# Patient Record
Sex: Female | Born: 1960
Health system: Southern US, Community
[De-identification: ages and names within clinical notes are randomized; demographics above are authoritative.]

## PROBLEM LIST (undated history)

## (undated) DIAGNOSIS — M1990 Unspecified osteoarthritis, unspecified site: Secondary | ICD-10-CM

## (undated) DIAGNOSIS — E041 Nontoxic single thyroid nodule: Secondary | ICD-10-CM

## (undated) DIAGNOSIS — G629 Polyneuropathy, unspecified: Secondary | ICD-10-CM

## (undated) DIAGNOSIS — H269 Unspecified cataract: Secondary | ICD-10-CM

## (undated) DIAGNOSIS — Z905 Acquired absence of kidney: Secondary | ICD-10-CM

## (undated) DIAGNOSIS — I739 Peripheral vascular disease, unspecified: Secondary | ICD-10-CM

## (undated) DIAGNOSIS — T8859XA Other complications of anesthesia, initial encounter: Secondary | ICD-10-CM

## (undated) DIAGNOSIS — Z972 Presence of dental prosthetic device (complete) (partial): Secondary | ICD-10-CM

## (undated) DIAGNOSIS — I509 Heart failure, unspecified: Secondary | ICD-10-CM

## (undated) DIAGNOSIS — C679 Malignant neoplasm of bladder, unspecified: Secondary | ICD-10-CM

## (undated) DIAGNOSIS — I70213 Atherosclerosis of native arteries of extremities with intermittent claudication, bilateral legs: Secondary | ICD-10-CM

## (undated) DIAGNOSIS — I2510 Atherosclerotic heart disease of native coronary artery without angina pectoris: Secondary | ICD-10-CM

## (undated) DIAGNOSIS — G709 Myoneural disorder, unspecified: Secondary | ICD-10-CM

## (undated) DIAGNOSIS — F419 Anxiety disorder, unspecified: Secondary | ICD-10-CM

## (undated) DIAGNOSIS — K649 Unspecified hemorrhoids: Secondary | ICD-10-CM

## (undated) DIAGNOSIS — E559 Vitamin D deficiency, unspecified: Secondary | ICD-10-CM

## (undated) DIAGNOSIS — K219 Gastro-esophageal reflux disease without esophagitis: Secondary | ICD-10-CM

## (undated) DIAGNOSIS — I10 Essential (primary) hypertension: Secondary | ICD-10-CM

## (undated) DIAGNOSIS — C649 Malignant neoplasm of unspecified kidney, except renal pelvis: Secondary | ICD-10-CM

## (undated) DIAGNOSIS — Z8489 Family history of other specified conditions: Secondary | ICD-10-CM

## (undated) DIAGNOSIS — R011 Cardiac murmur, unspecified: Secondary | ICD-10-CM

## (undated) HISTORY — PX: CARDIAC CATHETERIZATION: SHX172

## (undated) HISTORY — PX: TUBAL LIGATION: SHX77

## (undated) HISTORY — DX: Essential (primary) hypertension: I10

## (undated) HISTORY — DX: Vitamin D deficiency, unspecified: E55.9

## (undated) HISTORY — PX: AMPUTATION TOE: SHX6595

## (undated) HISTORY — DX: Peripheral vascular disease, unspecified: I73.9

## (undated) HISTORY — PX: EYE SURGERY: SHX253

## (undated) HISTORY — DX: Unspecified osteoarthritis, unspecified site: M19.90

## (undated) HISTORY — DX: Atherosclerosis of native arteries of extremities with intermittent claudication, bilateral legs: I70.213

## (undated) HISTORY — DX: Myoneural disorder, unspecified: G70.9

## (undated) HISTORY — PX: ARTERIAL BYPASS SURGRY: SHX557

## (undated) HISTORY — DX: Malignant neoplasm of unspecified kidney, except renal pelvis: C64.9

## (undated) HISTORY — DX: Unspecified cataract: H26.9

## (undated) HISTORY — DX: Unspecified hemorrhoids: K64.9

---

## 1999-01-25 LAB — HM DIABETES EYE EXAM

## 2005-07-07 DIAGNOSIS — Z8614 Personal history of Methicillin resistant Staphylococcus aureus infection: Secondary | ICD-10-CM

## 2005-07-07 HISTORY — DX: Personal history of Methicillin resistant Staphylococcus aureus infection: Z86.14

## 2007-07-08 HISTORY — PX: CORONARY ARTERY BYPASS GRAFT: SHX141

## 2008-07-13 ENCOUNTER — Ambulatory Visit: Payer: Self-pay | Admitting: Specialist

## 2008-07-17 ENCOUNTER — Ambulatory Visit: Payer: Self-pay | Admitting: Specialist

## 2008-07-19 ENCOUNTER — Encounter: Payer: Self-pay | Admitting: Internal Medicine

## 2008-07-25 ENCOUNTER — Ambulatory Visit: Payer: Self-pay | Admitting: Internal Medicine

## 2008-07-25 ENCOUNTER — Ambulatory Visit: Payer: Self-pay | Admitting: Specialist

## 2008-08-07 ENCOUNTER — Encounter: Payer: Self-pay | Admitting: Internal Medicine

## 2008-08-14 ENCOUNTER — Ambulatory Visit: Payer: Self-pay | Admitting: Family

## 2008-09-01 ENCOUNTER — Ambulatory Visit: Payer: Self-pay | Admitting: Specialist

## 2008-09-04 ENCOUNTER — Ambulatory Visit: Payer: Self-pay | Admitting: Family

## 2008-09-21 ENCOUNTER — Ambulatory Visit: Payer: Self-pay | Admitting: Family

## 2008-10-05 ENCOUNTER — Ambulatory Visit: Payer: Self-pay | Admitting: Family

## 2008-12-05 ENCOUNTER — Ambulatory Visit: Payer: Self-pay | Admitting: Family

## 2009-09-04 ENCOUNTER — Ambulatory Visit: Payer: Self-pay | Admitting: Vascular Surgery

## 2009-09-05 ENCOUNTER — Ambulatory Visit: Payer: Self-pay | Admitting: Surgery

## 2009-09-10 ENCOUNTER — Ambulatory Visit: Payer: Self-pay | Admitting: Surgery

## 2009-10-11 ENCOUNTER — Ambulatory Visit: Payer: Self-pay | Admitting: Vascular Surgery

## 2010-04-02 ENCOUNTER — Ambulatory Visit: Payer: Self-pay | Admitting: Vascular Surgery

## 2010-04-09 LAB — CBC AND DIFFERENTIAL
HCT: 37 % (ref 36–46)
Hemoglobin: 12.5 g/dL (ref 12.0–16.0)
Neutrophils Absolute: 7 /uL
Platelets: 345 10*3/uL (ref 150–399)

## 2010-04-23 ENCOUNTER — Ambulatory Visit: Payer: Self-pay | Admitting: Vascular Surgery

## 2010-05-28 ENCOUNTER — Ambulatory Visit: Payer: Self-pay | Admitting: Vascular Surgery

## 2010-06-04 ENCOUNTER — Ambulatory Visit: Payer: Self-pay | Admitting: Internal Medicine

## 2011-02-25 ENCOUNTER — Telehealth: Payer: Self-pay | Admitting: Internal Medicine

## 2011-02-25 NOTE — Telephone Encounter (Signed)
I called patient and verified that she test her blood sugar 2-3 times a day.  I have also called [facility] pharmacy to notify them.

## 2011-03-14 ENCOUNTER — Other Ambulatory Visit: Payer: Self-pay | Admitting: Internal Medicine

## 2011-03-18 ENCOUNTER — Other Ambulatory Visit: Payer: Self-pay | Admitting: Internal Medicine

## 2011-04-14 ENCOUNTER — Other Ambulatory Visit: Payer: Self-pay | Admitting: Internal Medicine

## 2011-04-29 ENCOUNTER — Ambulatory Visit (INDEPENDENT_AMBULATORY_CARE_PROVIDER_SITE_OTHER): Payer: Medicare Other | Admitting: Internal Medicine

## 2011-04-29 ENCOUNTER — Encounter: Payer: Self-pay | Admitting: Internal Medicine

## 2011-04-29 VITALS — BP 176/80 | HR 81 | Temp 98.2°F | Resp 16 | Ht 66.0 in | Wt 175.8 lb

## 2011-04-29 DIAGNOSIS — E1151 Type 2 diabetes mellitus with diabetic peripheral angiopathy without gangrene: Secondary | ICD-10-CM | POA: Insufficient documentation

## 2011-04-29 DIAGNOSIS — Z720 Tobacco use: Secondary | ICD-10-CM

## 2011-04-29 DIAGNOSIS — I739 Peripheral vascular disease, unspecified: Secondary | ICD-10-CM

## 2011-04-29 DIAGNOSIS — I10 Essential (primary) hypertension: Secondary | ICD-10-CM | POA: Insufficient documentation

## 2011-04-29 DIAGNOSIS — E785 Hyperlipidemia, unspecified: Secondary | ICD-10-CM

## 2011-04-29 DIAGNOSIS — Z1211 Encounter for screening for malignant neoplasm of colon: Secondary | ICD-10-CM

## 2011-04-29 DIAGNOSIS — E08311 Diabetes mellitus due to underlying condition with unspecified diabetic retinopathy with macular edema: Secondary | ICD-10-CM

## 2011-04-29 DIAGNOSIS — E119 Type 2 diabetes mellitus without complications: Secondary | ICD-10-CM

## 2011-04-29 DIAGNOSIS — K649 Unspecified hemorrhoids: Secondary | ICD-10-CM | POA: Insufficient documentation

## 2011-04-29 DIAGNOSIS — Z87891 Personal history of nicotine dependence: Secondary | ICD-10-CM | POA: Insufficient documentation

## 2011-04-29 MED ORDER — INSULIN PEN NEEDLE 31G X 8 MM MISC: Status: DC

## 2011-04-29 MED ORDER — CARVEDILOL 6.25 MG PO TABS: 6.2500 mg | ORAL_TABLET | Freq: Two times a day (BID) | ORAL | Status: DC

## 2011-04-29 MED ORDER — INSULIN NPH ISOPHANE & REGULAR (70-30) 100 UNIT/ML ~~LOC~~ SUSP: 10.0000 [IU] | Freq: Two times a day (BID) | SUBCUTANEOUS | Status: DC

## 2011-04-29 NOTE — Assessment & Plan Note (Addendum)
S/p laser treatments and injectiosn by [facility]eller,  F.u nov 16th.  Sees Dr Al Corpus for podiatry oct 31 /  Regimen changed to 70/30 bid .  Continue levemir.

## 2011-04-29 NOTE — Progress Notes (Signed)
  Subjective:    Patient ID: Laura Reed, female    DOB: September 10, 1960, 50 y.o.   MRN: [id number]  HPI  50 yo white female with history of PAD, CAD m tobacco abuse and DM here for diabetes check. Since she stopped smoking she has been eating more and her sugars have become uncontrolled.   She has been using regular insulin tid and daily levemir and has had sugars ranging from 140 to 180.     Current Outpatient Prescriptions on File Prior to Visit  Medication Sig Dispense Refill  . ALPRAZolam (XANAX) 0.5 MG tablet TAKE 1 TABLET BY MOUTH DAILY AS NEEDED FOR ANXIETY  30 tablet  3  . atorvastatin (LIPITOR) 40 MG tablet TAKE ONE TABLET BY MOUTH DAILY  30 tablet  3  . clopidogrel (PLAVIX) 75 MG tablet TAKE 1 TABLET BY MOUTH EVERY DAY  30 tablet  6  . lisinopril (PRINIVIL,ZESTRIL) 5 MG tablet TAKE 1 TABLET BY MOUTH DAILY  30 tablet  5   .    Review of Systems     Objective:   Physical Exam        Assessment & Plan:

## 2011-04-29 NOTE — Patient Instructions (Addendum)
We are changing your insulin from regular to 70/30 to reduce the number of injections.  Please follow this insulin schedule:  Breakfast:  10 units of 70/30.  Add 5 units for fasting bs > 150 OR a breakfast of cereal/pancakes/muffins/bagels   Dinner :  10 units of 70/30.  Add 5 units for pre dinner sugar > 150 OR  DINNER CONTAINING PASTA, OR POTATOES  Continue the Levemir 30 units at bedtime.    Look for Laura Reed's brand of pita bread and flatbread at [employer] and at [facility]  4 net carbs/serving!!

## 2011-04-30 ENCOUNTER — Encounter: Payer: Self-pay | Admitting: Internal Medicine

## 2011-04-30 DIAGNOSIS — I739 Peripheral vascular disease, unspecified: Secondary | ICD-10-CM | POA: Insufficient documentation

## 2011-04-30 NOTE — Assessment & Plan Note (Signed)
She quit again in September after a brief relapse.  Encouragement given , risks of recurrent PAD again reiterated.

## 2011-04-30 NOTE — Assessment & Plan Note (Signed)
With prior vascular intervention bu Dr. Earnestine Leys. . She is being referred to Dr. Gilda Crease for followup.

## 2011-05-08 ENCOUNTER — Other Ambulatory Visit (INDEPENDENT_AMBULATORY_CARE_PROVIDER_SITE_OTHER): Payer: Medicare Other | Admitting: *Deleted

## 2011-05-08 DIAGNOSIS — E785 Hyperlipidemia, unspecified: Secondary | ICD-10-CM

## 2011-05-08 DIAGNOSIS — E119 Type 2 diabetes mellitus without complications: Secondary | ICD-10-CM

## 2011-05-08 LAB — MICROALBUMIN / CREATININE URINE RATIO: Microalb Creat Ratio: 4.5 mg/g (ref 0.0–30.0)

## 2011-05-08 LAB — COMPREHENSIVE METABOLIC PANEL
AST: 15 U/L (ref 0–37)
Albumin: 3.6 g/dL (ref 3.5–5.2)
BUN: 21 mg/dL (ref 6–23)
CO2: 27 mEq/L (ref 19–32)
Calcium: 9 mg/dL (ref 8.4–10.5)
Chloride: 102 mEq/L (ref 96–112)
Creatinine, Ser: 0.8 mg/dL (ref 0.4–1.2)
GFR: 78.72 mL/min (ref >60.00)
Glucose, Bld: 273 mg/dL — ABNORMAL HIGH (ref 70–99)
Potassium: 4.5 mEq/L (ref 3.5–5.1)

## 2011-05-08 LAB — LIPID PANEL
Cholesterol: 128 mg/dL (ref 0–200)
Triglycerides: 134 mg/dL (ref 0.0–149.0)

## 2011-05-08 LAB — HEMOGLOBIN A1C: Hgb A1c MFr Bld: 10.7 % — ABNORMAL HIGH (ref 4.6–6.5)

## 2011-05-20 ENCOUNTER — Telehealth: Payer: Self-pay | Admitting: Internal Medicine

## 2011-05-20 NOTE — Telephone Encounter (Signed)
Chart opened in error

## 2011-07-10 ENCOUNTER — Ambulatory Visit: Payer: Self-pay | Admitting: Internal Medicine

## 2011-07-14 ENCOUNTER — Telehealth: Payer: Self-pay | Admitting: Internal Medicine

## 2011-07-14 NOTE — Telephone Encounter (Signed)
Patient was referred to Dr. Vertis Kelch for a black hole in bottom of foot patient is a diabetic .Patient was seen on 1.7.13 .

## 2011-07-17 LAB — HM MAMMOGRAPHY: HM Mammogram: NORMAL

## 2011-07-18 ENCOUNTER — Inpatient Hospital Stay: Payer: Self-pay | Admitting: Vascular Surgery

## 2011-07-18 LAB — BASIC METABOLIC PANEL
Calcium, Total: 8.8 mg/dL (ref 8.5–10.1)
Chloride: 103 mmol/L (ref 98–107)
Co2: 29 mmol/L (ref 21–32)
EGFR (African American): >60
Glucose: 168 mg/dL — ABNORMAL HIGH (ref 65–99)
Osmolality: 288 (ref 275–301)
Potassium: 5 mmol/L (ref 3.5–5.1)
Sodium: 140 mmol/L (ref 136–145)

## 2011-07-19 LAB — CBC WITH DIFFERENTIAL/PLATELET
Basophil #: 0 10*3/uL (ref 0.0–0.1)
Eosinophil #: 0.2 10*3/uL (ref 0.0–0.7)
HCT: 32.5 % — ABNORMAL LOW (ref 35.0–47.0)
Lymphocyte #: 1.6 10*3/uL (ref 1.0–3.6)
MCH: 29.6 pg (ref 26.0–34.0)
MCHC: 34 g/dL (ref 32.0–36.0)
MCV: 87 fL (ref 80–100)
Neutrophil #: 11.5 10*3/uL — ABNORMAL HIGH (ref 1.4–6.5)
Neutrophil %: 78.4 %
RBC: 3.74 10*6/uL — ABNORMAL LOW (ref 3.80–5.20)
RDW: 12.8 % (ref 11.5–14.5)
WBC: 14.6 10*3/uL — ABNORMAL HIGH (ref 3.6–11.0)

## 2011-07-21 LAB — CBC WITH DIFFERENTIAL/PLATELET
Basophil #: 0.1 10*3/uL (ref 0.0–0.1)
Eosinophil #: 0.2 10*3/uL (ref 0.0–0.7)
HGB: 10.8 g/dL — ABNORMAL LOW (ref 12.0–16.0)
Lymphocyte #: 2.3 10*3/uL (ref 1.0–3.6)
Lymphocyte %: 17.5 %
MCHC: 32.9 g/dL (ref 32.0–36.0)
MCV: 88 fL (ref 80–100)
Monocyte #: 1.4 10*3/uL — ABNORMAL HIGH (ref 0.0–0.7)
Monocyte %: 10.4 %
Platelet: 336 10*3/uL (ref 150–440)
RBC: 3.74 10*6/uL — ABNORMAL LOW (ref 3.80–5.20)
RDW: 13 % (ref 11.5–14.5)

## 2011-07-21 LAB — HEMOGLOBIN A1C: Hemoglobin A1C: 11.2 % — ABNORMAL HIGH (ref 4.2–6.3)

## 2011-07-22 LAB — BASIC METABOLIC PANEL
BUN: 11 mg/dL (ref 7–18)
Chloride: 103 mmol/L (ref 98–107)
EGFR (African American): >60
EGFR (Non-African Amer.): >60
Glucose: 86 mg/dL (ref 65–99)
Potassium: 4.2 mmol/L (ref 3.5–5.1)
Sodium: 143 mmol/L (ref 136–145)

## 2011-07-22 LAB — CBC WITH DIFFERENTIAL/PLATELET
Basophil %: 0.7 %
Eosinophil %: 1.8 %
HGB: 10.9 g/dL — ABNORMAL LOW (ref 12.0–16.0)
Lymphocyte #: 2 10*3/uL (ref 1.0–3.6)
Lymphocyte %: 17.7 %
MCH: 28.9 pg (ref 26.0–34.0)
MCV: 86 fL (ref 80–100)
Monocyte #: 1.1 10*3/uL — ABNORMAL HIGH (ref 0.0–0.7)
Monocyte %: 9.4 %
Neutrophil #: 8.1 10*3/uL — ABNORMAL HIGH (ref 1.4–6.5)
Neutrophil %: 70.4 %
Platelet: 353 10*3/uL (ref 150–440)
RBC: 3.76 10*6/uL — ABNORMAL LOW (ref 3.80–5.20)
WBC: 11.5 10*3/uL — ABNORMAL HIGH (ref 3.6–11.0)

## 2011-07-23 ENCOUNTER — Encounter: Payer: Self-pay | Admitting: Internal Medicine

## 2011-07-24 LAB — CBC WITH DIFFERENTIAL/PLATELET
Basophil %: 0.5 %
Eosinophil #: 0.2 10*3/uL (ref 0.0–0.7)
Eosinophil %: 1.7 %
HGB: 11.2 g/dL — ABNORMAL LOW (ref 12.0–16.0)
Lymphocyte #: 1.4 10*3/uL (ref 1.0–3.6)
MCH: 29 pg (ref 26.0–34.0)
MCHC: 33.2 g/dL (ref 32.0–36.0)
Monocyte #: 0.8 10*3/uL — ABNORMAL HIGH (ref 0.0–0.7)
Neutrophil %: 77.2 %
RBC: 3.87 10*6/uL (ref 3.80–5.20)
RDW: 12.9 % (ref 11.5–14.5)

## 2011-07-25 LAB — CBC WITH DIFFERENTIAL/PLATELET
Basophil #: 0 10*3/uL (ref 0.0–0.1)
Basophil %: 0.4 %
Eosinophil #: 0.2 10*3/uL (ref 0.0–0.7)
Eosinophil %: 2 %
HGB: 11.3 g/dL — ABNORMAL LOW (ref 12.0–16.0)
Lymphocyte #: 1.6 10*3/uL (ref 1.0–3.6)
Lymphocyte %: 15.6 %
MCH: 28.8 pg (ref 26.0–34.0)
MCHC: 33.5 g/dL (ref 32.0–36.0)
MCV: 86 fL (ref 80–100)
Monocyte #: 0.9 10*3/uL — ABNORMAL HIGH (ref 0.0–0.7)
Neutrophil %: 73.6 %
RBC: 3.93 10*6/uL (ref 3.80–5.20)

## 2011-07-26 LAB — CBC WITH DIFFERENTIAL/PLATELET
Basophil #: 0 10*3/uL (ref 0.0–0.1)
Eosinophil #: 0.1 10*3/uL (ref 0.0–0.7)
Eosinophil %: 1 %
HCT: 28.8 % — ABNORMAL LOW (ref 35.0–47.0)
Lymphocyte %: 10.8 %
MCHC: 33.6 g/dL (ref 32.0–36.0)
Monocyte %: 9.4 %
Neutrophil #: 9.6 10*3/uL — ABNORMAL HIGH (ref 1.4–6.5)
Platelet: 359 10*3/uL (ref 150–440)
RBC: 3.3 10*6/uL — ABNORMAL LOW (ref 3.80–5.20)
RDW: 12.9 % (ref 11.5–14.5)

## 2011-07-26 LAB — BASIC METABOLIC PANEL
Anion Gap: 6 — ABNORMAL LOW (ref 7–16)
Chloride: 102 mmol/L (ref 98–107)
EGFR (African American): >60
Glucose: 166 mg/dL — ABNORMAL HIGH (ref 65–99)
Osmolality: 278 (ref 275–301)
Sodium: 137 mmol/L (ref 136–145)

## 2011-07-26 LAB — APTT: Activated PTT: 33.1 secs (ref 23.6–35.9)

## 2011-07-27 LAB — CBC WITH DIFFERENTIAL/PLATELET
Basophil #: 0.1 10*3/uL (ref 0.0–0.1)
Eosinophil %: 0.9 %
HGB: 8.1 g/dL — ABNORMAL LOW (ref 12.0–16.0)
Lymphocyte #: 1.2 10*3/uL (ref 1.0–3.6)
Monocyte #: 1.7 10*3/uL — ABNORMAL HIGH (ref 0.0–0.7)
Monocyte %: 11.6 %
Neutrophil #: 11.3 10*3/uL — ABNORMAL HIGH (ref 1.4–6.5)
Platelet: 294 10*3/uL (ref 150–440)
RBC: 2.79 10*6/uL — ABNORMAL LOW (ref 3.80–5.20)
RDW: 12.9 % (ref 11.5–14.5)
WBC: 14.4 10*3/uL — ABNORMAL HIGH (ref 3.6–11.0)

## 2011-07-27 LAB — BASIC METABOLIC PANEL
Calcium, Total: 7.9 mg/dL — ABNORMAL LOW (ref 8.5–10.1)
Chloride: 101 mmol/L (ref 98–107)
Co2: 28 mmol/L (ref 21–32)
Creatinine: 1.17 mg/dL (ref 0.60–1.30)
EGFR (Non-African Amer.): 52 — ABNORMAL LOW
Osmolality: 285 (ref 275–301)
Potassium: 4.9 mmol/L (ref 3.5–5.1)
Sodium: 139 mmol/L (ref 136–145)

## 2011-07-28 LAB — CBC WITH DIFFERENTIAL/PLATELET
Basophil #: 0.1 10*3/uL (ref 0.0–0.1)
Basophil %: 0.4 %
Eosinophil #: 0.2 10*3/uL (ref 0.0–0.7)
MCH: 29.5 pg (ref 26.0–34.0)
MCHC: 34.3 g/dL (ref 32.0–36.0)
Monocyte #: 1.4 10*3/uL — ABNORMAL HIGH (ref 0.0–0.7)
Monocyte %: 10.1 %
Neutrophil #: 10.7 10*3/uL — ABNORMAL HIGH (ref 1.4–6.5)
Neutrophil %: 77.5 %
Platelet: 311 10*3/uL (ref 150–440)
RBC: 2.84 10*6/uL — ABNORMAL LOW (ref 3.80–5.20)
RDW: 13.1 % (ref 11.5–14.5)

## 2011-07-28 LAB — BASIC METABOLIC PANEL
BUN: 24 mg/dL — ABNORMAL HIGH (ref 7–18)
Calcium, Total: 8.5 mg/dL (ref 8.5–10.1)
Chloride: 97 mmol/L — ABNORMAL LOW (ref 98–107)
Osmolality: 271 (ref 275–301)
Potassium: 4.4 mmol/L (ref 3.5–5.1)

## 2011-08-13 ENCOUNTER — Telehealth: Payer: Self-pay | Admitting: *Deleted

## 2011-08-13 NOTE — Telephone Encounter (Signed)
Prior Laura Reed is needed for levemir and novolog pens, forms are in red folder.

## 2011-08-14 ENCOUNTER — Emergency Department: Payer: Self-pay | Admitting: Emergency Medicine

## 2011-08-14 LAB — COMPREHENSIVE METABOLIC PANEL
Anion Gap: 9 (ref 7–16)
BUN: 47 mg/dL — ABNORMAL HIGH (ref 7–18)
Bilirubin,Total: 0.2 mg/dL (ref 0.2–1.0)
Calcium, Total: 8.9 mg/dL (ref 8.5–10.1)
Chloride: 104 mmol/L (ref 98–107)
EGFR (African American): >60
Glucose: 32 mg/dL — CL (ref 65–99)
Potassium: 4.3 mmol/L (ref 3.5–5.1)
SGOT(AST): 18 U/L (ref 15–37)
SGPT (ALT): 25 U/L
Total Protein: 7.6 g/dL (ref 6.4–8.2)

## 2011-08-14 LAB — CBC
HCT: 30.2 % — ABNORMAL LOW (ref 35.0–47.0)
MCH: 29.3 pg (ref 26.0–34.0)
MCV: 87 fL (ref 80–100)
RBC: 3.49 10*6/uL — ABNORMAL LOW (ref 3.80–5.20)
RDW: 13.7 % (ref 11.5–14.5)
WBC: 14.8 10*3/uL — ABNORMAL HIGH (ref 3.6–11.0)

## 2011-08-14 LAB — URINALYSIS, COMPLETE
Bacteria: NONE SEEN
Bilirubin,UR: NEGATIVE
Glucose,UR: 50 mg/dL (ref 0–75)
Ketone: NEGATIVE
Nitrite: NEGATIVE
RBC,UR: 1 /HPF (ref 0–5)
Squamous Epithelial: 2

## 2011-08-14 LAB — TROPONIN I: Troponin-I: <0.02 ng/mL

## 2011-08-18 ENCOUNTER — Other Ambulatory Visit: Payer: Self-pay | Admitting: *Deleted

## 2011-08-18 MED ORDER — GLUCOSE BLOOD VI STRP: ORAL_STRIP | Status: DC

## 2011-08-20 ENCOUNTER — Telehealth: Payer: Self-pay | Admitting: *Deleted

## 2011-08-20 NOTE — Telephone Encounter (Signed)
To: [employer] (Daytime Triage) Fax: [phone] From: [facility] Date/ Time: 08/20/2011 10:15 AM Taken By: Annalee Genta, CSR Caller: Eber Jones Facility: not collected Patient: Laura Reed, Laura Reed DOB: [birth year] Phone: [phone] Reason for Call: Pt requests nurse call [facility] office at #[phone] re form for test strips. She advises they should have faxed form to office. Please call pt with any questions. Regarding Appointment: Appt Date: Appt Time: Unknown Provider: Reason: Details: Outcome:

## 2011-08-20 NOTE — Telephone Encounter (Signed)
Forms received from insurance company stating prior auths arent needed on insulin pens at this time.

## 2011-08-22 ENCOUNTER — Other Ambulatory Visit: Payer: Self-pay | Admitting: Internal Medicine

## 2011-08-26 ENCOUNTER — Telehealth: Payer: Self-pay | Admitting: *Deleted

## 2011-08-26 NOTE — Telephone Encounter (Signed)
[facility] has faxed a form for patient's diabetic supplies, form is in red folder.

## 2011-08-27 NOTE — Telephone Encounter (Signed)
Form completed, faxed back to [facility].

## 2011-08-28 NOTE — Telephone Encounter (Signed)
We have received form. See other phone note.

## 2011-09-03 ENCOUNTER — Other Ambulatory Visit: Payer: Self-pay | Admitting: Internal Medicine

## 2011-09-03 NOTE — Telephone Encounter (Signed)
thr refill request had a duplicate alprazolam request which I did not want to authorize, just one refill .  The atorvastatin can be refilled

## 2011-09-11 ENCOUNTER — Encounter: Payer: Self-pay | Admitting: Internal Medicine

## 2011-09-12 ENCOUNTER — Other Ambulatory Visit: Payer: Self-pay | Admitting: *Deleted

## 2011-09-12 MED ORDER — DIGOXIN 125 MCG PO TABS: 125.0000 ug | ORAL_TABLET | Freq: Every day | ORAL | Status: DC

## 2011-10-02 ENCOUNTER — Other Ambulatory Visit: Payer: Self-pay | Admitting: Internal Medicine

## 2011-10-02 ENCOUNTER — Other Ambulatory Visit: Payer: Self-pay | Admitting: *Deleted

## 2011-10-02 MED ORDER — LISINOPRIL 5 MG PO TABS: 5.0000 mg | ORAL_TABLET | Freq: Every day | ORAL | Status: DC

## 2011-10-09 ENCOUNTER — Encounter: Payer: Self-pay | Admitting: Internal Medicine

## 2011-10-30 ENCOUNTER — Other Ambulatory Visit: Payer: Self-pay | Admitting: Internal Medicine

## 2011-10-30 MED ORDER — LANCETS MISC: Status: DC

## 2011-11-05 ENCOUNTER — Other Ambulatory Visit: Payer: Self-pay | Admitting: Internal Medicine

## 2011-11-06 NOTE — Telephone Encounter (Signed)
Done

## 2011-11-17 ENCOUNTER — Other Ambulatory Visit: Payer: Self-pay | Admitting: Internal Medicine

## 2011-11-17 DIAGNOSIS — I10 Essential (primary) hypertension: Secondary | ICD-10-CM

## 2011-11-17 MED ORDER — CARVEDILOL 6.25 MG PO TABS: 6.2500 mg | ORAL_TABLET | Freq: Two times a day (BID) | ORAL | Status: DC

## 2011-11-27 ENCOUNTER — Ambulatory Visit: Payer: Self-pay | Admitting: Physician Assistant

## 2011-11-27 LAB — CREATININE, SERUM
Creatinine: 0.75 mg/dL (ref 0.60–1.30)
EGFR (African American): >60

## 2011-12-09 ENCOUNTER — Ambulatory Visit: Payer: Self-pay | Admitting: Vascular Surgery

## 2011-12-09 LAB — BASIC METABOLIC PANEL
BUN: 34 mg/dL — ABNORMAL HIGH (ref 7–18)
Calcium, Total: 8.5 mg/dL (ref 8.5–10.1)
Creatinine: 0.88 mg/dL (ref 0.60–1.30)
EGFR (African American): >60
EGFR (Non-African Amer.): >60
Potassium: 4.1 mmol/L (ref 3.5–5.1)
Sodium: 141 mmol/L (ref 136–145)

## 2011-12-15 ENCOUNTER — Other Ambulatory Visit: Payer: Self-pay | Admitting: Internal Medicine

## 2011-12-15 DIAGNOSIS — Z1211 Encounter for screening for malignant neoplasm of colon: Secondary | ICD-10-CM

## 2011-12-17 ENCOUNTER — Encounter: Payer: Self-pay | Admitting: Internal Medicine

## 2011-12-17 ENCOUNTER — Other Ambulatory Visit: Payer: Self-pay | Admitting: Internal Medicine

## 2011-12-18 NOTE — Telephone Encounter (Signed)
Rx called to [facility] pharmacy. 

## 2011-12-19 ENCOUNTER — Ambulatory Visit: Payer: Self-pay | Admitting: General Surgery

## 2011-12-23 ENCOUNTER — Ambulatory Visit: Payer: Self-pay | Admitting: Vascular Surgery

## 2011-12-23 LAB — CBC
HGB: 12.9 g/dL (ref 12.0–16.0)
MCH: 28.1 pg (ref 26.0–34.0)
MCHC: 33.3 g/dL (ref 32.0–36.0)
MCV: 85 fL (ref 80–100)
Platelet: 295 10*3/uL (ref 150–440)
RBC: 4.57 10*6/uL (ref 3.80–5.20)
WBC: 11.4 10*3/uL — ABNORMAL HIGH (ref 3.6–11.0)

## 2011-12-23 LAB — BASIC METABOLIC PANEL
Anion Gap: 4 — ABNORMAL LOW (ref 7–16)
Calcium, Total: 8.9 mg/dL (ref 8.5–10.1)
Chloride: 104 mmol/L (ref 98–107)
EGFR (African American): >60
EGFR (Non-African Amer.): >60
Potassium: 4.6 mmol/L (ref 3.5–5.1)
Sodium: 137 mmol/L (ref 136–145)

## 2011-12-31 ENCOUNTER — Inpatient Hospital Stay: Payer: Self-pay | Admitting: Vascular Surgery

## 2011-12-31 ENCOUNTER — Ambulatory Visit: Payer: Medicare Other | Admitting: Internal Medicine

## 2012-01-01 LAB — PROTIME-INR: INR: 1

## 2012-01-01 LAB — CBC WITH DIFFERENTIAL/PLATELET
Basophil %: 0.5 %
Eosinophil %: 1 %
HCT: 33.2 % — ABNORMAL LOW (ref 35.0–47.0)
Lymphocyte #: 1.8 10*3/uL (ref 1.0–3.6)
MCH: 28.5 pg (ref 26.0–34.0)
MCV: 85 fL (ref 80–100)
Monocyte %: 7.3 %
Neutrophil #: 11.3 10*3/uL — ABNORMAL HIGH (ref 1.4–6.5)
RBC: 3.9 10*6/uL (ref 3.80–5.20)
RDW: 13.8 % (ref 11.5–14.5)

## 2012-01-01 LAB — APTT: Activated PTT: 26.2 secs (ref 23.6–35.9)

## 2012-01-01 LAB — BASIC METABOLIC PANEL
Calcium, Total: 8.1 mg/dL — ABNORMAL LOW (ref 8.5–10.1)
Chloride: 109 mmol/L — ABNORMAL HIGH (ref 98–107)
Co2: 27 mmol/L (ref 21–32)
Creatinine: 0.61 mg/dL (ref 0.60–1.30)
EGFR (African American): >60
EGFR (Non-African Amer.): >60
Glucose: 129 mg/dL — ABNORMAL HIGH (ref 65–99)
Osmolality: 289 (ref 275–301)
Potassium: 4.3 mmol/L (ref 3.5–5.1)
Sodium: 143 mmol/L (ref 136–145)

## 2012-01-02 LAB — BASIC METABOLIC PANEL
Anion Gap: 5 — ABNORMAL LOW (ref 7–16)
BUN: 16 mg/dL (ref 7–18)
Calcium, Total: 8.1 mg/dL — ABNORMAL LOW (ref 8.5–10.1)
Creatinine: 0.72 mg/dL (ref 0.60–1.30)
EGFR (African American): >60
EGFR (Non-African Amer.): >60

## 2012-01-02 LAB — CBC WITH DIFFERENTIAL/PLATELET
Basophil #: 0 10*3/uL (ref 0.0–0.1)
Basophil %: 0.2 %
Eosinophil %: 1.3 %
Lymphocyte %: 11.5 %
MCHC: 33.1 g/dL (ref 32.0–36.0)
Monocyte %: 9.5 %
Neutrophil %: 77.5 %
Platelet: 237 10*3/uL (ref 150–440)
WBC: 13.1 10*3/uL — ABNORMAL HIGH (ref 3.6–11.0)

## 2012-01-02 LAB — PATHOLOGY REPORT

## 2012-01-04 ENCOUNTER — Other Ambulatory Visit: Payer: Self-pay | Admitting: Internal Medicine

## 2012-01-06 ENCOUNTER — Other Ambulatory Visit: Payer: Self-pay | Admitting: *Deleted

## 2012-01-06 MED ORDER — LISINOPRIL 5 MG PO TABS: 5.0000 mg | ORAL_TABLET | Freq: Every day | ORAL | Status: DC

## 2012-01-19 ENCOUNTER — Other Ambulatory Visit: Payer: Self-pay | Admitting: Internal Medicine

## 2012-01-20 NOTE — Telephone Encounter (Signed)
Rx called to [facility] pharmacy. 

## 2012-02-09 ENCOUNTER — Other Ambulatory Visit: Payer: Self-pay | Admitting: *Deleted

## 2012-02-09 MED ORDER — INSULIN DETEMIR 100 UNIT/ML ~~LOC~~ SOLN: 30.0000 [IU] | Freq: Every day | SUBCUTANEOUS | Status: DC

## 2012-02-24 ENCOUNTER — Other Ambulatory Visit: Payer: Self-pay | Admitting: Internal Medicine

## 2012-02-24 DIAGNOSIS — I10 Essential (primary) hypertension: Secondary | ICD-10-CM

## 2012-02-24 MED ORDER — CARVEDILOL 6.25 MG PO TABS: 6.2500 mg | ORAL_TABLET | Freq: Two times a day (BID) | ORAL | Status: DC

## 2012-03-03 HISTORY — PX: CHOLECYSTECTOMY: SHX55

## 2012-03-16 ENCOUNTER — Encounter: Payer: Self-pay | Admitting: Internal Medicine

## 2012-03-16 ENCOUNTER — Ambulatory Visit (INDEPENDENT_AMBULATORY_CARE_PROVIDER_SITE_OTHER): Payer: Medicare Other | Admitting: Internal Medicine

## 2012-03-16 VITALS — BP 130/60 | HR 80 | Temp 97.7°F | Resp 16 | Wt 167.5 lb

## 2012-03-16 DIAGNOSIS — E1139 Type 2 diabetes mellitus with other diabetic ophthalmic complication: Secondary | ICD-10-CM

## 2012-03-16 DIAGNOSIS — I10 Essential (primary) hypertension: Secondary | ICD-10-CM

## 2012-03-16 DIAGNOSIS — Z720 Tobacco use: Secondary | ICD-10-CM

## 2012-03-16 DIAGNOSIS — E1339 Other specified diabetes mellitus with other diabetic ophthalmic complication: Secondary | ICD-10-CM

## 2012-03-16 DIAGNOSIS — F17200 Nicotine dependence, unspecified, uncomplicated: Secondary | ICD-10-CM

## 2012-03-16 DIAGNOSIS — H579 Unspecified disorder of eye and adnexa: Secondary | ICD-10-CM

## 2012-03-16 DIAGNOSIS — I739 Peripheral vascular disease, unspecified: Secondary | ICD-10-CM

## 2012-03-16 DIAGNOSIS — Z1211 Encounter for screening for malignant neoplasm of colon: Secondary | ICD-10-CM

## 2012-03-16 DIAGNOSIS — E119 Type 2 diabetes mellitus without complications: Secondary | ICD-10-CM

## 2012-03-16 DIAGNOSIS — E785 Hyperlipidemia, unspecified: Secondary | ICD-10-CM

## 2012-03-16 DIAGNOSIS — E08311 Diabetes mellitus due to underlying condition with unspecified diabetic retinopathy with macular edema: Secondary | ICD-10-CM

## 2012-03-16 DIAGNOSIS — Z23 Encounter for immunization: Secondary | ICD-10-CM

## 2012-03-16 DIAGNOSIS — E11311 Type 2 diabetes mellitus with unspecified diabetic retinopathy with macular edema: Secondary | ICD-10-CM

## 2012-03-16 DIAGNOSIS — E11319 Type 2 diabetes mellitus with unspecified diabetic retinopathy without macular edema: Secondary | ICD-10-CM

## 2012-03-16 MED ORDER — CARVEDILOL 6.25 MG PO TABS: 6.2500 mg | ORAL_TABLET | Freq: Two times a day (BID) | ORAL | Status: DC

## 2012-03-16 MED ORDER — ALPRAZOLAM 0.5 MG PO TABS: 0.5000 mg | ORAL_TABLET | Freq: Three times a day (TID) | ORAL | Status: DC | PRN

## 2012-03-16 NOTE — Progress Notes (Signed)
Patient ID: Laura Reed, female   DOB: [birth year], 51 y.o.   MRN: [id number]   Patient Active Problem List  Diagnosis  . Tobacco abuse  . 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 Hypertension  . Hemorrhoid  . PVD (peripheral vascular disease)    Subjective:  CC:   Chief Complaint  Patient presents with  . Diabetes    follow up    HPI:   Laura Reed a 51 y.o. female who presents diabetes followup  .  Since her last visit she has had several  vascular surgeries by Dr. Gilda Crease on both legs.  She was hospitalized in January for gangrene of the right foot and underwent amputation of the fourth and fifth toes on the right foot and extensive revascularization by Dr. Gilda Crease on the right side. She had a second vascular suergy 3 months ago for stenosis on the left side. She feels better and she has a long time. She has good blood flow to both feet both feet are her also suffering from neuropathy but she denies any current pain issues. She has been Following  A low carb high protein diet with resulting improvement in management of diabetes. She's also been smoke-free since her hospitalization.   Past Medical History  Diagnosis Date  . Diabetes mellitus   . Hypertension   . Hemorrhoid   . PVD (peripheral vascular disease)     Past Surgical History  Procedure Date  . Coronary artery bypass graft 2009    3 vessel  . Arterial bypass surgry 2009, 2013 x 2    right leg , done in [place]  . Cesarean section          The following portions of the patient's history were reviewed and updated as appropriate: Allergies, current medications, and problem list.    Review of Systems:   12 Pt  review of systems was negative except those addressed in the HPI,     History   Social History  . Marital Status: Legally Separated    Spouse Name: N/A    Number of Children: N/A  . Years of Education: N/A   Occupational History   . Not on file.   Social History Main Topics  . Smoking status: Former Smoker    Types: Cigarettes    Quit date: 03/30/2011  . Smokeless tobacco: Never Used  . Alcohol Use: No  . Drug Use: No  . Sexually Active: Not on file   Other Topics Concern  . Not on file   Social History Narrative  . No narrative on file    Objective:  BP 130/60  Pulse 80  Temp 97.7 F (36.5 C) (Oral)  Resp 16  Wt 167 lb 8 oz (75.978 kg)  SpO2 98%  General appearance: alert, cooperative and appears stated age Ears: normal TM's and external ear canals both ears Throat: lips, mucosa, and tongue normal; teeth and gums normal Neck: no adenopathy, no carotid bruit, supple, symmetrical, trachea midline and thyroid not enlarged, symmetric, no tenderness/mass/nodules Back: symmetric, no curvature. ROM normal. No CVA tenderness. Lungs: clear to auscultation bilaterally Heart: regular rate and rhythm, S1, S2 normal, no murmur, click, rub or gallop Abdomen: soft, non-tender; bowel sounds normal; no masses,  no organomegaly Pulses: 2+ and symmetric Skin: Skin color, texture, turgor normal. No rashes or lesions Lymph nodes: Cervical, supraclavicular, and axillary nodes normal.  Assessment and Plan:  Diabetes mellitus due to underlying condition  with diabetic retinopathy with macular edema With prior laser treatments and injections by Dr. Champ Mungo. She also has a complications of neuropathy and peripheral vascular disease. She is now following a low carbohydrate diet and using Levemir twice daily. Her sugars have been very well controlled and her hemoglobin A1c is due.  Tobacco abuse She has been smoke free since September.  PVD (peripheral vascular disease) Secondary to tobacco abuse and diabetes. She has now lost 2 toes on her right foot secondary to gangrene. She has well perfused legs currently do to multiple interventions over the past several months by Dr. Wallace Cullens Junior.   Updated Medication  List Outpatient Encounter Prescriptions as of 03/16/2012  Medication Sig Dispense Refill  . ALPRAZolam (XANAX) 0.5 MG tablet Take 1 tablet (0.5 mg total) by mouth 3 (three) times daily as needed for sleep.  90 tablet  2  . atorvastatin (LIPITOR) 40 MG tablet TAKE ONE TABLET BY MOUTH DAILY  30 tablet  3  . carvedilol (COREG) 6.25 MG tablet Take 1 tablet (6.25 mg total) by mouth 2 (two) times daily with a meal.  180 tablet  3  . clopidogrel (PLAVIX) 75 MG tablet TAKE 1 TABLET BY MOUTH EVERY DAY  30 tablet  5  . digoxin (LANOXIN) 0.125 MG tablet Take 1 tablet (125 mcg total) by mouth daily.  90 tablet  3  . gabapentin (NEURONTIN) 600 MG tablet Take 600 mg by mouth 3 (three) times daily.        Marland Kitchen glucose blood (ACCU-CHEK AVIVA PLUS) test strip Check blood sugar 2-3 times a day.  100 each  12  . insulin aspart protamine-insulin aspart (NOVOLOG 70/30) (70-30) 100 UNIT/ML injection Inject 20 Units into the skin 2 (two) times daily with a meal.      . insulin detemir (LEVEMIR) 100 UNIT/ML injection Inject 30 Units into the skin at bedtime.  15 mL  6  . Insulin Pen Needle 31G X 8 MM MISC Test blood sugar two times a day.  100 each  5  . Lancets MISC Use as directed  200 each  5  . lisinopril (PRINIVIL,ZESTRIL) 5 MG tablet Take 1 tablet (5 mg total) by mouth daily.  30 tablet  2  . DISCONTD: ALPRAZolam (XANAX) 0.5 MG tablet TAKE 1 TABLET BY MOUTH EVERY DAY AS NEEDED FOR ANXIETY  30 tablet  2  . DISCONTD: carvedilol (COREG) 6.25 MG tablet Take 1 tablet (6.25 mg total) by mouth 2 (two) times daily with a meal.  60 tablet  3  . DISCONTD: insulin aspart (NOVOLOG) 100 UNIT/ML injection Inject 6 Units into the skin 3 (three) times daily before meals.       Marland Kitchen DISCONTD: insulin NPH-insulin regular (NOVOLIN 70/30 PENFILL) (70-30) 100 UNIT/ML injection Inject 10 Units into the skin 2 (two) times daily with a meal.  10 mL  12     Orders Placed This Encounter  Procedures  . [facility] MAMMOGRAPHY  . Tdap vaccine greater  than or equal to 7yo IM  . Flu vaccine greater than or equal to 3yo preservative free IM  . Lipid panel  . Microalbumin / creatinine urine ratio  . Comprehensive metabolic panel  . Hemoglobin A1c  . TSH  . Ambulatory referral to General Surgery    Return in about 3 months (around 06/15/2012).

## 2012-03-16 NOTE — Patient Instructions (Signed)
You received the TdaP and the flue vaccines today  Return on Thursday for fasting labs

## 2012-03-17 ENCOUNTER — Encounter: Payer: Self-pay | Admitting: Internal Medicine

## 2012-03-17 NOTE — Assessment & Plan Note (Signed)
With prior laser treatments and injections by Dr. Champ Mungo. She also has a complications of neuropathy and peripheral vascular disease. She is now following a low carbohydrate diet and using Levemir twice daily. Her sugars have been very well controlled and her hemoglobin A1c is due.

## 2012-03-17 NOTE — Assessment & Plan Note (Signed)
Secondary to tobacco abuse and diabetes. She has now lost 2 toes on her right foot secondary to gangrene. She has well perfused legs currently do to multiple interventions over the past several months by Dr. Wallace Cullens Junior.

## 2012-03-17 NOTE — Assessment & Plan Note (Signed)
She has been smoke free since September.

## 2012-03-18 ENCOUNTER — Other Ambulatory Visit (INDEPENDENT_AMBULATORY_CARE_PROVIDER_SITE_OTHER): Payer: Medicare Other | Admitting: *Deleted

## 2012-03-18 DIAGNOSIS — E119 Type 2 diabetes mellitus without complications: Secondary | ICD-10-CM

## 2012-03-18 DIAGNOSIS — E785 Hyperlipidemia, unspecified: Secondary | ICD-10-CM

## 2012-03-18 LAB — HEMOGLOBIN A1C: Hgb A1c MFr Bld: 7.3 % — ABNORMAL HIGH (ref 4.6–6.5)

## 2012-03-19 LAB — COMPREHENSIVE METABOLIC PANEL
Albumin: 3.6 g/dL (ref 3.5–5.2)
Alkaline Phosphatase: 76 U/L (ref 39–117)
BUN: 27 mg/dL — ABNORMAL HIGH (ref 6–23)
CO2: 26 mEq/L (ref 19–32)
GFR: 89.88 mL/min (ref >60.00)
Glucose, Bld: 201 mg/dL — ABNORMAL HIGH (ref 70–99)
Sodium: 139 mEq/L (ref 135–145)
Total Bilirubin: 0.4 mg/dL (ref 0.3–1.2)
Total Protein: 6.8 g/dL (ref 6.0–8.3)

## 2012-03-19 LAB — TSH: TSH: 0.99 u[IU]/mL (ref 0.35–5.50)

## 2012-03-19 LAB — LIPID PANEL
Cholesterol: 102 mg/dL (ref 0–200)
HDL: 33.9 mg/dL — ABNORMAL LOW (ref >39.00)
Triglycerides: 106 mg/dL (ref 0.0–149.0)
VLDL: 21.2 mg/dL (ref 0.0–40.0)

## 2012-03-19 NOTE — Progress Notes (Signed)
Quick Note:  Spoke with pt - informed of results and changes to med- informed to fast for next appt so lipids can be repeated. ______

## 2012-03-23 LAB — HM COLONOSCOPY

## 2012-04-05 ENCOUNTER — Telehealth: Payer: Self-pay | Admitting: Internal Medicine

## 2012-04-05 MED ORDER — LISINOPRIL 5 MG PO TABS: 5.0000 mg | ORAL_TABLET | Freq: Every day | ORAL | Status: DC

## 2012-04-05 NOTE — Telephone Encounter (Signed)
Filled until patient comes back in Dec.

## 2012-04-05 NOTE — Telephone Encounter (Signed)
Rx# [phone] Lisinopril 5mg  tablets Take 1 tablet by mouth every date Qty 30

## 2012-04-27 ENCOUNTER — Ambulatory Visit: Payer: Self-pay | Admitting: General Surgery

## 2012-04-28 HISTORY — PX: COLONOSCOPY W/ BIOPSIES: SHX1374

## 2012-04-29 ENCOUNTER — Ambulatory Visit: Payer: Self-pay | Admitting: General Surgery

## 2012-05-03 ENCOUNTER — Ambulatory Visit: Payer: Self-pay | Admitting: General Surgery

## 2012-05-04 LAB — PATHOLOGY REPORT

## 2012-05-05 ENCOUNTER — Other Ambulatory Visit: Payer: Self-pay | Admitting: Internal Medicine

## 2012-05-05 ENCOUNTER — Other Ambulatory Visit: Payer: Self-pay

## 2012-05-19 ENCOUNTER — Other Ambulatory Visit: Payer: Self-pay

## 2012-05-19 MED ORDER — ATORVASTATIN CALCIUM 40 MG PO TABS: 20.0000 mg | ORAL_TABLET | Freq: Every day | ORAL | Status: DC

## 2012-05-19 NOTE — Telephone Encounter (Signed)
lipitor 40 mg#30 0 R with new instructions sent to [facility] pharmacy

## 2012-05-21 ENCOUNTER — Other Ambulatory Visit: Payer: Self-pay

## 2012-05-21 ENCOUNTER — Other Ambulatory Visit: Payer: Self-pay | Admitting: Internal Medicine

## 2012-05-21 MED ORDER — INSULIN ASPART PROT & ASPART (70-30 MIX) 100 UNIT/ML ~~LOC~~ SUSP: 20.0000 [IU] | Freq: Two times a day (BID) | SUBCUTANEOUS | Status: DC

## 2012-05-21 NOTE — Telephone Encounter (Signed)
Refill request for Novalog 70-30 #10 ml 2 R sent electronic to [facility]

## 2012-06-07 ENCOUNTER — Encounter: Payer: Self-pay | Admitting: Internal Medicine

## 2012-06-10 ENCOUNTER — Other Ambulatory Visit: Payer: Self-pay | Admitting: Internal Medicine

## 2012-06-22 ENCOUNTER — Other Ambulatory Visit (HOSPITAL_COMMUNITY)
Admission: RE | Admit: 2012-06-22 | Discharge: 2012-06-22 | Disposition: A | Discharge status: Home / Self Care | Payer: Medicare Other | Source: Ambulatory Visit | Attending: Internal Medicine | Admitting: Internal Medicine

## 2012-06-22 ENCOUNTER — Encounter: Payer: Self-pay | Admitting: Internal Medicine

## 2012-06-22 ENCOUNTER — Ambulatory Visit (INDEPENDENT_AMBULATORY_CARE_PROVIDER_SITE_OTHER): Payer: Medicare Other | Admitting: Internal Medicine

## 2012-06-22 VITALS — BP 112/60 | HR 76 | Temp 98.6°F | Resp 12 | Ht 66.0 in | Wt 165.5 lb

## 2012-06-22 DIAGNOSIS — Z124 Encounter for screening for malignant neoplasm of cervix: Secondary | ICD-10-CM

## 2012-06-22 DIAGNOSIS — Z1331 Encounter for screening for depression: Secondary | ICD-10-CM

## 2012-06-22 DIAGNOSIS — E08311 Diabetes mellitus due to underlying condition with unspecified diabetic retinopathy with macular edema: Secondary | ICD-10-CM

## 2012-06-22 DIAGNOSIS — Z01419 Encounter for gynecological examination (general) (routine) without abnormal findings: Secondary | ICD-10-CM | POA: Insufficient documentation

## 2012-06-22 DIAGNOSIS — Z0000 Encounter for general adult medical examination without abnormal findings: Secondary | ICD-10-CM

## 2012-06-22 DIAGNOSIS — E559 Vitamin D deficiency, unspecified: Secondary | ICD-10-CM

## 2012-06-22 DIAGNOSIS — E11311 Type 2 diabetes mellitus with unspecified diabetic retinopathy with macular edema: Secondary | ICD-10-CM

## 2012-06-22 DIAGNOSIS — E11319 Type 2 diabetes mellitus with unspecified diabetic retinopathy without macular edema: Secondary | ICD-10-CM

## 2012-06-22 DIAGNOSIS — E119 Type 2 diabetes mellitus without complications: Secondary | ICD-10-CM

## 2012-06-22 DIAGNOSIS — E1339 Other specified diabetes mellitus with other diabetic ophthalmic complication: Secondary | ICD-10-CM

## 2012-06-22 DIAGNOSIS — Z1151 Encounter for screening for human papillomavirus (HPV): Secondary | ICD-10-CM | POA: Insufficient documentation

## 2012-06-22 DIAGNOSIS — Z1239 Encounter for other screening for malignant neoplasm of breast: Secondary | ICD-10-CM

## 2012-06-22 NOTE — Patient Instructions (Signed)
I have ordered your mammogram.  We will call you with the results of your PAP smear   Return for fasting labs later this week

## 2012-06-23 ENCOUNTER — Encounter: Payer: Self-pay | Admitting: Internal Medicine

## 2012-06-26 DIAGNOSIS — Z0000 Encounter for general adult medical examination without abnormal findings: Secondary | ICD-10-CM | POA: Insufficient documentation

## 2012-06-26 NOTE — Assessment & Plan Note (Signed)
Well controlled currently. She is up-to-date on diabetic eye exams. She does have a history of peripheral vascular disease and underwent amputation of 2 toes on her right foot due to gangrene.

## 2012-06-26 NOTE — Assessment & Plan Note (Signed)
Breast pelvic and Pap smear were done today. Pap smear was normal.

## 2012-06-26 NOTE — Progress Notes (Signed)
Patient ID: RAFFAELLA EDISON, female   DOB: October 08, 1960, 51 y.o.   MRN: [id number]  Subjective:     Laura Reed is a 51 y.o. female and is here for a comprehensive physical exam. The patient reports no problems.  History   Social History  . Marital Status: Legally Separated    Spouse Name: N/A    Number of Children: N/A  . Years of Education: N/A   Occupational History  . Not on file.   Social History Main Topics  . Smoking status: Former Smoker    Types: Cigarettes    Quit date: 03/30/2011  . Smokeless tobacco: Never Used  . Alcohol Use: No  . Drug Use: No  . Sexually Active: Not on file   Other Topics Concern  . Not on file   Social History Narrative  . No narrative on file   Health Maintenance  Topic Date Due  . Influenza Vaccine  03/07/2013  . Mammogram  07/16/2013  . Pap Smear  06/23/2015  . Tetanus/tdap  03/16/2022  . Colonoscopy  04/27/2022    The following portions of the patient's history were reviewed and updated as appropriate: allergies, current medications, past family history, past medical history, past social history, past surgical history and problem list.  Review of Systems A comprehensive review of systems was negative.   Objective:        Assessment:    Healthy female exam. BP 112/60  Pulse 76  Temp 98.6 F (37 C) (Oral)  Resp 12  Ht 5\' 6"  (1.676 m)  Wt 165 lb 8 oz (75.07 kg)  BMI 26.71 kg/m2  SpO2 98%  General Appearance:    Alert, cooperative, no distress, appears stated age  Head:    Normocephalic, without obvious abnormality, atraumatic  Eyes:    PERRL, conjunctiva/corneas clear, EOM's intact, fundi    benign, both eyes  Ears:    Normal TM's and external ear canals, both ears  Nose:   Nares normal, septum midline, mucosa normal, no drainage    or sinus tenderness  Throat:   Lips, mucosa, and tongue normal; teeth and gums normal  Neck:   Supple, symmetrical, trachea midline, no adenopathy;    thyroid:  no  enlargement/tenderness/nodules; no carotid   bruit or JVD  Back:     Symmetric, no curvature, ROM normal, no CVA tenderness  Lungs:     Clear to auscultation bilaterally, respirations unlabored  Chest Wall:    No tenderness or deformity   Heart:    Regular rate and rhythm, S1 and S2 normal, no murmur, rub   or gallop  Breast Exam:    No tenderness, masses, or nipple abnormality  Abdomen:     Soft, non-tender, bowel sounds active all four quadrants,    no masses, no organomegaly  Genitalia:    Normal female without lesion, discharge or tenderness  Rectal:    Normal tone, normal prostate, no masses or tenderness;   guaiac negative stool  Extremities:   Extremities normal, atraumatic, no cyanosis or edema  Pulses:   2+ and symmetric all extremities  Skin:   Skin color, texture, turgor normal, no rashes or lesions  Lymph nodes:   Cervical, supraclavicular, and axillary nodes normal  Neurologic:   CNII-XII intact, normal strength, sensation and reflexes    throughout       Plan:    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 She is up-to-date on  diabetic eye exams. She does have a history of peripheral vascular disease and underwent amputation of 2 toes on her right foot due to gangrene.  Routine general medical examination at a health care facility Breast pelvic and Pap smear were done today. Pap smear was normal.   Updated Medication List Outpatient Encounter Prescriptions as of 06/22/2012  Medication Sig Dispense Refill  . ALPRAZolam (XANAX) 0.5 MG tablet Take 1 tablet (0.5 mg total) by mouth 3 (three) times daily as needed for sleep.  90 tablet  2  . atorvastatin (LIPITOR) 40 MG tablet Take 0.5 tablets (20 mg total) by mouth daily.  30 tablet  0  . carvedilol (COREG) 6.25 MG tablet Take 1 tablet (6.25 mg total) by mouth 2 (two) times daily with a meal.  180 tablet  3  . clopidogrel (PLAVIX) 75 MG tablet TAKE  1 TABLET BY MOUTH EVERY DAY  30 tablet  5  . digoxin (LANOXIN) 0.125 MG tablet Take 1 tablet (125 mcg total) by mouth daily.  90 tablet  3  . gabapentin (NEURONTIN) 600 MG tablet Take 600 mg by mouth 3 (three) times daily.        Marland Kitchen glucose blood (ACCU-CHEK AVIVA PLUS) test strip Check blood sugar 2-3 times a day.  100 each  12  . insulin aspart protamine-insulin aspart (NOVOLOG 70/30) (70-30) 100 UNIT/ML injection Inject 20 Units into the skin 2 (two) times daily with a meal.  10 mL  2  . insulin detemir (LEVEMIR) 100 UNIT/ML injection Inject 30 Units into the skin at bedtime.  15 mL  6  . Insulin Pen Needle 31G X 8 MM MISC Test blood sugar two times a day.  100 each  5  . Lancets MISC Use as directed  200 each  5  . lisinopril (PRINIVIL,ZESTRIL) 5 MG tablet Take 1 tablet (5 mg total) by mouth daily.  30 tablet  3

## 2012-07-01 ENCOUNTER — Other Ambulatory Visit: Payer: 59

## 2012-07-14 ENCOUNTER — Other Ambulatory Visit: Payer: Self-pay | Admitting: Internal Medicine

## 2012-07-14 MED ORDER — ATORVASTATIN CALCIUM 40 MG PO TABS: 20.0000 mg | ORAL_TABLET | Freq: Every day | ORAL | Status: DC

## 2012-07-14 MED ORDER — LISINOPRIL 5 MG PO TABS: 5.0000 mg | ORAL_TABLET | Freq: Every day | ORAL | Status: DC

## 2012-07-14 NOTE — Telephone Encounter (Signed)
Med filled with no refills.

## 2012-07-14 NOTE — Telephone Encounter (Signed)
lisinopril (PRINIVIL,ZESTRIL) 5 MG tablet  #30  atorvastatin (LIPITOR) 40 MG tablet   #30

## 2012-07-24 ENCOUNTER — Other Ambulatory Visit: Payer: Self-pay | Admitting: Internal Medicine

## 2012-07-26 ENCOUNTER — Telehealth: Payer: Self-pay | Admitting: Internal Medicine

## 2012-07-26 DIAGNOSIS — Z0279 Encounter for issue of other medical certificate: Secondary | ICD-10-CM

## 2012-07-26 NOTE — Telephone Encounter (Signed)
Dropped off Brochure to be filled out for Diabetic Shoes. I put them in Dr. Melina Schools inbox upfront

## 2012-07-26 NOTE — Telephone Encounter (Signed)
Placed in your red folder.

## 2012-07-27 ENCOUNTER — Ambulatory Visit: Payer: Self-pay | Admitting: Internal Medicine

## 2012-08-06 ENCOUNTER — Encounter: Payer: Self-pay | Admitting: Internal Medicine

## 2012-09-01 ENCOUNTER — Other Ambulatory Visit: Payer: Self-pay | Admitting: *Deleted

## 2012-09-02 MED ORDER — ATORVASTATIN CALCIUM 40 MG PO TABS: 20.0000 mg | ORAL_TABLET | Freq: Every day | ORAL | Status: DC

## 2012-09-05 ENCOUNTER — Other Ambulatory Visit: Payer: Self-pay | Admitting: Internal Medicine

## 2012-09-07 ENCOUNTER — Other Ambulatory Visit: Payer: Self-pay | Admitting: Internal Medicine

## 2012-09-16 ENCOUNTER — Ambulatory Visit: Payer: Self-pay | Admitting: Vascular Surgery

## 2012-09-16 LAB — BASIC METABOLIC PANEL
Anion Gap: 4 — ABNORMAL LOW (ref 7–16)
Calcium, Total: 8.4 mg/dL — ABNORMAL LOW (ref 8.5–10.1)
Chloride: 106 mmol/L (ref 98–107)
Co2: 29 mmol/L (ref 21–32)
EGFR (Non-African Amer.): >60
Glucose: 145 mg/dL — ABNORMAL HIGH (ref 65–99)
Osmolality: 286 (ref 275–301)

## 2012-09-20 ENCOUNTER — Ambulatory Visit: Payer: 59 | Admitting: Internal Medicine

## 2012-10-09 ENCOUNTER — Other Ambulatory Visit: Payer: Self-pay | Admitting: Internal Medicine

## 2012-10-13 ENCOUNTER — Other Ambulatory Visit: Payer: Self-pay | Admitting: *Deleted

## 2012-10-13 MED ORDER — ATORVASTATIN CALCIUM 40 MG PO TABS: 20.0000 mg | ORAL_TABLET | Freq: Every day | ORAL | Status: DC

## 2012-10-13 NOTE — Telephone Encounter (Signed)
Med filled.  

## 2012-10-22 ENCOUNTER — Other Ambulatory Visit: Payer: Self-pay | Admitting: Internal Medicine

## 2012-11-04 ENCOUNTER — Other Ambulatory Visit: Payer: Self-pay | Admitting: Internal Medicine

## 2012-11-05 NOTE — Telephone Encounter (Signed)
Rx sent to pharmacy by escript  

## 2012-11-18 ENCOUNTER — Other Ambulatory Visit: Payer: Self-pay | Admitting: Internal Medicine

## 2012-11-18 NOTE — Telephone Encounter (Signed)
Refill denied, last refilled 10/13/12 with 5 refills, verified with [facility].

## 2012-12-15 ENCOUNTER — Other Ambulatory Visit: Payer: Self-pay | Admitting: *Deleted

## 2012-12-15 MED ORDER — CLOPIDOGREL BISULFATE 75 MG PO TABS: ORAL_TABLET | ORAL | Status: DC

## 2013-01-05 ENCOUNTER — Other Ambulatory Visit: Payer: Self-pay | Admitting: Internal Medicine

## 2013-01-05 NOTE — Telephone Encounter (Signed)
Pt notified of need for office visit, followup scheduled 01/14/13.

## 2013-01-14 ENCOUNTER — Ambulatory Visit (INDEPENDENT_AMBULATORY_CARE_PROVIDER_SITE_OTHER): Payer: Medicare Other | Admitting: Internal Medicine

## 2013-01-14 ENCOUNTER — Encounter: Payer: Self-pay | Admitting: Internal Medicine

## 2013-01-14 VITALS — BP 112/64 | HR 82 | Temp 98.4°F | Resp 14 | Wt 170.8 lb

## 2013-01-14 DIAGNOSIS — E559 Vitamin D deficiency, unspecified: Secondary | ICD-10-CM

## 2013-01-14 DIAGNOSIS — M25559 Pain in unspecified hip: Secondary | ICD-10-CM

## 2013-01-14 DIAGNOSIS — G8929 Other chronic pain: Secondary | ICD-10-CM

## 2013-01-14 DIAGNOSIS — M25552 Pain in left hip: Secondary | ICD-10-CM

## 2013-01-14 DIAGNOSIS — E119 Type 2 diabetes mellitus without complications: Secondary | ICD-10-CM

## 2013-01-14 DIAGNOSIS — I10 Essential (primary) hypertension: Secondary | ICD-10-CM

## 2013-01-14 MED ORDER — CLOPIDOGREL BISULFATE 75 MG PO TABS: ORAL_TABLET | ORAL | Status: DC

## 2013-01-14 MED ORDER — LANCETS MISC: Status: DC

## 2013-01-14 MED ORDER — INSULIN PEN NEEDLE 31G X 8 MM MISC: Status: DC

## 2013-01-14 NOTE — Progress Notes (Signed)
Patient ID: Laura Reed, female   DOB: 1960-09-20, 52 y.o.   MRN: [id number]     Patient Active Problem List   Diagnosis Date Noted  . Chronic hip pain 01/16/2013  . Routine general medical examination at a health care facility 06/26/2012  . PVD (peripheral vascular disease)   . Tobacco abuse 04/29/2011  . 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 Hypertension   . Hemorrhoid     Subjective:  CC:   Chief Complaint  Patient presents with  . Follow-up    DM    HPI:   Laura Reed a 52 y.o. female who presents Follow up on DM. Has been checking her sugars several times daily and has brought her log which  Is reviewed with her today. . Fasting 130 to 170 .  Pre lunch 140s usually . Pre dinner   97 to 150    Bedtime  113 to 160 .  Her current insulin regimen  Morning 20 units 70/30  Pre dinner 20 units of 70/30    And 30units  of levemir at bedtime .  [facility]    PAD:  She had recent evaluation for nonhealing wound after podiatry procedure  And was found to have a repeat occlusion despite quitting smoking in January 2013.   Dr Wyn Quaker opened up 2 small arteries on the the right leg.  She continues to have claudication symptoms with walking, and is scheduled to see Dr. Gilda Crease next month .  She reports pain in both hips that is brought on by walking independently, but not with walking 1/2 mile on treadmill or when leaning on a grocery cart.    She her last visit she has also undergone laparoscopic  cholecystectomy by Dr. Lemar Livings, and has developed a nonreducible, non tender Ventral hernia.    Past Medical History  Diagnosis Date  . Diabetes mellitus   . Hypertension   . Hemorrhoid   . PVD (peripheral vascular disease)     Past Surgical History  Procedure Laterality Date  . Coronary artery bypass graft  2009    3 vessel  . Arterial bypass surgry  2009, 2013 x 2   right leg , done in [place]  . Cesarean section    . Cholecystectomy  oct 2013    gallstones,  [facility]       The following portions of the patient's history were reviewed and updated as appropriate: Allergies, current medications, and problem list.    Review of Systems:   12 Pt  review of systems was negative except those addressed in the HPI,     History   Social History  . Marital Status: Legally Separated    Spouse Name: N/A    Number of Children: N/A  . Years of Education: N/A   Occupational History  . Not on file.   Social History Main Topics  . Smoking status: Former Smoker    Types: Cigarettes    Quit date: 03/30/2011  . Smokeless tobacco: Never Used  . Alcohol Use: No  . Drug Use: No  . Sexually Active: Not on file   Other Topics Concern  . Not on file   Social History Narrative  . No narrative on file    Objective:  BP 112/64  Pulse 82  Temp(Src) 98.4 F (36.9 C) (Oral)  Resp 14  Wt 170 lb  12 oz (77.452 kg)  BMI 27.57 kg/m2  SpO2 98%  General appearance: alert, cooperative and appears stated age Ears: normal TM's and external ear canals both ears Throat: lips, mucosa, and tongue normal; teeth and gums normal Neck: no adenopathy, no carotid bruit, supple, symmetrical, trachea midline and thyroid not enlarged, symmetric, no tenderness/mass/nodules Back: symmetric, no curvature. ROM normal. No CVA tenderness. Lungs: clear to auscultation bilaterally Heart: regular rate and rhythm, S1, S2 normal, no murmur, click, rub or gallop Abdomen: soft, non-tender; bowel sounds normal; no masses,  no organomegaly Pulses: 2+ and symmetric Skin: Skin color, texture, turgor normal. No rashes or lesions Lymph nodes: Cervical, supraclavicular, and axillary nodes normal.  Assessment and Plan:  Diabetes mellitus due to underlying condition with diabetic retinopathy with macular edema hgba1c is elevated to 8.0  On current regime of twice daily 70/30 and  daily levemir.   Will increase 70/30 dose to 22 units bid. contineu 30 units levemir.  Reminder for annual diabetic eye exam given..  Foot exam done. Meds reviewed and she is on a baby aspirin daily., a statin and an ACE inhibitor.   Chronic hip pain Given the nature of the pain withfull wt bearing suspect OA vs DJD. She has known PAD with prior interventions and follows up with [name] next week.  Plain films of hips and pelvis ordered.   Hypertension Well controlled on current regimen. Renal function stable, no changes today.   Updated Medication List Outpatient Encounter Prescriptions as of 01/14/2013  Medication Sig Dispense Refill  . ACCU-CHEK AVIVA PLUS test strip CHECK BLOOD SUGAR 2 TO 3 TIMES DAILY  100 each  3  . ALPRAZolam (XANAX) 0.5 MG tablet Take 1 tablet (0.5 mg total) by mouth 3 (three) times daily as needed for sleep.  90 tablet  2  . aspirin 81 MG tablet Take 81 mg by mouth daily.      Marland Kitchen atorvastatin (LIPITOR) 40 MG tablet Take 0.5 tablets (20 mg total) by mouth daily.  30 tablet  5  . carvedilol (COREG) 6.25 MG tablet Take 1 tablet (6.25 mg total) by mouth 2 (two) times daily with a meal.  180 tablet  3  . Cholecalciferol (VITAMIN D3) 2000 UNITS TABS Take 1 capsule by mouth daily.      . clopidogrel (PLAVIX) 75 MG tablet TAKE 1 TABLET BY MOUTH EVERY DAY  90 tablet  3  . DIGOX 125 MCG tablet TAKE 1 TABLET BY MOUTH EVERY DAY  90 tablet  1  . gabapentin (NEURONTIN) 600 MG tablet Take 600 mg by mouth 3 (three) times daily.        . Insulin Pen Needle (B-D ULTRAFINE III SHORT PEN) 31G X 8 MM MISC USE WITH PEN TO ADMINISTER INSULIN TWICE DAILY  100 each  0  . Lancets MISC Use as directed to check sugars 4 times daily   Uncontrolled diabetes  200 each  5  . LEVEMIR FLEXPEN 100 UNIT/ML injection INJECT 30 UNITS UNDER THE SKIN EVERY NIGHT AT BEDTIME  15 mL  5  . lisinopril (PRINIVIL,ZESTRIL) 5 MG tablet TAKE 1 TABLET BY MOUTH EVERY DAY  30 tablet  5  . NOVOLOG MIX 70/30 FLEXPEN  (70-30) 100 UNIT/ML injection INJECT 20 UNITS INTO THE SKIN TWICE DAILY WITH A MEAL  15 mL  11  . [DISCONTINUED] B-D ULTRAFINE III SHORT PEN 31G X 8 MM MISC USE AS DIRECTED  100 each  0  . [DISCONTINUED] clopidogrel (PLAVIX) 75 MG tablet TAKE  1 TABLET BY MOUTH EVERY DAY  30 tablet  0  . [DISCONTINUED] Lancets MISC Use as directed  200 each  5  . [DISCONTINUED] clopidogrel (PLAVIX) 75 MG tablet TAKE 1 TABLET BY MOUTH EVERY DAY  30 tablet  0   No facility-administered encounter medications on file as of 01/14/2013.     Orders Placed This Encounter  Procedures  . DG Hip Bilateral W/Pelvis  . Hemoglobin A1c  . Comp Met (CMET)  . HM DIABETES FOOT EXAM    Return in about 3 months (around 04/16/2013).

## 2013-01-14 NOTE — Patient Instructions (Addendum)
Continue your current doses of insulin until you hear from me about your labs  Your stomach bulge is a hernia,  From your surgery.  [facility]Dr [facility] can fix it for you if it starts to bother you  We are getting x rays of both hips

## 2013-01-15 LAB — HEMOGLOBIN A1C
Hgb A1c MFr Bld: 8 % — ABNORMAL HIGH (ref <5.7)
Mean Plasma Glucose: 183 mg/dL — ABNORMAL HIGH (ref <117)

## 2013-01-15 LAB — COMPREHENSIVE METABOLIC PANEL
AST: 12 U/L (ref 0–37)
Alkaline Phosphatase: 74 U/L (ref 39–117)
Glucose, Bld: 132 mg/dL — ABNORMAL HIGH (ref 70–99)
Sodium: 137 mEq/L (ref 135–145)
Total Bilirubin: 0.4 mg/dL (ref 0.3–1.2)
Total Protein: 6.8 g/dL (ref 6.0–8.3)

## 2013-01-16 ENCOUNTER — Encounter: Payer: Self-pay | Admitting: Internal Medicine

## 2013-01-16 DIAGNOSIS — G8929 Other chronic pain: Secondary | ICD-10-CM | POA: Insufficient documentation

## 2013-01-16 NOTE — Assessment & Plan Note (Signed)
hgba1c is elevated to 8.0  On current regime of twice daily 70/30 and daily levemir.   Will increase 70/30 dose to 22 units bid. contineu 30 units levemir.  Reminder for annual diabetic eye exam given..  Foot exam done. Meds reviewed and she is on a baby aspirin daily., a statin and an ACE inhibitor.

## 2013-01-16 NOTE — Assessment & Plan Note (Signed)
Well controlled on current regimen. Renal function stable, no changes today. 

## 2013-01-16 NOTE — Assessment & Plan Note (Signed)
Given the nature of the pain withfull wt bearing suspect OA vs DJD. She has known PAD with prior interventions and follows up with [name] next week.  Plain films of hips and pelvis ordered.

## 2013-03-03 ENCOUNTER — Other Ambulatory Visit: Payer: Self-pay | Admitting: Internal Medicine

## 2013-03-04 ENCOUNTER — Other Ambulatory Visit: Payer: Self-pay | Admitting: Internal Medicine

## 2013-03-04 NOTE — Telephone Encounter (Signed)
Okay to refill? 

## 2013-03-09 NOTE — Telephone Encounter (Signed)
Rx faxed to pharmacy  

## 2013-03-21 ENCOUNTER — Other Ambulatory Visit: Payer: Self-pay | Admitting: Podiatry

## 2013-03-23 ENCOUNTER — Ambulatory Visit: Payer: Self-pay | Admitting: Vascular Surgery

## 2013-03-23 LAB — BASIC METABOLIC PANEL
Anion Gap: 6 — ABNORMAL LOW (ref 7–16)
Chloride: 105 mmol/L (ref 98–107)
Co2: 26 mmol/L (ref 21–32)
EGFR (African American): >60
Glucose: 148 mg/dL — ABNORMAL HIGH (ref 65–99)
Osmolality: 281 (ref 275–301)
Potassium: 5.2 mmol/L — ABNORMAL HIGH (ref 3.5–5.1)

## 2013-03-31 ENCOUNTER — Telehealth: Payer: Self-pay | Admitting: *Deleted

## 2013-03-31 NOTE — Telephone Encounter (Signed)
Patient needs follow up appointment scheduled for authorization for surgery per [facility].  Left message for patient to call office.

## 2013-04-03 ENCOUNTER — Other Ambulatory Visit: Payer: Self-pay | Admitting: Internal Medicine

## 2013-04-05 ENCOUNTER — Ambulatory Visit: Payer: Self-pay | Admitting: Podiatry

## 2013-04-05 LAB — BASIC METABOLIC PANEL
Anion Gap: 5 — ABNORMAL LOW (ref 7–16)
BUN: 15 mg/dL (ref 7–18)
Calcium, Total: 9.1 mg/dL (ref 8.5–10.1)
Chloride: 103 mmol/L (ref 98–107)
Creatinine: 0.67 mg/dL (ref 0.60–1.30)
EGFR (Non-African Amer.): >60
Osmolality: 278 (ref 275–301)
Potassium: 4.5 mmol/L (ref 3.5–5.1)

## 2013-04-05 LAB — CBC WITH DIFFERENTIAL/PLATELET
Basophil #: 0.1 10*3/uL (ref 0.0–0.1)
Basophil %: 0.8 %
Eosinophil #: 0.3 10*3/uL (ref 0.0–0.7)
Eosinophil %: 2.6 %
HCT: 36.6 % (ref 35.0–47.0)
HGB: 12.5 g/dL (ref 12.0–16.0)
Lymphocyte %: 16.9 %
MCHC: 34.2 g/dL (ref 32.0–36.0)
Monocyte %: 6 %
Neutrophil #: 9.7 10*3/uL — ABNORMAL HIGH (ref 1.4–6.5)
Neutrophil %: 73.7 %
RBC: 4.41 10*6/uL (ref 3.80–5.20)
RDW: 13.5 % (ref 11.5–14.5)

## 2013-04-06 ENCOUNTER — Encounter: Payer: Self-pay | Admitting: Internal Medicine

## 2013-04-06 ENCOUNTER — Ambulatory Visit (INDEPENDENT_AMBULATORY_CARE_PROVIDER_SITE_OTHER): Payer: Medicare Other | Admitting: Internal Medicine

## 2013-04-06 VITALS — BP 120/60 | HR 78 | Temp 98.3°F | Resp 14 | Ht 66.0 in | Wt 166.8 lb

## 2013-04-06 DIAGNOSIS — Z01818 Encounter for other preprocedural examination: Secondary | ICD-10-CM | POA: Insufficient documentation

## 2013-04-06 DIAGNOSIS — E1142 Type 2 diabetes mellitus with diabetic polyneuropathy: Secondary | ICD-10-CM | POA: Insufficient documentation

## 2013-04-06 DIAGNOSIS — E1169 Type 2 diabetes mellitus with other specified complication: Secondary | ICD-10-CM | POA: Insufficient documentation

## 2013-04-06 DIAGNOSIS — Z23 Encounter for immunization: Secondary | ICD-10-CM

## 2013-04-06 DIAGNOSIS — E08311 Diabetes mellitus due to underlying condition with unspecified diabetic retinopathy with macular edema: Secondary | ICD-10-CM

## 2013-04-06 MED ORDER — INSULIN ASPART PROT & ASPART (70-30 MIX) 100 UNIT/ML PEN: PEN_INJECTOR | SUBCUTANEOUS | Status: DC

## 2013-04-06 NOTE — Addendum Note (Signed)
Addended by: Dennie Bible on: 04/06/2013 04:16 PM   Modules accepted: Orders

## 2013-04-06 NOTE — Assessment & Plan Note (Signed)
Cardiac eval has been done by [name].  She is medically cleared for surgery .  Changes to insulin dosing have been done today

## 2013-04-06 NOTE — Progress Notes (Signed)
Patient ID: Laura Reed, female   DOB: 04-20-1961, 52 y.o.   MRN: [id number]  Patient Active Problem List   Diagnosis Date Noted  . Preoperative evaluation of a medical condition to rule out surgical contraindications (TAR required) 04/06/2013  . Diabetic peripheral neuropathy associated with type 2 diabetes mellitus 04/06/2013  . Diabetic osteomyelitis 04/06/2013  . Chronic hip pain 01/16/2013  . Routine general medical examination at a health care facility 06/26/2012  . PVD (peripheral vascular disease)   . Tobacco abuse 04/29/2011  . 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 Hypertension   . Hemorrhoid     Subjective:  CC:   Chief Complaint  Patient presents with  . Follow-up    surgical clearance    HPI:   Laura Reed a 52 y.o. female who presentsPre op medical clearance requested for anticipated  amputatuion of left great toe secondary to osteomyelitis complicated by  PAD and diabetic neuropthy. Symptoms started 4 weeks ago with pain accompaneid by pururlent serosanguinous drainage which apparently started from a  Hangnail. Per patient was treated with antibiotics by podiatry and x rays indicated oseto.  Dr Alberteen Spindle has scheduled the surgery for Oct 3.  It will be done under Conscious sedation and local anesthesia.     Dr Lady Gary has already seen her for cardiac clearance. Also scheduled to have a fem fem bypass by [facility] in a few weeks.    Blood sugars have been in the 150 or higher in the morning fasting>  Her post prandials have been < 130 most of the time.  Taking 70/30 insulin 24 units bid,  And levemir 30 units.  Biggest meal is dinner. taking lipitor, plavix and lisinopril .  Behind on eye exam     Past Medical History  Diagnosis Date  . Diabetes mellitus   . Hypertension   . Hemorrhoid   . PVD (peripheral vascular disease)     Past Surgical History  Procedure Laterality Date  . Coronary artery bypass graft   2009    3 vessel  . Arterial bypass surgry  2009, 2013 x 2    right leg , done in [place]  . Cesarean section    . Cholecystectomy  oct 2013    gallstones,  [facility]       The following portions of the patient's history were reviewed and updated as appropriate: Allergies, current medications, and problem list.    Review of Systems:   Patient denies headache, fevers, malaise, unintentional weight loss, skin rash, eye pain, sinus congestion and sinus pain, sore throat, dysphagia,  hemoptysis , cough, dyspnea, wheezing, chest pain, palpitations, orthopnea, edema, abdominal pain, nausea, melena, diarrhea, constipation, flank pain, dysuria, hematuria, urinary  Frequency, nocturia, numbness, tingling, seizures,  Focal weakness, Loss of consciousness,  Tremor, insomnia, depression, anxiety, and suicidal ideation.      History   Social History  . Marital Status: Legally Separated    Spouse Name: N/A    Number of Children: N/A  . Years of Education: N/A   Occupational History  . Not on file.   Social History Main Topics  . Smoking status: Former Smoker    Types: Cigarettes    Quit date: 03/30/2011  . Smokeless tobacco: Never Used  . Alcohol Use: No  . Drug Use: No  . Sexual Activity: Not on file   Other Topics Concern  . Not on file   Social History Narrative  .  No narrative on file    Objective:  Filed Vitals:   04/06/13 0928  BP: 120/60  Pulse: 78  Temp: 98.3 F (36.8 C)  Resp: 14     General appearance: alert, cooperative and appears stated age Ears: normal TM's and external ear canals both ears Throat: lips, mucosa, and tongue normal; teeth and gums normal Neck: no adenopathy, no carotid bruit, supple, symmetrical, trachea midline and thyroid not enlarged, symmetric, no tenderness/mass/nodules Back: symmetric, no curvature. ROM normal. No CVA tenderness. Lungs: clear to auscultation bilaterally Heart: regular rate and rhythm, S1, S2 normal, no murmur,  click, rub or gallop Abdomen: soft, non-tender; bowel sounds normal; no masses,  no organomegaly Pulses: 2+ and symmetric Skin: Skin color, texture, turgor normal. No rashes or lesions Lymph nodes: Cervical, supraclavicular, and axillary nodes normal. Decreased sensation to light touch bilaterally.  Pulse feeble bilaterally   Assessment and Plan:  Diabetes mellitus due to underlying condition with diabetic retinopathy with macular edema A!C was 8.0 in mid July and she is not due for repeat yet, but her fasting sugars have been elevated slightly.  I have increased her evening dose of 70/30 insulin to 27 units .  Continue 24 units in the morning and 30 units of  Basal insulin (Levemir)  daily.  Return in mid October for a1c ,and has eye appt with [facility] in mid October as well  Preoperative evaluation of a medical condition to rule out surgical contraindications (TAR required) Cardiac eval has been done by [facility].  She is medically cleared for surgery .  Changes to insulin dosing have been done today  Diabetic peripheral neuropathy associated with type 2 diabetes mellitus Complicated by severe PAD .  Now with osteomyleitis of great left toe, requiring amputation , scheduled for oct 3  Diabetic osteomyelitis Failing to resolve due to severe PAD  amputation of great toe planned    Updated Medication List Outpatient Encounter Prescriptions as of 04/06/2013  Medication Sig Dispense Refill  . ACCU-CHEK AVIVA PLUS test strip CHECK BLOOD SUGAR 2 TO 3 TIMES DAILY  100 each  3  . ALPRAZolam (XANAX) 0.5 MG tablet TAKE 1 TABLET BY MOUTH THREE TIMES DAILY AS NEEDED  90 tablet  0  . aspirin 81 MG tablet Take 81 mg by mouth daily.      Marland Kitchen atorvastatin (LIPITOR) 40 MG tablet Take 0.5 tablets (20 mg total) by mouth daily.  30 tablet  5  . carvedilol (COREG) 6.25 MG tablet TAKE 1 TABLET BY MOUTH TWICE DAILY WITH MEALS  180 tablet  1  . Cholecalciferol (VITAMIN D3) 2000 UNITS TABS Take 1 capsule by mouth  daily.      . clopidogrel (PLAVIX) 75 MG tablet TAKE 1 TABLET BY MOUTH EVERY DAY  90 tablet  3  . DIGOX 125 MCG tablet TAKE 1 TABLET BY MOUTH EVERY DAY  90 tablet  1  . doxycycline (ADOXA) 100 MG tablet Take 100 mg by mouth 2 (two) times daily.      Marland Kitchen gabapentin (NEURONTIN) 600 MG tablet Take 600 mg by mouth 3 (three) times daily.        . Insulin Aspart Prot & Aspart (NOVOLOG MIX 70/30 FLEXPEN) (70-30) 100 UNIT/ML SUPN 24 units in the am and 27 units in the pm before meal  15 mL  11  . Insulin Pen Needle (B-D ULTRAFINE III SHORT PEN) 31G X 8 MM MISC USE WITH PEN TO ADMINISTER INSULIN TWICE DAILY  100 each  0  .  Lancets MISC Use as directed to check sugars 4 times daily   Uncontrolled diabetes  200 each  5  . LEVEMIR FLEXPEN 100 UNIT/ML injection INJECT 30 UNITS UNDER THE SKIN EVERY NIGHT AT BEDTIME  15 mL  5  . lisinopril (PRINIVIL,ZESTRIL) 5 MG tablet TAKE 1 TABLET BY MOUTH EVERY DAY  30 tablet  5  . [DISCONTINUED] NOVOLOG MIX 70/30 FLEXPEN (70-30) 100 UNIT/ML injection INJECT 20 UNITS INTO THE SKIN TWICE DAILY WITH A MEAL  15 mL  11   No facility-administered encounter medications on file as of 04/06/2013.

## 2013-04-06 NOTE — Patient Instructions (Addendum)
Increase  Your 70/30 evening dose to 27 units ,  Keep the morning dose 24 units  Keep the levemir dose 30 units

## 2013-04-06 NOTE — Assessment & Plan Note (Signed)
Failing to resolve due to severe PAD  amputation of great toe planned

## 2013-04-06 NOTE — Assessment & Plan Note (Signed)
Laura Reed was 8.0 in mid July and she is not due for repeat yet, but her fasting sugars have been elevated slightly.  I have increased her evening dose of 70/30 insulin to 27 units .  Continue 24 units in the morning and 30 units of  Basal insulin (Levemir)  daily.  Return in mid October for a1c ,and has eye appt with [facility] in mid October as well

## 2013-04-06 NOTE — Assessment & Plan Note (Signed)
Complicated by severe PAD .  Now with osteomyleitis of great left toe, requiring amputation , scheduled for oct 3

## 2013-04-08 ENCOUNTER — Ambulatory Visit: Payer: Self-pay | Admitting: Podiatry

## 2013-04-12 LAB — WOUND CULTURE

## 2013-04-13 LAB — PATHOLOGY REPORT

## 2013-04-15 ENCOUNTER — Ambulatory Visit: Payer: Self-pay | Admitting: Vascular Surgery

## 2013-04-20 ENCOUNTER — Ambulatory Visit: Payer: Medicare Other | Admitting: Internal Medicine

## 2013-04-29 ENCOUNTER — Ambulatory Visit: Payer: Self-pay | Admitting: Cardiology

## 2013-05-03 ENCOUNTER — Other Ambulatory Visit: Payer: Medicare Other

## 2013-05-09 ENCOUNTER — Other Ambulatory Visit: Payer: Self-pay | Admitting: Internal Medicine

## 2013-05-18 ENCOUNTER — Encounter: Payer: Self-pay | Admitting: Internal Medicine

## 2013-05-18 ENCOUNTER — Ambulatory Visit (INDEPENDENT_AMBULATORY_CARE_PROVIDER_SITE_OTHER): Payer: Medicare Other | Admitting: Internal Medicine

## 2013-05-18 VITALS — BP 136/72 | HR 98 | Temp 98.8°F | Resp 12 | Ht 66.0 in | Wt 169.5 lb

## 2013-05-18 DIAGNOSIS — E119 Type 2 diabetes mellitus without complications: Secondary | ICD-10-CM

## 2013-05-18 DIAGNOSIS — I798 Other disorders of arteries, arterioles and capillaries in diseases classified elsewhere: Secondary | ICD-10-CM

## 2013-05-18 DIAGNOSIS — IMO0002 Reserved for concepts with insufficient information to code with codable children: Secondary | ICD-10-CM

## 2013-05-18 DIAGNOSIS — E11319 Type 2 diabetes mellitus with unspecified diabetic retinopathy without macular edema: Secondary | ICD-10-CM

## 2013-05-18 DIAGNOSIS — Z8619 Personal history of other infectious and parasitic diseases: Secondary | ICD-10-CM

## 2013-05-18 DIAGNOSIS — F17200 Nicotine dependence, unspecified, uncomplicated: Secondary | ICD-10-CM

## 2013-05-18 DIAGNOSIS — E08311 Diabetes mellitus due to underlying condition with unspecified diabetic retinopathy with macular edema: Secondary | ICD-10-CM

## 2013-05-18 DIAGNOSIS — I739 Peripheral vascular disease, unspecified: Secondary | ICD-10-CM

## 2013-05-18 DIAGNOSIS — I10 Essential (primary) hypertension: Secondary | ICD-10-CM

## 2013-05-18 DIAGNOSIS — E11311 Type 2 diabetes mellitus with unspecified diabetic retinopathy with macular edema: Secondary | ICD-10-CM

## 2013-05-18 DIAGNOSIS — E1159 Type 2 diabetes mellitus with other circulatory complications: Secondary | ICD-10-CM

## 2013-05-18 DIAGNOSIS — Z720 Tobacco use: Secondary | ICD-10-CM

## 2013-05-18 DIAGNOSIS — E1151 Type 2 diabetes mellitus with diabetic peripheral angiopathy without gangrene: Secondary | ICD-10-CM

## 2013-05-18 DIAGNOSIS — E1339 Other specified diabetes mellitus with other diabetic ophthalmic complication: Secondary | ICD-10-CM

## 2013-05-18 LAB — COMPREHENSIVE METABOLIC PANEL
ALT: 11 U/L (ref 0–35)
Albumin: 3.4 g/dL — ABNORMAL LOW (ref 3.5–5.2)
Alkaline Phosphatase: 88 U/L (ref 39–117)
BUN: 35 mg/dL — ABNORMAL HIGH (ref 6–23)
CO2: 27 mEq/L (ref 19–32)
Calcium: 9.3 mg/dL (ref 8.4–10.5)
Chloride: 102 mEq/L (ref 96–112)
GFR: 90.95 mL/min (ref >60.00)
Glucose, Bld: 236 mg/dL — ABNORMAL HIGH (ref 70–99)
Potassium: 4.9 mEq/L (ref 3.5–5.1)
Sodium: 136 mEq/L (ref 135–145)

## 2013-05-18 LAB — LDL CHOLESTEROL, DIRECT: Direct LDL: 68.2 mg/dL

## 2013-05-18 LAB — MICROALBUMIN / CREATININE URINE RATIO
Creatinine,U: 53.7 mg/dL
Microalb, Ur: 4.1 mg/dL — ABNORMAL HIGH (ref 0.0–1.9)

## 2013-05-18 LAB — HEMOGLOBIN A1C: Hgb A1c MFr Bld: 8.1 % — ABNORMAL HIGH (ref 4.6–6.5)

## 2013-05-18 NOTE — Progress Notes (Signed)
Patient ID: Laura Reed, female   DOB: 1961/02/21, 52 y.o.   MRN: [id number]  Patient Active Problem List   Diagnosis Date Noted  . History of shingles 05/18/2013  . Preoperative evaluation of a medical condition to rule out surgical contraindications (TAR required) 04/06/2013  . Diabetic peripheral neuropathy associated with type 2 diabetes mellitus 04/06/2013  . Diabetic osteomyelitis 04/06/2013  . Chronic hip pain 01/16/2013  . Routine general medical examination at a health care facility 06/26/2012  . PVD (peripheral vascular disease)   . Tobacco abuse 04/29/2011  . Uncontrolled type 2 diabetes mellitus with peripheral circulatory disorder   . Hypertension   . Hemorrhoid     Subjective:  CC:   Chief Complaint  Patient presents with  . Follow-up    HPI:   Laura Reed a 52 y.o. female who presents Follow up on uncontrolled DM.  Her  last a1c was 8.0 in July.  At her last  visit one month ago, her  70/30 evening insulin was increased by 3 units. Laura Reed are < 125 80% of the time   Pre lunch:  all < 150 and pre dinner all < 150.    CAD: PAD: Heart cath was done by Laura Reed due to abnormal stress test preoperatively.  No blockages.  6 hours surgery planned for nov 21st,  Carotid artery stenosis found at 70% right  Side,  But intervention has been postponede to avoid iatrogenic hypotension post operatively  that might compromise the angioplasty that was recently done on the right leg.   Half of great toe on the left was amputated recently  due to nonhealing ulcer that led to osteomyelitis,  With post op wound care being managed by Laura Reed    Past Medical History  Diagnosis Date  . Diabetes mellitus   . Hypertension   . Hemorrhoid   . PVD (peripheral vascular disease)     Past Surgical History  Procedure Laterality Date  . Coronary artery bypass graft  2009    3 vessel  . Arterial bypass surgry  2009, 2013 x 2    right leg , done in [place]  . Cesarean section     . Cholecystectomy  oct 2013    gallstones,  [facility]       The following portions of the patient's history were reviewed and updated as appropriate: Allergies, current medications, and problem list.    Review of Systems:   12 Pt  review of systems was negative except those addressed in the HPI,     History   Social History  . Marital Status: Legally Separated    Spouse Name: N/A    Number of Children: N/A  . Years of Education: N/A   Occupational History  . Not on file.   Social History Main Topics  . Smoking status: Former Smoker    Types: Cigarettes    Quit date: 03/30/2011  . Smokeless tobacco: Never Used  . Alcohol Use: No  . Drug Use: No  . Sexual Activity: Not on file   Other Topics Concern  . Not on file   Social History Narrative  . No narrative on file    Objective:  Filed Vitals:   05/18/13 1025  BP: 136/72  Pulse: 98  Temp: 98.8 F (37.1 C)  Resp: 12     General appearance: alert, cooperative and appears stated age Ears: normal TM's and external ear canals both ears Throat: lips, mucosa, and tongue normal; teeth  and gums normal Neck: no adenopathy, no carotid bruit, supple, symmetrical, trachea midline and thyroid not enlarged, symmetric, no tenderness/mass/nodules Back: symmetric, no curvature. ROM normal. No CVA tenderness. Lungs: clear to auscultation bilaterally Heart: regular rate and rhythm, S1, S2 normal, no murmur, click, rub or gallop Abdomen: soft, non-tender; bowel sounds normal; no masses,  no organomegaly Pulses: 2+ and symmetric Skin: Skin color, texture, turgor normal. No rashes or lesions Lymph nodes: Cervical, supraclavicular, and axillary nodes normal.  Assessment and Plan:  Uncontrolled type 2 diabetes mellitus with peripheral circulatory disorder A1C was 8.0 in mid July and is now 8.1  Since her fasting sugars are controlled but her pre meal sugars are still  elevated   I will lincrease her morning dose of 70/30  insulin to 27 units iand Continue 27 units in the evening and 30 units of  Basal insulin (Levemir)  daily.     PVD (peripheral vascular disease) Severe, recurrent With bilateral LE occlusive disease and signficant stenosis, which if addressed prior to addrressing her LE occlsuice disease could risk loss of both legs.  She is  S/p amputation of great toe, distal portion,  And is set to undergo extensive revascularization o foccluded left iliac next week.   Hypertension Well controlled on current regimen. Renal function stable, no changes today.  Lab Results  Component Value Date   CREATININE 0.7 05/18/2013    A total of 40 minutes was spent with patient more than half of which was spent in counseling, reviewing records from other prviders and coordination of care.   Updated Medication List Outpatient Encounter Prescriptions as of 05/18/2013  Medication Sig  . ACCU-CHEK AVIVA PLUS test strip CHECK BLOOD SUGAR 2 TO 3 TIMES DAILY  . ALPRAZolam (XANAX) 0.5 MG tablet TAKE 1 TABLET BY MOUTH THREE TIMES DAILY AS NEEDED  . aspirin 81 MG tablet Take 81 mg by mouth daily.  Marland Kitchen atorvastatin (LIPITOR) 40 MG tablet Take 0.5 tablets (20 mg total) by mouth daily.  . carvedilol (COREG) 6.25 MG tablet TAKE 1 TABLET BY MOUTH TWICE DAILY WITH MEALS  . Cholecalciferol (VITAMIN D3) 2000 UNITS TABS Take 1 capsule by mouth daily.  . clopidogrel (PLAVIX) 75 MG tablet TAKE 1 TABLET BY MOUTH EVERY DAY  . DIGOX 125 MCG tablet TAKE 1 TABLET BY MOUTH EVERY DAY  . doxycycline (ADOXA) 100 MG tablet Take 100 mg by mouth 2 (two) times daily.  Marland Kitchen gabapentin (NEURONTIN) 600 MG tablet Take 600 mg by mouth 3 (three) times daily.    . Insulin Aspart Prot & Aspart (NOVOLOG MIX 70/30 FLEXPEN) (70-30) 100 UNIT/ML SUPN 24 units in the am and 27 units in the pm before meal  . Insulin Pen Needle (B-D ULTRAFINE III SHORT PEN) 31G X 8 MM MISC USE WITH PEN TO ADMINISTER INSULIN TWICE DAILY  . Lancets MISC Use as directed to check  sugars 4 times daily   Uncontrolled diabetes  . LEVEMIR FLEXPEN 100 UNIT/ML injection INJECT 30 UNITS UNDER THE SKIN EVERY NIGHT AT BEDTIME  . lisinopril (PRINIVIL,ZESTRIL) 5 MG tablet TAKE 1 TABLET BY MOUTH EVERY DAY  . [DISCONTINUED] doxycycline (VIBRA-TABS) 100 MG tablet Take 1 tablet by mouth 2 (two) times daily.

## 2013-05-18 NOTE — Progress Notes (Signed)
Pre-visit discussion using our clinic review tool. No additional management support is needed unless otherwise documented below in the visit note.  

## 2013-05-18 NOTE — Patient Instructions (Signed)
Your diabetes is under good control on current regimen,  No medication changes are needed at this time.   We will call you with the resutls of your labs from today .     Please return in 3 months

## 2013-05-19 ENCOUNTER — Encounter: Payer: Self-pay | Admitting: Internal Medicine

## 2013-05-19 ENCOUNTER — Ambulatory Visit: Payer: Self-pay | Admitting: Vascular Surgery

## 2013-05-19 LAB — BASIC METABOLIC PANEL
Calcium, Total: 9 mg/dL (ref 8.5–10.1)
Creatinine: 0.6 mg/dL (ref 0.60–1.30)
EGFR (African American): >60
EGFR (Non-African Amer.): >60
Sodium: 134 mmol/L — ABNORMAL LOW (ref 136–145)

## 2013-05-19 LAB — CBC
HGB: 12.2 g/dL (ref 12.0–16.0)
MCHC: 34.7 g/dL (ref 32.0–36.0)
MCV: 83 fL (ref 80–100)
RBC: 4.22 10*6/uL (ref 3.80–5.20)
WBC: 11.8 10*3/uL — ABNORMAL HIGH (ref 3.6–11.0)

## 2013-05-19 MED ORDER — INSULIN ASPART PROT & ASPART (70-30 MIX) 100 UNIT/ML PEN
PEN_INJECTOR | SUBCUTANEOUS | Status: DC
Start: 2013-05-19 — End: 2013-05-30

## 2013-05-19 NOTE — Assessment & Plan Note (Signed)
A1C was 8.0 in mid July and is now 8.1  Since her fasting sugars are controlled but her pre meal sugars are still  elevated   I will lincrease her morning dose of 70/30 insulin to 27 units iand Continue 27 units in the evening and 30 units of  Basal insulin (Levemir)  daily.

## 2013-05-19 NOTE — Assessment & Plan Note (Addendum)
Severe, recurrent With bilateral LE occlusive disease and signficant stenosis, which if addressed prior to addrressing her LE occlsuice disease could risk loss of both legs.  She is  S/p amputation of great toe, distal portion,  And is set to undergo extensive revascularization o foccluded left iliac next week.

## 2013-05-19 NOTE — Assessment & Plan Note (Signed)
Well controlled on current regimen. Renal function stable, no changes today.  Lab Results  Component Value Date   CREATININE 0.7 05/18/2013

## 2013-05-24 MED ORDER — ATORVASTATIN CALCIUM 20 MG PO TABS: 20.0000 mg | ORAL_TABLET | Freq: Every day | ORAL | Status: DC

## 2013-05-24 NOTE — Addendum Note (Signed)
Addended by: Sherlene Shams on: 05/24/2013 01:49 PM   Modules accepted: Orders

## 2013-05-26 ENCOUNTER — Other Ambulatory Visit: Payer: Self-pay | Admitting: Physician Assistant

## 2013-05-26 LAB — URINALYSIS, COMPLETE
Bacteria: NONE SEEN
Ketone: NEGATIVE
Nitrite: NEGATIVE
Ph: 6 (ref 4.5–8.0)
Protein: NEGATIVE
RBC,UR: <1 /HPF (ref 0–5)
Specific Gravity: 1.006 (ref 1.003–1.030)
Squamous Epithelial: NONE SEEN

## 2013-05-27 ENCOUNTER — Telehealth: Payer: Self-pay | Admitting: Internal Medicine

## 2013-05-27 DIAGNOSIS — E1151 Type 2 diabetes mellitus with diabetic peripheral angiopathy without gangrene: Secondary | ICD-10-CM

## 2013-05-27 NOTE — Telephone Encounter (Signed)
Pt left vm.  Pt states refill of Novolog 70/30 needed for new dosing, 27 units a.m., 27 units p.m.

## 2013-05-30 MED ORDER — INSULIN ASPART PROT & ASPART (70-30 MIX) 100 UNIT/ML PEN: PEN_INJECTOR | SUBCUTANEOUS | Status: DC

## 2013-05-30 NOTE — Telephone Encounter (Signed)
Script sent as requested with new dosing.

## 2013-06-01 ENCOUNTER — Inpatient Hospital Stay: Payer: Self-pay | Admitting: Vascular Surgery

## 2013-06-02 LAB — CBC WITH DIFFERENTIAL/PLATELET
Basophil #: 0.1 10*3/uL (ref 0.0–0.1)
Eosinophil %: 1 %
HCT: 26.4 % — ABNORMAL LOW (ref 35.0–47.0)
HGB: 9 g/dL — ABNORMAL LOW (ref 12.0–16.0)
MCH: 28.4 pg (ref 26.0–34.0)
MCHC: 34 g/dL (ref 32.0–36.0)
Monocyte #: 1.2 x10 3/mm — ABNORMAL HIGH (ref 0.2–0.9)
Monocyte %: 9.5 %
Neutrophil %: 78.1 %
RBC: 3.17 10*6/uL — ABNORMAL LOW (ref 3.80–5.20)

## 2013-06-02 LAB — BASIC METABOLIC PANEL
Anion Gap: 3 — ABNORMAL LOW (ref 7–16)
Creatinine: 0.65 mg/dL (ref 0.60–1.30)
EGFR (African American): >60
EGFR (Non-African Amer.): >60
Glucose: 301 mg/dL — ABNORMAL HIGH (ref 65–99)
Osmolality: 285 (ref 275–301)
Potassium: 4.6 mmol/L (ref 3.5–5.1)

## 2013-06-03 LAB — CBC WITH DIFFERENTIAL/PLATELET
Basophil #: 0 10*3/uL (ref 0.0–0.1)
Basophil %: 0.3 %
Eosinophil #: 0.1 10*3/uL (ref 0.0–0.7)
Eosinophil %: 1 %
HGB: 8 g/dL — ABNORMAL LOW (ref 12.0–16.0)
Lymphocyte #: 1.3 10*3/uL (ref 1.0–3.6)
Lymphocyte %: 12.2 %
MCH: 28.8 pg (ref 26.0–34.0)
MCV: 83 fL (ref 80–100)
Monocyte %: 10.3 %
Neutrophil %: 76.2 %
RDW: 13.9 % (ref 11.5–14.5)
WBC: 11 10*3/uL (ref 3.6–11.0)

## 2013-06-03 LAB — COMPREHENSIVE METABOLIC PANEL
Albumin: 2.2 g/dL — ABNORMAL LOW (ref 3.4–5.0)
Alkaline Phosphatase: 74 U/L
Anion Gap: 6 — ABNORMAL LOW (ref 7–16)
Calcium, Total: 7.6 mg/dL — ABNORMAL LOW (ref 8.5–10.1)
Creatinine: 0.72 mg/dL (ref 0.60–1.30)
Glucose: 281 mg/dL — ABNORMAL HIGH (ref 65–99)
Osmolality: 278 (ref 275–301)
Potassium: 4 mmol/L (ref 3.5–5.1)
SGOT(AST): 7 U/L — ABNORMAL LOW (ref 15–37)
SGPT (ALT): 10 U/L — ABNORMAL LOW (ref 12–78)
Sodium: 134 mmol/L — ABNORMAL LOW (ref 136–145)

## 2013-06-04 LAB — CBC WITH DIFFERENTIAL/PLATELET
Basophil #: 0 10*3/uL (ref 0.0–0.1)
Eosinophil #: 0.2 10*3/uL (ref 0.0–0.7)
Eosinophil %: 2 %
Lymphocyte %: 9.5 %
MCH: 27.9 pg (ref 26.0–34.0)
MCV: 84 fL (ref 80–100)
Monocyte %: 9.9 %
Neutrophil #: 9.6 10*3/uL — ABNORMAL HIGH (ref 1.4–6.5)
Platelet: 200 10*3/uL (ref 150–440)
RBC: 2.75 10*6/uL — ABNORMAL LOW (ref 3.80–5.20)
WBC: 12.2 10*3/uL — ABNORMAL HIGH (ref 3.6–11.0)

## 2013-06-06 LAB — CBC WITH DIFFERENTIAL/PLATELET
Basophil %: 0.6 %
Eosinophil #: 0.3 10*3/uL (ref 0.0–0.7)
Eosinophil %: 3.3 %
HGB: 7.8 g/dL — ABNORMAL LOW (ref 12.0–16.0)
Lymphocyte #: 1.4 10*3/uL (ref 1.0–3.6)
Lymphocyte %: 13.5 %
MCH: 28.7 pg (ref 26.0–34.0)
MCV: 82 fL (ref 80–100)
Monocyte #: 0.9 x10 3/mm (ref 0.2–0.9)
RBC: 2.73 10*6/uL — ABNORMAL LOW (ref 3.80–5.20)
RDW: 13.8 % (ref 11.5–14.5)

## 2013-06-06 LAB — BASIC METABOLIC PANEL
Anion Gap: 3 — ABNORMAL LOW (ref 7–16)
BUN: 13 mg/dL (ref 7–18)
Chloride: 104 mmol/L (ref 98–107)
Co2: 31 mmol/L (ref 21–32)
EGFR (African American): >60
Glucose: 175 mg/dL — ABNORMAL HIGH (ref 65–99)
Sodium: 138 mmol/L (ref 136–145)

## 2013-06-07 LAB — CBC WITH DIFFERENTIAL/PLATELET
Lymphocyte %: 19.7 %
MCH: 28.4 pg (ref 26.0–34.0)
MCHC: 34.4 g/dL (ref 32.0–36.0)
Monocyte %: 10.2 %
Neutrophil #: 5.4 10*3/uL (ref 1.4–6.5)
Platelet: 320 10*3/uL (ref 150–440)
RBC: 2.79 10*6/uL — ABNORMAL LOW (ref 3.80–5.20)
WBC: 8.4 10*3/uL (ref 3.6–11.0)

## 2013-06-07 LAB — PATHOLOGY REPORT

## 2013-06-07 LAB — WOUND CULTURE

## 2013-06-08 LAB — CBC WITH DIFFERENTIAL/PLATELET
Eosinophil #: 0.4 10*3/uL (ref 0.0–0.7)
HGB: 8.8 g/dL — ABNORMAL LOW (ref 12.0–16.0)
Lymphocyte %: 15.4 %
Monocyte %: 8.8 %
Neutrophil %: 71.6 %
Platelet: 383 10*3/uL (ref 150–440)
RBC: 3.13 10*6/uL — ABNORMAL LOW (ref 3.80–5.20)
RDW: 14 % (ref 11.5–14.5)
WBC: 11.5 10*3/uL — ABNORMAL HIGH (ref 3.6–11.0)

## 2013-06-28 ENCOUNTER — Other Ambulatory Visit: Payer: Self-pay | Admitting: Podiatry

## 2013-08-02 ENCOUNTER — Ambulatory Visit: Payer: Self-pay | Admitting: Vascular Surgery

## 2013-08-02 LAB — BASIC METABOLIC PANEL
Anion Gap: 5 — ABNORMAL LOW (ref 7–16)
BUN: 27 mg/dL — ABNORMAL HIGH (ref 7–18)
CO2: 26 mmol/L (ref 21–32)
CREATININE: 0.83 mg/dL (ref 0.60–1.30)
Calcium, Total: 8.9 mg/dL (ref 8.5–10.1)
Chloride: 105 mmol/L (ref 98–107)
GLUCOSE: 115 mg/dL — AB (ref 65–99)
Osmolality: 278 (ref 275–301)
POTASSIUM: 4 mmol/L (ref 3.5–5.1)
Sodium: 136 mmol/L (ref 136–145)

## 2013-08-15 ENCOUNTER — Inpatient Hospital Stay: Payer: Self-pay | Admitting: Podiatry

## 2013-08-15 ENCOUNTER — Other Ambulatory Visit: Payer: Self-pay | Admitting: Podiatry

## 2013-08-15 LAB — CBC WITH DIFFERENTIAL/PLATELET
Basophil #: 0.2 10*3/uL — ABNORMAL HIGH (ref 0.0–0.1)
Basophil %: 1.4 %
Eosinophil #: 0.2 10*3/uL (ref 0.0–0.7)
Eosinophil %: 2.3 %
HCT: 38.2 % (ref 35.0–47.0)
HGB: 12.8 g/dL (ref 12.0–16.0)
LYMPHS PCT: 19.3 %
Lymphocyte #: 2 10*3/uL (ref 1.0–3.6)
MCH: 27.8 pg (ref 26.0–34.0)
MCHC: 33.6 g/dL (ref 32.0–36.0)
MCV: 83 fL (ref 80–100)
Monocyte #: 0.8 x10 3/mm (ref 0.2–0.9)
Monocyte %: 7.4 %
NEUTROS PCT: 69.6 %
Neutrophil #: 7.3 10*3/uL — ABNORMAL HIGH (ref 1.4–6.5)
Platelet: 298 10*3/uL (ref 150–440)
RBC: 4.62 10*6/uL (ref 3.80–5.20)
RDW: 14.6 % — ABNORMAL HIGH (ref 11.5–14.5)
WBC: 10.6 10*3/uL (ref 3.6–11.0)

## 2013-08-15 LAB — BASIC METABOLIC PANEL
Anion Gap: 4 — ABNORMAL LOW (ref 7–16)
BUN: 26 mg/dL — AB (ref 7–18)
CO2: 28 mmol/L (ref 21–32)
CREATININE: 0.75 mg/dL (ref 0.60–1.30)
Calcium, Total: 9.5 mg/dL (ref 8.5–10.1)
Chloride: 105 mmol/L (ref 98–107)
EGFR (African American): >60
EGFR (Non-African Amer.): >60
Glucose: 208 mg/dL — ABNORMAL HIGH (ref 65–99)
Osmolality: 285 (ref 275–301)
Potassium: 4.2 mmol/L (ref 3.5–5.1)
Sodium: 137 mmol/L (ref 136–145)

## 2013-08-16 LAB — HEMOGLOBIN A1C: Hemoglobin A1C: 8 % — ABNORMAL HIGH (ref 4.2–6.3)

## 2013-08-17 LAB — CBC WITH DIFFERENTIAL/PLATELET
BASOS PCT: 0.7 %
Basophil #: 0.1 10*3/uL (ref 0.0–0.1)
Eosinophil #: 0.3 10*3/uL (ref 0.0–0.7)
Eosinophil %: 3.3 %
HCT: 36.3 % (ref 35.0–47.0)
HGB: 12.5 g/dL (ref 12.0–16.0)
LYMPHS PCT: 18.5 %
Lymphocyte #: 1.7 10*3/uL (ref 1.0–3.6)
MCH: 28.4 pg (ref 26.0–34.0)
MCHC: 34.5 g/dL (ref 32.0–36.0)
MCV: 82 fL (ref 80–100)
MONOS PCT: 7.4 %
Monocyte #: 0.7 x10 3/mm (ref 0.2–0.9)
Neutrophil #: 6.2 10*3/uL (ref 1.4–6.5)
Neutrophil %: 70.1 %
PLATELETS: 270 10*3/uL (ref 150–440)
RBC: 4.4 10*6/uL (ref 3.80–5.20)
RDW: 14.2 % (ref 11.5–14.5)
WBC: 8.9 10*3/uL (ref 3.6–11.0)

## 2013-08-19 LAB — PATHOLOGY REPORT

## 2013-08-19 LAB — WOUND CULTURE

## 2013-08-27 ENCOUNTER — Other Ambulatory Visit: Payer: Self-pay | Admitting: Internal Medicine

## 2013-10-11 ENCOUNTER — Other Ambulatory Visit: Payer: Self-pay | Admitting: Internal Medicine

## 2013-10-16 ENCOUNTER — Other Ambulatory Visit: Payer: Self-pay | Admitting: Internal Medicine

## 2013-10-17 NOTE — Telephone Encounter (Signed)
Electronic Rx request please advise.

## 2013-10-31 ENCOUNTER — Other Ambulatory Visit: Payer: Self-pay | Admitting: Internal Medicine

## 2013-11-14 ENCOUNTER — Encounter: Payer: Self-pay | Admitting: Internal Medicine

## 2013-11-14 ENCOUNTER — Encounter (INDEPENDENT_AMBULATORY_CARE_PROVIDER_SITE_OTHER): Payer: Self-pay

## 2013-11-14 ENCOUNTER — Ambulatory Visit (INDEPENDENT_AMBULATORY_CARE_PROVIDER_SITE_OTHER): Payer: Medicare Other | Admitting: Internal Medicine

## 2013-11-14 VITALS — BP 153/70 | HR 59 | Temp 98.2°F | Resp 16 | Wt 173.2 lb

## 2013-11-14 DIAGNOSIS — E1142 Type 2 diabetes mellitus with diabetic polyneuropathy: Secondary | ICD-10-CM

## 2013-11-14 DIAGNOSIS — R002 Palpitations: Secondary | ICD-10-CM

## 2013-11-14 DIAGNOSIS — E1149 Type 2 diabetes mellitus with other diabetic neurological complication: Secondary | ICD-10-CM

## 2013-11-14 DIAGNOSIS — E1165 Type 2 diabetes mellitus with hyperglycemia: Secondary | ICD-10-CM

## 2013-11-14 DIAGNOSIS — E1151 Type 2 diabetes mellitus with diabetic peripheral angiopathy without gangrene: Secondary | ICD-10-CM

## 2013-11-14 DIAGNOSIS — IMO0002 Reserved for concepts with insufficient information to code with codable children: Secondary | ICD-10-CM

## 2013-11-14 DIAGNOSIS — I499 Cardiac arrhythmia, unspecified: Secondary | ICD-10-CM

## 2013-11-14 DIAGNOSIS — E1159 Type 2 diabetes mellitus with other circulatory complications: Secondary | ICD-10-CM

## 2013-11-14 LAB — BASIC METABOLIC PANEL
BUN: 16 mg/dL (ref 6–23)
CHLORIDE: 101 meq/L (ref 96–112)
CO2: 28 meq/L (ref 19–32)
Calcium: 9.7 mg/dL (ref 8.4–10.5)
Creatinine, Ser: 0.7 mg/dL (ref 0.4–1.2)
GFR: 95.42 mL/min (ref >60.00)
GLUCOSE: 180 mg/dL — AB (ref 70–99)
POTASSIUM: 4.6 meq/L (ref 3.5–5.1)
Sodium: 138 mEq/L (ref 135–145)

## 2013-11-14 LAB — TSH: TSH: 1.32 u[IU]/mL (ref 0.35–4.50)

## 2013-11-14 LAB — HM DIABETES FOOT EXAM: HM DIABETIC FOOT EXAM: ABNORMAL

## 2013-11-14 LAB — HEMOGLOBIN A1C: Hgb A1c MFr Bld: 8 % — ABNORMAL HIGH (ref 4.6–6.5)

## 2013-11-14 LAB — MICROALBUMIN / CREATININE URINE RATIO
Creatinine,U: 19 mg/dL
MICROALB UR: 3.3 mg/dL — AB (ref 0.0–1.9)
Microalb Creat Ratio: 17.4 mg/g (ref 0.0–30.0)

## 2013-11-14 LAB — MAGNESIUM: Magnesium: 1.9 mg/dL (ref 1.5–2.5)

## 2013-11-14 NOTE — Progress Notes (Signed)
Pre-visit discussion using our clinic review tool. No additional management support is needed unless otherwise documented below in the visit note.  

## 2013-11-14 NOTE — Assessment & Plan Note (Addendum)
She has neuropathy and PAd with prior amputatations resulting in  pressure ulcers resulting from surgically induced  anatomical  Changfes.  Order for special shoes and inserts done

## 2013-11-14 NOTE — Patient Instructions (Signed)
Increase the evening dose of 70/30 by 3 units  (from 27 to 30 units) for the next 3 nights   Your Goal is  To get your  fasting sugars down to  100 to 130.  You can Increase the evening  dose every 3 days by 3 units (not every night,  Every 3 nights!)  Use my diet instructions to find more low carb food choices  This is  One version of a  "Low GI"  Diet:  It will still lower your blood sugars and allow you to lose 4 to 8  lbs  per month if you follow it carefully.  Your goal with exercise is a minimum of 30 minutes of aerobic exercise 5 days per week (Walking does not count once it becomes easy!)    All of the foods can be found at grocery stores and in bulk at [employer].  The Atkins protein bars and shakes are available in more varieties at [employer], [employer] and [place].     7 AM Breakfast:  Choose from the following:  Low carbohydrate Protein  Shakes (I recommend the EAS AdvantEdge "Carb Control" shakes  Or the low carb shakes by Atkins.    2.5 carbs   Arnold's "Sandwhich Thin"toasted  w/ peanut butter (no jelly: about 20 net carbs  "Bagel Thin" with cream cheese and salmon: about 20 carbs   a scrambled egg/bacon/cheese burrito made with Mission's "carb balance" whole wheat tortilla  (about 10 net carbs )   Avoid cereal and bananas, oatmeal and cream of wheat and grits. They are loaded with carbohydrates!   10 AM: high protein snack  Protein bar by Atkins (the snack size, under 200 cal, usually < 6 net carbs).    A stick of cheese:  Around 1 carb,  100 cal     Dannon Light n Fit [place] Yogurt  (80 cal, 8 carbs)  Other so called "protein bars" and Greek yogurts tend to be loaded with carbohydrates.  Remember, in food advertising, the word "energy" is synonymous for " carbohydrate."  Lunch:   A Sandwich using the bread choices listed, Can use any  Eggs,  lunchmeat, grilled meat or canned tuna), avocado, regular mayo/mustard  and cheese.  A Salad using blue cheese, ranch,  Goddess or  vinagrette,  No croutons or "confetti" and no "candied nuts" but regular nuts OK.   No pretzels or chips.  Pickles and miniature sweet peppers are a good low carb alternative that provide a "crunch"  The bread is the only source of carbohydrate in a sandwich and  can be decreased by trying some of these alternatives to traditional loaf bread  Joseph's makes a pita bread and a flat bread that are 50 cal and 4 net carbs available at [place] and [place].  This can be toasted to use with hummous as well  Toufayan makes a low carb flatbread that's 100 cal and 9 net carbs available at [employer] and [employer] makes 2 sizes of  Low carb whole wheat tortilla  (The large one is 210 cal and 6 net carbs) Avoid "Low fat dressings, as well as Barry Brunner and [place] dressings They are loaded with sugar!   3 PM/ Mid day  Snack:  Consider  1 ounce of  almonds, walnuts, pistachios, pecans, peanuts,  Macadamia nuts or a nut medley.  Avoid "granola"; the dried cranberries and raisins are loaded with carbohydrates. Mixed nuts as long as there are  no raisins,  cranberries or dried fruit.    Try the prosciutto/mozzarella cheese sticks by Fiorruci  In deli /backery section   High protein      6 PM  Dinner:     Meat/fowl/fish with a green salad, and either broccoli, cauliflower, green beans, spinach, brussel sprouts or  Lima beans. DO NOT BREAD THE PROTEIN!!      There is a low carb pasta by Dreamfield's that is acceptable and tastes great: only 5 digestible carbs/serving.( All grocery stores but BJs carry it )  Try Hurley Cisco Angelo's chicken piccata or chicken or eggplant parm over low carb pasta.(Lowes and BJs)   Marjory Lies Sanchez's "Carnitas" (pulled pork, no sauce,  0 carbs) or his beef pot roast to make a dinner burrito (at [employer]J's)  Pesto over low carb pasta (bj's sells a good quality pesto in the center refrigerated section of the deli   Try satueeing  Cheral Marker with mushroooms  Whole wheat pasta is still full of  digestible carbs and  Not as low in glycemic index as Dreamfield's.   Brown rice is still rice,  So skip the rice and noodles if you eat [place] or [place] (or at least limit to 1/2 cup)  9 PM snack :   Breyer's "low carb" fudgsicle or  ice cream bar (Carb Smart line), or  Weight Watcher's ice cream bar , or another "no sugar added" ice cream;  a serving of fresh berries/cherries with whipped cream   Cheese or DANNON'S LlGHT N FIT GREEK YOGURT  8 ounces of [place] unsweetened almond/cococunut milk    Avoid bananas, pineapple, grapes  and watermelon on a regular basis because they are high in sugar.  THINK OF THEM AS DESSERT  Remember that snack Substitutions should be less than 10 NET carbs per serving and meals < 20 carbs. Remember to subtract fiber grams to get the "net carbs."

## 2013-11-14 NOTE — Assessment & Plan Note (Signed)
12 lead EKG was done today to rule out Atrial fib.  SR with frequent pvc's was noted.  Lytes and tsh ordered.

## 2013-11-14 NOTE — Progress Notes (Signed)
Patient ID: Laura Reed, female   DOB: 03-22-1961, 53 y.o.   MRN: [id number]   Patient Active Problem List   Diagnosis Date Noted  . Arrhythmia 11/14/2013  . History of shingles 05/18/2013  . Preoperative evaluation of a medical condition to rule out surgical contraindications (TAR required) 04/06/2013  . Diabetic peripheral neuropathy associated with type 2 diabetes mellitus 04/06/2013  . Diabetic osteomyelitis 04/06/2013  . Chronic hip pain 01/16/2013  . Routine general medical examination at a health care facility 06/26/2012  . PVD (peripheral vascular disease)   . History of tobacco abuse 04/29/2011  . Uncontrolled type 2 diabetes mellitus with peripheral circulatory disorder   . Hypertension   . Hemorrhoid     Subjective:  CC:   Chief Complaint  Patient presents with  . Follow-up    on feet    HPI:   Laura Reed is a 53 y.o. female who presents for Follow up on DM Type 2, Uncontrolled, complicated by severe PAD with multiple  interventions since last visit including amputation of great toe on the left.  S/p Fem pop  Revision  On the left  In March  Eventually had complete amputation of great toe left foot.  Since then developed fractures of toes on left foot with exposed bone requiring debridement.  Has lost big toe and middle toe on left, and the 4th 5th on the right..  No balance issues,  No falls,  Walking fine  Using the orthotic shoes but needs special shoes and inserts  Has a pressure sore on the right foot due to the missing toes  wearing a boot   Form filled for shoe inserts recommended by podiatry [facility]  DM:  Sugars have been elevated despite making changes to diet.  Using 70/30 27 units twice daily.  150 to 210 post prandial,  And Fasting 170 to 200  Heart beat very irregular today    Past Medical History  Diagnosis Date  . Diabetes mellitus   . Hypertension   . Hemorrhoid   . PVD (peripheral vascular disease)     Past Surgical History   Procedure Laterality Date  . Coronary artery bypass graft  2009    3 vessel  . Arterial bypass surgry  2009, 2013 x 2    right leg , done in [place]  . Cesarean section    . Cholecystectomy  oct 2013    gallstones,  [place]       The following portions of the patient's history were reviewed and updated as appropriate: Allergies, current medications, and problem list.    Review of Systems:   Patient denies headache, fevers, malaise, unintentional weight loss, skin rash, eye pain, sinus congestion and sinus pain, sore throat, dysphagia,  hemoptysis , cough, dyspnea, wheezing, chest pain, palpitations, orthopnea, edema, abdominal pain, nausea, melena, diarrhea, constipation, flank pain, dysuria, hematuria, urinary  Frequency, nocturia, numbness, tingling, seizures,  Focal weakness, Loss of consciousness,  Tremor, insomnia, depression, anxiety, and suicidal ideation.     History   Social History  . Marital Status: Legally Separated    Spouse Name: N/A    Number of Children: N/A  . Years of Education: N/A   Occupational History  . Not on file.   Social History Main Topics  . Smoking status: Former Smoker    Types: Cigarettes    Quit date: 03/30/2011  . Smokeless tobacco: Never Used  . Alcohol Use: No  . Drug Use: No  . Sexual Activity:  Not Currently   Other Topics Concern  . Not on file   Social History Narrative  . No narrative on file    Objective:  Filed Vitals:   11/14/13 1027  BP: 153/70  Pulse: 59  Temp: 98.2 F (36.8 C)  Resp: 16     General appearance: alert, cooperative and appears stated age Ears: normal TM's and external ear canals both ears Throat: lips, mucosa, and tongue normal; teeth and gums normal Neck: no adenopathy, no carotid bruit, supple, symmetrical, trachea midline and thyroid not enlarged, symmetric, no tenderness/mass/nodules Back: symmetric, no curvature. ROM normal. No CVA tenderness. Lungs: clear to auscultation  bilaterally Heart: regular rate and rhythm, S1, S2 normal, no murmur, click, rub or gallop Abdomen: soft, non-tender; bowel sounds normal; no masses,  no organomegaly Pulses: 2+ and symmetric Skin: Skin color, texture, turgor normal. No rashes or lesions Lymph nodes: Cervical, supraclavicular, and axillary nodes normal.  Assessment and Plan:  Arrhythmia 12 lead EKG was done today to rule out Atrial fib.  SR with frequent pvc's was noted.  Lytes and tsh ordered.   Diabetic peripheral neuropathy associated with type 2 diabetes mellitus She has neuropathy and PAd with prior amputatations resulting in  pressure ulcers resulting from surgically induced  anatomical  Changfes.  Order for special shoes and inserts done   Uncontrolled type 2 diabetes mellitus with peripheral circulatory disorder Insulin dose was increased today  To 30 units in the evening. rx for shoe inserts and special diabetic shoes given as per podiatry recommendations.hemoglobin A1c is elevated at 8.0 She is up-to-date on eye exams and has ongong vascular and podiatry folow up for neuropathy and PAD. Marland Kitchen Lab Results  Component Value Date   HGBA1C 8.0* 11/14/2013    Lab Results  Component Value Date   CREATININE 0.7 11/14/2013   Lab Results  Component Value Date   MICROALBUR 3.3* 11/14/2013   A total of 40 minutes was spent with patient more than half of which was spent in counseling, reviewing records from other prviders and coordination of care.  Updated Medication List Outpatient Encounter Prescriptions as of 11/14/2013  Medication Sig  . ACCU-CHEK AVIVA PLUS test strip CHECK BLOOD SUGAR 2 TO 3 TIMES DAILY  . ALPRAZolam (XANAX) 0.5 MG tablet TAKE 1 TABLET BY MOUTH THREE TIMES DAILY AS NEEDED  . aspirin 81 MG tablet Take 81 mg by mouth daily.  Marland Kitchen atorvastatin (LIPITOR) 20 MG tablet Take 1 tablet (20 mg total) by mouth daily.  . Biotin 1000 MCG tablet Take 1,000 mcg by mouth 3 (three) times daily.  . Cholecalciferol  (VITAMIN D3) 2000 UNITS TABS Take 1 capsule by mouth daily.  . clopidogrel (PLAVIX) 75 MG tablet TAKE 1 TABLET BY MOUTH EVERY DAY  . DIGOX 125 MCG tablet TAKE 1 TABLET BY MOUTH EVERY DAY  . gabapentin (NEURONTIN) 600 MG tablet Take 600 mg by mouth 3 (three) times daily.    . Insulin Aspart Prot & Aspart (NOVOLOG MIX 70/30 FLEXPEN) (70-30) 100 UNIT/ML SUPN 27 units in the am and 27 units in the pm before meal  . Insulin Pen Needle (B-D ULTRAFINE III SHORT PEN) 31G X 8 MM MISC USE WITH PEN TO ADMINISTER INSULIN TWICE DAILY  . Lancets MISC Use as directed to check sugars 4 times daily   Uncontrolled diabetes  . LEVEMIR FLEXTOUCH 100 UNIT/ML Pen INJECT 30 UNITS UNDER THE SKIN AT BEDTIME  . Pyridoxine HCl (VITAMIN B-6) 500 MG tablet Take 500 mg by mouth  daily.  . vitamin C (ASCORBIC ACID) 500 MG tablet Take 1,000 mg by mouth 2 (two) times daily.  . carvedilol (COREG) 6.25 MG tablet TAKE 1 TABLET BY MOUTH TWICE DAILY WITH MEALS  . doxycycline (ADOXA) 100 MG tablet Take 100 mg by mouth 2 (two) times daily.  Marland Kitchen lisinopril (PRINIVIL,ZESTRIL) 5 MG tablet TAKE 1 TABLET BY MOUTH EVERY DAY  . [DISCONTINUED] NOVOLOG MIX 70/30 FLEXPEN (70-30) 100 UNIT/ML Pen INJECT 20 UNITS INTO THE SKIN TWICE DAILY WITH A MEAL.     Orders Placed This Encounter  Procedures  . Basic metabolic panel  . Magnesium  . Hemoglobin A1c  . TSH  . Microalbumin / creatinine urine ratio  . EKG 12-Lead  . HM DIABETES FOOT EXAM    No Follow-up on file.

## 2013-11-15 NOTE — Assessment & Plan Note (Addendum)
Insulin dose was increased today  To 30 units in the evening. rx for shoe inserts and special diabetic shoes given as per podiatry recommendations.hemoglobin A1c is elevated at 8.0 She is up-to-date on eye exams and has ongong vascular and podiatry folow up for neuropathy and PAD. Marland Kitchen Lab Results  Component Value Date   HGBA1C 8.0* 11/14/2013    Lab Results  Component Value Date   CREATININE 0.7 11/14/2013   Lab Results  Component Value Date   MICROALBUR 3.3* 11/14/2013

## 2013-11-21 ENCOUNTER — Other Ambulatory Visit: Payer: Self-pay | Admitting: Internal Medicine

## 2013-11-21 ENCOUNTER — Telehealth: Payer: Self-pay | Admitting: *Deleted

## 2013-11-21 NOTE — Telephone Encounter (Signed)
Faxed patient script Notes and all documents to [facility] as requested,

## 2013-12-05 ENCOUNTER — Encounter: Payer: Self-pay | Admitting: Internal Medicine

## 2013-12-06 ENCOUNTER — Emergency Department: Payer: Self-pay | Admitting: Emergency Medicine

## 2013-12-06 LAB — PROTIME-INR
INR: 0.9
Prothrombin Time: 12.4 secs (ref 11.5–14.7)

## 2013-12-16 ENCOUNTER — Telehealth: Payer: Self-pay

## 2013-12-16 NOTE — Telephone Encounter (Signed)
Relevant patient education mailed to patient.  

## 2013-12-26 ENCOUNTER — Other Ambulatory Visit: Payer: Self-pay | Admitting: Internal Medicine

## 2014-01-04 HISTORY — PX: CAROTID ENDARTERECTOMY: SUR193

## 2014-01-05 ENCOUNTER — Other Ambulatory Visit: Payer: Self-pay | Admitting: Internal Medicine

## 2014-01-09 ENCOUNTER — Ambulatory Visit: Payer: Self-pay | Admitting: Vascular Surgery

## 2014-01-09 LAB — URINALYSIS, COMPLETE
Bacteria: NONE SEEN
Bilirubin,UR: NEGATIVE
Glucose,UR: NEGATIVE mg/dL (ref 0–75)
Ketone: NEGATIVE
NITRITE: NEGATIVE
Ph: 5 (ref 4.5–8.0)
Protein: NEGATIVE
SPECIFIC GRAVITY: 1.01 (ref 1.003–1.030)
Squamous Epithelial: 4

## 2014-01-09 LAB — CBC
HCT: 40.7 % (ref 35.0–47.0)
HGB: 13.6 g/dL (ref 12.0–16.0)
MCH: 28.8 pg (ref 26.0–34.0)
MCHC: 33.5 g/dL (ref 32.0–36.0)
MCV: 86 fL (ref 80–100)
Platelet: 279 10*3/uL (ref 150–440)
RBC: 4.74 10*6/uL (ref 3.80–5.20)
RDW: 13 % (ref 11.5–14.5)
WBC: 12.9 10*3/uL — AB (ref 3.6–11.0)

## 2014-01-09 LAB — BASIC METABOLIC PANEL
Anion Gap: 4 — ABNORMAL LOW (ref 7–16)
BUN: 24 mg/dL — ABNORMAL HIGH (ref 7–18)
CHLORIDE: 105 mmol/L (ref 98–107)
CO2: 29 mmol/L (ref 21–32)
CREATININE: 0.78 mg/dL (ref 0.60–1.30)
Calcium, Total: 9.5 mg/dL (ref 8.5–10.1)
EGFR (African American): >60
EGFR (Non-African Amer.): >60
Glucose: 144 mg/dL — ABNORMAL HIGH (ref 65–99)
OSMOLALITY: 282 (ref 275–301)
Potassium: 4.7 mmol/L (ref 3.5–5.1)
SODIUM: 138 mmol/L (ref 136–145)

## 2014-01-18 ENCOUNTER — Inpatient Hospital Stay: Payer: Self-pay | Admitting: Vascular Surgery

## 2014-01-18 LAB — APTT: ACTIVATED PTT: 27 s (ref 23.6–35.9)

## 2014-01-19 LAB — CBC WITH DIFFERENTIAL/PLATELET
Basophil #: 0.1 10*3/uL (ref 0.0–0.1)
Basophil %: 0.6 %
EOS ABS: 0.3 10*3/uL (ref 0.0–0.7)
EOS PCT: 2.6 %
HCT: 34.7 % — ABNORMAL LOW (ref 35.0–47.0)
HGB: 11.4 g/dL — AB (ref 12.0–16.0)
Lymphocyte #: 1 10*3/uL (ref 1.0–3.6)
Lymphocyte %: 7.2 %
MCH: 28.7 pg (ref 26.0–34.0)
MCHC: 32.7 g/dL (ref 32.0–36.0)
MCV: 88 fL (ref 80–100)
Monocyte #: 0.8 x10 3/mm (ref 0.2–0.9)
Monocyte %: 6.2 %
NEUTROS ABS: 11.1 10*3/uL — AB (ref 1.4–6.5)
Neutrophil %: 83.4 %
PLATELETS: 215 10*3/uL (ref 150–440)
RBC: 3.96 10*6/uL (ref 3.80–5.20)
RDW: 13.1 % (ref 11.5–14.5)
WBC: 13.4 10*3/uL — ABNORMAL HIGH (ref 3.6–11.0)

## 2014-01-19 LAB — BASIC METABOLIC PANEL
Anion Gap: 5 — ABNORMAL LOW (ref 7–16)
BUN: 9 mg/dL (ref 7–18)
CREATININE: 0.69 mg/dL (ref 0.60–1.30)
Calcium, Total: 7.1 mg/dL — ABNORMAL LOW (ref 8.5–10.1)
Chloride: 109 mmol/L — ABNORMAL HIGH (ref 98–107)
Co2: 26 mmol/L (ref 21–32)
EGFR (Non-African Amer.): >60
GLUCOSE: 192 mg/dL — AB (ref 65–99)
Osmolality: 283 (ref 275–301)
Potassium: 4 mmol/L (ref 3.5–5.1)
SODIUM: 140 mmol/L (ref 136–145)

## 2014-01-19 LAB — PROTIME-INR
INR: 1.1
Prothrombin Time: 13.9 secs (ref 11.5–14.7)

## 2014-01-19 LAB — APTT: Activated PTT: 27.6 secs (ref 23.6–35.9)

## 2014-01-20 LAB — PATHOLOGY REPORT

## 2014-01-22 ENCOUNTER — Other Ambulatory Visit: Payer: Self-pay | Admitting: Internal Medicine

## 2014-01-22 DIAGNOSIS — Z9889 Other specified postprocedural states: Secondary | ICD-10-CM | POA: Insufficient documentation

## 2014-01-27 ENCOUNTER — Telehealth: Payer: Self-pay | Admitting: *Deleted

## 2014-01-27 NOTE — Telephone Encounter (Signed)
Chart reviewed for DM bundle.  Last appt 11/18/13, due for LDL. Spoke with pt, Appt sch 02/22/14, prefers to have labs drawn same day, will come fasting

## 2014-02-05 ENCOUNTER — Other Ambulatory Visit: Payer: Self-pay | Admitting: Internal Medicine

## 2014-02-06 ENCOUNTER — Other Ambulatory Visit: Payer: Self-pay | Admitting: Internal Medicine

## 2014-02-06 NOTE — Telephone Encounter (Signed)
Last visit 11/14/13, ok refill?

## 2014-02-11 LAB — HM DIABETES EYE EXAM

## 2014-02-13 NOTE — Telephone Encounter (Signed)
Ok to refill,  printed rx  

## 2014-02-20 ENCOUNTER — Other Ambulatory Visit: Payer: Self-pay | Admitting: Internal Medicine

## 2014-02-22 ENCOUNTER — Ambulatory Visit: Payer: Medicare Other | Admitting: Internal Medicine

## 2014-02-26 ENCOUNTER — Telehealth: Payer: Self-pay | Admitting: Internal Medicine

## 2014-02-26 DIAGNOSIS — Z9889 Other specified postprocedural states: Secondary | ICD-10-CM

## 2014-03-14 ENCOUNTER — Ambulatory Visit (INDEPENDENT_AMBULATORY_CARE_PROVIDER_SITE_OTHER): Payer: Medicare Other | Admitting: Internal Medicine

## 2014-03-14 ENCOUNTER — Encounter: Payer: Self-pay | Admitting: Internal Medicine

## 2014-03-14 ENCOUNTER — Ambulatory Visit: Payer: Self-pay

## 2014-03-14 VITALS — BP 123/74 | HR 78 | Temp 98.5°F | Resp 14 | Ht 66.0 in | Wt 170.8 lb

## 2014-03-14 DIAGNOSIS — I10 Essential (primary) hypertension: Secondary | ICD-10-CM

## 2014-03-14 DIAGNOSIS — R5383 Other fatigue: Secondary | ICD-10-CM

## 2014-03-14 DIAGNOSIS — IMO0002 Reserved for concepts with insufficient information to code with codable children: Secondary | ICD-10-CM

## 2014-03-14 DIAGNOSIS — Z79899 Other long term (current) drug therapy: Secondary | ICD-10-CM

## 2014-03-14 DIAGNOSIS — Z1239 Encounter for other screening for malignant neoplasm of breast: Secondary | ICD-10-CM

## 2014-03-14 DIAGNOSIS — I798 Other disorders of arteries, arterioles and capillaries in diseases classified elsewhere: Secondary | ICD-10-CM

## 2014-03-14 DIAGNOSIS — E1159 Type 2 diabetes mellitus with other circulatory complications: Secondary | ICD-10-CM

## 2014-03-14 DIAGNOSIS — R5381 Other malaise: Secondary | ICD-10-CM

## 2014-03-14 DIAGNOSIS — Z1159 Encounter for screening for other viral diseases: Secondary | ICD-10-CM

## 2014-03-14 DIAGNOSIS — E785 Hyperlipidemia, unspecified: Secondary | ICD-10-CM

## 2014-03-14 DIAGNOSIS — Z23 Encounter for immunization: Secondary | ICD-10-CM

## 2014-03-14 DIAGNOSIS — E1151 Type 2 diabetes mellitus with diabetic peripheral angiopathy without gangrene: Secondary | ICD-10-CM

## 2014-03-14 DIAGNOSIS — E119 Type 2 diabetes mellitus without complications: Secondary | ICD-10-CM

## 2014-03-14 DIAGNOSIS — E1165 Type 2 diabetes mellitus with hyperglycemia: Secondary | ICD-10-CM

## 2014-03-14 MED ORDER — LISINOPRIL 5 MG PO TABS: ORAL_TABLET | ORAL | Status: DC

## 2014-03-14 NOTE — Assessment & Plan Note (Addendum)
Insulin dose was increased  At last visit To 36 units in the evening and 30 units in the morning for A1c s elevated at 8.0 She is up-to-date on eye exams and has ongoing vascular and podiatry follow up for neuropathy and PAD. Marland KitchenRepeat labs done today,. ACE inhibitor has been resumed.  Lab Results  Component Value Date   HGBA1C 8.0* 11/14/2013    Lab Results  Component Value Date   CREATININE 0.7 11/14/2013   Lab Results  Component Value Date   MICROALBUR 3.3* 11/14/2013

## 2014-03-14 NOTE — Assessment & Plan Note (Signed)
Well controlled on current regimen. Renal function has been stable, will resume lisinopril which was stopped for unclear reasons .  Lab Results  Component Value Date   CREATININE 0.7 11/14/2013

## 2014-03-14 NOTE — Patient Instructions (Addendum)
Dreamfield's pasta is delicious pasta and will not adversely your blood sugars!  It is available at [facility] and all other grocery stores  Add metamucil daily to your regimen to help your slow transit bowel .    Your incision hernia is very different than aneursym  and is never in danger of bursting , but can become "incarcerated"  If it becomes painful or red/purple  Call me or Dr Bary Castilla immediately  [place] ON Friday  YOU RECEIVED THE FLU SHOT,  YOU DO NOT NEED THE PNEUMONIA VACCINE YET.   Hernia A hernia occurs when an internal organ pushes out through a weak spot in the abdominal wall. Hernias most commonly occur in the groin and around the navel. Hernias often can be pushed back into place (reduced). Most hernias tend to get worse over time. Some abdominal hernias can get stuck in the opening (irreducible or incarcerated hernia) and cannot be reduced. An irreducible abdominal hernia which is tightly squeezed into the opening is at risk for impaired blood supply (strangulated hernia). A strangulated hernia is a medical emergency. Because of the risk for an irreducible or strangulated hernia, surgery may be recommended to repair a hernia. CAUSES   Heavy lifting.  Prolonged coughing.  Straining to have a bowel movement.  A cut (incision) made during an abdominal surgery. HOME CARE INSTRUCTIONS   Bed rest is not required. You may continue your normal activities.  Avoid lifting more than 10 pounds (4.5 kg) or straining.  Cough gently. If you are a smoker it is best to stop. Even the best hernia repair can break down with the continual strain of coughing. Even if you do not have your hernia repaired, a cough will continue to aggravate the problem.  Do not wear anything tight over your hernia. Do not try to keep it in with an outside bandage or truss. These can damage abdominal contents if they are trapped within the hernia sac.  Eat a normal diet.  Avoid  constipation. Straining over long periods of time will increase hernia size and encourage breakdown of repairs. If you cannot do this with diet alone, stool softeners may be used. SEEK IMMEDIATE MEDICAL CARE IF:   You have a fever.  You develop increasing abdominal pain.  You feel nauseous or vomit.  Your hernia is stuck outside the abdomen, looks discolored, feels hard, or is tender.  You have any changes in your bowel habits or in the hernia that are unusual for you.  You have increased pain or swelling around the hernia.  You cannot push the hernia back in place by applying gentle pressure while lying down. MAKE SURE YOU:   Understand these instructions.  Will watch your condition.  Will get help right away if you are not doing well or get worse. Document Released: 06/23/2005 Document Revised: 09/15/2011 Document Reviewed: 02/10/2008 [facility] Patient Information 2015 [place], [place]. This information is not intended to replace advice given to you by your health care provider. Make sure you discuss any questions you have with your health care provider.

## 2014-03-14 NOTE — Progress Notes (Signed)
Pre visit review using our clinic review tool, if applicable. No additional management support is needed unless otherwise documented below in the visit note. 

## 2014-03-14 NOTE — Progress Notes (Signed)
Patient ID: Laura Reed, female   DOB: 1961/04/10, 53 y.o.   MRN: [id number]   Patient Active Problem List   Diagnosis Date Noted  . S/P carotid endarterectomy 01/22/2014  . Arrhythmia 11/14/2013  . History of shingles 05/18/2013  . Preoperative evaluation of a medical condition to rule out surgical contraindications (TAR required) 04/06/2013  . Diabetic peripheral neuropathy associated with type 2 diabetes mellitus 04/06/2013  . Diabetic osteomyelitis 04/06/2013  . Chronic hip pain 01/16/2013  . Routine general medical examination at a health care facility 06/26/2012  . PVD (peripheral vascular disease)   . History of tobacco abuse 04/29/2011  . Uncontrolled type 2 diabetes mellitus with peripheral circulatory disorder   . Hypertension   . Hemorrhoid     Subjective:  CC:   Chief Complaint  Patient presents with  . Follow-up    3 month follow-up palpitations and Diabetes    HPI:   Laura Reed is a 53 y.o. female who presents for Follow up on type 2 FM , insulin controlled,.  She has been vacationing in [facility] with her children and grandchildren and states from [facility] that her sugars have been below  200 for the last 2 months.  120 to 170 post prandially, and usually 40 by bedtime.  She denies any lows.  She is still using 70/30 insulin bid,  Using 36 in the evening and 30 units in the morning.  She has began walking more on a daily basis.  She underwent CEA on right side by Ella Jubilee  July 15th .Her surgical incision has healed nicely and she denies any pain. She saw her new eye doctor last month and has retinopathy with plans for laser surgery soon.       Past Medical History  Diagnosis Date  . Diabetes mellitus   . Hypertension   . Hemorrhoid   . PVD (peripheral vascular disease)     Past Surgical History  Procedure Laterality Date  . Coronary artery bypass graft  2009    3 vessel  . Arterial bypass surgry  2009, 2013 x 2    right leg , done in [place]  .  Cesarean section    . Cholecystectomy  oct 2013    gallstones,  [place]       The following portions of the patient's history were reviewed and updated as appropriate: Allergies, current medications, and problem list.    Review of Systems:   Patient denies headache, fevers, malaise, unintentional weight loss, skin rash, eye pain, sinus congestion and sinus pain, sore throat, dysphagia,  hemoptysis , cough, dyspnea, wheezing, chest pain, palpitations, orthopnea, edema, abdominal pain, nausea, melena, diarrhea, constipation, flank pain, dysuria, hematuria, urinary  Frequency, nocturia, numbness, tingling, seizures,  Focal weakness, Loss of consciousness,  Tremor, insomnia, depression, anxiety, and suicidal ideation.     History   Social History  . Marital Status: Legally Separated    Spouse Name: N/A    Number of Children: N/A  . Years of Education: N/A   Occupational History  . Not on file.   Social History Main Topics  . Smoking status: Former Smoker    Types: Cigarettes    Quit date: 03/30/2011  . Smokeless tobacco: Never Used  . Alcohol Use: No  . Drug Use: No  . Sexual Activity: Not Currently   Other Topics Concern  . Not on file   Social History Narrative  . No narrative on file    Objective:  Filed Vitals:  03/14/14 1412  BP: 123/74  Pulse: 78  Temp: 98.5 F (36.9 C)  Resp: 14     General appearance: alert, cooperative and appears stated age Ears: normal TM's and external ear canals both ears Throat: lips, mucosa, and tongue normal; teeth and gums normal Neck: no adenopathy, no carotid bruit, supple, symmetrical, trachea midline and thyroid not enlarged, symmetric, no tenderness/mass/nodules Back: symmetric, no curvature. ROM normal. No CVA tenderness. Lungs: clear to auscultation bilaterally Heart: regular rate and rhythm, S1, S2 normal, no murmur, click, rub or gallop Abdomen: soft, non-tender; bowel sounds normal; no masses,  no  organomegaly Pulses: 2+ and symmetric Skin: Skin color, texture, turgor normal. No rashes or lesions Lymph nodes: Cervical, supraclavicular, and axillary nodes normal.  Assessment and Plan:  Uncontrolled type 2 diabetes mellitus with peripheral circulatory disorder Insulin dose was increased  At last visit To 36 units in the evening and 30 units in the morning for A1c s elevated at 8.0 She is up-to-date on eye exams and has ongoing vascular and podiatry follow up for neuropathy and PAD. Marland KitchenRepeat labs done today,. ACE inhibitor has been resumed.  Lab Results  Component Value Date   HGBA1C 8.0* 11/14/2013    Lab Results  Component Value Date   CREATININE 0.7 11/14/2013   Lab Results  Component Value Date   MICROALBUR 3.3* 11/14/2013      Hypertension Well controlled on current regimen. Renal function has been stable, will resume lisinopril which was stopped for unclear reasons .  Lab Results  Component Value Date   CREATININE 0.7 11/14/2013      A total of 40 minutes was spent with patient more than half of which was spent in counseling patient on the above mentioned issues , reviewing and explaining recent labs and imaging studies done, and coordination of care.   Updated Medication List Outpatient Encounter Prescriptions as of 03/14/2014  Medication Sig  . ACCU-CHEK AVIVA PLUS test strip CHECK BLOOD SUGAR TWO TO THREE TIMES A DAY.  Marland Kitchen ALPRAZolam (XANAX) 0.5 MG tablet TAKE 1 TABLET BY MOUTH THREE TIMES DAILY AS NEEDED  . aspirin 81 MG tablet Take 81 mg by mouth daily.  Marland Kitchen atorvastatin (LIPITOR) 20 MG tablet TAKE 1 TABLET BY MOUTH EVERY DAY  . B-D ULTRAFINE III SHORT PEN 31G X 8 MM MISC USE WITH PEN TO ADMINISTER INSULIN TWICE DAILY  . Biotin 1000 MCG tablet Take 1,000 mcg by mouth 3 (three) times daily.  . Cholecalciferol (VITAMIN D3) 2000 UNITS TABS Take 1 capsule by mouth daily.  . clopidogrel (PLAVIX) 75 MG tablet TAKE 1 TABLET BY MOUTH ONCE DAILY  . DIGOX 125 MCG tablet  TAKE 1 TABLET BY MOUTH EVERY DAY  . gabapentin (NEURONTIN) 600 MG tablet Take 600 mg by mouth 3 (three) times daily.    . Insulin Aspart Prot & Aspart (NOVOLOG MIX 70/30 FLEXPEN) (70-30) 100 UNIT/ML SUPN 27 units in the am and 27 units in the pm before meal  . Lancets MISC Use as directed to check sugars 4 times daily   Uncontrolled diabetes  . LEVEMIR FLEXTOUCH 100 UNIT/ML Pen INJECT 30 UNITS UNDER THE SKIN EVERY NIGHT AT BEDTIME.  Marland Kitchen NOVOLOG MIX 70/30 FLEXPEN (70-30) 100 UNIT/ML Pen INJECT 27 UNITS UNDER THE SKIN TWICE DAILY  . vitamin C (ASCORBIC ACID) 500 MG tablet Take 1,000 mg by mouth 2 (two) times daily.  . [DISCONTINUED] doxycycline (ADOXA) 100 MG tablet Take 100 mg by mouth 2 (two) times daily.  . [DISCONTINUED]  Pyridoxine HCl (VITAMIN B-6) 500 MG tablet Take 500 mg by mouth daily.  . carvedilol (COREG) 6.25 MG tablet TAKE 1 TABLET BY MOUTH TWICE DAILY WITH MEALS  . lisinopril (PRINIVIL,ZESTRIL) 5 MG tablet TAKE 1 TABLET BY MOUTH EVERY DAY  . [DISCONTINUED] lisinopril (PRINIVIL,ZESTRIL) 5 MG tablet TAKE 1 TABLET BY MOUTH EVERY DAY

## 2014-03-17 ENCOUNTER — Other Ambulatory Visit (INDEPENDENT_AMBULATORY_CARE_PROVIDER_SITE_OTHER): Payer: Medicare Other

## 2014-03-17 DIAGNOSIS — Z79899 Other long term (current) drug therapy: Secondary | ICD-10-CM

## 2014-03-17 DIAGNOSIS — E785 Hyperlipidemia, unspecified: Secondary | ICD-10-CM

## 2014-03-17 DIAGNOSIS — Z1239 Encounter for other screening for malignant neoplasm of breast: Secondary | ICD-10-CM

## 2014-03-17 DIAGNOSIS — R5381 Other malaise: Secondary | ICD-10-CM

## 2014-03-17 DIAGNOSIS — R5383 Other fatigue: Secondary | ICD-10-CM

## 2014-03-17 DIAGNOSIS — E119 Type 2 diabetes mellitus without complications: Secondary | ICD-10-CM

## 2014-03-17 LAB — CBC WITH DIFFERENTIAL/PLATELET
BASOS PCT: 0.6 % (ref 0.0–3.0)
Basophils Absolute: 0.1 10*3/uL (ref 0.0–0.1)
Eosinophils Absolute: 0.2 10*3/uL (ref 0.0–0.7)
Eosinophils Relative: 2.1 % (ref 0.0–5.0)
HCT: 38.9 % (ref 36.0–46.0)
Hemoglobin: 13.1 g/dL (ref 12.0–15.0)
Lymphocytes Relative: 15.7 % (ref 12.0–46.0)
Lymphs Abs: 1.7 10*3/uL (ref 0.7–4.0)
MCHC: 33.7 g/dL (ref 30.0–36.0)
MCV: 85 fl (ref 78.0–100.0)
MONO ABS: 0.8 10*3/uL (ref 0.1–1.0)
Monocytes Relative: 7.1 % (ref 3.0–12.0)
NEUTROS PCT: 74.5 % (ref 43.0–77.0)
Neutro Abs: 8.2 10*3/uL — ABNORMAL HIGH (ref 1.4–7.7)
Platelets: 286 10*3/uL (ref 150.0–400.0)
RBC: 4.57 Mil/uL (ref 3.87–5.11)
RDW: 13.7 % (ref 11.5–15.5)
WBC: 10.9 10*3/uL — ABNORMAL HIGH (ref 4.0–10.5)

## 2014-03-17 LAB — LIPID PANEL
CHOLESTEROL: 130 mg/dL (ref 0–200)
HDL: 36.1 mg/dL — ABNORMAL LOW (ref >39.00)
LDL CALC: 66 mg/dL (ref 0–99)
NonHDL: 93.9
TRIGLYCERIDES: 142 mg/dL (ref 0.0–149.0)
Total CHOL/HDL Ratio: 4
VLDL: 28.4 mg/dL (ref 0.0–40.0)

## 2014-03-17 LAB — COMPREHENSIVE METABOLIC PANEL
ALBUMIN: 3.5 g/dL (ref 3.5–5.2)
ALT: 14 U/L (ref 0–35)
AST: 16 U/L (ref 0–37)
Alkaline Phosphatase: 77 U/L (ref 39–117)
BUN: 28 mg/dL — AB (ref 6–23)
CHLORIDE: 103 meq/L (ref 96–112)
CO2: 27 mEq/L (ref 19–32)
Calcium: 8.9 mg/dL (ref 8.4–10.5)
Creatinine, Ser: 0.7 mg/dL (ref 0.4–1.2)
GFR: 98.64 mL/min (ref >60.00)
Glucose, Bld: 110 mg/dL — ABNORMAL HIGH (ref 70–99)
POTASSIUM: 4.7 meq/L (ref 3.5–5.1)
SODIUM: 136 meq/L (ref 135–145)
TOTAL PROTEIN: 6.6 g/dL (ref 6.0–8.3)
Total Bilirubin: 0.3 mg/dL (ref 0.2–1.2)

## 2014-03-17 LAB — TSH: TSH: 1.03 u[IU]/mL (ref 0.35–4.50)

## 2014-03-17 LAB — HEMOGLOBIN A1C: Hgb A1c MFr Bld: 8.9 % — ABNORMAL HIGH (ref 4.6–6.5)

## 2014-03-30 ENCOUNTER — Telehealth: Payer: Self-pay

## 2014-03-30 ENCOUNTER — Other Ambulatory Visit: Payer: Self-pay | Admitting: Internal Medicine

## 2014-03-30 ENCOUNTER — Encounter: Payer: Self-pay | Admitting: Internal Medicine

## 2014-03-30 ENCOUNTER — Ambulatory Visit (INDEPENDENT_AMBULATORY_CARE_PROVIDER_SITE_OTHER): Payer: Medicare Other | Admitting: Internal Medicine

## 2014-03-30 VITALS — BP 148/70 | HR 79 | Temp 98.0°F | Resp 16 | Ht 66.0 in | Wt 168.5 lb

## 2014-03-30 DIAGNOSIS — E1149 Type 2 diabetes mellitus with other diabetic neurological complication: Secondary | ICD-10-CM

## 2014-03-30 DIAGNOSIS — Z1231 Encounter for screening mammogram for malignant neoplasm of breast: Secondary | ICD-10-CM

## 2014-03-30 DIAGNOSIS — E1165 Type 2 diabetes mellitus with hyperglycemia: Secondary | ICD-10-CM

## 2014-03-30 DIAGNOSIS — E1151 Type 2 diabetes mellitus with diabetic peripheral angiopathy without gangrene: Secondary | ICD-10-CM

## 2014-03-30 DIAGNOSIS — E1159 Type 2 diabetes mellitus with other circulatory complications: Secondary | ICD-10-CM

## 2014-03-30 DIAGNOSIS — E1142 Type 2 diabetes mellitus with diabetic polyneuropathy: Secondary | ICD-10-CM

## 2014-03-30 DIAGNOSIS — I798 Other disorders of arteries, arterioles and capillaries in diseases classified elsewhere: Secondary | ICD-10-CM

## 2014-03-30 DIAGNOSIS — IMO0002 Reserved for concepts with insufficient information to code with codable children: Secondary | ICD-10-CM

## 2014-03-30 LAB — HM DIABETES FOOT EXAM: HM Diabetic Foot Exam: ABNORMAL

## 2014-03-30 MED ORDER — GABAPENTIN 600 MG PO TABS: 600.0000 mg | ORAL_TABLET | Freq: Three times a day (TID) | ORAL | Status: DC

## 2014-03-30 MED ORDER — GLUCERNA HUNGER SMART SHAKE PO LIQD: ORAL | Status: DC

## 2014-03-30 MED ORDER — ACCU-CHEK AVIVA PLUS W/DEVICE KIT: PACK | Status: DC

## 2014-03-30 MED ORDER — INSULIN PEN NEEDLE 31G X 8 MM MISC: Status: DC

## 2014-03-30 MED ORDER — INSULIN ASPART PROT & ASPART (70-30 MIX) 100 UNIT/ML PEN: PEN_INJECTOR | SUBCUTANEOUS | Status: DC

## 2014-03-30 MED ORDER — GLUCOSE BLOOD VI STRP: ORAL_STRIP | Status: DC

## 2014-03-30 NOTE — Progress Notes (Signed)
Patient ID: Laura Reed, female   DOB: 06-29-1961, 53 y.o.   MRN: [id number]  Patient Active Problem List   Diagnosis Date Noted  . S/P carotid endarterectomy 01/22/2014  . Arrhythmia 11/14/2013  . History of shingles 05/18/2013  . Preoperative evaluation of a medical condition to rule out surgical contraindications (TAR required) 04/06/2013  . Diabetic peripheral neuropathy associated with type 2 diabetes mellitus 04/06/2013  . Diabetic osteomyelitis 04/06/2013  . Chronic hip pain 01/16/2013  . Routine general medical examination at a health care facility 06/26/2012  . PVD (peripheral vascular disease)   . History of tobacco abuse 04/29/2011  . Uncontrolled type 2 diabetes mellitus with peripheral circulatory disorder   . Hypertension   . Hemorrhoid     Subjective:  CC:   Chief Complaint  Patient presents with  . Follow-up  . Diabetes    HPI:   Laura Reed is a 53 y.o. female who presents for Follow up on uncontrolled DM 2.  At last visit patient's blood sugars were reasonably well controlled but her A1c was > 8. .  Faulty glucometer was the cause.  With her new glucometer she has taken two weeks of readings and has brought them with her today . She has altered her diet and sugars are now getting in line.  Past Medical History  Diagnosis Date  . Diabetes mellitus   . Hypertension   . Hemorrhoid   . PVD (peripheral vascular disease)     Past Surgical History  Procedure Laterality Date  . Coronary artery bypass graft  2009    3 vessel  . Arterial bypass surgry  2009, 2013 x 2    right leg , done in [place]  . Cesarean section    . Cholecystectomy  oct 2013    gallstones,  [place]       The following portions of the patient's history were reviewed and updated as appropriate: Allergies, current medications, and problem list.    Review of Systems:   Patient denies headache, fevers, malaise, unintentional weight loss, skin rash, eye pain, sinus congestion  and sinus pain, sore throat, dysphagia,  hemoptysis , cough, dyspnea, wheezing, chest pain, palpitations, orthopnea, edema, abdominal pain, nausea, melena, diarrhea, constipation, flank pain, dysuria, hematuria, urinary  Frequency, nocturia, numbness, tingling, seizures,  Focal weakness, Loss of consciousness,  Tremor, insomnia, depression, anxiety, and suicidal ideation.     History   Social History  . Marital Status: Legally Separated    Spouse Name: N/A    Number of Children: N/A  . Years of Education: N/A   Occupational History  . Not on file.   Social History Main Topics  . Smoking status: Former Smoker    Types: Cigarettes    Quit date: 03/30/2011  . Smokeless tobacco: Never Used  . Alcohol Use: No  . Drug Use: No  . Sexual Activity: Not Currently   Other Topics Concern  . Not on file   Social History Narrative  . No narrative on file    Objective:  Filed Vitals:   03/30/14 1018  BP: 148/70  Pulse: 79  Temp: 98 F (36.7 C)  Resp: 16     General appearance: alert, cooperative and appears stated age Ears: normal TM's and external ear canals both ears Throat: lips, mucosa, and tongue normal; teeth and gums normal Neck: no adenopathy, no carotid bruit, supple, symmetrical, trachea midline and thyroid not enlarged, symmetric, no tenderness/mass/nodules Back: symmetric, no curvature. ROM normal. No CVA  tenderness. Lungs: clear to auscultation bilaterally Heart: regular rate and rhythm, S1, S2 normal, no murmur, click, rub or gallop Abdomen: soft, non-tender; bowel sounds normal; no masses,  no organomegaly Pulses: 2+ and symmetric Skin: Skin color, texture, turgor normal. No rashes or lesions Lymph nodes: Cervical, supraclavicular, and axillary nodes normal.  Assessment and Plan:  Diabetic peripheral neuropathy associated with type 2 diabetes mellitus Insulin dose was increased  At last visit To 36 units in the evening and 30 units in the morning for A1c s  elevated at 8.0 She is up-to-date on eye exams and has ongoing vascular and podiatry follow up for neuropathy and PAD. Marland KitchenRepeat labs done today,. ACE inhibitor has been resumed.  Lab Results  Component Value Date   HGBA1C 8.9* 03/17/2014    Lab Results  Component Value Date   CREATININE 0.7 03/17/2014   Lab Results  Component Value Date   MICROALBUR 3.3* 11/14/2013         Updated Medication List Outpatient Encounter Prescriptions as of 03/30/2014  Medication Sig  . ALPRAZolam (XANAX) 0.5 MG tablet TAKE 1 TABLET BY MOUTH THREE TIMES DAILY AS NEEDED  . aspirin 81 MG tablet Take 81 mg by mouth daily.  Marland Kitchen atorvastatin (LIPITOR) 20 MG tablet TAKE 1 TABLET BY MOUTH EVERY DAY  . Biotin 1000 MCG tablet Take 1,000 mcg by mouth 3 (three) times daily.  . Cholecalciferol (VITAMIN D3) 2000 UNITS TABS Take 1 capsule by mouth daily.  . clopidogrel (PLAVIX) 75 MG tablet TAKE 1 TABLET BY MOUTH ONCE DAILY  . DIGOX 125 MCG tablet TAKE 1 TABLET BY MOUTH EVERY DAY  . gabapentin (NEURONTIN) 600 MG tablet Take 1 tablet (600 mg total) by mouth 3 (three) times daily.  Marland Kitchen glucose blood (ACCU-CHEK AVIVA PLUS) test strip CHECK BLOOD SUGAR TWO TO THREE TIMES A DAY.  Marland Kitchen Insulin Aspart Prot & Aspart (NOVOLOG MIX 70/30 FLEXPEN) (70-30) 100 UNIT/ML Pen Inject 36 units in the AM and 36 units in the afternoon  . Insulin Aspart Prot & Aspart (NOVOLOG MIX 70/30 FLEXPEN) (70-30) 100 UNIT/ML Pen 27 units in the am and 27 units in the pm before meal  . Insulin Pen Needle (B-D ULTRAFINE III SHORT PEN) 31G X 8 MM MISC USE WITH PEN TO ADMINISTER INSULIN TWICE DAILY  . LEVEMIR FLEXTOUCH 100 UNIT/ML Pen INJECT 30 UNITS UNDER THE SKIN EVERY NIGHT AT BEDTIME.  . vitamin C (ASCORBIC ACID) 500 MG tablet Take 1,000 mg by mouth 2 (two) times daily.  . [DISCONTINUED] ACCU-CHEK AVIVA PLUS test strip CHECK BLOOD SUGAR TWO TO THREE TIMES A DAY.  . [DISCONTINUED] B-D ULTRAFINE III SHORT PEN 31G X 8 MM MISC USE WITH PEN TO ADMINISTER INSULIN  TWICE DAILY  . [DISCONTINUED] gabapentin (NEURONTIN) 600 MG tablet Take 600 mg by mouth 3 (three) times daily.    . [DISCONTINUED] Insulin Aspart Prot & Aspart (NOVOLOG MIX 70/30 FLEXPEN) (70-30) 100 UNIT/ML SUPN 27 units in the am and 27 units in the pm before meal  . [DISCONTINUED] Lancets MISC Use as directed to check sugars 4 times daily   Uncontrolled diabetes  . [DISCONTINUED] NOVOLOG MIX 70/30 FLEXPEN (70-30) 100 UNIT/ML Pen INJECT 27 UNITS UNDER THE SKIN TWICE DAILY  . Blood Glucose Monitoring Suppl (ACCU-CHEK AVIVA PLUS) W/DEVICE KIT Check blood sugar 1-3 times daily. DX 250.00  . carvedilol (COREG) 6.25 MG tablet TAKE 1 TABLET BY MOUTH TWICE DAILY WITH MEALS  . lisinopril (PRINIVIL,ZESTRIL) 5 MG tablet TAKE 1 TABLET BY MOUTH  EVERY DAY  . Nutritional Supplements ([place]) LIQD One shake daily for breakfast,,  Chocolate variety     No orders of the defined types were placed in this encounter.    No Follow-up on file.

## 2014-03-30 NOTE — Telephone Encounter (Signed)
The patient has a 3D mammogram scheduled at [facility] (phone- [phone]) Wednesday October 14th at 10:30am  Pt is aware of apt details.

## 2014-03-30 NOTE — Patient Instructions (Addendum)
Continue 30 units  In the am and 36 units in the evening of your 70/30 insulin,  Your sugars are much   Script for Glucerna

## 2014-03-30 NOTE — Progress Notes (Signed)
Pre-visit discussion using our clinic review tool. No additional management support is needed unless otherwise documented below in the visit note.  

## 2014-04-01 ENCOUNTER — Other Ambulatory Visit: Payer: Self-pay | Admitting: Internal Medicine

## 2014-04-01 NOTE — Assessment & Plan Note (Signed)
Insulin dose was increased  At last visit To 36 units in the evening and 30 units in the morning for A1c s elevated at 8.0 She is up-to-date on eye exams and has ongoing vascular and podiatry follow up for neuropathy and PAD. Marland KitchenRepeat labs done today,. ACE inhibitor has been resumed.  Lab Results  Component Value Date   HGBA1C 8.9* 03/17/2014    Lab Results  Component Value Date   CREATININE 0.7 03/17/2014   Lab Results  Component Value Date   MICROALBUR 3.3* 11/14/2013

## 2014-04-03 ENCOUNTER — Telehealth: Payer: Self-pay | Admitting: Internal Medicine

## 2014-04-03 MED ORDER — ACCU-CHEK AVIVA VI SOLN: Status: DC

## 2014-04-04 ENCOUNTER — Encounter: Payer: Self-pay | Admitting: Internal Medicine

## 2014-04-04 DIAGNOSIS — E108 Type 1 diabetes mellitus with unspecified complications: Secondary | ICD-10-CM | POA: Insufficient documentation

## 2014-04-04 NOTE — Telephone Encounter (Signed)
Refill sent for testing solution for CBG Glucometer.

## 2014-04-05 ENCOUNTER — Other Ambulatory Visit: Payer: Self-pay | Admitting: Internal Medicine

## 2014-04-19 ENCOUNTER — Ambulatory Visit
Admission: RE | Admit: 2014-04-19 | Discharge: 2014-04-19 | Disposition: A | Discharge status: Home / Self Care | Payer: 59 | Source: Ambulatory Visit | Attending: Internal Medicine | Admitting: Internal Medicine

## 2014-04-19 DIAGNOSIS — Z1231 Encounter for screening mammogram for malignant neoplasm of breast: Secondary | ICD-10-CM

## 2014-05-17 ENCOUNTER — Other Ambulatory Visit: Payer: Self-pay | Admitting: Internal Medicine

## 2014-05-24 ENCOUNTER — Telehealth: Payer: Self-pay | Admitting: Internal Medicine

## 2014-05-24 NOTE — Telephone Encounter (Signed)
Patient called and stated she is testing Blood sugars 5 to 7 times daily script stated 2 - 3 times daily called patient and advised her you

## 2014-06-15 ENCOUNTER — Ambulatory Visit (INDEPENDENT_AMBULATORY_CARE_PROVIDER_SITE_OTHER): Payer: 59 | Admitting: Internal Medicine

## 2014-06-15 ENCOUNTER — Encounter: Payer: Self-pay | Admitting: Internal Medicine

## 2014-06-15 VITALS — BP 140/68 | HR 76 | Temp 98.1°F | Resp 14 | Ht 66.0 in | Wt 174.5 lb

## 2014-06-15 DIAGNOSIS — E1165 Type 2 diabetes mellitus with hyperglycemia: Principal | ICD-10-CM

## 2014-06-15 DIAGNOSIS — E1151 Type 2 diabetes mellitus with diabetic peripheral angiopathy without gangrene: Secondary | ICD-10-CM

## 2014-06-15 DIAGNOSIS — I739 Peripheral vascular disease, unspecified: Secondary | ICD-10-CM

## 2014-06-15 DIAGNOSIS — E1159 Type 2 diabetes mellitus with other circulatory complications: Secondary | ICD-10-CM

## 2014-06-15 DIAGNOSIS — Z23 Encounter for immunization: Secondary | ICD-10-CM

## 2014-06-15 DIAGNOSIS — IMO0002 Reserved for concepts with insufficient information to code with codable children: Secondary | ICD-10-CM

## 2014-06-15 DIAGNOSIS — E785 Hyperlipidemia, unspecified: Secondary | ICD-10-CM

## 2014-06-15 DIAGNOSIS — I10 Essential (primary) hypertension: Secondary | ICD-10-CM

## 2014-06-15 DIAGNOSIS — E1169 Type 2 diabetes mellitus with other specified complication: Secondary | ICD-10-CM

## 2014-06-15 NOTE — Progress Notes (Signed)
Patient ID: Laura Reed, female   DOB: February 03, 1961, 53 y.o.   MRN: [id number]   Patient Active Problem List   Diagnosis Date Noted  . Hyperlipidemia associated with type 2 diabetes mellitus 06/17/2014  . S/P carotid endarterectomy 01/22/2014  . Arrhythmia 11/14/2013  . History of shingles 05/18/2013  . Preoperative evaluation of a medical condition to rule out surgical contraindications (TAR required) 04/06/2013  . Diabetic peripheral neuropathy associated with type 2 diabetes mellitus 04/06/2013  . Diabetic osteomyelitis 04/06/2013  . Chronic hip pain 01/16/2013  . Routine general medical examination at a health care facility 06/26/2012  . PVD (peripheral vascular disease)   . History of tobacco abuse 04/29/2011  . Uncontrolled type 2 diabetes mellitus with peripheral circulatory disorder   . Hypertension   . Hemorrhoid     Subjective:  CC:   Chief Complaint  Patient presents with  . Follow-up  . Diabetes    HPI:   Laura Reed is a 53 y.o. female who presents for  3 month follow up on uncontrolled DM type 2, insulin requiring, with PAD. Her last A1c was 8.9 in September.  Her blood sugars have been better controlled on current regimen of  70/30 insulin twice daily current dose 30 im th am and 36 i the evening  And 30 units of levemir .  She has had Occasional lows to  60 occurring 3 times weekly  Usually after lunch.  She is now walking daily ,  Feet are well perfused s /p multiple amputations.     Past Medical History  Diagnosis Date  . Diabetes mellitus   . Hypertension   . Hemorrhoid   . PVD (peripheral vascular disease)     Past Surgical History  Procedure Laterality Date  . Coronary artery bypass graft  2009    3 vessel  . Arterial bypass surgry  2009, 2013 x 2    right leg , done in [place]  . Cesarean section    . Cholecystectomy  oct 2013    gallstones,  [place]       The following portions of the patient's history were reviewed and updated  as appropriate: Allergies, current medications, and problem list.    Review of Systems:   Patient denies headache, fevers, malaise, unintentional weight loss, skin rash, eye pain, sinus congestion and sinus pain, sore throat, dysphagia,  hemoptysis , cough, dyspnea, wheezing, chest pain, palpitations, orthopnea, edema, abdominal pain, nausea, melena, diarrhea, constipation, flank pain, dysuria, hematuria, urinary  Frequency, nocturia, numbness, tingling, seizures,  Focal weakness, Loss of consciousness,  Tremor, insomnia, depression, anxiety, and suicidal ideation.     History   Social History  . Marital Status: Legally Separated    Spouse Name: N/A    Number of Children: N/A  . Years of Education: N/A   Occupational History  . Not on file.   Social History Main Topics  . Smoking status: Former Smoker    Types: Cigarettes    Quit date: 03/30/2011  . Smokeless tobacco: Never Used  . Alcohol Use: No  . Drug Use: No  . Sexual Activity: Not Currently   Other Topics Concern  . Not on file   Social History Narrative    Objective:  Filed Vitals:   06/15/14 1038  BP: 140/68  Pulse: 76  Temp: 98.1 F (36.7 C)  Resp: 14     General appearance: alert, cooperative and appears stated age Ears: normal TM's and external ear canals both ears  Throat: lips, mucosa, and tongue normal; teeth and gums normal Neck: no adenopathy, no carotid bruit, supple, symmetrical, trachea midline and thyroid not enlarged, symmetric, no tenderness/mass/nodules Back: symmetric, no curvature. ROM normal. No CVA tenderness. Lungs: clear to auscultation bilaterally Heart: regular rate and rhythm, S1, S2 normal, no murmur, click, rub or gallop Abdomen: soft, non-tender; bowel sounds normal; no masses,  no organomegaly Pulses: 2+ and symmetric Skin: Skin color, texture, turgor normal. No rashes or lesions Lymph nodes: Cervical, supraclavicular, and axillary nodes normal.  Assessment and  Plan:  Uncontrolled type 2 diabetes mellitus with peripheral circulatory disorder Diabetes control is now excellent morning dose of insulin reduced to 22 to eliminate hypoglycemic events.. Patient is up-to-date on eye exams and foot exam is normal today. Patient is due for urine microalbumin to creatinine ratio at next visit. Patient is tolerating statin therapy for CAD risk reduction and on ACE/ARB for reduction in proteinuria.    Lab Results  Component Value Date   HGBA1C 6.4 06/16/2014   Lab Results  Component Value Date   MICROALBUR 4.3* 06/16/2014     Hypertension Well controlled on current regimen. Renal function stable, no changes today.  Lab Results  Component Value Date   CREATININE 0.6 06/16/2014   Lab Results  Component Value Date   NA 135 06/16/2014   K 4.7 06/16/2014   CL 106 06/16/2014   CO2 26 06/16/2014     PVD (peripheral vascular disease) S/p multiple amputations of toes due to progressive PAD, and right CEA.  Her left carotid artery stenosis has been noted as well (70%) and repeat utrasound  Is scheduled for January . Continue asa and statin  Hyperlipidemia associated with type 2 diabetes mellitus Well controlled on current statin therapy.   Liver enzymes are normal , no changes today.  Lab Results  Component Value Date   CHOL 120 06/16/2014   HDL 36.00* 06/16/2014   LDLCALC 58 06/16/2014   LDLDIRECT 68.2 05/18/2013   TRIG 132.0 06/16/2014   CHOLHDL 3 06/16/2014   Lab Results  Component Value Date   ALT 18 06/16/2014   AST 15 06/16/2014   ALKPHOS 80 06/16/2014   BILITOT 0.5 06/16/2014      Updated Medication List Outpatient Encounter Prescriptions as of 06/15/2014  Medication Sig  . ALPRAZolam (XANAX) 0.5 MG tablet TAKE 1 TABLET BY MOUTH THREE TIMES DAILY AS NEEDED  . aspirin 81 MG tablet Take 81 mg by mouth daily.  Marland Kitchen atorvastatin (LIPITOR) 20 MG tablet TAKE 1 TABLET BY MOUTH EVERY DAY  . Biotin 1000 MCG tablet Take 1,000 mcg by mouth 3  (three) times daily.  . Blood Glucose Calibration (ACCU-CHEK AVIVA) SOLN Used to calibrate for CBG testing 1-3 times daily DX: 250.00  . Blood Glucose Monitoring Suppl (ACCU-CHEK AVIVA PLUS) W/DEVICE KIT Check blood sugar 1-3 times daily. DX 250.00  . Cholecalciferol (VITAMIN D3) 2000 UNITS TABS Take 1 capsule by mouth daily.  . clopidogrel (PLAVIX) 75 MG tablet TAKE 1 TABLET BY MOUTH ONCE DAILY  . DIGOX 125 MCG tablet TAKE 1 TABLET BY MOUTH EVERY DAY  . gabapentin (NEURONTIN) 600 MG tablet Take 1 tablet (600 mg total) by mouth 3 (three) times daily.  Marland Kitchen glucose blood (ACCU-CHEK AVIVA PLUS) test strip CHECK BLOOD SUGAR TWO TO THREE TIMES A DAY.  Marland Kitchen Insulin Aspart Prot & Aspart (NOVOLOG MIX 70/30 FLEXPEN) (70-30) 100 UNIT/ML Pen Inject 36 units in the AM and 36 units in the afternoon  . Insulin Aspart Prot &  Aspart (NOVOLOG MIX 70/30 FLEXPEN) (70-30) 100 UNIT/ML Pen 27 units in the am and 27 units in the pm before meal  . Insulin Pen Needle (B-D ULTRAFINE III SHORT PEN) 31G X 8 MM MISC USE WITH PEN TO ADMINISTER INSULIN TWICE DAILY  . LEVEMIR FLEXTOUCH 100 UNIT/ML Pen INJECT 30 UNITS UNDER THE SKIN EVERY NIGHT AT BEDTIME  . Nutritional Supplements ([place]) LIQD One shake daily for breakfast,,  Chocolate variety  . TRUEPLUS LANCETS 28G MISC CHECK BLOOD SUGAR FOUR TIMES DAILY  . vitamin C (ASCORBIC ACID) 500 MG tablet Take 1,000 mg by mouth 2 (two) times daily.  . carvedilol (COREG) 6.25 MG tablet TAKE 1 TABLET BY MOUTH TWICE DAILY WITH MEALS (Patient not taking: Reported on 06/15/2014)  . lisinopril (PRINIVIL,ZESTRIL) 5 MG tablet TAKE 1 TABLET BY MOUTH EVERY DAY (Patient not taking: Reported on 06/15/2014)     Orders Placed This Encounter  Procedures  . Pneumococcal conjugate vaccine 13-valent  . Comprehensive metabolic panel  . Hemoglobin A1c  . Lipid panel  . Microalbumin / creatinine urine ratio    Return in about 3 months (around 09/14/2014) for follow up  diabetes.

## 2014-06-15 NOTE — Progress Notes (Signed)
Pre-visit discussion using our clinic review tool. No additional management support is needed unless otherwise documented below in the visit note.  

## 2014-06-16 ENCOUNTER — Other Ambulatory Visit (INDEPENDENT_AMBULATORY_CARE_PROVIDER_SITE_OTHER): Payer: 59

## 2014-06-16 DIAGNOSIS — E1165 Type 2 diabetes mellitus with hyperglycemia: Principal | ICD-10-CM

## 2014-06-16 DIAGNOSIS — E1151 Type 2 diabetes mellitus with diabetic peripheral angiopathy without gangrene: Secondary | ICD-10-CM

## 2014-06-16 DIAGNOSIS — E1159 Type 2 diabetes mellitus with other circulatory complications: Secondary | ICD-10-CM

## 2014-06-16 DIAGNOSIS — IMO0002 Reserved for concepts with insufficient information to code with codable children: Secondary | ICD-10-CM

## 2014-06-16 LAB — COMPREHENSIVE METABOLIC PANEL
ALT: 18 U/L (ref 0–35)
AST: 15 U/L (ref 0–37)
Albumin: 3.4 g/dL — ABNORMAL LOW (ref 3.5–5.2)
Alkaline Phosphatase: 80 U/L (ref 39–117)
BUN: 28 mg/dL — AB (ref 6–23)
CALCIUM: 9 mg/dL (ref 8.4–10.5)
CHLORIDE: 106 meq/L (ref 96–112)
CO2: 26 mEq/L (ref 19–32)
Creatinine, Ser: 0.6 mg/dL (ref 0.4–1.2)
GFR: 110.01 mL/min (ref >60.00)
Glucose, Bld: 203 mg/dL — ABNORMAL HIGH (ref 70–99)
Potassium: 4.7 mEq/L (ref 3.5–5.1)
Sodium: 135 mEq/L (ref 135–145)
Total Bilirubin: 0.5 mg/dL (ref 0.2–1.2)
Total Protein: 6.2 g/dL (ref 6.0–8.3)

## 2014-06-16 LAB — HEMOGLOBIN A1C: Hgb A1c MFr Bld: 6.4 % (ref 4.6–6.5)

## 2014-06-16 LAB — LIPID PANEL
CHOLESTEROL: 120 mg/dL (ref 0–200)
HDL: 36 mg/dL — ABNORMAL LOW (ref >39.00)
LDL CALC: 58 mg/dL (ref 0–99)
NonHDL: 84
TRIGLYCERIDES: 132 mg/dL (ref 0.0–149.0)
Total CHOL/HDL Ratio: 3
VLDL: 26.4 mg/dL (ref 0.0–40.0)

## 2014-06-16 LAB — MICROALBUMIN / CREATININE URINE RATIO
CREATININE, U: 77.1 mg/dL
MICROALB/CREAT RATIO: 5.6 mg/g (ref 0.0–30.0)
Microalb, Ur: 4.3 mg/dL — ABNORMAL HIGH (ref 0.0–1.9)

## 2014-06-16 NOTE — Addendum Note (Signed)
Addended by: Johnsie Cancel on: 06/16/2014 09:52 AM   Modules accepted: Orders

## 2014-06-17 DIAGNOSIS — E785 Hyperlipidemia, unspecified: Secondary | ICD-10-CM

## 2014-06-17 DIAGNOSIS — E1169 Type 2 diabetes mellitus with other specified complication: Secondary | ICD-10-CM | POA: Insufficient documentation

## 2014-06-17 NOTE — Assessment & Plan Note (Addendum)
S/p multiple amputations of toes due to progressive PAD, and right CEA.  Her left carotid artery stenosis has been noted as well (70%) and repeat utrasound  Is scheduled for January . Continue asa and statin

## 2014-06-17 NOTE — Assessment & Plan Note (Signed)
Well controlled on current regimen. Renal function stable, no changes today.  Lab Results  Component Value Date   CREATININE 0.6 06/16/2014   Lab Results  Component Value Date   NA 135 06/16/2014   K 4.7 06/16/2014   CL 106 06/16/2014   CO2 26 06/16/2014

## 2014-06-17 NOTE — Assessment & Plan Note (Signed)
Well controlled on current statin therapy.   Liver enzymes are normal , no changes today.  Lab Results  Component Value Date   CHOL 120 06/16/2014   HDL 36.00* 06/16/2014   LDLCALC 58 06/16/2014   LDLDIRECT 68.2 05/18/2013   TRIG 132.0 06/16/2014   CHOLHDL 3 06/16/2014   Lab Results  Component Value Date   ALT 18 06/16/2014   AST 15 06/16/2014   ALKPHOS 80 06/16/2014   BILITOT 0.5 06/16/2014

## 2014-06-17 NOTE — Assessment & Plan Note (Signed)
Diabetes control is now excellent morning dose of insulin reduced to 22 to eliminate hypoglycemic events.. Patient is up-to-date on eye exams and foot exam is normal today. Patient is due for urine microalbumin to creatinine ratio at next visit. Patient is tolerating statin therapy for CAD risk reduction and on ACE/ARB for reduction in proteinuria.    Lab Results  Component Value Date   HGBA1C 6.4 06/16/2014   Lab Results  Component Value Date   MICROALBUR 4.3* 06/16/2014

## 2014-06-18 ENCOUNTER — Encounter: Payer: Self-pay | Admitting: Internal Medicine

## 2014-06-23 LAB — HM DIABETES EYE EXAM

## 2014-07-06 ENCOUNTER — Telehealth: Payer: Self-pay

## 2014-07-06 DIAGNOSIS — K432 Incisional hernia without obstruction or gangrene: Secondary | ICD-10-CM

## 2014-07-06 NOTE — Telephone Encounter (Signed)
Spoke to pt about where the eye exam was done. Pt stated that the eye exam done at [facility].  Called eye center.   Pt wanted to know when she could expect to hear from someone regarding the Gen Surg referral to remove the hernia.

## 2014-07-11 NOTE — Addendum Note (Signed)
Addended by: [name] on: 07/11/2014 07:11 AM   Modules accepted: Orders

## 2014-07-12 ENCOUNTER — Encounter: Payer: Self-pay | Admitting: *Deleted

## 2014-07-19 ENCOUNTER — Encounter: Payer: Self-pay | Admitting: *Deleted

## 2014-07-19 DIAGNOSIS — E1140 Type 2 diabetes mellitus with diabetic neuropathy, unspecified: Secondary | ICD-10-CM | POA: Diagnosis not present

## 2014-07-19 DIAGNOSIS — L851 Acquired keratosis [keratoderma] palmaris et plantaris: Secondary | ICD-10-CM | POA: Diagnosis not present

## 2014-07-19 DIAGNOSIS — B351 Tinea unguium: Secondary | ICD-10-CM | POA: Diagnosis not present

## 2014-07-27 DIAGNOSIS — I6529 Occlusion and stenosis of unspecified carotid artery: Secondary | ICD-10-CM | POA: Diagnosis not present

## 2014-07-27 DIAGNOSIS — I2510 Atherosclerotic heart disease of native coronary artery without angina pectoris: Secondary | ICD-10-CM | POA: Diagnosis not present

## 2014-07-27 DIAGNOSIS — I70213 Atherosclerosis of native arteries of extremities with intermittent claudication, bilateral legs: Secondary | ICD-10-CM | POA: Diagnosis not present

## 2014-07-27 DIAGNOSIS — E785 Hyperlipidemia, unspecified: Secondary | ICD-10-CM | POA: Diagnosis not present

## 2014-07-31 ENCOUNTER — Encounter: Payer: Self-pay | Admitting: Internal Medicine

## 2014-08-08 ENCOUNTER — Encounter: Payer: Self-pay | Admitting: General Surgery

## 2014-08-09 ENCOUNTER — Ambulatory Visit (INDEPENDENT_AMBULATORY_CARE_PROVIDER_SITE_OTHER): Payer: Medicare Other | Admitting: General Surgery

## 2014-08-09 ENCOUNTER — Encounter: Payer: Self-pay | Admitting: General Surgery

## 2014-08-09 VITALS — BP 120/78 | HR 70 | Resp 12 | Ht 67.0 in | Wt 174.0 lb

## 2014-08-09 DIAGNOSIS — R319 Hematuria, unspecified: Secondary | ICD-10-CM

## 2014-08-09 DIAGNOSIS — K439 Ventral hernia without obstruction or gangrene: Secondary | ICD-10-CM

## 2014-08-09 NOTE — Progress Notes (Signed)
Patient ID: Laura Reed, female   DOB: 1961-06-25, 54 y.o.   MRN: [id number]  Chief Complaint  Patient presents with  . Follow-up    evaluation of ventral hernia    HPI Laura Reed is a 54 y.o. female.  She is here for evaluation of a possible hernia. She states she noticed it around the first of part of 2014 after her gall bladder surgery. She state that for the past year it has gotten larger. Minimal pain. She does have mild constipation and uses metamucil and stool softeners.  She has noticed seeing blood in her urine x 2 over the past 2 weeks. She has occasional stress incontience.    HPI  Past Medical History  Diagnosis Date  . Diabetes mellitus   . Hypertension   . Hemorrhoid   . PVD (peripheral vascular disease)     Past Surgical History  Procedure Laterality Date  . Coronary artery bypass graft  2009    3 vessel  . Arterial bypass surgry  2009, 2013 x 2    right leg , done in [place]  . Cesarean section    . Carotid endarterectomy  July 2015    Dr Delana Meyer  . Cholecystectomy  03-03-12    Porcelain gallbladder, gallstones,  [facility]  . Colonoscopy w/ biopsies  04/28/2012    Hyperplastic rectal polyps.    Family History  Problem Relation Age of Onset  . Cancer Mother 31    Lung Cancer  . Cancer Father 26    Lung Ca    Social History History  Substance Use Topics  . Smoking status: Former Smoker    Types: Cigarettes    Quit date: 03/30/2011  . Smokeless tobacco: Never Used  . Alcohol Use: No    No Known Allergies  Current Outpatient Prescriptions  Medication Sig Dispense Refill  . ALPRAZolam (XANAX) 0.5 MG tablet TAKE 1 TABLET BY MOUTH THREE TIMES DAILY AS NEEDED 90 tablet 3  . aspirin 81 MG tablet Take 81 mg by mouth daily.    Marland Kitchen atorvastatin (LIPITOR) 20 MG tablet TAKE 1 TABLET BY MOUTH EVERY DAY 90 tablet 1  . Biotin 1000 MCG tablet Take 1,000 mcg by mouth 3 (three) times daily.    . Cholecalciferol (VITAMIN D3) 2000 UNITS TABS Take 1 capsule  by mouth daily.    . clopidogrel (PLAVIX) 75 MG tablet TAKE 1 TABLET BY MOUTH ONCE DAILY 90 tablet 1  . DIGOX 125 MCG tablet TAKE 1 TABLET BY MOUTH EVERY DAY 90 tablet 0  . gabapentin (NEURONTIN) 600 MG tablet Take 1 tablet (600 mg total) by mouth 3 (three) times daily. 270 tablet 3  . Insulin Aspart Prot & Aspart (NOVOLOG MIX 70/30 FLEXPEN) (70-30) 100 UNIT/ML Pen Inject 36 units in the AM and 36 units in the afternoon 30 mL 5  . LEVEMIR FLEXTOUCH 100 UNIT/ML Pen INJECT 30 UNITS UNDER THE SKIN EVERY NIGHT AT BEDTIME 15 mL 5  . vitamin C (ASCORBIC ACID) 500 MG tablet Take 1,000 mg by mouth 2 (two) times daily.     No current facility-administered medications for this visit.    Review of Systems Review of Systems  Constitutional: Negative.   Respiratory: Negative.   Cardiovascular: Negative.   Gastrointestinal: Positive for constipation and abdominal distention.  Genitourinary: Positive for hematuria. Negative for dysuria and difficulty urinating.    Blood pressure 120/78, pulse 70, resp. rate 12, height 5\' 7"  (1.702 m), weight 174 lb (78.926 kg).  Patient  reports last menstrual period was in 2006.  Physical Exam Physical Exam  Constitutional: She is oriented to person, place, and time. She appears well-developed and well-nourished.  Neck: Neck supple.  Cardiovascular: Normal rate, regular rhythm and normal heart sounds.   Pulses:      Femoral pulses are 2+ on the right side, and 2+ on the left side. Pulmonary/Chest: Effort normal and breath sounds normal.  Abdominal: Soft. Bowel sounds are normal. There is no tenderness. A hernia is present.    6 cm bulge right of midline above the umbilicus   Lymphadenopathy:    She has no cervical adenopathy.  Neurological: She is alert and oriented to person, place, and time.  Skin: Skin is warm and dry.    Data Reviewed 2013 operative note and pathology report.  Assessment    Ventral hernia.  Hematuria,history suggestive of  vaginal bleeding or UTI.    Plan    We'll arrange for a CT of the abdomen and pelvis with and without contrast to assess for a source of bleeding. The patient may require cystoscopy prior to any other procedure.  Once the source of the hematuria is identified, she'll be a candidate for ventral hernia repair. After review the CT will have a better idea of whether this should be laparoscopic or open.  With her report of difficulty initiating defecation, glycerin suppositories have been suggested.      Hernia precautions and incarceration were discussed with the patient. If they develop symptoms of an incarcerated hernia, they were encouraged to seek prompt medical attention.  I have recommended repair of the hernia using mesh on an outpatient basis in the near future. The risk of infection was reviewed. The role of prosthetic mesh to minimize the risk of recurrence was reviewed.  Patient has been scheduled for a CT abdomen/pelvis with and without IV contrast (no oral contrast) at  [facility] for 08-14-14 at 11:30 am (arrive 11:15 am). Prep: no solids 4 hours prior but patient may have clear liquids up until exam time and take medication list. Patient verbalizes understanding.    PCP:  Mattie Marlin 08/10/2014, 5:06 PM

## 2014-08-09 NOTE — Patient Instructions (Addendum)
Hernia A hernia occurs when an internal organ pushes out through a weak spot in the abdominal wall. Hernias most commonly occur in the groin and around the navel. Hernias often can be pushed back into place (reduced). Most hernias tend to get worse over time. Some abdominal hernias can get stuck in the opening (irreducible or incarcerated hernia) and cannot be reduced. An irreducible abdominal hernia which is tightly squeezed into the opening is at risk for impaired blood supply (strangulated hernia). A strangulated hernia is a medical emergency. Because of the risk for an irreducible or strangulated hernia, surgery may be recommended to repair a hernia. CAUSES   Heavy lifting.  Prolonged coughing.  Straining to have a bowel movement.  A cut (incision) made during an abdominal surgery. HOME CARE INSTRUCTIONS   Bed rest is not required. You may continue your normal activities.  Avoid lifting more than 10 pounds (4.5 kg) or straining.  Cough gently. If you are a smoker it is best to stop. Even the best hernia repair can break down with the continual strain of coughing. Even if you do not have your hernia repaired, a cough will continue to aggravate the problem.  Do not wear anything tight over your hernia. Do not try to keep it in with an outside bandage or truss. These can damage abdominal contents if they are trapped within the hernia sac.  Eat a normal diet.  Avoid constipation. Straining over long periods of time will increase hernia size and encourage breakdown of repairs. If you cannot do this with diet alone, stool softeners may be used. SEEK IMMEDIATE MEDICAL CARE IF:   You have a fever.  You develop increasing abdominal pain.  You feel nauseous or vomit.  Your hernia is stuck outside the abdomen, looks discolored, feels hard, or is tender.  You have any changes in your bowel habits or in the hernia that are unusual for you.  You have increased pain or swelling around the  hernia.  You cannot push the hernia back in place by applying gentle pressure while lying down. MAKE SURE YOU:   Understand these instructions.  Will watch your condition.  Will get help right away if you are not doing well or get worse. Document Released: 06/23/2005 Document Revised: 09/15/2011 Document Reviewed: 02/10/2008 [facility] Patient Information 2015 [place], [place]. This information is not intended to replace advice given to you by your health care provider. Make sure you discuss any questions you have with your health care provider.    Patient has been scheduled for a CT abdomen/pelvis with and without IV contrast (no oral contrast) at  [facility] for 08-14-14 at 11:30 am (arrive 11:15 am). Prep: no solids 4 hours prior but patient may have clear liquids up until exam time and take medication list. Patient verbalizes understanding.

## 2014-08-10 ENCOUNTER — Encounter: Payer: Self-pay | Admitting: General Surgery

## 2014-08-10 DIAGNOSIS — K469 Unspecified abdominal hernia without obstruction or gangrene: Secondary | ICD-10-CM | POA: Insufficient documentation

## 2014-08-10 DIAGNOSIS — R319 Hematuria, unspecified: Secondary | ICD-10-CM | POA: Insufficient documentation

## 2014-08-14 ENCOUNTER — Ambulatory Visit: Payer: Self-pay | Admitting: General Surgery

## 2014-08-14 ENCOUNTER — Encounter: Payer: Self-pay | Admitting: General Surgery

## 2014-08-14 DIAGNOSIS — I700 Atherosclerosis of aorta: Secondary | ICD-10-CM | POA: Diagnosis not present

## 2014-08-14 DIAGNOSIS — N2889 Other specified disorders of kidney and ureter: Secondary | ICD-10-CM | POA: Diagnosis not present

## 2014-08-14 DIAGNOSIS — R918 Other nonspecific abnormal finding of lung field: Secondary | ICD-10-CM | POA: Diagnosis not present

## 2014-08-14 DIAGNOSIS — N289 Disorder of kidney and ureter, unspecified: Secondary | ICD-10-CM | POA: Diagnosis not present

## 2014-08-14 DIAGNOSIS — K861 Other chronic pancreatitis: Secondary | ICD-10-CM | POA: Diagnosis not present

## 2014-08-15 ENCOUNTER — Other Ambulatory Visit: Payer: Self-pay | Admitting: Internal Medicine

## 2014-08-16 ENCOUNTER — Telehealth: Payer: Self-pay | Admitting: *Deleted

## 2014-08-16 NOTE — Telephone Encounter (Signed)
-----   Message from Robert Bellow, MD sent at 08/15/2014  8:48 PM EST ----- Patient needs an early appointment with urology (here or [facility]) re: renal mass on CT obtained for hematuria. Erlene Quan here or [facility]).

## 2014-08-16 NOTE — Telephone Encounter (Signed)
Patient has been scheduled for an appointment with Dr. Hollice Espy at [facility] for 09-07-14 at 9:30 am (arrive 9:15 am). This patient is aware of date, time, and instructions. She verbalizes understanding.   Records have been forwarded for MD review. No need for patient to bring disc from recent CT report. Per office staff, report is sufficient.

## 2014-08-17 DIAGNOSIS — N2889 Other specified disorders of kidney and ureter: Secondary | ICD-10-CM | POA: Diagnosis not present

## 2014-08-19 ENCOUNTER — Encounter: Payer: Self-pay | Admitting: Internal Medicine

## 2014-08-19 ENCOUNTER — Other Ambulatory Visit: Payer: Self-pay | Admitting: Internal Medicine

## 2014-08-19 DIAGNOSIS — R918 Other nonspecific abnormal finding of lung field: Secondary | ICD-10-CM | POA: Insufficient documentation

## 2014-08-19 DIAGNOSIS — N2889 Other specified disorders of kidney and ureter: Secondary | ICD-10-CM | POA: Insufficient documentation

## 2014-08-22 ENCOUNTER — Other Ambulatory Visit: Payer: Self-pay | Admitting: Internal Medicine

## 2014-08-24 DIAGNOSIS — E119 Type 2 diabetes mellitus without complications: Secondary | ICD-10-CM | POA: Insufficient documentation

## 2014-08-24 DIAGNOSIS — Z01818 Encounter for other preprocedural examination: Secondary | ICD-10-CM | POA: Diagnosis not present

## 2014-08-24 DIAGNOSIS — I739 Peripheral vascular disease, unspecified: Secondary | ICD-10-CM | POA: Diagnosis not present

## 2014-08-24 DIAGNOSIS — I2510 Atherosclerotic heart disease of native coronary artery without angina pectoris: Secondary | ICD-10-CM | POA: Insufficient documentation

## 2014-08-24 DIAGNOSIS — E782 Mixed hyperlipidemia: Secondary | ICD-10-CM | POA: Insufficient documentation

## 2014-08-24 DIAGNOSIS — I25810 Atherosclerosis of coronary artery bypass graft(s) without angina pectoris: Secondary | ICD-10-CM | POA: Diagnosis not present

## 2014-08-30 DIAGNOSIS — L851 Acquired keratosis [keratoderma] palmaris et plantaris: Secondary | ICD-10-CM | POA: Diagnosis not present

## 2014-08-30 DIAGNOSIS — E119 Type 2 diabetes mellitus without complications: Secondary | ICD-10-CM | POA: Diagnosis not present

## 2014-08-30 DIAGNOSIS — M2040 Other hammer toe(s) (acquired), unspecified foot: Secondary | ICD-10-CM | POA: Diagnosis not present

## 2014-09-04 ENCOUNTER — Ambulatory Visit: Payer: Self-pay | Admitting: Urology

## 2014-09-04 DIAGNOSIS — Z01812 Encounter for preprocedural laboratory examination: Secondary | ICD-10-CM | POA: Diagnosis not present

## 2014-09-04 DIAGNOSIS — R310 Gross hematuria: Secondary | ICD-10-CM | POA: Diagnosis not present

## 2014-09-04 DIAGNOSIS — I509 Heart failure, unspecified: Secondary | ICD-10-CM | POA: Diagnosis not present

## 2014-09-04 DIAGNOSIS — E118 Type 2 diabetes mellitus with unspecified complications: Secondary | ICD-10-CM | POA: Diagnosis not present

## 2014-09-04 DIAGNOSIS — Z7982 Long term (current) use of aspirin: Secondary | ICD-10-CM | POA: Diagnosis not present

## 2014-09-04 DIAGNOSIS — I10 Essential (primary) hypertension: Secondary | ICD-10-CM | POA: Diagnosis not present

## 2014-09-11 ENCOUNTER — Telehealth: Payer: Self-pay | Admitting: Internal Medicine

## 2014-09-11 ENCOUNTER — Ambulatory Visit: Payer: Self-pay | Admitting: Urology

## 2014-09-11 DIAGNOSIS — Z801 Family history of malignant neoplasm of trachea, bronchus and lung: Secondary | ICD-10-CM | POA: Diagnosis not present

## 2014-09-11 DIAGNOSIS — Z951 Presence of aortocoronary bypass graft: Secondary | ICD-10-CM | POA: Diagnosis not present

## 2014-09-11 DIAGNOSIS — C652 Malignant neoplasm of left renal pelvis: Secondary | ICD-10-CM | POA: Diagnosis not present

## 2014-09-11 DIAGNOSIS — N2889 Other specified disorders of kidney and ureter: Secondary | ICD-10-CM | POA: Diagnosis not present

## 2014-09-11 DIAGNOSIS — I10 Essential (primary) hypertension: Secondary | ICD-10-CM | POA: Diagnosis not present

## 2014-09-11 DIAGNOSIS — I2510 Atherosclerotic heart disease of native coronary artery without angina pectoris: Secondary | ICD-10-CM | POA: Diagnosis not present

## 2014-09-11 DIAGNOSIS — Z7982 Long term (current) use of aspirin: Secondary | ICD-10-CM | POA: Diagnosis not present

## 2014-09-11 DIAGNOSIS — Z89412 Acquired absence of left great toe: Secondary | ICD-10-CM | POA: Diagnosis not present

## 2014-09-11 DIAGNOSIS — Z79899 Other long term (current) drug therapy: Secondary | ICD-10-CM | POA: Diagnosis not present

## 2014-09-11 DIAGNOSIS — Z7902 Long term (current) use of antithrombotics/antiplatelets: Secondary | ICD-10-CM | POA: Diagnosis not present

## 2014-09-11 DIAGNOSIS — Z87891 Personal history of nicotine dependence: Secondary | ICD-10-CM | POA: Diagnosis not present

## 2014-09-11 DIAGNOSIS — N2882 Megaloureter: Secondary | ICD-10-CM | POA: Diagnosis not present

## 2014-09-11 DIAGNOSIS — I509 Heart failure, unspecified: Secondary | ICD-10-CM | POA: Diagnosis not present

## 2014-09-11 DIAGNOSIS — Z9049 Acquired absence of other specified parts of digestive tract: Secondary | ICD-10-CM | POA: Diagnosis not present

## 2014-09-11 DIAGNOSIS — Z89421 Acquired absence of other right toe(s): Secondary | ICD-10-CM | POA: Diagnosis not present

## 2014-09-11 DIAGNOSIS — Z794 Long term (current) use of insulin: Secondary | ICD-10-CM | POA: Diagnosis not present

## 2014-09-11 DIAGNOSIS — K469 Unspecified abdominal hernia without obstruction or gangrene: Secondary | ICD-10-CM | POA: Diagnosis not present

## 2014-09-11 DIAGNOSIS — I739 Peripheral vascular disease, unspecified: Secondary | ICD-10-CM | POA: Diagnosis not present

## 2014-09-11 DIAGNOSIS — H3530 Unspecified macular degeneration: Secondary | ICD-10-CM | POA: Diagnosis not present

## 2014-09-11 DIAGNOSIS — R310 Gross hematuria: Secondary | ICD-10-CM | POA: Diagnosis not present

## 2014-09-11 DIAGNOSIS — E118 Type 2 diabetes mellitus with unspecified complications: Secondary | ICD-10-CM | POA: Diagnosis not present

## 2014-09-11 NOTE — Telephone Encounter (Signed)
Pa started for patient on cover [employer] , PA to be faxed to office.

## 2014-09-12 NOTE — Telephone Encounter (Signed)
Pa Placed ni Dr. Lupita Dawn red folder.

## 2014-09-13 NOTE — Telephone Encounter (Signed)
PA faxed and copied for scan and billing for Novolog mix

## 2014-09-13 NOTE — Telephone Encounter (Signed)
PA was denied. Form placed in Dr. Lupita Dawn box for review.

## 2014-09-14 ENCOUNTER — Ambulatory Visit: Payer: 59 | Admitting: Internal Medicine

## 2014-09-18 DIAGNOSIS — C642 Malignant neoplasm of left kidney, except renal pelvis: Secondary | ICD-10-CM | POA: Diagnosis not present

## 2014-09-18 DIAGNOSIS — N2889 Other specified disorders of kidney and ureter: Secondary | ICD-10-CM | POA: Diagnosis not present

## 2014-09-25 ENCOUNTER — Telehealth: Payer: Self-pay | Admitting: *Deleted

## 2014-09-25 DIAGNOSIS — E11351 Type 2 diabetes mellitus with proliferative diabetic retinopathy with macular edema: Secondary | ICD-10-CM | POA: Diagnosis not present

## 2014-09-25 NOTE — Telephone Encounter (Signed)
Miquel Dunn, the surgery scheduler at Dr. Cherrie Gauze office, called to see if we would like to coordinate left robotic nephroureterectomy and ventral hernia repair. Per Miquel Dunn, Dr. Cherrie Gauze surgery schedule is out till the end of April and she normally does surgery on Monday and Wednesday.   Patient wishes to proceed with hernia surgery and not wait till the end of April.   Patient has been scheduled for a pre-op visit with Dr. Bary Castilla on 09-28-14 at 11:15 am. She will pre-admit on 09-28-14 at 1:30 pm.  She will also need to stop Plavix one week prior to surgery.   [facility] will be informed of patient's wishes.

## 2014-09-28 ENCOUNTER — Encounter: Payer: Self-pay | Admitting: General Surgery

## 2014-09-28 ENCOUNTER — Other Ambulatory Visit: Payer: Self-pay | Admitting: Internal Medicine

## 2014-09-28 ENCOUNTER — Ambulatory Visit (INDEPENDENT_AMBULATORY_CARE_PROVIDER_SITE_OTHER): Payer: Medicare Other | Admitting: General Surgery

## 2014-09-28 VITALS — BP 130/62 | HR 77 | Resp 15 | Ht 67.0 in

## 2014-09-28 DIAGNOSIS — C642 Malignant neoplasm of left kidney, except renal pelvis: Secondary | ICD-10-CM | POA: Diagnosis not present

## 2014-09-28 DIAGNOSIS — C649 Malignant neoplasm of unspecified kidney, except renal pelvis: Secondary | ICD-10-CM | POA: Insufficient documentation

## 2014-09-28 DIAGNOSIS — K439 Ventral hernia without obstruction or gangrene: Secondary | ICD-10-CM | POA: Diagnosis not present

## 2014-09-28 DIAGNOSIS — Z85528 Personal history of other malignant neoplasm of kidney: Secondary | ICD-10-CM | POA: Insufficient documentation

## 2014-09-28 NOTE — Patient Instructions (Addendum)
Coordinate with with her kidney surgery later April.    Ventral Hernia A ventral hernia (also called an incisional hernia) is a hernia that occurs at the site of a previous surgical cut (incision) in the abdomen. The abdominal wall spans from your lower chest down to your pelvis. If the abdominal wall is weakened from a surgical incision, a hernia can occur. A hernia is a bulge of bowel or muscle tissue pushing out on the weakened part of the abdominal wall. Ventral hernias can get bigger from straining or lifting. Obese and older people are at higher risk for a ventral hernia. People who develop infections after surgery or require repeat incisions at the same site on the abdomen are also at increased risk. CAUSES  A ventral hernia occurs because of weakness in the abdominal wall at an incision site.  SYMPTOMS  Common symptoms include:  A visible bulge or lump on the abdominal wall.  Pain or tenderness around the lump.  Increased discomfort if you cough or make a sudden movement. If the hernia has blocked part of the intestine, a serious complication can occur (incarcerated or strangulated hernia). This can become a problem that requires emergency surgery because the blood flow to the blocked intestine may be cut off. Symptoms may include:  Feeling sick to your stomach (nauseous).  Throwing up (vomiting).  Stomach swelling (distention) or bloating.  Fever.  Rapid heartbeat. DIAGNOSIS  Your health care provider will take a medical history and perform a physical exam. Various tests may be ordered, such as:  Blood tests.  Urine tests.  Ultrasonography.  X-rays.  Computed tomography (CT). TREATMENT  Watchful waiting may be all that is needed for a smaller hernia that does not cause symptoms. Your health care provider may recommend the use of a supportive belt (truss) that helps to keep the abdominal wall intact. For larger hernias or those that cause pain, surgery to repair the  hernia is usually recommended. If a hernia becomes strangulated, emergency surgery needs to be done right away. HOME CARE INSTRUCTIONS  Avoid putting pressure or strain on the abdominal area.  Avoid heavy lifting.  Use good body positioning for physical tasks. Ask your health care provider about proper body positioning.  Use a supportive belt as directed by your health care provider.  Maintain a healthy weight.  Eat foods that are high in fiber, such as whole grains, fruits, and vegetables. Fiber helps prevent difficult bowel movements (constipation).  Drink enough fluids to keep your urine clear or pale yellow.  Follow up with your health care provider as directed. SEEK MEDICAL CARE IF:   Your hernia seems to be getting larger or more painful. SEEK IMMEDIATE MEDICAL CARE IF:   You have abdominal pain that is sudden and sharp.  Your pain becomes severe.  You have repeated vomiting.  You are sweating a lot.  You notice a rapid heartbeat.  You develop a fever. MAKE SURE YOU:   Understand these instructions.  Will watch your condition.  Will get help right away if you are not doing well or get worse. Document Released: 06/09/2012 Document Revised: 11/07/2013 Document Reviewed: 06/09/2012 [facility] Patient Information 2015 [place], [place]. This information is not intended to replace advice given to you by your health care provider. Make sure you discuss any questions you have with your health care provider.

## 2014-09-28 NOTE — Progress Notes (Signed)
Patient ID: Laura Reed, female   DOB: 11/10/1960, 54 y.o.   MRN: [id number]  Chief Complaint  Patient presents with  . Pre-op Exam    ventral hernia    HPI Laura Reed is a 54 y.o. female here today for her pre op ventral hernia repair scheduled for 10/05/14. She reports that she is doing well just nervous.    HPI  Past Medical History  Diagnosis Date  . Diabetes mellitus   . Hypertension   . Hemorrhoid   . PVD (peripheral vascular disease)     Past Surgical History  Procedure Laterality Date  . Coronary artery bypass graft  2009    3 vessel  . Arterial bypass surgry  2009, 2013 x 2    right leg , done in [place]  . Cesarean section    . Carotid endarterectomy  July 2015    Dr Delana Meyer  . Cholecystectomy  03-03-12    Porcelain gallbladder, gallstones,  [place]  . Colonoscopy w/ biopsies  04/28/2012    Hyperplastic rectal polyps.    Family History  Problem Relation Age of Onset  . Cancer Mother 72    Lung Cancer  . Cancer Father 41    Lung Ca    Social History History  Substance Use Topics  . Smoking status: Former Smoker    Types: Cigarettes    Quit date: 03/30/2011  . Smokeless tobacco: Never Used  . Alcohol Use: No    No Known Allergies  Current Outpatient Prescriptions  Medication Sig Dispense Refill  . ALPRAZolam (XANAX) 0.5 MG tablet TAKE 1 TABLET BY MOUTH THREE TIMES DAILY AS NEEDED 90 tablet 0  . aspirin 81 MG tablet Take 81 mg by mouth daily.    Marland Kitchen atorvastatin (LIPITOR) 20 MG tablet TAKE 1 TABLET BY MOUTH EVERY DAY 90 tablet 1  . Biotin 1000 MCG tablet Take 1,000 mcg by mouth 3 (three) times daily.    . Cholecalciferol (VITAMIN D3) 2000 UNITS TABS Take 1 capsule by mouth daily.    . clopidogrel (PLAVIX) 75 MG tablet TAKE 1 TABLET BY MOUTH EVERY DAY 90 tablet 0  . DIGOX 125 MCG tablet TAKE 1 TABLET BY MOUTH EVERY DAY 90 tablet 0  . gabapentin (NEURONTIN) 600 MG tablet Take 1 tablet (600 mg total) by mouth 3 (three) times daily. 270 tablet 3   . Insulin Aspart Prot & Aspart (NOVOLOG MIX 70/30 FLEXPEN) (70-30) 100 UNIT/ML Pen Inject 36 units in the AM and 36 units in the afternoon 30 mL 5  . LEVEMIR FLEXTOUCH 100 UNIT/ML Pen INJECT 30 UNITS UNDER THE SKIN EVERY NIGHT AT BEDTIME 15 mL 5  . vitamin C (ASCORBIC ACID) 500 MG tablet Take 1,000 mg by mouth 2 (two) times daily.     No current facility-administered medications for this visit.    Review of Systems Review of Systems  Constitutional: Negative.   Respiratory: Negative.   Cardiovascular: Negative.     Blood pressure 130/62, pulse 77, resp. rate 15, height 5\' 7"  (1.702 m).  Physical Exam Physical Exam  Constitutional: She is oriented to person, place, and time. She appears well-developed and well-nourished.  Neck: Neck supple.  Cardiovascular: Normal rate and regular rhythm.   Murmur heard.  Systolic murmur is present with a grade of 2/6  Pulmonary/Chest: Effort normal and breath sounds normal.  Abdominal: Soft. Normal appearance. There is no tenderness.    Defect is right of midline at umbilicus.   Lymphadenopathy:  She has no cervical adenopathy.  Neurological: She is alert and oriented to person, place, and time.  Skin: Skin is warm and dry.    Data Reviewed Previously completed CT scan showed a 3-4 solitary fascial defect near the umbilicus. Also noted was a 1.5 cm mass in the renal pelvis. Subsequent urologic evaluation has determined this is malignant.  Assessment    Ventral hernia, asymptomatic except for enlargement over time.  Recently identified left renal cancer.    Plan    By report, as no consultation note has been received as of this date from urology, the patient will require a robotic nephrectomy. It would make perfect sense to complete this at the time of the ventral hernia repair, allowing an extraction site for the kidney followed by mesh repair of the defect. We will coordinate with her urologist, tentatively planning for surgery in  April.   Hernia precautions and incarceration were discussed with the patient. If they develop symptoms of an incarcerated hernia, they were encouraged to seek prompt medical attention.  I have recommended repair of the hernia using mesh at the time of her nephrectomy. The risk of infection was reviewed. The role of prosthetic mesh to minimize the risk of recurrence was reviewed.     PCP:  Mattie Marlin 09/28/2014, 12:12 PM

## 2014-10-04 ENCOUNTER — Ambulatory Visit (INDEPENDENT_AMBULATORY_CARE_PROVIDER_SITE_OTHER): Payer: 59 | Admitting: Internal Medicine

## 2014-10-04 ENCOUNTER — Encounter: Payer: Self-pay | Admitting: Internal Medicine

## 2014-10-04 ENCOUNTER — Ambulatory Visit: Payer: Medicare Other | Admitting: General Surgery

## 2014-10-04 VITALS — BP 146/78 | HR 75 | Temp 98.5°F | Resp 16 | Ht 67.0 in | Wt 177.0 lb

## 2014-10-04 DIAGNOSIS — C642 Malignant neoplasm of left kidney, except renal pelvis: Secondary | ICD-10-CM

## 2014-10-04 DIAGNOSIS — E1169 Type 2 diabetes mellitus with other specified complication: Secondary | ICD-10-CM

## 2014-10-04 DIAGNOSIS — E1159 Type 2 diabetes mellitus with other circulatory complications: Secondary | ICD-10-CM | POA: Diagnosis not present

## 2014-10-04 DIAGNOSIS — E1142 Type 2 diabetes mellitus with diabetic polyneuropathy: Secondary | ICD-10-CM | POA: Diagnosis not present

## 2014-10-04 DIAGNOSIS — R918 Other nonspecific abnormal finding of lung field: Secondary | ICD-10-CM

## 2014-10-04 DIAGNOSIS — E785 Hyperlipidemia, unspecified: Secondary | ICD-10-CM | POA: Diagnosis not present

## 2014-10-04 DIAGNOSIS — I739 Peripheral vascular disease, unspecified: Secondary | ICD-10-CM | POA: Diagnosis not present

## 2014-10-04 DIAGNOSIS — E1165 Type 2 diabetes mellitus with hyperglycemia: Secondary | ICD-10-CM

## 2014-10-04 DIAGNOSIS — IMO0002 Reserved for concepts with insufficient information to code with codable children: Secondary | ICD-10-CM

## 2014-10-04 DIAGNOSIS — G629 Polyneuropathy, unspecified: Secondary | ICD-10-CM

## 2014-10-04 DIAGNOSIS — E663 Overweight: Secondary | ICD-10-CM

## 2014-10-04 DIAGNOSIS — E1151 Type 2 diabetes mellitus with diabetic peripheral angiopathy without gangrene: Secondary | ICD-10-CM

## 2014-10-04 LAB — COMPREHENSIVE METABOLIC PANEL
ALK PHOS: 76 U/L (ref 39–117)
ALT: 16 U/L (ref 0–35)
AST: 18 U/L (ref 0–37)
Albumin: 3.9 g/dL (ref 3.5–5.2)
BILIRUBIN TOTAL: 0.3 mg/dL (ref 0.2–1.2)
BUN: 26 mg/dL — ABNORMAL HIGH (ref 6–23)
CALCIUM: 9.4 mg/dL (ref 8.4–10.5)
CO2: 30 meq/L (ref 19–32)
CREATININE: 0.66 mg/dL (ref 0.40–1.20)
Chloride: 105 mEq/L (ref 96–112)
GFR: 98.45 mL/min (ref >60.00)
GLUCOSE: 94 mg/dL (ref 70–99)
POTASSIUM: 4.6 meq/L (ref 3.5–5.1)
SODIUM: 138 meq/L (ref 135–145)
Total Protein: 7.3 g/dL (ref 6.0–8.3)

## 2014-10-04 LAB — LDL CHOLESTEROL, DIRECT: Direct LDL: 71 mg/dL

## 2014-10-04 LAB — HEMOGLOBIN A1C: Hgb A1c MFr Bld: 7.2 % — ABNORMAL HIGH (ref 4.6–6.5)

## 2014-10-04 MED ORDER — LISINOPRIL 5 MG PO TABS: 5.0000 mg | ORAL_TABLET | Freq: Every day | ORAL | Status: DC

## 2014-10-04 MED ORDER — INSULIN LISPRO PROT & LISPRO (75-25 MIX) 100 UNIT/ML KWIKPEN: PEN_INJECTOR | SUBCUTANEOUS | Status: DC

## 2014-10-04 MED ORDER — ALPRAZOLAM 0.5 MG PO TABS: 0.5000 mg | ORAL_TABLET | Freq: Three times a day (TID) | ORAL | Status: DC | PRN

## 2014-10-04 NOTE — Patient Instructions (Signed)
Diabetes and Standards of Medical Care Diabetes is complicated. You may find that your diabetes team includes a dietitian, nurse, diabetes educator, eye doctor, and more. To help everyone know what is going on and to help you get the care you deserve, the following schedule of care was developed to help keep you on track. Below are the tests, exams, vaccines, medicines, education, and plans you will need. HbA1c test This test shows how well you have controlled your glucose over the past 2-3 months. It is used to see if your diabetes management plan needs to be adjusted.   It is performed at least 2 times a year if you are meeting treatment goals.  It is performed 4 times a year if therapy has changed or if you are not meeting treatment goals. Blood pressure test  This test is performed at every routine medical visit. The goal is less than 140/90 mm Hg for most people, but 130/80 mm Hg in some cases. Ask your health care provider about your goal. Dental exam  Follow up with the dentist regularly. Eye exam  If you are diagnosed with type 1 diabetes as a child, get an exam upon reaching the age of 37 years or older and have had diabetes for 3-5 years. Yearly eye exams are recommended after that initial eye exam.  If you are diagnosed with type 1 diabetes as an adult, get an exam within 5 years of diagnosis and then yearly.  If you are diagnosed with type 2 diabetes, get an exam as soon as possible after the diagnosis and then yearly. Foot care exam  Visual foot exams are performed at every routine medical visit. The exams check for cuts, injuries, or other problems with the feet.  A comprehensive foot exam should be done yearly. This includes visual inspection as well as assessing foot pulses and testing for loss of sensation.  Check your feet nightly for cuts, injuries, or other problems with your feet. Tell your health care provider if anything is not healing. Kidney function test (urine  microalbumin)  This test is performed once a year.  Type 1 diabetes: The first test is performed 5 years after diagnosis.  Type 2 diabetes: The first test is performed at the time of diagnosis.  A serum creatinine and estimated glomerular filtration rate (eGFR) test is done once a year to assess the level of chronic kidney disease (CKD), if present. Lipid profile (cholesterol, HDL, LDL, triglycerides)  Performed every 5 years for most people.  The goal for LDL is less than 100 mg/dL. If you are at high risk, the goal is less than 70 mg/dL.  The goal for HDL is 40 mg/dL-50 mg/dL for men and 50 mg/dL-60 mg/dL for women. An HDL cholesterol of 60 mg/dL or higher gives some protection against heart disease.  The goal for triglycerides is less than 150 mg/dL. Influenza vaccine, pneumococcal vaccine, and hepatitis B vaccine  The influenza vaccine is recommended yearly.  It is recommended that people with diabetes who are over 24 years old get the pneumonia vaccine. In some cases, two separate shots may be given. Ask your health care provider if your pneumonia vaccination is up to date.  The hepatitis B vaccine is also recommended for adults with diabetes. Diabetes self-management education  Education is recommended at diagnosis and ongoing as needed. Treatment plan  Your treatment plan is reviewed at every medical visit. Document Released: 04/20/2009 Document Revised: 11/07/2013 Document Reviewed: 11/23/2012 [facility] Patient Information 2015 [place],  [employer]. This information is not intended to replace advice given to you by your health care provider. Make sure you discuss any questions you have with your health care provider.  

## 2014-10-04 NOTE — Progress Notes (Addendum)
Patient ID: Laura Reed, female   DOB: [birth year], 53 y.o.   MRN: [id number]  Patient Active Problem List   Diagnosis Date Noted  . Overweight (BMI 25.0-29.9) 10/07/2014  . Renal cancer 09/28/2014  . Multiple pulmonary nodules determined by computed tomography of lung 08/19/2014  . Hematuria 08/10/2014  . Hernia of abdominal cavity 08/10/2014  . Hyperlipidemia associated with type 2 diabetes mellitus 06/17/2014  . S/P carotid endarterectomy 01/22/2014  . Arrhythmia 11/14/2013  . History of shingles 05/18/2013  . Preoperative evaluation of a medical condition to rule out surgical contraindications (TAR required) 04/06/2013  . Diabetic peripheral neuropathy associated with type 2 diabetes mellitus 04/06/2013  . Chronic hip pain 01/16/2013  . Routine general medical examination at a health care facility 06/26/2012  . PVD (peripheral vascular disease)   . History of tobacco abuse 04/29/2011  . Uncontrolled type 2 diabetes mellitus with peripheral circulatory disorder   . Hypertension   . Hemorrhoid     Subjective:  CC:   Chief Complaint  Patient presents with  . Follow-up  . Diabetes    HPI:   Laura Reed is a 54 y.o. female who presents for  Follow up on DM type 2, with neuropathy, nephropathy and PAD,  Hyperlipidemia and  new onset elevation of blood pressure   Since her last visit she was diagnosed with renal cell CA ,  Found incidentally on CT done during evaluation for verntral hernia repair .  Joint venral hernia repair and  nephrectomy are planned in April.   Lab Results  Component Value Date   HGBA1C 6.4 06/16/2014   Lab Results  Component Value Date   CHOL 120 06/16/2014   HDL 36.00* 06/16/2014   LDLCALC 58 06/16/2014   LDLDIRECT 68.2 05/18/2013   TRIG 132.0 06/16/2014   CHOLHDL 3 06/16/2014   Lab Results  Component Value Date   MICROALBUR 4.3* 06/16/2014     She feels generally well, is exercising several times per week and checking blood sugars  once daily at variable times.  BS have been under 150 fasting and < 170 post prandially.  Denies any recent hypoglyemic events.  Taking her  medications as directed. Following a carbohydrate modified diet 6 days per week.  Has neuropathy bit denies pain of extremities.  Appetite is good. Worst  sugar was 202 fasting after indulging,  Most have been  140 to 170.  Eating fuirt and some candy,  Avoiding all crackers but WASAs     Past Medical History  Diagnosis Date  . Diabetes mellitus   . Hypertension   . Hemorrhoid   . PVD (peripheral vascular disease)     Past Surgical History  Procedure Laterality Date  . Coronary artery bypass graft  2009    3 vessel  . Arterial bypass surgry  2009, 2013 x 2    right leg , done in [place]  . Cesarean section    . Carotid endarterectomy  July 2015    Dr Delana Meyer  . Cholecystectomy  03-03-12    Porcelain gallbladder, gallstones,  [place]  . Colonoscopy w/ biopsies  04/28/2012    Hyperplastic rectal polyps.       The following portions of the patient's history were reviewed and updated as appropriate: Allergies, current medications, and problem list.    Review of Systems:   Patient denies headache, fevers, malaise, unintentional weight loss, skin rash, eye pain, sinus congestion and sinus pain, sore throat, dysphagia,  hemoptysis , cough, dyspnea,  wheezing, chest pain, palpitations, orthopnea, edema, abdominal pain, nausea, melena, diarrhea, constipation, flank pain, dysuria, hematuria, urinary  Frequency, nocturia, numbness, tingling, seizures,  Focal weakness, Loss of consciousness,  Tremor, insomnia, depression, anxiety, and suicidal ideation.     History   Social History  . Marital Status: Married    Spouse Name: N/A  . Number of Children: N/A  . Years of Education: N/A   Occupational History  . Not on file.   Social History Main Topics  . Smoking status: Former Smoker    Types: Cigarettes    Quit date: 03/30/2011  . Smokeless  tobacco: Never Used  . Alcohol Use: No  . Drug Use: No  . Sexual Activity: Not Currently   Other Topics Concern  . Not on file   Social History Narrative    Objective:  Filed Vitals:   10/04/14 1007  BP: 146/78  Pulse: 75  Temp: 98.5 F (36.9 C)  Resp: 16     General appearance: alert, cooperative and appears stated age Ears: normal TM's and external ear canals both ears Throat: lips, mucosa, and tongue normal; teeth and gums normal Neck: no adenopathy, no carotid bruit, supple, symmetrical, trachea midline and thyroid not enlarged, symmetric, no tenderness/mass/nodules Back: symmetric, no curvature. ROM normal. No CVA tenderness. Lungs: clear to auscultation bilaterally Heart: regular rate and rhythm, S1, S2 normal, no murmur, click, rub or gallop Abdomen: soft, non-tender; bowel sounds normal; no masses,  no organomegaly Pulses: 2+ and symmetric Skin: Skin color, texture, turgor normal. No rashes or lesions Lymph nodes: Cervical, supraclavicular, and axillary nodes normal.  Assessment and Plan:  Uncontrolled type 2 diabetes mellitus with peripheral circulatory disorder Slight loss of control since last quarter ,  A1c has increased to 7.2 .  She is up-to-date on eye exams and has ongoing vascular and podiatry follow up for neuropathy and PAD. Marland KitchenRepeat labs done today,. ACE inhibitor has been resumed.  Lab Results  Component Value Date   HGBA1C 7.2* 10/04/2014    Lab Results  Component Value Date   CREATININE 0.66 10/04/2014   Lab Results  Component Value Date   MICROALBUR 4.3* 06/16/2014          Lab Results  Component Value Date   HGBA1C 7.2* 10/04/2014      Diabetic peripheral neuropathy associated with type 2 diabetes mellitus Foot exam was done today.. She has chronic loss of sensation diffusely,  Has had several amputations due to PAD with history of osteomyelitis.  No open ulcers today    Renal cancer Presumed based on incidental finding on  abd Ct. For nephrectomy and ventral hernia repair early April.     Hyperlipidemia associated with type 2 diabetes mellitus . Well controlled on current statin therapy.   Liver enzymes are normal , no changes today.  Lab Results  Component Value Date   CHOL 120 06/16/2014   HDL 36.00* 06/16/2014   LDLCALC 58 06/16/2014   LDLDIRECT 71.0 10/04/2014   TRIG 132.0 06/16/2014   CHOLHDL 3 06/16/2014   Lab Results  Component Value Date   ALT 16 10/04/2014   AST 18 10/04/2014   ALKPHOS 76 10/04/2014   BILITOT 0.3 10/04/2014        Multiple pulmonary nodules determined by computed tomography of lung Noted on CT adb/pelvis Feb 2016 in the setting of left kidney mass suspicious for CA and long tobacco abuse history.  She will need pulmonology evaluation following her nephrectomy to rule out second primary ca  Overweight (BMI 25.0-29.9) I have addressed  BMI and recommended a low glycemic index diet utilizing smaller more frequent meals to increase metabolism.  I have also recommended that patient start exercising with a goal of 30 minutes of aerobic exercise a minimum of 5 days per week.     Updated Medication List Outpatient Encounter Prescriptions as of 10/04/2014  Medication Sig  . ALPRAZolam (XANAX) 0.5 MG tablet Take 1 tablet (0.5 mg total) by mouth 3 (three) times daily as needed.  Marland Kitchen aspirin 81 MG tablet Take 81 mg by mouth daily.  Marland Kitchen atorvastatin (LIPITOR) 20 MG tablet TAKE 1 TABLET BY MOUTH EVERY DAY  . Biotin 1000 MCG tablet Take 1,000 mcg by mouth 3 (three) times daily.  . Cholecalciferol (VITAMIN D3) 2000 UNITS TABS Take 1 capsule by mouth daily.  . clopidogrel (PLAVIX) 75 MG tablet TAKE 1 TABLET BY MOUTH EVERY DAY  . DIGOX 125 MCG tablet TAKE 1 TABLET BY MOUTH EVERY DAY  . gabapentin (NEURONTIN) 600 MG tablet Take 1 tablet (600 mg total) by mouth 3 (three) times daily.  . Insulin Aspart Prot & Aspart (NOVOLOG MIX 70/30 FLEXPEN) (70-30) 100 UNIT/ML Pen Inject 36 units  in the AM and 36 units in the afternoon  . LEVEMIR FLEXTOUCH 100 UNIT/ML Pen INJECT 30 UNITS UNDER THE SKIN EVERY NIGHT AT BEDTIME  . vitamin C (ASCORBIC ACID) 500 MG tablet Take 1,000 mg by mouth 2 (two) times daily.  . [DISCONTINUED] ALPRAZolam (XANAX) 0.5 MG tablet TAKE 1 TABLET BY MOUTH THREE TIMES DAILY AS NEEDED  . Insulin Lispro Prot & Lispro (HUMALOG 75/25 MIX) (75-25) 100 UNIT/ML Kwikpen 30 units in the am 36 units in the pm  . lisinopril (PRINIVIL,ZESTRIL) 5 MG tablet Take 1 tablet (5 mg total) by mouth daily.   A total of 25 minutes of face to face time was spent with patient more than half of which was spent in counselling about the above mentioned conditions  and coordination of care   Orders Placed This Encounter  Procedures  . Comprehensive metabolic panel  . Hemoglobin A1c  . LDL cholesterol, direct    Return in about 3 months (around 01/04/2015) for follow up diabetes.

## 2014-10-06 ENCOUNTER — Encounter: Payer: Self-pay | Admitting: Internal Medicine

## 2014-10-07 ENCOUNTER — Encounter: Payer: Self-pay | Admitting: Internal Medicine

## 2014-10-07 ENCOUNTER — Other Ambulatory Visit: Payer: Self-pay | Admitting: Internal Medicine

## 2014-10-07 DIAGNOSIS — E663 Overweight: Secondary | ICD-10-CM | POA: Insufficient documentation

## 2014-10-07 DIAGNOSIS — C649 Malignant neoplasm of unspecified kidney, except renal pelvis: Secondary | ICD-10-CM | POA: Insufficient documentation

## 2014-10-07 NOTE — Assessment & Plan Note (Addendum)
Slight loss of control since last quarter ,  A1c has increased to 7.2 .  She is up-to-date on eye exams and has ongoing vascular and podiatry follow up for neuropathy and PAD. Marland KitchenRepeat labs done today,. ACE inhibitor has been resumed.  Lab Results  Component Value Date   HGBA1C 7.2* 10/04/2014    Lab Results  Component Value Date   CREATININE 0.66 10/04/2014   Lab Results  Component Value Date   MICROALBUR 4.3* 06/16/2014          Lab Results  Component Value Date   HGBA1C 7.2* 10/04/2014

## 2014-10-07 NOTE — Assessment & Plan Note (Signed)
.   Well controlled on current statin therapy.   Liver enzymes are normal , no changes today.  Lab Results  Component Value Date   CHOL 120 06/16/2014   HDL 36.00* 06/16/2014   LDLCALC 58 06/16/2014   LDLDIRECT 71.0 10/04/2014   TRIG 132.0 06/16/2014   CHOLHDL 3 06/16/2014   Lab Results  Component Value Date   ALT 16 10/04/2014   AST 18 10/04/2014   ALKPHOS 76 10/04/2014   BILITOT 0.3 10/04/2014

## 2014-10-07 NOTE — Assessment & Plan Note (Signed)
Foot exam was done today.. She has chronic loss of sensation diffusely,  Has had several amputations due to PAD with history of osteomyelitis.  No open ulcers today

## 2014-10-07 NOTE — Assessment & Plan Note (Signed)
Presumed based on incidental finding on abd Ct. For nephrectomy and ventral hernia repair early April.

## 2014-10-07 NOTE — Assessment & Plan Note (Signed)
Noted on CT adb/pelvis Feb 2016 in the setting of left kidney mass suspicious for CA and long tobacco abuse history.  She will need pulmonology evaluation following her nephrectomy to rule out second primary ca

## 2014-10-07 NOTE — Assessment & Plan Note (Signed)
I have addressed  BMI and recommended a low glycemic index diet utilizing smaller more frequent meals to increase metabolism.  I have also recommended that patient start exercising with a goal of 30 minutes of aerobic exercise a minimum of 5 days per week.  

## 2014-10-08 ENCOUNTER — Other Ambulatory Visit: Payer: Self-pay | Admitting: General Surgery

## 2014-10-08 DIAGNOSIS — K439 Ventral hernia without obstruction or gangrene: Secondary | ICD-10-CM

## 2014-10-09 ENCOUNTER — Telehealth: Payer: Self-pay | Admitting: *Deleted

## 2014-10-09 DIAGNOSIS — L723 Sebaceous cyst: Secondary | ICD-10-CM

## 2014-10-09 NOTE — Telephone Encounter (Signed)
Pt called states she has had a cyst on her left shoulder for approx a year.  Pt is requesting a referral to Dermatology to have it lanced.  Please advise

## 2014-10-10 NOTE — Telephone Encounter (Signed)
Patient notified

## 2014-10-10 NOTE — Telephone Encounter (Signed)
Referral is in process as requested 

## 2014-10-11 DIAGNOSIS — L97521 Non-pressure chronic ulcer of other part of left foot limited to breakdown of skin: Secondary | ICD-10-CM | POA: Diagnosis not present

## 2014-10-16 DIAGNOSIS — H2513 Age-related nuclear cataract, bilateral: Secondary | ICD-10-CM | POA: Diagnosis not present

## 2014-10-17 ENCOUNTER — Encounter: Payer: Self-pay | Admitting: General Surgery

## 2014-10-17 ENCOUNTER — Ambulatory Visit
Admit: 2014-10-17 | Disposition: A | Discharge status: Home / Self Care | Payer: Self-pay | Attending: Urology | Admitting: Urology

## 2014-10-17 DIAGNOSIS — C642 Malignant neoplasm of left kidney, except renal pelvis: Secondary | ICD-10-CM | POA: Diagnosis not present

## 2014-10-17 DIAGNOSIS — K439 Ventral hernia without obstruction or gangrene: Secondary | ICD-10-CM | POA: Diagnosis not present

## 2014-10-17 DIAGNOSIS — E119 Type 2 diabetes mellitus without complications: Secondary | ICD-10-CM | POA: Diagnosis not present

## 2014-10-17 DIAGNOSIS — I739 Peripheral vascular disease, unspecified: Secondary | ICD-10-CM | POA: Diagnosis not present

## 2014-10-17 DIAGNOSIS — Z79899 Other long term (current) drug therapy: Secondary | ICD-10-CM | POA: Diagnosis not present

## 2014-10-17 DIAGNOSIS — Z951 Presence of aortocoronary bypass graft: Secondary | ICD-10-CM | POA: Diagnosis not present

## 2014-10-17 DIAGNOSIS — Z01812 Encounter for preprocedural laboratory examination: Secondary | ICD-10-CM | POA: Diagnosis not present

## 2014-10-17 DIAGNOSIS — I10 Essential (primary) hypertension: Secondary | ICD-10-CM | POA: Diagnosis not present

## 2014-10-17 LAB — PROTIME-INR
INR: 0.9
PROTHROMBIN TIME: 11.9 s

## 2014-10-17 LAB — CBC WITH DIFFERENTIAL/PLATELET
Basophil #: 0.1 10*3/uL (ref 0.0–0.1)
Basophil %: 1 %
EOS ABS: 0.2 10*3/uL (ref 0.0–0.7)
EOS PCT: 1.5 %
HCT: 39.9 % (ref 35.0–47.0)
HGB: 13.2 g/dL (ref 12.0–16.0)
LYMPHS ABS: 1.8 10*3/uL (ref 1.0–3.6)
Lymphocyte %: 15.3 %
MCH: 28.5 pg (ref 26.0–34.0)
MCHC: 33.1 g/dL (ref 32.0–36.0)
MCV: 86 fL (ref 80–100)
Monocyte #: 0.8 x10 3/mm (ref 0.2–0.9)
Monocyte %: 6.3 %
Neutrophil #: 9.1 10*3/uL — ABNORMAL HIGH (ref 1.4–6.5)
Neutrophil %: 75.9 %
PLATELETS: 288 10*3/uL (ref 150–440)
RBC: 4.63 10*6/uL (ref 3.80–5.20)
RDW: 13.6 % (ref 11.5–14.5)
WBC: 11.9 10*3/uL — ABNORMAL HIGH (ref 3.6–11.0)

## 2014-10-17 LAB — BASIC METABOLIC PANEL
ANION GAP: 6 — AB (ref 7–16)
BUN: 29 mg/dL — ABNORMAL HIGH
CALCIUM: 8.9 mg/dL
CO2: 30 mmol/L
Chloride: 105 mmol/L
Creatinine: 0.65 mg/dL
EGFR (African American): >60
EGFR (Non-African Amer.): >60
Glucose: 157 mg/dL — ABNORMAL HIGH
Potassium: 4.4 mmol/L
Sodium: 141 mmol/L

## 2014-10-18 LAB — URINE CULTURE

## 2014-10-24 NOTE — Op Note (Signed)
PATIENT NAME:  Laura Reed, Laura Reed MR#:  [id number] DATE OF BIRTH:  1961-03-25  DATE OF PROCEDURE:  05/03/2012  PREOPERATIVE DIAGNOSIS: Cholelithiasis, calcifications in the gallbladder.   POSTOPERATIVE DIAGNOSIS: Cholelithiasis, calcifications in the gallbladder, large gallstone.   OPERATIVE PROCEDURE: Laparoscopic cholecystectomy with intraoperative cholangiograms.   SURGEON: Hervey Ard, MD  ANESTHESIA: General endotracheal under Dr. Benjamine Mola.   ESTIMATED BLOOD LOSS: 100 mL.   CLINICAL NOTE: This 54 year old woman recently had a CT scan completed for evaluation of vascular disease. She was found to have calcifications within the gallbladder as well as gallstones. She was felt to be a candidate for cholecystectomy.   DESCRIPTION OF PROCEDURE: The patient underwent general endotracheal anesthesia without difficulty. She received Kefzol intravenously due to a history of diabetes. She had pneumatic compression stockings and TED stockings for DVT prevention.   With the patient under adequate general endotracheal anesthesia, the abdomen was prepped with ChloraPrep and draped. A Veress needle was placed through a transumbilical incision. After assuring intra-abdominal location with the hanging drop test, the abdomen was insufflated with CO2 at 10 mmHg pressure. A 10 mm port was expanded and inspection showed no evidence of injury from initial port placement. The patient was placed in reverse Trendelenburg position and rolled to the left. An 11 mm Xcel port and two 5 mm step ports were placed under direct vision. The gallbladder was noted to have a very firm texture to the fundic portion. The area of the neck of the gallbladder was fairly soft. A Reddick screw retractor was placed into the fundus of the gallbladder after attempt at decompression yielded no fluid. The gallbladder was placed on cephalad traction. The neck of the gallbladder was grasped. The neck of the gallbladder was cleared and the cystic  duct identified. Fluoroscopic cholangiograms were then completed using 14 mL of one-half strength Conray 60. This showed prompt filling of the right and left hepatic ducts and free flow into the duodenum. A long cystic duct was identified. The cystic duct and cystic artery were doubly clipped and divided. There was noted to be bleeding from the liver bed. This was initially controlled with application of a piece of Surgicel. The gallbladder was then dissected free from the gallbladder bed and placed into the right upper quadrant. The area was reinspected and irrigated. The Surgicel had produced excellent hemostasis. It was elected to make use of 10 mL of Surgiflo to minimize any postoperative bleeding. The gallbladder was then placed in an Endo Catch bag and then delivered through the umbilical port site. It was necessary to make an approximately 4 cm incision to deliver the massively thickened enlarged gallbladder intact. The fascial defect was then closed with interrupted 0 Prolene figure-of-eight sutures. Pneumoperitoneum was reestablished. The right upper quadrant was inspected and good hemostasis appreciated. Final irrigation with lactated Ringer's was completed. The ports were then removed and abdomen desufflated under direct visualization. The final sutures at the umbilical fascial repair site were tied and the wound closed with interrupted 4-0 Vicryl subcuticular sutures. Benzoin and Steri-Strips followed by Telfa and Tegaderm dressings were applied. The gallbladder was opened and found to have a fairly thin gallbladder wall and a very large 3 x 5 cm stone in the fundus of the gallbladder. No mucosal masses were identified.   The patient tolerated the procedure well and was taken to the recovery room in good condition.  ____________________________ Robert Bellow, MD jwb:[id number] D: 05/03/2012 21:44:50 ET T: 05/04/2012 09:36:42 ET JOB#: [id number]  cc: Dellis Filbert  Amedeo Kinsman, MD, <Dictator> Katha Cabal, MD Deborra Medina, MD Sallee Hogrefe Amedeo Kinsman MD ELECTRONICALLY SIGNED 05/04/2012 20:45

## 2014-10-27 NOTE — Op Note (Signed)
PATIENT NAME:  Laura Reed, Laura Reed MR#:  [id number] DATE OF BIRTH:  07-10-1960  DATE OF PROCEDURE:  09/16/2012  PREOPERATIVE DIAGNOSES:  1.  Peripheral arterial disease with rest pain in both lower extremities.  2.  Severe peripheral vascular disease, status post multiple previous open and endovascular revascularizations.  3.  Hypertension.  4.  Diabetes.  5.  Coronary artery disease.   POSTOPERATIVE DIAGNOSES: 1.  Peripheral arterial disease with rest pain in both lower extremities.  2.  Severe peripheral vascular disease, status post multiple previous open and endovascular revascularizations.  3.  Hypertension.  4.  Diabetes.  5.  Coronary artery disease.   PROCEDURE: 1.  Ultrasound guidance for vascular access, left radial artery.  2.  Catheter placement into left to right femoral to femoral bypass from left radial approach.  3.  Catheter placement into the left profunda femoris artery from left radial approach.  4.  Aortogram and bilateral extremity angiograms.  5.  Percutaneous transluminal angioplasty of left profunda femoris artery and common femoral artery with 4 mm diameter angioplasty balloon.  6.  Percutaneous transluminal angioplasty of proximal portion of left to right femoral to femoral bypass and proximal anastomosis with a 5 mm diameter angioplasty balloon.  7.  Percutaneous transluminal angioplasty of proximal left common femoral artery and distal external iliac artery proximal to the bypass with 4 mm and 5 mm diameter angioplasty balloon.   SURGEON: Algernon Huxley, M.D.   ANESTHESIA: Local with moderate conscious sedation.   ESTIMATED BLOOD LOSS: 25 mL.   INDICATION FOR PROCEDURE: This is a 54 year old white female with an extensive vascular history. She has a left to right femoral to femoral bypass. I found a distal bypass on the right. These were threatened due to disease in the common femoral artery at and just proximal and distal to the femoral to femoral bypass  anastomosis. She was brought in to see if there is any opportunity for improvement for limb salvage. Otherwise, she will likely require bilateral amputations. Risks and benefits were discussed. Informed consent was obtained.   DESCRIPTION OF PROCEDURE: The patient is brought to the vascular and interventional radiology suite. The left right upper extremity was sterilely prepped and draped and a sterile surgical field was created. The left radial artery was visualized with ultrasound and found to widely patent. It was then accessed under direct ultrasound guidance without difficulty with a micropuncture needle. A micropuncture wire and sheath were placed. I upsized to a 5-French sheath. A pigtail catheter was then placed with the help of an advantage wire traversed down to the descending thoracic aorta and put into the L1 level, an aortogram was performed and it was then advanced and pelvic obliques performed. This showed an aorta with normal renal arteries, which were patent. The aorta itself was patent. The right common iliac and left common iliac arteries were both patent. The right external iliac artery was occluded on the pelvic obliques. The left external iliac artery was occluded distally just above the bypass graft. The common femoral artery was occluded. The profunda and superficial femoral arteries were occluded proximally as well.   This was clearly a dire situation and attempt at revascularization would be planned. She had had an open surgical reconstruction of this area with a Dacron patch. The 6-French shuttle sheath were placed over a Terumo advantage wire and the patient was given a total of 5000 units of intravenous heparin for systemic anticoagulation. I gained access to the left iliac artery. I  was able to cross the common femoral artery occlusion and gain access to, what appeared to be, profunda femoris artery on her imaging with a long angled catheter. I then exchanged for an 0.018 wire and  performed percutaneous transluminal angioplasty with a 4 mm diameter angioplasty balloon into the profunda femoris artery encompassing the common femoral artery and up above the bypass into the distal external iliac artery. A tight waste was taken which resolved with angioplasty and this was now significantly improved with flow through the common femoral artery and profunda femoris artery and actually superficial femoral artery also appeared patent proximally.   I then turned my attention to gaining access to the graft as there was still a high-grade stenosis from hyperplasia in the proximal portion of the graft and the graft was threatened, which would leave the right threat. I was able to cannulate this with a mild amount of difficulty with a Terumo advantage wire and the long angled catheter. I advanced the catheter into the femoral to femoral bypass midportion and performed selective right lower extremity angiogram. This demonstrated the right femoral anastomosis be widely patent. The femoral to distal bypass on the right was followed down and it was widely patent and she appeared to have 2 vessel runoff distally. At this point, I replaced an 0.018 wire and treated the proximal portion of the femoral to femoral bypass anastomotic stenosis with a 5 mm diameter angioplasty balloon. This balloon was then taken up into the common femoral artery and external iliac artery above this and treated as well. Completion angiogram following this showed markedly improved flow with excellent flow through the femoral artery and through the femoral to femoral bypass, without hemodynamically significant stenosis in any of the vessels treated. At this point, I elected to terminate the procedure. The wire and sheath were removed. Pressure was held. Sterile dressing was placed. The patient tolerated the procedure well and was taken to the recovery room in stable condition.   ____________________________ Algernon Huxley,  MD jsd:aw D: 09/16/2012 12:00:42 ET T: 09/16/2012 13:10:27 ET JOB#: [id number]  cc: Algernon Huxley, MD, <Dictator> Algernon Huxley MD ELECTRONICALLY SIGNED 09/20/2012 11:51

## 2014-10-27 NOTE — Op Note (Signed)
PATIENT NAME:  Laura Reed, Laura Reed MR#:  [id number] DATE OF BIRTH:  1961/05/19  DATE OF PROCEDURE:  06/03/2013  PREOPERATIVE DIAGNOSIS: Osteomyelitis, left great toe stump.  POSTOPERATIVE DIAGNOSIS: Osteomyelitis, left great toe stump.  PROCEDURE: Debridement of infected bone and first metatarsal left foot.   SURGEON: Sharlotte Alamo, DPM  ANESTHESIA: Local MAC.   HEMOSTASIS: None.   ESTIMATED BLOOD LOSS: 50 mL.   PATHOLOGY: Left first metatarsal head and joint.   IMPLANTS: Stimulan rapid cure vancomycin impregnated beads.  CULTURES: Deep wound cultures, left great toe joint.   COMPLICATIONS: None apparent.   OPERATIVE INDICATIONS: This is a 54 year old female with a long-term history of peripheral vascular disease and diabetes. She has previously had amputation of the left great toe. Recently underwent revascularization procedure on her left lower extremity. X-rays in the office last week with a new ulcerative area at the amputation stump site revealed continued bone infection at the remainder of the proximal phalanx of the great toe and decision was made for debridement of infected bone while she was in the hospital.   DESCRIPTION OF PROCEDURE: The patient was taken to the operating room and placed on the table in the supine position. Following adequate sedation, the left foot was anesthetized with 10 mL of 0.5% Sensorcaine plain around the first metatarsal. The foot was prepped and draped in the usual sterile fashion. Attention was then directed to the distal aspect of the left forefoot where an approximate 4 cm linear incision was made along the medial aspect of the first metatarsal extending onto the distal aspect of the amputation stump. The ulcerative area on the medial aspect was excised with the incision. The incision was deepened via sharp and blunt dissection down to the level of the joint. There was noted to be some fragmentation of the remainder of the proximal phalanx. The soft  tissues were freed from the capsular tissues at the first metatarsal and the distal first metatarsal head was then resected in toto using a pneumatic saw. The head and sesamoids and remainder of the joint were then removed in toto. There was some devitalized tissue and a culture was taken of this. Excisional debridement was then performed of the tissue all the way down to the deep subcutaneous tissue up to the adjacent bone. There was noted to be good healthy bleeding tissues throughout the procedure. The wound was then flushed with copious amounts of sterile saline. Vancomycin impregnated rapid cure Stimulan beads were then applied into the wound cavity. The wound was then closed with 4-0 nylon vertical mattress and simple interrupted sutures. Xeroform, 4 x 4's, ABD, and Kerlix were then applied followed by an Ace wrap. The patient tolerated the procedure and anesthesia well and was transported to the PAC-U with vital signs stable and in good condition.  ____________________________ Sharlotte Alamo, DPM tc:sb D: 06/03/2013 10:51:11 ET T: 06/03/2013 11:21:37 ET JOB#: [id number]  cc: Sharlotte Alamo, DPM, <Dictator> Ostin Mathey DPM ELECTRONICALLY SIGNED 06/06/2013 16:45

## 2014-10-27 NOTE — Op Note (Signed)
PATIENT NAME:  Laura Reed, KOPECKY MR#:  [id number] DATE OF BIRTH:  01/01/1961  DATE OF PROCEDURE:  04/08/2013  PREOPERATIVE DIAGNOSIS: Osteomyelitis, left great toe.   POSTOPERATIVE DIAGNOSIS: Osteomyelitis, left great toe.   PROCEDURE: Amputation, left great toe.   SURGEON: Durward Fortes, DPM  ANESTHESIA: Local MAC.   HEMOSTASIS: None.   ESTIMATED BLOOD LOSS: 25 mL.   PATHOLOGY: Left great toe.   LABS: Bone culture, left great toe distal phalanx.   DRAINS: None.   COMPLICATIONS: None apparent.   OPERATIVE INDICATIONS: This is a 54 year old female with diabetes and peripheral vascular disease with recent development of osteomyelitis in her left great toe. The patient is scheduled for lower extremity bypass later on this month and decision is made to amputate the toe prior to remove any infection.   DESCRIPTION OF PROCEDURE: The patient was taken to the operating room and placed on the table in the supine position. Following satisfactory sedation, the left foot was anesthetized with 10 mL of 0.5% Sensorcaine plain around the first metatarsal. The foot was prepped and draped in the usual sterile fashion. Attention was then directed to the left great toe where a fishmouth-type incision was made coursing medial to lateral, at the base of the great toe. The incision was carried sharply down to the level of the bone and dissection carried periosteally back to the base of the metatarsal. Using a pneumatic saw, the proximal phalanx was incised at its base and the distal portion and remainder of the great toe were removed in toto. There appeared to be good healthy bleeding tissues with no significant evidence of any necrosis. The wound was flushed with copious amounts of sterile saline and closed using 5-0 nylon simple interrupted and vertical mattress sutures. Xeroform and a sterile gauze bandage was applied followed by ABD, Kerlix and an Ace wrap. The patient tolerated the procedure and anesthesia  well and was transported to the PAC-U with vital signs stable and in good condition.  ____________________________ Sharlotte Alamo, DPM tc:sb D: 04/08/2013 09:01:59 ET T: 04/08/2013 09:14:56 ET JOB#: [id number]  cc: Sharlotte Alamo, DPM, <Dictator> Blaike Newburn DPM ELECTRONICALLY SIGNED 04/18/2013 10:41

## 2014-10-27 NOTE — Discharge Summary (Signed)
PATIENT NAME:  Laura Reed, Laura Reed MR#:  [id number] DATE OF BIRTH:  03/23/1961  DATE OF ADMISSION:  06/01/2013  DATE OF DISCHARGE:  06/09/2013  PREOPERATIVE DIAGNOSES:  1. Atherosclerotic occlusive disease bilateral lower extremities, with ulceration and osteomyelitis of the left foot.  2. Complication of vascular device, with stricture stenosis of previous femoral endarterectomy site.  3.  Diabetes mellitus.  4.  Coronary artery disease.   PROCEDURES PERFORMED:  1.  Left external iliac to profunda femoris bypass grafting.  2.  Revision of femoral-to-femoral bypass graft, previously-placed left limb.  3.  Profunda femoris to above-knee popliteal bypass grafting with PTFE.   CONSULTATIONS: Internal Medicine. Dr. Caryl Comes, Podiatry.   HISTORY: Ms. Witts is a 54 year old woman who presented with worsening infection and ulceration of the left great toe. Noninvasive workup, including angiography, demonstrates the common femoral is nearly occluded, SFA is occluded. She, therefore, requires revision of the left groin and subsequent fem-pop bypass for limb salvage. Risks and benefits for revision and surgery were reviewed. All questions answered. The patient has agreed to proceed.   HOSPITAL COURSE: On the day of admission, she underwent the above-noted bypass grafting. She did well, and was taken to the Intensive Care Unit. Postoperatively, she was noted to have some low blood pressure. This responded to fluids and IV pressors. Medicine was consulted to assist in managing her diabetes as well as her hypertension. Ultimately, she was weaned off the pressors, but her home medications were put on hold. Dr. Caryl Comes was also asked to see the patient, and subsequently he took her to the Operating Room on postoperative day #2, when he debrided her toe, removed the clinically infected bone. Following this second procedure, she actually did quite well over the course of the next 4 or 5 days, continued to ambulate.  Physical Therapy worked with her. Her blood pressure responded quite nicely. She was tolerating regular diet, and on the 7th hospital day she was felt fit for discharge.   She is discharged to home. She will follow up with me in the office in approximately 10 to 14 days. She will follow up with Dr. Caryl Comes within the week. She will follow up with Dr. Derrel Nip in the next week to 10 days. She is to take an ADA diet. Her medications are reviewed at discharge.   CONDITION ON DISCHARGE: Improved.    ____________________________ Katha Cabal, MD ggs:mr D: 06/10/2013 18:02:47 ET T: 06/10/2013 22:17:52 ET JOB#: [id number]  cc: Katha Cabal, MD, <Dictator> Katha Cabal MD ELECTRONICALLY SIGNED 06/13/2013 17:21

## 2014-10-27 NOTE — Op Note (Signed)
PATIENT NAME:  Laura Reed, Laura Reed MR#:  [id number] DATE OF BIRTH:  1960-08-27  DATE OF PROCEDURE:  06/01/2013  PREOPERATIVE DIAGNOSES: 1.  Peripheral arterial disease with ulceration, left lower extremity.  2.  Status post multiple previous left lower extremity revascularizations.  3.  Coronary artery disease.  4.  Diabetes mellitus.   POSTOPERATIVE DIAGNOSES: 1.  Peripheral arterial disease with ulceration, left lower extremity.  2.  Status post multiple previous left lower extremity revascularizations.  3.  Coronary artery disease.  4.  Diabetes mellitus.   PROCEDURES: 1.  Left external iliac artery to profunda femoris artery bypass.  2.  Revision with reimplantation of femoral to femoral bypass to ileal to profunda bypass.  3.  Left profunda femoris artery to above-knee popliteal artery bypass with 6 mm PTFE.   SURGEON: English as a second language teacher.    ANESTHESIA: General.   ESTIMATED BLOOD LOSS: 350 mL.   INDICATION FOR PROCEDURE: This is a 54 year old female with severe peripheral vascular disease history. She has had multiple previous revascularizations including femoral to femoral bypass and a right femoral to popliteal bypass. She has near occlusive stenosis of the femoral artery down to the profunda femoris, which encompasses the femoral to femoral bypass anastomosis and threatens both legs. Revascularization of this is to be performed. In addition, she has an SFA occlusion on the left and a nonhealing ulceration on the left, and this will need to be addressed as well. She is brought in for extensive revascularization attempt for limb salvage; otherwise, she will likely lose both lower extremities.   DESCRIPTION OF PROCEDURE: The patient is brought to the operative suite, and after an adequate level of general anesthesia is obtained, the left lower extremity is sterilely prepped and draped and a sterile surgical field was created. Dr. Delana Meyer dissected out the groin, which was densely fibrotic  due to her multiple previous surgeries.  I dissected out the above-knee popliteal artery and then joined him to help with the dissection in the groin. The popliteal arteries were encircled with vessel loops proximally and distally. We were eventually able to get out the main profunda branch and encircled this with vessel loops proximally and distally, as well as getting the external iliac artery for control and the femoral to femoral bypass graft. The patient was systemically heparinized. The initial bypass was in left external iliac artery to deep femoral artery bypass with a 6 mm Propaten PTFE graft. A proximal anastomosis was created with a CV-6 suture, as was the distal anastomosis. The vessel was flushed and de-aired prior to releasing control. We then oversewed the stump of the femoral to femoral bypass on the old femoral artery patch and reimplanted the femoral to femoral bypass on the left to the new iliac vein to deep femoral artery bypass. This was done with a CV-6 suture as well. Once flow had been restored there, we clamped the graft below the femoral to femoral bypass to keep flow to the right lower extremity. The distal portion of this bypass onto the profunda femoris artery was then opened. We had previously tunneled a 6 mm PTFE graft from the above-knee popliteal location to the femoral incision. The proximal anastomosis was created in a graft-to-graft fashion with the CV-6 suture, and we flushed through the graft. The popliteal artery was then prepared. An endarterectomy had to be performed on the popliteal artery due to the dense calcific disease at this location. This was done with the elevator and a nice endpoint was created, both  proximally and distally. Anastomosis was created with a running CV-6 suture in the usual fashion, and the vessel was flushed and de-aired prior to release of control. Pressure was held. The wounds were then irrigated. Surgicel and Evicel topical hemostatic agents were  used  and hemostasis was complete. I then closed the popliteal incision with deep layer of 0 Vicryl, 2-0 Vicryl, 3-0 Vicryl and staples Dr. Delana Meyer created a layered closure in the femoral incision and incisional VACs were placed over both wounds. The patient was then awakened from anesthesia and taken to the recovery room in stable condition having tolerated the procedure well.     ____________________________ Algernon Huxley, MD jsd:dmm D: 06/01/2013 19:55:05 ET T: 06/01/2013 20:42:20 ET JOB#: [id number]  cc: Algernon Huxley, MD, <Dictator> Algernon Huxley MD ELECTRONICALLY SIGNED 06/13/2013 9:24

## 2014-10-27 NOTE — Consult Note (Signed)
Brief Consult Note: Diagnosis: 54 yr old female with h/o pvd,DMII,hlp CAds/p cabg,dibatic neuropathy  had left femoral./popliteal bypass.   Patient was seen by consultant.   Recommend further assessment or treatment.   Orders entered.   Comments: 54 yr old female with multiple medical problems of htn,DMII,HLP,CAd s/p cabg with  left leg femoropopliteal bypass and wound vac for left leg ulcer  hypotension;recommned holding ACe,coreg,continue aggressive hydration, 2.cads/p cabg'continue statins. 3.DMII;on clears today,continue SSi with coverage,hold her 70/30  and levemir,restart from tomorrow neuro pathy.  Electronic Signatures: Epifanio Lesches (MD)  (Signed [phone] 17:17)  Authored: Brief Consult Note   Last Updated: 26-Nov-14 17:17 by Epifanio Lesches (MD)

## 2014-10-27 NOTE — Op Note (Signed)
PATIENT NAME:  Laura Reed, Laura Reed MR#:  [id number] DATE OF BIRTH:  [birth year]  DATE OF PROCEDURE:  03/23/2013  PREOPERATIVE DIAGNOSES:  1. Atherosclerotic occlusive disease, bilateral lower extremities, with ulceration of the left foot and a draining wound.  2. Rest pain  left lower extremity.   POSTOPERATIVE DIAGNOSES:  1. Atherosclerotic occlusive disease, bilateral lower extremities, with ulceration of the left foot and a draining wound.  2. Rest pain  left lower extremity.   PROCEDURES PERFORMED: 1. Abdominal aortogram.  2. Left lower extremity distal runoff second-order catheter placement.   SURGEON: Hortencia Pilar, M.D.   SEDATION: Versed 4 mg plus fentanyl 100 mcg administered IV. Continuous ECG, pulse oximetry and cardiopulmonary monitoring was performed throughout the entire procedure by the interventional radiology nurse. Total sedation time was one hour.   ACCESS: Left radial artery 5 French sheath.   FLUOROSCOPY TIME: 2.5 minutes.   CONTRAST USED: Isovue 65 mL.   INDICATIONS: Ms. Lapp is a 54 year old woman with multiple medical problems and known atherosclerotic occlusive disease. She has had multiple interventions in the past and currently is status post a fem-fem bypass with a right femoral to below-knee popliteal bypass as well. On noninvasive studies and physical examination she has been found to have significant stenosis within the common femoral, which threatens not just her left lower extremity but also the fem-fem bypass and, therefore, the right lower extremity. The risks and benefits for angiography were reviewed. All questions answered. The patient has agreed to proceed.   DESCRIPTION OF PROCEDURE: The patient is taken to special procedures and placed in the supine position. After adequate sedation is achieved, her left arm is extended palm upward. The left arm is prepped and draped in sterile fashion. Ultrasound is placed in a sterile sleeve. Radial artery is  identified. It is echolucent and pulsatile indicating patency. Image is recorded for the permanent record. Under direct visualization after 1% lidocaine has been infiltrated, the micropuncture needle is used to access the radial artery, microwire followed micro sheath, J-wire followed by a 5 French sheath.   The pigtail catheter and a Magic torque wire then negotiated into the arch of the aorta and then down the descending aorta and to the abdominal aorta. The pigtail catheter is positioned at the level of T12 and AP projection of the aorta is obtained. A stiff angled Glidewire and 150 straight catheter is then exchanged for the pigtail catheter, and the catheter is negotiated first into the common iliac artery and then distally into the external iliac artery and distal runoff is obtained. After review of the images, the catheter is pulled and manual pressure is held on the radial artery puncture site. There are no immediate complications.   INTERPRETATION: The abdominal aorta is opacified with a bolus injection of contrast. It demonstrates diffuse disease, but there are no hemodynamically significant stenoses. The left common iliac and external iliac artery are widely patent. The common femoral artery on the left demonstrates a string sign which involves the anastomosis of the femoral-femoral graft. The femoral-femoral graft is otherwise free of problems and fills the profunda femoris and the fem below the knee popliteal bypass on the right. Both the fem-fem anastomosis on the right as well as the distal anastomosis for the bypass are widely patent.   The profunda femoris is reconstituted, superficial femoral artery is occluded throughout its course and the above-knee popliteal is reconstituted. There appears to be two vessel runoff to the foot.   SUMMARY: A critical stenosis  of the left common femoral which is not only rendering the left lower extremity ischemic, it is also threatening the patency of the  bypass. There appears to be patency of the external iliac down to the circumflex vessels, as well as reconstitution of the profunda femoris. There is also reconstitution of the above-knee popliteal.   The patient will require surgical revision, this is not amenable to intervention. External iliac to profunda femoris bypass with revision of the fem-fem and then femoral to above-knee popliteal bypass will be recommended.   ____________________________ Katha Cabal, MD ggs:sg D: 03/23/2013 11:10:13 ET T: 03/23/2013 11:28:53 ET JOB#: [id number]  cc: Katha Cabal, MD, <Dictator> Deborra Medina, MD Javier Docker. Ubaldo Glassing, MD  Katha Cabal MD ELECTRONICALLY SIGNED 04/07/2013 8:39

## 2014-10-27 NOTE — Op Note (Signed)
PATIENT NAME:  Laura Reed, WALKO MR#:  [id number] DATE OF BIRTH:  August 09, 1960  DATE OF PROCEDURE:  06/01/2013  PREOPERATIVE DIAGNOSIS: Atherosclerotic occlusive disease, bilateral lower extremities, with ulceration of the left great toe.   POSTOPERATIVE DIAGNOSIS: Atherosclerotic occlusive disease, bilateral lower extremities, with ulceration of the left great toe.   PROCEDURE PERFORMED:  1. Left external iliac to profunda femoris bypass grafting using 6 mm ringed PTFE.  2. Revision of left femoral anastomosis, femoral-femoral bypass.  3. Left femoral to above-knee popliteal bypass grafting using 6 mm ringed PTFE.   PROCEDURE  PERFORMED BY: Dr. Delana Meyer and Dr. Lucky Cowboy, co- surgeons.   ANESTHESIA: General by endotracheal intubation.   FLUIDS: Per anesthesia record.   ESTIMATED BLOOD LOSS: 350 mL.   SPECIMEN: None.   INDICATIONS: [name] is a 54 year old woman with multiple medical problems who has been followed for ulceration and infection of her left great toe. She now has osteomyelitis and will require further surgical revisions. She has known atherosclerotic occlusive disease, and her clinical as well as noninvasive parameters suggest nonhealing. She is therefore undergoing revascularization for limb salvage. Risks and benefits were reviewed. All questions answered. The patient agrees to proceed.   DESCRIPTION OF PROCEDURE: The patient is taken to the Operating Room and placed in the supine position. After adequate general anesthesia is induced and appropriate invasive monitors are placed, she is positioned supine and she was subsequently prepped from the nipple line to the knees on the right, the left leg is also prepped circumferentially.   A linear incision is created through the old scar in the left groin and extended more proximally onto the abdomen and the dissection is carried down through the scar tissue to identify the abdominal wall fascia. Dissection is then carried more distally  at a level slightly below the previous dissection. The superficial femoral artery is identified as well. Having now achieved landmarks, the fascia and the ilioinguinal ligament are opened in a vertical fashion and the external iliac artery is dissected circumferentially and looped with Silastic vessel loops. The previous femoral-femoral anastomosis on the left, as well as the common femoral artery is now identified and dissected essentially circumferentially. Several centimeters of the fem-fem graft are dissected to allow for repeat revision of the anastomosis. Subsequently, the profunda femoris is identified and this is dissected circumferentially and looped with Silastic vessel loops proximally and distally.   With Dr. Lucky Cowboy working simultaneously, the above-knee popliteal is exposed through a medial  incision and looped with Silastic vessel. Tunneling device is then used to create a subsartorial pathway, and a 6 mm ringed PTFE graft is pulled through the tunnel. Heparin 5000 units is given.   The femoral-femoral graft, the external iliac and the common femoral are then clamped. The anastomosis is taken down and the portion attached to the femoral artery is then ligated closed using running CV-4 Gore-Tex. The graft itself is irrigated with 60 mL of heparinized saline. A segment of 6 mm ringed PTFE is then cut to proper length. The external iliac is opened. Stay sutures of 6-0 Prolene are placed, and an end graft to side external iliac artery anastomosis is fashioned with running CV-6 suture. The profunda femoris is then opened, extended with Potts scissors. A 6-0 Prolene stay suture is placed and subsequently the distal anastomosis created in an end graft to side profunda femoris fashion. The fem-fem is then approximated to the graft. The graft is opened and and end femoral-femoral graft to side iliofemoral graft  anastomosis is fashioned with running CV-6 suture. Flushing maneuvers are performed and flow is then  established to the fem-fem and the graft is clamped just distal to the femoral anastomosis.   The segment of ringed PTFE for the fem-pop is then beveled. The ileo to femoral graft is opened just above the distal anastomosis and and end fem-pop graft to side iliofemoral graft anastomosis is fashioned with running CV-6 suture. The graft is then checked for length, The popliteal artery is opened, segment is endarterectomized and, subsequently, the distal anastomosis created in end graft to side popliteal artery fashion. Flushing maneuvers are performed and flow is established now to both the profunda femoris as well as the proximal popliteal. All incisions are inspected for hemostasis. Evicyl and Surgicel are placed after the wounds are irrigated, and, subsequently, all wounds are closed in layers. The groin wound is closed using running 0 Vicryl for the first layer. The fascia and the ilioinguinal ligament is closed with interrupted 0 Vicryl suture. Several layers of 2 - 0 and then 3-0 suture are then utilized. Ultimately, skin is closed with staples. In a similar fashion, multiple layers of Vicryl are used to close the above-knee popliteal incision and staples. Incisional VAC is placed as dressing. The patient tolerated the procedure well. There were no immediate complications. Sponge and needle counts are correct and she is taken to the recovery area in excellent condition.    ____________________________ Katha Cabal, MD ggs:sg D: 06/02/2013 11:33:19 ET T: 06/02/2013 11:58:38 ET JOB#: [id number]  cc: Katha Cabal, MD, <Dictator> Javier Docker. Ubaldo Glassing, MD Deborra Medina, MD  Katha Cabal MD ELECTRONICALLY SIGNED 06/13/2013 17:21

## 2014-10-27 NOTE — Consult Note (Signed)
PATIENT NAME:  Laura Reed, Laura Reed MR#:  [id number] DATE OF BIRTH:  1961-04-25  DATE OF CONSULTATION:  06/01/2013  CONSULTING PHYSICIAN:  Epifanio Lesches, MD  PRIMARY PHYSICIAN: Dr. Derrel Nip   CONSULT REQUESTING PHYSICIAN:  Dr. Delana Meyer  REASON FOR CONSULTATION: Medical management of diabetes and hypertension.  HISTORY OF PRESENT ILLNESS: A 54 year old female patient with peripheral vascular disease, had a left femoropopliteal bypass and left external iliac to deep femoral bypass surgery. The patient is being admitted to ICU for close monitoring. I was asked to see her for medical management. The patient was seen in PACU. Complains of pain in the left leg and also feeling thirsty. Denies any chest pain. No trouble breathing. No cough. No fever. The patient is hypotensive, blood pressure is around 92/61, preop blood pressure is 90/44. She received 2 liters of normal saline, and estimated blood loss was around 350 mL during surgery, and she had 1 liter of output from Foley. The patient right now is hemodynamically stable, and says that her blood pressure always runs low.   PAST MEDICAL HISTORY: Significant for history of hypertension, hyperlipidemia, coronary artery disease, diabetes, history of peripheral vascular disease, also carotid artery stenosis.   FAMILY HISTORY:  Significant for lung cancer in mother and diabetes in grandmother.   SOCIAL HISTORY: Quit smoking 2 years ago, heavy smoker before. History of alcohol abuse before, now quit.   SURGICAL HISTORY: Significant for cholecystectomy, history of coronary artery disease with bypass x 3, cesarean section, also history of right leg bypass before.   ALLERGIES: No known allergies.   MEDICATIONS:  Aspirin 81 mg daily, atorvastatin 40 mg half tablet daily, Coreg 6.25 mg p.o. b.i.d., Plavix 75 mg p.o. daily, digoxin 0.125 mg p.o. at bedtime, doxycycline 100 mg p.o. b.i.d., Neurontin 600 mg p.o. t.i.d., insulin 70/30, 27 units in the morning, 27  units in the evening, Levemir 30 units at bedtime. She is also on Xanax occasionally, 0.5 mg p.o. b.i.d., lisinopril 5 mg p.o. daily, vitamin D3, 2000 units once a day.   REVIEW OF SYSTEMS: CONSTITUTIONAL: No fever. No fatigue. Is thirsty.  EYES:  No blurred vision.  ENT: No tinnitus. No ear pain. No epistaxis. No difficulty swallowing.  RESPIRATORY: No cough. No wheezing.  CARDIOVASCULAR: No chest pain. No orthopnea.  GASTROINTESTINAL: No nausea. No vomiting.  INTEGUMENT: Has no skin rashes.  MUSCULOSKELETAL: Has peripheral vascular disease and fem-pop bypass on left side today.  NEUROLOGIC: No numbness. The patient has a history of neuropathy before.  PSYCHIATRIC: Has anxiety on and off.   VITAL SIGNS: Temperature 36.2 centigrade, blood pressure is 110/60 right now, heart rate is 70s, sats 100%, she is on 2 liters.   PHYSICAL EXAMINATION:  GENERAL: She is alert, awake, oriented, not in distress. Answering questions appropriately.  HEENT: Head atraumatic, normocephalic. Pupils equally reacting to light. No conjunctival icterus. Hearing is intact. Tympanic membranes are clear.  NECK: No thyroid enlargement. No JVD. No carotid bruit. No lymphadenopathy.  RESPIRATORY: Clear to auscultation. No wheeze. No rales. Not using accessory muscles of respiration.  CARDIOVASCULAR: S1, S2. Regular. No murmurs.  ABDOMEN: Soft, nontender, nondistended. Bowel sounds present. No organomegaly. No hernias.   EXTREMITIES: The patient's upper extremities have radial pulses bilaterally, 2+ in the lower extremities. Bilateral dorsalis pedis not palpable, and also bilateral tibial pulse not palpable. The patient is status post surgery.  NEUROLOGIC: The patient's cranial nerves II through XII intact. Power 5/5 in upper and lower extremities. Sensation intact. DTRs 2+ bilaterally.  LABS:  No recent labs, except the blood glucose this morning.  ASSESSMENT AND PLAN:  1. The patient is a 54 year old female with  peripheral vascular disease, status post femoropopliteal bypass on the left side, and with wound VAC on. The patient to going to go to ICU for close monitoring of the vitals. The patient had a nonhealing ulcer of the foot, for which he was taking doxycycline. At this time, monitor her closely for temperature. She is on IV fluids. Continue that. Because of hypotension, hold Coreg/lisinopril today, and monitor in ICU. Continue aggressive hydration, and continue pain medication and antibiotics with cefazolin.   2.  Diabetes mellitus type 2. Right now she is n.p.o., clear liquids are ordered from today, and tomorrow is regular diet. I will do fingersticks with coverage and restart the NovoLog 70/30 tomorrow.  3.  (h/o cad s/p > coronary artery bypass grafting. The patient's blood pressure is on the borderline side, so recommend holding the Coreg/lisinopril today, and can restart tomorrow.   Will follow up with you. Thank you for asking [place] to see this patient.   ____________________________ Epifanio Lesches, MD sk:mr D: 06/01/2013 17:09:53 ET T: 06/01/2013 19:29:26 ET JOB#: [id number]  cc: Epifanio Lesches, MD, <Dictator> Epifanio Lesches MD ELECTRONICALLY SIGNED 06/15/2013 22:54

## 2014-10-28 NOTE — Consult Note (Signed)
PATIENT NAME:  Laura Reed, Laura Reed MR#:  [id number] DATE OF BIRTH:  05-22-1961  DATE OF CONSULTATION:  08/15/2013  REFERRING PHYSICIAN:  Sharlotte Alamo, DPM CONSULTING PHYSICIAN:  Epifanio Lesches, MD  PRIMARY DOCTOR:  Deborra Medina, MD  REASON FOR CONSULTATION:  Preop clearance.    HISTORY OF PRESENT ILLNESS: A 54 year old female patient with history of hypertension, diabetes, peripheral vascular disease, coronary artery disease, hyperlipidemia, came in because of drainage from her left third toe which is going on for about a month. The patient has been getting antibiotics for about 3 months. The patient had noticed redness yesterday of the left third toe. She has been admitted to podiatry service and Dr. Cleda Mccreedy is planning on amputation of the third toe with partial resection. We were asked to see her for preop clearance. The patient denies any chest pain. No trouble breathing. No cough. No fever.   PAST MEDICAL HISTORY:  The patient has a past medical history of coronary artery disease with CABG, sees Dr. Ubaldo Glassing for that; hypertension, hyperlipidemia, diabetes, diabetic neuropathy, peripheral vascular disease. The patient had multiple vascular procedures for the leg and recently had percutaneous transluminal angioplasty of the distal SFA and popliteal by Dr. Delana Meyer and she has multiple nonhealing ulcers of the left foot. The patient's surgical history also includes history of bypass surgery, cesarean sections, cholecystectomy and right iliac artery stent and history of because of bypass as I mentioned, angioplasty left common femoral artery in 2011, cholecystectomy.   ALLERGIES: No known allergies.   FAMILY HISTORY: Unremarkable.   SOCIAL HISTORY: Tobacco abuse before, now quit. No alcohol. No drugs. Lives alone.   MEDICATIONS: Takes:  1.  Percocet 5/325 every 4 hours as needed for pain.  2.  Augmentin 875 mg p.o. b.i.d. started on December 4th.  3.  Aspirin 81 mg daily.  4.  Atorvastatin 40 mg  1/2 tablet once a day.  5.  The patient is not taking Coreg since last discharge, that was in December, December 4th. The patient says that she has not taken any BP medications since then and her blood pressure is always in the 120s, not needing any medications.  6.  The patient is also on digoxin 125 mcg p.o. daily.  7.  Plavix 70 mg p.o. daily.  8.  Doxycycline 100 mg p.o. b.i.d.  9.  She is on Levemir 30 units at bedtime.  10.  She is on insulin aspart 70/30, 24 units in the morning, 27 units in the evening.  11.  Neurontin 600 mg p.o. t.i.d.  12.  Vitamin D3 2000 units once a day.  13.  Xanax 0.5 mg p.o. b.i.d.   REVIEW OF SYSTEMS: CONSTITUTIONAL: Has no fever. No fatigue.  HEENT:  No ear pain. Does have some sinus problems. No throat pain.  GASTROINTESTINAL: Has no abdominal pain. No nausea. No vomiting.  GENITOURINARY: No dysuria.  CARDIOVASCULAR: No chest pain.  PULMONARY: No trouble breathing. No wheezing. No COPD.   NEUROLOGIC: No history of strokes or TIAs. MUSCULOSKELETAL: Has been having issues with nonhealing ulcers. Had multiple angiographic procedures to the legs and right now dealing with nonhealing ulcer of the third toe on the left.  PSYCHIATRIC: No anxiety or insomnia.   PHYSICAL EXAMINATION: VITAL SIGNS: Temperature 97.7, heart rate 78, blood pressure 128/57, sats 97% on room air.  GENERAL: The patient is alert, awake and oriented 54 year old female answering questions appropriately, not in distress.  HEENT: Head normocephalic, atraumatic. Pupils equally reacting to light. Extraocular movements  are intact. ENT:  No tympanic membrane congestion. No turbinate hypertrophy. No oropharyngeal erythema.  NECK: Supple. No JVD. No carotid bruit.  LUNGS: Clear to auscultation. No wheeze. No rales.  CARDIOVASCULAR: S1, S2 regular. The patient has no murmurs. Pulses are diminished in distal dorsalis and tibial pulses. The patient has intact capillary refilling. GASTROINTESTINAL:  Abdomen is soft, nontender, nondistended. Bowel sounds present. SKIN: Warm and dry, has erythema and edema of the left third toe. The patient's erythema is going to the forefoot.  PSYCHIATRIC: Normal.   NEUROLOGICAL: Cranial nerves II through XII intact. Power 5/5 upper extremities and lower extremities. Sensations are intact. DTRs 2+ bilaterally.  MUSCULOSKELETAL: Digital contractures on the left foot. The patient has drainage from the left foot with redness.   LABORATORY, DIAGNOSTIC AND RADIOLOGICAL DATA:   1.  Electrolytes:  Sodium 137, potassium 4.2, chloride 105, bicarb 28, BUN 26, creatinine 0.75, glucose 208.  2.  X-ray of the left foot shows osteomyelitis of first metatarsal stump, distal second metatarsal shaft and third metatarsophalangeal joint.  3.  WBC 10.6, hemoglobin 12.8, hematocrit 38.2, platelets 298, BG 173.4.  EKG: Sinus rhythm with PVCs, 72 beats per minute. No ST-T changes.  5.  Chest x-ray, unofficial, looks  like no infiltrate, no CHF, but official results are pending.   ASSESSMENT AND PLAN:   1.  The patient is a 54 year old female with osteomyelitis of the left third toe with cellulitis. The patient is admitted to the podiatry service on IV antibiotics, scheduled to have toe amputation.  The patient is medically cleared and moderate risk for surgery because of her hypertension, coronary artery disease and diabetes but it can be performed. The patient needs IV antibiotics and also continue incentive spirometry after surgery.   2.  For diabetes, she is on Levemir 30 units at bedtime. That can be started if she eats dinner after surgery today. Otherwise, continue like a basal rate at 15 units at bedtime till she eats well and can continue sliding scale with coverage for diabetes.  3.  History of coronary artery disease and also peripheral vascular disease.  She is on aspirin and Plavix. That can be continued if okay with surgery, that means appropriate with podiatry.  4.  For  rapid atrial fibrillation, continue digoxin 1.25 mcg p.o. daily.  5.  Hypertension. She has borderline blood pressure so hold the Coreg at this time and watch her blood pressure.   TIME SPENT: About 60 minutes. We will discuss  with Dr. Cleda Mccreedy regarding clearance.    ____________________________ Epifanio Lesches, MD sk:cs D: 08/15/2013 18:03:00 ET T: 08/15/2013 19:31:02 ET JOB#: [id number]  cc: Epifanio Lesches, MD, <Dictator> Epifanio Lesches MD ELECTRONICALLY SIGNED 08/16/2013 18:46

## 2014-10-28 NOTE — H&P (Signed)
PATIENT NAME:  Laura Reed, Laura Reed MR#:  [id number] DATE OF BIRTH:  12-09-1960  DATE OF ADMISSION:  08/15/2013   HISTORY OF PRESENT ILLNESS: This is a 54 year old female with diabetes and associated neuropathy with significant peripheral vascular disease, who has had chronic ulcerations on her feet. She has undergone multiple amputations. Currently has had an open sore from a fracture of her 3rd toe that has been present for the last month or so. Currently has had some increased cellulitis and drainage and evidence for osteomyelitis in the 3rd toe. The patient will be admitted for amputation and partial ray resection of the 3rd toe, as well as antibiotic coverage.   PAST MEDICAL HISTORY: 1.  Diabetes with associated neuropathy.  2.  Peripheral vascular disease.  3.  Coronary artery disease status post CABG.  4.  Hyperlipidemia.  5.  Hypertension.   PAST SURGICAL HISTORY:   1.  CABG, 2010.  2.  Right femoral popliteal artery bypass, 2009.  3.  Right iliac artery stent, 2009.  4.  Angioplasty, left common femoral artery, 2011.  5.  Pilonidal cyst surgery, 2011.  6.  Multiple leg catheterizations. 7.  Two C-sections.  8.  Cholecystectomy.   MEDICATIONS:  Aspirin 81 mg daily, Coreg 6.25 mg b.i.d., digoxin 1.25 mcg daily, Lipitor 40 mg 1 p.o. daily, Plavix 75 mg 1 p.o. daily, lisinopril 5 mg 1 p.o. daily, NovoLog 70/30 FlexPen 15 units subcutaneously twice daily, Levemir FlexPen 100 units/mL, 30 units subcutaneous at bedtime; gabapentin 600 mg, Augmentin 875/125 mg b.i.d.   ALLERGIES: No known drug allergies.   FAMILY HISTORY: Unremarkable.   SOCIAL HISTORY: History of smoking abuse. Does not currently smoke. Denies alcohol use. Lives alone.   REVIEW OF SYSTEMS:  CONSTITUTIONAL:  Some increased redness and drainage from her left foot. She denies any fever or chills. Denies any nausea, vomiting or stomach pain. No shortness of breath. Denies any chest pains. Some visual changes with macular  degeneration. No hearing loss. Denies any weight loss or weight gain. No generalized fatigue.  VASCULAR:  DP and PT pulses are diminished but palpable. Capillary filling time intact to the remaining toes.  NEUROLOGICAL: Loss of protective threshold distally in the foot and toes.  INTEGUMENT: Skin is warm, dry and mildly atrophic. Significant erythema and edema at the base of the left 3rd toe with some extending mildly onto the forefoot. Full-thickness ulceration plantarly, probing directly to bone. There is a full-thickness wound on the left 1st metatarsal area medially from recent debridement of bone that is granulating in with no signs of infection.  MUSCULOSKELETAL: Digital contractures on the left foot. Has had recent fracture in the 3rd toe.   IMPRESSION: 1.  Diabetes with neuropathy.  2.  Peripheral vascular disease.  3.  Osteomyelitis, left 3rd toe, with cellulitis of the foot.   PLAN: The patient will be admitted for IV antibiotics and amputation with partial ray resection of the left 4th toe. We will consult [facility] for history and physical and medical clearance, as well as daily medical management. We will plan for surgery later on this afternoon to remove the infected toe.     ____________________________ Sharlotte Alamo, DPM tc:dmm D: 08/15/2013 11:47:00 ET T: 08/15/2013 12:03:53 ET JOB#: [id number]  cc: Sharlotte Alamo, DPM, <Dictator> Malia Corsi DPM ELECTRONICALLY SIGNED 08/30/2013 9:58

## 2014-10-28 NOTE — Discharge Summary (Signed)
PATIENT NAME:  Laura Reed, Laura Reed MR#:  [id number] DATE OF BIRTH:  Jun 13, 1961  DATE OF ADMISSION:  08/15/2013 DATE OF DISCHARGE:  08/17/2013   PRINCIPAL DIAGNOSIS: Osteomyelitis, left third toe.   SECONDARY DIAGNOSES:  1. Peripheral vascular disease.  2. Insulin-requiring diabetes with neuropathy.  3. Cellulitis, left foot.   PROCEDURES: Amputation with partial ray resection, left third toe.   CONSULTATIONS: [facility], internal medicine, for daily medical management.   HOSPITAL COURSE: This is a 54 year old female who was admitted on 08/15/2013 with obvious osteomyelitis and cellulitis in her left third toe. The patient was found to have gas in the soft tissues on the dorsal aspect of the third toe. She was stabilized after admission and cleared for surgery, which was performed on 08/15/2013. The patient underwent uneventful amputation of the third toe with partial resection of the third metatarsal. Postoperative course was uneventful, with vital signs stable, afebrile. Moderate bleeding from the bandage was noted on her first bandage change, but no purulence. Previous wound on the first metatarsal was stable, with some antibiotic-impregnated beads that were also implanted there. Followup lab work revealed a normal white count and CBC prior to discharge. The patient stabilized for discharge on February 11 with home health care to evaluate and manage her wound.   DISCHARGE INSTRUCTIONS:  1. Home health care for saline gauze to the first metatarsal wound and dry gauze to the fresh amputation site of the third toe every other day.  2. Augmentin 875 one p.o. b.i.d.  3. The patient may be weight-bearing on the left foot using a surgical shoe, pressure only on the heel.  4. Followup appointment in 1 week outpatient.   CONDITION: The patient discharged in stable and satisfactory condition.   ____________________________ Sharlotte Alamo, DPM tc:lb D: 08/17/2013 07:53:55 ET T: 08/17/2013 08:12:01  ET JOB#: [id number]  cc: Sharlotte Alamo, DPM, <Dictator> Shariq Puig DPM ELECTRONICALLY SIGNED 08/30/2013 9:59

## 2014-10-28 NOTE — Consult Note (Signed)
Brief Consult Note: Diagnosis: preop clearance for  toe amputation.   Patient was seen by consultant.   Consult note dictated.   Recommend to proceed with surgery or procedure.   Orders entered.   Discussed with Attending MD.   Comments: 54 yr old  famel with PAD with left 3rd toe osteomyelitis/for toe amputation by podiatry tonight stat consult for preopp clearance pt with htn,DMII,cad s/p cabg,VD is moderate risk for planned surgery,continue asa,statins,post op Spirometry, htn,paroxysmal afib;EKG showed NSR with PVC,on dig continue,but pt not taking coreg since dec DMII;npo for surgery,lantus at basal rate while NPO spoke with dr.kline.  Electronic Signatures: Epifanio Lesches (MD)  (Signed 09-Feb-15 18:36)  Authored: Brief Consult Note   Last Updated: 09-Feb-15 18:36 by Epifanio Lesches (MD)

## 2014-10-28 NOTE — Op Note (Signed)
PATIENT NAME:  Laura Reed, Laura Reed MR#:  [id number] DATE OF BIRTH:  07/02/1961  DATE OF PROCEDURE:  08/02/2013  PREOPERATIVE DIAGNOSIS: Atherosclerotic occlusive disease, bilateral lower extremities, with nonhealing ulceration of the left foot.   POSTOPERATIVE DIAGNOSIS: Atherosclerotic occlusive disease, bilateral lower extremities, with nonhealing ulceration of the left foot.   PROCEDURES PERFORMED:  1.  Contrast injection, left lower extremity, second-order catheter placement.  2.  Percutaneous transluminal angioplasty of the distal SFA and popliteal, left lower extremity.   SURGEON: Katha Cabal, M.D.   SEDATION: Versed 4 mg plus fentanyl 150 mcg administered IV. Continuous ECG, pulse oximetry and cardiopulmonary monitoring is performed throughout the entire procedure by the interventional radiology nurse. Total sedation time is 1 hour.   ACCESS: A 6-French sheath, left femoral to popliteal bypass graft, antegrade direction.   CONTRAST USED: Isovue 50 mL.   FLUOROSCOPY TIME: Was 4.8 minutes.   INDICATIONS: Laura Reed is a 54 year old woman who presents with nonhealing of her left foot. Physical examination as well as noninvasive studies suggest a more mid popliteal lesion. Her recently placed femoral popliteal bypass is still patent. She is therefore undergoing angiography with the hope for intervention for limb salvage. The risks and benefits are reviewed. All questions are answered. The patient agrees to proceed.   DESCRIPTION OF PROCEDURE: The patient was taken to special procedures and placed in the supine position. After adequate sedation is achieved, her left groin and thigh are prepped and draped in sterile fashion. Lidocaine 1% is infiltrated in the soft tissues. Ultrasound is placed in a sterile sleeve. Ultrasound is utilized secondary to lack of appropriate landmarks and to avoid vascular injury. The PTFE fempop bypass is identified and 1% lidocaine is infiltrated in the soft  tissues under ultrasound guidance and subsequently a micropuncture needle is used to access the graft in an antegrade direction. Microwire followed by microsheath, J-wire followed by a 6-French sheath.   Hand injection of contrast is then utilized through the sheath to demonstrate the bypass graft and the distal runoff. Occlusion of the mid popliteal is noted. There is reconstitution of the distal popliteal with 2-vessel runoff to the foot via the anterior tibial and the peroneal. Heparin 4000 units is given. KMP catheter and Glidewire are negotiated through the occlusion and the KMP catheter is positioned in the tibioperoneal trunk, and hand injection of contrast is utilized to demonstrate intraluminal placement and distal runoff.   Subsequently, a Magic torque wire is introduced and a 5 x 10 Lutonix balloon is used to angioplasty from the distal popliteal more proximally. A 6 x 6 Lutonix is then used to angioplasty the actual anastomosis and segment of the popliteal at that level extending from the last centimeter or so of the fempop graft down into the popliteal.   Both Lutonix inflations are to 10 atmospheres for 2-1/2 minutes. Follow-up angiography demonstrates there is rapid flow of contrast through the bypass, rapid flow of contrast through the popliteal now and there is preservation of the distal runoff with rapid filling of the anterior tibial. There is a non-flow-limiting dissection noted in the mid popliteal where the stenosis appeared to be the most calcified. However, this is quite minimal and there is less than 15%  residual narrowing at this level.   A Mynx device is then advanced through the sheath. The sheath is removed and the Mynx device deployed with success. There are no immediate complications.   INTERPRETATION: Femoropopliteal is patent as is the anastomosis which is widely  patent. There is preservation of the femoropopliteal by refluxing blood flow into the superficial femoral  artery headed more proximally with extensive collaterals. Approximately a centimeter or two below the anastomosis, there is occlusion of the popliteal over a distance of approximately 2 to 3 cm and then there is a critical narrowing of the mid popliteal with a heavily calcified area. Distal popliteal is reconstituted and  the anterior tibial is patent from its origin down to the foot filling the pedal arch. Peroneal is also patent but is somewhat smaller and less robust.   Following angioplasty to 5 mm in the distal half of the popliteal and 6 mm at the level of the anastomosis, there is wide patency. Non-flow-limiting small dissection is noted.   SUMMARY: Successful salvage of left lower extremity femoropopliteal with treatment of occlusive lesion distal to the bypass.   ____________________________ Katha Cabal, MD ggs:np D: 08/02/2013 16:04:27 ET T: 08/02/2013 16:27:00 ET JOB#: [id number]  cc: Katha Cabal, MD, <Dictator> Gerrit Heck. Troxler, DPM Deborra Medina, MD Katha Cabal MD ELECTRONICALLY SIGNED 08/18/2013 17:09

## 2014-10-28 NOTE — Op Note (Signed)
PATIENT NAME:  Laura Reed, Laura Reed MR#:  [id number] DATE OF BIRTH:  11-08-1960  DATE OF PROCEDURE:  01/18/2014  PREOPERATIVE DIAGNOSIS:  Critical stenosis of the right internal carotid artery.   POSTOPERATIVE DIAGNOSIS:  Critical stenosis of the right internal carotid artery.    PROCEDURES PERFORMED: 1. Right carotid endarterectomy with repair using xenograft patch.  2. Repair of arterial defect using CorMatrix xenograft patch.   PROCEDURE PERFORMED BY:  Dr. Hortencia Pilar  FIRST ASSISTANT:  Dr. Marta Lamas  ANESTHESIA: General by endotracheal intubation.   FLUIDS: Per anesthesia record.   ESTIMATED BLOOD LOSS: 100 mL.   SPECIMEN: Plaque to pathology for permanent section.   INDICATIONS: This patient is a 54 year old woman with multiple medical problems who presented to the office for routine evaluation of her carotid arteries. She was found to have developed a critical stenosis of the right internal carotid artery. CT angiography was obtained, which confirmed greater than 90% stenosis. She has elected to undergo carotid endarterectomy to prevent stroke. The risks and benefits, as well as alternative therapies have been reviewed. All questions have been answered. Alternative therapies were also discussed. The patient has agreed to proceed.   DESCRIPTION OF PROCEDURE: The patient is taken to the operating room and placed in the supine position. After adequate sedation has been achieved, I am asked to place the A-line after anesthesia has had some difficulty. Ultrasound is placed in a sterile sleeve. Ultrasound is utilized secondary to lack of appropriate landmarks and to avoid vascular injury. Under real-time visualization, the radial artery is identified. It is echolucent and pulsatile indicating patency. Image is recorded for the permanent record. Under real-time visualization, a micropuncture needle is inserted into the radial artery and microwire followed by micro sheath. Arterial line is then  hooked up to the microsheath and an excellent waveform is obtained and the catheter is secured to the wrist with a sterile dressing.    The patient is then induced and general anesthesia is achieved. She is then positioned with her neck slightly extended and rotated to the left. The neck and chest wall are then prepped and draped in sterile fashion. Appropriate timeout is called.   A curvilinear incision is then created along the anterior margin of the sternocleidomastoid muscle and carried down through the soft tissues transecting the platysma. External jugular vein was also ligated and divided between silk sutures. The sternocleidomastoid muscle has been identified and reflected posterolaterally.  Omohyoid muscle is then identified and the jugular vein and common carotid artery are identified at this level. Anterior dissection along the carotid artery is then performed in a cranial direction.  The facial vein is encountered and this is ligated and divided between 2-0 silk.  Carotid bifurcation is then visualized.  External carotid arteries, as well as the superior thyroid artery, are looped with Silastic vessel loops. Dissection is then carried along the anterior margin of the internal carotid artery to a position above visible plaque formation. Circumferential dissection is carried out at this level as well as on the common carotid.  Seven thousand units of heparin is given and allowed to circulate.   The common carotid, followed by the external, followed by the internal carotid artery, is then clamped. Arteriotomy is made and extended with Potts scissors and an indwelling Sundt shunt is placed without difficulty. Flow was re-established to the brain.   The endarterectomy is then performed under direct visualization for the common bulb and internal carotid artery. External carotid artery is treated with  the eversion technique. Four interrupted 7-0 Prolene sutures are used to tack the distal intimal edge.  CorMatrix patches opened onto the field and are rehydrated. It is then trimmed and applied to repair the arterial defect using 6-0 Prolene in a 4-quadrant technique.  Sundt shunt is removed and the suture line is completed after copious irrigation. The flow was then re-established 1st to the external carotid artery and then the internal carotid artery to prevent distal embolization. The suture line is then inspected. Two spots appeared to have some oozing and were controlled with interrupted 6-0 Prolene sutures. The rest of the wound bed is inspected for hemostasis, and then, Surgicel and Evicel are placed in the wound. This is after the wound has been irrigated with sterile saline.   The platysma is then reapproximated with running 3-0 Vicryl and the skin is closed with 4-0 Monocryl subcuticular and Dermabond. The patient tolerated the procedure well. She was awakened in the operating room, moving all extremities, and subsequently transferred to the recovery room in stable condition.    ____________________________ Katha Cabal, MD ggs:dd D: 01/18/2014 19:05:53 ET T: 01/19/2014 01:57:16 ET JOB#: [id number]  cc: Katha Cabal, MD, <Dictator> Deborra Medina, MD Katha Cabal MD ELECTRONICALLY SIGNED 01/31/2014 12:22

## 2014-10-28 NOTE — Op Note (Signed)
PATIENT NAME:  Laura Reed, Laura Reed MR#:  [id number] DATE OF BIRTH:  1960-12-05  DATE OF PROCEDURE:  08/15/2013  PREOPERATIVE DIAGNOSIS: Osteomyelitis, left third toe.   POSTOPERATIVE DIAGNOSIS: Osteomyelitis, left third toe.   PROCEDURE: Amputation left third toe with partial third ray resection.   SURGEON: Sharlotte Alamo, DPM.   ANESTHESIA: Local MAC.   HEMOSTASIS: None.   ESTIMATED BLOOD LOSS: 50 mL   IMPLANTS: Rapid cure vancomycin-impregnated antibiotic beads.   DRAINS: None.   COMPLICATIONS: None apparent.   PATHOLOGY: Left third toe and third metatarsal head.   OPERATIVE INDICATIONS: This is a 54 year old female with history of diabetes and peripheral vascular disease, who has undergone multiple amputations. Recent development of full thickness ulceration beneath the third metatarsal from a fracture in the third toe which then progressed to osteomyelitis in the third toe. Decision made for admission and amputation.   OPERATIVE PROCEDURE: The patient was taken to the operating room and placed on the table in the supine position. Following satisfactory sedation, the left foot was anesthetized with 10 mL of 1% lidocaine plain. The foot was prepped and draped in the usual sterile fashion. Attention was then directed to the left foot where an elliptical incision was made coursing dorsal to plantar encompassing the base of the third toe. The incision was carried sharply down to the level of bone and the toe was disarticulated and removed. There was noted to be some devitalized tissue in the deep fascia connected to the ulcerative area plantarly. Next, using a pneumatic saw, the head of the third metatarsal was then transected in toto. All edges of the wound were then excisionally debrided with a Versajet debrider on a setting of 6 to 7. The walls of the wound were then washed on a setting of 3. There was noted to be good healthy bleeding from the tissues. Next, the incision was partially closed  using 4-0 nylon vertical mattress and simple interrupted sutures. At this point vancomycin-impregnated rapid cure beads were then placed into the wound and the remainder of the incision was closed using 4-0 nylon simple interrupted sutures. 8 mL of 0.5% Sensorcaine then injected around the surgical site postoperatively followed by Xeroform, 4 x 4's, fluffs, ABDs, Kerlix, and an Ace wrap. The patient tolerated the procedure and anesthesia well and was transported to the PACU with vital signs stable and in good condition.    ____________________________ Sharlotte Alamo, DPM tc:dp D: 08/15/2013 20:00:00 ET T: 08/16/2013 06:07:50 ET JOB#: [id number]  cc: Katha Cabal, MD Sharlotte Alamo, DPM, <Dictator>   Geo Slone DPM ELECTRONICALLY SIGNED 08/30/2013 9:59

## 2014-10-29 NOTE — Consult Note (Signed)
Impression: 54yo WF w/ h/o DM, PVD, CAD admitted with gas gangrene of the right foot.  She underwent surgery on 1/13.  There was no description of necrotizing fasciitis.  Intraop cx are pending.  Her edema and erythema have improved since surgery and initiation of IV antibiotics. While her gram stain has mixed GPC and GNR, the culture is still pending.  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  Continue zosyn for now. If she spikes or develops evidence for further gangrenous changes, would add vanco and clinda (to stop potential toxin production). She is to go for vascular bypass surgery later this week. Will adjust antibiotics after cultures return.  Electronic Signatures: Quency Tober, Heinz Knuckles (MD)  (Signed on 14-Jan-13 14:56)  Authored  Last Updated: 14-Jan-13 14:56 by Sylvie Mifsud, Heinz Knuckles (MD)

## 2014-10-29 NOTE — Discharge Summary (Signed)
PATIENT NAME:  Laura Reed, Laura Reed MR#:  [id number] DATE OF BIRTH:  [birth year]  DATE OF ADMISSION:  12/31/2011 DATE OF DISCHARGE:  01/03/2012   DISCHARGE DIAGNOSIS: Atherosclerotic occlusive disease bilateral lower extremities with rest pain.   SECONDARY DIAGNOSES: 1. Complication vascular device.  2. Diabetes mellitus.  3. Hypertension.  4. Coronary artery disease.   SURGERIES:  1. Left common femoral endarterectomy with profunda femoris endarterectomy and Dacron patch angioplasty.  2. Revision of femoral-femoral bypass graft with redo left femoral anastomosis 12/31/2011.   CONSULTANTS: None.   HISTORY OF PRESENT ILLNESS: Laura Reed is a 54 year old woman who presented with worsening pain in her lower extremities. Work-up demonstrated critical stenoses within the left femoral complex, and therefore she was scheduled for surgical revision. Risks and benefits were reviewed. All questions answered. The patient agrees to proceed.   HOSPITAL COURSE: On the day of admission, the patient underwent successful surgery. Postoperatively, she had an uncomplicated course and on postoperative day #3 was ambulating in the hall, tolerating regular diet, taking minimal oral pain medications. There was absolutely no drainage from the incision, which had a postoperative VAC placed on it. VAC was removed. The skin appeared healthy and intact and the patient is felt fit for discharge. She is discharged to       home. She is to follow up in one week. She is to continue an ADA diet. She is to do light activities and keep a daily dry dressing on her left groin.   ____________________________ Katha Cabal, MD ggs:ap D: 01/03/2012 12:45:18 ET T: 01/03/2012 13:04:15 ET JOB#: [id number]  cc: Katha Cabal, MD, <Dictator> Deborra Medina, MD Katha Cabal MD ELECTRONICALLY SIGNED 01/13/2012 12:23

## 2014-10-29 NOTE — Op Note (Signed)
PATIENT NAME:  Laura Reed, Laura Reed MR#:  [id number] DATE OF BIRTH:  1961/02/25  DATE OF PROCEDURE:  07/18/2011  PREOPERATIVE DIAGNOSIS: Atherosclerotic occlusive disease bilateral lower extremities with gangrene of the right fourth and fifth toes.   POSTOPERATIVE DIAGNOSIS: Atherosclerotic occlusive disease bilateral lower extremities with gangrene of the right fourth and fifth toes.  PROCEDURES PERFORMED:  1. Abdominal aortogram.  2. Right lower extremity distal runoff, third order catheter placement.   PROCEDURE PERFORMED BY: Katha Cabal, MD   SEDATION: Versed 4 mg plus fentanyl 150 mcg administered IV. Continuous ECG, pulse oximetry, and cardiopulmonary monitoring was performed throughout the entire procedure by the interventional radiology nurse. Total sedation time was approximately one hour.   ACCESS: 5 French sheath left common femoral artery.   CONTRAST USED: Isovue 80 mL.   FLUORO TIME: 3.2 minutes.   INDICATIONS: Laura Reed is a 54 year old woman who presented to the office with worsening pain in her foot. She was referred by Podiatry who has examined her and found gangrenous changes to the fourth and fifth toes. She has known atherosclerotic occlusive disease and has undergone multiple interventions in the past. Angiography with the hope for intervention is recommended. The risks and benefits were reviewed. The patient agrees to proceed.   DESCRIPTION OF PROCEDURE: The patient is taken to Special Procedures and placed in the supine position. After adequate sedation is achieved, left groin is prepped and draped in a sterile fashion. 1% lidocaine is infiltrated into soft tissues and access to the left common femoral artery is obtained with a micropuncture needle, microwire followed by micro sheath, J-wire followed by 5 French sheath and 5 French pigtail catheter. Pigtail catheter is positioned in the level of T12 and bolus injection of contrast is utilized to evaluate the aorta in the  AP projection. Pigtail catheter is then repositioned to above the bifurcation. Bilateral oblique views of the pelvis are obtained. Using an advantage wire with the pigtail catheter, the aortic bifurcation is crossed and the wire is advanced and subsequently the catheter down into the common femoral. Hand injection of contrast is then utilized to demonstrate the vascular anatomy at this level. Distal runoffs are then obtained. After review of the images, the catheter is removed over wire. StarClose device is deployed and there are no immediate complications.   INTERPRETATION: The abdominal aorta is opacified with a bolus injection of contrast. Bilateral nephrograms noted single renal arteries and no evidence of renal artery stenosis. There are no significant lesions noted within the aorta. The left common and external iliac artery are patent although diffusely diseased. There are no hemodynamically significant stenoses. On the right, previously stented common iliac artery and internal iliac artery on the right are widely patent, however, the external iliac artery which does demonstrate a previously placed stent is occluded from its origin. Common femoral artery does not reconstitute and, in fact, hand injection of contrast from the catheter positioned within the common femoral artery demonstrates it is occluded. There is reconstitution of the profunda femoris in the tertiary branches. SFA is occluded. Previously noted stent is identified. There is reconstitution of the below-knee popliteal and appears to be three-vessel runoff to the foot.   SUMMARY: Multilevel occlusive disease including the right external, right common femoral, and right superficial femoral artery. Minimal interventional techniques are available and opened and surgical correction is recommended.   ____________________________ Katha Cabal, MD ggs:drc D: 07/22/2011 12:50:20 ET T: 07/22/2011 13:46:11 ET JOB#: [id number]  cc: Katha Cabal, MD, <  Dictator> Deborra Medina, MD Pete Glatter. Vickki Muff, DPM Katha Cabal MD ELECTRONICALLY SIGNED 07/26/2011 12:29

## 2014-10-29 NOTE — Op Note (Signed)
PATIENT NAME:  Laura Reed, Laura Reed MR#:  [id number] DATE OF BIRTH:  September 20, 1960  DATE OF PROCEDURE:  07/20/2011  PREOPERATIVE DIAGNOSIS: Gas gangrene, right fourth toe and forefoot.  POSTOPERATIVE DIAGNOSIS: Gas gangrene, right fourth toe and forefoot.  PROCEDURES:  1. Amputation right fourth and fifth toes with partial fourth and fifth metatarsal head resections.  2. Excisional debridement of infected tissue.   SURGEON: Sharlotte Alamo, DPM   ANESTHESIA: Local MAC.   HEMOSTASIS: None.   ESTIMATED BLOOD LOSS: Minimal.   PATHOLOGY: Right fourth and fifth toes and associated metatarsal heads.   CULTURES: Deep wound cultures, right forefoot.   DRAINS: 4 x 4 saline soaked gauze.   COMPLICATIONS: None apparent.   OPERATIVE INDICATIONS: This is a 54 year old female with recent development of vascular compromise and gangrene to her right fourth and fifth toes. The patient had angiogram earlier this week and will be scheduled for a bypass early next week. After consultation x-rays did show gas in the tissues so the decision was made to proceed with amputation of the fourth and fifth toes and removal of all gangrenous tissues.   DESCRIPTION OF PROCEDURE: The patient was taken to the Operating Room and placed on the table in the supine position. Following satisfactory sedation, the right foot was anesthetized with 10 mL of 0.5% Sensorcaine plain around the lateral metatarsals and forefoot. The foot was then prepped and draped in the usual sterile fashion. Attention was then directed to the lateral aspect of the right foot where an incision was made encompassing the base of the fourth and fifth toes at the junction of the necrotic gangrenous tissues and healthy skin line. An incision was then carried dorsally across the base of the fourth and fifth toes and these were connected in the third interspace. The incision was then carried sharply down to bone, both dorsally and plantarly, and extended back along the  metatarsal shafts. Using a pneumatic saw, the distal aspects of the fifth and fourth metatarsals were transected and the distal metatarsals and associated fourth and fifth toes were then removed in toto. There was noted to be significant necrotic, foul-smelling tissue dorsally over the fourth. Some cultures were taken for identification. Next, using a VersaJet on a setting of 7, excisional debridement was performed of all of the ulcerative tissues dorsally and plantarly as well as medially along the side of the third metatarsal. Very minimal bleeding occurred upon debridement, but following debridement there was healthy granular tissues. The resultant wound was approximately 3 cm in diameter extending all the way down to the depth of the bone at the removed fourth and fifth metatarsals. Superficial subcutaneous tissue, capsular tissue, and bone was all removed. The wound was flushed with copious amounts of sterile saline and the lateral portion of the incision was closed using 4-0 nylon simple interrupted sutures. The wound was then packed with sterile saline soaked gauze and covered with 4 x 4's, ABD, Kerlix, and an Ace bandage. The patient tolerated the procedure and anesthesia well and was transported to post anesthesia care unit with vital signs stable and in good condition.  ____________________________ Sharlotte Alamo, DPM tc:slb D: 07/20/2011 12:11:21 ET T: 07/20/2011 12:22:21 ET JOB#: [id number]  cc: Sharlotte Alamo, DPM, <Dictator> Zayven Powe DPM ELECTRONICALLY SIGNED 08/25/2011 13:27

## 2014-10-29 NOTE — Consult Note (Signed)
PATIENT NAME:  Laura Reed, Laura Reed MR#:  [id number] DATE OF BIRTH:  04-08-1961  DATE OF CONSULTATION:  07/19/2011  REFERRING PHYSICIAN:  Katha Cabal, MD  CONSULTING PHYSICIAN:  Kesley Mullens A. Posey Pronto, MD  PRIMARY CARE PHYSICIAN: Deborra Medina, MD   REASON FOR CONSULTATION: Medical management of diabetes and hypertension.   HISTORY OF PRESENT ILLNESS: Laura Reed is a 54 year old Caucasian female with past medical history of diabetes, coronary artery disease status post CABG, and history of peripheral vascular disease who was admitted on the surgical floor for right fourth digit cellulitis and gangrene. Internal Medicine was consulted for management of diabetes and hypertension. The patient reports her sugars running between 130's to 170's. Blood pressure is fairly well controlled. She is on Levemir and NovoLog at home.   PAST MEDICAL HISTORY:  1. Peripheral vascular disease. Underwent several procedures in the past including right fem-pop bypass along with stent placement in both the lower extremities.  2. Coronary artery disease, status post coronary artery bypass graft.  3. Type II diabetes, insulin requiring.  4. Pilonidal cyst along with surgery March 2011.    ALLERGIES: No known drug allergies.   FAMILY HISTORY: Positive for diabetes.   MEDICATIONS:  1. Atorvastatin 40 mg at bedtime.  2. Coreg 6.25 b.i.d.   3. Clopidogrel 75 mg at bedtime.  4. Digoxin 0.125 p.o. daily at bedtime.  5. Gabapentin 300 mg 2 tablets 3 times a day.  6. Levemir 30 units once a day.  7. Lisinopril 5 mg daily.  8. NovoLog 15 units twice a day.   SOCIAL HISTORY: Ex-smoker, quit in 1999. Nonalcoholic.   REVIEW OF SYSTEMS: CONSTITUTIONAL: No fever, fatigue, weakness. EYES: No blurred or double vision. ENT: No tinnitus, ear pain, hearing loss. RESPIRATORY: No cough, wheeze, hemoptysis. CARDIOVASCULAR: No chest pain, orthopnea, edema. GI: No nausea, vomiting, diarrhea, abdominal pain, or gastroesophageal reflux  disease. GU: No dysuria or hematuria. ENDOCRINE: No polyuria or nocturia. HEMATOLOGY: No anemia or easy bruising. SKIN: Positive for cellulitis of the right lower extremity along with gangrene of the right foot fourth digit. NEUROLOGIC: No cerebrovascular accident. No transient ischemic attack, numbness in lower extremities. PSYCH: No anxiety or depression. All other systems reviewed and negative.   PHYSICAL EXAMINATION:   GENERAL: The patient is awake, alert, oriented x3 not in acute distress.   VITAL SIGNS: Afebrile, pulse 68, blood pressure 131/64, sats 96% on room air.   HEENT: Atraumatic, normocephalic. Pupils equal, round, and reactive to light and accommodation. Extraocular movements intact. Oral mucosa is moist.   NECK: Supple. No JVD. No carotid bruit.   RESPIRATORY: Clear to auscultation bilaterally. No rales, rhonchi, respiratory distress, or labored breathing.   CARDIOVASCULAR: Both the heart sounds are normal. Rate, rhythm is regular. PMI not lateralized. Chest nontender.   EXTREMITIES: Good pedal pulses of the left lower extremity. Right lower extremity is wrapped around with bandage with some cellulitis noted around the midcalf. The patient has developed gangrene around her fourth digit. Again, detailed exam cannot be done since dressing is in place.   NEUROLOGIC: Grossly intact cranial nerves II to XII. No major motor or sensory deficits.   PSYCH: The patient is awake, alert, and oriented x3.   LABORATORY, DIAGNOSTIC, AND RADIOLOGICAL DATA: Basic metabolic panel within normal limits except glucose of 168, BUN 25, creatinine 0.77. Rest of the electrolytes are normal.   ASSESSMENT AND PLAN: 54 year old Laura Reed with: 1. Diabetes, on insulin. Will continue Levemir and sliding scale. Adjust dose of insulin according to  sugars. The patient will need tighter control given her gangrene and cellulitis.  2. Coronary artery disease status post coronary artery bypass graft. Continue  her cardiac medications.  3. Peripheral vascular disease with right foot fourth digit gangrene with surrounding cellulitis on IV Zosyn per Dr. Delana Meyer. Agree with Infectious Disease consultation. She is status post right lower extremity arteriogram per Dr. Delana Meyer.  4. Heavy ex-smoker.  5. DVT prophylaxis. I do recommend DVT prophylaxis with either heparin or Lovenox.   Thank you for the consult. Will follow while the patient is in-house. The patient will be assigned to Dr. Derrel Nip, her primary care, who will follow her in-house.   TIME SPENT: 40 minutes.  ____________________________ Laura Rochester Posey Pronto, MD sap:drc D: 07/19/2011 11:11:30 ET T: 07/19/2011 11:27:32 ET JOB#: [id number]  cc: Mikyah Alamo A. Posey Pronto, MD, <Dictator> Ilda Basset MD ELECTRONICALLY SIGNED 07/22/2011 15:24

## 2014-10-29 NOTE — Consult Note (Signed)
PATIENT NAME:  Laura Reed, GAROFANO MR#:  [id number] DATE OF BIRTH:  1960-12-31  DATE OF CONSULTATION:  07/19/2011  REFERRING PHYSICIAN:   CONSULTING PHYSICIAN:  Sharlotte Alamo, DPM  HISTORY/REASON FOR CONSULTATION: This is a 54 year old female with diabetes and peripheral vascular disease who has been a patient of Dr. Alvera Singh. Recently she was referred over to Dr. Hortencia Pilar for vascular evaluation. The patient had angiogram on 07/18/2011 which revealed significant blockage of the right lower extremity and the patient will most likely need a femoropopliteal bypass on her right lower extremity. The patient does have gangrenous right fourth and fifth toes at this time.   PAST MEDICAL HISTORY:  1. Diabetes with some degree of associated neuropathy.  2. Heart disease. 3. Hypertension. 4. Peripheral vascular disease.   PAST SURGICAL HISTORY:  1. Left lower extremity bypass. 2. Coronary artery bypass, three-vessel. 3. Cesarean section x2.   OUTPATIENT MEDICATIONS: 1. NovoLog mix 70/30 Flex Pen 100 units.  2. Lantus SoloSTAR 100 units/mL solution. 3. Atorvastatin 40 mg one p.o. daily.  4. Digoxin 0.125 mg oral. 5. Gabapentin 600 mg tablet one oral. 6. Clopidogrel bisulfate 75 mg tablet oral. 7. Carvedilol 6.25 mg tablet oral. 8. Lisinopril 5 mg tablet oral. 9. Alprazolam XR 0.5 mg tablet ER 24-hour oral.   ALLERGIES: No known drug allergies.   FAMILY HISTORY: Cancer.   SOCIAL HISTORY: History of tobacco abuse, stop using tobacco three years ago. She denies any alcohol use.   REVIEW OF SYSTEMS: Significant redness and swelling in the right lower extremity. Does have some black discoloration in the right fourth and fifth toes. Significant pain and claudication when walking on the right lower extremity. She denies any stomach pain or heartburn. No frequent or excessive urination. No cough or shortness of breath. No hearing or vision changes. Does not relate any fever or chills.   PHYSICAL  EXAMINATION:   VASCULAR: DP pulses are palpable. PT pulses are not clearly palpable. Capillary filling time is delayed in the digits.   NEUROLOGICAL: There is some loss of light touch distally in the digits on the right foot. It is intact on the plantar midfoot. It appears to be intact on the left. Proprioception is impaired on the right.   INTEGUMENT: Skin is warm, dry, and somewhat atrophic. Significant erythema and edema in the right foot. Black discoloration of the entire right fourth toe and to some degree the medial aspect of the fifth.   MUSCULOSKELETAL: Some loss of muscle mass and tone and strength.   IMPRESSION: Diabetes with some degree of neuropathy, peripheral vascular disease with gangrenous changes, right fourth and fifth.   PLAN: The foot was redressed. At this point, we will wait until the results of her femoropopliteal bypass can be performed to evaluate at what level the toes or forefoot need to be amputated. She will continue on her antibiotics, and we will follow her up at the beginning of next week.  ____________________________ Sharlotte Alamo, DPM tc:slb D: 07/19/2011 11:41:07 ET     T: 07/19/2011 11:57:11 ET        JOB#: [id number] cc: Katha Cabal, MD Andrei Mccook DPM ELECTRONICALLY SIGNED 08/25/2011 13:27

## 2014-10-29 NOTE — Op Note (Signed)
PATIENT NAME:  Laura Reed, Laura Reed MR#:  [id number] DATE OF BIRTH:  07/30/1960  DATE OF PROCEDURE:  12/31/2011  PREOPERATIVE DIAGNOSIS: Atherosclerotic occlusive disease bilateral lower extremities with rest pain, bilateral lower extremities.  POSTOPERATIVE DIAGNOSIS: Atherosclerotic occlusive disease bilateral lower extremities with rest pain, bilateral lower extremities.  PROCEDURES PERFORMED: 1. Left common femoral endarterectomy with Dacron patch angioplasty.  2. Revision of femoral-femoral bypass graft with redo left femoral anastomosis.   SURGEON: Katha Cabal, M.D.   ASSISTANT: Real Cons, PA-C  ANESTHESIA: General by endotracheal intubation.   FLUIDS: Per anesthesia record.   ESTIMATED BLOOD LOSS: 200 mL.   FLUIDS: Per anesthesia record.   SPECIMEN: Plaque and debrided arterial tissue to pathology for permanent section.   INDICATIONS: Laura Reed is a 54 year old woman who presented to the office with increasing pain of bilateral lower extremities. The work-up and physical examination including noninvasive studies and arterial studies demonstrate a high-grade stenosis, at the left femoral-femoral groin anastomosis with worsening of atherosclerotic occlusive disease within the profunda femoris. She is therefore undergoing revision to improve perfusion and prevent thrombosis and loss of limb. The risks and benefits were reviewed, all questions are answered, and the patient agrees to proceed.   DESCRIPTION OF PROCEDURE: The patient is taken to the Operating Room and placed in the supine position. After adequate general anesthesia is induced and appropriate invasive monitors are placed, the patient is prepped from the nipple line to the knees. Both groins are then covered with Ioban. A vertical incision is made through the previous scar and carried down to expose the graft which is then followed down to expose the native arterial circulation. Dissection is carried out along the SFA as  well as the profunda femoris. The two dominant branches of the profunda femoris are then looped with Silastic vessel loops. The ilioinguinal ligament is identified. It is elevated with an [employer] and subsequently, in an area previously undissected, the external iliac artery is looped circumferentially. The proximal 4 to 5 cm of fem-fem graft are then dissected circumferentially. Two separate doses of heparin are given, both are 3000 for a total of 6000, one given during the dissection and a second one prior to clamping. After five minutes, following the second dose, the fem-fem is clamped as is the external iliac, profunda femoris, and superficial femoral arteries. Using an 11 blade scalpel, the graft is transected just above the suture line. It is then flushed with a total of 120 mL of heparinized saline and clamped. The femoral artery is then opened with Potts scissors, in a linear fashion, extending it down into the profunda femoris to the point of the first major division. Large calcific plaque is encountered extending from the external iliac down into the profunda femoris and standard endarterectomy is performed. The distal edge within the profunda femoris is tacked with interrupted 6-0 Prolene. Dacron patch is delivered onto the field, soaked in saline, and then applied to the common femoral and profunda femoris using running 5-0 Prolene suture. Flushing maneuvers are performed and flow is established to the profunda femoris reperfusing the left leg. Several bleeding points are noted. They are treated easily with 6-0 Prolene in pledgeted sutures.   The arteries are then clamped again and the existing femoral bypass is then debrided, trimmed, and applied an end bypass to side femoral artery anastomosis using running 5-0 Prolene. Flushing maneuvers are performed and flow is reestablished to the right leg as well.   After inspection Epicel is placed as is Surgicel  along the suture lines and the wound groin  wound is closed in approximately six layers using interrupted 2-0 Vicryl as well as 3-0 Vicryl. The skin is closed with staples and a VAC is applied.   The patient tolerated the procedure well. There were no immediate complications. Sponge and needle counts were correct. She was taken to the Recovery Room in stable condition. ____________________________ Katha Cabal, MD ggs:slb D: 01/01/2012 07:51:00 ET     T: 01/01/2012 09:35:50 ET        JOB#: [id number] Dolores Lory Jenayah Antu MD ELECTRONICALLY SIGNED 01/01/2012 10:03

## 2014-10-29 NOTE — Consult Note (Signed)
PCP dr Parks Ranger Dr Delana Meyer Dm on Insulincont Levemir and  ssi. adjust dosing of insulin according to sugarscad s/p cabg.HTnhome medswith right foot (4th digit) gangrene with cellulitisIV zosynright LE arteriogram per dr Curlene Dolphin  pt and her sister     Electronic Signatures: Ilda Basset (MD) (Signed on 12-Jan-13 11:00)  Authored   Last Updated: 12-Jan-13 11:10 by Ilda Basset (MD)

## 2014-10-29 NOTE — Op Note (Signed)
PATIENT NAME:  Laura Reed, BOXX MR#:  [id number] DATE OF BIRTH:  1961-05-30  DATE OF PROCEDURE:  12/09/2011  PREOPERATIVE DIAGNOSES:  1. Atherosclerotic occlusive disease of bilateral lower extremities with claudication. 2. Stricture stenosis of the left iliac system.   POSTOPERATIVE DIAGNOSES:  1. Atherosclerotic occlusive disease of bilateral lower extremities with claudication. 2. Stricture stenosis of the left iliac system.   PROCEDURES PERFORMED: 1. Abdominal aortogram.  2. Left lower extremity distal runoff, first order catheter placement, with selection of the iliac.   SURGEON: Hortencia Pilar, MD  SEDATION: Versed 3 mg plus fentanyl 100 mcg administered IV. Continuous ECG, pulse oximetry and cardiopulmonary monitoring was performed throughout the entire procedure by the interventional radiology nurse. Total sedation time was 45 minutes.   ACCESS: 5 French sheath left brachial artery.   FLUORO TIME: Three minutes.   CONTRAST: 54 mL Isovue.   INDICATIONS: [name] is a 54 year old woman who presented to the office with claudication symptoms. She is status post femoral-femoral bypass left to right, also a fem-tib on the right, and monophasic Doppler signals are now being noted in the fem-fem with deterioration of the velocities. Risks and benefits for angiography and intervention were reviewed, all questions answered, and the patient agrees to proceed.   DESCRIPTION OF PROCEDURE: The patient is taken to the special procedures suite and placed in the supine position. After adequate sedation is achieved both groins are prepped and draped in a sterile fashion. The left groin is then evaluated with ultrasound. Unfortunately the femoral artery is not readily identified making accessing the femoral artery, given the presence of the fem-fem, ill advised, and therefore the left arm is then extended and prepped and draped in a sterile fashion. The floppy glidewire and pigtail catheter are then  advanced into the descending aorta and abdominal aortogram is obtained in the AP projection. Pigtail catheter and wire are then negotiated into the iliac representing first order catheter placement and bilateral oblique views of the pelvis are obtained as well as the groin. After review of these images, the catheter is pulled out over the wire, sheath is pulled, pressure is held for 10 minutes, and the arm is immobilized. The patient tolerated the procedure well and there were no immediate complications.   INTERPRETATION: The abdominal aorta is widely patent as is the left common and external iliac. The right common iliac is stented and is patent filling the internal iliac. On the right, the external iliac is occluded. The fem-fem is patent as is the common femoral bilaterally and the right fem-tib is patent, however, the origin of the fem-fem is notable for approximately a 60% stenosis. No other hemodynamically significant abnormalities are noted. This would suggest operative revision to be a more suitable option. ____________________________ Katha Cabal, MD ggs:slb D: 12/09/2011 10:14:54 ET T: 12/09/2011 12:17:36 ET JOB#: [id number]  cc: Katha Cabal, MD, <Dictator> Deborra Medina, MD Katha Cabal MD ELECTRONICALLY SIGNED 12/16/2011 8:57

## 2014-10-29 NOTE — Consult Note (Signed)
PATIENT NAME:  Laura Reed, Laura Reed MR#:  [id number] DATE OF BIRTH:  [birth year]  DATE OF CONSULTATION:  07/21/2011  REFERRING PHYSICIAN:  Katha Cabal, MD CONSULTING PHYSICIAN:  Heinz Knuckles. Lidie Glade, MD  REASON FOR CONSULTATION: Gas gangrene.   HISTORY OF PRESENT ILLNESS: The patient is a 54 year old white female with a past history significant for diabetes, peripheral vascular disease, coronary artery disease who was admitted on 07/19/2011 with approximately one-week history of swelling, redness of the right foot and leg. She initially noted some swelling in her leg that would not resolve with elevation. She had some discomfort and had noted some drainage underneath her toes. She began developing redness up her leg as well. She was seen as an outpatient and given Augmentin and while she feels this may have helped some of the redness, she had darkening of the toes and eventually presented to vascular surgery and was subsequently admitted. She was started on Zosyn and her redness has improved. She underwent amputation of the right fourth and fifth toe and forefoot on 01/13. She states that she has been feeling better since the surgery. The x-rays prior surgery showed gas in the soft tissue. Review of the operative note did not demonstrate any evidence for necrotizing fasciitis. She has not had systemic fevers, chills, sweats, or malaise.   ALLERGIES: None.   PAST MEDICAL HISTORY:  1. Diabetes.  2. Peripheral vascular disease, status post right femoropopliteal bypass and stent placement.  3. Coronary artery disease status post coronary artery bypass graft.  4. Pilonidal cyst status post resection in March 2011.   FAMILY HISTORY: Positive for diabetes.   SOCIAL HISTORY: The patient is a prior smoker. She does not drink.   REVIEW OF SYSTEMS: GENERAL: No fevers, chills, sweats, or malaise. HEENT: No headache. No sinus congestion. No sore throat. She did have recent URI a week or so prior to becoming  ill, but this has mostly resolved. NECK: No stiffness. No swollen glands. RESPIRATORY: No cough or shortness of breath. No sputum production. CARDIAC: No chest pains or palpitations. GI: No nausea, vomiting, abdominal pain or change in her bowels. GENITOURINARY: No change in her urine. MUSCULOSKELETAL: She has had swelling and pain in the right foot. No focal joint pain however. SKIN: She has had redness along the right lower extremity and drainage from the toes on the right. NEUROLOGIC: No focal weakness. PSYCHIATRIC: No complaints. All other systems are negative.   PHYSICAL EXAMINATION:  VITAL SIGNS: T-max 98.6, pulse 77, blood pressure 131/72, 95% on room air.   GENERAL: 54 year old white female in no acute distress.   HEENT: Normocephalic, atraumatic. Pupils equal and reactive to light. Extraocular motion intact. Sclerae, conjunctivae, and lids are without evidence for emboli or petechiae although there is some minimal scleral injection. Oropharynx shows no erythema or exudate. Teeth and gums are in fair condition.   NECK: Supple. Full range of motion. Midline trachea. No lymphadenopathy. No thyromegaly.   LUNGS: Clear to auscultation bilaterally with good air movement. No focal consolidation.   HEART: Regular rate and rhythm without murmur, rub, or gallop.   ABDOMEN: Soft, nontender, and nondistended. No hepatosplenomegaly. No hernias noted.   EXTREMITIES: No evidence for tenosynovitis. The right foot was wrapped in bandages. She is status post amputation of the fourth and fifth toes and metatarsal heads. The wound was not directly observed as it is wrapped in bandages. The right lower extremity did not have significant erythema or edema. There was a pen mark that appears to  have marked the prior erythema and there is no surrounding erythema around this area.   SKIN: Other than the surgical wound, there is no ulcerations or rashes. There are no stigmata of endocarditis, specifically no  Janeway lesions or Osler nodes.   NEUROLOGIC: The patient is awake and interactive, moving all four extremities.   PSYCHIATRIC: Mood and affect appeared normal.   LABORATORY, DIAGNOSTIC, AND RADIOLOGICAL DATA: BUN 25, creatinine 0.77, bicarbonate 29, anion gap 8. White count of 13.1 with a hemoglobin 10.8, platelet count 336, ANC 9.2. No white count was obtained on admission, however, yesterday her white count was 14.6 with an ANC of 11.5. A wound culture from 01/13 is growing heavy growth of an unidentified organism. Gram stain showed gram-positive cocci in singles and a gram-negative rod. A plain film of the right foot showed soft tissue gas in the distal lateral foot concerning for infection. There is no evidence for obvious osteomyelitis.   IMPRESSION: A 54 year old white female with a history of diabetes, peripheral vascular disease, coronary artery disease admitted with gas gangrene of the right foot.   RECOMMENDATIONS:  1. She underwent surgery on 01/13. There is no description of necrotizing fasciitis. Intraoperative cultures are pending. Her edema and erythema have improved since surgery and the initiation of IV antibiotics.  2. While her Gram stain has mixed gram-positive cocci and gram-negative rods, the culture is still pending. 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 We will continue Zosyn for now.  3. If she spikes a temperature or develops evidence for further gangrenous changes, would add vancomycin to cover resistant gram-positive cocci and clindamycin to stop potential toxin production.  4. She is to go for vascular bypass surgery later this week.  5. We will adjust antibiotics after her cultures return.   This is a moderately complex infectious disease case. Thank you very much for involving me in [name]' care.    ____________________________ Heinz Knuckles. Kirt Chew, MD meb:rbg D: 07/21/2011 15:04:18  ET T: 07/22/2011 08:24:08 ET JOB#: [id number]  cc: Heinz Knuckles. Hazel Leveille, MD, <Dictator> Illeana Edick E Tallin Hart MD ELECTRONICALLY SIGNED 07/23/2011 9:34

## 2014-10-29 NOTE — Op Note (Signed)
PATIENT NAME:  Laura Reed, BIEL MR#:  [id number] DATE OF BIRTH:  [birth year]  DATE OF PROCEDURE:  07/25/2011  PREOPERATIVE DIAGNOSIS:  1. 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  2. Gangrene right fourth and fifth toes status post open amputation.  3. Cellulitis right lower extremity secondary to gangrenous foot.   POSTOPERATIVE DIAGNOSES:    1. 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  2. Gangrene right fourth and fifth toes status post open amputation.  3. Cellulitis right lower extremity secondary to gangrenous foot.   PROCEDURES PERFORMED:  1. Left femoral to right femoral bypass grafting using ringed 6 mm PTFE.  2. Right profunda femoris to tibioperoneal trunk bypass grafting using reversed greater saphenous vein.   SURGEON: Hortencia Pilar, MD.   ASSISTANT: Real Cons, PA-C.   ANESTHESIA: General by LMA.   FLUIDS: Per anesthesia record.  ESTIMATED BLOOD LOSS: 300 mL.  SPECIMEN: None.   INDICATIONS: [name] is a 54 year old woman with an extensive vascular history who has undergone multiple interventions and operations in the past. She presents on this occasion with gangrenous changes to the foot associated with cellulitis. Angiography demonstrated a situation that was not amenable to intervention and she is therefore undergoing bypass surgery. Risks and benefits were reviewed. All questions are answered. The patient agrees to proceed.   DESCRIPTION OF PROCEDURE: The patient is taken to the Operating Room and placed in the supine position. After adequate general anesthesia is induced and appropriate invasive monitors are placed, she is positioned supine and her right leg is prepped circumferentially. The abdominal pelvic and left thigh are also prepped and draped in a sterile fashion.   Linear incisions are created, vertical orientation both groins, the right groin through a  previous incisional scar which, unfortunately, is located significantly laterally. The dissections are then carried down on the right side to expose the femoral vein. The space between the vein and the artery is then dissected free. Common femoral previous bypass is then identified and subsequently the SFA and then the profunda femoris is identified. Profunda femoris is then dissected down to its quaternary branches and approximately 6 or 7 Silastic vessel loops are used to loop each individual branch. The left side of the common femoral artery is exposed. Small branch is ligated between 5-0 Prolenes and the origin of the profunda is exposed as well as the origin of the SFA. These are each individually looped with Silastic vessel loops. Using a Gore tunneler, a tunnel is created in the suprafascial plane and umbilical tape was pulled through the tunnel. A 6 mm ringed PTFE graft is opened onto the field.   The saphenous vein having been previously mapped with ultrasound is then exposed in the calf. It is noted to be adequate, appearing to be at least 3.5 mm in diameter. The vein is then skeletonized, ligating branches with 3-0 silk ties clipping the opposite end and then transecting them. Ultimately, an incision is created from the calf through the previous thigh incision and then up connecting to the right groin incision. Saphenous vein is exposed in its entirety. Again, ligating branches as they are encountered as described above.   The popliteal space is then entered. The below the knee popliteal is identified. It is dissected circumferentially. The dissection is then carried distally to expose the anterior tibial origin as well as the first several millimeters of the tibioperoneal trunk.   5,000 units of heparin are given. The 6  mm ringed PTFE graft is pulled through the subcutaneous tunnel, beveled. The left common femoral, profunda femoris, and superficial femoral arteries are occluded. Arteriotomy is  made, extended with Potts scissors. The graft is then beveled to an appropriate appearance and an end graft to side common femoral, profunda femoris artery anastomosis is fashioned as the arteriotomy extended through the origin of the profunda femoris. CV-6 Gore suture is used for the anastomosis. Flushing maneuvers are performed. The graft dilates nicely. Anastomosis is intact and the graft is then irrigated copiously with heparinized saline and clamped just above the suture line. Having pressurized the graft and adjusted it for length, it is approximated to the profunda femoris. The profunda femoris is then controlled with Silastic vessel loops. Arteriotomy is made, extended with Potts scissors. The graft is then trimmed. The rings are removed as needed. It is beveled and subsequently an end graft to side profunda femoris anastomosis is fashioned with running CV-6 suture. Arteriotomy is then made in the hood of the graft itself and ellipse of prosthetic is removed. The vein is then ligated with 2-0 silk proximally and 3-0 silk distally, transected. It is then irrigated with heparinized saline, dilated gently over a curved [employer] sound beginning with an 8, 10 and then a 12 sound. This correlates to approximately 3.5 mm vein. It is then reversed and spatulated. An end vein to side graft/profunda femoris anastomosis is fashioned in the right groin at the level of the femorofemoral anastomosis. The vein is then pressurized. The anastomosis is flushed copiously and then flow is re-established to the profunda femoris. Excellent distal pulse is noted. The vein is then clamped distally to allow it to be completely pressurized. Small branches that were not ligated are now identified and ligated with 6-0 Prolene. Once the vein has been interrogated completely for hemostasis, it is laid in the native bed. The leg is straightened and the vein is marked. A small segment of the vein is trimmed to provide for appropriate  length. The popliteal artery, tibioperoneal trunk and anterior tibial are then controlled with Silastic vessel loops. Arteriotomy is made, extended past the origin of the anterior tibial down onto the tibioperoneal trunk and the vein is spatulated and vein to side distal anastomosis is fashioned with running 6-0 Prolene. Flushing maneuvers are performed and flow is established distally into the foot. Excellent pulse is noted throughout the system. The wounds were then all inspected for hemostasis, irrigated copiously with a liter of saline. Surgicel is placed around the suture lines and then the wounds are all closed, closing both groins in five layers using 2-0 followed by multiple layers of 3-0 followed by surgical clips for the skin. The incision for the saphenous vein in the distal anastomosis is closed in two layers of 3-0 followed by surgical staples. VAC wound dressing is applied. The patient tolerated the procedure well. There were no immediate complications. Sponge and needle counts were correct. She is taken to recovery room in stable condition.  ____________________________ Katha Cabal, MD ggs:ap D: 07/25/2011 18:35:31 ET T: 07/26/2011 09:36:02 ET JOB#: [id number]  cc: Katha Cabal, MD, <Dictator> Deborra Medina, MD  Katha Cabal MD ELECTRONICALLY SIGNED 07/26/2011 12:29

## 2014-10-29 NOTE — Consult Note (Signed)
PATIENT NAME:  Laura Reed, Laura Reed MR#:  [id number] DATE OF BIRTH:  01/09/1961  DATE OF CONSULTATION:  07/19/2011  REFERRING PHYSICIAN:   CONSULTING PHYSICIAN:  Dionisio David, MD  INDICATION: Preoperative clearance for bilateral femoral-popliteal bypass surgery to be done by Dr. Hortencia Pilar.   HISTORY OF PRESENT ILLNESS: This is a 54 year old white female with a past medical history of hypertension, diabetes, hypercholesterolemia, and peripheral vascular disease who came into the hospital because of pain in the right leg and right foot. Her fourth and fifth toes were gangrenous and the whole foot was developing cellulitis. She had angiogram of the right lower extremity by Dr. Delana Meyer and it showed bilateral femoral popliteal disease. She is thus to have bypass surgery by Dr. Delana Meyer of femoropopliteal arteries thus I was asked to evaluate the patient for preoperative clearance. She denies any chest pain, shortness of breath, PND, or orthopnea, but she does have some trace pedal edema in the lower extremity.   PAST MEDICAL HISTORY:  1. Diabetes. 2. Hypertension. 3. Hypercholesterolemia. 4. Peripheral vascular disease.  5. CABG, three-vessel, in [place], in 2009. Since then she has been quite stable.   SOCIAL HISTORY: She is an ex-smoker. She does not smoke right now. She denies EtOH abuse.   PHYSICAL EXAMINATION:   GENERAL: She is alert and oriented x3, in no acute distress.   VITAL SIGNS: Blood pressure 131/64, respirations 18, pulse 68, temperature 97.9, and pulse oximetry 96%.   NECK: No JVD.   LUNGS: Clear.   HEART: Regular rate and rhythm. Normal S1 and S2. No audible murmur.   ABDOMEN: Soft and nontender, positive bowel sounds.   EXTREMITIES: No pedal edema.   NEUROLOGIC: She appears to be intact.   LABS/STUDIES: EKG shows normal sinus rhythm, 68 beats per minute, incomplete right bundle branch block, nonspecific ST-T changes.   Her BUN and creatinine are  unremarkable, 0.77 and 25. Glucose is slightly elevated.  ASSESSMENT AND PLAN: The patient has coronary artery disease status post CABG, three-vessel, in 2009, but she is asymptomatic. She denies any chest pain or shortness of breath. She requires femoral popliteal bypass surgery, which is moderate to high risk surgery. However, she is at low risk for cardiac event because she is clinically stable. Her EKG also looks unremarkable. I advise proceeding with surgery. Thank you very much for this referral.  ____________________________ Dionisio David, MD sak:slb D: 07/19/2011 11:09:38 ET T: 07/19/2011 11:42:24 ET JOB#: [id number]  cc: Dionisio David, MD, <Dictator> Dionisio David MD ELECTRONICALLY SIGNED 08/05/2011 9:01

## 2014-10-29 NOTE — Discharge Summary (Signed)
PATIENT NAME:  Laura Reed, Laura Reed MR#:  [id number] DATE OF BIRTH:  Mar 01, 1961  DATE OF ADMISSION:  07/18/2011 DATE OF DISCHARGE:  07/30/2011  DISCHARGE DIAGNOSIS: Atherosclerotic occlusive disease of right lower extremity with gangrene of the right fourth and fifth toes.   SECONDARY DIAGNOSES:  1. Hyperlipidemia. 2. Coronary artery disease. 3. Atrial fibrillation.  4. Neuropathy. 5. Diabetes. 6. Hypertension.   CONSULTANTS:  1. Cardiology. 2. [facility] for medical management. 3. Samara Deist, DPM - Podiatry.  PROCEDURES:   1. Abdominal aortogram with right lower extremity distal runoff, third order catheter placement, 07/18/2011.  2. Amputation of fourth and fifth toes, right foot, 07/20/2011.  3. Femoral to femoral bypass grafting with femoral to tibial reverse saphenous vein bypass, right leg, 07/25/2011.   HISTORY OF PRESENT ILLNESS: Laura Reed is a 54 year old woman who was referred to the office by Dr. Vickki Muff for evaluation of a painful, cold right lower extremity associated with gangrene of the fourth and fifth toes. Upon evaluation angiography was set up with the hope for intervention.   On the day of admission, the patient underwent angiography, however, she was found not to be an interventional candidate. She was subsequently admitted and maintained on heparin. Cardiology and podiatry as well as internal medicine saw the patient. She underwent appropriate preoperative testing. On hospital day number three, she underwent amputation of her gangrenous toes. Subsequently she was cleared for surgery and on 07/25/2011 she underwent left femoral to right femoral bypass grafting with 6 mm PTFE and subsequently a femoral to tibial reverse saphenous vein bypass was also performed on the right leg for wound healing. Postoperatively, she had an uneventful course and on postoperative day number five she was felt fit for discharge. She is discharged to home. She will followup with Dr. Vickki Muff as  arranged, she will followup with me in 1 to 2 weeks, and she will followup with Dr. Derrel Nip within the month.   MEDICATIONS: Her medications are as noted on discharge.   CONDITION ON DISCHARGE: Improved.   DIET: Healthy heart.  ____________________________ Katha Cabal, MD ggs:slb D: 08/15/2011 15:10:14 ET T: 08/15/2011 16:00:13 ET JOB#: [id number]  cc: Katha Cabal, MD, <Dictator> Katha Cabal MD ELECTRONICALLY SIGNED 08/20/2011 11:09

## 2014-10-29 NOTE — Consult Note (Signed)
PATIENT NAME:  Laura Reed, Laura Reed MR#:  [id number] DATE OF BIRTH:  Aug 04, 1960  DATE OF CONSULTATION:  07/19/2011  REFERRING PHYSICIAN:  Hortencia Pilar, MD  CONSULTING PHYSICIAN:  Marianny Goris A. Posey Pronto, MD  PRIMARY CARE PHYSICIAN: Deborra Medina, MD   REASON FOR CONSULTATION: Medical management of diabetes and hypertension.   HISTORY OF PRESENT ILLNESS: Ms. Shadduck is a 54 year old Caucasian female with history of diabetes, insulin requiring, severe peripheral vascular disease requiring femoral bypass in the right lower extremity along with stent placement in bilateral lower extremities who is admitted on the surgical floor for management of right foot fourth toe gangrene and cellulitis. Internal Medicine was consulted for management of diabetes and hypertension.   The patient states her sugars remain between 130 to 170 range. She quit smoking about three years ago and her blood pressure has been staying stable. <<MISSING TEXT>> (Dictation Cut Off Here)  ____________________________ Gus Height A. Posey Pronto, MD sap:drc D: 07/19/2011 11:02:20 ET T: 07/19/2011 11:22:42 ET JOB#: [id number]  cc: Matan Steen A. Posey Pronto, MD, <Dictator> Deborra Medina, MD

## 2014-10-29 NOTE — Consult Note (Signed)
PATIENT NAME:  Laura Reed, Laura Reed MR#:  [id number] DATE OF BIRTH:  1960-07-13  DATE OF CONSULTATION:  07/21/2011  REFERRING PHYSICIAN:  Mike Craze A. Royden Purl, MD  CONSULTING PHYSICIAN:  A. Lavone Orn, MD  PRIMARY CARE PHYSICIAN: Deborra Medina, MD  REASON FOR CONSULTATION: Uncontrolled diabetes.   HISTORY OF PRESENT ILLNESS: This is a 54 year old female with long-standing uncontrolled type 2 diabetes admitted with gangrene of the right foot. She underwent amputation of the right fourth and fifth toes with partial fourth and fifth metatarsal head resection on 07/19/2010. Diabetes is complicated by peripheral neuropathy and retinopathy. Her most recent hemoglobin A1c done earlier today was 11.2% confirming uncontrolled diabetes. Her home diabetes regimen includes NovoLog 70/30 mix 15 units b.i.d. and Levemir 30 units at bedtime. She claims to be checking her blood sugars 2 to 3 times daily and recalls that blood sugars were running 140-160 typically the week prior to admission. Her current inpatient regimen is Levemir 30 units with supper and regular insulin 15 units twice daily in addition to a regular insulin sliding scale of 4 units if sugar 200-250 plus an additional 2 units per 50/251. Over the last 24 hours her sugars have been in the range of 93-273. I did confirm that she was given Levemir yesterday, however she did miss her regular insulin at lunchtime yesterday resulting in a higher suppertime blood sugar. Currently she has no acute complaints. She has some pain in the right foot, but she reports her pain medications are controlling this fairly well. She reports a good appetite. She has had no nausea.   PAST MEDICAL HISTORY:  1. Type 2 diabetes. A. Diabetic peripheral neuropathy. B. Diabetic retinopathy.  2. Coronary artery disease.  3. Peripheral vascular disease.  4. History of tobacco dependence.  5. Hypertension.  6. Hyperlipidemia.   PAST SURGICAL HISTORY:  1. Coronary artery bypass graft  2009.  2. Right femoropopliteal bypass.  3. Left lower extremity bypass.  4. Cesarean section x2.   CURRENT INPATIENT MEDICATIONS:  1. Levemir 30 units daily.  2. Coreg 6.25 mg b.i.d.  3. Neurontin 300 mg t.i.d.  4. Zosyn 3.375 every 8 hours. 5. Novolin R 15 units b.i.d. before meals.  6. Regular insulin sliding scale.  7. Percocet 5 to 325 q.6 p.r.n. pain.   SOCIAL HISTORY: She quit smoking in 2009. She quit alcohol in 2009.   FAMILY HISTORY: Positive for diabetes.   REVIEW OF SYSTEMS: HEENT: No blurred vision. No sore throat. NECK: No neck pain. No dysphagia. CARDIAC: No chest pain. No palpitation. PULMONARY: No cough. No shortness of breath. ABDOMEN: No abdominal pain. Good appetite. No nausea or vomiting. EXTREMITIES: No leg swelling. Status post amputation of right fourth/fifth digits. Some pain in the right foot. HEME: No easy bruisability or recent bleeding. SKIN: Denies rash or recent skin changes. No pruritus. ENDO: No heat or cold intolerance.   PHYSICAL EXAMINATION:  VITAL SIGNS: Temperature 98.6, pulse 77, respirations 20, blood pressure 131/72, and pulse oximetry 95% on room air.   GENERAL: A well-developed white female in no acute distress.   HEENT: Extraocular movements are intact. No conjunctival injection. Oropharynx is clear. Mucous membranes moist.   NECK: Supple. No thyromegaly.   CARDIAC: Regular rate and rhythm. No carotid bruit.   LUNGS: Clear to auscultation bilaterally. No wheeze. Good inspiratory effort.   ABDOMEN: Diffusely soft, nontender, nondistended.   EXTREMITIES: Right foot was dressed and dressing was clean and dry. SATs are in place.   SKIN: No rash is  appreciated. No ecchymotic lesions are seen.   NEUROLOGIC: No tremor is present.   PSYCHIATRIC: Alert and oriented x3.   LABORATORY, DIAGNOSTIC AND RADIOLOGICAL DATA: Hemoglobin A1c 11.2%. WBC 13.1, hematocrit 32.9%.   ASSESSMENT: 54 year old female with long-standing uncontrolled diabetes  complicated by peripheral neuropathy and retinopathy admitted for gangrene of right fourth/fifth digits status post amputation of the digits. Diabetes remains uncontrolled on her current regimen of Levemir and regular insulin.   PLAN:  1. Will put her on her home regimen of Levemir and NovoLog 70/30 mix, however will adjust her 70/30 mix slightly to give 20 units b.i.d. Will change her sliding scale to NovoLog 2 units per 50 over a target of 150.  2. As an outpatient it is likely she would need an ACE inhibitor and also could consider use of metformin as her renal function is normal.   Thank you for the kind request for consultation. I will follow along with you.   ____________________________ A. Lavone Orn, MD ams:rbg D: 07/21/2011 17:36:49 ET T: 07/22/2011 10:01:09 ET JOB#: [id number]  cc: A. Lavone Orn, MD, <Dictator>  Deborra Medina, MD Sherlon Handing MD ELECTRONICALLY SIGNED 07/24/2011 13:41

## 2014-10-30 LAB — SURGICAL PATHOLOGY

## 2014-10-31 ENCOUNTER — Other Ambulatory Visit: Payer: 59

## 2014-10-31 ENCOUNTER — Inpatient Hospital Stay
Admit: 2014-10-31 | Disposition: A | Discharge status: Home / Self Care | Payer: Self-pay | Attending: Urology | Admitting: Urology

## 2014-10-31 DIAGNOSIS — I10 Essential (primary) hypertension: Secondary | ICD-10-CM | POA: Diagnosis not present

## 2014-10-31 DIAGNOSIS — I2510 Atherosclerotic heart disease of native coronary artery without angina pectoris: Secondary | ICD-10-CM | POA: Diagnosis not present

## 2014-10-31 DIAGNOSIS — C642 Malignant neoplasm of left kidney, except renal pelvis: Secondary | ICD-10-CM | POA: Diagnosis not present

## 2014-10-31 DIAGNOSIS — Z951 Presence of aortocoronary bypass graft: Secondary | ICD-10-CM | POA: Diagnosis not present

## 2014-10-31 DIAGNOSIS — C652 Malignant neoplasm of left renal pelvis: Secondary | ICD-10-CM | POA: Diagnosis not present

## 2014-10-31 DIAGNOSIS — I739 Peripheral vascular disease, unspecified: Secondary | ICD-10-CM | POA: Diagnosis not present

## 2014-10-31 DIAGNOSIS — C649 Malignant neoplasm of unspecified kidney, except renal pelvis: Secondary | ICD-10-CM

## 2014-10-31 DIAGNOSIS — Z87891 Personal history of nicotine dependence: Secondary | ICD-10-CM | POA: Diagnosis not present

## 2014-10-31 DIAGNOSIS — K439 Ventral hernia without obstruction or gangrene: Secondary | ICD-10-CM | POA: Diagnosis not present

## 2014-10-31 DIAGNOSIS — E119 Type 2 diabetes mellitus without complications: Secondary | ICD-10-CM | POA: Diagnosis not present

## 2014-10-31 HISTORY — PX: HERNIA REPAIR: SHX51

## 2014-10-31 HISTORY — PX: NEPHRECTOMY: SHX65

## 2014-10-31 HISTORY — DX: Malignant neoplasm of unspecified kidney, except renal pelvis: C64.9

## 2014-11-01 ENCOUNTER — Encounter: Payer: Self-pay | Admitting: General Surgery

## 2014-11-01 LAB — CBC WITH DIFFERENTIAL/PLATELET
BASOS ABS: 0.1 10*3/uL (ref 0.0–0.1)
BASOS PCT: 0.5 %
EOS ABS: 0.1 10*3/uL (ref 0.0–0.7)
EOS PCT: 0.4 %
HCT: 34 % — ABNORMAL LOW (ref 35.0–47.0)
HGB: 11.1 g/dL — AB (ref 12.0–16.0)
LYMPHS ABS: 0.5 10*3/uL — AB (ref 1.0–3.6)
Lymphocyte %: 3.7 %
MCH: 28.6 pg (ref 26.0–34.0)
MCHC: 32.8 g/dL (ref 32.0–36.0)
MCV: 87 fL (ref 80–100)
MONOS PCT: 5.7 %
Monocyte #: 0.8 x10 3/mm (ref 0.2–0.9)
Neutrophil #: 12.8 10*3/uL — ABNORMAL HIGH (ref 1.4–6.5)
Neutrophil %: 89.7 %
Platelet: 227 10*3/uL (ref 150–440)
RBC: 3.89 10*6/uL (ref 3.80–5.20)
RDW: 13.3 % (ref 11.5–14.5)
WBC: 14.3 10*3/uL — AB (ref 3.6–11.0)

## 2014-11-01 LAB — BASIC METABOLIC PANEL
ANION GAP: 5 — AB (ref 7–16)
BUN: 19 mg/dL
CHLORIDE: 113 mmol/L — AB
CREATININE: 0.87 mg/dL
Calcium, Total: 7.4 mg/dL — ABNORMAL LOW
Co2: 23 mmol/L
EGFR (African American): >60
EGFR (Non-African Amer.): >60
Glucose: 156 mg/dL — ABNORMAL HIGH
POTASSIUM: 4.2 mmol/L
SODIUM: 141 mmol/L

## 2014-11-02 LAB — CBC WITH DIFFERENTIAL/PLATELET
BASOS ABS: 0 10*3/uL (ref 0.0–0.1)
Basophil %: 0.4 %
EOS ABS: 0.3 10*3/uL (ref 0.0–0.7)
Eosinophil %: 2.6 %
HCT: 29.5 % — ABNORMAL LOW (ref 35.0–47.0)
HGB: 9.5 g/dL — ABNORMAL LOW (ref 12.0–16.0)
LYMPHS ABS: 1.2 10*3/uL (ref 1.0–3.6)
LYMPHS PCT: 9.3 %
MCH: 28 pg (ref 26.0–34.0)
MCHC: 32.2 g/dL (ref 32.0–36.0)
MCV: 87 fL (ref 80–100)
MONOS PCT: 8.8 %
Monocyte #: 1.1 x10 3/mm — ABNORMAL HIGH (ref 0.2–0.9)
NEUTROS ABS: 10.1 10*3/uL — AB (ref 1.4–6.5)
Neutrophil %: 78.9 %
PLATELETS: 217 10*3/uL (ref 150–440)
RBC: 3.38 10*6/uL — AB (ref 3.80–5.20)
RDW: 13.6 % (ref 11.5–14.5)
WBC: 12.8 10*3/uL — ABNORMAL HIGH (ref 3.6–11.0)

## 2014-11-02 LAB — BASIC METABOLIC PANEL
Anion Gap: 5 — ABNORMAL LOW (ref 7–16)
BUN: 20 mg/dL
CREATININE: 1.21 mg/dL — AB
Calcium, Total: 7.3 mg/dL — ABNORMAL LOW
Chloride: 107 mmol/L
Co2: 23 mmol/L
GFR CALC AF AMER: 58 — AB
GFR CALC NON AF AMER: 50 — AB
Glucose: 108 mg/dL — ABNORMAL HIGH
POTASSIUM: 4.2 mmol/L
Sodium: 135 mmol/L

## 2014-11-05 NOTE — Op Note (Signed)
PATIENT NAME:  Laura Reed, Laura Reed MR#:  [id number] DATE OF BIRTH:  15-Mar-1961  DATE OF PROCEDURE:  10/31/2014  PREOPERATIVE DIAGNOSES: Left upper tract urothelial carcinoma, abdominal ventral wall hernia.    POSTOPERATIVE DIAGNOSES: Left upper tract urothelial carcinoma, abdominal ventral wall hernia.  PROCEDURE PERFORMED: Left robotic nephroureterectomy.   ATTENDING SURGEON: Sherlynn Stalls, MD   ANESTHESIA: General anesthesia.  ESTIMATED BLOOD LOSS: 200 mL.   DRAINS: JP drain, 18 French 3-way Foley catheter.   COMPLICATIONS: None.   SPECIMENS: Left kidney with ureter and bladder cuff.   INDICATION: This is a 54 year old female with a history of gross hematuria and a left upper tract filling defect who ultimately underwent ureteroscopy, at which time a fairly sizable left upper tract tumor was identified. The  tumor was biopsied and was consistent with urothelial carcinoma. Given the size of the tumor, she was counseled to undergo left nephroureterectomy. This did appear to be amenable to a robotic approach. She also has a large intra-abdominal wall hernia and was previously scheduled to undergo hernia repair with Dr. Bary Castilla. We have arranged to perform a combined case today with extraction of nephroureterectomy through the planned ventral hernia repair site. Risks and benefits of the procedure were explained in detail to the patient who agreed to proceed as planned.   PROCEDURE: The patient was correctly identified in the preoperative holding area and informed consent was confirmed. She was brought to the operating suite and placed on the table in the supine position. At this time, universal timeout protocol performed. All team members were identified. Venodyne boots were placed and she was administered 2 grams of IV Ancef in the perioperative period. An 59 French 3-way Foley catheter was then placed using standard sterile technique and hooked up to a CBI for planned use later in the  procedure. She was then repositioned in the lateral decubitus position with her left flank up and all pressure points were carefully padded. An axillary roll was placed using a gel bolster just below the axilla and she was secured to the table using straps, gel pads, and tape. She was then prepped and draped in standard surgical fashion. At this point in time, a Veress needle was used in the left lateral mid abdomen to insufflate the abdominal cavity. There was negative saline drop test without any bloody aspirate and she did have relatively low opening pressures of 8 mmHg. She insufflated nicely and it was well tolerated. At this point, all port sites were carefully planned and mapped out across her left hemi-abdomen; 0.5% percent Marcaine was used in all of the laparoscopic port sites. The port sites were as follows: A 5 mm assistant port was placed just below the xiphoid in the midline, a 12 mm assistant port was placed in the midline just above the level of the umbilicus, an additional 12 mm midabdominal port site was placed and used for a camera port using a bariatric 12 mm trocar, an 8 mm robotic port was placed in the left upper quadrant in the mammary line just below the costal margin, 2 additional 8 mm robotic port sites were placed in the left lateral lower abdomen and left lower mid abdomen as used for additional robotic port sites. The initial trocar was placed using a 5 mm Visiport, which was later converted to an 8 mm robotic arm. There was absolutely no injury noted from the Veress needle or trocar sites. The robot was then brought in and docked using bipolar [place] in  the left, scissors on the right with a double fenestrated grasper in the 4th arm. The white line of Toldt was incised and the left hemicolon was reflected medially. There was a nice plane here and Gerota fascia could be easily identified. The ureter and gonadal vein were then identified in the lower portion. The tail of Gerota was  then traced superiorly towards the hilum. The gonadal vein was dissected out and eventually ligated using a Weck clips x 2 and transected near the level of the renal vein. The hilum was then further dissected very carefully and precisely such that 1 single renal vein was identified as well as 1 single artery just posterior and slightly superior to the vein. The dissection was then carried up inferiorly and a plane was created between the adrenal gland and the middle aspect of the upper pole of the kidney. The splenorenal ligament was then incised and the spleen was mobilized superiorly as well. A plane was then created posterior to the kidney, and the kidney was retracted superiorly to place the hilum on stretch. At this point in time, a 35 mm vascular stapler was used to first ligate the renal artery followed by the renal vein. There was no bleeding or complication associated with the stapling of the hilum. The kidney was then further mobilized and the upper pole was eventually able to be freed. Of note, there was a significant number of perforating vessels both laterally and superiorly, which were controlled using a combination of monopolar and bipolar cautery as well as Weck clip as needed. The tail of Gerota's was then freed up until the kidney was completely free from its attachments other than by ureter. The ureter was isolated using a vessel loop and a Weck clip. The ureter was then traced inferiorly as it crossed over the iliac vessels down into the deep pelvis. Some small periureteral vessels were controlled and the dissection was carried all the way down to the bladder cuff. The detrusor was opened around the ureter that entered the trigone such that a bulge mucosa could be seen at the UVJ. The bladder was then filled with 200 mL of saline and the mucosa was noted to bulge quite nicely. At this point in time, a preplaced 4-0 Vicryl suture was used on the right lateral aspect of the UVJ just lateral to  where the planned transection of the ureter would occur as an anchor stitch. An additional Weck clip was applied at the very distal aspect of the ureter to avoid any tumor spillage. The ureter was then transected, taking a cuff of normal bladder mucosa with the margin. The bladder mucosa and detrusor were closed in a single running layer using that preplaced Vicryl suture; this appeared to be watertight at the was tied down. The bladder was emptied just prior to transection of the ureter. It was refilled with 200 mL of saline and no leak noted whatsoever. At this point in time, the kidney and ureter were placed in a large endoscopic bag, which was brought in using the 12 mm port incision near the umbilicus. The string was brought out through the skin. A 19 French round Blake drain was placed in the left lower lateral port site and placed within the dependent pelvis as a drain. The hilum was then reinspected as well as the renal fossa and splenorenal ligament. There was no active bleeding noted. Additional hemostasis was achieved using Surgicel near the hilum as well as Surgiflo. At this point in time,  the camera port was removed and closed using a 0 Vicryl tie aided by a Carter-Thomason needle. All remaining trocars were removed under direct visualization. There was no bleeding noted. The string was left coming out of the umbilical port site. She was then repositioned in the supine position and reprepped and draped. At this point in time, Dr. Bary Castilla performed a ventral hernia repair. Please see his dictation for details.   There were no complications in this case. All needle counts and instrument counts as well as laps were correct at the end of the case.     ____________________________ Sherlynn Stalls, MD ajb:bm D: 11/01/2014 18:44:00 ET T: 11/02/2014 05:37:02 ET JOB#: [id number]  cc: Sherlynn Stalls, MD, <Dictator> Sherlynn Stalls MD ELECTRONICALLY SIGNED 11/02/2014 16:50

## 2014-11-05 NOTE — Op Note (Signed)
PATIENT NAME:  Laura Reed, Laura Reed MR#:  [id number] DATE OF BIRTH:  11/06/1960  DATE OF PROCEDURE:  10/31/2014  PREOPERATIVE DIAGNOSIS: Ventral hernia.   POSTOPERATIVE DIAGNOSIS: Ventral hernia.  OPERATIVE PROCEDURE: Ventral hernia repair with ProLite mesh.   SURGEON: Robert Bellow, M.D.    ANESTHESIA: General endotracheal under Dr. Myra Gianotti.   ESTIMATED BLOOD LOSS: 25 mL.   CLINICAL NOTE: This 54 year old woman was seen in February of this year with report of a symptomatic ventral hernia. At that time, she reported noticing hematuria and a subsequent triphasic CT suggested a malignancy in the collecting system on the left. The patient has undergone a robotic left ureteronephrectomy by Dr. Erlene Quan, and is now felt to be a candidate for repair of her ventral hernia. The string from the bag encompassing the kidney and ureter had been brought out through the midline above the umbilicus.   Due to the profound positioning for nephrectomy, the patient was placed supine and the abdomen reprepped with ChloraPrep and draped. She had been redosed with antibiotics within 1 hour of the hernia repair portion of the program.   TED stockings and pneumatic compression stockings were used for DVT prevention.   The abdomen was draped and a midline incision made from above the umbilicus, approximately 6 cm, to expose the hernia sac which had a fascial defect of 3 cm. It was necessary to extend the incision through the umbilicus to free adhesions of the omentum to the anterior abdominal wall. The hernia sac was excised with cautery and discarded. The kidney was brought out through the enlarged ventral opening, and handed off and routinely sent for histology.   With a wet lap in the abdomen to protect the contents, the posterior rectus sheath was opened on both sides for the width of the rectus muscle. This was then approximated in the midline with a running 0 Vicryl suture having removed the lap pad previously  placed. An [employer]rium ProLite mesh was trimmed to a 10 x 10 cm size and smoothed into the retrorectus location. It was then transfixed to the lateral aspect of the anterior rectus sheath with transfascial sutures in 4 corners and at both the superior and inferior edge of the midline. The rectus fascia was then approximated with interrupted 0 PDS figure-of-eight sutures. The wound was irrigated. The adipose layer was closed in multiple layers of 2-0 Vicryl. The skin was then closed with staples. Dressings were applied and the patient was taken to the recovery room in stable condition.    ____________________________ Robert Bellow, MD jwb:JT D: 11/01/2014 08:49:42 ET T: 11/01/2014 11:57:02 ET JOB#: [id number]  cc: Robert Bellow, MD, <Dictator> Deborra Medina, MD Sherlynn Stalls, MD Zaidy Absher Amedeo Kinsman MD ELECTRONICALLY SIGNED 11/01/2014 20:06

## 2014-11-05 NOTE — Op Note (Signed)
PATIENT NAME:  Laura Reed, Laura Reed MR#:  [id number] DATE OF BIRTH:  [birth year]  DATE OF PROCEDURE:  09/11/2014  PREOPERATIVE DIAGNOSES: Left renal upper tract filling defect, gross hematuria.  POSTOPERATIVE DIAGNOSES: Left upper tract renal pelvis tumor, gross hematuria.   PROCEDURES PERFORMED:  Cystoscopy, left ureteroscopy, left renal pelvis biopsy of tumor, laser ablation of tumor, left ureteral stent placement.   ATTENDING SURGEON: Sherlynn Stalls, MD   ANESTHESIA: General anesthesia.   ESTIMATED BLOOD LOSS: Minimal.   DRAINS: A 6 x 24 French double-J ureteral stent on the left. No string in place.   SPECIMENS: Left renal pelvis tumor.   COMPLICATIONS: None.   INDICATION: This is a 54 year old former smoker, who developed gross hematuria as part of her workup. She underwent a CT urogram, which shows a large upper tract filling defect in the left upper renal pelvis. She was counseled to proceed to the operating room for cystoscopy to evaluate the bladder, left ureteroscopy to evaluate the upper tract with possible biopsy and laser ablation. Risks and benefits of the procedure were explained in detail. The patient agreed to proceed as planned.   PROCEDURE: The patient was correctly identified in the preoperative holding area and informed consent was confirmed. She was brought to the operating suite and placed on the table in a supine position. At this time, universal timeout protocol was performed. All team members were identified. Venodyne boots were placed and she was administered 500 mg of IV Levaquin in the perioperative period. She was then placed under general anesthesia, repositioned in dorsal lithotomy position and prepped and draped in a standard surgical fashion. At this point in time, a rigid cystoscope using a 30-degrees lens was advanced per urethra into the bladder. A formal cystoscopy was performed, which revealed a normal-appearing, smooth-walled bladder without any obvious  tumors or lesions. Bilateral ureteral orifices were identified in good position, the trigone was normal.  Attention was then turned to the left ureteral orifice, which was cannulated using a 5 [place] open-ended ureteral catheter. A retrograde pyelogram was performed, which revealed a large spherical filling defect in the upper portion of the left renal pelvis consistent with the images on the CT urogram suspicious for an upper tract tumor. A Sensor wire was then placed up to the level of the kidney along with a second wire as a safety wire, which was snapped in place. A 7 French flexible ureteroscope was then advanced over the working wire up to the level of the renal pelvis and the wire was removed. Formal pyeloscopy was performed at this point in time, at which time, a large spherical papillary tumor on a broad base was identified in the upper portion of the renal pelvis extending towards the upper complex infundibulum. The remaining calyces were directly visualized and there was no other tumor throughout the remainder of the kidney. A Gemini basket was then used to obtain a fairly significant sized biopsy of this tumor which was passed off as a specimen. The ureteroscope was then advanced over the wire, which was replaced up to the level of the renal pelvis and, at this point in time, I did go ahead and try to ablate this tumor by finding the base of the tumor. A 273 micron laser fiber was used using the settings of 0.6 joules and 10 Hz to ablate the base of the tumor. After some period of time, it was quite obvious that the tumor was much too large to be amenable to local ablation  and visualization became poor. The laser was then removed and the ureteroscope was backed down the entire length of the ureter under direct visualization to insure that there was no residual tumor throughout the entire length of the ureter. This was clear of any lesions or masses. A 6 x 24 French double-J ureteral stent was then  placed over the safety wire up to the level of the renal pelvis. The wire was partially withdrawn and a coil was noted within the renal pelvis. The wire was then fully withdrawn and a coil was noted within the bladder under fluoroscopy. A rigid cystoscope was then reintroduced back into the bladder noting a full coil and the bladder was then drained at this time. The patient was then repositioned in the supine position, reversed from anesthesia, and taken to the PACU in stable condition. There were no complications in this case.    ____________________________ Sherlynn Stalls, MD ajb:sw D: 09/11/2014 10:01:54 ET T: 09/11/2014 14:19:52 ET JOB#: [id number]  cc: Sherlynn Stalls, MD, <Dictator> Sherlynn Stalls MD ELECTRONICALLY SIGNED 10/11/2014 16:15

## 2014-11-05 NOTE — Discharge Summary (Signed)
Dates of Admission and Diagnosis:  Date of Admission 31-Oct-2014   Date of Discharge 02-Nov-2014   Admitting Diagnosis Left upper tract urothelial cancer   Final Diagnosis Left upper tract urothelial cancer   Discharge Diagnosis 1 Left upper tract urothelial cancer   2 Ventral hernia    Chief Complaint/History of Present Illness See admission H&P   Allergies:  No Known Allergies:   Routine Chem:  28-Apr-16 06:09   Glucose, Serum  108 (65-99 NOTE: New Reference Range  09/12/14)  BUN 20 (6-20 NOTE: New Reference Range  09/12/14)  Creatinine (comp)  1.21 (0.44-1.00 NOTE: New Reference Range  09/12/14)  Sodium, Serum 135 (135-145 NOTE: New Reference Range  09/12/14)  Potassium, Serum 4.2 (3.5-5.1 NOTE: New Reference Range  09/12/14)  Chloride, Serum 107 (101-111 NOTE: New Reference Range  09/12/14)  CO2, Serum 23 (22-32 NOTE: New Reference Range  09/12/14)  Calcium (Total), Serum  7.3 (8.9-10.3 NOTE: New Reference Range  09/12/14)  Anion Gap  5  eGFR (African American)  58  eGFR (Non-African American)  50 (eGFR values <43mL/min/1.73 m2 may be an indication of chronic kidney disease (CKD). Calculated eGFR is useful in patients with stable renal function. The eGFR calculation will not be reliable in acutely ill patients when serum creatinine is changing rapidly. It is not useful in patients on dialysis. The eGFR calculation may not be applicable to patients at the low and high extremes of body sizes, pregnant women, and vegetarians.)  Routine Hem:  28-Apr-16 06:09   WBC (CBC)  12.8  RBC (CBC)  3.38  Hemoglobin (CBC)  9.5  Hematocrit (CBC)  29.5  Platelet Count (CBC) 217  MCV 87  MCH 28.0  MCHC 32.2  RDW 13.6  Neutrophil % 78.9  Lymphocyte % 9.3  Monocyte % 8.8  Eosinophil % 2.6  Basophil % 0.4  Neutrophil #  10.1  Lymphocyte # 1.2  Monocyte #  1.1  Eosinophil # 0.3  Basophil # 0.0 (Result(s) reported on 02 Nov 2014 at 07:12AM.)   Pertinent Past  History:  Pertinent Past History see admission H&P   Hospital Course:  Hospital Course Patient was admitted following left robotic nephroureterctomy, ventral hernia repair.   Her Foley remaining in place.  Her diet was advance and pain was controlled.  She was discharged home on POD2 in good condition.   Condition on Discharge Good   Code Status:  Code Status Full Code   DISCHARGE INSTRUCTIONS HOME MEDS:  Medication Reconciliation: Patient's Home Medications at Discharge:     Medication Instructions  digoxin 125 mcg (0.125 mg) oral tablet  1 tab(s) orally once a day (at bedtime)    gabapentin 600 mg oral tablet  1 tab(s) orally 3 times a day   aspirin 81 mg oral tablet  1 tab(s) orally once a day (in the morning)   [facility] therapeutic multiple vitamins with minerals oral tablet  1 tab(s) orally once a day   atorvastatin 20 mg oral tablet  1 tab(s) orally once a day (at bedtime)   biotin 1000 mcg oral tablet  1 tab(s) orally once a day   plavix 75 mg oral tablet  1 tab(s) orally once a day   levemir 100 units/ml subcutaneous solution  30 unit(s) subcutaneous once a day (at bedtime)   vitamin d3 2000 intl units oral tablet  1 tab(s) orally once a day (at bedtime)   xanax 0.5 mg oral tablet  1 tab(s) orally 3 times a day, As  Needed   vitamin c 500 mg oral tablet  2 tab(s) orally 2 times a day   lisinopril 5 mg oral tablet  1 tab(s) orally once a day (at bedtime)   humalog mix 75/25 pen  36 unit(s) subcutaneous 2 times a day, morning and evening   acetaminophen-oxycodone 325 mg-5 mg oral tablet  1 tab(s) orally every 6 hours, As Needed - for Pain   oxybutynin 5 mg oral tablet  1 tab(s) orally 3 times a day, As Needed - for Bladder Spasms   colace sodium 100 mg oral capsule  1 cap(s) orally 2 times a day    PRESCRIPTIONS: PRINTED AND PLACED ON CHART   Physician's Instructions:  Home Health? No   Treatments None   Dressing Care Staples are in place to close your incision.   Keep this area clean and dry.  Keep the bandage in place unless it becomes loose or soiled.  You may shower   Home Oxygen? No   Diet Diabetic diet   Dietary Supplements None   Diet Consistency Regular Consistency   Activity Limitations No exertional activity  No heavy lifting  6 weeks   Return to Work after follow up visit with MD   Time frame for Follow Up Appointment 1-2 weeks  Urology for Foley removal, staple removal     Hollice Espy J(Attending Physician): [place], [address] [place], [place], [place] [address], [place] [phone]  Electronic Signatures: Sherlynn Stalls (MD)  (Signed 28-Apr-16 15:36)  Authored: ADMISSION DATE AND DIAGNOSIS, CHIEF COMPLAINT/HPI, Allergies, PERTINENT LABS, PERTINENT PAST HISTORY, HOSPITAL COURSE, Vici, PATIENT INSTRUCTIONS, Follow Up Physician   Last Updated: 28-Apr-16 15:36 by Sherlynn Stalls (MD)

## 2014-11-08 ENCOUNTER — Telehealth: Payer: Self-pay | Admitting: *Deleted

## 2014-11-08 NOTE — Telephone Encounter (Signed)
Left message for patient to call the office back to schedule a post op visit for next week (Week of the 9th) for ventral hernia repair with Dr.Byrnett

## 2014-11-10 DIAGNOSIS — C642 Malignant neoplasm of left kidney, except renal pelvis: Secondary | ICD-10-CM | POA: Diagnosis not present

## 2014-11-15 ENCOUNTER — Ambulatory Visit (INDEPENDENT_AMBULATORY_CARE_PROVIDER_SITE_OTHER): Payer: Medicare Other | Admitting: General Surgery

## 2014-11-15 ENCOUNTER — Encounter: Payer: Self-pay | Admitting: General Surgery

## 2014-11-15 VITALS — BP 138/70 | HR 82 | Resp 12 | Ht 67.0 in | Wt 164.0 lb

## 2014-11-15 DIAGNOSIS — K439 Ventral hernia without obstruction or gangrene: Secondary | ICD-10-CM

## 2014-11-15 NOTE — Progress Notes (Signed)
Patient ID: Laura Reed, female   DOB: 1961-05-10, 54 y.o.   MRN: [id number]  Chief Complaint  Patient presents with  . Routine Post Op    ventral hernia    HPI Laura Reed is a 54 y.o. female.  Here today for her postoperative visit, ventral hernia on 10-31-14. She states she is sore but improving.  HPI  Past Medical History  Diagnosis Date  . Diabetes mellitus   . Hypertension   . Hemorrhoid   . PVD (peripheral vascular disease)   . Cancer   . Urothelial carcinoma of kidney 10/31/2014    INVASIVE UROTHELIAL CARCINOMA, LOW GRADE. T1, Nx.    Past Surgical History  Procedure Laterality Date  . Coronary artery bypass graft  2009    3 vessel  . Arterial bypass surgry  2009, 2013 x 2    right leg , done in [place]  . Cesarean section    . Carotid endarterectomy  July 2015    Dr Delana Meyer  . Cholecystectomy  03-03-12    Porcelain gallbladder, gallstones,  Jami Ohlin  . Colonoscopy w/ biopsies  04/28/2012    Hyperplastic rectal polyps.  . Nephrectomy Left 10-31-14  . Hernia repair  10-31-14    ventral, retro-rectus atrium mesh    Family History  Problem Relation Age of Onset  . Cancer Mother 9    Lung Cancer  . Cancer Father 56    Lung Ca    Social History History  Substance Use Topics  . Smoking status: Former Smoker    Types: Cigarettes    Quit date: 03/30/2011  . Smokeless tobacco: Never Used  . Alcohol Use: No    No Known Allergies  Current Outpatient Prescriptions  Medication Sig Dispense Refill  . ACCU-CHEK AVIVA PLUS test strip CHECK BLOOD SUGAR TWO TO THREE TIMES A DAY 100 each 6  . ALPRAZolam (XANAX) 0.5 MG tablet Take 1 tablet (0.5 mg total) by mouth 3 (three) times daily as needed. 90 tablet 0  . aspirin 81 MG tablet Take 81 mg by mouth daily.    Marland Kitchen atorvastatin (LIPITOR) 20 MG tablet TAKE 1 TABLET BY MOUTH EVERY DAY 90 tablet 1  . Biotin 1000 MCG tablet Take 1,000 mcg by mouth 3 (three) times daily.    . Cholecalciferol (VITAMIN D3) 2000 UNITS  TABS Take 1 capsule by mouth daily.    . clopidogrel (PLAVIX) 75 MG tablet TAKE 1 TABLET BY MOUTH EVERY DAY 90 tablet 0  . DIGOX 125 MCG tablet TAKE 1 TABLET BY MOUTH EVERY DAY 90 tablet 0  . gabapentin (NEURONTIN) 600 MG tablet Take 1 tablet (600 mg total) by mouth 3 (three) times daily. 270 tablet 3  . Insulin Aspart Prot & Aspart (NOVOLOG MIX 70/30 FLEXPEN) (70-30) 100 UNIT/ML Pen Inject 36 units in the AM and 36 units in the afternoon 30 mL 5  . Insulin Lispro Prot & Lispro (HUMALOG 75/25 MIX) (75-25) 100 UNIT/ML Kwikpen 30 units in the am 36 units in the pm 30 mL 11  . LEVEMIR FLEXTOUCH 100 UNIT/ML Pen INJECT 30 UNITS UNDER THE SKIN EVERY NIGHT AT BEDTIME 15 mL 5  . lisinopril (PRINIVIL,ZESTRIL) 5 MG tablet Take 1 tablet (5 mg total) by mouth daily. 90 tablet 3  . vitamin C (ASCORBIC ACID) 500 MG tablet Take 1,000 mg by mouth 2 (two) times daily.     No current facility-administered medications for this visit.    Review of Systems Review of Systems  Constitutional:  Negative.   Respiratory: Negative.   Cardiovascular: Negative.     Blood pressure 138/70, pulse 82, resp. rate 12, height 5\' 7"  (1.702 m), weight 164 lb (74.39 kg).  Physical Exam Physical Exam  Neck: Neck supple.  Cardiovascular: Normal rate and regular rhythm.   Murmur heard.  Systolic murmur is present with a grade of 2/6  Pulmonary/Chest: Effort normal and breath sounds normal.  Abdominal: Soft.    Incision healing well, staple removed, steri strips applied.  Lymphadenopathy:    She has no cervical adenopathy.    Data Reviewed Pathology from the kidney showed T1, Nx carcinoma the left kidney, urothelial origin.   Assessment    Doing well status post robotic nephrectomy and ventral hernia repair.    Plan    Wound staples were removed by the nurse. The patient may increase her weight restriction to 10 pounds. Walking was encouraged. We'll plan for follow-up examination in one month.      Follow up  in one month.   PCP:  Mattie Marlin 11/15/2014, 1:24 PM

## 2014-11-15 NOTE — Patient Instructions (Signed)
The patient is aware to call back for any questions or concerns.  

## 2014-11-23 ENCOUNTER — Other Ambulatory Visit: Payer: Self-pay | Admitting: Internal Medicine

## 2014-11-24 DIAGNOSIS — E11351 Type 2 diabetes mellitus with proliferative diabetic retinopathy with macular edema: Secondary | ICD-10-CM | POA: Diagnosis not present

## 2014-11-24 DIAGNOSIS — E11359 Type 2 diabetes mellitus with proliferative diabetic retinopathy without macular edema: Secondary | ICD-10-CM | POA: Diagnosis not present

## 2014-11-27 DIAGNOSIS — E1140 Type 2 diabetes mellitus with diabetic neuropathy, unspecified: Secondary | ICD-10-CM | POA: Diagnosis not present

## 2014-11-27 DIAGNOSIS — B351 Tinea unguium: Secondary | ICD-10-CM | POA: Diagnosis not present

## 2014-11-27 DIAGNOSIS — L851 Acquired keratosis [keratoderma] palmaris et plantaris: Secondary | ICD-10-CM | POA: Diagnosis not present

## 2014-11-28 LAB — SURGICAL PATHOLOGY

## 2014-12-05 ENCOUNTER — Other Ambulatory Visit: Payer: Self-pay | Admitting: Internal Medicine

## 2014-12-06 ENCOUNTER — Ambulatory Visit (INDEPENDENT_AMBULATORY_CARE_PROVIDER_SITE_OTHER): Payer: Medicare Other | Admitting: *Deleted

## 2014-12-06 ENCOUNTER — Inpatient Hospital Stay: Admission: RE | Admit: 2014-12-06 | Payer: 59 | Source: Ambulatory Visit

## 2014-12-06 DIAGNOSIS — K439 Ventral hernia without obstruction or gangrene: Secondary | ICD-10-CM

## 2014-12-06 DIAGNOSIS — H2513 Age-related nuclear cataract, bilateral: Secondary | ICD-10-CM | POA: Diagnosis not present

## 2014-12-06 NOTE — Progress Notes (Signed)
Patient came in today for a wound check.  The wound is clean, with no signs of infection noted. Scar tissue at port site below umbilical area noted with firm knot beneath. Follow up as scheduled.

## 2014-12-06 NOTE — Patient Instructions (Signed)
The patient is aware to call back for any questions or concerns.  

## 2014-12-07 ENCOUNTER — Encounter: Payer: Self-pay | Admitting: *Deleted

## 2014-12-08 ENCOUNTER — Other Ambulatory Visit: Payer: Self-pay | Admitting: Internal Medicine

## 2014-12-11 ENCOUNTER — Ambulatory Visit (INDEPENDENT_AMBULATORY_CARE_PROVIDER_SITE_OTHER): Payer: Medicare Other | Admitting: General Surgery

## 2014-12-11 ENCOUNTER — Ambulatory Visit: Payer: Medicare Other

## 2014-12-11 ENCOUNTER — Encounter: Payer: Self-pay | Admitting: General Surgery

## 2014-12-11 VITALS — BP 140/72 | HR 76 | Resp 14 | Ht 67.0 in | Wt 164.0 lb

## 2014-12-11 DIAGNOSIS — I2510 Atherosclerotic heart disease of native coronary artery without angina pectoris: Secondary | ICD-10-CM | POA: Diagnosis not present

## 2014-12-11 DIAGNOSIS — G709 Myoneural disorder, unspecified: Secondary | ICD-10-CM | POA: Diagnosis not present

## 2014-12-11 DIAGNOSIS — K439 Ventral hernia without obstruction or gangrene: Secondary | ICD-10-CM

## 2014-12-11 DIAGNOSIS — E119 Type 2 diabetes mellitus without complications: Secondary | ICD-10-CM | POA: Diagnosis not present

## 2014-12-11 DIAGNOSIS — E780 Pure hypercholesterolemia: Secondary | ICD-10-CM | POA: Diagnosis not present

## 2014-12-11 DIAGNOSIS — I10 Essential (primary) hypertension: Secondary | ICD-10-CM | POA: Diagnosis not present

## 2014-12-11 DIAGNOSIS — S31109A Unspecified open wound of abdominal wall, unspecified quadrant without penetration into peritoneal cavity, initial encounter: Secondary | ICD-10-CM

## 2014-12-11 DIAGNOSIS — Z79899 Other long term (current) drug therapy: Secondary | ICD-10-CM | POA: Diagnosis not present

## 2014-12-11 DIAGNOSIS — I509 Heart failure, unspecified: Secondary | ICD-10-CM | POA: Diagnosis not present

## 2014-12-11 DIAGNOSIS — I739 Peripheral vascular disease, unspecified: Secondary | ICD-10-CM | POA: Diagnosis not present

## 2014-12-11 DIAGNOSIS — N289 Disorder of kidney and ureter, unspecified: Secondary | ICD-10-CM | POA: Diagnosis not present

## 2014-12-11 DIAGNOSIS — Z905 Acquired absence of kidney: Secondary | ICD-10-CM | POA: Diagnosis not present

## 2014-12-11 DIAGNOSIS — H2511 Age-related nuclear cataract, right eye: Secondary | ICD-10-CM | POA: Diagnosis not present

## 2014-12-11 DIAGNOSIS — Z8551 Personal history of malignant neoplasm of bladder: Secondary | ICD-10-CM | POA: Diagnosis not present

## 2014-12-11 DIAGNOSIS — Z794 Long term (current) use of insulin: Secondary | ICD-10-CM | POA: Diagnosis not present

## 2014-12-11 NOTE — Progress Notes (Signed)
Patient ID: MALYSSA MARIS, female   DOB: 23-Dec-1960, 54 y.o.   MRN: [id number]  Chief Complaint  Patient presents with  . Follow-up    drainage from port site    HPI CHESNI VOS is a 54 y.o. female here due to drainage from her surgical site from her ventral hernia repair. She states that it started draining some clear fluid on 12/08/14, but just slightly. She states that on 12/09/14 she noticed the drainage had a yellow cast to the color. She denies any pain or discomfort. HPI  Past Medical History  Diagnosis Date  . Diabetes mellitus   . Hypertension   . Hemorrhoid   . PVD (peripheral vascular disease)   . Cancer   . Urothelial carcinoma of kidney 10/31/2014    INVASIVE UROTHELIAL CARCINOMA, LOW GRADE. T1, Nx.  Marland Kitchen Heart murmur   . CHF (congestive heart failure)   . Coronary artery disease     Past Surgical History  Procedure Laterality Date  . Coronary artery bypass graft  2009    3 vessel  . Arterial bypass surgry  2009, 2013 x 2    right leg , done in [place]  . Cesarean section    . Carotid endarterectomy  July 2015    Dr Delana Meyer  . Cholecystectomy  03-03-12    Porcelain gallbladder, gallstones,  Marquon Alcala  . Colonoscopy w/ biopsies  04/28/2012    Hyperplastic rectal polyps.  . Nephrectomy Left 10-31-14  . Hernia repair  10-31-14    ventral, retro-rectus atrium mesh  . Amputation toe      Family History  Problem Relation Age of Onset  . Cancer Mother 15    Lung Cancer  . Cancer Father 59    Lung Ca    Social History History  Substance Use Topics  . Smoking status: Former Smoker    Types: Cigarettes    Quit date: 03/30/2011  . Smokeless tobacco: Never Used  . Alcohol Use: No    No Known Allergies  Current Outpatient Prescriptions  Medication Sig Dispense Refill  . ACCU-CHEK AVIVA PLUS test strip CHECK BLOOD SUGAR TWO TO THREE TIMES A DAY 100 each 6  . ALPRAZolam (XANAX) 0.5 MG tablet Take 1 tablet (0.5 mg total) by mouth 3 (three) times daily as  needed. 90 tablet 0  . aspirin 81 MG tablet Take 81 mg by mouth daily.    Marland Kitchen atorvastatin (LIPITOR) 20 MG tablet TAKE 1 TABLET BY MOUTH EVERY DAY 90 tablet 1  . B-D ULTRAFINE III SHORT PEN 31G X 8 MM MISC TEST BLOOD SUGAR TWICE DAILY 100 each 5  . Biotin 1000 MCG tablet Take 1,000 mcg by mouth 3 (three) times daily.    . Cholecalciferol (VITAMIN D3) 2000 UNITS TABS Take 1 capsule by mouth daily.    . clopidogrel (PLAVIX) 75 MG tablet TAKE 1 TABLET BY MOUTH EVERY DAY 90 tablet 0  . DIGOX 125 MCG tablet TAKE 1 TABLET BY MOUTH EVERY DAY 90 tablet 1  . gabapentin (NEURONTIN) 600 MG tablet Take 1 tablet (600 mg total) by mouth 3 (three) times daily. 270 tablet 3  . Insulin Aspart Prot & Aspart (NOVOLOG MIX 70/30 FLEXPEN) (70-30) 100 UNIT/ML Pen Inject 36 units in the AM and 36 units in the afternoon 30 mL 5  . Insulin Lispro Prot & Lispro (HUMALOG 75/25 MIX) (75-25) 100 UNIT/ML Kwikpen 30 units in the am 36 units in the pm 30 mL 11  . LEVEMIR FLEXTOUCH 100  UNIT/ML Pen INJECT 30 UNITS UNDER THE SKIN EVERY NIGHT AT BEDTIME 15 mL 5  . lisinopril (PRINIVIL,ZESTRIL) 5 MG tablet Take 1 tablet (5 mg total) by mouth daily. 90 tablet 3  . vitamin C (ASCORBIC ACID) 500 MG tablet Take 1,000 mg by mouth 2 (two) times daily.     No current facility-administered medications for this visit.    Review of Systems Review of Systems  Blood pressure 140/72, pulse 76, resp. rate 14, height 5\' 7"  (1.702 m), weight 164 lb (74.39 kg).  Physical Exam Physical Exam  Abdominal:  Small pinpoint area of drainage at the inferior aspect of the midline incision, suggestive of some fluctuance below this.      Assessment    Wound drainage post ventral hernia repair.    Plan    Area was cleansed with alcohol and 3 mL of 1% Xylocaine, plain was used for local anesthesia. A once 0.5 cm incision was made with out evidence of any deep loculated fluid. Probing with a Q-tip showed no loculated fluid. Culture was obtained.  Dry dressing applied. The procedure was well tolerated.  We'll follow-up in 2 days with the nurse for wound check and physician exam in one week. We will hold anabiotic spending culture results.       Robert Bellow 12/12/2014, 9:36 AM

## 2014-12-12 DIAGNOSIS — S31109A Unspecified open wound of abdominal wall, unspecified quadrant without penetration into peritoneal cavity, initial encounter: Secondary | ICD-10-CM | POA: Insufficient documentation

## 2014-12-13 ENCOUNTER — Ambulatory Visit: Payer: Medicare Other | Admitting: *Deleted

## 2014-12-13 DIAGNOSIS — S31109A Unspecified open wound of abdominal wall, unspecified quadrant without penetration into peritoneal cavity, initial encounter: Secondary | ICD-10-CM

## 2014-12-13 NOTE — Progress Notes (Signed)
Patient came in today for a wound check.  The wound is clean, open area remains. Follow up as scheduled.

## 2014-12-13 NOTE — Patient Instructions (Signed)
The patient is aware to call back for any questions or concerns.  

## 2014-12-14 ENCOUNTER — Encounter: Payer: Self-pay | Admitting: *Deleted

## 2014-12-14 ENCOUNTER — Ambulatory Visit
Admission: RE | Admit: 2014-12-14 | Discharge: 2014-12-14 | Disposition: A | Discharge status: Home / Self Care | Payer: Medicare Other | Source: Ambulatory Visit | Attending: Ophthalmology | Admitting: Ophthalmology

## 2014-12-14 ENCOUNTER — Encounter
Admission: RE | Disposition: A | Discharge status: Home / Self Care | Payer: Self-pay | Source: Ambulatory Visit | Attending: Ophthalmology

## 2014-12-14 ENCOUNTER — Ambulatory Visit: Payer: Medicare Other | Admitting: Anesthesiology

## 2014-12-14 DIAGNOSIS — H2511 Age-related nuclear cataract, right eye: Secondary | ICD-10-CM | POA: Insufficient documentation

## 2014-12-14 DIAGNOSIS — I739 Peripheral vascular disease, unspecified: Secondary | ICD-10-CM | POA: Diagnosis not present

## 2014-12-14 DIAGNOSIS — Z8551 Personal history of malignant neoplasm of bladder: Secondary | ICD-10-CM | POA: Insufficient documentation

## 2014-12-14 DIAGNOSIS — G709 Myoneural disorder, unspecified: Secondary | ICD-10-CM | POA: Insufficient documentation

## 2014-12-14 DIAGNOSIS — I509 Heart failure, unspecified: Secondary | ICD-10-CM | POA: Diagnosis not present

## 2014-12-14 DIAGNOSIS — E780 Pure hypercholesterolemia: Secondary | ICD-10-CM | POA: Diagnosis not present

## 2014-12-14 DIAGNOSIS — N289 Disorder of kidney and ureter, unspecified: Secondary | ICD-10-CM | POA: Insufficient documentation

## 2014-12-14 DIAGNOSIS — I2510 Atherosclerotic heart disease of native coronary artery without angina pectoris: Secondary | ICD-10-CM | POA: Insufficient documentation

## 2014-12-14 DIAGNOSIS — E119 Type 2 diabetes mellitus without complications: Secondary | ICD-10-CM | POA: Diagnosis not present

## 2014-12-14 DIAGNOSIS — Z905 Acquired absence of kidney: Secondary | ICD-10-CM | POA: Diagnosis not present

## 2014-12-14 DIAGNOSIS — Z794 Long term (current) use of insulin: Secondary | ICD-10-CM | POA: Diagnosis not present

## 2014-12-14 DIAGNOSIS — Z79899 Other long term (current) drug therapy: Secondary | ICD-10-CM | POA: Insufficient documentation

## 2014-12-14 DIAGNOSIS — I10 Essential (primary) hypertension: Secondary | ICD-10-CM | POA: Insufficient documentation

## 2014-12-14 DIAGNOSIS — H2513 Age-related nuclear cataract, bilateral: Secondary | ICD-10-CM | POA: Diagnosis not present

## 2014-12-14 HISTORY — DX: Heart failure, unspecified: I50.9

## 2014-12-14 HISTORY — DX: Cardiac murmur, unspecified: R01.1

## 2014-12-14 HISTORY — DX: Atherosclerotic heart disease of native coronary artery without angina pectoris: I25.10

## 2014-12-14 HISTORY — PX: CATARACT EXTRACTION W/PHACO: SHX586

## 2014-12-14 LAB — GLUCOSE, CAPILLARY: GLUCOSE-CAPILLARY: 158 mg/dL — AB (ref 65–99)

## 2014-12-14 SURGERY — PHACOEMULSIFICATION, CATARACT, WITH IOL INSERTION
Anesthesia: Monitor Anesthesia Care | Laterality: Right

## 2014-12-14 MED ORDER — CEFUROXIME OPHTHALMIC INJECTION 1 MG/0.1 ML
INJECTION | OPHTHALMIC | Status: DC | PRN
  Administered 2014-12-14: 0.1 mL via INTRACAMERAL

## 2014-12-14 MED ORDER — LIDOCAINE HCL (PF) 4 % IJ SOLN
INTRAMUSCULAR | Status: AC
  Filled 2014-12-14: qty 5

## 2014-12-14 MED ORDER — EPINEPHRINE HCL 1 MG/ML IJ SOLN
INTRAMUSCULAR | Status: AC
  Filled 2014-12-14: qty 1

## 2014-12-14 MED ORDER — MIDAZOLAM HCL 2 MG/2ML IJ SOLN
INTRAMUSCULAR | Status: DC | PRN
  Administered 2014-12-14: 1 mg via INTRAVENOUS
  Administered 2014-12-14: 0.5 mg via INTRAVENOUS
  Administered 2014-12-14: .5 mg via INTRAVENOUS

## 2014-12-14 MED ORDER — MOXIFLOXACIN HCL 0.5 % OP SOLN
1.0000 [drp] | OPHTHALMIC | Status: AC
  Administered 2014-12-14 (×3): 1 [drp] via OPHTHALMIC

## 2014-12-14 MED ORDER — TETRACAINE HCL 0.5 % OP SOLN
OPHTHALMIC | Status: AC
Start: 2014-12-14 — End: 2014-12-14
  Administered 2014-12-14: 1 [drp] via OPHTHALMIC
  Filled 2014-12-14: qty 2

## 2014-12-14 MED ORDER — LIDOCAINE HCL (PF) 1 % IJ SOLN
INTRAOCULAR | Status: DC | PRN
  Administered 2014-12-14: 12:00:00

## 2014-12-14 MED ORDER — CYCLOPENTOLATE HCL 2 % OP SOLN
OPHTHALMIC | Status: AC
  Administered 2014-12-14: 1 [drp] via OPHTHALMIC
  Filled 2014-12-14: qty 2

## 2014-12-14 MED ORDER — MOXIFLOXACIN HCL 0.5 % OP SOLN
OPHTHALMIC | Status: AC
  Administered 2014-12-14: 1 [drp] via OPHTHALMIC
  Filled 2014-12-14: qty 3

## 2014-12-14 MED ORDER — SODIUM CHLORIDE 0.9 % IV SOLN
INTRAVENOUS | Status: DC | PRN
  Administered 2014-12-14: 12:00:00 via INTRAVENOUS

## 2014-12-14 MED ORDER — NA CHONDROIT SULF-NA HYALURON 40-30 MG/ML IO SOLN
INTRAOCULAR | Status: AC
  Filled 2014-12-14: qty 0.5

## 2014-12-14 MED ORDER — FENTANYL CITRATE (PF) 100 MCG/2ML IJ SOLN
INTRAMUSCULAR | Status: DC | PRN
  Administered 2014-12-14: 50 ug via INTRAVENOUS

## 2014-12-14 MED ORDER — NA CHONDROIT SULF-NA HYALURON 40-30 MG/ML IO SOLN
INTRAOCULAR | Status: DC | PRN
  Administered 2014-12-14: 0.5 mL via INTRAOCULAR

## 2014-12-14 MED ORDER — PHENYLEPHRINE HCL 10 % OP SOLN
1.0000 [drp] | OPHTHALMIC | Status: AC
  Administered 2014-12-14 (×4): 1 [drp] via OPHTHALMIC

## 2014-12-14 MED ORDER — PHENYLEPHRINE HCL 10 % OP SOLN
OPHTHALMIC | Status: AC
  Administered 2014-12-14: 1 [drp] via OPHTHALMIC
  Filled 2014-12-14: qty 5

## 2014-12-14 MED ORDER — CYCLOPENTOLATE HCL 2 % OP SOLN
1.0000 [drp] | OPHTHALMIC | Status: AC
  Administered 2014-12-14 (×4): 1 [drp] via OPHTHALMIC

## 2014-12-14 MED ORDER — NA HYALUR & NA CHOND-NA HYALUR 0.4-0.35 ML IO KIT
PACK | INTRAOCULAR | Status: DC | PRN
  Administered 2014-12-14: .75 mL via INTRAOCULAR

## 2014-12-14 MED ORDER — BSS IO SOLN
INTRAOCULAR | Status: DC | PRN
  Administered 2014-12-14: 400 mL via INTRAOCULAR

## 2014-12-14 MED ORDER — TETRACAINE HCL 0.5 % OP SOLN
1.0000 [drp] | OPHTHALMIC | Status: DC | PRN
  Administered 2014-12-14: 1 [drp] via OPHTHALMIC

## 2014-12-14 MED ORDER — NA HYALUR & NA CHOND-NA HYALUR 0.55-0.5 ML IO KIT
PACK | INTRAOCULAR | Status: AC
  Filled 2014-12-14: qty 1.05

## 2014-12-14 MED ORDER — MOXIFLOXACIN HCL 0.5 % OP SOLN
OPHTHALMIC | Status: DC | PRN
  Administered 2014-12-14: 2 [drp]

## 2014-12-14 MED ORDER — CEFUROXIME OPHTHALMIC INJECTION 1 MG/0.1 ML
INJECTION | OPHTHALMIC | Status: AC
  Filled 2014-12-14: qty 0.1

## 2014-12-14 MED ORDER — CARBACHOL 0.01 % IO SOLN
INTRAOCULAR | Status: DC | PRN
  Administered 2014-12-14: 1.5 mL via INTRAOCULAR

## 2014-12-14 SURGICAL SUPPLY — 25 items
BSS 15ML ×3 IMPLANT
CUP MEDICINE 2OZ PLAST GRAD ST (MISCELLANEOUS) ×3 IMPLANT
GLOVE BIO SURGEON STRL SZ7 (GLOVE) ×3 IMPLANT
GLOVE SURG LX 6.5 MICRO (GLOVE) ×4
GLOVE SURG LX STRL 6.5 MICRO (GLOVE) ×2 IMPLANT
GOWN STRL REUS W/ TWL LRG LVL3 (GOWN DISPOSABLE) ×2 IMPLANT
GOWN STRL REUS W/TWL LRG LVL3 (GOWN DISPOSABLE) ×4
IOL ×3 IMPLANT
LENS IOL ACRSF MP 24.0 (Intraocular Lens) ×1 IMPLANT
LENS IOL ACRYSOF POST 24.0 (Intraocular Lens) ×3 IMPLANT
LENS IOL POST 25.0 (Intraocular Lens) IMPLANT
NEEDLE FILTER BLUNT 18X 1/2SAF (NEEDLE) ×2
NEEDLE FILTER BLUNT 18X1 1/2 (NEEDLE) ×1 IMPLANT
PACK CATARACT (MISCELLANEOUS) ×3 IMPLANT
PACK CATARACT BRASINGTON LX (MISCELLANEOUS) ×3 IMPLANT
PACK EYE AFTER SURG (MISCELLANEOUS) ×3 IMPLANT
SOL BSS BAG (MISCELLANEOUS) ×3
SOL PREP PVP 2OZ (MISCELLANEOUS) ×3
SOLUTION BSS BAG (MISCELLANEOUS) ×1 IMPLANT
SOLUTION PREP PVP 2OZ (MISCELLANEOUS) ×1 IMPLANT
SUT ETHILON 10 0 CS140 6 (SUTURE) ×3 IMPLANT
SYR 3ML LL SCALE MARK (SYRINGE) ×6 IMPLANT
SYR TB 1ML 27GX1/2 LL (SYRINGE) ×3 IMPLANT
WATER STERILE IRR 1000ML POUR (IV SOLUTION) ×3 IMPLANT
WIPE NON LINTING 3.25X3.25 (MISCELLANEOUS) ×3 IMPLANT

## 2014-12-14 NOTE — Op Note (Signed)
  12/14/2014  PRE-OP DIAGNOSIS: Cataract (ICD-10 H25.11) RIGHT EYE  Post operative diagnosis: Cataract (ICD-10 H25.11) RIGHT EYE  Procedure: Phacoemulsification with introcular lens [id number])   SURGEON: Surgeon(s) and Role:    * Lyla Glassing, MD - Primary  ANESTHESIA: Choice   ESTIMATED BLOOD LOSS: MINIMAL  COMPLICATIONS: Posterior capsular rent, no vitreous noted  OPERATIVE DESCRIPTION:   Therapeutic options were discussed with the patient preoperatively, including a discussion of risks and benefits of surgery.  Informed consent was obtained. A dilated fundus exam was performed within 6 months.   The patient was premedicated and brought to the operating room and placed on the operating table in the supine position.  Topical tetracaine was instilled.  After adequate anesthesia, the patient was prepped and draped in the usual fashion.  A wire lid speculum was inserted and the microscope was positioned.  A sideport was used to create a paracentesis site and a mixture of preservative-free lidocaine, BSS, and epinephrine was was instilled into the anterior chamber, followed by viscoelastic.  A clear corneal incision was created using a keratome blade.  Capsulorrhexis was then performed.  In situ phacoemulsification was performed.  Cortical material was removed with the irrigation-aspiration unit.  Viscoelastic was instilled to open the capsular bag. At this point, a small rent was noted in the posterior capsule. No vitreous was noted.  A 3-piece intraocular lens, model [id number] 24.0 diopters, was inserted and positioned into the ciliary sulcus.  Irrigation-aspiration was used to remove all viscoelastic. Intracameral cefuroxime was injected into the eye. Miostat was injected in the anterior chamber and the pupil constricted equally with no peaking. One 10-0 nylon suture was used to close the main wound. Wounds were checked for leakage and confirmed to be secure.  Lid speculum was removed and a  shield was placed over the eye.  Patient was returned to the recovery room in stable condition. IMPLANTS:   Implant Name Type Inv. Item Serial No. Manufacturer Lot No. LRB No. Used  IOL   [id number] ALCON  Right 1  IOL     [id number] ALCON   Right 1     Postoperative care and discharge medication counseling was discussed with the patient or the parents prior to discharge

## 2014-12-14 NOTE — Anesthesia Postprocedure Evaluation (Signed)
     Anesthesia Post-op Note  Patient: Laura Reed  Procedure(s) Performed: Procedure(s) with comments: CATARACT EXTRACTION PHACO AND INTRAOCULAR LENS PLACEMENT (IOC) (Right) - [place]   00:38.6              AP        7.1                   CDE  2.76  Anesthesia type:MAC  Patient location: PACU  Post pain: Pain level controlled  Post assessment: Post-op Vital signs reviewed, Patient's Cardiovascular Status Stable, Respiratory Function Stable, Patent Airway and No signs of Nausea or vomiting  Post vital signs: Reviewed and stable  Last Vitals:  Filed Vitals:   12/14/14 0959  BP: 99/47  Pulse: 73  Temp: 36.9 C  Resp: 16    Level of consciousness: awake, alert  and patient cooperative  Complications: No apparent anesthesia complications

## 2014-12-14 NOTE — Anesthesia Postprocedure Evaluation (Signed)
  Anesthesia Post-op Note  Patient: Laura Reed  Procedure(s) Performed: Procedure(s) with comments: CATARACT EXTRACTION PHACO AND INTRAOCULAR LENS PLACEMENT (IOC) (Right) - [place]   00:38.6              AP        7.1                   CDE  2.76  Anesthesia type:MAC  Patient location: PACU  Post pain: Pain level controlled  Post assessment: Post-op Vital signs reviewed, Patient's Cardiovascular Status Stable, Respiratory Function Stable, Patent Airway and No signs of Nausea or vomiting  Post vital signs: Reviewed and stable  Last Vitals:  Filed Vitals:   12/14/14 1259  BP: 103/39  Pulse:   Temp: 35.9 C  Resp:     Level of consciousness: awake, alert  and patient cooperative  Complications: No apparent anesthesia complications

## 2014-12-14 NOTE — Anesthesia Procedure Notes (Signed)
Procedure Name: MAC Performed by: POPEJoelene Millin Pre-anesthesia Checklist: Patient identified, Emergency Drugs available, Suction available, Patient being monitored and Timeout performed Patient Re-evaluated:Patient Re-evaluated prior to inductionOxygen Delivery Method: Nasal cannula

## 2014-12-14 NOTE — Discharge Instructions (Signed)
Post Operative Instructions  Drop Schedule: Place 1 drop of Durezol in operative eye twice daily Place 1 drop of Ilevro in operative eye once daily Place 1 drop of Vigamox in operative eye four times daily Take one tablet of Acetazolamide twice daily, starting today  USE ONLY ONE DROP AT A TIME, WAIT 5 MINUTES BETWEEN DROPS  Eye Protection:  - Do not  rub or squeeze your eyes for one full week. - no strenuous activity or lifting more than 20 pounds during the first week - no hot tubs/saunas/swimming pools for two weeks after surgery - do not get water into your eye (washing your face is permitted but avoid water directly into the eye) - Ensure that you wear the eye shield every night for the first week after surgery   When to call:  It is unusual to have pain following cataract surgery. Pain that is not relieved with Tylenol or Ibuprofen is a reason to call immediately. Vision gradually clears during the first days after surgery; however vision should not get worse or darken. Please call with any unusual symptoms. lights, new floaters, or a curtain over the vision should prompt a phone call.    AMBULATORY SURGERY  DISCHARGE INSTRUCTIONS   1) The drugs that you were given will stay in your system until tomorrow so for the next 24 hours you should not:  A) Drive an automobile B) Make any legal decisions C) Drink any alcoholic beverage   2) You may resume regular meals tomorrow.  Today it is better to start with liquids and gradually work up to solid foods.  You may eat anything you prefer, but it is better to start with liquids, then soup and crackers, and gradually work up to solid foods.   3) Please notify your doctor immediately if you have any unusual bleeding, trouble breathing, redness and pain at the surgery site, drainage, fever, or pain not relieved by medication.  4) Your post-operative visit with Dr.                                     is: Date:                         Time:    Please call to schedule your post-operative visit.  5) Additional Instructions: 6)

## 2014-12-14 NOTE — Anesthesia Preprocedure Evaluation (Signed)
Anesthesia Evaluation  Patient identified by MRN, date of birth, ID band Patient awake    Reviewed: Allergy & Precautions, NPO status , Patient's Chart, lab work & pertinent test results  Airway Mallampati: II  TM Distance: >3 FB Neck ROM: Full    Dental  (+) Upper Dentures, Lower Dentures   Pulmonary former smoker (quit x 4 yrs),          Cardiovascular hypertension, Pt. on medications + CAD (s/p CABG), + Peripheral Vascular Disease and +CHF (7 yrs ago, s/p surgery) + Valvular Problems/Murmurs (hx, no tx)     Neuro/Psych  Neuromuscular disease (peripheral neuropathy)    GI/Hepatic   Endo/Other  diabetes, Type 2, Insulin Dependent  Renal/GU Renal disease (L nephrectomy)     Musculoskeletal   Abdominal   Peds  Hematology   Anesthesia Other Findings   Reproductive/Obstetrics                             Anesthesia Physical Anesthesia Plan  ASA: III  Anesthesia Plan: MAC   Post-op Pain Management:    Induction: Intravenous  Airway Management Planned: Nasal Cannula  Additional Equipment:   Intra-op Plan:   Post-operative Plan:   Informed Consent: I have reviewed the patients History and Physical, chart, labs and discussed the procedure including the risks, benefits and alternatives for the proposed anesthesia with the patient or authorized representative who has indicated his/her understanding and acceptance.     Plan Discussed with:   Anesthesia Plan Comments:         Anesthesia Quick Evaluation

## 2014-12-14 NOTE — Transfer of Care (Signed)
Immediate Anesthesia Transfer of Care Note  Patient: Laura Reed  Procedure(s) Performed: Procedure(s) with comments: CATARACT EXTRACTION PHACO AND INTRAOCULAR LENS PLACEMENT (IOC) (Right) - [place]   00:38.6              AP        7.1                   CDE  2.76  Patient Location: PACU  Anesthesia Type:MAC  Level of Consciousness: awake, oriented and patient cooperative  Airway & Oxygen Therapy: Patient Spontanous Breathing and Patient connected to nasal cannula oxygen  Post-op Assessment: Report given to RN and Post -op Vital signs reviewed and stable  Post vital signs: Reviewed and stable  Last Vitals:  Filed Vitals:   12/14/14 0959  BP: 99/47  Pulse: 73  Temp: 36.9 C  Resp: 16    Complications: No apparent anesthesia complications

## 2014-12-15 ENCOUNTER — Other Ambulatory Visit: Payer: Self-pay | Admitting: General Surgery

## 2014-12-15 ENCOUNTER — Telehealth: Payer: Self-pay | Admitting: General Surgery

## 2014-12-15 DIAGNOSIS — IMO0001 Reserved for inherently not codable concepts without codable children: Secondary | ICD-10-CM

## 2014-12-15 DIAGNOSIS — T814XXA Infection following a procedure, initial encounter: Principal | ICD-10-CM

## 2014-12-15 MED ORDER — LEVOFLOXACIN 500 MG PO TABS: 500.0000 mg | ORAL_TABLET | Freq: Every day | ORAL | Status: AC

## 2014-12-15 NOTE — Progress Notes (Signed)
Culture results return today showed scant growth of Pseudomonas. Sensitive to all tested and a biotics. Last estimated GFR was 50 mL/m. We'll Rx with Levaquin 500 mg daily 2 weeks.

## 2014-12-15 NOTE — Telephone Encounter (Signed)
The patient was notified that the culture did show light growth of organisms which she will be treated with Levaquin. She reports doing well since Estrace cataract extraction. Follow-up on June 14 as previously scheduled.

## 2014-12-17 LAB — ANAEROBIC AND AEROBIC CULTURE

## 2014-12-18 ENCOUNTER — Encounter: Payer: Self-pay | Admitting: Ophthalmology

## 2014-12-19 ENCOUNTER — Encounter: Payer: Self-pay | Admitting: General Surgery

## 2014-12-19 ENCOUNTER — Ambulatory Visit (INDEPENDENT_AMBULATORY_CARE_PROVIDER_SITE_OTHER): Payer: Medicare Other | Admitting: General Surgery

## 2014-12-19 VITALS — BP 98/60 | HR 68 | Resp 14 | Ht 66.5 in | Wt 164.0 lb

## 2014-12-19 DIAGNOSIS — K439 Ventral hernia without obstruction or gangrene: Secondary | ICD-10-CM

## 2014-12-19 DIAGNOSIS — S31109A Unspecified open wound of abdominal wall, unspecified quadrant without penetration into peritoneal cavity, initial encounter: Secondary | ICD-10-CM

## 2014-12-19 NOTE — Progress Notes (Signed)
Patient ID: Laura Reed, female   DOB: 14-Feb-1961, 54 y.o.   MRN: [id number]  Chief Complaint  Patient presents with  . Other    Post op ventral hernia    HPI Laura Reed is a 54 y.o. female here today for a post operation for a  ventral hernia done on 10/31/14. Patient states she is doing well. She states there is still some drainage. She is currently on an antibiotic. She has recovered form the right cataract surgery and is doing well.  HPI  Past Medical History  Diagnosis Date  . Diabetes mellitus   . Hypertension   . Hemorrhoid   . PVD (peripheral vascular disease)   . Cancer   . Urothelial carcinoma of kidney 10/31/2014    INVASIVE UROTHELIAL CARCINOMA, LOW GRADE. T1, Nx.  Marland Kitchen Heart murmur   . CHF (congestive heart failure)   . Coronary artery disease     Past Surgical History  Procedure Laterality Date  . Coronary artery bypass graft  2009    3 vessel  . Arterial bypass surgry  2009, 2013 x 2    right leg , done in [place]  . Cesarean section    . Carotid endarterectomy  July 2015    Dr Delana Meyer  . Cholecystectomy  03-03-12    Porcelain gallbladder, gallstones,  Anny Sayler  . Colonoscopy w/ biopsies  04/28/2012    Hyperplastic rectal polyps.  . Nephrectomy Left 10-31-14  . Hernia repair  10-31-14    ventral, retro-rectus atrium mesh  . Amputation toe    . Cataract extraction w/phaco Right 12/14/2014    Procedure: CATARACT EXTRACTION PHACO AND INTRAOCULAR LENS PLACEMENT (IOC);  Surgeon: Lyla Glassing, MD;  Location: [facility] ORS;  Service: Ophthalmology;  Laterality: Right;  [place]   00:38.6              AP        7.1                   CDE  2.76    Family History  Problem Relation Age of Onset  . Cancer Mother 80    Lung Cancer  . Cancer Father 56    Lung Ca    Social History History  Substance Use Topics  . Smoking status: Former Smoker    Types: Cigarettes    Quit date: 03/30/2011  . Smokeless tobacco: Never Used  . Alcohol Use: No    No Known  Allergies  Current Outpatient Prescriptions  Medication Sig Dispense Refill  . ACCU-CHEK AVIVA PLUS test strip CHECK BLOOD SUGAR TWO TO THREE TIMES A DAY 100 each 6  . ALPRAZolam (XANAX) 0.5 MG tablet Take 1 tablet (0.5 mg total) by mouth 3 (three) times daily as needed. (Patient taking differently: Take 0.5 mg by mouth 3 (three) times daily as needed for anxiety. ) 90 tablet 0  . aspirin 81 MG chewable tablet Chew 81 mg by mouth daily.    Marland Kitchen atorvastatin (LIPITOR) 20 MG tablet TAKE 1 TABLET BY MOUTH EVERY DAY 90 tablet 1  . B-D ULTRAFINE III SHORT PEN 31G X 8 MM MISC TEST BLOOD SUGAR TWICE DAILY 100 each 5  . Biotin 1000 MCG tablet Take 1,000 mcg by mouth daily.     . Cholecalciferol (VITAMIN D3) 2000 UNITS TABS Take 1 tablet by mouth daily.     . clopidogrel (PLAVIX) 75 MG tablet TAKE 1 TABLET BY MOUTH EVERY DAY 90 tablet 0  . DIGOX 125  MCG tablet TAKE 1 TABLET BY MOUTH EVERY DAY 90 tablet 1  . DUREZOL 0.05 % EMUL BEGINNING AFTER SURGERY INSTILL 1 DROP BID IN OPERATIVE EYE  0  . gabapentin (NEURONTIN) 600 MG tablet Take 1 tablet (600 mg total) by mouth 3 (three) times daily. 270 tablet 3  . ILEVRO 0.3 % ophthalmic suspension BEGINNING AFTER SURGERY INSTILL 1 DROP A DAY IN OPERATIVE EYE  0  . Insulin Lispro Prot & Lispro (HUMALOG 75/25 MIX) (75-25) 100 UNIT/ML Kwikpen 30 units in the am 36 units in the pm (Patient taking differently: Inject 30-36 Units into the skin 2 (two) times daily. Pt uses 30 units in the morning and 36 units in the evening.) 30 mL 11  . LEVEMIR FLEXTOUCH 100 UNIT/ML Pen INJECT 30 UNITS UNDER THE SKIN EVERY NIGHT AT BEDTIME 15 mL 5  . levofloxacin (LEVAQUIN) 500 MG tablet Take 1 tablet (500 mg total) by mouth daily. 14 tablet 0  . lisinopril (PRINIVIL,ZESTRIL) 5 MG tablet Take 1 tablet (5 mg total) by mouth daily. 90 tablet 3  . vitamin C (ASCORBIC ACID) 500 MG tablet Take 1,000 mg by mouth 2 (two) times daily.     No current facility-administered medications for this  visit.    Review of Systems Review of Systems  All other systems reviewed and are negative.   Blood pressure 98/60, pulse 68, resp. rate 14, height 5' 6.5" (1.689 m), weight 164 lb (74.39 kg).  Physical Exam Physical Exam  Abdominal:      Data Reviewed Comments: Pseudomonas aeruginosa  Scant growth     Antimicrobial Susceptibility Comment   Comments:   ** S = Susceptible; I = Intermediate; R = Resistant **           P = Positive; N = Negative        MICS are expressed in micrograms per mL   Antibiotic         RSLT#1  RSLT#2  RSLT#3  RSLT#4  Amikacin            S  Cefepime            S  Ceftazidime          S  Ciprofloxacin         S  Gentamicin           S  Imipenem            S  Levofloxacin          S  Meropenem           S  Piperacillin          S  Ticarcillin          S  Tobramycin           S            Assessment    Superficial wound infection.    Plan    The patient is tolerating the prescribed Levaquin without difficulty. It was elected to place a small Penrose drain in the base of the wound to minimize loculated fluid accumulation. 3 mL of 0.5% Xylocaine with 0.25% Marcaine with 1-200,000 of epinephrine was instilled. A quarter inch Penrose drain 5 cm in length was placed in the base of the wound. This was anchored in position with 3-0 nylon. A dry dressing was applied. The procedure was well tolerated. The patient may shower with the wound covered, she's been instructed to apply a fresh Band-Aid  afterwards. Complete her present course of Levaquin.  We'll plan for follow-up examination in one week.     [id number]  Robert Bellow 12/20/2014, 8:50 PM

## 2014-12-19 NOTE — Patient Instructions (Signed)
The patient is aware to call back for any questions or concerns. Change dressing as needed

## 2014-12-27 ENCOUNTER — Ambulatory Visit: Payer: Medicare Other | Admitting: General Surgery

## 2014-12-27 ENCOUNTER — Encounter: Payer: Self-pay | Admitting: General Surgery

## 2014-12-27 VITALS — BP 138/70 | HR 74 | Resp 13 | Ht 66.0 in | Wt 167.0 lb

## 2014-12-27 DIAGNOSIS — S31109D Unspecified open wound of abdominal wall, unspecified quadrant without penetration into peritoneal cavity, subsequent encounter: Secondary | ICD-10-CM

## 2014-12-27 NOTE — Progress Notes (Signed)
Patient ID: Laura Reed, female   DOB: 1961/03/24, 54 y.o.   MRN: [id number]  Chief Complaint  Patient presents with  . Routine Post Op    ventral hernia and infection    HPI Laura Reed is a 54 y.o. female here for follow up due to drainage from her surgical site from her ventral hernia repair done on 12/14/14. She states that the area is still draining and she is only changing the dressing once daily. She reports no chills or fevers.  HPI  Past Medical History  Diagnosis Date  . Diabetes mellitus   . Hypertension   . Hemorrhoid   . PVD (peripheral vascular disease)   . Cancer   . Urothelial carcinoma of kidney 10/31/2014    INVASIVE UROTHELIAL CARCINOMA, LOW GRADE. T1, Nx.  Marland Kitchen Heart murmur   . CHF (congestive heart failure)   . Coronary artery disease     Past Surgical History  Procedure Laterality Date  . Coronary artery bypass graft  2009    3 vessel  . Arterial bypass surgry  2009, 2013 x 2    right leg , done in [place]  . Cesarean section    . Carotid endarterectomy  July 2015    Dr Delana Meyer  . Cholecystectomy  03-03-12    Porcelain gallbladder, gallstones,  [place]  . Colonoscopy w/ biopsies  04/28/2012    Hyperplastic rectal polyps.  . Nephrectomy Left 10-31-14  . Hernia repair  10-31-14    ventral, retro-rectus atrium mesh  . Amputation toe    . Cataract extraction w/phaco Right 12/14/2014    Procedure: CATARACT EXTRACTION PHACO AND INTRAOCULAR LENS PLACEMENT (IOC);  Surgeon: Lyla Glassing, MD;  Location: [facility] ORS;  Service: Ophthalmology;  Laterality: Right;  [place]   00:38.6              AP        7.1                   CDE  2.76    Family History  Problem Relation Age of Onset  . Cancer Mother 33    Lung Cancer  . Cancer Father 37    Lung Ca    Social History History  Substance Use Topics  . Smoking status: Former Smoker    Types: Cigarettes    Quit date: 03/30/2011  . Smokeless tobacco: Never Used  . Alcohol Use: No    No Known  Allergies  Current Outpatient Prescriptions  Medication Sig Dispense Refill  . ALPRAZolam (XANAX) 0.5 MG tablet Take 1 tablet (0.5 mg total) by mouth 3 (three) times daily as needed. (Patient taking differently: Take 0.5 mg by mouth 3 (three) times daily as needed for anxiety. ) 90 tablet 0  . aspirin 81 MG chewable tablet Chew 81 mg by mouth daily.    Marland Kitchen atorvastatin (LIPITOR) 20 MG tablet TAKE 1 TABLET BY MOUTH EVERY DAY 90 tablet 1  . B-D ULTRAFINE III SHORT PEN 31G X 8 MM MISC TEST BLOOD SUGAR TWICE DAILY 100 each 5  . Biotin 1000 MCG tablet Take 1,000 mcg by mouth daily.     . Cholecalciferol (VITAMIN D3) 2000 UNITS TABS Take 1 tablet by mouth daily.     . clopidogrel (PLAVIX) 75 MG tablet TAKE 1 TABLET BY MOUTH EVERY DAY 90 tablet 0  . DIGOX 125 MCG tablet TAKE 1 TABLET BY MOUTH EVERY DAY 90 tablet 1  . DUREZOL 0.05 % EMUL BEGINNING AFTER  SURGERY INSTILL 1 DROP BID IN OPERATIVE EYE  0  . gabapentin (NEURONTIN) 600 MG tablet Take 1 tablet (600 mg total) by mouth 3 (three) times daily. 270 tablet 3  . ILEVRO 0.3 % ophthalmic suspension BEGINNING AFTER SURGERY INSTILL 1 DROP A DAY IN OPERATIVE EYE  0  . Insulin Lispro Prot & Lispro (HUMALOG 75/25 MIX) (75-25) 100 UNIT/ML Kwikpen 30 units in the am 36 units in the pm (Patient taking differently: Inject 30-36 Units into the skin 2 (two) times daily. Pt uses 30 units in the morning and 36 units in the evening.) 30 mL 11  . LEVEMIR FLEXTOUCH 100 UNIT/ML Pen INJECT 30 UNITS UNDER THE SKIN EVERY NIGHT AT BEDTIME 15 mL 5  . lisinopril (PRINIVIL,ZESTRIL) 5 MG tablet Take 1 tablet (5 mg total) by mouth daily. 90 tablet 3  . vitamin C (ASCORBIC ACID) 500 MG tablet Take 1,000 mg by mouth 2 (two) times daily.     No current facility-administered medications for this visit.    Review of Systems Review of Systems  Constitutional: Negative.   Respiratory: Negative.   Cardiovascular: Negative.     Blood pressure 138/70, pulse 74, resp. rate 13,  height 5\' 6"  (1.676 m), weight 167 lb (75.751 kg).  Physical Exam Physical Exam  Constitutional: She is oriented to person, place, and time. She appears well-developed and well-nourished.  Abdominal:    Neurological: She is alert and oriented to person, place, and time.  Skin: Skin is warm and dry.      Assessment    Ventral hernia repair with superficial wound infection, improved with conservative therapy and/or a lot of bionics.    Plan    The patient will report if there is any change in the wound appearance. Follow up otherwise will be in one week.    [id number]   Robert Bellow 12/28/2014, 8:29 PM

## 2014-12-27 NOTE — Patient Instructions (Signed)
Keep area covered.

## 2015-01-04 ENCOUNTER — Ambulatory Visit: Payer: Medicare Other | Admitting: General Surgery

## 2015-01-04 ENCOUNTER — Ambulatory Visit: Payer: 59 | Admitting: Internal Medicine

## 2015-01-04 ENCOUNTER — Encounter: Payer: Self-pay | Admitting: General Surgery

## 2015-01-04 VITALS — BP 124/68 | HR 74 | Resp 12 | Ht 66.0 in | Wt 166.0 lb

## 2015-01-04 DIAGNOSIS — S31109D Unspecified open wound of abdominal wall, unspecified quadrant without penetration into peritoneal cavity, subsequent encounter: Secondary | ICD-10-CM

## 2015-01-04 NOTE — Progress Notes (Signed)
Patient ID: Laura Reed, female   DOB: [birth year], 54 y.o.   MRN: [id number]  Chief Complaint  Patient presents with  . Follow-up    abdominal wound    HPI Laura Reed is a 54 y.o. female. here today for a abdominal wound check. Patient states she is doing well and the area is not draining.                                                                             HPI  Past Medical History  Diagnosis Date  . Diabetes mellitus   . Hypertension   . Hemorrhoid   . PVD (peripheral vascular disease)   . Cancer   . Urothelial carcinoma of kidney 10/31/2014    INVASIVE UROTHELIAL CARCINOMA, LOW GRADE. T1, Nx.  Marland Kitchen Heart murmur   . CHF (congestive heart failure)   . Coronary artery disease     Past Surgical History  Procedure Laterality Date  . Coronary artery bypass graft  2009    3 vessel  . Arterial bypass surgry  2009, 2013 x 2    right leg , done in [place]  . Cesarean section    . Carotid endarterectomy  July 2015    Dr Delana Meyer  . Cholecystectomy  03-03-12    Porcelain gallbladder, gallstones,  [place]  . Colonoscopy w/ biopsies  04/28/2012    Hyperplastic rectal polyps.  . Nephrectomy Left 10-31-14  . Hernia repair  10-31-14    ventral, retro-rectus atrium mesh  . Amputation toe    . Cataract extraction w/phaco Right 12/14/2014    Procedure: CATARACT EXTRACTION PHACO AND INTRAOCULAR LENS PLACEMENT (IOC);  Surgeon: Lyla Glassing, MD;  Location: [facility] ORS;  Service: Ophthalmology;  Laterality: Right;  [place]   00:38.6              AP        7.1                   CDE  2.76    Family History  Problem Relation Age of Onset  . Cancer Mother 52    Lung Cancer  . Cancer Father 90    Lung Ca    Social History History  Substance Use Topics  . Smoking status: Former Smoker    Types: Cigarettes    Quit date: 03/30/2011  . Smokeless tobacco: Never Used  . Alcohol Use: No    No Known Allergies  Current Outpatient Prescriptions  Medication Sig Dispense Refill  .  ALPRAZolam (XANAX) 0.5 MG tablet Take 1 tablet (0.5 mg total) by mouth 3 (three) times daily as needed. (Patient taking differently: Take 0.5 mg by mouth 3 (three) times daily as needed for anxiety. ) 90 tablet 0  . aspirin 81 MG chewable tablet Chew 81 mg by mouth daily.    Marland Kitchen atorvastatin (LIPITOR) 20 MG tablet TAKE 1 TABLET BY MOUTH EVERY DAY 90 tablet 1  . B-D ULTRAFINE III SHORT PEN 31G X 8 MM MISC TEST BLOOD SUGAR TWICE DAILY 100 each 5  . Biotin 1000 MCG tablet Take 1,000 mcg by mouth daily.     . Cholecalciferol (VITAMIN D3) 2000 UNITS TABS Take 1  tablet by mouth daily.     . clopidogrel (PLAVIX) 75 MG tablet TAKE 1 TABLET BY MOUTH EVERY DAY 90 tablet 0  . DIGOX 125 MCG tablet TAKE 1 TABLET BY MOUTH EVERY DAY 90 tablet 1  . DUREZOL 0.05 % EMUL BEGINNING AFTER SURGERY INSTILL 1 DROP BID IN OPERATIVE EYE  0  . gabapentin (NEURONTIN) 600 MG tablet Take 1 tablet (600 mg total) by mouth 3 (three) times daily. 270 tablet 3  . ILEVRO 0.3 % ophthalmic suspension BEGINNING AFTER SURGERY INSTILL 1 DROP A DAY IN OPERATIVE EYE  0  . Insulin Lispro Prot & Lispro (HUMALOG 75/25 MIX) (75-25) 100 UNIT/ML Kwikpen 30 units in the am 36 units in the pm (Patient taking differently: Inject 30-36 Units into the skin 2 (two) times daily. Pt uses 30 units in the morning and 36 units in the evening.) 30 mL 11  . LEVEMIR FLEXTOUCH 100 UNIT/ML Pen INJECT 30 UNITS UNDER THE SKIN EVERY NIGHT AT BEDTIME 15 mL 5  . lisinopril (PRINIVIL,ZESTRIL) 5 MG tablet Take 1 tablet (5 mg total) by mouth daily. 90 tablet 3  . vitamin C (ASCORBIC ACID) 500 MG tablet Take 1,000 mg by mouth 2 (two) times daily.     No current facility-administered medications for this visit.    Review of Systems Review of Systems  Constitutional: Negative.   Respiratory: Negative.   Cardiovascular: Negative.     Blood pressure 124/68, pulse 74, resp. rate 12, height 5\' 6"  (1.676 m), weight 166 lb (75.297 kg).  Physical Exam Physical Exam   Constitutional: She is oriented to person, place, and time. She appears well-developed and well-nourished.  Abdominal:    Neurological: She is alert and oriented to person, place, and time.  Skin: Skin is dry.       Assessment    Doing well status post drainage of superficial wound infection.    Plan    The patient will report if she has no new issues, otherwise will plan on a follow-up in 3 months.    Patient to return in three months.   PCP:  Mattie Marlin 01/05/2015, 7:25 PM

## 2015-01-04 NOTE — Patient Instructions (Addendum)
Patient to return in three months.  

## 2015-01-16 DIAGNOSIS — M2040 Other hammer toe(s) (acquired), unspecified foot: Secondary | ICD-10-CM | POA: Diagnosis not present

## 2015-01-16 DIAGNOSIS — Q6689 Other  specified congenital deformities of feet: Secondary | ICD-10-CM | POA: Diagnosis not present

## 2015-01-24 ENCOUNTER — Encounter: Payer: Self-pay | Admitting: Internal Medicine

## 2015-01-24 ENCOUNTER — Ambulatory Visit (INDEPENDENT_AMBULATORY_CARE_PROVIDER_SITE_OTHER): Payer: Medicare Other | Admitting: Internal Medicine

## 2015-01-24 ENCOUNTER — Other Ambulatory Visit: Payer: Self-pay | Admitting: Internal Medicine

## 2015-01-24 VITALS — BP 140/60 | HR 78 | Temp 98.1°F | Resp 16 | Ht 66.0 in | Wt 167.0 lb

## 2015-01-24 DIAGNOSIS — E1142 Type 2 diabetes mellitus with diabetic polyneuropathy: Secondary | ICD-10-CM | POA: Diagnosis not present

## 2015-01-24 DIAGNOSIS — E1151 Type 2 diabetes mellitus with diabetic peripheral angiopathy without gangrene: Secondary | ICD-10-CM

## 2015-01-24 DIAGNOSIS — IMO0002 Reserved for concepts with insufficient information to code with codable children: Secondary | ICD-10-CM

## 2015-01-24 DIAGNOSIS — E1129 Type 2 diabetes mellitus with other diabetic kidney complication: Secondary | ICD-10-CM | POA: Diagnosis not present

## 2015-01-24 DIAGNOSIS — E1165 Type 2 diabetes mellitus with hyperglycemia: Secondary | ICD-10-CM

## 2015-01-24 DIAGNOSIS — G629 Polyneuropathy, unspecified: Secondary | ICD-10-CM

## 2015-01-24 DIAGNOSIS — E1159 Type 2 diabetes mellitus with other circulatory complications: Secondary | ICD-10-CM | POA: Diagnosis not present

## 2015-01-24 DIAGNOSIS — E1121 Type 2 diabetes mellitus with diabetic nephropathy: Secondary | ICD-10-CM

## 2015-01-24 LAB — COMPREHENSIVE METABOLIC PANEL
ALT: 15 U/L (ref 0–35)
AST: 16 U/L (ref 0–37)
Albumin: 3.9 g/dL (ref 3.5–5.2)
Alkaline Phosphatase: 84 U/L (ref 39–117)
BILIRUBIN TOTAL: 0.2 mg/dL (ref 0.2–1.2)
BUN: 30 mg/dL — AB (ref 6–23)
CO2: 25 meq/L (ref 19–32)
CREATININE: 1.08 mg/dL (ref 0.40–1.20)
Calcium: 9.3 mg/dL (ref 8.4–10.5)
Chloride: 100 mEq/L (ref 96–112)
GFR: 55.71 mL/min — ABNORMAL LOW (ref >60.00)
Glucose, Bld: 157 mg/dL — ABNORMAL HIGH (ref 70–99)
Potassium: 4.2 mEq/L (ref 3.5–5.1)
Sodium: 133 mEq/L — ABNORMAL LOW (ref 135–145)
Total Protein: 6.8 g/dL (ref 6.0–8.3)

## 2015-01-24 LAB — HEMOGLOBIN A1C: Hgb A1c MFr Bld: 7.5 % — ABNORMAL HIGH (ref 4.6–6.5)

## 2015-01-24 MED ORDER — INSULIN DETEMIR 100 UNIT/ML FLEXPEN: 40.0000 [IU] | PEN_INJECTOR | Freq: Every day | SUBCUTANEOUS | Status: DC

## 2015-01-24 MED ORDER — ALPRAZOLAM 0.5 MG PO TABS: 0.5000 mg | ORAL_TABLET | Freq: Three times a day (TID) | ORAL | Status: DC | PRN

## 2015-01-24 NOTE — Progress Notes (Signed)
Pre-visit discussion using our clinic review tool. No additional management support is needed unless otherwise documented below in the visit note.  

## 2015-01-24 NOTE — Progress Notes (Signed)
Subjective:  Patient ID: Laura Reed, female    DOB: 1961/04/25  Age: 54 y.o. MRN: [id number]  CC: The primary encounter diagnosis was Diabetic peripheral neuropathy associated with type 2 diabetes mellitus. Diagnoses of Uncontrolled type 2 diabetes mellitus with peripheral circulatory disorder and Diabetic nephropathy with proteinuria were also pertinent to this visit.  HPI Laura Reed presents for follow up on type 2 DM .  "I've had a  rough 3 months"  She underwent ventral hernia repair, and left  nephroureterectomy for management of  urothelial carcinoma  In late April surgery went well but post operative healing was complicated by fluid collection requiring new incision and tube placememtn for dranage. Laura Reed  She was prescribed antibitoics for positive culture.  Laura Reed in two months,  Laura Reed in August   Has had Some stress incontinence for years.   She also underwent Cataract surgery right eye  Complication, now needs laser surgery ,  And surgery on left eye planned along with bilatera l upper eyelid lift for ptosis, '  Sugars rarely over 200,  Mostly over 150    Outpatient Prescriptions Prior to Visit  Medication Sig Dispense Refill  . aspirin 81 MG chewable tablet Chew 81 mg by mouth daily.    Laura Reed atorvastatin (LIPITOR) 20 MG tablet TAKE 1 TABLET BY MOUTH EVERY DAY 90 tablet 1  . B-D ULTRAFINE III SHORT PEN 31G X 8 MM MISC TEST BLOOD SUGAR TWICE DAILY 100 each 5  . Biotin 1000 MCG tablet Take 1,000 mcg by mouth daily.     . Cholecalciferol (VITAMIN D3) 2000 UNITS TABS Take 1 tablet by mouth daily.     . clopidogrel (PLAVIX) 75 MG tablet TAKE 1 TABLET BY MOUTH EVERY DAY 90 tablet 0  . DIGOX 125 MCG tablet TAKE 1 TABLET BY MOUTH EVERY DAY 90 tablet 1  . gabapentin (NEURONTIN) 600 MG tablet Take 1 tablet (600 mg total) by mouth 3 (three) times daily. 270 tablet 3  . Insulin Lispro Prot & Lispro (HUMALOG 75/25 MIX) (75-25) 100 UNIT/ML Kwikpen 30 units in the am 36 units in the pm  (Patient taking differently: Inject 30-36 Units into the skin 2 (two) times daily. Pt uses 30 units in the morning and 36 units in the evening.) 30 mL 11  . lisinopril (PRINIVIL,ZESTRIL) 5 MG tablet Take 1 tablet (5 mg total) by mouth daily. 90 tablet 3  . vitamin C (ASCORBIC ACID) 500 MG tablet Take 1,000 mg by mouth 2 (two) times daily.    Laura Reed ALPRAZolam (XANAX) 0.5 MG tablet Take 1 tablet (0.5 mg total) by mouth 3 (three) times daily as needed. (Patient taking differently: Take 0.5 mg by mouth 3 (three) times daily as needed for anxiety. ) 90 tablet 0  . DUREZOL 0.05 % EMUL BEGINNING AFTER SURGERY INSTILL 1 DROP BID IN OPERATIVE EYE  0  . ILEVRO 0.3 % ophthalmic suspension BEGINNING AFTER SURGERY INSTILL 1 DROP A DAY IN OPERATIVE EYE  0  . LEVEMIR FLEXTOUCH 100 UNIT/ML Pen INJECT 30 UNITS UNDER THE SKIN EVERY NIGHT AT BEDTIME 15 mL 5   No facility-administered medications prior to visit.    Review of Systems;  Patient denies headache, fevers, malaise, unintentional weight loss, skin rash, eye pain, sinus congestion and sinus pain, sore throat, dysphagia,  hemoptysis , cough, dyspnea, wheezing, chest pain, palpitations, orthopnea, edema, abdominal pain, nausea, melena, diarrhea, constipation, flank pain, dysuria, hematuria, urinary  Frequency, nocturia, numbness, tingling, seizures,  Focal weakness, Loss  of consciousness,  Tremor, insomnia, depression, anxiety, and suicidal ideation.      Objective:  BP 140/60 mmHg  Pulse 78  Temp(Src) 98.1 F (36.7 C) (Oral)  Resp 16  Ht 5\' 6"  (1.676 m)  Wt 167 lb (75.751 kg)  BMI 26.97 kg/m2  SpO2 98%  BP Readings from Last 3 Encounters:  01/24/15 140/60  01/04/15 124/68  12/27/14 138/70    Wt Readings from Last 3 Encounters:  01/24/15 167 lb (75.751 kg)  01/04/15 166 lb (75.297 kg)  12/27/14 167 lb (75.751 kg)    General appearance: alert, cooperative and appears stated age Eyes: bilateral ptosis, pupils reactive.  Ears: normal TM's and  external ear canals both ears Throat: lips, mucosa, and tongue normal; teeth and gums normal Neck: no adenopathy, no carotid bruit, supple, symmetrical, trachea midline and thyroid not enlarged, symmetric, no tenderness/mass/nodules Back: symmetric, no curvature. ROM normal. No CVA tenderness. Lungs: clear to auscultation bilaterally Heart: regular rate and rhythm, S1, S2 normal, no murmur, click, rub or gallop Abdomen: soft, non-tender; bowel sounds normal, surgical scars healing well,  No fluid collections  no masses,  no organomegaly Pulses: 2+ and symmetric in dorsalis pedis, feet arm,  Several amputations bilaterally  Skin: Skin color, texture, turgor normal. No rashes or lesions Lymph nodes: Cervical, supraclavicular, and axillary nodes normal.  Lab Results  Component Value Date   HGBA1C 7.5* 01/24/2015   HGBA1C 7.2* 10/04/2014   HGBA1C 6.4 06/16/2014    Lab Results  Component Value Date   CREATININE 1.08 01/24/2015   CREATININE 1.21* 11/02/2014   CREATININE 0.87 11/01/2014    Lab Results  Component Value Date   WBC 12.8* 11/02/2014   HGB 9.5* 11/02/2014   HCT 29.5* 11/02/2014   PLT 217 11/02/2014   GLUCOSE 157* 01/24/2015   CHOL 120 06/16/2014   TRIG 132.0 06/16/2014   HDL 36.00* 06/16/2014   LDLDIRECT 71.0 10/04/2014   LDLCALC 58 06/16/2014   ALT 15 01/24/2015   AST 16 01/24/2015   NA 133* 01/24/2015   K 4.2 01/24/2015   CL 100 01/24/2015   CREATININE 1.08 01/24/2015   BUN 30* 01/24/2015   CO2 25 01/24/2015   TSH 1.03 03/17/2014   INR 0.9 10/17/2014   HGBA1C 7.5* 01/24/2015   MICROALBUR 4.3* 06/16/2014    No results found.  Assessment & Plan:   Problem List Items Addressed This Visit      Unprioritized   Uncontrolled type 2 diabetes mellitus with peripheral circulatory disorder    Historically well-controlled on current medications. With some loss of control due ot recent surgeries   . Patient is up-to-date on eye exams and foot exam is unchanged  today. Patient  Has had  urine microalbumin to creatinine ratio done within the past year. Patient is tolerating statin therapy for CAD risk reduction and on ACE/ARB for reduction in proteinuria.   Lab Results  Component Value Date   HGBA1C 7.5* 01/24/2015   Lab Results  Component Value Date   MICROALBUR 4.3* 06/16/2014         Relevant Medications   Insulin Detemir (LEVEMIR FLEXTOUCH) 100 UNIT/ML Pen   Other Relevant Orders   Comprehensive metabolic panel (Completed)   Hemoglobin A1c (Completed)   Diabetic nephropathy with proteinuria     Patient is tolerating ACE/ARB for reduction in proteinuria.   Lab Results  Component Value Date   MICROALBUR 4.3* 06/16/2014   Lab Results  Component Value Date   CREATININE 1.08 01/24/2015  Relevant Medications   Insulin Detemir (LEVEMIR FLEXTOUCH) 100 UNIT/ML Pen   Diabetic peripheral neuropathy associated with type 2 diabetes mellitus - Primary   Relevant Medications   Insulin Detemir (LEVEMIR FLEXTOUCH) 100 UNIT/ML Pen   ALPRAZolam (XANAX) 0.5 MG tablet     A total of 25 minutes of face to face time was spent with patient more than half of which was spent in counselling about the above mentioned conditions  and coordination of care  I have discontinued Ms. Sigler's ILEVRO and DUREZOL. I have also changed her LEVEMIR FLEXTOUCH to Insulin Detemir. Additionally, I am having her maintain her Vitamin D3, vitamin C, Biotin, gabapentin, atorvastatin, lisinopril, Insulin Lispro Prot & Lispro, DIGOX, B-D ULTRAFINE III SHORT PEN, clopidogrel, aspirin, and ALPRAZolam.  Meds ordered this encounter  Medications  . Insulin Detemir (LEVEMIR FLEXTOUCH) 100 UNIT/ML Pen    Sig: Inject 40 Units into the skin daily at 10 pm.    Dispense:  15 mL    Refill:  5  . ALPRAZolam (XANAX) 0.5 MG tablet    Sig: Take 1 tablet (0.5 mg total) by mouth 3 (three) times daily as needed.    Dispense:  90 tablet    Refill:  1    Medications Discontinued  During This Encounter  Medication Reason  . DUREZOL 0.05 % EMUL Completed Course  . ILEVRO 0.3 % ophthalmic suspension Completed Course  . LEVEMIR FLEXTOUCH 100 UNIT/ML Pen Reorder  . ALPRAZolam (XANAX) 0.5 MG tablet Reorder    Follow-up: No Follow-up on file.   [name], MD

## 2015-01-25 ENCOUNTER — Other Ambulatory Visit: Payer: Self-pay | Admitting: Vascular Surgery

## 2015-01-25 DIAGNOSIS — I70213 Atherosclerosis of native arteries of extremities with intermittent claudication, bilateral legs: Secondary | ICD-10-CM | POA: Diagnosis not present

## 2015-01-25 DIAGNOSIS — E785 Hyperlipidemia, unspecified: Secondary | ICD-10-CM | POA: Diagnosis not present

## 2015-01-25 DIAGNOSIS — I6529 Occlusion and stenosis of unspecified carotid artery: Secondary | ICD-10-CM | POA: Diagnosis not present

## 2015-01-25 DIAGNOSIS — I70219 Atherosclerosis of native arteries of extremities with intermittent claudication, unspecified extremity: Secondary | ICD-10-CM

## 2015-01-25 DIAGNOSIS — I2510 Atherosclerotic heart disease of native coronary artery without angina pectoris: Secondary | ICD-10-CM | POA: Diagnosis not present

## 2015-01-25 DIAGNOSIS — I10 Essential (primary) hypertension: Secondary | ICD-10-CM | POA: Diagnosis not present

## 2015-01-26 ENCOUNTER — Encounter: Payer: Self-pay | Admitting: Internal Medicine

## 2015-01-27 DIAGNOSIS — E1121 Type 2 diabetes mellitus with diabetic nephropathy: Secondary | ICD-10-CM | POA: Insufficient documentation

## 2015-01-27 NOTE — Assessment & Plan Note (Signed)
Patient is tolerating ACE/ARB for reduction in proteinuria.   Lab Results  Component Value Date   MICROALBUR 4.3* 06/16/2014   Lab Results  Component Value Date   CREATININE 1.08 01/24/2015

## 2015-01-27 NOTE — Assessment & Plan Note (Signed)
Historically well-controlled on current medications. With some loss of control due ot recent surgeries   . Patient is up-to-date on eye exams and foot exam is unchanged today. Patient  Has had  urine microalbumin to creatinine ratio done within the past year. Patient is tolerating statin therapy for CAD risk reduction and on ACE/ARB for reduction in proteinuria.   Lab Results  Component Value Date   HGBA1C 7.5* 01/24/2015   Lab Results  Component Value Date   MICROALBUR 4.3* 06/16/2014

## 2015-02-05 DIAGNOSIS — H26491 Other secondary cataract, right eye: Secondary | ICD-10-CM | POA: Diagnosis not present

## 2015-02-07 ENCOUNTER — Ambulatory Visit
Admission: RE | Admit: 2015-02-07 | Discharge: 2015-02-07 | Disposition: A | Discharge status: Home / Self Care | Payer: Medicare Other | Source: Ambulatory Visit | Attending: Vascular Surgery | Admitting: Vascular Surgery

## 2015-02-07 DIAGNOSIS — I70213 Atherosclerosis of native arteries of extremities with intermittent claudication, bilateral legs: Secondary | ICD-10-CM | POA: Diagnosis not present

## 2015-02-07 DIAGNOSIS — I70219 Atherosclerosis of native arteries of extremities with intermittent claudication, unspecified extremity: Secondary | ICD-10-CM

## 2015-02-07 DIAGNOSIS — I739 Peripheral vascular disease, unspecified: Secondary | ICD-10-CM | POA: Diagnosis not present

## 2015-02-07 DIAGNOSIS — I745 Embolism and thrombosis of iliac artery: Secondary | ICD-10-CM | POA: Diagnosis not present

## 2015-02-07 MED ORDER — IOHEXOL 350 MG/ML SOLN
125.0000 mL | Freq: Once | INTRAVENOUS | Status: AC | PRN
  Administered 2015-02-07: 125 mL via INTRAVENOUS

## 2015-02-09 ENCOUNTER — Ambulatory Visit (INDEPENDENT_AMBULATORY_CARE_PROVIDER_SITE_OTHER): Payer: Medicare Other | Admitting: Urology

## 2015-02-09 VITALS — BP 127/70 | HR 69 | Ht 66.0 in | Wt 167.5 lb

## 2015-02-09 DIAGNOSIS — C642 Malignant neoplasm of left kidney, except renal pelvis: Secondary | ICD-10-CM | POA: Diagnosis not present

## 2015-02-09 DIAGNOSIS — R319 Hematuria, unspecified: Secondary | ICD-10-CM | POA: Diagnosis not present

## 2015-02-09 LAB — MICROSCOPIC EXAMINATION: RENAL EPITHEL UA: NONE SEEN /HPF

## 2015-02-09 LAB — URINALYSIS, COMPLETE
BILIRUBIN UA: NEGATIVE
Glucose, UA: NEGATIVE
Ketones, UA: NEGATIVE
Leukocytes, UA: NEGATIVE
NITRITE UA: NEGATIVE
Protein, UA: NEGATIVE
Specific Gravity, UA: 1.015 (ref 1.005–1.030)
UUROB: 0.2 mg/dL (ref 0.2–1.0)
pH, UA: 5.5 (ref 5.0–7.5)

## 2015-02-09 MED ORDER — LIDOCAINE HCL 2 % EX GEL
1.0000 "application " | Freq: Once | CUTANEOUS | Status: AC
  Administered 2015-02-09: 1 via URETHRAL

## 2015-02-09 MED ORDER — CIPROFLOXACIN HCL 500 MG PO TABS
500.0000 mg | ORAL_TABLET | Freq: Once | ORAL | Status: AC
  Administered 2015-02-09: 500 mg via ORAL

## 2015-02-09 NOTE — Progress Notes (Signed)
02/09/2015 10:15 AM   Laura Reed 06/22/1961 [id number]  Referring provider: [name], MD [place], [place] [address]  Chief Complaint  Patient presents with  . Procedure    Cysto, pt had Left robotic nephroureterectomy 10/31/14     HPI:  54 year old female status post LEFT robotic nephroureterectomy on 10/31/2014 as well as combined ventral hernia repair with Dr. Bary Castilla. Post complicated by wound infection now well healed.  Pathology was reviewed today with the patient. pT1NxN0. Tumor was noted to be low grade invasive into lamina propria. Surgical margins are negative.  She does have an extensive smoking history, quit 3 years ago but smoked up to 2 packs a day for 35 years. She also has multiple medical comorbidities including history of diabetes, CAD status post CABG, carotid endarterectomy.    She presents today for routine cystoscopy.     PMH: Past Medical History  Diagnosis Date  . Diabetes mellitus   . Hypertension   . Hemorrhoid   . PVD (peripheral vascular disease)   . Heart murmur   . CHF (congestive heart failure)   . Coronary artery disease   . Cancer   . Urothelial carcinoma of kidney 10/31/2014    INVASIVE UROTHELIAL CARCINOMA, LOW GRADE. T1, Nx.    Surgical History: Past Surgical History  Procedure Laterality Date  . Coronary artery bypass graft  2009    3 vessel  . Arterial bypass surgry  2009, 2013 x 2    right leg , done in [place]  . Cesarean section    . Carotid endarterectomy  July 2015    Dr Delana Meyer  . Cholecystectomy  03-03-12    Porcelain gallbladder, gallstones,  [place]  . Colonoscopy w/ biopsies  04/28/2012    Hyperplastic rectal polyps.  . Nephrectomy Left 10-31-14  . Hernia repair  10-31-14    ventral, retro-rectus atrium mesh  . Amputation toe    . Cataract extraction w/phaco Right 12/14/2014    Procedure: CATARACT EXTRACTION PHACO AND INTRAOCULAR LENS PLACEMENT (IOC);  Surgeon: Lyla Glassing, MD;  Location: [facility] ORS;  Service: Ophthalmology;  Laterality: Right;  [place]   00:38.6              AP        7.1                   CDE  2.76    Home Medications:    Medication List       This list is accurate as of: 02/09/15 10:15 AM.  Always use your most recent med list.               ACCU-CHEK AVIVA PLUS test strip  Generic drug:  glucose blood  CHECK  BLOOD  SUGAR    TWO TO  THREE  TIMES  A  DAY     ALPRAZolam 0.5 MG tablet  Commonly known as:  XANAX  Take 1 tablet (0.5 mg total) by mouth 3 (three) times daily as needed.     aspirin 81 MG chewable tablet  Chew 81 mg by mouth daily.     atorvastatin 20 MG tablet  Commonly known as:  LIPITOR  TAKE 1 TABLET BY MOUTH EVERY DAY     B-D ULTRAFINE III SHORT PEN 31G X 8 MM Misc  Generic drug:  Insulin Pen Needle  TEST BLOOD SUGAR TWICE DAILY     Biotin 1000 MCG tablet  Take 1,000 mcg by  mouth daily.     clopidogrel 75 MG tablet  Commonly known as:  PLAVIX  TAKE 1 TABLET BY MOUTH EVERY DAY     DIGOX 0.125 MG tablet  Generic drug:  digoxin  TAKE 1 TABLET BY MOUTH EVERY DAY     gabapentin 600 MG tablet  Commonly known as:  NEURONTIN  Take 1 tablet (600 mg total) by mouth 3 (three) times daily.     Insulin Detemir 100 UNIT/ML Pen  Commonly known as:  LEVEMIR FLEXTOUCH  Inject 40 Units into the skin daily at 10 pm.     Insulin Lispro Prot & Lispro (75-25) 100 UNIT/ML Kwikpen  Commonly known as:  HUMALOG 75/25 MIX  30 units in the am 36 units in the pm     lisinopril 5 MG tablet  Commonly known as:  PRINIVIL,ZESTRIL  Take 1 tablet (5 mg total) by mouth daily.     vitamin C 500 MG tablet  Commonly known as:  ASCORBIC ACID  Take 1,000 mg by mouth 2 (two) times daily.     Vitamin D3 2000 UNITS Tabs  Take 1 tablet by mouth daily.        Allergies: No Known Allergies  Family History: Family History  Problem Relation Age of Onset  . Cancer Mother 23    Lung Cancer  . Cancer Father 86    Lung Ca     Social History:  reports that she quit smoking about 3 years ago. Her smoking use included Cigarettes. She has never used smokeless tobacco. She reports that she does not drink alcohol or use illicit drugs.  Physical Exam: BP 127/70 mmHg  Pulse 69  Ht 5\' 6"  (1.676 m)  Wt 167 lb 8 oz (75.978 kg)  BMI 27.05 kg/m2  Constitutional:  Alert and oriented, No acute distress. HEENT: [place] AT, moist mucus membranes.  Trachea midline, no masses. Cardiovascular: No clubbing, cyanosis, or edema. Respiratory: Normal respiratory effort, no increased work of breathing. GI: Abdomen is soft, nontender, nondistended, no abdominal masses.  Wounds well healed, no abdominal hernia.   GU: No CVA tenderness. Normal external genitalia.   Skin: No rashes, bruises or suspicious lesions. Neurologic: Grossly intact, no focal deficits, moving all 4 extremities. Psychiatric: Normal mood and affect.  Laboratory Data: Lab Results  Component Value Date   WBC 12.8* 11/02/2014   HGB 9.5* 11/02/2014   HCT 29.5* 11/02/2014   MCV 87 11/02/2014   PLT 217 11/02/2014    Lab Results  Component Value Date   CREATININE 1.08 01/24/2015    Lab Results  Component Value Date   HGBA1C 7.5* 01/24/2015    Urinalysis UA with 3-10 RBC otherwise negative.   Cystoscopy Procedure Note  Patient identification was confirmed, informed consent was obtained, and patient was prepped using Betadine solution.  Lidocaine jelly was administered per urethral meatus.    Preoperative abx where received prior to procedure.    Procedure: - Flexible cystoscope introduced, without any difficulty.   - Thorough search of the bladder revealed:    normal urethral meatus    normal urothelium    no stones    no ulcers     no tumors    no urethral polyps    no trabeculation  - Right UO normal in position and appearance, Left UO not clearly visualized.  Post-Procedure: - Patient tolerated the procedure well  Assessment & Plan:     1. Urothelial carcinoma of kidney, left LEFT robotic nephroureterectomy on 10/31/2014, pT1NxN0.  Cysto today negative.  Recommend continued q3 month cysto x 1 year per NCCN surveillance guidelines.   - Urinalysis, Complete - ciprofloxacin (CIPRO) tablet 500 mg; Take 1 tablet (500 mg total) by mouth once. - lidocaine (XYLOCAINE) 2 % jelly 1 application; Place 1 application into the urethra once.   Return in about 3 months (around 05/12/2015) for cysto.  Hollice Espy, MD  [facility] [address] [place], [place] [address] [phone]

## 2015-02-12 DIAGNOSIS — I70213 Atherosclerosis of native arteries of extremities with intermittent claudication, bilateral legs: Secondary | ICD-10-CM | POA: Diagnosis not present

## 2015-02-12 DIAGNOSIS — E785 Hyperlipidemia, unspecified: Secondary | ICD-10-CM | POA: Diagnosis not present

## 2015-02-12 DIAGNOSIS — I10 Essential (primary) hypertension: Secondary | ICD-10-CM | POA: Diagnosis not present

## 2015-02-15 ENCOUNTER — Telehealth: Payer: Self-pay | Admitting: Internal Medicine

## 2015-02-15 NOTE — Telephone Encounter (Signed)
Pt dropped off letter. Letter in Dr. Lupita Dawn box/msn

## 2015-02-15 NOTE — Telephone Encounter (Signed)
In red folder. 

## 2015-02-20 ENCOUNTER — Ambulatory Visit
Admission: RE | Admit: 2015-02-20 | Discharge: 2015-02-20 | Disposition: A | Discharge status: Home / Self Care | Payer: Medicare Other | Source: Ambulatory Visit | Attending: Vascular Surgery | Admitting: Vascular Surgery

## 2015-02-20 ENCOUNTER — Encounter
Admission: RE | Disposition: A | Discharge status: Home / Self Care | Payer: Self-pay | Source: Ambulatory Visit | Attending: Vascular Surgery

## 2015-02-20 ENCOUNTER — Ambulatory Visit: Payer: Medicare Other | Admitting: Anesthesiology

## 2015-02-20 ENCOUNTER — Encounter: Payer: Self-pay | Admitting: *Deleted

## 2015-02-20 DIAGNOSIS — E279 Disorder of adrenal gland, unspecified: Secondary | ICD-10-CM | POA: Diagnosis not present

## 2015-02-20 DIAGNOSIS — M1990 Unspecified osteoarthritis, unspecified site: Secondary | ICD-10-CM | POA: Diagnosis not present

## 2015-02-20 DIAGNOSIS — I10 Essential (primary) hypertension: Secondary | ICD-10-CM | POA: Diagnosis not present

## 2015-02-20 DIAGNOSIS — I2510 Atherosclerotic heart disease of native coronary artery without angina pectoris: Secondary | ICD-10-CM | POA: Insufficient documentation

## 2015-02-20 DIAGNOSIS — E669 Obesity, unspecified: Secondary | ICD-10-CM | POA: Diagnosis not present

## 2015-02-20 DIAGNOSIS — Z794 Long term (current) use of insulin: Secondary | ICD-10-CM | POA: Diagnosis not present

## 2015-02-20 DIAGNOSIS — I6529 Occlusion and stenosis of unspecified carotid artery: Secondary | ICD-10-CM | POA: Insufficient documentation

## 2015-02-20 DIAGNOSIS — I70213 Atherosclerosis of native arteries of extremities with intermittent claudication, bilateral legs: Secondary | ICD-10-CM | POA: Diagnosis not present

## 2015-02-20 DIAGNOSIS — E119 Type 2 diabetes mellitus without complications: Secondary | ICD-10-CM | POA: Diagnosis not present

## 2015-02-20 DIAGNOSIS — K8020 Calculus of gallbladder without cholecystitis without obstruction: Secondary | ICD-10-CM | POA: Insufficient documentation

## 2015-02-20 DIAGNOSIS — Z79899 Other long term (current) drug therapy: Secondary | ICD-10-CM | POA: Insufficient documentation

## 2015-02-20 DIAGNOSIS — I739 Peripheral vascular disease, unspecified: Secondary | ICD-10-CM | POA: Diagnosis not present

## 2015-02-20 DIAGNOSIS — I70212 Atherosclerosis of native arteries of extremities with intermittent claudication, left leg: Secondary | ICD-10-CM | POA: Diagnosis not present

## 2015-02-20 HISTORY — PX: PERIPHERAL VASCULAR CATHETERIZATION: SHX172C

## 2015-02-20 LAB — BASIC METABOLIC PANEL
Anion gap: 8 (ref 5–15)
BUN: 42 mg/dL — AB (ref 6–20)
CO2: 27 mmol/L (ref 22–32)
CREATININE: 1.26 mg/dL — AB (ref 0.44–1.00)
Calcium: 9.6 mg/dL (ref 8.9–10.3)
Chloride: 106 mmol/L (ref 101–111)
GFR calc Af Amer: 54 mL/min — ABNORMAL LOW (ref >60)
GFR, EST NON AFRICAN AMERICAN: 47 mL/min — AB (ref >60)
Glucose, Bld: 79 mg/dL (ref 65–99)
Potassium: 4.7 mmol/L (ref 3.5–5.1)
Sodium: 141 mmol/L (ref 135–145)

## 2015-02-20 SURGERY — PELVIC ANGIOGRAPHY
Anesthesia: General | Laterality: Left

## 2015-02-20 MED ORDER — PROPOFOL 10 MG/ML IV BOLUS
INTRAVENOUS | Status: DC | PRN
  Administered 2015-02-20: 15 mg via INTRAVENOUS

## 2015-02-20 MED ORDER — SODIUM CHLORIDE 0.9 % IV SOLN
INTRAVENOUS | Status: DC
  Administered 2015-02-20: 07:00:00 via INTRAVENOUS

## 2015-02-20 MED ORDER — IOHEXOL 300 MG/ML  SOLN
INTRAMUSCULAR | Status: DC | PRN
  Administered 2015-02-20: 95 mL via INTRA_ARTERIAL

## 2015-02-20 MED ORDER — ONDANSETRON HCL 4 MG/2ML IJ SOLN: 4.0000 mg | Freq: Once | INTRAMUSCULAR | Status: DC | PRN

## 2015-02-20 MED ORDER — CEFAZOLIN SODIUM 1-5 GM-% IV SOLN: 1.0000 g | Freq: Once | INTRAVENOUS | Status: DC

## 2015-02-20 MED ORDER — CEFAZOLIN SODIUM 1-5 GM-% IV SOLN
INTRAVENOUS | Status: DC | PRN
  Administered 2015-02-20: 1 g via INTRAVENOUS

## 2015-02-20 MED ORDER — SODIUM BICARBONATE BOLUS VIA INFUSION
INTRAVENOUS | Status: AC
  Administered 2015-02-20: 08:00:00 via INTRAVENOUS
  Filled 2015-02-20: qty 1

## 2015-02-20 MED ORDER — OXYCODONE HCL 5 MG PO TABS: 5.0000 mg | ORAL_TABLET | ORAL | Status: DC | PRN

## 2015-02-20 MED ORDER — DEXTROSE 50 % IV SOLN
INTRAVENOUS | Status: AC
  Administered 2015-02-20: 25 mL via INTRAVENOUS
  Filled 2015-02-20: qty 50

## 2015-02-20 MED ORDER — HEPARIN SODIUM (PORCINE) 1000 UNIT/ML IJ SOLN
INTRAMUSCULAR | Status: DC | PRN
  Administered 2015-02-20: 3000 [IU] via INTRAVENOUS

## 2015-02-20 MED ORDER — HEPARIN (PORCINE) IN NACL 2-0.9 UNIT/ML-% IJ SOLN
INTRAMUSCULAR | Status: AC
  Filled 2015-02-20: qty 1000

## 2015-02-20 MED ORDER — HYDROMORPHONE HCL 1 MG/ML IJ SOLN: 0.5000 mg | INTRAMUSCULAR | Status: DC | PRN

## 2015-02-20 MED ORDER — ACETAMINOPHEN 325 MG PO TABS: 325.0000 mg | ORAL_TABLET | ORAL | Status: DC | PRN

## 2015-02-20 MED ORDER — ACETAMINOPHEN 325 MG RE SUPP: 325.0000 mg | RECTAL | Status: DC | PRN

## 2015-02-20 MED ORDER — PROPOFOL INFUSION 10 MG/ML OPTIME
INTRAVENOUS | Status: DC | PRN
  Administered 2015-02-20: 50 ug/kg/min via INTRAVENOUS

## 2015-02-20 MED ORDER — MIDAZOLAM HCL 2 MG/2ML IJ SOLN
INTRAMUSCULAR | Status: DC | PRN
  Administered 2015-02-20: 2 mg via INTRAVENOUS

## 2015-02-20 MED ORDER — SODIUM CHLORIDE 0.9 % IJ SOLN
3.0000 mL | Freq: Three times a day (TID) | INTRAMUSCULAR | Status: DC
  Administered 2015-02-20: 10 mL via INTRAVENOUS

## 2015-02-20 MED ORDER — DEXTROSE 50 % IV SOLN
25.0000 mL | Freq: Once | INTRAVENOUS | Status: AC
  Administered 2015-02-20: 25 mL via INTRAVENOUS

## 2015-02-20 MED ORDER — SODIUM BICARBONATE 8.4 % IV SOLN
INTRAVENOUS | Status: AC
  Administered 2015-02-20: 225 mL via INTRAVENOUS
  Administered 2015-02-20: 100 mL via INTRAVENOUS
  Filled 2015-02-20: qty 500

## 2015-02-20 MED ORDER — FENTANYL CITRATE (PF) 100 MCG/2ML IJ SOLN: 25.0000 ug | INTRAMUSCULAR | Status: DC | PRN

## 2015-02-20 MED ORDER — SODIUM CHLORIDE 0.9 % IV SOLN
250.0000 mg | INTRAVENOUS | Status: DC | PRN
  Administered 2015-02-20: 5 ug/kg/min via INTRAVENOUS

## 2015-02-20 MED ORDER — FENTANYL CITRATE (PF) 100 MCG/2ML IJ SOLN
INTRAMUSCULAR | Status: DC | PRN
  Administered 2015-02-20: 25 ug via INTRAVENOUS

## 2015-02-20 MED ORDER — LIDOCAINE HCL (PF) 1 % IJ SOLN
INTRAMUSCULAR | Status: AC
  Filled 2015-02-20: qty 10

## 2015-02-20 SURGICAL SUPPLY — 17 items
BALLN LUTONIX DCB 5X80X130 (BALLOONS) ×3
BALLN LUTONIX DCB 6X40X130 (BALLOONS) ×3
BALLOON LUTONIX DCB 5X80X130 (BALLOONS) ×1 IMPLANT
BALLOON LUTONIX DCB 6X40X130 (BALLOONS) ×1 IMPLANT
CATH BEACON 5.038 65CM KMP-01 (CATHETERS) ×3 IMPLANT
CATH ROYAL FLUSH PIG 5F 70CM (CATHETERS) ×3 IMPLANT
DEVICE CLOSURE MYNXGRIP 6/7F (Vascular Products) ×3 IMPLANT
DEVICE INFLATION 20/30 (MISCELLANEOUS) ×3 IMPLANT
GLIDEWIRE ADV .035X260CM (WIRE) ×3 IMPLANT
PACK ANGIOGRAPHY (CUSTOM PROCEDURE TRAY) ×3 IMPLANT
SET INTRO CAPELLA COAXIAL (SET/KITS/TRAYS/PACK) ×3 IMPLANT
SHEATH BRITE TIP 5FRX11 (SHEATH) ×3 IMPLANT
SHEATH BRITE TIP 6FRX11 (SHEATH) ×3 IMPLANT
STENT LIFESTAR 7X80X80 (Permanent Stent) ×3 IMPLANT
SYR MEDRAD MARK V 150ML (SYRINGE) ×3 IMPLANT
TUBING CONTRAST HIGH PRESS 72 (TUBING) ×3 IMPLANT
WIRE J 3MM .035X145CM (WIRE) ×3 IMPLANT

## 2015-02-20 NOTE — OR Nursing (Signed)
LFA dressing with 1/2 saturated with blood. Dressing removed and site without hematoma, slight slow ooze noted, directed pressure held. Area redressed. Plan: continue to monitor closing and if remains stable will D/s home.

## 2015-02-20 NOTE — Op Note (Signed)
[facility]  Percutaneous Study/Intervention Procedural Note   Date of Surgery: 02/20/2015  Surgeon:[name], Dolores Lory   Pre-operative Diagnosis: Atherosclerotic occlusive disease bilateral lower extremities with lifestyle limiting claudication; complication of vascular device; coronary artery disease; diabetes mellitus  Post-operative diagnosis:  Same  Procedure(s) Performed:  1.  Abdominal aortogram  2.  Left lower extremity distal runoff first order catheter placement  3.  Percutaneous transluminal angioplasty of the left common iliac artery to 6 mm with Lutonix balloon  4.  Percutaneous transluminal and plasty and stent placement of the left external iliac artery with predilatation using a Lutonix balloon   Anesthesia: Mac with anesthesia present  Sheath: 6 French retrograde positioned in the femoropopliteal bypass itself left side  Contrast: 95 cc  Fluoroscopy Time: 5.8 minutes  Indications:  The patient presented to the office for her 6 month follow-up. She noted significant worsening of her leg pain and deterioration of her claudication distance. Noninvasive studies demonstrated a significant drop bilaterally. CT angiography was consistent with left common and external iliac artery stenosis. Also of note is outflow stenosis on the left within the popliteal area and given her bilateral femoropopliteal's as well as her femoral to femoral bypass graft oral based on the left external and common iliac artery as the inflow in association with the deterioration of her claudication distance and her worsening noninvasive studies I recommended she undergo angiography with intervention for preservation of her reconstruction. The risks and benefits of been reviewed all questions of been answered patient has agreed to proceed  Procedure:  Laura Reed a 54 y.o. female who was identified and appropriate procedural time out was performed.  The patient was then placed  supine on the table and prepped and draped in the usual sterile fashion.  Ultrasound was used to evaluate the left femoral to popliteal bypass graft.  It was patent.  A digital ultrasound image was acquired.  A micropuncture needle was used to access the left femoral to popliteal bypass graft approximately 10 cm distal to the proximal anastomosis under direct ultrasound guidance and a permanent image was performed.  A microwire was then advanced under fluoroscopy followed by a micro-sheath. A 0.035 J wire was advanced without resistance and a 5Fr sheath was placed.    An advantage wire was then note negotiated across the femoral bifurcation as well as the femorofemoral origin initially both the microwire as well as the advantage wire self selected the femoral to femoral bypass. Hand injection of contrast within oblique view demonstrated the proper pathway and the wire and subsequent a Kumpe catheter were negotiated into the external iliac and then the common iliac followed by positioning of the catheter within the aorta. Kumpe catheter was exchanged for a pigtail catheter and AP projection of the aorta was obtained.  Bilateral obliques of the pelvis were then obtained. After review these images 3000 units of heparin was given the advantage wire was reintroduced and a 6 [place] sheath was exchanged for the 5 [place] sheath. Magnified oblique views in the LAO projection of the left iliac arteries was obtained and high-grade lesion was noted in the distal common and this was initially dilated using a 6 x 4 Lutonix to 10 atm for 3 minutes. Subsequently the external iliac artery was treated with a 5 x 8 Lutonix inflated to 12 atm for 3 full minutes. Follow-up imaging via the Kumpe catheter positioned in the distal aorta demonstrated the common iliac was well treated without evidence of  dissection however the external iliac was suboptimally treated and therefore an 7 x 80 life*stent was deployed beginning at the origin  of the external and extending it down to the distal external iliac on the left. This stent was then postdilated with a 6 x 4 balloon serially inflations were to 6-8 atm for 10-15 seconds.  The catheter was then reintroduced over the wire into the distal aorta and oblique view of the iliac system was obtained demonstrating excellent result within the external no evidence of flow limiting dissection with full expansion of the stent.  Distal runoff was then obtained by hand injection through the sheath in the femoral popliteal bypass graft after review these images it is elected to terminate the case and a minx device is deployed under magnified imaging without difficulty. There are no immediate complications.  Findings:   Aortogram:  The abdominal aorta is opacified with a bolus injection contrast. There is diffuse disease but no hemodynamically significant lesions are noted.  Right Lower Extremity:  The right common iliac artery stents patent but there is a string sign located extending from the distal margin and there is occlusion of the external iliac on the right.  Left Lower Extremity:  The left common iliac demonstrates a 60-70% stenosis in its distal portion proximally there are calcific areas of plaque noted but they do not appear to be greater than 50% mid do not appear to be hemodynamically significant. The internal iliac is patent. The external iliac is patent but demonstrates diffuse disease throughout its mid and distal portions the 5 French catheter is almost occlusive. Distally the femoral endarterectomy remains widely patent the femoral to femoral bypass graft is widely patent the profunda femoris is heavily diseased on the left the femoral to above-knee popliteal bypass graft is widely patent.  Distally there is approximately 70% diameter reduction this is located approximate 1 cm beyond the above-knee anastomosis. Then throughout the in midportion of the popliteal there is diffuse  narrowing. Multiple geniculate are identified. Distally the popliteal returns to more normal calorie and is free of hemodynamically significant stenosis. There appears to be 2 vessel runoff to the foot via anterior tibial and posterior tibial. Peroneal is quite small and does not fill distally  Following angioplasty of the common iliac artery distally with a 6 mm tonics there is excellent result stenting is not required. Following and plasty to 5 mm with a Lutonix balloon the external iliac artery demonstrates hemodynamically significant residual stenosis and therefore a 6 x 80 life*stent is placed and postdilated to 6 mm with excellent result. There is significant improvement in flow noted with injection of the distal aorta.  Summary successful and plasty and recanalization of the iliac system on the left providing normalization for inflow to the entire bilateral reconstruction. I believe this will prevent thrombosis of her femorofemoral and femoral popliteal bypass grafts. Ultimately I do believe the popliteal does need to be treated on the left however given the difficulty of accessing the femoral popliteal bypass graft directly and the location which was selected for the retrograde puncture I'm afraid that trying an antegrade access at this time as the potential to create complication particularly with the minx device that was deployed. I therefore we will wait approximately 30 days and return her for treatment of the left distal disease.  Disposition: Patient was taken to the recovery room in stable condition having tolerated the procedure well.  [name], Dolores Lory 02/20/2015,10:23 AM

## 2015-02-20 NOTE — Anesthesia Preprocedure Evaluation (Signed)
Anesthesia Evaluation  Patient identified by MRN, date of birth, ID band Patient awake    Reviewed: Allergy & Precautions, NPO status , Patient's Chart, lab work & pertinent test results  History of Anesthesia Complications Negative for: history of anesthetic complications  Airway Mallampati: II  TM Distance: >3 FB Neck ROM: Full    Dental  (+) Upper Dentures, Lower Dentures   Pulmonary former smoker (quit x 4 yrs),          Cardiovascular hypertension, Pt. on medications + CAD and + Peripheral Vascular Disease + Valvular Problems/Murmurs (pt c/o murmur)     Neuro/Psych    GI/Hepatic   Endo/Other  diabetes, Type 2, Insulin Dependent  Renal/GU Renal disease (l nephrectomy )     Musculoskeletal   Abdominal   Peds  Hematology   Anesthesia Other Findings   Reproductive/Obstetrics                             Anesthesia Physical Anesthesia Plan  ASA: III  Anesthesia Plan: General   Post-op Pain Management:    Induction: Intravenous  Airway Management Planned:   Additional Equipment:   Intra-op Plan:   Post-operative Plan:   Informed Consent: I have reviewed the patients History and Physical, chart, labs and discussed the procedure including the risks, benefits and alternatives for the proposed anesthesia with the patient or authorized representative who has indicated his/her understanding and acceptance.     Plan Discussed with:   Anesthesia Plan Comments:         Anesthesia Quick Evaluation

## 2015-02-20 NOTE — Anesthesia Postprocedure Evaluation (Signed)
  Anesthesia Post-op Note  Patient: Laura Reed  Procedure(s) Performed: Procedure(s): Pelvic Angiography (Left)  Anesthesia type:General  Patient location: PACU  Post pain: Pain level controlled  Post assessment: Post-op Vital signs reviewed, Patient's Cardiovascular Status Stable, Respiratory Function Stable, Patent Airway and No signs of Nausea or vomiting  Post vital signs: Reviewed and stable  Last Vitals:  Filed Vitals:   02/20/15 1216  BP: 110/76  Pulse: 66  Temp: 36.1 C  Resp: 15    Level of consciousness: awake, alert  and patient cooperative  Complications: No apparent anesthesia complications

## 2015-02-20 NOTE — H&P (Signed)
[facility] History & Physical Update  The patient was interviewed and re-examined.  The patient's previous History and Physical has been reviewed and is unchanged.  There is no change in the plan of care. We plan to proceed with the scheduled procedure.  [name], Dolores Lory, MD  02/20/2015, 11:43 AM

## 2015-02-20 NOTE — Transfer of Care (Signed)
Immediate Anesthesia Transfer of Care Note  Patient: Laura Reed  Procedure(s) Performed: Procedure(s): Pelvic Angiography (Left)  Patient Location: PACU  Anesthesia Type:General  Level of Consciousness: awake, alert , oriented and patient cooperative  Airway & Oxygen Therapy: Patient Spontanous Breathing and Patient connected to nasal cannula oxygen  Post-op Assessment: Report given to RN and Post -op Vital signs reviewed and stable  Post vital signs: Reviewed and stable  Last Vitals:  Filed Vitals:   02/20/15 1027  BP: 118/63  Pulse:   Temp: 36.2 C  Resp: 16    Complications: No apparent anesthesia complications

## 2015-02-20 NOTE — OR Nursing (Signed)
Noted left groin gauze/tegaderm partially saturated with blood and leaking.  No hematoma noted at site or pain. Vascular lab notified and Darnelle Maffucci at bedside to hold pressure x 10 minutes and new gauze dressing applied.

## 2015-02-21 ENCOUNTER — Telehealth: Payer: Self-pay | Admitting: Internal Medicine

## 2015-02-21 ENCOUNTER — Encounter: Payer: Self-pay | Admitting: Vascular Surgery

## 2015-02-21 NOTE — Telephone Encounter (Signed)
Patient scheduled to see MD tomorrow at 2.30

## 2015-02-21 NOTE — Telephone Encounter (Signed)
Pt called to check the status of the Diabetic shoes. Pt dropped off the forms on 02/15/2015. Pt states she needs them by 02/23/2015. Thank You!

## 2015-02-22 ENCOUNTER — Ambulatory Visit (INDEPENDENT_AMBULATORY_CARE_PROVIDER_SITE_OTHER): Payer: Medicare Other | Admitting: Internal Medicine

## 2015-02-22 ENCOUNTER — Encounter: Payer: Self-pay | Admitting: Internal Medicine

## 2015-02-22 VITALS — BP 118/58 | HR 74 | Temp 98.5°F | Resp 14 | Ht 66.0 in | Wt 172.5 lb

## 2015-02-22 DIAGNOSIS — E1142 Type 2 diabetes mellitus with diabetic polyneuropathy: Secondary | ICD-10-CM | POA: Diagnosis not present

## 2015-02-22 DIAGNOSIS — G629 Polyneuropathy, unspecified: Secondary | ICD-10-CM | POA: Diagnosis not present

## 2015-02-22 NOTE — Progress Notes (Signed)
Pre-visit discussion using our clinic review tool. No additional management support is needed unless otherwise documented below in the visit note.  

## 2015-02-22 NOTE — Progress Notes (Signed)
Subjective:  Patient ID: Laura Reed, female    DOB: 29-Jan-1961  Age: 54 y.o. MRN: [id number]  CC: There were no encounter diagnoses.  HPI Laura Reed presents for need for diabetic shoes and inserts  2) painful right arm after a fall against a coffee table while bent over on toes,  Fell forward,  Was not dizzy.  Arm is bruised and has a superficial laceration   Outpatient Prescriptions Prior to Visit  Medication Sig Dispense Refill  . ACCU-CHEK AVIVA PLUS test strip CHECK  BLOOD  SUGAR    TWO TO  THREE  TIMES  A  DAY  6  . ALPRAZolam (XANAX) 0.5 MG tablet Take 1 tablet (0.5 mg total) by mouth 3 (three) times daily as needed. 90 tablet 1  . aspirin 81 MG chewable tablet Chew 81 mg by mouth daily.    Marland Kitchen atorvastatin (LIPITOR) 20 MG tablet TAKE 1 TABLET BY MOUTH EVERY DAY 90 tablet 1  . B-D ULTRAFINE III SHORT PEN 31G X 8 MM MISC TEST BLOOD SUGAR TWICE DAILY 100 each 5  . Biotin 1000 MCG tablet Take 1,000 mcg by mouth daily.     . Cholecalciferol (VITAMIN D3) 2000 UNITS TABS Take 1 tablet by mouth daily.     . clopidogrel (PLAVIX) 75 MG tablet TAKE 1 TABLET BY MOUTH EVERY DAY 90 tablet 0  . DIGOX 125 MCG tablet TAKE 1 TABLET BY MOUTH EVERY DAY 90 tablet 1  . gabapentin (NEURONTIN) 600 MG tablet Take 1 tablet (600 mg total) by mouth 3 (three) times daily. 270 tablet 3  . Insulin Detemir (LEVEMIR FLEXTOUCH) 100 UNIT/ML Pen Inject 40 Units into the skin daily at 10 pm. 15 mL 5  . Insulin Lispro Prot & Lispro (HUMALOG 75/25 MIX) (75-25) 100 UNIT/ML Kwikpen 30 units in the am 36 units in the pm (Patient taking differently: Inject 30-36 Units into the skin 2 (two) times daily. Pt uses 30 units in the morning and 36 units in the evening.) 30 mL 11  . lisinopril (PRINIVIL,ZESTRIL) 5 MG tablet Take 1 tablet (5 mg total) by mouth daily. 90 tablet 3  . vitamin C (ASCORBIC ACID) 500 MG tablet Take 1,000 mg by mouth 2 (two) times daily.     No facility-administered medications prior to visit.     Review of Systems;  Patient denies headache, fevers, malaise, unintentional weight loss, skin rash, eye pain, sinus congestion and sinus pain, sore throat, dysphagia,  hemoptysis , cough, dyspnea, wheezing, chest pain, palpitations, orthopnea, edema, abdominal pain, nausea, melena, diarrhea, constipation, flank pain, dysuria, hematuria, urinary  Frequency, nocturia, numbness, tingling, seizures,  Focal weakness, Loss of consciousness,  Tremor, insomnia, depression, anxiety, and suicidal ideation.      Objective:  BP 118/58 mmHg  Pulse 74  Temp(Src) 98.5 F (36.9 C) (Oral)  Resp 14  Ht 5\' 6"  (1.676 m)  Wt 172 lb 8 oz (78.245 kg)  BMI 27.86 kg/m2  SpO2 96%  BP Readings from Last 3 Encounters:  02/22/15 118/58  02/20/15 146/61  02/09/15 127/70    Wt Readings from Last 3 Encounters:  02/22/15 172 lb 8 oz (78.245 kg)  02/20/15 169 lb (76.658 kg)  02/09/15 167 lb 8 oz (75.978 kg)    General appearance: alert, cooperative and appears stated age Back: symmetric, no curvature. ROM normal. No CVA tenderness. Lungs: clear to auscultation bilaterally Heart: regular rate and rhythm, S1, S2 normal, no murmur, click, rub or gallop Abdomen: soft, non-tender;  bowel sounds normal; no masses,  no organomegaly Pulses: decreased DP on rigtht,  Intact on left  Skin: Skin color, texture, turgor normal. No rashes or lesions Lymph nodes: Cervical, supraclavicular, and axillary nodes normal. Feet: missing left great toe, 2 other toes on left foot.   Lab Results  Component Value Date   HGBA1C 7.5* 01/24/2015   HGBA1C 7.2* 10/04/2014   HGBA1C 6.4 06/16/2014    Lab Results  Component Value Date   CREATININE 1.26* 02/20/2015   CREATININE 1.08 01/24/2015   CREATININE 1.21* 11/02/2014    Lab Results  Component Value Date   WBC 12.8* 11/02/2014   HGB 9.5* 11/02/2014   HCT 29.5* 11/02/2014   PLT 217 11/02/2014   GLUCOSE 79 02/20/2015   CHOL 120 06/16/2014   TRIG 132.0 06/16/2014    HDL 36.00* 06/16/2014   LDLDIRECT 71.0 10/04/2014   LDLCALC 58 06/16/2014   ALT 15 01/24/2015   AST 16 01/24/2015   NA 141 02/20/2015   K 4.7 02/20/2015   CL 106 02/20/2015   CREATININE 1.26* 02/20/2015   BUN 42* 02/20/2015   CO2 27 02/20/2015   TSH 1.03 03/17/2014   INR 0.9 10/17/2014   HGBA1C 7.5* 01/24/2015   MICROALBUR 4.3* 06/16/2014    No results found.  Assessment & Plan:   Problem List Items Addressed This Visit    None      I am having Ms. Stukes maintain her Vitamin D3, vitamin C, Biotin, gabapentin, atorvastatin, lisinopril, Insulin Lispro Prot & Lispro, DIGOX, B-D ULTRAFINE III SHORT PEN, clopidogrel, aspirin, Insulin Detemir, ALPRAZolam, and ACCU-CHEK AVIVA PLUS.  No orders of the defined types were placed in this encounter.    There are no discontinued medications.  Follow-up: No Follow-up on file.   [name], MD

## 2015-02-24 NOTE — Assessment & Plan Note (Signed)
She has had 3 amputations , both feet affected, with resulting acquired deformity and has little to no feeling on the soles of his feet.  Diabetic shoes and inserts are needed to prevent pressure ulcers

## 2015-02-26 DIAGNOSIS — I25810 Atherosclerosis of coronary artery bypass graft(s) without angina pectoris: Secondary | ICD-10-CM | POA: Diagnosis not present

## 2015-02-26 DIAGNOSIS — I739 Peripheral vascular disease, unspecified: Secondary | ICD-10-CM | POA: Diagnosis not present

## 2015-02-26 DIAGNOSIS — E782 Mixed hyperlipidemia: Secondary | ICD-10-CM | POA: Diagnosis not present

## 2015-02-26 LAB — GLUCOSE, CAPILLARY
Glucose-Capillary: 29 mg/dL — CL (ref 65–99)
Glucose-Capillary: 83 mg/dL (ref 65–99)

## 2015-03-08 DIAGNOSIS — I739 Peripheral vascular disease, unspecified: Secondary | ICD-10-CM | POA: Diagnosis not present

## 2015-03-08 DIAGNOSIS — M79609 Pain in unspecified limb: Secondary | ICD-10-CM | POA: Diagnosis not present

## 2015-03-08 DIAGNOSIS — N184 Chronic kidney disease, stage 4 (severe): Secondary | ICD-10-CM | POA: Diagnosis not present

## 2015-03-08 DIAGNOSIS — I10 Essential (primary) hypertension: Secondary | ICD-10-CM | POA: Diagnosis not present

## 2015-03-09 DIAGNOSIS — Z794 Long term (current) use of insulin: Secondary | ICD-10-CM | POA: Diagnosis not present

## 2015-03-09 DIAGNOSIS — B351 Tinea unguium: Secondary | ICD-10-CM | POA: Diagnosis not present

## 2015-03-09 DIAGNOSIS — L851 Acquired keratosis [keratoderma] palmaris et plantaris: Secondary | ICD-10-CM | POA: Diagnosis not present

## 2015-03-09 DIAGNOSIS — E1140 Type 2 diabetes mellitus with diabetic neuropathy, unspecified: Secondary | ICD-10-CM | POA: Diagnosis not present

## 2015-03-12 ENCOUNTER — Other Ambulatory Visit: Payer: Self-pay | Admitting: Internal Medicine

## 2015-03-29 ENCOUNTER — Ambulatory Visit (INDEPENDENT_AMBULATORY_CARE_PROVIDER_SITE_OTHER): Payer: Medicare Other | Admitting: General Surgery

## 2015-03-29 ENCOUNTER — Encounter: Payer: Self-pay | Admitting: General Surgery

## 2015-03-29 VITALS — BP 136/74 | Ht 66.0 in | Wt 175.0 lb

## 2015-03-29 DIAGNOSIS — K439 Ventral hernia without obstruction or gangrene: Secondary | ICD-10-CM

## 2015-03-29 NOTE — Patient Instructions (Signed)
Patient to return as needed. 

## 2015-03-29 NOTE — Progress Notes (Signed)
Patient ID: Laura Reed, female   DOB: June 16, 1961, 54 y.o.   MRN: [id number]  Chief Complaint  Patient presents with  . Follow-up    ventral hernia    HPI Laura Reed is a 54 y.o. female here today for her follow up from her ventral hernia repair done in April, 2016. Patient states she is doing well with no new problems. HPI   Past Medical History  Diagnosis Date  . Diabetes mellitus   . Hypertension   . Hemorrhoid   . PVD (peripheral vascular disease)   . Heart murmur   . CHF (congestive heart failure)   . Coronary artery disease   . Cancer   . Urothelial carcinoma of kidney 10/31/2014    INVASIVE UROTHELIAL CARCINOMA, LOW GRADE. T1, Nx.    Past Surgical History  Procedure Laterality Date  . Coronary artery bypass graft  2009    3 vessel  . Arterial bypass surgry  2009, 2013 x 2    right leg , done in [place]  . Cesarean section    . Carotid endarterectomy  July 2015    Dr Delana Meyer  . Cholecystectomy  03-03-12    Porcelain gallbladder, gallstones,  [place]  . Colonoscopy w/ biopsies  04/28/2012    Hyperplastic rectal polyps.  . Nephrectomy Left 10-31-14  . Hernia repair  10-31-14    ventral, retro-rectus atrium mesh  . Amputation toe    . Cataract extraction w/phaco Right 12/14/2014    Procedure: CATARACT EXTRACTION PHACO AND INTRAOCULAR LENS PLACEMENT (IOC);  Surgeon: Lyla Glassing, MD;  Location: [facility];  Service: Ophthalmology;  Laterality: Right;  [place]   00:38.6              AP        7.1                   CDE  2.76  . Peripheral vascular catheterization Left 02/20/2015    Procedure: Pelvic Angiography;  Surgeon: Katha Cabal, MD;  Location: [facility];  Service: Cardiovascular;  Laterality: Left;    Family History  Problem Relation Age of Onset  . Cancer Mother 32    Lung Cancer  . Cancer Father 26    Lung Ca    Social History Social History  Substance Use Topics  . Smoking status: Former Smoker    Types: Cigarettes    Quit date:  03/30/2011  . Smokeless tobacco: Never Used  . Alcohol Use: No    No Known Allergies  Current Outpatient Prescriptions  Medication Sig Dispense Refill  . ACCU-CHEK AVIVA PLUS test strip CHECK  BLOOD  SUGAR    TWO TO  THREE  TIMES  A  DAY  6  . ALPRAZolam (XANAX) 0.5 MG tablet Take 1 tablet (0.5 mg total) by mouth 3 (three) times daily as needed. 90 tablet 1  . aspirin 81 MG chewable tablet Chew 81 mg by mouth daily.    Marland Kitchen atorvastatin (LIPITOR) 20 MG tablet TAKE 1 TABLET BY MOUTH EVERY DAY 90 tablet 1  . B-D ULTRAFINE III SHORT PEN 31G X 8 MM MISC TEST BLOOD SUGAR TWICE DAILY 100 each 5  . Biotin 1000 MCG tablet Take 1,000 mcg by mouth daily.     . Cholecalciferol (VITAMIN D3) 2000 UNITS TABS Take 1 tablet by mouth daily.     . clopidogrel (PLAVIX) 75 MG tablet TAKE 1 TABLET BY MOUTH EVERY DAY 90 tablet 0  . [place]  125 MCG tablet TAKE 1 TABLET BY MOUTH EVERY DAY 90 tablet 1  . gabapentin (NEURONTIN) 600 MG tablet Take 1 tablet (600 mg total) by mouth 3 (three) times daily. 270 tablet 3  . Insulin Detemir (LEVEMIR FLEXTOUCH) 100 UNIT/ML Pen Inject 40 Units into the skin daily at 10 pm. 15 mL 5  . Insulin Lispro Prot & Lispro (HUMALOG 75/25 MIX) (75-25) 100 UNIT/ML Kwikpen 30 units in the am 36 units in the pm (Patient taking differently: Inject 30-36 Units into the skin 2 (two) times daily. Pt uses 30 units in the morning and 36 units in the evening.) 30 mL 11  . lisinopril (PRINIVIL,ZESTRIL) 5 MG tablet Take 1 tablet (5 mg total) by mouth daily. 90 tablet 3  . vitamin C (ASCORBIC ACID) 500 MG tablet Take 1,000 mg by mouth 2 (two) times daily.     No current facility-administered medications for this visit.    Review of Systems Review of Systems  Constitutional: Negative.   Respiratory: Negative.   Cardiovascular: Negative.     Blood pressure 136/74, height 5\' 6"  (1.676 m), weight 175 lb (79.379 kg).  Physical Exam Physical Exam  Constitutional: She is oriented to person, place,  and time. She appears well-developed and well-nourished.  Abdominal:    Ventral hernia repair is well healed.  Neurological: She is alert and oriented to person, place, and time.  Skin: Skin is warm and dry.      Assessment    Doing well status post ventral hernia repair with retro--rectus mesh placement.    Plan    Care with strenuous activity was encouraged. Patient will follow up when necessary as-needed basis.   Patient to return as needed.  PCP:  Wende Mott 03/30/2015, 9:43 PM

## 2015-03-30 DIAGNOSIS — E119 Type 2 diabetes mellitus without complications: Secondary | ICD-10-CM | POA: Diagnosis not present

## 2015-04-02 ENCOUNTER — Other Ambulatory Visit: Payer: Self-pay | Admitting: Internal Medicine

## 2015-04-13 ENCOUNTER — Other Ambulatory Visit: Payer: Self-pay | Admitting: Internal Medicine

## 2015-04-16 DIAGNOSIS — E113511 Type 2 diabetes mellitus with proliferative diabetic retinopathy with macular edema, right eye: Secondary | ICD-10-CM | POA: Diagnosis not present

## 2015-04-19 DIAGNOSIS — M79609 Pain in unspecified limb: Secondary | ICD-10-CM | POA: Diagnosis not present

## 2015-04-19 DIAGNOSIS — I739 Peripheral vascular disease, unspecified: Secondary | ICD-10-CM | POA: Diagnosis not present

## 2015-04-19 DIAGNOSIS — I10 Essential (primary) hypertension: Secondary | ICD-10-CM | POA: Diagnosis not present

## 2015-04-20 DIAGNOSIS — L97521 Non-pressure chronic ulcer of other part of left foot limited to breakdown of skin: Secondary | ICD-10-CM | POA: Diagnosis not present

## 2015-04-20 DIAGNOSIS — M2040 Other hammer toe(s) (acquired), unspecified foot: Secondary | ICD-10-CM | POA: Diagnosis not present

## 2015-04-24 ENCOUNTER — Other Ambulatory Visit: Payer: Self-pay | Admitting: Vascular Surgery

## 2015-04-27 ENCOUNTER — Other Ambulatory Visit
Admission: RE | Admit: 2015-04-27 | Discharge: 2015-04-27 | Disposition: A | Discharge status: Home / Self Care | Payer: Medicare Other | Source: Ambulatory Visit | Attending: Vascular Surgery | Admitting: Vascular Surgery

## 2015-04-27 DIAGNOSIS — E1121 Type 2 diabetes mellitus with diabetic nephropathy: Secondary | ICD-10-CM | POA: Insufficient documentation

## 2015-04-27 DIAGNOSIS — E785 Hyperlipidemia, unspecified: Secondary | ICD-10-CM | POA: Diagnosis not present

## 2015-04-27 DIAGNOSIS — I10 Essential (primary) hypertension: Secondary | ICD-10-CM | POA: Insufficient documentation

## 2015-04-27 DIAGNOSIS — E1165 Type 2 diabetes mellitus with hyperglycemia: Secondary | ICD-10-CM | POA: Insufficient documentation

## 2015-04-27 DIAGNOSIS — E1151 Type 2 diabetes mellitus with diabetic peripheral angiopathy without gangrene: Secondary | ICD-10-CM | POA: Diagnosis not present

## 2015-04-27 DIAGNOSIS — E1142 Type 2 diabetes mellitus with diabetic polyneuropathy: Secondary | ICD-10-CM | POA: Diagnosis present

## 2015-04-27 LAB — CREATININE, SERUM
CREATININE: 1.09 mg/dL — AB (ref 0.44–1.00)
GFR calc Af Amer: >60 mL/min (ref >60)
GFR calc non Af Amer: 56 mL/min — ABNORMAL LOW (ref >60)

## 2015-04-27 LAB — BUN: BUN: 37 mg/dL — ABNORMAL HIGH (ref 6–20)

## 2015-04-30 ENCOUNTER — Ambulatory Visit (INDEPENDENT_AMBULATORY_CARE_PROVIDER_SITE_OTHER): Payer: Medicare Other | Admitting: Internal Medicine

## 2015-04-30 ENCOUNTER — Encounter: Payer: Self-pay | Admitting: Internal Medicine

## 2015-04-30 VITALS — BP 158/72 | HR 78 | Temp 98.2°F | Resp 12 | Ht 66.0 in | Wt 173.5 lb

## 2015-04-30 DIAGNOSIS — E1142 Type 2 diabetes mellitus with diabetic polyneuropathy: Secondary | ICD-10-CM | POA: Diagnosis not present

## 2015-04-30 DIAGNOSIS — Z1239 Encounter for other screening for malignant neoplasm of breast: Secondary | ICD-10-CM | POA: Diagnosis not present

## 2015-04-30 DIAGNOSIS — E1169 Type 2 diabetes mellitus with other specified complication: Secondary | ICD-10-CM

## 2015-04-30 DIAGNOSIS — E1151 Type 2 diabetes mellitus with diabetic peripheral angiopathy without gangrene: Secondary | ICD-10-CM | POA: Diagnosis not present

## 2015-04-30 DIAGNOSIS — E1165 Type 2 diabetes mellitus with hyperglycemia: Secondary | ICD-10-CM

## 2015-04-30 DIAGNOSIS — I739 Peripheral vascular disease, unspecified: Secondary | ICD-10-CM

## 2015-04-30 DIAGNOSIS — IMO0002 Reserved for concepts with insufficient information to code with codable children: Secondary | ICD-10-CM

## 2015-04-30 DIAGNOSIS — J069 Acute upper respiratory infection, unspecified: Secondary | ICD-10-CM

## 2015-04-30 DIAGNOSIS — E1121 Type 2 diabetes mellitus with diabetic nephropathy: Secondary | ICD-10-CM

## 2015-04-30 DIAGNOSIS — E785 Hyperlipidemia, unspecified: Secondary | ICD-10-CM

## 2015-04-30 DIAGNOSIS — I10 Essential (primary) hypertension: Secondary | ICD-10-CM

## 2015-04-30 LAB — COMPREHENSIVE METABOLIC PANEL
ALBUMIN: 3.8 g/dL (ref 3.5–5.2)
ALT: 15 U/L (ref 0–35)
AST: 14 U/L (ref 0–37)
Alkaline Phosphatase: 82 U/L (ref 39–117)
BUN: 37 mg/dL — ABNORMAL HIGH (ref 6–23)
CALCIUM: 9.1 mg/dL (ref 8.4–10.5)
CHLORIDE: 104 meq/L (ref 96–112)
CO2: 27 mEq/L (ref 19–32)
CREATININE: 1.23 mg/dL — AB (ref 0.40–1.20)
GFR: 47.9 mL/min — ABNORMAL LOW (ref >60.00)
Glucose, Bld: 218 mg/dL — ABNORMAL HIGH (ref 70–99)
Potassium: 5 mEq/L (ref 3.5–5.1)
Sodium: 138 mEq/L (ref 135–145)
TOTAL PROTEIN: 7 g/dL (ref 6.0–8.3)
Total Bilirubin: 0.3 mg/dL (ref 0.2–1.2)

## 2015-04-30 LAB — HEMOGLOBIN A1C: Hgb A1c MFr Bld: 6.5 % (ref 4.6–6.5)

## 2015-04-30 MED ORDER — PREDNISONE 10 MG PO TABS
ORAL_TABLET | ORAL | Status: DC
Start: 2015-04-30 — End: 2015-05-15

## 2015-04-30 NOTE — Patient Instructions (Addendum)
I am prescribing a prednisone taper to help the inflammation in your sinuses and ears   You can also use Afrin nasal spray  Every 12 hours for a  Maximum  For 5 days   If no improvement in 3 or 4 days,  Or if you spoke a fever (T > 100.4) ,  call for antibiotic   Please Increase your lisinopril to 10 mg daily   If BP is not 130/80 in 2 weeks ,  Call for adjustment   Goal is 130/80 or less ,  But not lower than 90/60    Continue 2 stool softeners and benefiber daily

## 2015-04-30 NOTE — Progress Notes (Signed)
Subjective:  Patient ID: Laura Reed, female    DOB: 12-18-1960  Age: 54 y.o. MRN: [id number]  CC: The primary encounter diagnosis was Diabetic peripheral neuropathy associated with type 2 diabetes mellitus ([place]). Diagnoses of Breast cancer screening, Uncontrolled type 2 diabetes mellitus with peripheral circulatory disorder ([place]), PVD (peripheral vascular disease) ([place]), Essential hypertension, Diabetic nephropathy with proteinuria ([place]), Viral URI, and Hyperlipidemia associated with type 2 diabetes mellitus ([place]) were also pertinent to this visit.  HPI Laura Reed presents for  3 month follow up on DIABETES MELLITUS with PAD .  She is currently Using levemir 36 units daily with good results in readings,  Denies any lows,    Had stent placed on SePt 16th in left inguinal area .  Another one tomorrow for left leg popliteal stenosis   Sees podiatry every 6 weeks for calllous management given prior amputations resulting in deformities.    Congestion and rhinitis: sinus symptoms present for  the past 3-4 days  Some ear pressure . Cant use steroid spray because of septal perforations from snorting cocaine for 5 years,  Having Quit 10 years ago .  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    CKD has improved.   Taking 2 stool softeners daily for constipation.     No facility-administered medications prior to visit.   Outpatient Prescriptions Prior to Visit  Medication Sig Dispense Refill  . ACCU-CHEK AVIVA PLUS test strip CHECK  BLOOD  SUGAR    TWO TO  THREE  TIMES  A  DAY  6  . ALPRAZolam (XANAX) 0.5 MG tablet Take 1 tablet (0.5 mg total) by mouth 3 (three) times daily as needed. 90 tablet 1  . aspirin 81 MG chewable tablet Chew 81 mg by mouth daily.    Marland Kitchen atorvastatin (LIPITOR) 20 MG tablet TAKE 1  TABLET BY MOUTH EVERY DAY 90 tablet 1  . B-D ULTRAFINE III SHORT PEN 31G X 8 MM MISC TEST BLOOD SUGAR TWICE DAILY 100 each 5  . Biotin 1000 MCG tablet Take 1,000 mcg by mouth daily.     . Cholecalciferol (VITAMIN D3) 2000 UNITS TABS Take 1 tablet by mouth daily.     . clopidogrel (PLAVIX) 75 MG tablet TAKE 1 TABLET BY MOUTH EVERY DAY 90 tablet 0  . DIGOX 125 MCG tablet TAKE 1 TABLET BY MOUTH EVERY DAY 90 tablet 1  . gabapentin (NEURONTIN) 600 MG tablet TAKE 1 TABLET BY MOUTH THREE TIMES DAILY 270 tablet 1  . Insulin Detemir (LEVEMIR FLEXTOUCH) 100 UNIT/ML Pen Inject 40 Units into the skin daily at 10 pm. 15 mL 5  . Insulin Lispro Prot & Lispro (HUMALOG 75/25 MIX) (75-25) 100 UNIT/ML Kwikpen 30 units in the am 36 units in the pm (Patient taking differently: Inject 30-36 Units into the skin 2 (two) times daily. Pt uses 30 units in the morning and 36 units in the evening.) 30 mL 11  . lisinopril (PRINIVIL,ZESTRIL) 5 MG tablet Take 1 tablet (5 mg total) by mouth daily. (Patient taking differently: Take 10 mg by mouth daily. ) 90 tablet 3  . vitamin C (ASCORBIC ACID) 500 MG tablet Take 1,000 mg by mouth 2 (two) times daily.      Review of Systems;  Patient denies headache, fevers, malaise, unintentional weight loss, skin rash, eye  pain, sinus congestion and sinus pain, sore throat, dysphagia,  hemoptysis , cough, dyspnea, wheezing, chest pain, palpitations, orthopnea, edema, abdominal pain, nausea, melena, diarrhea, constipation, flank pain, dysuria, hematuria, urinary  Frequency, nocturia, numbness, tingling, seizures,  Focal weakness, Loss of consciousness,  Tremor, insomnia, depression, anxiety, and suicidal ideation.      Objective:  BP 158/72 mmHg  Pulse 78  Temp(Src) 98.2 F (36.8 C) (Oral)  Resp 12  Ht 5\' 6"  (1.676 m)  Wt 173 lb 8 oz (78.699 kg)  BMI 28.02 kg/m2  SpO2 98%  BP Readings from Last 3 Encounters:  05/01/15 126/52  04/30/15 158/72  03/29/15 136/74    Wt Readings from  Last 3 Encounters:  05/01/15 170 lb (77.111 kg)  04/30/15 173 lb 8 oz (78.699 kg)  03/29/15 175 lb (79.379 kg)    General appearance: alert, cooperative and appears stated age Ears: normal TM's and external ear canals both ears Throat: lips, mucosa, and tongue normal; teeth and gums normal Neck: no adenopathy, no carotid bruit, supple, symmetrical, trachea midline and thyroid not enlarged, symmetric, no tenderness/mass/nodules Back: symmetric, no curvature. ROM normal. No CVA tenderness. Lungs: clear to auscultation bilaterally Heart: regular rate and rhythm, S1, S2 normal, no murmur, click, rub or gallop Abdomen: soft, non-tender; bowel sounds normal; no masses,  no organomegaly Pulses: 2+ and symmetric Skin: Skin color, texture, turgor normal. No rashes or lesions Lymph nodes: Cervical, supraclavicular, and axillary nodes normal.  Lab Results  Component Value Date   HGBA1C 6.5 04/30/2015   HGBA1C 7.5* 01/24/2015   HGBA1C 7.2* 10/04/2014    Lab Results  Component Value Date   CREATININE 1.23* 04/30/2015   CREATININE 1.09* 04/27/2015   CREATININE 1.26* 02/20/2015    Lab Results  Component Value Date   WBC 12.8* 11/02/2014   HGB 9.5* 11/02/2014   HCT 29.5* 11/02/2014   PLT 217 11/02/2014   GLUCOSE 218* 04/30/2015   CHOL 120 06/16/2014   TRIG 132.0 06/16/2014   HDL 36.00* 06/16/2014   LDLDIRECT 71.0 10/04/2014   LDLCALC 58 06/16/2014   ALT 15 04/30/2015   AST 14 04/30/2015   NA 138 04/30/2015   K 5.0 04/30/2015   CL 104 04/30/2015   CREATININE 1.23* 04/30/2015   BUN 37* 04/30/2015   CO2 27 04/30/2015   TSH 1.03 03/17/2014   INR 0.9 10/17/2014   HGBA1C 6.5 04/30/2015   MICROALBUR 4.3* 06/16/2014    No results found.  Assessment & Plan:   Problem List Items Addressed This Visit    Uncontrolled type 2 diabetes mellitus with peripheral circulatory disorder ([place])     well-controlled on current medications. With some loss of control due to recent surgeries, now  resolved on 36 units Levemir daily    . Patient is up-to-date on eye exams and foot exam is unchanged today. Patient  Has had  urine microalbumin to creatinine ratio done within the past year. Patient is tolerating statin therapy for CAD risk reduction and on ACE/ARB for reduction in proteinuria.   Lab Results  Component Value Date   HGBA1C 6.5 04/30/2015   Lab Results  Component Value Date   MICROALBUR 4.3* 06/16/2014           Hypertension    Not at goal,  Increased lisinopril to 10 mg daily .  Patient advised to recheck BP in 2 weeks and advise office of resutls.       PVD (peripheral vascular disease) ([facility])    Severe, with recurrent stenosis  resulting in loss of toes due to gangrene. Secondary to DM and history of tobacco abuse   Recent stent placement discussed,  Has another procedure tomorrow for popliteal clot.  Continue aspirin, statin and Plavix,  Not smoking       Diabetic peripheral neuropathy associated with type 2 diabetes mellitus ([place]) - Primary    complicated by PAD< Resulting in prior foot ulcers and osteomyelitis . Now managed with pdiatriy follow up every 6 weeks       Relevant Orders   Hemoglobin A1c (Completed)   Comprehensive metabolic panel (Completed)   Microalbumin / creatinine urine ratio   Hyperlipidemia associated with type 2 diabetes mellitus ([place])    .Well controlled on current statin therapy.   Liver enzymes are normal , no changes today.  Lab Results  Component Value Date   CHOL 120 06/16/2014   HDL 36.00* 06/16/2014   LDLCALC 58 06/16/2014   LDLDIRECT 71.0 10/04/2014   TRIG 132.0 06/16/2014   CHOLHDL 3 06/16/2014   Lab Results  Component Value Date   ALT 15 04/30/2015   AST 14 04/30/2015   ALKPHOS 82 04/30/2015   BILITOT 0.3 04/30/2015             Diabetic nephropathy with proteinuria ([facility])    Lisinopril dose increased today to 10 mg daily          Viral URI    URI is most likely viral given the mild HEENT  Symptoms  And  normal exam. Adding  nasal decongestants, and tylenol 650 mq 8 hrs for aches and pains,  And prednisone  taper for inflammation       Other Visit Diagnoses    Breast cancer screening        Relevant Orders    MM DIGITAL SCREENING BILATERAL       I am having Ms. Delafuente start on predniSONE. I am also having her maintain her Vitamin D3, vitamin C, Biotin, lisinopril, Insulin Lispro Prot & Lispro, DIGOX, B-D ULTRAFINE III SHORT PEN, aspirin, Insulin Detemir, ALPRAZolam, ACCU-CHEK AVIVA PLUS, clopidogrel, atorvastatin, and gabapentin.  Meds ordered this encounter  Medications  . predniSONE (DELTASONE) 10 MG tablet    Sig: 6 tablets on Day 1 , then reduce by 1 tablet daily until gone    Dispense:  21 tablet    Refill:  0    There are no discontinued medications.  Follow-up: Return in about 3 months (around 07/31/2015).   [name], MD

## 2015-04-30 NOTE — Progress Notes (Signed)
Pre-visit discussion using our clinic review tool. No additional management support is needed unless otherwise documented below in the visit note.  

## 2015-05-01 ENCOUNTER — Ambulatory Visit: Payer: Medicare Other | Admitting: Certified Registered Nurse Anesthetist

## 2015-05-01 ENCOUNTER — Encounter
Admission: RE | Disposition: A | Discharge status: Home / Self Care | Payer: Self-pay | Source: Ambulatory Visit | Attending: Vascular Surgery

## 2015-05-01 ENCOUNTER — Ambulatory Visit
Admission: RE | Admit: 2015-05-01 | Discharge: 2015-05-01 | Disposition: A | Discharge status: Home / Self Care | Payer: Medicare Other | Source: Ambulatory Visit | Attending: Vascular Surgery | Admitting: Vascular Surgery

## 2015-05-01 ENCOUNTER — Encounter: Payer: Self-pay | Admitting: *Deleted

## 2015-05-01 DIAGNOSIS — I10 Essential (primary) hypertension: Secondary | ICD-10-CM | POA: Insufficient documentation

## 2015-05-01 DIAGNOSIS — E669 Obesity, unspecified: Secondary | ICD-10-CM | POA: Diagnosis not present

## 2015-05-01 DIAGNOSIS — Z87891 Personal history of nicotine dependence: Secondary | ICD-10-CM | POA: Insufficient documentation

## 2015-05-01 DIAGNOSIS — E119 Type 2 diabetes mellitus without complications: Secondary | ICD-10-CM | POA: Diagnosis not present

## 2015-05-01 DIAGNOSIS — Z794 Long term (current) use of insulin: Secondary | ICD-10-CM | POA: Diagnosis not present

## 2015-05-01 DIAGNOSIS — K8020 Calculus of gallbladder without cholecystitis without obstruction: Secondary | ICD-10-CM | POA: Insufficient documentation

## 2015-05-01 DIAGNOSIS — M1990 Unspecified osteoarthritis, unspecified site: Secondary | ICD-10-CM | POA: Insufficient documentation

## 2015-05-01 DIAGNOSIS — I6523 Occlusion and stenosis of bilateral carotid arteries: Secondary | ICD-10-CM | POA: Insufficient documentation

## 2015-05-01 DIAGNOSIS — E279 Disorder of adrenal gland, unspecified: Secondary | ICD-10-CM | POA: Insufficient documentation

## 2015-05-01 DIAGNOSIS — I6529 Occlusion and stenosis of unspecified carotid artery: Secondary | ICD-10-CM | POA: Diagnosis not present

## 2015-05-01 DIAGNOSIS — I70212 Atherosclerosis of native arteries of extremities with intermittent claudication, left leg: Secondary | ICD-10-CM | POA: Diagnosis not present

## 2015-05-01 DIAGNOSIS — Z6827 Body mass index (BMI) 27.0-27.9, adult: Secondary | ICD-10-CM | POA: Diagnosis not present

## 2015-05-01 DIAGNOSIS — Z79899 Other long term (current) drug therapy: Secondary | ICD-10-CM | POA: Diagnosis not present

## 2015-05-01 DIAGNOSIS — L97509 Non-pressure chronic ulcer of other part of unspecified foot with unspecified severity: Secondary | ICD-10-CM | POA: Insufficient documentation

## 2015-05-01 DIAGNOSIS — J069 Acute upper respiratory infection, unspecified: Secondary | ICD-10-CM | POA: Insufficient documentation

## 2015-05-01 DIAGNOSIS — I70222 Atherosclerosis of native arteries of extremities with rest pain, left leg: Secondary | ICD-10-CM | POA: Insufficient documentation

## 2015-05-01 HISTORY — PX: PERIPHERAL VASCULAR CATHETERIZATION: SHX172C

## 2015-05-01 LAB — GLUCOSE, CAPILLARY
GLUCOSE-CAPILLARY: 192 mg/dL — AB (ref 65–99)
Glucose-Capillary: 158 mg/dL — ABNORMAL HIGH (ref 65–99)

## 2015-05-01 SURGERY — LOWER EXTREMITY ANGIOGRAPHY
Anesthesia: General | Laterality: Left

## 2015-05-01 MED ORDER — MIDAZOLAM HCL 2 MG/2ML IJ SOLN
INTRAMUSCULAR | Status: DC | PRN
  Administered 2015-05-01: 2 mg via INTRAVENOUS

## 2015-05-01 MED ORDER — CHLORHEXIDINE GLUCONATE CLOTH 2 % EX PADS: 6.0000 | MEDICATED_PAD | Freq: Once | CUTANEOUS | Status: DC

## 2015-05-01 MED ORDER — LIDOCAINE-EPINEPHRINE (PF) 1 %-1:200000 IJ SOLN
INTRAMUSCULAR | Status: AC
Start: 2015-05-01 — End: 2015-05-01
  Filled 2015-05-01: qty 30

## 2015-05-01 MED ORDER — PHENYLEPHRINE HCL 10 MG/ML IJ SOLN
INTRAMUSCULAR | Status: DC | PRN
  Administered 2015-05-01: 50 ug via INTRAVENOUS
  Administered 2015-05-01 (×3): 100 ug via INTRAVENOUS
  Administered 2015-05-01: 50 ug via INTRAVENOUS

## 2015-05-01 MED ORDER — ACETAMINOPHEN 325 MG RE SUPP
325.0000 mg | RECTAL | Status: DC | PRN
  Filled 2015-05-01: qty 2

## 2015-05-01 MED ORDER — PROPOFOL 500 MG/50ML IV EMUL
INTRAVENOUS | Status: DC | PRN
  Administered 2015-05-01: 35 ug/kg/min via INTRAVENOUS

## 2015-05-01 MED ORDER — ONDANSETRON HCL 4 MG/2ML IJ SOLN: 4.0000 mg | Freq: Four times a day (QID) | INTRAMUSCULAR | Status: DC | PRN

## 2015-05-01 MED ORDER — HYDROMORPHONE HCL 1 MG/ML IJ SOLN: 0.5000 mg | INTRAMUSCULAR | Status: DC | PRN

## 2015-05-01 MED ORDER — GLYCOPYRROLATE 0.2 MG/ML IJ SOLN
INTRAMUSCULAR | Status: DC | PRN
  Administered 2015-05-01: 0.1 mg via INTRAVENOUS

## 2015-05-01 MED ORDER — SODIUM CHLORIDE 0.9 % IV SOLN: INTRAVENOUS | Status: DC | PRN

## 2015-05-01 MED ORDER — HEPARIN SODIUM (PORCINE) 1000 UNIT/ML IJ SOLN
INTRAMUSCULAR | Status: DC | PRN
  Administered 2015-05-01: 4000 [IU] via INTRAVENOUS

## 2015-05-01 MED ORDER — SODIUM CHLORIDE 0.9 % IV SOLN
INTRAVENOUS | Status: DC
  Administered 2015-05-01 (×2): via INTRAVENOUS

## 2015-05-01 MED ORDER — IOHEXOL 300 MG/ML  SOLN
INTRAMUSCULAR | Status: DC | PRN
  Administered 2015-05-01: 50 mL via INTRA_ARTERIAL

## 2015-05-01 MED ORDER — ACETAMINOPHEN 325 MG PO TABS: 325.0000 mg | ORAL_TABLET | ORAL | Status: DC | PRN

## 2015-05-01 MED ORDER — KETAMINE HCL 50 MG/ML IJ SOLN
INTRAMUSCULAR | Status: DC | PRN
  Administered 2015-05-01 (×2): 25 mg via INTRAMUSCULAR

## 2015-05-01 MED ORDER — DEXTROSE 5 % IV SOLN
INTRAVENOUS | Status: AC
  Filled 2015-05-01: qty 1.5

## 2015-05-01 MED ORDER — OXYCODONE HCL 5 MG PO TABS: 5.0000 mg | ORAL_TABLET | ORAL | Status: DC | PRN

## 2015-05-01 MED ORDER — INSULIN ASPART 100 UNIT/ML ~~LOC~~ SOLN: 0.0000 [IU] | SUBCUTANEOUS | Status: DC

## 2015-05-01 MED ORDER — HEPARIN (PORCINE) IN NACL 2-0.9 UNIT/ML-% IJ SOLN
INTRAMUSCULAR | Status: AC
  Filled 2015-05-01: qty 1000

## 2015-05-01 MED ORDER — FENTANYL CITRATE (PF) 100 MCG/2ML IJ SOLN
INTRAMUSCULAR | Status: DC | PRN
  Administered 2015-05-01 (×2): 25 ug via INTRAVENOUS
  Administered 2015-05-01: 50 ug via INTRAVENOUS

## 2015-05-01 MED ORDER — DEXTROSE 5 % IV SOLN
1.5000 g | INTRAVENOUS | Status: AC
  Administered 2015-05-01: 1.5 g via INTRAVENOUS

## 2015-05-01 SURGICAL SUPPLY — 19 items
BALLN LUTONIX DCB 4X100X130 (BALLOONS) ×3
BALLN LUTONIX DCB 6X80X130 (BALLOONS) ×3
BALLOON LUTONIX DCB 4X100X130 (BALLOONS) ×2 IMPLANT
BALLOON LUTONIX DCB 6X80X130 (BALLOONS) ×2 IMPLANT
CANNULA 5F STIFF (CANNULA) ×3 IMPLANT
CATH G XP .038X100 (CATHETERS) ×3 IMPLANT
CATH ROYAL FLUSH PIG 5F 70CM (CATHETERS) ×3 IMPLANT
DEVICE CLOSURE MYNXGRIP 5F (Vascular Products) ×3 IMPLANT
DEVICE PRESTO INFLATION (MISCELLANEOUS) ×3 IMPLANT
GUIDEWIRE SUPER STIFF .035X180 (WIRE) ×3 IMPLANT
PACK ANGIOGRAPHY (CUSTOM PROCEDURE TRAY) ×3 IMPLANT
SET INTRO CAPELLA COAXIAL (SET/KITS/TRAYS/PACK) ×3 IMPLANT
SHEATH ANL 5FRX45 (SHEATH) ×3 IMPLANT
SHEATH BRITE TIP 5FRX11 (SHEATH) ×3 IMPLANT
SYR MEDRAD MARK V 150ML (SYRINGE) ×3 IMPLANT
TOWEL OR 17X26 4PK STRL BLUE (TOWEL DISPOSABLE) ×3 IMPLANT
TUBING CONTRAST HIGH PRESS 72 (TUBING) ×3 IMPLANT
WIRE J 3MM .035X145CM (WIRE) ×3 IMPLANT
WIRE MAGIC TORQUE 260C (WIRE) ×3 IMPLANT

## 2015-05-01 NOTE — Assessment & Plan Note (Signed)
URI is most likely viral given the mild HEENT  Symptoms  And normal exam. Adding  nasal decongestants, and tylenol 650 mq 8 hrs for aches and pains,  And prednisone  taper for inflammation

## 2015-05-01 NOTE — Anesthesia Preprocedure Evaluation (Signed)
Anesthesia Evaluation  Patient identified by MRN, date of birth, ID band Patient awake    Reviewed: Allergy & Precautions, NPO status , Patient's Chart, lab work & pertinent test results  History of Anesthesia Complications Negative for: history of anesthetic complications  Airway Mallampati: II  TM Distance: >3 FB Neck ROM: Full    Dental  (+) Upper Dentures, Lower Dentures   Pulmonary former smoker,           Cardiovascular hypertension, Pt. on medications + CAD, + Peripheral Vascular Disease and +CHF  + dysrhythmias + Valvular Problems/Murmurs (pt c/o murmur)      Neuro/Psych  Neuromuscular disease    GI/Hepatic   Endo/Other  diabetes, Type 2, Insulin Dependent  Renal/GU Renal disease (l nephrectomy )     Musculoskeletal   Abdominal   Peds  Hematology   Anesthesia Other Findings   Reproductive/Obstetrics                             Anesthesia Physical  Anesthesia Plan  ASA: III  Anesthesia Plan: General   Post-op Pain Management:    Induction: Intravenous  Airway Management Planned:   Additional Equipment:   Intra-op Plan:   Post-operative Plan:   Informed Consent: I have reviewed the patients History and Physical, chart, labs and discussed the procedure including the risks, benefits and alternatives for the proposed anesthesia with the patient or authorized representative who has indicated his/her understanding and acceptance.     Plan Discussed with:   Anesthesia Plan Comments:         Anesthesia Quick Evaluation

## 2015-05-01 NOTE — Assessment & Plan Note (Signed)
Severe, with recurrent stenosis resulting in loss of toes due to gangrene. Secondary to DM and history of tobacco abuse   Recent stent placement discussed,  Has another procedure tomorrow for popliteal clot.  Continue aspirin, statin and Plavix,  Not smoking

## 2015-05-01 NOTE — Assessment & Plan Note (Signed)
complicated by PAD< Resulting in prior foot ulcers and osteomyelitis . Now managed with pdiatriy follow up every 6 weeks

## 2015-05-01 NOTE — Assessment & Plan Note (Signed)
Not at goal,  Increased lisinopril to 10 mg daily .  Patient advised to recheck BP in 2 weeks and advise office of resutls.

## 2015-05-01 NOTE — Op Note (Signed)
[facility]  Percutaneous Study/Intervention Procedural Note   Date of Surgery: 05/01/2015  Surgeon:Chaylee Ehrsam, Dolores Lory   Pre-operative Diagnosis: Atherosclerotic occlusive disease bilateral lower extremities with lifestyle limiting claudication and mild rest pain left lower extremity Post-operative diagnosis:  Same  Procedure(s) Performed:  1.  Left lower extremity angiography third order catheter placement  2.  Percutaneous transluminal angioplasty of the distal femoral popliteal bypass and popliteal artery  3.  Minx closure of the antegrade access site   Anesthesia: Mac anesthesia  Sheath: 5 French antegrade sheath left femoral popliteal bypass graft  Contrast: 50 cc  Fluoroscopy Time: 3.2 minutes  Indications:  Patient has had extensive revascularizations with multiple interventions. Recently was identified that she has a critical stenosis in the distal outflow from her femoral popliteal bypass graft this threatens the bypass graft patency. She notes that she has extreme difficulty with ambulation secondary to short distance claudication and significant lifestyle limitations. Given that her bypass graft is threatened by the popliteal stenosis and her symptoms are now progressive and limiting she is undergoing angiography with the hope for intervention  Procedure:  Laura Reed a 54 y.o. female who was identified and appropriate procedural time out was performed.  The patient was then placed supine on the table and prepped and draped in the usual sterile fashion.  Ultrasound was used to evaluate the left femoral popliteal PTFE bypass graft.  It was patent .  A digital ultrasound image was acquired.  A micropuncture needle was used to access the left femoral popliteal PTFE bypass graft under direct ultrasound guidance and a permanent image was performed.  Stiffening micropuncture sheath is then inserted over the microwire. A 0.035  Amplatz wire was advanced  without resistance and a 5Fr sheath was placed.    Hand injection contrast then used to demonstrate the distal outflow of the bypass and the lesion was localized. 100 cm glide catheter is then advanced over the Amplatz wire and the wire is exchanged for a Magic torque wire Magic torque wire and catheter are then negotiated into the tibioperoneal trunk where distal runoff is obtained. This represents third order catheter placement from an antegrade stick.  4000 units of heparin was given the glide catheter was removed and initially a 4 x 10 Lutonix balloon and subsequently a 6 x 8 Lutonix balloon was used to angioplasty the popliteal artery both inflations were to 10-12 atm and both were for 2-3 minutes. Follow-up imaging demonstrates less than 10% residual stenosis no evidence of dissection. Distal runoff is then reevaluated and is preserved.  Sheath is then exchanged for an 11 cm 5 French sheath and a minx device is deployed under fluoroscopic guidance. There no immediate Occasions.  Findings:   Left Lower Extremity:  The left common femoral and femoral popliteal bypass are widely patent. The distal anastomosis is actually patent approximately 1 cm distal to this there is a greater than 90% stenosis there is then diffuse greater than 70% disease down to the level of the tibial plateau the distal centimeter or 2 of the popliteal is patent and the anterior tibial is patent at its takeoff and remains patent throughout it's course and is the dominant runoff to the foot filling of DP. Tibioperoneal trunk is patent and the posterior tibial is patent in its proximal two thirds it appears to occlude at the level of the ankle. The peroneal is patent but it is quite small and contributes very small collaterals at the level of  the ankle.  Following angioplasty first to 4 mm and 6 mm there is now less than 10% residual stenosis throughout this area preservation of the distal runoff.    Disposition: Patient was  taken to the recovery room in stable condition having tolerated the procedure well.  Meili Kleckley, Dolores Lory 05/01/2015,11:53 AM

## 2015-05-01 NOTE — Assessment & Plan Note (Signed)
.  Well controlled on current statin therapy.   Liver enzymes are normal , no changes today.  Lab Results  Component Value Date   CHOL 120 06/16/2014   HDL 36.00* 06/16/2014   LDLCALC 58 06/16/2014   LDLDIRECT 71.0 10/04/2014   TRIG 132.0 06/16/2014   CHOLHDL 3 06/16/2014   Lab Results  Component Value Date   ALT 15 04/30/2015   AST 14 04/30/2015   ALKPHOS 82 04/30/2015   BILITOT 0.3 04/30/2015

## 2015-05-01 NOTE — Discharge Instructions (Signed)
Angiogram, Care After °Refer to this sheet in the next few weeks. These instructions provide you with information about caring for yourself after your procedure. Your health care provider may also give you more specific instructions. Your treatment has been planned according to current medical practices, but problems sometimes occur. Call your health care provider if you have any problems or questions after your procedure. °WHAT TO EXPECT AFTER THE PROCEDURE °After your procedure, it is typical to have the following: °· Bruising at the catheter insertion site that usually fades within 1-2 weeks. °· Blood collecting in the tissue (hematoma) that may be painful to the touch. It should usually decrease in size and tenderness within 1-2 weeks. °HOME CARE INSTRUCTIONS °· Take medicines only as directed by your health care provider. °· You may shower 24-48 hours after the procedure or as directed by your health care provider. Remove the bandage (dressing) and gently wash the site with plain soap and water. Pat the area dry with a clean towel. Do not rub the site, because this may cause bleeding. °· Do not take baths, swim, or use a hot tub until your health care provider approves. °· Check your insertion site every day for redness, swelling, or drainage. °· Do not apply powder or lotion to the site. °· Do not lift over 10 lb (4.5 kg) for 5 days after your procedure or as directed by your health care provider. °· Ask your health care provider when it is okay to: °¨ Return to work or school. °¨ Resume usual physical activities or sports. °¨ Resume sexual activity. °· Do not drive home if you are discharged the same day as the procedure. Have someone else drive you. °· You may drive 24 hours after the procedure unless otherwise instructed by your health care provider. °· Do not operate machinery or power tools for 24 hours after the procedure or as directed by your health care provider. °· If your procedure was done as an  outpatient procedure, which means that you went home the same day as your procedure, a responsible adult should be with you for the first 24 hours after you arrive home. °· Keep all follow-up visits as directed by your health care provider. This is important. °SEEK MEDICAL CARE IF: °· You have a fever. °· You have chills. °· You have increased bleeding from the catheter insertion site. Hold pressure on the site. °SEEK IMMEDIATE MEDICAL CARE IF: °· You have unusual pain at the catheter insertion site. °· You have redness, warmth, or swelling at the catheter insertion site. °· You have drainage (other than a small amount of blood on the dressing) from the catheter insertion site. °· The catheter insertion site is bleeding, and the bleeding does not stop after 30 minutes of holding steady pressure on the site. °· The area near or just beyond the catheter insertion site becomes pale, cool, tingly, or numb. °  °This information is not intended to replace advice given to you by your health care provider. Make sure you discuss any questions you have with your health care provider. °  °Document Released: 01/09/2005 Document Revised: 07/14/2014 Document Reviewed: 11/24/2012 °Elsevier Interactive Patient Education ©2016 [employer] ° °

## 2015-05-01 NOTE — Assessment & Plan Note (Signed)
Lisinopril dose increased today to 10 mg daily

## 2015-05-01 NOTE — Op Note (Signed)
[facility] History & Physical Update  The patient was interviewed and re-examined.  The patient's previous History and Physical has been reviewed and is unchanged.  There is no change in the plan of care. We plan to proceed with the scheduled procedure.  Sehaj Kolden, Dolores Lory, MD  05/01/2015, 10:24 AM

## 2015-05-01 NOTE — H&P (Signed)
[facility] History & Physical Update  The patient was interviewed and re-examined.  The patient's previous History and Physical has been reviewed and is unchanged.  There is no change in the plan of care. We plan to proceed with the scheduled procedure.  Alisandra Son, Dolores Lory, MD  05/01/2015, 11:52 AM

## 2015-05-01 NOTE — Anesthesia Procedure Notes (Signed)
Performed by: Demetrius Charity Pre-anesthesia Checklist: Patient identified, Emergency Drugs available, Suction available, Patient being monitored and Timeout performed Patient Re-evaluated:Patient Re-evaluated prior to inductionOxygen Delivery Method: Simple face mask Intubation Type: IV induction Placement Confirmation: positive ETCO2

## 2015-05-01 NOTE — Assessment & Plan Note (Signed)
well-controlled on current medications. With some loss of control due to recent surgeries, now resolved on 36 units Levemir daily    . Patient is up-to-date on eye exams and foot exam is unchanged today. Patient  Has had  urine microalbumin to creatinine ratio done within the past year. Patient is tolerating statin therapy for CAD risk reduction and on ACE/ARB for reduction in proteinuria.   Lab Results  Component Value Date   HGBA1C 6.5 04/30/2015   Lab Results  Component Value Date   MICROALBUR 4.3* 06/16/2014

## 2015-05-01 NOTE — Transfer of Care (Signed)
Immediate Anesthesia Transfer of Care Note  Patient: Laura Reed  Procedure(s) Performed: Procedure(s): Lower Extremity Angiography (Left) Lower Extremity Intervention  Patient Location: PACU  Anesthesia Type:General  Level of Consciousness: awake, alert  and oriented  Airway & Oxygen Therapy: Patient Spontanous Breathing and Patient connected to face mask oxygen  Post-op Assessment: Report given to RN and Post -op Vital signs reviewed and stable  Post vital signs: Reviewed and stable  Last Vitals:  Filed Vitals:   05/01/15 0858  BP: 136/56  Temp: 36.6 C  Resp: 20    Complications: No apparent anesthesia complications

## 2015-05-02 ENCOUNTER — Encounter: Payer: Self-pay | Admitting: Internal Medicine

## 2015-05-02 ENCOUNTER — Other Ambulatory Visit: Payer: Self-pay | Admitting: Internal Medicine

## 2015-05-02 DIAGNOSIS — R944 Abnormal results of kidney function studies: Secondary | ICD-10-CM

## 2015-05-03 ENCOUNTER — Encounter: Payer: Self-pay | Admitting: Vascular Surgery

## 2015-05-07 NOTE — Anesthesia Postprocedure Evaluation (Signed)
  Anesthesia Post-op Note  Patient: Laura Reed  Procedure(s) Performed: Procedure(s): Lower Extremity Angiography (Left) Lower Extremity Intervention  Anesthesia type:General  Patient location: PACU  Post pain: Pain level controlled  Post assessment: Post-op Vital signs reviewed, Patient's Cardiovascular Status Stable, Respiratory Function Stable, Patent Airway and No signs of Nausea or vomiting  Post vital signs: Reviewed and stable  Last Vitals:  Filed Vitals:   05/01/15 1330  BP: 148/68  Pulse:   Temp:   Resp: 16    Level of consciousness: awake, alert  and patient cooperative  Complications: No apparent anesthesia complications

## 2015-05-15 ENCOUNTER — Encounter: Payer: Self-pay | Admitting: Urology

## 2015-05-15 ENCOUNTER — Ambulatory Visit (INDEPENDENT_AMBULATORY_CARE_PROVIDER_SITE_OTHER): Payer: Medicare Other | Admitting: Urology

## 2015-05-15 ENCOUNTER — Telehealth: Payer: Self-pay | Admitting: Internal Medicine

## 2015-05-15 VITALS — BP 188/68 | HR 79 | Ht 66.0 in | Wt 177.1 lb

## 2015-05-15 DIAGNOSIS — R5383 Other fatigue: Secondary | ICD-10-CM

## 2015-05-15 DIAGNOSIS — E1151 Type 2 diabetes mellitus with diabetic peripheral angiopathy without gangrene: Secondary | ICD-10-CM

## 2015-05-15 DIAGNOSIS — I739 Peripheral vascular disease, unspecified: Secondary | ICD-10-CM

## 2015-05-15 DIAGNOSIS — Q600 Renal agenesis, unilateral: Secondary | ICD-10-CM

## 2015-05-15 DIAGNOSIS — E1142 Type 2 diabetes mellitus with diabetic polyneuropathy: Secondary | ICD-10-CM

## 2015-05-15 DIAGNOSIS — C642 Malignant neoplasm of left kidney, except renal pelvis: Secondary | ICD-10-CM

## 2015-05-15 DIAGNOSIS — L03119 Cellulitis of unspecified part of limb: Secondary | ICD-10-CM

## 2015-05-15 DIAGNOSIS — L02419 Cutaneous abscess of limb, unspecified: Secondary | ICD-10-CM | POA: Insufficient documentation

## 2015-05-15 DIAGNOSIS — IMO0002 Reserved for concepts with insufficient information to code with codable children: Secondary | ICD-10-CM

## 2015-05-15 DIAGNOSIS — Z113 Encounter for screening for infections with a predominantly sexual mode of transmission: Secondary | ICD-10-CM

## 2015-05-15 DIAGNOSIS — E1165 Type 2 diabetes mellitus with hyperglycemia: Principal | ICD-10-CM

## 2015-05-15 DIAGNOSIS — Z905 Acquired absence of kidney: Secondary | ICD-10-CM | POA: Insufficient documentation

## 2015-05-15 DIAGNOSIS — N2889 Other specified disorders of kidney and ureter: Secondary | ICD-10-CM

## 2015-05-15 LAB — URINALYSIS, COMPLETE
Bilirubin, UA: NEGATIVE
Ketones, UA: NEGATIVE
Nitrite, UA: NEGATIVE
PROTEIN UA: NEGATIVE
Specific Gravity, UA: 1.01 (ref 1.005–1.030)
Urobilinogen, Ur: 0.2 mg/dL (ref 0.2–1.0)
pH, UA: 6 (ref 5.0–7.5)

## 2015-05-15 LAB — MICROSCOPIC EXAMINATION
Bacteria, UA: NONE SEEN
WBC, UA: >30 /hpf — AB (ref 0–?)

## 2015-05-15 MED ORDER — LIDOCAINE HCL 2 % EX GEL
1.0000 "application " | Freq: Once | CUTANEOUS | Status: AC
  Administered 2015-05-15: 1 via URETHRAL

## 2015-05-15 MED ORDER — CIPROFLOXACIN HCL 500 MG PO TABS
500.0000 mg | ORAL_TABLET | Freq: Once | ORAL | Status: AC
  Administered 2015-05-15: 500 mg via ORAL

## 2015-05-15 NOTE — Telephone Encounter (Signed)
Yes, she just hasn't had an actual CPE  since 2013. Will do with next lab draw due for diabetes on or after Jan 25th,2017.   please  Schedule CPE too

## 2015-05-15 NOTE — Telephone Encounter (Signed)
Pt wants to know does she need to get the Hep C test done? Thank you!

## 2015-05-15 NOTE — Telephone Encounter (Signed)
Please advise if screening needed.

## 2015-05-15 NOTE — Progress Notes (Signed)
9:20 AM   Laura Reed [birth year] [id number]  Referring provider: [name], MD [address] [place], [place] [address]  Chief Complaint  Patient presents with  . Cysto    HPI:  1 - Left Upper Tract Transitional Cel Carcinoma - s/p LEFT robotic nephroureterectomy on 10/31/2014 as well as combined ventral hernia repair with Dr. Bary Castilla. Post complicated by wound infection now well healed. Pathology  [id number] with negative margins.  Recent Surveillance: 02/2015 - cysto NED; 05/2015 cysto NED  2 - Solitary Kidney  - s/p left nephrecotmy as per above. Most recent Cr 1.23 04/2015.   She does have an extensive smoking history, quit 3 years ago but smoked up to 2 packs a day for 35 years. She also has multiple medical comorbidities including history of diabetes, CAD status post CABG, carotid endarterectomy.    She presents today for routine cystoscopy.  Had what sounds like iliac arterial stent in interval that was ucomplicated. She is happy that most recent A1c <7. No interval hematuira.    PMH: Past Medical History  Diagnosis Date  . Diabetes mellitus   . Hypertension   . Hemorrhoid   . PVD (peripheral vascular disease) ([facility] [place])   . Heart murmur   . CHF (congestive heart failure) ([place])   . Coronary artery disease   . Cancer ([place])   . Urothelial carcinoma of kidney ([place]) 10/31/2014    INVASIVE UROTHELIAL CARCINOMA, LOW GRADE. T1, Nx.    Surgical History: Past Surgical History  Procedure Laterality Date  . Coronary artery bypass graft  2009    3 vessel  . Arterial bypass surgry  2009, 2013 x 2    right leg , done in [place]  . Cesarean section    . Carotid endarterectomy  July 2015    Dr Delana Meyer  . Cholecystectomy  03-03-12    Porcelain gallbladder, gallstones,  [place]  . Colonoscopy w/ biopsies  04/28/2012    Hyperplastic rectal polyps.  . Nephrectomy Left 10-31-14  . Hernia repair  10-31-14    ventral, retro-rectus atrium mesh  . Amputation  toe    . Cataract extraction w/phaco Right 12/14/2014    Procedure: CATARACT EXTRACTION PHACO AND INTRAOCULAR LENS PLACEMENT (IOC);  Surgeon: Lyla Glassing, MD;  Location: [facility];  Service: Ophthalmology;  Laterality: Right;  [place]   00:38.6              AP        7.1                   CDE  2.76  . Peripheral vascular catheterization Left 02/20/2015    Procedure: Pelvic Angiography;  Surgeon: Katha Cabal, MD;  Location: [facility];  Service: Cardiovascular;  Laterality: Left;  . Peripheral vascular catheterization Left 05/01/2015    Procedure: Lower Extremity Angiography;  Surgeon: Katha Cabal, MD;  Location: [facility];  Service: Cardiovascular;  Laterality: Left;  . Peripheral vascular catheterization  05/01/2015    Procedure: Lower Extremity Intervention;  Surgeon: Katha Cabal, MD;  Location: [facility];  Service: Cardiovascular;;    Home Medications:    Medication List       This list is accurate as of: 05/15/15  9:20 AM.  Always use your most recent med list.               ACCU-CHEK AVIVA PLUS test strip  Generic drug:  glucose blood  CHECK  BLOOD  SUGAR    TWO TO  THREE  TIMES  A  DAY     ALPRAZolam 0.5 MG tablet  Commonly known as:  XANAX  Take 1 tablet (0.5 mg total) by mouth 3 (three) times daily as needed.     aspirin 81 MG chewable tablet  Chew 81 mg by mouth daily.     atorvastatin 20 MG tablet  Commonly known as:  LIPITOR  TAKE 1 TABLET BY MOUTH EVERY DAY     B-D ULTRAFINE III SHORT PEN 31G X 8 MM Misc  Generic drug:  Insulin Pen Needle  TEST BLOOD SUGAR TWICE DAILY     Biotin 1000 MCG tablet  Take 1,000 mcg by mouth daily.     clopidogrel 75 MG tablet  Commonly known as:  PLAVIX  TAKE 1 TABLET BY MOUTH EVERY DAY     DIGOX 0.125 MG tablet  Generic drug:  digoxin  TAKE 1 TABLET BY MOUTH EVERY DAY     gabapentin 600 MG tablet  Commonly known as:  NEURONTIN  TAKE 1 TABLET BY MOUTH THREE TIMES DAILY      Insulin Detemir 100 UNIT/ML Pen  Commonly known as:  LEVEMIR FLEXTOUCH  Inject 40 Units into the skin daily at 10 pm.     Insulin Lispro Prot & Lispro (75-25) 100 UNIT/ML Kwikpen  Commonly known as:  HUMALOG 75/25 MIX  30 units in the am 36 units in the pm     lisinopril 5 MG tablet  Commonly known as:  PRINIVIL,ZESTRIL  Take 1 tablet (5 mg total) by mouth daily.     predniSONE 10 MG tablet  Commonly known as:  DELTASONE  6 tablets on Day 1 , then reduce by 1 tablet daily until gone     vitamin C 500 MG tablet  Commonly known as:  ASCORBIC ACID  Take 1,000 mg by mouth 2 (two) times daily.     Vitamin D3 2000 UNITS Tabs  Take 1 tablet by mouth daily.        Allergies: No Known Allergies  Family History: Family History  Problem Relation Age of Onset  . Cancer Mother 67    Lung Cancer  . Cancer Father 70    Lung Ca    Social History:  reports that she quit smoking about 4 years ago. Her smoking use included Cigarettes. She has never used smokeless tobacco. She reports that she does not drink alcohol or use illicit drugs.  Physical Exam: There were no vitals taken for this visit.  Constitutional:  Alert and oriented, No acute distress. HEENT: [place] AT, moist mucus membranes.  Trachea midline, no masses. Cardiovascular: No clubbing, cyanosis, or edema. Respiratory: Normal respiratory effort, no increased work of breathing. GI: Abdomen is soft, nontender, nondistended, no abdominal masses.  Wounds well healed, no abdominal hernia.   GU: No CVA tenderness. Normal external genitalia.   Skin: No rashes, bruises or suspicious lesions. Neurologic: Grossly intact, no focal deficits, moving all 4 extremities. Psychiatric: Normal mood and affect.  Laboratory Data: Lab Results  Component Value Date   WBC 12.8* 11/02/2014   HGB 9.5* 11/02/2014   HCT 29.5* 11/02/2014   MCV 87 11/02/2014   PLT 217 11/02/2014    Lab Results  Component Value Date   CREATININE 1.23* 04/30/2015      Lab Results  Component Value Date   HGBA1C 6.5 04/30/2015     Cystoscopy Procedure Note  Patient identification was confirmed, informed  consent was obtained, and patient was prepped using Betadine solution.  Lidocaine jelly was administered per urethral meatus.    Preoperative abx where received prior to procedure.    Procedure: - Flexible cystoscope introduced, without any difficulty.   - Thorough search of the bladder revealed:    normal urethral meatus    normal urothelium    no stones    no ulcers     no tumors    no urethral polyps    no trabeculation  - Right UO normal in position and appearance, Left UO not clearly visualized (surgically absent).  Post-Procedure: - Patient tolerated the procedure well. Fonnie Jarvis chaparone today.   Assessment & Plan:    1 - Left Upper Tract Transitional Cel Carcinoma - NED by cysto today. Continue Q81mo cysto x 1 year, then less frequent, consider CT annually.   2 - Solitary Kidney  - Excellent overall GFR by recent serum labs, observe.  3 - RTC 3 mo for cysto with Dr. Erlene Quan.   No Follow-up on file.  Alexis Frock, [facility] [address] [place], [place] [address] [phone]

## 2015-05-16 NOTE — Telephone Encounter (Signed)
Pt is scheduled °

## 2015-05-16 NOTE — Telephone Encounter (Signed)
Needs a CPE scheduled and labs after January 25th, 2017.  The labs will include the hepatitis C screening.

## 2015-05-17 ENCOUNTER — Other Ambulatory Visit: Payer: Self-pay | Admitting: Internal Medicine

## 2015-05-18 ENCOUNTER — Other Ambulatory Visit (INDEPENDENT_AMBULATORY_CARE_PROVIDER_SITE_OTHER): Payer: Medicare Other

## 2015-05-18 DIAGNOSIS — E1151 Type 2 diabetes mellitus with diabetic peripheral angiopathy without gangrene: Secondary | ICD-10-CM | POA: Diagnosis not present

## 2015-05-18 DIAGNOSIS — Z113 Encounter for screening for infections with a predominantly sexual mode of transmission: Secondary | ICD-10-CM

## 2015-05-18 DIAGNOSIS — R5383 Other fatigue: Secondary | ICD-10-CM | POA: Diagnosis not present

## 2015-05-18 DIAGNOSIS — IMO0002 Reserved for concepts with insufficient information to code with codable children: Secondary | ICD-10-CM

## 2015-05-18 DIAGNOSIS — R944 Abnormal results of kidney function studies: Secondary | ICD-10-CM | POA: Diagnosis not present

## 2015-05-18 DIAGNOSIS — E1165 Type 2 diabetes mellitus with hyperglycemia: Secondary | ICD-10-CM

## 2015-05-18 LAB — COMPREHENSIVE METABOLIC PANEL
ALBUMIN: 3.9 g/dL (ref 3.5–5.2)
ALK PHOS: 84 U/L (ref 39–117)
ALT: 13 U/L (ref 0–35)
AST: 12 U/L (ref 0–37)
BUN: 27 mg/dL — AB (ref 6–23)
CALCIUM: 9.2 mg/dL (ref 8.4–10.5)
CHLORIDE: 103 meq/L (ref 96–112)
CO2: 28 mEq/L (ref 19–32)
Creatinine, Ser: 1.03 mg/dL (ref 0.40–1.20)
GFR: 58.77 mL/min — AB (ref >60.00)
Glucose, Bld: 235 mg/dL — ABNORMAL HIGH (ref 70–99)
POTASSIUM: 5.2 meq/L — AB (ref 3.5–5.1)
Sodium: 137 mEq/L (ref 135–145)
TOTAL PROTEIN: 6.4 g/dL (ref 6.0–8.3)
Total Bilirubin: 0.3 mg/dL (ref 0.2–1.2)

## 2015-05-18 LAB — RENAL FUNCTION PANEL
Albumin: 3.9 g/dL (ref 3.5–5.2)
BUN: 27 mg/dL — ABNORMAL HIGH (ref 6–23)
CALCIUM: 9.2 mg/dL (ref 8.4–10.5)
CHLORIDE: 103 meq/L (ref 96–112)
CO2: 28 mEq/L (ref 19–32)
Creatinine, Ser: 1.03 mg/dL (ref 0.40–1.20)
GFR: 58.77 mL/min — AB (ref >60.00)
GLUCOSE: 235 mg/dL — AB (ref 70–99)
POTASSIUM: 5.2 meq/L — AB (ref 3.5–5.1)
Phosphorus: 3.6 mg/dL (ref 2.3–4.6)
Sodium: 137 mEq/L (ref 135–145)

## 2015-05-18 LAB — CBC WITH DIFFERENTIAL/PLATELET
Basophils Absolute: 0 10*3/uL (ref 0.0–0.1)
Basophils Relative: 0.3 % (ref 0.0–3.0)
Eosinophils Absolute: 0.2 10*3/uL (ref 0.0–0.7)
Eosinophils Relative: 1.2 % (ref 0.0–5.0)
HCT: 38.5 % (ref 36.0–46.0)
HEMOGLOBIN: 12.7 g/dL (ref 12.0–15.0)
LYMPHS ABS: 1.6 10*3/uL (ref 0.7–4.0)
LYMPHS PCT: 11.7 % — AB (ref 12.0–46.0)
MCHC: 33 g/dL (ref 30.0–36.0)
MCV: 86.6 fl (ref 78.0–100.0)
MONOS PCT: 6.5 % (ref 3.0–12.0)
Monocytes Absolute: 0.9 10*3/uL (ref 0.1–1.0)
NEUTROS PCT: 80.3 % — AB (ref 43.0–77.0)
Neutro Abs: 10.8 10*3/uL — ABNORMAL HIGH (ref 1.4–7.7)
Platelets: 282 10*3/uL (ref 150.0–400.0)
RBC: 4.44 Mil/uL (ref 3.87–5.11)
RDW: 14.2 % (ref 11.5–15.5)
WBC: 13.4 10*3/uL — AB (ref 4.0–10.5)

## 2015-05-18 LAB — LDL CHOLESTEROL, DIRECT: Direct LDL: 61 mg/dL

## 2015-05-18 LAB — LIPID PANEL
Cholesterol: 127 mg/dL (ref 0–200)
HDL: 44.3 mg/dL (ref >39.00)
LDL Cholesterol: 54 mg/dL (ref 0–99)
NONHDL: 82.6
Total CHOL/HDL Ratio: 3
Triglycerides: 141 mg/dL (ref 0.0–149.0)
VLDL: 28.2 mg/dL (ref 0.0–40.0)

## 2015-05-18 LAB — MICROALBUMIN / CREATININE URINE RATIO
Creatinine,U: 41.1 mg/dL
MICROALB UR: 5.4 mg/dL — AB (ref 0.0–1.9)
Microalb Creat Ratio: 13.1 mg/g (ref 0.0–30.0)

## 2015-05-18 LAB — TSH: TSH: 0.96 u[IU]/mL (ref 0.35–4.50)

## 2015-05-18 LAB — HEMOGLOBIN A1C: Hgb A1c MFr Bld: 7.3 % — ABNORMAL HIGH (ref 4.6–6.5)

## 2015-05-19 LAB — HIV ANTIBODY (ROUTINE TESTING W REFLEX): HIV: NONREACTIVE

## 2015-05-19 LAB — HEPATITIS C ANTIBODY: HCV Ab: NEGATIVE

## 2015-05-22 ENCOUNTER — Other Ambulatory Visit: Payer: Self-pay | Admitting: Internal Medicine

## 2015-05-22 ENCOUNTER — Telehealth: Payer: Self-pay | Admitting: Internal Medicine

## 2015-05-22 DIAGNOSIS — E875 Hyperkalemia: Secondary | ICD-10-CM

## 2015-05-22 NOTE — Telephone Encounter (Signed)
Her kidney function is fine post procedure,  but her potassium is slightly elevated.  Can she return for a BMET?  nonfasting ok

## 2015-05-22 NOTE — Telephone Encounter (Signed)
Laura Reed,  This patient was not supposed to  Have The majority of the labs that were just resulted  until late January (A1c, etc)  ,  But she recently She came in for teh "renal function panel" and ALL OF THE FUTURE LABS  got done.  She is probably going to get charged out of pocket for some of them,  Including the A1c since it was just done 3 weeks ago.  I don't know how this happened, bc I am very careful about putting the A1c in as a future with a projected date.  Can you explain?  Dr. Derrel Nip

## 2015-05-22 NOTE — Telephone Encounter (Signed)
Spoke with patient regarding labs and scheduled BMET recheck for tomorrow.  Patient was concerned about mammogram not being scheduled yet, called [facility] and scheduled for 11/22 at 9am.  Returned call to patient to confirm appointment.

## 2015-05-23 ENCOUNTER — Other Ambulatory Visit (INDEPENDENT_AMBULATORY_CARE_PROVIDER_SITE_OTHER): Payer: Medicare Other

## 2015-05-23 ENCOUNTER — Telehealth: Payer: Self-pay | Admitting: Internal Medicine

## 2015-05-23 ENCOUNTER — Other Ambulatory Visit: Payer: Self-pay | Admitting: Internal Medicine

## 2015-05-23 DIAGNOSIS — E875 Hyperkalemia: Secondary | ICD-10-CM | POA: Diagnosis not present

## 2015-05-23 LAB — BASIC METABOLIC PANEL
BUN: 31 mg/dL — AB (ref 6–23)
CALCIUM: 9.7 mg/dL (ref 8.4–10.5)
CO2: 27 mEq/L (ref 19–32)
Chloride: 104 mEq/L (ref 96–112)
Creatinine, Ser: 1.04 mg/dL (ref 0.40–1.20)
GFR: 58.12 mL/min — AB (ref >60.00)
GLUCOSE: 230 mg/dL — AB (ref 70–99)
POTASSIUM: 4.9 meq/L (ref 3.5–5.1)
Sodium: 137 mEq/L (ref 135–145)

## 2015-05-23 MED ORDER — PREDNISONE 10 MG PO TABS: ORAL_TABLET | ORAL | Status: DC

## 2015-05-23 NOTE — Telephone Encounter (Signed)
Please advise 

## 2015-05-23 NOTE — Telephone Encounter (Signed)
Caller name: Yicel Sherlin  Relationship to patient: patient Can be reached: [phone]  Reason for call: pt stated that she is not getting better and requested a rx for prednisone

## 2015-05-23 NOTE — Telephone Encounter (Signed)
prednisone taper sent to pharmacy

## 2015-05-24 ENCOUNTER — Other Ambulatory Visit: Payer: Medicare Other

## 2015-05-24 ENCOUNTER — Encounter: Payer: Self-pay | Admitting: Internal Medicine

## 2015-05-24 MED ORDER — BENZONATATE 100 MG PO CAPS: 200.0000 mg | ORAL_CAPSULE | Freq: Three times a day (TID) | ORAL | Status: DC | PRN

## 2015-05-24 NOTE — Telephone Encounter (Signed)
Spoke with patient, she said thanks, wanted to see if she can get something for her cough?  I taught maybe Tesslon pearles?  Please advise?

## 2015-05-24 NOTE — Telephone Encounter (Signed)
done

## 2015-05-29 ENCOUNTER — Other Ambulatory Visit: Payer: Self-pay | Admitting: Internal Medicine

## 2015-05-29 ENCOUNTER — Other Ambulatory Visit: Payer: Self-pay

## 2015-05-29 ENCOUNTER — Ambulatory Visit
Admission: RE | Admit: 2015-05-29 | Discharge: 2015-05-29 | Disposition: A | Discharge status: Home / Self Care | Payer: Medicare Other | Source: Ambulatory Visit | Attending: Internal Medicine | Admitting: Internal Medicine

## 2015-05-29 DIAGNOSIS — Z1239 Encounter for other screening for malignant neoplasm of breast: Secondary | ICD-10-CM

## 2015-05-29 DIAGNOSIS — Z1231 Encounter for screening mammogram for malignant neoplasm of breast: Secondary | ICD-10-CM | POA: Insufficient documentation

## 2015-05-29 MED ORDER — PREDNISONE 10 MG PO TABS: ORAL_TABLET | ORAL | Status: DC

## 2015-05-29 MED ORDER — LISINOPRIL 5 MG PO TABS: 10.0000 mg | ORAL_TABLET | Freq: Every day | ORAL | Status: DC

## 2015-06-03 ENCOUNTER — Encounter: Payer: Self-pay | Admitting: Internal Medicine

## 2015-06-04 ENCOUNTER — Telehealth: Payer: Self-pay

## 2015-06-04 DIAGNOSIS — L851 Acquired keratosis [keratoderma] palmaris et plantaris: Secondary | ICD-10-CM | POA: Diagnosis not present

## 2015-06-04 DIAGNOSIS — E1140 Type 2 diabetes mellitus with diabetic neuropathy, unspecified: Secondary | ICD-10-CM | POA: Diagnosis not present

## 2015-06-04 DIAGNOSIS — B351 Tinea unguium: Secondary | ICD-10-CM | POA: Diagnosis not present

## 2015-06-04 DIAGNOSIS — Z794 Long term (current) use of insulin: Secondary | ICD-10-CM | POA: Diagnosis not present

## 2015-06-04 NOTE — Telephone Encounter (Signed)
Patient's pharmacy requested refills on 11/22, for Lisinopril and Prednisone.  Both were refilled, patient called the office at some point at end of last week questioning the prednisone.  i do not see any notes from you to repeat the prednisone course.  Please advise and I will call patient back.

## 2015-06-04 NOTE — Telephone Encounter (Signed)
The prednisone taper was sent on Nov 22!  It's in the char t.  Please call it in if sh e never got it

## 2015-06-05 NOTE — Telephone Encounter (Signed)
Attempted to call patient yesterday, patient should not take this course as she was only to take one round of prednisone

## 2015-06-08 ENCOUNTER — Other Ambulatory Visit: Payer: Self-pay | Admitting: Internal Medicine

## 2015-06-11 DIAGNOSIS — M1990 Unspecified osteoarthritis, unspecified site: Secondary | ICD-10-CM | POA: Diagnosis not present

## 2015-06-11 DIAGNOSIS — I70213 Atherosclerosis of native arteries of extremities with intermittent claudication, bilateral legs: Secondary | ICD-10-CM | POA: Diagnosis not present

## 2015-06-11 DIAGNOSIS — E119 Type 2 diabetes mellitus without complications: Secondary | ICD-10-CM | POA: Diagnosis not present

## 2015-06-11 DIAGNOSIS — N183 Chronic kidney disease, stage 3 (moderate): Secondary | ICD-10-CM | POA: Diagnosis not present

## 2015-07-05 ENCOUNTER — Encounter: Payer: Medicare Other | Admitting: Internal Medicine

## 2015-07-31 ENCOUNTER — Encounter: Payer: Medicare Other | Admitting: Internal Medicine

## 2015-07-31 ENCOUNTER — Encounter: Payer: Self-pay | Admitting: Internal Medicine

## 2015-07-31 ENCOUNTER — Other Ambulatory Visit (HOSPITAL_COMMUNITY)
Admission: RE | Admit: 2015-07-31 | Discharge: 2015-07-31 | Disposition: A | Discharge status: Home / Self Care | Payer: Medicare Other | Source: Ambulatory Visit | Attending: Internal Medicine | Admitting: Internal Medicine

## 2015-07-31 ENCOUNTER — Ambulatory Visit (INDEPENDENT_AMBULATORY_CARE_PROVIDER_SITE_OTHER): Payer: Medicare Other | Admitting: Internal Medicine

## 2015-07-31 ENCOUNTER — Ambulatory Visit: Payer: Medicare Other | Admitting: Internal Medicine

## 2015-07-31 VITALS — BP 122/62 | HR 69 | Temp 98.0°F | Resp 12 | Ht 66.0 in | Wt 175.2 lb

## 2015-07-31 DIAGNOSIS — C649 Malignant neoplasm of unspecified kidney, except renal pelvis: Secondary | ICD-10-CM | POA: Diagnosis not present

## 2015-07-31 DIAGNOSIS — Z124 Encounter for screening for malignant neoplasm of cervix: Secondary | ICD-10-CM

## 2015-07-31 DIAGNOSIS — E875 Hyperkalemia: Secondary | ICD-10-CM

## 2015-07-31 DIAGNOSIS — E1165 Type 2 diabetes mellitus with hyperglycemia: Secondary | ICD-10-CM

## 2015-07-31 DIAGNOSIS — E1169 Type 2 diabetes mellitus with other specified complication: Secondary | ICD-10-CM

## 2015-07-31 DIAGNOSIS — Z794 Long term (current) use of insulin: Secondary | ICD-10-CM | POA: Diagnosis not present

## 2015-07-31 DIAGNOSIS — IMO0002 Reserved for concepts with insufficient information to code with codable children: Secondary | ICD-10-CM

## 2015-07-31 DIAGNOSIS — E1151 Type 2 diabetes mellitus with diabetic peripheral angiopathy without gangrene: Secondary | ICD-10-CM | POA: Diagnosis not present

## 2015-07-31 DIAGNOSIS — E785 Hyperlipidemia, unspecified: Secondary | ICD-10-CM

## 2015-07-31 DIAGNOSIS — Z1151 Encounter for screening for human papillomavirus (HPV): Secondary | ICD-10-CM | POA: Diagnosis not present

## 2015-07-31 DIAGNOSIS — E1100 Type 2 diabetes mellitus with hyperosmolarity without nonketotic hyperglycemic-hyperosmolar coma (NKHHC): Secondary | ICD-10-CM | POA: Diagnosis not present

## 2015-07-31 DIAGNOSIS — Z01419 Encounter for gynecological examination (general) (routine) without abnormal findings: Secondary | ICD-10-CM | POA: Diagnosis not present

## 2015-07-31 DIAGNOSIS — L97521 Non-pressure chronic ulcer of other part of left foot limited to breakdown of skin: Secondary | ICD-10-CM | POA: Diagnosis not present

## 2015-07-31 DIAGNOSIS — E1142 Type 2 diabetes mellitus with diabetic polyneuropathy: Secondary | ICD-10-CM

## 2015-07-31 DIAGNOSIS — Z0000 Encounter for general adult medical examination without abnormal findings: Secondary | ICD-10-CM

## 2015-07-31 LAB — COMPREHENSIVE METABOLIC PANEL
ALT: 15 U/L (ref 0–35)
AST: 14 U/L (ref 0–37)
Albumin: 3.9 g/dL (ref 3.5–5.2)
Alkaline Phosphatase: 81 U/L (ref 39–117)
BUN: 38 mg/dL — ABNORMAL HIGH (ref 6–23)
CALCIUM: 9 mg/dL (ref 8.4–10.5)
CHLORIDE: 104 meq/L (ref 96–112)
CO2: 26 meq/L (ref 19–32)
Creatinine, Ser: 1.02 mg/dL (ref 0.40–1.20)
GFR: 59.39 mL/min — AB (ref >60.00)
Glucose, Bld: 57 mg/dL — ABNORMAL LOW (ref 70–99)
POTASSIUM: 4.6 meq/L (ref 3.5–5.1)
Sodium: 138 mEq/L (ref 135–145)
Total Bilirubin: 0.3 mg/dL (ref 0.2–1.2)
Total Protein: 6.6 g/dL (ref 6.0–8.3)

## 2015-07-31 LAB — BASIC METABOLIC PANEL
BUN: 39 mg/dL — AB (ref 6–23)
CALCIUM: 9 mg/dL (ref 8.4–10.5)
CHLORIDE: 104 meq/L (ref 96–112)
CO2: 26 meq/L (ref 19–32)
CREATININE: 1.03 mg/dL (ref 0.40–1.20)
GFR: 58.73 mL/min — AB (ref >60.00)
GLUCOSE: 59 mg/dL — AB (ref 70–99)
Potassium: 4.6 mEq/L (ref 3.5–5.1)
Sodium: 138 mEq/L (ref 135–145)

## 2015-07-31 LAB — LDL CHOLESTEROL, DIRECT: Direct LDL: 58 mg/dL

## 2015-07-31 LAB — HEMOGLOBIN A1C: HEMOGLOBIN A1C: 9.5 % — AB (ref 4.6–6.5)

## 2015-07-31 NOTE — Progress Notes (Signed)
Patient ID: Laura Reed, female    DOB: 18-Jan-1961  Age: 55 y.o. MRN: [id number]  The patient is here for annual  wellnes examination and management of other chronic and acute problems.   last mammogram Nov 2016 Last PAP 2013 Colonoscopy 2013, [place] Cystoscopy every 3 months, there has been no recurrence of bladder CA next one in Feb  The risk factors are reflected in the social history.  The roster of all physicians providing medical care to patient - is listed in the Snapshot section of the chart.  Activities of daily living:  The patient is 100% independent in all ADLs: dressing, toileting, feeding as well as independent mobility  Home safety : The patient has smoke detectors in the home. They wear seatbelts.  There are no firearms at home. There is no violence in the home.   There is no risks for hepatitis, STDs or HIV. There is no   history of blood transfusion. They have no travel history to infectious disease endemic areas of the world.  The patient has seen their dentist in the last six month. They have seen their eye doctor in the last year. They admit to slight hearing difficulty with regard to whispered voices and some television programs.  They have deferred audiologic testing in the last year.  They do not  have excessive sun exposure. Discussed the need for sun protection: hats, long sleeves and use of sunscreen if there is significant sun exposure.   Diet: the importance of a healthy diet is discussed. They do have a healthy diet.  The benefits of regular aerobic exercise were discussed. She walks 4 times per week ,  20 minutes.   Depression screen: there are no signs or vegative symptoms of depression- irritability, change in appetite, anhedonia, sadness/tearfullness.  Cognitive assessment: the patient manages all their financial and personal affairs and is actively engaged. They could relate day,date,year and events; recalled 2/3 objects at 3 minutes; performed  clock-face test normally.  The following portions of the patient's history were reviewed and updated as appropriate: allergies, current medications, past family history, past medical history,  past surgical history, past social history  and problem list.  Visual acuity was not assessed per patient preference since she has regular follow up with her ophthalmologist. Hearing and body mass index were assessed and reviewed.   During the course of the visit the patient was educated and counseled about appropriate screening and preventive services including : fall prevention , diabetes screening, nutrition counseling, colorectal cancer screening, and recommended immunizations.    CC: The primary encounter diagnosis was Type 2 diabetes mellitus with hyperosmolarity without coma, with long-term current use of insulin ([place]). Diagnoses of Hyperkalemia, Screening for cervical cancer, Uncontrolled type 2 diabetes mellitus with peripheral circulatory disorder ([place]), Renal cancer, unspecified laterality ([place]), Hyperlipidemia associated with type 2 diabetes mellitus ([place]), Diabetic peripheral neuropathy associated with type 2 diabetes mellitus ([place]), and Encounter for preventive health examination were also pertinent to this visit.  She has noted an increase in her blood sugars over the holidays due to dietary indulgences and lack of exercise.  Has not checked any in a week,  No lows.  Taking her insulin as directed.   History Laura Reed has a past medical history of Diabetes mellitus; Hypertension; Hemorrhoid; PVD (peripheral vascular disease) ([place]); Heart murmur; CHF (congestive heart failure) ([place]); Coronary artery disease; Cancer ([place]); and Urothelial carcinoma of kidney ([place]) (10/31/2014).   She has past surgical history that includes Coronary artery  bypass graft (2009); Arterial bypass surgry (2009, 2013 x 2); Cesarean section; Carotid endarterectomy (July 2015); Cholecystectomy (03-03-12); Colonoscopy w/ biopsies  (04/28/2012); Nephrectomy (Left, 10-31-14); Hernia repair (10-31-14); Amputation toe; Cataract extraction w/PHACO (Right, 12/14/2014); Cardiac catheterization (Left, 05/01/2015); Cardiac catheterization (05/01/2015); and Cardiac catheterization (Left, 02/20/2015).   Her family history includes Cancer (age of onset: 55) in her mother; Cancer (age of onset: 63) in her father. There is no history of Breast cancer.She reports that she quit smoking about 4 years ago. Her smoking use included Cigarettes. She has never used smokeless tobacco. She reports that she does not drink alcohol or use illicit drugs.  Outpatient Prescriptions Prior to Visit  Medication Sig Dispense Refill  . ACCU-CHEK AVIVA PLUS test strip CHECK  BLOOD  SUGAR    TWO TO  THREE  TIMES  A  DAY  6  . ALPRAZolam (XANAX) 0.5 MG tablet Take 1 tablet (0.5 mg total) by mouth 3 (three) times daily as needed. 90 tablet 1  . aspirin 81 MG chewable tablet Chew 81 mg by mouth daily.    Marland Kitchen atorvastatin (LIPITOR) 20 MG tablet TAKE 1 TABLET BY MOUTH EVERY DAY 90 tablet 1  . B-D ULTRAFINE III SHORT PEN 31G X 8 MM MISC TEST BLOOD SUGAR TWICE DAILY 100 each 5  . benzonatate (TESSALON) 100 MG capsule Take 2 capsules (200 mg total) by mouth 3 (three) times daily as needed for cough. 60 capsule 0  . Biotin 1000 MCG tablet Take 1,000 mcg by mouth daily.     . Cholecalciferol (VITAMIN D3) 2000 UNITS TABS Take 1 tablet by mouth daily.     . clopidogrel (PLAVIX) 75 MG tablet TAKE 1 TABLET BY MOUTH EVERY DAY 90 tablet 1  . digoxin (LANOXIN) 0.125 MG tablet TAKE 1 TABLET BY MOUTH EVERY DAY 90 tablet 0  . gabapentin (NEURONTIN) 600 MG tablet TAKE 1 TABLET BY MOUTH THREE TIMES DAILY 270 tablet 1  . Insulin Detemir (LEVEMIR FLEXTOUCH) 100 UNIT/ML Pen Inject 40 Units into the skin daily at 10 pm. 15 mL 5  . Insulin Lispro Prot & Lispro (HUMALOG 75/25 MIX) (75-25) 100 UNIT/ML Kwikpen 30 units in the am 36 units in the pm (Patient taking differently: Inject 30-36 Units  into the skin 2 (two) times daily. Pt uses 30 units in the morning and 36 units in the evening.) 30 mL 11  . lisinopril (PRINIVIL,ZESTRIL) 5 MG tablet Take 2 tablets (10 mg total) by mouth daily. 90 tablet 3  . predniSONE (DELTASONE) 10 MG tablet 6 tablets on Day 1 , then reduce by 1 tablet daily until gone 21 tablet 0  . vitamin C (ASCORBIC ACID) 500 MG tablet Take 1,000 mg by mouth 2 (two) times daily.     No facility-administered medications prior to visit.    Review of Systems   Patient denies headache, fevers, malaise, unintentional weight loss, skin rash, eye pain, sinus congestion and sinus pain, sore throat, dysphagia,  hemoptysis , cough, dyspnea, wheezing, chest pain, palpitations, orthopnea, edema, abdominal pain, nausea, melena, diarrhea, constipation, flank pain, dysuria, hematuria, urinary  Frequency, nocturia, numbness, tingling, seizures,  Focal weakness, Loss of consciousness,  Tremor, insomnia, depression, anxiety, and suicidal ideation.     Objective:  BP 122/62 mmHg  Pulse 69  Temp(Src) 98 F (36.7 C) (Oral)  Resp 12  Ht 5\' 6"  (1.676 m)  Wt 175 lb 4 oz (79.493 kg)  BMI 28.30 kg/m2  SpO2 99%  Physical Exam   General Appearance:  Alert, cooperative, no distress, appears stated age  Head:    Normocephalic, without obvious abnormality, atraumatic  Eyes:    PERRL, conjunctiva/corneas clear, EOM's intact, fundi    benign, both eyes  Ears:    Normal TM's and external ear canals, both ears  Nose:   Nares normal, septum midline, mucosa normal, no drainage    or sinus tenderness  Throat:   Lips, mucosa, and tongue normal; teeth and gums normal  Neck:   Supple, symmetrical, trachea midline, no adenopathy;    thyroid:  no enlargement/tenderness/nodules; no carotid   bruit or JVD  Back:     Symmetric, no curvature, ROM normal, no CVA tenderness  Lungs:     Clear to auscultation bilaterally, respirations unlabored  Chest Wall:    No tenderness or deformity   Heart:     Regular rate and rhythm, S1 and S2 normal, no murmur, rub   or gallop  Breast Exam:    No tenderness, masses, or nipple abnormality  Abdomen:     Soft, non-tender, bowel sounds active all four quadrants,    no masses, no organomegaly  Genitalia:    Pelvic: cervix normal in appearance, external genitalia normal, no adnexal masses or tenderness, no cervical motion tenderness, rectovaginal septum normal, uterus normal size, shape, and consistency and vagina normal without discharge  Extremities:   Extremities normal, bilateral toe amputations, no cyanosis or edema  Pulses:   2+ and symmetric all extremities  Skin:   Skin color, texture, turgor normal, no rashes or lesions  Lymph nodes:   Cervical, supraclavicular, and axillary nodes normal  Neurologic:   CNII-XII intact, normal strength, sensation and reflexes    throughout    Assessment & Plan:   Problem List Items Addressed This Visit    Uncontrolled type 2 diabetes mellitus with peripheral circulatory disorder (HCC)    loss of control due to recent holidays.   Patient is up-to-date on eye exams and foot exam is unchanged today. Patient  has had  rine microalbumin to creatinine ratio repeated r. Patient is tolerating statin therapy for CAD risk reduction and on ACE/ARB for reduction in proteinuria.   Lab Results  Component Value Date   HGBA1C 9.5* 07/31/2015   Lab Results  Component Value Date   MICROALBUR 3.8* 07/31/2015   Lab Results  Component Value Date   NA 138 07/31/2015   NA 138 07/31/2015   K 4.6 07/31/2015   K 4.6 07/31/2015   CL 104 07/31/2015   CL 104 07/31/2015   CO2 26 07/31/2015   CO2 26 07/31/2015             Diabetic peripheral neuropathy associated with type 2 diabetes mellitus ([place])    complicated by PAD Resulting in prior foot ulcers and osteomyelitis . She has had bilateral toe amputations.  Now managed with ppdiatry follow up every 6 weeks         Hyperlipidemia associated with type 2 diabetes  mellitus ([place])    .Well controlled on current statin therapy.   Liver enzymes are normal , no changes today.  Lab Results  Component Value Date   CHOL 127 05/18/2015   HDL 44.30 05/18/2015   LDLCALC 54 05/18/2015   LDLDIRECT 58.0 07/31/2015   TRIG 141.0 05/18/2015   CHOLHDL 3 05/18/2015   Lab Results  Component Value Date   ALT 15 07/31/2015   AST 14 07/31/2015   ALKPHOS 81 07/31/2015   BILITOT 0.3 07/31/2015  Renal cancer [facility])    S/p nephrectomy and hernia repair April 2016. No recurrence by serial q 3 month scans.       RESOLVED: Type 2 diabetes mellitus (HCC) - Primary   Relevant Orders   Hemoglobin A1c (Completed)   LDL cholesterol, direct (Completed)   Microalbumin / creatinine urine ratio (Completed)   Comprehensive metabolic panel (Completed)   Hyperkalemia   Relevant Orders   Basic metabolic panel (Completed)   Encounter for preventive health examination    Annual wellness  exam was done as well as a comprehensive physical exam  .  During the course of the visit the patient was educated and counseled about appropriate screening and preventive services and screenings were brought up to date for cervical and breast cancer .  nutrition counseling, skin cancer screening has been recommended, along with review of the age appropriate recommended immunizations.  Printed recommendations for health maintenance screenings was given.         Other Visit Diagnoses    Screening for cervical cancer        Relevant Orders    Cytology - PAP (Completed)       I am having Ms. Spicer maintain her Vitamin D3, vitamin C, Biotin, Insulin Lispro Prot & Lispro, B-D ULTRAFINE III SHORT PEN, aspirin, Insulin Detemir, ALPRAZolam, ACCU-CHEK AVIVA PLUS, atorvastatin, gabapentin, digoxin, benzonatate, predniSONE, lisinopril, and clopidogrel.  No orders of the defined types were placed in this encounter.    There are no discontinued medications.  Follow-up: Return  in about 2 months (around 09/28/2015).   [name], MD

## 2015-07-31 NOTE — Patient Instructions (Addendum)
Please Return for fasting labs in early march   We are checking your potassium level today because it was slightly high in November.    I have given you a list of foods that are high and low in potassium .  Menopause is a normal process in which your reproductive ability comes to an end. This process happens gradually over a span of months to years, usually between the ages of 87 and 40. Menopause is complete when you have missed 12 consecutive menstrual periods. It is important to talk with your health care provider about some of the most common conditions that affect postmenopausal women, such as heart disease, cancer, and bone loss (osteoporosis). Adopting a healthy lifestyle and getting preventive care can help to promote your health and wellness. Those actions can also lower your chances of developing some of these common conditions. WHAT SHOULD I KNOW ABOUT MENOPAUSE? During menopause, you may experience a number of symptoms, such as:  Moderate-to-severe hot flashes.  Night sweats.  Decrease in sex drive.  Mood swings.  Headaches.  Tiredness.  Irritability.  Memory problems.  Insomnia. Choosing to treat or not to treat menopausal changes is an individual decision that you make with your health care provider. WHAT SHOULD I KNOW ABOUT HORMONE REPLACEMENT THERAPY AND SUPPLEMENTS? Hormone therapy products are effective for treating symptoms that are associated with menopause, such as hot flashes and night sweats. Hormone replacement carries certain risks, especially as you become older. If you are thinking about using estrogen or estrogen with progestin treatments, discuss the benefits and risks with your health care provider. WHAT SHOULD I KNOW ABOUT HEART DISEASE AND STROKE? Heart disease, heart attack, and stroke become more likely as you age. This may be due, in part, to the hormonal changes that your body experiences during menopause. These can affect how your body processes  dietary fats, triglycerides, and cholesterol. Heart attack and stroke are both medical emergencies. There are many things that you can do to help prevent heart disease and stroke:  Have your blood pressure checked at least every 1-2 years. High blood pressure causes heart disease and increases the risk of stroke.  If you are 50-44 years old, ask your health care provider if you should take aspirin to prevent a heart attack or a stroke.  Do not use any tobacco products, including cigarettes, chewing tobacco, or electronic cigarettes. If you need help quitting, ask your health care provider.  It is important to eat a healthy diet and maintain a healthy weight.  Be sure to include plenty of vegetables, fruits, low-fat dairy products, and lean protein.  Avoid eating foods that are high in solid fats, added sugars, or salt (sodium).  Get regular exercise. This is one of the most important things that you can do for your health.  Try to exercise for at least 150 minutes each week. The type of exercise that you do should increase your heart rate and make you sweat. This is known as moderate-intensity exercise.  Try to do strengthening exercises at least twice each week. Do these in addition to the moderate-intensity exercise.  Know your numbers.Ask your health care provider to check your cholesterol and your blood glucose. Continue to have your blood tested as directed by your health care provider. WHAT SHOULD I KNOW ABOUT CANCER SCREENING? There are several types of cancer. Take the following steps to reduce your risk and to catch any cancer development as early as possible. Breast Cancer  Practice breast self-awareness.  This means understanding how your breasts normally appear and feel.  It also means doing regular breast self-exams. Let your health care provider know about any changes, no matter how small.  If you are 22 or older, have a clinician do a breast exam (clinical breast exam  or CBE) every year. Depending on your age, family history, and medical history, it may be recommended that you also have a yearly breast X-ray (mammogram).  If you have a family history of breast cancer, talk with your health care provider about genetic screening.  If you are at high risk for breast cancer, talk with your health care provider about having an MRI and a mammogram every year.  Breast cancer (BRCA) gene test is recommended for women who have family members with BRCA-related cancers. Results of the assessment will determine the need for genetic counseling and BRCA1 and for BRCA2 testing. BRCA-related cancers include these types:  Breast. This occurs in males or females.  Ovarian.  Tubal. This may also be called fallopian tube cancer.  Cancer of the abdominal or pelvic lining (peritoneal cancer).  Prostate.  Pancreatic. Cervical, Uterine, and Ovarian Cancer Your health care provider may recommend that you be screened regularly for cancer of the pelvic organs. These include your ovaries, uterus, and vagina. This screening involves a pelvic exam, which includes checking for microscopic changes to the surface of your cervix (Pap test).  For women ages 21-65, health care providers may recommend a pelvic exam and a Pap test every three years. For women ages 30-65, they may recommend the Pap test and pelvic exam, combined with testing for human papilloma virus (HPV), every five years. Some types of HPV increase your risk of cervical cancer. Testing for HPV may also be done on women of any age who have unclear Pap test results.  Other health care providers may not recommend any screening for nonpregnant women who are considered low risk for pelvic cancer and have no symptoms. Ask your health care provider if a screening pelvic exam is right for you.  If you have had past treatment for cervical cancer or a condition that could lead to cancer, you need Pap tests and screening for cancer  for at least 20 years after your treatment. If Pap tests have been discontinued for you, your risk factors (such as having a new sexual partner) need to be reassessed to determine if you should start having screenings again. Some women have medical problems that increase the chance of getting cervical cancer. In these cases, your health care provider may recommend that you have screening and Pap tests more often.  If you have a family history of uterine cancer or ovarian cancer, talk with your health care provider about genetic screening.  If you have vaginal bleeding after reaching menopause, tell your health care provider.  There are currently no reliable tests available to screen for ovarian cancer. Lung Cancer Lung cancer screening is recommended for adults 48-74 years old who are at high risk for lung cancer because of a history of smoking. A yearly low-dose CT scan of the lungs is recommended if you:  Currently smoke.  Have a history of at least 30 pack-years of smoking and you currently smoke or have quit within the past 15 years. A pack-year is smoking an average of one pack of cigarettes per day for one year. Yearly screening should:  Continue until it has been 15 years since you quit.  Stop if you develop a health problem  that would prevent you from having lung cancer treatment. Colorectal Cancer  This type of cancer can be detected and can often be prevented.  Routine colorectal cancer screening usually begins at age 49 and continues through age 9.  If you have risk factors for colon cancer, your health care provider may recommend that you be screened at an earlier age.  If you have a family history of colorectal cancer, talk with your health care provider about genetic screening.  Your health care provider may also recommend using home test kits to check for hidden blood in your stool.  A small camera at the end of a tube can be used to examine your colon directly  (sigmoidoscopy or colonoscopy). This is done to check for the earliest forms of colorectal cancer.  Direct examination of the colon should be repeated every 5-10 years until age 63. However, if early forms of precancerous polyps or small growths are found or if you have a family history or genetic risk for colorectal cancer, you may need to be screened more often. Skin Cancer  Check your skin from head to toe regularly.  Monitor any moles. Be sure to tell your health care provider:  About any new moles or changes in moles, especially if there is a change in a mole's shape or color.  If you have a mole that is larger than the size of a pencil eraser.  If any of your family members has a history of skin cancer, especially at a young age, talk with your health care provider about genetic screening.  Always use sunscreen. Apply sunscreen liberally and repeatedly throughout the day.  Whenever you are outside, protect yourself by wearing long sleeves, pants, a wide-brimmed hat, and sunglasses. WHAT SHOULD I KNOW ABOUT OSTEOPOROSIS? Osteoporosis is a condition in which bone destruction happens more quickly than new bone creation. After menopause, you may be at an increased risk for osteoporosis. To help prevent osteoporosis or the bone fractures that can happen because of osteoporosis, the following is recommended:  If you are 52-2 years old, get at least 1,000 mg of calcium and at least 600 mg of vitamin D per day.  If you are older than age 104 but younger than age 41, get at least 1,200 mg of calcium and at least 600 mg of vitamin D per day.  If you are older than age 66, get at least 1,200 mg of calcium and at least 800 mg of vitamin D per day. Smoking and excessive alcohol intake increase the risk of osteoporosis. Eat foods that are rich in calcium and vitamin D, and do weight-bearing exercises several times each week as directed by your health care provider. WHAT SHOULD I KNOW ABOUT HOW  MENOPAUSE AFFECTS [place]? Depression may occur at any age, but it is more common as you become older. Common symptoms of depression include:  Low or sad mood.  Changes in sleep patterns.  Changes in appetite or eating patterns.  Feeling an overall lack of motivation or enjoyment of activities that you previously enjoyed.  Frequent crying spells. Talk with your health care provider if you think that you are experiencing depression. WHAT SHOULD I KNOW ABOUT IMMUNIZATIONS? It is important that you get and maintain your immunizations. These include:  Tetanus, diphtheria, and pertussis (Tdap) booster vaccine.  Influenza every year before the flu season begins.  Pneumonia vaccine.  Shingles vaccine. Your health care provider may also recommend other immunizations.   This information is  not intended to replace advice given to you by your health care provider. Make sure you discuss any questions you have with your health care provider.   Document Released: 08/15/2005 Document Revised: 07/14/2014 Document Reviewed: 02/23/2014 Elsevier Interactive Patient Education [employer].

## 2015-07-31 NOTE — Progress Notes (Signed)
Pre-visit discussion using our clinic review tool. No additional management support is needed unless otherwise documented below in the visit note.  

## 2015-08-01 LAB — MICROALBUMIN / CREATININE URINE RATIO
CREATININE, U: 23.9 mg/dL
MICROALB UR: 3.8 mg/dL — AB (ref 0.0–1.9)
Microalb Creat Ratio: 15.9 mg/g (ref 0.0–30.0)

## 2015-08-01 LAB — CYTOLOGY - PAP

## 2015-08-02 ENCOUNTER — Encounter: Payer: Self-pay | Admitting: Vascular Surgery

## 2015-08-02 ENCOUNTER — Encounter: Payer: Self-pay | Admitting: Internal Medicine

## 2015-08-02 DIAGNOSIS — Z0000 Encounter for general adult medical examination without abnormal findings: Secondary | ICD-10-CM | POA: Insufficient documentation

## 2015-08-02 DIAGNOSIS — E875 Hyperkalemia: Secondary | ICD-10-CM | POA: Insufficient documentation

## 2015-08-02 NOTE — Assessment & Plan Note (Signed)
.  Well controlled on current statin therapy.   Liver enzymes are normal , no changes today.  Lab Results  Component Value Date   CHOL 127 05/18/2015   HDL 44.30 05/18/2015   LDLCALC 54 05/18/2015   LDLDIRECT 58.0 07/31/2015   TRIG 141.0 05/18/2015   CHOLHDL 3 05/18/2015   Lab Results  Component Value Date   ALT 15 07/31/2015   AST 14 07/31/2015   ALKPHOS 81 07/31/2015   BILITOT 0.3 07/31/2015

## 2015-08-02 NOTE — Assessment & Plan Note (Signed)
complicated by PAD Resulting in prior foot ulcers and osteomyelitis . She has had bilateral toe amputations.  Now managed with ppdiatry follow up every 6 weeks

## 2015-08-02 NOTE — Assessment & Plan Note (Signed)
loss of control due to recent holidays.   Patient is up-to-date on eye exams and foot exam is unchanged today. Patient  has had  rine microalbumin to creatinine ratio repeated r. Patient is tolerating statin therapy for CAD risk reduction and on ACE/ARB for reduction in proteinuria.   Lab Results  Component Value Date   HGBA1C 9.5* 07/31/2015   Lab Results  Component Value Date   MICROALBUR 3.8* 07/31/2015   Lab Results  Component Value Date   NA 138 07/31/2015   NA 138 07/31/2015   K 4.6 07/31/2015   K 4.6 07/31/2015   CL 104 07/31/2015   CL 104 07/31/2015   CO2 26 07/31/2015   CO2 26 07/31/2015

## 2015-08-02 NOTE — Assessment & Plan Note (Signed)
S/p nephrectomy and hernia repair April 2016. No recurrence by serial q 3 month scans.

## 2015-08-02 NOTE — Assessment & Plan Note (Signed)
Annual wellness  exam was done as well as a comprehensive physical exam  .  During the course of the visit the patient was educated and counseled about appropriate screening and preventive services and screenings were brought up to date for cervical and breast cancer .   nutrition counseling, skin cancer screening has been recommended, along with review of the age appropriate recommended immunizations.  Printed recommendations for health maintenance screenings was given.   

## 2015-08-17 ENCOUNTER — Ambulatory Visit (INDEPENDENT_AMBULATORY_CARE_PROVIDER_SITE_OTHER): Payer: Medicare Other | Admitting: Urology

## 2015-08-17 VITALS — BP 138/68 | HR 76 | Ht 66.0 in | Wt 178.5 lb

## 2015-08-17 DIAGNOSIS — C642 Malignant neoplasm of left kidney, except renal pelvis: Secondary | ICD-10-CM | POA: Diagnosis not present

## 2015-08-17 LAB — URINALYSIS, COMPLETE
Bilirubin, UA: NEGATIVE
GLUCOSE, UA: NEGATIVE
Ketones, UA: NEGATIVE
Nitrite, UA: NEGATIVE
PROTEIN UA: NEGATIVE
SPEC GRAV UA: 1.01 (ref 1.005–1.030)
Urobilinogen, Ur: 0.2 mg/dL (ref 0.2–1.0)
pH, UA: 5 (ref 5.0–7.5)

## 2015-08-17 LAB — MICROSCOPIC EXAMINATION
Bacteria, UA: NONE SEEN
Renal Epithel, UA: NONE SEEN /hpf
WBC, UA: >30 /hpf — AB (ref 0–?)

## 2015-08-17 MED ORDER — CIPROFLOXACIN HCL 500 MG PO TABS
500.0000 mg | ORAL_TABLET | Freq: Once | ORAL | Status: AC
  Administered 2015-08-17: 500 mg via ORAL

## 2015-08-17 MED ORDER — LIDOCAINE HCL 2 % EX GEL
1.0000 "application " | Freq: Once | CUTANEOUS | Status: AC
  Administered 2015-08-17: 1 via URETHRAL

## 2015-08-17 NOTE — Progress Notes (Signed)
10:59 AM  08/17/2015  ANSLEIGH JAMMER 03-01-1961 [id number]  Referring provider: [name], MD [place], [place] [address]  Chief Complaint  Patient presents with  . Cysto    HPI:  1 - Left Upper Tract Transitional Cel Carcinoma - s/p LEFT robotic nephroureterectomy on 10/31/2014 as well as combined ventral hernia repair with Dr. Bary Castilla. Post complicated by wound infection now well healed. Pathology  [id number] with negative margins.  Recent Surveillance: 02/2015 - cysto NED; 05/2015 cysto NED, 08/2014- cysto NED  2 - Solitary Kidney  - s/p left nephrecotmy as per above. Most recent Cr 1.02 07/2015.   She does have an extensive smoking history, quit 3 years ago but smoked up to 2 packs a day for 35 years. She also has multiple medical comorbidities including history of diabetes, CAD status post CABG, carotid endarterectomy.    She presents today for routine cystoscopy.  Poor DM control, A1c 9.1.   PMH: Past Medical History  Diagnosis Date  . Diabetes mellitus   . Hypertension   . Hemorrhoid   . PVD (peripheral vascular disease) ([place])   . Heart murmur   . CHF (congestive heart failure) ([place])   . Coronary artery disease   . Cancer ([place])   . Urothelial carcinoma of kidney ([place]) 10/31/2014    INVASIVE UROTHELIAL CARCINOMA, LOW GRADE. T1, Nx.    Surgical History: Past Surgical History  Procedure Laterality Date  . Coronary artery bypass graft  2009    3 vessel  . Arterial bypass surgry  2009, 2013 x 2    right leg , done in [place]  . Cesarean section    . Carotid endarterectomy  July 2015    Dr Delana Meyer  . Cholecystectomy  03-03-12    Porcelain gallbladder, gallstones,  [place]  . Colonoscopy w/ biopsies  04/28/2012    Hyperplastic rectal polyps.  . Nephrectomy Left 10-31-14  . Hernia repair  10-31-14    ventral, retro-rectus atrium mesh  . Amputation toe    . Cataract extraction w/phaco Right 12/14/2014    Procedure: CATARACT  EXTRACTION PHACO AND INTRAOCULAR LENS PLACEMENT (IOC);  Surgeon: Lyla Glassing, MD;  Location: [facility];  Service: Ophthalmology;  Laterality: Right;  [place]   00:38.6              AP        7.1                   CDE  2.76  . Peripheral vascular catheterization Left 05/01/2015    Procedure: Lower Extremity Angiography;  Surgeon: Katha Cabal, MD;  Location: [facility];  Service: Cardiovascular;  Laterality: Left;  . Peripheral vascular catheterization  05/01/2015    Procedure: Lower Extremity Intervention;  Surgeon: Katha Cabal, MD;  Location: [facility];  Service: Cardiovascular;;  . Peripheral vascular catheterization Left 02/20/2015    Procedure: Pelvic Angiography;  Surgeon: Katha Cabal, MD;  Location: [facility];  Service: Cardiovascular;  Laterality: Left;    Home Medications:    Medication List       This list is accurate as of: 08/17/15 10:59 AM.  Always use your most recent med list.               ACCU-CHEK AVIVA PLUS test strip  Generic drug:  glucose blood  CHECK  BLOOD  SUGAR    TWO TO  THREE  TIMES  A  DAY     ALPRAZolam 0.5 MG tablet  Commonly known as:  XANAX  Take 1 tablet (0.5 mg total) by mouth 3 (three) times daily as needed.     aspirin 81 MG chewable tablet  Chew 81 mg by mouth daily.     atorvastatin 20 MG tablet  Commonly known as:  LIPITOR  TAKE 1 TABLET BY MOUTH EVERY DAY     B-D ULTRAFINE III SHORT PEN 31G X 8 MM Misc  Generic drug:  Insulin Pen Needle  TEST BLOOD SUGAR TWICE DAILY     benzonatate 100 MG capsule  Commonly known as:  TESSALON  Take 2 capsules (200 mg total) by mouth 3 (three) times daily as needed for cough.     Biotin 1000 MCG tablet  Take 1,000 mcg by mouth daily.     clopidogrel 75 MG tablet  Commonly known as:  PLAVIX  TAKE 1 TABLET BY MOUTH EVERY DAY     digoxin 0.125 MG tablet  Commonly known as:  LANOXIN  TAKE 1 TABLET BY MOUTH EVERY DAY     gabapentin 600 MG tablet    Commonly known as:  NEURONTIN  TAKE 1 TABLET BY MOUTH THREE TIMES DAILY     Insulin Detemir 100 UNIT/ML Pen  Commonly known as:  LEVEMIR FLEXTOUCH  Inject 40 Units into the skin daily at 10 pm.     Insulin Lispro Prot & Lispro (75-25) 100 UNIT/ML Kwikpen  Commonly known as:  HUMALOG 75/25 MIX  30 units in the am 36 units in the pm     lisinopril 5 MG tablet  Commonly known as:  PRINIVIL,ZESTRIL  Take 2 tablets (10 mg total) by mouth daily.     predniSONE 10 MG tablet  Commonly known as:  DELTASONE  6 tablets on Day 1 , then reduce by 1 tablet daily until gone     vitamin C 500 MG tablet  Commonly known as:  ASCORBIC ACID  Take 1,000 mg by mouth 2 (two) times daily.     Vitamin D3 2000 units Tabs  Take 1 tablet by mouth daily.        Allergies: No Known Allergies  Family History: Family History  Problem Relation Age of Onset  . Cancer Mother 65    Lung Cancer  . Cancer Father 62    Lung Ca  . Breast cancer Neg Hx     Social History:  reports that she quit smoking about 4 years ago. Her smoking use included Cigarettes. She has never used smokeless tobacco. She reports that she does not drink alcohol or use illicit drugs.  Physical Exam: BP 138/68 mmHg  Pulse 76  Ht 5\' 6"  (1.676 m)  Wt 178 lb 8 oz (80.967 kg)  BMI 28.82 kg/m2  Constitutional:  Alert and oriented, No acute distress. HEENT: [place] AT, moist mucus membranes.  Trachea midline, no masses. Cardiovascular: No clubbing, cyanosis, or edema. Respiratory: Normal respiratory effort, no increased work of breathing. GI: Abdomen is soft, nontender, nondistended, no abdominal masses.  Wounds well healed, no abdominal hernia.   GU: No CVA tenderness. Normal external genitalia.  Normal meatus. Skin: No rashes, bruises or suspicious lesions. Neurologic: Grossly intact, no focal deficits, moving all 4 extremities. Psychiatric: Normal mood and affect.  Laboratory Data: Lab Results  Component Value Date   WBC 13.4*  05/18/2015   HGB 12.7 05/18/2015   HCT 38.5 05/18/2015   MCV 86.6 05/18/2015   PLT 282.0 05/18/2015  Lab Results  Component Value Date   CREATININE 1.03 07/31/2015   CREATININE 1.02 07/31/2015    Lab Results  Component Value Date   HGBA1C 9.5* 07/31/2015     Cystoscopy Procedure Note  Patient identification was confirmed, informed consent was obtained, and patient was prepped using Betadine solution.  Lidocaine jelly was administered per urethral meatus.    Preoperative abx where received prior to procedure.    Procedure: - Flexible cystoscope introduced, without any difficulty.   - Thorough search of the bladder revealed:    normal urethral meatus    normal urothelium    no stones    no ulcers     no tumors    no urethral polyps    no trabeculation  - Right UO normal in position and appearance, Left UO not clearly visualized (surgically absent).  Post-Procedure: - Patient tolerated the procedure well. Fonnie Jarvis chaparone today.   Assessment & Plan:    1 - Left Upper Tract Transitional Cel Carcinoma - NED by cysto today. Continue Q25mo cysto x 1 year, then less frequent Will arrange for repeat CT Urogram/ BMP in 2 months for surveillance, will review results at next cysto  2 - Solitary Kidney  - Excellent overall GFR by recent serum labs, observe.  3 - RTC 3 mo for cysto/ CT scan results with Dr. Erlene Quan.    Return in about 3 months (around 11/14/2015) for cystoscopy, f/u CT scan results.  Hollice Espy, MD  [facility] [address], [place], [place] [address] [phone]

## 2015-08-28 ENCOUNTER — Other Ambulatory Visit: Payer: Self-pay | Admitting: Internal Medicine

## 2015-08-29 DIAGNOSIS — I739 Peripheral vascular disease, unspecified: Secondary | ICD-10-CM | POA: Diagnosis not present

## 2015-08-29 DIAGNOSIS — I2510 Atherosclerotic heart disease of native coronary artery without angina pectoris: Secondary | ICD-10-CM | POA: Diagnosis not present

## 2015-08-29 DIAGNOSIS — I25810 Atherosclerosis of coronary artery bypass graft(s) without angina pectoris: Secondary | ICD-10-CM | POA: Diagnosis not present

## 2015-08-29 DIAGNOSIS — E782 Mixed hyperlipidemia: Secondary | ICD-10-CM | POA: Diagnosis not present

## 2015-08-30 DIAGNOSIS — E113592 Type 2 diabetes mellitus with proliferative diabetic retinopathy without macular edema, left eye: Secondary | ICD-10-CM | POA: Diagnosis not present

## 2015-09-11 DIAGNOSIS — Z794 Long term (current) use of insulin: Secondary | ICD-10-CM | POA: Diagnosis not present

## 2015-09-11 DIAGNOSIS — B351 Tinea unguium: Secondary | ICD-10-CM | POA: Diagnosis not present

## 2015-09-11 DIAGNOSIS — L851 Acquired keratosis [keratoderma] palmaris et plantaris: Secondary | ICD-10-CM | POA: Diagnosis not present

## 2015-09-11 DIAGNOSIS — E1140 Type 2 diabetes mellitus with diabetic neuropathy, unspecified: Secondary | ICD-10-CM | POA: Diagnosis not present

## 2015-09-13 ENCOUNTER — Other Ambulatory Visit: Payer: Self-pay | Admitting: Internal Medicine

## 2015-09-13 DIAGNOSIS — E162 Hypoglycemia, unspecified: Secondary | ICD-10-CM | POA: Diagnosis not present

## 2015-09-27 ENCOUNTER — Other Ambulatory Visit: Payer: Self-pay | Admitting: Internal Medicine

## 2015-09-30 ENCOUNTER — Other Ambulatory Visit: Payer: Self-pay | Admitting: Internal Medicine

## 2015-10-01 ENCOUNTER — Other Ambulatory Visit (INDEPENDENT_AMBULATORY_CARE_PROVIDER_SITE_OTHER): Payer: Medicare Other

## 2015-10-01 ENCOUNTER — Telehealth: Payer: Self-pay | Admitting: *Deleted

## 2015-10-01 DIAGNOSIS — I10 Essential (primary) hypertension: Secondary | ICD-10-CM

## 2015-10-01 DIAGNOSIS — E1165 Type 2 diabetes mellitus with hyperglycemia: Secondary | ICD-10-CM | POA: Diagnosis not present

## 2015-10-01 DIAGNOSIS — IMO0002 Reserved for concepts with insufficient information to code with codable children: Secondary | ICD-10-CM

## 2015-10-01 DIAGNOSIS — E1151 Type 2 diabetes mellitus with diabetic peripheral angiopathy without gangrene: Secondary | ICD-10-CM

## 2015-10-01 DIAGNOSIS — C649 Malignant neoplasm of unspecified kidney, except renal pelvis: Secondary | ICD-10-CM

## 2015-10-01 LAB — LIPID PANEL
CHOL/HDL RATIO: 3
CHOLESTEROL: 110 mg/dL (ref 0–200)
HDL: 36.9 mg/dL — AB (ref >39.00)
LDL Cholesterol: 48 mg/dL (ref 0–99)
NonHDL: 73.16
TRIGLYCERIDES: 125 mg/dL (ref 0.0–149.0)
VLDL: 25 mg/dL (ref 0.0–40.0)

## 2015-10-01 LAB — COMPREHENSIVE METABOLIC PANEL
ALBUMIN: 3.9 g/dL (ref 3.5–5.2)
ALK PHOS: 72 U/L (ref 39–117)
ALT: 20 U/L (ref 0–35)
AST: 17 U/L (ref 0–37)
BILIRUBIN TOTAL: 0.3 mg/dL (ref 0.2–1.2)
BUN: 32 mg/dL — ABNORMAL HIGH (ref 6–23)
CALCIUM: 9.5 mg/dL (ref 8.4–10.5)
CO2: 29 mEq/L (ref 19–32)
CREATININE: 0.97 mg/dL (ref 0.40–1.20)
Chloride: 103 mEq/L (ref 96–112)
GFR: 62.9 mL/min (ref >60.00)
Glucose, Bld: 56 mg/dL — ABNORMAL LOW (ref 70–99)
Potassium: 4.6 mEq/L (ref 3.5–5.1)
Sodium: 138 mEq/L (ref 135–145)
TOTAL PROTEIN: 6.6 g/dL (ref 6.0–8.3)

## 2015-10-01 LAB — MICROALBUMIN / CREATININE URINE RATIO
CREATININE, U: 58.6 mg/dL
MICROALB UR: 11.8 mg/dL — AB (ref 0.0–1.9)
MICROALB/CREAT RATIO: 20.1 mg/g (ref 0.0–30.0)

## 2015-10-01 LAB — HEMOGLOBIN A1C: Hgb A1c MFr Bld: 7.8 % — ABNORMAL HIGH (ref 4.6–6.5)

## 2015-10-01 IMAGING — CT CT ANGIOGRAPHY NECK
1 series · 12 of 14 positions shown · IV contrast (APPLIED)
Comparison: none

REASON FOR EXAM: carotid artery stenosis
COMMENTS:

PROCEDURE:     KCT - KCT ANGIOGRAPHY NECK W/WO  - April 15, 2013  [DATE]
RESULT:
TECHNIQUE: Axial source imaging was obtained status post intravenous
administration of 100 ml of [id number]. Also included are coronal and
sagittal maximum intensity projections using 8 mm helical acquisition. The
vessels were also further evaluated with Syngo vessel view software.

[Series 4: 3 soft tissue · axial · 0.43mm/px · z∈[-32,+240]mm · 12 of 109 slices shown]
[im 9/109  soft-tissue]
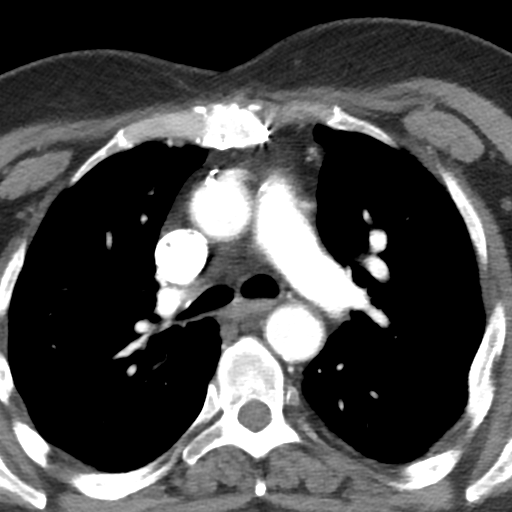
[im 17/109  bone]
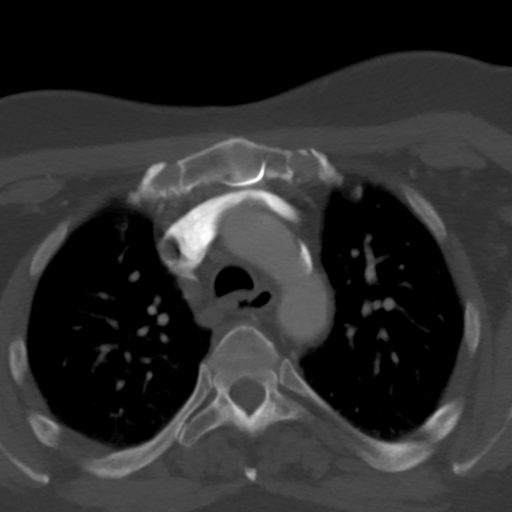
[im 25/109  soft-tissue]
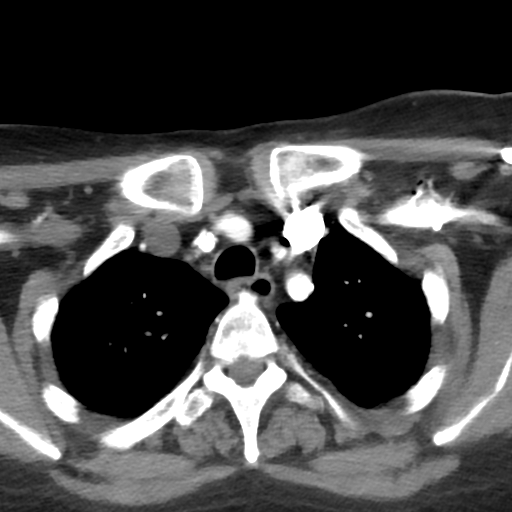
[im 34/109  bone]
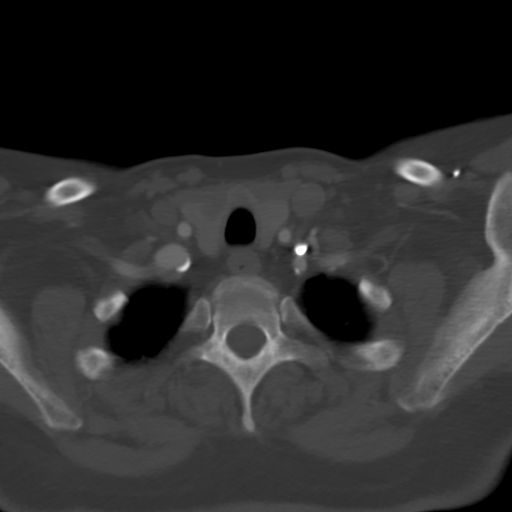
[im 42/109  soft-tissue]
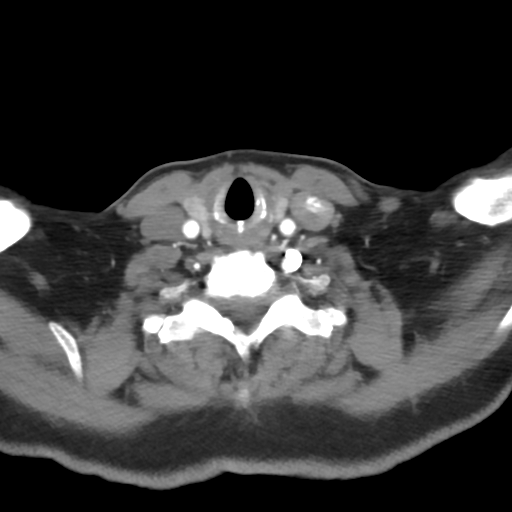
[im 50/109  bone]
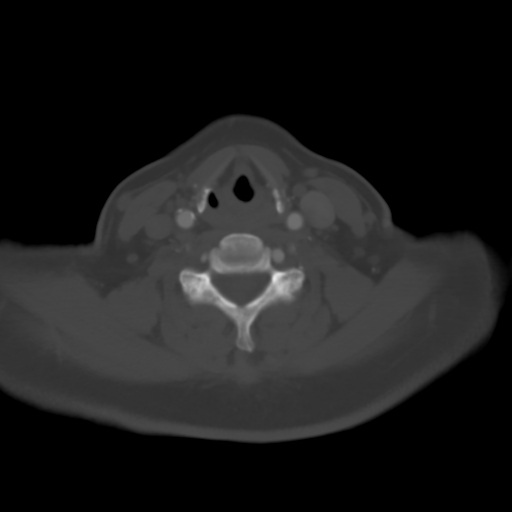
[im 59/109  soft-tissue]
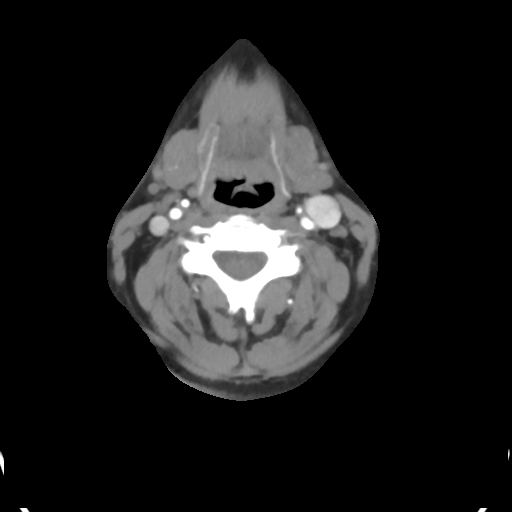
[im 67/109  bone]
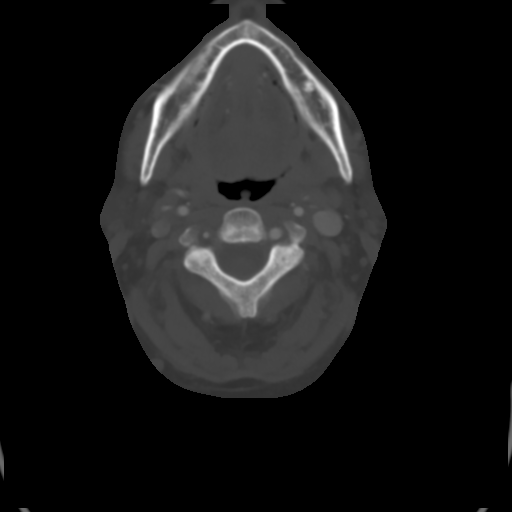
[im 75/109  soft-tissue]
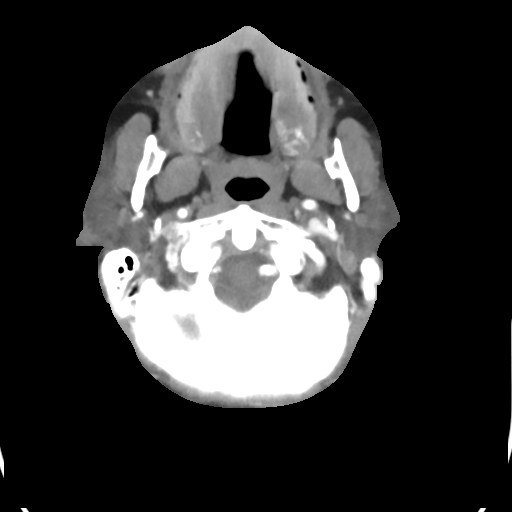
[im 84/109  bone]
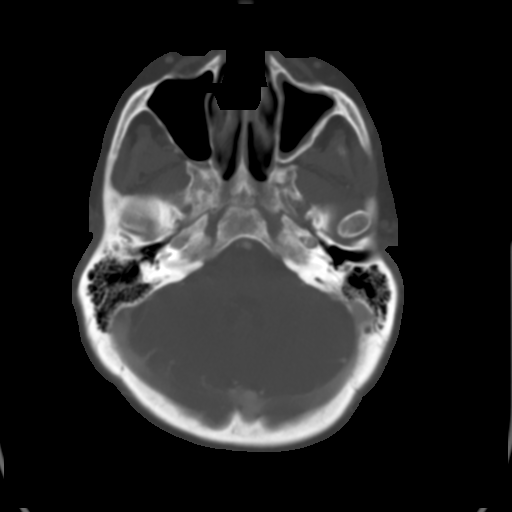
[im 92/109  soft-tissue]
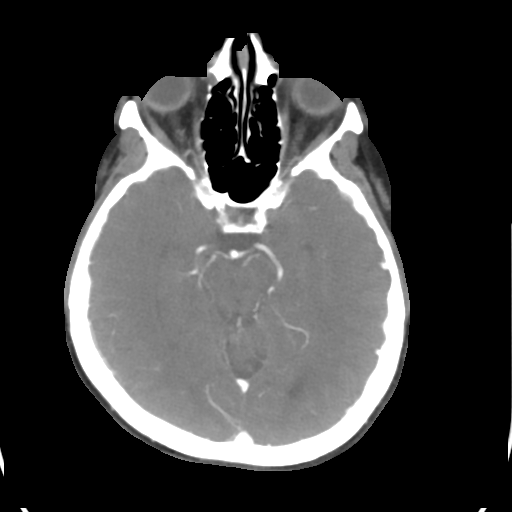
[im 100/109  bone]
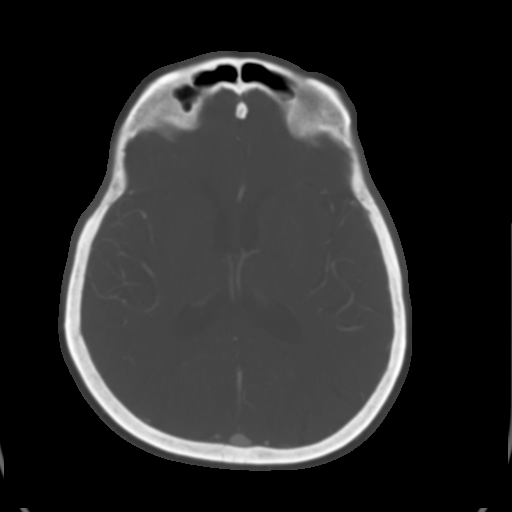

[12 of 14 positions shown; findings below may reference images not displayed]

FINDINGS: Evaluation of the left carotid system demonstrates coarse mural
calcifications at the level of the carotid bulb. There are findings
consistent with high-grade stenosis at the origin of the bulb on the
magnitude of 75-80%. This extends into the origin of the internal carotid
artery where there is an area concerning for stenosis on the magnitude of
65-70%. A focal punctate area of calcification projects within the distal
portion of the internal carotid artery. At the origin of the intracranial
portion of the artery, there is an area of coarse mural calcification and
findings concerning for moderate to high-grade stenosis on the magnitude of
60-70%.

Evaluation of the right carotid system demonstrates coarse mural
calcifications within the carotid bulb. There are findings concerning for
high-grade stenosis within the bulb on the magnitude of 85-90%. This extends
into the origin of the internal carotid artery. The internal carotid artery
otherwise appears without evidence of focal or segmental stenosis.

There is no evidence of fusiform dilatation or focal outpouchings within the
right or left carotid systems.
IMPRESSION: Areas concerning for hemodynamically significant stenosis
within the right and left carotid systems as described above.

## 2015-10-01 NOTE — Telephone Encounter (Signed)
Labs and dx?  

## 2015-10-04 ENCOUNTER — Encounter: Payer: Self-pay | Admitting: Internal Medicine

## 2015-10-04 ENCOUNTER — Ambulatory Visit (INDEPENDENT_AMBULATORY_CARE_PROVIDER_SITE_OTHER): Payer: Medicare Other | Admitting: Internal Medicine

## 2015-10-04 ENCOUNTER — Other Ambulatory Visit: Payer: Self-pay | Admitting: Internal Medicine

## 2015-10-04 VITALS — BP 132/65 | HR 70 | Temp 98.5°F | Wt 171.0 lb

## 2015-10-04 DIAGNOSIS — J209 Acute bronchitis, unspecified: Secondary | ICD-10-CM | POA: Insufficient documentation

## 2015-10-04 MED ORDER — AMOXICILLIN-POT CLAVULANATE 875-125 MG PO TABS: 1.0000 | ORAL_TABLET | Freq: Two times a day (BID) | ORAL | Status: DC

## 2015-10-04 MED ORDER — HYDROCODONE-HOMATROPINE 5-1.5 MG/5ML PO SYRP: 5.0000 mL | ORAL_SOLUTION | Freq: Three times a day (TID) | ORAL | Status: DC | PRN

## 2015-10-04 NOTE — Assessment & Plan Note (Signed)
Symptoms and exam are c/w bronchitis. Start Augmentin. She will use a probiotic while on this medication. Use Hycodan as needed for cough. Discussed that this medication may make her drowsy. Return precautions given. Follow up next week if symptoms are not improving.

## 2015-10-04 NOTE — Patient Instructions (Signed)
Start Augmentin twice daily.  Use Hydcodan as needed for cough.  Follow up if symptoms are not improving.

## 2015-10-04 NOTE — Progress Notes (Signed)
Pre visit review using our clinic review tool, if applicable. No additional management support is needed unless otherwise documented below in the visit note. 

## 2015-10-04 NOTE — Progress Notes (Signed)
Subjective:    Patient ID: AMIYHA BUMPUS, female    DOB: Jan 30, 1961, 55 y.o.   MRN: [id number]  HPI  55YO female presents for acute visit.  Became sick 3/17 with fever, cough, sore throat. Cough has been persistent. Unable to sleep because of cough. Cough productive at times. No chest pain, dyspnea. Otherwise, feeling well. Taking Mucinex for cough. Minimal improvement with this.  Wt Readings from Last 3 Encounters:  10/04/15 171 lb (77.565 kg)  08/17/15 178 lb 8 oz (80.967 kg)  07/31/15 175 lb 4 oz (79.493 kg)   BP Readings from Last 3 Encounters:  10/04/15 132/65  08/17/15 138/68  07/31/15 122/62    Past Medical History  Diagnosis Date  . Diabetes mellitus   . Hypertension   . Hemorrhoid   . PVD (peripheral vascular disease) ([place])   . Heart murmur   . CHF (congestive heart failure) ([place])   . Coronary artery disease   . Cancer ([place])   . Urothelial carcinoma of kidney ([place]) 10/31/2014    INVASIVE UROTHELIAL CARCINOMA, LOW GRADE. T1, Nx.   Family History  Problem Relation Age of Onset  . Cancer Mother 65    Lung Cancer  . Cancer Father 1    Lung Ca  . Breast cancer Neg Hx    Past Surgical History  Procedure Laterality Date  . Coronary artery bypass graft  2009    3 vessel  . Arterial bypass surgry  2009, 2013 x 2    right leg , done in [place]  . Cesarean section    . Carotid endarterectomy  July 2015    Dr Delana Meyer  . Cholecystectomy  03-03-12    Porcelain gallbladder, gallstones,  [place]  . Colonoscopy w/ biopsies  04/28/2012    Hyperplastic rectal polyps.  . Nephrectomy Left 10-31-14  . Hernia repair  10-31-14    ventral, retro-rectus atrium mesh  . Amputation toe    . Cataract extraction w/phaco Right 12/14/2014    Procedure: CATARACT EXTRACTION PHACO AND INTRAOCULAR LENS PLACEMENT (IOC);  Surgeon: Lyla Glassing, MD;  Location: [facility];  Service: Ophthalmology;  Laterality: Right;  [place]   00:38.6              AP        7.1                   CDE   2.76  . Peripheral vascular catheterization Left 05/01/2015    Procedure: Lower Extremity Angiography;  Surgeon: Katha Cabal, MD;  Location: [place] [facility];  Service: Cardiovascular;  Laterality: Left;  . Peripheral vascular catheterization  05/01/2015    Procedure: Lower Extremity Intervention;  Surgeon: Katha Cabal, MD;  Location: [facility];  Service: Cardiovascular;;  . Peripheral vascular catheterization Left 02/20/2015    Procedure: Pelvic Angiography;  Surgeon: Katha Cabal, MD;  Location: [facility];  Service: Cardiovascular;  Laterality: Left;   Social History   Social History  . Marital Status: Married    Spouse Name: N/A  . Number of Children: N/A  . Years of Education: N/A   Social History Main Topics  . Smoking status: Former Smoker    Types: Cigarettes    Quit date: 03/30/2011  . Smokeless tobacco: Never Used  . Alcohol Use: No  . Drug Use: No  . Sexual Activity: Not Currently   Other Topics Concern  . None   Social History Narrative  Review of Systems  Constitutional: Positive for fever and fatigue. Negative for chills, appetite change and unexpected weight change.  HENT: Positive for congestion, postnasal drip, sinus pressure and sore throat. Negative for ear discharge, ear pain, facial swelling, hearing loss, mouth sores, nosebleeds, rhinorrhea, sneezing, tinnitus, trouble swallowing and voice change.   Eyes: Negative for pain, discharge, redness and visual disturbance.  Respiratory: Positive for cough. Negative for chest tightness, shortness of breath, wheezing and stridor.   Cardiovascular: Negative for chest pain, palpitations and leg swelling.  Gastrointestinal: Negative for nausea, vomiting, abdominal pain, diarrhea and constipation.  Musculoskeletal: Negative for myalgias, arthralgias, neck pain and neck stiffness.  Skin: Negative for color change and rash.  Neurological: Negative for dizziness, weakness,  light-headedness and headaches.  Hematological: Negative for adenopathy. Does not bruise/bleed easily.  Psychiatric/Behavioral: Positive for sleep disturbance. Negative for dysphoric mood. The patient is not nervous/anxious.        Objective:    BP 132/65 mmHg  Pulse 70  Temp(Src) 98.5 F (36.9 C) (Oral)  Wt 171 lb (77.565 kg)  SpO2 100% Physical Exam  Constitutional: She is oriented to person, place, and time. She appears well-developed and well-nourished. No distress.  HENT:  Head: Normocephalic and atraumatic.  Right Ear: External ear normal.  Left Ear: External ear normal.  Nose: Nose normal.  Mouth/Throat: Oropharynx is clear and moist. No oropharyngeal exudate.  Eyes: Conjunctivae are normal. Pupils are equal, round, and reactive to light. Right eye exhibits no discharge. Left eye exhibits no discharge. No scleral icterus.  Neck: Normal range of motion. Neck supple. No tracheal deviation present. No thyromegaly present.  Cardiovascular: Normal rate, regular rhythm, normal heart sounds and intact distal pulses.  Exam reveals no gallop and no friction rub.   No murmur heard. Pulmonary/Chest: Effort normal. No accessory muscle usage. No respiratory distress. She has no decreased breath sounds. She has no wheezes. She has rhonchi (few scattered). She has no rales. She exhibits no tenderness.  Musculoskeletal: Normal range of motion. She exhibits no edema or tenderness.  Lymphadenopathy:    She has no cervical adenopathy.  Neurological: She is alert and oriented to person, place, and time. No cranial nerve deficit. She exhibits normal muscle tone. Coordination normal.  Skin: Skin is warm and dry. No rash noted. She is not diaphoretic. No erythema. No pallor.  Psychiatric: She has a normal mood and affect. Her behavior is normal. Judgment and thought content normal.          Assessment & Plan:   Problem List Items Addressed This Visit      Unprioritized   Acute bronchitis  - Primary    Symptoms and exam are c/w bronchitis. Start Augmentin. She will use a probiotic while on this medication. Use Hycodan as needed for cough. Discussed that this medication may make her drowsy. Return precautions given. Follow up next week if symptoms are not improving.      Relevant Medications   amoxicillin-clavulanate (AUGMENTIN) 875-125 MG tablet   HYDROcodone-homatropine (HYCODAN) 5-1.5 MG/5ML syrup       Return if symptoms worsen or fail to improve.  Ronette Deter, MD Internal Medicine [facility]

## 2015-10-05 DIAGNOSIS — H02409 Unspecified ptosis of unspecified eyelid: Secondary | ICD-10-CM | POA: Diagnosis not present

## 2015-10-11 ENCOUNTER — Other Ambulatory Visit: Payer: Self-pay | Admitting: Internal Medicine

## 2015-10-11 DIAGNOSIS — H02403 Unspecified ptosis of bilateral eyelids: Secondary | ICD-10-CM | POA: Diagnosis not present

## 2015-10-15 ENCOUNTER — Other Ambulatory Visit: Payer: Self-pay | Admitting: Internal Medicine

## 2015-10-17 ENCOUNTER — Encounter: Payer: Self-pay | Admitting: Internal Medicine

## 2015-10-17 ENCOUNTER — Ambulatory Visit (INDEPENDENT_AMBULATORY_CARE_PROVIDER_SITE_OTHER): Payer: Medicare Other | Admitting: Internal Medicine

## 2015-10-17 VITALS — BP 156/70 | HR 77 | Temp 99.1°F | Resp 12 | Ht 66.0 in | Wt 173.2 lb

## 2015-10-17 DIAGNOSIS — E1165 Type 2 diabetes mellitus with hyperglycemia: Secondary | ICD-10-CM | POA: Diagnosis not present

## 2015-10-17 DIAGNOSIS — E1151 Type 2 diabetes mellitus with diabetic peripheral angiopathy without gangrene: Secondary | ICD-10-CM

## 2015-10-17 DIAGNOSIS — I10 Essential (primary) hypertension: Secondary | ICD-10-CM

## 2015-10-17 DIAGNOSIS — E1142 Type 2 diabetes mellitus with diabetic polyneuropathy: Secondary | ICD-10-CM | POA: Diagnosis not present

## 2015-10-17 DIAGNOSIS — IMO0002 Reserved for concepts with insufficient information to code with codable children: Secondary | ICD-10-CM

## 2015-10-17 MED ORDER — LISINOPRIL 20 MG PO TABS: 20.0000 mg | ORAL_TABLET | Freq: Every day | ORAL | Status: DC

## 2015-10-17 NOTE — Patient Instructions (Addendum)
  For your allergies ,  You can use Benadryl but you should also consider adding one of these newer second generation antihistamines that are longer acting, non sedating and  available OTC:  Generic  Zyrtec, which is cetirizine.    generic Allegra , available generically as fexofenadine ; comes in 60 mg and 180 mg once daily strengths.    Generic Claritin :  also available as loratidine .   Your blood sugars are much better .  No changes today Make sure you keep your insulin doses about 12 hours apart .  Use the Premier  Protein shakes as a breakfast alternative.  You could add a piece of fruit .   To make a low carb chip :  Take the Joseph's Lavash or Pita bread,  Or the [place] Low carb whole wheat tortilla   Place on metal cookie sheet  Brush with olive oil  Sprinkle garlic powder (NOT garlic salt), grated parmesan cheese, mediterranean seasoning , or all of them?  Bake at 225 or 250 for 90 minutes

## 2015-10-17 NOTE — Progress Notes (Signed)
Subjective:  Patient ID: Laura Reed, female    DOB: Jan 21, 1961  Age: 55 y.o. MRN: [id number]  CC: The primary encounter diagnosis was Diabetic peripheral neuropathy associated with type 2 diabetes mellitus ([place]). Diagnoses of Uncontrolled type 2 diabetes mellitus with peripheral circulatory disorder ([place]) and Essential hypertension were also pertinent to this visit.  HPI Laura Reed presents for follow up on type  2 DM  Patient recently had a severe hypoglycemic event with AMS .  She was found by her sister and EMS was called. CBG was 25 by EMS  Had been skipping breakfast and giving herself insulin  at lunch   Sugars now 130 to 150 on 30 units of 75/25  in the am and 36 units in the evening   Patient has not had any falls except when sugar was 25.  Last fall prior to that was 6-9 months ago No balance issues .   EYE SURGERY  June 1  .  We discussed suspending plavix and asa one week prior  She has been having a persistent cough despite recovering from sinusitis  with fevers.   Cough finally resolving  Using mucinex .  Avoiding nasal rainses as she has a perforated septum ,  The cough syrup was $27.     Lab Results  Component Value Date   HGBA1C 7.8* 10/01/2015      Outpatient Prescriptions Prior to Visit  Medication Sig Dispense Refill  . ACCU-CHEK AVIVA PLUS test strip CHECK  BLOOD  SUGAR    TWO TO  THREE  TIMES  A  DAY  6  . ALPRAZolam (XANAX) 0.5 MG tablet TAKE 1 TABLET BY MOUTH THREE TIMES DAILY AS NEEDED 90 tablet 1  . aspirin 81 MG chewable tablet Chew 81 mg by mouth daily.    Marland Kitchen atorvastatin (LIPITOR) 20 MG tablet TAKE 1 TABLET BY MOUTH EVERY DAY 90 tablet 0  . B-D ULTRAFINE III SHORT PEN 31G X 8 MM MISC TEST BLOOD SUGAR TWICE DAILY 100 each 5  . Biotin 1000 MCG tablet Take 1,000 mcg by mouth daily.     . Cholecalciferol (VITAMIN D3) 2000 UNITS TABS Take 1 tablet by mouth daily.     . clopidogrel (PLAVIX) 75 MG tablet TAKE 1 TABLET BY MOUTH EVERY DAY 90 tablet 1    . digoxin (LANOXIN) 0.125 MG tablet TAKE 1 TABLET BY MOUTH EVERY DAY 90 tablet 0  . gabapentin (NEURONTIN) 600 MG tablet TAKE 1 TABLET BY MOUTH THREE TIMES DAILY 270 tablet 1  . HUMALOG MIX 75/25 KWIKPEN (75-25) 100 UNIT/ML Kwikpen INJECT 30 UNITS UNDER THE SKIN EVERY MORNING AND 36 UNITS EVERY EVENING 30 mL 0  . LEVEMIR FLEXTOUCH 100 UNIT/ML Pen INJECT 40 UNITS UNDER THE SKIN EVERY DAY AT 10 PM 15 mL 3  . TRUEPLUS LANCETS 28G MISC CHECK BLOOD SUGAR FOUR TIMES DAILY 200 each 2  . vitamin C (ASCORBIC ACID) 500 MG tablet Take 1,000 mg by mouth 2 (two) times daily.    Marland Kitchen amoxicillin-clavulanate (AUGMENTIN) 875-125 MG tablet Take 1 tablet by mouth 2 (two) times daily. 20 tablet 0  . lisinopril (PRINIVIL,ZESTRIL) 5 MG tablet TAKE 1 TABLET(5 MG) BY MOUTH DAILY (Patient taking differently: TAKE 2 TABLET(10 MG) BY MOUTH DAILY) 90 tablet 0  . HYDROcodone-homatropine (HYCODAN) 5-1.5 MG/5ML syrup Take 5 mLs by mouth every 8 (eight) hours as needed for cough. 120 mL 0   No facility-administered medications prior to visit.    Review of Systems;  Patient denies headache, fevers, malaise, unintentional weight loss, skin rash, eye pain, sinus congestion and sinus pain, sore throat, dysphagia,  hemoptysis , cough, dyspnea, wheezing, chest pain, palpitations, orthopnea, edema, abdominal pain, nausea, melena, diarrhea, constipation, flank pain, dysuria, hematuria, urinary  Frequency, nocturia, numbness, tingling, seizures,  Focal weakness, Loss of consciousness,  Tremor, insomnia, depression, anxiety, and suicidal ideation.      Objective:  BP 156/70 mmHg  Pulse 77  Temp(Src) 99.1 F (37.3 C) (Oral)  Resp 12  Ht 5\' 6"  (1.676 m)  Wt 173 lb 4 oz (78.586 kg)  BMI 27.98 kg/m2  SpO2 98%  BP Readings from Last 3 Encounters:  10/17/15 156/70  10/04/15 132/65  08/17/15 138/68    Wt Readings from Last 3 Encounters:  10/17/15 173 lb 4 oz (78.586 kg)  10/04/15 171 lb (77.565 kg)  08/17/15 178 lb 8 oz  (80.967 kg)    General appearance: alert, cooperative and appears stated age Ears: normal TM's and external ear canals both ears Throat: lips, mucosa, and tongue normal; teeth and gums normal Neck: no adenopathy, no carotid bruit, supple, symmetrical, trachea midline and thyroid not enlarged, symmetric, no tenderness/mass/nodules Back: symmetric, no curvature. ROM normal. No CVA tenderness. Lungs: clear to auscultation bilaterally Heart: regular rate and rhythm, S1, S2 normal, no murmur, click, rub or gallop Abdomen: soft, non-tender; bowel sounds normal; no masses,  no organomegaly Pulses: 2+ and symmetric Skin: Skin color, texture, turgor normal. No rashes or lesions Lymph nodes: Cervical, supraclavicular, and axillary nodes normal.  Lab Results  Component Value Date   HGBA1C 7.8* 10/01/2015   HGBA1C 9.5* 07/31/2015   HGBA1C 7.3* 05/18/2015    Lab Results  Component Value Date   CREATININE 0.97 10/01/2015   CREATININE 1.03 07/31/2015   CREATININE 1.02 07/31/2015    Lab Results  Component Value Date   WBC 13.4* 05/18/2015   HGB 12.7 05/18/2015   HCT 38.5 05/18/2015   PLT 282.0 05/18/2015   GLUCOSE 56* 10/01/2015   CHOL 110 10/01/2015   TRIG 125.0 10/01/2015   HDL 36.90* 10/01/2015   LDLDIRECT 58.0 07/31/2015   LDLCALC 48 10/01/2015   ALT 20 10/01/2015   AST 17 10/01/2015   NA 138 10/01/2015   K 4.6 10/01/2015   CL 103 10/01/2015   CREATININE 0.97 10/01/2015   BUN 32* 10/01/2015   CO2 29 10/01/2015   TSH 0.96 05/18/2015   INR 0.9 10/17/2014   HGBA1C 7.8* 10/01/2015   MICROALBUR 11.8* 10/01/2015    No results found.  Assessment & Plan:   Problem List Items Addressed This Visit    Uncontrolled type 2 diabetes mellitus with peripheral circulatory disorder (HCC)    Improved control,  recent hypoglycemic event occurred because of the administration of the mixed insulin doses were too close together.       Relevant Medications   lisinopril (PRINIVIL,ZESTRIL)  20 MG tablet   Hypertension    Elevated today on 10 mg lisinopril .  Will increase to 20 mg daily      Relevant Medications   lisinopril (PRINIVIL,ZESTRIL) 20 MG tablet   Diabetic peripheral neuropathy associated with type 2 diabetes mellitus (HCC) - Primary   Relevant Medications   lisinopril (PRINIVIL,ZESTRIL) 20 MG tablet   Other Relevant Orders   Comprehensive metabolic panel   Hemoglobin A1c     A total of 25 minutes of face to face time was spent with patient more than half of which was spent in counselling about the above  mentioned conditions  and coordination of care.    I have discontinued Ms. Kerin's amoxicillin-clavulanate and HYDROcodone-homatropine. I have also changed her lisinopril. Additionally, I am having her maintain her Vitamin D3, vitamin C, Biotin, B-D ULTRAFINE III SHORT PEN, aspirin, ACCU-CHEK AVIVA PLUS, digoxin, clopidogrel, TRUEPLUS LANCETS 28G, ALPRAZolam, atorvastatin, LEVEMIR FLEXTOUCH, HUMALOG MIX 75/25 KWIKPEN, and gabapentin.  Meds ordered this encounter  Medications  . lisinopril (PRINIVIL,ZESTRIL) 20 MG tablet    Sig: Take 1 tablet (20 mg total) by mouth daily.    Dispense:  90 tablet    Refill:  1    KEEP ON FILE FOR FUTURE REFILLS  NOTE DOSE CHANGE    Medications Discontinued During This Encounter  Medication Reason  . amoxicillin-clavulanate (AUGMENTIN) 875-125 MG tablet Completed Course  . HYDROcodone-homatropine (HYCODAN) 5-1.5 MG/5ML syrup Completed Course  . lisinopril (PRINIVIL,ZESTRIL) 5 MG tablet Reorder    Follow-up: Return for non fastig labs after June 27 ov  july , follow up diabetes.   [name], MD

## 2015-10-17 NOTE — Progress Notes (Signed)
Pre-visit discussion using our clinic review tool. No additional management support is needed unless otherwise documented below in the visit note.  

## 2015-10-20 NOTE — Assessment & Plan Note (Signed)
Improved control,  recent hypoglycemic event occurred because of the administration of the mixed insulin doses were too close together.

## 2015-10-20 NOTE — Assessment & Plan Note (Signed)
Elevated today on 10 mg lisinopril .  Will increase to 20 mg daily

## 2015-10-23 DIAGNOSIS — M2040 Other hammer toe(s) (acquired), unspecified foot: Secondary | ICD-10-CM | POA: Diagnosis not present

## 2015-10-23 DIAGNOSIS — Q6689 Other  specified congenital deformities of feet: Secondary | ICD-10-CM | POA: Diagnosis not present

## 2015-10-23 DIAGNOSIS — L851 Acquired keratosis [keratoderma] palmaris et plantaris: Secondary | ICD-10-CM | POA: Diagnosis not present

## 2015-10-31 ENCOUNTER — Ambulatory Visit: Payer: Medicare Other

## 2015-11-01 ENCOUNTER — Encounter: Payer: Self-pay | Admitting: Family Medicine

## 2015-11-01 ENCOUNTER — Emergency Department: Payer: Medicare Other

## 2015-11-01 ENCOUNTER — Ambulatory Visit (INDEPENDENT_AMBULATORY_CARE_PROVIDER_SITE_OTHER): Payer: Medicare Other | Admitting: Family Medicine

## 2015-11-01 ENCOUNTER — Ambulatory Visit (INDEPENDENT_AMBULATORY_CARE_PROVIDER_SITE_OTHER)
Admission: RE | Admit: 2015-11-01 | Discharge: 2015-11-01 | Disposition: A | Discharge status: Home / Self Care | Payer: Medicare Other | Source: Ambulatory Visit | Attending: Family Medicine | Admitting: Family Medicine

## 2015-11-01 ENCOUNTER — Inpatient Hospital Stay
Admission: EM | Admit: 2015-11-01 | Discharge: 2015-11-02 | DRG: 392 | Disposition: A | Discharge status: Home / Self Care | Payer: Medicare Other | Attending: Specialist | Admitting: Specialist

## 2015-11-01 ENCOUNTER — Encounter: Payer: Self-pay | Admitting: *Deleted

## 2015-11-01 VITALS — BP 138/62 | HR 95 | Temp 98.1°F | Ht 66.0 in | Wt 160.2 lb

## 2015-11-01 DIAGNOSIS — I2510 Atherosclerotic heart disease of native coronary artery without angina pectoris: Secondary | ICD-10-CM | POA: Diagnosis not present

## 2015-11-01 DIAGNOSIS — R1110 Vomiting, unspecified: Secondary | ICD-10-CM | POA: Diagnosis not present

## 2015-11-01 DIAGNOSIS — Z87891 Personal history of nicotine dependence: Secondary | ICD-10-CM

## 2015-11-01 DIAGNOSIS — Z85528 Personal history of other malignant neoplasm of kidney: Secondary | ICD-10-CM | POA: Diagnosis not present

## 2015-11-01 DIAGNOSIS — E871 Hypo-osmolality and hyponatremia: Secondary | ICD-10-CM | POA: Diagnosis present

## 2015-11-01 DIAGNOSIS — R112 Nausea with vomiting, unspecified: Secondary | ICD-10-CM

## 2015-11-01 DIAGNOSIS — N179 Acute kidney failure, unspecified: Secondary | ICD-10-CM | POA: Diagnosis not present

## 2015-11-01 DIAGNOSIS — I509 Heart failure, unspecified: Secondary | ICD-10-CM | POA: Diagnosis present

## 2015-11-01 DIAGNOSIS — M545 Low back pain, unspecified: Secondary | ICD-10-CM | POA: Insufficient documentation

## 2015-11-01 DIAGNOSIS — K529 Noninfective gastroenteritis and colitis, unspecified: Secondary | ICD-10-CM | POA: Diagnosis present

## 2015-11-01 DIAGNOSIS — E1151 Type 2 diabetes mellitus with diabetic peripheral angiopathy without gangrene: Secondary | ICD-10-CM | POA: Diagnosis present

## 2015-11-01 DIAGNOSIS — Z801 Family history of malignant neoplasm of trachea, bronchus and lung: Secondary | ICD-10-CM | POA: Diagnosis not present

## 2015-11-01 DIAGNOSIS — E785 Hyperlipidemia, unspecified: Secondary | ICD-10-CM | POA: Diagnosis not present

## 2015-11-01 DIAGNOSIS — E860 Dehydration: Secondary | ICD-10-CM | POA: Diagnosis present

## 2015-11-01 DIAGNOSIS — R300 Dysuria: Secondary | ICD-10-CM | POA: Diagnosis not present

## 2015-11-01 DIAGNOSIS — N289 Disorder of kidney and ureter, unspecified: Secondary | ICD-10-CM | POA: Diagnosis not present

## 2015-11-01 DIAGNOSIS — Z7982 Long term (current) use of aspirin: Secondary | ICD-10-CM

## 2015-11-01 DIAGNOSIS — E1010 Type 1 diabetes mellitus with ketoacidosis without coma: Secondary | ICD-10-CM | POA: Diagnosis not present

## 2015-11-01 DIAGNOSIS — A084 Viral intestinal infection, unspecified: Secondary | ICD-10-CM | POA: Diagnosis present

## 2015-11-01 DIAGNOSIS — F419 Anxiety disorder, unspecified: Secondary | ICD-10-CM | POA: Diagnosis present

## 2015-11-01 DIAGNOSIS — Z794 Long term (current) use of insulin: Secondary | ICD-10-CM

## 2015-11-01 DIAGNOSIS — Z7902 Long term (current) use of antithrombotics/antiplatelets: Secondary | ICD-10-CM | POA: Diagnosis not present

## 2015-11-01 DIAGNOSIS — M549 Dorsalgia, unspecified: Secondary | ICD-10-CM | POA: Diagnosis not present

## 2015-11-01 DIAGNOSIS — Z951 Presence of aortocoronary bypass graft: Secondary | ICD-10-CM

## 2015-11-01 DIAGNOSIS — E1310 Other specified diabetes mellitus with ketoacidosis without coma: Secondary | ICD-10-CM | POA: Diagnosis present

## 2015-11-01 DIAGNOSIS — I110 Hypertensive heart disease with heart failure: Secondary | ICD-10-CM | POA: Diagnosis not present

## 2015-11-01 LAB — CBC
HCT: 40.3 % (ref 35.0–47.0)
HEMATOCRIT: 40.1 % (ref 36.0–46.0)
HEMOGLOBIN: 13.5 g/dL (ref 12.0–15.0)
Hemoglobin: 13.5 g/dL (ref 12.0–16.0)
MCH: 28.7 pg (ref 26.0–34.0)
MCHC: 33.7 g/dL (ref 30.0–36.0)
MCHC: 33.6 g/dL (ref 32.0–36.0)
MCV: 85.4 fl (ref 78.0–100.0)
MCV: 85.7 fL (ref 80.0–100.0)
PLATELETS: 305 10*3/uL (ref 150.0–400.0)
PLATELETS: 298 10*3/uL (ref 150–440)
RBC: 4.7 Mil/uL (ref 3.87–5.11)
RBC: 4.71 MIL/uL (ref 3.80–5.20)
RDW: 13.3 % (ref 11.5–15.5)
RDW: 13.3 % (ref 11.5–14.5)
WBC: 13.5 10*3/uL — AB (ref 4.0–10.5)
WBC: 13.5 10*3/uL — AB (ref 3.6–11.0)

## 2015-11-01 LAB — COMPREHENSIVE METABOLIC PANEL
ALK PHOS: 78 U/L (ref 39–117)
ALK PHOS: 79 U/L (ref 38–126)
ALT: 16 U/L (ref 0–35)
ALT: 18 U/L (ref 14–54)
ANION GAP: 15 (ref 5–15)
AST: 11 U/L (ref 0–37)
AST: 15 U/L (ref 15–41)
Albumin: 4.1 g/dL (ref 3.5–5.2)
Albumin: 4.1 g/dL (ref 3.5–5.0)
BUN: 40 mg/dL — ABNORMAL HIGH (ref 6–23)
BUN: 46 mg/dL — ABNORMAL HIGH (ref 6–20)
CALCIUM: 9.4 mg/dL (ref 8.4–10.5)
CALCIUM: 9.1 mg/dL (ref 8.9–10.3)
CHLORIDE: 98 mmol/L — AB (ref 101–111)
CO2: 25 mEq/L (ref 19–32)
CO2: 18 mmol/L — ABNORMAL LOW (ref 22–32)
CREATININE: 1.51 mg/dL — AB (ref 0.44–1.00)
Chloride: 94 mEq/L — ABNORMAL LOW (ref 96–112)
Creatinine, Ser: 1.25 mg/dL — ABNORMAL HIGH (ref 0.40–1.20)
GFR, EST AFRICAN AMERICAN: 43 mL/min — AB (ref >60)
GFR, EST NON AFRICAN AMERICAN: 37 mL/min — AB (ref >60)
GFR: 46.93 mL/min — AB (ref >60.00)
GLUCOSE: 480 mg/dL — AB (ref 70–99)
Glucose, Bld: 437 mg/dL — ABNORMAL HIGH (ref 65–99)
POTASSIUM: 5.2 meq/L — AB (ref 3.5–5.1)
Potassium: 4.9 mmol/L (ref 3.5–5.1)
Sodium: 130 mEq/L — ABNORMAL LOW (ref 135–145)
Sodium: 131 mmol/L — ABNORMAL LOW (ref 135–145)
TOTAL PROTEIN: 7.4 g/dL (ref 6.0–8.3)
Total Bilirubin: 0.5 mg/dL (ref 0.2–1.2)
Total Bilirubin: 0.7 mg/dL (ref 0.3–1.2)
Total Protein: 7.4 g/dL (ref 6.5–8.1)

## 2015-11-01 LAB — POCT URINALYSIS DIPSTICK
Ketones, UA: 40
NITRITE UA: NEGATIVE
PH UA: 6
SPEC GRAV UA: 1.015
UROBILINOGEN UA: 0.2

## 2015-11-01 LAB — BLOOD GAS, ARTERIAL
Acid-base deficit: 4.9 mmol/L — ABNORMAL HIGH (ref 0.0–2.0)
Allens test (pass/fail): POSITIVE — AB
Bicarbonate: 19.8 mEq/L — ABNORMAL LOW (ref 21.0–28.0)
FIO2: 0.21
O2 Saturation: 97.8 %
PH ART: 7.36 (ref 7.350–7.450)
Patient temperature: 37
pCO2 arterial: 35 mmHg (ref 32.0–48.0)
pO2, Arterial: 104 mmHg (ref 83.0–108.0)

## 2015-11-01 LAB — GLUCOSE, CAPILLARY
GLUCOSE-CAPILLARY: 421 mg/dL — AB (ref 65–99)
GLUCOSE-CAPILLARY: 236 mg/dL — AB (ref 65–99)
GLUCOSE-CAPILLARY: 250 mg/dL — AB (ref 65–99)

## 2015-11-01 LAB — TROPONIN I

## 2015-11-01 LAB — LIPASE, BLOOD: LIPASE: 265 U/L — AB (ref 11–51)

## 2015-11-01 LAB — LIPASE: LIPASE: 307 U/L — AB (ref 11.0–59.0)

## 2015-11-01 MED ORDER — SODIUM CHLORIDE 0.9 % IV BOLUS (SEPSIS)
1000.0000 mL | Freq: Once | INTRAVENOUS | Status: AC
  Administered 2015-11-01: 1000 mL via INTRAVENOUS

## 2015-11-01 MED ORDER — INSULIN DETEMIR 100 UNIT/ML ~~LOC~~ SOLN
40.0000 [IU] | Freq: Every day | SUBCUTANEOUS | Status: DC
  Administered 2015-11-01: 40 [IU] via SUBCUTANEOUS
  Filled 2015-11-01 (×2): qty 0.4

## 2015-11-01 MED ORDER — TRAMADOL HCL 50 MG PO TABS: 50.0000 mg | ORAL_TABLET | Freq: Four times a day (QID) | ORAL | Status: DC | PRN

## 2015-11-01 MED ORDER — ACETAMINOPHEN 650 MG RE SUPP: 650.0000 mg | Freq: Four times a day (QID) | RECTAL | Status: DC | PRN

## 2015-11-01 MED ORDER — ONDANSETRON HCL 4 MG PO TABS: 4.0000 mg | ORAL_TABLET | Freq: Three times a day (TID) | ORAL | Status: DC | PRN

## 2015-11-01 MED ORDER — ENOXAPARIN SODIUM 40 MG/0.4ML ~~LOC~~ SOLN
40.0000 mg | SUBCUTANEOUS | Status: DC
  Administered 2015-11-01: 40 mg via SUBCUTANEOUS
  Filled 2015-11-01: qty 0.4

## 2015-11-01 MED ORDER — SODIUM CHLORIDE 0.9 % IV SOLN
INTRAVENOUS | Status: DC
  Administered 2015-11-01 – 2015-11-02 (×2): via INTRAVENOUS

## 2015-11-01 MED ORDER — ACETAMINOPHEN 325 MG PO TABS: 650.0000 mg | ORAL_TABLET | Freq: Four times a day (QID) | ORAL | Status: DC | PRN

## 2015-11-01 MED ORDER — ASPIRIN 81 MG PO CHEW
81.0000 mg | CHEWABLE_TABLET | Freq: Every day | ORAL | Status: DC
  Administered 2015-11-01: 81 mg via ORAL
  Filled 2015-11-01 (×2): qty 1

## 2015-11-01 MED ORDER — ALPRAZOLAM 0.5 MG PO TABS: 0.5000 mg | ORAL_TABLET | Freq: Three times a day (TID) | ORAL | Status: DC | PRN

## 2015-11-01 MED ORDER — ONDANSETRON HCL 4 MG PO TABS: 4.0000 mg | ORAL_TABLET | Freq: Four times a day (QID) | ORAL | Status: DC | PRN

## 2015-11-01 MED ORDER — INSULIN ASPART 100 UNIT/ML ~~LOC~~ SOLN
0.0000 [IU] | Freq: Three times a day (TID) | SUBCUTANEOUS | Status: DC
  Administered 2015-11-02: 5 [IU] via SUBCUTANEOUS
  Filled 2015-11-01: qty 5

## 2015-11-01 MED ORDER — ONDANSETRON HCL 4 MG/2ML IJ SOLN: 4.0000 mg | Freq: Four times a day (QID) | INTRAMUSCULAR | Status: DC | PRN

## 2015-11-01 MED ORDER — CLOPIDOGREL BISULFATE 75 MG PO TABS
75.0000 mg | ORAL_TABLET | Freq: Every day | ORAL | Status: DC
  Administered 2015-11-01: 75 mg via ORAL
  Filled 2015-11-01 (×2): qty 1

## 2015-11-01 MED ORDER — GABAPENTIN 600 MG PO TABS
600.0000 mg | ORAL_TABLET | Freq: Three times a day (TID) | ORAL | Status: DC
Start: 2015-11-01 — End: 2015-11-02
  Administered 2015-11-01 – 2015-11-02 (×2): 600 mg via ORAL
  Filled 2015-11-01 (×2): qty 1

## 2015-11-01 MED ORDER — ATORVASTATIN CALCIUM 20 MG PO TABS
20.0000 mg | ORAL_TABLET | Freq: Every day | ORAL | Status: DC
  Administered 2015-11-01: 20 mg via ORAL
  Filled 2015-11-01 (×2): qty 1

## 2015-11-01 MED ORDER — TRAMADOL HCL 50 MG PO TABS: 50.0000 mg | ORAL_TABLET | Freq: Three times a day (TID) | ORAL | Status: DC | PRN

## 2015-11-01 MED ORDER — SODIUM CHLORIDE 0.9 % IV SOLN
Freq: Once | INTRAVENOUS | Status: AC
  Administered 2015-11-01: 16:00:00 via INTRAVENOUS

## 2015-11-01 MED ORDER — INSULIN ASPART PROT & ASPART (70-30 MIX) 100 UNIT/ML ~~LOC~~ SUSP: 25.0000 [IU] | Freq: Two times a day (BID) | SUBCUTANEOUS | Status: DC

## 2015-11-01 MED ORDER — INSULIN ASPART 100 UNIT/ML ~~LOC~~ SOLN
6.0000 [IU] | Freq: Once | SUBCUTANEOUS | Status: AC
  Administered 2015-11-01: 6 [IU] via INTRAVENOUS
  Filled 2015-11-01: qty 6

## 2015-11-01 NOTE — ED Notes (Signed)
Patient transported to X-ray 

## 2015-11-01 NOTE — Progress Notes (Signed)
Subjective:  Patient ID: Laura Reed, female    DOB: 06/16/1961  Age: 55 y.o. MRN: [id number]  CC: Back pain, Nausea/vomiting  HPI:  55 year old female with a complicated past medical history including uncontrolled type 2 diabetes, peripheral vessel disease, renal cell carcinoma status post nephrectomy ([place]), hypertension, hyperlipidemia presents with the above complaints.  Back pain  1 week. Started after gardening.  Located in the left low back and radiates anteriorly to the flank.  Pain is constant.  Improves with activity.  No known exacerbating factors.  She's been taking Tylenol for the pain.  No associated urinary symptoms although this is her primary concern is she has one kidney.  She has been experiencing some nausea and vomiting. See separate problem.  Nausea/vomiting  Patient states that she has been expansion nausea or vomiting since Sunday. She states that every time she eats a meal within 15-30 minutes she vomits.  No associated fever.  She states that she has irregular bowel movements. Last value was 2 days ago.  She was able to keep down some coffee this morning.  She does have extensive surgical history particularly of the abdomen.  No known inciting, exacerbating, or relieving factors.  Social Hx   Social History   Social History  . Marital Status: Married    Spouse Name: N/A  . Number of Children: N/A  . Years of Education: N/A   Social History Main Topics  . Smoking status: Former Smoker    Types: Cigarettes    Quit date: 03/30/2011  . Smokeless tobacco: Never Used  . Alcohol Use: No  . Drug Use: No  . Sexual Activity: Not Currently   Other Topics Concern  . None   Social History Narrative   Review of Systems  Constitutional: Positive for fatigue. Negative for fever.  Gastrointestinal: Positive for nausea and vomiting.  Genitourinary: Positive for flank pain.  Musculoskeletal: Positive for back pain.   Objective:  BP  138/62 mmHg  Pulse 95  Temp(Src) 98.1 F (36.7 C) (Oral)  Ht '5\' 6"'$  (1.676 m)  Wt 160 lb 4 oz (72.689 kg)  BMI 25.88 kg/m2  SpO2 96%  BP/Weight 11/01/2015 10/17/2015 8/88/9169  Systolic BP 450 388 828  Diastolic BP 62 70 65  Wt. (Lbs) 160.25 173.25 171  BMI 25.88 27.98 27.61   Physical Exam  Constitutional: She is oriented to person, place, and time. She appears well-developed. No distress.  HENT:  Head: Normocephalic and atraumatic.  Eyes: Conjunctivae are normal.  Cardiovascular: Normal rate and regular rhythm.   2/6 systolic murmur.  Pulmonary/Chest: Effort normal.  Abdominal: Soft. She exhibits no distension. There is no tenderness. There is no rebound and no guarding.  No bowel sounds auscultated.  Musculoskeletal:  Low back - left paraspinal region to palpation. No CVA tenderness.  Neurological: She is alert and oriented to person, place, and time.  Psychiatric: She has a normal mood and affect.  Vitals reviewed.  Lab Results  Component Value Date   WBC 13.4* 05/18/2015   HGB 12.7 05/18/2015   HCT 38.5 05/18/2015   PLT 282.0 05/18/2015   GLUCOSE 56* 10/01/2015   CHOL 110 10/01/2015   TRIG 125.0 10/01/2015   HDL 36.90* 10/01/2015   LDLDIRECT 58.0 07/31/2015   LDLCALC 48 10/01/2015   ALT 20 10/01/2015   AST 17 10/01/2015   NA 138 10/01/2015   K 4.6 10/01/2015   CL 103 10/01/2015   CREATININE 0.97 10/01/2015   BUN 32* 10/01/2015  CO2 29 10/01/2015   TSH 0.96 05/18/2015   INR 0.9 10/17/2014   HGBA1C 7.8* 10/01/2015   MICROALBUR 11.8* 10/01/2015   Results for orders placed or performed in visit on 11/01/15 (from the past 24 hour(s))  POCT Urinalysis Dipstick     Status: Abnormal   Collection Time: 11/01/15  8:36 AM  Result Value Ref Range   Color, UA yellow    Clarity, UA cloudy    Glucose, UA >=1000 mg/dL    Bilirubin, UA small    Ketones, UA 40 mg/dL    Spec Grav, UA 1.015    Blood, UA small    pH, UA 6.0    Protein, UA 30 mg/dL    Urobilinogen,  UA 0.2    Nitrite, UA neg    Leukocytes, UA small (1+) (A) Negative   Assessment & Plan:   Problem List Items Addressed This Visit    Left-sided low back pain without sciatica - Primary    New problem. Appears to be MSK in origin. Unlikely to be pyelonephritis as patient is status post left nephrectomy. Sending urine for culture. Treating PRN Tramadol.      Relevant Medications   traMADol (ULTRAM) 50 MG tablet   Nausea and vomiting    New problem. Unclear etiology. Abdominal exam notable for lack of bowel sounds. Otherwise normal. Does not appear to be distended. Sending for KUB today. Patient has an upcoming CT tomorrow. Zofran as needed for nausea. Patient to go to the ER if she worsens.      Relevant Orders   CBC   Comp Met (CMET)   Lipase   POCT Urinalysis Dipstick (Completed)   DG Abd 1 View   Urine Culture     Meds ordered this encounter  Medications  . traMADol (ULTRAM) 50 MG tablet    Sig: Take 1 tablet (50 mg total) by mouth every 8 (eight) hours as needed.    Dispense:  30 tablet    Refill:  0  . ondansetron (ZOFRAN) 4 MG tablet    Sig: Take 1 tablet (4 mg total) by mouth every 8 (eight) hours as needed for nausea or vomiting.    Dispense:  20 tablet    Refill:  0   Follow-up: PRN; If worsens, she is to go to the ED  [place]

## 2015-11-01 NOTE — Progress Notes (Signed)
Pre visit review using our clinic review tool, if applicable. No additional management support is needed unless otherwise documented below in the visit note. 

## 2015-11-01 NOTE — Assessment & Plan Note (Signed)
New problem. Appears to be MSK in origin. Unlikely to be pyelonephritis as patient is status post left nephrectomy. Sending urine for culture. Treating PRN Tramadol.

## 2015-11-01 NOTE — Patient Instructions (Signed)
Back pain is likely MSK. We will send urine culture.  We will call with the xray of your abdomen.  If you are unable to keep things down and you worsen, go to the ED.  Use the zofran as needed.  Take care  Dr. Lacinda Axon

## 2015-11-01 NOTE — ED Provider Notes (Signed)
Time Seen: Approximately 1640  I have reviewed the triage notes  Chief Complaint: Abnormal Lab   History of Present Illness: Laura Reed is a 55 y.o. female who presents with recent onset of some nausea and vomiting without diarrhea. She denies much in way of chest or abdominal pain. She denies any hematemesis or obvious fever at home. Patient was treated on an outpatient basis by her primary physician with [facility] states the nausea is improved. Patient has a history of type 1 diabetes and states that she did not take her insulin over the weekend and throughout the week while the nausea and vomiting was occurring because she was afraid she would bottom out her blood sugar. Patient states that she has no episodes of shortness of breath or productive cough or abnormal skin lesions. She states she has been able to keep some fluids down recently. Patient was referred here to the emergency department due to some abnormal lab work that was performed earlier today. Review laboratory work does show ketones in her urine with some renal insufficiency.   Past Medical History  Diagnosis Date  . Diabetes mellitus   . Hypertension   . Hemorrhoid   . PVD (peripheral vascular disease) ([place])   . Heart murmur   . CHF (congestive heart failure) ([place])   . Coronary artery disease   . Cancer ([place])   . Urothelial carcinoma of kidney ([place]) 10/31/2014    INVASIVE UROTHELIAL CARCINOMA, LOW GRADE. T1, Nx.    Patient Active Problem List   Diagnosis Date Noted  . Left-sided low back pain without sciatica 11/01/2015  . Nausea and vomiting 11/01/2015  . Encounter for preventive health examination 08/02/2015  . Solitary kidney 05/15/2015  . Diabetic nephropathy with proteinuria ([place]) 01/27/2015  . Overweight (BMI 25.0-29.9) 10/07/2014  . Renal cancer ([place]) 09/28/2014  . Peripheral vascular disease ([place]) 08/24/2014  . Coronary artery abnormality 08/24/2014  . Multiple pulmonary nodules determined by  computed tomography of lung 08/19/2014  . Hernia of abdominal cavity 08/10/2014  . Hyperlipidemia associated with type 2 diabetes mellitus ([place]) 06/17/2014  . S/P carotid endarterectomy 01/22/2014  . Arrhythmia 11/14/2013  . History of shingles 05/18/2013  . Preoperative evaluation of a medical condition to rule out surgical contraindications (TAR required) 04/06/2013  . Diabetic peripheral neuropathy associated with type 2 diabetes mellitus ([place]) 04/06/2013  . Chronic hip pain 01/16/2013  . Routine general medical examination at a health care facility 06/26/2012  . PVD (peripheral vascular disease) ([place])   . History of tobacco abuse 04/29/2011  . Uncontrolled type 2 diabetes mellitus with peripheral circulatory disorder ([place])   . Hypertension   . Hemorrhoid     Past Surgical History  Procedure Laterality Date  . Coronary artery bypass graft  2009    3 vessel  . Arterial bypass surgry  2009, 2013 x 2    right leg , done in [place]  . Cesarean section    . Carotid endarterectomy  July 2015    Dr Delana Meyer  . Cholecystectomy  03-03-12    Porcelain gallbladder, gallstones,  [place]nett  . Colonoscopy w/ biopsies  04/28/2012    Hyperplastic rectal polyps.  . Nephrectomy Left 10-31-14  . Hernia repair  10-31-14    ventral, retro-rectus atrium mesh  . Amputation toe    . Cataract extraction w/phaco Right 12/14/2014    Procedure: CATARACT EXTRACTION PHACO AND INTRAOCULAR LENS PLACEMENT (IOC);  Surgeon: Lyla Glassing, MD;  Location: [facility];  Service: Ophthalmology;  Laterality: Right;  [place]   00:38.6              AP        7.1                   CDE  2.76  . Peripheral vascular catheterization Left 05/01/2015    Procedure: Lower Extremity Angiography;  Surgeon: Katha Cabal, MD;  Location: [place] [facility];  Service: Cardiovascular;  Laterality: Left;  . Peripheral vascular catheterization  05/01/2015    Procedure: Lower Extremity Intervention;  Surgeon: Katha Cabal, MD;   Location: [facility];  Service: Cardiovascular;;  . Peripheral vascular catheterization Left 02/20/2015    Procedure: Pelvic Angiography;  Surgeon: Katha Cabal, MD;  Location: [place] [facility];  Service: Cardiovascular;  Laterality: Left;    Past Surgical History  Procedure Laterality Date  . Coronary artery bypass graft  2009    3 vessel  . Arterial bypass surgry  2009, 2013 x 2    right leg , done in [place]  . Cesarean section    . Carotid endarterectomy  July 2015    Dr Delana Meyer  . Cholecystectomy  03-03-12    Porcelain gallbladder, gallstones,  [place]rnett  . Colonoscopy w/ biopsies  04/28/2012    Hyperplastic rectal polyps.  . Nephrectomy Left 10-31-14  . Hernia repair  10-31-14    ventral, retro-rectus atrium mesh  . Amputation toe    . Cataract extraction w/phaco Right 12/14/2014    Procedure: CATARACT EXTRACTION PHACO AND INTRAOCULAR LENS PLACEMENT (IOC);  Surgeon: Lyla Glassing, MD;  Location: [facility];  Service: Ophthalmology;  Laterality: Right;  [place]   00:38.6              AP        7.1                   CDE  2.76  . Peripheral vascular catheterization Left 05/01/2015    Procedure: Lower Extremity Angiography;  Surgeon: Katha Cabal, MD;  Location: [place];  Service: Cardiovascular;  Laterality: Left;  . Peripheral vascular catheterization  05/01/2015    Procedure: Lower Extremity Intervention;  Surgeon: Katha Cabal, MD;  Location: [place] [facility];  Service: Cardiovascular;;  . Peripheral vascular catheterization Left 02/20/2015    Procedure: Pelvic Angiography;  Surgeon: Katha Cabal, MD;  Location: [place];  Service: Cardiovascular;  Laterality: Left;    Current Outpatient Rx  Name  Route  Sig  Dispense  Refill  . ACCU-CHEK AVIVA PLUS test strip      CHECK  BLOOD  SUGAR    TWO TO  THREE  TIMES  A  DAY      6     Dispense as written.   Marland Kitchen ALPRAZolam (XANAX) 0.5 MG tablet      TAKE 1 TABLET BY MOUTH THREE  TIMES DAILY AS NEEDED   90 tablet   1   . aspirin 81 MG chewable tablet   Oral   Chew 81 mg by mouth daily.         Marland Kitchen atorvastatin (LIPITOR) 20 MG tablet      TAKE 1 TABLET BY MOUTH EVERY DAY   90 tablet   0   . B-D ULTRAFINE III SHORT PEN 31G X 8 MM MISC      TEST BLOOD SUGAR TWICE DAILY   100 each   5   .  Biotin 1000 MCG tablet   Oral   Take 1,000 mcg by mouth daily.          . Cholecalciferol (VITAMIN D3) 2000 UNITS TABS   Oral   Take 1 tablet by mouth daily.          . clopidogrel (PLAVIX) 75 MG tablet      TAKE 1 TABLET BY MOUTH EVERY DAY   90 tablet   1   . digoxin (LANOXIN) 0.125 MG tablet      TAKE 1 TABLET BY MOUTH EVERY DAY   90 tablet   0   . gabapentin (NEURONTIN) 600 MG tablet      TAKE 1 TABLET BY MOUTH THREE TIMES DAILY   270 tablet   1   . HUMALOG MIX 75/25 KWIKPEN (75-25) 100 UNIT/ML Kwikpen      INJECT 30 UNITS UNDER THE SKIN EVERY MORNING AND 36 UNITS EVERY EVENING   30 mL   0   . LEVEMIR FLEXTOUCH 100 UNIT/ML Pen      INJECT 40 UNITS UNDER THE SKIN EVERY DAY AT 10 PM   15 mL   3   . lisinopril (PRINIVIL,ZESTRIL) 20 MG tablet   Oral   Take 1 tablet (20 mg total) by mouth daily.   90 tablet   1     KEEP ON FILE FOR FUTURE REFILLS  NOTE DOSE CHANGE   . ondansetron (ZOFRAN) 4 MG tablet   Oral   Take 1 tablet (4 mg total) by mouth every 8 (eight) hours as needed for nausea or vomiting.   20 tablet   0   . traMADol (ULTRAM) 50 MG tablet   Oral   Take 1 tablet (50 mg total) by mouth every 8 (eight) hours as needed.   30 tablet   0   . TRUEPLUS LANCETS 28G MISC      CHECK BLOOD SUGAR FOUR TIMES DAILY   200 each   2   . vitamin C (ASCORBIC ACID) 500 MG tablet   Oral   Take 1,000 mg by mouth 2 (two) times daily.           Allergies:  Review of patient's allergies indicates no known allergies.  Family History: Family History  Problem Relation Age of Onset  . Cancer Mother 1    Lung Cancer  . Cancer  Father 23    Lung Ca  . Breast cancer Neg Hx     Social History: Social History  Substance Use Topics  . Smoking status: Former Smoker    Types: Cigarettes    Quit date: 03/30/2011  . Smokeless tobacco: Never Used  . Alcohol Use: No     Review of Systems:   10 point review of systems was performed and was otherwise negative:  Constitutional: No fever Eyes: No visual disturbances ENT: No sore throat, ear pain Cardiac: No chest pain Respiratory: No shortness of breath, wheezing, or stridor Abdomen: No abdominal pain, no vomiting, No diarrhea Endocrine: No weight loss, No night sweats Extremities: No peripheral edema, cyanosis Skin: No rashes, easy bruising Neurologic: No focal weakness, trouble with speech or swollowing Urologic: No dysuria, Hematuria, or urinary frequency   Physical Exam:  ED Triage Vitals  Enc Vitals Group     BP 11/01/15 1548 116/83 mmHg     Pulse Rate 11/01/15 1548 99     Resp 11/01/15 1548 18     Temp 11/01/15 1548 97.9 F (36.6 C)  Temp Source 11/01/15 1548 Oral     SpO2 11/01/15 1548 96 %     Weight 11/01/15 1548 160 lb (72.576 kg)     Height 11/01/15 1548 5\' 6"  (1.676 m)     Head Cir --      Peak Flow --      Pain Score 11/01/15 1556 0     Pain Loc --      Pain Edu? --      Excl. in [place]? --     General: Awake , Alert , and Oriented times 3; GCS 15 Head: Normal cephalic , atraumatic Eyes: Pupils equal , round, reactive to light Nose/Throat: No nasal drainage, patent upper airway without erythema or exudate.  Neck: Supple, Full range of motion, No anterior adenopathy or palpable thyroid masses Lungs: Clear to ascultation without wheezes , rhonchi, or rales Heart: Regular rate, regular rhythm without murmurs , gallops , or rubs Abdomen: Soft, non tender without rebound, guarding , or rigidity; bowel sounds positive and symmetric in all 4 quadrants. No organomegaly .        Extremities: 2 plus symmetric pulses. No edema, clubbing or  cyanosis Neurologic: normal ambulation, Motor symmetric without deficits, sensory intact Skin: warm, dry, no rashes   Labs:   All laboratory work was reviewed including any pertinent negatives or positives listed below:  Labs Reviewed  GLUCOSE, CAPILLARY - Abnormal; Notable for the following:    Glucose-Capillary 421 (*)    All other components within normal limits  LIPASE, BLOOD - Abnormal; Notable for the following:    Lipase 265 (*)    All other components within normal limits  COMPREHENSIVE METABOLIC PANEL - Abnormal; Notable for the following:    Sodium 131 (*)    Chloride 98 (*)    CO2 18 (*)    Glucose, Bld 437 (*)    BUN 46 (*)    Creatinine, Ser 1.51 (*)    GFR calc non Af Amer 37 (*)    GFR calc Af Amer 43 (*)    All other components within normal limits  CBC - Abnormal; Notable for the following:    WBC 13.5 (*)    All other components within normal limits  BLOOD GAS, ARTERIAL - Abnormal; Notable for the following:    Bicarbonate 19.8 (*)    Acid-base deficit 4.9 (*)    Allens test (pass/fail) POSITIVE (*)    All other components within normal limits  TROPONIN I  Patient has hyperglycemia with some mildly elevated lipase . The patient also has some renal insufficiency with an elevating BUN and creatinine.  EKG: * ED ECG REPORT I, Daymon Larsen, the attending physician, personally viewed and interpreted this ECG.  Date: 11/01/2015 EKG Time: 1720 Rate: 89 Rhythm: normal sinus rhythm with occasional PVC QRS Axis: normal Intervals: Mild right bundle-branch block ST/T Wave abnormalities: normal Conduction Disturbances: none Narrative Interpretation: unremarkable No acute ischemic changes   Narrative:   CLINICAL DATA: Patient with back pain and vomiting for 1 week.  EXAM: CHEST 2 VIEW  COMPARISON: Chest radiograph 08/15/2013.  FINDINGS: Normal cardiac and mediastinal contours status post median sternotomy. No consolidative pulmonary opacities. No  pleural effusion or pneumothorax. Mid thoracic spine degenerative changes.  IMPRESSION: No acute cardiopulmonary process.      Radiology  I personally reviewed the radiologic studies    Critical Care:  CRITICAL CARE Performed by: Daymon Larsen   Total critical care time: 33 minutes  Critical care time  was exclusive of separately billable procedures and treating other patients.  Critical care was necessary to treat or prevent imminent or life-threatening deterioration.  Critical care was time spent personally by me on the following activities: development of treatment plan with patient and/or surrogate as well as nursing, discussions with consultants, evaluation of patient's response to treatment, examination of patient, obtaining history from patient or surrogate, ordering and performing treatments and interventions, ordering and review of laboratory studies, ordering and review of radiographic studies, pulse oximetry and re-evaluation of patient's condition. Initial evaluation and treatment for mild diabetic ketoacidosis with a non anion gap    ED Course:  Patient's stay was uneventful and she was initiated on fluid resuscitation along with IV insulin. Workup was initiated for hyperglycemia including EKG and troponin which is still pending. Lipase is slightly elevated though she does not present with any signs of abdominal pain at this time.    Assessment: * Mild diabetic ketoacidosis Renal insufficiency   Final Clinical Impression:  Final diagnoses:  Diabetic ketoacidosis without coma associated with type 1 diabetes mellitus [facility])     Plan:  Inpatient management            Daymon Larsen, MD 11/01/15 [phone]

## 2015-11-01 NOTE — H&P (Signed)
[place] [facility] [place] NAME: Laura Reed    MR#:  [id number]  DATE OF BIRTH:  Nov 27, 1960  DATE OF ADMISSION:  11/01/2015  PRIMARY CARE PHYSICIAN: [name], MD   REQUESTING/REFERRING PHYSICIAN: Dr. Marcelene Butte  CHIEF COMPLAINT:   Nausea vomiting and poor by mouth intake for 3 days HISTORY OF PRESENT ILLNESS:  Laura Reed  is a 55 y.o. female with a known history of diabetes, hypertension, history of CHF considered emergency room with nausea vomiting and diarrhea for last 3 days. Patient's A  diarrhea resolved however she continued to have nausea vomiting until this morning was dry heaving unable to keep anything orally. She was found to be dehydrated. She did not take her insulin from last day and a half thinking her sugars were bottom out because she was unable to keep anything orally. She was found to have a limited sugars in the 400s. Her anion gap was within normal limits. Patient is being admitted with acute renal failure secondary to acute gastroenteritis with poorly controlled type 2 diabetes and significant hyponatremia which is due to acute renal failure.  PAST MEDICAL HISTORY:   Past Medical History  Diagnosis Date  . Diabetes mellitus   . Hypertension   . Hemorrhoid   . PVD (peripheral vascular disease) ([place])   . Heart murmur   . CHF (congestive heart failure) ([place])   . Coronary artery disease   . Cancer ([place])   . Urothelial carcinoma of kidney ([place]) 10/31/2014    INVASIVE UROTHELIAL CARCINOMA, LOW GRADE. T1, Nx.    PAST SURGICAL HISTOIRY:   Past Surgical History  Procedure Laterality Date  . Coronary artery bypass graft  2009    3 vessel  . Arterial bypass surgry  2009, 2013 x 2    right leg , done in [place]  . Cesarean section    . Carotid endarterectomy  July 2015    Dr Delana Meyer  . Cholecystectomy  03-03-12    Porcelain gallbladder, gallstones,  [place]rnett  . Colonoscopy w/ biopsies  04/28/2012     Hyperplastic rectal polyps.  . Nephrectomy Left 10-31-14  . Hernia repair  10-31-14    ventral, retro-rectus atrium mesh  . Amputation toe    . Cataract extraction w/phaco Right 12/14/2014    Procedure: CATARACT EXTRACTION PHACO AND INTRAOCULAR LENS PLACEMENT (IOC);  Surgeon: Lyla Glassing, MD;  Location: [facility];  Service: Ophthalmology;  Laterality: Right;  [place]   00:38.6              AP        7.1                   CDE  2.76  . Peripheral vascular catheterization Left 05/01/2015    Procedure: Lower Extremity Angiography;  Surgeon: Katha Cabal, MD;  Location: [place] [facility];  Service: Cardiovascular;  Laterality: Left;  . Peripheral vascular catheterization  05/01/2015    Procedure: Lower Extremity Intervention;  Surgeon: Katha Cabal, MD;  Location: [place] [facility];  Service: Cardiovascular;;  . Peripheral vascular catheterization Left 02/20/2015    Procedure: Pelvic Angiography;  Surgeon: Katha Cabal, MD;  Location: [place] [facility];  Service: Cardiovascular;  Laterality: Left;    SOCIAL HISTORY:   Social History  Substance Use Topics  . Smoking status: Former Smoker    Types: Cigarettes    Quit date: 03/30/2011  . Smokeless tobacco: Never Used  .  Alcohol Use: No    FAMILY HISTORY:   Family History  Problem Relation Age of Onset  . Cancer Mother 8    Lung Cancer  . Cancer Father 69    Lung Ca  . Breast cancer Neg Hx     DRUG ALLERGIES:  No Known Allergies  REVIEW OF SYSTEMS:  Review of Systems  Constitutional: Negative for fever, chills and weight loss.  HENT: Negative for ear discharge, ear pain and nosebleeds.   Eyes: Negative for blurred vision, pain and discharge.  Respiratory: Negative for sputum production, shortness of breath, wheezing and stridor.   Cardiovascular: Negative for chest pain, palpitations, orthopnea and PND.  Gastrointestinal: Positive for nausea and vomiting. Negative for abdominal pain and diarrhea.   Genitourinary: Negative for urgency and frequency.  Musculoskeletal: Negative for back pain and joint pain.  Neurological: Positive for weakness. Negative for sensory change, speech change and focal weakness.  Psychiatric/Behavioral: Negative for depression and hallucinations. The patient is not nervous/anxious.   All other systems reviewed and are negative.    MEDICATIONS AT HOME:   Prior to Admission medications   Medication Sig Start Date End Date Taking? Authorizing Provider  ALPRAZolam Duanne Moron) 0.5 MG tablet Take 0.5 mg by mouth 3 (three) times daily as needed for anxiety.   Yes Historical Provider, MD  aspirin 81 MG chewable tablet Chew 81 mg by mouth daily.   Yes Historical Provider, MD  atorvastatin (LIPITOR) 20 MG tablet Take 20 mg by mouth daily.   Yes Historical Provider, MD  Cholecalciferol (VITAMIN D3) 2000 UNITS TABS Take 1 tablet by mouth daily.    Yes Historical Provider, MD  clopidogrel (PLAVIX) 75 MG tablet Take 75 mg by mouth daily.   Yes Historical Provider, MD  digoxin (LANOXIN) 0.125 MG tablet Take 0.125 mg by mouth daily.   Yes Historical Provider, MD  gabapentin (NEURONTIN) 600 MG tablet Take 600 mg by mouth 3 (three) times daily.   Yes Historical Provider, MD  Insulin Detemir (LEVEMIR FLEXTOUCH) 100 UNIT/ML Pen Inject 40 Units into the skin daily at 10 pm.   Yes Historical Provider, MD  insulin lispro protamine-lispro (HUMALOG 75/25 MIX) (75-25) 100 UNIT/ML SUSP injection Inject 30-36 Units into the skin 2 (two) times daily. Inject 30 units sub-q in morning and inject 36 units sub-q in evening   Yes Historical Provider, MD  lisinopril (PRINIVIL,ZESTRIL) 20 MG tablet Take 1 tablet (20 mg total) by mouth daily. 10/17/15  Yes [name], MD  ondansetron (ZOFRAN) 4 MG tablet Take 1 tablet (4 mg total) by mouth every 8 (eight) hours as needed for nausea or vomiting. 11/01/15  Yes Coral Spikes, DO  traMADol (ULTRAM) 50 MG tablet Take 1 tablet (50 mg total) by mouth every 8  (eight) hours as needed. 11/01/15  Yes Coral Spikes, DO      VITAL SIGNS:  Blood pressure 109/66, pulse 86, temperature 97.9 F (36.6 C), temperature source Oral, resp. rate 13, height 5\' 6"  (1.676 m), weight 72.576 kg (160 lb), SpO2 100 %.  PHYSICAL EXAMINATION:  GENERAL:  55 y.o.-year-old patient lying in the bed with no acute distress.  EYES: Pupils equal, round, reactive to light and accommodation. No scleral icterus. Extraocular muscles intact.  HEENT: Head atraumatic, normocephalic. Oropharynx and nasopharynx clear. Oral mucosa dry  NECK:  Supple, no jugular venous distention. No thyroid enlargement, no tenderness.  LUNGS: Normal breath sounds bilaterally, no wheezing, rales,rhonchi or crepitation. No use of accessory muscles of respiration.  CARDIOVASCULAR: S1, S2 normal. No murmurs, rubs, or gallops.  ABDOMEN: Soft, nontender, nondistended. Bowel sounds present. No organomegaly or mass.  EXTREMITIES: No pedal edema, cyanosis, or clubbing.  NEUROLOGIC: Cranial nerves II through XII are intact. Muscle strength 5/5 in all extremities. Sensation intact. Gait not checked.  PSYCHIATRIC: The patient is alert and oriented x 3.  SKIN: No obvious rash, lesion, or ulcer.   LABORATORY PANEL:   CBC  Recent Labs Lab 11/01/15 1559  WBC 13.5*  HGB 13.5  HCT 40.3  PLT 298   ------------------------------------------------------------------------------------------------------------------  Chemistries   Recent Labs Lab 11/01/15 1559  NA 131*  K 4.9  CL 98*  CO2 18*  GLUCOSE 437*  BUN 46*  CREATININE 1.51*  CALCIUM 9.1  AST 15  ALT 18  ALKPHOS 79  BILITOT 0.7   ------------------------------------------------------------------------------------------------------------------  Cardiac Enzymes  Recent Labs Lab 11/01/15 1559  TROPONINI <0.03   ------------------------------------------------------------------------------------------------------------------  RADIOLOGY:   Dg Chest 2 View  11/01/2015  CLINICAL DATA:  Patient with back pain and vomiting for 1 week. EXAM: CHEST  2 VIEW COMPARISON:  Chest radiograph 08/15/2013. FINDINGS: Normal cardiac and mediastinal contours status post median sternotomy. No consolidative pulmonary opacities. No pleural effusion or pneumothorax. Mid thoracic spine degenerative changes. IMPRESSION: No acute cardiopulmonary process. Electronically Signed   By: Lovey Newcomer M.D.   On: 11/01/2015 17:56   Dg Abd 1 View  11/01/2015  CLINICAL DATA:  Vomiting with nausea. EXAM: ABDOMEN - 1 VIEW COMPARISON:  None. FINDINGS: The bowel gas pattern is normal. No radio-opaque calculi or other significant radiographic abnormality are seen. IMPRESSION: Negative. Electronically Signed   By: Kathreen Devoid   On: 11/01/2015 11:24   IMPRESSION AND PLAN:  Laura Reed  is a 55 y.o. female with a known history of diabetes, hypertension, history of CHF considered emergency room with nausea vomiting and diarrhea for last 3 days. Patient's A  diarrhea resolved however she continued to have nausea vomiting until this morning was dry heaving unable to keep anything orally. She was found to be dehydrated  1. Acute gastroenteritis appears viral -Patient presented with intractable nausea vomiting for 3 days. Unable to keep anything orally. -Received IV fluids in the emergency room felt better. She is willing to try something to eat. -As needed Zofran/Phenergan -Diarrhea resolved  2 acute renal failure secondary to GI losses -IV fluids for hydration  3. Acute hyponatremia secondary to GI losses and uncontrolled type 2 diabetes -IV fluids -Subcutaneous insulin -Monitor sodium closely.  4. Poorly controlled type 2 diabetes secondary to #1 and patient not taking her insulin due to nausea vomiting We'll place her on high-dose sliding scale insulin and home dose of insulin Monitor sugars and adjust accordingly Patient will benefit from some diabetic  education  5. DVT prophylaxis subcutaneous Lovenox    All the records are reviewed and case discussed with ED provider. Management plans discussed with the patient, family and they are in agreement.   CODE STATUS: Full  TOTAL TIME TAKING CARE OF THIS PATIENT: 50  minutes.    Laura Reed M.D on 11/01/2015 at 6:39 PM  Between 7am to 6pm - Pager - [phone]  After 6pm go to [email or web address] - password [email or web address] [facility]  Office  [phone]  CC: Primary care physician; [name], MD

## 2015-11-01 NOTE — Assessment & Plan Note (Addendum)
New problem. Unclear etiology. Abdominal exam notable for lack of bowel sounds. Otherwise normal. Does not appear to be distended. Sending for KUB today. Patient has an upcoming CT tomorrow. Zofran as needed for nausea. Patient to go to the ER if she worsens.  ** Addendum - KUB returned negative. Labs returned and was notable for AKI with a creatinine of 1.25, blood sugar of 480, elevated lipase of 307. Sending to ED.

## 2015-11-01 NOTE — ED Notes (Signed)
Pt saw her PCP this AM for abd pain and vomiting and states was sent to ER for evaluation of low NA, high lipase and sugar, pt awake and alert, states hx of DM, states she has not been taking her insulin since she has not been eating

## 2015-11-02 ENCOUNTER — Ambulatory Visit: Admission: RE | Admit: 2015-11-02 | Payer: Medicare Other | Source: Ambulatory Visit

## 2015-11-02 LAB — GLUCOSE, CAPILLARY
GLUCOSE-CAPILLARY: 113 mg/dL — AB (ref 65–99)
Glucose-Capillary: 211 mg/dL — ABNORMAL HIGH (ref 65–99)

## 2015-11-02 LAB — BASIC METABOLIC PANEL
Anion gap: 9 (ref 5–15)
BUN: 38 mg/dL — AB (ref 6–20)
CALCIUM: 8.5 mg/dL — AB (ref 8.9–10.3)
CHLORIDE: 109 mmol/L (ref 101–111)
CO2: 21 mmol/L — ABNORMAL LOW (ref 22–32)
CREATININE: 1.04 mg/dL — AB (ref 0.44–1.00)
GFR calc non Af Amer: 58 mL/min — ABNORMAL LOW (ref >60)
Glucose, Bld: 194 mg/dL — ABNORMAL HIGH (ref 65–99)
Potassium: 4.4 mmol/L (ref 3.5–5.1)
Sodium: 139 mmol/L (ref 135–145)

## 2015-11-02 MED ORDER — LIVING WELL WITH DIABETES BOOK
Freq: Once | Status: AC
  Administered 2015-11-02: 12:00:00
  Filled 2015-11-02: qty 1

## 2015-11-02 NOTE — Progress Notes (Signed)
Inpatient Diabetes Program Recommendations  AACE/ADA: New Consensus Statement on Inpatient Glycemic Control (2015)  Target Ranges:  Prepandial:   less than 140 mg/dL      Peak postprandial:   less than 180 mg/dL (1-2 hours)      Critically ill patients:  140 - 180 mg/dL   Spoke with patient re: insulin recommendations and home insulin regime.  Patient admits to being faithful about taking insulin as ordered (other than this last week when she began to have nausea and vomiting).  Reviewed sick day care. I have ordered the Living Well with Diabetes book for the patient and an outpatient referral to the [facility] for a refresher for diabetes education.  She reports having been seen at the [facility] about 7 years ago but felt she needed to be seen again. Reviewed the need for frequent blood sugar checks when she is sick, the need for insulin when she is sick (should discuss dosing with family doctor) and the need to call MD with vomiting/nausea.   Gentry Fitz, RN, BA, MHA, CDE Diabetes Coordinator Inpatient Diabetes Program  [phone] (Team Pager) [phone] ([place]) 11/02/2015 9:28 AM

## 2015-11-02 NOTE — Care Management Important Message (Signed)
Important Message  Patient Details  Name: Laura Reed MRN: [id number] Date of Birth: 07/16/1960   Medicare Important Message Given:  Yes    Beverly Sessions, RN 11/02/2015, 11:19 AM

## 2015-11-02 NOTE — Progress Notes (Addendum)
Inpatient Diabetes Program Recommendations  AACE/ADA: New Consensus Statement on Inpatient Glycemic Control (2015)  Target Ranges:  Prepandial:   less than 140 mg/dL      Peak postprandial:   less than 180 mg/dL (1-2 hours)      Critically ill patients:  140 - 180 mg/dL   Review of Glycemic Control  Results for WEI, LOHMEIER (MRN [id number]) as of 11/02/2015 08:50  Ref. Range 05/01/2015 11:48 11/01/2015 15:58 11/01/2015 18:27 11/01/2015 20:07 11/02/2015 07:36  Glucose-Capillary Latest Ref Range: 65-99 mg/dL 158 (H) 421 (H) 236 (H) 250 (H) 113 (H)    Diabetes history: Type 2  Outpatient Diabetes medications: Levemir 40 units qpm, Humalog 75/25 mix; 30 units qam, 36 units qpm (= 49.5 basal and 16.5 units rapid acting/day) Current orders for Inpatient glycemic control: Levemir 40 units qpm, Novolog 70/30 25 units bid, Novolog 0-15 units tid  Inpatient Diabetes Program Recommendations:   Patient admitted with nausea and vomiting;  Please consider d/c Novolog 70/30 mix Consider Novolog 5 units tid with meals (hold if she eats less than 50%)  Continue Novolog correction 0-15 units tid. Consider Novolog correction insulin 0-5 units qhs Continue Levemir 40 units qhs   Gentry Fitz, RN, [place], [place], CDE Diabetes Coordinator Inpatient Diabetes Program  [phone] (Team Pager) [phone] ([place]) 11/02/2015 9:12 AM

## 2015-11-02 NOTE — Discharge Summary (Signed)
[facility] NAME: Laura Reed    MR#:  [id number]  DATE OF BIRTH:  06/26/1961  DATE OF ADMISSION:  11/01/2015 ADMITTING PHYSICIAN: Fritzi Mandes, MD  DATE OF DISCHARGE: 11/02/2015 12:07 PM  PRIMARY CARE PHYSICIAN: [name], MD    ADMISSION DIAGNOSIS:  Diabetic ketoacidosis without coma associated with type 1 diabetes mellitus ([place]) [E10.10]  DISCHARGE DIAGNOSIS:  Active Problems:   Acute renal failure (ARF) ([place])   SECONDARY DIAGNOSIS:   Past Medical History  Diagnosis Date  . Diabetes mellitus   . Hypertension   . Hemorrhoid   . PVD (peripheral vascular disease) ([place])   . Heart murmur   . CHF (congestive heart failure) ([place])   . Coronary artery disease   . Cancer ([place])   . Urothelial carcinoma of kidney ([place]) 10/31/2014    INVASIVE UROTHELIAL CARCINOMA, LOW GRADE. T1, Nx.    HOSPITAL COURSE:   55 year old female with past history of diabetes, hypertension, peripheral vascular disease, history of CHF who presented to the hospital due to abdominal pain nausea vomiting and diarrhea noted to have acute gastroenteritis.  1. Acute gastroenteritis-this was viral in nature. Patient presented to the hospital with intractable nausea vomiting for 3 days. -Patient was admitted to the hospital started on supportive care with IV fluids, antiemetics, pain control. Her diarrhea has resolved. Stool for C. difficile and PCR is negative. -She is now tolerating a regular diet well and clinically improved and therefore being discharged home.  2. Acute renal failure-this was secondary to volume loss and dehydration. -This is improved with IV fluid hydration and resolved.  3. Acute hyponatremia-this was secondary to hypovolemia, hyponatremia from the diarrhea and nausea vomiting.  -this has improved and resolved with IV fluid hydration.  4. Uncontrolled diabetes type 2-patient had significantly elevated blood sugars. This was  secondary to the acute viral infection.  -As patient's viral infection is improved. After aggressive IV fluids her blood sugars have also improved. She had no evidence of diabetic ketoacidosis. -She received diabetic education for diabetes coronary. Further follow-up with her primary care physician.  5. Hyperlipidemia-patient will resume her atorvastatin.  6. Anxiety-patient will continue his Xanax.   DISCHARGE CONDITIONS:   Stable  CONSULTS OBTAINED:     DRUG ALLERGIES:  No Known Allergies  DISCHARGE MEDICATIONS:   Discharge Medication List as of 11/02/2015 11:40 AM    CONTINUE these medications which have NOT CHANGED   Details  ALPRAZolam (XANAX) 0.5 MG tablet Take 0.5 mg by mouth 3 (three) times daily as needed for anxiety., Until Discontinued, Historical Med    aspirin 81 MG chewable tablet Chew 81 mg by mouth daily., Until Discontinued, Historical Med    atorvastatin (LIPITOR) 20 MG tablet Take 20 mg by mouth daily., Until Discontinued, Historical Med    Cholecalciferol (VITAMIN D3) 2000 UNITS TABS Take 1 tablet by mouth daily. , Until Discontinued, Historical Med    clopidogrel (PLAVIX) 75 MG tablet Take 75 mg by mouth daily., Until Discontinued, Historical Med    digoxin (LANOXIN) 0.125 MG tablet Take 0.125 mg by mouth daily., Until Discontinued, Historical Med    gabapentin (NEURONTIN) 600 MG tablet Take 600 mg by mouth 3 (three) times daily., Until Discontinued, Historical Med    Insulin Detemir (LEVEMIR FLEXTOUCH) 100 UNIT/ML Pen Inject 40 Units into the skin daily at 10 pm., Until Discontinued, Historical Med    insulin lispro protamine-lispro (HUMALOG 75/25 MIX) (75-25) 100 UNIT/ML SUSP injection Inject  30-36 Units into the skin 2 (two) times daily. Inject 30 units sub-q in morning and inject 36 units sub-q in evening, Until Discontinued, Historical Med    lisinopril (PRINIVIL,ZESTRIL) 20 MG tablet Take 1 tablet (20 mg total) by mouth daily., Starting 10/17/2015,  Until Discontinued, Normal    ondansetron (ZOFRAN) 4 MG tablet Take 1 tablet (4 mg total) by mouth every 8 (eight) hours as needed for nausea or vomiting., Starting 11/01/2015, Until Discontinued, Normal    traMADol (ULTRAM) 50 MG tablet Take 1 tablet (50 mg total) by mouth every 8 (eight) hours as needed., Starting 11/01/2015, Until Discontinued, Print         DISCHARGE INSTRUCTIONS:   DIET:  Cardiac diet and Diabetic diet  DISCHARGE CONDITION:  Stable  ACTIVITY:  Activity as tolerated  OXYGEN:  Home Oxygen: No.   Oxygen Delivery: room air  DISCHARGE LOCATION:  home   If you experience worsening of your admission symptoms, develop shortness of breath, life threatening emergency, suicidal or homicidal thoughts you must seek medical attention immediately by calling 911 or calling your MD immediately  if symptoms less severe.  You Must read complete instructions/literature along with all the possible adverse reactions/side effects for all the Medicines you take and that have been prescribed to you. Take any new Medicines after you have completely understood and accpet all the possible adverse reactions/side effects.   Please note  You were cared for by a hospitalist during your hospital stay. If you have any questions about your discharge medications or the care you received while you were in the hospital after you are discharged, you can call the unit and asked to speak with the hospitalist on call if the hospitalist that took care of you is not available. Once you are discharged, your primary care physician will handle any further medical issues. Please note that NO REFILLS for any discharge medications will be authorized once you are discharged, as it is imperative that you return to your primary care physician (or establish a relationship with a primary care physician if you do not have one) for your aftercare needs so that they can reassess your need for medications and monitor  your lab values.     Today   No abdominal pain, nausea vomiting or diarrhea. Feels much better.  VITAL SIGNS:  Blood pressure 106/44, pulse 71, temperature 98 F (36.7 C), temperature source Oral, resp. rate 18, height 5\' 6"  (1.676 m), weight 72.576 kg (160 lb), SpO2 98 %.  I/O:   Intake/Output Summary (Last 24 hours) at 11/02/15 1602 Last data filed at 11/02/15 0900  Gross per 24 hour  Intake    240 ml  Output      0 ml  Net    240 ml    PHYSICAL EXAMINATION:  GENERAL:  55 y.o.-year-old patient lying in the bed with no acute distress.  EYES: Pupils equal, round, reactive to light and accommodation. No scleral icterus. Extraocular muscles intact.  HEENT: Head atraumatic, normocephalic. Oropharynx and nasopharynx clear.  NECK:  Supple, no jugular venous distention. No thyroid enlargement, no tenderness.  LUNGS: Normal breath sounds bilaterally, no wheezing, rales,rhonchi. No use of accessory muscles of respiration.  CARDIOVASCULAR: S1, S2 normal. No murmurs, rubs, or gallops.  ABDOMEN: Soft, non-tender, non-distended. Bowel sounds present. No organomegaly or mass.  EXTREMITIES: No pedal edema, cyanosis, or clubbing.  NEUROLOGIC: Cranial nerves II through XII are intact. No focal motor or sensory defecits b/l.  PSYCHIATRIC: The patient is alert  and oriented x 3. Good affect.  SKIN: No obvious rash, lesion, or ulcer.   DATA REVIEW:   CBC  Recent Labs Lab 11/01/15 1559  WBC 13.5*  HGB 13.5  HCT 40.3  PLT 298    Chemistries   Recent Labs Lab 11/01/15 1559 11/02/15 0434  NA 131* 139  K 4.9 4.4  CL 98* 109  CO2 18* 21*  GLUCOSE 437* 194*  BUN 46* 38*  CREATININE 1.51* 1.04*  CALCIUM 9.1 8.5*  AST 15  --   ALT 18  --   ALKPHOS 79  --   BILITOT 0.7  --     Cardiac Enzymes  Recent Labs Lab 11/01/15 1559  TROPONINI <0.03    RADIOLOGY:  Dg Chest 2 View  11/01/2015  CLINICAL DATA:  Patient with back pain and vomiting for 1 week. EXAM: CHEST  2 VIEW  COMPARISON:  Chest radiograph 08/15/2013. FINDINGS: Normal cardiac and mediastinal contours status post median sternotomy. No consolidative pulmonary opacities. No pleural effusion or pneumothorax. Mid thoracic spine degenerative changes. IMPRESSION: No acute cardiopulmonary process. Electronically Signed   By: Lovey Newcomer M.D.   On: 11/01/2015 17:56   Dg Abd 1 View  11/01/2015  CLINICAL DATA:  Vomiting with nausea. EXAM: ABDOMEN - 1 VIEW COMPARISON:  None. FINDINGS: The bowel gas pattern is normal. No radio-opaque calculi or other significant radiographic abnormality are seen. IMPRESSION: Negative. Electronically Signed   By: Kathreen Devoid   On: 11/01/2015 11:24      Management plans discussed with the patient, family and they are in agreement.  CODE STATUS:  Code Status History    Date Active Date Inactive Code Status Order ID Comments User Context   11/01/2015  7:53 PM 11/02/2015  3:07 PM Full Code [id number]  Fritzi Mandes, MD Inpatient   05/01/2015 12:45 PM 05/01/2015  9:24 PM Full Code [id number]  Katha Cabal, MD Inpatient   02/20/2015 12:36 PM 02/21/2015  9:34 AM Full Code [id number]  Katha Cabal, MD Inpatient    Advance Directive Documentation        Most Recent Value   Type of Advance Directive  Healthcare Power of [place], Living will   Pre-existing out of facility DNR order (yellow form or pink MOST form)     "MOST" Form in Place?        TOTAL TIME TAKING CARE OF THIS PATIENT: 40 minutes.    Henreitta Leber M.D on 11/02/2015 at 4:02 PM  Between 7am to 6pm - Pager - [phone]  After 6pm go to [email or web address] - password [email or web address] [facility]  Office  [phone]  CC: Primary care physician; [name], MD

## 2015-11-03 LAB — URINE CULTURE

## 2015-11-04 ENCOUNTER — Other Ambulatory Visit: Payer: Self-pay | Admitting: Internal Medicine

## 2015-11-04 ENCOUNTER — Other Ambulatory Visit: Payer: Self-pay | Admitting: Family Medicine

## 2015-11-04 MED ORDER — CIPROFLOXACIN HCL 500 MG PO TABS: 500.0000 mg | ORAL_TABLET | Freq: Two times a day (BID) | ORAL | Status: DC

## 2015-11-05 ENCOUNTER — Telehealth: Payer: Self-pay | Admitting: Internal Medicine

## 2015-11-05 ENCOUNTER — Telehealth: Payer: Self-pay

## 2015-11-05 ENCOUNTER — Ambulatory Visit: Payer: Medicare Other

## 2015-11-05 ENCOUNTER — Other Ambulatory Visit: Payer: Self-pay

## 2015-11-05 DIAGNOSIS — E875 Hyperkalemia: Secondary | ICD-10-CM

## 2015-11-05 NOTE — Telephone Encounter (Signed)
Thank you.  Will continue to follow up with transitional care management and scheduling as appropriate.

## 2015-11-05 NOTE — Telephone Encounter (Addendum)
Transition Care Management Follow-up Telephone Call   Date discharged? 11/02/15   How have you been since you were released from the hospital? Doing better but I have what feels like indigestion or heart burn.  No ABD pain.  No fever.  Eating/drinking okay.  Voiding without issues.  No diarrhea.  No nausea.  No vomiting.  No elevated blood sugars.  Dx today with a UTI and is having a little low back pain.     Do you understand why you were in the hospital? YES, GI viral infection.  Acute renal failure.   Do you understand the discharge instructions? YES, drinking plenty of fluids, increase activity as tolerated, and follow up with PCP.    Where were you discharged to? HOME.   Items Reviewed:  Medications reviewed: YES, started CIPRO today for UTI. Taking all scheduled home medications without issues  Allergies reviewed: YES, no known allergies.  Dietary changes reviewed: YES, cardiac and diabetic diet, no problems.  Referrals reviewed: YES, Diabetic/Nutrional counseling course, HFU with PCP, BMET lab, Urology and CT scan scheduled.     Functional Questionnaire:   Activities of Daily Living (ADLs):   She states they are independent in the following: Independent in all ADLs. States they require assistance with the following: Does not require assistance at this time.     Any transportation issues/concerns?: NO.   Any patient concerns? NO, not at this time.   Confirmed importance and date/time of follow-up visits scheduled YES, appointment scheduled 11/09/15 at 4:30.  BMET scheduled 11/08/15 at 10:45.  Provider Appointment booked with Dr. Derrel Nip (PCP).  Confirmed with patient if condition begins to worsen call PCP or go to the ER.  Patient was given the office number and encouraged to call back with question or concerns.  : YES, patient verbalized understanding.

## 2015-11-05 NOTE — Telephone Encounter (Signed)
HFU, Pt was discharged from the hospital 11/02/2015. Dx was GI virus. Pt called today to sch. No appt avail to sch, Please let me know where to sch. Call pt @ [phone]. Thank you!

## 2015-11-08 ENCOUNTER — Other Ambulatory Visit: Payer: Medicare Other

## 2015-11-09 ENCOUNTER — Other Ambulatory Visit (INDEPENDENT_AMBULATORY_CARE_PROVIDER_SITE_OTHER): Payer: Medicare Other

## 2015-11-09 ENCOUNTER — Encounter: Payer: Self-pay | Admitting: Internal Medicine

## 2015-11-09 ENCOUNTER — Other Ambulatory Visit: Payer: Medicare Other | Admitting: Urology

## 2015-11-09 ENCOUNTER — Ambulatory Visit (INDEPENDENT_AMBULATORY_CARE_PROVIDER_SITE_OTHER): Payer: Medicare Other | Admitting: Internal Medicine

## 2015-11-09 VITALS — BP 112/60 | HR 81 | Temp 98.7°F | Resp 12 | Ht 66.0 in | Wt 162.8 lb

## 2015-11-09 DIAGNOSIS — E875 Hyperkalemia: Secondary | ICD-10-CM | POA: Diagnosis not present

## 2015-11-09 DIAGNOSIS — E1151 Type 2 diabetes mellitus with diabetic peripheral angiopathy without gangrene: Secondary | ICD-10-CM

## 2015-11-09 DIAGNOSIS — E1165 Type 2 diabetes mellitus with hyperglycemia: Secondary | ICD-10-CM

## 2015-11-09 DIAGNOSIS — N178 Other acute kidney failure: Secondary | ICD-10-CM | POA: Diagnosis not present

## 2015-11-09 DIAGNOSIS — Z09 Encounter for follow-up examination after completed treatment for conditions other than malignant neoplasm: Secondary | ICD-10-CM

## 2015-11-09 DIAGNOSIS — E1142 Type 2 diabetes mellitus with diabetic polyneuropathy: Secondary | ICD-10-CM | POA: Diagnosis not present

## 2015-11-09 DIAGNOSIS — IMO0002 Reserved for concepts with insufficient information to code with codable children: Secondary | ICD-10-CM

## 2015-11-09 LAB — COMPREHENSIVE METABOLIC PANEL
ALBUMIN: 3.7 g/dL (ref 3.6–5.1)
ALK PHOS: 72 U/L (ref 33–130)
ALT: 14 U/L (ref 6–29)
AST: 12 U/L (ref 10–35)
BILIRUBIN TOTAL: 0.2 mg/dL (ref 0.2–1.2)
BUN: 26 mg/dL — AB (ref 7–25)
CO2: 23 mmol/L (ref 20–31)
CREATININE: 1.01 mg/dL (ref 0.50–1.05)
Calcium: 8.9 mg/dL (ref 8.6–10.4)
Chloride: 103 mmol/L (ref 98–110)
Glucose, Bld: 211 mg/dL — ABNORMAL HIGH (ref 65–99)
Potassium: 4.8 mmol/L (ref 3.5–5.3)
SODIUM: 137 mmol/L (ref 135–146)
TOTAL PROTEIN: 6.3 g/dL (ref 6.1–8.1)

## 2015-11-09 LAB — BASIC METABOLIC PANEL
BUN: 26 mg/dL — ABNORMAL HIGH (ref 7–25)
CALCIUM: 8.9 mg/dL (ref 8.6–10.4)
CO2: 23 mmol/L (ref 20–31)
Chloride: 103 mmol/L (ref 98–110)
Creat: 1.01 mg/dL (ref 0.50–1.05)
GLUCOSE: 211 mg/dL — AB (ref 65–99)
POTASSIUM: 4.8 mmol/L (ref 3.5–5.3)
SODIUM: 137 mmol/L (ref 135–146)

## 2015-11-09 LAB — HEMOGLOBIN A1C
HEMOGLOBIN A1C: 8.3 % — AB (ref <5.7)
MEAN PLASMA GLUCOSE: 192 mg/dL

## 2015-11-09 MED ORDER — LACTULOSE 20 GM/30ML PO SOLN: ORAL | Status: DC

## 2015-11-09 MED ORDER — LUBIPROSTONE 24 MCG PO CAPS: 24.0000 ug | ORAL_CAPSULE | Freq: Two times a day (BID) | ORAL | Status: DC

## 2015-11-09 NOTE — Patient Instructions (Signed)
YOUR BACK PAIN IS FROM DEGENERATIVE CHANGES TO YOUR THORACIC SPINE  You can continue the tramadol as needed.  NO TYLENOL is in this medication  Please take the Align once daily for 2 weeks to prevent a bad diarrhea from the antibiotics    I have called in LACTULOSE ,  A liquid cathartive laxative,  Drink 30 cc's every 4 hours until you have a BM/

## 2015-11-09 NOTE — Progress Notes (Signed)
Subjective:  Patient ID: Laura Reed, female    DOB: [birth year]  Age: 55 y.o. MRN: [id number]  CC: The primary encounter diagnosis was Acute renal failure with other specified pathological lesion in kidney [facility]). Diagnoses of Uncontrolled type 2 diabetes mellitus with peripheral circulatory disorder [facility]) and Hospital discharge follow-up were also pertinent to this visit.  HPI Laura Reed presents for hospital follow up . She was admitted to [facility] April 27 with hyperglycemia,  Nausea,  vomiting with dehyrdation leading to acute renal failure. She was treated with IV fluids and discharged the following day.   Hx:  5 days before the vomiting staarted she developed back pain radiating to left side.  after working in the yard. Was seen by cook with back pain and vomiting . Referred to ED for hyperglycemia.  Apparently the hosptilaist  did not check the Urine so the UTi diagnosed by Dr Isac Sarna d not get treated until may 1.  Her back  pain has improved somewhat.  It is recurrent on  The side of her recent nephrectomy.  However, she has been constipated , with no BM in 3 days.  Her blood sugars have been running from  90 to 160 .  She has lost 11 lbs since eary April.   Renal cell CA:  CT scheduled for Monday,  And cystoscopy is also scheduled for May 11 .     Outpatient Prescriptions Prior to Visit  Medication Sig Dispense Refill  . ACCU-CHEK AVIVA PLUS test strip CHECK BLOOD SUGAR TWO TO THREE TIMES A DAY 100 each 11  . ALPRAZolam (XANAX) 0.5 MG tablet Take 0.5 mg by mouth 3 (three) times daily as needed for anxiety.    Marland Kitchen aspirin 81 MG chewable tablet Chew 81 mg by mouth daily.    Marland Kitchen atorvastatin (LIPITOR) 20 MG tablet Take 20 mg by mouth daily.    . Cholecalciferol (VITAMIN D3) 2000 UNITS TABS Take 1 tablet by mouth daily.     . ciprofloxacin (CIPRO) 500 MG tablet Take 1 tablet (500 mg total) by mouth 2 (two) times daily. 10 tablet 0  . clopidogrel (PLAVIX) 75 MG tablet Take 75 mg by  mouth daily.    . digoxin (LANOXIN) 0.125 MG tablet Take 0.125 mg by mouth daily.    Marland Kitchen gabapentin (NEURONTIN) 600 MG tablet Take 600 mg by mouth 3 (three) times daily.    . Insulin Detemir (LEVEMIR FLEXTOUCH) 100 UNIT/ML Pen Inject 40 Units into the skin daily at 10 pm.    . insulin lispro protamine-lispro (HUMALOG 75/25 MIX) (75-25) 100 UNIT/ML SUSP injection Inject 30-36 Units into the skin 2 (two) times daily. Inject 30 units sub-q in morning and inject 36 units sub-q in evening    . lisinopril (PRINIVIL,ZESTRIL) 20 MG tablet Take 1 tablet (20 mg total) by mouth daily. 90 tablet 1  . ondansetron (ZOFRAN) 4 MG tablet Take 1 tablet (4 mg total) by mouth every 8 (eight) hours as needed for nausea or vomiting. 20 tablet 0  . traMADol (ULTRAM) 50 MG tablet Take 1 tablet (50 mg total) by mouth every 8 (eight) hours as needed. 30 tablet 0   No facility-administered medications prior to visit.    Review of Systems;  Patient denies headache, fevers, malaise, unintentional weight loss, skin rash, eye pain, sinus congestion and sinus pain, sore throat, dysphagia,  hemoptysis , cough, dyspnea, wheezing, chest pain, palpitations, orthopnea, edema, abdominal pain, nausea, melena, diarrhea, constipation, flank pain, dysuria,  hematuria, urinary  Frequency, nocturia, numbness, tingling, seizures,  Focal weakness, Loss of consciousness,  Tremor, insomnia, depression, anxiety, and suicidal ideation.      Objective:  BP 112/60 mmHg  Pulse 81  Temp(Src) 98.7 F (37.1 C) (Oral)  Resp 12  Ht 5\' 6"  (1.676 m)  Wt 162 lb 12 oz (73.823 kg)  BMI 26.28 kg/m2  SpO2 98%  BP Readings from Last 3 Encounters:  11/09/15 112/60  11/02/15 106/44  11/01/15 138/62    Wt Readings from Last 3 Encounters:  11/09/15 162 lb 12 oz (73.823 kg)  11/01/15 160 lb (72.576 kg)  11/01/15 160 lb 4 oz (72.689 kg)    General appearance: alert, cooperative and appears stated age Ears: normal TM's and external ear canals both  ears Throat: lips, mucosa, and tongue normal; teeth and gums normal Neck: no adenopathy, no carotid bruit, supple, symmetrical, trachea midline and thyroid not enlarged, symmetric, no tenderness/mass/nodules Back: symmetric, no curvature. ROM normal. No CVA tenderness. Lungs: clear to auscultation bilaterally Heart: regular rate and rhythm, S1, S2 normal, no murmur, click, rub or gallop Abdomen: soft, non-tender; bowel sounds normal; no masses,  no organomegaly Pulses: 2+ and symmetric Skin: Skin color, texture, turgor normal. No rashes or lesions Lymph nodes: Cervical, supraclavicular, and axillary nodes normal.   Dg Chest 2 View  11/01/2015  CLINICAL DATA:  Patient with back pain and vomiting for 1 week. EXAM: CHEST  2 VIEW COMPARISON:  Chest radiograph 08/15/2013. FINDINGS: Normal cardiac and mediastinal contours status post median sternotomy. No consolidative pulmonary opacities. No pleural effusion or pneumothorax. Mid thoracic spine degenerative changes. IMPRESSION: No acute cardiopulmonary process. Electronically Signed   By: Lovey Newcomer M.D.   On: 11/01/2015 17:56   Dg Abd 1 View  11/01/2015  CLINICAL DATA:  Vomiting with nausea. EXAM: ABDOMEN - 1 VIEW COMPARISON:  None. FINDINGS: The bowel gas pattern is normal. No radio-opaque calculi or other significant radiographic abnormality are seen. IMPRESSION: Negative. Electronically Signed   By: Kathreen Devoid   On: 11/01/2015 11:24    Assessment & Plan:   Problem List Items Addressed This Visit    Uncontrolled type 2 diabetes mellitus with peripheral circulatory disorder (HCC)    A1c shows worsening control.  Reviewed protocol for insulin use during episodes of acute illnes      Acute renal failure (ARF) ([place]) - Primary    Secondary to dehydration. Now resolved.   Lab Results  Component Value Date   CREATININE 1.01 11/09/2015   CREATININE 1.01 11/09/2015         Hospital discharge follow-up    Patient is stable post discharge  and has no new issues or questions about discharge plans at the visit today for hospital follow up.  I have reviewed the records from the hospital admission in detail with patient today.         I am having Ms. Stork start on Lactulose and lubiprostone. I am also having her maintain her Vitamin D3, aspirin, lisinopril, traMADol, ondansetron, ALPRAZolam, atorvastatin, clopidogrel, digoxin, gabapentin, insulin lispro protamine-lispro, Insulin Detemir, ACCU-CHEK AVIVA PLUS, and ciprofloxacin.  Meds ordered this encounter  Medications  . Lactulose 20 GM/30ML SOLN    Sig: 30 ml every 4 hours until constipation is relieved    Dispense:  236 mL    Refill:  3  . lubiprostone (AMITIZA) 24 MCG capsule    Sig: Take 1 capsule (24 mcg total) by mouth 2 (two) times daily with a meal.  Dispense:  60 capsule    Refill:  3    There are no discontinued medications.  Follow-up: Return in about 3 months (around 02/09/2016).   [name], MD

## 2015-11-09 NOTE — Progress Notes (Signed)
Pre-visit discussion using our clinic review tool. No additional management support is needed unless otherwise documented below in the visit note.  

## 2015-11-11 ENCOUNTER — Encounter: Payer: Self-pay | Admitting: Internal Medicine

## 2015-11-11 DIAGNOSIS — Z09 Encounter for follow-up examination after completed treatment for conditions other than malignant neoplasm: Secondary | ICD-10-CM | POA: Insufficient documentation

## 2015-11-11 NOTE — Assessment & Plan Note (Signed)
Patient is stable post discharge and has no new issues or questions about discharge plans at the visit today for hospital follow up.  I have reviewed the records from the hospital admission in detail with patient today. 

## 2015-11-11 NOTE — Assessment & Plan Note (Addendum)
A1c shows worsening control.  Reviewed protocol for insulin use during episodes of acute illnes

## 2015-11-11 NOTE — Assessment & Plan Note (Addendum)
Secondary to dehydration. Now resolved.   Lab Results  Component Value Date   CREATININE 1.01 11/09/2015   CREATININE 1.01 11/09/2015

## 2015-11-12 ENCOUNTER — Ambulatory Visit
Admission: RE | Admit: 2015-11-12 | Discharge: 2015-11-12 | Disposition: A | Discharge status: Home / Self Care | Payer: Medicare Other | Source: Ambulatory Visit | Attending: Urology | Admitting: Urology

## 2015-11-12 DIAGNOSIS — C642 Malignant neoplasm of left kidney, except renal pelvis: Secondary | ICD-10-CM | POA: Diagnosis not present

## 2015-11-12 DIAGNOSIS — I700 Atherosclerosis of aorta: Secondary | ICD-10-CM | POA: Diagnosis not present

## 2015-11-12 DIAGNOSIS — D3502 Benign neoplasm of left adrenal gland: Secondary | ICD-10-CM | POA: Insufficient documentation

## 2015-11-12 DIAGNOSIS — N200 Calculus of kidney: Secondary | ICD-10-CM | POA: Diagnosis not present

## 2015-11-12 DIAGNOSIS — R918 Other nonspecific abnormal finding of lung field: Secondary | ICD-10-CM | POA: Insufficient documentation

## 2015-11-12 DIAGNOSIS — Z905 Acquired absence of kidney: Secondary | ICD-10-CM | POA: Insufficient documentation

## 2015-11-12 MED ORDER — IOPAMIDOL (ISOVUE-370) INJECTION 76%
100.0000 mL | Freq: Once | INTRAVENOUS | Status: AC | PRN
  Administered 2015-11-12: 100 mL via INTRAVENOUS

## 2015-11-13 ENCOUNTER — Ambulatory Visit (INDEPENDENT_AMBULATORY_CARE_PROVIDER_SITE_OTHER): Payer: Medicare Other | Admitting: Urology

## 2015-11-13 ENCOUNTER — Ambulatory Visit: Payer: Self-pay | Admitting: *Deleted

## 2015-11-13 VITALS — BP 121/79 | HR 93 | Ht 67.0 in | Wt 162.0 lb

## 2015-11-13 DIAGNOSIS — C642 Malignant neoplasm of left kidney, except renal pelvis: Secondary | ICD-10-CM

## 2015-11-13 LAB — URINALYSIS, COMPLETE
BILIRUBIN UA: NEGATIVE
LEUKOCYTES UA: NEGATIVE
Nitrite, UA: NEGATIVE
PH UA: 6.5 (ref 5.0–7.5)
SPEC GRAV UA: 1.015 (ref 1.005–1.030)
Urobilinogen, Ur: 0.2 mg/dL (ref 0.2–1.0)

## 2015-11-13 LAB — MICROSCOPIC EXAMINATION: Epithelial Cells (non renal): >10 /hpf — ABNORMAL HIGH (ref 0–10)

## 2015-11-13 MED ORDER — LIDOCAINE HCL 2 % EX GEL
1.0000 "application " | Freq: Once | CUTANEOUS | Status: AC
  Administered 2015-11-13: 1 via URETHRAL

## 2015-11-13 MED ORDER — CIPROFLOXACIN HCL 500 MG PO TABS
500.0000 mg | ORAL_TABLET | Freq: Once | ORAL | Status: AC
  Administered 2015-11-13: 500 mg via ORAL

## 2015-11-13 NOTE — Progress Notes (Signed)
2:28 PM  11/13/2015  Laura Reed Jul 22, 1960 [id number]  Referring provider: [name], MD [address] [place], [place] [address]  Chief Complaint  Patient presents with  . Cysto    HPI:  1 - Left Upper Tract Transitional Cell Carcinoma - s/p LEFT robotic nephroureterectomy on 10/31/2014 as well as combined ventral hernia repair with Dr. Bary Castilla. Post complicated by wound infection now well healed. Pathology  [id number] with negative margins.  Recent Surveillance: 02/2015 - cysto NED; 05/2015 cysto NED, 08/2015- cysto NED, 11/2015- NED, CT Urogram 11/12/15 negative, poor quality of delayed phase.   2 - Solitary Kidney  - s/p left nephrecotmy as per above. Most recent Cr 1.01 11/2015.   She does have an extensive smoking history, quit 3 years ago but smoked up to 2 packs a day for 35 years. She also has multiple medical comorbidities including history of diabetes, CAD status post CABG, carotid endarterectomy.    She presents today for routine cystoscopy.    She does report over the past 3 months, she was hospitalized with dehydration, URI.  She was also treated for a UTI last week, finished Cipro a few days ago.   PMH: Past Medical History  Diagnosis Date  . Diabetes mellitus   . Hypertension   . Hemorrhoid   . PVD (peripheral vascular disease) ([place])   . Heart murmur   . CHF (congestive heart failure) ([place])   . Coronary artery disease   . Cancer ([place])   . Urothelial carcinoma of kidney ([place]) 10/31/2014    INVASIVE UROTHELIAL CARCINOMA, LOW GRADE. T1, Nx.    Surgical History: Past Surgical History  Procedure Laterality Date  . Coronary artery bypass graft  2009    3 vessel  . Arterial bypass surgry  2009, 2013 x 2    right leg , done in [place]  . Cesarean section    . Carotid endarterectomy  July 2015    Dr Delana Meyer  . Cholecystectomy  03-03-12    Porcelain gallbladder, gallstones,  [place]  . Colonoscopy w/ biopsies  04/28/2012   Hyperplastic rectal polyps.  . Nephrectomy Left 10-31-14  . Hernia repair  10-31-14    ventral, retro-rectus atrium mesh  . Amputation toe    . Cataract extraction w/phaco Right 12/14/2014    Procedure: CATARACT EXTRACTION PHACO AND INTRAOCULAR LENS PLACEMENT (IOC);  Surgeon: Lyla Glassing, MD;  Location: [facility];  Service: Ophthalmology;  Laterality: Right;  [place]   00:38.6              AP        7.1                   CDE  2.76  . Peripheral vascular catheterization Left 05/01/2015    Procedure: Lower Extremity Angiography;  Surgeon: Katha Cabal, MD;  Location: [facility];  Service: Cardiovascular;  Laterality: Left;  . Peripheral vascular catheterization  05/01/2015    Procedure: Lower Extremity Intervention;  Surgeon: Katha Cabal, MD;  Location: [facility];  Service: Cardiovascular;;  . Peripheral vascular catheterization Left 02/20/2015    Procedure: Pelvic Angiography;  Surgeon: Katha Cabal, MD;  Location: [facility];  Service: Cardiovascular;  Laterality: Left;    Home Medications:    Medication List       This list is accurate as of: 11/13/15  2:28 PM.  Always use your most recent med list.  ACCU-CHEK AVIVA PLUS test strip  Generic drug:  glucose blood  CHECK BLOOD SUGAR TWO TO THREE TIMES A DAY     ALPRAZolam 0.5 MG tablet  Commonly known as:  XANAX  Take 0.5 mg by mouth 3 (three) times daily as needed for anxiety.     aspirin 81 MG chewable tablet  Chew 81 mg by mouth daily.     atorvastatin 20 MG tablet  Commonly known as:  LIPITOR  Take 20 mg by mouth daily.     clopidogrel 75 MG tablet  Commonly known as:  PLAVIX  Take 75 mg by mouth daily.     digoxin 0.125 MG tablet  Commonly known as:  LANOXIN  Take 0.125 mg by mouth daily.     gabapentin 600 MG tablet  Commonly known as:  NEURONTIN  Take 600 mg by mouth 3 (three) times daily.     insulin lispro protamine-lispro (75-25) 100 UNIT/ML Susp injection   Commonly known as:  HUMALOG 75/25 MIX  Inject 30-36 Units into the skin 2 (two) times daily. Inject 30 units sub-q in morning and inject 36 units sub-q in evening     Lactulose 20 GM/30ML Soln  30 ml every 4 hours until constipation is relieved     LEVEMIR FLEXTOUCH 100 UNIT/ML Pen  Generic drug:  Insulin Detemir  Inject 40 Units into the skin daily at 10 pm.     lisinopril 20 MG tablet  Commonly known as:  PRINIVIL,ZESTRIL  Take 1 tablet (20 mg total) by mouth daily.     lubiprostone 24 MCG capsule  Commonly known as:  AMITIZA  Take 1 capsule (24 mcg total) by mouth 2 (two) times daily with a meal.     ondansetron 4 MG tablet  Commonly known as:  ZOFRAN  Take 1 tablet (4 mg total) by mouth every 8 (eight) hours as needed for nausea or vomiting.     traMADol 50 MG tablet  Commonly known as:  ULTRAM  Take 1 tablet (50 mg total) by mouth every 8 (eight) hours as needed.     Vitamin D3 2000 units Tabs  Take 1 tablet by mouth daily.        Allergies: No Known Allergies  Family History: Family History  Problem Relation Age of Onset  . Cancer Mother 32    Lung Cancer  . Cancer Father 73    Lung Ca  . Breast cancer Neg Hx     Social History:  reports that she quit smoking about 4 years ago. Her smoking use included Cigarettes. She has never used smokeless tobacco. She reports that she does not drink alcohol or use illicit drugs.  Physical Exam: BP 121/79 mmHg  Pulse 93  Ht 5\' 7"  (1.702 m)  Wt 162 lb (73.483 kg)  BMI 25.37 kg/m2  Constitutional:  Alert and oriented, No acute distress. HEENT: [place] AT, moist mucus membranes.  Trachea midline, no masses. Cardiovascular: No clubbing, cyanosis, or edema. Respiratory: Normal respiratory effort, no increased work of breathing. GI: Abdomen is soft, nontender, nondistended, no abdominal masses.  Wounds well healed, no abdominal hernia.   GU: No CVA tenderness. Normal external genitalia.  Normal meatus. Skin: No rashes,  bruises or suspicious lesions. Neurologic: Grossly intact, no focal deficits, moving all 4 extremities. Psychiatric: Normal mood and affect.  Laboratory Data: Lab Results  Component Value Date   WBC 13.5* 11/01/2015   HGB 13.5 11/01/2015   HCT 40.3 11/01/2015   MCV 85.7 11/01/2015  PLT 298 11/01/2015    Lab Results  Component Value Date   CREATININE 1.01 11/09/2015   CREATININE 1.01 11/09/2015    Lab Results  Component Value Date   HGBA1C 8.3* 11/09/2015   Imaging    Study Result     CLINICAL DATA: Restaging transitional cell carcinoma of the left kidney, status post left nephrectomy.  EXAM: CT ABDOMEN WITHOUT AND WITH CONTRAST  TECHNIQUE: Multidetector CT imaging of the abdomen was performed following the standard protocol before and following the bolus administration of intravenous contrast.  CONTRAST: 100 cc Isovue 370  COMPARISON: CT scan 02/07/2015  FINDINGS: Lower chest: The lung bases are clear of acute process. There is a stable nodule adjacent to the right major fissure which is likely a lymph node. There is also a tiny, sub 3 mm right lower lobe on image number 4 which is stable. A sub 3 mm subpleural nodule in the left lower lobe on image number 13 is also stable. New line the heart is normal in size. No pericardial effusion. Coronary artery calcifications are noted. The distal esophagus is grossly normal.  Hepatobiliary: No focal hepatic lesions or intrahepatic biliary dilatation. The gallbladder is surgically absent. No common bile duct dilatation.  Pancreas: Changes of chronic calcific pancreatitis with marked atrophy of the proximal body and tail the pancreas with dilatation of the pancreatic duct. Stable areas of small cystic change in the pancreatic head along with scattered calcifications. No definite mass. No acute inflammation.  Spleen: Normal size. No focal lesion.  Adrenals/Urinary Tract: Stable left adrenal gland  nodule consistent with benign adenoma. The right adrenal gland is normal.  The left kidney is surgically absent.  The right kidney demonstrates small scattered renal calculi. There is also extensive renal artery calcifications. Normal enhancement/ perfusion. No worrisome renal lesions. No obvious collecting system abnormalities. The upper right ureter appears normal.  Stomach/Bowel: The stomach, duodenum, visualized small bowel and visualized colon are grossly normal. No inflammatory changes, mass lesions or obstructive findings.  Vascular/Lymphatic: Advanced atherosclerotic calcifications involving the aorta and branch vessels. No focal aneurysm or dissection. No mesenteric or retroperitoneal mass or adenopathy.  Other: No ascites or abdominal wall hernia.  Musculoskeletal: No significant bony findings. No evidence of osseous metastatic disease.  IMPRESSION: 1. Status post left nephrectomy. No findings for residual or recurrent tumor or metastatic disease. 2. Scattered right renal calculi. No right-sided renal mass. 3. Stable small bilateral pulmonary nodules the lung bases. 4. Advanced atherosclerotic calcifications involving the aorta and branch vessels. 5. Stable changes of chronic calcific pancreatitis. 6. Stable left adrenal gland adenoma.   Electronically Signed  By: Marijo Sanes M.D.  On: 11/12/2015 14:37     CT scan personally reviewed today.  Cystoscopy Procedure Note  Patient identification was confirmed, informed consent was obtained, and patient was prepped using Betadine solution.  Lidocaine jelly was administered per urethral meatus.    Preoperative abx where received prior to procedure.    Procedure: - Flexible cystoscope introduced, without any difficulty.   - Thorough search of the bladder revealed:    normal urethral meatus    Diffuse mildly erythematous urothelium ) with recent urinary tract infection, no obvious signs of    no  stones    no ulcers     no tumors    no urethral polyps    no trabeculation  - Right UO normal in position and appearance, Left UO not clearly visualized (surgically absent).  Post-Procedure: - Patient tolerated the procedure well.  Fonnie Jarvis chaparone today.   Assessment & Plan:    1 - Left Upper Tract Transitional Cel Carcinoma - NED by cysto today.  CT urogram reviewed, negative although limited delayed phase.   Continue Q19mo cysto at this time given slight bladder erythema on cystoscopy today likely related to recent UTI.  If Cystoscopy negative, may consider spreading out surveillance to every 6 month.  2 - Solitary Kidney  - Excellent overall GFR by recent serum labs, observe.  3 - RTC 3 mo for cysto/ CT scan results with Dr. Erlene Quan.    Return in about 3 months (around 02/13/2016) for cysto.  Hollice Espy, MD  [facility] [address] [place], [place] [address] [phone]

## 2015-11-14 ENCOUNTER — Encounter: Payer: Medicare Other | Attending: Internal Medicine | Admitting: *Deleted

## 2015-11-14 ENCOUNTER — Encounter: Payer: Self-pay | Admitting: *Deleted

## 2015-11-14 VITALS — BP 110/56 | Ht 66.0 in | Wt 163.6 lb

## 2015-11-14 DIAGNOSIS — E119 Type 2 diabetes mellitus without complications: Secondary | ICD-10-CM | POA: Insufficient documentation

## 2015-11-14 DIAGNOSIS — E1159 Type 2 diabetes mellitus with other circulatory complications: Secondary | ICD-10-CM

## 2015-11-14 DIAGNOSIS — Z794 Long term (current) use of insulin: Secondary | ICD-10-CM

## 2015-11-14 NOTE — Patient Instructions (Addendum)
Check blood sugars 3-4 x day before each meal and before bed every day Exercise: Continue walking for 15-20  minutes 2-3  days a week and gradually increase to 30 minutes 5 x week Eat 3 meals day, 2-3 snacks a day Space meals 4-6 hours apart Complete 3 Day Food Record and bring to next appt Bring blood sugar records to the next appointment Carry fast acting glucose and a snack at all times Rotate injection sites Return for appointment on:  Tues Dec 04, 2015 at 11:00 am with [facility] (dietitian)

## 2015-11-14 NOTE — Progress Notes (Signed)
Diabetes Self-Management Education  Visit Type: First/Initial  Appt. Start Time: 1325 Appt. End Time: [id number]  11/14/2015  [name], identified by name and date of birth, is a 55 y.o. female with a diagnosis of Diabetes: Type 2.   ASSESSMENT  Blood pressure 110/56, height 5\' 6"  (1.676 m), weight 163 lb 9.6 oz (74.208 kg). Body mass index is 26.42 kg/(m^2).      Diabetes Self-Management Education - 11/14/15 1444    Visit Information   Visit Type First/Initial   Initial Visit   Diabetes Type Type 2   Are you currently following a meal plan? Yes   What type of meal plan do you follow? "eating 3 meals/day and 3 snacks"   Are you taking your medications as prescribed? Yes   Date Diagnosed 33 years ago   Health Coping   How would you rate your overall health? Fair   Psychosocial Assessment   Patient Belief/Attitude about Diabetes Motivated to manage diabetes  "sometimes I feel deprived"   Self-care barriers None   Self-management support Doctor's office;Friends;Family   Patient Concerns Nutrition/Meal planning;Glycemic Control   Special Needs None   Preferred Learning Style Visual;Auditory   Learning Readiness Change in progress   How often do you need to have someone help you when you read instructions, pamphlets, or other written materials from your doctor or pharmacy? 1 - Never   Pre-Education Assessment   Patient understands the diabetes disease and treatment process. Needs Review   Patient understands incorporating nutritional management into lifestyle. Needs Review   Patient undertands incorporating physical activity into lifestyle. Needs Review   Patient understands using medications safely. Needs Review   Patient understands monitoring blood glucose, interpreting and using results Needs Review   Patient understands prevention, detection, and treatment of acute complications. Needs Review   Patient understands prevention, detection, and treatment of chronic  complications. Needs Review   Patient understands how to develop strategies to address psychosocial issues. Needs Review   Patient understands how to develop strategies to promote health/change behavior. Needs Review   Complications   Last HgB A1C per patient/outside source 8.3 %  11/09/15   How often do you check your blood sugar? 3-4 times/day   Fasting Blood glucose range (mg/dL) 70-129  Pt reports FBG's 99-120's mg/dL   Postprandial Blood glucose range (mg/dL) 130-179;180-200  Pt reports readings before supper 80-150's mg/dL and pp occasionally > 200 mg/dL   Have you had a dilated eye exam in the past 12 months? Yes   Have you had a dental exam in the past 12 months? No  dentures   Are you checking your feet? Yes  2 toes on each foot amputated   How many days per week are you checking your feet? 7   Dietary Intake   Breakfast peanut butter toast with 1/2 banana   Snack (morning) crackers, 1/2 cup applesauce   Lunch ham and cheese on low calorie bread, chips   Snack (afternoon) nuts, Atkins snack   Dinner meat and vegetables   Snack (evening) Greek yogurt   Beverage(s) coffe, water, diet soda   Exercise   Exercise Type Light (walking / raking leaves)   How many days per week to you exercise? 3   How many minutes per day do you exercise? 15   Total minutes per week of exercise 45   Patient Education   Previous Diabetes Education Yes (please comment)  2010 classes at [facility]   Disease state  Definition of  diabetes, type 1 and 2, and the diagnosis of diabetes   Nutrition management  Role of diet in the treatment of diabetes and the relationship between the three main macronutrients and blood glucose level;Food label reading, portion sizes and measuring food.;Carbohydrate counting;Reviewed blood glucose goals for pre and post meals and how to evaluate the patients' food intake on their blood glucose level.;Meal timing in regards to the patients' current diabetes medication.   Physical  activity and exercise  Role of exercise on diabetes management, blood pressure control and cardiac health.   Medications Taught/reviewed insulin injection, site rotation, insulin storage and needle disposal.;Reviewed patients medication for diabetes, action, purpose, timing of dose and side effects.;Reviewed medication adjustment guidelines for hyperglycemia and sick days.   Monitoring Purpose and frequency of SMBG.;Identified appropriate SMBG and/or A1C goals.;Ketone testing, when, how.   Acute complications Taught treatment of hypoglycemia - the 15 rule.   Chronic complications Relationship between chronic complications and blood glucose control   Psychosocial adjustment Role of stress on diabetes;Identified and addressed patients feelings and concerns about diabetes   Individualized Goals (developed by patient)   Reducing Risk Improve blood sugars   Outcomes   Expected Outcomes Demonstrated interest in learning. Expect positive outcomes   Future DMSE 2 wks      Individualized Plan for Diabetes Self-Management Training:   Learning Objective:  Patient will have a greater understanding of diabetes self-management. Patient education plan is to attend individual and/or group sessions per assessed needs and concerns.   Plan:   Patient Instructions  Check blood sugars 3-4 x day before each meal and before bed every day Exercise: Continue walking for 15-20  minutes 2-3  days a week and gradually increase to 30 minutes 5 x week Eat 3 meals day, 2-3 snacks a day Space meals 4-6 hours apart Complete 3 Day Food Record and bring to next appt Bring blood sugar records to the next appointment Carry fast acting glucose and a snack at all times Rotate injection sites Return for appointment on:  Tues Dec 04, 2015 at 11:00 am with [facility] (dietitian)   Expected Outcomes:  Demonstrated interest in learning. Expect positive outcomes  Education material provided:  General Meal Planning  Guidelines Simple Meal Plan 3 Day Food Record Symptoms, causes and treatments of Hypoglycemia Ketone Testing ([facility]) Ketosis and DKA ([facility]) Free Diabetes Necklace coupon  If problems or questions, patient to contact team via:   Johny Drilling, [place], [place], CDE [phone]  Future DSME appointment: 2 wks  Tuesday Dec 04, 2015 at 11:00 am with [facility] (dietitian)

## 2015-11-15 ENCOUNTER — Other Ambulatory Visit: Payer: Medicare Other | Admitting: Urology

## 2015-11-15 ENCOUNTER — Other Ambulatory Visit: Payer: Self-pay | Admitting: Internal Medicine

## 2015-11-15 NOTE — Telephone Encounter (Signed)
I don't see that you normally prescribe this for your patient.  Please advise refill. thanks

## 2015-11-16 NOTE — Telephone Encounter (Signed)
refilled 

## 2015-11-21 LAB — HM DIABETES EYE EXAM

## 2015-11-25 ENCOUNTER — Other Ambulatory Visit: Payer: Self-pay | Admitting: Internal Medicine

## 2015-12-04 ENCOUNTER — Ambulatory Visit: Payer: Medicare Other | Admitting: Dietician

## 2015-12-04 DIAGNOSIS — Z794 Long term (current) use of insulin: Secondary | ICD-10-CM | POA: Diagnosis not present

## 2015-12-04 DIAGNOSIS — B351 Tinea unguium: Secondary | ICD-10-CM | POA: Diagnosis not present

## 2015-12-04 DIAGNOSIS — L851 Acquired keratosis [keratoderma] palmaris et plantaris: Secondary | ICD-10-CM | POA: Diagnosis not present

## 2015-12-04 DIAGNOSIS — E1140 Type 2 diabetes mellitus with diabetic neuropathy, unspecified: Secondary | ICD-10-CM | POA: Diagnosis not present

## 2015-12-05 ENCOUNTER — Telehealth: Payer: Self-pay | Admitting: Internal Medicine

## 2015-12-05 DIAGNOSIS — H60392 Other infective otitis externa, left ear: Secondary | ICD-10-CM | POA: Diagnosis not present

## 2015-12-05 NOTE — Telephone Encounter (Signed)
Left message on machine for patient to return our call 

## 2015-12-05 NOTE — Telephone Encounter (Signed)
Spoke with patient and she went to [facility] and received prednisone ear drops for her ears.

## 2015-12-05 NOTE — Telephone Encounter (Signed)
Patient Name: Laura Reed  DOB: Mar 24, 1961    Initial Comment caller states she has ear pain   Nurse Assessment  Nurse: Mallie Mussel, RN, Alveta Heimlich Date/Time Eilene Ghazi Time): 12/05/2015 11:23:03 AM  Confirm and document reason for call. If symptomatic, describe symptoms. You must click the next button to save text entered. ---Caller states that she has ear pain off and on for quite a while. This time, it began yesterday. She is complaining of pain in her left ear which she rates 4 on 0-10 scale. She does not want this to get worse. Denies fever. This morning, her ear was wet. She can hear fluid moving when she presses on the area. She denies being dizzy and light headed when walking.  Has the patient traveled out of the country within the last 30 days? ---No  Does the patient have any new or worsening symptoms? ---Yes  Will a triage be completed? ---Yes  Related visit to physician within the last 2 weeks? ---No  Does the PT have any chronic conditions? (i.e. diabetes, asthma, etc.) ---Yes  List chronic conditions. ---HTN, Diabetes  Is this a behavioral health or substance abuse call? ---No     Guidelines    Guideline Title Affirmed Question Affirmed Notes  Earache Diabetes mellitus or weak immune system (e.g., HIV positive, cancer chemo, splenectomy, organ transplant, chronic steroids)    Final Disposition User   See Physician within 4 Hours (or PCP triage) Mallie Mussel, RN, [place]    Referrals  REFERRED TO PCP OFFICE   Disagree/Comply: [facility]

## 2015-12-06 DIAGNOSIS — H02403 Unspecified ptosis of bilateral eyelids: Secondary | ICD-10-CM | POA: Diagnosis not present

## 2015-12-12 DIAGNOSIS — E119 Type 2 diabetes mellitus without complications: Secondary | ICD-10-CM | POA: Diagnosis not present

## 2015-12-12 DIAGNOSIS — N183 Chronic kidney disease, stage 3 (moderate): Secondary | ICD-10-CM | POA: Diagnosis not present

## 2015-12-12 DIAGNOSIS — I6523 Occlusion and stenosis of bilateral carotid arteries: Secondary | ICD-10-CM | POA: Diagnosis not present

## 2015-12-23 ENCOUNTER — Other Ambulatory Visit: Payer: Self-pay | Admitting: Internal Medicine

## 2015-12-30 ENCOUNTER — Other Ambulatory Visit: Payer: Self-pay | Admitting: Internal Medicine

## 2015-12-31 ENCOUNTER — Telehealth: Payer: Self-pay

## 2015-12-31 NOTE — Telephone Encounter (Signed)
It;s too early, .  diabetes labs not needed until after august 5

## 2015-12-31 NOTE — Telephone Encounter (Signed)
Called and advised pt. She will re-schedule lab appt at her visit in July with Dr. Derrel Nip.

## 2015-12-31 NOTE — Telephone Encounter (Signed)
Pt coming for lab visit 01/01/16. Please place future orders. Thank you.

## 2016-01-01 ENCOUNTER — Other Ambulatory Visit: Payer: Medicare Other

## 2016-01-01 ENCOUNTER — Encounter: Payer: Self-pay | Admitting: Internal Medicine

## 2016-01-02 ENCOUNTER — Other Ambulatory Visit: Payer: Self-pay | Admitting: Unknown Physician Specialty

## 2016-01-02 ENCOUNTER — Telehealth: Payer: Self-pay | Admitting: Dietician

## 2016-01-02 DIAGNOSIS — E079 Disorder of thyroid, unspecified: Secondary | ICD-10-CM

## 2016-01-02 DIAGNOSIS — E041 Nontoxic single thyroid nodule: Secondary | ICD-10-CM | POA: Diagnosis not present

## 2016-01-02 NOTE — Telephone Encounter (Signed)
Called patient to reschedule appointment, which she cancelled on 12/04/15 due to family emergency. Left voicemail message for her to call back.

## 2016-01-10 ENCOUNTER — Other Ambulatory Visit: Payer: Self-pay | Admitting: Internal Medicine

## 2016-01-16 ENCOUNTER — Encounter: Payer: Self-pay | Admitting: Dietician

## 2016-01-16 NOTE — Progress Notes (Signed)
Have not heard from patient to reschedule her cancelled appointment. Sent discharge letter to MD.

## 2016-01-17 ENCOUNTER — Ambulatory Visit: Payer: Medicare Other | Admitting: Internal Medicine

## 2016-01-28 DIAGNOSIS — Q6689 Other  specified congenital deformities of feet: Secondary | ICD-10-CM | POA: Diagnosis not present

## 2016-01-28 DIAGNOSIS — L97521 Non-pressure chronic ulcer of other part of left foot limited to breakdown of skin: Secondary | ICD-10-CM | POA: Diagnosis not present

## 2016-02-13 ENCOUNTER — Encounter: Payer: Self-pay | Admitting: Urology

## 2016-02-13 ENCOUNTER — Ambulatory Visit (INDEPENDENT_AMBULATORY_CARE_PROVIDER_SITE_OTHER): Payer: Medicare Other | Admitting: Urology

## 2016-02-13 VITALS — BP 122/67 | HR 69 | Ht 66.5 in | Wt 172.6 lb

## 2016-02-13 DIAGNOSIS — C642 Malignant neoplasm of left kidney, except renal pelvis: Secondary | ICD-10-CM | POA: Diagnosis not present

## 2016-02-13 DIAGNOSIS — Q600 Renal agenesis, unilateral: Secondary | ICD-10-CM

## 2016-02-13 DIAGNOSIS — IMO0002 Reserved for concepts with insufficient information to code with codable children: Secondary | ICD-10-CM

## 2016-02-13 LAB — URINALYSIS, COMPLETE
Bilirubin, UA: NEGATIVE
Ketones, UA: NEGATIVE
Leukocytes, UA: NEGATIVE
NITRITE UA: NEGATIVE
PH UA: 5 (ref 5.0–7.5)
Specific Gravity, UA: <1.005 — ABNORMAL LOW (ref 1.005–1.030)
UUROB: 0.2 mg/dL (ref 0.2–1.0)

## 2016-02-13 LAB — MICROSCOPIC EXAMINATION
BACTERIA UA: NONE SEEN
Epithelial Cells (non renal): NONE SEEN /hpf (ref 0–10)

## 2016-02-13 MED ORDER — LIDOCAINE HCL 2 % EX GEL
1.0000 "application " | Freq: Once | CUTANEOUS | Status: AC
  Administered 2016-02-13: 1 via URETHRAL

## 2016-02-13 MED ORDER — CIPROFLOXACIN HCL 500 MG PO TABS
500.0000 mg | ORAL_TABLET | Freq: Once | ORAL | Status: AC
  Administered 2016-02-13: 500 mg via ORAL

## 2016-02-13 NOTE — Progress Notes (Signed)
11:15 AM  02/13/16  Laura Reed January 22, 1961 [id number]  Referring provider: [name], MD [address] [place], [place] [address]  Chief Complaint  Patient presents with  . Cysto     Urothelial carcinoma of kidney, left    HPI:  1 - Left Upper Tract Transitional Cell Carcinoma - s/p LEFT robotic nephroureterectomy on 10/31/2014 as well as combined ventral hernia repair with Dr. Bary Castilla. Post complicated by wound infection now well healed. Pathology  [id number] with negative margins.  Recent Surveillance: 02/2015 - cysto NED; 05/2015 cysto NED, 08/2015- cysto NED, 11/2015- NED, CT Urogram 11/12/15 negative, poor quality of delayed phase.; 5/2-17- cysto with mild erythema following recent UTI   2 - Solitary Kidney  - s/p left nephrecotmy as per above. Most recent Cr 1.01 11/2015.   She does have an extensive smoking history, quit 3 years ago but smoked up to 2 packs a day for 35 years. She also has multiple medical comorbidities including history of diabetes, CAD status post CABG, carotid endarterectomy.    She presents today for routine cystoscopy.     PMH: Past Medical History:  Diagnosis Date  . Cancer ([place])   . CHF (congestive heart failure) ([place])   . Coronary artery disease   . Diabetes mellitus   . Heart murmur   . Hemorrhoid   . Hypertension   . PVD (peripheral vascular disease) ([place])   . Urothelial carcinoma of kidney ([place]) 10/31/2014   INVASIVE UROTHELIAL CARCINOMA, LOW GRADE. T1, Nx.    Surgical History: Past Surgical History:  Procedure Laterality Date  . AMPUTATION TOE    . ARTERIAL BYPASS SURGRY  2009, 2013 x 2   right leg , done in [place]  . CAROTID ENDARTERECTOMY  July 2015   Dr Delana Meyer  . CATARACT EXTRACTION W/PHACO Right 12/14/2014   Procedure: CATARACT EXTRACTION PHACO AND INTRAOCULAR LENS PLACEMENT (IOC);  Surgeon: Lyla Glassing, MD;  Location: [facility];  Service: Ophthalmology;  Laterality: Right;  [place]   00:38.6               AP        7.1                   CDE  2.76  . CESAREAN SECTION    . CHOLECYSTECTOMY  03-03-12   Porcelain gallbladder, gallstones,  [place]  . COLONOSCOPY W/ BIOPSIES  04/28/2012   Hyperplastic rectal polyps.  . CORONARY ARTERY BYPASS GRAFT  2009   3 vessel  . HERNIA REPAIR  10-31-14   ventral, retro-rectus atrium mesh  . NEPHRECTOMY Left 10-31-14  . PERIPHERAL VASCULAR CATHETERIZATION Left 05/01/2015   Procedure: Lower Extremity Angiography;  Surgeon: Katha Cabal, MD;  Location: [facility];  Service: Cardiovascular;  Laterality: Left;  . PERIPHERAL VASCULAR CATHETERIZATION  05/01/2015   Procedure: Lower Extremity Intervention;  Surgeon: Katha Cabal, MD;  Location: [facility];  Service: Cardiovascular;;  . PERIPHERAL VASCULAR CATHETERIZATION Left 02/20/2015   Procedure: Pelvic Angiography;  Surgeon: Katha Cabal, MD;  Location: [facility];  Service: Cardiovascular;  Laterality: Left;    Home Medications:    Medication List       Accurate as of 02/13/16 11:15 AM. Always use your most recent med list.          ACCU-CHEK AVIVA PLUS test strip Generic drug:  glucose blood CHECK BLOOD SUGAR TWO TO THREE TIMES A DAY   ALPRAZolam  0.5 MG tablet Commonly known as:  XANAX Take 0.5 mg by mouth 3 (three) times daily as needed for anxiety.   aspirin 81 MG chewable tablet Chew 81 mg by mouth daily.   atorvastatin 20 MG tablet Commonly known as:  LIPITOR Take 20 mg by mouth daily.   atorvastatin 20 MG tablet Commonly known as:  LIPITOR TAKE 1 TABLET BY MOUTH EVERY DAY   clopidogrel 75 MG tablet Commonly known as:  PLAVIX Take 75 mg by mouth daily.   digoxin 0.125 MG tablet Commonly known as:  LANOXIN Take 0.125 mg by mouth daily.   DIGOX 0.125 MG tablet Generic drug:  digoxin TAKE 1 TABLET BY MOUTH EVERY DAY   gabapentin 600 MG tablet Commonly known as:  NEURONTIN Take 600 mg by mouth 3 (three) times daily.   insulin lispro  protamine-lispro (75-25) 100 UNIT/ML Susp injection Commonly known as:  HUMALOG 75/25 MIX Inject 30-36 Units into the skin 2 (two) times daily. Inject 30 units sub-q in morning and inject 36 units sub-q in evening   HUMALOG MIX 75/25 KWIKPEN (75-25) 100 UNIT/ML Kwikpen Generic drug:  Insulin Lispro Prot & Lispro INJECT 30 UNITS UNDER THE SKIN EVERY MORNING AND 36 UNITS EVERY EVENING   Lactulose 20 GM/30ML Soln 30 ml every 4 hours until constipation is relieved   LEVEMIR FLEXTOUCH 100 UNIT/ML Pen Generic drug:  Insulin Detemir Inject 30 Units into the skin daily at 10 pm.   lisinopril 20 MG tablet Commonly known as:  PRINIVIL,ZESTRIL Take 1 tablet (20 mg total) by mouth daily.   lubiprostone 24 MCG capsule Commonly known as:  AMITIZA Take 1 capsule (24 mcg total) by mouth 2 (two) times daily with a meal.   traMADol 50 MG tablet Commonly known as:  ULTRAM Take 1 tablet (50 mg total) by mouth every 8 (eight) hours as needed.   Vitamin D3 2000 units Tabs Take 1 tablet by mouth daily.       Allergies: No Known Allergies  Family History: Family History  Problem Relation Age of Onset  . Cancer Mother 29    Lung Cancer  . Cancer Father 62    Lung Ca  . Breast cancer Neg Hx   . Diabetes Son     Social History:  reports that she quit smoking about 4 years ago. Her smoking use included Cigarettes. She has a 70.00 pack-year smoking history. She has never used smokeless tobacco. She reports that she does not drink alcohol or use drugs.  Physical Exam: BP 122/67   Pulse 69   Ht 5' 6.5" (1.689 m)   Wt 172 lb 9.6 oz (78.3 kg)   BMI 27.44 kg/m   Constitutional:  Alert and oriented, No acute distress. HEENT:  AT, moist mucus membranes.  Trachea midline, no masses. Cardiovascular: No clubbing, cyanosis, or edema. Respiratory: Normal respiratory effort, no increased work of breathing. GI: Abdomen is soft, nontender, nondistended, no abdominal masses. GU: No CVA tenderness.  Normal external genitalia.  Normal meatus.  Skin: No rashes, bruises or suspicious lesions. Neurologic: Grossly intact, no focal deficits, moving all 4 extremities. Psychiatric: Normal mood and affect.  Laboratory Data: Lab Results  Component Value Date   WBC 13.5 (H) 11/01/2015   HGB 13.5 11/01/2015   HCT 40.3 11/01/2015   MCV 85.7 11/01/2015   PLT 298 11/01/2015    Lab Results  Component Value Date   CREATININE 1.01 11/09/2015   CREATININE 1.01 11/09/2015    Lab Results  Component  Value Date   HGBA1C 8.3 (H) 11/09/2015    Cystoscopy Procedure Note  Patient identification was confirmed, informed consent was obtained, and patient was prepped using Betadine solution.  Lidocaine jelly was administered per urethral meatus.    Preoperative abx where received prior to procedure.    Procedure: - Flexible cystoscope introduced, without any difficulty.   - Thorough search of the bladder revealed:    normal urethral meatus    normal urothelium without masses    no stones    no ulcers     no tumors    no urethral polyps    no trabeculation  - Right UO normal in position and appearance, Left UO not clearly visualized (surgically absent).  Post-Procedure: - Patient tolerated the procedure well  Assessment & Plan:    1 - Left Upper Tract Transitional Cel Carcinoma - NED by cysto today.  Will spread out surveillance to every 6 month. Consider repeat upper tract imaging at 1 year interval  2 - Solitary Kidney  - Excellent overall GFR by recent serum labs, observe.  3 - RTC 6 mo for cysto/ CT scan results with Dr. Erlene Quan.    Return in about 6 months (around 08/15/2016) for cysto.  Hollice Espy, MD  [facility] [address] [place], [place] [address] [phone]

## 2016-02-15 ENCOUNTER — Telehealth: Payer: Self-pay

## 2016-02-15 DIAGNOSIS — IMO0002 Reserved for concepts with insufficient information to code with codable children: Secondary | ICD-10-CM

## 2016-02-15 DIAGNOSIS — E1165 Type 2 diabetes mellitus with hyperglycemia: Principal | ICD-10-CM

## 2016-02-15 DIAGNOSIS — E1151 Type 2 diabetes mellitus with diabetic peripheral angiopathy without gangrene: Secondary | ICD-10-CM

## 2016-02-15 DIAGNOSIS — I10 Essential (primary) hypertension: Secondary | ICD-10-CM

## 2016-02-15 DIAGNOSIS — E1121 Type 2 diabetes mellitus with diabetic nephropathy: Secondary | ICD-10-CM

## 2016-02-15 DIAGNOSIS — E1169 Type 2 diabetes mellitus with other specified complication: Secondary | ICD-10-CM

## 2016-02-15 DIAGNOSIS — E785 Hyperlipidemia, unspecified: Secondary | ICD-10-CM

## 2016-02-15 DIAGNOSIS — R5383 Other fatigue: Secondary | ICD-10-CM

## 2016-02-15 NOTE — Telephone Encounter (Signed)
Pt coming for labs 02/18/16. Please place future orders. Thank you.

## 2016-02-18 ENCOUNTER — Other Ambulatory Visit (INDEPENDENT_AMBULATORY_CARE_PROVIDER_SITE_OTHER): Payer: Medicare Other

## 2016-02-18 DIAGNOSIS — I10 Essential (primary) hypertension: Secondary | ICD-10-CM | POA: Diagnosis not present

## 2016-02-18 DIAGNOSIS — E785 Hyperlipidemia, unspecified: Secondary | ICD-10-CM | POA: Diagnosis not present

## 2016-02-18 DIAGNOSIS — E1151 Type 2 diabetes mellitus with diabetic peripheral angiopathy without gangrene: Secondary | ICD-10-CM | POA: Diagnosis not present

## 2016-02-18 DIAGNOSIS — E1169 Type 2 diabetes mellitus with other specified complication: Secondary | ICD-10-CM | POA: Diagnosis not present

## 2016-02-18 DIAGNOSIS — E1121 Type 2 diabetes mellitus with diabetic nephropathy: Secondary | ICD-10-CM

## 2016-02-18 DIAGNOSIS — R5383 Other fatigue: Secondary | ICD-10-CM

## 2016-02-18 DIAGNOSIS — IMO0002 Reserved for concepts with insufficient information to code with codable children: Secondary | ICD-10-CM

## 2016-02-18 DIAGNOSIS — E1165 Type 2 diabetes mellitus with hyperglycemia: Secondary | ICD-10-CM | POA: Diagnosis not present

## 2016-02-18 LAB — CBC WITH DIFFERENTIAL/PLATELET
BASOS ABS: 0.1 10*3/uL (ref 0.0–0.1)
BASOS PCT: 0.6 % (ref 0.0–3.0)
EOS ABS: 0.2 10*3/uL (ref 0.0–0.7)
Eosinophils Relative: 1.8 % (ref 0.0–5.0)
HEMATOCRIT: 37.8 % (ref 36.0–46.0)
HEMOGLOBIN: 12.8 g/dL (ref 12.0–15.0)
LYMPHS PCT: 18 % (ref 12.0–46.0)
Lymphs Abs: 1.9 10*3/uL (ref 0.7–4.0)
MCHC: 33.7 g/dL (ref 30.0–36.0)
MCV: 87 fl (ref 78.0–100.0)
Monocytes Absolute: 0.7 10*3/uL (ref 0.1–1.0)
Monocytes Relative: 6.3 % (ref 3.0–12.0)
Neutro Abs: 7.7 10*3/uL (ref 1.4–7.7)
Neutrophils Relative %: 73.3 % (ref 43.0–77.0)
Platelets: 266 10*3/uL (ref 150.0–400.0)
RBC: 4.35 Mil/uL (ref 3.87–5.11)
RDW: 13.8 % (ref 11.5–15.5)
WBC: 10.5 10*3/uL (ref 4.0–10.5)

## 2016-02-18 LAB — COMPREHENSIVE METABOLIC PANEL
ALBUMIN: 4.3 g/dL (ref 3.5–5.2)
ALK PHOS: 87 U/L (ref 39–117)
ALT: 22 U/L (ref 0–35)
AST: 17 U/L (ref 0–37)
BUN: 36 mg/dL — AB (ref 6–23)
CO2: 25 mEq/L (ref 19–32)
Calcium: 9.6 mg/dL (ref 8.4–10.5)
Chloride: 106 mEq/L (ref 96–112)
Creatinine, Ser: 1.05 mg/dL (ref 0.40–1.20)
GFR: 57.33 mL/min — ABNORMAL LOW (ref >60.00)
Glucose, Bld: 112 mg/dL — ABNORMAL HIGH (ref 70–99)
POTASSIUM: 4.9 meq/L (ref 3.5–5.1)
SODIUM: 139 meq/L (ref 135–145)
TOTAL PROTEIN: 6.8 g/dL (ref 6.0–8.3)
Total Bilirubin: 0.3 mg/dL (ref 0.2–1.2)

## 2016-02-18 LAB — HEMOGLOBIN A1C: Hgb A1c MFr Bld: 7.1 % — ABNORMAL HIGH (ref 4.6–6.5)

## 2016-02-18 LAB — MICROALBUMIN / CREATININE URINE RATIO
Creatinine,U: 15.5 mg/dL
MICROALB UR: 2.9 mg/dL — AB (ref 0.0–1.9)
Microalb Creat Ratio: 18.7 mg/g (ref 0.0–30.0)

## 2016-02-18 LAB — LIPID PANEL
CHOL/HDL RATIO: 3
CHOLESTEROL: 105 mg/dL (ref 0–200)
HDL: 40 mg/dL (ref >39.00)
LDL CALC: 43 mg/dL (ref 0–99)
NonHDL: 65.17
Triglycerides: 110 mg/dL (ref 0.0–149.0)
VLDL: 22 mg/dL (ref 0.0–40.0)

## 2016-02-19 ENCOUNTER — Ambulatory Visit (INDEPENDENT_AMBULATORY_CARE_PROVIDER_SITE_OTHER): Payer: Medicare Other | Admitting: Internal Medicine

## 2016-02-19 ENCOUNTER — Telehealth: Payer: Self-pay | Admitting: Internal Medicine

## 2016-02-19 VITALS — BP 108/62 | HR 74 | Temp 97.6°F | Resp 18 | Wt 170.0 lb

## 2016-02-19 DIAGNOSIS — E1151 Type 2 diabetes mellitus with diabetic peripheral angiopathy without gangrene: Secondary | ICD-10-CM

## 2016-02-19 DIAGNOSIS — E1121 Type 2 diabetes mellitus with diabetic nephropathy: Secondary | ICD-10-CM

## 2016-02-19 DIAGNOSIS — E1142 Type 2 diabetes mellitus with diabetic polyneuropathy: Secondary | ICD-10-CM | POA: Diagnosis not present

## 2016-02-19 DIAGNOSIS — F419 Anxiety disorder, unspecified: Secondary | ICD-10-CM | POA: Diagnosis not present

## 2016-02-19 DIAGNOSIS — C642 Malignant neoplasm of left kidney, except renal pelvis: Secondary | ICD-10-CM

## 2016-02-19 DIAGNOSIS — E041 Nontoxic single thyroid nodule: Secondary | ICD-10-CM

## 2016-02-19 DIAGNOSIS — E1169 Type 2 diabetes mellitus with other specified complication: Secondary | ICD-10-CM

## 2016-02-19 DIAGNOSIS — F5105 Insomnia due to other mental disorder: Secondary | ICD-10-CM

## 2016-02-19 DIAGNOSIS — E785 Hyperlipidemia, unspecified: Secondary | ICD-10-CM

## 2016-02-19 DIAGNOSIS — Z905 Acquired absence of kidney: Secondary | ICD-10-CM

## 2016-02-19 DIAGNOSIS — H60393 Other infective otitis externa, bilateral: Secondary | ICD-10-CM

## 2016-02-19 MED ORDER — NEOMYCIN-POLYMYXIN-HC 3.5-10000-1 OT SOLN: 3.0000 [drp] | Freq: Four times a day (QID) | OTIC | 0 refills | Status: DC

## 2016-02-19 MED ORDER — LORAZEPAM 0.5 MG PO TABS: 0.5000 mg | ORAL_TABLET | Freq: Every day | ORAL | 1 refills | Status: DC

## 2016-02-19 NOTE — Telephone Encounter (Signed)
Pt called about her ALPRAZolam (XANAX) 0.5 MG tablet was supposed to have been switched to a sleep aid. Please advise?  Call pt @ [phone]. Thank you!

## 2016-02-19 NOTE — Progress Notes (Signed)
leg   Subjective:  Patient ID: Laura Reed, female    DOB: January 14, 1961  Age: 55 y.o. MRN: [id number]  CC: The primary encounter diagnosis was Insomnia secondary to anxiety. Diagnoses of Controlled type 2 DM with peripheral circulatory disorder ([place]), Diabetic nephropathy with proteinuria ([place]), Diabetic peripheral neuropathy associated with type 2 diabetes mellitus ([place]), Hyperlipidemia associated with type 2 diabetes mellitus ([place]), S/p nephrectomy, Otitis, externa, infective, bilateral, Renal cancer, left ([place]), and Thyroid nodule were also pertinent to this visit.  HPI Laura Reed presents for follow up on type 2 DM with nephropathy and peripheral vascular disease, hypertension, and hyperlipidemia.  Cc:  Left ear itching and draining for  the past 2 days,  Treated for otitis exerna in June by Urgent Care with neomycin/polymyxin/ hydorcortisone solution.   Risk factors [facility] and History of TM rupture during hyperbaric  oxygen treatment of diabetic wound back in 2010.   2) Healing superficial laceration right forearm: Dog scratched her right forearm last week .  Has been using topical antibiotic on it .  Tetanus vaccinate reviewed and is up to date    3) DM follow up:  She was last seen may 5 .  Repeat labs done yesterday .  A1c lowered from 8.3 to 7.1  .  Has cut out junk food and has starting an exercise program as well.   4) had bilateral corrective eyelid surgery for management of vision limiting ptosis  in early June . Received  two rounds of prednisone to manage the post operative  swelling  5) Has 2 sebaceous cysts on shoulder blades that become enlarged and red every 2 months, Sister has been managing them  But she wants definitive treatment by excision . Discussed seeing Dr Bary Castilla.  6) Saw podiatry 3 weeks ago  Dr Cleda Mccreedy.  No new ulcers.  Wearing swim shoes when she swims  [facility]  .  7) Had an incidental finding of a Thyroid  cyst evaluated by Laura Reed.  Repeat soft tissue ultrasound in 3 months was recommended .  thyroid function normal.     8( Renal cell CA:  S/p left robotic nephroureterectomy April 2016 for invasive uroepithelial CA .  No recurren e.  Saw Urology on August 9,  Good checkup  rtc 6 months   9) Occasional insomnia: recurrent wakening ,  Has been taking 2 alprazolam at bedtime as needed, at the most 3 times per  Week        Outpatient Medications Prior to Visit  Medication Sig Dispense Refill  . ACCU-CHEK AVIVA PLUS test strip CHECK BLOOD SUGAR TWO TO THREE TIMES A DAY 100 each 11  . ALPRAZolam (XANAX) 0.5 MG tablet Take 0.5 mg by mouth 3 (three) times daily as needed for anxiety.    Marland Kitchen aspirin 81 MG chewable tablet Chew 81 mg by mouth daily.    Marland Kitchen atorvastatin (LIPITOR) 20 MG tablet Take 20 mg by mouth daily.    . Cholecalciferol (VITAMIN D3) 2000 UNITS TABS Take 1 tablet by mouth daily.     . clopidogrel (PLAVIX) 75 MG tablet Take 75 mg by mouth daily.    Marland Kitchen DIGOX 125 MCG tablet TAKE 1 TABLET BY MOUTH EVERY DAY 90 tablet 2  . gabapentin (NEURONTIN) 600 MG tablet Take 600 mg by mouth 3 (three) times daily.    Marland Kitchen HUMALOG MIX 75/25 KWIKPEN (75-25) 100 UNIT/ML Kwikpen INJECT 30 UNITS UNDER THE SKIN  EVERY MORNING AND 36 UNITS EVERY EVENING 30 mL 3  . Insulin Detemir (LEVEMIR FLEXTOUCH) 100 UNIT/ML Pen Inject 30 Units into the skin daily at 10 pm.     . Lactulose 20 GM/30ML SOLN 30 ml every 4 hours until constipation is relieved 236 mL 3  . lisinopril (PRINIVIL,ZESTRIL) 20 MG tablet Take 1 tablet (20 mg total) by mouth daily. 90 tablet 1  . lubiprostone (AMITIZA) 24 MCG capsule Take 1 capsule (24 mcg total) by mouth 2 (two) times daily with a meal. 60 capsule 3  . traMADol (ULTRAM) 50 MG tablet Take 1 tablet (50 mg total) by mouth every 8 (eight) hours as needed. 30 tablet 0  . atorvastatin (LIPITOR) 20 MG tablet TAKE 1 TABLET BY MOUTH EVERY DAY (Patient not taking: Reported on 02/13/2016) 90 tablet 2  .  digoxin (LANOXIN) 0.125 MG tablet Take 0.125 mg by mouth daily.    . insulin lispro protamine-lispro (HUMALOG 75/25 MIX) (75-25) 100 UNIT/ML SUSP injection Inject 30-36 Units into the skin 2 (two) times daily. Inject 30 units sub-q in morning and inject 36 units sub-q in evening     No facility-administered medications prior to visit.     Review of Systems;  Patient denies headache, fevers, malaise, unintentional weight loss, skin rash, eye pain, sinus congestion and sinus pain, sore throat, dysphagia,  hemoptysis , cough, dyspnea, wheezing, chest pain, palpitations, orthopnea, edema, abdominal pain, nausea, melena, diarrhea, constipation, flank pain, dysuria, hematuria, urinary  Frequency, nocturia, numbness, tingling, seizures,  Focal weakness, Loss of consciousness,  Tremor, insomnia, depression, anxiety, and suicidal ideation.      Objective:  BP 108/62   Pulse 74   Temp 97.6 F (36.4 C)   Resp 18   Wt 170 lb (77.1 kg)   BMI 27.03 kg/m   BP Readings from Last 3 Encounters:  02/19/16 108/62  02/13/16 122/67  11/14/15 (!) 110/56    Wt Readings from Last 3 Encounters:  02/19/16 170 lb (77.1 kg)  02/13/16 172 lb 9.6 oz (78.3 kg)  11/14/15 163 lb 9.6 oz (74.2 kg)    General appearance: alert, cooperative and appears stated age Ears: normal TM's and external ear canals both ears Throat: lips, mucosa, and tongue normal; teeth and gums normal Neck: no adenopathy, no carotid bruit, supple, symmetrical, trachea midline and thyroid not enlarged, symmetric, no tenderness/mass/nodules Back: symmetric, no curvature. ROM normal. No CVA tenderness. Lungs: clear to auscultation bilaterally Heart: regular rate and rhythm, S1, S2 normal, no murmur, click, rub or gallop Abdomen: soft, non-tender; bowel sounds normal; no masses,  no organomegaly Pulses: 2+ and symmetric Skin: Skin color, texture, turgor normal. No rashes or lesions Lymph nodes: Cervical, supraclavicular, and axillary nodes  normal.  Lab Results  Component Value Date   HGBA1C 7.1 (H) 02/18/2016   HGBA1C 8.3 (H) 11/09/2015   HGBA1C 7.8 (H) 10/01/2015    Lab Results  Component Value Date   CREATININE 1.05 02/18/2016   CREATININE 1.01 11/09/2015   CREATININE 1.01 11/09/2015    Lab Results  Component Value Date   WBC 10.5 02/18/2016   HGB 12.8 02/18/2016   HCT 37.8 02/18/2016   PLT 266.0 02/18/2016   GLUCOSE 112 (H) 02/18/2016   CHOL 105 02/18/2016   TRIG 110.0 02/18/2016   HDL 40.00 02/18/2016   LDLDIRECT 58.0 07/31/2015   LDLCALC 43 02/18/2016   ALT 22 02/18/2016   AST 17 02/18/2016   NA 139 02/18/2016   K 4.9 02/18/2016   CL 106  02/18/2016   CREATININE 1.05 02/18/2016   BUN 36 (H) 02/18/2016   CO2 25 02/18/2016   TSH 0.96 05/18/2015   INR 0.9 10/17/2014   HGBA1C 7.1 (H) 02/18/2016   MICROALBUR 2.9 (H) 02/18/2016    Ct Abd Wo & W Cm  Result Date: 11/12/2015 CLINICAL DATA:  Restaging transitional cell carcinoma of the left kidney, status post left nephrectomy. EXAM: CT ABDOMEN WITHOUT AND WITH CONTRAST TECHNIQUE: Multidetector CT imaging of the abdomen was performed following the standard protocol before and following the bolus administration of intravenous contrast. CONTRAST:  100 cc Isovue 370 COMPARISON:  CT scan 02/07/2015 FINDINGS: Lower chest: The lung bases are clear of acute process. There is a stable nodule adjacent to the right major fissure which is likely a lymph node. There is also a tiny, sub 3 mm right lower lobe on image number 4 which is stable. A sub 3 mm subpleural nodule in the left lower lobe on image number 13 is also stable. New line the heart is normal in size. No pericardial effusion. Coronary artery calcifications are noted. The distal esophagus is grossly normal. Hepatobiliary: No focal hepatic lesions or intrahepatic biliary dilatation. The gallbladder is surgically absent. No common bile duct dilatation. Pancreas: Changes of chronic calcific pancreatitis with marked  atrophy of the proximal body and tail the pancreas with dilatation of the pancreatic duct. Stable areas of small cystic change in the pancreatic head along with scattered calcifications. No definite mass. No acute inflammation. Spleen: Normal size.  No focal lesion. Adrenals/Urinary Tract: Stable left adrenal gland nodule consistent with benign adenoma. The right adrenal gland is normal. The left kidney is surgically absent. The right kidney demonstrates small scattered renal calculi. There is also extensive renal artery calcifications. Normal enhancement/ perfusion. No worrisome renal lesions. No obvious collecting system abnormalities. The upper right ureter appears normal. Stomach/Bowel: The stomach, duodenum, visualized small bowel and visualized colon are grossly normal. No inflammatory changes, mass lesions or obstructive findings. Vascular/Lymphatic: Advanced atherosclerotic calcifications involving the aorta and branch vessels. No focal aneurysm or dissection. No mesenteric or retroperitoneal mass or adenopathy. Other: No ascites or abdominal wall hernia. Musculoskeletal: No significant bony findings. No evidence of osseous metastatic disease. IMPRESSION: 1. Status post left nephrectomy. No findings for residual or recurrent tumor or metastatic disease. 2. Scattered right renal calculi.  No right-sided renal mass. 3. Stable small bilateral pulmonary nodules the lung bases. 4. Advanced atherosclerotic calcifications involving the aorta and branch vessels. 5. Stable changes of chronic calcific pancreatitis. 6. Stable left adrenal gland adenoma. Electronically Signed   By: Marijo Sanes M.D.   On: 11/12/2015 14:37    Assessment & Plan:   Problem List Items Addressed This Visit    Diabetic peripheral neuropathy associated with type 2 diabetes mellitus ([place])    With serial amputations secondary to gangrene. She is wearing pool shoes when she swims and works out and has frequent follow up with Dr Cleda Mccreedy.         Relevant Medications   LORazepam (ATIVAN) 0.5 MG tablet   Hyperlipidemia associated with type 2 diabetes mellitus ([place])    .Well controlled on current statin therapy.   Liver enzymes are normal , no changes today.  Lab Results  Component Value Date   CHOL 105 02/18/2016   HDL 40.00 02/18/2016   LDLCALC 43 02/18/2016   LDLDIRECT 58.0 07/31/2015   TRIG 110.0 02/18/2016   CHOLHDL 3 02/18/2016   Lab Results  Component Value Date  ALT 22 02/18/2016   AST 17 02/18/2016   ALKPHOS 87 02/18/2016   BILITOT 0.3 02/18/2016               Renal cancer ([place])    Invasive uroepithelial carcinoma of left kidney ,  Diagnosed and treated with nephroureterectomy April 2016.  No recurrence at follow up with Urology.        Relevant Medications   LORazepam (ATIVAN) 0.5 MG tablet   Diabetic nephropathy with proteinuria ([facility])    Managed with Lisinopril 20 mg daily     Lab Results  Component Value Date   MICROALBUR 2.9 (H) 02/18/2016         S/p nephrectomy    Secondary to invasive uroepithelial Ca of left kidney. Removed April 2016.  Right kidney without diease by recent CT /urology follow up      Otitis, externa, infective    Left ear.  Refilling neomycin otic solution       Insomnia secondary to anxiety - Primary    Managed with alprazolam prn . The risks and benefits of benzodiazepine use were reviewed with patient today including excessive sedation leading to respiratory depression,  impaired thinking/driving, and addiction.  Patient was advised to avoid concurrent use with alcohol, to use medication only as needed and not to share with others  .       Relevant Medications   LORazepam (ATIVAN) 0.5 MG tablet   Thyroid nodule    Under surveillance by chap mcqueen with serial [place]      Controlled type 2 DM with peripheral circulatory disorder ([place])    Currently well-controlled on current medications .  hemoglobin A1c is much lower . Patient is  Up to date on  annual eye  exams and foot exam is deferred since she saw podiatry earlier in the week. . Patient has minimal microalbuminuria. Patient is tolerating statin therapy for CAD risk reduction and on ACE for renal protection and hypertension .  Lab Results  Component Value Date   HGBA1C 7.1 (H) 02/18/2016   Lab Results  Component Value Date   MICROALBUR 2.9 (H) 02/18/2016          Other Visit Diagnoses   None.   A total of 40 minutes was spent with patient more than half of which was spent in counseling patient on the above mentioned issues , reviewing and explaining recent labs and imaging studies done, and coordination of care.   I have discontinued Ms. Borchard's digoxin and insulin lispro protamine-lispro. I am also having her start on LORazepam and neomycin-polymyxin-hydrocortisone. Additionally, I am having her maintain her Vitamin D3, aspirin, lisinopril, traMADol, ALPRAZolam, atorvastatin, clopidogrel, gabapentin, Insulin Detemir, ACCU-CHEK AVIVA PLUS, Lactulose, lubiprostone, DIGOX, and HUMALOG MIX 75/25 KWIKPEN.  Meds ordered this encounter  Medications  . LORazepam (ATIVAN) 0.5 MG tablet    Sig: Take 1 tablet (0.5 mg total) by mouth at bedtime. May repeat in one hour if needed    Dispense:  60 tablet    Refill:  1  . neomycin-polymyxin-hydrocortisone (CORTISPORIN) otic solution    Sig: Place 3 drops into both ears 4 (four) times daily. For otitis externa    Dispense:  10 mL    Refill:  0    Medications Discontinued During This Encounter  Medication Reason  . atorvastatin (LIPITOR) 20 MG tablet Duplicate  . digoxin (LANOXIN) 123456 MG tablet Duplicate  . insulin lispro protamine-lispro (HUMALOG 75/25 MIX) (75-25) 100 UNIT/ML SUSP injection Duplicate    Follow-up:  No Follow-up on file.   [name], MD

## 2016-02-19 NOTE — Patient Instructions (Signed)
Your diabetes remains is now under excellent control  And your cholesterol and other labs are also normal. Please continue your current medications. return in 3 months for follow up on diabetes and make sure you are seeing your eye doctor at least once a year.   I have refilled the ear drosp,  Use them 4 times daily until your ear problems .

## 2016-02-19 NOTE — Progress Notes (Signed)
Pre visit review using our clinic review tool, if applicable. No additional management support is needed unless otherwise documented below in the visit note. 

## 2016-02-19 NOTE — Telephone Encounter (Signed)
Prescription refaxed.

## 2016-02-20 ENCOUNTER — Encounter: Payer: Self-pay | Admitting: Internal Medicine

## 2016-02-20 DIAGNOSIS — E113553 Type 2 diabetes mellitus with stable proliferative diabetic retinopathy, bilateral: Secondary | ICD-10-CM | POA: Diagnosis not present

## 2016-02-20 NOTE — Assessment & Plan Note (Addendum)
Currently well-controlled on current medications .  hemoglobin A1c is much lower . Patient is  Up to date on  annual eye exams and foot exam is deferred since she saw podiatry earlier in the week. . Patient has minimal microalbuminuria. Patient is tolerating statin therapy for CAD risk reduction and on ACE for renal protection and hypertension .  Lab Results  Component Value Date   HGBA1C 7.1 (H) 02/18/2016   Lab Results  Component Value Date   MICROALBUR 2.9 (H) 02/18/2016

## 2016-02-21 DIAGNOSIS — F5105 Insomnia due to other mental disorder: Principal | ICD-10-CM

## 2016-02-21 DIAGNOSIS — F419 Anxiety disorder, unspecified: Secondary | ICD-10-CM | POA: Insufficient documentation

## 2016-02-21 DIAGNOSIS — E041 Nontoxic single thyroid nodule: Secondary | ICD-10-CM | POA: Insufficient documentation

## 2016-02-21 DIAGNOSIS — H60399 Other infective otitis externa, unspecified ear: Secondary | ICD-10-CM | POA: Insufficient documentation

## 2016-02-21 NOTE — Assessment & Plan Note (Signed)
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 She is wearing pool shoes when she swims and works out and has frequent follow up with Dr Cleda Mccreedy.

## 2016-02-21 NOTE — Assessment & Plan Note (Signed)
Secondary to invasive uroepithelial Ca of left kidney. Removed April 2016.  Right kidney without diease by recent CT /urology follow up

## 2016-02-21 NOTE — Assessment & Plan Note (Signed)
Under surveillance by chap mcqueen with serial [place]

## 2016-02-21 NOTE — Assessment & Plan Note (Signed)
.  Well controlled on current statin therapy.   Liver enzymes are normal , no changes today.  Lab Results  Component Value Date   CHOL 105 02/18/2016   HDL 40.00 02/18/2016   LDLCALC 43 02/18/2016   LDLDIRECT 58.0 07/31/2015   TRIG 110.0 02/18/2016   CHOLHDL 3 02/18/2016   Lab Results  Component Value Date   ALT 22 02/18/2016   AST 17 02/18/2016   ALKPHOS 87 02/18/2016   BILITOT 0.3 02/18/2016

## 2016-02-21 NOTE — Assessment & Plan Note (Signed)
Invasive uroepithelial carcinoma of left kidney ,  Diagnosed and treated with nephroureterectomy April 2016.  No recurrence at follow up with Urology.

## 2016-02-21 NOTE — Assessment & Plan Note (Signed)
Left ear.  Refilling neomycin otic solution

## 2016-02-21 NOTE — Assessment & Plan Note (Signed)
Managed with alprazolam prn . The risks and benefits of benzodiazepine use were reviewed with patient today including excessive sedation leading to respiratory depression,  impaired thinking/driving, and addiction.  Patient was advised to avoid concurrent use with alcohol, to use medication only as needed and not to share with others  .

## 2016-02-21 NOTE — Assessment & Plan Note (Signed)
Managed with Lisinopril 20 mg daily     Lab Results  Component Value Date   MICROALBUR 2.9 (H) 02/18/2016

## 2016-02-22 ENCOUNTER — Other Ambulatory Visit: Payer: Self-pay | Admitting: Internal Medicine

## 2016-03-07 ENCOUNTER — Other Ambulatory Visit: Payer: Self-pay | Admitting: Internal Medicine

## 2016-03-14 DIAGNOSIS — B351 Tinea unguium: Secondary | ICD-10-CM | POA: Diagnosis not present

## 2016-03-14 DIAGNOSIS — Z794 Long term (current) use of insulin: Secondary | ICD-10-CM | POA: Diagnosis not present

## 2016-03-14 DIAGNOSIS — L851 Acquired keratosis [keratoderma] palmaris et plantaris: Secondary | ICD-10-CM | POA: Diagnosis not present

## 2016-03-14 DIAGNOSIS — E1140 Type 2 diabetes mellitus with diabetic neuropathy, unspecified: Secondary | ICD-10-CM | POA: Diagnosis not present

## 2016-03-19 ENCOUNTER — Ambulatory Visit: Payer: Medicare Other

## 2016-03-19 ENCOUNTER — Ambulatory Visit
Admission: RE | Admit: 2016-03-19 | Discharge: 2016-03-19 | Disposition: A | Discharge status: Home / Self Care | Payer: Medicare Other | Source: Ambulatory Visit | Attending: Unknown Physician Specialty | Admitting: Unknown Physician Specialty

## 2016-03-19 ENCOUNTER — Ambulatory Visit (INDEPENDENT_AMBULATORY_CARE_PROVIDER_SITE_OTHER): Payer: Medicare Other | Admitting: Family

## 2016-03-19 ENCOUNTER — Encounter: Payer: Self-pay | Admitting: Family

## 2016-03-19 VITALS — BP 148/50 | HR 82 | Temp 98.9°F | Ht 67.0 in | Wt 173.0 lb

## 2016-03-19 DIAGNOSIS — E079 Disorder of thyroid, unspecified: Secondary | ICD-10-CM

## 2016-03-19 DIAGNOSIS — H6692 Otitis media, unspecified, left ear: Secondary | ICD-10-CM | POA: Diagnosis not present

## 2016-03-19 DIAGNOSIS — E782 Mixed hyperlipidemia: Secondary | ICD-10-CM | POA: Diagnosis not present

## 2016-03-19 DIAGNOSIS — J0120 Acute ethmoidal sinusitis, unspecified: Secondary | ICD-10-CM | POA: Diagnosis not present

## 2016-03-19 DIAGNOSIS — R05 Cough: Secondary | ICD-10-CM

## 2016-03-19 DIAGNOSIS — I739 Peripheral vascular disease, unspecified: Secondary | ICD-10-CM | POA: Diagnosis not present

## 2016-03-19 DIAGNOSIS — R059 Cough, unspecified: Secondary | ICD-10-CM

## 2016-03-19 DIAGNOSIS — I25810 Atherosclerosis of coronary artery bypass graft(s) without angina pectoris: Secondary | ICD-10-CM | POA: Diagnosis not present

## 2016-03-19 DIAGNOSIS — E041 Nontoxic single thyroid nodule: Secondary | ICD-10-CM | POA: Diagnosis not present

## 2016-03-19 MED ORDER — AMOXICILLIN 500 MG PO CAPS: 500.0000 mg | ORAL_CAPSULE | Freq: Two times a day (BID) | ORAL | 0 refills | Status: DC

## 2016-03-19 MED ORDER — BENZONATATE 100 MG PO CAPS: 100.0000 mg | ORAL_CAPSULE | Freq: Two times a day (BID) | ORAL | 0 refills | Status: DC | PRN

## 2016-03-19 MED ORDER — OFLOXACIN 0.3 % OT SOLN: OTIC | 0 refills | Status: DC

## 2016-03-19 NOTE — Progress Notes (Signed)
Subjective:    Patient ID: Laura Reed, female    DOB: May 29, 1961, 55 y.o.   MRN: [id number]  CC: Laura Reed is a 55 y.o. female who presents today for an acute visit.    HPI:  Patient here for chief complaint of sinus pressure and dry cough for 5 days, unchanged. No fever, chills, SOB, wheezing. No lung disease. Endorses moisture in left ear noticed yesterday. No ear pain. Has tried dayquil, nyquil,and mucinex with some relief.   Left nephrectomy.    HISTORY:  Past Medical History:  Diagnosis Date  . Cancer ([place])   . CHF (congestive heart failure) ([place])   . Coronary artery disease   . Diabetes mellitus   . Heart murmur   . Hemorrhoid   . Hypertension   . PVD (peripheral vascular disease) ([place])   . Urothelial carcinoma of kidney ([place]) 10/31/2014   INVASIVE UROTHELIAL CARCINOMA, LOW GRADE. T1, Nx.   Past Surgical History:  Procedure Laterality Date  . AMPUTATION TOE    . ARTERIAL BYPASS SURGRY  2009, 2013 x 2   right leg , done in [place]  . CAROTID ENDARTERECTOMY  July 2015   Dr Delana Meyer  . CATARACT EXTRACTION W/PHACO Right 12/14/2014   Procedure: CATARACT EXTRACTION PHACO AND INTRAOCULAR LENS PLACEMENT (IOC);  Surgeon: Lyla Glassing, MD;  Location: [facility]S;  Service: Ophthalmology;  Laterality: Right;  [place]   00:38.6              AP        7.1                   CDE  2.76  . CESAREAN SECTION    . CHOLECYSTECTOMY  03-03-12   Porcelain gallbladder, gallstones,  [facility]  . COLONOSCOPY W/ BIOPSIES  04/28/2012   Hyperplastic rectal polyps.  . CORONARY ARTERY BYPASS GRAFT  2009   3 vessel  . HERNIA REPAIR  10-31-14   ventral, retro-rectus atrium mesh  . NEPHRECTOMY Left 10-31-14  . PERIPHERAL VASCULAR CATHETERIZATION Left 05/01/2015   Procedure: Lower Extremity Angiography;  Surgeon: Katha Cabal, MD;  Location: [place] [facility];  Service: Cardiovascular;  Laterality: Left;  . PERIPHERAL VASCULAR CATHETERIZATION  05/01/2015   Procedure: Lower Extremity  Intervention;  Surgeon: Katha Cabal, MD;  Location: [place] [facility];  Service: Cardiovascular;;  . PERIPHERAL VASCULAR CATHETERIZATION Left 02/20/2015   Procedure: Pelvic Angiography;  Surgeon: Katha Cabal, MD;  Location: [place] [facility];  Service: Cardiovascular;  Laterality: Left;   Family History  Problem Relation Age of Onset  . Cancer Mother 40    Lung Cancer  . Cancer Father 79    Lung Ca  . Diabetes Son   . Breast cancer Neg Hx     Allergies: Review of patient's allergies indicates no known allergies. Current Outpatient Prescriptions on File Prior to Visit  Medication Sig Dispense Refill  . ACCU-CHEK AVIVA PLUS test strip CHECK BLOOD SUGAR TWO TO THREE TIMES A DAY 100 each 11  . ALPRAZolam (XANAX) 0.5 MG tablet Take 0.5 mg by mouth 3 (three) times daily as needed for anxiety.    Marland Kitchen aspirin 81 MG chewable tablet Chew 81 mg by mouth daily.    Marland Kitchen atorvastatin (LIPITOR) 20 MG tablet Take 20 mg by mouth daily.    . B-D ULTRAFINE III SHORT PEN 31G X 8 MM MISC TEST BLOOD SUGAR TWICE DAILY 100 each 0  . Cholecalciferol (VITAMIN D3) 2000  UNITS TABS Take 1 tablet by mouth daily.     . clopidogrel (PLAVIX) 75 MG tablet Take 75 mg by mouth daily.    . clopidogrel (PLAVIX) 75 MG tablet TAKE 1 TABLET BY MOUTH EVERY DAY 90 tablet 0  . DIGOX 125 MCG tablet TAKE 1 TABLET BY MOUTH EVERY DAY 90 tablet 2  . gabapentin (NEURONTIN) 600 MG tablet Take 600 mg by mouth 3 (three) times daily.    Marland Kitchen HUMALOG MIX 75/25 KWIKPEN (75-25) 100 UNIT/ML Kwikpen INJECT 30 UNITS UNDER THE SKIN EVERY MORNING AND 36 UNITS EVERY EVENING 30 mL 3  . Insulin Detemir (LEVEMIR FLEXTOUCH) 100 UNIT/ML Pen Inject 30 Units into the skin daily at 10 pm.     . Lactulose 20 GM/30ML SOLN 30 ml every 4 hours until constipation is relieved 236 mL 3  . LEVEMIR FLEXTOUCH 100 UNIT/ML Pen INJECT 40 UNITS UNDER THE SKIN EVERY DAY AT 10 PM 15 mL 0  . lisinopril (PRINIVIL,ZESTRIL) 20 MG tablet Take 1 tablet (20 mg total)  by mouth daily. 90 tablet 1  . LORazepam (ATIVAN) 0.5 MG tablet Take 1 tablet (0.5 mg total) by mouth at bedtime. May repeat in one hour if needed 60 tablet 1  . lubiprostone (AMITIZA) 24 MCG capsule Take 1 capsule (24 mcg total) by mouth 2 (two) times daily with a meal. 60 capsule 3  . traMADol (ULTRAM) 50 MG tablet Take 1 tablet (50 mg total) by mouth every 8 (eight) hours as needed. 30 tablet 0   No current facility-administered medications on file prior to visit.     Social History  Substance Use Topics  . Smoking status: Former Smoker    Packs/day: 2.00    Years: 35.00    Types: Cigarettes    Quit date: 03/30/2011  . Smokeless tobacco: Never Used  . Alcohol use No    Review of Systems  Constitutional: Negative for chills and fever.  HENT: Positive for congestion, ear discharge (left), postnasal drip and sinus pressure. Negative for ear pain and sore throat.   Respiratory: Negative for cough, shortness of breath and wheezing.   Cardiovascular: Negative for chest pain, palpitations and leg swelling.  Gastrointestinal: Negative for nausea and vomiting.      Objective:    BP (!) 148/50   Pulse 82   Temp 98.9 F (37.2 C) (Oral)   Ht 5\' 7"  (1.702 m)   Wt 173 lb (78.5 kg)   SpO2 93%   BMI 27.10 kg/m    Physical Exam  Constitutional: She appears well-developed and well-nourished.  HENT:  Head: Normocephalic and atraumatic.  Right Ear: Hearing, tympanic membrane, external ear and ear canal normal. No drainage, swelling or tenderness. No foreign bodies. Tympanic membrane is not erythematous and not bulging. No middle ear effusion. No decreased hearing is noted.  Left Ear: Hearing, tympanic membrane and ear canal normal. There is drainage (scant white discharge noted in external ear canal). No swelling or tenderness. No foreign bodies. Tympanic membrane is not erythematous and not bulging.  No middle ear effusion. No decreased hearing is noted.  Nose: Nose normal. No  rhinorrhea. Right sinus exhibits no maxillary sinus tenderness and no frontal sinus tenderness. Left sinus exhibits no maxillary sinus tenderness and no frontal sinus tenderness.  Mouth/Throat: Uvula is midline, oropharynx is clear and moist and mucous membranes are normal. No oropharyngeal exudate, posterior oropharyngeal edema, posterior oropharyngeal erythema or tonsillar abscesses.  Unable to appreciate whether left TM is perforated.   Eyes:  Conjunctivae are normal.  Cardiovascular: Regular rhythm, normal heart sounds and normal pulses.   Pulmonary/Chest: Effort normal and breath sounds normal. She has no wheezes. She has no rhonchi. She has no rales.  Lymphadenopathy:       Head (right side): No submental, no submandibular, no tonsillar, no preauricular, no posterior auricular and no occipital adenopathy present.       Head (left side): No submental, no submandibular, no tonsillar, no preauricular, no posterior auricular and no occipital adenopathy present.    She has no cervical adenopathy.  Neurological: She is alert.  Skin: Skin is warm and dry.  Psychiatric: She has a normal mood and affect. Her speech is normal and behavior is normal. Thought content normal.  Vitals reviewed.      Assessment & Plan:   1. Acute left otitis media, recurrence not specified, unspecified otitis media type Left ear scant drainage. Unable to tell if TM perforated. Covering patient with ofloxacin.  - ofloxacin (FLOXIN) 0.3 % otic solution; 10 gtts daily in affected ear(s) for 7 days.  Dispense: 4 mL; Refill: 0  2. Acute ethmoidal sinusitis, recurrence not specified Based on duration of symptoms, patient and I jointly agreed to start antibiotic therapy. - amoxicillin (AMOXIL) 500 MG capsule; Take 1 capsule (500 mg total) by mouth 2 (two) times daily.  Dispense: 14 capsule; Refill: 0  3. Cough No adventitious lung sounds. Suspect related to postnasal drainage or viral etiology. - benzonatate (TESSALON)  100 MG capsule; Take 1 capsule (100 mg total) by mouth 2 (two) times daily as needed for cough.  Dispense: 20 capsule; Refill: 0  Note, patient stated her blood pressure was 140/82 prior to this visit when she was at her cardiologist office. She states she will recheck her blood pressure at home.  I have discontinued Ms. Serpa's neomycin-polymyxin-hydrocortisone. I am also having her start on ofloxacin, amoxicillin, and benzonatate. Additionally, I am having her maintain her Vitamin D3, aspirin, lisinopril, traMADol, ALPRAZolam, atorvastatin, clopidogrel, gabapentin, Insulin Detemir, ACCU-CHEK AVIVA PLUS, Lactulose, lubiprostone, DIGOX, HUMALOG MIX 75/25 KWIKPEN, LORazepam, clopidogrel, B-D ULTRAFINE III SHORT PEN, LEVEMIR FLEXTOUCH, and FLUVIRIN.   Meds ordered this encounter  Medications  . FLUVIRIN SUSP  . ofloxacin (FLOXIN) 0.3 % otic solution    Sig: 10 gtts daily in affected ear(s) for 7 days.    Dispense:  4 mL    Refill:  0    Order Specific Question:   Supervising Provider    Answer:   Derrel Nip, TERESA L [[id number]]  . amoxicillin (AMOXIL) 500 MG capsule    Sig: Take 1 capsule (500 mg total) by mouth 2 (two) times daily.    Dispense:  14 capsule    Refill:  0    Order Specific Question:   Supervising Provider    Answer:   Deborra Medina L [[id number]]  . benzonatate (TESSALON) 100 MG capsule    Sig: Take 1 capsule (100 mg total) by mouth 2 (two) times daily as needed for cough.    Dispense:  20 capsule    Refill:  0    Order Specific Question:   Supervising Provider    Answer:   [name] [[id number]]    Return precautions given.   Risks, benefits, and alternatives of the medications and treatment plan prescribed today were discussed, and patient expressed understanding.   Education regarding symptom management and diagnosis given to patient on AVS.  Continue to follow with [name], [name], MD for routine health maintenance.  [name] and I agreed with plan.   Mable Paris, FNP

## 2016-03-19 NOTE — Patient Instructions (Signed)
Increase intake of clear fluids. Congestion is best treated by hydration, when mucus is wetter, it is thinner, less sticky, and easier to expel from the body, either through coughing up drainage, or by blowing your nose.   Get plenty of rest.   Use saline nasal drops and blow your nose frequently. Run a humidifier at night and elevate the head of the bed. Vicks Vapor rub will help with congestion and cough. Steam showers and sinus massage for congestion.   You can also try a teaspoon of honey to see if this will help reduce cough. Throat lozenges can sometimes be beneficial as well.    This illness will typically last 7 - 10 days.   Please follow up with our clinic if you develop a fever greater than 101 F, symptoms worsen, or do not resolve in the next week.       Otitis Externa Otitis externa is a bacterial or fungal infection of the outer ear canal. This is the area from the eardrum to the outside of the ear. Otitis externa is sometimes called "swimmer's ear." CAUSES  Possible causes of infection include:  Swimming in dirty water.  Moisture remaining in the ear after swimming or bathing.  Mild injury (trauma) to the ear.  Objects stuck in the ear (foreign body).  Cuts or scrapes (abrasions) on the outside of the ear. SIGNS AND SYMPTOMS  The first symptom of infection is often itching in the ear canal. Later signs and symptoms may include swelling and redness of the ear canal, ear pain, and yellowish-white fluid (pus) coming from the ear. The ear pain may be worse when pulling on the earlobe. DIAGNOSIS  Your health care provider will perform a physical exam. A sample of fluid may be taken from the ear and examined for bacteria or fungi. TREATMENT  Antibiotic ear drops are often given for 10 to 14 days. Treatment may also include pain medicine or corticosteroids to reduce itching and swelling. HOME CARE INSTRUCTIONS   Apply antibiotic ear drops to the ear canal as prescribed by  your health care provider.  Take medicines only as directed by your health care provider.  If you have diabetes, follow any additional treatment instructions from your health care provider.  Keep all follow-up visits as directed by your health care provider. PREVENTION   Keep your ear dry. Use the corner of a towel to absorb water out of the ear canal after swimming or bathing.  Avoid scratching or putting objects inside your ear. This can damage the ear canal or remove the protective wax that lines the canal. This makes it easier for bacteria and fungi to grow.  Avoid swimming in lakes, polluted water, or poorly chlorinated pools.  You may use ear drops made of rubbing alcohol and vinegar after swimming. Combine equal parts of white vinegar and alcohol in a bottle. Put 3 or 4 drops into each ear after swimming. SEEK MEDICAL CARE IF:   You have a fever.  Your ear is still red, swollen, painful, or draining pus after 3 days.  Your redness, swelling, or pain gets worse.  You have a severe headache.  You have redness, swelling, pain, or tenderness in the area behind your ear. MAKE SURE YOU:   Understand these instructions.  Will watch your condition.  Will get help right away if you are not doing well or get worse.   This information is not intended to replace advice given to you by your health care  provider. Make sure you discuss any questions you have with your health care provider.   Document Released: 06/23/2005 Document Revised: 07/14/2014 Document Reviewed: 07/10/2011 Elsevier Interactive Patient Education [employer].

## 2016-03-19 NOTE — Progress Notes (Signed)
Pre visit review using our clinic review tool, if applicable. No additional management support is needed unless otherwise documented below in the visit note. 

## 2016-03-20 ENCOUNTER — Other Ambulatory Visit: Payer: Self-pay | Admitting: Internal Medicine

## 2016-03-20 NOTE — Telephone Encounter (Signed)
Rx sent to pharmacy   

## 2016-03-21 ENCOUNTER — Other Ambulatory Visit: Payer: Self-pay | Admitting: Unknown Physician Specialty

## 2016-03-21 ENCOUNTER — Encounter: Payer: Self-pay | Admitting: Internal Medicine

## 2016-03-21 DIAGNOSIS — E041 Nontoxic single thyroid nodule: Secondary | ICD-10-CM

## 2016-03-24 ENCOUNTER — Telehealth: Payer: Self-pay | Admitting: Internal Medicine

## 2016-03-24 NOTE — Telephone Encounter (Signed)
Spoke with patient and she is not having fever or SOB. She is going to finish ABX and caal if she has any problems

## 2016-03-24 NOTE — Telephone Encounter (Signed)
Please call patient-   She has been on 5 days of amoxillin out of 7. I would like her complete antibiotic.   Does she have fever, SOB?

## 2016-03-24 NOTE — Telephone Encounter (Signed)
Does patient need to be seen again?

## 2016-03-24 NOTE — Telephone Encounter (Signed)
Pt saw [facility] last week. Pt says she still has the cough (is not better), and drip in the back of her throught. Pt is requesting another antibiotic.

## 2016-04-07 ENCOUNTER — Other Ambulatory Visit: Payer: Self-pay | Admitting: Internal Medicine

## 2016-04-09 ENCOUNTER — Ambulatory Visit (INDEPENDENT_AMBULATORY_CARE_PROVIDER_SITE_OTHER): Payer: Medicare Other | Admitting: Family

## 2016-04-09 ENCOUNTER — Encounter: Payer: Self-pay | Admitting: Family

## 2016-04-09 VITALS — BP 156/68 | HR 75 | Temp 97.6°F | Ht 66.5 in | Wt 171.6 lb

## 2016-04-09 DIAGNOSIS — R05 Cough: Secondary | ICD-10-CM | POA: Diagnosis not present

## 2016-04-09 DIAGNOSIS — R059 Cough, unspecified: Secondary | ICD-10-CM

## 2016-04-09 DIAGNOSIS — R300 Dysuria: Secondary | ICD-10-CM | POA: Diagnosis not present

## 2016-04-09 LAB — POCT URINALYSIS DIPSTICK
BILIRUBIN UA: NEGATIVE
Glucose, UA: NEGATIVE
KETONES UA: NEGATIVE
Leukocytes, UA: NEGATIVE
Nitrite, UA: NEGATIVE
PH UA: 5
PROTEIN UA: 100
Urobilinogen, UA: 0.2

## 2016-04-09 NOTE — Progress Notes (Signed)
Pre visit review using our clinic review tool, if applicable. No additional management support is needed unless otherwise documented below in the visit note. 

## 2016-04-09 NOTE — Progress Notes (Addendum)
Subjective:    Patient ID: Laura Reed, female    DOB: 1961/03/09, 55 y.o.   MRN: [id number]  CC: Laura Reed is a 55 y.o. female who presents today for an acute visit.    HPI: Patient here for acute visit with chief complaint of dysuria x 3 days. No concerns for STDs. No recurrent UTIs. She does have history of renal cancer and s/p left nephrectomy. 3 days ago urine odor. Has been wearing pads because coughing which she wanders if causing symptoms.. Started amoxicillin one week ago for sinusitis, improved. Still complains of nasal congestion, cough. No wheezing, SOB, fever, chills.         HISTORY:  Past Medical History:  Diagnosis Date  . Cancer ([place])   . CHF (congestive heart failure) ([place])   . Coronary artery disease   . Diabetes mellitus   . Heart murmur   . Hemorrhoid   . Hypertension   . PVD (peripheral vascular disease) ([place])   . Urothelial carcinoma of kidney ([place]) 10/31/2014   INVASIVE UROTHELIAL CARCINOMA, LOW GRADE. T1, Nx.   Past Surgical History:  Procedure Laterality Date  . AMPUTATION TOE    . ARTERIAL BYPASS SURGRY  2009, 2013 x 2   right leg , done in [place]  . CAROTID ENDARTERECTOMY  July 2015   Dr Delana Meyer  . CATARACT EXTRACTION W/PHACO Right 12/14/2014   Procedure: CATARACT EXTRACTION PHACO AND INTRAOCULAR LENS PLACEMENT (IOC);  Surgeon: Lyla Glassing, MD;  Location: [facility];  Service: Ophthalmology;  Laterality: Right;  [place]   00:38.6              AP        7.1                   CDE  2.76  . CESAREAN SECTION    . CHOLECYSTECTOMY  03-03-12   Porcelain gallbladder, gallstones,  [facility]  . COLONOSCOPY W/ BIOPSIES  04/28/2012   Hyperplastic rectal polyps.  . CORONARY ARTERY BYPASS GRAFT  2009   3 vessel  . HERNIA REPAIR  10-31-14   ventral, retro-rectus atrium mesh  . NEPHRECTOMY Left 10-31-14  . PERIPHERAL VASCULAR CATHETERIZATION Left 05/01/2015   Procedure: Lower Extremity Angiography;  Surgeon: Katha Cabal, MD;  Location: [place]  [facility];  Service: Cardiovascular;  Laterality: Left;  . PERIPHERAL VASCULAR CATHETERIZATION  05/01/2015   Procedure: Lower Extremity Intervention;  Surgeon: Katha Cabal, MD;  Location: [place] [facility];  Service: Cardiovascular;;  . PERIPHERAL VASCULAR CATHETERIZATION Left 02/20/2015   Procedure: Pelvic Angiography;  Surgeon: Katha Cabal, MD;  Location: [place] [facility];  Service: Cardiovascular;  Laterality: Left;   Family History  Problem Relation Age of Onset  . Cancer Mother 73    Lung Cancer  . Cancer Father 77    Lung Ca  . Diabetes Son   . Breast cancer Neg Hx     Allergies: Review of patient's allergies indicates no known allergies. Current Outpatient Prescriptions on File Prior to Visit  Medication Sig Dispense Refill  . ACCU-CHEK AVIVA PLUS test strip CHECK BLOOD SUGAR TWO TO THREE TIMES A DAY 100 each 11  . ALPRAZolam (XANAX) 0.5 MG tablet Take 0.5 mg by mouth 3 (three) times daily as needed for anxiety.    . AMITIZA 24 MCG capsule TAKE ONE CAPSULE BY MOUTH TWICE DAILY WITH A MEAL 60 capsule 2  . amoxicillin (AMOXIL) 500 MG capsule Take 1 capsule (500  mg total) by mouth 2 (two) times daily. 14 capsule 0  . aspirin 81 MG chewable tablet Chew 81 mg by mouth daily.    Marland Kitchen atorvastatin (LIPITOR) 20 MG tablet Take 20 mg by mouth daily.    . B-D ULTRAFINE III SHORT PEN 31G X 8 MM MISC TEST BLOOD SUGAR TWICE DAILY 100 each 0  . benzonatate (TESSALON) 100 MG capsule Take 1 capsule (100 mg total) by mouth 2 (two) times daily as needed for cough. 20 capsule 0  . Cholecalciferol (VITAMIN D3) 2000 UNITS TABS Take 1 tablet by mouth daily.     . clopidogrel (PLAVIX) 75 MG tablet Take 75 mg by mouth daily.    . clopidogrel (PLAVIX) 75 MG tablet TAKE 1 TABLET BY MOUTH EVERY DAY 90 tablet 0  . DIGOX 125 MCG tablet TAKE 1 TABLET BY MOUTH EVERY DAY 90 tablet 2  . FLUVIRIN SUSP     . gabapentin (NEURONTIN) 600 MG tablet Take 600 mg by mouth 3 (three) times daily.    Marland Kitchen HUMALOG  MIX 75/25 KWIKPEN (75-25) 100 UNIT/ML Kwikpen INJECT 30 UNITS UNDER THE SKIN EVERY MORNING AND 36 UNITS EVERY EVENING 30 mL 3  . Insulin Detemir (LEVEMIR FLEXTOUCH) 100 UNIT/ML Pen Inject 30 Units into the skin daily at 10 pm.     . Lactulose 20 GM/30ML SOLN 30 ml every 4 hours until constipation is relieved 236 mL 3  . LEVEMIR FLEXTOUCH 100 UNIT/ML Pen INJECT 40 UNITS UNDER THE SKIN EVERY DAY AT 10 PM 15 mL 0  . lisinopril (PRINIVIL,ZESTRIL) 20 MG tablet Take 1 tablet (20 mg total) by mouth daily. 90 tablet 1  . LORazepam (ATIVAN) 0.5 MG tablet Take 1 tablet (0.5 mg total) by mouth at bedtime. May repeat in one hour if needed 60 tablet 1  . ofloxacin (FLOXIN) 0.3 % otic solution 10 gtts daily in affected ear(s) for 7 days. 4 mL 0  . traMADol (ULTRAM) 50 MG tablet Take 1 tablet (50 mg total) by mouth every 8 (eight) hours as needed. 30 tablet 0   No current facility-administered medications on file prior to visit.     Social History  Substance Use Topics  . Smoking status: Former Smoker    Packs/day: 2.00    Years: 35.00    Types: Cigarettes    Quit date: 03/30/2011  . Smokeless tobacco: Never Used  . Alcohol use No    Review of Systems  Constitutional: Negative for chills and fever.  HENT: Positive for congestion.   Respiratory: Positive for cough.   Cardiovascular: Negative for chest pain and palpitations.  Gastrointestinal: Negative for nausea and vomiting.  Genitourinary: Positive for dysuria. Negative for decreased urine volume, flank pain, hematuria and vaginal discharge.      Objective:    BP (!) 156/68   Pulse 75   Temp 97.6 F (36.4 C) (Oral)   Ht 5' 6.5" (1.689 m)   Wt 171 lb 9.6 oz (77.8 kg)   SpO2 95%   BMI 27.28 kg/m    Physical Exam  Constitutional: She appears well-developed and well-nourished.  Cardiovascular: Normal rate, regular rhythm, normal heart sounds and normal pulses.   Pulmonary/Chest: Effort normal and breath sounds normal. She has no wheezes.  She has no rhonchi. She has no rales.  Abdominal: There is no CVA tenderness.  Neurological: She is alert.  Skin: Skin is warm and dry.  Psychiatric: She has a normal mood and affect. Her speech is normal and behavior is  normal. Thought content normal.  Vitals reviewed.      Assessment & Plan:  1. Dysuria UA negative for nitrites, blood, leukocytes. Patient and I jointly agreed to wait on urine culture prior to starting treatment. Patient has history of renal failure. She is no decreased urine today, flank pain or fever. She will stay vigilant and let me know if symptoms worsen or new symptoms present.  - POCT Urinalysis Dipstick - Urine culture  2. Cough Afebrile. No adventitious lung sounds to suggest volume overload. Patient and I discussed  likely she has a post viral cough. She did have some improvement with amoxicillin which is reassuring. Advised conservative treatment with over-the-counter cough syrup, honey at bedtime.     I am having Ms. Cudmore maintain her Vitamin D3, aspirin, lisinopril, traMADol, ALPRAZolam, atorvastatin, clopidogrel, gabapentin, Insulin Detemir, ACCU-CHEK AVIVA PLUS, Lactulose, DIGOX, HUMALOG MIX 75/25 KWIKPEN, LORazepam, clopidogrel, B-D ULTRAFINE III SHORT PEN, FLUVIRIN, ofloxacin, amoxicillin, benzonatate, AMITIZA, and LEVEMIR FLEXTOUCH.   No orders of the defined types were placed in this encounter.   Return precautions given.   Risks, benefits, and alternatives of the medications and treatment plan prescribed today were discussed, and patient expressed understanding.   Education regarding symptom management and diagnosis given to patient on AVS.  Continue to follow with [name], [name], MD for routine health maintenance.   [name] and I agreed with plan.   Mable Paris, FNP

## 2016-04-09 NOTE — Patient Instructions (Addendum)
BP log.   Goal < 140/90.   Presume post viral cough; please stay vigilant and doesn't improve let me know.   Will let you know about urine culture. Lots of water :)

## 2016-04-10 LAB — URINE CULTURE

## 2016-04-25 DIAGNOSIS — Q6689 Other  specified congenital deformities of feet: Secondary | ICD-10-CM | POA: Diagnosis not present

## 2016-04-25 DIAGNOSIS — M2040 Other hammer toe(s) (acquired), unspecified foot: Secondary | ICD-10-CM | POA: Diagnosis not present

## 2016-04-30 ENCOUNTER — Ambulatory Visit (INDEPENDENT_AMBULATORY_CARE_PROVIDER_SITE_OTHER): Payer: Medicare Other | Admitting: Family

## 2016-04-30 ENCOUNTER — Encounter: Payer: Self-pay | Admitting: Family

## 2016-04-30 VITALS — BP 110/60 | HR 87 | Temp 98.1°F | Wt 168.6 lb

## 2016-04-30 DIAGNOSIS — T300 Burn of unspecified body region, unspecified degree: Secondary | ICD-10-CM

## 2016-04-30 MED ORDER — SILVER SULFADIAZINE 1 % EX CREA: 1.0000 "application " | TOPICAL_CREAM | Freq: Every day | CUTANEOUS | 0 refills | Status: DC

## 2016-04-30 NOTE — Progress Notes (Signed)
Pre visit review using our clinic review tool, if applicable. No additional management support is needed unless otherwise documented below in the visit note. 
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
## 2016-04-30 NOTE — Patient Instructions (Signed)
Silvadene cream Keep covered when out and about to minimize risk of infeciton  Stay vigilant for signs of infection  If there is no improvement in your symptoms, or if there is any worsening of symptoms, or if you have any additional concerns, please return for re-evaluation; or, if we are closed, consider going to the Emergency Room for evaluation if symptoms urgent.

## 2016-05-05 ENCOUNTER — Other Ambulatory Visit: Payer: Self-pay | Admitting: Internal Medicine

## 2016-05-05 DIAGNOSIS — Z1231 Encounter for screening mammogram for malignant neoplasm of breast: Secondary | ICD-10-CM

## 2016-05-06 ENCOUNTER — Other Ambulatory Visit: Payer: Self-pay | Admitting: Internal Medicine
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
## 2016-05-07 ENCOUNTER — Telehealth: Payer: Self-pay | Admitting: *Deleted

## 2016-05-07 MED ORDER — TRAMADOL HCL 50 MG PO TABS: 50.0000 mg | ORAL_TABLET | Freq: Three times a day (TID) | ORAL | 5 refills | Status: DC | PRN

## 2016-05-07 NOTE — Telephone Encounter (Signed)
Refill request for Tramadol, last seen, [id number] last filled [id number].  Please advise.

## 2016-05-07 NOTE — Telephone Encounter (Signed)
refilled 

## 2016-05-07 NOTE — Telephone Encounter (Signed)
Pt requested tramadol [facility]

## 2016-05-07 NOTE — Telephone Encounter (Signed)
RX has been faxed. 05/07/16

## 2016-05-07 NOTE — Telephone Encounter (Signed)
Rx faxed

## 2016-05-09 ENCOUNTER — Telehealth: Payer: Self-pay | Admitting: Internal Medicine

## 2016-05-09 DIAGNOSIS — L03116 Cellulitis of left lower limb: Secondary | ICD-10-CM | POA: Diagnosis not present

## 2016-05-09 DIAGNOSIS — T24122D Burn of first degree of left knee, subsequent encounter: Secondary | ICD-10-CM | POA: Diagnosis not present
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
## 2016-05-09 NOTE — Telephone Encounter (Signed)
Attempted to reach the patient, left a VM.  

## 2016-05-12 ENCOUNTER — Other Ambulatory Visit: Payer: Self-pay | Admitting: Internal Medicine

## 2016-05-12 NOTE — Telephone Encounter (Signed)
Patient was advised to go to urgent care on Friday.  Thanks

## 2016-05-12 NOTE — Telephone Encounter (Signed)
Refilled for 30 days 

## 2016-05-12 NOTE — Telephone Encounter (Signed)
Last filled 04/15/16 Last OV 02/19/16 Next office visit 05/21/16

## 2016-05-13 ENCOUNTER — Encounter: Payer: Self-pay | Admitting: Family

## 2016-05-13 ENCOUNTER — Ambulatory Visit (INDEPENDENT_AMBULATORY_CARE_PROVIDER_SITE_OTHER): Payer: Medicare Other | Admitting: Family

## 2016-05-13 VITALS — BP 130/90 | HR 78 | Temp 98.1°F | Ht 66.5 in | Wt 176.8 lb

## 2016-05-13 DIAGNOSIS — M7989 Other specified soft tissue disorders: Secondary | ICD-10-CM | POA: Diagnosis not present
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
## 2016-05-13 NOTE — Progress Notes (Signed)
Pre visit review using our clinic review tool, if applicable. No additional management support is needed unless otherwise documented below in the visit note. 

## 2016-05-13 NOTE — Patient Instructions (Signed)
Low salt Compression stockings to knee or higher Elevate legs   If there is no improvement in your symptoms, or if there is any worsening of symptoms, or if you have any additional concerns, please return for re-evaluation; or, if we are closed, consider going to the Emergency Room for evaluation if symptoms urgent.  Peripheral Edema You have swelling in your legs (peripheral edema). This swelling is due to excess accumulation of salt and water in your body. Edema may be a sign of heart, kidney or liver disease, or a side effect of a medication. It may also be due to problems in the leg veins. Elevating your legs and using special support stockings may be very helpful, if the cause of the swelling is due to poor venous circulation. Avoid long periods of standing, whatever the cause. Treatment of edema depends on identifying the cause. Chips, pretzels, pickles and other salty foods should be avoided. Restricting salt in your diet is almost always needed. Water pills (diuretics) are often used to remove the excess salt and water from your body via urine. These medicines prevent the kidney from reabsorbing sodium. This increases urine flow. Diuretic treatment may also result in lowering of potassium levels in your body. Potassium supplements may be needed if you have to use diuretics daily. Daily weights can help you keep track of your progress in clearing your edema. You should call your caregiver for follow up care as recommended. SEEK IMMEDIATE MEDICAL CARE IF:   You have increased swelling, pain, redness, or heat in your legs.  You develop shortness of breath, especially when lying down.  You develop chest or abdominal pain, weakness, or fainting.  You have a fever.   This information is not intended to replace advice given to you by your health care provider. Make sure you discuss any questions you have with your health care provider.   Document Released: 07/31/2004 Document Revised:  09/15/2011 Document Reviewed: 01/03/2015 Elsevier Interactive Patient Education [employer].

## 2016-05-13 NOTE — Assessment & Plan Note (Addendum)
Improving. No signs of infection. Patient and I jointly agreed on conservative management including leg elevation, compression stockings, low-salt diet. Advised socks are exacerbating and she should wear socks to knee or higher. She will follow-up with PCP.

## 2016-05-14 ENCOUNTER — Telehealth: Payer: Self-pay | Admitting: Internal Medicine

## 2016-05-14 NOTE — Telephone Encounter (Signed)
Pt informed

## 2016-05-14 NOTE — Telephone Encounter (Signed)
Pt called and stated that [facility] will not fill her script for compression stocking and neither will [facility]. Do you know of anywhere else that she could get it? If not could we prescribe a water pill. Please advise, thank you!  Call pt @ [phone]

## 2016-05-14 NOTE — Telephone Encounter (Signed)
Laura Reed in [place] will.

## 2016-05-21 ENCOUNTER — Ambulatory Visit (INDEPENDENT_AMBULATORY_CARE_PROVIDER_SITE_OTHER): Payer: Medicare Other | Admitting: Internal Medicine

## 2016-05-21 ENCOUNTER — Encounter: Payer: Self-pay | Admitting: Internal Medicine

## 2016-05-21 VITALS — BP 180/80 | HR 79 | Temp 97.9°F | Resp 12 | Ht 67.0 in | Wt 172.5 lb

## 2016-05-21 DIAGNOSIS — F5105 Insomnia due to other mental disorder: Secondary | ICD-10-CM

## 2016-05-21 DIAGNOSIS — T3111 Burns involving 10-19% of body surface with 10-19% third degree burns: Secondary | ICD-10-CM

## 2016-05-21 DIAGNOSIS — I10 Essential (primary) hypertension: Secondary | ICD-10-CM | POA: Diagnosis not present

## 2016-05-21 DIAGNOSIS — E1142 Type 2 diabetes mellitus with diabetic polyneuropathy: Secondary | ICD-10-CM | POA: Diagnosis not present

## 2016-05-21 DIAGNOSIS — E1151 Type 2 diabetes mellitus with diabetic peripheral angiopathy without gangrene: Secondary | ICD-10-CM | POA: Diagnosis not present

## 2016-05-21 DIAGNOSIS — F419 Anxiety disorder, unspecified: Secondary | ICD-10-CM

## 2016-05-21 LAB — COMPREHENSIVE METABOLIC PANEL
ALT: 29 U/L (ref 0–35)
AST: 16 U/L (ref 0–37)
Albumin: 3.9 g/dL (ref 3.5–5.2)
Alkaline Phosphatase: 105 U/L (ref 39–117)
BUN: 27 mg/dL — AB (ref 6–23)
CHLORIDE: 104 meq/L (ref 96–112)
CO2: 27 meq/L (ref 19–32)
CREATININE: 0.98 mg/dL (ref 0.40–1.20)
Calcium: 9.2 mg/dL (ref 8.4–10.5)
GFR: 62.02 mL/min (ref >60.00)
Glucose, Bld: 210 mg/dL — ABNORMAL HIGH (ref 70–99)
POTASSIUM: 5.1 meq/L (ref 3.5–5.1)
SODIUM: 139 meq/L (ref 135–145)
Total Bilirubin: 0.4 mg/dL (ref 0.2–1.2)
Total Protein: 7 g/dL (ref 6.0–8.3)

## 2016-05-21 LAB — HEMOGLOBIN A1C: Hgb A1c MFr Bld: 6.8 % — ABNORMAL HIGH (ref 4.6–6.5)

## 2016-05-21 MED ORDER — LISINOPRIL-HYDROCHLOROTHIAZIDE 20-25 MG PO TABS: 1.0000 | ORAL_TABLET | Freq: Every day | ORAL | 1 refills | Status: DC

## 2016-05-21 MED ORDER — HYDROCHLOROTHIAZIDE 25 MG PO TABS: 25.0000 mg | ORAL_TABLET | Freq: Every day | ORAL | 1 refills | Status: DC
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
## 2016-05-21 NOTE — Progress Notes (Signed)
Pre-visit discussion using our clinic review tool. No additional management support is needed unless otherwise documented below in the visit note.  

## 2016-05-24 ENCOUNTER — Encounter: Payer: Self-pay | Admitting: Internal Medicine

## 2016-05-24 DIAGNOSIS — T3111 Burns involving 10-19% of body surface with 10-19% third degree burns: Secondary | ICD-10-CM | POA: Insufficient documentation
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
## 2016-05-24 NOTE — Assessment & Plan Note (Signed)
Not controlled.  Adding HCTZ to lisinopril  Lab Results  Component Value Date   NA 139 05/21/2016   K 5.1 05/21/2016   CL 104 05/21/2016   CO2 27 05/21/2016   Lab Results  Component Value Date   CREATININE 0.98 05/21/2016

## 2016-05-24 NOTE — Assessment & Plan Note (Signed)
Managed with alprazolam prn . The risks and benefits of benzodiazepine use were reviewed with patient today including excessive sedation leading to respiratory depression,  impaired thinking/driving, and addiction.  Patient was advised to avoid concurrent use with alcohol, to use medication only as needed and not to share with others  .

## 2016-05-24 NOTE — Assessment & Plan Note (Signed)
Currently well-controlled on current medications .  hemoglobin A1c is at goal  . Patient is  Up to date on  annual eye exams and foot exam was deferred since she saw podiatry earlier in the week. . Patient has minimal microalbuminuria. Patient is tolerating statin therapy for CAD risk reduction and on ACE for renal protection and hypertension .  Lab Results  Component Value Date   HGBA1C 6.8 (H) 05/21/2016   Lab Results  Component Value Date   MICROALBUR 2.9 (H) 02/18/2016
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
## 2016-05-28 ENCOUNTER — Ambulatory Visit (INDEPENDENT_AMBULATORY_CARE_PROVIDER_SITE_OTHER): Payer: Medicare Other

## 2016-05-28 VITALS — BP 122/64 | HR 75 | Resp 16

## 2016-05-28 DIAGNOSIS — I10 Essential (primary) hypertension: Secondary | ICD-10-CM

## 2016-05-28 DIAGNOSIS — T3111 Burns involving 10-19% of body surface with 10-19% third degree burns: Secondary | ICD-10-CM | POA: Diagnosis not present
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
## 2016-06-02 ENCOUNTER — Ambulatory Visit (INDEPENDENT_AMBULATORY_CARE_PROVIDER_SITE_OTHER): Payer: Medicare Other | Admitting: Internal Medicine

## 2016-06-02 ENCOUNTER — Encounter: Payer: Self-pay | Admitting: Internal Medicine

## 2016-06-02 VITALS — BP 122/60 | HR 74 | Temp 98.1°F | Resp 12 | Ht 67.0 in | Wt 172.8 lb

## 2016-06-02 DIAGNOSIS — T3111 Burns involving 10-19% of body surface with 10-19% third degree burns: Secondary | ICD-10-CM | POA: Diagnosis not present

## 2016-06-02 MED ORDER — LIDOCAINE-EPINEPHRINE 2 %-1:100000 IJ SOLN: 1.7000 mL | Freq: Once | INTRAMUSCULAR | Status: DC

## 2016-06-02 NOTE — Progress Notes (Signed)
Pre-visit discussion using our clinic review tool. No additional management support is needed unless otherwise documented below in the visit note.  

## 2016-06-02 NOTE — Patient Instructions (Signed)
I debrided your wound today of slough.  Keep the wound covered with non stick Telfa and change the dressing once daily.  You can irrigate the wound with sterile saline  Whenever you change the dressing   No more petroleum jelly,  No peroxide
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
## 2016-06-03 ENCOUNTER — Other Ambulatory Visit: Payer: Self-pay | Admitting: Internal Medicine

## 2016-06-03 DIAGNOSIS — L02222 Furuncle of back [any part, except buttock]: Secondary | ICD-10-CM

## 2016-06-03 NOTE — Progress Notes (Signed)
  I have reviewed the above information and agree with above.   Anav Lammert, MD 

## 2016-06-03 NOTE — Assessment & Plan Note (Signed)
Informed consent obtained for debridement of wound .  Area was cleaned with betadine and alcohol. Area was infiltrated with intradermal xylocaine with ep .  ,  Wound bed was debrided of devitalized skin and connective tissue down to a bleeding layer of granulating epithelium. Pressure bandage applied with nonstick Telfa, Kurlex and tape.  Advised to change dressing daily.  RTC one week

## 2016-06-04 ENCOUNTER — Telehealth: Payer: Self-pay | Admitting: General Surgery
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
## 2016-06-05 ENCOUNTER — Other Ambulatory Visit: Payer: Self-pay | Admitting: Internal Medicine

## 2016-06-05 ENCOUNTER — Encounter: Payer: Self-pay | Admitting: *Deleted

## 2016-06-09 ENCOUNTER — Ambulatory Visit (INDEPENDENT_AMBULATORY_CARE_PROVIDER_SITE_OTHER): Payer: Medicare Other | Admitting: Internal Medicine

## 2016-06-09 ENCOUNTER — Encounter: Payer: Self-pay | Admitting: Internal Medicine

## 2016-06-09 ENCOUNTER — Ambulatory Visit: Payer: Medicare Other

## 2016-06-09 DIAGNOSIS — T3111 Burns involving 10-19% of body surface with 10-19% third degree burns: Secondary | ICD-10-CM | POA: Diagnosis not present

## 2016-06-09 NOTE — Patient Instructions (Addendum)
Continue to keep wound covered,  Change dressing daily'   Stop Plavix next Thursday and return on Monday for more debridement possibly

## 2016-06-09 NOTE — Progress Notes (Signed)
Pre-visit discussion using our clinic review tool. No additional management support is needed unless otherwise documented below in the visit note.  

## 2016-06-10 DIAGNOSIS — E1140 Type 2 diabetes mellitus with diabetic neuropathy, unspecified: Secondary | ICD-10-CM | POA: Diagnosis not present

## 2016-06-10 DIAGNOSIS — L851 Acquired keratosis [keratoderma] palmaris et plantaris: Secondary | ICD-10-CM | POA: Diagnosis not present

## 2016-06-10 DIAGNOSIS — Z794 Long term (current) use of insulin: Secondary | ICD-10-CM | POA: Diagnosis not present

## 2016-06-10 DIAGNOSIS — B351 Tinea unguium: Secondary | ICD-10-CM | POA: Diagnosis not present
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
## 2016-06-10 NOTE — Assessment & Plan Note (Signed)
The wound is improving and smaller in size.  Informed consent obtained for debridement of wound .  Area was cleaned with betadine and alcohol. Area was infiltrated with intradermal xylocaine with ep .  ,  Wound bed was debrided of slough and connective tissue down to a bleeding layer of granulating epithelium. Pressure bandage applied with nonstick Telfa, Kerlex and tape.  Advised to change dressing daily.  RTC one week

## 2016-06-16 ENCOUNTER — Ambulatory Visit (INDEPENDENT_AMBULATORY_CARE_PROVIDER_SITE_OTHER): Payer: Medicare Other | Admitting: Internal Medicine

## 2016-06-16 ENCOUNTER — Telehealth: Payer: Self-pay | Admitting: *Deleted

## 2016-06-16 DIAGNOSIS — T3111 Burns involving 10-19% of body surface with 10-19% third degree burns: Secondary | ICD-10-CM | POA: Diagnosis not present

## 2016-06-16 DIAGNOSIS — H60542 Acute eczematoid otitis externa, left ear: Secondary | ICD-10-CM

## 2016-06-16 MED ORDER — NYSTATIN-TRIAMCINOLONE 100000-0.1 UNIT/GM-% EX OINT
1.0000 | TOPICAL_OINTMENT | Freq: Two times a day (BID) | CUTANEOUS | 0 refills | Status: DC
Start: 2016-06-16 — End: 2016-12-09

## 2016-06-16 NOTE — Progress Notes (Signed)
Pre visit review using our clinic review tool, if applicable. No additional management support is needed unless otherwise documented below in the visit note. 

## 2016-06-16 NOTE — Patient Instructions (Signed)
I am prescribing nystatin/triamcinolone ointment to use on your left ear twice daily until you see the ear doctor  I will see you back the week after Christmas
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
## 2016-06-16 NOTE — Telephone Encounter (Signed)
Please give a time and date to place patient in two weeks  She was last  seen on 12/11  Pt contact [phone]

## 2016-06-17 ENCOUNTER — Ambulatory Visit (INDEPENDENT_AMBULATORY_CARE_PROVIDER_SITE_OTHER): Payer: Medicare Other | Admitting: General Surgery

## 2016-06-17 ENCOUNTER — Encounter: Payer: Self-pay | Admitting: General Surgery

## 2016-06-17 VITALS — BP 96/56 | HR 74 | Resp 14 | Ht 67.0 in | Wt 168.0 lb

## 2016-06-17 DIAGNOSIS — L720 Epidermal cyst: Secondary | ICD-10-CM | POA: Diagnosis not present

## 2016-06-17 DIAGNOSIS — H6092 Unspecified otitis externa, left ear: Secondary | ICD-10-CM | POA: Insufficient documentation

## 2016-06-17 DIAGNOSIS — L723 Sebaceous cyst: Secondary | ICD-10-CM | POA: Diagnosis not present

## 2016-06-17 NOTE — Assessment & Plan Note (Signed)
Wound is improving. Wound bed is covered in granulation tissue and slough.  No signs of infection or contraction despite location over knee joint. Minimal debridement of slough done today,  Continue to keep wound covered. return in 2 weeks

## 2016-06-17 NOTE — Patient Instructions (Addendum)
The patient is aware to call back for any questions or concerns. Keep area clean. One week for niurse

## 2016-06-17 NOTE — Telephone Encounter (Signed)
4.30 on the 27th please schedule.

## 2016-06-17 NOTE — Assessment & Plan Note (Signed)
Unclear if eczematous or mycotic.  HAS ENt follow up soon.  Trial of nystatin/triamcinolone to manage itching.

## 2016-06-17 NOTE — Progress Notes (Signed)
Patient ID: AMERIAH DEBATES, female   DOB: 12-22-1960, 55 y.o.   MRN: [id number]  Chief Complaint  Patient presents with  . Other    cyst on back    HPI Laura Reed is a 55 y.o. female here today for a evaluation of a 2 cyst on back. Patient states that they seem to itching a lot more over the past 6 months.    HPI  Past Medical History:  Diagnosis Date  . Cancer ([place])   . CHF (congestive heart failure) ([place])   . Coronary artery disease   . Diabetes mellitus   . Heart murmur   . Hemorrhoid   . Hypertension   . PVD (peripheral vascular disease) ([place])   . Urothelial carcinoma of kidney ([place]) 10/31/2014   INVASIVE UROTHELIAL CARCINOMA, LOW GRADE. T1, Nx.    Past Surgical History:  Procedure Laterality Date  . AMPUTATION TOE    . ARTERIAL BYPASS SURGRY  2009, 2013 x 2   right leg , done in [place]  . CAROTID ENDARTERECTOMY  July 2015   Dr Delana Meyer  . CATARACT EXTRACTION W/PHACO Right 12/14/2014   Procedure: CATARACT EXTRACTION PHACO AND INTRAOCULAR LENS PLACEMENT (IOC);  Surgeon: Lyla Glassing, MD;  Location: [facility] ORS;  Service: Ophthalmology;  Laterality: Right;  [place]   00:38.6              AP        7.1                   CDE  2.76  . CESAREAN SECTION    . CHOLECYSTECTOMY  03-03-12   Porcelain gallbladder, gallstones,  Bode Pieper  . COLONOSCOPY W/ BIOPSIES  04/28/2012   Hyperplastic rectal polyps.  . CORONARY ARTERY BYPASS GRAFT  2009   3 vessel  . HERNIA REPAIR  10-31-14   ventral, retro-rectus atrium mesh  . NEPHRECTOMY Left 10-31-14  . PERIPHERAL VASCULAR CATHETERIZATION Left 05/01/2015   Procedure: Lower Extremity Angiography;  Surgeon: Katha Cabal, MD;  Location: [facility];  Service: Cardiovascular;  Laterality: Left;  . PERIPHERAL VASCULAR CATHETERIZATION  05/01/2015   Procedure: Lower Extremity Intervention;  Surgeon: Katha Cabal, MD;  Location: [place] [facility];  Service: Cardiovascular;;  . PERIPHERAL VASCULAR CATHETERIZATION Left 02/20/2015    Procedure: Pelvic Angiography;  Surgeon: Katha Cabal, MD;  Location: [place] [facility];  Service: Cardiovascular;  Laterality: Left;    Family History  Problem Relation Age of Onset  . Cancer Mother 42    Lung Cancer  . Cancer Father 32    Lung Ca  . Diabetes Son   . Breast cancer Neg Hx     Social History Social History  Substance Use Topics  . Smoking status: Former Smoker    Packs/day: 2.00    Years: 35.00    Types: Cigarettes    Quit date: 03/30/2011  . Smokeless tobacco: Never Used  . Alcohol use No    No Known Allergies  Current Outpatient Prescriptions  Medication Sig Dispense Refill  . ACCU-CHEK AVIVA PLUS test strip CHECK BLOOD SUGAR TWO TO THREE TIMES A DAY 100 each 11  . ALPRAZolam (XANAX) 0.5 MG tablet Take 0.5 mg by mouth 3 (three) times daily as needed for anxiety.    . AMITIZA 24 MCG capsule TAKE ONE CAPSULE BY MOUTH TWICE DAILY WITH A MEAL 60 capsule 2  . aspirin 81 MG chewable tablet Chew 81 mg by mouth daily.    Marland Kitchen  atorvastatin (LIPITOR) 20 MG tablet Take 20 mg by mouth daily.    . B-D ULTRAFINE III SHORT PEN 31G X 8 MM MISC TEST BLOOD SUGAR TWICE DAILY 100 each 0  . B-D ULTRAFINE III SHORT PEN 31G X 8 MM MISC TEST BLOOD SUGAR TWICE DAILY 100 each 0  . benzonatate (TESSALON) 100 MG capsule Take 1 capsule (100 mg total) by mouth 2 (two) times daily as needed for cough. 20 capsule 0  . Cholecalciferol (VITAMIN D3) 2000 UNITS TABS Take 1 tablet by mouth daily.     . clopidogrel (PLAVIX) 75 MG tablet Take 75 mg by mouth daily.    Marland Kitchen DIGOX 125 MCG tablet TAKE 1 TABLET BY MOUTH EVERY DAY 90 tablet 2  . FLUVIRIN SUSP     . gabapentin (NEURONTIN) 600 MG tablet Take 600 mg by mouth 3 (three) times daily.    Marland Kitchen HUMALOG MIX 75/25 KWIKPEN (75-25) 100 UNIT/ML Kwikpen INJECT 30 UNITS UNDER THE SKIN EVERY MORNING AND 36 UNITS EVERY EVENING 30 mL 3  . Insulin Detemir (LEVEMIR FLEXTOUCH) 100 UNIT/ML Pen Inject 30 Units into the skin daily at 10 pm.     . Lactulose  20 GM/30ML SOLN 30 ml every 4 hours until constipation is relieved 236 mL 3  . LEVEMIR FLEXTOUCH 100 UNIT/ML Pen INJECT 40 UNITS UNDER THE SKIN EVERY DAY AT 10 PM 15 mL 1  . lisinopril-hydrochlorothiazide (PRINZIDE,ZESTORETIC) 20-25 MG tablet Take 1 tablet by mouth daily. 90 tablet 1  . LORazepam (ATIVAN) 0.5 MG tablet TAKE 1 TABLET BY MOUTH AT BEDTIME, MAY REPEAT IN 1 HOUR IF NEEDED 60 tablet 0  . nystatin-triamcinolone ointment (MYCOLOG) Apply 1 application topically 2 (two) times daily. To left ear 30 g 0  . traMADol (ULTRAM) 50 MG tablet Take 1 tablet (50 mg total) by mouth every 8 (eight) hours as needed. 30 tablet 5  . TRUEPLUS LANCETS 28G MISC CHECK BLOOD SUGAR FOUR TIMES DAILY 200 each 0   Current Facility-Administered Medications  Medication Dose Route Frequency Provider Last Rate Last Dose  . lidocaine-EPINEPHrine (XYLOCAINE W/EPI) 2 %-1:100000 (with pres) injection 1.7 mL  1.7 mL Intradermal Once [name], MD        Review of Systems Review of Systems  Constitutional: Negative.   Respiratory: Negative.   Cardiovascular: Negative.     Blood pressure (!) 96/56, pulse 74, resp. rate 14, height 5\' 7"  (1.702 m), weight 168 lb (76.2 kg).  Physical Exam Physical Exam  Constitutional: She is oriented to person, place, and time. She appears well-developed and well-nourished.  Eyes: Conjunctivae are normal. No scleral icterus.  Neck: Neck supple.  Cardiovascular: Normal rate, regular rhythm and normal heart sounds.   Pulmonary/Chest: Effort normal and breath sounds normal.      Lymphadenopathy:    She has no cervical adenopathy.  Neurological: She is alert and oriented to person, place, and time.  Skin: Skin is warm, dry and intact.  A 1.5 cm sebaceous cyst with redness at T 6-7 and a 5 mm to left of midline at T 4.      Assessment    Inflamed, symptomatic sebaceous cyst right back.    Plan    Based on her history of diabetes in her present symptoms was elected  to proceed to excision. A total of 20 mL of 0.5% Xylocaine with 0.25% Marcaine with 1-200,000 of epinephrine was utilized well tolerated. This was supplemented with 2 mL of 1% plain Xylocaine. ChloraPrep was applied to  the skin. A transverse elliptical incision was used including the central pore. The cyst contents were without odor. The entire cyst wall was removed. The deep tissue was approximated with interrupted 3-0 Vicryl sutures. The skin was closed with interrupted 4-0 horizontal mattress sutures. Telfa and Tegaderm dressing applied.  Intermittent nice application encouraged. OTC medications for pain. Follow-up in one week for suture removal.    Return in one week nurse.   This information has been scribed by Karie Fetch RN, BSN,BC. Marland Kitchen   Robert Bellow 06/17/2016, 7:17 PM

## 2016-06-17 NOTE — Telephone Encounter (Signed)
Pt notified and scheduled.

## 2016-06-19 ENCOUNTER — Ambulatory Visit (INDEPENDENT_AMBULATORY_CARE_PROVIDER_SITE_OTHER): Payer: Self-pay | Admitting: Vascular Surgery

## 2016-06-19 ENCOUNTER — Encounter (INDEPENDENT_AMBULATORY_CARE_PROVIDER_SITE_OTHER): Payer: Self-pay

## 2016-06-21 ENCOUNTER — Other Ambulatory Visit: Payer: Self-pay | Admitting: Internal Medicine

## 2016-06-24 ENCOUNTER — Ambulatory Visit (INDEPENDENT_AMBULATORY_CARE_PROVIDER_SITE_OTHER): Payer: Medicare Other | Admitting: *Deleted

## 2016-06-24 DIAGNOSIS — L723 Sebaceous cyst: Secondary | ICD-10-CM

## 2016-06-24 NOTE — Patient Instructions (Signed)
The patient is aware to call back for any questions or concerns.  

## 2016-06-24 NOTE — Progress Notes (Signed)
Patient ID: Laura Reed, female   DOB: [birth year], 55 y.o.   MRN: [id number]  Patient came in today for a wound check.  The wound is clean, with no signs of infection noted. Follow up as scheduled. Aware of pathology.

## 2016-06-25 DIAGNOSIS — H60332 Swimmer's ear, left ear: Secondary | ICD-10-CM | POA: Diagnosis not present

## 2016-06-25 DIAGNOSIS — H6122 Impacted cerumen, left ear: Secondary | ICD-10-CM | POA: Diagnosis not present

## 2016-07-01 ENCOUNTER — Other Ambulatory Visit: Payer: Self-pay | Admitting: Internal Medicine

## 2016-07-02 ENCOUNTER — Ambulatory Visit (INDEPENDENT_AMBULATORY_CARE_PROVIDER_SITE_OTHER): Payer: Medicare Other | Admitting: Internal Medicine

## 2016-07-02 ENCOUNTER — Encounter: Payer: Self-pay | Admitting: Internal Medicine

## 2016-07-02 VITALS — BP 110/60 | HR 37 | Temp 98.2°F | Resp 12 | Ht 67.0 in | Wt 169.8 lb

## 2016-07-02 DIAGNOSIS — E041 Nontoxic single thyroid nodule: Secondary | ICD-10-CM

## 2016-07-02 DIAGNOSIS — I499 Cardiac arrhythmia, unspecified: Secondary | ICD-10-CM | POA: Diagnosis not present

## 2016-07-02 DIAGNOSIS — T3111 Burns involving 10-19% of body surface with 10-19% third degree burns: Secondary | ICD-10-CM

## 2016-07-02 DIAGNOSIS — H60542 Acute eczematoid otitis externa, left ear: Secondary | ICD-10-CM

## 2016-07-02 DIAGNOSIS — R002 Palpitations: Secondary | ICD-10-CM

## 2016-07-02 DIAGNOSIS — L723 Sebaceous cyst: Secondary | ICD-10-CM
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
## 2016-07-02 NOTE — Patient Instructions (Signed)
Your wound is too dry again.  Use the vaseline with each dressing change to moisten up that scab so it falls off naturally  Your irregular heart rhythm is due to extra beats, called PVC's this can happen with low potassium, low magnesium , which we are checking today    Premature Ventricular Contraction A premature ventricular contraction (PVC) is a common irregularity in the normal heart rhythm. These contractions are extra heartbeats that start in the heart ventricles and occur too early in the normal sequence. During the PVC, the heart's normal electrical pathway is not used, so the beat is shorter and less effective. In most cases, these contractions come and go and do not require treatment. What are the causes? In many cases, the cause may not be known. Common causes of the condition include:  Smoking.  Drinking alcohol.  Caffeine.  Certain medicines.  Some illegal drugs.  Stress. Certain medical conditions can also cause PVCs:  Changes in minerals in the blood (electrolytes).  Heart failure.  Heart valve problems.  Low blood oxygen levels or high carbon dioxide levels.  Heart attack, or coronary artery disease. What are the signs or symptoms? The main symptom of this condition is a fast or skipped heartbeat (palpitations). Other symptoms include:  Chest pain.  Shortness of breath.  Feeling tired.  Dizziness. In some cases, there are no symptoms. How is this diagnosed? This condition may be diagnosed based on:  Your medical history.  A physical exam. During the exam, the health care provider will check for irregular heartbeats.  Tests, such as:  An ECG (electrocardiogram) to monitor the electrical activity of your heart.  Holter monitor testing. This involves wearing a device that clips to your clothing and monitors the electrical activity of your heart over longer periods of time.  Stress tests to see how exercise affects your heart rhythm and blood  supply.  Echocardiogram. This test uses sound waves (ultrasound) to produce an image of your heart.  Electrophysiology study. This test checks the electric pathways in your heart. How is this treated? Treatment depends on any underlying conditions, the type of PVCs that you are having, and how much the symptoms are interfering with your daily life. Possible treatments include:  Avoiding things that can trigger the premature contractions, such as caffeine or alcohol.  Medicines. These may be given if symptoms are severe or if the extra heartbeats are frequent.  Treatment for any underlying condition that is found to be the cause of the contractions.  Catheter ablation. This procedure destroys the heart tissues that send abnormal signals. In some cases, no treatment is required. Follow these instructions at home: Lifestyle Follow these instructions as told by your health care provider:  Do not use any products that contain nicotine or tobacco, such as cigarettes and e-cigarettes. If you need help quitting, ask your health care provider.  If caffeine triggers episodes of PVC, do not eat, drink, or use anything with caffeine in it.  If caffeine does not seem to trigger episodes, consume caffeine in moderation.  If alcohol triggers episodes of PVC, do not drink alcohol.  If alcohol does not seem to trigger episodes, limit alcohol intake to no more than 1 drink a day for nonpregnant women and 2 drinks a day for men. One drink equals 12 oz of beer, 5 oz of wine, or 1 oz of hard liquor.  Exercise regularly. Ask your health care provider what type of exercise is safe for you.  Find  healthy ways to manage stress. Avoid stressful situations when possible.  Try to get at least 7-9 hours of sleep each night, or as much as recommended by your health care provider.  Do not use illegal drugs. General instructions  Take over-the-counter and prescription medicines only as told by your health  care provider.  Keep all follow-up visits as told by your health care provider. This is important. Get help right away if:  You feel palpitations that are frequent or continual.  You have chest pain.  You have shortness of breath.  You have sweating for no reason.  You have nausea and vomiting.  You become light-headed or you faint. This information is not intended to replace advice given to you by your health care provider. Make sure you discuss any questions you have with your health care provider. Document Released: 02/08/2004 Document Revised: 02/15/2016 Document Reviewed: 11/28/2015 Elsevier Interactive Patient Education  2017 [employer].

## 2016-07-03 ENCOUNTER — Encounter: Payer: Self-pay | Admitting: Internal Medicine

## 2016-07-03 LAB — BASIC METABOLIC PANEL
BUN: 31 mg/dL — ABNORMAL HIGH (ref 6–23)
CALCIUM: 9.8 mg/dL (ref 8.4–10.5)
CHLORIDE: 105 meq/L (ref 96–112)
CO2: 29 meq/L (ref 19–32)
Creatinine, Ser: 1.19 mg/dL (ref 0.40–1.20)
GFR: 49.55 mL/min — ABNORMAL LOW (ref >60.00)
Glucose, Bld: 116 mg/dL — ABNORMAL HIGH (ref 70–99)
Potassium: 4.5 mEq/L (ref 3.5–5.1)
SODIUM: 141 meq/L (ref 135–145)

## 2016-07-03 LAB — MAGNESIUM: MAGNESIUM: 2.1 mg/dL (ref 1.5–2.5)

## 2016-07-04 ENCOUNTER — Other Ambulatory Visit: Payer: Self-pay | Admitting: Internal Medicine

## 2016-07-05 ENCOUNTER — Encounter: Payer: Self-pay | Admitting: Internal Medicine

## 2016-07-05 NOTE — Assessment & Plan Note (Signed)
EKG done today shows NSR with PVC's.

## 2016-07-05 NOTE — Assessment & Plan Note (Signed)
Unclear if eczematous or mycotic.  I treated her with nystatin/triamcinolone to manage itching.  She saw chap mcqueen last week and the treatment was changed to mometasone. Symptoms of itching and flaking are improving.

## 2016-07-05 NOTE — Assessment & Plan Note (Signed)
S/p excision of right scapular cyst by dr Bary Castilla.

## 2016-07-05 NOTE — Assessment & Plan Note (Signed)
Continued  surveillance by chap mcqueen with serial [place], which was done in September

## 2016-07-05 NOTE — Assessment & Plan Note (Signed)
Her wound is gradually diminishing,  Currently too dry. Recommended keeping it covered with vaseline and telfa pad.  Follow up in  2 weeks

## 2016-07-16 ENCOUNTER — Ambulatory Visit (INDEPENDENT_AMBULATORY_CARE_PROVIDER_SITE_OTHER): Payer: Medicare Other | Admitting: Internal Medicine

## 2016-07-16 ENCOUNTER — Encounter: Payer: Self-pay | Admitting: Internal Medicine

## 2016-07-16 VITALS — BP 140/80 | HR 69 | Temp 98.0°F | Resp 16 | Ht 67.0 in | Wt 170.0 lb

## 2016-07-16 DIAGNOSIS — E1151 Type 2 diabetes mellitus with diabetic peripheral angiopathy without gangrene: Secondary | ICD-10-CM | POA: Diagnosis not present

## 2016-07-16 DIAGNOSIS — E1142 Type 2 diabetes mellitus with diabetic polyneuropathy: Secondary | ICD-10-CM

## 2016-07-16 DIAGNOSIS — T3111 Burns involving 10-19% of body surface with 10-19% third degree burns: Secondary | ICD-10-CM

## 2016-07-16 DIAGNOSIS — F419 Anxiety disorder, unspecified: Secondary | ICD-10-CM | POA: Diagnosis not present

## 2016-07-16 DIAGNOSIS — E1169 Type 2 diabetes mellitus with other specified complication: Secondary | ICD-10-CM | POA: Diagnosis not present

## 2016-07-16 DIAGNOSIS — F5105 Insomnia due to other mental disorder: Secondary | ICD-10-CM

## 2016-07-16 DIAGNOSIS — E785 Hyperlipidemia, unspecified: Secondary | ICD-10-CM

## 2016-07-16 NOTE — Patient Instructions (Addendum)
Move your lab appt to march 28 (.So your a1c will be lower ) and I'll see you in April   Your wound is almost completely healed!  Keep applying the vaseline until the scab is completely gone  Keep it covered with material (clothes or non stick pads) for another month  Try using 25 mg of generic Benadryl ("dipenhydramine")  or 12. 5 mg ( children's dose if the other is  too strong) one hour before bedtime .  This will help dry up your post nasal drip and help you sleep better.
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
## 2016-07-19 NOTE — Assessment & Plan Note (Signed)
.  Well controlled on current statin therapy.   Liver enzymes are normal , no changes today.  Lab Results  Component Value Date   CHOL 105 02/18/2016   HDL 40.00 02/18/2016   LDLCALC 43 02/18/2016   LDLDIRECT 58.0 07/31/2015   TRIG 110.0 02/18/2016   CHOLHDL 3 02/18/2016   Lab Results  Component Value Date   ALT 29 05/21/2016   AST 16 05/21/2016   ALKPHOS 105 05/21/2016   BILITOT 0.4 05/21/2016

## 2016-07-19 NOTE — Assessment & Plan Note (Addendum)
No longer using  alprazolam prn .recommended trial of benadryl given concurrent rhinitis and PND<

## 2016-07-19 NOTE — Assessment & Plan Note (Signed)
Currently well-controlled on current medications .  hemoglobin A1c is at goal  . Patient is  Up to date on  annual eye exams and foot exam was deferred since she saw podiatry earlier in the week. . Patient has minimal microalbuminuria. Patient is tolerating statin therapy for CAD risk reduction and on ACE for renal protection and hypertension .  Lab Results  Component Value Date   HGBA1C 6.8 (H) 05/21/2016   Lab Results  Component Value Date   MICROALBUR 2.9 (H) 02/18/2016

## 2016-07-31 DIAGNOSIS — M2040 Other hammer toe(s) (acquired), unspecified foot: Secondary | ICD-10-CM | POA: Diagnosis not present

## 2016-07-31 DIAGNOSIS — L851 Acquired keratosis [keratoderma] palmaris et plantaris: Secondary | ICD-10-CM | POA: Diagnosis not present

## 2016-08-11 ENCOUNTER — Other Ambulatory Visit: Payer: Self-pay | Admitting: Internal Medicine

## 2016-08-15 ENCOUNTER — Encounter: Payer: Self-pay | Admitting: Urology

## 2016-08-15 ENCOUNTER — Ambulatory Visit (INDEPENDENT_AMBULATORY_CARE_PROVIDER_SITE_OTHER): Payer: Medicare Other | Admitting: Urology

## 2016-08-15 VITALS — BP 123/73 | HR 69 | Ht 67.0 in | Wt 168.6 lb

## 2016-08-15 DIAGNOSIS — Z08 Encounter for follow-up examination after completed treatment for malignant neoplasm: Secondary | ICD-10-CM | POA: Diagnosis not present

## 2016-08-15 DIAGNOSIS — Z8550 Personal history of malignant neoplasm of unspecified urinary tract organ: Secondary | ICD-10-CM

## 2016-08-15 DIAGNOSIS — C649 Malignant neoplasm of unspecified kidney, except renal pelvis: Secondary | ICD-10-CM | POA: Diagnosis not present

## 2016-08-15 LAB — URINALYSIS, COMPLETE
Bilirubin, UA: NEGATIVE
GLUCOSE, UA: NEGATIVE
KETONES UA: NEGATIVE
LEUKOCYTES UA: NEGATIVE
Nitrite, UA: NEGATIVE
Protein, UA: NEGATIVE
SPEC GRAV UA: 1.015 (ref 1.005–1.030)
Urobilinogen, Ur: 0.2 mg/dL (ref 0.2–1.0)
pH, UA: 5 (ref 5.0–7.5)

## 2016-08-15 LAB — MICROSCOPIC EXAMINATION: Bacteria, UA: NONE SEEN

## 2016-08-15 MED ORDER — LIDOCAINE HCL 2 % EX GEL
1.0000 "application " | Freq: Once | CUTANEOUS | Status: AC
  Administered 2016-08-15: 1 via URETHRAL

## 2016-08-15 MED ORDER — CIPROFLOXACIN HCL 500 MG PO TABS
500.0000 mg | ORAL_TABLET | Freq: Once | ORAL | Status: AC
  Administered 2016-08-15: 500 mg via ORAL

## 2016-08-15 NOTE — Progress Notes (Signed)
10:37 AM  08/15/16  Laura Reed 1961/04/12 [id number]  Referring provider: [name], MD [address] [place], [place] [address]  Chief Complaint  Patient presents with  . Cysto    HPI:  1 - Left Upper Tract Transitional Cell Carcinoma - s/p LEFT robotic nephroureterectomy on 10/31/2014 as well as combined ventral hernia repair with Dr. Bary Castilla. Post complicated by wound infection now well healed. Pathology  pT1N0 with negative margins.  Recent Surveillance: 02/2015 - cysto NED; 05/2015 cysto NED, 08/2015- cysto NED, 11/2015- NED, CT Urogram 11/12/15 negative, poor quality of delayed phase.; 5/2-17- cysto with mild erythema following recent UTI; 02/2016 NED   2 - Solitary Kidney  - s/p left nephrecotmy as per above. Most recent Cr 1.19 06/2016.   She does have an extensive smoking history, quit 5 years ago but smoked up to 2 packs a day for 35 years. She also has multiple medical comorbidities including history of diabetes, CAD status post CABG, carotid endarterectomy.    She presents today for routine cystoscopy.     PMH: Past Medical History:  Diagnosis Date  . Cancer ([place])   . CHF (congestive heart failure) ([place])   . Coronary artery disease   . Diabetes mellitus   . Heart murmur   . Hemorrhoid   . Hypertension   . PVD (peripheral vascular disease) ([place])   . Urothelial carcinoma of kidney ([place]) 10/31/2014   INVASIVE UROTHELIAL CARCINOMA, LOW GRADE. T1, Nx.    Surgical History: Past Surgical History:  Procedure Laterality Date  . AMPUTATION TOE    . ARTERIAL BYPASS SURGRY  2009, 2013 x 2   right leg , done in [place]  . CAROTID ENDARTERECTOMY  July 2015   Dr Delana Meyer  . CATARACT EXTRACTION W/PHACO Right 12/14/2014   Procedure: CATARACT EXTRACTION PHACO AND INTRAOCULAR LENS PLACEMENT (IOC);  Surgeon: Lyla Glassing, MD;  Location: [facility];  Service: Ophthalmology;  Laterality: Right;  [place]   00:38.6              AP        7.1                    CDE  2.76  . CESAREAN SECTION    . CHOLECYSTECTOMY  03-03-12   Porcelain gallbladder, gallstones,  [place]  . COLONOSCOPY W/ BIOPSIES  04/28/2012   Hyperplastic rectal polyps.  . CORONARY ARTERY BYPASS GRAFT  2009   3 vessel  . HERNIA REPAIR  10-31-14   ventral, retro-rectus atrium mesh  . NEPHRECTOMY Left 10-31-14  . PERIPHERAL VASCULAR CATHETERIZATION Left 05/01/2015   Procedure: Lower Extremity Angiography;  Surgeon: Katha Cabal, MD;  Location: [place] [facility];  Service: Cardiovascular;  Laterality: Left;  . PERIPHERAL VASCULAR CATHETERIZATION  05/01/2015   Procedure: Lower Extremity Intervention;  Surgeon: Katha Cabal, MD;  Location: [place] [facility];  Service: Cardiovascular;;  . PERIPHERAL VASCULAR CATHETERIZATION Left 02/20/2015   Procedure: Pelvic Angiography;  Surgeon: Katha Cabal, MD;  Location: [place] [facility];  Service: Cardiovascular;  Laterality: Left;    Home Medications:  Allergies as of 08/15/2016   No Known Allergies     Medication List       Accurate as of 08/15/16 10:37 AM. Always use your most recent med list.          ACCU-CHEK AVIVA PLUS test strip Generic drug:  glucose blood CHECK BLOOD SUGAR TWO TO THREE TIMES A  DAY   AMITIZA 24 MCG capsule Generic drug:  lubiprostone TAKE ONE CAPSULE BY MOUTH TWICE DAILY WITH A MEAL   aspirin 81 MG chewable tablet Chew 81 mg by mouth daily.   atorvastatin 20 MG tablet Commonly known as:  LIPITOR Take 20 mg by mouth daily.   B-D ULTRAFINE III SHORT PEN 31G X 8 MM Misc Generic drug:  Insulin Pen Needle TEST BLOOD SUGAR TWICE DAILY   B-D ULTRAFINE III SHORT PEN 31G X 8 MM Misc Generic drug:  Insulin Pen Needle TEST BLOOD SUGAR TWICE DAILY   benzonatate 100 MG capsule Commonly known as:  TESSALON Take 1 capsule (100 mg total) by mouth 2 (two) times daily as needed for cough.   clopidogrel 75 MG tablet Commonly known as:  PLAVIX Take 75 mg by mouth daily.   clopidogrel 75 MG  tablet Commonly known as:  PLAVIX TAKE 1 TABLET BY MOUTH EVERY DAY   DIGOX 0.125 MG tablet Generic drug:  digoxin TAKE 1 TABLET BY MOUTH EVERY DAY   FLUVIRIN Susp Generic drug:  Influenza Vac Typ A&B Surf Ant   gabapentin 600 MG tablet Commonly known as:  NEURONTIN Take 600 mg by mouth 3 (three) times daily.   HUMALOG MIX 75/25 KWIKPEN (75-25) 100 UNIT/ML Kwikpen Generic drug:  Insulin Lispro Prot & Lispro INJECT 30 UNITS UNDER THE SKIN EVERY MORNING AND 36 UNITS EVERY EVENING   Lactulose 20 GM/30ML Soln 30 ml every 4 hours until constipation is relieved   LEVEMIR FLEXTOUCH 100 UNIT/ML Pen Generic drug:  Insulin Detemir Inject 30 Units into the skin daily at 10 pm.   LEVEMIR FLEXTOUCH 100 UNIT/ML Pen Generic drug:  Insulin Detemir INJECT 40 UNITS UNDER THE SKIN EVERY DAY AT 10 PM   lisinopril-hydrochlorothiazide 20-25 MG tablet Commonly known as:  PRINZIDE,ZESTORETIC Take 1 tablet by mouth daily.   LORazepam 0.5 MG tablet Commonly known as:  ATIVAN TAKE 1 TABLET BY MOUTH AT BEDTIME, MAY REPEAT IN 1 HOUR IF NEEDED   mometasone 0.1 % lotion Commonly known as:  ELOCON   nystatin-triamcinolone ointment Commonly known as:  MYCOLOG Apply 1 application topically 2 (two) times daily. To left ear   traMADol 50 MG tablet Commonly known as:  ULTRAM Take 1 tablet (50 mg total) by mouth every 8 (eight) hours as needed.   TRUEPLUS LANCETS 28G Misc CHECK BLOOD SUGAR FOUR TIMES DAILY   Vitamin D3 2000 units Tabs Take 1 tablet by mouth daily.       Allergies: No Known Allergies  Family History: Family History  Problem Relation Age of Onset  . Cancer Mother 80    Lung Cancer  . Cancer Father 74    Lung Ca  . Diabetes Son   . Breast cancer Neg Hx     Social History:  reports that she quit smoking about 5 years ago. Her smoking use included Cigarettes. She has a 70.00 pack-year smoking history. She has never used smokeless tobacco. She reports that she does not  drink alcohol or use drugs.  Physical Exam: BP 123/73 (BP Location: Left Arm, Patient Position: Sitting, Cuff Size: Normal)   Pulse 69   Ht 5\' 7"  (1.702 m)   Wt 168 lb 9.6 oz (76.5 kg)   BMI 26.41 kg/m   Constitutional:  Alert and oriented, No acute distress. HEENT: [place] AT, moist mucus membranes.  Trachea midline, no masses. Cardiovascular: No clubbing, cyanosis, or edema. Respiratory: Normal respiratory effort, no increased work of breathing. GI: Abdomen  is soft, nontender, nondistended, no abdominal masses. GU: No CVA tenderness. Normal external genitalia.  Normal meatus.  Skin: No rashes, bruises or suspicious lesions. Neurologic: Grossly intact, no focal deficits, moving all 4 extremities. Psychiatric: Normal mood and affect.  Laboratory Data: Lab Results  Component Value Date   WBC 10.5 02/18/2016   HGB 12.8 02/18/2016   HCT 37.8 02/18/2016   MCV 87.0 02/18/2016   PLT 266.0 02/18/2016    Lab Results  Component Value Date   CREATININE 1.19 07/02/2016    Lab Results  Component Value Date   HGBA1C 6.8 (H) 05/21/2016    Cystoscopy Procedure Note  Patient identification was confirmed, informed consent was obtained, and patient was prepped using Betadine solution.  Lidocaine jelly was administered per urethral meatus.    Preoperative abx where received prior to procedure.    Procedure: - Flexible cystoscope introduced, without any difficulty.   - Thorough search of the bladder revealed:    normal urethral meatus    normal urothelium with small 0.2 mm lesion as below     no stones    no ulcers     no tumors    no urethral polyps    no trabeculation  - Right UO normal in position and appearance, Left UO not clearly visualized (surgically absent) but very small 0.2 cm papillary like lesion   Post-Procedure: - Patient tolerated the procedure well  Assessment & Plan:    1 - Left Upper Tract Transitional Cell Carcinoma -   S/p nephroureterectomy on  10/2014 Cystoscopy today with possible very small recurrence at the previous site of the left UO versus scar tissue Due for upper tract imaging  Given the above, I have recommended proceeding to the operating room for cystoscopy, bladder biopsy, intravesical, mitomycin, right RTG pyelogram.  Risk and benefits are reviewed in detail including risk of bleeding, infection, damage to any structures, need for further diagnostic imaging, bladder irritation amongst others. All questions are answered.  She will need cardiac clearance from Dr. Ubaldo Glassing.  Pending pathology results, we'll consider additional uppertract imaging.  2 - Solitary Kidney  - Excellent overall GFR by recent serum labs, observe.   Schedule surgery as above  Hollice Espy, MD  [facility] [address] [place], [place] [address] [phone]

## 2016-08-16 ENCOUNTER — Other Ambulatory Visit: Payer: Self-pay | Admitting: Internal Medicine

## 2016-08-18 ENCOUNTER — Telehealth: Payer: Self-pay | Admitting: Radiology

## 2016-08-18 NOTE — Telephone Encounter (Signed)
Notified pt of cardiac clearance appt with Dr Ubaldo Glassing on 08/25/16 @2 :00. Will contact pt to schedule surgery once clearance is obtained. Pt voices understanding.

## 2016-08-18 NOTE — Telephone Encounter (Signed)
LMOM. Need to discuss scheduling surgery.

## 2016-08-25 ENCOUNTER — Other Ambulatory Visit: Payer: Self-pay | Admitting: Radiology

## 2016-08-25 DIAGNOSIS — C642 Malignant neoplasm of left kidney, except renal pelvis: Secondary | ICD-10-CM

## 2016-08-25 DIAGNOSIS — I25810 Atherosclerosis of coronary artery bypass graft(s) without angina pectoris: Secondary | ICD-10-CM | POA: Diagnosis not present

## 2016-08-25 DIAGNOSIS — E782 Mixed hyperlipidemia: Secondary | ICD-10-CM | POA: Diagnosis not present

## 2016-08-25 DIAGNOSIS — Z01818 Encounter for other preprocedural examination: Secondary | ICD-10-CM | POA: Diagnosis not present

## 2016-08-25 DIAGNOSIS — I70219 Atherosclerosis of native arteries of extremities with intermittent claudication, unspecified extremity: Secondary | ICD-10-CM | POA: Diagnosis not present

## 2016-08-25 DIAGNOSIS — I739 Peripheral vascular disease, unspecified: Secondary | ICD-10-CM | POA: Diagnosis not present

## 2016-08-26 NOTE — Pre-Procedure Instructions (Signed)
CLEARED BY DR Ubaldo Glassing  08/25/16

## 2016-08-26 NOTE — Telephone Encounter (Signed)
Notified pt of surgery scheduled with Dr Erlene Quan on 09/01/16, pre-admit testing appt on 08/27/16 @1 :00 & to call Friday prior to surgery for arrival time to SDS. Advised pt to hold ASA 81mg  & Plavix beginning immediately per clearance obtained from Dr Ubaldo Glassing. Pt voices understanding.

## 2016-08-27 ENCOUNTER — Encounter: Payer: Self-pay | Admitting: *Deleted

## 2016-08-27 ENCOUNTER — Encounter
Admission: RE | Admit: 2016-08-27 | Discharge: 2016-08-27 | Disposition: A | Discharge status: Home / Self Care | Payer: Medicare Other | Source: Ambulatory Visit | Attending: Urology | Admitting: Urology

## 2016-08-27 DIAGNOSIS — Z905 Acquired absence of kidney: Secondary | ICD-10-CM | POA: Diagnosis not present

## 2016-08-27 DIAGNOSIS — I10 Essential (primary) hypertension: Secondary | ICD-10-CM | POA: Diagnosis not present

## 2016-08-27 DIAGNOSIS — E1151 Type 2 diabetes mellitus with diabetic peripheral angiopathy without gangrene: Secondary | ICD-10-CM | POA: Insufficient documentation

## 2016-08-27 DIAGNOSIS — I2510 Atherosclerotic heart disease of native coronary artery without angina pectoris: Secondary | ICD-10-CM | POA: Insufficient documentation

## 2016-08-27 DIAGNOSIS — E663 Overweight: Secondary | ICD-10-CM | POA: Insufficient documentation

## 2016-08-27 DIAGNOSIS — I739 Peripheral vascular disease, unspecified: Secondary | ICD-10-CM | POA: Diagnosis not present

## 2016-08-27 DIAGNOSIS — Z01812 Encounter for preprocedural laboratory examination: Secondary | ICD-10-CM | POA: Insufficient documentation

## 2016-08-27 DIAGNOSIS — E1121 Type 2 diabetes mellitus with diabetic nephropathy: Secondary | ICD-10-CM | POA: Insufficient documentation

## 2016-08-27 DIAGNOSIS — C649 Malignant neoplasm of unspecified kidney, except renal pelvis: Secondary | ICD-10-CM | POA: Diagnosis not present

## 2016-08-27 LAB — BASIC METABOLIC PANEL
ANION GAP: 9 (ref 5–15)
BUN: 39 mg/dL — ABNORMAL HIGH (ref 6–20)
CALCIUM: 9.1 mg/dL (ref 8.9–10.3)
CO2: 26 mmol/L (ref 22–32)
Chloride: 105 mmol/L (ref 101–111)
Creatinine, Ser: 1.33 mg/dL — ABNORMAL HIGH (ref 0.44–1.00)
GFR calc Af Amer: 50 mL/min — ABNORMAL LOW (ref >60)
GFR calc non Af Amer: 43 mL/min — ABNORMAL LOW (ref >60)
GLUCOSE: 173 mg/dL — AB (ref 65–99)
Potassium: 5.7 mmol/L — ABNORMAL HIGH (ref 3.5–5.1)
Sodium: 140 mmol/L (ref 135–145)

## 2016-08-27 LAB — URINALYSIS, ROUTINE W REFLEX MICROSCOPIC
BILIRUBIN URINE: NEGATIVE
Glucose, UA: NEGATIVE mg/dL
HGB URINE DIPSTICK: NEGATIVE
KETONES UR: NEGATIVE mg/dL
Leukocytes, UA: NEGATIVE
NITRITE: NEGATIVE
PROTEIN: NEGATIVE mg/dL
SPECIFIC GRAVITY, URINE: 1.008 (ref 1.005–1.030)
pH: 5 (ref 5.0–8.0)

## 2016-08-27 LAB — SURGICAL PCR SCREEN
MRSA, PCR: NEGATIVE
STAPHYLOCOCCUS AUREUS: NEGATIVE

## 2016-08-27 NOTE — Patient Instructions (Signed)
  Your procedure is scheduled on: September 01, 2016 (Monday) Report to Same Day Surgery 2nd floor medical mall [facility] Entrance-take elevator on left to 2nd floor.  Check in with surgery information desk.) To find out your arrival time please call [phone] between 1PM - 3PM on August 29, 2016 (Friday)  Remember: Instructions that are not followed completely may result in serious medical risk, up to and including death, or upon the discretion of your surgeon and anesthesiologist your surgery may need to be rescheduled.    _x___ 1. Do not eat food or drink liquids after midnight. No gum chewing or hard candies.     __x__ 2. No Alcohol for 24 hours before or after surgery.   __x__3. No Smoking for 24 prior to surgery.   ____  4. Bring all medications with you on the day of surgery if instructed.    __x__ 5. Notify your doctor if there is any change in your medical condition     (cold, fever, infections).     Do not wear jewelry, make-up, hairpins, clips or nail polish.  Do not wear lotions, powders, or perfumes. You may wear deodorant.  Do not shave 48 hours prior to surgery. Men may shave face and neck.  Do not bring valuables to the hospital.    [facility] is not responsible for any belongings or valuables.               Contacts, dentures or bridgework may not be worn into surgery.  Leave your suitcase in the car. After surgery it may be brought to your room.  For patients admitted to the hospital, discharge time is determined by your treatment team.   Patients discharged the day of surgery will not be allowed to drive home.  You will need someone to drive you home and stay with you the night of your procedure.    Please read over the following fact sheets that you were given:   [facility] Preparing for Surgery and or MRSA Information   _x___ Take these medicines the morning of surgery with A SIP OF WATER:    1. Gabapentin  2.  3.  4.  5.  6.  ____Fleets enema  or Magnesium Citrate as directed.   ___ Use CHG Soap or sage wipes as directed on instruction sheet   ____ Use inhalers on the day of surgery and bring to hospital day of surgery  ____ Stop metformin 2 days prior to surgery    __x__ Take 1/2 of usual insulin dose the night before surgery and none on the morning of  surgery      (Take one-half Levemir insulin on Sunday night and NO INSULIN THE MORNING OF SURGERY)     __x__ Stop Aspirin, Coumadin, Pllavix ,Eliquis, Effient, or Pradaxa (PATIENT STOPPED ASPIRIN AND PLAVIX ON FEBRUARY 20 )  x__ Stop Anti-inflammatories such as Advil, Aleve, Ibuprofen, Motrin, Naproxen,          Naprosyn, Goodies powders or aspirin products. Ok to take Tylenol.   ____ Stop supplements until after surgery.    ____ Bring C-Pap to the hospital.

## 2016-08-28 ENCOUNTER — Telehealth: Payer: Self-pay | Admitting: Internal Medicine

## 2016-08-28 DIAGNOSIS — E875 Hyperkalemia: Secondary | ICD-10-CM

## 2016-08-28 LAB — URINE CULTURE: CULTURE: NO GROWTH

## 2016-08-28 NOTE — Telephone Encounter (Signed)
I with spoke surgeons office and they are going to inform pt to have her come back to our lab for bmet redraw to recheck elevated kcl.

## 2016-08-28 NOTE — Pre-Procedure Instructions (Signed)
AS INSTRUCTED BY DR Talbert Forest 5.7 WAS FAXED TO DR Erlene Quan. HER NURSE AMY,IS CONTACTINF DR [name] OR INPUT. AMY WILL NOTIFY DR [name] IF MED CLEARANCE BY HER IS NEEDED. NOTIFIED JESSICA AT DR [name]'S TO HOLD ON CLEARANCE UNTIL FEEDBACK FROM DR [facility]. KT HAS TO BE 5.5 OR LESS

## 2016-08-28 NOTE — Telephone Encounter (Signed)
Patient t needs potassium level rechecked ASAP for preoperative clearance  Was elevated  yesterday

## 2016-08-29 ENCOUNTER — Other Ambulatory Visit (INDEPENDENT_AMBULATORY_CARE_PROVIDER_SITE_OTHER): Payer: Medicare Other

## 2016-08-29 DIAGNOSIS — E875 Hyperkalemia: Secondary | ICD-10-CM | POA: Diagnosis not present

## 2016-08-29 LAB — BASIC METABOLIC PANEL
BUN: 39 mg/dL — ABNORMAL HIGH (ref 6–23)
CO2: 27 mEq/L (ref 19–32)
CREATININE: 1.13 mg/dL (ref 0.40–1.20)
Calcium: 9.5 mg/dL (ref 8.4–10.5)
Chloride: 103 mEq/L (ref 96–112)
GFR: 52.57 mL/min — AB (ref >60.00)
GLUCOSE: 153 mg/dL — AB (ref 70–99)
Potassium: 5.1 mEq/L (ref 3.5–5.1)
Sodium: 136 mEq/L (ref 135–145)

## 2016-08-29 NOTE — Telephone Encounter (Signed)
Please advise on bmet resulted today. Surgical clearance form is in your red folder.

## 2016-08-29 NOTE — Telephone Encounter (Signed)
Repeat potassium is lower and normal at 5.1 surgical clearance form signed and in red folder

## 2016-08-29 NOTE — Pre-Procedure Instructions (Signed)
Called Laura Reed checking on clearance.

## 2016-08-30 ENCOUNTER — Other Ambulatory Visit: Payer: Self-pay | Admitting: Internal Medicine

## 2016-08-30 ENCOUNTER — Encounter: Payer: Self-pay | Admitting: Internal Medicine

## 2016-08-30 MED ORDER — AMLODIPINE BESYLATE 5 MG PO TABS: 5.0000 mg | ORAL_TABLET | Freq: Every day | ORAL | 3 refills | Status: DC

## 2016-08-31 MED ORDER — CEFAZOLIN IN D5W 1 GM/50ML IV SOLN
1.0000 g | INTRAVENOUS | Status: AC
  Administered 2016-09-01: 1 g via INTRAVENOUS

## 2016-09-01 ENCOUNTER — Encounter
Admission: RE | Disposition: A | Discharge status: Home / Self Care | Payer: Self-pay | Source: Ambulatory Visit | Attending: Urology

## 2016-09-01 ENCOUNTER — Encounter: Payer: Self-pay | Admitting: *Deleted

## 2016-09-01 ENCOUNTER — Ambulatory Visit: Payer: Medicare Other | Admitting: Anesthesiology

## 2016-09-01 ENCOUNTER — Ambulatory Visit
Admission: RE | Admit: 2016-09-01 | Discharge: 2016-09-01 | Disposition: A | Discharge status: Home / Self Care | Payer: Medicare Other | Source: Ambulatory Visit | Attending: Urology | Admitting: Urology

## 2016-09-01 DIAGNOSIS — C662 Malignant neoplasm of left ureter: Secondary | ICD-10-CM | POA: Diagnosis not present

## 2016-09-01 DIAGNOSIS — Z87891 Personal history of nicotine dependence: Secondary | ICD-10-CM | POA: Insufficient documentation

## 2016-09-01 DIAGNOSIS — I509 Heart failure, unspecified: Secondary | ICD-10-CM | POA: Insufficient documentation

## 2016-09-01 DIAGNOSIS — Z89429 Acquired absence of other toe(s), unspecified side: Secondary | ICD-10-CM | POA: Diagnosis not present

## 2016-09-01 DIAGNOSIS — C676 Malignant neoplasm of ureteric orifice: Secondary | ICD-10-CM | POA: Diagnosis not present

## 2016-09-01 DIAGNOSIS — N3289 Other specified disorders of bladder: Secondary | ICD-10-CM | POA: Diagnosis present

## 2016-09-01 DIAGNOSIS — E1151 Type 2 diabetes mellitus with diabetic peripheral angiopathy without gangrene: Secondary | ICD-10-CM | POA: Diagnosis not present

## 2016-09-01 DIAGNOSIS — Z951 Presence of aortocoronary bypass graft: Secondary | ICD-10-CM | POA: Insufficient documentation

## 2016-09-01 DIAGNOSIS — C675 Malignant neoplasm of bladder neck: Secondary | ICD-10-CM

## 2016-09-01 DIAGNOSIS — Z79899 Other long term (current) drug therapy: Secondary | ICD-10-CM | POA: Insufficient documentation

## 2016-09-01 DIAGNOSIS — C642 Malignant neoplasm of left kidney, except renal pelvis: Secondary | ICD-10-CM

## 2016-09-01 DIAGNOSIS — D494 Neoplasm of unspecified behavior of bladder: Secondary | ICD-10-CM | POA: Diagnosis not present

## 2016-09-01 DIAGNOSIS — Z905 Acquired absence of kidney: Secondary | ICD-10-CM | POA: Insufficient documentation

## 2016-09-01 DIAGNOSIS — I2510 Atherosclerotic heart disease of native coronary artery without angina pectoris: Secondary | ICD-10-CM | POA: Diagnosis not present

## 2016-09-01 DIAGNOSIS — I739 Peripheral vascular disease, unspecified: Secondary | ICD-10-CM | POA: Diagnosis not present

## 2016-09-01 DIAGNOSIS — Z794 Long term (current) use of insulin: Secondary | ICD-10-CM | POA: Diagnosis not present

## 2016-09-01 DIAGNOSIS — I110 Hypertensive heart disease with heart failure: Secondary | ICD-10-CM | POA: Insufficient documentation

## 2016-09-01 DIAGNOSIS — Z7982 Long term (current) use of aspirin: Secondary | ICD-10-CM | POA: Diagnosis not present

## 2016-09-01 DIAGNOSIS — C679 Malignant neoplasm of bladder, unspecified: Secondary | ICD-10-CM | POA: Diagnosis not present

## 2016-09-01 HISTORY — PX: TRANSURETHRAL RESECTION OF BLADDER TUMOR WITH MITOMYCIN-C: SHX6459

## 2016-09-01 HISTORY — PX: CYSTOSCOPY W/ RETROGRADES: SHX1426

## 2016-09-01 HISTORY — DX: Anxiety disorder, unspecified: F41.9

## 2016-09-01 HISTORY — DX: Polyneuropathy, unspecified: G62.9

## 2016-09-01 LAB — POCT I-STAT 4, (NA,K, GLUC, HGB,HCT)
GLUCOSE: 219 mg/dL — AB (ref 65–99)
HCT: 36 % (ref 36.0–46.0)
Hemoglobin: 12.2 g/dL (ref 12.0–15.0)
Potassium: 4.5 mmol/L (ref 3.5–5.1)
SODIUM: 138 mmol/L (ref 135–145)

## 2016-09-01 LAB — GLUCOSE, CAPILLARY
GLUCOSE-CAPILLARY: 205 mg/dL — AB (ref 65–99)
GLUCOSE-CAPILLARY: 225 mg/dL — AB (ref 65–99)

## 2016-09-01 SURGERY — TRANSURETHRAL RESECTION OF BLADDER TUMOR WITH MITOMYCIN-C
Anesthesia: General | Site: Ureter | Laterality: Right | Wound class: Clean Contaminated

## 2016-09-01 MED ORDER — LIDOCAINE HCL (CARDIAC) 20 MG/ML IV SOLN
INTRAVENOUS | Status: DC | PRN
  Administered 2016-09-01: 80 mg via INTRAVENOUS

## 2016-09-01 MED ORDER — OXYBUTYNIN CHLORIDE 5 MG PO TABS: 5.0000 mg | ORAL_TABLET | Freq: Three times a day (TID) | ORAL | 0 refills | Status: DC | PRN

## 2016-09-01 MED ORDER — CEFAZOLIN IN D5W 1 GM/50ML IV SOLN
INTRAVENOUS | Status: AC
  Filled 2016-09-01: qty 50

## 2016-09-01 MED ORDER — EPHEDRINE SULFATE 50 MG/ML IJ SOLN
INTRAMUSCULAR | Status: DC | PRN
  Administered 2016-09-01: 10 mg via INTRAVENOUS
  Administered 2016-09-01: 5 mg via INTRAVENOUS

## 2016-09-01 MED ORDER — PROPOFOL 10 MG/ML IV BOLUS
INTRAVENOUS | Status: DC | PRN
  Administered 2016-09-01: 150 mg via INTRAVENOUS

## 2016-09-01 MED ORDER — SUGAMMADEX SODIUM 200 MG/2ML IV SOLN
INTRAVENOUS | Status: DC | PRN
  Administered 2016-09-01: 160 mg via INTRAVENOUS

## 2016-09-01 MED ORDER — EPHEDRINE SULFATE 50 MG/ML IJ SOLN
INTRAMUSCULAR | Status: AC
  Administered 2016-09-01: 15 mg via INTRAVENOUS
  Filled 2016-09-01: qty 1

## 2016-09-01 MED ORDER — ONDANSETRON HCL 4 MG/2ML IJ SOLN: 4.0000 mg | Freq: Once | INTRAMUSCULAR | Status: DC | PRN

## 2016-09-01 MED ORDER — ROCURONIUM BROMIDE 100 MG/10ML IV SOLN
INTRAVENOUS | Status: DC | PRN
  Administered 2016-09-01: 10 mg via INTRAVENOUS

## 2016-09-01 MED ORDER — ONDANSETRON HCL 4 MG/2ML IJ SOLN
INTRAMUSCULAR | Status: DC | PRN
  Administered 2016-09-01: 4 mg via INTRAVENOUS

## 2016-09-01 MED ORDER — ONDANSETRON HCL 4 MG/2ML IJ SOLN
INTRAMUSCULAR | Status: AC
  Filled 2016-09-01: qty 2

## 2016-09-01 MED ORDER — EPHEDRINE SULFATE 50 MG/ML IJ SOLN
15.0000 mg | Freq: Once | INTRAMUSCULAR | Status: AC
  Administered 2016-09-01: 15 mg via INTRAVENOUS
  Filled 2016-09-01: qty 0.3

## 2016-09-01 MED ORDER — MITOMYCIN CHEMO FOR BLADDER INSTILLATION 40 MG
INTRAVENOUS | Status: DC | PRN
  Administered 2016-09-01: 40 mg via INTRAVESICAL

## 2016-09-01 MED ORDER — FAMOTIDINE 20 MG PO TABS
ORAL_TABLET | ORAL | Status: AC
  Filled 2016-09-01: qty 1

## 2016-09-01 MED ORDER — PHENYLEPHRINE HCL 10 MG/ML IJ SOLN
INTRAMUSCULAR | Status: AC
  Filled 2016-09-01: qty 1

## 2016-09-01 MED ORDER — SODIUM CHLORIDE 0.9 % IV SOLN
INTRAVENOUS | Status: DC
  Administered 2016-09-01: 07:00:00 via INTRAVENOUS

## 2016-09-01 MED ORDER — LIDOCAINE HCL (PF) 2 % IJ SOLN
INTRAMUSCULAR | Status: AC
  Filled 2016-09-01: qty 2

## 2016-09-01 MED ORDER — FAMOTIDINE 20 MG PO TABS
20.0000 mg | ORAL_TABLET | Freq: Once | ORAL | Status: AC
  Administered 2016-09-01: 20 mg via ORAL

## 2016-09-01 MED ORDER — MITOMYCIN CHEMO FOR BLADDER INSTILLATION 40 MG
40.0000 mg | Freq: Once | INTRAVENOUS | Status: DC
  Filled 2016-09-01: qty 40

## 2016-09-01 MED ORDER — PHENYLEPHRINE HCL 10 MG/ML IJ SOLN
INTRAMUSCULAR | Status: DC | PRN
  Administered 2016-09-01: 100 ug via INTRAVENOUS

## 2016-09-01 MED ORDER — IOTHALAMATE MEGLUMINE 43 % IV SOLN
INTRAVENOUS | Status: DC | PRN
  Administered 2016-09-01: 20 mL

## 2016-09-01 MED ORDER — SODIUM CHLORIDE 0.9 % IJ SOLN
INTRAMUSCULAR | Status: AC
  Filled 2016-09-01: qty 10

## 2016-09-01 MED ORDER — MIDAZOLAM HCL 2 MG/2ML IJ SOLN
INTRAMUSCULAR | Status: AC
  Filled 2016-09-01: qty 2

## 2016-09-01 MED ORDER — HYDROCODONE-ACETAMINOPHEN 5-325 MG PO TABS: 1.0000 | ORAL_TABLET | Freq: Four times a day (QID) | ORAL | 0 refills | Status: DC | PRN

## 2016-09-01 MED ORDER — EPHEDRINE SULFATE 50 MG/ML IJ SOLN
INTRAMUSCULAR | Status: AC
  Filled 2016-09-01: qty 1

## 2016-09-01 MED ORDER — ROCURONIUM BROMIDE 50 MG/5ML IV SOSY
PREFILLED_SYRINGE | INTRAVENOUS | Status: AC
  Filled 2016-09-01: qty 5

## 2016-09-01 MED ORDER — SUGAMMADEX SODIUM 200 MG/2ML IV SOLN
INTRAVENOUS | Status: AC
  Filled 2016-09-01: qty 2

## 2016-09-01 MED ORDER — FENTANYL CITRATE (PF) 100 MCG/2ML IJ SOLN
INTRAMUSCULAR | Status: DC | PRN
  Administered 2016-09-01 (×2): 50 ug via INTRAVENOUS

## 2016-09-01 MED ORDER — SUCCINYLCHOLINE CHLORIDE 20 MG/ML IJ SOLN
INTRAMUSCULAR | Status: DC | PRN
  Administered 2016-09-01: 100 mg via INTRAVENOUS

## 2016-09-01 MED ORDER — FENTANYL CITRATE (PF) 100 MCG/2ML IJ SOLN
INTRAMUSCULAR | Status: AC
  Filled 2016-09-01: qty 2

## 2016-09-01 MED ORDER — PROPOFOL 10 MG/ML IV BOLUS
INTRAVENOUS | Status: AC
  Filled 2016-09-01: qty 20

## 2016-09-01 MED ORDER — SUCCINYLCHOLINE CHLORIDE 20 MG/ML IJ SOLN
INTRAMUSCULAR | Status: AC
  Filled 2016-09-01: qty 1

## 2016-09-01 MED ORDER — MIDAZOLAM HCL 2 MG/2ML IJ SOLN
INTRAMUSCULAR | Status: DC | PRN
  Administered 2016-09-01: 2 mg via INTRAVENOUS

## 2016-09-01 MED ORDER — FENTANYL CITRATE (PF) 100 MCG/2ML IJ SOLN: 25.0000 ug | INTRAMUSCULAR | Status: DC | PRN

## 2016-09-01 SURGICAL SUPPLY — 32 items
BAG DRAIN CYSTO-URO LG1000N (MISCELLANEOUS) ×4 IMPLANT
BAG URO DRAIN 2000ML W/SPOUT (MISCELLANEOUS) ×4 IMPLANT
CATH FOLEY 2WAY  5CC 16FR (CATHETERS) ×2
CATH URETL 5X70 OPEN END (CATHETERS) ×4 IMPLANT
CATH URTH 16FR FL 2W BLN LF (CATHETERS) ×2 IMPLANT
CONRAY 43 FOR UROLOGY 50M (MISCELLANEOUS) ×4 IMPLANT
DRAPE UTILITY 15X26 TOWEL STRL (DRAPES) ×4 IMPLANT
DRESSING TELFA 4X3 1S ST N-ADH (GAUZE/BANDAGES/DRESSINGS) ×4 IMPLANT
ELECT LOOP 22F BIPOLAR SML (ELECTROSURGICAL) ×4
ELECT REM PT RETURN 9FT ADLT (ELECTROSURGICAL) ×4
ELECTRODE LOOP 22F BIPOLAR SML (ELECTROSURGICAL) ×2 IMPLANT
ELECTRODE REM PT RTRN 9FT ADLT (ELECTROSURGICAL) ×2 IMPLANT
GLOVE BIO SURGEON STRL SZ 6.5 (GLOVE) ×3 IMPLANT
GLOVE BIO SURGEONS STRL SZ 6.5 (GLOVE) ×1
GOWN STRL REUS W/ TWL LRG LVL3 (GOWN DISPOSABLE) ×4 IMPLANT
GOWN STRL REUS W/TWL LRG LVL3 (GOWN DISPOSABLE) ×4
KIT RM TURNOVER CYSTO AR (KITS) ×4 IMPLANT
LOOP CUT BIPOLAR 24F LRG (ELECTROSURGICAL) IMPLANT
NDL SAFETY ECLIPSE 18X1.5 (NEEDLE) ×2 IMPLANT
NEEDLE HYPO 18GX1.5 SHARP (NEEDLE) ×2
PACK CYSTO AR (MISCELLANEOUS) ×4 IMPLANT
SCRUB POVIDONE IODINE 4 OZ (MISCELLANEOUS) ×4 IMPLANT
SENSORWIRE 0.038 NOT ANGLED (WIRE) ×4
SET CYSTO W/LG BORE CLAMP LF (SET/KITS/TRAYS/PACK) IMPLANT
SET IRRIG Y TYPE TUR BLADDER L (SET/KITS/TRAYS/PACK) ×4 IMPLANT
SET IRRIGATING DISP (SET/KITS/TRAYS/PACK) ×4 IMPLANT
SOL .9 NS 3000ML IRR  AL (IV SOLUTION) ×2
SOL .9 NS 3000ML IRR UROMATIC (IV SOLUTION) ×2 IMPLANT
SURGILUBE 2OZ TUBE FLIPTOP (MISCELLANEOUS) ×4 IMPLANT
SYRINGE IRR TOOMEY STRL 70CC (SYRINGE) ×4 IMPLANT
WATER STERILE IRR 1000ML POUR (IV SOLUTION) ×4 IMPLANT
WIRE SENSOR 0.038 NOT ANGLED (WIRE) ×2 IMPLANT

## 2016-09-01 NOTE — Anesthesia Postprocedure Evaluation (Signed)
Anesthesia Post Note  Patient: Laura Reed  Procedure(s) Performed: Procedure(s) (LRB): TRANSURETHRAL RESECTION OF BLADDER TUMOR WITH MITOMYCIN-C (N/A) CYSTOSCOPY WITH RETROGRADE PYELOGRAM (Right)  Patient location during evaluation: PACU Anesthesia Type: General Level of consciousness: awake and alert and oriented Pain management: pain level controlled Vital Signs Assessment: post-procedure vital signs reviewed and stable Respiratory status: spontaneous breathing Cardiovascular status: blood pressure returned to baseline Anesthetic complications: no     Last Vitals:  Vitals:   09/01/16 0957 09/01/16 1006  BP: (!) 136/51 (!) 145/47  Pulse: 87 82  Resp: 16   Temp: 36.4 C     Last Pain:  Vitals:   09/01/16 0857  TempSrc:   PainSc: 0-No pain                 Jerric Oyen

## 2016-09-01 NOTE — H&P (View-Only) (Signed)
10:37 AM  08/15/16  Laura Reed Nov 28, 1960 [id number]  Referring provider: [name], MD [address] [place],  [address]  Chief Complaint  Patient presents with  . Cysto    HPI:  1 - Left Upper Tract Transitional Cell Carcinoma - s/p LEFT robotic nephroureterectomy on 10/31/2014 as well as combined ventral hernia repair with Dr. Bary Castilla. Post complicated by wound infection now well healed. Pathology  pT1N0 with negative margins.  Recent Surveillance: 02/2015 - cysto NED; 05/2015 cysto NED, 08/2015- cysto NED, 11/2015- NED, CT Urogram 11/12/15 negative, poor quality of delayed phase.; 5/2-17- cysto with mild erythema following recent UTI; 02/2016 NED   2 - Solitary Kidney  - s/p left nephrecotmy as per above. Most recent Cr 1.19 06/2016.   She does have an extensive smoking history, quit 5 years ago but smoked up to 2 packs a day for 35 years. She also has multiple medical comorbidities including history of diabetes, CAD status post CABG, carotid endarterectomy.    She presents today for routine cystoscopy.     PMH: Past Medical History:  Diagnosis Date  . Cancer ([place])   . CHF (congestive heart failure) ([place])   . Coronary artery disease   . Diabetes mellitus   . Heart murmur   . Hemorrhoid   . Hypertension   . PVD (peripheral vascular disease) ([place])   . Urothelial carcinoma of kidney ([place]) 10/31/2014   INVASIVE UROTHELIAL CARCINOMA, LOW GRADE. T1, Nx.    Surgical History: Past Surgical History:  Procedure Laterality Date  . AMPUTATION TOE    . ARTERIAL BYPASS SURGRY  2009, 2013 x 2   right leg , done in [place]  . CAROTID ENDARTERECTOMY  July 2015   Dr Delana Meyer  . CATARACT EXTRACTION W/PHACO Right 12/14/2014   Procedure: CATARACT EXTRACTION PHACO AND INTRAOCULAR LENS PLACEMENT (IOC);  Surgeon: Lyla Glassing, MD;  Location: [facility];  Service: Ophthalmology;  Laterality: Right;  [place]   00:38.6              AP        7.1                    CDE  2.76  . CESAREAN SECTION    . CHOLECYSTECTOMY  03-03-12   Porcelain gallbladder, gallstones,  [place]  . COLONOSCOPY W/ BIOPSIES  04/28/2012   Hyperplastic rectal polyps.  . CORONARY ARTERY BYPASS GRAFT  2009   3 vessel  . HERNIA REPAIR  10-31-14   ventral, retro-rectus atrium mesh  . NEPHRECTOMY Left 10-31-14  . PERIPHERAL VASCULAR CATHETERIZATION Left 05/01/2015   Procedure: Lower Extremity Angiography;  Surgeon: Katha Cabal, MD;  Location: [place] [facility];  Service: Cardiovascular;  Laterality: Left;  . PERIPHERAL VASCULAR CATHETERIZATION  05/01/2015   Procedure: Lower Extremity Intervention;  Surgeon: Katha Cabal, MD;  Location: [place] [facility];  Service: Cardiovascular;;  . PERIPHERAL VASCULAR CATHETERIZATION Left 02/20/2015   Procedure: Pelvic Angiography;  Surgeon: Katha Cabal, MD;  Location: [place] [facility];  Service: Cardiovascular;  Laterality: Left;    Home Medications:  Allergies as of 08/15/2016   No Known Allergies     Medication List       Accurate as of 08/15/16 10:37 AM. Always use your most recent med list.          ACCU-CHEK AVIVA PLUS test strip Generic drug:  glucose blood CHECK BLOOD SUGAR TWO TO THREE TIMES A  DAY   AMITIZA 24 MCG capsule Generic drug:  lubiprostone TAKE ONE CAPSULE BY MOUTH TWICE DAILY WITH A MEAL   aspirin 81 MG chewable tablet Chew 81 mg by mouth daily.   atorvastatin 20 MG tablet Commonly known as:  LIPITOR Take 20 mg by mouth daily.   B-D ULTRAFINE III SHORT PEN 31G X 8 MM Misc Generic drug:  Insulin Pen Needle TEST BLOOD SUGAR TWICE DAILY   B-D ULTRAFINE III SHORT PEN 31G X 8 MM Misc Generic drug:  Insulin Pen Needle TEST BLOOD SUGAR TWICE DAILY   benzonatate 100 MG capsule Commonly known as:  TESSALON Take 1 capsule (100 mg total) by mouth 2 (two) times daily as needed for cough.   clopidogrel 75 MG tablet Commonly known as:  PLAVIX Take 75 mg by mouth daily.   clopidogrel 75 MG  tablet Commonly known as:  PLAVIX TAKE 1 TABLET BY MOUTH EVERY DAY   DIGOX 0.125 MG tablet Generic drug:  digoxin TAKE 1 TABLET BY MOUTH EVERY DAY   FLUVIRIN Susp Generic drug:  Influenza Vac Typ A&B Surf Ant   gabapentin 600 MG tablet Commonly known as:  NEURONTIN Take 600 mg by mouth 3 (three) times daily.   HUMALOG MIX 75/25 KWIKPEN (75-25) 100 UNIT/ML Kwikpen Generic drug:  Insulin Lispro Prot & Lispro INJECT 30 UNITS UNDER THE SKIN EVERY MORNING AND 36 UNITS EVERY EVENING   Lactulose 20 GM/30ML Soln 30 ml every 4 hours until constipation is relieved   LEVEMIR FLEXTOUCH 100 UNIT/ML Pen Generic drug:  Insulin Detemir Inject 30 Units into the skin daily at 10 pm.   LEVEMIR FLEXTOUCH 100 UNIT/ML Pen Generic drug:  Insulin Detemir INJECT 40 UNITS UNDER THE SKIN EVERY DAY AT 10 PM   lisinopril-hydrochlorothiazide 20-25 MG tablet Commonly known as:  PRINZIDE,ZESTORETIC Take 1 tablet by mouth daily.   LORazepam 0.5 MG tablet Commonly known as:  ATIVAN TAKE 1 TABLET BY MOUTH AT BEDTIME, MAY REPEAT IN 1 HOUR IF NEEDED   mometasone 0.1 % lotion Commonly known as:  ELOCON   nystatin-triamcinolone ointment Commonly known as:  MYCOLOG Apply 1 application topically 2 (two) times daily. To left ear   traMADol 50 MG tablet Commonly known as:  ULTRAM Take 1 tablet (50 mg total) by mouth every 8 (eight) hours as needed.   TRUEPLUS LANCETS 28G Misc CHECK BLOOD SUGAR FOUR TIMES DAILY   Vitamin D3 2000 units Tabs Take 1 tablet by mouth daily.       Allergies: No Known Allergies  Family History: Family History  Problem Relation Age of Onset  . Cancer Mother 80    Lung Cancer  . Cancer Father 54    Lung Ca  . Diabetes Son   . Breast cancer Neg Hx     Social History:  reports that she quit smoking about 5 years ago. Her smoking use included Cigarettes. She has a 70.00 pack-year smoking history. She has never used smokeless tobacco. She reports that she does not  drink alcohol or use drugs.  Physical Exam: BP 123/73 (BP Location: Left Arm, Patient Position: Sitting, Cuff Size: Normal)   Pulse 69   Ht 5\' 7"  (1.702 m)   Wt 168 lb 9.6 oz (76.5 kg)   BMI 26.41 kg/m   Constitutional:  Alert and oriented, No acute distress. HEENT: [place] AT, moist mucus membranes.  Trachea midline, no masses. Cardiovascular: No clubbing, cyanosis, or edema. Respiratory: Normal respiratory effort, no increased work of breathing. GI: Abdomen  is soft, nontender, nondistended, no abdominal masses. GU: No CVA tenderness. Normal external genitalia.  Normal meatus.  Skin: No rashes, bruises or suspicious lesions. Neurologic: Grossly intact, no focal deficits, moving all 4 extremities. Psychiatric: Normal mood and affect.  Laboratory Data: Lab Results  Component Value Date   WBC 10.5 02/18/2016   HGB 12.8 02/18/2016   HCT 37.8 02/18/2016   MCV 87.0 02/18/2016   PLT 266.0 02/18/2016    Lab Results  Component Value Date   CREATININE 1.19 07/02/2016    Lab Results  Component Value Date   HGBA1C 6.8 (H) 05/21/2016    Cystoscopy Procedure Note  Patient identification was confirmed, informed consent was obtained, and patient was prepped using Betadine solution.  Lidocaine jelly was administered per urethral meatus.    Preoperative abx where received prior to procedure.    Procedure: - Flexible cystoscope introduced, without any difficulty.   - Thorough search of the bladder revealed:    normal urethral meatus    normal urothelium with small 0.2 mm lesion as below     no stones    no ulcers     no tumors    no urethral polyps    no trabeculation  - Right UO normal in position and appearance, Left UO not clearly visualized (surgically absent) but very small 0.2 cm papillary like lesion   Post-Procedure: - Patient tolerated the procedure well  Assessment & Plan:    1 - Left Upper Tract Transitional Cell Carcinoma -   S/p nephroureterectomy on  10/2014 Cystoscopy today with possible very small recurrence at the previous site of the left UO versus scar tissue Due for upper tract imaging  Given the above, I have recommended proceeding to the operating room for cystoscopy, bladder biopsy, intravesical, mitomycin, right RTG pyelogram.  Risk and benefits are reviewed in detail including risk of bleeding, infection, damage to any structures, need for further diagnostic imaging, bladder irritation amongst others. All questions are answered.  She will need cardiac clearance from Dr. Ubaldo Glassing.  Pending pathology results, we'll consider additional uppertract imaging.  2 - Solitary Kidney  - Excellent overall GFR by recent serum labs, observe.   Schedule surgery as above  Hollice Espy, MD  [facility] [address], [place], [place] [address] [phone]

## 2016-09-01 NOTE — Discharge Instructions (Signed)
Transurethral Resection of Bladder Tumor (TURBT) or Bladder Biopsy ° ° °Definition: ° Transurethral Resection of the Bladder Tumor is a surgical procedure used to diagnose and remove tumors within the bladder. TURBT is the most common treatment for early stage bladder cancer. ° °General instructions: °   ° Your recent bladder surgery requires very little post hospital care but some definite precautions. ° °Despite the fact that no skin incisions were used, the area around the bladder incisions are raw and covered with scabs to promote healing and prevent bleeding. Certain precautions are needed to insure that the scabs are not disturbed over the next 2-4 weeks while the healing proceeds. ° °Because the raw surface inside your bladder and the irritating effects of urine you may expect frequency of urination and/or urgency (a stronger desire to urinate) and perhaps even getting up at night more often. This will usually resolve or improve slowly over the healing period. You may see some blood in your urine over the first 6 weeks. Do not be alarmed, even if the urine was clear for a while. Get off your feet and drink lots of fluids until clearing occurs. If you start to pass clots or don't improve call us. ° °Diet: ° °You may return to your normal diet immediately. Because of the raw surface of your bladder, alcohol, spicy foods, foods high in acid and drinks with caffeine may cause irritation or frequency and should be used in moderation. To keep your urine flowing freely and avoid constipation, drink plenty of fluids during the day (8-10 glasses). Tip: Avoid cranberry juice because it is very acidic. ° °Activity: ° °Your physical activity doesn't need to be restricted. However, if you are very active, you may see some blood in the urine. We suggest that you reduce your activity under the circumstances until the bleeding has stopped. ° °Bowels: ° °It is important to keep your bowels regular during the postoperative  period. Straining with bowel movements can cause bleeding. A bowel movement every other day is reasonable. Use a mild laxative if needed, such as milk of magnesia 2-3 tablespoons, or 2 Dulcolax tablets. Call if you continue to have problems. If you had been taking narcotics for pain, before, during or after your surgery, you may be constipated. Take a laxative if necessary. ° ° ° °Medication: ° °You should resume your pre-surgery medications unless told not to. In addition you may be given an antibiotic to prevent or treat infection. Antibiotics are not always necessary. All medication should be taken as prescribed until the bottles are finished unless you are having an unusual reaction to one of the drugs. ° ° °[facility] °[address], [address] °[place], [place] [address] °[phone] ° ° °AMBULATORY SURGERY  °DISCHARGE INSTRUCTIONS ° ° °1) The drugs that you were given will stay in your system until tomorrow so for the next 24 hours you should not: ° °A) Drive an automobile °B) Make any legal decisions °C) Drink any alcoholic beverage ° ° °2) You may resume regular meals tomorrow.  Today it is better to start with liquids and gradually work up to solid foods. ° °You may eat anything you prefer, but it is better to start with liquids, then soup and crackers, and gradually work up to solid foods. ° ° °3) Please notify your doctor immediately if you have any unusual bleeding, trouble breathing, redness and pain at the surgery site, drainage, fever, or pain not relieved by medication. ° ° ° °4)   Additional Instructions: ° ° ° ° ° ° ° °Please contact your physician with any problems or Same Day Surgery at [phone], Monday through Friday 6 am to 4 pm, or [place] [facility] number at [phone]. ° ° ° ° °

## 2016-09-01 NOTE — Anesthesia Procedure Notes (Signed)
Procedure Name: Intubation Date/Time: 09/01/2016 7:59 AM Performed by: Aline Brochure Pre-anesthesia Checklist: Patient identified, Emergency Drugs available, Suction available and Patient being monitored Patient Re-evaluated:Patient Re-evaluated prior to inductionOxygen Delivery Method: Circle system utilized Preoxygenation: Pre-oxygenation with 100% oxygen Intubation Type: IV induction Ventilation: Mask ventilation without difficulty Laryngoscope Size: Mac and 3 Grade View: Grade I Tube type: Oral Tube size: 7.0 mm Number of attempts: 1 Airway Equipment and Method: Stylet Placement Confirmation: ETT inserted through vocal cords under direct vision,  positive ETCO2 and breath sounds checked- equal and bilateral Secured at: 21 cm Tube secured with: Tape Dental Injury: Teeth and Oropharynx as per pre-operative assessment

## 2016-09-01 NOTE — Anesthesia Post-op Follow-up Note (Cosign Needed)
Anesthesia QCDR form completed.        

## 2016-09-01 NOTE — Interval H&P Note (Signed)
History and Physical Interval Note:  09/01/2016 7:22 AM  Laura Reed  has presented today for surgery, with the diagnosis of left upper tract transitional cell carcinoma  The various methods of treatment have been discussed with the patient and family. After consideration of risks, benefits and other options for treatment, the patient has consented to  Procedure(s): TRANSURETHRAL RESECTION OF BLADDER TUMOR WITH MITOMYCIN-C (N/A) CYSTOSCOPY WITH RETROGRADE PYELOGRAM (Right) as a surgical intervention .  The patient's history has been reviewed, patient examined, no change in status, stable for surgery.  I have reviewed the patient's chart and labs.  Questions were answered to the patient's satisfaction.    RRR CTAB  Hollice Espy

## 2016-09-01 NOTE — Transfer of Care (Signed)
Immediate Anesthesia Transfer of Care Note  Patient: Laura Reed  Procedure(s) Performed: Procedure(s): TRANSURETHRAL RESECTION OF BLADDER TUMOR WITH MITOMYCIN-C (N/A) CYSTOSCOPY WITH RETROGRADE PYELOGRAM (Right)  Patient Location: PACU  Anesthesia Type:General  Level of Consciousness: awake, alert  and oriented  Airway & Oxygen Therapy: Patient connected to face mask oxygen  Post-op Assessment: Post -op Vital signs reviewed and stable  Post vital signs: stable  Last Vitals:  Vitals:   09/01/16 0610 09/01/16 0842  BP: (!) 109/53 128/68  Pulse: 72 87  Resp: 16 10  Temp: 36.4 C 36.6 C    Last Pain:  Vitals:   09/01/16 0842  TempSrc: Temporal         Complications: No apparent anesthesia complications

## 2016-09-01 NOTE — Op Note (Signed)
Date of procedure: 09/01/16  Preoperative diagnosis:  1. History of left upper tract urothelial carcinoma 2. Bladder tumor  Postoperative diagnosis:  1. same   Procedure: 1. Right retrograde pyelogram 2. TURBT, 1 cm tumor 3. Random bladder biopsies 4. Instillation of intravesical mitomycin  Surgeon: Hollice Espy, MD  Anesthesia: General  Complications: None  Intraoperative findings: Papillary tumor at the left bladder neck proximally 1 cm (5:00 position) and some inflammatory appearing changes overlying the previous left UO.  Few subtle erythematous changes throughout the bladder which were biopsied. Unremarkable right retrograde pyelogram.  EBL: Minimal  Specimens: Left bladder neck tumor, biopsy of previous left UO, random bladder biopsies  Drains: 16 French Foley catheter  Indication: Laura Reed is a 56 y.o. patient with history of left upper tract urothelial cancer status post left nephro ureterectomy.  After reviewing the management options for treatment, he elected to proceed with the above surgical procedure(s). We have discussed the potential benefits and risks of the procedure, side effects of the proposed treatment, the likelihood of the patient achieving the goals of the procedure, and any potential problems that might occur during the procedure or recuperation. Informed consent has been obtained.  Description of procedure:  The patient was taken to the operating room and general anesthesia was induced.  The patient was placed in the dorsal lithotomy position, prepped and draped in the usual sterile fashion, and preoperative antibiotics were administered. A preoperative time-out was performed.   A 21 French cystoscope was advanced per urethra into the bladder. The bladder was carefully inspected at which time an approximately 1 cm papillary tumor was identified at the bladder neck on a fairly narrow stalk around the 5:00 position of the bladder neck. The trigone  appeared fairly unremarkable with an orthotopic right ureteral orifice. The left UO was surgically absent and there was some fine with appeared to be inflammatory changes overlying this. Diffusely throughout the bladder, there is a few small areas of patchy erythema in particular at the dome where a small stellate scar was also appreciated at this location.  Attention was then turned to the right ureteral orifice which was cannulated using a 5 [place] open-ended ureteral catheter. A gentle retrograde pyelogram was performed on this side which revealed no filling defects with a decompressed ureter and no hydroureteronephrosis. The right kidney drained briskly.  Next, cold cup biopsy forceps were used to piecewise resect the tumor at the bladder neck.  A deeper bite was taken at the bladder neck to ensure that muscular) of the specimen. This was passed off the field. Additional biopsies were then taken overlying the previous left UO. Additional random bladder biopsies were taken and a targeted fashion and any suspicious erythematous area including from the right and left lateral walls, posterior, and dome of the bladder. Finally, hemostasis was achieved carefully using Bugbee electrocautery. Once hemostasis was adequate, 16 [place] Foley catheter was placed. She was then repositioned supine position, reversed anesthesia, taken the PACU in stable condition.  40 mg of intravesical mitomycin was instilled and allowed to dwell the PACU for 1 hour. She tolerated this well. The Foley was removed prior to discharge.  Plan: Patient will follow-up in 2 weeks to review pathology. Given her history of upper tract urothelial carcinoma, we'll highly recommended BCG if she is a candidate.  Hollice Espy, M.D.

## 2016-09-01 NOTE — Telephone Encounter (Signed)
Signed form faxed to number on form.

## 2016-09-01 NOTE — Anesthesia Preprocedure Evaluation (Deleted)
Anesthesia Evaluation  Patient identified by MRN, date of birth, ID band Patient awake    Reviewed: Allergy & Precautions, NPO status , Patient's Chart, lab work & pertinent test results  History of Anesthesia Complications Negative for: history of anesthetic complications  Airway Mallampati: III  TM Distance: <3 FB Neck ROM: Full    Dental   Pulmonary Recent URI , former smoker,    Pulmonary exam normal        Cardiovascular hypertension, Pt. on medications + CAD, + Peripheral Vascular Disease and +CHF  Normal cardiovascular exam+ dysrhythmias + Valvular Problems/Murmurs (pt c/o murmur)      Neuro/Psych Anxiety  Neuromuscular disease    GI/Hepatic Neg liver ROS, hemorrhoids   Endo/Other  diabetes, Type 2, Insulin Dependent  Renal/GU Renal disease (l nephrectomy ) Bladder dysfunction      Musculoskeletal negative musculoskeletal ROS (+)   Abdominal Normal abdominal exam  (+)   Peds negative pediatric ROS (+)  Hematology negative hematology ROS (+)   Anesthesia Other Findings   Reproductive/Obstetrics                            Anesthesia Physical  Anesthesia Plan  ASA: III  Anesthesia Plan: General   Post-op Pain Management:    Induction: Intravenous  Airway Management Planned: Oral ETT  Additional Equipment:   Intra-op Plan:   Post-operative Plan:   Informed Consent: I have reviewed the patients History and Physical, chart, labs and discussed the procedure including the risks, benefits and alternatives for the proposed anesthesia with the patient or authorized representative who has indicated his/her understanding and acceptance.     Plan Discussed with:   Anesthesia Plan Comments:         Anesthesia Quick Evaluation                                   Anesthesia Evaluation  Patient identified by MRN, date of birth, ID band Patient  awake    Reviewed: Allergy & Precautions, NPO status , Patient's Chart, lab work & pertinent test results  History of Anesthesia Complications Negative for: history of anesthetic complications  Airway Mallampati: II  TM Distance: >3 FB Neck ROM: Full    Dental  (+) Upper Dentures, Lower Dentures   Pulmonary former smoker,           Cardiovascular hypertension, Pt. on medications + CAD, + Peripheral Vascular Disease and +CHF  + dysrhythmias + Valvular Problems/Murmurs (pt c/o murmur)      Neuro/Psych  Neuromuscular disease    GI/Hepatic   Endo/Other  diabetes, Type 2, Insulin Dependent  Renal/GU Renal disease (l nephrectomy )     Musculoskeletal   Abdominal   Peds  Hematology   Anesthesia Other Findings   Reproductive/Obstetrics                             Anesthesia Physical  Anesthesia Plan  ASA: III  Anesthesia Plan: General   Post-op Pain Management:    Induction: Intravenous  Airway Management Planned:   Additional Equipment:   Intra-op Plan:   Post-operative Plan:   Informed Consent: I have reviewed the patients History and Physical, chart, labs and discussed the procedure including the risks, benefits and alternatives for the proposed anesthesia with the patient or authorized representative who has indicated his/her  understanding and acceptance.     Plan Discussed with:   Anesthesia Plan Comments:         Anesthesia Quick Evaluation                                   Anesthesia Evaluation  Patient identified by MRN, date of birth, ID band Patient awake    Reviewed: Allergy & Precautions, NPO status , Patient's Chart, lab work & pertinent test results  History of Anesthesia Complications Negative for: history of anesthetic complications  Airway Mallampati: II  TM Distance: >3 FB Neck ROM: Full    Dental  (+) Upper Dentures, Lower Dentures   Pulmonary former smoker (quit x 4  yrs),          Cardiovascular hypertension, Pt. on medications + CAD and + Peripheral Vascular Disease + Valvular Problems/Murmurs (pt c/o murmur)     Neuro/Psych    GI/Hepatic   Endo/Other  diabetes, Type 2, Insulin Dependent  Renal/GU Renal disease (l nephrectomy )     Musculoskeletal   Abdominal   Peds  Hematology   Anesthesia Other Findings   Reproductive/Obstetrics                             Anesthesia Physical Anesthesia Plan  ASA: III  Anesthesia Plan: General   Post-op Pain Management:    Induction: Intravenous  Airway Management Planned:   Additional Equipment:   Intra-op Plan:   Post-operative Plan:   Informed Consent: I have reviewed the patients History and Physical, chart, labs and discussed the procedure including the risks, benefits and alternatives for the proposed anesthesia with the patient or authorized representative who has indicated his/her understanding and acceptance.     Plan Discussed with:   Anesthesia Plan Comments:         Anesthesia Quick Evaluation

## 2016-09-01 NOTE — Anesthesia Preprocedure Evaluation (Signed)
Anesthesia Evaluation  Patient identified by MRN, date of birth, ID band Patient awake    Reviewed: Allergy & Precautions, NPO status , Patient's Chart, lab work & pertinent test results  History of Anesthesia Complications Negative for: history of anesthetic complications  Airway Mallampati: II  TM Distance: >3 FB Neck ROM: Full    Dental  (+) Upper Dentures, Lower Dentures   Pulmonary former smoker,           Cardiovascular hypertension, Pt. on medications + CAD, + Peripheral Vascular Disease and +CHF  + dysrhythmias + Valvular Problems/Murmurs (pt c/o murmur)      Neuro/Psych  Neuromuscular disease    GI/Hepatic   Endo/Other  diabetes, Type 2, Insulin Dependent  Renal/GU Renal disease (l nephrectomy )     Musculoskeletal   Abdominal   Peds  Hematology   Anesthesia Other Findings   Reproductive/Obstetrics                             Anesthesia Physical  Anesthesia Plan  ASA: III  Anesthesia Plan: General   Post-op Pain Management:    Induction: Intravenous  Airway Management Planned: Oral ETT  Additional Equipment:   Intra-op Plan:   Post-operative Plan: Extubation in OR  Informed Consent: I have reviewed the patients History and Physical, chart, labs and discussed the procedure including the risks, benefits and alternatives for the proposed anesthesia with the patient or authorized representative who has indicated his/her understanding and acceptance.     Plan Discussed with: CRNA and Surgeon  Anesthesia Plan Comments:         Anesthesia Quick Evaluation

## 2016-09-02 LAB — SURGICAL PATHOLOGY

## 2016-09-03 ENCOUNTER — Ambulatory Visit: Payer: Medicare Other | Admitting: Internal Medicine

## 2016-09-09 ENCOUNTER — Other Ambulatory Visit: Payer: Self-pay | Admitting: Internal Medicine

## 2016-09-09 ENCOUNTER — Ambulatory Visit
Admission: RE | Admit: 2016-09-09 | Discharge: 2016-09-09 | Disposition: A | Discharge status: Home / Self Care | Payer: Medicare Other | Source: Ambulatory Visit | Attending: Internal Medicine | Admitting: Internal Medicine

## 2016-09-09 DIAGNOSIS — Z1231 Encounter for screening mammogram for malignant neoplasm of breast: Secondary | ICD-10-CM

## 2016-09-12 DIAGNOSIS — L851 Acquired keratosis [keratoderma] palmaris et plantaris: Secondary | ICD-10-CM | POA: Diagnosis not present

## 2016-09-12 DIAGNOSIS — Z794 Long term (current) use of insulin: Secondary | ICD-10-CM | POA: Diagnosis not present

## 2016-09-12 DIAGNOSIS — B351 Tinea unguium: Secondary | ICD-10-CM | POA: Diagnosis not present

## 2016-09-12 DIAGNOSIS — E1140 Type 2 diabetes mellitus with diabetic neuropathy, unspecified: Secondary | ICD-10-CM | POA: Diagnosis not present

## 2016-09-15 ENCOUNTER — Ambulatory Visit: Payer: Medicare Other

## 2016-09-18 ENCOUNTER — Ambulatory Visit (INDEPENDENT_AMBULATORY_CARE_PROVIDER_SITE_OTHER): Payer: Medicare Other | Admitting: Urology

## 2016-09-18 ENCOUNTER — Encounter: Payer: Self-pay | Admitting: Urology

## 2016-09-18 VITALS — BP 146/64 | HR 80 | Ht 67.0 in | Wt 167.0 lb

## 2016-09-18 DIAGNOSIS — Q600 Renal agenesis, unilateral: Secondary | ICD-10-CM

## 2016-09-18 DIAGNOSIS — C675 Malignant neoplasm of bladder neck: Secondary | ICD-10-CM

## 2016-09-18 DIAGNOSIS — C642 Malignant neoplasm of left kidney, except renal pelvis: Secondary | ICD-10-CM

## 2016-09-18 DIAGNOSIS — IMO0002 Reserved for concepts with insufficient information to code with codable children: Secondary | ICD-10-CM

## 2016-09-18 NOTE — Patient Instructions (Signed)
Bacillus Calmette-Guerin Live, BCG intravesical solution What is this medicine? BACILLUS CALMETTE-GUERIN LIVE, BCG (ba SIL us KAL met gay RAYN) is a bacteria solution. This medicine stimulates the immune system to ward off cancer cells. It is used to treat bladder cancer. This medicine may be used for other purposes; ask your health care provider or pharmacist if you have questions. COMMON BRAND NAME(S): Theracys, TICE BCG What should I tell my health care provider before I take this medicine? They need to know if you have any of these conditions: -aneurysm -blood in the urine -bladder biopsy within 2 weeks -fever or infection -immune system problems -leukemia -lymphoma -myasthenia gravis -need organ transplant -prosthetic device like arterial graft, artificial joint, prosthetic heart valve -recent or ongoing radiation therapy -tuberculosis -an unusual or allergic reaction to Bacillus Calmette-Guerin Live, BCG, latex, other medicines, foods, dyes, or preservatives -pregnant or trying to get pregnant -breast-feeding How should I use this medicine? This drug is given as a catheter infusion into the bladder. It is administered in a hospital or clinic by a specially trained health care professional. You will be given directions to follow before the treatment. Follow your doctor's directions carefully. Try to hold this medicine in your bladder for 2 hours after treatment. Talk to your pediatrician regarding the use of this medicine in children. Special care may be needed. Overdosage: If you think you have taken too much of this medicine contact a poison control center or emergency room at once. NOTE: This medicine is only for you. Do not share this medicine with others. What if I miss a dose? It is important not to miss your dose. Call your doctor or health care professional if you are unable to keep an appointment. What may interact with this medicine? -antibiotics -medicines to suppress  your immune system like chemotherapy agents or corticosteroids -medicine to treat tuberculosis This list may not describe all possible interactions. Give your health care provider a list of all the medicines, herbs, non-prescription drugs, or dietary supplements you use. Also tell them if you smoke, drink alcohol, or use illegal drugs. Some items may interact with your medicine. What should I watch for while using this medicine? Visit your doctor for checks on your progress. This drug may make you feel generally unwell. Contact your doctor if your symptoms last more than 2 days or if they get worse. Call your doctor right away if you have a severe or unusual symptom. Infection can be spread to others through contact with this medicine. To prevent the spread of infection follow your doctor's directions carefully after treatment. For the first 6 hours after each treatment, sit down on the toilet to urinate. After urinating, add 2 cups of bleach to the toilet bowl and let set for 15 minutes before flushing. Wash your hands before and after using the restroom. Drink water or other fluids as directed after treatment with this medicine. Do not become pregnant while taking this medicine. Women should inform their doctor if they wish to become pregnant or think they might be pregnant. There is a potential for serious side effects to an unborn child. Talk to your health care professional or pharmacist for more information. Do not breast-feed an infant while taking this medicine. What side effects may I notice from receiving this medicine? Side effects that you should report to your doctor or health care professional as soon as possible: -allergic reactions like skin rash, itching or hives, swelling of the face, lips, or tongue -signs of   notice from receiving this medicine?  Side effects that you should report to your doctor or health care professional as soon as possible:  -allergic reactions like skin rash, itching or hives, swelling of the face, lips, or tongue  -signs of infection - fever or chills, cough, sore throat, pain or difficulty passing urine  -signs of decreased red blood cells - unusually weak or tired, fainting spells,  lightheadedness  -blood in urine  -breathing problems  -cough  -eye pain, redness  -flu-like symptoms  -joint pain  -bladder-area pain for more than 2 days after treatment  -trouble passing urine or change in the amount of urine  -vomiting  -yellowing of the eyes or skin  Side effects that usually do not require medical attention (report to your doctor or health care professional if they continue or are bothersome):  -bladder spasm  -burning when passing urine within 2 days of treatment  -feel need to pass urine often or wake up at night to pass urine  -loss of appetite  This list may not describe all possible side effects. Call your doctor for medical advice about side effects. You may report side effects to FDA at 1-800-FDA-1088.  Where should I keep my medicine?  This drug is given in a hospital or clinic and will not be stored at home.  NOTE: This sheet is a summary. It may not cover all possible information. If you have questions about this medicine, talk to your doctor, pharmacist, or health care provider.   2018 [employer]vier/Gold Standard (2015-07-26 10:33:35)

## 2016-09-18 NOTE — Progress Notes (Signed)
09/18/2016 1:28 PM   Laura Reed Nov 24, 1960 [id number]  Referring provider: [name], MD [address], [place] [address]  CC: pathology review  HPI:  56 yo F with with history of pT1N0 left upper tract urothelial carcinoma s/p nephroU 4/016 with low grade Ta bladder recurrence s/p TURBT, mitomycin, right retrograde pyelogram.  She returns today to discuss pathology and treatment options.  1 - Left Upper Tract Transitional Cell Carcinoma - s/p LEFT robotic nephroureterectomy on 10/31/2014 as well as combined ventral hernia repair with Dr. Bary Castilla. Post complicated by wound infection now well healed. Pathology  pT1N0 with negative margins.  Recent Surveillance: 02/2015 - cysto NED; 05/2015 cysto NED, 08/2015- cysto NED, 11/2015- NED, CT Urogram 11/12/15 negative, poor quality of delayed phase.; 5/2-17- cysto with mild erythema following recent UTI; 02/2016 NED; 08/2016 Lg Ta TCC recurrence,  1 cm, multifocal.  RTG neg.    2 - Solitary Kidney  - s/p left nephrecotmy as per above. Most recent Cr 1.19 06/2016.   She does have an extensive smoking history, quit 5 years ago but smoked up to 2 packs a day for 35 years. She also has multiple medical comorbidities including history of diabetes, CAD status post CABG, carotid endarterectomy.      PMH: Past Medical History:  Diagnosis Date  . Anxiety   . Cancer ([facility])   . CHF (congestive heart failure) ([facility])   . Coronary artery disease   . Diabetes mellitus   . Heart murmur   . Hemorrhoid   . Hypertension   . Neuropathy ([place])   . PVD (peripheral vascular disease) ([place])   . Urothelial carcinoma of kidney ([facility]) 10/31/2014   INVASIVE UROTHELIAL CARCINOMA, LOW GRADE. T1, Nx.    Surgical History: Past Surgical History:  Procedure Laterality Date  . AMPUTATION TOE    . ARTERIAL BYPASS SURGRY  2009, 2013 x 2   right leg , done in [place]  . CARDIAC CATHETERIZATION    . CAROTID ENDARTERECTOMY Right 01/2014   Dr Delana Meyer  . CATARACT EXTRACTION W/PHACO Right 12/14/2014   Procedure: CATARACT EXTRACTION PHACO AND INTRAOCULAR LENS PLACEMENT (IOC);  Surgeon: Lyla Glassing, MD;  Location: [facility];  Service: Ophthalmology;  Laterality: Right;  [place]   00:38.6              AP        7.1                   CDE  2.76  . CESAREAN SECTION    . CHOLECYSTECTOMY  03-03-12   Porcelain gallbladder, gallstones,  [facility]  . COLONOSCOPY W/ BIOPSIES  04/28/2012   Hyperplastic rectal polyps.  . CORONARY ARTERY BYPASS GRAFT  2009   3 vessel  . CYSTOSCOPY W/ RETROGRADES Right 09/01/2016   Procedure: CYSTOSCOPY WITH RETROGRADE PYELOGRAM;  Surgeon: Hollice Espy, MD;  Location: [facility];  Service: Urology;  Laterality: Right;  . HERNIA REPAIR  10-31-14   ventral, retro-rectus atrium mesh  . NEPHRECTOMY Left 10-31-14  . PERIPHERAL VASCULAR CATHETERIZATION Left 05/01/2015   Procedure: Lower Extremity Angiography;  Surgeon: Katha Cabal, MD;  Location: [facility];  Service: Cardiovascular;  Laterality: Left;  . PERIPHERAL VASCULAR CATHETERIZATION  05/01/2015   Procedure: Lower Extremity Intervention;  Surgeon: Katha Cabal, MD;  Location: [facility];  Service: Cardiovascular;;  . PERIPHERAL VASCULAR CATHETERIZATION Left 02/20/2015   Procedure: Pelvic Angiography;  Surgeon: Katha Cabal, MD;  Location:  [facility];  Service: Cardiovascular;  Laterality: Left;  . TRANSURETHRAL RESECTION OF BLADDER TUMOR WITH MITOMYCIN-C N/A 09/01/2016   Procedure: TRANSURETHRAL RESECTION OF BLADDER TUMOR WITH MITOMYCIN-C;  Surgeon: Hollice Espy, MD;  Location: [facility];  Service: Urology;  Laterality: N/A;    Home Medications:  Allergies as of 09/18/2016   No Known Allergies     Medication List       Accurate as of 09/18/16  1:28 PM. Always use your most recent med list.          ACCU-CHEK AVIVA PLUS test strip Generic drug:  glucose blood CHECK BLOOD SUGAR TWO TO THREE TIMES A DAY     acetaminophen 500 MG tablet Commonly known as:  TYLENOL Take 1,000 mg by mouth every 6 (six) hours as needed (for pain/headac.).   AMITIZA 24 MCG capsule Generic drug:  lubiprostone TAKE ONE CAPSULE BY MOUTH TWICE DAILY WITH A MEAL   amLODipine 5 MG tablet Commonly known as:  NORVASC Take 1 tablet (5 mg total) by mouth daily.   aspirin 81 MG chewable tablet Chew 81 mg by mouth daily.   atorvastatin 20 MG tablet Commonly known as:  LIPITOR Take 20 mg by mouth daily after supper.   B-D ULTRAFINE III SHORT PEN 31G X 8 MM Misc Generic drug:  Insulin Pen Needle TEST BLOOD SUGAR TWICE DAILY   B-D ULTRAFINE III SHORT PEN 31G X 8 MM Misc Generic drug:  Insulin Pen Needle TEST BLOOD SUGAR TWICE DAILY   benzonatate 100 MG capsule Commonly known as:  TESSALON Take 1 capsule (100 mg total) by mouth 2 (two) times daily as needed for cough.   clopidogrel 75 MG tablet Commonly known as:  PLAVIX TAKE 1 TABLET BY MOUTH EVERY DAY   DIGOX 0.125 MG tablet Generic drug:  digoxin TAKE 1 TABLET BY MOUTH EVERY DAY   gabapentin 600 MG tablet Commonly known as:  NEURONTIN Take 600 mg by mouth 3 (three) times daily.   HUMALOG MIX 75/25 KWIKPEN (75-25) 100 UNIT/ML Kwikpen Generic drug:  Insulin Lispro Prot & Lispro INJECT 30 UNITS UNDER THE SKIN EVERY MORNING AND 36 UNITS EVERY EVENING   HYDROcodone-acetaminophen 5-325 MG tablet Commonly known as:  NORCO/VICODIN Take 1-2 tablets by mouth every 6 (six) hours as needed for moderate pain.   Lactulose 20 GM/30ML Soln 30 ml every 4 hours until constipation is relieved   LEVEMIR FLEXTOUCH 100 UNIT/ML Pen Generic drug:  Insulin Detemir INJECT 40 UNITS UNDER THE SKIN EVERY DAY AT 10 PM   LORazepam 0.5 MG tablet Commonly known as:  ATIVAN TAKE 1 TABLET BY MOUTH AT BEDTIME, MAY REPEAT IN 1 HOUR IF NEEDED   mometasone 0.1 % lotion Commonly known as:  ELOCON Apply 1-2 application topically daily as needed (into ear canal for ear  irriation).   nystatin-triamcinolone ointment Commonly known as:  MYCOLOG Apply 1 application topically 2 (two) times daily. To left ear   traMADol 50 MG tablet Commonly known as:  ULTRAM Take 1 tablet (50 mg total) by mouth every 8 (eight) hours as needed.   TRUEPLUS LANCETS 28G Misc CHECK BLOOD SUGAR FOUR TIMES DAILY   Vitamin D3 2000 units Tabs Take 2,000 Units by mouth daily after supper.       Allergies: No Known Allergies  Family History: Family History  Problem Relation Age of Onset  . Cancer Mother 50    Lung Cancer  . Cancer Father 50    Lung Ca  .  Diabetes Son   . Breast cancer Maternal Grandmother     Social History:  reports that she quit smoking about 5 years ago. Her smoking use included Cigarettes. She has a 70.00 pack-year smoking history. She has never used smokeless tobacco. She reports that she does not drink alcohol or use drugs.  ROS: UROLOGY Frequent Urination?: No Hard to postpone urination?: No Burning/pain with urination?: No Get up at night to urinate?: No Leakage of urine?: No Urine stream starts and stops?: No Trouble starting stream?: No Do you have to strain to urinate?: No Blood in urine?: No Urinary tract infection?: No Sexually transmitted disease?: No Injury to kidneys or bladder?: No Painful intercourse?: No Currently pregnant?: No Vaginal bleeding?: No Last menstrual period?: n  Gastrointestinal Nausea?: No Vomiting?: No Indigestion/heartburn?: No Diarrhea?: No Constipation?: No  Constitutional Fever: No Night sweats?: No Weight loss?: No Fatigue?: No  Skin Skin rash/lesions?: No Itching?: No  Eyes Blurred vision?: No Double vision?: No  Ears/Nose/Throat Sore throat?: No Sinus problems?: No  Hematologic/Lymphatic Swollen glands?: No Easy bruising?: No  Cardiovascular Leg swelling?: No Chest pain?: No  Respiratory Cough?: No Shortness of breath?: No  Endocrine Excessive thirst?:  No  Musculoskeletal Back pain?: No Joint pain?: No  Neurological Headaches?: No Dizziness?: No  Psychologic Depression?: No Anxiety?: No  Physical Exam: BP (!) 146/64   Pulse 80   Ht 5\' 7"  (1.702 m)   Wt 167 lb (75.8 kg)   BMI 26.16 kg/m   Constitutional:  Alert and oriented, No acute distress.  Accompanied by sister today. HEENT: [place] AT, moist mucus membranes.  Trachea midline, no masses. Cardiovascular: No clubbing, cyanosis, or edema. Respiratory: Normal respiratory effort, no increased work of breathing. GI: Abdomen is soft, nontender, nondistended, no abdominal masses GU: No CVA tenderness.  Skin: No rashes, bruises or suspicious lesions. Neurologic: Grossly intact, no focal deficits, moving all 4 extremities. Psychiatric: Normal mood and affect.  Laboratory Data: Lab Results  Component Value Date   WBC 10.5 02/18/2016   HGB 12.2 09/01/2016   HCT 36.0 09/01/2016   MCV 87.0 02/18/2016   PLT 266.0 02/18/2016    Lab Results  Component Value Date   CREATININE 1.13 08/29/2016    Lab Results  Component Value Date   HGBA1C 6.8 (H) 05/21/2016    Urinalysis n/a  Pertinent Imaging: Most recent cross-sectional imaging the form of CT abdomen with and without contrast on 11/12/2015, retrograde pyelogram 08/2016   Assessment & Plan:    1. Malignant neoplasm of urinary bladder neck (HCC) Low-grade recurrence at the bladder neck and left hemitrigone, superficial Pathology reviewed with the patient  Given that she is relatively high risk history of left upper tract urothelial carcinoma, I recommended consideration course of BCG followed by maintenance for 1 year Risks of BCG were reviewed today in detail including risk of UTI, BCG sepsis, irritative voiding symptoms amongst others She understands that BCG may or may not decrease bladder cancer risk of progression She is interested in proceeding with this, will arrange for induction course Cysto q3 months  2.  Urothelial carcinoma of kidney, left (HCC) Cysto q 3 months as above Annual upper tract imaging   3. Solitary kidney Solitary kidney precautions reviewed again today   Return for BCG x 6 starting in 2-3 weeks, then cystoscopy in 3 months.  Hollice Espy, MD  [facility] [address] [place], [place] [address] [phone]  I spent 25 min with this patient of  which greater than 50% was spent in counseling and coordination of care with the patient.

## 2016-09-20 ENCOUNTER — Other Ambulatory Visit: Payer: Self-pay | Admitting: Internal Medicine

## 2016-09-23 ENCOUNTER — Other Ambulatory Visit: Payer: Self-pay | Admitting: Internal Medicine

## 2016-09-24 NOTE — Telephone Encounter (Signed)
Is it ok to go ahead and refill or would you like to wait until pt has labs drawn on 09/30/2016?  Refilled 01/20/16 Last OV: 07/16/2016 Next OV: 10/16/2016  Last A1C: 05/21/16

## 2016-09-25 ENCOUNTER — Ambulatory Visit
Admission: RE | Admit: 2016-09-25 | Discharge: 2016-09-25 | Disposition: A | Discharge status: Home / Self Care | Payer: Medicare Other | Source: Ambulatory Visit | Attending: Unknown Physician Specialty | Admitting: Unknown Physician Specialty

## 2016-09-25 DIAGNOSIS — E041 Nontoxic single thyroid nodule: Secondary | ICD-10-CM

## 2016-09-25 NOTE — Telephone Encounter (Signed)
No,  Don't want to take a chance on running out of insulin.  Will refill

## 2016-09-30 ENCOUNTER — Other Ambulatory Visit (INDEPENDENT_AMBULATORY_CARE_PROVIDER_SITE_OTHER): Payer: Medicare Other

## 2016-09-30 DIAGNOSIS — I10 Essential (primary) hypertension: Secondary | ICD-10-CM | POA: Diagnosis not present

## 2016-09-30 DIAGNOSIS — E1169 Type 2 diabetes mellitus with other specified complication: Secondary | ICD-10-CM

## 2016-09-30 DIAGNOSIS — E1151 Type 2 diabetes mellitus with diabetic peripheral angiopathy without gangrene: Secondary | ICD-10-CM

## 2016-09-30 DIAGNOSIS — E785 Hyperlipidemia, unspecified: Secondary | ICD-10-CM | POA: Diagnosis not present

## 2016-09-30 LAB — HEMOGLOBIN A1C: HEMOGLOBIN A1C: 7.5 % — AB (ref 4.6–6.5)

## 2016-09-30 LAB — LIPID PANEL
CHOL/HDL RATIO: 3
CHOLESTEROL: 100 mg/dL (ref 0–200)
HDL: 37.8 mg/dL — ABNORMAL LOW (ref >39.00)
LDL Cholesterol: 41 mg/dL (ref 0–99)
NonHDL: 62.26
TRIGLYCERIDES: 108 mg/dL (ref 0.0–149.0)
VLDL: 21.6 mg/dL (ref 0.0–40.0)

## 2016-09-30 LAB — BASIC METABOLIC PANEL
BUN: 25 mg/dL — ABNORMAL HIGH (ref 6–23)
CALCIUM: 9.1 mg/dL (ref 8.4–10.5)
CO2: 27 mEq/L (ref 19–32)
Chloride: 107 mEq/L (ref 96–112)
Creatinine, Ser: 1.05 mg/dL (ref 0.40–1.20)
GFR: 57.2 mL/min — AB (ref >60.00)
GLUCOSE: 168 mg/dL — AB (ref 70–99)
Potassium: 4.6 mEq/L (ref 3.5–5.1)
SODIUM: 139 meq/L (ref 135–145)

## 2016-09-30 LAB — COMPREHENSIVE METABOLIC PANEL
ALBUMIN: 3.7 g/dL (ref 3.5–5.2)
ALK PHOS: 97 U/L (ref 39–117)
ALT: 16 U/L (ref 0–35)
AST: 15 U/L (ref 0–37)
BUN: 25 mg/dL — ABNORMAL HIGH (ref 6–23)
CO2: 27 mEq/L (ref 19–32)
Calcium: 9.1 mg/dL (ref 8.4–10.5)
Chloride: 107 mEq/L (ref 96–112)
Creatinine, Ser: 1.05 mg/dL (ref 0.40–1.20)
GFR: 57.2 mL/min — AB (ref >60.00)
Glucose, Bld: 168 mg/dL — ABNORMAL HIGH (ref 70–99)
POTASSIUM: 4.6 meq/L (ref 3.5–5.1)
SODIUM: 139 meq/L (ref 135–145)
Total Bilirubin: 0.3 mg/dL (ref 0.2–1.2)
Total Protein: 6.2 g/dL (ref 6.0–8.3)

## 2016-09-30 LAB — LDL CHOLESTEROL, DIRECT: Direct LDL: 48 mg/dL

## 2016-09-30 LAB — MICROALBUMIN / CREATININE URINE RATIO
CREATININE, U: 71.3 mg/dL
MICROALB UR: 8.3 mg/dL — AB (ref 0.0–1.9)
MICROALB/CREAT RATIO: 11.6 mg/g (ref 0.0–30.0)

## 2016-10-01 ENCOUNTER — Telehealth: Payer: Self-pay | Admitting: Internal Medicine

## 2016-10-01 NOTE — Telephone Encounter (Signed)
Left pt message asking to call Allison back directly at [phone] to schedule AWV. Thanks! °

## 2016-10-02 ENCOUNTER — Ambulatory Visit (INDEPENDENT_AMBULATORY_CARE_PROVIDER_SITE_OTHER): Payer: Self-pay | Admitting: Vascular Surgery

## 2016-10-02 ENCOUNTER — Ambulatory Visit (INDEPENDENT_AMBULATORY_CARE_PROVIDER_SITE_OTHER): Payer: Medicare Other | Admitting: Vascular Surgery

## 2016-10-02 ENCOUNTER — Encounter (INDEPENDENT_AMBULATORY_CARE_PROVIDER_SITE_OTHER): Payer: Self-pay

## 2016-10-02 ENCOUNTER — Encounter: Payer: Self-pay | Admitting: Internal Medicine

## 2016-10-06 NOTE — Progress Notes (Signed)
10/07/2016 10:44 AM   Laura Reed 1961/01/13 [id number]  Referring provider: [name], MD [address] [place], [place] [address]  CC: Bladder cancer  Patient is a 56 year old Caucasian female who presents today to start an induction course of 6 weekly treatments of BCG's. This will be #1 out of 6.  Background history HPI:  56 yo F with history of pT1N0 left upper tract urothelial carcinoma s/p nephroU 4/016 with low grade Ta bladder recurrence s/p TURBT, mitomycin, right retrograde pyelogram.   1 - Left Upper Tract Transitional Cell Carcinoma - s/p LEFT robotic nephroureterectomy on 10/31/2014 as well as combined ventral hernia repair with Dr. Bary Castilla. Post complicated by wound infection now well healed. Pathology  pT1N0 with negative margins. Recent Surveillance: 02/2015 - cysto NED; 05/2015 cysto NED, 08/2015- cysto NED, 11/2015- NED, CT Urogram 11/12/15 negative, poor quality of delayed phase.; 5/2-17- cysto with mild erythema following recent UTI; 02/2016 NED; 08/2016 Lg Ta TCC recurrence,  1 cm, multifocal.  RTG neg.  2 - Solitary Kidney  - s/p left nephrectomy as per above. Most recent Cr 1.19 06/2016.  She does have an extensive smoking history, quit 5 years ago but smoked up to 2 packs a day for 35 years. She also has multiple medical co morbidities including history of diabetes, CAD status post CABG, carotid endarterectomy.    Today, she has read and signed the consent. Her questions are answered and she is ready to proceed. She has not had any recent dysuria, gross hematuria or suprapubic pain. She has not had any recent fevers, chills, nausea or vomiting. She has not had a recent cough. Her UA today demonstrates 6-10 WBC's and 3-10 RBC's.    PMH: Past Medical History:  Diagnosis Date  . Anxiety   . Cancer ([facility])   . CHF (congestive heart failure) ([facility])   . Coronary artery disease   . Diabetes mellitus   . Heart murmur   . Hemorrhoid   . Hypertension     . Neuropathy ([facility])   . PVD (peripheral vascular disease) ([facility])   . Urothelial carcinoma of kidney ([facility]) 10/31/2014   INVASIVE UROTHELIAL CARCINOMA, LOW GRADE. T1, Nx.    Surgical History: Past Surgical History:  Procedure Laterality Date  . AMPUTATION TOE    . ARTERIAL BYPASS SURGRY  2009, 2013 x 2   right leg , done in [place]  . CARDIAC CATHETERIZATION    . CAROTID ENDARTERECTOMY Right 01/2014   Dr Delana Meyer  . CATARACT EXTRACTION W/PHACO Right 12/14/2014   Procedure: CATARACT EXTRACTION PHACO AND INTRAOCULAR LENS PLACEMENT (IOC);  Surgeon: Lyla Glassing, MD;  Location: [facility];  Service: Ophthalmology;  Laterality: Right;  [place]   00:38.6              AP        7.1                   CDE  2.76  . CESAREAN SECTION    . CHOLECYSTECTOMY  03-03-12   Porcelain gallbladder, gallstones,  [facility]  . COLONOSCOPY W/ BIOPSIES  04/28/2012   Hyperplastic rectal polyps.  . CORONARY ARTERY BYPASS GRAFT  2009   3 vessel  . CYSTOSCOPY W/ RETROGRADES Right 09/01/2016   Procedure: CYSTOSCOPY WITH RETROGRADE PYELOGRAM;  Surgeon: Hollice Espy, MD;  Location: [facility];  Service: Urology;  Laterality: Right;  . HERNIA REPAIR  10-31-14   ventral, retro-rectus atrium mesh  . NEPHRECTOMY Left 10-31-14  .  PERIPHERAL VASCULAR CATHETERIZATION Left 05/01/2015   Procedure: Lower Extremity Angiography;  Surgeon: Katha Cabal, MD;  Location: [facility];  Service: Cardiovascular;  Laterality: Left;  . PERIPHERAL VASCULAR CATHETERIZATION  05/01/2015   Procedure: Lower Extremity Intervention;  Surgeon: Katha Cabal, MD;  Location: [facility];  Service: Cardiovascular;;  . PERIPHERAL VASCULAR CATHETERIZATION Left 02/20/2015   Procedure: Pelvic Angiography;  Surgeon: Katha Cabal, MD;  Location: [facility];  Service: Cardiovascular;  Laterality: Left;  . TRANSURETHRAL RESECTION OF BLADDER TUMOR WITH MITOMYCIN-C N/A 09/01/2016   Procedure: TRANSURETHRAL RESECTION OF BLADDER  TUMOR WITH MITOMYCIN-C;  Surgeon: Hollice Espy, MD;  Location: [facility];  Service: Urology;  Laterality: N/A;    Home Medications:  Allergies as of 10/07/2016   No Known Allergies     Medication List       Accurate as of 10/07/16 10:44 AM. Always use your most recent med list.          ACCU-CHEK AVIVA PLUS test strip Generic drug:  glucose blood CHECK BLOOD SUGAR TWO TO THREE TIMES A DAY   acetaminophen 500 MG tablet Commonly known as:  TYLENOL Take 1,000 mg by mouth every 6 (six) hours as needed (for pain/headac.).   AMITIZA 24 MCG capsule Generic drug:  lubiprostone TAKE ONE CAPSULE BY MOUTH TWICE DAILY WITH A MEAL   amLODipine 5 MG tablet Commonly known as:  NORVASC Take 1 tablet (5 mg total) by mouth daily.   aspirin 81 MG chewable tablet Chew 81 mg by mouth daily.   atorvastatin 20 MG tablet Commonly known as:  LIPITOR Take 20 mg by mouth daily after supper.   atorvastatin 20 MG tablet Commonly known as:  LIPITOR TAKE 1 TABLET BY MOUTH EVERY DAY   B-D ULTRAFINE III SHORT PEN 31G X 8 MM Misc Generic drug:  Insulin Pen Needle TEST BLOOD SUGAR TWICE DAILY   B-D ULTRAFINE III SHORT PEN 31G X 8 MM Misc Generic drug:  Insulin Pen Needle TEST BLOOD SUGAR TWICE DAILY   benzonatate 100 MG capsule Commonly known as:  TESSALON Take 1 capsule (100 mg total) by mouth 2 (two) times daily as needed for cough.   clopidogrel 75 MG tablet Commonly known as:  PLAVIX TAKE 1 TABLET BY MOUTH EVERY DAY   DIGOX 0.125 MG tablet Generic drug:  digoxin TAKE 1 TABLET BY MOUTH EVERY DAY   gabapentin 600 MG tablet Commonly known as:  NEURONTIN Take 600 mg by mouth 3 (three) times daily.   HUMALOG MIX 75/25 KWIKPEN (75-25) 100 UNIT/ML Kwikpen Generic drug:  Insulin Lispro Prot & Lispro INJECT 30 UNITS UNDER THE SKIN EVERY MORNING AND 36 UNITS EVERY EVENING   HUMALOG MIX 75/25 KWIKPEN (75-25) 100 UNIT/ML Kwikpen Generic drug:  Insulin Lispro Prot & Lispro INJECT 30 UNITS  UNDER THE SKIN EVERY MORNING AND 36 UNITS EVERY EVENING   HYDROcodone-acetaminophen 5-325 MG tablet Commonly known as:  NORCO/VICODIN Take 1-2 tablets by mouth every 6 (six) hours as needed for moderate pain.   Lactulose 20 GM/30ML Soln 30 ml every 4 hours until constipation is relieved   LEVEMIR FLEXTOUCH 100 UNIT/ML Pen Generic drug:  Insulin Detemir INJECT 40 UNITS UNDER THE SKIN EVERY DAY AT 10 PM   LORazepam 0.5 MG tablet Commonly known as:  ATIVAN TAKE 1 TABLET BY MOUTH AT BEDTIME, MAY REPEAT IN 1 HOUR IF NEEDED   mometasone 0.1 % lotion Commonly known as:  ELOCON Apply 1-2 application topically  daily as needed (into ear canal for ear irriation).   nystatin-triamcinolone ointment Commonly known as:  MYCOLOG Apply 1 application topically 2 (two) times daily. To left ear   traMADol 50 MG tablet Commonly known as:  ULTRAM Take 1 tablet (50 mg total) by mouth every 8 (eight) hours as needed.   TRUEPLUS LANCETS 28G Misc CHECK BLOOD SUGAR FOUR TIMES DAILY   Vitamin D3 2000 units Tabs Take 2,000 Units by mouth daily after supper.       Allergies: No Known Allergies  Family History: Family History  Problem Relation Age of Onset  . Cancer Mother 53    Lung Cancer  . Cancer Father 55    Lung Ca  . Diabetes Son   . Breast cancer Maternal Grandmother   . Kidney cancer Neg Hx   . Bladder Cancer Neg Hx     Social History:  reports that she quit smoking about 5 years ago. Her smoking use included Cigarettes. She has a 70.00 pack-year smoking history. She has never used smokeless tobacco. She reports that she does not drink alcohol or use drugs.  ROS: UROLOGY Frequent Urination?: No Hard to postpone urination?: No Burning/pain with urination?: No Get up at night to urinate?: No Leakage of urine?: No Urine stream starts and stops?: No Trouble starting stream?: No Do you have to strain to urinate?: No Blood in urine?: No Urinary tract infection?: No Sexually  transmitted disease?: No Injury to kidneys or bladder?: No Painful intercourse?: No Weak stream?: No Currently pregnant?: No Vaginal bleeding?: No Last menstrual period?: n  Gastrointestinal Nausea?: No Vomiting?: No Indigestion/heartburn?: No Diarrhea?: No Constipation?: No  Constitutional Fever: No Night sweats?: No Weight loss?: No Fatigue?: No  Skin Skin rash/lesions?: No Itching?: No  Eyes Blurred vision?: No Double vision?: No  Ears/Nose/Throat Sore throat?: No Sinus problems?: No  Hematologic/Lymphatic Swollen glands?: No Easy bruising?: No  Cardiovascular Leg swelling?: No Chest pain?: No  Respiratory Cough?: No Shortness of breath?: No  Endocrine Excessive thirst?: No  Musculoskeletal Back pain?: No Joint pain?: No  Neurological Headaches?: No Dizziness?: No  Psychologic Depression?: No Anxiety?: No  Physical Exam: BP 108/64   Pulse 78   Ht 5\' 7"  (1.702 m)   Wt 170 lb (77.1 kg)   BMI 26.63 kg/m   Constitutional:  Alert and oriented, No acute distress.  Accompanied by sister today. HEENT: [address] AT, moist mucus membranes.  Trachea midline, no masses. Cardiovascular: No clubbing, cyanosis, or edema. Respiratory: Normal respiratory effort, no increased work of breathing. GI: Abdomen is soft, nontender, nondistended, no abdominal masses GU: No CVA tenderness.  Skin: No rashes, bruises or suspicious lesions. Neurologic: Grossly intact, no focal deficits, moving all 4 extremities. Psychiatric: Normal mood and affect.  Laboratory Data: Lab Results  Component Value Date   WBC 10.5 02/18/2016   HGB 12.2 09/01/2016   HCT 36.0 09/01/2016   MCV 87.0 02/18/2016   PLT 266.0 02/18/2016    Lab Results  Component Value Date   CREATININE 1.05 09/30/2016   CREATININE 1.05 09/30/2016    Lab Results  Component Value Date   HGBA1C 7.5 (H) 09/30/2016    Urinalysis 6-10 WBC's and 3-10 RBC's.  See EPIC.    Pertinent Imaging: Most  recent cross-sectional imaging the form of CT abdomen with and without contrast on 11/12/2015, retrograde pyelogram 08/2016   Assessment & Plan:    1. Malignant neoplasm of urinary bladder neck (HCC) Low-grade recurrence at the bladder neck and left  hemitrigone, superficial Pathology reviewed with the patient  Given that she is relatively high risk history of left upper tract urothelial carcinoma, I recommended consideration course of BCG followed by maintenance for 1 year Risks of BCG were reviewed today in detail including risk of UTI, BCG sepsis, irritative voiding symptoms amongst others She understands that BCG may or may not decrease bladder cancer risk of progression She is interested in proceeding with this, will arrange for induction course Cysto q3 months  - Reviewed BCG treatment course, possible side effects including BCG sepsis, bladder irritation, worsening of her urinary symptoms  - # 1 of 6 BCG installed today  - Patient was instructed to pour bleach down her toilet for the next 6 hours  -  Instructed to call the office if she should experience fevers greater than 102, chills/rigors, onset of a new cough, night sweats or further bladder spasms or inability to urinate   - RTC in one week for # 2 BCG  - Surveillance protocol also discussed today including cystoscopy every 3 months for at least 2 years and then spread out thereafter  - Urinalysis, Complete  - bcg vaccine injection 81 mg; Instill 3.24 mLs (81 mg total) into the bladder once.  - lidocaine (XYLOCAINE) 2 % jelly 1 application; Place 1 application into the urethra once.   2. Urothelial carcinoma of kidney, left ([place]) Cysto q 3 months as above Annual upper tract imaging   3. Solitary kidney Solitary kidney precautions reviewed again today   Return in about 1 week (around 10/14/2016) for # 2 BCG.  Zara Council, PA-C  [facility] [address] [place], [place]  [address] [phone]  I spent 25 min with this patient of which greater than 50% was spent in counseling and coordination of care with the patient.

## 2016-10-07 ENCOUNTER — Encounter: Payer: Self-pay | Admitting: Urology

## 2016-10-07 ENCOUNTER — Ambulatory Visit (INDEPENDENT_AMBULATORY_CARE_PROVIDER_SITE_OTHER): Payer: Medicare Other | Admitting: Urology

## 2016-10-07 VITALS — BP 108/64 | HR 78 | Ht 67.0 in | Wt 170.0 lb

## 2016-10-07 DIAGNOSIS — C675 Malignant neoplasm of bladder neck: Secondary | ICD-10-CM | POA: Diagnosis not present

## 2016-10-07 DIAGNOSIS — Q600 Renal agenesis, unilateral: Secondary | ICD-10-CM | POA: Diagnosis not present

## 2016-10-07 DIAGNOSIS — IMO0002 Reserved for concepts with insufficient information to code with codable children: Secondary | ICD-10-CM

## 2016-10-07 DIAGNOSIS — C642 Malignant neoplasm of left kidney, except renal pelvis: Secondary | ICD-10-CM

## 2016-10-07 LAB — URINALYSIS, COMPLETE
BILIRUBIN UA: NEGATIVE
KETONES UA: NEGATIVE
NITRITE UA: NEGATIVE
SPEC GRAV UA: 1.01 (ref 1.005–1.030)
UUROB: 0.2 mg/dL (ref 0.2–1.0)
pH, UA: 6 (ref 5.0–7.5)

## 2016-10-07 LAB — MICROSCOPIC EXAMINATION

## 2016-10-07 MED ORDER — BCG LIVE 50 MG IS SUSR
3.2400 mL | Freq: Once | INTRAVESICAL | Status: AC
  Administered 2016-10-07: 81 mg via INTRAVESICAL

## 2016-10-07 MED ORDER — LIDOCAINE HCL 2 % EX GEL
1.0000 "application " | Freq: Once | CUTANEOUS | Status: AC
  Administered 2016-10-07: 1 via URETHRAL

## 2016-10-07 NOTE — Progress Notes (Signed)
BCG Bladder Instillation  BCG # 1  Due to Bladder Cancer patient is present today for a BCG treatment. Patient was cleaned and prepped in a sterile fashion with betadine and lidocaine 2% jelly was instilled into the urethra.  A 14FR catheter was inserted, urine return was noted 24ml, urine was clear in color.  16ml of reconstituted BCG was instilled into the bladder. The catheter was then removed. Patient tolerated well, no complications were noted.  Preformed by: Zara Council PA-C and Lyndee Hensen CMA  Follow up/ Additional notes: One week

## 2016-10-07 NOTE — Addendum Note (Signed)
Addended by: Orlene Erm on: 10/07/2016 12:03 PM   Modules accepted: Orders

## 2016-10-13 NOTE — Progress Notes (Signed)
10/14/2016 11:14 AM   Laura Reed December 26, 1960 [id number]  Referring provider: [name], MD [address] [place], [place] [address]  CC: Bladder cancer  Patient is a 56 year old Caucasian female who presents today to start an induction course of 6 weekly treatments of BCG's. This will be #2 out of 6.  Background history HPI:  56 yo F with history of pT1N0 left upper tract urothelial carcinoma s/p nephroU 4/016 with low grade Ta bladder recurrence s/p TURBT, mitomycin, right retrograde pyelogram.   1 - Left Upper Tract Transitional Cell Carcinoma - s/p LEFT robotic nephroureterectomy on 10/31/2014 as well as combined ventral hernia repair with Dr. Bary Castilla. Post complicated by wound infection now well healed. Pathology  pT1N0 with negative margins. Recent Surveillance: 02/2015 - cysto NED; 05/2015 cysto NED, 08/2015- cysto NED, 11/2015- NED, CT Urogram 11/12/15 negative, poor quality of delayed phase.; 5/2-17- cysto with mild erythema following recent UTI; 02/2016 NED; 08/2016 Lg Ta TCC recurrence,  1 cm, multifocal.  RTG neg.  2 - Solitary Kidney  - s/p left nephrectomy as per above. Most recent Cr 1.19 06/2016.  She does have an extensive smoking history, quit 5 years ago but smoked up to 2 packs a day for 35 years. She also has multiple medical co morbidities including history of diabetes, CAD status post CABG, carotid endarterectomy.    Today, she is having no urinary complaints.  She was able to hold her last instillation for two hours. She had some low-grade fevers for 2 days after the installation, but no other untoward side effects.  She has not had any recent dysuria, gross hematuria or suprapubic pain. She has not had any recent fevers, chills, nausea or vomiting. She has not had a recent cough. Her UA today demonstrates 6-10 WBC's.    PMH: Past Medical History:  Diagnosis Date  . Anxiety   . Cancer ([place])   . CHF (congestive heart failure) ([place])   . Coronary  artery disease   . Diabetes mellitus   . Heart murmur   . Hemorrhoid   . Hypertension   . Neuropathy ([place])   . PVD (peripheral vascular disease) ([place])   . Urothelial carcinoma of kidney ([place]) 10/31/2014   INVASIVE UROTHELIAL CARCINOMA, LOW GRADE. T1, Nx.    Surgical History: Past Surgical History:  Procedure Laterality Date  . AMPUTATION TOE    . ARTERIAL BYPASS SURGRY  2009, 2013 x 2   right leg , done in [place]  . CARDIAC CATHETERIZATION    . CAROTID ENDARTERECTOMY Right 01/2014   Dr Delana Meyer  . CATARACT EXTRACTION W/PHACO Right 12/14/2014   Procedure: CATARACT EXTRACTION PHACO AND INTRAOCULAR LENS PLACEMENT (IOC);  Surgeon: Lyla Glassing, MD;  Location: [facility];  Service: Ophthalmology;  Laterality: Right;  [place]   00:38.6              AP        7.1                   CDE  2.76  . CESAREAN SECTION    . CHOLECYSTECTOMY  03-03-12   Porcelain gallbladder, gallstones,  [place]  . COLONOSCOPY W/ BIOPSIES  04/28/2012   Hyperplastic rectal polyps.  . CORONARY ARTERY BYPASS GRAFT  2009   3 vessel  . CYSTOSCOPY W/ RETROGRADES Right 09/01/2016   Procedure: CYSTOSCOPY WITH RETROGRADE PYELOGRAM;  Surgeon: Hollice Espy, MD;  Location: [facility];  Service: Urology;  Laterality: Right;  . HERNIA REPAIR  10-31-14   ventral, retro-rectus atrium mesh  . NEPHRECTOMY Left 10-31-14  . PERIPHERAL VASCULAR CATHETERIZATION Left 05/01/2015   Procedure: Lower Extremity Angiography;  Surgeon: Katha Cabal, MD;  Location: [facility];  Service: Cardiovascular;  Laterality: Left;  . PERIPHERAL VASCULAR CATHETERIZATION  05/01/2015   Procedure: Lower Extremity Intervention;  Surgeon: Katha Cabal, MD;  Location: [facility];  Service: Cardiovascular;;  . PERIPHERAL VASCULAR CATHETERIZATION Left 02/20/2015   Procedure: Pelvic Angiography;  Surgeon: Katha Cabal, MD;  Location: [facility];  Service: Cardiovascular;  Laterality: Left;  . TRANSURETHRAL RESECTION OF BLADDER  TUMOR WITH MITOMYCIN-C N/A 09/01/2016   Procedure: TRANSURETHRAL RESECTION OF BLADDER TUMOR WITH MITOMYCIN-C;  Surgeon: Hollice Espy, MD;  Location: [facility];  Service: Urology;  Laterality: N/A;    Home Medications:  Allergies as of 10/14/2016   No Known Allergies     Medication List       Accurate as of 10/14/16 11:14 AM. Always use your most recent med list.          ACCU-CHEK AVIVA PLUS test strip Generic drug:  glucose blood CHECK BLOOD SUGAR TWO TO THREE TIMES A DAY   acetaminophen 500 MG tablet Commonly known as:  TYLENOL Take 1,000 mg by mouth every 6 (six) hours as needed (for pain/headac.).   AMITIZA 24 MCG capsule Generic drug:  lubiprostone TAKE ONE CAPSULE BY MOUTH TWICE DAILY WITH A MEAL   amLODipine 5 MG tablet Commonly known as:  NORVASC Take 1 tablet (5 mg total) by mouth daily.   aspirin 81 MG chewable tablet Chew 81 mg by mouth daily.   atorvastatin 20 MG tablet Commonly known as:  LIPITOR Take 20 mg by mouth daily after supper.   atorvastatin 20 MG tablet Commonly known as:  LIPITOR TAKE 1 TABLET BY MOUTH EVERY DAY   B-D ULTRAFINE III SHORT PEN 31G X 8 MM Misc Generic drug:  Insulin Pen Needle TEST BLOOD SUGAR TWICE DAILY   B-D ULTRAFINE III SHORT PEN 31G X 8 MM Misc Generic drug:  Insulin Pen Needle TEST BLOOD SUGAR TWICE DAILY   benzonatate 100 MG capsule Commonly known as:  TESSALON Take 1 capsule (100 mg total) by mouth 2 (two) times daily as needed for cough.   clopidogrel 75 MG tablet Commonly known as:  PLAVIX TAKE 1 TABLET BY MOUTH EVERY DAY   DIGOX 0.125 MG tablet Generic drug:  digoxin TAKE 1 TABLET BY MOUTH EVERY DAY   gabapentin 600 MG tablet Commonly known as:  NEURONTIN Take 600 mg by mouth 3 (three) times daily.   HUMALOG MIX 75/25 KWIKPEN (75-25) 100 UNIT/ML Kwikpen Generic drug:  Insulin Lispro Prot & Lispro INJECT 30 UNITS UNDER THE SKIN EVERY MORNING AND 36 UNITS EVERY EVENING   HUMALOG MIX 75/25 KWIKPEN  (75-25) 100 UNIT/ML Kwikpen Generic drug:  Insulin Lispro Prot & Lispro INJECT 30 UNITS UNDER THE SKIN EVERY MORNING AND 36 UNITS EVERY EVENING   HYDROcodone-acetaminophen 5-325 MG tablet Commonly known as:  NORCO/VICODIN Take 1-2 tablets by mouth every 6 (six) hours as needed for moderate pain.   Lactulose 20 GM/30ML Soln 30 ml every 4 hours until constipation is relieved   LEVEMIR FLEXTOUCH 100 UNIT/ML Pen Generic drug:  Insulin Detemir INJECT 40 UNITS UNDER THE SKIN EVERY DAY AT 10 PM   LORazepam 0.5 MG tablet Commonly known as:  ATIVAN TAKE 1 TABLET BY MOUTH AT BEDTIME, MAY REPEAT IN 1 HOUR IF NEEDED  mometasone 0.1 % lotion Commonly known as:  ELOCON Apply 1-2 application topically daily as needed (into ear canal for ear irriation).   nystatin-triamcinolone ointment Commonly known as:  MYCOLOG Apply 1 application topically 2 (two) times daily. To left ear   traMADol 50 MG tablet Commonly known as:  ULTRAM Take 1 tablet (50 mg total) by mouth every 8 (eight) hours as needed.   TRUEPLUS LANCETS 28G Misc CHECK BLOOD SUGAR FOUR TIMES DAILY   Vitamin D3 2000 units Tabs Take 2,000 Units by mouth daily after supper.       Allergies: No Known Allergies  Family History: Family History  Problem Relation Age of Onset  . Cancer Mother 32    Lung Cancer  . Cancer Father 47    Lung Ca  . Diabetes Son   . Breast cancer Maternal Grandmother   . Kidney cancer Neg Hx   . Bladder Cancer Neg Hx   . Prostate cancer Neg Hx     Social History:  reports that she quit smoking about 5 years ago. Her smoking use included Cigarettes. She has a 70.00 pack-year smoking history. She has never used smokeless tobacco. She reports that she does not drink alcohol or use drugs.  ROS: UROLOGY Frequent Urination?: No Hard to postpone urination?: No Burning/pain with urination?: No Get up at night to urinate?: No Leakage of urine?: No Urine stream starts and stops?: No Trouble  starting stream?: No Do you have to strain to urinate?: No Blood in urine?: No Urinary tract infection?: No Sexually transmitted disease?: No Injury to kidneys or bladder?: No Painful intercourse?: No Weak stream?: No Currently pregnant?: No Vaginal bleeding?: No Last menstrual period?: n  Gastrointestinal Nausea?: No Vomiting?: No Indigestion/heartburn?: No Diarrhea?: No Constipation?: No  Constitutional Fever: No Night sweats?: No Weight loss?: No Fatigue?: No  Skin Skin rash/lesions?: No Itching?: No  Eyes Blurred vision?: No Double vision?: No  Ears/Nose/Throat Sore throat?: No Sinus problems?: No  Hematologic/Lymphatic Swollen glands?: No Easy bruising?: No  Cardiovascular Leg swelling?: No Chest pain?: No  Respiratory Cough?: No Shortness of breath?: No  Endocrine Excessive thirst?: No  Musculoskeletal Back pain?: No Joint pain?: No  Neurological Headaches?: No Dizziness?: No  Psychologic Depression?: No Anxiety?: No  Physical Exam: BP (!) 153/73   Pulse 79   Ht 5\' 7"  (1.702 m)   Wt 170 lb 6.4 oz (77.3 kg)   BMI 26.69 kg/m   Constitutional:  Alert and oriented, No acute distress.  Accompanied by sister today. HEENT: [place] AT, moist mucus membranes.  Trachea midline, no masses. Cardiovascular: No clubbing, cyanosis, or edema. Respiratory: Normal respiratory effort, no increased work of breathing. GI: Abdomen is soft, nontender, nondistended, no abdominal masses GU: No CVA tenderness.  Skin: No rashes, bruises or suspicious lesions. Neurologic: Grossly intact, no focal deficits, moving all 4 extremities. Psychiatric: Normal mood and affect.  Laboratory Data: Lab Results  Component Value Date   WBC 10.5 02/18/2016   HGB 12.2 09/01/2016   HCT 36.0 09/01/2016   MCV 87.0 02/18/2016   PLT 266.0 02/18/2016    Lab Results  Component Value Date   CREATININE 1.05 09/30/2016   CREATININE 1.05 09/30/2016    Lab Results  Component  Value Date   HGBA1C 7.5 (H) 09/30/2016    Urinalysis 6-10 WBC's.  See EPIC.    Pertinent Imaging: Most recent cross-sectional imaging the form of CT abdomen with and without contrast on 11/12/2015, retrograde pyelogram 08/2016   Assessment &  Plan:    1. Malignant neoplasm of urinary bladder neck (HCC) Low-grade recurrence at the bladder neck and left hemitrigone, superficial Pathology reviewed with the patient  Given that she is relatively high risk history of left upper tract urothelial carcinoma, I recommended consideration course of BCG followed by maintenance for 1 year Risks of BCG were reviewed today in detail including risk of UTI, BCG sepsis, irritative voiding symptoms amongst others She understands that BCG may or may not decrease bladder cancer risk of progression She is interested in proceeding with this, will arrange for induction course Cysto q3 months  - Reviewed BCG treatment course, possible side effects including BCG sepsis, bladder irritation, worsening of her urinary symptoms  - # 2 of 6 BCG installed today  - Patient was instructed to pour bleach down her toilet for the next 6 hours  -  Instructed to call the office if she should experience fevers greater than 102, chills/rigors, onset of a new cough, night sweats or further bladder spasms or inability to urinate   - RTC in one week for # 3 BCG  - Surveillance protocol also discussed today including cystoscopy every 3 months for at least 2 years and then spread out thereafter  - Urinalysis, Complete  - bcg vaccine injection 81 mg; Instill 3.24 mLs (81 mg total) into the bladder once.  - lidocaine (XYLOCAINE) 2 % jelly 1 application; Place 1 application into the urethra once.   2. Urothelial carcinoma of kidney, left ([place]) Cysto q 3 months as above Annual upper tract imaging   3. Solitary kidney Solitary kidney precautions reviewed again today   Return in about 1 week (around 10/21/2016) for #.  Zara Council, [facility] [address], [place], [place] [address] [phone]

## 2016-10-14 ENCOUNTER — Ambulatory Visit (INDEPENDENT_AMBULATORY_CARE_PROVIDER_SITE_OTHER): Payer: Medicare Other | Admitting: Urology

## 2016-10-14 ENCOUNTER — Encounter: Payer: Self-pay | Admitting: Urology

## 2016-10-14 VITALS — BP 153/73 | HR 79 | Ht 67.0 in | Wt 170.4 lb

## 2016-10-14 DIAGNOSIS — C675 Malignant neoplasm of bladder neck: Secondary | ICD-10-CM

## 2016-10-14 DIAGNOSIS — Q600 Renal agenesis, unilateral: Secondary | ICD-10-CM

## 2016-10-14 DIAGNOSIS — C642 Malignant neoplasm of left kidney, except renal pelvis: Secondary | ICD-10-CM

## 2016-10-14 DIAGNOSIS — IMO0002 Reserved for concepts with insufficient information to code with codable children: Secondary | ICD-10-CM

## 2016-10-14 LAB — URINALYSIS, COMPLETE
Bilirubin, UA: NEGATIVE
Glucose, UA: NEGATIVE
KETONES UA: NEGATIVE
NITRITE UA: NEGATIVE
Protein, UA: NEGATIVE
SPEC GRAV UA: 1.01 (ref 1.005–1.030)
UUROB: 0.2 mg/dL (ref 0.2–1.0)
pH, UA: 5 (ref 5.0–7.5)

## 2016-10-14 LAB — MICROSCOPIC EXAMINATION: RBC MICROSCOPIC, UA: NONE SEEN /HPF (ref 0–?)

## 2016-10-14 MED ORDER — LIDOCAINE HCL 2 % EX GEL
1.0000 "application " | Freq: Once | CUTANEOUS | Status: AC
  Administered 2016-10-14: 1 via URETHRAL

## 2016-10-14 MED ORDER — BCG LIVE 50 MG IS SUSR
3.2400 mL | Freq: Once | INTRAVESICAL | Status: AC
  Administered 2016-10-14: 81 mg via INTRAVESICAL

## 2016-10-14 NOTE — Progress Notes (Signed)
BCG Bladder Instillation  BCG # 2  Due to Bladder Cancer patient is present today for a BCG treatment. Patient was cleaned and prepped in a sterile fashion with betadine and lidocaine 2% jelly was instilled into the urethra.  A 14FR catheter was inserted, urine return was noted 200ml, urine was yellow  in color.  50ml of reconstituted BCG was instilled into the bladder. The catheter was then removed. Patient tolerated well, no complications were noted  Preformed by: Shannon McGowan PA-C and Ramona Williams CMA  Follow up/ Additional notes: One week   

## 2016-10-14 NOTE — Addendum Note (Signed)
Addended by: Orlene Erm on: 10/14/2016 11:28 AM   Modules accepted: Orders

## 2016-10-16 ENCOUNTER — Ambulatory Visit: Payer: Medicare Other | Admitting: Internal Medicine

## 2016-10-16 ENCOUNTER — Telehealth: Payer: Self-pay | Admitting: Internal Medicine

## 2016-10-16 NOTE — Telephone Encounter (Signed)
FYI, Pt will not be coming in for her appt this morning at 9am. Pt states that she woke up late. Do you want me to cancel appt? Pt did resch appt. Thank you!

## 2016-10-16 NOTE — Telephone Encounter (Signed)
I appreciate her honesty,  But she will still be charged a no show fee.

## 2016-10-16 NOTE — Telephone Encounter (Signed)
Please advise 

## 2016-10-16 NOTE — Telephone Encounter (Signed)
Laura Reed has been let known.

## 2016-10-20 NOTE — Progress Notes (Signed)
10/21/2016 11:23 AM   [name] July 09, 1960 [id number]  Referring provider: [name], MD [address] [place], [place] [address]  CC: Bladder cancer  Patient is a 56 year old Caucasian female who presents today to start an induction course of 6 weekly treatments of BCG's. This will be #3 out of 6.  Background history HPI:  56 yo F with history of pT1N0 left upper tract urothelial carcinoma s/p nephroU 4/016 with low grade Ta bladder recurrence s/p TURBT, mitomycin, right retrograde pyelogram.   1 - Left Upper Tract Transitional Cell Carcinoma - s/p LEFT robotic nephroureterectomy on 10/31/2014 as well as combined ventral hernia repair with Dr. Bary Castilla. Post complicated by wound infection now well healed. Pathology  pT1N0 with negative margins. Recent Surveillance: 02/2015 - cysto NED; 05/2015 cysto NED, 08/2015- cysto NED, 11/2015- NED, CT Urogram 11/12/15 negative, poor quality of delayed phase.; 5/2-17- cysto with mild erythema following recent UTI; 02/2016 NED; 08/2016 Lg Ta TCC recurrence,  1 cm, multifocal.  RTG neg.  2 - Solitary Kidney  - s/p left nephrectomy as per above. Most recent Cr 1.19 06/2016.  She does have an extensive smoking history, quit 5 years ago but smoked up to 2 packs a day for 35 years. She also has multiple medical co morbidities including history of diabetes, CAD status post CABG, carotid endarterectomy.    Today, she is having no urinary complaints.  She was able to hold her last instillation for two hours. She had some low-grade fevers for 2 days after the installation, but no other untoward side effects.  She has not had any recent dysuria, gross hematuria or suprapubic pain. She has not had any recent fevers, chills, nausea or vomiting. She has not had a recent cough. Her UA today demonstrates 0-5 WBC's.    PMH: Past Medical History:  Diagnosis Date  . Anxiety   . Cancer ([place])   . CHF (congestive heart failure) ([place])   . Coronary  artery disease   . Diabetes mellitus   . Heart murmur   . Hemorrhoid   . Hypertension   . Neuropathy   . PVD (peripheral vascular disease) ([place])   . Urothelial carcinoma of kidney ([place]) 10/31/2014   INVASIVE UROTHELIAL CARCINOMA, LOW GRADE. T1, Nx.    Surgical History: Past Surgical History:  Procedure Laterality Date  . AMPUTATION TOE    . ARTERIAL BYPASS SURGRY  2009, 2013 x 2   right leg , done in [place]  . CARDIAC CATHETERIZATION    . CAROTID ENDARTERECTOMY Right 01/2014   Dr Delana Meyer  . CATARACT EXTRACTION W/PHACO Right 12/14/2014   Procedure: CATARACT EXTRACTION PHACO AND INTRAOCULAR LENS PLACEMENT (IOC);  Surgeon: Lyla Glassing, MD;  Location: [facility];  Service: Ophthalmology;  Laterality: Right;  [place]   00:38.6              AP        7.1                   CDE  2.76  . CESAREAN SECTION    . CHOLECYSTECTOMY  03-03-12   Porcelain gallbladder, gallstones,  [place]  . COLONOSCOPY W/ BIOPSIES  04/28/2012   Hyperplastic rectal polyps.  . CORONARY ARTERY BYPASS GRAFT  2009   3 vessel  . CYSTOSCOPY W/ RETROGRADES Right 09/01/2016   Procedure: CYSTOSCOPY WITH RETROGRADE PYELOGRAM;  Surgeon: Hollice Espy, MD;  Location: [facility];  Service: Urology;  Laterality: Right;  . HERNIA REPAIR  10-31-14   ventral, retro-rectus atrium mesh  . NEPHRECTOMY Left 10-31-14  . PERIPHERAL VASCULAR CATHETERIZATION Left 05/01/2015   Procedure: Lower Extremity Angiography;  Surgeon: Katha Cabal, MD;  Location: [facility];  Service: Cardiovascular;  Laterality: Left;  . PERIPHERAL VASCULAR CATHETERIZATION  05/01/2015   Procedure: Lower Extremity Intervention;  Surgeon: Katha Cabal, MD;  Location: [facility];  Service: Cardiovascular;;  . PERIPHERAL VASCULAR CATHETERIZATION Left 02/20/2015   Procedure: Pelvic Angiography;  Surgeon: Katha Cabal, MD;  Location: [facility];  Service: Cardiovascular;  Laterality: Left;  . TRANSURETHRAL RESECTION OF BLADDER TUMOR  WITH MITOMYCIN-C N/A 09/01/2016   Procedure: TRANSURETHRAL RESECTION OF BLADDER TUMOR WITH MITOMYCIN-C;  Surgeon: Hollice Espy, MD;  Location: [facility];  Service: Urology;  Laterality: N/A;    Home Medications:  Allergies as of 10/21/2016   No Known Allergies     Medication List       Accurate as of 10/21/16 11:23 AM. Always use your most recent med list.          ACCU-CHEK AVIVA PLUS test strip Generic drug:  glucose blood CHECK BLOOD SUGAR TWO TO THREE TIMES A DAY   acetaminophen 500 MG tablet Commonly known as:  TYLENOL Take 1,000 mg by mouth every 6 (six) hours as needed (for pain/headac.).   AMITIZA 24 MCG capsule Generic drug:  lubiprostone TAKE ONE CAPSULE BY MOUTH TWICE DAILY WITH A MEAL   amLODipine 5 MG tablet Commonly known as:  NORVASC Take 1 tablet (5 mg total) by mouth daily.   aspirin 81 MG chewable tablet Chew 81 mg by mouth daily.   atorvastatin 20 MG tablet Commonly known as:  LIPITOR Take 20 mg by mouth daily after supper.   atorvastatin 20 MG tablet Commonly known as:  LIPITOR TAKE 1 TABLET BY MOUTH EVERY DAY   B-D ULTRAFINE III SHORT PEN 31G X 8 MM Misc Generic drug:  Insulin Pen Needle TEST BLOOD SUGAR TWICE DAILY   B-D ULTRAFINE III SHORT PEN 31G X 8 MM Misc Generic drug:  Insulin Pen Needle TEST BLOOD SUGAR TWICE DAILY   benzonatate 100 MG capsule Commonly known as:  TESSALON Take 1 capsule (100 mg total) by mouth 2 (two) times daily as needed for cough.   clopidogrel 75 MG tablet Commonly known as:  PLAVIX TAKE 1 TABLET BY MOUTH EVERY DAY   DIGOX 0.125 MG tablet Generic drug:  digoxin TAKE 1 TABLET BY MOUTH EVERY DAY   gabapentin 600 MG tablet Commonly known as:  NEURONTIN Take 600 mg by mouth 3 (three) times daily.   HUMALOG MIX 75/25 KWIKPEN (75-25) 100 UNIT/ML Kwikpen Generic drug:  Insulin Lispro Prot & Lispro INJECT 30 UNITS UNDER THE SKIN EVERY MORNING AND 36 UNITS EVERY EVENING   HUMALOG MIX 75/25 KWIKPEN (75-25)  100 UNIT/ML Kwikpen Generic drug:  Insulin Lispro Prot & Lispro INJECT 30 UNITS UNDER THE SKIN EVERY MORNING AND 36 UNITS EVERY EVENING   HYDROcodone-acetaminophen 5-325 MG tablet Commonly known as:  NORCO/VICODIN Take 1-2 tablets by mouth every 6 (six) hours as needed for moderate pain.   Lactulose 20 GM/30ML Soln 30 ml every 4 hours until constipation is relieved   LEVEMIR FLEXTOUCH 100 UNIT/ML Pen Generic drug:  Insulin Detemir INJECT 40 UNITS UNDER THE SKIN EVERY DAY AT 10 PM   LORazepam 0.5 MG tablet Commonly known as:  ATIVAN TAKE 1 TABLET BY MOUTH AT BEDTIME, MAY REPEAT IN 1 HOUR IF NEEDED  mometasone 0.1 % lotion Commonly known as:  ELOCON Apply 1-2 application topically daily as needed (into ear canal for ear irriation).   nystatin-triamcinolone ointment Commonly known as:  MYCOLOG Apply 1 application topically 2 (two) times daily. To left ear   traMADol 50 MG tablet Commonly known as:  ULTRAM Take 1 tablet (50 mg total) by mouth every 8 (eight) hours as needed.   TRUEPLUS LANCETS 28G Misc CHECK BLOOD SUGAR FOUR TIMES DAILY   Vitamin D3 2000 units Tabs Take 2,000 Units by mouth daily after supper.       Allergies: No Known Allergies  Family History: Family History  Problem Relation Age of Onset  . Cancer Mother 59    Lung Cancer  . Cancer Father 38    Lung Ca  . Diabetes Son   . Breast cancer Maternal Grandmother   . Kidney cancer Neg Hx   . Bladder Cancer Neg Hx   . Prostate cancer Neg Hx     Social History:  reports that she quit smoking about 5 years ago. Her smoking use included Cigarettes. She has a 70.00 pack-year smoking history. She has never used smokeless tobacco. She reports that she does not drink alcohol or use drugs.  ROS: UROLOGY Frequent Urination?: No Hard to postpone urination?: No Burning/pain with urination?: No Get up at night to urinate?: No Leakage of urine?: No Urine stream starts and stops?: No Trouble starting  stream?: No Do you have to strain to urinate?: No Blood in urine?: No Urinary tract infection?: No Sexually transmitted disease?: No Injury to kidneys or bladder?: No Painful intercourse?: No Weak stream?: No Currently pregnant?: No Vaginal bleeding?: No Last menstrual period?: n  Gastrointestinal Nausea?: No Vomiting?: No Indigestion/heartburn?: No Diarrhea?: No Constipation?: No  Constitutional Fever: No Night sweats?: No Weight loss?: No Fatigue?: No  Skin Skin rash/lesions?: No Itching?: No  Eyes Blurred vision?: No Double vision?: No  Ears/Nose/Throat Sore throat?: No Sinus problems?: No  Hematologic/Lymphatic Swollen glands?: No Easy bruising?: No  Cardiovascular Leg swelling?: No Chest pain?: No  Respiratory Cough?: No Shortness of breath?: No  Endocrine Excessive thirst?: No  Musculoskeletal Back pain?: No Joint pain?: No  Neurological Headaches?: No Dizziness?: No  Psychologic Depression?: No Anxiety?: No  Physical Exam: BP 128/79   Pulse 77   Ht 5\' 7"  (1.702 m)   Wt 172 lb 1.6 oz (78.1 kg)   BMI 26.95 kg/m   Constitutional:  Alert and oriented, No acute distress.  Accompanied by sister today. HEENT: [place] AT, moist mucus membranes.  Trachea midline, no masses. Cardiovascular: No clubbing, cyanosis, or edema. Respiratory: Normal respiratory effort, no increased work of breathing. GI: Abdomen is soft, nontender, nondistended, no abdominal masses GU: No CVA tenderness.  Skin: No rashes, bruises or suspicious lesions. Neurologic: Grossly intact, no focal deficits, moving all 4 extremities. Psychiatric: Normal mood and affect.  Laboratory Data: Lab Results  Component Value Date   WBC 10.5 02/18/2016   HGB 12.2 09/01/2016   HCT 36.0 09/01/2016   MCV 87.0 02/18/2016   PLT 266.0 02/18/2016    Lab Results  Component Value Date   CREATININE 1.05 09/30/2016   CREATININE 1.05 09/30/2016    Lab Results  Component Value Date     HGBA1C 7.5 (H) 09/30/2016    Urinalysis 0-5 WBC's.  See EPIC.    Pertinent Imaging: Most recent cross-sectional imaging the form of CT abdomen with and without contrast on 11/12/2015, retrograde pyelogram 08/2016   Assessment &  Plan:    1. Malignant neoplasm of urinary bladder neck (HCC) Low-grade recurrence at the bladder neck and left hemitrigone, superficial Pathology reviewed with the patient  Given that she is relatively high risk history of left upper tract urothelial carcinoma, I recommended consideration course of BCG followed by maintenance for 1 year Risks of BCG were reviewed today in detail including risk of UTI, BCG sepsis, irritative voiding symptoms amongst others She understands that BCG may or may not decrease bladder cancer risk of progression She is interested in proceeding with this, will arrange for induction course Cysto q3 months  - Reviewed BCG treatment course, possible side effects including BCG sepsis, bladder irritation, worsening of her urinary symptoms  - # 3 of 6 BCG installed today  - Patient was instructed to pour bleach down her toilet for the next 6 hours  -  Instructed to call the office if she should experience fevers greater than 102, chills/rigors, onset of a new cough, night sweats or further bladder spasms or inability to urinate   - RTC in one week for # 4 BCG  - Surveillance protocol also discussed today including cystoscopy every 3 months for at least 2 years and then spread out thereafter  - Urinalysis, Complete  - bcg vaccine injection 81 mg; Instill 3.24 mLs (81 mg total) into the bladder once.  - lidocaine (XYLOCAINE) 2 % jelly 1 application; Place 1 application into the urethra once.   2. Urothelial carcinoma of kidney, left ([place]) Cysto q 3 months as above Annual upper tract imaging   3. Solitary kidney Solitary kidney precautions reviewed again today   Return in about 1 week (around 10/28/2016) for # 4 BCG.  Zara Council,  [facility] [address] [place], [place] [address] [phone]

## 2016-10-21 ENCOUNTER — Encounter: Payer: Self-pay | Admitting: Urology

## 2016-10-21 ENCOUNTER — Ambulatory Visit (INDEPENDENT_AMBULATORY_CARE_PROVIDER_SITE_OTHER): Payer: Medicare Other | Admitting: Urology

## 2016-10-21 VITALS — BP 128/79 | HR 77 | Ht 67.0 in | Wt 172.1 lb

## 2016-10-21 DIAGNOSIS — C642 Malignant neoplasm of left kidney, except renal pelvis: Secondary | ICD-10-CM | POA: Diagnosis not present

## 2016-10-21 DIAGNOSIS — C679 Malignant neoplasm of bladder, unspecified: Secondary | ICD-10-CM | POA: Diagnosis not present

## 2016-10-21 DIAGNOSIS — Q600 Renal agenesis, unilateral: Secondary | ICD-10-CM | POA: Diagnosis not present

## 2016-10-21 DIAGNOSIS — IMO0002 Reserved for concepts with insufficient information to code with codable children: Secondary | ICD-10-CM

## 2016-10-21 LAB — URINALYSIS, COMPLETE
BILIRUBIN UA: NEGATIVE
Ketones, UA: NEGATIVE
LEUKOCYTES UA: NEGATIVE
Nitrite, UA: NEGATIVE
PH UA: 5 (ref 5.0–7.5)
Specific Gravity, UA: 1.005 (ref 1.005–1.030)
Urobilinogen, Ur: 0.2 mg/dL (ref 0.2–1.0)

## 2016-10-21 LAB — MICROSCOPIC EXAMINATION
BACTERIA UA: NONE SEEN
RBC, UA: NONE SEEN /hpf (ref 0–?)

## 2016-10-21 MED ORDER — LIDOCAINE HCL 2 % EX GEL
1.0000 | Freq: Once | CUTANEOUS | Status: AC
Start: 2016-10-21 — End: 2016-10-21
  Administered 2016-10-21: 1 via URETHRAL

## 2016-10-21 MED ORDER — BCG LIVE 50 MG IS SUSR
3.2400 mL | Freq: Once | INTRAVESICAL | Status: AC
  Administered 2016-10-21: 81 mg via INTRAVESICAL

## 2016-10-21 NOTE — Progress Notes (Signed)
BCG Bladder Instillation  BCG # 3  Due to Bladder Cancer patient is present today for a BCG treatment. Patient was cleaned and prepped in a sterile fashion with betadine and lidocaine 2% jelly was instilled into the urethra.  A 14FR catheter was inserted, urine return was noted 266ml, urine was yellow in color.  95ml of reconstituted BCG was instilled into the bladder. The catheter was then removed. Patient tolerated well, no complications were noted.  Preformed by: Royden Purl and Lyndee Hensen CMA  Follow up/ Additional notes: One week

## 2016-10-27 NOTE — Progress Notes (Signed)
10/28/2016 10:57 AM   [name] July 30, 1960 [id number]  Referring provider: [name], MD [address] [place], [place] [address]  CC: Bladder cancer  Patient is a 56 year old Caucasian female who presents today to start an induction course of 6 weekly treatments of BCG's. This will be #4 out of 6.  Background history HPI:  56 yo F with history of pT1N0 left upper tract urothelial carcinoma s/p nephroU 4/016 with low grade Ta bladder recurrence s/p TURBT, mitomycin, right retrograde pyelogram.   1 - Left Upper Tract Transitional Cell Carcinoma - s/p LEFT robotic nephroureterectomy on 10/31/2014 as well as combined ventral hernia repair with Dr. Bary Castilla. Post complicated by wound infection now well healed. Pathology  pT1N0 with negative margins. Recent Surveillance: 02/2015 - cysto NED; 05/2015 cysto NED, 08/2015- cysto NED, 11/2015- NED, CT Urogram 11/12/15 negative, poor quality of delayed phase.; 5/2-17- cysto with mild erythema following recent UTI; 02/2016 NED; 08/2016 Lg Ta TCC recurrence,  1 cm, multifocal.  RTG neg.  2 - Solitary Kidney  - s/p left nephrectomy as per above. Most recent Cr 1.19 06/2016.  She does have an extensive smoking history, quit 5 years ago but smoked up to 2 packs a day for 35 years. She also has multiple medical co morbidities including history of diabetes, CAD status post CABG, carotid endarterectomy.    Today, she is having no urinary symptoms.  She was able to hold her last instillation for two hours.   She has not had any recent dysuria, gross hematuria or suprapubic pain. She has not had any recent fevers, chills, nausea or vomiting. She has not had a recent cough. Her UA today is unremarkable.    PMH: Past Medical History:  Diagnosis Date  . Anxiety   . Cancer ([place])   . CHF (congestive heart failure) ([place])   . Coronary artery disease   . Diabetes mellitus   . Heart murmur   . Hemorrhoid   . Hypertension   . Neuropathy   .  PVD (peripheral vascular disease) ([place])   . Urothelial carcinoma of kidney ([place]) 10/31/2014   INVASIVE UROTHELIAL CARCINOMA, LOW GRADE. T1, Nx.    Surgical History: Past Surgical History:  Procedure Laterality Date  . AMPUTATION TOE    . ARTERIAL BYPASS SURGRY  2009, 2013 x 2   right leg , done in [place]  . CARDIAC CATHETERIZATION    . CAROTID ENDARTERECTOMY Right 01/2014   Dr Delana Meyer  . CATARACT EXTRACTION W/PHACO Right 12/14/2014   Procedure: CATARACT EXTRACTION PHACO AND INTRAOCULAR LENS PLACEMENT (IOC);  Surgeon: Lyla Glassing, MD;  Location: [facility];  Service: Ophthalmology;  Laterality: Right;  [place]   00:38.6              AP        7.1                   CDE  2.76  . CESAREAN SECTION    . CHOLECYSTECTOMY  03-03-12   Porcelain gallbladder, gallstones,  [place]  . COLONOSCOPY W/ BIOPSIES  04/28/2012   Hyperplastic rectal polyps.  . CORONARY ARTERY BYPASS GRAFT  2009   3 vessel  . CYSTOSCOPY W/ RETROGRADES Right 09/01/2016   Procedure: CYSTOSCOPY WITH RETROGRADE PYELOGRAM;  Surgeon: Hollice Espy, MD;  Location: [facility];  Service: Urology;  Laterality: Right;  . HERNIA REPAIR  10-31-14   ventral, retro-rectus atrium mesh  . NEPHRECTOMY Left 10-31-14  . PERIPHERAL VASCULAR CATHETERIZATION  Left 05/01/2015   Procedure: Lower Extremity Angiography;  Surgeon: Katha Cabal, MD;  Location: [facility];  Service: Cardiovascular;  Laterality: Left;  . PERIPHERAL VASCULAR CATHETERIZATION  05/01/2015   Procedure: Lower Extremity Intervention;  Surgeon: Katha Cabal, MD;  Location: [facility];  Service: Cardiovascular;;  . PERIPHERAL VASCULAR CATHETERIZATION Left 02/20/2015   Procedure: Pelvic Angiography;  Surgeon: Katha Cabal, MD;  Location: [facility];  Service: Cardiovascular;  Laterality: Left;  . TRANSURETHRAL RESECTION OF BLADDER TUMOR WITH MITOMYCIN-C N/A 09/01/2016   Procedure: TRANSURETHRAL RESECTION OF BLADDER TUMOR WITH MITOMYCIN-C;   Surgeon: Hollice Espy, MD;  Location: [facility];  Service: Urology;  Laterality: N/A;    Home Medications:  Allergies as of 10/28/2016   No Known Allergies     Medication List       Accurate as of 10/28/16 10:57 AM. Always use your most recent med list.          ACCU-CHEK AVIVA PLUS test strip Generic drug:  glucose blood CHECK BLOOD SUGAR TWO TO THREE TIMES A DAY   acetaminophen 500 MG tablet Commonly known as:  TYLENOL Take 1,000 mg by mouth every 6 (six) hours as needed (for pain/headac.).   AMITIZA 24 MCG capsule Generic drug:  lubiprostone TAKE ONE CAPSULE BY MOUTH TWICE DAILY WITH A MEAL   amLODipine 5 MG tablet Commonly known as:  NORVASC Take 1 tablet (5 mg total) by mouth daily.   aspirin 81 MG chewable tablet Chew 81 mg by mouth daily.   atorvastatin 20 MG tablet Commonly known as:  LIPITOR Take 20 mg by mouth daily after supper.   atorvastatin 20 MG tablet Commonly known as:  LIPITOR TAKE 1 TABLET BY MOUTH EVERY DAY   B-D ULTRAFINE III SHORT PEN 31G X 8 MM Misc Generic drug:  Insulin Pen Needle TEST BLOOD SUGAR TWICE DAILY   B-D ULTRAFINE III SHORT PEN 31G X 8 MM Misc Generic drug:  Insulin Pen Needle TEST BLOOD SUGAR TWICE DAILY   benzonatate 100 MG capsule Commonly known as:  TESSALON Take 1 capsule (100 mg total) by mouth 2 (two) times daily as needed for cough.   clopidogrel 75 MG tablet Commonly known as:  PLAVIX TAKE 1 TABLET BY MOUTH EVERY DAY   DIGOX 0.125 MG tablet Generic drug:  digoxin TAKE 1 TABLET BY MOUTH EVERY DAY   gabapentin 600 MG tablet Commonly known as:  NEURONTIN Take 600 mg by mouth 3 (three) times daily.   HUMALOG MIX 75/25 KWIKPEN (75-25) 100 UNIT/ML Kwikpen Generic drug:  Insulin Lispro Prot & Lispro INJECT 30 UNITS UNDER THE SKIN EVERY MORNING AND 36 UNITS EVERY EVENING   HUMALOG MIX 75/25 KWIKPEN (75-25) 100 UNIT/ML Kwikpen Generic drug:  Insulin Lispro Prot & Lispro INJECT 30 UNITS UNDER THE SKIN EVERY  MORNING AND 36 UNITS EVERY EVENING   HYDROcodone-acetaminophen 5-325 MG tablet Commonly known as:  NORCO/VICODIN Take 1-2 tablets by mouth every 6 (six) hours as needed for moderate pain.   Lactulose 20 GM/30ML Soln 30 ml every 4 hours until constipation is relieved   LEVEMIR FLEXTOUCH 100 UNIT/ML Pen Generic drug:  Insulin Detemir INJECT 40 UNITS UNDER THE SKIN EVERY DAY AT 10 PM   LORazepam 0.5 MG tablet Commonly known as:  ATIVAN TAKE 1 TABLET BY MOUTH AT BEDTIME, MAY REPEAT IN 1 HOUR IF NEEDED   mometasone 0.1 % lotion Commonly known as:  ELOCON Apply 1-2 application topically daily as needed (  into ear canal for ear irriation).   nystatin-triamcinolone ointment Commonly known as:  MYCOLOG Apply 1 application topically 2 (two) times daily. To left ear   traMADol 50 MG tablet Commonly known as:  ULTRAM Take 1 tablet (50 mg total) by mouth every 8 (eight) hours as needed.   TRUEPLUS LANCETS 28G Misc CHECK BLOOD SUGAR FOUR TIMES DAILY   Vitamin D3 2000 units Tabs Take 2,000 Units by mouth daily after supper.       Allergies: No Known Allergies  Family History: Family History  Problem Relation Age of Onset  . Cancer Mother 5    Lung Cancer  . Cancer Father 66    Lung Ca  . Diabetes Son   . Breast cancer Maternal Grandmother   . Kidney cancer Neg Hx   . Bladder Cancer Neg Hx   . Prostate cancer Neg Hx     Social History:  reports that she quit smoking about 5 years ago. Her smoking use included Cigarettes. She has a 70.00 pack-year smoking history. She has never used smokeless tobacco. She reports that she does not drink alcohol or use drugs.  ROS: UROLOGY Frequent Urination?: No Hard to postpone urination?: No Burning/pain with urination?: No Get up at night to urinate?: No Leakage of urine?: No Urine stream starts and stops?: No Trouble starting stream?: No Do you have to strain to urinate?: No Blood in urine?: No Urinary tract infection?:  No Sexually transmitted disease?: No Injury to kidneys or bladder?: No Painful intercourse?: No Weak stream?: No Currently pregnant?: No Vaginal bleeding?: No Last menstrual period?: n/a  Gastrointestinal Nausea?: No Vomiting?: No Indigestion/heartburn?: No Diarrhea?: No Constipation?: No  Constitutional Fever: No Night sweats?: No Weight loss?: No Fatigue?: No  Skin Skin rash/lesions?: No Itching?: No  Eyes Blurred vision?: No Double vision?: No  Ears/Nose/Throat Sore throat?: No Sinus problems?: No  Hematologic/Lymphatic Swollen glands?: No Easy bruising?: No  Cardiovascular Leg swelling?: No Chest pain?: No  Respiratory Cough?: No Shortness of breath?: No  Endocrine Excessive thirst?: No  Musculoskeletal Back pain?: No Joint pain?: No  Neurological Headaches?: No Dizziness?: No  Psychologic Depression?: No Anxiety?: No  Physical Exam: BP 122/67   Pulse 76   Ht 5\' 7"  (1.702 m)   Wt 169 lb (76.7 kg)   BMI 26.47 kg/m   Constitutional:  Alert and oriented, No acute distress.  Accompanied by sister today. HEENT: [place] AT, moist mucus membranes.  Trachea midline, no masses. Cardiovascular: No clubbing, cyanosis, or edema. Respiratory: Normal respiratory effort, no increased work of breathing. GI: Abdomen is soft, nontender, nondistended, no abdominal masses GU: No CVA tenderness.  Skin: No rashes, bruises or suspicious lesions. Neurologic: Grossly intact, no focal deficits, moving all 4 extremities. Psychiatric: Normal mood and affect.  Laboratory Data: Lab Results  Component Value Date   WBC 10.5 02/18/2016   HGB 12.2 09/01/2016   HCT 36.0 09/01/2016   MCV 87.0 02/18/2016   PLT 266.0 02/18/2016    Lab Results  Component Value Date   CREATININE 1.05 09/30/2016   CREATININE 1.05 09/30/2016    Lab Results  Component Value Date   HGBA1C 7.5 (H) 09/30/2016    Urinalysis Unremarkable.  See EPIC.    Pertinent Imaging: Most  recent cross-sectional imaging the form of CT abdomen with and without contrast on 11/12/2015, retrograde pyelogram 08/2016   Assessment & Plan:    1. Malignant neoplasm of urinary bladder neck (HCC) Low-grade recurrence at the bladder neck and left  hemitrigone, superficial Pathology reviewed with the patient  Given that she is relatively high risk history of left upper tract urothelial carcinoma, I recommended consideration course of BCG followed by maintenance for 1 year Risks of BCG were reviewed today in detail including risk of UTI, BCG sepsis, irritative voiding symptoms amongst others She understands that BCG may or may not decrease bladder cancer risk of progression She is interested in proceeding with this, will arrange for induction course Cysto q3 months  - Reviewed BCG treatment course, possible side effects including BCG sepsis, bladder irritation, worsening of her urinary symptoms  - # 4 of 6 BCG installed today  - Patient was instructed to pour bleach down her toilet for the next 6 hours  -  Instructed to call the office if she should experience fevers greater than 102, chills/rigors, onset of a new cough, night sweats or further bladder spasms or inability to urinate   - RTC in one week for # 5 BCG  - Surveillance protocol also discussed today including cystoscopy every 3 months for at least 2 years and then spread out thereafter  - Urinalysis, Complete  - bcg vaccine injection 81 mg; Instill 3.24 mLs (81 mg total) into the bladder once.  - lidocaine (XYLOCAINE) 2 % jelly 1 application; Place 1 application into the urethra once.   2. Urothelial carcinoma of kidney, left ([place]) Cysto q 3 months as above Annual upper tract imaging   3. Solitary kidney Solitary kidney precautions reviewed again today   Return in about 1 week (around 11/04/2016) for # 5 BCG.  Zara Council, [facility] [address], [place], [place]  [address] [phone]

## 2016-10-28 ENCOUNTER — Ambulatory Visit (INDEPENDENT_AMBULATORY_CARE_PROVIDER_SITE_OTHER): Payer: Medicare Other | Admitting: Urology

## 2016-10-28 ENCOUNTER — Encounter: Payer: Self-pay | Admitting: Urology

## 2016-10-28 VITALS — BP 122/67 | HR 76 | Ht 67.0 in | Wt 169.0 lb

## 2016-10-28 DIAGNOSIS — IMO0002 Reserved for concepts with insufficient information to code with codable children: Secondary | ICD-10-CM

## 2016-10-28 DIAGNOSIS — Q600 Renal agenesis, unilateral: Secondary | ICD-10-CM | POA: Diagnosis not present

## 2016-10-28 DIAGNOSIS — C679 Malignant neoplasm of bladder, unspecified: Secondary | ICD-10-CM | POA: Diagnosis not present

## 2016-10-28 DIAGNOSIS — C642 Malignant neoplasm of left kidney, except renal pelvis: Secondary | ICD-10-CM

## 2016-10-28 LAB — URINALYSIS, COMPLETE
Bilirubin, UA: NEGATIVE
Glucose, UA: NEGATIVE
KETONES UA: NEGATIVE
Leukocytes, UA: NEGATIVE
NITRITE UA: NEGATIVE
Protein, UA: NEGATIVE
Urobilinogen, Ur: 0.2 mg/dL (ref 0.2–1.0)
pH, UA: 5 (ref 5.0–7.5)

## 2016-10-28 LAB — MICROSCOPIC EXAMINATION

## 2016-10-28 MED ORDER — BCG LIVE 50 MG IS SUSR
3.2400 mL | Freq: Once | INTRAVESICAL | Status: AC
  Administered 2016-10-28: 81 mg via INTRAVESICAL

## 2016-10-28 MED ORDER — LIDOCAINE HCL 2 % EX GEL
1.0000 "application " | Freq: Once | CUTANEOUS | Status: AC
  Administered 2016-10-28: 1 via URETHRAL

## 2016-10-28 NOTE — Progress Notes (Signed)
BCG Bladder Instillation  BCG # 4 Due to Bladder Cancer patient is present today for a BCG treatment. Patient was cleaned and prepped in a sterile fashion with betadine and lidocaine 2% jelly was instilled into the urethra.  A 14FR catheter was inserted, urine return was noted 235ml, urine was yellow in color.  28ml of reconstituted BCG was instilled into the bladder. The catheter was then removed. Patient tolerated well, no complications were noted.  Preformed by: Zara Council PA-C and Lyndee Hensen CMA  Follow up/ Additional notes: One week

## 2016-10-28 NOTE — Addendum Note (Signed)
Addended by: Orlene Erm on: 10/28/2016 11:10 AM   Modules accepted: Orders

## 2016-11-03 NOTE — Progress Notes (Signed)
11/04/2016 10:39 AM   Laura Reed [birth year] [id number]  Referring provider: [name], MD [address] [place], [place] [address]  CC: Bladder cancer  Patient is a 56 year old Caucasian female who presents today to start an induction course of 6 weekly treatments of BCG's. This will be #5 out of 6.  Background history HPI:  56 yo F with history of pT1N0 left upper tract urothelial carcinoma s/p nephroU 4/016 with low grade Ta bladder recurrence s/p TURBT, mitomycin, right retrograde pyelogram.   1 - Left Upper Tract Transitional Cell Carcinoma - s/p LEFT robotic nephroureterectomy on 10/31/2014 as well as combined ventral hernia repair with Dr. Bary Castilla. Post complicated by wound infection now well healed. Pathology  pT1N0 with negative margins. Recent Surveillance: 02/2015 - cysto NED; 05/2015 cysto NED, 08/2015- cysto NED, 11/2015- NED, CT Urogram 11/12/15 negative, poor quality of delayed phase.; 5/2-17- cysto with mild erythema following recent UTI; 02/2016 NED; 08/2016 Lg Ta TCC recurrence,  1 cm, multifocal.  RTG neg.  2 - Solitary Kidney  - s/p left nephrectomy as per above. Most recent Cr 1.19 06/2016.  She does have an extensive smoking history, quit 5 years ago but smoked up to 2 packs a day for 35 years. She also has multiple medical co morbidities including history of diabetes, CAD status post CABG, carotid endarterectomy.    Today, she is having no urinary symptoms.  She was able to hold her last instillation for two hours.   She has not had any recent dysuria, gross hematuria or suprapubic pain. She has not had any recent fevers, chills, nausea or vomiting. She has not had a recent cough. Her UA today is unremarkable.    PMH: Past Medical History:  Diagnosis Date  . Anxiety   . Cancer ([place])   . CHF (congestive heart failure) ([place])   . Coronary artery disease   . Diabetes mellitus   . Heart murmur   . Hemorrhoid   . Hypertension   . Neuropathy   .  PVD (peripheral vascular disease) ([place])   . Urothelial carcinoma of kidney ([place]) 10/31/2014   INVASIVE UROTHELIAL CARCINOMA, LOW GRADE. T1, Nx.    Surgical History: Past Surgical History:  Procedure Laterality Date  . AMPUTATION TOE    . ARTERIAL BYPASS SURGRY  2009, 2013 x 2   right leg , done in [place]  . CARDIAC CATHETERIZATION    . CAROTID ENDARTERECTOMY Right 01/2014   Dr Delana Meyer  . CATARACT EXTRACTION W/PHACO Right 12/14/2014   Procedure: CATARACT EXTRACTION PHACO AND INTRAOCULAR LENS PLACEMENT (IOC);  Surgeon: Lyla Glassing, MD;  Location: [facility];  Service: Ophthalmology;  Laterality: Right;  [place]   00:38.6              AP        7.1                   CDE  2.76  . CESAREAN SECTION    . CHOLECYSTECTOMY  03-03-12   Porcelain gallbladder, gallstones,  [place]  . COLONOSCOPY W/ BIOPSIES  04/28/2012   Hyperplastic rectal polyps.  . CORONARY ARTERY BYPASS GRAFT  2009   3 vessel  . CYSTOSCOPY W/ RETROGRADES Right 09/01/2016   Procedure: CYSTOSCOPY WITH RETROGRADE PYELOGRAM;  Surgeon: [place] Espy, MD;  Location: [facility];  Service: Urology;  Laterality: Right;  . HERNIA REPAIR  10-31-14   ventral, retro-rectus atrium mesh  . NEPHRECTOMY Left 10-31-14  . PERIPHERAL VASCULAR CATHETERIZATION  Left 05/01/2015   Procedure: Lower Extremity Angiography;  Surgeon: Katha Cabal, MD;  Location: [facility];  Service: Cardiovascular;  Laterality: Left;  . PERIPHERAL VASCULAR CATHETERIZATION  05/01/2015   Procedure: Lower Extremity Intervention;  Surgeon: Katha Cabal, MD;  Location: [facility];  Service: Cardiovascular;;  . PERIPHERAL VASCULAR CATHETERIZATION Left 02/20/2015   Procedure: Pelvic Angiography;  Surgeon: Katha Cabal, MD;  Location: [facility];  Service: Cardiovascular;  Laterality: Left;  . TRANSURETHRAL RESECTION OF BLADDER TUMOR WITH MITOMYCIN-C N/A 09/01/2016   Procedure: TRANSURETHRAL RESECTION OF BLADDER TUMOR WITH MITOMYCIN-C;   Surgeon: [place] Espy, MD;  Location: [facility];  Service: Urology;  Laterality: N/A;    Home Medications:  Allergies as of 11/04/2016   No Known Allergies     Medication List       Accurate as of 11/04/16 10:39 AM. Always use your most recent med list.          ACCU-CHEK AVIVA PLUS test strip Generic drug:  glucose blood CHECK BLOOD SUGAR TWO TO THREE TIMES A DAY   acetaminophen 500 MG tablet Commonly known as:  TYLENOL Take 1,000 mg by mouth every 6 (six) hours as needed (for pain/headac.).   AMITIZA 24 MCG capsule Generic drug:  lubiprostone TAKE ONE CAPSULE BY MOUTH TWICE DAILY WITH A MEAL   amLODipine 5 MG tablet Commonly known as:  NORVASC Take 1 tablet (5 mg total) by mouth daily.   aspirin 81 MG chewable tablet Chew 81 mg by mouth daily.   atorvastatin 20 MG tablet Commonly known as:  LIPITOR Take 20 mg by mouth daily after supper.   atorvastatin 20 MG tablet Commonly known as:  LIPITOR TAKE 1 TABLET BY MOUTH EVERY DAY   B-D ULTRAFINE III SHORT PEN 31G X 8 MM Misc Generic drug:  Insulin Pen Needle TEST BLOOD SUGAR TWICE DAILY   B-D ULTRAFINE III SHORT PEN 31G X 8 MM Misc Generic drug:  Insulin Pen Needle TEST BLOOD SUGAR TWICE DAILY   benzonatate 100 MG capsule Commonly known as:  TESSALON Take 1 capsule (100 mg total) by mouth 2 (two) times daily as needed for cough.   clopidogrel 75 MG tablet Commonly known as:  PLAVIX TAKE 1 TABLET BY MOUTH EVERY DAY   DIGOX 0.125 MG tablet Generic drug:  digoxin TAKE 1 TABLET BY MOUTH EVERY DAY   gabapentin 600 MG tablet Commonly known as:  NEURONTIN Take 600 mg by mouth 3 (three) times daily.   HUMALOG MIX 75/25 KWIKPEN (75-25) 100 UNIT/ML Kwikpen Generic drug:  Insulin Lispro Prot & Lispro INJECT 30 UNITS UNDER THE SKIN EVERY MORNING AND 36 UNITS EVERY EVENING   HUMALOG MIX 75/25 KWIKPEN (75-25) 100 UNIT/ML Kwikpen Generic drug:  Insulin Lispro Prot & Lispro INJECT 30 UNITS UNDER THE SKIN EVERY  MORNING AND 36 UNITS EVERY EVENING   HYDROcodone-acetaminophen 5-325 MG tablet Commonly known as:  NORCO/VICODIN Take 1-2 tablets by mouth every 6 (six) hours as needed for moderate pain.   Lactulose 20 GM/30ML Soln 30 ml every 4 hours until constipation is relieved   LEVEMIR FLEXTOUCH 100 UNIT/ML Pen Generic drug:  Insulin Detemir INJECT 40 UNITS UNDER THE SKIN EVERY DAY AT 10 PM   LORazepam 0.5 MG tablet Commonly known as:  ATIVAN TAKE 1 TABLET BY MOUTH AT BEDTIME, MAY REPEAT IN 1 HOUR IF NEEDED   mometasone 0.1 % lotion Commonly known as:  ELOCON Apply 1-2 application topically daily as needed (  into ear canal for ear irriation).   nystatin-triamcinolone ointment Commonly known as:  MYCOLOG Apply 1 application topically 2 (two) times daily. To left ear   traMADol 50 MG tablet Commonly known as:  ULTRAM Take 1 tablet (50 mg total) by mouth every 8 (eight) hours as needed.   TRUEPLUS LANCETS 28G Misc CHECK BLOOD SUGAR FOUR TIMES DAILY   Vitamin D3 2000 units Tabs Take 2,000 Units by mouth daily after supper.       Allergies: No Known Allergies  Family History: Family History  Problem Relation Age of Onset  . Cancer Mother 46    Lung Cancer  . Cancer Father 45    Lung Ca  . Diabetes Son   . Breast cancer Maternal Grandmother   . Kidney cancer Neg Hx   . Bladder Cancer Neg Hx   . Prostate cancer Neg Hx     Social History:  reports that she quit smoking about 5 years ago. Her smoking use included Cigarettes. She has a 70.00 pack-year smoking history. She has never used smokeless tobacco. She reports that she does not drink alcohol or use drugs.  ROS: UROLOGY Frequent Urination?: No Hard to postpone urination?: No Burning/pain with urination?: No Get up at night to urinate?: No Leakage of urine?: No Urine stream starts and stops?: No Trouble starting stream?: No Do you have to strain to urinate?: No Blood in urine?: No Urinary tract infection?:  No Sexually transmitted disease?: No Injury to kidneys or bladder?: No Painful intercourse?: No Weak stream?: No Currently pregnant?: No Vaginal bleeding?: No Last menstrual period?: n  Gastrointestinal Nausea?: No Vomiting?: No Indigestion/heartburn?: No Diarrhea?: No Constipation?: No  Constitutional Fever: No Night sweats?: No Weight loss?: No Fatigue?: No  Skin Skin rash/lesions?: No Itching?: No  Eyes Blurred vision?: No Double vision?: No  Ears/Nose/Throat Sore throat?: No Sinus problems?: No  Hematologic/Lymphatic Swollen glands?: No Easy bruising?: No  Cardiovascular Leg swelling?: No Chest pain?: No  Respiratory Cough?: No Shortness of breath?: No  Endocrine Excessive thirst?: No  Musculoskeletal Back pain?: No Joint pain?: No  Neurological Headaches?: No Dizziness?: No  Psychologic Depression?: No Anxiety?: No  Physical Exam: BP (!) 159/72   Pulse 81   Ht 5\' 7"  (1.702 m)   Wt 169 lb 6.4 oz (76.8 kg)   BMI 26.53 kg/m   Constitutional:  Alert and oriented, No acute distress.  Accompanied by sister today. HEENT: [place] AT, moist mucus membranes.  Trachea midline, no masses. Cardiovascular: No clubbing, cyanosis, or edema. Respiratory: Normal respiratory effort, no increased work of breathing. GI: Abdomen is soft, nontender, nondistended, no abdominal masses GU: No CVA tenderness.  Skin: No rashes, bruises or suspicious lesions. Neurologic: Grossly intact, no focal deficits, moving all 4 extremities. Psychiatric: Normal mood and affect.  Laboratory Data: Lab Results  Component Value Date   WBC 10.5 02/18/2016   HGB 12.2 09/01/2016   HCT 36.0 09/01/2016   MCV 87.0 02/18/2016   PLT 266.0 02/18/2016    Lab Results  Component Value Date   CREATININE 1.05 09/30/2016   CREATININE 1.05 09/30/2016    Lab Results  Component Value Date   HGBA1C 7.5 (H) 09/30/2016    Urinalysis Unremarkable.  See EPIC.    Pertinent  Imaging: Most recent cross-sectional imaging the form of CT abdomen with and without contrast on 11/12/2015, retrograde pyelogram 08/2016   Assessment & Plan:    1. Malignant neoplasm of urinary bladder neck (HCC) Low-grade recurrence at the bladder  neck and left hemitrigone, superficial Pathology reviewed with the patient  Given that she is relatively high risk history of left upper tract urothelial carcinoma, I recommended consideration course of BCG followed by maintenance for 1 year Risks of BCG were reviewed today in detail including risk of UTI, BCG sepsis, irritative voiding symptoms amongst others She understands that BCG may or may not decrease bladder cancer risk of progression She is interested in proceeding with this, will arrange for induction course Cysto q3 months  - Reviewed BCG treatment course, possible side effects including BCG sepsis, bladder irritation, worsening of her urinary symptoms  - # 5 of 6 BCG installed today  - Patient was instructed to pour bleach down her toilet for the next 6 hours  -  Instructed to call the office if she should experience fevers greater than 102, chills/rigors, onset of a new cough, night sweats or further bladder spasms or inability to urinate   - RTC in one week for # 6 BCG  - Surveillance protocol also discussed today including cystoscopy every 3 months for at least 2 years and then spread out thereafter  - Urinalysis, Complete  - bcg vaccine injection 81 mg; Instill 3.24 mLs (81 mg total) into the bladder once.  - lidocaine (XYLOCAINE) 2 % jelly 1 application; Place 1 application into the urethra once.   2. Urothelial carcinoma of kidney, left ([place]) Cysto q 3 months as above Annual upper tract imaging   3. Solitary kidney Solitary kidney precautions reviewed again today   Return in about 1 week (around 11/11/2016) for # 6 BCG.  Zara Council, [facility] [address] [place], [place] [address] [phone]

## 2016-11-04 ENCOUNTER — Ambulatory Visit (INDEPENDENT_AMBULATORY_CARE_PROVIDER_SITE_OTHER): Payer: Medicare Other | Admitting: Urology

## 2016-11-04 ENCOUNTER — Other Ambulatory Visit: Payer: Self-pay | Admitting: Internal Medicine

## 2016-11-04 ENCOUNTER — Encounter: Payer: Self-pay | Admitting: Urology

## 2016-11-04 VITALS — BP 159/72 | HR 81 | Ht 67.0 in | Wt 169.4 lb

## 2016-11-04 DIAGNOSIS — C642 Malignant neoplasm of left kidney, except renal pelvis: Secondary | ICD-10-CM | POA: Diagnosis not present

## 2016-11-04 DIAGNOSIS — Q600 Renal agenesis, unilateral: Secondary | ICD-10-CM

## 2016-11-04 DIAGNOSIS — IMO0002 Reserved for concepts with insufficient information to code with codable children: Secondary | ICD-10-CM

## 2016-11-04 DIAGNOSIS — C679 Malignant neoplasm of bladder, unspecified: Secondary | ICD-10-CM | POA: Diagnosis not present

## 2016-11-04 LAB — MICROSCOPIC EXAMINATION
Bacteria, UA: NONE SEEN
RBC MICROSCOPIC, UA: NONE SEEN /HPF (ref 0–?)

## 2016-11-04 LAB — URINALYSIS, COMPLETE
Bilirubin, UA: NEGATIVE
Ketones, UA: NEGATIVE
LEUKOCYTES UA: NEGATIVE
NITRITE UA: NEGATIVE
Urobilinogen, Ur: 0.2 mg/dL (ref 0.2–1.0)
pH, UA: 5.5 (ref 5.0–7.5)

## 2016-11-04 MED ORDER — BCG LIVE 50 MG IS SUSR
3.2400 mL | Freq: Once | INTRAVESICAL | Status: AC
  Administered 2016-11-04: 81 mg via INTRAVESICAL

## 2016-11-04 MED ORDER — LIDOCAINE HCL 2 % EX GEL
1.0000 "application " | Freq: Once | CUTANEOUS | Status: AC
  Administered 2016-11-04: 1 via URETHRAL

## 2016-11-04 NOTE — Addendum Note (Signed)
Addended by: Orlene Erm on: 11/04/2016 11:19 AM   Modules accepted: Orders

## 2016-11-04 NOTE — Progress Notes (Signed)
BCG Bladder Instillation  BCG # 5  Due to Bladder Cancer patient is present today for a BCG treatment. Patient was cleaned and prepped in a sterile fashion with betadine and lidocaine 2% jelly was instilled into the urethra.  A 14FR catheter was inserted, urine return was noted 137ml, urine was yellow in color.  70ml of reconstituted BCG was instilled into the bladder. The catheter was then removed. Patient tolerated well, no complications were noted.  Preformed by: Zara Council PA-C and Lyndee Hensen CMA  Follow up/ Additional notes: One week

## 2016-11-06 DIAGNOSIS — L851 Acquired keratosis [keratoderma] palmaris et plantaris: Secondary | ICD-10-CM | POA: Diagnosis not present

## 2016-11-06 DIAGNOSIS — Q6689 Other  specified congenital deformities of feet: Secondary | ICD-10-CM | POA: Diagnosis not present

## 2016-11-06 DIAGNOSIS — M2041 Other hammer toe(s) (acquired), right foot: Secondary | ICD-10-CM | POA: Diagnosis not present

## 2016-11-06 NOTE — Telephone Encounter (Signed)
Scheduled 11/13/16

## 2016-11-07 ENCOUNTER — Other Ambulatory Visit: Payer: Self-pay | Admitting: Internal Medicine

## 2016-11-10 NOTE — Progress Notes (Signed)
11/11/2016 10:35 AM   Laura Reed February 27, 1961 [id number]  Referring provider: [name], MD [address] [place],  [address]  CC: Bladder cancer  Patient is a 56 year old Caucasian female who presents today to start an induction course of 6 weekly treatments of BCG's. This will be #6 out of 6.  Background history HPI:  56 yo F with history of pT1N0 left upper tract urothelial carcinoma s/p nephroU 4/016 with low grade Ta bladder recurrence s/p TURBT, mitomycin, right retrograde pyelogram.   1 - Left Upper Tract Transitional Cell Carcinoma - s/p LEFT robotic nephroureterectomy on 10/31/2014 as well as combined ventral hernia repair with Dr. Bary Castilla. Post complicated by wound infection now well healed. Pathology  pT1N0 with negative margins. Recent Surveillance: 02/2015 - cysto NED; 05/2015 cysto NED, 08/2015- cysto NED, 11/2015- NED, CT Urogram 11/12/15 negative, poor quality of delayed phase.; 5/2-17- cysto with mild erythema following recent UTI; 02/2016 NED; 08/2016 Lg Ta TCC recurrence,  1 cm, multifocal.  RTG neg.  2 - Solitary Kidney  - s/p left nephrectomy as per above. Most recent Cr 1.19 06/2016.  She does have an extensive smoking history, quit 5 years ago but smoked up to 2 packs a day for 35 years. She also has multiple medical co morbidities including history of diabetes, CAD status post CABG, carotid endarterectomy.    Today, she is having no urinary symptoms.  She was able to hold her last instillation for two hours.   She has not had any recent dysuria, gross hematuria or suprapubic pain. She has not had any recent fevers, chills, nausea or vomiting. She has not had a recent cough. Her UA today is unremarkable.    PMH: Past Medical History:  Diagnosis Date  . Anxiety   . Cancer ([employer])   . CHF (congestive heart failure) ([place])   . Coronary artery disease   . Diabetes mellitus   . Heart murmur   . Hemorrhoid   . Hypertension   . Neuropathy   .  PVD (peripheral vascular disease) ([place])   . Urothelial carcinoma of kidney ([place]) 10/31/2014   INVASIVE UROTHELIAL CARCINOMA, LOW GRADE. T1, Nx.    Surgical History: Past Surgical History:  Procedure Laterality Date  . AMPUTATION TOE    . ARTERIAL BYPASS SURGRY  2009, 2013 x 2   right leg , done in [place]  . CARDIAC CATHETERIZATION    . CAROTID ENDARTERECTOMY Right 01/2014   Dr Delana Meyer  . CATARACT EXTRACTION W/PHACO Right 12/14/2014   Procedure: CATARACT EXTRACTION PHACO AND INTRAOCULAR LENS PLACEMENT (IOC);  Surgeon: Lyla Glassing, MD;  Location: [facility];  Service: Ophthalmology;  Laterality: Right;  [place]   00:38.6              AP        7.1                   CDE  2.76  . CESAREAN SECTION    . CHOLECYSTECTOMY  03-03-12   Porcelain gallbladder, gallstones,  [place]  . COLONOSCOPY W/ BIOPSIES  04/28/2012   Hyperplastic rectal polyps.  . CORONARY ARTERY BYPASS GRAFT  2009   3 vessel  . CYSTOSCOPY W/ RETROGRADES Right 09/01/2016   Procedure: CYSTOSCOPY WITH RETROGRADE PYELOGRAM;  Surgeon: Hollice Espy, MD;  Location: [facility];  Service: Urology;  Laterality: Right;  . HERNIA REPAIR  10-31-14   ventral, retro-rectus atrium mesh  . NEPHRECTOMY Left 10-31-14  . PERIPHERAL VASCULAR CATHETERIZATION  Left 05/01/2015   Procedure: Lower Extremity Angiography;  Surgeon: Katha Cabal, MD;  Location: [place] [facility];  Service: Cardiovascular;  Laterality: Left;  . PERIPHERAL VASCULAR CATHETERIZATION  05/01/2015   Procedure: Lower Extremity Intervention;  Surgeon: Katha Cabal, MD;  Location: [place] [facility];  Service: Cardiovascular;;  . PERIPHERAL VASCULAR CATHETERIZATION Left 02/20/2015   Procedure: Pelvic Angiography;  Surgeon: Katha Cabal, MD;  Location: [place] [facility];  Service: Cardiovascular;  Laterality: Left;  . TRANSURETHRAL RESECTION OF BLADDER TUMOR WITH MITOMYCIN-C N/A 09/01/2016   Procedure: TRANSURETHRAL RESECTION OF BLADDER TUMOR WITH MITOMYCIN-C;   Surgeon: Hollice Espy, MD;  Location: [facility];  Service: Urology;  Laterality: N/A;    Home Medications:  Allergies as of 11/11/2016   No Known Allergies     Medication List       Accurate as of 11/11/16 10:35 AM. Always use your most recent med list.          ACCU-CHEK AVIVA PLUS test strip Generic drug:  glucose blood CHECK BLOOD SUGAR TWO TO THREE TIMES DAILY   acetaminophen 500 MG tablet Commonly known as:  TYLENOL Take 1,000 mg by mouth every 6 (six) hours as needed (for pain/headac.).   AMITIZA 24 MCG capsule Generic drug:  lubiprostone TAKE ONE CAPSULE BY MOUTH TWICE DAILY WITH A MEAL   amLODipine 5 MG tablet Commonly known as:  NORVASC Take 1 tablet (5 mg total) by mouth daily.   aspirin 81 MG chewable tablet Chew 81 mg by mouth daily.   atorvastatin 20 MG tablet Commonly known as:  LIPITOR Take 20 mg by mouth daily after supper.   atorvastatin 20 MG tablet Commonly known as:  LIPITOR TAKE 1 TABLET BY MOUTH EVERY DAY   B-D ULTRAFINE III SHORT PEN 31G X 8 MM Misc Generic drug:  Insulin Pen Needle TEST BLOOD SUGAR TWICE DAILY   B-D ULTRAFINE III SHORT PEN 31G X 8 MM Misc Generic drug:  Insulin Pen Needle TEST BLOOD SUGAR TWICE DAILY   benzonatate 100 MG capsule Commonly known as:  TESSALON Take 1 capsule (100 mg total) by mouth 2 (two) times daily as needed for cough.   clopidogrel 75 MG tablet Commonly known as:  PLAVIX TAKE 1 TABLET BY MOUTH EVERY DAY   DIGOX 0.125 MG tablet Generic drug:  digoxin TAKE 1 TABLET BY MOUTH EVERY DAY   gabapentin 600 MG tablet Commonly known as:  NEURONTIN Take 600 mg by mouth 3 (three) times daily.   HUMALOG MIX 75/25 KWIKPEN (75-25) 100 UNIT/ML Kwikpen Generic drug:  Insulin Lispro Prot & Lispro INJECT 30 UNITS UNDER THE SKIN EVERY MORNING AND 36 UNITS EVERY EVENING   HUMALOG MIX 75/25 KWIKPEN (75-25) 100 UNIT/ML Kwikpen Generic drug:  Insulin Lispro Prot & Lispro INJECT 30 UNITS UNDER THE SKIN EVERY  MORNING AND 36 UNITS EVERY EVENING   HYDROcodone-acetaminophen 5-325 MG tablet Commonly known as:  NORCO/VICODIN Take 1-2 tablets by mouth every 6 (six) hours as needed for moderate pain.   Lactulose 20 GM/30ML Soln 30 ml every 4 hours until constipation is relieved   LEVEMIR FLEXTOUCH 100 UNIT/ML Pen Generic drug:  Insulin Detemir INJECT 40 UNITS UNDER THE SKIN EVERY DAY AT 10 PM   LORazepam 0.5 MG tablet Commonly known as:  ATIVAN TAKE 1 TABLET BY MOUTH AT BEDTIME, MAY REPEAT IN 1 HOUR IF NEEDED   mometasone 0.1 % lotion Commonly known as:  ELOCON Apply 1-2 application topically daily as needed (into  ear canal for ear irriation).   nystatin-triamcinolone ointment Commonly known as:  MYCOLOG Apply 1 application topically 2 (two) times daily. To left ear   traMADol 50 MG tablet Commonly known as:  ULTRAM Take 1 tablet (50 mg total) by mouth every 8 (eight) hours as needed.   TRUEPLUS LANCETS 28G Misc CHECK BLOOD SUGAR FOUR TIMES DAILY   Vitamin D3 2000 units Tabs Take 2,000 Units by mouth daily after supper.       Allergies: No Known Allergies  Family History: Family History  Problem Relation Age of Onset  . Cancer Mother 13    Lung Cancer  . Cancer Father 33    Lung Ca  . Diabetes Son   . Breast cancer Maternal Grandmother   . Kidney cancer Neg Hx   . Bladder Cancer Neg Hx   . Prostate cancer Neg Hx     Social History:  reports that she quit smoking about 5 years ago. Her smoking use included Cigarettes. She has a 70.00 pack-year smoking history. She has never used smokeless tobacco. She reports that she does not drink alcohol or use drugs.  ROS: UROLOGY Frequent Urination?: No Hard to postpone urination?: No Burning/pain with urination?: No Get up at night to urinate?: No Leakage of urine?: No Urine stream starts and stops?: No Trouble starting stream?: No Do you have to strain to urinate?: No Blood in urine?: No Urinary tract infection?:  No Sexually transmitted disease?: No Injury to kidneys or bladder?: No Painful intercourse?: No Weak stream?: No Currently pregnant?: No Vaginal bleeding?: No Last menstrual period?: n  Gastrointestinal Nausea?: No Vomiting?: No Indigestion/heartburn?: No Diarrhea?: No Constipation?: No  Constitutional Fever: No Night sweats?: No Weight loss?: No Fatigue?: No  Skin Skin rash/lesions?: No Itching?: No  Eyes Blurred vision?: No Double vision?: No  Ears/Nose/Throat Sore throat?: No Sinus problems?: No  Hematologic/Lymphatic Swollen glands?: No Easy bruising?: No  Cardiovascular Leg swelling?: No Chest pain?: No  Respiratory Cough?: No Shortness of breath?: No  Endocrine Excessive thirst?: No  Musculoskeletal Back pain?: No Joint pain?: No  Neurological Headaches?: No Dizziness?: No  Psychologic Depression?: No Anxiety?: No  Physical Exam: BP (!) 149/74   Pulse 74   Ht 5\' 4"  (1.626 m)   Wt 167 lb 9.6 oz (76 kg)   BMI 28.77 kg/m   Constitutional:  Alert and oriented, No acute distress.  Accompanied by sister today. HEENT: [place] AT, moist mucus membranes.  Trachea midline, no masses. Cardiovascular: No clubbing, cyanosis, or edema. Respiratory: Normal respiratory effort, no increased work of breathing. GI: Abdomen is soft, nontender, nondistended, no abdominal masses GU: No CVA tenderness.  Skin: No rashes, bruises or suspicious lesions. Neurologic: Grossly intact, no focal deficits, moving all 4 extremities. Psychiatric: Normal mood and affect.  Laboratory Data: Lab Results  Component Value Date   WBC 10.5 02/18/2016   HGB 12.2 09/01/2016   HCT 36.0 09/01/2016   MCV 87.0 02/18/2016   PLT 266.0 02/18/2016    Lab Results  Component Value Date   CREATININE 1.05 09/30/2016   CREATININE 1.05 09/30/2016    Lab Results  Component Value Date   HGBA1C 7.5 (H) 09/30/2016    Urinalysis Unremarkable.  See EPIC.    Pertinent  Imaging: Most recent cross-sectional imaging the form of CT abdomen with and without contrast on 11/12/2015, retrograde pyelogram 08/2016   Assessment & Plan:    1. Malignant neoplasm of urinary bladder neck (HCC) Low-grade recurrence at the bladder neck  and left hemitrigone, superficial Pathology reviewed with the patient  Given that she is relatively high risk history of left upper tract urothelial carcinoma, I recommended consideration course of BCG followed by maintenance for 1 year Risks of BCG were reviewed today in detail including risk of UTI, BCG sepsis, irritative voiding symptoms amongst others She understands that BCG may or may not decrease bladder cancer risk of progression She is interested in proceeding with this, will arrange for induction course Cysto q3 months  - Reviewed BCG treatment course, possible side effects including BCG sepsis, bladder irritation, worsening of her urinary symptoms  - # 6 of 6 BCG installed today  - Patient was instructed to pour bleach down her toilet for the next 6 hours  -  Instructed to call the office if she should experience fevers greater than 102, chills/rigors, onset of a new cough, night sweats or further bladder spasms or inability to urinate   - RTC on 02/11/2017 for surveillance cystoscopy  - Surveillance protocol also discussed today including cystoscopy every 3 months for at least 2 years and then spread out thereafter  - Urinalysis, Complete  - bcg vaccine injection 81 mg; Instill 3.24 mLs (81 mg total) into the bladder once.  - lidocaine (XYLOCAINE) 2 % jelly 1 application; Place 1 application into the urethra once.   2. Urothelial carcinoma of kidney, left ([place]) Cysto q 3 months as above Annual upper tract imaging   3. Solitary kidney Solitary kidney precautions reviewed again today   Return for RTC on 02/11/2017 for cystoscopy.  Zara Council, [facility] [address], [place], [place] [address] [phone]

## 2016-11-11 ENCOUNTER — Encounter: Payer: Self-pay | Admitting: Urology

## 2016-11-11 ENCOUNTER — Ambulatory Visit (INDEPENDENT_AMBULATORY_CARE_PROVIDER_SITE_OTHER): Payer: Medicare Other | Admitting: Urology

## 2016-11-11 VITALS — BP 149/74 | HR 74 | Ht 64.0 in | Wt 167.6 lb

## 2016-11-11 DIAGNOSIS — C679 Malignant neoplasm of bladder, unspecified: Secondary | ICD-10-CM

## 2016-11-11 MED ORDER — BCG LIVE 50 MG IS SUSR
3.2400 mL | Freq: Once | INTRAVESICAL | Status: AC
  Administered 2016-11-11: 81 mg via INTRAVESICAL

## 2016-11-11 MED ORDER — LIDOCAINE HCL 2 % EX GEL
1.0000 "application " | Freq: Once | CUTANEOUS | Status: AC
  Administered 2016-11-11: 1 via URETHRAL

## 2016-11-11 NOTE — Progress Notes (Signed)
BCG Bladder Instillation  BCG # 6  Due to Bladder Cancer patient is present today for a BCG treatment. Patient was cleaned and prepped in a sterile fashion with betadine and lidocaine 2% jelly was instilled into the urethra.  A 14FR catheter was inserted, urine return was noted 158ml, urine was yellow in color.  32ml of reconstituted BCG was instilled into the bladder. The catheter was then removed. Patient tolerated well, no complications were noted.  Preformed by: Zara Council PA-C and Lyndee Hensen CMA  Follow up/ Additional notes: Cystoscopy in August

## 2016-11-12 LAB — URINALYSIS, COMPLETE
Bilirubin, UA: NEGATIVE
GLUCOSE, UA: NEGATIVE
Ketones, UA: NEGATIVE
Leukocytes, UA: NEGATIVE
Nitrite, UA: NEGATIVE
PROTEIN UA: NEGATIVE
Specific Gravity, UA: <1.005 — ABNORMAL LOW (ref 1.005–1.030)
Urobilinogen, Ur: 0.2 mg/dL (ref 0.2–1.0)
pH, UA: 5 (ref 5.0–7.5)

## 2016-11-12 LAB — MICROSCOPIC EXAMINATION: Bacteria, UA: NONE SEEN

## 2016-11-13 ENCOUNTER — Ambulatory Visit: Payer: Medicare Other

## 2016-11-24 ENCOUNTER — Other Ambulatory Visit: Payer: Self-pay | Admitting: Internal Medicine

## 2016-12-03 NOTE — Progress Notes (Signed)
MRN : [id number]  Laura Reed is a 56 y.o. (Feb 24, 1961) female who presents with chief complaint of No chief complaint on file. Marland Kitchen  History of Present Illness: The patient returns to the office for followup and review of the noninvasive studies. There have been no interval changes in lower extremity symptoms. No interval shortening of the patient's claudication distance or development of rest pain symptoms. No new ulcers or wounds have occurred since the last visit.  There has been a significant changes to the patient's overall health care as she has been diagnosed with bladder cancer and was treated with BCG.  The patient denies amaurosis fugax or recent TIA symptoms. There are no recent neurological changes noted.  The patient denies history of DVT, PE or superficial thrombophlebitis. The patient denies recent episodes of angina or shortness of breath.   ABI Rt=0.92 and Lt=0.83  (previous ABI's Rt=0.87 and Lt=0.80) Duplex ultrasound of the aorta iliac arteries and the bilateral lower extremities shows a high grade stenosis of the left external iliac artery with a widsely patent fem fem and bilateral fem popliteal bypasses  Carotid duplex shows widely patent RICA s/p CEA and 09-32% LICA  No outpatient prescriptions have been marked as taking for the 12/04/16 encounter (Appointment) with Delana Meyer Dolores Lory, MD.   Current Facility-Administered Medications for the 12/04/16 encounter (Appointment) with Delana Meyer, Dolores Lory, MD  Medication  . lidocaine-EPINEPHrine (XYLOCAINE W/EPI) 2 %-1:100000 (with pres) injection 1.7 mL    Past Medical History:  Diagnosis Date  . Anxiety   . Cancer ([place])   . CHF (congestive heart failure) ([place])   . Coronary artery disease   . Diabetes mellitus   . Heart murmur   . Hemorrhoid   . Hypertension   . Neuropathy   . PVD (peripheral vascular disease) ([place])   . Urothelial carcinoma of kidney ([place]) 10/31/2014   INVASIVE UROTHELIAL CARCINOMA, LOW  GRADE. T1, Nx.    Past Surgical History:  Procedure Laterality Date  . AMPUTATION TOE    . ARTERIAL BYPASS SURGRY  2009, 2013 x 2   right leg , done in [place]  . CARDIAC CATHETERIZATION    . CAROTID ENDARTERECTOMY Right 01/2014   Dr Delana Meyer  . CATARACT EXTRACTION W/PHACO Right 12/14/2014   Procedure: CATARACT EXTRACTION PHACO AND INTRAOCULAR LENS PLACEMENT (IOC);  Surgeon: Lyla Glassing, MD;  Location: [facility];  Service: Ophthalmology;  Laterality: Right;  [place]   00:38.6              AP        7.1                   CDE  2.76  . CESAREAN SECTION    . CHOLECYSTECTOMY  03-03-12   Porcelain gallbladder, gallstones,  [place]  . COLONOSCOPY W/ BIOPSIES  04/28/2012   Hyperplastic rectal polyps.  . CORONARY ARTERY BYPASS GRAFT  2009   3 vessel  . CYSTOSCOPY W/ RETROGRADES Right 09/01/2016   Procedure: CYSTOSCOPY WITH RETROGRADE PYELOGRAM;  Surgeon: Hollice Espy, MD;  Location: [facility];  Service: Urology;  Laterality: Right;  . HERNIA REPAIR  10-31-14   ventral, retro-rectus atrium mesh  . NEPHRECTOMY Left 10-31-14  . PERIPHERAL VASCULAR CATHETERIZATION Left 05/01/2015   Procedure: Lower Extremity Angiography;  Surgeon: Katha Cabal, MD;  Location: [facility];  Service: Cardiovascular;  Laterality: Left;  . PERIPHERAL VASCULAR CATHETERIZATION  05/01/2015   Procedure: Lower Extremity Intervention;  Surgeon: Katha Cabal,  MD;  Location: [place] [facility];  Service: Cardiovascular;;  . PERIPHERAL VASCULAR CATHETERIZATION Left 02/20/2015   Procedure: Pelvic Angiography;  Surgeon: Katha Cabal, MD;  Location: [facility];  Service: Cardiovascular;  Laterality: Left;  . TRANSURETHRAL RESECTION OF BLADDER TUMOR WITH MITOMYCIN-C N/A 09/01/2016   Procedure: TRANSURETHRAL RESECTION OF BLADDER TUMOR WITH MITOMYCIN-C;  Surgeon: Hollice Espy, MD;  Location: [facility];  Service: Urology;  Laterality: N/A;    Social History Social History  Substance Use Topics  .  Smoking status: Former Smoker    Packs/day: 2.00    Years: 35.00    Types: Cigarettes    Quit date: 03/30/2011  . Smokeless tobacco: Never Used  . Alcohol use No    Family History Family History  Problem Relation Age of Onset  . Cancer Mother 61       Lung Cancer  . Cancer Father 62       Lung Ca  . Diabetes Son   . Breast cancer Maternal Grandmother   . Kidney cancer Neg Hx   . Bladder Cancer Neg Hx   . Prostate cancer Neg Hx     No Known Allergies   REVIEW OF SYSTEMS (Negative unless checked)  Constitutional: [] Weight loss  [] Fever  [] Chills Cardiac: [] Chest pain   [] Chest pressure   [] Palpitations   [] Shortness of breath when laying flat   [] Shortness of breath with exertion. Vascular:  [x] Pain in legs with walking   [] Pain in legs at rest  [] History of DVT   [] Phlebitis   [] Swelling in legs   [] Varicose veins   [] Non-healing ulcers Pulmonary:   [] Uses home oxygen   [] Productive cough   [] Hemoptysis   [] Wheeze  [] COPD   [] Asthma Neurologic:  [] Dizziness   [] Seizures   [] History of stroke   [] History of TIA  [] Aphasia   [] Vissual changes   [] Weakness or numbness in arm   [] Weakness or numbness in leg Musculoskeletal:   [] Joint swelling   [x] Joint pain   [] Low back pain Hematologic:  [] Easy bruising  [] Easy bleeding   [] Hypercoagulable state   [] Anemic Gastrointestinal:  [] Diarrhea   [] Vomiting  [] Gastroesophageal reflux/heartburn   [] Difficulty swallowing. Genitourinary:  [x] Chronic kidney disease   [] Difficult urination  [] Frequent urination   [] Blood in urine Skin:  [] Rashes   [] Ulcers  Psychological:  [] History of anxiety   []  History of major depression.  Physical Examination  There were no vitals filed for this visit. There is no height or weight on file to calculate BMI. Gen: WD/WN, NAD Head: [place]/AT, No temporalis wasting.  Ear/Nose/Throat: Hearing grossly intact, nares w/o erythema or drainage Eyes: PER, EOMI, sclera nonicteric.  Neck: Supple, no large masses.     Pulmonary:  Good air movement, no audible wheezing bilaterally, no use of accessory muscles.  Cardiac: RRR, no JVD Vascular: right CEA incisional scar well healed.  Bilateral carotid bruits Vessel Right Left  Radial Palpable Palpable  Ulnar Palpable Palpable  Brachial Palpable Palpable  Carotid Palpable Palpable  Femoral Palpable Palpable  Popliteal Palpable Palpable  PT Not Palpable Not Palpable  DP Trace Palpable Trace Palpable  Gastrointestinal: Non-distended. No guarding/no peritoneal signs.  Musculoskeletal: M/S 5/5 throughout.  No deformity scars bilateral groins and medial distal thigh consistent with previous bypass surgery.  Neurologic: CN 2-12 intact. Symmetrical.  Speech is fluent. Motor exam as listed above. Psychiatric: Judgment intact, Mood & affect appropriate for pt's clinical situation. Dermatologic: No rashes or ulcers noted.  No changes  consistent with cellulitis. Lymph : No lichenification or skin changes of chronic lymphedema.  CBC Lab Results  Component Value Date   WBC 10.5 02/18/2016   HGB 12.2 09/01/2016   HCT 36.0 09/01/2016   MCV 87.0 02/18/2016   PLT 266.0 02/18/2016    BMET    Component Value Date/Time   NA 139 09/30/2016 0854   NA 139 09/30/2016 0854   NA 135 11/02/2014 0609   K 4.6 09/30/2016 0854   K 4.6 09/30/2016 0854   K 4.2 11/02/2014 0609   CL 107 09/30/2016 0854   CL 107 09/30/2016 0854   CL 107 11/02/2014 0609   CO2 27 09/30/2016 0854   CO2 27 09/30/2016 0854   CO2 23 11/02/2014 0609   GLUCOSE 168 (H) 09/30/2016 0854   GLUCOSE 168 (H) 09/30/2016 0854   GLUCOSE 108 (H) 11/02/2014 0609   BUN 25 (H) 09/30/2016 0854   BUN 25 (H) 09/30/2016 0854   BUN 20 11/02/2014 0609   CREATININE 1.05 09/30/2016 0854   CREATININE 1.05 09/30/2016 0854   CREATININE 1.01 11/09/2015 1549   CREATININE 1.01 11/09/2015 1549   CALCIUM 9.1 09/30/2016 0854   CALCIUM 9.1 09/30/2016 0854   CALCIUM 7.3 (L) 11/02/2014 0609   GFRNONAA 43 (L) 08/27/2016  1335   GFRNONAA 50 (L) 11/02/2014 0609   GFRAA 50 (L) 08/27/2016 1335   GFRAA 58 (L) 11/02/2014 0609   CrCl cannot be calculated (Patient's most recent lab result is older than the maximum 21 days allowed.).  COAG Lab Results  Component Value Date   INR 0.9 10/17/2014   INR 1.1 01/19/2014   INR 0.9 12/06/2013    Radiology No results found.   Assessment/Plan 1. PVD (peripheral vascular disease) ([address]) Recommend:  The patient has experienced increased symptoms and is now describing lifestyle limiting claudication and mild rest pain.  Noninvasive studies show a focal high grade stenosis of the left external iliac artery.  Both legas and all of her bypasses are based off the left iliac and therefore it is imperative to maintain optimal flow.   Given the severity of the patient's lower extremity symptoms the patient should undergo angiography and intervention.  Risk and benefits were reviewed the patient.  Indications for the procedure were reviewed.  All questions were answered, the patient agrees to proceed.   The patient should continue walking and begin a more formal exercise program.  The patient should continue antiplatelet therapy and aggressive treatment of the lipid abnormalities  The patient will follow up with me after the angiogram.   2. Bilateral carotid artery stenosis Recommend:  Given the patient's asymptomatic subcritical stenosis no further invasive testing or surgery at this time.  Duplex ultrasound shows minimal plaque in the RICA and 17-61% in the LICA stenosis bilaterally.  Continue antiplatelet therapy as prescribed Continue management of CAD, HTN and Hyperlipidemia Healthy heart diet,  encouraged exercise at least 4 times per week Follow up in 6 months with duplex ultrasound and physical exam based on >50% stenosis of the LICA carotid artery   3. Essential hypertension Continue antihypertensive medications as already ordered, these medications have been  reviewed and there are no changes at this time.   4. Controlled type 2 DM with peripheral circulatory disorder (HCC) Continue hypoglycemic medications as already ordered, these medications have been reviewed and there are no changes at this time.  Hgb A1C to be monitored as already arranged by primary service   5. Coronary artery abnormality Continue cardiac and  antihypertensive medications as already ordered and reviewed, no changes at this time.  Continue statin as ordered and reviewed, no changes at this time  Nitrates PRN for chest pain   6. Cardiac arrhythmia, unspecified cardiac arrhythmia type Continue antiarrhythmia medications as already ordered, these medications have been reviewed and there are no changes at this time.  Continue anticoagulation as ordered by Cardiology Service      Hortencia Pilar, MD  12/03/2016 2:25 PM

## 2016-12-04 ENCOUNTER — Other Ambulatory Visit (INDEPENDENT_AMBULATORY_CARE_PROVIDER_SITE_OTHER): Payer: Self-pay | Admitting: Vascular Surgery

## 2016-12-04 ENCOUNTER — Other Ambulatory Visit (INDEPENDENT_AMBULATORY_CARE_PROVIDER_SITE_OTHER): Payer: Medicare Other

## 2016-12-04 ENCOUNTER — Ambulatory Visit (INDEPENDENT_AMBULATORY_CARE_PROVIDER_SITE_OTHER): Payer: Medicare Other | Admitting: Vascular Surgery

## 2016-12-04 ENCOUNTER — Encounter (INDEPENDENT_AMBULATORY_CARE_PROVIDER_SITE_OTHER): Payer: Self-pay | Admitting: Vascular Surgery

## 2016-12-04 ENCOUNTER — Encounter (INDEPENDENT_AMBULATORY_CARE_PROVIDER_SITE_OTHER): Payer: Self-pay

## 2016-12-04 VITALS — BP 122/68 | HR 76 | Resp 16 | Wt 162.0 lb

## 2016-12-04 DIAGNOSIS — I739 Peripheral vascular disease, unspecified: Secondary | ICD-10-CM

## 2016-12-04 DIAGNOSIS — I6523 Occlusion and stenosis of bilateral carotid arteries: Secondary | ICD-10-CM

## 2016-12-04 DIAGNOSIS — I499 Cardiac arrhythmia, unspecified: Secondary | ICD-10-CM

## 2016-12-04 DIAGNOSIS — Q245 Malformation of coronary vessels: Secondary | ICD-10-CM | POA: Diagnosis not present

## 2016-12-04 DIAGNOSIS — E1151 Type 2 diabetes mellitus with diabetic peripheral angiopathy without gangrene: Secondary | ICD-10-CM | POA: Diagnosis not present

## 2016-12-04 DIAGNOSIS — I10 Essential (primary) hypertension: Secondary | ICD-10-CM | POA: Diagnosis not present

## 2016-12-04 DIAGNOSIS — I6529 Occlusion and stenosis of unspecified carotid artery: Secondary | ICD-10-CM | POA: Insufficient documentation

## 2016-12-04 LAB — VAS US CAROTID
LCCADDIAS: 10 cm/s
LCCAPDIAS: 16 cm/s
LEFT ECA DIAS: 28 cm/s
LICADDIAS: -23 cm/s
LICAPDIAS: 66 cm/s
Left CCA dist sys: 43 cm/s
Left CCA prox sys: 96 cm/s
Left ICA dist sys: -118 cm/s
Left ICA prox sys: 265 cm/s
RCCAPSYS: 127 cm/s
RIGHT CCA MID DIAS: 16 cm/s
RIGHT ECA DIAS: -19 cm/s
Right CCA prox dias: 18 cm/s
Right cca dist sys: -68 cm/s

## 2016-12-04 LAB — VAS US LOWER EXTREMITY BYPASS GRAFT DUPLEX
LATIBDISTSYS: 70 cm/s
LEFT PERO DIST SYS: 41 cm/s
LPTIBDISTSYS: 18 cm/s
RIGHT ANT DIST TIBAL SYS PSV: 41 cm/s
RIGHT POST TIB DIST SYS: 21 cm/s
Right peroneal sys PSV: 32 cm/s

## 2016-12-08 ENCOUNTER — Other Ambulatory Visit (INDEPENDENT_AMBULATORY_CARE_PROVIDER_SITE_OTHER): Payer: Self-pay | Admitting: Vascular Surgery

## 2016-12-09 ENCOUNTER — Encounter
Admission: RE | Admit: 2016-12-09 | Discharge: 2016-12-09 | Disposition: A | Discharge status: Home / Self Care | Payer: Medicare Other | Source: Ambulatory Visit | Attending: Vascular Surgery | Admitting: Vascular Surgery

## 2016-12-09 DIAGNOSIS — I70213 Atherosclerosis of native arteries of extremities with intermittent claudication, bilateral legs: Secondary | ICD-10-CM | POA: Diagnosis not present

## 2016-12-09 DIAGNOSIS — Z801 Family history of malignant neoplasm of trachea, bronchus and lung: Secondary | ICD-10-CM | POA: Diagnosis not present

## 2016-12-09 DIAGNOSIS — Z955 Presence of coronary angioplasty implant and graft: Secondary | ICD-10-CM | POA: Diagnosis not present

## 2016-12-09 DIAGNOSIS — Z951 Presence of aortocoronary bypass graft: Secondary | ICD-10-CM | POA: Diagnosis not present

## 2016-12-09 DIAGNOSIS — I110 Hypertensive heart disease with heart failure: Secondary | ICD-10-CM | POA: Diagnosis not present

## 2016-12-09 DIAGNOSIS — Z833 Family history of diabetes mellitus: Secondary | ICD-10-CM | POA: Diagnosis not present

## 2016-12-09 DIAGNOSIS — Z905 Acquired absence of kidney: Secondary | ICD-10-CM | POA: Diagnosis not present

## 2016-12-09 DIAGNOSIS — I6523 Occlusion and stenosis of bilateral carotid arteries: Secondary | ICD-10-CM | POA: Diagnosis not present

## 2016-12-09 DIAGNOSIS — I429 Cardiomyopathy, unspecified: Secondary | ICD-10-CM | POA: Diagnosis not present

## 2016-12-09 DIAGNOSIS — Z85528 Personal history of other malignant neoplasm of kidney: Secondary | ICD-10-CM | POA: Diagnosis not present

## 2016-12-09 DIAGNOSIS — Z9889 Other specified postprocedural states: Secondary | ICD-10-CM | POA: Diagnosis not present

## 2016-12-09 DIAGNOSIS — Z87891 Personal history of nicotine dependence: Secondary | ICD-10-CM | POA: Diagnosis not present

## 2016-12-09 DIAGNOSIS — Z803 Family history of malignant neoplasm of breast: Secondary | ICD-10-CM | POA: Diagnosis not present

## 2016-12-09 DIAGNOSIS — E1140 Type 2 diabetes mellitus with diabetic neuropathy, unspecified: Secondary | ICD-10-CM | POA: Diagnosis not present

## 2016-12-09 DIAGNOSIS — I509 Heart failure, unspecified: Secondary | ICD-10-CM | POA: Diagnosis not present

## 2016-12-09 DIAGNOSIS — Z9049 Acquired absence of other specified parts of digestive tract: Secondary | ICD-10-CM | POA: Diagnosis not present

## 2016-12-09 DIAGNOSIS — E1151 Type 2 diabetes mellitus with diabetic peripheral angiopathy without gangrene: Secondary | ICD-10-CM | POA: Diagnosis not present

## 2016-12-09 DIAGNOSIS — Z9841 Cataract extraction status, right eye: Secondary | ICD-10-CM | POA: Diagnosis not present

## 2016-12-09 DIAGNOSIS — Z89429 Acquired absence of other toe(s), unspecified side: Secondary | ICD-10-CM | POA: Diagnosis not present

## 2016-12-09 HISTORY — DX: Malignant neoplasm of bladder, unspecified: C67.9

## 2016-12-09 LAB — CREATININE, SERUM
Creatinine, Ser: 0.99 mg/dL (ref 0.44–1.00)
GFR calc Af Amer: >60 mL/min (ref >60)

## 2016-12-09 LAB — BUN: BUN: 33 mg/dL — AB (ref 6–20)

## 2016-12-10 ENCOUNTER — Encounter: Payer: Self-pay | Admitting: *Deleted

## 2016-12-10 ENCOUNTER — Encounter
Admission: RE | Disposition: A | Discharge status: Home / Self Care | Payer: Self-pay | Source: Ambulatory Visit | Attending: Vascular Surgery

## 2016-12-10 ENCOUNTER — Ambulatory Visit
Admission: RE | Admit: 2016-12-10 | Discharge: 2016-12-10 | Disposition: A | Discharge status: Home / Self Care | Payer: Medicare Other | Source: Ambulatory Visit | Attending: Vascular Surgery | Admitting: Vascular Surgery

## 2016-12-10 DIAGNOSIS — Z951 Presence of aortocoronary bypass graft: Secondary | ICD-10-CM | POA: Insufficient documentation

## 2016-12-10 DIAGNOSIS — I70213 Atherosclerosis of native arteries of extremities with intermittent claudication, bilateral legs: Secondary | ICD-10-CM | POA: Insufficient documentation

## 2016-12-10 DIAGNOSIS — Z9841 Cataract extraction status, right eye: Secondary | ICD-10-CM | POA: Diagnosis not present

## 2016-12-10 DIAGNOSIS — Z9049 Acquired absence of other specified parts of digestive tract: Secondary | ICD-10-CM | POA: Diagnosis not present

## 2016-12-10 DIAGNOSIS — Z89429 Acquired absence of other toe(s), unspecified side: Secondary | ICD-10-CM | POA: Insufficient documentation

## 2016-12-10 DIAGNOSIS — Z85528 Personal history of other malignant neoplasm of kidney: Secondary | ICD-10-CM | POA: Insufficient documentation

## 2016-12-10 DIAGNOSIS — I70212 Atherosclerosis of native arteries of extremities with intermittent claudication, left leg: Secondary | ICD-10-CM | POA: Diagnosis not present

## 2016-12-10 DIAGNOSIS — Z803 Family history of malignant neoplasm of breast: Secondary | ICD-10-CM | POA: Diagnosis not present

## 2016-12-10 DIAGNOSIS — I429 Cardiomyopathy, unspecified: Secondary | ICD-10-CM | POA: Diagnosis not present

## 2016-12-10 DIAGNOSIS — Z905 Acquired absence of kidney: Secondary | ICD-10-CM | POA: Diagnosis not present

## 2016-12-10 DIAGNOSIS — I509 Heart failure, unspecified: Secondary | ICD-10-CM | POA: Insufficient documentation

## 2016-12-10 DIAGNOSIS — E1140 Type 2 diabetes mellitus with diabetic neuropathy, unspecified: Secondary | ICD-10-CM | POA: Insufficient documentation

## 2016-12-10 DIAGNOSIS — Z833 Family history of diabetes mellitus: Secondary | ICD-10-CM | POA: Diagnosis not present

## 2016-12-10 DIAGNOSIS — Z955 Presence of coronary angioplasty implant and graft: Secondary | ICD-10-CM | POA: Diagnosis not present

## 2016-12-10 DIAGNOSIS — Z801 Family history of malignant neoplasm of trachea, bronchus and lung: Secondary | ICD-10-CM | POA: Diagnosis not present

## 2016-12-10 DIAGNOSIS — Z87891 Personal history of nicotine dependence: Secondary | ICD-10-CM | POA: Insufficient documentation

## 2016-12-10 DIAGNOSIS — E1151 Type 2 diabetes mellitus with diabetic peripheral angiopathy without gangrene: Secondary | ICD-10-CM | POA: Diagnosis not present

## 2016-12-10 DIAGNOSIS — Z9889 Other specified postprocedural states: Secondary | ICD-10-CM | POA: Diagnosis not present

## 2016-12-10 DIAGNOSIS — I110 Hypertensive heart disease with heart failure: Secondary | ICD-10-CM | POA: Insufficient documentation

## 2016-12-10 DIAGNOSIS — I6523 Occlusion and stenosis of bilateral carotid arteries: Secondary | ICD-10-CM | POA: Diagnosis not present

## 2016-12-10 HISTORY — PX: LOWER EXTREMITY ANGIOGRAPHY: CATH118251

## 2016-12-10 LAB — GLUCOSE, CAPILLARY
Glucose-Capillary: 134 mg/dL — ABNORMAL HIGH (ref 65–99)
Glucose-Capillary: 117 mg/dL — ABNORMAL HIGH (ref 65–99)

## 2016-12-10 SURGERY — LOWER EXTREMITY ANGIOGRAPHY
Anesthesia: Moderate Sedation | Laterality: Left

## 2016-12-10 MED ORDER — SODIUM CHLORIDE 0.9 % IV SOLN
INTRAVENOUS | Status: DC
  Administered 2016-12-10: 1000 mL via INTRAVENOUS

## 2016-12-10 MED ORDER — ONDANSETRON HCL 4 MG/2ML IJ SOLN: 4.0000 mg | Freq: Four times a day (QID) | INTRAMUSCULAR | Status: DC | PRN

## 2016-12-10 MED ORDER — HYDRALAZINE HCL 20 MG/ML IJ SOLN: 5.0000 mg | INTRAMUSCULAR | Status: DC | PRN

## 2016-12-10 MED ORDER — LIDOCAINE HCL (PF) 1 % IJ SOLN
INTRAMUSCULAR | Status: AC
  Filled 2016-12-10: qty 30

## 2016-12-10 MED ORDER — CEFAZOLIN SODIUM-DEXTROSE 1-4 GM/50ML-% IV SOLN: 1.0000 g | Freq: Once | INTRAVENOUS | Status: DC

## 2016-12-10 MED ORDER — MIDAZOLAM HCL 2 MG/2ML IJ SOLN
INTRAMUSCULAR | Status: DC | PRN
  Administered 2016-12-10 (×2): 2 mg via INTRAVENOUS

## 2016-12-10 MED ORDER — OXYCODONE HCL 5 MG PO TABS: 5.0000 mg | ORAL_TABLET | ORAL | Status: DC | PRN

## 2016-12-10 MED ORDER — ACETAMINOPHEN 325 MG RE SUPP
325.0000 mg | RECTAL | Status: DC | PRN
  Filled 2016-12-10: qty 2

## 2016-12-10 MED ORDER — METHYLPREDNISOLONE SODIUM SUCC 125 MG IJ SOLR: 125.0000 mg | INTRAMUSCULAR | Status: DC | PRN

## 2016-12-10 MED ORDER — LABETALOL HCL 5 MG/ML IV SOLN: 10.0000 mg | INTRAVENOUS | Status: DC | PRN

## 2016-12-10 MED ORDER — HYDROMORPHONE HCL 1 MG/ML IJ SOLN: 1.0000 mg | Freq: Once | INTRAMUSCULAR | Status: DC | PRN

## 2016-12-10 MED ORDER — HEPARIN SODIUM (PORCINE) 1000 UNIT/ML IJ SOLN
INTRAMUSCULAR | Status: AC
  Filled 2016-12-10: qty 1

## 2016-12-10 MED ORDER — MIDAZOLAM HCL 5 MG/5ML IJ SOLN
INTRAMUSCULAR | Status: AC
  Filled 2016-12-10: qty 5

## 2016-12-10 MED ORDER — SODIUM CHLORIDE 0.9 % IV SOLN: 1.0000 mL/kg/h | INTRAVENOUS | Status: DC

## 2016-12-10 MED ORDER — HEPARIN SODIUM (PORCINE) 1000 UNIT/ML IJ SOLN
INTRAMUSCULAR | Status: DC | PRN
  Administered 2016-12-10: 4000 [IU] via INTRAVENOUS

## 2016-12-10 MED ORDER — PHENOL 1.4 % MT LIQD
1.0000 | OROMUCOSAL | Status: DC | PRN
  Filled 2016-12-10: qty 177

## 2016-12-10 MED ORDER — FAMOTIDINE 20 MG PO TABS: 40.0000 mg | ORAL_TABLET | ORAL | Status: DC | PRN

## 2016-12-10 MED ORDER — FENTANYL CITRATE (PF) 100 MCG/2ML IJ SOLN
INTRAMUSCULAR | Status: AC
  Filled 2016-12-10: qty 2

## 2016-12-10 MED ORDER — ACETAMINOPHEN 325 MG PO TABS: 325.0000 mg | ORAL_TABLET | ORAL | Status: DC | PRN

## 2016-12-10 MED ORDER — HYDROMORPHONE HCL 1 MG/ML IJ SOLN: 0.5000 mg | INTRAMUSCULAR | Status: DC | PRN

## 2016-12-10 MED ORDER — SODIUM CHLORIDE 0.9 % IV SOLN: 500.0000 mL | Freq: Once | INTRAVENOUS | Status: DC | PRN

## 2016-12-10 MED ORDER — FENTANYL CITRATE (PF) 100 MCG/2ML IJ SOLN
INTRAMUSCULAR | Status: DC | PRN
  Administered 2016-12-10 (×2): 50 ug via INTRAVENOUS

## 2016-12-10 MED ORDER — GUAIFENESIN-DM 100-10 MG/5ML PO SYRP
15.0000 mL | ORAL_SOLUTION | ORAL | Status: DC | PRN
  Filled 2016-12-10: qty 15

## 2016-12-10 MED ORDER — ALUM & MAG HYDROXIDE-SIMETH 200-200-20 MG/5ML PO SUSP: 15.0000 mL | ORAL | Status: DC | PRN

## 2016-12-10 MED ORDER — METOPROLOL TARTRATE 5 MG/5ML IV SOLN: 2.0000 mg | INTRAVENOUS | Status: DC | PRN

## 2016-12-10 SURGICAL SUPPLY — 19 items
BALLN LUTONIX DCB 6X40X130 (BALLOONS) ×3
BALLN LUTONIX DCB 6X60X130 (BALLOONS) ×6
BALLOON LUTONIX DCB 6X40X130 (BALLOONS) ×1 IMPLANT
BALLOON LUTONIX DCB 6X60X130 (BALLOONS) ×2 IMPLANT
CANNULA 5F STIFF (CANNULA) ×3 IMPLANT
CATH BEACON 5 .035 40 KMP TP (CATHETERS) ×1 IMPLANT
CATH BEACON 5 .038 40 KMP TP (CATHETERS) ×2
CATH PIG 70CM (CATHETERS) ×3 IMPLANT
DEVICE CLOSURE MYNXGRIP 5F (Vascular Products) ×3 IMPLANT
DEVICE PRESTO INFLATION (MISCELLANEOUS) ×3 IMPLANT
DEVICE STARCLOSE SE CLOSURE (Vascular Products) IMPLANT
GUIDEWIRE ANGLED .035 180CM (WIRE) ×3 IMPLANT
GUIDEWIRE SUPER STIFF .035X180 (WIRE) ×3 IMPLANT
NEEDLE ENTRY 21GA 7CM ECHOTIP (NEEDLE) ×3 IMPLANT
PACK ANGIOGRAPHY (CUSTOM PROCEDURE TRAY) ×3 IMPLANT
SET INTRO CAPELLA COAXIAL (SET/KITS/TRAYS/PACK) ×3 IMPLANT
SHEATH BRITE TIP 5FRX11 (SHEATH) ×3 IMPLANT
WIRE J 3MM .035X145CM (WIRE) ×3 IMPLANT
WIRE MAGIC TORQUE 260C (WIRE) ×3 IMPLANT

## 2016-12-10 NOTE — Op Note (Signed)
[name] Percutaneous Study/Intervention Procedural Note   Date of Surgery: 12/10/2016  Surgeon: Hortencia Pilar  Pre-operative Diagnosis: Atherosclerotic occlusive disease bilateral lower extremities with bilateral lower extremity claudication; greater than 70% stenosis of the distal left external iliac  Post-operative diagnosis: Same  Procedure(s) Performed: 1. Introduction catheter into left lower extremity first order catheter placement  2. Contrast injection left lower extremity   3. Percutaneous transluminal angioplasty left common femoral artery to 6 mm with a Lutonix drug-eluting balloon 4. Percutaneous transluminal angioplasty left external iliac artery with a 6 mm Lutonix drug-eluting balloon  5. Minx close closure left femoral popliteal bypass arteriotomy  Anesthesia: Conscious sedation was administered under my direct supervision by the interventional radiology RN. IV Versed plus fentanyl were utilized. Continuous ECG, pulse oximetry and blood pressure was monitored throughout the entire procedure.  Conscious sedation was for a total of 1 hour 5 minutes.  Sheath: 5 French sheath left femoral popliteal bypass  Contrast: 80 cc  Fluoroscopy Time: 5.5 minutes  Indications: Laura Reed presents with increasing pain of the bilateral lower extremity. She has an extensive history of vascular surgery with multiple revascularizations she's had multiple stents placed. She is undergone bilateral femoral-popliteal bypass grafting with PTFE. She is also required a femoral to femoral bypass graft left-to-right. On recent noninvasive monitoring she was found to have a greater than 70% stenosis in the external iliac on the left. Is also question of hemodynamically significant velocity shifts in the region of the common femoral at the anastomosis. Given that her entire lower extremity arterial  perfusion is based off of her left iliac system I recommended angiography to treat these hemodynamically significant lesions.  The risks and benefits are reviewed all questions answered patient agrees to proceed.  Procedure:Laura Reed is a 56 y.o. y.o. female who was identified and appropriate procedural time out was performed. The patient was then placed supine on the table and prepped and draped in the usual sterile fashion.   Ultrasound was placed in the sterile sleeve and the left groin was evaluated the anastomotic region of the common femoral artery.  I identified the femoral-femoral anastomosis as well as the origin the femoropopliteal on the left side. Given the reconstruction of the left groin I elected to scan more distally in the femoral popliteal bypass and selected an area here for access.  The femoral-popliteal prosthetic bypass was echolucent and pulsatile indicating patency. Image was recorded for the permanent record and under real-time visualization a microneedle was inserted into the common femoral artery followed by the microwire and then the micro-sheath. A J-wire was then advanced through the micro-sheath and a 5 [place] sheath was then inserted over a J-wire. J-wire was then advanced and a 5 French pigtail catheter was positioned at the level of T12.  AP projection of the aorta was then obtained. Pigtail catheter was repositioned to above the bifurcation and multiple views of the pelvis were obtained. Multiple magnified images of the external iliac on the left as well as the actual common femoral and anastomotic region were also made. Once the stenoses were identified the patient was heparinized.  4000 units of heparin was then given and allowed to circulate.  A Magic torque wire was then negotiated into the aorta under fluoroscopic guidance. A 6 x 60 Lutonix balloon was then advanced across the proximal half of the external iliac inflated to 12 atm for 2 minutes. A second 6  x 60 Lutonix balloon was then advanced  across the distal half of the external iliac. Follow-up imaging demonstrated complete resolution of the stricture with full expansion of the pre-existing stent.  The detector was then repositioned and under magnified steep LAO projection the common femoral was imaged narrowing at the origins of the bypass grafts with the common femoral was identified. Therefore, a 6 x 40 Lutonix balloon was advanced across this area and inflated to 14 atm for 2 minutes. Follow-up imaging demonstrated less than 10% residual stenosis at this location with excellent treatment of the stricture.  After review of these images the sheath is repositioned and an LAO projection of femoral popliteal bypass graft is obtained and a minx close device deployed. There no immediate Complications.  Findings: The abdominal aorta is opacified with a bolus injection contrast. Renal arteries are single and appear patent. The aorta itself has diffuse disease but no hemodynamically significant lesions. The right common iliac artery has a stent previously placed and has diffuse disease. The right external iliac artery is occluded. There is a greater than 90% stenosis at the origin of the right internal iliac artery. On the left the common iliac artery is patent although diffusely diseased there are no hemodynamically significant lesions. The external iliac artery has been stented from its origin down to the ilioinguinal ligament and there is diffuse in-stent restenosis with several high-grade areas of greater than 70% in the midportion proximally as well as at the distal edge of the stent.   The left common femoral is patent as is the left profunda femoris.  The SFA is occluded. The femoral-femoral and femoral-popliteal bypasses are widely patent. There is a 60-70% narrowing of the common femoral at the level of the anastomoses of the 2 bypass grafts.  Following angioplasty external iliac artery is now  widely patent with 0 residual stenosis. Angioplasty of the common femoral artery yields an excellent result with less than 10% residual stenosis.  Summary: Successful recanalization bilateral lower extremity for limb salvage   Disposition: Patient was taken to the recovery room in stable condition having tolerated the procedure well.  Belenda Cruise Sharonica Kraszewski 12/10/2016,4:35 PM

## 2016-12-10 NOTE — H&P (Signed)
[facility] History & Physical Update  The patient was interviewed and re-examined.  The patient's previous History and Physical has been reviewed and is unchanged.  There is no change in the plan of care. We plan to proceed with the scheduled procedure.  Hortencia Pilar, MD  12/10/2016, 2:29 PM

## 2016-12-10 NOTE — Progress Notes (Signed)
Unable to void-pt. Very uncomfortable.  In and out cath without difficulty-1000 ml obtained.

## 2016-12-11 ENCOUNTER — Encounter: Payer: Self-pay | Admitting: Vascular Surgery

## 2016-12-11 ENCOUNTER — Ambulatory Visit: Payer: Medicare Other | Admitting: Internal Medicine

## 2016-12-17 DIAGNOSIS — E113553 Type 2 diabetes mellitus with stable proliferative diabetic retinopathy, bilateral: Secondary | ICD-10-CM | POA: Diagnosis not present

## 2016-12-18 ENCOUNTER — Encounter: Payer: Self-pay | Admitting: Vascular Surgery

## 2016-12-22 DIAGNOSIS — Z794 Long term (current) use of insulin: Secondary | ICD-10-CM | POA: Diagnosis not present

## 2016-12-22 DIAGNOSIS — B351 Tinea unguium: Secondary | ICD-10-CM | POA: Diagnosis not present

## 2016-12-22 DIAGNOSIS — E1140 Type 2 diabetes mellitus with diabetic neuropathy, unspecified: Secondary | ICD-10-CM | POA: Diagnosis not present

## 2016-12-22 DIAGNOSIS — L851 Acquired keratosis [keratoderma] palmaris et plantaris: Secondary | ICD-10-CM | POA: Diagnosis not present

## 2016-12-26 ENCOUNTER — Other Ambulatory Visit: Payer: Self-pay | Admitting: Internal Medicine

## 2017-01-05 ENCOUNTER — Ambulatory Visit (INDEPENDENT_AMBULATORY_CARE_PROVIDER_SITE_OTHER): Payer: Medicare Other | Admitting: Vascular Surgery

## 2017-01-05 ENCOUNTER — Encounter (INDEPENDENT_AMBULATORY_CARE_PROVIDER_SITE_OTHER): Payer: Medicare Other

## 2017-01-14 ENCOUNTER — Other Ambulatory Visit (INDEPENDENT_AMBULATORY_CARE_PROVIDER_SITE_OTHER): Payer: Self-pay | Admitting: Vascular Surgery

## 2017-01-14 DIAGNOSIS — M79606 Pain in leg, unspecified: Secondary | ICD-10-CM

## 2017-01-15 ENCOUNTER — Ambulatory Visit (INDEPENDENT_AMBULATORY_CARE_PROVIDER_SITE_OTHER): Payer: Medicare Other

## 2017-01-15 ENCOUNTER — Encounter (INDEPENDENT_AMBULATORY_CARE_PROVIDER_SITE_OTHER): Payer: Self-pay | Admitting: Vascular Surgery

## 2017-01-15 ENCOUNTER — Ambulatory Visit (INDEPENDENT_AMBULATORY_CARE_PROVIDER_SITE_OTHER): Payer: Medicare Other | Admitting: Vascular Surgery

## 2017-01-15 VITALS — BP 80/54 | HR 65 | Resp 15 | Ht 67.0 in | Wt 163.0 lb

## 2017-01-15 DIAGNOSIS — Q245 Malformation of coronary vessels: Secondary | ICD-10-CM | POA: Diagnosis not present

## 2017-01-15 DIAGNOSIS — M79606 Pain in leg, unspecified: Secondary | ICD-10-CM | POA: Diagnosis not present

## 2017-01-15 DIAGNOSIS — I10 Essential (primary) hypertension: Secondary | ICD-10-CM | POA: Diagnosis not present

## 2017-01-15 DIAGNOSIS — I70213 Atherosclerosis of native arteries of extremities with intermittent claudication, bilateral legs: Secondary | ICD-10-CM

## 2017-01-15 DIAGNOSIS — I6523 Occlusion and stenosis of bilateral carotid arteries: Secondary | ICD-10-CM | POA: Diagnosis not present

## 2017-01-18 DIAGNOSIS — I70219 Atherosclerosis of native arteries of extremities with intermittent claudication, unspecified extremity: Secondary | ICD-10-CM | POA: Insufficient documentation

## 2017-01-18 NOTE — Progress Notes (Signed)
MRN : [id number]  Laura Reed is a 56 y.o. (09/15/1960) female who presents with chief complaint of  Chief Complaint  Patient presents with  . Re-evaluation    2 weeks [facility] ABI  .  History of Present Illness: The patient returns to the office for followup and review of the noninvasive studies. There have been no interval changes in lower extremity symptoms. No interval shortening of the patient's claudication distance or development of rest pain symptoms. No new ulcers or wounds have occurred since the last visit.  There have been no significant changes to the patient's overall health care.  The patient denies amaurosis fugax or recent TIA symptoms. There are no recent neurological changes noted. The patient denies history of DVT, PE or superficial thrombophlebitis. The patient denies recent episodes of angina or shortness of breath.   ABI Rt=0.94 and Lt=0.93   Current Meds  Medication Sig  . ACCU-CHEK AVIVA PLUS test strip CHECK BLOOD SUGAR TWO TO THREE TIMES DAILY  . acetaminophen (TYLENOL) 500 MG tablet Take 1,000 mg by mouth every 6 (six) hours as needed for mild pain or headache.   . AMITIZA 24 MCG capsule TAKE ONE CAPSULE BY MOUTH TWICE DAILY WITH A MEAL  . amLODipine (NORVASC) 5 MG tablet Take 1 tablet (5 mg total) by mouth daily.  Marland Kitchen aspirin 81 MG chewable tablet Chew 81 mg by mouth daily.  Marland Kitchen atorvastatin (LIPITOR) 20 MG tablet TAKE 1 TABLET BY MOUTH EVERY DAY  . B-D ULTRAFINE III SHORT PEN 31G X 8 MM MISC TEST BLOOD SUGAR TWICE DAILY  . B-D ULTRAFINE III SHORT PEN 31G X 8 MM MISC TEST BLOOD SUGAR TWICE DAILY  . benzonatate (TESSALON) 100 MG capsule Take 1 capsule (100 mg total) by mouth 2 (two) times daily as needed for cough.  . Cholecalciferol (VITAMIN D3) 2000 UNITS TABS Take 2,000 Units by mouth daily after supper.   . clopidogrel (PLAVIX) 75 MG tablet TAKE 1 TABLET BY MOUTH EVERY DAY  . DIGOX 125 MCG tablet TAKE 1 TABLET BY MOUTH EVERY DAY (Patient taking  differently: TAKE 1 TABLET BY MOUTH EVERY DAY IN THE EVENING)  . diphenhydrAMINE (BENADRYL) 25 mg capsule Take 25 mg by mouth at bedtime as needed for sleep.  Marland Kitchen gabapentin (NEURONTIN) 600 MG tablet TAKE 1 TABLET BY MOUTH THREE TIMES DAILY (Patient taking differently: TAKE 1 TABLET BY MOUTH THREE TIMES DAILY AS NEEDED FOR  RLS)  . HUMALOG MIX 75/25 KWIKPEN (75-25) 100 UNIT/ML Kwikpen INJECT 30 UNITS UNDER THE SKIN EVERY MORNING AND 36 UNITS EVERY EVENING  . HYDROcodone-acetaminophen (NORCO/VICODIN) 5-325 MG tablet Take 1-2 tablets by mouth every 6 (six) hours as needed for moderate pain.  . Lactulose 20 GM/30ML SOLN 30 ml every 4 hours until constipation is relieved (Patient taking differently: Take 30 mLs by mouth See admin instructions. 30 ml every 4 hours until constipation is relieved)  . LEVEMIR FLEXTOUCH 100 UNIT/ML Pen INJECT 40 UNITS UNDER THE SKIN EVERY DAY AT 10 PM  . LORazepam (ATIVAN) 0.5 MG tablet TAKE 1 TABLET BY MOUTH AT BEDTIME, MAY REPEAT IN 1 HOUR IF NEEDED (Patient taking differently: TAKE 1 TABLET BY MOUTH AT BEDTIME, MAY REPEAT IN 1 HOUR IF NEEDED FOR SLEEP)  . mometasone (ELOCON) 0.1 % lotion Apply 1-2 application topically daily as needed (into ear canal for ear irriation).   . TRUEPLUS LANCETS 28G MISC CHECK BLOOD SUGAR FOUR TIMES DAILY   Current Facility-Administered Medications for the 01/15/17 encounter (Office  Visit) with Delana Meyer, Dolores Lory, MD  Medication  . lidocaine-EPINEPHrine (XYLOCAINE W/EPI) 2 %-1:100000 (with pres) injection 1.7 mL    Past Medical History:  Diagnosis Date  . Anxiety   . Bladder cancer ([place])   . Cancer ([place])   . CHF (congestive heart failure) ([place])   . Coronary artery disease   . Diabetes mellitus   . Heart murmur   . Hemorrhoid   . Hypertension   . Neuropathy   . PVD (peripheral vascular disease) ([place])   . Urothelial carcinoma of kidney ([place]) 10/31/2014   INVASIVE UROTHELIAL CARCINOMA, LOW GRADE. T1, Nx.    Past Surgical History:    Procedure Laterality Date  . AMPUTATION TOE    . ARTERIAL BYPASS SURGRY  2009, 2013 x 2   right leg , done in [place]  . CARDIAC CATHETERIZATION    . CAROTID ENDARTERECTOMY Right 01/2014   Dr Delana Meyer  . CATARACT EXTRACTION W/PHACO Right 12/14/2014   Procedure: CATARACT EXTRACTION PHACO AND INTRAOCULAR LENS PLACEMENT (IOC);  Surgeon: Lyla Glassing, MD;  Location: [facility] ORS;  Service: Ophthalmology;  Laterality: Right;  [place]   00:38.6              AP        7.1                   CDE  2.76  . CESAREAN SECTION    . CHOLECYSTECTOMY  03-03-12   Porcelain gallbladder, gallstones,  [place]  . COLONOSCOPY W/ BIOPSIES  04/28/2012   Hyperplastic rectal polyps.  . CORONARY ARTERY BYPASS GRAFT  2009   3 vessel  . CYSTOSCOPY W/ RETROGRADES Right 09/01/2016   Procedure: CYSTOSCOPY WITH RETROGRADE PYELOGRAM;  Surgeon: Hollice Espy, MD;  Location: [facility] ORS;  Service: Urology;  Laterality: Right;  . HERNIA REPAIR  10-31-14   ventral, retro-rectus atrium mesh  . LOWER EXTREMITY ANGIOGRAPHY Left 12/10/2016   Procedure: Lower Extremity Angiography;  Surgeon: Katha Cabal, MD;  Location: [facility];  Service: Cardiovascular;  Laterality: Left;  . NEPHRECTOMY Left 10-31-14  . PERIPHERAL VASCULAR CATHETERIZATION Left 05/01/2015   Procedure: Lower Extremity Angiography;  Surgeon: Katha Cabal, MD;  Location: [facility];  Service: Cardiovascular;  Laterality: Left;  . PERIPHERAL VASCULAR CATHETERIZATION  05/01/2015   Procedure: Lower Extremity Intervention;  Surgeon: Katha Cabal, MD;  Location: [facility];  Service: Cardiovascular;;  . PERIPHERAL VASCULAR CATHETERIZATION Left 02/20/2015   Procedure: Pelvic Angiography;  Surgeon: Katha Cabal, MD;  Location: [facility] [place] [facility];  Service: Cardiovascular;  Laterality: Left;  . TRANSURETHRAL RESECTION OF BLADDER TUMOR WITH MITOMYCIN-C N/A 09/01/2016   Procedure: TRANSURETHRAL RESECTION OF BLADDER TUMOR WITH MITOMYCIN-C;   Surgeon: Hollice Espy, MD;  Location: [facility] OR[facility];  Service: Urology;  Laterality: N/A;    Social History Social History  Substance Use Topics  . Smoking status: Former Smoker    Packs/day: 2.00    Years: 35.00    Types: Cigarettes    Quit date: 03/30/2011  . Smokeless tobacco: Never Used  . Alcohol use No    Family History Family History  Problem Relation Age of Onset  . Cancer Mother 83       Lung Cancer  . Cancer Father 19       Lung Ca  . Diabetes Son   . Breast cancer Maternal Grandmother   . Kidney cancer Neg Hx   . Bladder Cancer Neg Hx   . Prostate  cancer Neg Hx     No Known Allergies   REVIEW OF SYSTEMS (Negative unless checked)  Constitutional: [] Weight loss  [] Fever  [] Chills Cardiac: [] Chest pain   [] Chest pressure   [] Palpitations   [] Shortness of breath when laying flat   [] Shortness of breath with exertion. Vascular:  [x] Pain in legs with walking   [] Pain in legs at rest  [] History of DVT   [] Phlebitis   [x] Swelling in legs   [] Varicose veins   [] Non-healing ulcers Pulmonary:   [] Uses home oxygen   [] Productive cough   [] Hemoptysis   [] Wheeze  [] COPD   [] Asthma Neurologic:  [] Dizziness   [] Seizures   [] History of stroke   [] History of TIA  [] Aphasia   [] Vissual changes   [] Weakness or numbness in arm   [] Weakness or numbness in leg Musculoskeletal:   [] Joint swelling   [] Joint pain   [] Low back pain Hematologic:  [] Easy bruising  [] Easy bleeding   [] Hypercoagulable state   [] Anemic Gastrointestinal:  [] Diarrhea   [] Vomiting  [] Gastroesophageal reflux/heartburn   [] Difficulty swallowing. Genitourinary:  [] Chronic kidney disease   [] Difficult urination  [] Frequent urination   [] Blood in urine Skin:  [] Rashes   [] Ulcers  Psychological:  [] History of anxiety   []  History of major depression.  Physical Examination  Vitals:   01/15/17 1440  BP: (!) 80/54  Pulse: 65  Resp: 15  Weight: 163 lb (73.9 kg)  Height: 5\' 7"  (1.702 m)   Body mass index is 25.53  kg/m. Gen: WD/WN, NAD Head: [place]/AT, No temporalis wasting.  Ear/Nose/Throat: Hearing grossly intact, nares w/o erythema or drainage Eyes: PER, EOMI, sclera nonicteric.  Neck: Supple, no large masses.   Pulmonary:  Good air movement, no audible wheezing bilaterally, no use of accessory muscles.  Cardiac: RRR, no JVD Vascular: 2+ edema bilaterally Vessel Right Left  Radial Palpable Palpable  Ulnar Palpable Palpable  Brachial Palpable Palpable  Carotid Palpable Palpable  Femoral Palpable Palpable  Popliteal Not Palpable Not Palpable  PT Not Palpable Not Palpable  DP Not Palpable Not Palpable  Gastrointestinal: Non-distended. No guarding/no peritoneal signs.  Musculoskeletal: M/S 5/5 throughout.  No deformity or atrophy.  Neurologic: CN 2-12 intact. Symmetrical.  Speech is fluent. Motor exam as listed above. Psychiatric: Judgment intact, Mood & affect appropriate for pt's clinical situation. Dermatologic: No rashes or ulcers noted.  No changes consistent with cellulitis. Lymph : No lichenification or skin changes of chronic lymphedema.  CBC Lab Results  Component Value Date   WBC 10.5 02/18/2016   HGB 12.2 09/01/2016   HCT 36.0 09/01/2016   MCV 87.0 02/18/2016   PLT 266.0 02/18/2016    BMET    Component Value Date/Time   NA 139 09/30/2016 0854   NA 139 09/30/2016 0854   NA 135 11/02/2014 0609   K 4.6 09/30/2016 0854   K 4.6 09/30/2016 0854   K 4.2 11/02/2014 0609   CL 107 09/30/2016 0854   CL 107 09/30/2016 0854   CL 107 11/02/2014 0609   CO2 27 09/30/2016 0854   CO2 27 09/30/2016 0854   CO2 23 11/02/2014 0609   GLUCOSE 168 (H) 09/30/2016 0854   GLUCOSE 168 (H) 09/30/2016 0854   GLUCOSE 108 (H) 11/02/2014 0609   BUN 33 (H) 12/09/2016 0812   BUN 20 11/02/2014 0609   CREATININE 0.99 12/09/2016 0812   CREATININE 1.01 11/09/2015 1549   CREATININE 1.01 11/09/2015 1549   CALCIUM 9.1 09/30/2016 0854   CALCIUM 9.1 09/30/2016 0854  CALCIUM 7.3 (L) 11/02/2014 0609    GFRNONAA >60 12/09/2016 0812   GFRNONAA 50 (L) 11/02/2014 0609   GFRAA >60 12/09/2016 0812   GFRAA 58 (L) 11/02/2014 0609   CrCl cannot be calculated (Patient's most recent lab result is older than the maximum 21 days allowed.).  COAG Lab Results  Component Value Date   INR 0.9 10/17/2014   INR 1.1 01/19/2014   INR 0.9 12/06/2013    Radiology No results found.   Assessment/Plan 1. Atherosclerosis of native artery of both lower extremities with intermittent claudication (HCC) Recommend:  The patient is status post successful angiogram with intervention.  The patient reports that the claudication symptoms and leg pain is essentially gone.   The patient denies lifestyle limiting changes at this point in time.  No further invasive studies, angiography or surgery at this time The patient should continue walking and begin a more formal exercise program.  The patient should continue antiplatelet therapy and aggressive treatment of the lipid abnormalities  Smoking cessation was again discussed  The patient should continue wearing graduated compression socks 10-15 mmHg strength to control the mild edema.  Patient should undergo noninvasive studies as ordered. The patient will follow up with me after the studies.   - VAS [place] ABI WITH/WO TBI; Future - VAS US AORTA/IVC/ILIACS; Future  2. Bilateral carotid artery stenosis Recommend:  Given the patient's asymptomatic subcritical stenosis no further invasive testing or surgery at this time.   Continue antiplatelet therapy as prescribed Continue management of CAD, HTN and Hyperlipidemia Healthy heart diet,  encouraged exercise at least 4 times per week Follow up in 6 months with duplex ultrasound and physical exam based on >50% stenosis of the bilateral carotid artery    3. Coronary artery abnormality Continue cardiac and antihypertensive medications as already ordered and reviewed, no changes at this time.  Continue statin as  ordered and reviewed, no changes at this time  Nitrates PRN for chest pain   4. Essential hypertension Continue antihypertensive medications as already ordered, these medications have been reviewed and there are no changes at this time.     Hortencia Pilar, MD  01/18/2017 5:15 PM

## 2017-01-28 ENCOUNTER — Encounter: Payer: Self-pay | Admitting: Internal Medicine

## 2017-01-28 ENCOUNTER — Ambulatory Visit (INDEPENDENT_AMBULATORY_CARE_PROVIDER_SITE_OTHER): Payer: Medicare Other | Admitting: Internal Medicine

## 2017-01-28 VITALS — BP 120/58 | HR 72 | Temp 98.6°F | Resp 15 | Ht 67.0 in | Wt 164.2 lb

## 2017-01-28 DIAGNOSIS — I739 Peripheral vascular disease, unspecified: Secondary | ICD-10-CM | POA: Diagnosis not present

## 2017-01-28 DIAGNOSIS — E1142 Type 2 diabetes mellitus with diabetic polyneuropathy: Secondary | ICD-10-CM

## 2017-01-28 DIAGNOSIS — I10 Essential (primary) hypertension: Secondary | ICD-10-CM

## 2017-01-28 DIAGNOSIS — E1121 Type 2 diabetes mellitus with diabetic nephropathy: Secondary | ICD-10-CM | POA: Diagnosis not present

## 2017-01-28 DIAGNOSIS — E1151 Type 2 diabetes mellitus with diabetic peripheral angiopathy without gangrene: Secondary | ICD-10-CM | POA: Diagnosis not present

## 2017-01-28 DIAGNOSIS — I70213 Atherosclerosis of native arteries of extremities with intermittent claudication, bilateral legs: Secondary | ICD-10-CM

## 2017-01-28 LAB — HEMOGLOBIN A1C: Hgb A1c MFr Bld: 7.4 % — ABNORMAL HIGH (ref 4.6–6.5)

## 2017-01-28 LAB — COMPREHENSIVE METABOLIC PANEL
ALT: 18 U/L (ref 0–35)
AST: 16 U/L (ref 0–37)
Albumin: 4 g/dL (ref 3.5–5.2)
Alkaline Phosphatase: 88 U/L (ref 39–117)
BUN: 29 mg/dL — AB (ref 6–23)
CHLORIDE: 105 meq/L (ref 96–112)
CO2: 29 mEq/L (ref 19–32)
CREATININE: 1.04 mg/dL (ref 0.40–1.20)
Calcium: 9.5 mg/dL (ref 8.4–10.5)
GFR: 57.77 mL/min — ABNORMAL LOW (ref >60.00)
GLUCOSE: 171 mg/dL — AB (ref 70–99)
Potassium: 4.7 mEq/L (ref 3.5–5.1)
SODIUM: 136 meq/L (ref 135–145)
Total Bilirubin: 0.4 mg/dL (ref 0.2–1.2)
Total Protein: 7 g/dL (ref 6.0–8.3)

## 2017-01-28 LAB — LDL CHOLESTEROL, DIRECT: Direct LDL: 47 mg/dL

## 2017-01-28 NOTE — Progress Notes (Signed)
Subjective:  Patient ID: Laura Reed, female    DOB: 20-Apr-1961  Age: 56 y.o. MRN: [id number]  CC: The primary encounter diagnosis was Controlled type 2 DM with peripheral circulatory disorder ([place]). Diagnoses of Essential hypertension, PVD (peripheral vascular disease) ([place]), Diabetic nephropathy with proteinuria ([place]), Diabetic peripheral neuropathy associated with type 2 diabetes mellitus ([place]), and Atherosclerosis of native artery of both lower extremities with intermittent claudication ([place]) were also pertinent to this visit.  HPI ADREONNA YONTZ presents for 6 month follow up on diabetes.  Patient has no complaints today.  Patient is following a low glycemic index diet  Most of the time and  taking all prescribed medications regularly without side effects.  Fasting sugars have been under less than 140 most of the time and post prandials have been under 160 except on rare occasions highest was 270 after eating baked ziti. . Patient is  exercising 3 times  per week and   intentionally trying to lose weight .  Patient has had an eye exam in the last 12 months and checks feet regularly for signs of infection.  Patient does not walk barefoot outside,  Due to history of diabetic neuropathy and PAD.  She has had a recent vascular procedure for PAD surveillance by [employer].   She is taking 30 units of of humulin  in am and 36 units  in evening.   30 levemir at night   Lab Results  Component Value Date   HGBA1C 7.4 (H) 01/28/2017   Fostering several kittens for the [employer] . Some scratches but no infection s.  ABIs have improved per` her recent procedure one month ago   Outpatient Medications Prior to Visit  Medication Sig Dispense Refill  . ACCU-CHEK AVIVA PLUS test strip CHECK BLOOD SUGAR TWO TO THREE TIMES DAILY 100 each 2  . acetaminophen (TYLENOL) 500 MG tablet Take 1,000 mg by mouth every 6 (six) hours as needed for mild pain or headache.     . AMITIZA 24 MCG capsule TAKE ONE  CAPSULE BY MOUTH TWICE DAILY WITH A MEAL 60 capsule 1  . amLODipine (NORVASC) 5 MG tablet Take 1 tablet (5 mg total) by mouth daily. 90 tablet 3  . aspirin 81 MG chewable tablet Chew 81 mg by mouth daily.    Marland Kitchen atorvastatin (LIPITOR) 20 MG tablet TAKE 1 TABLET BY MOUTH EVERY DAY 90 tablet 3  . B-D ULTRAFINE III SHORT PEN 31G X 8 MM MISC TEST BLOOD SUGAR TWICE DAILY 100 each 0  . B-D ULTRAFINE III SHORT PEN 31G X 8 MM MISC TEST BLOOD SUGAR TWICE DAILY 100 each 0  . Cholecalciferol (VITAMIN D3) 2000 UNITS TABS Take 2,000 Units by mouth daily after supper.     . clopidogrel (PLAVIX) 75 MG tablet TAKE 1 TABLET BY MOUTH EVERY DAY 90 tablet 0  . DIGOX 125 MCG tablet TAKE 1 TABLET BY MOUTH EVERY DAY (Patient taking differently: TAKE 1 TABLET BY MOUTH EVERY DAY IN THE EVENING) 90 tablet 3  . diphenhydrAMINE (BENADRYL) 25 mg capsule Take 25 mg by mouth at bedtime as needed for sleep.    Marland Kitchen gabapentin (NEURONTIN) 600 MG tablet TAKE 1 TABLET BY MOUTH THREE TIMES DAILY (Patient taking differently: TAKE 1 TABLET BY MOUTH THREE TIMES DAILY AS NEEDED FOR  RLS) 270 tablet 0  . HUMALOG MIX 75/25 KWIKPEN (75-25) 100 UNIT/ML Kwikpen INJECT 30 UNITS UNDER THE SKIN EVERY MORNING AND 36 UNITS EVERY EVENING 30 mL 3  .  Lactulose 20 GM/30ML SOLN 30 ml every 4 hours until constipation is relieved (Patient taking differently: Take 30 mLs by mouth See admin instructions. 30 ml every 4 hours until constipation is relieved) 236 mL 3  . LEVEMIR FLEXTOUCH 100 UNIT/ML Pen INJECT 40 UNITS UNDER THE SKIN EVERY DAY AT 10 PM 15 mL 0  . LORazepam (ATIVAN) 0.5 MG tablet TAKE 1 TABLET BY MOUTH AT BEDTIME, MAY REPEAT IN 1 HOUR IF NEEDED (Patient taking differently: TAKE 1 TABLET BY MOUTH AT BEDTIME, MAY REPEAT IN 1 HOUR IF NEEDED FOR SLEEP) 60 tablet 0  . mometasone (ELOCON) 0.1 % lotion Apply 1-2 application topically daily as needed (into ear canal for ear irriation).     . TRUEPLUS LANCETS 28G MISC CHECK BLOOD SUGAR FOUR TIMES DAILY 200  each 0  . benzonatate (TESSALON) 100 MG capsule Take 1 capsule (100 mg total) by mouth 2 (two) times daily as needed for cough. (Patient not taking: Reported on 01/28/2017) 20 capsule 0  . HYDROcodone-acetaminophen (NORCO/VICODIN) 5-325 MG tablet Take 1-2 tablets by mouth every 6 (six) hours as needed for moderate pain. (Patient not taking: Reported on 01/28/2017) 10 tablet 0   Facility-Administered Medications Prior to Visit  Medication Dose Route Frequency Provider Last Rate Last Dose  . lidocaine-EPINEPHrine (XYLOCAINE W/EPI) 2 %-1:100000 (with pres) injection 1.7 mL  1.7 mL Intradermal Once [name], MD        Review of Systems;  Patient denies headache, fevers, malaise, unintentional weight loss, skin rash, eye pain, sinus congestion and sinus pain, sore throat, dysphagia,  hemoptysis , cough, dyspnea, wheezing, chest pain, palpitations, orthopnea, edema, abdominal pain, nausea, melena, diarrhea, constipation, flank pain, dysuria, hematuria, urinary  Frequency, nocturia, numbness, tingling, seizures,  Focal weakness, Loss of consciousness,  Tremor, insomnia, depression, anxiety, and suicidal ideation.      Objective:  BP (!) 120/58 (BP Location: Left Arm, Patient Position: Sitting, Cuff Size: Normal)   Pulse 72   Temp 98.6 F (37 C) (Oral)   Resp 15   Ht 5\' 7"  (1.702 m)   Wt 164 lb 3.2 oz (74.5 kg)   SpO2 97%   BMI 25.72 kg/m   BP Readings from Last 3 Encounters:  01/28/17 (!) 120/58  01/15/17 (!) 80/54  12/10/16 (!) 142/56    Wt Readings from Last 3 Encounters:  01/28/17 164 lb 3.2 oz (74.5 kg)  01/15/17 163 lb (73.9 kg)  12/10/16 162 lb (73.5 kg)    General appearance: alert, cooperative and appears stated age Ears: normal TM's and external ear canals both ears Throat: lips, mucosa, and tongue normal; teeth and gums normal Neck: no adenopathy, no carotid bruit, supple, symmetrical, trachea midline and thyroid not enlarged, symmetric, no  tenderness/mass/nodules Back: symmetric, no curvature. ROM normal. No CVA tenderness. Lungs: clear to auscultation bilaterally Heart: regular rate and rhythm, S1, S2 normal, no murmur, click, rub or gallop Abdomen: soft, non-tender; bowel sounds normal; no masses,  no organomegaly Pulses: 2+ and symmetric Skin: Skin color, texture, turgor normal. No rashes or lesions Lymph nodes: Cervical, supraclavicular, and axillary nodes normal.  Lab Results  Component Value Date   HGBA1C 7.4 (H) 01/28/2017   HGBA1C 7.5 (H) 09/30/2016   HGBA1C 6.8 (H) 05/21/2016    Lab Results  Component Value Date   CREATININE 1.04 01/28/2017   CREATININE 0.99 12/09/2016   CREATININE 1.05 09/30/2016   CREATININE 1.05 09/30/2016    Lab Results  Component Value Date   WBC 10.5 02/18/2016  HGB 12.2 09/01/2016   HCT 36.0 09/01/2016   PLT 266.0 02/18/2016   GLUCOSE 171 (H) 01/28/2017   CHOL 100 09/30/2016   TRIG 108.0 09/30/2016   HDL 37.80 (L) 09/30/2016   LDLDIRECT 47.0 01/28/2017   LDLCALC 41 09/30/2016   ALT 18 01/28/2017   AST 16 01/28/2017   NA 136 01/28/2017   K 4.7 01/28/2017   CL 105 01/28/2017   CREATININE 1.04 01/28/2017   BUN 29 (H) 01/28/2017   CO2 29 01/28/2017   TSH 0.96 05/18/2015   INR 0.9 10/17/2014   HGBA1C 7.4 (H) 01/28/2017   MICROALBUR 8.3 (H) 09/30/2016    No results found.  Assessment & Plan:   Problem List Items Addressed This Visit    Atherosclerosis of native arteries of extremity with intermittent claudication [facility])    She has had multiple interventions and is seen regularly by Dr Delana Meyer for maintenance of patent left iliac bypass.  ABIS have recently improved and are both > 0.9  Carotid ultrasoudns have been done .        Controlled type 2 DM with peripheral circulatory disorder ([place]) - Primary    Currently under reasonable control given prior  Episode of life threatening hypoglycemia.es . Patient is  Up to date on  annual eye exams and foot exam is unchanged  and  she sees podiatry regularly . Patient has minimal microalbuminuria. Patient is tolerating statin therapy for CAD risk reduction and on ACE for renal protection and hypertension .  Lab Results  Component Value Date   HGBA1C 7.4 (H) 01/28/2017   Lab Results  Component Value Date   MICROALBUR 8.3 (H) 09/30/2016         Relevant Orders   Hemoglobin A1c (Completed)   Diabetic nephropathy with proteinuria (HCC)    Managed with Lisinopril 20 mg daily .  She is s/p nephrectomy for renal cell carcinoma.  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 She is wearing pool shoes when she swims and works out and has frequent follow up with Dr Cleda Mccreedy.   Lab Results  Component Value Date   HGBA1C 7.4 (H) 01/28/2017   Lab Results  Component Value Date   MICROALBUR 8.3 (H) 09/30/2016         Hypertension   Relevant Orders   Comprehensive metabolic panel (Completed)   PVD (peripheral vascular disease) ([place])   Relevant Orders   LDL cholesterol, direct (Completed)      I have discontinued Ms. Bussie's benzonatate and HYDROcodone-acetaminophen. I am also having her maintain her Vitamin D3, aspirin, Lactulose, HUMALOG MIX 75/25 KWIKPEN, B-D ULTRAFINE III SHORT PEN, TRUEPLUS LANCETS 28G, LORazepam, mometasone, DIGOX, acetaminophen, amLODipine, AMITIZA, atorvastatin, ACCU-CHEK AVIVA PLUS, gabapentin, diphenhydrAMINE, B-D ULTRAFINE III SHORT PEN, clopidogrel, and LEVEMIR FLEXTOUCH. We will continue to administer lidocaine-EPINEPHrine.  No orders of the defined types were placed in this encounter.   Medications Discontinued During This Encounter  Medication Reason  . benzonatate (TESSALON) 100 MG capsule Therapy completed  .  HYDROcodone-acetaminophen (NORCO/VICODIN) 5-325 MG tablet Patient has not taken in last 30 days    Follow-up: Return in about 3 months (around 04/30/2017) for follow up diabetes.   Jerrold Haskell L,  MD

## 2017-01-30 ENCOUNTER — Encounter: Payer: Self-pay | Admitting: Internal Medicine

## 2017-01-30 NOTE — Assessment & Plan Note (Addendum)
She has had multiple interventions and is seen regularly by Dr Delana Meyer for maintenance of patent left iliac bypass.  ABIS have recently improved and are both > 0.9  Carotid ultrasoudns have been done .

## 2017-01-30 NOTE — Assessment & Plan Note (Signed)
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 She is wearing pool shoes when she swims and works out and has frequent follow up with Dr Cleda Mccreedy.   Lab Results  Component Value Date   HGBA1C 7.4 (H) 01/28/2017   Lab Results  Component Value Date   MICROALBUR 8.3 (H) 09/30/2016

## 2017-01-30 NOTE — Assessment & Plan Note (Signed)
Managed with Lisinopril 20 mg daily .  She is s/p nephrectomy for renal cell carcinoma.  rnal function is normal     Lab Results  Component Value Date   MICROALBUR 8.3 (H) 09/30/2016   Lab Results  Component Value Date   CREATININE 1.04 01/28/2017

## 2017-01-30 NOTE — Assessment & Plan Note (Signed)
Currently under reasonable control given prior  Episode of life threatening hypoglycemia.es . Patient is  Up to date on  annual eye exams and foot exam is unchanged and  she sees podiatry regularly . Patient has minimal microalbuminuria. Patient is tolerating statin therapy for CAD risk reduction and on ACE for renal protection and hypertension .  Lab Results  Component Value Date   HGBA1C 7.4 (H) 01/28/2017   Lab Results  Component Value Date   MICROALBUR 8.3 (H) 09/30/2016

## 2017-01-31 ENCOUNTER — Other Ambulatory Visit: Payer: Self-pay | Admitting: Internal Medicine

## 2017-02-02 DIAGNOSIS — L97521 Non-pressure chronic ulcer of other part of left foot limited to breakdown of skin: Secondary | ICD-10-CM | POA: Diagnosis not present

## 2017-02-03 ENCOUNTER — Other Ambulatory Visit: Payer: Self-pay | Admitting: Internal Medicine

## 2017-02-10 ENCOUNTER — Other Ambulatory Visit: Payer: Self-pay | Admitting: Internal Medicine

## 2017-02-11 ENCOUNTER — Other Ambulatory Visit: Payer: Medicare Other | Admitting: Urology

## 2017-02-20 ENCOUNTER — Telehealth: Payer: Self-pay | Admitting: Internal Medicine

## 2017-02-20 NOTE — Telephone Encounter (Signed)
Left pt message asking to call Ebony Hail back directly at [phone] to schedule AWV. Thanks!  *NOTE* Never had AWV before/started medicare in 2012

## 2017-02-24 ENCOUNTER — Other Ambulatory Visit: Payer: Self-pay | Admitting: Internal Medicine

## 2017-03-03 ENCOUNTER — Ambulatory Visit (INDEPENDENT_AMBULATORY_CARE_PROVIDER_SITE_OTHER): Payer: Medicare Other | Admitting: Urology

## 2017-03-03 ENCOUNTER — Encounter: Payer: Self-pay | Admitting: Urology

## 2017-03-03 VITALS — BP 101/57 | HR 71 | Ht 67.0 in | Wt 164.0 lb

## 2017-03-03 DIAGNOSIS — C679 Malignant neoplasm of bladder, unspecified: Secondary | ICD-10-CM

## 2017-03-03 DIAGNOSIS — C642 Malignant neoplasm of left kidney, except renal pelvis: Secondary | ICD-10-CM

## 2017-03-03 DIAGNOSIS — N3000 Acute cystitis without hematuria: Secondary | ICD-10-CM | POA: Diagnosis not present

## 2017-03-03 LAB — URINALYSIS, COMPLETE
BILIRUBIN UA: NEGATIVE
GLUCOSE, UA: NEGATIVE
Nitrite, UA: POSITIVE — AB
RBC, UA: NEGATIVE
Specific Gravity, UA: 1.015 (ref 1.005–1.030)
Urobilinogen, Ur: 0.2 mg/dL (ref 0.2–1.0)
pH, UA: 5.5 (ref 5.0–7.5)

## 2017-03-03 LAB — MICROSCOPIC EXAMINATION: RBC MICROSCOPIC, UA: NONE SEEN /HPF (ref 0–?)

## 2017-03-03 MED ORDER — LIDOCAINE HCL 2 % EX GEL
1.0000 "application " | Freq: Once | CUTANEOUS | Status: AC
  Administered 2017-03-03: 1 via URETHRAL

## 2017-03-03 MED ORDER — CIPROFLOXACIN HCL 500 MG PO TABS
500.0000 mg | ORAL_TABLET | Freq: Once | ORAL | Status: AC
  Administered 2017-03-03: 500 mg via ORAL

## 2017-03-03 MED ORDER — SULFAMETHOXAZOLE-TRIMETHOPRIM 800-160 MG PO TABS: 1.0000 | ORAL_TABLET | Freq: Two times a day (BID) | ORAL | 0 refills | Status: DC

## 2017-03-03 NOTE — Progress Notes (Signed)
03/03/2017 5:10 PM   Laura Reed 1960/08/30 [id number]  Referring provider: [name], MD [address], [place] [address]  [id number]  HPI:  56 yo F with with history of pT1N0 left upper tract urothelial carcinoma s/p nephroU 4/016 with low grade Ta bladder recurrence s/p TURBT, mitomycin, right retrograde pyelogram on 2/18.  She returns today for cystoscopy, surveillance however her UA is frankly positive. She does admit today that she has had increased urinary frequency, urgency, and sensation of incomplete bladder emptying and discomfort after she voids. This is going on a week or 2. No fevers or chills. No gross hematuria.   1 - Left Upper Tract Transitional Cell Carcinoma/ Bladder cancer recurrence - s/p LEFT robotic nephroureterectomy on 10/31/2014 as well as combined ventral hernia repair with Dr. Bary Castilla. Post complicated by wound infection now well healed. Pathology  pT1N0 with negative margins.  Recent Surveillance: 02/2015 - cysto NED; 05/2015 cysto NED, 08/2015- cysto NED, 11/2015- NED, CT Urogram 11/12/15 negative, poor quality of delayed phase.; 5/2-17- cysto with mild erythema following recent UTI; 02/2016 NED; 08/2016 Lg Ta TCC recurrence,  1 cm, multifocal.  RTG neg.  08/2016- TURBT for bladder 1 cm recurrence near bladder neck and left UO, multifocal.  R RTG negative. Mitomycin.  LgTa. 10/2016- Induction BCG x 6.    2 - Solitary Kidney  - s/p left nephrecotmy as per above. Most recent Cr 1.04 01/2017.   She does have an extensive smoking history, quit 5 years ago but smoked up to 2 packs a day for 35 years. She also has multiple medical comorbidities including history of diabetes, CAD status post CABG, carotid endarterectomy.      PMH: Past Medical History:  Diagnosis Date  . Anxiety   . Bladder cancer ([place])   . Cancer ([place])   . CHF (congestive heart failure) ([place])   . Coronary artery disease   . Diabetes mellitus   . Heart murmur     . Hemorrhoid   . Hypertension   . Neuropathy   . PVD (peripheral vascular disease) ([place])   . Urothelial carcinoma of kidney ([place]) 10/31/2014   INVASIVE UROTHELIAL CARCINOMA, LOW GRADE. T1, Nx.    Surgical History: Past Surgical History:  Procedure Laterality Date  . AMPUTATION TOE    . ARTERIAL BYPASS SURGRY  2009, 2013 x 2   right leg , done in [place]  . CARDIAC CATHETERIZATION    . CAROTID ENDARTERECTOMY Right 01/2014   Dr Delana Meyer  . CATARACT EXTRACTION W/PHACO Right 12/14/2014   Procedure: CATARACT EXTRACTION PHACO AND INTRAOCULAR LENS PLACEMENT (IOC);  Surgeon: Lyla Glassing, MD;  Location: [facility] ORS;  Service: Ophthalmology;  Laterality: Right;  [place]   00:38.6              AP        7.1                   CDE  2.76  . CESAREAN SECTION    . CHOLECYSTECTOMY  03-03-12   Porcelain gallbladder, gallstones,  [place]  . COLONOSCOPY W/ BIOPSIES  04/28/2012   Hyperplastic rectal polyps.  . CORONARY ARTERY BYPASS GRAFT  2009   3 vessel  . CYSTOSCOPY W/ RETROGRADES Right 09/01/2016   Procedure: CYSTOSCOPY WITH RETROGRADE PYELOGRAM;  Surgeon: Hollice Espy, MD;  Location: [facility] ORS;  Service: Urology;  Laterality: Right;  . HERNIA REPAIR  10-31-14   ventral, retro-rectus atrium mesh  . LOWER EXTREMITY ANGIOGRAPHY  Left 12/10/2016   Procedure: Lower Extremity Angiography;  Surgeon: Katha Cabal, MD;  Location: [place] [facility];  Service: Cardiovascular;  Laterality: Left;  . NEPHRECTOMY Left 10-31-14  . PERIPHERAL VASCULAR CATHETERIZATION Left 05/01/2015   Procedure: Lower Extremity Angiography;  Surgeon: Katha Cabal, MD;  Location: [place] [facility];  Service: Cardiovascular;  Laterality: Left;  . PERIPHERAL VASCULAR CATHETERIZATION  05/01/2015   Procedure: Lower Extremity Intervention;  Surgeon: Katha Cabal, MD;  Location: [place] [facility];  Service: Cardiovascular;;  . PERIPHERAL VASCULAR CATHETERIZATION Left 02/20/2015   Procedure: Pelvic Angiography;  Surgeon:  Katha Cabal, MD;  Location: [place] [facility];  Service: Cardiovascular;  Laterality: Left;  . TRANSURETHRAL RESECTION OF BLADDER TUMOR WITH MITOMYCIN-C N/A 09/01/2016   Procedure: TRANSURETHRAL RESECTION OF BLADDER TUMOR WITH MITOMYCIN-C;  Surgeon: Hollice Espy, MD;  Location: [facility] ORS;  Service: Urology;  Laterality: N/A;    Home Medications:  Allergies as of 03/03/2017   No Known Allergies     Medication List       Accurate as of 03/03/17  5:10 PM. Always use your most recent med list.          ACCU-CHEK AVIVA PLUS test strip Generic drug:  glucose blood USE TO CHECK BLOOD SUGAR 2 TO 3 TIMES DAILY   acetaminophen 500 MG tablet Commonly known as:  TYLENOL Take 1,000 mg by mouth every 6 (six) hours as needed for mild pain or headache.   AMITIZA 24 MCG capsule Generic drug:  lubiprostone TAKE ONE CAPSULE BY MOUTH TWICE DAILY WITH A MEAL   amLODipine 5 MG tablet Commonly known as:  NORVASC Take 1 tablet (5 mg total) by mouth daily.   aspirin 81 MG chewable tablet Chew 81 mg by mouth daily.   atorvastatin 20 MG tablet Commonly known as:  LIPITOR TAKE 1 TABLET BY MOUTH EVERY DAY   B-D ULTRAFINE III SHORT PEN 31G X 8 MM Misc Generic drug:  Insulin Pen Needle TEST BLOOD SUGAR TWICE DAILY   B-D ULTRAFINE III SHORT PEN 31G X 8 MM Misc Generic drug:  Insulin Pen Needle TEST BLOOD SUGAR TWICE DAILY   clopidogrel 75 MG tablet Commonly known as:  PLAVIX TAKE 1 TABLET BY MOUTH EVERY DAY   DIGOX 0.125 MG tablet Generic drug:  digoxin TAKE 1 TABLET BY MOUTH EVERY DAY   diphenhydrAMINE 25 mg capsule Commonly known as:  BENADRYL Take 25 mg by mouth at bedtime as needed for sleep.   gabapentin 600 MG tablet Commonly known as:  NEURONTIN TAKE 1 TABLET BY MOUTH THREE TIMES DAILY   HUMALOG MIX 75/25 KWIKPEN (75-25) 100 UNIT/ML Kwikpen Generic drug:  Insulin Lispro Prot & Lispro INJECT 30 UNITS UNDER THE SKIN EVERY MORNING AND 36 UNITS EVERY EVENING   Lactulose  20 GM/30ML Soln 30 ml every 4 hours until constipation is relieved   LEVEMIR FLEXTOUCH 100 UNIT/ML Pen Generic drug:  Insulin Detemir INJECT 40 UNITS UNDER THE SKIN EVERY DAY AT 10 PM   LORazepam 0.5 MG tablet Commonly known as:  ATIVAN TAKE 1 TABLET BY MOUTH AT BEDTIME. MAY REPEAT IN 1 HOUR IF NEEDED   mometasone 0.1 % lotion Commonly known as:  ELOCON Apply 1-2 application topically daily as needed (into ear canal for ear irriation).   sulfamethoxazole-trimethoprim 800-160 MG tablet Commonly known as:  BACTRIM DS,SEPTRA DS Take 1 tablet by mouth every 12 (twelve) hours.   TRUEPLUS LANCETS 28G Misc CHECK BLOOD SUGAR FOUR TIMES DAILY  Vitamin D3 2000 units Tabs Take 2,000 Units by mouth daily after supper.            Discharge Care Instructions        Start     Ordered   03/03/17 1515  CIPROFLOXACIN HCL 500 MG PO TABS   Once     03/03/17 1513   03/03/17 1515  LIDOCAINE HCL 2 % EX GEL   Once     03/03/17 1513   03/03/17 0000  Urinalysis, Complete     03/03/17 1513   03/03/17 0000  CULTURE, URINE COMPREHENSIVE     03/03/17 1531   03/03/17 0000  sulfamethoxazole-trimethoprim (BACTRIM DS,SEPTRA DS) 800-160 MG tablet  Every 12 hours     03/03/17 1544   03/03/17 0000  Microscopic Examination     03/03/17 0000      Allergies: No Known Allergies  Family History: Family History  Problem Relation Age of Onset  . Cancer Mother 30       Lung Cancer  . Cancer Father 39       Lung Ca  . Diabetes Son   . Breast cancer Maternal Grandmother   . Kidney cancer Neg Hx   . Bladder Cancer Neg Hx   . Prostate cancer Neg Hx     Social History:  reports that she quit smoking about 5 years ago. Her smoking use included Cigarettes. She has a 70.00 pack-year smoking history. She has never used smokeless tobacco. She reports that she does not drink alcohol or use drugs.  ROS: 12 point review systems negative other than as per history of present illness.  Physical Exam: BP  (!) 101/57 (BP Location: Right Arm, Patient Position: Sitting, Cuff Size: Normal)   Pulse 71   Ht 5\' 7"  (1.702 m)   Wt 164 lb (74.4 kg)   BMI 25.69 kg/m   Constitutional:  Alert and oriented, No acute distress.   HEENT: [place] AT, moist mucus membranes.  Trachea midline, no masses. Cardiovascular: No clubbing, cyanosis, or edema. Respiratory: Normal respiratory effort, no increased work of breathing. GI: Abdomen is soft, nontender, nondistended, no abdominal masses Skin: No rashes, bruises or suspicious lesions. Neurologic: Grossly intact, no focal deficits, moving all 4 extremities. Psychiatric: Normal mood and affect.  Laboratory Data: Lab Results  Component Value Date   WBC 10.5 02/18/2016   HGB 12.2 09/01/2016   HCT 36.0 09/01/2016   MCV 87.0 02/18/2016   PLT 266.0 02/18/2016    Lab Results  Component Value Date   CREATININE 1.04 01/28/2017    Lab Results  Component Value Date   HGBA1C 7.4 (H) 01/28/2017    Urinalysis Results for orders placed or performed in visit on 03/03/17  Microscopic Examination  Result Value Ref Range   WBC, UA >30 (H) 0 - 5 /hpf   RBC, UA None seen 0 - 2 /hpf   Epithelial Cells (non renal) 0-10 0 - 10 /hpf   Mucus, UA Present (A) Not Estab.   Bacteria, UA Many (A) None seen/Few  Urinalysis, Complete  Result Value Ref Range   Specific Gravity, UA 1.015 1.005 - 1.030   pH, UA 5.5 5.0 - 7.5   Color, UA Yellow Yellow   Appearance Ur Cloudy (A) Clear   Leukocytes, UA 1+ (A) Negative   Protein, UA 1+ (A) Negative/Trace   Glucose, UA Negative Negative   Ketones, UA Trace (A) Negative   RBC, UA Negative Negative   Bilirubin, UA Negative Negative  Urobilinogen, Ur 0.2 0.2 - 1.0 mg/dL   Nitrite, UA Positive (A) Negative   Microscopic Examination See below:     Pertinent Imaging: Most recent cross-sectional imaging the form of CT abdomen with and without contrast on 11/12/2015, retrograde pyelogram 08/2016   Assessment & Plan:    1.  Malignant neoplasm of urinary bladder neck (HCC) Due for cysto, but will reschedule in light of UTI  2. Urothelial carcinoma of kidney, left (HCC) Cysto q 3 months as above Annual upper tract imaging   3. Solitary kidney Solitary kidney precautions reviewed again today  4. Acute cystitis Symptomatic UTI We'll treat with Bactrim DS twice a day, a chest as needed based on urine culture today  Reschedule cysto for in 3 weeks  Hollice Espy, MD  [address] [place], [place] [address] [phone]

## 2017-03-06 LAB — CULTURE, URINE COMPREHENSIVE

## 2017-03-10 ENCOUNTER — Other Ambulatory Visit: Payer: Self-pay | Admitting: Internal Medicine

## 2017-03-10 DIAGNOSIS — I25810 Atherosclerosis of coronary artery bypass graft(s) without angina pectoris: Secondary | ICD-10-CM | POA: Diagnosis not present

## 2017-03-10 DIAGNOSIS — I739 Peripheral vascular disease, unspecified: Secondary | ICD-10-CM | POA: Diagnosis not present

## 2017-03-10 DIAGNOSIS — E782 Mixed hyperlipidemia: Secondary | ICD-10-CM | POA: Diagnosis not present

## 2017-03-12 ENCOUNTER — Other Ambulatory Visit: Payer: Self-pay | Admitting: Internal Medicine

## 2017-03-12 ENCOUNTER — Other Ambulatory Visit: Payer: Self-pay

## 2017-03-16 DIAGNOSIS — H109 Unspecified conjunctivitis: Secondary | ICD-10-CM | POA: Diagnosis not present

## 2017-03-24 ENCOUNTER — Other Ambulatory Visit: Payer: Medicare Other | Admitting: Urology

## 2017-03-25 ENCOUNTER — Other Ambulatory Visit: Payer: Self-pay | Admitting: Internal Medicine

## 2017-03-25 DIAGNOSIS — H109 Unspecified conjunctivitis: Secondary | ICD-10-CM | POA: Diagnosis not present

## 2017-03-25 NOTE — Telephone Encounter (Signed)
Scheduled 03/27/17

## 2017-03-27 ENCOUNTER — Ambulatory Visit (INDEPENDENT_AMBULATORY_CARE_PROVIDER_SITE_OTHER): Payer: Medicare Other

## 2017-03-27 VITALS — BP 138/70 | HR 75 | Temp 98.5°F | Resp 14 | Ht 67.0 in | Wt 163.8 lb

## 2017-03-27 DIAGNOSIS — Z0000 Encounter for general adult medical examination without abnormal findings: Secondary | ICD-10-CM | POA: Diagnosis not present

## 2017-03-27 DIAGNOSIS — Z23 Encounter for immunization: Secondary | ICD-10-CM | POA: Diagnosis not present

## 2017-03-27 NOTE — Progress Notes (Signed)
Subjective:   Laura Reed is a 56 y.o. female who presents for an Initial Medicare Annual Wellness Visit.  Review of Systems    No ROS.  Medicare Wellness Visit. Additional risk factors are reflected in the social history.  Cardiac Risk Factors include: diabetes mellitus;hypertension     Objective:    Today's Vitals   03/27/17 1000  BP: 138/70  Pulse: 75  Resp: 14  Temp: 98.5 F (36.9 C)  TempSrc: Oral  SpO2: 98%  Weight: 163 lb 12.8 oz (74.3 kg)  Height: 5\' 7"  (1.702 m)   Body mass index is 25.65 kg/m.   Current Medications (verified) Outpatient Encounter Prescriptions as of 03/27/2017  Medication Sig  . ACCU-CHEK AVIVA PLUS test strip TEST BLOOD SUGAR 2 TO 3 TIMES DAILY  . acetaminophen (TYLENOL) 500 MG tablet Take 1,000 mg by mouth every 6 (six) hours as needed for mild pain or headache.   . AMITIZA 24 MCG capsule TAKE ONE CAPSULE BY MOUTH TWICE DAILY WITH A MEAL  . amLODipine (NORVASC) 5 MG tablet Take 1 tablet (5 mg total) by mouth daily.  Marland Kitchen aspirin 81 MG chewable tablet Chew 81 mg by mouth daily.  Marland Kitchen atorvastatin (LIPITOR) 20 MG tablet TAKE 1 TABLET BY MOUTH EVERY DAY  . B-D ULTRAFINE III SHORT PEN 31G X 8 MM MISC TEST BLOOD SUGAR TWICE DAILY  . B-D ULTRAFINE III SHORT PEN 31G X 8 MM MISC TEST BLOOD SUGAR TWICE DAILY  . Cholecalciferol (VITAMIN D3) 2000 UNITS TABS Take 2,000 Units by mouth daily after supper.   . clopidogrel (PLAVIX) 75 MG tablet TAKE 1 TABLET BY MOUTH EVERY DAY  . DIGOX 125 MCG tablet TAKE 1 TABLET BY MOUTH EVERY DAY (Patient taking differently: TAKE 1 TABLET BY MOUTH EVERY DAY IN THE EVENING)  . diphenhydrAMINE (BENADRYL) 25 mg capsule Take 25 mg by mouth at bedtime as needed for sleep.  Marland Kitchen gabapentin (NEURONTIN) 600 MG tablet TAKE 1 TABLET BY MOUTH THREE TIMES DAILY  . HUMALOG MIX 75/25 KWIKPEN (75-25) 100 UNIT/ML Kwikpen INJECT 30 UNITS UNDER THE SKIN EVERY MORNING AND 36 UNITS EVERY EVENING  . Lactulose 20 GM/30ML SOLN 30 ml every 4 hours  until constipation is relieved (Patient taking differently: Take 30 mLs by mouth See admin instructions. 30 ml every 4 hours until constipation is relieved)  . LEVEMIR FLEXTOUCH 100 UNIT/ML Pen INJECT 40 UNITS UNDER THE SKIN EVERY DAY AT 10 PM  . LORazepam (ATIVAN) 0.5 MG tablet TAKE 1 TABLET BY MOUTH AT BEDTIME. MAY REPEAT IN 1 HOUR IF NEEDED  . mometasone (ELOCON) 0.1 % lotion Apply 1-2 application topically daily as needed (into ear canal for ear irriation).   . neomycin-polymyxin b-dexamethasone (MAXITROL) 3.5-10000-0.1 SUSP SHAKE LQ AND INT 1 GTT IN OD TID FOR 7 DAYS  . sulfamethoxazole-trimethoprim (BACTRIM DS,SEPTRA DS) 800-160 MG tablet Take 1 tablet by mouth every 12 (twelve) hours.  . TRUEPLUS LANCETS 28G MISC CHECK BLOOD SUGAR FOUR TIMES DAILY   Facility-Administered Encounter Medications as of 03/27/2017  Medication  . lidocaine-EPINEPHrine (XYLOCAINE W/EPI) 2 %-1:100000 (with pres) injection 1.7 mL    Allergies (verified) Patient has no known allergies.   History: Past Medical History:  Diagnosis Date  . Anxiety   . Bladder cancer ([place])   . Cancer ([place])   . CHF (congestive heart failure) ([place])   . Coronary artery disease   . Diabetes mellitus   . Heart murmur   . Hemorrhoid   . Hypertension   .  Neuropathy   . PVD (peripheral vascular disease) ([place])   . Urothelial carcinoma of kidney ([place]) 10/31/2014   INVASIVE UROTHELIAL CARCINOMA, LOW GRADE. T1, Nx.   Past Surgical History:  Procedure Laterality Date  . AMPUTATION TOE    . ARTERIAL BYPASS SURGRY  2009, 2013 x 2   right leg , done in [place]  . CARDIAC CATHETERIZATION    . CAROTID ENDARTERECTOMY Right 01/2014   Dr Delana Meyer  . CATARACT EXTRACTION W/PHACO Right 12/14/2014   Procedure: CATARACT EXTRACTION PHACO AND INTRAOCULAR LENS PLACEMENT (IOC);  Surgeon: Lyla Glassing, MD;  Location: [facility] ORS;  Service: Ophthalmology;  Laterality: Right;  [place]   00:38.6              AP        7.1                   CDE  2.76  .  CESAREAN SECTION    . CHOLECYSTECTOMY  03-03-12   Porcelain gallbladder, gallstones,  [place]  . COLONOSCOPY W/ BIOPSIES  04/28/2012   Hyperplastic rectal polyps.  . CORONARY ARTERY BYPASS GRAFT  2009   3 vessel  . CYSTOSCOPY W/ RETROGRADES Right 09/01/2016   Procedure: CYSTOSCOPY WITH RETROGRADE PYELOGRAM;  Surgeon: Hollice Espy, MD;  Location: [facility];  Service: Urology;  Laterality: Right;  . HERNIA REPAIR  10-31-14   ventral, retro-rectus atrium mesh  . LOWER EXTREMITY ANGIOGRAPHY Left 12/10/2016   Procedure: Lower Extremity Angiography;  Surgeon: Katha Cabal, MD;  Location: [place] [facility];  Service: Cardiovascular;  Laterality: Left;  . NEPHRECTOMY Left 10-31-14  . PERIPHERAL VASCULAR CATHETERIZATION Left 05/01/2015   Procedure: Lower Extremity Angiography;  Surgeon: Katha Cabal, MD;  Location: [place] [facility];  Service: Cardiovascular;  Laterality: Left;  . PERIPHERAL VASCULAR CATHETERIZATION  05/01/2015   Procedure: Lower Extremity Intervention;  Surgeon: Katha Cabal, MD;  Location: [place] [facility];  Service: Cardiovascular;;  . PERIPHERAL VASCULAR CATHETERIZATION Left 02/20/2015   Procedure: Pelvic Angiography;  Surgeon: Katha Cabal, MD;  Location: [place] [facility];  Service: Cardiovascular;  Laterality: Left;  . TRANSURETHRAL RESECTION OF BLADDER TUMOR WITH MITOMYCIN-C N/A 09/01/2016   Procedure: TRANSURETHRAL RESECTION OF BLADDER TUMOR WITH MITOMYCIN-C;  Surgeon: Hollice Espy, MD;  Location: [facility];  Service: Urology;  Laterality: N/A;   Family History  Problem Relation Age of Onset  . Cancer Mother 74       Lung Cancer  . Cancer Father 87       Lung Ca  . Diabetes Son   . Breast cancer Maternal Grandmother   . Kidney cancer Neg Hx   . Bladder Cancer Neg Hx   . Prostate cancer Neg Hx    Social History   Occupational History  . Not on file.   Social History Main Topics  . Smoking status: Former Smoker    Packs/day: 2.00     Years: 35.00    Types: Cigarettes    Quit date: 03/30/2011  . Smokeless tobacco: Never Used  . Alcohol use No  . Drug use: No  . Sexual activity: Not Currently    Tobacco Counseling Counseling given: Not Answered   Activities of Daily Living In your present state of health, do you have any difficulty performing the following activities: 03/27/2017 08/27/2016  Hearing? N N  Vision? N N  Difficulty concentrating or making decisions? N N  Walking or climbing stairs? Y Y  Comment Fear of falling.  Multiple toe amputee. -  Dressing or bathing? N N  Doing errands, shopping? N -  Preparing Food and eating ? N -  Using the Toilet? N -  In the past six months, have you accidently leaked urine? Y -  Comment Followed by Dr. Erlene Quan and PCP.  Managed with a pad, daily. -  Do you have problems with loss of bowel control? N -  Managing your Medications? N -  Managing your Finances? N -  Housekeeping or managing your Housekeeping? N -  Some recent data might be hidden    Immunizations and Health Maintenance Immunization History  Administered Date(s) Administered  . Influenza Split 03/16/2012  . Influenza,inj,Quad PF,6+ Mos 04/06/2013, 03/14/2014, 03/27/2017  . Influenza-Unspecified 04/02/2015, 03/14/2016  . Pneumococcal Conjugate-13 06/15/2014  . Pneumococcal Polysaccharide-23 07/07/2010  . Tdap 03/16/2012   Health Maintenance Due  Topic Date Due  . PNEUMOCOCCAL POLYSACCHARIDE VACCINE (2) 07/08/2015  . FOOT EXAM  07/30/2016  . OPHTHALMOLOGY EXAM  11/20/2016  . INFLUENZA VACCINE  02/04/2017    Patient Care Team: [name], MD as PCP - General (Internal Medicine) Bary Castilla Forest Gleason, MD (General Surgery) [name], MD (Internal Medicine)  Indicate any recent Medical Services you may have received from other than [facility] providers in the past year (date may be approximate).     Assessment:   This is a routine wellness examination for [place]. The goal of the wellness  visit is to assist the patient how to close the gaps in care and create a preventative care plan for the patient.   The roster of all physicians providing medical care to patient is listed in the Snapshot section of the chart.  Taking calcium VIT D as appropriate/Osteoporosis risk reviewed.    Safety issues reviewed; Smoke and carbon monoxide detectors in the home. No firearms in the home.  Wears seatbelts when driving or riding with others. Patient does wear sunscreen or protective clothing when in direct sunlight. No violence in the home.  Patient is alert, normal appearance, oriented to person/place/and time.  Correctly identified the president of the [place], recall of 3/3 words, and performing simple calculations. Displays appropriate judgement and can read correct time from watch face.   No new identified risk were noted.  No failures at ADL's or IADL's.    BMI- discussed the importance of a healthy diet, water intake and the benefits of aerobic exercise. Educational material provided.   24 hour diet recall: Breakfast: banana, peanut butter toast  Lunch: white bean chips, french onion dip, ham deli sandwich  Dinner: baked potato with sour cream, butter, bbq, broccoli Snack: 12 dark chocolate almonds Daily fluid intake: 6 cups of caffeine, 2 cups of water  Dental- dentures.  Eye- Visual acuity not assessed per patient preference since they have regular follow up with the ophthalmologist.  Wears corrective lenses.  Sleep patterns- Sleeps 6-7 hours at night.  Wakes feeling rested.  Naps during the day.  Influenza vaccine administered L deltoid, tolerated well. Educational material provided.  Hearing/Vision screen Hearing Screening Comments: Patient is able to hear conversational tones without difficulty.  No issues reported.   Vision Screening Comments: Followed by [facility] (Dr. Edison Pace) Wears corrective lenses Last OV 03/2017 Cataract extraction, L eye only Visual  acuity not assessed per patient preference since they have regular follow up with the ophthalmologist  Dietary issues and exercise activities discussed: Current Exercise Habits: Home exercise routine, Type of exercise: walking, Time (Minutes): 20, Frequency (Times/Week): 4,  Weekly Exercise (Minutes/Week): 80, Intensity: Mild  Goals    . Increase water intake          Reduce the amount of coffee intake Switch to half caff Stay hydrated     . Reduce portion size          Increase lean proteins Low carb foods      Depression Screen PHQ 2/9 Scores 03/27/2017 04/09/2016 11/14/2015 06/22/2012  PHQ - 2 Score 0 0 0 0  PHQ- 9 Score 0 - - -    Fall Risk Fall Risk  03/27/2017 04/09/2016 11/14/2015  Falls in the past year? No No Yes  Number falls in past yr: - - 1  Injury with Fall? - - No  Risk for fall due to : - - Other (Comment)  Risk for fall due to: Comment - - toe amputations x 4  Follow up - - Education provided;Falls prevention discussed    Cognitive Function: MMSE - Mini Mental State Exam 03/27/2017  Orientation to time 5  Orientation to Place 5  Registration 3  Attention/ Calculation 5  Recall 3  Language- name 2 objects 2  Language- repeat 1  Language- follow 3 step command 3  Language- read & follow direction 1  Write a sentence 1  Copy design 1  Total score 30        Screening Tests Health Maintenance  Topic Date Due  . PNEUMOCOCCAL POLYSACCHARIDE VACCINE (2) 07/08/2015  . FOOT EXAM  07/30/2016  . OPHTHALMOLOGY EXAM  11/20/2016  . INFLUENZA VACCINE  02/04/2017  . HEMOGLOBIN A1C  07/31/2017  . URINE MICROALBUMIN  09/30/2017  . PAP SMEAR  07/30/2018  . MAMMOGRAM  09/10/2018  . TETANUS/TDAP  03/16/2022  . COLONOSCOPY  04/27/2022  . Hepatitis C Screening  Completed  . HIV Screening  Completed      Plan:    End of life planning; Advance aging; Advanced directives discussed. Copy of current HCPOA/Living Will requested.    I have personally reviewed and  noted the following in the patient's chart:   . Medical and social history . Use of alcohol, tobacco or illicit drugs  . Current medications and supplements . Functional ability and status . Nutritional status . Physical activity . Advanced directives . List of other physicians . Hospitalizations, surgeries, and ER visits in previous 12 months . Vitals . Screenings to include cognitive, depression, and falls . Referrals and appointments  In addition, I have reviewed and discussed with patient certain preventive protocols, quality metrics, and best practice recommendations. A written personalized care plan for preventive services as well as general preventive health recommendations were provided to patient.     Varney Biles, LPN   10/31/621

## 2017-03-27 NOTE — Patient Instructions (Addendum)
  Ms. Barsamian , Thank you for taking time to come for your Medicare Wellness Visit. I appreciate your ongoing commitment to your health goals. Please review the following plan we discussed and let me know if I can assist you in the future.   Diabetic follow up with Dr. Derrel Nip.  Bring a copy of your [place] and/or Living Will to be scanned into chart.  Have a great day!  These are the goals we discussed: Goals    . Increase water intake          Reduce the amount of coffee intake Switch to half caff Stay hydrated     . Reduce portion size          Increase lean proteins Low carb foods       This is a list of the screening recommended for you and due dates:  Health Maintenance  Topic Date Due  . Pneumococcal vaccine (2) 07/08/2015  . Complete foot exam   07/30/2016  . Eye exam for diabetics  11/20/2016  . Flu Shot  02/04/2017  . Hemoglobin A1C  07/31/2017  . Urine Protein Check  09/30/2017  . Pap Smear  07/30/2018  . Mammogram  09/10/2018  . Tetanus Vaccine  03/16/2022  . Colon Cancer Screening  04/27/2022  .  Hepatitis C: One time screening is recommended by Center for Disease Control  (CDC) for  adults born from [birth year] through 1967.   Completed  . HIV Screening  Completed

## 2017-03-27 NOTE — Progress Notes (Signed)
I have reviewed the above note and agree.  Bryton Romagnoli, M.D.  

## 2017-03-31 ENCOUNTER — Encounter: Payer: Self-pay | Admitting: Internal Medicine

## 2017-03-31 ENCOUNTER — Ambulatory Visit (INDEPENDENT_AMBULATORY_CARE_PROVIDER_SITE_OTHER): Payer: Medicare Other | Admitting: Internal Medicine

## 2017-03-31 DIAGNOSIS — I10 Essential (primary) hypertension: Secondary | ICD-10-CM

## 2017-03-31 DIAGNOSIS — F411 Generalized anxiety disorder: Secondary | ICD-10-CM | POA: Diagnosis not present

## 2017-03-31 MED ORDER — ALPRAZOLAM 0.5 MG PO TABS: 0.5000 mg | ORAL_TABLET | Freq: Every day | ORAL | 3 refills | Status: DC | PRN

## 2017-03-31 NOTE — Progress Notes (Signed)
Subjective:  Patient ID: Laura Reed, female    DOB: [birth year]  Age: 56 y.o. MRN: [id number]  CC: Diagnoses of Anxiety state and Essential hypertension were pertinent to this visit.  HPI Laura Reed presents for management of anxiety .  Aggravated by financial stressors..  She has a history of credit card debt that  started 4 years ago.   Has been paying it off over time , which has been difficult for her financially, and aggravated by her loaning her son $1000 when he developed financial troubles due to his addiction to heroin .  He is paying her back monthly but is often late with the payment and she lives from month to month.    Would like to resume use of alprazolam  Sugars have been elevated up to 200 fasting at times due ot stress eating   Treated for pink eye by [facility]  Getting better.  BP elevated today due to  Current anxiety state .   Has reduced coffee and increasing water consumptions  Lab Results  Component Value Date   CREATININE 1.04 01/28/2017   .      Lab Results  Component Value Date   HGBA1C 7.4 (H) 01/28/2017     Outpatient Medications Prior to Visit  Medication Sig Dispense Refill  . ACCU-CHEK AVIVA PLUS test strip TEST BLOOD SUGAR 2 TO 3 TIMES DAILY 100 each 0  . acetaminophen (TYLENOL) 500 MG tablet Take 1,000 mg by mouth every 6 (six) hours as needed for mild pain or headache.     . AMITIZA 24 MCG capsule TAKE ONE CAPSULE BY MOUTH TWICE DAILY WITH A MEAL 60 capsule 0  . amLODipine (NORVASC) 5 MG tablet Take 1 tablet (5 mg total) by mouth daily. 90 tablet 3  . aspirin 81 MG chewable tablet Chew 81 mg by mouth daily.    Marland Kitchen atorvastatin (LIPITOR) 20 MG tablet TAKE 1 TABLET BY MOUTH EVERY DAY 90 tablet 3  . B-D ULTRAFINE III SHORT PEN 31G X 8 MM MISC TEST BLOOD SUGAR TWICE DAILY 100 each 0  . B-D ULTRAFINE III SHORT PEN 31G X 8 MM MISC TEST BLOOD SUGAR TWICE DAILY 100 each 0  . Cholecalciferol (VITAMIN D3) 2000 UNITS TABS Take 2,000 Units  by mouth daily after supper.     . clopidogrel (PLAVIX) 75 MG tablet TAKE 1 TABLET BY MOUTH EVERY DAY 90 tablet 0  . DIGOX 125 MCG tablet TAKE 1 TABLET BY MOUTH EVERY DAY (Patient taking differently: TAKE 1 TABLET BY MOUTH EVERY DAY IN THE EVENING) 90 tablet 3  . diphenhydrAMINE (BENADRYL) 25 mg capsule Take 25 mg by mouth at bedtime as needed for sleep.    Marland Kitchen gabapentin (NEURONTIN) 600 MG tablet TAKE 1 TABLET BY MOUTH THREE TIMES DAILY 270 tablet 0  . HUMALOG MIX 75/25 KWIKPEN (75-25) 100 UNIT/ML Kwikpen INJECT 30 UNITS UNDER THE SKIN EVERY MORNING AND 36 UNITS EVERY EVENING 30 mL 3  . Lactulose 20 GM/30ML SOLN 30 ml every 4 hours until constipation is relieved (Patient taking differently: Take 30 mLs by mouth See admin instructions. 30 ml every 4 hours until constipation is relieved) 236 mL 3  . LEVEMIR FLEXTOUCH 100 UNIT/ML Pen INJECT 40 UNITS UNDER THE SKIN EVERY DAY AT 10 PM 15 mL 0  . LORazepam (ATIVAN) 0.5 MG tablet TAKE 1 TABLET BY MOUTH AT BEDTIME. MAY REPEAT IN 1 HOUR IF NEEDED 60 tablet 0  . mometasone (ELOCON) 0.1 % lotion  Apply 1-2 application topically daily as needed (into ear canal for ear irriation).     . neomycin-polymyxin b-dexamethasone (MAXITROL) 3.5-10000-0.1 SUSP SHAKE LQ AND INT 1 GTT IN OD TID FOR 7 DAYS  0  . TRUEPLUS LANCETS 28G MISC CHECK BLOOD SUGAR FOUR TIMES DAILY 200 each 0  . sulfamethoxazole-trimethoprim (BACTRIM DS,SEPTRA DS) 800-160 MG tablet Take 1 tablet by mouth every 12 (twelve) hours. (Patient not taking: Reported on 03/31/2017) 14 tablet 0   Facility-Administered Medications Prior to Visit  Medication Dose Route Frequency Provider Last Rate Last Dose  . lidocaine-EPINEPHrine (XYLOCAINE W/EPI) 2 %-1:100000 (with pres) injection 1.7 mL  1.7 mL Intradermal Once [name], MD        Review of Systems;  Patient denies headache, fevers, malaise, unintentional weight loss, skin rash, eye pain, sinus congestion and sinus pain, sore throat, dysphagia,   hemoptysis , cough, dyspnea, wheezing, chest pain, palpitations, orthopnea, edema, abdominal pain, nausea, melena, diarrhea, constipation, flank pain, dysuria, hematuria, urinary  Frequency, nocturia, numbness, tingling, seizures,  Focal weakness, Loss of consciousness,  Tremor, insomnia, depression, anxiety, and suicidal ideation.      Objective:  BP (!) 170/70 (BP Location: Left Arm, Patient Position: Sitting, Cuff Size: Normal)   Pulse 77   Temp 98.4 F (36.9 C) (Oral)   Resp 16   Ht 5\' 7"  (1.702 m)   Wt 165 lb 3.2 oz (74.9 kg)   SpO2 96%   BMI 25.87 kg/m   BP Readings from Last 3 Encounters:  03/31/17 (!) 170/70  03/27/17 138/70  03/03/17 (!) 101/57    Wt Readings from Last 3 Encounters:  03/31/17 165 lb 3.2 oz (74.9 kg)  03/27/17 163 lb 12.8 oz (74.3 kg)  03/03/17 164 lb (74.4 kg)    General appearance: alert, cooperative and appears stated age Ears: normal TM's and external ear canals both ears Throat: lips, mucosa, and tongue normal; teeth and gums normal Neck: no adenopathy, no carotid bruit, supple, symmetrical, trachea midline and thyroid not enlarged, symmetric, no tenderness/mass/nodules Back: symmetric, no curvature. ROM normal. No CVA tenderness. Lungs: clear to auscultation bilaterally Heart: regular rate and rhythm, S1, S2 normal, no murmur, click, rub or gallop Abdomen: soft, non-tender; bowel sounds normal; no masses,  no organomegaly Pulses: 2+ and symmetric Skin: Skin color, texture, turgor normal. No rashes or lesions Lymph nodes: Cervical, supraclavicular, and axillary nodes normal.  Lab Results  Component Value Date   HGBA1C 7.4 (H) 01/28/2017   HGBA1C 7.5 (H) 09/30/2016   HGBA1C 6.8 (H) 05/21/2016    Lab Results  Component Value Date   CREATININE 1.04 01/28/2017   CREATININE 0.99 12/09/2016   CREATININE 1.05 09/30/2016   CREATININE 1.05 09/30/2016    Lab Results  Component Value Date   WBC 10.5 02/18/2016   HGB 12.2 09/01/2016   HCT  36.0 09/01/2016   PLT 266.0 02/18/2016   GLUCOSE 171 (H) 01/28/2017   CHOL 100 09/30/2016   TRIG 108.0 09/30/2016   HDL 37.80 (L) 09/30/2016   LDLDIRECT 47.0 01/28/2017   LDLCALC 41 09/30/2016   ALT 18 01/28/2017   AST 16 01/28/2017   NA 136 01/28/2017   K 4.7 01/28/2017   CL 105 01/28/2017   CREATININE 1.04 01/28/2017   BUN 29 (H) 01/28/2017   CO2 29 01/28/2017   TSH 0.96 05/18/2015   INR 0.9 10/17/2014   HGBA1C 7.4 (H) 01/28/2017   MICROALBUR 8.3 (H) 09/30/2016    No results found.  Assessment & Plan:  Problem List Items Addressed This Visit    Anxiety state    Secondary to increased financial stressors.  Resume once daily alprazolam       Relevant Medications   ALPRAZolam (XANAX) 0.5 MG tablet   Hypertension    Elevated today due to anxiety.   She has been asked to check her pressures at home and submit readings for evaluation.       A total of 25 minutes of face to face time was spent with patient more than half of which was spent in counselling about the above mentioned conditions  and coordination of care   I have discontinued Ms. Marcucci's sulfamethoxazole-trimethoprim. I have also changed her ALPRAZolam. Additionally, I am having her maintain her Vitamin D3, aspirin, Lactulose, HUMALOG MIX 75/25 KWIKPEN, B-D ULTRAFINE III SHORT PEN, TRUEPLUS LANCETS 28G, mometasone, DIGOX, acetaminophen, amLODipine, atorvastatin, diphenhydrAMINE, B-D ULTRAFINE III SHORT PEN, LORazepam, LEVEMIR FLEXTOUCH, gabapentin, AMITIZA, ACCU-CHEK AVIVA PLUS, clopidogrel, and neomycin-polymyxin b-dexamethasone. We will continue to administer lidocaine-EPINEPHrine.  Meds ordered this encounter  Medications  . ALPRAZolam (XANAX) 0.5 MG tablet    Sig: Take 1 tablet (0.5 mg total) by mouth daily as needed. for anxiety    Dispense:  30 tablet    Refill:  3    Medications Discontinued During This Encounter  Medication Reason  . sulfamethoxazole-trimethoprim (BACTRIM DS,SEPTRA DS) 800-160 MG  tablet Completed Course    Follow-up: No Follow-up on file.   [name], MD

## 2017-03-31 NOTE — Patient Instructions (Addendum)
I have refilled your alprazolam for a few months.  Do not combine with alcohol .  Do not use more than once daily  If your BP does not improve  After taking your alprazolam , let me know and I will adjust your bp medications

## 2017-04-01 DIAGNOSIS — F411 Generalized anxiety disorder: Secondary | ICD-10-CM | POA: Insufficient documentation

## 2017-04-01 NOTE — Assessment & Plan Note (Signed)
Secondary to increased financial stressors.  Resume once daily alprazolam

## 2017-04-01 NOTE — Assessment & Plan Note (Addendum)
Elevated today due to anxiety.   She has been asked to check her pressures at home and submit readings for evaluation.

## 2017-04-01 NOTE — Telephone Encounter (Signed)
Error

## 2017-04-02 ENCOUNTER — Other Ambulatory Visit: Payer: Self-pay | Admitting: Internal Medicine

## 2017-04-06 ENCOUNTER — Other Ambulatory Visit: Payer: Self-pay | Admitting: Internal Medicine

## 2017-04-07 ENCOUNTER — Encounter: Payer: Self-pay | Admitting: Urology

## 2017-04-07 ENCOUNTER — Ambulatory Visit (INDEPENDENT_AMBULATORY_CARE_PROVIDER_SITE_OTHER): Payer: Medicare Other | Admitting: Urology

## 2017-04-07 VITALS — BP 115/68 | HR 73 | Ht 67.0 in | Wt 163.0 lb

## 2017-04-07 DIAGNOSIS — C677 Malignant neoplasm of urachus: Secondary | ICD-10-CM | POA: Diagnosis not present

## 2017-04-07 DIAGNOSIS — C679 Malignant neoplasm of bladder, unspecified: Secondary | ICD-10-CM | POA: Diagnosis not present

## 2017-04-07 DIAGNOSIS — N3000 Acute cystitis without hematuria: Secondary | ICD-10-CM

## 2017-04-07 LAB — URINALYSIS, COMPLETE
BILIRUBIN UA: NEGATIVE
GLUCOSE, UA: NEGATIVE
Ketones, UA: NEGATIVE
Nitrite, UA: NEGATIVE
PROTEIN UA: NEGATIVE
UUROB: 0.2 mg/dL (ref 0.2–1.0)
pH, UA: 5.5 (ref 5.0–7.5)

## 2017-04-07 LAB — MICROSCOPIC EXAMINATION

## 2017-04-07 MED ORDER — LIDOCAINE HCL 2 % EX GEL
1.0000 "application " | Freq: Once | CUTANEOUS | Status: AC
  Administered 2017-04-07: 1 via URETHRAL

## 2017-04-07 MED ORDER — CIPROFLOXACIN HCL 500 MG PO TABS
500.0000 mg | ORAL_TABLET | Freq: Once | ORAL | Status: AC
  Administered 2017-04-07: 500 mg via ORAL

## 2017-04-07 NOTE — Progress Notes (Signed)
   04/07/17  CC:  Chief Complaint  Patient presents with  . Cysto    HPI: 56 yo F with with history of pT1N0 left upper tract urothelial carcinoma s/p nephroU 4/016 with low grade Ta bladder recurrence s/p TURBT, mitomycin, right retrograde pyelogram on 2/18.    Surveillance cystoscopy was rescheduled from 8/28 at which time she was having signs and symptoms of UTI. He was frankly positive.  Urine culture ultimately grew Escherichia coli and she underwent full course of treatment with Bactrim.  Her symptoms have now resolved.  1- Left Upper Tract Transitional Cell Carcinoma/ Bladder cancer recurrence- s/p LEFT robotic nephroureterectomy on 10/31/2014 as well as combined ventral hernia repair with Dr. Bary Castilla. Post complicated by wound infection now well healed. Pathology pT1N0 with negative margins.  Recent Surveillance: 02/2015 - cysto NED; 05/2015 cysto NED, 08/2015- cysto NED, 11/2015- NED, CT Urogram 11/12/15 negative, poor quality of delayed phase.; 5/2-17- cysto with mild erythema following recent UTI; 02/2016 NED; 08/2016 Lg Ta TCC recurrence,  1 cm, multifocal.  RTG neg.  08/2016- TURBT for bladder 1 cm recurrence near bladder neck and left UO, multifocal.  R RTG negative. Mitomycin.  LgTa. 10/2016- Induction BCG x 6.  2 - Solitary Kidney - s/p left nephrectomy as per above. Most recent Cr 1.04 01/2017.   She does have an extensive smoking history, quit 5years ago but smoked up to 2 packs a day for 35 years. She also has multiple medical comorbidities including history of diabetes, CAD status post CABG, carotid endarterectomy.    Blood pressure 115/68, pulse 73, height 5\' 7"  (1.702 m), weight 163 lb (73.9 kg). NED. A&Ox3.   No respiratory distress   Abd soft, NT, ND Normal external genitalia with patent urethral meatus  Cystoscopy Procedure Note  Patient identification was confirmed, informed consent was obtained, and patient was prepped using Betadine solution.  Lidocaine  jelly was administered per urethral meatus.    Preoperative abx where received prior to procedure.    Procedure: - Flexible cystoscope introduced, without any difficulty.   - Thorough search of the bladder revealed:    normal urethral meatus    None specific irregular patchy erythema throughout bladder consistent with cystitis    no stones    no ulcers     no tumors    no urethral polyps    no trabeculation  - Right UO normal. Left UO was surgically absent.  Post-Procedure: - Patient tolerated the procedure well  Assessment/ Plan:  1. Malignant neoplasm of urinary bladder, unspecified site [facility]) Cystoscopy today essentially unremarkable other than for a few areas of patchy erythema which are most consistent with her recent urinary tract infection Cytology sent today As long as cytology is negative, will repeat cystoscopy in 3 months If erythema persists, will return for biopsy, otherwise continue surveillance - Urinalysis, Complete - ciprofloxacin (CIPRO) tablet 500 mg; Take 1 tablet (500 mg total) by mouth once. - lidocaine (XYLOCAINE) 2 % jelly 1 application; Place 1 application into the urethra once.  2. Acute cystitis without hematuria As above  Return in about 3 months (around 07/08/2017) for cysto.   Hollice Espy, MD

## 2017-04-12 ENCOUNTER — Other Ambulatory Visit: Payer: Self-pay | Admitting: Internal Medicine

## 2017-04-14 ENCOUNTER — Other Ambulatory Visit: Payer: Self-pay | Admitting: Urology

## 2017-04-15 DIAGNOSIS — L851 Acquired keratosis [keratoderma] palmaris et plantaris: Secondary | ICD-10-CM | POA: Diagnosis not present

## 2017-04-15 DIAGNOSIS — Z794 Long term (current) use of insulin: Secondary | ICD-10-CM | POA: Diagnosis not present

## 2017-04-15 DIAGNOSIS — E1140 Type 2 diabetes mellitus with diabetic neuropathy, unspecified: Secondary | ICD-10-CM | POA: Diagnosis not present

## 2017-04-15 DIAGNOSIS — B351 Tinea unguium: Secondary | ICD-10-CM | POA: Diagnosis not present

## 2017-04-20 ENCOUNTER — Encounter (INDEPENDENT_AMBULATORY_CARE_PROVIDER_SITE_OTHER): Payer: Medicare Other

## 2017-04-20 ENCOUNTER — Ambulatory Visit (INDEPENDENT_AMBULATORY_CARE_PROVIDER_SITE_OTHER): Payer: Medicare Other | Admitting: Vascular Surgery

## 2017-05-11 ENCOUNTER — Other Ambulatory Visit: Payer: Self-pay | Admitting: Internal Medicine

## 2017-05-26 ENCOUNTER — Other Ambulatory Visit: Payer: Self-pay | Admitting: Internal Medicine

## 2017-05-26 DIAGNOSIS — L97521 Non-pressure chronic ulcer of other part of left foot limited to breakdown of skin: Secondary | ICD-10-CM | POA: Diagnosis not present

## 2017-06-01 ENCOUNTER — Telehealth: Payer: Self-pay | Admitting: Internal Medicine

## 2017-06-01 DIAGNOSIS — H6692 Otitis media, unspecified, left ear: Secondary | ICD-10-CM

## 2017-06-01 NOTE — Telephone Encounter (Signed)
Copied from [place]. Topic: Referral - Request >> Jun 01, 2017  9:43 AM Oneta Rack wrote: Relation to pt: self Call back number: [phone]  Pharmacy:  Reason for call:  Patient experiencing ongoing left ear clogged, ear irration and sinus infection, patient has an appointment tomorrow with Dr. [name] at [facility] [facility], patient requesting referral faxed today, please advise

## 2017-06-01 NOTE — Telephone Encounter (Signed)
Patient is requesting referral for [facility].

## 2017-06-01 NOTE — Telephone Encounter (Signed)
Urgent referral placed per patient request.  Patient has appt tomorrow

## 2017-06-02 DIAGNOSIS — H60332 Swimmer's ear, left ear: Secondary | ICD-10-CM | POA: Diagnosis not present

## 2017-06-02 DIAGNOSIS — J3489 Other specified disorders of nose and nasal sinuses: Secondary | ICD-10-CM | POA: Diagnosis not present

## 2017-06-02 NOTE — Telephone Encounter (Signed)
Referral has been sent.

## 2017-06-03 NOTE — Progress Notes (Signed)
MRN : [id number]  Laura Reed is a 55 y.o. (September 29, 1960) female who presents with chief complaint of No chief complaint on file. Marland Kitchen  History of Present Illness: The patient returns to the office for followup and review of the noninvasive studies. There have been no interval changes in lower extremity symptoms. No interval shortening of the patient's claudication distance or development of rest pain symptoms. No new ulcers or wounds have occurred since the last visit.  There have been no significant changes to the patient's overall health care.  The patient denies amaurosis fugax or recent TIA symptoms. There are no recent neurological changes noted. The patient denies history of DVT, PE or superficial thrombophlebitis. The patient denies recent episodes of angina or shortness of breath.   ABI Rt=0.0.82 and Lt=0.79  (previous ABI's Rt=0.94 and Lt=0.93) Duplex ultrasound of the aorta iliac and legs shows the velocities are about the same as last study  No outpatient medications have been marked as taking for the 06/04/17 encounter (Appointment) with Delana Meyer, Dolores Lory, MD.   Current Facility-Administered Medications for the 06/04/17 encounter (Appointment) with Delana Meyer, Dolores Lory, MD  Medication  . lidocaine-EPINEPHrine (XYLOCAINE W/EPI) 2 %-1:100000 (with pres) injection 1.7 mL    Past Medical History:  Diagnosis Date  . Anxiety   . Bladder cancer ([place])   . Cancer ([place])   . CHF (congestive heart failure) ([place])   . Coronary artery disease   . Diabetes mellitus   . Heart murmur   . Hemorrhoid   . Hypertension   . Neuropathy   . PVD (peripheral vascular disease) ([place])   . Urothelial carcinoma of kidney ([place]) 10/31/2014   INVASIVE UROTHELIAL CARCINOMA, LOW GRADE. T1, Nx.    Past Surgical History:  Procedure Laterality Date  . AMPUTATION TOE    . ARTERIAL BYPASS SURGRY  2009, 2013 x 2   right leg , done in [place]  . CARDIAC CATHETERIZATION    . CAROTID ENDARTERECTOMY  Right 01/2014   Dr Delana Meyer  . CATARACT EXTRACTION W/PHACO Right 12/14/2014   Procedure: CATARACT EXTRACTION PHACO AND INTRAOCULAR LENS PLACEMENT (IOC);  Surgeon: Lyla Glassing, MD;  Location: [facility] ORS;  Service: Ophthalmology;  Laterality: Right;  [place]   00:38.6              AP        7.1                   CDE  2.76  . CESAREAN SECTION    . CHOLECYSTECTOMY  03-03-12   Porcelain gallbladder, gallstones,  [place]  . COLONOSCOPY W/ BIOPSIES  04/28/2012   Hyperplastic rectal polyps.  . CORONARY ARTERY BYPASS GRAFT  2009   3 vessel  . CYSTOSCOPY W/ RETROGRADES Right 09/01/2016   Procedure: CYSTOSCOPY WITH RETROGRADE PYELOGRAM;  Surgeon: Hollice Espy, MD;  Location: [facility] ORS;  Service: Urology;  Laterality: Right;  . HERNIA REPAIR  10-31-14   ventral, retro-rectus atrium mesh  . LOWER EXTREMITY ANGIOGRAPHY Left 12/10/2016   Procedure: Lower Extremity Angiography;  Surgeon: Katha Cabal, MD;  Location: [facility];  Service: Cardiovascular;  Laterality: Left;  . NEPHRECTOMY Left 10-31-14  . PERIPHERAL VASCULAR CATHETERIZATION Left 05/01/2015   Procedure: Lower Extremity Angiography;  Surgeon: Katha Cabal, MD;  Location: [place] [facility];  Service: Cardiovascular;  Laterality: Left;  . PERIPHERAL VASCULAR CATHETERIZATION  05/01/2015   Procedure: Lower Extremity Intervention;  Surgeon: Katha Cabal, MD;  Location: [facility] [place]  [facility];  Service: Cardiovascular;;  . PERIPHERAL VASCULAR CATHETERIZATION Left 02/20/2015   Procedure: Pelvic Angiography;  Surgeon: Katha Cabal, MD;  Location: [facility];  Service: Cardiovascular;  Laterality: Left;  . TRANSURETHRAL RESECTION OF BLADDER TUMOR WITH MITOMYCIN-C N/A 09/01/2016   Procedure: TRANSURETHRAL RESECTION OF BLADDER TUMOR WITH MITOMYCIN-C;  Surgeon: Hollice Espy, MD;  Location: [facility] ORS;  Service: Urology;  Laterality: N/A;    Social History Social History   Tobacco Use  . Smoking status: Former Smoker     Packs/day: 2.00    Years: 35.00    Pack years: 70.00    Types: Cigarettes    Last attempt to quit: 03/30/2011    Years since quitting: 6.1  . Smokeless tobacco: Never Used  Substance Use Topics  . Alcohol use: No    Alcohol/week: 0.0 oz  . Drug use: No    Family History Family History  Problem Relation Age of Onset  . Cancer Mother 80       Lung Cancer  . Cancer Father 78       Lung Ca  . Diabetes Son   . Breast cancer Maternal Grandmother   . Kidney cancer Neg Hx   . Bladder Cancer Neg Hx   . Prostate cancer Neg Hx     No Known Allergies   REVIEW OF SYSTEMS (Negative unless checked)  Constitutional: [] Weight loss  [] Fever  [] Chills Cardiac: [] Chest pain   [] Chest pressure   [] Palpitations   [] Shortness of breath when laying flat   [] Shortness of breath with exertion. Vascular:  [] Pain in legs with walking   [] Pain in legs at rest  [] History of DVT   [] Phlebitis   [x] Swelling in legs   [] Varicose veins   [] Non-healing ulcers Pulmonary:   [] Uses home oxygen   [] Productive cough   [] Hemoptysis   [] Wheeze  [] COPD   [] Asthma Neurologic:  [] Dizziness   [] Seizures   [] History of stroke   [] History of TIA  [] Aphasia   [] Vissual changes   [] Weakness or numbness in arm   [] Weakness or numbness in leg Musculoskeletal:   [] Joint swelling   [x] Joint pain   [] Low back pain Hematologic:  [] Easy bruising  [] Easy bleeding   [] Hypercoagulable state   [] Anemic Gastrointestinal:  [] Diarrhea   [] Vomiting  [] Gastroesophageal reflux/heartburn   [] Difficulty swallowing. Genitourinary:  [] Chronic kidney disease   [] Difficult urination  [] Frequent urination   [] Blood in urine Skin:  [] Rashes   [] Ulcers  Psychological:  [] History of anxiety   []  History of major depression.  Physical Examination  There were no vitals filed for this visit. There is no height or weight on file to calculate BMI. Gen: WD/WN, NAD Head: [place]/AT, No temporalis wasting.  Ear/Nose/Throat: Hearing grossly intact, nares  w/o erythema or drainage Eyes: PER, EOMI, sclera nonicteric.  Neck: Supple, no large masses.   Pulmonary:  Good air movement, no audible wheezing bilaterally, no use of accessory muscles.  Cardiac: RRR, no JVD Vascular: Vessel Right Left  Radial Palpable Palpable  Popliteal 1+ Palpable 1+ Palpable  PT Not Palpable Not Palpable  DP Not Palpable Not Palpable  Gastrointestinal: Non-distended. No guarding/no peritoneal signs.  Musculoskeletal: M/S 5/5 throughout.  No deformity or atrophy.  Neurologic: CN 2-12 intact. Symmetrical.  Speech is fluent. Motor exam as listed above. Psychiatric: Judgment intact, Mood & affect appropriate for pt's clinical situation. Dermatologic: No rashes or ulcers noted.  No changes consistent with cellulitis. Lymph : No lichenification or skin changes  of chronic lymphedema.  CBC Lab Results  Component Value Date   WBC 10.5 02/18/2016   HGB 12.2 09/01/2016   HCT 36.0 09/01/2016   MCV 87.0 02/18/2016   PLT 266.0 02/18/2016    BMET    Component Value Date/Time   NA 136 01/28/2017 1509   NA 135 11/02/2014 0609   K 4.7 01/28/2017 1509   K 4.2 11/02/2014 0609   CL 105 01/28/2017 1509   CL 107 11/02/2014 0609   CO2 29 01/28/2017 1509   CO2 23 11/02/2014 0609   GLUCOSE 171 (H) 01/28/2017 1509   GLUCOSE 108 (H) 11/02/2014 0609   BUN 29 (H) 01/28/2017 1509   BUN 20 11/02/2014 0609   CREATININE 1.04 01/28/2017 1509   CREATININE 1.01 11/09/2015 1549   CREATININE 1.01 11/09/2015 1549   CALCIUM 9.5 01/28/2017 1509   CALCIUM 7.3 (L) 11/02/2014 0609   GFRNONAA >60 12/09/2016 0812   GFRNONAA 50 (L) 11/02/2014 0609   GFRAA >60 12/09/2016 0812   GFRAA 58 (L) 11/02/2014 0609   CrCl cannot be calculated (Patient's most recent lab result is older than the maximum 21 days allowed.).  COAG Lab Results  Component Value Date   INR 0.9 10/17/2014   INR 1.1 01/19/2014   INR 0.9 12/06/2013    Radiology No results found.    Assessment/Plan 1.  Atherosclerosis of native artery of both lower extremities with intermittent claudication (HCC)  Recommend:  The patient has evidence of atherosclerosis of the lower extremities with claudication.  The patient does not voice lifestyle limiting changes at this point in time.  Noninvasive studies do not suggest clinically significant change.  No invasive studies, angiography or surgery at this time The patient should continue walking and begin a more formal exercise program.  The patient should continue antiplatelet therapy and aggressive treatment of the lipid abnormalities  No changes in the patient's medications at this time  The patient should continue wearing graduated compression socks 10-15 mmHg strength to control the mild edema.   - VAS [place] ABI WITH/WO TBI; Future - VAS US AORTA/IVC/ILIACS; Future - VAS [place] LOWER EXTREMITY ARTERIAL DUPLEX; Future  2. Bilateral carotid artery stenosis Recommend:  Given the patient's asymptomatic subcritical stenosis no further invasive testing or surgery at this time.  Continue antiplatelet therapy as prescribed Continue management of CAD, HTN and Hyperlipidemia Healthy heart diet,  encouraged exercise at least 4 times per week Follow up in 6 months with duplex ultrasound and physical exam   - VAS US CAROTID; Future  3. Hyperlipidemia associated with type 2 diabetes mellitus ([place]) Continue statin as ordered and reviewed, no changes at this time   4. Coronary artery abnormality Continue cardiac and antihypertensive medications as already ordered and reviewed, no changes at this time.  Continue statin as ordered and reviewed, no changes at this time  Nitrates PRN for chest pain   5. Essential hypertension Continue antihypertensive medications as already ordered, these medications have been reviewed and there are no changes at this time.    Hortencia Pilar, MD  06/03/2017 10:54 AM

## 2017-06-04 ENCOUNTER — Ambulatory Visit (INDEPENDENT_AMBULATORY_CARE_PROVIDER_SITE_OTHER): Payer: Medicare Other

## 2017-06-04 ENCOUNTER — Other Ambulatory Visit: Payer: Self-pay | Admitting: Internal Medicine

## 2017-06-04 ENCOUNTER — Encounter (INDEPENDENT_AMBULATORY_CARE_PROVIDER_SITE_OTHER): Payer: Self-pay | Admitting: Vascular Surgery

## 2017-06-04 ENCOUNTER — Ambulatory Visit (INDEPENDENT_AMBULATORY_CARE_PROVIDER_SITE_OTHER): Payer: Medicare Other | Admitting: Vascular Surgery

## 2017-06-04 VITALS — BP 90/57 | HR 73 | Resp 17 | Wt 163.6 lb

## 2017-06-04 DIAGNOSIS — E785 Hyperlipidemia, unspecified: Secondary | ICD-10-CM

## 2017-06-04 DIAGNOSIS — I10 Essential (primary) hypertension: Secondary | ICD-10-CM | POA: Diagnosis not present

## 2017-06-04 DIAGNOSIS — E1169 Type 2 diabetes mellitus with other specified complication: Secondary | ICD-10-CM | POA: Diagnosis not present

## 2017-06-04 DIAGNOSIS — Q245 Malformation of coronary vessels: Secondary | ICD-10-CM | POA: Diagnosis not present

## 2017-06-04 DIAGNOSIS — I6523 Occlusion and stenosis of bilateral carotid arteries: Secondary | ICD-10-CM | POA: Diagnosis not present

## 2017-06-04 DIAGNOSIS — I70213 Atherosclerosis of native arteries of extremities with intermittent claudication, bilateral legs: Secondary | ICD-10-CM | POA: Diagnosis not present

## 2017-06-05 ENCOUNTER — Encounter: Payer: Self-pay | Admitting: Internal Medicine

## 2017-06-05 ENCOUNTER — Ambulatory Visit (INDEPENDENT_AMBULATORY_CARE_PROVIDER_SITE_OTHER): Payer: Medicare Other | Admitting: Internal Medicine

## 2017-06-05 VITALS — BP 140/64 | HR 76 | Temp 98.0°F | Resp 16 | Ht 67.0 in | Wt 166.0 lb

## 2017-06-05 DIAGNOSIS — E1169 Type 2 diabetes mellitus with other specified complication: Secondary | ICD-10-CM

## 2017-06-05 DIAGNOSIS — I10 Essential (primary) hypertension: Secondary | ICD-10-CM | POA: Diagnosis not present

## 2017-06-05 DIAGNOSIS — I70213 Atherosclerosis of native arteries of extremities with intermittent claudication, bilateral legs: Secondary | ICD-10-CM

## 2017-06-05 DIAGNOSIS — E785 Hyperlipidemia, unspecified: Secondary | ICD-10-CM | POA: Diagnosis not present

## 2017-06-05 DIAGNOSIS — E1151 Type 2 diabetes mellitus with diabetic peripheral angiopathy without gangrene: Secondary | ICD-10-CM | POA: Diagnosis not present

## 2017-06-05 DIAGNOSIS — E1142 Type 2 diabetes mellitus with diabetic polyneuropathy: Secondary | ICD-10-CM | POA: Diagnosis not present

## 2017-06-05 LAB — COMPREHENSIVE METABOLIC PANEL
ALT: 19 U/L (ref 0–35)
AST: 17 U/L (ref 0–37)
Albumin: 4.2 g/dL (ref 3.5–5.2)
Alkaline Phosphatase: 80 U/L (ref 39–117)
BUN: 38 mg/dL — ABNORMAL HIGH (ref 6–23)
CO2: 29 mEq/L (ref 19–32)
Calcium: 9.5 mg/dL (ref 8.4–10.5)
Chloride: 102 mEq/L (ref 96–112)
Creatinine, Ser: 1.09 mg/dL (ref 0.40–1.20)
GFR: 54.66 mL/min — ABNORMAL LOW (ref >60.00)
Glucose, Bld: 182 mg/dL — ABNORMAL HIGH (ref 70–99)
Potassium: 4.8 mEq/L (ref 3.5–5.1)
Sodium: 136 mEq/L (ref 135–145)
Total Bilirubin: 0.3 mg/dL (ref 0.2–1.2)
Total Protein: 7.2 g/dL (ref 6.0–8.3)

## 2017-06-05 LAB — HEMOGLOBIN A1C: Hgb A1c MFr Bld: 7.6 % — ABNORMAL HIGH (ref 4.6–6.5)

## 2017-06-05 LAB — LIPID PANEL
CHOL/HDL RATIO: 3
Cholesterol: 123 mg/dL (ref 0–200)
HDL: 44.8 mg/dL (ref >39.00)
LDL Cholesterol: 59 mg/dL (ref 0–99)
NONHDL: 77.98
Triglycerides: 97 mg/dL (ref 0.0–149.0)
VLDL: 19.4 mg/dL (ref 0.0–40.0)

## 2017-06-05 MED ORDER — ZOSTER VAC RECOMB ADJUVANTED 50 MCG/0.5ML IM SUSR: 0.5000 mL | Freq: Once | INTRAMUSCULAR | 1 refills | Status: AC

## 2017-06-05 MED ORDER — ALPRAZOLAM 0.5 MG PO TABS: 0.5000 mg | ORAL_TABLET | Freq: Every day | ORAL | 5 refills | Status: DC | PRN

## 2017-06-05 NOTE — Progress Notes (Signed)
Subjective:  Patient ID: Laura Reed, female    DOB: 1960-11-09  Age: 56 y.o. MRN: [id number]  CC: The primary encounter diagnosis was Diabetic polyneuropathy associated with type 2 diabetes mellitus ([place]). Diagnoses of Atherosclerosis of native artery of both lower extremities with intermittent claudication ([place]), Controlled type 2 DM with peripheral circulatory disorder ([place]), Diabetic peripheral neuropathy associated with type 2 diabetes mellitus ([place]), Hyperlipidemia associated with type 2 diabetes mellitus ([place]), and Essential hypertension were also pertinent to this visit.  HPIfollow up [name] presents for 3 month follow up on diabetes.  Patient has no complaints today.  Patient is following a low glycemic index diet and taking all prescribed medications regularly without side effects.  Fasting sugars have been a little more elevated lately due to diet.  She has been eating more noodles and macaroni due to financial hardship and her fasting sugars have been over 120  most of the time and post prandials have been under 160 except on rare occasions. Patient is not exercising .  Patient has had an eye exam in the last 12 months and checks feet regularly for signs of infection.  Patient does not walk barefoot outside,  And she is up to date on all recommended vaccinations  Saw urology Oct 1 for kidney cancer and recurrent bladder cancer . Repeat Cysto planned in January   Taking a compounded nasal mist  From [facility] for sinus infection by Laura Reed ,  $65/ out of pocket ,  Ear drops for seven days  She has diabetic neuropathy and PAD and has had amputations to both feet and ulcers,  And sees Dr Cleda Mccreedy every 6 weeks  has a new  pressure ulcer on left 5th MT   Ran Out of xanax for the past 2 weeks  BS usually < 200   mostly 130   No lows.   Lab Results  Component Value Date   HGBA1C 7.6 (H) 06/05/2017   Lab Results  Component Value Date   MICROALBUR 8.3 (H) 09/30/2016     Outpatient Medications Prior to Visit  Medication Sig Dispense Refill  . ACCU-CHEK AVIVA PLUS test strip TEST BLOOD SUGAR 2 TO 3 TIMES DAILY 100 each 0  . ACCU-CHEK AVIVA PLUS test strip TEST BLOOD SUGAR 2 TO 3 TIMES DAILY 100 each 3  . acetaminophen (TYLENOL) 500 MG tablet Take 1,000 mg by mouth every 6 (six) hours as needed for mild pain or headache.     . AMITIZA 24 MCG capsule TAKE ONE CAPSULE BY MOUTH TWICE DAILY WITH A MEAL 60 capsule 0  . amLODipine (NORVASC) 5 MG tablet Take 1 tablet (5 mg total) by mouth daily. 90 tablet 3  . aspirin 81 MG chewable tablet Chew 81 mg by mouth daily.    Marland Kitchen atorvastatin (LIPITOR) 20 MG tablet TAKE 1 TABLET BY MOUTH EVERY DAY 90 tablet 3  . B-D ULTRAFINE III SHORT PEN 31G X 8 MM MISC TEST BLOOD SUGAR TWICE DAILY 100 each 0  . B-D ULTRAFINE III SHORT PEN 31G X 8 MM MISC TEST BLOOD SUGAR TWICE DAILY 100 each 0  . Cholecalciferol (VITAMIN D3) 2000 UNITS TABS Take 2,000 Units by mouth daily after supper.     Marland Kitchen CIPRODEX OTIC suspension INT 4 GTS AEA BID FOR 7 DAYS  10  . clopidogrel (PLAVIX) 75 MG tablet TAKE 1 TABLET BY MOUTH EVERY DAY 90 tablet 0  . DIGOX 125 MCG tablet TAKE 1 TABLET BY MOUTH EVERY  DAY (Patient taking differently: TAKE 1 TABLET BY MOUTH EVERY DAY IN THE EVENING) 90 tablet 3  . diphenhydrAMINE (BENADRYL) 25 mg capsule Take 25 mg by mouth at bedtime as needed for sleep.    Marland Kitchen gabapentin (NEURONTIN) 600 MG tablet TAKE 1 TABLET BY MOUTH THREE TIMES DAILY 270 tablet 1  . HUMALOG MIX 75/25 KWIKPEN (75-25) 100 UNIT/ML Kwikpen INJECT 30 UNITS UNDER THE SKIN EVERY MORNING AND 36 UNITS EVERY EVENING 30 mL 3  . Lactulose 20 GM/30ML SOLN 30 ml every 4 hours until constipation is relieved (Patient taking differently: Take 30 mLs by mouth See admin instructions. 30 ml every 4 hours until constipation is relieved) 236 mL 3  . LEVEMIR FLEXTOUCH 100 UNIT/ML Pen INJECT 40 UNITS UNDER THE SKIN EVERY DAY AT 10 PM 15 mL 0  . LORazepam (ATIVAN) 0.5 MG tablet  TAKE 1 TABLET BY MOUTH AT BEDTIME. MAY REPEAT IN 1 HOUR IF NEEDED 60 tablet 0  . mometasone (ELOCON) 0.1 % lotion Apply 1-2 application topically daily as needed (into ear canal for ear irriation).     . neomycin-polymyxin b-dexamethasone (MAXITROL) 3.5-10000-0.1 SUSP SHAKE LQ AND INT 1 GTT IN OD TID FOR 7 DAYS  0  . predniSONE (DELTASONE) 10 MG tablet 6 tablets on Day 1 , then reduce by 1 tablet daily until gone    . TRUEPLUS LANCETS 28G MISC CHECK BLOOD SUGAR FOUR TIMES DAILY 200 each 0  . ALPRAZolam (XANAX) 0.5 MG tablet Take 1 tablet (0.5 mg total) by mouth daily as needed. for anxiety 30 tablet 3   Facility-Administered Medications Prior to Visit  Medication Dose Route Frequency Provider Last Rate Last Dose  . lidocaine-EPINEPHrine (XYLOCAINE W/EPI) 2 %-1:100000 (with pres) injection 1.7 mL  1.7 mL Intradermal Once [name], MD        Review of Systems;  Patient denies headache, fevers, malaise, unintentional weight loss, skin rash, eye pain, sinus congestion and sinus pain, sore throat, dysphagia,  hemoptysis , cough, dyspnea, wheezing, chest pain, palpitations, orthopnea, edema, abdominal pain, nausea, melena, diarrhea, constipation, flank pain, dysuria, hematuria, urinary  Frequency, nocturia, numbness, tingling, seizures,  Focal weakness, Loss of consciousness,  Tremor, insomnia, depression, anxiety, and suicidal ideation.      Objective:  BP 140/64 (BP Location: Left Arm, Patient Position: Sitting, Cuff Size: Normal)   Pulse 76   Temp 98 F (36.7 C) (Oral)   Resp 16   Ht 5\' 7"  (1.702 m)   Wt 166 lb (75.3 kg)   SpO2 98%   BMI 26.00 kg/m   BP Readings from Last 3 Encounters:  06/05/17 140/64  06/04/17 (!) 90/57  04/07/17 115/68    Wt Readings from Last 3 Encounters:  06/05/17 166 lb (75.3 kg)  06/04/17 163 lb 9.6 oz (74.2 kg)  04/07/17 163 lb (73.9 kg)    General appearance: alert, cooperative and appears stated age Ears: normal TM's and external ear canals  both ears Throat: lips, mucosa, and tongue normal; teeth and gums normal Neck: no adenopathy, no carotid bruit, supple, symmetrical, trachea midline and thyroid not enlarged, symmetric, no tenderness/mass/nodules Back: symmetric, no curvature. ROM normal. No CVA tenderness. Lungs: clear to auscultation bilaterally Heart: regular rate and rhythm, S1, S2 normal, no murmur, click, rub or gallop Abdomen: soft, non-tender; bowel sounds normal; no masses,  no organomegaly Pulses: 2+ and symmetric Skin: Skin color, texture, turgor normal. No rashes or lesions Lymph nodes: Cervical, supraclavicular, and axillary nodes normal.  Lab Results  Component  Value Date   HGBA1C 7.6 (H) 06/05/2017   HGBA1C 7.4 (H) 01/28/2017   HGBA1C 7.5 (H) 09/30/2016    Lab Results  Component Value Date   CREATININE 1.09 06/05/2017   CREATININE 1.04 01/28/2017   CREATININE 0.99 12/09/2016    Lab Results  Component Value Date   WBC 10.5 02/18/2016   HGB 12.2 09/01/2016   HCT 36.0 09/01/2016   PLT 266.0 02/18/2016   GLUCOSE 182 (H) 06/05/2017   CHOL 123 06/05/2017   TRIG 97.0 06/05/2017   HDL 44.80 06/05/2017   LDLDIRECT 47.0 01/28/2017   LDLCALC 59 06/05/2017   ALT 19 06/05/2017   AST 17 06/05/2017   NA 136 06/05/2017   K 4.8 06/05/2017   CL 102 06/05/2017   CREATININE 1.09 06/05/2017   BUN 38 (H) 06/05/2017   CO2 29 06/05/2017   TSH 0.96 05/18/2015   INR 0.9 10/17/2014   HGBA1C 7.6 (H) 06/05/2017   MICROALBUR 8.3 (H) 09/30/2016    No results found.  Assessment & Plan:   Problem List Items Addressed This Visit    Atherosclerosis of native arteries of extremity with intermittent claudication ([place])   Relevant Orders   Lipid panel (Completed)   Controlled type 2 DM with peripheral circulatory disorder (HCC)    Loss of control has been persistent.  Dietary indiscretions due to financial hardship,  Insulin doses need adjustment .basd on carb counting    Lab Results  Component Value Date    HGBA1C 7.6 (H) 06/05/2017   Lab Results  Component Value Date   MICROALBUR 8.3 (H) 09/30/2016         Relevant Orders   Hemoglobin A1c (Completed)   Comprehensive metabolic panel (Completed)   Diabetic peripheral neuropathy associated with type 2 diabetes mellitus ([place])    She has PAD and has had multiple amputations due to pressure ulcers failing to heal.  Diabetic shoes are needed.        Relevant Medications   ALPRAZolam (XANAX) 0.5 MG tablet   Hyperlipidemia associated with type 2 diabetes mellitus ([place])    .Well controlled on current statin therapy.   Liver enzymes are normal , no changes today.  Lab Results  Component Value Date   CHOL 123 06/05/2017   HDL 44.80 06/05/2017   LDLCALC 59 06/05/2017   LDLDIRECT 47.0 01/28/2017   TRIG 97.0 06/05/2017   CHOLHDL 3 06/05/2017   Lab Results  Component Value Date   ALT 19 06/05/2017   AST 17 06/05/2017   ALKPHOS 80 06/05/2017   BILITOT 0.3 06/05/2017               Hypertension    Well controlled on current regimen. Renal function stable, no changes today.  Lab Results  Component Value Date   CREATININE 1.09 06/05/2017   Lab Results  Component Value Date   NA 136 06/05/2017   K 4.8 06/05/2017   CL 102 06/05/2017   CO2 29 06/05/2017          Other Visit Diagnoses    Diabetic polyneuropathy associated with type 2 diabetes mellitus ([place])    -  Primary   Relevant Medications   ALPRAZolam (XANAX) 0.5 MG tablet   Other Relevant Orders   DME Other see comment      I am having Laura Reed start on Zoster Vaccine Adjuvanted. I am also having her maintain her Vitamin D3, aspirin, Lactulose, HUMALOG MIX 75/25 KWIKPEN, B-D ULTRAFINE III SHORT PEN, TRUEPLUS LANCETS 28G, mometasone,  DIGOX, acetaminophen, amLODipine, atorvastatin, diphenhydrAMINE, LORazepam, ACCU-CHEK AVIVA PLUS, clopidogrel, neomycin-polymyxin b-dexamethasone, B-D ULTRAFINE III SHORT PEN, AMITIZA, ACCU-CHEK AVIVA PLUS, LEVEMIR FLEXTOUCH,  gabapentin, CIPRODEX, predniSONE, and ALPRAZolam. We will continue to administer lidocaine-EPINEPHrine.  Meds ordered this encounter  Medications  . Zoster Vaccine Adjuvanted [facility]) injection    Sig: Inject 0.5 mLs into the muscle once for 1 dose.    Dispense:  1 each    Refill:  1  . ALPRAZolam (XANAX) 0.5 MG tablet    Sig: Take 1 tablet (0.5 mg total) by mouth daily as needed. for anxiety    Dispense:  30 tablet    Refill:  5    Medications Discontinued During This Encounter  Medication Reason  . ALPRAZolam (XANAX) 0.5 MG tablet Reorder    Follow-up: Return in about 3 months (around 09/03/2017) for follow up diabetes.   [name], MD

## 2017-06-05 NOTE — Patient Instructions (Addendum)
You can increase your evening insulin dose by 2 units if you are having mac n cheese or mashed potatoes with your meal   I have written a letter to send to your insurance  explaining why you need to have diabetic shoes    You do not need a pneumonia vaccine until you are 65.    I have refilled your xanax for 6 months

## 2017-06-07 ENCOUNTER — Encounter: Payer: Self-pay | Admitting: Internal Medicine

## 2017-06-07 NOTE — Assessment & Plan Note (Addendum)
Loss of control has been persistent.  Dietary indiscretions due to financial hardship,  Insulin doses need adjustment .basd on carb counting    Lab Results  Component Value Date   HGBA1C 7.6 (H) 06/05/2017   Lab Results  Component Value Date   MICROALBUR 8.3 (H) 09/30/2016

## 2017-06-07 NOTE — Assessment & Plan Note (Signed)
She has PAD and has had multiple amputations due to pressure ulcers failing to heal.  Diabetic shoes are needed.

## 2017-06-07 NOTE — Assessment & Plan Note (Signed)
Well controlled on current regimen. Renal function stable, no changes today.  Lab Results  Component Value Date   CREATININE 1.09 06/05/2017   Lab Results  Component Value Date   NA 136 06/05/2017   K 4.8 06/05/2017   CL 102 06/05/2017   CO2 29 06/05/2017

## 2017-06-07 NOTE — Assessment & Plan Note (Signed)
.  Well controlled on current statin therapy.   Liver enzymes are normal , no changes today.  Lab Results  Component Value Date   CHOL 123 06/05/2017   HDL 44.80 06/05/2017   LDLCALC 59 06/05/2017   LDLDIRECT 47.0 01/28/2017   TRIG 97.0 06/05/2017   CHOLHDL 3 06/05/2017   Lab Results  Component Value Date   ALT 19 06/05/2017   AST 17 06/05/2017   ALKPHOS 80 06/05/2017   BILITOT 0.3 06/05/2017

## 2017-06-22 ENCOUNTER — Other Ambulatory Visit: Payer: Self-pay | Admitting: Internal Medicine

## 2017-06-22 NOTE — Telephone Encounter (Signed)
Refilled: 03/25/2017 Last OV: 06/05/2017 Next OV: 09/11/2017 Last Labs: 06/05/2017  Drop in GFR - 54.66

## 2017-07-03 ENCOUNTER — Other Ambulatory Visit: Payer: Self-pay | Admitting: Internal Medicine

## 2017-07-06 ENCOUNTER — Other Ambulatory Visit: Payer: Self-pay | Admitting: Internal Medicine

## 2017-07-09 ENCOUNTER — Other Ambulatory Visit: Payer: Medicare Other | Admitting: Urology

## 2017-07-09 DIAGNOSIS — E1140 Type 2 diabetes mellitus with diabetic neuropathy, unspecified: Secondary | ICD-10-CM | POA: Diagnosis not present

## 2017-07-16 ENCOUNTER — Other Ambulatory Visit: Payer: Medicare Other | Admitting: Urology

## 2017-07-21 ENCOUNTER — Ambulatory Visit (INDEPENDENT_AMBULATORY_CARE_PROVIDER_SITE_OTHER): Payer: Medicare Other | Admitting: Urology

## 2017-07-21 ENCOUNTER — Encounter: Payer: Self-pay | Admitting: Urology

## 2017-07-21 VITALS — BP 124/65 | HR 79 | Ht 67.0 in | Wt 170.0 lb

## 2017-07-21 DIAGNOSIS — C679 Malignant neoplasm of bladder, unspecified: Secondary | ICD-10-CM

## 2017-07-21 LAB — URINALYSIS, COMPLETE
Bilirubin, UA: NEGATIVE
Glucose, UA: NEGATIVE
Ketones, UA: NEGATIVE
NITRITE UA: NEGATIVE
PH UA: 5.5 (ref 5.0–7.5)
Protein, UA: NEGATIVE
Specific Gravity, UA: <1.005 — ABNORMAL LOW (ref 1.005–1.030)
Urobilinogen, Ur: 0.2 mg/dL (ref 0.2–1.0)

## 2017-07-21 LAB — MICROSCOPIC EXAMINATION

## 2017-07-21 MED ORDER — CIPROFLOXACIN HCL 500 MG PO TABS
500.0000 mg | ORAL_TABLET | Freq: Once | ORAL | Status: AC
  Administered 2017-07-21: 500 mg via ORAL

## 2017-07-21 MED ORDER — LIDOCAINE HCL 2 % EX GEL
1.0000 "application " | Freq: Once | CUTANEOUS | Status: AC
  Administered 2017-07-21: 1 via URETHRAL

## 2017-07-21 NOTE — Progress Notes (Signed)
   07/21/17  CC:  Chief Complaint  Patient presents with  . Cysto    HPI: 57 yo F with with history of pT1N0 left upper tract urothelial carcinoma s/p nephroU 4/016 with low grade Ta bladder recurrence s/p TURBT, mitomycin, right retrograde pyelogram on 2/18.    Last cystoscopy in 04/2017 showed some mild erythematous changes in the setting of recent UTI.  Urine cytology was negative.  UA today is fairly unremarkable.  No signs or symptoms of infection.  1- Left Upper Tract Transitional Cell Carcinoma/ Bladder cancer recurrence- s/p LEFT robotic nephroureterectomy on 10/31/2014 as well as combined ventral hernia repair with Dr. Bary Castilla. Post complicated by wound infection now well healed. Pathology pT1N0 with negative margins.  Recent Surveillance: 02/2015 - cysto NED; 05/2015 cysto NED, 08/2015- cysto NED, 11/2015- NED, CT Urogram 11/12/15 negative, poor quality of delayed phase.; 5/2-17- cysto with mild erythema following recent UTI; 02/2016 NED; 08/2016 Lg Ta TCC recurrence, 1 cm, multifocal. RTG neg.  08/2016- TURBT for bladder 1 cm recurrence near bladder neck and left UO, multifocal. R RTG negative. Mitomycin. LgTa. 10/2016- Induction BCG x 6.  2 - Solitary Kidney - s/p left nephrectomy as per above. Most recent Cr 1.04 01/2017.   She does have an extensive smoking history, quit 5years ago but smoked up to 2 packs a day for 35 years. She also has multiple medical comorbidities including history of diabetes, CAD status post CABG, carotid endarterectomy.     Blood pressure 124/65, pulse 79, height 5\' 7"  (1.702 m), weight 170 lb (77.1 kg). NED. A&Ox3.   No respiratory distress   Abd soft, NT, ND Normal external genitalia with patent urethral meatus  Cystoscopy Procedure Note  Patient identification was confirmed, informed consent was obtained, and patient was prepped using Betadine solution.  Lidocaine jelly was administered per urethral meatus.    Preoperative abx  where received prior to procedure.    Procedure: - Flexible cystoscope introduced, without any difficulty.   - Thorough search of the bladder revealed:    normal urethral meatus    normal urothelium with some changes on the posterior wall of the bladder most consistent with cystitis cystica.  No evidence of papillary tumor.  No areas of concern for CIS.    no stones    no ulcers     no tumors    no urethral polyps    no trabeculation  - Right UO normal. Left UO was surgically absent.  Post-Procedure: - Patient tolerated the procedure well  Assessment/ Plan:  1. Malignant neoplasm of urinary bladder, unspecified site (HCC) No evidence of disease today on cystoscopy We discussed maintenance BCG today as an option, risk and benefits discussed Recommend continuation of maintenance BCG on a schedule of 3 months, 6 months, 12 months, 18 months, 24 months, 30 months, and 36 months.  She is agreeable to this plan. - Urinalysis, Complete - ciprofloxacin (CIPRO) tablet 500 mg; Take 1 tablet (500 mg total) by mouth once. - lidocaine (XYLOCAINE) 2 % jelly 1 application; Place 1 application into the urethra once.    Return for BCG x 3 then cysto 3 months after complete.   Hollice Espy, MD

## 2017-07-23 DIAGNOSIS — L851 Acquired keratosis [keratoderma] palmaris et plantaris: Secondary | ICD-10-CM | POA: Diagnosis not present

## 2017-07-23 DIAGNOSIS — I739 Peripheral vascular disease, unspecified: Secondary | ICD-10-CM | POA: Diagnosis not present

## 2017-07-23 DIAGNOSIS — B351 Tinea unguium: Secondary | ICD-10-CM | POA: Diagnosis not present

## 2017-07-23 DIAGNOSIS — E1140 Type 2 diabetes mellitus with diabetic neuropathy, unspecified: Secondary | ICD-10-CM | POA: Diagnosis not present

## 2017-07-23 DIAGNOSIS — Z794 Long term (current) use of insulin: Secondary | ICD-10-CM | POA: Diagnosis not present

## 2017-07-28 ENCOUNTER — Other Ambulatory Visit: Payer: Self-pay | Admitting: Internal Medicine

## 2017-07-28 ENCOUNTER — Ambulatory Visit (INDEPENDENT_AMBULATORY_CARE_PROVIDER_SITE_OTHER): Payer: Medicare Other | Admitting: Urology

## 2017-07-28 ENCOUNTER — Encounter: Payer: Self-pay | Admitting: Urology

## 2017-07-28 VITALS — BP 139/76 | HR 75 | Ht 67.0 in | Wt 167.4 lb

## 2017-07-28 DIAGNOSIS — C679 Malignant neoplasm of bladder, unspecified: Secondary | ICD-10-CM

## 2017-07-28 LAB — MICROSCOPIC EXAMINATION: Bacteria, UA: NONE SEEN

## 2017-07-28 LAB — URINALYSIS, COMPLETE
Bilirubin, UA: NEGATIVE
GLUCOSE, UA: NEGATIVE
Ketones, UA: NEGATIVE
LEUKOCYTES UA: NEGATIVE
Nitrite, UA: NEGATIVE
PROTEIN UA: NEGATIVE
Specific Gravity, UA: 1.01 (ref 1.005–1.030)
Urobilinogen, Ur: 0.2 mg/dL (ref 0.2–1.0)
pH, UA: 6 (ref 5.0–7.5)

## 2017-07-28 MED ORDER — BCG LIVE 50 MG IS SUSR
3.2400 mL | Freq: Once | INTRAVESICAL | Status: AC
  Administered 2017-07-28: 81 mg via INTRAVESICAL

## 2017-07-28 MED ORDER — LANCETS MISC. MISC: 11 refills | Status: DC

## 2017-07-28 NOTE — Telephone Encounter (Signed)
Script filled.

## 2017-07-28 NOTE — Telephone Encounter (Signed)
Copied from [place]. Topic: Quick Communication - See Telephone Encounter >> Jul 28, 2017  8:32 AM Ether Griffins B wrote: CRM for notification. See Telephone encounter for:  Pt needing [facility] brand lancets called in  07/28/17.

## 2017-07-28 NOTE — Progress Notes (Signed)
BCG Bladder Instillation  BCG # 1 maint.  Due to Bladder Cancer patient is present today for a BCG treatment. Patient was cleaned and prepped in a sterile fashion with betadine and lidocaine 2% jelly was instilled into the urethra.  A 14FR catheter was inserted, urine return was noted 242ml, urine was yellow in color.  16ml of reconstituted BCG was instilled into the bladder. The catheter was then removed. Patient tolerated well, no complications were noted  Preformed by: Elberta Leatherwood, CMA, Hollice Espy, MD  Follow up/ Additional notes: 1 week #2

## 2017-08-04 ENCOUNTER — Ambulatory Visit: Payer: Medicare Other

## 2017-08-11 ENCOUNTER — Ambulatory Visit (INDEPENDENT_AMBULATORY_CARE_PROVIDER_SITE_OTHER): Payer: Medicare Other

## 2017-08-11 DIAGNOSIS — L97521 Non-pressure chronic ulcer of other part of left foot limited to breakdown of skin: Secondary | ICD-10-CM | POA: Diagnosis not present

## 2017-08-11 DIAGNOSIS — C679 Malignant neoplasm of bladder, unspecified: Secondary | ICD-10-CM | POA: Diagnosis not present

## 2017-08-11 DIAGNOSIS — Z794 Long term (current) use of insulin: Secondary | ICD-10-CM | POA: Diagnosis not present

## 2017-08-11 DIAGNOSIS — M2042 Other hammer toe(s) (acquired), left foot: Secondary | ICD-10-CM | POA: Diagnosis not present

## 2017-08-11 DIAGNOSIS — E1140 Type 2 diabetes mellitus with diabetic neuropathy, unspecified: Secondary | ICD-10-CM | POA: Diagnosis not present

## 2017-08-11 LAB — URINALYSIS, COMPLETE
Bilirubin, UA: NEGATIVE
Glucose, UA: NEGATIVE
Ketones, UA: NEGATIVE
Leukocytes, UA: NEGATIVE
NITRITE UA: NEGATIVE
PH UA: 5.5 (ref 5.0–7.5)
Protein, UA: NEGATIVE
RBC, UA: NEGATIVE
Specific Gravity, UA: <1.005 — ABNORMAL LOW (ref 1.005–1.030)
UUROB: 0.2 mg/dL (ref 0.2–1.0)

## 2017-08-11 LAB — MICROSCOPIC EXAMINATION
BACTERIA UA: NONE SEEN
RBC MICROSCOPIC, UA: NONE SEEN /HPF (ref 0–?)

## 2017-08-11 MED ORDER — BCG LIVE 50 MG IS SUSR
3.2400 mL | Freq: Once | INTRAVESICAL | Status: AC
  Administered 2017-08-11: 81 mg via INTRAVESICAL

## 2017-08-11 NOTE — Progress Notes (Signed)
BCG Bladder Instillation  BCG # 2  Due to Bladder Cancer patient is present today for a BCG treatment. Patient was cleaned and prepped in a sterile fashion with betadine and lidocaine 2% jelly was instilled into the urethra.  A 14FR catheter was inserted, urine return was noted 325ml, urine was yellow in color.  22ml of reconstituted BCG was instilled into the bladder. The catheter was then removed. Patient tolerated well, no complications were noted  Preformed by: Toniann Fail, LPN   Follow up/ Additional notes: due to pt missing last week appt pt will RTC next week for #3.

## 2017-08-11 NOTE — Addendum Note (Signed)
Addended by: Toniann Fail C on: 08/11/2017 12:01 PM   Modules accepted: Orders

## 2017-08-18 ENCOUNTER — Ambulatory Visit (INDEPENDENT_AMBULATORY_CARE_PROVIDER_SITE_OTHER): Payer: Medicare Other

## 2017-08-18 DIAGNOSIS — C679 Malignant neoplasm of bladder, unspecified: Secondary | ICD-10-CM | POA: Diagnosis not present

## 2017-08-18 LAB — URINALYSIS, COMPLETE
Bilirubin, UA: NEGATIVE
Glucose, UA: NEGATIVE
KETONES UA: NEGATIVE
LEUKOCYTES UA: NEGATIVE
NITRITE UA: NEGATIVE
PH UA: 6.5 (ref 5.0–7.5)
Protein, UA: NEGATIVE
Specific Gravity, UA: 1.01 (ref 1.005–1.030)
Urobilinogen, Ur: 0.2 mg/dL (ref 0.2–1.0)

## 2017-08-18 LAB — MICROSCOPIC EXAMINATION

## 2017-08-18 MED ORDER — BCG LIVE 50 MG IS SUSR
3.2400 mL | Freq: Once | INTRAVESICAL | Status: AC
  Administered 2017-08-18: 81 mg via INTRAVESICAL

## 2017-08-18 NOTE — Progress Notes (Signed)
BCG Bladder Instillation  BCG # 3  Due to Bladder Cancer patient is present today for a BCG treatment. Patient was cleaned and prepped in a sterile fashion with betadine and lidocaine 2% jelly was instilled into the urethra.  A 14FR catheter was inserted, urine return was noted 32ml, urine was yellow in color.  93ml of reconstituted BCG was instilled into the bladder. The catheter was then removed. Patient tolerated well, no complications were noted  Preformed by: Toniann Fail, LPN

## 2017-08-26 ENCOUNTER — Other Ambulatory Visit: Payer: Self-pay | Admitting: Internal Medicine

## 2017-09-03 DIAGNOSIS — L97511 Non-pressure chronic ulcer of other part of right foot limited to breakdown of skin: Secondary | ICD-10-CM | POA: Diagnosis not present

## 2017-09-08 ENCOUNTER — Other Ambulatory Visit: Payer: Self-pay | Admitting: Internal Medicine

## 2017-09-08 DIAGNOSIS — I739 Peripheral vascular disease, unspecified: Secondary | ICD-10-CM | POA: Diagnosis not present

## 2017-09-08 DIAGNOSIS — I25810 Atherosclerosis of coronary artery bypass graft(s) without angina pectoris: Secondary | ICD-10-CM | POA: Diagnosis not present

## 2017-09-08 DIAGNOSIS — E782 Mixed hyperlipidemia: Secondary | ICD-10-CM | POA: Diagnosis not present

## 2017-09-11 ENCOUNTER — Encounter: Payer: Self-pay | Admitting: Internal Medicine

## 2017-09-11 ENCOUNTER — Ambulatory Visit (INDEPENDENT_AMBULATORY_CARE_PROVIDER_SITE_OTHER): Payer: Medicare Other | Admitting: Internal Medicine

## 2017-09-11 VITALS — BP 152/74 | HR 60 | Temp 97.9°F | Resp 15 | Ht 67.0 in | Wt 168.6 lb

## 2017-09-11 DIAGNOSIS — E1151 Type 2 diabetes mellitus with diabetic peripheral angiopathy without gangrene: Secondary | ICD-10-CM

## 2017-09-11 DIAGNOSIS — T50905A Adverse effect of unspecified drugs, medicaments and biological substances, initial encounter: Secondary | ICD-10-CM | POA: Diagnosis not present

## 2017-09-11 DIAGNOSIS — I70213 Atherosclerosis of native arteries of extremities with intermittent claudication, bilateral legs: Secondary | ICD-10-CM

## 2017-09-11 DIAGNOSIS — R001 Bradycardia, unspecified: Secondary | ICD-10-CM | POA: Diagnosis not present

## 2017-09-11 LAB — COMPREHENSIVE METABOLIC PANEL
ALBUMIN: 4 g/dL (ref 3.5–5.2)
ALT: 18 U/L (ref 0–35)
AST: 15 U/L (ref 0–37)
Alkaline Phosphatase: 79 U/L (ref 39–117)
BUN: 25 mg/dL — AB (ref 6–23)
CALCIUM: 9.4 mg/dL (ref 8.4–10.5)
CHLORIDE: 104 meq/L (ref 96–112)
CO2: 28 mEq/L (ref 19–32)
Creatinine, Ser: 0.89 mg/dL (ref 0.40–1.20)
GFR: 69 mL/min (ref >60.00)
Glucose, Bld: 157 mg/dL — ABNORMAL HIGH (ref 70–99)
POTASSIUM: 4.3 meq/L (ref 3.5–5.1)
SODIUM: 138 meq/L (ref 135–145)
Total Bilirubin: 0.4 mg/dL (ref 0.2–1.2)
Total Protein: 7 g/dL (ref 6.0–8.3)

## 2017-09-11 LAB — HEMOGLOBIN A1C: Hgb A1c MFr Bld: 6.8 % — ABNORMAL HIGH (ref 4.6–6.5)

## 2017-09-11 LAB — MICROALBUMIN / CREATININE URINE RATIO
CREATININE, U: 12 mg/dL
MICROALB UR: 6.6 mg/dL — AB (ref 0.0–1.9)
Microalb Creat Ratio: 55.1 mg/g — ABNORMAL HIGH (ref 0.0–30.0)

## 2017-09-11 NOTE — Progress Notes (Signed)
[id number] Subjective:  Patient ID: Laura Reed, female    DOB: 1960/11/12  Age: 57 y.o. MRN: [id number]  CC: The primary encounter diagnosis was Bradycardia. Diagnoses of Controlled type 2 DM with peripheral circulatory disorder ([place]), Bradycardia, drug induced, and Atherosclerosis of native artery of both lower extremities with intermittent claudication ([place]) were also pertinent to this visit.  HPI Laura Reed presents for 3 month follow up on diabetes, hypertension, and hyperlipidemia.  Patient has no complaints today.     Patient is following a low glycemic index diet and taking all prescribed medications regularly with a recent episode of hypoglycemia .   Had a BS of 24 at the end of December which she believes was due to  incorrect dosing of evening insulin  Due to limited vision and use of the " quick pen" (she is using 36 units in the evening and 30 units in the morning).    Fasting sugars have been under less than 140 most of the time and post prandials have been under 160 except on rare occasions. Patient is exercising about 3 times per week and intentionally trying to lose weight .  Patient has diabetic retinopathy with diminished visual capacity and has had multiple amputations of toes due to severe PAD.  She checks feet regularly for signs of infection.  Patient does not walk barefoot outside.  Patient is up to date on all recommended vaccinations  Had 3 bladder  BCG treatments for management of bladder cancer.  Her next  cysto is in May  Lab Results  Component Value Date   MICROALBUR 6.6 (H) 09/11/2017   Sees [name] In May for follow up on PAD   Outpatient Medications Prior to Visit  Medication Sig Dispense Refill  . ACCU-CHEK AVIVA PLUS test strip TEST BLOOD SUGAR 2 TO 3 TIMES DAILY 100 each 0  . ACCU-CHEK AVIVA PLUS test strip TEST BLOOD SUGAR 2 TO 3 TIMES DAILY 100 each 3  . acetaminophen (TYLENOL) 500 MG tablet Take 1,000 mg by mouth every 6 (six) hours as  needed for mild pain or headache.     . ALPRAZolam (XANAX) 0.5 MG tablet Take 1 tablet (0.5 mg total) by mouth daily as needed. for anxiety 30 tablet 5  . AMITIZA 24 MCG capsule TAKE ONE CAPSULE BY MOUTH TWICE DAILY WITH A MEAL 60 capsule 0  . amLODipine (NORVASC) 5 MG tablet TAKE 1 TABLET(5 MG) BY MOUTH DAILY 90 tablet 1  . aspirin 81 MG chewable tablet Chew 81 mg by mouth daily.    Marland Kitchen atorvastatin (LIPITOR) 20 MG tablet TAKE 1 TABLET BY MOUTH EVERY DAY 90 tablet 3  . B-D ULTRAFINE III SHORT PEN 31G X 8 MM MISC TEST BLOOD SUGAR TWICE DAILY 100 each 0  . B-D ULTRAFINE III SHORT PEN 31G X 8 MM MISC TEST BLOOD SUGAR TWICE DAILY 100 each 0  . Cholecalciferol (VITAMIN D3) 2000 UNITS TABS Take 2,000 Units by mouth daily after supper.     . clopidogrel (PLAVIX) 75 MG tablet TAKE 1 TABLET BY MOUTH EVERY DAY 90 tablet 0  . DIGOX 125 MCG tablet TAKE 1 TABLET BY MOUTH EVERY DAY 90 tablet 1  . diphenhydrAMINE (BENADRYL) 25 mg capsule Take 25 mg by mouth at bedtime as needed for sleep.    Marland Kitchen gabapentin (NEURONTIN) 600 MG tablet TAKE 1 TABLET BY MOUTH THREE TIMES DAILY 270 tablet 1  . HUMALOG MIX 75/25 KWIKPEN (75-25) 100 UNIT/ML Kwikpen INJECT 30 UNITS  UNDER THE SKIN EVERY MORNING AND 36 UNITS EVERY EVENING 30 mL 3  . Lactulose 20 GM/30ML SOLN 30 ml every 4 hours until constipation is relieved (Patient taking differently: Take 30 mLs by mouth See admin instructions. 30 ml every 4 hours until constipation is relieved) 236 mL 3  . Lancets Misc. MISC Use to check blood sugars 3 times daily 100 each 11  . LEVEMIR FLEXTOUCH 100 UNIT/ML Pen INJECT 40 UNITS UNDER THE SKIN EVERY DAY AT 10 PM 15 mL 2  . LORazepam (ATIVAN) 0.5 MG tablet TAKE 1 TABLET BY MOUTH AT BEDTIME. MAY REPEAT IN 1 HOUR IF NEEDED 60 tablet 0  . mometasone (ELOCON) 0.1 % lotion Apply 1-2 application topically daily as needed (into ear canal for ear irriation).     . neomycin-polymyxin b-dexamethasone (MAXITROL) 3.5-10000-0.1 SUSP SHAKE LQ AND INT 1  GTT IN OD TID FOR 7 DAYS  0  . TRUEPLUS LANCETS 28G MISC CHECK BLOOD SUGAR FOUR TIMES DAILY 200 each 0  . TRUEPLUS LANCETS 28G MISC CHECK BLOOD SUGAR FOUR TIMES DAILY 200 each 0  . CIPRODEX OTIC suspension INT 4 GTS AEA BID FOR 7 DAYS  10  . predniSONE (DELTASONE) 10 MG tablet 6 tablets on Day 1 , then reduce by 1 tablet daily until gone     Facility-Administered Medications Prior to Visit  Medication Dose Route Frequency Provider Last Rate Last Dose  . lidocaine-EPINEPHrine (XYLOCAINE W/EPI) 2 %-1:100000 (with pres) injection 1.7 mL  1.7 mL Intradermal Once [name], MD        Review of Systems;  Patient denies headache, fevers, malaise, unintentional weight loss, skin rash, eye pain, sinus congestion and sinus pain, sore throat, dysphagia,  hemoptysis , cough, dyspnea, wheezing, chest pain, palpitations, orthopnea, edema, abdominal pain, nausea, melena, diarrhea, constipation, flank pain, dysuria, hematuria, urinary  Frequency, nocturia, numbness, tingling, seizures,  Focal weakness, Loss of consciousness,  Tremor, insomnia, depression, anxiety, and suicidal ideation.      Objective:  BP (!) 152/74 (BP Location: Left Arm, Patient Position: Sitting, Cuff Size: Normal)   Pulse 60   Temp 97.9 F (36.6 C) (Oral)   Resp 15   Ht 5\' 7"  (1.702 m)   Wt 168 lb 9.6 oz (76.5 kg)   SpO2 99%   BMI 26.41 kg/m   BP Readings from Last 3 Encounters:  09/11/17 (!) 152/74  07/28/17 139/76  07/21/17 124/65    Wt Readings from Last 3 Encounters:  09/11/17 168 lb 9.6 oz (76.5 kg)  07/28/17 167 lb 6.4 oz (75.9 kg)  07/21/17 170 lb (77.1 kg)    General appearance: alert, cooperative and appears stated age Ears: normal TM's and external ear canals both ears Throat: lips, mucosa, and tongue normal; teeth and gums normal Neck: no adenopathy, no carotid bruit, supple, symmetrical, trachea midline and thyroid not enlarged, symmetric, no tenderness/mass/nodules Back: symmetric, no curvature.  ROM normal. No CVA tenderness. Lungs: clear to auscultation bilaterally Heart: regular rate and rhythm, S1, S2 normal, no murmur, click, rub or gallop Abdomen: soft, non-tender; bowel sounds normal; no masses,  no organomegaly Pulses: 2+ and symmetric Skin: Skin color, texture, turgor normal. No rashes or lesions Lymph nodes: Cervical, supraclavicular, and axillary nodes normal.  Lab Results  Component Value Date   HGBA1C 6.8 (H) 09/11/2017   HGBA1C 7.6 (H) 06/05/2017   HGBA1C 7.4 (H) 01/28/2017    Lab Results  Component Value Date   CREATININE 0.89 09/11/2017   CREATININE 1.09 06/05/2017  CREATININE 1.04 01/28/2017     Assessment & Plan:   Problem List Items Addressed This Visit    Atherosclerosis of native arteries of extremity with intermittent claudication ([place])    She has had multiple interventions and is seen regularly by Dr Delana Meyer for maintenance of patent left iliac bypass.  ABIS have recently improved and are both > 0.9         Bradycardia, drug induced    Intermittent,  Suspected secondary to use of digoxin for unclear reasons.  She has no history of atrial fibrillation of reduced EF.  Digoxin level was checked today to rule out supratherapeutic level and was actually low.    Lab Results  Component Value Date   DIGOXIN <0.5 (L) 09/11/2017        Controlled type 2 DM with peripheral circulatory disorder (HCC)    Well controlled on current regimen.  Her Recent episode of hypoglycemia was likely due to limited vision resulting in incorrect dosing of insulin.  Son has been moreinstrumental in teaching her how to use the pen.  Recommend getting a table top magnifying glass to use to view the hashmarks on the pen.  Reviewed the most rapidly available  sources of sugar .  Lab Results  Component Value Date   HGBA1C 6.8 (H) 09/11/2017         Relevant Orders   Hemoglobin A1c (Completed)   Comprehensive metabolic panel (Completed)   Microalbumin / creatinine  urine ratio (Completed)    Other Visit Diagnoses    Bradycardia    -  Primary   Relevant Orders   EKG 12-Lead   Digoxin level (Completed)      I have discontinued Laura Reed's CIPRODEX and predniSONE. I am also having her maintain her Vitamin D3, aspirin, Lactulose, HUMALOG MIX 75/25 KWIKPEN, B-D ULTRAFINE III SHORT PEN, TRUEPLUS LANCETS 28G, mometasone, acetaminophen, atorvastatin, diphenhydrAMINE, LORazepam, ACCU-CHEK AVIVA PLUS, neomycin-polymyxin b-dexamethasone, AMITIZA, ACCU-CHEK AVIVA PLUS, gabapentin, ALPRAZolam, clopidogrel, LEVEMIR FLEXTOUCH, TRUEPLUS LANCETS 28G, Lancets Misc., DIGOX, amLODipine, and B-D ULTRAFINE III SHORT PEN. We will continue to administer lidocaine-EPINEPHrine.  No orders of the defined types were placed in this encounter.   Medications Discontinued During This Encounter  Medication Reason  . CIPRODEX OTIC suspension Completed Course  . predniSONE (DELTASONE) 10 MG tablet Completed Course    Follow-up: Return in about 3 months (around 12/12/2017) for follow up diabetes.   [name], MD

## 2017-09-11 NOTE — Patient Instructions (Signed)
To treat a low blood sugar,  You need:  A simple sugar (banana, orange,  Fruit juice)   Along with a starch/protein (peanutbutter sandwich or cracker)  Consider getting a table top magnifying glass  To help you use your pens safely    Hypoglycemia Hypoglycemia is when the sugar (glucose) level in the blood is too low. Symptoms of low blood sugar may include:  Feeling: ? Hungry. ? Worried or nervous (anxious). ? Sweaty and clammy. ? Confused. ? Dizzy. ? Sleepy. ? Sick to your stomach (nauseous).  Having: ? A fast heartbeat. ? A headache. ? A change in your vision. ? Jerky movements that you cannot control (seizure). ? Nightmares. ? Tingling or no feeling (numbness) around the mouth, lips, or tongue.  Having trouble with: ? Talking. ? Paying attention (concentrating). ? Moving (coordination). ? Sleeping.  Shaking.  Passing out (fainting).  Getting upset easily (irritability).  Low blood sugar can happen to people who have diabetes and people who do not have diabetes. Low blood sugar can happen quickly, and it can be an emergency. Treating Low Blood Sugar Low blood sugar is often treated by eating or drinking something sugary right away. If you can think clearly and swallow safely, follow the 15:15 rule:  Take 15 grams of a fast-acting carb (carbohydrate). Some fast-acting carbs are: ? 1 tube of glucose gel. ? 3 sugar tablets (glucose pills). ? 6-8 pieces of hard candy. ? 4 oz (120 mL) of fruit juice. ? 4 oz (120 mL) of regular (not diet) soda.  Check your blood sugar 15 minutes after you take the carb.  If your blood sugar is still at or below 70 mg/dL (3.9 mmol/L), take 15 grams of a carb again.  If your blood sugar does not go above 70 mg/dL (3.9 mmol/L) after 3 tries, get help right away.  After your blood sugar goes back to normal, eat a meal or a snack within 1 hour.  Treating Very Low Blood Sugar If your blood sugar is at or below 54 mg/dL (3 mmol/L),  you have very low blood sugar (severe hypoglycemia). This is an emergency. Do not wait to see if the symptoms will go away. Get medical help right away. Call your local emergency services (911 in the [place] Do not drive yourself to the hospital. If you have very low blood sugar and you cannot eat or drink, you may need a glucagon shot (injection). A family member or friend should learn how to check your blood sugar and how to give you a glucagon shot. Ask your doctor if you need to have a glucagon shot kit at home. Follow these instructions at home: General instructions  Avoid any diets that cause you to not eat enough food. Talk with your doctor before you start any new diet.  Take over-the-counter and prescription medicines only as told by your doctor.  Limit alcohol to no more than 1 drink per day for nonpregnant women and 2 drinks per day for men. One drink equals 12 oz of beer, 5 oz of wine, or 1 oz of hard liquor.  Keep all follow-up visits as told by your doctor. This is important. If You Have Diabetes:   Make sure you know the symptoms of low blood sugar.  Always keep a source of sugar with you, such as: ? Sugar. ? Sugar tablets. ? Glucose gel. ? Fruit juice. ? Regular soda (not diet soda). ? Milk. ? Hard candy. ? Honey.  Take your  medicines as told.  Follow your exercise and meal plan. ? Eat on time. Do not skip meals. ? Follow your sick day plan when you cannot eat or drink normally. Make this plan ahead of time with your doctor.  Check your blood sugar as often as told by your doctor. Always check before and after exercise.  Share your diabetes care plan with: ? Your work or school. ? People you live with.  Check your pee (urine) for ketones: ? When you are sick. ? As told by your doctor.  Carry a card or wear jewelry that says you have diabetes. If You Have Low Blood Sugar From Other Causes:   Check your blood sugar as often as told by your  doctor.  Follow instructions from your doctor about what you cannot eat or drink. Contact a doctor if:  You have trouble keeping your blood sugar in your target range.  You have low blood sugar often. Get help right away if:  You still have symptoms after you eat or drink something sugary.  Your blood sugar is at or below 54 mg/dL (3 mmol/L).  You have jerky movements that you cannot control.  You pass out. These symptoms may be an emergency. Do not wait to see if the symptoms will go away. Get medical help right away. Call your local emergency services (911 in the [place] Do not drive yourself to the hospital. This information is not intended to replace advice given to you by your health care provider. Make sure you discuss any questions you have with your health care provider. Document Released: 09/17/2009 Document Revised: 11/29/2015 Document Reviewed: 07/27/2015 Elsevier Interactive Patient Education  [employer].

## 2017-09-12 LAB — DIGOXIN LEVEL: Digoxin Level: <0.5 mcg/L — ABNORMAL LOW (ref 0.8–2.0)

## 2017-09-13 DIAGNOSIS — R001 Bradycardia, unspecified: Secondary | ICD-10-CM | POA: Insufficient documentation

## 2017-09-13 DIAGNOSIS — T50905A Adverse effect of unspecified drugs, medicaments and biological substances, initial encounter: Secondary | ICD-10-CM

## 2017-09-13 NOTE — Assessment & Plan Note (Signed)
Well controlled on current regimen.  Her Recent episode of hypoglycemia was likely due to limited vision resulting in incorrect dosing of insulin.  Son has been moreinstrumental in teaching her how to use the pen.  Recommend getting a table top magnifying glass to use to view the hashmarks on the pen.  Reviewed the most rapidly available  sources of sugar .  Lab Results  Component Value Date   HGBA1C 6.8 (H) 09/11/2017

## 2017-09-13 NOTE — Assessment & Plan Note (Signed)
She has had multiple interventions and is seen regularly by Dr Delana Meyer for maintenance of patent left iliac bypass.  ABIS have recently improved and are both > 0.9

## 2017-09-13 NOTE — Assessment & Plan Note (Addendum)
Intermittent,  Suspected secondary to use of digoxin for unclear reasons.  She has no history of atrial fibrillation of reduced EF.  Digoxin level was checked today to rule out supratherapeutic level and was actually low.    Lab Results  Component Value Date   DIGOXIN <0.5 (L) 09/11/2017

## 2017-09-14 ENCOUNTER — Other Ambulatory Visit: Payer: Self-pay | Admitting: Internal Medicine

## 2017-09-14 DIAGNOSIS — Z1231 Encounter for screening mammogram for malignant neoplasm of breast: Secondary | ICD-10-CM

## 2017-09-24 ENCOUNTER — Other Ambulatory Visit: Payer: Self-pay | Admitting: Internal Medicine

## 2017-09-25 ENCOUNTER — Other Ambulatory Visit: Payer: Self-pay | Admitting: Internal Medicine

## 2017-09-25 ENCOUNTER — Ambulatory Visit
Admission: RE | Admit: 2017-09-25 | Discharge: 2017-09-25 | Disposition: A | Discharge status: Home / Self Care | Payer: Medicare Other | Source: Ambulatory Visit | Attending: Internal Medicine | Admitting: Internal Medicine

## 2017-09-25 DIAGNOSIS — R928 Other abnormal and inconclusive findings on diagnostic imaging of breast: Secondary | ICD-10-CM | POA: Diagnosis not present

## 2017-09-25 DIAGNOSIS — Z1231 Encounter for screening mammogram for malignant neoplasm of breast: Secondary | ICD-10-CM | POA: Diagnosis not present

## 2017-09-28 ENCOUNTER — Other Ambulatory Visit: Payer: Self-pay | Admitting: Internal Medicine

## 2017-09-28 DIAGNOSIS — N6310 Unspecified lump in the right breast, unspecified quadrant: Secondary | ICD-10-CM

## 2017-09-28 DIAGNOSIS — R928 Other abnormal and inconclusive findings on diagnostic imaging of breast: Secondary | ICD-10-CM

## 2017-10-05 ENCOUNTER — Other Ambulatory Visit: Payer: Self-pay | Admitting: Internal Medicine

## 2017-10-09 ENCOUNTER — Ambulatory Visit
Admission: RE | Admit: 2017-10-09 | Discharge: 2017-10-09 | Disposition: A | Discharge status: Home / Self Care | Payer: Medicare Other | Source: Ambulatory Visit | Attending: Internal Medicine | Admitting: Internal Medicine

## 2017-10-09 DIAGNOSIS — R928 Other abnormal and inconclusive findings on diagnostic imaging of breast: Secondary | ICD-10-CM | POA: Diagnosis not present

## 2017-10-09 DIAGNOSIS — N6310 Unspecified lump in the right breast, unspecified quadrant: Secondary | ICD-10-CM

## 2017-10-09 DIAGNOSIS — N6489 Other specified disorders of breast: Secondary | ICD-10-CM | POA: Diagnosis not present

## 2017-10-15 DIAGNOSIS — B351 Tinea unguium: Secondary | ICD-10-CM | POA: Diagnosis not present

## 2017-10-15 DIAGNOSIS — I739 Peripheral vascular disease, unspecified: Secondary | ICD-10-CM | POA: Diagnosis not present

## 2017-10-15 DIAGNOSIS — L97521 Non-pressure chronic ulcer of other part of left foot limited to breakdown of skin: Secondary | ICD-10-CM | POA: Diagnosis not present

## 2017-10-15 DIAGNOSIS — L97511 Non-pressure chronic ulcer of other part of right foot limited to breakdown of skin: Secondary | ICD-10-CM | POA: Diagnosis not present

## 2017-10-15 DIAGNOSIS — E1140 Type 2 diabetes mellitus with diabetic neuropathy, unspecified: Secondary | ICD-10-CM | POA: Diagnosis not present

## 2017-10-22 DIAGNOSIS — L97521 Non-pressure chronic ulcer of other part of left foot limited to breakdown of skin: Secondary | ICD-10-CM | POA: Diagnosis not present

## 2017-10-22 DIAGNOSIS — L97511 Non-pressure chronic ulcer of other part of right foot limited to breakdown of skin: Secondary | ICD-10-CM | POA: Diagnosis not present

## 2017-11-06 ENCOUNTER — Telehealth: Payer: Self-pay

## 2017-11-06 NOTE — Telephone Encounter (Signed)
I did not call the patient regarding an EKG, however I did return the call to get clarity on why she was leaving this message.  No answer.  Left a message on the voicemail stating her message has been received in the chart and she is welcome to call back if there is something she believes we can do to assist in some way.

## 2017-11-06 NOTE — Telephone Encounter (Signed)
Copied from [place] [id number]. Topic: Quick Communication - Office Called Patient >> Nov 06, 2017  1:15 PM Yvette Rack wrote: Reason for CRM: patient states that Denisa had called her about a EKG she states that her heart doctor Dr Ubaldo Glassing did one on her in February or March of this year

## 2017-11-10 ENCOUNTER — Other Ambulatory Visit: Payer: Medicare Other | Admitting: Urology

## 2017-11-11 DIAGNOSIS — L97511 Non-pressure chronic ulcer of other part of right foot limited to breakdown of skin: Secondary | ICD-10-CM | POA: Diagnosis not present

## 2017-11-11 DIAGNOSIS — L97521 Non-pressure chronic ulcer of other part of left foot limited to breakdown of skin: Secondary | ICD-10-CM | POA: Diagnosis not present

## 2017-11-12 ENCOUNTER — Other Ambulatory Visit: Payer: Medicare Other | Admitting: Urology

## 2017-11-18 ENCOUNTER — Telehealth (INDEPENDENT_AMBULATORY_CARE_PROVIDER_SITE_OTHER): Payer: Self-pay | Admitting: Vascular Surgery

## 2017-11-18 NOTE — Telephone Encounter (Signed)
Left voice message informing patient to call and reschedule her appointment on 12/14/17 because [place] tech not available.

## 2017-11-19 ENCOUNTER — Other Ambulatory Visit: Payer: Self-pay | Admitting: Internal Medicine

## 2017-11-28 ENCOUNTER — Other Ambulatory Visit: Payer: Self-pay | Admitting: Internal Medicine

## 2017-12-10 DIAGNOSIS — Z794 Long term (current) use of insulin: Secondary | ICD-10-CM | POA: Diagnosis not present

## 2017-12-10 DIAGNOSIS — I739 Peripheral vascular disease, unspecified: Secondary | ICD-10-CM | POA: Diagnosis not present

## 2017-12-10 DIAGNOSIS — L97521 Non-pressure chronic ulcer of other part of left foot limited to breakdown of skin: Secondary | ICD-10-CM | POA: Diagnosis not present

## 2017-12-10 DIAGNOSIS — E1140 Type 2 diabetes mellitus with diabetic neuropathy, unspecified: Secondary | ICD-10-CM | POA: Diagnosis not present

## 2017-12-10 DIAGNOSIS — L97511 Non-pressure chronic ulcer of other part of right foot limited to breakdown of skin: Secondary | ICD-10-CM | POA: Diagnosis not present

## 2017-12-12 ENCOUNTER — Other Ambulatory Visit: Payer: Self-pay | Admitting: Internal Medicine

## 2017-12-14 ENCOUNTER — Encounter (INDEPENDENT_AMBULATORY_CARE_PROVIDER_SITE_OTHER): Payer: Medicare Other

## 2017-12-14 ENCOUNTER — Ambulatory Visit (INDEPENDENT_AMBULATORY_CARE_PROVIDER_SITE_OTHER): Payer: Medicare Other | Admitting: Vascular Surgery

## 2017-12-16 ENCOUNTER — Encounter: Payer: Self-pay | Admitting: Urology

## 2017-12-16 ENCOUNTER — Ambulatory Visit (INDEPENDENT_AMBULATORY_CARE_PROVIDER_SITE_OTHER): Payer: Medicare Other | Admitting: Urology

## 2017-12-16 VITALS — BP 148/88 | HR 69 | Resp 16 | Ht 67.0 in | Wt 164.0 lb

## 2017-12-16 DIAGNOSIS — Z8551 Personal history of malignant neoplasm of bladder: Secondary | ICD-10-CM

## 2017-12-16 DIAGNOSIS — C679 Malignant neoplasm of bladder, unspecified: Secondary | ICD-10-CM | POA: Diagnosis not present

## 2017-12-16 DIAGNOSIS — R319 Hematuria, unspecified: Secondary | ICD-10-CM | POA: Diagnosis not present

## 2017-12-16 LAB — URINALYSIS, COMPLETE
Bilirubin, UA: NEGATIVE
KETONES UA: NEGATIVE
LEUKOCYTES UA: NEGATIVE
Nitrite, UA: NEGATIVE
Protein, UA: NEGATIVE
Specific Gravity, UA: <1.005 — ABNORMAL LOW (ref 1.005–1.030)
Urobilinogen, Ur: 0.2 mg/dL (ref 0.2–1.0)
pH, UA: 5.5 (ref 5.0–7.5)

## 2017-12-16 MED ORDER — LIDOCAINE HCL URETHRAL/MUCOSAL 2 % EX GEL
1.0000 "application " | Freq: Once | CUTANEOUS | Status: AC
  Administered 2017-12-16: 1 via URETHRAL

## 2017-12-16 MED ORDER — CIPROFLOXACIN HCL 500 MG PO TABS
500.0000 mg | ORAL_TABLET | Freq: Once | ORAL | Status: AC
  Administered 2017-12-16: 500 mg via ORAL

## 2017-12-16 NOTE — Progress Notes (Signed)
   12/16/17  CC:  Chief Complaint  Patient presents with  . Cysto    HPI: 57 yo F with with history of pT1N0 left upper tract urothelial carcinoma s/p nephroU 4/016 with low grade Ta bladder recurrence s/p TURBT, mitomycin, right retrograde pyelogram on 2/18.    UA today is fairly unremarkable.  No signs or symptoms of infection.  1- Left Upper Tract Transitional Cell Carcinoma/ Bladder cancer recurrence- s/p LEFT robotic nephroureterectomy on 10/31/2014 as well as combined ventral hernia repair with Dr. Bary Castilla. Post complicated by wound infection now well healed. Pathology pT1N0 with negative margins.  Recent Surveillance: 02/2015 - cysto NED; 05/2015 cysto NED, 08/2015- cysto NED, 11/2015- NED, CT Urogram 11/12/15 negative, poor quality of delayed phase.; 5/2-17- cysto with mild erythema following recent UTI; 02/2016 NED; 08/2016 Lg Ta TCC recurrence, 1 cm, multifocal. RTG neg.  08/2016- TURBT for bladder 1 cm recurrence near bladder neck and left UO, multifocal. R RTG negative. Mitomycin. LgTa. 10/2016- Induction BCG x 6 08/2017- Maintenance BCG x 3  2 - Solitary Kidney - s/p left nephrectomy as per above. Most recent Cr 0.89 09/2017  She does have an extensive smoking history, quit 5years ago but smoked up to 2 packs a day for 35 years. She also has multiple medical comorbidities including history of diabetes, CAD status post CABG, carotid endarterectomy.    NED. A&Ox3.   No respiratory distress   Abd soft, NT, ND Normal external genitalia with patent urethral meatus  Cystoscopy Procedure Note  Patient identification was confirmed, informed consent was obtained, and patient was prepped using Betadine solution.  Lidocaine jelly was administered per urethral meatus.    Preoperative abx where received prior to procedure.    Procedure: - Flexible cystoscope introduced, without any difficulty.   - Thorough search of the bladder revealed:    normal urethral meatus  normal urothelium with some changes on the posterior wall of the bladder most consistent with cystitis cystica (unchanged).  No evidence of papillary tumor.  No areas of concern for CIS.    no stones    no ulcers     no tumors    no urethral polyps    no trabeculation  - Right UO normal. Left UO was surgically absent.  Post-Procedure: - Patient tolerated the procedure well  Assessment/ Plan:  1. Malignant neoplasm of urinary bladder, unspecified site ([place]) No evidence of disease today on cystoscopy We will continue to follow areas which appear to be most consistent with cystitis cystica closely Urine cytology today In light of BCG shortage, will hold off on extensive maintenance BCG until national back order has resolved She is agreeable to this plan. - Urinalysis, Complete - ciprofloxacin (CIPRO) tablet 500 mg; Take 1 tablet (500 mg total) by mouth once. - lidocaine (XYLOCAINE) 2 % jelly 1 application; Place 1 application into the urethra once.   Return in about 3 months (around 03/18/2018) for cysto.   Hollice Espy, MD

## 2017-12-17 ENCOUNTER — Ambulatory Visit: Payer: Medicare Other | Admitting: Internal Medicine

## 2017-12-22 ENCOUNTER — Other Ambulatory Visit: Payer: Self-pay | Admitting: Urology

## 2018-01-01 ENCOUNTER — Other Ambulatory Visit: Payer: Self-pay | Admitting: Internal Medicine

## 2018-01-03 ENCOUNTER — Other Ambulatory Visit: Payer: Self-pay | Admitting: Internal Medicine

## 2018-01-06 ENCOUNTER — Ambulatory Visit (INDEPENDENT_AMBULATORY_CARE_PROVIDER_SITE_OTHER): Payer: Medicare Other | Admitting: Vascular Surgery

## 2018-01-06 ENCOUNTER — Ambulatory Visit (INDEPENDENT_AMBULATORY_CARE_PROVIDER_SITE_OTHER): Payer: Medicare Other

## 2018-01-06 ENCOUNTER — Encounter

## 2018-01-06 ENCOUNTER — Encounter (INDEPENDENT_AMBULATORY_CARE_PROVIDER_SITE_OTHER): Payer: Self-pay | Admitting: Vascular Surgery

## 2018-01-06 VITALS — BP 122/64 | HR 80 | Resp 15 | Ht 67.0 in | Wt 161.0 lb

## 2018-01-06 DIAGNOSIS — E1151 Type 2 diabetes mellitus with diabetic peripheral angiopathy without gangrene: Secondary | ICD-10-CM

## 2018-01-06 DIAGNOSIS — I70213 Atherosclerosis of native arteries of extremities with intermittent claudication, bilateral legs: Secondary | ICD-10-CM

## 2018-01-06 DIAGNOSIS — Z87891 Personal history of nicotine dependence: Secondary | ICD-10-CM

## 2018-01-06 DIAGNOSIS — I6523 Occlusion and stenosis of bilateral carotid arteries: Secondary | ICD-10-CM

## 2018-01-06 DIAGNOSIS — E785 Hyperlipidemia, unspecified: Secondary | ICD-10-CM

## 2018-01-06 DIAGNOSIS — E1169 Type 2 diabetes mellitus with other specified complication: Secondary | ICD-10-CM | POA: Diagnosis not present

## 2018-01-06 DIAGNOSIS — I739 Peripheral vascular disease, unspecified: Secondary | ICD-10-CM

## 2018-01-06 NOTE — Progress Notes (Signed)
Subjective:    Patient ID: Laura Reed, female    DOB: 08-27-1960, 57 y.o.   MRN: [id number] Chief Complaint  Patient presents with  . Follow-up    6 month f/u ABI, Aortic Ill, LE arterial and Carotid   The patient presents for a 93-month peripheral artery disease / carotid artery stenosis follow-up.  The patient has an extensive bilateral lower extremity peripheral artery disease history requiring multiple endovascular / open procedures and attempts to revascularize her legs.  The patient presents today with intermittent bilateral calf claudication.  The patient also has what she describes as "superficial ulcerations" to the bottom of her bilateral feet.  The patient is currently being treated by podiatry for these ulcerations.  She notes that they are slow to heal.  The patient underwent a bilateral lower extremity arterial duplex which was notable for a greater than 50% stenosis of the left distal external iliac artery.  Patent left to right femorofemoral bypass with monophasic blood flow distally right proximal profunda femoral to TP trunk with monophasic blood flow distally left distal external iliac to profunda femoral and above-knee popliteal with monophasic blood flow distally abdominal aorta with monophasic blood flow distally with PSV ranging from 80-87.  The patient denies any rest pain symptoms.  The patient also underwent a bilateral carotid artery duplex which was notable for no evidence of stenosis in the right ICA the right ECA.  Is greater than 50% stenosed left carotid velocities consistent with a 60 to 79% stenosis the left ECA.  Is greater than 50% stenosed.  When compared to the previous exam on October 04, 2016 there is been no significant change. The patient denies experiencing Amaurosis Fugax, TIA like symptoms or focal motor deficits.  The patient denies any fever, nausea vomiting.  Review of Systems  Constitutional: Negative.   HENT: Negative.   Eyes: Negative.     Respiratory: Negative.   Cardiovascular:       PAD Carotid stenosis  Gastrointestinal: Negative.   Endocrine: Negative.   Genitourinary: Negative.   Musculoskeletal: Negative.   Skin: Negative.   Allergic/Immunologic: Negative.   Neurological: Negative.   Hematological: Negative.   Psychiatric/Behavioral: Negative.       Objective:   Physical Exam  Constitutional: She is oriented to person, place, and time. She appears well-developed and well-nourished. No distress.  HENT:  Head: Normocephalic and atraumatic.  Right Ear: External ear normal.  Left Ear: External ear normal.  Eyes: Pupils are equal, round, and reactive to light. Conjunctivae and EOM are normal.  Neck: Normal range of motion.  Left carotid bruit noted  Cardiovascular: Normal rate, regular rhythm, normal heart sounds and intact distal pulses.  Pulses:      Radial pulses are 2+ on the right side, and 2+ on the left side.  Hard to palpate pedal pulses however the bilateral feet are semi-warm  Pulmonary/Chest: Effort normal and breath sounds normal.  Musculoskeletal: Normal range of motion. She exhibits no edema.  Neurological: She is alert and oriented to person, place, and time.  Skin: She is not diaphoretic.  Small superficial ulcerations noted to the bottom of the bilateral feet.  There is no cellulitis.  There is no drainage.  There is no necrotic tissue.  Psychiatric: She has a normal mood and affect. Her behavior is normal. Judgment and thought content normal.  Vitals reviewed.  BP 122/64 (BP Location: Right Arm, Patient Position: Sitting)   Pulse 80   Resp 15   Ht  5\' 7"  (1.702 m)   Wt 161 lb (73 kg)   BMI 25.22 kg/m   Past Medical History:  Diagnosis Date  . Anxiety   . Bladder cancer ([place])   . CHF (congestive heart failure) ([place])   . Coronary artery disease   . Diabetes mellitus   . Heart murmur   . Hemorrhoid   . Hypertension   . Neuropathy   . PVD (peripheral vascular disease) ([place])   .  Urothelial carcinoma of kidney ([place]) 10/31/2014   INVASIVE UROTHELIAL CARCINOMA, LOW GRADE. T1, Nx.   Social History   Socioeconomic History  . Marital status: Married    Spouse name: Not on file  . Number of children: Not on file  . Years of education: Not on file  . Highest education level: Not on file  Occupational History  . Not on file  Social Needs  . Financial resource strain: Not on file  . Food insecurity:    Worry: Not on file    Inability: Not on file  . Transportation needs:    Medical: Not on file    Non-medical: Not on file  Tobacco Use  . Smoking status: Former Smoker    Packs/day: 2.00    Years: 35.00    Pack years: 70.00    Types: Cigarettes    Last attempt to quit: 03/30/2011    Years since quitting: 6.7  . Smokeless tobacco: Never Used  Substance and Sexual Activity  . Alcohol use: No    Alcohol/week: 0.0 oz  . Drug use: No  . Sexual activity: Not Currently  Lifestyle  . Physical activity:    Days per week: Not on file    Minutes per session: Not on file  . Stress: Not on file  Relationships  . Social connections:    Talks on phone: Not on file    Gets together: Not on file    Attends religious service: Not on file    Active member of club or organization: Not on file    Attends meetings of clubs or organizations: Not on file    Relationship status: Not on file  . Intimate partner violence:    Fear of current or ex partner: Not on file    Emotionally abused: Not on file    Physically abused: Not on file    Forced sexual activity: Not on file  Other Topics Concern  . Not on file  Social History Narrative  . Not on file   Past Surgical History:  Procedure Laterality Date  . AMPUTATION TOE    . ARTERIAL BYPASS SURGRY  2009, 2013 x 2   right leg , done in [place]  . CARDIAC CATHETERIZATION    . CAROTID ENDARTERECTOMY Right 01/2014   Dr Delana Meyer  . CATARACT EXTRACTION W/PHACO Right 12/14/2014   Procedure: CATARACT EXTRACTION PHACO AND  INTRAOCULAR LENS PLACEMENT (IOC);  Surgeon: Lyla Glassing, MD;  Location: [facility];  Service: Ophthalmology;  Laterality: Right;  [place]   00:38.6              AP        7.1                   CDE  2.76  . CESAREAN SECTION    . CHOLECYSTECTOMY  03-03-12   Porcelain gallbladder, gallstones,  [place]  . COLONOSCOPY W/ BIOPSIES  04/28/2012   Hyperplastic rectal polyps.  . CORONARY ARTERY BYPASS GRAFT  2009   3  vessel  . CYSTOSCOPY W/ RETROGRADES Right 09/01/2016   Procedure: CYSTOSCOPY WITH RETROGRADE PYELOGRAM;  Surgeon: Hollice Espy, MD;  Location: [facility];  Service: Urology;  Laterality: Right;  . HERNIA REPAIR  10-31-14   ventral, retro-rectus atrium mesh  . LOWER EXTREMITY ANGIOGRAPHY Left 12/10/2016   Procedure: Lower Extremity Angiography;  Surgeon: Katha Cabal, MD;  Location: [place] [facility];  Service: Cardiovascular;  Laterality: Left;  . NEPHRECTOMY Left 10-31-14  . PERIPHERAL VASCULAR CATHETERIZATION Left 05/01/2015   Procedure: Lower Extremity Angiography;  Surgeon: Katha Cabal, MD;  Location: [place] [facility];  Service: Cardiovascular;  Laterality: Left;  . PERIPHERAL VASCULAR CATHETERIZATION  05/01/2015   Procedure: Lower Extremity Intervention;  Surgeon: Katha Cabal, MD;  Location: [place] [facility];  Service: Cardiovascular;;  . PERIPHERAL VASCULAR CATHETERIZATION Left 02/20/2015   Procedure: Pelvic Angiography;  Surgeon: Katha Cabal, MD;  Location: [place] [facility];  Service: Cardiovascular;  Laterality: Left;  . TRANSURETHRAL RESECTION OF BLADDER TUMOR WITH MITOMYCIN-C N/A 09/01/2016   Procedure: TRANSURETHRAL RESECTION OF BLADDER TUMOR WITH MITOMYCIN-C;  Surgeon: Hollice Espy, MD;  Location: [facility];  Service: Urology;  Laterality: N/A;   Family History  Problem Relation Age of Onset  . Cancer Mother 72       Lung Cancer  . Cancer Father 36       Lung Ca  . Diabetes Son   . Breast cancer Maternal Grandmother   . Kidney cancer Neg Hx    . Bladder Cancer Neg Hx   . Prostate cancer Neg Hx    No Known Allergies     Assessment & Plan:  The patient presents for a 74-month peripheral artery disease / carotid artery stenosis follow-up.  The patient has an extensive bilateral lower extremity peripheral artery disease history requiring multiple endovascular / open procedures and attempts to revascularize her legs.  The patient presents today with intermittent bilateral calf claudication.  The patient also has what she describes as "superficial ulcerations" to the bottom of her bilateral feet.  The patient is currently being treated by podiatry for these ulcerations.  She notes that they are slow to heal.  The patient underwent a bilateral lower extremity arterial duplex which was notable for a greater than 50% stenosis of the left distal external iliac artery.  Patent left to right femorofemoral bypass with monophasic blood flow distally right proximal profunda femoral to TP trunk with monophasic blood flow distally left distal external iliac to profunda femoral and above-knee popliteal with monophasic blood flow distally abdominal aorta with monophasic blood flow distally with PSV ranging from 80-87.  The patient denies any rest pain symptoms.  The patient also underwent a bilateral carotid artery duplex which was notable for no evidence of stenosis in the right ICA the right ECA.  Is greater than 50% stenosed left carotid velocities consistent with a 60 to 79% stenosis the left ECA.  Is greater than 50% stenosed.  When compared to the previous exam on October 04, 2016 there is been no significant change. The patient denies experiencing Amaurosis Fugax, TIA like symptoms or focal motor deficits.  The patient denies any fever, nausea vomiting.  1. PVD (peripheral vascular disease) ([place]) - Stable Patient with relatively stable peripheral artery disease with an extensive history of endovascular/open procedures and attempt to revascularize the  legs. The patient presents with increasing claudication to the bilateral calves and an ulcer formation to the bottom of the feet which are slow  healing There is monophasic blood flow through all patient's bypasses Recommend starting with a left lower extremity angiogram and attempt to assess the patient's anatomy and exact degree of stenosis. If appropriate an attempt to revascularize the extremity can take place. The patient understands that she may have to undergo a right lower extremity angiogram in the future based on left leg findings Procedure, risk and benefits explained to the patient All questions answered The patient wishes to proceed  2. Controlled type 2 DM with peripheral circulatory disorder ([facility]) - Stable Encouraged good control as its slows the progression of atherosclerotic disease  3. Hyperlipidemia associated with type 2 diabetes mellitus ([place]) - Stable Encouraged good control as its slows the progression of atherosclerotic disease  4. History of tobacco abuse - Stable We had a discussion for approximately five minutes regarding the absolute need for smoking cessation due to the deleterious nature of tobacco on the vascular system. We discussed the tobacco use would diminish patency of any intervention, and likely significantly worsen progressio of disease. We discussed multiple agents for quitting including replacement therapy or medications to reduce cravings such as Chantix. The patient voices their understanding of the importance of smoking cessation.  5. Bilateral carotid artery stenosis - Stabe Studies reviewed with patient. Patient asymptomatic with stable duplex.  No intervention at this time.  Patient to return in six months for surveillance carotid duplex. Patient to continue medical optimization with ASA and dyslipidemia medication. I have discussed with the patient at length the risk factors for and pathogenesis of atherosclerotic disease and encouraged a healthy  diet, regular exercise regimen and blood pressure / glucose control.  Patient was instructed to contact our office in the interim with problems such as arm / leg weakness or numbness, speech / swallowing difficulty or temporary monocular blindness. The patient expresses their understanding.   - VAS US CAROTID; Future  Current Outpatient Medications on File Prior to Visit  Medication Sig Dispense Refill  . ACCU-CHEK AVIVA PLUS test strip TEST BLOOD SUGAR 2 TO 3 TIMES DAILY 100 each 0  . ACCU-CHEK AVIVA PLUS test strip TEST BLOOD SUGAR 2 TO 3 TIMES DAILY 300 each 1  . acetaminophen (TYLENOL) 500 MG tablet Take 1,000 mg by mouth every 6 (six) hours as needed for mild pain or headache.     . ALPRAZolam (XANAX) 0.5 MG tablet Take 1 tablet (0.5 mg total) by mouth daily as needed. for anxiety 30 tablet 5  . AMITIZA 24 MCG capsule TAKE ONE CAPSULE BY MOUTH TWICE DAILY WITH A MEAL 60 capsule 0  . amLODipine (NORVASC) 5 MG tablet TAKE 1 TABLET(5 MG) BY MOUTH DAILY 90 tablet 1  . aspirin 81 MG chewable tablet Chew 81 mg by mouth daily.    Marland Kitchen atorvastatin (LIPITOR) 20 MG tablet TAKE 1 TABLET BY MOUTH EVERY DAY 90 tablet 0  . B-D ULTRAFINE III SHORT PEN 31G X 8 MM MISC TEST BLOOD SUGAR TWICE DAILY 100 each 0  . B-D ULTRAFINE III SHORT PEN 31G X 8 MM MISC TEST BLOOD SUGAR TWICE DAILY 100 each 2  . Cholecalciferol (VITAMIN D3) 2000 UNITS TABS Take 2,000 Units by mouth daily after supper.     . clopidogrel (PLAVIX) 75 MG tablet TAKE 1 TABLET BY MOUTH EVERY DAY 90 tablet 1  . DIGOX 125 MCG tablet TAKE 1 TABLET BY MOUTH EVERY DAY 90 tablet 1  . diphenhydrAMINE (BENADRYL) 25 mg capsule Take 25 mg by mouth at bedtime as needed for sleep.    Marland Kitchen  doxycycline (VIBRA-TABS) 100 MG tablet   1  . gabapentin (NEURONTIN) 600 MG tablet TAKE 1 TABLET BY MOUTH THREE TIMES DAILY 270 tablet 1  . HUMALOG MIX 75/25 KWIKPEN (75-25) 100 UNIT/ML Kwikpen INJECT 30 UNITS UNDER THE SKIN EVERY MORNING AND 36 UNITS EVERY EVENING 30 mL 3   . HUMALOG MIX 75/25 KWIKPEN (75-25) 100 UNIT/ML Kwikpen INJECT 30 UNITS UNDER THE SKIN EVERY MORNING AND 36 UNITS EVERY EVENING 30 mL 0  . Lactulose 20 GM/30ML SOLN 30 ml every 4 hours until constipation is relieved (Patient taking differently: Take 30 mLs by mouth See admin instructions. 30 ml every 4 hours until constipation is relieved) 236 mL 3  . Lancets Misc. MISC Use to check blood sugars 3 times daily 100 each 11  . LEVEMIR FLEXTOUCH 100 UNIT/ML Pen INJECT 40 UNITS UNDER THE SKIN EVERY DAY AT 10 PM 15 mL 2  . LORazepam (ATIVAN) 0.5 MG tablet TAKE 1 TABLET BY MOUTH AT BEDTIME. MAY REPEAT IN 1 HOUR IF NEEDED 60 tablet 0  . mometasone (ELOCON) 0.1 % lotion Apply 1-2 application topically daily as needed (into ear canal for ear irriation).     . neomycin-polymyxin b-dexamethasone (MAXITROL) 3.5-10000-0.1 SUSP SHAKE LQ AND INT 1 GTT IN OD TID FOR 7 DAYS  0  . TRUEPLUS LANCETS 28G MISC CHECK BLOOD SUGAR FOUR TIMES DAILY 200 each 0  . TRUEPLUS LANCETS 28G MISC CHECK BLOOD SUGAR FOUR TIMES DAILY 200 each 0   Current Facility-Administered Medications on File Prior to Visit  Medication Dose Route Frequency Provider Last Rate Last Dose  . lidocaine-EPINEPHrine (XYLOCAINE W/EPI) 2 %-1:100000 (with pres) injection 1.7 mL  1.7 mL Intradermal Once [name], MD       There are no Patient Instructions on file for this visit. No follow-ups on file.  Terris Bodin A Velvet [place], PA-C

## 2018-01-08 ENCOUNTER — Encounter (INDEPENDENT_AMBULATORY_CARE_PROVIDER_SITE_OTHER): Payer: Self-pay

## 2018-01-10 ENCOUNTER — Other Ambulatory Visit: Payer: Self-pay | Admitting: Internal Medicine

## 2018-01-11 DIAGNOSIS — Z794 Long term (current) use of insulin: Secondary | ICD-10-CM | POA: Diagnosis not present

## 2018-01-11 DIAGNOSIS — I739 Peripheral vascular disease, unspecified: Secondary | ICD-10-CM | POA: Diagnosis not present

## 2018-01-11 DIAGNOSIS — E1140 Type 2 diabetes mellitus with diabetic neuropathy, unspecified: Secondary | ICD-10-CM | POA: Diagnosis not present

## 2018-01-11 DIAGNOSIS — L97511 Non-pressure chronic ulcer of other part of right foot limited to breakdown of skin: Secondary | ICD-10-CM | POA: Diagnosis not present

## 2018-01-11 DIAGNOSIS — L97521 Non-pressure chronic ulcer of other part of left foot limited to breakdown of skin: Secondary | ICD-10-CM | POA: Diagnosis not present

## 2018-01-12 ENCOUNTER — Other Ambulatory Visit (INDEPENDENT_AMBULATORY_CARE_PROVIDER_SITE_OTHER): Payer: Self-pay | Admitting: Vascular Surgery

## 2018-01-18 ENCOUNTER — Encounter
Admission: RE | Admit: 2018-01-18 | Discharge: 2018-01-18 | Disposition: A | Discharge status: Home / Self Care | Payer: Medicare Other | Source: Ambulatory Visit | Attending: Vascular Surgery | Admitting: Vascular Surgery

## 2018-01-18 ENCOUNTER — Other Ambulatory Visit: Payer: Self-pay

## 2018-01-18 DIAGNOSIS — Z01812 Encounter for preprocedural laboratory examination: Secondary | ICD-10-CM | POA: Insufficient documentation

## 2018-01-18 DIAGNOSIS — I70209 Unspecified atherosclerosis of native arteries of extremities, unspecified extremity: Secondary | ICD-10-CM | POA: Diagnosis not present

## 2018-01-18 LAB — BUN: BUN: 28 mg/dL — ABNORMAL HIGH (ref 6–20)

## 2018-01-18 LAB — CREATININE, SERUM
CREATININE: 1.16 mg/dL — AB (ref 0.44–1.00)
GFR calc Af Amer: 59 mL/min — ABNORMAL LOW (ref >60)
GFR, EST NON AFRICAN AMERICAN: 50 mL/min — AB (ref >60)

## 2018-01-18 MED ORDER — CEFAZOLIN SODIUM-DEXTROSE 2-4 GM/100ML-% IV SOLN
2.0000 g | Freq: Once | INTRAVENOUS | Status: AC
  Administered 2018-02-02: 2 g via INTRAVENOUS

## 2018-01-18 NOTE — Patient Instructions (Signed)
FOLLOW INSTRUCTIONS GIVEN TO YOU BY [facility].  Your procedure is scheduled with Dr. Delana Meyer                       On:  Tuesday, January 19, 2018    Go to '[facility]' on the first floor of the [facility].  Do not eat or drink 8 hours prior to your procedure.    Please call Dr Nino Parsley office with any questions or concerns: [phone].  You will need to have someone drive you home and stay with you the night of the procedure.

## 2018-01-21 ENCOUNTER — Other Ambulatory Visit: Payer: Self-pay | Admitting: Internal Medicine

## 2018-02-02 ENCOUNTER — Ambulatory Visit
Admission: RE | Admit: 2018-02-02 | Discharge: 2018-02-02 | Disposition: A | Discharge status: Home / Self Care | Payer: Medicare Other | Source: Ambulatory Visit | Attending: Vascular Surgery | Admitting: Vascular Surgery

## 2018-02-02 ENCOUNTER — Encounter
Admission: RE | Disposition: A | Discharge status: Home / Self Care | Payer: Self-pay | Source: Ambulatory Visit | Attending: Vascular Surgery

## 2018-02-02 DIAGNOSIS — Z87891 Personal history of nicotine dependence: Secondary | ICD-10-CM | POA: Insufficient documentation

## 2018-02-02 DIAGNOSIS — E785 Hyperlipidemia, unspecified: Secondary | ICD-10-CM | POA: Diagnosis not present

## 2018-02-02 DIAGNOSIS — Z79899 Other long term (current) drug therapy: Secondary | ICD-10-CM | POA: Insufficient documentation

## 2018-02-02 DIAGNOSIS — Z951 Presence of aortocoronary bypass graft: Secondary | ICD-10-CM | POA: Diagnosis not present

## 2018-02-02 DIAGNOSIS — Z803 Family history of malignant neoplasm of breast: Secondary | ICD-10-CM | POA: Insufficient documentation

## 2018-02-02 DIAGNOSIS — I6523 Occlusion and stenosis of bilateral carotid arteries: Secondary | ICD-10-CM | POA: Insufficient documentation

## 2018-02-02 DIAGNOSIS — Z9049 Acquired absence of other specified parts of digestive tract: Secondary | ICD-10-CM | POA: Insufficient documentation

## 2018-02-02 DIAGNOSIS — L97521 Non-pressure chronic ulcer of other part of left foot limited to breakdown of skin: Secondary | ICD-10-CM | POA: Diagnosis not present

## 2018-02-02 DIAGNOSIS — Z7901 Long term (current) use of anticoagulants: Secondary | ICD-10-CM | POA: Diagnosis not present

## 2018-02-02 DIAGNOSIS — E11621 Type 2 diabetes mellitus with foot ulcer: Secondary | ICD-10-CM | POA: Insufficient documentation

## 2018-02-02 DIAGNOSIS — Z85528 Personal history of other malignant neoplasm of kidney: Secondary | ICD-10-CM | POA: Diagnosis not present

## 2018-02-02 DIAGNOSIS — E1169 Type 2 diabetes mellitus with other specified complication: Secondary | ICD-10-CM | POA: Insufficient documentation

## 2018-02-02 DIAGNOSIS — Z794 Long term (current) use of insulin: Secondary | ICD-10-CM | POA: Insufficient documentation

## 2018-02-02 DIAGNOSIS — Z8551 Personal history of malignant neoplasm of bladder: Secondary | ICD-10-CM | POA: Diagnosis not present

## 2018-02-02 DIAGNOSIS — Z902 Acquired absence of lung [part of]: Secondary | ICD-10-CM | POA: Insufficient documentation

## 2018-02-02 DIAGNOSIS — Z9841 Cataract extraction status, right eye: Secondary | ICD-10-CM | POA: Diagnosis not present

## 2018-02-02 DIAGNOSIS — I70299 Other atherosclerosis of native arteries of extremities, unspecified extremity: Secondary | ICD-10-CM

## 2018-02-02 DIAGNOSIS — Z955 Presence of coronary angioplasty implant and graft: Secondary | ICD-10-CM | POA: Diagnosis not present

## 2018-02-02 DIAGNOSIS — E1140 Type 2 diabetes mellitus with diabetic neuropathy, unspecified: Secondary | ICD-10-CM | POA: Diagnosis not present

## 2018-02-02 DIAGNOSIS — I7025 Atherosclerosis of native arteries of other extremities with ulceration: Secondary | ICD-10-CM | POA: Insufficient documentation

## 2018-02-02 DIAGNOSIS — Z833 Family history of diabetes mellitus: Secondary | ICD-10-CM | POA: Insufficient documentation

## 2018-02-02 DIAGNOSIS — Z89429 Acquired absence of other toe(s), unspecified side: Secondary | ICD-10-CM | POA: Insufficient documentation

## 2018-02-02 DIAGNOSIS — Z7982 Long term (current) use of aspirin: Secondary | ICD-10-CM | POA: Insufficient documentation

## 2018-02-02 DIAGNOSIS — I110 Hypertensive heart disease with heart failure: Secondary | ICD-10-CM | POA: Diagnosis not present

## 2018-02-02 DIAGNOSIS — I509 Heart failure, unspecified: Secondary | ICD-10-CM | POA: Insufficient documentation

## 2018-02-02 DIAGNOSIS — L97909 Non-pressure chronic ulcer of unspecified part of unspecified lower leg with unspecified severity: Secondary | ICD-10-CM

## 2018-02-02 DIAGNOSIS — I70248 Atherosclerosis of native arteries of left leg with ulceration of other part of lower left leg: Secondary | ICD-10-CM | POA: Diagnosis not present

## 2018-02-02 DIAGNOSIS — Z801 Family history of malignant neoplasm of trachea, bronchus and lung: Secondary | ICD-10-CM | POA: Insufficient documentation

## 2018-02-02 DIAGNOSIS — L97511 Non-pressure chronic ulcer of other part of right foot limited to breakdown of skin: Secondary | ICD-10-CM | POA: Insufficient documentation

## 2018-02-02 DIAGNOSIS — E1151 Type 2 diabetes mellitus with diabetic peripheral angiopathy without gangrene: Secondary | ICD-10-CM | POA: Insufficient documentation

## 2018-02-02 DIAGNOSIS — Z9889 Other specified postprocedural states: Secondary | ICD-10-CM | POA: Insufficient documentation

## 2018-02-02 HISTORY — PX: LOWER EXTREMITY ANGIOGRAPHY: CATH118251

## 2018-02-02 LAB — GLUCOSE, CAPILLARY
Glucose-Capillary: 186 mg/dL — ABNORMAL HIGH (ref 70–99)
Glucose-Capillary: 144 mg/dL — ABNORMAL HIGH (ref 70–99)

## 2018-02-02 SURGERY — LOWER EXTREMITY ANGIOGRAPHY
Anesthesia: Moderate Sedation | Laterality: Left

## 2018-02-02 MED ORDER — ONDANSETRON HCL 4 MG/2ML IJ SOLN: 4.0000 mg | Freq: Four times a day (QID) | INTRAMUSCULAR | Status: DC | PRN

## 2018-02-02 MED ORDER — SODIUM CHLORIDE 0.9% FLUSH: 3.0000 mL | Freq: Two times a day (BID) | INTRAVENOUS | Status: DC

## 2018-02-02 MED ORDER — OXYCODONE HCL 5 MG PO TABS: 5.0000 mg | ORAL_TABLET | ORAL | Status: DC | PRN

## 2018-02-02 MED ORDER — LABETALOL HCL 5 MG/ML IV SOLN: 10.0000 mg | INTRAVENOUS | Status: DC | PRN

## 2018-02-02 MED ORDER — MIDAZOLAM HCL 5 MG/5ML IJ SOLN
INTRAMUSCULAR | Status: AC
  Filled 2018-02-02: qty 5

## 2018-02-02 MED ORDER — METHYLPREDNISOLONE SODIUM SUCC 125 MG IJ SOLR: 125.0000 mg | INTRAMUSCULAR | Status: DC | PRN

## 2018-02-02 MED ORDER — SODIUM CHLORIDE 0.9 % IV SOLN: 250.0000 mL | INTRAVENOUS | Status: DC | PRN

## 2018-02-02 MED ORDER — FAMOTIDINE 20 MG PO TABS: 40.0000 mg | ORAL_TABLET | ORAL | Status: DC | PRN

## 2018-02-02 MED ORDER — ATORVASTATIN CALCIUM 10 MG PO TABS: 10.0000 mg | ORAL_TABLET | Freq: Every day | ORAL | Status: DC

## 2018-02-02 MED ORDER — CEFAZOLIN SODIUM-DEXTROSE 2-4 GM/100ML-% IV SOLN
INTRAVENOUS | Status: AC
  Administered 2018-02-02: 2 g via INTRAVENOUS
  Filled 2018-02-02: qty 100

## 2018-02-02 MED ORDER — FENTANYL CITRATE (PF) 100 MCG/2ML IJ SOLN
INTRAMUSCULAR | Status: AC
  Filled 2018-02-02: qty 2

## 2018-02-02 MED ORDER — SODIUM CHLORIDE 0.9 % IV SOLN
INTRAVENOUS | Status: DC
  Administered 2018-02-02: 1000 mL via INTRAVENOUS

## 2018-02-02 MED ORDER — HYDRALAZINE HCL 20 MG/ML IJ SOLN
5.0000 mg | INTRAMUSCULAR | Status: DC | PRN
Start: 2018-02-02 — End: 2018-02-02

## 2018-02-02 MED ORDER — SODIUM CHLORIDE 0.9% FLUSH: 3.0000 mL | INTRAVENOUS | Status: DC | PRN

## 2018-02-02 MED ORDER — HYDROMORPHONE HCL 1 MG/ML IJ SOLN
1.0000 mg | Freq: Once | INTRAMUSCULAR | Status: DC | PRN
Start: 2018-02-02 — End: 2018-02-02

## 2018-02-02 MED ORDER — MIDAZOLAM HCL 2 MG/2ML IJ SOLN
INTRAMUSCULAR | Status: DC | PRN
  Administered 2018-02-02: 2 mg via INTRAVENOUS
  Administered 2018-02-02: 1 mg via INTRAVENOUS

## 2018-02-02 MED ORDER — FENTANYL CITRATE (PF) 100 MCG/2ML IJ SOLN
INTRAMUSCULAR | Status: DC | PRN
  Administered 2018-02-02 (×2): 50 ug via INTRAVENOUS

## 2018-02-02 MED ORDER — MORPHINE SULFATE (PF) 4 MG/ML IV SOLN: 2.0000 mg | INTRAVENOUS | Status: DC | PRN

## 2018-02-02 MED ORDER — ACETAMINOPHEN 325 MG PO TABS: 650.0000 mg | ORAL_TABLET | ORAL | Status: DC | PRN

## 2018-02-02 MED ORDER — HYDROMORPHONE HCL 1 MG/ML IJ SOLN: 0.5000 mg | INTRAMUSCULAR | Status: DC | PRN

## 2018-02-02 MED ORDER — SODIUM CHLORIDE 0.9 % IV SOLN: INTRAVENOUS | Status: DC

## 2018-02-02 MED ORDER — LIDOCAINE HCL (PF) 1 % IJ SOLN
INTRAMUSCULAR | Status: AC
  Filled 2018-02-02: qty 30

## 2018-02-02 MED ORDER — HEPARIN (PORCINE) IN NACL 1000-0.9 UT/500ML-% IV SOLN
INTRAVENOUS | Status: AC
  Filled 2018-02-02: qty 1000

## 2018-02-02 MED ORDER — HEPARIN SODIUM (PORCINE) 1000 UNIT/ML IJ SOLN
INTRAMUSCULAR | Status: AC
Start: 2018-02-02 — End: ?
  Filled 2018-02-02: qty 1

## 2018-02-02 MED ORDER — HEPARIN SODIUM (PORCINE) 1000 UNIT/ML IJ SOLN
INTRAMUSCULAR | Status: DC | PRN
  Administered 2018-02-02: 4000 [IU] via INTRAVENOUS

## 2018-02-02 MED ORDER — IOPAMIDOL (ISOVUE-300) INJECTION 61%
INTRAVENOUS | Status: DC | PRN
  Administered 2018-02-02: 98 mL via INTRA_ARTERIAL

## 2018-02-02 SURGICAL SUPPLY — 24 items
BALLN DORADO 6X60X80 (BALLOONS) ×3
BALLN LUTONIX 7X80X130 (BALLOONS) ×3
BALLN LUTONIX AV 8X40X75 (BALLOONS) ×3
BALLN LUTONIX DCB 7X60X130 (BALLOONS) ×3
BALLOON DORADO 6X60X80 (BALLOONS) ×1 IMPLANT
BALLOON LUTONIX 7X80X130 (BALLOONS) ×1 IMPLANT
BALLOON LUTONIX AV 8X40X75 (BALLOONS) ×1 IMPLANT
BALLOON LUTONIX DCB 7X60X130 (BALLOONS) ×1 IMPLANT
CANNULA 5F STIFF (CANNULA) ×3 IMPLANT
CATH PIG 70CM (CATHETERS) ×3 IMPLANT
DEVICE CLOSURE MYNXGRIP 6/7F (Vascular Products) ×3 IMPLANT
DEVICE PRESTO INFLATION (MISCELLANEOUS) ×3 IMPLANT
GLIDECATH NONTAPER ANGL 5FR (CATHETERS) ×3 IMPLANT
GUIDEWIRE SUPER STIFF .035X180 (WIRE) ×3 IMPLANT
NEEDLE ENTRY 21GA 7CM ECHOTIP (NEEDLE) ×3 IMPLANT
PACK ANGIOGRAPHY (CUSTOM PROCEDURE TRAY) ×3 IMPLANT
SET INTRO CAPELLA COAXIAL (SET/KITS/TRAYS/PACK) ×3 IMPLANT
SHEATH BRITE TIP 5FRX11 (SHEATH) ×3 IMPLANT
SHEATH BRITE TIP 6FRX11 (SHEATH) ×3 IMPLANT
SHIELD X-DRAPE GOLD 12X17 (MISCELLANEOUS) ×3 IMPLANT
STENT LIFESTAR 8X80X80 (Permanent Stent) ×3 IMPLANT
TUBING CONTRAST HIGH PRESS 72 (TUBING) ×3 IMPLANT
WIRE J 3MM .035X145CM (WIRE) ×3 IMPLANT
WIRE MAGIC TORQUE 260C (WIRE) ×3 IMPLANT

## 2018-02-02 NOTE — H&P (Signed)
[facility] History & Physical Update  The patient was interviewed and re-examined.  The patient's previous History and Physical has been reviewed and is unchanged.  There is no change in the plan of care. We plan to proceed with the scheduled procedure.  Hortencia Pilar, MD  02/02/2018, 8:01 AM

## 2018-02-02 NOTE — Op Note (Signed)
[facility]  Percutaneous Study/Intervention Procedural Note   Date of Surgery: 02/02/2018,9:56 AM  Surgeon:[name], Dolores Lory   Pre-operative Diagnosis: Atherosclerotic occlusive disease bilateral lower extremities with ulceration of the left foot  Post-operative diagnosis:  Same  Procedure(s) Performed:  1.  Abdominal aortogram  2.  Bilateral lower extremity distal runoff  3.  Right lower extremity third order catheter placement  4.  Percutaneous transluminal angioplasty and stent placement left common iliac artery  5.  Percutaneous transluminal angioplasty and stent placement left external iliac artery  6.  Percutaneous transluminal angioplasty left common femoral  7.  Minx closure left femoral-popliteal bypass graft  Anesthesia: Conscious sedation was administered by the interventional radiology RN under my direct supervision. IV Versed plus fentanyl were utilized. Continuous ECG, pulse oximetry and blood pressure was monitored throughout the entire procedure. Conscious sedation was administered for a total of 141 minutes.  Sheath: 6 French Pinnacle sheath left femoral popliteal bypass graft retrograde  Contrast: 98 cc   Fluoroscopy Time: 7.7 minutes  Indications: The patient presented to the office for routine follow-up noting increased claudication symptoms and an ulceration of the left foot.  Physical exam as well as noninvasive studies suggested a significant deterioration in her overall arterial flow pattern.  This appeared to be centered within the iliac system on the left.  Given that the patient is currently maintained by left iliac inflow with a left femoral to right femoral bypass and then bilateral femoral popliteal prosthetic bypasses stenosis within the iliac system represented a profound inflow stenosis to both lower extremities.  Risks and benefits for angiography and possible intervention were reviewed all questions were answered patient agrees  to proceed.  Procedure:  ROXY MASTANDREA a 57 y.o. female who was identified and appropriate procedural time out was performed.  The patient was then placed supine on the table and prepped and draped in the usual sterile fashion.  Ultrasound was used to evaluate the left femoral popliteal bypass.  It was echolucent and pulsatile indicating it is patent .  An ultrasound image was acquired for the permanent record.  A micropuncture needle was used to access the left femoral popliteal bypass under direct ultrasound guidance.  The microwire was then advanced under fluoroscopic guidance without difficulty followed by the micro-sheath.  A 0.035 J wire was advanced without resistance and a 5Fr sheath was placed.  The wire was then negotiated into the aorta followed by the pigtail catheter which was placed at the T12 level.  AP projection of the aorta was obtained.  Single kidney is noted right renal artery is widely patent.  Infrarenal aorta is widely patent.  Aortic bifurcation demonstrates disease at the right common iliac but the origin and proximal half of the left common iliac is widely patent.  In the distal half of the left common iliac there is a eccentric plaque which appears to be 60 to 70% stenosis.  There also appears to be greater than 80% stenosis at the origin of the left external iliac.  Left internal iliac is occluded.  The previously placed stent within the external iliac on the left demonstrates a 60% stenosis in its proximal half.  Distally the stent appears patent within the common femoral there appears to be a 50 to 60% stenosis associated with the origin of the femorofemoral bypass.  4000 units of heparin was given and a Magic torque wire was then advanced through the iliac system position with its tip in the descending  thoracic aorta.  A 8 mm x 80 mm life star stent was then selected and deployed beginning at the midportion of the left common iliac and extending to the midportion of the  external iliac.  Initially a 7 mm x 60 mm Lutonix drug-eluting balloon was used to angioplasty the external iliac however at 10 atm this ruptured.  Therefore a 6 mm x 60 mm Dorado balloon was advanced across the external iliac angioplasty was to 18 atm for 1 minute.  The balloon was then deflated and repositioned across the common femoral and angioplasty to 12 atm for 1 minute was performed.  A 7 mm x 60 mm Lutonix balloon was then again advanced across the external iliac and the stent fully expanded to 12 atm for 1 minute.  Follow-up imaging demonstrated excellent result in the external iliac.  The common iliac was not fully expanded and therefore an 8 mm x 40 mm Lutonix drug-eluting balloon was advanced up into the common iliac and the inflation was to 12 atm for 1 minute.  The 8 mm Lutonix balloon was then deflated and repositioned across the left common femoral and inflation to 10 atm was performed for 1 minute.  Follow-up imaging was now performed.  Post intervention the left common iliac is now widely patent with less than 5% residual stenosis.  The external iliac on the left is now widely patent with approximately 10% residual stenosis in the proximal portion and 0% stenosis distally.  The left common femoral now demonstrates less than 10% residual stenosis.  Given these findings distal runoff was then obtained.  The sheath having access the left femoropopliteal bypass was then used and hand-injection of contrast was performed to complete distal runoff.  The wire was then reintroduced with a Kumpe catheter and the catheter and wire were negotiated first into the femorofemoral bypass and then across the femorofemoral down into the distal femoropopliteal bypass on the right where distal runoff was obtained.  After review of these images catheter and wire were removed oblique view of the sheath was obtained and a minx device deployed with no complications.  Findings:   Aortogram: The abdominal aorta is  opacified the bolus injection contrast.  As noted above there are no hemodynamically significant lesions.  There is a single renal artery to the right kidney.  This is widely patent.  The right common iliac is heavily diseased previously placed stent is noted it is patent however and filling a heavily diseased right internal iliac.  There is an 80% stenosis at the origin of the right internal iliac.  Right external iliac is occluded.  The left demonstrates patency of the left common iliac in its proximal 50% but the distal 50% has a high-grade stenosis of 60%.  There is high-grade stenosis at the origin of the external iliac with diffuse greater than 60% stenosis of the proximal half of the left external iliac stent.  Distally there is mild disease and there is a 50 to 60% stenosis in the left common femoral.  Femorofemoral is widely patent  As noted above following antroplasty and stent placement with postdilatation 8 mm in the common iliac and 7 mm in the external iliac there is now 10% residual stenosis or less with wide patency of the inflow system.  Right Lower Extremity: Femorofemoral to femoropopliteal anastomosis is widely patent.  Profunda femoris is patent but diseased.  The Pham to below-knee popliteal tibioperoneal trunk bypass is widely patent and there is two-vessel runoff to  the foot.  Left Lower Extremity: The femoral above-knee pop bypass is widely patent.  Distal anastomosis widely patent.  There is a 50 to 60% stenosis in the mid popliteal at the level of the tibial plateau.  There is two-vessel runoff to the foot via the anterior tibial which is widely patent and the peroneal which is patent.  This mid popliteal stenosis may need to be treated we will reassess her overall flow pattern with ultrasound status post this recent intervention and then decide whether intervention is appropriate.    Disposition: Patient was taken to the recovery room in stable condition having tolerated the  procedure well.  Belenda Cruise [name] 02/02/2018,9:56 AM

## 2018-02-14 ENCOUNTER — Other Ambulatory Visit: Payer: Self-pay | Admitting: Internal Medicine

## 2018-02-16 ENCOUNTER — Other Ambulatory Visit: Payer: Self-pay

## 2018-02-16 NOTE — Telephone Encounter (Signed)
Refilled: 11/27/2017 #30  Per pharmacy Last OV: 09/11/2017 Next OV: 03/01/2018

## 2018-02-17 MED ORDER — ALPRAZOLAM 0.5 MG PO TABS: 0.5000 mg | ORAL_TABLET | Freq: Every day | ORAL | 5 refills | Status: DC | PRN

## 2018-02-22 DIAGNOSIS — L97511 Non-pressure chronic ulcer of other part of right foot limited to breakdown of skin: Secondary | ICD-10-CM | POA: Diagnosis not present

## 2018-02-22 DIAGNOSIS — L97521 Non-pressure chronic ulcer of other part of left foot limited to breakdown of skin: Secondary | ICD-10-CM | POA: Diagnosis not present

## 2018-03-01 ENCOUNTER — Ambulatory Visit: Payer: Medicare Other | Admitting: Internal Medicine

## 2018-03-04 ENCOUNTER — Other Ambulatory Visit: Payer: Self-pay | Admitting: Internal Medicine

## 2018-03-22 ENCOUNTER — Other Ambulatory Visit: Payer: Self-pay | Admitting: Internal Medicine

## 2018-03-22 DIAGNOSIS — L97511 Non-pressure chronic ulcer of other part of right foot limited to breakdown of skin: Secondary | ICD-10-CM | POA: Diagnosis not present

## 2018-03-22 DIAGNOSIS — I739 Peripheral vascular disease, unspecified: Secondary | ICD-10-CM | POA: Diagnosis not present

## 2018-03-22 DIAGNOSIS — Z794 Long term (current) use of insulin: Secondary | ICD-10-CM | POA: Diagnosis not present

## 2018-03-22 DIAGNOSIS — E1140 Type 2 diabetes mellitus with diabetic neuropathy, unspecified: Secondary | ICD-10-CM | POA: Diagnosis not present

## 2018-03-22 DIAGNOSIS — L97521 Non-pressure chronic ulcer of other part of left foot limited to breakdown of skin: Secondary | ICD-10-CM | POA: Diagnosis not present

## 2018-03-23 ENCOUNTER — Ambulatory Visit (INDEPENDENT_AMBULATORY_CARE_PROVIDER_SITE_OTHER): Payer: Medicare Other | Admitting: Urology

## 2018-03-23 ENCOUNTER — Other Ambulatory Visit (INDEPENDENT_AMBULATORY_CARE_PROVIDER_SITE_OTHER): Payer: Self-pay | Admitting: Vascular Surgery

## 2018-03-23 ENCOUNTER — Encounter: Payer: Self-pay | Admitting: Urology

## 2018-03-23 DIAGNOSIS — I739 Peripheral vascular disease, unspecified: Secondary | ICD-10-CM

## 2018-03-23 DIAGNOSIS — Z8551 Personal history of malignant neoplasm of bladder: Secondary | ICD-10-CM

## 2018-03-25 ENCOUNTER — Ambulatory Visit (INDEPENDENT_AMBULATORY_CARE_PROVIDER_SITE_OTHER): Payer: Medicare Other

## 2018-03-25 ENCOUNTER — Encounter: Payer: Self-pay | Admitting: Internal Medicine

## 2018-03-25 ENCOUNTER — Ambulatory Visit (INDEPENDENT_AMBULATORY_CARE_PROVIDER_SITE_OTHER): Payer: Medicare Other | Admitting: Internal Medicine

## 2018-03-25 ENCOUNTER — Encounter (INDEPENDENT_AMBULATORY_CARE_PROVIDER_SITE_OTHER): Payer: Self-pay | Admitting: Vascular Surgery

## 2018-03-25 ENCOUNTER — Encounter

## 2018-03-25 ENCOUNTER — Ambulatory Visit (INDEPENDENT_AMBULATORY_CARE_PROVIDER_SITE_OTHER): Payer: Medicare Other | Admitting: Vascular Surgery

## 2018-03-25 VITALS — BP 87/54 | HR 63 | Resp 16 | Ht 67.0 in | Wt 158.8 lb

## 2018-03-25 VITALS — BP 146/60 | HR 71 | Temp 97.9°F | Resp 15 | Ht 67.0 in | Wt 159.6 lb

## 2018-03-25 DIAGNOSIS — E1151 Type 2 diabetes mellitus with diabetic peripheral angiopathy without gangrene: Secondary | ICD-10-CM

## 2018-03-25 DIAGNOSIS — I10 Essential (primary) hypertension: Secondary | ICD-10-CM

## 2018-03-25 DIAGNOSIS — Z23 Encounter for immunization: Secondary | ICD-10-CM

## 2018-03-25 DIAGNOSIS — I6523 Occlusion and stenosis of bilateral carotid arteries: Secondary | ICD-10-CM | POA: Diagnosis not present

## 2018-03-25 DIAGNOSIS — E1142 Type 2 diabetes mellitus with diabetic polyneuropathy: Secondary | ICD-10-CM | POA: Diagnosis not present

## 2018-03-25 DIAGNOSIS — I739 Peripheral vascular disease, unspecified: Secondary | ICD-10-CM | POA: Diagnosis not present

## 2018-03-25 DIAGNOSIS — I70213 Atherosclerosis of native arteries of extremities with intermittent claudication, bilateral legs: Secondary | ICD-10-CM | POA: Diagnosis not present

## 2018-03-25 LAB — LIPID PANEL
Cholesterol: 111 mg/dL (ref 0–200)
HDL: 36.9 mg/dL — AB (ref >39.00)
LDL Cholesterol: 51 mg/dL (ref 0–99)
NonHDL: 74
TRIGLYCERIDES: 113 mg/dL (ref 0.0–149.0)
Total CHOL/HDL Ratio: 3
VLDL: 22.6 mg/dL (ref 0.0–40.0)

## 2018-03-25 LAB — COMPREHENSIVE METABOLIC PANEL
ALBUMIN: 4.1 g/dL (ref 3.5–5.2)
ALK PHOS: 95 U/L (ref 39–117)
ALT: 13 U/L (ref 0–35)
AST: 12 U/L (ref 0–37)
BILIRUBIN TOTAL: 0.3 mg/dL (ref 0.2–1.2)
BUN: 26 mg/dL — ABNORMAL HIGH (ref 6–23)
CALCIUM: 9.6 mg/dL (ref 8.4–10.5)
CO2: 29 mEq/L (ref 19–32)
CREATININE: 1.07 mg/dL (ref 0.40–1.20)
Chloride: 101 mEq/L (ref 96–112)
GFR: 55.68 mL/min — AB (ref >60.00)
Glucose, Bld: 136 mg/dL — ABNORMAL HIGH (ref 70–99)
Potassium: 4.7 mEq/L (ref 3.5–5.1)
Sodium: 136 mEq/L (ref 135–145)
TOTAL PROTEIN: 7.1 g/dL (ref 6.0–8.3)

## 2018-03-25 LAB — HEMOGLOBIN A1C: Hgb A1c MFr Bld: 8.7 % — ABNORMAL HIGH (ref 4.6–6.5)

## 2018-03-25 MED ORDER — LISINOPRIL 5 MG PO TABS: 5.0000 mg | ORAL_TABLET | Freq: Every day | ORAL | 3 refills | Status: DC

## 2018-03-25 NOTE — Progress Notes (Signed)
Subjective:  Patient ID: Laura Reed, female    DOB: [birth year]  Age: 57 y.o. MRN: [id number]  CC: The primary encounter diagnosis was Controlled type 2 DM with peripheral circulatory disorder ([place]). Diagnoses of Need for influenza vaccination, Diabetic peripheral neuropathy associated with type 2 diabetes mellitus ([place]), and Essential hypertension were also pertinent to this visit.  HPI Laura Reed presents for 6 month follow up on diabetes.  Patient has no complaints today.  Patient is not been following a low glycemic index diet  Currently because she has been largely bed ridden due to blilateral foot ulcers.  She has been  binging on junk food due to reactive depression   She isd taking all prescribed medications regularly without side effects.  Fasting sugars have been around 200 most of the time and post prandials have been under 160 except on rare occasions. Patient is not exercising or  intentionally trying to lose weight .  Patient has had an eye exam in the last 12 months and checks feet regularly for signs of infection.  Patient does not walk barefoot outside,  And has had prior amputations  Due to severe PAD   Patient is up to date on all recommended vaccinations  Lab Results  Component Value Date   MICROALBUR 6.6 (H) 09/11/2017    bilateral diabetic foot ulcers  Present for 4 months.Marland Kitchen bc of location on plantar surface.  Treated initially with antibiotics.  No lnger infected but have beenslow to heal . Left foot is  Under  3rd MT  .   Right foot ulcer  Is on a hammer toe  not improving due to pressure   [employer].  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  She is wearing an offloading shoe on the right foot.   House is 700 sq feet. She states tha tit is too small for a motorized scooter   Other problems,:  Broke top teeth of her  dentures eating pistachios bc she was using her teeth to break the shells.    Outpatient Medications Prior to Visit  Medication Sig Dispense Refill  . ACCU-CHEK AVIVA PLUS test strip TEST BLOOD SUGAR 2 TO 3 TIMES DAILY 100 each 0  . ACCU-CHEK AVIVA PLUS test strip TEST BLOOD SUGAR 2 TO 3 TIMES DAILY 300 each 1  . acetaminophen (TYLENOL) 500 MG tablet Take 1,000 mg by mouth every 6 (six) hours as needed for mild pain or headache.     . ALPRAZolam (XANAX) 0.5 MG tablet Take 1 tablet (0.5 mg total) by mouth daily as needed. for anxiety 30 tablet 5  . amLODipine (NORVASC) 5 MG tablet TAKE 1 TABLET(5 MG) BY MOUTH DAILY 90 tablet 1  . aspirin 81 MG chewable tablet Chew 81 mg by mouth daily.    Marland Kitchen atorvastatin (LIPITOR) 20 MG tablet TAKE 1 TABLET BY MOUTH EVERY DAY 90 tablet 0  . B-D ULTRAFINE III SHORT PEN 31G X 8 MM MISC TEST BLOOD SUGAR TWICE DAILY 100 each 0  . B-D ULTRAFINE III SHORT PEN 31G X 8 MM MISC TEST BLOOD SUGAR TWICE DAILY 100 each 2  . Cholecalciferol (VITAMIN D3) 2000 UNITS TABS Take 2,000 Units by mouth daily after supper.     . clopidogrel (PLAVIX) 75 MG tablet TAKE 1 TABLET BY  MOUTH EVERY DAY 90 tablet 1  . DIGOX 125 MCG tablet TAKE 1 TABLET BY MOUTH EVERY DAY 90 tablet 1  . diphenhydrAMINE (BENADRYL) 25 mg capsule Take 25 mg by mouth at bedtime as needed for sleep.    Marland Kitchen gabapentin (NEURONTIN) 600 MG tablet TAKE 1 TABLET BY MOUTH THREE TIMES DAILY 270 tablet 1  . HUMALOG MIX 75/25 KWIKPEN (75-25) 100 UNIT/ML Kwikpen INJECT 30 UNITS UNDER THE SKIN EVERY MORNING AND 36 UNITS EVERY EVENING 30 mL 0  . Lancets Misc. MISC Use to check blood sugars 3 times daily 100 each 11  . LEVEMIR FLEXTOUCH 100 UNIT/ML Pen INJECT 40 UNITS UNDER THE SKIN EVERY DAY AT 10 PM 15 mL 2  . neomycin-bacitracin-polymyxin (NEOSPORIN) ointment Apply 1 application topically as needed for wound care.    . TRUEPLUS LANCETS 28G MISC CHECK BLOOD SUGAR FOUR TIMES DAILY 200 each 0  . TRUEPLUS LANCETS 28G MISC CHECK  BLOOD SUGAR FOUR TIMES DAILY 200 each 0  . Lactulose 20 GM/30ML SOLN 30 ml every 4 hours until constipation is relieved (Patient not taking: Reported on 01/14/2018) 236 mL 3  . AMITIZA 24 MCG capsule TAKE ONE CAPSULE BY MOUTH TWICE DAILY WITH A MEAL (Patient not taking: Reported on 03/25/2018) 60 capsule 0  . LORazepam (ATIVAN) 0.5 MG tablet TAKE 1 TABLET BY MOUTH AT BEDTIME. MAY REPEAT IN 1 HOUR IF NEEDED (Patient not taking: Reported on 01/14/2018) 60 tablet 0   No facility-administered medications prior to visit.     Review of Systems;  Patient denies headache, fevers, malaise, unintentional weight loss, skin rash, eye pain, sinus congestion and sinus pain, sore throat, dysphagia,  hemoptysis , cough, dyspnea, wheezing, chest pain, palpitations, orthopnea, edema, abdominal pain, nausea, melena, diarrhea, constipation, flank pain, dysuria, hematuria, urinary  Frequency, nocturia, numbness, tingling, seizures,  Focal weakness, Loss of consciousness,  Tremor, insomnia, depression, anxiety, and suicidal ideation.      Objective:  BP (!) 146/60 (BP Location: Left Arm, Patient Position: Sitting, Cuff Size: Normal)   Pulse 71   Temp 97.9 F (36.6 C) (Oral)   Resp 15   Ht 5\' 7"  (1.702 m)   Wt 159 lb 9.6 oz (72.4 kg)   SpO2 98%   BMI 25.00 kg/m   BP Readings from Last 3 Encounters:  03/25/18 (!) 87/54  03/25/18 (!) 146/60  02/02/18 (!) 157/62    Wt Readings from Last 3 Encounters:  03/25/18 158 lb 12.8 oz (72 kg)  03/25/18 159 lb 9.6 oz (72.4 kg)  02/02/18 163 lb (73.9 kg)    General appearance: alert, cooperative and appears stated age Ears: normal TM's and external ear canals both ears Throat: lips, mucosa, and tongue normal; teeth and gums normal Neck: no adenopathy, no carotid bruit, supple, symmetrical, trachea midline and thyroid not enlarged, symmetric, no tenderness/mass/nodules Back: symmetric, no curvature. ROM normal. No CVA tenderness. Lungs: clear to auscultation  bilaterally Heart: regular rate and rhythm, S1, S2 normal, no murmur, click, rub or gallop Abdomen: soft, non-tender; bowel sounds normal; no masses,  no organomegaly Pulses: 2+ and symmetric Skin: Skin color, texture, turgor normal. No rashes or lesions Lymph nodes: Cervical, supraclavicular, and axillary nodes normal.  Lab Results  Component Value Date   HGBA1C 8.7 (H) 03/25/2018   HGBA1C 6.8 (H) 09/11/2017   HGBA1C 7.6 (H) 06/05/2017    Lab Results  Component Value Date   CREATININE 1.07 03/25/2018   CREATININE 1.16 (H) 01/18/2018   CREATININE 0.89 09/11/2017  Lab Results  Component Value Date   WBC 10.5 02/18/2016   HGB 12.2 09/01/2016   HCT 36.0 09/01/2016   PLT 266.0 02/18/2016   GLUCOSE 136 (H) 03/25/2018   CHOL 111 03/25/2018   TRIG 113.0 03/25/2018   HDL 36.90 (L) 03/25/2018   LDLDIRECT 47.0 01/28/2017   LDLCALC 51 03/25/2018   ALT 13 03/25/2018   AST 12 03/25/2018   NA 136 03/25/2018   K 4.7 03/25/2018   CL 101 03/25/2018   CREATININE 1.07 03/25/2018   BUN 26 (H) 03/25/2018   CO2 29 03/25/2018   TSH 0.96 05/18/2015   INR 0.9 10/17/2014   HGBA1C 8.7 (H) 03/25/2018   MICROALBUR 6.6 (H) 09/11/2017    No results found.  Assessment & Plan:   Problem List Items Addressed This Visit    Controlled type 2 DM with peripheral circulatory disorder ([place]) - Primary    Loss of control due to dietary nonadherence..  Using mixed nsulin twice daily 30 units in the am,.  36 in the evening, and 40 units of Levemir daily.  She has been advised  to submit BS readings in 2 weeks so I can adjust doses   Lab Results  Component Value Date   HGBA1C 8.7 (H) 03/25/2018         Relevant Medications   lisinopril (PRINIVIL,ZESTRIL) 5 MG tablet   Other Relevant Orders   Hemoglobin A1c (Completed)   Comprehensive metabolic panel (Completed)   Lipid panel (Completed)   Diabetic peripheral neuropathy associated with type 2 diabetes mellitus ([place])    She has bilateral  superficial plantar ulcers that are ow chronic ,  Of 4 months duration.  She is not offloading them enough.  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  Th eother option is to get a motorized scooter, if  Medicaid will approve it.        Relevant Medications   lisinopril (PRINIVIL,ZESTRIL) 5 MG tablet   Hypertension    Elevated today .  Resuming lisinopril. Continue amlodipine       Relevant Medications   lisinopril (PRINIVIL,ZESTRIL) 5 MG tablet    Other Visit Diagnoses    Need for influenza vaccination       Relevant Orders   Flu Vaccine QUAD 6+ mos PF IM (Fluarix Quad PF) (Completed)      I have discontinued Hoyle Sauer E. Woolsey's LORazepam and AMITIZA. I am also having her start on lisinopril. Additionally, I am having her maintain her Vitamin D3, aspirin, Lactulose, B-D ULTRAFINE III SHORT PEN, TRUEPLUS LANCETS 28G, acetaminophen, diphenhydrAMINE, ACCU-CHEK AVIVA PLUS, TRUEPLUS LANCETS 28G, Lancets Misc., B-D ULTRAFINE III SHORT PEN, gabapentin, ACCU-CHEK AVIVA PLUS, atorvastatin, clopidogrel, neomycin-bacitracin-polymyxin, DIGOX, amLODipine, ALPRAZolam, LEVEMIR FLEXTOUCH, and HUMALOG MIX 75/25 KWIKPEN.  Meds ordered this encounter  Medications  . lisinopril (PRINIVIL,ZESTRIL) 5 MG tablet    Sig: Take 1 tablet (5 mg total) by mouth daily.    Dispense:  90 tablet    Refill:  3    Medications Discontinued During This Encounter  Medication Reason  . LORazepam (ATIVAN) 0.5 MG tablet Patient has not taken in last 30 days  . AMITIZA 24 MCG capsule     Follow-up: No follow-ups on file.   [name], MD

## 2018-03-25 NOTE — Progress Notes (Signed)
Patient was checked into clinic inadvertently but in fact did not arrive.  We called her and she was not aware of her follow-up today.  She was rescheduled.   Hollice Espy, MD

## 2018-03-25 NOTE — Patient Instructions (Addendum)
   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  Use htem for your pita chips  I am starting you back on lisinopril to lower your blood pressure more.  Continue the amlodipine as well

## 2018-03-25 NOTE — Progress Notes (Signed)
MRN : [id number]  Laura Reed is a 57 y.o. ([birth year]) female who presents with chief complaint of No chief complaint on file. Marland Kitchen  History of Present Illness:   The patient returns to the office for followup and review status post angiogram with intervention on 02/02/2018.   Procedure(s) Performed:             1.  Abdominal aortogram             2.  Bilateral lower extremity distal runoff             3.  Right lower extremity third order catheter placement             4.  Percutaneous transluminal angioplasty and stent placement left common iliac artery             5.  Percutaneous transluminal angioplasty and stent placement left external iliac artery             6.  Percutaneous transluminal angioplasty left common femoral             7.  Minx closure left femoral-popliteal bypass graft The patient returns to the office for followup and review of the noninvasive studies. There has been a significant deterioration in the lower extremity symptoms.  The patient notes interval shortening of their claudication distance and development of mild rest pain symptoms. No new ulcers or wounds have occurred since the last visit.  There have been no significant changes to the patient's overall health care.  The patient denies amaurosis fugax or recent TIA symptoms. There are no recent neurological changes noted. The patient denies history of DVT, PE or superficial thrombophlebitis. The patient denies recent episodes of angina or shortness of breath.   Duplex US of the bilateral lower extremity arterial system shows monophasic tibial signals bilaterally no improvement at the pedal level  No outpatient medications have been marked as taking for the 03/25/18 encounter (Appointment) with Delana Meyer, Dolores Lory, MD.    Past Medical History:  Diagnosis Date  . Anxiety   . Bladder cancer ([place])   . CHF (congestive heart failure) ([place])   . Coronary artery disease   . Diabetes mellitus   . Heart  murmur   . Hemorrhoid   . Hypertension   . Neuropathy   . PVD (peripheral vascular disease) ([place])   . Urothelial carcinoma of kidney ([place]) 10/31/2014   INVASIVE UROTHELIAL CARCINOMA, LOW GRADE. T1, Nx.    Past Surgical History:  Procedure Laterality Date  . AMPUTATION TOE    . ARTERIAL BYPASS SURGRY  2009, 2013 x 2   right leg , done in [place]  . CARDIAC CATHETERIZATION    . CAROTID ENDARTERECTOMY Right 01/2014   Dr Delana Meyer  . CATARACT EXTRACTION W/PHACO Right 12/14/2014   Procedure: CATARACT EXTRACTION PHACO AND INTRAOCULAR LENS PLACEMENT (IOC);  Surgeon: Lyla Glassing, MD;  Location: [facility];  Service: Ophthalmology;  Laterality: Right;  [place]   00:38.6              AP        7.1                   CDE  2.76  . CESAREAN SECTION    . CHOLECYSTECTOMY  03-03-12   Porcelain gallbladder, gallstones,  [facility]  . COLONOSCOPY W/ BIOPSIES  04/28/2012   Hyperplastic rectal polyps.  . CORONARY ARTERY BYPASS GRAFT  2009   3 vessel  .  CYSTOSCOPY W/ RETROGRADES Right 09/01/2016   Procedure: CYSTOSCOPY WITH RETROGRADE PYELOGRAM;  Surgeon: Hollice Espy, MD;  Location: [facility];  Service: Urology;  Laterality: Right;  . HERNIA REPAIR  10-31-14   ventral, retro-rectus atrium mesh  . LOWER EXTREMITY ANGIOGRAPHY Left 12/10/2016   Procedure: Lower Extremity Angiography;  Surgeon: Katha Cabal, MD;  Location: [place] [facility];  Service: Cardiovascular;  Laterality: Left;  . LOWER EXTREMITY ANGIOGRAPHY Left 02/02/2018   Procedure: LOWER EXTREMITY ANGIOGRAPHY;  Surgeon: Katha Cabal, MD;  Location: [place] [facility];  Service: Cardiovascular;  Laterality: Left;  . NEPHRECTOMY Left 10-31-14  . PERIPHERAL VASCULAR CATHETERIZATION Left 05/01/2015   Procedure: Lower Extremity Angiography;  Surgeon: Katha Cabal, MD;  Location: [place] [facility];  Service: Cardiovascular;  Laterality: Left;  . PERIPHERAL VASCULAR CATHETERIZATION  05/01/2015   Procedure: Lower Extremity Intervention;   Surgeon: Katha Cabal, MD;  Location: [place] [facility];  Service: Cardiovascular;;  . PERIPHERAL VASCULAR CATHETERIZATION Left 02/20/2015   Procedure: Pelvic Angiography;  Surgeon: Katha Cabal, MD;  Location: [place] [facility];  Service: Cardiovascular;  Laterality: Left;  . TRANSURETHRAL RESECTION OF BLADDER TUMOR WITH MITOMYCIN-C N/A 09/01/2016   Procedure: TRANSURETHRAL RESECTION OF BLADDER TUMOR WITH MITOMYCIN-C;  Surgeon: Hollice Espy, MD;  Location: [facility];  Service: Urology;  Laterality: N/A;    Social History Social History   Tobacco Use  . Smoking status: Former Smoker    Packs/day: 2.00    Years: 35.00    Pack years: 70.00    Types: Cigarettes    Last attempt to quit: 03/30/2011    Years since quitting: 6.9  . Smokeless tobacco: Never Used  Substance Use Topics  . Alcohol use: No    Alcohol/week: 0.0 standard drinks  . Drug use: No    Family History Family History  Problem Relation Age of Onset  . Cancer Mother 71       Lung Cancer  . Cancer Father 60       Lung Ca  . Diabetes Son   . Breast cancer Maternal Grandmother   . Kidney cancer Neg Hx   . Bladder Cancer Neg Hx   . Prostate cancer Neg Hx     No Known Allergies   REVIEW OF SYSTEMS (Negative unless checked)  Constitutional: [] Weight loss  [] Fever  [] Chills Cardiac: [] Chest pain   [] Chest pressure   [] Palpitations   [] Shortness of breath when laying flat   [] Shortness of breath with exertion. Vascular:  [x] Pain in legs with walking   [] Pain in legs at rest  [] History of DVT   [] Phlebitis   [x] Swelling in legs   [] Varicose veins   [x] Non-healing ulcers Pulmonary:   [] Uses home oxygen   [] Productive cough   [] Hemoptysis   [] Wheeze  [] COPD   [] Asthma Neurologic:  [] Dizziness   [] Seizures   [] History of stroke   [] History of TIA  [] Aphasia   [] Vissual changes   [] Weakness or numbness in arm   [] Weakness or numbness in leg Musculoskeletal:   [] Joint swelling   [] Joint pain   [] Low back  pain Hematologic:  [] Easy bruising  [] Easy bleeding   [] Hypercoagulable state   [] Anemic Gastrointestinal:  [] Diarrhea   [] Vomiting  [] Gastroesophageal reflux/heartburn   [] Difficulty swallowing. Genitourinary:  [] Chronic kidney disease   [] Difficult urination  [] Frequent urination   [] Blood in urine Skin:  [] Rashes   [x] Ulcers  Psychological:  [] History of anxiety   []  History of major depression.  Physical Examination  There were no vitals filed for this visit. There is no height or weight on file to calculate BMI. Gen: WD/WN, NAD Head: [place]/AT, No temporalis wasting.  Ear/Nose/Throat: Hearing grossly intact, nares w/o erythema or drainage Eyes: PER, EOMI, sclera nonicteric.  Neck: Supple, no large masses.   Pulmonary:  Good air movement, no audible wheezing bilaterally, no use of accessory muscles.  Cardiac: RRR, no JVD Vascular: ulcers noted bilateral feet uninfected but pale and non-healing Vessel Right Left  Radial Palpable Palpable  PT Not Palpable Not Palpable  DP Not Palpable Not Palpable  Gastrointestinal: Non-distended. No guarding/no peritoneal signs.  Musculoskeletal: M/S 5/5 throughout.  No deformity or atrophy.  Neurologic: CN 2-12 intact. Symmetrical.  Speech is fluent. Motor exam as listed above. Psychiatric: Judgment intact, Mood & affect appropriate for pt's clinical situation. Dermatologic: No rashes + ulcers noted.  No changes consistent with cellulitis. Lymph : No lichenification or skin changes of chronic lymphedema.  CBC Lab Results  Component Value Date   WBC 10.5 02/18/2016   HGB 12.2 09/01/2016   HCT 36.0 09/01/2016   MCV 87.0 02/18/2016   PLT 266.0 02/18/2016    BMET    Component Value Date/Time   NA 136 03/25/2018 0915   NA 135 11/02/2014 0609   K 4.7 03/25/2018 0915   K 4.2 11/02/2014 0609   CL 101 03/25/2018 0915   CL 107 11/02/2014 0609   CO2 29 03/25/2018 0915   CO2 23 11/02/2014 0609   GLUCOSE 136 (H) 03/25/2018 0915   GLUCOSE 108  (H) 11/02/2014 0609   BUN 26 (H) 03/25/2018 0915   BUN 20 11/02/2014 0609   CREATININE 1.07 03/25/2018 0915   CREATININE 1.01 11/09/2015 1549   CREATININE 1.01 11/09/2015 1549   CALCIUM 9.6 03/25/2018 0915   CALCIUM 7.3 (L) 11/02/2014 0609   GFRNONAA 50 (L) 01/18/2018 0836   GFRNONAA 50 (L) 11/02/2014 0609   GFRAA 59 (L) 01/18/2018 0836   GFRAA 58 (L) 11/02/2014 0609   Estimated Creatinine Clearance: 55.1 mL/min (by C-G formula based on SCr of 1.07 mg/dL).  COAG Lab Results  Component Value Date   INR 0.9 10/17/2014   INR 1.1 01/19/2014   INR 0.9 12/06/2013    Radiology No results found.   Assessment/Plan 1. Atherosclerosis of native artery of both lower extremities with intermittent claudication (HCC)  Recommend:  The patient has evidence of severe atherosclerotic changes of both lower extremities associated with ulceration and tissue loss of the foot.  This represents a limb threatening ischemia and places the patient at the risk for limb loss.  Patient should undergo angiography of the lower extremities with the hope for intervention for limb salvage.  Since she has had a complete diagnostic and has a very unique arterial reconstruction I will plan bilateral antegrade approaches.  This will allow for treatment of the below knee disease bilaterally.   The risks and benefits as well as the alternative therapies was discussed in detail with the patient.  All questions were answered.  Patient agrees to proceed with angiography.  The patient will follow up with me in the office after the procedure.    2. Bilateral carotid artery stenosis Recommend:  Given the patient's asymptomatic subcritical stenosis no further invasive testing or surgery at this time.  Continue antiplatelet therapy as prescribed Continue management of CAD, HTN and Hyperlipidemia Healthy heart diet,  encouraged exercise at least 4 times per week Follow up in 6 months with duplex ultrasound  and physical  exam   3. Essential hypertension Continue antihypertensive medications as already ordered, these medications have been reviewed and there are no changes at this time.   4. Diabetic peripheral neuropathy associated with type 2 diabetes mellitus ([place]) Continue hypoglycemic medications as already ordered, these medications have been reviewed and there are no changes at this time.  Hgb A1C to be monitored as already arranged by primary service     Hortencia Pilar, MD  03/25/2018 3:20 PM

## 2018-03-27 NOTE — Assessment & Plan Note (Signed)
She has bilateral superficial plantar ulcers that are ow chronic ,  Of 4 months duration.  She is not offloading them enough.  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  Th eother option is to get a motorized scooter, if  Medicaid will approve it.

## 2018-03-27 NOTE — Assessment & Plan Note (Addendum)
Loss of control due to dietary nonadherence..  Using mixed nsulin twice daily 30 units in the am,.  36 in the evening, and 40 units of Levemir daily.  She has been advised  to submit BS readings in 2 weeks so I can adjust doses   Lab Results  Component Value Date   HGBA1C 8.7 (H) 03/25/2018

## 2018-03-27 NOTE — Assessment & Plan Note (Addendum)
Elevated today .  Resuming lisinopril. Continue amlodipine

## 2018-03-28 ENCOUNTER — Encounter (INDEPENDENT_AMBULATORY_CARE_PROVIDER_SITE_OTHER): Payer: Self-pay | Admitting: Vascular Surgery

## 2018-03-29 ENCOUNTER — Ambulatory Visit: Payer: Medicare Other

## 2018-04-05 ENCOUNTER — Encounter (INDEPENDENT_AMBULATORY_CARE_PROVIDER_SITE_OTHER): Payer: Self-pay

## 2018-04-07 ENCOUNTER — Other Ambulatory Visit: Payer: Self-pay | Admitting: Internal Medicine

## 2018-04-19 DIAGNOSIS — L97521 Non-pressure chronic ulcer of other part of left foot limited to breakdown of skin: Secondary | ICD-10-CM | POA: Diagnosis not present

## 2018-04-20 ENCOUNTER — Encounter: Payer: Self-pay | Admitting: Urology

## 2018-04-20 ENCOUNTER — Ambulatory Visit (INDEPENDENT_AMBULATORY_CARE_PROVIDER_SITE_OTHER): Payer: Medicare Other | Admitting: Urology

## 2018-04-20 ENCOUNTER — Encounter

## 2018-04-20 ENCOUNTER — Other Ambulatory Visit (INDEPENDENT_AMBULATORY_CARE_PROVIDER_SITE_OTHER): Payer: Self-pay | Admitting: Vascular Surgery

## 2018-04-20 VITALS — BP 149/68 | HR 80 | Ht 67.0 in | Wt 158.0 lb

## 2018-04-20 DIAGNOSIS — C679 Malignant neoplasm of bladder, unspecified: Secondary | ICD-10-CM | POA: Diagnosis not present

## 2018-04-20 DIAGNOSIS — R319 Hematuria, unspecified: Secondary | ICD-10-CM | POA: Diagnosis not present

## 2018-04-20 LAB — URINALYSIS, COMPLETE
BILIRUBIN UA: NEGATIVE
GLUCOSE, UA: NEGATIVE
KETONES UA: NEGATIVE
Leukocytes, UA: NEGATIVE
NITRITE UA: NEGATIVE
SPEC GRAV UA: 1.015 (ref 1.005–1.030)
UUROB: 0.2 mg/dL (ref 0.2–1.0)
pH, UA: 6.5 (ref 5.0–7.5)

## 2018-04-20 LAB — MICROSCOPIC EXAMINATION: EPITHELIAL CELLS (NON RENAL): NONE SEEN /HPF (ref 0–10)

## 2018-04-20 NOTE — Progress Notes (Signed)
   04/20/18  CC:  Chief Complaint  Patient presents with  . Cysto    HPI: 57 yo F with with history of pT1N0 left upper tract urothelial carcinoma s/p nephroU 4/016 with low grade Ta bladder recurrence s/p TURBT, mitomycin, right retrograde pyelogram on 2/18.    UA today is fairly unremarkable.  No signs or symptoms of infection.  1- Left Upper Tract Transitional Cell Carcinoma/ Bladder cancer recurrence- s/p LEFT robotic nephroureterectomy on 10/31/2014 as well as combined ventral hernia repair with Dr. Bary Castilla. Post complicated by wound infection now well healed. Pathology pT1N0 with negative margins.  Recent Surveillance: 02/2015 - cysto NED; 05/2015 cysto NED, 08/2015- cysto NED, 11/2015- NED, CT Urogram 11/12/15 negative, poor quality of delayed phase.; 5/2-17- cysto with mild erythema following recent UTI; 02/2016 NED; 08/2016 Lg Ta TCC recurrence, 1 cm, multifocal. RTG neg.  08/2016- TURBT for bladder 1 cm recurrence near bladder neck and left UO, multifocal. R RTG negative. Mitomycin. LgTa. 10/2016- Induction BCG x 6 08/2017- Maintenance BCG x 3  2 - Solitary Kidney - s/p left nephrectomy as per above. Most recent Cr 0.89 09/2017  She does have an extensive smoking history, quit 5years ago but smoked up to 2 packs a day for 35 years. She also has multiple medical comorbidities including history of diabetes, CAD status post CABG, carotid endarterectomy.    NED. A&Ox3.   No respiratory distress   Abd soft, NT, ND Normal external genitalia with patent urethral meatus  Cystoscopy Procedure Note  Patient identification was confirmed, informed consent was obtained, and patient was prepped using Betadine solution.  Lidocaine jelly was administered per urethral meatus.    Preoperative abx where received prior to procedure.    Procedure: - Flexible cystoscope introduced, without any difficulty.   - Thorough search of the bladder revealed:    normal urethral meatus  normal urothelium with some changes on the posterior wall of the bladder most consistent with cystitis cystica (unchanged, previous cytology negative).  No evidence of papillary tumor.      no stones    no ulcers     no tumors    no urethral polyps    no trabeculation  - Right UO normal. Left UO was surgically absent.  Post-Procedure: - Patient tolerated the procedure well  Assessment/ Plan:  1. Malignant neoplasm of urinary bladder, unspecified site ([place]) No evidence of disease today on cystoscopy We will continue to follow areas which appear to be most consistent with cystitis cystica closely, unchanged from last visit with previously negative cytology Urine cytology today Plan for maintenance BCG with a split dose in the near future She is agreeable to this plan. - Urinalysis, Complete - ciprofloxacin (CIPRO) tablet 500 mg; Take 1 tablet (500 mg total) by mouth once. - lidocaine (XYLOCAINE) 2 % jelly 1 application; Place 1 application into the urethra once.   Return in about 3 months (around 07/21/2018) for cysto.   Hollice Espy, MD

## 2018-04-27 ENCOUNTER — Encounter
Admission: RE | Admit: 2018-04-27 | Discharge: 2018-04-27 | Disposition: A | Discharge status: Home / Self Care | Payer: Medicare Other | Source: Ambulatory Visit | Attending: Vascular Surgery | Admitting: Vascular Surgery

## 2018-04-27 ENCOUNTER — Telehealth (INDEPENDENT_AMBULATORY_CARE_PROVIDER_SITE_OTHER): Payer: Self-pay

## 2018-04-27 DIAGNOSIS — Z01812 Encounter for preprocedural laboratory examination: Secondary | ICD-10-CM | POA: Diagnosis not present

## 2018-04-27 LAB — CREATININE, SERUM
CREATININE: 0.99 mg/dL (ref 0.44–1.00)
GFR calc Af Amer: >60 mL/min (ref >60)
GFR calc non Af Amer: >60 mL/min (ref >60)

## 2018-04-27 LAB — BUN: BUN: 28 mg/dL — AB (ref 6–20)

## 2018-04-27 NOTE — Telephone Encounter (Signed)
I attempted to contact the patient to inform her that her procedure was being rescheduled to either Friday or the week of 11/4. I had to leave a message for a return call.

## 2018-04-27 NOTE — Telephone Encounter (Signed)
I called the patient's mobile number and left a message as well as her sister's number and left a message for a return call.

## 2018-04-27 NOTE — Pre-Procedure Instructions (Signed)
Patient has instructions from the office.

## 2018-04-28 ENCOUNTER — Other Ambulatory Visit: Payer: Self-pay | Admitting: Internal Medicine

## 2018-04-28 ENCOUNTER — Ambulatory Visit: Admission: RE | Admit: 2018-04-28 | Payer: Medicare Other | Source: Ambulatory Visit | Admitting: Vascular Surgery

## 2018-04-28 ENCOUNTER — Encounter (INDEPENDENT_AMBULATORY_CARE_PROVIDER_SITE_OTHER): Payer: Self-pay

## 2018-04-29 ENCOUNTER — Other Ambulatory Visit: Payer: Self-pay | Admitting: Urology

## 2018-04-29 IMAGING — CT CT ABDOMEN WO/W CM
2 of 4 series · 12 of 32 positions shown, 18 images · IV contrast (isovue)
Comparison: CT scan 02/07/2015

CLINICAL DATA: Restaging transitional cell carcinoma of the left
kidney, status post left nephrectomy.

EXAM:
CT ABDOMEN WITHOUT AND WITH CONTRAST
TECHNIQUE: Multidetector CT imaging of the abdomen was performed following the
standard protocol before and following the bolus administration of
intravenous contrast.
CONTRAST:  100 cc Isovue 370

[Series 5: axial arterial · axial · arterial · 0.69mm/px · z∈[-744,-552]mm · 9 of 81 slices shown, 15 images]
[im 9/81  soft-tissue]
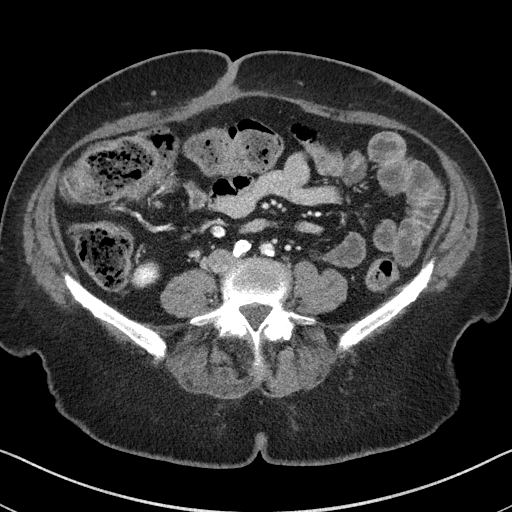
[im 9/81  bone]
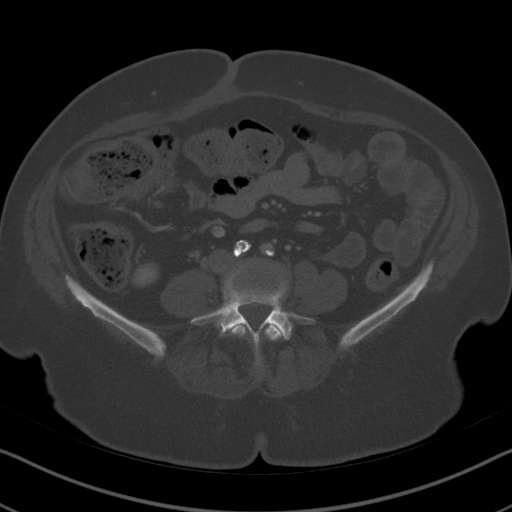
[im 17/81  soft-tissue]
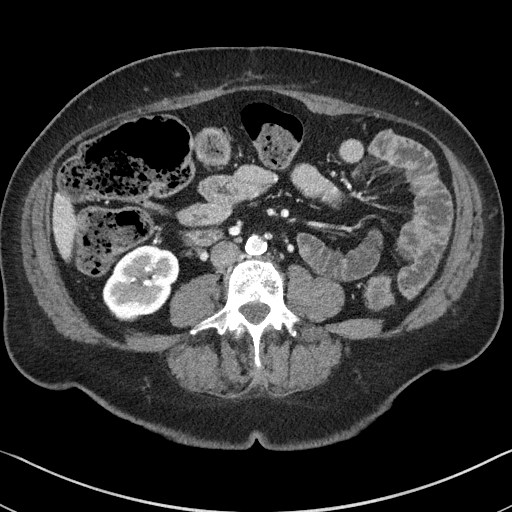
[im 25/81  soft-tissue]
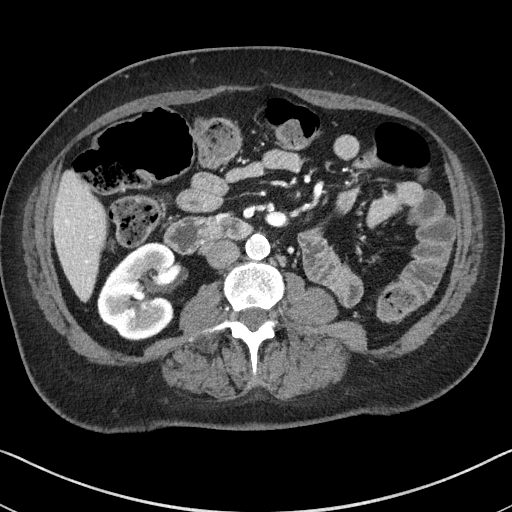
[im 33/81  soft-tissue]
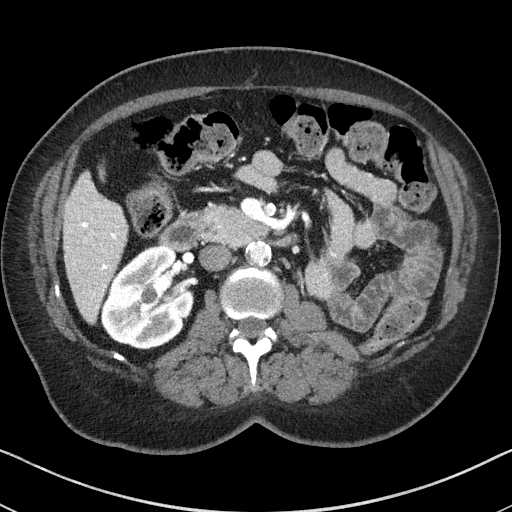
[im 41/81  soft-tissue]
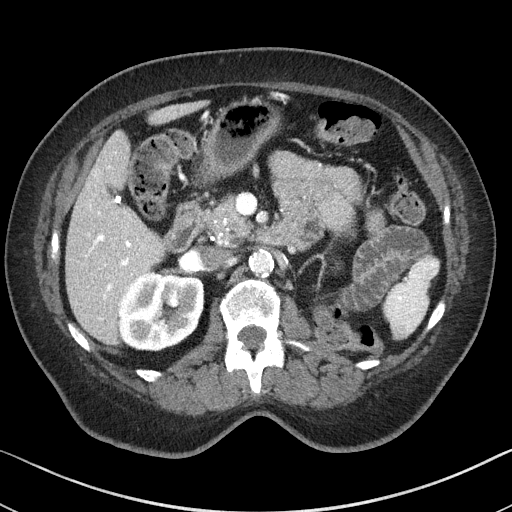
[im 49/81  soft-tissue]
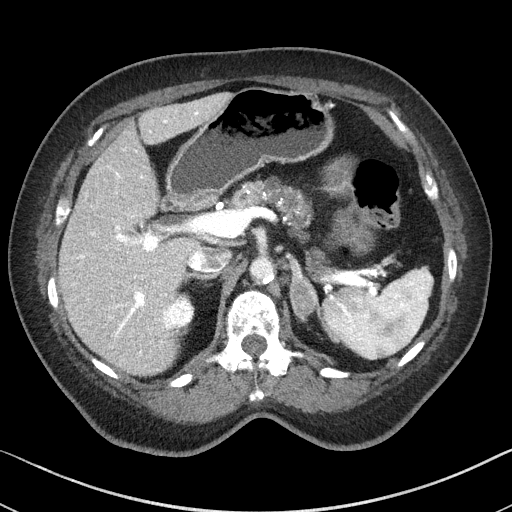
[im 49/81  lung]
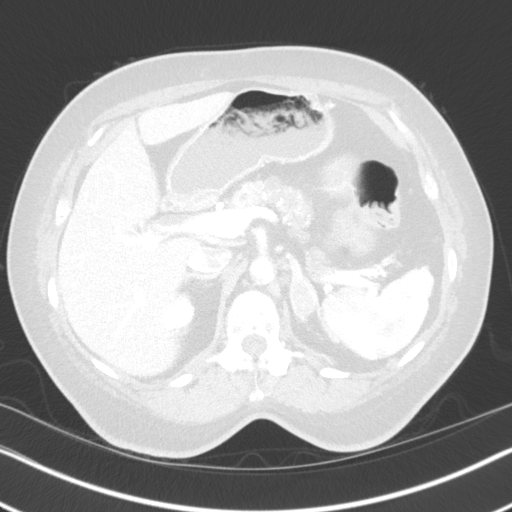
[im 57/81  soft-tissue]
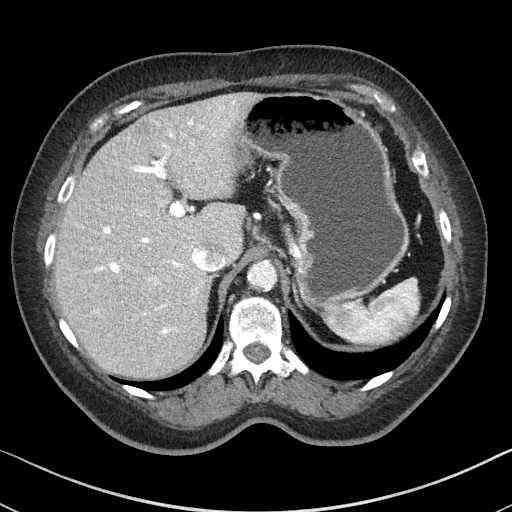
[im 57/81  lung]
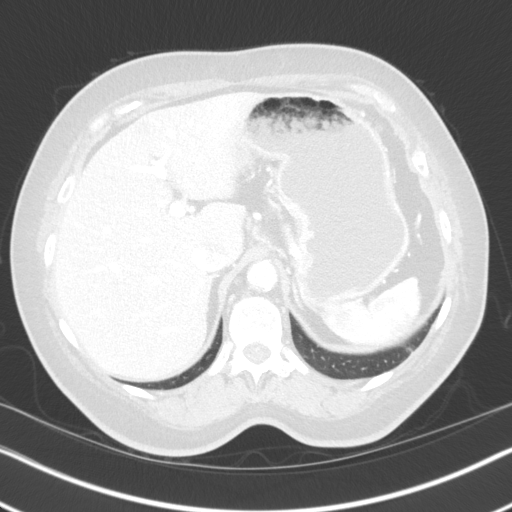
[im 65/81  soft-tissue]
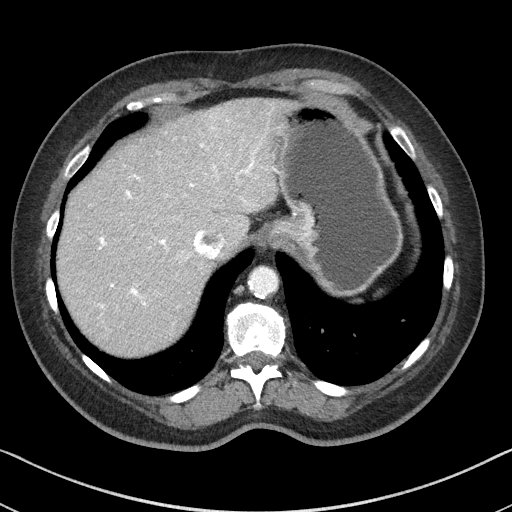
[im 65/81  lung]
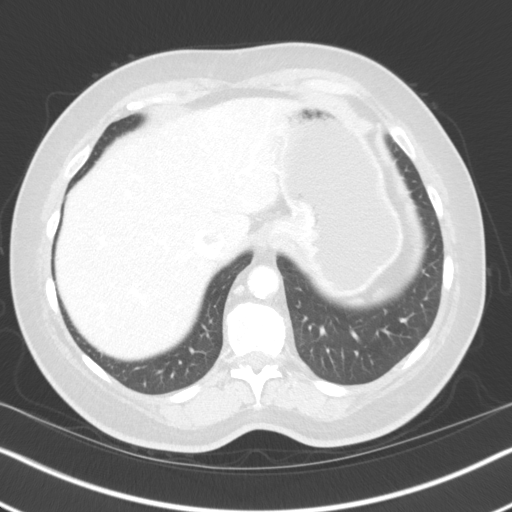
[im 73/81  soft-tissue]
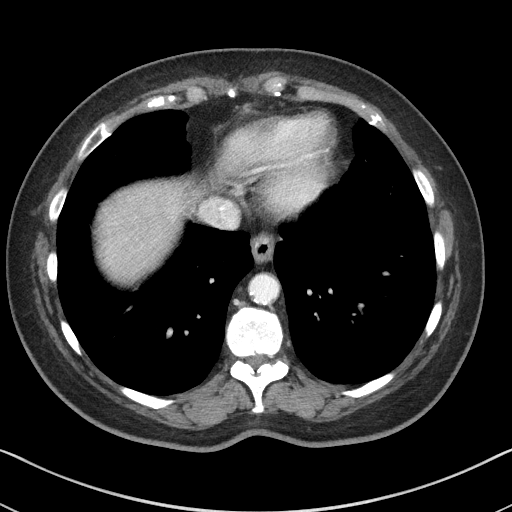
[im 73/81  lung]
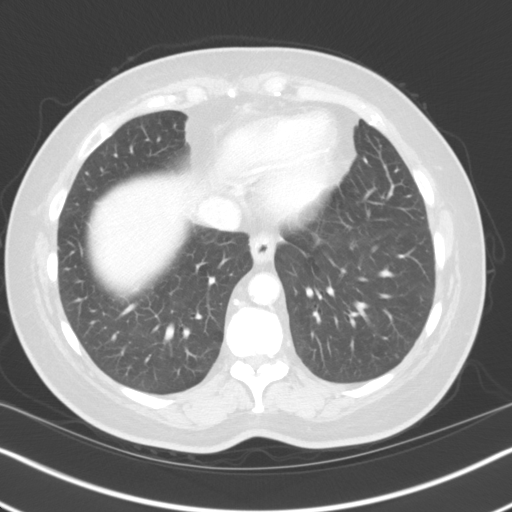
[im 73/81  bone]
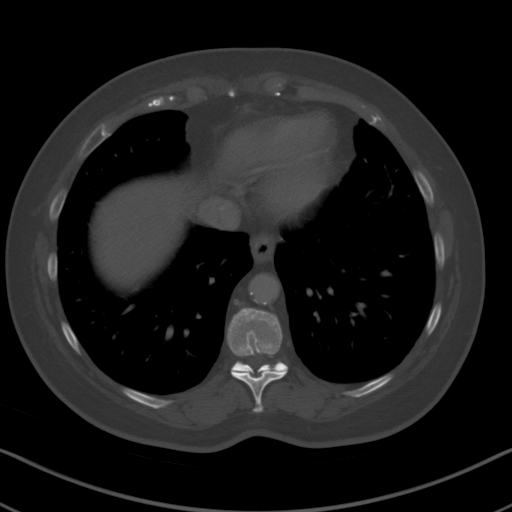

[Series 7: axial nephro · axial · 0.69mm/px · z∈[-744,-696]mm · 3 of 81 slices shown]
[im 9/81  soft-tissue]
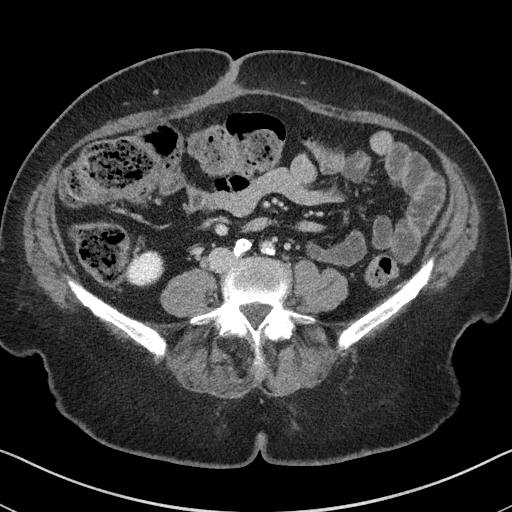
[im 17/81  soft-tissue]
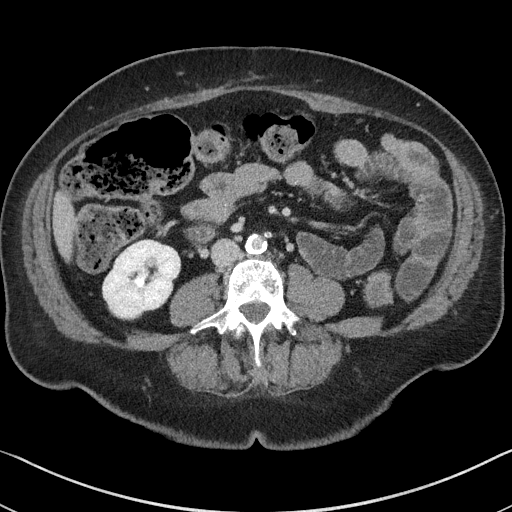
[im 25/81  soft-tissue]
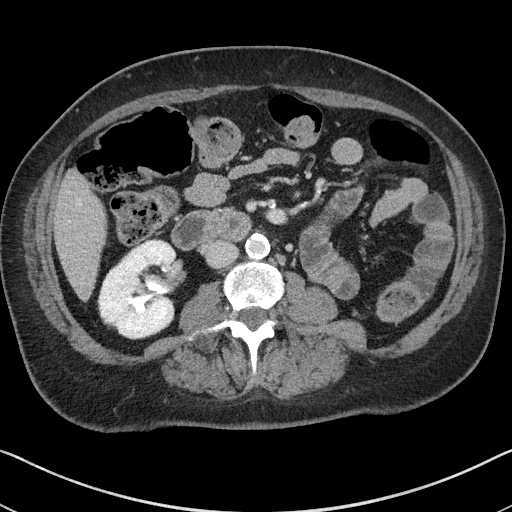

[12 of 32 positions shown; findings below may reference images not displayed]

FINDINGS: Lower chest: The lung bases are clear of acute process. There is a
stable nodule adjacent to the right major fissure which is likely a
lymph node. There is also a tiny, sub 3 mm right lower lobe on image
number 4 which is stable. A sub 3 mm subpleural nodule in the left
lower lobe on image number 13 is also stable. New line the heart is
normal in size. No pericardial effusion. Coronary artery
calcifications are noted. The distal esophagus is grossly normal.

Hepatobiliary: No focal hepatic lesions or intrahepatic biliary
dilatation. The gallbladder is surgically absent. No common bile
duct dilatation.

Pancreas: Changes of chronic calcific pancreatitis with marked
atrophy of the proximal body and tail the pancreas with dilatation
of the pancreatic duct. Stable areas of small cystic change in the
pancreatic head along with scattered calcifications. No definite
mass. No acute inflammation.

Spleen: Normal size.  No focal lesion.

Adrenals/Urinary Tract: Stable left adrenal gland nodule consistent
with benign adenoma. The right adrenal gland is normal.

The left kidney is surgically absent.

The right kidney demonstrates small scattered renal calculi. There
is also extensive renal artery calcifications. Normal enhancement/
perfusion. No worrisome renal lesions. No obvious collecting system
abnormalities. The upper right ureter appears normal.

Stomach/Bowel: The stomach, duodenum, visualized small bowel and
visualized colon are grossly normal. No inflammatory changes, mass
lesions or obstructive findings.

Vascular/Lymphatic: Advanced atherosclerotic calcifications
involving the aorta and branch vessels. No focal aneurysm or
dissection. No mesenteric or retroperitoneal mass or adenopathy.

Other: No ascites or abdominal wall hernia.

Musculoskeletal: No significant bony findings. No evidence of
osseous metastatic disease.
IMPRESSION: 1. Status post left nephrectomy. No findings for residual or
recurrent tumor or metastatic disease.
2. Scattered right renal calculi.  No right-sided renal mass.
3. Stable small bilateral pulmonary nodules the lung bases.
4. Advanced atherosclerotic calcifications involving the aorta and
branch vessels.
5. Stable changes of chronic calcific pancreatitis.
6. Stable left adrenal gland adenoma.

## 2018-05-03 ENCOUNTER — Other Ambulatory Visit (INDEPENDENT_AMBULATORY_CARE_PROVIDER_SITE_OTHER): Payer: Self-pay | Admitting: Vascular Surgery

## 2018-05-04 MED ORDER — CEFAZOLIN SODIUM-DEXTROSE 2-4 GM/100ML-% IV SOLN
2.0000 g | Freq: Once | INTRAVENOUS | Status: AC
  Administered 2018-05-05: 2 g via INTRAVENOUS

## 2018-05-05 ENCOUNTER — Ambulatory Visit: Payer: Medicare Other | Admitting: Anesthesiology

## 2018-05-05 ENCOUNTER — Ambulatory Visit
Admission: RE | Admit: 2018-05-05 | Discharge: 2018-05-05 | Disposition: A | Discharge status: Home / Self Care | Payer: Medicare Other | Source: Ambulatory Visit | Attending: Vascular Surgery | Admitting: Vascular Surgery

## 2018-05-05 ENCOUNTER — Encounter
Admission: RE | Disposition: A | Discharge status: Home / Self Care | Payer: Self-pay | Source: Ambulatory Visit | Attending: Vascular Surgery

## 2018-05-05 DIAGNOSIS — I509 Heart failure, unspecified: Secondary | ICD-10-CM | POA: Insufficient documentation

## 2018-05-05 DIAGNOSIS — Z9049 Acquired absence of other specified parts of digestive tract: Secondary | ICD-10-CM | POA: Diagnosis not present

## 2018-05-05 DIAGNOSIS — Z7982 Long term (current) use of aspirin: Secondary | ICD-10-CM | POA: Diagnosis not present

## 2018-05-05 DIAGNOSIS — Z833 Family history of diabetes mellitus: Secondary | ICD-10-CM | POA: Diagnosis not present

## 2018-05-05 DIAGNOSIS — R011 Cardiac murmur, unspecified: Secondary | ICD-10-CM | POA: Diagnosis not present

## 2018-05-05 DIAGNOSIS — Z9841 Cataract extraction status, right eye: Secondary | ICD-10-CM | POA: Insufficient documentation

## 2018-05-05 DIAGNOSIS — Z951 Presence of aortocoronary bypass graft: Secondary | ICD-10-CM | POA: Insufficient documentation

## 2018-05-05 DIAGNOSIS — Z9889 Other specified postprocedural states: Secondary | ICD-10-CM | POA: Diagnosis not present

## 2018-05-05 DIAGNOSIS — Z89429 Acquired absence of other toe(s), unspecified side: Secondary | ICD-10-CM | POA: Diagnosis not present

## 2018-05-05 DIAGNOSIS — Z87891 Personal history of nicotine dependence: Secondary | ICD-10-CM | POA: Diagnosis not present

## 2018-05-05 DIAGNOSIS — I6523 Occlusion and stenosis of bilateral carotid arteries: Secondary | ICD-10-CM | POA: Insufficient documentation

## 2018-05-05 DIAGNOSIS — Z955 Presence of coronary angioplasty implant and graft: Secondary | ICD-10-CM | POA: Insufficient documentation

## 2018-05-05 DIAGNOSIS — I2510 Atherosclerotic heart disease of native coronary artery without angina pectoris: Secondary | ICD-10-CM | POA: Diagnosis not present

## 2018-05-05 DIAGNOSIS — I70213 Atherosclerosis of native arteries of extremities with intermittent claudication, bilateral legs: Secondary | ICD-10-CM | POA: Insufficient documentation

## 2018-05-05 DIAGNOSIS — Z95820 Peripheral vascular angioplasty status with implants and grafts: Secondary | ICD-10-CM

## 2018-05-05 DIAGNOSIS — E1151 Type 2 diabetes mellitus with diabetic peripheral angiopathy without gangrene: Secondary | ICD-10-CM | POA: Diagnosis not present

## 2018-05-05 DIAGNOSIS — Z85528 Personal history of other malignant neoplasm of kidney: Secondary | ICD-10-CM | POA: Diagnosis not present

## 2018-05-05 DIAGNOSIS — I10 Essential (primary) hypertension: Secondary | ICD-10-CM | POA: Diagnosis not present

## 2018-05-05 DIAGNOSIS — E1140 Type 2 diabetes mellitus with diabetic neuropathy, unspecified: Secondary | ICD-10-CM | POA: Insufficient documentation

## 2018-05-05 DIAGNOSIS — I771 Stricture of artery: Secondary | ICD-10-CM | POA: Diagnosis not present

## 2018-05-05 DIAGNOSIS — I70212 Atherosclerosis of native arteries of extremities with intermittent claudication, left leg: Secondary | ICD-10-CM | POA: Diagnosis not present

## 2018-05-05 DIAGNOSIS — Z905 Acquired absence of kidney: Secondary | ICD-10-CM | POA: Insufficient documentation

## 2018-05-05 DIAGNOSIS — Z79899 Other long term (current) drug therapy: Secondary | ICD-10-CM | POA: Insufficient documentation

## 2018-05-05 DIAGNOSIS — I110 Hypertensive heart disease with heart failure: Secondary | ICD-10-CM | POA: Diagnosis not present

## 2018-05-05 DIAGNOSIS — E785 Hyperlipidemia, unspecified: Secondary | ICD-10-CM | POA: Diagnosis not present

## 2018-05-05 DIAGNOSIS — Z794 Long term (current) use of insulin: Secondary | ICD-10-CM | POA: Insufficient documentation

## 2018-05-05 DIAGNOSIS — Z801 Family history of malignant neoplasm of trachea, bronchus and lung: Secondary | ICD-10-CM | POA: Insufficient documentation

## 2018-05-05 DIAGNOSIS — Z7902 Long term (current) use of antithrombotics/antiplatelets: Secondary | ICD-10-CM

## 2018-05-05 DIAGNOSIS — I70219 Atherosclerosis of native arteries of extremities with intermittent claudication, unspecified extremity: Secondary | ICD-10-CM

## 2018-05-05 DIAGNOSIS — Z803 Family history of malignant neoplasm of breast: Secondary | ICD-10-CM | POA: Insufficient documentation

## 2018-05-05 DIAGNOSIS — Z7984 Long term (current) use of oral hypoglycemic drugs: Secondary | ICD-10-CM

## 2018-05-05 HISTORY — PX: LOWER EXTREMITY ANGIOGRAPHY: CATH118251

## 2018-05-05 LAB — GLUCOSE, CAPILLARY
GLUCOSE-CAPILLARY: 74 mg/dL (ref 70–99)
Glucose-Capillary: 106 mg/dL — ABNORMAL HIGH (ref 70–99)

## 2018-05-05 SURGERY — LOWER EXTREMITY ANGIOGRAPHY
Anesthesia: General | Laterality: Left

## 2018-05-05 MED ORDER — FENTANYL CITRATE (PF) 100 MCG/2ML IJ SOLN
INTRAMUSCULAR | Status: AC
  Filled 2018-05-05: qty 2

## 2018-05-05 MED ORDER — HEPARIN SODIUM (PORCINE) 1000 UNIT/ML IJ SOLN
INTRAMUSCULAR | Status: DC | PRN
  Administered 2018-05-05: 4000 [IU] via INTRAVENOUS

## 2018-05-05 MED ORDER — CEFAZOLIN SODIUM-DEXTROSE 2-4 GM/100ML-% IV SOLN: 2.0000 g | Freq: Once | INTRAVENOUS | Status: DC

## 2018-05-05 MED ORDER — ONDANSETRON HCL 4 MG/2ML IJ SOLN: 4.0000 mg | Freq: Four times a day (QID) | INTRAMUSCULAR | Status: DC | PRN

## 2018-05-05 MED ORDER — PHENYLEPHRINE HCL 10 MG/ML IJ SOLN
INTRAMUSCULAR | Status: DC | PRN
  Administered 2018-05-05 (×2): 50 ug via INTRAVENOUS
  Administered 2018-05-05: 100 ug via INTRAVENOUS

## 2018-05-05 MED ORDER — SODIUM CHLORIDE 0.9 % IV SOLN: INTRAVENOUS | Status: DC

## 2018-05-05 MED ORDER — FENTANYL CITRATE (PF) 100 MCG/2ML IJ SOLN: 25.0000 ug | INTRAMUSCULAR | Status: DC | PRN

## 2018-05-05 MED ORDER — MIDAZOLAM HCL 2 MG/2ML IJ SOLN
INTRAMUSCULAR | Status: AC
  Filled 2018-05-05: qty 2

## 2018-05-05 MED ORDER — ONDANSETRON HCL 4 MG/2ML IJ SOLN
INTRAMUSCULAR | Status: DC | PRN
  Administered 2018-05-05: 4 mg via INTRAVENOUS

## 2018-05-05 MED ORDER — PROPOFOL 10 MG/ML IV BOLUS
INTRAVENOUS | Status: DC | PRN
  Administered 2018-05-05: 140 mg via INTRAVENOUS

## 2018-05-05 MED ORDER — FENTANYL CITRATE (PF) 100 MCG/2ML IJ SOLN
INTRAMUSCULAR | Status: DC | PRN
  Administered 2018-05-05: 50 ug via INTRAVENOUS

## 2018-05-05 MED ORDER — PHENYLEPHRINE HCL 10 MG/ML IJ SOLN
INTRAMUSCULAR | Status: AC
  Filled 2018-05-05: qty 1

## 2018-05-05 MED ORDER — LIDOCAINE HCL (CARDIAC) PF 100 MG/5ML IV SOSY
PREFILLED_SYRINGE | INTRAVENOUS | Status: DC | PRN
  Administered 2018-05-05: 60 mg via INTRAVENOUS

## 2018-05-05 MED ORDER — FAMOTIDINE 20 MG PO TABS: 40.0000 mg | ORAL_TABLET | ORAL | Status: DC | PRN

## 2018-05-05 MED ORDER — LIDOCAINE HCL (PF) 2 % IJ SOLN
INTRAMUSCULAR | Status: AC
  Filled 2018-05-05: qty 10

## 2018-05-05 MED ORDER — METHYLPREDNISOLONE SODIUM SUCC 125 MG IJ SOLR: 125.0000 mg | INTRAMUSCULAR | Status: DC | PRN

## 2018-05-05 MED ORDER — SODIUM CHLORIDE 0.9 % IV SOLN
INTRAVENOUS | Status: DC
  Administered 2018-05-05: 07:00:00 via INTRAVENOUS

## 2018-05-05 MED ORDER — PROPOFOL 10 MG/ML IV BOLUS
INTRAVENOUS | Status: AC
  Filled 2018-05-05: qty 20

## 2018-05-05 MED ORDER — HYDROMORPHONE HCL 1 MG/ML IJ SOLN: 1.0000 mg | Freq: Once | INTRAMUSCULAR | Status: DC | PRN

## 2018-05-05 MED ORDER — OXYCODONE HCL 5 MG PO TABS: 5.0000 mg | ORAL_TABLET | Freq: Once | ORAL | Status: DC | PRN

## 2018-05-05 MED ORDER — IOPAMIDOL (ISOVUE-300) INJECTION 61%
INTRAVENOUS | Status: DC | PRN
  Administered 2018-05-05: 45 mL via INTRA_ARTERIAL

## 2018-05-05 MED ORDER — ONDANSETRON HCL 4 MG/2ML IJ SOLN
INTRAMUSCULAR | Status: AC
  Filled 2018-05-05: qty 2

## 2018-05-05 MED ORDER — SODIUM CHLORIDE 0.9 % IV SOLN
INTRAVENOUS | Status: DC | PRN
  Administered 2018-05-05: 40 ug/min via INTRAVENOUS

## 2018-05-05 MED ORDER — HEPARIN SODIUM (PORCINE) 1000 UNIT/ML IJ SOLN
INTRAMUSCULAR | Status: AC
  Filled 2018-05-05: qty 1

## 2018-05-05 MED ORDER — HEPARIN (PORCINE) IN NACL 1000-0.9 UT/500ML-% IV SOLN
INTRAVENOUS | Status: AC
  Filled 2018-05-05: qty 1000

## 2018-05-05 MED ORDER — MIDAZOLAM HCL 2 MG/2ML IJ SOLN
INTRAMUSCULAR | Status: DC | PRN
  Administered 2018-05-05: 2 mg via INTRAVENOUS

## 2018-05-05 MED ORDER — LIDOCAINE HCL (PF) 1 % IJ SOLN
INTRAMUSCULAR | Status: AC
  Filled 2018-05-05: qty 30

## 2018-05-05 MED ORDER — OXYCODONE HCL 5 MG/5ML PO SOLN
5.0000 mg | Freq: Once | ORAL | Status: DC | PRN
  Filled 2018-05-05: qty 5

## 2018-05-05 SURGICAL SUPPLY — 26 items
BALLN LUTONIX DCB 5X60X130 (BALLOONS) ×3
BALLN LUTONIX DCB 6X80X130 (BALLOONS) ×3
BALLN ULTRASCORE 014 3X150X150 (BALLOONS) ×3
BALLN ULTRVRSE 6X40X130 (BALLOONS) ×2
BALLN ULTRVRSE 6X40X130 OTE (BALLOONS) ×1
BALLOON LUTONIX DCB 5X60X130 (BALLOONS) ×1 IMPLANT
BALLOON LUTONIX DCB 6X80X130 (BALLOONS) ×1 IMPLANT
BALLOON ULTRSCRE 014 3X150X150 (BALLOONS) ×1 IMPLANT
BALLOON ULTRVRSE 6X40X130 OTE (BALLOONS) ×1 IMPLANT
CATH BEACON 5 .035 65 KMP TIP (CATHETERS) ×3 IMPLANT
CATH PIG 70CM (CATHETERS) ×3 IMPLANT
DEVICE CLOSURE MYNXGRIP 5F (Vascular Products) ×6 IMPLANT
DEVICE PRESTO INFLATION (MISCELLANEOUS) ×3 IMPLANT
DRAPE BRACHIAL (DRAPES) ×3 IMPLANT
GLIDEWIRE ADV .035X180CM (WIRE) ×3 IMPLANT
NEEDLE ENTRY 21GA 7CM ECHOTIP (NEEDLE) ×3 IMPLANT
PACK ANGIOGRAPHY (CUSTOM PROCEDURE TRAY) ×3 IMPLANT
SET INTRO CAPELLA COAXIAL (SET/KITS/TRAYS/PACK) ×3 IMPLANT
SHEATH BRITE TIP 5FRX11 (SHEATH) ×6 IMPLANT
SHIELD X-DRAPE GOLD 12X17 (MISCELLANEOUS) ×3 IMPLANT
STENT LIFESTENT 5F 6X80X135 (Permanent Stent) ×3 IMPLANT
SYR MEDRAD MARK V 150ML (SYRINGE) ×3 IMPLANT
TUBING CONTRAST HIGH PRESS 72 (TUBING) ×3 IMPLANT
WIRE J 3MM .035X145CM (WIRE) ×3 IMPLANT
WIRE MAGIC TORQUE 260C (WIRE) ×3 IMPLANT
WIRE SPARTACORE .014X300CM (WIRE) ×3 IMPLANT

## 2018-05-05 NOTE — Anesthesia Preprocedure Evaluation (Signed)
Anesthesia Evaluation  Patient identified by MRN, date of birth, ID band Patient awake    Reviewed: Allergy & Precautions, H&P , NPO status , Patient's Chart, lab work & pertinent test results  History of Anesthesia Complications Negative for: history of anesthetic complications  Airway Mallampati: III  TM Distance: <3 FB Neck ROM: limited    Dental  (+) Chipped, Upper Dentures, Lower Dentures   Pulmonary neg shortness of breath, former smoker,           Cardiovascular Exercise Tolerance: Good hypertension, (-) angina+ CAD, + CABG, + Peripheral Vascular Disease and +CHF  (-) Past MI      Neuro/Psych PSYCHIATRIC DISORDERS  Neuromuscular disease    GI/Hepatic negative GI ROS, Neg liver ROS,   Endo/Other  diabetes, Type 2  Renal/GU Renal disease     Musculoskeletal   Abdominal   Peds  Hematology negative hematology ROS (+)   Anesthesia Other Findings Past Medical History: No date: Anxiety No date: Bladder cancer (HCC) No date: CHF (congestive heart failure) (HCC) No date: Coronary artery disease No date: Diabetes mellitus No date: Heart murmur No date: Hemorrhoid No date: Hypertension No date: Neuropathy No date: PVD (peripheral vascular disease) ([place]) 10/31/2014: Urothelial carcinoma of kidney ([place])     Comment:  INVASIVE UROTHELIAL CARCINOMA, LOW GRADE. T1, Nx.  Past Surgical History: No date: AMPUTATION TOE 2009, 2013 x 2: ARTERIAL BYPASS SURGRY     Comment:  right leg , done in [place] No date: CARDIAC CATHETERIZATION 01/2014: CAROTID ENDARTERECTOMY; Right     Comment:  Dr Delana Meyer 12/14/2014: CATARACT EXTRACTION W/PHACO; Right     Comment:  Procedure: CATARACT EXTRACTION PHACO AND INTRAOCULAR               LENS PLACEMENT ([place]);  Surgeon: Lyla Glassing, MD;                Location: [facility];  Service: Ophthalmology;  Laterality:              Right;  [place]   00:38.6              AP        7.1                       CDE  2.76 No date: CESAREAN SECTION 03-03-12: CHOLECYSTECTOMY     Comment:  Porcelain gallbladder, gallstones,  [place] 04/28/2012: COLONOSCOPY W/ BIOPSIES     Comment:  Hyperplastic rectal polyps. 2009: CORONARY ARTERY BYPASS GRAFT     Comment:  3 vessel 09/01/2016: CYSTOSCOPY W/ RETROGRADES; Right     Comment:  Procedure: CYSTOSCOPY WITH RETROGRADE PYELOGRAM;                Surgeon: Hollice Espy, MD;  Location: [facility];                Service: Urology;  Laterality: Right; 10-31-14: HERNIA REPAIR     Comment:  ventral, retro-rectus atrium mesh 12/10/2016: LOWER EXTREMITY ANGIOGRAPHY; Left     Comment:  Procedure: Lower Extremity Angiography;  Surgeon:               Katha Cabal, MD;  Location: [place] [facility];               Service: Cardiovascular;  Laterality: Left; 02/02/2018: LOWER EXTREMITY ANGIOGRAPHY; Left     Comment:  Procedure: LOWER EXTREMITY ANGIOGRAPHY;  Surgeon:  [name], Dolores Lory, MD;  Location: [place] [facility];               Service: Cardiovascular;  Laterality: Left; 10-31-14: NEPHRECTOMY; Left 05/01/2015: PERIPHERAL VASCULAR CATHETERIZATION; Left     Comment:  Procedure: Lower Extremity Angiography;  Surgeon:               Katha Cabal, MD;  Location: [place] [facility];                Service: Cardiovascular;  Laterality: Left; 05/01/2015: PERIPHERAL VASCULAR CATHETERIZATION     Comment:  Procedure: Lower Extremity Intervention;  Surgeon:               Katha Cabal, MD;  Location: [place] [facility];                Service: Cardiovascular;; 02/20/2015: PERIPHERAL VASCULAR CATHETERIZATION; Left     Comment:  Procedure: Pelvic Angiography;  Surgeon: Katha Cabal, MD;  Location: [place] [facility];  Service:               Cardiovascular;  Laterality: Left; 09/01/2016: TRANSURETHRAL RESECTION OF BLADDER TUMOR WITH MITOMYCIN-C;  N/A     Comment:  Procedure: TRANSURETHRAL RESECTION OF BLADDER TUMOR  WITH              MITOMYCIN-C;  Surgeon: Hollice Espy, MD;  Location:               [facility];  Service: Urology;  Laterality: N/A;  BMI    Body Mass Index:  25.55 kg/m      Reproductive/Obstetrics negative OB ROS                             Anesthesia Physical Anesthesia Plan  ASA: III  Anesthesia Plan: General LMA   Post-op Pain Management:    Induction: Intravenous  PONV Risk Score and Plan: Ondansetron, Dexamethasone, Midazolam and Treatment may vary due to age or medical condition  Airway Management Planned: LMA  Additional Equipment:   Intra-op Plan:   Post-operative Plan: Extubation in OR  Informed Consent: I have reviewed the patients History and Physical, chart, labs and discussed the procedure including the risks, benefits and alternatives for the proposed anesthesia with the patient or authorized representative who has indicated his/her understanding and acceptance.   Dental Advisory Given  Plan Discussed with: Anesthesiologist, CRNA and Surgeon  Anesthesia Plan Comments: (Patient consented for risks of anesthesia including but not limited to:  - adverse reactions to medications - damage to teeth, lips or other oral mucosa - sore throat or hoarseness - Damage to heart, brain, lungs or loss of life  Patient voiced understanding.)        Anesthesia Quick Evaluation

## 2018-05-05 NOTE — Anesthesia Post-op Follow-up Note (Signed)
Anesthesia QCDR form completed.        

## 2018-05-05 NOTE — Anesthesia Postprocedure Evaluation (Signed)
Anesthesia Post Note  Patient: Laura Reed  Procedure(s) Performed: LOWER EXTREMITY ANGIOGRAPHY (Left )  Patient location during evaluation: PACU Anesthesia Type: General Level of consciousness: awake and alert Pain management: pain level controlled Vital Signs Assessment: post-procedure vital signs reviewed and stable Respiratory status: spontaneous breathing, nonlabored ventilation, respiratory function stable and patient connected to nasal cannula oxygen Cardiovascular status: blood pressure returned to baseline and stable Postop Assessment: no apparent nausea or vomiting Anesthetic complications: no     Last Vitals:  Vitals:   05/05/18 1100 05/05/18 1130  BP: (!) 135/49 (!) 129/46  Pulse: 67 66  Resp: 18 20  Temp:    SpO2: 98% 97%    Last Pain:  Vitals:   05/05/18 1130  TempSrc:   PainSc: 0-No pain                 Precious Haws Piscit[place]

## 2018-05-05 NOTE — H&P (Signed)
@LOGO @   MRN : [id number]  Laura Reed is a 57 y.o. (May 31, 1961) female who presents with chief complaint of severe claudication.  History of Present Illness:   The patient is status post angiogram with intervention on 02/02/2018.   Procedure(s) Performed: 1. Abdominal aortogram 2. Bilateral lower extremity distal runoff 3. Right lower extremity third order catheter placement 4. Percutaneous transluminal angioplasty and stent placement left common iliac artery 5. Percutaneous transluminal angioplasty and stent placement left external iliac artery 6. Percutaneous transluminal angioplasty left common femoral 7. Minx closure left femoral-popliteal bypass graft   The patient presents today for further intervention. There has been persistent deterioration in the lower extremity symptoms. The patient notes interval shortening of their claudication distance and development of mild rest pain symptoms. No new ulcers or wounds have occurred since the last visit.  There have been no significant changes to the patient's overall health care.  The patient denies amaurosis fugax or recent TIA symptoms. There are no recent neurological changes noted. The patient denies history of DVT, PE or superficial thrombophlebitis. The patient denies recent episodes of angina or shortness of breath.   Current Meds  Medication Sig  . ACCU-CHEK AVIVA PLUS test strip TEST BLOOD SUGAR 2 TO 3 TIMES DAILY  . ACCU-CHEK AVIVA PLUS test strip TEST BLOOD SUGAR 2 TO 3 TIMES DAILY  . acetaminophen (TYLENOL) 500 MG tablet Take 1,000 mg by mouth every 6 (six) hours as needed for mild pain or headache.   . ALPRAZolam (XANAX) 0.5 MG tablet Take 1 tablet (0.5 mg total) by mouth daily as needed. for anxiety  . amLODipine (NORVASC) 5 MG tablet TAKE 1 TABLET(5 MG) BY MOUTH DAILY  . aspirin 81 MG chewable tablet Chew 81 mg by mouth  daily.  Marland Kitchen atorvastatin (LIPITOR) 20 MG tablet TAKE 1 TABLET BY MOUTH EVERY DAY  . B-D ULTRAFINE III SHORT PEN 31G X 8 MM MISC TEST BLOOD SUGAR TWICE DAILY  . B-D ULTRAFINE III SHORT PEN 31G X 8 MM MISC TEST BLOOD SUGAR TWICE DAILY  . clopidogrel (PLAVIX) 75 MG tablet TAKE 1 TABLET BY MOUTH EVERY DAY  . DIGOX 125 MCG tablet TAKE 1 TABLET BY MOUTH EVERY DAY  . diphenhydrAMINE (BENADRYL) 25 mg capsule Take 25 mg by mouth at bedtime as needed for sleep.  Marland Kitchen gabapentin (NEURONTIN) 600 MG tablet TAKE 1 TABLET BY MOUTH THREE TIMES DAILY  . HUMALOG MIX 75/25 KWIKPEN (75-25) 100 UNIT/ML Kwikpen INJECT 30 UNITS UNDER THE SKIN EVERY MORNING AND 36 UNITS EVERY EVENING  . Lancets Misc. MISC Use to check blood sugars 3 times daily  . LEVEMIR FLEXTOUCH 100 UNIT/ML Pen INJECT 40 UNITS UNDER THE SKIN EVERY DAY AT 10 PM  . lisinopril (PRINIVIL,ZESTRIL) 5 MG tablet Take 1 tablet (5 mg total) by mouth daily.  Marland Kitchen neomycin-bacitracin-polymyxin (NEOSPORIN) ointment Apply 1 application topically as needed for wound care.  . TRUEPLUS LANCETS 28G MISC CHECK BLOOD SUGAR FOUR TIMES DAILY  . TRUEPLUS LANCETS 28G MISC CHECK BLOOD SUGAR FOUR TIMES DAILY    Past Medical History:  Diagnosis Date  . Anxiety   . Bladder cancer ([place])   . CHF (congestive heart failure) ([place])   . Coronary artery disease   . Diabetes mellitus   . Heart murmur   . Hemorrhoid   . Hypertension   . Neuropathy   . PVD (peripheral vascular disease) ([place])   . Urothelial carcinoma of kidney ([place]) 10/31/2014   INVASIVE UROTHELIAL CARCINOMA, LOW GRADE. T1, Nx.  Past Surgical History:  Procedure Laterality Date  . AMPUTATION TOE    . ARTERIAL BYPASS SURGRY  2009, 2013 x 2   right leg , done in [place]  . CARDIAC CATHETERIZATION    . CAROTID ENDARTERECTOMY Right 01/2014   Dr Delana Meyer  . CATARACT EXTRACTION W/PHACO Right 12/14/2014   Procedure: CATARACT EXTRACTION PHACO AND INTRAOCULAR LENS PLACEMENT (IOC);  Surgeon: Lyla Glassing, MD;   Location: [facility];  Service: Ophthalmology;  Laterality: Right;  [place]   00:38.6              AP        7.1                   CDE  2.76  . CESAREAN SECTION    . CHOLECYSTECTOMY  03-03-12   Porcelain gallbladder, gallstones,  [place]  . COLONOSCOPY W/ BIOPSIES  04/28/2012   Hyperplastic rectal polyps.  . CORONARY ARTERY BYPASS GRAFT  2009   3 vessel  . CYSTOSCOPY W/ RETROGRADES Right 09/01/2016   Procedure: CYSTOSCOPY WITH RETROGRADE PYELOGRAM;  Surgeon: Hollice Espy, MD;  Location: [facility];  Service: Urology;  Laterality: Right;  . HERNIA REPAIR  10-31-14   ventral, retro-rectus atrium mesh  . LOWER EXTREMITY ANGIOGRAPHY Left 12/10/2016   Procedure: Lower Extremity Angiography;  Surgeon: Katha Cabal, MD;  Location: [place] [facility];  Service: Cardiovascular;  Laterality: Left;  . LOWER EXTREMITY ANGIOGRAPHY Left 02/02/2018   Procedure: LOWER EXTREMITY ANGIOGRAPHY;  Surgeon: Katha Cabal, MD;  Location: [place] [facility];  Service: Cardiovascular;  Laterality: Left;  . NEPHRECTOMY Left 10-31-14  . PERIPHERAL VASCULAR CATHETERIZATION Left 05/01/2015   Procedure: Lower Extremity Angiography;  Surgeon: Katha Cabal, MD;  Location: [place] [facility];  Service: Cardiovascular;  Laterality: Left;  . PERIPHERAL VASCULAR CATHETERIZATION  05/01/2015   Procedure: Lower Extremity Intervention;  Surgeon: Katha Cabal, MD;  Location: [place] [facility];  Service: Cardiovascular;;  . PERIPHERAL VASCULAR CATHETERIZATION Left 02/20/2015   Procedure: Pelvic Angiography;  Surgeon: Katha Cabal, MD;  Location: [place] [facility];  Service: Cardiovascular;  Laterality: Left;  . TRANSURETHRAL RESECTION OF BLADDER TUMOR WITH MITOMYCIN-C N/A 09/01/2016   Procedure: TRANSURETHRAL RESECTION OF BLADDER TUMOR WITH MITOMYCIN-C;  Surgeon: Hollice Espy, MD;  Location: [facility];  Service: Urology;  Laterality: N/A;    Social History Social History   Tobacco Use  . Smoking status:  Former Smoker    Packs/day: 2.00    Years: 35.00    Pack years: 70.00    Types: Cigarettes    Last attempt to quit: 03/30/2011    Years since quitting: 7.1  . Smokeless tobacco: Never Used  Substance Use Topics  . Alcohol use: No    Alcohol/week: 0.0 standard drinks  . Drug use: No    Family History Family History  Problem Relation Age of Onset  . Cancer Mother 47       Lung Cancer  . Cancer Father 42       Lung Ca  . Diabetes Son   . Breast cancer Maternal Grandmother   . Kidney cancer Neg Hx   . Bladder Cancer Neg Hx   . Prostate cancer Neg Hx     No Known Allergies   REVIEW OF SYSTEMS (Negative unless checked)  Constitutional: [] Weight loss  [] Fever  [] Chills Cardiac: [] Chest pain   [] Chest pressure   [] Palpitations   [] Shortness of breath when laying flat   [] Shortness  of breath with exertion. Vascular:  [x] Pain in legs with walking   [] Pain in legs at rest  [] History of DVT   [] Phlebitis   [] Swelling in legs   [] Varicose veins   [] Non-healing ulcers Pulmonary:   [] Uses home oxygen   [] Productive cough   [] Hemoptysis   [] Wheeze  [] COPD   [] Asthma Neurologic:  [] Dizziness   [] Seizures   [] History of stroke   [] History of TIA  [] Aphasia   [] Vissual changes   [] Weakness or numbness in arm   [] Weakness or numbness in leg Musculoskeletal:   [] Joint swelling   [] Joint pain   [] Low back pain Hematologic:  [] Easy bruising  [] Easy bleeding   [] Hypercoagulable state   [] Anemic Gastrointestinal:  [] Diarrhea   [] Vomiting  [] Gastroesophageal reflux/heartburn   [] Difficulty swallowing. Genitourinary:  [] Chronic kidney disease   [] Difficult urination  [] Frequent urination   [] Blood in urine Skin:  [] Rashes   [] Ulcers  Psychological:  [] History of anxiety   []  History of major depression.  Physical Examination  Vitals:   05/05/18 0640  BP: (!) 131/53  Pulse: 61  Resp: 18  Temp: 98.1 F (36.7 C)  TempSrc: Oral  SpO2: 97%  Weight: 74 kg  Height: 5\' 7"  (1.702 m)   Body mass  index is 25.55 kg/m. Gen: WD/WN, NAD Head: [place]/AT, No temporalis wasting.  Ear/Nose/Throat: Hearing grossly intact, nares w/o erythema or drainage Eyes: PER, EOMI, sclera nonicteric.  Neck: Supple, no large masses.   Pulmonary:  Good air movement, no audible wheezing bilaterally, no use of accessory muscles.  Cardiac: RRR, no JVD Vascular:  Incisional scars consistent with bilateral fem pop bypass Vessel Right Left  Radial Palpable Palpable  PT Not Palpable Not Palpable  DP Not Palpable Not Palpable  Gastrointestinal: Non-distended. No guarding/no peritoneal signs.  Musculoskeletal: M/S 5/5 throughout.  No deformity or atrophy.  Neurologic: CN 2-12 intact. Symmetrical.  Speech is fluent. Motor exam as listed above. Psychiatric: Judgment intact, Mood & affect appropriate for pt's clinical situation. Dermatologic: No rashes or ulcers noted.  No changes consistent with cellulitis. Lymph : No lichenification or skin changes of chronic lymphedema.  CBC Lab Results  Component Value Date   WBC 10.5 02/18/2016   HGB 12.2 09/01/2016   HCT 36.0 09/01/2016   MCV 87.0 02/18/2016   PLT 266.0 02/18/2016    BMET    Component Value Date/Time   NA 136 03/25/2018 0915   NA 135 11/02/2014 0609   K 4.7 03/25/2018 0915   K 4.2 11/02/2014 0609   CL 101 03/25/2018 0915   CL 107 11/02/2014 0609   CO2 29 03/25/2018 0915   CO2 23 11/02/2014 0609   GLUCOSE 136 (H) 03/25/2018 0915   GLUCOSE 108 (H) 11/02/2014 0609   BUN 28 (H) 04/27/2018 0834   BUN 20 11/02/2014 0609   CREATININE 0.99 04/27/2018 0834   CREATININE 1.01 11/09/2015 1549   CREATININE 1.01 11/09/2015 1549   CALCIUM 9.6 03/25/2018 0915   CALCIUM 7.3 (L) 11/02/2014 0609   GFRNONAA >60 04/27/2018 0834   GFRNONAA 50 (L) 11/02/2014 0609   GFRAA >60 04/27/2018 0834   GFRAA 58 (L) 11/02/2014 0609   Estimated Creatinine Clearance: 64.3 mL/min (by C-G formula based on SCr of 0.99 mg/dL).  COAG Lab Results  Component Value Date    INR 0.9 10/17/2014   INR 1.1 01/19/2014   INR 0.9 12/06/2013    Radiology No results found.   Assessment/Plan 1. Atherosclerosis of native artery of both lower extremities  with intermittent claudication (HCC)  Recommend:  The patient has evidence of severe atherosclerotic changes of both lower extremities associated with ulceration and tissue loss of the foot.  This represents a limb threatening ischemia and places the patient at the risk for limb loss.  Patient should undergo angiography of the lower extremities with the hope for intervention for limb salvage.  Since she has had a complete diagnostic and has a very unique arterial reconstruction I will plan bilateral antegrade approaches.  This will allow for treatment of the below knee disease bilaterally.   The risks and benefits as well as the alternative therapies was discussed in detail with the patient.  All questions were answered.  Patient agrees to proceed with angiography.  The patient will follow up with me in the office after the procedure.    2. Bilateral carotid artery stenosis Recommend:  Given the patient's asymptomatic subcritical stenosis no further invasive testing or surgery at this time.  Continue antiplatelet therapy as prescribed Continue management of CAD, HTN and Hyperlipidemia Healthy heart diet,  encouraged exercise at least 4 times per week Follow up in 6 months with duplex ultrasound and physical exam   3. Essential hypertension Continue antihypertensive medications as already ordered, these medications have been reviewed and there are no changes at this time.   4. Diabetic peripheral neuropathy associated with type 2 diabetes mellitus ([place]) Continue hypoglycemic medications as already ordered, these medications have been reviewed and there are no changes at this time.  Hgb A1C to be monitored as already arranged by primary service   Hortencia Pilar, MD  05/05/2018 7:26 AM

## 2018-05-05 NOTE — Anesthesia Procedure Notes (Signed)
Procedure Name: LMA Insertion Performed by: Alexy Heldt, CRNA Pre-anesthesia Checklist: Patient identified, Patient being monitored, Timeout performed, Emergency Drugs available and Suction available Patient Re-evaluated:Patient Re-evaluated prior to induction Oxygen Delivery Method: Circle system utilized Preoxygenation: Pre-oxygenation with 100% oxygen Induction Type: IV induction Ventilation: Mask ventilation without difficulty LMA: LMA inserted LMA Size: 3.5 Tube type: Oral Number of attempts: 1 Placement Confirmation: positive ETCO2 and breath sounds checked- equal and bilateral Tube secured with: Tape Dental Injury: Teeth and Oropharynx as per pre-operative assessment        

## 2018-05-05 NOTE — Transfer of Care (Signed)
Immediate Anesthesia Transfer of Care Note  Patient: Laura Reed  Procedure(s) Performed: LOWER EXTREMITY ANGIOGRAPHY (Left )  Patient Location: PACU  Anesthesia Type:General  Level of Consciousness: awake and responds to stimulation  Airway & Oxygen Therapy: Patient Spontanous Breathing and Patient connected to nasal cannula oxygen  Post-op Assessment: Report given to RN and Post -op Vital signs reviewed and stable  Post vital signs: Reviewed and stable  Last Vitals:  Vitals Value Taken Time  BP 94/62 05/05/2018  9:12 AM  Temp    Pulse 61 05/05/2018  9:12 AM  Resp 14 05/05/2018  9:12 AM  SpO2 95 % 05/05/2018  9:12 AM    Last Pain:  Vitals:   05/05/18 0640  TempSrc: Oral  PainSc: 0-No pain         Complications: No apparent anesthesia complications

## 2018-05-05 NOTE — Op Note (Signed)
[facility] Percutaneous Study/Intervention Procedural Note   Date of Surgery: 05/05/2018  Surgeon:  Katha Cabal, MD.  Pre-operative Diagnosis: Atherosclerotic occlusive disease with lifestyle limiting claudication bilateral lower extremities  Post-operative diagnosis: Same  Procedure(s) Performed: 1. Introduction catheter into right and left lower extremity 3rd order catheter placement  2. Contrast injection bilateral lower extremity for distal runoff   3. Percutaneous transluminal angioplasty and stent placement left  popliteal 4. Percutaneous transluminal angioplasty of the right anterior tibial artery to 3 mm              5.  Minx closure bilateral bypass arteriotomies  Anesthesia: Conscious sedation was administered under my direct supervision by the interventional radiology RN. IV Versed plus fentanyl were utilized. Continuous ECG, pulse oximetry and blood pressure was monitored throughout the entire procedure.  Conscious sedation was for a total of 84 minutes.  Sheath: 5 French sheath antegrade left femoral to above-knee bypass                5 French sheath antegrade right femoral popliteal bypass  Contrast: 45 cc  Fluoroscopy Time: 4.9 minutes  Indications: Laura Reed presents with worsening pain in her lower extremities particularly with ambulation.  Noninvasive studies identified iliac stenosis and this was recently treated.  At that time distal runoff was obtained which demonstrated greater than 90% stenosis in the mid popliteal on the left and severe tibial stenosis on the right.  Patient is status post bilateral femoral-popliteal bypasses on the left it is a above-knee with Gore-Tex and on the right it is a below the knee with saphenous vein.  Given her iliac occlusive disease and her femoral femoral bypass I have elected to perform antegrade punctures to both the right and to the  left lower extremities.  The risks and benefits are reviewed all questions answered patient agrees to proceed.  Procedure: Laura Reed is a 57 y.o. y.o. female who was identified and appropriate procedural time out was performed. The patient was then placed supine on the table and prepped and draped in the usual sterile fashion.   Ultrasound was placed in the sterile sleeve and the left groin was evaluated and the prosthetic bypass identified.  A segment approximately 3 to 4 cm distal to the anastomosis was selected.  The graft was echolucent with color flow present indicating patency.  Images recorded for the permanent record.  Micropuncture needle was then inserted into the bypass graft under direct ultrasound visualization.  Microwire followed by micro-sheath J-wire followed by a 5 French 11 cm Pinnacle sheath was placed.  The Distal runoff was then performed by hand-injection.  This demonstrated widely patent prosthetic bypass free of any extrinsic compression or kinks.  Distal anastomosis is widely patent.  The mid popliteal at the level of the tibioperoneal plateau demonstrates a 90% stenosis associated with high-grade stenosis and densely calcific plaque over several centimeters.  The anterior tibial is patent down to the foot and fills the pedal arch.  The peroneal is patent although somewhat small.  There is moderate disease in the tibioperoneal trunk.  5000 units of heparin was then given and allowed to circulate and a Magic torque wire was advanced down into the peroneal under direct visualization.  A 4 mm Lutonix drug-eluting balloon was then used to angioplasty the popliteal.  Inflation was to 10 atm 1 minute.  Follow-up imaging demonstrated greater than 60% residual stenosis and a 6 mm x 60 mm life stent  was then advanced over the wire and deployed across the stenosis.  It was postdilated with a 5 mm x 60 mm Lutonix drug-eluting balloon.  Follow-up imaging now demonstrates approximately  10 to 15% residual stenosis but it is smooth and compressed to the side stent is widely patent and there is preservation of the two-vessel runoff and filling of the pedal arch.  Having successfully treated the left I now turned my attention to the right lower extremity.  Ultrasound was placed in the sterile sleeve and the right groin was evaluated and the saphenous vein bypass identified.  A segment approximately 3 to 4 cm distal to the anastomosis was selected.  The vein bypass was echolucent and pulsatile with color flow present indicating patency.  Images recorded for the permanent record.  Micropuncture needle was then inserted into the bypass graft under direct ultrasound visualization.  Microwire followed by micro-sheath J-wire followed by a 5 French 11 cm Pinnacle sheath was placed.  The Distal runoff was then performed by hand-injection.  This demonstrated widely patent vein bypass free of any extrinsic compression or kinks.  Distal anastomosis is widely patent.  The distal popliteal at the level of the below-knee demonstrates a patent distal bypass anastomosis is widely patent there is two-vessel runoff to the foot again via the peroneal and the anterior tibial.  Anterior tibial is dominant.  Anterior tibial has a 70 to 80% stenosis at several locations in its proximal at 10 cm.  Distal to this anterior tibial is approximately 3 mm and widely patent filling the pedal arch.  Peroneal is significantly smaller and contributes somewhat to the foot via collaterals at the level of the ankle.    014 [place] core wire was advanced down into the anterior tibial under direct visualization.  A 3 mm by 150 mm ultra score balloon was then used to angioplasty the proximal one third of the anterior tibial.  Inflation was to 10 atm 1 minute.  Follow-up imaging demonstrated wide patency with less than 5% residual stenosis throughout the entire length of the anterior tibial there is preservation of the two-vessel  runoff and filling of the pedal arch.  After review of these images the sheath is pulled into the proximal femoral bypasses and oblique view is obtained and a minx device deployed first on the left than on the right. There no immediate complications.   Findings:  On the left initial views demonstrate the 90% stenosis in the popliteal with a two-vessel runoff as noted above.  Following angioplasty there is significant residual stenosis and therefore stent is placed as described above it is postdilated and now demonstrates less than 10% residual stenosis.  On the right the lower extremity demonstrates patent bypass with tibial disease.  Anterior tibial is selected the greater than 70% stenosis is treated with a 3 mm balloon inflation.  Follow-up imaging demonstrates less than 5% residual stenosis    Summary: Successful recanalization bilateral lower extremity for limb salvage    Disposition: Patient was taken to the recovery room in stable condition having tolerated the procedure well.  Nicholas Ossa, Dolores Lory 05/05/2018,1:07 PM

## 2018-05-07 ENCOUNTER — Telehealth: Payer: Self-pay

## 2018-05-07 NOTE — Telephone Encounter (Signed)
Patient states she is dong much better since discharge.  Plans to follow up with routine appointments as directed. Declined follow up transitional care managment appointment with pcp at this time.

## 2018-05-10 ENCOUNTER — Telehealth (INDEPENDENT_AMBULATORY_CARE_PROVIDER_SITE_OTHER): Payer: Self-pay

## 2018-05-10 NOTE — Telephone Encounter (Signed)
Patient called and stated that her leg has bruised from her upper inner thigh down to her knee from where she had the procedure done on 10/30. She wants to know if this os normal? She states that she is not in any pain and that the leg is not numb.  After speaking to Dr. Delana Meyer, he stated that the area where the procedure was done at is hard to apply pressure and stick at the same time and that the bruising that the patient is seeing is normal and that she should elevate and that the bruising should subside soon.

## 2018-05-12 ENCOUNTER — Encounter: Admission: RE | Payer: Self-pay | Source: Ambulatory Visit

## 2018-05-12 SURGERY — LOWER EXTREMITY ANGIOGRAPHY
Anesthesia: General | Laterality: Right

## 2018-05-17 DIAGNOSIS — L97521 Non-pressure chronic ulcer of other part of left foot limited to breakdown of skin: Secondary | ICD-10-CM | POA: Diagnosis not present

## 2018-05-17 DIAGNOSIS — L97511 Non-pressure chronic ulcer of other part of right foot limited to breakdown of skin: Secondary | ICD-10-CM | POA: Diagnosis not present

## 2018-05-21 ENCOUNTER — Ambulatory Visit (INDEPENDENT_AMBULATORY_CARE_PROVIDER_SITE_OTHER): Payer: Medicare Other

## 2018-05-21 VITALS — BP 132/62 | HR 74 | Temp 98.3°F | Resp 15 | Ht 66.0 in | Wt 162.0 lb

## 2018-05-21 DIAGNOSIS — Z0000 Encounter for general adult medical examination without abnormal findings: Secondary | ICD-10-CM | POA: Diagnosis not present

## 2018-05-21 NOTE — Progress Notes (Signed)
No concerns, follow up with PCP

## 2018-05-21 NOTE — Patient Instructions (Addendum)
  Laura Reed , Thank you for taking time to come for your Medicare Wellness Visit. I appreciate your ongoing commitment to your health goals. Please review the following plan we discussed and let me know if I can assist you in the future.   These are the goals we discussed: Goals    . DIET - REDUCE SUGAR INTAKE     A1C within range       This is a list of the screening recommended for you and due dates:  Health Maintenance  Topic Date Due  . Complete foot exam   07/30/2016  . Eye exam for diabetics  11/20/2016  . Pap Smear  07/30/2018  . Hemoglobin A1C  09/23/2018  . Mammogram  09/26/2019  . Tetanus Vaccine  03/16/2022  . Colon Cancer Screening  04/27/2022  . Flu Shot  Completed  . Pneumococcal vaccine  Completed  .  Hepatitis C: One time screening is recommended by Center for Disease Control  (CDC) for  adults born from [birth year] through 1967.   Completed  . HIV Screening  Completed

## 2018-05-21 NOTE — Progress Notes (Signed)
Subjective:   Laura Reed is a 57 y.o. female who presents for Medicare Annual (Subsequent) preventive examination.  Review of Systems:  No ROS.  Medicare Wellness Visit. Additional risk factors are reflected in the social history. Cardiac Risk Factors include: hypertension;diabetes mellitus     Objective:     Vitals: BP 132/62 (BP Location: Left Arm, Patient Position: Sitting, Cuff Size: Normal)   Pulse 74   Temp 98.3 F (36.8 C) (Oral)   Resp 15   Ht 5\' 6"  (1.676 m)   Wt 162 lb (73.5 kg)   SpO2 96%   BMI 26.15 kg/m   Body mass index is 26.15 kg/m.  Advanced Directives 05/21/2018 05/05/2018 02/02/2018 01/18/2018 03/27/2017 12/10/2016 12/04/2016  Does Patient Have a Medical Advance Directive? Yes Yes Yes Yes Yes Yes Yes  Type of Paramedic of [place];Living will [place];Living will [place];Living will [place];Living will [place];Living will - [place];Living will  Does patient want to make changes to medical advance directive? No - Patient declined No - Patient declined No - Patient declined No - Patient declined No - Patient declined - -  Copy of [place] in Chart? No - copy requested No - copy requested No - copy requested No - copy requested No - copy requested - -  Would patient like information on creating a medical advance directive? - - - No - Patient declined - - -    Tobacco Social History   Tobacco Use  Smoking Status Former Smoker  . Packs/day: 2.00  . Years: 35.00  . Pack years: 70.00  . Types: Cigarettes  . Last attempt to quit: 03/30/2011  . Years since quitting: 7.1  Smokeless Tobacco Never Used     Counseling given: Not Answered   Clinical Intake:  Pre-visit preparation completed: Yes  Pain : No/denies pain     Nutritional Status: BMI 25 -29 Overweight Diabetes: Yes(Followed by pcp)  How often  do you need to have someone help you when you read instructions, pamphlets, or other written materials from your doctor or pharmacy?: 1 - Never  Interpreter Needed?: No     Past Medical History:  Diagnosis Date  . Anxiety   . Bladder cancer ([place])   . CHF (congestive heart failure) ([place])   . Coronary artery disease   . Diabetes mellitus   . Heart murmur   . Hemorrhoid   . Hypertension   . Neuropathy   . PVD (peripheral vascular disease) ([place])   . Urothelial carcinoma of kidney ([place]) 10/31/2014   INVASIVE UROTHELIAL CARCINOMA, LOW GRADE. T1, Nx.   Past Surgical History:  Procedure Laterality Date  . AMPUTATION TOE    . ARTERIAL BYPASS SURGRY  2009, 2013 x 2   right leg , done in [place]  . CARDIAC CATHETERIZATION    . CAROTID ENDARTERECTOMY Right 01/2014   Dr Delana Meyer  . CATARACT EXTRACTION W/PHACO Right 12/14/2014   Procedure: CATARACT EXTRACTION PHACO AND INTRAOCULAR LENS PLACEMENT (IOC);  Surgeon: Lyla Glassing, MD;  Location: [facility] ORS;  Service: Ophthalmology;  Laterality: Right;  [place]   00:38.6              AP        7.1                   CDE  2.76  . CESAREAN SECTION    . CHOLECYSTECTOMY  03-03-12   Porcelain gallbladder, gallstones,  [place]  . COLONOSCOPY W/ BIOPSIES  04/28/2012   Hyperplastic rectal polyps.  . CORONARY ARTERY BYPASS GRAFT  2009   3 vessel  . CYSTOSCOPY W/ RETROGRADES Right 09/01/2016   Procedure: CYSTOSCOPY WITH RETROGRADE PYELOGRAM;  Surgeon: Hollice Espy, MD;  Location: [facility] OR[facility];  Service: Urology;  Laterality: Right;  . HERNIA REPAIR  10-31-14   ventral, retro-rectus atrium mesh  . LOWER EXTREMITY ANGIOGRAPHY Left 12/10/2016   Procedure: Lower Extremity Angiography;  Surgeon: Katha Cabal, MD;  Location: [place] [facility];  Service: Cardiovascular;  Laterality: Left;  . LOWER EXTREMITY ANGIOGRAPHY Left 02/02/2018   Procedure: LOWER EXTREMITY ANGIOGRAPHY;  Surgeon: Katha Cabal, MD;  Location: [facility];  Service:  Cardiovascular;  Laterality: Left;  . LOWER EXTREMITY ANGIOGRAPHY Left 05/05/2018   Procedure: LOWER EXTREMITY ANGIOGRAPHY;  Surgeon: Katha Cabal, MD;  Location: [place] [facility];  Service: Cardiovascular;  Laterality: Left;  . NEPHRECTOMY Left 10-31-14  . PERIPHERAL VASCULAR CATHETERIZATION Left 05/01/2015   Procedure: Lower Extremity Angiography;  Surgeon: Katha Cabal, MD;  Location: [facility];  Service: Cardiovascular;  Laterality: Left;  . PERIPHERAL VASCULAR CATHETERIZATION  05/01/2015   Procedure: Lower Extremity Intervention;  Surgeon: Katha Cabal, MD;  Location: [place] [facility];  Service: Cardiovascular;;  . PERIPHERAL VASCULAR CATHETERIZATION Left 02/20/2015   Procedure: Pelvic Angiography;  Surgeon: Katha Cabal, MD;  Location: [facility];  Service: Cardiovascular;  Laterality: Left;  . TRANSURETHRAL RESECTION OF BLADDER TUMOR WITH MITOMYCIN-C N/A 09/01/2016   Procedure: TRANSURETHRAL RESECTION OF BLADDER TUMOR WITH MITOMYCIN-C;  Surgeon: Hollice Espy, MD;  Location: [facility];  Service: Urology;  Laterality: N/A;   Family History  Problem Relation Age of Onset  . Cancer Mother 43       Lung Cancer  . Cancer Father 35       Lung Ca  . Diabetes Son   . Breast cancer Maternal Grandmother   . Kidney cancer Neg Hx   . Bladder Cancer Neg Hx   . Prostate cancer Neg Hx    Social History   Socioeconomic History  . Marital status: Single    Spouse name: Not on file  . Number of children: Not on file  . Years of education: Not on file  . Highest education level: Not on file  Occupational History  . Not on file  Social Needs  . Financial resource strain: Not hard at all  . Food insecurity:    Worry: Never true    Inability: Never true  . Transportation needs:    Medical: No    Non-medical: No  Tobacco Use  . Smoking status: Former Smoker    Packs/day: 2.00    Years: 35.00    Pack years: 70.00    Types: Cigarettes     Last attempt to quit: 03/30/2011    Years since quitting: 7.1  . Smokeless tobacco: Never Used  Substance and Sexual Activity  . Alcohol use: No    Alcohol/week: 0.0 standard drinks  . Drug use: No  . Sexual activity: Not Currently  Lifestyle  . Physical activity:    Days per week: 0 days    Minutes per session: Not on file  . Stress: Not at all  Relationships  . Social connections:    Talks on phone: Not on file    Gets together: Not on file    Attends religious service: Not  on file    Active member of club or organization: Not on file    Attends meetings of clubs or organizations: Not on file    Relationship status: Not on file  Other Topics Concern  . Not on file  Social History Narrative  . Not on file    Outpatient Encounter Medications as of 05/21/2018  Medication Sig  . ACCU-CHEK AVIVA PLUS test strip TEST BLOOD SUGAR 2 TO 3 TIMES DAILY  . ACCU-CHEK AVIVA PLUS test strip TEST BLOOD SUGAR 2 TO 3 TIMES DAILY  . acetaminophen (TYLENOL) 500 MG tablet Take 1,000 mg by mouth every 6 (six) hours as needed for mild pain or headache.   . ALPRAZolam (XANAX) 0.5 MG tablet Take 1 tablet (0.5 mg total) by mouth daily as needed. for anxiety  . amLODipine (NORVASC) 5 MG tablet TAKE 1 TABLET(5 MG) BY MOUTH DAILY  . aspirin 81 MG chewable tablet Chew 81 mg by mouth daily.  Marland Kitchen atorvastatin (LIPITOR) 20 MG tablet TAKE 1 TABLET BY MOUTH EVERY DAY  . B-D ULTRAFINE III SHORT PEN 31G X 8 MM MISC TEST BLOOD SUGAR TWICE DAILY  . B-D ULTRAFINE III SHORT PEN 31G X 8 MM MISC TEST BLOOD SUGAR TWICE DAILY  . Cholecalciferol (VITAMIN D3) 2000 UNITS TABS Take 2,000 Units by mouth daily after supper.   . clopidogrel (PLAVIX) 75 MG tablet TAKE 1 TABLET BY MOUTH EVERY DAY  . DIGOX 125 MCG tablet TAKE 1 TABLET BY MOUTH EVERY DAY  . diphenhydrAMINE (BENADRYL) 25 mg capsule Take 25 mg by mouth at bedtime as needed for sleep.  Marland Kitchen gabapentin (NEURONTIN) 600 MG tablet TAKE 1 TABLET BY MOUTH THREE TIMES DAILY    . HUMALOG MIX 75/25 KWIKPEN (75-25) 100 UNIT/ML Kwikpen INJECT 30 UNITS UNDER THE SKIN EVERY MORNING AND 36 UNITS EVERY EVENING  . Lactulose 20 GM/30ML SOLN 30 ml every 4 hours until constipation is relieved  . Lancets Misc. MISC Use to check blood sugars 3 times daily  . LEVEMIR FLEXTOUCH 100 UNIT/ML Pen INJECT 40 UNITS UNDER THE SKIN EVERY DAY AT 10 PM  . lisinopril (PRINIVIL,ZESTRIL) 5 MG tablet Take 1 tablet (5 mg total) by mouth daily.  Marland Kitchen neomycin-bacitracin-polymyxin (NEOSPORIN) ointment Apply 1 application topically as needed for wound care.  . TRUEPLUS LANCETS 28G MISC CHECK BLOOD SUGAR FOUR TIMES DAILY  . TRUEPLUS LANCETS 28G MISC CHECK BLOOD SUGAR FOUR TIMES DAILY   No facility-administered encounter medications on file as of 05/21/2018.     Activities of Daily Living In your present state of health, do you have any difficulty performing the following activities: 05/21/2018 05/05/2018  Hearing? N -  Vision? Y Y  Comment Notes she has scheduled annual eye exam -  Difficulty concentrating or making decisions? N N  Walking or climbing stairs? Y Y  Comment - -  Dressing or bathing? N N  Doing errands, shopping? N -  Preparing Food and eating ? N -  Using the Toilet? N -  In the past six months, have you accidently leaked urine? Y -  Comment When coughing, sneezing, laughing -  Do you have problems with loss of bowel control? N -  Managing your Medications? N -  Managing your Finances? N -  Housekeeping or managing your Housekeeping? N -  Some recent data might be hidden    Patient Care Team: [name], MD as PCP - General (Internal Medicine) Bary Castilla, Forest Gleason, MD (General Surgery) [name], MD (Internal  Medicine)    Assessment:   This is a routine wellness examination for [place]. The goal of the wellness visit is to assist the patient how to close the gaps in care and create a preventative care plan for the patient.   The roster of all physicians  providing medical care to patient is listed in the Snapshot section of the chart.  Taking calcium VIT D as appropriate/Osteoporosis risk reviewed.     Diabetes- followed by pcp.  FBS range 160-170.  Monitors daily.   Safety issues reviewed; Lives with daughter. Smoke and carbon monoxide detectors in the home. No firearms in the home. Wears seatbelts when driving or riding with others. No violence in the home.  They do not have excessive sun exposure.  Discussed the need for sun protection: hats, long sleeves and the use of sunscreen if there is significant sun exposure.  Patient is alert, normal appearance, oriented to person/place/and time. Correctly identified the president of the [place] and recalls of 3/3 words. Performs simple calculations and can read correct time from watch face.  Displays appropriate judgement.  No failures at ADL's or IADL's.  Ambulates with open toe medical walking shoe.   BMI- discussed the importance of a healthy diet, water intake and the benefits of aerobic exercise. Educational material provided.   24 hour diet recall: Regular diet  Dental- dentures.   Sleep patterns- Sleeps about 4 hours at night.  Naps during the day as needed.   Patient Concerns: None at this time. Follow up with PCP as needed.  Exercise Activities and Dietary recommendations Current Exercise Habits: The patient does not participate in regular exercise at present  Goals    . DIET - REDUCE SUGAR INTAKE     A1C within range       Fall Risk Fall Risk  05/21/2018 03/27/2017 04/09/2016 11/14/2015  Falls in the past year? 1 No No Yes  Number falls in past yr: 0 - - 1  Comment She missed a step. - - -  Injury with Fall? 0 - - No  Comment Fall 3 months ago. - - -  Risk for fall due to : - - - Other (Comment)  Risk for fall due to: Comment - - - toe amputations x 4  Follow up - - - Education provided;Falls prevention discussed   Depression Screen PHQ 2/9 Scores 05/21/2018  03/27/2017 04/09/2016 11/14/2015  PHQ - 2 Score 0 0 0 0  PHQ- 9 Score - 0 - -     Cognitive Function MMSE - Mini Mental State Exam 05/21/2018 03/27/2017  Orientation to time 5 5  Orientation to Place 5 5  Registration 3 3  Attention/ Calculation 5 5  Recall 3 3  Language- name 2 objects 2 2  Language- repeat 1 1  Language- follow 3 step command 3 3  Language- read & follow direction 1 1  Write a sentence 1 1  Copy design 1 1  Total score 30 30        Immunization History  Administered Date(s) Administered  . Influenza Split 03/16/2012  . Influenza,inj,Quad PF,6+ Mos 04/06/2013, 03/14/2014, 03/27/2017, 03/25/2018  . Influenza-Unspecified 04/02/2015, 03/14/2016  . Pneumococcal Conjugate-13 06/15/2014  . Pneumococcal Polysaccharide-23 07/07/2010  . Tdap 03/16/2012   Screening Tests Health Maintenance  Topic Date Due  . FOOT EXAM  07/30/2016  . OPHTHALMOLOGY EXAM  11/20/2016  . PAP SMEAR  07/30/2018  . HEMOGLOBIN A1C  09/23/2018  . MAMMOGRAM  09/26/2019  . TETANUS/TDAP  03/16/2022  . COLONOSCOPY  04/27/2022  . INFLUENZA VACCINE  Completed  . PNEUMOCOCCAL POLYSACCHARIDE VACCINE AGE 24-64 HIGH RISK  Completed  . Hepatitis C Screening  Completed  . HIV Screening  Completed      Plan:    End of life planning; Advance aging; Advanced directives discussed. Copy of current HCPOA/Living Will requested.    I have personally reviewed and noted the following in the patient's chart:   . Medical and social history . Use of alcohol, tobacco or illicit drugs  . Current medications and supplements . Functional ability and status . Nutritional status . Physical activity . Advanced directives . List of other physicians . Hospitalizations, surgeries, and ER visits in previous 12 months . Vitals . Screenings to include cognitive, depression, and falls . Referrals and appointments  In addition, I have reviewed and discussed with patient certain preventive protocols, quality  metrics, and best practice recommendations. A written personalized care plan for preventive services as well as general preventive health recommendations were provided to patient.     Varney Biles, LPN  [id number]

## 2018-05-25 ENCOUNTER — Other Ambulatory Visit: Payer: Self-pay | Admitting: Pharmacist

## 2018-05-25 NOTE — Patient Outreach (Addendum)
[facility]) Care Management  [place]   05/25/2018  Laura Reed 1960/07/23 [id number]  57 year old female referred to [facility] for medication review due to High Risk review of [employer]. PMHx includes, but not limited to, T2DM, HTN, peripheral neuropathy, peripheral artery disease, renal cancer s/p nephrectomy.    Patient was contacted today to review medications due to multiple [place] codes (DM, HTN) and elevated RAF score. HIPAA verifiers identified.   She notes that over the past few months, she has struggled with sores on both feet and she has had to remain more immobile, which has resulted in reduced exercise and more "sitting around and snacking". This has translated into increase in her A1c. She notes that she is recovering well from her angiogram procedure last month and denies any pain or discomfort. She endorses some occasional shooting pains in her feet, but overall feels the gabapentin is helping to minimize peripheral neuropathy. Denies significant sedation from current dosing.   She notes that she checks BG 2-3 times daily. She is dosing Levemir once daily in the evenings and Humalog Mix 75/25 twice daily with breakfast and supper. She endorses higher fasting BG first thing in the morning and better controlled sugars throughout the day, and relates this to often eating a snack around 10 pm (typically single serving popcorn, 1/2 apple and peanut butter, or a few graham crackers with peanut butter). She denies any episodes of hypoglycemia.   Patient Reported SMBG:  Fasting : 150-170s (occasionally +200 if she ate a larger evening snack) Pre lunch: 130-135 Pre supper: 100-120  Objective: Lab Results  Component Value Date   CREATININE 0.99 04/27/2018   CREATININE 1.07 03/25/2018   CREATININE 1.16 (H) 01/18/2018  eGFR 55 mL/min/1.60m  Lab Results  Component Value Date   HGBA1C 8.7 (H) 03/25/2018    Lipid  Panel     Component Value Date/Time   CHOL 111 03/25/2018 0915   TRIG 113.0 03/25/2018 0915   HDL 36.90 (L) 03/25/2018 0915   CHOLHDL 3 03/25/2018 0915   VLDL 22.6 03/25/2018 0915   LDLCALC 51 03/25/2018 0915   LDLDIRECT 47.0 01/28/2017 1509    BP Readings from Last 3 Encounters:  05/21/18 132/62  05/05/18 (!) 129/46  04/27/18 (!) 159/61    No Known Allergies  Medications Reviewed Today    Reviewed by TDe [place] [facility](Pharmacist) on 05/25/18 at 1423  Med List Status: <None>  Medication Order Taking? Sig Documenting Provider Last Dose Status Informant  ACCU-CHEK AVIVA PLUS test strip [id number] TEST BLOOD SUGAR 2 TO 3 TIMES DAILY TCrecencio Mc MD Taking Active Multiple Informants  acetaminophen (TYLENOL) 500 MG tablet [id number] Take 1,000 mg by mouth every 6 (six) hours as needed for mild pain or headache.  [provider] Not Taking Active Multiple Informants  ALPRAZolam (XANAX) 0.5 MG tablet [id number] Take 1 tablet (0.5 mg total) by mouth daily as needed. for anxiety TCrecencio Mc MD Taking Active   amLODipine (NORVASC) 5 MG tablet [id number] TAKE 1 TABLET(5 MG) BY MOUTH DAILY TCrecencio Mc MD Taking Active            Med Note (TJacksonNov 19, 2019  2:15 PM) HS  aspirin 81 MG chewable tablet [id number] Chew 81 mg by mouth daily. [provider] Taking Active Multiple Informants  atorvastatin (LIPITOR) 20 MG tablet [id number] TAKE 1 TABLET  BY MOUTH EVERY DAY [name], MD Taking Active   B-D ULTRAFINE III SHORT PEN 31G X 8 MM MISC [id number] Yes TEST BLOOD SUGAR TWICE DAILY [name], MD Taking Active Multiple Informants  Cholecalciferol (VITAMIN D3) 2000 UNITS TABS [id number] Yes Take 2,000 Units by mouth daily after supper.  [provider] Taking Active Multiple Informants  clopidogrel (PLAVIX) 75 MG tablet [id number] Yes TAKE 1 TABLET BY MOUTH EVERY DAY [name], MD Taking Active  Multiple Informants  [place] 125 MCG tablet [id number] Yes TAKE 1 TABLET BY MOUTH EVERY DAY [name], MD Taking Active   diphenhydrAMINE (BENADRYL) 25 mg capsule [id number] No Take 25 mg by mouth at bedtime as needed for sleep. [provider] Not Taking Active Multiple Informants  gabapentin (NEURONTIN) 600 MG tablet [id number] Yes TAKE 1 TABLET BY MOUTH THREE TIMES DAILY [name], MD Taking Active Multiple Informants  HUMALOG MIX 75/25 KWIKPEN (75-25) 100 UNIT/ML Claiborne Rigg [id number] Yes INJECT 30 UNITS UNDER THE SKIN EVERY MORNING AND 36 UNITS EVERY EVENING [name], MD Taking Active   Lactulose 20 GM/30ML SOLN [id number] No 30 ml every 4 hours until constipation is relieved  Patient not taking:  Reported on 05/25/2018   [name], MD Not Taking Active Multiple Informants  Lancets Misc. [place] [id number] Yes Use to check blood sugars 3 times daily [name], MD Taking Active Multiple Informants  LEVEMIR FLEXTOUCH 100 UNIT/ML Pen [id number] Yes INJECT 40 UNITS UNDER THE SKIN EVERY DAY AT 10 PM [name], MD Taking Active   lisinopril (PRINIVIL,ZESTRIL) 5 MG tablet [id number] Yes Take 1 tablet (5 mg total) by mouth daily. [name], MD Taking Active            Med Note ([place] May 25, 2018  2:23 PM) AM  neomycin-bacitracin-polymyxin (NEOSPORIN) ointment [id number] Yes Apply 1 application topically as needed for wound care. [provider] Taking Active Multiple Informants  TRUEPLUS LANCETS 28G [place] [id number] Yes CHECK BLOOD SUGAR FOUR TIMES DAILY [name], MD Taking Active Multiple Informants          Assessment:  Drugs sorted by system:  Neurologic/Psychologic: alprazolam  Cardiovascular: amlodipine, aspirin, atorvastatin, clopidogrel, digoxin, lisinopril  Endocrine: Levemir, Humalog Mix  Topical: neosporin  Pain: acetaminophen, gabapentin  Vitamins/Minerals/Supplements: vitamin D  Medication Review  Findings:  . Diabetes: most recent A1c from 2 months ago is not at goal. Further titration of therapy is recommended. Consider ordering BMP to recheck renal function - if eGFR is still >30, could consider initiation of metformin to improve BG control. Additionally, patient is currently paying 2 copays for Levemir and Humalog 75/25 mix and injecting 3 times daily - could consider transitioning to a basal insulin that provides true 24 hours coverage, instead of Levemir, and start Humalog by itself with her 2 meals. I am concerned about the potential for hypoglycemia with her dosing Levemir in the evening and it peaking overnight, especially if the dose is increased to better target fasting blood sugars. I would be happy to see this patient in clinic for diabetes management . Medication Adherence: Per medication dispense history, patient is adherence to all chronic medications (atorvastatin and lisinopril)   Plan: - Route note to PCP Dr. Derrel Nip for notification - Patient has my contact information and may reach out in the future with questions or concerns  [name], PharmD PGY2 [place] Resident, [facility] Phone:  [phone]

## 2018-05-27 ENCOUNTER — Other Ambulatory Visit (INDEPENDENT_AMBULATORY_CARE_PROVIDER_SITE_OTHER): Payer: Self-pay | Admitting: Vascular Surgery

## 2018-05-27 DIAGNOSIS — I739 Peripheral vascular disease, unspecified: Secondary | ICD-10-CM

## 2018-05-31 ENCOUNTER — Ambulatory Visit (INDEPENDENT_AMBULATORY_CARE_PROVIDER_SITE_OTHER): Payer: Medicare Other | Admitting: Vascular Surgery

## 2018-05-31 ENCOUNTER — Encounter (INDEPENDENT_AMBULATORY_CARE_PROVIDER_SITE_OTHER): Payer: Self-pay | Admitting: Vascular Surgery

## 2018-05-31 ENCOUNTER — Ambulatory Visit (INDEPENDENT_AMBULATORY_CARE_PROVIDER_SITE_OTHER): Payer: Medicare Other

## 2018-05-31 VITALS — BP 104/56 | HR 71 | Resp 18 | Ht 66.0 in | Wt 164.0 lb

## 2018-05-31 DIAGNOSIS — Q245 Malformation of coronary vessels: Secondary | ICD-10-CM

## 2018-05-31 DIAGNOSIS — I10 Essential (primary) hypertension: Secondary | ICD-10-CM | POA: Diagnosis not present

## 2018-05-31 DIAGNOSIS — I739 Peripheral vascular disease, unspecified: Secondary | ICD-10-CM

## 2018-05-31 DIAGNOSIS — Z87891 Personal history of nicotine dependence: Secondary | ICD-10-CM

## 2018-05-31 DIAGNOSIS — E1151 Type 2 diabetes mellitus with diabetic peripheral angiopathy without gangrene: Secondary | ICD-10-CM | POA: Diagnosis not present

## 2018-05-31 DIAGNOSIS — I6523 Occlusion and stenosis of bilateral carotid arteries: Secondary | ICD-10-CM | POA: Diagnosis not present

## 2018-06-01 ENCOUNTER — Encounter (INDEPENDENT_AMBULATORY_CARE_PROVIDER_SITE_OTHER): Payer: Self-pay | Admitting: Vascular Surgery

## 2018-06-01 NOTE — Progress Notes (Signed)
MRN : [id number]  Laura Reed is a 57 y.o. (08-23-1960) female who presents with chief complaint of  Chief Complaint  Patient presents with  . Follow-up    2 week [facility] ABI f/u  .  History of Present Illness:   The patient returns to the office for followup and review status post angiogram with intervention.   She is s/p angiogram on 05/05/2018: 1.  Percutaneous transluminal angioplasty and stent placement left  popliteal 2. Percutaneous transluminal angioplasty of the right anterior tibial artery to 3 mm   The patient notes improvement in the lower extremity symptoms. No interval shortening of the patient's claudication distance or rest pain symptoms. Previous wounds have now healed.  No new ulcers or wounds have occurred since the last visit.  There have been no significant changes to the patient's overall health care.  The patient denies amaurosis fugax or recent TIA symptoms. There are no recent neurological changes noted. The patient denies history of DVT, PE or superficial thrombophlebitis. The patient denies recent episodes of angina or shortness of breath.   ABI's Rt= 0.73 and Lt= 0.72 (previous ABI's Rt= 0.75 and Lt= 0.78)   No outpatient medications have been marked as taking for the 05/31/18 encounter (Office Visit) with Delana Meyer, Dolores Lory, MD.    Past Medical History:  Diagnosis Date  . Anxiety   . Bladder cancer ([place])   . CHF (congestive heart failure) ([place])   . Coronary artery disease   . Diabetes mellitus   . Heart murmur   . Hemorrhoid   . Hypertension   . Neuropathy   . PVD (peripheral vascular disease) ([place])   . Urothelial carcinoma of kidney ([place]) 10/31/2014   INVASIVE UROTHELIAL CARCINOMA, LOW GRADE. T1, Nx.    Past Surgical History:  Procedure Laterality Date  . AMPUTATION TOE    . ARTERIAL BYPASS SURGRY  2009, 2013 x 2   right leg , done in [employer]  . CARDIAC CATHETERIZATION    . CAROTID ENDARTERECTOMY Right 01/2014   Dr Delana Meyer  .  CATARACT EXTRACTION W/PHACO Right 12/14/2014   Procedure: CATARACT EXTRACTION PHACO AND INTRAOCULAR LENS PLACEMENT (IOC);  Surgeon: Lyla Glassing, MD;  Location: [facility] ORS;  Service: Ophthalmology;  Laterality: Right;  [place]   00:38.6              AP        7.1                   CDE  2.76  . CESAREAN SECTION    . CHOLECYSTECTOMY  03-03-12   Porcelain gallbladder, gallstones,  [place]  . COLONOSCOPY W/ BIOPSIES  04/28/2012   Hyperplastic rectal polyps.  . CORONARY ARTERY BYPASS GRAFT  2009   3 vessel  . CYSTOSCOPY W/ RETROGRADES Right 09/01/2016   Procedure: CYSTOSCOPY WITH RETROGRADE PYELOGRAM;  Surgeon: Hollice Espy, MD;  Location: [facility] ORS;  Service: Urology;  Laterality: Right;  . HERNIA REPAIR  10-31-14   ventral, retro-rectus atrium mesh  . LOWER EXTREMITY ANGIOGRAPHY Left 12/10/2016   Procedure: Lower Extremity Angiography;  Surgeon: Katha Cabal, MD;  Location: [place] [facility];  Service: Cardiovascular;  Laterality: Left;  . LOWER EXTREMITY ANGIOGRAPHY Left 02/02/2018   Procedure: LOWER EXTREMITY ANGIOGRAPHY;  Surgeon: Katha Cabal, MD;  Location: [place]  [facility];  Service: Cardiovascular;  Laterality: Left;  . LOWER EXTREMITY ANGIOGRAPHY Left 05/05/2018   Procedure: LOWER EXTREMITY ANGIOGRAPHY;  Surgeon: Katha Cabal, MD;  Location:  [place] [facility];  Service: Cardiovascular;  Laterality: Left;  . NEPHRECTOMY Left 10-31-14  . PERIPHERAL VASCULAR CATHETERIZATION Left 05/01/2015   Procedure: Lower Extremity Angiography;  Surgeon: Katha Cabal, MD;  Location: [place] [facility];  Service: Cardiovascular;  Laterality: Left;  . PERIPHERAL VASCULAR CATHETERIZATION  05/01/2015   Procedure: Lower Extremity Intervention;  Surgeon: Katha Cabal, MD;  Location: [place] [facility];  Service: Cardiovascular;;  . PERIPHERAL VASCULAR CATHETERIZATION Left 02/20/2015   Procedure: Pelvic Angiography;  Surgeon: Katha Cabal, MD;  Location: [place] [facility];   Service: Cardiovascular;  Laterality: Left;  . TRANSURETHRAL RESECTION OF BLADDER TUMOR WITH MITOMYCIN-C N/A 09/01/2016   Procedure: TRANSURETHRAL RESECTION OF BLADDER TUMOR WITH MITOMYCIN-C;  Surgeon: Hollice Espy, MD;  Location: [facility] ORS;  Service: Urology;  Laterality: N/A;    Social History Social History   Tobacco Use  . Smoking status: Former Smoker    Packs/day: 2.00    Years: 35.00    Pack years: 70.00    Types: Cigarettes    Last attempt to quit: 03/30/2011    Years since quitting: 7.1  . Smokeless tobacco: Never Used  Substance Use Topics  . Alcohol use: No    Alcohol/week: 0.0 standard drinks  . Drug use: No    Family History Family History  Problem Relation Age of Onset  . Cancer Mother 39       Lung Cancer  . Cancer Father 40       Lung Ca  . Diabetes Son   . Breast cancer Maternal Grandmother   . Kidney cancer Neg Hx   . Bladder Cancer Neg Hx   . Prostate cancer Neg Hx     No Known Allergies   REVIEW OF SYSTEMS (Negative unless checked)  Constitutional: [] Weight loss  [] Fever  [] Chills Cardiac: [] Chest pain   [] Chest pressure   [] Palpitations   [] Shortness of breath when laying flat   [x] Shortness of breath with exertion. Vascular:  [] Pain in legs with walking   [] Pain in legs at rest  [] History of DVT   [] Phlebitis   [x] Swelling in legs   [] Varicose veins   [] Non-healing ulcers Pulmonary:   [] Uses home oxygen   [] Productive cough   [] Hemoptysis   [] Wheeze  [] COPD   [] Asthma Neurologic:  [] Dizziness   [] Seizures   [] History of stroke   [] History of TIA  [] Aphasia   [] Vissual changes   [] Weakness or numbness in arm   [x] Weakness or numbness in leg Musculoskeletal:   [] Joint swelling   [] Joint pain   [x] Low back pain Hematologic:  [] Easy bruising  [] Easy bleeding   [] Hypercoagulable state   [] Anemic Gastrointestinal:  [] Diarrhea   [] Vomiting  [] Gastroesophageal reflux/heartburn   [] Difficulty swallowing. Genitourinary:  [] Chronic kidney disease    [] Difficult urination  [] Frequent urination   [] Blood in urine Skin:  [] Rashes   [] Ulcers  Psychological:  [] History of anxiety   []  History of major depression.  Physical Examination  Vitals:   05/31/18 1458  BP: (!) 104/56  Pulse: 71  Resp: 18  Weight: 164 lb (74.4 kg)  Height: 5\' 6"  (1.676 m)   Body mass index is 26.47 kg/m. Gen: WD/WN, NAD Head: [place]/AT, No temporalis wasting.  Ear/Nose/Throat: Hearing grossly intact, nares w/o erythema or drainage Eyes: PER, EOMI, sclera nonicteric.  Neck: Supple, no large masses.   Pulmonary:  Good air movement, no audible wheezing bilaterally, no use of accessory muscles.  Cardiac: RRR, no JVD Vascular: 1+  edema bilateral ankles both feet are pink and warm with brisk capillary refill Vessel Right Left  Radial Palpable Palpable  PT  trace palpable  trace palpable  DP  not palpable  2+ palpable  Gastrointestinal: Non-distended. No guarding/no peritoneal signs.  Musculoskeletal: M/S 5/5 throughout.  No deformity or atrophy.  Neurologic: CN 2-12 intact. Symmetrical.  Speech is fluent. Motor exam as listed above. Psychiatric: Judgment intact, Mood & affect appropriate for pt's clinical situation. Dermatologic: No rashes or ulcers noted.  No changes consistent with cellulitis. Lymph : No lichenification or skin changes of chronic lymphedema.  CBC Lab Results  Component Value Date   WBC 10.5 02/18/2016   HGB 12.2 09/01/2016   HCT 36.0 09/01/2016   MCV 87.0 02/18/2016   PLT 266.0 02/18/2016    BMET    Component Value Date/Time   NA 136 03/25/2018 0915   NA 135 11/02/2014 0609   K 4.7 03/25/2018 0915   K 4.2 11/02/2014 0609   CL 101 03/25/2018 0915   CL 107 11/02/2014 0609   CO2 29 03/25/2018 0915   CO2 23 11/02/2014 0609   GLUCOSE 136 (H) 03/25/2018 0915   GLUCOSE 108 (H) 11/02/2014 0609   BUN 28 (H) 04/27/2018 0834   BUN 20 11/02/2014 0609   CREATININE 0.99 04/27/2018 0834   CREATININE 1.01 11/09/2015 1549   CREATININE 1.01  11/09/2015 1549   CALCIUM 9.6 03/25/2018 0915   CALCIUM 7.3 (L) 11/02/2014 0609   GFRNONAA >60 04/27/2018 0834   GFRNONAA 50 (L) 11/02/2014 0609   GFRAA >60 04/27/2018 0834   GFRAA 58 (L) 11/02/2014 0609   CrCl cannot be calculated (Patient's most recent lab result is older than the maximum 21 days allowed.).  COAG Lab Results  Component Value Date   INR 0.9 10/17/2014   INR 1.1 01/19/2014   INR 0.9 12/06/2013    Radiology No results found.    Assessment/Plan 1. PVD (peripheral vascular disease) ([place]) Recommend:  The patient is status post successful angiogram with intervention.  The patient reports that the claudication symptoms and leg pain is essentially gone.   The patient denies lifestyle limiting changes at this point in time.  No further invasive studies, angiography or surgery at this time The patient should continue walking and begin a more formal exercise program.  The patient should continue antiplatelet therapy and aggressive treatment of the lipid abnormalities  Smoking cessation was again discussed  The patient should continue wearing graduated compression socks 10-15 mmHg strength to control the mild edema.  Patient should undergo noninvasive studies as ordered. The patient will follow up with me after the studies.   - VAS [place] LOWER EXTREMITY ARTERIAL DUPLEX; Future - VAS [place] ABI WITH/WO TBI; Future  2. Bilateral carotid artery stenosis Recommend:  Given the patient's asymptomatic subcritical stenosis no further invasive testing or surgery at this time.  Duplex ultrasound shows less than 50% stenosis bilaterally.  Continue antiplatelet therapy as prescribed Continue management of CAD, HTN and Hyperlipidemia Healthy heart diet,  encouraged exercise at least 4 times per week Follow up in 12 months with duplex ultrasound and physical exam   3. Coronary artery abnormality Continue cardiac and antihypertensive medications as already ordered and  reviewed, no changes at this time.  Continue statin as ordered and reviewed, no changes at this time  Nitrates PRN for chest pain   4. Essential hypertension Continue antihypertensive medications as already ordered, these medications have been reviewed and there are no changes at this  time.   5. Controlled type 2 DM with peripheral circulatory disorder (HCC) Continue hypoglycemic medications as already ordered, these medications have been reviewed and there are no changes at this time.  Hgb A1C to be monitored as already arranged by primary service     Hortencia Pilar, MD  06/01/2018 9:36 AM

## 2018-06-14 DIAGNOSIS — Z794 Long term (current) use of insulin: Secondary | ICD-10-CM | POA: Diagnosis not present

## 2018-06-14 DIAGNOSIS — B351 Tinea unguium: Secondary | ICD-10-CM | POA: Diagnosis not present

## 2018-06-14 DIAGNOSIS — L97521 Non-pressure chronic ulcer of other part of left foot limited to breakdown of skin: Secondary | ICD-10-CM | POA: Diagnosis not present

## 2018-06-14 DIAGNOSIS — E1140 Type 2 diabetes mellitus with diabetic neuropathy, unspecified: Secondary | ICD-10-CM | POA: Diagnosis not present

## 2018-06-14 DIAGNOSIS — L97511 Non-pressure chronic ulcer of other part of right foot limited to breakdown of skin: Secondary | ICD-10-CM | POA: Diagnosis not present

## 2018-06-15 ENCOUNTER — Other Ambulatory Visit: Payer: Self-pay | Admitting: Podiatry

## 2018-07-06 ENCOUNTER — Other Ambulatory Visit: Payer: Self-pay | Admitting: Internal Medicine

## 2018-07-12 ENCOUNTER — Other Ambulatory Visit: Payer: Self-pay

## 2018-07-12 ENCOUNTER — Encounter
Admission: RE | Admit: 2018-07-12 | Discharge: 2018-07-12 | Disposition: A | Discharge status: Home / Self Care | Payer: Medicare Other | Source: Ambulatory Visit | Attending: Podiatry | Admitting: Podiatry

## 2018-07-12 ENCOUNTER — Encounter (INDEPENDENT_AMBULATORY_CARE_PROVIDER_SITE_OTHER): Payer: Medicare Other

## 2018-07-12 ENCOUNTER — Ambulatory Visit (INDEPENDENT_AMBULATORY_CARE_PROVIDER_SITE_OTHER): Payer: Medicare Other | Admitting: Vascular Surgery

## 2018-07-12 DIAGNOSIS — Z01810 Encounter for preprocedural cardiovascular examination: Secondary | ICD-10-CM | POA: Diagnosis not present

## 2018-07-12 DIAGNOSIS — R9431 Abnormal electrocardiogram [ECG] [EKG]: Secondary | ICD-10-CM | POA: Diagnosis not present

## 2018-07-12 DIAGNOSIS — M2041 Other hammer toe(s) (acquired), right foot: Secondary | ICD-10-CM | POA: Diagnosis not present

## 2018-07-12 DIAGNOSIS — L97511 Non-pressure chronic ulcer of other part of right foot limited to breakdown of skin: Secondary | ICD-10-CM | POA: Insufficient documentation

## 2018-07-12 DIAGNOSIS — I10 Essential (primary) hypertension: Secondary | ICD-10-CM | POA: Insufficient documentation

## 2018-07-12 DIAGNOSIS — I2510 Atherosclerotic heart disease of native coronary artery without angina pectoris: Secondary | ICD-10-CM | POA: Diagnosis not present

## 2018-07-12 DIAGNOSIS — Z01818 Encounter for other preprocedural examination: Secondary | ICD-10-CM | POA: Insufficient documentation

## 2018-07-12 HISTORY — DX: Nontoxic single thyroid nodule: E04.1

## 2018-07-12 LAB — CBC
HCT: 37.6 % (ref 36.0–46.0)
Hemoglobin: 12.6 g/dL (ref 12.0–15.0)
MCH: 28.6 pg (ref 26.0–34.0)
MCHC: 33.5 g/dL (ref 30.0–36.0)
MCV: 85.5 fL (ref 80.0–100.0)
Platelets: 240 10*3/uL (ref 150–400)
RBC: 4.4 MIL/uL (ref 3.87–5.11)
RDW: 13 % (ref 11.5–15.5)
WBC: 9.1 10*3/uL (ref 4.0–10.5)
nRBC: 0 % (ref 0.0–0.2)

## 2018-07-12 LAB — BASIC METABOLIC PANEL
Anion gap: 9 (ref 5–15)
BUN: 32 mg/dL — AB (ref 6–20)
CO2: 24 mmol/L (ref 22–32)
Calcium: 8.8 mg/dL — ABNORMAL LOW (ref 8.9–10.3)
Chloride: 103 mmol/L (ref 98–111)
Creatinine, Ser: 0.99 mg/dL (ref 0.44–1.00)
GFR calc Af Amer: >60 mL/min (ref >60)
GFR calc non Af Amer: >60 mL/min (ref >60)
Glucose, Bld: 130 mg/dL — ABNORMAL HIGH (ref 70–99)
Potassium: 4 mmol/L (ref 3.5–5.1)
Sodium: 136 mmol/L (ref 135–145)

## 2018-07-12 NOTE — Patient Instructions (Addendum)
Your procedure is scheduled on: Friday, July 16, 2018 Report to Day Surgery on the 2nd floor of the [facility]. To find out your arrival time, please call [phone] between 1PM - 3PM on: Thursday, July 15, 2018  REMEMBER: Instructions that are not followed completely may result in serious medical risk, up to and including death; or upon the discretion of your surgeon and anesthesiologist your surgery may need to be rescheduled.  Do not eat food after midnight the night before surgery.  No gum chewing, lozengers or hard candies.  You may however, drink water up to 2 hours before you are scheduled to arrive for your surgery. Do not drink anything within 2 hours of the start of your surgery.  No Alcohol for 24 hours before or after surgery.  No Smoking including e-cigarettes for 24 hours prior to surgery.  No chewable tobacco products for at least 6 hours prior to surgery.  No nicotine patches on the day of surgery.  On the morning of surgery brush your teeth with toothpaste and water, you may rinse your mouth with mouthwash if you wish. Do not swallow any toothpaste or mouthwash.  Notify your doctor if there is any change in your medical condition (cold, fever, infection).  Do not wear jewelry, make-up, hairpins, clips or nail polish.  Do not wear lotions, powders, or perfumes.   Do not shave 48 hours prior to surgery.   Contacts and dentures may not be worn into surgery.  Do not bring valuables to the hospital, including drivers license, insurance or credit cards.  [place] is not responsible for any belongings or valuables.   TAKE THESE MEDICATIONS THE MORNING OF SURGERY:  1.  Alprazolam (if needed for anxiety) 2.  Gabapentin  Use CHG Soap as directed on instruction sheet.  Take 1/2 of usual insulin dose the night before surgery and none on the morning of surgery. Only take 15 units of LEVEMIR insulin the night before surgery and no insulin on the morning of  surgery.  Follow recommendations from Cardiologist regarding stopping Plavix. (you may continue to take the Plavix)  NOW!  Stop Anti-inflammatories (NSAIDS) such as Advil, Aleve, Ibuprofen, Motrin, Naproxen, Naprosyn and Aspirin based products such as Excedrin, Goodys Powder, BC Powder. (May take Tylenol or Acetaminophen if needed.)  NOW!  Stop ANY OVER THE COUNTER supplements until after surgery. (May continue Vitamin D.)  Wear comfortable clothing (specific to your surgery type) to the hospital.  If you are being discharged the day of surgery, you will not be allowed to drive home. You will need a responsible adult to drive you home and stay with you that night.   If you are taking public transportation, you will need to have a responsible adult with you. Please confirm with your physician that it is acceptable to use public transportation.   Please call [phone] if you have any questions about these instructions.

## 2018-07-13 NOTE — Pre-Procedure Instructions (Signed)
EKG OK BY DR KARENZ 

## 2018-07-14 ENCOUNTER — Encounter: Payer: Self-pay | Admitting: Internal Medicine

## 2018-07-14 ENCOUNTER — Ambulatory Visit (INDEPENDENT_AMBULATORY_CARE_PROVIDER_SITE_OTHER): Payer: Medicare Other | Admitting: Internal Medicine

## 2018-07-14 ENCOUNTER — Ambulatory Visit (INDEPENDENT_AMBULATORY_CARE_PROVIDER_SITE_OTHER): Payer: Medicare Other

## 2018-07-14 VITALS — BP 104/62 | HR 72 | Temp 98.2°F | Resp 16 | Ht 66.0 in | Wt 159.0 lb

## 2018-07-14 DIAGNOSIS — Z01818 Encounter for other preprocedural examination: Secondary | ICD-10-CM | POA: Diagnosis not present

## 2018-07-14 DIAGNOSIS — E1151 Type 2 diabetes mellitus with diabetic peripheral angiopathy without gangrene: Secondary | ICD-10-CM | POA: Diagnosis not present

## 2018-07-14 DIAGNOSIS — R059 Cough, unspecified: Secondary | ICD-10-CM

## 2018-07-14 DIAGNOSIS — Z0000 Encounter for general adult medical examination without abnormal findings: Secondary | ICD-10-CM

## 2018-07-14 DIAGNOSIS — R05 Cough: Secondary | ICD-10-CM

## 2018-07-14 NOTE — Patient Instructions (Addendum)
I am checking an x ray today since you are having surgery   Return for  fasting labs  (4 hours of fasting needed for lipids)  On or before the day of your CPE

## 2018-07-14 NOTE — Progress Notes (Addendum)
Patient ID: Laura Reed, female    DOB: 04-03-1961  Age: 58 y.o. MRN: [id number]  The patient is here for annual preventive  examination and management of other chronic and acute problems.   The risk factors are reflected in the social history.  The roster of all physicians providing medical care to patient - is listed in the Snapshot section of the chart.  Activities of daily living:  The patient is 100% independent in all ADLs: dressing, toileting, feeding as well as independent mobility  Home safety : The patient has smoke detectors in the home. They wear seatbelts.  There are no firearms at home. There is no violence in the home.   There is no risks for hepatitis, STDs or HIV. There is no   history of blood transfusion. They have no travel history to infectious disease endemic areas of the world.  The patient has seen their dentist in the last six month. They have seen their eye doctor in the last year. They admit to slight hearing difficulty with regard to whispered voices and some television programs.  They have deferred audiologic testing in the last year.  They do not  have excessive sun exposure. Discussed the need for sun protection: hats, long sleeves and use of sunscreen if there is significant sun exposure.   Diet: the importance of a healthy diet is discussed. They do have a healthy diet.  The benefits of regular aerobic exercise were discussed. She walks 4 times per week ,  20 minutes.   Depression screen: there are no signs or vegative symptoms of depression- irritability, change in appetite, anhedonia, sadness/tearfullness.  Cognitive assessment: the patient manages all their financial and personal affairs and is actively engaged. They could relate day,date,year and events; recalled 2/3 objects at 3 minutes; performed clock-face test normally.  The following portions of the patient's history were reviewed and updated as appropriate: allergies, current medications, past  family history, past medical history,  past surgical history, past social history  and problem list.  Visual acuity was not assessed per patient preference since she has regular follow up with her ophthalmologist. Hearing and body mass index were assessed and reviewed.   During the course of the visit the patient was educated and counseled about appropriate screening and preventive services including : fall prevention , diabetes screening, nutrition counseling, colorectal cancer screening, and recommended immunizations.    CC: The primary encounter diagnosis was Cough. Diagnoses of Controlled type 2 DM with peripheral circulatory disorder [facility]), Preoperative evaluation of a medical condition to rule out surgical contraindications (TAR required), and Encounter for preventive health examination were also pertinent to this visit. preoperative evaluation to rule out surgical contraindication . Patient is scheduled to have a right foot 2nd toe amputation due to persistent ulceration of  Tip due to acquired  hammertoe  Formation .   She Has had a non productive cough without fevers for the past 3 or 4 .  No sick contacts,  she has had exposure to environmental irritants (dust)  during home renovations.  Patient is taking aspirin and plavix but was told by anesthesiology she would not need to stop either medication   She has no history of anesthesia reaction since 2009 when she developed pulmonary edema requiring therapeutic thoracentesis . She has had many surgeries since then with no Subsequent events    2) DM follow up:  She reports that her Blood sugars have been labile   None over 200.  Marland Kitchen  Lowest was 99 .  Elevation are attributing to holiday dietary indiscretions   Lab Results  Component Value Date   HGBA1C 8.7 (H) 03/25/2018    preoperative evaluation to rule out surgical contraindication . Patient is scheduled to have a right foot 2nd toe amputation due to persistent ulceration of  Tip due to  acquired  hammertoe  Formation .   She Has had a non productive cough without fevers for the past 3 or 4 .  No sick contacts,  she has had exposure to environmental irritants (dust)  during home renovations.  Patient is taking aspirin and plavix but was told by anesthesiology she would not need to stop either medication   She has no history of anesthesia reaction since 2009 when she developed pulmonary edema requiring therapeutic thoracentesis . She has had many surgeries since then with no Subsequent events    2) DM follow up:  She reports that her Blood sugars have been labile   None over 200.  Marland Kitchen  Lowest was 99 .  Elevation are attributing to holiday dietary indiscretions    History Laura Reed has a past medical history of Anxiety, Bladder cancer ([place]), CHF (congestive heart failure) ([place]), Coronary artery disease, Diabetes mellitus, Heart murmur, Hemorrhoid, Hypertension, Neuropathy, PVD (peripheral vascular disease) ([place]), Thyroid nodule, and Urothelial carcinoma of kidney ([place]) (10/31/2014).   She has a past surgical history that includes Coronary artery bypass graft (2009); Arterial bypass surgry (2009, 2013 x 2); Cesarean section; Carotid endarterectomy (Right, 01/2014); Cholecystectomy (03-03-12); Colonoscopy w/ biopsies (04/28/2012); Nephrectomy (Left, 10-31-14); Hernia repair (10-31-14); Amputation toe; Cataract extraction w/PHACO (Right, 12/14/2014); Cardiac catheterization (Left, 05/01/2015); Cardiac catheterization (05/01/2015); Cardiac catheterization (Left, 02/20/2015); Cardiac catheterization; Transurethral resection of bladder tumor with mitomycin-c (N/A, 09/01/2016); Cystoscopy w/ retrogrades (Right, 09/01/2016); Lower Extremity Angiography (Left, 12/10/2016); Lower Extremity Angiography (Left, 02/02/2018); Lower Extremity Angiography (Left, 05/05/2018); Eye surgery; and Amputation toe (Right, 07/16/2018).   Her family history includes Breast cancer in her maternal grandmother; Cancer (age of  onset: 66) in her mother; Cancer (age of onset: 80) in her father; Diabetes in her son.She reports that she quit smoking about 7 years ago. Her smoking use included cigarettes. She has a 70.00 pack-year smoking history. She has never used smokeless tobacco. She reports previous drug use. Drug: Cocaine. She reports that she does not drink alcohol.  Outpatient Medications Prior to Visit  Medication Sig Dispense Refill  . ACCU-CHEK AVIVA PLUS test strip TEST BLOOD SUGAR 2 TO 3 TIMES DAILY 100 each 0  . acetaminophen (TYLENOL) 500 MG tablet Take 1,000 mg by mouth every 6 (six) hours as needed for mild pain or headache.     . ALPRAZolam (XANAX) 0.5 MG tablet Take 1 tablet (0.5 mg total) by mouth daily as needed. for anxiety 30 tablet 5  . amLODipine (NORVASC) 5 MG tablet TAKE 1 TABLET(5 MG) BY MOUTH DAILY 90 tablet 1  . aspirin 81 MG chewable tablet Chew 81 mg by mouth daily.    Marland Kitchen atorvastatin (LIPITOR) 20 MG tablet TAKE 1 TABLET BY MOUTH EVERY DAY 90 tablet 1  . B-D ULTRAFINE III SHORT PEN 31G X 8 MM MISC TEST BLOOD SUGAR TWICE DAILY 100 each 0  . Cholecalciferol (VITAMIN D3) 2000 UNITS TABS Take 2,000 Units by mouth daily after supper.     . clopidogrel (PLAVIX) 75 MG tablet TAKE 1 TABLET BY MOUTH EVERY DAY 90 tablet 1  . DIGOX 125 MCG tablet TAKE 1 TABLET BY MOUTH EVERY DAY 90 tablet  1  . gabapentin (NEURONTIN) 600 MG tablet TAKE 1 TABLET BY MOUTH THREE TIMES DAILY 270 tablet 1  . HUMALOG MIX 75/25 KWIKPEN (75-25) 100 UNIT/ML Kwikpen INJECT 30 UNITS UNDER THE SKIN EVERY MORNING AND 36 UNITS EVERY EVENING 30 mL 2  . Lancets Misc. MISC Use to check blood sugars 3 times daily 100 each 11  . LEVEMIR FLEXTOUCH 100 UNIT/ML Pen INJECT 40 UNITS UNDER THE SKIN EVERY DAY AT 10 PM 15 mL 2  . lisinopril (PRINIVIL,ZESTRIL) 5 MG tablet Take 1 tablet (5 mg total) by mouth daily. 90 tablet 3  . neomycin-bacitracin-polymyxin (NEOSPORIN) ointment Apply 1 application topically as needed for wound care.    . TRUEPLUS  LANCETS 28G MISC CHECK BLOOD SUGAR FOUR TIMES DAILY 200 each 0   No facility-administered medications prior to visit.     Review of Systems   Patient denies headache, fevers, malaise, unintentional weight loss, skin rash, eye pain, sinus congestion and sinus pain, sore throat, dysphagia,  hemoptysis , cough, dyspnea, wheezing, chest pain, palpitations, orthopnea, edema, abdominal pain, nausea, melena, diarrhea, constipation, flank pain, dysuria, hematuria, urinary  Frequency, nocturia, numbness, tingling, seizures,  Focal weakness, Loss of consciousness,  Tremor, insomnia, depression, anxiety, and suicidal ideation.      Objective:  BP 104/62 (BP Location: Left Arm, Patient Position: Sitting, Cuff Size: Normal)   Pulse 72   Temp 98.2 F (36.8 C) (Oral)   Resp 16   Ht 5\' 6"  (1.676 m)   Wt 159 lb (72.1 kg)   SpO2 97%   BMI 25.66 kg/m   Physical Exam   General appearance: alert, cooperative and appears stated age Ears: normal TM's and external ear canals both ears Throat: lips, mucosa, and tongue normal; teeth and gums normal Neck: no adenopathy, no carotid bruit, supple, symmetrical, trachea midline and thyroid not enlarged, symmetric, no tenderness/mass/nodules Back: symmetric, no curvature. ROM normal. No CVA tenderness. Lungs: clear to auscultation bilaterally Heart: regular rate and rhythm, S1, S2 normal, no murmur, click, rub or gallop Abdomen: soft, non-tender; bowel sounds normal; no masses,  no organomegaly Pulses: 2+ and symmetric Skin: Skin color, texture, turgor normal. No rashes or lesions Lymph nodes: Cervical, supraclavicular, and axillary nodes normal.    Assessment & Plan:   Problem List Items Addressed This Visit    Controlled type 2 DM with peripheral circulatory disorder (HCC)    Loss of control due to dietary nonadherence..  currently Using mixed insulin twice daily 30 units in the am,.  36 in the evening, and 40 units of Levemir daily.  Her a1c is due and  preoperative clearance will be given if her a1c is < 8.0      Relevant Orders   Hemoglobin A1c   Comprehensive metabolic panel   Lipid panel   Encounter for preventive health examination    age appropriate education and counseling updated, referrals for preventative services and immunizations addressed, dietary and smoking counseling addressed, most recent labs reviewed.  I have personally reviewed and have noted:  1) the patient's medical and social history 2) The pt's use of alcohol, tobacco, and illicit drugs 3) The patient's current medications and supplements 4) Functional ability including ADL's, fall risk, home safety risk, hearing and visual impairment 5) Diet and physical activities 6) Evidence for depression or mood disorder 7) The patient's height, weight, and BMI have been recorded in the chart  I have made referrals, and provided counseling and education based on review of the above  Preoperative evaluation of a medical condition to rule out surgical contraindications (TAR required)    Patient  is considered to be at low risk  For perioperative complications  Based on today's exam and history and the surgery planned.  Chest x ray was done due to cough and was negative for infiltrates.        Other Visit Diagnoses    Cough    -  Primary   Relevant Orders   DG Chest 2 View (Completed)      I am having Laura Reed E. Shan maintain her Vitamin D3, aspirin, B-D ULTRAFINE III SHORT PEN, acetaminophen, ACCU-CHEK AVIVA PLUS, TRUEPLUS LANCETS 28G, Lancets Misc., gabapentin, neomycin-bacitracin-polymyxin, DIGOX, amLODipine, ALPRAZolam, LEVEMIR FLEXTOUCH, lisinopril, HUMALOG MIX 75/25 KWIKPEN, clopidogrel, and atorvastatin.  No orders of the defined types were placed in this encounter.   There are no discontinued medications.  Follow-up: No follow-ups on file.   [name], MD

## 2018-07-15 MED ORDER — CEFAZOLIN SODIUM-DEXTROSE 2-4 GM/100ML-% IV SOLN
2.0000 g | INTRAVENOUS | Status: AC
  Administered 2018-07-16: 2 g via INTRAVENOUS

## 2018-07-16 ENCOUNTER — Ambulatory Visit: Payer: Medicare Other | Admitting: Anesthesiology

## 2018-07-16 ENCOUNTER — Encounter
Admission: RE | Disposition: A | Discharge status: Home / Self Care | Payer: Self-pay | Source: Home / Self Care | Attending: Podiatry

## 2018-07-16 ENCOUNTER — Ambulatory Visit
Admission: RE | Admit: 2018-07-16 | Discharge: 2018-07-16 | Disposition: A | Discharge status: Home / Self Care | Payer: Medicare Other | Attending: Podiatry | Admitting: Podiatry

## 2018-07-16 ENCOUNTER — Other Ambulatory Visit: Payer: Self-pay

## 2018-07-16 DIAGNOSIS — M868X7 Other osteomyelitis, ankle and foot: Secondary | ICD-10-CM | POA: Insufficient documentation

## 2018-07-16 DIAGNOSIS — E1151 Type 2 diabetes mellitus with diabetic peripheral angiopathy without gangrene: Secondary | ICD-10-CM | POA: Insufficient documentation

## 2018-07-16 DIAGNOSIS — Z7982 Long term (current) use of aspirin: Secondary | ICD-10-CM | POA: Diagnosis not present

## 2018-07-16 DIAGNOSIS — E11621 Type 2 diabetes mellitus with foot ulcer: Secondary | ICD-10-CM | POA: Insufficient documentation

## 2018-07-16 DIAGNOSIS — Z79899 Other long term (current) drug therapy: Secondary | ICD-10-CM | POA: Insufficient documentation

## 2018-07-16 DIAGNOSIS — E1140 Type 2 diabetes mellitus with diabetic neuropathy, unspecified: Secondary | ICD-10-CM | POA: Insufficient documentation

## 2018-07-16 DIAGNOSIS — Z794 Long term (current) use of insulin: Secondary | ICD-10-CM | POA: Diagnosis not present

## 2018-07-16 DIAGNOSIS — L97519 Non-pressure chronic ulcer of other part of right foot with unspecified severity: Secondary | ICD-10-CM | POA: Insufficient documentation

## 2018-07-16 DIAGNOSIS — M2041 Other hammer toe(s) (acquired), right foot: Secondary | ICD-10-CM | POA: Diagnosis not present

## 2018-07-16 DIAGNOSIS — I110 Hypertensive heart disease with heart failure: Secondary | ICD-10-CM | POA: Diagnosis not present

## 2018-07-16 DIAGNOSIS — I509 Heart failure, unspecified: Secondary | ICD-10-CM | POA: Diagnosis not present

## 2018-07-16 DIAGNOSIS — L97511 Non-pressure chronic ulcer of other part of right foot limited to breakdown of skin: Secondary | ICD-10-CM | POA: Diagnosis not present

## 2018-07-16 HISTORY — PX: AMPUTATION TOE: SHX6595

## 2018-07-16 LAB — URINE DRUG SCREEN, QUALITATIVE (ARMC ONLY)
Amphetamines, Ur Screen: NOT DETECTED
Barbiturates, Ur Screen: NOT DETECTED
Benzodiazepine, Ur Scrn: NOT DETECTED
Cannabinoid 50 Ng, Ur ~~LOC~~: NOT DETECTED
Cocaine Metabolite,Ur ~~LOC~~: NOT DETECTED
MDMA (ECSTASY) UR SCREEN: NOT DETECTED
Methadone Scn, Ur: NOT DETECTED
Opiate, Ur Screen: NOT DETECTED
Phencyclidine (PCP) Ur S: NOT DETECTED
Tricyclic, Ur Screen: NOT DETECTED

## 2018-07-16 LAB — GLUCOSE, CAPILLARY
Glucose-Capillary: 56 mg/dL — ABNORMAL LOW (ref 70–99)
Glucose-Capillary: 94 mg/dL (ref 70–99)
Glucose-Capillary: 107 mg/dL — ABNORMAL HIGH (ref 70–99)

## 2018-07-16 SURGERY — AMPUTATION, TOE
Anesthesia: General | Laterality: Right

## 2018-07-16 MED ORDER — PROPOFOL 10 MG/ML IV BOLUS
INTRAVENOUS | Status: AC
  Filled 2018-07-16: qty 40

## 2018-07-16 MED ORDER — DEXTROSE-NACL 5-0.9 % IV SOLN
INTRAVENOUS | Status: DC
  Administered 2018-07-16: 07:00:00 via INTRAVENOUS

## 2018-07-16 MED ORDER — FENTANYL CITRATE (PF) 100 MCG/2ML IJ SOLN
INTRAMUSCULAR | Status: DC | PRN
  Administered 2018-07-16: 25 ug via INTRAVENOUS

## 2018-07-16 MED ORDER — NEOMYCIN-POLYMYXIN B GU 40-200000 IR SOLN
Status: AC
  Filled 2018-07-16: qty 1

## 2018-07-16 MED ORDER — DEXTROSE 50 % IV SOLN
12.5000 g | Freq: Once | INTRAVENOUS | Status: AC
  Administered 2018-07-16: 12.5 g via INTRAVENOUS

## 2018-07-16 MED ORDER — POVIDONE-IODINE 7.5 % EX SOLN
Freq: Once | CUTANEOUS | Status: AC
  Administered 2018-07-16: 07:00:00 via TOPICAL

## 2018-07-16 MED ORDER — CEFAZOLIN SODIUM-DEXTROSE 2-4 GM/100ML-% IV SOLN
INTRAVENOUS | Status: AC
  Administered 2018-07-16: 2 g via INTRAVENOUS
  Filled 2018-07-16: qty 100

## 2018-07-16 MED ORDER — MIDAZOLAM HCL 2 MG/2ML IJ SOLN
INTRAMUSCULAR | Status: AC
  Filled 2018-07-16: qty 2

## 2018-07-16 MED ORDER — SODIUM CHLORIDE 0.9 % IV SOLN: INTRAVENOUS | Status: DC

## 2018-07-16 MED ORDER — FAMOTIDINE 20 MG PO TABS
20.0000 mg | ORAL_TABLET | Freq: Once | ORAL | Status: AC
  Administered 2018-07-16: 20 mg via ORAL

## 2018-07-16 MED ORDER — MIDAZOLAM HCL 2 MG/2ML IJ SOLN
INTRAMUSCULAR | Status: DC | PRN
  Administered 2018-07-16: 2 mg via INTRAVENOUS

## 2018-07-16 MED ORDER — FENTANYL CITRATE (PF) 100 MCG/2ML IJ SOLN
INTRAMUSCULAR | Status: AC
  Filled 2018-07-16: qty 2

## 2018-07-16 MED ORDER — FAMOTIDINE 20 MG PO TABS
ORAL_TABLET | ORAL | Status: AC
  Filled 2018-07-16: qty 1

## 2018-07-16 MED ORDER — BUPIVACAINE HCL (PF) 0.5 % IJ SOLN
INTRAMUSCULAR | Status: AC
  Filled 2018-07-16: qty 30

## 2018-07-16 MED ORDER — ONDANSETRON HCL 4 MG/2ML IJ SOLN: 4.0000 mg | Freq: Once | INTRAMUSCULAR | Status: DC | PRN

## 2018-07-16 MED ORDER — DEXTROSE 50 % IV SOLN
INTRAVENOUS | Status: AC
  Administered 2018-07-16: 12.5 g via INTRAVENOUS
  Filled 2018-07-16: qty 50

## 2018-07-16 MED ORDER — BUPIVACAINE HCL 0.5 % IJ SOLN
INTRAMUSCULAR | Status: DC | PRN
  Administered 2018-07-16: 8 mL

## 2018-07-16 MED ORDER — HYDROCODONE-ACETAMINOPHEN 5-325 MG PO TABS: 1.0000 | ORAL_TABLET | ORAL | 0 refills | Status: DC | PRN

## 2018-07-16 MED ORDER — PROPOFOL 10 MG/ML IV BOLUS
INTRAVENOUS | Status: DC | PRN
  Administered 2018-07-16: 40 mg via INTRAVENOUS

## 2018-07-16 MED ORDER — LIDOCAINE HCL (CARDIAC) PF 100 MG/5ML IV SOSY
PREFILLED_SYRINGE | INTRAVENOUS | Status: DC | PRN
  Administered 2018-07-16: 80 mg via INTRAVENOUS

## 2018-07-16 MED ORDER — PROPOFOL 500 MG/50ML IV EMUL
INTRAVENOUS | Status: DC | PRN
  Administered 2018-07-16: 50 ug/kg/min via INTRAVENOUS

## 2018-07-16 MED ORDER — FENTANYL CITRATE (PF) 100 MCG/2ML IJ SOLN: 25.0000 ug | INTRAMUSCULAR | Status: DC | PRN

## 2018-07-16 SURGICAL SUPPLY — 47 items
BANDAGE ACE 4X5 VEL STRL LF (GAUZE/BANDAGES/DRESSINGS) ×3 IMPLANT
BLADE MED AGGRESSIVE (BLADE) ×3 IMPLANT
BLADE OSC/SAGITTAL MD 5.5X18 (BLADE) ×3 IMPLANT
BLADE SURG 15 STRL LF DISP TIS (BLADE) ×2 IMPLANT
BLADE SURG 15 STRL SS (BLADE) ×4
BLADE SURG MINI STRL (BLADE) ×3 IMPLANT
BNDG CONFORM 2 STRL LF (GAUZE/BANDAGES/DRESSINGS) ×3 IMPLANT
BNDG ESMARK 4X12 TAN STRL LF (GAUZE/BANDAGES/DRESSINGS) ×3 IMPLANT
BNDG GAUZE 4.5X4.1 6PLY STRL (MISCELLANEOUS) ×3 IMPLANT
CANISTER SUCT 1200ML W/VALVE (MISCELLANEOUS) ×3 IMPLANT
CLOSURE WOUND 1/4X4 (GAUZE/BANDAGES/DRESSINGS) ×1
COVER WAND RF STERILE (DRAPES) ×3 IMPLANT
CUFF TOURN 18 STER (MISCELLANEOUS) ×1 IMPLANT
CUFF TOURN DUAL PL 12 NO SLV (MISCELLANEOUS) ×3 IMPLANT
DRAPE FLUOR MINI C-ARM 54X84 (DRAPES) ×1 IMPLANT
DURAPREP 26ML APPLICATOR (WOUND CARE) ×3 IMPLANT
ELECT REM PT RETURN 9FT ADLT (ELECTROSURGICAL) ×3
ELECTRODE REM PT RTRN 9FT ADLT (ELECTROSURGICAL) ×1 IMPLANT
GAUZE PETRO XEROFOAM 1X8 (MISCELLANEOUS) ×3 IMPLANT
GAUZE SPONGE 4X4 12PLY STRL (GAUZE/BANDAGES/DRESSINGS) ×3 IMPLANT
GLOVE BIO SURGEON STRL SZ7.5 (GLOVE) ×3 IMPLANT
GLOVE INDICATOR 8.0 STRL GRN (GLOVE) ×3 IMPLANT
GOWN STRL REUS W/ TWL LRG LVL3 (GOWN DISPOSABLE) ×2 IMPLANT
GOWN STRL REUS W/TWL LRG LVL3 (GOWN DISPOSABLE) ×4
HANDPIECE VERSAJET DEBRIDEMENT (MISCELLANEOUS) ×3 IMPLANT
KIT TURNOVER KIT A (KITS) ×3 IMPLANT
LABEL OR SOLS (LABEL) ×3 IMPLANT
NDL FILTER BLUNT 18X1 1/2 (NEEDLE) ×1 IMPLANT
NDL HYPO 25X1 1.5 SAFETY (NEEDLE) ×2 IMPLANT
NEEDLE FILTER BLUNT 18X 1/2SAF (NEEDLE) ×2
NEEDLE FILTER BLUNT 18X1 1/2 (NEEDLE) ×1 IMPLANT
NEEDLE HYPO 25X1 1.5 SAFETY (NEEDLE) ×6 IMPLANT
NS IRRIG 500ML POUR BTL (IV SOLUTION) ×3 IMPLANT
PACK EXTREMITY ARMC (MISCELLANEOUS) ×3 IMPLANT
SOL .9 NS 3000ML IRR  AL (IV SOLUTION) ×2
SOL .9 NS 3000ML IRR UROMATIC (IV SOLUTION) ×1 IMPLANT
SOL PREP PVP 2OZ (MISCELLANEOUS) ×3
SOLUTION PREP PVP 2OZ (MISCELLANEOUS) ×1 IMPLANT
STOCKINETTE STRL 6IN 960660 (GAUZE/BANDAGES/DRESSINGS) ×3 IMPLANT
STRIP CLOSURE SKIN 1/4X4 (GAUZE/BANDAGES/DRESSINGS) ×2 IMPLANT
SUT ETHILON 3-0 FS-10 30 BLK (SUTURE) ×3
SUT ETHILON 4-0 (SUTURE)
SUT ETHILON 4-0 FS2 18XMFL BLK (SUTURE)
SUTURE EHLN 3-0 FS-10 30 BLK (SUTURE) ×1 IMPLANT
SUTURE ETHLN 4-0 FS2 18XMF BLK (SUTURE) IMPLANT
SWAB DUAL CULTURE TRANS RED ST (MISCELLANEOUS) ×1 IMPLANT
SYR 10ML LL (SYRINGE) ×3 IMPLANT

## 2018-07-16 NOTE — Transfer of Care (Signed)
Immediate Anesthesia Transfer of Care Note  Patient: Laura Reed  Procedure(s) Performed: AMPUTATION TOE/MPJ right 2nd (Right )  Patient Location: PACU  Anesthesia Type:General  Level of Consciousness: awake, alert  and oriented  Airway & Oxygen Therapy: Patient Spontanous Breathing  Post-op Assessment: Report given to RN and Post -op Vital signs reviewed and stable  Post vital signs: Reviewed and stable  Last Vitals:  Vitals Value Taken Time  BP    Temp    Pulse    Resp    SpO2      Last Pain:  Vitals:   07/16/18 0608  TempSrc: Tympanic  PainSc: 0-No pain         Complications: No apparent anesthesia complications

## 2018-07-16 NOTE — Op Note (Signed)
Date of operation: 07/16/2018.  Surgeon: Durward Fortes D.P.M.  Preoperative diagnosis: Chronic hammertoe with ulceration right second toe.  Postoperative diagnosis: Same.  Procedure: Amputation right second toe.  Anesthesia: Local MAC.  Hemostasis: Pneumatic tourniquet right ankle 250 mmHg.  Estimated blood loss: Less than 5 cc.  Pathology: Right second toe.  Cultures: None.  Complications: None apparent.  Operative indications: This is a 58 year old female with a chronic history of hammertoe and ulceration on the right second toe.  Also history of diabetes with neuropathy and peripheral vascular disease.  Patient elects for amputation of the second toe after recent revascularization.  Operative procedure: Patient was taken to the operating room and placed on the table in the supine position.  Following satisfactory anesthesia the right foot was anesthetized with 8 cc of 0.5% bupivacaine plain around the second metatarsal.  Pneumatic tourniquet applied at the level of the right ankle and the foot was prepped and draped in the usual sterile fashion.  The foot was exsanguinated and the tourniquet inflated to 250 mmHg.      Attention was then directed to the distal aspect of the right foot where an elliptical incision was made coursing dorsal to plantar around the medial and lateral base of the second toe.  Incision was carried sharply down to the level of the bone and dissection carried back to the level of the metatarsal phalangeal joint where the toe was disarticulated and removed in toto.  The wound was flushed with copious amounts of sterile saline and closed using 4-0 nylon vertical mattress and simple interrupted sutures.  Xeroform 4 x 4's and con form applied to the foot and the tourniquet was released.  Kerlix and an Ace wrap applied.  Patient tolerated the procedure and anesthesia well and was awakened and transported to the PACU with vital signs stable and in good condition.

## 2018-07-16 NOTE — H&P (Signed)
Medical history and physical in the chart was reviewed.  Patient presents today for amputation of her right second toe for a chronic ulceration.  Vascular: DP and PT pulses are palpable but diminished. Neurological: Loss of sensation distally in the feet. Integument: The skin is warm dry and somewhat atrophic.  Chronic ulceration noted distal tip of the right second toe.  No cellulitis or infection. Musculoskeletal: Previous amputation of the right fourth and fifth ray as well as the left hallux and third toe.  Chronic hammertoe contracture of the right second toe.  Assessment: Chronic hammertoe with ulceration right second toe  Plan: Patient presents today for amputation of the right second toe.  Previously have discussed risks and complications.  Medically stable.  Proceed with surgery for amputation of the right second toe today.

## 2018-07-16 NOTE — OR Nursing (Signed)
Dr Marcello Moores notified patient finger stick blood sugar was 56. Orders received.

## 2018-07-16 NOTE — Anesthesia Post-op Follow-up Note (Signed)
Anesthesia QCDR form completed.        

## 2018-07-16 NOTE — Anesthesia Preprocedure Evaluation (Signed)
Anesthesia Evaluation  Patient identified by MRN, date of birth, ID band Patient awake    Reviewed: Allergy & Precautions, NPO status , Patient's Chart, lab work & pertinent test results  History of Anesthesia Complications Negative for: history of anesthetic complications  Airway Mallampati: II  TM Distance: >3 FB Neck ROM: Full    Dental  (+) Upper Dentures, Lower Dentures   Pulmonary neg sleep apnea, neg COPD, former smoker,    breath sounds clear to auscultation- rhonchi (-) wheezing      Cardiovascular hypertension, Pt. on medications + CAD, + CABG (2009), + Peripheral Vascular Disease and +CHF  (-) Cardiac Stents  Rhythm:Regular Rate:Normal - Systolic murmurs and - Diastolic murmurs    Neuro/Psych neg Seizures PSYCHIATRIC DISORDERS Anxiety negative neurological ROS     GI/Hepatic negative GI ROS, Neg liver ROS,   Endo/Other  diabetes, Insulin Dependent  Renal/GU Renal InsufficiencyRenal disease     Musculoskeletal negative musculoskeletal ROS (+)   Abdominal (+) - obese,   Peds  Hematology negative hematology ROS (+)   Anesthesia Other Findings Past Medical History: No date: Anxiety No date: Bladder cancer (HCC) No date: CHF (congestive heart failure) (HCC) No date: Coronary artery disease No date: Diabetes mellitus No date: Heart murmur No date: Hemorrhoid No date: Hypertension No date: Neuropathy No date: PVD (peripheral vascular disease) ([place]) No date: Thyroid nodule     Comment:  right 10/31/2014: Urothelial carcinoma of kidney ([place])     Comment:  INVASIVE UROTHELIAL CARCINOMA, LOW GRADE. T1, Nx.   Reproductive/Obstetrics                             Anesthesia Physical Anesthesia Plan  ASA: III  Anesthesia Plan: General   Post-op Pain Management:    Induction: Intravenous  PONV Risk Score and Plan: 2 and Propofol infusion  Airway Management Planned: Natural  Airway  Additional Equipment:   Intra-op Plan:   Post-operative Plan:   Informed Consent: I have reviewed the patients History and Physical, chart, labs and discussed the procedure including the risks, benefits and alternatives for the proposed anesthesia with the patient or authorized representative who has indicated his/her understanding and acceptance.   Dental advisory given  Plan Discussed with: CRNA and Anesthesiologist  Anesthesia Plan Comments:         Anesthesia Quick Evaluation

## 2018-07-16 NOTE — Anesthesia Postprocedure Evaluation (Signed)
Anesthesia Post Note  Patient: Laura Reed  Procedure(s) Performed: AMPUTATION TOE/MPJ right 2nd (Right )  Patient location during evaluation: PACU Anesthesia Type: General Level of consciousness: awake and alert and oriented Pain management: pain level controlled Vital Signs Assessment: post-procedure vital signs reviewed and stable Respiratory status: spontaneous breathing, nonlabored ventilation and respiratory function stable Cardiovascular status: blood pressure returned to baseline and stable Postop Assessment: no signs of nausea or vomiting Anesthetic complications: no     Last Vitals:  Vitals:   07/16/18 0841 07/16/18 0904  BP: (!) 113/59 (!) 119/56  Pulse: 69 67  Resp: 18 20  Temp: (!) 36.2 C   SpO2: 100%     Last Pain:  Vitals:   07/16/18 0841  TempSrc: Temporal  PainSc: 0-No pain                 [name]

## 2018-07-16 NOTE — Discharge Instructions (Addendum)
1.  Elevate right lower extremity on pillows.  2.  Keep the bandage on the right foot clean, dry, and do not remove.  3.  Sponge bathe only right lower extremity.  4.  Wear surgical shoe on the right foot whenever walking or standing.  5.  Take 1 pain pill, Norco, every 4 hours only if needed for pain.  AMBULATORY SURGERY  DISCHARGE INSTRUCTIONS   1) The drugs that you were given will stay in your system until tomorrow so for the next 24 hours you should not:  A) Drive an automobile B) Make any legal decisions C) Drink any alcoholic beverage   2) You may resume regular meals tomorrow.  Today it is better to start with liquids and gradually work up to solid foods.  You may eat anything you prefer, but it is better to start with liquids, then soup and crackers, and gradually work up to solid foods.   3) Please notify your doctor immediately if you have any unusual bleeding, trouble breathing, redness and pain at the surgery site, drainage, fever, or pain not relieved by medication.    4) Additional Instructions:        Please contact your physician with any problems or Same Day Surgery at [phone], Monday through Friday 6 am to 4 pm, or [place] at [facility] number at [phone].

## 2018-07-17 NOTE — Assessment & Plan Note (Addendum)
Patient  is considered to be at low risk  For perioperative complications  Based on today's exam and history and the surgery planned.  Chest x ray was done due to cough and was negative for infiltrates.

## 2018-07-17 NOTE — Assessment & Plan Note (Addendum)
Loss of control due to dietary nonadherence..  currently Using mixed insulin twice daily 30 units in the am,.  36 in the evening, and 40 units of Levemir daily.  Her a1c is due and preoperative clearance will be given if her a1c is < 8.0

## 2018-07-18 NOTE — Assessment & Plan Note (Signed)
age appropriate education and counseling updated, referrals for preventative services and immunizations addressed, dietary and smoking counseling addressed, most recent labs reviewed.  I have personally reviewed and have noted:  1) the patient's medical and social history 2) The pt's use of alcohol, tobacco, and illicit drugs 3) The patient's current medications and supplements 4) Functional ability including ADL's, fall risk, home safety risk, hearing and visual impairment 5) Diet and physical activities 6) Evidence for depression or mood disorder 7) The patient's height, weight, and BMI have been recorded in the chart  I have made referrals, and provided counseling and education based on review of the above 

## 2018-07-18 NOTE — Addendum Note (Signed)
Addended by: [name] on: 07/18/2018 06:01 PM   Modules accepted: Level of Service

## 2018-07-19 LAB — SURGICAL PATHOLOGY

## 2018-07-19 NOTE — Progress Notes (Incomplete)
° °  07/19/2018   CC:  No chief complaint on file.   HPI: 58 yo F with with history of pT1N0 left upper tract urothelial carcinoma s/p nephroU 4/016 with low grade Ta bladder recurrence s/p TURBT, mitomycin, right retrograde pyelogram on 2/18.    UA today is fairly unremarkable.  No signs or symptoms of infection.  1- Left Upper Tract Transitional Cell Carcinoma/ Bladder cancer recurrence- s/p LEFT robotic nephroureterectomy on 10/31/2014 as well as combined ventral hernia repair with Dr. Bary Castilla. Post complicated by wound infection now well healed. Pathology pT1N0 with negative margins.  Recent Surveillance: 02/2015 - cysto NED; 05/2015 cysto NED, 08/2015- cysto NED, 11/2015- NED, CT Urogram 11/12/15 negative, poor quality of delayed phase.; 5/2-17- cysto with mild erythema following recent UTI; 02/2016 NED; 08/2016 Lg Ta TCC recurrence, 1 cm, multifocal. RTG neg.  08/2016- TURBT for bladder 1 cm recurrence near bladder neck and left UO, multifocal. R RTG negative. Mitomycin. LgTa. 10/2016- Induction BCG x 6 08/2017- Maintenance BCG x 3  2 - Solitary Kidney - s/p left nephrectomy as per above. Most recent Cr 0.89 09/2017  She does have an extensive smoking history, quit 5years ago but smoked up to 2 packs a day for 35 years. She also has multiple medical comorbidities including history of diabetes, CAD status post CABG, carotid endarterectomy.    NED. A&Ox3.   No respiratory distress   Abd soft, NT, ND Normal external genitalia with patent urethral meatus  Cystoscopy Procedure Note  Patient identification was confirmed, informed consent was obtained, and patient was prepped using Betadine solution.  Lidocaine jelly was administered per urethral meatus.    Preoperative abx where received prior to procedure.    Procedure: - Flexible cystoscope introduced, without any difficulty.   - Thorough search of the bladder revealed:    normal urethral meatus    normal urothelium with  some changes on the posterior wall of the bladder most consistent with cystitis cystica (unchanged, previous cytology negative).  No evidence of papillary tumor.      no stones    no ulcers     no tumors    no urethral polyps    no trabeculation  - Right UO normal. Left UO was surgically absent.  Post-Procedure: - Patient tolerated the procedure well  Assessment/ Plan:  1. Malignant neoplasm of urinary bladder, unspecified site ([place]) No evidence of disease today on cystoscopy We will continue to follow areas which appear to be most consistent with cystitis cystica closely, unchanged from last visit with previously negative cytology Urine cytology today Plan for maintenance BCG with a split dose in the near future She is agreeable to this plan. - Urinalysis, Complete - ciprofloxacin (CIPRO) tablet 500 mg; Take 1 tablet (500 mg total) by mouth once. - lidocaine (XYLOCAINE) 2 % jelly 1 application; Place 1 application into the urethra once.   No follow-ups on file.   Lemar Lofty, am acting as a scribe for Hollice Espy, MD.   {Add Scribe Attestation Statement}

## 2018-07-20 ENCOUNTER — Other Ambulatory Visit: Payer: Self-pay | Admitting: Internal Medicine

## 2018-07-20 MED ORDER — GLUCOSE BLOOD VI STRP: ORAL_STRIP | 0 refills | Status: DC

## 2018-07-20 NOTE — Telephone Encounter (Signed)
Copied from [place] [phone]. Topic: Quick Communication - Rx Refill/Question >> Jul 20, 2018 12:48 PM Sheran Luz wrote: Medication: ACCU-CHEK AVIVA PLUS test strip   Has the patient contacted their pharmacy? Yes, was advised to contact office.   Preferred Pharmacy (with phone number or street name): [facility] #[id number] [address], [place] [phone] (Phone) [phone] (Fax)

## 2018-07-20 NOTE — Telephone Encounter (Signed)
Requested medication (s) are due for refill today:  yes  Requested medication (s) are on the active medication list:  yes  Future visit scheduled:  Yes 08/16/18  Last Refill: 03/12/17 *rx has expired  Requested Prescriptions  Pending Prescriptions Disp Refills   glucose blood (ACCU-CHEK AVIVA PLUS) test strip 100 each 0    Sig: Test blood sugar 2-3 times/ day.     Endocrinology: Diabetes - Testing Supplies Passed - 07/20/2018 12:58 PM      Passed - Valid encounter within last 12 months    Recent Outpatient Visits          6 days ago Encounter for preventive health examination   [facility] [name], MD   3 months ago Controlled type 2 DM with peripheral circulatory disorder [facility])   [place] [name], MD   10 months ago Bradycardia   [facility] [name], MD   1 year ago Diabetic polyneuropathy associated with type 2 diabetes mellitus [facility])   [place] [name], MD   1 year ago Anxiety state   [place] [name], MD      Future Appointments            In 3 weeks Derrel Nip, [name], MD [place], [place]   In 10 months O'Brien-Blaney, Bryson Corona, LPN [place], [place]   In 10 months Derrel Nip, [name], MD [facility], [place]

## 2018-07-21 ENCOUNTER — Other Ambulatory Visit: Payer: Medicare Other | Admitting: Urology

## 2018-07-21 DIAGNOSIS — I739 Peripheral vascular disease, unspecified: Secondary | ICD-10-CM | POA: Diagnosis not present

## 2018-07-21 DIAGNOSIS — Z794 Long term (current) use of insulin: Secondary | ICD-10-CM | POA: Diagnosis not present

## 2018-07-21 DIAGNOSIS — L97521 Non-pressure chronic ulcer of other part of left foot limited to breakdown of skin: Secondary | ICD-10-CM | POA: Diagnosis not present

## 2018-07-21 DIAGNOSIS — E1140 Type 2 diabetes mellitus with diabetic neuropathy, unspecified: Secondary | ICD-10-CM | POA: Diagnosis not present

## 2018-07-30 DIAGNOSIS — L97522 Non-pressure chronic ulcer of other part of left foot with fat layer exposed: Secondary | ICD-10-CM | POA: Diagnosis not present

## 2018-07-30 DIAGNOSIS — I739 Peripheral vascular disease, unspecified: Secondary | ICD-10-CM | POA: Diagnosis not present

## 2018-08-10 ENCOUNTER — Other Ambulatory Visit: Payer: Self-pay | Admitting: Internal Medicine

## 2018-08-11 ENCOUNTER — Other Ambulatory Visit: Payer: Medicare Other

## 2018-08-14 NOTE — Progress Notes (Signed)
° °  08/18/2018   CC:  Chief Complaint  Patient presents with   Cysto    HPI: Laura Reed is a 58 y.o. female with with history of pT1N0 left upper tract urothelial carcinoma s/p nephroU 4/016 with low grade Ta bladder recurrence s/p TURBT, mitomycin, right retrograde pyelogram on 2/18.    She returns today for routine f/u cystoscopy.    Patient denies urinary symptoms today.  1- Left Upper Tract Transitional Cell Carcinoma/ Bladder cancer recurrence- s/p LEFT robotic nephroureterectomy on 10/31/2014 as well as combined ventral hernia repair with Dr. Bary Castilla. Post complicated by wound infection now well healed. Pathology pT1N0 with negative margins.  Recent Surveillance: 02/2015 - cysto NED; 05/2015 cysto NED, 08/2015- cysto NED, 11/2015- NED, CT Urogram 11/12/15 negative, poor quality of delayed phase.; 5/2-17- cysto with mild erythema following recent UTI; 02/2016 NED; 08/2016 Lg Ta TCC recurrence, 1 cm, multifocal. RTG neg.  08/2016- TURBT for bladder 1 cm recurrence near bladder neck and left UO, multifocal. R RTG negative. Mitomycin. LgTa. 10/2016- Induction BCG x 6 08/2017- Maintenance BCG x 3  2 - Solitary Kidney - s/p left nephrectomy as per above. Most recent Cr 0.99 07/12/2018  She does have an extensive smoking history, quit 5years ago but smoked up to 2 packs a day for 35 years. She also has multiple medical comorbidities including history of diabetes, CAD status post CABG, carotid endarterectomy.   Blood pressure (!) 163/61, pulse 80, height 5\' 7"  (1.702 m), weight 160 lb (72.6 kg). NED. A&Ox3.   No respiratory distress   Abd soft, NT, ND Normal external genitalia with patent urethral meatus  Cystoscopy Procedure Note  Patient identification was confirmed, informed consent was obtained, and patient was prepped using Betadine solution.  Lidocaine jelly was administered per urethral meatus.    Preoperative abx where received prior to procedure.     Procedure: - Flexible cystoscope introduced, without any difficulty.   - Thorough search of the bladder revealed:    normal urethral meatus    -no evidence of papillary tumor.     no stones    no ulcers     no tumors    no urethral polyps    no trabeculation    Nonspecific diffuse patches of erythema with possible differential diagnosis including cystitis v. CIS, no obvious papillary lesion   - Right UO normal. Left UO was surgically absent.  Post-Procedure: - Patient tolerated the procedure well  Assessment/ Plan:  1. Malignant neoplasm of urinary bladder, unspecified site (HCC)  - Nonspecific diffuse patches of erythema with possible differential diagnosis including cystitis v. CIS, no obvious papillary lesion - Discussed options including close surveillance in 6 weeks and urine culture/cytology today v. Biopsy. Patient elected for close surveillance in 6 weeks. - Urine Culture and cytology sent - Patient would like to resume maintainance BCG following results of cysto in 6 weeks  Return in about 6 weeks (around 09/29/2018) for Cysto.   I, Adele Schilder, am acting as a scribe for Hollice Espy, MD.    I have reviewed the above documentation for accuracy and completeness, and I agree with the above.   Hollice Espy, MD

## 2018-08-16 ENCOUNTER — Ambulatory Visit: Payer: Medicare Other | Admitting: Internal Medicine

## 2018-08-17 ENCOUNTER — Other Ambulatory Visit: Payer: Self-pay

## 2018-08-17 MED ORDER — GLUCOSE BLOOD VI STRP: ORAL_STRIP | 1 refills | Status: DC

## 2018-08-18 ENCOUNTER — Ambulatory Visit (INDEPENDENT_AMBULATORY_CARE_PROVIDER_SITE_OTHER): Payer: Medicare Other | Admitting: Urology

## 2018-08-18 ENCOUNTER — Encounter: Payer: Self-pay | Admitting: Urology

## 2018-08-18 VITALS — BP 163/61 | HR 80 | Ht 67.0 in | Wt 160.0 lb

## 2018-08-18 DIAGNOSIS — R319 Hematuria, unspecified: Secondary | ICD-10-CM | POA: Diagnosis not present

## 2018-08-18 DIAGNOSIS — C679 Malignant neoplasm of bladder, unspecified: Secondary | ICD-10-CM

## 2018-08-18 DIAGNOSIS — N3000 Acute cystitis without hematuria: Secondary | ICD-10-CM | POA: Diagnosis not present

## 2018-08-18 LAB — URINALYSIS, COMPLETE
Bilirubin, UA: NEGATIVE
Glucose, UA: NEGATIVE
Ketones, UA: NEGATIVE
Nitrite, UA: NEGATIVE
Specific Gravity, UA: 1.015 (ref 1.005–1.030)
Urobilinogen, Ur: 0.2 mg/dL (ref 0.2–1.0)
pH, UA: 7 (ref 5.0–7.5)

## 2018-08-18 LAB — MICROSCOPIC EXAMINATION: RBC, UA: NONE SEEN /hpf (ref 0–2)

## 2018-08-20 DIAGNOSIS — L97522 Non-pressure chronic ulcer of other part of left foot with fat layer exposed: Secondary | ICD-10-CM | POA: Diagnosis not present

## 2018-08-21 LAB — CULTURE, URINE COMPREHENSIVE

## 2018-08-24 IMAGING — US US THYROID
1 series · 13 of 25 positions shown · non-contrast
Comparison: 03/19/2016

CLINICAL DATA: Nodule

EXAM:
THYROID ULTRASOUND
TECHNIQUE: Ultrasound examination of the thyroid gland and adjacent soft
tissues was performed.

[Series 1: us thyroid · 0.07mm/px · 13 of 53 slices shown]
[im 1/53]
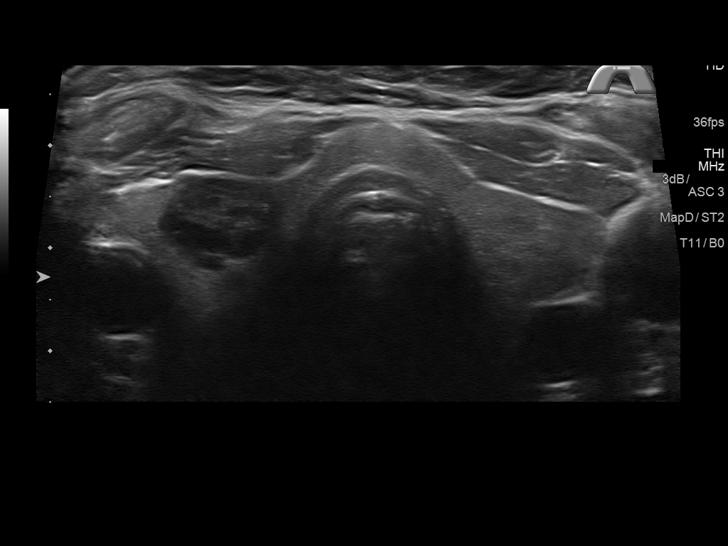
[im 5/53]
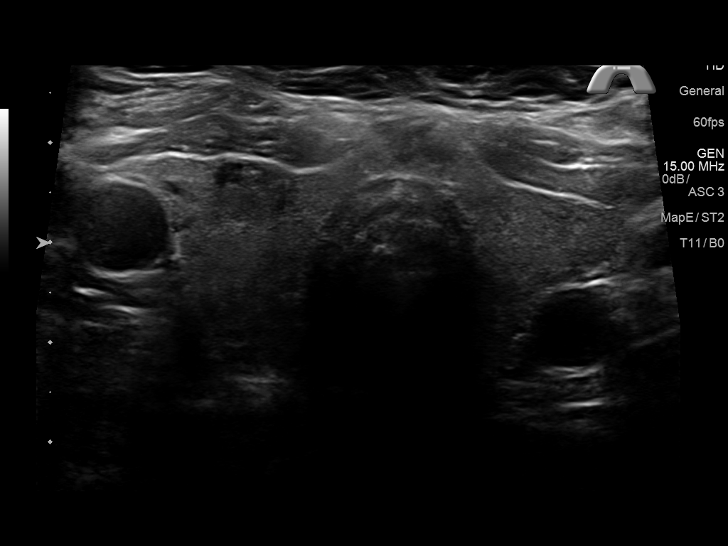
[im 9/53]
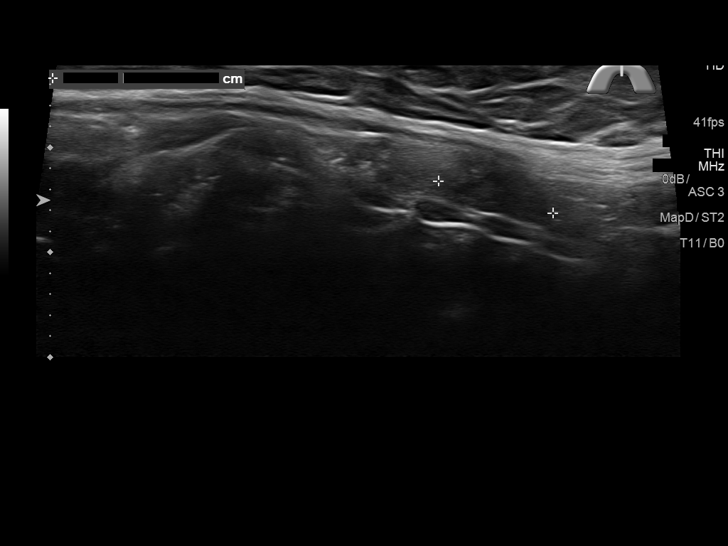
[im 14/53]
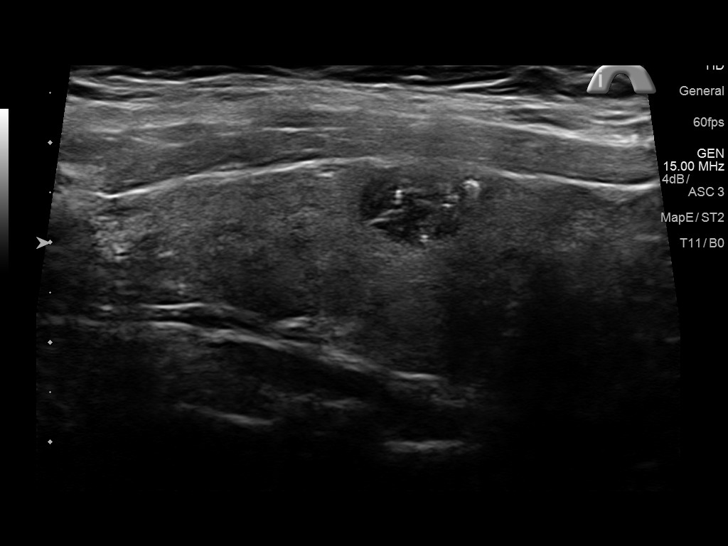
[im 18/53]
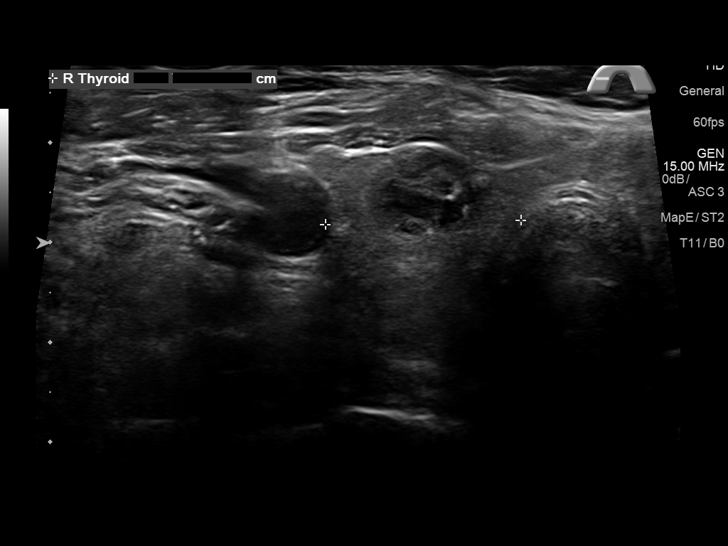
[im 22/53]
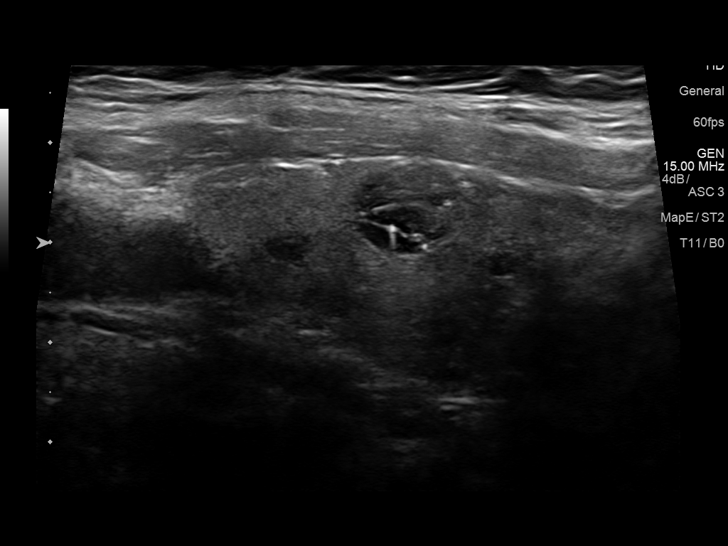
[im 27/53]
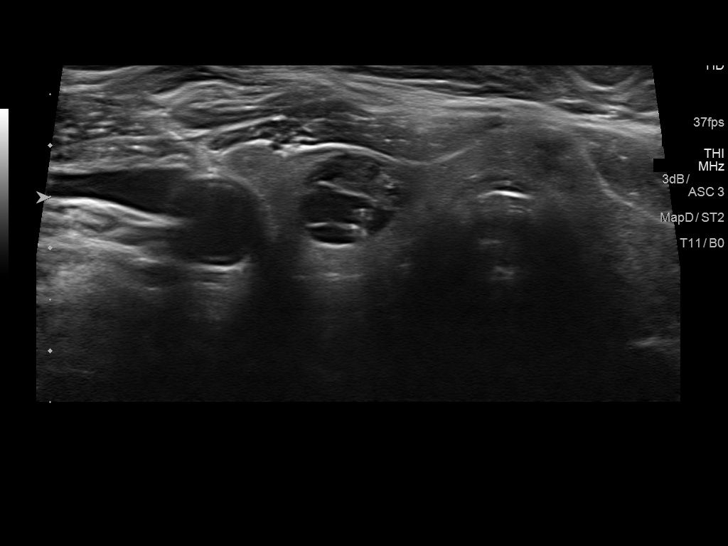
[im 31/53]
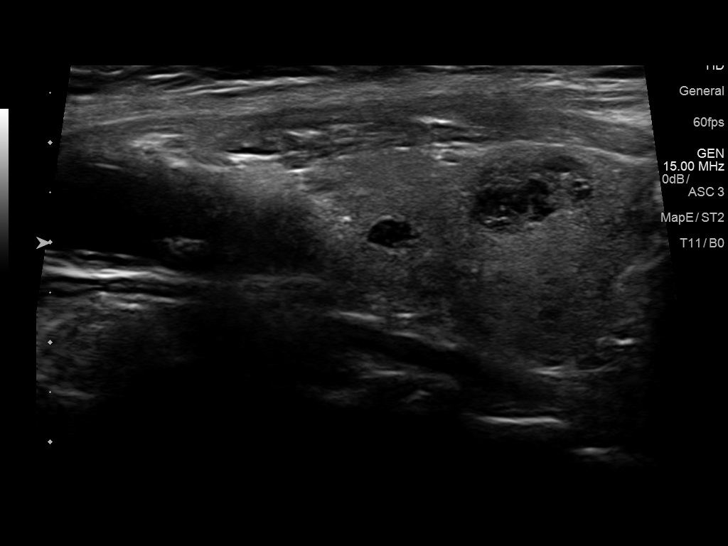
[im 35/53]
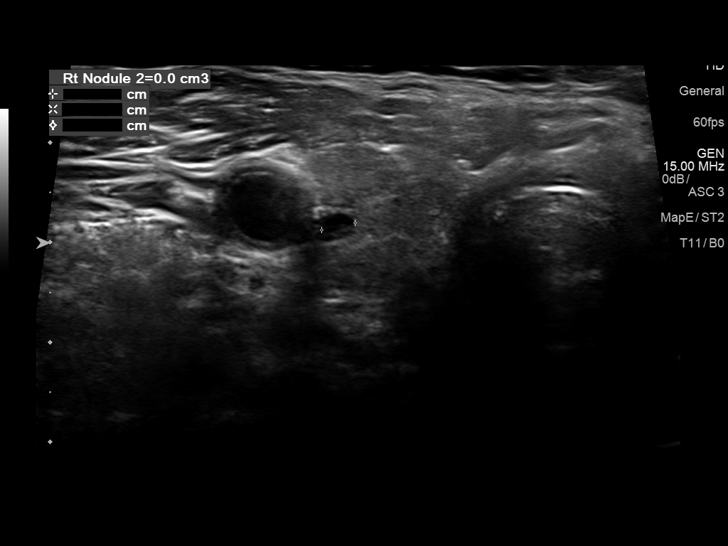
[im 40/53]
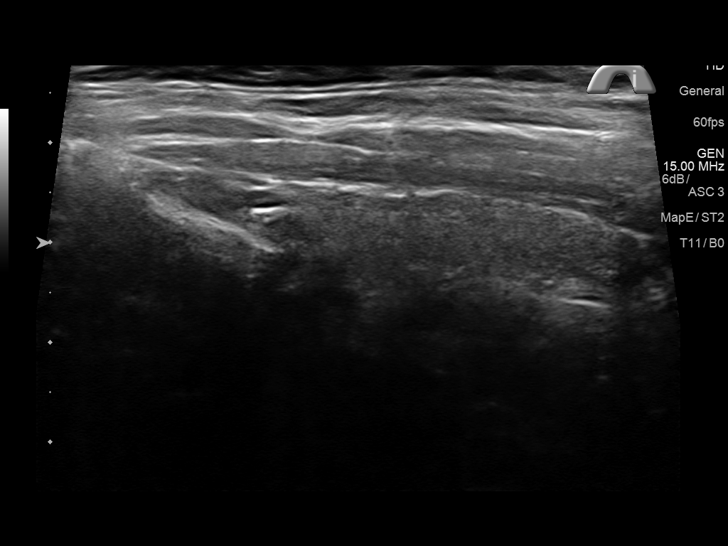
[im 44/53]
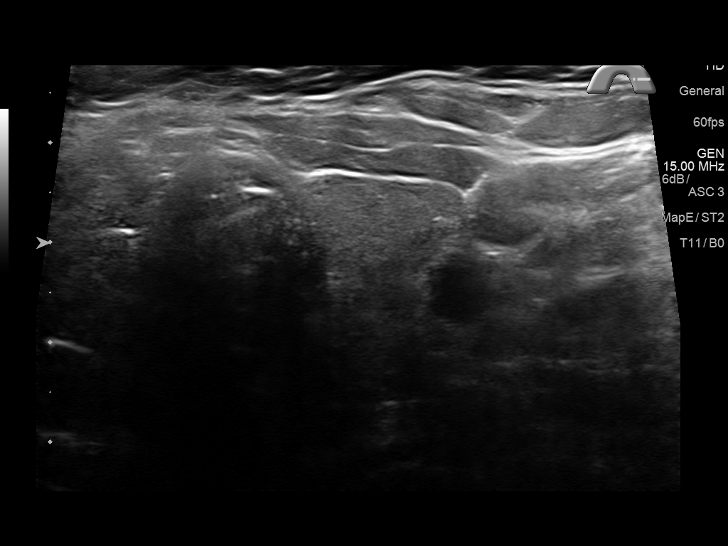
[im 48/53]
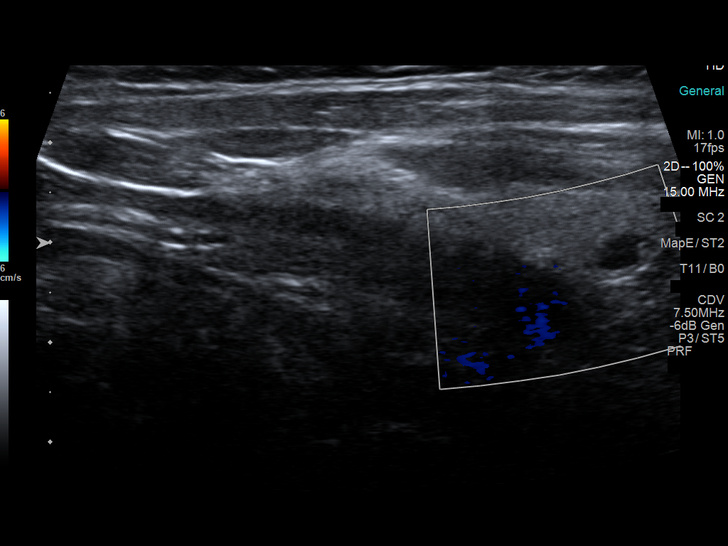
[im 53/53]
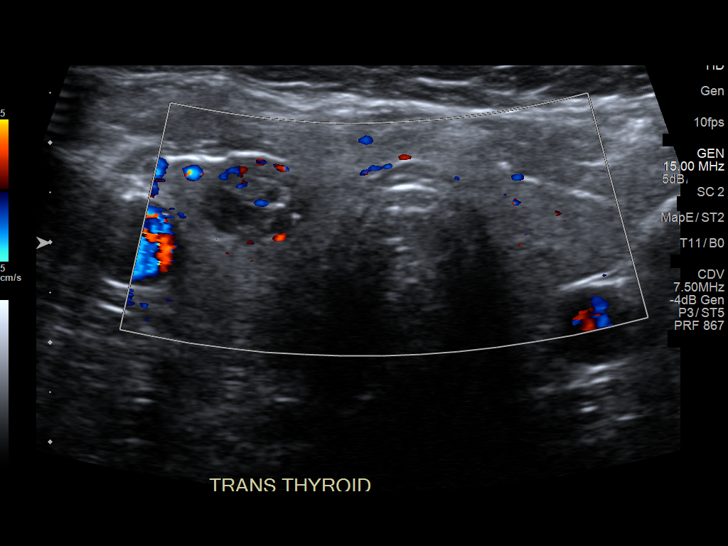

[13 of 25 positions shown; findings below may reference images not displayed]

FINDINGS: Parenchymal Echotexture: Mildly heterogenous

Isthmus: 0.4 cm thickness, previously

Right lobe: 4.8 x 2.2 x 2 cm (previously 4.2 x 2.6 x 2.6)

Left lobe: 4.9 x 1.9 x 1.5 cm (previously 4.8 x 1.8 x 1.9)

_________________________________________________________

Estimated total number of nodules >/= 1 cm: 2

Number of spongiform nodules >/=  2 cm not described below (TR1): 0

Number of mixed cystic and solid nodules >/= 1.5 cm not described
below (TR2): 0

_________________________________________________________

Nodule # 1:

Location: Isthmus; left of midline

Maximum size: 1.1 cm; Other 2 dimensions: 0.9 x 0.7 cm, previously 1
x 0.6 x 0.6 cm

Composition: solid/almost completely solid (2)

Echogenicity: hypoechoic (2)

Shape: not taller-than-wide (0).

Margins: ill-defined (0)

Echogenic foci: none (0)

ACR TI-RADS total points: 4.

ACR TI-RADS risk category: TR4 (4-6 points).

ACR TI-RADS recommendations:

*Given size (>/= 1 - 1.4 cm) and appearance, a follow-up ultrasound
in 1 year should be considered based on TI-RADS criteria.

_________________________________________________________

Nodule # 2:

Location: Right; Mid

Maximum size: 1.4 cm; Other 2 dimensions: 0.9 x 1.3 cm, previously
1.9 x 1.7 x 1.1 cm

Composition: mixed cystic and solid (1)

Echogenicity: hypoechoic (2)

Shape: not taller-than-wide (0)

Margins: smooth (0)

Echogenic foci: macrocalcifications (1)

ACR TI-RADS total points: 4.

ACR TI-RADS risk category: TR4 (4-6 points).

ACR TI-RADS recommendations:

*Given size (>/= 1 - 1.4 cm) and appearance, a follow-up ultrasound
in 1 year should be considered based on TI-RADS criteria.

_________________________________________________________

0.5 cm cyst, superior right

0.4 cm cyst, inferior left
IMPRESSION: 1. Mild thyromegaly with right and isthmic nodules. None meets
criteria for biopsy.
Recommend 1 year follow-up ultrasound.

The above is in keeping with the ACR TI-RADS recommendations - [HOSPITAL] 4079;[DATE].

## 2018-08-25 ENCOUNTER — Other Ambulatory Visit: Payer: Self-pay | Admitting: Urology

## 2018-08-25 ENCOUNTER — Other Ambulatory Visit: Payer: Medicare Other | Admitting: Urology

## 2018-09-03 ENCOUNTER — Other Ambulatory Visit: Payer: Self-pay | Admitting: Internal Medicine

## 2018-09-06 ENCOUNTER — Ambulatory Visit (INDEPENDENT_AMBULATORY_CARE_PROVIDER_SITE_OTHER): Payer: Medicare Other

## 2018-09-06 ENCOUNTER — Ambulatory Visit (INDEPENDENT_AMBULATORY_CARE_PROVIDER_SITE_OTHER): Payer: Medicare Other | Admitting: Vascular Surgery

## 2018-09-06 ENCOUNTER — Other Ambulatory Visit: Payer: Self-pay

## 2018-09-06 ENCOUNTER — Encounter (INDEPENDENT_AMBULATORY_CARE_PROVIDER_SITE_OTHER): Payer: Self-pay | Admitting: Vascular Surgery

## 2018-09-06 VITALS — BP 124/81 | HR 81 | Resp 16 | Ht 67.0 in | Wt 161.0 lb

## 2018-09-06 DIAGNOSIS — Z95820 Peripheral vascular angioplasty status with implants and grafts: Secondary | ICD-10-CM

## 2018-09-06 DIAGNOSIS — I70213 Atherosclerosis of native arteries of extremities with intermittent claudication, bilateral legs: Secondary | ICD-10-CM

## 2018-09-06 DIAGNOSIS — Z87891 Personal history of nicotine dependence: Secondary | ICD-10-CM

## 2018-09-06 DIAGNOSIS — I739 Peripheral vascular disease, unspecified: Secondary | ICD-10-CM

## 2018-09-06 DIAGNOSIS — I10 Essential (primary) hypertension: Secondary | ICD-10-CM

## 2018-09-06 DIAGNOSIS — I25118 Atherosclerotic heart disease of native coronary artery with other forms of angina pectoris: Secondary | ICD-10-CM | POA: Diagnosis not present

## 2018-09-06 DIAGNOSIS — Z79899 Other long term (current) drug therapy: Secondary | ICD-10-CM

## 2018-09-06 DIAGNOSIS — I6523 Occlusion and stenosis of bilateral carotid arteries: Secondary | ICD-10-CM | POA: Diagnosis not present

## 2018-09-06 DIAGNOSIS — E1151 Type 2 diabetes mellitus with diabetic peripheral angiopathy without gangrene: Secondary | ICD-10-CM

## 2018-09-06 DIAGNOSIS — Z7984 Long term (current) use of oral hypoglycemic drugs: Secondary | ICD-10-CM

## 2018-09-06 NOTE — Progress Notes (Signed)
MRN : [id number]  Laura Reed is a 58 y.o. (11/07/1960) female who presents with chief complaint of leg pain with multiple treatments/stents.  History of Present Illness:   The patient returns to the office for followup and review status post angiogram with intervention.   She is s/p angiogram on 05/05/2018: 1.  Percutaneous transluminal angioplasty and stent placementleftpopliteal 2. Percutaneous transluminal angioplasty of the right anterior tibial artery to 3 mm  The patient notes improvement in the lower extremity symptoms. No interval shortening of the patient's claudication distance or rest pain symptoms. Previous wounds have now healed.  No new ulcers or wounds have occurred since the last visit.  There have been no significant changes to the patient's overall health care.  The patient denies amaurosis fugax or recent TIA symptoms. There are no recent neurological changes noted. The patient denies history of DVT, PE or superficial thrombophlebitis. The patient denies recent episodes of angina or shortness of breath.   ABI's Rt= 0.81 and Lt= 0.82 (previous Rt= 0.73 and Lt= 0.72) Duplex ultrasound of the bilateral lower extremities shows patent bypasses bilaterally no hemodynamically significant stenosis  No outpatient medications have been marked as taking for the 09/06/18 encounter (Appointment) with Delana Meyer, Dolores Lory, MD.    Past Medical History:  Diagnosis Date  . Anxiety   . Bladder cancer ([place])   . CHF (congestive heart failure) ([place])   . Coronary artery disease   . Diabetes mellitus   . Heart murmur   . Hemorrhoid   . Hypertension   . Neuropathy   . PVD (peripheral vascular disease) ([place])   . Thyroid nodule    right  . Urothelial carcinoma of kidney ([place]) 10/31/2014   INVASIVE UROTHELIAL CARCINOMA, LOW GRADE. T1, Nx.    Past Surgical History:  Procedure Laterality Date  . AMPUTATION TOE     right (4th and 5th); left (great toe, 3rd)  .  AMPUTATION TOE Right 07/16/2018   Procedure: AMPUTATION TOE/MPJ right 2nd;  Surgeon: Sharlotte Alamo, DPM;  Location: [facility] OR[facility];  Service: Podiatry;  Laterality: Right;  . ARTERIAL BYPASS SURGRY  2009, 2013 x 2   right leg , done in [place]  . CARDIAC CATHETERIZATION    . CAROTID ENDARTERECTOMY Right 01/2014   Dr Delana Meyer  . CATARACT EXTRACTION W/PHACO Right 12/14/2014   Procedure: CATARACT EXTRACTION PHACO AND INTRAOCULAR LENS PLACEMENT (IOC);  Surgeon: Lyla Glassing, MD;  Location: [facility];  Service: Ophthalmology;  Laterality: Right;  [place]   00:38.6              AP        7.1                   CDE  2.76  . CESAREAN SECTION    . CHOLECYSTECTOMY  03-03-12   Porcelain gallbladder, gallstones,  [place]  . COLONOSCOPY W/ BIOPSIES  04/28/2012   Hyperplastic rectal polyps.  . CORONARY ARTERY BYPASS GRAFT  2009   3 vessel  . CYSTOSCOPY W/ RETROGRADES Right 09/01/2016   Procedure: CYSTOSCOPY WITH RETROGRADE PYELOGRAM;  Surgeon: Hollice Espy, MD;  Location: [facility];  Service: Urology;  Laterality: Right;  . EYE SURGERY    . HERNIA REPAIR  10-31-14   ventral, retro-rectus atrium mesh  . LOWER EXTREMITY ANGIOGRAPHY Left 12/10/2016   Procedure: Lower Extremity Angiography;  Surgeon: Katha Cabal, MD;  Location: [place] [facility];  Service: Cardiovascular;  Laterality: Left;  . LOWER EXTREMITY ANGIOGRAPHY Left 02/02/2018  Procedure: LOWER EXTREMITY ANGIOGRAPHY;  Surgeon: Katha Cabal, MD;  Location: [place] [facility];  Service: Cardiovascular;  Laterality: Left;  . LOWER EXTREMITY ANGIOGRAPHY Left 05/05/2018   Procedure: LOWER EXTREMITY ANGIOGRAPHY;  Surgeon: Katha Cabal, MD;  Location: [place] [facility];  Service: Cardiovascular;  Laterality: Left;  . NEPHRECTOMY Left 10-31-14  . PERIPHERAL VASCULAR CATHETERIZATION Left 05/01/2015   Procedure: Lower Extremity Angiography;  Surgeon: Katha Cabal, MD;  Location: [place] [facility];  Service: Cardiovascular;  Laterality: Left;   . PERIPHERAL VASCULAR CATHETERIZATION  05/01/2015   Procedure: Lower Extremity Intervention;  Surgeon: Katha Cabal, MD;  Location: [place] [facility];  Service: Cardiovascular;;  . PERIPHERAL VASCULAR CATHETERIZATION Left 02/20/2015   Procedure: Pelvic Angiography;  Surgeon: Katha Cabal, MD;  Location: [place] [facility];  Service: Cardiovascular;  Laterality: Left;  . TRANSURETHRAL RESECTION OF BLADDER TUMOR WITH MITOMYCIN-C N/A 09/01/2016   Procedure: TRANSURETHRAL RESECTION OF BLADDER TUMOR WITH MITOMYCIN-C;  Surgeon: Hollice Espy, MD;  Location: [facility];  Service: Urology;  Laterality: N/A;    Social History Social History   Tobacco Use  . Smoking status: Former Smoker    Packs/day: 2.00    Years: 35.00    Pack years: 70.00    Types: Cigarettes    Last attempt to quit: 03/30/2011    Years since quitting: 7.4  . Smokeless tobacco: Never Used  Substance Use Topics  . Alcohol use: No    Alcohol/week: 0.0 standard drinks  . Drug use: Not Currently    Types: Cocaine    Comment: last used in 2007    Family History Family History  Problem Relation Age of Onset  . Cancer Mother 51       Lung Cancer  . Cancer Father 49       Lung Ca  . Diabetes Son   . Breast cancer Maternal Grandmother   . Kidney cancer Neg Hx   . Bladder Cancer Neg Hx   . Prostate cancer Neg Hx     No Known Allergies   REVIEW OF SYSTEMS (Negative unless checked)  Constitutional: [] Weight loss  [] Fever  [] Chills Cardiac: [] Chest pain   [] Chest pressure   [] Palpitations   [] Shortness of breath when laying flat   [] Shortness of breath with exertion. Vascular:  [x] Pain in legs with walking   [] Pain in legs at rest  [] History of DVT   [] Phlebitis   [] Swelling in legs   [] Varicose veins   [] Non-healing ulcers Pulmonary:   [] Uses home oxygen   [] Productive cough   [] Hemoptysis   [] Wheeze  [] COPD   [] Asthma Neurologic:  [] Dizziness   [] Seizures   [] History of stroke   [] History of TIA   [] Aphasia   [] Vissual changes   [] Weakness or numbness in arm   [] Weakness or numbness in leg Musculoskeletal:   [] Joint swelling   [x] Joint pain   [x] Low back pain Hematologic:  [] Easy bruising  [] Easy bleeding   [] Hypercoagulable state   [] Anemic Gastrointestinal:  [] Diarrhea   [] Vomiting  [] Gastroesophageal reflux/heartburn   [] Difficulty swallowing. Genitourinary:  [] Chronic kidney disease   [] Difficult urination  [] Frequent urination   [] Blood in urine Skin:  [] Rashes   [] Ulcers  Psychological:  [] History of anxiety   []  History of major depression.  Physical Examination  There were no vitals filed for this visit. There is no height or weight on file to calculate BMI. Gen: WD/WN, NAD Head: [place]/AT, No temporalis wasting.  Ear/Nose/Throat: Hearing  grossly intact, nares w/o erythema or drainage Eyes: PER, EOMI, sclera nonicteric.  Neck: Supple, no large masses.   Pulmonary:  Good air movement, no audible wheezing bilaterally, no use of accessory muscles.  Cardiac: RRR, no JVD Vascular:  Multiple well helaed scars from previous surgeries, brisk cap refill no ulcers, bilateral carotid bruits Vessel Right Left  Radial Palpable Palpable  Brachial Palpable Palpable  Carotid Palpable Palpable  PT Trace Palpable Trace Palpable  DP Not Palpable Not Palpable  Gastrointestinal: Non-distended. No guarding/no peritoneal signs.  Musculoskeletal: M/S 5/5 throughout.  No deformity or atrophy.  Neurologic: CN 2-12 intact. Symmetrical.  Speech is fluent. Motor exam as listed above. Psychiatric: Judgment intact, Mood & affect appropriate for pt's clinical situation. Dermatologic: No rashes or ulcers noted.  No changes consistent with cellulitis. Lymph : No lichenification or skin changes of chronic lymphedema.  CBC Lab Results  Component Value Date   WBC 9.1 07/12/2018   HGB 12.6 07/12/2018   HCT 37.6 07/12/2018   MCV 85.5 07/12/2018   PLT 240 07/12/2018    BMET    Component Value  Date/Time   NA 136 07/12/2018 1339   NA 135 11/02/2014 0609   K 4.0 07/12/2018 1339   K 4.2 11/02/2014 0609   CL 103 07/12/2018 1339   CL 107 11/02/2014 0609   CO2 24 07/12/2018 1339   CO2 23 11/02/2014 0609   GLUCOSE 130 (H) 07/12/2018 1339   GLUCOSE 108 (H) 11/02/2014 0609   BUN 32 (H) 07/12/2018 1339   BUN 20 11/02/2014 0609   CREATININE 0.99 07/12/2018 1339   CREATININE 1.01 11/09/2015 1549   CREATININE 1.01 11/09/2015 1549   CALCIUM 8.8 (L) 07/12/2018 1339   CALCIUM 7.3 (L) 11/02/2014 0609   GFRNONAA >60 07/12/2018 1339   GFRNONAA 50 (L) 11/02/2014 0609   GFRAA >60 07/12/2018 1339   GFRAA 58 (L) 11/02/2014 0609   CrCl cannot be calculated (Patient's most recent lab result is older than the maximum 21 days allowed.).  COAG Lab Results  Component Value Date   INR 0.9 10/17/2014   INR 1.1 01/19/2014   INR 0.9 12/06/2013    Radiology No results found.   Assessment/Plan 1. Atherosclerosis of native artery of both lower extremities with intermittent claudication (HCC) Recommend:  The patient is status post successful angiogram with intervention.  The patient reports that the claudication symptoms and leg pain is essentially gone.   The patient denies lifestyle limiting changes at this point in time.  No further invasive studies, angiography or surgery at this time The patient should continue walking and begin a more formal exercise program.  The patient should continue antiplatelet therapy and aggressive treatment of the lipid abnormalities  Smoking cessation was again discussed  The patient should continue wearing graduated compression socks 10-15 mmHg strength to control the mild edema.  Patient should undergo noninvasive studies as ordered. The patient will follow up with me after the studies.   - VAS [place] LOWER EXTREMITY ARTERIAL DUPLEX; Future - VAS [place] ABI WITH/WO TBI; Future  2. Bilateral carotid artery stenosis Recommend:  Given the patient's  asymptomatic subcritical stenosis no further invasive testing or surgery at this time.  Duplex ultrasound shows less than 50% stenosis bilaterally.  Continue antiplatelet therapy as prescribed Continue management of CAD, HTN and Hyperlipidemia Healthy heart diet,  encouraged exercise at least 4 times per week  Follow up in 6 months with duplex ultrasound and physical exam   - VAS US CAROTID; Future  3. Coronary artery disease of native artery of native heart with stable angina pectoris (HCC) Continue cardiac and antihypertensive medications as already ordered and reviewed, no changes at this time.  Continue statin as ordered and reviewed, no changes at this time  Nitrates PRN for chest pain  4. Controlled type 2 DM with peripheral circulatory disorder (HCC) Continue hypoglycemic medications as already ordered, these medications have been reviewed and there are no changes at this time.  Hgb A1C to be monitored as already arranged by primary service  5. Essential hypertension Continue antihypertensive medications as already ordered, these medications have been reviewed and there are no changes at this time.    Hortencia Pilar, MD  09/06/2018 9:59 AM

## 2018-09-10 DIAGNOSIS — L97522 Non-pressure chronic ulcer of other part of left foot with fat layer exposed: Secondary | ICD-10-CM | POA: Diagnosis not present

## 2018-09-10 DIAGNOSIS — I739 Peripheral vascular disease, unspecified: Secondary | ICD-10-CM | POA: Diagnosis not present

## 2018-09-12 ENCOUNTER — Other Ambulatory Visit: Payer: Self-pay | Admitting: Internal Medicine

## 2018-09-21 ENCOUNTER — Other Ambulatory Visit: Payer: Self-pay | Admitting: Internal Medicine

## 2018-09-22 NOTE — Telephone Encounter (Signed)
Refilled: 02/17/2018 Last OV: 07/14/2018 Next OV: 10/12/2018

## 2018-09-28 ENCOUNTER — Other Ambulatory Visit: Payer: Medicare Other | Admitting: Urology

## 2018-10-07 ENCOUNTER — Other Ambulatory Visit: Payer: Medicare Other

## 2018-10-08 DIAGNOSIS — L97522 Non-pressure chronic ulcer of other part of left foot with fat layer exposed: Secondary | ICD-10-CM | POA: Diagnosis not present

## 2018-10-12 ENCOUNTER — Ambulatory Visit: Payer: Medicare Other | Admitting: Internal Medicine

## 2018-11-03 ENCOUNTER — Other Ambulatory Visit: Payer: Medicare Other | Admitting: Urology

## 2018-11-08 DIAGNOSIS — Z794 Long term (current) use of insulin: Secondary | ICD-10-CM | POA: Diagnosis not present

## 2018-11-08 DIAGNOSIS — I739 Peripheral vascular disease, unspecified: Secondary | ICD-10-CM | POA: Diagnosis not present

## 2018-11-08 DIAGNOSIS — L97522 Non-pressure chronic ulcer of other part of left foot with fat layer exposed: Secondary | ICD-10-CM | POA: Diagnosis not present

## 2018-11-08 DIAGNOSIS — E1140 Type 2 diabetes mellitus with diabetic neuropathy, unspecified: Secondary | ICD-10-CM | POA: Diagnosis not present

## 2018-11-08 DIAGNOSIS — B351 Tinea unguium: Secondary | ICD-10-CM | POA: Diagnosis not present

## 2018-11-24 ENCOUNTER — Telehealth: Payer: Self-pay | Admitting: Internal Medicine

## 2018-11-24 NOTE — Telephone Encounter (Signed)
Copied from [place] [phone]. Topic: Quick Communication - Rx Refill/Question >> Nov 24, 2018 12:38 PM Burchel, Abbi R wrote: Medication: B-D ULTRAFINE III SHORT PEN 31G X 8 MM MISC  Preferred Pharmacy: [facility] #[id number] Laura Reed, [place] [phone] (Fax)  Pt was advised that RX refills may take up to 3 business days. We ask that you follow-up with your pharmacy.

## 2018-11-26 MED ORDER — INSULIN PEN NEEDLE 31G X 8 MM MISC: 2 refills | Status: DC

## 2018-11-26 NOTE — Telephone Encounter (Signed)
Needles have been refilled

## 2018-12-01 ENCOUNTER — Other Ambulatory Visit: Payer: Self-pay

## 2018-12-01 ENCOUNTER — Other Ambulatory Visit: Payer: Medicare Other | Admitting: Urology

## 2018-12-03 ENCOUNTER — Other Ambulatory Visit: Payer: Self-pay

## 2018-12-03 ENCOUNTER — Other Ambulatory Visit (INDEPENDENT_AMBULATORY_CARE_PROVIDER_SITE_OTHER): Payer: Medicare Other

## 2018-12-03 DIAGNOSIS — E1151 Type 2 diabetes mellitus with diabetic peripheral angiopathy without gangrene: Secondary | ICD-10-CM

## 2018-12-03 LAB — COMPREHENSIVE METABOLIC PANEL
ALT: 13 U/L (ref 0–35)
AST: 13 U/L (ref 0–37)
Albumin: 3.8 g/dL (ref 3.5–5.2)
Alkaline Phosphatase: 93 U/L (ref 39–117)
BUN: 30 mg/dL — ABNORMAL HIGH (ref 6–23)
CO2: 28 mEq/L (ref 19–32)
Calcium: 8.8 mg/dL (ref 8.4–10.5)
Chloride: 103 mEq/L (ref 96–112)
Creatinine, Ser: 1.01 mg/dL (ref 0.40–1.20)
GFR: 55.87 mL/min — ABNORMAL LOW (ref >60.00)
Glucose, Bld: 91 mg/dL (ref 70–99)
Potassium: 4.8 mEq/L (ref 3.5–5.1)
Sodium: 138 mEq/L (ref 135–145)
Total Bilirubin: 0.3 mg/dL (ref 0.2–1.2)
Total Protein: 6.6 g/dL (ref 6.0–8.3)

## 2018-12-03 LAB — LIPID PANEL
Cholesterol: 110 mg/dL (ref 0–200)
HDL: 43.1 mg/dL (ref >39.00)
LDL Cholesterol: 43 mg/dL (ref 0–99)
NonHDL: 66.8
Total CHOL/HDL Ratio: 3
Triglycerides: 120 mg/dL (ref 0.0–149.0)
VLDL: 24 mg/dL (ref 0.0–40.0)

## 2018-12-03 LAB — HEMOGLOBIN A1C: Hgb A1c MFr Bld: 7.4 % — ABNORMAL HIGH (ref 4.6–6.5)

## 2018-12-06 ENCOUNTER — Encounter: Payer: Self-pay | Admitting: Internal Medicine

## 2018-12-06 ENCOUNTER — Ambulatory Visit (INDEPENDENT_AMBULATORY_CARE_PROVIDER_SITE_OTHER): Payer: Medicare Other | Admitting: Internal Medicine

## 2018-12-06 ENCOUNTER — Other Ambulatory Visit: Payer: Self-pay

## 2018-12-06 ENCOUNTER — Other Ambulatory Visit (HOSPITAL_COMMUNITY)
Admission: RE | Admit: 2018-12-06 | Discharge: 2018-12-06 | Disposition: A | Discharge status: Home / Self Care | Payer: Medicare Other | Source: Ambulatory Visit | Attending: Internal Medicine | Admitting: Internal Medicine

## 2018-12-06 VITALS — BP 148/62 | HR 79 | Temp 98.3°F | Resp 15 | Ht 67.0 in | Wt 166.8 lb

## 2018-12-06 DIAGNOSIS — Z124 Encounter for screening for malignant neoplasm of cervix: Secondary | ICD-10-CM

## 2018-12-06 DIAGNOSIS — E1142 Type 2 diabetes mellitus with diabetic polyneuropathy: Secondary | ICD-10-CM | POA: Diagnosis not present

## 2018-12-06 DIAGNOSIS — Z1239 Encounter for other screening for malignant neoplasm of breast: Secondary | ICD-10-CM

## 2018-12-06 DIAGNOSIS — Z0000 Encounter for general adult medical examination without abnormal findings: Secondary | ICD-10-CM

## 2018-12-06 NOTE — Patient Instructions (Signed)
Good to see you!  I';ll have the results of your PA in 3 to 5 days  See you in 4 months for follow up on diabetes  Health Maintenance for Postmenopausal Women Menopause is a normal process in which your reproductive ability comes to an end. This process happens gradually over a span of months to years, usually between the ages of 97 and 46. Menopause is complete when you have missed 12 consecutive menstrual periods. It is important to talk with your health care provider about some of the most common conditions that affect postmenopausal women, such as heart disease, cancer, and bone loss (osteoporosis). Adopting a healthy lifestyle and getting preventive care can help to promote your health and wellness. Those actions can also lower your chances of developing some of these common conditions. What should I know about menopause? During menopause, you may experience a number of symptoms, such as:  Moderate-to-severe hot flashes.  Night sweats.  Decrease in sex drive.  Mood swings.  Headaches.  Tiredness.  Irritability.  Memory problems.  Insomnia. Choosing to treat or not to treat menopausal changes is an individual decision that you make with your health care provider. What should I know about hormone replacement therapy and supplements? Hormone therapy products are effective for treating symptoms that are associated with menopause, such as hot flashes and night sweats. Hormone replacement carries certain risks, especially as you become older. If you are thinking about using estrogen or estrogen with progestin treatments, discuss the benefits and risks with your health care provider. What should I know about heart disease and stroke? Heart disease, heart attack, and stroke become more likely as you age. This may be due, in part, to the hormonal changes that your body experiences during menopause. These can affect how your body processes dietary fats, triglycerides, and cholesterol.  Heart attack and stroke are both medical emergencies. There are many things that you can do to help prevent heart disease and stroke:  Have your blood pressure checked at least every 1-2 years. High blood pressure causes heart disease and increases the risk of stroke.  If you are 14-38 years old, ask your health care provider if you should take aspirin to prevent a heart attack or a stroke.  Do not use any tobacco products, including cigarettes, chewing tobacco, or electronic cigarettes. If you need help quitting, ask your health care provider.  It is important to eat a healthy diet and maintain a healthy weight. ? Be sure to include plenty of vegetables, fruits, low-fat dairy products, and lean protein. ? Avoid eating foods that are high in solid fats, added sugars, or salt (sodium).  Get regular exercise. This is one of the most important things that you can do for your health. ? Try to exercise for at least 150 minutes each week. The type of exercise that you do should increase your heart rate and make you sweat. This is known as moderate-intensity exercise. ? Try to do strengthening exercises at least twice each week. Do these in addition to the moderate-intensity exercise.  Know your numbers.Ask your health care provider to check your cholesterol and your blood glucose. Continue to have your blood tested as directed by your health care provider.  What should I know about cancer screening? There are several types of cancer. Take the following steps to reduce your risk and to catch any cancer development as early as possible. Breast Cancer  Practice breast self-awareness. ? This means understanding how your breasts normally appear  and feel. ? It also means doing regular breast self-exams. Let your health care provider know about any changes, no matter how small.  If you are 22 or older, have a clinician do a breast exam (clinical breast exam or CBE) every year. Depending on your age,  family history, and medical history, it may be recommended that you also have a yearly breast X-ray (mammogram).  If you have a family history of breast cancer, talk with your health care provider about genetic screening.  If you are at high risk for breast cancer, talk with your health care provider about having an MRI and a mammogram every year.  Breast cancer (BRCA) gene test is recommended for women who have family members with BRCA-related cancers. Results of the assessment will determine the need for genetic counseling and BRCA1 and for BRCA2 testing. BRCA-related cancers include these types: ? Breast. This occurs in males or females. ? Ovarian. ? Tubal. This may also be called fallopian tube cancer. ? Cancer of the abdominal or pelvic lining (peritoneal cancer). ? Prostate. ? Pancreatic. Cervical, Uterine, and Ovarian Cancer Your health care provider may recommend that you be screened regularly for cancer of the pelvic organs. These include your ovaries, uterus, and vagina. This screening involves a pelvic exam, which includes checking for microscopic changes to the surface of your cervix (Pap test).  For women ages 21-65, health care providers may recommend a pelvic exam and a Pap test every three years. For women ages 15-65, they may recommend the Pap test and pelvic exam, combined with testing for human papilloma virus (HPV), every five years. Some types of HPV increase your risk of cervical cancer. Testing for HPV may also be done on women of any age who have unclear Pap test results.  Other health care providers may not recommend any screening for nonpregnant women who are considered low risk for pelvic cancer and have no symptoms. Ask your health care provider if a screening pelvic exam is right for you.  If you have had past treatment for cervical cancer or a condition that could lead to cancer, you need Pap tests and screening for cancer for at least 20 years after your treatment.  If Pap tests have been discontinued for you, your risk factors (such as having a new sexual partner) need to be reassessed to determine if you should start having screenings again. Some women have medical problems that increase the chance of getting cervical cancer. In these cases, your health care provider may recommend that you have screening and Pap tests more often.  If you have a family history of uterine cancer or ovarian cancer, talk with your health care provider about genetic screening.  If you have vaginal bleeding after reaching menopause, tell your health care provider.  There are currently no reliable tests available to screen for ovarian cancer. Lung Cancer Lung cancer screening is recommended for adults 8-66 years old who are at high risk for lung cancer because of a history of smoking. A yearly low-dose CT scan of the lungs is recommended if you:  Currently smoke.  Have a history of at least 30 pack-years of smoking and you currently smoke or have quit within the past 15 years. A pack-year is smoking an average of one pack of cigarettes per day for one year. Yearly screening should:  Continue until it has been 15 years since you quit.  Stop if you develop a health problem that would prevent you from having lung cancer  treatment. Colorectal Cancer  This type of cancer can be detected and can often be prevented.  Routine colorectal cancer screening usually begins at age 28 and continues through age 76.  If you have risk factors for colon cancer, your health care provider may recommend that you be screened at an earlier age.  If you have a family history of colorectal cancer, talk with your health care provider about genetic screening.  Your health care provider may also recommend using home test kits to check for hidden blood in your stool.  A small camera at the end of a tube can be used to examine your colon directly (sigmoidoscopy or colonoscopy). This is done to check  for the earliest forms of colorectal cancer.  Direct examination of the colon should be repeated every 5-10 years until age 12. However, if early forms of precancerous polyps or small growths are found or if you have a family history or genetic risk for colorectal cancer, you may need to be screened more often. Skin Cancer  Check your skin from head to toe regularly.  Monitor any moles. Be sure to tell your health care provider: ? About any new moles or changes in moles, especially if there is a change in a mole's shape or color. ? If you have a mole that is larger than the size of a pencil eraser.  If any of your family members has a history of skin cancer, especially at a young age, talk with your health care provider about genetic screening.  Always use sunscreen. Apply sunscreen liberally and repeatedly throughout the day.  Whenever you are outside, protect yourself by wearing long sleeves, pants, a wide-brimmed hat, and sunglasses. What should I know about osteoporosis? Osteoporosis is a condition in which bone destruction happens more quickly than new bone creation. After menopause, you may be at an increased risk for osteoporosis. To help prevent osteoporosis or the bone fractures that can happen because of osteoporosis, the following is recommended:  If you are 109-62 years old, get at least 1,000 mg of calcium and at least 600 mg of vitamin D per day.  If you are older than age 3 but younger than age 6, get at least 1,200 mg of calcium and at least 600 mg of vitamin D per day.  If you are older than age 38, get at least 1,200 mg of calcium and at least 800 mg of vitamin D per day. Smoking and excessive alcohol intake increase the risk of osteoporosis. Eat foods that are rich in calcium and vitamin D, and do weight-bearing exercises several times each week as directed by your health care provider. What should I know about how menopause affects my mental health? Depression may occur  at any age, but it is more common as you become older. Common symptoms of depression include:  Low or sad mood.  Changes in sleep patterns.  Changes in appetite or eating patterns.  Feeling an overall lack of motivation or enjoyment of activities that you previously enjoyed.  Frequent crying spells. Talk with your health care provider if you think that you are experiencing depression. What should I know about immunizations? It is important that you get and maintain your immunizations. These include:  Tetanus, diphtheria, and pertussis (Tdap) booster vaccine.  Influenza every year before the flu season begins.  Pneumonia vaccine.  Shingles vaccine. Your health care provider may also recommend other immunizations. This information is not intended to replace advice given to you by your  health care provider. Make sure you discuss any questions you have with your health care provider. Document Released: 08/15/2005 Document Revised: 01/11/2016 Document Reviewed: 03/27/2015 Elsevier Interactive Patient Education  2019 [employer].

## 2018-12-06 NOTE — Progress Notes (Signed)
Patient ID: Laura Reed, female    DOB: [birth year]  Age: 58 y.o. MRN: [id number]  The patient is here for annual Medicare wellness examination and management of other chronic and acute problems.    Mammogram due, last one April 2019 with ultrasound normal on right.    The risk factors are reflected in the social history.  The roster of all physicians providing medical care to patient - is listed in the Snapshot section of the chart.  Activities of daily living:  The patient is 100% independent in all ADLs: dressing, toileting, feeding as well as independent mobility  Home safety : The patient has smoke detectors in the home. They wear seatbelts.  There are no firearms at home. There is no violence in the home.   There is no risks for hepatitis, STDs or HIV. There is no   history of blood transfusion. They have no travel history to infectious disease endemic areas of the world.  The patient has seen their dentist in the last six month. They have seen their eye doctor in the last year. They admit to slight hearing difficulty with regard to whispered voices and some television programs.  They have deferred audiologic testing in the last year.  They do not  have excessive sun exposure. Discussed the need for sun protection: hats, long sleeves and use of sunscreen if there is significant sun exposure.   Diet: the importance of a healthy diet is discussed. They do have a healthy diet.  The benefits of regular aerobic exercise were discussed. She walks 4 times per week ,  20 minutes.   Depression screen: there are no signs or vegative symptoms of depression- irritability, change in appetite, anhedonia, sadness/tearfullness.  Cognitive assessment: the patient manages all their financial and personal affairs and is actively engaged. They could relate day,date,year and events; recalled 2/3 objects at 3 minutes; performed clock-face test normally.  The following portions of the patient's history  were reviewed and updated as appropriate: allergies, current medications, past family history, past medical history,  past surgical history, past social history  and problem list.  Visual acuity was not assessed per patient preference since she has regular follow up with her ophthalmologist. Hearing and body mass index were assessed and reviewed.   During the course of the visit the patient was educated and counseled about appropriate screening and preventive services including : fall prevention , diabetes screening, nutrition counseling, colorectal cancer screening, and recommended immunizations.    CC: The primary encounter diagnosis was Cervical cancer screening. Diagnoses of Routine general medical examination at a health care facility and Diabetic peripheral neuropathy associated with type 2 diabetes mellitus ([place]) were also pertinent to this visit.   The patient has no signs or symptoms of COVID 19 infection (fever, cough, sore throat  or shortness of breath beyond what is typical for patient).  Patient denies contact with other persons with the above mentioned symptoms or with anyone confirmed to have COVID 19   3 month follow up on diabetes.  Patient has no complaints today.  Patient  Feels she has been negligent in following a low glycemic index diet but she is  taking all prescribed medications regularly without side effects.  Fasting sugars have been under less than 140 most of the time and post prandials have been under 160 except on rare occasions. Patient is exercising about 3 times per week and intentionally trying to lose weight .  Patient has had an eye exam  in the last 12 months and has frequent evaluations by podiatry given her multiple bilateral amputations.   Patient does not walk barefoot outside.  She has profound neuropathy  With resulting  numbness  in feet. Patient is up to date on all recommended vaccinations  History Sena has a past medical history of Anxiety, Bladder  cancer ([place]), CHF (congestive heart failure) ([place]), Coronary artery disease, Diabetes mellitus, Heart murmur, Hemorrhoid, Hypertension, Neuropathy, PVD (peripheral vascular disease) ([place]), Thyroid nodule, and Urothelial carcinoma of kidney ([place]) (10/31/2014).   She has a past surgical history that includes Coronary artery bypass graft (2009); Arterial bypass surgry (2009, 2013 x 2); Cesarean section; Carotid endarterectomy (Right, 01/2014); Cholecystectomy (03-03-12); Colonoscopy w/ biopsies (04/28/2012); Nephrectomy (Left, 10-31-14); Hernia repair (10-31-14); Amputation toe; Cataract extraction w/PHACO (Right, 12/14/2014); Cardiac catheterization (Left, 05/01/2015); Cardiac catheterization (05/01/2015); Cardiac catheterization (Left, 02/20/2015); Cardiac catheterization; Transurethral resection of bladder tumor with mitomycin-c (N/A, 09/01/2016); Cystoscopy w/ retrogrades (Right, 09/01/2016); Lower Extremity Angiography (Left, 12/10/2016); Lower Extremity Angiography (Left, 02/02/2018); Lower Extremity Angiography (Left, 05/05/2018); Eye surgery; and Amputation toe (Right, 07/16/2018).   Her family history includes Breast cancer in her maternal grandmother; Cancer (age of onset: 52) in her mother; Cancer (age of onset: 55) in her father; Diabetes in her son.She reports that she quit smoking about 7 years ago. Her smoking use included cigarettes. She has a 70.00 pack-year smoking history. She has never used smokeless tobacco. She reports previous drug use. Drug: Cocaine. She reports that she does not drink alcohol.  Outpatient Medications Prior to Visit  Medication Sig Dispense Refill  . acetaminophen (TYLENOL) 500 MG tablet Take 1,000 mg by mouth every 6 (six) hours as needed for mild pain or headache.     . ALPRAZolam (XANAX) 0.5 MG tablet TAKE 1 TABLET BY MOUTH DAILY AS NEEDED FOR ANXIETY 30 tablet 5  . amLODipine (NORVASC) 5 MG tablet TAKE 1 TABLET(5 MG) BY MOUTH DAILY 90 tablet 1  . aspirin 81 MG chewable tablet  Chew 81 mg by mouth daily.    Marland Kitchen atorvastatin (LIPITOR) 20 MG tablet TAKE 1 TABLET BY MOUTH EVERY DAY 90 tablet 1  . Cholecalciferol (VITAMIN D3) 2000 UNITS TABS Take 2,000 Units by mouth daily after supper.     . clopidogrel (PLAVIX) 75 MG tablet TAKE 1 TABLET BY MOUTH EVERY DAY 90 tablet 1  . digoxin (LANOXIN) 0.125 MG tablet TAKE 1 TABLET BY MOUTH EVERY DAY 90 tablet 1  . gabapentin (NEURONTIN) 600 MG tablet TAKE 1 TABLET BY MOUTH THREE TIMES DAILY 270 tablet 1  . glucose blood (ACCU-CHEK AVIVA PLUS) test strip Test blood sugar 2-3 times/ day. 300 each 1  . HUMALOG MIX 75/25 KWIKPEN (75-25) 100 UNIT/ML Kwikpen INJECT 30 UNITS UNDER THE SKIN EVERY MORNING AND 36 UNITS EVERY EVENING 30 mL 2  . Insulin Pen Needle (B-D ULTRAFINE III SHORT PEN) 31G X 8 MM MISC TEST BLOOD SUGAR TWICE DAILY 100 each 2  . Lancets Misc. MISC Use to check blood sugars 3 times daily 100 each 11  . LEVEMIR FLEXTOUCH 100 UNIT/ML Pen INJECT 40 UNITS UNDER THE SKIN EVERY DAY AT 10 PM 15 mL 2  . lisinopril (PRINIVIL,ZESTRIL) 5 MG tablet Take 1 tablet (5 mg total) by mouth daily. 90 tablet 3  . neomycin-bacitracin-polymyxin (NEOSPORIN) ointment Apply 1 application topically as needed for wound care.    Marland Kitchen HYDROcodone-acetaminophen (NORCO/VICODIN) 5-325 MG tablet Take 1 tablet by mouth every 4 (four) hours as needed for moderate pain. 20 tablet 0  .  TRUEPLUS LANCETS 28G MISC CHECK BLOOD SUGAR FOUR TIMES DAILY 200 each 0   No facility-administered medications prior to visit.     Review of Systems   Patient denies headache, fevers, malaise, unintentional weight loss, skin rash, eye pain, sinus congestion and sinus pain, sore throat, dysphagia,  hemoptysis , cough, dyspnea, wheezing, chest pain, palpitations, orthopnea, edema, abdominal pain, nausea, melena, diarrhea, constipation, flank pain, dysuria, hematuria, urinary  Frequency, nocturia, numbness, tingling, seizures,  Focal weakness, Loss of consciousness,  Tremor, insomnia,  depression, anxiety, and suicidal ideation.     Objective:  BP (!) 148/62 (BP Location: Left Arm, Patient Position: Sitting, Cuff Size: Normal)   Pulse 79   Temp 98.3 F (36.8 C) (Oral)   Resp 15   Ht 5\' 7"  (1.702 m)   Wt 166 lb 12.8 oz (75.7 kg)   SpO2 97%   BMI 26.12 kg/m   Physical Exam   General Appearance:    Alert, cooperative, no distress, appears stated age  Head:    Normocephalic, without obvious abnormality, atraumatic  Eyes:    PERRL, conjunctiva/corneas clear, EOM's intact, fundi    benign, both eyes  Ears:    Normal TM's and external ear canals, both ears  Nose:   Nares normal, septum midline, mucosa normal, no drainage    or sinus tenderness  Throat:   Lips, mucosa, and tongue normal; teeth and gums normal  Neck:   Supple, symmetrical, trachea midline, no adenopathy;    thyroid:  no enlargement/tenderness/nodules; no carotid   bruit or JVD  Back:     Symmetric, no curvature, ROM normal, no CVA tenderness  Lungs:     Clear to auscultation bilaterally, respirations unlabored  Chest Wall:    No tenderness or deformity   Heart:    Regular rate and rhythm, S1 and S2 normal, no murmur, rub   or gallop  Breast Exam:    No tenderness, masses, or nipple abnormality  Abdomen:     Soft, non-tender, bowel sounds active all four quadrants,    no masses, no organomegaly  Genitalia:    Pelvic: cervix normal in appearance, external genitalia normal, no adnexal masses or tenderness, no cervical motion tenderness, rectovaginal septum normal, uterus normal size, shape, and consistency and vagina normal without discharge  Extremities:   Extremities normal, atraumatic, no cyanosis or edema  Pulses:   2+ and symmetric all extremities  Skin:   Skin color, texture, turgor normal, no rashes or lesions  Lymph nodes:   Cervical, supraclavicular, and axillary nodes normal  Neurologic:   CNII-XII intact, normal strength, sensation and reflexes    throughout      Assessment & Plan:    Problem List Items Addressed This Visit    Routine general medical examination at a health care facility    age appropriate education and counseling updated, referrals for preventative services and immunizations addressed, dietary and smoking counseling addressed, most recent labs reviewed.  I have personally reviewed and have noted:  1) the patient's medical and social history 2) The pt's use of alcohol, tobacco, and illicit drugs 3) The patient's current medications and supplements 4) Functional ability including ADL's, fall risk, home safety risk, hearing and visual impairment 5) Diet and physical activities 6) Evidence for depression or mood disorder 7) The patient's height, weight, and BMI have been recorded in the chart  I have made referrals, and provided counseling and education based on review of the above      Diabetic peripheral  neuropathy associated with type 2 diabetes mellitus ([place])    Currently well-controlled on current medications .  hemoglobin A1c is at goal of less than 7.0 . Patient is reminded to schedule an annual eye exam and foot exam is normal today. Patient has no microalbuminuria. Patient is tolerating statin therapy for CAD risk reduction and on ACE/ARB for renal protection and hypertension        Other Visit Diagnoses    Cervical cancer screening    -  Primary   Relevant Orders   Cytology - PAP( [place])      I have discontinued Hoyle Sauer E. Dames's TRUEplus Lancets 28G and HYDROcodone-acetaminophen. I am also having her maintain her Vitamin D3, aspirin, acetaminophen, Lancets Misc., neomycin-bacitracin-polymyxin, lisinopril, clopidogrel, atorvastatin, HumaLOG Mix 75/25 KwikPen, amLODipine, digoxin, gabapentin, glucose blood, Levemir FlexTouch, ALPRAZolam, and Insulin Pen Needle.  No orders of the defined types were placed in this encounter.   Medications Discontinued During This Encounter  Medication Reason  . TRUEPLUS LANCETS 28G MISC Error  .  HYDROcodone-acetaminophen (NORCO/VICODIN) 5-325 MG tablet Error    Follow-up: No follow-ups on file.   [name], MD

## 2018-12-07 NOTE — Assessment & Plan Note (Signed)
age appropriate education and counseling updated, referrals for preventative services and immunizations addressed, dietary and smoking counseling addressed, most recent labs reviewed.  I have personally reviewed and have noted:  1) the patient's medical and social history 2) The pt's use of alcohol, tobacco, and illicit drugs 3) The patient's current medications and supplements 4) Functional ability including ADL's, fall risk, home safety risk, hearing and visual impairment 5) Diet and physical activities 6) Evidence for depression or mood disorder 7) The patient's height, weight, and BMI have been recorded in the chart  I have made referrals, and provided counseling and education based on review of the above 

## 2018-12-07 NOTE — Assessment & Plan Note (Signed)
Currently well-controlled on current medications .  hemoglobin A1c is at goal of less than 7.0 . Patient is reminded to schedule an annual eye exam and foot exam is normal today. Patient has no microalbuminuria. Patient is tolerating statin therapy for CAD risk reduction and on ACE/ARB for renal protection and hypertension

## 2018-12-08 ENCOUNTER — Encounter: Payer: Self-pay | Admitting: Urology

## 2018-12-08 ENCOUNTER — Other Ambulatory Visit: Payer: Medicare Other | Admitting: Urology

## 2018-12-09 LAB — CYTOLOGY - PAP: Diagnosis: NEGATIVE

## 2018-12-17 DIAGNOSIS — L97522 Non-pressure chronic ulcer of other part of left foot with fat layer exposed: Secondary | ICD-10-CM | POA: Diagnosis not present

## 2018-12-21 ENCOUNTER — Other Ambulatory Visit: Payer: Self-pay

## 2018-12-21 MED ORDER — INSULIN LISPRO PROT & LISPRO (75-25 MIX) 100 UNIT/ML KWIKPEN: PEN_INJECTOR | SUBCUTANEOUS | 2 refills | Status: DC

## 2018-12-23 ENCOUNTER — Encounter: Payer: Self-pay | Admitting: Internal Medicine

## 2018-12-29 ENCOUNTER — Other Ambulatory Visit: Payer: Self-pay

## 2018-12-29 ENCOUNTER — Ambulatory Visit
Admission: RE | Admit: 2018-12-29 | Discharge: 2018-12-29 | Disposition: A | Discharge status: Home / Self Care | Payer: Medicare Other | Source: Ambulatory Visit | Attending: Internal Medicine | Admitting: Internal Medicine

## 2018-12-29 DIAGNOSIS — Z1231 Encounter for screening mammogram for malignant neoplasm of breast: Secondary | ICD-10-CM | POA: Insufficient documentation

## 2018-12-29 DIAGNOSIS — Z1239 Encounter for other screening for malignant neoplasm of breast: Secondary | ICD-10-CM

## 2019-01-05 ENCOUNTER — Ambulatory Visit (INDEPENDENT_AMBULATORY_CARE_PROVIDER_SITE_OTHER): Payer: Medicare Other | Admitting: Urology

## 2019-01-05 ENCOUNTER — Other Ambulatory Visit: Payer: Self-pay

## 2019-01-05 ENCOUNTER — Encounter: Payer: Self-pay | Admitting: Urology

## 2019-01-05 VITALS — BP 186/75 | HR 80 | Ht 67.0 in | Wt 167.0 lb

## 2019-01-05 DIAGNOSIS — R319 Hematuria, unspecified: Secondary | ICD-10-CM | POA: Diagnosis not present

## 2019-01-05 DIAGNOSIS — C679 Malignant neoplasm of bladder, unspecified: Secondary | ICD-10-CM | POA: Diagnosis not present

## 2019-01-05 LAB — URINALYSIS, COMPLETE
Bilirubin, UA: NEGATIVE
Ketones, UA: NEGATIVE
Nitrite, UA: NEGATIVE
Specific Gravity, UA: 1.01 (ref 1.005–1.030)
Urobilinogen, Ur: 0.2 mg/dL (ref 0.2–1.0)
pH, UA: 5.5 (ref 5.0–7.5)

## 2019-01-05 LAB — MICROSCOPIC EXAMINATION

## 2019-01-06 ENCOUNTER — Encounter: Payer: Self-pay | Admitting: Urology

## 2019-01-06 NOTE — Progress Notes (Signed)
   01/05/19  CC:  Chief Complaint  Patient presents with  . Cysto    HPI: Laura Reed is a 58 y.o. female with with history of pT1N0 left upper tract urothelial carcinoma s/p nephroU 4/016 with low grade Ta bladder recurrence s/p TURBT, mitomycin, right retrograde pyelogram on 2/18.    She returns today for routine f/u cystoscopy.  At last visit, she had multiple erythematous patches in her bladder which are being followed.  She is just come back in 6 weeks, however in light of COVID-19, this had been delayed.  Notably her urine cytology was negative in 08/2018.  Patient denies urinary symptoms today.  1- Left Upper Tract Transitional Cell Carcinoma/ Bladder cancer recurrence- s/p LEFT robotic nephroureterectomy on 10/31/2014 as well as combined ventral hernia repair with Dr. Bary Castilla. Post complicated by wound infection now well healed. Pathology pT1N0 with negative margins.  Recent Surveillance: 02/2015 - cysto NED; 05/2015 cysto NED, 08/2015- cysto NED, 11/2015- NED, CT Urogram 11/12/15 negative, poor quality of delayed phase.; 5/2-17- cysto with mild erythema following recent UTI; 02/2016 NED; 08/2016 Lg Ta TCC recurrence, 1 cm, multifocal. RTG neg.  08/2016- TURBT for bladder 1 cm recurrence near bladder neck and left UO, multifocal. R RTG negative. Mitomycin. LgTa. 10/2016- Induction BCG x 6 08/2017- Maintenance BCG x 3  2 - Solitary Kidney - s/p left nephrectomy as per above. Most recent Cr 0.99 07/12/2018  She does have an extensive smoking history, quit 5years ago but smoked up to 2 packs a day for 35 years. She also has multiple medical comorbidities including history of diabetes, CAD status post CABG, carotid endarterectomy.   Blood pressure (!) 186/75, pulse 80, height 5\' 7"  (1.702 m), weight 167 lb (75.8 kg). NED. A&Ox3.   No respiratory distress   Abd soft, NT, ND Normal external genitalia with patent urethral meatus  Cystoscopy Procedure Note  Patient  identification was confirmed, informed consent was obtained, and patient was prepped using Betadine solution.  Lidocaine jelly was administered per urethral meatus.    Preoperative abx where received prior to procedure.    Procedure: - Flexible cystoscope introduced, without any difficulty.   - Thorough search of the bladder revealed:    normal urethral meatus    -no evidence of papillary tumor.     no stones    no ulcers     no tumors    no urethral polyps    no trabeculation    Nonspecific diffuse patches of erythema with possible differential diagnosis including cystitis v. CIS, no obvious papillary lesion, stable from previous cystoscopy  - Right UO normal. Left UO was surgically absent.  Post-Procedure: - Patient tolerated the procedure well  Assessment/ Plan:  1. Malignant neoplasm of urinary bladder, unspecified site (HCC)  - Nonspecific diffuse patches of erythema with possible differential diagnosis including cystitis v. CIS, no obvious papillary lesion -Negative urine cytology is reassuring, will resend again today -Patient indicates today that she may be undergoing a foot surgery in the near future with Dr. Caryl Comes.  Advised her if that is the case, would like to biopsy these lesions while she is under general anesthesia to which she is agreeable.  She will contact us if and when this happens - Patient would like to resume maintainance BCG, will arrange  Return for BCG x 3 then cysto in 3 months thereafter.   Hollice Espy, MD

## 2019-01-11 ENCOUNTER — Other Ambulatory Visit: Payer: Self-pay

## 2019-01-11 ENCOUNTER — Other Ambulatory Visit: Payer: Self-pay | Admitting: Urology

## 2019-01-11 MED ORDER — ATORVASTATIN CALCIUM 20 MG PO TABS: 20.0000 mg | ORAL_TABLET | Freq: Every day | ORAL | 1 refills | Status: DC

## 2019-01-12 ENCOUNTER — Telehealth: Payer: Self-pay

## 2019-01-12 NOTE — Telephone Encounter (Signed)
Spoke with patient and notified her BCG maint x3 will start on 01-28-19 and BCG review instructions were sent via mychart per patient

## 2019-01-17 DIAGNOSIS — Z794 Long term (current) use of insulin: Secondary | ICD-10-CM | POA: Diagnosis not present

## 2019-01-17 DIAGNOSIS — I739 Peripheral vascular disease, unspecified: Secondary | ICD-10-CM | POA: Diagnosis not present

## 2019-01-17 DIAGNOSIS — M86172 Other acute osteomyelitis, left ankle and foot: Secondary | ICD-10-CM | POA: Diagnosis not present

## 2019-01-17 DIAGNOSIS — E1140 Type 2 diabetes mellitus with diabetic neuropathy, unspecified: Secondary | ICD-10-CM | POA: Diagnosis not present

## 2019-01-17 DIAGNOSIS — L97522 Non-pressure chronic ulcer of other part of left foot with fat layer exposed: Secondary | ICD-10-CM | POA: Diagnosis not present

## 2019-01-18 ENCOUNTER — Inpatient Hospital Stay: Payer: Medicare Other | Admitting: Anesthesiology

## 2019-01-18 ENCOUNTER — Inpatient Hospital Stay: Admission: AD | Admit: 2019-01-18 | Payer: Medicare Other | Source: Ambulatory Visit | Admitting: Internal Medicine

## 2019-01-18 ENCOUNTER — Encounter
Admission: EM | Disposition: A | Discharge status: Home Health | Payer: Self-pay | Source: Home / Self Care | Attending: Internal Medicine

## 2019-01-18 ENCOUNTER — Encounter: Payer: Self-pay | Admitting: Emergency Medicine

## 2019-01-18 ENCOUNTER — Other Ambulatory Visit: Payer: Self-pay

## 2019-01-18 ENCOUNTER — Inpatient Hospital Stay
Admission: EM | Admit: 2019-01-18 | Discharge: 2019-01-19 | DRG: 629 | Disposition: A | Discharge status: Home Health | Payer: Medicare Other | Attending: Internal Medicine | Admitting: Internal Medicine

## 2019-01-18 DIAGNOSIS — E1151 Type 2 diabetes mellitus with diabetic peripheral angiopathy without gangrene: Secondary | ICD-10-CM | POA: Diagnosis present

## 2019-01-18 DIAGNOSIS — E1140 Type 2 diabetes mellitus with diabetic neuropathy, unspecified: Secondary | ICD-10-CM | POA: Diagnosis not present

## 2019-01-18 DIAGNOSIS — Z85528 Personal history of other malignant neoplasm of kidney: Secondary | ICD-10-CM | POA: Diagnosis not present

## 2019-01-18 DIAGNOSIS — E1169 Type 2 diabetes mellitus with other specified complication: Principal | ICD-10-CM | POA: Diagnosis present

## 2019-01-18 DIAGNOSIS — Z89411 Acquired absence of right great toe: Secondary | ICD-10-CM

## 2019-01-18 DIAGNOSIS — Z803 Family history of malignant neoplasm of breast: Secondary | ICD-10-CM

## 2019-01-18 DIAGNOSIS — I2510 Atherosclerotic heart disease of native coronary artery without angina pectoris: Secondary | ICD-10-CM | POA: Diagnosis not present

## 2019-01-18 DIAGNOSIS — L97524 Non-pressure chronic ulcer of other part of left foot with necrosis of bone: Secondary | ICD-10-CM | POA: Diagnosis not present

## 2019-01-18 DIAGNOSIS — E785 Hyperlipidemia, unspecified: Secondary | ICD-10-CM | POA: Diagnosis not present

## 2019-01-18 DIAGNOSIS — F419 Anxiety disorder, unspecified: Secondary | ICD-10-CM | POA: Diagnosis present

## 2019-01-18 DIAGNOSIS — Z9841 Cataract extraction status, right eye: Secondary | ICD-10-CM | POA: Diagnosis not present

## 2019-01-18 DIAGNOSIS — Z03818 Encounter for observation for suspected exposure to other biological agents ruled out: Secondary | ICD-10-CM | POA: Diagnosis not present

## 2019-01-18 DIAGNOSIS — Z89421 Acquired absence of other right toe(s): Secondary | ICD-10-CM

## 2019-01-18 DIAGNOSIS — L97529 Non-pressure chronic ulcer of other part of left foot with unspecified severity: Secondary | ICD-10-CM | POA: Diagnosis present

## 2019-01-18 DIAGNOSIS — Z951 Presence of aortocoronary bypass graft: Secondary | ICD-10-CM | POA: Diagnosis not present

## 2019-01-18 DIAGNOSIS — Z801 Family history of malignant neoplasm of trachea, bronchus and lung: Secondary | ICD-10-CM

## 2019-01-18 DIAGNOSIS — E11621 Type 2 diabetes mellitus with foot ulcer: Secondary | ICD-10-CM | POA: Diagnosis present

## 2019-01-18 DIAGNOSIS — Z833 Family history of diabetes mellitus: Secondary | ICD-10-CM

## 2019-01-18 DIAGNOSIS — Z794 Long term (current) use of insulin: Secondary | ICD-10-CM

## 2019-01-18 DIAGNOSIS — Z8551 Personal history of malignant neoplasm of bladder: Secondary | ICD-10-CM

## 2019-01-18 DIAGNOSIS — Z87891 Personal history of nicotine dependence: Secondary | ICD-10-CM | POA: Diagnosis not present

## 2019-01-18 DIAGNOSIS — M86172 Other acute osteomyelitis, left ankle and foot: Secondary | ICD-10-CM | POA: Diagnosis present

## 2019-01-18 DIAGNOSIS — I739 Peripheral vascular disease, unspecified: Secondary | ICD-10-CM | POA: Diagnosis not present

## 2019-01-18 DIAGNOSIS — Z905 Acquired absence of kidney: Secondary | ICD-10-CM | POA: Diagnosis not present

## 2019-01-18 DIAGNOSIS — Z961 Presence of intraocular lens: Secondary | ICD-10-CM | POA: Diagnosis present

## 2019-01-18 DIAGNOSIS — Z7982 Long term (current) use of aspirin: Secondary | ICD-10-CM

## 2019-01-18 DIAGNOSIS — I10 Essential (primary) hypertension: Secondary | ICD-10-CM | POA: Diagnosis not present

## 2019-01-18 DIAGNOSIS — M869 Osteomyelitis, unspecified: Secondary | ICD-10-CM | POA: Diagnosis not present

## 2019-01-18 DIAGNOSIS — I509 Heart failure, unspecified: Secondary | ICD-10-CM | POA: Diagnosis present

## 2019-01-18 DIAGNOSIS — Z7902 Long term (current) use of antithrombotics/antiplatelets: Secondary | ICD-10-CM

## 2019-01-18 DIAGNOSIS — Z1159 Encounter for screening for other viral diseases: Secondary | ICD-10-CM | POA: Diagnosis not present

## 2019-01-18 DIAGNOSIS — I110 Hypertensive heart disease with heart failure: Secondary | ICD-10-CM | POA: Diagnosis present

## 2019-01-18 DIAGNOSIS — E1142 Type 2 diabetes mellitus with diabetic polyneuropathy: Secondary | ICD-10-CM | POA: Diagnosis not present

## 2019-01-18 DIAGNOSIS — E119 Type 2 diabetes mellitus without complications: Secondary | ICD-10-CM | POA: Diagnosis not present

## 2019-01-18 DIAGNOSIS — Z7689 Persons encountering health services in other specified circumstances: Secondary | ICD-10-CM | POA: Diagnosis not present

## 2019-01-18 DIAGNOSIS — Z79899 Other long term (current) drug therapy: Secondary | ICD-10-CM | POA: Diagnosis not present

## 2019-01-18 DIAGNOSIS — E11628 Type 2 diabetes mellitus with other skin complications: Secondary | ICD-10-CM | POA: Diagnosis not present

## 2019-01-18 HISTORY — PX: IRRIGATION AND DEBRIDEMENT FOOT: SHX6602

## 2019-01-18 LAB — CBC
HCT: 36.8 % (ref 36.0–46.0)
Hemoglobin: 12.4 g/dL (ref 12.0–15.0)
MCH: 29.2 pg (ref 26.0–34.0)
MCHC: 33.7 g/dL (ref 30.0–36.0)
MCV: 86.6 fL (ref 80.0–100.0)
Platelets: 271 10*3/uL (ref 150–400)
RBC: 4.25 MIL/uL (ref 3.87–5.11)
RDW: 12.8 % (ref 11.5–15.5)
WBC: 11.4 10*3/uL — ABNORMAL HIGH (ref 4.0–10.5)
nRBC: 0 % (ref 0.0–0.2)

## 2019-01-18 LAB — CBC WITH DIFFERENTIAL/PLATELET
Abs Immature Granulocytes: 0.08 10*3/uL — ABNORMAL HIGH (ref 0.00–0.07)
Basophils Absolute: 0.1 10*3/uL (ref 0.0–0.1)
Basophils Relative: 1 %
Eosinophils Absolute: 0.2 10*3/uL (ref 0.0–0.5)
Eosinophils Relative: 2 %
HCT: 34.7 % — ABNORMAL LOW (ref 36.0–46.0)
Hemoglobin: 11.9 g/dL — ABNORMAL LOW (ref 12.0–15.0)
Immature Granulocytes: 1 %
Lymphocytes Relative: 13 %
Lymphs Abs: 1.6 10*3/uL (ref 0.7–4.0)
MCH: 29.2 pg (ref 26.0–34.0)
MCHC: 34.3 g/dL (ref 30.0–36.0)
MCV: 85.3 fL (ref 80.0–100.0)
Monocytes Absolute: 0.9 10*3/uL (ref 0.1–1.0)
Monocytes Relative: 7 %
Neutro Abs: 9.9 10*3/uL — ABNORMAL HIGH (ref 1.7–7.7)
Neutrophils Relative %: 76 %
Platelets: 277 10*3/uL (ref 150–400)
RBC: 4.07 MIL/uL (ref 3.87–5.11)
RDW: 12.8 % (ref 11.5–15.5)
WBC: 12.8 10*3/uL — ABNORMAL HIGH (ref 4.0–10.5)
nRBC: 0 % (ref 0.0–0.2)

## 2019-01-18 LAB — COMPREHENSIVE METABOLIC PANEL
ALT: 15 U/L (ref 0–44)
AST: 17 U/L (ref 15–41)
Albumin: 3.6 g/dL (ref 3.5–5.0)
Alkaline Phosphatase: 88 U/L (ref 38–126)
Anion gap: 9 (ref 5–15)
BUN: 33 mg/dL — ABNORMAL HIGH (ref 6–20)
CO2: 22 mmol/L (ref 22–32)
Calcium: 8.8 mg/dL — ABNORMAL LOW (ref 8.9–10.3)
Chloride: 104 mmol/L (ref 98–111)
Creatinine, Ser: 1.04 mg/dL — ABNORMAL HIGH (ref 0.44–1.00)
GFR calc Af Amer: >60 mL/min (ref >60)
GFR calc non Af Amer: 58 mL/min — ABNORMAL LOW (ref >60)
Glucose, Bld: 160 mg/dL — ABNORMAL HIGH (ref 70–99)
Potassium: 4.1 mmol/L (ref 3.5–5.1)
Sodium: 135 mmol/L (ref 135–145)
Total Bilirubin: 0.6 mg/dL (ref 0.3–1.2)
Total Protein: 7.2 g/dL (ref 6.5–8.1)

## 2019-01-18 LAB — CREATININE, SERUM
Creatinine, Ser: 0.94 mg/dL (ref 0.44–1.00)
GFR calc Af Amer: >60 mL/min (ref >60)
GFR calc non Af Amer: >60 mL/min (ref >60)

## 2019-01-18 LAB — SARS CORONAVIRUS 2 BY RT PCR (HOSPITAL ORDER, PERFORMED IN ~~LOC~~ HOSPITAL LAB): SARS Coronavirus 2: NEGATIVE

## 2019-01-18 LAB — GLUCOSE, CAPILLARY
Glucose-Capillary: 131 mg/dL — ABNORMAL HIGH (ref 70–99)
Glucose-Capillary: 162 mg/dL — ABNORMAL HIGH (ref 70–99)

## 2019-01-18 SURGERY — IRRIGATION AND DEBRIDEMENT FOOT
Anesthesia: General | Site: Foot | Laterality: Left

## 2019-01-18 MED ORDER — CLOPIDOGREL BISULFATE 75 MG PO TABS
75.0000 mg | ORAL_TABLET | Freq: Every day | ORAL | Status: DC
  Administered 2019-01-18 – 2019-01-19 (×2): 75 mg via ORAL
  Filled 2019-01-18 (×2): qty 1

## 2019-01-18 MED ORDER — GABAPENTIN 600 MG PO TABS
600.0000 mg | ORAL_TABLET | Freq: Three times a day (TID) | ORAL | Status: DC
  Administered 2019-01-18 – 2019-01-19 (×2): 600 mg via ORAL
  Filled 2019-01-18 (×4): qty 1

## 2019-01-18 MED ORDER — DEXAMETHASONE SODIUM PHOSPHATE 10 MG/ML IJ SOLN
INTRAMUSCULAR | Status: DC | PRN
  Administered 2019-01-18: 5 mg via INTRAVENOUS

## 2019-01-18 MED ORDER — INSULIN ASPART 100 UNIT/ML ~~LOC~~ SOLN
0.0000 [IU] | Freq: Every day | SUBCUTANEOUS | Status: DC
  Filled 2019-01-18: qty 1

## 2019-01-18 MED ORDER — FENTANYL CITRATE (PF) 100 MCG/2ML IJ SOLN
INTRAMUSCULAR | Status: AC
  Filled 2019-01-18: qty 2

## 2019-01-18 MED ORDER — MIDAZOLAM HCL 2 MG/2ML IJ SOLN
INTRAMUSCULAR | Status: DC | PRN
  Administered 2019-01-18: 2 mg via INTRAVENOUS

## 2019-01-18 MED ORDER — LIDOCAINE HCL (CARDIAC) PF 100 MG/5ML IV SOSY
PREFILLED_SYRINGE | INTRAVENOUS | Status: DC | PRN
  Administered 2019-01-18: 100 mg via INTRAVENOUS

## 2019-01-18 MED ORDER — ONDANSETRON HCL 4 MG/2ML IJ SOLN: 4.0000 mg | Freq: Once | INTRAMUSCULAR | Status: DC | PRN

## 2019-01-18 MED ORDER — ONDANSETRON HCL 4 MG/2ML IJ SOLN: 4.0000 mg | Freq: Four times a day (QID) | INTRAMUSCULAR | Status: DC | PRN

## 2019-01-18 MED ORDER — LACTATED RINGERS IV SOLN: INTRAVENOUS | Status: DC | PRN

## 2019-01-18 MED ORDER — ONDANSETRON HCL 4 MG PO TABS: 4.0000 mg | ORAL_TABLET | Freq: Four times a day (QID) | ORAL | Status: DC | PRN

## 2019-01-18 MED ORDER — PIPERACILLIN-TAZOBACTAM 3.375 G IVPB
3.3750 g | Freq: Three times a day (TID) | INTRAVENOUS | Status: DC
  Administered 2019-01-18 – 2019-01-19 (×2): 3.375 g via INTRAVENOUS
  Filled 2019-01-18 (×2): qty 50

## 2019-01-18 MED ORDER — FENTANYL CITRATE (PF) 100 MCG/2ML IJ SOLN
INTRAMUSCULAR | Status: DC | PRN
  Administered 2019-01-18: 50 ug via INTRAVENOUS
  Administered 2019-01-18: 25 ug via INTRAVENOUS

## 2019-01-18 MED ORDER — VITAMIN D 25 MCG (1000 UNIT) PO TABS
2000.0000 [IU] | ORAL_TABLET | Freq: Every day | ORAL | Status: DC
  Administered 2019-01-18: 2000 [IU] via ORAL
  Filled 2019-01-18: qty 2

## 2019-01-18 MED ORDER — VANCOMYCIN HCL IN DEXTROSE 1-5 GM/200ML-% IV SOLN
1000.0000 mg | Freq: Once | INTRAVENOUS | Status: AC
  Administered 2019-01-18: 1000 mg via INTRAVENOUS
  Filled 2019-01-18: qty 200

## 2019-01-18 MED ORDER — ALPRAZOLAM 0.5 MG PO TABS: 0.5000 mg | ORAL_TABLET | Freq: Three times a day (TID) | ORAL | Status: DC | PRN

## 2019-01-18 MED ORDER — EPHEDRINE SULFATE 50 MG/ML IJ SOLN
INTRAMUSCULAR | Status: DC | PRN
  Administered 2019-01-18: 10 mg via INTRAVENOUS

## 2019-01-18 MED ORDER — LIDOCAINE HCL (PF) 2 % IJ SOLN
INTRAMUSCULAR | Status: AC
  Filled 2019-01-18: qty 10

## 2019-01-18 MED ORDER — VANCOMYCIN HCL 500 MG IV SOLR
500.0000 mg | Freq: Once | INTRAVENOUS | Status: AC
  Administered 2019-01-18: 500 mg via INTRAVENOUS
  Filled 2019-01-18: qty 500

## 2019-01-18 MED ORDER — DIGOXIN 125 MCG PO TABS
125.0000 ug | ORAL_TABLET | Freq: Every day | ORAL | Status: DC
  Administered 2019-01-18: 125 ug via ORAL
  Filled 2019-01-18 (×2): qty 1

## 2019-01-18 MED ORDER — AMLODIPINE BESYLATE 5 MG PO TABS
5.0000 mg | ORAL_TABLET | Freq: Every day | ORAL | Status: DC
  Administered 2019-01-18 – 2019-01-19 (×2): 5 mg via ORAL
  Filled 2019-01-18 (×2): qty 1

## 2019-01-18 MED ORDER — BUPIVACAINE HCL (PF) 0.5 % IJ SOLN
INTRAMUSCULAR | Status: DC | PRN
  Administered 2019-01-18: 7 mL

## 2019-01-18 MED ORDER — ACETAMINOPHEN 650 MG RE SUPP: 650.0000 mg | Freq: Four times a day (QID) | RECTAL | Status: DC | PRN

## 2019-01-18 MED ORDER — ASPIRIN 81 MG PO CHEW
81.0000 mg | CHEWABLE_TABLET | Freq: Every day | ORAL | Status: DC
  Administered 2019-01-18: 81 mg via ORAL
  Filled 2019-01-18: qty 1

## 2019-01-18 MED ORDER — ACETAMINOPHEN 10 MG/ML IV SOLN
INTRAVENOUS | Status: AC
  Filled 2019-01-18: qty 100

## 2019-01-18 MED ORDER — SODIUM CHLORIDE 0.9 % IR SOLN
Status: DC | PRN
  Administered 2019-01-18: 18:00:00 500 mL

## 2019-01-18 MED ORDER — MIDAZOLAM HCL 2 MG/2ML IJ SOLN
INTRAMUSCULAR | Status: AC
  Filled 2019-01-18: qty 2

## 2019-01-18 MED ORDER — PIPERACILLIN-TAZOBACTAM 3.375 G IVPB 30 MIN
3.3750 g | Freq: Once | INTRAVENOUS | Status: AC
  Administered 2019-01-18: 12:00:00 3.375 g via INTRAVENOUS
  Filled 2019-01-18: qty 50

## 2019-01-18 MED ORDER — PROPOFOL 10 MG/ML IV BOLUS
INTRAVENOUS | Status: AC
  Filled 2019-01-18: qty 20

## 2019-01-18 MED ORDER — GLYCOPYRROLATE 0.2 MG/ML IJ SOLN
INTRAMUSCULAR | Status: AC
  Filled 2019-01-18: qty 1

## 2019-01-18 MED ORDER — HYDROCODONE-ACETAMINOPHEN 5-325 MG PO TABS
1.0000 | ORAL_TABLET | ORAL | Status: DC | PRN
  Administered 2019-01-19: 1 via ORAL
  Filled 2019-01-18: qty 1

## 2019-01-18 MED ORDER — ATORVASTATIN CALCIUM 20 MG PO TABS
20.0000 mg | ORAL_TABLET | Freq: Every day | ORAL | Status: DC
  Administered 2019-01-18: 20 mg via ORAL
  Filled 2019-01-18: qty 1

## 2019-01-18 MED ORDER — ACETAMINOPHEN 500 MG PO TABS: 1000.0000 mg | ORAL_TABLET | Freq: Four times a day (QID) | ORAL | Status: DC | PRN

## 2019-01-18 MED ORDER — ONDANSETRON HCL 4 MG/2ML IJ SOLN
INTRAMUSCULAR | Status: AC
  Filled 2019-01-18: qty 2

## 2019-01-18 MED ORDER — VANCOMYCIN HCL 10 G IV SOLR
1500.0000 mg | INTRAVENOUS | Status: DC
  Administered 2019-01-19: 1500 mg via INTRAVENOUS
  Filled 2019-01-18 (×2): qty 1500

## 2019-01-18 MED ORDER — LISINOPRIL 5 MG PO TABS
5.0000 mg | ORAL_TABLET | Freq: Every day | ORAL | Status: DC
  Administered 2019-01-18 – 2019-01-19 (×2): 5 mg via ORAL
  Filled 2019-01-18 (×2): qty 1

## 2019-01-18 MED ORDER — EPHEDRINE SULFATE 50 MG/ML IJ SOLN
INTRAMUSCULAR | Status: AC
  Filled 2019-01-18: qty 1

## 2019-01-18 MED ORDER — GLYCOPYRROLATE 0.2 MG/ML IJ SOLN
INTRAMUSCULAR | Status: DC | PRN
  Administered 2019-01-18: 0.2 mg via INTRAVENOUS

## 2019-01-18 MED ORDER — INSULIN DETEMIR 100 UNIT/ML ~~LOC~~ SOLN
30.0000 [IU] | Freq: Every day | SUBCUTANEOUS | Status: DC
  Administered 2019-01-18: 30 [IU] via SUBCUTANEOUS
  Filled 2019-01-18 (×2): qty 0.3

## 2019-01-18 MED ORDER — INSULIN ASPART 100 UNIT/ML ~~LOC~~ SOLN
0.0000 [IU] | Freq: Three times a day (TID) | SUBCUTANEOUS | Status: DC
  Administered 2019-01-19: 9 [IU] via SUBCUTANEOUS
  Administered 2019-01-19: 5 [IU] via SUBCUTANEOUS
  Filled 2019-01-18 (×2): qty 1

## 2019-01-18 MED ORDER — FENTANYL CITRATE (PF) 100 MCG/2ML IJ SOLN: 25.0000 ug | INTRAMUSCULAR | Status: DC | PRN

## 2019-01-18 MED ORDER — SODIUM CHLORIDE 0.9 % IV SOLN
INTRAVENOUS | Status: DC
  Administered 2019-01-18: 17:00:00 via INTRAVENOUS

## 2019-01-18 MED ORDER — ACETAMINOPHEN 325 MG PO TABS: 650.0000 mg | ORAL_TABLET | Freq: Four times a day (QID) | ORAL | Status: DC | PRN

## 2019-01-18 MED ORDER — ACETAMINOPHEN 10 MG/ML IV SOLN
INTRAVENOUS | Status: DC | PRN
  Administered 2019-01-18: 1000 mg via INTRAVENOUS

## 2019-01-18 MED ORDER — PROPOFOL 10 MG/ML IV BOLUS
INTRAVENOUS | Status: DC | PRN
  Administered 2019-01-18: 30 mg via INTRAVENOUS
  Administered 2019-01-18: 150 mg via INTRAVENOUS

## 2019-01-18 MED ORDER — HEPARIN SODIUM (PORCINE) 5000 UNIT/ML IJ SOLN
5000.0000 [IU] | Freq: Three times a day (TID) | INTRAMUSCULAR | Status: DC
  Administered 2019-01-18 – 2019-01-19 (×3): 5000 [IU] via SUBCUTANEOUS
  Filled 2019-01-18 (×3): qty 1

## 2019-01-18 MED ORDER — ONDANSETRON HCL 4 MG/2ML IJ SOLN
INTRAMUSCULAR | Status: DC | PRN
  Administered 2019-01-18: 4 mg via INTRAVENOUS

## 2019-01-18 MED ORDER — DEXAMETHASONE SODIUM PHOSPHATE 10 MG/ML IJ SOLN
INTRAMUSCULAR | Status: AC
  Filled 2019-01-18: qty 1

## 2019-01-18 SURGICAL SUPPLY — 53 items
"PENCIL ELECTRO HAND CTR " (MISCELLANEOUS) ×1 IMPLANT
BANDAGE ELASTIC 4 LF NS (GAUZE/BANDAGES/DRESSINGS) ×3 IMPLANT
BLADE OSC/SAGITTAL MD 5.5X18 (BLADE) ×2 IMPLANT
BLADE OSCILLATING/SAGITTAL (BLADE)
BLADE SURG 15 STRL LF DISP TIS (BLADE) ×1 IMPLANT
BLADE SURG 15 STRL SS (BLADE) ×2
BLADE SW THK.38XMED LNG THN (BLADE) IMPLANT
BNDG CONFORM 2 STRL LF (GAUZE/BANDAGES/DRESSINGS) ×3 IMPLANT
BNDG ESMARK 4X12 TAN STRL LF (GAUZE/BANDAGES/DRESSINGS) ×3 IMPLANT
BNDG GAUZE 4.5X4.1 6PLY STRL (MISCELLANEOUS) ×3 IMPLANT
CANISTER SUCT 1200ML W/VALVE (MISCELLANEOUS) ×3 IMPLANT
COVER WAND RF STERILE (DRAPES) ×3 IMPLANT
CUFF TOURN SGL QUICK 12 (TOURNIQUET CUFF) IMPLANT
CUFF TOURN SGL QUICK 18X4 (TOURNIQUET CUFF) ×2 IMPLANT
DRAPE FLUOR MINI C-ARM 54X84 (DRAPES) IMPLANT
DURAPREP 26ML APPLICATOR (WOUND CARE) ×3 IMPLANT
ELECT REM PT RETURN 9FT ADLT (ELECTROSURGICAL) ×3
ELECTRODE REM PT RTRN 9FT ADLT (ELECTROSURGICAL) ×1 IMPLANT
GAUZE SPONGE 4X4 12PLY STRL (GAUZE/BANDAGES/DRESSINGS) ×3 IMPLANT
GAUZE XEROFORM 1X8 LF (GAUZE/BANDAGES/DRESSINGS) ×3 IMPLANT
GLOVE BIO SURGEON STRL SZ7.5 (GLOVE) ×3 IMPLANT
GLOVE INDICATOR 8.0 STRL GRN (GLOVE) ×3 IMPLANT
GOWN STRL REUS W/ TWL LRG LVL3 (GOWN DISPOSABLE) ×2 IMPLANT
GOWN STRL REUS W/TWL LRG LVL3 (GOWN DISPOSABLE) ×4
HANDPIECE VERSAJET DEBRIDEMENT (MISCELLANEOUS) ×3 IMPLANT
KIT STIMULAN RAPID CURE 5CC (Orthopedic Implant) ×2 IMPLANT
KIT TURNOVER KIT A (KITS) ×3 IMPLANT
LABEL OR SOLS (LABEL) ×3 IMPLANT
NDL FILTER BLUNT 18X1 1/2 (NEEDLE) ×1 IMPLANT
NDL HYPO 25X1 1.5 SAFETY (NEEDLE) ×3 IMPLANT
NEEDLE FILTER BLUNT 18X 1/2SAF (NEEDLE) ×2
NEEDLE FILTER BLUNT 18X1 1/2 (NEEDLE) ×1 IMPLANT
NEEDLE HYPO 25X1 1.5 SAFETY (NEEDLE) ×9 IMPLANT
NS IRRIG 500ML POUR BTL (IV SOLUTION) ×3 IMPLANT
PACK EXTREMITY ARMC (MISCELLANEOUS) ×3 IMPLANT
PAD ABD DERMACEA PRESS 5X9 (GAUZE/BANDAGES/DRESSINGS) ×6 IMPLANT
PENCIL ELECTRO HAND CTR (MISCELLANEOUS) ×3 IMPLANT
RASP SM TEAR CROSS CUT (RASP) IMPLANT
SOL PREP PVP 2OZ (MISCELLANEOUS) ×3
SOLUTION PREP PVP 2OZ (MISCELLANEOUS) ×1 IMPLANT
STOCKINETTE 48X4 2 PLY STRL (GAUZE/BANDAGES/DRESSINGS) ×1 IMPLANT
STOCKINETTE STRL 4IN 9604848 (GAUZE/BANDAGES/DRESSINGS) ×3 IMPLANT
STOCKINETTE STRL 6IN 960660 (GAUZE/BANDAGES/DRESSINGS) ×3 IMPLANT
SUT ETHILON 3-0 FS-10 30 BLK (SUTURE) ×3
SUT ETHILON 4-0 (SUTURE) ×2
SUT ETHILON 4-0 FS2 18XMFL BLK (SUTURE) ×1
SUT VIC AB 3-0 SH 27 (SUTURE) ×2
SUT VIC AB 3-0 SH 27X BRD (SUTURE) ×1 IMPLANT
SUT VIC AB 4-0 FS2 27 (SUTURE) ×3 IMPLANT
SUTURE EHLN 3-0 FS-10 30 BLK (SUTURE) ×1 IMPLANT
SUTURE ETHLN 4-0 FS2 18XMF BLK (SUTURE) ×1 IMPLANT
SYR 10ML LL (SYRINGE) ×6 IMPLANT
SYR 3ML LL SCALE MARK (SYRINGE) ×3 IMPLANT

## 2019-01-18 NOTE — Progress Notes (Addendum)
Pharmacy Antibiotic Note  Laura Reed is a 58 y.o. female admitted on 01/18/2019 with osteomyelitis.  Pharmacy has been consulted for vancomycin and Zosyn dosing.  Plan: Zosyn 3.375g IV q8h (4 hour infusion).   Vancomycin 1000 mg + 500 mg (for total loading dose of 1500 mg) followed by Vancomycin 1500 mg IV Q 24 hrs to start at 0600.  Goal AUC 400-550. Expected AUC: 540.7 SCr used: 1.04  Height: 5\' 7"  (170.2 cm) Weight: 167 lb 1.7 oz (75.8 kg) IBW/kg (Calculated) : 61.6  Temp (24hrs), Avg:98.6 F (37 C), Min:98.6 F (37 C), Max:98.6 F (37 C)  Recent Labs  Lab 01/18/19 1027  WBC 12.8*  CREATININE 1.04*    Estimated Creatinine Clearance: 61.1 mL/min (A) (by C-G formula based on SCr of 1.04 mg/dL (H)).    No Known Allergies  Antimicrobials this admission: vanc 7/14 >>  zosyn 7/14 >>   Dose adjustments this admission: N/A  Microbiology results: 7/14 COVID19: negative  Thank you for allowing pharmacy to be a part of this patient's care.  Tawnya Crook, PharmD 01/18/2019 1:13 PM

## 2019-01-18 NOTE — ED Notes (Signed)
Admitting MD at bedside.

## 2019-01-18 NOTE — Consult Note (Signed)
Reason for Consult: Osteomyelitis left foot. Referring Physician: Posey Pronto.  Laura Reed is an 58 y.o. female.  HPI: This is a 58 year old female well-known to me outpatient with a chronic history of diabetes with neuropathy and history of amputations.  Has had a chronic ulceration on her left foot that has been stable for quite some time with recent downturn extending down to the level of the bone.  In office x-rays revealed osteomyelitis and the decision was made for hospitalization and debridement of the infected bone and soft tissue.  Past Medical History:  Diagnosis Date  . Anxiety   . Bladder cancer ([place])   . CHF (congestive heart failure) ([place])   . Coronary artery disease   . Diabetes mellitus   . Heart murmur   . Hemorrhoid   . Hypertension   . Neuropathy   . PVD (peripheral vascular disease) ([place])   . Thyroid nodule    right  . Urothelial carcinoma of kidney ([place]) 10/31/2014   INVASIVE UROTHELIAL CARCINOMA, LOW GRADE. T1, Nx.    Past Surgical History:  Procedure Laterality Date  . AMPUTATION TOE     right (4th and 5th); left (great toe, 3rd)  . AMPUTATION TOE Right 07/16/2018   Procedure: AMPUTATION TOE/MPJ right 2nd;  Surgeon: Sharlotte Alamo, DPM;  Location: [facility];  Service: Podiatry;  Laterality: Right;  . ARTERIAL BYPASS SURGRY  2009, 2013 x 2   right leg , done in [place]  . CARDIAC CATHETERIZATION    . CAROTID ENDARTERECTOMY Right 01/2014   Dr Delana Meyer  . CATARACT EXTRACTION W/PHACO Right 12/14/2014   Procedure: CATARACT EXTRACTION PHACO AND INTRAOCULAR LENS PLACEMENT (IOC);  Surgeon: Lyla Glassing, MD;  Location: [facility];  Service: Ophthalmology;  Laterality: Right;  [place]   00:38.6              AP        7.1                   CDE  2.76  . CESAREAN SECTION    . CHOLECYSTECTOMY  03-03-12   Porcelain gallbladder, gallstones,  [place]  . COLONOSCOPY W/ BIOPSIES  04/28/2012   Hyperplastic rectal polyps.  . CORONARY ARTERY BYPASS GRAFT  2009   3 vessel  . CYSTOSCOPY  W/ RETROGRADES Right 09/01/2016   Procedure: CYSTOSCOPY WITH RETROGRADE PYELOGRAM;  Surgeon: Hollice Espy, MD;  Location: [facility];  Service: Urology;  Laterality: Right;  . EYE SURGERY    . HERNIA REPAIR  10-31-14   ventral, retro-rectus atrium mesh  . LOWER EXTREMITY ANGIOGRAPHY Left 12/10/2016   Procedure: Lower Extremity Angiography;  Surgeon: Katha Cabal, MD;  Location: [facility];  Service: Cardiovascular;  Laterality: Left;  . LOWER EXTREMITY ANGIOGRAPHY Left 02/02/2018   Procedure: LOWER EXTREMITY ANGIOGRAPHY;  Surgeon: Katha Cabal, MD;  Location: [facility];  Service: Cardiovascular;  Laterality: Left;  . LOWER EXTREMITY ANGIOGRAPHY Left 05/05/2018   Procedure: LOWER EXTREMITY ANGIOGRAPHY;  Surgeon: Katha Cabal, MD;  Location: [facility];  Service: Cardiovascular;  Laterality: Left;  . NEPHRECTOMY Left 10-31-14  . PERIPHERAL VASCULAR CATHETERIZATION Left 05/01/2015   Procedure: Lower Extremity Angiography;  Surgeon: Katha Cabal, MD;  Location: [facility];  Service: Cardiovascular;  Laterality: Left;  . PERIPHERAL VASCULAR CATHETERIZATION  05/01/2015   Procedure: Lower Extremity Intervention;  Surgeon: Katha Cabal, MD;  Location: [facility];  Service: Cardiovascular;;  . PERIPHERAL VASCULAR CATHETERIZATION Left  02/20/2015   Procedure: Pelvic Angiography;  Surgeon: Katha Cabal, MD;  Location: [facility];  Service: Cardiovascular;  Laterality: Left;  . TRANSURETHRAL RESECTION OF BLADDER TUMOR WITH MITOMYCIN-C N/A 09/01/2016   Procedure: TRANSURETHRAL RESECTION OF BLADDER TUMOR WITH MITOMYCIN-C;  Surgeon: Hollice Espy, MD;  Location: [facility];  Service: Urology;  Laterality: N/A;    Family History  Problem Relation Age of Onset  . Cancer Mother 38       Lung Cancer  . Cancer Father 33       Lung Ca  . Diabetes Son   . Breast cancer Maternal Grandmother   . Kidney cancer Neg Hx   .  Bladder Cancer Neg Hx   . Prostate cancer Neg Hx     Social History:  reports that she quit smoking about 7 years ago. Her smoking use included cigarettes. She has a 70.00 pack-year smoking history. She has never used smokeless tobacco. She reports previous drug use. Drug: Cocaine. She reports that she does not drink alcohol.  Allergies: No Known Allergies  Medications:  Scheduled: . insulin aspart  0-5 Units Subcutaneous QHS  . insulin aspart  0-9 Units Subcutaneous TID WC    Results for orders placed or performed during the hospital encounter of 01/18/19 (from the past 48 hour(s))  SARS Coronavirus 2 (CEPHEID - Performed in [facility] hospital lab), Hosp Order     Status: None   Collection Time: 01/18/19 10:20 AM   Specimen: Nasopharyngeal Swab  Result Value Ref Range   SARS Coronavirus 2 NEGATIVE NEGATIVE    Comment: (NOTE) If result is NEGATIVE SARS-CoV-2 target nucleic acids are NOT DETECTED. The SARS-CoV-2 RNA is generally detectable in upper and lower  respiratory specimens during the acute phase of infection. The lowest  concentration of SARS-CoV-2 viral copies this assay can detect is 250  copies / mL. A negative result does not preclude SARS-CoV-2 infection  and should not be used as the sole basis for treatment or other  patient management decisions.  A negative result may occur with  improper specimen collection / handling, submission of specimen other  than nasopharyngeal swab, presence of viral mutation(s) within the  areas targeted by this assay, and inadequate number of viral copies  (<250 copies / mL). A negative result must be combined with clinical  observations, patient history, and epidemiological information. If result is POSITIVE SARS-CoV-2 target nucleic acids are DETECTED. The SARS-CoV-2 RNA is generally detectable in upper and lower  respiratory specimens dur ing the acute phase of infection.  Positive  results are indicative of active infection with  SARS-CoV-2.  Clinical  correlation with patient history and other diagnostic information is  necessary to determine patient infection status.  Positive results do  not rule out bacterial infection or co-infection with other viruses. If result is PRESUMPTIVE POSTIVE SARS-CoV-2 nucleic acids MAY BE PRESENT.   A presumptive positive result was obtained on the submitted specimen  and confirmed on repeat testing.  While 2019 novel coronavirus  (SARS-CoV-2) nucleic acids may be present in the submitted sample  additional confirmatory testing may be necessary for epidemiological  and / or clinical management purposes  to differentiate between  SARS-CoV-2 and other Sarbecovirus currently known to infect humans.  If clinically indicated additional testing with an alternate test  methodology [phone]) is advised. The SARS-CoV-2 RNA is generally  detectable in upper and lower respiratory sp ecimens during the acute  phase of infection. The expected result is Negative.  Fact Sheet for Patients:  StrictlyIdeas.no Fact Sheet for Healthcare Providers: BankingDealers.co.za This test is not yet approved or cleared by the [place] FDA and has been authorized for detection and/or diagnosis of SARS-CoV-2 by FDA under an Emergency Use Authorization (EUA).  This EUA will remain in effect (meaning this test can be used) for the duration of the COVID-19 declaration under Section 564(b)(1) of the Act, 21 U.S.C. section 360bbb-3(b)(1), unless the authorization is terminated or revoked sooner. Performed at [facility], [address] [place], [place] [address]   CBC with Differential/Platelet     Status: Abnormal   Collection Time: 01/18/19 10:27 AM  Result Value Ref Range   WBC 12.8 (H) 4.0 - 10.5 K/uL   RBC 4.07 3.87 - 5.11 MIL/uL   Hemoglobin 11.9 (L) 12.0 - 15.0 g/dL   HCT 34.7 (L) 36.0 - 46.0 %   MCV 85.3 80.0 - 100.0 fL   MCH 29.2 26.0 -  34.0 pg   MCHC 34.3 30.0 - 36.0 g/dL   RDW 12.8 11.5 - 15.5 %   Platelets 277 150 - 400 K/uL   nRBC 0.0 0.0 - 0.2 %   Neutrophils Relative % 76 %   Neutro Abs 9.9 (H) 1.7 - 7.7 K/uL   Lymphocytes Relative 13 %   Lymphs Abs 1.6 0.7 - 4.0 K/uL   Monocytes Relative 7 %   Monocytes Absolute 0.9 0.1 - 1.0 K/uL   Eosinophils Relative 2 %   Eosinophils Absolute 0.2 0.0 - 0.5 K/uL   Basophils Relative 1 %   Basophils Absolute 0.1 0.0 - 0.1 K/uL   Immature Granulocytes 1 %   Abs Immature Granulocytes 0.08 (H) 0.00 - 0.07 K/uL    Comment: Performed at [facility], [address] [place], [place] [address]  Comprehensive metabolic panel     Status: Abnormal   Collection Time: 01/18/19 10:27 AM  Result Value Ref Range   Sodium 135 135 - 145 mmol/L   Potassium 4.1 3.5 - 5.1 mmol/L   Chloride 104 98 - 111 mmol/L   CO2 22 22 - 32 mmol/L   Glucose, Bld 160 (H) 70 - 99 mg/dL   BUN 33 (H) 6 - 20 mg/dL   Creatinine, Ser 1.04 (H) 0.44 - 1.00 mg/dL   Calcium 8.8 (L) 8.9 - 10.3 mg/dL   Total Protein 7.2 6.5 - 8.1 g/dL   Albumin 3.6 3.5 - 5.0 g/dL   AST 17 15 - 41 U/L   ALT 15 0 - 44 U/L   Alkaline Phosphatase 88 38 - 126 U/L   Total Bilirubin 0.6 0.3 - 1.2 mg/dL   GFR calc non Af Amer 58 (L) >60 mL/min   GFR calc Af Amer >60 >60 mL/min   Anion gap 9 5 - 15    Comment: Performed at [facility], [address] [place], [place] [address]    No results found.  Review of Systems  Constitutional: Negative for chills and fever.  HENT: Negative for congestion and sinus pain.   Eyes: Negative for blurred vision and double vision.  Respiratory: Negative for cough and shortness of breath.   Cardiovascular: Negative for chest pain and palpitations.  Gastrointestinal: Negative for nausea and vomiting.  Genitourinary: Negative for dysuria and urgency.  Musculoskeletal: Negative for back pain and myalgias.  Skin:       Chronic draining ulcer on the plantar aspect of her left  foot.  Recently has gotten worse.  Neurological:  Patient has significant diabetic neuropathy.  Endo/Heme/Allergies: Does not bruise/bleed easily.  Psychiatric/Behavioral: Negative for depression. The patient is not nervous/anxious.    Blood pressure (!) 157/78, pulse 93, temperature 98.6 F (37 C), temperature source Oral, height 5\' 7"  (1.702 m), weight 75.8 kg, SpO2 99 %. Physical Exam  Cardiovascular:  DP and PT pulses are diminished but palpable.  Musculoskeletal:     Comments: Previous amputation of the left first and third toes.  Residual hammertoe contractures.  Neurological:  Complete loss of sensation in the feet.  Skin:  Chronic full-thickness ulceration on the plantar aspect of the left forefoot.  Probes down to the level of the bone.  Some erythema and edema noted.    Assessment/Plan: Assessment: 1.  Osteomyelitis left foot. 2.  Diabetes with associated neuropathy. 3.  Peripheral vascular disease.  Plan: Patient has full understanding of the need for debridement of the infected bone on the left foot.  We did discuss potential risks and complications of the procedure including inability to heal related to her diabetes, circulation, or extent of infection.  No guarantees could be given.  Questions were invited and answered.  Patient elects to proceed with surgery for debridement of the infected bone on the left foot.  Obtain consent for I&D left foot.  Patient is currently n.p.o.  Plan for surgery later this evening.  Durward Fortes 01/18/2019, 2:48 PM

## 2019-01-18 NOTE — Anesthesia Post-op Follow-up Note (Signed)
Anesthesia QCDR form completed.        

## 2019-01-18 NOTE — H&P (View-Only) (Signed)
Reason for Consult: Osteomyelitis left foot. Referring Physician: Posey Pronto.  Laura Reed is an 58 y.o. female.  HPI: This is a 58 year old female well-known to me outpatient with a chronic history of diabetes with neuropathy and history of amputations.  Has had a chronic ulceration on her left foot that has been stable for quite some time with recent downturn extending down to the level of the bone.  In office x-rays revealed osteomyelitis and the decision was made for hospitalization and debridement of the infected bone and soft tissue.  Past Medical History:  Diagnosis Date  . Anxiety   . Bladder cancer ([place])   . CHF (congestive heart failure) ([place])   . Coronary artery disease   . Diabetes mellitus   . Heart murmur   . Hemorrhoid   . Hypertension   . Neuropathy   . PVD (peripheral vascular disease) ([place])   . Thyroid nodule    right  . Urothelial carcinoma of kidney ([place]) 10/31/2014   INVASIVE UROTHELIAL CARCINOMA, LOW GRADE. T1, Nx.    Past Surgical History:  Procedure Laterality Date  . AMPUTATION TOE     right (4th and 5th); left (great toe, 3rd)  . AMPUTATION TOE Right 07/16/2018   Procedure: AMPUTATION TOE/MPJ right 2nd;  Surgeon: Sharlotte Alamo, DPM;  Location: [facility];  Service: Podiatry;  Laterality: Right;  . ARTERIAL BYPASS SURGRY  2009, 2013 x 2   right leg , done in [place]  . CARDIAC CATHETERIZATION    . CAROTID ENDARTERECTOMY Right 01/2014   Dr Delana Meyer  . CATARACT EXTRACTION W/PHACO Right 12/14/2014   Procedure: CATARACT EXTRACTION PHACO AND INTRAOCULAR LENS PLACEMENT (IOC);  Surgeon: Lyla Glassing, MD;  Location: [facility];  Service: Ophthalmology;  Laterality: Right;  [place]   00:38.6              AP        7.1                   CDE  2.76  . CESAREAN SECTION    . CHOLECYSTECTOMY  03-03-12   Porcelain gallbladder, gallstones,  [place]  . COLONOSCOPY W/ BIOPSIES  04/28/2012   Hyperplastic rectal polyps.  . CORONARY ARTERY BYPASS GRAFT  2009   3 vessel  . CYSTOSCOPY  W/ RETROGRADES Right 09/01/2016   Procedure: CYSTOSCOPY WITH RETROGRADE PYELOGRAM;  Surgeon: Hollice Espy, MD;  Location: [facility];  Service: Urology;  Laterality: Right;  . EYE SURGERY    . HERNIA REPAIR  10-31-14   ventral, retro-rectus atrium mesh  . LOWER EXTREMITY ANGIOGRAPHY Left 12/10/2016   Procedure: Lower Extremity Angiography;  Surgeon: Katha Cabal, MD;  Location: [place] [facility];  Service: Cardiovascular;  Laterality: Left;  . LOWER EXTREMITY ANGIOGRAPHY Left 02/02/2018   Procedure: LOWER EXTREMITY ANGIOGRAPHY;  Surgeon: Katha Cabal, MD;  Location: [place] [facility];  Service: Cardiovascular;  Laterality: Left;  . LOWER EXTREMITY ANGIOGRAPHY Left 05/05/2018   Procedure: LOWER EXTREMITY ANGIOGRAPHY;  Surgeon: Katha Cabal, MD;  Location: [place] [facility];  Service: Cardiovascular;  Laterality: Left;  . NEPHRECTOMY Left 10-31-14  . PERIPHERAL VASCULAR CATHETERIZATION Left 05/01/2015   Procedure: Lower Extremity Angiography;  Surgeon: Katha Cabal, MD;  Location: [place] [facility];  Service: Cardiovascular;  Laterality: Left;  . PERIPHERAL VASCULAR CATHETERIZATION  05/01/2015   Procedure: Lower Extremity Intervention;  Surgeon: Katha Cabal, MD;  Location: [facility] [facility];  Service: Cardiovascular;;  . PERIPHERAL VASCULAR CATHETERIZATION Left  02/20/2015   Procedure: Pelvic Angiography;  Surgeon: Katha Cabal, MD;  Location: [place] [facility];  Service: Cardiovascular;  Laterality: Left;  . TRANSURETHRAL RESECTION OF BLADDER TUMOR WITH MITOMYCIN-C N/A 09/01/2016   Procedure: TRANSURETHRAL RESECTION OF BLADDER TUMOR WITH MITOMYCIN-C;  Surgeon: Hollice Espy, MD;  Location: [facility];  Service: Urology;  Laterality: N/A;    Family History  Problem Relation Age of Onset  . Cancer Mother 61       Lung Cancer  . Cancer Father 42       Lung Ca  . Diabetes Son   . Breast cancer Maternal Grandmother   . Kidney cancer Neg Hx   .  Bladder Cancer Neg Hx   . Prostate cancer Neg Hx     Social History:  reports that she quit smoking about 7 years ago. Her smoking use included cigarettes. She has a 70.00 pack-year smoking history. She has never used smokeless tobacco. She reports previous drug use. Drug: Cocaine. She reports that she does not drink alcohol.  Allergies: No Known Allergies  Medications:  Scheduled: . insulin aspart  0-5 Units Subcutaneous QHS  . insulin aspart  0-9 Units Subcutaneous TID WC    Results for orders placed or performed during the hospital encounter of 01/18/19 (from the past 48 hour(s))  SARS Coronavirus 2 (CEPHEID - Performed in [facility] hospital lab), Hosp Order     Status: None   Collection Time: 01/18/19 10:20 AM   Specimen: Nasopharyngeal Swab  Result Value Ref Range   SARS Coronavirus 2 NEGATIVE NEGATIVE    Comment: (NOTE) If result is NEGATIVE SARS-CoV-2 target nucleic acids are NOT DETECTED. The SARS-CoV-2 RNA is generally detectable in upper and lower  respiratory specimens during the acute phase of infection. The lowest  concentration of SARS-CoV-2 viral copies this assay can detect is 250  copies / mL. A negative result does not preclude SARS-CoV-2 infection  and should not be used as the sole basis for treatment or other  patient management decisions.  A negative result may occur with  improper specimen collection / handling, submission of specimen other  than nasopharyngeal swab, presence of viral mutation(s) within the  areas targeted by this assay, and inadequate number of viral copies  (<250 copies / mL). A negative result must be combined with clinical  observations, patient history, and epidemiological information. If result is POSITIVE SARS-CoV-2 target nucleic acids are DETECTED. The SARS-CoV-2 RNA is generally detectable in upper and lower  respiratory specimens dur ing the acute phase of infection.  Positive  results are indicative of active infection with  SARS-CoV-2.  Clinical  correlation with patient history and other diagnostic information is  necessary to determine patient infection status.  Positive results do  not rule out bacterial infection or co-infection with other viruses. If result is PRESUMPTIVE POSTIVE SARS-CoV-2 nucleic acids MAY BE PRESENT.   A presumptive positive result was obtained on the submitted specimen  and confirmed on repeat testing.  While 2019 novel coronavirus  (SARS-CoV-2) nucleic acids may be present in the submitted sample  additional confirmatory testing may be necessary for epidemiological  and / or clinical management purposes  to differentiate between  SARS-CoV-2 and other Sarbecovirus currently known to infect humans.  If clinically indicated additional testing with an alternate test  methodology [phone]) is advised. The SARS-CoV-2 RNA is generally  detectable in upper and lower respiratory sp ecimens during the acute  phase of infection. The expected result is Negative.  Fact Sheet for Patients:  StrictlyIdeas.no Fact Sheet for Healthcare Providers: BankingDealers.co.za This test is not yet approved or cleared by the [place] FDA and has been authorized for detection and/or diagnosis of SARS-CoV-2 by FDA under an Emergency Use Authorization (EUA).  This EUA will remain in effect (meaning this test can be used) for the duration of the COVID-19 declaration under Section 564(b)(1) of the Act, 21 U.S.C. section 360bbb-3(b)(1), unless the authorization is terminated or revoked sooner. Performed at [facility], [address] [place], [place] [address]   CBC with Differential/Platelet     Status: Abnormal   Collection Time: 01/18/19 10:27 AM  Result Value Ref Range   WBC 12.8 (H) 4.0 - 10.5 K/uL   RBC 4.07 3.87 - 5.11 MIL/uL   Hemoglobin 11.9 (L) 12.0 - 15.0 g/dL   HCT 34.7 (L) 36.0 - 46.0 %   MCV 85.3 80.0 - 100.0 fL   MCH 29.2 26.0 -  34.0 pg   MCHC 34.3 30.0 - 36.0 g/dL   RDW 12.8 11.5 - 15.5 %   Platelets 277 150 - 400 K/uL   nRBC 0.0 0.0 - 0.2 %   Neutrophils Relative % 76 %   Neutro Abs 9.9 (H) 1.7 - 7.7 K/uL   Lymphocytes Relative 13 %   Lymphs Abs 1.6 0.7 - 4.0 K/uL   Monocytes Relative 7 %   Monocytes Absolute 0.9 0.1 - 1.0 K/uL   Eosinophils Relative 2 %   Eosinophils Absolute 0.2 0.0 - 0.5 K/uL   Basophils Relative 1 %   Basophils Absolute 0.1 0.0 - 0.1 K/uL   Immature Granulocytes 1 %   Abs Immature Granulocytes 0.08 (H) 0.00 - 0.07 K/uL    Comment: Performed at [facility] [address] [place], [place] [address]  Comprehensive metabolic panel     Status: Abnormal   Collection Time: 01/18/19 10:27 AM  Result Value Ref Range   Sodium 135 135 - 145 mmol/L   Potassium 4.1 3.5 - 5.1 mmol/L   Chloride 104 98 - 111 mmol/L   CO2 22 22 - 32 mmol/L   Glucose, Bld 160 (H) 70 - 99 mg/dL   BUN 33 (H) 6 - 20 mg/dL   Creatinine, Ser 1.04 (H) 0.44 - 1.00 mg/dL   Calcium 8.8 (L) 8.9 - 10.3 mg/dL   Total Protein 7.2 6.5 - 8.1 g/dL   Albumin 3.6 3.5 - 5.0 g/dL   AST 17 15 - 41 U/L   ALT 15 0 - 44 U/L   Alkaline Phosphatase 88 38 - 126 U/L   Total Bilirubin 0.6 0.3 - 1.2 mg/dL   GFR calc non Af Amer 58 (L) >60 mL/min   GFR calc Af Amer >60 >60 mL/min   Anion gap 9 5 - 15    Comment: Performed at [facility], [address], [place], [place] [address]    No results found.  Review of Systems  Constitutional: Negative for chills and fever.  HENT: Negative for congestion and sinus pain.   Eyes: Negative for blurred vision and double vision.  Respiratory: Negative for cough and shortness of breath.   Cardiovascular: Negative for chest pain and palpitations.  Gastrointestinal: Negative for nausea and vomiting.  Genitourinary: Negative for dysuria and urgency.  Musculoskeletal: Negative for back pain and myalgias.  Skin:       Chronic draining ulcer on the plantar aspect of her left  foot.  Recently has gotten worse.  Neurological:  Patient has significant diabetic neuropathy.  Endo/Heme/Allergies: Does not bruise/bleed easily.  Psychiatric/Behavioral: Negative for depression. The patient is not nervous/anxious.    Blood pressure (!) 157/78, pulse 93, temperature 98.6 F (37 C), temperature source Oral, height 5\' 7"  (1.702 m), weight 75.8 kg, SpO2 99 %. Physical Exam  Cardiovascular:  DP and PT pulses are diminished but palpable.  Musculoskeletal:     Comments: Previous amputation of the left first and third toes.  Residual hammertoe contractures.  Neurological:  Complete loss of sensation in the feet.  Skin:  Chronic full-thickness ulceration on the plantar aspect of the left forefoot.  Probes down to the level of the bone.  Some erythema and edema noted.    Assessment/Plan: Assessment: 1.  Osteomyelitis left foot. 2.  Diabetes with associated neuropathy. 3.  Peripheral vascular disease.  Plan: Patient has full understanding of the need for debridement of the infected bone on the left foot.  We did discuss potential risks and complications of the procedure including inability to heal related to her diabetes, circulation, or extent of infection.  No guarantees could be given.  Questions were invited and answered.  Patient elects to proceed with surgery for debridement of the infected bone on the left foot.  Obtain consent for I&D left foot.  Patient is currently n.p.o.  Plan for surgery later this evening.  Durward Fortes 01/18/2019, 2:48 PM

## 2019-01-18 NOTE — Anesthesia Preprocedure Evaluation (Signed)
Anesthesia Evaluation  Patient identified by MRN, date of birth, ID band Patient awake    Reviewed: Allergy & Precautions, NPO status , Patient's Chart, lab work & pertinent test results  History of Anesthesia Complications Negative for: history of anesthetic complications  Airway Mallampati: III       Dental  (+) Lower Dentures, Upper Dentures   Pulmonary neg sleep apnea, neg COPD, former smoker,           Cardiovascular hypertension, Pt. on medications + CAD, + CABG and + Peripheral Vascular Disease  (-) dysrhythmias (-) Valvular Problems/Murmurs     Neuro/Psych neg Seizures Anxiety    GI/Hepatic Neg liver ROS, neg GERD  ,  Endo/Other  diabetes, Type 2, Oral Hypoglycemic Agents  Renal/GU Renal disease (s/p nephrectomy for bladder cancer)     Musculoskeletal   Abdominal   Peds  Hematology   Anesthesia Other Findings   Reproductive/Obstetrics                             Anesthesia Physical Anesthesia Plan  ASA: III and emergent  Anesthesia Plan:    Post-op Pain Management:    Induction: Intravenous  PONV Risk Score and Plan: 2 and Ondansetron and Midazolam  Airway Management Planned: LMA  Additional Equipment:   Intra-op Plan:   Post-operative Plan:   Informed Consent: I have reviewed the patients History and Physical, chart, labs and discussed the procedure including the risks, benefits and alternatives for the proposed anesthesia with the patient or authorized representative who has indicated his/her understanding and acceptance.       Plan Discussed with:   Anesthesia Plan Comments:         Anesthesia Quick Evaluation

## 2019-01-18 NOTE — ED Triage Notes (Signed)
Patient was to be a direct admit to the hospital for foot surgery.  Dr. Neal Dy had spoken to Dr. Jens Som about admission.  Patient needs a preoperative COVID test.

## 2019-01-18 NOTE — ED Provider Notes (Addendum)
[facility] Emergency Department Provider Note       Time seen: ----------------------------------------- 10:21 AM on 01/18/2019 -----------------------------------------   I have reviewed the triage vital signs and the nursing notes.  HISTORY   Chief Complaint admission    HPI Laura Reed is a 58 y.o. female with a history of anxiety, bladder cancer, CHF, coronary artery disease diabetes, hypertension, neuropathy, peripheral vascular disease who presents to the ED for osteomyelitis.  Reportedly she was to be a direct admit after osteomyelitis was discovered on outpatient x-ray but they had no efficient way of doing coronavirus testing prior to foot surgery.  She denies fevers or acute chills, has neuropathy so her left foot typically does not cause her pain.  She has had chronic ulcerations on the plantar surface of the left foot.  Past Medical History:  Diagnosis Date  . Anxiety   . Bladder cancer ([place])   . CHF (congestive heart failure) ([place])   . Coronary artery disease   . Diabetes mellitus   . Heart murmur   . Hemorrhoid   . Hypertension   . Neuropathy   . PVD (peripheral vascular disease) ([place])   . Thyroid nodule    right  . Urothelial carcinoma of kidney ([place]) 10/31/2014   INVASIVE UROTHELIAL CARCINOMA, LOW GRADE. T1, Nx.    Patient Active Problem List   Diagnosis Date Noted  . Bradycardia, drug induced 09/13/2017  . Anxiety state 04/01/2017  . Atherosclerosis of native arteries of extremity with intermittent claudication ([place]) 01/18/2017  . Carotid stenosis 12/04/2016  . Sebaceous cyst 06/17/2016  . Insomnia secondary to anxiety 02/21/2016  . Thyroid nodule 02/21/2016  . Hospital discharge follow-up 11/11/2015  . Encounter for preventive health examination 08/02/2015  . S/p nephrectomy 05/15/2015  . Diabetic nephropathy with proteinuria ([place]) 01/27/2015  . Overweight (BMI 25.0-29.9) 10/07/2014  . Renal cancer ([place]) 09/28/2014   . CAD (coronary artery disease) 08/24/2014  . Hernia of abdominal cavity 08/10/2014  . Hyperlipidemia associated with type 2 diabetes mellitus ([place]) 06/17/2014  . S/P carotid endarterectomy 01/22/2014  . History of shingles 05/18/2013  . Preoperative evaluation of a medical condition to rule out surgical contraindications (TAR required) 04/06/2013  . Diabetic peripheral neuropathy associated with type 2 diabetes mellitus ([place]) 04/06/2013  . Chronic hip pain 01/16/2013  . Routine general medical examination at a health care facility 06/26/2012  . PVD (peripheral vascular disease) ([place])   . History of tobacco abuse 04/29/2011  . Controlled type 2 DM with peripheral circulatory disorder ([place])   . Hypertension   . Hemorrhoid     Past Surgical History:  Procedure Laterality Date  . AMPUTATION TOE     right (4th and 5th); left (great toe, 3rd)  . AMPUTATION TOE Right 07/16/2018   Procedure: AMPUTATION TOE/MPJ right 2nd;  Surgeon: Sharlotte Alamo, DPM;  Location: [facility];  Service: Podiatry;  Laterality: Right;  . ARTERIAL BYPASS SURGRY  2009, 2013 x 2   right leg , done in [place]  . CARDIAC CATHETERIZATION    . CAROTID ENDARTERECTOMY Right 01/2014   Dr Delana Meyer  . CATARACT EXTRACTION W/PHACO Right 12/14/2014   Procedure: CATARACT EXTRACTION PHACO AND INTRAOCULAR LENS PLACEMENT (IOC);  Surgeon: Lyla Glassing, MD;  Location: [facility];  Service: Ophthalmology;  Laterality: Right;  [place]   00:38.6              AP        7.1  CDE  2.76  . CESAREAN SECTION    . CHOLECYSTECTOMY  03-03-12   Porcelain gallbladder, gallstones,  [place]  . COLONOSCOPY W/ BIOPSIES  04/28/2012   Hyperplastic rectal polyps.  . CORONARY ARTERY BYPASS GRAFT  2009   3 vessel  . CYSTOSCOPY W/ RETROGRADES Right 09/01/2016   Procedure: CYSTOSCOPY WITH RETROGRADE PYELOGRAM;  Surgeon: Hollice Espy, MD;  Location: [facility];  Service: Urology;  Laterality: Right;  . EYE SURGERY    . HERNIA REPAIR  10-31-14    ventral, retro-rectus atrium mesh  . LOWER EXTREMITY ANGIOGRAPHY Left 12/10/2016   Procedure: Lower Extremity Angiography;  Surgeon: Katha Cabal, MD;  Location: [place] [facility];  Service: Cardiovascular;  Laterality: Left;  . LOWER EXTREMITY ANGIOGRAPHY Left 02/02/2018   Procedure: LOWER EXTREMITY ANGIOGRAPHY;  Surgeon: Katha Cabal, MD;  Location: [place] [facility];  Service: Cardiovascular;  Laterality: Left;  . LOWER EXTREMITY ANGIOGRAPHY Left 05/05/2018   Procedure: LOWER EXTREMITY ANGIOGRAPHY;  Surgeon: Katha Cabal, MD;  Location: [place] [facility];  Service: Cardiovascular;  Laterality: Left;  . NEPHRECTOMY Left 10-31-14  . PERIPHERAL VASCULAR CATHETERIZATION Left 05/01/2015   Procedure: Lower Extremity Angiography;  Surgeon: Katha Cabal, MD;  Location: [place] [facility];  Service: Cardiovascular;  Laterality: Left;  . PERIPHERAL VASCULAR CATHETERIZATION  05/01/2015   Procedure: Lower Extremity Intervention;  Surgeon: Katha Cabal, MD;  Location: [place] [facility];  Service: Cardiovascular;;  . PERIPHERAL VASCULAR CATHETERIZATION Left 02/20/2015   Procedure: Pelvic Angiography;  Surgeon: Katha Cabal, MD;  Location: [place] [facility];  Service: Cardiovascular;  Laterality: Left;  . TRANSURETHRAL RESECTION OF BLADDER TUMOR WITH MITOMYCIN-C N/A 09/01/2016   Procedure: TRANSURETHRAL RESECTION OF BLADDER TUMOR WITH MITOMYCIN-C;  Surgeon: Hollice Espy, MD;  Location: [facility];  Service: Urology;  Laterality: N/A;    Allergies Patient has no known allergies.  Social History Social History   Tobacco Use  . Smoking status: Former Smoker    Packs/day: 2.00    Years: 35.00    Pack years: 70.00    Types: Cigarettes    Quit date: 03/30/2011    Years since quitting: 7.8  . Smokeless tobacco: Never Used  Substance Use Topics  . Alcohol use: No    Alcohol/week: 0.0 standard drinks  . Drug use: Not Currently    Types: Cocaine    Comment:  last used in 2007    Review of Systems Constitutional: Negative for fever. Cardiovascular: Negative for chest pain. Respiratory: Negative for shortness of breath. Gastrointestinal: Negative for abdominal pain, vomiting and diarrhea. Musculoskeletal: Positive for mild left foot pain Skin: Negative for rash. Neurological: Negative for headaches, focal weakness or numbness.  All systems negative/normal/unremarkable except as stated in the HPI  ____________________________________________   PHYSICAL EXAM:  VITAL SIGNS: ED Triage Vitals  Enc Vitals Group     BP 01/18/19 0950 (!) 157/78     Pulse Rate 01/18/19 0950 93     Resp --      Temp 01/18/19 0950 98.6 F (37 C)     Temp Source 01/18/19 0950 Oral     SpO2 01/18/19 0950 99 %     Weight 01/18/19 0950 167 lb (75.8 kg)     Height 01/18/19 0950 5\' 7"  (1.702 m)     Head Circumference --      Peak Flow --      Pain Score 01/18/19 1013 3     Pain Loc --  Pain Edu? --      Excl. in [place]? --    Constitutional: Alert and oriented. Well appearing and in no distress. Eyes: Conjunctivae are normal. Normal extraocular movements. Cardiovascular: Normal rate, regular rhythm. No murmurs, rubs, or gallops. Respiratory: Normal respiratory effort without tachypnea nor retractions. Breath sounds are clear and equal bilaterally. No wheezes/rales/rhonchi. Gastrointestinal: Soft and nontender. Normal bowel sounds Musculoskeletal: There is an ulceration on the plantar surface of the left foot distally, no obvious drainage or swelling is appreciated.  No focal tenderness is appreciated Neurologic:  Normal speech and language. No gross focal neurologic deficits are appreciated.  Skin:  Skin is warm, dry and intact. No rash noted. Psychiatric: Mood and affect are normal. Speech and behavior are normal.  ____________________________________________  ED COURSE:  As part of my medical decision making, I reviewed the following data within the  [place] History obtained from family if available, nursing notes, old chart and ekg, as well as notes from prior ED visits. Patient presented for left foot ulceration and osteomyelitis, we will assess with labs and imaging as indicated at this time.   Procedures  Laura Reed was evaluated in Emergency Department on 01/18/2019 for the symptoms described in the history of present illness. She was evaluated in the context of the global COVID-19 pandemic, which necessitated consideration that the patient might be at risk for infection with the SARS-CoV-2 virus that causes COVID-19. Institutional protocols and algorithms that pertain to the evaluation of patients at risk for COVID-19 are in a state of rapid change based on information released by regulatory bodies including the CDC and federal and state organizations. These policies and algorithms were followed during the patient's care in the ED.  ____________________________________________   LABS (pertinent positives/negatives)  Labs Reviewed  CBC WITH DIFFERENTIAL/PLATELET - Abnormal; Notable for the following components:      Result Value   WBC 12.8 (*)    Hemoglobin 11.9 (*)    HCT 34.7 (*)    Neutro Abs 9.9 (*)    Abs Immature Granulocytes 0.08 (*)    All other components within normal limits  COMPREHENSIVE METABOLIC PANEL - Abnormal; Notable for the following components:   Glucose, Bld 160 (*)    BUN 33 (*)    Creatinine, Ser 1.04 (*)    Calcium 8.8 (*)    GFR calc non Af Amer 58 (*)    All other components within normal limits  SARS CORONAVIRUS 2 (HOSPITAL ORDER, [place] [facility])    RADIOLOGY  Left foot xray from 7/13 : DP, oblique, and lateral of the left foot taken today and reviewed by  myself reveals osteolytic destruction of the third metatarsal head  consistent with osteomyelitis. Significant interval change from previous  films taken about 5 years  ago  ____________________________________________   DIFFERENTIAL DIAGNOSIS   Osteomyelitis, diabetic foot ulceration  FINAL ASSESSMENT AND PLAN  Osteomyelitis, foot ulceration   Plan: The patient had presented for admission regarding osteomyelitis. Patient's labs are grossly unremarkable. Patient's imaging did reveal osteolytic destruction of the third metatarsal head consistent with osteomyelitis.  I have ordered vancomycin and Zosyn.  Coronavirus testing has been performed.  I will discuss with the hospitalist for admission.   Laurence Aly, MD    Note: This note was generated in part or whole with voice recognition software. Voice recognition is usually quite accurate but there are transcription errors that can and very often do occur. I apologize  for any typographical errors that were not detected and corrected.     Earleen Newport, MD 01/18/19 1042    Earleen Newport, MD 01/18/19 [phone]

## 2019-01-18 NOTE — Transfer of Care (Signed)
Immediate Anesthesia Transfer of Care Note  Patient: Laura Reed  Procedure(s) Performed: Procedure(s): IRRIGATION AND DEBRIDEMENT FOOT (Left)  Patient Location: PACU  Anesthesia Type:General  Level of Consciousness: sedated  Airway & Oxygen Therapy: Patient Spontanous Breathing and Patient connected to face mask oxygen  Post-op Assessment: Report given to RN and Post -op Vital signs reviewed and stable  Post vital signs: Reviewed and stable  Last Vitals:  Vitals:   01/18/19 1700 01/18/19 1824  BP: 138/67 (!) 115/46  Pulse: 69 89  Resp:  11  Temp:  36.7 C  SpO2: 86% 16%    Complications: No apparent anesthesia complications

## 2019-01-18 NOTE — ED Notes (Signed)
OR RN at bedside. Pt placed in hospital gown. Belongings bagged at this time.

## 2019-01-18 NOTE — Anesthesia Procedure Notes (Signed)
Procedure Name: LMA Insertion Date/Time: 01/18/2019 5:36 PM Performed by: Doreen Salvage, CRNA Pre-anesthesia Checklist: Patient identified, Patient being monitored, Timeout performed, Emergency Drugs available and Suction available Patient Re-evaluated:Patient Re-evaluated prior to induction Oxygen Delivery Method: Circle system utilized Preoxygenation: Pre-oxygenation with 100% oxygen Induction Type: IV induction Ventilation: Mask ventilation without difficulty LMA: LMA inserted LMA Size: 4.0 Tube type: Oral Number of attempts: 1 Placement Confirmation: positive ETCO2 and breath sounds checked- equal and bilateral Tube secured with: Tape Dental Injury: Teeth and Oropharynx as per pre-operative assessment

## 2019-01-18 NOTE — Interval H&P Note (Signed)
History and Physical Interval Note:  01/18/2019 4:56 PM  Laura Reed  has presented today for surgery, with the diagnosis of n/a.  The various methods of treatment have been discussed with the patient and family. After consideration of risks, benefits and other options for treatment, the patient has consented to  Procedure(s): IRRIGATION AND DEBRIDEMENT FOOT (Left) as a surgical intervention.  The patient's history has been reviewed, patient examined, no change in status, stable for surgery.  I have reviewed the patient's chart and labs.  Questions were answered to the patient's satisfaction.     Durward Fortes

## 2019-01-18 NOTE — H&P (Addendum)
[place] at [place] NAME: Laura Reed    MR#:  [id number]  DATE OF BIRTH:  [birth year]  DATE OF ADMISSION:  01/18/2019  PRIMARY CARE PHYSICIAN: [name], MD   REQUESTING/REFERRING PHYSICIAN:   CHIEF COMPLAINT:   Chief Complaint  Patient presents with  . admission    HISTORY OF PRESENT ILLNESS: Laura Reed  is a 58 y.o. female with a known history of bladder cancer, congestive heart failure, coronary artery disease, diabetes type 2, essential hypertension, peripheral vascular disease who has a chronic wound on the left foot.  She was seen by her podiatrist yesterday and had x-ray which suggested osteomyelitis of the third metatarsal.  Patient was recommended to be admitted to the hospital for IV antibiotics and likely surgery tomorrow.  She denies any fevers chills no cough. PAST MEDICAL HISTORY:   Past Medical History:  Diagnosis Date  . Anxiety   . Bladder cancer ([place])   . CHF (congestive heart failure) ([place])   . Coronary artery disease   . Diabetes mellitus   . Heart murmur   . Hemorrhoid   . Hypertension   . Neuropathy   . PVD (peripheral vascular disease) ([place])   . Thyroid nodule    right  . Urothelial carcinoma of kidney ([place]) 10/31/2014   INVASIVE UROTHELIAL CARCINOMA, LOW GRADE. T1, Nx.    PAST SURGICAL HISTORY:  Past Surgical History:  Procedure Laterality Date  . AMPUTATION TOE     right (4th and 5th); left (great toe, 3rd)  . AMPUTATION TOE Right 07/16/2018   Procedure: AMPUTATION TOE/MPJ right 2nd;  Surgeon: Sharlotte Alamo, DPM;  Location: [facility] ORS;  Service: Podiatry;  Laterality: Right;  . ARTERIAL BYPASS SURGRY  2009, 2013 x 2   right leg , done in [place]  . CARDIAC CATHETERIZATION    . CAROTID ENDARTERECTOMY Right 01/2014   Dr Delana Meyer  . CATARACT EXTRACTION W/PHACO Right 12/14/2014   Procedure: CATARACT EXTRACTION PHACO AND INTRAOCULAR LENS PLACEMENT (IOC);  Surgeon: Lyla Glassing, MD;  Location: [facility]  ORS;  Service: Ophthalmology;  Laterality: Right;  [place]   00:38.6              AP        7.1                   CDE  2.76  . CESAREAN SECTION    . CHOLECYSTECTOMY  03-03-12   Porcelain gallbladder, gallstones,  [place]  . COLONOSCOPY W/ BIOPSIES  04/28/2012   Hyperplastic rectal polyps.  . CORONARY ARTERY BYPASS GRAFT  2009   3 vessel  . CYSTOSCOPY W/ RETROGRADES Right 09/01/2016   Procedure: CYSTOSCOPY WITH RETROGRADE PYELOGRAM;  Surgeon: Hollice Espy, MD;  Location: [facility] ORS;  Service: Urology;  Laterality: Right;  . EYE SURGERY    . HERNIA REPAIR  10-31-14   ventral, retro-rectus atrium mesh  . LOWER EXTREMITY ANGIOGRAPHY Left 12/10/2016   Procedure: Lower Extremity Angiography;  Surgeon: Katha Cabal, MD;  Location: [place] [facility];  Service: Cardiovascular;  Laterality: Left;  . LOWER EXTREMITY ANGIOGRAPHY Left 02/02/2018   Procedure: LOWER EXTREMITY ANGIOGRAPHY;  Surgeon: Katha Cabal, MD;  Location: [place] [facility] LAB;  Service: Cardiovascular;  Laterality: Left;  . LOWER EXTREMITY ANGIOGRAPHY Left 05/05/2018   Procedure: LOWER EXTREMITY ANGIOGRAPHY;  Surgeon: Katha Cabal, MD;  Location: [place] [facility] LAB;  Service: Cardiovascular;  Laterality: Left;  . NEPHRECTOMY Left 10-31-14  .  PERIPHERAL VASCULAR CATHETERIZATION Left 05/01/2015   Procedure: Lower Extremity Angiography;  Surgeon: Katha Cabal, MD;  Location: [place] CV LAB;  Service: Cardiovascular;  Laterality: Left;  . PERIPHERAL VASCULAR CATHETERIZATION  05/01/2015   Procedure: Lower Extremity Intervention;  Surgeon: Katha Cabal, MD;  Location: [place] CV LAB;  Service: Cardiovascular;;  . PERIPHERAL VASCULAR CATHETERIZATION Left 02/20/2015   Procedure: Pelvic Angiography;  Surgeon: Katha Cabal, MD;  Location: [place] CV LAB;  Service: Cardiovascular;  Laterality: Left;  . TRANSURETHRAL RESECTION OF BLADDER TUMOR WITH MITOMYCIN-C N/A 09/01/2016   Procedure: TRANSURETHRAL  RESECTION OF BLADDER TUMOR WITH MITOMYCIN-C;  Surgeon: Hollice Espy, MD;  Location: [facility] ORS;  Service: Urology;  Laterality: N/A;    SOCIAL HISTORY:  Social History   Tobacco Use  . Smoking status: Former Smoker    Packs/day: 2.00    Years: 35.00    Pack years: 70.00    Types: Cigarettes    Quit date: 03/30/2011    Years since quitting: 7.8  . Smokeless tobacco: Never Used  Substance Use Topics  . Alcohol use: No    Alcohol/week: 0.0 standard drinks    FAMILY HISTORY:  Family History  Problem Relation Age of Onset  . Cancer Mother 60       Lung Cancer  . Cancer Father 59       Lung Ca  . Diabetes Son   . Breast cancer Maternal Grandmother   . Kidney cancer Neg Hx   . Bladder Cancer Neg Hx   . Prostate cancer Neg Hx     DRUG ALLERGIES: No Known Allergies  REVIEW OF SYSTEMS:   CONSTITUTIONAL: No fever, fatigue or weakness.  EYES: No blurred or double vision.  EARS, NOSE, AND THROAT: No tinnitus or ear pain.  RESPIRATORY: No cough, shortness of breath, wheezing or hemoptysis.  CARDIOVASCULAR: No chest pain, orthopnea, edema.  GASTROINTESTINAL: No nausea, vomiting, diarrhea or abdominal pain.  GENITOURINARY: No dysuria, hematuria.  ENDOCRINE: No polyuria, nocturia,  HEMATOLOGY: No anemia, easy bruising or bleeding SKIN: No rash or lesion. MUSCULOSKELETAL: No joint pain or arthritis.  Left foot with open wound NEUROLOGIC: No tingling, numbness, weakness.  PSYCHIATRY: No anxiety or depression.   MEDICATIONS AT HOME:  Prior to Admission medications   Medication Sig Start Date End Date Taking? Authorizing Provider  acetaminophen (TYLENOL) 500 MG tablet Take 1,000 mg by mouth every 6 (six) hours as needed for mild pain or headache.    Yes [provider]  ALPRAZolam (XANAX) 0.5 MG tablet TAKE 1 TABLET BY MOUTH DAILY AS NEEDED FOR ANXIETY Patient taking differently: Take 0.5 mg by mouth daily as needed for anxiety.  09/22/18  Yes [name], MD   amLODipine (NORVASC) 5 MG tablet TAKE 1 TABLET(5 MG) BY MOUTH DAILY Patient taking differently: Take 5 mg by mouth daily.  08/10/18  Yes [name], MD  amoxicillin-clavulanate (AUGMENTIN) 875-125 MG tablet Take 1 tablet by mouth 2 (two) times a day. 01/17/19 01/24/19 Yes [provider]  aspirin 81 MG chewable tablet Chew 81 mg by mouth at bedtime.    Yes [provider]  atorvastatin (LIPITOR) 20 MG tablet Take 1 tablet (20 mg total) by mouth daily. Patient taking differently: Take 20 mg by mouth at bedtime.  01/11/19  Yes [name], MD  Cholecalciferol (VITAMIN D3) 2000 UNITS TABS Take 2,000 Units by mouth daily after supper.    Yes [provider]  clopidogrel (PLAVIX) 75 MG  tablet TAKE 1 TABLET BY MOUTH EVERY DAY Patient taking differently: Take 75 mg by mouth every evening.  07/06/18  Yes [name], MD  digoxin (LANOXIN) 0.125 MG tablet TAKE 1 TABLET BY MOUTH EVERY DAY Patient taking differently: Take 0.0625 mg by mouth daily.  08/10/18  Yes [name], MD  gabapentin (NEURONTIN) 600 MG tablet TAKE 1 TABLET BY MOUTH THREE TIMES DAILY Patient taking differently: Take 600 mg by mouth 3 (three) times daily.  08/10/18  Yes [name], MD  glucose blood (ACCU-CHEK AVIVA PLUS) test strip Test blood sugar 2-3 times/ day. 08/17/18  Yes [name], MD  Insulin Lispro Prot & Lispro (HUMALOG MIX 75/25 KWIKPEN) (75-25) 100 UNIT/ML Kwikpen INJECT 30 UNITS UNDER THE SKIN EVERY MORNING AND 36 UNITS EVERY EVENING Patient taking differently: Inject 30-36 Units into the skin See admin instructions. Inject 30u under the skin every morning and inject 36u under the skin every evening 12/21/18  Yes [name], MD  Insulin Pen Needle (B-D ULTRAFINE III SHORT PEN) 31G X 8 MM MISC TEST BLOOD SUGAR TWICE DAILY 11/26/18  Yes [name], MD  Lancets Misc. MISC Use to check blood sugars 3 times daily 07/28/17  Yes [name], MD  LEVEMIR FLEXTOUCH 100 UNIT/ML  Pen INJECT 40 UNITS UNDER THE SKIN EVERY DAY AT 10 PM Patient taking differently: Inject 30 Units into the skin at bedtime.  09/13/18  Yes [name], MD  lisinopril (PRINIVIL,ZESTRIL) 5 MG tablet Take 1 tablet (5 mg total) by mouth daily. 03/25/18  Yes [name], MD  neomycin-bacitracin-polymyxin (NEOSPORIN) ointment Apply 1 application topically as needed for wound care.   Yes [provider]      PHYSICAL EXAMINATION:   VITAL SIGNS: Blood pressure (!) 157/78, pulse 93, temperature 98.6 F (37 C), temperature source Oral, height 5\' 7"  (1.702 m), weight 75.8 kg, SpO2 99 %.  GENERAL:  58 y.o.-year-old patient lying in the bed with no acute distress.  EYES: Pupils equal, round, reactive to light and accommodation. No scleral icterus. Extraocular muscles intact.  HEENT: Head atraumatic, normocephalic. Oropharynx and nasopharynx clear.  NECK:  Supple, no jugular venous distention. No thyroid enlargement, no tenderness.  LUNGS: Normal breath sounds bilaterally, no wheezing, rales,rhonchi or crepitation. No use of accessory muscles of respiration.  CARDIOVASCULAR: S1, S2 normal. No murmurs, rubs, or gallops.  ABDOMEN: Soft, nontender, nondistended. Bowel sounds present. No organomegaly or mass.  EXTREMITIES: Left foot dressing in place this dressing was just placed in Dr Olin Pia office we will not undress.  NEUROLOGIC: Cranial nerves II through XII are intact. Muscle strength 5/5 in all extremities. Sensation intact. Gait not checked.  PSYCHIATRIC: The patient is alert and oriented x 3.  SKIN: No obvious rash, lesion, or ulcer.   LABORATORY PANEL:   CBC Recent Labs  Lab 01/18/19 1027  WBC 12.8*  HGB 11.9*  HCT 34.7*  PLT 277  MCV 85.3  MCH 29.2  MCHC 34.3  RDW 12.8  LYMPHSABS 1.6  MONOABS 0.9  EOSABS 0.2  BASOSABS 0.1   ------------------------------------------------------------------------------------------------------------------  Chemistries  Recent Labs   Lab 01/18/19 1027  NA 135  K 4.1  CL 104  CO2 22  GLUCOSE 160*  BUN 33*  CREATININE 1.04*  CALCIUM 8.8*  AST 17  ALT 15  ALKPHOS 88  BILITOT 0.6   ------------------------------------------------------------------------------------------------------------------ estimated creatinine clearance is 61.1 mL/min (A) (by C-G formula based on SCr of 1.04 mg/dL (H)). ------------------------------------------------------------------------------------------------------------------  No results for input(s): TSH, T4TOTAL, T3FREE, THYROIDAB in the last 72 hours.  Invalid input(s): FREET3   Coagulation profile No results for input(s): INR, PROTIME in the last 168 hours. ------------------------------------------------------------------------------------------------------------------- No results for input(s): DDIMER in the last 72 hours. -------------------------------------------------------------------------------------------------------------------  Cardiac Enzymes No results for input(s): CKMB, TROPONINI, MYOGLOBIN in the last 168 hours.  Invalid input(s): CK ------------------------------------------------------------------------------------------------------------------ Invalid input(s): POCBNP  ---------------------------------------------------------------------------------------------------------------  Urinalysis    Component Value Date/Time   COLORURINE YELLOW (A) 08/27/2016 1335   APPEARANCEUR Clear 01/05/2019 1040   LABSPEC 1.008 08/27/2016 1335   LABSPEC 1.010 01/09/2014 1108   PHURINE 5.0 08/27/2016 1335   GLUCOSEU 2+ (A) 01/05/2019 1040   GLUCOSEU Negative 01/09/2014 1108   HGBUR NEGATIVE 08/27/2016 1335   BILIRUBINUR Negative 01/05/2019 1040   BILIRUBINUR Negative 01/09/2014 1108   KETONESUR NEGATIVE 08/27/2016 1335   PROTEINUR Trace (A) 01/05/2019 1040   PROTEINUR NEGATIVE 08/27/2016 1335   UROBILINOGEN 0.2 04/09/2016 1159   NITRITE Negative 01/05/2019 1040    NITRITE NEGATIVE 08/27/2016 1335   LEUKOCYTESUR 1+ (A) 01/05/2019 1040   LEUKOCYTESUR 3+ 01/09/2014 1108     RADIOLOGY: No results found.  EKG: Orders placed or performed during the hospital encounter of 07/12/18  . EKG 12 lead  . EKG 12 lead    IMPRESSION AND PLAN: Patient 58 year old presenting with left foot osteomyelitis  1.  Left foot osteomyelitis will treat with vancomycin and Zosyn Dr. Caryl Comes has been notified of the admission according to patient she has been told that the surgery will likely be tomorrow  2.  Diabetes type 2 we will place on sliding scale insulin continue her home insulin  3.  Essential hypertension continue Norvasc and lisinopril   4.  Hyperlipidemia continue Lipitor  5.  Coronary artery disease continue Plavix and aspirin  6.  Diabetic neuropathy continue Neurontin     All the records are reviewed and case discussed with ED provider. Management plans discussed with the patient, family and they are in agreement.  CODE STATUS: Code Status History    Date Active Date Inactive Code Status Order ID Comments User Context   07/16/2018 0852 07/16/2018 1208 Full Code [id number]  Sharlotte Alamo, [facility] Inpatient   02/02/2018 1004 02/02/2018 1455 Full Code [id number]  Katha Cabal, MD Inpatient   12/10/2016 1646 12/11/2016 1026 Full Code [id number]  Delana Meyer Dolores Lory, MD Inpatient   11/01/2015 1953 11/02/2015 1507 Full Code [id number]  Fritzi Mandes, MD Inpatient   05/01/2015 1245 05/01/2015 2124 Full Code [id number]  Katha Cabal, MD Inpatient   02/20/2015 1236 02/21/2015 0934 Full Code [id number]  [name], Dolores Lory, MD Inpatient   Advance Care Planning Activity       TOTAL TIME TAKING CARE OF THIS PATIENT:55 minutes.    Dustin Flock M.D on 01/18/2019 at 11:36 AM  Between 7am to 6pm - Pager - [phone]  After 6pm go to [email or web address] - password [email or web address]  [employer] Office  [phone]  CC: Primary care physician; [name],  MD

## 2019-01-18 NOTE — Op Note (Signed)
Date of operation: 01/18/2019.  Surgeon: Durward Fortes D.P.M.  Preoperative diagnosis: Osteomyelitis left third metatarsal.  Postoperative diagnosis: Same.  Procedure: I&D with debridement bone left foot.  Anesthesia: LMA with local.  Hemostasis: Pneumatic tourniquet left ankle 250 mmHg.  Estimated blood loss: 5 cc.  Implants: Stimulan rapid cure antibiotic beads impregnated with vancomycin.  Drains: None.  Pathology: Third metatarsal left foot.  Cultures: Bone culture left third metatarsal.  Complications: None apparent.  Operative indications: This is a 58 year old female with a chronic history of diabetes and neuropathy with ulcerations and previous amputations.  Chronic ulceration beneath the left forefoot which recently extended full-thickness down to the bone and developed osteomyelitis.  Decision made for admission to the hospital with debridement of the infected bone.  Operative procedure: Patient was taken to the operating room and placed on the table in the supine position.  Following satisfactory LMA anesthesia the left foot was anesthetized with 10 cc of 0.5% Marcaine plain around the left forefoot.  Pneumatic tourniquet was applied at the level of the left ankle and the foot was prepped and draped in the usual sterile fashion.  The foot was exsanguinated and the tourniquet inflated to 250 mmHg.    Attention was then directed to the dorsal aspect of the left foot where an approximate 4 cm linear incision was made coursing proximal to distal through the previous incision that was made for amputation of the third toe.  The incision was deepened via sharp and blunt dissection down to the level of the third metatarsal which was dissected away from the surrounding normal anatomy.  Distal aspect of the bone was somewhat deteriorated.  Using a sagittal saw the distal aspect of the third metatarsal was transected and removed in toto.  Using a versa jet debrider on a setting of 3 the  plantar ulceration was thoroughly debrided along with the surrounding tissues from the dorsal approach.  The wound was flushed with copious amounts of sterile saline and the plantar ulceration was closed using 3-0 nylon simple interrupted sutures.  Stimulan rapid cure antibiotic beads impregnated with vancomycin placed dorsally into the wound which was then closed dorsally using 3-0 nylon simple interrupted sutures.  Xeroform 4 x 4's and con form applied to the left foot.  Tourniquet was released.  ABD Kerlix and Ace wrap then applied for compression.  Patient tolerated the procedure and anesthesia well and was transported to the PACU with vital signs stable and in good condition.

## 2019-01-18 NOTE — ED Notes (Signed)
Report given to Wright

## 2019-01-19 ENCOUNTER — Other Ambulatory Visit: Payer: Self-pay | Admitting: Internal Medicine

## 2019-01-19 ENCOUNTER — Telehealth: Payer: Self-pay | Admitting: Internal Medicine

## 2019-01-19 ENCOUNTER — Encounter: Payer: Self-pay | Admitting: Podiatry

## 2019-01-19 LAB — BASIC METABOLIC PANEL
Anion gap: 6 (ref 5–15)
BUN: 24 mg/dL — ABNORMAL HIGH (ref 6–20)
CO2: 23 mmol/L (ref 22–32)
Calcium: 8.4 mg/dL — ABNORMAL LOW (ref 8.9–10.3)
Chloride: 109 mmol/L (ref 98–111)
Creatinine, Ser: 0.91 mg/dL (ref 0.44–1.00)
GFR calc Af Amer: >60 mL/min (ref >60)
GFR calc non Af Amer: >60 mL/min (ref >60)
Glucose, Bld: 271 mg/dL — ABNORMAL HIGH (ref 70–99)
Potassium: 4.6 mmol/L (ref 3.5–5.1)
Sodium: 138 mmol/L (ref 135–145)

## 2019-01-19 LAB — CBC
HCT: 37.1 % (ref 36.0–46.0)
Hemoglobin: 12.2 g/dL (ref 12.0–15.0)
MCH: 28.8 pg (ref 26.0–34.0)
MCHC: 32.9 g/dL (ref 30.0–36.0)
MCV: 87.7 fL (ref 80.0–100.0)
Platelets: 289 10*3/uL (ref 150–400)
RBC: 4.23 MIL/uL (ref 3.87–5.11)
RDW: 12.8 % (ref 11.5–15.5)
WBC: 8.9 10*3/uL (ref 4.0–10.5)
nRBC: 0 % (ref 0.0–0.2)

## 2019-01-19 LAB — GLUCOSE, CAPILLARY
Glucose-Capillary: 115 mg/dL — ABNORMAL HIGH (ref 70–99)
Glucose-Capillary: 278 mg/dL — ABNORMAL HIGH (ref 70–99)
Glucose-Capillary: 370 mg/dL — ABNORMAL HIGH (ref 70–99)

## 2019-01-19 MED ORDER — AMOXICILLIN-POT CLAVULANATE 875-125 MG PO TABS: 1.0000 | ORAL_TABLET | Freq: Two times a day (BID) | ORAL | 0 refills | Status: AC

## 2019-01-19 MED ORDER — CLOPIDOGREL BISULFATE 75 MG PO TABS: 75.0000 mg | ORAL_TABLET | Freq: Every day | ORAL | 1 refills | Status: DC

## 2019-01-19 NOTE — Progress Notes (Signed)
[place] at [place] NAME: Laura Reed    MR#:  [id number]  DATE OF BIRTH:  December 31, 1960  SUBJECTIVE:  CHIEF COMPLAINT:   Chief Complaint  Patient presents with  . admission  Feeling better, hungry and would like to eat her breakfast when I saw her REVIEW OF SYSTEMS:  Review of Systems  Constitutional: Negative for diaphoresis, fever, malaise/fatigue and weight loss.  HENT: Negative for ear discharge, ear pain, hearing loss, nosebleeds, sore throat and tinnitus.   Eyes: Negative for blurred vision and pain.  Respiratory: Negative for cough, hemoptysis, shortness of breath and wheezing.   Cardiovascular: Negative for chest pain, palpitations, orthopnea and leg swelling.  Gastrointestinal: Negative for abdominal pain, blood in stool, constipation, diarrhea, heartburn, nausea and vomiting.  Genitourinary: Negative for dysuria, frequency and urgency.  Musculoskeletal: Positive for joint pain. Negative for back pain and myalgias.  Skin: Negative for itching and rash.  Neurological: Negative for dizziness, tingling, tremors, focal weakness, seizures, weakness and headaches.  Psychiatric/Behavioral: Negative for depression. The patient is not nervous/anxious.     DRUG ALLERGIES:  No Known Allergies VITALS:  Blood pressure (!) 153/55, pulse 70, temperature 98.1 F (36.7 C), resp. rate 18, height 5\' 7"  (1.702 m), weight 75.8 kg, SpO2 98 %. PHYSICAL EXAMINATION:  Physical Exam HENT:     Head: Normocephalic and atraumatic.  Eyes:     Conjunctiva/sclera: Conjunctivae normal.     Pupils: Pupils are equal, round, and reactive to light.  Neck:     Musculoskeletal: Normal range of motion and neck supple.     Thyroid: No thyromegaly.     Trachea: No tracheal deviation.  Cardiovascular:     Rate and Rhythm: Normal rate and regular rhythm.     Heart sounds: Normal heart sounds.  Pulmonary:     Effort: Pulmonary effort is normal. No  respiratory distress.     Breath sounds: Normal breath sounds. No wheezing.  Chest:     Chest wall: No tenderness.  Abdominal:     General: Bowel sounds are normal. There is no distension.     Palpations: Abdomen is soft.     Tenderness: There is no abdominal tenderness.  Musculoskeletal: Normal range of motion.  Feet:     Comments: Left foot in dressing Skin:    General: Skin is warm and dry.     Findings: No rash.  Neurological:     Mental Status: She is alert and oriented to person, place, and time.     Cranial Nerves: No cranial nerve deficit.    LABORATORY PANEL:  Female CBC Recent Labs  Lab 01/19/19 0357  WBC 8.9  HGB 12.2  HCT 37.1  PLT 289   ------------------------------------------------------------------------------------------------------------------ Chemistries  Recent Labs  Lab 01/18/19 1027  01/19/19 0357  NA 135  --  138  K 4.1  --  4.6  CL 104  --  109  CO2 22  --  23  GLUCOSE 160*  --  271*  BUN 33*  --  24*  CREATININE 1.04*   < > 0.91  CALCIUM 8.8*  --  8.4*  AST 17  --   --   ALT 15  --   --   ALKPHOS 88  --   --   BILITOT 0.6  --   --    < > = values in this interval not displayed.   RADIOLOGY:  No results  found. ASSESSMENT AND PLAN:  Patient 58 year old presenting with left foot osteomyelitis  1.  Osteomyelitis left third metatarsal. -Status post I&D with debridement bone left foot on 01/18/2019. -Continue vancomycin and Zosyn for now -Consider switching to oral Cipro/Augmentin at discharge tomorrow.  She will need home health for dressing changes at discharge  2.  Diabetes type 2 - sliding scale insulin continue her home insulin  3.  Essential hypertension continue Norvasc and lisinopril  4.  Hyperlipidemia continue Lipitor  5.  Coronary artery disease continue Plavix and aspirin  6.  Diabetic neuropathy continue Neurontin     All the records are reviewed and case discussed with Care Management/Social  Worker. Management plans discussed with the patient, podiatry, nursing and they are in agreement.  CODE STATUS: Full Code  TOTAL TIME TAKING CARE OF THIS PATIENT: 35 minutes.   More than 50% of the time was spent in counseling/coordination of care: YES  POSSIBLE D/C IN 1 DAYS, DEPENDING ON CLINICAL CONDITION.   Max Sane M.D on 01/19/2019 at 11:36 AM  Between 7am to 6pm - Pager - [phone]  After 6pm go to [email or web address] - Proofreader  [facility]  Office  [phone]  CC: Primary care physician; [name], MD  Note: This dictation was prepared with Dragon dictation along with smaller phrase technology. Any transcriptional errors that result from this process are unintentional.

## 2019-01-19 NOTE — Discharge Instructions (Signed)
Osteomyelitis, Adult  Bone infections (osteomyelitis) occur when bacteria or other germs get inside a bone. This can happen if you have an infection in another part of your body that spreads through your blood. Germs from your skin or from outside of your body can also cause this type of infection if you have a wound or a broken bone (fracture) that breaks the skin. Bone infections need to be treated quickly to prevent bone damage and to prevent the infection from spreading to other areas of your body. What are the causes? Most bone infections are caused by bacteria. They can also be caused by other germs, such as viruses and funguses. What increases the risk? You are more likely to develop this condition if you:  Recently had surgery, especially bone or joint surgery.  Have a long-term (chronic) disease, such as: ? Diabetes. ? HIV (human immunodeficiency virus). ? Rheumatoid arthritis. ? Sickle cell anemia. ? Kidney disease that requires dialysis.  Are aged 60 years or older.  Have a condition or take medicines that block or weaken your body's defense system (immune system).  Have a condition that reduces your blood flow.  Have an artificial joint.  Have had a joint or bone repaired with plates or screws (surgical hardware).  Use IV drugs.  Have a central line for IV access.  Have had trauma, such as stepping on a nail or a broken bone that came through the skin. What are the signs or symptoms? Symptoms vary depending on the type and location of your infection. Common symptoms of bone infections include:  Fever and chills.  Skin redness and warmth.  Swelling.  Pain and stiffness.  Drainage of fluid or pus near the infection. How is this diagnosed? This condition may be diagnosed based on:  Your symptoms and medical history.  A physical exam.  Tests, such as: ? A sample of tissue, fluid, or blood taken to be examined under a microscope. ? Pus or discharge swabbed  from a wound for testing to identify germs and to determine what type of medicine will kill them (culture and sensitivity). ? Blood tests.  Imaging studies. These may include: ? X-rays. ? MRI. ? CT scan. ? Bone scan. ? Ultrasound. How is this treated? Treatment for this condition depends on the cause and type of infection. Antibiotic medicines are usually the first treatment for a bone infection. This may be done in a hospital at first. You may have to continue IV antibiotics at home or take antibiotics by mouth for several weeks after that. Other treatments may include surgery to remove:  Dead or dying tissue from a bone.  An infected artificial joint.  Infected plates or screws that were used to repair a broken bone. Follow these instructions at home: Medicines   Take over-the-counter and prescription medicines only as told by your health care provider.  Take your antibiotic medicine as told by your health care provider. Do not stop taking the antibiotic even if you start to feel better.  Follow instructions from your health care provider about how to take IV antibiotics at home. You may need to have a nurse come to your home to give you the IV antibiotics. General instructions   Ask your health care provider if you have any restrictions on your activities.  If directed, put ice on the affected area: ? Put ice in a plastic bag. ? Place a towel between your skin and the bag. ? Leave the ice on for 20   minutes, 2-3 times a day.  Wash your hands often with soap and water. If soap and water are not available, use hand sanitizer.  Do not use any products that contain nicotine or tobacco, such as cigarettes and e-cigarettes. These can delay bone healing. If you need help quitting, ask your health care provider.  Keep all follow-up visits as told by your health care provider. This is important. Contact a health care provider if:  You develop a fever or chills.  You have  redness, warmth, pain, or swelling that returns after treatment. Get help right away if:  You have rapid breathing or you have trouble breathing.  You have chest pain.  You cannot drink fluids or make urine.  The affected area swells, changes color, or turns blue.  You have numbness or severe pain in the affected area. Summary  Bone infections (osteomyelitis) occur when bacteria or other germs get inside a bone.  You may be more likely to get this type of infection if you have a condition, such as diabetes, that lowers your ability to fight infection or increases your chances of getting an infection.  Most bone infections are caused by bacteria. They can also be caused by other germs, such as viruses and funguses.  Treatment for this condition usually starts with taking antibiotics. Further treatment depends on the cause and type of infection. This information is not intended to replace advice given to you by your health care provider. Make sure you discuss any questions you have with your health care provider. Document Released: 06/23/2005 Document Revised: 07/09/2017 Document Reviewed: 07/02/2017 Elsevier Patient Education  2020 [employer]vier Inc[employer]  

## 2019-01-19 NOTE — Progress Notes (Signed)
1 Day Post-Op   Subjective/Chief Complaint: Patient seen.  Complains of only some mild pain.  States she is only had to take 1 pain pill.   Objective: Vital signs in last 24 hours: Temp:  [96.6 F (35.9 C)-98.1 F (36.7 C)] 98.1 F (36.7 C) (07/15 0743) Pulse Rate:  [65-89] 70 (07/15 0743) Resp:  [11-20] 18 (07/15 0743) BP: (115-153)/(46-78) 153/55 (07/15 0743) SpO2:  [93 %-100 %] 98 % (07/15 0743)    Intake/Output from previous day: 07/14 0701 - 07/15 0700 In: 1320 [P.O.:720; I.V.:500; IV Piggyback:100] Out: 1350 [Urine:1350] Intake/Output this shift: Total I/O In: 910 [P.O.:360; IV Piggyback:550] Out: -   The bandage is dry and intact.  Upon removal there is some strikethrough bleeding on the plantar aspect where the ulceration was present.  Dorsal incision is well coapted.  Significant reduction in the erythema and edema.  It was noted that even prior to surgery there was decreased erythema and she had been on Augmentin.  Lab Results:  Recent Labs    01/18/19 1955 01/19/19 0357  WBC 11.4* 8.9  HGB 12.4 12.2  HCT 36.8 37.1  PLT 271 289   BMET Recent Labs    01/18/19 1027 01/18/19 1955 01/19/19 0357  NA 135  --  138  K 4.1  --  4.6  CL 104  --  109  CO2 22  --  23  GLUCOSE 160*  --  271*  BUN 33*  --  24*  CREATININE 1.04* 0.94 0.91  CALCIUM 8.8*  --  8.4*   PT/INR No results for input(s): LABPROT, INR in the last 72 hours. ABG No results for input(s): PHART, HCO3 in the last 72 hours.  Invalid input(s): PCO2, PO2  Studies/Results: No results found.  Anti-infectives: Anti-infectives (From admission, onward)   Start     Dose/Rate Route Frequency Ordered Stop   01/19/19 0600  vancomycin (VANCOCIN) 1,500 mg in sodium chloride 0.9 % 500 mL IVPB     1,500 mg 250 mL/hr over 120 Minutes Intravenous Every 24 hours 01/18/19 1351     01/18/19 2000  piperacillin-tazobactam (ZOSYN) IVPB 3.375 g     3.375 g 12.5 mL/hr over 240 Minutes Intravenous Every 8  hours 01/18/19 1351     01/18/19 1230  vancomycin (VANCOCIN) 500 mg in sodium chloride 0.9 % 100 mL IVPB     500 mg 100 mL/hr over 60 Minutes Intravenous  Once 01/18/19 1218 01/18/19 1643   01/18/19 1045  vancomycin (VANCOCIN) IVPB 1000 mg/200 mL premix     1,000 mg 200 mL/hr over 60 Minutes Intravenous  Once 01/18/19 1042 01/18/19 1542   01/18/19 1045  piperacillin-tazobactam (ZOSYN) IVPB 3.375 g     3.375 g 100 mL/hr over 30 Minutes Intravenous  Once 01/18/19 1042 01/18/19 1224      Assessment/Plan: s/p Procedure(s): IRRIGATION AND DEBRIDEMENT FOOT (Left) Assessment: Stable status post debridement left foot.   Plan: Betadine and a sterile bandage applied to the left foot.  Discussed with the patient that I think she should be stable for discharge on oral antibiotics which we may have to tailor pending any culture results.  I would recommend home health care for a dressing change in a couple of days with a dry bulky dressing over the plantar ulceration.  And then I will see the patient back on Monday.  I think the patient should be stable for discharge later on today or if not then tomorrow.  LOS: 1 day  Durward Fortes 01/19/2019

## 2019-01-19 NOTE — Telephone Encounter (Signed)
Refill sent.

## 2019-01-19 NOTE — Progress Notes (Signed)
Patient ready for discharge from unit to home. Family to provide transportation. All discharge information reviewed with patient. Medication and follow up appts reviewed with emphasis placed on maintaining follow up appts and medication compliance.  All personal belongings with patient. Dressing in tact to left foot.  Patient to be followed by home health for wound care, dressing changes.  Patient demonstrates no sign or symptoms of distress .

## 2019-01-19 NOTE — Anesthesia Postprocedure Evaluation (Signed)
Anesthesia Post Note  Patient: LEEAN AMEZCUA  Procedure(s) Performed: IRRIGATION AND DEBRIDEMENT FOOT (Left Foot)  Patient location during evaluation: PACU Anesthesia Type: General Level of consciousness: awake and alert Pain management: pain level controlled Vital Signs Assessment: post-procedure vital signs reviewed and stable Respiratory status: spontaneous breathing and respiratory function stable Cardiovascular status: stable Anesthetic complications: no     Last Vitals:  Vitals:   01/18/19 2207 01/18/19 2311  BP: (!) 137/55 (!) 137/55  Pulse: 68 68  Resp: 19 19  Temp: 36.4 C (!) 36.4 C  SpO2: 97% 97%    Last Pain:  Vitals:   01/18/19 2311  TempSrc: Oral  PainSc:                  KEPHART,WILLIAM K

## 2019-01-19 NOTE — Telephone Encounter (Signed)
HFU/ Pt is being discharged from [facility] to home/ Pt has been scheduled on 01/26/2019 @ 11am.

## 2019-01-19 NOTE — TOC Transition Note (Signed)
Transition of Care [facility]) - CM/SW Discharge Note   Patient Details  Name: Laura Reed MRN: [id number] Date of Birth: Sep 05, 1960  Transition of Care [facility]) CM/SW Contact:  Lynnley Doddridge, Lenice Llamas Phone Number: [phone]  01/19/2019, 3:17 PM   Clinical Narrative: Per MD patient needs home health RN and aide. Clinical Education officer, museum (CSW) met with patient at bedside to discuss D/C plan. Patient was alert and oriented X4 and was walking in the room independently. CSW introduced self and explained role of CSW department. Per patient she lives with her adult son in [place] and her sister Dulce Sellar lives close by. Per patient her sister Ivin Booty will do the dressing changes on her foot. Patient does not have preference of home health. Per [facility] representative they can accept patient and can start services Sunday 7/19. MD and patient aware of above. Per [place] they will bring supplies out to patient for dressing changes however they recommend the hospital send patient home with 1 week of supplies so [facility] can get approval from insurance. Per bedside RN she will send patient home with 1 week of supplies for dressings changes. Patient reported that she does not need a walker and has no other needs or concerns. Please reconsult if future social work needs arise. CSW signing off.      Final next level of care: [place] Barriers to Discharge: Barriers Resolved   Patient Goals and CMS Choice Patient states their goals for this hospitalization and ongoing recovery are:: To heal after surgery.   Choice offered to / list presented to : Patient  Discharge Placement                       Discharge Plan and Services                          HH Arranged: RN, Nurse's Aide [facility] Agency: [facility] Date [facility] Agency Contacted: 01/19/19   Representative spoke with at [place]: [place] ([place]) Interventions     Readmission Risk Interventions No flowsheet data found.

## 2019-01-19 NOTE — Telephone Encounter (Signed)
Medication Refill - Medication: clopidogrel (PLAVIX) 75 MG tablet   Has the patient contacted their pharmacy? Yes.  Pt states the pharmacy has faxed over request 3 times. Pt states she is out of medication. Please advise.  (Agent: If no, request that the patient contact the pharmacy for the refill.) (Agent: If yes, when and what did the pharmacy advise?)  Preferred Pharmacy (with phone number or street name):  [id number] [address], [place]  [place] [address]  Phone: [phone] Fax: [phone]  Not a 24 hour pharmacy; exact hours not known.     Agent: Please be advised that RX refills may take up to 3 business days. We ask that you follow-up with your pharmacy.

## 2019-01-20 LAB — SURGICAL PATHOLOGY

## 2019-01-20 NOTE — Telephone Encounter (Signed)
Transition Care Management Follow-up Telephone Call  How have you been since you were released from the hospital? Patient says she is doing great with her foot home health will be out on Sunday to advise patient and her sister on dressing changes.   Do you understand why you were in the hospital? yes   Do you understand the discharge instrcutions? yes  Items Reviewed:  Medications reviewed: yes  Allergies reviewed: yes  Dietary changes reviewed: yes  Referrals reviewed: yes   Functional Questionnaire:   Activities of Daily Living (ADLs):   She states they are independent in the following: ambulation, bathing and hygiene, feeding, continence, grooming, toileting and dressing States they require assistance with the following: Home health out on Sunday for dressing change.   Any transportation issues/concerns?: no   Any patient concerns? no   Confirmed importance and date/time of follow-up visits scheduled: yes   Confirmed with patient if condition begins to worsen call PCP or go to the ER.  Patient was given the Call-a-Nurse line [phone]: yes

## 2019-01-21 NOTE — Discharge Summary (Signed)
Laura Reed at Laura Reed NAME: Laura Reed    MR#:  [id number]  DATE OF BIRTH:  1960/12/21  DATE OF ADMISSION:  01/18/2019   ADMITTING PHYSICIAN: Dustin Flock, MD  DATE OF DISCHARGE: 01/19/2019  3:29 PM  PRIMARY CARE PHYSICIAN: [name], MD   ADMISSION DIAGNOSIS:  Other acute osteomyelitis of left foot ([place]) [M86.172] DISCHARGE DIAGNOSIS:  Active Problems:   Foot osteomyelitis, left ([place])  SECONDARY DIAGNOSIS:   Past Medical History:  Diagnosis Date  . Anxiety   . Bladder cancer ([place])   . CHF (congestive heart failure) ([place])   . Coronary artery disease   . Diabetes mellitus   . Heart murmur   . Hemorrhoid   . Hypertension   . Neuropathy   . PVD (peripheral vascular disease) ([place])   . Thyroid nodule    right  . Urothelial carcinoma of kidney ([place]) 10/31/2014   INVASIVE UROTHELIAL CARCINOMA, LOW GRADE. T1, Nx.   HOSPITAL COURSE:   Patient 58 year old presenting with left foot osteomyelitis  1.Osteomyelitis left third metatarsal. -Status post I&D with debridement bone left foot on 01/18/2019. -Treated with vancomycin and Zosyn while in the Hospital - Augmentin at discharge.    2.Diabetes type 2 - sliding scale insulin while in the Hospital  3.Essential hypertension continue Norvasc and lisinopril  4.Hyperlipidemia continue Lipitor  5.Coronary artery disease continue Plavix and aspirin  6.Diabetic neuropathy continue Neurontin  DISCHARGE CONDITIONS:  stable CONSULTS OBTAINED:   DRUG ALLERGIES:  No Known Allergies DISCHARGE MEDICATIONS:   Allergies as of 01/19/2019   No Known Allergies     Medication List    TAKE these medications   acetaminophen 500 MG tablet Commonly known as: TYLENOL Take 1,000 mg by mouth every 6 (six) hours as needed for mild pain or headache.   ALPRAZolam 0.5 MG tablet Commonly known as: XANAX TAKE 1 TABLET BY MOUTH DAILY AS NEEDED FOR ANXIETY What  changed:   reasons to take this  additional instructions   amLODipine 5 MG tablet Commonly known as: NORVASC TAKE 1 TABLET(5 MG) BY MOUTH DAILY What changed: See the new instructions.   amoxicillin-clavulanate 875-125 MG tablet Commonly known as: Augmentin Take 1 tablet by mouth every 12 (twelve) hours for 7 days. What changed: when to take this   aspirin 81 MG chewable tablet Chew 81 mg by mouth at bedtime.   atorvastatin 20 MG tablet Commonly known as: LIPITOR Take 1 tablet (20 mg total) by mouth daily. What changed: when to take this   digoxin 0.125 MG tablet Commonly known as: LANOXIN TAKE 1 TABLET BY MOUTH EVERY DAY What changed: how much to take   gabapentin 600 MG tablet Commonly known as: NEURONTIN TAKE 1 TABLET BY MOUTH THREE TIMES DAILY   glucose blood test strip Commonly known as: Accu-Chek Aviva Plus Test blood sugar 2-3 times/ day.   Insulin Lispro Prot & Lispro (75-25) 100 UNIT/ML Kwikpen Commonly known as: HumaLOG Mix 75/25 KwikPen INJECT 30 UNITS UNDER THE SKIN EVERY MORNING AND 36 UNITS EVERY EVENING What changed:   how much to take  how to take this  when to take this  additional instructions   Insulin Pen Needle 31G X 8 MM Misc Commonly known as: B-D ULTRAFINE III SHORT PEN TEST BLOOD SUGAR TWICE DAILY   Lancets Misc. Misc Use to check blood sugars 3 times daily   Levemir FlexTouch 100 UNIT/ML Pen Generic drug: Insulin Detemir INJECT 40 UNITS UNDER THE  SKIN EVERY DAY AT 10 PM What changed: See the new instructions.   lisinopril 5 MG tablet Commonly known as: ZESTRIL Take 1 tablet (5 mg total) by mouth daily.   neomycin-bacitracin-polymyxin ointment Commonly known as: NEOSPORIN Apply 1 application topically as needed for wound care.   Vitamin D3 50 MCG (2000 UT) Tabs Take 2,000 Units by mouth daily after supper.        DISCHARGE INSTRUCTIONS:  dry bulky dressing over the plantar ulceration DIET:  Cardiac diet DISCHARGE  CONDITION:  Stable ACTIVITY:  Activity as tolerated OXYGEN:  Home Oxygen: No.  Oxygen Delivery: room air DISCHARGE LOCATION:  home   If you experience worsening of your admission symptoms, develop shortness of breath, life threatening emergency, suicidal or homicidal thoughts you must seek medical attention immediately by calling 911 or calling your MD immediately  if symptoms less severe.  You Must read complete instructions/literature along with all the possible adverse reactions/side effects for all the Medicines you take and that have been prescribed to you. Take any new Medicines after you have completely understood and accpet all the possible adverse reactions/side effects.   Please note  You were cared for by a hospitalist during your hospital stay. If you have any questions about your discharge medications or the care you received while you were in the hospital after you are discharged, you can call the unit and asked to speak with the hospitalist on call if the hospitalist that took care of you is not available. Once you are discharged, your primary care physician will handle any further medical issues. Please note that NO REFILLS for any discharge medications will be authorized once you are discharged, as it is imperative that you return to your primary care physician (or establish a relationship with a primary care physician if you do not have one) for your aftercare needs so that they can reassess your need for medications and monitor your lab values.    On the day of Discharge:  VITAL SIGNS:  Blood pressure (!) 153/55, pulse 70, temperature 98.1 F (36.7 C), resp. rate 18, height 5\' 7"  (1.702 m), weight 75.8 kg, SpO2 98 %. PHYSICAL EXAMINATION:  GENERAL:  58 y.o.-year-old patient lying in the bed with no acute distress.  EYES: Pupils equal, round, reactive to light and accommodation. No scleral icterus. Extraocular muscles intact.  HEENT: Head atraumatic, normocephalic.  Oropharynx and nasopharynx clear.  NECK:  Supple, no jugular venous distention. No thyroid enlargement, no tenderness.  LUNGS: Normal breath sounds bilaterally, no wheezing, rales,rhonchi or crepitation. No use of accessory muscles of respiration.  CARDIOVASCULAR: S1, S2 normal. No murmurs, rubs, or gallops.  ABDOMEN: Soft, non-tender, non-distended. Bowel sounds present. No organomegaly or mass.  EXTREMITIES: No pedal edema, cyanosis, or clubbing.  NEUROLOGIC: Cranial nerves II through XII are intact. Muscle strength 5/5 in all extremities. Sensation intact. Gait not checked.  PSYCHIATRIC: The patient is alert and oriented x 3.  SKIN: No obvious rash, lesion, or ulcer.  DATA REVIEW:   CBC Recent Labs  Lab 01/19/19 0357  WBC 8.9  HGB 12.2  HCT 37.1  PLT 289    Chemistries  Recent Labs  Lab 01/18/19 1027  01/19/19 0357  NA 135  --  138  K 4.1  --  4.6  CL 104  --  109  CO2 22  --  23  GLUCOSE 160*  --  271*  BUN 33*  --  24*  CREATININE 1.04*   < > 0.91  CALCIUM 8.8*  --  8.4*  AST 17  --   --   ALT 15  --   --   ALKPHOS 88  --   --   BILITOT 0.6  --   --    < > = values in this interval not displayed.     Follow-up Information    Sharlotte Alamo, DPM. Go on 01/24/2019.   Specialty: Podiatry Why: at 9:30 am Contact information: [place] [address] [phone]        [name], MD. Schedule an appointment as soon as possible for a visit on 01/26/2019.   Specialty: Internal Medicine Why: Office will call a couple of days Before Appt as Reminder:  Virtual Appt @ 11:00 am Contact information: [address] [place] [address] [phone]           Management plans discussed with the patient, family and they are in agreement.  CODE STATUS: Prior   TOTAL TIME TAKING CARE OF THIS PATIENT: 35 minutes.    Max Sane M.D on 01/21/2019 at 4:30 PM  Between 7am to 6pm - Pager - [phone]  After 6pm go to  [email or web address] - Proofreader  [facility]  Office  [phone]  CC: Primary care physician; [name], MD   Note: This dictation was prepared with Dragon dictation along with smaller phrase technology. Any transcriptional errors that result from this process are unintentional.

## 2019-01-23 LAB — AEROBIC/ANAEROBIC CULTURE W GRAM STAIN (SURGICAL/DEEP WOUND): Culture: NO GROWTH

## 2019-01-24 DIAGNOSIS — M86172 Other acute osteomyelitis, left ankle and foot: Secondary | ICD-10-CM | POA: Diagnosis not present

## 2019-01-25 DIAGNOSIS — I5032 Chronic diastolic (congestive) heart failure: Secondary | ICD-10-CM | POA: Diagnosis not present

## 2019-01-25 DIAGNOSIS — Z87891 Personal history of nicotine dependence: Secondary | ICD-10-CM | POA: Diagnosis not present

## 2019-01-25 DIAGNOSIS — E1151 Type 2 diabetes mellitus with diabetic peripheral angiopathy without gangrene: Secondary | ICD-10-CM | POA: Diagnosis not present

## 2019-01-25 DIAGNOSIS — Z85528 Personal history of other malignant neoplasm of kidney: Secondary | ICD-10-CM | POA: Diagnosis not present

## 2019-01-25 DIAGNOSIS — Z7902 Long term (current) use of antithrombotics/antiplatelets: Secondary | ICD-10-CM | POA: Diagnosis not present

## 2019-01-25 DIAGNOSIS — Z7982 Long term (current) use of aspirin: Secondary | ICD-10-CM | POA: Diagnosis not present

## 2019-01-25 DIAGNOSIS — I110 Hypertensive heart disease with heart failure: Secondary | ICD-10-CM | POA: Diagnosis not present

## 2019-01-25 DIAGNOSIS — Z89421 Acquired absence of other right toe(s): Secondary | ICD-10-CM | POA: Diagnosis not present

## 2019-01-25 DIAGNOSIS — I2510 Atherosclerotic heart disease of native coronary artery without angina pectoris: Secondary | ICD-10-CM | POA: Diagnosis not present

## 2019-01-25 DIAGNOSIS — M869 Osteomyelitis, unspecified: Secondary | ICD-10-CM | POA: Diagnosis not present

## 2019-01-25 DIAGNOSIS — E1142 Type 2 diabetes mellitus with diabetic polyneuropathy: Secondary | ICD-10-CM | POA: Diagnosis not present

## 2019-01-25 DIAGNOSIS — Z792 Long term (current) use of antibiotics: Secondary | ICD-10-CM | POA: Diagnosis not present

## 2019-01-25 DIAGNOSIS — Z89422 Acquired absence of other left toe(s): Secondary | ICD-10-CM | POA: Diagnosis not present

## 2019-01-25 DIAGNOSIS — E785 Hyperlipidemia, unspecified: Secondary | ICD-10-CM | POA: Diagnosis not present

## 2019-01-25 DIAGNOSIS — G4700 Insomnia, unspecified: Secondary | ICD-10-CM | POA: Diagnosis not present

## 2019-01-25 DIAGNOSIS — Z794 Long term (current) use of insulin: Secondary | ICD-10-CM | POA: Diagnosis not present

## 2019-01-25 DIAGNOSIS — E11621 Type 2 diabetes mellitus with foot ulcer: Secondary | ICD-10-CM | POA: Diagnosis not present

## 2019-01-25 DIAGNOSIS — L97521 Non-pressure chronic ulcer of other part of left foot limited to breakdown of skin: Secondary | ICD-10-CM | POA: Diagnosis not present

## 2019-01-25 DIAGNOSIS — E1169 Type 2 diabetes mellitus with other specified complication: Secondary | ICD-10-CM | POA: Diagnosis not present

## 2019-01-26 ENCOUNTER — Encounter: Payer: Self-pay | Admitting: Internal Medicine

## 2019-01-26 ENCOUNTER — Ambulatory Visit (INDEPENDENT_AMBULATORY_CARE_PROVIDER_SITE_OTHER): Payer: Medicare Other | Admitting: Internal Medicine

## 2019-01-26 ENCOUNTER — Other Ambulatory Visit: Payer: Self-pay

## 2019-01-26 DIAGNOSIS — I10 Essential (primary) hypertension: Secondary | ICD-10-CM

## 2019-01-26 DIAGNOSIS — E1142 Type 2 diabetes mellitus with diabetic polyneuropathy: Secondary | ICD-10-CM

## 2019-01-26 DIAGNOSIS — Z09 Encounter for follow-up examination after completed treatment for conditions other than malignant neoplasm: Secondary | ICD-10-CM

## 2019-01-26 MED ORDER — LOSARTAN POTASSIUM 50 MG PO TABS: 50.0000 mg | ORAL_TABLET | Freq: Every day | ORAL | 3 refills | Status: DC

## 2019-01-26 NOTE — Patient Instructions (Addendum)
Continue amlodipine at night  Stop the lisinopril  start losartan 50 mg at night    Goal blood pressure  is 120-130/70-80  After one week     Check your blood sugar around 3:30 am to see if you are dropping very low     Daily use of Probiotics for  At least 3 weeks during antibiotic use is advised to reduce risk of C dificile colitis.

## 2019-01-26 NOTE — Progress Notes (Signed)
Virtual Visit converted to telephone  Note  This visit type was conducted due to national recommendations for restrictions regarding the COVID-19 pandemic (e.g. social distancing).  This format is felt to be most appropriate for this patient at this time.  All issues noted in this document were discussed and addressed.  No physical exam was performed (except for noted visual exam findings with Video Visits).   I attempted to connect with@ on 01/26/19 at 11:00 AM EDT by a video enabled telemedicine application . Interactive audio and video telecommunications were attempted between this provider and patient, however failed, due to patient having technical difficulties   We continued and completed visit with audio only.   ..tor telephone and verified that I am speaking with the correct person using two identifiers. Location patient: home Location provider: work or home office Persons participating in the virtual visit: patient, provider  I discussed the limitations, risks, security and privacy concerns of performing an evaluation and management service by telephone and the availability of in person appointments. I also discussed with the patient that there may be a patient responsible charge related to this service. The patient expressed understanding and agreed to proceed.  Reason for visit: hospital follow up  HPI:   Admitted to [facility] July 14   For debridement  Of  Infected foot ulcer resulting in osteomyelitis of the left foot 3rd metatarsal .  She was treated with vancomycin and Zosyn and discharged on July 1 with Augmentin .  She denies pain because she has a profound neuropathy of lower extremities , but has noticed elevated blood pressure readings.    3 month follow up on diabetes.  Patient has no complaints today.  Patient is following a low glycemic index diet and taking all prescribed medications regularly without side effects.  Fasting sugars have been elevated to 160 often and post  prandials have been under 160 except on rare occasions. Patient is not exercising or intentionally trying to lose weight .  Patient has had an eye exam in the last 12 months and checks feet regularly for signs of infection.  Patient does not walk barefoot outside, . Patient is up to date on all recommended vaccinations Insulin regimen  Uses 70/30 and levemir ,  takes 30 in the am 36 in the afternoon and 30 levemir    Lab Results  Component Value Date   HGBA1C 7.4 (H) 12/03/2018      ROS: See pertinent positives and negatives per HPI.  Past Medical History:  Diagnosis Date  . Anxiety   . Bladder cancer ([place])   . CHF (congestive heart failure) ([place])   . Coronary artery disease   . Diabetes mellitus   . Heart murmur   . Hemorrhoid   . Hypertension   . Neuropathy   . PVD (peripheral vascular disease) ([place])   . Thyroid nodule    right  . Urothelial carcinoma of kidney ([place]) 10/31/2014   INVASIVE UROTHELIAL CARCINOMA, LOW GRADE. T1, Nx.    Past Surgical History:  Procedure Laterality Date  . AMPUTATION TOE     right (4th and 5th); left (great toe, 3rd)  . AMPUTATION TOE Right 07/16/2018   Procedure: AMPUTATION TOE/MPJ right 2nd;  Surgeon: Sharlotte Alamo, DPM;  Location: [facility];  Service: Podiatry;  Laterality: Right;  . ARTERIAL BYPASS SURGRY  2009, 2013 x 2   right leg , done in [place]  . CARDIAC CATHETERIZATION    . CAROTID ENDARTERECTOMY Right 01/2014   Dr  [name]  . CATARACT EXTRACTION W/PHACO Right 12/14/2014   Procedure: CATARACT EXTRACTION PHACO AND INTRAOCULAR LENS PLACEMENT (IOC);  Surgeon: Lyla Glassing, MD;  Location: [facility];  Service: Ophthalmology;  Laterality: Right;  [place]   00:38.6              AP        7.1                   CDE  2.76  . CESAREAN SECTION    . CHOLECYSTECTOMY  03-03-12   Porcelain gallbladder, gallstones,  [place]  . COLONOSCOPY W/ BIOPSIES  04/28/2012   Hyperplastic rectal polyps.  . CORONARY ARTERY BYPASS GRAFT  2009   3 vessel  . CYSTOSCOPY  W/ RETROGRADES Right 09/01/2016   Procedure: CYSTOSCOPY WITH RETROGRADE PYELOGRAM;  Surgeon: Hollice Espy, MD;  Location: [facility];  Service: Urology;  Laterality: Right;  . EYE SURGERY    . HERNIA REPAIR  10-31-14   ventral, retro-rectus atrium mesh  . IRRIGATION AND DEBRIDEMENT FOOT Left 01/18/2019   Procedure: IRRIGATION AND DEBRIDEMENT FOOT;  Surgeon: Sharlotte Alamo, DPM;  Location: [facility];  Service: Podiatry;  Laterality: Left;  . LOWER EXTREMITY ANGIOGRAPHY Left 12/10/2016   Procedure: Lower Extremity Angiography;  Surgeon: Katha Cabal, MD;  Location: [place] [facility];  Service: Cardiovascular;  Laterality: Left;  . LOWER EXTREMITY ANGIOGRAPHY Left 02/02/2018   Procedure: LOWER EXTREMITY ANGIOGRAPHY;  Surgeon: Katha Cabal, MD;  Location: [place] [facility];  Service: Cardiovascular;  Laterality: Left;  . LOWER EXTREMITY ANGIOGRAPHY Left 05/05/2018   Procedure: LOWER EXTREMITY ANGIOGRAPHY;  Surgeon: Katha Cabal, MD;  Location: [place] [facility];  Service: Cardiovascular;  Laterality: Left;  . NEPHRECTOMY Left 10-31-14  . PERIPHERAL VASCULAR CATHETERIZATION Left 05/01/2015   Procedure: Lower Extremity Angiography;  Surgeon: Katha Cabal, MD;  Location: [place] [facility];  Service: Cardiovascular;  Laterality: Left;  . PERIPHERAL VASCULAR CATHETERIZATION  05/01/2015   Procedure: Lower Extremity Intervention;  Surgeon: Katha Cabal, MD;  Location: [place] [facility];  Service: Cardiovascular;;  . PERIPHERAL VASCULAR CATHETERIZATION Left 02/20/2015   Procedure: Pelvic Angiography;  Surgeon: Katha Cabal, MD;  Location: [place] [facility];  Service: Cardiovascular;  Laterality: Left;  . TRANSURETHRAL RESECTION OF BLADDER TUMOR WITH MITOMYCIN-C N/A 09/01/2016   Procedure: TRANSURETHRAL RESECTION OF BLADDER TUMOR WITH MITOMYCIN-C;  Surgeon: Hollice Espy, MD;  Location: [facility];  Service: Urology;  Laterality: N/A;    Family History  Problem  Relation Age of Onset  . Cancer Mother 49       Lung Cancer  . Cancer Father 40       Lung Ca  . Diabetes Son   . Breast cancer Maternal Grandmother   . Kidney cancer Neg Hx   . Bladder Cancer Neg Hx   . Prostate cancer Neg Hx     SOCIAL HX:  reports that she quit smoking about 7 years ago. Her smoking use included cigarettes. She has a 70.00 pack-year smoking history. She has never used smokeless tobacco. She reports previous drug use. Drug: Cocaine. She reports that she does not drink alcohol.   Current Outpatient Medications:  .  acetaminophen (TYLENOL) 500 MG tablet, Take 1,000 mg by mouth every 6 (six) hours as needed for mild pain or headache. , Disp: , Rfl:  .  ALPRAZolam (XANAX) 0.5 MG tablet, TAKE 1 TABLET BY MOUTH DAILY AS NEEDED FOR ANXIETY (Patient taking differently: Take 0.5  mg by mouth daily as needed for anxiety. ), Disp: 30 tablet, Rfl: 5 .  amLODipine (NORVASC) 5 MG tablet, TAKE 1 TABLET(5 MG) BY MOUTH DAILY (Patient taking differently: Take 5 mg by mouth daily. ), Disp: 90 tablet, Rfl: 1 .  aspirin 81 MG chewable tablet, Chew 81 mg by mouth at bedtime. , Disp: , Rfl:  .  atorvastatin (LIPITOR) 20 MG tablet, Take 1 tablet (20 mg total) by mouth daily. (Patient taking differently: Take 20 mg by mouth at bedtime. ), Disp: 90 tablet, Rfl: 1 .  Cholecalciferol (VITAMIN D3) 2000 UNITS TABS, Take 2,000 Units by mouth daily after supper. , Disp: , Rfl:  .  clopidogrel (PLAVIX) 75 MG tablet, Take 1 tablet (75 mg total) by mouth daily., Disp: 90 tablet, Rfl: 1 .  digoxin (LANOXIN) 0.125 MG tablet, TAKE 1 TABLET BY MOUTH EVERY DAY (Patient taking differently: Take 0.0625 mg by mouth daily. ), Disp: 90 tablet, Rfl: 1 .  gabapentin (NEURONTIN) 600 MG tablet, TAKE 1 TABLET BY MOUTH THREE TIMES DAILY (Patient taking differently: Take 600 mg by mouth 3 (three) times daily. ), Disp: 270 tablet, Rfl: 1 .  glucose blood (ACCU-CHEK AVIVA PLUS) test strip, Test blood sugar 2-3 times/ day.,  Disp: 300 each, Rfl: 1 .  Insulin Lispro Prot & Lispro (HUMALOG MIX 75/25 KWIKPEN) (75-25) 100 UNIT/ML Kwikpen, INJECT 30 UNITS UNDER THE SKIN EVERY MORNING AND 36 UNITS EVERY EVENING (Patient taking differently: Inject 30-36 Units into the skin See admin instructions. Inject 30u under the skin every morning and inject 36u under the skin every evening), Disp: 30 mL, Rfl: 2 .  Insulin Pen Needle (B-D ULTRAFINE III SHORT PEN) 31G X 8 MM MISC, TEST BLOOD SUGAR TWICE DAILY, Disp: 100 each, Rfl: 2 .  Lancets Misc. MISC, Use to check blood sugars 3 times daily, Disp: 100 each, Rfl: 11 .  LEVEMIR FLEXTOUCH 100 UNIT/ML Pen, INJECT 40 UNITS UNDER THE SKIN EVERY DAY AT 10 PM (Patient taking differently: Inject 30 Units into the skin at bedtime. ), Disp: 15 mL, Rfl: 2 .  losartan (COZAAR) 50 MG tablet, Take 1 tablet (50 mg total) by mouth at bedtime., Disp: 90 tablet, Rfl: 3 .  neomycin-bacitracin-polymyxin (NEOSPORIN) ointment, Apply 1 application topically as needed for wound care., Disp: , Rfl:   EXAM:  VITALS per patient if applicable:  GENERAL: alert, oriented, appears well and in no acute distress  HEENT: atraumatic, conjunttiva clear, no obvious abnormalities on inspection of external nose and ears  NECK: normal movements of the head and neck  LUNGS: on inspection no signs of respiratory distress, breathing rate appears normal, no obvious gross SOB, gasping or wheezing  CV: no obvious cyanosis  MS: moves all visible extremities without noticeable abnormality  PSYCH/NEURO: pleasant and cooperative, no obvious depression or anxiety, speech and thought processing grossly intact  ASSESSMENT AND PLAN:  Diabetic peripheral neuropathy associated with type 2 diabetes mellitus Mild Loss of control due to dietary non adherence noted at last visit. She is currently Using mixed insulin twice daily 30 units in the am,.  36 in the evening, and 40 units of Levemir daily.  Her a1c is not  Due yet  and  preoperative clearance will be given if her a1c is < 8.0  Lab Results  Component Value Date   HGBA1C 7.4 (H) 12/03/2018     Hypertension stopping low dose lisinopril and adding losartan 50 mg daily at bedtime   Hospital discharge follow-up Patient is  stable post discharge and has no new issues or questions about discharge plans at the visit today for hospital follow up. All labs , imaging studies and progress notes from admission were reviewed with patient today      I discussed the assessment and treatment plan with the patient. The patient was provided an opportunity to ask questions and all were answered. The patient agreed with the plan and demonstrated an understanding of the instructions.   The patient was advised to call back or seek an in-person evaluation if the symptoms worsen or if the condition fails to improve as anticipated.  I provided 25 minutes of non-face-to-face time during this encounter.   [name], MD

## 2019-01-27 NOTE — Assessment & Plan Note (Signed)
Patient is stable post discharge and has no new issues or questions about discharge plans at the visit today for hospital follow up. All labs , imaging studies and progress notes from admission were reviewed with patient today   

## 2019-01-27 NOTE — Assessment & Plan Note (Addendum)
stopping low dose lisinopril and adding losartan 50 mg daily at bedtime

## 2019-01-27 NOTE — Assessment & Plan Note (Signed)
Mild Loss of control due to dietary non adherence noted at last visit. She is currently Using mixed insulin twice daily 30 units in the am,.  36 in the evening, and 40 units of Levemir daily.  Her a1c is not  Due yet  and preoperative clearance will be given if her a1c is < 8.0  Lab Results  Component Value Date   HGBA1C 7.4 (H) 12/03/2018

## 2019-01-28 ENCOUNTER — Ambulatory Visit: Payer: Medicare Other

## 2019-02-01 DIAGNOSIS — I2510 Atherosclerotic heart disease of native coronary artery without angina pectoris: Secondary | ICD-10-CM | POA: Diagnosis not present

## 2019-02-01 DIAGNOSIS — Z85528 Personal history of other malignant neoplasm of kidney: Secondary | ICD-10-CM | POA: Diagnosis not present

## 2019-02-01 DIAGNOSIS — Z87891 Personal history of nicotine dependence: Secondary | ICD-10-CM | POA: Diagnosis not present

## 2019-02-01 DIAGNOSIS — I110 Hypertensive heart disease with heart failure: Secondary | ICD-10-CM | POA: Diagnosis not present

## 2019-02-01 DIAGNOSIS — E1151 Type 2 diabetes mellitus with diabetic peripheral angiopathy without gangrene: Secondary | ICD-10-CM | POA: Diagnosis not present

## 2019-02-01 DIAGNOSIS — Z7902 Long term (current) use of antithrombotics/antiplatelets: Secondary | ICD-10-CM | POA: Diagnosis not present

## 2019-02-01 DIAGNOSIS — E1169 Type 2 diabetes mellitus with other specified complication: Secondary | ICD-10-CM | POA: Diagnosis not present

## 2019-02-01 DIAGNOSIS — E1142 Type 2 diabetes mellitus with diabetic polyneuropathy: Secondary | ICD-10-CM | POA: Diagnosis not present

## 2019-02-01 DIAGNOSIS — E785 Hyperlipidemia, unspecified: Secondary | ICD-10-CM | POA: Diagnosis not present

## 2019-02-01 DIAGNOSIS — Z89422 Acquired absence of other left toe(s): Secondary | ICD-10-CM | POA: Diagnosis not present

## 2019-02-01 DIAGNOSIS — M869 Osteomyelitis, unspecified: Secondary | ICD-10-CM | POA: Diagnosis not present

## 2019-02-01 DIAGNOSIS — L97521 Non-pressure chronic ulcer of other part of left foot limited to breakdown of skin: Secondary | ICD-10-CM | POA: Diagnosis not present

## 2019-02-01 DIAGNOSIS — E11621 Type 2 diabetes mellitus with foot ulcer: Secondary | ICD-10-CM | POA: Diagnosis not present

## 2019-02-01 DIAGNOSIS — Z89421 Acquired absence of other right toe(s): Secondary | ICD-10-CM | POA: Diagnosis not present

## 2019-02-01 DIAGNOSIS — I5032 Chronic diastolic (congestive) heart failure: Secondary | ICD-10-CM | POA: Diagnosis not present

## 2019-02-01 DIAGNOSIS — Z792 Long term (current) use of antibiotics: Secondary | ICD-10-CM | POA: Diagnosis not present

## 2019-02-01 DIAGNOSIS — G4700 Insomnia, unspecified: Secondary | ICD-10-CM | POA: Diagnosis not present

## 2019-02-01 DIAGNOSIS — Z794 Long term (current) use of insulin: Secondary | ICD-10-CM | POA: Diagnosis not present

## 2019-02-01 DIAGNOSIS — Z7982 Long term (current) use of aspirin: Secondary | ICD-10-CM | POA: Diagnosis not present

## 2019-02-04 ENCOUNTER — Ambulatory Visit: Payer: Medicare Other

## 2019-02-04 DIAGNOSIS — E1142 Type 2 diabetes mellitus with diabetic polyneuropathy: Secondary | ICD-10-CM | POA: Diagnosis not present

## 2019-02-04 DIAGNOSIS — Z794 Long term (current) use of insulin: Secondary | ICD-10-CM | POA: Diagnosis not present

## 2019-02-04 DIAGNOSIS — Z89422 Acquired absence of other left toe(s): Secondary | ICD-10-CM | POA: Diagnosis not present

## 2019-02-04 DIAGNOSIS — G4700 Insomnia, unspecified: Secondary | ICD-10-CM | POA: Diagnosis not present

## 2019-02-04 DIAGNOSIS — E1169 Type 2 diabetes mellitus with other specified complication: Secondary | ICD-10-CM | POA: Diagnosis not present

## 2019-02-04 DIAGNOSIS — Z7902 Long term (current) use of antithrombotics/antiplatelets: Secondary | ICD-10-CM | POA: Diagnosis not present

## 2019-02-04 DIAGNOSIS — Z7982 Long term (current) use of aspirin: Secondary | ICD-10-CM | POA: Diagnosis not present

## 2019-02-04 DIAGNOSIS — I110 Hypertensive heart disease with heart failure: Secondary | ICD-10-CM | POA: Diagnosis not present

## 2019-02-04 DIAGNOSIS — M869 Osteomyelitis, unspecified: Secondary | ICD-10-CM | POA: Diagnosis not present

## 2019-02-04 DIAGNOSIS — L97521 Non-pressure chronic ulcer of other part of left foot limited to breakdown of skin: Secondary | ICD-10-CM | POA: Diagnosis not present

## 2019-02-04 DIAGNOSIS — I2510 Atherosclerotic heart disease of native coronary artery without angina pectoris: Secondary | ICD-10-CM | POA: Diagnosis not present

## 2019-02-04 DIAGNOSIS — I5032 Chronic diastolic (congestive) heart failure: Secondary | ICD-10-CM | POA: Diagnosis not present

## 2019-02-04 DIAGNOSIS — E785 Hyperlipidemia, unspecified: Secondary | ICD-10-CM | POA: Diagnosis not present

## 2019-02-04 DIAGNOSIS — Z87891 Personal history of nicotine dependence: Secondary | ICD-10-CM | POA: Diagnosis not present

## 2019-02-04 DIAGNOSIS — Z792 Long term (current) use of antibiotics: Secondary | ICD-10-CM | POA: Diagnosis not present

## 2019-02-04 DIAGNOSIS — Z89421 Acquired absence of other right toe(s): Secondary | ICD-10-CM | POA: Diagnosis not present

## 2019-02-04 DIAGNOSIS — Z85528 Personal history of other malignant neoplasm of kidney: Secondary | ICD-10-CM | POA: Diagnosis not present

## 2019-02-04 DIAGNOSIS — E1151 Type 2 diabetes mellitus with diabetic peripheral angiopathy without gangrene: Secondary | ICD-10-CM | POA: Diagnosis not present

## 2019-02-04 DIAGNOSIS — E11621 Type 2 diabetes mellitus with foot ulcer: Secondary | ICD-10-CM | POA: Diagnosis not present

## 2019-02-07 DIAGNOSIS — E11621 Type 2 diabetes mellitus with foot ulcer: Secondary | ICD-10-CM | POA: Diagnosis not present

## 2019-02-07 DIAGNOSIS — E1151 Type 2 diabetes mellitus with diabetic peripheral angiopathy without gangrene: Secondary | ICD-10-CM | POA: Diagnosis not present

## 2019-02-07 DIAGNOSIS — I110 Hypertensive heart disease with heart failure: Secondary | ICD-10-CM | POA: Diagnosis not present

## 2019-02-07 DIAGNOSIS — G4700 Insomnia, unspecified: Secondary | ICD-10-CM | POA: Diagnosis not present

## 2019-02-07 DIAGNOSIS — Z7902 Long term (current) use of antithrombotics/antiplatelets: Secondary | ICD-10-CM | POA: Diagnosis not present

## 2019-02-07 DIAGNOSIS — Z87891 Personal history of nicotine dependence: Secondary | ICD-10-CM | POA: Diagnosis not present

## 2019-02-07 DIAGNOSIS — E1142 Type 2 diabetes mellitus with diabetic polyneuropathy: Secondary | ICD-10-CM | POA: Diagnosis not present

## 2019-02-07 DIAGNOSIS — I5032 Chronic diastolic (congestive) heart failure: Secondary | ICD-10-CM | POA: Diagnosis not present

## 2019-02-07 DIAGNOSIS — Z792 Long term (current) use of antibiotics: Secondary | ICD-10-CM | POA: Diagnosis not present

## 2019-02-07 DIAGNOSIS — E785 Hyperlipidemia, unspecified: Secondary | ICD-10-CM | POA: Diagnosis not present

## 2019-02-07 DIAGNOSIS — L97521 Non-pressure chronic ulcer of other part of left foot limited to breakdown of skin: Secondary | ICD-10-CM | POA: Diagnosis not present

## 2019-02-07 DIAGNOSIS — Z7982 Long term (current) use of aspirin: Secondary | ICD-10-CM | POA: Diagnosis not present

## 2019-02-07 DIAGNOSIS — Z89422 Acquired absence of other left toe(s): Secondary | ICD-10-CM | POA: Diagnosis not present

## 2019-02-07 DIAGNOSIS — Z89421 Acquired absence of other right toe(s): Secondary | ICD-10-CM | POA: Diagnosis not present

## 2019-02-07 DIAGNOSIS — I2510 Atherosclerotic heart disease of native coronary artery without angina pectoris: Secondary | ICD-10-CM | POA: Diagnosis not present

## 2019-02-07 DIAGNOSIS — M869 Osteomyelitis, unspecified: Secondary | ICD-10-CM | POA: Diagnosis not present

## 2019-02-07 DIAGNOSIS — E1169 Type 2 diabetes mellitus with other specified complication: Secondary | ICD-10-CM | POA: Diagnosis not present

## 2019-02-07 DIAGNOSIS — Z794 Long term (current) use of insulin: Secondary | ICD-10-CM | POA: Diagnosis not present

## 2019-02-07 DIAGNOSIS — Z85528 Personal history of other malignant neoplasm of kidney: Secondary | ICD-10-CM | POA: Diagnosis not present

## 2019-02-08 DIAGNOSIS — S91302A Unspecified open wound, left foot, initial encounter: Secondary | ICD-10-CM | POA: Diagnosis not present

## 2019-02-08 DIAGNOSIS — Z794 Long term (current) use of insulin: Secondary | ICD-10-CM | POA: Diagnosis not present

## 2019-02-08 DIAGNOSIS — Z4801 Encounter for change or removal of surgical wound dressing: Secondary | ICD-10-CM | POA: Diagnosis not present

## 2019-02-08 DIAGNOSIS — E108 Type 1 diabetes mellitus with unspecified complications: Secondary | ICD-10-CM | POA: Diagnosis not present

## 2019-02-08 DIAGNOSIS — E109 Type 1 diabetes mellitus without complications: Secondary | ICD-10-CM | POA: Diagnosis not present

## 2019-02-09 ENCOUNTER — Other Ambulatory Visit: Payer: Self-pay

## 2019-02-09 DIAGNOSIS — I2510 Atherosclerotic heart disease of native coronary artery without angina pectoris: Secondary | ICD-10-CM | POA: Diagnosis not present

## 2019-02-09 DIAGNOSIS — E785 Hyperlipidemia, unspecified: Secondary | ICD-10-CM | POA: Diagnosis not present

## 2019-02-09 DIAGNOSIS — Z87891 Personal history of nicotine dependence: Secondary | ICD-10-CM | POA: Diagnosis not present

## 2019-02-09 DIAGNOSIS — I5032 Chronic diastolic (congestive) heart failure: Secondary | ICD-10-CM | POA: Diagnosis not present

## 2019-02-09 DIAGNOSIS — Z792 Long term (current) use of antibiotics: Secondary | ICD-10-CM | POA: Diagnosis not present

## 2019-02-09 DIAGNOSIS — G4700 Insomnia, unspecified: Secondary | ICD-10-CM | POA: Diagnosis not present

## 2019-02-09 DIAGNOSIS — I110 Hypertensive heart disease with heart failure: Secondary | ICD-10-CM | POA: Diagnosis not present

## 2019-02-09 DIAGNOSIS — Z7902 Long term (current) use of antithrombotics/antiplatelets: Secondary | ICD-10-CM | POA: Diagnosis not present

## 2019-02-09 DIAGNOSIS — E1142 Type 2 diabetes mellitus with diabetic polyneuropathy: Secondary | ICD-10-CM | POA: Diagnosis not present

## 2019-02-09 DIAGNOSIS — Z89422 Acquired absence of other left toe(s): Secondary | ICD-10-CM | POA: Diagnosis not present

## 2019-02-09 DIAGNOSIS — Z7982 Long term (current) use of aspirin: Secondary | ICD-10-CM | POA: Diagnosis not present

## 2019-02-09 DIAGNOSIS — L97521 Non-pressure chronic ulcer of other part of left foot limited to breakdown of skin: Secondary | ICD-10-CM | POA: Diagnosis not present

## 2019-02-09 DIAGNOSIS — E1169 Type 2 diabetes mellitus with other specified complication: Secondary | ICD-10-CM | POA: Diagnosis not present

## 2019-02-09 DIAGNOSIS — Z89421 Acquired absence of other right toe(s): Secondary | ICD-10-CM | POA: Diagnosis not present

## 2019-02-09 DIAGNOSIS — M869 Osteomyelitis, unspecified: Secondary | ICD-10-CM | POA: Diagnosis not present

## 2019-02-09 DIAGNOSIS — E1151 Type 2 diabetes mellitus with diabetic peripheral angiopathy without gangrene: Secondary | ICD-10-CM | POA: Diagnosis not present

## 2019-02-09 DIAGNOSIS — Z794 Long term (current) use of insulin: Secondary | ICD-10-CM | POA: Diagnosis not present

## 2019-02-09 DIAGNOSIS — Z85528 Personal history of other malignant neoplasm of kidney: Secondary | ICD-10-CM | POA: Diagnosis not present

## 2019-02-09 DIAGNOSIS — E11621 Type 2 diabetes mellitus with foot ulcer: Secondary | ICD-10-CM | POA: Diagnosis not present

## 2019-02-09 MED ORDER — DIGOXIN 125 MCG PO TABS: 125.0000 ug | ORAL_TABLET | Freq: Every day | ORAL | 1 refills | Status: DC

## 2019-02-09 MED ORDER — AMLODIPINE BESYLATE 5 MG PO TABS: ORAL_TABLET | ORAL | 1 refills | Status: DC

## 2019-02-09 MED ORDER — GABAPENTIN 600 MG PO TABS: 600.0000 mg | ORAL_TABLET | Freq: Three times a day (TID) | ORAL | 1 refills | Status: DC

## 2019-02-10 ENCOUNTER — Telehealth: Payer: Self-pay | Admitting: Urology

## 2019-02-10 NOTE — Telephone Encounter (Signed)
Patient notified that this will not effect the BCG treatment. She states she was put on abx post op from a foot surgery

## 2019-02-10 NOTE — Telephone Encounter (Signed)
Pt. Would like to know if she can take her prescription of Augmentin 875mg  before her BCG appointment on 01/17/19? She does not want it to interfere with the BCG.

## 2019-02-11 ENCOUNTER — Other Ambulatory Visit: Payer: Self-pay

## 2019-02-11 ENCOUNTER — Ambulatory Visit (INDEPENDENT_AMBULATORY_CARE_PROVIDER_SITE_OTHER): Payer: Medicare Other

## 2019-02-11 DIAGNOSIS — G4700 Insomnia, unspecified: Secondary | ICD-10-CM | POA: Diagnosis not present

## 2019-02-11 DIAGNOSIS — Z89421 Acquired absence of other right toe(s): Secondary | ICD-10-CM | POA: Diagnosis not present

## 2019-02-11 DIAGNOSIS — I5032 Chronic diastolic (congestive) heart failure: Secondary | ICD-10-CM | POA: Diagnosis not present

## 2019-02-11 DIAGNOSIS — E785 Hyperlipidemia, unspecified: Secondary | ICD-10-CM | POA: Diagnosis not present

## 2019-02-11 DIAGNOSIS — Z89422 Acquired absence of other left toe(s): Secondary | ICD-10-CM | POA: Diagnosis not present

## 2019-02-11 DIAGNOSIS — Z87891 Personal history of nicotine dependence: Secondary | ICD-10-CM | POA: Diagnosis not present

## 2019-02-11 DIAGNOSIS — Z7902 Long term (current) use of antithrombotics/antiplatelets: Secondary | ICD-10-CM | POA: Diagnosis not present

## 2019-02-11 DIAGNOSIS — Z792 Long term (current) use of antibiotics: Secondary | ICD-10-CM | POA: Diagnosis not present

## 2019-02-11 DIAGNOSIS — L97521 Non-pressure chronic ulcer of other part of left foot limited to breakdown of skin: Secondary | ICD-10-CM | POA: Diagnosis not present

## 2019-02-11 DIAGNOSIS — E1151 Type 2 diabetes mellitus with diabetic peripheral angiopathy without gangrene: Secondary | ICD-10-CM | POA: Diagnosis not present

## 2019-02-11 DIAGNOSIS — I2510 Atherosclerotic heart disease of native coronary artery without angina pectoris: Secondary | ICD-10-CM | POA: Diagnosis not present

## 2019-02-11 DIAGNOSIS — I110 Hypertensive heart disease with heart failure: Secondary | ICD-10-CM | POA: Diagnosis not present

## 2019-02-11 DIAGNOSIS — C679 Malignant neoplasm of bladder, unspecified: Secondary | ICD-10-CM

## 2019-02-11 DIAGNOSIS — E1169 Type 2 diabetes mellitus with other specified complication: Secondary | ICD-10-CM | POA: Diagnosis not present

## 2019-02-11 DIAGNOSIS — Z794 Long term (current) use of insulin: Secondary | ICD-10-CM | POA: Diagnosis not present

## 2019-02-11 DIAGNOSIS — M869 Osteomyelitis, unspecified: Secondary | ICD-10-CM | POA: Diagnosis not present

## 2019-02-11 DIAGNOSIS — E1142 Type 2 diabetes mellitus with diabetic polyneuropathy: Secondary | ICD-10-CM | POA: Diagnosis not present

## 2019-02-11 DIAGNOSIS — E11621 Type 2 diabetes mellitus with foot ulcer: Secondary | ICD-10-CM | POA: Diagnosis not present

## 2019-02-11 DIAGNOSIS — Z85528 Personal history of other malignant neoplasm of kidney: Secondary | ICD-10-CM | POA: Diagnosis not present

## 2019-02-11 DIAGNOSIS — Z7982 Long term (current) use of aspirin: Secondary | ICD-10-CM | POA: Diagnosis not present

## 2019-02-11 LAB — URINALYSIS, COMPLETE
Bilirubin, UA: NEGATIVE
Ketones, UA: NEGATIVE
Leukocytes,UA: NEGATIVE
Nitrite, UA: NEGATIVE
Specific Gravity, UA: 1.025 (ref 1.005–1.030)
Urobilinogen, Ur: 0.2 mg/dL (ref 0.2–1.0)
pH, UA: 6.5 (ref 5.0–7.5)

## 2019-02-11 LAB — MICROSCOPIC EXAMINATION: Bacteria, UA: NONE SEEN

## 2019-02-11 MED ORDER — LIDOCAINE HCL URETHRAL/MUCOSAL 2 % EX GEL
1.0000 "application " | Freq: Once | CUTANEOUS | Status: AC
  Administered 2019-02-11: 1 via URETHRAL

## 2019-02-11 MED ORDER — BCG LIVE 50 MG IS SUSR
3.2400 mL | Freq: Once | INTRAVESICAL | Status: AC
  Administered 2019-02-11: 81 mg via INTRAVESICAL

## 2019-02-11 NOTE — Addendum Note (Signed)
Addended by: Donalee Citrin on: 02/11/2019 01:30 PM   Modules accepted: Level of Service

## 2019-02-11 NOTE — Progress Notes (Signed)
BCG Bladder Instillation  BCG # Maintenance   Due to Bladder Cancer patient is present today for a BCG treatment. Patient was cleaned and prepped in a sterile fashion with betadine and lidocaine 2% jelly was instilled into the urethra.  A 14FR catheter was inserted, urine return was noted 104ml, urine was pale yellow in color.  70ml of reconstituted BCG was instilled into the bladder. The catheter was then removed. Patient tolerated well, no complications were noted.   Preformed by: Elberta Leatherwood, [place], CMA  Follow up/ Additional notes: RTC as scheduled

## 2019-02-14 DIAGNOSIS — I2510 Atherosclerotic heart disease of native coronary artery without angina pectoris: Secondary | ICD-10-CM | POA: Diagnosis not present

## 2019-02-14 DIAGNOSIS — E1151 Type 2 diabetes mellitus with diabetic peripheral angiopathy without gangrene: Secondary | ICD-10-CM | POA: Diagnosis not present

## 2019-02-14 DIAGNOSIS — I110 Hypertensive heart disease with heart failure: Secondary | ICD-10-CM | POA: Diagnosis not present

## 2019-02-14 DIAGNOSIS — E1169 Type 2 diabetes mellitus with other specified complication: Secondary | ICD-10-CM | POA: Diagnosis not present

## 2019-02-14 DIAGNOSIS — Z7982 Long term (current) use of aspirin: Secondary | ICD-10-CM | POA: Diagnosis not present

## 2019-02-14 DIAGNOSIS — Z7902 Long term (current) use of antithrombotics/antiplatelets: Secondary | ICD-10-CM | POA: Diagnosis not present

## 2019-02-14 DIAGNOSIS — E11621 Type 2 diabetes mellitus with foot ulcer: Secondary | ICD-10-CM | POA: Diagnosis not present

## 2019-02-14 DIAGNOSIS — Z87891 Personal history of nicotine dependence: Secondary | ICD-10-CM | POA: Diagnosis not present

## 2019-02-14 DIAGNOSIS — E1142 Type 2 diabetes mellitus with diabetic polyneuropathy: Secondary | ICD-10-CM | POA: Diagnosis not present

## 2019-02-14 DIAGNOSIS — Z794 Long term (current) use of insulin: Secondary | ICD-10-CM | POA: Diagnosis not present

## 2019-02-14 DIAGNOSIS — G4700 Insomnia, unspecified: Secondary | ICD-10-CM | POA: Diagnosis not present

## 2019-02-14 DIAGNOSIS — Z85528 Personal history of other malignant neoplasm of kidney: Secondary | ICD-10-CM | POA: Diagnosis not present

## 2019-02-14 DIAGNOSIS — Z792 Long term (current) use of antibiotics: Secondary | ICD-10-CM | POA: Diagnosis not present

## 2019-02-14 DIAGNOSIS — L97521 Non-pressure chronic ulcer of other part of left foot limited to breakdown of skin: Secondary | ICD-10-CM | POA: Diagnosis not present

## 2019-02-14 DIAGNOSIS — E785 Hyperlipidemia, unspecified: Secondary | ICD-10-CM | POA: Diagnosis not present

## 2019-02-14 DIAGNOSIS — Z89421 Acquired absence of other right toe(s): Secondary | ICD-10-CM | POA: Diagnosis not present

## 2019-02-14 DIAGNOSIS — Z89422 Acquired absence of other left toe(s): Secondary | ICD-10-CM | POA: Diagnosis not present

## 2019-02-14 DIAGNOSIS — I5032 Chronic diastolic (congestive) heart failure: Secondary | ICD-10-CM | POA: Diagnosis not present

## 2019-02-14 DIAGNOSIS — M869 Osteomyelitis, unspecified: Secondary | ICD-10-CM | POA: Diagnosis not present

## 2019-02-16 DIAGNOSIS — M869 Osteomyelitis, unspecified: Secondary | ICD-10-CM | POA: Diagnosis not present

## 2019-02-16 DIAGNOSIS — E1151 Type 2 diabetes mellitus with diabetic peripheral angiopathy without gangrene: Secondary | ICD-10-CM | POA: Diagnosis not present

## 2019-02-16 DIAGNOSIS — Z794 Long term (current) use of insulin: Secondary | ICD-10-CM | POA: Diagnosis not present

## 2019-02-16 DIAGNOSIS — Z89422 Acquired absence of other left toe(s): Secondary | ICD-10-CM | POA: Diagnosis not present

## 2019-02-16 DIAGNOSIS — Z7902 Long term (current) use of antithrombotics/antiplatelets: Secondary | ICD-10-CM | POA: Diagnosis not present

## 2019-02-16 DIAGNOSIS — G4700 Insomnia, unspecified: Secondary | ICD-10-CM | POA: Diagnosis not present

## 2019-02-16 DIAGNOSIS — L97521 Non-pressure chronic ulcer of other part of left foot limited to breakdown of skin: Secondary | ICD-10-CM | POA: Diagnosis not present

## 2019-02-16 DIAGNOSIS — Z7982 Long term (current) use of aspirin: Secondary | ICD-10-CM | POA: Diagnosis not present

## 2019-02-16 DIAGNOSIS — Z87891 Personal history of nicotine dependence: Secondary | ICD-10-CM | POA: Diagnosis not present

## 2019-02-16 DIAGNOSIS — I110 Hypertensive heart disease with heart failure: Secondary | ICD-10-CM | POA: Diagnosis not present

## 2019-02-16 DIAGNOSIS — Z85528 Personal history of other malignant neoplasm of kidney: Secondary | ICD-10-CM | POA: Diagnosis not present

## 2019-02-16 DIAGNOSIS — E1142 Type 2 diabetes mellitus with diabetic polyneuropathy: Secondary | ICD-10-CM | POA: Diagnosis not present

## 2019-02-16 DIAGNOSIS — Z792 Long term (current) use of antibiotics: Secondary | ICD-10-CM | POA: Diagnosis not present

## 2019-02-16 DIAGNOSIS — I2510 Atherosclerotic heart disease of native coronary artery without angina pectoris: Secondary | ICD-10-CM | POA: Diagnosis not present

## 2019-02-16 DIAGNOSIS — Z89421 Acquired absence of other right toe(s): Secondary | ICD-10-CM | POA: Diagnosis not present

## 2019-02-16 DIAGNOSIS — E785 Hyperlipidemia, unspecified: Secondary | ICD-10-CM | POA: Diagnosis not present

## 2019-02-16 DIAGNOSIS — I5032 Chronic diastolic (congestive) heart failure: Secondary | ICD-10-CM | POA: Diagnosis not present

## 2019-02-16 DIAGNOSIS — E1169 Type 2 diabetes mellitus with other specified complication: Secondary | ICD-10-CM | POA: Diagnosis not present

## 2019-02-16 DIAGNOSIS — E11621 Type 2 diabetes mellitus with foot ulcer: Secondary | ICD-10-CM | POA: Diagnosis not present

## 2019-02-18 ENCOUNTER — Other Ambulatory Visit: Payer: Self-pay

## 2019-02-18 ENCOUNTER — Ambulatory Visit (INDEPENDENT_AMBULATORY_CARE_PROVIDER_SITE_OTHER): Payer: Medicare Other

## 2019-02-18 DIAGNOSIS — Z7982 Long term (current) use of aspirin: Secondary | ICD-10-CM | POA: Diagnosis not present

## 2019-02-18 DIAGNOSIS — Z7902 Long term (current) use of antithrombotics/antiplatelets: Secondary | ICD-10-CM | POA: Diagnosis not present

## 2019-02-18 DIAGNOSIS — Z85528 Personal history of other malignant neoplasm of kidney: Secondary | ICD-10-CM | POA: Diagnosis not present

## 2019-02-18 DIAGNOSIS — Z89422 Acquired absence of other left toe(s): Secondary | ICD-10-CM | POA: Diagnosis not present

## 2019-02-18 DIAGNOSIS — Z89421 Acquired absence of other right toe(s): Secondary | ICD-10-CM | POA: Diagnosis not present

## 2019-02-18 DIAGNOSIS — Z794 Long term (current) use of insulin: Secondary | ICD-10-CM | POA: Diagnosis not present

## 2019-02-18 DIAGNOSIS — M869 Osteomyelitis, unspecified: Secondary | ICD-10-CM | POA: Diagnosis not present

## 2019-02-18 DIAGNOSIS — G4700 Insomnia, unspecified: Secondary | ICD-10-CM | POA: Diagnosis not present

## 2019-02-18 DIAGNOSIS — I5032 Chronic diastolic (congestive) heart failure: Secondary | ICD-10-CM | POA: Diagnosis not present

## 2019-02-18 DIAGNOSIS — E785 Hyperlipidemia, unspecified: Secondary | ICD-10-CM | POA: Diagnosis not present

## 2019-02-18 DIAGNOSIS — E11621 Type 2 diabetes mellitus with foot ulcer: Secondary | ICD-10-CM | POA: Diagnosis not present

## 2019-02-18 DIAGNOSIS — I110 Hypertensive heart disease with heart failure: Secondary | ICD-10-CM | POA: Diagnosis not present

## 2019-02-18 DIAGNOSIS — E1142 Type 2 diabetes mellitus with diabetic polyneuropathy: Secondary | ICD-10-CM | POA: Diagnosis not present

## 2019-02-18 DIAGNOSIS — C679 Malignant neoplasm of bladder, unspecified: Secondary | ICD-10-CM

## 2019-02-18 DIAGNOSIS — Z792 Long term (current) use of antibiotics: Secondary | ICD-10-CM | POA: Diagnosis not present

## 2019-02-18 DIAGNOSIS — L97521 Non-pressure chronic ulcer of other part of left foot limited to breakdown of skin: Secondary | ICD-10-CM | POA: Diagnosis not present

## 2019-02-18 DIAGNOSIS — E1169 Type 2 diabetes mellitus with other specified complication: Secondary | ICD-10-CM | POA: Diagnosis not present

## 2019-02-18 DIAGNOSIS — E1151 Type 2 diabetes mellitus with diabetic peripheral angiopathy without gangrene: Secondary | ICD-10-CM | POA: Diagnosis not present

## 2019-02-18 DIAGNOSIS — I2510 Atherosclerotic heart disease of native coronary artery without angina pectoris: Secondary | ICD-10-CM | POA: Diagnosis not present

## 2019-02-18 DIAGNOSIS — Z87891 Personal history of nicotine dependence: Secondary | ICD-10-CM | POA: Diagnosis not present

## 2019-02-18 LAB — MICROSCOPIC EXAMINATION: Bacteria, UA: NONE SEEN

## 2019-02-18 LAB — URINALYSIS, COMPLETE
Bilirubin, UA: NEGATIVE
Ketones, UA: NEGATIVE
Leukocytes,UA: NEGATIVE
Nitrite, UA: NEGATIVE
Protein,UA: NEGATIVE
Specific Gravity, UA: 1.01 (ref 1.005–1.030)
Urobilinogen, Ur: 0.2 mg/dL (ref 0.2–1.0)
pH, UA: 5.5 (ref 5.0–7.5)

## 2019-02-18 MED ORDER — BCG LIVE 50 MG IS SUSR
3.2400 mL | Freq: Once | INTRAVESICAL | Status: AC
  Administered 2019-02-18: 81 mg via INTRAVESICAL

## 2019-02-18 NOTE — Progress Notes (Signed)
BCG Bladder Instillation  BCG # 2 maint.  Due to Bladder Cancer patient is present today for a BCG treatment. Patient was cleaned and prepped in a sterile fashion with betadine and lidocaine 2% jelly was instilled into the urethra.  A 14FR catheter was inserted, urine return was noted 148ml, urine was yellow in color.  40ml of reconstituted BCG was instilled into the bladder. The catheter was then removed. Patient tolerated well, no complications were noted  Preformed by: Launa Flight and Fonnie Jarvis, CMA  Follow up/ Additional notes: 1 week BCG  Patient states she is having bladder spasms after last treatment and oxybutinin gave her dry mouth. Patient was given 2 weeks of Myrbetriq 25mg  samples to help with bladder spasms during treatment.

## 2019-02-25 ENCOUNTER — Other Ambulatory Visit: Payer: Self-pay

## 2019-02-25 ENCOUNTER — Ambulatory Visit (INDEPENDENT_AMBULATORY_CARE_PROVIDER_SITE_OTHER): Payer: Medicare Other | Admitting: *Deleted

## 2019-02-25 DIAGNOSIS — Z8551 Personal history of malignant neoplasm of bladder: Secondary | ICD-10-CM | POA: Diagnosis not present

## 2019-02-25 LAB — MICROSCOPIC EXAMINATION
Bacteria, UA: NONE SEEN
RBC, Urine: NONE SEEN /hpf (ref 0–2)

## 2019-02-25 LAB — URINALYSIS, COMPLETE
Bilirubin, UA: NEGATIVE
Glucose, UA: NEGATIVE
Ketones, UA: NEGATIVE
Nitrite, UA: NEGATIVE
Protein,UA: NEGATIVE
RBC, UA: NEGATIVE
Specific Gravity, UA: 1.01 (ref 1.005–1.030)
Urobilinogen, Ur: 0.2 mg/dL (ref 0.2–1.0)
pH, UA: 6 (ref 5.0–7.5)

## 2019-02-25 MED ORDER — BCG LIVE 50 MG IS SUSR
3.2400 mL | Freq: Once | INTRAVESICAL | Status: AC
  Administered 2019-02-25: 81 mg via INTRAVESICAL

## 2019-02-25 NOTE — Progress Notes (Signed)
BCG Bladder Instillation  BCG # 3 Monthly Maintence  Due to Bladder Cancer patient is present today for a BCG treatment. Patient was cleaned and prepped in a sterile fashion with betadine and lidocaine 2% jelly was instilled into the urethra.  A 14FR catheter was inserted, urine return was noted 45ml, urine was yellow in color.  15ml of reconstituted BCG was instilled into the bladder. The catheter was then removed. Patient tolerated well, no complications were noted  Preformed by: [place], CMA  Follow up/ Additional notes: 3 months

## 2019-02-28 DIAGNOSIS — M869 Osteomyelitis, unspecified: Secondary | ICD-10-CM | POA: Diagnosis not present

## 2019-02-28 DIAGNOSIS — Z87891 Personal history of nicotine dependence: Secondary | ICD-10-CM | POA: Diagnosis not present

## 2019-02-28 DIAGNOSIS — E1151 Type 2 diabetes mellitus with diabetic peripheral angiopathy without gangrene: Secondary | ICD-10-CM | POA: Diagnosis not present

## 2019-02-28 DIAGNOSIS — E11621 Type 2 diabetes mellitus with foot ulcer: Secondary | ICD-10-CM | POA: Diagnosis not present

## 2019-02-28 DIAGNOSIS — Z792 Long term (current) use of antibiotics: Secondary | ICD-10-CM | POA: Diagnosis not present

## 2019-02-28 DIAGNOSIS — L97521 Non-pressure chronic ulcer of other part of left foot limited to breakdown of skin: Secondary | ICD-10-CM | POA: Diagnosis not present

## 2019-02-28 DIAGNOSIS — Z89421 Acquired absence of other right toe(s): Secondary | ICD-10-CM | POA: Diagnosis not present

## 2019-02-28 DIAGNOSIS — E785 Hyperlipidemia, unspecified: Secondary | ICD-10-CM | POA: Diagnosis not present

## 2019-02-28 DIAGNOSIS — I110 Hypertensive heart disease with heart failure: Secondary | ICD-10-CM | POA: Diagnosis not present

## 2019-02-28 DIAGNOSIS — Z89422 Acquired absence of other left toe(s): Secondary | ICD-10-CM | POA: Diagnosis not present

## 2019-02-28 DIAGNOSIS — Z7982 Long term (current) use of aspirin: Secondary | ICD-10-CM | POA: Diagnosis not present

## 2019-02-28 DIAGNOSIS — I5032 Chronic diastolic (congestive) heart failure: Secondary | ICD-10-CM | POA: Diagnosis not present

## 2019-02-28 DIAGNOSIS — Z794 Long term (current) use of insulin: Secondary | ICD-10-CM | POA: Diagnosis not present

## 2019-02-28 DIAGNOSIS — I2510 Atherosclerotic heart disease of native coronary artery without angina pectoris: Secondary | ICD-10-CM | POA: Diagnosis not present

## 2019-02-28 DIAGNOSIS — E1169 Type 2 diabetes mellitus with other specified complication: Secondary | ICD-10-CM | POA: Diagnosis not present

## 2019-02-28 DIAGNOSIS — G4700 Insomnia, unspecified: Secondary | ICD-10-CM | POA: Diagnosis not present

## 2019-02-28 DIAGNOSIS — E1142 Type 2 diabetes mellitus with diabetic polyneuropathy: Secondary | ICD-10-CM | POA: Diagnosis not present

## 2019-02-28 DIAGNOSIS — Z7902 Long term (current) use of antithrombotics/antiplatelets: Secondary | ICD-10-CM | POA: Diagnosis not present

## 2019-02-28 DIAGNOSIS — Z85528 Personal history of other malignant neoplasm of kidney: Secondary | ICD-10-CM | POA: Diagnosis not present

## 2019-03-03 DIAGNOSIS — Z794 Long term (current) use of insulin: Secondary | ICD-10-CM | POA: Diagnosis not present

## 2019-03-03 DIAGNOSIS — E1169 Type 2 diabetes mellitus with other specified complication: Secondary | ICD-10-CM | POA: Diagnosis not present

## 2019-03-03 DIAGNOSIS — L97521 Non-pressure chronic ulcer of other part of left foot limited to breakdown of skin: Secondary | ICD-10-CM | POA: Diagnosis not present

## 2019-03-03 DIAGNOSIS — E1151 Type 2 diabetes mellitus with diabetic peripheral angiopathy without gangrene: Secondary | ICD-10-CM | POA: Diagnosis not present

## 2019-03-03 DIAGNOSIS — E1142 Type 2 diabetes mellitus with diabetic polyneuropathy: Secondary | ICD-10-CM | POA: Diagnosis not present

## 2019-03-03 DIAGNOSIS — Z89422 Acquired absence of other left toe(s): Secondary | ICD-10-CM | POA: Diagnosis not present

## 2019-03-03 DIAGNOSIS — I2510 Atherosclerotic heart disease of native coronary artery without angina pectoris: Secondary | ICD-10-CM | POA: Diagnosis not present

## 2019-03-03 DIAGNOSIS — Z87891 Personal history of nicotine dependence: Secondary | ICD-10-CM | POA: Diagnosis not present

## 2019-03-03 DIAGNOSIS — I110 Hypertensive heart disease with heart failure: Secondary | ICD-10-CM | POA: Diagnosis not present

## 2019-03-03 DIAGNOSIS — M869 Osteomyelitis, unspecified: Secondary | ICD-10-CM | POA: Diagnosis not present

## 2019-03-03 DIAGNOSIS — G4700 Insomnia, unspecified: Secondary | ICD-10-CM | POA: Diagnosis not present

## 2019-03-03 DIAGNOSIS — Z7982 Long term (current) use of aspirin: Secondary | ICD-10-CM | POA: Diagnosis not present

## 2019-03-03 DIAGNOSIS — Z89421 Acquired absence of other right toe(s): Secondary | ICD-10-CM | POA: Diagnosis not present

## 2019-03-03 DIAGNOSIS — Z7902 Long term (current) use of antithrombotics/antiplatelets: Secondary | ICD-10-CM | POA: Diagnosis not present

## 2019-03-03 DIAGNOSIS — E11621 Type 2 diabetes mellitus with foot ulcer: Secondary | ICD-10-CM | POA: Diagnosis not present

## 2019-03-03 DIAGNOSIS — Z85528 Personal history of other malignant neoplasm of kidney: Secondary | ICD-10-CM | POA: Diagnosis not present

## 2019-03-03 DIAGNOSIS — E785 Hyperlipidemia, unspecified: Secondary | ICD-10-CM | POA: Diagnosis not present

## 2019-03-03 DIAGNOSIS — I5032 Chronic diastolic (congestive) heart failure: Secondary | ICD-10-CM | POA: Diagnosis not present

## 2019-03-03 DIAGNOSIS — Z792 Long term (current) use of antibiotics: Secondary | ICD-10-CM | POA: Diagnosis not present

## 2019-03-07 DIAGNOSIS — G4700 Insomnia, unspecified: Secondary | ICD-10-CM | POA: Diagnosis not present

## 2019-03-07 DIAGNOSIS — Z89421 Acquired absence of other right toe(s): Secondary | ICD-10-CM | POA: Diagnosis not present

## 2019-03-07 DIAGNOSIS — I2510 Atherosclerotic heart disease of native coronary artery without angina pectoris: Secondary | ICD-10-CM | POA: Diagnosis not present

## 2019-03-07 DIAGNOSIS — I110 Hypertensive heart disease with heart failure: Secondary | ICD-10-CM | POA: Diagnosis not present

## 2019-03-07 DIAGNOSIS — Z794 Long term (current) use of insulin: Secondary | ICD-10-CM | POA: Diagnosis not present

## 2019-03-07 DIAGNOSIS — I5032 Chronic diastolic (congestive) heart failure: Secondary | ICD-10-CM | POA: Diagnosis not present

## 2019-03-07 DIAGNOSIS — Z792 Long term (current) use of antibiotics: Secondary | ICD-10-CM | POA: Diagnosis not present

## 2019-03-07 DIAGNOSIS — E785 Hyperlipidemia, unspecified: Secondary | ICD-10-CM | POA: Diagnosis not present

## 2019-03-07 DIAGNOSIS — E1169 Type 2 diabetes mellitus with other specified complication: Secondary | ICD-10-CM | POA: Diagnosis not present

## 2019-03-07 DIAGNOSIS — Z89422 Acquired absence of other left toe(s): Secondary | ICD-10-CM | POA: Diagnosis not present

## 2019-03-07 DIAGNOSIS — L97521 Non-pressure chronic ulcer of other part of left foot limited to breakdown of skin: Secondary | ICD-10-CM | POA: Diagnosis not present

## 2019-03-07 DIAGNOSIS — Z7982 Long term (current) use of aspirin: Secondary | ICD-10-CM | POA: Diagnosis not present

## 2019-03-07 DIAGNOSIS — Z7902 Long term (current) use of antithrombotics/antiplatelets: Secondary | ICD-10-CM | POA: Diagnosis not present

## 2019-03-07 DIAGNOSIS — E1142 Type 2 diabetes mellitus with diabetic polyneuropathy: Secondary | ICD-10-CM | POA: Diagnosis not present

## 2019-03-07 DIAGNOSIS — E1151 Type 2 diabetes mellitus with diabetic peripheral angiopathy without gangrene: Secondary | ICD-10-CM | POA: Diagnosis not present

## 2019-03-07 DIAGNOSIS — E11621 Type 2 diabetes mellitus with foot ulcer: Secondary | ICD-10-CM | POA: Diagnosis not present

## 2019-03-07 DIAGNOSIS — M869 Osteomyelitis, unspecified: Secondary | ICD-10-CM | POA: Diagnosis not present

## 2019-03-07 DIAGNOSIS — Z87891 Personal history of nicotine dependence: Secondary | ICD-10-CM | POA: Diagnosis not present

## 2019-03-07 DIAGNOSIS — Z85528 Personal history of other malignant neoplasm of kidney: Secondary | ICD-10-CM | POA: Diagnosis not present

## 2019-03-10 ENCOUNTER — Encounter (INDEPENDENT_AMBULATORY_CARE_PROVIDER_SITE_OTHER): Payer: Medicare Other

## 2019-03-10 ENCOUNTER — Ambulatory Visit (INDEPENDENT_AMBULATORY_CARE_PROVIDER_SITE_OTHER): Payer: Medicare Other | Admitting: Vascular Surgery

## 2019-03-10 ENCOUNTER — Other Ambulatory Visit: Payer: Self-pay

## 2019-03-10 DIAGNOSIS — E1142 Type 2 diabetes mellitus with diabetic polyneuropathy: Secondary | ICD-10-CM | POA: Diagnosis not present

## 2019-03-10 DIAGNOSIS — I110 Hypertensive heart disease with heart failure: Secondary | ICD-10-CM | POA: Diagnosis not present

## 2019-03-10 DIAGNOSIS — Z87891 Personal history of nicotine dependence: Secondary | ICD-10-CM | POA: Diagnosis not present

## 2019-03-10 DIAGNOSIS — Z794 Long term (current) use of insulin: Secondary | ICD-10-CM | POA: Diagnosis not present

## 2019-03-10 DIAGNOSIS — E1151 Type 2 diabetes mellitus with diabetic peripheral angiopathy without gangrene: Secondary | ICD-10-CM | POA: Diagnosis not present

## 2019-03-10 DIAGNOSIS — M869 Osteomyelitis, unspecified: Secondary | ICD-10-CM | POA: Diagnosis not present

## 2019-03-10 DIAGNOSIS — Z85528 Personal history of other malignant neoplasm of kidney: Secondary | ICD-10-CM | POA: Diagnosis not present

## 2019-03-10 DIAGNOSIS — L97521 Non-pressure chronic ulcer of other part of left foot limited to breakdown of skin: Secondary | ICD-10-CM | POA: Diagnosis not present

## 2019-03-10 DIAGNOSIS — Z792 Long term (current) use of antibiotics: Secondary | ICD-10-CM | POA: Diagnosis not present

## 2019-03-10 DIAGNOSIS — E1169 Type 2 diabetes mellitus with other specified complication: Secondary | ICD-10-CM | POA: Diagnosis not present

## 2019-03-10 DIAGNOSIS — Z7982 Long term (current) use of aspirin: Secondary | ICD-10-CM | POA: Diagnosis not present

## 2019-03-10 DIAGNOSIS — I2510 Atherosclerotic heart disease of native coronary artery without angina pectoris: Secondary | ICD-10-CM | POA: Diagnosis not present

## 2019-03-10 DIAGNOSIS — Z89421 Acquired absence of other right toe(s): Secondary | ICD-10-CM | POA: Diagnosis not present

## 2019-03-10 DIAGNOSIS — Z7902 Long term (current) use of antithrombotics/antiplatelets: Secondary | ICD-10-CM | POA: Diagnosis not present

## 2019-03-10 DIAGNOSIS — E11621 Type 2 diabetes mellitus with foot ulcer: Secondary | ICD-10-CM | POA: Diagnosis not present

## 2019-03-10 DIAGNOSIS — I5032 Chronic diastolic (congestive) heart failure: Secondary | ICD-10-CM | POA: Diagnosis not present

## 2019-03-10 DIAGNOSIS — E785 Hyperlipidemia, unspecified: Secondary | ICD-10-CM | POA: Diagnosis not present

## 2019-03-10 DIAGNOSIS — G4700 Insomnia, unspecified: Secondary | ICD-10-CM | POA: Diagnosis not present

## 2019-03-10 DIAGNOSIS — Z89422 Acquired absence of other left toe(s): Secondary | ICD-10-CM | POA: Diagnosis not present

## 2019-03-10 MED ORDER — ACCU-CHEK AVIVA PLUS VI STRP: ORAL_STRIP | 1 refills | Status: DC

## 2019-03-14 DIAGNOSIS — Z89422 Acquired absence of other left toe(s): Secondary | ICD-10-CM | POA: Diagnosis not present

## 2019-03-14 DIAGNOSIS — E1142 Type 2 diabetes mellitus with diabetic polyneuropathy: Secondary | ICD-10-CM | POA: Diagnosis not present

## 2019-03-14 DIAGNOSIS — E785 Hyperlipidemia, unspecified: Secondary | ICD-10-CM | POA: Diagnosis not present

## 2019-03-14 DIAGNOSIS — Z794 Long term (current) use of insulin: Secondary | ICD-10-CM | POA: Diagnosis not present

## 2019-03-14 DIAGNOSIS — Z85528 Personal history of other malignant neoplasm of kidney: Secondary | ICD-10-CM | POA: Diagnosis not present

## 2019-03-14 DIAGNOSIS — Z89421 Acquired absence of other right toe(s): Secondary | ICD-10-CM | POA: Diagnosis not present

## 2019-03-14 DIAGNOSIS — E1169 Type 2 diabetes mellitus with other specified complication: Secondary | ICD-10-CM | POA: Diagnosis not present

## 2019-03-14 DIAGNOSIS — E1151 Type 2 diabetes mellitus with diabetic peripheral angiopathy without gangrene: Secondary | ICD-10-CM | POA: Diagnosis not present

## 2019-03-14 DIAGNOSIS — Z7902 Long term (current) use of antithrombotics/antiplatelets: Secondary | ICD-10-CM | POA: Diagnosis not present

## 2019-03-14 DIAGNOSIS — E11621 Type 2 diabetes mellitus with foot ulcer: Secondary | ICD-10-CM | POA: Diagnosis not present

## 2019-03-14 DIAGNOSIS — M869 Osteomyelitis, unspecified: Secondary | ICD-10-CM | POA: Diagnosis not present

## 2019-03-14 DIAGNOSIS — Z87891 Personal history of nicotine dependence: Secondary | ICD-10-CM | POA: Diagnosis not present

## 2019-03-14 DIAGNOSIS — I110 Hypertensive heart disease with heart failure: Secondary | ICD-10-CM | POA: Diagnosis not present

## 2019-03-14 DIAGNOSIS — I2510 Atherosclerotic heart disease of native coronary artery without angina pectoris: Secondary | ICD-10-CM | POA: Diagnosis not present

## 2019-03-14 DIAGNOSIS — I5032 Chronic diastolic (congestive) heart failure: Secondary | ICD-10-CM | POA: Diagnosis not present

## 2019-03-14 DIAGNOSIS — G4700 Insomnia, unspecified: Secondary | ICD-10-CM | POA: Diagnosis not present

## 2019-03-14 DIAGNOSIS — Z7982 Long term (current) use of aspirin: Secondary | ICD-10-CM | POA: Diagnosis not present

## 2019-03-14 DIAGNOSIS — Z792 Long term (current) use of antibiotics: Secondary | ICD-10-CM | POA: Diagnosis not present

## 2019-03-14 DIAGNOSIS — L97521 Non-pressure chronic ulcer of other part of left foot limited to breakdown of skin: Secondary | ICD-10-CM | POA: Diagnosis not present

## 2019-03-18 DIAGNOSIS — E1151 Type 2 diabetes mellitus with diabetic peripheral angiopathy without gangrene: Secondary | ICD-10-CM | POA: Diagnosis not present

## 2019-03-18 DIAGNOSIS — E11621 Type 2 diabetes mellitus with foot ulcer: Secondary | ICD-10-CM | POA: Diagnosis not present

## 2019-03-18 DIAGNOSIS — Z85528 Personal history of other malignant neoplasm of kidney: Secondary | ICD-10-CM | POA: Diagnosis not present

## 2019-03-18 DIAGNOSIS — Z7982 Long term (current) use of aspirin: Secondary | ICD-10-CM | POA: Diagnosis not present

## 2019-03-18 DIAGNOSIS — E785 Hyperlipidemia, unspecified: Secondary | ICD-10-CM | POA: Diagnosis not present

## 2019-03-18 DIAGNOSIS — Z89421 Acquired absence of other right toe(s): Secondary | ICD-10-CM | POA: Diagnosis not present

## 2019-03-18 DIAGNOSIS — I110 Hypertensive heart disease with heart failure: Secondary | ICD-10-CM | POA: Diagnosis not present

## 2019-03-18 DIAGNOSIS — Z794 Long term (current) use of insulin: Secondary | ICD-10-CM | POA: Diagnosis not present

## 2019-03-18 DIAGNOSIS — Z7902 Long term (current) use of antithrombotics/antiplatelets: Secondary | ICD-10-CM | POA: Diagnosis not present

## 2019-03-18 DIAGNOSIS — G4700 Insomnia, unspecified: Secondary | ICD-10-CM | POA: Diagnosis not present

## 2019-03-18 DIAGNOSIS — Z87891 Personal history of nicotine dependence: Secondary | ICD-10-CM | POA: Diagnosis not present

## 2019-03-18 DIAGNOSIS — I5032 Chronic diastolic (congestive) heart failure: Secondary | ICD-10-CM | POA: Diagnosis not present

## 2019-03-18 DIAGNOSIS — L97521 Non-pressure chronic ulcer of other part of left foot limited to breakdown of skin: Secondary | ICD-10-CM | POA: Diagnosis not present

## 2019-03-18 DIAGNOSIS — M869 Osteomyelitis, unspecified: Secondary | ICD-10-CM | POA: Diagnosis not present

## 2019-03-18 DIAGNOSIS — E1142 Type 2 diabetes mellitus with diabetic polyneuropathy: Secondary | ICD-10-CM | POA: Diagnosis not present

## 2019-03-18 DIAGNOSIS — E1169 Type 2 diabetes mellitus with other specified complication: Secondary | ICD-10-CM | POA: Diagnosis not present

## 2019-03-18 DIAGNOSIS — Z89422 Acquired absence of other left toe(s): Secondary | ICD-10-CM | POA: Diagnosis not present

## 2019-03-18 DIAGNOSIS — Z792 Long term (current) use of antibiotics: Secondary | ICD-10-CM | POA: Diagnosis not present

## 2019-03-18 DIAGNOSIS — I2510 Atherosclerotic heart disease of native coronary artery without angina pectoris: Secondary | ICD-10-CM | POA: Diagnosis not present

## 2019-03-23 DIAGNOSIS — I5032 Chronic diastolic (congestive) heart failure: Secondary | ICD-10-CM | POA: Diagnosis not present

## 2019-03-23 DIAGNOSIS — E1142 Type 2 diabetes mellitus with diabetic polyneuropathy: Secondary | ICD-10-CM | POA: Diagnosis not present

## 2019-03-23 DIAGNOSIS — E1151 Type 2 diabetes mellitus with diabetic peripheral angiopathy without gangrene: Secondary | ICD-10-CM | POA: Diagnosis not present

## 2019-03-23 DIAGNOSIS — Z794 Long term (current) use of insulin: Secondary | ICD-10-CM | POA: Diagnosis not present

## 2019-03-23 DIAGNOSIS — Z89421 Acquired absence of other right toe(s): Secondary | ICD-10-CM | POA: Diagnosis not present

## 2019-03-23 DIAGNOSIS — Z87891 Personal history of nicotine dependence: Secondary | ICD-10-CM | POA: Diagnosis not present

## 2019-03-23 DIAGNOSIS — L97521 Non-pressure chronic ulcer of other part of left foot limited to breakdown of skin: Secondary | ICD-10-CM | POA: Diagnosis not present

## 2019-03-23 DIAGNOSIS — E785 Hyperlipidemia, unspecified: Secondary | ICD-10-CM | POA: Diagnosis not present

## 2019-03-23 DIAGNOSIS — I110 Hypertensive heart disease with heart failure: Secondary | ICD-10-CM | POA: Diagnosis not present

## 2019-03-23 DIAGNOSIS — I2510 Atherosclerotic heart disease of native coronary artery without angina pectoris: Secondary | ICD-10-CM | POA: Diagnosis not present

## 2019-03-23 DIAGNOSIS — E1169 Type 2 diabetes mellitus with other specified complication: Secondary | ICD-10-CM | POA: Diagnosis not present

## 2019-03-23 DIAGNOSIS — E11621 Type 2 diabetes mellitus with foot ulcer: Secondary | ICD-10-CM | POA: Diagnosis not present

## 2019-03-23 DIAGNOSIS — M869 Osteomyelitis, unspecified: Secondary | ICD-10-CM | POA: Diagnosis not present

## 2019-03-23 DIAGNOSIS — Z89422 Acquired absence of other left toe(s): Secondary | ICD-10-CM | POA: Diagnosis not present

## 2019-03-23 DIAGNOSIS — Z792 Long term (current) use of antibiotics: Secondary | ICD-10-CM | POA: Diagnosis not present

## 2019-03-23 DIAGNOSIS — Z85528 Personal history of other malignant neoplasm of kidney: Secondary | ICD-10-CM | POA: Diagnosis not present

## 2019-03-23 DIAGNOSIS — Z7982 Long term (current) use of aspirin: Secondary | ICD-10-CM | POA: Diagnosis not present

## 2019-03-23 DIAGNOSIS — Z7902 Long term (current) use of antithrombotics/antiplatelets: Secondary | ICD-10-CM | POA: Diagnosis not present

## 2019-03-23 DIAGNOSIS — G4700 Insomnia, unspecified: Secondary | ICD-10-CM | POA: Diagnosis not present

## 2019-03-28 DIAGNOSIS — Z87891 Personal history of nicotine dependence: Secondary | ICD-10-CM | POA: Diagnosis not present

## 2019-03-28 DIAGNOSIS — E785 Hyperlipidemia, unspecified: Secondary | ICD-10-CM | POA: Diagnosis not present

## 2019-03-28 DIAGNOSIS — I2510 Atherosclerotic heart disease of native coronary artery without angina pectoris: Secondary | ICD-10-CM | POA: Diagnosis not present

## 2019-03-28 DIAGNOSIS — Z89422 Acquired absence of other left toe(s): Secondary | ICD-10-CM | POA: Diagnosis not present

## 2019-03-28 DIAGNOSIS — G4700 Insomnia, unspecified: Secondary | ICD-10-CM | POA: Diagnosis not present

## 2019-03-28 DIAGNOSIS — I110 Hypertensive heart disease with heart failure: Secondary | ICD-10-CM | POA: Diagnosis not present

## 2019-03-28 DIAGNOSIS — Z794 Long term (current) use of insulin: Secondary | ICD-10-CM | POA: Diagnosis not present

## 2019-03-28 DIAGNOSIS — E11621 Type 2 diabetes mellitus with foot ulcer: Secondary | ICD-10-CM | POA: Diagnosis not present

## 2019-03-28 DIAGNOSIS — L97522 Non-pressure chronic ulcer of other part of left foot with fat layer exposed: Secondary | ICD-10-CM | POA: Diagnosis not present

## 2019-03-28 DIAGNOSIS — Z7902 Long term (current) use of antithrombotics/antiplatelets: Secondary | ICD-10-CM | POA: Diagnosis not present

## 2019-03-28 DIAGNOSIS — Z792 Long term (current) use of antibiotics: Secondary | ICD-10-CM | POA: Diagnosis not present

## 2019-03-28 DIAGNOSIS — E1169 Type 2 diabetes mellitus with other specified complication: Secondary | ICD-10-CM | POA: Diagnosis not present

## 2019-03-28 DIAGNOSIS — E1142 Type 2 diabetes mellitus with diabetic polyneuropathy: Secondary | ICD-10-CM | POA: Diagnosis not present

## 2019-03-28 DIAGNOSIS — Z89421 Acquired absence of other right toe(s): Secondary | ICD-10-CM | POA: Diagnosis not present

## 2019-03-28 DIAGNOSIS — I5032 Chronic diastolic (congestive) heart failure: Secondary | ICD-10-CM | POA: Diagnosis not present

## 2019-03-28 DIAGNOSIS — M869 Osteomyelitis, unspecified: Secondary | ICD-10-CM | POA: Diagnosis not present

## 2019-03-28 DIAGNOSIS — Z85528 Personal history of other malignant neoplasm of kidney: Secondary | ICD-10-CM | POA: Diagnosis not present

## 2019-03-28 DIAGNOSIS — E1151 Type 2 diabetes mellitus with diabetic peripheral angiopathy without gangrene: Secondary | ICD-10-CM | POA: Diagnosis not present

## 2019-03-28 DIAGNOSIS — Z7982 Long term (current) use of aspirin: Secondary | ICD-10-CM | POA: Diagnosis not present

## 2019-03-31 DIAGNOSIS — E11621 Type 2 diabetes mellitus with foot ulcer: Secondary | ICD-10-CM | POA: Diagnosis not present

## 2019-04-04 DIAGNOSIS — E1151 Type 2 diabetes mellitus with diabetic peripheral angiopathy without gangrene: Secondary | ICD-10-CM | POA: Diagnosis not present

## 2019-04-04 DIAGNOSIS — E1142 Type 2 diabetes mellitus with diabetic polyneuropathy: Secondary | ICD-10-CM | POA: Diagnosis not present

## 2019-04-04 DIAGNOSIS — Z85528 Personal history of other malignant neoplasm of kidney: Secondary | ICD-10-CM | POA: Diagnosis not present

## 2019-04-04 DIAGNOSIS — Z792 Long term (current) use of antibiotics: Secondary | ICD-10-CM | POA: Diagnosis not present

## 2019-04-04 DIAGNOSIS — I110 Hypertensive heart disease with heart failure: Secondary | ICD-10-CM | POA: Diagnosis not present

## 2019-04-04 DIAGNOSIS — Z794 Long term (current) use of insulin: Secondary | ICD-10-CM | POA: Diagnosis not present

## 2019-04-04 DIAGNOSIS — Z7982 Long term (current) use of aspirin: Secondary | ICD-10-CM | POA: Diagnosis not present

## 2019-04-04 DIAGNOSIS — Z89422 Acquired absence of other left toe(s): Secondary | ICD-10-CM | POA: Diagnosis not present

## 2019-04-04 DIAGNOSIS — M869 Osteomyelitis, unspecified: Secondary | ICD-10-CM | POA: Diagnosis not present

## 2019-04-04 DIAGNOSIS — E785 Hyperlipidemia, unspecified: Secondary | ICD-10-CM | POA: Diagnosis not present

## 2019-04-04 DIAGNOSIS — L97522 Non-pressure chronic ulcer of other part of left foot with fat layer exposed: Secondary | ICD-10-CM | POA: Diagnosis not present

## 2019-04-04 DIAGNOSIS — E1169 Type 2 diabetes mellitus with other specified complication: Secondary | ICD-10-CM | POA: Diagnosis not present

## 2019-04-04 DIAGNOSIS — G4700 Insomnia, unspecified: Secondary | ICD-10-CM | POA: Diagnosis not present

## 2019-04-04 DIAGNOSIS — I5032 Chronic diastolic (congestive) heart failure: Secondary | ICD-10-CM | POA: Diagnosis not present

## 2019-04-04 DIAGNOSIS — Z87891 Personal history of nicotine dependence: Secondary | ICD-10-CM | POA: Diagnosis not present

## 2019-04-04 DIAGNOSIS — Z7902 Long term (current) use of antithrombotics/antiplatelets: Secondary | ICD-10-CM | POA: Diagnosis not present

## 2019-04-04 DIAGNOSIS — Z89421 Acquired absence of other right toe(s): Secondary | ICD-10-CM | POA: Diagnosis not present

## 2019-04-04 DIAGNOSIS — I2510 Atherosclerotic heart disease of native coronary artery without angina pectoris: Secondary | ICD-10-CM | POA: Diagnosis not present

## 2019-04-04 DIAGNOSIS — E11621 Type 2 diabetes mellitus with foot ulcer: Secondary | ICD-10-CM | POA: Diagnosis not present

## 2019-04-11 DIAGNOSIS — L97522 Non-pressure chronic ulcer of other part of left foot with fat layer exposed: Secondary | ICD-10-CM | POA: Diagnosis not present

## 2019-04-11 DIAGNOSIS — I2510 Atherosclerotic heart disease of native coronary artery without angina pectoris: Secondary | ICD-10-CM | POA: Diagnosis not present

## 2019-04-11 DIAGNOSIS — E785 Hyperlipidemia, unspecified: Secondary | ICD-10-CM | POA: Diagnosis not present

## 2019-04-11 DIAGNOSIS — M869 Osteomyelitis, unspecified: Secondary | ICD-10-CM | POA: Diagnosis not present

## 2019-04-11 DIAGNOSIS — Z89422 Acquired absence of other left toe(s): Secondary | ICD-10-CM | POA: Diagnosis not present

## 2019-04-11 DIAGNOSIS — E1142 Type 2 diabetes mellitus with diabetic polyneuropathy: Secondary | ICD-10-CM | POA: Diagnosis not present

## 2019-04-11 DIAGNOSIS — G4700 Insomnia, unspecified: Secondary | ICD-10-CM | POA: Diagnosis not present

## 2019-04-11 DIAGNOSIS — I5032 Chronic diastolic (congestive) heart failure: Secondary | ICD-10-CM | POA: Diagnosis not present

## 2019-04-11 DIAGNOSIS — E1151 Type 2 diabetes mellitus with diabetic peripheral angiopathy without gangrene: Secondary | ICD-10-CM | POA: Diagnosis not present

## 2019-04-11 DIAGNOSIS — Z794 Long term (current) use of insulin: Secondary | ICD-10-CM | POA: Diagnosis not present

## 2019-04-11 DIAGNOSIS — Z89421 Acquired absence of other right toe(s): Secondary | ICD-10-CM | POA: Diagnosis not present

## 2019-04-11 DIAGNOSIS — E1169 Type 2 diabetes mellitus with other specified complication: Secondary | ICD-10-CM | POA: Diagnosis not present

## 2019-04-11 DIAGNOSIS — Z792 Long term (current) use of antibiotics: Secondary | ICD-10-CM | POA: Diagnosis not present

## 2019-04-11 DIAGNOSIS — Z87891 Personal history of nicotine dependence: Secondary | ICD-10-CM | POA: Diagnosis not present

## 2019-04-11 DIAGNOSIS — Z85528 Personal history of other malignant neoplasm of kidney: Secondary | ICD-10-CM | POA: Diagnosis not present

## 2019-04-11 DIAGNOSIS — I110 Hypertensive heart disease with heart failure: Secondary | ICD-10-CM | POA: Diagnosis not present

## 2019-04-11 DIAGNOSIS — Z7982 Long term (current) use of aspirin: Secondary | ICD-10-CM | POA: Diagnosis not present

## 2019-04-11 DIAGNOSIS — Z7902 Long term (current) use of antithrombotics/antiplatelets: Secondary | ICD-10-CM | POA: Diagnosis not present

## 2019-04-11 DIAGNOSIS — E11621 Type 2 diabetes mellitus with foot ulcer: Secondary | ICD-10-CM | POA: Diagnosis not present

## 2019-04-18 ENCOUNTER — Ambulatory Visit (INDEPENDENT_AMBULATORY_CARE_PROVIDER_SITE_OTHER): Payer: Medicare Other

## 2019-04-18 ENCOUNTER — Other Ambulatory Visit: Payer: Self-pay

## 2019-04-18 ENCOUNTER — Ambulatory Visit (INDEPENDENT_AMBULATORY_CARE_PROVIDER_SITE_OTHER): Payer: Medicare Other | Admitting: Vascular Surgery

## 2019-04-18 ENCOUNTER — Encounter (INDEPENDENT_AMBULATORY_CARE_PROVIDER_SITE_OTHER): Payer: Self-pay | Admitting: Vascular Surgery

## 2019-04-18 VITALS — BP 151/69 | HR 72 | Resp 16 | Wt 171.0 lb

## 2019-04-18 DIAGNOSIS — G4700 Insomnia, unspecified: Secondary | ICD-10-CM | POA: Diagnosis not present

## 2019-04-18 DIAGNOSIS — I2510 Atherosclerotic heart disease of native coronary artery without angina pectoris: Secondary | ICD-10-CM | POA: Diagnosis not present

## 2019-04-18 DIAGNOSIS — L97522 Non-pressure chronic ulcer of other part of left foot with fat layer exposed: Secondary | ICD-10-CM | POA: Diagnosis not present

## 2019-04-18 DIAGNOSIS — I70213 Atherosclerosis of native arteries of extremities with intermittent claudication, bilateral legs: Secondary | ICD-10-CM | POA: Diagnosis not present

## 2019-04-18 DIAGNOSIS — I6523 Occlusion and stenosis of bilateral carotid arteries: Secondary | ICD-10-CM

## 2019-04-18 DIAGNOSIS — E1151 Type 2 diabetes mellitus with diabetic peripheral angiopathy without gangrene: Secondary | ICD-10-CM | POA: Diagnosis not present

## 2019-04-18 DIAGNOSIS — E1169 Type 2 diabetes mellitus with other specified complication: Secondary | ICD-10-CM | POA: Diagnosis not present

## 2019-04-18 DIAGNOSIS — I25118 Atherosclerotic heart disease of native coronary artery with other forms of angina pectoris: Secondary | ICD-10-CM | POA: Diagnosis not present

## 2019-04-18 DIAGNOSIS — Z85528 Personal history of other malignant neoplasm of kidney: Secondary | ICD-10-CM | POA: Diagnosis not present

## 2019-04-18 DIAGNOSIS — Z89422 Acquired absence of other left toe(s): Secondary | ICD-10-CM | POA: Diagnosis not present

## 2019-04-18 DIAGNOSIS — Z87891 Personal history of nicotine dependence: Secondary | ICD-10-CM | POA: Diagnosis not present

## 2019-04-18 DIAGNOSIS — Z7902 Long term (current) use of antithrombotics/antiplatelets: Secondary | ICD-10-CM | POA: Diagnosis not present

## 2019-04-18 DIAGNOSIS — M869 Osteomyelitis, unspecified: Secondary | ICD-10-CM | POA: Diagnosis not present

## 2019-04-18 DIAGNOSIS — E11621 Type 2 diabetes mellitus with foot ulcer: Secondary | ICD-10-CM | POA: Diagnosis not present

## 2019-04-18 DIAGNOSIS — Z89421 Acquired absence of other right toe(s): Secondary | ICD-10-CM | POA: Diagnosis not present

## 2019-04-18 DIAGNOSIS — I10 Essential (primary) hypertension: Secondary | ICD-10-CM | POA: Diagnosis not present

## 2019-04-18 DIAGNOSIS — Z7982 Long term (current) use of aspirin: Secondary | ICD-10-CM | POA: Diagnosis not present

## 2019-04-18 DIAGNOSIS — E1142 Type 2 diabetes mellitus with diabetic polyneuropathy: Secondary | ICD-10-CM | POA: Diagnosis not present

## 2019-04-18 DIAGNOSIS — Z792 Long term (current) use of antibiotics: Secondary | ICD-10-CM | POA: Diagnosis not present

## 2019-04-18 DIAGNOSIS — E785 Hyperlipidemia, unspecified: Secondary | ICD-10-CM | POA: Diagnosis not present

## 2019-04-18 DIAGNOSIS — I110 Hypertensive heart disease with heart failure: Secondary | ICD-10-CM | POA: Diagnosis not present

## 2019-04-18 DIAGNOSIS — I5032 Chronic diastolic (congestive) heart failure: Secondary | ICD-10-CM | POA: Diagnosis not present

## 2019-04-18 DIAGNOSIS — Z794 Long term (current) use of insulin: Secondary | ICD-10-CM | POA: Diagnosis not present

## 2019-04-18 NOTE — Progress Notes (Signed)
MRN : [id number]  Laura Reed is a 58 y.o. (1961-06-06) female who presents with chief complaint of No chief complaint on file. Marland Kitchen  History of Present Illness:  The patient returns to the office for followup and review status post angiogram with intervention.  She is s/p angiogram on 05/05/2018: 1.Percutaneous transluminal angioplasty and stent placementleftpopliteal 2. Percutaneous transluminal angioplasty of the right anterior tibial artery to 3 mm  The patient notes improvement in the lower extremity symptoms. No interval shortening of the patient's claudication distance or rest pain symptoms. Previous wounds have now healed. No new ulcers or wounds have occurred since the last visit.  There have been no significant changes to the patient's overall health care.  The patient denies amaurosis fugax or recent TIA symptoms. There are no recent neurological changes noted. The patient denies history of DVT, PE or superficial thrombophlebitis. The patient denies recent episodes of angina or shortness of breath.   ABI's Rt=0.82and Lt=0.79(previous Rt=0.81and Lt=0.82) Duplex ultrasound of the bilateral lower extremities shows patent bypasses bilaterally no hemodynamically significant stenosis  Carotid Duplex shows widely patent RICA s/p endarterectomy and [id number] LICA stenosis, no change from last study  No outpatient medications have been marked as taking for the 04/18/19 encounter (Appointment) with Delana Meyer, Dolores Lory, MD.    Past Medical History:  Diagnosis Date  . Anxiety   . Bladder cancer ([place])   . CHF (congestive heart failure) ([place])   . Coronary artery disease   . Diabetes mellitus   . Heart murmur   . Hemorrhoid   . Hypertension   . Neuropathy   . PVD (peripheral vascular disease) ([place])   . Thyroid nodule    right  . Urothelial carcinoma of kidney ([place]) 10/31/2014   INVASIVE UROTHELIAL CARCINOMA, LOW GRADE. T1, Nx.    Past Surgical History:   Procedure Laterality Date  . AMPUTATION TOE     right (4th and 5th); left (great toe, 3rd)  . AMPUTATION TOE Right 07/16/2018   Procedure: AMPUTATION TOE/MPJ right 2nd;  Surgeon: Sharlotte Alamo, DPM;  Location: [facility];  Service: Podiatry;  Laterality: Right;  . ARTERIAL BYPASS SURGRY  2009, 2013 x 2   right leg , done in [place]  . CARDIAC CATHETERIZATION    . CAROTID ENDARTERECTOMY Right 01/2014   Dr Delana Meyer  . CATARACT EXTRACTION W/PHACO Right 12/14/2014   Procedure: CATARACT EXTRACTION PHACO AND INTRAOCULAR LENS PLACEMENT (IOC);  Surgeon: Lyla Glassing, MD;  Location: [facility];  Service: Ophthalmology;  Laterality: Right;  [place]   00:38.6              AP        7.1                   CDE  2.76  . CESAREAN SECTION    . CHOLECYSTECTOMY  03-03-12   Porcelain gallbladder, gallstones,  [place]  . COLONOSCOPY W/ BIOPSIES  04/28/2012   Hyperplastic rectal polyps.  . CORONARY ARTERY BYPASS GRAFT  2009   3 vessel  . CYSTOSCOPY W/ RETROGRADES Right 09/01/2016   Procedure: CYSTOSCOPY WITH RETROGRADE PYELOGRAM;  Surgeon: Hollice Espy, MD;  Location: [facility];  Service: Urology;  Laterality: Right;  . EYE SURGERY    . HERNIA REPAIR  10-31-14   ventral, retro-rectus atrium mesh  . IRRIGATION AND DEBRIDEMENT FOOT Left 01/18/2019   Procedure: IRRIGATION AND DEBRIDEMENT FOOT;  Surgeon: Sharlotte Alamo, DPM;  Location: [facility];  Service: Podiatry;  Laterality: Left;  .  LOWER EXTREMITY ANGIOGRAPHY Left 12/10/2016   Procedure: Lower Extremity Angiography;  Surgeon: Katha Cabal, MD;  Location: [place] [facility];  Service: Cardiovascular;  Laterality: Left;  . LOWER EXTREMITY ANGIOGRAPHY Left 02/02/2018   Procedure: LOWER EXTREMITY ANGIOGRAPHY;  Surgeon: Katha Cabal, MD;  Location: [place] [facility];  Service: Cardiovascular;  Laterality: Left;  . LOWER EXTREMITY ANGIOGRAPHY Left 05/05/2018   Procedure: LOWER EXTREMITY ANGIOGRAPHY;  Surgeon: Katha Cabal, MD;  Location: [place] [facility];  Service: Cardiovascular;  Laterality: Left;  . NEPHRECTOMY Left 10-31-14  . PERIPHERAL VASCULAR CATHETERIZATION Left 05/01/2015   Procedure: Lower Extremity Angiography;  Surgeon: Katha Cabal, MD;  Location: [place] [facility];  Service: Cardiovascular;  Laterality: Left;  . PERIPHERAL VASCULAR CATHETERIZATION  05/01/2015   Procedure: Lower Extremity Intervention;  Surgeon: Katha Cabal, MD;  Location: [place] [facility];  Service: Cardiovascular;;  . PERIPHERAL VASCULAR CATHETERIZATION Left 02/20/2015   Procedure: Pelvic Angiography;  Surgeon: Katha Cabal, MD;  Location: [place] [facility];  Service: Cardiovascular;  Laterality: Left;  . TRANSURETHRAL RESECTION OF BLADDER TUMOR WITH MITOMYCIN-C N/A 09/01/2016   Procedure: TRANSURETHRAL RESECTION OF BLADDER TUMOR WITH MITOMYCIN-C;  Surgeon: Hollice Espy, MD;  Location: [facility];  Service: Urology;  Laterality: N/A;    Social History Social History   Tobacco Use  . Smoking status: Former Smoker    Packs/day: 2.00    Years: 35.00    Pack years: 70.00    Types: Cigarettes    Quit date: 03/30/2011    Years since quitting: 8.0  . Smokeless tobacco: Never Used  Substance Use Topics  . Alcohol use: No    Alcohol/week: 0.0 standard drinks  . Drug use: Not Currently    Types: Cocaine    Comment: last used in 2007    Family History Family History  Problem Relation Age of Onset  . Cancer Mother 37       Lung Cancer  . Cancer Father 28       Lung Ca  . Diabetes Son   . Breast cancer Maternal Grandmother   . Kidney cancer Neg Hx   . Bladder Cancer Neg Hx   . Prostate cancer Neg Hx     No Known Allergies   REVIEW OF SYSTEMS (Negative unless checked)  Constitutional: [] Weight loss  [] Fever  [] Chills Cardiac: [] Chest pain   [] Chest pressure   [] Palpitations   [] Shortness of breath when laying flat   [] Shortness of breath with exertion. Vascular:  [x] Pain in legs with walking   [] Pain in legs at rest   [] History of DVT   [] Phlebitis   [] Swelling in legs   [] Varicose veins   [] Non-healing ulcers Pulmonary:   [] Uses home oxygen   [] Productive cough   [] Hemoptysis   [] Wheeze  [] COPD   [] Asthma Neurologic:  [] Dizziness   [] Seizures   [] History of stroke   [] History of TIA  [] Aphasia   [] Vissual changes   [] Weakness or numbness in arm   [x] Weakness or numbness in leg Musculoskeletal:   [] Joint swelling   [] Joint pain   [] Low back pain Hematologic:  [] Easy bruising  [] Easy bleeding   [] Hypercoagulable state   [] Anemic Gastrointestinal:  [] Diarrhea   [] Vomiting  [] Gastroesophageal reflux/heartburn   [] Difficulty swallowing. Genitourinary:  [] Chronic kidney disease   [] Difficult urination  [] Frequent urination   [] Blood in urine Skin:  [] Rashes   [] Ulcers  Psychological:  [] History of anxiety   []  History of  major depression.  Physical Examination  There were no vitals filed for this visit. There is no height or weight on file to calculate BMI. Gen: WD/WN, NAD Head: [place]/AT, No temporalis wasting.  Ear/Nose/Throat: Hearing grossly intact, nares w/o erythema or drainage Eyes: PER, EOMI, sclera nonicteric.  Neck: Supple, no large masses.   Pulmonary:  Good air movement, no audible wheezing bilaterally, no use of accessory muscles.  Cardiac: RRR, no JVD Vascular:  Incisional scars well healed consistent with previous bypasses Vessel Right Left  Radial Palpable Palpable  PT Not Palpable Not Palpable  DP Not Palpable Not Palpable  Gastrointestinal: Non-distended. No guarding/no peritoneal signs.  Musculoskeletal: M/S 5/5 throughout.  No deformity or atrophy.  Neurologic: CN 2-12 intact. Symmetrical.  Speech is fluent. Motor exam as listed above. Psychiatric: Judgment intact, Mood & affect appropriate for pt's clinical situation. Dermatologic: No rashes or ulcers noted.  No changes consistent with cellulitis. Lymph : No lichenification or skin changes of chronic lymphedema.  CBC Lab Results   Component Value Date   WBC 8.9 01/19/2019   HGB 12.2 01/19/2019   HCT 37.1 01/19/2019   MCV 87.7 01/19/2019   PLT 289 01/19/2019    BMET    Component Value Date/Time   NA 138 01/19/2019 0357   NA 135 11/02/2014 0609   K 4.6 01/19/2019 0357   K 4.2 11/02/2014 0609   CL 109 01/19/2019 0357   CL 107 11/02/2014 0609   CO2 23 01/19/2019 0357   CO2 23 11/02/2014 0609   GLUCOSE 271 (H) 01/19/2019 0357   GLUCOSE 108 (H) 11/02/2014 0609   BUN 24 (H) 01/19/2019 0357   BUN 20 11/02/2014 0609   CREATININE 0.91 01/19/2019 0357   CREATININE 1.01 11/09/2015 1549   CREATININE 1.01 11/09/2015 1549   CALCIUM 8.4 (L) 01/19/2019 0357   CALCIUM 7.3 (L) 11/02/2014 0609   GFRNONAA >60 01/19/2019 0357   GFRNONAA 50 (L) 11/02/2014 0609   GFRAA >60 01/19/2019 0357   GFRAA 58 (L) 11/02/2014 0609   CrCl cannot be calculated (Patient's most recent lab result is older than the maximum 21 days allowed.).  COAG Lab Results  Component Value Date   INR 0.9 10/17/2014   INR 1.1 01/19/2014   INR 0.9 12/06/2013    Radiology No results found.   Assessment/Plan 1. Atherosclerosis of native artery of both lower extremities with intermittent claudication (HCC) Recommend:  The patient is status post successful angiogram with intervention. The patient reports that the claudication symptoms and leg pain is essentially gone. The patient denies lifestyle limiting changes at this point in time.  No further invasive studies, angiography or surgery at this time The patient should continue walking and begin a more formal exercise program.  The patient should continue antiplatelet therapy and aggressive treatment of the lipid abnormalities  Smoking cessation was again discussed  The patient should continue wearing graduated compression socks 10-15 mmHg strength to control the mild edema.  Patient should undergo noninvasive studies as ordered. The patient will follow up with me after the studies.  - VAS [place] LOWER EXTREMITY ARTERIAL DUPLEX; Future - VAS [place] ABI WITH/WO TBI; Future  2. Bilateral carotid artery stenosis Recommend:  Given the patient's asymptomatic subcritical stenosis no further invasive testing or surgery at this time.  Duplex ultrasound showsless than 50%stenosis bilaterally.  Continue antiplatelet therapy as prescribed Continue management of CAD, HTN and Hyperlipidemia Healthy heart diet, encouraged exercise at least 4 times per week  Follow up in6 months with  duplex ultrasound and physical exam  - VAS US CAROTID; Future  3. Coronary artery disease of native artery of native heart with stable angina pectoris (HCC) Continue cardiac and antihypertensive medications as already ordered and reviewed, no changes at this time.  Continue statin as ordered and reviewed, no changes at this time  Nitrates PRN for chest pain   4. Essential hypertension Continue antihypertensive medications as already ordered, these medications have been reviewed and there are no changes at this time.   5. Controlled type 2 DM with peripheral circulatory disorder (HCC) Continue hypoglycemic medications as already ordered, these medications have been reviewed and there are no changes at this time.  Hgb A1C to be monitored as already arranged by primary service   Hortencia Pilar, MD  04/18/2019 8:12 AM

## 2019-04-19 ENCOUNTER — Telehealth: Payer: Self-pay

## 2019-04-19 DIAGNOSIS — E1142 Type 2 diabetes mellitus with diabetic polyneuropathy: Secondary | ICD-10-CM

## 2019-04-19 DIAGNOSIS — E1140 Type 2 diabetes mellitus with diabetic neuropathy, unspecified: Secondary | ICD-10-CM | POA: Diagnosis not present

## 2019-04-19 DIAGNOSIS — E1151 Type 2 diabetes mellitus with diabetic peripheral angiopathy without gangrene: Secondary | ICD-10-CM

## 2019-04-19 DIAGNOSIS — Z794 Long term (current) use of insulin: Secondary | ICD-10-CM | POA: Diagnosis not present

## 2019-04-19 DIAGNOSIS — I739 Peripheral vascular disease, unspecified: Secondary | ICD-10-CM

## 2019-04-19 DIAGNOSIS — B351 Tinea unguium: Secondary | ICD-10-CM | POA: Diagnosis not present

## 2019-04-19 NOTE — Telephone Encounter (Signed)
Copied from [place] [id number]. Topic: General - Other >> Apr 19, 2019 10:16 AM Aundraya Stare wrote: Pt call to say she need a RX for diabetic shoes and letter stating chronic ulcers on feet , she said can be mailed to her address

## 2019-04-20 NOTE — Telephone Encounter (Signed)
DME for diabetic hoses  printed

## 2019-04-21 NOTE — Telephone Encounter (Signed)
Reprinted and placed in quick sign folder for signature.

## 2019-04-25 DIAGNOSIS — Z794 Long term (current) use of insulin: Secondary | ICD-10-CM | POA: Diagnosis not present

## 2019-04-25 DIAGNOSIS — E11621 Type 2 diabetes mellitus with foot ulcer: Secondary | ICD-10-CM | POA: Diagnosis not present

## 2019-04-25 DIAGNOSIS — Z792 Long term (current) use of antibiotics: Secondary | ICD-10-CM | POA: Diagnosis not present

## 2019-04-25 DIAGNOSIS — E1142 Type 2 diabetes mellitus with diabetic polyneuropathy: Secondary | ICD-10-CM | POA: Diagnosis not present

## 2019-04-25 DIAGNOSIS — M869 Osteomyelitis, unspecified: Secondary | ICD-10-CM | POA: Diagnosis not present

## 2019-04-25 DIAGNOSIS — I2510 Atherosclerotic heart disease of native coronary artery without angina pectoris: Secondary | ICD-10-CM | POA: Diagnosis not present

## 2019-04-25 DIAGNOSIS — Z87891 Personal history of nicotine dependence: Secondary | ICD-10-CM | POA: Diagnosis not present

## 2019-04-25 DIAGNOSIS — L97522 Non-pressure chronic ulcer of other part of left foot with fat layer exposed: Secondary | ICD-10-CM | POA: Diagnosis not present

## 2019-04-25 DIAGNOSIS — I110 Hypertensive heart disease with heart failure: Secondary | ICD-10-CM | POA: Diagnosis not present

## 2019-04-25 DIAGNOSIS — E1151 Type 2 diabetes mellitus with diabetic peripheral angiopathy without gangrene: Secondary | ICD-10-CM | POA: Diagnosis not present

## 2019-04-25 DIAGNOSIS — Z89421 Acquired absence of other right toe(s): Secondary | ICD-10-CM | POA: Diagnosis not present

## 2019-04-25 DIAGNOSIS — Z7902 Long term (current) use of antithrombotics/antiplatelets: Secondary | ICD-10-CM | POA: Diagnosis not present

## 2019-04-25 DIAGNOSIS — Z7982 Long term (current) use of aspirin: Secondary | ICD-10-CM | POA: Diagnosis not present

## 2019-04-25 DIAGNOSIS — Z85528 Personal history of other malignant neoplasm of kidney: Secondary | ICD-10-CM | POA: Diagnosis not present

## 2019-04-25 DIAGNOSIS — E1169 Type 2 diabetes mellitus with other specified complication: Secondary | ICD-10-CM | POA: Diagnosis not present

## 2019-04-25 DIAGNOSIS — E785 Hyperlipidemia, unspecified: Secondary | ICD-10-CM | POA: Diagnosis not present

## 2019-04-25 DIAGNOSIS — I5032 Chronic diastolic (congestive) heart failure: Secondary | ICD-10-CM | POA: Diagnosis not present

## 2019-04-25 DIAGNOSIS — Z89422 Acquired absence of other left toe(s): Secondary | ICD-10-CM | POA: Diagnosis not present

## 2019-04-25 DIAGNOSIS — G4700 Insomnia, unspecified: Secondary | ICD-10-CM | POA: Diagnosis not present

## 2019-04-25 NOTE — Telephone Encounter (Signed)
Mailed to patient

## 2019-05-02 DIAGNOSIS — E11621 Type 2 diabetes mellitus with foot ulcer: Secondary | ICD-10-CM | POA: Diagnosis not present

## 2019-05-02 DIAGNOSIS — E785 Hyperlipidemia, unspecified: Secondary | ICD-10-CM | POA: Diagnosis not present

## 2019-05-02 DIAGNOSIS — M869 Osteomyelitis, unspecified: Secondary | ICD-10-CM | POA: Diagnosis not present

## 2019-05-02 DIAGNOSIS — Z89422 Acquired absence of other left toe(s): Secondary | ICD-10-CM | POA: Diagnosis not present

## 2019-05-02 DIAGNOSIS — G4700 Insomnia, unspecified: Secondary | ICD-10-CM | POA: Diagnosis not present

## 2019-05-02 DIAGNOSIS — Z792 Long term (current) use of antibiotics: Secondary | ICD-10-CM | POA: Diagnosis not present

## 2019-05-02 DIAGNOSIS — I110 Hypertensive heart disease with heart failure: Secondary | ICD-10-CM | POA: Diagnosis not present

## 2019-05-02 DIAGNOSIS — Z7902 Long term (current) use of antithrombotics/antiplatelets: Secondary | ICD-10-CM | POA: Diagnosis not present

## 2019-05-02 DIAGNOSIS — L97522 Non-pressure chronic ulcer of other part of left foot with fat layer exposed: Secondary | ICD-10-CM | POA: Diagnosis not present

## 2019-05-02 DIAGNOSIS — E1169 Type 2 diabetes mellitus with other specified complication: Secondary | ICD-10-CM | POA: Diagnosis not present

## 2019-05-02 DIAGNOSIS — Z7982 Long term (current) use of aspirin: Secondary | ICD-10-CM | POA: Diagnosis not present

## 2019-05-02 DIAGNOSIS — E1142 Type 2 diabetes mellitus with diabetic polyneuropathy: Secondary | ICD-10-CM | POA: Diagnosis not present

## 2019-05-02 DIAGNOSIS — Z794 Long term (current) use of insulin: Secondary | ICD-10-CM | POA: Diagnosis not present

## 2019-05-02 DIAGNOSIS — Z87891 Personal history of nicotine dependence: Secondary | ICD-10-CM | POA: Diagnosis not present

## 2019-05-02 DIAGNOSIS — I5032 Chronic diastolic (congestive) heart failure: Secondary | ICD-10-CM | POA: Diagnosis not present

## 2019-05-02 DIAGNOSIS — E1151 Type 2 diabetes mellitus with diabetic peripheral angiopathy without gangrene: Secondary | ICD-10-CM | POA: Diagnosis not present

## 2019-05-02 DIAGNOSIS — Z85528 Personal history of other malignant neoplasm of kidney: Secondary | ICD-10-CM | POA: Diagnosis not present

## 2019-05-02 DIAGNOSIS — Z89421 Acquired absence of other right toe(s): Secondary | ICD-10-CM | POA: Diagnosis not present

## 2019-05-02 DIAGNOSIS — I2510 Atherosclerotic heart disease of native coronary artery without angina pectoris: Secondary | ICD-10-CM | POA: Diagnosis not present

## 2019-05-09 DIAGNOSIS — I5032 Chronic diastolic (congestive) heart failure: Secondary | ICD-10-CM | POA: Diagnosis not present

## 2019-05-09 DIAGNOSIS — E785 Hyperlipidemia, unspecified: Secondary | ICD-10-CM | POA: Diagnosis not present

## 2019-05-09 DIAGNOSIS — E1151 Type 2 diabetes mellitus with diabetic peripheral angiopathy without gangrene: Secondary | ICD-10-CM | POA: Diagnosis not present

## 2019-05-09 DIAGNOSIS — Z85528 Personal history of other malignant neoplasm of kidney: Secondary | ICD-10-CM | POA: Diagnosis not present

## 2019-05-09 DIAGNOSIS — Z7982 Long term (current) use of aspirin: Secondary | ICD-10-CM | POA: Diagnosis not present

## 2019-05-09 DIAGNOSIS — G4700 Insomnia, unspecified: Secondary | ICD-10-CM | POA: Diagnosis not present

## 2019-05-09 DIAGNOSIS — I110 Hypertensive heart disease with heart failure: Secondary | ICD-10-CM | POA: Diagnosis not present

## 2019-05-09 DIAGNOSIS — Z7902 Long term (current) use of antithrombotics/antiplatelets: Secondary | ICD-10-CM | POA: Diagnosis not present

## 2019-05-09 DIAGNOSIS — E1169 Type 2 diabetes mellitus with other specified complication: Secondary | ICD-10-CM | POA: Diagnosis not present

## 2019-05-09 DIAGNOSIS — Z89421 Acquired absence of other right toe(s): Secondary | ICD-10-CM | POA: Diagnosis not present

## 2019-05-09 DIAGNOSIS — Z792 Long term (current) use of antibiotics: Secondary | ICD-10-CM | POA: Diagnosis not present

## 2019-05-09 DIAGNOSIS — Z89422 Acquired absence of other left toe(s): Secondary | ICD-10-CM | POA: Diagnosis not present

## 2019-05-09 DIAGNOSIS — M869 Osteomyelitis, unspecified: Secondary | ICD-10-CM | POA: Diagnosis not present

## 2019-05-09 DIAGNOSIS — Z794 Long term (current) use of insulin: Secondary | ICD-10-CM | POA: Diagnosis not present

## 2019-05-09 DIAGNOSIS — Z87891 Personal history of nicotine dependence: Secondary | ICD-10-CM | POA: Diagnosis not present

## 2019-05-09 DIAGNOSIS — E1142 Type 2 diabetes mellitus with diabetic polyneuropathy: Secondary | ICD-10-CM | POA: Diagnosis not present

## 2019-05-09 DIAGNOSIS — I2510 Atherosclerotic heart disease of native coronary artery without angina pectoris: Secondary | ICD-10-CM | POA: Diagnosis not present

## 2019-05-09 DIAGNOSIS — L97522 Non-pressure chronic ulcer of other part of left foot with fat layer exposed: Secondary | ICD-10-CM | POA: Diagnosis not present

## 2019-05-09 DIAGNOSIS — E11621 Type 2 diabetes mellitus with foot ulcer: Secondary | ICD-10-CM | POA: Diagnosis not present

## 2019-05-10 ENCOUNTER — Other Ambulatory Visit: Payer: Self-pay

## 2019-05-10 MED ORDER — INSULIN LISPRO PROT & LISPRO (75-25 MIX) 100 UNIT/ML KWIKPEN: PEN_INJECTOR | SUBCUTANEOUS | 2 refills | Status: DC

## 2019-05-18 ENCOUNTER — Other Ambulatory Visit: Payer: Self-pay | Admitting: *Deleted

## 2019-05-18 MED ORDER — ALPRAZOLAM 0.5 MG PO TABS: 0.5000 mg | ORAL_TABLET | Freq: Every day | ORAL | 5 refills | Status: DC | PRN

## 2019-05-18 NOTE — Telephone Encounter (Signed)
Okay to refill?  LOV: 01/26/19 NOV: 05/26/19

## 2019-05-19 ENCOUNTER — Telehealth: Payer: Self-pay | Admitting: Internal Medicine

## 2019-05-19 NOTE — Telephone Encounter (Signed)
Received form from [facility] for diabetic inserts and shoes called [facility] and advised patient sees podiatry this would be best handled by them. Proved Podiatry MD name Dr. Cleda Mccreedy and fx and phone number to [facility].

## 2019-05-24 ENCOUNTER — Other Ambulatory Visit: Payer: Self-pay

## 2019-05-26 ENCOUNTER — Ambulatory Visit (INDEPENDENT_AMBULATORY_CARE_PROVIDER_SITE_OTHER): Payer: Medicare Other | Admitting: Internal Medicine

## 2019-05-26 ENCOUNTER — Ambulatory Visit (INDEPENDENT_AMBULATORY_CARE_PROVIDER_SITE_OTHER): Payer: Medicare Other

## 2019-05-26 ENCOUNTER — Other Ambulatory Visit: Payer: Self-pay

## 2019-05-26 ENCOUNTER — Encounter: Payer: Self-pay | Admitting: Internal Medicine

## 2019-05-26 DIAGNOSIS — Z0000 Encounter for general adult medical examination without abnormal findings: Secondary | ICD-10-CM

## 2019-05-26 DIAGNOSIS — E1151 Type 2 diabetes mellitus with diabetic peripheral angiopathy without gangrene: Secondary | ICD-10-CM

## 2019-05-26 DIAGNOSIS — I10 Essential (primary) hypertension: Secondary | ICD-10-CM

## 2019-05-26 LAB — LIPID PANEL
Cholesterol: 111 mg/dL (ref 0–200)
HDL: 49.9 mg/dL (ref >39.00)
LDL Cholesterol: 48 mg/dL (ref 0–99)
NonHDL: 60.92
Total CHOL/HDL Ratio: 2
Triglycerides: 64 mg/dL (ref 0.0–149.0)
VLDL: 12.8 mg/dL (ref 0.0–40.0)

## 2019-05-26 LAB — HEMOGLOBIN A1C: Hgb A1c MFr Bld: 7.7 % — ABNORMAL HIGH (ref 4.6–6.5)

## 2019-05-26 LAB — COMPREHENSIVE METABOLIC PANEL
ALT: 18 U/L (ref 0–35)
AST: 15 U/L (ref 0–37)
Albumin: 4.2 g/dL (ref 3.5–5.2)
Alkaline Phosphatase: 107 U/L (ref 39–117)
BUN: 22 mg/dL (ref 6–23)
CO2: 28 mEq/L (ref 19–32)
Calcium: 9.2 mg/dL (ref 8.4–10.5)
Chloride: 104 mEq/L (ref 96–112)
Creatinine, Ser: 0.93 mg/dL (ref 0.40–1.20)
GFR: 61.35 mL/min (ref >60.00)
Glucose, Bld: 189 mg/dL — ABNORMAL HIGH (ref 70–99)
Potassium: 4.7 mEq/L (ref 3.5–5.1)
Sodium: 139 mEq/L (ref 135–145)
Total Bilirubin: 0.5 mg/dL (ref 0.2–1.2)
Total Protein: 7 g/dL (ref 6.0–8.3)

## 2019-05-26 LAB — MICROALBUMIN / CREATININE URINE RATIO
Creatinine,U: 8.4 mg/dL
Microalb Creat Ratio: 213.8 mg/g — ABNORMAL HIGH (ref 0.0–30.0)
Microalb, Ur: 18 mg/dL — ABNORMAL HIGH (ref 0.0–1.9)

## 2019-05-26 MED ORDER — ZOSTER VAC RECOMB ADJUVANTED 50 MCG/0.5ML IM SUSR: 0.5000 mL | Freq: Once | INTRAMUSCULAR | 1 refills | Status: AC

## 2019-05-26 NOTE — Patient Instructions (Signed)
  Laura Reed , Thank you for taking time to come for your Medicare Wellness Visit. I appreciate your ongoing commitment to your health goals. Please review the following plan we discussed and let me know if I can assist you in the future.   These are the goals we discussed: Goals    . DIET - REDUCE SUGAR INTAKE     A1C within range       This is a list of the screening recommended for you and due dates:  Health Maintenance  Topic Date Due  . Eye exam for diabetics  11/20/2016  . Hemoglobin A1C  06/05/2019  . Complete foot exam   04/20/2020  . Mammogram  12/28/2020  . Pap Smear  12/05/2021  . Tetanus Vaccine  03/16/2022  . Colon Cancer Screening  04/27/2022  . Flu Shot  Completed  . Pneumococcal vaccine  Completed  .  Hepatitis C: One time screening is recommended by Center for Disease Control  (CDC) for  adults born from [birth year] through 1967.   Completed  . HIV Screening  Completed

## 2019-05-26 NOTE — Progress Notes (Signed)
Subjective:  Patient ID: Laura Reed, female    DOB: [birth year]  Age: 58 y.o. MRN: [id number]  CC: Diagnoses of Controlled type 2 DM with peripheral circulatory disorder ([place]) and Essential hypertension were pertinent to this visit.  HPI Laura Reed presents for follow up on T2DM ,  Hypertension   AWV DONE TODAY     He feels generally well, is exercising several times per week and checking blood sugars once daily at variable times.  BS have been under 130 fasting and < 150 post prandially.  Denies any recent hypoglyemic events.  Taking his medications as directed. Following a carbohydrate modified diet 6 days per week. Denies numbness, burning and tingling of extremities. Appetite is good.   CBGS REVIEWED:  163 , 184,   207,  140   139,  154,  113  AND 162  ALL FASTING  POST PRANDIAL BFAST : 129,  156,   142  Taking  levemir 30 units daily.  Mixed insulin twice daily  30  Units of  75/25 in am  And 36 units in the pm  Dietary indiscretions listed, lots of starches but less refined sugar   Hypertension: patient checks blood pressure twice weekly at home.  Readings have been for the most part > 140/80 at rest . Patient is following a reduce salt diet most days and is taking medications as prescribedome readings have been 120/70 at home   Cc:  Rhinitis and recurrent sneezing NO ANTHISTAMINE  Wearing regular shoes  All wounds healed      Lab Results  Component Value Date   HGBA1C 7.7 (H) 05/26/2019       Outpatient Medications Prior to Visit  Medication Sig Dispense Refill  . acetaminophen (TYLENOL) 500 MG tablet Take 1,000 mg by mouth every 6 (six) hours as needed for mild pain or headache.     . ALPRAZolam (XANAX) 0.5 MG tablet Take 1 tablet (0.5 mg total) by mouth daily as needed. for anxiety 30 tablet 5  . amLODipine (NORVASC) 5 MG tablet TAKE 1 TABLET(5 MG) BY MOUTH DAILY 90 tablet 1  . aspirin 81 MG chewable tablet Chew 81 mg by mouth at bedtime.     Marland Kitchen atorvastatin  (LIPITOR) 20 MG tablet Take 1 tablet (20 mg total) by mouth daily. (Patient taking differently: Take 20 mg by mouth at bedtime. ) 90 tablet 1  . Cholecalciferol (VITAMIN D3) 2000 UNITS TABS Take 2,000 Units by mouth daily after supper.     . clopidogrel (PLAVIX) 75 MG tablet Take 1 tablet (75 mg total) by mouth daily. 90 tablet 1  . digoxin (LANOXIN) 0.125 MG tablet Take 1 tablet (125 mcg total) by mouth daily. 90 tablet 1  . gabapentin (NEURONTIN) 600 MG tablet Take 1 tablet (600 mg total) by mouth 3 (three) times daily. 270 tablet 1  . glucose blood (ACCU-CHEK AVIVA PLUS) test strip Test blood sugar 2-3 times/ day. 300 each 1  . Insulin Lispro Prot & Lispro (HUMALOG MIX 75/25 KWIKPEN) (75-25) 100 UNIT/ML Kwikpen INJECT 30 UNITS UNDER THE SKIN EVERY MORNING AND 36 UNITS EVERY EVENING 30 mL 2  . Insulin Pen Needle (B-D ULTRAFINE III SHORT PEN) 31G X 8 MM MISC TEST BLOOD SUGAR TWICE DAILY 100 each 2  . Lancets Misc. MISC Use to check blood sugars 3 times daily 100 each 11  . LEVEMIR FLEXTOUCH 100 UNIT/ML Pen INJECT 40 UNITS UNDER THE SKIN EVERY DAY AT 10 PM (Patient taking differently:  Inject 30 Units into the skin at bedtime. ) 15 mL 2  . losartan (COZAAR) 50 MG tablet Take 1 tablet (50 mg total) by mouth at bedtime. (Patient not taking: Reported on 05/26/2019) 90 tablet 3   No facility-administered medications prior to visit.     Review of Systems;  Patient denies headache, fevers, malaise, unintentional weight loss, skin rash, eye pain, sinus congestion and sinus pain, sore throat, dysphagia,  hemoptysis , cough, dyspnea, wheezing, chest pain, palpitations, orthopnea, edema, abdominal pain, nausea, melena, diarrhea, constipation, flank pain, dysuria, hematuria, urinary  Frequency, nocturia, numbness, tingling, seizures,  Focal weakness, Loss of consciousness,  Tremor, insomnia, depression, anxiety, and suicidal ideation.      Objective:  BP (!) 160/74 (BP Location: Left Arm, Patient Position:  Sitting, Cuff Size: Normal)   Pulse 81   Temp (!) 97 F (36.1 C) (Temporal)   Resp 16   Ht 5\' 7"  (1.702 m)   Wt 173 lb 9.6 oz (78.7 kg)   SpO2 99%   BMI 27.19 kg/m   BP Readings from Last 3 Encounters:  05/26/19 (!) 160/74  04/18/19 (!) 151/69  01/19/19 (!) 153/55    Wt Readings from Last 3 Encounters:  05/26/19 173 lb 9.6 oz (78.7 kg)  04/18/19 171 lb (77.6 kg)  01/18/19 167 lb 1.7 oz (75.8 kg)    General appearance: alert, cooperative and appears stated age Ears: normal TM's and external ear canals both ears Throat: lips, mucosa, and tongue normal; teeth and gums normal Neck: no adenopathy, no carotid bruit, supple, symmetrical, trachea midline and thyroid not enlarged, symmetric, no tenderness/mass/nodules Back: symmetric, no curvature. ROM normal. No CVA tenderness. Lungs: clear to auscultation bilaterally Heart: regular rate and rhythm, S1, S2 normal, no murmur, click, rub or gallop Abdomen: soft, non-tender; bowel sounds normal; no masses,  no organomegaly Pulses: 2+ and symmetric Skin: Skin color, texture, turgor normal. No rashes or lesions Lymph nodes: Cervical, supraclavicular, and axillary nodes normal.  Lab Results  Component Value Date   HGBA1C 7.7 (H) 05/26/2019   HGBA1C 7.4 (H) 12/03/2018   HGBA1C 8.7 (H) 03/25/2018    Lab Results  Component Value Date   CREATININE 0.93 05/26/2019   CREATININE 0.91 01/19/2019   CREATININE 0.94 01/18/2019    Lab Results  Component Value Date   WBC 8.9 01/19/2019   HGB 12.2 01/19/2019   HCT 37.1 01/19/2019   PLT 289 01/19/2019   GLUCOSE 189 (H) 05/26/2019   CHOL 111 05/26/2019   TRIG 64.0 05/26/2019   HDL 49.90 05/26/2019   LDLDIRECT 47.0 01/28/2017   LDLCALC 48 05/26/2019   ALT 18 05/26/2019   AST 15 05/26/2019   NA 139 05/26/2019   K 4.7 05/26/2019   CL 104 05/26/2019   CREATININE 0.93 05/26/2019   BUN 22 05/26/2019   CO2 28 05/26/2019   TSH 0.96 05/18/2015   INR 0.9 10/17/2014   HGBA1C 7.7 (H)  05/26/2019   MICROALBUR 18.0 (H) 05/26/2019    No results found.  Assessment & Plan:   Problem List Items Addressed This Visit      Unprioritized   Controlled type 2 DM with peripheral circulatory disorder (HCC)    Taking  levemir 30 units daily.  Mixed insulin twice daily  30  Units of  75/25 in am  And 36 units in the pm   fastings have been < 160 most of the time and post prandials < 160 as well.  Given her current A1c 7.7 ,  will increase evening dose by 3 units    Lab Results  Component Value Date   HGBA1C 7.7 (H) 05/26/2019         Relevant Orders   Hemoglobin A1c (Completed)   Comprehensive metabolic panel (Completed)   Microalbumin / creatinine urine ratio (Completed)   Lipid panel (Completed)   Hypertension    Well controlled on current regimen. Renal function stable, no changes today.         I have discontinued Burnis Medin. Dueitt's losartan. I am also having her start on Zoster Vaccine Adjuvanted. Additionally, I am having her maintain her Vitamin D3, aspirin, acetaminophen, Lancets Misc., Levemir FlexTouch, Insulin Pen Needle, atorvastatin, clopidogrel, digoxin, amLODipine, gabapentin, Accu-Chek Aviva Plus, Insulin Lispro Prot & Lispro, and ALPRAZolam.  Meds ordered this encounter  Medications  . Zoster Vaccine Adjuvanted [facility]) injection    Sig: Inject 0.5 mLs into the muscle once for 1 dose.    Dispense:  1 each    Refill:  1    Medications Discontinued During This Encounter  Medication Reason  . losartan (COZAAR) 50 MG tablet Patient has not taken in last 30 days    Follow-up: Return in about 3 months (around 08/26/2019) for follow up diabetes.   [name], MD

## 2019-05-26 NOTE — Patient Instructions (Addendum)
  The new goals for optimal blood pressure management are 120/70.  Please check your blood pressure a few times at home and send me the readings so I can determine if you need a change in medication   For your allergies ,  You  Should consider adding one of the  newer second generation antihistamines that are longer acting, non sedating and  available OTC at [facility] ::  Generic  Zyrtec, which is cetirizine.    generic Allegra , available generically as fexofenadine ; comes in 60 mg and 180 mg once daily strengths.    Generic Claritin :  also available as loratidine .       The ShingRx vaccine is now available in local pharmacies and is much more protective than the old one  Zostavax  (it is about 97%  Effective in preventing shingles). .   It is therefore ADVISED for all interested adults over 50 to prevent shingles so I have printed you a prescription for it.  (it requires a 2nd dose 2 to 6 months after the first one) .  It will cause you to have flu  like symptoms for 2 days If your pharmacy is not available to give it to you,  The [facility] pharmacy will give it to you.  They are located on the ground floor of the [employer]

## 2019-05-26 NOTE — Assessment & Plan Note (Addendum)
Taking  levemir 30 units daily.  Mixed insulin twice daily  30  Units of  75/25 in am  And 36 units in the pm   fastings have been < 160 most of the time and post prandials < 160 as well.  Given her current A1c 7.7 , will increase evening dose by 3 units    Lab Results  Component Value Date   HGBA1C 7.7 (H) 05/26/2019

## 2019-05-26 NOTE — Progress Notes (Addendum)
Subjective:   Laura Reed is a 58 y.o. female who presents for Medicare Annual (Subsequent) preventive examination.  Review of Systems:  No ROS.  Medicare Wellness Virtual Visit.  Visual/audio telehealth visit, UTA vital signs.   See social history for additional risk factors.   Cardiac Risk Factors include: advanced age (>43men, >47 women);hypertension;diabetes mellitus     Objective:     Vitals: There were no vitals taken for this visit.  There is no height or weight on file to calculate BMI.  Advanced Directives 05/26/2019 01/18/2019 01/18/2019 07/16/2018 07/12/2018 05/21/2018 05/05/2018  Does Patient Have a Medical Advance Directive? Yes No;Yes No No;Yes Yes Yes Yes  Type of Paramedic of [place];Living will Healthcare Power of [place];Living will [place];Living will [place];Living will [place];Living will  Does patient want to make changes to medical advance directive? No - Patient declined No - Patient declined - No - Patient declined No - Patient declined No - Patient declined No - Patient declined  Copy of [place] in Chart? No - copy requested No - copy requested - No - copy requested No - copy requested No - copy requested No - copy requested  Would patient like information on creating a medical advance directive? - No - Patient declined No - Patient declined Yes (Inpatient - patient requests chaplain consult to create a medical advance directive) - - -    Tobacco Social History   Tobacco Use  Smoking Status Former Smoker   Packs/day: 2.00   Years: 35.00   Pack years: 70.00   Types: Cigarettes   Quit date: 03/30/2011   Years since quitting: 8.1  Smokeless Tobacco Never Used     Counseling given: Not Answered   Clinical Intake:  Pre-visit preparation completed: Yes        Diabetes: Yes(Followed by Dr.  Cleda Mccreedy)  How often do you need to have someone help you when you read instructions, pamphlets, or other written materials from your doctor or pharmacy?: 1 - Never  Interpreter Needed?: No     Past Medical History:  Diagnosis Date   Anxiety    Bladder cancer ([place])    CHF (congestive heart failure) ([place])    Coronary artery disease    Diabetes mellitus    Heart murmur    Hemorrhoid    Hypertension    Neuropathy    PVD (peripheral vascular disease) ([place])    Thyroid nodule    right   Urothelial carcinoma of kidney ([place]) 10/31/2014   INVASIVE UROTHELIAL CARCINOMA, LOW GRADE. T1, Nx.   Past Surgical History:  Procedure Laterality Date   AMPUTATION TOE     right (4th and 5th); left (great toe, 3rd)   AMPUTATION TOE Right 07/16/2018   Procedure: AMPUTATION TOE/MPJ right 2nd;  Surgeon: Sharlotte Alamo, DPM;  Location: [facility];  Service: Podiatry;  Laterality: Right;   ARTERIAL BYPASS SURGRY  2009, 2013 x 2   right leg , done in [place] Right 01/2014   Dr Delana Meyer   CATARACT EXTRACTION W/PHACO Right 12/14/2014   Procedure: CATARACT EXTRACTION PHACO AND INTRAOCULAR LENS PLACEMENT ([place]);  Surgeon: Lyla Glassing, MD;  Location: [facility];  Service: Ophthalmology;  Laterality: Right;  [place]   00:38.6              AP  7.1                   CDE  2.76   CESAREAN SECTION     CHOLECYSTECTOMY  03-03-12   Porcelain gallbladder, gallstones,  [place]   COLONOSCOPY W/ BIOPSIES  04/28/2012   Hyperplastic rectal polyps.   CORONARY ARTERY BYPASS GRAFT  2009   3 vessel   CYSTOSCOPY W/ RETROGRADES Right 09/01/2016   Procedure: CYSTOSCOPY WITH RETROGRADE PYELOGRAM;  Surgeon: Hollice Espy, MD;  Location: [facility];  Service: Urology;  Laterality: Right;   EYE SURGERY     HERNIA REPAIR  10-31-14   ventral, retro-rectus atrium mesh   IRRIGATION AND DEBRIDEMENT FOOT Left 01/18/2019   Procedure: IRRIGATION AND DEBRIDEMENT FOOT;   Surgeon: Sharlotte Alamo, DPM;  Location: [facility];  Service: Podiatry;  Laterality: Left;   LOWER EXTREMITY ANGIOGRAPHY Left 12/10/2016   Procedure: Lower Extremity Angiography;  Surgeon: Katha Cabal, MD;  Location: [place] [facility];  Service: Cardiovascular;  Laterality: Left;   LOWER EXTREMITY ANGIOGRAPHY Left 02/02/2018   Procedure: LOWER EXTREMITY ANGIOGRAPHY;  Surgeon: Katha Cabal, MD;  Location: [place] [facility];  Service: Cardiovascular;  Laterality: Left;   LOWER EXTREMITY ANGIOGRAPHY Left 05/05/2018   Procedure: LOWER EXTREMITY ANGIOGRAPHY;  Surgeon: Katha Cabal, MD;  Location: [place] [facility];  Service: Cardiovascular;  Laterality: Left;   NEPHRECTOMY Left 10-31-14   PERIPHERAL VASCULAR CATHETERIZATION Left 05/01/2015   Procedure: Lower Extremity Angiography;  Surgeon: Katha Cabal, MD;  Location: [place] [facility];  Service: Cardiovascular;  Laterality: Left;   PERIPHERAL VASCULAR CATHETERIZATION  05/01/2015   Procedure: Lower Extremity Intervention;  Surgeon: Katha Cabal, MD;  Location: [place] [facility];  Service: Cardiovascular;;   PERIPHERAL VASCULAR CATHETERIZATION Left 02/20/2015   Procedure: Pelvic Angiography;  Surgeon: Katha Cabal, MD;  Location: [place] [facility];  Service: Cardiovascular;  Laterality: Left;   TRANSURETHRAL RESECTION OF BLADDER TUMOR WITH MITOMYCIN-C N/A 09/01/2016   Procedure: TRANSURETHRAL RESECTION OF BLADDER TUMOR WITH MITOMYCIN-C;  Surgeon: Hollice Espy, MD;  Location: [facility];  Service: Urology;  Laterality: N/A;   Family History  Problem Relation Age of Onset   Cancer Mother 45       Lung Cancer   Cancer Father 64       Lung Ca   Diabetes Son    Breast cancer Maternal Grandmother    Kidney cancer Neg Hx    Bladder Cancer Neg Hx    Prostate cancer Neg Hx    Social History   Socioeconomic History   Marital status: Single    Spouse name: Not on file   Number of children:  Not on file   Years of education: Not on file   Highest education level: Not on file  Occupational History   Not on file  Social Needs   Financial resource strain: Not hard at all   Food insecurity    Worry: Never true    Inability: Never true   Transportation needs    Medical: No    Non-medical: No  Tobacco Use   Smoking status: Former Smoker    Packs/day: 2.00    Years: 35.00    Pack years: 70.00    Types: Cigarettes    Quit date: 03/30/2011    Years since quitting: 8.1   Smokeless tobacco: Never Used  Substance and Sexual Activity   Alcohol use: No    Alcohol/week: 0.0 standard drinks   Drug use:  Not Currently    Types: Cocaine    Comment: last used in 2007   Sexual activity: Not Currently  Lifestyle   Physical activity    Days per week: 0 days    Minutes per session: Not on file   Stress: Not at all  Relationships   Social connections    Talks on phone: More than three times a week    Gets together: More than three times a week    Attends religious service: Not on file    Active member of club or organization: Not on file    Attends meetings of clubs or organizations: Not on file    Relationship status: Not on file  Other Topics Concern   Not on file  Social History Narrative   Not on file    Outpatient Encounter Medications as of 05/26/2019  Medication Sig   acetaminophen (TYLENOL) 500 MG tablet Take 1,000 mg by mouth every 6 (six) hours as needed for mild pain or headache.    ALPRAZolam (XANAX) 0.5 MG tablet Take 1 tablet (0.5 mg total) by mouth daily as needed. for anxiety   amLODipine (NORVASC) 5 MG tablet TAKE 1 TABLET(5 MG) BY MOUTH DAILY   aspirin 81 MG chewable tablet Chew 81 mg by mouth at bedtime.    atorvastatin (LIPITOR) 20 MG tablet Take 1 tablet (20 mg total) by mouth daily. (Patient taking differently: Take 20 mg by mouth at bedtime. )   Cholecalciferol (VITAMIN D3) 2000 UNITS TABS Take 2,000 Units by mouth daily after  supper.    clopidogrel (PLAVIX) 75 MG tablet Take 1 tablet (75 mg total) by mouth daily.   digoxin (LANOXIN) 0.125 MG tablet Take 1 tablet (125 mcg total) by mouth daily.   gabapentin (NEURONTIN) 600 MG tablet Take 1 tablet (600 mg total) by mouth 3 (three) times daily.   glucose blood (ACCU-CHEK AVIVA PLUS) test strip Test blood sugar 2-3 times/ day.   Insulin Lispro Prot & Lispro (HUMALOG MIX 75/25 KWIKPEN) (75-25) 100 UNIT/ML Kwikpen INJECT 30 UNITS UNDER THE SKIN EVERY MORNING AND 36 UNITS EVERY EVENING   Insulin Pen Needle (B-D ULTRAFINE III SHORT PEN) 31G X 8 MM MISC TEST BLOOD SUGAR TWICE DAILY   Lancets Misc. MISC Use to check blood sugars 3 times daily   LEVEMIR FLEXTOUCH 100 UNIT/ML Pen INJECT 40 UNITS UNDER THE SKIN EVERY DAY AT 10 PM (Patient taking differently: Inject 30 Units into the skin at bedtime. )   losartan (COZAAR) 50 MG tablet Take 1 tablet (50 mg total) by mouth at bedtime.   [DISCONTINUED] neomycin-bacitracin-polymyxin (NEOSPORIN) ointment Apply 1 application topically as needed for wound care.   No facility-administered encounter medications on file as of 05/26/2019.     Activities of Daily Living In your present state of health, do you have any difficulty performing the following activities: 05/26/2019 01/18/2019  Hearing? N N  Vision? N N  Difficulty concentrating or making decisions? N N  Walking or climbing stairs? N Y  Dressing or bathing? N N  Doing errands, shopping? N N  Preparing Food and eating ? N -  Using the Toilet? N -  In the past six months, have you accidently leaked urine? N -  Do you have problems with loss of bowel control? N -  Managing your Medications? N -  Managing your Finances? N -  Housekeeping or managing your Housekeeping? N -  Some recent data might be hidden    Patient Care Team:  [name], MD as PCP - General (Internal Medicine) Bary Castilla Forest Gleason, MD (General Surgery) [name], MD (Internal  Medicine)    Assessment:   This is a routine wellness examination for [place].  Nurse connected with patient 05/26/19 at  9:30 AM EST by a telephone enabled telemedicine application and verified that I am speaking with the correct person using two identifiers. Patient stated full name and DOB. Patient gave permission to continue with virtual visit. Patient's location was at home and Nurse's location was at [place] office.   Health Maintenance Due: -Eye Exam- she plans to schedule -Hgb A1c- 12/03/18 (7.4) Update all pending maintenance due as appropriate.   See completed HM at the end of note.   Eye: Visual acuity not assessed. Virtual visit. Wears corrective lenses. Followed by their ophthalmologist. Retinopathy- none reported  Dental: Dentures- yes  Hearing: Demonstrates normal hearing during visit.  Safety:  Patient feels safe at home- yes Patient does have smoke detectors at home- yes Patient does wear sunscreen or protective clothing when in direct sunlight - yes Patient does wear seat belt when in a moving vehicle - yes Patient drives- yes Adequate lighting in walkways free from debris- yes Grab bars and handrails used as appropriate- yes Ambulates with no assistive device Cell phone on person when ambulating outside of the home- yes  Social: Alcohol intake - no     Smoking history- former   Smokers in home? none Illicit drug use? none  Depression: PHQ 2 &9 complete. See screening below. Denies irritability, anhedonia, sadness/tearfullness.    Falls: See screening below.    Medication: Taking as directed and without issues.   Covid-19: Precautions and sickness symptoms discussed. Wears mask, social distancing, hand hygiene as appropriate.   Activities of Daily Living Patient denies needing assistance with: household chores, feeding themselves, getting from bed to chair, getting to the toilet, bathing/showering, dressing, managing money, or preparing meals.    Memory: Patient is alert. Patient denies difficulty focusing or concentrating. Correctly identified the president of the [place], season and recall. Patient likes to read and watches tv for brain stimulation.   BMI- discussed the importance of a healthy diet, water intake and the benefits of aerobic exercise.  Educational material provided.  Physical activity- walking as tolerated  Diet:  Regular Water: poor intake Caffeine: 6 cups of coffee  Other Providers Patient Care Team: [name], MD as PCP - General (Internal Medicine) Bary Castilla, Forest Gleason, MD (General Surgery) [name], MD (Internal Medicine)  Exercise Activities and Dietary recommendations Current Exercise Habits: Home exercise routine, Type of exercise: walking, Intensity: Mild  Goals     DIET - REDUCE SUGAR INTAKE     A1C within range       Fall Risk Fall Risk  05/26/2019 05/21/2018 03/27/2017 04/09/2016 11/14/2015  Falls in the past year? 0 1 No No Yes  Number falls in past yr: 0 0 - - 1  Comment - She missed a step. - - -  Injury with Fall? - 0 - - No  Comment - Fall 3 months ago. - - -  Risk for fall due to : - - - - Other (Comment)  Risk for fall due to: Comment - - - - toe amputations x 4  Follow up Falls prevention discussed - - - Education provided;Falls prevention discussed   Timed Get Up and Go performed: no, virtual visit  Depression Screen [facility] 2/9 Scores 05/26/2019 05/21/2018 03/27/2017 04/09/2016  PHQ -  2 Score 0 0 0 0  PHQ- 9 Score - - 0 -     Cognitive Function MMSE - Mini Mental State Exam 05/21/2018 03/27/2017  Orientation to time 5 5  Orientation to Place 5 5  Registration 3 3  Attention/ Calculation 5 5  Recall 3 3  Language- name 2 objects 2 2  Language- repeat 1 1  Language- follow 3 step command 3 3  Language- read & follow direction 1 1  Write a sentence 1 1  Copy design 1 1  Total score 30 30     6CIT Screen 05/26/2019  What Year? 0 points  What month? 0  points  What time? 0 points  Count back from 20 0 points  Months in reverse 0 points    Immunization History  Administered Date(s) Administered   Influenza Split 03/16/2012   Influenza,inj,Quad PF,6+ Mos 04/06/2013, 03/14/2014, 03/27/2017, 03/25/2018, 05/11/2019   Influenza-Unspecified 04/02/2015, 03/14/2016   Pneumococcal Conjugate-13 06/15/2014   Pneumococcal Polysaccharide-23 07/07/2010   Tdap 03/16/2012   Screening Tests Health Maintenance  Topic Date Due   OPHTHALMOLOGY EXAM  11/20/2016   HEMOGLOBIN A1C  06/05/2019   FOOT EXAM  04/20/2020   MAMMOGRAM  12/28/2020   PAP SMEAR-Modifier  12/05/2021   TETANUS/TDAP  03/16/2022   COLONOSCOPY  04/27/2022   INFLUENZA VACCINE  Completed   PNEUMOCOCCAL POLYSACCHARIDE VACCINE AGE 60-64 HIGH RISK  Completed   Hepatitis C Screening  Completed   HIV Screening  Completed      Plan:   Keep all routine maintenance appointments.   Follow up with your doctor @ 10:00  Medicare Attestation I have personally reviewed: The patient's medical and social history Their use of alcohol, tobacco or illicit drugs Their current medications and supplements The patient's functional ability including ADLs,fall risks, home safety risks, cognitive, and hearing and visual impairment Diet and physical activities Evidence for depression   In addition, I have reviewed and discussed with patient certain preventive protocols, quality metrics, and best practice recommendations. A written personalized care plan for preventive services as well as general preventive health recommendations were provided to patient.     OBrien-Blaney, Halston Kintz L, LPN  [id number]     I have reviewed the above information and agree with above.   Deborra Medina, MD

## 2019-05-28 NOTE — Assessment & Plan Note (Signed)
Well controlled on current regimen. Renal function stable, no changes today. 

## 2019-05-31 ENCOUNTER — Ambulatory Visit: Payer: Medicare Other | Admitting: Urology

## 2019-06-08 ENCOUNTER — Telehealth: Payer: Self-pay | Admitting: *Deleted

## 2019-06-08 NOTE — Telephone Encounter (Signed)
Spoke with [facility] to let them know that we did receive the fax and that we will get it faxed back to them tomorrow when Dr. Derrel Nip is back in the office.

## 2019-06-08 NOTE — Telephone Encounter (Signed)
Copied from [place] [id number]. Topic: General - Other >> Jun 08, 2019 11:14 AM Antonieta Iba C wrote: Reason for CRM: [facility] is calling in to confirm receipt of fax for supplies for pt?   [phone] - [place]

## 2019-07-05 DIAGNOSIS — Z89422 Acquired absence of other left toe(s): Secondary | ICD-10-CM | POA: Diagnosis not present

## 2019-07-05 DIAGNOSIS — E1140 Type 2 diabetes mellitus with diabetic neuropathy, unspecified: Secondary | ICD-10-CM | POA: Diagnosis not present

## 2019-07-05 DIAGNOSIS — Z89421 Acquired absence of other right toe(s): Secondary | ICD-10-CM | POA: Diagnosis not present

## 2019-07-19 DIAGNOSIS — L720 Epidermal cyst: Secondary | ICD-10-CM | POA: Diagnosis not present

## 2019-07-21 ENCOUNTER — Other Ambulatory Visit: Payer: Self-pay

## 2019-07-21 MED ORDER — CLOPIDOGREL BISULFATE 75 MG PO TABS: 75.0000 mg | ORAL_TABLET | Freq: Every day | ORAL | 1 refills | Status: DC

## 2019-07-21 MED ORDER — ATORVASTATIN CALCIUM 20 MG PO TABS: 20.0000 mg | ORAL_TABLET | Freq: Every day | ORAL | 1 refills | Status: DC

## 2019-07-21 MED ORDER — DIGOXIN 125 MCG PO TABS: 125.0000 ug | ORAL_TABLET | Freq: Every day | ORAL | 1 refills | Status: DC

## 2019-08-04 DIAGNOSIS — L720 Epidermal cyst: Secondary | ICD-10-CM | POA: Diagnosis not present

## 2019-08-09 ENCOUNTER — Ambulatory Visit (INDEPENDENT_AMBULATORY_CARE_PROVIDER_SITE_OTHER): Payer: Medicare Other | Admitting: Urology

## 2019-08-09 ENCOUNTER — Encounter: Payer: Self-pay | Admitting: Urology

## 2019-08-09 ENCOUNTER — Other Ambulatory Visit: Payer: Self-pay

## 2019-08-09 VITALS — BP 159/65 | HR 79 | Ht 67.0 in | Wt 163.0 lb

## 2019-08-09 DIAGNOSIS — C679 Malignant neoplasm of bladder, unspecified: Secondary | ICD-10-CM

## 2019-08-09 NOTE — Progress Notes (Signed)
   08/09/19  CC:  Chief Complaint  Patient presents with  . Cysto    HPI: Laura Reed is a 59 y.o. female with with history of pT1N0 left upper tract urothelial carcinoma s/p nephroU 4/016 with low grade Ta bladder recurrence s/p TURBT, mitomycin, right retrograde pyelogram on 2/18.    Patient denies urinary symptoms today.  1- Left Upper Tract Transitional Cell Carcinoma/ Bladder cancer recurrence- s/p LEFT robotic nephroureterectomy on 10/31/2014 as well as combined ventral hernia repair with Dr. Bary Castilla. Post complicated by wound infection now well healed. Pathology pT1N0 with negative margins.  Recent Surveillance: 02/2015 - cysto NED; 05/2015 cysto NED, 08/2015- cysto NED, 11/2015- NED, CT Urogram 11/12/15 negative, poor quality of delayed phase.; 5/2-17- cysto with mild erythema following recent UTI; 02/2016 NED; 08/2016 Lg Ta TCC recurrence, 1 cm, multifocal. RTG neg.  08/2016- TURBT for bladder 1 cm recurrence near bladder neck and left UO, multifocal. R RTG negative. Mitomycin. LgTa. 10/2016- Induction BCG x 6 08/2017- Maintenance BCG x 3 02/2019- Maintenance BCG x 3  2 - Solitary Kidney - s/p left nephrectomy as per above. Most recent Cr 0.99 07/12/2018  She does have an extensive smoking history, quit 5years ago but smoked up to 2 packs a day for 35 years. She also has multiple medical comorbidities including history of diabetes, CAD status post CABG, carotid endarterectomy.   Blood pressure (!) 159/65, pulse 79, height 5\' 7"  (1.702 m), weight 163 lb (73.9 kg). NED. A&Ox3.   No respiratory distress   Abd soft, NT, ND Normal external genitalia with patent urethral meatus  Cystoscopy Procedure Note  Patient identification was confirmed, informed consent was obtained, and patient was prepped using Betadine solution.  Lidocaine jelly was administered per urethral meatus.    Preoperative abx where received prior to procedure.    Procedure: - Flexible cystoscope  introduced, without any difficulty.   - Thorough search of the bladder revealed:    normal urethral meatus    -no evidence of papillary tumor.     no stones    no ulcers     no tumors    no urethral polyps    no trabeculation    Previously identified bladder erythema is not appreciated today  - Right UO normal. Left UO was surgically absent.  Post-Procedure: - Patient tolerated the procedure well  Assessment/ Plan:  1. Malignant neoplasm of urinary bladder, unspecified site [facility])  Personal history of upper tract urothelial carcinoma as well as bladder cancer, last recurrence 2018  Given the number of years since her last recurrence, feel comfortable spreading out her cystoscopy appointments to every 6 month basis  We discussed the role of repeat upper tract imaging.  Given that is been 5 years since her upper tract diagnosis status post nephro ureterectomy without recurrence to date, risk of recurrence at this point is quite low.  As such, we will hold off on repeat upper tract imaging at this time after discussing risk and benefits especially given her multiple medical comorbidities.  She is agreeable this plan.  Return in about 6 months (around 02/06/2020) for cysto.   Hollice Espy, MD

## 2019-08-11 ENCOUNTER — Other Ambulatory Visit: Payer: Self-pay

## 2019-08-11 DIAGNOSIS — Z794 Long term (current) use of insulin: Secondary | ICD-10-CM | POA: Diagnosis not present

## 2019-08-11 DIAGNOSIS — E1140 Type 2 diabetes mellitus with diabetic neuropathy, unspecified: Secondary | ICD-10-CM | POA: Diagnosis not present

## 2019-08-11 DIAGNOSIS — B351 Tinea unguium: Secondary | ICD-10-CM | POA: Diagnosis not present

## 2019-08-11 LAB — URINALYSIS, COMPLETE
Bilirubin, UA: NEGATIVE
Glucose, UA: NEGATIVE
Ketones, UA: NEGATIVE
Leukocytes,UA: NEGATIVE
Nitrite, UA: NEGATIVE
Specific Gravity, UA: <1.005 — ABNORMAL LOW (ref 1.005–1.030)
Urobilinogen, Ur: 0.2 mg/dL (ref 0.2–1.0)
pH, UA: 5.5 (ref 5.0–7.5)

## 2019-08-11 LAB — MICROSCOPIC EXAMINATION: Bacteria, UA: NONE SEEN

## 2019-08-11 MED ORDER — AMLODIPINE BESYLATE 5 MG PO TABS: ORAL_TABLET | ORAL | 1 refills | Status: DC

## 2019-08-22 ENCOUNTER — Other Ambulatory Visit: Payer: Self-pay

## 2019-08-22 MED ORDER — GABAPENTIN 600 MG PO TABS: 600.0000 mg | ORAL_TABLET | Freq: Three times a day (TID) | ORAL | 1 refills | Status: DC

## 2019-09-22 ENCOUNTER — Other Ambulatory Visit: Payer: Self-pay

## 2019-09-26 ENCOUNTER — Ambulatory Visit (INDEPENDENT_AMBULATORY_CARE_PROVIDER_SITE_OTHER): Payer: Medicare Other | Admitting: Internal Medicine

## 2019-09-26 ENCOUNTER — Other Ambulatory Visit: Payer: Self-pay

## 2019-09-26 ENCOUNTER — Encounter: Payer: Self-pay | Admitting: Internal Medicine

## 2019-09-26 ENCOUNTER — Telehealth: Payer: Self-pay | Admitting: Internal Medicine

## 2019-09-26 VITALS — BP 134/68 | HR 79 | Temp 97.7°F | Resp 15 | Ht 67.0 in | Wt 165.6 lb

## 2019-09-26 DIAGNOSIS — E1151 Type 2 diabetes mellitus with diabetic peripheral angiopathy without gangrene: Secondary | ICD-10-CM | POA: Diagnosis not present

## 2019-09-26 DIAGNOSIS — I70213 Atherosclerosis of native arteries of extremities with intermittent claudication, bilateral legs: Secondary | ICD-10-CM | POA: Diagnosis not present

## 2019-09-26 DIAGNOSIS — E1142 Type 2 diabetes mellitus with diabetic polyneuropathy: Secondary | ICD-10-CM | POA: Diagnosis not present

## 2019-09-26 LAB — COMPREHENSIVE METABOLIC PANEL
ALT: 17 U/L (ref 0–35)
AST: 14 U/L (ref 0–37)
Albumin: 4 g/dL (ref 3.5–5.2)
Alkaline Phosphatase: 98 U/L (ref 39–117)
BUN: 23 mg/dL (ref 6–23)
CO2: 25 mEq/L (ref 19–32)
Calcium: 9.3 mg/dL (ref 8.4–10.5)
Chloride: 98 mEq/L (ref 96–112)
Creatinine, Ser: 0.87 mg/dL (ref 0.40–1.20)
GFR: 66.19 mL/min (ref >60.00)
Glucose, Bld: 338 mg/dL — ABNORMAL HIGH (ref 70–99)
Potassium: 4.3 mEq/L (ref 3.5–5.1)
Sodium: 131 mEq/L — ABNORMAL LOW (ref 135–145)
Total Bilirubin: 0.5 mg/dL (ref 0.2–1.2)
Total Protein: 7.2 g/dL (ref 6.0–8.3)

## 2019-09-26 LAB — HEMOGLOBIN A1C: Hgb A1c MFr Bld: 8.8 % — ABNORMAL HIGH (ref 4.6–6.5)

## 2019-09-26 NOTE — Progress Notes (Signed)
Subjective:  Patient ID: Laura Reed, female    DOB: June 02, 1961  Age: 59 y.o. MRN: [id number]  CC: The primary encounter diagnosis was Diabetic peripheral neuropathy associated with type 2 diabetes mellitus ([place]). Diagnoses of Controlled type 2 DM with peripheral circulatory disorder (HCC) and Atherosclerosis of native artery of both lower extremities with intermittent claudication (HCC) were also pertinent to this visit.  HPI Laura Reed presents for diabetes follow up   Patient has not  received either dose of the available COVID 19 vaccine  But plans to do so .  Patient continues to mask when outside of the home except when walking in yard or at safe distances from others .  Patient denies any change in mood or development of unhealthy behaviors resuting from the pandemic's restriction of activities and socialization.    Saw podiatry in December .  Toenails debrided,  No callouses appreciated, no ulcers.   Wearing diabetic shoes.  Needs a 2nd pair so needs DME order and letter. 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  Patient has no complaints today.  Patient is not following a low glycemic index diet but is  taking all 30 ,  36 and 39  Units of Lixpro qac    And 30 units of Levemir.   At night .   Fasting sugars have been under less than 140 most of the time and post prandials have been under 160 except on rare occasions. Patient is exercising about 3 times per week and intentionally trying to lose weight .  Patient has not had an eye exam in the last 12 months but is scheduled for April 15.  She  checks feet regularly for signs of infection.  She has neuropathy and PAD with regular vascular and podiatry follow up.    Patient does not walk barefoot outside,    BP is high in the morning at home.  3 hours later it has dropped to normal range.  Takes BP meds at  Night .  dringk  5-6 cups off coffee every morning,  Often before she checks BP  Outpatient Medications Prior  to Visit  Medication Sig Dispense Refill  . acetaminophen (TYLENOL) 500 MG tablet Take 1,000 mg by mouth every 6 (six) hours as needed for mild pain or headache.     . ALPRAZolam (XANAX) 0.5 MG tablet Take 1 tablet (0.5 mg total) by mouth daily as needed. for anxiety 30 tablet 5  . amLODipine (NORVASC) 5 MG tablet TAKE 1 TABLET(5 MG) BY MOUTH DAILY 90 tablet 1  . aspirin 81 MG chewable tablet Chew 81 mg by mouth at bedtime.     Marland Kitchen atorvastatin (LIPITOR) 20 MG tablet Take 1 tablet (20 mg total) by mouth daily. 90 tablet 1  . Cholecalciferol (VITAMIN D3) 2000 UNITS TABS Take 2,000 Units by mouth daily after supper.     . clopidogrel (PLAVIX) 75 MG tablet Take 1 tablet (75 mg total) by mouth daily. 90 tablet 1  . digoxin (LANOXIN) 0.125 MG tablet Take 1 tablet (125 mcg total) by mouth daily. 90 tablet 1  . gabapentin (NEURONTIN) 600 MG tablet Take 1 tablet (600 mg total) by mouth 3 (three) times daily. 270 tablet 1  . glucose blood (ACCU-CHEK AVIVA PLUS) test strip Test blood sugar 2-3 times/ day. 300 each 1  . Insulin Pen Needle (B-D ULTRAFINE III SHORT PEN) 31G X 8 MM MISC TEST BLOOD SUGAR TWICE DAILY 100 each 2  .  Lancets Misc. MISC Use to check blood sugars 3 times daily 100 each 11  . LEVEMIR FLEXTOUCH 100 UNIT/ML Pen INJECT 40 UNITS UNDER THE SKIN EVERY DAY AT 10 PM (Patient taking differently: Inject 30 Units into the skin at bedtime. ) 15 mL 2  . Insulin Lispro Prot & Lispro (HUMALOG MIX 75/25 KWIKPEN) (75-25) 100 UNIT/ML Kwikpen INJECT 30 UNITS UNDER THE SKIN EVERY MORNING AND 36 UNITS EVERY EVENING 30 mL 2   No facility-administered medications prior to visit.    Review of Systems;  Patient denies headache, fevers, malaise, unintentional weight loss, skin rash, eye pain, sinus congestion and sinus pain, sore throat, dysphagia,  hemoptysis , cough, dyspnea, wheezing, chest pain, palpitations, orthopnea, edema, abdominal pain, nausea, melena, diarrhea, constipation, flank pain, dysuria,  hematuria, urinary  Frequency, nocturia, numbness, tingling, seizures,  Focal weakness, Loss of consciousness,  Tremor, insomnia, depression, anxiety, and suicidal ideation.      Objective:  BP 134/68 (BP Location: Left Arm, Patient Position: Sitting, Cuff Size: Normal)   Pulse 79   Temp 97.7 F (36.5 C) (Temporal)   Resp 15   Ht 5\' 7"  (1.702 m)   Wt 165 lb 9.6 oz (75.1 kg)   SpO2 98%   BMI 25.94 kg/m   BP Readings from Last 3 Encounters:  09/26/19 134/68  08/09/19 (!) 159/65  05/26/19 (!) 160/74    Wt Readings from Last 3 Encounters:  09/26/19 165 lb 9.6 oz (75.1 kg)  08/09/19 163 lb (73.9 kg)  05/26/19 173 lb 9.6 oz (78.7 kg)    General appearance: alert, cooperative and appears stated age Ears: normal TM's and external ear canals both ears Throat: lips, mucosa, and tongue normal; teeth and gums normal Neck: no adenopathy, no carotid bruit, supple, symmetrical, trachea midline and thyroid not enlarged, symmetric, no tenderness/mass/nodules Back: symmetric, no curvature. ROM normal. No CVA tenderness. Lungs: clear to auscultation bilaterally Heart: regular rate and rhythm, S1, S2 normal, no murmur, click, rub or gallop Abdomen: soft, non-tender; bowel sounds normal; no masses,  no organomegaly Pulses: 2+ and symmetric Skin: Skin color, texture, turgor normal. No rashes or lesions Lymph nodes: Cervical, supraclavicular, and axillary nodes normal.  Lab Results  Component Value Date   HGBA1C 8.8 (H) 09/26/2019   HGBA1C 7.7 (H) 05/26/2019   HGBA1C 7.4 (H) 12/03/2018    Lab Results  Component Value Date   CREATININE 0.87 09/26/2019   CREATININE 0.93 05/26/2019   CREATININE 0.91 01/19/2019    Lab Results  Component Value Date   WBC 8.9 01/19/2019   HGB 12.2 01/19/2019   HCT 37.1 01/19/2019   PLT 289 01/19/2019   GLUCOSE 338 (H) 09/26/2019   CHOL 111 05/26/2019   TRIG 64.0 05/26/2019   HDL 49.90 05/26/2019   LDLDIRECT 47.0 01/28/2017   LDLCALC 48 05/26/2019     ALT 17 09/26/2019   AST 14 09/26/2019   NA 131 (L) 09/26/2019   K 4.3 09/26/2019   CL 98 09/26/2019   CREATININE 0.87 09/26/2019   BUN 23 09/26/2019   CO2 25 09/26/2019   TSH 0.96 05/18/2015   INR 0.9 10/17/2014   HGBA1C 8.8 (H) 09/26/2019   MICROALBUR 18.0 (H) 05/26/2019    No results found.  Assessment & Plan:   Problem List Items Addressed This Visit      Unprioritized   Controlled type 2 DM with peripheral circulatory disorder (HCC)    Taking  levemir 30 units daily.  Mixed insulin twice daily  30  Units of  75/25 in am  And 36 units in the afternoon and 39 UNITS at dinner.  PM fastings  have been < 160 most of the time and post prandials < 160 as well. She needs to check her sugars a minimum o 3 times daily.  Referral to [name] to assist in getting patient a Free [employer]   Lab Results  Component Value Date   HGBA1C 8.8 (H) 09/26/2019         Relevant Medications   Insulin Lispro Prot & Lispro (HUMALOG MIX 75/25 KWIKPEN) (75-25) 100 UNIT/ML Kwikpen   Other Relevant Orders   Amb Referral to Clinical Pharmacist   Hemoglobin A1c (Completed)   Comprehensive metabolic panel (Completed)   For home use only DME Other see comment   Diabetic peripheral neuropathy associated with type 2 diabetes mellitus ([place]) - Primary   Relevant Medications   Insulin Lispro Prot & Lispro (HUMALOG MIX 75/25 KWIKPEN) (75-25) 100 UNIT/ML Kwikpen   Other Relevant Orders   For home use only DME Other see comment   Atherosclerosis of native arteries of extremity with intermittent claudication ([place])   Relevant Orders   For home use only DME Other see comment     I provided  30 minutes of  face-to-face time during this encounter reviewing patient's current problems and past surgeries, labs and imaging studies, providing counseling on the above mentioned problems , and coordination  of care .  I have changed Burnis Medin. Sochacki's Insulin Lispro Prot & Lispro. I am also having  her maintain her Vitamin D3, aspirin, acetaminophen, Lancets Misc., Levemir FlexTouch, Insulin Pen Needle, Accu-Chek Aviva Plus, ALPRAZolam, clopidogrel, digoxin, atorvastatin, amLODipine, and gabapentin.  Meds ordered this encounter  Medications  . Insulin Lispro Prot & Lispro (HUMALOG MIX 75/25 KWIKPEN) (75-25) 100 UNIT/ML Kwikpen    Sig: INJECT 30 UNITS UNDER THE SKIN BEFORE BREAKFAST . 36 UNITS BEFORE LUNCH AND 39 UNITS BEFORE DINNER    Dispense:  30 mL    Refill:  2    Medications Discontinued During This Encounter  Medication Reason  . Insulin Lispro Prot & Lispro (HUMALOG MIX 75/25 KWIKPEN) (75-25) 100 UNIT/ML Kwikpen     Follow-up: No follow-ups on file.   [name], MD

## 2019-09-26 NOTE — Telephone Encounter (Signed)
Will you mail spreadsheet for sugar logs or put in [place]

## 2019-09-26 NOTE — Patient Instructions (Addendum)
Take your Blood pressure reading before your first cup of coffee to see if it is lower/at goal  try reducing the  Caffeine  Intake since this my be raising your blood pressure     I have made Referral to Eula Fried, our in house pharmacisit for help in getting the scannable glucose monitor   Bring the spreadsheet with you to your meeting with Suzy Bouchard   The tingling at your left knee is due to neuropathy ++

## 2019-09-27 ENCOUNTER — Telehealth: Payer: Self-pay | Admitting: Internal Medicine

## 2019-09-27 DIAGNOSIS — E1151 Type 2 diabetes mellitus with diabetic peripheral angiopathy without gangrene: Secondary | ICD-10-CM

## 2019-09-27 MED ORDER — INSULIN LISPRO PROT & LISPRO (75-25 MIX) 100 UNIT/ML KWIKPEN: PEN_INJECTOR | SUBCUTANEOUS | 2 refills | Status: DC

## 2019-09-27 NOTE — Telephone Encounter (Signed)
Sugar logs have been mailed to pt.

## 2019-09-27 NOTE — Telephone Encounter (Signed)
Pt called and said pharmacy faxed over prescription refill request for Humalog mix 75/25 and they haven't received anything back. She said she only has a few left and need this called in soon so she can pick it up.

## 2019-09-27 NOTE — Assessment & Plan Note (Addendum)
Taking  levemir 30 units daily.  Mixed insulin twice daily  30  Units of  75/25 in am  And 36 units in the afternoon and 39 UNITS at dinner.  PM fastings  have been < 160 most of the time and post prandials < 160 as well. She needs to check her sugars a minimum o 3 times daily.  Referral to [name] to assist in getting patient a Free [employer] Monitor   Lab Results  Component Value Date   HGBA1C 8.8 (H) 09/26/2019

## 2019-09-28 MED ORDER — INSULIN LISPRO PROT & LISPRO (75-25 MIX) 100 UNIT/ML KWIKPEN: PEN_INJECTOR | SUBCUTANEOUS | 2 refills | Status: DC

## 2019-09-28 NOTE — Telephone Encounter (Signed)
Medication has been refilled.

## 2019-10-10 DIAGNOSIS — I10 Essential (primary) hypertension: Secondary | ICD-10-CM | POA: Diagnosis not present

## 2019-10-10 DIAGNOSIS — E1151 Type 2 diabetes mellitus with diabetic peripheral angiopathy without gangrene: Secondary | ICD-10-CM | POA: Diagnosis not present

## 2019-10-10 DIAGNOSIS — E782 Mixed hyperlipidemia: Secondary | ICD-10-CM | POA: Diagnosis not present

## 2019-10-10 DIAGNOSIS — I25810 Atherosclerosis of coronary artery bypass graft(s) without angina pectoris: Secondary | ICD-10-CM | POA: Diagnosis not present

## 2019-10-10 DIAGNOSIS — I70219 Atherosclerosis of native arteries of extremities with intermittent claudication, unspecified extremity: Secondary | ICD-10-CM | POA: Diagnosis not present

## 2019-10-14 ENCOUNTER — Other Ambulatory Visit: Payer: Self-pay

## 2019-10-14 MED ORDER — ACCU-CHEK AVIVA PLUS VI STRP: ORAL_STRIP | 1 refills | Status: DC

## 2019-10-14 MED ORDER — LEVEMIR FLEXTOUCH 100 UNIT/ML ~~LOC~~ SOPN: PEN_INJECTOR | SUBCUTANEOUS | 2 refills | Status: DC

## 2019-10-17 ENCOUNTER — Ambulatory Visit (INDEPENDENT_AMBULATORY_CARE_PROVIDER_SITE_OTHER): Payer: Medicare Other

## 2019-10-17 ENCOUNTER — Other Ambulatory Visit: Payer: Self-pay

## 2019-10-17 ENCOUNTER — Encounter (INDEPENDENT_AMBULATORY_CARE_PROVIDER_SITE_OTHER): Payer: Self-pay | Admitting: Vascular Surgery

## 2019-10-17 ENCOUNTER — Ambulatory Visit (INDEPENDENT_AMBULATORY_CARE_PROVIDER_SITE_OTHER): Payer: Medicare Other | Admitting: Vascular Surgery

## 2019-10-17 VITALS — BP 112/64 | HR 72 | Ht 67.0 in | Wt 164.0 lb

## 2019-10-17 DIAGNOSIS — I6523 Occlusion and stenosis of bilateral carotid arteries: Secondary | ICD-10-CM

## 2019-10-17 DIAGNOSIS — I25118 Atherosclerotic heart disease of native coronary artery with other forms of angina pectoris: Secondary | ICD-10-CM

## 2019-10-17 DIAGNOSIS — E1169 Type 2 diabetes mellitus with other specified complication: Secondary | ICD-10-CM

## 2019-10-17 DIAGNOSIS — E1151 Type 2 diabetes mellitus with diabetic peripheral angiopathy without gangrene: Secondary | ICD-10-CM

## 2019-10-17 DIAGNOSIS — E785 Hyperlipidemia, unspecified: Secondary | ICD-10-CM

## 2019-10-17 DIAGNOSIS — I70213 Atherosclerosis of native arteries of extremities with intermittent claudication, bilateral legs: Secondary | ICD-10-CM | POA: Diagnosis not present

## 2019-10-18 ENCOUNTER — Encounter (INDEPENDENT_AMBULATORY_CARE_PROVIDER_SITE_OTHER): Payer: Self-pay | Admitting: Vascular Surgery

## 2019-10-18 NOTE — Progress Notes (Signed)
MRN : [id number]  Laura Reed is a 59 y.o. (26-May-1961) female who presents with chief complaint of  Chief Complaint  Patient presents with  . Follow-up    U/S Follow up  .  History of Present Illness:   The patient returns to the office for followup and review status post angiogram with intervention.  She is s/p angiogram on 05/05/2018: 1.Percutaneous transluminal angioplasty and stent placementleftpopliteal 2. Percutaneous transluminal angioplasty of the right anterior tibial artery to 3 mm  The patient notes improvement in the lower extremity symptoms. No interval shortening of the patient's claudication distance or rest pain symptoms. Previous wounds have now healed. No new ulcers or wounds have occurred since the last visit.  There have been no significant changes to the patient's overall health care.  The patient denies amaurosis fugax or recent TIA symptoms. There are no recent neurological changes noted. The patient denies history of DVT, PE or superficial thrombophlebitis. The patient denies recent episodes of angina or shortness of breath.   ABI's Rt=0.82and Lt=0.89(previous Rt=0.82and Lt=0.79) Duplex ultrasound done today of the bilateral lower extremities shows patent bypasses bilaterally no hemodynamically significant stenosis  Carotid Duplex today shows widely patent RICA s/p endarterectomy and [id number] LICA stenosis, no change from last study   No outpatient medications have been marked as taking for the 10/17/19 encounter (Office Visit) with Delana Meyer, Dolores Lory, MD.    Past Medical History:  Diagnosis Date  . Anxiety   . Bladder cancer ([place])   . CHF (congestive heart failure) ([place])   . Coronary artery disease   . Diabetes mellitus   . Heart murmur   . Hemorrhoid   . Hypertension   . Neuropathy   . PVD (peripheral vascular disease) ([place])   . Thyroid nodule    right  . Urothelial carcinoma of kidney ([place]) 10/31/2014   INVASIVE  UROTHELIAL CARCINOMA, LOW GRADE. T1, Nx.    Past Surgical History:  Procedure Laterality Date  . AMPUTATION TOE     right (4th and 5th); left (great toe, 3rd)  . AMPUTATION TOE Right 07/16/2018   Procedure: AMPUTATION TOE/MPJ right 2nd;  Surgeon: Sharlotte Alamo, DPM;  Location: [facility];  Service: Podiatry;  Laterality: Right;  . ARTERIAL BYPASS SURGRY  2009, 2013 x 2   right leg , done in [place]  . CARDIAC CATHETERIZATION    . CAROTID ENDARTERECTOMY Right 01/2014   Dr Delana Meyer  . CATARACT EXTRACTION W/PHACO Right 12/14/2014   Procedure: CATARACT EXTRACTION PHACO AND INTRAOCULAR LENS PLACEMENT (IOC);  Surgeon: Lyla Glassing, MD;  Location: [facility];  Service: Ophthalmology;  Laterality: Right;  [place]   00:38.6              AP        7.1                   CDE  2.76  . CESAREAN SECTION    . CHOLECYSTECTOMY  03-03-12   Porcelain gallbladder, gallstones,  [place]  . COLONOSCOPY W/ BIOPSIES  04/28/2012   Hyperplastic rectal polyps.  . CORONARY ARTERY BYPASS GRAFT  2009   3 vessel  . CYSTOSCOPY W/ RETROGRADES Right 09/01/2016   Procedure: CYSTOSCOPY WITH RETROGRADE PYELOGRAM;  Surgeon: Hollice Espy, MD;  Location: [facility];  Service: Urology;  Laterality: Right;  . EYE SURGERY    . HERNIA REPAIR  10-31-14   ventral, retro-rectus atrium mesh  . IRRIGATION AND DEBRIDEMENT FOOT Left 01/18/2019   Procedure: IRRIGATION AND  DEBRIDEMENT FOOT;  Surgeon: Sharlotte Alamo, DPM;  Location: [facility];  Service: Podiatry;  Laterality: Left;  . LOWER EXTREMITY ANGIOGRAPHY Left 12/10/2016   Procedure: Lower Extremity Angiography;  Surgeon: Katha Cabal, MD;  Location: [place] [facility];  Service: Cardiovascular;  Laterality: Left;  . LOWER EXTREMITY ANGIOGRAPHY Left 02/02/2018   Procedure: LOWER EXTREMITY ANGIOGRAPHY;  Surgeon: Katha Cabal, MD;  Location: [place] [facility];  Service: Cardiovascular;  Laterality: Left;  . LOWER EXTREMITY ANGIOGRAPHY Left 05/05/2018   Procedure: LOWER EXTREMITY  ANGIOGRAPHY;  Surgeon: Katha Cabal, MD;  Location: [place] [facility];  Service: Cardiovascular;  Laterality: Left;  . NEPHRECTOMY Left 10-31-14  . PERIPHERAL VASCULAR CATHETERIZATION Left 05/01/2015   Procedure: Lower Extremity Angiography;  Surgeon: Katha Cabal, MD;  Location: [place] [facility];  Service: Cardiovascular;  Laterality: Left;  . PERIPHERAL VASCULAR CATHETERIZATION  05/01/2015   Procedure: Lower Extremity Intervention;  Surgeon: Katha Cabal, MD;  Location: [place] [facility];  Service: Cardiovascular;;  . PERIPHERAL VASCULAR CATHETERIZATION Left 02/20/2015   Procedure: Pelvic Angiography;  Surgeon: Katha Cabal, MD;  Location: [place] [facility];  Service: Cardiovascular;  Laterality: Left;  . TRANSURETHRAL RESECTION OF BLADDER TUMOR WITH MITOMYCIN-C N/A 09/01/2016   Procedure: TRANSURETHRAL RESECTION OF BLADDER TUMOR WITH MITOMYCIN-C;  Surgeon: Hollice Espy, MD;  Location: [facility];  Service: Urology;  Laterality: N/A;    Social History Social History   Tobacco Use  . Smoking status: Former Smoker    Packs/day: 2.00    Years: 35.00    Pack years: 70.00    Types: Cigarettes    Quit date: 03/30/2011    Years since quitting: 8.5  . Smokeless tobacco: Never Used  Substance Use Topics  . Alcohol use: No    Alcohol/week: 0.0 standard drinks  . Drug use: Not Currently    Types: Cocaine    Comment: last used in 2007    Family History Family History  Problem Relation Age of Onset  . Cancer Mother 8       Lung Cancer  . Cancer Father 10       Lung Ca  . Diabetes Son   . Breast cancer Maternal Grandmother   . Kidney cancer Neg Hx   . Bladder Cancer Neg Hx   . Prostate cancer Neg Hx     No Known Allergies   REVIEW OF SYSTEMS (Negative unless checked)  Constitutional: [] Weight loss  [] Fever  [] Chills Cardiac: [] Chest pain   [] Chest pressure   [] Palpitations   [] Shortness of breath when laying flat   [] Shortness of breath with  exertion. Vascular:  [x] Pain in legs with walking   [] Pain in legs at rest  [] History of DVT   [] Phlebitis   [] Swelling in legs   [] Varicose veins   [] Non-healing ulcers Pulmonary:   [] Uses home oxygen   [] Productive cough   [] Hemoptysis   [] Wheeze  [x] COPD   [] Asthma Neurologic:  [] Dizziness   [] Seizures   [] History of stroke   [] History of TIA  [] Aphasia   [] Vissual changes   [] Weakness or numbness in arm   [] Weakness or numbness in leg Musculoskeletal:   [] Joint swelling   [x] Joint pain   [] Low back pain Hematologic:  [] Easy bruising  [] Easy bleeding   [] Hypercoagulable state   [] Anemic Gastrointestinal:  [] Diarrhea   [] Vomiting  [] Gastroesophageal reflux/heartburn   [] Difficulty swallowing. Genitourinary:  [] Chronic kidney disease   [] Difficult urination  [] Frequent urination   [] Blood  in urine Skin:  [] Rashes   [] Ulcers  Psychological:  [] History of anxiety   []  History of major depression.  Physical Examination  Vitals:   10/17/19 1113  BP: 112/64  Pulse: 72  Weight: 164 lb (74.4 kg)  Height: 5\' 7"  (1.702 m)   Body mass index is 25.69 kg/m. Gen: WD/WN, NAD Head: [place]/AT, No temporalis wasting.  Ear/Nose/Throat: Hearing grossly intact, nares w/o erythema or drainage Eyes: PER, EOMI, sclera nonicteric.  Neck: Supple, no large masses.   Pulmonary:  Good air movement, no audible wheezing bilaterally, no use of accessory muscles.  Cardiac: RRR, no JVD Vascular: Well-healed right carotid incisional scar 2 out of 5 left carotid bruit Vessel Right Left  Radial Palpable Palpable  Carotid Palpable Palpable  PT Not Palpable Not Palpable  DP Not Palpable Not Palpable  Gastrointestinal: Non-distended. No guarding/no peritoneal signs.  Musculoskeletal: M/S 5/5 throughout.  No deformity or atrophy.  Neurologic: CN 2-12 intact. Symmetrical.  Speech is fluent. Motor exam as listed above. Psychiatric: Judgment intact, Mood & affect appropriate for pt's clinical situation. Dermatologic: No  rashes or ulcers noted.  No changes consistent with cellulitis.   CBC Lab Results  Component Value Date   WBC 8.9 01/19/2019   HGB 12.2 01/19/2019   HCT 37.1 01/19/2019   MCV 87.7 01/19/2019   PLT 289 01/19/2019    BMET    Component Value Date/Time   NA 131 (L) 09/26/2019 1057   NA 135 11/02/2014 0609   K 4.3 09/26/2019 1057   K 4.2 11/02/2014 0609   CL 98 09/26/2019 1057   CL 107 11/02/2014 0609   CO2 25 09/26/2019 1057   CO2 23 11/02/2014 0609   GLUCOSE 338 (H) 09/26/2019 1057   GLUCOSE 108 (H) 11/02/2014 0609   BUN 23 09/26/2019 1057   BUN 20 11/02/2014 0609   CREATININE 0.87 09/26/2019 1057   CREATININE 1.01 11/09/2015 1549   CREATININE 1.01 11/09/2015 1549   CALCIUM 9.3 09/26/2019 1057   CALCIUM 7.3 (L) 11/02/2014 0609   GFRNONAA >60 01/19/2019 0357   GFRNONAA 50 (L) 11/02/2014 0609   GFRAA >60 01/19/2019 0357   GFRAA 58 (L) 11/02/2014 0609   CrCl cannot be calculated (Patient's most recent lab result is older than the maximum 21 days allowed.).  COAG Lab Results  Component Value Date   INR 0.9 10/17/2014   INR 1.1 01/19/2014   INR 0.9 12/06/2013    Radiology No results found.   Assessment/Plan 1. Atherosclerosis of native artery of both lower extremities with intermittent claudication (HCC) Recommend:  The patient is status post successful angiogram with intervention. The patient reports that the claudication symptoms and leg pain is essentially gone. The patient denies lifestyle limiting changes at this point in time.  No further invasive studies, angiography or surgery at this time The patient should continue walking and begin a more formal exercise program.  The patient should continue antiplatelet therapy and aggressive treatment of the lipid abnormalities  Smoking cessation was again discussed  The patient should continue wearing graduated compression socks 10-15 mmHg strength to control the mild edema.  Patient should undergo  noninvasive studies as ordered. The patient will follow up with me after the studies. - VAS [place] ABI WITH/WO TBI; Future  2. Bilateral carotid artery stenosis Recommend:  Given the patient's asymptomatic subcritical stenosis no further invasive testing or surgery at this time.  Duplex ultrasound showsless than 50%stenosis bilaterally.  Continue antiplatelet therapy as prescribed Continue management of CAD,  HTN and Hyperlipidemia Healthy heart diet, encouraged exercise at least 4 times per week  Follow up in19months with duplex ultrasound and physical exam  - VAS US CAROTID; Future  3. Coronary artery disease of native artery of native heart with stable angina pectoris (HCC) Continue cardiac and antihypertensive medications as already ordered and reviewed, no changes at this time.  Continue statin as ordered and reviewed, no changes at this time  Nitrates PRN for chest pain   4. Controlled type 2 DM with peripheral circulatory disorder (HCC) Continue hypoglycemic medications as already ordered, these medications have been reviewed and there are no changes at this time.  Hgb A1C to be monitored as already arranged by primary service   5. Hyperlipidemia associated with type 2 diabetes mellitus ([place]) Continue statin as ordered and reviewed, no changes at this time      Hortencia Pilar, MD  10/18/2019 11:06 AM

## 2019-10-20 DIAGNOSIS — E113512 Type 2 diabetes mellitus with proliferative diabetic retinopathy with macular edema, left eye: Secondary | ICD-10-CM | POA: Diagnosis not present

## 2019-10-20 LAB — HM DIABETES EYE EXAM

## 2019-11-03 ENCOUNTER — Telehealth: Payer: Self-pay | Admitting: Internal Medicine

## 2019-11-03 NOTE — Chronic Care Management (AMB) (Signed)
  Chronic Care Management   Outreach Note  11/03/2019 Name: Laura Reed MRN: [id number] DOB: 06/08/1961  Laura Reed is a 58 y.o. year old female who is a primary care patient of Derrel Nip, [name], MD. I reached out to Laura Reed by phone today in response to a referral sent by Laura Reed PCP, [name], MD.     An unsuccessful telephone outreach was attempted today. The patient was referred to the case management team for assistance with care management and care coordination.   Follow Up Plan: A HIPPA compliant phone message was left for the patient providing contact information and requesting a return call. The care management team will reach out to the patient again over the next 7 days. If patient returns call to provider office, please advise to call [place] at [phone].  [place], [place] [address] Direct Dial: [phone] [email or web address]: [email or web address]

## 2019-11-04 NOTE — Chronic Care Management (AMB) (Signed)
  Chronic Care Management   Note  11/04/2019 Name: ASHAYLA SUBIA MRN: [id number] DOB: 1960/11/19  ISSA LUSTER is a 59 y.o. year old female who is a primary care patient of Derrel Nip, [name], MD. I reached out to [name] by phone today in response to a referral sent by Ms. Davy Pique PCP, [name], MD.  Ms. Deanda was given information about Chronic Care Management services today including:  1. CCM service includes personalized support from designated clinical staff supervised by her physician, including individualized plan of care and coordination with other care providers 2. 24/7 contact phone numbers for assistance for urgent and routine care needs. 3. Service will only be billed when office clinical staff spend 20 minutes or more in a month to coordinate care. 4. Only one practitioner may furnish and bill the service in a calendar month. 5. The patient may stop CCM services at any time (effective at the end of the month) by phone call to the office staff. 6. The patient will be responsible for cost sharing (co-pay) of up to 20% of the service fee (after annual deductible is met).  Patient agreed to services and verbal consent obtained.   Follow up plan: Telephone appointment with care management team member scheduled for:11/21/2019  [place], [place], [place] [address] Direct Dial: [email or web address] Website: [email or web address]

## 2019-11-08 DIAGNOSIS — E1140 Type 2 diabetes mellitus with diabetic neuropathy, unspecified: Secondary | ICD-10-CM | POA: Diagnosis not present

## 2019-11-08 DIAGNOSIS — L97521 Non-pressure chronic ulcer of other part of left foot limited to breakdown of skin: Secondary | ICD-10-CM | POA: Diagnosis not present

## 2019-11-08 DIAGNOSIS — Z794 Long term (current) use of insulin: Secondary | ICD-10-CM | POA: Diagnosis not present

## 2019-11-08 DIAGNOSIS — B351 Tinea unguium: Secondary | ICD-10-CM | POA: Diagnosis not present

## 2019-11-18 DIAGNOSIS — E113512 Type 2 diabetes mellitus with proliferative diabetic retinopathy with macular edema, left eye: Secondary | ICD-10-CM | POA: Diagnosis not present

## 2019-11-21 ENCOUNTER — Ambulatory Visit (INDEPENDENT_AMBULATORY_CARE_PROVIDER_SITE_OTHER): Payer: Medicare Other | Admitting: Pharmacist

## 2019-11-21 ENCOUNTER — Encounter: Payer: Self-pay | Admitting: Pharmacist

## 2019-11-21 DIAGNOSIS — E1142 Type 2 diabetes mellitus with diabetic polyneuropathy: Secondary | ICD-10-CM | POA: Diagnosis not present

## 2019-11-21 DIAGNOSIS — E1165 Type 2 diabetes mellitus with hyperglycemia: Secondary | ICD-10-CM | POA: Diagnosis not present

## 2019-11-21 DIAGNOSIS — E1140 Type 2 diabetes mellitus with diabetic neuropathy, unspecified: Secondary | ICD-10-CM | POA: Diagnosis not present

## 2019-11-21 DIAGNOSIS — Z794 Long term (current) use of insulin: Secondary | ICD-10-CM

## 2019-11-21 DIAGNOSIS — I739 Peripheral vascular disease, unspecified: Secondary | ICD-10-CM

## 2019-11-21 MED ORDER — ACCU-CHEK AVIVA PLUS VI STRP: ORAL_STRIP | 3 refills | Status: DC

## 2019-11-21 MED ORDER — BD PEN NEEDLE SHORT U/F 31G X 8 MM MISC: 3 refills | Status: DC

## 2019-11-21 MED ORDER — INSULIN LISPRO (1 UNIT DIAL) 100 UNIT/ML (KWIKPEN): 10.0000 [IU] | PEN_INJECTOR | Freq: Three times a day (TID) | SUBCUTANEOUS | 3 refills | Status: DC

## 2019-11-21 MED ORDER — TRESIBA FLEXTOUCH 100 UNIT/ML ~~LOC~~ SOPN: 50.0000 [IU] | PEN_INJECTOR | Freq: Every day | SUBCUTANEOUS | 3 refills | Status: DC

## 2019-11-21 MED ORDER — LANCETS MISC. MISC: 3 refills | Status: DC

## 2019-11-21 MED ORDER — OZEMPIC (0.25 OR 0.5 MG/DOSE) 2 MG/1.5ML ~~LOC~~ SOPN: PEN_INJECTOR | SUBCUTANEOUS | 2 refills | Status: DC

## 2019-11-21 NOTE — Chronic Care Management (AMB) (Signed)
Chronic Care Management   Note  11/21/2019 Name: Laura Reed MRN: [id number] DOB: Aug 31, 1960   Subjective:  Laura Reed is a 59 y.o. year old female who is a primary care patient of [name], [name], MD. The CCM team was consulted for assistance with chronic disease management and care coordination needs.    Contacted patient for medication management review.   Review of patient status, including review of consultants reports, laboratory and other test data, was performed as part of comprehensive evaluation and provision of chronic care management services.   SDOH (Social Determinants of Health) assessments and interventions performed:  yes  Objective:  Lab Results  Component Value Date   CREATININE 0.87 09/26/2019   CREATININE 0.93 05/26/2019   CREATININE 0.91 01/19/2019    Lab Results  Component Value Date   HGBA1C 8.8 (H) 09/26/2019       Component Value Date/Time   CHOL 111 05/26/2019 1051   TRIG 64.0 05/26/2019 1051   HDL 49.90 05/26/2019 1051   CHOLHDL 2 05/26/2019 1051   VLDL 12.8 05/26/2019 1051   LDLCALC 48 05/26/2019 1051   LDLDIRECT 47.0 01/28/2017 1509    Clinical ASCVD: No  The ASCVD Risk score Mikey Bussing DC Jr., et al., 2013) failed to calculate for the following reasons:   The valid total cholesterol range is 130 to 320 mg/dL    BP Readings from Last 3 Encounters:  10/17/19 112/64  09/26/19 134/68  08/09/19 (!) 159/65    No Known Allergies  Medications Reviewed Today    Reviewed by De Hollingshead, [facility] (Pharmacist) on 11/21/19 at 1442  Med List Status: <None>  Medication Order Taking? Sig Documenting Provider Last Dose Status Informant  acetaminophen (TYLENOL) 500 MG tablet [id number] Yes Take 1,000 mg by mouth every 6 (six) hours as needed for mild pain or headache.  [provider] Taking Active Self  ALPRAZolam (XANAX) 0.5 MG tablet [id number] Yes Take 1 tablet (0.5 mg total) by mouth daily as needed. for anxiety [name],  MD Taking Active   amLODipine (NORVASC) 5 MG tablet [id number] Yes TAKE 1 TABLET(5 MG) BY MOUTH DAILY [name], MD Taking Active   aspirin 81 MG chewable tablet [id number] Yes Chew 81 mg by mouth at bedtime.  [provider] Taking Active Self  atorvastatin (LIPITOR) 20 MG tablet [id number] Yes Take 1 tablet (20 mg total) by mouth daily. [name], MD Taking Active   Cholecalciferol (VITAMIN D3) 2000 UNITS TABS [id number] Yes Take 2,000 Units by mouth daily after supper.  [provider] Taking Active Self  clopidogrel (PLAVIX) 75 MG tablet [id number] Yes Take 1 tablet (75 mg total) by mouth daily. [name], MD Taking Active   gabapentin (NEURONTIN) 600 MG tablet [id number] Yes Take 1 tablet (600 mg total) by mouth 3 (three) times daily. [name], MD Taking Active   glucose blood (ACCU-CHEK AVIVA PLUS) test strip [id number] Yes Test blood sugar 2-3 times/ day. [name], MD Taking Active   insulin detemir (LEVEMIR FLEXTOUCH) 100 UNIT/ML FlexPen [id number] Yes INJECT 40 UNITS UNDER THE SKIN EVERY DAY AT 10 PM [name], MD Taking Active   Insulin Lispro Prot & Lispro (HUMALOG MIX 75/25 KWIKPEN) (75-25) 100 UNIT/ML Claiborne Rigg [id number] Yes INJECT 30 UNITS UNDER THE SKIN BEFORE BREAKFAST . 36 UNITS BEFORE LUNCH AND 39 UNITS BEFORE DINNER [name], MD Taking Active            Med Note (  [facility] Nov 21, 2019  2:41 PM) 30/39  Insulin Pen Needle (B-D ULTRAFINE III SHORT PEN) 31G X 8 MM MISC [id number] Yes TEST BLOOD SUGAR TWICE DAILY [name], MD Taking Active Self  Lancets Misc. [place] [id number] Yes Use to check blood sugars 3 times daily [name], MD Taking Active Self  losartan (COZAAR) 50 MG tablet [id number] Yes Take 50 mg by mouth daily. [provider] Taking Active            Assessment:   Goals Addressed            This Visit's Progress     Patient Stated   . PharmD "I want to control my blood  sugars better" (pt-stated)       CARE PLAN ENTRY (see longtitudinal plan of care for additional care plan information)  Current Barriers:  . Diabetes: uncontrolled; complicated by chronic medical conditions including PVD, CAD, peripheral neuropathy, s/p nephrectomy, most recent A1c 8.8% o Patient notes visual difficulties. Has cataract extraction coming up, but may not regain full vision. Requests anything be given with larger font size.  . Most recent eGFR: ~60 mL/min . Current antihyperglycemic regimen: Levemir 40 units QPM, Humalog 75/25 30 units with breakfast, 39 units w/ supper o Resistant to metformin d/t things she has heard about lawsuits . Reports some episodes of hypoglycemia, also indicates hypoglycemic unawareness over the past few years, only has symptoms when sugars dip below 50 . Current meal patterns: o Breakfast: 1 piece of toast, peanut butter, banana if "normal" fasting, will eat 1/2 piece of toast 1/2 banana if fasting sugar higher o Lunch: Sandwich, pretzels o Supper: Salad, chicken, spaghetti o Snacks: Atkins bars, sugar-free pudding w/ cool whip, apples, grapes o Drinks: 5 cups of coffee daily; tea w/ lemon . Current blood glucose readings:  o Fasting: 130-150s o Midday: 140s o Evening: 99-147 . Cardiovascular risk reduction: o Current hypertensive regimen: amlodipine 5 mg QPM, losartan 50 mg daily  o Current hyperlipidemia regimen: atorvastatin 20 mg daily  o Current antiplatelet regimen: ASA 81 mg daily, clopidogrel 75 mg daily   Pharmacist Clinical Goal(s):  Marland Kitchen Over the next 90 days, patient will work with PharmD and primary care provider to address optimized medication management   Interventions: . Comprehensive medication review performed, medication list updated in electronic medical record . Inter-disciplinary care team collaboration (see longitudinal plan of care) . Given # of insulin injections and # of blood glucose checks, will pursue coverage for  FreeStyle Libre2 CGM. Choosing the [place] 2 w/ alarms due to patient's hypoglycemic unawareness. Will investigate which DME supplier covers [facility] and collaborate w/ office staff to complete application paperwork . Reviewed risks of high doses of insulin (hypoglycemia, weight gain). Interested in adding non-insulin medications. Resistant to metformin. Discussed GLP1. Thyroid nodule, but not concerning for cancer. Start Ozempic 0.25 mg weekly for 4 weeks, then increased to 0.5 mg weekly. Reviewed side effects of GI upset and weight gain, advised to proactively reduce meal sizes to prevent GI discomfort. Patient verbalized understanding . Reviewed sub-optimal insulin regimen d/t PK. D/c Levemir and Humalog 70/30. Start Tresiba 50 units daily (combination of Levemir basal units + Humalog 70 "basal" units, w/dose reduction d/t prolonged t1/2 and addition of Ozempic). Start Humalog 10 units TID with meals, as patient eats 3 similarly sized meals daily.  . Check blood sugar fasting and post prandial daily, 4 times daily. Updated pen needle, strips, and lancets  dose sent.   Patient Self Care Activities:  . Patient will check blood glucose four times daily, document, and provide at future appointments . Patient will take medications as prescribed . Patient will contact provider with any episodes of hypoglycemia . Patient will report any questions or concerns to provider   Initial goal documentation        Plan: - Will collaborate w/ patient, provider, and office staff as above. Once we determine plan for CGM, will schedule visit for placement and teaching  [name], PharmD, [place], [place] Pharmacist [place] [phone]

## 2019-11-21 NOTE — Patient Instructions (Addendum)
Ms. Evers,   It was great talking with you today!  1) As discussed, stop Levemir and Humalog 75/25. Start Tresiba 50 units once daily (new long acting insulin) and plain Humalog 10 units three times daily with meals  2) Start Ozempic 0.25 mg once weekly for 4 weeks, then increase to 0.5 mg once weekly. This can cause some stomach upset, so plan to proactively decrease your portion sizes.   3) We will work on the [employer] Continuous Glucose Monitor coverage and will be in touch. In the meantime, check sugars 4 times daily - fasting, and 2 hours after each meal. We are shooting for a goal fasting sugars <130 and after meal sugars <180.   Here is each medication and what it is for:   Blood Pressure: - Losartan 50 mg  - Amlodipine 5 mg   Stroke/Blockage Prevention: - Aspirin 81 mg daily - Clopidogrel 75 mg daily  Cholesterol/Stroke Prevention: - Atorvastatin 20 mg daily   Nerve Pain: - Gabapentin 600 mg three times daily  Anxiety: - Alprazolam 0.5 mg every evening   Call with any questions!  [name], PharmD, [place]  Visit Information  Goals Addressed            This Visit's Progress     Patient Stated   . PharmD "I want to control my blood sugars better" (pt-stated)       CARE PLAN ENTRY (see longtitudinal plan of care for additional care plan information)  Current Barriers:  . Diabetes: uncontrolled; complicated by chronic medical conditions including PVD, CAD, peripheral neuropathy, s/p nephrectomy, most recent A1c 8.8% o Patient notes visual difficulties. Has cataract extraction coming up, but may not regain full vision. Requests anything be given with larger font size.  . Most recent eGFR: ~60 mL/min . Current antihyperglycemic regimen: Levemir 40 units QPM, Humalog 75/25 30 units with breakfast, 39 units w/ supper o Resistant to metformin d/t things she has heard about lawsuits . Reports some episodes of hypoglycemia, also indicates  hypoglycemic unawareness over the past few years, only has symptoms when sugars dip below 50 . Current meal patterns: o Breakfast: 1 piece of toast, peanut butter, banana if "normal" fasting, will eat 1/2 piece of toast 1/2 banana if fasting sugar higher o Lunch: Sandwich, pretzels o Supper: Salad, chicken, spaghetti o Snacks: Atkins bars, sugar-free pudding w/ cool whip, apples, grapes o Drinks: 5 cups of coffee daily; tea w/ lemon . Current blood glucose readings:  o Fasting: 130-150s o Midday: 140s o Evening: 99-147 . Cardiovascular risk reduction: o Current hypertensive regimen: amlodipine 5 mg QPM, losartan 50 mg daily  o Current hyperlipidemia regimen: atorvastatin 20 mg daily  o Current antiplatelet regimen: ASA 81 mg daily, clopidogrel 75 mg daily   Pharmacist Clinical Goal(s):  Marland Kitchen Over the next 90 days, patient will work with PharmD and primary care provider to address optimized medication management   Interventions: . Comprehensive medication review performed, medication list updated in electronic medical record . Inter-disciplinary care team collaboration (see longitudinal plan of care) . Given # of insulin injections and # of blood glucose checks, will pursue coverage for FreeStyle Libre2 CGM. Choosing the [place] 2 w/ alarms due to patient's hypoglycemic unawareness. Will investigate which DME supplier covers [facility] and collaborate w/ office staff to complete application paperwork . Reviewed risks of high doses of insulin (hypoglycemia, weight gain). Interested in adding non-insulin medications. Resistant to metformin. Discussed GLP1. Thyroid nodule, but not concerning for  cancer. Start Ozempic 0.25 mg weekly for 4 weeks, then increased to 0.5 mg weekly. Reviewed side effects of GI upset and weight gain, advised to proactively reduce meal sizes to prevent GI discomfort. Patient verbalized understanding . Reviewed sub-optimal insulin regimen d/t PK. D/c Levemir and Humalog  70/30. Start Tresiba 50 units daily (combination of Levemir basal units + Humalog 70 "basal" units, w/dose reduction d/t prolonged t1/2 and addition of Ozempic). Start Humalog 10 units TID with meals, as patient eats 3 similarly sized meals daily.  . Check blood sugar fasting and post prandial daily, 4 times daily. Updated pen needle, strips, and lancets dose sent.   Patient Self Care Activities:  . Patient will check blood glucose four times daily, document, and provide at future appointments . Patient will take medications as prescribed . Patient will contact provider with any episodes of hypoglycemia . Patient will report any questions or concerns to provider   Initial goal documentation        The patient verbalized understanding of instructions provided today and agreed to receive a mailed copy of patient instruction and/or educational materials.  Plan: - Will collaborate w/ patient, provider, and office staff as above. Once we determine plan for CGM, will schedule visit for placement and teaching  [name], PharmD, [place], [place] Pharmacist [place] [phone]

## 2019-11-25 DIAGNOSIS — I2510 Atherosclerotic heart disease of native coronary artery without angina pectoris: Secondary | ICD-10-CM | POA: Diagnosis not present

## 2019-11-25 DIAGNOSIS — H2512 Age-related nuclear cataract, left eye: Secondary | ICD-10-CM | POA: Diagnosis not present

## 2019-11-28 ENCOUNTER — Other Ambulatory Visit: Payer: Self-pay

## 2019-11-28 ENCOUNTER — Encounter: Payer: Self-pay | Admitting: Ophthalmology

## 2019-12-01 ENCOUNTER — Ambulatory Visit: Payer: Self-pay | Admitting: Pharmacist

## 2019-12-01 DIAGNOSIS — Z794 Long term (current) use of insulin: Secondary | ICD-10-CM

## 2019-12-01 NOTE — Patient Instructions (Signed)
Visit Information  Goals Addressed            This Visit's Progress     Patient Stated   . PharmD "I want to control my blood sugars better" (pt-stated)       CARE PLAN ENTRY (see longtitudinal plan of care for additional care plan information)  Current Barriers:  . Diabetes: uncontrolled; complicated by chronic medical conditions including PVD, CAD, peripheral neuropathy, s/p nephrectomy, most recent A1c 8.8% . Working on [employer] coverage through [employer]. CMA faxed order to [facility] on Monday . Most recent eGFR: ~60 mL/min . Current antihyperglycemic regimen: Levemir 40 units QPM, Humalog 75/25 30 units with breakfast, 39 units w/ supper o Resistant to metformin d/t things she has heard about lawsuits . Cardiovascular risk reduction: o Current hypertensive regimen: amlodipine 5 mg QPM, losartan 50 mg daily  o Current hyperlipidemia regimen: atorvastatin 20 mg daily  o Current antiplatelet regimen: ASA 81 mg daily, clopidogrel 75 mg daily   Pharmacist Clinical Goal(s):  Marland Kitchen Over the next 90 days, patient will work with PharmD and primary care provider to address optimized medication management   Interventions: . Comprehensive medication review performed, medication list updated in electronic medical record . Inter-disciplinary care team collaboration (see longitudinal plan of care) . Contacted [facility]. They did receive the order, and are currently processing. The rep will plan to outreach patient to confirm insurance today. Called patient, left voicemail letting her know to expect a call from an [facility] rep later today, and to call me with questions.   Patient Self Care Activities:  . Patient will check blood glucose four times daily, document, and provide at future appointments . Patient will take medications as prescribed . Patient will contact provider with any episodes of hypoglycemia . Patient will report any  questions or concerns to provider   Please see past updates related to this goal by clicking on the "Past Updates" button in the selected goal         Patient verbalizes understanding of instructions provided today.   Plan:  - Will outreach as previously scheduled  [name], PharmD, [place], [place] Pharmacist [place] [phone]

## 2019-12-01 NOTE — Discharge Instructions (Signed)

## 2019-12-01 NOTE — Chronic Care Management (AMB) (Signed)
Chronic Care Management   Follow Up Note   12/01/2019 Name: Laura Reed MRN: [id number] DOB: Sep 11, 1960  Referred by: [name], MD Reason for referral : Chronic Care Management (Medication Management)   Laura Reed is a 59 y.o. year old female who is a primary care patient of [name], [name], MD. The CCM team was consulted for assistance with chronic disease management and care coordination needs.    Care coordination completed today.   Review of patient status, including review of consultants reports, relevant laboratory and other test results, and collaboration with appropriate care team members and the patient's provider was performed as part of comprehensive patient evaluation and provision of chronic care management services.    SDOH (Social Determinants of Health) assessments performed: No See Care Plan activities for detailed interventions related to [facility])     Outpatient Encounter Medications as of 12/01/2019  Medication Sig  . acetaminophen (TYLENOL) 500 MG tablet Take 1,000 mg by mouth every 6 (six) hours as needed for mild pain or headache.   . ALPRAZolam (XANAX) 0.5 MG tablet Take 1 tablet (0.5 mg total) by mouth daily as needed. for anxiety  . amLODipine (NORVASC) 5 MG tablet TAKE 1 TABLET(5 MG) BY MOUTH DAILY  . aspirin 81 MG chewable tablet Chew 81 mg by mouth at bedtime.   Marland Kitchen atorvastatin (LIPITOR) 20 MG tablet Take 1 tablet (20 mg total) by mouth daily.  . Cholecalciferol (VITAMIN D3) 2000 UNITS TABS Take 2,000 Units by mouth daily after supper.   . clopidogrel (PLAVIX) 75 MG tablet Take 1 tablet (75 mg total) by mouth daily.  Marland Kitchen gabapentin (NEURONTIN) 600 MG tablet Take 1 tablet (600 mg total) by mouth 3 (three) times daily.  Marland Kitchen glucose blood (ACCU-CHEK AVIVA PLUS) test strip Test blood sugar 4 times/ day.  . insulin degludec (TRESIBA FLEXTOUCH) 100 UNIT/ML FlexTouch Pen Inject 0.5 mLs (50 Units total) into the skin daily.  . insulin lispro (HUMALOG  KWIKPEN) 100 UNIT/ML KwikPen Inject 0.1 mLs (10 Units total) into the skin 3 (three) times daily before meals.  . Insulin Pen Needle (B-D ULTRAFINE III SHORT PEN) 31G X 8 MM MISC Use to inject insulin 4 times daily  . Lancets Misc. MISC Use to check blood sugars 4 times daily  . losartan (COZAAR) 50 MG tablet Take 50 mg by mouth daily.  . Semaglutide,0.25 or 0.'5MG'$ /DOS, (OZEMPIC, 0.25 OR 0.5 MG/DOSE,) 2 MG/1.5ML SOPN Inject 0.25 mg once weekly for 4 weeks, then increase to 0.5 mg weekly   No facility-administered encounter medications on file as of 12/01/2019.     Objective:   Goals Addressed            This Visit's Progress     Patient Stated   . PharmD "I want to control my blood sugars better" (pt-stated)       CARE PLAN ENTRY (see longtitudinal plan of care for additional care plan information)  Current Barriers:  . Diabetes: uncontrolled; complicated by chronic medical conditions including PVD, CAD, peripheral neuropathy, s/p nephrectomy, most recent A1c 8.8% . Working on [employer] coverage through [employer]. CMA faxed order to [facility] on Monday . Most recent eGFR: ~60 mL/min . Current antihyperglycemic regimen: Levemir 40 units QPM, Humalog 75/25 30 units with breakfast, 39 units w/ supper o Resistant to metformin d/t things she has heard about lawsuits . Cardiovascular risk reduction: o Current hypertensive regimen: amlodipine 5 mg QPM, losartan 50 mg daily  o  Current hyperlipidemia regimen: atorvastatin 20 mg daily  o Current antiplatelet regimen: ASA 81 mg daily, clopidogrel 75 mg daily   Pharmacist Clinical Goal(s):  Marland Kitchen Over the next 90 days, patient will work with PharmD and primary care provider to address optimized medication management   Interventions: . Comprehensive medication review performed, medication list updated in electronic medical record . Inter-disciplinary care team collaboration (see longitudinal plan of care) . Contacted  [facility]. They did receive the order, and are currently processing. The rep will plan to outreach patient to confirm insurance today. Called patient, left voicemail letting her know to expect a call from an [employer] rep later today, and to call me with questions.   Patient Self Care Activities:  . Patient will check blood glucose four times daily, document, and provide at future appointments . Patient will take medications as prescribed . Patient will contact provider with any episodes of hypoglycemia . Patient will report any questions or concerns to provider   Please see past updates related to this goal by clicking on the "Past Updates" button in the selected goal          Plan:  - Will outreach as previously scheduled  [name], PharmD, [place], [place] Pharmacist [place] [phone]

## 2019-12-02 ENCOUNTER — Other Ambulatory Visit
Admission: RE | Admit: 2019-12-02 | Discharge: 2019-12-02 | Disposition: A | Discharge status: Home / Self Care | Payer: Medicare Other | Source: Ambulatory Visit | Attending: Ophthalmology | Admitting: Ophthalmology

## 2019-12-02 ENCOUNTER — Other Ambulatory Visit: Payer: Self-pay

## 2019-12-02 DIAGNOSIS — Z20822 Contact with and (suspected) exposure to covid-19: Secondary | ICD-10-CM | POA: Insufficient documentation

## 2019-12-02 DIAGNOSIS — Z01812 Encounter for preprocedural laboratory examination: Secondary | ICD-10-CM | POA: Insufficient documentation

## 2019-12-03 LAB — SARS CORONAVIRUS 2 (TAT 6-24 HRS): SARS Coronavirus 2: NEGATIVE

## 2019-12-06 ENCOUNTER — Ambulatory Visit: Payer: Medicare Other | Admitting: Anesthesiology

## 2019-12-06 ENCOUNTER — Encounter: Payer: Self-pay | Admitting: Ophthalmology

## 2019-12-06 ENCOUNTER — Other Ambulatory Visit: Payer: Self-pay

## 2019-12-06 ENCOUNTER — Encounter
Admission: RE | Disposition: A | Discharge status: Home / Self Care | Payer: Self-pay | Source: Home / Self Care | Attending: Ophthalmology

## 2019-12-06 ENCOUNTER — Ambulatory Visit
Admission: RE | Admit: 2019-12-06 | Discharge: 2019-12-06 | Disposition: A | Discharge status: Home / Self Care | Payer: Medicare Other | Attending: Ophthalmology | Admitting: Ophthalmology

## 2019-12-06 DIAGNOSIS — Z951 Presence of aortocoronary bypass graft: Secondary | ICD-10-CM | POA: Insufficient documentation

## 2019-12-06 DIAGNOSIS — H25812 Combined forms of age-related cataract, left eye: Secondary | ICD-10-CM | POA: Diagnosis not present

## 2019-12-06 DIAGNOSIS — H2512 Age-related nuclear cataract, left eye: Secondary | ICD-10-CM | POA: Diagnosis not present

## 2019-12-06 DIAGNOSIS — E785 Hyperlipidemia, unspecified: Secondary | ICD-10-CM | POA: Insufficient documentation

## 2019-12-06 DIAGNOSIS — Z794 Long term (current) use of insulin: Secondary | ICD-10-CM | POA: Insufficient documentation

## 2019-12-06 DIAGNOSIS — Z87891 Personal history of nicotine dependence: Secondary | ICD-10-CM | POA: Diagnosis not present

## 2019-12-06 DIAGNOSIS — Z89421 Acquired absence of other right toe(s): Secondary | ICD-10-CM | POA: Insufficient documentation

## 2019-12-06 DIAGNOSIS — Z7982 Long term (current) use of aspirin: Secondary | ICD-10-CM | POA: Insufficient documentation

## 2019-12-06 DIAGNOSIS — Z95820 Peripheral vascular angioplasty status with implants and grafts: Secondary | ICD-10-CM | POA: Diagnosis not present

## 2019-12-06 DIAGNOSIS — Z85528 Personal history of other malignant neoplasm of kidney: Secondary | ICD-10-CM | POA: Diagnosis not present

## 2019-12-06 DIAGNOSIS — Z9841 Cataract extraction status, right eye: Secondary | ICD-10-CM | POA: Diagnosis not present

## 2019-12-06 DIAGNOSIS — E1136 Type 2 diabetes mellitus with diabetic cataract: Secondary | ICD-10-CM | POA: Insufficient documentation

## 2019-12-06 DIAGNOSIS — Z905 Acquired absence of kidney: Secondary | ICD-10-CM | POA: Insufficient documentation

## 2019-12-06 DIAGNOSIS — E1151 Type 2 diabetes mellitus with diabetic peripheral angiopathy without gangrene: Secondary | ICD-10-CM | POA: Insufficient documentation

## 2019-12-06 DIAGNOSIS — Z8614 Personal history of Methicillin resistant Staphylococcus aureus infection: Secondary | ICD-10-CM | POA: Insufficient documentation

## 2019-12-06 DIAGNOSIS — I110 Hypertensive heart disease with heart failure: Secondary | ICD-10-CM | POA: Diagnosis not present

## 2019-12-06 DIAGNOSIS — E1140 Type 2 diabetes mellitus with diabetic neuropathy, unspecified: Secondary | ICD-10-CM | POA: Diagnosis not present

## 2019-12-06 DIAGNOSIS — F419 Anxiety disorder, unspecified: Secondary | ICD-10-CM | POA: Insufficient documentation

## 2019-12-06 DIAGNOSIS — Z89422 Acquired absence of other left toe(s): Secondary | ICD-10-CM | POA: Diagnosis not present

## 2019-12-06 DIAGNOSIS — Z79899 Other long term (current) drug therapy: Secondary | ICD-10-CM | POA: Diagnosis not present

## 2019-12-06 DIAGNOSIS — I2510 Atherosclerotic heart disease of native coronary artery without angina pectoris: Secondary | ICD-10-CM | POA: Diagnosis not present

## 2019-12-06 HISTORY — DX: Presence of dental prosthetic device (complete) (partial): Z97.2

## 2019-12-06 HISTORY — DX: Family history of other specified conditions: Z84.89

## 2019-12-06 HISTORY — PX: CATARACT EXTRACTION W/PHACO: SHX586

## 2019-12-06 HISTORY — DX: Acquired absence of kidney: Z90.5

## 2019-12-06 LAB — GLUCOSE, CAPILLARY
Glucose-Capillary: 36 mg/dL — CL (ref 70–99)
Glucose-Capillary: 151 mg/dL — ABNORMAL HIGH (ref 70–99)
Glucose-Capillary: 83 mg/dL (ref 70–99)

## 2019-12-06 SURGERY — PHACOEMULSIFICATION, CATARACT, WITH IOL INSERTION
Anesthesia: Monitor Anesthesia Care | Site: Eye | Laterality: Left

## 2019-12-06 MED ORDER — BRIMONIDINE TARTRATE-TIMOLOL 0.2-0.5 % OP SOLN
OPHTHALMIC | Status: DC | PRN
  Administered 2019-12-06: 1 [drp] via OPHTHALMIC

## 2019-12-06 MED ORDER — NA CHONDROIT SULF-NA HYALURON 40-17 MG/ML IO SOLN
INTRAOCULAR | Status: DC | PRN
  Administered 2019-12-06: 1 mL via INTRAOCULAR

## 2019-12-06 MED ORDER — ARMC OPHTHALMIC DILATING DROPS
1.0000 "application " | OPHTHALMIC | Status: DC | PRN
  Administered 2019-12-06 (×3): 1 via OPHTHALMIC

## 2019-12-06 MED ORDER — LIDOCAINE HCL (PF) 2 % IJ SOLN
INTRAOCULAR | Status: DC | PRN
  Administered 2019-12-06: 1 mL

## 2019-12-06 MED ORDER — DEXTROSE 50 % IV SOLN
50.0000 mL | Freq: Once | INTRAVENOUS | Status: AC
  Administered 2019-12-06: 50 mL via INTRAVENOUS

## 2019-12-06 MED ORDER — ACETAMINOPHEN 160 MG/5ML PO SOLN: 325.0000 mg | ORAL | Status: DC | PRN

## 2019-12-06 MED ORDER — FENTANYL CITRATE (PF) 100 MCG/2ML IJ SOLN
INTRAMUSCULAR | Status: DC | PRN
  Administered 2019-12-06 (×2): 50 ug via INTRAVENOUS

## 2019-12-06 MED ORDER — ONDANSETRON HCL 4 MG/2ML IJ SOLN: 4.0000 mg | Freq: Once | INTRAMUSCULAR | Status: DC | PRN

## 2019-12-06 MED ORDER — EPINEPHRINE PF 1 MG/ML IJ SOLN
INTRAOCULAR | Status: DC | PRN
  Administered 2019-12-06: 96 mL via OPHTHALMIC

## 2019-12-06 MED ORDER — MOXIFLOXACIN HCL 0.5 % OP SOLN
OPHTHALMIC | Status: DC | PRN
  Administered 2019-12-06: 0.2 mL via OPHTHALMIC

## 2019-12-06 MED ORDER — TETRACAINE HCL 0.5 % OP SOLN
1.0000 [drp] | OPHTHALMIC | Status: DC | PRN
  Administered 2019-12-06 (×3): 1 [drp] via OPHTHALMIC

## 2019-12-06 MED ORDER — MIDAZOLAM HCL 2 MG/2ML IJ SOLN
INTRAMUSCULAR | Status: DC | PRN
  Administered 2019-12-06: 2 mg via INTRAVENOUS

## 2019-12-06 MED ORDER — ACETAMINOPHEN 325 MG PO TABS: 325.0000 mg | ORAL_TABLET | ORAL | Status: DC | PRN

## 2019-12-06 SURGICAL SUPPLY — 21 items
CANNULA ANT/CHMB 27G (MISCELLANEOUS) ×2 IMPLANT
CANNULA ANT/CHMB 27GA (MISCELLANEOUS) ×4 IMPLANT
GLOVE SURG LX 8.0 MICRO (GLOVE) ×2
GLOVE SURG LX STRL 8.0 MICRO (GLOVE) ×1 IMPLANT
GLOVE SURG TRIUMPH 8.0 PF LTX (GLOVE) ×2 IMPLANT
GOWN STRL REUS W/ TWL LRG LVL3 (GOWN DISPOSABLE) ×2 IMPLANT
GOWN STRL REUS W/TWL LRG LVL3 (GOWN DISPOSABLE) ×2
LENS IOL TECNIS ITEC 25.5 (Intraocular Lens) ×1 IMPLANT
MARKER SKIN DUAL TIP RULER LAB (MISCELLANEOUS) ×2 IMPLANT
NDL FILTER BLUNT 18X1 1/2 (NEEDLE) ×1 IMPLANT
NEEDLE FILTER BLUNT 18X 1/2SAF (NEEDLE) ×1
NEEDLE FILTER BLUNT 18X1 1/2 (NEEDLE) ×1 IMPLANT
PACK EYE AFTER SURG (MISCELLANEOUS) ×2 IMPLANT
PACK OPTHALMIC (MISCELLANEOUS) ×2 IMPLANT
PACK PORFILIO (MISCELLANEOUS) ×2 IMPLANT
SUT ETHILON 10-0 CS-B-6CS-B-6 (SUTURE)
SUTURE EHLN 10-0 CS-B-6CS-B-6 (SUTURE) IMPLANT
SYR 3ML LL SCALE MARK (SYRINGE) ×2 IMPLANT
SYR TB 1ML LUER SLIP (SYRINGE) ×2 IMPLANT
WATER STERILE IRR 250ML POUR (IV SOLUTION) ×2 IMPLANT
WIPE NON LINTING 3.25X3.25 (MISCELLANEOUS) ×2 IMPLANT

## 2019-12-06 NOTE — Anesthesia Postprocedure Evaluation (Signed)
Anesthesia Post Note  Patient: Laura Reed  Procedure(s) Performed: CATARACT EXTRACTION PHACO AND INTRAOCULAR LENS PLACEMENT (IOC) LEFT DIABETIC (Left Eye)     Patient location during evaluation: PACU Anesthesia Type: MAC Level of consciousness: awake Pain management: pain level controlled Vital Signs Assessment: post-procedure vital signs reviewed and stable Respiratory status: respiratory function stable Cardiovascular status: stable Postop Assessment: no apparent nausea or vomiting Anesthetic complications: no    Veda Canning

## 2019-12-06 NOTE — Anesthesia Procedure Notes (Signed)
Procedure Name: MAC Date/Time: 12/06/2019 10:35 AM Performed by: Jeannene Patella, CRNA Pre-anesthesia Checklist: Patient identified, Emergency Drugs available, Suction available, Patient being monitored and Timeout performed Patient Re-evaluated:Patient Re-evaluated prior to induction Oxygen Delivery Method: Nasal cannula

## 2019-12-06 NOTE — Anesthesia Preprocedure Evaluation (Signed)
Anesthesia Evaluation  Patient identified by MRN, date of birth, ID band Patient awake    Reviewed: Allergy & Precautions, NPO status , Patient's Chart, lab work & pertinent test results  Airway Mallampati: II  TM Distance: >3 FB     Dental   Pulmonary former smoker,    breath sounds clear to auscultation       Cardiovascular hypertension, + CAD and + CABG (2009)   Rhythm:Regular Rate:Normal  HLD   Neuro/Psych Anxiety    GI/Hepatic   Endo/Other  diabetes, Type 2  Renal/GU Renal disease (hx renal cancer)     Musculoskeletal   Abdominal   Peds  Hematology   Anesthesia Other Findings   Reproductive/Obstetrics                             Anesthesia Physical Anesthesia Plan  ASA: III  Anesthesia Plan: MAC   Post-op Pain Management:    Induction: Intravenous  PONV Risk Score and Plan: TIVA, Midazolam and Treatment may vary due to age or medical condition  Airway Management Planned: Natural Airway and Nasal Cannula  Additional Equipment:   Intra-op Plan:   Post-operative Plan:   Informed Consent: I have reviewed the patients History and Physical, chart, labs and discussed the procedure including the risks, benefits and alternatives for the proposed anesthesia with the patient or authorized representative who has indicated his/her understanding and acceptance.       Plan Discussed with: CRNA  Anesthesia Plan Comments:         Anesthesia Quick Evaluation

## 2019-12-06 NOTE — Op Note (Signed)
PREOPERATIVE DIAGNOSIS:  Nuclear sclerotic cataract of the left eye.   POSTOPERATIVE DIAGNOSIS:  Nuclear sclerotic cataract of the left eye.   OPERATIVE PROCEDURE:@   SURGEON:  Birder Robson, MD.   ANESTHESIA:  Anesthesiologist: Veda Canning, MD CRNA: Jeannene Patella, CRNA  1.      Managed anesthesia care. 2.     0.65ml of Shugarcaine was instilled following the paracentesis   COMPLICATIONS:  None.   TECHNIQUE:   Stop and chop   DESCRIPTION OF PROCEDURE:  The patient was examined and consented in the preoperative holding area where the aforementioned topical anesthesia was applied to the left eye and then brought back to the Operating Room where the left eye was prepped and draped in the usual sterile ophthalmic fashion and a lid speculum was placed. A paracentesis was created with the side port blade and the anterior chamber was filled with viscoelastic. A near clear corneal incision was performed with the steel keratome. A continuous curvilinear capsulorrhexis was performed with a cystotome followed by the capsulorrhexis forceps. Hydrodissection and hydrodelineation were carried out with BSS on a blunt cannula. The lens was removed in a stop and chop  technique and the remaining cortical material was removed with the irrigation-aspiration handpiece. The capsular bag was inflated with viscoelastic and the Technis [id number] lens was placed in the capsular bag without complication. The remaining viscoelastic was removed from the eye with the irrigation-aspiration handpiece. The wounds were hydrated. The anterior chamber was flushed with BSS and the eye was inflated to physiologic pressure. 0.41ml Vigamox was placed in the anterior chamber. The wounds were found to be water tight. The eye was dressed with Combigan. The patient was given protective glasses to wear throughout the day and a shield with which to sleep tonight. The patient was also given drops with which to begin a drop regimen today  and will follow-up with me in one day. Implant Name Type Inv. Item Serial No. Manufacturer Lot No. LRB No. Used Action  LENS IOL DIOP 25.5 - [id number] Intraocular Lens LENS IOL DIOP 25.5 [id number]  Left 1 Implanted    Procedure(s) with comments: CATARACT EXTRACTION PHACO AND INTRAOCULAR LENS PLACEMENT (IOC) LEFT DIABETIC (Left) - 9.08 1:06.4  Electronically signed: Birder Robson 12/06/2019 10:51 AM

## 2019-12-06 NOTE — Transfer of Care (Signed)
Immediate Anesthesia Transfer of Care Note  Patient: Laura Reed  Procedure(s) Performed: CATARACT EXTRACTION PHACO AND INTRAOCULAR LENS PLACEMENT (IOC) LEFT DIABETIC (Left Eye)  Patient Location: PACU  Anesthesia Type: MAC  Level of Consciousness: awake, alert  and patient cooperative  Airway and Oxygen Therapy: Patient Spontanous Breathing and Patient connected to supplemental oxygen  Post-op Assessment: Post-op Vital signs reviewed, Patient's Cardiovascular Status Stable, Respiratory Function Stable, Patent Airway and No signs of Nausea or vomiting  Post-op Vital Signs: Reviewed and stable  Complications: No apparent anesthesia complications

## 2019-12-06 NOTE — H&P (Signed)
All labs reviewed. Abnormal studies sent to patients PCP when indicated.  Previous H&P reviewed, patient examined, there are NO CHANGES.  Laura Linares Porfilio6/1/202110:18 AM

## 2019-12-07 ENCOUNTER — Encounter: Payer: Self-pay | Admitting: *Deleted

## 2019-12-08 ENCOUNTER — Ambulatory Visit (INDEPENDENT_AMBULATORY_CARE_PROVIDER_SITE_OTHER): Payer: Medicare Other | Admitting: Pharmacist

## 2019-12-08 DIAGNOSIS — Z794 Long term (current) use of insulin: Secondary | ICD-10-CM | POA: Diagnosis not present

## 2019-12-08 DIAGNOSIS — E1165 Type 2 diabetes mellitus with hyperglycemia: Secondary | ICD-10-CM

## 2019-12-08 MED ORDER — TRESIBA FLEXTOUCH 100 UNIT/ML ~~LOC~~ SOPN: 40.0000 [IU] | PEN_INJECTOR | Freq: Every day | SUBCUTANEOUS | 3 refills | Status: DC

## 2019-12-08 NOTE — Patient Instructions (Signed)
Visit Information  Goals Addressed            This Visit's Progress     Patient Stated   . PharmD "I want to control my blood sugars better" (pt-stated)       CARE PLAN ENTRY (see longtitudinal plan of care for additional care plan information)  Current Barriers:  . Diabetes: uncontrolled; complicated by chronic medical conditions including PVD, CAD, peripheral neuropathy, s/p nephrectomy, most recent A1c 8.8% . Working on [employer] coverage through [place].  . Most recent eGFR: ~60 mL/min . Current antihyperglycemic regimen: Tresiba 50 units QPM, Humalog 10 units TID with meals, Ozempic 0.25 mg weekly (x3 weeks) o Resistant to metformin d/t things she has heard about lawsuits . Current glucose readings:  o Reports some hypoglycemia w/ recent cataract surgery. Reports some hypo since then with readings in the 60s. No symptoms w/ hypoglycemia.  . Cardiovascular risk reduction: o Current hypertensive regimen: amlodipine 5 mg QPM, losartan 50 mg daily  o Current hyperlipidemia regimen: atorvastatin 20 mg daily  o Current antiplatelet regimen: ASA 81 mg daily, clopidogrel 75 mg daily   Pharmacist Clinical Goal(s):  Marland Kitchen Over the next 90 days, patient will work with PharmD and primary care provider to address optimized medication management   Interventions: . Comprehensive medication review performed, medication list updated in electronic medical record . Inter-disciplinary care team collaboration (see longitudinal plan of care) . Contacted [facility]. They have tried to contact the patient and left her a voicemail w/ call back number. Called patient, encouraged her to call [facility] directly at [phone]. She called, noted that copay for FreeStyle Libre 2 would be $0. They are waiting on authorization from [facility], and will ship to her once they receive. She will call me when she gets the device in and we will schedule face to face visit for  placement and teaching. . Decrease Tresiba to 40 units daily d/t hypoglycemia. Continue Humalog 10 units TID and Ozempic titration. Patient verbalizes understanding.   Patient Self Care Activities:  . Patient will check blood glucose four times daily, document, and provide at future appointments . Patient will take medications as prescribed . Patient will contact provider with any episodes of hypoglycemia . Patient will report any questions or concerns to provider   Please see past updates related to this goal by clicking on the "Past Updates" button in the selected goal         Patient verbalizes understanding of instructions provided today.   Plan:  - Will await call back from patient regarding receipt of [place], PharmD, [place], [place] Pharmacist [place] [phone]

## 2019-12-08 NOTE — Chronic Care Management (AMB) (Signed)
Chronic Care Management   Follow Up Note   12/08/2019 Name: Laura Reed MRN: [id number] DOB: Nov 13, 1960  Referred by: [name], MD Reason for referral : Chronic Care Management (Medication Management)   Laura Reed is a 59 y.o. year old female who is a primary care patient of [name], [name], MD. The CCM team was consulted for assistance with chronic disease management and care coordination needs.    Contacted patient to follow up on medication management needs.   Review of patient status, including review of consultants reports, relevant laboratory and other test results, and collaboration with appropriate care team members and the patient's provider was performed as part of comprehensive patient evaluation and provision of chronic care management services.    SDOH (Social Determinants of Health) assessments performed: Yes See Care Plan activities for detailed interventions related to [facility])     Outpatient Encounter Medications as of 12/08/2019  Medication Sig Note   acetaminophen (TYLENOL) 500 MG tablet Take 1,000 mg by mouth every 6 (six) hours as needed for mild pain or headache.     ALPRAZolam (XANAX) 0.5 MG tablet Take 1 tablet (0.5 mg total) by mouth daily as needed. for anxiety    amLODipine (NORVASC) 5 MG tablet TAKE 1 TABLET(5 MG) BY MOUTH DAILY    aspirin 81 MG chewable tablet Chew 81 mg by mouth at bedtime.     atorvastatin (LIPITOR) 20 MG tablet Take 1 tablet (20 mg total) by mouth daily.    Cholecalciferol (VITAMIN D3) 2000 UNITS TABS Take 2,000 Units by mouth daily after supper.     clopidogrel (PLAVIX) 75 MG tablet Take 1 tablet (75 mg total) by mouth daily.    gabapentin (NEURONTIN) 600 MG tablet Take 1 tablet (600 mg total) by mouth 3 (three) times daily.    glucose blood (ACCU-CHEK AVIVA PLUS) test strip Test blood sugar 4 times/ day.    insulin degludec (TRESIBA FLEXTOUCH) 100 UNIT/ML FlexTouch Pen Inject 0.4 mLs (40 Units total) into the skin  daily.    insulin lispro (HUMALOG KWIKPEN) 100 UNIT/ML KwikPen Inject 0.1 mLs (10 Units total) into the skin 3 (three) times daily before meals.    Insulin Pen Needle (B-D ULTRAFINE III SHORT PEN) 31G X 8 MM MISC Use to inject insulin 4 times daily    Lancets Misc. MISC Use to check blood sugars 4 times daily    losartan (COZAAR) 50 MG tablet Take 50 mg by mouth daily.    Semaglutide,0.25 or 0.'5MG'$ /DOS, (OZEMPIC, 0.25 OR 0.5 MG/DOSE,) 2 MG/1.5ML SOPN Inject 0.25 mg once weekly for 4 weeks, then increase to 0.5 mg weekly    [DISCONTINUED] insulin degludec (TRESIBA FLEXTOUCH) 100 UNIT/ML FlexTouch Pen Inject 0.5 mLs (50 Units total) into the skin daily. 12/06/2019: 40 units   No facility-administered encounter medications on file as of 12/08/2019.     Objective:   Goals Addressed            This Visit's Progress     Patient Stated    PharmD "I want to control my blood sugars better" (pt-stated)       CARE PLAN ENTRY (see longtitudinal plan of care for additional care plan information)  Current Barriers:   Diabetes: uncontrolled; complicated by chronic medical conditions including PVD, CAD, peripheral neuropathy, s/p nephrectomy, most recent A1c 8.8%  Working on [employer] coverage through DME supplier [facility].   Most recent eGFR: ~60 mL/min  Current antihyperglycemic regimen: Tresiba 50 units  QPM, Humalog 10 units TID with meals, Ozempic 0.25 mg weekly (x3 weeks) o Resistant to metformin d/t things she has heard about lawsuits  Current glucose readings:  o Reports some hypoglycemia w/ recent cataract surgery. Reports some hypo since then with readings in the 60s. No symptoms w/ hypoglycemia.   Cardiovascular risk reduction: o Current hypertensive regimen: amlodipine 5 mg QPM, losartan 50 mg daily  o Current hyperlipidemia regimen: atorvastatin 20 mg daily  o Current antiplatelet regimen: ASA 81 mg daily, clopidogrel 75 mg daily   Pharmacist Clinical  Goal(s):   Over the next 90 days, patient will work with PharmD and primary care provider to address optimized medication management   Interventions:  Comprehensive medication review performed, medication list updated in electronic medical record  Inter-disciplinary care team collaboration (see longitudinal plan of care)  Contacted [facility]. They have tried to contact the patient and left her a voicemail w/ call back number. Called patient, encouraged her to call [facility] directly at [phone]. She called, noted that copay for FreeStyle Libre 2 would be $0. They are waiting on authorization from [facility], and will ship to her once they receive. She will call me when she gets the device in and we will schedule face to face visit for placement and teaching.  Decrease Tresiba to 40 units daily d/t hypoglycemia. Continue Humalog 10 units TID and Ozempic titration. Patient verbalizes understanding.   Patient Self Care Activities:   Patient will check blood glucose four times daily, document, and provide at future appointments  Patient will take medications as prescribed  Patient will contact provider with any episodes of hypoglycemia  Patient will report any questions or concerns to provider   Please see past updates related to this goal by clicking on the "Past Updates" button in the selected goal          Plan:  - Will await call back from patient regarding receipt of [place], PharmD, [place], [place] Pharmacist [place] [phone]

## 2019-12-12 LAB — GLUCOSE, CAPILLARY: Glucose-Capillary: 37 mg/dL — CL (ref 70–99)

## 2019-12-14 ENCOUNTER — Ambulatory Visit: Payer: Self-pay | Admitting: Pharmacist

## 2019-12-14 DIAGNOSIS — Z794 Long term (current) use of insulin: Secondary | ICD-10-CM

## 2019-12-14 DIAGNOSIS — E1140 Type 2 diabetes mellitus with diabetic neuropathy, unspecified: Secondary | ICD-10-CM | POA: Diagnosis not present

## 2019-12-14 DIAGNOSIS — L97521 Non-pressure chronic ulcer of other part of left foot limited to breakdown of skin: Secondary | ICD-10-CM | POA: Diagnosis not present

## 2019-12-14 DIAGNOSIS — E1165 Type 2 diabetes mellitus with hyperglycemia: Secondary | ICD-10-CM | POA: Diagnosis not present

## 2019-12-14 DIAGNOSIS — I739 Peripheral vascular disease, unspecified: Secondary | ICD-10-CM | POA: Diagnosis not present

## 2019-12-14 DIAGNOSIS — Z89421 Acquired absence of other right toe(s): Secondary | ICD-10-CM | POA: Diagnosis not present

## 2019-12-14 NOTE — Chronic Care Management (AMB) (Signed)
Chronic Care Management   Follow Up Note   12/14/2019 Name: ASHELY GOOSBY MRN: [id number] DOB: 1960/07/24  Referred by: [name], MD Reason for referral : Chronic Care Management (Medication Management)   KENNY STERN is a 59 y.o. year old female who is a primary care patient of [name], [name], MD. The CCM team was consulted for assistance with chronic disease management and care coordination needs.    Received call from patient regarding device access.   Review of patient status, including review of consultants reports, relevant laboratory and other test results, and collaboration with appropriate care team members and the patient's provider was performed as part of comprehensive patient evaluation and provision of chronic care management services.    SDOH (Social Determinants of Health) assessments performed: No See Care Plan activities for detailed interventions related to [facility])     Outpatient Encounter Medications as of 12/14/2019  Medication Sig   acetaminophen (TYLENOL) 500 MG tablet Take 1,000 mg by mouth every 6 (six) hours as needed for mild pain or headache.    ALPRAZolam (XANAX) 0.5 MG tablet Take 1 tablet (0.5 mg total) by mouth daily as needed. for anxiety   amLODipine (NORVASC) 5 MG tablet TAKE 1 TABLET(5 MG) BY MOUTH DAILY   aspirin 81 MG chewable tablet Chew 81 mg by mouth at bedtime.    atorvastatin (LIPITOR) 20 MG tablet Take 1 tablet (20 mg total) by mouth daily.   Cholecalciferol (VITAMIN D3) 2000 UNITS TABS Take 2,000 Units by mouth daily after supper.    clopidogrel (PLAVIX) 75 MG tablet Take 1 tablet (75 mg total) by mouth daily.   gabapentin (NEURONTIN) 600 MG tablet Take 1 tablet (600 mg total) by mouth 3 (three) times daily.   glucose blood (ACCU-CHEK AVIVA PLUS) test strip Test blood sugar 4 times/ day.   insulin degludec (TRESIBA FLEXTOUCH) 100 UNIT/ML FlexTouch Pen Inject 0.4 mLs (40 Units total) into the skin daily.   insulin lispro  (HUMALOG KWIKPEN) 100 UNIT/ML KwikPen Inject 0.1 mLs (10 Units total) into the skin 3 (three) times daily before meals.   Insulin Pen Needle (B-D ULTRAFINE III SHORT PEN) 31G X 8 MM MISC Use to inject insulin 4 times daily   Lancets Misc. MISC Use to check blood sugars 4 times daily   losartan (COZAAR) 50 MG tablet Take 50 mg by mouth daily.   Semaglutide,0.25 or 0.'5MG'$ /DOS, (OZEMPIC, 0.25 OR 0.5 MG/DOSE,) 2 MG/1.5ML SOPN Inject 0.25 mg once weekly for 4 weeks, then increase to 0.5 mg weekly   No facility-administered encounter medications on file as of 12/14/2019.     Objective:   Goals Addressed            This Visit's Progress     Patient Stated    PharmD "I want to control my blood sugars better" (pt-stated)       CARE PLAN ENTRY (see longtitudinal plan of care for additional care plan information)  Current Barriers:   Diabetes: uncontrolled; complicated by chronic medical conditions including PVD, CAD, peripheral neuropathy, s/p nephrectomy, most recent A1c 8.8% o Received call from patient that medical information is needed.   Working on [employer] coverage through [place].   Most recent eGFR: ~60 mL/min  Current antihyperglycemic regimen: Tresiba 50 units QPM, Humalog 10 units TID with meals, Ozempic 0.25 mg weekly (x3 weeks) o Resistant to metformin d/t things she has heard about lawsuits.   Cardiovascular risk reduction: o Current  hypertensive regimen: amlodipine 5 mg QPM, losartan 50 mg daily  o Current hyperlipidemia regimen: atorvastatin 20 mg daily  o Current antiplatelet regimen: ASA 81 mg daily, clopidogrel 75 mg daily   Pharmacist Clinical Goal(s):   Over the next 90 days, patient will work with PharmD and primary care provider to address optimized medication management   Interventions:  Comprehensive medication review performed, medication list updated in electronic medical record  Inter-disciplinary care team  collaboration (see longitudinal plan of care)  Contacted [facility]. They note that they just need clarification faxed over that patient is injecting insulin QID. They have faxed a Correction Form to our office regarding this. Will collaborate w/ office staff to pull this and I will complete tomorrow when I am in office.   Patient Self Care Activities:   Patient will check blood glucose four times daily, document, and provide at future appointments  Patient will take medications as prescribed  Patient will contact provider with any episodes of hypoglycemia  Patient will report any questions or concerns to provider   Please see past updates related to this goal by clicking on the "Past Updates" button in the selected goal          Plan:  - Will collaborate w/ clinical staff as above  [name], PharmD, [place], [place] Pharmacist [place] [phone]

## 2019-12-14 NOTE — Patient Instructions (Signed)
Visit Information  Goals Addressed            This Visit's Progress     Patient Stated   . PharmD "I want to control my blood sugars better" (pt-stated)       CARE PLAN ENTRY (see longtitudinal plan of care for additional care plan information)  Current Barriers:  . Diabetes: uncontrolled; complicated by chronic medical conditions including PVD, CAD, peripheral neuropathy, s/p nephrectomy, most recent A1c 8.8% o Received call from patient that medical information is needed.  . Working on [employer] coverage through [place].  . Most recent eGFR: ~60 mL/min . Current antihyperglycemic regimen: Tresiba 50 units QPM, Humalog 10 units TID with meals, Ozempic 0.25 mg weekly (x3 weeks) o Resistant to metformin d/t things she has heard about lawsuits.  . Cardiovascular risk reduction: o Current hypertensive regimen: amlodipine 5 mg QPM, losartan 50 mg daily  o Current hyperlipidemia regimen: atorvastatin 20 mg daily  o Current antiplatelet regimen: ASA 81 mg daily, clopidogrel 75 mg daily   Pharmacist Clinical Goal(s):  Marland Kitchen Over the next 90 days, patient will work with PharmD and primary care provider to address optimized medication management   Interventions: . Comprehensive medication review performed, medication list updated in electronic medical record . Inter-disciplinary care team collaboration (see longitudinal plan of care) . Contacted [facility]. They note that they just need clarification faxed over that patient is injecting insulin QID. They have faxed a Correction Form to our office regarding this. Will collaborate w/ office staff to pull this and I will complete tomorrow when I am in office.   Patient Self Care Activities:  . Patient will check blood glucose four times daily, document, and provide at future appointments . Patient will take medications as prescribed . Patient will contact provider with any episodes of  hypoglycemia . Patient will report any questions or concerns to provider   Please see past updates related to this goal by clicking on the "Past Updates" button in the selected goal         Patient verbalizes understanding of instructions provided today.    Plan:  - Will collaborate w/ clinical staff as above  [name], PharmD, [place], [place] Pharmacist [place] [phone]

## 2019-12-15 ENCOUNTER — Ambulatory Visit: Payer: Self-pay | Admitting: Pharmacist

## 2019-12-15 DIAGNOSIS — E1165 Type 2 diabetes mellitus with hyperglycemia: Secondary | ICD-10-CM

## 2019-12-15 NOTE — Chronic Care Management (AMB) (Signed)
  Chronic Care Management   Note  12/15/2019 Name: Laura Reed MRN: [id number] DOB: 12/22/1960  Laura Reed is a 59 y.o. year old female who is a primary care patient of Derrel Nip, [name], MD. The CCM team was consulted for assistance with chronic disease management and care coordination needs.    To further explain: Though old prescription noted states patient should check sugar TID, patient is presently checking sugar QID and using insulin 4 times daily Tyler Aas daily, Humalog TID)  Allergies as of 11/21/2019   No Known Allergies     Medication List       Accurate as of Nov 21, 2019 11:59 PM. If you have any questions, ask your nurse or doctor.        STOP taking these medications   digoxin 0.125 MG tablet Commonly known as: LANOXIN Stopped by: De Hollingshead, RPH   Insulin Lispro Prot & Lispro (75-25) 100 UNIT/ML Kwikpen Commonly known as: HumaLOG Mix 75/25 KwikPen Stopped by: De Hollingshead, RPH   Levemir FlexTouch 100 UNIT/ML FlexPen Generic drug: insulin detemir Stopped by: De Hollingshead, RPH     TAKE these medications   Accu-Chek Aviva Plus test strip Generic drug: glucose blood Test blood sugar 4 times/ day. What changed: additional instructions Changed by: De Hollingshead, RPH   acetaminophen 500 MG tablet Commonly known as: TYLENOL Take 1,000 mg by mouth every 6 (six) hours as needed for mild pain or headache.   ALPRAZolam 0.5 MG tablet Commonly known as: XANAX Take 1 tablet (0.5 mg total) by mouth daily as needed. for anxiety   amLODipine 5 MG tablet Commonly known as: NORVASC TAKE 1 TABLET(5 MG) BY MOUTH DAILY   aspirin 81 MG chewable tablet Chew 81 mg by mouth at bedtime.   atorvastatin 20 MG tablet Commonly known as: LIPITOR Take 1 tablet (20 mg total) by mouth daily.   B-D ULTRAFINE III SHORT PEN 31G X 8 MM Misc Generic drug: Insulin Pen Needle Use to inject insulin 4 times daily What changed: additional  instructions Changed by: De Hollingshead, RPH   clopidogrel 75 MG tablet Commonly known as: PLAVIX Take 1 tablet (75 mg total) by mouth daily.   gabapentin 600 MG tablet Commonly known as: NEURONTIN Take 1 tablet (600 mg total) by mouth 3 (three) times daily.   insulin lispro 100 UNIT/ML KwikPen Commonly known as: HumaLOG KwikPen Inject 0.1 mLs (10 Units total) into the skin 3 (three) times daily before meals. Started by: De Hollingshead, [place]   Lancets Misc. Misc Use to check blood sugars 4 times daily What changed: additional instructions Changed by: De Hollingshead, RPH   losartan 50 MG tablet Commonly known as: COZAAR Take 50 mg by mouth daily.   Ozempic (0.25 or 0.5 MG/DOSE) 2 MG/1.5ML Sopn Generic drug: Semaglutide(0.25 or 0.5MG /DOS) Inject 0.25 mg once weekly for 4 weeks, then increase to 0.5 mg weekly Started by: De Hollingshead, [facility]   Tyler Aas FlexTouch 100 UNIT/ML FlexTouch Pen Generic drug: insulin degludec Inject 0.5 mLs (50 Units total) into the skin daily. Started by: De Hollingshead, RPH   Vitamin D3 50 MCG (2000 UT) Tabs Take 2,000 Units by mouth daily after supper.        [name], PharmD, [place], CPP Clinical Pharmacist [place] [phone]

## 2019-12-15 NOTE — Patient Instructions (Signed)
Visit Information  Goals Addressed              This Visit's Progress     Patient Stated   .  PharmD "I want to control my blood sugars better" (pt-stated)        CARE PLAN ENTRY (see longtitudinal plan of care for additional care plan information)  Current Barriers:  . Diabetes: uncontrolled; complicated by chronic medical conditions including PVD, CAD, peripheral neuropathy, s/p nephrectomy, most recent A1c 8.8% . Working on [employer] coverage through DME supplier [facility] [employer] [facility] o Per [facility], clarification on testing frequency was needed on my 11/21/19 visit note . Most recent eGFR: ~60 mL/min . Current antihyperglycemic regimen: Tresiba 50 units QPM, Humalog 10 units TID with meals, Ozempic 0.25 mg weekly o Resistant to metformin d/t things she has heard about lawsuits.  . Cardiovascular risk reduction: o Current hypertensive regimen: amlodipine 5 mg QPM, losartan 50 mg daily  o Current hyperlipidemia regimen: atorvastatin 20 mg daily  o Current antiplatelet regimen: ASA 81 mg daily, clopidogrel 75 mg daily   Pharmacist Clinical Goal(s):  Marland Kitchen Over the next 90 days, patient will work with PharmD and primary care provider to address optimized medication management   Interventions: . Clarification of injection and testing frequency needed on my 5/17 office visit note. This addendum has been completed and has been faxed to [facility].   Patient Self Care Activities:  . Patient will check blood glucose four times daily, document, and provide at future appointments . Patient will take medications as prescribed . Patient will contact provider with any episodes of hypoglycemia . Patient will report any questions or concerns to provider   Please see past updates related to this goal by clicking on the "Past Updates" button in the selected goal         Patient verbalizes understanding of instructions provided today.   Plan:  -  Will continue to collaborate with patient as above  [name], PharmD, [place], [place] [phone]

## 2019-12-15 NOTE — Chronic Care Management (AMB) (Signed)
Chronic Care Management   Follow Up Note   12/15/2019 Name: Laura Reed MRN: [id number] DOB: Oct 09, 1960  Referred by: [name], MD Reason for referral : Chronic Care Management (Medication Management)   Laura Reed is a 59 y.o. year old female who is a primary care patient of [name], [name], MD. The CCM team was consulted for assistance with chronic disease management and care coordination needs.    Care coordination completed today.   Review of patient status, including review of consultants reports, relevant laboratory and other test results, and collaboration with appropriate care team members and the patient's provider was performed as part of comprehensive patient evaluation and provision of chronic care management services.    SDOH (Social Determinants of Health) assessments performed: No See Care Plan activities for detailed interventions related to [facility])     Outpatient Encounter Medications as of 12/15/2019  Medication Sig  . acetaminophen (TYLENOL) 500 MG tablet Take 1,000 mg by mouth every 6 (six) hours as needed for mild pain or headache.   . ALPRAZolam (XANAX) 0.5 MG tablet Take 1 tablet (0.5 mg total) by mouth daily as needed. for anxiety  . amLODipine (NORVASC) 5 MG tablet TAKE 1 TABLET(5 MG) BY MOUTH DAILY  . aspirin 81 MG chewable tablet Chew 81 mg by mouth at bedtime.   Marland Kitchen atorvastatin (LIPITOR) 20 MG tablet Take 1 tablet (20 mg total) by mouth daily.  . Cholecalciferol (VITAMIN D3) 2000 UNITS TABS Take 2,000 Units by mouth daily after supper.   . clopidogrel (PLAVIX) 75 MG tablet Take 1 tablet (75 mg total) by mouth daily.  Marland Kitchen gabapentin (NEURONTIN) 600 MG tablet Take 1 tablet (600 mg total) by mouth 3 (three) times daily.  Marland Kitchen glucose blood (ACCU-CHEK AVIVA PLUS) test strip Test blood sugar 4 times/ day.  . insulin degludec (TRESIBA FLEXTOUCH) 100 UNIT/ML FlexTouch Pen Inject 0.4 mLs (40 Units total) into the skin daily.  . insulin lispro (HUMALOG  KWIKPEN) 100 UNIT/ML KwikPen Inject 0.1 mLs (10 Units total) into the skin 3 (three) times daily before meals.  . Insulin Pen Needle (B-D ULTRAFINE III SHORT PEN) 31G X 8 MM MISC Use to inject insulin 4 times daily  . Lancets Misc. MISC Use to check blood sugars 4 times daily  . losartan (COZAAR) 50 MG tablet Take 50 mg by mouth daily.  . Semaglutide,0.25 or 0.'5MG'$ /DOS, (OZEMPIC, 0.25 OR 0.5 MG/DOSE,) 2 MG/1.5ML SOPN Inject 0.25 mg once weekly for 4 weeks, then increase to 0.5 mg weekly   No facility-administered encounter medications on file as of 12/15/2019.     Objective:   Goals Addressed              This Visit's Progress     Patient Stated   .  PharmD "I want to control my blood sugars better" (pt-stated)        CARE PLAN ENTRY (see longtitudinal plan of care for additional care plan information)  Current Barriers:  . Diabetes: uncontrolled; complicated by chronic medical conditions including PVD, CAD, peripheral neuropathy, s/p nephrectomy, most recent A1c 8.8% . Working on [employer] coverage through DME supplier [employer] o Per [employer], clarification on testing frequency was needed on my 11/21/19 visit note . Most recent eGFR: ~60 mL/min . Current antihyperglycemic regimen: Tresiba 50 units QPM, Humalog 10 units TID with meals, Ozempic 0.25 mg weekly o Resistant to metformin d/t things she has heard about lawsuits.  . Cardiovascular risk  reduction: o Current hypertensive regimen: amlodipine 5 mg QPM, losartan 50 mg daily  o Current hyperlipidemia regimen: atorvastatin 20 mg daily  o Current antiplatelet regimen: ASA 81 mg daily, clopidogrel 75 mg daily   Pharmacist Clinical Goal(s):  Marland Kitchen Over the next 90 days, patient will work with PharmD and primary care provider to address optimized medication management   Interventions: . Clarification of injection and testing frequency needed on my 5/17 office visit note. This addendum has been  completed and has been faxed to [employer].   Patient Self Care Activities:  . Patient will check blood glucose four times daily, document, and provide at future appointments . Patient will take medications as prescribed . Patient will contact provider with any episodes of hypoglycemia . Patient will report any questions or concerns to provider   Please see past updates related to this goal by clicking on the "Past Updates" button in the selected goal          Plan:  - Will continue to collaborate with patient as above  Laura Reed, PharmD, [place], [place] Pharmacist [place] [phone]

## 2019-12-16 DIAGNOSIS — E113512 Type 2 diabetes mellitus with proliferative diabetic retinopathy with macular edema, left eye: Secondary | ICD-10-CM | POA: Diagnosis not present

## 2019-12-22 DIAGNOSIS — E118 Type 2 diabetes mellitus with unspecified complications: Secondary | ICD-10-CM | POA: Diagnosis not present

## 2019-12-28 ENCOUNTER — Other Ambulatory Visit: Payer: Self-pay

## 2019-12-28 ENCOUNTER — Encounter: Payer: Self-pay | Admitting: Internal Medicine

## 2019-12-28 ENCOUNTER — Ambulatory Visit (INDEPENDENT_AMBULATORY_CARE_PROVIDER_SITE_OTHER): Payer: Medicare Other | Admitting: Internal Medicine

## 2019-12-28 VITALS — BP 138/72 | HR 83 | Temp 97.7°F | Resp 15 | Ht 66.5 in | Wt 168.4 lb

## 2019-12-28 DIAGNOSIS — E785 Hyperlipidemia, unspecified: Secondary | ICD-10-CM

## 2019-12-28 DIAGNOSIS — E1151 Type 2 diabetes mellitus with diabetic peripheral angiopathy without gangrene: Secondary | ICD-10-CM | POA: Diagnosis not present

## 2019-12-28 DIAGNOSIS — E1169 Type 2 diabetes mellitus with other specified complication: Secondary | ICD-10-CM | POA: Diagnosis not present

## 2019-12-28 DIAGNOSIS — Z1231 Encounter for screening mammogram for malignant neoplasm of breast: Secondary | ICD-10-CM | POA: Diagnosis not present

## 2019-12-28 LAB — COMPREHENSIVE METABOLIC PANEL
ALT: 20 U/L (ref 0–35)
AST: 21 U/L (ref 0–37)
Albumin: 4.3 g/dL (ref 3.5–5.2)
Alkaline Phosphatase: 95 U/L (ref 39–117)
BUN: 20 mg/dL (ref 6–23)
CO2: 28 mEq/L (ref 19–32)
Calcium: 9.3 mg/dL (ref 8.4–10.5)
Chloride: 97 mEq/L (ref 96–112)
Creatinine, Ser: 0.91 mg/dL (ref 0.40–1.20)
GFR: 62.79 mL/min (ref >60.00)
Glucose, Bld: 96 mg/dL (ref 70–99)
Potassium: 4.5 mEq/L (ref 3.5–5.1)
Sodium: 128 mEq/L — ABNORMAL LOW (ref 135–145)
Total Bilirubin: 0.4 mg/dL (ref 0.2–1.2)
Total Protein: 7.4 g/dL (ref 6.0–8.3)

## 2019-12-28 LAB — LIPID PANEL
Cholesterol: 117 mg/dL (ref 0–200)
HDL: 52.9 mg/dL (ref >39.00)
LDL Cholesterol: 51 mg/dL (ref 0–99)
NonHDL: 64.49
Total CHOL/HDL Ratio: 2
Triglycerides: 68 mg/dL (ref 0.0–149.0)
VLDL: 13.6 mg/dL (ref 0.0–40.0)

## 2019-12-28 LAB — HEMOGLOBIN A1C: Hgb A1c MFr Bld: 6.3 % (ref 4.6–6.5)

## 2019-12-28 NOTE — Patient Instructions (Addendum)
Reduce your tresiba insulin (your "24 hour insulin") dose to 35 units  Start checking pre and post meal,  And  bedtime sugars once you get the freestyle monitor   Do not take any morning insulin unless BS is 90 or higher. .  If you skip the insulin,  Check your 2 hr post breakfast sugar   If your meal has no starch , reduce your mealtime insulin to as follows:  4 units for  premeal blood sugar of 130 to 150 6 untis for sugar of 150 to 170 8 units for sugar os 170 to 190 10 units for sugar of 190 or higher    Your annual mammogram has been ordered.  You are encouraged (required) to call to make your appointment at [place] [phone]

## 2019-12-28 NOTE — Progress Notes (Signed)
Subjective:  Patient ID: Laura Reed, female    DOB: 08/23/1960  Age: 59 y.o. MRN: [id number]  CC: The primary encounter diagnosis was Breast cancer screening by mammogram. Diagnoses of Controlled type 2 DM with peripheral circulatory disorder ([place]) and Hyperlipidemia associated with type 2 diabetes mellitus ([place]) were also pertinent to this visit.  HPI Laura Reed presents for FOLLOW UP ON TYPE 2 DM  This visit occurred during the SARS-CoV-2 public health emergency.  Safety protocols were in place, including screening questions prior to the visit, additional usage of staff PPE, and extensive cleaning of exam room while observing appropriate contact time as indicated for disinfecting solutions.    Patient has received both doses of the [place] 19 vaccine without complications.  Patient continues to mask when outside of the home except when walking in yard or at safe distances from others .  Patient denies any change in mood or development of unhealthy behaviors resuting from the pandemic's restriction of activities and socialization.   DIABETIC Neuropathy.  Feet examined,  IN EXCELLENT SHAPE,  DIABETIC SHOES from [employer] resolved her ankle pain after seeing Cleda Mccreedy   And vascular to rule out vascular issues   Blood sugars improving . Highest 203 in the last 2 weeks .  Having lows in the early morning . Had a low  Of 38 the morning of cataract surgery due to NPO status and inadequate reduction of qhs insulin dose from 50 to 40 ,   .  Currently taking 40 units  Tresiba at night  10 units of Lispro qac,  And 0.5 mg Ozempic weekly.  Cardiology:  Digoxin stopped by [name] at last visit   Outpatient Medications Prior to Visit  Medication Sig Dispense Refill  . acetaminophen (TYLENOL) 500 MG tablet Take 1,000 mg by mouth every 6 (six) hours as needed for mild pain or headache.     . ALPRAZolam (XANAX) 0.5 MG tablet Take 1 tablet (0.5 mg total) by mouth daily as needed. for anxiety 30  tablet 5  . amLODipine (NORVASC) 5 MG tablet TAKE 1 TABLET(5 MG) BY MOUTH DAILY 90 tablet 1  . aspirin 81 MG chewable tablet Chew 81 mg by mouth at bedtime.     Marland Kitchen atorvastatin (LIPITOR) 20 MG tablet Take 1 tablet (20 mg total) by mouth daily. 90 tablet 1  . Cholecalciferol (VITAMIN D3) 2000 UNITS TABS Take 2,000 Units by mouth daily after supper.     . clopidogrel (PLAVIX) 75 MG tablet Take 1 tablet (75 mg total) by mouth daily. 90 tablet 1  . gabapentin (NEURONTIN) 600 MG tablet Take 1 tablet (600 mg total) by mouth 3 (three) times daily. 270 tablet 1  . glucose blood (ACCU-CHEK AVIVA PLUS) test strip Test blood sugar 4 times/ day. 400 each 3  . insulin degludec (TRESIBA FLEXTOUCH) 100 UNIT/ML FlexTouch Pen Inject 0.4 mLs (40 Units total) into the skin daily. 45 mL 3  . insulin lispro (HUMALOG KWIKPEN) 100 UNIT/ML KwikPen Inject 0.1 mLs (10 Units total) into the skin 3 (three) times daily before meals. 27 mL 3  . Insulin Pen Needle (B-D ULTRAFINE III SHORT PEN) 31G X 8 MM MISC Use to inject insulin 4 times daily 400 each 3  . Lancets Misc. MISC Use to check blood sugars 4 times daily 400 each 3  . losartan (COZAAR) 50 MG tablet Take 50 mg by mouth daily.    . Semaglutide,0.25 or 0.5MG /DOS, (OZEMPIC, 0.25 OR 0.5 MG/DOSE,) 2  MG/1.5ML SOPN Inject 0.25 mg once weekly for 4 weeks, then increase to 0.5 mg weekly 1 pen 2   No facility-administered medications prior to visit.    Review of Systems;  Patient denies headache, fevers, malaise, unintentional weight loss, skin rash, eye pain, sinus congestion and sinus pain, sore throat, dysphagia,  hemoptysis , cough, dyspnea, wheezing, chest pain, palpitations, orthopnea, edema, abdominal pain, nausea, melena, diarrhea, constipation, flank pain, dysuria, hematuria, urinary  Frequency, nocturia, numbness, tingling, seizures,  Focal weakness, Loss of consciousness,  Tremor, insomnia, depression, anxiety, and suicidal ideation.      Objective:  BP 138/72  (BP Location: Left Arm, Patient Position: Sitting, Cuff Size: Normal)   Pulse 83   Temp 97.7 F (36.5 C) (Temporal)   Resp 15   Ht 5' 6.5" (1.689 m)   Wt 168 lb 6.4 oz (76.4 kg)   SpO2 98%   BMI 26.77 kg/m   BP Readings from Last 3 Encounters:  12/28/19 138/72  12/06/19 (!) 89/49  10/17/19 112/64    Wt Readings from Last 3 Encounters:  12/28/19 168 lb 6.4 oz (76.4 kg)  12/06/19 168 lb (76.2 kg)  10/17/19 164 lb (74.4 kg)    General appearance: alert, cooperative and appears stated age Ears: normal TM's and external ear canals both ears Throat: lips, mucosa, and tongue normal; teeth and gums normal Neck: no adenopathy, no carotid bruit, supple, symmetrical, trachea midline and thyroid not enlarged, symmetric, no tenderness/mass/nodules Back: symmetric, no curvature. ROM normal. No CVA tenderness. Lungs: clear to auscultation bilaterally Heart: regular rate and rhythm, S1, S2 normal, no murmur, click, rub or gallop Abdomen: soft, non-tender; bowel sounds normal; no masses,  no organomegaly Pulses: 2+ and symmetric Skin: Skin color, texture, turgor normal. No rashes or lesions Lymph nodes: Cervical, supraclavicular, and axillary nodes normal.  Lab Results  Component Value Date   HGBA1C 6.3 12/28/2019   HGBA1C 8.8 (H) 09/26/2019   HGBA1C 7.7 (H) 05/26/2019    Lab Results  Component Value Date   CREATININE 0.91 12/28/2019   CREATININE 0.87 09/26/2019   CREATININE 0.93 05/26/2019    Lab Results  Component Value Date   WBC 8.9 01/19/2019   HGB 12.2 01/19/2019   HCT 37.1 01/19/2019   PLT 289 01/19/2019   GLUCOSE 96 12/28/2019   CHOL 117 12/28/2019   TRIG 68.0 12/28/2019   HDL 52.90 12/28/2019   LDLDIRECT 47.0 01/28/2017   LDLCALC 51 12/28/2019   ALT 20 12/28/2019   AST 21 12/28/2019   NA 128 (L) 12/28/2019   K 4.5 12/28/2019   CL 97 12/28/2019   CREATININE 0.91 12/28/2019   BUN 20 12/28/2019   CO2 28 12/28/2019   TSH 0.96 05/18/2015   INR 0.9 10/17/2014     HGBA1C 6.3 12/28/2019   MICROALBUR 18.0 (H) 05/26/2019    No results found.  Assessment & Plan:   Problem List Items Addressed This Visit      Unprioritized   Hyperlipidemia associated with type 2 diabetes mellitus ([place])    .Well controlled on current statin therapy.   Liver enzymes are normal , no changes today.  Lab Results  Component Value Date   CHOL 117 12/28/2019   HDL 52.90 12/28/2019   LDLCALC 51 12/28/2019   LDLDIRECT 47.0 01/28/2017   TRIG 68.0 12/28/2019   CHOLHDL 2 12/28/2019   Lab Results  Component Value Date   ALT 20 12/28/2019   AST 21 12/28/2019   ALKPHOS 95 12/28/2019   BILITOT 0.4  12/28/2019               Controlled type 2 DM with peripheral circulatory disorder ([facility])    Currently well-controlled on current medications but having recurrent hypoglycemic events.  Patient is advised to reduce Tresiba dose to 35 units , and a sliding scale insulin regimen given manage normoglycemia per meal.  She is reminded to schedule an annual eye exam and foot exam is normal today. Patient has no microalbuminuria. Patient is tolerating statin therapy for CAD risk reduction and on ACE/ARB for renal protection and hypertension   Lab Results  Component Value Date   HGBA1C 6.3 12/28/2019   Lab Results  Component Value Date   LABMICR See below: 08/09/2019   LABMICR See below: 02/25/2019   MICROALBUR 18.0 (H) 05/26/2019   MICROALBUR 6.6 (H) 09/11/2017           Relevant Orders   Hemoglobin A1c (Completed)   Comprehensive metabolic panel (Completed)   Lipid panel (Completed)    Other Visit Diagnoses    Breast cancer screening by mammogram    -  Primary   Relevant Orders   MM 3D SCREEN BREAST BILATERAL      I am having Hoyle Sauer E. Almendarez maintain her Vitamin D3, aspirin, acetaminophen, ALPRAZolam, clopidogrel, atorvastatin, amLODipine, gabapentin, losartan, Ozempic (0.25 or 0.5 MG/DOSE), insulin lispro, B-D ULTRAFINE III SHORT PEN, Accu-Chek Aviva  Plus, Lancets Misc., and Tresiba FlexTouch.  No orders of the defined types were placed in this encounter.   There are no discontinued medications.  Follow-up: Return in about 3 months (around 03/29/2020) for follow up diabetes.   [name], MD

## 2019-12-29 ENCOUNTER — Ambulatory Visit: Payer: Medicare Other

## 2019-12-30 ENCOUNTER — Other Ambulatory Visit: Payer: Self-pay

## 2019-12-30 MED ORDER — ALPRAZOLAM 0.5 MG PO TABS: 0.5000 mg | ORAL_TABLET | Freq: Every day | ORAL | 5 refills | Status: DC | PRN

## 2019-12-30 NOTE — Assessment & Plan Note (Signed)
.  Well controlled on current statin therapy.   Liver enzymes are normal , no changes today.  Lab Results  Component Value Date   CHOL 117 12/28/2019   HDL 52.90 12/28/2019   LDLCALC 51 12/28/2019   LDLDIRECT 47.0 01/28/2017   TRIG 68.0 12/28/2019   CHOLHDL 2 12/28/2019   Lab Results  Component Value Date   ALT 20 12/28/2019   AST 21 12/28/2019   ALKPHOS 95 12/28/2019   BILITOT 0.4 12/28/2019

## 2019-12-30 NOTE — Telephone Encounter (Signed)
Refilled: 05/18/2019 Last OV: 12/28/2019 Next OV: 03/30/2020

## 2019-12-30 NOTE — Assessment & Plan Note (Addendum)
Currently well-controlled on current medications but having recurrent hypoglycemic events.  Patient is advised to reduce Tresiba dose to 35 units , and a sliding scale insulin regimen given manage normoglycemia per meal.  She is reminded to schedule an annual eye exam and foot exam is normal today. Patient has no microalbuminuria. Patient is tolerating statin therapy for CAD risk reduction and on ACE/ARB for renal protection and hypertension   Lab Results  Component Value Date   HGBA1C 6.3 12/28/2019   Lab Results  Component Value Date   LABMICR See below: 08/09/2019   LABMICR See below: 02/25/2019   MICROALBUR 18.0 (H) 05/26/2019   MICROALBUR 6.6 (H) 09/11/2017

## 2020-01-13 DIAGNOSIS — E113512 Type 2 diabetes mellitus with proliferative diabetic retinopathy with macular edema, left eye: Secondary | ICD-10-CM | POA: Diagnosis not present

## 2020-01-18 ENCOUNTER — Other Ambulatory Visit: Payer: Self-pay

## 2020-01-18 MED ORDER — ATORVASTATIN CALCIUM 20 MG PO TABS: 20.0000 mg | ORAL_TABLET | Freq: Every day | ORAL | 1 refills | Status: DC

## 2020-01-18 NOTE — Addendum Note (Signed)
Addended by: [name] on: 01/18/2020 07:50 PM   Modules accepted: Orders

## 2020-01-18 NOTE — Telephone Encounter (Signed)
Historical medication  Last OV: 12/28/2019 Next OV: 03/30/2020

## 2020-01-19 ENCOUNTER — Other Ambulatory Visit: Payer: Self-pay

## 2020-01-19 MED ORDER — CLOPIDOGREL BISULFATE 75 MG PO TABS: 75.0000 mg | ORAL_TABLET | Freq: Every day | ORAL | 1 refills | Status: DC

## 2020-01-19 MED ORDER — AMLODIPINE BESYLATE 5 MG PO TABS: ORAL_TABLET | ORAL | 1 refills | Status: DC

## 2020-01-22 DIAGNOSIS — E118 Type 2 diabetes mellitus with unspecified complications: Secondary | ICD-10-CM | POA: Diagnosis not present

## 2020-01-25 DIAGNOSIS — B351 Tinea unguium: Secondary | ICD-10-CM | POA: Diagnosis not present

## 2020-01-25 DIAGNOSIS — I739 Peripheral vascular disease, unspecified: Secondary | ICD-10-CM | POA: Diagnosis not present

## 2020-01-25 DIAGNOSIS — L851 Acquired keratosis [keratoderma] palmaris et plantaris: Secondary | ICD-10-CM | POA: Diagnosis not present

## 2020-01-25 DIAGNOSIS — Z89421 Acquired absence of other right toe(s): Secondary | ICD-10-CM | POA: Diagnosis not present

## 2020-01-25 DIAGNOSIS — E1140 Type 2 diabetes mellitus with diabetic neuropathy, unspecified: Secondary | ICD-10-CM | POA: Diagnosis not present

## 2020-01-26 ENCOUNTER — Other Ambulatory Visit: Payer: Self-pay

## 2020-01-26 MED ORDER — LOSARTAN POTASSIUM 50 MG PO TABS: 50.0000 mg | ORAL_TABLET | Freq: Every day | ORAL | 1 refills | Status: DC

## 2020-01-26 NOTE — Telephone Encounter (Signed)
Historical medication Last OV: 12/28/2019 Next OV: 03/30/2020

## 2020-01-30 ENCOUNTER — Ambulatory Visit (INDEPENDENT_AMBULATORY_CARE_PROVIDER_SITE_OTHER): Payer: Medicare Other | Admitting: Pharmacist

## 2020-01-30 DIAGNOSIS — Z794 Long term (current) use of insulin: Secondary | ICD-10-CM | POA: Diagnosis not present

## 2020-01-30 DIAGNOSIS — I10 Essential (primary) hypertension: Secondary | ICD-10-CM

## 2020-01-30 DIAGNOSIS — I739 Peripheral vascular disease, unspecified: Secondary | ICD-10-CM

## 2020-01-30 DIAGNOSIS — E1165 Type 2 diabetes mellitus with hyperglycemia: Secondary | ICD-10-CM | POA: Diagnosis not present

## 2020-01-30 MED ORDER — TRESIBA FLEXTOUCH 100 UNIT/ML ~~LOC~~ SOPN: 30.0000 [IU] | PEN_INJECTOR | Freq: Every day | SUBCUTANEOUS | 3 refills | Status: DC

## 2020-01-30 NOTE — Chronic Care Management (AMB) (Signed)
Chronic Care Management   Follow Up Note   01/30/2020 Name: Laura Reed MRN: [id number] DOB: January 04, 1961  Referred by: [name], MD Reason for referral : Chronic Care Management (Medication Management )   Laura Reed is a 59 y.o. year old female who is a primary care patient of [name], [name], MD. The CCM team was consulted for assistance with chronic disease management and care coordination needs.    Contacted patient for medication management review.   Review of patient status, including review of consultants reports, relevant laboratory and other test results, and collaboration with appropriate care team members and the patient's provider was performed as part of comprehensive patient evaluation and provision of chronic care management services.    SDOH (Social Determinants of Health) assessments performed: Yes See Care Plan activities for detailed interventions related to [facility])     Outpatient Encounter Medications as of 01/30/2020  Medication Sig Note  . acetaminophen (TYLENOL) 500 MG tablet Take 1,000 mg by mouth every 6 (six) hours as needed for mild pain or headache.  01/30/2020: Taking at least 3 tabs daily  . ALPRAZolam (XANAX) 0.5 MG tablet Take 1 tablet (0.5 mg total) by mouth daily as needed. for anxiety 01/30/2020: Using Q3-4 days  . amLODipine (NORVASC) 5 MG tablet TAKE 1 TABLET(5 MG) BY MOUTH DAILY   . aspirin 81 MG chewable tablet Chew 81 mg by mouth at bedtime.    Marland Kitchen atorvastatin (LIPITOR) 20 MG tablet Take 1 tablet (20 mg total) by mouth daily.   . Cholecalciferol (VITAMIN D3) 2000 UNITS TABS Take 2,000 Units by mouth daily after supper.    . clopidogrel (PLAVIX) 75 MG tablet Take 1 tablet (75 mg total) by mouth daily.   Marland Kitchen gabapentin (NEURONTIN) 600 MG tablet Take 1 tablet (600 mg total) by mouth 3 (three) times daily.   . insulin degludec (TRESIBA FLEXTOUCH) 100 UNIT/ML FlexTouch Pen Inject 0.3 mLs (30 Units total) into the skin daily.   . insulin lispro  (HUMALOG KWIKPEN) 100 UNIT/ML KwikPen Inject 0.1 mLs (10 Units total) into the skin 3 (three) times daily before meals.   . Insulin Pen Needle (B-D ULTRAFINE III SHORT PEN) 31G X 8 MM MISC Use to inject insulin 4 times daily   . Lancets Misc. MISC Use to check blood sugars 4 times daily   . losartan (COZAAR) 50 MG tablet Take 1 tablet (50 mg total) by mouth daily.   . Semaglutide,0.25 or 0.'5MG'$ /DOS, (OZEMPIC, 0.25 OR 0.5 MG/DOSE,) 2 MG/1.5ML SOPN Inject 0.25 mg once weekly for 4 weeks, then increase to 0.5 mg weekly   . [DISCONTINUED] insulin degludec (TRESIBA FLEXTOUCH) 100 UNIT/ML FlexTouch Pen Inject 0.4 mLs (40 Units total) into the skin daily. 01/30/2020: 30 units  . [DISCONTINUED] glucose blood (ACCU-CHEK AVIVA PLUS) test strip Test blood sugar 4 times/ day.    No facility-administered encounter medications on file as of 01/30/2020.     Objective:   Goals Addressed              This Visit's Progress     Patient Stated   .  PharmD "I want to control my blood sugars better" (pt-stated)        CARE PLAN ENTRY (see longtitudinal plan of care for additional care plan information)  Current Barriers:  . Social, financial, community barriers:  o Denies any cost concerns w/ medication cost, she has Medicare and Medicaid  o Reports she was at [employer] and ran into someone,  knocked off sensor. Was due to replace in 2 days, though, so she wasn't concerned o Reports today having some increased pain/discomfort on the side of her R knee. No swelling or inflammation. Wonders if her neuropathy. Wonders if she can try a bottle of Voltaren that a friend gave her. Currently taking APAP 1500 mg total daily  . Diabetes: CONTROLLED; complicated by chronic medical conditions including PVD, CAD, peripheral neuropathy, s/p nephrectomy, most recent A1c 6.3% . Most recent eGFR: ~60 mL/min . Current antihyperglycemic regimen: Tresiba 30 units QPM (self reduced d/t hypoglycemia), Humalog per sliding scale  (<90-130: 2 units; 130-150s: 4 units; 150-170s: 6 units; 170-180s: 8 units; >180: 10 units) Ozempic 0.5 mg weekly o Resistant to metformin d/t things she has heard about lawsuits.  . Current glucose readings - utilizing FreeStyle Libre CGM. Using reader. Scanning at least 4 times daily, if not 5-6 times o Fastings: 110-120s o Pre-meals (checking before all 3 meals): 130-150s o Bedtime readings: 140-160s . Cardiovascular risk reduction: o Current hypertensive regimen: amlodipine 5 mg QPM, losartan 50 mg daily; home BP readings 130s-140s/70s o Current hyperlipidemia regimen: atorvastatin 20 mg daily; last LDL at goal <70 o Current antiplatelet regimen: ASA 81 mg daily, clopidogrel 75 mg daily, severe PVD  Pharmacist Clinical Goal(s):  Marland Kitchen Over the next 90 days, patient will work with PharmD and primary care provider to address optimized medication management   Interventions: . Comprehensive medication review performed, medication list updated in electronic medical record . Inter-disciplinary care team collaboration (see longitudinal plan of care) . Praised patient for improvement in glucose control w/ use of FreeStyle Libre as well as switch to basal/bolus regimen, plus addition of Ozempic. Continue current regimen. Scheduled face to face appt next month for CGM download and review . Reviewed risks of hypoglycemia and appropriate treatment. Encouraged continued use of CGM to notify when she has low blood sugars, and to avoid use of Humalog if she doesn't eat or eats a small meal . Reviewed goal BP. Continued current regimen. Reviewed benefit of evening dosing of antihypertensives . Discussed knee discomfort. Encouraged that if worsening, or pain, redness, or swelling, call for evaluation. Reviewed that Voltaren may provide benefit, w/o long term risks of NSAIDs, so she is welcome to try on her knee. If improvement, may be inflammatory discomfort. If not, she can at least have this information to  share w/ Dr. Derrel Nip at next appt.   Patient Self Care Activities:  . Patient will check blood glucose four times daily, document, and provide at future appointments . Patient will take medications as prescribed . Patient will contact provider with any episodes of hypoglycemia . Patient will report any questions or concerns to provider   Please see past updates related to this goal by clicking on the "Past Updates" button in the selected goal          Plan:  - Scheduled f/u face to face in ~4 weeks  [name], PharmD, [place], [place] Pharmacist [place] [phone]

## 2020-01-30 NOTE — Patient Instructions (Signed)
Visit Information  Goals Addressed              This Visit's Progress     Patient Stated     PharmD "I want to control my blood sugars better" (pt-stated)        CARE PLAN ENTRY (see longtitudinal plan of care for additional care plan information)  Current Barriers:   Social, financial, community barriers:  o Denies any cost concerns w/ medication cost, she has Medicare and Medicaid  o Reports she was at [employer] and ran into someone, knocked off sensor. Was due to replace in 2 days, though, so she wasn't concerned o Reports today having some increased pain/discomfort on the side of her R knee. No swelling or inflammation. Wonders if her neuropathy. Wonders if she can try a bottle of Voltaren that a friend gave her. Currently taking APAP 1500 mg total daily   Diabetes: CONTROLLED; complicated by chronic medical conditions including PVD, CAD, peripheral neuropathy, s/p nephrectomy, most recent A1c 6.3%  Most recent eGFR: ~60 mL/min  Current antihyperglycemic regimen: Tresiba 30 units QPM (self reduced d/t hypoglycemia), Humalog per sliding scale (<90-130: 2 units; 130-150s: 4 units; 150-170s: 6 units; 170-180s: 8 units; >180: 10 units) Ozempic 0.5 mg weekly o Resistant to metformin d/t things she has heard about lawsuits.   Current glucose readings - utilizing FreeStyle Libre CGM. Using reader. Scanning at least 4 times daily, if not 5-6 times o Fastings: 110-120s o Pre-meals (checking before all 3 meals): 130-150s o Bedtime readings: 140-160s  Cardiovascular risk reduction: o Current hypertensive regimen: amlodipine 5 mg QPM, losartan 50 mg daily; home BP readings 130s-140s/70s o Current hyperlipidemia regimen: atorvastatin 20 mg daily; last LDL at goal <70 o Current antiplatelet regimen: ASA 81 mg daily, clopidogrel 75 mg daily, severe PVD  Pharmacist Clinical Goal(s):   Over the next 90 days, patient will work with PharmD and primary care provider to address optimized  medication management   Interventions:  Comprehensive medication review performed, medication list updated in electronic medical record  Inter-disciplinary care team collaboration (see longitudinal plan of care)  Praised patient for improvement in glucose control w/ use of FreeStyle Libre as well as switch to basal/bolus regimen, plus addition of Ozempic. Continue current regimen. Scheduled face to face appt next month for CGM download and review  Reviewed risks of hypoglycemia and appropriate treatment. Encouraged continued use of CGM to notify when she has low blood sugars, and to avoid use of Humalog if she doesn't eat or eats a small meal  Reviewed goal BP. Continued current regimen. Reviewed benefit of evening dosing of antihypertensives  Discussed knee discomfort. Encouraged that if worsening, or pain, redness, or swelling, call for evaluation. Reviewed that Voltaren may provide benefit, w/o long term risks of NSAIDs, so she is welcome to try on her knee. If improvement, may be inflammatory discomfort. If not, she can at least have this information to share w/ Dr. Derrel Nip at next appt.   Patient Self Care Activities:   Patient will check blood glucose four times daily, document, and provide at future appointments  Patient will take medications as prescribed  Patient will contact provider with any episodes of hypoglycemia  Patient will report any questions or concerns to provider   Please see past updates related to this goal by clicking on the "Past Updates" button in the selected goal         The patient verbalized understanding of instructions provided today and declined a print  copy of patient instruction materials.  Plan:  - Scheduled f/u face to face in ~4 weeks  [name], PharmD, [place], [place] Pharmacist [place] [phone]

## 2020-02-07 ENCOUNTER — Other Ambulatory Visit: Payer: Self-pay | Admitting: Urology

## 2020-02-15 DIAGNOSIS — E113512 Type 2 diabetes mellitus with proliferative diabetic retinopathy with macular edema, left eye: Secondary | ICD-10-CM | POA: Diagnosis not present

## 2020-02-21 ENCOUNTER — Ambulatory Visit (INDEPENDENT_AMBULATORY_CARE_PROVIDER_SITE_OTHER): Payer: Medicare Other | Admitting: Pharmacist

## 2020-02-21 ENCOUNTER — Other Ambulatory Visit: Payer: Self-pay

## 2020-02-21 DIAGNOSIS — E1165 Type 2 diabetes mellitus with hyperglycemia: Secondary | ICD-10-CM | POA: Diagnosis not present

## 2020-02-21 DIAGNOSIS — Z794 Long term (current) use of insulin: Secondary | ICD-10-CM | POA: Diagnosis not present

## 2020-02-21 MED ORDER — OZEMPIC (1 MG/DOSE) 4 MG/3ML ~~LOC~~ SOPN: 1.0000 mg | PEN_INJECTOR | SUBCUTANEOUS | 2 refills | Status: DC

## 2020-02-21 MED ORDER — TRESIBA FLEXTOUCH 100 UNIT/ML ~~LOC~~ SOPN: 24.0000 [IU] | PEN_INJECTOR | Freq: Every day | SUBCUTANEOUS | 3 refills | Status: DC

## 2020-02-21 NOTE — Chronic Care Management (AMB) (Signed)
Chronic Care Management   Follow Up Note   02/21/2020 Name: Laura Reed MRN: [id number] DOB: Mar 22, 1961  Referred by: [name], MD Reason for referral : Chronic Care Management (Medication Managment)   Laura Reed is a 59 y.o. year old female who is a primary care patient of [name], [name], MD. The CCM team was consulted for assistance with chronic disease management and care coordination needs.    Met with patient face to face for CGM download and review  Review of patient status, including review of consultants reports, relevant laboratory and other test results, and collaboration with appropriate care team members and the patient's provider was performed as part of comprehensive patient evaluation and provision of chronic care management services.    SDOH (Social Determinants of Health) assessments performed: Yes See Care Plan activities for detailed interventions related to SDOH)  SDOH Interventions     Most Recent Value  SDOH Interventions  Financial Strain Interventions Intervention Not Indicated       Outpatient Encounter Medications as of 02/21/2020  Medication Sig Note  . acetaminophen (TYLENOL) 500 MG tablet Take 1,000 mg by mouth every 6 (six) hours as needed for mild pain or headache.  02/21/2020: Using ~2-3 times weekly  . ALPRAZolam (XANAX) 0.5 MG tablet Take 1 tablet (0.5 mg total) by mouth daily as needed. for anxiety 02/21/2020: Using ~ twice weekly for sleep  . amLODipine (NORVASC) 5 MG tablet TAKE 1 TABLET(5 MG) BY MOUTH DAILY   . aspirin 81 MG chewable tablet Chew 81 mg by mouth at bedtime.    Marland Kitchen atorvastatin (LIPITOR) 20 MG tablet Take 1 tablet (20 mg total) by mouth daily.   . Cholecalciferol (VITAMIN D3) 2000 UNITS TABS Take 2,000 Units by mouth daily after supper.    . clopidogrel (PLAVIX) 75 MG tablet Take 1 tablet (75 mg total) by mouth daily.   Marland Kitchen gabapentin (NEURONTIN) 600 MG tablet Take 1 tablet (600 mg total) by mouth 3 (three) times daily.    . insulin degludec (TRESIBA FLEXTOUCH) 100 UNIT/ML FlexTouch Pen Inject 0.24 mLs (24 Units total) into the skin daily.   . insulin lispro (HUMALOG KWIKPEN) 100 UNIT/ML KwikPen Inject 0.1 mLs (10 Units total) into the skin 3 (three) times daily before meals.   . Insulin Pen Needle (B-D ULTRAFINE III SHORT PEN) 31G X 8 MM MISC Use to inject insulin 4 times daily   . Lancets Misc. MISC Use to check blood sugars 4 times daily   . losartan (COZAAR) 50 MG tablet Take 1 tablet (50 mg total) by mouth daily.   . [DISCONTINUED] insulin degludec (TRESIBA FLEXTOUCH) 100 UNIT/ML FlexTouch Pen Inject 0.3 mLs (30 Units total) into the skin daily.   . [DISCONTINUED] Semaglutide,0.25 or 0.'5MG'$ /DOS, (OZEMPIC, 0.25 OR 0.5 MG/DOSE,) 2 MG/1.5ML SOPN Inject 0.25 mg once weekly for 4 weeks, then increase to 0.5 mg weekly   . Semaglutide, 1 MG/DOSE, (OZEMPIC, 1 MG/DOSE,) 4 MG/3ML SOPN Inject 0.75 mLs (1 mg total) into the skin once a week.    No facility-administered encounter medications on file as of 02/21/2020.     Objective:   Goals Addressed              This Visit's Progress     Patient Stated   .  PharmD "I want to control my blood sugars better" (pt-stated)        CARE PLAN ENTRY (see longtitudinal plan of care for additional care plan information)  Current Barriers:  .  Social, financial, community barriers:  o Denies any at this time.  . Diabetes: CONTROLLED, though not optimally managed per CGM downlaods; complicated by chronic medical conditions including PVD, CAD, peripheral neuropathy, s/p nephrectomy, most recent A1c 6.3% . Most recent eGFR: ~60 mL/min . Current antihyperglycemic regimen: Tresiba 30 units QPM (self reduced d/t hypoglycemia), Humalog per sliding scale (<90-130: 2 units; 130-150s: 4 units; 150-170s: 6 units; 170-180s: 8 units; >180: 10 units) Ozempic 0.5 mg weekly o Resistant to metformin d/t things she has heard about lawsuits.  . Current glucose readings - utilizing FreeStyle  Libre CGM Date of Download: 8/4-8/17/21 % Time CGM is active: 92% Average Glucose: 148 mg/dL Glucose Management Indicator: 6.9% Glucose Variability: 32.5 (goal <36%) Time in Goal:  - Time in range 70-180: 73% - Time above range: 24% - Time below range: 3% Observed patterns: episodes of hypoglycemia that she overcorrects by eating, causing hyperglycemia. Also reports that she is worried about going to bed "too low", so has been eating bedtime snacks  . Cardiovascular risk reduction: o Current hypertensive regimen: amlodipine 5 mg QPM, losartan 50 mg daily; last clinic BP reading at goal o Current hyperlipidemia regimen: atorvastatin 20 mg daily; last LDL at goal <70 o Current antiplatelet regimen: ASA 81 mg daily, clopidogrel 75 mg daily, severe PVD  Pharmacist Clinical Goal(s):  Marland Kitchen Over the next 90 days, patient will work with PharmD and primary care provider to address optimized medication management   Interventions: . Comprehensive medication review performed, medication list updated in electronic medical record . Inter-disciplinary care team collaboration (see longitudinal plan of care) . Downloaded CGM. Reviewed report. Educated on Rule of 15. Patient verbalizes understanding . Goal to reduce episodes of hypoglycemia that patient overtreats. Increase Ozempic to 1 mg weekly. Decrease Tresiba to 24 units daily to reduce risk of hypoglycemia. HOLD on Humalog at this time.   Patient Self Care Activities:  . Patient will check blood glucose four times daily, document, and provide at future appointments . Patient will take medications as prescribed . Patient will contact provider with any episodes of hypoglycemia . Patient will report any questions or concerns to provider   Please see past updates related to this goal by clicking on the "Past Updates" button in the selected goal          Plan:  - Scheduled f/u face to face appt in ~ 2 weeks  [name], PharmD, [place],  [place] Pharmacist [place] [phone]

## 2020-02-21 NOTE — Patient Instructions (Addendum)
It was so nice to meet you today!  Today:  1) Increase Ozempic to 1 mg weekly. To use your current supply, you can do 2 injections of 0.5 mg, one after the other. I will send the prescription for the higher dose to your pharmacy.  2) Decrease Tresiba to 24 units daily.  3) HOLD using Humalog. We will re-assess when you come back to see me how we will need this medication.   Call me if you still have low blood sugars.   When you have a low, use the Rule of 15 to treat your low.   [name], PharmD [phone]  Visit Information  Goals Addressed              This Visit's Progress     Patient Stated   .  PharmD "I want to control my blood sugars better" (pt-stated)        CARE PLAN ENTRY (see longtitudinal plan of care for additional care plan information)  Current Barriers:  . Social, financial, community barriers:  o Denies any at this time.  . Diabetes: CONTROLLED, though not optimally managed per CGM downlaods; complicated by chronic medical conditions including PVD, CAD, peripheral neuropathy, s/p nephrectomy, most recent A1c 6.3% . Most recent eGFR: ~60 mL/min . Current antihyperglycemic regimen: Tresiba 30 units QPM (self reduced d/t hypoglycemia), Humalog per sliding scale (<90-130: 2 units; 130-150s: 4 units; 150-170s: 6 units; 170-180s: 8 units; >180: 10 units) Ozempic 0.5 mg weekly o Resistant to metformin d/t things she has heard about lawsuits.  . Current glucose readings - utilizing FreeStyle Libre CGM Date of Download: 8/4-8/17/21 % Time CGM is active: 92% Average Glucose: 148 mg/dL Glucose Management Indicator: 6.9% Glucose Variability: 32.5 (goal <36%) Time in Goal:  - Time in range 70-180: 73% - Time above range: 24% - Time below range: 3% Observed patterns: episodes of hypoglycemia that she overcorrects by eating, causing hyperglycemia. Also reports that she is worried about going to bed "too low", so has been eating bedtime snacks  . Cardiovascular  risk reduction: o Current hypertensive regimen: amlodipine 5 mg QPM, losartan 50 mg daily; last clinic BP reading at goal o Current hyperlipidemia regimen: atorvastatin 20 mg daily; last LDL at goal <70 o Current antiplatelet regimen: ASA 81 mg daily, clopidogrel 75 mg daily, severe PVD  Pharmacist Clinical Goal(s):  Marland Kitchen Over the next 90 days, patient will work with PharmD and primary care provider to address optimized medication management   Interventions: . Comprehensive medication review performed, medication list updated in electronic medical record . Inter-disciplinary care team collaboration (see longitudinal plan of care) . Downloaded CGM. Reviewed report. Educated on Rule of 15. Patient verbalizes understanding . Goal to reduce episodes of hypoglycemia that patient overtreats. Increase Ozempic to 1 mg weekly. Decrease Tresiba to 24 units daily to reduce risk of hypoglycemia. HOLD on Humalog at this time.   Patient Self Care Activities:  . Patient will check blood glucose four times daily, document, and provide at future appointments . Patient will take medications as prescribed . Patient will contact provider with any episodes of hypoglycemia . Patient will report any questions or concerns to provider   Please see past updates related to this goal by clicking on the "Past Updates" button in the selected goal         Print copy of patient instructions provided.    Plan:  - Scheduled f/u face to face appt in ~ 2 weeks  [name], PharmD,  BCACP, CPP Clinical Pharmacist [place] [phone]

## 2020-02-22 DIAGNOSIS — E118 Type 2 diabetes mellitus with unspecified complications: Secondary | ICD-10-CM | POA: Diagnosis not present

## 2020-02-27 NOTE — Progress Notes (Signed)
° °  02/28/2020  CC:  Chief Complaint  Patient presents with   Cysto    HPI: Laura Reed is a 59 y.o. female with with history of pT1N0 left upper tract urothelial carcinoma returns for a 6 month surveillance cystoscopy.  1- Left Upper Tract Transitional Cell Carcinoma/ Bladder cancer recurrence- s/p LEFT robotic nephroureterectomy on 10/31/2014 as well as combined ventral hernia repair with Dr. Bary Castilla. Post complicated by wound infection now well healed. Pathology pT1N0 with negative margins.  Recent Surveillance: 02/2015 - cysto NED; 05/2015 cysto NED, 08/2015- cysto NED, 11/2015- NED, CT Urogram 11/12/15 negative, poor quality of delayed phase.; 5/2-17- cysto with mild erythema following recent UTI; 02/2016 NED; 08/2016 Lg Ta TCC recurrence, 1 cm, multifocal. RTG neg.  08/2016- TURBT for bladder 1 cm recurrence near bladder neck and left UO, multifocal. R RTG negative. Mitomycin. LgTa. 10/2016- Induction BCG x 6 08/2017- Maintenance BCG x 3 02/2019- Maintenance BCG x 3  2 - Solitary Kidney - s/p left nephrectomyas per above. Most recent Cr 0.91 12/28/2019.  She does have an extensive smoking history, quit 5years ago but smoked up to 2 packs a day for 35 years. She also has multiple medical comorbidities including history of diabetes, CAD status post CABG, carotid endarterectomy.   Blood pressure (!) 124/56, pulse 82, height 5\' 7"  (1.702 m), weight 164 lb (74.4 kg). NED. A&Ox3.   No respiratory distress   Abd soft, NT, ND Normal external genitalia with patent urethral meatus  Cystoscopy Procedure Note  Patient identification was confirmed, informed consent was obtained, and patient was prepped using Betadine solution.  Lidocaine jelly was administered per urethral meatus.    Procedure: - Flexible cystoscope introduced, without any difficulty.   - Thorough search of the bladder revealed:    normal urethral meatus    No discrete papillary tumor.    normal urothelium     no stones    no ulcers     no tumors    no urethral polyps    no trabeculation  - Right UO normal. Left UO was surgically absent. There was an area near the dome slightly raised measuring less than 1 cm, areas of erythematous and textured.   - Ureteral orifices were normal in position and appearance.  Post-Procedure: - Patient tolerated the procedure well  Assessment/ Plan:  1. Malignant neoplasm of urinary bladder, unspecified site (HCC)/ bladder leasion  Personal history of upper tract urothelial carcinoma as well as bladder cancer, last recurrence 2018.  Based on cysto findings I recommend a bladder biopsy and bilateral retrograde pyelogram.   Risk including risk of bleeding, infection, demonstrating structures discussed in detail.  Continue aspirin but hold off on Plavix 5 days prior to surgery. Will need clearance.   Will continue surveillance cystoscopy every 6 months.   -Urine culture sent.    Fransico Him, am acting as a scribe for Dr. Hollice Espy.  I have reviewed the above documentation for accuracy and completeness, and I agree with the above.   Hollice Espy, MD

## 2020-02-27 NOTE — H&P (View-Only) (Signed)
   02/28/2020  CC:  Chief Complaint  Patient presents with  . Cysto    HPI: Laura Reed is a 59 y.o. female with with history of pT1N0 left upper tract urothelial carcinoma returns for a 6 month surveillance cystoscopy.  1- Left Upper Tract Transitional Cell Carcinoma/ Bladder cancer recurrence- s/p LEFT robotic nephroureterectomy on 10/31/2014 as well as combined ventral hernia repair with Dr. Bary Castilla. Post complicated by wound infection now well healed. Pathology pT1N0 with negative margins.  Recent Surveillance: 02/2015 - cysto NED; 05/2015 cysto NED, 08/2015- cysto NED, 11/2015- NED, CT Urogram 11/12/15 negative, poor quality of delayed phase.; 5/2-17- cysto with mild erythema following recent UTI; 02/2016 NED; 08/2016 Lg Ta TCC recurrence, 1 cm, multifocal. RTG neg.  08/2016- TURBT for bladder 1 cm recurrence near bladder neck and left UO, multifocal. R RTG negative. Mitomycin. LgTa. 10/2016- Induction BCG x 6 08/2017- Maintenance BCG x 3 02/2019- Maintenance BCG x 3  2 - Solitary Kidney - s/p left nephrectomyas per above. Most recent Cr 0.91 12/28/2019.  She does have an extensive smoking history, quit 5years ago but smoked up to 2 packs a day for 35 years. She also has multiple medical comorbidities including history of diabetes, CAD status post CABG, carotid endarterectomy.   Blood pressure (!) 124/56, pulse 82, height 5\' 7"  (1.702 m), weight 164 lb (74.4 kg). NED. A&Ox3.   No respiratory distress   Abd soft, NT, ND Normal external genitalia with patent urethral meatus  Cystoscopy Procedure Note  Patient identification was confirmed, informed consent was obtained, and patient was prepped using Betadine solution.  Lidocaine jelly was administered per urethral meatus.    Procedure: - Flexible cystoscope introduced, without any difficulty.   - Thorough search of the bladder revealed:    normal urethral meatus    No discrete papillary tumor.    normal urothelium     no stones    no ulcers     no tumors    no urethral polyps    no trabeculation  - Right UO normal. Left UO was surgically absent. There was an area near the dome slightly raised measuring less than 1 cm, areas of erythematous and textured.   - Ureteral orifices were normal in position and appearance.  Post-Procedure: - Patient tolerated the procedure well  Assessment/ Plan:  1. Malignant neoplasm of urinary bladder, unspecified site (HCC)/ bladder leasion  Personal history of upper tract urothelial carcinoma as well as bladder cancer, last recurrence 2018.  Based on cysto findings I recommend a bladder biopsy and bilateral retrograde pyelogram.   Risk including risk of bleeding, infection, demonstrating structures discussed in detail.  Continue aspirin but hold off on Plavix 5 days prior to surgery. Will need clearance.   Will continue surveillance cystoscopy every 6 months.   -Urine culture sent.    Fransico Him, am acting as a scribe for Dr. Hollice Espy.  I have reviewed the above documentation for accuracy and completeness, and I agree with the above.   Hollice Espy, MD

## 2020-02-28 ENCOUNTER — Other Ambulatory Visit: Payer: Self-pay

## 2020-02-28 ENCOUNTER — Ambulatory Visit (INDEPENDENT_AMBULATORY_CARE_PROVIDER_SITE_OTHER): Payer: Medicare Other | Admitting: Urology

## 2020-02-28 ENCOUNTER — Encounter: Payer: Self-pay | Admitting: Urology

## 2020-02-28 VITALS — BP 124/56 | HR 82 | Ht 67.0 in | Wt 164.0 lb

## 2020-02-28 DIAGNOSIS — Z8551 Personal history of malignant neoplasm of bladder: Secondary | ICD-10-CM

## 2020-02-29 ENCOUNTER — Telehealth: Payer: Self-pay | Admitting: Radiology

## 2020-02-29 LAB — URINALYSIS, COMPLETE
Bilirubin, UA: NEGATIVE
Ketones, UA: NEGATIVE
Leukocytes,UA: NEGATIVE
Nitrite, UA: NEGATIVE
Protein,UA: NEGATIVE
RBC, UA: NEGATIVE
Specific Gravity, UA: 1.01 (ref 1.005–1.030)
Urobilinogen, Ur: 0.2 mg/dL (ref 0.2–1.0)
pH, UA: 6 (ref 5.0–7.5)

## 2020-02-29 LAB — MICROSCOPIC EXAMINATION
Bacteria, UA: NONE SEEN
RBC, Urine: NONE SEEN /hpf (ref 0–2)

## 2020-02-29 NOTE — Telephone Encounter (Signed)
LMOM on both home and cell numbers to return call. Need to discuss surgery with Dr Erlene Quan.

## 2020-03-01 ENCOUNTER — Other Ambulatory Visit: Payer: Self-pay | Admitting: Radiology

## 2020-03-01 DIAGNOSIS — Z8551 Personal history of malignant neoplasm of bladder: Secondary | ICD-10-CM

## 2020-03-01 DIAGNOSIS — N329 Bladder disorder, unspecified: Secondary | ICD-10-CM

## 2020-03-02 LAB — CULTURE, URINE COMPREHENSIVE

## 2020-03-03 ENCOUNTER — Other Ambulatory Visit: Payer: Self-pay | Admitting: Internal Medicine

## 2020-03-07 ENCOUNTER — Other Ambulatory Visit: Payer: Self-pay

## 2020-03-07 ENCOUNTER — Ambulatory Visit: Payer: Medicare Other | Admitting: Pharmacist

## 2020-03-07 DIAGNOSIS — E1142 Type 2 diabetes mellitus with diabetic polyneuropathy: Secondary | ICD-10-CM

## 2020-03-07 DIAGNOSIS — E1165 Type 2 diabetes mellitus with hyperglycemia: Secondary | ICD-10-CM

## 2020-03-07 DIAGNOSIS — I10 Essential (primary) hypertension: Secondary | ICD-10-CM

## 2020-03-07 MED ORDER — PEN NEEDLES 32G X 4 MM MISC: 3 refills | Status: DC

## 2020-03-07 MED ORDER — TRESIBA FLEXTOUCH 100 UNIT/ML ~~LOC~~ SOPN: 28.0000 [IU] | PEN_INJECTOR | Freq: Every day | SUBCUTANEOUS | 3 refills | Status: DC

## 2020-03-07 NOTE — Chronic Care Management (AMB) (Signed)
Chronic Care Management   Follow Up Note   03/07/2020 Name: Laura Reed MRN: [id number] DOB: 1960/12/17  Referred by: Sherlene Shams, MD Reason for referral : Chronic Care Management (Medication Management)   Laura Reed is a 59 y.o. year old female who is a primary care patient of [name], Mar Daring, MD. The CCM team was consulted for assistance with chronic disease management and care coordination needs.    Met with patient face to face for CGM download.   Review of patient status, including review of consultants reports, relevant laboratory and other test results, and collaboration with appropriate care team members and the patient's provider was performed as part of comprehensive patient evaluation and provision of chronic care management services.    SDOH (Social Determinants of Health) assessments performed: Yes See Care Plan activities for detailed interventions related to SDOH)  SDOH Interventions     Most Recent Value  SDOH Interventions  Financial Strain Interventions Intervention Not Indicated       Outpatient Encounter Medications as of 03/07/2020  Medication Sig Note  . insulin degludec (TRESIBA FLEXTOUCH) 100 UNIT/ML FlexTouch Pen Inject 0.28 mLs (28 Units total) into the skin daily.   . Semaglutide, 1 MG/DOSE, (OZEMPIC, 1 MG/DOSE,) 4 MG/3ML SOPN Inject 0.75 mLs (1 mg total) into the skin once a week.   . [DISCONTINUED] insulin degludec (TRESIBA FLEXTOUCH) 100 UNIT/ML FlexTouch Pen Inject 0.24 mLs (24 Units total) into the skin daily.   Marland Kitchen acetaminophen (TYLENOL) 500 MG tablet Take 1,000 mg by mouth every 6 (six) hours as needed for mild pain or headache.  02/21/2020: Using ~2-3 times weekly  . ALPRAZolam (XANAX) 0.5 MG tablet Take 1 tablet (0.5 mg total) by mouth daily as needed. for anxiety 02/21/2020: Using ~ twice weekly for sleep  . amLODipine (NORVASC) 5 MG tablet TAKE 1 TABLET(5 MG) BY MOUTH DAILY   . aspirin 81 MG chewable tablet Chew 81 mg by mouth at bedtime.     Marland Kitchen atorvastatin (LIPITOR) 20 MG tablet Take 1 tablet (20 mg total) by mouth daily.   . Cholecalciferol (VITAMIN D3) 2000 UNITS TABS Take 2,000 Units by mouth daily after supper.    . clopidogrel (PLAVIX) 75 MG tablet Take 1 tablet (75 mg total) by mouth daily.   Marland Kitchen gabapentin (NEURONTIN) 600 MG tablet TAKE 1 TABLET(600 MG) BY MOUTH THREE TIMES DAILY   . Insulin Pen Needle (PEN NEEDLES) 32G X 4 MM MISC Use to take insulin daily   . Lancets Misc. MISC Use to check blood sugars 4 times daily   . losartan (COZAAR) 50 MG tablet Take 1 tablet (50 mg total) by mouth daily.   . [DISCONTINUED] Insulin Pen Needle (B-D ULTRAFINE III SHORT PEN) 31G X 8 MM MISC Use to inject insulin 4 times daily    No facility-administered encounter medications on file as of 03/07/2020.     Objective:      Goals Addressed              This Visit's Progress     Patient Stated   .  PharmD "I want to control my blood sugars better" (pt-stated)        CARE PLAN ENTRY (see longtitudinal plan of care for additional care plan information)  Current Barriers:  . Social, financial, community barriers:  o Denies concerns at this time. Appreciating use of CGM . Diabetes: CONTROLLED, though not optimally managed per CGM downloads; complicated by chronic medical conditions including PVD, CAD, peripheral  neuropathy, s/p nephrectomy, most recent A1c 6.3% . Most recent eGFR: ~60 mL/min . Current antihyperglycemic regimen: Tresiba 24 units QPM, Ozempic 1 mg weekly (has only taken 1 dose of this dose), and Humalog PRN BG >200 (decided to use this as a correctional insulin herself) o Resistant to metformin d/t things she has heard about lawsuits.  . Current glucose readings - utilizing FreeStyle Libre CGM Date of Download: 02/23/20-03/07/20 % Time CGM is active: 91% Average Glucose: 153 mg/dL Glucose Management Indicator: 7.0% Glucose Variability: 24.5% (goal <36%) Time in Goal:  - Time in range 70-180: 75% - Time above  range: 23% - Time below range: 2% Observed patterns: only episodes of hypoglycemia occur after dosing of Humalog. Elevated fastings . Cardiovascular risk reduction: o Current hypertensive regimen: amlodipine 5 mg QPM, losartan 50 mg daily; last clinic BP reading at goal o Current hyperlipidemia regimen: atorvastatin 20 mg daily; last LDL at goal <70 o Current antiplatelet regimen: ASA 81 mg daily, clopidogrel 75 mg daily, severe PVD  Pharmacist Clinical Goal(s):  Marland Kitchen Over the next 90 days, patient will work with PharmD and primary care provider to address optimized medication management   Interventions: . Comprehensive medication review performed, medication list updated in electronic medical record . Inter-disciplinary care team collaboration (see longitudinal plan of care) . Downloaded CGM data and reviewed with patient.  . Full impact of Ozempic dose increase likely has not been seen yet. Continue Ozempic 1 mg weekly. Counseled to STOP Humalog, as her episodes of hypoglycemia always occur after she uses this as correctional insulin. Increase Tresiba to 28 units daily. She verbalized understanding  Patient Self Care Activities:  . Patient will check blood glucose four times daily, document, and provide at future appointments . Patient will take medications as prescribed . Patient will contact provider with any episodes of hypoglycemia . Patient will report any questions or concerns to provider   Please see past updates related to this goal by clicking on the "Past Updates" button in the selected goal          Plan:  - PCP f/u in ~ 3 weeks. Scheduled f/u in ~ 7 weeks  [name], PharmD, [place], [place] Pharmacist [place] [phone]

## 2020-03-07 NOTE — Patient Instructions (Addendum)
Increase Tresiba to 28 units daily.   Continue Ozempic to 1 mg weekly.  Keep up the [place]!  Follow up with Dr. Derrel Nip.  Visit Information  Goals Addressed              This Visit's Progress     Patient Stated   .  PharmD "I want to control my blood sugars better" (pt-stated)        CARE PLAN ENTRY (see longtitudinal plan of care for additional care plan information)  Current Barriers:  . Social, financial, community barriers:  o Denies concerns at this time. Appreciating use of CGM . Diabetes: CONTROLLED, though not optimally managed per CGM downloads; complicated by chronic medical conditions including PVD, CAD, peripheral neuropathy, s/p nephrectomy, most recent A1c 6.3% . Most recent eGFR: ~60 mL/min . Current antihyperglycemic regimen: Tresiba 24 units QPM, Ozempic 1 mg weekly (has only taken 1 dose of this dose), and Humalog PRN BG >200 (decided to use this as a correctional insulin herself) o Resistant to metformin d/t things she has heard about lawsuits.  . Current glucose readings - utilizing FreeStyle Libre CGM Date of Download: 02/23/20-03/07/20 % Time CGM is active: 91% Average Glucose: 153 mg/dL Glucose Management Indicator: 7.0% Glucose Variability: 24.5% (goal <36%) Time in Goal:  - Time in range 70-180: 75% - Time above range: 23% - Time below range: 2% Observed patterns: only episodes of hypoglycemia occur after dosing of Humalog. Elevated fastings . Cardiovascular risk reduction: o Current hypertensive regimen: amlodipine 5 mg QPM, losartan 50 mg daily; last clinic BP reading at goal o Current hyperlipidemia regimen: atorvastatin 20 mg daily; last LDL at goal <70 o Current antiplatelet regimen: ASA 81 mg daily, clopidogrel 75 mg daily, severe PVD  Pharmacist Clinical Goal(s):  Marland Kitchen Over the next 90 days, patient will work with PharmD and primary care provider to address optimized medication management   Interventions: . Comprehensive medication review  performed, medication list updated in electronic medical record . Inter-disciplinary care team collaboration (see longitudinal plan of care) . Downloaded CGM data and reviewed with patient.  . Full impact of Ozempic dose increase likely has not been seen yet. Continue Ozempic 1 mg weekly. Counseled to STOP Humalog, as her episodes of hypoglycemia always occur after she uses this as correctional insulin. Increase Tresiba to 28 units daily. She verbalized understanding  Patient Self Care Activities:  . Patient will check blood glucose four times daily, document, and provide at future appointments . Patient will take medications as prescribed . Patient will contact provider with any episodes of hypoglycemia . Patient will report any questions or concerns to provider   Please see past updates related to this goal by clicking on the "Past Updates" button in the selected goal         Print copy of patient instructions provided.   Plan:  - PCP f/u in ~ 3 weeks. Scheduled f/u in ~ 7 weeks  [name], PharmD, [place], [place] Pharmacist [place] [phone]

## 2020-03-08 ENCOUNTER — Encounter
Admission: RE | Admit: 2020-03-08 | Discharge: 2020-03-08 | Disposition: A | Discharge status: Home / Self Care | Payer: Medicare Other | Source: Ambulatory Visit | Attending: Urology | Admitting: Urology

## 2020-03-08 ENCOUNTER — Other Ambulatory Visit: Payer: Self-pay

## 2020-03-08 DIAGNOSIS — N329 Bladder disorder, unspecified: Secondary | ICD-10-CM

## 2020-03-08 HISTORY — DX: Gastro-esophageal reflux disease without esophagitis: K21.9

## 2020-03-08 HISTORY — DX: Unspecified osteoarthritis, unspecified site: M19.90

## 2020-03-08 HISTORY — DX: Other complications of anesthesia, initial encounter: T88.59XA

## 2020-03-08 NOTE — Patient Instructions (Addendum)
Your procedure is scheduled on: 03-19-20 MONDAY Report to Same Day Surgery 2nd floor medical mall [facility] Entrance-take elevator on left to 2nd floor.  Check in with surgery information desk.) To find out your arrival time please call [phone] between 1PM - 3PM on 03-16-20 FRIDAY  Remember: Instructions that are not followed completely may result in serious medical risk, up to and including death, or upon the discretion of your surgeon and anesthesiologist your surgery may need to be rescheduled.    _x___ 1. Do not eat food after midnight the night before your procedure. NO GUM OR CANDY AFTER MIDNIGHT. You may drink WATER up to 2 hours before you are scheduled to arrive at the hospital for your procedure.  Do not drink WATER within 2 hours of your scheduled arrival to the hospital.  Type 1 and type 2 diabetics should only drink water (YOU MAY DRINK APPLE JUICE IF YOUR BLOOD SUGAR DROPS)     __x__ 2. No Alcohol for 24 hours before or after surgery.   __x__3. No Smoking or e-cigarettes for 24 prior to surgery.  Do not use any chewable tobacco products for at least 6 hour prior to surgery   ____  4. Bring all medications with you on the day of surgery if instructed.    __x__ 5. Notify your doctor if there is any change in your medical condition     (cold, fever, infections).    x___6. On the morning of surgery brush your teeth with toothpaste and water.  You may rinse your mouth with mouth wash if you wish.  Do not swallow any toothpaste or mouthwash.   Do not wear jewelry, make-up, hairpins, clips or nail polish.  Do not wear lotions, powders, or perfumes. You may wear deodorant.  Do not shave 48 hours prior to surgery. Men may shave face and neck.  Do not bring valuables to the hospital.    [facility] is not responsible for any belongings or valuables.               Contacts, dentures or bridgework may not be worn into surgery.  Leave your suitcase in the car. After surgery  it may be brought to your room.  For patients admitted to the hospital, discharge time is determined by your treatment team.  _  Patients discharged the day of surgery will not be allowed to drive home.  You will need someone to drive you home and stay with you the night of your procedure.    Please read over the following fact sheets that you were given:   [facility] Preparing for Surgery   _x___ TAKE THE FOLLOWING MEDICATION THE MORNING OF SURGERY WITH A SMALL SIP OF WATER. These include:  1. GABAPENTIN (NEURONTIN)  2. YOU MAY TAKE YOUR XANAX (ALPRAZOLAM) THE MORNING OF SURGERY IF NEEDED  3.  4.  5.  6.  ____Fleets enema or Magnesium Citrate as directed.   ____ Use CHG Soap or sage wipes as directed on instruction sheet   ____ Use inhalers on the day of surgery and bring to hospital day of surgery  ____ Stop Metformin and Janumet 2 days prior to surgery.    _X___ Take 1/2 of usual insulin dose the night before surgery and none on the morning surgery-TAKE HALF (14 UNITS) OF TRESIBA Sunday NIGHT   _x___ Follow recommendations from Cardiologist, Pulmonologist or PCP regarding stopping Aspirin, Coumadin, Plavix ,Eliquis, Effient, or Pradaxa, and Pletal-DR BRANDON'S OFFICE INFORMED PATIENT  TO STOP PLAVIX ON 9-6 BUT TO CONTINUE ASPIRIN. DO NOT TAKE ASPIRIN DAY OF SURGERY  X____Stop Anti-inflammatories such as Advil, Aleve, Ibuprofen, Motrin, Naproxen, Naprosyn, Goodies powders or aspirin products 7 DAYS PRIOR TO SURGERY- OK to take Tylenol    ____ Stop supplements until after surgery.    ____ Bring C-Pap to the hospital.

## 2020-03-08 NOTE — Pre-Procedure Instructions (Signed)
Progress Notes - documented in this encounter Laura Reed, [place] - 10/10/2019 11:45 AM EDT Formatting of this note is different from the original. Images from the original note were not included. Established Patient Visit   Chief Complaint: Chief Complaint  Patient presents with  . Follow-up  1 year  Date of Service: 10/10/2019 Date of Birth: 07/30/1960 PCP: Laura Hick, MD  History of Present Illness: Laura Reed is a 59 y.o.female patient with a past medical history significant for coronary artery disease s/p CABG in [place] in 2010, severe, extensive peripheral vascular disease s/p stent placement in the left popliteal artery in 2019, right carotid endarterectomy in 2015, right femoral popliteal artery bypass in 2009, right iliac artery stent in 2009, and several toe amputations, followed by Laura Reed, type 2 diabetes, and hyperlipidemia who presents for a follow up visit. She reports that she is doing well from a cardiac standpoint and denies chest pain, palpitations, shortness of breath, lower extremity swelling, orthopnea, PND, or syncopal/presyncopal episodes. Her biggest concern today is right foot/ankle pain with numbness/tingling in which she is closely followed by PCP, podiatry, and vascular.   I personally reviewed the ECG performed in the office today which revealed normal sinus rhythm at a ventricular rate of 70bpm. Left heart catheterization in 2014 revealed patent grafts. Most recent echocardiogram revealed normal LV systolic function with an EF estimated between 55-60% with no significant valvular abnormalities.   Past Medical and Surgical History  Past Medical History Past Medical History:  Diagnosis Date  . CAD (coronary artery disease)  Patient denies history of MI. CABG was obtained before she had heart damage. Echocardiogram performed Laura Reed 2010 showed normal LV function and mild AI, trivial MR, TR and PI.  . Carotid artery stenosis 2010   MODERATE  . Diabetes mellitus type 2, uncomplicated (CMS-HCC)  Had gestational diabetes at age 70, thinks she developed true diabetes age 41 and really didn't start taking care of herself until about a decade ago.  . Diabetic retinopathy (CMS-HCC)  Diabetes mellitus complicated with diabetic retinopathy, macular edema.  . Hammertoe  . History of foot ulcer  Right foot ulceration secondary to atherosclerosis  . Hyperlipidemia  . Tobacco use  History of previous tobacco use   Past Surgical History She has a past surgical history that includes CABG (07/2008); Femoral artery - popliteal artery bypass graft (Right, 06/23/08); Iliac artery stent (Right, 12/09); Coronary angioplasty (Left, 05/28/10); Pilonidal cyst surgery (09/2009); Cesarean section; Cholecystectomy; and Leg catheterizations, several .   Medications and Allergies  Current Medications  Current Outpatient Medications  Medication Sig Dispense Refill  . ALPRAZolam (XANAX) 0.5 MG tablet Take 0.5 mg by mouth nightly as needed for Sleep  . amLODIPine (NORVASC) 5 MG tablet Take 1 tablet by mouth once daily  . aspirin 81 MG EC tablet Take 81 mg by mouth once daily.  Marland Kitchen atorvastatin (LIPITOR) 20 MG tablet Take 20 mg by mouth once daily 1  . cholecalciferol (VITAMIN D3) 2,000 unit capsule Take 2,000 Units by mouth once daily  . clopidogrel (PLAVIX) 75 mg tablet Take 75 mg by mouth once daily.  Marland Kitchen gabapentin (NEURONTIN) 600 MG tablet Take 600 mg by mouth 3 (three) times daily.  Marland Kitchen HUMALOG MIX 75-25 KWIKPEN 100 unit/mL (75-25) pen injector INJECT 30 UNITS [place] QAM AND 36 UNITS Q EVENING 11  . insulin DETEMIR (LEVEMIR FLEXTOUCH) 100 unit/mL (3 mL) pen injector Inject 30 Units subcutaneously nightly. As directed  . losartan (COZAAR) 50  MG tablet Take by mouth   No current facility-administered medications for this visit.   Allergies: Patient has no known allergies.  Social and Family History  Social History reports that she quit smoking  about 7 years ago. She has never used smokeless tobacco. She reports that she does not drink alcohol.  Family History Family History  Problem Relation Age of Onset  . Lung cancer Mother  . Heart disease Mother  . Lung cancer Father  . Heart disease Father   Review of Systems   Review of Systems: The patient denies chest pain, shortness of breath, orthopnea, paroxysmal nocturnal dyspnea, pedal edema, palpitations, heart racing, fatigue, dizziness, lightheadedness, presyncope, syncope, leg pain, leg cramping. Review of 12 Systems is negative except as described in HPI.   Physical Examination   Vitals:BP 142/62  Pulse 82  Ht 170.2 cm (5\' 7" )  Wt 75.5 kg (166 lb 6.4 oz)  LMP (LMP Unknown)  SpO2 98%  BMI 26.06 kg/m  Ht:170.2 cm (5\' 7" ) Wt:75.5 kg (166 lb 6.4 oz) JSH:FWYO surface area is 1.89 meters squared. Body mass index is 26.06 kg/m.  General: Well developed, well nourished. In no acute distress HEENT: Pupils equally reactive to light and accomodation  Neck: Supple without thyromegaly, or goiter. Carotid pulses 2+. No carotid bruits present.  Pulmonary: Clear to auscultation bilaterally; no wheezes, rales, rhonchi Cardiovascular: Regular rate and rhythm. No gallops, murmurs or rubs Gastrointestinal: Soft nontender, nondistended, with normal bowel sounds Extremities: No cyanosis, clubbing, or edema Peripheral Pulses: 2+ in upper extremities, 2+ in lower extremities  Neurology: Alert and oriented X3 Pysch: Good affect. Responds appropriately  Assessment and Plan   59 y.o. female with  1. Coronary artery disease involving autologous vein coronary bypass graft without angina pectoris  -Will d/c digoxin as it's no longer indicated  -Currently on aspirin and Plavix due to extensive peripheral vascular disease  -Continue atorvastatin 20mg  daily, losartan 50mg  daily  2. Atherosclerosis of native artery of extremity with intermittent claudication, unspecified extremity (CMS-HCC)   -Extensive history, closely followed by vascular  -Currently on dual antiplatelet therapy of aspirin and Plavix  3. Controlled type 2 DM with peripheral circulatory disorder (CMS-HCC)  -Continue current medications with routine f/u with PCP  4. Hyperlipidemia, mixed  -Continue atorvastatin 20mg  daily     Orders Placed This Encounter  Procedures  . ECG 12-lead   Return in about 1 year (around 10/09/2020).  I personally performed the service, non-incident to. ([place])   Laura Julian Reed, Laura Reed    Electronically signed by Laura Comfort, Laura Reed at 10/10/2019 1:02 PM EDT  Plan of Treatment - documented as of this encounter Upcoming Encounters Upcoming Encounters  Date Type Specialty Care Team Description  11/08/2019 Office Visit [place], [place], [place]  [place], [place] [address]  [phone]  [phone] (Fax)    Procedures - documented in this encounter Procedure Name Priority Date/Time Associated Diagnosis Comments  PR ELECTROCARDIOGRAM, COMPLETE Routine 10/10/2019 12:06 PM EDT Coronary artery disease involving autologous vein coronary bypass graft without angina pectoris  Results for this procedure are in the results section.   ECG Results - documented in this encounter  ECG 12-lead (10/10/2019 12:06 PM EDT) ECG 12-lead (10/10/2019 12:06 PM EDT)  Component Value Ref Range Performed At Pathologist Signature  Vent Rate (bpm) 70  [facility] GE MUSE RESULTS   PR Interval (msec) 190  [facility] GE MUSE RESULTS   QRS Interval (msec) 104  [facility] GE MUSE RESULTS  QT Interval (msec) 414  [facility] GE MUSE RESULTS   QTc (msec) 447  [facility] GE MUSE RESULTS    ECG 12-lead (10/10/2019 12:06 PM EDT)  Specimen     ECG 12-lead (10/10/2019 12:06 PM EDT)  Narrative Performed At  This result has an attachment that is not available.  Normal sinus rhythm Left axis deviation Incomplete right bundle branch block   When compared with ECG of 08-Sep-2017  15:11, No significant change was found I reviewed and concur with this report. Electronically signed JH:ERDE MD, KEN [phone]) on 10/11/2019 4:51:12 PM   [facility] GE MUSE RESULTS    ECG 12-lead (10/10/2019 12:06 PM EDT)  Performing Organization Address City/State/ZIP Code Phone Number  [place]       Visit Diagnoses - documented in this encounter Diagnosis  Coronary artery disease involving autologous vein coronary bypass graft without angina pectoris - Primary   Atherosclerosis of native artery of extremity with intermittent claudication, unspecified extremity (CMS-HCC)   Controlled type 2 DM with peripheral circulatory disorder (CMS-HCC)   Hyperlipidemia, mixed  Mixed hyperlipidemia   Hypertension, essential  Unspecified essential hypertension   Discontinued Medications - documented as of this encounter Medication Sig Discontinue Reason Start Date End Date  digoxin (LANOXIN) 0.125 MG tablet  Take 0.125 mg by mouth once daily. No longer indicated  10/10/2019  Historical Medications - added in this encounter This list may reflect changes made after this encounter.  Medication Sig Dispensed Refills Start Date End Date  cholecalciferol (VITAMIN D3) 2,000 unit capsule  Take 2,000 Units by mouth once daily  0    Images Patient Demographics  Patient Address Communication Language Race / Ethnicity Marital Status  [address] [place], [place] [address] [phone] [facility]) [phone] (Mobile) [email or web address] (Preferred) White / Not Hispanic or Latino Legally Separated  Patient Contacts  Contact Name Contact Address Communication Relationship to Patient  Dulce Sellar Unknown [phone] (Mobile) Brother or Sister, Emergency Contact  Document Information  Primary Care Provider Other Service Providers Document Coverage Dates  Laura Hick, MD (May 29, 2012May 29, 2012 - Present) [phone]  (Work) [phone] [facility]) [address] [place], [place] [address] Internal Medicine    Apr. 05, 2021April 05, 2021 - Apr. 09, 2021April 09, 2021   [place] [address] [place], [place] [address]   Encounter Providers Encounter Date  Laura Reed, [place] (Attending) [phone] (Work) [phone]) [place], [place] [address] Cardiovascular Disease Apr. 05, 2021April 05, 2021 - Apr. 09, 2021April 09, 2021    Show All Sections

## 2020-03-09 ENCOUNTER — Other Ambulatory Visit: Payer: Medicare Other

## 2020-03-09 DIAGNOSIS — N329 Bladder disorder, unspecified: Secondary | ICD-10-CM | POA: Diagnosis not present

## 2020-03-09 DIAGNOSIS — Z8551 Personal history of malignant neoplasm of bladder: Secondary | ICD-10-CM | POA: Diagnosis not present

## 2020-03-09 LAB — URINALYSIS, COMPLETE
Bilirubin, UA: NEGATIVE
Glucose, UA: NEGATIVE
Ketones, UA: NEGATIVE
Leukocytes,UA: NEGATIVE
Nitrite, UA: NEGATIVE
Protein,UA: NEGATIVE
RBC, UA: NEGATIVE
Specific Gravity, UA: 1.015 (ref 1.005–1.030)
Urobilinogen, Ur: 0.2 mg/dL (ref 0.2–1.0)
pH, UA: 5.5 (ref 5.0–7.5)

## 2020-03-09 LAB — MICROSCOPIC EXAMINATION

## 2020-03-09 NOTE — Progress Notes (Addendum)
[facility] Perioperative [facility]  Pre-Admission/Anesthesia Testing Clinical Review  Date: 03/09/20  Patient Demographics:  Name: Laura Reed DOB:   1960/10/13 MRN:   [id number]  Planned Surgical Procedure(s):    Case: [id number] Date/Time: 03/19/20 1018   Procedures:      CYSTOSCOPY WITH BIOPSY (N/A )     CYSTOSCOPY WITH RETROGRADE PYELOGRAM (Bilateral )   Anesthesia type: General   Pre-op diagnosis: bladder lesion, history of bladder cancer   Location: [facility] OR ROOM 10 / [place] [facility] FOR ANESTHESIA GROUP   Surgeons: Hollice Espy, MD     NOTE: Available PAT nursing documentation and vital signs have been reviewed. Clinical nursing staff has updated patient's PMH/PSHx, current medication list, and drug allergies/intolerances to ensure comprehensive history available to assist in medical decision making as it pertains to the aforementioned surgical procedure and anticipated anesthetic course.   Clinical Discussion:  Laura Reed is a 59 y.o. female who is submitted for pre-surgical anesthesia review and clearance prior to her undergoing the above procedure. Patient is a Former Smoker (70 pack years; quit 03/2013). Pertinent PMH includes: CAD (status post CABG in 2010), CHF, heart murmur, carotid stenosis, HTN, HLD, T2DM, GERD (OTC interventions), urothelial carcinoma (s/p LEFT nephrectomy), OA, MRSA (2007), diabetic nephropathy with proteinuria, insomnia, anxiety (on BZ0)  Patient is followed by cardiology Ubaldo Glassing, MD). She was last seen in the cardiology clinic on 10/10/2019; notes reviewed.  At the time of the clinic visit, patient was doing well from a cardiac standpoint.  Patient denied any chest pain, palpitations, shortness of breath, peripheral edema, orthopnea, PND, or presyncope/syncope.  Patient with significant CAD and is s/p CABG in [place] done in 2010.  Patient underwent carotid endarterectomy in 2015.  Patient with significant PVD resulting in  stent placement to her left popliteal artery in 2019, a right femoropopliteal bypass in 2009, and a right iliac artery stent placement in 2009.  Patient has had several toe amputations due to her PVD; patient followed by vascular Delana Meyer, MD).  EKG in the clinic today revealed NSR at rate of 70 bpm.  Last heart catheterization in 2014 revealed patent grafts.  Most recent TTE revealed an LVEF of 55-60% with no RWMA's.  HTN well controlled on CCB and ARB therapies.  Patient takes statin for HLD.  Patient compliant with prescribed DAPT therapy (ASA + clopidogrel).  Patient stable overall and was scheduled for a 1 year follow-up.  Per cardiology, patient may proceed with planned procedure with a MODERATE risk stratification.  Patient plan to undergo urological procedure on 03/19/2020.  Given patient's age and significant cardiac history, presurgical cardiac clearance was requested by attending surgeon. This patient is on daily DAPT therapy. She has been instructed on recommendations for holding her clopidogrel and ASA for 5 days prior to her procedure.The patient has been instructed that her last doses of her ASA and clopidogrel will be on 03/13/2020.  She reports previous perioperative complications with anesthesia.  Patient reports that her blood pressure runs low after surgery, and following her last lower extremity vascular stent placement, her lungs "filled up with fluid".  Patient also has a (+) family history of PONV in a first-degree relative (sister). She underwent a MAC anesthetic course here (ASA III) in 12/25/2019 with no documented complications.   Vitals with BMI 02/28/2020 12/28/2019 12/06/2019  Height _0  5' 6.5" -  Weight 164 lbs 168 lbs 6 oz -  BMI 31.54 00.86 -  Systolic 761 950 89  Diastolic 56 72 49  Pulse 82 83 64    Providers/Specialists:   NOTE: Primary physician provider listed below. Patient may have been seen by APP or partner within same practice.   PROVIDER ROLE LAST Lu Duffel, MD Urology (Surgeon) 02/28/2020  [name], MD Primary Care Provider 12/28/2019  Bartholome Bill, MD Cardiology 10/10/2019   Allergies:  Patient has no known allergies.  Current Home Medications:   No current facility-administered medications for this encounter.   Marland Kitchen acetaminophen (TYLENOL) 500 MG tablet  . ALPRAZolam (XANAX) 0.5 MG tablet  . amLODipine (NORVASC) 5 MG tablet  . aspirin 81 MG chewable tablet  . atorvastatin (LIPITOR) 20 MG tablet  . Cholecalciferol (VITAMIN D3) 2000 UNITS TABS  . clopidogrel (PLAVIX) 75 MG tablet  . gabapentin (NEURONTIN) 600 MG tablet  . insulin degludec (TRESIBA FLEXTOUCH) 100 UNIT/ML FlexTouch Pen  . Insulin Pen Needle (PEN NEEDLES) 32G X 4 MM MISC  . Lancets Misc. MISC  . losartan (COZAAR) 50 MG tablet  . Semaglutide, 1 MG/DOSE, (OZEMPIC, 1 MG/DOSE,) 4 MG/3ML SOPN   History:   Past Medical History:  Diagnosis Date  . Absence of kidney    left  . Anxiety   . Arthritis   . Bladder cancer ([place])   . CHF (congestive heart failure) ([place])   . Complication of anesthesia    BP HAS  RUN LOW AFTER SURGERY-LUNGS FILLED UP WITH FLUID AFTER  LEG STENT SURGERY   . Coronary artery disease   . Diabetes mellitus   . Family history of adverse reaction to anesthesia    Sister - PONV  . GERD (gastroesophageal reflux disease)    OCC TUMS  . Heart murmur   . Hemorrhoid   . History of methicillin resistant staphylococcus aureus (MRSA) 2007  . Hypertension   . Neuropathy   . PVD (peripheral vascular disease) ([place])   . Thyroid nodule    right  . Urothelial carcinoma of kidney ([place]) 10/31/2014   INVASIVE UROTHELIAL CARCINOMA, LOW GRADE. T1, Nx.  . Wears dentures    full upper and lower   Past Surgical History:  Procedure Laterality Date  . AMPUTATION TOE     right (4th and 5th); left (great toe, 3rd)  . AMPUTATION TOE Right 07/16/2018   Procedure: AMPUTATION TOE/MPJ right 2nd;  Surgeon: Sharlotte Alamo, DPM;  Location: [facility] ORS;   Service: Podiatry;  Laterality: Right;  . ARTERIAL BYPASS SURGRY  2009, 2013 x 2   right leg , done in [place]  . CARDIAC CATHETERIZATION    . CAROTID ENDARTERECTOMY Right 01/2014   Dr Delana Meyer  . CATARACT EXTRACTION W/PHACO Right 12/14/2014   Procedure: CATARACT EXTRACTION PHACO AND INTRAOCULAR LENS PLACEMENT (IOC);  Surgeon: Lyla Glassing, MD;  Location: [facility] ORS;  Service: Ophthalmology;  Laterality: Right;  [place]   00:38.6              AP        7.1                   CDE  2.76  . CATARACT EXTRACTION W/PHACO Left 12/06/2019   Procedure: CATARACT EXTRACTION PHACO AND INTRAOCULAR LENS PLACEMENT ([place]) LEFT DIABETIC;  Surgeon: Birder Robson, MD;  Location: [place];  Service: Ophthalmology;  Laterality: Left;  9.08 1:06.4  . CESAREAN SECTION    . CHOLECYSTECTOMY  03-03-12   Porcelain gallbladder, gallstones,  [place]  . COLONOSCOPY W/ BIOPSIES  04/28/2012   Hyperplastic rectal polyps.  Marland Kitchen  CORONARY ARTERY BYPASS GRAFT  2009   3 vessel  . CYSTOSCOPY W/ RETROGRADES Right 09/01/2016   Procedure: CYSTOSCOPY WITH RETROGRADE PYELOGRAM;  Surgeon: Hollice Espy, MD;  Location: [facility] ORS;  Service: Urology;  Laterality: Right;  . EYE SURGERY    . HERNIA REPAIR  10-31-14   ventral, retro-rectus atrium mesh  . IRRIGATION AND DEBRIDEMENT FOOT Left 01/18/2019   Procedure: IRRIGATION AND DEBRIDEMENT FOOT;  Surgeon: Sharlotte Alamo, DPM;  Location: [facility] ORS;  Service: Podiatry;  Laterality: Left;  . LOWER EXTREMITY ANGIOGRAPHY Left 12/10/2016   Procedure: Lower Extremity Angiography;  Surgeon: Katha Cabal, MD;  Location: [place] [facility];  Service: Cardiovascular;  Laterality: Left;  . LOWER EXTREMITY ANGIOGRAPHY Left 02/02/2018   Procedure: LOWER EXTREMITY ANGIOGRAPHY;  Surgeon: Katha Cabal, MD;  Location: [place] [facility];  Service: Cardiovascular;  Laterality: Left;  . LOWER EXTREMITY ANGIOGRAPHY Left 05/05/2018   Procedure: LOWER EXTREMITY ANGIOGRAPHY;  Surgeon: Katha Cabal, MD;  Location: [place] [facility];  Service: Cardiovascular;  Laterality: Left;  . NEPHRECTOMY Left 10-31-14  . PERIPHERAL VASCULAR CATHETERIZATION Left 05/01/2015   Procedure: Lower Extremity Angiography;  Surgeon: Katha Cabal, MD;  Location: [place] [facility];  Service: Cardiovascular;  Laterality: Left;  . PERIPHERAL VASCULAR CATHETERIZATION  05/01/2015   Procedure: Lower Extremity Intervention;  Surgeon: Katha Cabal, MD;  Location: [place] [facility];  Service: Cardiovascular;;  . PERIPHERAL VASCULAR CATHETERIZATION Left 02/20/2015   Procedure: Pelvic Angiography;  Surgeon: Katha Cabal, MD;  Location: [place] [facility];  Service: Cardiovascular;  Laterality: Left;  . TRANSURETHRAL RESECTION OF BLADDER TUMOR WITH MITOMYCIN-C N/A 09/01/2016   Procedure: TRANSURETHRAL RESECTION OF BLADDER TUMOR WITH MITOMYCIN-C;  Surgeon: Hollice Espy, MD;  Location: [facility] ORS;  Service: Urology;  Laterality: N/A;   Family History  Problem Relation Age of Onset  . Cancer Mother 45       Lung Cancer  . Cancer Father 22       Lung Ca  . Diabetes Son   . Breast cancer Maternal Grandmother   . Kidney cancer Neg Hx   . Bladder Cancer Neg Hx   . Prostate cancer Neg Hx    Social History   Tobacco Use  . Smoking status: Former Smoker    Packs/day: 2.00    Years: 35.00    Pack years: 70.00    Types: Cigarettes    Quit date: 03/29/2013    Years since quitting: 6.9  . Smokeless tobacco: Never Used  Vaping Use  . Vaping Use: Former  Substance Use Topics  . Alcohol use: Not Currently    Alcohol/week: 0.0 standard drinks    Comment: LAST DRINK 2009  . Drug use: Not Currently    Types: Cocaine    Comment: last used in 2007    Pertinent Clinical Results:  LABS:    Pending additional labs (CBC and repeat sodium) on 03/15/2020.  We will follow-up on lab values to ensure that they are within acceptable range to proceed with planned surgical intervention.  No visits with  results within 3 Day(s) from this visit.  Latest known visit with results is:  Procedure visit on 02/28/2020  Component Date Value Ref Range Status  . Specific Gravity, UA 02/28/2020 1.010  1.005 - 1.030 Final  . pH, UA 02/28/2020 6.0  5.0 - 7.5 Final  . Color, UA 02/28/2020 Yellow  Yellow Final  . Appearance Ur 02/28/2020 Clear  Clear Final  .  Leukocytes,UA 02/28/2020 Negative  Negative Final  . Protein,UA 02/28/2020 Negative  Negative/Trace Final  . Glucose, UA 02/28/2020 Trace* Negative Final  . Ketones, UA 02/28/2020 Negative  Negative Final  . RBC, UA 02/28/2020 Negative  Negative Final  . Bilirubin, UA 02/28/2020 Negative  Negative Final  . Urobilinogen, Ur 02/28/2020 0.2  0.2 - 1.0 mg/dL Final  . Nitrite, UA 02/28/2020 Negative  Negative Final  . Microscopic Examination 02/28/2020 See below:   Final  . Urine Culture, Comprehensive 02/28/2020 Final report   Final  . Organism ID, Bacteria 02/28/2020 Comment   Final   Comment: Mixed urogenital flora 10,000-25,000 colony forming units per mL   . WBC, UA 02/28/2020 0-5  0 - 5 /hpf Final  . RBC 02/28/2020 None seen  0 - 2 /hpf Final  . Epithelial Cells (non renal) 02/28/2020 0-10  0 - 10 /hpf Final  . Bacteria, UA 02/28/2020 None seen  None seen/Few Final    ECG: Date: 10/10/2019  Rate: 70 bpm Rhythm: normal sinus; IRBBB Intervals: PR 190 ms. QRS 104 ms. QTc 447 ms. ST segment and T wave changes: No evidence of acute ST segment elevation or depression Comparison: Similar to previous tracing obtained on 09/08/2017 NOTE: Tracing obtained at [facility]; unable for review. Above based on cardiologist's interpretation.    IMAGING / PROCEDURES: -None available for review  Impression and Plan:  Laura Reed has been referred for pre-anesthesia review and clearance prior her undergoing the planned anesthetic and procedural courses. Available labs, pertinent testing, and imaging results were personally reviewed by me. This  patient has been appropriately cleared by cardiology.   Based on clinical review performed today (03/09/20), barring any significant acute changes in the patient's overall condition, it is anticipated that she will be able to proceed with the planned surgical intervention. Any acute changes in clinical condition may necessitate her procedure being postponed and/or cancelled. Pre-surgical instructions were reviewed with the patient during her PAT appointment and questions were fielded by PAT clinical staff.  Honor Loh, MSN, APRN, FNP-C, CEN [facility]  Peri-operative Services Nurse Practitioner Phone: [phone] 03/09/20 11:10 AM  NOTE: This note has been prepared using Dragon dictation software. Despite my best ability to proofread, there is always the potential that unintentional transcriptional errors may still occur from this process.

## 2020-03-12 LAB — CULTURE, URINE COMPREHENSIVE

## 2020-03-15 ENCOUNTER — Other Ambulatory Visit: Payer: Self-pay

## 2020-03-15 ENCOUNTER — Other Ambulatory Visit
Admission: RE | Admit: 2020-03-15 | Discharge: 2020-03-15 | Disposition: A | Discharge status: Home / Self Care | Payer: Medicare Other | Source: Ambulatory Visit | Attending: Urology | Admitting: Urology

## 2020-03-15 DIAGNOSIS — Z01818 Encounter for other preprocedural examination: Secondary | ICD-10-CM | POA: Diagnosis not present

## 2020-03-15 DIAGNOSIS — Z20822 Contact with and (suspected) exposure to covid-19: Secondary | ICD-10-CM | POA: Insufficient documentation

## 2020-03-15 LAB — CBC
HCT: 37.4 % (ref 36.0–46.0)
Hemoglobin: 13.6 g/dL (ref 12.0–15.0)
MCH: 29.6 pg (ref 26.0–34.0)
MCHC: 36.4 g/dL — ABNORMAL HIGH (ref 30.0–36.0)
MCV: 81.3 fL (ref 80.0–100.0)
Platelets: 274 10*3/uL (ref 150–400)
RBC: 4.6 MIL/uL (ref 3.87–5.11)
RDW: 13.1 % (ref 11.5–15.5)
WBC: 9.2 10*3/uL (ref 4.0–10.5)
nRBC: 0 % (ref 0.0–0.2)

## 2020-03-16 LAB — SARS CORONAVIRUS 2 (TAT 6-24 HRS): SARS Coronavirus 2: NEGATIVE

## 2020-03-17 ENCOUNTER — Other Ambulatory Visit: Payer: Self-pay | Admitting: Internal Medicine

## 2020-03-17 DIAGNOSIS — Z794 Long term (current) use of insulin: Secondary | ICD-10-CM

## 2020-03-19 ENCOUNTER — Other Ambulatory Visit: Payer: Self-pay

## 2020-03-19 ENCOUNTER — Encounter: Payer: Self-pay | Admitting: Urology

## 2020-03-19 ENCOUNTER — Ambulatory Visit: Payer: Medicare Other | Admitting: Urgent Care

## 2020-03-19 ENCOUNTER — Encounter
Admission: RE | Disposition: A | Discharge status: Home / Self Care | Payer: Self-pay | Source: Home / Self Care | Attending: Urology

## 2020-03-19 ENCOUNTER — Ambulatory Visit: Payer: Medicare Other

## 2020-03-19 ENCOUNTER — Ambulatory Visit
Admission: RE | Admit: 2020-03-19 | Discharge: 2020-03-19 | Disposition: A | Discharge status: Home / Self Care | Payer: Medicare Other | Attending: Urology | Admitting: Urology

## 2020-03-19 DIAGNOSIS — I2510 Atherosclerotic heart disease of native coronary artery without angina pectoris: Secondary | ICD-10-CM | POA: Insufficient documentation

## 2020-03-19 DIAGNOSIS — I509 Heart failure, unspecified: Secondary | ICD-10-CM | POA: Insufficient documentation

## 2020-03-19 DIAGNOSIS — N3289 Other specified disorders of bladder: Secondary | ICD-10-CM | POA: Diagnosis not present

## 2020-03-19 DIAGNOSIS — K219 Gastro-esophageal reflux disease without esophagitis: Secondary | ICD-10-CM | POA: Insufficient documentation

## 2020-03-19 DIAGNOSIS — Z833 Family history of diabetes mellitus: Secondary | ICD-10-CM | POA: Diagnosis not present

## 2020-03-19 DIAGNOSIS — Z8614 Personal history of Methicillin resistant Staphylococcus aureus infection: Secondary | ICD-10-CM | POA: Insufficient documentation

## 2020-03-19 DIAGNOSIS — Z7982 Long term (current) use of aspirin: Secondary | ICD-10-CM | POA: Diagnosis not present

## 2020-03-19 DIAGNOSIS — F419 Anxiety disorder, unspecified: Secondary | ICD-10-CM | POA: Diagnosis not present

## 2020-03-19 DIAGNOSIS — Z87440 Personal history of urinary (tract) infections: Secondary | ICD-10-CM | POA: Diagnosis not present

## 2020-03-19 DIAGNOSIS — N3030 Trigonitis without hematuria: Secondary | ICD-10-CM | POA: Insufficient documentation

## 2020-03-19 DIAGNOSIS — Z951 Presence of aortocoronary bypass graft: Secondary | ICD-10-CM | POA: Diagnosis not present

## 2020-03-19 DIAGNOSIS — Z79899 Other long term (current) drug therapy: Secondary | ICD-10-CM | POA: Insufficient documentation

## 2020-03-19 DIAGNOSIS — N329 Bladder disorder, unspecified: Secondary | ICD-10-CM

## 2020-03-19 DIAGNOSIS — E785 Hyperlipidemia, unspecified: Secondary | ICD-10-CM | POA: Insufficient documentation

## 2020-03-19 DIAGNOSIS — Z7902 Long term (current) use of antithrombotics/antiplatelets: Secondary | ICD-10-CM | POA: Insufficient documentation

## 2020-03-19 DIAGNOSIS — Z87891 Personal history of nicotine dependence: Secondary | ICD-10-CM | POA: Diagnosis not present

## 2020-03-19 DIAGNOSIS — Z794 Long term (current) use of insulin: Secondary | ICD-10-CM | POA: Diagnosis not present

## 2020-03-19 DIAGNOSIS — Z85528 Personal history of other malignant neoplasm of kidney: Secondary | ICD-10-CM | POA: Insufficient documentation

## 2020-03-19 DIAGNOSIS — E1121 Type 2 diabetes mellitus with diabetic nephropathy: Secondary | ICD-10-CM | POA: Insufficient documentation

## 2020-03-19 DIAGNOSIS — Z8551 Personal history of malignant neoplasm of bladder: Secondary | ICD-10-CM | POA: Insufficient documentation

## 2020-03-19 DIAGNOSIS — E1151 Type 2 diabetes mellitus with diabetic peripheral angiopathy without gangrene: Secondary | ICD-10-CM | POA: Diagnosis not present

## 2020-03-19 DIAGNOSIS — I110 Hypertensive heart disease with heart failure: Secondary | ICD-10-CM | POA: Diagnosis not present

## 2020-03-19 DIAGNOSIS — D414 Neoplasm of uncertain behavior of bladder: Secondary | ICD-10-CM | POA: Diagnosis not present

## 2020-03-19 DIAGNOSIS — Z905 Acquired absence of kidney: Secondary | ICD-10-CM | POA: Diagnosis not present

## 2020-03-19 DIAGNOSIS — M1990 Unspecified osteoarthritis, unspecified site: Secondary | ICD-10-CM | POA: Insufficient documentation

## 2020-03-19 HISTORY — PX: CYSTOSCOPY WITH BIOPSY: SHX5122

## 2020-03-19 HISTORY — PX: CYSTOSCOPY W/ RETROGRADES: SHX1426

## 2020-03-19 LAB — GLUCOSE, CAPILLARY
Glucose-Capillary: 173 mg/dL — ABNORMAL HIGH (ref 70–99)
Glucose-Capillary: 193 mg/dL — ABNORMAL HIGH (ref 70–99)

## 2020-03-19 LAB — SODIUM: Sodium: 132 mmol/L — ABNORMAL LOW (ref 135–145)

## 2020-03-19 SURGERY — CYSTOSCOPY, WITH BIOPSY
Anesthesia: General

## 2020-03-19 MED ORDER — FENTANYL CITRATE (PF) 100 MCG/2ML IJ SOLN
INTRAMUSCULAR | Status: AC
Start: 2020-03-19 — End: ?
  Filled 2020-03-19: qty 2

## 2020-03-19 MED ORDER — PROPOFOL 10 MG/ML IV BOLUS
INTRAVENOUS | Status: DC | PRN
  Administered 2020-03-19: 110 mg via INTRAVENOUS
  Administered 2020-03-19: 40 mg via INTRAVENOUS

## 2020-03-19 MED ORDER — CHLORHEXIDINE GLUCONATE 0.12 % MT SOLN
OROMUCOSAL | Status: AC
  Administered 2020-03-19: 15 mL via OROMUCOSAL
  Filled 2020-03-19: qty 15

## 2020-03-19 MED ORDER — MIDAZOLAM HCL 2 MG/2ML IJ SOLN
INTRAMUSCULAR | Status: DC | PRN
  Administered 2020-03-19: 2 mg via INTRAVENOUS

## 2020-03-19 MED ORDER — EPHEDRINE SULFATE 50 MG/ML IJ SOLN
INTRAMUSCULAR | Status: DC | PRN
  Administered 2020-03-19: 15 mg via INTRAVENOUS

## 2020-03-19 MED ORDER — LIDOCAINE HCL (CARDIAC) PF 100 MG/5ML IV SOSY
PREFILLED_SYRINGE | INTRAVENOUS | Status: DC | PRN
  Administered 2020-03-19: 80 mg via INTRAVENOUS

## 2020-03-19 MED ORDER — DEXAMETHASONE SODIUM PHOSPHATE 10 MG/ML IJ SOLN
INTRAMUSCULAR | Status: DC | PRN
  Administered 2020-03-19: 10 mg via INTRAVENOUS

## 2020-03-19 MED ORDER — ONDANSETRON HCL 4 MG/2ML IJ SOLN: 4.0000 mg | Freq: Once | INTRAMUSCULAR | Status: DC | PRN

## 2020-03-19 MED ORDER — FENTANYL CITRATE (PF) 100 MCG/2ML IJ SOLN: 25.0000 ug | INTRAMUSCULAR | Status: DC | PRN

## 2020-03-19 MED ORDER — ONDANSETRON HCL 4 MG/2ML IJ SOLN
INTRAMUSCULAR | Status: DC | PRN
  Administered 2020-03-19: 4 mg via INTRAVENOUS

## 2020-03-19 MED ORDER — PHENYLEPHRINE HCL (PRESSORS) 10 MG/ML IV SOLN
INTRAVENOUS | Status: DC | PRN
  Administered 2020-03-19: 200 ug via INTRAVENOUS
  Administered 2020-03-19: 100 ug via INTRAVENOUS
  Administered 2020-03-19: 200 ug via INTRAVENOUS

## 2020-03-19 MED ORDER — CEFAZOLIN SODIUM-DEXTROSE 2-4 GM/100ML-% IV SOLN
INTRAVENOUS | Status: AC
  Filled 2020-03-19: qty 100

## 2020-03-19 MED ORDER — FAMOTIDINE 20 MG PO TABS
ORAL_TABLET | ORAL | Status: AC
  Administered 2020-03-19: 20 mg via ORAL
  Filled 2020-03-19: qty 1

## 2020-03-19 MED ORDER — SODIUM CHLORIDE 0.9 % IV SOLN: INTRAVENOUS | Status: DC

## 2020-03-19 MED ORDER — CHLORHEXIDINE GLUCONATE 0.12 % MT SOLN: 15.0000 mL | Freq: Once | OROMUCOSAL | Status: AC

## 2020-03-19 MED ORDER — FAMOTIDINE 20 MG PO TABS: 20.0000 mg | ORAL_TABLET | Freq: Once | ORAL | Status: AC

## 2020-03-19 MED ORDER — FENTANYL CITRATE (PF) 100 MCG/2ML IJ SOLN
INTRAMUSCULAR | Status: DC | PRN
Start: 2020-03-19 — End: 2020-03-19
  Administered 2020-03-19 (×2): 25 ug via INTRAVENOUS

## 2020-03-19 MED ORDER — IOHEXOL 180 MG/ML  SOLN
INTRAMUSCULAR | Status: DC | PRN
  Administered 2020-03-19: 20 mL

## 2020-03-19 MED ORDER — MIDAZOLAM HCL 2 MG/2ML IJ SOLN
INTRAMUSCULAR | Status: AC
  Filled 2020-03-19: qty 2

## 2020-03-19 MED ORDER — ORAL CARE MOUTH RINSE: 15.0000 mL | Freq: Once | OROMUCOSAL | Status: AC

## 2020-03-19 MED ORDER — CEFAZOLIN SODIUM-DEXTROSE 2-4 GM/100ML-% IV SOLN
2.0000 g | INTRAVENOUS | Status: AC
  Administered 2020-03-19: 2 g via INTRAVENOUS

## 2020-03-19 MED ORDER — GLYCOPYRROLATE 0.2 MG/ML IJ SOLN
INTRAMUSCULAR | Status: DC | PRN
  Administered 2020-03-19: .2 mg via INTRAVENOUS

## 2020-03-19 SURGICAL SUPPLY — 25 items
BAG DRAIN CYSTO-URO LG1000N (MISCELLANEOUS) ×4 IMPLANT
BRUSH SCRUB EZ  4% CHG (MISCELLANEOUS) ×2
BRUSH SCRUB EZ 1% IODOPHOR (MISCELLANEOUS) ×4 IMPLANT
BRUSH SCRUB EZ 4% CHG (MISCELLANEOUS) ×2 IMPLANT
CATH URETL 5X70 OPEN END (CATHETERS) ×4 IMPLANT
DRAPE UTILITY 15X26 TOWEL STRL (DRAPES) ×4 IMPLANT
DRSG TELFA 4X3 1S NADH ST (GAUZE/BANDAGES/DRESSINGS) ×4 IMPLANT
ELECT REM PT RETURN 9FT ADLT (ELECTROSURGICAL) ×4
ELECTRODE REM PT RTRN 9FT ADLT (ELECTROSURGICAL) ×2 IMPLANT
GLOVE BIO SURGEON STRL SZ 6.5 (GLOVE) ×3 IMPLANT
GLOVE BIO SURGEONS STRL SZ 6.5 (GLOVE) ×1
GOWN STRL REUS W/ TWL LRG LVL3 (GOWN DISPOSABLE) ×4 IMPLANT
GOWN STRL REUS W/TWL LRG LVL3 (GOWN DISPOSABLE) ×8
GUIDEWIRE STR DUAL SENSOR (WIRE) ×4 IMPLANT
KIT TURNOVER CYSTO (KITS) ×4 IMPLANT
NDL SAFETY ECLIPSE 18X1.5 (NEEDLE) ×2 IMPLANT
NEEDLE HYPO 18GX1.5 SHARP (NEEDLE) ×4
PACK CYSTO AR (MISCELLANEOUS) ×4 IMPLANT
SET CYSTO W/LG BORE CLAMP LF (SET/KITS/TRAYS/PACK) ×4 IMPLANT
SOL .9 NS 3000ML IRR  AL (IV SOLUTION) ×2
SOL .9 NS 3000ML IRR AL (IV SOLUTION) ×2
SOL .9 NS 3000ML IRR UROMATIC (IV SOLUTION) ×2 IMPLANT
SURGILUBE 2OZ TUBE FLIPTOP (MISCELLANEOUS) ×4 IMPLANT
WATER STERILE IRR 1000ML POUR (IV SOLUTION) ×4 IMPLANT
WATER STERILE IRR 3000ML UROMA (IV SOLUTION) ×4 IMPLANT

## 2020-03-19 NOTE — Transfer of Care (Signed)
Immediate Anesthesia Transfer of Care Note  Patient: CARRIN VANNOSTRAND  Procedure(s) Performed: CYSTOSCOPY WITH BIOPSY (N/A ) CYSTOSCOPY WITH RETROGRADE PYELOGRAM (Bilateral )  Patient Location: PACU  Anesthesia Type:General  Level of Consciousness: awake, drowsy and patient cooperative  Airway & Oxygen Therapy: Patient Spontanous Breathing and Patient connected to face mask oxygen  Post-op Assessment: Report given to RN and Post -op Vital signs reviewed and stable  Post vital signs: Reviewed and stable  Last Vitals:  Vitals Value Taken Time  BP 127/83 03/19/20 1005  Temp 36.8 C 03/19/20 1005  Pulse 105 03/19/20 1007  Resp 12 03/19/20 1007  SpO2 100 % 03/19/20 1007  Vitals shown include unvalidated device data.  Last Pain:  Vitals:   03/19/20 1005  TempSrc:   PainSc: 0-No pain         Complications: No complications documented.

## 2020-03-19 NOTE — Op Note (Signed)
Date of procedure: 03/19/20  Preoperative diagnosis:  1. Bladder lesion 2. History of bladder cancer 3. History of left upper tract urothelial carcinoma status post nephroureterectomy  Postoperative diagnosis:  1. Same as above  Procedure: 1. Cystoscopy 2. Bilateral retrograde pyelogram 3. Bladder biopsy  Surgeon: Hollice Espy, MD  Anesthesia: General  Complications: None  Intraoperative findings: Mild slightly texture rise velvety erythema in posterior bladder wall/ dome, multifocal measuring approximately 1 cm each  EBL: Minimal  Specimens: Bladder biopsy, dome/posterior bladder wall  Drains: None  Indication: Laura Reed is a 59 y.o. patient with personal history of urothelial carcinoma including left upper tract urothelial carcinoma status post nephroureterectomy as well as recurrent bladder cancer.  On office cystoscopy, she was found to have patchy velvety erythema in the posterior bladder wall this was counseled to undergo bladder biopsy.  After reviewing the management options for treatment, she elected to proceed with the above surgical procedure(s). We have discussed the potential benefits and risks of the procedure, side effects of the proposed treatment, the likelihood of the patient achieving the goals of the procedure, and any potential problems that might occur during the procedure or recuperation. Informed consent has been obtained.  Description of procedure:  The patient was taken to the operating room and general anesthesia was induced.  The patient was placed in the dorsal lithotomy position, prepped and draped in the usual sterile fashion, and preoperative antibiotics were administered. A preoperative time-out was performed.   A 21 French scope was advanced per urethra into the bladder.  The left UO was stenotic but I was able to intubate just within the orifice of the 5 [place] open-ended catheter.  I injected contrast solution and there was prompt E flux  of the contrast material within the bladder, none of which traversed up any sort of ureteral stone.  There was clearly no significant residual ureter stump on the side but a slight opening where the native ureter resided.  Attention was then turned to the right UO.  This was intubated using a 5 [place] open-ended ureteral catheter.  Gentle retrograde pyelogram was performed on this side.  This revealed no hydroureteronephrosis or filling defects.  This was saved to PACS.  Next, the bladder was inspected in entirety.  There is multiple areas on the posterior bladder wall extending up towards the dome which were slightly velvety and erythematous in appearance.  1 of these areas appeared to be in the location of a previous biopsy/scar.  There is no papillary change or any obvious tumors in the remainder the bladder was spared.  Cold cup biopsy forceps were then used to take approximately 5 representative biopsies from the area edematous areas on the posterior bladder wall and dome.  These were passed off the field as bladder biopsies.  Bugbee electrocautery was then used for adequate hemostasis.  Once excellent hemostasis was achieved, the bladder was drained and the scope was removed.  The patient was then cleaned and dried, repositioned in supine position, reversed myesthesia, taken the PACU in stable condition.  Plan: I will call the patient with her pathology results and follow-up plan.  All questions answered.  No additional medications were prescribed.  Hollice Espy, M.D.

## 2020-03-19 NOTE — Anesthesia Postprocedure Evaluation (Signed)
Anesthesia Post Note  Patient: Laura Reed  Procedure(s) Performed: CYSTOSCOPY WITH BIOPSY (N/A ) CYSTOSCOPY WITH RETROGRADE PYELOGRAM (Bilateral )  Patient location during evaluation: PACU Anesthesia Type: General Level of consciousness: awake and alert and oriented Pain management: pain level controlled Vital Signs Assessment: post-procedure vital signs reviewed and stable Respiratory status: spontaneous breathing, nonlabored ventilation and respiratory function stable Cardiovascular status: blood pressure returned to baseline and stable Postop Assessment: no signs of nausea or vomiting Anesthetic complications: no   No complications documented.   Last Vitals:  Vitals:   03/19/20 1040 03/19/20 1055  BP:  (!) 98/52  Pulse:  83  Resp:  18  Temp: (!) 36.2 C (!) 36.2 C  SpO2:  98%    Last Pain:  Vitals:   03/19/20 1055  TempSrc: Temporal  PainSc: 0-No pain                 Ruston Fedora

## 2020-03-19 NOTE — Anesthesia Preprocedure Evaluation (Signed)
Anesthesia Evaluation  Patient identified by MRN, date of birth, ID band Patient awake    Reviewed: Allergy & Precautions, NPO status , Patient's Chart, lab work & pertinent test results  History of Anesthesia Complications Negative for: history of anesthetic complications  Airway Mallampati: II  TM Distance: >3 FB Neck ROM: Full    Dental  (+) Edentulous Upper, Edentulous Lower   Pulmonary neg sleep apnea, neg COPD, former smoker,    breath sounds clear to auscultation- rhonchi (-) wheezing      Cardiovascular hypertension, + CAD, + CABG, + Peripheral Vascular Disease and +CHF  (-) Cardiac Stents  Rhythm:Regular Rate:Normal - Systolic murmurs and - Diastolic murmurs    Neuro/Psych neg Seizures PSYCHIATRIC DISORDERS Anxiety negative neurological ROS     GI/Hepatic Neg liver ROS, GERD  ,  Endo/Other  diabetes, Insulin Dependent  Renal/GU Renal disease: s/p nephrectomy for renal tumor.     Musculoskeletal  (+) Arthritis ,   Abdominal (+) - obese,   Peds  Hematology negative hematology ROS (+)   Anesthesia Other Findings Past Medical History: No date: Absence of kidney     Comment:  left No date: Anxiety No date: Arthritis No date: Bladder cancer ([facility]) No date: CHF (congestive heart failure) ([facility]) No date: Complication of anesthesia     Comment:  BP HAS  RUN LOW AFTER SURGERY-LUNGS FILLED UP WITH FLUID              AFTER  LEG STENT SURGERY  No date: Coronary artery disease No date: Diabetes mellitus No date: Family history of adverse reaction to anesthesia     Comment:  Sister - PONV No date: GERD (gastroesophageal reflux disease)     Comment:  OCC TUMS No date: Heart murmur No date: Hemorrhoid 2007: History of methicillin resistant staphylococcus aureus (MRSA) No date: Hypertension No date: Neuropathy No date: PVD (peripheral vascular disease) ([place]) No date: Thyroid nodule     Comment:   right 10/31/2014: Urothelial carcinoma of kidney ([place])     Comment:  INVASIVE UROTHELIAL CARCINOMA, LOW GRADE. T1, Nx. No date: Wears dentures     Comment:  full upper and lower   Reproductive/Obstetrics                             Anesthesia Physical Anesthesia Plan  ASA: III  Anesthesia Plan: General   Post-op Pain Management:    Induction: Intravenous  PONV Risk Score and Plan: 2 and Ondansetron and Dexamethasone  Airway Management Planned: LMA  Additional Equipment:   Intra-op Plan:   Post-operative Plan:   Informed Consent: I have reviewed the patients History and Physical, chart, labs and discussed the procedure including the risks, benefits and alternatives for the proposed anesthesia with the patient or authorized representative who has indicated his/her understanding and acceptance.     Dental advisory given  Plan Discussed with: CRNA and Anesthesiologist  Anesthesia Plan Comments:         Anesthesia Quick Evaluation

## 2020-03-19 NOTE — Anesthesia Procedure Notes (Signed)
Procedure Name: LMA Insertion Performed by: Kelton Pillar, CRNA Pre-anesthesia Checklist: Patient identified, Emergency Drugs available, Suction available, Patient being monitored and Timeout performed Patient Re-evaluated:Patient Re-evaluated prior to induction Oxygen Delivery Method: Circle system utilized Preoxygenation: Pre-oxygenation with 100% oxygen Induction Type: IV induction Ventilation: Two handed mask ventilation required and Oral airway inserted - appropriate to patient size LMA: LMA inserted LMA Size: 3.5 Number of attempts: 2 Placement Confirmation: positive ETCO2,  CO2 detector and breath sounds checked- equal and bilateral Tube secured with: Tape Dental Injury: Teeth and Oropharynx as per pre-operative assessment  Comments: LMA #4 Unique unable to seat, LMA AirQ #3.5  inserted w/o incident TFH

## 2020-03-19 NOTE — Interval H&P Note (Signed)
History and Physical Interval Note:  03/19/2020 9:01 AM  Laura Reed  has presented today for surgery, with the diagnosis of bladder lesion, history of bladder cancer.  The various methods of treatment have been discussed with the patient and family. After consideration of risks, benefits and other options for treatment, the patient has consented to  Procedure(s): CYSTOSCOPY WITH BIOPSY (N/A) CYSTOSCOPY WITH RETROGRADE PYELOGRAM (Bilateral) as a surgical intervention.  The patient's history has been reviewed, patient examined, no change in status, stable for surgery.  I have reviewed the patient's chart and labs.  Questions were answered to the patient's satisfaction.    RRR CTAB   Hollice Espy

## 2020-03-19 NOTE — Discharge Instructions (Signed)
Transurethral Resection of Bladder Tumor (TURBT) or Bladder Biopsy ° ° °Definition: ° Transurethral Resection of the Bladder Tumor is a surgical procedure used to diagnose and remove tumors within the bladder. TURBT is the most common treatment for early stage bladder cancer. ° °General instructions: °   ° Your recent bladder surgery requires very little post hospital care but some definite precautions. ° °Despite the fact that no skin incisions were used, the area around the bladder incisions are raw and covered with scabs to promote healing and prevent bleeding. Certain precautions are needed to insure that the scabs are not disturbed over the next 2-4 weeks while the healing proceeds. ° °Because the raw surface inside your bladder and the irritating effects of urine you may expect frequency of urination and/or urgency (a stronger desire to urinate) and perhaps even getting up at night more often. This will usually resolve or improve slowly over the healing period. You may see some blood in your urine over the first 6 weeks. Do not be alarmed, even if the urine was clear for a while. Get off your feet and drink lots of fluids until clearing occurs. If you start to pass clots or don't improve call us. ° °Diet: ° °You may return to your normal diet immediately. Because of the raw surface of your bladder, alcohol, spicy foods, foods high in acid and drinks with caffeine may cause irritation or frequency and should be used in moderation. To keep your urine flowing freely and avoid constipation, drink plenty of fluids during the day (8-10 glasses). Tip: Avoid cranberry juice because it is very acidic. ° °Activity: ° °Your physical activity doesn't need to be restricted. However, if you are very active, you may see some blood in the urine. We suggest that you reduce your activity under the circumstances until the bleeding has stopped. ° °Bowels: ° °It is important to keep your bowels regular during the postoperative  period. Straining with bowel movements can cause bleeding. A bowel movement every other day is reasonable. Use a mild laxative if needed, such as milk of magnesia 2-3 tablespoons, or 2 Dulcolax tablets. Call if you continue to have problems. If you had been taking narcotics for pain, before, during or after your surgery, you may be constipated. Take a laxative if necessary. ° ° ° °Medication: ° °You should resume your pre-surgery medications unless told not to. In addition you may be given an antibiotic to prevent or treat infection. Antibiotics are not always necessary. All medication should be taken as prescribed until the bottles are finished unless you are having an unusual reaction to one of the drugs. ° ° °[facility] °[place],  [address] °[phone] ° ° ° °AMBULATORY SURGERY  °DISCHARGE INSTRUCTIONS ° ° °1) The drugs that you were given will stay in your system until tomorrow so for the next 24 hours you should not: ° °A) Drive an automobile °B) Make any legal decisions °C) Drink any alcoholic beverage ° ° °2) You may resume regular meals tomorrow.  Today it is better to start with liquids and gradually work up to solid foods. ° °You may eat anything you prefer, but it is better to start with liquids, then soup and crackers, and gradually work up to solid foods. ° ° °3) Please notify your doctor immediately if you have any unusual bleeding, trouble breathing, redness and pain at the surgery site, drainage, fever, or pain not relieved by medication. ° ° ° °4) Additional Instructions: ° ° ° ° ° ° ° °  Please contact your physician with any problems or Same Day Surgery at [phone], Monday through Friday 6 am to 4 pm, or [place] [facility] [place] Main number at [phone]. ° °

## 2020-03-20 LAB — SURGICAL PATHOLOGY

## 2020-03-21 ENCOUNTER — Ambulatory Visit: Payer: Medicare Other | Admitting: Pharmacist

## 2020-03-21 ENCOUNTER — Telehealth: Payer: Self-pay

## 2020-03-21 DIAGNOSIS — I739 Peripheral vascular disease, unspecified: Secondary | ICD-10-CM

## 2020-03-21 DIAGNOSIS — E1142 Type 2 diabetes mellitus with diabetic polyneuropathy: Secondary | ICD-10-CM

## 2020-03-21 DIAGNOSIS — Z794 Long term (current) use of insulin: Secondary | ICD-10-CM

## 2020-03-21 MED ORDER — FREESTYLE LIBRE 2 SENSOR MISC: 2 refills | Status: DC

## 2020-03-21 NOTE — Patient Instructions (Signed)
Visit Information  Goals Addressed              This Visit's Progress     Patient Stated     PharmD "I want to control my blood sugars better" (pt-stated)        CARE PLAN ENTRY (see longtitudinal plan of care for additional care plan information)  Current Barriers:   Social, financial, community barriers:  o Calls today with concerns about [place] sensors. Notes that [employer] had down that they shipped 7 sensors, but she believes she only received 6. Had a sensor fall off early, was sent a replacement by [employer] that she notes wasn't delivered. [employer] was supposed to send her an email today with savings card information for a complementary sensor filled through her pharmacy, but she has not received that email yet.  o Extremely pleased that cytoscopy biopsy was negative for malignancy  Diabetes: CONTROLLED, though not optimally managed per CGM downloads; complicated by chronic medical conditions including PVD, CAD, peripheral neuropathy, s/p nephrectomy, most recent A1c 6.3%  Most recent eGFR: ~60 mL/min  Current antihyperglycemic regimen: Tresiba 28 units QPM, Ozempic 1 mg weekly o Resistant to metformin d/t things she has heard about lawsuits.   Current glucose readings - will not get new CGM order in until 23rd  Cardiovascular risk reduction: o Current hypertensive regimen: amlodipine 5 mg QPM, losartan 50 mg daily; last clinic BP reading at goal o Current hyperlipidemia regimen: atorvastatin 20 mg daily; last LDL at goal <70 o Current antiplatelet regimen: ASA 81 mg daily, clopidogrel 75 mg daily, severe PVD  Pharmacist Clinical Goal(s):   Over the next 90 days, patient will work with PharmD and primary care provider to address optimized medication management   Interventions:  Comprehensive medication review performed, medication list updated in electronic medical record  Inter-disciplinary care team collaboration (see longitudinal plan of care)  For  when patient receives email from [employer] with billing information for complementary sensor, sent script for sensors to local pharmacy. Patient also advised that she could purchase 1 sensor out of pocket to tide her over until next supply from [employer] arrives. She verbalized understanding  Patient Self Care Activities:   Patient will check blood glucose four times daily, document, and provide at future appointments  Patient will take medications as prescribed  Patient will contact provider with any episodes of hypoglycemia  Patient will report any questions or concerns to provider   Please see past updates related to this goal by clicking on the "Past Updates" button in the selected goal         The patient verbalized understanding of instructions provided today and declined a print copy of patient instruction materials.    Plan:  - Will f/u as previously scheduled  [name], PharmD, [place], [place] Pharmacist [place] [phone]

## 2020-03-21 NOTE — Chronic Care Management (AMB) (Signed)
Chronic Care Management   Follow Up Note   03/21/2020 Name: Laura Reed MRN: [id number] DOB: [birth year]  Referred by: [name], MD Reason for referral : Chronic Care Management (Medication Management)   Laura Reed is a 59 y.o. year old female who is a primary care patient of [name], [name], MD. The CCM team was consulted for assistance with chronic disease management and care coordination needs.    Received call from patient today with medication access questions.  Review of patient status, including review of consultants reports, relevant laboratory and other test results, and collaboration with appropriate care team members and the patient's provider was performed as part of comprehensive patient evaluation and provision of chronic care management services.    SDOH (Social Determinants of Health) assessments performed: No See Care Plan activities for detailed interventions related to [name])     Outpatient Encounter Medications as of 03/21/2020  Medication Sig Note  . acetaminophen (TYLENOL) 500 MG tablet Take 1,000 mg by mouth every 6 (six) hours as needed for mild pain or headache.  02/21/2020: Using ~2-3 times weekly  . ALPRAZolam (XANAX) 0.5 MG tablet Take 1 tablet (0.5 mg total) by mouth daily as needed. for anxiety 02/21/2020: Using ~ twice weekly for sleep  . amLODipine (NORVASC) 5 MG tablet TAKE 1 TABLET(5 MG) BY MOUTH DAILY (Patient taking differently: Take 5 mg by mouth every evening. TAKE 1 TABLET(5 MG) BY MOUTH DAILY)   . aspirin 81 MG chewable tablet Chew 81 mg by mouth at bedtime.    Marland Kitchen atorvastatin (LIPITOR) 20 MG tablet Take 1 tablet (20 mg total) by mouth daily. (Patient taking differently: Take 20 mg by mouth every evening. )   . Cholecalciferol (VITAMIN D3) 2000 UNITS TABS Take 2,000 Units by mouth daily after supper.    . clopidogrel (PLAVIX) 75 MG tablet Take 1 tablet (75 mg total) by mouth daily.   . Continuous Blood Gluc Sensor (FREESTYLE LIBRE 2  SENSOR) MISC Use to check sugar at least 4 times daily   . gabapentin (NEURONTIN) 600 MG tablet TAKE 1 TABLET(600 MG) BY MOUTH THREE TIMES DAILY (Patient taking differently: 600 mg 3 (three) times daily. )   . insulin degludec (TRESIBA FLEXTOUCH) 100 UNIT/ML FlexTouch Pen Inject 0.28 mLs (28 Units total) into the skin daily. (Patient taking differently: Inject 28 Units into the skin at bedtime. )   . Insulin Pen Needle (PEN NEEDLES) 32G X 4 MM MISC Use to take insulin daily   . Lancets Misc. MISC Use to check blood sugars 4 times daily   . losartan (COZAAR) 50 MG tablet Take 1 tablet (50 mg total) by mouth daily. (Patient taking differently: Take 50 mg by mouth every evening. )   . Semaglutide, 1 MG/DOSE, (OZEMPIC, 1 MG/DOSE,) 4 MG/3ML SOPN Inject 0.75 mLs (1 mg total) into the skin once a week. (Patient taking differently: Inject 1 mg into the skin once a week. TUESDAY)    No facility-administered encounter medications on file as of 03/21/2020.     Objective:   Goals Addressed              This Visit's Progress     Patient Stated   .  PharmD "I want to control my blood sugars better" (pt-stated)        CARE PLAN ENTRY (see longtitudinal plan of care for additional care plan information)  Current Barriers:  . Social, financial, community barriers:  o Calls today with concerns about  Libre sensors. Notes that [employer] had down that they shipped 7 sensors, but she believes she only received 6. Had a sensor fall off early, was sent a replacement by [employer] that she notes wasn't delivered. [employer] was supposed to send her an email today with savings card information for a complementary sensor filled through her pharmacy, but she has not received that email yet.  o Extremely pleased that cytoscopy biopsy was negative for malignancy . Diabetes: CONTROLLED, though not optimally managed per CGM downloads; complicated by chronic medical conditions including PVD, CAD, peripheral  neuropathy, s/p nephrectomy, most recent A1c 6.3% . Most recent eGFR: ~60 mL/min . Current antihyperglycemic regimen: Tresiba 28 units QPM, Ozempic 1 mg weekly o Resistant to metformin d/t things she has heard about lawsuits.  . Current glucose readings - will not get new CGM order in until 23rd . Cardiovascular risk reduction: o Current hypertensive regimen: amlodipine 5 mg QPM, losartan 50 mg daily; last clinic BP reading at goal o Current hyperlipidemia regimen: atorvastatin 20 mg daily; last LDL at goal <70 o Current antiplatelet regimen: ASA 81 mg daily, clopidogrel 75 mg daily, severe PVD  Pharmacist Clinical Goal(s):  Marland Kitchen Over the next 90 days, patient will work with PharmD and primary care provider to address optimized medication management   Interventions: . Comprehensive medication review performed, medication list updated in electronic medical record . Inter-disciplinary care team collaboration (see longitudinal plan of care) . For when patient receives email from [employer] with billing information for complementary sensor, sent script for sensors to local pharmacy. Patient also advised that she could purchase 1 sensor out of pocket to tide her over until next supply from [employer] arrives. She verbalized understanding  Patient Self Care Activities:  . Patient will check blood glucose four times daily, document, and provide at future appointments . Patient will take medications as prescribed . Patient will contact provider with any episodes of hypoglycemia . Patient will report any questions or concerns to provider   Please see past updates related to this goal by clicking on the "Past Updates" button in the selected goal          Plan:  - Will f/u as previously scheduled  [name], PharmD, [place], [place] Pharmacist [place] [phone]

## 2020-03-21 NOTE — Telephone Encounter (Signed)
Pt aware of results. Cysto scheduled for 6 months

## 2020-03-21 NOTE — Telephone Encounter (Signed)
-----   Message from Hollice Espy, MD sent at 03/20/2020  4:56 PM EDT ----- No cancer!!!!! Just inflammation.  Great news.  Lets have you follow up for cystoscopy in 6 months.  Hollice Espy, MD

## 2020-03-23 DIAGNOSIS — E113512 Type 2 diabetes mellitus with proliferative diabetic retinopathy with macular edema, left eye: Secondary | ICD-10-CM | POA: Diagnosis not present

## 2020-03-26 DIAGNOSIS — Z794 Long term (current) use of insulin: Secondary | ICD-10-CM | POA: Diagnosis not present

## 2020-03-26 DIAGNOSIS — E118 Type 2 diabetes mellitus with unspecified complications: Secondary | ICD-10-CM | POA: Diagnosis not present

## 2020-03-28 ENCOUNTER — Other Ambulatory Visit: Payer: Self-pay

## 2020-03-28 DIAGNOSIS — Z794 Long term (current) use of insulin: Secondary | ICD-10-CM | POA: Diagnosis not present

## 2020-03-28 DIAGNOSIS — Z89421 Acquired absence of other right toe(s): Secondary | ICD-10-CM | POA: Diagnosis not present

## 2020-03-28 DIAGNOSIS — Z872 Personal history of diseases of the skin and subcutaneous tissue: Secondary | ICD-10-CM | POA: Diagnosis not present

## 2020-03-28 DIAGNOSIS — I739 Peripheral vascular disease, unspecified: Secondary | ICD-10-CM | POA: Diagnosis not present

## 2020-03-28 DIAGNOSIS — E1140 Type 2 diabetes mellitus with diabetic neuropathy, unspecified: Secondary | ICD-10-CM | POA: Diagnosis not present

## 2020-03-30 ENCOUNTER — Encounter: Payer: Self-pay | Admitting: Internal Medicine

## 2020-03-30 ENCOUNTER — Ambulatory Visit (INDEPENDENT_AMBULATORY_CARE_PROVIDER_SITE_OTHER): Payer: Medicare Other | Admitting: Internal Medicine

## 2020-03-30 ENCOUNTER — Other Ambulatory Visit: Payer: Self-pay

## 2020-03-30 VITALS — BP 132/60 | HR 83 | Temp 98.2°F | Ht 67.01 in | Wt 158.6 lb

## 2020-03-30 DIAGNOSIS — Z20822 Contact with and (suspected) exposure to covid-19: Secondary | ICD-10-CM | POA: Diagnosis not present

## 2020-03-30 DIAGNOSIS — I959 Hypotension, unspecified: Secondary | ICD-10-CM

## 2020-03-30 DIAGNOSIS — Z23 Encounter for immunization: Secondary | ICD-10-CM

## 2020-03-30 DIAGNOSIS — U071 COVID-19: Secondary | ICD-10-CM

## 2020-03-30 LAB — POCT INFLUENZA A/B
Influenza A, POC: NEGATIVE
Influenza B, POC: NEGATIVE

## 2020-03-30 NOTE — Progress Notes (Signed)
Subjective:  Patient ID: Laura Reed, female    DOB: [birth year]  Age: 59 y.o. MRN: [id number]  CC: The primary encounter diagnosis was Need for immunization against influenza. Diagnoses of COVID-19, Acute hypotension, and Suspected COVID-19 virus infection were also pertinent to this visit.  HPI YARISA LYNAM presents for follow up on recent episode of hypotension   Patient was screened by front desk but did not answer affirmative to the questions because she thought since her symptoms had resolved that they were not relevant.   She reports a recent episode of severe hypotension accompanied by 3 days of profuse rhinorrhea and congestion that lasted 3 days.  Her symptoms resolved by the end of Monday       Patient's  BP at home was 60/30,  She held her medication for several days and resumed al  Antihypertensives at once yesterday .  BP is normal today   Outpatient Medications Prior to Visit  Medication Sig Dispense Refill  . acetaminophen (TYLENOL) 500 MG tablet Take 1,000 mg by mouth every 6 (six) hours as needed for mild pain or headache.     . ALPRAZolam (XANAX) 0.5 MG tablet Take 1 tablet (0.5 mg total) by mouth daily as needed. for anxiety 30 tablet 5  . amLODipine (NORVASC) 5 MG tablet TAKE 1 TABLET(5 MG) BY MOUTH DAILY (Patient taking differently: Take 5 mg by mouth every evening. TAKE 1 TABLET(5 MG) BY MOUTH DAILY) 90 tablet 1  . aspirin 81 MG chewable tablet Chew 81 mg by mouth at bedtime.     Marland Kitchen atorvastatin (LIPITOR) 20 MG tablet Take 1 tablet (20 mg total) by mouth daily. (Patient taking differently: Take 20 mg by mouth every evening. ) 90 tablet 1  . Cholecalciferol (VITAMIN D3) 2000 UNITS TABS Take 2,000 Units by mouth daily after supper.     . clopidogrel (PLAVIX) 75 MG tablet Take 1 tablet (75 mg total) by mouth daily. 90 tablet 1  . Continuous Blood Gluc Sensor (FREESTYLE LIBRE 2 SENSOR) MISC Use to check sugar at least 4 times daily 2 each 2  . gabapentin (NEURONTIN)  600 MG tablet TAKE 1 TABLET(600 MG) BY MOUTH THREE TIMES DAILY (Patient taking differently: 600 mg 3 (three) times daily. ) 270 tablet 1  . insulin degludec (TRESIBA FLEXTOUCH) 100 UNIT/ML FlexTouch Pen Inject 0.28 mLs (28 Units total) into the skin daily. (Patient taking differently: Inject 28 Units into the skin at bedtime. ) 27 mL 3  . Insulin Pen Needle (PEN NEEDLES) 32G X 4 MM MISC Use to take insulin daily 100 each 3  . Lancets Misc. MISC Use to check blood sugars 4 times daily 400 each 3  . losartan (COZAAR) 50 MG tablet Take 1 tablet (50 mg total) by mouth daily. (Patient taking differently: Take 50 mg by mouth every evening. ) 90 tablet 1  . Semaglutide, 1 MG/DOSE, (OZEMPIC, 1 MG/DOSE,) 4 MG/3ML SOPN Inject 0.75 mLs (1 mg total) into the skin once a week. (Patient taking differently: Inject 1 mg into the skin once a week. TUESDAY) 3 mL 2   No facility-administered medications prior to visit.    Review of Systems;  Patient denies , fevers, , unintentional weight loss, skin rash, eye pain, sinus congestion and sinus pain, sore throat, dysphagia,  hemoptysis , cough, dyspnea, wheezing, chest pain, palpitations, orthopnea, edema, abdominal pain, nausea, melena, diarrhea, constipation, flank pain, dysuria, hematuria, urinary  Frequency, nocturia, numbness, tingling, seizures,  Focal weakness, Loss of consciousness,  Tremor, insomnia, depression, anxiety, and suicidal ideation.      Objective:  BP 132/60 (BP Location: Left Arm, Patient Position: Sitting)   Pulse 83   Temp 98.2 F (36.8 C)   Ht 5' 7.01" (1.702 m)   Wt 158 lb 9.6 oz (71.9 kg)   SpO2 96%   BMI 24.83 kg/m   BP Readings from Last 3 Encounters:  03/30/20 132/60  03/19/20 (!) 98/52  02/28/20 (!) 124/56    Wt Readings from Last 3 Encounters:  03/30/20 158 lb 9.6 oz (71.9 kg)  03/19/20 160 lb (72.6 kg)  02/28/20 164 lb (74.4 kg)    General appearance: alert, cooperative and appears stated age Ears: normal TM's and  external ear canals both ears Throat: lips, mucosa, and tongue normal; teeth and gums normal Neck: no adenopathy, no carotid bruit, supple, symmetrical, trachea midline and thyroid not enlarged, symmetric, no tenderness/mass/nodules Back: symmetric, no curvature. ROM normal. No CVA tenderness. Lungs: clear to auscultation bilaterally Heart: regular rate and rhythm, S1, S2 normal, no murmur, click, rub or gallop Abdomen: soft, non-tender; bowel sounds normal; no masses,  no organomegaly Pulses: 2+ and symmetric Skin: Skin color, texture, turgor normal. No rashes or lesions Lymph nodes: Cervical, supraclavicular, and axillary nodes normal.  Lab Results  Component Value Date   HGBA1C 6.3 12/28/2019   HGBA1C 8.8 (H) 09/26/2019   HGBA1C 7.7 (H) 05/26/2019    Lab Results  Component Value Date   CREATININE 0.91 12/28/2019   CREATININE 0.87 09/26/2019   CREATININE 0.93 05/26/2019    Lab Results  Component Value Date   WBC 9.2 03/15/2020   HGB 13.6 03/15/2020   HCT 37.4 03/15/2020   PLT 274 03/15/2020   GLUCOSE 96 12/28/2019   CHOL 117 12/28/2019   TRIG 68.0 12/28/2019   HDL 52.90 12/28/2019   LDLDIRECT 47.0 01/28/2017   LDLCALC 51 12/28/2019   ALT 20 12/28/2019   AST 21 12/28/2019   NA 132 (L) 03/19/2020   K 4.5 12/28/2019   CL 97 12/28/2019   CREATININE 0.91 12/28/2019   BUN 20 12/28/2019   CO2 28 12/28/2019   TSH 0.96 05/18/2015   INR 0.9 10/17/2014   HGBA1C 6.3 12/28/2019   MICROALBUR 18.0 (H) 05/26/2019    No results found.  Assessment & Plan:   Problem List Items Addressed This Visit      Unprioritized   Acute hypotension    Now resolved,  Occurred 4 days after her cystoscopy  So unlikely the cause.  Acute illness suspicious for COVID occurred and resolved      Suspected COVID-19 virus infection    Patient's follow up visit was converted to testing for COVID 19 Infection due to recent symptoms of hypotension , rhinorrhea and malaise which ended 72 hours  ago.        Other Visit Diagnoses    Need for immunization against influenza    -  Primary   Relevant Orders   Flu Vaccine QUAD 36+ mos IM (Completed)   COVID-19       Relevant Orders   POCT Influenza A/B (Completed)   Novel Coronavirus, NAA ([facility]) (Completed)     I provided  30 minutes of  face-to-face time during this encounter reviewing patient's current problems and past surgeries, labs and imaging studies, providing counseling on the above mentioned problems , and coordination  of care .   I am having Burnis Medin. Habel maintain her Vitamin D3, aspirin, acetaminophen, Lancets Misc., ALPRAZolam, atorvastatin, amLODipine,  clopidogrel, losartan, Ozempic (1 MG/DOSE), gabapentin, Tresiba FlexTouch, Pen Needles, and [employer] 2 Sensor.  No orders of the defined types were placed in this encounter.   There are no discontinued medications.  Follow-up: No follow-ups on file.   [name], MD

## 2020-04-01 DIAGNOSIS — I959 Hypotension, unspecified: Secondary | ICD-10-CM | POA: Insufficient documentation

## 2020-04-01 LAB — SARS-COV-2, NAA 2 DAY TAT

## 2020-04-01 LAB — NOVEL CORONAVIRUS, NAA: SARS-CoV-2, NAA: NOT DETECTED

## 2020-04-01 NOTE — Assessment & Plan Note (Signed)
Now resolved,  Occurred 4 days after her cystoscopy  So unlikely the cause.  Acute illness suspicious for COVID occurred and resolved

## 2020-04-01 NOTE — Progress Notes (Signed)
  I'm happy to tell you that Your covid 19 test was negative.  You should continue to protect yourself from future infection with social distancing and masking   Regards,   Deborra Medina, MD

## 2020-04-01 NOTE — Assessment & Plan Note (Signed)
Patient's follow up visit was converted to testing for COVID 19 Infection due to recent symptoms of hypotension , rhinorrhea and malaise which ended 72 hours ago.

## 2020-04-19 ENCOUNTER — Encounter (INDEPENDENT_AMBULATORY_CARE_PROVIDER_SITE_OTHER): Payer: Medicare Other

## 2020-04-19 ENCOUNTER — Ambulatory Visit (INDEPENDENT_AMBULATORY_CARE_PROVIDER_SITE_OTHER): Payer: Medicare Other | Admitting: Vascular Surgery

## 2020-04-25 DIAGNOSIS — E113512 Type 2 diabetes mellitus with proliferative diabetic retinopathy with macular edema, left eye: Secondary | ICD-10-CM | POA: Diagnosis not present

## 2020-04-26 ENCOUNTER — Other Ambulatory Visit: Payer: Self-pay

## 2020-04-26 DIAGNOSIS — E118 Type 2 diabetes mellitus with unspecified complications: Secondary | ICD-10-CM | POA: Diagnosis not present

## 2020-04-26 DIAGNOSIS — Z794 Long term (current) use of insulin: Secondary | ICD-10-CM | POA: Diagnosis not present

## 2020-04-30 ENCOUNTER — Ambulatory Visit: Payer: Medicare Other

## 2020-05-03 ENCOUNTER — Other Ambulatory Visit: Payer: Self-pay

## 2020-05-07 ENCOUNTER — Ambulatory Visit: Payer: Medicare Other

## 2020-05-07 ENCOUNTER — Telehealth: Payer: Self-pay | Admitting: Pharmacist

## 2020-05-07 NOTE — Progress Notes (Signed)
Patient called to reschedule my appointment today (now coming Wednesday), but also would like to schedule an appointment w/ Dr. Derrel Nip to discuss "things that we didn't have time to talk about" at previous appt d/t symptoms. Would also like to discuss obtaining a handicap tag. Also needs lab work. Routing to PCP and CMA

## 2020-05-09 ENCOUNTER — Other Ambulatory Visit: Payer: Self-pay

## 2020-05-09 ENCOUNTER — Telehealth: Payer: Self-pay

## 2020-05-09 ENCOUNTER — Ambulatory Visit: Payer: Medicare Other | Admitting: Pharmacist

## 2020-05-09 DIAGNOSIS — E1151 Type 2 diabetes mellitus with diabetic peripheral angiopathy without gangrene: Secondary | ICD-10-CM

## 2020-05-09 DIAGNOSIS — E1169 Type 2 diabetes mellitus with other specified complication: Secondary | ICD-10-CM

## 2020-05-09 DIAGNOSIS — E785 Hyperlipidemia, unspecified: Secondary | ICD-10-CM

## 2020-05-09 DIAGNOSIS — I10 Essential (primary) hypertension: Secondary | ICD-10-CM

## 2020-05-09 NOTE — Patient Instructions (Signed)
Visit Information  Goals Addressed              This Visit's Progress     Patient Stated   .  PharmD "I want to control my blood sugars better" (pt-stated)        CARE PLAN ENTRY (see longtitudinal plan of care for additional care plan information)  Current Barriers:  . Social, financial, community barriers:  o Reports having to put her cat down recently . Diabetes: CONTROLLED, though not optimally managed per CGM downloads; complicated by chronic medical conditions including PVD, CAD, peripheral neuropathy, s/p nephrectomy, most recent A1c 6.3% . Most recent eGFR: ~60 mL/min . Current antihyperglycemic regimen: Tresiba 26 units QPM, Ozempic 1 mg weekly o Resistant to metformin d/t things she has heard about lawsuits.  . Current glucose readings: Date of Download: 10/21-11/3/21 % Time CGM is active: 96% Average Glucose: 159 mg/dL Glucose Management Indicator: 7.1% Glucose Variability: 29.8 (goal <36%) Time in Goal:  - Time in range 70-180: 67% - Time above range: 32% - Time below range: 1% Observed patterns:  . Cardiovascular risk reduction: elevations after bedtime snack  o Current hypertensive regimen: amlodipine 5 mg QPM, losartan 50 mg daily; last clinic BP reading at goal o Current hyperlipidemia regimen: atorvastatin 20 mg daily; last LDL at goal <70 o Current antiplatelet regimen: ASA 81 mg daily, clopidogrel 75 mg daily, severe PVD  Pharmacist Clinical Goal(s):  Marland Kitchen Over the next 90 days, patient will work with PharmD and primary care provider to address optimized medication management   Interventions: . Comprehensive medication review performed, medication list updated in electronic medical record . Inter-disciplinary care team collaboration (see longitudinal plan of care) . Patient notes that she is probably over snacking at bedtime due to worries about hypoglycemia. She will minimize snacking, but incorporate protein when she does snack . Continue current  regimen. A1c today. Discussed that even though A1c may be slightly higher than last, she is having less hypoglycemia, which is safer. . Could consider use of SGLT2 moving forward for CV, CKD, and insulin minimization benefits.    Patient Self Care Activities:  . Patient will check blood glucose four times daily, document, and provide at future appointments . Patient will take medications as prescribed . Patient will contact provider with any episodes of hypoglycemia . Patient will report any questions or concerns to provider   Please see past updates related to this goal by clicking on the "Past Updates" button in the selected goal         The patient verbalized understanding of instructions provided today and declined a print copy of patient instruction materials.   Plan:  - Scheduled f/u in ~ 6 weeks  [name], PharmD, [place], [place] Pharmacist [place] [phone]

## 2020-05-09 NOTE — Chronic Care Management (AMB) (Signed)
Chronic Care Management   Follow Up Note   05/09/2020 Name: Laura Reed MRN: [id number] DOB: 06/14/1961  Referred by: [name], MD Reason for referral : Chronic Care Management (Medication Management)   Laura Reed is a 59 y.o. year old female who is a primary care patient of [name], [name], MD. The CCM team was consulted for assistance with chronic disease management and care coordination needs.    Met with patient for face to face follow up visit.  Review of patient status, including review of consultants reports, relevant laboratory and other test results, and collaboration with appropriate care team members and the patient's provider was performed as part of comprehensive patient evaluation and provision of chronic care management services.    SDOH (Social Determinants of Health) assessments performed: No See Care Plan activities for detailed interventions related to [facility])     Outpatient Encounter Medications as of 05/09/2020  Medication Sig Note  . acetaminophen (TYLENOL) 500 MG tablet Take 1,000 mg by mouth every 6 (six) hours as needed for mild pain or headache.  02/21/2020: Using ~2-3 times weekly  . ALPRAZolam (XANAX) 0.5 MG tablet Take 1 tablet (0.5 mg total) by mouth daily as needed. for anxiety 02/21/2020: Using ~ twice weekly for sleep  . amLODipine (NORVASC) 5 MG tablet TAKE 1 TABLET(5 MG) BY MOUTH DAILY   . aspirin 81 MG chewable tablet Chew 81 mg by mouth at bedtime.    Marland Kitchen atorvastatin (LIPITOR) 20 MG tablet Take 1 tablet (20 mg total) by mouth daily.   . Cholecalciferol (VITAMIN D3) 2000 UNITS TABS Take 2,000 Units by mouth daily after supper.    . clopidogrel (PLAVIX) 75 MG tablet Take 1 tablet (75 mg total) by mouth daily.   . Continuous Blood Gluc Sensor (FREESTYLE LIBRE 2 SENSOR) MISC Use to check sugar at least 4 times daily   . gabapentin (NEURONTIN) 600 MG tablet TAKE 1 TABLET(600 MG) BY MOUTH THREE TIMES DAILY (Patient taking differently: 600 mg 3  (three) times daily. )   . insulin degludec (TRESIBA FLEXTOUCH) 100 UNIT/ML FlexTouch Pen Inject 0.28 mLs (28 Units total) into the skin daily. (Patient taking differently: Inject 28 Units into the skin at bedtime. )   . Insulin Pen Needle (PEN NEEDLES) 32G X 4 MM MISC Use to take insulin daily   . Lancets Misc. MISC Use to check blood sugars 4 times daily   . losartan (COZAAR) 50 MG tablet Take 1 tablet (50 mg total) by mouth daily. (Patient taking differently: Take 50 mg by mouth every evening. )   . Semaglutide, 1 MG/DOSE, (OZEMPIC, 1 MG/DOSE,) 4 MG/3ML SOPN Inject 0.75 mLs (1 mg total) into the skin once a week. (Patient taking differently: Inject 1 mg into the skin once a week. TUESDAY)    No facility-administered encounter medications on file as of 05/09/2020.     Objective:    Goals Addressed              This Visit's Progress     Patient Stated   .  PharmD "I want to control my blood sugars better" (pt-stated)        CARE PLAN ENTRY (see longtitudinal plan of care for additional care plan information)  Current Barriers:  . Social, financial, community barriers:  o Reports having to put her cat down recently . Diabetes: CONTROLLED, though not optimally managed per CGM downloads; complicated by chronic medical conditions including PVD, CAD, peripheral neuropathy, s/p nephrectomy, most  recent A1c 6.3% . Most recent eGFR: ~60 mL/min . Current antihyperglycemic regimen: Tresiba 26 units QPM, Ozempic 1 mg weekly o Resistant to metformin d/t things she has heard about lawsuits.  . Current glucose readings: Date of Download: 10/21-11/3/21 % Time CGM is active: 96% Average Glucose: 159 mg/dL Glucose Management Indicator: 7.1% Glucose Variability: 29.8 (goal <36%) Time in Goal:  - Time in range 70-180: 67% - Time above range: 32% - Time below range: 1% Observed patterns:  . Cardiovascular risk reduction: elevations after bedtime snack  o Current hypertensive regimen:  amlodipine 5 mg QPM, losartan 50 mg daily; last clinic BP reading at goal o Current hyperlipidemia regimen: atorvastatin 20 mg daily; last LDL at goal <70 o Current antiplatelet regimen: ASA 81 mg daily, clopidogrel 75 mg daily, severe PVD  Pharmacist Clinical Goal(s):  Marland Kitchen Over the next 90 days, patient will work with PharmD and primary care provider to address optimized medication management   Interventions: . Comprehensive medication review performed, medication list updated in electronic medical record . Inter-disciplinary care team collaboration (see longitudinal plan of care) . Patient notes that she is probably over snacking at bedtime due to worries about hypoglycemia. She will minimize snacking, but incorporate protein when she does snack . Continue current regimen. A1c today. Discussed that even though A1c may be slightly higher than last, she is having less hypoglycemia, which is safer. . Could consider use of SGLT2 moving forward for CV, CKD, and insulin minimization benefits.    Patient Self Care Activities:  . Patient will check blood glucose four times daily, document, and provide at future appointments . Patient will take medications as prescribed . Patient will contact provider with any episodes of hypoglycemia . Patient will report any questions or concerns to provider   Please see past updates related to this goal by clicking on the "Past Updates" button in the selected goal          Plan:  - Scheduled f/u in ~ 6 weeks  [name], PharmD, [place], [place] Pharmacist [place] [phone]

## 2020-05-09 NOTE — Telephone Encounter (Signed)
Labs for appt with Catie today have been ordered.

## 2020-05-10 LAB — LIPID PANEL
Cholesterol: 105 mg/dL (ref 0–200)
HDL: 50.2 mg/dL (ref >39.00)
LDL Cholesterol: 43 mg/dL (ref 0–99)
NonHDL: 54.68
Total CHOL/HDL Ratio: 2
Triglycerides: 59 mg/dL (ref 0.0–149.0)
VLDL: 11.8 mg/dL (ref 0.0–40.0)

## 2020-05-10 LAB — COMPREHENSIVE METABOLIC PANEL
ALT: 16 U/L (ref 0–35)
AST: 14 U/L (ref 0–37)
Albumin: 4 g/dL (ref 3.5–5.2)
Alkaline Phosphatase: 102 U/L (ref 39–117)
BUN: 16 mg/dL (ref 6–23)
CO2: 28 mEq/L (ref 19–32)
Calcium: 9.1 mg/dL (ref 8.4–10.5)
Chloride: 99 mEq/L (ref 96–112)
Creatinine, Ser: 0.89 mg/dL (ref 0.40–1.20)
GFR: 69.86 mL/min (ref >60.00)
Glucose, Bld: 103 mg/dL — ABNORMAL HIGH (ref 70–99)
Potassium: 4.7 mEq/L (ref 3.5–5.1)
Sodium: 135 mEq/L (ref 135–145)
Total Bilirubin: 0.5 mg/dL (ref 0.2–1.2)
Total Protein: 6.6 g/dL (ref 6.0–8.3)

## 2020-05-10 LAB — HEMOGLOBIN A1C: Hgb A1c MFr Bld: 8.5 % — ABNORMAL HIGH (ref 4.6–6.5)

## 2020-05-13 NOTE — Progress Notes (Signed)
Your A1c is quite high, but the rest of your labs are normal.  Please send me readings of your blood sugars.  Regards,   Deborra Medina, MD

## 2020-05-14 ENCOUNTER — Ambulatory Visit (INDEPENDENT_AMBULATORY_CARE_PROVIDER_SITE_OTHER): Payer: Medicare Other

## 2020-05-14 ENCOUNTER — Encounter (INDEPENDENT_AMBULATORY_CARE_PROVIDER_SITE_OTHER): Payer: Self-pay | Admitting: Nurse Practitioner

## 2020-05-14 ENCOUNTER — Ambulatory Visit (INDEPENDENT_AMBULATORY_CARE_PROVIDER_SITE_OTHER): Payer: Medicare Other | Admitting: Nurse Practitioner

## 2020-05-14 ENCOUNTER — Other Ambulatory Visit: Payer: Self-pay

## 2020-05-14 VITALS — BP 84/53 | HR 77 | Ht 65.0 in | Wt 159.0 lb

## 2020-05-14 DIAGNOSIS — I70213 Atherosclerosis of native arteries of extremities with intermittent claudication, bilateral legs: Secondary | ICD-10-CM

## 2020-05-14 DIAGNOSIS — E785 Hyperlipidemia, unspecified: Secondary | ICD-10-CM

## 2020-05-14 DIAGNOSIS — Z87891 Personal history of nicotine dependence: Secondary | ICD-10-CM

## 2020-05-14 DIAGNOSIS — E1169 Type 2 diabetes mellitus with other specified complication: Secondary | ICD-10-CM | POA: Diagnosis not present

## 2020-05-14 DIAGNOSIS — I6523 Occlusion and stenosis of bilateral carotid arteries: Secondary | ICD-10-CM

## 2020-05-20 ENCOUNTER — Encounter (INDEPENDENT_AMBULATORY_CARE_PROVIDER_SITE_OTHER): Payer: Self-pay | Admitting: Nurse Practitioner

## 2020-05-20 NOTE — Progress Notes (Signed)
Subjective:    Patient ID: Laura Reed, female    DOB: [birth year], 59 y.o.   MRN: [id number] Chief Complaint  Patient presents with  . Follow-up    U/S follow up    The patient is seen for follow up evaluation of carotid stenosis. The carotid stenosis followed by ultrasound.   The patient denies amaurosis fugax. There is no recent history of TIA symptoms or focal motor deficits. There is no prior documented CVA.  The patient is taking enteric-coated aspirin 81 mg daily.  There is no history of migraine headaches. There is no history of seizures.  The patient has a history of coronary artery disease, no recent episodes of angina or shortness of breath.  There is a history of hyperlipidemia which is being treated with a statin.    Duplex ultrasound shows 60 to 79% stenosis of the left ICA with 1 to 39% stenosis of the right ICA.  The patient returns to the office for followup and review of the noninvasive studies. There have been no interval changes in lower extremity symptoms. No interval shortening of the patient's claudication distance or development of rest pain symptoms. No new ulcers or wounds have occurred since the last visit.  There have been no significant changes to the patient's overall health care.   ABI Rt=0.84 and Lt=0.79  (previous ABI's Rt=0.82 and Lt=0.89) Duplex ultrasound of the biphasic tibial artery waveforms bilaterally.   Review of Systems  Eyes: Negative for visual disturbance.  Neurological: Negative for facial asymmetry and numbness.  All other systems reviewed and are negative.      Objective:   Physical Exam Vitals reviewed.  Neck:     Vascular: No carotid bruit.  Cardiovascular:     Rate and Rhythm: Normal rate.     Pulses: Decreased pulses.  Pulmonary:     Effort: Pulmonary effort is normal.  Neurological:     Mental Status: She is alert and oriented to person, place, and time.  Psychiatric:        Mood and Affect: Mood normal.         Behavior: Behavior normal.        Thought Content: Thought content normal.        Judgment: Judgment normal.     BP (!) 84/53   Pulse 77   Ht 5\' 5"  (1.651 m)   Wt 159 lb (72.1 kg)   BMI 26.46 kg/m   Past Medical History:  Diagnosis Date  . Absence of kidney    left  . Anxiety   . Arthritis   . Bladder cancer ([place])   . CHF (congestive heart failure) ([place])   . Complication of anesthesia    BP HAS  RUN LOW AFTER SURGERY-LUNGS FILLED UP WITH FLUID AFTER  LEG STENT SURGERY   . Coronary artery disease   . Diabetes mellitus   . Family history of adverse reaction to anesthesia    Sister - PONV  . GERD (gastroesophageal reflux disease)    OCC TUMS  . Heart murmur   . Hemorrhoid   . History of methicillin resistant staphylococcus aureus (MRSA) 2007  . Hypertension   . Neuropathy   . PVD (peripheral vascular disease) ([place])   . Thyroid nodule    right  . Urothelial carcinoma of kidney ([place]) 10/31/2014   INVASIVE UROTHELIAL CARCINOMA, LOW GRADE. T1, Nx.  . Wears dentures    full upper and lower    Social History   Socioeconomic History  .  Marital status: Single    Spouse name: Not on file  . Number of children: Not on file  . Years of education: Not on file  . Highest education level: Not on file  Occupational History  . Not on file  Tobacco Use  . Smoking status: Former Smoker    Packs/day: 2.00    Years: 35.00    Pack years: 70.00    Types: Cigarettes    Quit date: 03/29/2013    Years since quitting: 7.1  . Smokeless tobacco: Never Used  Vaping Use  . Vaping Use: Former  Substance and Sexual Activity  . Alcohol use: Not Currently    Alcohol/week: 0.0 standard drinks    Comment: LAST DRINK 2009  . Drug use: Not Currently    Types: Cocaine    Comment: last used in 2007  . Sexual activity: Not Currently  Other Topics Concern  . Not on file  Social History Narrative  . Not on file   Social Determinants of Health   Financial Resource Strain: Low Risk    . Difficulty of Paying Living Expenses: Not hard at all  Food Insecurity:   . Worried About Charity fundraiser in the Last Year: Not on file  . Ran Out of Food in the Last Year: Not on file  Transportation Needs:   . Lack of Transportation (Medical): Not on file  . Lack of Transportation (Non-Medical): Not on file  Physical Activity:   . Days of Exercise per Week: Not on file  . Minutes of Exercise per Session: Not on file  Stress:   . Feeling of Stress : Not on file  Social Connections: Unknown  . Frequency of Communication with Friends and Family: More than three times a week  . Frequency of Social Gatherings with Friends and Family: More than three times a week  . Attends Religious Services: Not on file  . Active Member of Clubs or Organizations: Not on file  . Attends Archivist Meetings: Not on file  . Marital Status: Not on file  Intimate Partner Violence:   . Fear of Current or Ex-Partner: Not on file  . Emotionally Abused: Not on file  . Physically Abused: Not on file  . Sexually Abused: Not on file    Past Surgical History:  Procedure Laterality Date  . AMPUTATION TOE     right (4th and 5th); left (great toe, 3rd)  . AMPUTATION TOE Right 07/16/2018   Procedure: AMPUTATION TOE/MPJ right 2nd;  Surgeon: Sharlotte Alamo, DPM;  Location: [facility];  Service: Podiatry;  Laterality: Right;  . ARTERIAL BYPASS SURGRY  2009, 2013 x 2   right leg , done in [place]  . CARDIAC CATHETERIZATION    . CAROTID ENDARTERECTOMY Right 01/2014   Dr Delana Meyer  . CATARACT EXTRACTION W/PHACO Right 12/14/2014   Procedure: CATARACT EXTRACTION PHACO AND INTRAOCULAR LENS PLACEMENT (IOC);  Surgeon: Lyla Glassing, MD;  Location: [facility];  Service: Ophthalmology;  Laterality: Right;  [place]   00:38.6              AP        7.1                   CDE  2.76  . CATARACT EXTRACTION W/PHACO Left 12/06/2019   Procedure: CATARACT EXTRACTION PHACO AND INTRAOCULAR LENS PLACEMENT ([place]) LEFT DIABETIC;  Surgeon:  Birder Robson, MD;  Location: [place];  Service: Ophthalmology;  Laterality: Left;  9.08 1:06.4  .  CESAREAN SECTION    . CHOLECYSTECTOMY  03-03-12   Porcelain gallbladder, gallstones,  [place]  . COLONOSCOPY W/ BIOPSIES  04/28/2012   Hyperplastic rectal polyps.  . CORONARY ARTERY BYPASS GRAFT  2009   3 vessel  . CYSTOSCOPY W/ RETROGRADES Right 09/01/2016   Procedure: CYSTOSCOPY WITH RETROGRADE PYELOGRAM;  Surgeon: Hollice Espy, MD;  Location: [facility];  Service: Urology;  Laterality: Right;  . CYSTOSCOPY W/ RETROGRADES Bilateral 03/19/2020   Procedure: CYSTOSCOPY WITH RETROGRADE PYELOGRAM;  Surgeon: Hollice Espy, MD;  Location: [facility];  Service: Urology;  Laterality: Bilateral;  . CYSTOSCOPY WITH BIOPSY N/A 03/19/2020   Procedure: CYSTOSCOPY WITH BIOPSY;  Surgeon: Hollice Espy, MD;  Location: [facility];  Service: Urology;  Laterality: N/A;  . EYE SURGERY    . HERNIA REPAIR  10-31-14   ventral, retro-rectus atrium mesh  . IRRIGATION AND DEBRIDEMENT FOOT Left 01/18/2019   Procedure: IRRIGATION AND DEBRIDEMENT FOOT;  Surgeon: Sharlotte Alamo, DPM;  Location: [facility];  Service: Podiatry;  Laterality: Left;  . LOWER EXTREMITY ANGIOGRAPHY Left 12/10/2016   Procedure: Lower Extremity Angiography;  Surgeon: Katha Cabal, MD;  Location: [place] [facility];  Service: Cardiovascular;  Laterality: Left;  . LOWER EXTREMITY ANGIOGRAPHY Left 02/02/2018   Procedure: LOWER EXTREMITY ANGIOGRAPHY;  Surgeon: Katha Cabal, MD;  Location: [place] [facility];  Service: Cardiovascular;  Laterality: Left;  . LOWER EXTREMITY ANGIOGRAPHY Left 05/05/2018   Procedure: LOWER EXTREMITY ANGIOGRAPHY;  Surgeon: Katha Cabal, MD;  Location: [place] [facility];  Service: Cardiovascular;  Laterality: Left;  . NEPHRECTOMY Left 10-31-14  . PERIPHERAL VASCULAR CATHETERIZATION Left 05/01/2015   Procedure: Lower Extremity Angiography;  Surgeon: Katha Cabal, MD;  Location: [place] [facility];  Service: Cardiovascular;  Laterality: Left;  . PERIPHERAL VASCULAR CATHETERIZATION  05/01/2015   Procedure: Lower Extremity Intervention;  Surgeon: Katha Cabal, MD;  Location: [place] [facility];  Service: Cardiovascular;;  . PERIPHERAL VASCULAR CATHETERIZATION Left 02/20/2015   Procedure: Pelvic Angiography;  Surgeon: Katha Cabal, MD;  Location: [place] [facility];  Service: Cardiovascular;  Laterality: Left;  . TRANSURETHRAL RESECTION OF BLADDER TUMOR WITH MITOMYCIN-C N/A 09/01/2016   Procedure: TRANSURETHRAL RESECTION OF BLADDER TUMOR WITH MITOMYCIN-C;  Surgeon: Hollice Espy, MD;  Location: [facility];  Service: Urology;  Laterality: N/A;    Family History  Problem Relation Age of Onset  . Cancer Mother 90       Lung Cancer  . Cancer Father 29       Lung Ca  . Diabetes Son   . Breast cancer Maternal Grandmother   . Kidney cancer Neg Hx   . Bladder Cancer Neg Hx   . Prostate cancer Neg Hx     No Known Allergies  CBC Latest Ref Rng & Units 03/15/2020 01/19/2019 01/18/2019  WBC 4.0 - 10.5 K/uL 9.2 8.9 11.4(H)  Hemoglobin 12.0 - 15.0 g/dL 13.6 12.2 12.4  Hematocrit 36 - 46 % 37.4 37.1 36.8  Platelets 150 - 400 K/uL 274 289 271      CMP     Component Value Date/Time   NA 135 05/09/2020 1529   NA 135 11/02/2014 0609   K 4.7 05/09/2020 1529   K 4.2 11/02/2014 0609   CL 99 05/09/2020 1529   CL 107 11/02/2014 0609   CO2 28 05/09/2020 1529   CO2 23 11/02/2014 0609   GLUCOSE 103 (H) 05/09/2020 1529   GLUCOSE 108 (H) 11/02/2014 0609   BUN 16 05/09/2020  1529   BUN 20 11/02/2014 0609   CREATININE 0.89 05/09/2020 1529   CREATININE 1.01 11/09/2015 1549   CREATININE 1.01 11/09/2015 1549   CALCIUM 9.1 05/09/2020 1529   CALCIUM 7.3 (L) 11/02/2014 0609   PROT 6.6 05/09/2020 1529   PROT 5.1 (L) 06/03/2013 0353   ALBUMIN 4.0 05/09/2020 1529   ALBUMIN 2.2 (L) 06/03/2013 0353   AST 14 05/09/2020 1529   AST 7 (L) 06/03/2013 0353   ALT 16 05/09/2020 1529   ALT 10  (L) 06/03/2013 0353   ALKPHOS 102 05/09/2020 1529   ALKPHOS 74 06/03/2013 0353   BILITOT 0.5 05/09/2020 1529   BILITOT 0.2 06/03/2013 0353   GFRNONAA >60 01/19/2019 0357   GFRNONAA 50 (L) 11/02/2014 0609   GFRAA >60 01/19/2019 0357   GFRAA 58 (L) 11/02/2014 0609     VAS [place] ABI WITH/WO TBI  Result Date: 05/17/2020 LOWER EXTREMITY DOPPLER STUDY Indications: Peripheral artery disease, and ASO S/P Intervention. Other Factors: H/O right 4th & 5th and left 1st & 3rd toe amputations.  Vascular Interventions: H/O left-to-right fem-fem BPG with lower extremity                         stents & BPGs in [place];                         10/11/09: Left external iliac/common femoral artery PTA;                         07/25/11: Right profunda femoral to TP trunk BPG;                         12/31/11: Revision of fem-fem BPG & redo of anastomosis;                         09/16/12: Fem-fem BPG, left external iliac, left common                         femoral & left profunda femoral artery PTAs;                         06/01/13: Left external iliac to profunda femoral BPG                         with revision of the fem-fem & left fem-AK popliteal BPG                         proximal anastomoses;                         08/02/13: Left distal SFA/popliteal artery PTA;                         02/20/15: Left external iliac artery PTA/stent with left                         common iliac artery PTA;                         12/10/16: Left external iliac & common femoral artery  PTAs. Comparison Study: 10/17/2019 Performing Technologist: Almira Coaster RVS  Examination Guidelines: A complete evaluation includes at minimum, Doppler waveform signals and systolic blood pressure reading at the level of bilateral brachial, anterior tibial, and posterior tibial arteries, when vessel segments are accessible. Bilateral testing is considered an integral part of a complete examination. Photoelectric Plethysmograph (PPG)  waveforms and toe systolic pressure readings are included as required and additional duplex testing as needed. Limited examinations for reoccurring indications may be performed as noted.  ABI Findings: +---------+------------------+-----+--------+--------+ Right    Rt Pressure (mmHg)IndexWaveformComment  +---------+------------------+-----+--------+--------+ Brachial 119                                     +---------+------------------+-----+--------+--------+ ATA      114               0.84 biphasic         +---------+------------------+-----+--------+--------+ PTA      102               0.76 biphasic         +---------+------------------+-----+--------+--------+ Great Toe72                0.53 Abnormal         +---------+------------------+-----+--------+--------+ +---------+------------------+-----+--------+---------+ Left     Lt Pressure (mmHg)IndexWaveformComment   +---------+------------------+-----+--------+---------+ Brachial 135                                      +---------+------------------+-----+--------+---------+ ATA      103               0.76 biphasic          +---------+------------------+-----+--------+---------+ PTA      106               0.79 biphasic          +---------+------------------+-----+--------+---------+ Great Toe                               Amputated +---------+------------------+-----+--------+---------+ +-------+-----------+-----------+------------+------------+ ABI/TBIToday's ABIToday's TBIPrevious ABIPrevious TBI +-------+-----------+-----------+------------+------------+ Right  .84        .53        .82         .68          +-------+-----------+-----------+------------+------------+ Left   .79                   .89         Amputated    +-------+-----------+-----------+------------+------------+  Summary: Right: Resting right ankle-brachial index indicates mild right lower extremity arterial disease.  The right toe-brachial index is abnormal. Left: Resting left ankle-brachial index indicates moderate left lower extremity arterial disease.  *See table(s) above for measurements and observations.  Electronically signed by Hortencia Pilar MD on 05/17/2020 at 3:43:08 PM.    Final        Assessment & Plan:   1. Bilateral carotid artery stenosis Recommend:  Given the patient's asymptomatic subcritical stenosis no further invasive testing or surgery at this time.  Duplex ultrasound shows 60 to 79% stenosis of the left ICA with 1 to 39% stenosis of the right ICA.  Continue antiplatelet therapy as prescribed Continue management of CAD, HTN and Hyperlipidemia Healthy heart diet,  encouraged exercise at least 4 times per week Follow up  in 6 months with duplex ultrasound and physical exam   2. Atherosclerosis of native artery of both lower extremities with intermittent claudication (HCC)  Recommend:  The patient has evidence of atherosclerosis of the lower extremities with claudication.  The patient does not voice lifestyle limiting changes at this point in time.  Noninvasive studies do not suggest clinically significant change.  No invasive studies, angiography or surgery at this time The patient should continue walking and begin a more formal exercise program.  The patient should continue antiplatelet therapy and aggressive treatment of the lipid abnormalities  No changes in the patient's medications at this time  The patient should continue wearing graduated compression socks 10-15 mmHg strength to control the mild edema.   Patient will return in 6 months with noninvasive studies  3. Hyperlipidemia associated with type 2 diabetes mellitus ([place]) Continue statin as ordered and reviewed, no changes at this time   4. History of tobacco abuse Patient continues to abstain from tobacco use, abstinence is encouraged.   Current Outpatient Medications on File Prior to Visit  Medication Sig  Dispense Refill  . acetaminophen (TYLENOL) 500 MG tablet Take 1,000 mg by mouth every 6 (six) hours as needed for mild pain or headache.     . ALPRAZolam (XANAX) 0.5 MG tablet Take 1 tablet (0.5 mg total) by mouth daily as needed. for anxiety 30 tablet 5  . amLODipine (NORVASC) 5 MG tablet TAKE 1 TABLET(5 MG) BY MOUTH DAILY 90 tablet 1  . aspirin 81 MG chewable tablet Chew 81 mg by mouth at bedtime.     Marland Kitchen atorvastatin (LIPITOR) 20 MG tablet Take 1 tablet (20 mg total) by mouth daily. 90 tablet 1  . Cholecalciferol (VITAMIN D3) 2000 UNITS TABS Take 2,000 Units by mouth daily after supper.     . clopidogrel (PLAVIX) 75 MG tablet Take 1 tablet (75 mg total) by mouth daily. 90 tablet 1  . Continuous Blood Gluc Sensor (FREESTYLE LIBRE 2 SENSOR) MISC Use to check sugar at least 4 times daily 2 each 2  . gabapentin (NEURONTIN) 600 MG tablet TAKE 1 TABLET(600 MG) BY MOUTH THREE TIMES DAILY (Patient taking differently: 600 mg 3 (three) times daily. ) 270 tablet 1  . insulin degludec (TRESIBA FLEXTOUCH) 100 UNIT/ML FlexTouch Pen Inject 0.28 mLs (28 Units total) into the skin daily. (Patient taking differently: Inject 28 Units into the skin at bedtime. ) 27 mL 3  . Insulin Pen Needle (PEN NEEDLES) 32G X 4 MM MISC Use to take insulin daily 100 each 3  . Lancets Misc. MISC Use to check blood sugars 4 times daily 400 each 3  . losartan (COZAAR) 50 MG tablet Take 1 tablet (50 mg total) by mouth daily. (Patient taking differently: Take 50 mg by mouth every evening. ) 90 tablet 1  . Semaglutide, 1 MG/DOSE, (OZEMPIC, 1 MG/DOSE,) 4 MG/3ML SOPN Inject 0.75 mLs (1 mg total) into the skin once a week. (Patient taking differently: Inject 1 mg into the skin once a week. TUESDAY) 3 mL 2   No current facility-administered medications on file prior to visit.    There are no Patient Instructions on file for this visit. No follow-ups on file.   Kris Hartmann, NP

## 2020-05-21 ENCOUNTER — Ambulatory Visit
Admission: RE | Admit: 2020-05-21 | Discharge: 2020-05-21 | Disposition: A | Discharge status: Home / Self Care | Payer: Medicare Other | Source: Ambulatory Visit | Attending: Internal Medicine | Admitting: Internal Medicine

## 2020-05-21 ENCOUNTER — Other Ambulatory Visit: Payer: Self-pay

## 2020-05-21 DIAGNOSIS — Z1231 Encounter for screening mammogram for malignant neoplasm of breast: Secondary | ICD-10-CM | POA: Insufficient documentation

## 2020-05-26 ENCOUNTER — Other Ambulatory Visit: Payer: Self-pay | Admitting: Internal Medicine

## 2020-05-26 DIAGNOSIS — E1165 Type 2 diabetes mellitus with hyperglycemia: Secondary | ICD-10-CM

## 2020-05-26 DIAGNOSIS — Z794 Long term (current) use of insulin: Secondary | ICD-10-CM

## 2020-05-27 DIAGNOSIS — E118 Type 2 diabetes mellitus with unspecified complications: Secondary | ICD-10-CM | POA: Diagnosis not present

## 2020-05-27 DIAGNOSIS — Z794 Long term (current) use of insulin: Secondary | ICD-10-CM | POA: Diagnosis not present

## 2020-05-28 ENCOUNTER — Ambulatory Visit: Payer: Medicare Other

## 2020-05-29 DIAGNOSIS — E1140 Type 2 diabetes mellitus with diabetic neuropathy, unspecified: Secondary | ICD-10-CM | POA: Diagnosis not present

## 2020-05-29 DIAGNOSIS — B351 Tinea unguium: Secondary | ICD-10-CM | POA: Diagnosis not present

## 2020-05-29 DIAGNOSIS — Z794 Long term (current) use of insulin: Secondary | ICD-10-CM | POA: Diagnosis not present

## 2020-05-30 ENCOUNTER — Ambulatory Visit: Payer: Medicare Other | Admitting: Internal Medicine

## 2020-06-07 ENCOUNTER — Ambulatory Visit (INDEPENDENT_AMBULATORY_CARE_PROVIDER_SITE_OTHER): Payer: Medicare Other

## 2020-06-07 VITALS — Ht 67.0 in | Wt 159.0 lb

## 2020-06-07 DIAGNOSIS — E2839 Other primary ovarian failure: Secondary | ICD-10-CM | POA: Diagnosis not present

## 2020-06-07 DIAGNOSIS — Z0000 Encounter for general adult medical examination without abnormal findings: Secondary | ICD-10-CM

## 2020-06-07 NOTE — Patient Instructions (Addendum)
Laura Reed , Thank you for taking time to come for your Medicare Wellness Visit. I appreciate your ongoing commitment to your health goals. Please review the following plan we discussed and let me know if I can assist you in the future.   These are the goals we discussed:  Goals: Increase physical activity.   This is a list of the screening recommended for you and due dates:  Health Maintenance  Topic Date Due   Eye exam for diabetics  10/19/2020   Hemoglobin A1C  11/06/2020   Complete foot exam   05/29/2021   Pap Smear  12/05/2021   Tetanus Vaccine  03/16/2022   Colon Cancer Screening  04/27/2022   Mammogram  05/21/2022   Flu Shot  Completed   Pneumococcal vaccine  Completed   COVID-19 Vaccine  Completed    Hepatitis C: One time screening is recommended by Center for Disease Control  (CDC) for  adults born from [birth year] through 1967.   Completed   HIV Screening  Completed    Immunizations Immunization History  Administered Date(s) Administered   Influenza Split 03/16/2012   Influenza,inj,Quad PF,6+ Mos 04/06/2013, 03/14/2014, 03/27/2017, 03/25/2018, 05/11/2019, 03/30/2020   Influenza-Unspecified 04/02/2015, 03/14/2016   Moderna SARS-COVID-2 Vaccination 09/28/2019, 10/26/2019   Pneumococcal Conjugate-13 06/15/2014   Pneumococcal Polysaccharide-23 07/07/2010   Tdap 03/16/2012   Keep all routine maintenance appointments.   Follow up CCM 06/21/20 @ 9:00  Follow up 07/04/20 @ 10:30. Request handicap placard request.   Advanced directives: End of life planning; Advance aging; Advanced directives discussed.  Copy of current HCPOA/Living Will requested.    Conditions/risks identified: none new.  Follow up in one year for your annual wellness visit.   Preventive Care 40-64 Years, Female Preventive care refers to lifestyle choices and visits with your health care provider that can promote health and wellness. What does preventive care include?  A yearly  physical exam. This is also called an annual well check.  Dental exams once or twice a year.  Routine eye exams. Ask your health care provider how often you should have your eyes checked.  Personal lifestyle choices, including:  Daily care of your teeth and gums.  Regular physical activity.  Eating a healthy diet.  Avoiding tobacco and drug use.  Limiting alcohol use.  Practicing safe sex.  Taking low-dose aspirin daily starting at age 31.  Taking vitamin and mineral supplements as recommended by your health care provider. What happens during an annual well check? The services and screenings done by your health care provider during your annual well check will depend on your age, overall health, lifestyle risk factors, and family history of disease. Counseling  Your health care provider may ask you questions about your:  Alcohol use.  Tobacco use.  Drug use.  Emotional well-being.  Home and relationship well-being.  Sexual activity.  Eating habits.  Work and work Statistician.  Method of birth control.  Menstrual cycle.  Pregnancy history. Screening  You may have the following tests or measurements:  Height, weight, and BMI.  Blood pressure.  Lipid and cholesterol levels. These may be checked every 5 years, or more frequently if you are over 89 years old.  Skin check.  Lung cancer screening. You may have this screening every year starting at age 46 if you have a 30-pack-year history of smoking and currently smoke or have quit within the past 15 years.  Fecal occult blood test (FOBT) of the stool. You may have this test every year  starting at age 79.  Flexible sigmoidoscopy or colonoscopy. You may have a sigmoidoscopy every 5 years or a colonoscopy every 10 years starting at age 29.  Hepatitis C blood test.  Hepatitis B blood test.  Sexually transmitted disease (STD) testing.  Diabetes screening. This is done by checking your blood sugar (glucose)  after you have not eaten for a while (fasting). You may have this done every 1-3 years.  Mammogram. This may be done every 1-2 years. Talk to your health care provider about when you should start having regular mammograms. This may depend on whether you have a family history of breast cancer.  BRCA-related cancer screening. This may be done if you have a family history of breast, ovarian, tubal, or peritoneal cancers.  Pelvic exam and Pap test. This may be done every 3 years starting at age 67. Starting at age 71, this may be done every 5 years if you have a Pap test in combination with an HPV test.  Bone density scan. This is done to screen for osteoporosis. You may have this scan if you are at high risk for osteoporosis. Discuss your test results, treatment options, and if necessary, the need for more tests with your health care provider. Vaccines  Your health care provider may recommend certain vaccines, such as:  Influenza vaccine. This is recommended every year.  Tetanus, diphtheria, and acellular pertussis (Tdap, Td) vaccine. You may need a Td booster every 10 years.  Zoster vaccine. You may need this after age 75.  Pneumococcal 13-valent conjugate (PCV13) vaccine. You may need this if you have certain conditions and were not previously vaccinated.  Pneumococcal polysaccharide (PPSV23) vaccine. You may need one or two doses if you smoke cigarettes or if you have certain conditions. Talk to your health care provider about which screenings and vaccines you need and how often you need them. This information is not intended to replace advice given to you by your health care provider. Make sure you discuss any questions you have with your health care provider. Document Released: 07/20/2015 Document Revised: 03/12/2016 Document Reviewed: 04/24/2015 Elsevier Interactive Patient Education  2017 [employer] Prevention in the Home Falls can cause injuries. They can happen to  people of all ages. There are many things you can do to make your home safe and to help prevent falls. What can I do on the outside of my home?  Regularly fix the edges of walkways and driveways and fix any cracks.  Remove anything that might make you trip as you walk through a door, such as a raised step or threshold.  Trim any bushes or trees on the path to your home.  Use bright outdoor lighting.  Clear any walking paths of anything that might make someone trip, such as rocks or tools.  Regularly check to see if handrails are loose or broken. Make sure that both sides of any steps have handrails.  Any raised decks and porches should have guardrails on the edges.  Have any leaves, snow, or ice cleared regularly.  Use sand or salt on walking paths during winter.  Clean up any spills in your garage right away. This includes oil or grease spills. What can I do in the bathroom?  Use night lights.  Install grab bars by the toilet and in the tub and shower. Do not use towel bars as grab bars.  Use non-skid mats or decals in the tub or shower.  If you need to  sit down in the shower, use a plastic, non-slip stool.  Keep the floor dry. Clean up any water that spills on the floor as soon as it happens.  Remove soap buildup in the tub or shower regularly.  Attach bath mats securely with double-sided non-slip rug tape.  Do not have throw rugs and other things on the floor that can make you trip. What can I do in the bedroom?  Use night lights.  Make sure that you have a light by your bed that is easy to reach.  Do not use any sheets or blankets that are too big for your bed. They should not hang down onto the floor.  Have a firm chair that has side arms. You can use this for support while you get dressed.  Do not have throw rugs and other things on the floor that can make you trip. What can I do in the kitchen?  Clean up any spills right away.  Avoid walking on wet  floors.  Keep items that you use a lot in easy-to-reach places.  If you need to reach something above you, use a strong step stool that has a grab bar.  Keep electrical cords out of the way.  Do not use floor polish or wax that makes floors slippery. If you must use wax, use non-skid floor wax.  Do not have throw rugs and other things on the floor that can make you trip. What can I do with my stairs?  Do not leave any items on the stairs.  Make sure that there are handrails on both sides of the stairs and use them. Fix handrails that are broken or loose. Make sure that handrails are as long as the stairways.  Check any carpeting to make sure that it is firmly attached to the stairs. Fix any carpet that is loose or worn.  Avoid having throw rugs at the top or bottom of the stairs. If you do have throw rugs, attach them to the floor with carpet tape.  Make sure that you have a light switch at the top of the stairs and the bottom of the stairs. If you do not have them, ask someone to add them for you. What else can I do to help prevent falls?  Wear shoes that:  Do not have high heels.  Have rubber bottoms.  Are comfortable and fit you well.  Are closed at the toe. Do not wear sandals.  If you use a stepladder:  Make sure that it is fully opened. Do not climb a closed stepladder.  Make sure that both sides of the stepladder are locked into place.  Ask someone to hold it for you, if possible.  Clearly mark and make sure that you can see:  Any grab bars or handrails.  First and last steps.  Where the edge of each step is.  Use tools that help you move around (mobility aids) if they are needed. These include:  Canes.  Walkers.  Scooters.  Crutches.  Turn on the lights when you go into a dark area. Replace any light bulbs as soon as they burn out.  Set up your furniture so you have a clear path. Avoid moving your furniture around.  If any of your floors are  uneven, fix them.  If there are any pets around you, be aware of where they are.  Review your medicines with your doctor. Some medicines can make you feel dizzy. This can increase your chance  of falling. Ask your doctor what other things that you can do to help prevent falls. This information is not intended to replace advice given to you by your health care provider. Make sure you discuss any questions you have with your health care provider. Document Released: 04/19/2009 Document Revised: 11/29/2015 Document Reviewed: 07/28/2014 Elsevier Interactive Patient Education  2017 [employer].

## 2020-06-07 NOTE — Progress Notes (Addendum)
Subjective:   Laura Reed is a 59 y.o. female who presents for Medicare Annual (Subsequent) preventive examination.  Review of Systems    No ROS.  Medicare Wellness Virtual Visit.   Cardiac Risk Factors include: advanced age (>55men, >91 women);diabetes mellitus     Objective:    Today's Vitals   06/07/20 1237  Weight: 159 lb (72.1 kg)  Height: 5\' 7"  (1.702 m)   Body mass index is 24.9 kg/m.  Advanced Directives 03/19/2020 03/08/2020 12/06/2019 05/26/2019 01/18/2019 01/18/2019 07/16/2018  Does Patient Have a Medical Advance Directive? Yes Yes Yes Yes No;Yes No No;Yes  Type of [employer] [place];Living will [place];Living will Healthcare Power of [place];Living will  Does patient want to make changes to medical advance directive? No - Patient declined - No - Patient declined No - Patient declined No - Patient declined - No - Patient declined  Copy of [place] in Chart? No - copy requested - Yes - validated most recent copy scanned in chart (See row information) No - copy requested No - copy requested - No - copy requested  Would patient like information on creating a medical advance directive? - - - - No - Patient declined No - Patient declined Yes (Inpatient - patient requests chaplain consult to create a medical advance directive)    Current Medications (verified) Outpatient Encounter Medications as of 06/07/2020  Medication Sig  . acetaminophen (TYLENOL) 500 MG tablet Take 1,000 mg by mouth every 6 (six) hours as needed for mild pain or headache.   . ALPRAZolam (XANAX) 0.5 MG tablet Take 1 tablet (0.5 mg total) by mouth daily as needed. for anxiety  . amLODipine (NORVASC) 5 MG tablet TAKE 1 TABLET(5 MG) BY MOUTH DAILY  . aspirin 81 MG chewable tablet Chew 81 mg by mouth at bedtime.   Marland Kitchen atorvastatin (LIPITOR) 20 MG tablet Take 1 tablet (20 mg total) by  mouth daily.  . Cholecalciferol (VITAMIN D3) 2000 UNITS TABS Take 2,000 Units by mouth daily after supper.   . clopidogrel (PLAVIX) 75 MG tablet Take 1 tablet (75 mg total) by mouth daily.  . Continuous Blood Gluc Sensor (FREESTYLE LIBRE 2 SENSOR) MISC Use to check sugar at least 4 times daily  . gabapentin (NEURONTIN) 600 MG tablet TAKE 1 TABLET(600 MG) BY MOUTH THREE TIMES DAILY (Patient taking differently: 600 mg 3 (three) times daily. )  . insulin degludec (TRESIBA FLEXTOUCH) 100 UNIT/ML FlexTouch Pen Inject 0.28 mLs (28 Units total) into the skin daily. (Patient taking differently: Inject 28 Units into the skin at bedtime. )  . Insulin Pen Needle (PEN NEEDLES) 32G X 4 MM MISC Use to take insulin daily  . Lancets Misc. MISC Use to check blood sugars 4 times daily  . losartan (COZAAR) 50 MG tablet Take 1 tablet (50 mg total) by mouth daily. (Patient taking differently: Take 50 mg by mouth every evening. )  . OZEMPIC, 1 MG/DOSE, 4 MG/3ML SOPN INJECT 1 MG UNDER THE SKIN ONCE WEEKLY   No facility-administered encounter medications on file as of 06/07/2020.    Allergies (verified) Patient has no known allergies.   History: Past Medical History:  Diagnosis Date  . Absence of kidney    left  . Anxiety   . Arthritis   . Bladder cancer ([place])   . CHF (congestive heart failure) ([place])   . Complication of anesthesia  BP HAS  RUN LOW AFTER SURGERY-LUNGS FILLED UP WITH FLUID AFTER  LEG STENT SURGERY   . Coronary artery disease   . Diabetes mellitus   . Family history of adverse reaction to anesthesia    Sister - PONV  . GERD (gastroesophageal reflux disease)    OCC TUMS  . Heart murmur   . Hemorrhoid   . History of methicillin resistant staphylococcus aureus (MRSA) 2007  . Hypertension   . Neuropathy   . PVD (peripheral vascular disease) ([place])   . Thyroid nodule    right  . Urothelial carcinoma of kidney ([place]) 10/31/2014   INVASIVE UROTHELIAL CARCINOMA, LOW GRADE. T1, Nx.  . Wears  dentures    full upper and lower   Past Surgical History:  Procedure Laterality Date  . AMPUTATION TOE     right (4th and 5th); left (great toe, 3rd)  . AMPUTATION TOE Right 07/16/2018   Procedure: AMPUTATION TOE/MPJ right 2nd;  Surgeon: Sharlotte Alamo, DPM;  Location: [facility];  Service: Podiatry;  Laterality: Right;  . ARTERIAL BYPASS SURGRY  2009, 2013 x 2   right leg , done in [place]  . CARDIAC CATHETERIZATION    . CAROTID ENDARTERECTOMY Right 01/2014   Dr Delana Meyer  . CATARACT EXTRACTION W/PHACO Right 12/14/2014   Procedure: CATARACT EXTRACTION PHACO AND INTRAOCULAR LENS PLACEMENT (IOC);  Surgeon: Lyla Glassing, MD;  Location: [facility];  Service: Ophthalmology;  Laterality: Right;  [place]   00:38.6              AP        7.1                   CDE  2.76  . CATARACT EXTRACTION W/PHACO Left 12/06/2019   Procedure: CATARACT EXTRACTION PHACO AND INTRAOCULAR LENS PLACEMENT ([place]) LEFT DIABETIC;  Surgeon: Birder Robson, MD;  Location: [facility];  Service: Ophthalmology;  Laterality: Left;  9.08 1:06.4  . CESAREAN SECTION    . CHOLECYSTECTOMY  03-03-12   Porcelain gallbladder, gallstones,  [place]  . COLONOSCOPY W/ BIOPSIES  04/28/2012   Hyperplastic rectal polyps.  . CORONARY ARTERY BYPASS GRAFT  2009   3 vessel  . CYSTOSCOPY W/ RETROGRADES Right 09/01/2016   Procedure: CYSTOSCOPY WITH RETROGRADE PYELOGRAM;  Surgeon: Hollice Espy, MD;  Location: [facility];  Service: Urology;  Laterality: Right;  . CYSTOSCOPY W/ RETROGRADES Bilateral 03/19/2020   Procedure: CYSTOSCOPY WITH RETROGRADE PYELOGRAM;  Surgeon: Hollice Espy, MD;  Location: [facility];  Service: Urology;  Laterality: Bilateral;  . CYSTOSCOPY WITH BIOPSY N/A 03/19/2020   Procedure: CYSTOSCOPY WITH BIOPSY;  Surgeon: Hollice Espy, MD;  Location: [facility];  Service: Urology;  Laterality: N/A;  . EYE SURGERY    . HERNIA REPAIR  10-31-14   ventral, retro-rectus atrium mesh  . IRRIGATION AND DEBRIDEMENT FOOT Left 01/18/2019    Procedure: IRRIGATION AND DEBRIDEMENT FOOT;  Surgeon: Sharlotte Alamo, DPM;  Location: [facility];  Service: Podiatry;  Laterality: Left;  . LOWER EXTREMITY ANGIOGRAPHY Left 12/10/2016   Procedure: Lower Extremity Angiography;  Surgeon: Katha Cabal, MD;  Location: [facility];  Service: Cardiovascular;  Laterality: Left;  . LOWER EXTREMITY ANGIOGRAPHY Left 02/02/2018   Procedure: LOWER EXTREMITY ANGIOGRAPHY;  Surgeon: Katha Cabal, MD;  Location: [place] [facility];  Service: Cardiovascular;  Laterality: Left;  . LOWER EXTREMITY ANGIOGRAPHY Left 05/05/2018   Procedure: LOWER EXTREMITY ANGIOGRAPHY;  Surgeon: Katha Cabal, MD;  Location: [place] [facility];  Service: Cardiovascular;  Laterality: Left;  . NEPHRECTOMY Left 10-31-14  . PERIPHERAL VASCULAR CATHETERIZATION Left 05/01/2015   Procedure: Lower Extremity Angiography;  Surgeon: Katha Cabal, MD;  Location: [place] [facility];  Service: Cardiovascular;  Laterality: Left;  . PERIPHERAL VASCULAR CATHETERIZATION  05/01/2015   Procedure: Lower Extremity Intervention;  Surgeon: Katha Cabal, MD;  Location: [place] [facility];  Service: Cardiovascular;;  . PERIPHERAL VASCULAR CATHETERIZATION Left 02/20/2015   Procedure: Pelvic Angiography;  Surgeon: Katha Cabal, MD;  Location: [place] [facility];  Service: Cardiovascular;  Laterality: Left;  . TRANSURETHRAL RESECTION OF BLADDER TUMOR WITH MITOMYCIN-C N/A 09/01/2016   Procedure: TRANSURETHRAL RESECTION OF BLADDER TUMOR WITH MITOMYCIN-C;  Surgeon: Hollice Espy, MD;  Location: [facility];  Service: Urology;  Laterality: N/A;   Family History  Problem Relation Age of Onset  . Cancer Mother 74       Lung Cancer  . Cancer Father 44       Lung Ca  . Diabetes Son   . Breast cancer Maternal Grandmother   . Kidney cancer Neg Hx   . Bladder Cancer Neg Hx   . Prostate cancer Neg Hx    Social History   Socioeconomic History  . Marital status: Single     Spouse name: Not on file  . Number of children: Not on file  . Years of education: Not on file  . Highest education level: Not on file  Occupational History  . Not on file  Tobacco Use  . Smoking status: Former Smoker    Packs/day: 2.00    Years: 35.00    Pack years: 70.00    Types: Cigarettes    Quit date: 03/29/2013    Years since quitting: 7.1  . Smokeless tobacco: Never Used  Vaping Use  . Vaping Use: Former  Substance and Sexual Activity  . Alcohol use: Not Currently    Alcohol/week: 0.0 standard drinks    Comment: LAST DRINK 2009  . Drug use: Not Currently    Types: Cocaine    Comment: last used in 2007  . Sexual activity: Not Currently  Other Topics Concern  . Not on file  Social History Narrative  . Not on file   Social Determinants of Health   Financial Resource Strain: Low Risk   . Difficulty of Paying Living Expenses: Not hard at all  Food Insecurity: No Food Insecurity  . Worried About Charity fundraiser in the Last Year: Never true  . Ran Out of Food in the Last Year: Never true  Transportation Needs: No Transportation Needs  . Lack of Transportation (Medical): No  . Lack of Transportation (Non-Medical): No  Physical Activity: Unknown  . Days of Exercise per Week: 0 days  . Minutes of Exercise per Session: Not on file  Stress: No Stress Concern Present  . Feeling of Stress : Not at all  Social Connections: Unknown  . Frequency of Communication with Friends and Family: More than three times a week  . Frequency of Social Gatherings with Friends and Family: More than three times a week  . Attends Religious Services: Not on file  . Active Member of Clubs or Organizations: Not on file  . Attends Archivist Meetings: Not on file  . Marital Status: Not on file    Tobacco Counseling Counseling given: Not Answered   Clinical Intake:  Pre-visit preparation completed: Yes        Diabetes: Yes (Follow by PCP)  How often do  you need to  have someone help you when you read instructions, pamphlets, or other written materials from your doctor or pharmacy?: 1 - Never  Nutrition Risk Assessment: Has the patient had any N/V/D within the last 2 months?  No  Does the patient have any non-healing wounds?  No  Has the patient had any unintentional weight loss or weight gain?  No   Diabetes: If diabetic, was a CBG obtained today?  No  Did the patient bring in their glucometer from home?  No  How often do you monitor your CBG's? Freestyle [place], daily.   Financial Strains and Diabetes Management: Are you having any financial strains with the device, your supplies or your medication? No .  Does the patient want to be seen by Chronic Care Management for management of their diabetes?  No  Would the patient like to be referred to a Nutritionist or for Diabetic Management?  No   Diabetic Exams: Diabetic Eye Exam: Completed 10/20/19 Diabetic Foot Exam: Completed 05/29/20  Interpreter Needed?: No      Activities of Daily Living In your present state of health, do you have any difficulty performing the following activities: 06/07/2020 03/08/2020  Hearing? N N  Vision? N N  Difficulty concentrating or making decisions? N N  Walking or climbing stairs? N N  Dressing or bathing? N N  Doing errands, shopping? N N  Preparing Food and eating ? N -  Using the Toilet? N -  In the past six months, have you accidently leaked urine? N -  Do you have problems with loss of bowel control? N -  Managing your Medications? N -  Managing your Finances? N -  Housekeeping or managing your Housekeeping? N -  Some recent data might be hidden    Patient Care Team: [name], MD as PCP - General (Internal Medicine) Bary Castilla, Forest Gleason, MD (General Surgery) [name], MD (Internal Medicine) De Hollingshead, RPH-CPP as Pharmacist (Pharmacist)  Indicate any recent Medical Services you may have received from other than [facility] providers  in the past year (date may be approximate).     Assessment:   This is a routine wellness examination for Laura Reed.  I connected with Laura Reed today by telephone and verified that I am speaking with the correct person using two identifiers. Location patient: home Location provider: work Persons participating in the virtual visit: patient, Marine scientist.    I discussed the limitations, risks, security and privacy concerns of performing an evaluation and management service by telephone and the availability of in person appointments. The patient expressed understanding and verbally consented to this telephonic visit.    Interactive audio and video telecommunications were attempted between this provider and patient, however failed, due to patient having technical difficulties OR patient did not have access to video capability.  We continued and completed visit with audio only.  Some vital signs may be absent or patient reported.   Hearing/Vision screen  Hearing Screening   125Hz  250Hz  500Hz  1000Hz  2000Hz  3000Hz  4000Hz  6000Hz  8000Hz   Right ear:           Left ear:           Comments: Patient is able to hear conversational tones without difficulty. No issues reported.  Vision Screening Comments: Followed by [facility]  Wears corrective lenses  Cataract extraction, bilateral Injections every 6 weeks Visual acuity not assessed per patient preference since they have regular follow up with the ophthalmologist  Dietary issues and  exercise activities discussed: Current Exercise Habits: The patient does not participate in regular exercise at present (Active around the home)   Goals: Increase physical activity.   Depression Screen PHQ 2/9 Scores 06/07/2020 03/30/2020 05/26/2019 05/21/2018 03/27/2017 04/09/2016 11/14/2015  PHQ - 2 Score 0 0 0 0 0 0 0  PHQ- 9 Score - - - - 0 - -    Fall Risk Fall Risk  06/07/2020 03/30/2020 12/28/2019 09/26/2019 05/26/2019  Falls in the past year? 1 0 0 0 0  Number  falls in past yr: 0 0 - - -  Comment - - - - -  Injury with Fall? 0 0 - - -  Comment - - - - -  Risk for fall due to : - - - - -  Risk for fall due to: Comment - - - - -  Follow up Falls evaluation completed Falls evaluation completed Falls evaluation completed Falls evaluation completed Falls evaluation completed   Handrails in use when climbing stairs? Yes Home free of loose throw rugs in walkways, pet beds, electrical cords, etc? Yes  Adequate lighting in your home to reduce risk of falls? Yes   ASSISTIVE DEVICES UTILIZED TO PREVENT FALLS: Use of a cane, walker or w/c? No   TIMED UP AND GO: Was the test performed? No . Virtual visit.   Cognitive Function: MMSE - Mini Mental State Exam 05/21/2018 03/27/2017  Orientation to time 5 5  Orientation to Place 5 5  Registration 3 3  Attention/ Calculation 5 5  Recall 3 3  Language- name 2 objects 2 2  Language- repeat 1 1  Language- follow 3 step command 3 3  Language- read & follow direction 1 1  Write a sentence 1 1  Copy design 1 1  Total score 30 30     6CIT Screen 06/07/2020 05/26/2019  What Year? 0 points 0 points  What month? 0 points 0 points  What time? - 0 points  Count back from 20 - 0 points  Months in reverse 0 points 0 points    Immunizations Immunization History  Administered Date(s) Administered  . Influenza Split 03/16/2012  . Influenza,inj,Quad PF,6+ Mos 04/06/2013, 03/14/2014, 03/27/2017, 03/25/2018, 05/11/2019, 03/30/2020  . Influenza-Unspecified 04/02/2015, 03/14/2016  . Moderna SARS-COVID-2 Vaccination 09/28/2019, 10/26/2019  . Pneumococcal Conjugate-13 06/15/2014  . Pneumococcal Polysaccharide-23 07/07/2010  . Tdap 03/16/2012   Health Maintenance Health Maintenance  Topic Date Due  . OPHTHALMOLOGY EXAM  10/19/2020  . HEMOGLOBIN A1C  11/06/2020  . FOOT EXAM  05/29/2021  . PAP SMEAR-Modifier  12/05/2021  . TETANUS/TDAP  03/16/2022  . COLONOSCOPY  04/27/2022  . MAMMOGRAM  05/21/2022  .  INFLUENZA VACCINE  Completed  . PNEUMOCOCCAL POLYSACCHARIDE VACCINE AGE 39-64 HIGH RISK  Completed  . COVID-19 Vaccine  Completed  . Hepatitis C Screening  Completed  . HIV Screening  Completed    Colorectal cancer screening: Completed 04/27/12. Repeat every 10 years.  Mammogram status: Completed 05/21/20. Repeat every year. Bilateral TOMO and CAD.  Dexa Scan- 12/05/08. Taking Vitamin D3 2000 [place].   Lung Cancer Screening: (Low Dose CT Chest recommended if Age 81-80 years, 30 pack-year currently smoking OR have quit w/in 15years.) does not qualify.   Hepatitis C Screening: Completed 05/18/15.  Vision Screening: Recommended annual ophthalmology exams for early detection of glaucoma and other disorders of the eye. Is the patient up to date with their annual eye exam?  Yes  Who is the provider or what is the name of  the office in which the patient attends annual eye exams? [facility].  Dental Screening: Recommended annual dental exams for proper oral hygiene.  Dentures.   Community Resource Referral / Chronic Care Management: CRR required this visit?  No   CCM required this visit?  No      Plan:   Keep all routine maintenance appointments.   Follow up CCM 06/21/20 @ 9:00  Follow up 07/04/20 @ 10:30.    I have personally reviewed and noted the following in the patient's chart:   . Medical and social history . Use of alcohol, tobacco or illicit drugs  . Current medications and supplements . Functional ability and status . Nutritional status . Physical activity . Advanced directives . List of other physicians . Hospitalizations, surgeries, and ER visits in previous 12 months . Vitals . Screenings to include cognitive, depression, and falls . Referrals and appointments  In addition, I have reviewed and discussed with patient certain preventive protocols, quality metrics, and best practice recommendations. A written personalized care plan for preventive services as  well as general preventive health recommendations were provided to patient via mychart.     Varney Biles, LPN   [id number]   Reviewed above information.  Agree with assessment and plan.   Dr Nicki Reaper

## 2020-06-08 DIAGNOSIS — E113512 Type 2 diabetes mellitus with proliferative diabetic retinopathy with macular edema, left eye: Secondary | ICD-10-CM | POA: Diagnosis not present

## 2020-06-19 ENCOUNTER — Other Ambulatory Visit: Payer: Self-pay

## 2020-06-21 ENCOUNTER — Other Ambulatory Visit: Payer: Self-pay

## 2020-06-21 ENCOUNTER — Ambulatory Visit: Payer: Medicare Other | Admitting: Pharmacist

## 2020-06-21 VITALS — BP 124/66

## 2020-06-21 DIAGNOSIS — E1142 Type 2 diabetes mellitus with diabetic polyneuropathy: Secondary | ICD-10-CM

## 2020-06-21 DIAGNOSIS — Z794 Long term (current) use of insulin: Secondary | ICD-10-CM

## 2020-06-21 DIAGNOSIS — I10 Essential (primary) hypertension: Secondary | ICD-10-CM

## 2020-06-21 DIAGNOSIS — I739 Peripheral vascular disease, unspecified: Secondary | ICD-10-CM

## 2020-06-21 MED ORDER — EMPAGLIFLOZIN 10 MG PO TABS: 10.0000 mg | ORAL_TABLET | Freq: Every day | ORAL | 1 refills | Status: DC

## 2020-06-21 MED ORDER — TRESIBA FLEXTOUCH 100 UNIT/ML ~~LOC~~ SOPN: 22.0000 [IU] | PEN_INJECTOR | Freq: Every day | SUBCUTANEOUS | 3 refills | Status: DC

## 2020-06-21 NOTE — Chronic Care Management (AMB) (Signed)
**Note Laura-Identified via Obfuscation** Chronic Care Management   Pharmacy Note  06/21/2020 Name: Laura Reed MRN: [id number] DOB: 1960-08-18  Subjective:  Laura Reed is a 59 y.o. year old female who is a primary care patient of [name], [name], MD. The CCM team was consulted for assistance with chronic disease management and care coordination needs.    Engaged with patient face to face for follow up visit in response to provider referral for pharmacy case management and/or care coordination services.   Consent to Services:  Laura Reed was given information about Chronic Care Management services, agreed to services, and gave verbal consent prior to initiation of services on 11/21/19. Please see initial visit note for detailed documentation.   Objective:  Lab Results  Component Value Date   CREATININE 0.89 05/09/2020   CREATININE 0.91 12/28/2019   CREATININE 0.87 09/26/2019    Lab Results  Component Value Date   HGBA1C 8.5 (H) 05/09/2020       Component Value Date/Time   CHOL 105 05/09/2020 1529   TRIG 59.0 05/09/2020 1529   HDL 50.20 05/09/2020 1529   CHOLHDL 2 05/09/2020 1529   VLDL 11.8 05/09/2020 1529   LDLCALC 43 05/09/2020 1529   LDLDIRECT 47.0 01/28/2017 1509      BP Readings from Last 3 Encounters:  05/14/20 (!) 84/53  03/30/20 132/60  03/19/20 (!) 98/52    Assessment/Interventions: Review of patient past medical history, allergies, medications, health status, including review of consultants reports, laboratory and other test data, was performed as part of comprehensive evaluation and provision of chronic care management services.   SDOH (Social Determinants of Health) assessments and interventions performed:  SDOH Interventions   Flowsheet Row Most Recent Value  SDOH Interventions   SDOH Interventions for the Following Domains Tobacco, Stress  Financial Strain Interventions Intervention Not Indicated  Stress Interventions Provide Counseling, Other (Comment)  [recent illness with cats.  Provided empathetic listening]  Tobacco Interventions Intervention Not Indicated  [confirmed avoidance of tobacco]       CCM Care Plan  No Known Allergies  Medications Reviewed Today    Reviewed by Laura Reed, RPH-CPP (Pharmacist) on 06/21/20 at [id number]  Med List Status: <None>  Medication Order Taking? Sig Documenting Provider Last Dose Status Informant  acetaminophen (TYLENOL) 500 MG tablet [id number]  Take 1,000 mg by mouth every 6 (six) hours as needed for mild pain or headache.  [provider]  Active Self           Med Note Darnelle Maffucci, Arville Lime   Tue Feb 21, 2020  9:49 AM) Using ~2-3 times weekly  ALPRAZolam Duanne Moron) 0.5 MG tablet [id number]  Take 1 tablet (0.5 mg total) by mouth daily as needed. for anxiety [name], MD  Active            Med Note ([place] Feb 21, 2020  9:49 AM) Using ~ twice weekly for sleep  amLODipine (NORVASC) 5 MG tablet [id number] Yes TAKE 1 TABLET(5 MG) BY MOUTH DAILY [name], MD Taking Active   aspirin 81 MG chewable tablet [id number] Yes Chew 81 mg by mouth at bedtime.  [provider] Taking Active Self  atorvastatin (LIPITOR) 20 MG tablet [id number] Yes Take 1 tablet (20 mg total) by mouth daily. [name], MD Taking Active   Cholecalciferol (VITAMIN D3) 2000 UNITS TABS [id number] Yes Take 2,000 Units by mouth daily after supper.  [provider] Taking Active Self  clopidogrel (PLAVIX) 75 MG  tablet [id number] Yes Take 1 tablet (75 mg total) by mouth daily. [name], MD Taking Active   Continuous Blood Gluc Sensor (FREESTYLE LIBRE 2 SENSOR) [place] [id number] Yes Use to check sugar at least 4 times daily [name], MD Taking Active   gabapentin (NEURONTIN) 600 MG tablet [id number] Yes TAKE 1 TABLET(600 MG) BY MOUTH THREE TIMES DAILY  Patient taking differently: 600 mg 3 (three) times daily.   [name], MD Taking Active   insulin degludec (TRESIBA FLEXTOUCH) 100 UNIT/ML  FlexTouch Pen [id number] Yes Inject 0.28 mLs (28 Units total) into the skin daily.  Patient taking differently: Inject 28 Units into the skin at bedtime.   [name], MD Taking Active   Insulin Pen Needle (PEN NEEDLES) 32G X 4 MM MISC [id number] Yes Use to take insulin daily [name], MD Taking Active   Lancets Misc. [place] [id number] Yes Use to check blood sugars 4 times daily [name], MD Taking Active   losartan (COZAAR) 50 MG tablet [id number] Yes Take 1 tablet (50 mg total) by mouth daily.  Patient taking differently: Take 50 mg by mouth every evening.   [name], MD Taking Active   OZEMPIC, 1 MG/DOSE, 4 MG/3ML [place] [id number] Yes INJECT 1 MG UNDER THE SKIN ONCE WEEKLY [name], MD Taking Active           Patient Active Problem List   Diagnosis Date Noted  . Acute hypotension 04/01/2020  . Suspected COVID-19 virus infection 04/01/2020  . Anxiety state 04/01/2017  . Atherosclerosis of native arteries of extremity with intermittent claudication ([place]) 01/18/2017  . Carotid stenosis 12/04/2016  . Insomnia secondary to anxiety 02/21/2016  . Thyroid nodule 02/21/2016  . Hospital discharge follow-up 11/11/2015  . Encounter for preventive health examination 08/02/2015  . S/p nephrectomy 05/15/2015  . Diabetic nephropathy with proteinuria ([place]) 01/27/2015  . Overweight (BMI 25.0-29.9) 10/07/2014  . Renal cancer ([place]) 09/28/2014  . CAD (coronary artery disease) 08/24/2014  . Hernia of abdominal cavity 08/10/2014  . Hyperlipidemia associated with type 2 diabetes mellitus ([place]) 06/17/2014  . S/P carotid endarterectomy 01/22/2014  . History of shingles 05/18/2013  . Preoperative evaluation of a medical condition to rule out surgical contraindications (TAR required) 04/06/2013  . Diabetic peripheral neuropathy associated with type 2 diabetes mellitus ([place]) 04/06/2013  . Chronic hip pain 01/16/2013  . Routine general medical examination at a health care facility  06/26/2012  . PVD (peripheral vascular disease) ([place])   . History of tobacco abuse 04/29/2011  . Controlled type 2 DM with peripheral circulatory disorder ([place])   . Hypertension   . Hemorrhoid     Conditions to be addressed/monitored per PCP order: HTN, HLD and DMII  Patient Care Plan: Medication Management    Problem Identified: Diabetes, HTN, HLD, CKD, CAD     Long-Range Goal: Disease Progression Prevention   This Visit's Progress: On track  Priority: High  Note:   Current Barriers:  . Unable to achieve control of diabetes  . Suboptimal therapeutic regimen for diabetes  Pharmacist Clinical Goal(s):  Marland Kitchen Over the next 90 days, patient will achieve control of diabetes as evidenced by A1c  through collaboration with PharmD and provider.   Interventions: . 1:1 collaboration with [name], MD regarding development and update of comprehensive plan of care as evidenced by provider attestation and co-signature . Inter-disciplinary care team collaboration (see longitudinal plan of care) . Comprehensive medication review performed; medication list updated  in electronic medical record  Diabetes: . Uncontrolled; current treatment: Ozempic 1 mg weekly, Tresiba 28 units daily o Resistant to metformin d/t things she has heard about lawsuits.  . Current glucose readings:  Date of Download: 12/3-12/16/21 % Time CGM is active: 95% Average Glucose: 140 mg/dL Glucose Management Indicator: 6.7  Glucose Variability: 25.8 (goal <36%) Time in Goal:  - Time in range 70-180: 84% - Time above range: 14% - Time below range: 2% Observed patterns: post prandial elevations, particularly after her pre-bedtime snack. Has always been concerned with risk of overnight hypoglycemia, so has been eating snacks. Has shifted towards lower carb, more protein snacks . Discussed benefit of SGLT2 given ASCVD, CKD. Patient amenable. Start Jardiance 10 mg daily. Reduce Tresiba to 22 units daily to reduce risk of  hypoglycemia. Continue Ozempic 1 mg weekly. Counseled on hydration. Encouraged to check BP to evaluate diuretic effect on BP . Educated on setting up home [facility] account. She will set this up and send me a message, and I will send the email to link. She will plan to upload readings prior to future appointments.   Hypertension: . Controlled; current treatment: amlodipine 5 mg daily, losartan 50 mg daily  . Current home readings: not checking . Continue current regimen and add BID BP checks between now and PCP visit next week. Can reduce amlodipine if diuretic effect of SGLT2 drops BP  Hyperlipidemia and secondary ASCVD risk reduction (severe PVD) . Controlled; current treatment: atorvastatin 20 mg daily  . Antiplatelet regimen: aspirin 81 mg daily and clopidogrel 75 mg daily  . Recommended to continue current regimen at this time   Patient Goals/Self-Care Activities . Over the next 90 days, patient will:  - take medications as prescribed check glucose TID using CGM, document, and provide at future appointments check blood pressure BID, document, and provide at future appointments  Follow Up Plan: Telephone follow up appointment with care management team member scheduled for: ~ 6 weeks       Medication Assistance: None required. Patient affirms current coverage meets needs.   Plan: Telephone follow up appointment with care management team member scheduled for: ~ 6 weeks  [name], PharmD, [place], [place] Clinical Pharmacist [employer] at [employer] [phone]

## 2020-06-21 NOTE — Patient Instructions (Addendum)
Keep up the [place]!!!  We are starting a new medication - Jardiance 10 mg. Take this medication in the morning. Focus on staying well hydrated.   Please check your blood pressures at home and bring them with you when you see Dr. Derrel Nip next week.   DECREASE Tresiba to 22 units daily to reduce your risk of hypoglycemia with starting the Jardiance. Please send [place] a MyChart message or call me if you end up still having low blood sugars, and we can decrease the [place] more.   Continue Ozempic 1 mg weekly, along with your other medications as prescribed.   Take care!  [name], PharmD [phone]  Visit Information  Patient Care Plan: Medication Management    Problem Identified: Diabetes, HTN, HLD, CKD, CAD     Long-Range Goal: Disease Progression Prevention   This Visit's Progress: On track  Priority: High  Note:   Current Barriers:  . Unable to achieve control of diabetes  . Suboptimal therapeutic regimen for diabetes  Pharmacist Clinical Goal(s):  Marland Kitchen Over the next 90 days, patient will achieve control of diabetes as evidenced by A1c  through collaboration with PharmD and provider.   Interventions: . 1:1 collaboration with [name], MD regarding development and update of comprehensive plan of care as evidenced by provider attestation and co-signature . Inter-disciplinary care team collaboration (see longitudinal plan of care) . Comprehensive medication review performed; medication list updated in electronic medical record  Diabetes: . Uncontrolled; current treatment: Ozempic 1 mg weekly, Tresiba 28 units daily o Resistant to metformin d/t things she has heard about lawsuits.  . Current glucose readings:  Date of Download: 12/3-12/16/21 % Time CGM is active: 95% Average Glucose: 140 mg/dL Glucose Management Indicator: 6.7  Glucose Variability: 25.8 (goal <36%) Time in Goal:  - Time in range 70-180: 84% - Time above range: 14% - Time below range:  2% Observed patterns: post prandial elevations, particularly after her pre-bedtime snack. Has always been concerned with risk of overnight hypoglycemia, so has been eating snacks. Has shifted towards lower carb, more protein snacks . Discussed benefit of SGLT2 given ASCVD, CKD. Patient amenable. Start Jardiance 10 mg daily. Reduce Tresiba to 22 units daily to reduce risk of hypoglycemia. Continue Ozempic 1 mg weekly. Counseled on hydration. Encouraged to check BP to evaluate diuretic effect on BP . Educated on setting up home [facility] account. She will set this up and send me a message, and I will send the email to link. She will plan to upload readings prior to future appointments.   Hypertension: . Controlled; current treatment: amlodipine 5 mg daily, losartan 50 mg daily  . Current home readings: not checking . Continue current regimen and add BID BP checks between now and PCP visit next week. Can reduce amlodipine if diuretic effect of SGLT2 drops BP  Hyperlipidemia and secondary ASCVD risk reduction (severe PVD) . Controlled; current treatment: atorvastatin 20 mg daily  . Antiplatelet regimen: aspirin 81 mg daily and clopidogrel 75 mg daily  . Recommended to continue current regimen at this time   Patient Goals/Self-Care Activities . Over the next 90 days, patient will:  - take medications as prescribed check glucose TID using CGM, document, and provide at future appointments check blood pressure BID, document, and provide at future appointments  Follow Up Plan: Telephone follow up appointment with care management team member scheduled for: ~ 6 weeks        Print copy of patient instructions, educational materials, and  care plan provided in person.  Medication Assistance: None required. Patient affirms current coverage meets needs.   Plan: Telephone follow up appointment with care management team member scheduled for: ~ 6 weeks  [name], PharmD, [place], [place] Clinical  Pharmacist [employer] at [employer] [phone]

## 2020-06-27 DIAGNOSIS — E118 Type 2 diabetes mellitus with unspecified complications: Secondary | ICD-10-CM | POA: Diagnosis not present

## 2020-06-27 DIAGNOSIS — Z794 Long term (current) use of insulin: Secondary | ICD-10-CM | POA: Diagnosis not present

## 2020-07-04 ENCOUNTER — Ambulatory Visit (INDEPENDENT_AMBULATORY_CARE_PROVIDER_SITE_OTHER): Payer: Medicare Other | Admitting: Internal Medicine

## 2020-07-04 ENCOUNTER — Other Ambulatory Visit: Payer: Self-pay | Admitting: Internal Medicine

## 2020-07-04 ENCOUNTER — Other Ambulatory Visit: Payer: Self-pay

## 2020-07-04 ENCOUNTER — Encounter: Payer: Self-pay | Admitting: Internal Medicine

## 2020-07-04 VITALS — BP 138/60 | HR 78 | Temp 98.2°F | Resp 15 | Ht 67.0 in | Wt 155.8 lb

## 2020-07-04 DIAGNOSIS — E1142 Type 2 diabetes mellitus with diabetic polyneuropathy: Secondary | ICD-10-CM | POA: Diagnosis not present

## 2020-07-04 DIAGNOSIS — I70299 Other atherosclerosis of native arteries of extremities, unspecified extremity: Secondary | ICD-10-CM

## 2020-07-04 DIAGNOSIS — I10 Essential (primary) hypertension: Secondary | ICD-10-CM | POA: Diagnosis not present

## 2020-07-04 DIAGNOSIS — I70213 Atherosclerosis of native arteries of extremities with intermittent claudication, bilateral legs: Secondary | ICD-10-CM | POA: Diagnosis not present

## 2020-07-04 DIAGNOSIS — E1121 Type 2 diabetes mellitus with diabetic nephropathy: Secondary | ICD-10-CM

## 2020-07-04 DIAGNOSIS — L97909 Non-pressure chronic ulcer of unspecified part of unspecified lower leg with unspecified severity: Secondary | ICD-10-CM

## 2020-07-04 DIAGNOSIS — I739 Peripheral vascular disease, unspecified: Secondary | ICD-10-CM | POA: Diagnosis not present

## 2020-07-04 NOTE — Patient Instructions (Addendum)
For your allergies and runny nose ,  You can use Benadryl but you should also consider adding one of these newer second generation antihistamines that are longer acting, non sedating and  available OTC:  Generic  Zyrtec, which is cetirizine.    generic Allegra , available generically as fexofenadine ; comes in 60 mg and 180 mg once daily strengths.    Generic Claritin :  also available as loratidine .    For your blood sugars, change the following:   Eat the peanut butter sandwhich earlier,   for breakfast around   6 am,  And eat the banana  at noon  (to reduce the early afternoon lows )   Move the tresiba to dinner time starting tonight . Continue 22 units   We will repeat your a1c Feb 3 or after    For the blood pressure:   Suspend 5 mg amlodipine dose if your BP  drops below 90, OR IF  you feel bad when BP is < 110

## 2020-07-04 NOTE — Telephone Encounter (Signed)
RX Refill: xanax Last Seen:07-04-20 Last ordered: 12-30-19

## 2020-07-04 NOTE — Progress Notes (Signed)
Subjective:  Patient ID: Laura Reed, female    DOB: July 08, 1960  Age: 59 y.o. MRN: [id number]  CC: The primary encounter diagnosis was Diabetic nephropathy with proteinuria ([place]). Diagnoses of Atherosclerosis of native artery of both lower extremities with intermittent claudication ([place]), Diabetic peripheral neuropathy associated with type 2 diabetes mellitus ([place]), Primary hypertension, and PVD (peripheral vascular disease) ([place]) were also pertinent to this visit.  HPI LEATTA JUMAN presents for follow up on uncontrolled type 2 DM with neuropathy, hyperlipidemia, , hypertension and PAD+  This visit occurred during the SARS-CoV-2 public health emergency.  Safety protocols were in place, including screening questions prior to the visit, additional usage of staff PPE, and extensive cleaning of exam room while observing appropriate contact time as indicated for disinfecting solutions.   Hypertension with recent episode of  hypotension. She has been taking amlodipine 5 mg daily and losartan 50 mg daily  Home BP readings have been reviewed for the last several days and have ranged from 98/50 to  126/61  .  No symptoms of dizziness or fatigue   T2DM:  . FreeStyle [employer] monitor report reviewed. Her blood sugars are In range 84% of the time.  Lunch is at 2 pm.  Dinner at 8 .    No lows. Post prandials are higher than goal .  Taking 22 units of Tresiba,  Jardiance 10 mg daily , and Ozempic weekly   Severe PAD:  She has no current ulcerations  And is keeping regular follow up with podiatry and vascular surgery   Outpatient Medications Prior to Visit  Medication Sig Dispense Refill  . acetaminophen (TYLENOL) 500 MG tablet Take 1,000 mg by mouth every 6 (six) hours as needed for mild pain or headache.     Marland Kitchen amLODipine (NORVASC) 5 MG tablet TAKE 1 TABLET(5 MG) BY MOUTH DAILY 90 tablet 1  . aspirin 81 MG chewable tablet Chew 81 mg by mouth at bedtime.     Marland Kitchen atorvastatin (LIPITOR) 20 MG tablet Take 1  tablet (20 mg total) by mouth daily. 90 tablet 1  . Cholecalciferol (VITAMIN D3) 2000 UNITS TABS Take 2,000 Units by mouth daily after supper.     . clopidogrel (PLAVIX) 75 MG tablet Take 1 tablet (75 mg total) by mouth daily. 90 tablet 1  . Continuous Blood Gluc Sensor (FREESTYLE LIBRE 2 SENSOR) MISC Use to check sugar at least 4 times daily 2 each 2  . empagliflozin (JARDIANCE) 10 MG TABS tablet Take 1 tablet (10 mg total) by mouth daily before breakfast. 90 tablet 1  . gabapentin (NEURONTIN) 600 MG tablet TAKE 1 TABLET(600 MG) BY MOUTH THREE TIMES DAILY (Patient taking differently: 600 mg 3 (three) times daily.) 270 tablet 1  . insulin degludec (TRESIBA FLEXTOUCH) 100 UNIT/ML FlexTouch Pen Inject 22 Units into the skin daily. 21 mL 3  . Insulin Pen Needle (PEN NEEDLES) 32G X 4 MM MISC Use to take insulin daily 100 each 3  . Lancets Misc. MISC Use to check blood sugars 4 times daily 400 each 3  . losartan (COZAAR) 50 MG tablet Take 1 tablet (50 mg total) by mouth daily. (Patient taking differently: Take 50 mg by mouth every evening.) 90 tablet 1  . OZEMPIC, 1 MG/DOSE, 4 MG/3ML SOPN INJECT 1 MG UNDER THE SKIN ONCE WEEKLY 3 mL 2  . ALPRAZolam (XANAX) 0.5 MG tablet Take 1 tablet (0.5 mg total) by mouth daily as needed. for anxiety 30 tablet 5   No facility-administered  medications prior to visit.    Review of Systems;  Patient denies headache, fevers, malaise, unintentional weight loss, skin rash, eye pain, sinus congestion and sinus pain, sore throat, dysphagia,  hemoptysis , cough, dyspnea, wheezing, chest pain, palpitations, orthopnea, edema, abdominal pain, nausea, melena, diarrhea, constipation, flank pain, dysuria, hematuria, urinary  Frequency, nocturia, numbness, tingling, seizures,  Focal weakness, Loss of consciousness,  Tremor, insomnia, depression, anxiety, and suicidal ideation.      Objective:  BP 138/60 (BP Location: Left Arm, Patient Position: Sitting, Cuff Size: Normal)    Pulse 78   Temp 98.2 F (36.8 C) (Oral)   Resp 15   Ht 5\' 7"  (1.702 m)   Wt 155 lb 12.8 oz (70.7 kg)   SpO2 99%   BMI 24.40 kg/m   BP Readings from Last 3 Encounters:  07/04/20 138/60  06/21/20 124/66  05/14/20 (!) 84/53    Wt Readings from Last 3 Encounters:  07/04/20 155 lb 12.8 oz (70.7 kg)  06/07/20 159 lb (72.1 kg)  05/14/20 159 lb (72.1 kg)    General appearance: alert, cooperative and appears stated age Ears: normal TM's and external ear canals both ears Throat: lips, mucosa, and tongue normal; teeth and gums normal Neck: no adenopathy, no carotid bruit, supple, symmetrical, trachea midline and thyroid not enlarged, symmetric, no tenderness/mass/nodules Back: symmetric, no curvature. ROM normal. No CVA tenderness. Lungs: clear to auscultation bilaterally Heart: regular rate and rhythm, S1, S2 normal, no murmur, click, rub or gallop Abdomen: soft, non-tender; bowel sounds normal; no masses,  no organomegaly Pulses: 2+ and symmetric Skin: Skin color, texture, turgor normal. No rashes or lesions Lymph nodes: Cervical, supraclavicular, and axillary nodes normal.  Lab Results  Component Value Date   HGBA1C 8.5 (H) 05/09/2020   HGBA1C 6.3 12/28/2019   HGBA1C 8.8 (H) 09/26/2019    Lab Results  Component Value Date   CREATININE 0.89 05/09/2020   CREATININE 0.91 12/28/2019   CREATININE 0.87 09/26/2019    Lab Results  Component Value Date   WBC 9.2 03/15/2020   HGB 13.6 03/15/2020   HCT 37.4 03/15/2020   PLT 274 03/15/2020   GLUCOSE 103 (H) 05/09/2020   CHOL 105 05/09/2020   TRIG 59.0 05/09/2020   HDL 50.20 05/09/2020   LDLDIRECT 47.0 01/28/2017   LDLCALC 43 05/09/2020   ALT 16 05/09/2020   AST 14 05/09/2020   NA 135 05/09/2020   K 4.7 05/09/2020   CL 99 05/09/2020   CREATININE 0.89 05/09/2020   BUN 16 05/09/2020   CO2 28 05/09/2020   TSH 0.96 05/18/2015   INR 0.9 10/17/2014   HGBA1C 8.5 (H) 05/09/2020   MICROALBUR 18.0 (H) 05/26/2019    MM 3D  SCREEN BREAST BILATERAL  Result Date: 05/23/2020 CLINICAL DATA:  Screening. EXAM: DIGITAL SCREENING BILATERAL MAMMOGRAM WITH TOMO AND CAD COMPARISON:  Previous exam(s). ACR Breast Density Category b: There are scattered areas of fibroglandular density. FINDINGS: There are no findings suspicious for malignancy. Images were processed with CAD. IMPRESSION: No mammographic evidence of malignancy. A result letter of this screening mammogram will be mailed directly to the patient. RECOMMENDATION: Screening mammogram in one year. (Code:SM-B-01Y) BI-RADS CATEGORY  1: Negative. Electronically Signed   By: Lillia Mountain M.D.   On: 05/23/2020 14:47    Assessment & Plan:   Problem List Items Addressed This Visit      Unprioritized   Atherosclerosis of native arteries of extremity with intermittent claudication ([place])    Feet were examined today and  well perfused with no open ulcerations      Diabetic nephropathy with proteinuria (HCC) - Primary   Relevant Orders   Hemoglobin A1c   Comprehensive metabolic panel   Lipid panel   Diabetic peripheral neuropathy associated with type 2 diabetes mellitus (HCC)    She has improved her glycemic control  Since her November a1c based on review of CBG monitor data for the last month.  No changes to regimen were made today but she was advised to take her insulin dose earlier and to eat a higher protein breakfast . Continue statin, asa , and ARB  Lab Results  Component Value Date   HGBA1C 8.5 (H) 05/09/2020   Lab Results  Component Value Date   CHOL 105 05/09/2020   HDL 50.20 05/09/2020   LDLCALC 43 05/09/2020   LDLDIRECT 47.0 01/28/2017   TRIG 59.0 05/09/2020   CHOLHDL 2 05/09/2020         Hypertension    Currently taking 5 mg amlodipine and 50 mg losartan with home readings all under 130/80 .advised to suspend amlodipine for systolic < 90 or for symptoms of fatigue/dizziness with any systolic reading  < 110      PVD (peripheral vascular disease) (HCC)     She maintains regular follow up with vascular surgery to surveillance of carotids and iliac disease with prior surgical interventions         I   30 minutes  Was spent  reviewing patient's current problems and recent encounters with hes specialists, reviewing and ordering  labs and imaging studies, providing counseling on the above mentioned problems , and evaluating patient  In a face to face visit  .  I am having Gae Bon. Charlet maintain her Vitamin D3, aspirin, acetaminophen, Lancets Misc., atorvastatin, amLODipine, clopidogrel, losartan, gabapentin, Pen Needles, FreeStyle Libre 2 Sensor, Ozempic (1 MG/DOSE), empagliflozin, and Tresiba FlexTouch.  No orders of the defined types were placed in this encounter.   There are no discontinued medications.  Follow-up: Return in about 5 weeks (around 08/10/2020).   Sherlene Shams, MD

## 2020-07-06 DIAGNOSIS — L97909 Non-pressure chronic ulcer of unspecified part of unspecified lower leg with unspecified severity: Secondary | ICD-10-CM | POA: Insufficient documentation

## 2020-07-06 DIAGNOSIS — I70299 Other atherosclerosis of native arteries of extremities, unspecified extremity: Secondary | ICD-10-CM | POA: Insufficient documentation

## 2020-07-06 NOTE — Assessment & Plan Note (Signed)
Currently taking 5 mg amlodipine and 50 mg losartan with home readings all under 130/80 .advised to suspend amlodipine for systolic < 90 or for symptoms of fatigue/dizziness with any systolic reading  < 110

## 2020-07-06 NOTE — Assessment & Plan Note (Signed)
She maintains regular follow up with vascular surgery to surveillance of carotids and iliac disease with prior surgical interventions

## 2020-07-06 NOTE — Assessment & Plan Note (Signed)
Feet were examined today and well perfused with no open ulcerations

## 2020-07-06 NOTE — Assessment & Plan Note (Addendum)
She has improved her glycemic control  Since her November a1c based on review of CBG monitor data for the last month.  No changes to regimen were made today but she was advised to take her insulin dose earlier and to eat a higher protein breakfast . Continue statin, asa , and ARB  Lab Results  Component Value Date   HGBA1C 8.5 (H) 05/09/2020   Lab Results  Component Value Date   CHOL 105 05/09/2020   HDL 50.20 05/09/2020   LDLCALC 43 05/09/2020   LDLDIRECT 47.0 01/28/2017   TRIG 59.0 05/09/2020   CHOLHDL 2 05/09/2020

## 2020-07-11 DIAGNOSIS — E113512 Type 2 diabetes mellitus with proliferative diabetic retinopathy with macular edema, left eye: Secondary | ICD-10-CM | POA: Diagnosis not present

## 2020-07-11 LAB — HM DIABETES EYE EXAM

## 2020-07-13 ENCOUNTER — Other Ambulatory Visit: Payer: Self-pay | Admitting: Internal Medicine

## 2020-07-19 ENCOUNTER — Encounter: Payer: Self-pay | Admitting: Internal Medicine

## 2020-07-19 DIAGNOSIS — E113512 Type 2 diabetes mellitus with proliferative diabetic retinopathy with macular edema, left eye: Secondary | ICD-10-CM | POA: Diagnosis not present

## 2020-07-21 ENCOUNTER — Other Ambulatory Visit: Payer: Self-pay | Admitting: Internal Medicine

## 2020-07-28 DIAGNOSIS — Z794 Long term (current) use of insulin: Secondary | ICD-10-CM | POA: Diagnosis not present

## 2020-07-28 DIAGNOSIS — E118 Type 2 diabetes mellitus with unspecified complications: Secondary | ICD-10-CM | POA: Diagnosis not present

## 2020-08-03 ENCOUNTER — Telehealth: Payer: Medicare Other

## 2020-08-03 ENCOUNTER — Telehealth: Payer: Self-pay | Admitting: Pharmacist

## 2020-08-03 NOTE — Telephone Encounter (Signed)
  Chronic Care Management   Note  08/03/2020 Name: Laura Reed MRN: [id number] DOB: 1960-11-14   Attempted to contact patient for scheduled appointment for medication management support. Left HIPAA compliant message for patient to return my call at their convenience.    Plan: - If I do not hear back from the patient by end of business today, will collaborate with Care Guide to outreach to schedule follow up with me   [name], PharmD, [place], [place] Pharmacist [employer] at [employer] [phone]

## 2020-08-06 ENCOUNTER — Other Ambulatory Visit: Payer: Medicare Other

## 2020-08-07 NOTE — Telephone Encounter (Signed)
Patient called me back. Appointment r/s.

## 2020-08-08 ENCOUNTER — Other Ambulatory Visit: Payer: Self-pay

## 2020-08-08 ENCOUNTER — Encounter: Payer: Self-pay | Admitting: Internal Medicine

## 2020-08-08 ENCOUNTER — Ambulatory Visit (INDEPENDENT_AMBULATORY_CARE_PROVIDER_SITE_OTHER): Payer: Medicare Other | Admitting: Internal Medicine

## 2020-08-08 VITALS — BP 136/58 | HR 82 | Temp 98.6°F | Ht 67.01 in | Wt 155.0 lb

## 2020-08-08 DIAGNOSIS — E1142 Type 2 diabetes mellitus with diabetic polyneuropathy: Secondary | ICD-10-CM | POA: Diagnosis not present

## 2020-08-08 DIAGNOSIS — E1151 Type 2 diabetes mellitus with diabetic peripheral angiopathy without gangrene: Secondary | ICD-10-CM

## 2020-08-08 MED ORDER — GABAPENTIN 800 MG PO TABS
800.0000 mg | ORAL_TABLET | Freq: Three times a day (TID) | ORAL | 3 refills | Status: DC
Start: 2020-08-08 — End: 2021-08-12

## 2020-08-08 MED ORDER — AMLODIPINE BESYLATE 5 MG PO TABS
ORAL_TABLET | ORAL | 1 refills | Status: DC
Start: 2020-08-08 — End: 2021-01-17

## 2020-08-08 NOTE — Patient Instructions (Addendum)
PLEASE RETURN FOR YOUR LABS TOMORROW  OR THE DAY AFTER.     I RECOMMEND THAT YOU KEEP A SMALL SUPPLY OF THE NOVOLOG INSULIN HANDY IN CASE YOU NEED TO FINE TUNE YOUR SUGARS

## 2020-08-08 NOTE — Progress Notes (Signed)
Subjective:  Patient ID: Laura Reed, female    DOB: 02-20-1961  Age: 60 y.o. MRN: [id number]  CC: The primary encounter diagnosis was Controlled type 2 DM with peripheral circulatory disorder ([place]). A diagnosis of Diabetic peripheral neuropathy associated with type 2 diabetes mellitus ([place]) was also pertinent to this visit.  HPI DAILY DOE presents for diabetes follow up  This visit occurred during the SARS-CoV-2 public health emergency.  Safety protocols were in place, including screening questions prior to the visit, additional usage of staff PPE, and extensive cleaning of exam room while observing appropriate contact time as indicated for disinfecting solutions.   She has been Using a cbg monitor since last visit with Catie.  Readings have not been conveyed yet due to technical issues.  She has cut out the refined sugar,  Using atkins bars to manage sugar cravings .  ozempic  Is curbing appetite.  No longer using mealtime insulin and using tresiba 22 units   , Jardiance once daily .  Has not had to use the novolog in several weeks  Had a low this  morning of 68   The monitor alarmed.  Ate cheese toast and  Repeat cbg was 128  Average sugar for the past 3-4 weeks was  150  Hypertension: patient checks blood pressure twice weekly at home.  Readings have been for the most part < 140/80 at rest with no hypotensive episodes  . Patient is following a reduce salt diet most days and is taking medications as prescribed. .    Some balance issues due to loss of toes  . No falls.   Outpatient Medications Prior to Visit  Medication Sig Dispense Refill  . acetaminophen (TYLENOL) 500 MG tablet Take 1,000 mg by mouth every 6 (six) hours as needed for mild pain or headache.     . ALPRAZolam (XANAX) 0.5 MG tablet TAKE 1 TABLET(0.5 MG) BY MOUTH DAILY AS NEEDED FOR ANXIETY 30 tablet 5  . aspirin 81 MG chewable tablet Chew 81 mg by mouth at bedtime.     Marland Kitchen atorvastatin (LIPITOR) 20 MG tablet Take 1  tablet (20 mg total) by mouth daily. 90 tablet 1  . Cholecalciferol (VITAMIN D3) 2000 UNITS TABS Take 2,000 Units by mouth daily after supper.     . clopidogrel (PLAVIX) 75 MG tablet TAKE 1 TABLET(75 MG) BY MOUTH DAILY 90 tablet 1  . Continuous Blood Gluc Sensor (FREESTYLE LIBRE 2 SENSOR) MISC Use to check sugar at least 4 times daily 2 each 2  . empagliflozin (JARDIANCE) 10 MG TABS tablet Take 1 tablet (10 mg total) by mouth daily before breakfast. 90 tablet 1  . insulin degludec (TRESIBA FLEXTOUCH) 100 UNIT/ML FlexTouch Pen Inject 22 Units into the skin daily. 21 mL 3  . Insulin Pen Needle (PEN NEEDLES) 32G X 4 MM MISC Use to take insulin daily 100 each 3  . Lancets Misc. MISC Use to check blood sugars 4 times daily 400 each 3  . losartan (COZAAR) 50 MG tablet TAKE 1 TABLET(50 MG) BY MOUTH DAILY 90 tablet 1  . OZEMPIC, 1 MG/DOSE, 4 MG/3ML SOPN INJECT 1 MG UNDER THE SKIN ONCE WEEKLY 3 mL 2  . amLODipine (NORVASC) 5 MG tablet TAKE 1 TABLET(5 MG) BY MOUTH DAILY 90 tablet 1  . gabapentin (NEURONTIN) 600 MG tablet TAKE 1 TABLET(600 MG) BY MOUTH THREE TIMES DAILY (Patient taking differently: 600 mg 3 (three) times daily.) 270 tablet 1   No facility-administered medications prior  to visit.     Review of Systems;  Patient denies headache, fevers, malaise, unintentional weight loss, skin rash, eye pain, sinus congestion and sinus pain, sore throat, dysphagia,  hemoptysis , cough, dyspnea, wheezing, chest pain, palpitations, orthopnea, edema, abdominal pain, nausea, melena, diarrhea, constipation, flank pain, dysuria, hematuria, urinary  Frequency, nocturia, numbness, tingling, seizures,  Focal weakness, Loss of consciousness,  Tremor, insomnia, depression, anxiety, and suicidal ideation.      Objective:  BP (!) 136/58 (BP Location: Left Arm, Patient Position: Sitting)   Pulse 82   Temp 98.6 F (37 C)   Ht 5' 7.01" (1.702 m)   Wt 155 lb (70.3 kg)   SpO2 99%   BMI 24.27 kg/m   BP Readings  from Last 3 Encounters:  08/08/20 (!) 136/58  07/04/20 138/60  06/21/20 124/66    Wt Readings from Last 3 Encounters:  08/08/20 155 lb (70.3 kg)  07/04/20 155 lb 12.8 oz (70.7 kg)  06/07/20 159 lb (72.1 kg)    General appearance: alert, cooperative and appears stated age Ears: normal TM's and external ear canals both ears Throat: lips, mucosa, and tongue normal; teeth and gums normal Neck: no adenopathy, no carotid bruit, supple, symmetrical, trachea midline and thyroid not enlarged, symmetric, no tenderness/mass/nodules Back: symmetric, no curvature. ROM normal. No CVA tenderness. Lungs: clear to auscultation bilaterally Heart: regular rate and rhythm, S1, S2 normal, no murmur, click, rub or gallop Abdomen: soft, non-tender; bowel sounds normal; no masses,  no organomegaly Pulses: 2+ and symmetric Skin: Skin color, texture, turgor normal. No rashes or lesions Lymph nodes: Cervical, supraclavicular, and axillary nodes normal.  Lab Results  Component Value Date   HGBA1C 8.5 (H) 05/09/2020   HGBA1C 6.3 12/28/2019   HGBA1C 8.8 (H) 09/26/2019    Lab Results  Component Value Date   CREATININE 0.89 05/09/2020   CREATININE 0.91 12/28/2019   CREATININE 0.87 09/26/2019    Lab Results  Component Value Date   WBC 9.2 03/15/2020   HGB 13.6 03/15/2020   HCT 37.4 03/15/2020   PLT 274 03/15/2020   GLUCOSE 103 (H) 05/09/2020   CHOL 105 05/09/2020   TRIG 59.0 05/09/2020   HDL 50.20 05/09/2020   LDLDIRECT 47.0 01/28/2017   LDLCALC 43 05/09/2020   ALT 16 05/09/2020   AST 14 05/09/2020   NA 135 05/09/2020   K 4.7 05/09/2020   CL 99 05/09/2020   CREATININE 0.89 05/09/2020   BUN 16 05/09/2020   CO2 28 05/09/2020   TSH 0.96 05/18/2015   INR 0.9 10/17/2014   HGBA1C 8.5 (H) 05/09/2020   MICROALBUR 18.0 (H) 05/26/2019    MM 3D SCREEN BREAST BILATERAL  Result Date: 05/23/2020 CLINICAL DATA:  Screening. EXAM: DIGITAL SCREENING BILATERAL MAMMOGRAM WITH TOMO AND CAD COMPARISON:   Previous exam(s). ACR Breast Density Category b: There are scattered areas of fibroglandular density. FINDINGS: There are no findings suspicious for malignancy. Images were processed with CAD. IMPRESSION: No mammographic evidence of malignancy. A result letter of this screening mammogram will be mailed directly to the patient. RECOMMENDATION: Screening mammogram in one year. (Code:SM-B-01Y) BI-RADS CATEGORY  1: Negative. Electronically Signed   By: Lillia Mountain M.D.   On: 05/23/2020 14:47    Assessment & Plan:   Problem List Items Addressed This Visit      Unprioritized   Controlled type 2 DM with peripheral circulatory disorder ([place]) - Primary    Currently improved control using CBG monitor and dietary adherence  Without  recurrent hypoglycemic  events.  Patient is using [place] at 22 units, Ozempic  1 mg weekly , Jardiance 10 mg daily and a sliding scale insulin regimen given manage normoglycemia per meal.   Patient has no microalbuminuria. Patient is tolerating statin therapy for CAD risk reduction and on ACE/ARB for renal protection and hypertension.  She will return for repeat A1c     Lab Results  Component Value Date   LABMICR See below: 03/09/2020   LABMICR See below: 02/28/2020   MICROALBUR 18.0 (H) 05/26/2019   MICROALBUR 6.6 (H) 09/11/2017           Relevant Medications   amLODipine (NORVASC) 5 MG tablet   Other Relevant Orders   For home use only DME Other see comment   Diabetic peripheral neuropathy associated with type 2 diabetes mellitus ([place])   Relevant Medications   gabapentin (NEURONTIN) 800 MG tablet   Other Relevant Orders   For home use only DME Other see comment      I have changed Hoyle Sauer E. Whitaker's gabapentin. I am also having her maintain her Vitamin D3, aspirin, acetaminophen, Lancets Misc., atorvastatin, Pen Needles, FreeStyle Libre 2 Sensor, Ozempic (1 MG/DOSE), empagliflozin, Tresiba FlexTouch, ALPRAZolam, losartan, clopidogrel, and amLODipine.  Meds  ordered this encounter  Medications  . gabapentin (NEURONTIN) 800 MG tablet    Sig: Take 1 tablet (800 mg total) by mouth 3 (three) times daily.    Dispense:  270 tablet    Refill:  3    NOTE DOSE INCREASE TO 800 MG  . amLODipine (NORVASC) 5 MG tablet    Sig: TAKE 1 TABLET(5 MG) BY MOUTH DAILY    Dispense:  90 tablet    Refill:  1    Medications Discontinued During This Encounter  Medication Reason  . gabapentin (NEURONTIN) 600 MG tablet   . amLODipine (NORVASC) 5 MG tablet Reorder   I spent 20 minutes dedicated to the care of this patient on the date of this encounter to include pre-visit review of his medical history,  Face-to-face time with the patient , and post visit ordering of testing and therapeutics.  Follow-up: Return in about 3 months (around 11/05/2020).   [name], MD

## 2020-08-09 ENCOUNTER — Other Ambulatory Visit: Payer: Medicare Other

## 2020-08-09 NOTE — Assessment & Plan Note (Addendum)
Currently improved control using CBG monitor and dietary adherence  Without  recurrent hypoglycemic events.  Patient is using [place] at 22 units, Ozempic  1 mg weekly , Jardiance 10 mg daily and a sliding scale insulin regimen given manage normoglycemia per meal.   Patient has no microalbuminuria. Patient is tolerating statin therapy for CAD risk reduction and on ACE/ARB for renal protection and hypertension.  She will return for repeat A1c     Lab Results  Component Value Date   LABMICR See below: 03/09/2020   LABMICR See below: 02/28/2020   MICROALBUR 18.0 (H) 05/26/2019   MICROALBUR 6.6 (H) 09/11/2017

## 2020-08-13 ENCOUNTER — Ambulatory Visit (INDEPENDENT_AMBULATORY_CARE_PROVIDER_SITE_OTHER): Payer: Medicare Other | Admitting: Pharmacist

## 2020-08-13 DIAGNOSIS — E785 Hyperlipidemia, unspecified: Secondary | ICD-10-CM | POA: Diagnosis not present

## 2020-08-13 DIAGNOSIS — I739 Peripheral vascular disease, unspecified: Secondary | ICD-10-CM

## 2020-08-13 DIAGNOSIS — E1169 Type 2 diabetes mellitus with other specified complication: Secondary | ICD-10-CM

## 2020-08-13 DIAGNOSIS — I10 Essential (primary) hypertension: Secondary | ICD-10-CM

## 2020-08-13 DIAGNOSIS — E1165 Type 2 diabetes mellitus with hyperglycemia: Secondary | ICD-10-CM | POA: Diagnosis not present

## 2020-08-13 DIAGNOSIS — Z794 Long term (current) use of insulin: Secondary | ICD-10-CM

## 2020-08-13 DIAGNOSIS — I70213 Atherosclerosis of native arteries of extremities with intermittent claudication, bilateral legs: Secondary | ICD-10-CM

## 2020-08-13 NOTE — Patient Instructions (Signed)
Visit Information  PATIENT GOALS: Goals Addressed              This Visit's Progress     Patient Stated   .  Medication Monitoring (pt-stated)        Patient Goals/Self-Care Activities . Over the next 90 days, patient will:  - take medications as prescribed check glucose TID using CGM, document, and provide at future appointments check blood pressure BID, document, and provide at future appointments       Patient verbalizes understanding of instructions provided today and agrees to view in [place].    Plan: Telephone follow up appointment with care management team member scheduled for:  ~ 6 weeks  [name], PharmD, [place], [place] Clinical Pharmacist [employer] at [employer] [phone]

## 2020-08-13 NOTE — Chronic Care Management (AMB) (Signed)
Chronic Care Management Pharmacy Note  08/13/2020 Name:  Laura Reed MRN:  [id number] DOB:  September 25, 1960  Subjective: Laura Reed is an 60 y.o. year old female who is a primary patient of Derrel Nip, [name], MD.  The CCM team was consulted for assistance with disease management and care coordination needs.    Engaged with patient by telephone for response to her call about [facility] connection in response to provider referral for pharmacy case management and/or care coordination services.   Consent to Services:  The patient was given information about Chronic Care Management services, agreed to services, and gave verbal consent prior to initiation of services.  Please see initial visit note for detailed documentation.   Objective:  Lab Results  Component Value Date   CREATININE 0.89 05/09/2020   CREATININE 0.91 12/28/2019   CREATININE 0.87 09/26/2019    Lab Results  Component Value Date   HGBA1C 8.5 (H) 05/09/2020       Component Value Date/Time   CHOL 105 05/09/2020 1529   TRIG 59.0 05/09/2020 1529   HDL 50.20 05/09/2020 1529   CHOLHDL 2 05/09/2020 1529   VLDL 11.8 05/09/2020 1529   LDLCALC 43 05/09/2020 1529   LDLDIRECT 47.0 01/28/2017 1509    BP Readings from Last 3 Encounters:  08/08/20 (!) 136/58  07/04/20 138/60  06/21/20 124/66    Assessment: Review of patient past medical history, allergies, medications, health status, including review of consultants reports, laboratory and other test data, was performed as part of comprehensive evaluation and provision of chronic care management services.   SDOH:  (Social Determinants of Health) assessments and interventions performed:  SDOH Interventions   Flowsheet Row Most Recent Value  SDOH Interventions   Financial Strain Interventions Intervention Not Indicated      CCM Care Plan  No Known Allergies  Medications Reviewed Today    Reviewed by De Hollingshead, RPH-CPP (Pharmacist) on 08/13/20 at [place] List Status: <None>  Medication Order Taking? Sig Documenting Provider Last Dose Status Informant  acetaminophen (TYLENOL) 500 MG tablet [id number]  Take 1,000 mg by mouth every 6 (six) hours as needed for mild pain or headache.  [provider]  Active Self           Med Note [name] Feb 21, 2020  9:49 AM) Using ~2-3 times weekly  ALPRAZolam Duanne Moron) 0.5 MG tablet [id number]  TAKE 1 TABLET(0.5 MG) BY MOUTH DAILY AS NEEDED FOR ANXIETY [name], MD  Active   amLODipine (NORVASC) 5 MG tablet [id number]  TAKE 1 TABLET(5 MG) BY MOUTH DAILY [name], MD  Active   aspirin 81 MG chewable tablet [id number]  Chew 81 mg by mouth at bedtime.  [provider]  Active Self  atorvastatin (LIPITOR) 20 MG tablet [id number]  Take 1 tablet (20 mg total) by mouth daily. [name], MD  Active   Cholecalciferol (VITAMIN D3) 2000 UNITS TABS [id number]  Take 2,000 Units by mouth daily after supper.  [provider]  Active Self  clopidogrel (PLAVIX) 75 MG tablet [id number]  TAKE 1 TABLET(75 MG) BY MOUTH DAILY [name], MD  Active   Continuous Blood Gluc Sensor (FREESTYLE LIBRE 2 SENSOR) [place] [id number]  Use to check sugar at least 4 times daily [name], MD  Active   empagliflozin (JARDIANCE) 10 MG TABS tablet [id number]  Take 1 tablet (10 mg total) by mouth daily before breakfast. Deborra Medina  L, MD  Active   gabapentin (NEURONTIN) 800 MG tablet [id number]  Take 1 tablet (800 mg total) by mouth 3 (three) times daily. [name], MD  Active   insulin degludec (TRESIBA FLEXTOUCH) 100 UNIT/ML FlexTouch Pen [id number]  Inject 22 Units into the skin daily. [name], MD  Active   Insulin Pen Needle (PEN NEEDLES) 32G X 4 MM MISC [id number]  Use to take insulin daily [name], MD  Active   Lancets Misc. [place] [id number]  Use to check blood sugars 4 times daily [name], MD  Active   losartan (COZAAR) 50 MG tablet [id number]  TAKE 1  TABLET(50 MG) BY MOUTH DAILY [name], MD  Active   OZEMPIC, 1 MG/DOSE, 4 MG/3ML SOPN [id number]  INJECT 1 MG UNDER THE SKIN ONCE WEEKLY [name], MD  Active           Patient Active Problem List   Diagnosis Date Noted  . Atherosclerosis of artery of extremity with ulceration ([place]) 07/06/2020  . Anxiety state 04/01/2017  . Atherosclerosis of native arteries of extremity with intermittent claudication ([place]) 01/18/2017  . Carotid stenosis 12/04/2016  . Insomnia secondary to anxiety 02/21/2016  . Thyroid nodule 02/21/2016  . Hospital discharge follow-up 11/11/2015  . Encounter for preventive health examination 08/02/2015  . S/p nephrectomy 05/15/2015  . Diabetic nephropathy with proteinuria ([place]) 01/27/2015  . Overweight (BMI 25.0-29.9) 10/07/2014  . History of renal cell carcinoma 09/28/2014  . CAD (coronary artery disease) 08/24/2014  . Hernia of abdominal cavity 08/10/2014  . Hyperlipidemia associated with type 2 diabetes mellitus ([place]) 06/17/2014  . S/P carotid endarterectomy 01/22/2014  . History of shingles 05/18/2013  . Preoperative evaluation of a medical condition to rule out surgical contraindications (TAR required) 04/06/2013  . Diabetic peripheral neuropathy associated with type 2 diabetes mellitus ([place]) 04/06/2013  . Chronic hip pain 01/16/2013  . Routine general medical examination at a health care facility 06/26/2012  . PVD (peripheral vascular disease) ([place])   . History of tobacco abuse 04/29/2011  . Controlled type 2 DM with peripheral circulatory disorder ([place])   . Hypertension   . Hemorrhoid     Conditions to be addressed/monitored: HTN, HLD and DMII  Care Plan : Medication Management  Updates made by De Hollingshead, RPH-CPP since 08/13/2020 12:00 AM    Problem: Diabetes, HTN, HLD, CKD, CAD     Long-Range Goal: Disease Progression Prevention   This Visit's Progress: On track  Recent Progress: On track  Priority: High  Note:   Current  Barriers:  . Unable to achieve control of diabetes  . Suboptimal therapeutic regimen for diabetes  Pharmacist Clinical Goal(s):  Marland Kitchen Over the next 90 days, patient will achieve control of diabetes as evidenced by A1c  through collaboration with PharmD and provider.   Interventions: . 1:1 collaboration with [name], MD regarding development and update of comprehensive plan of care as evidenced by provider attestation and co-signature . Inter-disciplinary care team collaboration (see longitudinal plan of care) . Comprehensive medication review performed; medication list updated in electronic medical record  Diabetes: . Uncontrolled; current treatment: Ozempic 1 mg weekly, Tresiba 22 units daily, Jardiance 10 mg daily  o Resistant to metformin d/t things she has heard about lawsuits.  . Current glucose readings: calls today to ask if CGM readings are transmitting via [facility] Date of Download: 1/22-08/10/20 % Time CGM is active: 96% Average Glucose: 154 mg/dL Glucose Management Indicator: 7.0%  Glucose Variability: 23.8 (goal <36%) Time in Goal:  - Time in range 70-180: 78% - Time above range: 22% - Time below range: 0% Observed patterns: post prandial elevations, particularly after supper . Reviewed elevated post prandial readings. Discussed evaluating post prandial readings and adjusting mealtime carbohydrate content to target post prandial readings <180 . Recommend to continue current regimen at this time. Labwork pending.   Hypertension: . Controlled; current treatment: amlodipine 5 mg daily, losartan 50 mg daily  . Current home readings: <140/80 . Recommend to continue current regimen at this time  Hyperlipidemia and secondary ASCVD risk reduction (severe PVD) . Controlled; current treatment: atorvastatin 20 mg daily  . Antiplatelet regimen: aspirin 81 mg daily and clopidogrel 75 mg daily  . Recommended to continue current regimen at this time   Patient Goals/Self-Care  Activities . Over the next 90 days, patient will:  - take medications as prescribed check glucose TID using CGM, document, and provide at future appointments check blood pressure BID, document, and provide at future appointments  Follow Up Plan: Telephone follow up appointment with care management team member scheduled for: ~ 6 weeks as previously scheduled       Medication Assistance: None required.  Patient affirms current coverage meets needs.  Follow Up:  Patient agrees to Care Plan and Follow-up.  Plan: Telephone follow up appointment with care management team member scheduled for:  ~ 6 weeks  [name], PharmD, [place], [place] Clinical Pharmacist [employer] at [employer] [phone]

## 2020-08-16 ENCOUNTER — Other Ambulatory Visit (INDEPENDENT_AMBULATORY_CARE_PROVIDER_SITE_OTHER): Payer: Medicare Other

## 2020-08-16 ENCOUNTER — Other Ambulatory Visit: Payer: Self-pay

## 2020-08-16 DIAGNOSIS — E1121 Type 2 diabetes mellitus with diabetic nephropathy: Secondary | ICD-10-CM | POA: Diagnosis not present

## 2020-08-16 LAB — COMPREHENSIVE METABOLIC PANEL
ALT: 18 U/L (ref 0–35)
AST: 17 U/L (ref 0–37)
Albumin: 4 g/dL (ref 3.5–5.2)
Alkaline Phosphatase: 89 U/L (ref 39–117)
BUN: 20 mg/dL (ref 6–23)
CO2: 28 mEq/L (ref 19–32)
Calcium: 9.4 mg/dL (ref 8.4–10.5)
Chloride: 102 mEq/L (ref 96–112)
Creatinine, Ser: 0.98 mg/dL (ref 0.40–1.20)
GFR: 62.12 mL/min (ref >60.00)
Glucose, Bld: 121 mg/dL — ABNORMAL HIGH (ref 70–99)
Potassium: 5 mEq/L (ref 3.5–5.1)
Sodium: 138 mEq/L (ref 135–145)
Total Bilirubin: 0.4 mg/dL (ref 0.2–1.2)
Total Protein: 6.6 g/dL (ref 6.0–8.3)

## 2020-08-16 LAB — LIPID PANEL
Cholesterol: 104 mg/dL (ref 0–200)
HDL: 44.8 mg/dL (ref >39.00)
LDL Cholesterol: 43 mg/dL (ref 0–99)
NonHDL: 59.55
Total CHOL/HDL Ratio: 2
Triglycerides: 85 mg/dL (ref 0.0–149.0)
VLDL: 17 mg/dL (ref 0.0–40.0)

## 2020-08-16 LAB — HEMOGLOBIN A1C: Hgb A1c MFr Bld: 7.4 % — ABNORMAL HIGH (ref 4.6–6.5)

## 2020-08-19 NOTE — Progress Notes (Signed)
Your diabetes control is improving  and  Your a1c has improved to 7.4  Other labs are fine.   Please follow up in 3 months r  Regards,  Dr. Derrel Nip

## 2020-08-28 DIAGNOSIS — Z794 Long term (current) use of insulin: Secondary | ICD-10-CM | POA: Diagnosis not present

## 2020-08-28 DIAGNOSIS — E118 Type 2 diabetes mellitus with unspecified complications: Secondary | ICD-10-CM | POA: Diagnosis not present

## 2020-08-31 DIAGNOSIS — E113512 Type 2 diabetes mellitus with proliferative diabetic retinopathy with macular edema, left eye: Secondary | ICD-10-CM | POA: Diagnosis not present

## 2020-09-09 ENCOUNTER — Other Ambulatory Visit: Payer: Self-pay | Admitting: Internal Medicine

## 2020-09-09 DIAGNOSIS — E1165 Type 2 diabetes mellitus with hyperglycemia: Secondary | ICD-10-CM

## 2020-09-09 DIAGNOSIS — Z794 Long term (current) use of insulin: Secondary | ICD-10-CM

## 2020-09-17 DIAGNOSIS — B351 Tinea unguium: Secondary | ICD-10-CM | POA: Diagnosis not present

## 2020-09-17 DIAGNOSIS — E1140 Type 2 diabetes mellitus with diabetic neuropathy, unspecified: Secondary | ICD-10-CM | POA: Diagnosis not present

## 2020-09-17 DIAGNOSIS — Z794 Long term (current) use of insulin: Secondary | ICD-10-CM | POA: Diagnosis not present

## 2020-09-19 ENCOUNTER — Other Ambulatory Visit: Payer: Self-pay

## 2020-09-19 ENCOUNTER — Encounter: Payer: Self-pay | Admitting: Urology

## 2020-09-19 ENCOUNTER — Ambulatory Visit (INDEPENDENT_AMBULATORY_CARE_PROVIDER_SITE_OTHER): Payer: Medicare Other | Admitting: Urology

## 2020-09-19 VITALS — BP 150/69 | HR 80 | Ht 67.0 in | Wt 149.0 lb

## 2020-09-19 DIAGNOSIS — Z8551 Personal history of malignant neoplasm of bladder: Secondary | ICD-10-CM | POA: Diagnosis not present

## 2020-09-19 LAB — URINALYSIS, COMPLETE
Bilirubin, UA: NEGATIVE
Ketones, UA: NEGATIVE
Leukocytes,UA: NEGATIVE
Nitrite, UA: NEGATIVE
Protein,UA: NEGATIVE
RBC, UA: NEGATIVE
Specific Gravity, UA: 1.015 (ref 1.005–1.030)
Urobilinogen, Ur: 0.2 mg/dL (ref 0.2–1.0)
pH, UA: 5.5 (ref 5.0–7.5)

## 2020-09-19 LAB — MICROSCOPIC EXAMINATION
Bacteria, UA: NONE SEEN
WBC, UA: NONE SEEN /hpf (ref 0–5)

## 2020-09-19 NOTE — Progress Notes (Signed)
   09/19/20   CC:  Chief Complaint  Patient presents with  . Cysto    HPI: Laura Reed is a 60 y.o. female with with history of pT1N0 left upper tract urothelial carcinoma returns for a 6 month surveillance cystoscopy.  She is doing well today and has no urinary complaints.  Urinalysis is negative.  1- Left Upper Tract Transitional Cell Carcinoma/ Bladder cancer recurrence- s/p LEFT robotic nephroureterectomy on 10/31/2014 as well as combined ventral hernia repair with Dr. Bary Castilla. Post complicated by wound infection now well healed. Pathology pT1N0 with negative margins.  Recent Surveillance: 02/2015 - cysto NED; 05/2015 cysto NED, 08/2015- cysto NED, 11/2015- NED, CT Urogram 11/12/15 negative, poor quality of delayed phase.; 5/2-17- cysto with mild erythema following recent UTI; 02/2016 NED; 08/2016 Lg Ta TCC recurrence, 1 cm, multifocal. RTG neg.  08/2016- TURBT for bladder 1 cm recurrence near bladder neck and left UO, multifocal. R RTG negative. Mitomycin. LgTa. 10/2016- Induction BCG x 6 08/2017- Maintenance BCG x 3 02/2019- Maintenance BCG x 3 9/21- Cysto with erythema on posterior bladder wall, bx c/w follicular cystitis, R RTG negative  2 - Solitary Kidney - s/p left nephrectomyas per above. Most recent Cr 0.91 12/28/2019.  She does have an extensive smoking history, quit 5years ago but smoked up to 2 packs a day for 35 years. She also has multiple medical comorbidities including history of diabetes, CAD status post CABG, carotid endarterectomy.   Blood pressure (!) 150/69, pulse 80, height 5\' 7"  (1.702 m), weight 149 lb (67.6 kg). NED. A&Ox3.   No respiratory distress   Abd soft, NT, ND Normal external genitalia with patent urethral meatus  Cystoscopy Procedure Note  Patient identification was confirmed, informed consent was obtained, and patient was prepped using Betadine solution.  Lidocaine jelly was administered per urethral meatus.    Procedure: -  Flexible cystoscope introduced, without any difficulty.   - Thorough search of the bladder revealed:    normal urethral meatus    No discrete papillary tumor    normal urothelium with some vascular changes associated with recent biopsies consistent with scar    no stones    no ulcers     no tumors    no urethral polyps    no trabeculation  - Right UO normal. Left UO was surgically absent.   - Ureteral orifices were normal in position and appearance.  Post-Procedure: - Patient tolerated the procedure well  Assessment/ Plan:  1. Malignant neoplasm of urinary bladder, unspecified site (HCC)/ bladder leasion  Personal history of upper tract urothelial carcinoma as well as bladder cancer, last recurrence 2018.  No evidence of disease today  We discussed transition to annual cystoscopy but given her extensive smoking history and history of upper tract disease, shared decision making to continue on a every 6 month basis  Cysto in 6 months  Hollice Espy, MD

## 2020-09-25 ENCOUNTER — Telehealth: Payer: Self-pay

## 2020-09-25 ENCOUNTER — Ambulatory Visit (INDEPENDENT_AMBULATORY_CARE_PROVIDER_SITE_OTHER): Payer: Medicare Other | Admitting: Pharmacist

## 2020-09-25 DIAGNOSIS — I70213 Atherosclerosis of native arteries of extremities with intermittent claudication, bilateral legs: Secondary | ICD-10-CM

## 2020-09-25 DIAGNOSIS — E1169 Type 2 diabetes mellitus with other specified complication: Secondary | ICD-10-CM

## 2020-09-25 DIAGNOSIS — E1151 Type 2 diabetes mellitus with diabetic peripheral angiopathy without gangrene: Secondary | ICD-10-CM

## 2020-09-25 DIAGNOSIS — I10 Essential (primary) hypertension: Secondary | ICD-10-CM

## 2020-09-25 DIAGNOSIS — I739 Peripheral vascular disease, unspecified: Secondary | ICD-10-CM

## 2020-09-25 NOTE — Progress Notes (Signed)
Tried to reach out to schedule but no answer will continue to try

## 2020-09-25 NOTE — Chronic Care Management (AMB) (Signed)
Chronic Care Management Pharmacy Note  09/25/2020 Name:  Laura Reed MRN:  [id number] DOB:  1960-09-06  Subjective: Laura Reed is an 60 y.o. year old female who is a primary patient of Derrel Nip, [name], MD.  The CCM team was consulted for assistance with disease management and care coordination needs.    Engaged with patient by telephone for follow up visit in response to provider referral for pharmacy case management and/or care coordination services.   Consent to Services:  The patient was given information about Chronic Care Management services, agreed to services, and gave verbal consent prior to initiation of services.  Please see initial visit note for detailed documentation.   Patient Care Team: [name], MD as PCP - General (Internal Medicine) Bary Castilla Forest Gleason, MD (General Surgery) [name], MD (Internal Medicine) De Hollingshead, RPH-CPP as Pharmacist (Pharmacist)  Recent office visits: None since our last call  Recent consult visits:  3/14 - podiatry for PVD/DM foot care  3/16 - urology surveillance cystoscopy s/p urothelial carcinoma - normal, repeat in 6 months   Hospital visits: None in previous 6 months  Objective:  Lab Results  Component Value Date   CREATININE 0.98 08/16/2020   CREATININE 0.89 05/09/2020   CREATININE 0.91 12/28/2019    Lab Results  Component Value Date   HGBA1C 7.4 (H) 08/16/2020   Last diabetic Eye exam:  Lab Results  Component Value Date/Time   HMDIABEYEEXA Retinopathy (A) 07/11/2020 12:00 AM    Last diabetic Foot exam:  Lab Results  Component Value Date/Time   HMDIABFOOTEX abnormal 03/30/2014 12:00 AM        Component Value Date/Time   CHOL 104 08/16/2020 0933   TRIG 85.0 08/16/2020 0933   HDL 44.80 08/16/2020 0933   CHOLHDL 2 08/16/2020 0933   VLDL 17.0 08/16/2020 0933   LDLCALC 43 08/16/2020 0933   LDLDIRECT 47.0 01/28/2017 1509    Hepatic Function Latest Ref Rng & Units 08/16/2020  05/09/2020 12/28/2019  Total Protein 6.0 - 8.3 g/dL 6.6 6.6 7.4  Albumin 3.5 - 5.2 g/dL 4.0 4.0 4.3  AST 0 - 37 U/L $Remo'17 14 21  'PTzqs$ ALT 0 - 35 U/L $Remo'18 16 20  'pEAOA$ Alk Phosphatase 39 - 117 U/L 89 102 95  Total Bilirubin 0.2 - 1.2 mg/dL 0.4 0.5 0.4    Lab Results  Component Value Date/Time   TSH 0.96 05/18/2015 09:22 AM   TSH 1.03 03/17/2014 10:27 AM    CBC Latest Ref Rng & Units 03/15/2020 01/19/2019 01/18/2019  WBC 4.0 - 10.5 K/uL 9.2 8.9 11.4(H)  Hemoglobin 12.0 - 15.0 g/dL 13.6 12.2 12.4  Hematocrit 36.0 - 46.0 % 37.4 37.1 36.8  Platelets 150 - 400 K/uL 274 289 271    Clinical ASCVD: Yes - PAD   Social History   Tobacco Use  Smoking Status Former Smoker  . Packs/day: 2.00  . Years: 35.00  . Pack years: 70.00  . Types: Cigarettes  . Quit date: 03/29/2013  . Years since quitting: 7.4  Smokeless Tobacco Never Used   BP Readings from Last 3 Encounters:  09/19/20 (!) 150/69  08/08/20 (!) 136/58  07/04/20 138/60   Pulse Readings from Last 3 Encounters:  09/19/20 80  08/08/20 82  07/04/20 78   Wt Readings from Last 3 Encounters:  09/19/20 149 lb (67.6 kg)  08/08/20 155 lb (70.3 kg)  07/04/20 155 lb 12.8 oz (70.7 kg)    Assessment: Review of patient past medical  history, allergies, medications, health status, including review of consultants reports, laboratory and other test data, was performed as part of comprehensive evaluation and provision of chronic care management services.   SDOH:  (Social Determinants of Health) assessments and interventions performed:  SDOH Interventions   Flowsheet Row Most Recent Value  SDOH Interventions   Financial Strain Interventions Intervention Not Indicated  Stress Interventions Other (Comment)  [LCSW referral]      CCM Care Plan  No Known Allergies  Medications Reviewed Today    Reviewed by De Hollingshead, RPH-CPP (Pharmacist) on 09/25/20 at 1004  Med List Status: <None>  Medication Order Taking? Sig Documenting Provider Last Dose  Status Informant  acetaminophen (TYLENOL) 500 MG tablet [id number] Yes Take 1,000 mg by mouth every 6 (six) hours as needed for mild pain or headache.  [provider] Taking Active Self           Med Note [name] Sep 25, 2020 10:02 AM)    ALPRAZolam Duanne Moron) 0.5 MG tablet [id number] Yes TAKE 1 TABLET(0.5 MG) BY MOUTH DAILY AS NEEDED FOR ANXIETY [name], MD Taking Active   amLODipine (NORVASC) 5 MG tablet [id number] Yes TAKE 1 TABLET(5 MG) BY MOUTH DAILY [name], MD Taking Active   aspirin 81 MG chewable tablet [id number] Yes Chew 81 mg by mouth at bedtime.  [provider] Taking Active Self  atorvastatin (LIPITOR) 20 MG tablet [id number] Yes Take 1 tablet (20 mg total) by mouth daily. [name], MD Taking Active   Cholecalciferol (VITAMIN D3) 2000 UNITS TABS [id number] Yes Take 2,000 Units by mouth daily after supper.  [provider] Taking Active Self  clopidogrel (PLAVIX) 75 MG tablet [id number] Yes TAKE 1 TABLET(75 MG) BY MOUTH DAILY [name], MD Taking Active   Continuous Blood Gluc Sensor (FREESTYLE LIBRE 2 SENSOR) [place] [id number] Yes Use to check sugar at least 4 times daily [name], MD Taking Active   empagliflozin (JARDIANCE) 10 MG TABS tablet [id number] Yes Take 1 tablet (10 mg total) by mouth daily before breakfast. [name], MD Taking Active   gabapentin (NEURONTIN) 800 MG tablet [id number] Yes Take 1 tablet (800 mg total) by mouth 3 (three) times daily. [name], MD Taking Active   insulin degludec (TRESIBA FLEXTOUCH) 100 UNIT/ML FlexTouch Pen [id number] Yes Inject 22 Units into the skin daily. [name], MD Taking Active   insulin lispro (HUMALOG) 100 UNIT/ML KwikPen [id number] No INJECT 10 UNITS UNDER THE SKIN THREE TIMES DAILY BEFORE MEALS  Patient not taking: Reported on 09/25/2020   [provider] Not Taking Active   Insulin Pen Needle (PEN NEEDLES) 32G X 4 MM MISC [id number] No Use  to take insulin daily  Patient not taking: Reported on 09/25/2020   [name], MD Not Taking Active   Lancets Misc. [place] [id number] Yes Use to check blood sugars 4 times daily [name], MD Taking Active   losartan (COZAAR) 50 MG tablet [id number] Yes TAKE 1 TABLET(50 MG) BY MOUTH DAILY [name], MD Taking Active   Multiple Vitamin (MULTIVITAMIN) tablet [id number] Yes Take 1 tablet by mouth daily. [provider] Taking Active   OZEMPIC, 1 MG/DOSE, 4 MG/3ML SOPN [id number] Yes INJECT 1 MG UNDER THE SKIN ONCE WEEKLY [name], MD Taking Active           Patient Active Problem List   Diagnosis Date Noted  . Atherosclerosis of  artery of extremity with ulceration ([place]) 07/06/2020  . Anxiety state 04/01/2017  . Atherosclerosis of native arteries of extremity with intermittent claudication ([place]) 01/18/2017  . Carotid stenosis 12/04/2016  . Insomnia secondary to anxiety 02/21/2016  . Thyroid nodule 02/21/2016  . Hospital discharge follow-up 11/11/2015  . Encounter for preventive health examination 08/02/2015  . S/p nephrectomy 05/15/2015  . Diabetic nephropathy with proteinuria ([place]) 01/27/2015  . Overweight (BMI 25.0-29.9) 10/07/2014  . History of renal cell carcinoma 09/28/2014  . CAD (coronary artery disease) 08/24/2014  . Hernia of abdominal cavity 08/10/2014  . Hyperlipidemia associated with type 2 diabetes mellitus ([place]) 06/17/2014  . S/P carotid endarterectomy 01/22/2014  . History of shingles 05/18/2013  . Preoperative evaluation of a medical condition to rule out surgical contraindications (TAR required) 04/06/2013  . Diabetic peripheral neuropathy associated with type 2 diabetes mellitus ([place]) 04/06/2013  . Chronic hip pain 01/16/2013  . Routine general medical examination at a health care facility 06/26/2012  . PVD (peripheral vascular disease) ([place])   . History of tobacco abuse 04/29/2011  . Controlled type 2 DM with peripheral circulatory  disorder ([place])   . Hypertension   . Hemorrhoid     Immunization History  Administered Date(s) Administered  . Influenza Split 03/16/2012  . Influenza,inj,Quad PF,6+ Mos 04/06/2013, 03/14/2014, 03/27/2017, 03/25/2018, 05/11/2019, 03/30/2020  . Influenza-Unspecified 04/02/2015, 03/14/2016  . Moderna Sars-Covid-2 Vaccination 09/28/2019, 10/26/2019, 05/29/2020  . Pneumococcal Conjugate-13 06/15/2014  . Pneumococcal Polysaccharide-23 07/07/2010  . Tdap 03/16/2012    Conditions to be addressed/monitored: CAD, HTN, HLD and DMII  Care Plan : Medication Management  Updates made by De Hollingshead, RPH-CPP since 09/25/2020 12:00 AM    Problem: Diabetes, HTN, HLD, CKD, CAD     Long-Range Goal: Disease Progression Prevention   This Visit's Progress: On track  Recent Progress: On track  Priority: High  Note:   Current Barriers:  . Unable to achieve control of diabetes  . Suboptimal therapeutic regimen for diabetes  Pharmacist Clinical Goal(s):  Marland Kitchen Over the next 90 days, patient will achieve control of diabetes as evidenced by A1c  through collaboration with PharmD and provider.   Interventions: . 1:1 collaboration with [name], MD regarding development and update of comprehensive plan of care as evidenced by provider attestation and co-signature . Inter-disciplinary care team collaboration (see longitudinal plan of care) . Comprehensive medication review performed; medication list updated in electronic medical record  SDOH: . Stress related to her son's relationship. Discussed LCSW referral. She is interested.   Health Maintenance: . Up to date on COVID vaccinations . Up to date on eye exam, foot exam  Diabetes: . Uncontrolled; current treatment: Ozempic 1 mg weekly, Tresiba 22 units daily, Jardiance 10 mg daily  o Resistant to metformin d/t things she has heard about lawsuits.  . Current glucose readings: using Libre 2 Date of Download: 3/6-3/19/2022 % Time CGM is  active: 96% Average Glucose: 143 mg/dL Glucose Management Indicator: 6.7% Glucose Variability: 21.5 (goal <36%) Time in Goal:  - Time in range 70-180: 90% - Time above range: 10% - Time below range: 0% Observed patterns: occasional post prandial elevations, but no consistent patterns.  . Reports that she feels she overeats when she is stressed.  . Reviewed glucose download. Continue current regimen at this time time. Discussed impact of stress eating. Referral to LCSW to help manage.  . Continue current regimen at this time.   Hypertension: . Controlled per last office reading; current treatment: amlodipine 5 mg  daily, losartan 50 mg daily  . Current home readings: has not been checking at home lately . Recommend to continue current regimen at this time. Discussed importance of periodic blood pressure monitoring. She notes she will try to remember to do so ~ 2 times weekly  Hyperlipidemia and secondary ASCVD risk reduction (severe PVD) . Controlled per last lipid panel ; current treatment: atorvastatin 20 mg daily  . Antiplatelet regimen: aspirin 81 mg daily and clopidogrel 75 mg daily  . Recommended to continue current regimen at this time  Anxiety/Insomnia: . Uncontrolled; reports increased stress related to her son's relationship. Current regimen: alprazolam 0.5 mg PRN, though using QPM to help with sleep . Counseled on long term risks of benzodiazepines. Continue to monitor need.  . Will discuss melatonin + good sleep hygiene principals moving forward.  Marland Kitchen LCSW referral as above  Peripheral Neuropathy: . Moderately well controlled; current regimen: gabapentin 800 mg TID . Denies increased benefit w/ gabapentin dose increase, but overall satisfied with degree of pain relief. Denies increase in sedation with increased dose of gabapentin . Recommended to continue current regimen as above.   Supplements: Vitamin D daily, multivitamin daily  Patient Goals/Self-Care  Activities . Over the next 90 days, patient will:  - take medications as prescribed check glucose three times daily using CGM, document, and provide at future appointments check blood pressure periodically, document, and provide at future appointments  Follow Up Plan: Telephone follow up appointment with care management team member scheduled for: ~ 12 weeks       Medication Assistance: None required.  Patient affirms current coverage meets needs.  Patient's preferred pharmacy is:  [id number] [address], [place] [place] [address] Phone: [phone] Fax: [phone]  Uses pill box? Yes Pt endorses 100% compliance  Follow Up:  Patient agrees to Care Plan and Follow-up.  Plan: Telephone follow up appointment with care management team member scheduled for:  ~ 12 weeks  [name], PharmD, [place], [place] Clinical Pharmacist [employer] at [employer] [phone]

## 2020-09-25 NOTE — Chronic Care Management (AMB) (Signed)
  Chronic Care Management   Note  09/25/2020 Name: VERNA DESROCHER MRN: [id number] DOB: 14-Nov-1960  BIRD TAILOR is a 60 y.o. year old female who is a primary care patient of Derrel Nip, [name], MD. WALLACE COGLIANO is currently enrolled in care management services. An additional referral for LCSW was placed.   Follow up plan: Unsuccessful telephone outreach attempt made. A HIPAA compliant phone message was left for the patient providing contact information and requesting a return call.  The care management team will reach out to the patient again over the next 2 days.  If patient returns call to provider office, please advise to call [facility]  at [place], [place], [place], [place] [address] Direct Dial: [phone] [email or web address]: [email or web address]

## 2020-09-25 NOTE — Patient Instructions (Signed)
Visit Information  PATIENT GOALS: Goals Addressed              This Visit's Progress     Patient Stated   .  Medication Monitoring (pt-stated)        Patient Goals/Self-Care Activities . Over the next 90 days, patient will:  - take medications as prescribed check glucose three times daily using CGM, document, and provide at future appointments check blood pressure periodically, document, and provide at future appointments       Patient verbalizes understanding of instructions provided today and agrees to view in [place].   Plan: Telephone follow up appointment with care management team member scheduled for:  ~ 12 weeks  [name], PharmD, [place], [place] Clinical Pharmacist [employer] at [employer] [phone]

## 2020-09-27 ENCOUNTER — Ambulatory Visit: Payer: Medicare Other | Admitting: *Deleted

## 2020-09-27 ENCOUNTER — Encounter: Payer: Self-pay | Admitting: *Deleted

## 2020-09-27 DIAGNOSIS — E1169 Type 2 diabetes mellitus with other specified complication: Secondary | ICD-10-CM

## 2020-09-27 DIAGNOSIS — E785 Hyperlipidemia, unspecified: Secondary | ICD-10-CM | POA: Diagnosis not present

## 2020-09-27 DIAGNOSIS — I10 Essential (primary) hypertension: Secondary | ICD-10-CM

## 2020-09-27 DIAGNOSIS — E1151 Type 2 diabetes mellitus with diabetic peripheral angiopathy without gangrene: Secondary | ICD-10-CM

## 2020-09-27 DIAGNOSIS — I739 Peripheral vascular disease, unspecified: Secondary | ICD-10-CM

## 2020-09-27 DIAGNOSIS — F411 Generalized anxiety disorder: Secondary | ICD-10-CM

## 2020-09-27 NOTE — Chronic Care Management (AMB) (Signed)
Chronic Care Management    Clinical Social Work Note  09/27/2020 Name: Laura Reed MRN: [id number] DOB: 1960/12/15  Laura Reed is a 60 y.o. year old female who is a primary care patient of Derrel Nip, [name], MD. The CCM team was consulted to assist the patient with chronic disease management and/or care coordination needs related to: [place] and Resources for Anxiety and Family and Relationship Dysfunction.   Engaged with patient by telephone for initial visit in response to provider referral for social work chronic care management and care coordination services.   Consent to Services:  The patient was given information about Chronic Care Management services, agreed to services, and gave verbal consent prior to initiation of services.  Please see initial visit note for detailed documentation.   Patient agreed to services and consent obtained.   Assessment: Review of patient past medical history, allergies, medications, and health status, including review of relevant consultants reports was performed today as part of a comprehensive evaluation and provision of chronic care management and care coordination services.     SDOH (Social Determinants of Health) assessments and interventions performed:  SDOH Interventions   Flowsheet Row Most Recent Value  SDOH Interventions   Food Insecurity Interventions Intervention Not Indicated  Financial Strain Interventions Intervention Not Indicated  Housing Interventions Intervention Not Indicated  Intimate Partner Violence Interventions Intervention Not Indicated  Physical Activity Interventions Patient Refused  Stress Interventions Offered [employer] Resources, Provide Counseling  Social Connections Interventions Intervention Not Indicated  Transportation Interventions Intervention Not Indicated  Alcohol Brief Interventions/Follow-up AUDIT Score <7 follow-up not indicated       Advanced Directives Status: Not ready  or willing to discuss.  CCM Care Plan  No Known Allergies  Outpatient Encounter Medications as of 09/27/2020  Medication Sig  . acetaminophen (TYLENOL) 500 MG tablet Take 1,000 mg by mouth every 6 (six) hours as needed for mild pain or headache.   . ALPRAZolam (XANAX) 0.5 MG tablet TAKE 1 TABLET(0.5 MG) BY MOUTH DAILY AS NEEDED FOR ANXIETY  . amLODipine (NORVASC) 5 MG tablet TAKE 1 TABLET(5 MG) BY MOUTH DAILY  . aspirin 81 MG chewable tablet Chew 81 mg by mouth at bedtime.   Marland Kitchen atorvastatin (LIPITOR) 20 MG tablet Take 1 tablet (20 mg total) by mouth daily.  . Cholecalciferol (VITAMIN D3) 2000 UNITS TABS Take 2,000 Units by mouth daily after supper.   . clopidogrel (PLAVIX) 75 MG tablet TAKE 1 TABLET(75 MG) BY MOUTH DAILY  . Continuous Blood Gluc Sensor (FREESTYLE LIBRE 2 SENSOR) MISC Use to check sugar at least 4 times daily  . empagliflozin (JARDIANCE) 10 MG TABS tablet Take 1 tablet (10 mg total) by mouth daily before breakfast.  . gabapentin (NEURONTIN) 800 MG tablet Take 1 tablet (800 mg total) by mouth 3 (three) times daily.  . insulin degludec (TRESIBA FLEXTOUCH) 100 UNIT/ML FlexTouch Pen Inject 22 Units into the skin daily.  . insulin lispro (HUMALOG) 100 UNIT/ML KwikPen INJECT 10 UNITS UNDER THE SKIN THREE TIMES DAILY BEFORE MEALS (Patient not taking: Reported on 09/25/2020)  . Insulin Pen Needle (PEN NEEDLES) 32G X 4 MM MISC Use to take insulin daily (Patient not taking: Reported on 09/25/2020)  . Lancets Misc. MISC Use to check blood sugars 4 times daily  . losartan (COZAAR) 50 MG tablet TAKE 1 TABLET(50 MG) BY MOUTH DAILY  . Multiple Vitamin (MULTIVITAMIN) tablet Take 1 tablet by mouth daily.  Marland Kitchen OZEMPIC, 1 MG/DOSE, 4 MG/3ML SOPN INJECT 1  MG UNDER THE SKIN ONCE WEEKLY   No facility-administered encounter medications on file as of 09/27/2020.    Patient Active Problem List   Diagnosis Date Noted  . Atherosclerosis of artery of extremity with ulceration ([place]) 07/06/2020  . Anxiety  state 04/01/2017  . Atherosclerosis of native arteries of extremity with intermittent claudication ([place]) 01/18/2017  . Carotid stenosis 12/04/2016  . Insomnia secondary to anxiety 02/21/2016  . Thyroid nodule 02/21/2016  . Hospital discharge follow-up 11/11/2015  . Encounter for preventive health examination 08/02/2015  . S/p nephrectomy 05/15/2015  . Diabetic nephropathy with proteinuria ([place]) 01/27/2015  . Overweight (BMI 25.0-29.9) 10/07/2014  . History of renal cell carcinoma 09/28/2014  . CAD (coronary artery disease) 08/24/2014  . Hernia of abdominal cavity 08/10/2014  . Hyperlipidemia associated with type 2 diabetes mellitus ([place]) 06/17/2014  . S/P carotid endarterectomy 01/22/2014  . History of shingles 05/18/2013  . Preoperative evaluation of a medical condition to rule out surgical contraindications (TAR required) 04/06/2013  . Diabetic peripheral neuropathy associated with type 2 diabetes mellitus ([place]) 04/06/2013  . Chronic hip pain 01/16/2013  . Routine general medical examination at a health care facility 06/26/2012  . PVD (peripheral vascular disease) ([place])   . History of tobacco abuse 04/29/2011  . Controlled type 2 DM with peripheral circulatory disorder ([place])   . Hypertension   . Hemorrhoid     Conditions to be addressed/monitored: Anxiety, Hypertension, Insomnia Secondary to Anxiety.  Mental Health Concerns and Family and Relationship Dysfunction.  Care Plan : LCSW Plan of Care  Updates made by Francis Gaines, LCSW since 09/27/2020 12:00 AM    Problem: Reduce and Manage My Symptoms of Anxiety.   Priority: High    Long-Range Goal: Reduce and Manage My Symptoms of Anxiety.   Start Date: 09/27/2020  Expected End Date: 12/13/2020  This Visit's Progress: On track  Priority: High  Note:   Current Barriers:  . Unable to independently manage symptoms of Anxiety. . Knowledge deficits related to accessing mental health provider in patient with Anxiety.  . Patient  is experiencing symptoms of Anxiety, which seems to be exacerbated by her son's recent engagement to a woman he met online that he barely knows.     . Patient needs Support, Education, and Care Coordination in order to meet unmet mental health needs. . Patient is unable to independently navigate community resource options without care coordination support. . Limited social support and family/relationship dysfunction. Clinical Social Work [employer]):  Marland Kitchen Over the next 90 days, patient will work with LCSW weekly by telephone to reduce and manage symptoms of Anxiety until connected for ongoing counseling resources, or until symptoms of Anxiety are reduced and better managed. . Patient will implement clinical interventions discussed today to decrease symptoms of Anxiety and increase knowledge and/or ability of: coping skills, healthy habits, self-management skills, and stress reduction. . Over the next 90 days, patient will demonstrate improved adherence to prescribed treatment plan for Anxiety, as evidenced by a reduction in symptoms.  Interventions:  . Collaboration with [name], MD regarding development and update of comprehensive plan of care as evidenced by provider attestation and co-signature. . Assessed patient's understanding, education, previous treatment and care coordination needs.  . Patient interviewed and appropriate assessments performed: Review of PHQ 2 and PHQ 9 Depression Screening scores. . Provided basic mental health support, education and interventions.  Nash Dimmer with appropriate clinical care team members regarding patient needs. . Discussed several options for long-term counseling based  on need and insurance.  . Reviewed mental health medications with patient prescribed by PCP and discussed compliance.  . Other interventions include: Motivational Interviewing, Solution-Focused Strategies, Mindfulness Meditation, Deep Breathing Exercises, Relaxation Techniques, Brief  Cognitive Behavioral Therapy, Emotional/Supportive Counseling, Psychotropic Medication Adherence Assessment, Sleep Hygiene, Problem Solving and Teaching/Coaching Strategies.   Self-Care Activities: . Self administers medications as prescribed. . Attends all scheduled provider appointments. . Calls pharmacy for medication refills. . Performs ADL's/IADL's independently. . Calls provider office for new concerns or questions. . Lives independently and has good, strong family support. . Motivated for treatment. Patient Goals: . Implement clinical interventions discussed today. . Review EMMI Educational Material pertaining to EMMI - Generalized Anxiety Disorder; Tips to Help You Cope in Uncertain Times; Stress; Tips To Help You Worry Less. . Review and pratice proper use of Deep Breathing Exercises, Relaxation Techniques and Mindfulness Mediation, daily. . Review and educate self on 12 Common Symptoms of Depression that Shouldn't Be Ignored and Stress Management - Breathing Exercises for Relaxation. . Begin personal counseling with LCSW weekly, beginning on 09/27/2020. Marland Kitchen Check out volunteer opportunities at [employer]. . Talk about feelings with friends, family members and/or neighbors. . Practice positive thinking and self-talk.  Follow Up Plan: Telephone follow up appointment with care management team member scheduled for: 10/08/2020 at 11:00am.      Follow Up Plan: Telephone follow up appointment with care management team member scheduled for:  10/08/2020 at 11:00am.      Nat Christen LCSW Licensed Clinical Social Worker [place]  [phone]

## 2020-09-27 NOTE — Patient Instructions (Signed)
Visit Information  PATIENT GOALS:  Goals Addressed              This Visit's Progress   .  Reduce and Manage My Symptoms of Anxiety. (pt-stated)   On track     Timeframe:  Long-Range Goal Priority:  High Start Date:   09/27/2020                         Expected End Date:  12/13/2020                      Follow Up Date:  10/08/2020 at 11:00am  Patient Goals: . Implement clinical interventions discussed today. . Review EMMI Educational Material pertaining to EMMI - Generalized Anxiety Disorder; Tips to Help You Cope in Uncertain Times; Stress; Tips To Help You Worry Less. . Review and pratice proper use of Deep Breathing Exercises, Relaxation Techniques and Mindfulness Mediation, daily. . Review and educate self on 12 Common Symptoms of Depression that Shouldn't Be Ignored and Stress Management - Breathing Exercises for Relaxation. . Begin personal counseling with LCSW weekly, beginning on 09/27/2020. Marland Kitchen Check out volunteer opportunities at [employer]. . Talk about feelings with friends, family members and/or neighbors. . Practice positive thinking and self-talk.    Why is this important?    When you are stressed, down or upset, your body reacts too.   For example, your blood pressure may get higher; you may have a headache or stomachache.   When your emotions get the best of you, your body's ability to fight off cold and flu gets weak.   These steps will help you manage your emotions.           Consent to CCM Services: Laura Reed was given information about Chronic Care Management services today including:  1. CCM service includes personalized support from designated clinical staff supervised by her physician, including individualized plan of care and coordination with other care providers 2. 24/7 contact phone numbers for assistance for urgent and routine care needs. 3. Service will only be billed when office clinical staff spend 20 minutes or more in a month to coordinate  care. 4. Only one practitioner may furnish and bill the service in a calendar month. 5. The patient may stop CCM services at any time (effective at the end of the month) by phone call to the office staff. 6. The patient will be responsible for cost sharing (co-pay) of up to 20% of the service fee (after annual deductible is met).  Patient agreed to services and verbal consent obtained.   CLINICAL CARE PLAN: Patient Care Plan: Medication Management    Problem Identified: Diabetes, HTN, HLD, CKD, CAD     Long-Range Goal: Disease Progression Prevention   This Visit's Progress: On track  Recent Progress: On track  Priority: High  Note:   Current Barriers:  . Unable to achieve control of diabetes  . Suboptimal therapeutic regimen for diabetes  Pharmacist Clinical Goal(s):  Marland Kitchen Over the next 90 days, patient will achieve control of diabetes as evidenced by A1c  through collaboration with PharmD and provider.   Interventions: . 1:1 collaboration with [name], MD regarding development and update of comprehensive plan of care as evidenced by provider attestation and co-signature . Inter-disciplinary care team collaboration (see longitudinal plan of care) . Comprehensive medication review performed; medication list updated in electronic medical record  SDOH: . Stress related to her son's relationship. Discussed  LCSW referral. She is interested.   Health Maintenance: . Up to date on COVID vaccinations . Up to date on eye exam, foot exam  Diabetes: . Uncontrolled; current treatment: Ozempic 1 mg weekly, Tresiba 22 units daily, Jardiance 10 mg daily  o Resistant to metformin d/t things she has heard about lawsuits.  . Current glucose readings: using Libre 2 Date of Download: 3/6-3/19/2022 % Time CGM is active: 96% Average Glucose: 143 mg/dL Glucose Management Indicator: 6.7% Glucose Variability: 21.5 (goal <36%) Time in Goal:  - Time in range 70-180: 90% - Time above range:  10% - Time below range: 0% Observed patterns: occasional post prandial elevations, but no consistent patterns.  . Reports that she feels she overeats when she is stressed.  . Reviewed glucose download. Continue current regimen at this time time. Discussed impact of stress eating. Referral to LCSW to help manage.  . Continue current regimen at this time.   Hypertension: . Controlled per last office reading; current treatment: amlodipine 5 mg daily, losartan 50 mg daily  . Current home readings: has not been checking at home lately . Recommend to continue current regimen at this time. Discussed importance of periodic blood pressure monitoring. She notes she will try to remember to do so ~ 2 times weekly  Hyperlipidemia and secondary ASCVD risk reduction (severe PVD) . Controlled per last lipid panel ; current treatment: atorvastatin 20 mg daily  . Antiplatelet regimen: aspirin 81 mg daily and clopidogrel 75 mg daily  . Recommended to continue current regimen at this time  Anxiety/Insomnia: . Uncontrolled; reports increased stress related to her son's relationship. Current regimen: alprazolam 0.5 mg PRN, though using QPM to help with sleep . Counseled on long term risks of benzodiazepines. Continue to monitor need.  . Will discuss melatonin + good sleep hygiene principals moving forward.  Marland Kitchen LCSW referral as above  Peripheral Neuropathy: . Moderately well controlled; current regimen: gabapentin 800 mg TID . Denies increased benefit w/ gabapentin dose increase, but overall satisfied with degree of pain relief. Denies increase in sedation with increased dose of gabapentin . Recommended to continue current regimen as above.   Supplements: Vitamin D daily, multivitamin daily  Patient Goals/Self-Care Activities . Over the next 90 days, patient will:  - take medications as prescribed check glucose three times daily using CGM, document, and provide at future appointments check blood pressure  periodically, document, and provide at future appointments  Follow Up Plan: Telephone follow up appointment with care management team member scheduled for: ~ 12 weeks     Patient Care Plan: LCSW Plan of Care    Problem Identified: Reduce and Manage My Symptoms of Anxiety.   Priority: High    Long-Range Goal: Reduce and Manage My Symptoms of Anxiety.   Start Date: 09/27/2020  Expected End Date: 12/13/2020  This Visit's Progress: On track  Priority: High  Note:   Current Barriers:  . Unable to independently manage symptoms of Anxiety. . Knowledge deficits related to accessing mental health provider in patient with Anxiety.  . Patient is experiencing symptoms of Anxiety, which seems to be exacerbated by her son's recent engagement to a woman he met online that he barely knows.     . Patient needs Support, Education, and Care Coordination in order to meet unmet mental health needs. . Patient is unable to independently navigate community resource options without care coordination support. . Limited social support and family/relationship dysfunction. Clinical Social Work [employer]):  Marland Kitchen Over the next 90  days, patient will work with LCSW weekly by telephone to reduce and manage symptoms of Anxiety until connected for ongoing counseling resources, or until symptoms of Anxiety are reduced and better managed. . Patient will implement clinical interventions discussed today to decrease symptoms of Anxiety and increase knowledge and/or ability of: coping skills, healthy habits, self-management skills, and stress reduction. . Over the next 90 days, patient will demonstrate improved adherence to prescribed treatment plan for Anxiety, as evidenced by a reduction in symptoms.  Interventions:  . Collaboration with [name], MD regarding development and update of comprehensive plan of care as evidenced by provider attestation and co-signature. . Assessed patient's understanding, education, previous  treatment and care coordination needs.  . Patient interviewed and appropriate assessments performed: Review of PHQ 2 and PHQ 9 Depression Screening scores. . Provided basic mental health support, education and interventions.  Nash Dimmer with appropriate clinical care team members regarding patient needs. . Discussed several options for long-term counseling based on need and insurance.  . Reviewed mental health medications with patient prescribed by PCP and discussed compliance.  . Other interventions include: Motivational Interviewing, Solution-Focused Strategies, Mindfulness Meditation, Deep Breathing Exercises, Relaxation Techniques, Brief Cognitive Behavioral Therapy, Emotional/Supportive Counseling, Psychotropic Medication Adherence Assessment, Sleep Hygiene, Problem Solving and Teaching/Coaching Strategies.   Self-Care Activities: . Self administers medications as prescribed. . Attends all scheduled provider appointments. . Calls pharmacy for medication refills. . Performs ADL's/IADL's independently. . Calls provider office for new concerns or questions. . Lives independently and has good, strong family support. . Motivated for treatment. Patient Goals: . Implement clinical interventions discussed today. . Review EMMI Educational Material pertaining to EMMI - Generalized Anxiety Disorder; Tips to Help You Cope in Uncertain Times; Stress; Tips To Help You Worry Less. . Review and pratice proper use of Deep Breathing Exercises, Relaxation Techniques and Mindfulness Mediation, daily. . Review and educate self on 12 Common Symptoms of Depression that Shouldn't Be Ignored and Stress Management - Breathing Exercises for Relaxation. . Begin personal counseling with LCSW weekly, beginning on 09/27/2020. Marland Kitchen Check out volunteer opportunities at [employer]. . Talk about feelings with friends, family members and/or neighbors. . Practice positive thinking and self-talk.  Follow Up Plan: Telephone  follow up appointment with care management team member scheduled for: 10/08/2020 at 11:00am.

## 2020-09-28 ENCOUNTER — Telehealth: Payer: Medicare Other

## 2020-09-28 DIAGNOSIS — E118 Type 2 diabetes mellitus with unspecified complications: Secondary | ICD-10-CM | POA: Diagnosis not present

## 2020-09-28 DIAGNOSIS — Z794 Long term (current) use of insulin: Secondary | ICD-10-CM | POA: Diagnosis not present

## 2020-09-28 NOTE — Progress Notes (Signed)
Patient has been seen by LCSW

## 2020-10-08 ENCOUNTER — Ambulatory Visit (INDEPENDENT_AMBULATORY_CARE_PROVIDER_SITE_OTHER): Payer: Medicare Other | Admitting: *Deleted

## 2020-10-08 DIAGNOSIS — E1165 Type 2 diabetes mellitus with hyperglycemia: Secondary | ICD-10-CM

## 2020-10-08 DIAGNOSIS — E1151 Type 2 diabetes mellitus with diabetic peripheral angiopathy without gangrene: Secondary | ICD-10-CM

## 2020-10-08 DIAGNOSIS — G894 Chronic pain syndrome: Secondary | ICD-10-CM

## 2020-10-08 DIAGNOSIS — Z794 Long term (current) use of insulin: Secondary | ICD-10-CM | POA: Diagnosis not present

## 2020-10-08 DIAGNOSIS — F411 Generalized anxiety disorder: Secondary | ICD-10-CM

## 2020-10-08 NOTE — Chronic Care Management (AMB) (Signed)
Chronic Care Management    Clinical Social Work Note  10/08/2020 Name: Laura Reed MRN: [id number] DOB: June 16, 1961  Laura Reed is a 60 y.o. year old female who is a primary care patient of [name], MD. The CCM team was consulted to assist the patient with chronic disease management and/or care coordination needs related to: [place] and Resources in regards to Anxiety, Insomnia Secondary to Anxiety, Chronic Pain and Controlled Type II Diabetes Mellitus w/ Peripheral Circulatory Disorder.   Engaged with patient by telephone for follow up visit in response to provider referral for social work chronic care management and care coordination services.   Consent to Services:  The patient was given information about Chronic Care Management services, agreed to services, and gave verbal consent prior to initiation of services.  Please see initial visit note for detailed documentation.   Patient agreed to services and consent obtained.   Assessment: Review of patient past medical history, allergies, medications, and health status, including review of relevant consultants reports was performed today as part of a comprehensive evaluation and provision of chronic care management and care coordination services.     SDOH (Social Determinants of Health) assessments and interventions performed:    Advanced Directives Status: Not addressed in this encounter.  CCM Care Plan  No Known Allergies  Outpatient Encounter Medications as of 10/08/2020  Medication Sig  . acetaminophen (TYLENOL) 500 MG tablet Take 1,000 mg by mouth every 6 (six) hours as needed for mild pain or headache.   . ALPRAZolam (XANAX) 0.5 MG tablet TAKE 1 TABLET(0.5 MG) BY MOUTH DAILY AS NEEDED FOR ANXIETY  . amLODipine (NORVASC) 5 MG tablet TAKE 1 TABLET(5 MG) BY MOUTH DAILY  . aspirin 81 MG chewable tablet Chew 81 mg by mouth at bedtime.   Marland Kitchen atorvastatin (LIPITOR) 20 MG tablet Take 1 tablet (20 mg total) by  mouth daily.  . Cholecalciferol (VITAMIN D3) 2000 UNITS TABS Take 2,000 Units by mouth daily after supper.   . clopidogrel (PLAVIX) 75 MG tablet TAKE 1 TABLET(75 MG) BY MOUTH DAILY  . Continuous Blood Gluc Sensor (FREESTYLE LIBRE 2 SENSOR) MISC Use to check sugar at least 4 times daily  . empagliflozin (JARDIANCE) 10 MG TABS tablet Take 1 tablet (10 mg total) by mouth daily before breakfast.  . gabapentin (NEURONTIN) 800 MG tablet Take 1 tablet (800 mg total) by mouth 3 (three) times daily.  . insulin degludec (TRESIBA FLEXTOUCH) 100 UNIT/ML FlexTouch Pen Inject 22 Units into the skin daily.  . insulin lispro (HUMALOG) 100 UNIT/ML KwikPen INJECT 10 UNITS UNDER THE SKIN THREE TIMES DAILY BEFORE MEALS (Patient not taking: Reported on 09/25/2020)  . Insulin Pen Needle (PEN NEEDLES) 32G X 4 MM MISC Use to take insulin daily (Patient not taking: Reported on 09/25/2020)  . Lancets Misc. MISC Use to check blood sugars 4 times daily  . losartan (COZAAR) 50 MG tablet TAKE 1 TABLET(50 MG) BY MOUTH DAILY  . Multiple Vitamin (MULTIVITAMIN) tablet Take 1 tablet by mouth daily.  Marland Kitchen OZEMPIC, 1 MG/DOSE, 4 MG/3ML SOPN INJECT 1 MG UNDER THE SKIN ONCE WEEKLY   No facility-administered encounter medications on file as of 10/08/2020.    Patient Active Problem List   Diagnosis Date Noted  . Atherosclerosis of artery of extremity with ulceration ([place]) 07/06/2020  . Anxiety state 04/01/2017  . Atherosclerosis of native arteries of extremity with intermittent claudication ([place]) 01/18/2017  . Carotid stenosis 12/04/2016  . Insomnia secondary to anxiety 02/21/2016  .  Thyroid nodule 02/21/2016  . Hospital discharge follow-up 11/11/2015  . Encounter for preventive health examination 08/02/2015  . S/p nephrectomy 05/15/2015  . Diabetic nephropathy with proteinuria ([place]) 01/27/2015  . Overweight (BMI 25.0-29.9) 10/07/2014  . History of renal cell carcinoma 09/28/2014  . CAD (coronary artery disease) 08/24/2014  . Hernia  of abdominal cavity 08/10/2014  . Hyperlipidemia associated with type 2 diabetes mellitus ([place]) 06/17/2014  . S/P carotid endarterectomy 01/22/2014  . History of shingles 05/18/2013  . Preoperative evaluation of a medical condition to rule out surgical contraindications (TAR required) 04/06/2013  . Diabetic peripheral neuropathy associated with type 2 diabetes mellitus ([place]) 04/06/2013  . Chronic hip pain 01/16/2013  . Routine general medical examination at a health care facility 06/26/2012  . PVD (peripheral vascular disease) ([place])   . History of tobacco abuse 04/29/2011  . Controlled type 2 DM with peripheral circulatory disorder ([place])   . Hypertension   . Hemorrhoid     Conditions to be addressed/monitored: Anxiety, Insomnia Secondary to Anxiety, Chronic Pain and Controlled Type II Diabetes Mellitus w/ Peripheral Circulatory Disorder. Financial Constraints Related to Son Requiring [employer], Limited Social Support, Mental Health Concerns, Family and Relationship Dysfunction, Social Isolation and Limited Access to Caregiver.  Care Plan : LCSW Plan of Care  Updates made by Francis Gaines, LCSW since 10/08/2020 12:00 AM    Problem: Reduce and Manage My Symptoms of Anxiety.   Priority: High    Long-Range Goal: Reduce and Manage My Symptoms of Anxiety.   Start Date: 09/27/2020  Expected End Date: 12/13/2020  This Visit's Progress: On track  Recent Progress: On track  Priority: High  Note:   Current Barriers:  . Unable to independently manage symptoms of Anxiety. . Knowledge deficits related to accessing mental health provider in patient with Anxiety.  . Patient is experiencing symptoms of Anxiety, which seems to be exacerbated by her son's recent engagement to a woman he met online that he barely knows.     . Patient needs Support, Education, and Care Coordination in order to meet unmet mental health needs. . Patient is unable to independently navigate community resource  options without care coordination support. . Limited social support and family/relationship dysfunction. Clinical Social Work [employer]):  Marland Kitchen Over the next 90 days, patient will work with LCSW weekly by telephone to reduce and manage symptoms of Anxiety until connected for ongoing counseling resources, or until symptoms of Anxiety are reduced and better managed. . Patient will implement clinical interventions discussed today to decrease symptoms of Anxiety and increase knowledge and/or ability of: coping skills, healthy habits, self-management skills, and stress reduction. . Over the next 90 days, patient will demonstrate improved adherence to prescribed treatment plan for Anxiety, as evidenced by a reduction in symptoms.  Interventions:  . Collaboration with [name], MD regarding development and update of comprehensive plan of care as evidenced by provider attestation and co-signature. . Assessed patient's understanding, education, previous treatment and care coordination needs.  . Patient interviewed and appropriate assessments performed: Review of PHQ 2 and PHQ 9 Depression Screening scores. . Provided basic mental health support, education and interventions.  Nash Dimmer with appropriate clinical care team members regarding patient needs. . Discussed several options for long-term counseling based on need and insurance.  . Reviewed mental health medications with patient prescribed by PCP and discussed compliance.  . Other interventions include: Motivational Interviewing, Solution-Focused Strategies, Mindfulness Meditation, Deep Breathing Exercises, Relaxation Techniques, Brief Cognitive Behavioral Therapy, Emotional/Supportive Counseling,  Psychotropic Medication Adherence Assessment, Sleep Hygiene, Problem Solving and Teaching/Coaching Strategies.   Self-Care Activities: . Self administers medications as prescribed. . Attends all scheduled provider appointments. . Calls pharmacy for  medication refills. . Performs ADL's/IADL's independently. . Calls provider office for new concerns or questions. . Lives independently and has good, strong family support. . Motivated for treatment. Patient Goals: . Implement clinical interventions discussed today. . Review EMMI Educational Material pertaining specifically to Generalized Anxiety Disorder; Tips to Help You Cope in Uncertain Times; Stress; and Tips To Help You Worry Less. . Review and pratice proper use of Deep Breathing Exercises, Relaxation Techniques and Mindfulness Mediation Strategies, daily. . Review and educate self on 12 Common Symptoms of Depression that Shouldn't Be Ignored and Stress Management - Breathing Exercises for Relaxation. . Continue to receive personal counseling with LCSW weekly, beginning on 09/27/2020. Marland Kitchen Continue to check out volunteer opportunities. . Talk about feelings with friends, family members and/or neighbors. . Practice positive thinking and self-talk.  . Work on establishing a better relationship with your future daughter-in-law, as well as your son, especially while they are residing in your home for the next two weeks. Follow Up Plan: Telephone follow up appointment with care management team member scheduled for: 10/22/2020 at 11:00am.      Follow Up Plan: LCSW will follow up with patient by phone on 10/22/2020 at 11:00am.      Nat Christen LCSW Licensed Clinical Social Worker [place]  [phone]

## 2020-10-08 NOTE — Patient Instructions (Signed)
Visit Information  PATIENT GOALS: Goals Addressed              This Visit's Progress   .  Reduce and Manage My Symptoms of Anxiety. (pt-stated)   On track     Timeframe:  Long-Range Goal Priority:  High Start Date:   09/27/2020                         Expected End Date:  12/13/2020                      Follow Up Date:  10/22/2020 at 11:00am.  Patient Goals: . Implement clinical interventions discussed today. . Review EMMI Educational Material pertaining specifically to Generalized Anxiety Disorder; Tips to Help You Cope in Uncertain Times; Stress; and Tips To Help You Worry Less. . Review and pratice proper use of Deep Breathing Exercises, Relaxation Techniques and Mindfulness Mediation Strategies, daily. . Review and educate self on 12 Common Symptoms of Depression that Shouldn't Be Ignored and Stress Management - Breathing Exercises for Relaxation. . Continue to receive personal counseling with LCSW weekly, beginning on 09/27/2020. Marland Kitchen Continue to check out volunteer opportunities. . Talk about feelings with friends, family members and/or neighbors. . Practice positive thinking and self-talk.  . Work on establishing a better relationship with your future daughter-in-law, as well as your son, especially while they are residing in your home for the next two weeks.         Patient verbalizes understanding of instructions provided today and agrees to view in [place].   Telephone follow up appointment with care management team member scheduled for:  10/22/2020 at 11:00am.  Nat Christen LCSW Licensed Clinical Social Worker [place]  [phone]

## 2020-10-19 ENCOUNTER — Other Ambulatory Visit: Payer: Self-pay | Admitting: Internal Medicine

## 2020-10-22 ENCOUNTER — Ambulatory Visit: Payer: Medicare Other | Admitting: *Deleted

## 2020-10-22 DIAGNOSIS — Z794 Long term (current) use of insulin: Secondary | ICD-10-CM

## 2020-10-22 DIAGNOSIS — F411 Generalized anxiety disorder: Secondary | ICD-10-CM

## 2020-10-22 DIAGNOSIS — E1165 Type 2 diabetes mellitus with hyperglycemia: Secondary | ICD-10-CM

## 2020-10-22 DIAGNOSIS — G894 Chronic pain syndrome: Secondary | ICD-10-CM

## 2020-10-22 NOTE — Chronic Care Management (AMB) (Signed)
Chronic Care Management    Clinical Social Work Note  10/22/2020 Name: Laura Reed MRN: [id number] DOB: Aug 06, 1960  Laura Reed is a 60 y.o. year old female who is a primary care patient of [name], MD. The CCM team was consulted to assist the patient with chronic disease management and/or care coordination needs related to: [place] and Resources to reduce and manage symptoms of Anxiety, Insomnia Secondary to Anxiety, Chronic Pain and Controlled Type II Diabetes Mellitus w/ Peripheral Circulatory Disorder.   Engaged with patient by telephone for follow up visit in response to provider referral for social work chronic care management and care coordination services.   Consent to Services:  The patient was given information about Chronic Care Management services, agreed to services, and gave verbal consent prior to initiation of services.  Please see initial visit note for detailed documentation.   Patient agreed to services and consent obtained.   Assessment: Review of patient past medical history, allergies, medications, and health status, including review of relevant consultants reports was performed today as part of a comprehensive evaluation and provision of chronic care management and care coordination services.     SDOH (Social Determinants of Health) assessments and interventions performed:    Advanced Directives Status: Not addressed in this encounter.  CCM Care Plan  No Known Allergies  Outpatient Encounter Medications as of 10/22/2020  Medication Sig  . acetaminophen (TYLENOL) 500 MG tablet Take 1,000 mg by mouth every 6 (six) hours as needed for mild pain or headache.   . ALPRAZolam (XANAX) 0.5 MG tablet TAKE 1 TABLET(0.5 MG) BY MOUTH DAILY AS NEEDED FOR ANXIETY  . amLODipine (NORVASC) 5 MG tablet TAKE 1 TABLET(5 MG) BY MOUTH DAILY  . aspirin 81 MG chewable tablet Chew 81 mg by mouth at bedtime.   Marland Kitchen atorvastatin (LIPITOR) 20 MG tablet Take 1  tablet (20 mg total) by mouth daily.  . Cholecalciferol (VITAMIN D3) 2000 UNITS TABS Take 2,000 Units by mouth daily after supper.   . clopidogrel (PLAVIX) 75 MG tablet TAKE 1 TABLET(75 MG) BY MOUTH DAILY  . Continuous Blood Gluc Sensor (FREESTYLE LIBRE 2 SENSOR) MISC Use to check sugar at least 4 times daily  . empagliflozin (JARDIANCE) 10 MG TABS tablet Take 1 tablet (10 mg total) by mouth daily before breakfast.  . gabapentin (NEURONTIN) 800 MG tablet Take 1 tablet (800 mg total) by mouth 3 (three) times daily.  . insulin degludec (TRESIBA FLEXTOUCH) 100 UNIT/ML FlexTouch Pen Inject 22 Units into the skin daily.  . insulin lispro (HUMALOG) 100 UNIT/ML KwikPen INJECT 10 UNITS UNDER THE SKIN THREE TIMES DAILY BEFORE MEALS (Patient not taking: Reported on 09/25/2020)  . Insulin Pen Needle (PEN NEEDLES) 32G X 4 MM MISC Use to take insulin daily (Patient not taking: Reported on 09/25/2020)  . Lancets Misc. MISC Use to check blood sugars 4 times daily  . losartan (COZAAR) 50 MG tablet TAKE 1 TABLET(50 MG) BY MOUTH DAILY  . Multiple Vitamin (MULTIVITAMIN) tablet Take 1 tablet by mouth daily.  Marland Kitchen OZEMPIC, 1 MG/DOSE, 4 MG/3ML SOPN INJECT 1 MG UNDER THE SKIN ONCE WEEKLY   No facility-administered encounter medications on file as of 10/22/2020.    Patient Active Problem List   Diagnosis Date Noted  . Atherosclerosis of artery of extremity with ulceration ([place]) 07/06/2020  . Anxiety state 04/01/2017  . Atherosclerosis of native arteries of extremity with intermittent claudication ([place]) 01/18/2017  . Carotid stenosis 12/04/2016  . Insomnia secondary  to anxiety 02/21/2016  . Thyroid nodule 02/21/2016  . Hospital discharge follow-up 11/11/2015  . Encounter for preventive health examination 08/02/2015  . S/p nephrectomy 05/15/2015  . Diabetic nephropathy with proteinuria ([place]) 01/27/2015  . Overweight (BMI 25.0-29.9) 10/07/2014  . History of renal cell carcinoma 09/28/2014  . CAD (coronary artery  disease) 08/24/2014  . Hernia of abdominal cavity 08/10/2014  . Hyperlipidemia associated with type 2 diabetes mellitus ([place]) 06/17/2014  . S/P carotid endarterectomy 01/22/2014  . History of shingles 05/18/2013  . Preoperative evaluation of a medical condition to rule out surgical contraindications (TAR required) 04/06/2013  . Diabetic peripheral neuropathy associated with type 2 diabetes mellitus ([place]) 04/06/2013  . Chronic hip pain 01/16/2013  . Routine general medical examination at a health care facility 06/26/2012  . PVD (peripheral vascular disease) ([place])   . History of tobacco abuse 04/29/2011  . Controlled type 2 DM with peripheral circulatory disorder ([place])   . Hypertension   . Hemorrhoid     Conditions to be addressed/monitored: Mental Health Counseling and Resources to reduce and manage symptoms of Anxiety, Insomnia Secondary to Anxiety, Chronic Pain and Controlled Type II Diabetes Mellitus w/ Peripheral Circulatory Disorder.  Mental Health Concerns, Family and Relationship Dysfunction, Social Isolation and Limited Access to Caregiver.  Care Plan : LCSW Plan of Care  Updates made by Francis Gaines, LCSW since 10/22/2020 12:00 AM    Problem: Reduce and Manage My Symptoms of Anxiety.   Priority: High    Long-Range Goal: Reduce and Manage My Symptoms of Anxiety.   Start Date: 09/27/2020  Expected End Date: 12/13/2020  This Visit's Progress: On track  Recent Progress: On track  Priority: High  Note:   Current Barriers:  . Unable to independently manage symptoms of Anxiety. . Knowledge deficits related to accessing mental health provider in patient with Anxiety.  . Patient is experiencing symptoms of Anxiety, which seems to be exacerbated by her son's recent engagement to a woman he met online that he barely knows.     . Patient needs Support, Education, and Care Coordination in order to meet unmet mental health needs. . Patient is unable to independently navigate  community resource options without care coordination support. . Limited social support and family/relationship dysfunction. Clinical Social Work [employer]):  Marland Kitchen Over the next 90 days, patient will work with LCSW bi-weekly by telephone to reduce and manage symptoms of Anxiety until connected for ongoing counseling resources, or until symptoms of Anxiety are reduced and better managed. . Patient will implement clinical interventions discussed today to decrease symptoms of Anxiety and increase knowledge and/or ability of: coping skills, healthy habits, self-management skills, and stress reduction. . Over the next 90 days, patient will demonstrate improved adherence to prescribed treatment plan for Anxiety, as evidenced by a reduction in symptoms.  Interventions:  . Collaboration with [name], MD regarding development and update of comprehensive plan of care as evidenced by provider attestation and co-signature. . Assessed patient's understanding, education, previous treatment and care coordination needs.  . Patient interviewed and appropriate assessments performed: Review of PHQ 2 and PHQ 9 Depression Screening scores. . Provided basic mental health support, education and interventions.  Nash Dimmer with appropriate clinical care team members regarding patient needs. . Discussed several options for long-term counseling based on need and insurance.  . Reviewed mental health medications with patient prescribed by PCP and discussed compliance.  . Other interventions include: Motivational Interviewing, Solution-Focused Strategies, Mindfulness Meditation, Deep Breathing Exercises, Relaxation Techniques,  Brief Cognitive Behavioral Therapy, Emotional/Supportive Counseling, Psychotropic Medication Adherence Assessment, Sleep Hygiene, Problem Solving and Teaching/Coaching Strategies.   Self-Care Activities: . Self administers medications as prescribed. . Attends all scheduled provider  appointments. . Calls pharmacy for medication refills. . Performs ADL's/IADL's independently. . Calls provider office for new concerns or questions. . Lives independently and has good, strong family support. . Motivated for treatment. Patient Goals: . Implement clinical interventions discussed today. . Continue to pratice proper use of Deep Breathing Exercises, Relaxation Techniques and Mindfulness Mediation Strategies, daily. . Continue to review and educate self on 12 Common Symptoms of Depression that Shouldn't Be Ignored and Stress Management - Breathing Exercises for Relaxation. . Continue to receive personal counseling with LCSW bi-weekly, beginning on 09/27/2020. . Talk about feelings with friends, family members and/or neighbors. . Practice positive thinking and self-talk.  . Work on establishing a better relationship with your future daughter-in-law, as well as your son. . Try to avoid stress eating and continue to avoid smoking, especially under times of high stress and anxiety. Follow Up Plan: Telephone follow up appointment with care management team member scheduled for: 11/05/2020 at 11:00am.      Follow Up Plan: LCSW will follow up with patient by phone on 11/05/2020 at 11:00am.      Nat Christen LCSW Licensed Clinical Social Worker [place]  [phone]

## 2020-10-22 NOTE — Patient Instructions (Signed)
Visit Information  PATIENT GOALS: Goals Addressed              This Visit's Progress   .  Reduce and Manage My Symptoms of Anxiety. (pt-stated)   On track     Timeframe:  Long-Range Goal Priority:  High Start Date:   09/27/2020                         Expected End Date:  12/13/2020                      Follow Up Date:  11/05/2020 at 11:00am.  Patient Goals: . Implement clinical interventions discussed today. . Continue to pratice proper use of Deep Breathing Exercises, Relaxation Techniques and Mindfulness Mediation Strategies, daily. . Continue to review and educate self on 12 Common Symptoms of Depression that Shouldn't Be Ignored and Stress Management - Breathing Exercises for Relaxation. . Continue to receive personal counseling with LCSW bi-weekly, beginning on 09/27/2020. . Talk about feelings with friends, family members and/or neighbors. . Practice positive thinking and self-talk.  . Work on establishing a better relationship with your future daughter-in-law, as well as your son. . Try to avoid stress eating and continue to avoid smoking, especially under times of high stress and anxiety.         Patient verbalizes understanding of instructions provided today and agrees to view in [place].   Telephone follow up appointment with care management team member scheduled for:  11/05/2020 at 11:00am.  Nat Christen LCSW Licensed Clinical Social Worker [place]  [phone]

## 2020-10-29 DIAGNOSIS — E118 Type 2 diabetes mellitus with unspecified complications: Secondary | ICD-10-CM | POA: Diagnosis not present

## 2020-10-29 DIAGNOSIS — Z794 Long term (current) use of insulin: Secondary | ICD-10-CM | POA: Diagnosis not present

## 2020-11-01 DIAGNOSIS — E113512 Type 2 diabetes mellitus with proliferative diabetic retinopathy with macular edema, left eye: Secondary | ICD-10-CM | POA: Diagnosis not present

## 2020-11-05 ENCOUNTER — Ambulatory Visit (INDEPENDENT_AMBULATORY_CARE_PROVIDER_SITE_OTHER): Payer: Medicare Other | Admitting: *Deleted

## 2020-11-05 DIAGNOSIS — E1151 Type 2 diabetes mellitus with diabetic peripheral angiopathy without gangrene: Secondary | ICD-10-CM | POA: Diagnosis not present

## 2020-11-05 DIAGNOSIS — E1142 Type 2 diabetes mellitus with diabetic polyneuropathy: Secondary | ICD-10-CM

## 2020-11-05 DIAGNOSIS — I25118 Atherosclerotic heart disease of native coronary artery with other forms of angina pectoris: Secondary | ICD-10-CM | POA: Diagnosis not present

## 2020-11-05 DIAGNOSIS — G894 Chronic pain syndrome: Secondary | ICD-10-CM

## 2020-11-05 DIAGNOSIS — I739 Peripheral vascular disease, unspecified: Secondary | ICD-10-CM

## 2020-11-05 DIAGNOSIS — F411 Generalized anxiety disorder: Secondary | ICD-10-CM

## 2020-11-05 NOTE — Patient Instructions (Signed)
Visit Information  PATIENT GOALS: Goals Addressed              This Visit's Progress   .  COMPLETED: Reduce and Manage My Symptoms of Anxiety. (pt-stated)   On track     Timeframe:  Long-Range Goal Priority:  High Start Date:   09/27/2020                         Expected End Date:  11/05/2020                    Follow Up Date: No Follow-Up Required.  Patient Goals: . Continue to pratice proper use of Deep Breathing Exercises, Relaxation Techniques and Mindfulness Mediation Strategies, daily. . Talk about feelings with friends, family members and/or neighbors, daily. . Practice positive thinking and self-talk, daily.  . Continue to work on building a better relationship with your future daughter-in-law, as well as your son. . Try to avoid stress eating and continue to avoid smoking, especially under times of high stress and anxiety.         Patient verbalizes understanding of instructions provided today and agrees to view in [place].   No Follow-Up Required  Laura Christen LCSW Licensed Clinical Social Worker [place]  [phone]

## 2020-11-05 NOTE — Chronic Care Management (AMB) (Signed)
Chronic Care Management    Clinical Social Work Note  11/05/2020 Name: Laura Reed MRN: [id number] DOB: July 26, 1960  Laura Reed is a 60 y.o. year old female who is a primary care patient of Laura Mc, MD. The CCM team was consulted to assist the patient with chronic disease management and/or care coordination needs related to: Laura Reed and Resources for Patient with Anxiety, Insomnia Secondary to Anxiety, Chronic Pain and Controlled Type II Diabetes Mellitus w/ Peripheral Circulatory Disorder.   Engaged with patient by telephone for follow up visit in response to provider referral for social work chronic care management and care coordination services.   Consent to Services:  The patient was given information about Chronic Care Management services, agreed to services, and gave verbal consent prior to initiation of services.  Please see initial visit note for detailed documentation.   Patient agreed to services and consent obtained.   Assessment: Review of patient past medical history, allergies, medications, and health status, including review of relevant consultants reports was performed today as part of a comprehensive evaluation and provision of chronic care management and care coordination services.     SDOH (Social Determinants of Health) assessments and interventions performed:    Advanced Directives Status: Not ready or willing to discuss.  CCM Care Plan  No Known Allergies  Outpatient Encounter Medications as of 11/05/2020  Medication Sig  . acetaminophen (TYLENOL) 500 MG tablet Take 1,000 mg by mouth every 6 (six) hours as needed for mild pain or headache.   . ALPRAZolam (XANAX) 0.5 MG tablet TAKE 1 TABLET(0.5 MG) BY MOUTH DAILY AS NEEDED FOR ANXIETY  . amLODipine (NORVASC) 5 MG tablet TAKE 1 TABLET(5 MG) BY MOUTH DAILY  . aspirin 81 MG chewable tablet Chew 81 mg by mouth at bedtime.   Marland Kitchen atorvastatin (LIPITOR) 20 MG tablet Take 1 tablet (20 mg total)  by mouth daily.  . Cholecalciferol (VITAMIN D3) 2000 UNITS TABS Take 2,000 Units by mouth daily after supper.   . clopidogrel (PLAVIX) 75 MG tablet TAKE 1 TABLET(75 MG) BY MOUTH DAILY  . Continuous Blood Gluc Sensor (FREESTYLE LIBRE 2 SENSOR) MISC Use to check sugar at least 4 times daily  . empagliflozin (JARDIANCE) 10 MG TABS tablet Take 1 tablet (10 mg total) by mouth daily before breakfast.  . gabapentin (NEURONTIN) 800 MG tablet Take 1 tablet (800 mg total) by mouth 3 (three) times daily.  . insulin degludec (TRESIBA FLEXTOUCH) 100 UNIT/ML FlexTouch Pen Inject 22 Units into the skin daily.  . insulin lispro (HUMALOG) 100 UNIT/ML KwikPen INJECT 10 UNITS UNDER THE SKIN THREE TIMES DAILY BEFORE MEALS (Patient not taking: Reported on 09/25/2020)  . Insulin Pen Needle (PEN NEEDLES) 32G X 4 MM MISC Use to take insulin daily (Patient not taking: Reported on 09/25/2020)  . Lancets Misc. MISC Use to check blood sugars 4 times daily  . losartan (COZAAR) 50 MG tablet TAKE 1 TABLET(50 MG) BY MOUTH DAILY  . Multiple Vitamin (MULTIVITAMIN) tablet Take 1 tablet by mouth daily.  Marland Kitchen OZEMPIC, 1 MG/DOSE, 4 MG/3ML SOPN INJECT 1 MG UNDER THE SKIN ONCE WEEKLY   No facility-administered encounter medications on file as of 11/05/2020.    Patient Active Problem List   Diagnosis Date Noted  . Atherosclerosis of artery of extremity with ulceration ([place]) 07/06/2020  . Anxiety state 04/01/2017  . Atherosclerosis of native arteries of extremity with intermittent claudication ([place]) 01/18/2017  . Carotid stenosis 12/04/2016  . Insomnia secondary to anxiety  02/21/2016  . Thyroid nodule 02/21/2016  . Hospital discharge follow-up 11/11/2015  . Encounter for preventive health examination 08/02/2015  . S/p nephrectomy 05/15/2015  . Diabetic nephropathy with proteinuria ([place]) 01/27/2015  . Overweight (BMI 25.0-29.9) 10/07/2014  . History of renal cell carcinoma 09/28/2014  . CAD (coronary artery disease) 08/24/2014  .  Hernia of abdominal cavity 08/10/2014  . Hyperlipidemia associated with type 2 diabetes mellitus ([place]) 06/17/2014  . S/P carotid endarterectomy 01/22/2014  . History of shingles 05/18/2013  . Preoperative evaluation of a medical condition to rule out surgical contraindications (TAR required) 04/06/2013  . Diabetic peripheral neuropathy associated with type 2 diabetes mellitus ([place]) 04/06/2013  . Chronic hip pain 01/16/2013  . Routine general medical examination at a health care facility 06/26/2012  . PVD (peripheral vascular disease) ([place])   . History of tobacco abuse 04/29/2011  . Controlled type 2 DM with peripheral circulatory disorder ([place])   . Hypertension   . Hemorrhoid     Conditions to be addressed/monitored: Mental Health Counseling and Resources for Patient with Anxiety, Insomnia Secondary to Anxiety, Chronic Pain and Controlled Type II Diabetes Mellitus w/ Peripheral Circulatory Disorder.  Limited Social Support, Mental Health Concerns, Family and Relationship Dysfunction, Social Isolation and Limited Access to Caregiver.  Care Plan : LCSW Plan of Care  Updates made by Francis Gaines, LCSW since 11/05/2020 12:00 AM    Problem: Reduce and Manage My Symptoms of Anxiety. Resolved 11/05/2020  Priority: High    Long-Range Goal: Reduce and Manage My Symptoms of Anxiety. Completed 11/05/2020  Start Date: 09/27/2020  Expected End Date: 11/05/2020  This Visit's Progress: On track  Recent Progress: On track  Priority: High  Note:   Current Barriers:  . Unable to independently manage symptoms of Anxiety. . Knowledge deficits related to accessing mental health provider in patient with Anxiety.  . Patient is experiencing symptoms of Anxiety, which seems to be exacerbated by her son's recent engagement to a woman he met online that he barely knows.     . Patient needs Support, Education, and Care Coordination in order to meet unmet mental health needs. . Patient is unable to independently  navigate community resource options without care coordination support. . Limited social support and family/relationship dysfunction. Clinical Social Work [employer]):  Marland Kitchen Over the next 90 days, patient will work with LCSW bi-weekly by telephone to reduce and manage symptoms of Anxiety until connected for ongoing counseling resources, or until symptoms of Anxiety are reduced and better managed. . Patient will implement clinical interventions discussed today to decrease symptoms of Anxiety and increase knowledge and/or ability of: coping skills, healthy habits, self-management skills, and stress reduction. . Over the next 90 days, patient will demonstrate improved adherence to prescribed treatment plan for Anxiety, as evidenced by a reduction in symptoms.  Interventions:  . Collaboration with Laura Mc, MD regarding development and update of comprehensive plan of care as evidenced by provider attestation and co-signature. . Assessed patient's understanding, education, previous treatment and care coordination needs.  . Patient interviewed and appropriate assessments performed: Review of PHQ 2 and PHQ 9 Depression Screening scores. . Provided basic mental health support, education and interventions.  Nash Dimmer with appropriate clinical care team members regarding patient needs. . Discussed several options for long-term counseling based on need and insurance.  . Reviewed mental health medications with patient prescribed by PCP and discussed compliance.  . Other interventions include: Motivational Interviewing, Solution-Focused Strategies, Mindfulness Meditation, Deep Breathing Exercises, Relaxation Techniques,  Brief Cognitive Behavioral Therapy, Emotional/Supportive Counseling, Psychotropic Medication Adherence Assessment, Sleep Hygiene, Problem Solving and Teaching/Coaching Strategies.   Self-Care Activities: . Self administers medications as prescribed. . Attends all scheduled provider  appointments. . Calls pharmacy for medication refills. . Performs ADL's/IADL's independently. . Calls provider office for new concerns or questions. . Lives independently and has good, strong family support. . Motivated for treatment. Patient Goals: . Continue to pratice proper use of Deep Breathing Exercises, Relaxation Techniques and Mindfulness Mediation Strategies, daily. . Talk about feelings with friends, family members and/or neighbors, daily. . Practice positive thinking and self-talk, daily.  . Continue to work on building a better relationship with your future daughter-in-law, as well as your son. . Try to avoid stress eating and continue to avoid smoking, especially under times of high stress and anxiety. Follow-Up:  No Follow-Up Required.      Follow-Up:  No Follow-Up Required.  Nat Christen LCSW Licensed Clinical Social Worker [place]  [phone]

## 2020-11-09 ENCOUNTER — Ambulatory Visit: Payer: Medicare Other | Admitting: Internal Medicine

## 2020-11-09 ENCOUNTER — Other Ambulatory Visit (INDEPENDENT_AMBULATORY_CARE_PROVIDER_SITE_OTHER): Payer: Self-pay | Admitting: Vascular Surgery

## 2020-11-09 DIAGNOSIS — I739 Peripheral vascular disease, unspecified: Secondary | ICD-10-CM

## 2020-11-09 DIAGNOSIS — I6522 Occlusion and stenosis of left carotid artery: Secondary | ICD-10-CM

## 2020-11-12 ENCOUNTER — Other Ambulatory Visit: Payer: Self-pay

## 2020-11-12 ENCOUNTER — Ambulatory Visit (INDEPENDENT_AMBULATORY_CARE_PROVIDER_SITE_OTHER): Payer: Medicare Other

## 2020-11-12 ENCOUNTER — Ambulatory Visit (INDEPENDENT_AMBULATORY_CARE_PROVIDER_SITE_OTHER): Payer: Medicare Other | Admitting: Vascular Surgery

## 2020-11-12 ENCOUNTER — Other Ambulatory Visit (INDEPENDENT_AMBULATORY_CARE_PROVIDER_SITE_OTHER): Payer: Self-pay | Admitting: Vascular Surgery

## 2020-11-12 ENCOUNTER — Encounter (INDEPENDENT_AMBULATORY_CARE_PROVIDER_SITE_OTHER): Payer: Self-pay | Admitting: Vascular Surgery

## 2020-11-12 VITALS — BP 92/63 | HR 89 | Resp 16 | Wt 147.4 lb

## 2020-11-12 DIAGNOSIS — E1151 Type 2 diabetes mellitus with diabetic peripheral angiopathy without gangrene: Secondary | ICD-10-CM

## 2020-11-12 DIAGNOSIS — I70213 Atherosclerosis of native arteries of extremities with intermittent claudication, bilateral legs: Secondary | ICD-10-CM | POA: Diagnosis not present

## 2020-11-12 DIAGNOSIS — I739 Peripheral vascular disease, unspecified: Secondary | ICD-10-CM | POA: Diagnosis not present

## 2020-11-12 DIAGNOSIS — I6522 Occlusion and stenosis of left carotid artery: Secondary | ICD-10-CM | POA: Diagnosis not present

## 2020-11-12 DIAGNOSIS — I10 Essential (primary) hypertension: Secondary | ICD-10-CM | POA: Diagnosis not present

## 2020-11-12 DIAGNOSIS — I25118 Atherosclerotic heart disease of native coronary artery with other forms of angina pectoris: Secondary | ICD-10-CM

## 2020-11-12 DIAGNOSIS — I6523 Occlusion and stenosis of bilateral carotid arteries: Secondary | ICD-10-CM | POA: Diagnosis not present

## 2020-11-12 NOTE — Progress Notes (Signed)
MRN : [id number]  Laura Reed is a 60 y.o. ([birth year]) female who presents with chief complaint of  Chief Complaint  Patient presents with  . Follow-up    6 month ultrasound follow up  .  History of Present Illness:  The patient returns to the office for followup and review status post angiogram with intervention.  She is s/p angiogram on 05/05/2018: 1.Percutaneous transluminal angioplasty and stent placementleftpopliteal 2. Percutaneous transluminal angioplasty of the right anterior tibial artery to 3 mm  The patient notes improvement in the lower extremity symptoms. No interval shortening of the patient's claudication distance or rest pain symptoms. Previous wounds have now healed. No new ulcers or wounds have occurred since the last visit.  There have been no significant changes to the patient's overall health care.  The patient denies amaurosis fugax or recent TIA symptoms. There are no recent neurological changes noted. The patient denies history of DVT, PE or superficial thrombophlebitis. The patient denies recent episodes of angina or shortness of breath.   ABI's Rt=1.01and Lt=0.60(previous Rt=0.84and Lt=0.79) Duplex ultrasound done today of the left lower extremity shows patent bypass with occlusion of the distal popliteal  Current Meds  Medication Sig  . acetaminophen (TYLENOL) 500 MG tablet Take 1,000 mg by mouth every 6 (six) hours as needed for mild pain or headache.   . ALPRAZolam (XANAX) 0.5 MG tablet TAKE 1 TABLET(0.5 MG) BY MOUTH DAILY AS NEEDED FOR ANXIETY  . amLODipine (NORVASC) 5 MG tablet TAKE 1 TABLET(5 MG) BY MOUTH DAILY  . aspirin 81 MG chewable tablet Chew 81 mg by mouth at bedtime.   Marland Kitchen atorvastatin (LIPITOR) 20 MG tablet Take 1 tablet (20 mg total) by mouth daily.  . Cholecalciferol (VITAMIN D3) 2000 UNITS TABS Take 2,000 Units by mouth daily after supper.   . clopidogrel (PLAVIX) 75 MG tablet TAKE 1 TABLET(75 MG) BY MOUTH  DAILY  . Continuous Blood Gluc Sensor (FREESTYLE LIBRE 2 SENSOR) MISC Use to check sugar at least 4 times daily  . empagliflozin (JARDIANCE) 10 MG TABS tablet Take 1 tablet (10 mg total) by mouth daily before breakfast.  . gabapentin (NEURONTIN) 800 MG tablet Take 1 tablet (800 mg total) by mouth 3 (three) times daily.  . insulin degludec (TRESIBA FLEXTOUCH) 100 UNIT/ML FlexTouch Pen Inject 22 Units into the skin daily.  . Lancets Misc. MISC Use to check blood sugars 4 times daily  . losartan (COZAAR) 50 MG tablet TAKE 1 TABLET(50 MG) BY MOUTH DAILY  . Multiple Vitamin (MULTIVITAMIN) tablet Take 1 tablet by mouth daily.  Marland Kitchen OZEMPIC, 1 MG/DOSE, 4 MG/3ML SOPN INJECT 1 MG UNDER THE SKIN ONCE WEEKLY    Past Medical History:  Diagnosis Date  . Absence of kidney    left  . Anxiety   . Arthritis   . Bladder cancer ([place])   . CHF (congestive heart failure) ([place])   . Complication of anesthesia    BP HAS  RUN LOW AFTER SURGERY-LUNGS FILLED UP WITH FLUID AFTER  LEG STENT SURGERY   . Coronary artery disease   . Diabetes mellitus   . Family history of adverse reaction to anesthesia    Sister - PONV  . GERD (gastroesophageal reflux disease)    OCC TUMS  . Heart murmur   . Hemorrhoid   . History of methicillin resistant staphylococcus aureus (MRSA) 2007  . Hypertension   . Neuropathy   . PVD (peripheral vascular disease) ([place])   . Thyroid nodule  right  . Urothelial carcinoma of kidney ([place]) 10/31/2014   INVASIVE UROTHELIAL CARCINOMA, LOW GRADE. T1, Nx.  . Wears dentures    full upper and lower    Past Surgical History:  Procedure Laterality Date  . AMPUTATION TOE     right (4th and 5th); left (great toe, 3rd)  . AMPUTATION TOE Right 07/16/2018   Procedure: AMPUTATION TOE/MPJ right 2nd;  Surgeon: Sharlotte Alamo, DPM;  Location: [facility];  Service: Podiatry;  Laterality: Right;  . ARTERIAL BYPASS SURGRY  2009, 2013 x 2   right leg , done in [place]  . CARDIAC CATHETERIZATION    .  CAROTID ENDARTERECTOMY Right 01/2014   Dr Delana Meyer  . CATARACT EXTRACTION W/PHACO Right 12/14/2014   Procedure: CATARACT EXTRACTION PHACO AND INTRAOCULAR LENS PLACEMENT (IOC);  Surgeon: Lyla Glassing, MD;  Location: [facility];  Service: Ophthalmology;  Laterality: Right;  [place]   00:38.6              AP        7.1                   CDE  2.76  . CATARACT EXTRACTION W/PHACO Left 12/06/2019   Procedure: CATARACT EXTRACTION PHACO AND INTRAOCULAR LENS PLACEMENT ([place]) LEFT DIABETIC;  Surgeon: Birder Robson, MD;  Location: [place];  Service: Ophthalmology;  Laterality: Left;  9.08 1:06.4  . CESAREAN SECTION    . CHOLECYSTECTOMY  03-03-12   Porcelain gallbladder, gallstones,  [place]  . COLONOSCOPY W/ BIOPSIES  04/28/2012   Hyperplastic rectal polyps.  . CORONARY ARTERY BYPASS GRAFT  2009   3 vessel  . CYSTOSCOPY W/ RETROGRADES Right 09/01/2016   Procedure: CYSTOSCOPY WITH RETROGRADE PYELOGRAM;  Surgeon: Hollice Espy, MD;  Location: [facility];  Service: Urology;  Laterality: Right;  . CYSTOSCOPY W/ RETROGRADES Bilateral 03/19/2020   Procedure: CYSTOSCOPY WITH RETROGRADE PYELOGRAM;  Surgeon: Hollice Espy, MD;  Location: [facility];  Service: Urology;  Laterality: Bilateral;  . CYSTOSCOPY WITH BIOPSY N/A 03/19/2020   Procedure: CYSTOSCOPY WITH BIOPSY;  Surgeon: Hollice Espy, MD;  Location: [facility];  Service: Urology;  Laterality: N/A;  . EYE SURGERY    . HERNIA REPAIR  10-31-14   ventral, retro-rectus atrium mesh  . IRRIGATION AND DEBRIDEMENT FOOT Left 01/18/2019   Procedure: IRRIGATION AND DEBRIDEMENT FOOT;  Surgeon: Sharlotte Alamo, DPM;  Location: [facility];  Service: Podiatry;  Laterality: Left;  . LOWER EXTREMITY ANGIOGRAPHY Left 12/10/2016   Procedure: Lower Extremity Angiography;  Surgeon: Katha Cabal, MD;  Location: [facility];  Service: Cardiovascular;  Laterality: Left;  . LOWER EXTREMITY ANGIOGRAPHY Left 02/02/2018   Procedure: LOWER EXTREMITY ANGIOGRAPHY;  Surgeon:  Katha Cabal, MD;  Location: [facility];  Service: Cardiovascular;  Laterality: Left;  . LOWER EXTREMITY ANGIOGRAPHY Left 05/05/2018   Procedure: LOWER EXTREMITY ANGIOGRAPHY;  Surgeon: Katha Cabal, MD;  Location: [facility];  Service: Cardiovascular;  Laterality: Left;  . NEPHRECTOMY Left 10-31-14  . PERIPHERAL VASCULAR CATHETERIZATION Left 05/01/2015   Procedure: Lower Extremity Angiography;  Surgeon: Katha Cabal, MD;  Location: [facility];  Service: Cardiovascular;  Laterality: Left;  . PERIPHERAL VASCULAR CATHETERIZATION  05/01/2015   Procedure: Lower Extremity Intervention;  Surgeon: Katha Cabal, MD;  Location: [place];  Service: Cardiovascular;;  . PERIPHERAL VASCULAR CATHETERIZATION Left 02/20/2015   Procedure: Pelvic Angiography;  Surgeon: Katha Cabal, MD;  Location: [facility];  Service: Cardiovascular;  Laterality: Left;  . TRANSURETHRAL RESECTION OF BLADDER TUMOR WITH MITOMYCIN-C N/A 09/01/2016   Procedure: TRANSURETHRAL RESECTION OF BLADDER TUMOR WITH MITOMYCIN-C;  Surgeon: Hollice Espy, MD;  Location: [facility];  Service: Urology;  Laterality: N/A;    Social History Social History   Tobacco Use  . Smoking status: Former Smoker    Packs/day: 2.00    Years: 35.00    Pack years: 70.00    Types: Cigarettes    Quit date: 03/29/2013    Years since quitting: 7.6  . Smokeless tobacco: Never Used  Vaping Use  . Vaping Use: Former  Substance Use Topics  . Alcohol use: Not Currently    Alcohol/week: 0.0 standard drinks    Comment: LAST DRINK 2009  . Drug use: Not Currently    Types: Cocaine    Comment: last used in 2007    Family History Family History  Problem Relation Age of Onset  . Cancer Mother 27       Lung Cancer  . Cancer Father 33       Lung Ca  . Diabetes Son   . Breast cancer Maternal Grandmother   . Kidney cancer Neg Hx   . Bladder Cancer Neg Hx   . Prostate cancer Neg Hx      No Known Allergies   REVIEW OF SYSTEMS (Negative unless checked)  Constitutional: [] Weight loss  [] Fever  [] Chills Cardiac: [] Chest pain   [] Chest pressure   [] Palpitations   [] Shortness of breath when laying flat   [] Shortness of breath with exertion. Vascular:  [x] Pain in legs with walking   [] Pain in legs at rest  [] History of DVT   [] Phlebitis   [] Swelling in legs   [] Varicose veins   [] Non-healing ulcers Pulmonary:   [] Uses home oxygen   [] Productive cough   [] Hemoptysis   [] Wheeze  [x] COPD   [] Asthma Neurologic:  [] Dizziness   [] Seizures   [] History of stroke   [] History of TIA  [] Aphasia   [] Vissual changes   [] Weakness or numbness in arm   [] Weakness or numbness in leg Musculoskeletal:   [] Joint swelling   [x] Joint pain   [] Low back pain Hematologic:  [] Easy bruising  [] Easy bleeding   [] Hypercoagulable state   [] Anemic Gastrointestinal:  [] Diarrhea   [] Vomiting  [x] Gastroesophageal reflux/heartburn   [] Difficulty swallowing. Genitourinary:  [] Chronic kidney disease   [] Difficult urination  [] Frequent urination   [] Blood in urine Skin:  [] Rashes   [] Ulcers  Psychological:  [] History of anxiety   []  History of major depression.  Physical Examination  Vitals:   11/12/20 1124  BP: 92/63  Pulse: 89  Resp: 16  Weight: 147 lb 6.4 oz (66.9 kg)   Body mass index is 23.09 kg/m. Gen: WD/WN, NAD Head: [place]/AT, No temporalis wasting.  Ear/Nose/Throat: Hearing grossly intact, nares w/o erythema or drainage Eyes: PER, EOMI, sclera nonicteric.  Neck: Supple, no large masses.   Pulmonary:  Good air movement, no audible wheezing bilaterally, no use of accessory muscles.  Cardiac: RRR, no JVD Vascular:  Bilateral carotid bruits Vessel Right Left  Radial Palpable Palpable  Carotid Palpable Palpable  PT Trace Palpable Not Palpable  DP Not Palpable Not Palpable  Gastrointestinal: Non-distended. No guarding/no peritoneal signs.  Musculoskeletal: M/S 5/5 throughout.  No deformity or  atrophy.  Neurologic: CN 2-12 intact. Symmetrical.  Speech is fluent. Motor exam as listed above. Psychiatric: Judgment intact, Mood & affect appropriate for pt's clinical situation.  CBC Lab Results  Component Value Date  WBC 9.2 03/15/2020   HGB 13.6 03/15/2020   HCT 37.4 03/15/2020   MCV 81.3 03/15/2020   PLT 274 03/15/2020    BMET    Component Value Date/Time   NA 138 08/16/2020 0933   NA 135 11/02/2014 0609   K 5.0 08/16/2020 0933   K 4.2 11/02/2014 0609   CL 102 08/16/2020 0933   CL 107 11/02/2014 0609   CO2 28 08/16/2020 0933   CO2 23 11/02/2014 0609   GLUCOSE 121 (H) 08/16/2020 0933   GLUCOSE 108 (H) 11/02/2014 0609   BUN 20 08/16/2020 0933   BUN 20 11/02/2014 0609   CREATININE 0.98 08/16/2020 0933   CREATININE 1.01 11/09/2015 1549   CREATININE 1.01 11/09/2015 1549   CALCIUM 9.4 08/16/2020 0933   CALCIUM 7.3 (L) 11/02/2014 0609   GFRNONAA >60 01/19/2019 0357   GFRNONAA 50 (L) 11/02/2014 0609   GFRAA >60 01/19/2019 0357   GFRAA 58 (L) 11/02/2014 0609   CrCl cannot be calculated (Patient's most recent lab result is older than the maximum 21 days allowed.).  COAG Lab Results  Component Value Date   INR 0.9 10/17/2014   INR 1.1 01/19/2014   INR 0.9 12/06/2013    Radiology No results found.   Assessment/Plan 1. Atherosclerosis of native artery of both lower extremities with intermittent claudication (HCC) Recommend:  The patient has evidence of severe atherosclerotic changes of both lower extremities with mild rest pain that is associated with preulcerative changes and impending tissue loss of the left foot.  This represents a limb threatening ischemia and places the patient at the risk for left limb loss.  Patient should undergo angiography of the left lower extremities with the hope for intervention for limb salvage.  The risks and benefits as well as the alternative therapies was discussed in detail with the patient.  All questions were answered.   Patient agrees to proceed with left leg  angiography.  The patient will follow up with me in the office after the procedure.   - VAS [place] LOWER EXTREMITY ARTERIAL DUPLEX; Future  2. Bilateral carotid artery stenosis Recommend:  Given the patient's asymptomatic subcritical stenosis no further invasive testing or surgery at this time.  Duplex ultrasound shows KDXI=<33% and LICA =82-50%.  Continue antiplatelet therapy as prescribed Continue management of CAD, HTN and Hyperlipidemia Healthy heart diet,  encouraged exercise at least 4 times per week Follow up in 6 months with duplex ultrasound and physical exam   3. Primary hypertension Continue antihypertensive medications as already ordered, these medications have been reviewed and there are no changes at this time.   4. Coronary artery disease of native artery of native heart with stable angina pectoris (HCC) Continue cardiac and antihypertensive medications as already ordered and reviewed, no changes at this time.  Continue statin as ordered and reviewed, no changes at this time  Nitrates PRN for chest pain   5. Controlled type 2 DM with peripheral circulatory disorder (HCC) Continue hypoglycemic medications as already ordered, these medications have been reviewed and there are no changes at this time.  Hgb A1C to be monitored as already arranged by primary service    Hortencia Pilar, MD  11/12/2020 12:47 PM

## 2020-11-12 NOTE — H&P (View-Only) (Signed)
MRN : [id number]  Laura Reed is a 60 y.o. (03/10/1961) female who presents with chief complaint of  Chief Complaint  Patient presents with  . Follow-up    6 month ultrasound follow up  .  History of Present Illness:  The patient returns to the office for followup and review status post angiogram with intervention.  She is s/p angiogram on 05/05/2018: 1.Percutaneous transluminal angioplasty and stent placementleftpopliteal 2. Percutaneous transluminal angioplasty of the right anterior tibial artery to 3 mm  The patient notes improvement in the lower extremity symptoms. No interval shortening of the patient's claudication distance or rest pain symptoms. Previous wounds have now healed. No new ulcers or wounds have occurred since the last visit.  There have been no significant changes to the patient's overall health care.  The patient denies amaurosis fugax or recent TIA symptoms. There are no recent neurological changes noted. The patient denies history of DVT, PE or superficial thrombophlebitis. The patient denies recent episodes of angina or shortness of breath.   ABI's Rt=1.01and Lt=0.60(previous Rt=0.84and Lt=0.79) Duplex ultrasound done today of the left lower extremity shows patent bypass with occlusion of the distal popliteal  Current Meds  Medication Sig  . acetaminophen (TYLENOL) 500 MG tablet Take 1,000 mg by mouth every 6 (six) hours as needed for mild pain or headache.   . ALPRAZolam (XANAX) 0.5 MG tablet TAKE 1 TABLET(0.5 MG) BY MOUTH DAILY AS NEEDED FOR ANXIETY  . amLODipine (NORVASC) 5 MG tablet TAKE 1 TABLET(5 MG) BY MOUTH DAILY  . aspirin 81 MG chewable tablet Chew 81 mg by mouth at bedtime.   Marland Kitchen atorvastatin (LIPITOR) 20 MG tablet Take 1 tablet (20 mg total) by mouth daily.  . Cholecalciferol (VITAMIN D3) 2000 UNITS TABS Take 2,000 Units by mouth daily after supper.   . clopidogrel (PLAVIX) 75 MG tablet TAKE 1 TABLET(75 MG) BY MOUTH  DAILY  . Continuous Blood Gluc Sensor (FREESTYLE LIBRE 2 SENSOR) MISC Use to check sugar at least 4 times daily  . empagliflozin (JARDIANCE) 10 MG TABS tablet Take 1 tablet (10 mg total) by mouth daily before breakfast.  . gabapentin (NEURONTIN) 800 MG tablet Take 1 tablet (800 mg total) by mouth 3 (three) times daily.  . insulin degludec (TRESIBA FLEXTOUCH) 100 UNIT/ML FlexTouch Pen Inject 22 Units into the skin daily.  . Lancets Misc. MISC Use to check blood sugars 4 times daily  . losartan (COZAAR) 50 MG tablet TAKE 1 TABLET(50 MG) BY MOUTH DAILY  . Multiple Vitamin (MULTIVITAMIN) tablet Take 1 tablet by mouth daily.  Marland Kitchen OZEMPIC, 1 MG/DOSE, 4 MG/3ML SOPN INJECT 1 MG UNDER THE SKIN ONCE WEEKLY    Past Medical History:  Diagnosis Date  . Absence of kidney    left  . Anxiety   . Arthritis   . Bladder cancer ([place])   . CHF (congestive heart failure) ([place])   . Complication of anesthesia    BP HAS  RUN LOW AFTER SURGERY-LUNGS FILLED UP WITH FLUID AFTER  LEG STENT SURGERY   . Coronary artery disease   . Diabetes mellitus   . Family history of adverse reaction to anesthesia    Sister - PONV  . GERD (gastroesophageal reflux disease)    OCC TUMS  . Heart murmur   . Hemorrhoid   . History of methicillin resistant staphylococcus aureus (MRSA) 2007  . Hypertension   . Neuropathy   . PVD (peripheral vascular disease) ([place])   . Thyroid nodule  right  . Urothelial carcinoma of kidney ([place]) 10/31/2014   INVASIVE UROTHELIAL CARCINOMA, LOW GRADE. T1, Nx.  . Wears dentures    full upper and lower    Past Surgical History:  Procedure Laterality Date  . AMPUTATION TOE     right (4th and 5th); left (great toe, 3rd)  . AMPUTATION TOE Right 07/16/2018   Procedure: AMPUTATION TOE/MPJ right 2nd;  Surgeon: Sharlotte Alamo, DPM;  Location: [facility];  Service: Podiatry;  Laterality: Right;  . ARTERIAL BYPASS SURGRY  2009, 2013 x 2   right leg , done in [place]  . CARDIAC CATHETERIZATION    .  CAROTID ENDARTERECTOMY Right 01/2014   Dr Delana Meyer  . CATARACT EXTRACTION W/PHACO Right 12/14/2014   Procedure: CATARACT EXTRACTION PHACO AND INTRAOCULAR LENS PLACEMENT (IOC);  Surgeon: Lyla Glassing, MD;  Location: [facility];  Service: Ophthalmology;  Laterality: Right;  [place]   00:38.6              AP        7.1                   CDE  2.76  . CATARACT EXTRACTION W/PHACO Left 12/06/2019   Procedure: CATARACT EXTRACTION PHACO AND INTRAOCULAR LENS PLACEMENT ([place]) LEFT DIABETIC;  Surgeon: Birder Robson, MD;  Location: [place];  Service: Ophthalmology;  Laterality: Left;  9.08 1:06.4  . CESAREAN SECTION    . CHOLECYSTECTOMY  03-03-12   Porcelain gallbladder, gallstones,  [place]  . COLONOSCOPY W/ BIOPSIES  04/28/2012   Hyperplastic rectal polyps.  . CORONARY ARTERY BYPASS GRAFT  2009   3 vessel  . CYSTOSCOPY W/ RETROGRADES Right 09/01/2016   Procedure: CYSTOSCOPY WITH RETROGRADE PYELOGRAM;  Surgeon: Hollice Espy, MD;  Location: [facility];  Service: Urology;  Laterality: Right;  . CYSTOSCOPY W/ RETROGRADES Bilateral 03/19/2020   Procedure: CYSTOSCOPY WITH RETROGRADE PYELOGRAM;  Surgeon: Hollice Espy, MD;  Location: [facility];  Service: Urology;  Laterality: Bilateral;  . CYSTOSCOPY WITH BIOPSY N/A 03/19/2020   Procedure: CYSTOSCOPY WITH BIOPSY;  Surgeon: Hollice Espy, MD;  Location: [facility];  Service: Urology;  Laterality: N/A;  . EYE SURGERY    . HERNIA REPAIR  10-31-14   ventral, retro-rectus atrium mesh  . IRRIGATION AND DEBRIDEMENT FOOT Left 01/18/2019   Procedure: IRRIGATION AND DEBRIDEMENT FOOT;  Surgeon: Sharlotte Alamo, DPM;  Location: [facility];  Service: Podiatry;  Laterality: Left;  . LOWER EXTREMITY ANGIOGRAPHY Left 12/10/2016   Procedure: Lower Extremity Angiography;  Surgeon: Katha Cabal, MD;  Location: [facility];  Service: Cardiovascular;  Laterality: Left;  . LOWER EXTREMITY ANGIOGRAPHY Left 02/02/2018   Procedure: LOWER EXTREMITY ANGIOGRAPHY;  Surgeon:  Katha Cabal, MD;  Location: [facility] [place] [facility];  Service: Cardiovascular;  Laterality: Left;  . LOWER EXTREMITY ANGIOGRAPHY Left 05/05/2018   Procedure: LOWER EXTREMITY ANGIOGRAPHY;  Surgeon: Katha Cabal, MD;  Location: [facility];  Service: Cardiovascular;  Laterality: Left;  . NEPHRECTOMY Left 10-31-14  . PERIPHERAL VASCULAR CATHETERIZATION Left 05/01/2015   Procedure: Lower Extremity Angiography;  Surgeon: Katha Cabal, MD;  Location: [facility];  Service: Cardiovascular;  Laterality: Left;  . PERIPHERAL VASCULAR CATHETERIZATION  05/01/2015   Procedure: Lower Extremity Intervention;  Surgeon: Katha Cabal, MD;  Location: [place] [facility];  Service: Cardiovascular;;  . PERIPHERAL VASCULAR CATHETERIZATION Left 02/20/2015   Procedure: Pelvic Angiography;  Surgeon: Katha Cabal, MD;  Location: [facility];  Service: Cardiovascular;  Laterality: Left;  . TRANSURETHRAL RESECTION OF BLADDER TUMOR WITH MITOMYCIN-C N/A 09/01/2016   Procedure: TRANSURETHRAL RESECTION OF BLADDER TUMOR WITH MITOMYCIN-C;  Surgeon: Hollice Espy, MD;  Location: [facility];  Service: Urology;  Laterality: N/A;    Social History Social History   Tobacco Use  . Smoking status: Former Smoker    Packs/day: 2.00    Years: 35.00    Pack years: 70.00    Types: Cigarettes    Quit date: 03/29/2013    Years since quitting: 7.6  . Smokeless tobacco: Never Used  Vaping Use  . Vaping Use: Former  Substance Use Topics  . Alcohol use: Not Currently    Alcohol/week: 0.0 standard drinks    Comment: LAST DRINK 2009  . Drug use: Not Currently    Types: Cocaine    Comment: last used in 2007    Family History Family History  Problem Relation Age of Onset  . Cancer Mother 27       Lung Cancer  . Cancer Father 33       Lung Ca  . Diabetes Son   . Breast cancer Maternal Grandmother   . Kidney cancer Neg Hx   . Bladder Cancer Neg Hx   . Prostate cancer Neg Hx      No Known Allergies   REVIEW OF SYSTEMS (Negative unless checked)  Constitutional: [] Weight loss  [] Fever  [] Chills Cardiac: [] Chest pain   [] Chest pressure   [] Palpitations   [] Shortness of breath when laying flat   [] Shortness of breath with exertion. Vascular:  [x] Pain in legs with walking   [] Pain in legs at rest  [] History of DVT   [] Phlebitis   [] Swelling in legs   [] Varicose veins   [] Non-healing ulcers Pulmonary:   [] Uses home oxygen   [] Productive cough   [] Hemoptysis   [] Wheeze  [x] COPD   [] Asthma Neurologic:  [] Dizziness   [] Seizures   [] History of stroke   [] History of TIA  [] Aphasia   [] Vissual changes   [] Weakness or numbness in arm   [] Weakness or numbness in leg Musculoskeletal:   [] Joint swelling   [x] Joint pain   [] Low back pain Hematologic:  [] Easy bruising  [] Easy bleeding   [] Hypercoagulable state   [] Anemic Gastrointestinal:  [] Diarrhea   [] Vomiting  [x] Gastroesophageal reflux/heartburn   [] Difficulty swallowing. Genitourinary:  [] Chronic kidney disease   [] Difficult urination  [] Frequent urination   [] Blood in urine Skin:  [] Rashes   [] Ulcers  Psychological:  [] History of anxiety   []  History of major depression.  Physical Examination  Vitals:   11/12/20 1124  BP: 92/63  Pulse: 89  Resp: 16  Weight: 147 lb 6.4 oz (66.9 kg)   Body mass index is 23.09 kg/m. Gen: WD/WN, NAD Head: [place]/AT, No temporalis wasting.  Ear/Nose/Throat: Hearing grossly intact, nares w/o erythema or drainage Eyes: PER, EOMI, sclera nonicteric.  Neck: Supple, no large masses.   Pulmonary:  Good air movement, no audible wheezing bilaterally, no use of accessory muscles.  Cardiac: RRR, no JVD Vascular:  Bilateral carotid bruits Vessel Right Left  Radial Palpable Palpable  Carotid Palpable Palpable  PT Trace Palpable Not Palpable  DP Not Palpable Not Palpable  Gastrointestinal: Non-distended. No guarding/no peritoneal signs.  Musculoskeletal: M/S 5/5 throughout.  No deformity or  atrophy.  Neurologic: CN 2-12 intact. Symmetrical.  Speech is fluent. Motor exam as listed above. Psychiatric: Judgment intact, Mood & affect appropriate for pt's clinical situation.  CBC Lab Results  Component Value Date  WBC 9.2 03/15/2020   HGB 13.6 03/15/2020   HCT 37.4 03/15/2020   MCV 81.3 03/15/2020   PLT 274 03/15/2020    BMET    Component Value Date/Time   NA 138 08/16/2020 0933   NA 135 11/02/2014 0609   K 5.0 08/16/2020 0933   K 4.2 11/02/2014 0609   CL 102 08/16/2020 0933   CL 107 11/02/2014 0609   CO2 28 08/16/2020 0933   CO2 23 11/02/2014 0609   GLUCOSE 121 (H) 08/16/2020 0933   GLUCOSE 108 (H) 11/02/2014 0609   BUN 20 08/16/2020 0933   BUN 20 11/02/2014 0609   CREATININE 0.98 08/16/2020 0933   CREATININE 1.01 11/09/2015 1549   CREATININE 1.01 11/09/2015 1549   CALCIUM 9.4 08/16/2020 0933   CALCIUM 7.3 (L) 11/02/2014 0609   GFRNONAA >60 01/19/2019 0357   GFRNONAA 50 (L) 11/02/2014 0609   GFRAA >60 01/19/2019 0357   GFRAA 58 (L) 11/02/2014 0609   CrCl cannot be calculated (Patient's most recent lab result is older than the maximum 21 days allowed.).  COAG Lab Results  Component Value Date   INR 0.9 10/17/2014   INR 1.1 01/19/2014   INR 0.9 12/06/2013    Radiology No results found.   Assessment/Plan 1. Atherosclerosis of native artery of both lower extremities with intermittent claudication (HCC) Recommend:  The patient has evidence of severe atherosclerotic changes of both lower extremities with mild rest pain that is associated with preulcerative changes and impending tissue loss of the left foot.  This represents a limb threatening ischemia and places the patient at the risk for left limb loss.  Patient should undergo angiography of the left lower extremities with the hope for intervention for limb salvage.  The risks and benefits as well as the alternative therapies was discussed in detail with the patient.  All questions were answered.   Patient agrees to proceed with left leg  angiography.  The patient will follow up with me in the office after the procedure.   - VAS [place] LOWER EXTREMITY ARTERIAL DUPLEX; Future  2. Bilateral carotid artery stenosis Recommend:  Given the patient's asymptomatic subcritical stenosis no further invasive testing or surgery at this time.  Duplex ultrasound shows KDXI=<33% and LICA =82-50%.  Continue antiplatelet therapy as prescribed Continue management of CAD, HTN and Hyperlipidemia Healthy heart diet,  encouraged exercise at least 4 times per week Follow up in 6 months with duplex ultrasound and physical exam   3. Primary hypertension Continue antihypertensive medications as already ordered, these medications have been reviewed and there are no changes at this time.   4. Coronary artery disease of native artery of native heart with stable angina pectoris (HCC) Continue cardiac and antihypertensive medications as already ordered and reviewed, no changes at this time.  Continue statin as ordered and reviewed, no changes at this time  Nitrates PRN for chest pain   5. Controlled type 2 DM with peripheral circulatory disorder (HCC) Continue hypoglycemic medications as already ordered, these medications have been reviewed and there are no changes at this time.  Hgb A1C to be monitored as already arranged by primary service    Hortencia Pilar, MD  11/12/2020 12:47 PM

## 2020-11-15 ENCOUNTER — Other Ambulatory Visit: Payer: Self-pay

## 2020-11-15 ENCOUNTER — Ambulatory Visit (INDEPENDENT_AMBULATORY_CARE_PROVIDER_SITE_OTHER): Payer: Medicare Other | Admitting: Internal Medicine

## 2020-11-15 ENCOUNTER — Encounter: Payer: Self-pay | Admitting: Internal Medicine

## 2020-11-15 VITALS — BP 130/72 | HR 83 | Temp 96.8°F | Resp 15 | Ht 67.0 in | Wt 147.6 lb

## 2020-11-15 DIAGNOSIS — E1151 Type 2 diabetes mellitus with diabetic peripheral angiopathy without gangrene: Secondary | ICD-10-CM

## 2020-11-15 DIAGNOSIS — E1121 Type 2 diabetes mellitus with diabetic nephropathy: Secondary | ICD-10-CM | POA: Diagnosis not present

## 2020-11-15 DIAGNOSIS — Z794 Long term (current) use of insulin: Secondary | ICD-10-CM | POA: Diagnosis not present

## 2020-11-15 DIAGNOSIS — I10 Essential (primary) hypertension: Secondary | ICD-10-CM

## 2020-11-15 DIAGNOSIS — E1165 Type 2 diabetes mellitus with hyperglycemia: Secondary | ICD-10-CM | POA: Diagnosis not present

## 2020-11-15 LAB — MICROALBUMIN / CREATININE URINE RATIO
Creatinine,U: 10.7 mg/dL
Microalb Creat Ratio: 18.3 mg/g (ref 0.0–30.0)
Microalb, Ur: 2 mg/dL — ABNORMAL HIGH (ref 0.0–1.9)

## 2020-11-15 LAB — COMPREHENSIVE METABOLIC PANEL
ALT: 19 U/L (ref 0–35)
AST: 20 U/L (ref 0–37)
Albumin: 4.3 g/dL (ref 3.5–5.2)
Alkaline Phosphatase: 86 U/L (ref 39–117)
BUN: 22 mg/dL (ref 6–23)
CO2: 30 mEq/L (ref 19–32)
Calcium: 9.7 mg/dL (ref 8.4–10.5)
Chloride: 100 mEq/L (ref 96–112)
Creatinine, Ser: 0.89 mg/dL (ref 0.40–1.20)
GFR: 69.61 mL/min (ref >60.00)
Glucose, Bld: 147 mg/dL — ABNORMAL HIGH (ref 70–99)
Potassium: 4.3 mEq/L (ref 3.5–5.1)
Sodium: 137 mEq/L (ref 135–145)
Total Bilirubin: 0.5 mg/dL (ref 0.2–1.2)
Total Protein: 7.1 g/dL (ref 6.0–8.3)

## 2020-11-15 LAB — HEMOGLOBIN A1C: Hgb A1c MFr Bld: 7.6 % — ABNORMAL HIGH (ref 4.6–6.5)

## 2020-11-15 LAB — LIPID PANEL
Cholesterol: 114 mg/dL (ref 0–200)
HDL: 55.3 mg/dL (ref >39.00)
LDL Cholesterol: 42 mg/dL (ref 0–99)
NonHDL: 58.41
Total CHOL/HDL Ratio: 2
Triglycerides: 84 mg/dL (ref 0.0–149.0)
VLDL: 16.8 mg/dL (ref 0.0–40.0)

## 2020-11-15 NOTE — Patient Instructions (Signed)
You do not need to lose any more weight.  The Ozempic is partly responsible.  Start  adding a premier  protein shake for a snack to go with your Atkins bar   If you make a substitute for a higher calorie shake,  Make sure the carbs are < 20

## 2020-11-15 NOTE — Progress Notes (Signed)
Subjective:  Patient ID: Laura Reed, female    DOB: August 30, 1960  Age: 60 y.o. MRN: [id number]  CC: The primary encounter diagnosis was Diabetic nephropathy with proteinuria ([place]). Diagnoses of Controlled type 2 DM with peripheral circulatory disorder ([place]), Primary hypertension, and Type 2 diabetes mellitus with hyperglycemia, with long-term current use of insulin ([place]) were also pertinent to this visit.  HPI Laura Reed presents for follow up on type 2 DM  This visit occurred during the SARS-CoV-2 public health emergency.  Safety protocols were in place, including screening questions prior to the visit, additional usage of staff PPE, and extensive cleaning of exam room while observing appropriate contact time as indicated for disinfecting solutions.    T2DM:  She  feels generally well,  But is not  exercising regularly or trying to lose weight. Using the CBG to check blood sugars 5 to 6 times daily.  Sugars are in range 50% of the time with a projected a1c of 69%   Denies any recent hypoglyemic events.  Taking   medications as directed. Following a carbohydrate modified diet 6 days per week. Denies numbness, burning and tingling of extremities. Appetite is good.   Stressed about his son's choice in partners.  He moved out recently.    Unintentional Weight loss of 12 ls in 6 months .  Taking ozempic.  Reviewed diet:  Peanut butter toast w or w/o banana (underripe).  Lunch is a sandwhich ,  Small bag of chips,  Carrots,   Atkins bar snack.    Dinner: at 8:30 PM is a meat ,  A starch,  (mac/cheese or potato)  And a salad   Receiving injections by [facility]  Monthly for macular degeneration.    Outpatient Medications Prior to Visit  Medication Sig Dispense Refill  . ACCU-CHEK AVIVA PLUS test strip     . acetaminophen (TYLENOL) 500 MG tablet Take 1,000 mg by mouth every 6 (six) hours as needed for mild pain or headache.     . ALPRAZolam (XANAX) 0.5 MG tablet TAKE 1 TABLET(0.5 MG) BY MOUTH  DAILY AS NEEDED FOR ANXIETY 30 tablet 5  . amLODipine (NORVASC) 5 MG tablet TAKE 1 TABLET(5 MG) BY MOUTH DAILY 90 tablet 1  . aspirin 81 MG chewable tablet Chew 81 mg by mouth at bedtime.     Marland Kitchen atorvastatin (LIPITOR) 20 MG tablet Take 1 tablet (20 mg total) by mouth daily. 90 tablet 1  . Cholecalciferol (VITAMIN D3) 2000 UNITS TABS Take 2,000 Units by mouth daily after supper.     . clopidogrel (PLAVIX) 75 MG tablet TAKE 1 TABLET(75 MG) BY MOUTH DAILY 90 tablet 1  . Continuous Blood Gluc Sensor (FREESTYLE LIBRE 2 SENSOR) MISC Use to check sugar at least 4 times daily 2 each 2  . gabapentin (NEURONTIN) 800 MG tablet Take 1 tablet (800 mg total) by mouth 3 (three) times daily. 270 tablet 3  . insulin lispro (HUMALOG) 100 UNIT/ML KwikPen INJECT 10 UNITS UNDER THE SKIN THREE TIMES DAILY BEFORE MEALS    . Insulin Pen Needle (PEN NEEDLES) 32G X 4 MM MISC Use to take insulin daily 100 each 3  . Lancets Misc. MISC Use to check blood sugars 4 times daily 400 each 3  . losartan (COZAAR) 50 MG tablet TAKE 1 TABLET(50 MG) BY MOUTH DAILY 90 tablet 1  . Multiple Vitamin (MULTIVITAMIN) tablet Take 1 tablet by mouth daily.    Marland Kitchen OZEMPIC, 1 MG/DOSE, 4 MG/3ML SOPN INJECT 1  MG UNDER THE SKIN ONCE WEEKLY 3 mL 2  . empagliflozin (JARDIANCE) 10 MG TABS tablet Take 1 tablet (10 mg total) by mouth daily before breakfast. 90 tablet 1  . insulin degludec (TRESIBA FLEXTOUCH) 100 UNIT/ML FlexTouch Pen Inject 22 Units into the skin daily. 21 mL 3   No facility-administered medications prior to visit.    Review of Systems;  Patient denies headache, fevers, malaise, unintentional weight loss, skin rash, eye pain, sinus congestion and sinus pain, sore throat, dysphagia,  hemoptysis , cough, dyspnea, wheezing, chest pain, palpitations, orthopnea, edema, abdominal pain, nausea, melena, diarrhea, constipation, flank pain, dysuria, hematuria, urinary  Frequency, nocturia, numbness, tingling, seizures,  Focal weakness, Loss of  consciousness,  Tremor, insomnia, depression, anxiety, and suicidal ideation.      Objective:  BP 130/72 (BP Location: Left Arm, Patient Position: Sitting, Cuff Size: Normal)   Pulse 83   Temp (!) 96.8 F (36 C) (Temporal)   Resp 15   Ht _0  (1.702 m)   Wt 147 lb 9.6 oz (67 kg)   SpO2 95%   BMI 23.12 kg/m   BP Readings from Last 3 Encounters:  11/15/20 130/72  11/12/20 92/63  09/19/20 (!) 150/69    Wt Readings from Last 3 Encounters:  11/15/20 147 lb 9.6 oz (67 kg)  11/12/20 147 lb 6.4 oz (66.9 kg)  09/19/20 149 lb (67.6 kg)    General appearance: alert, cooperative and appears stated age Ears: normal TM's and external ear canals both ears Throat: lips, mucosa, and tongue normal; teeth and gums normal Neck: no adenopathy, no carotid bruit, supple, symmetrical, trachea midline and thyroid not enlarged, symmetric, no tenderness/mass/nodules Back: symmetric, no curvature. ROM normal. No CVA tenderness. Lungs: clear to auscultation bilaterally Heart: regular rate and rhythm, S1, S2 normal, no murmur, click, rub or gallop Abdomen: soft, non-tender; bowel sounds normal; no masses,  no organomegaly Pulses: 2+ and symmetric Skin: Skin color, texture, turgor normal. No rashes or lesions Lymph nodes: Cervical, supraclavicular, and axillary nodes normal.  Lab Results  Component Value Date   HGBA1C 7.6 (H) 11/15/2020   HGBA1C 7.4 (H) 08/16/2020   HGBA1C 8.5 (H) 05/09/2020    Lab Results  Component Value Date   CREATININE 0.89 11/15/2020   CREATININE 0.98 08/16/2020   CREATININE 0.89 05/09/2020    Lab Results  Component Value Date   WBC 9.2 03/15/2020   HGB 13.6 03/15/2020   HCT 37.4 03/15/2020   PLT 274 03/15/2020   GLUCOSE 147 (H) 11/15/2020   CHOL 114 11/15/2020   TRIG 84.0 11/15/2020   HDL 55.30 11/15/2020   LDLDIRECT 47.0 01/28/2017   LDLCALC 42 11/15/2020   ALT 19 11/15/2020   AST 20 11/15/2020   NA 137 11/15/2020   K 4.3 11/15/2020   CL 100 11/15/2020    CREATININE 0.89 11/15/2020   BUN 22 11/15/2020   CO2 30 11/15/2020   TSH 0.96 05/18/2015   INR 0.9 10/17/2014   HGBA1C 7.6 (H) 11/15/2020   MICROALBUR 2.0 (H) 11/15/2020    MM 3D SCREEN BREAST BILATERAL  Result Date: 05/23/2020 CLINICAL DATA:  Screening. EXAM: DIGITAL SCREENING BILATERAL MAMMOGRAM WITH TOMO AND CAD COMPARISON:  Previous exam(s). ACR Breast Density Category b: There are scattered areas of fibroglandular density. FINDINGS: There are no findings suspicious for malignancy. Images were processed with CAD. IMPRESSION: No mammographic evidence of malignancy. A result letter of this screening mammogram will be mailed directly to the patient. RECOMMENDATION: Screening mammogram in one year. (Code:SM-B-01Y)  BI-RADS CATEGORY  1: Negative. Electronically Signed   By: Lillia Mountain M.D.   On: 05/23/2020 14:47    Assessment & Plan:   Problem List Items Addressed This Visit      Unprioritized   Controlled type 2 DM with peripheral circulatory disorder (HCC)    Mild loss of control using Tresiba at 22 units, Ozempic  1 mg weekly , Jardiance 10 mg daily and a sliding scale insulin regimen given manage normoglycemia per meal.   Patient has mild microalbuminuria. Patient is tolerating statin therapy for CAD risk reduction and on ACE/ARB for renal protection and hypertension.  Will increase Tresiba to 25 units and Jardiance to 25 mg   Lab Results  Component Value Date   HGBA1C 7.6 (H) 11/15/2020     Lab Results  Component Value Date   LABMICR See below: 09/19/2020   LABMICR See below: 03/09/2020   MICROALBUR 2.0 (H) 11/15/2020   MICROALBUR 18.0 (H) 05/26/2019           Relevant Medications   empagliflozin (JARDIANCE) 25 MG TABS tablet   insulin degludec (TRESIBA FLEXTOUCH) 100 UNIT/ML FlexTouch Pen   Diabetic nephropathy with proteinuria (HCC) - Primary   Relevant Medications   empagliflozin (JARDIANCE) 25 MG TABS tablet   insulin degludec (TRESIBA FLEXTOUCH) 100 UNIT/ML  FlexTouch Pen   Other Relevant Orders   Hemoglobin A1c (Completed)   Comprehensive metabolic panel (Completed)   Lipid panel (Completed)   Microalbumin / creatinine urine ratio (Completed)   Hypertension    Well controlled on current regimen of amlodipine and losartan . Renal function stable, no changes today.  Lab Results  Component Value Date   CREATININE 0.89 11/15/2020   Lab Results  Component Value Date   NA 137 11/15/2020   K 4.3 11/15/2020   CL 100 11/15/2020   CO2 30 11/15/2020          Other Visit Diagnoses    Type 2 diabetes mellitus with hyperglycemia, with long-term current use of insulin (HCC)       Relevant Medications   empagliflozin (JARDIANCE) 25 MG TABS tablet   insulin degludec (TRESIBA FLEXTOUCH) 100 UNIT/ML FlexTouch Pen      I have changed Laura Reed's empagliflozin and Tresiba FlexTouch. I am also having her maintain her Vitamin D3, aspirin, acetaminophen, Lancets Misc., atorvastatin, Pen Needles, FreeStyle Libre 2 Sensor, ALPRAZolam, clopidogrel, gabapentin, amLODipine, Ozempic (1 MG/DOSE), insulin lispro, multivitamin, losartan, and Accu-Chek Aviva Plus.  Meds ordered this encounter  Medications  . empagliflozin (JARDIANCE) 25 MG TABS tablet    Sig: Take 1 tablet (25 mg total) by mouth daily before breakfast.    Dispense:  30 tablet    Refill:  2  . insulin degludec (TRESIBA FLEXTOUCH) 100 UNIT/ML FlexTouch Pen    Sig: Inject 25 Units into the skin daily.    Dispense:  21 mL    Refill:  3    HOLD until patient requests.    Medications Discontinued During This Encounter  Medication Reason  . empagliflozin (JARDIANCE) 10 MG TABS tablet   . insulin degludec (TRESIBA FLEXTOUCH) 100 UNIT/ML FlexTouch Pen     Follow-up: Return in about 3 months (around 02/15/2021) for follow up diabetes.   [name], MD

## 2020-11-17 MED ORDER — TRESIBA FLEXTOUCH 100 UNIT/ML ~~LOC~~ SOPN: 25.0000 [IU] | PEN_INJECTOR | Freq: Every day | SUBCUTANEOUS | 3 refills | Status: DC

## 2020-11-17 MED ORDER — EMPAGLIFLOZIN 25 MG PO TABS
25.0000 mg | ORAL_TABLET | Freq: Every day | ORAL | 2 refills | Status: DC
Start: 2020-11-17 — End: 2021-02-26

## 2020-11-17 NOTE — Assessment & Plan Note (Signed)
Well controlled on current regimen of amlodipine and losartan . Renal function stable, no changes today.  Lab Results  Component Value Date   CREATININE 0.89 11/15/2020   Lab Results  Component Value Date   NA 137 11/15/2020   K 4.3 11/15/2020   CL 100 11/15/2020   CO2 30 11/15/2020

## 2020-11-17 NOTE — Assessment & Plan Note (Addendum)
Mild loss of control using Tresiba at 22 units, Ozempic  1 mg weekly , Jardiance 10 mg daily and a sliding scale insulin regimen given manage normoglycemia per meal.   Patient has mild microalbuminuria. Patient is tolerating statin therapy for CAD risk reduction and on ACE/ARB for renal protection and hypertension.  Will increase Tresiba to 25 units and Jardiance to 25 mg   Lab Results  Component Value Date   HGBA1C 7.6 (H) 11/15/2020     Lab Results  Component Value Date   LABMICR See below: 09/19/2020   LABMICR See below: 03/09/2020   MICROALBUR 2.0 (H) 11/15/2020   MICROALBUR 18.0 (H) 05/26/2019

## 2020-11-19 ENCOUNTER — Telehealth (INDEPENDENT_AMBULATORY_CARE_PROVIDER_SITE_OTHER): Payer: Self-pay

## 2020-11-19 NOTE — Telephone Encounter (Signed)
Spoke with the patient and she is scheduled with Dr. Delana Meyer for a left leg angio with intervention on 12/04/20 with a 9:00 am arrival time to the [facility]. Pre-procedure instructions were discussed and will be mailed.

## 2020-11-20 ENCOUNTER — Telehealth: Payer: Self-pay | Admitting: *Deleted

## 2020-11-20 NOTE — Telephone Encounter (Signed)
Left message for patient to call back concerning labs.

## 2020-11-22 DIAGNOSIS — Z85528 Personal history of other malignant neoplasm of kidney: Secondary | ICD-10-CM | POA: Diagnosis not present

## 2020-11-22 DIAGNOSIS — I25810 Atherosclerosis of coronary artery bypass graft(s) without angina pectoris: Secondary | ICD-10-CM | POA: Diagnosis not present

## 2020-11-22 DIAGNOSIS — I70219 Atherosclerosis of native arteries of extremities with intermittent claudication, unspecified extremity: Secondary | ICD-10-CM | POA: Diagnosis not present

## 2020-11-22 DIAGNOSIS — I739 Peripheral vascular disease, unspecified: Secondary | ICD-10-CM | POA: Diagnosis not present

## 2020-11-22 DIAGNOSIS — I70299 Other atherosclerosis of native arteries of extremities, unspecified extremity: Secondary | ICD-10-CM | POA: Diagnosis not present

## 2020-11-22 DIAGNOSIS — L97909 Non-pressure chronic ulcer of unspecified part of unspecified lower leg with unspecified severity: Secondary | ICD-10-CM | POA: Diagnosis not present

## 2020-11-23 NOTE — Telephone Encounter (Signed)
Patient verified understanding with repeat of correct dosing.

## 2020-11-23 NOTE — Telephone Encounter (Signed)
Patient returned Kathy's phone call for labs.

## 2020-12-04 ENCOUNTER — Encounter: Payer: Self-pay | Admitting: Vascular Surgery

## 2020-12-04 ENCOUNTER — Other Ambulatory Visit: Payer: Self-pay

## 2020-12-04 ENCOUNTER — Ambulatory Visit
Admission: RE | Admit: 2020-12-04 | Discharge: 2020-12-04 | Disposition: A | Discharge status: Home / Self Care | Payer: Medicare Other | Attending: Vascular Surgery | Admitting: Vascular Surgery

## 2020-12-04 ENCOUNTER — Encounter
Admission: RE | Disposition: A | Discharge status: Home / Self Care | Payer: Self-pay | Source: Home / Self Care | Attending: Vascular Surgery

## 2020-12-04 ENCOUNTER — Other Ambulatory Visit (INDEPENDENT_AMBULATORY_CARE_PROVIDER_SITE_OTHER): Payer: Self-pay | Admitting: Nurse Practitioner

## 2020-12-04 DIAGNOSIS — I25118 Atherosclerotic heart disease of native coronary artery with other forms of angina pectoris: Secondary | ICD-10-CM | POA: Insufficient documentation

## 2020-12-04 DIAGNOSIS — E785 Hyperlipidemia, unspecified: Secondary | ICD-10-CM | POA: Insufficient documentation

## 2020-12-04 DIAGNOSIS — I70222 Atherosclerosis of native arteries of extremities with rest pain, left leg: Secondary | ICD-10-CM | POA: Diagnosis not present

## 2020-12-04 DIAGNOSIS — Z7902 Long term (current) use of antithrombotics/antiplatelets: Secondary | ICD-10-CM | POA: Insufficient documentation

## 2020-12-04 DIAGNOSIS — I70219 Atherosclerosis of native arteries of extremities with intermittent claudication, unspecified extremity: Secondary | ICD-10-CM

## 2020-12-04 DIAGNOSIS — I6523 Occlusion and stenosis of bilateral carotid arteries: Secondary | ICD-10-CM | POA: Insufficient documentation

## 2020-12-04 DIAGNOSIS — Z79899 Other long term (current) drug therapy: Secondary | ICD-10-CM | POA: Insufficient documentation

## 2020-12-04 DIAGNOSIS — Z7984 Long term (current) use of oral hypoglycemic drugs: Secondary | ICD-10-CM | POA: Diagnosis not present

## 2020-12-04 DIAGNOSIS — E1151 Type 2 diabetes mellitus with diabetic peripheral angiopathy without gangrene: Secondary | ICD-10-CM | POA: Diagnosis not present

## 2020-12-04 DIAGNOSIS — Z87891 Personal history of nicotine dependence: Secondary | ICD-10-CM | POA: Insufficient documentation

## 2020-12-04 DIAGNOSIS — I10 Essential (primary) hypertension: Secondary | ICD-10-CM | POA: Insufficient documentation

## 2020-12-04 DIAGNOSIS — Z7982 Long term (current) use of aspirin: Secondary | ICD-10-CM | POA: Insufficient documentation

## 2020-12-04 DIAGNOSIS — Z794 Long term (current) use of insulin: Secondary | ICD-10-CM | POA: Diagnosis not present

## 2020-12-04 HISTORY — PX: LOWER EXTREMITY ANGIOGRAPHY: CATH118251

## 2020-12-04 LAB — CREATININE, SERUM
Creatinine, Ser: 0.99 mg/dL (ref 0.44–1.00)
GFR, Estimated: >60 mL/min (ref >60)

## 2020-12-04 LAB — GLUCOSE, CAPILLARY
Glucose-Capillary: 146 mg/dL — ABNORMAL HIGH (ref 70–99)
Glucose-Capillary: 69 mg/dL — ABNORMAL LOW (ref 70–99)

## 2020-12-04 LAB — BUN: BUN: 22 mg/dL (ref 8–23)

## 2020-12-04 SURGERY — LOWER EXTREMITY ANGIOGRAPHY
Anesthesia: Moderate Sedation | Site: Leg Lower | Laterality: Left

## 2020-12-04 MED ORDER — FAMOTIDINE 20 MG PO TABS: 40.0000 mg | ORAL_TABLET | Freq: Once | ORAL | Status: DC | PRN

## 2020-12-04 MED ORDER — CEFAZOLIN SODIUM-DEXTROSE 2-4 GM/100ML-% IV SOLN
INTRAVENOUS | Status: AC
  Administered 2020-12-04: 2 g via INTRAVENOUS
  Filled 2020-12-04: qty 100

## 2020-12-04 MED ORDER — SODIUM CHLORIDE 0.9% FLUSH: 3.0000 mL | INTRAVENOUS | Status: DC | PRN

## 2020-12-04 MED ORDER — ACETAMINOPHEN 325 MG PO TABS: 650.0000 mg | ORAL_TABLET | ORAL | Status: DC | PRN

## 2020-12-04 MED ORDER — ONDANSETRON HCL 4 MG/2ML IJ SOLN: 4.0000 mg | Freq: Four times a day (QID) | INTRAMUSCULAR | Status: DC | PRN

## 2020-12-04 MED ORDER — SODIUM CHLORIDE 0.9 % IV SOLN: INTRAVENOUS | Status: DC

## 2020-12-04 MED ORDER — LABETALOL HCL 5 MG/ML IV SOLN: 10.0000 mg | INTRAVENOUS | Status: DC | PRN

## 2020-12-04 MED ORDER — HEPARIN SODIUM (PORCINE) 1000 UNIT/ML IJ SOLN
INTRAMUSCULAR | Status: DC | PRN
  Administered 2020-12-04: 4000 [IU] via INTRAVENOUS

## 2020-12-04 MED ORDER — HYDRALAZINE HCL 20 MG/ML IJ SOLN: 5.0000 mg | INTRAMUSCULAR | Status: DC | PRN

## 2020-12-04 MED ORDER — DIPHENHYDRAMINE HCL 50 MG/ML IJ SOLN: 50.0000 mg | Freq: Once | INTRAMUSCULAR | Status: DC | PRN

## 2020-12-04 MED ORDER — FENTANYL CITRATE (PF) 100 MCG/2ML IJ SOLN
INTRAMUSCULAR | Status: AC
  Filled 2020-12-04: qty 2

## 2020-12-04 MED ORDER — HEPARIN SODIUM (PORCINE) 1000 UNIT/ML IJ SOLN
INTRAMUSCULAR | Status: AC
  Filled 2020-12-04: qty 1

## 2020-12-04 MED ORDER — MIDAZOLAM HCL 2 MG/2ML IJ SOLN
INTRAMUSCULAR | Status: DC | PRN
Start: 2020-12-04 — End: 2020-12-04
  Administered 2020-12-04: 2 mg via INTRAVENOUS
  Administered 2020-12-04 (×3): 0.5 mg via INTRAVENOUS
  Administered 2020-12-04: 1 mg via INTRAVENOUS

## 2020-12-04 MED ORDER — FENTANYL CITRATE (PF) 100 MCG/2ML IJ SOLN
INTRAMUSCULAR | Status: DC | PRN
  Administered 2020-12-04: 25 ug via INTRAVENOUS
  Administered 2020-12-04: 12.5 ug via INTRAVENOUS
  Administered 2020-12-04: 50 ug via INTRAVENOUS
  Administered 2020-12-04 (×2): 12.5 ug via INTRAVENOUS

## 2020-12-04 MED ORDER — CEFAZOLIN SODIUM-DEXTROSE 2-4 GM/100ML-% IV SOLN: 2.0000 g | Freq: Once | INTRAVENOUS | Status: AC

## 2020-12-04 MED ORDER — SODIUM CHLORIDE 0.9 % IV SOLN: 250.0000 mL | INTRAVENOUS | Status: DC | PRN

## 2020-12-04 MED ORDER — SODIUM CHLORIDE 0.9% FLUSH: 3.0000 mL | Freq: Two times a day (BID) | INTRAVENOUS | Status: DC

## 2020-12-04 MED ORDER — IODIXANOL 320 MG/ML IV SOLN
INTRAVENOUS | Status: DC | PRN
  Administered 2020-12-04: 40 mL via INTRA_ARTERIAL

## 2020-12-04 MED ORDER — HYDROMORPHONE HCL 1 MG/ML IJ SOLN: 1.0000 mg | Freq: Once | INTRAMUSCULAR | Status: DC | PRN

## 2020-12-04 MED ORDER — MIDAZOLAM HCL 5 MG/5ML IJ SOLN
INTRAMUSCULAR | Status: AC
  Filled 2020-12-04: qty 5

## 2020-12-04 MED ORDER — METHYLPREDNISOLONE SODIUM SUCC 125 MG IJ SOLR: 125.0000 mg | Freq: Once | INTRAMUSCULAR | Status: DC | PRN

## 2020-12-04 MED ORDER — MIDAZOLAM HCL 2 MG/ML PO SYRP: 8.0000 mg | ORAL_SOLUTION | Freq: Once | ORAL | Status: DC | PRN

## 2020-12-04 MED ORDER — MORPHINE SULFATE (PF) 4 MG/ML IV SOLN: 2.0000 mg | INTRAVENOUS | Status: DC | PRN

## 2020-12-04 MED ORDER — OXYCODONE HCL 5 MG PO TABS: 5.0000 mg | ORAL_TABLET | ORAL | Status: DC | PRN

## 2020-12-04 SURGICAL SUPPLY — 31 items
BALLN LUTONIX 018 4X150X130 (BALLOONS) ×2
BALLN LUTONIX 018 5X150X130 (BALLOONS) ×2
BALLN LUTONIX 018 6X60X130 (BALLOONS) ×2
BALLN ULTRASCOR 014 2.5X40X150 (BALLOONS) ×2
BALLOON LUTONIX 018 4X150X130 (BALLOONS) ×1 IMPLANT
BALLOON LUTONIX 018 5X150X130 (BALLOONS) ×1 IMPLANT
BALLOON LUTONIX 018 6X60X130 (BALLOONS) ×1 IMPLANT
BALLOON ULTRSCR 014 2.5X40X150 (BALLOONS) ×1 IMPLANT
CANISTER PENUMBRA ENGINE (MISCELLANEOUS) ×2 IMPLANT
CANNULA 5F STIFF (CANNULA) ×2 IMPLANT
CATH ANGIO 5F PIGTAIL 65CM (CATHETERS) ×2 IMPLANT
CATH BEACON 5 .035 65 KMP TIP (CATHETERS) ×2 IMPLANT
CATH INDIGO CAT6 KIT (CATHETERS) ×2 IMPLANT
CATH ROTAREX 135 6FR (CATHETERS) ×2 IMPLANT
COVER DRAPE FLUORO 36X44 (DRAPES) ×2 IMPLANT
COVER PROBE U/S 5X48 (MISCELLANEOUS) ×2 IMPLANT
DEVICE CLOSURE MYNXGRIP 6/7F (Vascular Products) ×2 IMPLANT
DEVICE TORQUE (MISCELLANEOUS) ×2 IMPLANT
DRAPE BRACHIAL (DRAPES) ×2 IMPLANT
GLIDEWIRE ADV .014X300CM (WIRE) ×2 IMPLANT
GLIDEWIRE STIFF .35X180X3 HYDR (WIRE) ×2 IMPLANT
GUIDEWIRE PFTE-COATED .018X300 (WIRE) ×2 IMPLANT
KIT ENCORE 26 ADVANTAGE (KITS) ×2 IMPLANT
PACK ANGIOGRAPHY (CUSTOM PROCEDURE TRAY) ×2 IMPLANT
SHEATH BRITE TIP 5FRX11 (SHEATH) ×2 IMPLANT
SHEATH BRITE TIP 6FRX11 (SHEATH) ×2 IMPLANT
STENT LIFESTENT 5F 7X60X135 (Permanent Stent) ×2 IMPLANT
SYR MEDRAD MARK 7 150ML (SYRINGE) ×2 IMPLANT
TUBING CONTRAST HIGH PRESS 48 (TUBING) ×4 IMPLANT
WIRE G V18X300CM (WIRE) ×2 IMPLANT
WIRE GUIDERIGHT .035X150 (WIRE) ×2 IMPLANT

## 2020-12-04 NOTE — Interval H&P Note (Signed)
History and Physical Interval Note:  12/04/2020 10:17 AM  Laura Reed  has presented today for surgery, with the diagnosis of LLE Angiography with intervention   ASO with claudication   BARD Rep cc: Judi Cong.  The various methods of treatment have been discussed with the patient and family. After consideration of risks, benefits and other options for treatment, the patient has consented to  Procedure(s): LOWER EXTREMITY ANGIOGRAPHY with Intervention (Left) as a surgical intervention.  The patient's history has been reviewed, patient examined, no change in status, stable for surgery.  I have reviewed the patient's chart and labs.  Questions were answered to the patient's satisfaction.     Laura Reed

## 2020-12-04 NOTE — Op Note (Signed)
[facility] Percutaneous Study/Intervention Procedural Note   Date of Surgery: 12/04/2020  Surgeon: Hortencia Pilar  Pre-operative Diagnosis: Atherosclerotic occlusive disease bilateral lower extremities with rest pain of the left lower extremity  Post-operative diagnosis: Same  Procedure(s) Performed: 1. Introduction catheter into left lower extremity 3rd order catheter placement  2. Contrast injection left lower extremity for distal runoff  3. Percutaneous transluminal angioplasty and stent placement left popliteal arteries to 6 mm 4. Percutaneous transluminal angioplasty left anterior tibial to 2.5 mm  5. Mechanical thrombectomy using the [place] Rex catheter left popliteal artery.             6.  Mechanical thrombectomy using the penumbra CAT 6 device left anterior tibial artery.             7.  Mynx closure left femoral-popliteal bypass graft  Anesthesia: Conscious sedation was administered under my direct supervision by the interventional radiology RN. IV Versed plus fentanyl were utilized. Continuous ECG, pulse oximetry and blood pressure was monitored throughout the entire procedure.  Conscious sedation was for a total of 1 hour 16 minutes and 18 seconds.  Sheath: 6 French 11 cm Pinnacle antegrade left femoral to popliteal bypass graft  Contrast: 40 cc  Fluoroscopy Time: 7.7 minutes  Indications: Laura Reed presents with increasing pain of the left lower extremity.  Noninvasive studies demonstrated significant deterioration in her ABI as well as probable occlusion of the distal popliteal below the level of the bypass graft.  This suggests the patient is having limb threatening ischemia. The risks and benefits are reviewed all questions answered patient agrees to proceed.  Procedure:Laura Reed is a 60 y.o. y.o. female who was identified and appropriate procedural time out was  performed. The patient was then placed supine on the table and prepped and draped in the usual sterile fashion.   Ultrasound was placed in the sterile sleeve and the left groin was evaluated the left femoral-popliteal bypass graft was identified it was echolucent and pulsatile indicating patency. Image was recorded for the permanent record and under real-time visualization a microneedle was inserted into the femoral-popliteal bypass graft followed by the microwire and then the micro-sheath. A J-wire was then advanced through the micro-sheath and a 5 [place] sheath was then inserted over a J-wire.  Hand-injection contrast through the sheath was then used to create imaging of the left lower extremity distal runoff.  Diagnostic interpretation: The visualized portions of the bypass graft were widely patent.  The distal anastomosis is patent and there is retrograde filling of the popliteal in association with extensive collaterals extending around the knee and reconstituting the most distal popliteal artery.  There is two-vessel runoff via the tibioperoneal trunk and peroneal as well as via the anterior tibial.  The segment of popliteal artery from the bypass to the reconstitution site is occluded.  Anterior tibial demonstrates a greater than 70% stenosis just beyond its origin but otherwise is patent down to the foot.  The peroneal is patent throughout its course.  Based on these findings I elected to move forward with intervention for limb salvage  4000 units of heparin was then given and allowed to circulate and a 6 French 11 cm Pinnacle sheath was advanced into the bypass graft.  A advantage wire and Kumpe catheter were then negotiated across the occlusion down to the distal reconstitution point at which point hand-injection contrast through the Kumpe catheter verified intraluminal position.  V 18 catheter was then advanced through the  Kumpe and the Kumpe removed.  The 6 [place] Rex catheter was  then prepped on the field and advanced over the wire.  A total of 6 passes were made beginning at the level of the bypass and extending down to the distal popliteal.  Follow-up angiography demonstrated there was now thrombus within the origin of the anterior tibial.  Pretty much all the thrombus within the popliteal artery had been removed.  A 4 mm x 150 mm Lutonix drug-eluting balloon was stenting advanced from the distal bypass down to the distal popliteal inflation was for 10 atm for 1 minute.  Follow-up imaging demonstrated excellent result in the distal popliteal however there was greater than 40% residual stenosis at the distal margin of the previously placed stent there was also greater than 40% stenosis within the midportion of the stent and at the anastomosis extending distally there was greater than 70% stenosis.  Next a 5 mm x 100 mm balloon was utilized this improve the distal stent stenosis which was now less than 20% the mid stent stenosis was improved but remained greater than 30% Little change was noted at the distal anastomosis.  Based on these findings I elected to place a stent extending from the distal anastomosis down into the previously placed stent.  I utilized a 7 mm x 60 mm stent and postdilated this with a 6 mm x 60 Lutonix drug-eluting balloon 2 separate inflations were created the first 2 6 atm then the balloon was repositioned slightly and the second inflation was to 14 atm.  Both inflations were for 1 minute.  Attention was then turned to the anterior tibial artery.  The V 18 wire was exchanged for a 0.014 advantage wire and the wire and catheter negotiated into the anterior tibial.  Next the penumbra CAT 6 device was advanced over the wire and mechanical expiration was performed extracting the thrombus from within the anterior tibial.  Essentially all the thrombus was removed uncovering the 70%.  A 2-1/2 mm x 40 mm ultra score balloon was then used to treat the anterior tibial lesion  inflation was to 12 atm for 1 minute.  Follow-up imaging demonstrated less than 20% residual stenosis.  After review of these images the sheath is utilized to place a minx which is successfully deployed. There no immediate Complications.  Findings:  The visualized portions of the bypass graft were widely patent.  The distal anastomosis is patent and there is retrograde filling of the popliteal in association with extensive collaterals extending around the knee and reconstituting the most distal popliteal artery.  There is two-vessel runoff via the tibioperoneal trunk and peroneal as well as via the anterior tibial.  The segment of popliteal artery from the bypass to the reconstitution site is occluded.  Anterior tibial demonstrates a greater than 70% stenosis just beyond its origin but otherwise is patent down to the foot.  The peroneal is patent throughout its course.  As noted above following [place] Rex the thrombus is extracted from the popliteal artery.  Following angioplasty and stent placement there is now wide patency of the popliteal artery down to the anterior tibial with less than 20% residual stenosis.  Following thrombectomy of the anterior tibial there is removal of all the thrombus.  Following angioplasty to 2.5 mm there is now wide patency with less than 20% residual stenosis.   Summary: Successful recanalization left lower extremity for limb salvage   Disposition: Patient was taken to the recovery room in stable condition  having tolerated the procedure well.  Belenda Cruise Trystan Akhtar 12/04/2020,12:38 PM

## 2020-12-05 ENCOUNTER — Encounter: Payer: Self-pay | Admitting: Vascular Surgery

## 2020-12-10 ENCOUNTER — Telehealth: Payer: Self-pay | Admitting: Internal Medicine

## 2020-12-10 ENCOUNTER — Telehealth: Payer: Self-pay | Admitting: *Deleted

## 2020-12-10 NOTE — Telephone Encounter (Signed)
Laura Reed called in to see if we had received a fax for the [place] to get it refill and requesting info that was faxed on June 3rd. If we have no she would like to be called back at [phone]. If we have received it she would like it faxed back to [phone].

## 2020-12-10 NOTE — Chronic Care Management (AMB) (Signed)
  Care Management   Note  12/10/2020 Name: Laura Reed MRN: [id number] DOB: 15-Feb-1961  Laura Reed is a 60 y.o. year old female who is a primary care patient of Derrel Nip, [name], MD and is actively engaged with the care management team. I reached out to [name] by phone today to assist with re-scheduling a follow up visit with the Pharmacist  Follow up plan: Unsuccessful telephone outreach attempt made. A HIPAA compliant phone message was left for the patient providing contact information and requesting a return call.  The care management team will reach out to the patient again over the next 1 days.  If patient returns call to provider office, please advise to call [place] at [phone].  [place] Management

## 2020-12-11 ENCOUNTER — Ambulatory Visit (INDEPENDENT_AMBULATORY_CARE_PROVIDER_SITE_OTHER): Payer: Medicare Other | Admitting: Pharmacist

## 2020-12-11 DIAGNOSIS — E1165 Type 2 diabetes mellitus with hyperglycemia: Secondary | ICD-10-CM

## 2020-12-11 DIAGNOSIS — F411 Generalized anxiety disorder: Secondary | ICD-10-CM

## 2020-12-11 DIAGNOSIS — Z794 Long term (current) use of insulin: Secondary | ICD-10-CM | POA: Diagnosis not present

## 2020-12-11 DIAGNOSIS — I10 Essential (primary) hypertension: Secondary | ICD-10-CM

## 2020-12-11 DIAGNOSIS — I25118 Atherosclerotic heart disease of native coronary artery with other forms of angina pectoris: Secondary | ICD-10-CM | POA: Diagnosis not present

## 2020-12-11 DIAGNOSIS — E1121 Type 2 diabetes mellitus with diabetic nephropathy: Secondary | ICD-10-CM | POA: Diagnosis not present

## 2020-12-11 DIAGNOSIS — I739 Peripheral vascular disease, unspecified: Secondary | ICD-10-CM

## 2020-12-11 NOTE — Patient Instructions (Signed)
Visit Information  PATIENT GOALS: Goals Addressed              This Visit's Progress     Patient Stated   .  Medication Monitoring (pt-stated)        Patient Goals/Self-Care Activities . Over the next 90 days, patient will:  - take medications as prescribed check glucose three times daily using CGM, document, and provide at future appointments check blood pressure periodically, document, and provide at future appointments        Patient verbalizes understanding of instructions provided today and agrees to view in [place].   Plan: Telephone follow up appointment with care management team member scheduled for:  ~ 12 weeks  [name], PharmD, [place], [place] Clinical Pharmacist [employer] [facility] [employer] [phone]

## 2020-12-11 NOTE — Telephone Encounter (Signed)
Will submit refill request via [facility]

## 2020-12-11 NOTE — Chronic Care Management (AMB) (Signed)
Chronic Care Management Pharmacy Note  12/11/2020 Name:  Laura Reed MRN:  [id number] DOB:  1960-11-20  Subjective: Laura Reed is an 60 y.o. year old female who is a primary patient of Derrel Nip, [name], MD.  The CCM team was consulted for assistance with disease management and care coordination needs.    Engaged with patient by telephone for follow up visit in response to provider referral for pharmacy case management and/or care coordination services.   Consent to Services:  The patient was given information about Chronic Care Management services, agreed to services, and gave verbal consent prior to initiation of services.  Please see initial visit note for detailed documentation.   Patient Care Team: [name], MD as PCP - General (Internal Medicine) Bary Castilla Forest Gleason, MD (General Surgery) [name], MD (Internal Medicine) De Hollingshead, RPH-CPP as Pharmacist (Pharmacist) Carson Myrtle, Maree Erie, LCSW as Social Worker (Licensed Clinical Social Worker)  Recent office visits:  5/12 - PCP- increase Tyler Aas to 25 units, Jardiance to 25 mg  Recent consult visits:  5/9 - vascular [name]- recommend angiography of LLE   5/19 - cardiology [name]- continue current regimen  5/31 - lower extremity angiography - stent placement, mechanical thrombectomy  Hospital visits: None in previous 6 months  Objective:  Lab Results  Component Value Date   CREATININE 0.99 12/04/2020   CREATININE 0.89 11/15/2020   CREATININE 0.98 08/16/2020    Lab Results  Component Value Date   HGBA1C 7.6 (H) 11/15/2020   Last diabetic Eye exam:  Lab Results  Component Value Date/Time   HMDIABEYEEXA Retinopathy (A) 07/11/2020 12:00 AM    Last diabetic Foot exam:  Lab Results  Component Value Date/Time   HMDIABFOOTEX abnormal 03/30/2014 12:00 AM        Component Value Date/Time   CHOL 114 11/15/2020 1016   TRIG 84.0 11/15/2020 1016   HDL 55.30 11/15/2020 1016   CHOLHDL 2  11/15/2020 1016   VLDL 16.8 11/15/2020 1016   LDLCALC 42 11/15/2020 1016   LDLDIRECT 47.0 01/28/2017 1509    Hepatic Function Latest Ref Rng & Units 11/15/2020 08/16/2020 05/09/2020  Total Protein 6.0 - 8.3 g/dL 7.1 6.6 6.6  Albumin 3.5 - 5.2 g/dL 4.3 4.0 4.0  AST 0 - 37 U/L $Remo'20 17 14  'MsXQn$ ALT 0 - 35 U/L $Remo'19 18 16  'PypKa$ Alk Phosphatase 39 - 117 U/L 86 89 102  Total Bilirubin 0.2 - 1.2 mg/dL 0.5 0.4 0.5    Lab Results  Component Value Date/Time   TSH 0.96 05/18/2015 09:22 AM   TSH 1.03 03/17/2014 10:27 AM    CBC Latest Ref Rng & Units 03/15/2020 01/19/2019 01/18/2019  WBC 4.0 - 10.5 K/uL 9.2 8.9 11.4(H)  Hemoglobin 12.0 - 15.0 g/dL 13.6 12.2 12.4  Hematocrit 36.0 - 46.0 % 37.4 37.1 36.8  Platelets 150 - 400 K/uL 274 289 271     Clinical ASCVD: Yes  The ASCVD Risk score Mikey Bussing DC Jr., et al., 2013) failed to calculate for the following reasons:   The valid total cholesterol range is 130 to 320 mg/dL     Social History   Tobacco Use  Smoking Status Former Smoker  . Packs/day: 2.00  . Years: 35.00  . Pack years: 70.00  . Types: Cigarettes  . Quit date: 03/29/2013  . Years since quitting: 7.7  Smokeless Tobacco Never Used   BP Readings from Last 3 Encounters:  12/04/20 (!) 135/56  11/15/20 130/72  11/12/20 92/63  Pulse Readings from Last 3 Encounters:  12/04/20 71  11/15/20 83  11/12/20 89   Wt Readings from Last 3 Encounters:  12/04/20 147 lb (66.7 kg)  11/15/20 147 lb 9.6 oz (67 kg)  11/12/20 147 lb 6.4 oz (66.9 kg)    Assessment: Review of patient past medical history, allergies, medications, health status, including review of consultants reports, laboratory and other test data, was performed as part of comprehensive evaluation and provision of chronic care management services.   SDOH:  (Social Determinants of Health) assessments and interventions performed:  SDOH Interventions   Flowsheet Row Most Recent Value  SDOH Interventions   Financial Strain Interventions  Intervention Not Indicated  Stress Interventions Provide Counseling      CCM Care Plan  No Known Allergies  Medications Reviewed Today    Reviewed by De Hollingshead, RPH-CPP (Pharmacist) on 12/11/20 at [phone]  Med List Status: <None>  Medication Order Taking? Sig Documenting Provider Last Dose Status Informant  ACCU-CHEK AVIVA PLUS test strip [id number]   [provider]  Active   acetaminophen (TYLENOL) 500 MG tablet [id number] Yes Take 1,000 mg by mouth every 6 (six) hours as needed for mild pain or headache.  [provider] Taking Active Self           Med Note [name] Sep 25, 2020 10:02 AM)    ALPRAZolam Duanne Moron) 0.5 MG tablet [id number] Yes TAKE 1 TABLET(0.5 MG) BY MOUTH DAILY AS NEEDED FOR ANXIETY [name], MD Taking Active            Med Note De Hollingshead   Tue Dec 11, 2020  9:52 AM) Q2-3 nights  amLODipine (NORVASC) 5 MG tablet [id number] Yes TAKE 1 TABLET(5 MG) BY MOUTH DAILY [name], MD Taking Active   aspirin 81 MG chewable tablet [id number] Yes Chew 81 mg by mouth at bedtime.  [provider] Taking Active Self  atorvastatin (LIPITOR) 20 MG tablet [id number] Yes Take 1 tablet (20 mg total) by mouth daily. [name], MD Taking Active   Cholecalciferol (VITAMIN D3) 2000 UNITS TABS [id number] Yes Take 2,000 Units by mouth daily after supper.  [provider] Taking Active Self  clopidogrel (PLAVIX) 75 MG tablet [id number] Yes TAKE 1 TABLET(75 MG) BY MOUTH DAILY [name], MD Taking Active   Continuous Blood Gluc Sensor (FREESTYLE LIBRE 2 SENSOR) [place] [id number] Yes Use to check sugar at least 4 times daily [name], MD Taking Active   empagliflozin (JARDIANCE) 25 MG TABS tablet [id number] Yes Take 1 tablet (25 mg total) by mouth daily before breakfast. [name], MD Taking Active   gabapentin (NEURONTIN) 800 MG tablet [id number] Yes Take 1 tablet (800 mg total) by mouth 3 (three) times  daily. [name], MD Taking Active   insulin degludec (TRESIBA FLEXTOUCH) 100 UNIT/ML FlexTouch Pen [id number] Yes Inject 25 Units into the skin daily. [name], MD Taking Active   insulin lispro (HUMALOG) 100 UNIT/ML KwikPen [id number] No INJECT 10 UNITS UNDER THE SKIN THREE TIMES DAILY BEFORE MEALS  Patient not taking: No sig reported   [provider] Not Taking Active   Insulin Pen Needle (PEN NEEDLES) 32G X 4 MM MISC [id number] Yes Use to take insulin daily [name], MD Taking Active   Lancets Misc. [place] [id number] Yes Use to check blood sugars 4 times daily [name], MD Taking Active   losartan (COZAAR) 50 MG  tablet [id number] Yes TAKE 1 TABLET(50 MG) BY MOUTH DAILY [name], MD Taking Active   Multiple Vitamin (MULTIVITAMIN) tablet [id number] Yes Take 1 tablet by mouth daily. [provider] Taking Active   OZEMPIC, 1 MG/DOSE, 4 MG/3ML SOPN [id number] Yes INJECT 1 MG UNDER THE SKIN ONCE WEEKLY [name], MD Taking Active           Patient Active Problem List   Diagnosis Date Noted  . Atherosclerosis of artery of extremity with ulceration ([place]) 07/06/2020  . Anxiety state 04/01/2017  . Atherosclerosis of native arteries of extremity with intermittent claudication ([place]) 01/18/2017  . Carotid stenosis 12/04/2016  . Insomnia secondary to anxiety 02/21/2016  . Thyroid nodule 02/21/2016  . Hospital discharge follow-up 11/11/2015  . Encounter for preventive health examination 08/02/2015  . S/p nephrectomy 05/15/2015  . Diabetic nephropathy with proteinuria ([place]) 01/27/2015  . Overweight (BMI 25.0-29.9) 10/07/2014  . History of renal cell carcinoma 09/28/2014  . CAD (coronary artery disease) 08/24/2014  . Hernia of abdominal cavity 08/10/2014  . Hyperlipidemia associated with type 2 diabetes mellitus ([place]) 06/17/2014  . S/P carotid endarterectomy 01/22/2014  . History of shingles 05/18/2013  . Preoperative evaluation of a medical  condition to rule out surgical contraindications (TAR required) 04/06/2013  . Diabetic peripheral neuropathy associated with type 2 diabetes mellitus ([place]) 04/06/2013  . Chronic hip pain 01/16/2013  . Routine general medical examination at a health care facility 06/26/2012  . PVD (peripheral vascular disease) ([place])   . History of tobacco abuse 04/29/2011  . Controlled type 2 DM with peripheral circulatory disorder ([place])   . Hypertension   . Hemorrhoid     Immunization History  Administered Date(s) Administered  . Influenza Split 03/16/2012  . Influenza,inj,Quad PF,6+ Mos 04/06/2013, 03/14/2014, 03/27/2017, 03/25/2018, 05/11/2019, 03/30/2020  . Influenza-Unspecified 04/02/2015, 03/14/2016  . Moderna Sars-Covid-2 Vaccination 09/28/2019, 10/26/2019, 05/29/2020, 10/29/2020  . Pneumococcal Conjugate-13 06/15/2014  . Pneumococcal Polysaccharide-23 07/07/2010  . Tdap 03/16/2012    Conditions to be addressed/monitored: CAD, HTN, HLD, DMII and CKD  Care Plan : Medication Management  Updates made by De Hollingshead, RPH-CPP since 12/11/2020 12:00 AM    Problem: Diabetes, HTN, HLD, CKD, CAD     Long-Range Goal: Disease Progression Prevention   This Visit's Progress: On track  Recent Progress: On track  Priority: High  Note:   Current Barriers:  . Unable to achieve control of diabetes  . Suboptimal therapeutic regimen for diabetes  Pharmacist Clinical Goal(s):  Marland Kitchen Over the next 90 days, patient will achieve control of diabetes as evidenced by A1c  through collaboration with PharmD and provider.   Interventions: . 1:1 collaboration with [name], MD regarding development and update of comprehensive plan of care as evidenced by provider attestation and co-signature . Inter-disciplinary care team collaboration (see longitudinal plan of care) . Comprehensive medication review performed; medication list updated in electronic medical record  SDOH: . Improving stress related to  son and his relationship.  Health Maintenance: . Up to date on COVID vaccinations . Up to date on eye exam, foot exam  Diabetes: . Uncontrolled but more relaxed goal to prevent hypoglycemia likely appropriate; current treatment: Ozempic 1 mg weekly, Tresiba 25 units daily, Jardiance 25 mg daily  . Does note a yeast infection, but relates to having been on antibiotics and change in routine s/p vascular procedure. No other concerns with burning/itching while urinating since increasing Jardiance dose.  o Resistant to metformin d/t things she has heard  about lawsuits.  . Current glucose readings: using Libre 2 Date of Download: 5/23-12/09/20 % Time CGM is active: 93% Average Glucose: 147 mg/dL Glucose Management Indicator: 6.8% Glucose Variability: 19.5 (goal <36%) Time in Goal:  - Time in range 70-180: 89% - Time above range: 11% - Time below range: 0% Observed patterns: occasional post prandial elevations, but no consistent patterns.  . Reports that she is focusing on fresh vegetables, lean proteins, small portion sizes of carbohydrates . Will avoid increasing Ozempic to avoid further weight loss. Continue current regimen at this time.  . Reviewed more relaxed A1c goal with avoidance of hypoglycemia . Completed reorder request for [place] 2 sensors through [employer] through [employer]. Will follow.  Hypertension: . Controlled per last office reading; current treatment: amlodipine 5 mg daily, losartan 50 mg daily  . Denies any lower extremity edema . Current home readings: ~117/59, HR 70 . Denies episodes of lightheadedness, dizziness . Fill history up to date . Recommend to continue current regimen at this time.   Hyperlipidemia and secondary ASCVD risk reduction (severe PVD) . Controlled per last lipid panel at a goal of <55; current treatment: atorvastatin 20 mg daily  . Antiplatelet regimen: aspirin 81 mg daily, clopidogrel 75 mg daily  . S/p L lower  extremity revascularization. Continue to follow with Vascular. Continue tight BP, DM, lipid control and antiplatelet management, avoid reinitiation of tobacco use.  Marland Kitchen Recommended to continue current regimen at this time  Anxiety/Insomnia: . Moderately well managed at this time; reports increased stress related to her son's relationship. Current regimen: alprazolam 0.5 mg QPM PRN - using 2-3 times weekly . Counseled on long term risks of benzodiazepines. Continue current regimen at this time. If more frequent need moving forward, consider initiation of daily maintenance antianxiety medication. Will discuss sleep hygiene + melatonin moving forward.   Peripheral Neuropathy: . Moderately well controlled; current regimen: gabapentin 800 mg TID . Max labeled dose per renal function is 1800 mg daily, but patient denies sedation, lower extremity edema. Appropriate to continue current regimen at this time given severity of vascular disease and benefit of pain control outweighing risk of side effects. 3 . Recommended to continue current regimen as above.   Supplements: Vitamin D daily, multivitamin daily  Patient Goals/Self-Care Activities . Over the next 90 days, patient will:  - take medications as prescribed check glucose three times daily using CGM, document, and provide at future appointments check blood pressure periodically, document, and provide at future appointments  Follow Up Plan: Telephone follow up appointment with care management team member scheduled for: ~ 12 weeks       Medication Assistance: None required.  Patient affirms current coverage meets needs.  Patient's preferred pharmacy is:  [id number] [address], [place] [address] Phone: [phone] Fax: [phone]   Follow Up:  Patient agrees to Care Plan and Follow-up.  Plan: Telephone follow up appointment with care  management team member scheduled for:  ~ 12 weeks  [name], PharmD, [place], [place] Clinical Pharmacist [employer] at [employer] [phone]

## 2020-12-12 DIAGNOSIS — Z794 Long term (current) use of insulin: Secondary | ICD-10-CM | POA: Diagnosis not present

## 2020-12-12 DIAGNOSIS — E118 Type 2 diabetes mellitus with unspecified complications: Secondary | ICD-10-CM | POA: Diagnosis not present

## 2020-12-12 NOTE — Chronic Care Management (AMB) (Signed)
Patient was able to keep appointment with PharmD C. Darnelle Maffucci. On 12/11/2020  [place] [facility]

## 2020-12-13 DIAGNOSIS — E113512 Type 2 diabetes mellitus with proliferative diabetic retinopathy with macular edema, left eye: Secondary | ICD-10-CM | POA: Diagnosis not present

## 2020-12-18 ENCOUNTER — Other Ambulatory Visit (INDEPENDENT_AMBULATORY_CARE_PROVIDER_SITE_OTHER): Payer: Self-pay | Admitting: Vascular Surgery

## 2020-12-18 ENCOUNTER — Other Ambulatory Visit: Payer: Self-pay | Admitting: Internal Medicine

## 2020-12-18 DIAGNOSIS — Z95820 Peripheral vascular angioplasty status with implants and grafts: Secondary | ICD-10-CM

## 2020-12-18 DIAGNOSIS — Z794 Long term (current) use of insulin: Secondary | ICD-10-CM

## 2020-12-18 DIAGNOSIS — I70222 Atherosclerosis of native arteries of extremities with rest pain, left leg: Secondary | ICD-10-CM

## 2020-12-18 NOTE — Telephone Encounter (Signed)
Per [place] 2 sensor refill approved through [facility]. Delivered 12/14/20 and confirmed by [employer] tracking.

## 2020-12-19 ENCOUNTER — Other Ambulatory Visit: Payer: Self-pay

## 2020-12-19 ENCOUNTER — Ambulatory Visit (INDEPENDENT_AMBULATORY_CARE_PROVIDER_SITE_OTHER): Payer: Medicare Other

## 2020-12-19 ENCOUNTER — Encounter (INDEPENDENT_AMBULATORY_CARE_PROVIDER_SITE_OTHER): Payer: Self-pay | Admitting: Nurse Practitioner

## 2020-12-19 ENCOUNTER — Ambulatory Visit (INDEPENDENT_AMBULATORY_CARE_PROVIDER_SITE_OTHER): Payer: Medicare Other | Admitting: Nurse Practitioner

## 2020-12-19 VITALS — BP 77/42 | HR 72 | Resp 16 | Wt 146.0 lb

## 2020-12-19 DIAGNOSIS — I6523 Occlusion and stenosis of bilateral carotid arteries: Secondary | ICD-10-CM | POA: Diagnosis not present

## 2020-12-19 DIAGNOSIS — Z95820 Peripheral vascular angioplasty status with implants and grafts: Secondary | ICD-10-CM

## 2020-12-19 DIAGNOSIS — E785 Hyperlipidemia, unspecified: Secondary | ICD-10-CM | POA: Diagnosis not present

## 2020-12-19 DIAGNOSIS — E1169 Type 2 diabetes mellitus with other specified complication: Secondary | ICD-10-CM

## 2020-12-19 DIAGNOSIS — I10 Essential (primary) hypertension: Secondary | ICD-10-CM

## 2020-12-19 DIAGNOSIS — I70222 Atherosclerosis of native arteries of extremities with rest pain, left leg: Secondary | ICD-10-CM | POA: Diagnosis not present

## 2020-12-19 DIAGNOSIS — L97909 Non-pressure chronic ulcer of unspecified part of unspecified lower leg with unspecified severity: Secondary | ICD-10-CM | POA: Diagnosis not present

## 2020-12-19 DIAGNOSIS — I70299 Other atherosclerosis of native arteries of extremities, unspecified extremity: Secondary | ICD-10-CM

## 2020-12-25 ENCOUNTER — Encounter (INDEPENDENT_AMBULATORY_CARE_PROVIDER_SITE_OTHER): Payer: Self-pay | Admitting: Nurse Practitioner

## 2020-12-25 NOTE — Progress Notes (Signed)
Subjective:    Patient ID: Laura Reed, female    DOB: 1960-10-26, 60 y.o.   MRN: [id number] Chief Complaint  Patient presents with   Follow-up    [facility] 2wk post le angio    The patient returns to the office for followup and review status post angiogram with intervention. The patient notes improvement in the lower extremity symptoms. No interval shortening of the patient's claudication distance or rest pain symptoms. Previous wounds have now healed.  No new ulcers or wounds have occurred since the last visit.  There have been no significant changes to the patient's overall health care.  The patient denies amaurosis fugax or recent TIA symptoms. There are no recent neurological changes noted. The patient denies history of DVT, PE or superficial thrombophlebitis. The patient denies recent episodes of angina or shortness of breath.   ABI's Rt=1.11 and Lt=1.15  (previous ABI's Rt=1.01 and Lt=0.60) Duplex US of the bilateral tibial arteries reveals triphasic waveforms with good toe waveforms on the right.  The left has an amputation therefore not waveforms.     Review of Systems  Cardiovascular:  Negative for leg swelling.  All other systems reviewed and are negative.     Objective:   Physical Exam Vitals reviewed.  HENT:     Head: Normocephalic.  Cardiovascular:     Rate and Rhythm: Normal rate.     Pulses: Normal pulses.  Pulmonary:     Effort: Pulmonary effort is normal.  Musculoskeletal:     Left Lower Extremity: Left leg is amputated below ankle.  Neurological:     Mental Status: She is alert and oriented to person, place, and time.  Psychiatric:        Mood and Affect: Mood normal.        Behavior: Behavior normal.        Thought Content: Thought content normal.        Judgment: Judgment normal.    BP (!) 77/42 (BP Location: Right Arm)   Pulse 72   Resp 16   Wt 146 lb (66.2 kg)   BMI 22.87 kg/m   Past Medical History:  Diagnosis Date   Absence of kidney     left   Anxiety    Arthritis    Bladder cancer (HCC)    CHF (congestive heart failure) (HCC)    Complication of anesthesia    BP HAS  RUN LOW AFTER SURGERY-LUNGS FILLED UP WITH FLUID AFTER  LEG STENT SURGERY    Coronary artery disease    Diabetes mellitus    Family history of adverse reaction to anesthesia    Sister - PONV   GERD (gastroesophageal reflux disease)    OCC TUMS   Heart murmur    Hemorrhoid    History of methicillin resistant staphylococcus aureus (MRSA) 2007   Hypertension    Neuropathy    PVD (peripheral vascular disease) (HCC)    Thyroid nodule    right   Urothelial carcinoma of kidney ([place]) 10/31/2014   INVASIVE UROTHELIAL CARCINOMA, LOW GRADE. T1, Nx.   Wears dentures    full upper and lower    Social History   Socioeconomic History   Marital status: Legally Separated    Spouse name: Not on file   Number of children: 2   Years of education: 12   Highest education level: 12th grade  Occupational History   Occupation: Disabled  Tobacco Use   Smoking status: Former    Packs/day: 2.00  Years: 35.00    Pack years: 70.00    Types: Cigarettes    Quit date: 03/29/2013    Years since quitting: 7.7   Smokeless tobacco: Never  Vaping Use   Vaping Use: Former  Substance and Sexual Activity   Alcohol use: Not Currently    Alcohol/week: 0.0 standard drinks    Comment: LAST DRINK 2009   Drug use: Not Currently    Types: Cocaine    Comment: last used in 2007   Sexual activity: Not Currently  Other Topics Concern   Not on file  Social History Narrative   Not on file   Social Determinants of Health   Financial Resource Strain: Low Risk    Difficulty of Paying Living Expenses: Not hard at all  Food Insecurity: No Food Insecurity   Worried About Charity fundraiser in the Last Year: Never true   [place] in the Last Year: Never true  Transportation Needs: No Transportation Needs   Lack of Transportation (Medical): No   Lack of  Transportation (Non-Medical): No  Physical Activity: Inactive   Days of Exercise per Week: 0 days   Minutes of Exercise per Session: 0 min  Stress: Stress Concern Present   Feeling of Stress : To some extent  Social Connections: Moderately Isolated   Frequency of Communication with Friends and Family: More than three times a week   Frequency of Social Gatherings with Friends and Family: More than three times a week   Attends Religious Services: 1 to 4 times per year   Active Member of [employer] or Organizations: No   Attends Archivist Meetings: Never   Marital Status: Separated  Intimate Partner Violence: Not At Risk   Fear of Current or Ex-Partner: No   Emotionally Abused: No   Physically Abused: No   Sexually Abused: No    Past Surgical History:  Procedure Laterality Date   AMPUTATION TOE     right (4th and 5th); left (great toe, 3rd)   AMPUTATION TOE Right 07/16/2018   Procedure: AMPUTATION TOE/MPJ right 2nd;  Surgeon: Sharlotte Alamo, DPM;  Location: [facility];  Service: Podiatry;  Laterality: Right;   ARTERIAL BYPASS SURGRY  2009, 2013 x 2   right leg , done in [place] Right 01/2014   Dr Delana Meyer   CATARACT EXTRACTION W/PHACO Right 12/14/2014   Procedure: CATARACT EXTRACTION PHACO AND INTRAOCULAR LENS PLACEMENT ([place]);  Surgeon: Lyla Glassing, MD;  Location: [facility];  Service: Ophthalmology;  Laterality: Right;  [place]   00:38.6              AP        7.1                   CDE  2.76   CATARACT EXTRACTION W/PHACO Left 12/06/2019   Procedure: CATARACT EXTRACTION PHACO AND INTRAOCULAR LENS PLACEMENT ([place]) LEFT DIABETIC;  Surgeon: Birder Robson, MD;  Location: [facility];  Service: Ophthalmology;  Laterality: Left;  9.08 1:06.4   CESAREAN SECTION     CHOLECYSTECTOMY  03-03-12   Porcelain gallbladder, gallstones,  [place]   COLONOSCOPY W/ BIOPSIES  04/28/2012   Hyperplastic rectal polyps.   CORONARY ARTERY BYPASS  GRAFT  2009   3 vessel   CYSTOSCOPY W/ RETROGRADES Right 09/01/2016   Procedure: CYSTOSCOPY WITH RETROGRADE PYELOGRAM;  Surgeon: Hollice Espy, MD;  Location: [facility];  Service: Urology;  Laterality: Right;  CYSTOSCOPY W/ RETROGRADES Bilateral 03/19/2020   Procedure: CYSTOSCOPY WITH RETROGRADE PYELOGRAM;  Surgeon: Hollice Espy, MD;  Location: [facility];  Service: Urology;  Laterality: Bilateral;   CYSTOSCOPY WITH BIOPSY N/A 03/19/2020   Procedure: CYSTOSCOPY WITH BIOPSY;  Surgeon: Hollice Espy, MD;  Location: [facility];  Service: Urology;  Laterality: N/A;   EYE SURGERY     HERNIA REPAIR  10-31-14   ventral, retro-rectus atrium mesh   IRRIGATION AND DEBRIDEMENT FOOT Left 01/18/2019   Procedure: IRRIGATION AND DEBRIDEMENT FOOT;  Surgeon: Sharlotte Alamo, DPM;  Location: [facility];  Service: Podiatry;  Laterality: Left;   LOWER EXTREMITY ANGIOGRAPHY Left 12/10/2016   Procedure: Lower Extremity Angiography;  Surgeon: Katha Cabal, MD;  Location: [place] [facility];  Service: Cardiovascular;  Laterality: Left;   LOWER EXTREMITY ANGIOGRAPHY Left 02/02/2018   Procedure: LOWER EXTREMITY ANGIOGRAPHY;  Surgeon: Katha Cabal, MD;  Location: [place] [facility];  Service: Cardiovascular;  Laterality: Left;   LOWER EXTREMITY ANGIOGRAPHY Left 05/05/2018   Procedure: LOWER EXTREMITY ANGIOGRAPHY;  Surgeon: Katha Cabal, MD;  Location: [place] [facility];  Service: Cardiovascular;  Laterality: Left;   LOWER EXTREMITY ANGIOGRAPHY Left 12/04/2020   Procedure: LOWER EXTREMITY ANGIOGRAPHY with Intervention;  Surgeon: Katha Cabal, MD;  Location: [place] [facility];  Service: Cardiovascular;  Laterality: Left;   NEPHRECTOMY Left 10-31-14   PERIPHERAL VASCULAR CATHETERIZATION Left 05/01/2015   Procedure: Lower Extremity Angiography;  Surgeon: Katha Cabal, MD;  Location: [place] [facility];  Service: Cardiovascular;  Laterality: Left;   PERIPHERAL VASCULAR CATHETERIZATION  05/01/2015    Procedure: Lower Extremity Intervention;  Surgeon: Katha Cabal, MD;  Location: [place] [facility];  Service: Cardiovascular;;   PERIPHERAL VASCULAR CATHETERIZATION Left 02/20/2015   Procedure: Pelvic Angiography;  Surgeon: Katha Cabal, MD;  Location: [place] [facility];  Service: Cardiovascular;  Laterality: Left;   TRANSURETHRAL RESECTION OF BLADDER TUMOR WITH MITOMYCIN-C N/A 09/01/2016   Procedure: TRANSURETHRAL RESECTION OF BLADDER TUMOR WITH MITOMYCIN-C;  Surgeon: Hollice Espy, MD;  Location: [facility];  Service: Urology;  Laterality: N/A;    Family History  Problem Relation Age of Onset   Cancer Mother 45       Lung Cancer   Cancer Father 73       Lung Ca   Diabetes Son    Breast cancer Maternal Grandmother    Kidney cancer Neg Hx    Bladder Cancer Neg Hx    Prostate cancer Neg Hx     No Known Allergies  CBC Latest Ref Rng & Units 03/15/2020 01/19/2019 01/18/2019  WBC 4.0 - 10.5 K/uL 9.2 8.9 11.4(H)  Hemoglobin 12.0 - 15.0 g/dL 13.6 12.2 12.4  Hematocrit 36.0 - 46.0 % 37.4 37.1 36.8  Platelets 150 - 400 K/uL 274 289 271      CMP     Component Value Date/Time   NA 137 11/15/2020 1016   NA 135 11/02/2014 0609   K 4.3 11/15/2020 1016   K 4.2 11/02/2014 0609   CL 100 11/15/2020 1016   CL 107 11/02/2014 0609   CO2 30 11/15/2020 1016   CO2 23 11/02/2014 0609   GLUCOSE 147 (H) 11/15/2020 1016   GLUCOSE 108 (H) 11/02/2014 0609   BUN 22 12/04/2020 0921   BUN 20 11/02/2014 0609   CREATININE 0.99 12/04/2020 0921   CREATININE 1.01 11/09/2015 1549   CREATININE 1.01 11/09/2015 1549   CALCIUM 9.7 11/15/2020 1016   CALCIUM 7.3 (L) 11/02/2014 4128  PROT 7.1 11/15/2020 1016   PROT 5.1 (L) 06/03/2013 0353   ALBUMIN 4.3 11/15/2020 1016   ALBUMIN 2.2 (L) 06/03/2013 0353   AST 20 11/15/2020 1016   AST 7 (L) 06/03/2013 0353   ALT 19 11/15/2020 1016   ALT 10 (L) 06/03/2013 0353   ALKPHOS 86 11/15/2020 1016   ALKPHOS 74 06/03/2013 0353   BILITOT 0.5 11/15/2020  1016   BILITOT 0.2 06/03/2013 0353   GFRNONAA >60 12/04/2020 0921   GFRNONAA 50 (L) 11/02/2014 0609   GFRAA >60 01/19/2019 0357   GFRAA 58 (L) 11/02/2014 0609     VAS [place] ABI WITH/WO TBI  Result Date: 12/20/2020  LOWER EXTREMITY DOPPLER STUDY Patient Name:  Laura Reed  Date of Exam:   12/19/2020 Medical Rec #: [id number]        Accession #:    [id number] Date of Birth: 1961-03-04        Patient Gender: F Patient Age:   60Y Exam Location:  [facility] Procedure:      VAS [place] ABI WITH/WO TBI Referring Phys: [id number] [place] --------------------------------------------------------------------------------  Indications: Peripheral artery disease, and ASO S/P Intervention. Other Factors: H/O right 4th & 5th and left 1st & 3rd toe amputations                12/03/2020 left pop thrombectomy and new stent.  Vascular Interventions: H/O left-to-right fem-fem BPG with lower extremity                         stents & BPGs in [place];                         10/11/09: Left external iliac/common femoral artery PTA;                         07/25/11: Right profunda femoral to TP trunk BPG;                         12/31/11: Revision of fem-fem BPG & redo of anastomosis;                         09/16/12: Fem-fem BPG, left external iliac, left common                         femoral & left profunda femoral artery PTAs;                         06/01/13: Left external iliac to profunda femoral BPG                         with revision of the fem-fem & left fem-AK popliteal BPG                         proximal anastomoses;                         08/02/13: Left distal SFA/popliteal artery PTA;                         02/20/15: Left external iliac artery PTA/stent with left  common iliac artery PTA;                         12/10/16: Left external iliac & common femoral artery                         PTAs. Comparison Study: 11/12/20 Performing Technologist: Concha Norway RVT  Examination Guidelines:  A complete evaluation includes at minimum, Doppler waveform signals and systolic blood pressure reading at the level of bilateral brachial, anterior tibial, and posterior tibial arteries, when vessel segments are accessible. Bilateral testing is considered an integral part of a complete examination. Photoelectric Plethysmograph (PPG) waveforms and toe systolic pressure readings are included as required and additional duplex testing as needed. Limited examinations for reoccurring indications may be performed as noted.  ABI Findings: +---------+------------------+-----+--------+--------+ Right    Rt Pressure (mmHg)IndexWaveformComment  +---------+------------------+-----+--------+--------+ Brachial 96                                      +---------+------------------+-----+--------+--------+ ATA      107               1.11 biphasic         +---------+------------------+-----+--------+--------+ PTA      104               1.08 biphasic         +---------+------------------+-----+--------+--------+ Great Toe74                0.77 Normal           +---------+------------------+-----+--------+--------+ +---------+------------------+-----+--------+-------+ Left     Lt Pressure (mmHg)IndexWaveformComment +---------+------------------+-----+--------+-------+ ATA      103               1.07 biphasic        +---------+------------------+-----+--------+-------+ PTA      110               1.15 biphasic        +---------+------------------+-----+--------+-------+ Great Toe                               amp     +---------+------------------+-----+--------+-------+ +-------+-----------+-----------+------------+------------+ ABI/TBIToday's ABIToday's TBIPrevious ABIPrevious TBI +-------+-----------+-----------+------------+------------+ Right  1.11       .77        1.01        .60          +-------+-----------+-----------+------------+------------+ Left   1.15        amp        .60         amp          +-------+-----------+-----------+------------+------------+ Left ABIs appear increased compared to prior study on 11/12/2020.  Summary: Right: Resting right ankle-brachial index is within normal range. No evidence of significant right lower extremity arterial disease. The right toe-brachial index is normal. Left: Resting left ankle-brachial index is within normal range. No evidence of significant left lower extremity arterial disease.  *See table(s) above for measurements and observations.  Electronically signed by Hortencia Pilar MD on 12/20/2020 at 4:41:21 PM.    Final        Assessment & Plan:   1. Atherosclerosis of artery of extremity with ulceration ([place]) Recommend:  The patient is status post successful angiogram with intervention.  The patient reports that the claudication  symptoms and leg pain is essentially gone.   The patient denies lifestyle limiting changes at this point in time.  No further invasive studies, angiography or surgery at this time The patient should continue walking and begin a more formal exercise program.  The patient should continue antiplatelet therapy and aggressive treatment of the lipid abnormalities  Patient should undergo noninvasive studies as ordered. The patient will follow up with me after the studies.    2. Primary hypertension Continue antihypertensive medications as already ordered, these medications have been reviewed and there are no changes at this time.   3. Bilateral carotid artery stenosis Carotid studies were stable when checked a few months ago.  We will continue to monitor on an annual basis.  4. Hyperlipidemia associated with type 2 diabetes mellitus ([place]) Continue statin as ordered and reviewed, no changes at this time    Current Outpatient Medications on File Prior to Visit  Medication Sig Dispense Refill   ACCU-CHEK AVIVA PLUS test strip      acetaminophen (TYLENOL) 500 MG tablet Take 1,000 mg  by mouth every 6 (six) hours as needed for mild pain or headache.      ALPRAZolam (XANAX) 0.5 MG tablet TAKE 1 TABLET(0.5 MG) BY MOUTH DAILY AS NEEDED FOR ANXIETY 30 tablet 5   amLODipine (NORVASC) 5 MG tablet TAKE 1 TABLET(5 MG) BY MOUTH DAILY 90 tablet 1   aspirin 81 MG chewable tablet Chew 81 mg by mouth at bedtime.      atorvastatin (LIPITOR) 20 MG tablet Take 1 tablet (20 mg total) by mouth daily. 90 tablet 1   Cholecalciferol (VITAMIN D3) 2000 UNITS TABS Take 2,000 Units by mouth daily after supper.      clopidogrel (PLAVIX) 75 MG tablet TAKE 1 TABLET(75 MG) BY MOUTH DAILY 90 tablet 1   Continuous Blood Gluc Sensor (FREESTYLE LIBRE 2 SENSOR) MISC Use to check sugar at least 4 times daily 2 each 2   empagliflozin (JARDIANCE) 25 MG TABS tablet Take 1 tablet (25 mg total) by mouth daily before breakfast. 30 tablet 2   gabapentin (NEURONTIN) 800 MG tablet Take 1 tablet (800 mg total) by mouth 3 (three) times daily. 270 tablet 3   insulin degludec (TRESIBA FLEXTOUCH) 100 UNIT/ML FlexTouch Pen Inject 25 Units into the skin daily. 21 mL 3   Insulin Pen Needle (PEN NEEDLES) 32G X 4 MM MISC Use to take insulin daily 100 each 3   Lancets Misc. MISC Use to check blood sugars 4 times daily 400 each 3   losartan (COZAAR) 50 MG tablet TAKE 1 TABLET(50 MG) BY MOUTH DAILY 90 tablet 1   Multiple Vitamin (MULTIVITAMIN) tablet Take 1 tablet by mouth daily.     multivitamin-lutein (OCUVITE-LUTEIN) CAPS capsule Take 1 capsule by mouth daily.     OZEMPIC, 1 MG/DOSE, 4 MG/3ML SOPN INJECT 1 MG UNDER THE SKIN ONCE A WEEK 3 mL 2   insulin lispro (HUMALOG) 100 UNIT/ML KwikPen INJECT 10 UNITS UNDER THE SKIN THREE TIMES DAILY BEFORE MEALS (Patient not taking: No sig reported)     No current facility-administered medications on file prior to visit.    There are no Patient Instructions on file for this visit. No follow-ups on file.   Kris Hartmann, NP

## 2021-01-08 ENCOUNTER — Other Ambulatory Visit: Payer: Self-pay | Admitting: Internal Medicine

## 2021-01-08 DIAGNOSIS — Z794 Long term (current) use of insulin: Secondary | ICD-10-CM

## 2021-01-12 DIAGNOSIS — Z794 Long term (current) use of insulin: Secondary | ICD-10-CM | POA: Diagnosis not present

## 2021-01-12 DIAGNOSIS — E118 Type 2 diabetes mellitus with unspecified complications: Secondary | ICD-10-CM | POA: Diagnosis not present

## 2021-01-14 ENCOUNTER — Other Ambulatory Visit: Payer: Self-pay | Admitting: Internal Medicine

## 2021-01-15 NOTE — Telephone Encounter (Signed)
Refilled: 07/05/2020 Last OV: 12/19/2020 Next OV: 02/15/2021

## 2021-01-17 ENCOUNTER — Other Ambulatory Visit: Payer: Self-pay | Admitting: Internal Medicine

## 2021-01-20 ENCOUNTER — Other Ambulatory Visit: Payer: Self-pay | Admitting: Internal Medicine

## 2021-01-24 ENCOUNTER — Encounter: Payer: Self-pay | Admitting: Internal Medicine

## 2021-01-24 DIAGNOSIS — Z961 Presence of intraocular lens: Secondary | ICD-10-CM | POA: Diagnosis not present

## 2021-01-24 DIAGNOSIS — E113512 Type 2 diabetes mellitus with proliferative diabetic retinopathy with macular edema, left eye: Secondary | ICD-10-CM | POA: Diagnosis not present

## 2021-01-24 LAB — HM DIABETES EYE EXAM

## 2021-01-30 DIAGNOSIS — I739 Peripheral vascular disease, unspecified: Secondary | ICD-10-CM | POA: Diagnosis not present

## 2021-01-30 DIAGNOSIS — Z794 Long term (current) use of insulin: Secondary | ICD-10-CM | POA: Diagnosis not present

## 2021-01-30 DIAGNOSIS — B351 Tinea unguium: Secondary | ICD-10-CM | POA: Diagnosis not present

## 2021-01-30 DIAGNOSIS — Z89421 Acquired absence of other right toe(s): Secondary | ICD-10-CM | POA: Diagnosis not present

## 2021-01-30 DIAGNOSIS — E1140 Type 2 diabetes mellitus with diabetic neuropathy, unspecified: Secondary | ICD-10-CM | POA: Diagnosis not present

## 2021-02-10 ENCOUNTER — Other Ambulatory Visit: Payer: Self-pay | Admitting: Internal Medicine

## 2021-02-12 DIAGNOSIS — Z794 Long term (current) use of insulin: Secondary | ICD-10-CM | POA: Diagnosis not present

## 2021-02-12 DIAGNOSIS — E118 Type 2 diabetes mellitus with unspecified complications: Secondary | ICD-10-CM | POA: Diagnosis not present

## 2021-02-15 ENCOUNTER — Other Ambulatory Visit (INDEPENDENT_AMBULATORY_CARE_PROVIDER_SITE_OTHER): Payer: Self-pay | Admitting: Nurse Practitioner

## 2021-02-15 ENCOUNTER — Ambulatory Visit: Payer: Medicare Other | Admitting: Internal Medicine

## 2021-02-15 DIAGNOSIS — I739 Peripheral vascular disease, unspecified: Secondary | ICD-10-CM

## 2021-02-18 ENCOUNTER — Encounter (INDEPENDENT_AMBULATORY_CARE_PROVIDER_SITE_OTHER): Payer: Medicare Other

## 2021-02-18 ENCOUNTER — Ambulatory Visit (INDEPENDENT_AMBULATORY_CARE_PROVIDER_SITE_OTHER): Payer: Medicare Other | Admitting: Vascular Surgery

## 2021-02-19 ENCOUNTER — Encounter (INDEPENDENT_AMBULATORY_CARE_PROVIDER_SITE_OTHER): Payer: Self-pay | Admitting: Vascular Surgery

## 2021-02-25 ENCOUNTER — Other Ambulatory Visit: Payer: Self-pay | Admitting: Internal Medicine

## 2021-02-25 DIAGNOSIS — E1165 Type 2 diabetes mellitus with hyperglycemia: Secondary | ICD-10-CM

## 2021-03-12 ENCOUNTER — Ambulatory Visit (INDEPENDENT_AMBULATORY_CARE_PROVIDER_SITE_OTHER): Payer: Medicare Other | Admitting: Pharmacist

## 2021-03-12 DIAGNOSIS — Z794 Long term (current) use of insulin: Secondary | ICD-10-CM

## 2021-03-12 DIAGNOSIS — E1142 Type 2 diabetes mellitus with diabetic polyneuropathy: Secondary | ICD-10-CM

## 2021-03-12 DIAGNOSIS — Z9889 Other specified postprocedural states: Secondary | ICD-10-CM

## 2021-03-12 DIAGNOSIS — I739 Peripheral vascular disease, unspecified: Secondary | ICD-10-CM

## 2021-03-12 DIAGNOSIS — E1165 Type 2 diabetes mellitus with hyperglycemia: Secondary | ICD-10-CM

## 2021-03-12 DIAGNOSIS — I10 Essential (primary) hypertension: Secondary | ICD-10-CM

## 2021-03-12 MED ORDER — PEN NEEDLES 32G X 4 MM MISC: 3 refills | Status: DC

## 2021-03-12 MED ORDER — TRESIBA FLEXTOUCH 100 UNIT/ML ~~LOC~~ SOPN: 22.0000 [IU] | PEN_INJECTOR | Freq: Every day | SUBCUTANEOUS | 2 refills | Status: DC

## 2021-03-12 MED ORDER — LOSARTAN POTASSIUM 50 MG PO TABS: ORAL_TABLET | ORAL | 3 refills | Status: DC

## 2021-03-12 MED ORDER — AMLODIPINE BESYLATE 2.5 MG PO TABS: 2.5000 mg | ORAL_TABLET | Freq: Every day | ORAL | 1 refills | Status: DC

## 2021-03-12 NOTE — Chronic Care Management (AMB) (Signed)
Chronic Care Management Pharmacy Note  03/12/2021 Name:  Laura Reed MRN:  [id number] DOB:  [birth year]  Subjective: Laura Reed is an 60 y.o. year old female who is a primary patient of Derrel Nip, [name], MD.  The CCM team was consulted for assistance with disease management and care coordination needs.    Engaged with patient by telephone for follow up visit in response to provider referral for pharmacy case management and/or care coordination services.   Consent to Services:  The patient was given information about Chronic Care Management services, agreed to services, and gave verbal consent prior to initiation of services.  Please see initial visit note for detailed documentation.   Patient Care Team: [name], MD as PCP - General (Internal Medicine) Bary Castilla Forest Gleason, MD (General Surgery) [name], MD (Internal Medicine) De Hollingshead, RPH-CPP as Pharmacist (Pharmacist) Carson Myrtle, Maree Erie, LCSW as Social Worker (Licensed Clinical Social Worker)   Objective:  Lab Results  Component Value Date   CREATININE 0.99 12/04/2020   CREATININE 0.89 11/15/2020   CREATININE 0.98 08/16/2020    Lab Results  Component Value Date   HGBA1C 7.6 (H) 11/15/2020   Last diabetic Eye exam:  Lab Results  Component Value Date/Time   HMDIABEYEEXA Retinopathy (A) 01/24/2021 01:12 PM    Last diabetic Foot exam:  Lab Results  Component Value Date/Time   HMDIABFOOTEX abnormal 03/30/2014 12:00 AM        Component Value Date/Time   CHOL 114 11/15/2020 1016   TRIG 84.0 11/15/2020 1016   HDL 55.30 11/15/2020 1016   CHOLHDL 2 11/15/2020 1016   VLDL 16.8 11/15/2020 1016   LDLCALC 42 11/15/2020 1016   LDLDIRECT 47.0 01/28/2017 1509    Hepatic Function Latest Ref Rng & Units 11/15/2020 08/16/2020 05/09/2020  Total Protein 6.0 - 8.3 g/dL 7.1 6.6 6.6  Albumin 3.5 - 5.2 g/dL 4.3 4.0 4.0  AST 0 - 37 U/L $Remo'20 17 14  'TuOnP$ ALT 0 - 35 U/L $Remo'19 18 16  'fRuzg$ Alk Phosphatase 39 - 117 U/L  86 89 102  Total Bilirubin 0.2 - 1.2 mg/dL 0.5 0.4 0.5    Lab Results  Component Value Date/Time   TSH 0.96 05/18/2015 09:22 AM   TSH 1.03 03/17/2014 10:27 AM    CBC Latest Ref Rng & Units 03/15/2020 01/19/2019 01/18/2019  WBC 4.0 - 10.5 K/uL 9.2 8.9 11.4(H)  Hemoglobin 12.0 - 15.0 g/dL 13.6 12.2 12.4  Hematocrit 36.0 - 46.0 % 37.4 37.1 36.8  Platelets 150 - 400 K/uL 274 289 271    No results found for: VD25OH  Clinical ASCVD: Yes  The ASCVD Risk score Mikey Bussing DC Jr., et al., 2013) failed to calculate for the following reasons:   The valid systolic blood pressure range is 90 to 200 mmHg   The valid total cholesterol range is 130 to 320 mg/dL      Social History   Tobacco Use  Smoking Status Former   Packs/day: 2.00   Years: 35.00   Pack years: 70.00   Types: Cigarettes   Quit date: 03/29/2013   Years since quitting: 7.9  Smokeless Tobacco Never   BP Readings from Last 3 Encounters:  12/19/20 (!) 77/42  12/04/20 (!) 135/56  11/15/20 130/72   Pulse Readings from Last 3 Encounters:  12/19/20 72  12/04/20 71  11/15/20 83   Wt Readings from Last 3 Encounters:  12/19/20 146 lb (66.2 kg)  12/04/20 147 lb (66.7 kg)  11/15/20  147 lb 9.6 oz (67 kg)    Assessment: Review of patient past medical history, allergies, medications, health status, including review of consultants reports, laboratory and other test data, was performed as part of comprehensive evaluation and provision of chronic care management services.   SDOH:  (Social Determinants of Health) assessments and interventions performed:  SDOH Interventions    Flowsheet Row Most Recent Value  SDOH Interventions   Financial Strain Interventions Intervention Not Indicated  Stress Interventions Other (Comment)  [consider alternative pharmacotherapy]       CCM Care Plan  No Known Allergies  Medications Reviewed Today     Reviewed by De Hollingshead, RPH-CPP (Pharmacist) on 03/12/21 at [phone]  Med List Status:  <None>   Medication Order Taking? Sig Documenting Provider Last Dose Status Informant  acetaminophen (TYLENOL) 500 MG tablet [id number] Yes Take 1,000 mg by mouth every 6 (six) hours as needed for mild pain or headache.  [provider] Taking Active Self           Med Note [name] Sep 25, 2020 10:02 AM)    ALPRAZolam Duanne Moron) 0.5 MG tablet [id number] Yes TAKE 1 TABLET(0.5 MG) BY MOUTH DAILY AS NEEDED FOR ANXIETY [name], MD Taking Active            Med Note De Hollingshead   Tue Mar 12, 2021  9:57 AM) ~ daily recently  amLODipine (NORVASC) 5 MG tablet [id number] Yes TAKE 1 TABLET(5 MG) BY MOUTH DAILY [name], MD Taking Active   aspirin 81 MG chewable tablet [id number] Yes Chew 81 mg by mouth at bedtime.  [provider] Taking Active Self  atorvastatin (LIPITOR) 20 MG tablet [id number] Yes TAKE 1 TABLET(20 MG) BY MOUTH DAILY [name], MD Taking Active   Cholecalciferol (VITAMIN D3) 2000 UNITS TABS [id number] Yes Take 2,000 Units by mouth daily after supper.  [provider] Taking Active Self  clopidogrel (PLAVIX) 75 MG tablet [id number] Yes TAKE 1 TABLET(75 MG) BY MOUTH DAILY [name], MD Taking Active   Continuous Blood Gluc Sensor (FREESTYLE LIBRE 2 SENSOR) [place] [id number] Yes Use to check sugar at least 4 times daily [name], MD Taking Active   gabapentin (NEURONTIN) 800 MG tablet [id number] Yes Take 1 tablet (800 mg total) by mouth 3 (three) times daily. [name], MD Taking Active   glucose blood (ACCU-CHEK AVIVA PLUS) test strip [id number] Yes TEST BLOOD SUGAR FOUR TIMES DAILY [name], MD Taking Active   insulin lispro (HUMALOG) 100 UNIT/ML KwikPen [id number] No INJECT 10 UNITS UNDER THE SKIN THREE TIMES DAILY BEFORE MEALS  Patient not taking: No sig reported   [provider] Not Taking Active   Insulin Pen Needle (PEN NEEDLES) 32G X 4 MM MISC [id number] Yes Use to take insulin daily [name], MD Taking Active   JARDIANCE 25 MG TABS tablet [id number] Yes TAKE 1 TABLET(25 MG) BY MOUTH DAILY BEFORE BREAKFAST [name], MD Taking Active   Lancets Misc. [place] [id number] Yes Use to check blood sugars 4 times daily [name], MD Taking Active   losartan (COZAAR) 50 MG tablet [id number] Yes TAKE 1 TABLET(50 MG) BY MOUTH DAILY [name], MD Taking Active   Multiple Vitamin (MULTIVITAMIN) tablet [id number] Yes Take 1 tablet by mouth daily. [provider] Taking Active   multivitamin-lutein [facility]) CAPS capsule [id number] Yes Take 1 capsule by mouth daily. [provider]  Taking Active   OZEMPIC, 1 MG/DOSE, 4 MG/3ML SOPN [id number] Yes INJECT 1 MG UNDER THE SKIN ONCE A WEEK [name], MD Taking Active   TRESIBA FLEXTOUCH 100 UNIT/ML FlexTouch Pen [id number] Yes INJECT 64 UNITS UNDER THE SKIN EVERY DAY [name], MD Taking Active            Med Note [name] Mar 12, 2021  9:56 AM) 20-25 units            Patient Active Problem List   Diagnosis Date Noted   Atherosclerosis of artery of extremity with ulceration ([place]) 07/06/2020   Anxiety state 04/01/2017   Atherosclerosis of native arteries of extremity with intermittent claudication ([place]) 01/18/2017   Carotid stenosis 12/04/2016   Insomnia secondary to anxiety 02/21/2016   Thyroid nodule 02/21/2016   Hospital discharge follow-up 11/11/2015   Encounter for preventive health examination 08/02/2015   S/p nephrectomy 05/15/2015   Diabetic nephropathy with proteinuria ([place]) 01/27/2015   Overweight (BMI 25.0-29.9) 10/07/2014   History of renal cell carcinoma 09/28/2014   CAD (coronary artery disease) 08/24/2014   Hernia of abdominal cavity 08/10/2014   Hyperlipidemia associated with type 2 diabetes mellitus ([place]) 06/17/2014   S/P carotid endarterectomy 01/22/2014   History of shingles 05/18/2013   Preoperative evaluation of a medical condition to rule out  surgical contraindications (TAR required) 04/06/2013   Diabetic peripheral neuropathy associated with type 2 diabetes mellitus ([place]) 04/06/2013   Chronic hip pain 01/16/2013   Routine general medical examination at a health care facility 06/26/2012   PVD (peripheral vascular disease) ([place])    History of tobacco abuse 04/29/2011   Controlled type 2 DM with peripheral circulatory disorder ([place])    Hypertension    Hemorrhoid     Immunization History  Administered Date(s) Administered   Influenza Split 03/16/2012   Influenza,inj,Quad PF,6+ Mos 04/06/2013, 03/14/2014, 03/27/2017, 03/25/2018, 05/11/2019, 03/30/2020   Influenza-Unspecified 04/02/2015, 03/14/2016   Moderna Sars-Covid-2 Vaccination 09/28/2019, 10/26/2019, 05/29/2020, 10/29/2020   Pneumococcal Conjugate-13 06/15/2014   Pneumococcal Polysaccharide-23 07/07/2010   Tdap 03/16/2012    Conditions to be addressed/monitored: HTN, HLD, and DMII  Care Plan : Medication Management  Updates made by De Hollingshead, RPH-CPP since 03/12/2021 12:00 AM     Problem: Diabetes, HTN, HLD, CKD, CAD      Long-Range Goal: Disease Progression Prevention   This Visit's Progress: On track  Recent Progress: On track  Priority: High  Note:   Current Barriers:  Unable to achieve control of diabetes  Suboptimal therapeutic regimen for diabetes  Pharmacist Clinical Goal(s):  Over the next 90 days, patient will achieve control of diabetes as evidenced by A1c  through collaboration with PharmD and provider.   Interventions: 1:1 collaboration with [name], MD regarding development and update of comprehensive plan of care as evidenced by provider attestation and co-signature Inter-disciplinary care team collaboration (see longitudinal plan of care) Comprehensive medication review performed; medication list updated in electronic medical record  Health Maintenance Yearly diabetic eye exam: up to date Yearly diabetic foot exam: up to  date Urine microalbumin: up to date Yearly influenza vaccination: due Td/Tdap vaccination: up to date Pneumonia vaccination: due - discuss w/ PCP moving forward COVID vaccinations: due - follow for availability of updated formulation of boosters Shingrix vaccinations: due - will discuss moving forward  Colonoscopy: up to date Bone density scan: up to date Mammogram: up to date  Diabetes: Uncontrolled but more relaxed goal to prevent hypoglycemia  likely appropriate; current treatment: Ozempic 1 mg weekly, Tresiba 20-25 units daily, Jardiance 25 mg daily; Novolog PRN - not requiring use recently Resistant to metformin d/t things she has heard about lawsuits.  Denies any GI side effects. Weight has been stable at 145-146 lbs Current glucose readings: using Libre 2 Date of Download: 8/24-03/12/21 % Time CGM is active: 97% Average Glucose: 137 mg/dL Glucose Management Indicator: 6.6  Glucose Variability: 26 (goal <36%) Time in Goal:  - Time in range 70-180: 85% - Time above range: 14% - Time below range: 1% Current meal patterns: breakfast: banana, coffee; lunch: sandwich, leftovers from night before (spaghetti, piece of chicken w/ potato salad, sides); shredded lettuce, diced pears (1/2 cup pre-packaged); supper: meat + sides, but being focused on portions; drinks: coffee or diet soda;  Discussed that a regular dose of Tyler Aas would be more appropriate due to pharmacokinetics (no benefit to adjusting dose day to day due to anticipated meal intake). Recommend reducing dose to 22 units consistently. Patient verbalizes understanding F/u with PCP later this month as scheduled.   Hypertension: Controlled per last office reading; current treatment: amlodipine 5 mg daily, losartan 50 mg daily  Current home readings: reports generally ~110s/60s, though sometimes lower at home. Denies any episodes of lightheadedness or other symptoms of hypotension, but there is a documented BP of 77/42 at most  recent Vascular appointment.  Decrease amlodipine to 2.5 mg daily. Continue losartan 50 mg daily. Encouraged to check BP daily and document readings between now and next PCP appointment. Discussed improved accuracy of arm cuffs over wrist cuffs  Hyperlipidemia and secondary ASCVD risk reduction (severe PVD) Controlled per last lipid panel at a goal of <55; current treatment: atorvastatin 20 mg daily  Antiplatelet regimen: aspirin 81 mg daily, clopidogrel 75 mg daily  S/p L lower extremity revascularization. Continue to follow with Vascular.  Recommended to continue current regimen at this time  Anxiety/Insomnia: Moderately well managed at this time; reports increased stress related to her son's relationship and recent passing of his father (her ex-husband); Current regimen: alprazolam 0.5 mg QPM PRN - using almost daily recently Previously counseled on long term risks of benzodiazepines. Continue current regimen at this time.  Consider initiation of daily maintenance antianxiety medication given frequency of use of benzodiazepine. Follow up with PCP later this month as scheduled.   Peripheral Neuropathy: Moderately well controlled; current regimen: gabapentin 800 mg TID Max labeled dose per renal function is 1800 mg daily, but patient denies sedation, lower extremity edema. Appropriate to continue current regimen at this time given severity of vascular disease and benefit of pain control outweighing risk of side effects.  Previously recommended to continue current regimen as above.   Supplements: Vitamin D daily, multivitamin daily, Ocuvite, Biotin  Patient Goals/Self-Care Activities Over the next 90 days, patient will:  - take medications as prescribed check glucose three times daily using CGM, document, and provide at future appointments check blood pressure periodically, document, and provide at future appointments  Follow Up Plan: Telephone follow up appointment with care management  team member scheduled for: ~ 12 weeks       Medication Assistance: None required.  Patient affirms current coverage meets needs.  Patient's preferred pharmacy is:  [id number] [address], [place] [address] Phone: [phone] Fax: [phone]   Follow Up:  Patient agrees to Care Plan and Follow-up.  Plan:  Telephone follow up appointment with care management team member scheduled for:  ~ 12 weeks  [name], PharmD, [place], [place] Clinical Pharmacist [employer] [facility] [employer] [phone]

## 2021-03-12 NOTE — Patient Instructions (Signed)
It was great talking to you today!  I recommend consistently using Tresiba 22 units daily. Since this medication is very long lasting, adjusting the dose on a day to day basis is not the best way to use it.   Continue Ozempic 1 mg weekly and Jardinace 25 mg daily. Bring your Shannon reader with you to your next appointment with Dr. Derrel Nip.    Decrease amlodipine to 2.5 mg daily. Continue losartan 50 mg daily. Please check your blood pressure daily - alternating between different times of the day. Write down these readings and bring with you to your next appointment with Dr. Derrel Nip.   Take care!  [name], PharmD [phone]  Visit Information  PATIENT GOALS:  Goals Addressed               This Visit's Progress     Patient Stated     Medication Monitoring (pt-stated)        Patient Goals/Self-Care Activities Over the next 90 days, patient will:  - take medications as prescribed check glucose three times daily using CGM, document, and provide at future appointments check blood pressure periodically, document, and provide at future appointments          The patient verbalized understanding of instructions, educational materials, and care plan provided today and agreed to receive a mailed copy of patient instructions, educational materials, and care plan.   Plan: Telephone follow up appointment with care management team member scheduled for:  ~ 12 weeks  [name], PharmD, [place], [place] Clinical Pharmacist [employer] at [employer] [phone]

## 2021-03-21 ENCOUNTER — Ambulatory Visit (INDEPENDENT_AMBULATORY_CARE_PROVIDER_SITE_OTHER): Payer: Medicare Other

## 2021-03-21 ENCOUNTER — Ambulatory Visit (INDEPENDENT_AMBULATORY_CARE_PROVIDER_SITE_OTHER): Payer: Medicare Other | Admitting: Vascular Surgery

## 2021-03-21 ENCOUNTER — Other Ambulatory Visit: Payer: Self-pay

## 2021-03-21 VITALS — BP 92/55 | HR 71 | Ht 67.0 in | Wt 142.0 lb

## 2021-03-21 DIAGNOSIS — I6523 Occlusion and stenosis of bilateral carotid arteries: Secondary | ICD-10-CM | POA: Diagnosis not present

## 2021-03-21 DIAGNOSIS — I739 Peripheral vascular disease, unspecified: Secondary | ICD-10-CM

## 2021-03-21 DIAGNOSIS — I70213 Atherosclerosis of native arteries of extremities with intermittent claudication, bilateral legs: Secondary | ICD-10-CM

## 2021-03-21 DIAGNOSIS — E1151 Type 2 diabetes mellitus with diabetic peripheral angiopathy without gangrene: Secondary | ICD-10-CM

## 2021-03-21 DIAGNOSIS — I25118 Atherosclerotic heart disease of native coronary artery with other forms of angina pectoris: Secondary | ICD-10-CM

## 2021-03-21 DIAGNOSIS — I10 Essential (primary) hypertension: Secondary | ICD-10-CM | POA: Diagnosis not present

## 2021-03-22 DIAGNOSIS — E113512 Type 2 diabetes mellitus with proliferative diabetic retinopathy with macular edema, left eye: Secondary | ICD-10-CM | POA: Diagnosis not present

## 2021-03-24 ENCOUNTER — Encounter (INDEPENDENT_AMBULATORY_CARE_PROVIDER_SITE_OTHER): Payer: Self-pay | Admitting: Vascular Surgery

## 2021-03-24 NOTE — Progress Notes (Signed)
MRN : [id number]  Laura Reed is a 60 y.o. (Dec 20, 1960) female who presents with chief complaint of check left leg.  History of Present Illness:   The patient returns to the office for followup and review status post angiogram with intervention on 12/04/2020.   Procedure: Percutaneous transluminal angioplasty and stent placement left popliteal arteries to 6 mm 2.   Percutaneous transluminal angioplasty left anterior tibial to 2.5 mm 3.   Mechanical thrombectomy using the R[place] Rex catheter left popliteal artery. 4.   Mechanical thrombectomy using the penumbra CAT 6 device left anterior tibial artery.  The patient notes improvement in the lower extremity symptoms. No interval shortening of the patient's claudication distance or rest pain symptoms. Previous wounds have now healed.  No new ulcers or wounds have occurred since the last visit.  There have been no significant changes to the patient's overall health care.  The patient denies amaurosis fugax or recent TIA symptoms. There are no recent neurological changes noted. The patient denies history of DVT, PE or superficial thrombophlebitis. The patient denies recent episodes of angina or shortness of breath.   ABI's Rt=1.02 and Lt=1.06  (previous ABI's Rt=1.11 and Lt=1.15) Duplex US of the left lower extremity arterial system shows patent bypass and stent   Current Meds  Medication Sig   acetaminophen (TYLENOL) 500 MG tablet Take 1,000 mg by mouth every 6 (six) hours as needed for mild pain or headache.    ALPRAZolam (XANAX) 0.5 MG tablet TAKE 1 TABLET(0.5 MG) BY MOUTH DAILY AS NEEDED FOR ANXIETY   amLODipine (NORVASC) 2.5 MG tablet Take 1 tablet (2.5 mg total) by mouth daily.   aspirin 81 MG chewable tablet Chew 81 mg by mouth at bedtime.    atorvastatin (LIPITOR) 20 MG tablet TAKE 1 TABLET(20 MG) BY MOUTH DAILY   BIOTIN PO Take 1 tablet by mouth daily.   Cholecalciferol (VITAMIN D3) 2000 UNITS TABS Take 2,000 Units by mouth  daily after supper.    clopidogrel (PLAVIX) 75 MG tablet TAKE 1 TABLET(75 MG) BY MOUTH DAILY   Continuous Blood Gluc Sensor (FREESTYLE LIBRE 2 SENSOR) MISC Use to check sugar at least 4 times daily   gabapentin (NEURONTIN) 800 MG tablet Take 1 tablet (800 mg total) by mouth 3 (three) times daily.   glucose blood (ACCU-CHEK AVIVA PLUS) test strip TEST BLOOD SUGAR FOUR TIMES DAILY   insulin degludec (TRESIBA FLEXTOUCH) 100 UNIT/ML FlexTouch Pen Inject 22 Units into the skin daily.   insulin lispro (HUMALOG) 100 UNIT/ML KwikPen INJECT 10 UNITS UNDER THE SKIN THREE TIMES DAILY BEFORE MEALS   Insulin Pen Needle (PEN NEEDLES) 32G X 4 MM MISC Use to take insulin daily   JARDIANCE 25 MG TABS tablet TAKE 1 TABLET(25 MG) BY MOUTH DAILY BEFORE BREAKFAST   Lancets Misc. MISC Use to check blood sugars 4 times daily   losartan (COZAAR) 50 MG tablet TAKE 1 TABLET(50 MG) BY MOUTH DAILY   Multiple Vitamin (MULTIVITAMIN) tablet Take 1 tablet by mouth daily.   multivitamin-lutein (OCUVITE-LUTEIN) CAPS capsule Take 1 capsule by mouth daily.   OZEMPIC, 1 MG/DOSE, 4 MG/3ML SOPN INJECT 1 MG UNDER THE SKIN ONCE A WEEK    Past Medical History:  Diagnosis Date   Absence of kidney    left   Anxiety    Arthritis    Bladder cancer (HCC)    CHF (congestive heart failure) (HCC)    Complication of anesthesia    BP HAS  RUN LOW AFTER  SURGERY-LUNGS FILLED UP WITH FLUID AFTER  LEG STENT SURGERY    Coronary artery disease    Diabetes mellitus    Family history of adverse reaction to anesthesia    Sister - PONV   GERD (gastroesophageal reflux disease)    OCC TUMS   Heart murmur    Hemorrhoid    History of methicillin resistant staphylococcus aureus (MRSA) 2007   Hypertension    Neuropathy    PVD (peripheral vascular disease) (HCC)    Thyroid nodule    right   Urothelial carcinoma of kidney ([place]) 10/31/2014   INVASIVE UROTHELIAL CARCINOMA, LOW GRADE. T1, Nx.   Wears dentures    full upper and lower    Past  Surgical History:  Procedure Laterality Date   AMPUTATION TOE     right (4th and 5th); left (great toe, 3rd)   AMPUTATION TOE Right 07/16/2018   Procedure: AMPUTATION TOE/MPJ right 2nd;  Surgeon: Sharlotte Alamo, DPM;  Location: [facility];  Service: Podiatry;  Laterality: Right;   ARTERIAL BYPASS SURGRY  2009, 2013 x 2   right leg , done in [place] Right 01/2014   Dr Delana Meyer   CATARACT EXTRACTION W/PHACO Right 12/14/2014   Procedure: CATARACT EXTRACTION PHACO AND INTRAOCULAR LENS PLACEMENT ([place]);  Surgeon: Lyla Glassing, MD;  Location: [facility];  Service: Ophthalmology;  Laterality: Right;  [place]   00:38.6              AP        7.1                   CDE  2.76   CATARACT EXTRACTION W/PHACO Left 12/06/2019   Procedure: CATARACT EXTRACTION PHACO AND INTRAOCULAR LENS PLACEMENT ([place]) LEFT DIABETIC;  Surgeon: Birder Robson, MD;  Location: [place];  Service: Ophthalmology;  Laterality: Left;  9.08 1:06.4   CESAREAN SECTION     CHOLECYSTECTOMY  03-03-12   Porcelain gallbladder, gallstones,  [place]   COLONOSCOPY W/ BIOPSIES  04/28/2012   Hyperplastic rectal polyps.   CORONARY ARTERY BYPASS GRAFT  2009   3 vessel   CYSTOSCOPY W/ RETROGRADES Right 09/01/2016   Procedure: CYSTOSCOPY WITH RETROGRADE PYELOGRAM;  Surgeon: Hollice Espy, MD;  Location: [facility];  Service: Urology;  Laterality: Right;   CYSTOSCOPY W/ RETROGRADES Bilateral 03/19/2020   Procedure: CYSTOSCOPY WITH RETROGRADE PYELOGRAM;  Surgeon: Hollice Espy, MD;  Location: [facility];  Service: Urology;  Laterality: Bilateral;   CYSTOSCOPY WITH BIOPSY N/A 03/19/2020   Procedure: CYSTOSCOPY WITH BIOPSY;  Surgeon: Hollice Espy, MD;  Location: [facility];  Service: Urology;  Laterality: N/A;   EYE SURGERY     HERNIA REPAIR  10-31-14   ventral, retro-rectus atrium mesh   IRRIGATION AND DEBRIDEMENT FOOT Left 01/18/2019   Procedure: IRRIGATION AND DEBRIDEMENT FOOT;  Surgeon: Sharlotte Alamo, DPM;  Location: [facility];  Service: Podiatry;  Laterality: Left;   LOWER EXTREMITY ANGIOGRAPHY Left 12/10/2016   Procedure: Lower Extremity Angiography;  Surgeon: Katha Cabal, MD;  Location: [place] [facility];  Service: Cardiovascular;  Laterality: Left;   LOWER EXTREMITY ANGIOGRAPHY Left 02/02/2018   Procedure: LOWER EXTREMITY ANGIOGRAPHY;  Surgeon: Katha Cabal, MD;  Location: [place] [facility];  Service: Cardiovascular;  Laterality: Left;   LOWER EXTREMITY ANGIOGRAPHY Left 05/05/2018   Procedure: LOWER EXTREMITY ANGIOGRAPHY;  Surgeon: Katha Cabal, MD;  Location: [place] [facility];  Service: Cardiovascular;  Laterality: Left;  LOWER EXTREMITY ANGIOGRAPHY Left 12/04/2020   Procedure: LOWER EXTREMITY ANGIOGRAPHY with Intervention;  Surgeon: Katha Cabal, MD;  Location: [facility];  Service: Cardiovascular;  Laterality: Left;   NEPHRECTOMY Left 10-31-14   PERIPHERAL VASCULAR CATHETERIZATION Left 05/01/2015   Procedure: Lower Extremity Angiography;  Surgeon: Katha Cabal, MD;  Location: [place] [facility];  Service: Cardiovascular;  Laterality: Left;   PERIPHERAL VASCULAR CATHETERIZATION  05/01/2015   Procedure: Lower Extremity Intervention;  Surgeon: Katha Cabal, MD;  Location: [place] [facility];  Service: Cardiovascular;;   PERIPHERAL VASCULAR CATHETERIZATION Left 02/20/2015   Procedure: Pelvic Angiography;  Surgeon: Katha Cabal, MD;  Location: [place];  Service: Cardiovascular;  Laterality: Left;   TRANSURETHRAL RESECTION OF BLADDER TUMOR WITH MITOMYCIN-C N/A 09/01/2016   Procedure: TRANSURETHRAL RESECTION OF BLADDER TUMOR WITH MITOMYCIN-C;  Surgeon: Hollice Espy, MD;  Location: [facility];  Service: Urology;  Laterality: N/A;    Social History Social History   Tobacco Use   Smoking status: Former    Packs/day: 2.00    Years: 35.00    Pack years: 70.00    Types: Cigarettes    Quit date: 03/29/2013    Years  since quitting: 7.9   Smokeless tobacco: Never  Vaping Use   Vaping Use: Former  Substance Use Topics   Alcohol use: Not Currently    Alcohol/week: 0.0 standard drinks    Comment: LAST DRINK 2009   Drug use: Not Currently    Types: Cocaine    Comment: last used in 2007    Family History Family History  Problem Relation Age of Onset   Cancer Mother 32       Lung Cancer   Cancer Father 25       Lung Ca   Diabetes Son    Breast cancer Maternal Grandmother    Kidney cancer Neg Hx    Bladder Cancer Neg Hx    Prostate cancer Neg Hx     No Known Allergies   REVIEW OF SYSTEMS (Negative unless checked)  Constitutional: '[]'$ Weight loss  '[]'$ Fever  '[]'$ Chills Cardiac: '[]'$ Chest pain   '[]'$ Chest pressure   '[]'$ Palpitations   '[]'$ Shortness of breath when laying flat   '[]'$ Shortness of breath with exertion. Vascular:  '[]'$ Pain in legs with walking   '[]'$ Pain in legs at rest  '[]'$ History of DVT   '[]'$ Phlebitis   '[]'$ Swelling in legs   '[]'$ Varicose veins   '[]'$ Non-healing ulcers Pulmonary:   '[]'$ Uses home oxygen   '[]'$ Productive cough   '[]'$ Hemoptysis   '[]'$ Wheeze  '[]'$ COPD   '[]'$ Asthma Neurologic:  '[]'$ Dizziness   '[]'$ Seizures   '[]'$ History of stroke   '[]'$ History of TIA  '[]'$ Aphasia   '[]'$ Vissual changes   '[]'$ Weakness or numbness in arm   '[]'$ Weakness or numbness in leg Musculoskeletal:   '[]'$ Joint swelling   '[]'$ Joint pain   '[]'$ Low back pain Hematologic:  '[]'$ Easy bruising  '[]'$ Easy bleeding   '[]'$ Hypercoagulable state   '[]'$ Anemic Gastrointestinal:  '[]'$ Diarrhea   '[]'$ Vomiting  '[]'$ Gastroesophageal reflux/heartburn   '[]'$ Difficulty swallowing. Genitourinary:  '[]'$ Chronic kidney disease   '[]'$ Difficult urination  '[]'$ Frequent urination   '[]'$ Blood in urine Skin:  '[]'$ Rashes   '[]'$ Ulcers  Psychological:  '[]'$ History of anxiety   '[]'$  History of major depression.  Physical Examination  Vitals:   03/21/21 1403  BP: (!) 92/55  Pulse: 71  Weight: 142 lb (64.4 kg)  Height: '5\' 7"'$  (1.702 m)   Body mass index is 22.24 kg/m. Gen: WD/WN, NAD Head: [place]/AT, No temporalis wasting.  Ear/Nose/Throat: Hearing grossly intact, nares w/o erythema or drainage Eyes: PER, EOMI, sclera nonicteric.  Neck: Supple, no masses.  No bruit or JVD.  Pulmonary:  Good air movement, no audible wheezing, no use of accessory muscles.  Cardiac: RRR, normal S1, S2, no Murmurs. Vascular:   Vessel Right Left  Radial Palpable Palpable  PT Not Palpable Not Palpable  DP Not Palpable Not Palpable  Gastrointestinal: soft, non-distended. No guarding/no peritoneal signs.  Musculoskeletal: M/S 5/5 throughout.  No visible deformity.  Neurologic: CN 2-12 intact. Pain and light touch intact in extremities.  Symmetrical.  Speech is fluent. Motor exam as listed above. Psychiatric: Judgment intact, Mood & affect appropriate for pt's clinical situation. Dermatologic: No rashes or ulcers noted.  No changes consistent with cellulitis.   CBC Lab Results  Component Value Date   WBC 9.2 03/15/2020   HGB 13.6 03/15/2020   HCT 37.4 03/15/2020   MCV 81.3 03/15/2020   PLT 274 03/15/2020    BMET    Component Value Date/Time   NA 137 11/15/2020 1016   NA 135 11/02/2014 0609   K 4.3 11/15/2020 1016   K 4.2 11/02/2014 0609   CL 100 11/15/2020 1016   CL 107 11/02/2014 0609   CO2 30 11/15/2020 1016   CO2 23 11/02/2014 0609   GLUCOSE 147 (H) 11/15/2020 1016   GLUCOSE 108 (H) 11/02/2014 0609   BUN 22 12/04/2020 0921   BUN 20 11/02/2014 0609   CREATININE 0.99 12/04/2020 0921   CREATININE 1.01 11/09/2015 1549   CREATININE 1.01 11/09/2015 1549   CALCIUM 9.7 11/15/2020 1016   CALCIUM 7.3 (L) 11/02/2014 0609   GFRNONAA >60 12/04/2020 0921   GFRNONAA 50 (L) 11/02/2014 0609   GFRAA >60 01/19/2019 0357   GFRAA 58 (L) 11/02/2014 0609   CrCl cannot be calculated (Patient's most recent lab result is older than the maximum 21 days allowed.).  COAG Lab Results  Component Value Date   INR 0.9 10/17/2014   INR 1.1 01/19/2014   INR 0.9 12/06/2013    Radiology VAS [place] ABI WITH/WO TBI  Result Date:  03/21/2021  LOWER EXTREMITY DOPPLER STUDY Patient Name:  Laura Reed  Date of Exam:   03/21/2021 Medical Rec #: [id number]        Accession #:    [id number] Date of Birth: [birth year]        Patient Gender: F Patient Age:   64 years Exam Location:  [facility] Procedure:      VAS [place] ABI WITH/WO TBI Referring Phys: Eulogio Ditch --------------------------------------------------------------------------------  Indications: Peripheral artery disease, and ASO S/P Intervention. Other Factors: H/O right 4th & 5th and left 1st & 3rd toe amputations                12/03/2020 left pop thrombectomy and new stent.  Vascular Interventions: H/O left-to-right fem-fem BPG with lower extremity                         stents & BPGs in [place];                         10/11/09: Left external iliac/common femoral artery PTA;                         07/25/11: Right profunda femoral to TP trunk BPG;  12/31/11: Revision of fem-fem BPG & redo of anastomosis;                         09/16/12: Fem-fem BPG, left external iliac, left common                         femoral & left profunda femoral artery PTAs;                         06/01/13: Left external iliac to profunda femoral BPG                         with revision of the fem-fem & left fem-AK popliteal BPG                         proximal anastomoses;                         08/02/13: Left distal SFA/popliteal artery PTA;                         02/20/15: Left external iliac artery PTA/stent with left                         common iliac artery PTA;                         12/10/16: Left external iliac & common femoral artery                         PTAs. Performing Technologist: Concha [place] RVT  Examination Guidelines: A complete evaluation includes at minimum, Doppler waveform signals and systolic blood pressure reading at the level of bilateral brachial, anterior tibial, and posterior tibial arteries, when vessel segments are accessible. Bilateral testing  is considered an integral part of a complete examination. Photoelectric Plethysmograph (PPG) waveforms and toe systolic pressure readings are included as required and additional duplex testing as needed. Limited examinations for reoccurring indications may be performed as noted.  ABI Findings: +---------+------------------+-----+--------+--------+ Right    Rt Pressure (mmHg)IndexWaveformComment  +---------+------------------+-----+--------+--------+ Brachial 116                                     +---------+------------------+-----+--------+--------+ ATA      126                    biphasic1.02     +---------+------------------+-----+--------+--------+ PTA      119               0.98 biphasic         +---------+------------------+-----+--------+--------+ Great Toe88                0.72 Normal           +---------+------------------+-----+--------+--------+ +--------+------------------+-----+--------+-------+ Left    Lt Pressure (mmHg)IndexWaveformComment +--------+------------------+-----+--------+-------+ [id number]                                    +--------+------------------+-----+--------+-------+ ATA     129  biphasic1.06    +--------+------------------+-----+--------+-------+ PTA     120               0.98 biphasic        +--------+------------------+-----+--------+-------+ +-------+-----------+-----------+------------+------------+ ABI/TBIToday's ABIToday's TBIPrevious ABIPrevious TBI +-------+-----------+-----------+------------+------------+ Right  1.02       .72        1.11        .77          +-------+-----------+-----------+------------+------------+ Left   1.06       amp        1.15        amp          +-------+-----------+-----------+------------+------------+ Bilateral ABIs and TBIs appear essentially unchanged compared to prior study on 12/19/2020.  Summary: Right: Resting right ankle-brachial index is within  normal range. No evidence of significant right lower extremity arterial disease. The right toe-brachial index is normal. Left: Resting left ankle-brachial index is within normal range. No evidence of significant left lower extremity arterial disease. Lt great toe amputated.  *See table(s) above for measurements and observations.  Electronically signed by Hortencia Pilar MD on 03/21/2021 at 6:58:40 PM.    Final    VAS [place] LOWER EXTREMITY ARTERIAL DUPLEX  Result Date: 03/21/2021 LOWER EXTREMITY ARTERIAL DUPLEX STUDY Patient Name:  Laura Reed  Date of Exam:   03/21/2021 Medical Rec #: [id number]        Accession #:    [id number] Date of Birth: July 19, 1960        Patient Gender: F Patient Age:   55 years Exam Location:  [facility] Procedure:      VAS [place] LOWER EXTREMITY ARTERIAL DUPLEX Referring Phys: Eulogio Ditch --------------------------------------------------------------------------------  Indications: Peripheral artery disease, and ASO S/P Intervention. Other Factors: H/O right 4th & 5th and left 1st & 3rd toe amputations                12/03/2020 left pop thrombectomy and new stent.  Vascular Interventions: H/O left-to-right fem-fem BPG with lower extremity                         stents & BPGs in [place];                         10/11/09: Left external iliac/common femoral artery PTA;                         07/25/11: Right profunda femoral to TP trunk BPG;                         12/31/11: Revision of fem-fem BPG & redo of anastomosis;                         09/16/12: Fem-fem BPG, left external iliac, left common                         femoral & left profunda femoral artery PTAs;                         06/01/13: Left external iliac to profunda femoral BPG                         with revision of the fem-fem & left fem-AK  popliteal BPG                         proximal anastomoses;                         08/02/13: Left distal SFA/popliteal artery PTA;                         02/20/15: Left external  iliac artery PTA/stent with left                         common iliac artery PTA;                         12/10/16: Left external iliac & common femoral artery                         PTAs. Current ABI:            rt = 1.02 lt = 1.06 Comparison Study: 10/2020 Performing Technologist: Concha Norway RVT  Examination Guidelines: A complete evaluation includes B-mode imaging, spectral Doppler, color Doppler, and power Doppler as needed of all accessible portions of each vessel. Bilateral testing is considered an integral part of a complete examination. Limited examinations for reoccurring indications may be performed as noted.   +----------+--------+-----+--------+----------+--------+ LEFT      PSV cm/sRatioStenosisWaveform  Comments +----------+--------+-----+--------+----------+--------+ CFA Mid   84                   monophasic         +----------+--------+-----+--------+----------+--------+ DFA       44                   monophasic         +----------+--------+-----+--------+----------+--------+ SFA Prox  74                   monophasic         +----------+--------+-----+--------+----------+--------+ SFA Mid   84                   monophasic         +----------+--------+-----+--------+----------+--------+ SFA Distal92                   monophasic         +----------+--------+-----+--------+----------+--------+ POP Distal49                   biphasic  stent    +----------+--------+-----+--------+----------+--------+ ATA Distal55                   monophasic         +----------+--------+-----+--------+----------+--------+ PTA Distal19                   monophasic         +----------+--------+-----+--------+----------+--------+  Summary: Left: Patent throughout. Mild atherosclerosis.  See table(s) above for measurements and observations. Electronically signed by Hortencia Pilar MD on 03/21/2021 at 6:58:29 PM.    Final      Assessment/Plan 1. Atherosclerosis  of native artery of both lower extremities with intermittent claudication (HCC) Recommend:  The patient is status post successful angiogram with intervention.  The patient reports that the claudication symptoms and leg pain is essentially gone.   The patient denies lifestyle limiting changes at this point  in time.  No further invasive studies, angiography or surgery at this time The patient should continue walking and begin a more formal exercise program.  The patient should continue antiplatelet therapy and aggressive treatment of the lipid abnormalities  Smoking cessation was again discussed  The patient should continue wearing graduated compression socks 10-15 mmHg strength to control the mild edema.  Patient should undergo noninvasive studies as ordered. The patient will follow up with me after the studies.    - VAS [place] LOWER EXTREMITY ARTERIAL DUPLEX; Future - VAS [place] ABI WITH/WO TBI; Future  2. Bilateral carotid artery stenosis Recommend:   Given the patient's asymptomatic subcritical stenosis no further invasive testing or surgery at this time.   Duplex ultrasound shows [id number] and LICA [id number].   Continue antiplatelet therapy as prescribed Continue management of CAD, HTN and Hyperlipidemia Healthy heart diet,  encouraged exercise at least 4 times per week Follow up in 6 months with duplex ultrasound and physical exam   3. Coronary artery disease of native artery of native heart with stable angina pectoris (HCC) Continue cardiac and antihypertensive medications as already ordered and reviewed, no changes at this time.  Continue statin as ordered and reviewed, no changes at this time  Nitrates PRN for chest pain   4. Controlled type 2 DM with peripheral circulatory disorder (HCC) Continue hypoglycemic medications as already ordered, these medications have been reviewed and there are no changes at this time.  Hgb A1C to be monitored as already arranged by primary service    5. Primary hypertension Continue antihypertensive medications as already ordered, these medications have been reviewed and there are no changes at this time.     Hortencia Pilar, MD  03/24/2021 3:25 PM

## 2021-03-26 NOTE — Progress Notes (Signed)
   03/27/21  CC:  Chief Complaint  Patient presents with   Cysto     HPI: Laura Reed is a 60 y.o.female with a personal history of pT1N0 left upper tract urothelial carcinoma, who presents today for a cystoscopy.   She has a history of left upper tract transitional cell carcinoma/ bladder cancer recurrence. She is s/p left robotic nephroureterectomy and combined ventral hernia repair with Dr. Bary Castilla on 10/31/2014.   Recent Surveillance: 02/2015 - cysto NED; 05/2015 cysto NED, 08/2015- cysto NED, 11/2015- NED, CT Urogram 11/12/15 negative, poor quality of delayed phase.; 5/2-17- cysto with mild erythema following recent UTI; 02/2016 NED; 08/2016 Lg Ta TCC recurrence,  1 cm, multifocal.  RTG neg.  08/2016- TURBT for bladder 1 cm recurrence near bladder neck and left UO, multifocal.  R RTG negative. Mitomycin.  LgTa. 10/2016- Induction BCG x 6 08/2017- Maintenance BCG x 3 02/2019- Maintenance BCG x 3 9/21- Cysto with erythema on posterior bladder wall, bx c/w follicular cystitis, R RTG negative  She also has a solitary kidney. S/p left nephrectomy as above. Most recent creatinine on 12/04/2020 was 0.99.   She has an extensive smoking history.   She is doing well today and has no new urinary symptoms.   Vitals:   03/27/21 1113  BP: 138/66  Pulse: 76   NED. A&Ox3.   No respiratory distress   Abd soft, NT, ND Normal external genitalia with patent urethral meatus  Cystoscopy Procedure Note  Patient identification was confirmed, informed consent was obtained, and patient was prepped using Betadine solution.  Lidocaine jelly was administered per urethral meatus.    Procedure: - Flexible cystoscope introduced, without any difficulty.   - Thorough search of the bladder revealed:    normal urethral meatus    normal urothelium withsome vascular changes associated with previous biopsies consistent with scar    no stones    no ulcers     no tumors    no urethral polyps    no  trabeculation  - Right ureteral orifice is normal in position and appearance. Left ureteral orifice surgically absent  Post-Procedure: - Patient tolerated the procedure well  Assessment/ Plan:  1.History of Malignant neoplasm of urinary bladder/ history of upper tract TCC - Last recurrence in 2018  - NED today  -Most recent upper tract imaging in the form of retrograde 03/2020 -Given her extensive history as well as multiple suspicious lesions, she would like to continue cystoscopy on a every 6 month interval which seems appropriate  Return in 6 months for cystoscopy   Conley Rolls as a scribe for Hollice Espy, MD.,have documented all relevant documentation on the behalf of Hollice Espy, MD,as directed by  Hollice Espy, MD while in the presence of Hollice Espy, MD.  I have reviewed the above documentation for accuracy and completeness, and I agree with the above.   Hollice Espy, MD

## 2021-03-27 ENCOUNTER — Ambulatory Visit (INDEPENDENT_AMBULATORY_CARE_PROVIDER_SITE_OTHER): Payer: Medicare Other | Admitting: Urology

## 2021-03-27 ENCOUNTER — Other Ambulatory Visit: Payer: Self-pay

## 2021-03-27 VITALS — BP 138/66 | HR 76

## 2021-03-27 DIAGNOSIS — Z8551 Personal history of malignant neoplasm of bladder: Secondary | ICD-10-CM

## 2021-03-27 DIAGNOSIS — Z7689 Persons encountering health services in other specified circumstances: Secondary | ICD-10-CM | POA: Diagnosis not present

## 2021-03-27 DIAGNOSIS — E118 Type 2 diabetes mellitus with unspecified complications: Secondary | ICD-10-CM | POA: Diagnosis not present

## 2021-03-27 DIAGNOSIS — Z794 Long term (current) use of insulin: Secondary | ICD-10-CM | POA: Diagnosis not present

## 2021-03-28 ENCOUNTER — Encounter: Payer: Self-pay | Admitting: Internal Medicine

## 2021-03-28 ENCOUNTER — Ambulatory Visit (INDEPENDENT_AMBULATORY_CARE_PROVIDER_SITE_OTHER): Payer: Medicare Other | Admitting: Internal Medicine

## 2021-03-28 ENCOUNTER — Other Ambulatory Visit: Payer: Self-pay

## 2021-03-28 VITALS — BP 152/68 | HR 82 | Temp 96.5°F | Ht 67.0 in | Wt 143.0 lb

## 2021-03-28 DIAGNOSIS — R2232 Localized swelling, mass and lump, left upper limb: Secondary | ICD-10-CM | POA: Diagnosis not present

## 2021-03-28 DIAGNOSIS — E1151 Type 2 diabetes mellitus with diabetic peripheral angiopathy without gangrene: Secondary | ICD-10-CM | POA: Diagnosis not present

## 2021-03-28 DIAGNOSIS — I70213 Atherosclerosis of native arteries of extremities with intermittent claudication, bilateral legs: Secondary | ICD-10-CM

## 2021-03-28 DIAGNOSIS — M7989 Other specified soft tissue disorders: Secondary | ICD-10-CM | POA: Diagnosis not present

## 2021-03-28 DIAGNOSIS — F411 Generalized anxiety disorder: Secondary | ICD-10-CM

## 2021-03-28 DIAGNOSIS — Z23 Encounter for immunization: Secondary | ICD-10-CM

## 2021-03-28 LAB — COMPREHENSIVE METABOLIC PANEL
ALT: 24 U/L (ref 0–35)
AST: 20 U/L (ref 0–37)
Albumin: 4.1 g/dL (ref 3.5–5.2)
Alkaline Phosphatase: 67 U/L (ref 39–117)
BUN: 22 mg/dL (ref 6–23)
CO2: 28 mEq/L (ref 19–32)
Calcium: 9.2 mg/dL (ref 8.4–10.5)
Chloride: 100 mEq/L (ref 96–112)
Creatinine, Ser: 0.95 mg/dL (ref 0.40–1.20)
GFR: 64.2 mL/min (ref >60.00)
Glucose, Bld: 145 mg/dL — ABNORMAL HIGH (ref 70–99)
Potassium: 4.1 mEq/L (ref 3.5–5.1)
Sodium: 136 mEq/L (ref 135–145)
Total Bilirubin: 0.4 mg/dL (ref 0.2–1.2)
Total Protein: 6.6 g/dL (ref 6.0–8.3)

## 2021-03-28 LAB — URINALYSIS, COMPLETE
Bilirubin, UA: NEGATIVE
Ketones, UA: NEGATIVE
Leukocytes,UA: NEGATIVE
Nitrite, UA: NEGATIVE
Protein,UA: NEGATIVE
Specific Gravity, UA: <1.005 — ABNORMAL LOW (ref 1.005–1.030)
Urobilinogen, Ur: 0.2 mg/dL (ref 0.2–1.0)
pH, UA: 6 (ref 5.0–7.5)

## 2021-03-28 LAB — MICROSCOPIC EXAMINATION

## 2021-03-28 LAB — HEMOGLOBIN A1C: Hgb A1c MFr Bld: 7.3 % — ABNORMAL HIGH (ref 4.6–6.5)

## 2021-03-28 NOTE — Progress Notes (Signed)
Subjective:  Patient ID: Laura RUNDE, female    DOB: 07-14-1960  Age: 60 y.o. MRN: [id number]  CC: The primary encounter diagnosis was Controlled type 2 DM with peripheral circulatory disorder ([place]). Diagnoses of Localized swelling, mass, or lump of left upper extremity, Need for immunization against influenza, Atherosclerosis of native artery of both lower extremities with intermittent claudication ([place]), Anxiety state, and Left arm swelling were also pertinent to this visit.  HPI JOURNII NIERMAN presents for  Chief Complaint  Patient presents with   Follow-up    3 month follow up on diabetes   This visit occurred during the SARS-CoV-2 public health emergency.  Safety protocols were in place, including screening questions prior to the visit, additional usage of staff PPE, and extensive cleaning of exam room while observing appropriate contact time as indicated for disinfecting solutions.   CC: NEW COMPLAINT OF Persistent swelling ,  painless, WITHOUT  bruising  of left upper medial arm.  Has been present for several months.  No significant change.  There is no history of recent IV placement.  Marland Kitchen    2) Type 2 DM;  taking ozempic 1 mg  weekly  Tresiba dose reduced to 22 units due to low sugars.  Using Jardiance,  has not used novolog in 5 months .  CCM follow up 2 weeks ago  with  CBG download reviewed.  No new readings for today's visit.  Reviewed the diet,  reports frequent starches due to limited finances:  eating spaghetti, mac n cheese.  Diabetic retinopathy: sees porfilio every 5-6 weeks for Avastin njections    3) PAD: saw [name] .recently for surveillance and  Leg circulation is adequate and improved.   4) HTN: home readings 96 to 329 systolic. Taking amlodipine 2.5 mg daily , losartan 50 mg  Has not brought machine in to calibrate yet.    5) Anxiety:  reviewed used of alprazolam (not  daily ) and the need to avoid daily  use to avoid dependence.  Discussed  the ADDITION of SSRI  therapy.  She is contemplative,  recalls prior intolerance due to fatigeuebut cannot remember which medication.   Lab Results  Component Value Date   HGBA1C 7.3 (H) 03/28/2021     Outpatient Medications Prior to Visit  Medication Sig Dispense Refill   acetaminophen (TYLENOL) 500 MG tablet Take 1,000 mg by mouth every 6 (six) hours as needed for mild pain or headache.      ALPRAZolam (XANAX) 0.5 MG tablet TAKE 1 TABLET(0.5 MG) BY MOUTH DAILY AS NEEDED FOR ANXIETY 30 tablet 5   amLODipine (NORVASC) 2.5 MG tablet Take 1 tablet (2.5 mg total) by mouth daily. 90 tablet 1   aspirin 81 MG chewable tablet Chew 81 mg by mouth at bedtime.      atorvastatin (LIPITOR) 20 MG tablet TAKE 1 TABLET(20 MG) BY MOUTH DAILY 90 tablet 1   BIOTIN PO Take 1 tablet by mouth daily.     Cholecalciferol (VITAMIN D3) 2000 UNITS TABS Take 2,000 Units by mouth daily after supper.      clopidogrel (PLAVIX) 75 MG tablet TAKE 1 TABLET(75 MG) BY MOUTH DAILY 90 tablet 1   Continuous Blood Gluc Sensor (FREESTYLE LIBRE 2 SENSOR) MISC Use to check sugar at least 4 times daily 2 each 2   gabapentin (NEURONTIN) 800 MG tablet Take 1 tablet (800 mg total) by mouth 3 (three) times daily. 270 tablet 3   glucose blood (ACCU-CHEK AVIVA PLUS) test strip  TEST BLOOD SUGAR FOUR TIMES DAILY 400 strip 1   insulin degludec (TRESIBA FLEXTOUCH) 100 UNIT/ML FlexTouch Pen Inject 22 Units into the skin daily. 30 mL 2   Insulin Pen Needle (PEN NEEDLES) 32G X 4 MM MISC Use to take insulin daily 100 each 3   JARDIANCE 25 MG TABS tablet TAKE 1 TABLET(25 MG) BY MOUTH DAILY BEFORE BREAKFAST 30 tablet 2   Lancets Misc. MISC Use to check blood sugars 4 times daily 400 each 3   losartan (COZAAR) 50 MG tablet TAKE 1 TABLET(50 MG) BY MOUTH DAILY 90 tablet 3   Multiple Vitamin (MULTIVITAMIN) tablet Take 1 tablet by mouth daily.     multivitamin-lutein (OCUVITE-LUTEIN) CAPS capsule Take 1 capsule by mouth daily.     OZEMPIC, 1 MG/DOSE, 4 MG/3ML SOPN INJECT  1 MG UNDER THE SKIN ONCE A WEEK 3 mL 2   insulin lispro (HUMALOG) 100 UNIT/ML KwikPen INJECT 10 UNITS UNDER THE SKIN THREE TIMES DAILY BEFORE MEALS     No facility-administered medications prior to visit.    Review of Systems;  Patient denies headache, fevers, malaise, unintentional weight loss, skin rash, eye pain, sinus congestion and sinus pain, sore throat, dysphagia,  hemoptysis , cough, dyspnea, wheezing, chest pain, palpitations, orthopnea, edema, abdominal pain, nausea, melena, diarrhea, constipation, flank pain, dysuria, hematuria, urinary  Frequency, nocturia, numbness, tingling, seizures,  Focal weakness, Loss of consciousness,  Tremor, insomnia, depression, anxiety, and suicidal ideation.      Objective:  BP (!) 152/68 (BP Location: Left Arm, Patient Position: Sitting, Cuff Size: Normal)   Pulse 82   Temp (!) 96.5 F (35.8 C) (Temporal)   Ht _0  (1.702 m)   Wt 143 lb (64.9 kg)   SpO2 98%   BMI 22.40 kg/m   BP Readings from Last 3 Encounters:  03/28/21 (!) 152/68  03/27/21 138/66  03/21/21 (!) 92/55    Wt Readings from Last 3 Encounters:  03/28/21 143 lb (64.9 kg)  03/21/21 142 lb (64.4 kg)  12/19/20 146 lb (66.2 kg)    General appearance: alert, cooperative and appears stated age Ears: normal TM's and external ear canals both ears Throat: lips, mucosa, and tongue normal; teeth and gums normal Neck: no adenopathy, no carotid bruit, supple, symmetrical, trachea midline and thyroid not enlarged, symmetric, no tenderness/mass/nodules Back: symmetric, no curvature. ROM normal. No CVA tenderness. Lungs: clear to auscultation bilaterally Heart: regular rate and rhythm, S1, S2 normal, no murmur, click, rub or gallop Abdomen: soft, non-tender; bowel sounds normal; no masses,  no organomegaly Pulses: 2+ and symmetric Skin: Skin color, texture, turgor normal. No rashes or lesions Lymph nodes: Cervical, supraclavicular, and axillary nodes normal.  Lab Results   Component Value Date   HGBA1C 7.3 (H) 03/28/2021   HGBA1C 7.6 (H) 11/15/2020   HGBA1C 7.4 (H) 08/16/2020    Lab Results  Component Value Date   CREATININE 0.95 03/28/2021   CREATININE 0.99 12/04/2020   CREATININE 0.89 11/15/2020    Lab Results  Component Value Date   WBC 9.2 03/15/2020   HGB 13.6 03/15/2020   HCT 37.4 03/15/2020   PLT 274 03/15/2020   GLUCOSE 145 (H) 03/28/2021   CHOL 114 11/15/2020   TRIG 84.0 11/15/2020   HDL 55.30 11/15/2020   LDLDIRECT 47.0 01/28/2017   LDLCALC 42 11/15/2020   ALT 24 03/28/2021   AST 20 03/28/2021   NA 136 03/28/2021   K 4.1 03/28/2021   CL 100 03/28/2021   CREATININE 0.95 03/28/2021  BUN 22 03/28/2021   CO2 28 03/28/2021   TSH 0.96 05/18/2015   INR 0.9 10/17/2014   HGBA1C 7.3 (H) 03/28/2021   MICROALBUR 2.0 (H) 11/15/2020    PERIPHERAL VASCULAR CATHETERIZATION  Result Date: 12/04/2020 See Op Note   Assessment & Plan:   Problem List Items Addressed This Visit       Unprioritized   Anxiety state    Reviewed her use of alprazolam.  She is not using daily based on review of refill history via [place] Controlled substance database ( refilled in April #30 and July #30, none since )  The risks and benefits of benzodiazepine use were reviewed  with patient today including excessive sedation leading to respiratory depression,  impaired thinking/driving, and addiction.  Patient was advised to avoid concurrent use with alcohol, to use medication only as needed and not to share with others  .       Atherosclerosis of native arteries of extremity with intermittent claudication [facility])    She continues regular follow up with Dr Delana Meyer for surveillance of lower extremity PAD. Continue statin plavix and asa       Controlled type 2 DM with peripheral circulatory disorder ([facility]) - Primary    And retinopathy (diabetic) manage with avastin injections by Porfilio  Reduced hypoglycemic episodes since reducing insulin use withTresiba at 22  units, addition of Ozempic  1 mg weekly , Jardiance now at 25 mg daily .  She  has mild microalbuminuria. Patient is tolerating statin therapy for CAD risk reduction and on ACE/ARB for renal protection and hypertension.   Lab Results  Component Value Date   HGBA1C 7.3 (H) 03/28/2021    Lab Results  Component Value Date   LABMICR See below: 03/27/2021   LABMICR See below: 09/19/2020   MICROALBUR 2.0 (H) 11/15/2020   MICROALBUR 18.0 (H) 05/26/2019          Relevant Orders   Hemoglobin A1c (Completed)   Comprehensive metabolic panel (Completed)   Left arm swelling    Appears to be a lipoma,  But given its location over the brachial artery ,  Will send back to her vascular surgeon for doppler.       Other Visit Diagnoses     Localized swelling, mass, or lump of left upper extremity       Relevant Orders   Ambulatory referral to Vascular Surgery   Need for immunization against influenza       Relevant Orders   Flu Vaccine QUAD 34moIM (Fluarix, Fluzone & Alfiuria Quad PF) (Completed)      I spent 40 mintutes dedicated to the care of this patient on the date of this encounter to include pre-visit review of his medical history,  Face-to-face time with the patient , and post visit ordering of testing and therapeutics.  Medications Discontinued During This Encounter  Medication Reason   insulin lispro (HUMALOG) 100 UNIT/ML KwikPen     Follow-up: No follow-ups on file.   TCrecencio Mc MD

## 2021-03-28 NOTE — Patient Instructions (Addendum)
  I want you to start riding a recumbent bike after lunch for 30 minutes. This will lower your blood sugars without having to take more insulin   You need 30 minutes of concentrated effort (walking on treadmill or riding bike)   I will send you back to Dr Nino Parsley office to have your lump looked at    Your blood pressure should not  go below 100 or you will feel bad.  Please bring in your BP machine for an RN visit

## 2021-03-30 DIAGNOSIS — M7989 Other specified soft tissue disorders: Secondary | ICD-10-CM | POA: Insufficient documentation

## 2021-03-30 NOTE — Assessment & Plan Note (Signed)
And retinopathy (diabetic) manage with avastin injections by Porfilio  Reduced hypoglycemic episodes since reducing insulin use withTresiba at 22 units, addition of Ozempic  1 mg weekly , Jardiance now at 25 mg daily .  She  has mild microalbuminuria. Patient is tolerating statin therapy for CAD risk reduction and on ACE/ARB for renal protection and hypertension.   Lab Results  Component Value Date   HGBA1C 7.3 (H) 03/28/2021     Lab Results  Component Value Date   LABMICR See below: 03/27/2021   LABMICR See below: 09/19/2020   MICROALBUR 2.0 (H) 11/15/2020   MICROALBUR 18.0 (H) 05/26/2019

## 2021-03-30 NOTE — Assessment & Plan Note (Signed)
Appears to be a lipoma,  But given its location over the brachial artery ,  Will send back to her vascular surgeon for doppler.

## 2021-03-30 NOTE — Assessment & Plan Note (Signed)
She continues regular follow up with Dr Delana Meyer for surveillance of lower extremity PAD. Continue statin plavix and asa

## 2021-03-30 NOTE — Assessment & Plan Note (Addendum)
Reviewed her use of alprazolam.  She is not using daily based on review of refill history via [place] Controlled substance database ( refilled in April #30 and July #30, none since )  The risks and benefits of benzodiazepine use were reviewed  with patient today including excessive sedation leading to respiratory depression,  impaired thinking/driving, and addiction.  Patient was advised to avoid concurrent use with alcohol, to use medication only as needed and not to share with others  .

## 2021-04-01 ENCOUNTER — Ambulatory Visit: Payer: Medicare Other

## 2021-04-05 DIAGNOSIS — Z794 Long term (current) use of insulin: Secondary | ICD-10-CM

## 2021-04-05 DIAGNOSIS — E1165 Type 2 diabetes mellitus with hyperglycemia: Secondary | ICD-10-CM

## 2021-04-05 DIAGNOSIS — E1142 Type 2 diabetes mellitus with diabetic polyneuropathy: Secondary | ICD-10-CM | POA: Diagnosis not present

## 2021-04-05 DIAGNOSIS — I10 Essential (primary) hypertension: Secondary | ICD-10-CM

## 2021-04-09 ENCOUNTER — Other Ambulatory Visit (INDEPENDENT_AMBULATORY_CARE_PROVIDER_SITE_OTHER): Payer: Self-pay | Admitting: Nurse Practitioner

## 2021-04-09 DIAGNOSIS — R2232 Localized swelling, mass and lump, left upper limb: Secondary | ICD-10-CM

## 2021-04-12 ENCOUNTER — Encounter (INDEPENDENT_AMBULATORY_CARE_PROVIDER_SITE_OTHER): Payer: Self-pay | Admitting: Nurse Practitioner

## 2021-04-12 ENCOUNTER — Ambulatory Visit (INDEPENDENT_AMBULATORY_CARE_PROVIDER_SITE_OTHER): Payer: Medicare Other

## 2021-04-12 ENCOUNTER — Ambulatory Visit (INDEPENDENT_AMBULATORY_CARE_PROVIDER_SITE_OTHER): Payer: Medicare Other | Admitting: Nurse Practitioner

## 2021-04-12 ENCOUNTER — Other Ambulatory Visit: Payer: Self-pay

## 2021-04-12 VITALS — BP 98/58 | HR 66 | Ht 67.0 in | Wt 144.0 lb

## 2021-04-12 DIAGNOSIS — R2232 Localized swelling, mass and lump, left upper limb: Secondary | ICD-10-CM | POA: Diagnosis not present

## 2021-04-12 DIAGNOSIS — I70213 Atherosclerosis of native arteries of extremities with intermittent claudication, bilateral legs: Secondary | ICD-10-CM

## 2021-04-12 DIAGNOSIS — M7989 Other specified soft tissue disorders: Secondary | ICD-10-CM | POA: Diagnosis not present

## 2021-04-12 DIAGNOSIS — I10 Essential (primary) hypertension: Secondary | ICD-10-CM

## 2021-04-12 NOTE — Progress Notes (Signed)
Subjective:    Patient ID: Laura Reed, female    DOB: [birth year], 60 y.o.   MRN: [id number] Chief Complaint  Patient presents with   Follow-up    UE art dup+ consult  has a large mass on left upper arm upper arm over brachial artery rule out aneurysm.     Laura Reed is a 60 year old female that presents today for evaluation of swelling in her left upper arm.  The patient noticed the swelling in her upper arm but recently became concerned after discussing with a family member.  She presented to her primary care provider who believed it to be a lipoma but given her vascular history wanted to ensure that there was no blood clot or aneurysmal degeneration.  The patient notes that the area does not hurt.  She notes that sometimes it seems to grow but this is sporadic.  She denies any specific trauma to the area.  She denies any fevers or chills.  Today noninvasive studies show a mass that measures 3.79 cm x 1.62 cm.  There isflow seen within it is also calcified.  It is noted to be a hematoma.   Review of Systems  Musculoskeletal:        Swelling in upper arm  All other systems reviewed and are negative.     Objective:   Physical Exam Vitals reviewed.  HENT:     Head: Normocephalic.  Cardiovascular:     Rate and Rhythm: Normal rate.     Pulses: Normal pulses.          Radial pulses are 2+ on the left side.  Pulmonary:     Effort: Pulmonary effort is normal.  Skin:    General: Skin is warm and dry.     Comments: Soft palpable mass in left upper arm  Neurological:     Mental Status: She is alert and oriented to person, place, and time.     Gait: Gait abnormal.  Psychiatric:        Mood and Affect: Mood normal.        Behavior: Behavior normal.        Thought Content: Thought content normal.        Judgment: Judgment normal.    BP (!) 98/58   Pulse 66   Ht 5\' 7"  (1.702 m)   Wt 144 lb (65.3 kg)   BMI 22.55 kg/m   Past Medical History:  Diagnosis Date   Absence of  kidney    left   Anxiety    Arthritis    Bladder cancer (HCC)    CHF (congestive heart failure) (HCC)    Complication of anesthesia    BP HAS  RUN LOW AFTER SURGERY-LUNGS FILLED UP WITH FLUID AFTER  LEG STENT SURGERY    Coronary artery disease    Diabetes mellitus    Family history of adverse reaction to anesthesia    Sister - PONV   GERD (gastroesophageal reflux disease)    OCC TUMS   Heart murmur    Hemorrhoid    History of methicillin resistant staphylococcus aureus (MRSA) 2007   Hypertension    Neuropathy    PVD (peripheral vascular disease) (HCC)    Thyroid nodule    right   Urothelial carcinoma of kidney ([place]) 10/31/2014   INVASIVE UROTHELIAL CARCINOMA, LOW GRADE. T1, Nx.   Wears dentures    full upper and lower    Social History   Socioeconomic History   Marital status: Legally  Separated    Spouse name: Not on file   Number of children: 2   Years of education: 52   Highest education level: 12th grade  Occupational History   Occupation: Disabled  Tobacco Use   Smoking status: Former    Packs/day: 2.00    Years: 35.00    Pack years: 70.00    Types: Cigarettes    Quit date: 03/29/2013    Years since quitting: 8.0   Smokeless tobacco: Never  Vaping Use   Vaping Use: Former  Substance and Sexual Activity   Alcohol use: Not Currently    Alcohol/week: 0.0 standard drinks    Comment: LAST DRINK 2009   Drug use: Not Currently    Types: Cocaine    Comment: last used in 2007   Sexual activity: Not Currently  Other Topics Concern   Not on file  Social History Narrative   Not on file   Social Determinants of Health   Financial Resource Strain: Low Risk    Difficulty of Paying Living Expenses: Not hard at all  Food Insecurity: No Food Insecurity   Worried About Charity fundraiser in the Last Year: Never true   [place] in the Last Year: Never true  Transportation Needs: No Transportation Needs   Lack of Transportation (Medical): No   Lack of  Transportation (Non-Medical): No  Physical Activity: Inactive   Days of Exercise per Week: 0 days   Minutes of Exercise per Session: 0 min  Stress: Stress Concern Present   Feeling of Stress : Rather much  Social Connections: Moderately Isolated   Frequency of Communication with Friends and Family: More than three times a week   Frequency of Social Gatherings with Friends and Family: More than three times a week   Attends Religious Services: 1 to 4 times per year   Active Member of [employer] or Organizations: No   Attends Archivist Meetings: Never   Marital Status: Separated  Intimate Partner Violence: Not At Risk   Fear of Current or Ex-Partner: No   Emotionally Abused: No   Physically Abused: No   Sexually Abused: No    Past Surgical History:  Procedure Laterality Date   AMPUTATION TOE     right (4th and 5th); left (great toe, 3rd)   AMPUTATION TOE Right 07/16/2018   Procedure: AMPUTATION TOE/MPJ right 2nd;  Surgeon: Sharlotte Alamo, DPM;  Location: [facility];  Service: Podiatry;  Laterality: Right;   ARTERIAL BYPASS SURGRY  2009, 2013 x 2   right leg , done in [place] Right 01/2014   Dr Delana Meyer   CATARACT EXTRACTION W/PHACO Right 12/14/2014   Procedure: CATARACT EXTRACTION PHACO AND INTRAOCULAR LENS PLACEMENT ([place]);  Surgeon: Lyla Glassing, MD;  Location: [facility];  Service: Ophthalmology;  Laterality: Right;  [place]   00:38.6              AP        7.1                   CDE  2.76   CATARACT EXTRACTION W/PHACO Left 12/06/2019   Procedure: CATARACT EXTRACTION PHACO AND INTRAOCULAR LENS PLACEMENT ([place]) LEFT DIABETIC;  Surgeon: Birder Robson, MD;  Location: [place];  Service: Ophthalmology;  Laterality: Left;  9.08 1:06.4   CESAREAN SECTION     CHOLECYSTECTOMY  03-03-12   Porcelain gallbladder, gallstones,  [place]   COLONOSCOPY W/  BIOPSIES  04/28/2012   Hyperplastic rectal polyps.   CORONARY ARTERY BYPASS GRAFT   2009   3 vessel   CYSTOSCOPY W/ RETROGRADES Right 09/01/2016   Procedure: CYSTOSCOPY WITH RETROGRADE PYELOGRAM;  Surgeon: Hollice Espy, MD;  Location: [facility];  Service: Urology;  Laterality: Right;   CYSTOSCOPY W/ RETROGRADES Bilateral 03/19/2020   Procedure: CYSTOSCOPY WITH RETROGRADE PYELOGRAM;  Surgeon: Hollice Espy, MD;  Location: [facility];  Service: Urology;  Laterality: Bilateral;   CYSTOSCOPY WITH BIOPSY N/A 03/19/2020   Procedure: CYSTOSCOPY WITH BIOPSY;  Surgeon: Hollice Espy, MD;  Location: [facility];  Service: Urology;  Laterality: N/A;   EYE SURGERY     HERNIA REPAIR  10-31-14   ventral, retro-rectus atrium mesh   IRRIGATION AND DEBRIDEMENT FOOT Left 01/18/2019   Procedure: IRRIGATION AND DEBRIDEMENT FOOT;  Surgeon: Sharlotte Alamo, DPM;  Location: [facility];  Service: Podiatry;  Laterality: Left;   LOWER EXTREMITY ANGIOGRAPHY Left 12/10/2016   Procedure: Lower Extremity Angiography;  Surgeon: Katha Cabal, MD;  Location: [facility];  Service: Cardiovascular;  Laterality: Left;   LOWER EXTREMITY ANGIOGRAPHY Left 02/02/2018   Procedure: LOWER EXTREMITY ANGIOGRAPHY;  Surgeon: Katha Cabal, MD;  Location: [place] [facility];  Service: Cardiovascular;  Laterality: Left;   LOWER EXTREMITY ANGIOGRAPHY Left 05/05/2018   Procedure: LOWER EXTREMITY ANGIOGRAPHY;  Surgeon: Katha Cabal, MD;  Location: [place] [facility];  Service: Cardiovascular;  Laterality: Left;   LOWER EXTREMITY ANGIOGRAPHY Left 12/04/2020   Procedure: LOWER EXTREMITY ANGIOGRAPHY with Intervention;  Surgeon: Katha Cabal, MD;  Location: [place] [facility];  Service: Cardiovascular;  Laterality: Left;   NEPHRECTOMY Left 10-31-14   PERIPHERAL VASCULAR CATHETERIZATION Left 05/01/2015   Procedure: Lower Extremity Angiography;  Surgeon: Katha Cabal, MD;  Location: [facility];  Service: Cardiovascular;  Laterality: Left;   PERIPHERAL VASCULAR CATHETERIZATION  05/01/2015    Procedure: Lower Extremity Intervention;  Surgeon: Katha Cabal, MD;  Location: [place];  Service: Cardiovascular;;   PERIPHERAL VASCULAR CATHETERIZATION Left 02/20/2015   Procedure: Pelvic Angiography;  Surgeon: Katha Cabal, MD;  Location: [place] [facility];  Service: Cardiovascular;  Laterality: Left;   TRANSURETHRAL RESECTION OF BLADDER TUMOR WITH MITOMYCIN-C N/A 09/01/2016   Procedure: TRANSURETHRAL RESECTION OF BLADDER TUMOR WITH MITOMYCIN-C;  Surgeon: Hollice Espy, MD;  Location: [facility];  Service: Urology;  Laterality: N/A;    Family History  Problem Relation Age of Onset   Cancer Mother 71       Lung Cancer   Cancer Father 3       Lung Ca   Diabetes Son    Breast cancer Maternal Grandmother    Kidney cancer Neg Hx    Bladder Cancer Neg Hx    Prostate cancer Neg Hx     No Known Allergies  CBC Latest Ref Rng & Units 03/15/2020 01/19/2019 01/18/2019  WBC 4.0 - 10.5 K/uL 9.2 8.9 11.4(H)  Hemoglobin 12.0 - 15.0 g/dL 13.6 12.2 12.4  Hematocrit 36.0 - 46.0 % 37.4 37.1 36.8  Platelets 150 - 400 K/uL 274 289 271      CMP     Component Value Date/Time   NA 136 03/28/2021 1026   NA 135 11/02/2014 0609   K 4.1 03/28/2021 1026   K 4.2 11/02/2014 0609   CL 100 03/28/2021 1026   CL 107 11/02/2014 0609   CO2 28 03/28/2021 1026   CO2 23 11/02/2014 0609   GLUCOSE 145 (H)  03/28/2021 1026   GLUCOSE 108 (H) 11/02/2014 0609   BUN 22 03/28/2021 1026   BUN 20 11/02/2014 0609   CREATININE 0.95 03/28/2021 1026   CREATININE 1.01 11/09/2015 1549   CREATININE 1.01 11/09/2015 1549   CALCIUM 9.2 03/28/2021 1026   CALCIUM 7.3 (L) 11/02/2014 0609   PROT 6.6 03/28/2021 1026   PROT 5.1 (L) 06/03/2013 0353   ALBUMIN 4.1 03/28/2021 1026   ALBUMIN 2.2 (L) 06/03/2013 0353   AST 20 03/28/2021 1026   AST 7 (L) 06/03/2013 0353   ALT 24 03/28/2021 1026   ALT 10 (L) 06/03/2013 0353   ALKPHOS 67 03/28/2021 1026   ALKPHOS 74 06/03/2013 0353   BILITOT 0.4 03/28/2021  1026   BILITOT 0.2 06/03/2013 0353   GFRNONAA >60 12/04/2020 0921   GFRNONAA 50 (L) 11/02/2014 0609   GFRAA >60 01/19/2019 0357   GFRAA 58 (L) 11/02/2014 0609     VAS [place] ABI WITH/WO TBI  Result Date: 03/21/2021  LOWER EXTREMITY DOPPLER STUDY Patient Name:  Laura Reed  Date of Exam:   03/21/2021 Medical Rec #: [id number]        Accession #:    [id number] Date of Birth: Apr 22, 1961        Patient Gender: F Patient Age:   56 years Exam Location:  [facility] Procedure:      VAS [place] ABI WITH/WO TBI Referring Phys: Eulogio Ditch --------------------------------------------------------------------------------  Indications: Peripheral artery disease, and ASO S/P Intervention. Other Factors: H/O right 4th & 5th and left 1st & 3rd toe amputations                12/03/2020 left pop thrombectomy and new stent.  Vascular Interventions: H/O left-to-right fem-fem BPG with lower extremity                         stents & BPGs in [place];                         10/11/09: Left external iliac/common femoral artery PTA;                         07/25/11: Right profunda femoral to TP trunk BPG;                         12/31/11: Revision of fem-fem BPG & redo of anastomosis;                         09/16/12: Fem-fem BPG, left external iliac, left common                         femoral & left profunda femoral artery PTAs;                         06/01/13: Left external iliac to profunda femoral BPG                         with revision of the fem-fem & left fem-AK popliteal BPG                         proximal anastomoses;  08/02/13: Left distal SFA/popliteal artery PTA;                         02/20/15: Left external iliac artery PTA/stent with left                         common iliac artery PTA;                         12/10/16: Left external iliac & common femoral artery                         PTAs. Performing Technologist: Concha Norway RVT  Examination Guidelines: A complete evaluation includes  at minimum, Doppler waveform signals and systolic blood pressure reading at the level of bilateral brachial, anterior tibial, and posterior tibial arteries, when vessel segments are accessible. Bilateral testing is considered an integral part of a complete examination. Photoelectric Plethysmograph (PPG) waveforms and toe systolic pressure readings are included as required and additional duplex testing as needed. Limited examinations for reoccurring indications may be performed as noted.  ABI Findings: +---------+------------------+-----+--------+--------+ Right    Rt Pressure (mmHg)IndexWaveformComment  +---------+------------------+-----+--------+--------+ Brachial 116                                     +---------+------------------+-----+--------+--------+ ATA      126                    biphasic1.02     +---------+------------------+-----+--------+--------+ PTA      119               0.98 biphasic         +---------+------------------+-----+--------+--------+ Great Toe88                0.72 Normal           +---------+------------------+-----+--------+--------+ +--------+------------------+-----+--------+-------+ Left    Lt Pressure (mmHg)IndexWaveformComment +--------+------------------+-----+--------+-------+ [id number]                                    +--------+------------------+-----+--------+-------+ ATA     129                    biphasic1.06    +--------+------------------+-----+--------+-------+ PTA     120               0.98 biphasic        +--------+------------------+-----+--------+-------+ +-------+-----------+-----------+------------+------------+ ABI/TBIToday's ABIToday's TBIPrevious ABIPrevious TBI +-------+-----------+-----------+------------+------------+ Right  1.02       .72        1.11        .77          +-------+-----------+-----------+------------+------------+ Left   1.06       amp        1.15        amp           +-------+-----------+-----------+------------+------------+ Bilateral ABIs and TBIs appear essentially unchanged compared to prior study on 12/19/2020.  Summary: Right: Resting right ankle-brachial index is within normal range. No evidence of significant right lower extremity arterial disease. The right toe-brachial index is normal. Left: Resting left ankle-brachial index is within normal range. No evidence of significant left lower extremity arterial disease. Lt great toe amputated.  *See table(s)  above for measurements and observations.  Electronically signed by Hortencia Pilar MD on 03/21/2021 at 6:58:40 PM.    Final        Assessment & Plan:   1. Left arm swelling Noninvasive studies show that there is no vascular involvement.  Studies indicate that these may be a hematoma however it is not quite characteristic of hematoma.  The patient also denies any trauma to the area.  I believe it is more so likely a lipoma.  Currently it does not hurt and it has been relatively stable for the last several months.  Patient is advised that if it begins to hurt or show growth she should follow-up with PCP for further work-up and possible referral to general surgeon.  2. Atherosclerosis of native artery of both lower extremities with intermittent claudication (HCC) Currently stable patient has a 41-month follow-up scheduled.  3. Primary hypertension Continue antihypertensive medications as already ordered, these medications have been reviewed and there are no changes at this time.    Current Outpatient Medications on File Prior to Visit  Medication Sig Dispense Refill   acetaminophen (TYLENOL) 500 MG tablet Take 1,000 mg by mouth every 6 (six) hours as needed for mild pain or headache.      ALPRAZolam (XANAX) 0.5 MG tablet TAKE 1 TABLET(0.5 MG) BY MOUTH DAILY AS NEEDED FOR ANXIETY 30 tablet 5   amLODipine (NORVASC) 2.5 MG tablet Take 1 tablet (2.5 mg total) by mouth daily. 90 tablet 1   aspirin 81 MG chewable  tablet Chew 81 mg by mouth at bedtime.      atorvastatin (LIPITOR) 20 MG tablet TAKE 1 TABLET(20 MG) BY MOUTH DAILY 90 tablet 1   BIOTIN PO Take 1 tablet by mouth daily.     Cholecalciferol (VITAMIN D3) 2000 UNITS TABS Take 2,000 Units by mouth daily after supper.      clopidogrel (PLAVIX) 75 MG tablet TAKE 1 TABLET(75 MG) BY MOUTH DAILY 90 tablet 1   Continuous Blood Gluc Sensor (FREESTYLE LIBRE 2 SENSOR) MISC Use to check sugar at least 4 times daily 2 each 2   gabapentin (NEURONTIN) 800 MG tablet Take 1 tablet (800 mg total) by mouth 3 (three) times daily. 270 tablet 3   glucose blood (ACCU-CHEK AVIVA PLUS) test strip TEST BLOOD SUGAR FOUR TIMES DAILY 400 strip 1   insulin degludec (TRESIBA FLEXTOUCH) 100 UNIT/ML FlexTouch Pen Inject 22 Units into the skin daily. 30 mL 2   Insulin Pen Needle (PEN NEEDLES) 32G X 4 MM MISC Use to take insulin daily 100 each 3   JARDIANCE 25 MG TABS tablet TAKE 1 TABLET(25 MG) BY MOUTH DAILY BEFORE BREAKFAST 30 tablet 2   Lancets Misc. MISC Use to check blood sugars 4 times daily 400 each 3   losartan (COZAAR) 50 MG tablet TAKE 1 TABLET(50 MG) BY MOUTH DAILY 90 tablet 3   Multiple Vitamin (MULTIVITAMIN) tablet Take 1 tablet by mouth daily.     multivitamin-lutein (OCUVITE-LUTEIN) CAPS capsule Take 1 capsule by mouth daily.     OZEMPIC, 1 MG/DOSE, 4 MG/3ML SOPN INJECT 1 MG UNDER THE SKIN ONCE A WEEK 3 mL 2   No current facility-administered medications on file prior to visit.    There are no Patient Instructions on file for this visit. No follow-ups on file.   Kris Hartmann, NP

## 2021-04-16 ENCOUNTER — Other Ambulatory Visit: Payer: Self-pay | Admitting: Internal Medicine

## 2021-04-16 DIAGNOSIS — Z794 Long term (current) use of insulin: Secondary | ICD-10-CM

## 2021-04-16 DIAGNOSIS — E1165 Type 2 diabetes mellitus with hyperglycemia: Secondary | ICD-10-CM

## 2021-04-27 DIAGNOSIS — E118 Type 2 diabetes mellitus with unspecified complications: Secondary | ICD-10-CM | POA: Diagnosis not present

## 2021-04-27 DIAGNOSIS — Z794 Long term (current) use of insulin: Secondary | ICD-10-CM | POA: Diagnosis not present

## 2021-05-21 ENCOUNTER — Telehealth: Payer: Self-pay | Admitting: Internal Medicine

## 2021-05-21 NOTE — Telephone Encounter (Signed)
Cara from [employer] called in regards to pt. She states she received the order for orthopedic shoes for the pt however she needs follow up notes regarding the order. She is needing the foot exam, diabetic care, anything in regards to the order for the diabetic shoes for pt.   Moshe Salisbury at [place]: [phone]

## 2021-05-22 NOTE — Telephone Encounter (Signed)
Office note has been faxed.

## 2021-05-28 DIAGNOSIS — Z794 Long term (current) use of insulin: Secondary | ICD-10-CM | POA: Diagnosis not present

## 2021-05-28 DIAGNOSIS — E118 Type 2 diabetes mellitus with unspecified complications: Secondary | ICD-10-CM | POA: Diagnosis not present

## 2021-05-30 ENCOUNTER — Other Ambulatory Visit: Payer: Self-pay | Admitting: Internal Medicine

## 2021-05-30 DIAGNOSIS — Z794 Long term (current) use of insulin: Secondary | ICD-10-CM

## 2021-05-30 DIAGNOSIS — E1165 Type 2 diabetes mellitus with hyperglycemia: Secondary | ICD-10-CM

## 2021-06-04 ENCOUNTER — Telehealth: Payer: Self-pay | Admitting: Internal Medicine

## 2021-06-04 ENCOUNTER — Other Ambulatory Visit: Payer: Self-pay | Admitting: Internal Medicine

## 2021-06-04 DIAGNOSIS — Z89429 Acquired absence of other toe(s), unspecified side: Secondary | ICD-10-CM | POA: Insufficient documentation

## 2021-06-04 DIAGNOSIS — I739 Peripheral vascular disease, unspecified: Secondary | ICD-10-CM

## 2021-06-04 DIAGNOSIS — E1142 Type 2 diabetes mellitus with diabetic polyneuropathy: Secondary | ICD-10-CM

## 2021-06-04 NOTE — Telephone Encounter (Signed)
Laura Reed from [employer], [phone]. Insurance is requiring a written statement with 1 or more diginose issues, but with patient be a partial foot amputee that will be more then enough reason for insurance to approve shoes.Laura Reed is asking a written amendment to the first order for diabetic shoes to state that reason. Any questions please call her.

## 2021-06-05 NOTE — Telephone Encounter (Signed)
Printed, signed and faxed.  

## 2021-06-07 DIAGNOSIS — E113512 Type 2 diabetes mellitus with proliferative diabetic retinopathy with macular edema, left eye: Secondary | ICD-10-CM | POA: Diagnosis not present

## 2021-06-10 ENCOUNTER — Ambulatory Visit (INDEPENDENT_AMBULATORY_CARE_PROVIDER_SITE_OTHER): Payer: Medicare Other

## 2021-06-10 VITALS — Ht 67.0 in | Wt 144.0 lb

## 2021-06-10 DIAGNOSIS — Z780 Asymptomatic menopausal state: Secondary | ICD-10-CM

## 2021-06-10 DIAGNOSIS — Z0000 Encounter for general adult medical examination without abnormal findings: Secondary | ICD-10-CM

## 2021-06-10 DIAGNOSIS — Z1231 Encounter for screening mammogram for malignant neoplasm of breast: Secondary | ICD-10-CM | POA: Diagnosis not present

## 2021-06-10 NOTE — Progress Notes (Addendum)
Subjective:   Laura Reed is a 60 y.o. female who presents for Medicare Annual (Subsequent) preventive examination.  Review of Systems    No ROS.  Medicare Wellness Virtual Visit.  Visual/audio telehealth visit, UTA vital signs.   See social history for additional risk factors.   Cardiac Risk Factors include: advanced age (>57men, >81 women);diabetes mellitus;hypertension     Objective:    Today's Vitals   06/10/21 1234  Weight: 144 lb (65.3 kg)  Height: 5\' 7"  (1.702 m)   Body mass index is 22.55 kg/m.  Advanced Directives 12/04/2020 09/27/2020 03/19/2020 03/08/2020 12/06/2019 05/26/2019 01/18/2019  Does Patient Have a Medical Advance Directive? Yes No Yes Yes Yes Yes No;Yes  Type of Paramedic of [place];Living will - [place];Living will [place];Living will [place]  Does patient want to make changes to medical advance directive? - - No - Patient declined - No - Patient declined No - Patient declined No - Patient declined  Copy of [place] in Chart? - - No - copy requested - Yes - validated most recent copy scanned in chart (See row information) No - copy requested No - copy requested  Would patient like information on creating a medical advance directive? - No - Patient declined - - - - No - Patient declined    Current Medications (verified) Outpatient Encounter Medications as of 06/10/2021  Medication Sig   acetaminophen (TYLENOL) 500 MG tablet Take 1,000 mg by mouth every 6 (six) hours as needed for mild pain or headache.    ALPRAZolam (XANAX) 0.5 MG tablet TAKE 1 TABLET(0.5 MG) BY MOUTH DAILY AS NEEDED FOR ANXIETY   amLODipine (NORVASC) 2.5 MG tablet Take 1 tablet (2.5 mg total) by mouth daily.   aspirin 81 MG chewable tablet Chew 81 mg by mouth at bedtime.    atorvastatin (LIPITOR) 20 MG tablet TAKE 1 TABLET(20 MG) BY MOUTH DAILY   BIOTIN  PO Take 1 tablet by mouth daily.   Cholecalciferol (VITAMIN D3) 2000 UNITS TABS Take 2,000 Units by mouth daily after supper.    clopidogrel (PLAVIX) 75 MG tablet TAKE 1 TABLET(75 MG) BY MOUTH DAILY   Continuous Blood Gluc Sensor (FREESTYLE LIBRE 2 SENSOR) MISC Use to check sugar at least 4 times daily   gabapentin (NEURONTIN) 800 MG tablet Take 1 tablet (800 mg total) by mouth 3 (three) times daily.   glucose blood (ACCU-CHEK AVIVA PLUS) test strip TEST BLOOD SUGAR FOUR TIMES DAILY   insulin degludec (TRESIBA FLEXTOUCH) 100 UNIT/ML FlexTouch Pen Inject 22 Units into the skin daily.   Insulin Pen Needle (PEN NEEDLES) 32G X 4 MM MISC Use to take insulin daily   JARDIANCE 25 MG TABS tablet TAKE 1 TABLET(25 MG) BY MOUTH DAILY BEFORE BREAKFAST   Lancets Misc. MISC Use to check blood sugars 4 times daily   losartan (COZAAR) 50 MG tablet TAKE 1 TABLET(50 MG) BY MOUTH DAILY   Multiple Vitamin (MULTIVITAMIN) tablet Take 1 tablet by mouth daily.   multivitamin-lutein (OCUVITE-LUTEIN) CAPS capsule Take 1 capsule by mouth daily.   OZEMPIC, 1 MG/DOSE, 4 MG/3ML SOPN INJECT 1 MG UNDER THE SKIN ONCE A WEEK   No facility-administered encounter medications on file as of 06/10/2021.    Allergies (verified) Patient has no known allergies.   History: Past Medical History:  Diagnosis Date   Absence of kidney    left   Anxiety  Arthritis    Bladder cancer (HCC)    CHF (congestive heart failure) (HCC)    Complication of anesthesia    BP HAS  RUN LOW AFTER SURGERY-LUNGS FILLED UP WITH FLUID AFTER  LEG STENT SURGERY    Coronary artery disease    Diabetes mellitus    Family history of adverse reaction to anesthesia    Sister - PONV   GERD (gastroesophageal reflux disease)    OCC TUMS   Heart murmur    Hemorrhoid    History of methicillin resistant staphylococcus aureus (MRSA) 2007   Hypertension    Neuropathy    PVD (peripheral vascular disease) (HCC)    Thyroid nodule    right   Urothelial  carcinoma of kidney ([place]) 10/31/2014   INVASIVE UROTHELIAL CARCINOMA, LOW GRADE. T1, Nx.   Wears dentures    full upper and lower   Past Surgical History:  Procedure Laterality Date   AMPUTATION TOE     right (4th and 5th); left (great toe, 3rd)   AMPUTATION TOE Right 07/16/2018   Procedure: AMPUTATION TOE/MPJ right 2nd;  Surgeon: Sharlotte Alamo, DPM;  Location: [facility];  Service: Podiatry;  Laterality: Right;   ARTERIAL BYPASS SURGRY  2009, 2013 x 2   right leg , done in [place] Right 01/2014   Dr Delana Meyer   CATARACT EXTRACTION W/PHACO Right 12/14/2014   Procedure: CATARACT EXTRACTION PHACO AND INTRAOCULAR LENS PLACEMENT ([place]);  Surgeon: Lyla Glassing, MD;  Location: [facility];  Service: Ophthalmology;  Laterality: Right;  [place]   00:38.6              AP        7.1                   CDE  2.76   CATARACT EXTRACTION W/PHACO Left 12/06/2019   Procedure: CATARACT EXTRACTION PHACO AND INTRAOCULAR LENS PLACEMENT ([place]) LEFT DIABETIC;  Surgeon: Birder Robson, MD;  Location: [facility];  Service: Ophthalmology;  Laterality: Left;  9.08 1:06.4   CESAREAN SECTION     CHOLECYSTECTOMY  03-03-12   Porcelain gallbladder, gallstones,  [place]   COLONOSCOPY W/ BIOPSIES  04/28/2012   Hyperplastic rectal polyps.   CORONARY ARTERY BYPASS GRAFT  2009   3 vessel   CYSTOSCOPY W/ RETROGRADES Right 09/01/2016   Procedure: CYSTOSCOPY WITH RETROGRADE PYELOGRAM;  Surgeon: Hollice Espy, MD;  Location: [facility];  Service: Urology;  Laterality: Right;   CYSTOSCOPY W/ RETROGRADES Bilateral 03/19/2020   Procedure: CYSTOSCOPY WITH RETROGRADE PYELOGRAM;  Surgeon: Hollice Espy, MD;  Location: [facility];  Service: Urology;  Laterality: Bilateral;   CYSTOSCOPY WITH BIOPSY N/A 03/19/2020   Procedure: CYSTOSCOPY WITH BIOPSY;  Surgeon: Hollice Espy, MD;  Location: [facility];  Service: Urology;  Laterality: N/A;   EYE SURGERY     HERNIA REPAIR  10-31-14   ventral,  retro-rectus atrium mesh   IRRIGATION AND DEBRIDEMENT FOOT Left 01/18/2019   Procedure: IRRIGATION AND DEBRIDEMENT FOOT;  Surgeon: Sharlotte Alamo, DPM;  Location: [facility];  Service: Podiatry;  Laterality: Left;   LOWER EXTREMITY ANGIOGRAPHY Left 12/10/2016   Procedure: Lower Extremity Angiography;  Surgeon: Katha Cabal, MD;  Location: [facility];  Service: Cardiovascular;  Laterality: Left;   LOWER EXTREMITY ANGIOGRAPHY Left 02/02/2018   Procedure: LOWER EXTREMITY ANGIOGRAPHY;  Surgeon: Katha Cabal, MD;  Location: [facility];  Service: Cardiovascular;  Laterality: Left;   LOWER EXTREMITY  ANGIOGRAPHY Left 05/05/2018   Procedure: LOWER EXTREMITY ANGIOGRAPHY;  Surgeon: Katha Cabal, MD;  Location: [facility];  Service: Cardiovascular;  Laterality: Left;   LOWER EXTREMITY ANGIOGRAPHY Left 12/04/2020   Procedure: LOWER EXTREMITY ANGIOGRAPHY with Intervention;  Surgeon: Katha Cabal, MD;  Location: [facility];  Service: Cardiovascular;  Laterality: Left;   NEPHRECTOMY Left 10-31-14   PERIPHERAL VASCULAR CATHETERIZATION Left 05/01/2015   Procedure: Lower Extremity Angiography;  Surgeon: Katha Cabal, MD;  Location: [facility];  Service: Cardiovascular;  Laterality: Left;   PERIPHERAL VASCULAR CATHETERIZATION  05/01/2015   Procedure: Lower Extremity Intervention;  Surgeon: Katha Cabal, MD;  Location: [facility];  Service: Cardiovascular;;   PERIPHERAL VASCULAR CATHETERIZATION Left 02/20/2015   Procedure: Pelvic Angiography;  Surgeon: Katha Cabal, MD;  Location: [facility];  Service: Cardiovascular;  Laterality: Left;   TRANSURETHRAL RESECTION OF BLADDER TUMOR WITH MITOMYCIN-C N/A 09/01/2016   Procedure: TRANSURETHRAL RESECTION OF BLADDER TUMOR WITH MITOMYCIN-C;  Surgeon: Hollice Espy, MD;  Location: [facility];  Service: Urology;  Laterality: N/A;   Family History  Problem Relation Age of Onset    Cancer Mother 34       Lung Cancer   Cancer Father 7       Lung Ca   Diabetes Son    Breast cancer Maternal Grandmother    Kidney cancer Neg Hx    Bladder Cancer Neg Hx    Prostate cancer Neg Hx    Social History   Socioeconomic History   Marital status: Legally Separated    Spouse name: Not on file   Number of children: 2   Years of education: 12   Highest education level: 12th grade  Occupational History   Occupation: Disabled  Tobacco Use   Smoking status: Former    Packs/day: 2.00    Years: 35.00    Pack years: 70.00    Types: Cigarettes    Quit date: 03/29/2013    Years since quitting: 8.2   Smokeless tobacco: Never  Vaping Use   Vaping Use: Former  Substance and Sexual Activity   Alcohol use: Not Currently    Alcohol/week: 0.0 standard drinks    Comment: LAST DRINK 2009   Drug use: Not Currently    Types: Cocaine    Comment: last used in 2007   Sexual activity: Not Currently  Other Topics Concern   Not on file  Social History Narrative   Not on file   Social Determinants of Health   Financial Resource Strain: Low Risk    Difficulty of Paying Living Expenses: Not hard at all  Food Insecurity: No Food Insecurity   Worried About Charity fundraiser in the Last Year: Never true   [place] in the Last Year: Never true  Transportation Needs: No Transportation Needs   Lack of Transportation (Medical): No   Lack of Transportation (Non-Medical): No  Physical Activity: Inactive   Days of Exercise per Week: 0 days   Minutes of Exercise per Session: 0 min  Stress: No Stress Concern Present   Feeling of Stress : Only a little  Social Connections: Moderately Isolated   Frequency of Communication with Friends and Family: More than three times a week   Frequency of Social Gatherings with Friends and Family: More than three times a week   Attends Religious Services: 1 to 4 times per year   Active Member of [employer] or Organizations:  No   Attends Theatre manager Meetings: Never   Marital Status: Separated    Tobacco Counseling Counseling given: Not Answered   Clinical Intake:  Pre-visit preparation completed: Yes        Diabetes: Yes (Followed by pcp and CCM)  How often do you need to have someone help you when you read instructions, pamphlets, or other written materials from your doctor or pharmacy?: 1 - NeverIs the patient seen by Chronic Care Management for management of their diabetes?  Yes    Interpreter Needed?: No      Activities of Daily Living In your present state of health, do you have any difficulty performing the following activities: 06/10/2021 12/04/2020  Hearing? N N  Vision? N N  Difficulty concentrating or making decisions? N N  Walking or climbing stairs? N N  Dressing or bathing? N N  Doing errands, shopping? N -  Preparing Food and eating ? N -  Using the Toilet? N -  In the past six months, have you accidently leaked urine? N -  Do you have problems with loss of bowel control? N -  Managing your Medications? N -  Managing your Finances? N -  Housekeeping or managing your Housekeeping? N -  Some recent data might be hidden    Patient Care Team: [name], MD as PCP - General (Internal Medicine) Bary Castilla, Forest Gleason, MD (General Surgery) [name], MD (Internal Medicine) De Hollingshead, RPH-CPP as Pharmacist (Pharmacist) Saporito, Maree Erie, LCSW as Social Worker (Licensed Clinical Social Worker)  Indicate any recent [employer] you may have received from other than [facility] providers in the past year (date may be approximate).     Assessment:   This is a routine wellness examination for [place].  Virtual Visit via Telephone Note  I connected with  Laura Reed on 06/10/21 at 12:30 PM EST by telephone and verified that I am speaking with the correct person using two identifiers.  Location: Patient: home Provider: office Persons participating in the virtual visit:  patient/Nurse Health Advisor   I discussed the limitations, risks, security and privacy concerns of performing an evaluation and management service by telephone and the availability of in person appointments. The patient expressed understanding and agreed to proceed.  Interactive audio and video telecommunications were attempted between this nurse and patient, however failed, due to patient having technical difficulties OR patient did not have access to video capability.  We continued and completed visit with audio only.  Some vital signs may be absent or patient reported.   Hearing/Vision screen Hearing Screening - Comments:: Patient is able to hear conversational tones without difficulty.  No issues reported.  Vision Screening - Comments:: Followed by [facility]  Wears corrective lenses  Cataract extraction, bilateral Injections every 6 weeks  They have regular follow up with the ophthalmologist  Dietary issues and exercise activities discussed: Current Exercise Habits: Home exercise routine Tries to eat a moderate carb diet Good water intake   Goals Addressed             This Visit's Progress    DIET - REDUCE SUGAR INTAKE       A1C within range Exercise walking on a treadmill at the gym       Depression Screen PHQ 2/9 Scores 06/10/2021 03/28/2021 11/15/2020 09/27/2020 08/08/2020 06/07/2020 03/30/2020  PHQ - 2 Score 0 0 0 0 0 0 0  PHQ- 9 Score - - 1 - - - -  Fall Risk Fall Risk  06/10/2021 03/28/2021 11/15/2020 09/27/2020 08/08/2020  Falls in the past year? 0 0 0 1 1  Number falls in past yr: - - - 0 0  Comment - - - - -  Injury with Fall? - - - 0 0  Comment - - - - -  Risk for fall due to : - - - No Fall Risks -  Risk for fall due to: Comment - - - - -  Follow up Falls evaluation completed Falls evaluation completed Falls evaluation completed Education provided;Falls prevention discussed Falls evaluation completed    FALL RISK PREVENTION PERTAINING TO THE  HOME: Home free of loose throw rugs in walkways, pet beds, electrical cords, etc? Yes  Adequate lighting in your home to reduce risk of falls? Yes   ASSISTIVE DEVICES UTILIZED TO PREVENT FALLS: Life alert? No  Use of a cane, walker or w/c? No  Grab bars in the bathroom? No   TIMED UP AND GO: Was the test performed? No .   Cognitive Function: MMSE - Mini Mental State Exam 05/21/2018 03/27/2017  Orientation to time 5 5  Orientation to Place 5 5  Registration 3 3  Attention/ Calculation 5 5  Recall 3 3  Language- name 2 objects 2 2  Language- repeat 1 1  Language- follow 3 step command 3 3  Language- read & follow direction 1 1  Write a sentence 1 1  Copy design 1 1  Total score 30 30     6CIT Screen 06/07/2020 05/26/2019  What Year? 0 points 0 points  What month? 0 points 0 points  What time? - 0 points  Count back from 20 - 0 points  Months in reverse 0 points 0 points    Immunizations Immunization History  Administered Date(s) Administered   Influenza Split 03/16/2012   Influenza,inj,Quad PF,6+ Mos 04/06/2013, 03/14/2014, 03/27/2017, 03/25/2018, 05/11/2019, 03/30/2020, 03/28/2021   Influenza-Unspecified 04/02/2015, 03/14/2016   Moderna Sars-Covid-2 Vaccination 09/28/2019, 10/26/2019, 05/29/2020, 10/29/2020   Pneumococcal Conjugate-13 06/15/2014   Pneumococcal Polysaccharide-23 07/07/2010   Tdap 03/16/2012   Pneumococcal vaccine status: Due, Education has been provided regarding the importance of this vaccine. Advised may receive this vaccine at local pharmacy or Health Dept. Aware to provide a copy of the vaccination record if obtained from local pharmacy or Health Dept. Verbalized acceptance and understanding.  Shingrix Completed?: No.    Education has been provided regarding the importance of this vaccine. Patient has been advised to call insurance company to determine out of pocket expense if they have not yet received this vaccine. Advised may also receive vaccine  at local pharmacy or Health Dept. Verbalized acceptance and understanding.  Screening Tests Health Maintenance  Topic Date Due   Pneumococcal Vaccine 45-26 Years old (3 - PPSV23 if available, else PCV20) 07/08/2015   COVID-19 Vaccine (5 - Booster for Moderna series) 06/26/2021 (Originally 12/24/2020)   Zoster Vaccines- Shingrix (1 of 2) 09/08/2021 (Originally 09/13/1979)   FOOT EXAM  07/04/2021   HEMOGLOBIN A1C  09/25/2021   PAP SMEAR-Modifier  12/05/2021   OPHTHALMOLOGY EXAM  01/24/2022   TETANUS/TDAP  03/16/2022   COLONOSCOPY (Pts 45-69yrs Insurance coverage will need to be confirmed)  04/27/2022   MAMMOGRAM  05/21/2022   INFLUENZA VACCINE  Completed   Hepatitis C Screening  Completed   HIV Screening  Completed   HPV VACCINES  Aged Out    Health Maintenance  Health Maintenance Due  Topic Date Due   Pneumococcal Vaccine 19-64 Years  old (3 - PPSV23 if available, else PCV20) 07/08/2015   Mammogram- ordered  Bone density- ordered  Hepatitis C Screening: does not qualify  Vision Screening: Recommended annual ophthalmology exams for early detection of glaucoma and other disorders of the eye.  Dental Screening: Recommended annual dental exams for proper oral hygiene  Community Resource Referral / Chronic Care Management: CRR required this visit?  No   CCM required this visit?  No      Plan:   Keep all routine maintenance appointments.   I have personally reviewed and noted the following in the patient's chart:   Medical and social history Use of alcohol, tobacco or illicit drugs  Current medications and supplements including opioid prescriptions.  Functional ability and status Nutritional status Physical activity Advanced directives List of other physicians Hospitalizations, surgeries, and ER visits in previous 12 months Vitals Screenings to include cognitive, depression, and falls Referrals and appointments  In addition, I have reviewed and discussed with  patient certain preventive protocols, quality metrics, and best practice recommendations. A written personalized care plan for preventive services as well as general preventive health recommendations were provided to patient.     OBrien-Blaney, Jordynne Mccown L, LPN   [id number]       I have reviewed the above information and agree with above.   Deborra Medina, MD

## 2021-06-10 NOTE — Patient Instructions (Addendum)
Laura Reed , Thank you for taking time to come for your Medicare Wellness Visit. I appreciate your ongoing commitment to your health goals. Please review the following plan we discussed and let me know if I can assist you in the future.   These are the goals we discussed:  Goals       Patient Stated     Medication Monitoring (pt-stated)      Patient Goals/Self-Care Activities Over the next 90 days, patient will:  - take medications as prescribed check glucose three times daily using CGM, document, and provide at future appointments check blood pressure periodically, document, and provide at future appointments        Other     DIET - REDUCE SUGAR INTAKE      A1C within range Exercise walking on a treadmill at the gym        This is a list of the screening recommended for you and due dates:  Health Maintenance  Topic Date Due   Pneumococcal Vaccination (3 - PPSV23 if available, else PCV20) 07/08/2015   COVID-19 Vaccine (5 - Booster for Moderna series) 06/26/2021*   Zoster (Shingles) Vaccine (1 of 2) 09/08/2021*   Complete foot exam   07/04/2021   Hemoglobin A1C  09/25/2021   Pap Smear  12/05/2021   Eye exam for diabetics  01/24/2022   Tetanus Vaccine  03/16/2022   Colon Cancer Screening  04/27/2022   Mammogram  05/21/2022   Flu Shot  Completed   Hepatitis C Screening: USPSTF Recommendation to screen - Ages 18-79 yo.  Completed   HIV Screening  Completed   HPV Vaccine  Aged Out  *Topic was postponed. The date shown is not the original due date.      Bone Density Test A bone density test uses a type of X-ray to measure the amount of calcium and other minerals in a person's bones. It can measure bone density in the hip and the spine. The test is similar to having a regular X-ray. This test may also be called: Bone densitometry. Bone mineral density test. Dual-energy X-ray absorptiometry (DEXA). You may have this test to: Diagnose a condition that causes weak or thin  bones (osteoporosis). Screen you for osteoporosis. Predict your risk for a broken bone (fracture). Determine how well your osteoporosis treatment is working. Tell a health care provider about: Any allergies you have. All medicines you are taking, including vitamins, herbs, eye drops, creams, and over-the-counter medicines. Any problems you or family members have had with anesthetic medicines. Any blood disorders you have. Any surgeries you have had. Any medical conditions you have. Whether you are pregnant or may be pregnant. Any medical tests you have had within the past 14 days that used contrast material. What are the risks? Generally, this is a safe test. However, it does expose you to a small amount of radiation, which can slightly increase your cancer risk. What happens before the test? Do not take any calcium supplements within the 24 hours before your test. You will need to remove all metal jewelry, eyeglasses, removable dental appliances, and any other metal objects on your body. What happens during the test?  You will lie down on an exam table. There will be an X-ray generator below you and an imaging device above you. Other devices, such as boxes or braces, may be used to position your body properly for the scan. The machine will slowly scan your body. You will need to keep very still while  the machine does the scan. The images will show up on a screen in the room. Images will be examined by a specialist after your test is finished. The procedure may vary among health care providers and hospitals. What can I expect after the test? It is up to you to get the results of your test. Ask your health care provider, or the department that is doing the test, when your results will be ready. Summary A bone density test is an imaging test that uses a type of X-ray to measure the amount of calcium and other minerals in your bones. The test may be used to diagnose or screen you for a  condition that causes weak or thin bones (osteoporosis), predict your risk for a broken bone (fracture), or determine how well your osteoporosis treatment is working. Do not take any calcium supplements within 24 hours before your test. Ask your health care provider, or the department that is doing the test, when your results will be ready. This information is not intended to replace advice given to you by your health care provider. Make sure you discuss any questions you have with your health care provider. Document Revised: 03/06/2021 Document Reviewed: 12/08/2019 Elsevier Patient Education  2022 [place] A mammogram is an X-ray of the breasts. This procedure can screen for and detect any changes that may indicate breast cancer. Mammograms are regularly done beginning at age 42 for women with average risk. A man may have a mammogram if he has a lump or swelling in his breast tissue. A mammogram can also identify other changes and variations in the breast, such as: Inflammation of the breast tissue (mastitis). An infected area that contains a collection of pus (abscess). A fluid-filled sac (cyst). Tumors that are not cancerous (benign). Fibrocystic changes. This is when breast tissue becomes denser and can make the tissue feel rope-like or uneven under the skin. Women at higher risk for breast cancer need earlier and more comprehensive screening for abnormal changes. Breast tomosynthesis, or three-dimensional (3D) mammography, and digital breast tomosynthesis are advanced forms of imaging that create 3D pictures of the breasts. Tell a health care provider: About any allergies you have. If you have breast implants. If you have had previous breast disease, biopsy, or surgery. If you have a family history of breast cancer. If you are breastfeeding. Whether you are pregnant or may be pregnant. What are the risks? Generally, this is a safe procedure. However, problems may occur,  including: Exposure to radiation. Radiation levels are very low with this test. The need for more tests. The mammogram fails to detect certain cancers or the results are misinterpreted. Difficulty with detecting breast cancer in women with dense breasts. What happens before the procedure? Schedule your test about 1-2 weeks after your menstrual period if you are menstruating. This is usually when your breasts are the least tender. If you have had a mammogram done at a different facility in the past, get the mammogram X-rays or have them sent to your current exam facility. The new and old images will be compared. Wash your breasts and underarms on the day of the test. Do not wear deodorants, perfumes, lotions, or powders anywhere on your body on the day of the test. Remove any jewelry from your neck. Wear clothes that you can change into and out of easily. What happens during the procedure?  You will undress from the waist up and put on a gown that opens in the front.  You will stand in front of the X-ray machine. Each breast will be placed between two plastic or glass plates. The plates will compress your breast for a few seconds. Try to stay as relaxed as possible. This procedure does not cause any harm to your breasts. Any discomfort you feel will be very brief. X-rays will be taken from different angles of each breast. The procedure may vary among health care providers and hospitals. What can I expect after the procedure? The mammogram will be examined by a specialist (radiologist). You may need to repeat certain parts of the test, depending on the quality of the images. This is done if the radiologist needs a better view of the breast tissue. You may resume your normal activities. It is up to you to get the results of your procedure. Ask your health care provider, or the department that is doing the procedure, when your results will be ready. Summary A mammogram is an X-ray of the breasts.  This procedure can screen for and detect any changes that may indicate breast cancer. A man may have a mammogram if he has a lump or swelling in his breast tissue. If you have had a mammogram done at a different facility in the past, get the mammogram X-rays or have them sent to your current exam facility in order to compare them. Schedule your test about 1-2 weeks after your menstrual period if you are menstruating. Ask when your test results will be ready. Make sure you get your test results. This information is not intended to replace advice given to you by your health care provider. Make sure you discuss any questions you have with your health care provider. Document Revised: 03/06/2021 Document Reviewed: 04/23/2020 Elsevier Patient Education  2022 [employer].

## 2021-06-11 ENCOUNTER — Ambulatory Visit (INDEPENDENT_AMBULATORY_CARE_PROVIDER_SITE_OTHER): Payer: Medicare Other | Admitting: Pharmacist

## 2021-06-11 DIAGNOSIS — I70213 Atherosclerosis of native arteries of extremities with intermittent claudication, bilateral legs: Secondary | ICD-10-CM

## 2021-06-11 DIAGNOSIS — I739 Peripheral vascular disease, unspecified: Secondary | ICD-10-CM

## 2021-06-11 DIAGNOSIS — E1142 Type 2 diabetes mellitus with diabetic polyneuropathy: Secondary | ICD-10-CM

## 2021-06-11 NOTE — Chronic Care Management (AMB) (Signed)
Chronic Care Management CCM Pharmacy Note  06/11/2021 Name:  Laura Reed MRN:  [id number] DOB:  1960/07/14  Summary: - Dietary indiscretions resulting in periodic post prandial elevations.   Recommendations/Changes made from today's visit: - Recommended to continue current regimen at this time with focus on minimizing dessert/carbohydrate portion sizes.  - Discussed vaccination recommendations  Subjective: Laura Reed is an 60 y.o. year old female who is a primary patient of [name], [name], MD.  The CCM team was consulted for assistance with disease management and care coordination needs.    Engaged with patient by telephone for follow up visit for pharmacy case management and/or care coordination services.   Objective:  Medications Reviewed Today     Reviewed by De Hollingshead, RPH-CPP (Pharmacist) on 06/11/21 at 1104  Med List Status: <None>   Medication Order Taking? Sig Documenting Provider Last Dose Status Informant  acetaminophen (TYLENOL) 500 MG tablet [id number] Yes Take 1,000 mg by mouth every 6 (six) hours as needed for mild pain or headache.  [provider] Taking Active Self           Med Note [name] Sep 25, 2020 10:02 AM)    ALPRAZolam Duanne Moron) 0.5 MG tablet [id number] Yes TAKE 1 TABLET(0.5 MG) BY MOUTH DAILY AS NEEDED FOR ANXIETY [name], MD Taking Active            Med Note De Hollingshead   Tue Jun 11, 2021 11:02 AM)    amLODipine (NORVASC) 2.5 MG tablet [id number] Yes Take 1 tablet (2.5 mg total) by mouth daily. [name], MD Taking Active   aspirin 81 MG chewable tablet [id number] Yes Chew 81 mg by mouth at bedtime.  [provider] Taking Active Self  atorvastatin (LIPITOR) 20 MG tablet [id number] Yes TAKE 1 TABLET(20 MG) BY MOUTH DAILY [name], MD Taking Active   BIOTIN PO [id number] Yes Take 1 tablet by mouth daily. [provider] Taking Active   Cholecalciferol (VITAMIN D3) 2000  UNITS TABS [id number] Yes Take 2,000 Units by mouth daily after supper.  [provider] Taking Active Self  clopidogrel (PLAVIX) 75 MG tablet [id number] Yes TAKE 1 TABLET(75 MG) BY MOUTH DAILY [name], MD Taking Active   Continuous Blood Gluc Sensor (FREESTYLE LIBRE 2 SENSOR) [place] [id number] Yes Use to check sugar at least 4 times daily [name], MD Taking Active   gabapentin (NEURONTIN) 800 MG tablet [id number] Yes Take 1 tablet (800 mg total) by mouth 3 (three) times daily. [name], MD Taking Active   glucose blood (ACCU-CHEK AVIVA PLUS) test strip [id number]  TEST BLOOD SUGAR FOUR TIMES DAILY [name], MD  Active   insulin degludec (TRESIBA FLEXTOUCH) 100 UNIT/ML FlexTouch Pen [id number] Yes Inject 22 Units into the skin daily. [name], MD Taking Active   Insulin Pen Needle (PEN NEEDLES) 32G X 4 MM MISC [id number] Yes Use to take insulin daily [name], MD Taking Active   JARDIANCE 25 MG TABS tablet [id number] Yes TAKE 1 TABLET(25 MG) BY MOUTH DAILY BEFORE BREAKFAST [name], MD Taking Active   Lancets Misc. [place] [id number]  Use to check blood sugars 4 times daily [name], MD  Active   losartan (COZAAR) 50 MG tablet [id number] Yes TAKE 1 TABLET(50 MG) BY MOUTH DAILY [name], MD Taking Active   Multiple Vitamin (MULTIVITAMIN) tablet [id number] Yes Take 1 tablet by  mouth daily. [provider] Taking Active   multivitamin-lutein [facility]) CAPS capsule [id number] Yes Take 1 capsule by mouth daily. [provider] Taking Active   OZEMPIC, 1 MG/DOSE, 4 MG/3ML SOPN [id number] Yes INJECT 1 MG UNDER THE SKIN ONCE A WEEK [name], MD Taking Active             Pertinent Labs:   Lab Results  Component Value Date   HGBA1C 7.3 (H) 03/28/2021   Lab Results  Component Value Date   CHOL 114 11/15/2020   HDL 55.30 11/15/2020   LDLCALC 42 11/15/2020   LDLDIRECT 47.0 01/28/2017   TRIG 84.0 11/15/2020    CHOLHDL 2 11/15/2020   Lab Results  Component Value Date   CREATININE 0.95 03/28/2021   BUN 22 03/28/2021   NA 136 03/28/2021   K 4.1 03/28/2021   CL 100 03/28/2021   CO2 28 03/28/2021    SDOH:  (Social Determinants of Health) assessments and interventions performed:  SDOH Interventions    Flowsheet Row Most Recent Value  SDOH Interventions   Financial Strain Interventions Intervention Not Indicated       CCM Care Plan  Review of patient past medical history, allergies, medications, health status, including review of consultants reports, laboratory and other test data, was performed as part of comprehensive evaluation and provision of chronic care management services.   Care Plan : Medication Management  Updates made by De Hollingshead, RPH-CPP since 06/11/2021 12:00 AM     Problem: Diabetes, HTN, HLD, CKD, CAD      Long-Range Goal: Disease Progression Prevention   Recent Progress: On track  Priority: High  Note:   Current Barriers:  Unable to achieve control of diabetes  Suboptimal therapeutic regimen for diabetes  Pharmacist Clinical Goal(s):  Over the next 90 days, patient will achieve control of diabetes as evidenced by A1c  through collaboration with PharmD and provider.   Interventions: 1:1 collaboration with [name], MD regarding development and update of comprehensive plan of care as evidenced by provider attestation and co-signature Inter-disciplinary care team collaboration (see longitudinal plan of care) Comprehensive medication review performed; medication list updated in electronic medical record  Health Maintenance Yearly diabetic eye exam: up to date Yearly diabetic foot exam: up to date Urine microalbumin: up to date Yearly influenza vaccination: up to date Td/Tdap vaccination: up to date Pneumonia vaccination: due - discuss w/ PCP moving forward COVID vaccinations: due - but she can't remember when she got her last booster. Will  bring vaccination card to upcoming PCP appointment Shingrix vaccinations: due - plans to pursue in January  Colonoscopy: up to date Bone density scan: up to date Mammogram: up to date  Diabetes: Uncontrolled but more relaxed goal to prevent hypoglycemia likely appropriate; current treatment: Ozempic 1 mg weekly, Tresiba 22 units daily, Jardiance 25 mg daily; Resistant to metformin d/t things she has heard about lawsuits.  Reports sensors sometimes last ~ 12 days. Encouraged to call [employer] customer service for replacement  Current glucose readings: using Libre 2 Date of Download: 11/22-12/5/22 % Time CGM is active: 93% Average Glucose: 151 mg/dL Glucose Management Indicator: 6.9 Glucose Variability: 25.5 (goal <36%) Time in Goal:  - Time in range 70-180: 82% - Time above range: 18% - Time below range: 1% Reviewed CGM download. Dietary indiscretions periodically, but not with every meal.  Discussed elevations periodically after meals. Reports that she has higher carb meals at the end of the month before food stamps. Extensive  dietary education about portion sizes, choosing low carb options, monitoring portion sizes. Patient verbalized understanding.   Hypertension: Controlled per last office reading; current treatment: amlodipine 2.5 mg daily, losartan 50 mg daily  Current home readings: ~110s/60s, occasionally lower, but she is asymptomatic. Denies symptoms of lightheadedness, dizziness.  Continue current regimen  Hyperlipidemia and secondary ASCVD risk reduction (severe PVD) Controlled per last lipid panel at a goal of <55; current treatment: atorvastatin 20 mg daily  Antiplatelet regimen: aspirin 81 mg daily, clopidogrel 75 mg daily  S/p L lower extremity revascularization. Continue to follow with Vascular periodically  Recommended to continue current regimen at this time  Anxiety/Insomnia: Moderately well managed at this time; Current regimen: alprazolam 0.5 mg QPM PRN - using  almost daily recently Previously counseled on long term risks of benzodiazepines, PCP did as well. Continue current regimen at this time.    Peripheral Neuropathy: Moderately well controlled; current regimen: gabapentin 800 mg TID Max labeled dose per renal function is 1800 mg daily, but patient denies sedation, lower extremity edema. Appropriate to continue current regimen at this time given severity of vascular disease and benefit of pain control outweighing risk of side effects.  Previously recommended to continue current regimen as above.   Supplements: Vitamin D daily, multivitamin daily, Ocuvite, Biotin  Patient Goals/Self-Care Activities Over the next 90 days, patient will:  - take medications as prescribed check glucose three times daily using CGM, document, and provide at future appointments check blood pressure periodically, document, and provide at future appointments      Plan: Telephone follow up appointment with care management team member scheduled for:  3 months  [name], PharmD, [place], [place] Pharmacist [employer] at [employer] [phone]

## 2021-06-11 NOTE — Patient Instructions (Signed)
Laura Reed,   It was great talking to you today!  Focus on portion sizes of some of your carbohydrate choices. It's just periodically that you have elevations after meals, so I don't think we need to do anything with your medications right now.   Bring your COVID vaccination card with you to Dr. Lupita Dawn appointment so that we can help you make sure you are up to date on those recommendations.   In 2023, we recommend the Shingrix (shingles) vaccination.   Take care!  [name], PharmD  Visit Information  Following are the goals we discussed today:  Patient Goals/Self-Care Activities Over the next 90 days, patient will:  - take medications as prescribed check glucose three times daily using CGM, document, and provide at future appointments check blood pressure periodically, document, and provide at future appointments        Plan: Telephone follow up appointment with care management team member scheduled for:  3 months   [name], PharmD, [place], CPP Clinical Pharmacist [place] at [facility] [phone]     Please call the care guide team at [phone] if you need to cancel or reschedule your appointment.   Patient verbalizes understanding of instructions provided today and agrees to view in [place].

## 2021-06-12 ENCOUNTER — Other Ambulatory Visit (HOSPITAL_COMMUNITY): Payer: Self-pay

## 2021-06-12 ENCOUNTER — Telehealth: Payer: Self-pay | Admitting: Internal Medicine

## 2021-06-12 DIAGNOSIS — E1165 Type 2 diabetes mellitus with hyperglycemia: Secondary | ICD-10-CM

## 2021-06-12 DIAGNOSIS — Z794 Long term (current) use of insulin: Secondary | ICD-10-CM

## 2021-06-12 MED ORDER — OZEMPIC (1 MG/DOSE) 4 MG/3ML ~~LOC~~ SOPN
1.0000 mg | PEN_INJECTOR | SUBCUTANEOUS | 2 refills | Status: DC
  Filled 2021-06-12 – 2021-07-03 (×2): qty 3, 28d supply, fill #0

## 2021-06-12 NOTE — Telephone Encounter (Signed)
Patient returned office phone call. 

## 2021-06-12 NOTE — Telephone Encounter (Signed)
Pt called in regards to visit that was conducted yesterday 12/6 where pt was advised to call [employer] about needing another monitor due to pt not being able to read the meter. Pt stated she was advised she could possibly be given a sample for the freestyle libre 2 meter.

## 2021-06-12 NOTE — Telephone Encounter (Signed)
Contacted patient, left voicemail for her to return my call at her convenience.

## 2021-06-12 NOTE — Telephone Encounter (Signed)
Patient notes that when she called [employer] for replacement sensors, the lot number on the back on the reader was too small for her to read. She will have a friend later this week help her call and provide the information.   She also notes that [facility] told her they are unable to fill Ozempic at this time due to stocking issues. She is OK with [place] sending the Script to [facility] to mail to her. Provided phone number for [facility] for her to call and discuss mail order.

## 2021-06-14 DIAGNOSIS — B351 Tinea unguium: Secondary | ICD-10-CM | POA: Diagnosis not present

## 2021-06-14 DIAGNOSIS — Z794 Long term (current) use of insulin: Secondary | ICD-10-CM | POA: Diagnosis not present

## 2021-06-14 DIAGNOSIS — E1140 Type 2 diabetes mellitus with diabetic neuropathy, unspecified: Secondary | ICD-10-CM | POA: Diagnosis not present

## 2021-06-14 DIAGNOSIS — Z89421 Acquired absence of other right toe(s): Secondary | ICD-10-CM | POA: Diagnosis not present

## 2021-06-14 DIAGNOSIS — I739 Peripheral vascular disease, unspecified: Secondary | ICD-10-CM | POA: Diagnosis not present

## 2021-06-14 DIAGNOSIS — Z89422 Acquired absence of other left toe(s): Secondary | ICD-10-CM | POA: Diagnosis not present

## 2021-06-24 DIAGNOSIS — E1140 Type 2 diabetes mellitus with diabetic neuropathy, unspecified: Secondary | ICD-10-CM | POA: Diagnosis not present

## 2021-06-24 DIAGNOSIS — E1142 Type 2 diabetes mellitus with diabetic polyneuropathy: Secondary | ICD-10-CM | POA: Diagnosis not present

## 2021-06-25 ENCOUNTER — Other Ambulatory Visit (INDEPENDENT_AMBULATORY_CARE_PROVIDER_SITE_OTHER): Payer: Self-pay | Admitting: Vascular Surgery

## 2021-06-25 DIAGNOSIS — Z9889 Other specified postprocedural states: Secondary | ICD-10-CM

## 2021-06-27 ENCOUNTER — Encounter (INDEPENDENT_AMBULATORY_CARE_PROVIDER_SITE_OTHER): Payer: Medicare Other

## 2021-06-27 ENCOUNTER — Ambulatory Visit (INDEPENDENT_AMBULATORY_CARE_PROVIDER_SITE_OTHER): Payer: Medicare Other | Admitting: Vascular Surgery

## 2021-06-28 DIAGNOSIS — Z794 Long term (current) use of insulin: Secondary | ICD-10-CM | POA: Diagnosis not present

## 2021-06-28 DIAGNOSIS — E118 Type 2 diabetes mellitus with unspecified complications: Secondary | ICD-10-CM | POA: Diagnosis not present

## 2021-07-03 ENCOUNTER — Ambulatory Visit: Payer: Medicare Other | Admitting: Internal Medicine

## 2021-07-03 ENCOUNTER — Other Ambulatory Visit (HOSPITAL_COMMUNITY): Payer: Self-pay

## 2021-07-06 DIAGNOSIS — E1142 Type 2 diabetes mellitus with diabetic polyneuropathy: Secondary | ICD-10-CM | POA: Diagnosis not present

## 2021-07-16 ENCOUNTER — Other Ambulatory Visit: Payer: Self-pay | Admitting: Internal Medicine

## 2021-07-29 DIAGNOSIS — E118 Type 2 diabetes mellitus with unspecified complications: Secondary | ICD-10-CM | POA: Diagnosis not present

## 2021-07-29 DIAGNOSIS — Z794 Long term (current) use of insulin: Secondary | ICD-10-CM | POA: Diagnosis not present

## 2021-08-02 ENCOUNTER — Other Ambulatory Visit: Payer: Self-pay

## 2021-08-02 ENCOUNTER — Encounter: Payer: Self-pay | Admitting: Internal Medicine

## 2021-08-02 ENCOUNTER — Ambulatory Visit (INDEPENDENT_AMBULATORY_CARE_PROVIDER_SITE_OTHER): Payer: Medicare Other | Admitting: Internal Medicine

## 2021-08-02 VITALS — BP 136/84 | HR 77 | Temp 98.2°F | Resp 14 | Ht 66.93 in | Wt 145.8 lb

## 2021-08-02 DIAGNOSIS — E1151 Type 2 diabetes mellitus with diabetic peripheral angiopathy without gangrene: Secondary | ICD-10-CM | POA: Diagnosis not present

## 2021-08-02 DIAGNOSIS — I499 Cardiac arrhythmia, unspecified: Secondary | ICD-10-CM

## 2021-08-02 DIAGNOSIS — I10 Essential (primary) hypertension: Secondary | ICD-10-CM | POA: Diagnosis not present

## 2021-08-02 DIAGNOSIS — I498 Other specified cardiac arrhythmias: Secondary | ICD-10-CM

## 2021-08-02 NOTE — Patient Instructions (Signed)
Reduce tresiba to 20 units for now  to avoid recurrent low sugars in the morning   Your blood pressures are normal  but if the low number is consistently below 60 it may be time to have another ECHO by your new cardiologist

## 2021-08-02 NOTE — Progress Notes (Signed)
Subjective:  Patient ID: Laura Reed, female    DOB: 12-19-1960  Age: 61 y.o. MRN: [id number]  CC: The primary encounter diagnosis was Cardiac arrhythmia, unspecified cardiac arrhythmia type. Diagnoses of Controlled type 2 DM with peripheral circulatory disorder [facility]), Ventricular bigeminy, and Primary hypertension were also pertinent to this visit.   This visit occurred during the SARS-CoV-2 public health emergency.  Safety protocols were in place, including screening questions prior to the visit, additional usage of staff PPE, and extensive cleaning of exam room while observing appropriate contact time as indicated for disinfecting solutions.    HPI Laura Reed presents for follow up on multiple issues  Chief Complaint  Patient presents with   Follow-up    3 month diabetes f/u     1) T2DM:   she has been taking meds as directed and using a CBG monitor.  We reviewed  her sugars.  She has had recurrent early am lows recently   and most of her post prandials have been between 170 to 200  Lab Results  Component Value Date   HGBA1C 7.5 (H) 08/02/2021   2) Abnormal heart rhythm  she has had occasional feelings of a thumb in her chest,  without pain or shortness of breath.  The symptoms occur at rest and with mild exertion.   3) HTN:  Hypertension: patient checks blood pressure twice weekly at home.  Readings have been for the most part < 140/80 at rest . Patient is following a reduce salt diet most days and is taking medications as prescribed   Outpatient Medications Prior to Visit  Medication Sig Dispense Refill   acetaminophen (TYLENOL) 500 MG tablet Take 1,000 mg by mouth every 6 (six) hours as needed for mild pain or headache.      ALPRAZolam (XANAX) 0.5 MG tablet TAKE 1 TABLET(0.5 MG) BY MOUTH DAILY AS NEEDED FOR ANXIETY 30 tablet 5   amLODipine (NORVASC) 2.5 MG tablet Take 1 tablet (2.5 mg total) by mouth daily. 90 tablet 1   aspirin 81 MG chewable tablet Chew 81 mg by  mouth at bedtime.      atorvastatin (LIPITOR) 20 MG tablet TAKE 1 TABLET(20 MG) BY MOUTH DAILY 90 tablet 1   BIOTIN PO Take 1 tablet by mouth daily.     Cholecalciferol (VITAMIN D3) 2000 UNITS TABS Take 2,000 Units by mouth daily after supper.      clopidogrel (PLAVIX) 75 MG tablet TAKE 1 TABLET(75 MG) BY MOUTH DAILY 90 tablet 1   Continuous Blood Gluc Sensor (FREESTYLE LIBRE 2 SENSOR) MISC Use to check sugar at least 4 times daily 2 each 2   gabapentin (NEURONTIN) 800 MG tablet Take 1 tablet (800 mg total) by mouth 3 (three) times daily. 270 tablet 3   glucose blood (ACCU-CHEK AVIVA PLUS) test strip TEST BLOOD SUGAR FOUR TIMES DAILY 400 strip 1   insulin degludec (TRESIBA FLEXTOUCH) 100 UNIT/ML FlexTouch Pen Inject 22 Units into the skin daily. 30 mL 2   Insulin Pen Needle (PEN NEEDLES) 32G X 4 MM MISC Use to take insulin daily 100 each 3   JARDIANCE 25 MG TABS tablet TAKE 1 TABLET(25 MG) BY MOUTH DAILY BEFORE BREAKFAST 30 tablet 2   Lancets Misc. MISC Use to check blood sugars 4 times daily 400 each 3   losartan (COZAAR) 50 MG tablet TAKE 1 TABLET(50 MG) BY MOUTH DAILY 90 tablet 3   Multiple Vitamin (MULTIVITAMIN) tablet Take 1 tablet by mouth daily.  multivitamin-lutein (OCUVITE-LUTEIN) CAPS capsule Take 1 capsule by mouth daily.     Semaglutide, 1 MG/DOSE, (OZEMPIC, 1 MG/DOSE,) 4 MG/3ML SOPN Inject 1 mg into the skin once a week. 3 mL 2   No facility-administered medications prior to visit.    Review of Systems;  Patient denies headache, fevers, malaise, unintentional weight loss, skin rash, eye pain, sinus congestion and sinus pain, sore throat, dysphagia,  hemoptysis , cough, dyspnea, wheezing, chest pain, palpitations, orthopnea, edema, abdominal pain, nausea, melena, diarrhea, constipation, flank pain, dysuria, hematuria, urinary  Frequency, nocturia, numbness, tingling, seizures,  Focal weakness, Loss of consciousness,  Tremor, insomnia, depression, anxiety, and suicidal ideation.       Objective:  BP 136/84 (BP Location: Left Arm, Patient Position: Sitting, Cuff Size: Small)    Pulse 77    Temp 98.2 F (36.8 C) (Oral)    Resp 14    Ht 5' 6.93" (1.7 m)    Wt 145 lb 12.8 oz (66.1 kg)    SpO2 97%    BMI 22.88 kg/m   BP Readings from Last 3 Encounters:  08/02/21 136/84  04/12/21 (!) 98/58  03/28/21 (!) 152/68    Wt Readings from Last 3 Encounters:  08/02/21 145 lb 12.8 oz (66.1 kg)  06/10/21 144 lb (65.3 kg)  04/12/21 144 lb (65.3 kg)    General appearance: alert, cooperative and appears stated age Ears: normal TM's and external ear canals both ears Throat: lips, mucosa, and tongue normal; teeth and gums normal Neck: no adenopathy, no carotid bruit, supple, symmetrical, trachea midline and thyroid not enlarged, symmetric, no tenderness/mass/nodules Back: symmetric, no curvature. ROM normal. No CVA tenderness. Lungs: clear to auscultation bilaterally Heart: regular rate and rhythm, S1, S2 normal, no murmur, click, rub or gallop Abdomen: soft, non-tender; bowel sounds normal; no masses,  no organomegaly Pulses: 2+ and symmetric Skin: Skin color, texture, turgor normal. No rashes or lesions Lymph nodes: Cervical, supraclavicular, and axillary nodes normal.  Lab Results  Component Value Date   HGBA1C 7.5 (H) 08/02/2021   HGBA1C 7.3 (H) 03/28/2021   HGBA1C 7.6 (H) 11/15/2020    Lab Results  Component Value Date   CREATININE 0.99 08/02/2021   CREATININE 0.95 03/28/2021   CREATININE 0.99 12/04/2020    Lab Results  Component Value Date   WBC 9.2 03/15/2020   HGB 13.6 03/15/2020   HCT 37.4 03/15/2020   PLT 274 03/15/2020   GLUCOSE 178 (H) 08/02/2021   CHOL 106 08/02/2021   TRIG 107 08/02/2021   HDL 49 08/02/2021   LDLDIRECT 47.0 01/28/2017   LDLCALC 37 08/02/2021   ALT 21 08/02/2021   AST 19 08/02/2021   NA 136 08/02/2021   K 4.7 08/02/2021   CL 100 08/02/2021   CREATININE 0.99 08/02/2021   BUN 25 08/02/2021   CO2 23 08/02/2021   TSH 0.946  08/02/2021   INR 0.9 10/17/2014   HGBA1C 7.5 (H) 08/02/2021   MICROALBUR 2.0 (H) 11/15/2020    PERIPHERAL VASCULAR CATHETERIZATION  Result Date: 12/04/2020 See Op Note   Assessment & Plan:   Problem List Items Addressed This Visit     Controlled type 2 DM with peripheral circulatory disorder (HCC)    Slight loss of control since  reducing insulin use withTresiba at 22 units, addition of Ozempic  1 mg weekly , Jardiance now at 25 mg daily .  She  has mild microalbuminuria. Patient is tolerating statin therapy for CAD risk reduction and on ACE/ARB for renal protection and  hypertension.  She will need to add mealtime insulin if a1c  Is > 7.5 in 3 months     Lab Results  Component Value Date   HGBA1C 7.5 (H) 08/02/2021   Lab Results  Component Value Date   LABMICR See below: 03/27/2021   LABMICR See below: 09/19/2020   MICROALBUR 2.0 (H) 11/15/2020   MICROALBUR 18.0 (H) 05/26/2019          Relevant Orders   Comprehensive metabolic panel (Completed)   Hemoglobin A1c (Completed)   Lipid panel (Completed)   Hypertension    Well controlled on current regimen. Renal function stable, no changes today.  Lab Results  Component Value Date   CREATININE 0.99 08/02/2021   Lab Results  Component Value Date   NA 136 08/02/2021   K 4.7 08/02/2021   CL 100 08/02/2021   CO2 23 08/02/2021         Cardiac arrhythmia - Primary    I have ordered and reviewed a 12 lead EKG ;  She has a preexisting RBBB and a new LAFB and recurrent PVC's.   Lab Results  Component Value Date   TSH 0.946 08/02/2021   Lab Results  Component Value Date   WBC 9.2 03/15/2020   HGB 13.6 03/15/2020   HCT 37.4 03/15/2020   MCV 81.3 03/15/2020   PLT 274 03/15/2020   .la      Relevant Orders   EKG 12-Lead (Completed)   TSH (Completed)   Ventricular bigeminy    Noted on today's EKG,  With an incomplete RBBB and an anterior fascicular block.  Checking lytes, thyroid, will contact future  cardiologist for earlier appt      Relevant Orders   Magnesium (Completed)    I spent 30 minutes dedicated to the care of this patient on the date of this encounter to include pre-visit review of patient's medical history,  most recent imaging studies, Face-to-face time with the patient , and post visit ordering of testing and therapeutics.    Follow-up: No follow-ups on file.   [name], MD

## 2021-08-02 NOTE — Assessment & Plan Note (Signed)
Noted on today's EKG,  With an incomplete RBBB and an anterior fascicular block.  Checking lytes, thyroid, will contact future cardiologist for earlier appt

## 2021-08-03 LAB — COMPREHENSIVE METABOLIC PANEL
ALT: 21 IU/L (ref 0–32)
AST: 19 IU/L (ref 0–40)
Albumin/Globulin Ratio: 1.9 (ref 1.2–2.2)
Albumin: 4.3 g/dL (ref 3.8–4.8)
Alkaline Phosphatase: 98 IU/L (ref 44–121)
BUN/Creatinine Ratio: 25 (ref 12–28)
BUN: 25 mg/dL (ref 8–27)
Bilirubin Total: 0.3 mg/dL (ref 0.0–1.2)
CO2: 23 mmol/L (ref 20–29)
Calcium: 8.9 mg/dL (ref 8.7–10.3)
Chloride: 100 mmol/L (ref 96–106)
Creatinine, Ser: 0.99 mg/dL (ref 0.57–1.00)
Globulin, Total: 2.3 g/dL (ref 1.5–4.5)
Glucose: 178 mg/dL — ABNORMAL HIGH (ref 70–99)
Potassium: 4.7 mmol/L (ref 3.5–5.2)
Sodium: 136 mmol/L (ref 134–144)
Total Protein: 6.6 g/dL (ref 6.0–8.5)
eGFR: 64 mL/min/{1.73_m2} (ref >59)

## 2021-08-03 LAB — LIPID PANEL
Chol/HDL Ratio: 2.2 ratio (ref 0.0–4.4)
Cholesterol, Total: 106 mg/dL (ref 100–199)
HDL: 49 mg/dL (ref >39)
LDL Chol Calc (NIH): 37 mg/dL (ref 0–99)
Triglycerides: 107 mg/dL (ref 0–149)
VLDL Cholesterol Cal: 20 mg/dL (ref 5–40)

## 2021-08-03 LAB — HEMOGLOBIN A1C
Est. average glucose Bld gHb Est-mCnc: 169 mg/dL
Hgb A1c MFr Bld: 7.5 % — ABNORMAL HIGH (ref 4.8–5.6)

## 2021-08-03 LAB — MAGNESIUM: Magnesium: 2.3 mg/dL (ref 1.6–2.3)

## 2021-08-03 LAB — TSH: TSH: 0.946 u[IU]/mL (ref 0.450–4.500)

## 2021-08-04 NOTE — Assessment & Plan Note (Signed)
Well controlled on current regimen. Renal function stable, no changes today.  Lab Results  Component Value Date   CREATININE 0.99 08/02/2021   Lab Results  Component Value Date   NA 136 08/02/2021   K 4.7 08/02/2021   CL 100 08/02/2021   CO2 23 08/02/2021

## 2021-08-04 NOTE — Assessment & Plan Note (Signed)
Slight loss of control since  reducing insulin use withTresiba at 22 units, addition of Ozempic  1 mg weekly , Jardiance now at 25 mg daily .  She  has mild microalbuminuria. Patient is tolerating statin therapy for CAD risk reduction and on ACE/ARB for renal protection and hypertension.  She will need to add mealtime insulin if a1c  Is > 7.5 in 3 months     Lab Results  Component Value Date   HGBA1C 7.5 (H) 08/02/2021    Lab Results  Component Value Date   LABMICR See below: 03/27/2021   LABMICR See below: 09/19/2020   MICROALBUR 2.0 (H) 11/15/2020   MICROALBUR 18.0 (H) 05/26/2019

## 2021-08-04 NOTE — Assessment & Plan Note (Addendum)
I have ordered and reviewed a 12 lead EKG ;  She has a preexisting RBBB and a new LAFB and recurrent PVC's.   Electrolytes, hgb and thyroid function are all normal and she is asymptomatic. Given her 53 y of CAD , I have advised her to follow up with her cardiologist sooner than May , her next scheduled appt.   Lab Results  Component Value Date   TSH 0.946 08/02/2021   Lab Results  Component Value Date   WBC 9.2 03/15/2020   HGB 13.6 03/15/2020   HCT 37.4 03/15/2020   MCV 81.3 03/15/2020   PLT 274 03/15/2020   .la

## 2021-08-08 DIAGNOSIS — E113512 Type 2 diabetes mellitus with proliferative diabetic retinopathy with macular edema, left eye: Secondary | ICD-10-CM | POA: Diagnosis not present

## 2021-08-10 ENCOUNTER — Other Ambulatory Visit: Payer: Self-pay | Admitting: Internal Medicine

## 2021-08-25 NOTE — Progress Notes (Signed)
MRN : [id number]  Laura Reed is a 61 y.o. (01/12/1961) female who presents with chief complaint of check circulation.  History of Present Illness:   The patient returns to the office for followup and review status post angiogram with intervention on 12/04/2020.    Procedure: Percutaneous transluminal angioplasty and stent placement left popliteal arteries to 6 mm 2.   Percutaneous transluminal angioplasty left anterior tibial to 2.5 mm 3.   Mechanical thrombectomy using the [place] catheter left popliteal artery. 4.   Mechanical thrombectomy using the penumbra CAT 6 device left anterior tibial artery.   The patient notes improvement in the lower extremity symptoms. No interval shortening of the patient's claudication distance or rest pain symptoms. Previous wounds have now healed.  No new ulcers or wounds have occurred since the last visit.   There have been no significant changes to the patient's overall health care.   The patient denies amaurosis fugax or recent TIA symptoms. There are no recent neurological changes noted. The patient denies history of DVT, PE or superficial thrombophlebitis. The patient denies recent episodes of angina or shortness of breath.    ABI's Rt=0.98 and Lt=0.95 (previous ABI's Rt=1.02 and Lt=1.06) Duplex US of the left lower extremity arterial system shows patent bypass and stent   No outpatient medications have been marked as taking for the 08/26/21 encounter (Appointment) with Delana Meyer, Dolores Lory, MD.    Past Medical History:  Diagnosis Date   Absence of kidney    left   Anxiety    Arthritis    Bladder cancer ([place])    CHF (congestive heart failure) (HCC)    Complication of anesthesia    BP HAS  RUN LOW AFTER SURGERY-LUNGS FILLED UP WITH FLUID AFTER  LEG STENT SURGERY    Coronary artery disease    Diabetes mellitus    Family history of adverse reaction to anesthesia    Sister - PONV   GERD (gastroesophageal reflux disease)    OCC TUMS    Heart murmur    Hemorrhoid    History of methicillin resistant staphylococcus aureus (MRSA) 2007   Hypertension    Neuropathy    PVD (peripheral vascular disease) (HCC)    Thyroid nodule    right   Urothelial carcinoma of kidney ([place]) 10/31/2014   INVASIVE UROTHELIAL CARCINOMA, LOW GRADE. T1, Nx.   Wears dentures    full upper and lower    Past Surgical History:  Procedure Laterality Date   AMPUTATION TOE     right (4th and 5th); left (great toe, 3rd)   AMPUTATION TOE Right 07/16/2018   Procedure: AMPUTATION TOE/MPJ right 2nd;  Surgeon: Sharlotte Alamo, DPM;  Location: [facility];  Service: Podiatry;  Laterality: Right;   ARTERIAL BYPASS SURGRY  2009, 2013 x 2   right leg , done in [place] Right 01/2014   Dr Delana Meyer   CATARACT EXTRACTION W/PHACO Right 12/14/2014   Procedure: CATARACT EXTRACTION PHACO AND INTRAOCULAR LENS PLACEMENT ([place]);  Surgeon: Lyla Glassing, MD;  Location: [facility];  Service: Ophthalmology;  Laterality: Right;  [place]   00:38.6              AP        7.1                   CDE  2.76   CATARACT EXTRACTION W/PHACO Left 12/06/2019   Procedure: CATARACT EXTRACTION PHACO AND INTRAOCULAR  LENS PLACEMENT ([facility]) LEFT DIABETIC;  Surgeon: Birder Robson, MD;  Location: [place];  Service: Ophthalmology;  Laterality: Left;  9.08 1:06.4   CESAREAN SECTION     CHOLECYSTECTOMY  03-03-12   Porcelain gallbladder, gallstones,  [place]   COLONOSCOPY W/ BIOPSIES  04/28/2012   Hyperplastic rectal polyps.   CORONARY ARTERY BYPASS GRAFT  2009   3 vessel   CYSTOSCOPY W/ RETROGRADES Right 09/01/2016   Procedure: CYSTOSCOPY WITH RETROGRADE PYELOGRAM;  Surgeon: Hollice Espy, MD;  Location: [facility];  Service: Urology;  Laterality: Right;   CYSTOSCOPY W/ RETROGRADES Bilateral 03/19/2020   Procedure: CYSTOSCOPY WITH RETROGRADE PYELOGRAM;  Surgeon: Hollice Espy, MD;  Location: [facility];  Service: Urology;  Laterality: Bilateral;    CYSTOSCOPY WITH BIOPSY N/A 03/19/2020   Procedure: CYSTOSCOPY WITH BIOPSY;  Surgeon: Hollice Espy, MD;  Location: [facility];  Service: Urology;  Laterality: N/A;   EYE SURGERY     HERNIA REPAIR  10-31-14   ventral, retro-rectus atrium mesh   IRRIGATION AND DEBRIDEMENT FOOT Left 01/18/2019   Procedure: IRRIGATION AND DEBRIDEMENT FOOT;  Surgeon: Sharlotte Alamo, DPM;  Location: [facility];  Service: Podiatry;  Laterality: Left;   LOWER EXTREMITY ANGIOGRAPHY Left 12/10/2016   Procedure: Lower Extremity Angiography;  Surgeon: Katha Cabal, MD;  Location: [place] [facility];  Service: Cardiovascular;  Laterality: Left;   LOWER EXTREMITY ANGIOGRAPHY Left 02/02/2018   Procedure: LOWER EXTREMITY ANGIOGRAPHY;  Surgeon: Katha Cabal, MD;  Location: [place] [facility];  Service: Cardiovascular;  Laterality: Left;   LOWER EXTREMITY ANGIOGRAPHY Left 05/05/2018   Procedure: LOWER EXTREMITY ANGIOGRAPHY;  Surgeon: Katha Cabal, MD;  Location: [place] [facility];  Service: Cardiovascular;  Laterality: Left;   LOWER EXTREMITY ANGIOGRAPHY Left 12/04/2020   Procedure: LOWER EXTREMITY ANGIOGRAPHY with Intervention;  Surgeon: Katha Cabal, MD;  Location: [place] [facility];  Service: Cardiovascular;  Laterality: Left;   NEPHRECTOMY Left 10-31-14   PERIPHERAL VASCULAR CATHETERIZATION Left 05/01/2015   Procedure: Lower Extremity Angiography;  Surgeon: Katha Cabal, MD;  Location: [place] [facility];  Service: Cardiovascular;  Laterality: Left;   PERIPHERAL VASCULAR CATHETERIZATION  05/01/2015   Procedure: Lower Extremity Intervention;  Surgeon: Katha Cabal, MD;  Location: [place] [facility];  Service: Cardiovascular;;   PERIPHERAL VASCULAR CATHETERIZATION Left 02/20/2015   Procedure: Pelvic Angiography;  Surgeon: Katha Cabal, MD;  Location: [place] [facility];  Service: Cardiovascular;  Laterality: Left;   TRANSURETHRAL RESECTION OF BLADDER TUMOR WITH MITOMYCIN-C N/A  09/01/2016   Procedure: TRANSURETHRAL RESECTION OF BLADDER TUMOR WITH MITOMYCIN-C;  Surgeon: Hollice Espy, MD;  Location: [facility];  Service: Urology;  Laterality: N/A;    Social History Social History   Tobacco Use   Smoking status: Former    Packs/day: 2.00    Years: 35.00    Pack years: 70.00    Types: Cigarettes    Quit date: 03/29/2013    Years since quitting: 8.4   Smokeless tobacco: Never  Vaping Use   Vaping Use: Former  Substance Use Topics   Alcohol use: Not Currently    Alcohol/week: 0.0 standard drinks    Comment: LAST DRINK 2009   Drug use: Not Currently    Types: Cocaine    Comment: last used in 2007    Family History Family History  Problem Relation Age of Onset   Cancer Mother 16       Lung Cancer   Cancer Father 60  Lung Ca   Diabetes Son    Breast cancer Maternal Grandmother    Kidney cancer Neg Hx    Bladder Cancer Neg Hx    Prostate cancer Neg Hx     No Known Allergies   REVIEW OF SYSTEMS (Negative unless checked)  Constitutional: [] Weight loss  [] Fever  [] Chills Cardiac: [] Chest pain   [] Chest pressure   [] Palpitations   [] Shortness of breath when laying flat   [] Shortness of breath with exertion. Vascular:  [x] Pain in legs with walking   [] Pain in legs at rest  [] History of DVT   [] Phlebitis   [] Swelling in legs   [] Varicose veins   [] Non-healing ulcers Pulmonary:   [] Uses home oxygen   [] Productive cough   [] Hemoptysis   [] Wheeze  [] COPD   [] Asthma Neurologic:  [] Dizziness   [] Seizures   [] History of stroke   [] History of TIA  [] Aphasia   [] Vissual changes   [] Weakness or numbness in arm   [] Weakness or numbness in leg Musculoskeletal:   [] Joint swelling   [] Joint pain   [] Low back pain Hematologic:  [] Easy bruising  [] Easy bleeding   [] Hypercoagulable state   [] Anemic Gastrointestinal:  [] Diarrhea   [] Vomiting  [] Gastroesophageal reflux/heartburn   [] Difficulty swallowing. Genitourinary:  [] Chronic kidney disease   [] Difficult  urination  [] Frequent urination   [] Blood in urine Skin:  [] Rashes   [] Ulcers  Psychological:  [] History of anxiety   []  History of major depression.  Physical Examination  There were no vitals filed for this visit. There is no height or weight on file to calculate BMI. Gen: WD/WN, NAD Head: [place]/AT, No temporalis wasting.  Ear/Nose/Throat: Hearing grossly intact, nares w/o erythema or drainage Eyes: PER, EOMI, sclera nonicteric.  Neck: Supple, no masses.  No bruit or JVD.  Pulmonary:  Good air movement, no audible wheezing, no use of accessory muscles.  Cardiac: RRR, normal S1, S2, no Murmurs. Vascular:   Vessel Right Left  Radial Palpable Palpable  Carotid Palpable Palpable  PT Not Palpable Not Palpable  DP Not Palpable Palpable  Gastrointestinal: soft, non-distended. No guarding/no peritoneal signs.  Musculoskeletal: M/S 5/5 throughout.  No visible deformity.  Neurologic: CN 2-12 intact. Pain and light touch intact in extremities.  Symmetrical.  Speech is fluent. Motor exam as listed above. Psychiatric: Judgment intact, Mood & affect appropriate for pt's clinical situation. Dermatologic: No rashes or ulcers noted.  No changes consistent with cellulitis.   CBC Lab Results  Component Value Date   WBC 9.2 03/15/2020   HGB 13.6 03/15/2020   HCT 37.4 03/15/2020   MCV 81.3 03/15/2020   PLT 274 03/15/2020    BMET    Component Value Date/Time   NA 136 08/02/2021 1629   NA 135 11/02/2014 0609   K 4.7 08/02/2021 1629   K 4.2 11/02/2014 0609   CL 100 08/02/2021 1629   CL 107 11/02/2014 0609   CO2 23 08/02/2021 1629   CO2 23 11/02/2014 0609   GLUCOSE 178 (H) 08/02/2021 1629   GLUCOSE 145 (H) 03/28/2021 1026   GLUCOSE 108 (H) 11/02/2014 0609   BUN 25 08/02/2021 1629   BUN 20 11/02/2014 0609   CREATININE 0.99 08/02/2021 1629   CREATININE 1.01 11/09/2015 1549   CREATININE 1.01 11/09/2015 1549   CALCIUM 8.9 08/02/2021 1629   CALCIUM 7.3 (L) 11/02/2014 0609   GFRNONAA >60  12/04/2020 0921   GFRNONAA 50 (L) 11/02/2014 0609   GFRAA >60 01/19/2019 0357   GFRAA 58 (L) 11/02/2014 8242  CrCl cannot be calculated (Patient's most recent lab result is older than the maximum 21 days allowed.).  COAG Lab Results  Component Value Date   INR 0.9 10/17/2014   INR 1.1 01/19/2014   INR 0.9 12/06/2013    Radiology No results found.   Assessment/Plan 1. Atherosclerosis of native artery of both lower extremities with intermittent claudication (HCC) Recommend:   The patient is status post successful angiogram with intervention.  The patient reports that the claudication symptoms and leg pain is essentially gone.   The patient denies lifestyle limiting changes at this point in time.   No further invasive studies, angiography or surgery at this time The patient should continue walking and begin a more formal exercise program.  The patient should continue antiplatelet therapy and aggressive treatment of the lipid abnormalities   The patient should continue wearing graduated compression socks 10-15 mmHg strength to control the mild edema.   Patient should undergo noninvasive studies as ordered. The patient will follow up with me after the studies. - VAS [place] LOWER EXTREMITY ARTERIAL DUPLEX; Future - VAS [place] ABI WITH/WO TBI; Future  2. Bilateral carotid artery stenosis Recommend:   Given the patient's asymptomatic subcritical stenosis no further invasive testing or surgery at this time.   Duplex ultrasound shows JOIN=<86% and LICA =76-72%.   Continue antiplatelet therapy as prescribed Continue management of CAD, HTN and Hyperlipidemia Healthy heart diet,  encouraged exercise at least 4 times per week Follow up in 3 months with duplex ultrasound and physical exam  - VAS US CAROTID; Future  3. Coronary artery disease of native artery of native heart with stable angina pectoris (HCC) Continue cardiac and antihypertensive medications as already ordered and reviewed,  no changes at this time.  Continue statin as ordered and reviewed, no changes at this time  Nitrates PRN for chest pain   4. Primary hypertension Continue antihypertensive medications as already ordered, these medications have been reviewed and there are no changes at this time.   5. Controlled type 2 DM with peripheral circulatory disorder (HCC) Continue hypoglycemic medications as already ordered, these medications have been reviewed and there are no changes at this time.  Hgb A1C to be monitored as already arranged by primary service     Hortencia Pilar, MD  08/25/2021 4:59 PM

## 2021-08-26 ENCOUNTER — Other Ambulatory Visit: Payer: Self-pay

## 2021-08-26 ENCOUNTER — Ambulatory Visit (INDEPENDENT_AMBULATORY_CARE_PROVIDER_SITE_OTHER): Payer: Medicare Other

## 2021-08-26 ENCOUNTER — Encounter (INDEPENDENT_AMBULATORY_CARE_PROVIDER_SITE_OTHER): Payer: Self-pay | Admitting: Vascular Surgery

## 2021-08-26 ENCOUNTER — Ambulatory Visit (INDEPENDENT_AMBULATORY_CARE_PROVIDER_SITE_OTHER): Payer: Medicare Other | Admitting: Vascular Surgery

## 2021-08-26 VITALS — BP 131/84 | HR 80 | Ht 67.0 in | Wt 140.0 lb

## 2021-08-26 DIAGNOSIS — Z9889 Other specified postprocedural states: Secondary | ICD-10-CM

## 2021-08-26 DIAGNOSIS — I739 Peripheral vascular disease, unspecified: Secondary | ICD-10-CM

## 2021-08-26 DIAGNOSIS — I10 Essential (primary) hypertension: Secondary | ICD-10-CM | POA: Diagnosis not present

## 2021-08-26 DIAGNOSIS — I25118 Atherosclerotic heart disease of native coronary artery with other forms of angina pectoris: Secondary | ICD-10-CM | POA: Diagnosis not present

## 2021-08-26 DIAGNOSIS — I6523 Occlusion and stenosis of bilateral carotid arteries: Secondary | ICD-10-CM | POA: Diagnosis not present

## 2021-08-26 DIAGNOSIS — E1151 Type 2 diabetes mellitus with diabetic peripheral angiopathy without gangrene: Secondary | ICD-10-CM | POA: Diagnosis not present

## 2021-08-26 DIAGNOSIS — I70213 Atherosclerosis of native arteries of extremities with intermittent claudication, bilateral legs: Secondary | ICD-10-CM

## 2021-08-28 ENCOUNTER — Other Ambulatory Visit: Payer: Self-pay | Admitting: Internal Medicine

## 2021-08-28 ENCOUNTER — Encounter (INDEPENDENT_AMBULATORY_CARE_PROVIDER_SITE_OTHER): Payer: Self-pay | Admitting: Vascular Surgery

## 2021-08-29 DIAGNOSIS — E118 Type 2 diabetes mellitus with unspecified complications: Secondary | ICD-10-CM | POA: Diagnosis not present

## 2021-08-29 DIAGNOSIS — Z794 Long term (current) use of insulin: Secondary | ICD-10-CM | POA: Diagnosis not present

## 2021-09-02 ENCOUNTER — Other Ambulatory Visit: Payer: Self-pay | Admitting: Internal Medicine

## 2021-09-02 DIAGNOSIS — E1165 Type 2 diabetes mellitus with hyperglycemia: Secondary | ICD-10-CM

## 2021-09-02 DIAGNOSIS — Z794 Long term (current) use of insulin: Secondary | ICD-10-CM

## 2021-09-04 ENCOUNTER — Ambulatory Visit
Admission: RE | Admit: 2021-09-04 | Discharge: 2021-09-04 | Disposition: A | Discharge status: Home / Self Care | Payer: Medicare Other | Source: Ambulatory Visit | Attending: Internal Medicine | Admitting: Internal Medicine

## 2021-09-04 ENCOUNTER — Other Ambulatory Visit: Payer: Self-pay

## 2021-09-04 DIAGNOSIS — M8589 Other specified disorders of bone density and structure, multiple sites: Secondary | ICD-10-CM | POA: Diagnosis not present

## 2021-09-04 DIAGNOSIS — Z1231 Encounter for screening mammogram for malignant neoplasm of breast: Secondary | ICD-10-CM | POA: Diagnosis not present

## 2021-09-04 DIAGNOSIS — Z780 Asymptomatic menopausal state: Secondary | ICD-10-CM | POA: Insufficient documentation

## 2021-09-06 ENCOUNTER — Telehealth: Payer: Self-pay | Admitting: Internal Medicine

## 2021-09-06 MED ORDER — SULFAMETHOXAZOLE-TRIMETHOPRIM 800-160 MG PO TABS: 1.0000 | ORAL_TABLET | Freq: Two times a day (BID) | ORAL | 0 refills | Status: DC

## 2021-09-06 NOTE — Telephone Encounter (Signed)
Spoke with pt and informed her that an antibiotic has been sent in. Pt gave a verbal understanding.  ?

## 2021-09-06 NOTE — Telephone Encounter (Signed)
Pt has something on her chest that she showed the doctor on her last visit in January and the provider said that if it came back again she was going to prescribe some antibiotics ?

## 2021-09-06 NOTE — Telephone Encounter (Signed)
I do not see mention of this in the last office note. Are you aware of what the pt is talking about?  ?

## 2021-09-10 ENCOUNTER — Ambulatory Visit: Payer: Self-pay | Admitting: Pharmacist

## 2021-09-10 NOTE — Chronic Care Management (AMB) (Signed)
?  Chronic Care Management  ? ?Note ? ?09/10/2021 ?Name: Laura Reed MRN: [id number] DOB: 04-06-1961 ? ? ? ?Closing pharmacy CCM case at this time.  Patient has clinic contact information for future questions or concerns.  ? ?[name], PharmD, [place], CPP ?Clinical Pharmacist ?Therapist, music at [facility] [employer] ?[phone] ? ?

## 2021-09-11 ENCOUNTER — Ambulatory Visit: Payer: Medicare Other | Admitting: Pharmacist

## 2021-09-11 DIAGNOSIS — I739 Peripheral vascular disease, unspecified: Secondary | ICD-10-CM

## 2021-09-11 DIAGNOSIS — E1165 Type 2 diabetes mellitus with hyperglycemia: Secondary | ICD-10-CM

## 2021-09-11 DIAGNOSIS — I10 Essential (primary) hypertension: Secondary | ICD-10-CM

## 2021-09-11 MED ORDER — SEMAGLUTIDE (2 MG/DOSE) 8 MG/3ML ~~LOC~~ SOPN: 2.0000 mg | PEN_INJECTOR | SUBCUTANEOUS | 5 refills | Status: DC

## 2021-09-11 MED ORDER — TRESIBA FLEXTOUCH 100 UNIT/ML ~~LOC~~ SOPN: 16.0000 [IU] | PEN_INJECTOR | Freq: Every day | SUBCUTANEOUS | 2 refills | Status: DC

## 2021-09-11 NOTE — Progress Notes (Signed)
? ?   ? ?Chief Complaint  ?Patient presents with  ? Diabetes  ? ? ?Laura Reed is a 61 y.o. year old female who was referred for medication management by their primary care provider, [name], MD. They presented for a telephone visit in the context of the COVID-19 pandemic. ? ? ?Subjective: ?Diabetes: ? ?Current medications:  Ozempic 1 mg weekly. Tresiba 20 units daily, Jardiance 25 mg daily ?Medications tried in the past: Resistant to metformin d/t things she has heard about lawsuits ? ?Current glucose readings: using Libre 2 CGM ? ?Date of Download: 2/22-09/10/21 ?% Time CGM is active: 97% ?Average Glucose: 154 mg/dL ?Glucose Management Indicator: 7.0  ?Glucose Variability: 19.8 (goal <36%) ?Time in Goal:  ?- Time in range 70-180: 81% ?- Time above range: 19% ?- Time below range: 0% ?Observed patterns: post prandial elevations, particularly some overnight snacking. Reports she has gotten some Easter candy and is  ? ?Current medication access support: none ? ?Hypertension: ? ?Current medications: amlodipine 2.5 mg daily, losartan 50 mg daily ? ?Current blood pressure readings readings: home readings ranging 90-130s/60-80s, though is using a wrist cuff. Has an old arm cuff, is unsure of brand but got from her insurance benefits.  ? ?Patient does report hypotensive s/sx including dizziness, lightheadedness sometimes, but not frequently. ? ?Osteoporosis: ? ?Current medications: none ?Medications tried in the past: none ? ?Current supplements: Vitamin D 2000  ? ?Current physical activity: working on increasing physical activity ? ?Most recent DEXA: 09/04/21 - osteopenia; FRAX score major osteoporotic fracture of 7.6%, hip fracture of 0.7% in the last 10 years.  ? ?  ? ?Objective: ?Lab Results  ?Component Value Date  ? HGBA1C 7.5 (H) 08/02/2021  ? ? ?Lab Results  ?Component Value Date  ? CREATININE 0.99 08/02/2021  ? BUN 25 08/02/2021  ? NA 136 08/02/2021  ? K 4.7 08/02/2021  ? CL 100 08/02/2021  ? CO2 23  08/02/2021  ? ? ?Lab Results  ?Component Value Date  ? CHOL 106 08/02/2021  ? HDL 49 08/02/2021  ? [place] 37 08/02/2021  ? LDLDIRECT 47.0 01/28/2017  ? TRIG 107 08/02/2021  ? CHOLHDL 2.2 08/02/2021  ? ? ?Medications Reviewed Today   ? ? Reviewed by De Hollingshead, RPH-CPP (Pharmacist) on 09/11/21 at 1114  Med List Status: <None>  ? ?Medication Order Taking? Sig Documenting Provider Last Dose Status Informant  ?acetaminophen (TYLENOL) 500 MG tablet [id number] Yes Take 1,000 mg by mouth every 6 (six) hours as needed for mild pain or headache.  [provider] Taking Active Self  ?         ?Med Note ([place] Sep 25, 2020 10:02 AM)    ?ALPRAZolam (XANAX) 0.5 MG tablet [id number] Yes TAKE 1 TABLET(0.5 MG) BY MOUTH DAILY AS NEEDED FOR ANXIETY [name], MD Taking Active   ?amLODipine (NORVASC) 2.5 MG tablet [id number] Yes Take 1 tablet (2.5 mg total) by mouth daily. [name], MD Taking Active   ?aspirin 81 MG chewable tablet [id number] Yes Chew 81 mg by mouth at bedtime.  [provider] Taking Active Self  ?atorvastatin (LIPITOR) 20 MG tablet [id number] Yes TAKE 1 TABLET(20 MG) BY MOUTH DAILY [name], MD Taking Active   ?BIOTIN PO [id number] Yes Take 1 tablet by mouth daily. [provider] Taking Active   ?Cholecalciferol (VITAMIN D3) 2000 UNITS TABS [id number] Yes Take 2,000 Units by mouth daily after supper.  [provider]  Taking Active Self  ?clopidogrel (PLAVIX) 75 MG tablet [id number] Yes TAKE 1 TABLET(75 MG) BY MOUTH DAILY [name], MD Taking Active   ?Continuous Blood Gluc Sensor (FREESTYLE LIBRE 2 SENSOR) [place] [id number]  Use to check sugar at least 4 times daily [name], MD  Active   ?gabapentin (NEURONTIN) 800 MG tablet [id number] Yes TAKE 1 TABLET(800 MG) BY MOUTH THREE TIMES DAILY [name], MD Taking Active   ?insulin degludec (TRESIBA FLEXTOUCH) 100 UNIT/ML FlexTouch Pen [id number] Yes Inject 22 Units into the skin  daily. [name], MD Taking Active   ?Insulin Pen Needle (PEN NEEDLES) 32G X 4 MM MISC [id number]  Use to take insulin daily [name], MD  Active   ?JARDIANCE 25 MG TABS tablet [id number] Yes TAKE 1 TABLET(25 MG) BY MOUTH DAILY BEFORE AND BREAKFAST [name], MD Taking Active   ?losartan (COZAAR) 50 MG tablet [id number] Yes TAKE 1 TABLET(50 MG) BY MOUTH DAILY [name], MD Taking Active   ?Multiple Vitamin (MULTIVITAMIN) tablet [id number] Yes Take 1 tablet by mouth daily. [provider] Taking Active   ?multivitamin-lutein (OCUVITE-LUTEIN) CAPS capsule [id number] Yes Take 1 capsule by mouth daily. [provider] Taking Active   ?Semaglutide, 1 MG/DOSE, (OZEMPIC, 1 MG/DOSE,) 4 MG/3ML SOPN [id number] Yes Inject 1 mg into the skin once a week. [name], MD Taking Active   ?sulfamethoxazole-trimethoprim (BACTRIM DS) 800-160 MG tablet [id number] Yes Take 1 tablet by mouth 2 (two) times daily. [name], MD Taking Active   ? ?  ?  ? ?  ? ? ?Assessment/Plan:  ? ?Diabetes: ?- Currently uncontrolled ?- Reviewed goal A1c, goal fasting, and goal 2 hour post prandial glucose ?- Discussed possibility of increasing Ozempic. Patient is concerned about losing more weight, but is interested in trying. Increase Ozempic to 2 mg weekly. Reduce Tresiba to 16 units daily. Continue Jardiance 25 mg daily. Call the office if development of hypoglyceima ?- Recommend to continue to use CGM to check glucose.  ? ? ?Hypertension: ?- Currently controlled at goal <140/80, often <130/80 ?- Patient notes dehydration may be playing a part in her hypotensive symptoms/blood pressures, as she drinks lots of coffee through the day. She will continue to monitor this. She has follow up with Dr. Nehemiah Massed.  ?- Discussed appropriate home BP monitoring. She will switch to using an arm cuff and will bring with her to future appointments for accuracy comparison ? ?Osteoporosis: ?- Currently appropriately  managed ?- Reviewed recommendation for daily calcium intake of 1200 mg and vitamin D intake of [phone] units. She is having ~ 1 serving of dairy products daily, so will start taking calcium supplement ~ 400-600 mg daily at a different time. Can continue her current Vitamin D supplementation.  ?- Recommend to add weight bearing exercise  ? ?Follow Up Plan: follow up with PCP in ~ 6 weeks ? ?[name], PharmD, BCACP, CPP ?Clinical Pharmacist ?Therapist, music at [employer] ?[phone] ? ? ? ?

## 2021-09-11 NOTE — Patient Instructions (Signed)
Laura-Lee,  ? ?Keep up the great work! ? ?Increase Ozempic to 2 mg weekly. Please let Dr. Derrel Nip know if it decreases your appetite too much. Decrease Tresiba to 16 units daily. Continue Jardiance 25 mg daily.  ? ?Focus on adequate hydration. Start using the arm blood pressure cuff. Our goal blood pressure is less than 130/80. Make sure you are sitting still with your feet flat on the floor for at least 5 minutes before checking. Bring your cuff to your next office visit with [place] or Dr. Nehemiah Massed for comparison.  ? ?It has been a pleasure taking care of you! ? ?[name], PharmD ? ? ? ?

## 2021-09-20 ENCOUNTER — Other Ambulatory Visit: Payer: Self-pay | Admitting: Internal Medicine

## 2021-09-20 DIAGNOSIS — I10 Essential (primary) hypertension: Secondary | ICD-10-CM

## 2021-09-25 ENCOUNTER — Other Ambulatory Visit: Payer: Medicare Other | Admitting: Urology

## 2021-09-30 DIAGNOSIS — Z794 Long term (current) use of insulin: Secondary | ICD-10-CM | POA: Diagnosis not present

## 2021-09-30 DIAGNOSIS — E118 Type 2 diabetes mellitus with unspecified complications: Secondary | ICD-10-CM | POA: Diagnosis not present

## 2021-10-01 DIAGNOSIS — L723 Sebaceous cyst: Secondary | ICD-10-CM | POA: Diagnosis not present

## 2021-10-10 DIAGNOSIS — Z794 Long term (current) use of insulin: Secondary | ICD-10-CM | POA: Diagnosis not present

## 2021-10-10 DIAGNOSIS — B351 Tinea unguium: Secondary | ICD-10-CM | POA: Diagnosis not present

## 2021-10-10 DIAGNOSIS — E1140 Type 2 diabetes mellitus with diabetic neuropathy, unspecified: Secondary | ICD-10-CM | POA: Diagnosis not present

## 2021-10-16 ENCOUNTER — Other Ambulatory Visit: Payer: Medicare Other | Admitting: Urology

## 2021-10-16 ENCOUNTER — Ambulatory Visit (INDEPENDENT_AMBULATORY_CARE_PROVIDER_SITE_OTHER): Payer: Medicare Other

## 2021-10-16 ENCOUNTER — Encounter: Payer: Self-pay | Admitting: Internal Medicine

## 2021-10-16 ENCOUNTER — Ambulatory Visit (INDEPENDENT_AMBULATORY_CARE_PROVIDER_SITE_OTHER): Payer: Medicare Other | Admitting: Internal Medicine

## 2021-10-16 VITALS — BP 120/78 | HR 88 | Temp 98.5°F | Resp 14 | Ht 67.0 in | Wt 139.8 lb

## 2021-10-16 DIAGNOSIS — M25561 Pain in right knee: Secondary | ICD-10-CM

## 2021-10-16 DIAGNOSIS — M1711 Unilateral primary osteoarthritis, right knee: Secondary | ICD-10-CM | POA: Diagnosis not present

## 2021-10-16 DIAGNOSIS — M25461 Effusion, right knee: Secondary | ICD-10-CM | POA: Diagnosis not present

## 2021-10-16 MED ORDER — TRAMADOL HCL 50 MG PO TABS: 50.0000 mg | ORAL_TABLET | Freq: Two times a day (BID) | ORAL | 0 refills | Status: AC | PRN

## 2021-10-16 NOTE — Patient Instructions (Addendum)
[facility] ?Address: [address] [place], [place], [place] [address] ?Hours:  ?Open ? Closes 9?PM ?Phone: [phone] ? ? ?Arthritis ?Arthritis is a term that is commonly used to refer to joint pain or joint disease. There are more than 100 types of arthritis. ?What are the causes? ?The most common cause of this condition is wear and tear of a joint. Other causes include: ?Gout. ?Inflammation of a joint. ?An infection of a joint. ?Sprains and other injuries near the joint. ?A reaction to medicines or drugs, or an allergic reaction. ?In some cases, the cause may not be known. ?What are the signs or symptoms? ?The main symptom of this condition is pain in the joint during movement. Other symptoms include: ?Redness, swelling, or stiffness at a joint. ?Warmth coming from the joint. ?Fever. ?Overall feeling of illness. ?How is this diagnosed? ?This condition may be diagnosed with a physical exam and tests, including: ?Blood tests. ?Urine tests. ?Imaging tests, such as X-rays, an MRI, or a CT scan. ?Sometimes, fluid is removed from a joint for testing. ?How is this treated? ?This condition may be treated with: ?Treatment of the cause, if it is known. ?Rest. ?Raising (elevating) the joint. ?Applying cold or hot packs to the joint. ?Medicines to improve symptoms and reduce inflammation. ?Injections of a steroid such as cortisone into the joint to help reduce pain and inflammation. ?Depending on the cause of your arthritis, you may need to make lifestyle changes to reduce stress on your joint. Changes may include: ?Exercising more. ?Losing weight. ?Follow these instructions at home: ?Medicines ?Take over-the-counter and prescription medicines only as told by your health care provider. ?Do not take aspirin to relieve pain if your health care provider thinks that gout may be causing your pain. ?Activity ?Rest your joint if told by your health care provider. Rest is important when your disease is active and your joint feels  painful, swollen, or stiff. ?Avoid activities that make the pain worse. It is important to balance activity with rest. ?Exercise your joint regularly with range-of-motion exercises as told by your health care provider. Try doing low-impact exercise, such as: ?Swimming. ?Water aerobics. ?Biking. ?Walking. ?Managing pain, stiffness, and swelling ?  ?If directed, put ice on the joint. ?Put ice in a plastic bag. ?Place a towel between your skin and the bag. ?Leave the ice on for 20 minutes, 2-3 times per day. ?If your joint is swollen, raise (elevate) it above the level of your heart if directed by your health care provider. ?If your joint feels stiff in the morning, try taking a warm shower. ?If directed, apply heat to the affected area as often as told by your health care provider. Use the heat source that your health care provider recommends, such as a moist heat pack or a heating pad. If you have diabetes, do not apply heat without permission from your health care provider. To apply heat: ?Place a towel between your skin and the heat source. ?Leave the heat on for 20-30 minutes. ?Remove the heat if your skin turns bright red. This is especially important if you are unable to feel pain, heat, or cold. You may have a greater risk of getting burned. ?General instructions ?Do not use any products that contain nicotine or tobacco, such as cigarettes, e-cigarettes, and chewing tobacco. If you need help quitting, ask your health care provider. ?Keep all follow-up visits as told by your health care provider. This is important. ?Contact a health care provider if: ?The pain gets  worse. ?You have a fever. ?Get help right away if: ?You develop severe joint pain, swelling, or redness. ?Many joints become painful and swollen. ?You develop severe back pain. ?You develop severe weakness in your leg. ?You cannot control your bladder or bowels. ?Summary ?Arthritis is a term that is commonly used to refer to joint pain or joint  disease. There are more than 100 types of arthritis. ?The most common cause of this condition is wear and tear of a joint. Other causes include gout, inflammation or infection of the joint, sprains, or allergies. ?Symptoms of this condition include redness, swelling, or stiffness of the joint. Other symptoms include warmth, fever, or feeling ill. ?This condition is treated with rest, elevation, medicines, and applying cold or hot packs. ?Follow your health care provider's instructions about medicines, activity, exercises, and other home care treatments. ?This information is not intended to replace advice given to you by your health care provider. Make sure you discuss any questions you have with your health care provider. ?Document Revised: 05/31/2018 Document Reviewed: 05/31/2018 ?Elsevier Patient Education ? [place]. ? ?

## 2021-10-16 NOTE — Progress Notes (Signed)
Chief Complaint  ?Patient presents with  ? Acute Visit  ?  Pt c/o R knee pain onset Sunday denies any falls or injuries. Pt was sitting for about 2 hours watching a movie at home when she stood up and then sat back down all of a sudden that's when the pain started, intense pain yesterday but minor today. Pain mostly while putting wt on leg, swollen today.   ? ?F/u  ?1. 3 days knee pain worse age 61/15 knee trauma with aspiration of fluid and cast x 1 month ? Twisted last Sunday 5/10 Tuesday pain 15/10. Tried voltaren gel 3x yesterday and Tylenol with some relief  ? ?Xray moderate joint arthritis and effusion ? ?Review of Systems  ?Musculoskeletal:  Positive for joint pain.  ?Past Medical History:  ?Diagnosis Date  ? Absence of kidney   ? left  ? Anxiety   ? Arthritis   ? Bladder cancer ([place])   ? CHF (congestive heart failure) ([place])   ? Complication of anesthesia   ? BP HAS  RUN LOW AFTER SURGERY-LUNGS FILLED UP WITH FLUID AFTER  LEG STENT SURGERY   ? Coronary artery disease   ? Diabetes mellitus   ? Family history of adverse reaction to anesthesia   ? Sister - PONV  ? GERD (gastroesophageal reflux disease)   ? [place] TUMS  ? Heart murmur   ? Hemorrhoid   ? History of methicillin resistant staphylococcus aureus (MRSA) 2007  ? Hypertension   ? Neuropathy   ? PVD (peripheral vascular disease) ([place])   ? Thyroid nodule   ? right  ? Urothelial carcinoma of kidney ([place]) 10/31/2014  ? INVASIVE UROTHELIAL CARCINOMA, LOW GRADE. T1, Nx.  ? Wears dentures   ? full upper and lower  ? ?Past Surgical History:  ?Procedure Laterality Date  ? AMPUTATION TOE    ? right (4th and 5th); left (great toe, 3rd)  ? AMPUTATION TOE Right 07/16/2018  ? Procedure: AMPUTATION TOE/MPJ right 2nd;  Surgeon: Sharlotte Alamo, DPM;  Location: [facility];  Service: Podiatry;  Laterality: Right;  ? ARTERIAL BYPASS SURGRY  2009, 2013 x 2  ? right leg , done in [place]  ? CARDIAC CATHETERIZATION    ? CAROTID ENDARTERECTOMY Right 01/2014  ? Dr Delana Meyer  ? CATARACT  EXTRACTION W/PHACO Right 12/14/2014  ? Procedure: CATARACT EXTRACTION PHACO AND INTRAOCULAR LENS PLACEMENT (IOC);  Surgeon: Lyla Glassing, MD;  Location: [facility];  Service: Ophthalmology;  Laterality: Right;  [place]   00:38.6              AP        7.1                   CDE  2.76  ? CATARACT EXTRACTION W/PHACO Left 12/06/2019  ? Procedure: CATARACT EXTRACTION PHACO AND INTRAOCULAR LENS PLACEMENT ([place]) LEFT DIABETIC;  Surgeon: Birder Robson, MD;  Location: [facility];  Service: Ophthalmology;  Laterality: Left;  9.08 ?1:06.4  ? CESAREAN SECTION    ? CHOLECYSTECTOMY  03-03-12  ? Porcelain gallbladder, gallstones,  [place]  ? COLONOSCOPY W/ BIOPSIES  04/28/2012  ? Hyperplastic rectal polyps.  ? CORONARY ARTERY BYPASS GRAFT  2009  ? 3 vessel  ? CYSTOSCOPY W/ RETROGRADES Right 09/01/2016  ? Procedure: CYSTOSCOPY WITH RETROGRADE PYELOGRAM;  Surgeon: Hollice Espy, MD;  Location: [facility];  Service: Urology;  Laterality: Right;  ? CYSTOSCOPY W/ RETROGRADES Bilateral 03/19/2020  ? Procedure: CYSTOSCOPY WITH RETROGRADE PYELOGRAM;  Surgeon: Hollice Espy, MD;  Location: [facility];  Service: Urology;  Laterality: Bilateral;  ? CYSTOSCOPY WITH BIOPSY N/A 03/19/2020  ? Procedure: CYSTOSCOPY WITH BIOPSY;  Surgeon: Hollice Espy, MD;  Location: [facility];  Service: Urology;  Laterality: N/A;  ? EYE SURGERY    ? HERNIA REPAIR  10-31-14  ? ventral, retro-rectus atrium mesh  ? IRRIGATION AND DEBRIDEMENT FOOT Left 01/18/2019  ? Procedure: IRRIGATION AND DEBRIDEMENT FOOT;  Surgeon: Sharlotte Alamo, DPM;  Location: [facility];  Service: Podiatry;  Laterality: Left;  ? LOWER EXTREMITY ANGIOGRAPHY Left 12/10/2016  ? Procedure: Lower Extremity Angiography;  Surgeon: Katha Cabal, MD;  Location: [facility];  Service: Cardiovascular;  Laterality: Left;  ? LOWER EXTREMITY ANGIOGRAPHY Left 02/02/2018  ? Procedure: LOWER EXTREMITY ANGIOGRAPHY;  Surgeon: Katha Cabal, MD;  Location: [facility];  Service:  Cardiovascular;  Laterality: Left;  ? LOWER EXTREMITY ANGIOGRAPHY Left 05/05/2018  ? Procedure: LOWER EXTREMITY ANGIOGRAPHY;  Surgeon: Katha Cabal, MD;  Location: [facility];  Service: Cardiovascular;  Laterality: Left;  ? LOWER EXTREMITY ANGIOGRAPHY Left 12/04/2020  ? Procedure: LOWER EXTREMITY ANGIOGRAPHY with Intervention;  Surgeon: Katha Cabal, MD;  Location: [facility];  Service: Cardiovascular;  Laterality: Left;  ? NEPHRECTOMY Left 10-31-14  ? PERIPHERAL VASCULAR CATHETERIZATION Left 05/01/2015  ? Procedure: Lower Extremity Angiography;  Surgeon: Katha Cabal, MD;  Location: [facility];  Service: Cardiovascular;  Laterality: Left;  ? PERIPHERAL VASCULAR CATHETERIZATION  05/01/2015  ? Procedure: Lower Extremity Intervention;  Surgeon: Katha Cabal, MD;  Location: [facility];  Service: Cardiovascular;;  ? PERIPHERAL VASCULAR CATHETERIZATION Left 02/20/2015  ? Procedure: Pelvic Angiography;  Surgeon: Katha Cabal, MD;  Location: [facility];  Service: Cardiovascular;  Laterality: Left;  ? TRANSURETHRAL RESECTION OF BLADDER TUMOR WITH MITOMYCIN-C N/A 09/01/2016  ? Procedure: TRANSURETHRAL RESECTION OF BLADDER TUMOR WITH MITOMYCIN-C;  Surgeon: Hollice Espy, MD;  Location: [facility];  Service: Urology;  Laterality: N/A;  ? ?Family History  ?Problem Relation Age of Onset  ? Cancer Mother 67  ?     Lung Cancer  ? Cancer Father 45  ?     Lung Ca  ? Diabetes Son   ? Breast cancer Maternal Grandmother   ? Kidney cancer Neg Hx   ? Bladder Cancer Neg Hx   ? Prostate cancer Neg Hx   ? ?Social History  ? ?Socioeconomic History  ? Marital status: Legally Separated  ?  Spouse name: Not on file  ? Number of children: 2  ? Years of education: 65  ? Highest education level: 12th grade  ?Occupational History  ? Occupation: Disabled  ?Tobacco Use  ? Smoking status: Former  ?  Packs/day: 2.00  ?  Years: 35.00  ?  Pack years: 70.00  ?  Types: Cigarettes  ?   Quit date: 03/29/2013  ?  Years since quitting: 8.5  ? Smokeless tobacco: Never  ?Vaping Use  ? Vaping Use: Former  ?Substance and Sexual Activity  ? Alcohol use: Not Currently  ?  Alcohol/week: 0.0 standard drinks  ?  Comment: LAST DRINK 2009  ? Drug use: Not Currently  ?  Types: Cocaine  ?  Comment: last used in 2007  ? Sexual activity: Not Currently  ?Other Topics Concern  ? Not on file  ?Social History Narrative  ? Not on file  ? ?Social Determinants of Health  ? ?Financial Resource Strain: Low Risk   ? Difficulty  of Paying Living Expenses: Not hard at all  ?Food Insecurity: No Food Insecurity  ? Worried About Charity fundraiser in the Last Year: Never true  ? Ran Out of Food in the Last Year: Never true  ?Transportation Needs: No Transportation Needs  ? Lack of Transportation (Medical): No  ? Lack of Transportation (Non-Medical): No  ?Physical Activity: Inactive  ? Days of Exercise per Week: 0 days  ? Minutes of Exercise per Session: 0 min  ?Stress: No Stress Concern Present  ? Feeling of Stress : Only a little  ?Social Connections: Moderately Isolated  ? Frequency of Communication with Friends and Family: More than three times a week  ? Frequency of Social Gatherings with Friends and Family: More than three times a week  ? Attends Religious Services: 1 to 4 times per year  ? Active Member of Clubs or Organizations: No  ? Attends Archivist Meetings: Never  ? Marital Status: Separated  ?Intimate Partner Violence: Not At Risk  ? Fear of Current or Ex-Partner: No  ? Emotionally Abused: No  ? Physically Abused: No  ? Sexually Abused: No  ? ?Current Meds  ?Medication Sig  ? acetaminophen (TYLENOL) 500 MG tablet Take 1,000 mg by mouth every 6 (six) hours as needed for mild pain or headache.   ? ALPRAZolam (XANAX) 0.5 MG tablet TAKE 1 TABLET(0.5 MG) BY MOUTH DAILY AS NEEDED FOR ANXIETY  ? amLODipine (NORVASC) 2.5 MG tablet TAKE 1 TABLET(2.5 MG) BY MOUTH DAILY  ? aspirin 81 MG chewable tablet Chew 81 mg by  mouth at bedtime.   ? atorvastatin (LIPITOR) 20 MG tablet TAKE 1 TABLET(20 MG) BY MOUTH DAILY  ? BIOTIN PO Take 1 tablet by mouth daily.  ? Cholecalciferol (VITAMIN D3) 2000 UNITS TABS Take 2,000 Units by mouth daily a

## 2021-10-17 ENCOUNTER — Other Ambulatory Visit: Payer: Self-pay | Admitting: General Surgery

## 2021-10-17 DIAGNOSIS — L720 Epidermal cyst: Secondary | ICD-10-CM | POA: Diagnosis not present

## 2021-10-18 DIAGNOSIS — M25461 Effusion, right knee: Secondary | ICD-10-CM | POA: Diagnosis not present

## 2021-10-18 DIAGNOSIS — M25561 Pain in right knee: Secondary | ICD-10-CM | POA: Diagnosis not present

## 2021-10-18 LAB — SURGICAL PATHOLOGY

## 2021-10-31 ENCOUNTER — Ambulatory Visit (INDEPENDENT_AMBULATORY_CARE_PROVIDER_SITE_OTHER): Payer: Self-pay | Admitting: Internal Medicine

## 2021-10-31 DIAGNOSIS — E1169 Type 2 diabetes mellitus with other specified complication: Secondary | ICD-10-CM

## 2021-10-31 DIAGNOSIS — E118 Type 2 diabetes mellitus with unspecified complications: Secondary | ICD-10-CM | POA: Diagnosis not present

## 2021-10-31 DIAGNOSIS — I10 Essential (primary) hypertension: Secondary | ICD-10-CM

## 2021-10-31 DIAGNOSIS — Z794 Long term (current) use of insulin: Secondary | ICD-10-CM | POA: Diagnosis not present

## 2021-10-31 DIAGNOSIS — E1151 Type 2 diabetes mellitus with diabetic peripheral angiopathy without gangrene: Secondary | ICD-10-CM

## 2021-10-31 DIAGNOSIS — E785 Hyperlipidemia, unspecified: Secondary | ICD-10-CM

## 2021-11-02 NOTE — Assessment & Plan Note (Signed)
Patient failed to keep scheduled appointment and will be charged a no show fee.   

## 2021-11-02 NOTE — Progress Notes (Signed)
Patient failed to keep scheduled appointment and will be charged a no show fee.   

## 2021-11-05 NOTE — Progress Notes (Signed)
? ?  11/06/2021 ? ?CC:  ?Chief Complaint  ?Patient presents with  ? Cysto  ? ? ?HPI: ?Laura Reed is a 61 y.o.female with a personal history of pT1N0 left upper tract urothelial carcinoma, who presents today for a cystoscopy.  ?  ?She has a history of left upper tract transitional cell carcinoma/ bladder cancer recurrence. She is s/p left robotic nephroureterectomy and combined ventral hernia repair with Dr. Bary Castilla on 10/31/2014.  ? ?Recent Surveillance: ?02/2015 - cysto NED; 05/2015 cysto NED, 08/2015- cysto NED, 11/2015- NED, CT Urogram 11/12/15 negative, poor quality of delayed phase.; 5/2-17- cysto with mild erythema following recent UTI; 02/2016 NED; 08/2016 Lg Ta TCC recurrence,  1 cm, multifocal.  RTG neg.  ?08/2016- TURBT for bladder 1 cm recurrence near bladder neck and left UO, multifocal.  R RTG negative. Mitomycin.  LgTa. ?10/2016- Induction BCG x 6 ?08/2017- Maintenance BCG x 3 ?02/2019- Maintenance BCG x 3 ?9/21- Cysto with erythema on posterior bladder wall, bx c/w follicular cystitis, R RTG negative ?03/2021- Cysto with some vascular changes associated with previous biopsies consistent with scar ?  ?She also has a solitary kidney. S/p left nephrectomy as above. Most recent creatinine on 12/04/2020 was 0.99.  ?  ?She has an extensive smoking history.  ?  ?She is doing well today.  ? ?UA today unremarkable. ? ?Vitals:  ? 11/06/21 1441  ?BP: 130/70  ?Pulse: 73  ? ?NED. A&Ox3.   ?No respiratory distress   ?Abd soft, NT, ND ?Normal external genitalia with patent urethral meatus ? ?Cystoscopy Procedure Note ? ?Patient identification was confirmed, informed consent was obtained, and patient was prepped using Betadine solution.  Lidocaine jelly was administered per urethral meatus.   ? ?Procedure: ?- Flexible cystoscope introduced, without any difficulty.   ?- Thorough search of the bladder revealed: ?   normal urethral meatus ?   normal urothelium with some vascular changes associated with previous biopsies  consistent with scar ?   no stones ?   no ulcers  ?   no tumors ?   no urethral polyps ?   no trabeculation ? ?- Ureteral orifices were normal in position and appearance. ? ?Post-Procedure: ?- Patient tolerated the procedure well ? ?Assessment/ Plan: ?1.History of Malignant neoplasm of urinary bladder/ history of upper tract TCC ?- Last recurrence in 2018  ?- NED today  ?- Continue cystoscopy on a every 6 month interval.  ? ? ?Conley Rolls as a scribe for Hollice Espy, MD.,have documented all relevant documentation on the behalf of Hollice Espy, MD,as directed by  Hollice Espy, MD while in the presence of Hollice Espy, MD. ? ?I have reviewed the above documentation for accuracy and completeness, and I agree with the above.  ? ?Hollice Espy, MD ? ? ?

## 2021-11-06 ENCOUNTER — Ambulatory Visit (INDEPENDENT_AMBULATORY_CARE_PROVIDER_SITE_OTHER): Payer: Medicare Other | Admitting: Urology

## 2021-11-06 ENCOUNTER — Encounter: Payer: Self-pay | Admitting: Urology

## 2021-11-06 VITALS — BP 130/70 | HR 73 | Ht 67.0 in | Wt 139.0 lb

## 2021-11-06 DIAGNOSIS — Z8551 Personal history of malignant neoplasm of bladder: Secondary | ICD-10-CM

## 2021-11-06 LAB — URINALYSIS, COMPLETE
Bilirubin, UA: NEGATIVE
Ketones, UA: NEGATIVE
Leukocytes,UA: NEGATIVE
Nitrite, UA: NEGATIVE
Protein,UA: NEGATIVE
RBC, UA: NEGATIVE
Specific Gravity, UA: 1.01 (ref 1.005–1.030)
Urobilinogen, Ur: 0.2 mg/dL (ref 0.2–1.0)
pH, UA: 5.5 (ref 5.0–7.5)

## 2021-11-06 LAB — MICROSCOPIC EXAMINATION: Bacteria, UA: NONE SEEN

## 2021-11-08 DIAGNOSIS — E113512 Type 2 diabetes mellitus with proliferative diabetic retinopathy with macular edema, left eye: Secondary | ICD-10-CM | POA: Diagnosis not present

## 2021-11-19 ENCOUNTER — Encounter: Payer: Self-pay | Admitting: Internal Medicine

## 2021-11-19 ENCOUNTER — Ambulatory Visit (INDEPENDENT_AMBULATORY_CARE_PROVIDER_SITE_OTHER): Payer: Medicare Other | Admitting: Internal Medicine

## 2021-11-19 VITALS — BP 134/74 | HR 75 | Temp 98.4°F | Ht 67.0 in | Wt 137.4 lb

## 2021-11-19 DIAGNOSIS — I10 Essential (primary) hypertension: Secondary | ICD-10-CM

## 2021-11-19 DIAGNOSIS — Z1211 Encounter for screening for malignant neoplasm of colon: Secondary | ICD-10-CM

## 2021-11-19 DIAGNOSIS — Z794 Long term (current) use of insulin: Secondary | ICD-10-CM | POA: Diagnosis not present

## 2021-11-19 DIAGNOSIS — E1151 Type 2 diabetes mellitus with diabetic peripheral angiopathy without gangrene: Secondary | ICD-10-CM | POA: Diagnosis not present

## 2021-11-19 DIAGNOSIS — E1169 Type 2 diabetes mellitus with other specified complication: Secondary | ICD-10-CM | POA: Diagnosis not present

## 2021-11-19 DIAGNOSIS — E1165 Type 2 diabetes mellitus with hyperglycemia: Secondary | ICD-10-CM | POA: Diagnosis not present

## 2021-11-19 DIAGNOSIS — E1142 Type 2 diabetes mellitus with diabetic polyneuropathy: Secondary | ICD-10-CM | POA: Diagnosis not present

## 2021-11-19 DIAGNOSIS — R195 Other fecal abnormalities: Secondary | ICD-10-CM

## 2021-11-19 DIAGNOSIS — E785 Hyperlipidemia, unspecified: Secondary | ICD-10-CM | POA: Diagnosis not present

## 2021-11-19 LAB — POCT GLYCOSYLATED HEMOGLOBIN (HGB A1C): Hemoglobin A1C: 7.7 % — AB (ref 4.0–5.6)

## 2021-11-19 MED ORDER — EMPAGLIFLOZIN 25 MG PO TABS: ORAL_TABLET | ORAL | 2 refills | Status: DC

## 2021-11-19 MED ORDER — INSULIN LISPRO (1 UNIT DIAL) 100 UNIT/ML (KWIKPEN): PEN_INJECTOR | SUBCUTANEOUS | 0 refills | Status: DC

## 2021-11-19 NOTE — Patient Instructions (Addendum)
Continue 20 units of Tresiba for now ? ?We will add mealtime insulin once I review your mealtime blood sugars  ? ?For the next week :  ? ?Please check your blood sugar right  before , and 2 hours after ,                each meal.    ( 6 readings  daily )  ?

## 2021-11-19 NOTE — Assessment & Plan Note (Signed)
Well controlled on current regimen based on home readings  Renal function stable, no changes today. ? ?Lab Results  ?Component Value Date  ? CREATININE 0.99 08/02/2021  ? ?Lab Results  ?Component Value Date  ? NA 136 08/02/2021  ? K 4.7 08/02/2021  ? CL 100 08/02/2021  ? CO2 23 08/02/2021  ? ? ?

## 2021-11-19 NOTE — Assessment & Plan Note (Signed)
Stopping oempic due to lapses in availability.  Continue Tyler Aas.  Adding humalog tid once BS can be reviewed.  ?

## 2021-11-19 NOTE — Progress Notes (Signed)
? ?Subjective:  ?Patient ID: Laura Reed, female    DOB: [birth year]  Age: 61 y.o. MRN: [id number] ? ?CC: The primary encounter diagnosis was Controlled type 2 DM with peripheral circulatory disorder ([place]). Diagnoses of Primary hypertension, Hyperlipidemia associated with type 2 diabetes mellitus ([place]), Type 2 diabetes mellitus with hyperglycemia, with long-term current use of insulin ([place]), Colon cancer screening, and Diabetic peripheral neuropathy associated with type 2 diabetes mellitus ([place]) were also pertinent to this visit. ? ? ?HPI ?Laura Reed presents for  ?Chief Complaint  ?Patient presents with  ? Follow-up  ?  3 month follow up   ? ?1) T2DM:  She  feels generally well,  is walking ONCE A WEEK AT THE [place]ALL. DENIES CLAUDICATION AND DYSPNEA.  She is not trying to lose weight but is losing weight unintentionally . Checking  blood sugars multiple times daily using the CBG monitor but no data available today for download.  BS average of 169 , she thinks    the highest seen was 150.  She denies any recent hypoglyemic events.  Taking   medications as directed. Admits to overindulging in carbs at her evening dinner (french fries dipped in [place] vanilla treat or  ice cream) .  But denies numbness, burning and tingling of extremities. Appetite is good.  Was tolerating ozempic but stopped it due to lack of access /supply .  Last use of ozempic was 2-3 weeks ago 1 mg dose .  Has increase tresiba dose to 20 units.  Wants to resume mealtime . ? ?2) HTN:  Hypertension: patient checks blood pressure daily  at home.  Readings have been for the most part < 140/80 at rest . Patient is following a reduced salt diet most days and is taking medications as prescribed.  ? ?Outpatient Medications Prior to Visit  ?Medication Sig Dispense Refill  ? acetaminophen (TYLENOL) 500 MG tablet Take 1,000 mg by mouth every 6 (six) hours as needed for mild pain or headache.     ? ALPRAZolam (XANAX) 0.5 MG tablet TAKE 1 TABLET(0.5 MG)  BY MOUTH DAILY AS NEEDED FOR ANXIETY 30 tablet 5  ? amLODipine (NORVASC) 2.5 MG tablet TAKE 1 TABLET(2.5 MG) BY MOUTH DAILY 90 tablet 1  ? aspirin 81 MG chewable tablet Chew 81 mg by mouth at bedtime.     ? atorvastatin (LIPITOR) 20 MG tablet TAKE 1 TABLET(20 MG) BY MOUTH DAILY 90 tablet 1  ? BIOTIN PO Take 1 tablet by mouth daily.    ? Cholecalciferol (VITAMIN D3) 2000 UNITS TABS Take 2,000 Units by mouth daily after supper.     ? clopidogrel (PLAVIX) 75 MG tablet TAKE 1 TABLET(75 MG) BY MOUTH DAILY 90 tablet 1  ? Continuous Blood Gluc Sensor (FREESTYLE LIBRE 2 SENSOR) MISC Use to check sugar at least 4 times daily 2 each 2  ? gabapentin (NEURONTIN) 800 MG tablet TAKE 1 TABLET(800 MG) BY MOUTH THREE TIMES DAILY 270 tablet 3  ? insulin degludec (TRESIBA FLEXTOUCH) 100 UNIT/ML FlexTouch Pen Inject 16 Units into the skin daily. 30 mL 2  ? Insulin Pen Needle (PEN NEEDLES) 32G X 4 MM MISC Use to take insulin daily 100 each 3  ? losartan (COZAAR) 50 MG tablet TAKE 1 TABLET(50 MG) BY MOUTH DAILY 90 tablet 3  ? Multiple Vitamin (MULTIVITAMIN) tablet Take 1 tablet by mouth daily.    ? multivitamin-lutein (OCUVITE-LUTEIN) CAPS capsule Take 1 capsule by mouth daily.    ? Semaglutide, 2 MG/DOSE, 8 MG/3ML  SOPN Inject 2 mg as directed once a week. 3 mL 5  ? JARDIANCE 25 MG TABS tablet TAKE 1 TABLET(25 MG) BY MOUTH DAILY BEFORE AND BREAKFAST 30 tablet 2  ? ?No facility-administered medications prior to visit.  ? ? ?Review of Systems; ? ?Patient denies headache, fevers, malaise, unintentional weight loss, skin rash, eye pain, sinus congestion and sinus pain, sore throat, dysphagia,  hemoptysis , cough, dyspnea, wheezing, chest pain, palpitations, orthopnea, edema, abdominal pain, nausea, melena, diarrhea, constipation, flank pain, dysuria, hematuria, urinary  Frequency, nocturia, numbness, tingling, seizures,  Focal weakness, Loss of consciousness,  Tremor, insomnia, depression, anxiety, and suicidal ideation.   ? ? ? ?Objective:   ?BP 134/74 (BP Location: Left Arm, Patient Position: Sitting, Cuff Size: Normal)   Pulse 75   Temp 98.4 ?F (36.9 ?C) (Oral)   Ht '5\' 7"'$  (1.702 m)   Wt 137 lb 6.4 oz (62.3 kg)   SpO2 94%   BMI 21.52 kg/m?  ? ?BP Readings from Last 3 Encounters:  ?11/19/21 134/74  ?11/06/21 130/70  ?10/16/21 120/78  ? ? ?Wt Readings from Last 3 Encounters:  ?11/19/21 137 lb 6.4 oz (62.3 kg)  ?11/06/21 139 lb (63 kg)  ?10/16/21 139 lb 12.8 oz (63.4 kg)  ? ? ?General appearance: alert, cooperative and appears stated age ?Ears: normal TM's and external ear canals both ears ?Throat: lips, mucosa, and tongue normal; teeth and gums normal ?Neck: no adenopathy, no carotid bruit, supple, symmetrical, trachea midline and thyroid not enlarged, symmetric, no tenderness/mass/nodules ?Back: symmetric, no curvature. ROM normal. No CVA tenderness. ?Lungs: clear to auscultation bilaterally ?Heart: regular rate and rhythm, S1, S2 normal, no murmur, click, rub or gallop ?Abdomen: soft, non-tender; bowel sounds normal; no masses,  no organomegaly ?Pulses: 2+ and symmetric ?Skin: Skin color, texture, turgor normal. No rashes or lesions ?Lymph nodes: Cervical, supraclavicular, and axillary nodes normal. ? ?Lab Results  ?Component Value Date  ? HGBA1C 7.7 (A) 11/19/2021  ? HGBA1C 7.5 (H) 08/02/2021  ? HGBA1C 7.3 (H) 03/28/2021  ? ? ?Lab Results  ?Component Value Date  ? CREATININE 0.99 08/02/2021  ? CREATININE 0.95 03/28/2021  ? CREATININE 0.99 12/04/2020  ? ? ?Lab Results  ?Component Value Date  ? WBC 9.2 03/15/2020  ? HGB 13.6 03/15/2020  ? HCT 37.4 03/15/2020  ? PLT 274 03/15/2020  ? GLUCOSE 178 (H) 08/02/2021  ? CHOL 106 08/02/2021  ? TRIG 107 08/02/2021  ? HDL 49 08/02/2021  ? LDLDIRECT 47.0 01/28/2017  ? [place] 37 08/02/2021  ? ALT 21 08/02/2021  ? AST 19 08/02/2021  ? NA 136 08/02/2021  ? K 4.7 08/02/2021  ? CL 100 08/02/2021  ? CREATININE 0.99 08/02/2021  ? BUN 25 08/02/2021  ? CO2 23 08/02/2021  ? TSH 0.946 08/02/2021  ? INR 0.9 10/17/2014   ? HGBA1C 7.7 (A) 11/19/2021  ? MICROALBUR 2.0 (H) 11/15/2020  ? ? ?DEXAScan ? ?Result Date: 09/04/2021 ?EXAM: DUAL X-RAY ABSORPTIOMETRY (DXA) FOR BONE MINERAL DENSITY IMPRESSION: Dear Dr. Derrel Nip, Your patient SUNDEE GARLAND completed a FRAX assessment on 09/04/2021 using the [place] (analysis version: 14.10) manufactured by [employer]. The following summarizes the results of our evaluation. PATIENT BIOGRAPHICAL: Name: Gudrun, Axe Patient ID: [id number] Birth Date: 05/03/1961 Height:    67.0 in. Gender:     Female    [age]       Weight:    137.4 lbs. Ethnicity:  White  Exam Date: 09/04/2021 FRAX* RESULTS:  (version: 3.5) 10-year Probability of Fracture1 Major Osteoporotic Fracture2 Hip Fracture 7.6% 0.7% Population: [place] (Caucasian) Risk Factors: None Based on Femur (Right) Neck BMD 1 -The 10-year probability of fracture may be lower than reported if the patient has received treatment. 2 -Major Osteoporotic Fracture: Clinical Spine, Forearm, Hip or Shoulder *FRAX is a Materials engineer of the [employer] for Metabolic Bone Disease, a [place] (WHO) [facility]. ASSESSMENT: The probability of a major osteoporotic fracture is 7.6% within the next ten years. The probability of a hip fracture is 0.7% within the next ten years. . Your patient Pansie Guggisberg completed a BMD test on 09/04/2021 using the [place] (software version: 14.10) manufactured by [employer]. The following summarizes the results of our evaluation. Technologist: [facility] PATIENT BIOGRAPHICAL: Name: Jlynn, Ly Patient ID: [id number] Birth Date: 1961/04/27 Height: 67.0 in. Gender: Female Exam Date: 09/04/2021 Weight: 137.4 lbs. Indications: Caucasian, Diabetic, Hx of Tobacco Use, Postmenopausal Fractures: Treatments: Calcium, Jardiance, Vitamin D DENSITOMETRY RESULTS: Site         Region     Measured Date Measured Age  WHO Classification Young Adult T-score BMD         %Change vs. Previous Significant Change (*) AP Spine L1-L4 (L2) 09/04/2021 62.9 Normal 1.3 1.353 g/cm2 6.1% Yes AP Spine L1-L4 (L2) 12[birth year]05/2008 50.2 Normal 0.7

## 2021-11-19 NOTE — Assessment & Plan Note (Signed)
DME FOR DIABETIC SHOES  ?

## 2021-11-20 LAB — COMPREHENSIVE METABOLIC PANEL
ALT: 21 U/L (ref 0–35)
AST: 18 U/L (ref 0–37)
Albumin: 4 g/dL (ref 3.5–5.2)
Alkaline Phosphatase: 83 U/L (ref 39–117)
BUN: 26 mg/dL — ABNORMAL HIGH (ref 6–23)
CO2: 26 mEq/L (ref 19–32)
Calcium: 9.1 mg/dL (ref 8.4–10.5)
Chloride: 99 mEq/L (ref 96–112)
Creatinine, Ser: 1.06 mg/dL (ref 0.40–1.20)
GFR: 56.03 mL/min — ABNORMAL LOW (ref >60.00)
Glucose, Bld: 213 mg/dL — ABNORMAL HIGH (ref 70–99)
Potassium: 4.2 mEq/L (ref 3.5–5.1)
Sodium: 133 mEq/L — ABNORMAL LOW (ref 135–145)
Total Bilirubin: 0.5 mg/dL (ref 0.2–1.2)
Total Protein: 6.8 g/dL (ref 6.0–8.3)

## 2021-11-20 LAB — LIPID PANEL
Cholesterol: 102 mg/dL (ref 0–200)
HDL: 45 mg/dL (ref >39.00)
LDL Cholesterol: 34 mg/dL (ref 0–99)
NonHDL: 56.7
Total CHOL/HDL Ratio: 2
Triglycerides: 115 mg/dL (ref 0.0–149.0)
VLDL: 23 mg/dL (ref 0.0–40.0)

## 2021-11-20 LAB — MICROALBUMIN / CREATININE URINE RATIO
Creatinine,U: 18.2 mg/dL
Microalb Creat Ratio: 11.6 mg/g (ref 0.0–30.0)
Microalb, Ur: 2.1 mg/dL — ABNORMAL HIGH (ref 0.0–1.9)

## 2021-11-20 LAB — LDL CHOLESTEROL, DIRECT: Direct LDL: 43 mg/dL

## 2021-11-21 DIAGNOSIS — I25708 Atherosclerosis of coronary artery bypass graft(s), unspecified, with other forms of angina pectoris: Secondary | ICD-10-CM | POA: Diagnosis not present

## 2021-11-21 DIAGNOSIS — I70211 Atherosclerosis of native arteries of extremities with intermittent claudication, right leg: Secondary | ICD-10-CM | POA: Diagnosis not present

## 2021-11-21 DIAGNOSIS — I739 Peripheral vascular disease, unspecified: Secondary | ICD-10-CM | POA: Diagnosis not present

## 2021-11-21 DIAGNOSIS — E782 Mixed hyperlipidemia: Secondary | ICD-10-CM | POA: Diagnosis not present

## 2021-11-21 DIAGNOSIS — I10 Essential (primary) hypertension: Secondary | ICD-10-CM | POA: Diagnosis not present

## 2021-12-01 DIAGNOSIS — E118 Type 2 diabetes mellitus with unspecified complications: Secondary | ICD-10-CM | POA: Diagnosis not present

## 2021-12-01 DIAGNOSIS — Z794 Long term (current) use of insulin: Secondary | ICD-10-CM | POA: Diagnosis not present

## 2021-12-02 DIAGNOSIS — Z1211 Encounter for screening for malignant neoplasm of colon: Secondary | ICD-10-CM | POA: Diagnosis not present

## 2021-12-05 ENCOUNTER — Encounter (INDEPENDENT_AMBULATORY_CARE_PROVIDER_SITE_OTHER): Payer: Medicare Other

## 2021-12-09 ENCOUNTER — Encounter (INDEPENDENT_AMBULATORY_CARE_PROVIDER_SITE_OTHER): Payer: Medicare Other

## 2021-12-09 LAB — COLOGUARD: COLOGUARD: POSITIVE — AB

## 2021-12-10 DIAGNOSIS — R195 Other fecal abnormalities: Secondary | ICD-10-CM | POA: Insufficient documentation

## 2021-12-10 NOTE — Addendum Note (Signed)
Addended by: [name] on: 12/10/2021 07:34 PM   Modules accepted: Orders

## 2021-12-10 NOTE — Assessment & Plan Note (Signed)
Referring to Dr Bary Castilla for colonoscopy

## 2021-12-11 ENCOUNTER — Other Ambulatory Visit (INDEPENDENT_AMBULATORY_CARE_PROVIDER_SITE_OTHER): Payer: Self-pay | Admitting: Vascular Surgery

## 2021-12-11 DIAGNOSIS — I739 Peripheral vascular disease, unspecified: Secondary | ICD-10-CM

## 2021-12-11 DIAGNOSIS — I6523 Occlusion and stenosis of bilateral carotid arteries: Secondary | ICD-10-CM

## 2021-12-11 NOTE — Progress Notes (Deleted)
MRN : [id number]  Laura Reed is a 61 y.o. (April 03, 1961) female who presents with chief complaint of check circulation.  History of Present Illness:  The patient returns to the office for followup and review status post angiogram with intervention on 12/04/2020.    Procedure: Percutaneous transluminal angioplasty and stent placement left popliteal arteries to 6 mm 2.   Percutaneous transluminal angioplasty left anterior tibial to 2.5 mm 3.   Mechanical thrombectomy using the [place] catheter left popliteal artery. 4.   Mechanical thrombectomy using the penumbra CAT 6 device left anterior tibial artery.   The patient notes improvement in the lower extremity symptoms. No interval shortening of the patient's claudication distance or rest pain symptoms. Previous wounds have now healed.  No new ulcers or wounds have occurred since the last visit.   There have been no significant changes to the patient's overall health care.   The patient denies amaurosis fugax or recent TIA symptoms. There are no recent neurological changes noted. The patient denies history of DVT, PE or superficial thrombophlebitis. The patient denies recent episodes of angina or shortness of breath.    ABI's Rt=0.98 and Lt=0.95 (previous ABI's Rt=1.02 and Lt=1.06) Duplex US of the left lower extremity arterial system shows patent bypass and stent     No outpatient medications have been marked as taking for the 12/12/21 encounter (Appointment) with Delana Meyer, Dolores Lory, MD.    Past Medical History:  Diagnosis Date   Absence of kidney    left   Anxiety    Arthritis    Bladder cancer ([place])    CHF (congestive heart failure) (HCC)    Complication of anesthesia    BP HAS  RUN LOW AFTER SURGERY-LUNGS FILLED UP WITH FLUID AFTER  LEG STENT SURGERY    Coronary artery disease    Diabetes mellitus    Family history of adverse reaction to anesthesia    Sister - PONV   GERD (gastroesophageal reflux disease)    OCC  TUMS   Heart murmur    Hemorrhoid    History of methicillin resistant staphylococcus aureus (MRSA) 2007   Hypertension    Neuropathy    PVD (peripheral vascular disease) (HCC)    Thyroid nodule    right   Urothelial carcinoma of kidney ([place]) 10/31/2014   INVASIVE UROTHELIAL CARCINOMA, LOW GRADE. T1, Nx.   Wears dentures    full upper and lower    Past Surgical History:  Procedure Laterality Date   AMPUTATION TOE     right (4th and 5th); left (great toe, 3rd)   AMPUTATION TOE Right 07/16/2018   Procedure: AMPUTATION TOE/MPJ right 2nd;  Surgeon: Sharlotte Alamo, DPM;  Location: [facility];  Service: Podiatry;  Laterality: Right;   ARTERIAL BYPASS SURGRY  2009, 2013 x 2   right leg , done in [place] Right 01/2014   Dr Delana Meyer   CATARACT EXTRACTION W/PHACO Right 12/14/2014   Procedure: CATARACT EXTRACTION PHACO AND INTRAOCULAR LENS PLACEMENT ([place]);  Surgeon: Lyla Glassing, MD;  Location: [facility];  Service: Ophthalmology;  Laterality: Right;  [place]   00:38.6              AP        7.1                   CDE  2.76   CATARACT EXTRACTION W/PHACO Left 12/06/2019   Procedure: CATARACT EXTRACTION  PHACO AND INTRAOCULAR LENS PLACEMENT (IOC) LEFT DIABETIC;  Surgeon: Birder Robson, MD;  Location: [place];  Service: Ophthalmology;  Laterality: Left;  9.08 1:06.4   CESAREAN SECTION     CHOLECYSTECTOMY  03-03-12   Porcelain gallbladder, gallstones,  [place]   COLONOSCOPY W/ BIOPSIES  04/28/2012   Hyperplastic rectal polyps.   CORONARY ARTERY BYPASS GRAFT  2009   3 vessel   CYSTOSCOPY W/ RETROGRADES Right 09/01/2016   Procedure: CYSTOSCOPY WITH RETROGRADE PYELOGRAM;  Surgeon: Hollice Espy, MD;  Location: [facility];  Service: Urology;  Laterality: Right;   CYSTOSCOPY W/ RETROGRADES Bilateral 03/19/2020   Procedure: CYSTOSCOPY WITH RETROGRADE PYELOGRAM;  Surgeon: Hollice Espy, MD;  Location: [facility];  Service: Urology;  Laterality:  Bilateral;   CYSTOSCOPY WITH BIOPSY N/A 03/19/2020   Procedure: CYSTOSCOPY WITH BIOPSY;  Surgeon: Hollice Espy, MD;  Location: [facility];  Service: Urology;  Laterality: N/A;   EYE SURGERY     HERNIA REPAIR  10-31-14   ventral, retro-rectus atrium mesh   IRRIGATION AND DEBRIDEMENT FOOT Left 01/18/2019   Procedure: IRRIGATION AND DEBRIDEMENT FOOT;  Surgeon: Sharlotte Alamo, DPM;  Location: [facility];  Service: Podiatry;  Laterality: Left;   LOWER EXTREMITY ANGIOGRAPHY Left 12/10/2016   Procedure: Lower Extremity Angiography;  Surgeon: Katha Cabal, MD;  Location: [place] [facility];  Service: Cardiovascular;  Laterality: Left;   LOWER EXTREMITY ANGIOGRAPHY Left 02/02/2018   Procedure: LOWER EXTREMITY ANGIOGRAPHY;  Surgeon: Katha Cabal, MD;  Location: [place] [facility];  Service: Cardiovascular;  Laterality: Left;   LOWER EXTREMITY ANGIOGRAPHY Left 05/05/2018   Procedure: LOWER EXTREMITY ANGIOGRAPHY;  Surgeon: Katha Cabal, MD;  Location: [place] [facility];  Service: Cardiovascular;  Laterality: Left;   LOWER EXTREMITY ANGIOGRAPHY Left 12/04/2020   Procedure: LOWER EXTREMITY ANGIOGRAPHY with Intervention;  Surgeon: Katha Cabal, MD;  Location: [place] [facility];  Service: Cardiovascular;  Laterality: Left;   NEPHRECTOMY Left 10-31-14   PERIPHERAL VASCULAR CATHETERIZATION Left 05/01/2015   Procedure: Lower Extremity Angiography;  Surgeon: Katha Cabal, MD;  Location: [place] [facility];  Service: Cardiovascular;  Laterality: Left;   PERIPHERAL VASCULAR CATHETERIZATION  05/01/2015   Procedure: Lower Extremity Intervention;  Surgeon: Katha Cabal, MD;  Location: [place] [facility];  Service: Cardiovascular;;   PERIPHERAL VASCULAR CATHETERIZATION Left 02/20/2015   Procedure: Pelvic Angiography;  Surgeon: Katha Cabal, MD;  Location: [place] [facility];  Service: Cardiovascular;  Laterality: Left;   TRANSURETHRAL RESECTION OF BLADDER TUMOR WITH MITOMYCIN-C  N/A 09/01/2016   Procedure: TRANSURETHRAL RESECTION OF BLADDER TUMOR WITH MITOMYCIN-C;  Surgeon: Hollice Espy, MD;  Location: [facility];  Service: Urology;  Laterality: N/A;    Social History Social History   Tobacco Use   Smoking status: Former    Packs/day: 2.00    Years: 35.00    Pack years: 70.00    Types: Cigarettes    Quit date: 03/29/2013    Years since quitting: 8.7   Smokeless tobacco: Never  Vaping Use   Vaping Use: Former  Substance Use Topics   Alcohol use: Not Currently    Alcohol/week: 0.0 standard drinks    Comment: LAST DRINK 2009   Drug use: Not Currently    Types: Cocaine    Comment: last used in 2007    Family History Family History  Problem Relation Age of Onset   Cancer Mother 43       Lung Cancer   Cancer Father 7  Lung Ca   Diabetes Son    Breast cancer Maternal Grandmother    Kidney cancer Neg Hx    Bladder Cancer Neg Hx    Prostate cancer Neg Hx     No Known Allergies   REVIEW OF SYSTEMS (Negative unless checked)  Constitutional: '[]'$ Weight loss  '[]'$ Fever  '[]'$ Chills Cardiac: '[]'$ Chest pain   '[]'$ Chest pressure   '[]'$ Palpitations   '[]'$ Shortness of breath when laying flat   '[]'$ Shortness of breath with exertion. Vascular:  '[x]'$ Pain in legs with walking   '[]'$ Pain in legs at rest  '[]'$ History of DVT   '[]'$ Phlebitis   '[]'$ Swelling in legs   '[]'$ Varicose veins   '[]'$ Non-healing ulcers Pulmonary:   '[]'$ Uses home oxygen   '[]'$ Productive cough   '[]'$ Hemoptysis   '[]'$ Wheeze  '[]'$ COPD   '[]'$ Asthma Neurologic:  '[]'$ Dizziness   '[]'$ Seizures   '[]'$ History of stroke   '[]'$ History of TIA  '[]'$ Aphasia   '[]'$ Vissual changes   '[]'$ Weakness or numbness in arm   '[]'$ Weakness or numbness in leg Musculoskeletal:   '[]'$ Joint swelling   '[]'$ Joint pain   '[]'$ Low back pain Hematologic:  '[]'$ Easy bruising  '[]'$ Easy bleeding   '[]'$ Hypercoagulable state   '[]'$ Anemic Gastrointestinal:  '[]'$ Diarrhea   '[]'$ Vomiting  '[]'$ Gastroesophageal reflux/heartburn   '[]'$ Difficulty swallowing. Genitourinary:  '[]'$ Chronic kidney disease   '[]'$ Difficult  urination  '[]'$ Frequent urination   '[]'$ Blood in urine Skin:  '[]'$ Rashes   '[]'$ Ulcers  Psychological:  '[]'$ History of anxiety   '[]'$  History of major depression.  Physical Examination  There were no vitals filed for this visit. There is no height or weight on file to calculate BMI. Gen: WD/WN, NAD Head: /AT, No temporalis wasting.  Ear/Nose/Throat: Hearing grossly intact, nares w/o erythema or drainage Eyes: PER, EOMI, sclera nonicteric.  Neck: Supple, no masses.  No bruit or JVD.  Pulmonary:  Good air movement, no audible wheezing, no use of accessory muscles.  Cardiac: RRR, normal S1, S2, no Murmurs. Vascular:  mild trophic changes, no open wounds Vessel Right Left  Radial Palpable Palpable  PT Not Palpable Not Palpable  DP Not Palpable Not Palpable  Gastrointestinal: soft, non-distended. No guarding/no peritoneal signs.  Musculoskeletal: M/S 5/5 throughout.  No visible deformity.  Neurologic: CN 2-12 intact. Pain and light touch intact in extremities.  Symmetrical.  Speech is fluent. Motor exam as listed above. Psychiatric: Judgment intact, Mood & affect appropriate for pt's clinical situation. Dermatologic: No rashes or ulcers noted.  No changes consistent with cellulitis.   CBC Lab Results  Component Value Date   WBC 9.2 03/15/2020   HGB 13.6 03/15/2020   HCT 37.4 03/15/2020   MCV 81.3 03/15/2020   PLT 274 03/15/2020    BMET    Component Value Date/Time   NA 133 (L) 11/19/2021 1549   NA 136 08/02/2021 1629   NA 135 11/02/2014 0609   K 4.2 11/19/2021 1549   K 4.2 11/02/2014 0609   CL 99 11/19/2021 1549   CL 107 11/02/2014 0609   CO2 26 11/19/2021 1549   CO2 23 11/02/2014 0609   GLUCOSE 213 (H) 11/19/2021 1549   GLUCOSE 108 (H) 11/02/2014 0609   BUN 26 (H) 11/19/2021 1549   BUN 25 08/02/2021 1629   BUN 20 11/02/2014 0609   CREATININE 1.06 11/19/2021 1549   CREATININE 1.01 11/09/2015 1549   CREATININE 1.01 11/09/2015 1549   CALCIUM 9.1 11/19/2021 1549   CALCIUM 7.3  (L) 11/02/2014 0609   GFRNONAA >60 12/04/2020 0921   GFRNONAA 50 (L) 11/02/2014 0609   GFRAA >60  01/19/2019 0357   GFRAA 58 (L) 11/02/2014 0609   CrCl cannot be calculated (Patient's most recent lab result is older than the maximum 21 days allowed.).  COAG Lab Results  Component Value Date   INR 0.9 10/17/2014   INR 1.1 01/19/2014   INR 0.9 12/06/2013    Radiology No results found.   Assessment/Plan There are no diagnoses linked to this encounter.   Hortencia Pilar, MD  12/11/2021 9:16 PM

## 2021-12-12 ENCOUNTER — Ambulatory Visit (INDEPENDENT_AMBULATORY_CARE_PROVIDER_SITE_OTHER): Payer: Medicare Other | Admitting: Vascular Surgery

## 2021-12-12 ENCOUNTER — Encounter (INDEPENDENT_AMBULATORY_CARE_PROVIDER_SITE_OTHER): Payer: Medicare Other

## 2021-12-12 DIAGNOSIS — I10 Essential (primary) hypertension: Secondary | ICD-10-CM

## 2021-12-12 DIAGNOSIS — I6523 Occlusion and stenosis of bilateral carotid arteries: Secondary | ICD-10-CM

## 2021-12-12 DIAGNOSIS — I25118 Atherosclerotic heart disease of native coronary artery with other forms of angina pectoris: Secondary | ICD-10-CM

## 2021-12-12 DIAGNOSIS — E1151 Type 2 diabetes mellitus with diabetic peripheral angiopathy without gangrene: Secondary | ICD-10-CM

## 2021-12-12 DIAGNOSIS — I70213 Atherosclerosis of native arteries of extremities with intermittent claudication, bilateral legs: Secondary | ICD-10-CM

## 2021-12-19 DIAGNOSIS — I25708 Atherosclerosis of coronary artery bypass graft(s), unspecified, with other forms of angina pectoris: Secondary | ICD-10-CM | POA: Diagnosis not present

## 2021-12-25 DIAGNOSIS — I25708 Atherosclerosis of coronary artery bypass graft(s), unspecified, with other forms of angina pectoris: Secondary | ICD-10-CM | POA: Diagnosis not present

## 2021-12-25 DIAGNOSIS — I70211 Atherosclerosis of native arteries of extremities with intermittent claudication, right leg: Secondary | ICD-10-CM | POA: Diagnosis not present

## 2021-12-28 ENCOUNTER — Other Ambulatory Visit: Payer: Self-pay | Admitting: Internal Medicine

## 2021-12-31 DIAGNOSIS — R195 Other fecal abnormalities: Secondary | ICD-10-CM | POA: Diagnosis not present

## 2022-01-01 ENCOUNTER — Other Ambulatory Visit: Payer: Self-pay | Admitting: Internal Medicine

## 2022-01-01 ENCOUNTER — Other Ambulatory Visit: Payer: Self-pay | Admitting: General Surgery

## 2022-01-01 DIAGNOSIS — E1169 Type 2 diabetes mellitus with other specified complication: Secondary | ICD-10-CM | POA: Diagnosis not present

## 2022-01-17 ENCOUNTER — Telehealth: Payer: Self-pay | Admitting: Internal Medicine

## 2022-01-17 DIAGNOSIS — I10 Essential (primary) hypertension: Secondary | ICD-10-CM

## 2022-01-17 MED ORDER — ATORVASTATIN CALCIUM 20 MG PO TABS: ORAL_TABLET | ORAL | 3 refills | Status: DC

## 2022-01-17 MED ORDER — CLOPIDOGREL BISULFATE 75 MG PO TABS: ORAL_TABLET | ORAL | 1 refills | Status: DC

## 2022-01-17 MED ORDER — LOSARTAN POTASSIUM 50 MG PO TABS: ORAL_TABLET | ORAL | 1 refills | Status: DC

## 2022-01-17 NOTE — Telephone Encounter (Signed)
LM letting pt know that medications have been refilled.

## 2022-01-17 NOTE — Telephone Encounter (Signed)
Patient has been trying to get the following refills filled at her pharmacy. She is requesting the following refills; atorvastatin (LIPITOR) 20 MG tablet, clopidogrel (PLAVIX) 75 MG tablet, losartan (COZAAR) 50 MG tablet. Patient is requesting if medication can be filled today, she will be out this evening.

## 2022-01-23 DIAGNOSIS — B351 Tinea unguium: Secondary | ICD-10-CM | POA: Diagnosis not present

## 2022-01-23 DIAGNOSIS — E1140 Type 2 diabetes mellitus with diabetic neuropathy, unspecified: Secondary | ICD-10-CM | POA: Diagnosis not present

## 2022-01-23 DIAGNOSIS — Z794 Long term (current) use of insulin: Secondary | ICD-10-CM | POA: Diagnosis not present

## 2022-02-01 DIAGNOSIS — E1169 Type 2 diabetes mellitus with other specified complication: Secondary | ICD-10-CM | POA: Diagnosis not present

## 2022-02-14 DIAGNOSIS — E113512 Type 2 diabetes mellitus with proliferative diabetic retinopathy with macular edema, left eye: Secondary | ICD-10-CM | POA: Diagnosis not present

## 2022-02-14 LAB — HM DIABETES EYE EXAM

## 2022-02-16 NOTE — Progress Notes (Signed)
MRN : [id number]  Laura Reed is a 61 y.o. (1961/05/06) female who presents with chief complaint of check circulation.  History of Present Illness:   The patient returns to the office for followup and review status post angiogram with intervention on 12/04/2020.    Procedure: Percutaneous transluminal angioplasty and stent placement left popliteal arteries to 6 mm 2.   Percutaneous transluminal angioplasty left anterior tibial to 2.5 mm 3.   Mechanical thrombectomy using the Rota Rex catheter left popliteal artery. 4.   Mechanical thrombectomy using the penumbra CAT 6 device left anterior tibial artery.   The patient notes improvement in the lower extremity symptoms. No interval shortening of the patient's claudication distance or rest pain symptoms. Previous wounds have now healed.  No new ulcers or wounds have occurred since the last visit.   There have been no significant changes to the patient's overall health care.   The patient denies amaurosis fugax or recent TIA symptoms. There are no recent neurological changes noted. The patient denies history of DVT, PE or superficial thrombophlebitis. The patient denies recent episodes of angina or shortness of breath.    ABI's Rt=0.96 and Lt=1.04 (previous ABI's Rt=0.98 and Lt=0.95) Duplex US of the left lower extremity arterial system shows patent bypass and stent with uniform velocities.  Carotid duplex shows RICA 9-73% s/p CEA and LICA 53-29%  No outpatient medications have been marked as taking for the 02/17/22 encounter (Appointment) with Delana Meyer, Dolores Lory, MD.    Past Medical History:  Diagnosis Date   Absence of kidney    left   Anxiety    Arthritis    Bladder cancer ([place])    CHF (congestive heart failure) (HCC)    Complication of anesthesia    BP HAS  RUN LOW AFTER SURGERY-LUNGS FILLED UP WITH FLUID AFTER  LEG STENT SURGERY    Coronary artery disease    Diabetes mellitus    Family history of  adverse reaction to anesthesia    Sister - PONV   GERD (gastroesophageal reflux disease)    OCC TUMS   Heart murmur    Hemorrhoid    History of methicillin resistant staphylococcus aureus (MRSA) 2007   Hypertension    Neuropathy    PVD (peripheral vascular disease) (HCC)    Thyroid nodule    right   Urothelial carcinoma of kidney ([place]) 10/31/2014   INVASIVE UROTHELIAL CARCINOMA, LOW GRADE. T1, Nx.   Wears dentures    full upper and lower    Past Surgical History:  Procedure Laterality Date   AMPUTATION TOE     right (4th and 5th); left (great toe, 3rd)   AMPUTATION TOE Right 07/16/2018   Procedure: AMPUTATION TOE/MPJ right 2nd;  Surgeon: Sharlotte Alamo, DPM;  Location: [facility];  Service: Podiatry;  Laterality: Right;   ARTERIAL BYPASS SURGRY  2009, 2013 x 2   right leg , done in [place] Right 01/2014   Dr Delana Meyer   CATARACT EXTRACTION W/PHACO Right 12/14/2014   Procedure: CATARACT EXTRACTION PHACO AND INTRAOCULAR LENS PLACEMENT ([place]);  Surgeon: Lyla Glassing, MD;  Location: [facility];  Service: Ophthalmology;  Laterality: Right;  [place]   00:38.6              AP        7.1  CDE  2.76   CATARACT EXTRACTION W/PHACO Left 12/06/2019   Procedure: CATARACT EXTRACTION PHACO AND INTRAOCULAR LENS PLACEMENT ([place]) LEFT DIABETIC;  Surgeon: Birder Robson, MD;  Location: [facility];  Service: Ophthalmology;  Laterality: Left;  9.08 1:06.4   CESAREAN SECTION     CHOLECYSTECTOMY  03-03-12   Porcelain gallbladder, gallstones,  [place]   COLONOSCOPY W/ BIOPSIES  04/28/2012   Hyperplastic rectal polyps.   CORONARY ARTERY BYPASS GRAFT  2009   3 vessel   CYSTOSCOPY W/ RETROGRADES Right 09/01/2016   Procedure: CYSTOSCOPY WITH RETROGRADE PYELOGRAM;  Surgeon: Hollice Espy, MD;  Location: [facility];  Service: Urology;  Laterality: Right;   CYSTOSCOPY W/ RETROGRADES Bilateral 03/19/2020   Procedure: CYSTOSCOPY WITH RETROGRADE  PYELOGRAM;  Surgeon: Hollice Espy, MD;  Location: [facility];  Service: Urology;  Laterality: Bilateral;   CYSTOSCOPY WITH BIOPSY N/A 03/19/2020   Procedure: CYSTOSCOPY WITH BIOPSY;  Surgeon: Hollice Espy, MD;  Location: [facility];  Service: Urology;  Laterality: N/A;   EYE SURGERY     HERNIA REPAIR  10-31-14   ventral, retro-rectus atrium mesh   IRRIGATION AND DEBRIDEMENT FOOT Left 01/18/2019   Procedure: IRRIGATION AND DEBRIDEMENT FOOT;  Surgeon: Sharlotte Alamo, DPM;  Location: [facility];  Service: Podiatry;  Laterality: Left;   LOWER EXTREMITY ANGIOGRAPHY Left 12/10/2016   Procedure: Lower Extremity Angiography;  Surgeon: Katha Cabal, MD;  Location: [facility];  Service: Cardiovascular;  Laterality: Left;   LOWER EXTREMITY ANGIOGRAPHY Left 02/02/2018   Procedure: LOWER EXTREMITY ANGIOGRAPHY;  Surgeon: Katha Cabal, MD;  Location: [facility];  Service: Cardiovascular;  Laterality: Left;   LOWER EXTREMITY ANGIOGRAPHY Left 05/05/2018   Procedure: LOWER EXTREMITY ANGIOGRAPHY;  Surgeon: Katha Cabal, MD;  Location: [facility];  Service: Cardiovascular;  Laterality: Left;   LOWER EXTREMITY ANGIOGRAPHY Left 12/04/2020   Procedure: LOWER EXTREMITY ANGIOGRAPHY with Intervention;  Surgeon: Katha Cabal, MD;  Location: [facility];  Service: Cardiovascular;  Laterality: Left;   NEPHRECTOMY Left 10-31-14   PERIPHERAL VASCULAR CATHETERIZATION Left 05/01/2015   Procedure: Lower Extremity Angiography;  Surgeon: Katha Cabal, MD;  Location: [facility];  Service: Cardiovascular;  Laterality: Left;   PERIPHERAL VASCULAR CATHETERIZATION  05/01/2015   Procedure: Lower Extremity Intervention;  Surgeon: Katha Cabal, MD;  Location: [facility];  Service: Cardiovascular;;   PERIPHERAL VASCULAR CATHETERIZATION Left 02/20/2015   Procedure: Pelvic Angiography;  Surgeon: Katha Cabal, MD;  Location: [facility];  Service:  Cardiovascular;  Laterality: Left;   TRANSURETHRAL RESECTION OF BLADDER TUMOR WITH MITOMYCIN-C N/A 09/01/2016   Procedure: TRANSURETHRAL RESECTION OF BLADDER TUMOR WITH MITOMYCIN-C;  Surgeon: Hollice Espy, MD;  Location: [facility];  Service: Urology;  Laterality: N/A;    Social History Social History   Tobacco Use   Smoking status: Former    Packs/day: 2.00    Years: 35.00    Total pack years: 70.00    Types: Cigarettes    Quit date: 03/29/2013    Years since quitting: 8.8   Smokeless tobacco: Never  Vaping Use   Vaping Use: Former  Substance Use Topics   Alcohol use: Not Currently    Alcohol/week: 0.0 standard drinks of alcohol    Comment: LAST DRINK 2009   Drug use: Not Currently    Types: Cocaine    Comment: last used in 2007    Family History Family History  Problem Relation Age of Onset  Cancer Mother 68       Lung Cancer   Cancer Father 38       Lung Ca   Diabetes Son    Breast cancer Maternal Grandmother    Kidney cancer Neg Hx    Bladder Cancer Neg Hx    Prostate cancer Neg Hx     No Known Allergies   REVIEW OF SYSTEMS (Negative unless checked)  Constitutional: '[]'$ Weight loss  '[]'$ Fever  '[]'$ Chills Cardiac: '[]'$ Chest pain   '[]'$ Chest pressure   '[]'$ Palpitations   '[]'$ Shortness of breath when laying flat   '[]'$ Shortness of breath with exertion. Vascular:  '[x]'$ Pain in legs with walking   '[]'$ Pain in legs at rest  '[]'$ History of DVT   '[]'$ Phlebitis   '[]'$ Swelling in legs   '[]'$ Varicose veins   '[]'$ Non-healing ulcers Pulmonary:   '[]'$ Uses home oxygen   '[]'$ Productive cough   '[]'$ Hemoptysis   '[]'$ Wheeze  '[]'$ COPD   '[]'$ Asthma Neurologic:  '[]'$ Dizziness   '[]'$ Seizures   '[]'$ History of stroke   '[]'$ History of TIA  '[]'$ Aphasia   '[]'$ Vissual changes   '[]'$ Weakness or numbness in arm   '[]'$ Weakness or numbness in leg Musculoskeletal:   '[]'$ Joint swelling   '[]'$ Joint pain   '[]'$ Low back pain Hematologic:  '[]'$ Easy bruising  '[]'$ Easy bleeding   '[]'$ Hypercoagulable state   '[]'$ Anemic Gastrointestinal:  '[]'$ Diarrhea   '[]'$ Vomiting   '[]'$ Gastroesophageal reflux/heartburn   '[]'$ Difficulty swallowing. Genitourinary:  '[]'$ Chronic kidney disease   '[]'$ Difficult urination  '[]'$ Frequent urination   '[]'$ Blood in urine Skin:  '[]'$ Rashes   '[]'$ Ulcers  Psychological:  '[]'$ History of anxiety   '[]'$  History of major depression.  Physical Examination  There were no vitals filed for this visit. There is no height or weight on file to calculate BMI. Gen: WD/WN, NAD Head: [place]/AT, No temporalis wasting.  Ear/Nose/Throat: Hearing grossly intact, nares w/o erythema or drainage Eyes: PER, EOMI, sclera nonicteric.  Neck: Supple, no masses.  No bruit or JVD.  Pulmonary:  Good air movement, no audible wheezing, no use of accessory muscles.  Cardiac: RRR, normal S1, S2, no Murmurs. Vascular:  mild trophic changes, no open wounds Vessel Right Left  Radial Palpable Palpable  Carotid  Palpable Palpable  PT Not Palpable Not Palpable  DP Not Palpable Not Palpable  Gastrointestinal: soft, non-distended. No guarding/no peritoneal signs.  Musculoskeletal: M/S 5/5 throughout.  No visible deformity.  Neurologic: CN 2-12 intact. Pain and light touch intact in extremities.  Symmetrical.  Speech is fluent. Motor exam as listed above. Psychiatric: Judgment intact, Mood & affect appropriate for pt's clinical situation. Dermatologic: No rashes or ulcers noted.  No changes consistent with cellulitis.   CBC Lab Results  Component Value Date   WBC 9.2 03/15/2020   HGB 13.6 03/15/2020   HCT 37.4 03/15/2020   MCV 81.3 03/15/2020   PLT 274 03/15/2020    BMET    Component Value Date/Time   NA 133 (L) 11/19/2021 1549   NA 136 08/02/2021 1629   NA 135 11/02/2014 0609   K 4.2 11/19/2021 1549   K 4.2 11/02/2014 0609   CL 99 11/19/2021 1549   CL 107 11/02/2014 0609   CO2 26 11/19/2021 1549   CO2 23 11/02/2014 0609   GLUCOSE 213 (H) 11/19/2021 1549   GLUCOSE 108 (H) 11/02/2014 0609   BUN 26 (H) 11/19/2021 1549   BUN 25 08/02/2021 1629   BUN 20 11/02/2014 0609    CREATININE 1.06 11/19/2021 1549   CREATININE 1.01 11/09/2015 1549   CREATININE 1.01 11/09/2015 1549   CALCIUM  9.1 11/19/2021 1549   CALCIUM 7.3 (L) 11/02/2014 0609   GFRNONAA >60 12/04/2020 0921   GFRNONAA 50 (L) 11/02/2014 0609   GFRAA >60 01/19/2019 0357   GFRAA 58 (L) 11/02/2014 0609   CrCl cannot be calculated (Patient's most recent lab result is older than the maximum 21 days allowed.).  COAG Lab Results  Component Value Date   INR 0.9 10/17/2014   INR 1.1 01/19/2014   INR 0.9 12/06/2013    Radiology No results found.   Assessment/Plan 1. Atherosclerosis of native artery of both lower extremities with intermittent claudication (HCC) Recommend:   The patient is status post successful angiogram with intervention.  The patient reports that the claudication symptoms and leg pain is essentially gone.   The patient denies lifestyle limiting changes at this point in time.   No further invasive studies, angiography or surgery at this time The patient should continue walking and begin a more formal exercise program.  The patient should continue antiplatelet therapy and aggressive treatment of the lipid abnormalities   The patient should continue wearing graduated compression socks 10-15 mmHg strength to control the mild edema.   Patient should undergo noninvasive studies as ordered. The patient will follow up with me after the studies. - VAS [place] LOWER EXTREMITY ARTERIAL DUPLEX; Future - VAS [place] ABI WITH/WO TBI; Future  2. Bilateral carotid artery stenosis Recommend:   Given the patient's asymptomatic subcritical stenosis no further invasive testing or surgery at this time.   Duplex ultrasound shows MOQH=<47% and LICA =65-46%.   Continue antiplatelet therapy as prescribed Continue management of CAD, HTN and Hyperlipidemia Healthy heart diet,  encouraged exercise at least 4 times per week Follow up in 12 months with duplex ultrasound and physical exam   3. Coronary artery  disease of native artery of native heart with stable angina pectoris (HCC) Continue cardiac and antihypertensive medications as already ordered and reviewed, no changes at this time.  Continue statin as ordered and reviewed, no changes at this time  Nitrates PRN for chest pain   4. Primary hypertension Continue antihypertensive medications as already ordered, these medications have been reviewed and there are no changes at this time.   5. Controlled type 2 DM with peripheral circulatory disorder (HCC) Continue hypoglycemic medications as already ordered, these medications have been reviewed and there are no changes at this time.  Hgb A1C to be monitored as already arranged by primary service     Hortencia Pilar, MD  02/16/2022 10:53 AM

## 2022-02-17 ENCOUNTER — Ambulatory Visit (INDEPENDENT_AMBULATORY_CARE_PROVIDER_SITE_OTHER): Payer: Medicare Other

## 2022-02-17 ENCOUNTER — Ambulatory Visit (INDEPENDENT_AMBULATORY_CARE_PROVIDER_SITE_OTHER): Payer: Medicare Other | Admitting: Vascular Surgery

## 2022-02-17 ENCOUNTER — Encounter (INDEPENDENT_AMBULATORY_CARE_PROVIDER_SITE_OTHER): Payer: Self-pay | Admitting: Vascular Surgery

## 2022-02-17 VITALS — BP 92/55 | HR 68 | Resp 16 | Wt 138.8 lb

## 2022-02-17 DIAGNOSIS — E1151 Type 2 diabetes mellitus with diabetic peripheral angiopathy without gangrene: Secondary | ICD-10-CM

## 2022-02-17 DIAGNOSIS — I739 Peripheral vascular disease, unspecified: Secondary | ICD-10-CM

## 2022-02-17 DIAGNOSIS — I6523 Occlusion and stenosis of bilateral carotid arteries: Secondary | ICD-10-CM | POA: Diagnosis not present

## 2022-02-17 DIAGNOSIS — I70213 Atherosclerosis of native arteries of extremities with intermittent claudication, bilateral legs: Secondary | ICD-10-CM | POA: Diagnosis not present

## 2022-02-17 DIAGNOSIS — I10 Essential (primary) hypertension: Secondary | ICD-10-CM

## 2022-02-17 DIAGNOSIS — I25118 Atherosclerotic heart disease of native coronary artery with other forms of angina pectoris: Secondary | ICD-10-CM

## 2022-02-17 DIAGNOSIS — Z9889 Other specified postprocedural states: Secondary | ICD-10-CM

## 2022-02-19 ENCOUNTER — Ambulatory Visit: Payer: Medicare Other | Admitting: Internal Medicine

## 2022-03-03 DIAGNOSIS — I10 Essential (primary) hypertension: Secondary | ICD-10-CM | POA: Diagnosis not present

## 2022-03-03 DIAGNOSIS — R001 Bradycardia, unspecified: Secondary | ICD-10-CM | POA: Diagnosis not present

## 2022-03-03 DIAGNOSIS — I498 Other specified cardiac arrhythmias: Secondary | ICD-10-CM | POA: Diagnosis not present

## 2022-03-03 DIAGNOSIS — I25708 Atherosclerosis of coronary artery bypass graft(s), unspecified, with other forms of angina pectoris: Secondary | ICD-10-CM | POA: Diagnosis not present

## 2022-03-03 DIAGNOSIS — E1151 Type 2 diabetes mellitus with diabetic peripheral angiopathy without gangrene: Secondary | ICD-10-CM | POA: Diagnosis not present

## 2022-03-03 DIAGNOSIS — E782 Mixed hyperlipidemia: Secondary | ICD-10-CM | POA: Diagnosis not present

## 2022-03-03 DIAGNOSIS — T50905A Adverse effect of unspecified drugs, medicaments and biological substances, initial encounter: Secondary | ICD-10-CM | POA: Diagnosis not present

## 2022-03-04 DIAGNOSIS — E1169 Type 2 diabetes mellitus with other specified complication: Secondary | ICD-10-CM | POA: Diagnosis not present

## 2022-03-07 ENCOUNTER — Other Ambulatory Visit: Payer: Self-pay | Admitting: General Surgery

## 2022-03-07 NOTE — Progress Notes (Signed)
Progress Notes - documented in this encounter Laura Reed, Geronimo Boot, MD - 12/31/2021 2:15 PM EDT Formatting of this note is different from the original. Subjective:   Patient ID: Laura Reed is a 61 y.o. female.  HPI  The following portions of the patient's history were reviewed and updated as appropriate.  This an established patient is here today for: office visit. The patient has been referred today by Dr. Derrel Nip for evaluation of a colonoscopy due to a positive cologuard test. Patient reports she has bowel movements once per day. The patient denies any bleeding or mucus.   Patient states she did have a stress test done by Dr. Alveria Apley office on 12-19-21. She states she is awaiting results.   Chief Complaint  Patient presents with  Pre-op Exam    BP 120/58  Pulse 79  Temp 36.7 C (98 F)  Ht 170.2 cm ('5\' 7"'$ )  Wt 63 kg (139 lb)  LMP (LMP Unknown)  SpO2 96%  BMI 21.77 kg/m   Past Medical History:  Diagnosis Date  Anxiety  Arthritis  CAD (coronary artery disease)  Patient denies history of MI. CABG was obtained before she had heart damage. Echocardiogram performed Dr. Ubaldo Glassing 2010 showed normal LV function and mild AI, trivial MR, TR and PI.  Carotid artery stenosis 07/07/2008  MODERATE  Diabetes mellitus type 2, uncomplicated (CMS-HCC)  Had gestational diabetes at age 28, thinks she developed true diabetes age 61 and really didn't start taking care of herself until about a decade ago.  Diabetic retinopathy (CMS-HCC)  Diabetes mellitus complicated with diabetic retinopathy, macular edema.  GERD (gastroesophageal reflux disease)  [place]  History of foot ulcer  Right foot ulceration secondary to atherosclerosis  HTN (hypertension)  Hyperlipidemia  Murmur, cardiac, unspecified  Neuropathy  PVD (peripheral vascular disease) (CMS-HCC)  Tobacco use  History of previous tobacco use  Urothelial carcinoma of kidney (CMS-HCC) 10/31/2014    Past Surgical History:   Procedure Laterality Date  Iliac artery stent Right 06/06/2008  FEMORAL ARTERY - POPLITEAL ARTERY BYPASS GRAFT Right 06/23/2008  CABG 07/07/2008  [place], [place]  Pilonidal cyst surgery 09/04/2009  Dr. Marina Gravel  CORONARY ANGIOPLASTY Left 05/28/2010  left common femoral artery 05/28/10.  CHOLECYSTECTOMY 03/03/2012  COLONOSCOPY 04/27/2012  Dr Bary Castilla  CAROTID ENDARTERECTOMY Right 2015  NEPHRECTOMY Left 10/31/2014  CATARACT EXTRACTION Right 12/14/2014  AMPUTATION TOE 07/16/2018  CATARACT EXTRACTION Left 12/06/2019  CYSTOSCOPY 03/19/2020  2018  excision mid chest wall cyst 10/17/2021  CESAREAN SECTION  Two  Leg catheterizations, several  See history    OB History   Gravida  2  Para  2  Term   Preterm   AB   Living     SAB   IAB   Ectopic   Molar   Multiple   Live Births     Obstetric Comments  Age at first period 62 Age of first pregnancy 35 Age at menopause 82      Social History   Socioeconomic History  Marital status: Legally Separated  Tobacco Use  Smoking status: Former  Packs/day: 2.00  Years: 35.00  Pack years: 70.00  Types: Cigarettes  Quit date: 03/29/2013  Years since quitting: 8.7  Smokeless tobacco: Never  Vaping Use  Vaping Use: Never used  Substance and Sexual Activity  Alcohol use: No  Drug use: Never    No Known Allergies  Current Outpatient Medications  Medication Sig Dispense Refill  ACCU-CHEK AVIVA PLUS TEST STRP test strip 4 (four) times daily before meals  and nightly  ALPRAZolam (XANAX) 0.5 MG tablet Take 0.5 mg by mouth nightly as needed for Sleep  amLODIPine (NORVASC) 5 MG tablet Take 1 tablet by mouth once daily  aspirin 81 MG EC tablet Take 81 mg by mouth once daily.  atorvastatin (LIPITOR) 20 MG tablet Take 20 mg by mouth once daily 1  BD NANO 2ND GEN PEN NEEDLE 32 gauge x 5/32" Ndle USE TO TAKE INSULIN DAILY AS DIRECTED  cholecalciferol (VITAMIN D3) 2,000 unit capsule Take 2,000 Units by mouth once  daily  clopidogrel (PLAVIX) 75 mg tablet Take 75 mg by mouth once daily.  gabapentin (NEURONTIN) 800 MG tablet Take 800 mg by mouth 2 (two) times daily  insulin DEGLUDEC (TRESIBA FLEXTOUCH U-100) pen injector (concentration 100 units/mL) Inject 20 Units subcutaneously at bedtime  insulin LISPRO (HUMALOG KWIKPEN) pen injector (concentration 100 units/mL) Inject subcutaneously 3 (three) times daily with meals Sliding Scale  JARDIANCE 10 mg tablet  JARDIANCE 25 mg tablet Take 1 tablet by mouth once daily  losartan (COZAAR) 50 MG tablet Take 50 mg by mouth once daily  multivitamin tablet Take 1 tablet by mouth once daily  vit C,E-Zn-coppr-lutein-zeaxan (OCUVITE LUTEIN AND ZEAXANTHIN) 60 mg-13.5 mg- 15 mg-2 mg-6 mg Cap Take 1 capsule by mouth once daily  semaglutide (OZEMPIC) 0.25 mg or 0.5 mg(2 mg/1.5 mL) pen injector Inject 0.25 mg once weekly for 4 weeks, then increase to 0.5 mg weekly (Patient not taking: Reported on 11/21/2021)   No current facility-administered medications for this visit.   Family History  Problem Relation Age of Onset  Lung cancer Mother  Heart disease Mother  Lung cancer Father  Heart disease Father  Breast cancer Maternal Grandmother  Diabetes Son  Prostate cancer Neg Hx  Bladder Cancer Neg Hx  Kidney cancer Neg Hx   Labs and Radiology:   Nov 19, 2021 laboratory:  Sodium 135 - 145 mEq/L 133 Low  Potassium 3.5 - 5.1 mEq/L 4.2  Chloride 96 - 112 mEq/L 99  CO2 19 - 32 mEq/L 26  Glucose, Bld 70 - 99 mg/dL 213 High  BUN 6 - 23 mg/dL 26 High  Creatinine, Ser 0.40 - 1.20 mg/dL 1.06  Total Bilirubin 0.2 - 1.2 mg/dL 0.5  Alkaline Phosphatase 39 - 117 U/L 83  AST 0 - 37 U/L 18  ALT 0 - 35 U/L 21  Total Protein 6.0 - 8.3 g/dL 6.8  Albumin 3.5 - 5.2 g/dL 4.0  GFR >60.00 mL/min 56.03 Low   Hemoglobin A1C 4.0 - 5.6 % 7.7 Abnormal   Component Ref Range & Units 1 mo ago COLOGUARD Negative Positive Abnormal   December 19, 2021 cardiac echo:   DOPPLER ECHO and OTHER  SPECIAL PROCEDURES Aortic: TRIVIAL AR No AS 278.0 cm/sec peak vel 30.9 mmHg peak grad 11.5 mmHg mean grad 0.94 cm^2 by DOPPLER Mitral: MILD MR No MS MV Inflow E Vel = 138.0 cm/sec MV Annulus E'Vel = 6.0 cm/sec E/E'Ratio = 23.0 Tricuspid: MILD TR No TS 221.0 cm/sec peak TR vel 22.5 mmHg peak RV pressure Pulmonary: TRIVIAL PR No PS 112.0 cm/sec peak vel 5.0 mmHg peak grad _________________________________________________________________________________________ INTERPRETATION NORMAL LEFT VENTRICULAR SYSTOLIC FUNCTION NORMAL RIGHT VENTRICULAR SYSTOLIC FUNCTION MILD VALVULAR REGURGITATION (See above) NO VALVULAR STENOSIS ______________________________  Review of Systems  Constitutional: Negative for chills and fever.  Respiratory: Negative for cough.    Objective:  Physical Exam Exam conducted with a chaperone present.  Constitutional:  Appearance: Normal appearance.  Cardiovascular:  Rate and Rhythm: Normal rate and  regular rhythm.  Pulses: Normal pulses.  Heart sounds: Normal heart sounds.  Pulmonary:  Effort: Pulmonary effort is normal.  Breath sounds: Normal breath sounds.  Musculoskeletal:  Cervical back: Neck supple.  Skin: General: Skin is warm and dry.  Neurological:  Mental Status: She is alert and oriented to person, place, and time.  Psychiatric:  Mood and Affect: Mood normal.  Behavior: Behavior normal.    Assessment:   Positive Cologuard.  Plan:   Patient is a candidate for colonoscopy. It will require cessation of Plavix for 5 days. She may continue her aspirin. We will await clearance from Dr. Nehemiah Massed and cardiology.   This note is partially prepared by Ledell Noss, CMA acting as a scribe in the presence of Dr. Hervey Ard, MD.   The documentation recorded by the scribe accurately reflects the service I personally performed and the decisions made by me.   Robert Bellow, MD FACS  Electronically signed by Mayer Masker, MD  at 01/01/2022 8:37 PM EDT

## 2022-03-13 ENCOUNTER — Other Ambulatory Visit: Payer: Self-pay | Admitting: Internal Medicine

## 2022-03-25 ENCOUNTER — Telehealth: Payer: Self-pay | Admitting: Internal Medicine

## 2022-03-25 MED ORDER — GLUCOSE BLOOD VI STRP: ORAL_STRIP | 12 refills | Status: AC

## 2022-03-25 NOTE — Telephone Encounter (Signed)
Test strips have been filled and a sensor has been placed up front for pick up. Pt is aware.

## 2022-03-25 NOTE — Telephone Encounter (Signed)
Pt called stating she need a refill on test strips sent to [facility] and she would like to see if we have any Freestyle libre sensor samples

## 2022-04-02 ENCOUNTER — Encounter
Admission: RE | Disposition: A | Discharge status: Home / Self Care | Payer: Self-pay | Source: Home / Self Care | Attending: General Surgery

## 2022-04-02 ENCOUNTER — Ambulatory Visit: Payer: Medicare Other | Admitting: Anesthesiology

## 2022-04-02 ENCOUNTER — Encounter: Payer: Self-pay | Admitting: General Surgery

## 2022-04-02 ENCOUNTER — Ambulatory Visit
Admission: RE | Admit: 2022-04-02 | Discharge: 2022-04-02 | Disposition: A | Discharge status: Home / Self Care | Payer: Medicare Other | Attending: General Surgery | Admitting: General Surgery

## 2022-04-02 DIAGNOSIS — Z794 Long term (current) use of insulin: Secondary | ICD-10-CM | POA: Insufficient documentation

## 2022-04-02 DIAGNOSIS — Z951 Presence of aortocoronary bypass graft: Secondary | ICD-10-CM | POA: Diagnosis not present

## 2022-04-02 DIAGNOSIS — K219 Gastro-esophageal reflux disease without esophagitis: Secondary | ICD-10-CM | POA: Insufficient documentation

## 2022-04-02 DIAGNOSIS — I2510 Atherosclerotic heart disease of native coronary artery without angina pectoris: Secondary | ICD-10-CM | POA: Diagnosis not present

## 2022-04-02 DIAGNOSIS — I110 Hypertensive heart disease with heart failure: Secondary | ICD-10-CM | POA: Insufficient documentation

## 2022-04-02 DIAGNOSIS — D124 Benign neoplasm of descending colon: Secondary | ICD-10-CM | POA: Insufficient documentation

## 2022-04-02 DIAGNOSIS — I509 Heart failure, unspecified: Secondary | ICD-10-CM | POA: Diagnosis not present

## 2022-04-02 DIAGNOSIS — Z8551 Personal history of malignant neoplasm of bladder: Secondary | ICD-10-CM | POA: Insufficient documentation

## 2022-04-02 DIAGNOSIS — I739 Peripheral vascular disease, unspecified: Secondary | ICD-10-CM | POA: Insufficient documentation

## 2022-04-02 DIAGNOSIS — Z87891 Personal history of nicotine dependence: Secondary | ICD-10-CM | POA: Diagnosis not present

## 2022-04-02 DIAGNOSIS — Z1211 Encounter for screening for malignant neoplasm of colon: Secondary | ICD-10-CM | POA: Diagnosis not present

## 2022-04-02 DIAGNOSIS — Z905 Acquired absence of kidney: Secondary | ICD-10-CM | POA: Diagnosis not present

## 2022-04-02 DIAGNOSIS — E119 Type 2 diabetes mellitus without complications: Secondary | ICD-10-CM | POA: Insufficient documentation

## 2022-04-02 DIAGNOSIS — Z85528 Personal history of other malignant neoplasm of kidney: Secondary | ICD-10-CM | POA: Diagnosis not present

## 2022-04-02 DIAGNOSIS — F419 Anxiety disorder, unspecified: Secondary | ICD-10-CM | POA: Diagnosis not present

## 2022-04-02 DIAGNOSIS — D126 Benign neoplasm of colon, unspecified: Secondary | ICD-10-CM | POA: Diagnosis not present

## 2022-04-02 DIAGNOSIS — R195 Other fecal abnormalities: Secondary | ICD-10-CM | POA: Insufficient documentation

## 2022-04-02 DIAGNOSIS — Z8614 Personal history of Methicillin resistant Staphylococcus aureus infection: Secondary | ICD-10-CM | POA: Insufficient documentation

## 2022-04-02 DIAGNOSIS — K635 Polyp of colon: Secondary | ICD-10-CM | POA: Diagnosis not present

## 2022-04-02 HISTORY — PX: COLONOSCOPY WITH PROPOFOL: SHX5780

## 2022-04-02 LAB — GLUCOSE, CAPILLARY: Glucose-Capillary: 284 mg/dL — ABNORMAL HIGH (ref 70–99)

## 2022-04-02 SURGERY — COLONOSCOPY WITH PROPOFOL
Anesthesia: General

## 2022-04-02 MED ORDER — PROPOFOL 10 MG/ML IV BOLUS
INTRAVENOUS | Status: DC | PRN
  Administered 2022-04-02: 70 mg via INTRAVENOUS
  Administered 2022-04-02: 30 mg via INTRAVENOUS

## 2022-04-02 MED ORDER — MIDAZOLAM HCL 5 MG/5ML IJ SOLN
INTRAMUSCULAR | Status: DC | PRN
  Administered 2022-04-02: 2 mg via INTRAVENOUS

## 2022-04-02 MED ORDER — PROPOFOL 500 MG/50ML IV EMUL
INTRAVENOUS | Status: DC | PRN
  Administered 2022-04-02: 120 ug/kg/min via INTRAVENOUS

## 2022-04-02 MED ORDER — SODIUM CHLORIDE 0.9 % IV SOLN
INTRAVENOUS | Status: DC
  Administered 2022-04-02: 1000 mL via INTRAVENOUS

## 2022-04-02 MED ORDER — LIDOCAINE 2% (20 MG/ML) 5 ML SYRINGE
INTRAMUSCULAR | Status: DC | PRN
  Administered 2022-04-02: 20 mg via INTRAVENOUS

## 2022-04-02 MED ORDER — MIDAZOLAM HCL 2 MG/2ML IJ SOLN
INTRAMUSCULAR | Status: AC
  Filled 2022-04-02: qty 2

## 2022-04-02 NOTE — Anesthesia Postprocedure Evaluation (Signed)
Anesthesia Post Note  Patient: Laura Reed  Procedure(s) Performed: COLONOSCOPY WITH PROPOFOL  Patient location during evaluation: Endoscopy Anesthesia Type: General Level of consciousness: awake and alert Pain management: pain level controlled Vital Signs Assessment: post-procedure vital signs reviewed and stable Respiratory status: spontaneous breathing, nonlabored ventilation and respiratory function stable Cardiovascular status: blood pressure returned to baseline and stable Postop Assessment: no apparent nausea or vomiting Anesthetic complications: no   No notable events documented.   Last Vitals:  Vitals:   04/02/22 1024 04/02/22 1044  BP: (!) 141/96 (!) 141/70  Pulse: 76 70  Resp: (!) 21   Temp: (!) 35.8 C   SpO2: 100% 100%    Last Pain:  Vitals:   04/02/22 1044  TempSrc:   PainSc: 0-No pain                 [place] Ouch

## 2022-04-02 NOTE — H&P (Signed)
Laura Reed [id number] 04/19/1961     HPI:  61 y/o woman with a positive Cologuard test.    Medications Prior to Admission  Medication Sig Dispense Refill Last Dose   ALPRAZolam (XANAX) 0.5 MG tablet TAKE 1 TABLET(0.5 MG) BY MOUTH DAILY AS NEEDED FOR ANXIETY 30 tablet 5 04/01/2022   amLODipine (NORVASC) 2.5 MG tablet TAKE 1 TABLET(2.5 MG) BY MOUTH DAILY 90 tablet 1 04/01/2022   aspirin 81 MG chewable tablet Chew 81 mg by mouth at bedtime.    Past Week   atorvastatin (LIPITOR) 20 MG tablet TAKE 1 TABLET(20 MG) BY MOUTH DAILY 90 tablet 3 04/01/2022   BIOTIN PO Take 1 tablet by mouth daily.   Past Week   Cholecalciferol (VITAMIN D3) 2000 UNITS TABS Take 2,000 Units by mouth daily after supper.    Past Week   Continuous Blood Gluc Sensor (FREESTYLE LIBRE 2 SENSOR) MISC Use to check sugar at least 4 times daily 2 each 2 04/01/2022   empagliflozin (JARDIANCE) 25 MG TABS tablet TAKE 1 TABLET(25 MG) BY MOUTH DAILY BEFORE AND BREAKFAST 90 tablet 2 Past Week   gabapentin (NEURONTIN) 800 MG tablet TAKE 1 TABLET(800 MG) BY MOUTH THREE TIMES DAILY 270 tablet 3 04/01/2022   glucose blood test strip Use you check blood sugars up to four times daily. 100 each 12 04/01/2022   insulin degludec (TRESIBA FLEXTOUCH) 100 UNIT/ML FlexTouch Pen Inject 16 Units into the skin daily. 30 mL 2 04/01/2022   insulin lispro (HUMALOG KWIKPEN) 100 UNIT/ML KwikPen ADMINISTER 10 UNITS UNDER THE SKIN THREE TIMES DAILY BEFORE MEALS 15 mL 0 Past Week   Insulin Pen Needle (PEN NEEDLES) 32G X 4 MM MISC Use to take insulin daily 100 each 3 04/01/2022   losartan (COZAAR) 50 MG tablet TAKE 1 TABLET(50 MG) BY MOUTH DAILY 90 tablet 1 04/01/2022   Multiple Vitamin (MULTIVITAMIN) tablet Take 1 tablet by mouth daily.   Past Week   multivitamin-lutein (OCUVITE-LUTEIN) CAPS capsule Take 1 capsule by mouth daily.   Past Week   acetaminophen (TYLENOL) 500 MG tablet Take 1,000 mg by mouth every 6 (six) hours as needed for mild pain or headache.      at prn   clopidogrel (PLAVIX) 75 MG tablet TAKE 1 TABLET(75 MG) BY MOUTH DAILY 90 tablet 1 03/28/2022   Semaglutide, 2 MG/DOSE, 8 MG/3ML SOPN Inject 2 mg as directed once a week. (Patient not taking: Reported on 04/02/2022) 3 mL 5 Not Taking   No Known Allergies Past Medical History:  Diagnosis Date   Absence of kidney    left   Anxiety    Arthritis    Bladder cancer (HCC)    CHF (congestive heart failure) (HCC)    Complication of anesthesia    BP HAS  RUN LOW AFTER SURGERY-LUNGS FILLED UP WITH FLUID AFTER  LEG STENT SURGERY    Coronary artery disease    Diabetes mellitus    Family history of adverse reaction to anesthesia    Sister - PONV   GERD (gastroesophageal reflux disease)    OCC TUMS   Heart murmur    Hemorrhoid    History of methicillin resistant staphylococcus aureus (MRSA) 2007   Hypertension    Neuropathy    PVD (peripheral vascular disease) (HCC)    Thyroid nodule    right   Urothelial carcinoma of kidney ([place]) 10/31/2014   INVASIVE UROTHELIAL CARCINOMA, LOW GRADE. T1, Nx.   Wears dentures    full upper and lower  Past Surgical History:  Procedure Laterality Date   AMPUTATION TOE     right (4th and 5th); left (great toe, 3rd)   AMPUTATION TOE Right 07/16/2018   Procedure: AMPUTATION TOE/MPJ right 2nd;  Surgeon: Sharlotte Alamo, DPM;  Location: [facility];  Service: Podiatry;  Laterality: Right;   ARTERIAL BYPASS SURGRY  2009, 2013 x 2   right leg , done in [place] Right 01/2014   Dr Delana Meyer   CATARACT EXTRACTION W/PHACO Right 12/14/2014   Procedure: CATARACT EXTRACTION PHACO AND INTRAOCULAR LENS PLACEMENT ([place]);  Surgeon: Lyla Glassing, MD;  Location: [facility];  Service: Ophthalmology;  Laterality: Right;  [place]   00:38.6              AP        7.1                   CDE  2.76   CATARACT EXTRACTION W/PHACO Left 12/06/2019   Procedure: CATARACT EXTRACTION PHACO AND INTRAOCULAR LENS PLACEMENT ([place]) LEFT DIABETIC;   Surgeon: Birder Robson, MD;  Location: [place];  Service: Ophthalmology;  Laterality: Left;  9.08 1:06.4   CESAREAN SECTION     CHOLECYSTECTOMY  03-03-12   Porcelain gallbladder, gallstones,  Sedra Morfin   COLONOSCOPY W/ BIOPSIES  04/28/2012   Hyperplastic rectal polyps.   CORONARY ARTERY BYPASS GRAFT  2009   3 vessel   CYSTOSCOPY W/ RETROGRADES Right 09/01/2016   Procedure: CYSTOSCOPY WITH RETROGRADE PYELOGRAM;  Surgeon: Hollice Espy, MD;  Location: [facility];  Service: Urology;  Laterality: Right;   CYSTOSCOPY W/ RETROGRADES Bilateral 03/19/2020   Procedure: CYSTOSCOPY WITH RETROGRADE PYELOGRAM;  Surgeon: Hollice Espy, MD;  Location: [facility];  Service: Urology;  Laterality: Bilateral;   CYSTOSCOPY WITH BIOPSY N/A 03/19/2020   Procedure: CYSTOSCOPY WITH BIOPSY;  Surgeon: Hollice Espy, MD;  Location: [facility];  Service: Urology;  Laterality: N/A;   EYE SURGERY     HERNIA REPAIR  10-31-14   ventral, retro-rectus atrium mesh   IRRIGATION AND DEBRIDEMENT FOOT Left 01/18/2019   Procedure: IRRIGATION AND DEBRIDEMENT FOOT;  Surgeon: Sharlotte Alamo, DPM;  Location: [facility];  Service: Podiatry;  Laterality: Left;   LOWER EXTREMITY ANGIOGRAPHY Left 12/10/2016   Procedure: Lower Extremity Angiography;  Surgeon: Katha Cabal, MD;  Location: [place] [facility];  Service: Cardiovascular;  Laterality: Left;   LOWER EXTREMITY ANGIOGRAPHY Left 02/02/2018   Procedure: LOWER EXTREMITY ANGIOGRAPHY;  Surgeon: Katha Cabal, MD;  Location: [place] [facility];  Service: Cardiovascular;  Laterality: Left;   LOWER EXTREMITY ANGIOGRAPHY Left 05/05/2018   Procedure: LOWER EXTREMITY ANGIOGRAPHY;  Surgeon: Katha Cabal, MD;  Location: [place] [facility];  Service: Cardiovascular;  Laterality: Left;   LOWER EXTREMITY ANGIOGRAPHY Left 12/04/2020   Procedure: LOWER EXTREMITY ANGIOGRAPHY with Intervention;  Surgeon: Katha Cabal, MD;  Location: [place] [facility];  Service:  Cardiovascular;  Laterality: Left;   NEPHRECTOMY Left 10-31-14   PERIPHERAL VASCULAR CATHETERIZATION Left 05/01/2015   Procedure: Lower Extremity Angiography;  Surgeon: Katha Cabal, MD;  Location: [place] [facility];  Service: Cardiovascular;  Laterality: Left;   PERIPHERAL VASCULAR CATHETERIZATION  05/01/2015   Procedure: Lower Extremity Intervention;  Surgeon: Katha Cabal, MD;  Location: [place] [facility];  Service: Cardiovascular;;   PERIPHERAL VASCULAR CATHETERIZATION Left 02/20/2015   Procedure: Pelvic Angiography;  Surgeon: Katha Cabal, MD;  Location: [place] [facility];  Service: Cardiovascular;  Laterality: Left;   TRANSURETHRAL RESECTION OF BLADDER TUMOR WITH MITOMYCIN-C N/A 09/01/2016   Procedure: TRANSURETHRAL RESECTION OF BLADDER TUMOR WITH MITOMYCIN-C;  Surgeon: Hollice Espy, MD;  Location: [facility];  Service: Urology;  Laterality: N/A;   Social History   Socioeconomic History   Marital status: Legally Separated    Spouse name: Not on file   Number of children: 2   Years of education: 12   Highest education level: 12th grade  Occupational History   Occupation: Disabled  Tobacco Use   Smoking status: Former    Packs/day: 2.00    Years: 35.00    Total pack years: 70.00    Types: Cigarettes    Quit date: 03/29/2013    Years since quitting: 9.0   Smokeless tobacco: Never  Vaping Use   Vaping Use: Former  Substance and Sexual Activity   Alcohol use: Not Currently    Alcohol/week: 0.0 standard drinks of alcohol    Comment: LAST DRINK 2009   Drug use: Not Currently    Types: Cocaine    Comment: last used in 2007   Sexual activity: Not Currently  Other Topics Concern   Not on file  Social History Narrative   Not on file   Social Determinants of Health   Financial Resource Strain: Low Risk  (06/11/2021)   Overall Financial Resource Strain (CARDIA)    Difficulty of Paying Living Expenses: Not hard at all  Food Insecurity: No Food Insecurity  (06/10/2021)   Hunger Vital Sign    Worried About Running Out of Food in the Last Year: Never true    [place] in the Last Year: Never true  Transportation Needs: No Transportation Needs (06/10/2021)   PRAPARE - Hydrologist (Medical): No    Lack of Transportation (Non-Medical): No  Physical Activity: Inactive (06/10/2021)   Exercise Vital Sign    Days of Exercise per Week: 0 days    Minutes of Exercise per Session: 0 min  Stress: No Stress Concern Present (06/10/2021)   [place]    Feeling of Stress : Only a little  Recent Concern: Stress - Stress Concern Present (03/12/2021)   [place]    Feeling of Stress : Rather much  Social Connections: Moderately Isolated (06/10/2021)   Social Connection and Isolation Panel [NHANES]    Frequency of Communication with Friends and Family: More than three times a week    Frequency of Social Gatherings with Friends and Family: More than three times a week    Attends Religious Services: 1 to 4 times per year    Active Member of [employer] or Organizations: No    Attends Archivist Meetings: Never    Marital Status: Separated  Intimate Partner Violence: Not At Risk (06/10/2021)   Humiliation, Afraid, Rape, and Kick questionnaire    Fear of Current or Ex-Partner: No    Emotionally Abused: No    Physically Abused: No    Sexually Abused: No   Social History   Social History Narrative   Not on file     ROS: Negative.     PE: HEENT: Negative. Lungs: Clear. Cardio: RR.   Assessment/Plan:  Proceed with planned endoscopy.   [facility] Gle[facility] 04/02/2022

## 2022-04-02 NOTE — Anesthesia Preprocedure Evaluation (Addendum)
Anesthesia Evaluation  Patient identified by MRN, date of birth, ID band Patient awake    Reviewed: Allergy & Precautions, NPO status , Patient's Chart, lab work & pertinent test results  History of Anesthesia Complications Negative for: history of anesthetic complications  Airway Mallampati: II  TM Distance: >3 FB Neck ROM: Full    Dental  (+) Lower Dentures, Upper Dentures   Pulmonary neg sleep apnea, neg COPD, former smoker,    breath sounds clear to auscultation- rhonchi (-) wheezing      Cardiovascular hypertension, + CAD, + CABG (CABG x 3 [place] 2009), + Peripheral Vascular Disease (Right popliteal artery bypass 06/23/2008) and +CHF  (-) Cardiac Stents  Rhythm:Regular Rate:Normal - Systolic murmurs and - Diastolic murmurs S/P carotid endarterectomy  ECHO 12/2021: INTERPRETATION  NORMAL LEFT VENTRICULAR SYSTOLIC FUNCTION  NORMAL RIGHT VENTRICULAR SYSTOLIC FUNCTION  MILD VALVULAR REGURGITATION (See above)  NO VALVULAR STENOSIS   12/2021: MPS LVEF= 56%  FINDINGS:  Regional wall motion: reveals normal myocardial thickening and wall  motion.  The overall quality of the study is good.   Artifacts noted: no  Left ventricular cavity: normal.   Perfusion Analysis: SPECT images demonstrate moderate perfusion  abnormality of mild intensity is present in the distal anterior and apical  myocardial region on the stress images. Resting images showed normal  myocardial perfusion consistent with the distal anterior and apical  ischemia. Defect type : Reversible     Neuro/Psych neg Seizures PSYCHIATRIC DISORDERS Anxiety negative neurological ROS     GI/Hepatic Neg liver ROS, GERD  ,  Endo/Other  diabetes, Type 2, Insulin Dependent  Renal/GU Renal disease: s/p nephrectomy for renal tumor.     Musculoskeletal  (+) Arthritis ,   Abdominal (+) - obese,   Peds  Hematology negative hematology ROS (+)    Anesthesia Other Findings Past Medical History: No date: Absence of kidney     Comment:  left No date: Anxiety No date: Arthritis No date: Bladder cancer (HCC) No date: CHF (congestive heart failure) (HCC) No date: Complication of anesthesia     Comment:  BP HAS  RUN LOW AFTER SURGERY-LUNGS FILLED UP WITH FLUID              AFTER  LEG STENT SURGERY  No date: Coronary artery disease No date: Diabetes mellitus No date: Family history of adverse reaction to anesthesia     Comment:  Sister - PONV No date: GERD (gastroesophageal reflux disease)     Comment:  OCC TUMS No date: Heart murmur No date: Hemorrhoid 2007: History of methicillin resistant staphylococcus aureus (MRSA) No date: Hypertension No date: Neuropathy No date: PVD (peripheral vascular disease) ([place]) No date: Thyroid nodule     Comment:  right 10/31/2014: Urothelial carcinoma of kidney ([place])     Comment:  INVASIVE UROTHELIAL CARCINOMA, LOW GRADE. T1, Nx. No date: Wears dentures     Comment:  full upper and lower   Reproductive/Obstetrics                            Anesthesia Physical  Anesthesia Plan  ASA: III  Anesthesia Plan: General   Post-op Pain Management:    Induction: Intravenous  PONV Risk Score and Plan: 2 and Propofol infusion and TIVA  Airway Management Planned: Natural Airway  Additional Equipment:   Intra-op Plan:   Post-operative Plan:   Informed Consent: I have reviewed the patients History and Physical, chart, labs and discussed  the procedure including the risks, benefits and alternatives for the proposed anesthesia with the patient or authorized representative who has indicated his/her understanding and acceptance.     Dental advisory given  Plan Discussed with: CRNA and Anesthesiologist  Anesthesia Plan Comments:        Anesthesia Quick Evaluation

## 2022-04-02 NOTE — Op Note (Signed)
[facility] Gastroenterology Patient Name: Laura Reed Procedure Date: 04/02/2022 9:34 AM MRN: [id number] Account #: [id number] Date of Birth: March 02, 1961 Admit Type: Outpatient Age: 61 Room: [facility] ENDO ROOM 1 Gender: Female Note Status: Finalized Instrument Name: Peds Colonoscope [id number] Procedure:             Colonoscopy Indications:           Positive Cologuard test Providers:             Robert Bellow, MD Referring MD:          Robert Bellow, MD (Referring MD), Deborra Medina,                         MD (Referring MD) Medicines:             Propofol per Anesthesia Complications:         No immediate complications. Procedure:             Pre-Anesthesia Assessment:                        - Prior to the procedure, a History and Physical was                         performed, and patient medications, allergies and                         sensitivities were reviewed. The patient's tolerance                         of previous anesthesia was reviewed.                        - The risks and benefits of the procedure and the                         sedation options and risks were discussed with the                         patient. All questions were answered and informed                         consent was obtained.                        After obtaining informed consent, the colonoscope was                         passed under direct vision. Throughout the procedure,                         the patient's blood pressure, pulse, and oxygen                         saturations were monitored continuously. The                         Colonoscope was introduced through the anus and                         advanced to  the the cecum, identified by appendiceal                         orifice and ileocecal valve. The colonoscopy was                         performed without difficulty. The patient tolerated                         the procedure well. The quality of  the bowel                         preparation was excellent. Findings:      A 6 mm polyp was found in the descending colon. The polyp was sessile.       Biopsies were taken with a cold forceps for histology. Completely       removed with biopsy x 2.      The retroflexed view of the distal rectum and anal verge was normal and       showed no anal or rectal abnormalities. Impression:            - One 6 mm polyp in the descending colon. Biopsied.                        - The distal rectum and anal verge are normal on                         retroflexion view. Recommendation:        - Telephone endoscopist for pathology results in 1                         week. Procedure Code(s):     --- Professional ---                        [id number], Colonoscopy, flexible; with biopsy, single or                         multiple Diagnosis Code(s):     --- Professional ---                        K63.5, Polyp of colon                        R19.5, Other fecal abnormalities CPT copyright 2019 American Medical Association. All rights reserved. The codes documented in this report are preliminary and upon coder review may  be revised to meet current compliance requirements. Robert Bellow, MD 04/02/2022 10:20:36 AM This report has been signed electronically. Number of Addenda: 0 Note Initiated On: 04/02/2022 9:34 AM Scope Withdrawal Time: 0 hours 17 minutes 36 seconds  Total Procedure Duration: 0 hours 29 minutes 18 seconds  Estimated Blood Loss:  Estimated blood loss was minimal.      [facility]

## 2022-04-02 NOTE — Transfer of Care (Signed)
Immediate Anesthesia Transfer of Care Note  Patient: Laura Reed  Procedure(s) Performed: COLONOSCOPY WITH PROPOFOL  Patient Location: Endoscopy Unit  Anesthesia Type:General  Level of Consciousness: awake and alert   Airway & Oxygen Therapy: Patient Spontanous Breathing  Post-op Assessment: Report given to RN and Post -op Vital signs reviewed and stable  Post vital signs: Reviewed  Last Vitals:  Vitals Value Taken Time  BP 141/96 04/02/22 1024  Temp    Pulse    Resp 30 04/02/22 1024  SpO2    Vitals shown include unvalidated device data.  Last Pain:  Vitals:   04/02/22 0913  TempSrc: Temporal  PainSc: 0-No pain         Complications: No notable events documented.

## 2022-04-03 ENCOUNTER — Encounter: Payer: Self-pay | Admitting: General Surgery

## 2022-04-03 DIAGNOSIS — D126 Benign neoplasm of colon, unspecified: Secondary | ICD-10-CM

## 2022-04-03 LAB — SURGICAL PATHOLOGY

## 2022-04-04 DIAGNOSIS — E1169 Type 2 diabetes mellitus with other specified complication: Secondary | ICD-10-CM | POA: Diagnosis not present

## 2022-04-07 ENCOUNTER — Ambulatory Visit (INDEPENDENT_AMBULATORY_CARE_PROVIDER_SITE_OTHER): Payer: Medicare Other | Admitting: Internal Medicine

## 2022-04-07 ENCOUNTER — Encounter: Payer: Self-pay | Admitting: Internal Medicine

## 2022-04-07 VITALS — BP 144/78 | HR 84 | Temp 97.8°F | Ht 67.0 in | Wt 141.0 lb

## 2022-04-07 DIAGNOSIS — E1142 Type 2 diabetes mellitus with diabetic polyneuropathy: Secondary | ICD-10-CM | POA: Diagnosis not present

## 2022-04-07 DIAGNOSIS — E1169 Type 2 diabetes mellitus with other specified complication: Secondary | ICD-10-CM

## 2022-04-07 DIAGNOSIS — E785 Hyperlipidemia, unspecified: Secondary | ICD-10-CM

## 2022-04-07 DIAGNOSIS — R21 Rash and other nonspecific skin eruption: Secondary | ICD-10-CM

## 2022-04-07 DIAGNOSIS — I10 Essential (primary) hypertension: Secondary | ICD-10-CM

## 2022-04-07 DIAGNOSIS — I739 Peripheral vascular disease, unspecified: Secondary | ICD-10-CM | POA: Diagnosis not present

## 2022-04-07 DIAGNOSIS — Z23 Encounter for immunization: Secondary | ICD-10-CM | POA: Diagnosis not present

## 2022-04-07 DIAGNOSIS — E1151 Type 2 diabetes mellitus with diabetic peripheral angiopathy without gangrene: Secondary | ICD-10-CM | POA: Diagnosis not present

## 2022-04-07 LAB — LDL CHOLESTEROL, DIRECT: Direct LDL: 48 mg/dL

## 2022-04-07 LAB — COMPREHENSIVE METABOLIC PANEL
ALT: 16 U/L (ref 0–35)
AST: 15 U/L (ref 0–37)
Albumin: 4.2 g/dL (ref 3.5–5.2)
Alkaline Phosphatase: 92 U/L (ref 39–117)
BUN: 20 mg/dL (ref 6–23)
CO2: 29 mEq/L (ref 19–32)
Calcium: 9.1 mg/dL (ref 8.4–10.5)
Chloride: 99 mEq/L (ref 96–112)
Creatinine, Ser: 0.93 mg/dL (ref 0.40–1.20)
GFR: 65.39 mL/min (ref >60.00)
Glucose, Bld: 176 mg/dL — ABNORMAL HIGH (ref 70–99)
Potassium: 4.1 mEq/L (ref 3.5–5.1)
Sodium: 135 mEq/L (ref 135–145)
Total Bilirubin: 0.5 mg/dL (ref 0.2–1.2)
Total Protein: 6.9 g/dL (ref 6.0–8.3)

## 2022-04-07 LAB — HEMOGLOBIN A1C: Hgb A1c MFr Bld: 7.9 % — ABNORMAL HIGH (ref 4.6–6.5)

## 2022-04-07 LAB — LIPID PANEL
Cholesterol: 116 mg/dL (ref 0–200)
HDL: 60.3 mg/dL (ref >39.00)
LDL Cholesterol: 43 mg/dL (ref 0–99)
NonHDL: 55.97
Total CHOL/HDL Ratio: 2
Triglycerides: 63 mg/dL (ref 0.0–149.0)
VLDL: 12.6 mg/dL (ref 0.0–40.0)

## 2022-04-07 MED ORDER — LOSARTAN POTASSIUM 100 MG PO TABS: 100.0000 mg | ORAL_TABLET | Freq: Every day | ORAL | 1 refills | Status: DC

## 2022-04-07 MED ORDER — TRIAMCINOLONE ACETONIDE 0.5 % EX OINT: 1.0000 | TOPICAL_OINTMENT | Freq: Two times a day (BID) | CUTANEOUS | 0 refills | Status: DC

## 2022-04-07 NOTE — Progress Notes (Signed)
Subjective:  Patient ID: Laura Reed, female    DOB: [birth year]  Age: 61 y.o. MRN: [id number]  CC: The primary encounter diagnosis was Controlled type 2 DM with peripheral circulatory disorder ([place]). Diagnoses of Primary hypertension, Hyperlipidemia associated with type 2 diabetes mellitus ([place]), Need for immunization against influenza, PVD (peripheral vascular disease) ([place]), Diabetic peripheral neuropathy associated with type 2 diabetes mellitus ([place]), and Rash and nonspecific skin eruption were also pertinent to this visit.   HPI Laura Reed presents for  Chief Complaint  Patient presents with   Follow-up    3 month follow up on diabetes   1) Type 2 DM:  stopped ozempic  6 months ago due to supply issues.  Blood sugars have been elevated due to stress eating.  Fasting sugars have been 140 or higher and post prandials have ranged from 140 to 180. Marland Kitchen  She has been taking 20 units  of tresiba  but not using humalog tid qac,  but taking 2 or 3 units prn if her random cbg is elevated   2) HTN:  taking 50 mg losartan  3) she had a colonoscopy Sept 27  . Has not had a BM since then.  She has   has chronic constipation   4)  neck rash present for 2 months,  itchy,  nape of neck and up into hairline.  Has 5 indoor cats at home.  has changed soaps and using dove body wash on entire body .    Outpatient Medications Prior to Visit  Medication Sig Dispense Refill   acetaminophen (TYLENOL) 500 MG tablet Take 1,000 mg by mouth every 6 (six) hours as needed for mild pain or headache.      ALPRAZolam (XANAX) 0.5 MG tablet TAKE 1 TABLET(0.5 MG) BY MOUTH DAILY AS NEEDED FOR ANXIETY 30 tablet 5   amLODipine (NORVASC) 2.5 MG tablet TAKE 1 TABLET(2.5 MG) BY MOUTH DAILY 90 tablet 1   aspirin 81 MG chewable tablet Chew 81 mg by mouth at bedtime.      atorvastatin (LIPITOR) 20 MG tablet TAKE 1 TABLET(20 MG) BY MOUTH DAILY 90 tablet 3   BIOTIN PO Take 1 tablet by mouth daily.     Cholecalciferol  (VITAMIN D3) 2000 UNITS TABS Take 2,000 Units by mouth daily after supper.      clopidogrel (PLAVIX) 75 MG tablet TAKE 1 TABLET(75 MG) BY MOUTH DAILY 90 tablet 1   Continuous Blood Gluc Sensor (FREESTYLE LIBRE 2 SENSOR) MISC Use to check sugar at least 4 times daily 2 each 2   empagliflozin (JARDIANCE) 25 MG TABS tablet TAKE 1 TABLET(25 MG) BY MOUTH DAILY BEFORE AND BREAKFAST 90 tablet 2   gabapentin (NEURONTIN) 800 MG tablet TAKE 1 TABLET(800 MG) BY MOUTH THREE TIMES DAILY 270 tablet 3   glucose blood test strip Use you check blood sugars up to four times daily. 100 each 12   insulin degludec (TRESIBA FLEXTOUCH) 100 UNIT/ML FlexTouch Pen Inject 16 Units into the skin daily. (Patient taking differently: Inject 20 Units into the skin daily.) 30 mL 2   insulin lispro (HUMALOG KWIKPEN) 100 UNIT/ML KwikPen ADMINISTER 10 UNITS UNDER THE SKIN THREE TIMES DAILY BEFORE MEALS (Patient taking differently: ADMINISTER 10 UNITS UNDER THE SKIN THREE TIMES DAILY BEFORE MEALS PRN) 15 mL 0   Insulin Pen Needle (PEN NEEDLES) 32G X 4 MM MISC Use to take insulin daily 100 each 3   Multiple Vitamin (MULTIVITAMIN) tablet Take 1 tablet by mouth daily.  multivitamin-lutein (OCUVITE-LUTEIN) CAPS capsule Take 1 capsule by mouth daily.     losartan (COZAAR) 50 MG tablet TAKE 1 TABLET(50 MG) BY MOUTH DAILY 90 tablet 1   Semaglutide, 2 MG/DOSE, 8 MG/3ML SOPN Inject 2 mg as directed once a week. (Patient not taking: Reported on 04/07/2022) 3 mL 5   No facility-administered medications prior to visit.    Review of Systems;  Patient denies headache, fevers, malaise, unintentional weight loss, eye pain, sinus congestion and sinus pain, sore throat, dysphagia,  hemoptysis , cough, dyspnea, wheezing, chest pain, palpitations, orthopnea, edema, abdominal pain, nausea, melena, diarrhea, constipation, flank pain, dysuria, hematuria, urinary  Frequency, nocturia, numbness, tingling, seizures,  Focal weakness, Loss of consciousness,   Tremor, insomnia, depression, anxiety, and suicidal ideation.      Objective:  BP (!) 144/78 (BP Location: Left Arm, Patient Position: Sitting, Cuff Size: Normal)   Pulse 84   Temp 97.8 F (36.6 C) (Oral)   Ht $R'5\' 7"'Nr$  (1.702 m)   Wt 141 lb (64 kg)   SpO2 99%   BMI 22.08 kg/m   BP Readings from Last 3 Encounters:  04/07/22 (!) 144/78  04/02/22 (!) 141/70  02/17/22 (!) 92/55    Wt Readings from Last 3 Encounters:  04/07/22 141 lb (64 kg)  04/02/22 138 lb 4.4 oz (62.7 kg)  02/17/22 138 lb 12.8 oz (63 kg)    General appearance: alert, cooperative and appears older than stated age Throat: lips, mucosa, and tongue normal; teeth and gums normal Neck: no adenopathy, no carotid bruit, supple, symmetrical, trachea midline and thyroid not enlarged, symmetric, no tenderness/mass/nodules Back: symmetric, no curvature. ROM normal. No CVA tenderness. Lungs: clear to auscultation bilaterally Heart: regular rate and rhythm, S1, S2 normal, no murmur, click, rub or gallop Abdomen: soft, non-tender; bowel sounds normal; no masses,  no organomegaly Pulses: 2+ and symmetric Skin: scattered effaced tiny papules covering nape of neck , lower occipital scalp , no erythema or scaling  Lymph nodes: Cervical, supraclavicular, and axillary nodes normal. Neuro:  awake and interactive with normal mood and affect. Higher cortical functions are normal. Speech is clear without word-finding difficulty or dysarthria. Extraocular movements are intact. Visual fields of both eyes are grossly intact. Sensation to light touch is grossly intact bilaterally of upper and lower extremities. Motor examination shows 4+/5 symmetric hand grip and upper extremity and 5/5 lower extremity strength. There is no pronation or drift. Gait is non-ataxic   Lab Results  Component Value Date   HGBA1C 7.9 (H) 04/07/2022   HGBA1C 7.7 (A) 11/19/2021   HGBA1C 7.5 (H) 08/02/2021    Lab Results  Component Value Date   CREATININE 0.93  04/07/2022   CREATININE 1.06 11/19/2021   CREATININE 0.99 08/02/2021    Lab Results  Component Value Date   WBC 9.2 03/15/2020   HGB 13.6 03/15/2020   HCT 37.4 03/15/2020   PLT 274 03/15/2020   GLUCOSE 176 (H) 04/07/2022   CHOL 116 04/07/2022   TRIG 63.0 04/07/2022   HDL 60.30 04/07/2022   LDLDIRECT 48.0 04/07/2022   LDLCALC 43 04/07/2022   ALT 16 04/07/2022   AST 15 04/07/2022   NA 135 04/07/2022   K 4.1 04/07/2022   CL 99 04/07/2022   CREATININE 0.93 04/07/2022   BUN 20 04/07/2022   CO2 29 04/07/2022   TSH 0.946 08/02/2021   INR 0.9 10/17/2014   HGBA1C 7.9 (H) 04/07/2022   MICROALBUR 2.1 (H) 11/19/2021    No results found.  Assessment &  Plan:   Problem List Items Addressed This Visit     Controlled type 2 DM with peripheral circulatory disorder ([place]) - Primary    She continues to have frequent follow up with Vascular for surveillance of carotid stenosis and aortoiliac disease. She has lost control of diabetes again.  Instructed on proper use of humalog.  asekd to check pre and post prandials for one week and submit readings soe a slidnig scale can be determined .  Continue 20 units of tresiba and 25 mg jardiance ,  100 mg losartan and 20 mg atorvastatin .   Lab Results  Component Value Date   HGBA1C 7.9 (H) 04/07/2022   Lab Results  Component Value Date   CHOL 116 04/07/2022   HDL 60.30 04/07/2022   LDLCALC 43 04/07/2022   LDLDIRECT 48.0 04/07/2022   TRIG 63.0 04/07/2022   CHOLHDL 2 04/07/2022   Lab Results  Component Value Date   LABMICR See below: 11/06/2021   LABMICR See below: 03/27/2021   MICROALBUR 2.1 (H) 11/19/2021   MICROALBUR 2.0 (H) 11/15/2020          Relevant Medications   losartan (COZAAR) 100 MG tablet   Other Relevant Orders   HgB A1c (Completed)   Comp Met (CMET) (Completed)   Hypertension   Relevant Medications   losartan (COZAAR) 100 MG tablet   Other Relevant Orders   Comp Met (CMET) (Completed)   PVD (peripheral vascular  disease) ([place])    She maintains regular follow up with vascular surgery to surveillance of carotids and iliac disease with prior surgical interventions .  She is taking aspirin and atorvastatin      Relevant Medications   losartan (COZAAR) 100 MG tablet   Diabetic peripheral neuropathy associated with type 2 diabetes mellitus ([place])    She requires periodic DME FOR DIABETIC SHOES given her neuroapthy, PAD and prior amputations      Relevant Medications   losartan (COZAAR) 100 MG tablet   Hyperlipidemia associated with type 2 diabetes mellitus (HCC)   Relevant Medications   losartan (COZAAR) 100 MG tablet   Other Relevant Orders   Lipid Profile (Completed)   Direct LDL (Completed)   Rash and nonspecific skin eruption    Triamcinolone ointment prescribed.  Not wearing any necklaces that may be causing contact dermatitis.  Advised to change her pillow as this may be the source,        Other Visit Diagnoses     Need for immunization against influenza       Relevant Orders   Flu Vaccine QUAD 5mo+IM (Fluarix, Fluzone & Alfiuria Quad PF) (Completed)      Follow-up: Return in about 3 months (around 07/08/2022).   [name], MD

## 2022-04-07 NOTE — Patient Instructions (Addendum)
The humalog is a fast acting insulin and should always be taken BEFORE YOU START EATING.  THE DOSE WILL DEPEND ON YOUR PRE MEAL BLOOD SUGAR,   AND THE CARBOHYDRATE CONTENT OF THE MEAL    FOR THE NEXT WEEK, KEEP A FOOD DIARY OF WHAT YOU EAT .  DON'T CHANGE ANYTHING AND DON'T TAKE ANY HUMALOG   CHECK BLOOD SUGAR BEFORE YOU EAT , AND  2 HOURS AFTER YOU EAT   INCREASE LOSARTAN  TO 100 MG   Start taking 100 mg of colace at bedtime ; increase to 200 mg if needed (docusate is the ingredient)  Triamcinolone Is a stronger ointment for neck ; you can use it twice daily on your neck  Change your pillow

## 2022-04-08 DIAGNOSIS — R21 Rash and other nonspecific skin eruption: Secondary | ICD-10-CM | POA: Insufficient documentation

## 2022-04-08 NOTE — Assessment & Plan Note (Signed)
She requires periodic DME FOR DIABETIC SHOES given her neuroapthy, PAD and prior amputations

## 2022-04-08 NOTE — Assessment & Plan Note (Signed)
Triamcinolone ointment prescribed.  Not wearing any necklaces that may be causing contact dermatitis.  Advised to change her pillow as this may be the source,

## 2022-04-08 NOTE — Assessment & Plan Note (Addendum)
She maintains regular follow up with vascular surgery to surveillance of carotids and iliac disease with prior surgical interventions .  She is taking aspirin and atorvastatin

## 2022-04-08 NOTE — Assessment & Plan Note (Addendum)
She continues to have frequent follow up with Vascular for surveillance of carotid stenosis and aortoiliac disease. She has lost control of diabetes again.  Instructed on proper use of humalog.  asekd to check pre and post prandials for one week and submit readings soe a slidnig scale can be determined .  Continue 20 units of tresiba and 25 mg jardiance ,  100 mg losartan and 20 mg atorvastatin .   Lab Results  Component Value Date   HGBA1C 7.9 (H) 04/07/2022   Lab Results  Component Value Date   CHOL 116 04/07/2022   HDL 60.30 04/07/2022   LDLCALC 43 04/07/2022   LDLDIRECT 48.0 04/07/2022   TRIG 63.0 04/07/2022   CHOLHDL 2 04/07/2022   Lab Results  Component Value Date   LABMICR See below: 11/06/2021   LABMICR See below: 03/27/2021   MICROALBUR 2.1 (H) 11/19/2021   MICROALBUR 2.0 (H) 11/15/2020

## 2022-04-11 ENCOUNTER — Other Ambulatory Visit: Payer: Self-pay

## 2022-04-11 DIAGNOSIS — I10 Essential (primary) hypertension: Secondary | ICD-10-CM

## 2022-04-11 MED ORDER — AMLODIPINE BESYLATE 2.5 MG PO TABS: ORAL_TABLET | ORAL | 1 refills | Status: DC

## 2022-04-24 DIAGNOSIS — E113512 Type 2 diabetes mellitus with proliferative diabetic retinopathy with macular edema, left eye: Secondary | ICD-10-CM | POA: Diagnosis not present

## 2022-05-05 ENCOUNTER — Encounter (INDEPENDENT_AMBULATORY_CARE_PROVIDER_SITE_OTHER): Payer: Self-pay

## 2022-05-05 DIAGNOSIS — E1169 Type 2 diabetes mellitus with other specified complication: Secondary | ICD-10-CM | POA: Diagnosis not present

## 2022-05-12 DIAGNOSIS — L851 Acquired keratosis [keratoderma] palmaris et plantaris: Secondary | ICD-10-CM | POA: Diagnosis not present

## 2022-05-12 DIAGNOSIS — B351 Tinea unguium: Secondary | ICD-10-CM | POA: Diagnosis not present

## 2022-05-12 DIAGNOSIS — E1140 Type 2 diabetes mellitus with diabetic neuropathy, unspecified: Secondary | ICD-10-CM | POA: Diagnosis not present

## 2022-05-12 DIAGNOSIS — Z794 Long term (current) use of insulin: Secondary | ICD-10-CM | POA: Diagnosis not present

## 2022-05-13 ENCOUNTER — Other Ambulatory Visit: Payer: Medicare Other | Admitting: Urology

## 2022-05-16 ENCOUNTER — Other Ambulatory Visit (INDEPENDENT_AMBULATORY_CARE_PROVIDER_SITE_OTHER): Payer: Self-pay | Admitting: Vascular Surgery

## 2022-05-16 DIAGNOSIS — I739 Peripheral vascular disease, unspecified: Secondary | ICD-10-CM

## 2022-05-20 ENCOUNTER — Ambulatory Visit: Payer: Medicare Other | Admitting: Urology

## 2022-05-21 NOTE — Progress Notes (Unsigned)
MRN : [id number]  Laura Reed is a 61 y.o. (1960/08/28) female who presents with chief complaint of check circulation.  History of Present Illness:  The patient returns to the office for followup and review status post angiogram with intervention on 12/04/2020.    Procedure: Percutaneous transluminal angioplasty and stent placement left popliteal arteries to 6 mm 2.   Percutaneous transluminal angioplasty left anterior tibial to 2.5 mm 3.   Mechanical thrombectomy using the [place] catheter left popliteal artery. 4.   Mechanical thrombectomy using the penumbra CAT 6 device left anterior tibial artery.   The patient notes improvement in the lower extremity symptoms. No interval shortening of the patient's claudication distance or rest pain symptoms. Previous wounds have now healed.  No new ulcers or wounds have occurred since the last visit.   There have been no significant changes to the patient's overall health care.   The patient denies amaurosis fugax or recent TIA symptoms. There are no recent neurological changes noted. The patient denies history of DVT, PE or superficial thrombophlebitis. The patient denies recent episodes of angina or shortness of breath.    ABI's Rt=1.16 and Lt=1.10 (previous ABI's Rt=0.96 and Lt=1.04) Duplex US of the left lower extremity arterial system shows patent bypass and stent with uniform velocities.  No significant changes compared to last visit   Previous carotid duplex shows RICA 5-36% s/p CEA and LICA 14-43%   No outpatient medications have been marked as taking for the 05/22/22 encounter (Appointment) with Delana Meyer, Dolores Lory, MD.    Past Medical History:  Diagnosis Date   Absence of kidney    left   Anxiety    Arthritis    Bladder cancer ([place])    CHF (congestive heart failure) (HCC)    Complication of anesthesia    BP HAS  RUN LOW AFTER SURGERY-LUNGS FILLED UP WITH FLUID AFTER  LEG STENT SURGERY    Coronary artery  disease    Diabetes mellitus    Family history of adverse reaction to anesthesia    Sister - PONV   GERD (gastroesophageal reflux disease)    OCC TUMS   Heart murmur    Hemorrhoid    History of methicillin resistant staphylococcus aureus (MRSA) 2007   Hypertension    Neuropathy    PVD (peripheral vascular disease) (HCC)    Thyroid nodule    right   Urothelial carcinoma of kidney ([place]) 10/31/2014   INVASIVE UROTHELIAL CARCINOMA, LOW GRADE. T1, Nx.   Wears dentures    full upper and lower    Past Surgical History:  Procedure Laterality Date   AMPUTATION TOE     right (4th and 5th); left (great toe, 3rd)   AMPUTATION TOE Right 07/16/2018   Procedure: AMPUTATION TOE/MPJ right 2nd;  Surgeon: Sharlotte Alamo, DPM;  Location: [facility];  Service: Podiatry;  Laterality: Right;   ARTERIAL BYPASS SURGRY  2009, 2013 x 2   right leg , done in [place] Right 01/2014   Dr Delana Meyer   CATARACT EXTRACTION W/PHACO Right 12/14/2014   Procedure: CATARACT EXTRACTION PHACO AND INTRAOCULAR LENS PLACEMENT ([place]);  Surgeon: Lyla Glassing, MD;  Location: [facility];  Service: Ophthalmology;  Laterality: Right;  [place]   00:38.6              AP        7.1  CDE  2.76   CATARACT EXTRACTION W/PHACO Left 12/06/2019   Procedure: CATARACT EXTRACTION PHACO AND INTRAOCULAR LENS PLACEMENT ([place]) LEFT DIABETIC;  Surgeon: Birder Robson, MD;  Location: [facility];  Service: Ophthalmology;  Laterality: Left;  9.08 1:06.4   CESAREAN SECTION     CHOLECYSTECTOMY  03-03-12   Porcelain gallbladder, gallstones,  [place]   COLONOSCOPY W/ BIOPSIES  04/28/2012   Hyperplastic rectal polyps.   COLONOSCOPY WITH PROPOFOL N/A 04/02/2022   Procedure: COLONOSCOPY WITH PROPOFOL;  Surgeon: Robert Bellow, MD;  Location: [facility];  Service: Endoscopy;  Laterality: N/A;   CORONARY ARTERY BYPASS GRAFT  2009   3 vessel   CYSTOSCOPY W/ RETROGRADES Right  09/01/2016   Procedure: CYSTOSCOPY WITH RETROGRADE PYELOGRAM;  Surgeon: Hollice Espy, MD;  Location: [facility];  Service: Urology;  Laterality: Right;   CYSTOSCOPY W/ RETROGRADES Bilateral 03/19/2020   Procedure: CYSTOSCOPY WITH RETROGRADE PYELOGRAM;  Surgeon: Hollice Espy, MD;  Location: [facility];  Service: Urology;  Laterality: Bilateral;   CYSTOSCOPY WITH BIOPSY N/A 03/19/2020   Procedure: CYSTOSCOPY WITH BIOPSY;  Surgeon: Hollice Espy, MD;  Location: [facility];  Service: Urology;  Laterality: N/A;   EYE SURGERY     HERNIA REPAIR  10-31-14   ventral, retro-rectus atrium mesh   IRRIGATION AND DEBRIDEMENT FOOT Left 01/18/2019   Procedure: IRRIGATION AND DEBRIDEMENT FOOT;  Surgeon: Sharlotte Alamo, DPM;  Location: [facility];  Service: Podiatry;  Laterality: Left;   LOWER EXTREMITY ANGIOGRAPHY Left 12/10/2016   Procedure: Lower Extremity Angiography;  Surgeon: Katha Cabal, MD;  Location: [facility];  Service: Cardiovascular;  Laterality: Left;   LOWER EXTREMITY ANGIOGRAPHY Left 02/02/2018   Procedure: LOWER EXTREMITY ANGIOGRAPHY;  Surgeon: Katha Cabal, MD;  Location: [facility];  Service: Cardiovascular;  Laterality: Left;   LOWER EXTREMITY ANGIOGRAPHY Left 05/05/2018   Procedure: LOWER EXTREMITY ANGIOGRAPHY;  Surgeon: Katha Cabal, MD;  Location: [facility];  Service: Cardiovascular;  Laterality: Left;   LOWER EXTREMITY ANGIOGRAPHY Left 12/04/2020   Procedure: LOWER EXTREMITY ANGIOGRAPHY with Intervention;  Surgeon: Katha Cabal, MD;  Location: [place] [facility];  Service: Cardiovascular;  Laterality: Left;   NEPHRECTOMY Left 10-31-14   PERIPHERAL VASCULAR CATHETERIZATION Left 05/01/2015   Procedure: Lower Extremity Angiography;  Surgeon: Katha Cabal, MD;  Location: [facility];  Service: Cardiovascular;  Laterality: Left;   PERIPHERAL VASCULAR CATHETERIZATION  05/01/2015   Procedure: Lower Extremity Intervention;  Surgeon:  Katha Cabal, MD;  Location: [facility];  Service: Cardiovascular;;   PERIPHERAL VASCULAR CATHETERIZATION Left 02/20/2015   Procedure: Pelvic Angiography;  Surgeon: Katha Cabal, MD;  Location: [facility];  Service: Cardiovascular;  Laterality: Left;   TRANSURETHRAL RESECTION OF BLADDER TUMOR WITH MITOMYCIN-C N/A 09/01/2016   Procedure: TRANSURETHRAL RESECTION OF BLADDER TUMOR WITH MITOMYCIN-C;  Surgeon: Hollice Espy, MD;  Location: [facility];  Service: Urology;  Laterality: N/A;    Social History Social History   Tobacco Use   Smoking status: Former    Packs/day: 2.00    Years: 35.00    Total pack years: 70.00    Types: Cigarettes    Quit date: 03/29/2013    Years since quitting: 9.1   Smokeless tobacco: Never  Vaping Use   Vaping Use: Former  Substance Use Topics   Alcohol use: Not Currently    Alcohol/week: 0.0 standard drinks of alcohol    Comment: LAST DRINK 2009   Drug use: Not  Currently    Types: Cocaine    Comment: last used in 2007    Family History Family History  Problem Relation Age of Onset   Cancer Mother 15       Lung Cancer   Cancer Father 19       Lung Ca   Diabetes Son    Breast cancer Maternal Grandmother    Kidney cancer Neg Hx    Bladder Cancer Neg Hx    Prostate cancer Neg Hx     No Known Allergies   REVIEW OF SYSTEMS (Negative unless checked)  Constitutional: '[]'$ Weight loss  '[]'$ Fever  '[]'$ Chills Cardiac: '[]'$ Chest pain   '[]'$ Chest pressure   '[]'$ Palpitations   '[]'$ Shortness of breath when laying flat   '[]'$ Shortness of breath with exertion. Vascular:  '[x]'$ Pain in legs with walking   '[]'$ Pain in legs at rest  '[]'$ History of DVT   '[]'$ Phlebitis   '[]'$ Swelling in legs   '[]'$ Varicose veins   '[]'$ Non-healing ulcers Pulmonary:   '[]'$ Uses home oxygen   '[]'$ Productive cough   '[]'$ Hemoptysis   '[]'$ Wheeze  '[]'$ COPD   '[]'$ Asthma Neurologic:  '[]'$ Dizziness   '[]'$ Seizures   '[]'$ History of stroke   '[]'$ History of TIA  '[]'$ Aphasia   '[]'$ Vissual changes   '[]'$ Weakness or numbness  in arm   '[]'$ Weakness or numbness in leg Musculoskeletal:   '[]'$ Joint swelling   '[]'$ Joint pain   '[]'$ Low back pain Hematologic:  '[]'$ Easy bruising  '[]'$ Easy bleeding   '[]'$ Hypercoagulable state   '[]'$ Anemic Gastrointestinal:  '[]'$ Diarrhea   '[]'$ Vomiting  '[]'$ Gastroesophageal reflux/heartburn   '[]'$ Difficulty swallowing. Genitourinary:  '[]'$ Chronic kidney disease   '[]'$ Difficult urination  '[]'$ Frequent urination   '[]'$ Blood in urine Skin:  '[]'$ Rashes   '[]'$ Ulcers  Psychological:  '[]'$ History of anxiety   '[]'$  History of major depression.  Physical Examination  There were no vitals filed for this visit. There is no height or weight on file to calculate BMI. Gen: WD/WN, NAD Head: [place]/AT, No temporalis wasting.  Ear/Nose/Throat: Hearing grossly intact, nares w/o erythema or drainage Eyes: PER, EOMI, sclera nonicteric.  Neck: Supple, no masses.  No bruit or JVD.  Pulmonary:  Good air movement, no audible wheezing, no use of accessory muscles.  Cardiac: RRR, normal S1, S2, no Murmurs. Vascular:  mild trophic changes, no open wounds Vessel Right Left  Radial Palpable Palpable  PT Not Palpable Not Palpable  DP Not Palpable Not Palpable  Gastrointestinal: soft, non-distended. No guarding/no peritoneal signs.  Musculoskeletal: M/S 5/5 throughout.  No visible deformity.  Neurologic: CN 2-12 intact. Pain and light touch intact in extremities.  Symmetrical.  Speech is fluent. Motor exam as listed above. Psychiatric: Judgment intact, Mood & affect appropriate for pt's clinical situation. Dermatologic: No rashes or ulcers noted.  No changes consistent with cellulitis.   CBC Lab Results  Component Value Date   WBC 9.2 03/15/2020   HGB 13.6 03/15/2020   HCT 37.4 03/15/2020   MCV 81.3 03/15/2020   PLT 274 03/15/2020    BMET    Component Value Date/Time   NA 135 04/07/2022 1129   NA 136 08/02/2021 1629   NA 135 11/02/2014 0609   K 4.1 04/07/2022 1129   K 4.2 11/02/2014 0609   CL 99 04/07/2022 1129   CL 107 11/02/2014 0609    CO2 29 04/07/2022 1129   CO2 23 11/02/2014 0609   GLUCOSE 176 (H) 04/07/2022 1129   GLUCOSE 108 (H) 11/02/2014 0609   BUN 20 04/07/2022 1129   BUN 25 08/02/2021 1629   BUN 20 11/02/2014  5625   CREATININE 0.93 04/07/2022 1129   CREATININE 1.01 11/09/2015 1549   CREATININE 1.01 11/09/2015 1549   CALCIUM 9.1 04/07/2022 1129   CALCIUM 7.3 (L) 11/02/2014 0609   GFRNONAA >60 12/04/2020 0921   GFRNONAA 50 (L) 11/02/2014 0609   GFRAA >60 01/19/2019 0357   GFRAA 58 (L) 11/02/2014 0609   CrCl cannot be calculated (Patient's most recent lab result is older than the maximum 21 days allowed.).  COAG Lab Results  Component Value Date   INR 0.9 10/17/2014   INR 1.1 01/19/2014   INR 0.9 12/06/2013    Radiology No results found.   Assessment/Plan 1. Atherosclerosis of native artery of both lower extremities with intermittent claudication (HCC) Recommend:   The patient is status post successful angiogram with intervention.  The patient reports that the claudication symptoms and leg pain is essentially gone.   The patient denies lifestyle limiting changes at this point in time.   No further invasive studies, angiography or surgery at this time The patient should continue walking and begin a more formal exercise program.  The patient should continue antiplatelet therapy and aggressive treatment of the lipid abnormalities   The patient should continue wearing graduated compression socks 10-15 mmHg strength to control the mild edema.   Patient should undergo noninvasive studies as ordered. The patient will follow up with me after the studies. - VAS [place] LOWER EXTREMITY ARTERIAL DUPLEX; Future - VAS [place] ABI WITH/WO TBI; Future  2. Bilateral carotid artery stenosis Recommend:   Given the patient's asymptomatic subcritical stenosis no further invasive testing or surgery at this time.   Duplex ultrasound shows WLSL=<37% and LICA =34-28%.   Continue antiplatelet therapy as  prescribed Continue management of CAD, HTN and Hyperlipidemia Healthy heart diet,  encouraged exercise at least 4 times per week Follow up in 6 months with duplex ultrasound and physical exam  - VAS US CAROTID; Future  3. Coronary artery disease of native artery of native heart with stable angina pectoris (HCC) Continue cardiac and antihypertensive medications as already ordered and reviewed, no changes at this time.  Continue statin as ordered and reviewed, no changes at this time  Nitrates PRN for chest pain  4. Primary hypertension Continue antihypertensive medications as already ordered, these medications have been reviewed and there are no changes at this time.  5. Diabetic peripheral neuropathy associated with type 2 diabetes mellitus ([place]) Continue hypoglycemic medications as already ordered, these medications have been reviewed and there are no changes at this time.  Hgb A1C to be monitored as already arranged by primary service  6. Hyperlipidemia associated with type 2 diabetes mellitus ([place]) Continue statin as ordered and reviewed, no changes at this time  7. PVD (peripheral vascular disease) ([place]) See #1 - VAS [place] ABI WITH/WO TBI    Hortencia Pilar, MD  05/21/2022 2:32 PM

## 2022-05-22 ENCOUNTER — Encounter (INDEPENDENT_AMBULATORY_CARE_PROVIDER_SITE_OTHER): Payer: Self-pay | Admitting: Vascular Surgery

## 2022-05-22 ENCOUNTER — Ambulatory Visit (INDEPENDENT_AMBULATORY_CARE_PROVIDER_SITE_OTHER): Payer: Medicare Other

## 2022-05-22 ENCOUNTER — Ambulatory Visit (INDEPENDENT_AMBULATORY_CARE_PROVIDER_SITE_OTHER): Payer: Medicare Other | Admitting: Vascular Surgery

## 2022-05-22 VITALS — BP 134/70 | HR 70 | Resp 16 | Wt 144.0 lb

## 2022-05-22 DIAGNOSIS — E1142 Type 2 diabetes mellitus with diabetic polyneuropathy: Secondary | ICD-10-CM

## 2022-05-22 DIAGNOSIS — E1169 Type 2 diabetes mellitus with other specified complication: Secondary | ICD-10-CM | POA: Diagnosis not present

## 2022-05-22 DIAGNOSIS — I70213 Atherosclerosis of native arteries of extremities with intermittent claudication, bilateral legs: Secondary | ICD-10-CM

## 2022-05-22 DIAGNOSIS — I10 Essential (primary) hypertension: Secondary | ICD-10-CM

## 2022-05-22 DIAGNOSIS — I739 Peripheral vascular disease, unspecified: Secondary | ICD-10-CM

## 2022-05-22 DIAGNOSIS — I6523 Occlusion and stenosis of bilateral carotid arteries: Secondary | ICD-10-CM | POA: Diagnosis not present

## 2022-05-22 DIAGNOSIS — E785 Hyperlipidemia, unspecified: Secondary | ICD-10-CM

## 2022-05-22 DIAGNOSIS — I25118 Atherosclerotic heart disease of native coronary artery with other forms of angina pectoris: Secondary | ICD-10-CM | POA: Diagnosis not present

## 2022-05-28 ENCOUNTER — Ambulatory Visit: Payer: Medicare Other | Admitting: Urology

## 2022-06-05 DIAGNOSIS — E1169 Type 2 diabetes mellitus with other specified complication: Secondary | ICD-10-CM | POA: Diagnosis not present

## 2022-06-10 ENCOUNTER — Other Ambulatory Visit: Payer: Medicare Other | Admitting: Urology

## 2022-06-12 ENCOUNTER — Ambulatory Visit (INDEPENDENT_AMBULATORY_CARE_PROVIDER_SITE_OTHER): Payer: Medicare Other

## 2022-06-12 VITALS — Ht 67.0 in | Wt 144.0 lb

## 2022-06-12 DIAGNOSIS — Z0000 Encounter for general adult medical examination without abnormal findings: Secondary | ICD-10-CM | POA: Diagnosis not present

## 2022-06-12 NOTE — Patient Instructions (Addendum)
Laura Reed , Thank you for taking time to come for your Medicare Wellness Visit. I appreciate your ongoing commitment to your health goals. Please review the following plan we discussed and let me know if I can assist you in the future.   These are the goals we discussed:  Goals      DIET - REDUCE SUGAR INTAKE     A1C within range Increase physical activity         This is a list of the screening recommended for you and due dates:  Health Maintenance  Topic Date Due   Screening for Lung Cancer  Never done   DTaP/Tdap/Td vaccine (2 - Td or Tdap) 03/16/2022   COVID-19 Vaccine (6 - 2023-24 season) 06/28/2022*   Pap Smear  07/07/2022*   Zoster (Shingles) Vaccine (1 of 2) 09/11/2022*   Hemoglobin A1C  10/07/2022   Yearly kidney health urinalysis for diabetes  11/20/2022   Eye exam for diabetics  02/15/2023   Yearly kidney function blood test for diabetes  04/08/2023   Complete foot exam   05/06/2023   Medicare Annual Wellness Visit  06/13/2023   Mammogram  09/05/2023   Colon Cancer Screening  04/02/2032   Flu Shot  Completed   Hepatitis C Screening: USPSTF Recommendation to screen - Ages 18-79 yo.  Completed   HIV Screening  Completed   HPV Vaccine  Aged Out  *Topic was postponed. The date shown is not the original due date.    Advanced directives: End of life planning; Advance aging; Advanced directives discussed.  Copy of current HCPOA/Living Will requested.    Conditions/risks identified: none new  Next appointment: Follow up in one year for your annual wellness visit.   Preventive Care 40-64 Years, Female Preventive care refers to lifestyle choices and visits with your health care provider that can promote health and wellness. What does preventive care include? A yearly physical exam. This is also called an annual well check. Dental exams once or twice a year. Routine eye exams. Ask your health care provider how often you should have your eyes checked. Personal lifestyle  choices, including: Daily care of your teeth and gums. Regular physical activity. Eating a healthy diet. Avoiding tobacco and drug use. Limiting alcohol use. Practicing safe sex. Taking low-dose aspirin daily starting at age 70. Taking vitamin and mineral supplements as recommended by your health care provider. What happens during an annual well check? The services and screenings done by your health care provider during your annual well check will depend on your age, overall health, lifestyle risk factors, and family history of disease. Counseling  Your health care provider may ask you questions about your: Alcohol use. Tobacco use. Drug use. Emotional well-being. Home and relationship well-being. Sexual activity. Eating habits. Work and work Statistician. Method of birth control. Menstrual cycle. Pregnancy history. Screening  You may have the following tests or measurements: Height, weight, and BMI. Blood pressure. Lipid and cholesterol levels. These may be checked every 5 years, or more frequently if you are over 64 years old. Skin check. Lung cancer screening. You may have this screening every year starting at age 18 if you have a 30-pack-year history of smoking and currently smoke or have quit within the past 15 years. Fecal occult blood test (FOBT) of the stool. You may have this test every year starting at age 49. Flexible sigmoidoscopy or colonoscopy. You may have a sigmoidoscopy every 5 years or a colonoscopy every 10 years starting at age  50. Hepatitis C blood test. Hepatitis B blood test. Sexually transmitted disease (STD) testing. Diabetes screening. This is done by checking your blood sugar (glucose) after you have not eaten for a while (fasting). You may have this done every 1-3 years. Mammogram. This may be done every 1-2 years. Talk to your health care provider about when you should start having regular mammograms. This may depend on whether you have a family  history of breast cancer. BRCA-related cancer screening. This may be done if you have a family history of breast, ovarian, tubal, or peritoneal cancers. Pelvic exam and Pap test. This may be done every 3 years starting at age 66. Starting at age 59, this may be done every 5 years if you have a Pap test in combination with an HPV test. Bone density scan. This is done to screen for osteoporosis. You may have this scan if you are at high risk for osteoporosis. Discuss your test results, treatment options, and if necessary, the need for more tests with your health care provider. Vaccines  Your health care provider may recommend certain vaccines, such as: Influenza vaccine. This is recommended every year. Tetanus, diphtheria, and acellular pertussis (Tdap, Td) vaccine. You may need a Td booster every 10 years. Zoster vaccine. You may need this after age 15. Pneumococcal 13-valent conjugate (PCV13) vaccine. You may need this if you have certain conditions and were not previously vaccinated. Pneumococcal polysaccharide (PPSV23) vaccine. You may need one or two doses if you smoke cigarettes or if you have certain conditions. Talk to your health care provider about which screenings and vaccines you need and how often you need them. This information is not intended to replace advice given to you by your health care provider. Make sure you discuss any questions you have with your health care provider. Document Released: 07/20/2015 Document Revised: 03/12/2016 Document Reviewed: 04/24/2015 Elsevier Interactive Patient Education  2017 [place] Prevention in the Home Falls can cause injuries. They can happen to people of all ages. There are many things you can do to make your home safe and to help prevent falls. What can I do on the outside of my home? Regularly fix the edges of walkways and driveways and fix any cracks. Remove anything that might make you trip as you walk through a door, such  as a raised step or threshold. Trim any bushes or trees on the path to your home. Use bright outdoor lighting. Clear any walking paths of anything that might make someone trip, such as rocks or tools. Regularly check to see if handrails are loose or broken. Make sure that both sides of any steps have handrails. Any raised decks and porches should have guardrails on the edges. Have any leaves, snow, or ice cleared regularly. Use sand or salt on walking paths during winter. Clean up any spills in your garage right away. This includes oil or grease spills. What can I do in the bathroom? Use night lights. Install grab bars by the toilet and in the tub and shower. Do not use towel bars as grab bars. Use non-skid mats or decals in the tub or shower. If you need to sit down in the shower, use a plastic, non-slip stool. Keep the floor dry. Clean up any water that spills on the floor as soon as it happens. Remove soap buildup in the tub or shower regularly. Attach bath mats securely with double-sided non-slip rug tape. Do not have throw rugs and other  things on the floor that can make you trip. What can I do in the bedroom? Use night lights. Make sure that you have a light by your bed that is easy to reach. Do not use any sheets or blankets that are too big for your bed. They should not hang down onto the floor. Have a firm chair that has side arms. You can use this for support while you get dressed. Do not have throw rugs and other things on the floor that can make you trip. What can I do in the kitchen? Clean up any spills right away. Avoid walking on wet floors. Keep items that you use a lot in easy-to-reach places. If you need to reach something above you, use a strong step stool that has a grab bar. Keep electrical cords out of the way. Do not use floor polish or wax that makes floors slippery. If you must use wax, use non-skid floor wax. Do not have throw rugs and other things on the  floor that can make you trip. What can I do with my stairs? Do not leave any items on the stairs. Make sure that there are handrails on both sides of the stairs and use them. Fix handrails that are broken or loose. Make sure that handrails are as long as the stairways. Check any carpeting to make sure that it is firmly attached to the stairs. Fix any carpet that is loose or worn. Avoid having throw rugs at the top or bottom of the stairs. If you do have throw rugs, attach them to the floor with carpet tape. Make sure that you have a light switch at the top of the stairs and the bottom of the stairs. If you do not have them, ask someone to add them for you. What else can I do to help prevent falls? Wear shoes that: Do not have high heels. Have rubber bottoms. Are comfortable and fit you well. Are closed at the toe. Do not wear sandals. If you use a stepladder: Make sure that it is fully opened. Do not climb a closed stepladder. Make sure that both sides of the stepladder are locked into place. Ask someone to hold it for you, if possible. Clearly mark and make sure that you can see: Any grab bars or handrails. First and last steps. Where the edge of each step is. Use tools that help you move around (mobility aids) if they are needed. These include: Canes. Walkers. Scooters. Crutches. Turn on the lights when you go into a dark area. Replace any light bulbs as soon as they burn out. Set up your furniture so you have a clear path. Avoid moving your furniture around. If any of your floors are uneven, fix them. If there are any pets around you, be aware of where they are. Review your medicines with your doctor. Some medicines can make you feel dizzy. This can increase your chance of falling. Ask your doctor what other things that you can do to help prevent falls. This information is not intended to replace advice given to you by your health care provider. Make sure you discuss any questions  you have with your health care provider. Document Released: 04/19/2009 Document Revised: 11/29/2015 Document Reviewed: 07/28/2014 Elsevier Interactive Patient Education  2017 [employer].

## 2022-06-12 NOTE — Progress Notes (Signed)
Subjective:   Laura Reed is a 61 y.o. female who presents for Medicare Annual (Subsequent) preventive examination.  Review of Systems    No ROS.  Medicare Wellness Virtual Visit.  Visual/audio telehealth visit, UTA vital signs.   See social history for additional risk factors.   Cardiac Risk Factors include: advanced age (>21mn, >>57women);diabetes mellitus     Objective:    Today's Vitals   06/12/22 1411  Weight: 144 lb (65.3 kg)  Height: '5\' 7"'$  (1.702 m)   Body mass index is 22.55 kg/m.     06/12/2022    2:18 PM 04/02/2022    9:11 AM 12/04/2020    9:16 AM 09/27/2020    2:37 PM 03/19/2020    8:11 AM 03/08/2020    1:42 PM 12/06/2019    9:05 AM  Advanced Directives  Does Patient Have a Medical Advance Directive? Yes Yes Yes No Yes Yes Yes  Type of AParamedicof AOak HallLiving will H[place]onLiving will H[place]Living will  Healthcare Power of AP[place]Living will  Does patient want to make changes to medical advance directive? No - Patient declined    No - Patient declined  No - Patient declined  Copy of [place]in Chart? No - copy requested    No - copy requested  Yes - validated most recent copy scanned in chart (See row information)  Would patient like information on creating a medical advance directive?    No - Patient declined       Current Medications (verified) Outpatient Encounter Medications as of 06/12/2022  Medication Sig   acetaminophen (TYLENOL) 500 MG tablet Take 1,000 mg by mouth every 6 (six) hours as needed for mild pain or headache.    ALPRAZolam (XANAX) 0.5 MG tablet TAKE 1 TABLET(0.5 MG) BY MOUTH DAILY AS NEEDED FOR ANXIETY   amLODipine (NORVASC) 2.5 MG tablet TAKE 1 TABLET(2.5 MG) BY MOUTH DAILY   aspirin 81 MG chewable tablet Chew 81 mg by mouth at bedtime.    atorvastatin (LIPITOR) 20 MG tablet TAKE 1 TABLET(20 MG) BY MOUTH DAILY    BIOTIN PO Take 1 tablet by mouth daily.   Cholecalciferol (VITAMIN D3) 2000 UNITS TABS Take 2,000 Units by mouth daily after supper.    clopidogrel (PLAVIX) 75 MG tablet TAKE 1 TABLET(75 MG) BY MOUTH DAILY   Continuous Blood Gluc Sensor (FREESTYLE LIBRE 2 SENSOR) MISC Use to check sugar at least 4 times daily   empagliflozin (JARDIANCE) 25 MG TABS tablet TAKE 1 TABLET(25 MG) BY MOUTH DAILY BEFORE AND BREAKFAST   gabapentin (NEURONTIN) 800 MG tablet TAKE 1 TABLET(800 MG) BY MOUTH THREE TIMES DAILY   glucose blood test strip Use you check blood sugars up to four times daily.   insulin degludec (TRESIBA FLEXTOUCH) 100 UNIT/ML FlexTouch Pen Inject 16 Units into the skin daily. (Patient taking differently: Inject 20 Units into the skin daily.)   insulin lispro (HUMALOG KWIKPEN) 100 UNIT/ML KwikPen ADMINISTER 10 UNITS UNDER THE SKIN THREE TIMES DAILY BEFORE MEALS (Patient taking differently: ADMINISTER 10 UNITS UNDER THE SKIN THREE TIMES DAILY BEFORE MEALS PRN)   Insulin Pen Needle (PEN NEEDLES) 32G X 4 MM MISC Use to take insulin daily   losartan (COZAAR) 100 MG tablet Take 1 tablet (100 mg total) by mouth daily.   Multiple Vitamin (MULTIVITAMIN) tablet Take 1 tablet by mouth daily.   multivitamin-lutein (OCUVITE-LUTEIN) CAPS capsule Take 1 capsule  by mouth daily.   triamcinolone ointment (KENALOG) 0.5 % Apply 1 Application topically 2 (two) times daily.   No facility-administered encounter medications on file as of 06/12/2022.    Allergies (verified) Patient has no known allergies.   History: Past Medical History:  Diagnosis Date   Absence of kidney    left   Anxiety    Arthritis    Bladder cancer (HCC)    CHF (congestive heart failure) (HCC)    Complication of anesthesia    BP HAS  RUN LOW AFTER SURGERY-LUNGS FILLED UP WITH FLUID AFTER  LEG STENT SURGERY    Coronary artery disease    Diabetes mellitus    Family history of adverse reaction to anesthesia    Sister - PONV   GERD  (gastroesophageal reflux disease)    OCC TUMS   Heart murmur    Hemorrhoid    History of methicillin resistant staphylococcus aureus (MRSA) 2007   Hypertension    Neuropathy    PVD (peripheral vascular disease) (HCC)    Thyroid nodule    right   Urothelial carcinoma of kidney ([place]) 10/31/2014   INVASIVE UROTHELIAL CARCINOMA, LOW GRADE. T1, Nx.   Wears dentures    full upper and lower   Past Surgical History:  Procedure Laterality Date   AMPUTATION TOE     right (4th and 5th); left (great toe, 3rd)   AMPUTATION TOE Right 07/16/2018   Procedure: AMPUTATION TOE/MPJ right 2nd;  Surgeon: Sharlotte Alamo, DPM;  Location: [facility];  Service: Podiatry;  Laterality: Right;   ARTERIAL BYPASS SURGRY  2009, 2013 x 2   right leg , done in [place] Right 01/2014   Dr Delana Meyer   CATARACT EXTRACTION W/PHACO Right 12/14/2014   Procedure: CATARACT EXTRACTION PHACO AND INTRAOCULAR LENS PLACEMENT ([place]);  Surgeon: Lyla Glassing, MD;  Location: [facility];  Service: Ophthalmology;  Laterality: Right;  [place]   00:38.6              AP        7.1                   CDE  2.76   CATARACT EXTRACTION W/PHACO Left 12/06/2019   Procedure: CATARACT EXTRACTION PHACO AND INTRAOCULAR LENS PLACEMENT ([place]) LEFT DIABETIC;  Surgeon: Birder Robson, MD;  Location: [place];  Service: Ophthalmology;  Laterality: Left;  9.08 1:06.4   CESAREAN SECTION     CHOLECYSTECTOMY  03-03-12   Porcelain gallbladder, gallstones,  [place]   COLONOSCOPY W/ BIOPSIES  04/28/2012   Hyperplastic rectal polyps.   COLONOSCOPY WITH PROPOFOL N/A 04/02/2022   Procedure: COLONOSCOPY WITH PROPOFOL;  Surgeon: Robert Bellow, MD;  Location: [facility];  Service: Endoscopy;  Laterality: N/A;   CORONARY ARTERY BYPASS GRAFT  2009   3 vessel   CYSTOSCOPY W/ RETROGRADES Right 09/01/2016   Procedure: CYSTOSCOPY WITH RETROGRADE PYELOGRAM;  Surgeon: Hollice Espy, MD;  Location: [facility];   Service: Urology;  Laterality: Right;   CYSTOSCOPY W/ RETROGRADES Bilateral 03/19/2020   Procedure: CYSTOSCOPY WITH RETROGRADE PYELOGRAM;  Surgeon: Hollice Espy, MD;  Location: [facility];  Service: Urology;  Laterality: Bilateral;   CYSTOSCOPY WITH BIOPSY N/A 03/19/2020   Procedure: CYSTOSCOPY WITH BIOPSY;  Surgeon: Hollice Espy, MD;  Location: [facility];  Service: Urology;  Laterality: N/A;   EYE SURGERY     HERNIA REPAIR  10-31-14   ventral, retro-rectus atrium mesh   IRRIGATION AND  DEBRIDEMENT FOOT Left 01/18/2019   Procedure: IRRIGATION AND DEBRIDEMENT FOOT;  Surgeon: Sharlotte Alamo, DPM;  Location: [facility];  Service: Podiatry;  Laterality: Left;   LOWER EXTREMITY ANGIOGRAPHY Left 12/10/2016   Procedure: Lower Extremity Angiography;  Surgeon: Katha Cabal, MD;  Location: [place] [facility];  Service: Cardiovascular;  Laterality: Left;   LOWER EXTREMITY ANGIOGRAPHY Left 02/02/2018   Procedure: LOWER EXTREMITY ANGIOGRAPHY;  Surgeon: Katha Cabal, MD;  Location: [place] [facility];  Service: Cardiovascular;  Laterality: Left;   LOWER EXTREMITY ANGIOGRAPHY Left 05/05/2018   Procedure: LOWER EXTREMITY ANGIOGRAPHY;  Surgeon: Katha Cabal, MD;  [place] [facility];  Service: Cardiovascular;  Laterality: Left;   LOWER EXTREMITY ANGIOGRAPHY Left 12/04/2020   Procedure: LOWER EXTREMITY ANGIOGRAPHY with Intervention;  Surgeon: Katha Cabal, MD;  Location: [place] [facility];  Service: Cardiovascular;  Laterality: Left;   NEPHRECTOMY Left 10-31-14   PERIPHERAL VASCULAR CATHETERIZATION Left 05/01/2015   Procedure: Lower Extremity Angiography;  Surgeon: Katha Cabal, MD;  Location: [place] [facility];  Service: Cardiovascular;  Laterality: Left;   PERIPHERAL VASCULAR CATHETERIZATION  05/01/2015   Procedure: Lower Extremity Intervention;  Surgeon: Katha Cabal, MD;  Location: [place] [facility];  Service: Cardiovascular;;   PERIPHERAL VASCULAR  CATHETERIZATION Left 02/20/2015   Procedure: Pelvic Angiography;  Surgeon: Katha Cabal, MD;  Location: [place] [facility];  Service: Cardiovascular;  Laterality: Left;   TRANSURETHRAL RESECTION OF BLADDER TUMOR WITH MITOMYCIN-C N/A 09/01/2016   Procedure: TRANSURETHRAL RESECTION OF BLADDER TUMOR WITH MITOMYCIN-C;  Surgeon: Hollice Espy, MD;  Location: [facility];  Service: Urology;  Laterality: N/A;   Family History  Problem Relation Age of Onset   Cancer Mother 74       Lung Cancer   Cancer Father 48       Lung Ca   Diabetes Son    Breast cancer Maternal Grandmother    Kidney cancer Neg Hx    Bladder Cancer Neg Hx    Prostate cancer Neg Hx    Social History   Socioeconomic History   Marital status: Legally Separated    Spouse name: Not on file   Number of children: 2   Years of education: 12   Highest education level: 12th grade  Occupational History   Occupation: Disabled  Tobacco Use   Smoking status: Former    Packs/day: 2.00    Years: 35.00    Total pack years: 70.00    Types: Cigarettes    Quit date: 03/29/2013    Years since quitting: 9.2   Smokeless tobacco: Never  Vaping Use   Vaping Use: Former  Substance and Sexual Activity   Alcohol use: Not Currently    Alcohol/week: 0.0 standard drinks of alcohol    Comment: LAST DRINK 2009   Drug use: Not Currently    Types: Cocaine    Comment: last used in 2007   Sexual activity: Not Currently  Other Topics Concern   Not on file  Social History Narrative   Not on file   Social Determinants of Health   Financial Resource Strain: Low Risk  (06/12/2022)   Overall Financial Resource Strain (CARDIA)    Difficulty of Paying Living Expenses: Not hard at all  Food Insecurity: No Food Insecurity (06/12/2022)   Hunger Vital Sign    Worried About Running Out of Food in the Last Year: Never true    Ran Out of Food in the Last Year: Never true  Transportation Needs: No Transportation Needs (06/12/2022)   PRAPARE -  Hydrologist (Medical): No    Lack of Transportation (Non-Medical): No  Physical Activity: Inactive (06/10/2021)   Exercise Vital Sign    Days of Exercise per Week: 0 days    Minutes of Exercise per Session: 0 min  Stress: No Stress Concern Present (06/12/2022)   [place]    Feeling of Stress : Only a little  Social Connections: Moderately Isolated (06/12/2022)   Social Connection and Isolation Panel [NHANES]    Frequency of Communication with Friends and Family: More than three times a week    Frequency of Social Gatherings with Friends and Family: More than three times a week    Attends Religious Services: 1 to 4 times per year    Active Member of [employer] or Organizations: No    Attends Archivist Meetings: Never    Marital Status: Separated    Tobacco Counseling Counseling given: Not Answered   Clinical Intake:  Pre-visit preparation completed: Yes       Nutrition Risk Assessment: Has the patient had any N/V/D within the last 2 months?  No  Does the patient have any non-healing wounds?  No  Has the patient had any unintentional weight loss or weight gain?  No   Diabetes: Is the patient diabetic?  Yes  If diabetic, was a CBG obtained today?  No  Did the patient bring in their glucometer from home?  No  How often do you monitor your CBG's? daily.   Financial Strains and Diabetes Management: Are you having any financial strains with the device, your supplies or your medication? No .  Does the patient want to be seen by Chronic Care Management for management of their diabetes?  No  Would the patient like to be referred to a Nutritionist or for Diabetic Management?  No    Diabetes: Yes (Followed by PCP)  How often do you need to have someone help you when you read instructions, pamphlets, or other written materials from your doctor or pharmacy?: 1 -  Never    Interpreter Needed?: No    Activities of Daily Living    06/12/2022    2:16 PM  In your present state of health, do you have any difficulty performing the following activities:  Hearing? 0  Vision? 0  Difficulty concentrating or making decisions? 0  Walking or climbing stairs? 0  Dressing or bathing? 0  Doing errands, shopping? 0  Preparing Food and eating ? N  Using the Toilet? N  In the past six months, have you accidently leaked urine? N  Do you have problems with loss of bowel control? N  Managing your Medications? N  Managing your Finances? N  Housekeeping or managing your Housekeeping? N    Patient Care Team: [name], MD as PCP - General (Internal Medicine) Bary Castilla [place] Gleason, MD (General Surgery) [name], MD (Internal Medicine)  Indicate any recent Medical Services you may have received from other than [facility] providers in the past year (date may be approximate).     Assessment:   This is a routine wellness examination for Laura Reed.  I connected with  Laura Reed on 06/12/22 by a audio enabled telemedicine application and verified that I am speaking with the correct person using two identifiers.  Patient Location: Home  Provider Location: Office/Clinic  I discussed the limitations of evaluation and  management by telemedicine. The patient expressed understanding and agreed to proceed.   Hearing/Vision screen Hearing Screening - Comments:: Patient is able to hear conversational tones without difficulty.  No issues reported. Vision Screening - Comments:: Follow[facility] by [facility] Wears corrective lenses Cataract extraction, bilateral Injections every 6 weeks They have regular follow up with the ophthalmologist   Dietary issues and exercise activities discussed: Current Exercise Habits: Home exercise routine, Type of exercise: walking, Intensity: Mild Diabetic diet; tries to monitor carb intake Good water intake   Goals  Addressed             This Visit's Progress    DIET - REDUCE SUGAR INTAKE       A1C within range Increase physical activity        Depression Screen    06/12/2022    2:18 PM 04/07/2022   10:50 AM 11/19/2021    3:02 PM 10/16/2021   10:06 AM 08/02/2021    3:38 PM 06/10/2021   12:47 PM 03/28/2021    9:44 AM  PHQ 2/9 Scores  PHQ - 2 Score 0 0 0 0  0 0  Exception Documentation     Patient refusal      Fall Risk    06/12/2022    2:18 PM 04/07/2022   10:50 AM 11/19/2021    3:02 PM 10/16/2021   10:06 AM 08/02/2021    3:37 PM  [place] in the past year? 0 0 0 0 0  Number falls in past yr: 0   0 0  Injury with Fall?    0 0  Risk for fall due to : No Fall Risks No Fall Risks No Fall Risks No Fall Risks No Fall Risks  Follow up Falls evaluation completed Falls evaluation completed Falls evaluation completed Falls evaluation completed Falls evaluation completed    [place]: Home free of loose throw rugs in walkways, pet beds, electrical cords, etc? Yes  Adequate lighting in your home to reduce risk of falls? Yes   ASSISTIVE DEVICES UTILIZED TO PREVENT FALLS: Life alert? No  Use of a cane, walker or w/c? No   TIMED UP AND GO: Was the test performed? No .   Cognitive Function:    05/21/2018   11:55 AM 03/27/2017   10:53 AM  MMSE - Mini Mental State Exam  Orientation to time 5 5  Orientation to Place 5 5  Registration 3 3  Attention/ Calculation 5 5  Recall 3 3  Language- name 2 objects 2 2  Language- repeat 1 1  Language- follow 3 step command 3 3  Language- read & follow direction 1 1  Write a sentence 1 1  Copy design 1 1  Total score 30 30        06/12/2022    2:20 PM 06/07/2020   12:58 PM 05/26/2019    9:47 AM  6CIT Screen  What Year? 0 points 0 points 0 points  What month? 0 points 0 points 0 points  What time? 0 points  0 points  Count back from 20 0 points  0 points  Months in reverse 0 points 0 points 0 points   Repeat phrase 0 points    Total Score 0 points      Immunizations Immunization History  Administered Date(s) Administered   Influenza Split 03/16/2012   Influenza,inj,Quad PF,6+ Mos 04/06/2013, 03/14/2014, 03/27/2017, 03/25/2018, 05/11/2019, 03/30/2020, 03/28/2021, 04/07/2022   Influenza-Unspecified 04/02/2015, 03/14/2016  Moderna Covid-19 Vaccine Bivalent Booster 80yr & up 05/13/2021   Moderna Sars-Covid-2 Vaccination 09/28/2019, 10/26/2019, 05/29/2020, 10/29/2020   Pneumococcal Conjugate-13 06/15/2014   Pneumococcal Polysaccharide-23 07/07/2010   Tdap 03/16/2012   TDAP status: Due, Education has been provided regarding the importance of this vaccine. Advised may receive this vaccine at local pharmacy or Health Dept. Aware to provide a copy of the vaccination record if obtained from local pharmacy or Health Dept. Verbalized acceptance and understanding.  Covid-19 vaccine status: Completed vaccines x5.  Shingrix Completed?: No.    Education has been provided regarding the importance of this vaccine. Patient has been advised to call insurance company to determine out of pocket expense if they have not yet received this vaccine. Advised may also receive vaccine at local pharmacy or Health Dept. Verbalized acceptance and understanding.  Screening Tests Health Maintenance  Topic Date Due   Lung Cancer Screening  Never done   DTaP/Tdap/Td (2 - Td or Tdap) 03/16/2022   COVID-19 Vaccine (6 - 2023-24 season) 06/28/2022 (Originally 03/07/2022)   PAP SMEAR-Modifier  07/07/2022 (Originally 12/05/2021)   Zoster Vaccines- Shingrix (1 of 2) 09/11/2022 (Originally 09/13/1979)   HEMOGLOBIN A1C  10/07/2022   Diabetic kidney evaluation - Urine ACR  11/20/2022   OPHTHALMOLOGY EXAM  02/15/2023   Diabetic kidney evaluation - GFR measurement  04/08/2023   FOOT EXAM  05/06/2023   Medicare Annual Wellness (AWV)  06/13/2023   MAMMOGRAM  09/05/2023   COLONOSCOPY (Pts 45-441yrInsurance coverage will need  to be confirmed)  04/02/2032   INFLUENZA VACCINE  Completed   Hepatitis C Screening  Completed   HIV Screening  Completed   HPV VACCINES  Aged Out   Health Maintenance Health Maintenance Due  Topic Date Due   Lung Cancer Screening  Never done   DTaP/Tdap/Td (2 - Td or Tdap) 03/16/2022   Lung Cancer Screening: difficulty placing this visit due to system. Deferred ordering at upcoming visit in 2 weeks.   Hepatitis C Screening: Completed 2016.  Vision Screening: Recommended annual ophthalmology exams for early detection of glaucoma and other disorders of the eye.  Dental Screening: Recommended annual dental exams for proper oral hygiene.  Community Resource Referral / Chronic Care Management: CRR required this visit?  No   CCM required this visit?  No      Plan:     I have personally reviewed and noted the following in the patient's chart:   Medical and social history Use of alcohol, tobacco or illicit drugs  Current medications and supplements including opioid prescriptions. Patient is not currently taking opioid prescriptions. Functional ability and status Nutritional status Physical activity Advanced directives List of other physicians Hospitalizations, surgeries, and ER visits in previous 12 months Vitals Screenings to include cognitive, depression, and falls Referrals and appointments  In addition, I have reviewed and discussed with patient certain preventive protocols, quality metrics, and best practice recommendations. A written personalized care plan for preventive services as well as general preventive health recommendations were provided to patient.     DeLeta JunglingLPN   [id number]

## 2022-07-01 ENCOUNTER — Telehealth: Payer: Self-pay | Admitting: Internal Medicine

## 2022-07-01 NOTE — Telephone Encounter (Signed)
Sample placed up front for pick up.

## 2022-07-01 NOTE — Telephone Encounter (Signed)
Pt called wanting to know if we have any libre samples

## 2022-07-04 DIAGNOSIS — E1169 Type 2 diabetes mellitus with other specified complication: Secondary | ICD-10-CM | POA: Diagnosis not present

## 2022-07-08 DIAGNOSIS — E782 Mixed hyperlipidemia: Secondary | ICD-10-CM | POA: Diagnosis not present

## 2022-07-08 DIAGNOSIS — T50905A Adverse effect of unspecified drugs, medicaments and biological substances, initial encounter: Secondary | ICD-10-CM | POA: Diagnosis not present

## 2022-07-08 DIAGNOSIS — I25708 Atherosclerosis of coronary artery bypass graft(s), unspecified, with other forms of angina pectoris: Secondary | ICD-10-CM | POA: Diagnosis not present

## 2022-07-08 DIAGNOSIS — L97909 Non-pressure chronic ulcer of unspecified part of unspecified lower leg with unspecified severity: Secondary | ICD-10-CM | POA: Diagnosis not present

## 2022-07-08 DIAGNOSIS — R001 Bradycardia, unspecified: Secondary | ICD-10-CM | POA: Diagnosis not present

## 2022-07-08 DIAGNOSIS — I70211 Atherosclerosis of native arteries of extremities with intermittent claudication, right leg: Secondary | ICD-10-CM | POA: Diagnosis not present

## 2022-07-08 DIAGNOSIS — I70299 Other atherosclerosis of native arteries of extremities, unspecified extremity: Secondary | ICD-10-CM | POA: Diagnosis not present

## 2022-07-08 DIAGNOSIS — I10 Essential (primary) hypertension: Secondary | ICD-10-CM | POA: Diagnosis not present

## 2022-07-09 ENCOUNTER — Ambulatory Visit: Payer: Medicare Other | Admitting: Internal Medicine

## 2022-07-14 ENCOUNTER — Other Ambulatory Visit: Payer: Self-pay | Admitting: Internal Medicine

## 2022-07-15 ENCOUNTER — Other Ambulatory Visit: Payer: Medicare Other | Admitting: Urology

## 2022-07-15 DIAGNOSIS — Z8551 Personal history of malignant neoplasm of bladder: Secondary | ICD-10-CM

## 2022-07-21 DIAGNOSIS — E113512 Type 2 diabetes mellitus with proliferative diabetic retinopathy with macular edema, left eye: Secondary | ICD-10-CM | POA: Diagnosis not present

## 2022-08-08 ENCOUNTER — Ambulatory Visit: Payer: Medicare Other | Admitting: Internal Medicine

## 2022-08-12 ENCOUNTER — Encounter: Payer: Self-pay | Admitting: Urology

## 2022-08-12 ENCOUNTER — Ambulatory Visit (INDEPENDENT_AMBULATORY_CARE_PROVIDER_SITE_OTHER): Payer: 59 | Admitting: Urology

## 2022-08-12 VITALS — BP 97/60 | HR 80 | Ht 67.0 in | Wt 140.0 lb

## 2022-08-12 DIAGNOSIS — N3289 Other specified disorders of bladder: Secondary | ICD-10-CM | POA: Diagnosis not present

## 2022-08-12 DIAGNOSIS — Z8551 Personal history of malignant neoplasm of bladder: Secondary | ICD-10-CM

## 2022-08-12 LAB — MICROSCOPIC EXAMINATION: WBC, UA: >30 /hpf — AB (ref 0–5)

## 2022-08-12 LAB — URINALYSIS, COMPLETE
Bilirubin, UA: NEGATIVE
Ketones, UA: NEGATIVE
Nitrite, UA: NEGATIVE
Protein,UA: NEGATIVE
RBC, UA: NEGATIVE
Specific Gravity, UA: <1.005 — ABNORMAL LOW (ref 1.005–1.030)
Urobilinogen, Ur: 0.2 mg/dL (ref 0.2–1.0)
pH, UA: 5 (ref 5.0–7.5)

## 2022-08-12 NOTE — Progress Notes (Signed)
   11/06/2021  CC:  Chief Complaint  Patient presents with   Cysto    HPI: Laura Reed is a 62 y.o.female with a personal history of pT1N0 left upper tract urothelial carcinoma, who presents today for a cystoscopy.    She has a history of left upper tract transitional cell carcinoma/ bladder cancer recurrence. She is s/p left robotic nephroureterectomy and combined ventral hernia repair with Dr. Bary Castilla on 10/31/2014.   Recent Surveillance: 02/2015 - cysto NED; 05/2015 cysto NED, 08/2015- cysto NED, 11/2015- NED, CT Urogram 11/12/15 negative, poor quality of delayed phase.; 5/2-17- cysto with mild erythema following recent UTI; 02/2016 NED; 08/2016 Lg Ta TCC recurrence,  1 cm, multifocal.  RTG neg.  08/2016- TURBT for bladder 1 cm recurrence near bladder neck and left UO, multifocal.  R RTG negative. Mitomycin.  LgTa. 10/2016- Induction BCG x 6 08/2017- Maintenance BCG x 3 02/2019- Maintenance BCG x 3 9/21- Cysto with erythema on posterior bladder wall, bx c/w follicular cystitis, R RTG negative    She also has a solitary kidney. S/p left nephrectomy as above.    She has an extensive smoking history.     Vitals:   08/12/22 1439  BP: 97/60  Pulse: 80   NED. A&Ox3.   No respiratory distress   Abd soft, NT, ND Normal external genitalia with patent urethral meatus  Cystoscopy Procedure Note  Patient identification was confirmed, informed consent was obtained, and patient was prepped using Betadine solution.  Lidocaine jelly was administered per urethral meatus.    Procedure: - Flexible cystoscope introduced, without any difficulty.   - Thorough search of the bladder revealed:    normal urethral meatus    normal urothelium with some vascular changes associated with previous biopsies consistent with scar.  In addition to this she has a new area of erythema on the right lateral bladder wall beyond the trigone with what appears to be some overlying adherent mucus and/or debris with also  what appears to be a component of squamous metaplasia extending from the trigone    no stones    no ulcers     no tumors    no urethral polyps    no trabeculation  - Ureteral orifices were normal in position and appearance.  Post-Procedure: - Patient tolerated the procedure well  Assessment/ Plan:  1.History of Malignant neoplasm of urinary bladder/ history of upper tract TCC - Last recurrence in 2018  - Continue cystoscopy on a every 6 month interval.   2.  Bladder erythema -Changes on the right bladder wall, do not appear particularly consistent with malignant process, rather suspect inflammatory which she has had in the past -Urine cytology today, if this is negative we will manage this conservatively -If urine cytology is suspicious or positive, we will proceed with biopsy.  She is agreeable this plan. -Light of this, will continue cystoscopy on a q. 53-monthbasis rather than transitioning to a year to monitor this area   Cysto in 6 mo  AHollice Espy MD

## 2022-08-14 LAB — CYTOLOGY - NON PAP

## 2022-08-18 ENCOUNTER — Other Ambulatory Visit: Payer: Self-pay | Admitting: Internal Medicine

## 2022-08-18 DIAGNOSIS — Z1231 Encounter for screening mammogram for malignant neoplasm of breast: Secondary | ICD-10-CM

## 2022-09-04 DIAGNOSIS — B351 Tinea unguium: Secondary | ICD-10-CM | POA: Diagnosis not present

## 2022-09-04 DIAGNOSIS — E1140 Type 2 diabetes mellitus with diabetic neuropathy, unspecified: Secondary | ICD-10-CM | POA: Diagnosis not present

## 2022-09-04 DIAGNOSIS — Z794 Long term (current) use of insulin: Secondary | ICD-10-CM | POA: Diagnosis not present

## 2022-09-04 DIAGNOSIS — I739 Peripheral vascular disease, unspecified: Secondary | ICD-10-CM | POA: Diagnosis not present

## 2022-09-04 DIAGNOSIS — Z89421 Acquired absence of other right toe(s): Secondary | ICD-10-CM | POA: Diagnosis not present

## 2022-09-04 DIAGNOSIS — Z89422 Acquired absence of other left toe(s): Secondary | ICD-10-CM | POA: Diagnosis not present

## 2022-09-08 ENCOUNTER — Ambulatory Visit (INDEPENDENT_AMBULATORY_CARE_PROVIDER_SITE_OTHER): Payer: 59 | Admitting: Internal Medicine

## 2022-09-08 ENCOUNTER — Encounter: Payer: Self-pay | Admitting: Internal Medicine

## 2022-09-08 VITALS — BP 142/74 | HR 72 | Temp 98.6°F | Ht 67.0 in | Wt 146.4 lb

## 2022-09-08 DIAGNOSIS — Z89429 Acquired absence of other toe(s), unspecified side: Secondary | ICD-10-CM | POA: Diagnosis not present

## 2022-09-08 DIAGNOSIS — Z79899 Other long term (current) drug therapy: Secondary | ICD-10-CM

## 2022-09-08 DIAGNOSIS — Z794 Long term (current) use of insulin: Secondary | ICD-10-CM | POA: Diagnosis not present

## 2022-09-08 DIAGNOSIS — I10 Essential (primary) hypertension: Secondary | ICD-10-CM | POA: Diagnosis not present

## 2022-09-08 DIAGNOSIS — E1169 Type 2 diabetes mellitus with other specified complication: Secondary | ICD-10-CM | POA: Diagnosis not present

## 2022-09-08 DIAGNOSIS — I739 Peripheral vascular disease, unspecified: Secondary | ICD-10-CM | POA: Diagnosis not present

## 2022-09-08 DIAGNOSIS — E785 Hyperlipidemia, unspecified: Secondary | ICD-10-CM

## 2022-09-08 DIAGNOSIS — Z87891 Personal history of nicotine dependence: Secondary | ICD-10-CM

## 2022-09-08 DIAGNOSIS — Z720 Tobacco use: Secondary | ICD-10-CM

## 2022-09-08 DIAGNOSIS — E1165 Type 2 diabetes mellitus with hyperglycemia: Secondary | ICD-10-CM | POA: Diagnosis not present

## 2022-09-08 DIAGNOSIS — R195 Other fecal abnormalities: Secondary | ICD-10-CM

## 2022-09-08 LAB — COMPREHENSIVE METABOLIC PANEL
ALT: 18 U/L (ref 0–35)
AST: 20 U/L (ref 0–37)
Albumin: 3.8 g/dL (ref 3.5–5.2)
Alkaline Phosphatase: 88 U/L (ref 39–117)
BUN: 23 mg/dL (ref 6–23)
CO2: 27 mEq/L (ref 19–32)
Calcium: 9 mg/dL (ref 8.4–10.5)
Chloride: 100 mEq/L (ref 96–112)
Creatinine, Ser: 0.91 mg/dL (ref 0.40–1.20)
GFR: 66.92 mL/min (ref >60.00)
Glucose, Bld: 132 mg/dL — ABNORMAL HIGH (ref 70–99)
Potassium: 4.6 mEq/L (ref 3.5–5.1)
Sodium: 134 mEq/L — ABNORMAL LOW (ref 135–145)
Total Bilirubin: 0.5 mg/dL (ref 0.2–1.2)
Total Protein: 6.6 g/dL (ref 6.0–8.3)

## 2022-09-08 LAB — TSH: TSH: 1 u[IU]/mL (ref 0.35–5.50)

## 2022-09-08 LAB — CBC WITH DIFFERENTIAL/PLATELET
Basophils Absolute: 0.1 10*3/uL (ref 0.0–0.1)
Basophils Relative: 0.9 % (ref 0.0–3.0)
Eosinophils Absolute: 0.1 10*3/uL (ref 0.0–0.7)
Eosinophils Relative: 0.7 % (ref 0.0–5.0)
HCT: 43.5 % (ref 36.0–46.0)
Hemoglobin: 15.1 g/dL — ABNORMAL HIGH (ref 12.0–15.0)
Lymphocytes Relative: 16.4 % (ref 12.0–46.0)
Lymphs Abs: 1.3 10*3/uL (ref 0.7–4.0)
MCHC: 34.8 g/dL (ref 30.0–36.0)
MCV: 87.3 fl (ref 78.0–100.0)
Monocytes Absolute: 0.7 10*3/uL (ref 0.1–1.0)
Monocytes Relative: 8.4 % (ref 3.0–12.0)
Neutro Abs: 5.7 10*3/uL (ref 1.4–7.7)
Neutrophils Relative %: 73.6 % (ref 43.0–77.0)
Platelets: 246 10*3/uL (ref 150.0–400.0)
RBC: 4.98 Mil/uL (ref 3.87–5.11)
RDW: 13.6 % (ref 11.5–15.5)
WBC: 7.7 10*3/uL (ref 4.0–10.5)

## 2022-09-08 LAB — MICROALBUMIN / CREATININE URINE RATIO
Creatinine,U: 20.2 mg/dL
Microalb Creat Ratio: 27.8 mg/g (ref 0.0–30.0)
Microalb, Ur: 5.6 mg/dL — ABNORMAL HIGH (ref 0.0–1.9)

## 2022-09-08 LAB — LIPID PANEL
Cholesterol: 100 mg/dL (ref 0–200)
HDL: 46.7 mg/dL (ref >39.00)
LDL Cholesterol: 41 mg/dL (ref 0–99)
NonHDL: 53.52
Total CHOL/HDL Ratio: 2
Triglycerides: 64 mg/dL (ref 0.0–149.0)
VLDL: 12.8 mg/dL (ref 0.0–40.0)

## 2022-09-08 LAB — LDL CHOLESTEROL, DIRECT: Direct LDL: 43 mg/dL

## 2022-09-08 LAB — HEMOGLOBIN A1C: Hgb A1c MFr Bld: 7.5 % — ABNORMAL HIGH (ref 4.6–6.5)

## 2022-09-08 MED ORDER — TRESIBA FLEXTOUCH 100 UNIT/ML ~~LOC~~ SOPN: 20.0000 [IU] | PEN_INJECTOR | Freq: Every day | SUBCUTANEOUS | 2 refills | Status: DC

## 2022-09-08 MED ORDER — LOSARTAN POTASSIUM 100 MG PO TABS: 100.0000 mg | ORAL_TABLET | Freq: Every day | ORAL | 1 refills | Status: DC

## 2022-09-08 MED ORDER — EMPAGLIFLOZIN 25 MG PO TABS: ORAL_TABLET | ORAL | 2 refills | Status: DC

## 2022-09-08 MED ORDER — AMLODIPINE BESYLATE 2.5 MG PO TABS: ORAL_TABLET | ORAL | 1 refills | Status: DC

## 2022-09-08 NOTE — Progress Notes (Signed)
Subjective:  Patient ID: Laura Reed, female    DOB: [birth year]  Age: 62 y.o. MRN: [id number]  CC: The primary encounter diagnosis was Hyperlipidemia associated with type 2 diabetes mellitus ([place]). Diagnoses of Primary hypertension, Type 2 diabetes mellitus with hyperglycemia, with long-term current use of insulin ([name]), Long-term use of high-risk medication, Tobacco abuse disorder, History of amputation of toe ([place]), Positive colorectal cancer screening using Cologuard test, PVD (peripheral vascular disease) ([place]), and History of tobacco abuse were also pertinent to this visit.   HPI RAGAN BLOOMQUIST presents for  Chief Complaint  Patient presents with   Medical Management of Chronic Issues    3 month follow up on diabetes   1) TYPE 2 DM:   She  feels generally well,  But is not  exercising regularly or trying to lose weight. Checking  blood sugars  3  4 to 5 times daily, pre and post meals,  using he Freestyle Libre 2 monitor.   [name] has been apprehensive about using SSI due to history of hypoglyemic events.  Taking   medications as directed. Following a carbohydrate modified diet 5  days per week. No change in chronic  numbness, bof extremities. Appetite is good.  TAKING 20 UNITS OF TRESIBA AND USING SLIDING SCALE OF 2-3 UNITS . ALSO TAKING JARDIANCE   2) TOBACCO ABUSE:  NEEDS LUNG CA SCREENING. REFERRAL TO PULMONARY CLINIC MADE    3) HTN Patient is taking h amlodipine , losaRTAN  as prescribed and notes no adverse effects.  Home BP readings have been done about once per week and are  generally < 130/80 .  She is avoiding added salt in her diet and walking regularly about 3 times per week for exercise  .   4) PAD;  has seen Dew recently.  Taking plavix.  No claudication symptoms currently   Outpatient Medications Prior to Visit  Medication Sig Dispense Refill   acetaminophen (TYLENOL) 500 MG tablet Take 1,000 mg by mouth every 6 (six) hours as needed for mild pain or headache.       ALPRAZolam (XANAX) 0.5 MG tablet TAKE 1 TABLET(0.5 MG) BY MOUTH DAILY AS NEEDED FOR ANXIETY 30 tablet 5   aspirin 81 MG chewable tablet Chew 81 mg by mouth at bedtime.      atorvastatin (LIPITOR) 20 MG tablet TAKE 1 TABLET(20 MG) BY MOUTH DAILY 90 tablet 3   BIOTIN PO Take 1 tablet by mouth daily.     Cholecalciferol (VITAMIN D3) 2000 UNITS TABS Take 2,000 Units by mouth daily after supper.      clopidogrel (PLAVIX) 75 MG tablet TAKE 1 TABLET(75 MG) BY MOUTH DAILY 90 tablet 1   Continuous Blood Gluc Sensor (FREESTYLE LIBRE 2 SENSOR) MISC Use to check sugar at least 4 times daily 2 each 2   gabapentin (NEURONTIN) 800 MG tablet TAKE 1 TABLET(800 MG) BY MOUTH THREE TIMES DAILY 270 tablet 3   glucose blood test strip Use you check blood sugars up to four times daily. 100 each 12   insulin lispro (HUMALOG KWIKPEN) 100 UNIT/ML KwikPen ADMINISTER 10 UNITS UNDER THE SKIN THREE TIMES DAILY BEFORE MEALS (Patient taking differently: ADMINISTER 10 UNITS UNDER THE SKIN THREE TIMES DAILY BEFORE MEALS PRN) 15 mL 0   Insulin Pen Needle (PEN NEEDLES) 32G X 4 MM MISC Use to take insulin daily 100 each 3   Multiple Vitamin (MULTIVITAMIN) tablet Take 1 tablet by mouth daily.     multivitamin-lutein (OCUVITE-LUTEIN) CAPS capsule  Take 1 capsule by mouth daily.     triamcinolone ointment (KENALOG) 0.5 % Apply 1 Application topically 2 (two) times daily. 30 g 0   amLODipine (NORVASC) 2.5 MG tablet TAKE 1 TABLET(2.5 MG) BY MOUTH DAILY 90 tablet 1   empagliflozin (JARDIANCE) 25 MG TABS tablet TAKE 1 TABLET(25 MG) BY MOUTH DAILY BEFORE AND BREAKFAST 90 tablet 2   insulin degludec (TRESIBA FLEXTOUCH) 100 UNIT/ML FlexTouch Pen Inject 16 Units into the skin daily. (Patient taking differently: Inject 20 Units into the skin daily.) 30 mL 2   losartan (COZAAR) 100 MG tablet Take 1 tablet (100 mg total) by mouth daily. 90 tablet 1   losartan (COZAAR) 50 MG tablet Take 50 mg by mouth daily. (Patient not taking: Reported on 09/08/2022)      No facility-administered medications prior to visit.    Review of Systems;  Patient denies headache, fevers, malaise, unintentional weight loss, skin rash, eye pain, sinus congestion and sinus pain, sore throat, dysphagia,  hemoptysis , cough, dyspnea, wheezing, chest pain, palpitations, orthopnea, edema, abdominal pain, nausea, melena, diarrhea, constipation, flank pain, dysuria, hematuria, urinary  Frequency, nocturia, numbness, tingling, seizures,  Focal weakness, Loss of consciousness,  Tremor, insomnia, depression, anxiety, and suicidal ideation.      Objective:  BP (!) 142/74   Pulse 72   Temp 98.6 F (37 C) (Oral)   Ht '5\' 7"'$  (1.702 m)   Wt 146 lb 6.4 oz (66.4 kg)   SpO2 99%   BMI 22.93 kg/m   BP Readings from Last 3 Encounters:  09/08/22 (!) 142/74  08/12/22 97/60  05/22/22 134/70    Wt Readings from Last 3 Encounters:  09/08/22 146 lb 6.4 oz (66.4 kg)  08/12/22 140 lb (63.5 kg)  06/12/22 144 lb (65.3 kg)    Physical Exam Vitals reviewed.  Constitutional:      General: She is not in acute distress.    Appearance: Normal appearance. She is normal weight. She is not ill-appearing, toxic-appearing or diaphoretic.  HENT:     Head: Normocephalic.  Eyes:     General: No scleral icterus.       Right eye: No discharge.        Left eye: No discharge.     Conjunctiva/sclera: Conjunctivae normal.  Cardiovascular:     Rate and Rhythm: Normal rate and regular rhythm.     Heart sounds: Normal heart sounds.  Pulmonary:     Effort: Pulmonary effort is normal. No respiratory distress.     Breath sounds: Normal breath sounds.  Musculoskeletal:        General: Normal range of motion.  Skin:    General: Skin is warm and dry.  Neurological:     General: No focal deficit present.     Mental Status: She is alert and oriented to person, place, and time. Mental status is at baseline.  Psychiatric:        Mood and Affect: Mood normal.        Behavior: Behavior normal.         Thought Content: Thought content normal.        Judgment: Judgment normal.     Lab Results  Component Value Date   HGBA1C 7.5 (H) 09/08/2022   HGBA1C 7.9 (H) 04/07/2022   HGBA1C 7.7 (A) 11/19/2021    Lab Results  Component Value Date   CREATININE 0.91 09/08/2022   CREATININE 0.93 04/07/2022   CREATININE 1.06 11/19/2021    Lab Results  Component Value  Date   WBC 7.7 09/08/2022   HGB 15.1 (H) 09/08/2022   HCT 43.5 09/08/2022   PLT 246.0 09/08/2022   GLUCOSE 132 (H) 09/08/2022   CHOL 100 09/08/2022   TRIG 64.0 09/08/2022   HDL 46.70 09/08/2022   LDLDIRECT 43.0 09/08/2022   LDLCALC 41 09/08/2022   ALT 18 09/08/2022   AST 20 09/08/2022   NA 134 (L) 09/08/2022   K 4.6 09/08/2022   CL 100 09/08/2022   CREATININE 0.91 09/08/2022   BUN 23 09/08/2022   CO2 27 09/08/2022   TSH 1.00 09/08/2022   INR 0.9 10/17/2014   HGBA1C 7.5 (H) 09/08/2022   MICROALBUR 5.6 (H) 09/08/2022    No results found.  Assessment & Plan:  .Hyperlipidemia associated with type 2 diabetes mellitus ([place]) Assessment & Plan: Loss of glycemic control noted. In January,  she has a history of  severe hypoglycemia leading to LOC  in the past . She is currently taking  20 units of Tresiba , 25 mg Jardiance,  and not using SSI .  Advised to use 3 units for cbg > 160,  6 units for cbg > 160 and starchy meal.  hISTORICLALY Well controlled on current statin therapy.   REPEAT labs ordered   Lab Results  Component Value Date   CHOL 116 04/07/2022   HDL 60.30 04/07/2022   LDLCALC 43 04/07/2022   LDLDIRECT 48.0 04/07/2022   TRIG 63.0 04/07/2022   CHOLHDL 2 04/07/2022   Lab Results  Component Value Date   ALT 16 04/07/2022   AST 15 04/07/2022   ALKPHOS 92 04/07/2022   BILITOT 0.5 04/07/2022           Orders: -     Lipid panel -     LDL cholesterol, direct  Primary hypertension Assessment & Plan: she reports compliance with medication regimen  but has an elevated reading today in office.   He has been asked to check his BP at work and  submit readings for evaluation. Renal function will be checked today   Orders: -     amLODIPine Besylate; TAKE 1 TABLET(2.5 MG) BY MOUTH DAILY  Dispense: 90 tablet; Refill: 1 -     Losartan Potassium; Take 1 tablet (100 mg total) by mouth daily.  Dispense: 90 tablet; Refill: 1 -     Comprehensive metabolic panel -     Microalbumin / creatinine urine ratio  Type 2 diabetes mellitus with hyperglycemia, with long-term current use of insulin ([facility]) -     Empagliflozin; TAKE 1 TABLET(25 MG) BY MOUTH DAILY BEFORE AND BREAKFAST  Dispense: 90 tablet; Refill: 2 -     Tyler Aas FlexTouch; Inject 20 Units into the skin daily.  Dispense: 15 mL; Refill: 2 -     Hemoglobin A1c -     Comprehensive metabolic panel -     Microalbumin / creatinine urine ratio  Long-term use of high-risk medication -     CBC with Differential/Platelet -     TSH  Tobacco abuse disorder -     Ambulatory referral to Pulmonology  History of amputation of toe ([place]) Assessment & Plan: SECONDARY TO PAD COMPLICATED BY DIABETIC NEUROPATHY.  DME FOR DIABETIC SHOES SIGNED    Positive colorectal cancer screening using Cologuard test Assessment & Plan: 6 MM TA REMOVED BY BYRNETT IN SEPT  2023    PVD (peripheral vascular disease) ([place]) Assessment & Plan: She maintains regular follow up with vascular surgery to surveillance of carotids and iliac disease  with prior surgical interventions .  She is taking aspirin a, plavix, and atorvastatin   History of tobacco abuse Assessment & Plan: Referring to pulmonary clinic for lung CA screening       Follow-up: Return in about 3 months (around 12/09/2022) for follow up diabetes.   [name], MD

## 2022-09-08 NOTE — Assessment & Plan Note (Signed)
SECONDARY TO PAD COMPLICATED BY DIABETIC NEUROPATHY.  DME FOR DIABETIC SHOES SIGNED

## 2022-09-08 NOTE — Assessment & Plan Note (Signed)
6 MM TA REMOVED BY BYRNETT IN SEPT  2023

## 2022-09-08 NOTE — Assessment & Plan Note (Addendum)
Loss of glycemic control noted. In January,  she has a history of  severe hypoglycemia leading to LOC  in the past . She is currently taking  20 units of Tresiba , 25 mg Jardiance,  and not using SSI .  Advised to use 3 units for cbg > 160,  6 units for cbg > 160 and starchy meal.  hISTORICLALY Well controlled on current statin therapy.   REPEAT labs ordered   Lab Results  Component Value Date   CHOL 116 04/07/2022   HDL 60.30 04/07/2022   LDLCALC 43 04/07/2022   LDLDIRECT 48.0 04/07/2022   TRIG 63.0 04/07/2022   CHOLHDL 2 04/07/2022   Lab Results  Component Value Date   ALT 16 04/07/2022   AST 15 04/07/2022   ALKPHOS 92 04/07/2022   BILITOT 0.5 04/07/2022

## 2022-09-08 NOTE — Patient Instructions (Addendum)
   IF YOUR PREMEAL SUGAR Is < 160  AND YOU ARE HAVING A LOW CARB MEAL.  DO NOT TAKE ANY MEALTIME INSULIN   IF PREMEAL SUGAR IS < 160 AND YOU ARE HAVING  STARCH ,  TAKE 3 UNITS OF MEALTIME INSULIN    IF PRE MEAL SUGAR I S > 160 AND YOU ARE HAVING POTATOES OR ANOTHER STARCH,  ADD  3 MORE UNITS  (6 TOTAL)   BRING THE READER TO NEXT VISIT AND GIVE TO JESSICA WHEN YOU RETURN

## 2022-09-08 NOTE — Assessment & Plan Note (Signed)
she reports compliance with medication regimen  but has an elevated reading today in office.  He has been asked to check his BP at work and  submit readings for evaluation. Renal function will be checked today

## 2022-09-08 NOTE — Assessment & Plan Note (Signed)
Referring to pulmonary clinic for lung CA screening

## 2022-09-08 NOTE — Assessment & Plan Note (Signed)
She maintains regular follow up with vascular surgery to surveillance of carotids and iliac disease with prior surgical interventions .  She is taking aspirin a, plavix, and atorvastatin

## 2022-09-16 ENCOUNTER — Ambulatory Visit
Admission: RE | Admit: 2022-09-16 | Discharge: 2022-09-16 | Disposition: A | Discharge status: Home / Self Care | Payer: 59 | Source: Ambulatory Visit | Attending: Internal Medicine | Admitting: Internal Medicine

## 2022-09-16 DIAGNOSIS — Z1231 Encounter for screening mammogram for malignant neoplasm of breast: Secondary | ICD-10-CM | POA: Diagnosis not present

## 2022-09-17 ENCOUNTER — Other Ambulatory Visit: Payer: Self-pay | Admitting: Internal Medicine

## 2022-09-17 NOTE — Telephone Encounter (Signed)
  Last refill: 02/07/2022  LOV: 09/08/22 NOV: 12/11/22

## 2022-10-09 ENCOUNTER — Telehealth: Payer: Self-pay | Admitting: Internal Medicine

## 2022-10-09 DIAGNOSIS — E113512 Type 2 diabetes mellitus with proliferative diabetic retinopathy with macular edema, left eye: Secondary | ICD-10-CM | POA: Diagnosis not present

## 2022-10-09 NOTE — Telephone Encounter (Signed)
Patient returned office phone call. 

## 2022-10-09 NOTE — Telephone Encounter (Signed)
LVM to call back.

## 2022-10-09 NOTE — Telephone Encounter (Signed)
Pt called stating she need a libre sensor sample because she is waiting on a refill from [facility]

## 2022-10-10 ENCOUNTER — Telehealth: Payer: Self-pay

## 2022-10-10 NOTE — Telephone Encounter (Signed)
Toni Amend called from [facility] to confirm that we received an order she faxed on 10/07/2022.

## 2022-10-13 ENCOUNTER — Other Ambulatory Visit: Payer: Self-pay | Admitting: Internal Medicine

## 2022-10-13 DIAGNOSIS — I10 Essential (primary) hypertension: Secondary | ICD-10-CM

## 2022-10-13 NOTE — Telephone Encounter (Signed)
LMTCB

## 2022-10-14 NOTE — Telephone Encounter (Signed)
I have placed form for signature in quick sign folder.

## 2022-10-14 NOTE — Telephone Encounter (Signed)
Pt is aware and will pick up sample today.

## 2022-10-16 NOTE — Telephone Encounter (Signed)
faxed

## 2022-10-17 DIAGNOSIS — E1169 Type 2 diabetes mellitus with other specified complication: Secondary | ICD-10-CM | POA: Diagnosis not present

## 2022-10-21 ENCOUNTER — Other Ambulatory Visit: Payer: Self-pay

## 2022-10-21 MED ORDER — GABAPENTIN 800 MG PO TABS: ORAL_TABLET | ORAL | 3 refills | Status: DC

## 2022-10-30 DIAGNOSIS — E1140 Type 2 diabetes mellitus with diabetic neuropathy, unspecified: Secondary | ICD-10-CM | POA: Diagnosis not present

## 2022-11-17 ENCOUNTER — Ambulatory Visit (INDEPENDENT_AMBULATORY_CARE_PROVIDER_SITE_OTHER): Payer: Medicare Other | Admitting: Vascular Surgery

## 2022-11-17 ENCOUNTER — Encounter (INDEPENDENT_AMBULATORY_CARE_PROVIDER_SITE_OTHER): Payer: Medicare Other

## 2022-12-04 DIAGNOSIS — E113512 Type 2 diabetes mellitus with proliferative diabetic retinopathy with macular edema, left eye: Secondary | ICD-10-CM | POA: Diagnosis not present

## 2022-12-11 ENCOUNTER — Encounter: Payer: 59 | Admitting: Internal Medicine

## 2022-12-23 ENCOUNTER — Other Ambulatory Visit (INDEPENDENT_AMBULATORY_CARE_PROVIDER_SITE_OTHER): Payer: Self-pay | Admitting: Vascular Surgery

## 2022-12-23 DIAGNOSIS — I739 Peripheral vascular disease, unspecified: Secondary | ICD-10-CM

## 2022-12-23 DIAGNOSIS — I6523 Occlusion and stenosis of bilateral carotid arteries: Secondary | ICD-10-CM

## 2022-12-24 DIAGNOSIS — Z794 Long term (current) use of insulin: Secondary | ICD-10-CM | POA: Diagnosis not present

## 2022-12-24 DIAGNOSIS — E1140 Type 2 diabetes mellitus with diabetic neuropathy, unspecified: Secondary | ICD-10-CM | POA: Diagnosis not present

## 2022-12-24 DIAGNOSIS — B351 Tinea unguium: Secondary | ICD-10-CM | POA: Diagnosis not present

## 2022-12-24 DIAGNOSIS — L851 Acquired keratosis [keratoderma] palmaris et plantaris: Secondary | ICD-10-CM | POA: Diagnosis not present

## 2022-12-25 ENCOUNTER — Ambulatory Visit (INDEPENDENT_AMBULATORY_CARE_PROVIDER_SITE_OTHER): Payer: 59 | Admitting: Vascular Surgery

## 2022-12-25 ENCOUNTER — Ambulatory Visit (INDEPENDENT_AMBULATORY_CARE_PROVIDER_SITE_OTHER): Payer: 59

## 2022-12-25 DIAGNOSIS — I6523 Occlusion and stenosis of bilateral carotid arteries: Secondary | ICD-10-CM

## 2022-12-25 DIAGNOSIS — Z9889 Other specified postprocedural states: Secondary | ICD-10-CM

## 2022-12-25 DIAGNOSIS — I739 Peripheral vascular disease, unspecified: Secondary | ICD-10-CM

## 2022-12-29 ENCOUNTER — Encounter (INDEPENDENT_AMBULATORY_CARE_PROVIDER_SITE_OTHER): Payer: Self-pay | Admitting: Vascular Surgery

## 2022-12-29 ENCOUNTER — Ambulatory Visit (INDEPENDENT_AMBULATORY_CARE_PROVIDER_SITE_OTHER): Payer: 59 | Admitting: Vascular Surgery

## 2022-12-29 VITALS — BP 108/66 | HR 77 | Resp 16 | Wt 141.0 lb

## 2022-12-29 DIAGNOSIS — E785 Hyperlipidemia, unspecified: Secondary | ICD-10-CM

## 2022-12-29 DIAGNOSIS — I6523 Occlusion and stenosis of bilateral carotid arteries: Secondary | ICD-10-CM | POA: Diagnosis not present

## 2022-12-29 DIAGNOSIS — E1169 Type 2 diabetes mellitus with other specified complication: Secondary | ICD-10-CM

## 2022-12-29 DIAGNOSIS — I25118 Atherosclerotic heart disease of native coronary artery with other forms of angina pectoris: Secondary | ICD-10-CM | POA: Diagnosis not present

## 2022-12-29 DIAGNOSIS — I70213 Atherosclerosis of native arteries of extremities with intermittent claudication, bilateral legs: Secondary | ICD-10-CM

## 2022-12-29 DIAGNOSIS — E1151 Type 2 diabetes mellitus with diabetic peripheral angiopathy without gangrene: Secondary | ICD-10-CM

## 2022-12-29 LAB — VAS US ABI WITH/WO TBI
Left ABI: 0.82
Right ABI: 0.84

## 2022-12-29 NOTE — Progress Notes (Signed)
MRN : [id number]  Laura Reed is a 62 y.o. ([birth year]) female who presents with chief complaint of check circulation.  History of Present Illness:   The patient returns to the office for followup and review status post angiogram with intervention on 12/04/2020.    Procedure: Percutaneous transluminal angioplasty and stent placement left popliteal arteries to 6 mm 2.   Percutaneous transluminal angioplasty left anterior tibial to 2.5 mm 3.   Mechanical thrombectomy using the Rota Rex catheter left popliteal artery. 4.   Mechanical thrombectomy using the penumbra CAT 6 device left anterior tibial artery.   The patient notes improvement in the lower extremity symptoms. No interval shortening of the patient's claudication distance or rest pain symptoms. Previous wounds have now healed.  No new ulcers or wounds have occurred since the last visit.   There have been no significant changes to the patient's overall health care.   The patient denies amaurosis fugax or recent TIA symptoms. There are no recent neurological changes noted. The patient denies history of DVT, PE or superficial thrombophlebitis. The patient denies recent episodes of angina or shortness of breath.    ABI's Rt=0.84 and Lt=0.82 (previous ABI's Rt=1.16 and Lt=1.10)  Previous duplex US of the left lower extremity arterial system shows patent bypass and stent with uniform velocities.  No significant changes compared to last visit   Carotid duplex today shows RICA 1-39% s/p CEA and LICA 60-79%.  Previous carotid duplex shows RICA 1-39% s/p CEA and LICA 40-59%.   No outpatient medications have been marked as taking for the 12/29/22 encounter (Appointment) with Gilda Crease, Latina Craver, MD.    Past Medical History:  Diagnosis Date   Absence of kidney    left   Anxiety    Arthritis    Bladder cancer (HCC)    CHF (congestive heart failure) (HCC)     Complication of anesthesia    BP HAS  RUN LOW AFTER SURGERY-LUNGS FILLED UP WITH FLUID AFTER  LEG STENT SURGERY    Coronary artery disease    Diabetes mellitus    Family history of adverse reaction to anesthesia    Sister - PONV   GERD (gastroesophageal reflux disease)    OCC TUMS   Heart murmur    Hemorrhoid    History of methicillin resistant staphylococcus aureus (MRSA) 2007   Hypertension    Neuropathy    PVD (peripheral vascular disease) (HCC)    Thyroid nodule    right   Urothelial carcinoma of kidney (HCC) 10/31/2014   INVASIVE UROTHELIAL CARCINOMA, LOW GRADE. T1, Nx.   Wears dentures    full upper and lower    Past Surgical History:  Procedure Laterality Date   AMPUTATION TOE     right (4th and 5th); left (great toe, 3rd)   AMPUTATION TOE Right 07/16/2018   Procedure: AMPUTATION TOE/MPJ right 2nd;  Surgeon: Linus Galas, DPM;  Location: [facility];  Service: Podiatry;  Laterality: Right;   ARTERIAL BYPASS SURGRY  2009, 2013 x 2   right leg , done in [facility]   CARDIAC CATHETERIZATION  CAROTID ENDARTERECTOMY Right 01/2014   Dr Gilda Crease   CATARACT EXTRACTION W/PHACO Right 12/14/2014   Procedure: CATARACT EXTRACTION PHACO AND INTRAOCULAR LENS PLACEMENT (IOC);  Surgeon: Lia Hopping, MD;  Location: [facility];  Service: Ophthalmology;  Laterality: Right;  [place]   00:38.6              AP        7.1                   CDE  2.76   CATARACT EXTRACTION W/PHACO Left 12/06/2019   Procedure: CATARACT EXTRACTION PHACO AND INTRAOCULAR LENS PLACEMENT (IOC) LEFT DIABETIC;  Surgeon: Galen Manila, MD;  Location: [facility];  Service: Ophthalmology;  Laterality: Left;  9.08 1:06.4   CESAREAN SECTION     CHOLECYSTECTOMY  03-03-12   Porcelain gallbladder, gallstones,  [place]   COLONOSCOPY W/ BIOPSIES  04/28/2012   Hyperplastic rectal polyps.   COLONOSCOPY WITH PROPOFOL N/A 04/02/2022   Procedure: COLONOSCOPY WITH PROPOFOL;  Surgeon: Earline Mayotte, MD;  Location: [facility];   Service: Endoscopy;  Laterality: N/A;   CORONARY ARTERY BYPASS GRAFT  2009   3 vessel   CYSTOSCOPY W/ RETROGRADES Right 09/01/2016   Procedure: CYSTOSCOPY WITH RETROGRADE PYELOGRAM;  Surgeon: Vanna Scotland, MD;  Location: [facility];  Service: Urology;  Laterality: Right;   CYSTOSCOPY W/ RETROGRADES Bilateral 03/19/2020   Procedure: CYSTOSCOPY WITH RETROGRADE PYELOGRAM;  Surgeon: Vanna Scotland, MD;  Location: [facility];  Service: Urology;  Laterality: Bilateral;   CYSTOSCOPY WITH BIOPSY N/A 03/19/2020   Procedure: CYSTOSCOPY WITH BIOPSY;  Surgeon: Vanna Scotland, MD;  Location: [facility];  Service: Urology;  Laterality: N/A;   EYE SURGERY     HERNIA REPAIR  10-31-14   ventral, retro-rectus atrium mesh   IRRIGATION AND DEBRIDEMENT FOOT Left 01/18/2019   Procedure: IRRIGATION AND DEBRIDEMENT FOOT;  Surgeon: Linus Galas, DPM;  Location: [facility];  Service: Podiatry;  Laterality: Left;   LOWER EXTREMITY ANGIOGRAPHY Left 12/10/2016   Procedure: Lower Extremity Angiography;  Surgeon: Renford Dills, MD;  Location: [facility];  Service: Cardiovascular;  Laterality: Left;   LOWER EXTREMITY ANGIOGRAPHY Left 02/02/2018   Procedure: LOWER EXTREMITY ANGIOGRAPHY;  Surgeon: Renford Dills, MD;  Location: [facility];  Service: Cardiovascular;  Laterality: Left;   LOWER EXTREMITY ANGIOGRAPHY Left 05/05/2018   Procedure: LOWER EXTREMITY ANGIOGRAPHY;  Surgeon: Renford Dills, MD;  Location: [facility];  Service: Cardiovascular;  Laterality: Left;   LOWER EXTREMITY ANGIOGRAPHY Left 12/04/2020   Procedure: LOWER EXTREMITY ANGIOGRAPHY with Intervention;  Surgeon: Renford Dills, MD;  Location: [facility];  Service: Cardiovascular;  Laterality: Left;   NEPHRECTOMY Left 10-31-14   PERIPHERAL VASCULAR CATHETERIZATION Left 05/01/2015   Procedure: Lower Extremity Angiography;  Surgeon: Renford Dills, MD;  Location: [facility];  Service: Cardiovascular;   Laterality: Left;   PERIPHERAL VASCULAR CATHETERIZATION  05/01/2015   Procedure: Lower Extremity Intervention;  Surgeon: Renford Dills, MD;  Location: [facility];  Service: Cardiovascular;;   PERIPHERAL VASCULAR CATHETERIZATION Left 02/20/2015   Procedure: Pelvic Angiography;  Surgeon: Renford Dills, MD;  Location: [facility];  Service: Cardiovascular;  Laterality: Left;   TRANSURETHRAL RESECTION OF BLADDER TUMOR WITH MITOMYCIN-C N/A 09/01/2016   Procedure: TRANSURETHRAL RESECTION OF BLADDER TUMOR WITH MITOMYCIN-C;  Surgeon: Vanna Scotland, MD;  Location: [facility];  Service: Urology;  Laterality: N/A;    Social History Social History  Tobacco Use   Smoking status: Former    Packs/day: 2.00    Years: 35.00    Additional pack years: 0.00    Total pack years: 70.00    Types: Cigarettes    Quit date: 03/29/2013    Years since quitting: 9.7   Smokeless tobacco: Never  Vaping Use   Vaping Use: Former  Substance Use Topics   Alcohol use: Not Currently    Alcohol/week: 0.0 standard drinks of alcohol    Comment: LAST DRINK 2009   Drug use: Not Currently    Types: Cocaine    Comment: last used in 2007    Family History Family History  Problem Relation Age of Onset   Cancer Mother 20       Lung Cancer   Cancer Father 81       Lung Ca   Diabetes Son    Breast cancer Maternal Grandmother    Kidney cancer Neg Hx    Bladder Cancer Neg Hx    Prostate cancer Neg Hx     No Known Allergies   REVIEW OF SYSTEMS (Negative unless checked)  Constitutional: [] Weight loss  [] Fever  [] Chills Cardiac: [] Chest pain   [] Chest pressure   [] Palpitations   [] Shortness of breath when laying flat   [] Shortness of breath with exertion. Vascular:  [x] Pain in legs with walking   [] Pain in legs at rest  [] History of DVT   [] Phlebitis   [] Swelling in legs   [] Varicose veins   [] Non-healing ulcers Pulmonary:   [] Uses home oxygen   [] Productive cough   [] Hemoptysis   [] Wheeze   [] COPD   [] Asthma Neurologic:  [] Dizziness   [] Seizures   [] History of stroke   [] History of TIA  [] Aphasia   [] Vissual changes   [] Weakness or numbness in arm   [] Weakness or numbness in leg Musculoskeletal:   [] Joint swelling   [] Joint pain   [] Low back pain Hematologic:  [] Easy bruising  [] Easy bleeding   [] Hypercoagulable state   [] Anemic Gastrointestinal:  [] Diarrhea   [] Vomiting  [] Gastroesophageal reflux/heartburn   [] Difficulty swallowing. Genitourinary:  [] Chronic kidney disease   [] Difficult urination  [] Frequent urination   [] Blood in urine Skin:  [] Rashes   [] Ulcers  Psychological:  [] History of anxiety   []  History of major depression.  Physical Examination  There were no vitals filed for this visit. There is no height or weight on file to calculate BMI. Gen: WD/WN, NAD Head: [place]/AT, No temporalis wasting.  Ear/Nose/Throat: Hearing grossly intact, nares w/o erythema or drainage Eyes: PER, EOMI, sclera nonicteric.  Neck: Supple, no masses.  No bruit or JVD.  Pulmonary:  Good air movement, no audible wheezing, no use of accessory muscles.  Cardiac: RRR, normal S1, S2, no Murmurs. Vascular:  mild trophic changes, no open wounds Vessel Right Left  Radial Palpable Palpable  PT Not Palpable Not Palpable  DP Not Palpable Not Palpable  Gastrointestinal: soft, non-distended. No guarding/no peritoneal signs.  Musculoskeletal: M/S 5/5 throughout.  No visible deformity.  Neurologic: CN 2-12 intact. Pain and light touch intact in extremities.  Symmetrical.  Speech is fluent. Motor exam as listed above. Psychiatric: Judgment intact, Mood & affect appropriate for pt's clinical situation. Dermatologic: No rashes or ulcers noted.  No changes consistent with cellulitis.   CBC Lab Results  Component Value Date   WBC 7.7 09/08/2022   HGB 15.1 (H) 09/08/2022   HCT 43.5 09/08/2022   MCV 87.3 09/08/2022   PLT 246.0 09/08/2022  BMET    Component Value Date/Time   NA 134 (L)  09/08/2022 1044   NA 136 08/02/2021 1629   NA 135 11/02/2014 0609   K 4.6 09/08/2022 1044   K 4.2 11/02/2014 0609   CL 100 09/08/2022 1044   CL 107 11/02/2014 0609   CO2 27 09/08/2022 1044   CO2 23 11/02/2014 0609   GLUCOSE 132 (H) 09/08/2022 1044   GLUCOSE 108 (H) 11/02/2014 0609   BUN 23 09/08/2022 1044   BUN 25 08/02/2021 1629   BUN 20 11/02/2014 0609   CREATININE 0.91 09/08/2022 1044   CREATININE 1.01 11/09/2015 1549   CREATININE 1.01 11/09/2015 1549   CALCIUM 9.0 09/08/2022 1044   CALCIUM 7.3 (L) 11/02/2014 0609   GFRNONAA >60 12/04/2020 0921   GFRNONAA 50 (L) 11/02/2014 0609   GFRAA >60 01/19/2019 0357   GFRAA 58 (L) 11/02/2014 0609   CrCl cannot be calculated (Patient's most recent lab result is older than the maximum 21 days allowed.).  COAG Lab Results  Component Value Date   INR 0.9 10/17/2014   INR 1.1 01/19/2014   INR 0.9 12/06/2013    Radiology No results found.   Assessment/Plan 1. Atherosclerosis of native artery of both lower extremities with intermittent claudication (HCC) Recommend:  Patient should undergo arterial duplex of the lower extremity  because there has been a significant deterioration in the patient's lower extremity symptoms.  The patient states they are having increased pain and a marked decrease in the distance that they can walk.  The risks and benefits as well as the alternatives were discussed in detail with the patient.  All questions were answered.  Patient agrees to proceed and understands this could be a prelude to angiography and intervention.  The patient will follow up with me in the office to review the studies.  - VAS [place] LOWER EXTREMITY ARTERIAL DUPLEX; Future  2. Bilateral carotid artery stenosis Recommend:  Given the patient's asymptomatic subcritical stenosis no further invasive testing or surgery at this time.  Carotid duplex today shows RICA 1-39% s/p CEA and LICA 60-79%.    Continue antiplatelet therapy as  prescribed Continue management of CAD, HTN and Hyperlipidemia Healthy heart diet,  encouraged exercise at least 4 times per week Follow up in 12 months with duplex ultrasound and physical exam   3. Coronary artery disease of native artery of native heart with stable angina pectoris (HCC) Continue cardiac and antihypertensive medications as already ordered and reviewed, no changes at this time.  Continue statin as ordered and reviewed, no changes at this time  Nitrates PRN for chest pain  4. Controlled type 2 DM with peripheral circulatory disorder (HCC) Continue hypoglycemic medications as already ordered, these medications have been reviewed and there are no changes at this time.  Hgb A1C to be monitored as already arranged by primary service  5. Hyperlipidemia associated with type 2 diabetes mellitus (HCC) Continue statin as ordered and reviewed, no changes at this time    [name], MD  12/29/2022 8:50 AM

## 2023-01-07 ENCOUNTER — Ambulatory Visit (INDEPENDENT_AMBULATORY_CARE_PROVIDER_SITE_OTHER): Payer: 59 | Admitting: Nurse Practitioner

## 2023-01-07 ENCOUNTER — Ambulatory Visit (INDEPENDENT_AMBULATORY_CARE_PROVIDER_SITE_OTHER): Payer: 59

## 2023-01-07 ENCOUNTER — Encounter (INDEPENDENT_AMBULATORY_CARE_PROVIDER_SITE_OTHER): Payer: Self-pay | Admitting: Nurse Practitioner

## 2023-01-07 VITALS — BP 132/69 | HR 63 | Resp 18 | Ht 67.0 in | Wt 142.6 lb

## 2023-01-07 DIAGNOSIS — I10 Essential (primary) hypertension: Secondary | ICD-10-CM | POA: Diagnosis not present

## 2023-01-07 DIAGNOSIS — E785 Hyperlipidemia, unspecified: Secondary | ICD-10-CM

## 2023-01-07 DIAGNOSIS — I70213 Atherosclerosis of native arteries of extremities with intermittent claudication, bilateral legs: Secondary | ICD-10-CM | POA: Diagnosis not present

## 2023-01-07 DIAGNOSIS — E1169 Type 2 diabetes mellitus with other specified complication: Secondary | ICD-10-CM | POA: Diagnosis not present

## 2023-01-07 DIAGNOSIS — E1151 Type 2 diabetes mellitus with diabetic peripheral angiopathy without gangrene: Secondary | ICD-10-CM

## 2023-01-08 NOTE — Progress Notes (Signed)
Subjective:    Patient ID: Laura Reed, female    DOB: 04/05/1961, 62 y.o.   MRN: [id number] Chief Complaint  Patient presents with   Follow-up    Follow up - pt conv BLE art dup    Laura Reed is a 62 year old female who returns today for noninvasive studies.  She has extensive peripheral arterial disease with multiple revascularizations.  She currently has a left to right femoral to femoral graft.  He has bilateral femoropopliteal bypass grafts with the left being and above-knee with Gore-Tex in the right being below-knee with saphenous vein.  At her most recent follow-up it was noted that she had a decrease in her ABIs.  She also notes that for the last several weeks she has been having some worsening claudication.  Will return today to further evaluate her perfusion to determine if intervention may be necessary.  Today she has a patent femorofemoral bypass graft.  Her left lower extremity the bypass graft is patent with monophasic waveforms.  The right appears to have a patent femoropopliteal bypass graft as well.  It is noted that on the right the right SFA distal to the anastomosis of the femorofemoral bypass graft has an occlusion however this is consistent with the previous bypass.  Denies rest pain.  She denies open wound or ulceration.  She also notes that she has not been extensively active and very sedentary.    Review of Systems  Cardiovascular:        Claudication  Skin:  Negative for wound.  All other systems reviewed and are negative.      Objective:   Physical Exam Vitals reviewed.  Cardiovascular:     Rate and Rhythm: Normal rate.     Pulses:          Dorsalis pedis pulses are detected w/ Doppler on the right side and detected w/ Doppler on the left side.       Posterior tibial pulses are detected w/ Doppler on the right side and detected w/ Doppler on the left side.  Pulmonary:     Effort: Pulmonary effort is normal.  Skin:    General: Skin is warm and dry.   Neurological:     Mental Status: She is alert and oriented to person, place, and time.     BP 132/69 (BP Location: Left Arm)   Pulse 63   Resp 18   Ht 5\' 7"  (1.702 m)   Wt 142 lb 9.6 oz (64.7 kg)   BMI 22.33 kg/m   Past Medical History:  Diagnosis Date   Absence of kidney    left   Anxiety    Arthritis    Bladder cancer (HCC)    CHF (congestive heart failure) (HCC)    Complication of anesthesia    BP HAS  RUN LOW AFTER SURGERY-LUNGS FILLED UP WITH FLUID AFTER  LEG STENT SURGERY    Coronary artery disease    Diabetes mellitus    Family history of adverse reaction to anesthesia    Sister - PONV   GERD (gastroesophageal reflux disease)    OCC TUMS   Heart murmur    Hemorrhoid    History of methicillin resistant staphylococcus aureus (MRSA) 2007   Hypertension    Neuropathy    PVD (peripheral vascular disease) (HCC)    Thyroid nodule    right   Urothelial carcinoma of kidney (HCC) 10/31/2014   INVASIVE UROTHELIAL CARCINOMA, LOW GRADE. T1, Nx.   Wears dentures  full upper and lower    Social History   Socioeconomic History   Marital status: Legally Separated    Spouse name: Not on file   Number of children: 2   Years of education: 12   Highest education level: 12th grade  Occupational History   Occupation: Disabled  Tobacco Use   Smoking status: Former    Packs/day: 2.00    Years: 35.00    Additional pack years: 0.00    Total pack years: 70.00    Types: Cigarettes    Quit date: 03/29/2013    Years since quitting: 9.7   Smokeless tobacco: Never  Vaping Use   Vaping Use: Former  Substance and Sexual Activity   Alcohol use: Not Currently    Alcohol/week: 0.0 standard drinks of alcohol    Comment: LAST DRINK 2009   Drug use: Not Currently    Types: Cocaine    Comment: last used in 2007   Sexual activity: Not Currently  Other Topics Concern   Not on file  Social History Narrative   Not on file   Social Determinants of Health   Financial  Resource Strain: Low Risk  (06/12/2022)   Overall Financial Resource Strain (CARDIA)    Difficulty of Paying Living Expenses: Not hard at all  Food Insecurity: No Food Insecurity (06/12/2022)   Hunger Vital Sign    Worried About Running Out of Food in the Last Year: Never true    Ran Out of Food in the Last Year: Never true  Transportation Needs: No Transportation Needs (06/12/2022)   PRAPARE - Administrator, Civil Service (Medical): No    Lack of Transportation (Non-Medical): No  Physical Activity: Inactive (06/10/2021)   Exercise Vital Sign    Days of Exercise per Week: 0 days    Minutes of Exercise per Session: 0 min  Stress: No Stress Concern Present (06/12/2022)   [employer] of Occupational Health - Occupational Stress Questionnaire    Feeling of Stress : Only a little  Social Connections: Moderately Isolated (06/12/2022)   Social Connection and Isolation Panel [NHANES]    Frequency of Communication with Friends and Family: More than three times a week    Frequency of Social Gatherings with Friends and Family: More than three times a week    Attends Religious Services: 1 to 4 times per year    Active Member of [employer] or Organizations: No    Attends Banker Meetings: Never    Marital Status: Separated  Intimate Partner Violence: Not At Risk (06/12/2022)   Humiliation, Afraid, Rape, and Kick questionnaire    Fear of Current or Ex-Partner: No    Emotionally Abused: No    Physically Abused: No    Sexually Abused: No    Past Surgical History:  Procedure Laterality Date   AMPUTATION TOE     right (4th and 5th); left (great toe, 3rd)   AMPUTATION TOE Right 07/16/2018   Procedure: AMPUTATION TOE/MPJ right 2nd;  Surgeon: Linus Galas, DPM;  Location: [facility];  Service: Podiatry;  Laterality: Right;   ARTERIAL BYPASS SURGRY  2009, 2013 x 2   right leg , done in [place]   CARDIAC CATHETERIZATION     CAROTID ENDARTERECTOMY Right 01/2014   Dr Gilda Crease    CATARACT EXTRACTION W/PHACO Right 12/14/2014   Procedure: CATARACT EXTRACTION PHACO AND INTRAOCULAR LENS PLACEMENT (IOC);  Surgeon: Lia Hopping, MD;  Location: [facility];  Service: Ophthalmology;  Laterality: Right;  [place]  00:38.6              AP        7.1                   CDE  2.76   CATARACT EXTRACTION W/PHACO Left 12/06/2019   Procedure: CATARACT EXTRACTION PHACO AND INTRAOCULAR LENS PLACEMENT (IOC) LEFT DIABETIC;  Surgeon: Galen Manila, MD;  Location: [facility];  Service: Ophthalmology;  Laterality: Left;  9.08 1:06.4   CESAREAN SECTION     CHOLECYSTECTOMY  03-03-12   Porcelain gallbladder, gallstones,  [place]   COLONOSCOPY W/ BIOPSIES  04/28/2012   Hyperplastic rectal polyps.   COLONOSCOPY WITH PROPOFOL N/A 04/02/2022   Procedure: COLONOSCOPY WITH PROPOFOL;  Surgeon: Earline Mayotte, MD;  Location: [facility];  Service: Endoscopy;  Laterality: N/A;   CORONARY ARTERY BYPASS GRAFT  2009   3 vessel   CYSTOSCOPY W/ RETROGRADES Right 09/01/2016   Procedure: CYSTOSCOPY WITH RETROGRADE PYELOGRAM;  Surgeon: Vanna Scotland, MD;  Location: [facility];  Service: Urology;  Laterality: Right;   CYSTOSCOPY W/ RETROGRADES Bilateral 03/19/2020   Procedure: CYSTOSCOPY WITH RETROGRADE PYELOGRAM;  Surgeon: Vanna Scotland, MD;  Location: [facility];  Service: Urology;  Laterality: Bilateral;   CYSTOSCOPY WITH BIOPSY N/A 03/19/2020   Procedure: CYSTOSCOPY WITH BIOPSY;  Surgeon: Vanna Scotland, MD;  Location: [facility];  Service: Urology;  Laterality: N/A;   EYE SURGERY     HERNIA REPAIR  10-31-14   ventral, retro-rectus atrium mesh   IRRIGATION AND DEBRIDEMENT FOOT Left 01/18/2019   Procedure: IRRIGATION AND DEBRIDEMENT FOOT;  Surgeon: Linus Galas, DPM;  Location: [facility];  Service: Podiatry;  Laterality: Left;   LOWER EXTREMITY ANGIOGRAPHY Left 12/10/2016   Procedure: Lower Extremity Angiography;  Surgeon: Renford Dills, MD;  Location: [facility];  Service: Cardiovascular;   Laterality: Left;   LOWER EXTREMITY ANGIOGRAPHY Left 02/02/2018   Procedure: LOWER EXTREMITY ANGIOGRAPHY;  Surgeon: Renford Dills, MD;  Location: [facility];  Service: Cardiovascular;  Laterality: Left;   LOWER EXTREMITY ANGIOGRAPHY Left 05/05/2018   Procedure: LOWER EXTREMITY ANGIOGRAPHY;  Surgeon: Renford Dills, MD;  Location: [facility];  Service: Cardiovascular;  Laterality: Left;   LOWER EXTREMITY ANGIOGRAPHY Left 12/04/2020   Procedure: LOWER EXTREMITY ANGIOGRAPHY with Intervention;  Surgeon: Renford Dills, MD;  Location: [facility];  Service: Cardiovascular;  Laterality: Left;   NEPHRECTOMY Left 10-31-14   PERIPHERAL VASCULAR CATHETERIZATION Left 05/01/2015   Procedure: Lower Extremity Angiography;  Surgeon: Renford Dills, MD;  Location: [facility];  Service: Cardiovascular;  Laterality: Left;   PERIPHERAL VASCULAR CATHETERIZATION  05/01/2015   Procedure: Lower Extremity Intervention;  Surgeon: Renford Dills, MD;  Location: [facility];  Service: Cardiovascular;;   PERIPHERAL VASCULAR CATHETERIZATION Left 02/20/2015   Procedure: Pelvic Angiography;  Surgeon: Renford Dills, MD;  Location: [facility];  Service: Cardiovascular;  Laterality: Left;   TRANSURETHRAL RESECTION OF BLADDER TUMOR WITH MITOMYCIN-C N/A 09/01/2016   Procedure: TRANSURETHRAL RESECTION OF BLADDER TUMOR WITH MITOMYCIN-C;  Surgeon: Vanna Scotland, MD;  Location: [facility];  Service: Urology;  Laterality: N/A;    Family History  Problem Relation Age of Onset   Cancer Mother 66       Lung Cancer   Cancer Father 77       Lung Ca   Diabetes Son    Breast cancer Maternal Grandmother    Kidney cancer Neg Hx  Bladder Cancer Neg Hx    Prostate cancer Neg Hx     No Known Allergies     Latest Ref Rng & Units 09/08/2022   10:44 AM 03/15/2020   12:08 PM 01/19/2019    3:57 AM  CBC  WBC 4.0 - 10.5 K/uL 7.7  9.2  8.9   Hemoglobin 12.0 - 15.0  g/dL 16.1  09.6  04.5   Hematocrit 36.0 - 46.0 % 43.5  37.4  37.1   Platelets 150.0 - 400.0 K/uL 246.0  274  289       CMP     Component Value Date/Time   NA 134 (L) 09/08/2022 1044   NA 136 08/02/2021 1629   NA 135 11/02/2014 0609   K 4.6 09/08/2022 1044   K 4.2 11/02/2014 0609   CL 100 09/08/2022 1044   CL 107 11/02/2014 0609   CO2 27 09/08/2022 1044   CO2 23 11/02/2014 0609   GLUCOSE 132 (H) 09/08/2022 1044   GLUCOSE 108 (H) 11/02/2014 0609   BUN 23 09/08/2022 1044   BUN 25 08/02/2021 1629   BUN 20 11/02/2014 0609   CREATININE 0.91 09/08/2022 1044   CREATININE 1.01 11/09/2015 1549   CREATININE 1.01 11/09/2015 1549   CALCIUM 9.0 09/08/2022 1044   CALCIUM 7.3 (L) 11/02/2014 0609   PROT 6.6 09/08/2022 1044   PROT 6.6 08/02/2021 1629   PROT 5.1 (L) 06/03/2013 0353   ALBUMIN 3.8 09/08/2022 1044   ALBUMIN 4.3 08/02/2021 1629   ALBUMIN 2.2 (L) 06/03/2013 0353   AST 20 09/08/2022 1044   AST 7 (L) 06/03/2013 0353   ALT 18 09/08/2022 1044   ALT 10 (L) 06/03/2013 0353   ALKPHOS 88 09/08/2022 1044   ALKPHOS 74 06/03/2013 0353   BILITOT 0.5 09/08/2022 1044   BILITOT 0.3 08/02/2021 1629   BILITOT 0.2 06/03/2013 0353   GFR 66.92 09/08/2022 1044   EGFR 64 08/02/2021 1629   GFRNONAA >60 12/04/2020 0921   GFRNONAA 50 (L) 11/02/2014 0609     VAS [place] ABI WITH/WO TBI  Result Date: 12/29/2022  LOWER EXTREMITY DOPPLER STUDY Patient Name:  Laura Reed  Date of Exam:   12/25/2022 Medical Rec #: [id number]        Accession #:    [id number] Date of Birth: 19-Jan-1961        Patient Gender: F Patient Age:   24 years Exam Location:  [facility] Procedure:      VAS [place] ABI WITH/WO TBI Referring Phys: --------------------------------------------------------------------------------  Indications: Peripheral artery disease. Other Factors: H/O right 4th & 5th and left 1st & 3rd toe amputations                12/03/2020 left pop thrombectomy and new stent.  Vascular Interventions: H/O  left-to-right fem-fem BPG with lower extremity                         stents & BPGs in [place];                         10/11/09: Left external iliac/common femoral artery PTA;                         07/25/11: Right profunda femoral to TP trunk BPG;  12/31/11: Revision of fem-fem BPG & redo of anastomosis;                         09/16/12: Fem-fem BPG, left external iliac, left common                         femoral & left profunda femoral artery PTAs;                         06/01/13: Left external iliac to profunda femoral BPG                         with revision of the fem-fem & left fem-AK popliteal BPG                         proximal anastomoses;                         08/02/13: Left distal SFA/popliteal artery PTA;                         02/20/15: Left external iliac artery PTA/stent with left                         common iliac artery PTA;                         12/10/16: Left external iliac & common femoral artery                         PTAs. Comparison Study: 05/2022 Performing Technologist: Salvadore Farber RVT  Examination Guidelines: A complete evaluation includes at minimum, Doppler waveform signals and systolic blood pressure reading at the level of bilateral brachial, anterior tibial, and posterior tibial arteries, when vessel segments are accessible. Bilateral testing is considered an integral part of a complete examination. Photoelectric Plethysmograph (PPG) waveforms and toe systolic pressure readings are included as required and additional duplex testing as needed. Limited examinations for reoccurring indications may be performed as noted.  ABI Findings: +---------+------------------+-----+----------+--------+ Right    Rt Pressure (mmHg)IndexWaveform  Comment  +---------+------------------+-----+----------+--------+ Brachial 114                                       +---------+------------------+-----+----------+--------+ ATA      85                0.60  monophasic         +---------+------------------+-----+----------+--------+ PTA      118               0.84 monophasic         +---------+------------------+-----+----------+--------+ Great Toe71                0.50 Abnormal           +---------+------------------+-----+----------+--------+ +---------+------------------+-----+----------+-------+ Left     Lt Pressure (mmHg)IndexWaveform  Comment +---------+------------------+-----+----------+-------+ Brachial 141                                      +---------+------------------+-----+----------+-------+ ATA  101               0.72 monophasic        +---------+------------------+-----+----------+-------+ PTA      115               0.82 monophasic        +---------+------------------+-----+----------+-------+ Great Toe                                 amp     +---------+------------------+-----+----------+-------+ +-------+-----------+-----------+------------+------------+ ABI/TBIToday's ABIToday's TBIPrevious ABIPrevious TBI +-------+-----------+-----------+------------+------------+ Right  .84        .50        1.16        .97          +-------+-----------+-----------+------------+------------+ Left   .82        amp        1.1         amp          +-------+-----------+-----------+------------+------------+ Bilateral ABIs appear decreased compared to prior study on 05/2022.  Summary: Right: Resting right ankle-brachial index indicates mild right lower extremity arterial disease. The right toe-brachial index is abnormal. Left: Resting left ankle-brachial index indicates mild left lower extremity arterial disease. *See table(s) above for measurements and observations.  Electronically signed by [name] MD on 12/29/2022 at 11:21:05 AM.    Final        Assessment & Plan:   1. Atherosclerosis of native artery of both lower extremities with intermittent claudication (HCC) We had a discussion  regarding her symptoms.  At this point her symptoms of claudication they are fairly mild.  Her studies today indicate that there is no acute obstruction of her bypass grafts.  The patient has had multiple interventions and is not keen just yet to move forward with another intervention.  She is advised to increase activity to help promote collateralization which may assist with her claudication-like symptoms.  Will plan on having her back in 3 months to repeat her studies.  She is advised however if her pain worsens to contact us sooner.  2. Controlled type 2 DM with peripheral circulatory disorder (HCC) Continue hypoglycemic medications as already ordered, these medications have been reviewed and there are no changes at this time.  Hgb A1C to be monitored as already arranged by primary service  3. Hyperlipidemia associated with type 2 diabetes mellitus (HCC) Continue statin as ordered and reviewed, no changes at this time  4. Primary hypertension Continue antihypertensive medications as already ordered, these medications have been reviewed and there are no changes at this time.   Current Outpatient Medications on File Prior to Visit  Medication Sig Dispense Refill   acetaminophen (TYLENOL) 500 MG tablet Take 1,000 mg by mouth every 6 (six) hours as needed for mild pain or headache.      ALPRAZolam (XANAX) 0.5 MG tablet TAKE 1 TABLET(0.5 MG) BY MOUTH DAILY AS NEEDED FOR ANXIETY 30 tablet 5   amLODipine (NORVASC) 2.5 MG tablet TAKE 1 TABLET(2.5 MG) BY MOUTH DAILY 90 tablet 1   aspirin 81 MG chewable tablet Chew 81 mg by mouth at bedtime.      atorvastatin (LIPITOR) 20 MG tablet TAKE 1 TABLET(20 MG) BY MOUTH DAILY 90 tablet 3   BIOTIN PO Take 1 tablet by mouth daily.     Cholecalciferol (VITAMIN D3) 2000 UNITS TABS Take 2,000 Units by mouth daily after supper.  clopidogrel (PLAVIX) 75 MG tablet TAKE 1 TABLET(75 MG) BY MOUTH DAILY 90 tablet 1   Continuous Blood Gluc Sensor (FREESTYLE LIBRE 2  SENSOR) MISC Use to check sugar at least 4 times daily 2 each 2   empagliflozin (JARDIANCE) 25 MG TABS tablet TAKE 1 TABLET(25 MG) BY MOUTH DAILY BEFORE AND BREAKFAST 90 tablet 2   gabapentin (NEURONTIN) 800 MG tablet TAKE 1 TABLET(800 MG) BY MOUTH THREE TIMES DAILY 270 tablet 3   glucose blood test strip Use you check blood sugars up to four times daily. 100 each 12   insulin degludec (TRESIBA FLEXTOUCH) 100 UNIT/ML FlexTouch Pen Inject 20 Units into the skin daily. 15 mL 2   insulin lispro (HUMALOG KWIKPEN) 100 UNIT/ML KwikPen ADMINISTER 10 UNITS UNDER THE SKIN THREE TIMES DAILY BEFORE MEALS (Patient taking differently: ADMINISTER 10 UNITS UNDER THE SKIN THREE TIMES DAILY BEFORE MEALS PRN) 15 mL 0   Insulin Pen Needle (PEN NEEDLES) 32G X 4 MM MISC Use to take insulin daily 100 each 3   losartan (COZAAR) 100 MG tablet Take 1 tablet (100 mg total) by mouth daily. 90 tablet 1   Multiple Vitamin (MULTIVITAMIN) tablet Take 1 tablet by mouth daily.     multivitamin-lutein (OCUVITE-LUTEIN) CAPS capsule Take 1 capsule by mouth daily.     triamcinolone ointment (KENALOG) 0.5 % Apply 1 Application topically 2 (two) times daily. 30 g 0   No current facility-administered medications on file prior to visit.    There are no Patient Instructions on file for this visit. No follow-ups on file.   Laura Spinner, NP

## 2023-01-15 ENCOUNTER — Telehealth: Payer: Self-pay | Admitting: Internal Medicine

## 2023-01-15 NOTE — Telephone Encounter (Signed)
Pt called in asking if we have any [place] 2 sample in office? Please advice.

## 2023-01-16 DIAGNOSIS — E1169 Type 2 diabetes mellitus with other specified complication: Secondary | ICD-10-CM | POA: Diagnosis not present

## 2023-01-19 NOTE — Telephone Encounter (Signed)
LMTCb. Need to let pt know that we do not have the [place] 2 sensors.

## 2023-01-19 NOTE — Telephone Encounter (Signed)
Is it okay to give her a sample of the [place] 2?

## 2023-01-21 ENCOUNTER — Other Ambulatory Visit: Payer: Self-pay | Admitting: Internal Medicine

## 2023-01-27 ENCOUNTER — Encounter: Payer: Self-pay | Admitting: Internal Medicine

## 2023-01-27 ENCOUNTER — Ambulatory Visit (INDEPENDENT_AMBULATORY_CARE_PROVIDER_SITE_OTHER): Payer: 59 | Admitting: Internal Medicine

## 2023-01-27 ENCOUNTER — Other Ambulatory Visit (HOSPITAL_COMMUNITY)
Admission: RE | Admit: 2023-01-27 | Discharge: 2023-01-27 | Disposition: A | Discharge status: Home / Self Care | Payer: 59 | Source: Ambulatory Visit | Attending: Internal Medicine | Admitting: Internal Medicine

## 2023-01-27 VITALS — BP 132/64 | HR 42 | Temp 98.1°F | Ht 67.0 in | Wt 142.6 lb

## 2023-01-27 DIAGNOSIS — Z1151 Encounter for screening for human papillomavirus (HPV): Secondary | ICD-10-CM | POA: Diagnosis not present

## 2023-01-27 DIAGNOSIS — Z0000 Encounter for general adult medical examination without abnormal findings: Secondary | ICD-10-CM | POA: Diagnosis not present

## 2023-01-27 DIAGNOSIS — Z01419 Encounter for gynecological examination (general) (routine) without abnormal findings: Secondary | ICD-10-CM | POA: Diagnosis present

## 2023-01-27 DIAGNOSIS — Z124 Encounter for screening for malignant neoplasm of cervix: Secondary | ICD-10-CM

## 2023-01-27 DIAGNOSIS — I25118 Atherosclerotic heart disease of native coronary artery with other forms of angina pectoris: Secondary | ICD-10-CM | POA: Diagnosis not present

## 2023-01-27 DIAGNOSIS — E785 Hyperlipidemia, unspecified: Secondary | ICD-10-CM

## 2023-01-27 DIAGNOSIS — E1151 Type 2 diabetes mellitus with diabetic peripheral angiopathy without gangrene: Secondary | ICD-10-CM | POA: Diagnosis not present

## 2023-01-27 DIAGNOSIS — Z7984 Long term (current) use of oral hypoglycemic drugs: Secondary | ICD-10-CM | POA: Diagnosis not present

## 2023-01-27 DIAGNOSIS — I10 Essential (primary) hypertension: Secondary | ICD-10-CM | POA: Diagnosis not present

## 2023-01-27 DIAGNOSIS — E1169 Type 2 diabetes mellitus with other specified complication: Secondary | ICD-10-CM

## 2023-01-27 LAB — COMPREHENSIVE METABOLIC PANEL
ALT: 34 U/L (ref 0–35)
AST: 30 U/L (ref 0–37)
Albumin: 3.9 g/dL (ref 3.5–5.2)
Alkaline Phosphatase: 76 U/L (ref 39–117)
BUN: 23 mg/dL (ref 6–23)
CO2: 28 mEq/L (ref 19–32)
Calcium: 9.4 mg/dL (ref 8.4–10.5)
Chloride: 101 mEq/L (ref 96–112)
Creatinine, Ser: 0.97 mg/dL (ref 0.40–1.20)
GFR: 61.81 mL/min (ref >60.00)
Glucose, Bld: 198 mg/dL — ABNORMAL HIGH (ref 70–99)
Potassium: 4.8 mEq/L (ref 3.5–5.1)
Sodium: 133 mEq/L — ABNORMAL LOW (ref 135–145)
Total Bilirubin: 0.5 mg/dL (ref 0.2–1.2)
Total Protein: 6.9 g/dL (ref 6.0–8.3)

## 2023-01-27 LAB — LDL CHOLESTEROL, DIRECT: Direct LDL: 36 mg/dL

## 2023-01-27 LAB — LIPID PANEL
Cholesterol: 85 mg/dL (ref 0–200)
HDL: 41.8 mg/dL (ref >39.00)
LDL Cholesterol: 30 mg/dL (ref 0–99)
NonHDL: 43.48
Total CHOL/HDL Ratio: 2
Triglycerides: 65 mg/dL (ref 0.0–149.0)
VLDL: 13 mg/dL (ref 0.0–40.0)

## 2023-01-27 LAB — HEMOGLOBIN A1C: Hgb A1c MFr Bld: 8 % — ABNORMAL HIGH (ref 4.6–6.5)

## 2023-01-27 MED ORDER — TRIAMCINOLONE ACETONIDE 0.5 % EX OINT: 1.0000 | TOPICAL_OINTMENT | Freq: Two times a day (BID) | CUTANEOUS | 0 refills | Status: DC

## 2023-01-27 MED ORDER — LOSARTAN POTASSIUM 100 MG PO TABS: 100.0000 mg | ORAL_TABLET | Freq: Every day | ORAL | 1 refills | Status: DC

## 2023-01-27 MED ORDER — TETANUS-DIPHTH-ACELL PERTUSSIS 5-2.5-18.5 LF-MCG/0.5 IM SUSY: 0.5000 mL | PREFILLED_SYRINGE | Freq: Once | INTRAMUSCULAR | 0 refills | Status: AC

## 2023-01-27 MED ORDER — ZOSTER VAC RECOMB ADJUVANTED 50 MCG/0.5ML IM SUSR: 0.5000 mL | Freq: Once | INTRAMUSCULAR | 1 refills | Status: AC

## 2023-01-27 NOTE — Progress Notes (Signed)
Patient ID: Laura Reed, female    DOB: March 17, 1961  Age: 62 y.o. MRN: [id number]  The patient is here for annual preventive examination and management of other chronic and acute problems.   The risk factors are reflected in the social history.   The roster of all physicians providing medical care to patient - is listed in the Snapshot section of the chart.   Activities of daily living:  The patient is 100% independent in all ADLs: dressing, toileting, feeding as well as independent mobility   Home safety : The patient has smoke detectors in the home. They wear seatbelts.  There are no unsecured firearms at home. There is no violence in the home.    There is no risks for hepatitis, STDs or HIV. There is no   history of blood transfusion. They have no travel history to infectious disease endemic areas of the world.   The patient has seen their dentist in the last six month. They have seen their eye doctor in the last year. The patinet  denies slight hearing difficulty with regard to whispered voices and some television programs.  They have deferred audiologic testing in the last year.  They do not  have excessive sun exposure. Discussed the need for sun protection: hats, long sleeves and use of sunscreen if there is significant sun exposure.    Diet: the importance of a healthy diet is discussed. They do have a healthy diet.   The benefits of regular aerobic exercise were discussed. The patient  exercises  3 to 5 days per week  for  60 minutes.    Depression screen: there are no signs or vegative symptoms of depression- irritability, change in appetite, anhedonia, sadness/tearfullness.   The following portions of the patient's history were reviewed and updated as appropriate: allergies, current medications, past family history, past medical history,  past surgical history, past social history  and problem list.   Visual acuity was not assessed per patient preference since the patient has  regular follow up with an  ophthalmologist. Hearing and body mass index were assessed and reviewed.    During the course of the visit the patient was educated and counseled about appropriate screening and preventive services including : fall prevention , diabetes screening, nutrition counseling, colorectal cancer screening, and recommended immunizations.    Chief Complaint:  1) Lung CA screening:  quit 7 years ago. Referral was made,  she just hasn't scheduled   2) cervical CA screening  3) CAD:;  has been transferred to [facility] following retirement of Mariel Kansky.  Does not like him.  Wants to transfer to [facility]  4) T2DM:  using  20 units of tresiba and 25 mg jardiance ,  no meal time insulin unless she is anticipating a high carb meal.  Uses 3 units at the most .  I have downloaded and reviewed the data from patient's continuous blood glucose monitor. Patient's  sugars have been  IN RANGE   75  % OF THE TIME,   BELOW RANGE    0% of the time.  And ABOVE RANGE 25   % OF THE TIME .  Medication changes were made based on this review as follows:   Review of Symptoms  Patient denies headache, fevers, malaise, unintentional weight loss, skin rash, eye pain, sinus congestion and sinus pain, sore throat, dysphagia,  hemoptysis , cough, dyspnea, wheezing, chest pain, palpitations, orthopnea, edema, abdominal pain, nausea, melena, diarrhea, constipation, flank pain, dysuria, hematuria, urinary  Frequency, nocturia, numbness, tingling, seizures,  Focal weakness, Loss of consciousness,  Tremor, insomnia, depression, anxiety, and suicidal ideation.    Physical Exam:  BP 132/64   Pulse (!) 42   Temp 98.1 F (36.7 C) (Oral)   Ht 5\' 7"  (1.702 m)   Wt 142 lb 9.6 oz (64.7 kg)   SpO2 96%   BMI 22.33 kg/m    Physical Exam Vitals reviewed.  Constitutional:      General: She is not in acute distress.    Appearance: Normal appearance. She is well-developed and normal weight. She is not ill-appearing,  toxic-appearing or diaphoretic.  HENT:     Head: Normocephalic.     Right Ear: Tympanic membrane, ear canal and external ear normal. There is no impacted cerumen.     Left Ear: Tympanic membrane, ear canal and external ear normal. There is no impacted cerumen.     Nose: Nose normal.     Mouth/Throat:     Mouth: Mucous membranes are moist.     Pharynx: Oropharynx is clear.  Eyes:     General: No scleral icterus.       Right eye: No discharge.        Left eye: No discharge.     Conjunctiva/sclera: Conjunctivae normal.     Pupils: Pupils are equal, round, and reactive to light.  Neck:     Thyroid: No thyromegaly.     Vascular: No carotid bruit or JVD.  Cardiovascular:     Rate and Rhythm: Normal rate and regular rhythm.     Heart sounds: Normal heart sounds.  Pulmonary:     Effort: Pulmonary effort is normal. No respiratory distress.     Breath sounds: Normal breath sounds.  Chest:  Breasts:    Breasts are symmetrical.     Right: Normal. No swelling, inverted nipple, mass, nipple discharge, skin change or tenderness.     Left: Normal. No swelling, inverted nipple, mass, nipple discharge, skin change or tenderness.  Abdominal:     General: Bowel sounds are normal.     Palpations: Abdomen is soft. There is no mass.     Tenderness: There is no abdominal tenderness. There is no guarding or rebound.     Hernia: There is no hernia in the left inguinal area or right inguinal area.  Genitourinary:    Exam position: Lithotomy position.     Pubic Area: No rash or pubic lice.      Labia:        Right: No rash, tenderness, lesion or injury.        Left: No rash, tenderness, lesion or injury.      Vagina: Normal.     Cervix: Normal.     Uterus: Normal.      Adnexa: Right adnexa normal and left adnexa normal.  Musculoskeletal:        General: Normal range of motion.     Cervical back: Normal range of motion and neck supple.  Lymphadenopathy:     Cervical: No cervical adenopathy.      Upper Body:     Right upper body: No supraclavicular, axillary or pectoral adenopathy.     Left upper body: No supraclavicular, axillary or pectoral adenopathy.     Lower Body: No right inguinal adenopathy. No left inguinal adenopathy.  Skin:    General: Skin is warm and dry.  Neurological:     General: No focal deficit present.     Mental Status: She is alert and  oriented to person, place, and time. Mental status is at baseline.  Psychiatric:        Mood and Affect: Mood normal.        Behavior: Behavior normal.        Thought Content: Thought content normal.        Judgment: Judgment normal.     Assessment and Plan: Encounter for preventive health examination Assessment & Plan: age appropriate education and counseling updated, referrals for preventative services and immunizations addressed, dietary and smoking counseling addressed, most recent labs reviewed.  I have personally reviewed and have noted:   1) the patient's medical and social history 2) The pt's use of alcohol, tobacco, and illicit drugs 3) The patient's current medications and supplements 4) Functional ability including ADL's, fall risk, home safety risk, hearing and visual impairment 5) Diet and physical activities 6) Evidence for depression or mood disorder 7) The patient's height, weight, and BMI have been recorded in the chart   I have made referrals, and provided counseling and education based on review of the above   Cervical cancer screening -     Cytology - PAP  Primary hypertension -     Losartan Potassium; Take 1 tablet (100 mg total) by mouth daily.  Dispense: 90 tablet; Refill: 1  Controlled type 2 DM with peripheral circulatory disorder [facility]) Assessment & Plan: She continues to have frequent follow up with Vascular for surveillance of carotid stenosis and aortoiliac disease. She has lost control of diabetes again.  Instructed on proper use of humalog.  asekd to check pre and post prandials for one  week and submit readings soe a slidnig scale can be determined .  C  100 mg losartan and 20 mg atorvastatin .   Lab Results  Component Value Date   HGBA1C 7.5 (H) 09/08/2022   Lab Results  Component Value Date   CHOL 100 09/08/2022   HDL 46.70 09/08/2022   LDLCALC 41 09/08/2022   LDLDIRECT 43.0 09/08/2022   TRIG 64.0 09/08/2022   CHOLHDL 2 09/08/2022   Lab Results  Component Value Date   LABMICR See below: 08/12/2022   LABMICR See below: 11/06/2021   MICROALBUR 5.6 (H) 09/08/2022   MICROALBUR 2.1 (H) 11/19/2021      Orders: -     Hemoglobin A1c -     Comprehensive metabolic panel  Hyperlipidemia associated with type 2 diabetes mellitus (HCC) Assessment & Plan: Loss of glycemic control noted. In January,  she has a history of  severe hypoglycemia leading to LOC  in the past . She is currently taking  20 units of Tresiba , 25 mg Jardiance,  and not using SSI .   I have downloaded and reviewed the data from patient's continuous blood glucose monitor. Patient's  sugars have been  IN RANGE  75   % OF THE TIME,   BELOW RANGE   0 % of the time.  And ABOVE RANGE  25 % OF THE TIME .  Medication changes were made based on this review as follows:   none  Lab Results  Component Value Date   CHOL 85 01/27/2023   HDL 41.80 01/27/2023   LDLCALC 30 01/27/2023   LDLDIRECT 36.0 01/27/2023   TRIG 65.0 01/27/2023   CHOLHDL 2 01/27/2023   Lab Results  Component Value Date   ALT 34 01/27/2023   AST 30 01/27/2023   ALKPHOS 76 01/27/2023   BILITOT 0.5 01/27/2023  Orders: -     Lipid panel -     LDL cholesterol, direct  Coronary artery disease of native artery of native heart with stable angina pectoris [facility]) -     Ambulatory referral to Cardiology  Other orders -     Tetanus-Diphth-Acell Pertussis; Inject 0.5 mLs into the muscle once for 1 dose.  Dispense: 0.5 mL; Refill: 0 -     Zoster Vac Recomb Adjuvanted; Inject 0.5 mLs into the muscle once for 1 dose.   Dispense: 1 each; Refill: 1 -     Triamcinolone Acetonide; Apply 1 Application topically 2 (two) times daily.  Dispense: 80 g; Refill: 0    Return in about 3 months (around 04/29/2023).  Sherlene Shams, MD

## 2023-01-27 NOTE — Assessment & Plan Note (Signed)
She continues to have frequent follow up with Vascular for surveillance of carotid stenosis and aortoiliac disease. She has lost control of diabetes again.  Instructed on proper use of humalog.  asekd to check pre and post prandials for one week and submit readings soe a slidnig scale can be determined .  C  100 mg losartan and 20 mg atorvastatin .   Lab Results  Component Value Date   HGBA1C 7.5 (H) 09/08/2022   Lab Results  Component Value Date   CHOL 100 09/08/2022   HDL 46.70 09/08/2022   LDLCALC 41 09/08/2022   LDLDIRECT 43.0 09/08/2022   TRIG 64.0 09/08/2022   CHOLHDL 2 09/08/2022   Lab Results  Component Value Date   LABMICR See below: 08/12/2022   LABMICR See below: 11/06/2021   MICROALBUR 5.6 (H) 09/08/2022   MICROALBUR 2.1 (H) 11/19/2021

## 2023-01-27 NOTE — Assessment & Plan Note (Signed)
age appropriate education and counseling updated, referrals for preventative services and immunizations addressed, dietary and smoking counseling addressed, most recent labs reviewed.  I have personally reviewed and have noted:  1) the patient's medical and social history 2) The pt's use of alcohol, tobacco, and illicit drugs 3) The patient's current medications and supplements 4) Functional ability including ADL's, fall risk, home safety risk, hearing and visual impairment 5) Diet and physical activities 6) Evidence for depression or mood disorder 7) The patient's height, weight, and BMI have been recorded in the chart  I have made referrals, and provided counseling and education based on review of the above 

## 2023-01-27 NOTE — Assessment & Plan Note (Signed)
Loss of glycemic control noted. In January,  she has a history of  severe hypoglycemia leading to LOC  in the past . She is currently taking  20 units of Tresiba , 25 mg Jardiance,  and not using SSI .   I have downloaded and reviewed the data from patient's continuous blood glucose monitor. Patient's  sugars have been  IN RANGE  75   % OF THE TIME,   BELOW RANGE   0 % of the time.  And ABOVE RANGE  25 % OF THE TIME .  Medication changes were made based on this review as follows:   none  Lab Results  Component Value Date   CHOL 85 01/27/2023   HDL 41.80 01/27/2023   LDLCALC 30 01/27/2023   LDLDIRECT 36.0 01/27/2023   TRIG 65.0 01/27/2023   CHOLHDL 2 01/27/2023   Lab Results  Component Value Date   ALT 34 01/27/2023   AST 30 01/27/2023   ALKPHOS 76 01/27/2023   BILITOT 0.5 01/27/2023

## 2023-01-27 NOTE — Patient Instructions (Addendum)
Please call Laura Reed back and the plmonary clinic to schedule your lung cancer screening.   You are due for your tetanus-diptheria-pertussis vaccine   (TDaP)  and your shingles vaccine .  They are free at your  pharmacy

## 2023-01-28 ENCOUNTER — Telehealth: Payer: Self-pay | Admitting: Acute Care

## 2023-01-28 ENCOUNTER — Other Ambulatory Visit: Payer: Self-pay

## 2023-01-28 DIAGNOSIS — Z87891 Personal history of nicotine dependence: Secondary | ICD-10-CM

## 2023-01-28 DIAGNOSIS — Z122 Encounter for screening for malignant neoplasm of respiratory organs: Secondary | ICD-10-CM

## 2023-01-28 NOTE — Telephone Encounter (Signed)
Patient is calling regarding a referral for a lung screening.  Please call patient to schedule an appt.  CB# [phone]

## 2023-01-28 NOTE — Telephone Encounter (Signed)
Will give her a call today

## 2023-01-29 LAB — CYTOLOGY - PAP
Adequacy: ABSENT
Comment: NEGATIVE
Diagnosis: NEGATIVE
High risk HPV: NEGATIVE

## 2023-02-02 NOTE — Telephone Encounter (Signed)
Pt scheduled for 1 month f/up to review blood sugar readings

## 2023-02-02 NOTE — Telephone Encounter (Signed)
Pt called in stating if she can come by to see DR. [name] nurse. Pt will like to dropped off her sensor readings. Please advice.

## 2023-02-04 ENCOUNTER — Encounter (INDEPENDENT_AMBULATORY_CARE_PROVIDER_SITE_OTHER): Payer: Self-pay

## 2023-02-10 ENCOUNTER — Other Ambulatory Visit: Payer: 59 | Admitting: Urology

## 2023-02-12 ENCOUNTER — Ambulatory Visit: Payer: 59 | Admitting: Cardiology

## 2023-03-04 ENCOUNTER — Ambulatory Visit (INDEPENDENT_AMBULATORY_CARE_PROVIDER_SITE_OTHER): Payer: 59 | Admitting: Internal Medicine

## 2023-03-04 ENCOUNTER — Encounter: Payer: Self-pay | Admitting: Internal Medicine

## 2023-03-04 VITALS — BP 106/64 | HR 75 | Ht 67.0 in | Wt 139.0 lb

## 2023-03-04 DIAGNOSIS — E785 Hyperlipidemia, unspecified: Secondary | ICD-10-CM | POA: Diagnosis not present

## 2023-03-04 DIAGNOSIS — R195 Other fecal abnormalities: Secondary | ICD-10-CM | POA: Diagnosis not present

## 2023-03-04 DIAGNOSIS — I10 Essential (primary) hypertension: Secondary | ICD-10-CM | POA: Diagnosis not present

## 2023-03-04 DIAGNOSIS — Z794 Long term (current) use of insulin: Secondary | ICD-10-CM | POA: Diagnosis not present

## 2023-03-04 DIAGNOSIS — E1169 Type 2 diabetes mellitus with other specified complication: Secondary | ICD-10-CM

## 2023-03-04 DIAGNOSIS — E119 Type 2 diabetes mellitus without complications: Secondary | ICD-10-CM | POA: Diagnosis not present

## 2023-03-04 DIAGNOSIS — E041 Nontoxic single thyroid nodule: Secondary | ICD-10-CM

## 2023-03-04 DIAGNOSIS — Z9889 Other specified postprocedural states: Secondary | ICD-10-CM | POA: Diagnosis not present

## 2023-03-04 MED ORDER — AMLODIPINE BESYLATE 2.5 MG PO TABS
ORAL_TABLET | ORAL | 1 refills | Status: DC
Start: 2023-03-04 — End: 2023-06-18

## 2023-03-04 NOTE — Patient Instructions (Addendum)
Your diabetes control has improved!  We are still seeing some persistently high readings in the morning   so:   Only add 3 units of insulin at breakfast if your sugar is 120 and you are planning to eat   Continue the 3 units before lunch and dinner    Continue 20 units of Tresiba

## 2023-03-04 NOTE — Assessment & Plan Note (Addendum)
I have downloaded and reviewed the data from patient's continuous blood glucose monitor. Patient's  sugars have been  IN RANGE   77    % OF THE TIME,   BELOW RANGE 0   % of the time.  And ABOVE RANGE   3% OF THE TIME .  Medication changes were not made based on this review : Continue use of Tresbiba and lispro bid,  add morning dose if pre breakfast meal is 120 or higher

## 2023-03-04 NOTE — Assessment & Plan Note (Addendum)
Loss of glycemic control notedat last visit in July due to medication nonadherence  , so  no changes were made to regimen of tresiba and Lispro I have downloaded and reviewed the data from patient's continuous blood glucose monitor. Patient's  sugars have been  IN RANGE  73   % OF THE TIME,   BELOW RANGE  0  % of the time.  And ABOVE RANGE 27  % OF THE TIME .  Medication changes were made based on this review as follows:aDDING 3 UNTITS OF INSULIN AT BREAKFAST       Lab Results  Component Value Date   HGBA1C 8.0 (H) 01/27/2023     Lab Results  Component Value Date   CHOL 85 01/27/2023   HDL 41.80 01/27/2023   LDLCALC 30 01/27/2023   LDLDIRECT 36.0 01/27/2023   TRIG 65.0 01/27/2023   CHOLHDL 2 01/27/2023   Lab Results  Component Value Date   ALT 34 01/27/2023   AST 30 01/27/2023   ALKPHOS 76 01/27/2023   BILITOT 0.5 01/27/2023

## 2023-03-04 NOTE — Assessment & Plan Note (Signed)
Carotid duplex tdone June 2024 noted  RICA 1-39% s/p CEA and LICA 60-79%.  Previous carotid duplex shows RICA 1-39% s/p CEA and LICA 40-59%.

## 2023-03-04 NOTE — Progress Notes (Signed)
Subjective:  Patient ID: Laura Reed, female    DOB: March 05, 1961  Age: 62 y.o. MRN: [id number]  CC: The primary encounter diagnosis was Insulin-requiring or dependent type II diabetes mellitus (HCC). Diagnoses of Primary hypertension, Hyperlipidemia associated with type 2 diabetes mellitus (HCC), S/P carotid endarterectomy, Thyroid nodule, and Positive colorectal cancer screening using Cologuard test were also pertinent to this visit.   HPI Laura Reed presents for  Chief Complaint  Patient presents with   Medical Management of Chronic Issues    1 month follow up    1) TYPE 2 dm:    She  feels generally well,  IS WALKING FOR EXERCISE .  WEIGHT IS STABLE. USING CBG MONITOR   Taking  lispro twice daily and using only 3 units ,  using jardiance 25 mg   daily , and Tresiba 20 units.  . Following a carbohydrate modified diet 6 days per week. SHE HAS neuropathy and  PAD  but Denies  burning and tingling of extremities. Appetite is good.    Current BS is 258 11:25 am ,  ate a slice of lemon bread 2 hours ago , usually eats a  banana In the morning. Marland Kitchen     2) Reviewed recent Lung scan sept 10    Outpatient Medications Prior to Visit  Medication Sig Dispense Refill   acetaminophen (TYLENOL) 500 MG tablet Take 1,000 mg by mouth every 6 (six) hours as needed for mild pain or headache.      ALPRAZolam (XANAX) 0.5 MG tablet TAKE 1 TABLET(0.5 MG) BY MOUTH DAILY AS NEEDED FOR ANXIETY 30 tablet 5   aspirin 81 MG chewable tablet Chew 81 mg by mouth at bedtime.      atorvastatin (LIPITOR) 20 MG tablet TAKE 1 TABLET(20 MG) BY MOUTH DAILY 90 tablet 3   BIOTIN PO Take 1 tablet by mouth daily.     Cholecalciferol (VITAMIN D3) 2000 UNITS TABS Take 2,000 Units by mouth daily after supper.      clopidogrel (PLAVIX) 75 MG tablet TAKE 1 TABLET(75 MG) BY MOUTH DAILY 90 tablet 1   Continuous Blood Gluc Sensor (FREESTYLE LIBRE 2 SENSOR) MISC Use to check sugar at least 4 times daily 2 each 2   empagliflozin  (JARDIANCE) 25 MG TABS tablet TAKE 1 TABLET(25 MG) BY MOUTH DAILY BEFORE AND BREAKFAST 90 tablet 2   gabapentin (NEURONTIN) 800 MG tablet TAKE 1 TABLET(800 MG) BY MOUTH THREE TIMES DAILY 270 tablet 3   glucose blood test strip Use you check blood sugars up to four times daily. 100 each 12   insulin degludec (TRESIBA FLEXTOUCH) 100 UNIT/ML FlexTouch Pen Inject 20 Units into the skin daily. 15 mL 2   insulin lispro (HUMALOG KWIKPEN) 100 UNIT/ML KwikPen ADMINISTER 10 UNITS UNDER THE SKIN THREE TIMES DAILY BEFORE MEALS (Patient taking differently: ADMINISTER 10 UNITS UNDER THE SKIN THREE TIMES DAILY BEFORE MEALS PRN) 15 mL 0   Insulin Pen Needle (PEN NEEDLES) 32G X 4 MM MISC Use to take insulin daily 100 each 3   losartan (COZAAR) 100 MG tablet Take 1 tablet (100 mg total) by mouth daily. 90 tablet 1   Multiple Vitamin (MULTIVITAMIN) tablet Take 1 tablet by mouth daily.     multivitamin-lutein (OCUVITE-LUTEIN) CAPS capsule Take 1 capsule by mouth daily.     triamcinolone ointment (KENALOG) 0.5 % Apply 1 Application topically 2 (two) times daily. 80 g 0   amLODipine (NORVASC) 2.5 MG tablet TAKE 1 TABLET(2.5 MG) BY MOUTH  DAILY 90 tablet 1   No facility-administered medications prior to visit.    Review of Systems;  Patient denies headache, fevers, malaise, unintentional weight loss, skin rash, eye pain, sinus congestion and sinus pain, sore throat, dysphagia,  hemoptysis , cough, dyspnea, wheezing, chest pain, palpitations, orthopnea, edema, abdominal pain, nausea, melena, diarrhea, constipation, flank pain, dysuria, hematuria, urinary  Frequency, nocturia, numbness, tingling, seizures,  Focal weakness, Loss of consciousness,  Tremor, insomnia, depression, anxiety, and suicidal ideation.      Objective:  BP 106/64   Pulse 75   Ht 5\' 7"  (1.702 m)   Wt 139 lb (63 kg)   SpO2 95%   BMI 21.77 kg/m   BP Readings from Last 3 Encounters:  03/04/23 106/64  01/27/23 132/64  01/07/23 132/69    Wt  Readings from Last 3 Encounters:  03/04/23 139 lb (63 kg)  01/27/23 142 lb 9.6 oz (64.7 kg)  01/07/23 142 lb 9.6 oz (64.7 kg)    Physical Exam Vitals reviewed.  Constitutional:      General: She is not in acute distress.    Appearance: Normal appearance. She is normal weight. She is not ill-appearing, toxic-appearing or diaphoretic.  HENT:     Head: Normocephalic.  Eyes:     General: No scleral icterus.       Right eye: No discharge.        Left eye: No discharge.     Conjunctiva/sclera: Conjunctivae normal.  Cardiovascular:     Rate and Rhythm: Normal rate and regular rhythm.     Heart sounds: Normal heart sounds.  Pulmonary:     Effort: Pulmonary effort is normal. No respiratory distress.     Breath sounds: Normal breath sounds.  Musculoskeletal:        General: Normal range of motion.  Skin:    General: Skin is warm and dry.  Neurological:     General: No focal deficit present.     Mental Status: She is alert and oriented to person, place, and time. Mental status is at baseline.  Psychiatric:        Mood and Affect: Mood normal.        Behavior: Behavior normal.        Thought Content: Thought content normal.        Judgment: Judgment normal.    Lab Results  Component Value Date   HGBA1C 8.0 (H) 01/27/2023   HGBA1C 7.5 (H) 09/08/2022   HGBA1C 7.9 (H) 04/07/2022    Lab Results  Component Value Date   CREATININE 0.97 01/27/2023   CREATININE 0.91 09/08/2022   CREATININE 0.93 04/07/2022    Lab Results  Component Value Date   WBC 7.7 09/08/2022   HGB 15.1 (H) 09/08/2022   HCT 43.5 09/08/2022   PLT 246.0 09/08/2022   GLUCOSE 198 (H) 01/27/2023   CHOL 85 01/27/2023   TRIG 65.0 01/27/2023   HDL 41.80 01/27/2023   LDLDIRECT 36.0 01/27/2023   LDLCALC 30 01/27/2023   ALT 34 01/27/2023   AST 30 01/27/2023   NA 133 (L) 01/27/2023   K 4.8 01/27/2023   CL 101 01/27/2023   CREATININE 0.97 01/27/2023   BUN 23 01/27/2023   CO2 28 01/27/2023   TSH 1.00  09/08/2022   INR 0.9 10/17/2014   HGBA1C 8.0 (H) 01/27/2023   MICROALBUR 5.6 (H) 09/08/2022    No results found.  Assessment & Plan:  .Insulin-requiring or dependent type II diabetes mellitus (HCC) Assessment & Plan: I have downloaded  and reviewed the data from patient's continuous blood glucose monitor. Patient's  sugars have been  IN RANGE   77    % OF THE TIME,   BELOW RANGE 0   % of the time.  And ABOVE RANGE   3% OF THE TIME .  Medication changes were not made based on this review : Continue use of Tresbiba and lispro bid,  add morning dose if pre breakfast meal is 120 or higher    Primary hypertension Assessment & Plan: she reports compliance with medication regimen  and BPs are under better control with crrent regimen.  No changes today   Orders: -     amLODIPine Besylate; TAKE 1 TABLET(2.5 MG) BY MOUTH DAILY  Dispense: 90 tablet; Refill: 1  Hyperlipidemia associated with type 2 diabetes mellitus (HCC) Assessment & Plan: Loss of glycemic control notedat last visit in July due to medication nonadherence  , so  no changes were made to regimen of tresiba and Lispro .  I have downloaded and reviewed the data from patient's continuous blood glucose monitor. Patient's  sugars have been  IN RANGE  73   % OF THE TIME,   BELOW RANGE  0  % of the time.  And ABOVE RANGE 27  % OF THE TIME .  Medication changes were made based on this review as follows:aDDING 3 UNTITS OF INSULIN AT BREAKFAST       Lab Results  Component Value Date   HGBA1C 8.0 (H) 01/27/2023     Lab Results  Component Value Date   CHOL 85 01/27/2023   HDL 41.80 01/27/2023   LDLCALC 30 01/27/2023   LDLDIRECT 36.0 01/27/2023   TRIG 65.0 01/27/2023   CHOLHDL 2 01/27/2023   Lab Results  Component Value Date   ALT 34 01/27/2023   AST 30 01/27/2023   ALKPHOS 76 01/27/2023   BILITOT 0.5 01/27/2023            S/P carotid endarterectomy Assessment & Plan: Carotid duplex tdone June 2024 noted  RICA 1-39%  s/p CEA and LICA 60-79%.  Previous carotid duplex shows RICA 1-39% s/p CEA and LICA 40-59%.     Thyroid nodule Assessment & Plan: Continued  surveillance by [facility] with serial U/s s, which are  done in September    Positive colorectal cancer screening using Cologuard test Assessment & Plan: 6 MM TA REMOVED BY BYRNETT IN SEPT  2023       I provided 30 minutes of face-to-face time during this encounter reviewing patient's last visit with me, patient's  most recent visit with cardiology,  nephrology,  and neurology,  recent surgical and non surgical procedures, previous  labs and imaging studies, counseling on currently addressed issues,  and post visit ordering to diagnostics and therapeutics .   Follow-up: Return in about 4 weeks (around 04/01/2023) for follow up diabetes.   Sherlene Shams, MD

## 2023-03-06 NOTE — Assessment & Plan Note (Signed)
6 MM TA REMOVED BY BYRNETT IN SEPT  2023

## 2023-03-06 NOTE — Assessment & Plan Note (Signed)
Continued  surveillance by [facility] with serial U/s s, which are  done in September

## 2023-03-06 NOTE — Assessment & Plan Note (Signed)
she reports compliance with medication regimen  and BPs are under better control with crrent regimen.  No changes today

## 2023-03-11 ENCOUNTER — Other Ambulatory Visit: Payer: 59 | Admitting: Urology

## 2023-03-17 ENCOUNTER — Encounter: Payer: 59 | Admitting: Acute Care

## 2023-03-17 ENCOUNTER — Telehealth: Payer: Self-pay | Admitting: Acute Care

## 2023-03-17 NOTE — Telephone Encounter (Signed)
I spoke with Ms. Stefanick. Her son is having emergency surgery , so shew needs to cancel her SDMV and reschedule her CT and the [facility] in October once her son has recovered. I ytold her we would be happy to do that.  Sherre Lain, and [place], can we get Ms. Goonan rescxheduled for October? Thanks so much.

## 2023-03-17 NOTE — Telephone Encounter (Signed)
Current appts cancelled and will plan to reschedule for October.

## 2023-03-17 NOTE — Telephone Encounter (Signed)
Patient has a scheduled shared decision making visit for 1:30 pm. I have called and there is no answer. I left a message with our contact information requesting she call to complete the visit. If we do not get in touch with her after 1 additional call , we will need to cancel the Ct Scheduled at [facility] for 9:30 am , and reschedule both the Ct Chest and the [facility]. She will be considered a no show after 1:45.

## 2023-03-18 ENCOUNTER — Ambulatory Visit: Payer: 59

## 2023-03-30 ENCOUNTER — Telehealth: Payer: Self-pay | Admitting: Internal Medicine

## 2023-03-30 DIAGNOSIS — Z794 Long term (current) use of insulin: Secondary | ICD-10-CM

## 2023-03-30 NOTE — Telephone Encounter (Signed)
Prescription Request  03/30/2023  LOV: 03/04/2023  What is the name of the medication or equipment? Insulin Pen Needle (PEN NEEDLES) 32G X 4 MM MISC   Have you contacted your pharmacy to request a refill? Yes   Which pharmacy would you like this sent to?  [id number] [place], [place] - [address] AT [facility] OF [address] [place] [address] Phone: [phone] Fax: [phone]    Patient notified that their request is being sent to the clinical staff for review and that they should receive a response within 2 business days.   Please advise at Mobile [phone] (mobile)

## 2023-03-31 MED ORDER — PEN NEEDLES 32G X 4 MM MISC
3 refills | Status: DC
Start: 2023-03-31 — End: 2024-04-14

## 2023-03-31 NOTE — Telephone Encounter (Signed)
Refilled

## 2023-03-31 NOTE — Addendum Note (Signed)
Addended by: Sandy Salaam on: 03/31/2023 04:48 PM   Modules accepted: Orders

## 2023-04-02 DIAGNOSIS — Z89422 Acquired absence of other left toe(s): Secondary | ICD-10-CM | POA: Diagnosis not present

## 2023-04-02 DIAGNOSIS — E1140 Type 2 diabetes mellitus with diabetic neuropathy, unspecified: Secondary | ICD-10-CM | POA: Diagnosis not present

## 2023-04-02 DIAGNOSIS — Z89421 Acquired absence of other right toe(s): Secondary | ICD-10-CM | POA: Diagnosis not present

## 2023-04-02 DIAGNOSIS — B351 Tinea unguium: Secondary | ICD-10-CM | POA: Diagnosis not present

## 2023-04-02 DIAGNOSIS — Z794 Long term (current) use of insulin: Secondary | ICD-10-CM | POA: Diagnosis not present

## 2023-04-02 DIAGNOSIS — L851 Acquired keratosis [keratoderma] palmaris et plantaris: Secondary | ICD-10-CM | POA: Diagnosis not present

## 2023-04-06 NOTE — Telephone Encounter (Signed)
Called and spoke to pt. Rescheduled SDMV for 04/17/23 and rescheduled LDCT for 04/24/23. Patient is aware of appt time/date/location.

## 2023-04-13 ENCOUNTER — Other Ambulatory Visit (INDEPENDENT_AMBULATORY_CARE_PROVIDER_SITE_OTHER): Payer: Self-pay | Admitting: Nurse Practitioner

## 2023-04-13 DIAGNOSIS — Z9889 Other specified postprocedural states: Secondary | ICD-10-CM

## 2023-04-16 ENCOUNTER — Encounter (INDEPENDENT_AMBULATORY_CARE_PROVIDER_SITE_OTHER): Payer: 59

## 2023-04-16 ENCOUNTER — Ambulatory Visit (INDEPENDENT_AMBULATORY_CARE_PROVIDER_SITE_OTHER): Payer: 59 | Admitting: Vascular Surgery

## 2023-04-16 ENCOUNTER — Ambulatory Visit: Payer: 59 | Admitting: Cardiology

## 2023-04-17 ENCOUNTER — Encounter: Payer: Self-pay | Admitting: Physician Assistant

## 2023-04-17 ENCOUNTER — Ambulatory Visit: Payer: 59 | Admitting: Physician Assistant

## 2023-04-17 ENCOUNTER — Telehealth: Payer: Self-pay | Admitting: Internal Medicine

## 2023-04-17 DIAGNOSIS — Z122 Encounter for screening for malignant neoplasm of respiratory organs: Secondary | ICD-10-CM

## 2023-04-17 DIAGNOSIS — Z87891 Personal history of nicotine dependence: Secondary | ICD-10-CM

## 2023-04-17 NOTE — Patient Instructions (Signed)
 Thank you for participating in the  Lung Cancer Screening Program. It was our pleasure to meet you today. We will call you with the results of your scan within the next few days. Your scan will be assigned a Lung RADS category score by the physicians reading the scans.  This Lung RADS score determines follow up scanning.  See below for description of categories, and follow up screening recommendations. We will be in touch to schedule your follow up screening annually or based on recommendations of our providers. We will fax a copy of your scan results to your Primary Care Physician, or the physician who referred you to the program, to ensure they have the results. Please call the office if you have any questions or concerns regarding your scanning experience or results.  Our office number is [phone]. Please speak with Abigail Miyamoto, RN., Karlton Lemon RN, or Pietro Cassis RN. They are  our Lung Cancer Screening RN.'s If They are unavailable when you call, Please leave a message on the voice mail. We will return your call at our earliest convenience.This voice mail is monitored several times a day.  Remember, if your scan is normal, we will scan you annually as long as you continue to meet the criteria for the program. (Age 64-80, Current smoker or smoker who has quit within the last 15 years). If you are a smoker, remember, quitting is the single most powerful action that you can take to decrease your risk of lung cancer and other pulmonary, breathing related problems. We know quitting is hard, and we are here to help.  Please let us know if there is anything we can do to help you meet your goal of quitting. If you are a former smoker, Counselling psychologist. We are proud of you! Remain smoke free! Remember you can refer friends or family members through the number above.  We will screen them to make sure they meet criteria for the program. Thank you for helping [place] take better care of you  by participating in Lung Screening.   Lung RADS Categories:  Lung RADS 1: no nodules or definitely non-concerning nodules.  Recommendation is for a repeat annual scan in 12 months.  Lung RADS 2:  nodules that are non-concerning in appearance and behavior with a very low likelihood of becoming an active cancer. Recommendation is for a repeat annual scan in 12 months.  Lung RADS 3: nodules that are probably non-concerning , includes nodules with a low likelihood of becoming an active cancer.  Recommendation is for a 40-month repeat screening scan. Often noted after an upper respiratory illness. We will be in touch to make sure you have no questions, and to schedule your 22-month scan.  Lung RADS 4 A: nodules with concerning findings, recommendation is most often for a follow up scan in 3 months or additional testing based on our provider's assessment of the scan. We will be in touch to make sure you have no questions and to schedule the recommended 3 month follow up scan.  Lung RADS 4 B:  indicates findings that are concerning. We will be in touch with you to schedule additional diagnostic testing based on our provider's  assessment of the scan.  You can receive free nicotine replacement therapy ( patches, gum or mints) by calling 1-800-QUIT NOW. Please call so we can get you on the path to becoming  a non-smoker. I know it is hard, but you can do this!  Other options for assistance in  smoking cessation ( As covered by your insurance benefits)  Hypnosis for smoking cessation  [employer] [phone]  Acupuncture for smoking cessation  [employer] [phone]

## 2023-04-17 NOTE — Progress Notes (Signed)
  Virtual Visit via Telephone Note  I connected with Laura Reed on 04/17/23 at 1000 by telephone and verified that I am speaking with the correct person using two identifiers.  Location: Patient: home Provider: working virtually from home   I discussed the limitations, risks, security and privacy concerns of performing an evaluation and management service by telephone and the availability of in person appointments. I also discussed with the patient that there may be a patient responsible charge related to this service. The patient expressed understanding and agreed to proceed.       Shared Decision Making Visit Lung Cancer Screening Program [phone])   Eligibility: Age 62 Pack Years Smoking History Calculation 44 (# packs/per year x # years smoked) Recent History of coughing up blood  no Unexplained weight loss? No ( >Than 15 pounds within the last 6 months ) Prior History Lung / other cancer No (Diagnosis within the last 5 years already requiring surveillance chest CT Scans). Smoking Status Former Smoker Former Smokers: Years since quit: 7 years  Quit Date: 2017  Visit Components: Discussion included one or more decision making aids? Yes Discussion included risk/benefits of screening. Yes Discussion included potential follow up diagnostic testing for abnormal scans. Yes Discussion included meaning and risk of over diagnosis. Yes Discussion included meaning and risk of False Positives. Yes Discussion included meaning of total radiation exposure. Yes  Counseling Included: Importance of adherence to annual lung cancer LDCT screening. Yes Impact of comorbidities on ability to participate in the program. Yes Ability and willingness to under diagnostic treatment. Yes  Smoking Cessation Counseling: Former Smokers:  Discussed the importance of maintaining cigarette abstinence. Yes Diagnosis Code: Personal History of Nicotine Dependence. X91.478 Information about tobacco  cessation classes and interventions provided to patient. Yes Written Order for Lung Cancer Screening with LDCT placed in Epic. Yes (CT Chest Lung Cancer Screening Low Dose W/O CM) [id number] Z12.2-Screening of respiratory organs Z87.891-Personal history of nicotine dependence    I spent 25 minutes of face to face time/virtual visit time  with the patient discussing the risks and benefits of lung cancer screening. We took the time to pause at intervals to allow for questions to be asked and answered to ensure understanding. We discussed that they had taken the single most powerful action possible to decrease their risk of developing lung cancer when they quit smoking. I counseled them to remain smoke free, and to contact the office if they ever had the desire to smoke again so that we can provide resources and tools to help support the effort to remain smoke free. We discussed the time and location of the scan, and they  will receive a call or letter with the results within  24-72 hours of receiving them. They have the office contact information in the event they have questions.   They verbalized understanding of all of the above and had no further questions.    I explained to the patient that there has been a high incidence of coronary artery disease noted on these exams. I explained that this is a non-gated exam therefore degree or severity cannot be determined. This patient is on statin therapy. I have asked the patient to follow-up with their PCP regarding any incidental finding of coronary artery disease and management with diet or medication as they feel is clinically indicated. The patient verbalized understanding of the above and had no further questions.      Laura Gasman Jossie Smoot, PA-C

## 2023-04-17 NOTE — Telephone Encounter (Signed)
LMTCB. Need to let pt know that we did receive the fax. All they requested was office notes and they were faxed back to them last week.

## 2023-04-17 NOTE — Telephone Encounter (Signed)
Patient called stating that [facility] sent a fax . The patient asked if the faxed was sent back.The patient also stated that if they had not sent a fax, please call her and she will call them.

## 2023-04-20 NOTE — Telephone Encounter (Signed)
Pt called back and I read the note and she stated she will call them

## 2023-04-20 NOTE — Telephone Encounter (Signed)
noted 

## 2023-04-22 ENCOUNTER — Ambulatory Visit (INDEPENDENT_AMBULATORY_CARE_PROVIDER_SITE_OTHER): Payer: 59

## 2023-04-22 ENCOUNTER — Encounter (INDEPENDENT_AMBULATORY_CARE_PROVIDER_SITE_OTHER): Payer: Self-pay

## 2023-04-22 DIAGNOSIS — Z9889 Other specified postprocedural states: Secondary | ICD-10-CM | POA: Diagnosis not present

## 2023-04-22 DIAGNOSIS — I739 Peripheral vascular disease, unspecified: Secondary | ICD-10-CM | POA: Diagnosis not present

## 2023-04-24 ENCOUNTER — Encounter: Payer: Self-pay | Admitting: Anesthesiology

## 2023-04-24 ENCOUNTER — Other Ambulatory Visit: Payer: Self-pay

## 2023-04-24 ENCOUNTER — Encounter
Admission: RE | Disposition: A | Discharge status: Skilled Nursing Facility | Payer: Self-pay | Source: Home / Self Care | Attending: Vascular Surgery

## 2023-04-24 ENCOUNTER — Encounter: Payer: Self-pay | Admitting: Vascular Surgery

## 2023-04-24 ENCOUNTER — Ambulatory Visit: Payer: 59

## 2023-04-24 ENCOUNTER — Inpatient Hospital Stay
Admission: RE | Admit: 2023-04-24 | Discharge: 2023-05-11 | DRG: 271 | Disposition: A | Discharge status: Skilled Nursing Facility | Payer: 59 | Attending: Vascular Surgery | Admitting: Vascular Surgery

## 2023-04-24 DIAGNOSIS — Z89421 Acquired absence of other right toe(s): Secondary | ICD-10-CM

## 2023-04-24 DIAGNOSIS — Z803 Family history of malignant neoplasm of breast: Secondary | ICD-10-CM

## 2023-04-24 DIAGNOSIS — E11621 Type 2 diabetes mellitus with foot ulcer: Secondary | ICD-10-CM | POA: Diagnosis present

## 2023-04-24 DIAGNOSIS — F419 Anxiety disorder, unspecified: Secondary | ICD-10-CM | POA: Diagnosis present

## 2023-04-24 DIAGNOSIS — E1169 Type 2 diabetes mellitus with other specified complication: Secondary | ICD-10-CM | POA: Diagnosis not present

## 2023-04-24 DIAGNOSIS — Z85528 Personal history of other malignant neoplasm of kidney: Secondary | ICD-10-CM | POA: Diagnosis not present

## 2023-04-24 DIAGNOSIS — I70229 Atherosclerosis of native arteries of extremities with rest pain, unspecified extremity: Secondary | ICD-10-CM | POA: Diagnosis present

## 2023-04-24 DIAGNOSIS — I96 Gangrene, not elsewhere classified: Secondary | ICD-10-CM | POA: Diagnosis not present

## 2023-04-24 DIAGNOSIS — Z89422 Acquired absence of other left toe(s): Secondary | ICD-10-CM

## 2023-04-24 DIAGNOSIS — Z23 Encounter for immunization: Secondary | ICD-10-CM

## 2023-04-24 DIAGNOSIS — Z8614 Personal history of Methicillin resistant Staphylococcus aureus infection: Secondary | ICD-10-CM | POA: Diagnosis not present

## 2023-04-24 DIAGNOSIS — Z87891 Personal history of nicotine dependence: Secondary | ICD-10-CM | POA: Diagnosis not present

## 2023-04-24 DIAGNOSIS — I7143 Infrarenal abdominal aortic aneurysm, without rupture: Secondary | ICD-10-CM

## 2023-04-24 DIAGNOSIS — Z8551 Personal history of malignant neoplasm of bladder: Secondary | ICD-10-CM | POA: Diagnosis not present

## 2023-04-24 DIAGNOSIS — I998 Other disorder of circulatory system: Secondary | ICD-10-CM | POA: Diagnosis not present

## 2023-04-24 DIAGNOSIS — L03032 Cellulitis of left toe: Secondary | ICD-10-CM | POA: Diagnosis not present

## 2023-04-24 DIAGNOSIS — Z79899 Other long term (current) drug therapy: Secondary | ICD-10-CM

## 2023-04-24 DIAGNOSIS — Z89412 Acquired absence of left great toe: Secondary | ICD-10-CM

## 2023-04-24 DIAGNOSIS — I70262 Atherosclerosis of native arteries of extremities with gangrene, left leg: Secondary | ICD-10-CM | POA: Diagnosis present

## 2023-04-24 DIAGNOSIS — T82898A Other specified complication of vascular prosthetic devices, implants and grafts, initial encounter: Secondary | ICD-10-CM | POA: Diagnosis not present

## 2023-04-24 DIAGNOSIS — I700 Atherosclerosis of aorta: Secondary | ICD-10-CM

## 2023-04-24 DIAGNOSIS — Z794 Long term (current) use of insulin: Secondary | ICD-10-CM

## 2023-04-24 DIAGNOSIS — I70222 Atherosclerosis of native arteries of extremities with rest pain, left leg: Secondary | ICD-10-CM

## 2023-04-24 DIAGNOSIS — I70201 Unspecified atherosclerosis of native arteries of extremities, right leg: Secondary | ICD-10-CM | POA: Diagnosis not present

## 2023-04-24 DIAGNOSIS — E1152 Type 2 diabetes mellitus with diabetic peripheral angiopathy with gangrene: Secondary | ICD-10-CM | POA: Diagnosis present

## 2023-04-24 DIAGNOSIS — R69 Illness, unspecified: Secondary | ICD-10-CM | POA: Diagnosis not present

## 2023-04-24 DIAGNOSIS — Z801 Family history of malignant neoplasm of trachea, bronchus and lung: Secondary | ICD-10-CM

## 2023-04-24 DIAGNOSIS — Z7901 Long term (current) use of anticoagulants: Secondary | ICD-10-CM

## 2023-04-24 DIAGNOSIS — E1140 Type 2 diabetes mellitus with diabetic neuropathy, unspecified: Secondary | ICD-10-CM | POA: Diagnosis not present

## 2023-04-24 DIAGNOSIS — Z7982 Long term (current) use of aspirin: Secondary | ICD-10-CM

## 2023-04-24 DIAGNOSIS — N390 Urinary tract infection, site not specified: Secondary | ICD-10-CM | POA: Diagnosis not present

## 2023-04-24 DIAGNOSIS — T82856A Stenosis of peripheral vascular stent, initial encounter: Secondary | ICD-10-CM

## 2023-04-24 DIAGNOSIS — Z7902 Long term (current) use of antithrombotics/antiplatelets: Secondary | ICD-10-CM

## 2023-04-24 DIAGNOSIS — Z9889 Other specified postprocedural states: Secondary | ICD-10-CM

## 2023-04-24 DIAGNOSIS — T82868A Thrombosis of vascular prosthetic devices, implants and grafts, initial encounter: Secondary | ICD-10-CM

## 2023-04-24 DIAGNOSIS — M86172 Other acute osteomyelitis, left ankle and foot: Secondary | ICD-10-CM | POA: Diagnosis not present

## 2023-04-24 DIAGNOSIS — I2510 Atherosclerotic heart disease of native coronary artery without angina pectoris: Secondary | ICD-10-CM | POA: Diagnosis not present

## 2023-04-24 DIAGNOSIS — M869 Osteomyelitis, unspecified: Secondary | ICD-10-CM | POA: Diagnosis not present

## 2023-04-24 DIAGNOSIS — I70213 Atherosclerosis of native arteries of extremities with intermittent claudication, bilateral legs: Secondary | ICD-10-CM | POA: Diagnosis not present

## 2023-04-24 DIAGNOSIS — L97528 Non-pressure chronic ulcer of other part of left foot with other specified severity: Secondary | ICD-10-CM | POA: Diagnosis not present

## 2023-04-24 DIAGNOSIS — I70662 Atherosclerosis of nonbiological bypass graft(s) of the extremities with gangrene, left leg: Secondary | ICD-10-CM

## 2023-04-24 DIAGNOSIS — I70322 Atherosclerosis of unspecified type of bypass graft(s) of the extremities with rest pain, left leg: Secondary | ICD-10-CM | POA: Diagnosis not present

## 2023-04-24 DIAGNOSIS — R079 Chest pain, unspecified: Secondary | ICD-10-CM | POA: Diagnosis not present

## 2023-04-24 DIAGNOSIS — R319 Hematuria, unspecified: Secondary | ICD-10-CM | POA: Diagnosis not present

## 2023-04-24 DIAGNOSIS — L97529 Non-pressure chronic ulcer of other part of left foot with unspecified severity: Secondary | ICD-10-CM | POA: Diagnosis not present

## 2023-04-24 DIAGNOSIS — E1151 Type 2 diabetes mellitus with diabetic peripheral angiopathy without gangrene: Secondary | ICD-10-CM | POA: Diagnosis not present

## 2023-04-24 DIAGNOSIS — Z951 Presence of aortocoronary bypass graft: Secondary | ICD-10-CM

## 2023-04-24 DIAGNOSIS — E119 Type 2 diabetes mellitus without complications: Secondary | ICD-10-CM

## 2023-04-24 DIAGNOSIS — I110 Hypertensive heart disease with heart failure: Secondary | ICD-10-CM | POA: Diagnosis not present

## 2023-04-24 DIAGNOSIS — Z7984 Long term (current) use of oral hypoglycemic drugs: Secondary | ICD-10-CM

## 2023-04-24 DIAGNOSIS — Y832 Surgical operation with anastomosis, bypass or graft as the cause of abnormal reaction of the patient, or of later complication, without mention of misadventure at the time of the procedure: Secondary | ICD-10-CM | POA: Diagnosis present

## 2023-04-24 DIAGNOSIS — E559 Vitamin D deficiency, unspecified: Secondary | ICD-10-CM | POA: Diagnosis not present

## 2023-04-24 DIAGNOSIS — I509 Heart failure, unspecified: Secondary | ICD-10-CM | POA: Diagnosis not present

## 2023-04-24 DIAGNOSIS — Z833 Family history of diabetes mellitus: Secondary | ICD-10-CM

## 2023-04-24 DIAGNOSIS — Z905 Acquired absence of kidney: Secondary | ICD-10-CM | POA: Diagnosis not present

## 2023-04-24 DIAGNOSIS — K219 Gastro-esophageal reflux disease without esophagitis: Secondary | ICD-10-CM | POA: Diagnosis not present

## 2023-04-24 DIAGNOSIS — Z743 Need for continuous supervision: Secondary | ICD-10-CM | POA: Diagnosis not present

## 2023-04-24 DIAGNOSIS — M1990 Unspecified osteoarthritis, unspecified site: Secondary | ICD-10-CM | POA: Diagnosis not present

## 2023-04-24 HISTORY — PX: LOWER EXTREMITY ANGIOGRAPHY: CATH118251

## 2023-04-24 LAB — GLUCOSE, CAPILLARY
Glucose-Capillary: 155 mg/dL — ABNORMAL HIGH (ref 70–99)
Glucose-Capillary: 120 mg/dL — ABNORMAL HIGH (ref 70–99)

## 2023-04-24 LAB — CREATININE, SERUM
Creatinine, Ser: 0.85 mg/dL (ref 0.44–1.00)
GFR, Estimated: >60 mL/min (ref >60)

## 2023-04-24 LAB — BUN: BUN: 21 mg/dL (ref 8–23)

## 2023-04-24 LAB — MRSA NEXT GEN BY PCR, NASAL: MRSA by PCR Next Gen: NOT DETECTED

## 2023-04-24 SURGERY — LOWER EXTREMITY ANGIOGRAPHY
Anesthesia: Moderate Sedation | Site: Leg Lower | Laterality: Left

## 2023-04-24 SURGERY — BYPASS GRAFT FEMORAL-POPLITEAL ARTERY
Anesthesia: Choice | Laterality: Left

## 2023-04-24 MED ORDER — TIROFIBAN HCL IN NACL 5-0.9 MG/100ML-% IV SOLN: 0.1500 ug/kg/min | INTRAVENOUS | Status: DC

## 2023-04-24 MED ORDER — IOHEXOL 300 MG/ML  SOLN
INTRAMUSCULAR | Status: DC | PRN
  Administered 2023-04-24: 45 mL

## 2023-04-24 MED ORDER — INSULIN ASPART 100 UNIT/ML IJ SOLN
0.0000 [IU] | Freq: Three times a day (TID) | INTRAMUSCULAR | Status: DC
  Administered 2023-04-25: 3 [IU] via SUBCUTANEOUS
  Administered 2023-04-25: 2 [IU] via SUBCUTANEOUS
  Administered 2023-04-25: 5 [IU] via SUBCUTANEOUS
  Administered 2023-04-26 – 2023-04-27 (×4): 3 [IU] via SUBCUTANEOUS
  Administered 2023-04-27: 5 [IU] via SUBCUTANEOUS
  Administered 2023-04-27 – 2023-04-28 (×2): 3 [IU] via SUBCUTANEOUS
  Administered 2023-04-28: 5 [IU] via SUBCUTANEOUS
  Administered 2023-04-29: 3 [IU] via SUBCUTANEOUS
  Administered 2023-04-30: 5 [IU] via SUBCUTANEOUS
  Administered 2023-04-30: 8 [IU] via SUBCUTANEOUS
  Administered 2023-04-30: 3 [IU] via SUBCUTANEOUS
  Administered 2023-05-01: 8 [IU] via SUBCUTANEOUS
  Administered 2023-05-01 – 2023-05-02 (×2): 3 [IU] via SUBCUTANEOUS
  Administered 2023-05-02 (×2): 5 [IU] via SUBCUTANEOUS
  Administered 2023-05-03: 11 [IU] via SUBCUTANEOUS
  Administered 2023-05-03 – 2023-05-04 (×4): 5 [IU] via SUBCUTANEOUS
  Administered 2023-05-04: 8 [IU] via SUBCUTANEOUS
  Administered 2023-05-05: 11 [IU] via SUBCUTANEOUS
  Administered 2023-05-05 – 2023-05-06 (×2): 5 [IU] via SUBCUTANEOUS
  Administered 2023-05-06: 8 [IU] via SUBCUTANEOUS
  Administered 2023-05-06: 5 [IU] via SUBCUTANEOUS
  Administered 2023-05-07: 3 [IU] via SUBCUTANEOUS
  Administered 2023-05-07: 8 [IU] via SUBCUTANEOUS
  Administered 2023-05-07: 5 [IU] via SUBCUTANEOUS
  Administered 2023-05-08: 11 [IU] via SUBCUTANEOUS
  Administered 2023-05-08 (×2): 5 [IU] via SUBCUTANEOUS
  Administered 2023-05-09: 2 [IU] via SUBCUTANEOUS
  Administered 2023-05-09: 5 [IU] via SUBCUTANEOUS
  Administered 2023-05-10: 3 [IU] via SUBCUTANEOUS
  Administered 2023-05-10: 5 [IU] via SUBCUTANEOUS
  Administered 2023-05-10: 3 [IU] via SUBCUTANEOUS
  Administered 2023-05-11: 5 [IU] via SUBCUTANEOUS
  Administered 2023-05-11: 3 [IU] via SUBCUTANEOUS
  Filled 2023-04-24 (×44): qty 1

## 2023-04-24 MED ORDER — MIDAZOLAM HCL 5 MG/5ML IJ SOLN
INTRAMUSCULAR | Status: AC
  Filled 2023-04-24: qty 5

## 2023-04-24 MED ORDER — HEPARIN SODIUM (PORCINE) 1000 UNIT/ML IJ SOLN
INTRAMUSCULAR | Status: DC | PRN
  Administered 2023-04-24: 5000 [IU] via INTRAVENOUS
  Administered 2023-04-24: 2000 [IU] via INTRAVENOUS

## 2023-04-24 MED ORDER — MORPHINE SULFATE (PF) 4 MG/ML IV SOLN
2.0000 mg | INTRAVENOUS | Status: DC | PRN
  Administered 2023-04-29: 2 mg via INTRAVENOUS
  Administered 2023-05-01 (×2): 4 mg via INTRAVENOUS
  Filled 2023-04-24 (×3): qty 1

## 2023-04-24 MED ORDER — CEFAZOLIN SODIUM-DEXTROSE 2-4 GM/100ML-% IV SOLN
INTRAVENOUS | Status: AC
  Filled 2023-04-24: qty 100

## 2023-04-24 MED ORDER — OXYCODONE-ACETAMINOPHEN 5-325 MG PO TABS
1.0000 | ORAL_TABLET | ORAL | Status: DC | PRN
  Administered 2023-04-24: 2 via ORAL
  Administered 2023-04-25 – 2023-04-26 (×4): 1 via ORAL
  Administered 2023-04-26 – 2023-04-27 (×2): 2 via ORAL
  Administered 2023-04-27: 1 via ORAL
  Administered 2023-04-28: 2 via ORAL
  Filled 2023-04-24: qty 1
  Filled 2023-04-24 (×3): qty 2
  Filled 2023-04-24 (×2): qty 1
  Filled 2023-04-24 (×2): qty 2
  Filled 2023-04-24: qty 1
  Filled 2023-04-24: qty 2

## 2023-04-24 MED ORDER — LIDOCAINE HCL (PF) 1 % IJ SOLN
INTRAMUSCULAR | Status: DC | PRN
  Administered 2023-04-24: 10 mL

## 2023-04-24 MED ORDER — ALPRAZOLAM 0.5 MG PO TABS
0.5000 mg | ORAL_TABLET | Freq: Two times a day (BID) | ORAL | Status: DC | PRN
  Administered 2023-04-24 – 2023-05-10 (×12): 0.5 mg via ORAL
  Filled 2023-04-24 (×13): qty 1

## 2023-04-24 MED ORDER — ACETAMINOPHEN 325 MG PO TABS
325.0000 mg | ORAL_TABLET | ORAL | Status: DC | PRN
  Filled 2023-04-24: qty 2

## 2023-04-24 MED ORDER — HEPARIN BOLUS VIA INFUSION
1800.0000 [IU] | Freq: Once | INTRAVENOUS | Status: AC
  Administered 2023-04-25: 1800 [IU] via INTRAVENOUS
  Filled 2023-04-24: qty 1800

## 2023-04-24 MED ORDER — CEFAZOLIN SODIUM-DEXTROSE 2-4 GM/100ML-% IV SOLN
2.0000 g | Freq: Three times a day (TID) | INTRAVENOUS | Status: AC
  Administered 2023-04-24 – 2023-04-25 (×2): 2 g via INTRAVENOUS
  Filled 2023-04-24 (×3): qty 100

## 2023-04-24 MED ORDER — FENTANYL CITRATE PF 50 MCG/ML IJ SOSY
PREFILLED_SYRINGE | INTRAMUSCULAR | Status: AC
  Filled 2023-04-24: qty 1

## 2023-04-24 MED ORDER — CEFAZOLIN SODIUM-DEXTROSE 2-4 GM/100ML-% IV SOLN
2.0000 g | INTRAVENOUS | Status: AC
  Administered 2023-04-24: 2 g via INTRAVENOUS

## 2023-04-24 MED ORDER — NITROGLYCERIN IN D5W 200-5 MCG/ML-% IV SOLN: 5.0000 ug/min | INTRAVENOUS | Status: DC

## 2023-04-24 MED ORDER — ONDANSETRON HCL 4 MG/2ML IJ SOLN
4.0000 mg | Freq: Four times a day (QID) | INTRAMUSCULAR | Status: DC | PRN
  Administered 2023-04-29: 4 mg via INTRAVENOUS

## 2023-04-24 MED ORDER — HEPARIN (PORCINE) IN NACL 1000-0.9 UT/500ML-% IV SOLN
INTRAVENOUS | Status: DC | PRN
  Administered 2023-04-24: 1000 mL

## 2023-04-24 MED ORDER — MIDAZOLAM HCL 2 MG/2ML IJ SOLN
INTRAMUSCULAR | Status: DC | PRN
  Administered 2023-04-24 (×2): .5 mg via INTRAVENOUS
  Administered 2023-04-24 (×2): 1 mg via INTRAVENOUS
  Administered 2023-04-24: .5 mg via INTRAVENOUS
  Administered 2023-04-24: 1 mg via INTRAVENOUS
  Administered 2023-04-24: .5 mg via INTRAVENOUS

## 2023-04-24 MED ORDER — DOPAMINE-DEXTROSE 3.2-5 MG/ML-% IV SOLN: 3.0000 ug/kg/min | INTRAVENOUS | Status: DC

## 2023-04-24 MED ORDER — SODIUM CHLORIDE 0.9 % IV SOLN: INTRAVENOUS | Status: DC

## 2023-04-24 MED ORDER — HEPARIN SODIUM (PORCINE) 1000 UNIT/ML IJ SOLN
INTRAMUSCULAR | Status: AC
  Filled 2023-04-24: qty 10

## 2023-04-24 MED ORDER — TIROFIBAN HCL IV 12.5 MG/250 ML
INTRAVENOUS | Status: AC
  Filled 2023-04-24: qty 250

## 2023-04-24 MED ORDER — ASPIRIN 81 MG PO CHEW
81.0000 mg | CHEWABLE_TABLET | Freq: Every day | ORAL | Status: DC
  Administered 2023-04-24 – 2023-04-28 (×5): 81 mg via ORAL
  Filled 2023-04-24 (×5): qty 1

## 2023-04-24 MED ORDER — CHLORHEXIDINE GLUCONATE CLOTH 2 % EX PADS
6.0000 | MEDICATED_PAD | Freq: Every day | CUTANEOUS | Status: DC
  Administered 2023-04-25 – 2023-04-26 (×2): 6 via TOPICAL

## 2023-04-24 MED ORDER — INFLUENZA VIRUS VACC SPLIT PF (FLUZONE) 0.5 ML IM SUSY
0.5000 mL | PREFILLED_SYRINGE | INTRAMUSCULAR | Status: AC
  Administered 2023-04-25: 0.5 mL via INTRAMUSCULAR
  Filled 2023-04-24: qty 0.5

## 2023-04-24 MED ORDER — ATORVASTATIN CALCIUM 20 MG PO TABS
20.0000 mg | ORAL_TABLET | Freq: Every day | ORAL | Status: DC
  Administered 2023-04-25 – 2023-05-10 (×15): 20 mg via ORAL
  Filled 2023-04-24 (×17): qty 1

## 2023-04-24 MED ORDER — HYDROMORPHONE HCL 1 MG/ML IJ SOLN: 1.0000 mg | Freq: Once | INTRAMUSCULAR | Status: DC | PRN

## 2023-04-24 MED ORDER — GUAIFENESIN-DM 100-10 MG/5ML PO SYRP: 15.0000 mL | ORAL_SOLUTION | ORAL | Status: DC | PRN

## 2023-04-24 MED ORDER — FAMOTIDINE IN NACL 20-0.9 MG/50ML-% IV SOLN
20.0000 mg | Freq: Two times a day (BID) | INTRAVENOUS | Status: DC
  Administered 2023-04-24 – 2023-04-27 (×6): 20 mg via INTRAVENOUS
  Filled 2023-04-24 (×6): qty 50

## 2023-04-24 MED ORDER — METHYLPREDNISOLONE SODIUM SUCC 125 MG IJ SOLR: 125.0000 mg | Freq: Once | INTRAMUSCULAR | Status: DC | PRN

## 2023-04-24 MED ORDER — SORBITOL 70 % SOLN: 30.0000 mL | Freq: Every day | Status: DC | PRN

## 2023-04-24 MED ORDER — SODIUM CHLORIDE 0.9 % IV SOLN: 500.0000 mL | Freq: Once | INTRAVENOUS | Status: AC | PRN

## 2023-04-24 MED ORDER — SODIUM CHLORIDE 0.9 % IV SOLN
INTRAVENOUS | Status: AC | PRN
  Administered 2023-04-24: 200 mL via INTRAVENOUS

## 2023-04-24 MED ORDER — FENTANYL CITRATE (PF) 100 MCG/2ML IJ SOLN
INTRAMUSCULAR | Status: DC | PRN
  Administered 2023-04-24: 12.5 ug via INTRAVENOUS
  Administered 2023-04-24: 50 ug via INTRAVENOUS
  Administered 2023-04-24: 12.5 ug via INTRAVENOUS
  Administered 2023-04-24 (×3): 25 ug via INTRAVENOUS

## 2023-04-24 MED ORDER — ALUM & MAG HYDROXIDE-SIMETH 200-200-20 MG/5ML PO SUSP: 15.0000 mL | ORAL | Status: DC | PRN

## 2023-04-24 MED ORDER — HEPARIN (PORCINE) 25000 UT/250ML-% IV SOLN
1000.0000 [IU]/h | INTRAVENOUS | Status: DC
  Administered 2023-04-25: 1000 [IU]/h via INTRAVENOUS
  Filled 2023-04-24: qty 250

## 2023-04-24 MED ORDER — ACETAMINOPHEN 325 MG RE SUPP: 325.0000 mg | RECTAL | Status: DC | PRN

## 2023-04-24 MED ORDER — MIDAZOLAM HCL 2 MG/ML PO SYRP: 8.0000 mg | ORAL_SOLUTION | Freq: Once | ORAL | Status: DC | PRN

## 2023-04-24 MED ORDER — LABETALOL HCL 5 MG/ML IV SOLN: 10.0000 mg | INTRAVENOUS | Status: DC | PRN

## 2023-04-24 MED ORDER — ALTEPLASE 1 MG/ML SYRINGE FOR VASCULAR PROCEDURE
INTRAMUSCULAR | Status: DC | PRN
  Administered 2023-04-24: 10 mg via INTRA_ARTERIAL

## 2023-04-24 MED ORDER — POTASSIUM CHLORIDE CRYS ER 20 MEQ PO TBCR: 20.0000 meq | EXTENDED_RELEASE_TABLET | Freq: Every day | ORAL | Status: DC | PRN

## 2023-04-24 MED ORDER — HYDRALAZINE HCL 20 MG/ML IJ SOLN: 5.0000 mg | INTRAMUSCULAR | Status: DC | PRN

## 2023-04-24 MED ORDER — ONDANSETRON HCL 4 MG/2ML IJ SOLN: 4.0000 mg | Freq: Four times a day (QID) | INTRAMUSCULAR | Status: DC | PRN

## 2023-04-24 MED ORDER — MAGNESIUM SULFATE 2 GM/50ML IV SOLN: 2.0000 g | Freq: Every day | INTRAVENOUS | Status: DC | PRN

## 2023-04-24 MED ORDER — PHENOL 1.4 % MT LIQD: 1.0000 | OROMUCOSAL | Status: DC | PRN

## 2023-04-24 MED ORDER — FAMOTIDINE 20 MG PO TABS: 40.0000 mg | ORAL_TABLET | Freq: Once | ORAL | Status: DC | PRN

## 2023-04-24 MED ORDER — DIPHENHYDRAMINE HCL 50 MG/ML IJ SOLN: 50.0000 mg | Freq: Once | INTRAMUSCULAR | Status: DC | PRN

## 2023-04-24 MED ORDER — TIROFIBAN (AGGRASTAT) BOLUS VIA INFUSION
25.0000 ug/kg | Freq: Once | INTRAVENOUS | Status: AC
  Administered 2023-04-24: 1532.5 ug via INTRAVENOUS
  Filled 2023-04-24: qty 31

## 2023-04-24 MED ORDER — METOPROLOL TARTRATE 5 MG/5ML IV SOLN
2.0000 mg | INTRAVENOUS | Status: DC | PRN
  Administered 2023-05-05: 5 mg via INTRAVENOUS
  Filled 2023-04-24: qty 5

## 2023-04-24 MED ORDER — FENTANYL CITRATE (PF) 100 MCG/2ML IJ SOLN
INTRAMUSCULAR | Status: AC
  Filled 2023-04-24: qty 2

## 2023-04-24 MED ORDER — ALTEPLASE 2 MG IJ SOLR
INTRAMUSCULAR | Status: AC
  Filled 2023-04-24: qty 10

## 2023-04-24 MED ORDER — TIROFIBAN HCL IN NACL 5-0.9 MG/100ML-% IV SOLN
0.1500 ug/kg/min | INTRAVENOUS | Status: AC
  Administered 2023-04-24 – 2023-04-25 (×2): 0.15 ug/kg/min via INTRAVENOUS
  Filled 2023-04-24 (×3): qty 100

## 2023-04-24 MED ORDER — DOCUSATE SODIUM 100 MG PO CAPS
100.0000 mg | ORAL_CAPSULE | Freq: Every day | ORAL | Status: DC
  Administered 2023-04-26 – 2023-04-28 (×3): 100 mg via ORAL
  Filled 2023-04-24 (×4): qty 1

## 2023-04-24 MED ORDER — SENNOSIDES-DOCUSATE SODIUM 8.6-50 MG PO TABS: 1.0000 | ORAL_TABLET | Freq: Every evening | ORAL | Status: DC | PRN

## 2023-04-24 SURGICAL SUPPLY — 90 items
5 PAIRS OF YELLOW SUTURE CLAMP (MISCELLANEOUS) ×2
ADH SKN CLS APL DERMABOND .7 (GAUZE/BANDAGES/DRESSINGS) ×2
APL PRP STRL LF DISP 70% ISPRP (MISCELLANEOUS) ×2
APPLIER CLIP 11 MED OPEN (CLIP)
APPLIER CLIP 9.375 SM OPEN (CLIP)
APR CLP MED 11 20 MLT OPN (CLIP)
APR CLP SM 9.3 20 MLT OPN (CLIP)
BAG COUNTER SPONGE SURGICOUNT (BAG) ×1 IMPLANT
BAG DECANTER FOR FLEXI CONT (MISCELLANEOUS) ×1 IMPLANT
BAG ISL LRG 20X20 DRWSTRG (DRAPES) ×1
BAG ISOLATATION DRAPE 20X20 ST (DRAPES) ×1 IMPLANT
BAG SPNG CNTER NS LX DISP (BAG) ×1
BLADE SURG 15 STRL LF DISP TIS (BLADE) ×1 IMPLANT
BLADE SURG 15 STRL SS (BLADE) ×1
BLADE SURG SZ11 CARB STEEL (BLADE) ×1 IMPLANT
BOOT SUTURE VASCULAR YLW (MISCELLANEOUS) ×2
BRUSH SCRUB EZ 4% CHG (MISCELLANEOUS) ×1 IMPLANT
CANISTER WOUND CARE 500ML ATS (WOUND CARE) IMPLANT
CAP TUBING WOUND VAC TRAC (MISCELLANEOUS) IMPLANT
CHLORAPREP W/TINT 26 (MISCELLANEOUS) ×2 IMPLANT
CLAMP SUTURE YELLOW 5 PAIRS (MISCELLANEOUS) ×2 IMPLANT
CLIP APPLIE 11 MED OPEN (CLIP) IMPLANT
CLIP APPLIE 9.375 SM OPEN (CLIP) IMPLANT
CONNECTOR Y WND VAC (MISCELLANEOUS) IMPLANT
DERMABOND ADVANCED .7 DNX12 (GAUZE/BANDAGES/DRESSINGS) ×2 IMPLANT
DRAPE INCISE IOBAN 66X45 STRL (DRAPES) ×2 IMPLANT
DRAPE SHEET LG 3/4 BI-LAMINATE (DRAPES) ×1 IMPLANT
DRESSING SURGICEL FIBRLLR 1X2 (HEMOSTASIS) ×1 IMPLANT
DRSG OPSITE POSTOP 4X10 (GAUZE/BANDAGES/DRESSINGS) IMPLANT
DRSG OPSITE POSTOP 4X8 (GAUZE/BANDAGES/DRESSINGS) IMPLANT
DRSG SURGICEL FIBRILLAR 1X2 (HEMOSTASIS) ×1
DRSG VAC GRANUFOAM LG (GAUZE/BANDAGES/DRESSINGS) IMPLANT
ELECT CAUTERY BLADE 6.4 (BLADE) ×1 IMPLANT
ELECT REM PT RETURN 9FT ADLT (ELECTROSURGICAL) ×1
ELECTRODE REM PT RTRN 9FT ADLT (ELECTROSURGICAL) ×1 IMPLANT
GAUZE 4X4 16PLY ~~LOC~~+RFID DBL (SPONGE) ×1 IMPLANT
GEL ULTRASOUND 20GR AQUASONIC (MISCELLANEOUS) IMPLANT
GLOVE BIO SURGEON STRL SZ7 (GLOVE) ×1 IMPLANT
GLOVE SURG SYN 7.0 (GLOVE) ×2 IMPLANT
GLOVE SURG SYN 8.0 (GLOVE) ×2 IMPLANT
GOWN STRL REUS W/ TWL LRG LVL3 (GOWN DISPOSABLE) ×4 IMPLANT
GOWN STRL REUS W/ TWL XL LVL3 (GOWN DISPOSABLE) ×2 IMPLANT
GOWN STRL REUS W/TWL 2XL LVL3 (GOWN DISPOSABLE) ×1 IMPLANT
GOWN STRL REUS W/TWL LRG LVL3 (GOWN DISPOSABLE) ×4
GOWN STRL REUS W/TWL XL LVL3 (GOWN DISPOSABLE) ×2
HEAD CUTTING 'VALVULOTOME URSL (MISCELLANEOUS) IMPLANT
IV NS 500ML (IV SOLUTION) ×1
IV NS 500ML BAXH (IV SOLUTION) ×1 IMPLANT
KIT TURNOVER KIT A (KITS) ×1 IMPLANT
LABEL OR SOLS (LABEL) ×1 IMPLANT
LOOP VESSEL MAXI 1X406 RED (MISCELLANEOUS) ×3 IMPLANT
LOOP VESSEL MINI 0.8X406 BLUE (MISCELLANEOUS) ×2 IMPLANT
MANIFOLD NEPTUNE II (INSTRUMENTS) ×1 IMPLANT
NEEDLE FILTER BLUNT 18X1 1/2 (NEEDLE) ×1 IMPLANT
NS IRRIG 1000ML POUR BTL (IV SOLUTION) ×1 IMPLANT
PACK BASIN MAJOR ARMC (MISCELLANEOUS) ×1 IMPLANT
PACK UNIVERSAL (MISCELLANEOUS) ×1 IMPLANT
PAD PREP OB/GYN DISP 24X41 (PERSONAL CARE ITEMS) ×1 IMPLANT
SET WALTER ACTIVATION W/DRAPE (SET/KITS/TRAYS/PACK) ×1 IMPLANT
SPONGE T-LAP 18X18 ~~LOC~~+RFID (SPONGE) ×3 IMPLANT
STAPLER SKIN PROX 35W (STAPLE) ×1 IMPLANT
SUT ETHIBOND CT1 BRD #0 30IN (SUTURE) IMPLANT
SUT MNCRL+ 5-0 UNDYED PC-3 (SUTURE) ×1 IMPLANT
SUT PROLENE 3 0 SH DA (SUTURE) ×1 IMPLANT
SUT PROLENE 5 0 RB 1 DA (SUTURE) IMPLANT
SUT PROLENE 6 0 BV (SUTURE) ×6 IMPLANT
SUT PROLENE 7 0 BV 1 (SUTURE) ×4 IMPLANT
SUT SILK 2 0 (SUTURE) ×1
SUT SILK 2 0 SH (SUTURE) ×1 IMPLANT
SUT SILK 2-0 18XBRD TIE 12 (SUTURE) ×1 IMPLANT
SUT SILK 3 0 (SUTURE) ×1
SUT SILK 3-0 18XBRD TIE 12 (SUTURE) ×1 IMPLANT
SUT SILK 4 0 (SUTURE) ×1
SUT SILK 4-0 18XBRD TIE 12 (SUTURE) ×1 IMPLANT
SUT VIC AB 2-0 CT1 (SUTURE) ×3 IMPLANT
SUT VIC AB 2-0 SH 27 (SUTURE)
SUT VIC AB 2-0 SH 27XBRD (SUTURE) IMPLANT
SUT VIC AB 3-0 SH 27 (SUTURE) ×1
SUT VIC AB 3-0 SH 27X BRD (SUTURE) ×1 IMPLANT
SUT VICRYL+ 3-0 36IN CT-1 (SUTURE) ×3 IMPLANT
SYR 20ML LL LF (SYRINGE) ×1 IMPLANT
SYR 3ML LL SCALE MARK (SYRINGE) ×1 IMPLANT
TAG SUTURE CLAMP YLW 5PR (MISCELLANEOUS) ×2
TAPE UMBILICAL 1/8 X36 TWILL (MISCELLANEOUS) ×1 IMPLANT
TOWEL OR 17X26 4PK STRL BLUE (TOWEL DISPOSABLE) ×1 IMPLANT
TRAP FLUID SMOKE EVACUATOR (MISCELLANEOUS) ×1 IMPLANT
TRAY FOLEY MTR SLVR 16FR STAT (SET/KITS/TRAYS/PACK) ×1 IMPLANT
VALVULOTOME URESIL (MISCELLANEOUS)
WATER STERILE IRR 500ML POUR (IV SOLUTION) ×1 IMPLANT
WND VAC CAP TUBING TRAC (MISCELLANEOUS)

## 2023-04-24 SURGICAL SUPPLY — 38 items
BALLN DORADO 6X40X80 (BALLOONS) ×1
BALLN DORADO 7X40X80 (BALLOONS) ×1
BALLN LUTONIX 018 6X60X130 (BALLOONS) ×1
BALLN LUTONIX DCB 7X40X130 (BALLOONS) ×1
BALLOON DORADO 6X40X80 (BALLOONS) IMPLANT
BALLOON DORADO 7X40X80 (BALLOONS) IMPLANT
BALLOON LUTONIX 018 6X60X130 (BALLOONS) IMPLANT
BALLOON LUTONIX DCB 7X40X130 (BALLOONS) IMPLANT
CANISTER PENUMBRA ENGINE (MISCELLANEOUS) IMPLANT
CATH ANGIO 5F PIGTAIL 65CM (CATHETERS) IMPLANT
CATH BEACON 5 .035 40 KMP TP (CATHETERS) IMPLANT
CATH BEACON 5 .038 100 VERT TP (CATHETERS) IMPLANT
CATH INDIGO CAT6 KIT (CATHETERS) IMPLANT
CATH INDIGO SEP 6 (CATHETERS) IMPLANT
CATH INFUS 90CMX20CM (CATHETERS) IMPLANT
COVER PROBE ULTRASOUND 5X96 (MISCELLANEOUS) IMPLANT
DEVICE STARCLOSE SE CLOSURE (Vascular Products) IMPLANT
GLIDESHEATH SLENDER 7FR .021G (SHEATH) IMPLANT
GLIDEWIRE ADV .014X300CM (WIRE) IMPLANT
GLIDEWIRE ADV .035X260CM (WIRE) IMPLANT
GOWN STRL REUS W/ TWL LRG LVL3 (GOWN DISPOSABLE) ×1 IMPLANT
GOWN STRL REUS W/TWL LRG LVL3 (GOWN DISPOSABLE) ×1
GUIDEWIRE ANGLED .035 180CM (WIRE) IMPLANT
GUIDEWIRE PFTE-COATED .018X300 (WIRE) IMPLANT
GUIDEWIRE SUPER STIFF .035X180 (WIRE) IMPLANT
KIT ENCORE 26 ADVANTAGE (KITS) IMPLANT
NDL ENTRY 21GA 7CM ECHOTIP (NEEDLE) IMPLANT
NEEDLE ENTRY 21GA 7CM ECHOTIP (NEEDLE) ×1
PACK ANGIOGRAPHY (CUSTOM PROCEDURE TRAY) ×1 IMPLANT
SET INTRO CAPELLA COAXIAL (SET/KITS/TRAYS/PACK) IMPLANT
SHEATH BRITE TIP 5FRX11 (SHEATH) IMPLANT
SHEATH BRITE TIP 6FRX11 (SHEATH) IMPLANT
STENT LIFESTAR 8X30 (Permanent Stent) IMPLANT
STENT VIABAHN 7X50X120 (Permanent Stent) ×1 IMPLANT
STENT VIABAHN 7X5X120 7FR (Permanent Stent) IMPLANT
TUBING CONTRAST HIGH PRESS 72 (TUBING) IMPLANT
WIRE G 018X200 V18 (WIRE) IMPLANT
WIRE GUIDERIGHT .035X150 (WIRE) IMPLANT

## 2023-04-24 NOTE — Plan of Care (Signed)
[facility] Tonsillectomy/Adenoidectomy, Postoperative PEDS care plan entered in error.

## 2023-04-24 NOTE — Progress Notes (Signed)
SpO2: 98% 98%   Weight: 61.3 kg    Height: 5\' 7"  (1.702 m)     Body mass index is 21.18 kg/m. Gen: WD/WN, NAD Head: [place]/AT, No temporalis wasting.  Ear/Nose/Throat:  Hearing grossly intact, nares w/o erythema or drainage Eyes: PER, EOMI, sclera nonicteric.  Neck: Supple, no masses.  No bruit or JVD.  Pulmonary:  Good air movement, no audible wheezing, no use of accessory muscles.  Cardiac: RRR, normal S1, S2, no Murmurs. Vascular:  mild trophic changes, no open wounds Vessel Right Left  Radial Palpable Palpable  PT Not Palpable Not Palpable  DP Not Palpable Not Palpable  Gastrointestinal: soft, non-distended. No guarding/no peritoneal signs.  Musculoskeletal: M/S 5/5 throughout.  No visible deformity.  Neurologic: CN 2-12 intact. Pain and light touch intact in extremities.  Symmetrical.  Speech is fluent. Motor exam as listed above. Psychiatric: Judgment intact, Mood & affect appropriate for pt's clinical situation. Dermatologic: No rashes or ulcers noted.  No changes consistent with cellulitis.   CBC Lab Results  Component Value Date   WBC 7.7 09/08/2022   HGB 15.1 (H) 09/08/2022   HCT 43.5 09/08/2022   MCV 87.3 09/08/2022   PLT 246.0 09/08/2022    BMET    Component Value Date/Time   NA 133 (L) 01/27/2023 1134   NA 136 08/02/2021 1629   NA 135 11/02/2014 0609   K 4.8 01/27/2023 1134   K 4.2 11/02/2014 0609   CL 101 01/27/2023 1134   CL 107 11/02/2014 0609   CO2 28 01/27/2023 1134   CO2 23 11/02/2014 0609   GLUCOSE 198 (H) 01/27/2023 1134   GLUCOSE 108 (H) 11/02/2014 0609   BUN 21 04/24/2023 1140   BUN 25 08/02/2021 1629   BUN 20 11/02/2014 0609   CREATININE 0.85 04/24/2023 1140   CREATININE 1.01 11/09/2015 1549   CREATININE 1.01 11/09/2015 1549   CALCIUM 9.4 01/27/2023 1134   CALCIUM 7.3 (L) 11/02/2014 0609   GFRNONAA >60 04/24/2023 1140   GFRNONAA 50 (L) 11/02/2014 0609   GFRAA >60 01/19/2019 0357   GFRAA 58 (L) 11/02/2014 0609   Estimated Creatinine Clearance: 64.7 mL/min (by C-G formula based on SCr of 0.85 mg/dL).  COAG Lab Results  Component Value Date   INR 0.9 10/17/2014   INR 1.1 01/19/2014   INR 0.9  12/06/2013    Radiology VAS [place] LOWER EXTREMITY ARTERIAL DUPLEX  Result Date: 04/23/2023 LOWER EXTREMITY ARTERIAL DUPLEX STUDY Patient Name:  ROSSE KNIPE  Date of Exam:   04/22/2023 Medical Rec #: [id number]        Accession #:    [id number] Date of Birth: 1960/08/08        Patient Gender: F Patient Age:   62 years Exam Location:  [facility] Procedure:      VAS [place] LOWER EXTREMITY ARTERIAL DUPLEX Referring Phys: Sheppard Plumber --------------------------------------------------------------------------------  Indications: Peripheral artery disease. Other Factors: H/O right 4th & 5th and left 1st & 3rd toe amputations                12/03/2020 left pop thrombectomy and new stent.  Vascular Interventions: H/O left-to-right fem-fem BPG with lower extremity                         stents & BPGs in [place];                         10/11/09: Left  MRN : [id number]  FLORASTINE MECKES is a 62 y.o. (Mar 13, 1961) female who presents with chief complaint of check circulation.  History of Present Illness:   The patient presents to [facility] for treatment of her rest pain left lower extremity.  The patient notes that there has been a significant deterioration in the left lower extremity symptoms.  The patient notes interval shortening of their claudication distance and development of significant rest pain symptoms. No new ulcers or wounds have occurred since the last visit.  There have been no significant changes to the patient's overall health care.  The patient denies amaurosis fugax or recent TIA symptoms. There are no recent neurological changes noted. There is no history of DVT, PE or superficial thrombophlebitis. The patient denies recent episodes of angina or shortness of breath.   Duplex US of the lower extremity arterial system shows arterial inflow is patent within the left common and external iliac arteries.  The femorofemoral bypass (which is a left to right) is patent.  The left femoral-popliteal bypass appears to be occluded and there is nonvisualization of the tibial flow  Current Meds  Medication Sig   acetaminophen (TYLENOL) 500 MG tablet Take 1,000 mg by mouth every 6 (six) hours as needed for mild pain or headache.    ALPRAZolam (XANAX) 0.5 MG tablet TAKE 1 TABLET(0.5 MG) BY MOUTH DAILY AS NEEDED FOR ANXIETY   amLODipine (NORVASC) 2.5 MG tablet TAKE 1 TABLET(2.5 MG) BY MOUTH DAILY   aspirin 81 MG chewable tablet Chew 81 mg by mouth at bedtime.    atorvastatin (LIPITOR) 20 MG tablet TAKE 1 TABLET(20 MG) BY MOUTH DAILY   Cholecalciferol (VITAMIN D3) 2000 UNITS TABS Take 2,000 Units by mouth daily after supper.    clopidogrel (PLAVIX) 75 MG tablet TAKE 1 TABLET(75 MG) BY MOUTH DAILY   empagliflozin (JARDIANCE) 25 MG TABS tablet  TAKE 1 TABLET(25 MG) BY MOUTH DAILY BEFORE AND BREAKFAST   gabapentin (NEURONTIN) 800 MG tablet TAKE 1 TABLET(800 MG) BY MOUTH THREE TIMES DAILY   insulin lispro (HUMALOG KWIKPEN) 100 UNIT/ML KwikPen ADMINISTER 10 UNITS UNDER THE SKIN THREE TIMES DAILY BEFORE MEALS (Patient taking differently: ADMINISTER 10 UNITS UNDER THE SKIN THREE TIMES DAILY BEFORE MEALS PRN)   losartan (COZAAR) 100 MG tablet Take 1 tablet (100 mg total) by mouth daily.   Multiple Vitamin (MULTIVITAMIN) tablet Take 1 tablet by mouth daily.   multivitamin-lutein (OCUVITE-LUTEIN) CAPS capsule Take 1 capsule by mouth daily.   triamcinolone ointment (KENALOG) 0.5 % Apply 1 Application topically 2 (two) times daily.    Past Medical History:  Diagnosis Date   Absence of kidney    left   Anxiety    Arthritis    Bladder cancer (HCC)    CHF (congestive heart failure) (HCC)    Complication of anesthesia    BP HAS  RUN LOW AFTER SURGERY-LUNGS FILLED UP WITH FLUID AFTER  LEG STENT SURGERY    Coronary artery disease    Diabetes mellitus    Family history of adverse reaction to anesthesia    Sister - PONV  MRN : [id number]  FLORASTINE MECKES is a 62 y.o. (Mar 13, 1961) female who presents with chief complaint of check circulation.  History of Present Illness:   The patient presents to [facility] for treatment of her rest pain left lower extremity.  The patient notes that there has been a significant deterioration in the left lower extremity symptoms.  The patient notes interval shortening of their claudication distance and development of significant rest pain symptoms. No new ulcers or wounds have occurred since the last visit.  There have been no significant changes to the patient's overall health care.  The patient denies amaurosis fugax or recent TIA symptoms. There are no recent neurological changes noted. There is no history of DVT, PE or superficial thrombophlebitis. The patient denies recent episodes of angina or shortness of breath.   Duplex US of the lower extremity arterial system shows arterial inflow is patent within the left common and external iliac arteries.  The femorofemoral bypass (which is a left to right) is patent.  The left femoral-popliteal bypass appears to be occluded and there is nonvisualization of the tibial flow  Current Meds  Medication Sig   acetaminophen (TYLENOL) 500 MG tablet Take 1,000 mg by mouth every 6 (six) hours as needed for mild pain or headache.    ALPRAZolam (XANAX) 0.5 MG tablet TAKE 1 TABLET(0.5 MG) BY MOUTH DAILY AS NEEDED FOR ANXIETY   amLODipine (NORVASC) 2.5 MG tablet TAKE 1 TABLET(2.5 MG) BY MOUTH DAILY   aspirin 81 MG chewable tablet Chew 81 mg by mouth at bedtime.    atorvastatin (LIPITOR) 20 MG tablet TAKE 1 TABLET(20 MG) BY MOUTH DAILY   Cholecalciferol (VITAMIN D3) 2000 UNITS TABS Take 2,000 Units by mouth daily after supper.    clopidogrel (PLAVIX) 75 MG tablet TAKE 1 TABLET(75 MG) BY MOUTH DAILY   empagliflozin (JARDIANCE) 25 MG TABS tablet  TAKE 1 TABLET(25 MG) BY MOUTH DAILY BEFORE AND BREAKFAST   gabapentin (NEURONTIN) 800 MG tablet TAKE 1 TABLET(800 MG) BY MOUTH THREE TIMES DAILY   insulin lispro (HUMALOG KWIKPEN) 100 UNIT/ML KwikPen ADMINISTER 10 UNITS UNDER THE SKIN THREE TIMES DAILY BEFORE MEALS (Patient taking differently: ADMINISTER 10 UNITS UNDER THE SKIN THREE TIMES DAILY BEFORE MEALS PRN)   losartan (COZAAR) 100 MG tablet Take 1 tablet (100 mg total) by mouth daily.   Multiple Vitamin (MULTIVITAMIN) tablet Take 1 tablet by mouth daily.   multivitamin-lutein (OCUVITE-LUTEIN) CAPS capsule Take 1 capsule by mouth daily.   triamcinolone ointment (KENALOG) 0.5 % Apply 1 Application topically 2 (two) times daily.    Past Medical History:  Diagnosis Date   Absence of kidney    left   Anxiety    Arthritis    Bladder cancer (HCC)    CHF (congestive heart failure) (HCC)    Complication of anesthesia    BP HAS  RUN LOW AFTER SURGERY-LUNGS FILLED UP WITH FLUID AFTER  LEG STENT SURGERY    Coronary artery disease    Diabetes mellitus    Family history of adverse reaction to anesthesia    Sister - PONV  MRN : [id number]  FLORASTINE MECKES is a 62 y.o. (Mar 13, 1961) female who presents with chief complaint of check circulation.  History of Present Illness:   The patient presents to [facility] for treatment of her rest pain left lower extremity.  The patient notes that there has been a significant deterioration in the left lower extremity symptoms.  The patient notes interval shortening of their claudication distance and development of significant rest pain symptoms. No new ulcers or wounds have occurred since the last visit.  There have been no significant changes to the patient's overall health care.  The patient denies amaurosis fugax or recent TIA symptoms. There are no recent neurological changes noted. There is no history of DVT, PE or superficial thrombophlebitis. The patient denies recent episodes of angina or shortness of breath.   Duplex US of the lower extremity arterial system shows arterial inflow is patent within the left common and external iliac arteries.  The femorofemoral bypass (which is a left to right) is patent.  The left femoral-popliteal bypass appears to be occluded and there is nonvisualization of the tibial flow  Current Meds  Medication Sig   acetaminophen (TYLENOL) 500 MG tablet Take 1,000 mg by mouth every 6 (six) hours as needed for mild pain or headache.    ALPRAZolam (XANAX) 0.5 MG tablet TAKE 1 TABLET(0.5 MG) BY MOUTH DAILY AS NEEDED FOR ANXIETY   amLODipine (NORVASC) 2.5 MG tablet TAKE 1 TABLET(2.5 MG) BY MOUTH DAILY   aspirin 81 MG chewable tablet Chew 81 mg by mouth at bedtime.    atorvastatin (LIPITOR) 20 MG tablet TAKE 1 TABLET(20 MG) BY MOUTH DAILY   Cholecalciferol (VITAMIN D3) 2000 UNITS TABS Take 2,000 Units by mouth daily after supper.    clopidogrel (PLAVIX) 75 MG tablet TAKE 1 TABLET(75 MG) BY MOUTH DAILY   empagliflozin (JARDIANCE) 25 MG TABS tablet  TAKE 1 TABLET(25 MG) BY MOUTH DAILY BEFORE AND BREAKFAST   gabapentin (NEURONTIN) 800 MG tablet TAKE 1 TABLET(800 MG) BY MOUTH THREE TIMES DAILY   insulin lispro (HUMALOG KWIKPEN) 100 UNIT/ML KwikPen ADMINISTER 10 UNITS UNDER THE SKIN THREE TIMES DAILY BEFORE MEALS (Patient taking differently: ADMINISTER 10 UNITS UNDER THE SKIN THREE TIMES DAILY BEFORE MEALS PRN)   losartan (COZAAR) 100 MG tablet Take 1 tablet (100 mg total) by mouth daily.   Multiple Vitamin (MULTIVITAMIN) tablet Take 1 tablet by mouth daily.   multivitamin-lutein (OCUVITE-LUTEIN) CAPS capsule Take 1 capsule by mouth daily.   triamcinolone ointment (KENALOG) 0.5 % Apply 1 Application topically 2 (two) times daily.    Past Medical History:  Diagnosis Date   Absence of kidney    left   Anxiety    Arthritis    Bladder cancer (HCC)    CHF (congestive heart failure) (HCC)    Complication of anesthesia    BP HAS  RUN LOW AFTER SURGERY-LUNGS FILLED UP WITH FLUID AFTER  LEG STENT SURGERY    Coronary artery disease    Diabetes mellitus    Family history of adverse reaction to anesthesia    Sister - PONV  MRN : [id number]  FLORASTINE MECKES is a 62 y.o. (Mar 13, 1961) female who presents with chief complaint of check circulation.  History of Present Illness:   The patient presents to [facility] for treatment of her rest pain left lower extremity.  The patient notes that there has been a significant deterioration in the left lower extremity symptoms.  The patient notes interval shortening of their claudication distance and development of significant rest pain symptoms. No new ulcers or wounds have occurred since the last visit.  There have been no significant changes to the patient's overall health care.  The patient denies amaurosis fugax or recent TIA symptoms. There are no recent neurological changes noted. There is no history of DVT, PE or superficial thrombophlebitis. The patient denies recent episodes of angina or shortness of breath.   Duplex US of the lower extremity arterial system shows arterial inflow is patent within the left common and external iliac arteries.  The femorofemoral bypass (which is a left to right) is patent.  The left femoral-popliteal bypass appears to be occluded and there is nonvisualization of the tibial flow  Current Meds  Medication Sig   acetaminophen (TYLENOL) 500 MG tablet Take 1,000 mg by mouth every 6 (six) hours as needed for mild pain or headache.    ALPRAZolam (XANAX) 0.5 MG tablet TAKE 1 TABLET(0.5 MG) BY MOUTH DAILY AS NEEDED FOR ANXIETY   amLODipine (NORVASC) 2.5 MG tablet TAKE 1 TABLET(2.5 MG) BY MOUTH DAILY   aspirin 81 MG chewable tablet Chew 81 mg by mouth at bedtime.    atorvastatin (LIPITOR) 20 MG tablet TAKE 1 TABLET(20 MG) BY MOUTH DAILY   Cholecalciferol (VITAMIN D3) 2000 UNITS TABS Take 2,000 Units by mouth daily after supper.    clopidogrel (PLAVIX) 75 MG tablet TAKE 1 TABLET(75 MG) BY MOUTH DAILY   empagliflozin (JARDIANCE) 25 MG TABS tablet  TAKE 1 TABLET(25 MG) BY MOUTH DAILY BEFORE AND BREAKFAST   gabapentin (NEURONTIN) 800 MG tablet TAKE 1 TABLET(800 MG) BY MOUTH THREE TIMES DAILY   insulin lispro (HUMALOG KWIKPEN) 100 UNIT/ML KwikPen ADMINISTER 10 UNITS UNDER THE SKIN THREE TIMES DAILY BEFORE MEALS (Patient taking differently: ADMINISTER 10 UNITS UNDER THE SKIN THREE TIMES DAILY BEFORE MEALS PRN)   losartan (COZAAR) 100 MG tablet Take 1 tablet (100 mg total) by mouth daily.   Multiple Vitamin (MULTIVITAMIN) tablet Take 1 tablet by mouth daily.   multivitamin-lutein (OCUVITE-LUTEIN) CAPS capsule Take 1 capsule by mouth daily.   triamcinolone ointment (KENALOG) 0.5 % Apply 1 Application topically 2 (two) times daily.    Past Medical History:  Diagnosis Date   Absence of kidney    left   Anxiety    Arthritis    Bladder cancer (HCC)    CHF (congestive heart failure) (HCC)    Complication of anesthesia    BP HAS  RUN LOW AFTER SURGERY-LUNGS FILLED UP WITH FLUID AFTER  LEG STENT SURGERY    Coronary artery disease    Diabetes mellitus    Family history of adverse reaction to anesthesia    Sister - PONV  MRN : [id number]  FLORASTINE MECKES is a 62 y.o. (Mar 13, 1961) female who presents with chief complaint of check circulation.  History of Present Illness:   The patient presents to [facility] for treatment of her rest pain left lower extremity.  The patient notes that there has been a significant deterioration in the left lower extremity symptoms.  The patient notes interval shortening of their claudication distance and development of significant rest pain symptoms. No new ulcers or wounds have occurred since the last visit.  There have been no significant changes to the patient's overall health care.  The patient denies amaurosis fugax or recent TIA symptoms. There are no recent neurological changes noted. There is no history of DVT, PE or superficial thrombophlebitis. The patient denies recent episodes of angina or shortness of breath.   Duplex US of the lower extremity arterial system shows arterial inflow is patent within the left common and external iliac arteries.  The femorofemoral bypass (which is a left to right) is patent.  The left femoral-popliteal bypass appears to be occluded and there is nonvisualization of the tibial flow  Current Meds  Medication Sig   acetaminophen (TYLENOL) 500 MG tablet Take 1,000 mg by mouth every 6 (six) hours as needed for mild pain or headache.    ALPRAZolam (XANAX) 0.5 MG tablet TAKE 1 TABLET(0.5 MG) BY MOUTH DAILY AS NEEDED FOR ANXIETY   amLODipine (NORVASC) 2.5 MG tablet TAKE 1 TABLET(2.5 MG) BY MOUTH DAILY   aspirin 81 MG chewable tablet Chew 81 mg by mouth at bedtime.    atorvastatin (LIPITOR) 20 MG tablet TAKE 1 TABLET(20 MG) BY MOUTH DAILY   Cholecalciferol (VITAMIN D3) 2000 UNITS TABS Take 2,000 Units by mouth daily after supper.    clopidogrel (PLAVIX) 75 MG tablet TAKE 1 TABLET(75 MG) BY MOUTH DAILY   empagliflozin (JARDIANCE) 25 MG TABS tablet  TAKE 1 TABLET(25 MG) BY MOUTH DAILY BEFORE AND BREAKFAST   gabapentin (NEURONTIN) 800 MG tablet TAKE 1 TABLET(800 MG) BY MOUTH THREE TIMES DAILY   insulin lispro (HUMALOG KWIKPEN) 100 UNIT/ML KwikPen ADMINISTER 10 UNITS UNDER THE SKIN THREE TIMES DAILY BEFORE MEALS (Patient taking differently: ADMINISTER 10 UNITS UNDER THE SKIN THREE TIMES DAILY BEFORE MEALS PRN)   losartan (COZAAR) 100 MG tablet Take 1 tablet (100 mg total) by mouth daily.   Multiple Vitamin (MULTIVITAMIN) tablet Take 1 tablet by mouth daily.   multivitamin-lutein (OCUVITE-LUTEIN) CAPS capsule Take 1 capsule by mouth daily.   triamcinolone ointment (KENALOG) 0.5 % Apply 1 Application topically 2 (two) times daily.    Past Medical History:  Diagnosis Date   Absence of kidney    left   Anxiety    Arthritis    Bladder cancer (HCC)    CHF (congestive heart failure) (HCC)    Complication of anesthesia    BP HAS  RUN LOW AFTER SURGERY-LUNGS FILLED UP WITH FLUID AFTER  LEG STENT SURGERY    Coronary artery disease    Diabetes mellitus    Family history of adverse reaction to anesthesia    Sister - PONV

## 2023-04-24 NOTE — Progress Notes (Signed)
PHARMACY - ANTICOAGULATION CONSULT NOTE  Pharmacy Consult for Heparin Infusion and Aggrastat Infusion Indication:  Post lower extremity thrombectomy  No Known Allergies  Patient Measurements: Height: 5\' 7"  (170.2 cm) Weight: 61.2 kg (134 lb 14.7 oz) IBW/kg (Calculated) : 61.6 Heparin Dosing Weight: 61.2 kg  Vital Signs: Temp: 97.5 F (36.4 C) (10/18 1718) Temp Source: Oral (10/18 1718) BP: 99/59 (10/18 1732) Pulse Rate: 81 (10/18 1732)  Labs: Recent Labs    04/24/23 1140  CREATININE 0.85    Estimated Creatinine Clearance: 64.6 mL/min (by C-G formula based on SCr of 0.85 mg/dL).   Medical History: Past Medical History:  Diagnosis Date   Absence of kidney    left   Anxiety    Arthritis    Bladder cancer (HCC)    CHF (congestive heart failure) (HCC)    Complication of anesthesia    BP HAS  RUN LOW AFTER SURGERY-LUNGS FILLED UP WITH FLUID AFTER  LEG STENT SURGERY    Coronary artery disease    Diabetes mellitus    Family history of adverse reaction to anesthesia    Sister - PONV   GERD (gastroesophageal reflux disease)    OCC TUMS   Heart murmur    Hemorrhoid    History of methicillin resistant staphylococcus aureus (MRSA) 2007   Hypertension    Neuropathy    PVD (peripheral vascular disease) (HCC)    Thyroid nodule    right   Urothelial carcinoma of kidney (HCC) 10/31/2014   INVASIVE UROTHELIAL CARCINOMA, LOW GRADE. T1, Nx.   Wears dentures    full upper and lower    Assessment: Patient is a 62yo female with left lower extremity ischemia, she is s/p mechanical thrombectomy. Pharmacy consulted for Aggrastat dosing to run until 10/19 07:00 followed by Heparin Infusion  Goal of Therapy:  Heparin level 0.3-0.7 units/ml Monitor platelets by anticoagulation protocol: Yes   Plan:  -Aggrastat 0.15 mcg/kg/min (53ml/hr) to run until 10/19 at 0700 -Transition to Heparin infusion at 0700 when Aggrastat is stopped. - Heparin 1800 units IV bolus (half bolus) -  Heparin infusion at 1000 units/hr - Check HL 6 hours after starting Heparin infusion - Daily CBC while on Heparin infusion  Clovia Cuff, PharmD, BCPS 04/24/2023 5:59 PM

## 2023-04-24 NOTE — Interval H&P Note (Signed)
History and Physical Interval Note:  04/24/2023 1:18 PM  Laura Reed  has presented today for surgery, with the diagnosis of LLE Angio   Ischemic leg.  The various methods of treatment have been discussed with the patient and family. After consideration of risks, benefits and other options for treatment, the patient has consented to  Procedure(s): Lower Extremity Angiography (Left) as a surgical intervention.  The patient's history has been reviewed, patient examined, no change in status, stable for surgery.  I have reviewed the patient's chart and labs.  Questions were answered to the patient's satisfaction.     [name]

## 2023-04-24 NOTE — H&P (View-Only) (Signed)
MRN : [id number]  Laura Reed is a 62 y.o. (Mar 13, 1961) female who presents with chief complaint of check circulation.  History of Present Illness:   The patient presents to [facility] for treatment of her rest pain left lower extremity.  The patient notes that there has been a significant deterioration in the left lower extremity symptoms.  The patient notes interval shortening of their claudication distance and development of significant rest pain symptoms. No new ulcers or wounds have occurred since the last visit.  There have been no significant changes to the patient's overall health care.  The patient denies amaurosis fugax or recent TIA symptoms. There are no recent neurological changes noted. There is no history of DVT, PE or superficial thrombophlebitis. The patient denies recent episodes of angina or shortness of breath.   Duplex US of the lower extremity arterial system shows arterial inflow is patent within the left common and external iliac arteries.  The femorofemoral bypass (which is a left to right) is patent.  The left femoral-popliteal bypass appears to be occluded and there is nonvisualization of the tibial flow  Current Meds  Medication Sig   acetaminophen (TYLENOL) 500 MG tablet Take 1,000 mg by mouth every 6 (six) hours as needed for mild pain or headache.    ALPRAZolam (XANAX) 0.5 MG tablet TAKE 1 TABLET(0.5 MG) BY MOUTH DAILY AS NEEDED FOR ANXIETY   amLODipine (NORVASC) 2.5 MG tablet TAKE 1 TABLET(2.5 MG) BY MOUTH DAILY   aspirin 81 MG chewable tablet Chew 81 mg by mouth at bedtime.    atorvastatin (LIPITOR) 20 MG tablet TAKE 1 TABLET(20 MG) BY MOUTH DAILY   Cholecalciferol (VITAMIN D3) 2000 UNITS TABS Take 2,000 Units by mouth daily after supper.    clopidogrel (PLAVIX) 75 MG tablet TAKE 1 TABLET(75 MG) BY MOUTH DAILY   empagliflozin (JARDIANCE) 25 MG TABS tablet  TAKE 1 TABLET(25 MG) BY MOUTH DAILY BEFORE AND BREAKFAST   gabapentin (NEURONTIN) 800 MG tablet TAKE 1 TABLET(800 MG) BY MOUTH THREE TIMES DAILY   insulin lispro (HUMALOG KWIKPEN) 100 UNIT/ML KwikPen ADMINISTER 10 UNITS UNDER THE SKIN THREE TIMES DAILY BEFORE MEALS (Patient taking differently: ADMINISTER 10 UNITS UNDER THE SKIN THREE TIMES DAILY BEFORE MEALS PRN)   losartan (COZAAR) 100 MG tablet Take 1 tablet (100 mg total) by mouth daily.   Multiple Vitamin (MULTIVITAMIN) tablet Take 1 tablet by mouth daily.   multivitamin-lutein (OCUVITE-LUTEIN) CAPS capsule Take 1 capsule by mouth daily.   triamcinolone ointment (KENALOG) 0.5 % Apply 1 Application topically 2 (two) times daily.    Past Medical History:  Diagnosis Date   Absence of kidney    left   Anxiety    Arthritis    Bladder cancer (HCC)    CHF (congestive heart failure) (HCC)    Complication of anesthesia    BP HAS  RUN LOW AFTER SURGERY-LUNGS FILLED UP WITH FLUID AFTER  LEG STENT SURGERY    Coronary artery disease    Diabetes mellitus    Family history of adverse reaction to anesthesia    Sister - PONV  SpO2: 98% 98%   Weight: 61.3 kg    Height: 5\' 7"  (1.702 m)     Body mass index is 21.18 kg/m. Gen: WD/WN, NAD Head: [place]/AT, No temporalis wasting.  Ear/Nose/Throat:  Hearing grossly intact, nares w/o erythema or drainage Eyes: PER, EOMI, sclera nonicteric.  Neck: Supple, no masses.  No bruit or JVD.  Pulmonary:  Good air movement, no audible wheezing, no use of accessory muscles.  Cardiac: RRR, normal S1, S2, no Murmurs. Vascular:  mild trophic changes, no open wounds Vessel Right Left  Radial Palpable Palpable  PT Not Palpable Not Palpable  DP Not Palpable Not Palpable  Gastrointestinal: soft, non-distended. No guarding/no peritoneal signs.  Musculoskeletal: M/S 5/5 throughout.  No visible deformity.  Neurologic: CN 2-12 intact. Pain and light touch intact in extremities.  Symmetrical.  Speech is fluent. Motor exam as listed above. Psychiatric: Judgment intact, Mood & affect appropriate for pt's clinical situation. Dermatologic: No rashes or ulcers noted.  No changes consistent with cellulitis.   CBC Lab Results  Component Value Date   WBC 7.7 09/08/2022   HGB 15.1 (H) 09/08/2022   HCT 43.5 09/08/2022   MCV 87.3 09/08/2022   PLT 246.0 09/08/2022    BMET    Component Value Date/Time   NA 133 (L) 01/27/2023 1134   NA 136 08/02/2021 1629   NA 135 11/02/2014 0609   K 4.8 01/27/2023 1134   K 4.2 11/02/2014 0609   CL 101 01/27/2023 1134   CL 107 11/02/2014 0609   CO2 28 01/27/2023 1134   CO2 23 11/02/2014 0609   GLUCOSE 198 (H) 01/27/2023 1134   GLUCOSE 108 (H) 11/02/2014 0609   BUN 21 04/24/2023 1140   BUN 25 08/02/2021 1629   BUN 20 11/02/2014 0609   CREATININE 0.85 04/24/2023 1140   CREATININE 1.01 11/09/2015 1549   CREATININE 1.01 11/09/2015 1549   CALCIUM 9.4 01/27/2023 1134   CALCIUM 7.3 (L) 11/02/2014 0609   GFRNONAA >60 04/24/2023 1140   GFRNONAA 50 (L) 11/02/2014 0609   GFRAA >60 01/19/2019 0357   GFRAA 58 (L) 11/02/2014 0609   Estimated Creatinine Clearance: 64.7 mL/min (by C-G formula based on SCr of 0.85 mg/dL).  COAG Lab Results  Component Value Date   INR 0.9 10/17/2014   INR 1.1 01/19/2014   INR 0.9  12/06/2013    Radiology VAS [place] LOWER EXTREMITY ARTERIAL DUPLEX  Result Date: 04/23/2023 LOWER EXTREMITY ARTERIAL DUPLEX STUDY Patient Name:  Laura Reed  Date of Exam:   04/22/2023 Medical Rec #: [id number]        Accession #:    [id number] Date of Birth: 1960/08/08        Patient Gender: F Patient Age:   65 years Exam Location:  [facility] Procedure:      VAS [place] LOWER EXTREMITY ARTERIAL DUPLEX Referring Phys: Sheppard Plumber --------------------------------------------------------------------------------  Indications: Peripheral artery disease. Other Factors: H/O right 4th & 5th and left 1st & 3rd toe amputations                12/03/2020 left pop thrombectomy and new stent.  Vascular Interventions: H/O left-to-right fem-fem BPG with lower extremity                         stents & BPGs in [place];                         10/11/09: Left  SpO2: 98% 98%   Weight: 61.3 kg    Height: 5\' 7"  (1.702 m)     Body mass index is 21.18 kg/m. Gen: WD/WN, NAD Head: [place]/AT, No temporalis wasting.  Ear/Nose/Throat:  Hearing grossly intact, nares w/o erythema or drainage Eyes: PER, EOMI, sclera nonicteric.  Neck: Supple, no masses.  No bruit or JVD.  Pulmonary:  Good air movement, no audible wheezing, no use of accessory muscles.  Cardiac: RRR, normal S1, S2, no Murmurs. Vascular:  mild trophic changes, no open wounds Vessel Right Left  Radial Palpable Palpable  PT Not Palpable Not Palpable  DP Not Palpable Not Palpable  Gastrointestinal: soft, non-distended. No guarding/no peritoneal signs.  Musculoskeletal: M/S 5/5 throughout.  No visible deformity.  Neurologic: CN 2-12 intact. Pain and light touch intact in extremities.  Symmetrical.  Speech is fluent. Motor exam as listed above. Psychiatric: Judgment intact, Mood & affect appropriate for pt's clinical situation. Dermatologic: No rashes or ulcers noted.  No changes consistent with cellulitis.   CBC Lab Results  Component Value Date   WBC 7.7 09/08/2022   HGB 15.1 (H) 09/08/2022   HCT 43.5 09/08/2022   MCV 87.3 09/08/2022   PLT 246.0 09/08/2022    BMET    Component Value Date/Time   NA 133 (L) 01/27/2023 1134   NA 136 08/02/2021 1629   NA 135 11/02/2014 0609   K 4.8 01/27/2023 1134   K 4.2 11/02/2014 0609   CL 101 01/27/2023 1134   CL 107 11/02/2014 0609   CO2 28 01/27/2023 1134   CO2 23 11/02/2014 0609   GLUCOSE 198 (H) 01/27/2023 1134   GLUCOSE 108 (H) 11/02/2014 0609   BUN 21 04/24/2023 1140   BUN 25 08/02/2021 1629   BUN 20 11/02/2014 0609   CREATININE 0.85 04/24/2023 1140   CREATININE 1.01 11/09/2015 1549   CREATININE 1.01 11/09/2015 1549   CALCIUM 9.4 01/27/2023 1134   CALCIUM 7.3 (L) 11/02/2014 0609   GFRNONAA >60 04/24/2023 1140   GFRNONAA 50 (L) 11/02/2014 0609   GFRAA >60 01/19/2019 0357   GFRAA 58 (L) 11/02/2014 0609   Estimated Creatinine Clearance: 64.7 mL/min (by C-G formula based on SCr of 0.85 mg/dL).  COAG Lab Results  Component Value Date   INR 0.9 10/17/2014   INR 1.1 01/19/2014   INR 0.9  12/06/2013    Radiology VAS [place] LOWER EXTREMITY ARTERIAL DUPLEX  Result Date: 04/23/2023 LOWER EXTREMITY ARTERIAL DUPLEX STUDY Patient Name:  Laura Reed  Date of Exam:   04/22/2023 Medical Rec #: [id number]        Accession #:    [id number] Date of Birth: 1960/08/08        Patient Gender: F Patient Age:   65 years Exam Location:  [facility] Procedure:      VAS [place] LOWER EXTREMITY ARTERIAL DUPLEX Referring Phys: Sheppard Plumber --------------------------------------------------------------------------------  Indications: Peripheral artery disease. Other Factors: H/O right 4th & 5th and left 1st & 3rd toe amputations                12/03/2020 left pop thrombectomy and new stent.  Vascular Interventions: H/O left-to-right fem-fem BPG with lower extremity                         stents & BPGs in [place];                         10/11/09: Left  SpO2: 98% 98%   Weight: 61.3 kg    Height: 5\' 7"  (1.702 m)     Body mass index is 21.18 kg/m. Gen: WD/WN, NAD Head: [place]/AT, No temporalis wasting.  Ear/Nose/Throat:  Hearing grossly intact, nares w/o erythema or drainage Eyes: PER, EOMI, sclera nonicteric.  Neck: Supple, no masses.  No bruit or JVD.  Pulmonary:  Good air movement, no audible wheezing, no use of accessory muscles.  Cardiac: RRR, normal S1, S2, no Murmurs. Vascular:  mild trophic changes, no open wounds Vessel Right Left  Radial Palpable Palpable  PT Not Palpable Not Palpable  DP Not Palpable Not Palpable  Gastrointestinal: soft, non-distended. No guarding/no peritoneal signs.  Musculoskeletal: M/S 5/5 throughout.  No visible deformity.  Neurologic: CN 2-12 intact. Pain and light touch intact in extremities.  Symmetrical.  Speech is fluent. Motor exam as listed above. Psychiatric: Judgment intact, Mood & affect appropriate for pt's clinical situation. Dermatologic: No rashes or ulcers noted.  No changes consistent with cellulitis.   CBC Lab Results  Component Value Date   WBC 7.7 09/08/2022   HGB 15.1 (H) 09/08/2022   HCT 43.5 09/08/2022   MCV 87.3 09/08/2022   PLT 246.0 09/08/2022    BMET    Component Value Date/Time   NA 133 (L) 01/27/2023 1134   NA 136 08/02/2021 1629   NA 135 11/02/2014 0609   K 4.8 01/27/2023 1134   K 4.2 11/02/2014 0609   CL 101 01/27/2023 1134   CL 107 11/02/2014 0609   CO2 28 01/27/2023 1134   CO2 23 11/02/2014 0609   GLUCOSE 198 (H) 01/27/2023 1134   GLUCOSE 108 (H) 11/02/2014 0609   BUN 21 04/24/2023 1140   BUN 25 08/02/2021 1629   BUN 20 11/02/2014 0609   CREATININE 0.85 04/24/2023 1140   CREATININE 1.01 11/09/2015 1549   CREATININE 1.01 11/09/2015 1549   CALCIUM 9.4 01/27/2023 1134   CALCIUM 7.3 (L) 11/02/2014 0609   GFRNONAA >60 04/24/2023 1140   GFRNONAA 50 (L) 11/02/2014 0609   GFRAA >60 01/19/2019 0357   GFRAA 58 (L) 11/02/2014 0609   Estimated Creatinine Clearance: 64.7 mL/min (by C-G formula based on SCr of 0.85 mg/dL).  COAG Lab Results  Component Value Date   INR 0.9 10/17/2014   INR 1.1 01/19/2014   INR 0.9  12/06/2013    Radiology VAS [place] LOWER EXTREMITY ARTERIAL DUPLEX  Result Date: 04/23/2023 LOWER EXTREMITY ARTERIAL DUPLEX STUDY Patient Name:  Laura Reed  Date of Exam:   04/22/2023 Medical Rec #: [id number]        Accession #:    [id number] Date of Birth: 1960/08/08        Patient Gender: F Patient Age:   65 years Exam Location:  [facility] Procedure:      VAS [place] LOWER EXTREMITY ARTERIAL DUPLEX Referring Phys: Sheppard Plumber --------------------------------------------------------------------------------  Indications: Peripheral artery disease. Other Factors: H/O right 4th & 5th and left 1st & 3rd toe amputations                12/03/2020 left pop thrombectomy and new stent.  Vascular Interventions: H/O left-to-right fem-fem BPG with lower extremity                         stents & BPGs in [place];                         10/11/09: Left  SpO2: 98% 98%   Weight: 61.3 kg    Height: 5\' 7"  (1.702 m)     Body mass index is 21.18 kg/m. Gen: WD/WN, NAD Head: [place]/AT, No temporalis wasting.  Ear/Nose/Throat:  Hearing grossly intact, nares w/o erythema or drainage Eyes: PER, EOMI, sclera nonicteric.  Neck: Supple, no masses.  No bruit or JVD.  Pulmonary:  Good air movement, no audible wheezing, no use of accessory muscles.  Cardiac: RRR, normal S1, S2, no Murmurs. Vascular:  mild trophic changes, no open wounds Vessel Right Left  Radial Palpable Palpable  PT Not Palpable Not Palpable  DP Not Palpable Not Palpable  Gastrointestinal: soft, non-distended. No guarding/no peritoneal signs.  Musculoskeletal: M/S 5/5 throughout.  No visible deformity.  Neurologic: CN 2-12 intact. Pain and light touch intact in extremities.  Symmetrical.  Speech is fluent. Motor exam as listed above. Psychiatric: Judgment intact, Mood & affect appropriate for pt's clinical situation. Dermatologic: No rashes or ulcers noted.  No changes consistent with cellulitis.   CBC Lab Results  Component Value Date   WBC 7.7 09/08/2022   HGB 15.1 (H) 09/08/2022   HCT 43.5 09/08/2022   MCV 87.3 09/08/2022   PLT 246.0 09/08/2022    BMET    Component Value Date/Time   NA 133 (L) 01/27/2023 1134   NA 136 08/02/2021 1629   NA 135 11/02/2014 0609   K 4.8 01/27/2023 1134   K 4.2 11/02/2014 0609   CL 101 01/27/2023 1134   CL 107 11/02/2014 0609   CO2 28 01/27/2023 1134   CO2 23 11/02/2014 0609   GLUCOSE 198 (H) 01/27/2023 1134   GLUCOSE 108 (H) 11/02/2014 0609   BUN 21 04/24/2023 1140   BUN 25 08/02/2021 1629   BUN 20 11/02/2014 0609   CREATININE 0.85 04/24/2023 1140   CREATININE 1.01 11/09/2015 1549   CREATININE 1.01 11/09/2015 1549   CALCIUM 9.4 01/27/2023 1134   CALCIUM 7.3 (L) 11/02/2014 0609   GFRNONAA >60 04/24/2023 1140   GFRNONAA 50 (L) 11/02/2014 0609   GFRAA >60 01/19/2019 0357   GFRAA 58 (L) 11/02/2014 0609   Estimated Creatinine Clearance: 64.7 mL/min (by C-G formula based on SCr of 0.85 mg/dL).  COAG Lab Results  Component Value Date   INR 0.9 10/17/2014   INR 1.1 01/19/2014   INR 0.9  12/06/2013    Radiology VAS [place] LOWER EXTREMITY ARTERIAL DUPLEX  Result Date: 04/23/2023 LOWER EXTREMITY ARTERIAL DUPLEX STUDY Patient Name:  Laura Reed  Date of Exam:   04/22/2023 Medical Rec #: [id number]        Accession #:    [id number] Date of Birth: 1960/08/08        Patient Gender: F Patient Age:   65 years Exam Location:  [facility] Procedure:      VAS [place] LOWER EXTREMITY ARTERIAL DUPLEX Referring Phys: Sheppard Plumber --------------------------------------------------------------------------------  Indications: Peripheral artery disease. Other Factors: H/O right 4th & 5th and left 1st & 3rd toe amputations                12/03/2020 left pop thrombectomy and new stent.  Vascular Interventions: H/O left-to-right fem-fem BPG with lower extremity                         stents & BPGs in [place];                         10/11/09: Left  SpO2: 98% 98%   Weight: 61.3 kg    Height: 5\' 7"  (1.702 m)     Body mass index is 21.18 kg/m. Gen: WD/WN, NAD Head: [place]/AT, No temporalis wasting.  Ear/Nose/Throat:  Hearing grossly intact, nares w/o erythema or drainage Eyes: PER, EOMI, sclera nonicteric.  Neck: Supple, no masses.  No bruit or JVD.  Pulmonary:  Good air movement, no audible wheezing, no use of accessory muscles.  Cardiac: RRR, normal S1, S2, no Murmurs. Vascular:  mild trophic changes, no open wounds Vessel Right Left  Radial Palpable Palpable  PT Not Palpable Not Palpable  DP Not Palpable Not Palpable  Gastrointestinal: soft, non-distended. No guarding/no peritoneal signs.  Musculoskeletal: M/S 5/5 throughout.  No visible deformity.  Neurologic: CN 2-12 intact. Pain and light touch intact in extremities.  Symmetrical.  Speech is fluent. Motor exam as listed above. Psychiatric: Judgment intact, Mood & affect appropriate for pt's clinical situation. Dermatologic: No rashes or ulcers noted.  No changes consistent with cellulitis.   CBC Lab Results  Component Value Date   WBC 7.7 09/08/2022   HGB 15.1 (H) 09/08/2022   HCT 43.5 09/08/2022   MCV 87.3 09/08/2022   PLT 246.0 09/08/2022    BMET    Component Value Date/Time   NA 133 (L) 01/27/2023 1134   NA 136 08/02/2021 1629   NA 135 11/02/2014 0609   K 4.8 01/27/2023 1134   K 4.2 11/02/2014 0609   CL 101 01/27/2023 1134   CL 107 11/02/2014 0609   CO2 28 01/27/2023 1134   CO2 23 11/02/2014 0609   GLUCOSE 198 (H) 01/27/2023 1134   GLUCOSE 108 (H) 11/02/2014 0609   BUN 21 04/24/2023 1140   BUN 25 08/02/2021 1629   BUN 20 11/02/2014 0609   CREATININE 0.85 04/24/2023 1140   CREATININE 1.01 11/09/2015 1549   CREATININE 1.01 11/09/2015 1549   CALCIUM 9.4 01/27/2023 1134   CALCIUM 7.3 (L) 11/02/2014 0609   GFRNONAA >60 04/24/2023 1140   GFRNONAA 50 (L) 11/02/2014 0609   GFRAA >60 01/19/2019 0357   GFRAA 58 (L) 11/02/2014 0609   Estimated Creatinine Clearance: 64.7 mL/min (by C-G formula based on SCr of 0.85 mg/dL).  COAG Lab Results  Component Value Date   INR 0.9 10/17/2014   INR 1.1 01/19/2014   INR 0.9  12/06/2013    Radiology VAS [place] LOWER EXTREMITY ARTERIAL DUPLEX  Result Date: 04/23/2023 LOWER EXTREMITY ARTERIAL DUPLEX STUDY Patient Name:  Laura Reed  Date of Exam:   04/22/2023 Medical Rec #: [id number]        Accession #:    [id number] Date of Birth: 1960/08/08        Patient Gender: F Patient Age:   65 years Exam Location:  [facility] Procedure:      VAS [place] LOWER EXTREMITY ARTERIAL DUPLEX Referring Phys: Sheppard Plumber --------------------------------------------------------------------------------  Indications: Peripheral artery disease. Other Factors: H/O right 4th & 5th and left 1st & 3rd toe amputations                12/03/2020 left pop thrombectomy and new stent.  Vascular Interventions: H/O left-to-right fem-fem BPG with lower extremity                         stents & BPGs in [place];                         10/11/09: Left

## 2023-04-24 NOTE — Op Note (Signed)
[facility]  Percutaneous Study/Intervention Procedural Note   Date of Surgery: 04/24/2023,5:02 PM  Surgeon:Ladana Chavero, Latina Craver   Pre-operative Diagnosis:  1.  Ischemia left lower extremity secondary to occlusion of femoral-popliteal bypass graft. 2.  Atherosclerotic occlusive disease bilateral lower extremities with rest pain of the left lower extremity  Post-operative diagnosis:  Same  Procedure(s) Performed:  1.  Abdominal aortogram  2.  Left lower extremity distal runoff third order catheter placement  3.  Percutaneous transluminal angioplasty and stent placement left common iliac artery to 7 mm  4.  Percutaneous transluminal angioplasty and stent placement left external iliac artery to 7 mm  5.  Mechanical thrombectomy of the left common femoral and profunda femoris with the penumbra CAT 6 device  6.  Mechanical thrombectomy of the left femoral-popliteal bypass  7.  StarClose access site right side of the femoral-femoral bypass  Anesthesia: Conscious sedation was administered by the interventional radiology RN under my direct supervision. IV Versed plus fentanyl were utilized. Continuous ECG, pulse oximetry and blood pressure was monitored throughout the entire procedure. Conscious sedation was administered for a total of 175 minutes.  Sheath: 7 French slender sheath right femoral femoral bypass retrograde  Contrast: 45 cc   Fluoroscopy Time: 17.7 minutes  Indications: Patient presents with ischemic rest pain of her left lower extremity.  Noninvasive studies as well as physical examination demonstrate occlusion of the femoral-popliteal bypass graft.  She is undergoing angiography with the hope for intervention for limb salvage.  Procedure:  ATHEL CHIERA a 62 y.o. female who was identified and appropriate procedural time out was performed.  The patient was then placed supine on the table and prepped and draped in the usual sterile fashion.  Ultrasound  was used to evaluate the right common femoral artery is identified and then the scan is carried to identify the femoral-femoral bypass.  It was echolucent and pulsatile indicating it is patent .  An ultrasound image was acquired for the permanent record.  A micropuncture needle was used to access the right femoral-femoral bypass graft under direct ultrasound guidance.  The microwire was then advanced under fluoroscopic guidance without difficulty followed by the micro-sheath.  A 0.035 J wire was advanced without resistance and a 5Fr sheath was placed.    Using a Glidewire and a Kumpe catheter wire and catheter were negotiated into the infrarenal aorta.  Bolus injection of contrast was then performed to demonstrate the infrarenal aorta as well as the bilateral iliac arteries.  5 French sheath was exchanged for a 6 French 11 cm Pinnacle sheath  Based on the images 6000 units of heparin was given (an additional 2000 units was given later in the case).  The catheter was reintroduced and an advantage wire was advanced.  A 8 mm x 40 mm life star stent was then deployed across the common iliac artery stenosis and postdilated with a 7 mm x 40 mm Lutonix balloon inflated to 8 atm for 1 minute.  Follow-up imaging demonstrated less than 10% residual stenosis.  Attention was then turned to the greater than 90% stenosis of the mid to distal external iliac artery.  A 7 French slender sheath was then exchanged for the 6 [place] sheath.  Kumpe catheter was introduced and a V18 wire was then advanced into the aorta.  A 7 mm x 50 mm Viabahn stent was then deployed across the external iliac artery stenosis.  Initially it was postdilated with a 6 mm x 60 mm  Lutonix drug-eluting balloon inflated to 10 atm for approximately 1 minute.  The stenotic area had moderate residual stenosis and I then used a 7 mm x 40 mm [place] balloon inflated to 16 atm for approximately 1 minute.  Follow-up imaging demonstrated less than 10% residual  stenosis and I turned my attention to the distal common femoral and profunda femoris.  Using the Kumpe catheter and the advantage wire I was able to negotiate the wire and catheter into the femoropopliteal bypass and then distally into the distal popliteal.  Hand-injection of contrast through the Kumpe catheter demonstrated to good-sized tibial arteries.  It appears to be the anterior tibial artery and the peroneal.  Both are 3 to 3-1/2 mm in size and patent all the way down to the foot.  Knowing that I had a reasonable distal target I decided to treat the groin area to see if we could recruit the profunda femoris and or the femoropopliteal bypass back into the circulation.  Using a Kumpe catheter and a variety of wires including an 018 as well as an 014 advantage wire and glide wires I was able to negotiate the Kumpe catheter into the profunda femoris.  I then used an infusion catheter with a 20 cm infusion length and instilled approximately 6 mg of tPA into the profunda femoris and distal common femoral.  I then negotiated the wire into the femoropopliteal bypass graft and instilled another 4 mg of tPA.  After 30 minutes of dwell time I proceeded with a penumbra CAT 6.  Thrombectomy of the distal common femoral and profunda femoris was performed and multiple passes were made.  This was successful with reestablishment of flow through the entire common femoral and into a patent profunda femoris with excellent collaterals extending distally.  Mechanical thrombectomy of the femoropopliteal bypass graft was partially successful.  Given the length of time that we have been working the moderate blood loss of approximately 250 cc and knowing that there was an excellent below the knee target for possible bypass I elected to initiate Aggrastat at this point with the plans of reangiography in the next 72 hours and then depending on the findings at that study possible redo femoral to below-knee bypass.  Findings:    Aortogram: The aorta demonstrates diffuse calcifications but there are no hemodynamically significant stenosis.  There is a small infrarenal aneurysm identified.  The iliac arteries both common and external have been stented extensively.  The right is chronically occluded.  The left demonstrates a 65 to 70% stenosis in the midportion of the common iliac just proximal to the leading edge of the previously placed stent.  There is a greater than 90% stenosis in the mid to distal external iliac artery.  The femoral-femoral bypass is widely patent and both anastomoses appear widely patent.  Right Lower Extremity: The right common femoral is patent more distal imaging is not obtained.  Left Lower Extremity: The left common femoral is patent below the distal margin of the existing external iliac stent to the level of the femorofemoral anastomosis.  Below the level of the anastomosis the common femoral is occluded.  The origin of the profunda femoris is occluded there is reconstitution of the profunda femoris via pelvic collaterals.  The SFA is chronically occluded.  The femoropopliteal bypass is occluded.  As noted above later in the case I was able to get through the femoropopliteal bypass and did demonstrate there are 2 good caliber tibial arteries which I believe are the anterior  tibial and the peroneal which are patent down to the foot.  Following angioplasty and stent placement of the left common iliac there is less than 10% residual stenosis.  Following angioplasty and stent placement of the left external iliac artery there is 10% residual stenosis.  Following mechanical thrombectomy of the distal common femoral and profunda femoris there is now restoration of flow through the common femoral and the profunda femoris.  Following mechanical thrombectomy of the femoropopliteal bypass there is partial flow.    Disposition: Patient was taken to the recovery room in stable condition having tolerated the  procedure well.  Earl Lites Jia Dottavio 04/24/2023,5:02 PM

## 2023-04-24 NOTE — Anesthesia Preprocedure Evaluation (Deleted)
Anesthesia Evaluation    Airway        Dental   Pulmonary former smoker (quit 2014)          Cardiovascular hypertension, + CAD (s/p CABG), + Peripheral Vascular Disease (on Plavix) and +CHF    Myocardial perfusion 12/19/21:  Normal Lexiscan infusion EKG  Abnormal myocardial perfusion study with moderate reversibility of the  anterior apical myocardium consistent with myocardial ischemia   Echo 12/19/21:  NORMAL LEFT VENTRICULAR SYSTOLIC FUNCTION  NORMAL RIGHT VENTRICULAR SYSTOLIC FUNCTION  MILD VALVULAR REGURGITATION  NO VALVULAR STENOSIS    Neuro/Psych  PSYCHIATRIC DISORDERS Anxiety      Neuromuscular disease (neuropathy)    GI/Hepatic   Endo/Other  diabetes, Type 2, Insulin Dependent    Renal/GU Renal disease (urothelial CA s/p nephrectomy)   Bladder CA    Musculoskeletal  (+) Arthritis ,    Abdominal   Peds  Hematology   Anesthesia Other Findings Cardiology note 07/08/22:  63 y.o. female with  1. Atherosclerosis of native artery of right lower extremity with intermittent claudication (CMS-HCC)  2. Bradycardia, drug induced  3. Hypertension, essential  4. Atherosclerosis of artery of extremity with ulceration (CMS-HCC)  5. Coronary artery disease of bypass graft of native heart with stable angina pectoris (CMS-HCC)  6. Hyperlipidemia, mixed   62 year old female with known coronary artery disease, status post CABG x3 in [place], currently without chest pain. 2D echocardiogram 12/19/2021 revealed normal left ventricular function, Lexiscan Myoview revealed moderate anteroapical ischemia in the absence of chest pain. Patient has essential hypertension, blood pressure well controlled on current BP medications. The patient has hyperlipidemia, on atorvastatin.  Plan   1. Continue current medications 2. Counseled patient about low-sodium diet 3. DASH diet printed instructions given to the patient 4.  Counseled patient about low-cholesterol diet 5. Continue atorvastatin for hyperlipidemia management  6. Low-fat and cholesterol diet printed instructions given to the patient 7. Diabetes diet printed instructions given to the patient 8. Return to clinic for follow-up in 4 months  No orders of the defined types were placed in this encounter.  Return in about 4 months (around 11/06/2022).    Reproductive/Obstetrics                             Anesthesia Physical Anesthesia Plan  ASA: 3 and emergent  Anesthesia Plan: General   Post-op Pain Management:    Induction:   PONV Risk Score and Plan:   Airway Management Planned:   Additional Equipment:   Intra-op Plan:   Post-operative Plan:   Informed Consent:   Plan Discussed with:   Anesthesia Plan Comments: (Case canceled on 04/24/23; surgery no longer indicated.)        Anesthesia Quick Evaluation

## 2023-04-25 LAB — CBC
HCT: 37 % (ref 36.0–46.0)
Hemoglobin: 13.1 g/dL (ref 12.0–15.0)
MCH: 30.5 pg (ref 26.0–34.0)
MCHC: 35.4 g/dL (ref 30.0–36.0)
MCV: 86.2 fL (ref 80.0–100.0)
Platelets: 188 10*3/uL (ref 150–400)
RBC: 4.29 MIL/uL (ref 3.87–5.11)
RDW: 14 % (ref 11.5–15.5)
WBC: 9.5 10*3/uL (ref 4.0–10.5)
nRBC: 0 % (ref 0.0–0.2)

## 2023-04-25 LAB — BASIC METABOLIC PANEL
Anion gap: 10 (ref 5–15)
BUN: 23 mg/dL (ref 8–23)
CO2: 21 mmol/L — ABNORMAL LOW (ref 22–32)
Calcium: 7.8 mg/dL — ABNORMAL LOW (ref 8.9–10.3)
Chloride: 104 mmol/L (ref 98–111)
Creatinine, Ser: 0.78 mg/dL (ref 0.44–1.00)
GFR, Estimated: >60 mL/min (ref >60)
Glucose, Bld: 216 mg/dL — ABNORMAL HIGH (ref 70–99)
Potassium: 3.9 mmol/L (ref 3.5–5.1)
Sodium: 135 mmol/L (ref 135–145)

## 2023-04-25 LAB — GLUCOSE, CAPILLARY
Glucose-Capillary: 149 mg/dL — ABNORMAL HIGH (ref 70–99)
Glucose-Capillary: 157 mg/dL — ABNORMAL HIGH (ref 70–99)
Glucose-Capillary: 164 mg/dL — ABNORMAL HIGH (ref 70–99)
Glucose-Capillary: 196 mg/dL — ABNORMAL HIGH (ref 70–99)
Glucose-Capillary: 202 mg/dL — ABNORMAL HIGH (ref 70–99)

## 2023-04-25 LAB — HEPARIN LEVEL (UNFRACTIONATED)
Heparin Unfractionated: 0.27 [IU]/mL — ABNORMAL LOW (ref 0.30–0.70)
Heparin Unfractionated: 0.62 [IU]/mL (ref 0.30–0.70)

## 2023-04-25 MED ORDER — HEPARIN (PORCINE) 25000 UT/250ML-% IV SOLN
1150.0000 [IU]/h | INTRAVENOUS | Status: DC
  Administered 2023-04-26 – 2023-04-28 (×4): 1150 [IU]/h via INTRAVENOUS
  Filled 2023-04-25 (×4): qty 250

## 2023-04-25 MED ORDER — HEPARIN BOLUS VIA INFUSION
950.0000 [IU] | Freq: Once | INTRAVENOUS | Status: AC
  Administered 2023-04-25: 950 [IU] via INTRAVENOUS

## 2023-04-25 NOTE — Progress Notes (Signed)
PHARMACY - ANTICOAGULATION CONSULT NOTE  Pharmacy Consult for Heparin Infusion and Aggrastat Infusion Indication:  Post lower extremity thrombectomy  No Known Allergies  Patient Measurements: Height: 5\' 7"  (170.2 cm) Weight: 61.2 kg (134 lb 14.7 oz) IBW/kg (Calculated) : 61.6 Heparin Dosing Weight: 61.2 kg  Vital Signs: Temp: 98.7 F (37.1 C) (10/19 1200) Temp Source: Oral (10/19 1200) BP: 133/123 (10/19 1400) Pulse Rate: 76 (10/19 1400)  Labs: Recent Labs    04/24/23 1140 04/24/23 2338 04/25/23 1301  HGB  --  13.1  --   HCT  --  37.0  --   PLT  --  188  --   HEPARINUNFRC  --   --  0.27*  CREATININE 0.85 0.78  --     Estimated Creatinine Clearance: 68.6 mL/min (by C-G formula based on SCr of 0.78 mg/dL).   Medical History: Past Medical History:  Diagnosis Date   Absence of kidney    left   Anxiety    Arthritis    Bladder cancer (HCC)    CHF (congestive heart failure) (HCC)    Complication of anesthesia    BP HAS  RUN LOW AFTER SURGERY-LUNGS FILLED UP WITH FLUID AFTER  LEG STENT SURGERY    Coronary artery disease    Diabetes mellitus    Family history of adverse reaction to anesthesia    Sister - PONV   GERD (gastroesophageal reflux disease)    OCC TUMS   Heart murmur    Hemorrhoid    History of methicillin resistant staphylococcus aureus (MRSA) 2007   Hypertension    Neuropathy    PVD (peripheral vascular disease) (HCC)    Thyroid nodule    right   Urothelial carcinoma of kidney (HCC) 10/31/2014   INVASIVE UROTHELIAL CARCINOMA, LOW GRADE. T1, Nx.   Wears dentures    full upper and lower    Assessment: Patient is a 62yo female with left lower extremity ischemia, she is s/p mechanical thrombectomy. Pharmacy consulted for Aggrastat dosing to run until 10/19 07:00 followed by Heparin Infusion.   Goal of Therapy:  Heparin level 0.3-0.7 units/ml Monitor platelets by anticoagulation protocol: Yes  Levels 10/19 @ 1301 0.27; subtherapeutic    Plan:   - Bolus 950 units - Increase infusion to 1150 units/hr - Check HL 6 hours after starting Heparin infusion - Daily CBC while on Heparin infusion  Effie Shy, PharmD Pharmacy Resident  04/25/2023 2:48 PM

## 2023-04-25 NOTE — Plan of Care (Signed)
  Problem: Education: Goal: Knowledge of General Education information will improve Description: Including pain rating scale, medication(s)/side effects and non-pharmacologic comfort measures Outcome: Progressing   Problem: Health Behavior/Discharge Planning: Goal: Ability to manage health-related needs will improve Outcome: Progressing   Problem: Clinical Measurements: Goal: Ability to maintain clinical measurements within normal limits will improve Outcome: Progressing Goal: Will remain free from infection Outcome: Progressing Goal: Diagnostic test results will improve Outcome: Progressing Goal: Respiratory complications will improve Outcome: Progressing Goal: Cardiovascular complication will be avoided Outcome: Progressing   Problem: Activity: Goal: Risk for activity intolerance will decrease Outcome: Progressing   Problem: Nutrition: Goal: Adequate nutrition will be maintained Outcome: Progressing   Problem: Coping: Goal: Level of anxiety will decrease Outcome: Progressing   Problem: Elimination: Goal: Will not experience complications related to bowel motility Outcome: Progressing Goal: Will not experience complications related to urinary retention Outcome: Progressing   Problem: Pain Managment: Goal: General experience of comfort will improve Outcome: Progressing   Problem: Safety: Goal: Ability to remain free from injury will improve Outcome: Progressing   Problem: Skin Integrity: Goal: Risk for impaired skin integrity will decrease Outcome: Progressing   Problem: Education: Goal: Ability to describe self-care measures that may prevent or decrease complications (Diabetes Survival Skills Education) will improve Outcome: Progressing Goal: Individualized Educational Video(s) Outcome: Progressing   Problem: Coping: Goal: Ability to adjust to condition or change in health will improve Outcome: Progressing   Problem: Fluid Volume: Goal: Ability to  maintain a balanced intake and output will improve Outcome: Progressing   Problem: Health Behavior/Discharge Planning: Goal: Ability to identify and utilize available resources and services will improve Outcome: Progressing Goal: Ability to manage health-related needs will improve Outcome: Progressing   Problem: Metabolic: Goal: Ability to maintain appropriate glucose levels will improve Outcome: Progressing   Problem: Nutritional: Goal: Maintenance of adequate nutrition will improve Outcome: Progressing Goal: Progress toward achieving an optimal weight will improve Outcome: Progressing   Problem: Skin Integrity: Goal: Risk for impaired skin integrity will decrease Outcome: Progressing   Problem: Tissue Perfusion: Goal: Adequacy of tissue perfusion will improve Outcome: Progressing   Problem: Education: Goal: Knowledge of prescribed regimen will improve Outcome: Progressing   Problem: Activity: Goal: Ability to tolerate increased activity will improve Outcome: Progressing   Problem: Bowel/Gastric: Goal: Gastrointestinal status for postoperative course will improve Outcome: Progressing   Problem: Clinical Measurements: Goal: Postoperative complications will be avoided or minimized Outcome: Progressing Goal: Signs and symptoms of graft occlusion will improve Outcome: Progressing   Problem: Skin Integrity: Goal: Demonstration of wound healing without infection will improve Outcome: Progressing

## 2023-04-25 NOTE — Progress Notes (Addendum)
PHARMACY - ANTICOAGULATION CONSULT NOTE  Pharmacy Consult for Heparin Infusion and Aggrastat Infusion Indication:  Post lower extremity thrombectomy  No Known Allergies  Patient Measurements: Height: 5\' 7"  (170.2 cm) Weight: 61.2 kg (134 lb 14.7 oz) IBW/kg (Calculated) : 61.6 Heparin Dosing Weight: 61.2 kg  Vital Signs: Temp: 98 F (36.7 C) (10/19 2000) Temp Source: Oral (10/19 1542) BP: 104/51 (10/19 2000) Pulse Rate: 38 (10/19 2000)  Labs: Recent Labs    04/24/23 1140 04/24/23 2338 04/25/23 1301 04/25/23 2145  HGB  --  13.1  --   --   HCT  --  37.0  --   --   PLT  --  188  --   --   HEPARINUNFRC  --   --  0.27* 0.62  CREATININE 0.85 0.78  --   --     Estimated Creatinine Clearance: 68.6 mL/min (by C-G formula based on SCr of 0.78 mg/dL).   Medical History: Past Medical History:  Diagnosis Date   Absence of kidney    left   Anxiety    Arthritis    Bladder cancer (HCC)    CHF (congestive heart failure) (HCC)    Complication of anesthesia    BP HAS  RUN LOW AFTER SURGERY-LUNGS FILLED UP WITH FLUID AFTER  LEG STENT SURGERY    Coronary artery disease    Diabetes mellitus    Family history of adverse reaction to anesthesia    Sister - PONV   GERD (gastroesophageal reflux disease)    OCC TUMS   Heart murmur    Hemorrhoid    History of methicillin resistant staphylococcus aureus (MRSA) 2007   Hypertension    Neuropathy    PVD (peripheral vascular disease) (HCC)    Thyroid nodule    right   Urothelial carcinoma of kidney (HCC) 10/31/2014   INVASIVE UROTHELIAL CARCINOMA, LOW GRADE. T1, Nx.   Wears dentures    full upper and lower    Assessment: Patient is a 62yo female with left lower extremity ischemia, she is s/p mechanical thrombectomy. Pharmacy consulted for Aggrastat dosing to run until 10/19 07:00 followed by Heparin Infusion.   Goal of Therapy:  Heparin level 0.3-0.7 units/ml Monitor platelets by anticoagulation protocol: Yes  Levels 10/19 @  1301 0.27; subtherapeutic  10/19 @ 2145 0.62, therapeutic x 1   Plan:  - Continue heparin infusion at 1150 units/hr - Check confirmatory HL with morning labs - Daily CBC while on Heparin infusion  Bettey Costa, PharmD Clinical Pharmacist 04/25/2023 10:25 PM

## 2023-04-25 NOTE — Progress Notes (Signed)
1 Day Post-Op   Subjective/Chief Complaint: Doing OK. States she wants to get out of bed secondary to back discomfort. Denies leg pain. States her leg feels much better. On Aggrastst   Objective: Vital signs in last 24 hours: Temp:  [97.5 F (36.4 C)-98.6 F (37 C)] 98.6 F (37 C) (10/19 0000) Pulse Rate:  [0-99] 59 (10/19 0300) Resp:  [9-25] 18 (10/19 0300) BP: (70-135)/(32-92) 70/53 (10/19 0300) SpO2:  [94 %-100 %] 95 % (10/19 0300) Weight:  [61.2 kg-61.3 kg] 61.2 kg (10/18 1718) Last BM Date : 04/23/23  Intake/Output from previous day: 10/18 0701 - 10/19 0700 In: 2316.5 [I.V.:1331.2; IV Piggyback:985.3] Out: 800 [Urine:800] Intake/Output this shift: Total I/O In: 226.7 [I.V.:88; IV Piggyback:138.6] Out: 500 [Urine:500]  General appearance: alert and no distress Resp: clear to auscultation bilaterally Cardio: regular rate and rhythm Extremities: Right groin- soft, no hematoma, LEFT lower extremity warm to toes, +DP doppler  Lab Results:  Recent Labs    04/24/23 2338  WBC 9.5  HGB 13.1  HCT 37.0  PLT 188   BMET Recent Labs    04/24/23 1140 04/24/23 2338  NA  --  135  K  --  3.9  CL  --  104  CO2  --  21*  GLUCOSE  --  216*  BUN 21 23  CREATININE 0.85 0.78  CALCIUM  --  7.8*   PT/INR No results for input(s): "LABPROT", "INR" in the last 72 hours. ABG No results for input(s): "PHART", "HCO3" in the last 72 hours.  Invalid input(s): "PCO2", "PO2"  Studies/Results: PERIPHERAL VASCULAR CATHETERIZATION  Result Date: 04/24/2023 See surgical note for result.   Anti-infectives: Anti-infectives (From admission, onward)    Start     Dose/Rate Route Frequency Ordered Stop   04/24/23 2100  ceFAZolin (ANCEF) IVPB 2g/100 mL premix        2 g 200 mL/hr over 30 Minutes Intravenous Every 8 hours 04/24/23 1701 04/25/23 1259   04/24/23 1107  ceFAZolin (ANCEF) IVPB 2g/100 mL premix        2 g 200 mL/hr over 30 Minutes Intravenous 30 min pre-op 04/24/23 1107  04/24/23 1729       Assessment/Plan: s/p Procedure(s): Lower Extremity Angiography (Left) Angioplasty and stent placement left common iliac artery ; left external iliac arteryo 7 mm Mechanical thrombectomy of the left common femoral and profunda femoris with the penumbra CAT 6 device Mechanical thrombectomy of the left femoral-popliteal bypass  Monitor Vascular exam Heparin gtt once Aggrastat complete this morning OK for OOB to chair, however, keep in ICU     LOS: 1 day    Eli Hose A 04/25/2023

## 2023-04-25 NOTE — Plan of Care (Signed)
  Problem: Education: Goal: Knowledge of prescribed regimen will improve Outcome: Progressing   Problem: Activity: Goal: Ability to tolerate increased activity will improve Outcome: Progressing   Problem: Bowel/Gastric: Goal: Gastrointestinal status for postoperative course will improve Outcome: Progressing   Problem: Clinical Measurements: Goal: Postoperative complications will be avoided or minimized Outcome: Progressing Goal: Signs and symptoms of graft occlusion will improve Outcome: Progressing   Problem: Skin Integrity: Goal: Demonstration of wound healing without infection will improve Outcome: Progressing   

## 2023-04-25 NOTE — Progress Notes (Signed)
Patent A/Ox4, SR, room air with stable pressures. Patient ambulates with assist to [facility]. PRN medications given per orders.

## 2023-04-26 ENCOUNTER — Encounter: Payer: Self-pay | Admitting: Internal Medicine

## 2023-04-26 LAB — HEPARIN LEVEL (UNFRACTIONATED): Heparin Unfractionated: 0.68 [IU]/mL (ref 0.30–0.70)

## 2023-04-26 LAB — CBC
HCT: 38 % (ref 36.0–46.0)
Hemoglobin: 13.1 g/dL (ref 12.0–15.0)
MCH: 30.1 pg (ref 26.0–34.0)
MCHC: 34.5 g/dL (ref 30.0–36.0)
MCV: 87.4 fL (ref 80.0–100.0)
Platelets: 192 10*3/uL (ref 150–400)
RBC: 4.35 MIL/uL (ref 3.87–5.11)
RDW: 14.1 % (ref 11.5–15.5)
WBC: 10.4 10*3/uL (ref 4.0–10.5)
nRBC: 0 % (ref 0.0–0.2)

## 2023-04-26 LAB — GLUCOSE, CAPILLARY
Glucose-Capillary: 176 mg/dL — ABNORMAL HIGH (ref 70–99)
Glucose-Capillary: 197 mg/dL — ABNORMAL HIGH (ref 70–99)
Glucose-Capillary: 194 mg/dL — ABNORMAL HIGH (ref 70–99)
Glucose-Capillary: 193 mg/dL — ABNORMAL HIGH (ref 70–99)

## 2023-04-26 LAB — HEMOGLOBIN AND HEMATOCRIT, BLOOD
HCT: 40.5 % (ref 36.0–46.0)
Hemoglobin: 13.9 g/dL (ref 12.0–15.0)

## 2023-04-26 NOTE — Progress Notes (Signed)
2 Days Post-Op   Subjective/Chief Complaint: States her LEFT foot is intermittently painful but unchanged from prior exam. She has been OOB to chair and bedside commode. Otherwise without complaint. Remains on Heparin gtt.   Objective: Vital signs in last 24 hours: Temp:  [98 F (36.7 C)-99 F (37.2 C)] 98.2 F (36.8 C) (10/20 1200) Pulse Rate:  [38-76] 72 (10/20 1200) Resp:  [10-27] 15 (10/20 1300) BP: (88-155)/(47-123) 117/69 (10/20 1300) SpO2:  [93 %-100 %] 100 % (10/20 1200) Last BM Date : 04/25/23  Intake/Output from previous day: 10/19 0701 - 10/20 0700 In: 991.9 [P.O.:660; I.V.:229.2; IV Piggyback:102.8] Out: -  Intake/Output this shift: Total I/O In: 265.1 [P.O.:240; I.V.:25.1] Out: -   General appearance: alert and no distress Cardio: regular rate and rhythm Extremities: Right groin- soft, LEFT lower extremity- warm to toes, very faint DP  Lab Results:  Recent Labs    04/24/23 2338 04/26/23 0649  WBC 9.5 10.4  HGB 13.1 13.1  HCT 37.0 38.0  PLT 188 192   BMET Recent Labs    04/24/23 1140 04/24/23 2338  NA  --  135  K  --  3.9  CL  --  104  CO2  --  21*  GLUCOSE  --  216*  BUN 21 23  CREATININE 0.85 0.78  CALCIUM  --  7.8*   PT/INR No results for input(s): "LABPROT", "INR" in the last 72 hours. ABG No results for input(s): "PHART", "HCO3" in the last 72 hours.  Invalid input(s): "PCO2", "PO2"  Studies/Results: No results found.  Anti-infectives: Anti-infectives (From admission, onward)    Start     Dose/Rate Route Frequency Ordered Stop   04/24/23 2100  ceFAZolin (ANCEF) IVPB 2g/100 mL premix        2 g 200 mL/hr over 30 Minutes Intravenous Every 8 hours 04/24/23 1701 04/25/23 0701   04/24/23 1107  ceFAZolin (ANCEF) IVPB 2g/100 mL premix        2 g 200 mL/hr over 30 Minutes Intravenous 30 min pre-op 04/24/23 1107 04/24/23 1729       Assessment/Plan: s/p Procedure(s): Lower Extremity Angiography (Left) Continue Heparin gtt-  informed by nursing of an episode of hematuria- will monitor- at this juncture anticoagulation is critical for limb salvage  HgB remains stable, not supra therapeutic on Heparin gtt Still remains a possibility that she will need further intervention/bypass this admission Will monitor closely OK to transfer to Tele  LOS: 2 days    Eli Hose A 04/26/2023

## 2023-04-26 NOTE — Progress Notes (Signed)
Pt voided in [facility] and urine appears to have a moderate amount of blood in it. Pt remains on Heparin gtt. Dr. Evie Lacks notified and updated on new findings. No new orders at this time. Will continue to monitor pt.

## 2023-04-26 NOTE — Progress Notes (Signed)
PHARMACY - ANTICOAGULATION CONSULT NOTE  Pharmacy Consult for Heparin Infusion Indication:  Post lower extremity thrombectomy  No Known Allergies  Patient Measurements: Height: 5\' 7"  (170.2 cm) Weight: 61.2 kg (134 lb 14.7 oz) IBW/kg (Calculated) : 61.6 Heparin Dosing Weight: 61.2 kg  Vital Signs: Temp: 98.6 F (37 C) (10/20 0325) Temp Source: Oral (10/20 0325) BP: 119/47 (10/20 0700) Pulse Rate: 69 (10/20 0700)  Labs: Recent Labs    04/24/23 1140 04/24/23 2338 04/25/23 1301 04/25/23 2145 04/26/23 0649  HGB  --  13.1  --   --  13.1  HCT  --  37.0  --   --  38.0  PLT  --  188  --   --  192  HEPARINUNFRC  --   --  0.27* 0.62 0.68  CREATININE 0.85 0.78  --   --   --     Estimated Creatinine Clearance: 68.6 mL/min (by C-G formula based on SCr of 0.78 mg/dL).   Medical History: Past Medical History:  Diagnosis Date   Absence of kidney    left   Anxiety    Arthritis    Bladder cancer (HCC)    CHF (congestive heart failure) (HCC)    Complication of anesthesia    BP HAS  RUN LOW AFTER SURGERY-LUNGS FILLED UP WITH FLUID AFTER  LEG STENT SURGERY    Coronary artery disease    Diabetes mellitus    Family history of adverse reaction to anesthesia    Sister - PONV   GERD (gastroesophageal reflux disease)    OCC TUMS   Heart murmur    Hemorrhoid    History of methicillin resistant staphylococcus aureus (MRSA) 2007   Hypertension    Neuropathy    PVD (peripheral vascular disease) (HCC)    Thyroid nodule    right   Urothelial carcinoma of kidney (HCC) 10/31/2014   INVASIVE UROTHELIAL CARCINOMA, LOW GRADE. T1, Nx.   Wears dentures    full upper and lower    Assessment: Patient is a 62yo female with left lower extremity ischemia, she is s/p mechanical thrombectomy. Pharmacy consulted for Aggrastat dosing to run until 10/19 07:00 followed by Heparin Infusion.   Goal of Therapy:  Heparin level 0.3-0.7 units/ml Monitor platelets by anticoagulation protocol:  Yes  Levels 10/19 @ 1301 0.27; subtherapeutic  10/19 @ 2145 0.62, therapeutic x 1 10/20 @ 0649 0.68, therapeutic x 1   Plan:  Heparin level is therapeutic. Will continue heparin infusion at 1150 units/hr. Recheck heparin level and CBC with AM labs.   Ronnald Ramp, PharmD Clinical Pharmacist 04/26/2023 8:01 AM

## 2023-04-26 NOTE — Plan of Care (Signed)
Pt progressing towards goals.

## 2023-04-26 NOTE — Progress Notes (Signed)
Received pt from ICU as a transfer got report prior and bedside. Patient is on bedrest and verbalized understanding, patient is on Heparin drip that was also verified bedside.

## 2023-04-27 ENCOUNTER — Telehealth: Payer: Self-pay

## 2023-04-27 ENCOUNTER — Encounter: Payer: Self-pay | Admitting: Vascular Surgery

## 2023-04-27 DIAGNOSIS — Z87891 Personal history of nicotine dependence: Secondary | ICD-10-CM

## 2023-04-27 DIAGNOSIS — I70201 Unspecified atherosclerosis of native arteries of extremities, right leg: Secondary | ICD-10-CM

## 2023-04-27 DIAGNOSIS — L97528 Non-pressure chronic ulcer of other part of left foot with other specified severity: Secondary | ICD-10-CM | POA: Diagnosis not present

## 2023-04-27 DIAGNOSIS — Z9889 Other specified postprocedural states: Secondary | ICD-10-CM | POA: Diagnosis not present

## 2023-04-27 DIAGNOSIS — I70262 Atherosclerosis of native arteries of extremities with gangrene, left leg: Secondary | ICD-10-CM | POA: Diagnosis not present

## 2023-04-27 LAB — GLUCOSE, CAPILLARY
Glucose-Capillary: 190 mg/dL — ABNORMAL HIGH (ref 70–99)
Glucose-Capillary: 179 mg/dL — ABNORMAL HIGH (ref 70–99)
Glucose-Capillary: 162 mg/dL — ABNORMAL HIGH (ref 70–99)
Glucose-Capillary: 224 mg/dL — ABNORMAL HIGH (ref 70–99)
Glucose-Capillary: 216 mg/dL — ABNORMAL HIGH (ref 70–99)

## 2023-04-27 LAB — CBC
HCT: 37.9 % (ref 36.0–46.0)
Hemoglobin: 13.7 g/dL (ref 12.0–15.0)
MCH: 30.6 pg (ref 26.0–34.0)
MCHC: 36.1 g/dL — ABNORMAL HIGH (ref 30.0–36.0)
MCV: 84.6 fL (ref 80.0–100.0)
Platelets: 201 10*3/uL (ref 150–400)
RBC: 4.48 MIL/uL (ref 3.87–5.11)
RDW: 13.9 % (ref 11.5–15.5)
WBC: 10.6 10*3/uL — ABNORMAL HIGH (ref 4.0–10.5)
nRBC: 0 % (ref 0.0–0.2)

## 2023-04-27 LAB — HEPARIN LEVEL (UNFRACTIONATED): Heparin Unfractionated: 0.59 [IU]/mL (ref 0.30–0.70)

## 2023-04-27 MED ORDER — FAMOTIDINE 20 MG PO TABS
20.0000 mg | ORAL_TABLET | Freq: Two times a day (BID) | ORAL | Status: DC
  Administered 2023-04-27 – 2023-04-29 (×4): 20 mg via ORAL
  Filled 2023-04-27 (×4): qty 1

## 2023-04-27 NOTE — Progress Notes (Signed)
PHARMACY - ANTICOAGULATION CONSULT NOTE  Pharmacy Consult for Heparin Infusion Indication:  Post lower extremity thrombectomy  No Known Allergies  Patient Measurements: Height: 5\' 7"  (170.2 cm) Weight: 61.2 kg (134 lb 14.7 oz) IBW/kg (Calculated) : 61.6 Heparin Dosing Weight: 61.2 kg  Vital Signs: Temp: 97.9 F (36.6 C) (10/21 0412) Temp Source: Oral (10/21 0412) BP: 126/79 (10/21 0412) Pulse Rate: 69 (10/21 0412)  Labs: Recent Labs     0000 04/24/23 1140 04/24/23 2338 04/25/23 1301 04/25/23 2145 04/26/23 0649 04/26/23 1811 04/27/23 0456  HGB   < >  --  13.1  --   --  13.1 13.9 13.7  HCT   < >  --  37.0  --   --  38.0 40.5 37.9  PLT  --   --  188  --   --  192  --  201  HEPARINUNFRC  --   --   --    < > 0.62 0.68  --  0.59  CREATININE  --  0.85 0.78  --   --   --   --   --    < > = values in this interval not displayed.    Estimated Creatinine Clearance: 68.6 mL/min (by C-G formula based on SCr of 0.78 mg/dL).   Medical History: Past Medical History:  Diagnosis Date   Absence of kidney    left   Anxiety    Arthritis    Bladder cancer (HCC)    CHF (congestive heart failure) (HCC)    Complication of anesthesia    BP HAS  RUN LOW AFTER SURGERY-LUNGS FILLED UP WITH FLUID AFTER  LEG STENT SURGERY    Coronary artery disease    Diabetes mellitus    Family history of adverse reaction to anesthesia    Sister - PONV   GERD (gastroesophageal reflux disease)    OCC TUMS   Heart murmur    Hemorrhoid    History of methicillin resistant staphylococcus aureus (MRSA) 2007   Hypertension    Neuropathy    PVD (peripheral vascular disease) (HCC)    Thyroid nodule    right   Urothelial carcinoma of kidney (HCC) 10/31/2014   INVASIVE UROTHELIAL CARCINOMA, LOW GRADE. T1, Nx.   Wears dentures    full upper and lower    Assessment: Patient is a 62yo female with left lower extremity ischemia, she is s/p mechanical thrombectomy. Pharmacy consulted for Aggrastat dosing  to run until 10/19 07:00 followed by Heparin Infusion.   Goal of Therapy:  Heparin level 0.3-0.7 units/ml Monitor platelets by anticoagulation protocol: Yes  Levels 10/19 @ 1301 0.27; subtherapeutic  10/19 @ 2145 0.62, therapeutic x 1 10/20 @ 0649 0.68, therapeutic x 2 10/21 0456 0.59, therapeutic x 3   Plan:  Heparin level is therapeutic. Will continue heparin infusion at 1150 units/hr. Recheck heparin level and CBC with AM labs.   Otelia Sergeant, PharmD, [facility] 04/27/2023 6:08 AM

## 2023-04-27 NOTE — Progress Notes (Signed)
PHARMACIST - PHYSICIAN COMMUNICATION  CONCERNING: IV to Oral Route Change Policy  RECOMMENDATION: This patient is receiving Pepcid by the intravenous route.  Based on criteria approved by the Pharmacy and Therapeutics Committee, the intravenous medication(s) is/are being converted to the equivalent oral dose form(s).  DESCRIPTION: These criteria include: The patient is eating (either orally or via tube) and/or has been taking other orally administered medications for a least 24 hours The patient has no evidence of active gastrointestinal bleeding or impaired GI absorption (gastrectomy, short bowel, patient on TNA or NPO).  If you have questions about this conversion, please contact the Pharmacy Department   Tressie Ellis, [facility] 04/27/2023 2:10 PM

## 2023-04-27 NOTE — Progress Notes (Signed)
Oral   Oral  SpO2: 94% 98%  100%  Weight:      Height:       Body mass index is 21.13 kg/m. Gen: WD/WN, NAD Head: Bruce/AT, No temporalis wasting.  Ear/Nose/Throat: Hearing grossly intact, nares w/o erythema or drainage Eyes: PER, EOMI, sclera nonicteric.  Neck: Supple, no masses.  No bruit or  JVD.  Pulmonary:  Good air movement, no audible wheezing, no use of accessory muscles.  Cardiac: RRR, normal S1, S2, no Murmurs. Vascular: Moderate trophic changes,  open wounds with the left fifth toe appearing ischemic Vessel Right Left  Radial Palpable Palpable  PT Not Palpable Not Palpable  DP Not Palpable Not Palpable  Gastrointestinal: soft, non-distended. No guarding/no peritoneal signs.  Musculoskeletal: M/S 5/5 throughout.  No visible deformity.  Neurologic: CN 2-12 intact. Pain and light touch intact in extremities.  Symmetrical.  Speech is fluent. Motor exam as listed above. Psychiatric: Judgment intact, Mood & affect appropriate for pt's clinical situation. Dermatologic: No rashes or ulcers noted.  No changes consistent with cellulitis.   CBC Lab Results  Component Value Date   WBC 10.6 (H) 04/27/2023   HGB 13.7 04/27/2023   HCT 37.9 04/27/2023   MCV 84.6 04/27/2023   PLT 201 04/27/2023    BMET    Component Value Date/Time   NA 135 04/24/2023 2338   NA 136 08/02/2021 1629   NA 135 11/02/2014 0609   K 3.9 04/24/2023 2338   K 4.2 11/02/2014 0609   CL 104 04/24/2023 2338   CL 107 11/02/2014 0609   CO2 21 (L) 04/24/2023 2338   CO2 23 11/02/2014 0609   GLUCOSE 216 (H) 04/24/2023 2338   GLUCOSE 108 (H) 11/02/2014 0609   BUN 23 04/24/2023 2338   BUN 25 08/02/2021 1629   BUN 20 11/02/2014 0609   CREATININE 0.78 04/24/2023 2338   CREATININE 1.01 11/09/2015 1549   CREATININE 1.01 11/09/2015 1549   CALCIUM 7.8 (L) 04/24/2023 2338   CALCIUM 7.3 (L) 11/02/2014 0609   GFRNONAA >60 04/24/2023 2338   GFRNONAA 50 (L) 11/02/2014 0609   GFRAA >60 01/19/2019 0357   GFRAA 58 (L) 11/02/2014 0609   Estimated Creatinine Clearance: 68.6 mL/min (by C-G formula based on SCr of 0.78 mg/dL).  COAG Lab Results  Component Value Date   INR 0.9 10/17/2014   INR 1.1 01/19/2014   INR 0.9 12/06/2013    Radiology PERIPHERAL VASCULAR CATHETERIZATION  Result Date:  04/24/2023 See surgical note for result.  VAS [place] LOWER EXTREMITY ARTERIAL DUPLEX  Result Date: 04/23/2023 LOWER EXTREMITY ARTERIAL DUPLEX STUDY Patient Name:  Laura Reed  Date of Exam:   04/22/2023 Medical Rec #: [id number]        Accession #:    [id number] Date of Birth: 12-11-1960        Patient Gender: F Patient Age:   62 years Exam Location:  [facility] Procedure:      VAS [place] LOWER EXTREMITY ARTERIAL DUPLEX Referring Phys: Sheppard Plumber --------------------------------------------------------------------------------  Indications: Peripheral artery disease. Other Factors: H/O right 4th & 5th and left 1st & 3rd toe amputations                12/03/2020 left pop thrombectomy and new stent.  Vascular Interventions: H/O left-to-right fem-fem BPG with lower extremity                         stents & BPGs in [place];  Oral   Oral  SpO2: 94% 98%  100%  Weight:      Height:       Body mass index is 21.13 kg/m. Gen: WD/WN, NAD Head: Bruce/AT, No temporalis wasting.  Ear/Nose/Throat: Hearing grossly intact, nares w/o erythema or drainage Eyes: PER, EOMI, sclera nonicteric.  Neck: Supple, no masses.  No bruit or  JVD.  Pulmonary:  Good air movement, no audible wheezing, no use of accessory muscles.  Cardiac: RRR, normal S1, S2, no Murmurs. Vascular: Moderate trophic changes,  open wounds with the left fifth toe appearing ischemic Vessel Right Left  Radial Palpable Palpable  PT Not Palpable Not Palpable  DP Not Palpable Not Palpable  Gastrointestinal: soft, non-distended. No guarding/no peritoneal signs.  Musculoskeletal: M/S 5/5 throughout.  No visible deformity.  Neurologic: CN 2-12 intact. Pain and light touch intact in extremities.  Symmetrical.  Speech is fluent. Motor exam as listed above. Psychiatric: Judgment intact, Mood & affect appropriate for pt's clinical situation. Dermatologic: No rashes or ulcers noted.  No changes consistent with cellulitis.   CBC Lab Results  Component Value Date   WBC 10.6 (H) 04/27/2023   HGB 13.7 04/27/2023   HCT 37.9 04/27/2023   MCV 84.6 04/27/2023   PLT 201 04/27/2023    BMET    Component Value Date/Time   NA 135 04/24/2023 2338   NA 136 08/02/2021 1629   NA 135 11/02/2014 0609   K 3.9 04/24/2023 2338   K 4.2 11/02/2014 0609   CL 104 04/24/2023 2338   CL 107 11/02/2014 0609   CO2 21 (L) 04/24/2023 2338   CO2 23 11/02/2014 0609   GLUCOSE 216 (H) 04/24/2023 2338   GLUCOSE 108 (H) 11/02/2014 0609   BUN 23 04/24/2023 2338   BUN 25 08/02/2021 1629   BUN 20 11/02/2014 0609   CREATININE 0.78 04/24/2023 2338   CREATININE 1.01 11/09/2015 1549   CREATININE 1.01 11/09/2015 1549   CALCIUM 7.8 (L) 04/24/2023 2338   CALCIUM 7.3 (L) 11/02/2014 0609   GFRNONAA >60 04/24/2023 2338   GFRNONAA 50 (L) 11/02/2014 0609   GFRAA >60 01/19/2019 0357   GFRAA 58 (L) 11/02/2014 0609   Estimated Creatinine Clearance: 68.6 mL/min (by C-G formula based on SCr of 0.78 mg/dL).  COAG Lab Results  Component Value Date   INR 0.9 10/17/2014   INR 1.1 01/19/2014   INR 0.9 12/06/2013    Radiology PERIPHERAL VASCULAR CATHETERIZATION  Result Date:  04/24/2023 See surgical note for result.  VAS [place] LOWER EXTREMITY ARTERIAL DUPLEX  Result Date: 04/23/2023 LOWER EXTREMITY ARTERIAL DUPLEX STUDY Patient Name:  Laura Reed  Date of Exam:   04/22/2023 Medical Rec #: [id number]        Accession #:    [id number] Date of Birth: 12-11-1960        Patient Gender: F Patient Age:   62 years Exam Location:  [facility] Procedure:      VAS [place] LOWER EXTREMITY ARTERIAL DUPLEX Referring Phys: Sheppard Plumber --------------------------------------------------------------------------------  Indications: Peripheral artery disease. Other Factors: H/O right 4th & 5th and left 1st & 3rd toe amputations                12/03/2020 left pop thrombectomy and new stent.  Vascular Interventions: H/O left-to-right fem-fem BPG with lower extremity                         stents & BPGs in [place];  MRN : [id number]  Laura Reed is a 62 y.o. (June 07, 1961) female who presents with chief complaint of check circulation.  History of Present Illness:   Patient in bed appears comfortable upon entering the room.  She was able to ambulate today and states that her foot is feeling better.  She does have some pain particularly with ambulation.  She also notes that her left fifth toe appears worse than it did before.  No fevers chills or sweats.  Current Meds  Medication Sig   acetaminophen (TYLENOL) 500 MG tablet Take 1,000 mg by mouth every 6 (six) hours as needed for mild pain or headache.    ALPRAZolam (XANAX) 0.5 MG tablet TAKE 1 TABLET(0.5 MG) BY MOUTH DAILY AS NEEDED FOR ANXIETY   amLODipine (NORVASC) 2.5 MG tablet TAKE 1 TABLET(2.5 MG) BY MOUTH DAILY   aspirin 81 MG chewable tablet Chew 81 mg by mouth at bedtime.    atorvastatin (LIPITOR) 20 MG tablet TAKE 1 TABLET(20 MG) BY MOUTH DAILY   Cholecalciferol (VITAMIN D3) 2000 UNITS TABS Take 2,000 Units by mouth daily after supper.    clopidogrel (PLAVIX) 75 MG tablet TAKE 1 TABLET(75 MG) BY MOUTH DAILY   Continuous Blood Gluc Sensor (FREESTYLE LIBRE 2 SENSOR) MISC Use to check sugar at least 4 times daily   empagliflozin (JARDIANCE) 25 MG TABS tablet TAKE 1 TABLET(25 MG) BY MOUTH DAILY BEFORE AND BREAKFAST   gabapentin (NEURONTIN) 800 MG tablet TAKE 1 TABLET(800 MG) BY MOUTH THREE TIMES DAILY   glucose blood test strip Use you check blood sugars up to four times daily.   insulin degludec (TRESIBA FLEXTOUCH) 100 UNIT/ML FlexTouch Pen Inject 20 Units into the skin daily.   insulin lispro (HUMALOG KWIKPEN) 100 UNIT/ML KwikPen ADMINISTER 10 UNITS UNDER THE SKIN THREE TIMES DAILY BEFORE MEALS (Patient taking differently: ADMINISTER 10 UNITS UNDER THE SKIN THREE TIMES DAILY BEFORE MEALS PRN)   Insulin Pen Needle (PEN NEEDLES) 32G X 4 MM MISC Use to take insulin daily    losartan (COZAAR) 100 MG tablet Take 1 tablet (100 mg total) by mouth daily.   Multiple Vitamin (MULTIVITAMIN) tablet Take 1 tablet by mouth daily.   multivitamin-lutein (OCUVITE-LUTEIN) CAPS capsule Take 1 capsule by mouth daily.   triamcinolone ointment (KENALOG) 0.5 % Apply 1 Application topically 2 (two) times daily.    Past Medical History:  Diagnosis Date   Absence of kidney    left   Anxiety    Arthritis    Bladder cancer (HCC)    CHF (congestive heart failure) (HCC)    Complication of anesthesia    BP HAS  RUN LOW AFTER SURGERY-LUNGS FILLED UP WITH FLUID AFTER  LEG STENT SURGERY    Coronary artery disease    Diabetes mellitus    Family history of adverse reaction to anesthesia    Sister - PONV   GERD (gastroesophageal reflux disease)    OCC TUMS   Heart murmur    Hemorrhoid    History of methicillin resistant staphylococcus aureus (MRSA) 2007   Hypertension    Neuropathy    PVD (  MRN : [id number]  Laura Reed is a 62 y.o. (June 07, 1961) female who presents with chief complaint of check circulation.  History of Present Illness:   Patient in bed appears comfortable upon entering the room.  She was able to ambulate today and states that her foot is feeling better.  She does have some pain particularly with ambulation.  She also notes that her left fifth toe appears worse than it did before.  No fevers chills or sweats.  Current Meds  Medication Sig   acetaminophen (TYLENOL) 500 MG tablet Take 1,000 mg by mouth every 6 (six) hours as needed for mild pain or headache.    ALPRAZolam (XANAX) 0.5 MG tablet TAKE 1 TABLET(0.5 MG) BY MOUTH DAILY AS NEEDED FOR ANXIETY   amLODipine (NORVASC) 2.5 MG tablet TAKE 1 TABLET(2.5 MG) BY MOUTH DAILY   aspirin 81 MG chewable tablet Chew 81 mg by mouth at bedtime.    atorvastatin (LIPITOR) 20 MG tablet TAKE 1 TABLET(20 MG) BY MOUTH DAILY   Cholecalciferol (VITAMIN D3) 2000 UNITS TABS Take 2,000 Units by mouth daily after supper.    clopidogrel (PLAVIX) 75 MG tablet TAKE 1 TABLET(75 MG) BY MOUTH DAILY   Continuous Blood Gluc Sensor (FREESTYLE LIBRE 2 SENSOR) MISC Use to check sugar at least 4 times daily   empagliflozin (JARDIANCE) 25 MG TABS tablet TAKE 1 TABLET(25 MG) BY MOUTH DAILY BEFORE AND BREAKFAST   gabapentin (NEURONTIN) 800 MG tablet TAKE 1 TABLET(800 MG) BY MOUTH THREE TIMES DAILY   glucose blood test strip Use you check blood sugars up to four times daily.   insulin degludec (TRESIBA FLEXTOUCH) 100 UNIT/ML FlexTouch Pen Inject 20 Units into the skin daily.   insulin lispro (HUMALOG KWIKPEN) 100 UNIT/ML KwikPen ADMINISTER 10 UNITS UNDER THE SKIN THREE TIMES DAILY BEFORE MEALS (Patient taking differently: ADMINISTER 10 UNITS UNDER THE SKIN THREE TIMES DAILY BEFORE MEALS PRN)   Insulin Pen Needle (PEN NEEDLES) 32G X 4 MM MISC Use to take insulin daily    losartan (COZAAR) 100 MG tablet Take 1 tablet (100 mg total) by mouth daily.   Multiple Vitamin (MULTIVITAMIN) tablet Take 1 tablet by mouth daily.   multivitamin-lutein (OCUVITE-LUTEIN) CAPS capsule Take 1 capsule by mouth daily.   triamcinolone ointment (KENALOG) 0.5 % Apply 1 Application topically 2 (two) times daily.    Past Medical History:  Diagnosis Date   Absence of kidney    left   Anxiety    Arthritis    Bladder cancer (HCC)    CHF (congestive heart failure) (HCC)    Complication of anesthesia    BP HAS  RUN LOW AFTER SURGERY-LUNGS FILLED UP WITH FLUID AFTER  LEG STENT SURGERY    Coronary artery disease    Diabetes mellitus    Family history of adverse reaction to anesthesia    Sister - PONV   GERD (gastroesophageal reflux disease)    OCC TUMS   Heart murmur    Hemorrhoid    History of methicillin resistant staphylococcus aureus (MRSA) 2007   Hypertension    Neuropathy    PVD (  MRN : [id number]  Laura Reed is a 62 y.o. (June 07, 1961) female who presents with chief complaint of check circulation.  History of Present Illness:   Patient in bed appears comfortable upon entering the room.  She was able to ambulate today and states that her foot is feeling better.  She does have some pain particularly with ambulation.  She also notes that her left fifth toe appears worse than it did before.  No fevers chills or sweats.  Current Meds  Medication Sig   acetaminophen (TYLENOL) 500 MG tablet Take 1,000 mg by mouth every 6 (six) hours as needed for mild pain or headache.    ALPRAZolam (XANAX) 0.5 MG tablet TAKE 1 TABLET(0.5 MG) BY MOUTH DAILY AS NEEDED FOR ANXIETY   amLODipine (NORVASC) 2.5 MG tablet TAKE 1 TABLET(2.5 MG) BY MOUTH DAILY   aspirin 81 MG chewable tablet Chew 81 mg by mouth at bedtime.    atorvastatin (LIPITOR) 20 MG tablet TAKE 1 TABLET(20 MG) BY MOUTH DAILY   Cholecalciferol (VITAMIN D3) 2000 UNITS TABS Take 2,000 Units by mouth daily after supper.    clopidogrel (PLAVIX) 75 MG tablet TAKE 1 TABLET(75 MG) BY MOUTH DAILY   Continuous Blood Gluc Sensor (FREESTYLE LIBRE 2 SENSOR) MISC Use to check sugar at least 4 times daily   empagliflozin (JARDIANCE) 25 MG TABS tablet TAKE 1 TABLET(25 MG) BY MOUTH DAILY BEFORE AND BREAKFAST   gabapentin (NEURONTIN) 800 MG tablet TAKE 1 TABLET(800 MG) BY MOUTH THREE TIMES DAILY   glucose blood test strip Use you check blood sugars up to four times daily.   insulin degludec (TRESIBA FLEXTOUCH) 100 UNIT/ML FlexTouch Pen Inject 20 Units into the skin daily.   insulin lispro (HUMALOG KWIKPEN) 100 UNIT/ML KwikPen ADMINISTER 10 UNITS UNDER THE SKIN THREE TIMES DAILY BEFORE MEALS (Patient taking differently: ADMINISTER 10 UNITS UNDER THE SKIN THREE TIMES DAILY BEFORE MEALS PRN)   Insulin Pen Needle (PEN NEEDLES) 32G X 4 MM MISC Use to take insulin daily    losartan (COZAAR) 100 MG tablet Take 1 tablet (100 mg total) by mouth daily.   Multiple Vitamin (MULTIVITAMIN) tablet Take 1 tablet by mouth daily.   multivitamin-lutein (OCUVITE-LUTEIN) CAPS capsule Take 1 capsule by mouth daily.   triamcinolone ointment (KENALOG) 0.5 % Apply 1 Application topically 2 (two) times daily.    Past Medical History:  Diagnosis Date   Absence of kidney    left   Anxiety    Arthritis    Bladder cancer (HCC)    CHF (congestive heart failure) (HCC)    Complication of anesthesia    BP HAS  RUN LOW AFTER SURGERY-LUNGS FILLED UP WITH FLUID AFTER  LEG STENT SURGERY    Coronary artery disease    Diabetes mellitus    Family history of adverse reaction to anesthesia    Sister - PONV   GERD (gastroesophageal reflux disease)    OCC TUMS   Heart murmur    Hemorrhoid    History of methicillin resistant staphylococcus aureus (MRSA) 2007   Hypertension    Neuropathy    PVD (  MRN : [id number]  Laura Reed is a 62 y.o. (June 07, 1961) female who presents with chief complaint of check circulation.  History of Present Illness:   Patient in bed appears comfortable upon entering the room.  She was able to ambulate today and states that her foot is feeling better.  She does have some pain particularly with ambulation.  She also notes that her left fifth toe appears worse than it did before.  No fevers chills or sweats.  Current Meds  Medication Sig   acetaminophen (TYLENOL) 500 MG tablet Take 1,000 mg by mouth every 6 (six) hours as needed for mild pain or headache.    ALPRAZolam (XANAX) 0.5 MG tablet TAKE 1 TABLET(0.5 MG) BY MOUTH DAILY AS NEEDED FOR ANXIETY   amLODipine (NORVASC) 2.5 MG tablet TAKE 1 TABLET(2.5 MG) BY MOUTH DAILY   aspirin 81 MG chewable tablet Chew 81 mg by mouth at bedtime.    atorvastatin (LIPITOR) 20 MG tablet TAKE 1 TABLET(20 MG) BY MOUTH DAILY   Cholecalciferol (VITAMIN D3) 2000 UNITS TABS Take 2,000 Units by mouth daily after supper.    clopidogrel (PLAVIX) 75 MG tablet TAKE 1 TABLET(75 MG) BY MOUTH DAILY   Continuous Blood Gluc Sensor (FREESTYLE LIBRE 2 SENSOR) MISC Use to check sugar at least 4 times daily   empagliflozin (JARDIANCE) 25 MG TABS tablet TAKE 1 TABLET(25 MG) BY MOUTH DAILY BEFORE AND BREAKFAST   gabapentin (NEURONTIN) 800 MG tablet TAKE 1 TABLET(800 MG) BY MOUTH THREE TIMES DAILY   glucose blood test strip Use you check blood sugars up to four times daily.   insulin degludec (TRESIBA FLEXTOUCH) 100 UNIT/ML FlexTouch Pen Inject 20 Units into the skin daily.   insulin lispro (HUMALOG KWIKPEN) 100 UNIT/ML KwikPen ADMINISTER 10 UNITS UNDER THE SKIN THREE TIMES DAILY BEFORE MEALS (Patient taking differently: ADMINISTER 10 UNITS UNDER THE SKIN THREE TIMES DAILY BEFORE MEALS PRN)   Insulin Pen Needle (PEN NEEDLES) 32G X 4 MM MISC Use to take insulin daily    losartan (COZAAR) 100 MG tablet Take 1 tablet (100 mg total) by mouth daily.   Multiple Vitamin (MULTIVITAMIN) tablet Take 1 tablet by mouth daily.   multivitamin-lutein (OCUVITE-LUTEIN) CAPS capsule Take 1 capsule by mouth daily.   triamcinolone ointment (KENALOG) 0.5 % Apply 1 Application topically 2 (two) times daily.    Past Medical History:  Diagnosis Date   Absence of kidney    left   Anxiety    Arthritis    Bladder cancer (HCC)    CHF (congestive heart failure) (HCC)    Complication of anesthesia    BP HAS  RUN LOW AFTER SURGERY-LUNGS FILLED UP WITH FLUID AFTER  LEG STENT SURGERY    Coronary artery disease    Diabetes mellitus    Family history of adverse reaction to anesthesia    Sister - PONV   GERD (gastroesophageal reflux disease)    OCC TUMS   Heart murmur    Hemorrhoid    History of methicillin resistant staphylococcus aureus (MRSA) 2007   Hypertension    Neuropathy    PVD (  Oral   Oral  SpO2: 94% 98%  100%  Weight:      Height:       Body mass index is 21.13 kg/m. Gen: WD/WN, NAD Head: Bruce/AT, No temporalis wasting.  Ear/Nose/Throat: Hearing grossly intact, nares w/o erythema or drainage Eyes: PER, EOMI, sclera nonicteric.  Neck: Supple, no masses.  No bruit or  JVD.  Pulmonary:  Good air movement, no audible wheezing, no use of accessory muscles.  Cardiac: RRR, normal S1, S2, no Murmurs. Vascular: Moderate trophic changes,  open wounds with the left fifth toe appearing ischemic Vessel Right Left  Radial Palpable Palpable  PT Not Palpable Not Palpable  DP Not Palpable Not Palpable  Gastrointestinal: soft, non-distended. No guarding/no peritoneal signs.  Musculoskeletal: M/S 5/5 throughout.  No visible deformity.  Neurologic: CN 2-12 intact. Pain and light touch intact in extremities.  Symmetrical.  Speech is fluent. Motor exam as listed above. Psychiatric: Judgment intact, Mood & affect appropriate for pt's clinical situation. Dermatologic: No rashes or ulcers noted.  No changes consistent with cellulitis.   CBC Lab Results  Component Value Date   WBC 10.6 (H) 04/27/2023   HGB 13.7 04/27/2023   HCT 37.9 04/27/2023   MCV 84.6 04/27/2023   PLT 201 04/27/2023    BMET    Component Value Date/Time   NA 135 04/24/2023 2338   NA 136 08/02/2021 1629   NA 135 11/02/2014 0609   K 3.9 04/24/2023 2338   K 4.2 11/02/2014 0609   CL 104 04/24/2023 2338   CL 107 11/02/2014 0609   CO2 21 (L) 04/24/2023 2338   CO2 23 11/02/2014 0609   GLUCOSE 216 (H) 04/24/2023 2338   GLUCOSE 108 (H) 11/02/2014 0609   BUN 23 04/24/2023 2338   BUN 25 08/02/2021 1629   BUN 20 11/02/2014 0609   CREATININE 0.78 04/24/2023 2338   CREATININE 1.01 11/09/2015 1549   CREATININE 1.01 11/09/2015 1549   CALCIUM 7.8 (L) 04/24/2023 2338   CALCIUM 7.3 (L) 11/02/2014 0609   GFRNONAA >60 04/24/2023 2338   GFRNONAA 50 (L) 11/02/2014 0609   GFRAA >60 01/19/2019 0357   GFRAA 58 (L) 11/02/2014 0609   Estimated Creatinine Clearance: 68.6 mL/min (by C-G formula based on SCr of 0.78 mg/dL).  COAG Lab Results  Component Value Date   INR 0.9 10/17/2014   INR 1.1 01/19/2014   INR 0.9 12/06/2013    Radiology PERIPHERAL VASCULAR CATHETERIZATION  Result Date:  04/24/2023 See surgical note for result.  VAS [place] LOWER EXTREMITY ARTERIAL DUPLEX  Result Date: 04/23/2023 LOWER EXTREMITY ARTERIAL DUPLEX STUDY Patient Name:  Laura Reed  Date of Exam:   04/22/2023 Medical Rec #: [id number]        Accession #:    [id number] Date of Birth: 12-11-1960        Patient Gender: F Patient Age:   62 years Exam Location:  [facility] Procedure:      VAS [place] LOWER EXTREMITY ARTERIAL DUPLEX Referring Phys: Sheppard Plumber --------------------------------------------------------------------------------  Indications: Peripheral artery disease. Other Factors: H/O right 4th & 5th and left 1st & 3rd toe amputations                12/03/2020 left pop thrombectomy and new stent.  Vascular Interventions: H/O left-to-right fem-fem BPG with lower extremity                         stents & BPGs in [place];

## 2023-04-27 NOTE — Progress Notes (Signed)
Mobility Specialist - Progress Note    04/27/23 1501  Therapy Vitals  BP 107/75  Mobility  Activity Ambulated with assistance in hallway  Level of Assistance Contact guard assist, steadying assist  Assistive Device Front wheel walker  Distance Ambulated (ft) 30 ft  Range of Motion/Exercises Active  Activity Response Tolerated well  Mobility Referral Yes  $Mobility charge 1 Mobility  Mobility Specialist Start Time (ACUTE ONLY) 1437  Mobility Specialist Stop Time (ACUTE ONLY) 1501  Mobility Specialist Time Calculation (min) (ACUTE ONLY) 24 min   Pt resting in bed on RA upon entry. Pt STS and ambulates to hallway beside b pod CGA with RW. Pt is WBAT on left foot. Pt maintains slow gait with guarding. Pt returned to bed and left with needs in reach.   Johnathan Hausen Mobility Specialist 04/27/23, 3:04 PM

## 2023-04-27 NOTE — Plan of Care (Signed)
  Problem: Health Behavior/Discharge Planning: Goal: Ability to manage health-related needs will improve Outcome: Progressing   

## 2023-04-27 NOTE — Telephone Encounter (Signed)
noted 

## 2023-04-27 NOTE — Telephone Encounter (Signed)
Patient cancelled her appointment 04/30/2023 with Dr. Duncan Dull via MyChart, stating the following reason:  "I am in the hospital and will have vascular surgery on Wednesday"

## 2023-04-27 NOTE — Telephone Encounter (Signed)
Appt canceled!

## 2023-04-28 LAB — CBC
HCT: 40.1 % (ref 36.0–46.0)
Hemoglobin: 14.2 g/dL (ref 12.0–15.0)
MCH: 30.7 pg (ref 26.0–34.0)
MCHC: 35.4 g/dL (ref 30.0–36.0)
MCV: 86.8 fL (ref 80.0–100.0)
Platelets: 204 10*3/uL (ref 150–400)
RBC: 4.62 MIL/uL (ref 3.87–5.11)
RDW: 13.6 % (ref 11.5–15.5)
WBC: 10.7 10*3/uL — ABNORMAL HIGH (ref 4.0–10.5)
nRBC: 0 % (ref 0.0–0.2)

## 2023-04-28 LAB — GLUCOSE, CAPILLARY
Glucose-Capillary: 234 mg/dL — ABNORMAL HIGH (ref 70–99)
Glucose-Capillary: 164 mg/dL — ABNORMAL HIGH (ref 70–99)
Glucose-Capillary: 180 mg/dL — ABNORMAL HIGH (ref 70–99)

## 2023-04-28 LAB — TYPE AND SCREEN
ABO/RH(D): A POS
Antibody Screen: NEGATIVE

## 2023-04-28 LAB — HEPARIN LEVEL (UNFRACTIONATED): Heparin Unfractionated: 0.49 [IU]/mL (ref 0.30–0.70)

## 2023-04-28 LAB — ABO/RH: ABO/RH(D): A POS

## 2023-04-28 NOTE — Plan of Care (Signed)
  Problem: Clinical Measurements: Goal: Ability to maintain clinical measurements within normal limits will improve Outcome: Progressing Goal: Diagnostic test results will improve Outcome: Progressing Goal: Respiratory complications will improve Outcome: Progressing Goal: Cardiovascular complication will be avoided Outcome: Progressing   Problem: Activity: Goal: Risk for activity intolerance will decrease Outcome: Progressing

## 2023-04-28 NOTE — Progress Notes (Signed)
PHARMACY - ANTICOAGULATION CONSULT NOTE  Pharmacy Consult for Heparin Infusion Indication:  Post lower extremity thrombectomy  No Known Allergies  Patient Measurements: Height: 5\' 7"  (170.2 cm) Weight: 61.2 kg (134 lb 14.7 oz) IBW/kg (Calculated) : 61.6 Heparin Dosing Weight: 61.2 kg  Vital Signs: Temp: 98.3 F (36.8 C) (10/22 0412) Temp Source: Oral (10/22 0412) BP: 150/54 (10/22 0412) Pulse Rate: 68 (10/22 0412)  Labs: Recent Labs    04/25/23 2145 04/26/23 0649 04/26/23 0649 04/26/23 1811 04/27/23 0456  HGB  --  13.1   < > 13.9 13.7  HCT  --  38.0  --  40.5 37.9  PLT  --  192  --   --  201  HEPARINUNFRC 0.62 0.68  --   --  0.59   < > = values in this interval not displayed.    Estimated Creatinine Clearance: 68.6 mL/min (by C-G formula based on SCr of 0.78 mg/dL).   Medical History: Past Medical History:  Diagnosis Date   Absence of kidney    left   Anxiety    Arthritis    Bladder cancer (HCC)    CHF (congestive heart failure) (HCC)    Complication of anesthesia    BP HAS  RUN LOW AFTER SURGERY-LUNGS FILLED UP WITH FLUID AFTER  LEG STENT SURGERY    Coronary artery disease    Diabetes mellitus    Family history of adverse reaction to anesthesia    Sister - PONV   GERD (gastroesophageal reflux disease)    OCC TUMS   Heart murmur    Hemorrhoid    History of methicillin resistant staphylococcus aureus (MRSA) 2007   Hypertension    Neuropathy    PVD (peripheral vascular disease) (HCC)    Thyroid nodule    right   Urothelial carcinoma of kidney (HCC) 10/31/2014   INVASIVE UROTHELIAL CARCINOMA, LOW GRADE. T1, Nx.   Wears dentures    full upper and lower    Assessment: Patient is a 62yo female with left lower extremity ischemia, she is s/p mechanical thrombectomy. Pharmacy consulted for heparin Infusion.   Goal of Therapy:  Heparin level 0.3-0.7 units/ml Monitor platelets by anticoagulation protocol: Yes   Plan:  heparin level remains therapeutic.  ---Will continue heparin infusion at 1150 units/hr.  ---Recheck heparin level and CBC with AM labs.   Burnis Medin, PharmD, BCPS 04/28/2023 7:03 AM

## 2023-04-28 NOTE — Progress Notes (Signed)
Marland Kitchen  Transition of Care [facility]) - Inpatient Brief Assessment   Patient Details  Name: Laura Reed MRN: [id number] Date of Birth: 09-Aug-1960  Transition of Care [facility] CM/SW Contact:    Truddie Hidden, RN Phone Number: 04/28/2023, 1:35 PM   Clinical Narrative: TOC continuing to follow patient's progress throughout discharge planning.   Transition of Care Asessment: Insurance and Status: Insurance coverage has been reviewed Patient has primary care physician: Yes Home environment has been reviewed: home Prior level of function:: independent Prior/Current Home Services: No current home services Social Determinants of Health Reivew: SDOH reviewed no interventions necessary Readmission risk has been reviewed: Yes Transition of care needs: no transition of care needs at this time

## 2023-04-29 ENCOUNTER — Encounter
Admission: RE | Disposition: A | Discharge status: Skilled Nursing Facility | Payer: Self-pay | Source: Home / Self Care | Attending: Vascular Surgery

## 2023-04-29 ENCOUNTER — Other Ambulatory Visit: Payer: Self-pay

## 2023-04-29 ENCOUNTER — Inpatient Hospital Stay: Payer: 59 | Admitting: Anesthesiology

## 2023-04-29 ENCOUNTER — Encounter: Payer: Self-pay | Admitting: Vascular Surgery

## 2023-04-29 DIAGNOSIS — I70262 Atherosclerosis of native arteries of extremities with gangrene, left leg: Secondary | ICD-10-CM | POA: Diagnosis not present

## 2023-04-29 DIAGNOSIS — Z9889 Other specified postprocedural states: Secondary | ICD-10-CM | POA: Diagnosis not present

## 2023-04-29 DIAGNOSIS — L97528 Non-pressure chronic ulcer of other part of left foot with other specified severity: Secondary | ICD-10-CM | POA: Diagnosis not present

## 2023-04-29 HISTORY — PX: FEMORAL-POPLITEAL BYPASS GRAFT: SHX937

## 2023-04-29 LAB — CBC
HCT: 39.1 % (ref 36.0–46.0)
Hemoglobin: 13.7 g/dL (ref 12.0–15.0)
MCH: 30.4 pg (ref 26.0–34.0)
MCHC: 35 g/dL (ref 30.0–36.0)
MCV: 86.9 fL (ref 80.0–100.0)
Platelets: 226 10*3/uL (ref 150–400)
RBC: 4.5 MIL/uL (ref 3.87–5.11)
RDW: 13.6 % (ref 11.5–15.5)
WBC: 9.8 10*3/uL (ref 4.0–10.5)
nRBC: 0 % (ref 0.0–0.2)

## 2023-04-29 LAB — GLUCOSE, CAPILLARY
Glucose-Capillary: 158 mg/dL — ABNORMAL HIGH (ref 70–99)
Glucose-Capillary: 171 mg/dL — ABNORMAL HIGH (ref 70–99)
Glucose-Capillary: 182 mg/dL — ABNORMAL HIGH (ref 70–99)
Glucose-Capillary: 205 mg/dL — ABNORMAL HIGH (ref 70–99)
Glucose-Capillary: 235 mg/dL — ABNORMAL HIGH (ref 70–99)

## 2023-04-29 LAB — HEPARIN LEVEL (UNFRACTIONATED): Heparin Unfractionated: 0.36 [IU]/mL (ref 0.30–0.70)

## 2023-04-29 SURGERY — BYPASS GRAFT FEMORAL-POPLITEAL ARTERY
Anesthesia: General | Site: Leg Upper | Laterality: Left

## 2023-04-29 MED ORDER — CEFAZOLIN SODIUM-DEXTROSE 2-4 GM/100ML-% IV SOLN
INTRAVENOUS | Status: AC
  Filled 2023-04-29: qty 100

## 2023-04-29 MED ORDER — HEPARIN SODIUM (PORCINE) 1000 UNIT/ML IJ SOLN
INTRAMUSCULAR | Status: AC
  Filled 2023-04-29: qty 10

## 2023-04-29 MED ORDER — ONDANSETRON HCL 4 MG/2ML IJ SOLN: 4.0000 mg | Freq: Four times a day (QID) | INTRAMUSCULAR | Status: DC | PRN

## 2023-04-29 MED ORDER — PHENYLEPHRINE HCL (PRESSORS) 10 MG/ML IV SOLN
INTRAVENOUS | Status: AC
Start: 2023-04-29 — End: ?
  Filled 2023-04-29: qty 1

## 2023-04-29 MED ORDER — MIDAZOLAM HCL 2 MG/2ML IJ SOLN
INTRAMUSCULAR | Status: AC
  Filled 2023-04-29: qty 2

## 2023-04-29 MED ORDER — CEFAZOLIN SODIUM-DEXTROSE 2-4 GM/100ML-% IV SOLN
2.0000 g | Freq: Three times a day (TID) | INTRAVENOUS | Status: AC
  Administered 2023-04-29 – 2023-04-30 (×2): 2 g via INTRAVENOUS
  Filled 2023-04-29 (×2): qty 100

## 2023-04-29 MED ORDER — FENTANYL CITRATE (PF) 100 MCG/2ML IJ SOLN
INTRAMUSCULAR | Status: AC
  Filled 2023-04-29: qty 2

## 2023-04-29 MED ORDER — DEXAMETHASONE SODIUM PHOSPHATE 10 MG/ML IJ SOLN
INTRAMUSCULAR | Status: AC
  Filled 2023-04-29: qty 1

## 2023-04-29 MED ORDER — MIDAZOLAM HCL 2 MG/2ML IJ SOLN
INTRAMUSCULAR | Status: DC | PRN
  Administered 2023-04-29: 2 mg via INTRAVENOUS

## 2023-04-29 MED ORDER — ESMOLOL HCL 100 MG/10ML IV SOLN
INTRAVENOUS | Status: DC | PRN
  Administered 2023-04-29: 30 mg via INTRAVENOUS

## 2023-04-29 MED ORDER — HYDRALAZINE HCL 20 MG/ML IJ SOLN
5.0000 mg | INTRAMUSCULAR | Status: DC | PRN
  Administered 2023-05-02: 5 mg via INTRAVENOUS
  Filled 2023-04-29: qty 1

## 2023-04-29 MED ORDER — HEMOSTATIC AGENTS (NO CHARGE) OPTIME
TOPICAL | Status: DC | PRN
  Administered 2023-04-29: 1 via TOPICAL

## 2023-04-29 MED ORDER — VANCOMYCIN HCL 1000 MG IV SOLR
INTRAVENOUS | Status: AC
  Filled 2023-04-29: qty 20

## 2023-04-29 MED ORDER — VISTASEAL 4 ML SINGLE DOSE KIT
PACK | CUTANEOUS | Status: AC
  Filled 2023-04-29: qty 4

## 2023-04-29 MED ORDER — DEXAMETHASONE SODIUM PHOSPHATE 10 MG/ML IJ SOLN
INTRAMUSCULAR | Status: DC | PRN
  Administered 2023-04-29: 5 mg via INTRAVENOUS

## 2023-04-29 MED ORDER — DOCUSATE SODIUM 100 MG PO CAPS
100.0000 mg | ORAL_CAPSULE | Freq: Every day | ORAL | Status: DC
Start: 2023-04-30 — End: 2023-05-11
  Administered 2023-04-30 – 2023-05-11 (×11): 100 mg via ORAL
  Filled 2023-04-29 (×11): qty 1

## 2023-04-29 MED ORDER — CEFAZOLIN SODIUM-DEXTROSE 2-4 GM/100ML-% IV SOLN
2.0000 g | INTRAVENOUS | Status: AC
  Administered 2023-04-29: 2 g via INTRAVENOUS
  Filled 2023-04-29: qty 100

## 2023-04-29 MED ORDER — CHLORHEXIDINE GLUCONATE CLOTH 2 % EX PADS: 6.0000 | MEDICATED_PAD | Freq: Once | CUTANEOUS | Status: DC

## 2023-04-29 MED ORDER — PROPOFOL 10 MG/ML IV BOLUS
INTRAVENOUS | Status: DC | PRN
  Administered 2023-04-29: 130 mg via INTRAVENOUS
  Administered 2023-04-29: 50 mg via INTRAVENOUS

## 2023-04-29 MED ORDER — VISTASEAL 4 ML SINGLE DOSE KIT
PACK | CUTANEOUS | Status: DC | PRN
  Administered 2023-04-29: 4 mL via TOPICAL

## 2023-04-29 MED ORDER — PROPOFOL 10 MG/ML IV BOLUS
INTRAVENOUS | Status: AC
Start: 2023-04-29 — End: ?
  Filled 2023-04-29: qty 20

## 2023-04-29 MED ORDER — ACETAMINOPHEN 650 MG RE SUPP: 325.0000 mg | RECTAL | Status: DC | PRN

## 2023-04-29 MED ORDER — PHENYLEPHRINE HCL-NACL 20-0.9 MG/250ML-% IV SOLN
INTRAVENOUS | Status: AC
Start: 2023-04-29 — End: ?
  Filled 2023-04-29: qty 250

## 2023-04-29 MED ORDER — PHENYLEPHRINE 80 MCG/ML (10ML) SYRINGE FOR IV PUSH (FOR BLOOD PRESSURE SUPPORT)
PREFILLED_SYRINGE | INTRAVENOUS | Status: DC | PRN
  Administered 2023-04-29: 160 ug via INTRAVENOUS

## 2023-04-29 MED ORDER — GENTAMICIN SULFATE 40 MG/ML IJ SOLN
INTRAMUSCULAR | Status: AC
  Filled 2023-04-29: qty 2

## 2023-04-29 MED ORDER — SODIUM CHLORIDE 0.9 % IV SOLN
INTRAVENOUS | Status: DC | PRN
Start: 2023-04-29 — End: 2023-04-29

## 2023-04-29 MED ORDER — ACETAMINOPHEN 325 MG PO TABS
325.0000 mg | ORAL_TABLET | ORAL | Status: DC | PRN
  Administered 2023-05-03 – 2023-05-09 (×3): 650 mg via ORAL
  Filled 2023-04-29 (×3): qty 2

## 2023-04-29 MED ORDER — HEPARIN 30,000 UNITS/1000 ML (OHS) CELLSAVER SOLUTION
Status: AC
  Filled 2023-04-29: qty 1000

## 2023-04-29 MED ORDER — CLOPIDOGREL BISULFATE 75 MG PO TABS
75.0000 mg | ORAL_TABLET | Freq: Every day | ORAL | Status: DC
  Administered 2023-04-30 – 2023-05-11 (×11): 75 mg via ORAL
  Filled 2023-04-29 (×11): qty 1

## 2023-04-29 MED ORDER — HEPARIN SODIUM (PORCINE) 5000 UNIT/ML IJ SOLN
INTRAMUSCULAR | Status: AC
  Filled 2023-04-29: qty 1

## 2023-04-29 MED ORDER — ROCURONIUM BROMIDE 10 MG/ML (PF) SYRINGE
PREFILLED_SYRINGE | INTRAVENOUS | Status: AC
Start: 2023-04-29 — End: ?
  Filled 2023-04-29: qty 10

## 2023-04-29 MED ORDER — OXYCODONE HCL 5 MG PO TABS: 5.0000 mg | ORAL_TABLET | Freq: Once | ORAL | Status: DC | PRN

## 2023-04-29 MED ORDER — HYDROMORPHONE HCL 1 MG/ML IJ SOLN: 1.0000 mg | Freq: Once | INTRAMUSCULAR | Status: DC | PRN

## 2023-04-29 MED ORDER — SODIUM CHLORIDE 0.9 % IR SOLN
Status: DC | PRN
  Administered 2023-04-29: 500 mL

## 2023-04-29 MED ORDER — HEPARIN SODIUM (PORCINE) 1000 UNIT/ML IJ SOLN
INTRAMUSCULAR | Status: DC | PRN
  Administered 2023-04-29: 7000 [IU] via INTRAVENOUS

## 2023-04-29 MED ORDER — ALUM & MAG HYDROXIDE-SIMETH 200-200-20 MG/5ML PO SUSP
15.0000 mL | ORAL | Status: DC | PRN
Start: 2023-04-29 — End: 2023-05-11

## 2023-04-29 MED ORDER — FENTANYL CITRATE (PF) 100 MCG/2ML IJ SOLN
INTRAMUSCULAR | Status: DC | PRN
Start: 2023-04-29 — End: 2023-04-29
  Administered 2023-04-29 (×2): 50 ug via INTRAVENOUS
  Administered 2023-04-29: 100 ug via INTRAVENOUS

## 2023-04-29 MED ORDER — MAGNESIUM SULFATE 2 GM/50ML IV SOLN: 2.0000 g | Freq: Every day | INTRAVENOUS | Status: DC | PRN

## 2023-04-29 MED ORDER — SODIUM CHLORIDE (PF) 0.9 % IJ SOLN
INTRAMUSCULAR | Status: AC
  Filled 2023-04-29: qty 20

## 2023-04-29 MED ORDER — SODIUM CHLORIDE 0.9 % IV SOLN: INTRAVENOUS | Status: DC

## 2023-04-29 MED ORDER — CHLORHEXIDINE GLUCONATE 0.12 % MT SOLN: 15.0000 mL | Freq: Once | OROMUCOSAL | Status: AC

## 2023-04-29 MED ORDER — OXYCODONE HCL 5 MG PO TABS
10.0000 mg | ORAL_TABLET | ORAL | Status: DC | PRN
  Administered 2023-04-30 – 2023-05-02 (×4): 10 mg via ORAL
  Filled 2023-04-29 (×4): qty 2

## 2023-04-29 MED ORDER — ONDANSETRON HCL 4 MG/2ML IJ SOLN
INTRAMUSCULAR | Status: AC
  Filled 2023-04-29: qty 2

## 2023-04-29 MED ORDER — SODIUM CHLORIDE 0.9 % IV SOLN
500.0000 mL | Freq: Once | INTRAVENOUS | Status: AC | PRN
  Administered 2023-04-29 – 2023-04-30 (×2): 500 mL via INTRAVENOUS

## 2023-04-29 MED ORDER — PHENYLEPHRINE HCL-NACL 20-0.9 MG/250ML-% IV SOLN
INTRAVENOUS | Status: DC | PRN
Start: 2023-04-29 — End: 2023-04-29
  Administered 2023-04-29: 20 ug/min via INTRAVENOUS

## 2023-04-29 MED ORDER — HEPARIN 30,000 UNITS/1000 ML (OHS) CELLSAVER SOLUTION
Status: AC | PRN
  Administered 2023-04-29: 1

## 2023-04-29 MED ORDER — POTASSIUM CHLORIDE CRYS ER 20 MEQ PO TBCR: 20.0000 meq | EXTENDED_RELEASE_TABLET | Freq: Every day | ORAL | Status: DC | PRN

## 2023-04-29 MED ORDER — VANCOMYCIN HCL 1000 MG IV SOLR
INTRAVENOUS | Status: DC | PRN
  Administered 2023-04-29: 1000 mg via TOPICAL

## 2023-04-29 MED ORDER — DOPAMINE-DEXTROSE 3.2-5 MG/ML-% IV SOLN: 3.0000 ug/kg/min | INTRAVENOUS | Status: DC

## 2023-04-29 MED ORDER — HEPARIN (PORCINE) 25000 UT/250ML-% IV SOLN
600.0000 [IU]/h | INTRAVENOUS | Status: DC
  Administered 2023-04-29: 600 [IU]/h via INTRAVENOUS
  Filled 2023-04-29 (×3): qty 250

## 2023-04-29 MED ORDER — HEPARIN (PORCINE) 25000 UT/250ML-% IV SOLN: 1150.0000 [IU]/h | INTRAVENOUS | Status: DC

## 2023-04-29 MED ORDER — LIDOCAINE HCL (PF) 2 % IJ SOLN
INTRAMUSCULAR | Status: AC
  Filled 2023-04-29: qty 5

## 2023-04-29 MED ORDER — FENTANYL CITRATE (PF) 100 MCG/2ML IJ SOLN
25.0000 ug | INTRAMUSCULAR | Status: DC | PRN
  Administered 2023-04-29: 25 ug via INTRAVENOUS

## 2023-04-29 MED ORDER — ORAL CARE MOUTH RINSE
15.0000 mL | Freq: Once | OROMUCOSAL | Status: AC
  Administered 2023-04-29: 15 mL via OROMUCOSAL

## 2023-04-29 MED ORDER — LIDOCAINE HCL (PF) 2 % IJ SOLN
INTRAMUSCULAR | Status: DC | PRN
  Administered 2023-04-29: 60 mg via INTRADERMAL

## 2023-04-29 MED ORDER — EPHEDRINE SULFATE (PRESSORS) 50 MG/ML IJ SOLN
INTRAMUSCULAR | Status: DC | PRN
  Administered 2023-04-29: 10 mg via INTRAVENOUS
  Administered 2023-04-29: 5 mg via INTRAVENOUS

## 2023-04-29 MED ORDER — OXYCODONE HCL 5 MG/5ML PO SOLN: 5.0000 mg | Freq: Once | ORAL | Status: DC | PRN

## 2023-04-29 MED ORDER — ASPIRIN 81 MG PO TBEC
81.0000 mg | DELAYED_RELEASE_TABLET | Freq: Every day | ORAL | Status: DC
  Administered 2023-04-30 – 2023-05-11 (×11): 81 mg via ORAL
  Filled 2023-04-29 (×12): qty 1

## 2023-04-29 MED ORDER — GLYCOPYRROLATE 0.2 MG/ML IJ SOLN
INTRAMUSCULAR | Status: DC | PRN
  Administered 2023-04-29: .2 mg via INTRAVENOUS

## 2023-04-29 MED ORDER — SUGAMMADEX SODIUM 200 MG/2ML IV SOLN
INTRAVENOUS | Status: DC | PRN
  Administered 2023-04-29: 122.4 mg via INTRAVENOUS

## 2023-04-29 MED ORDER — INSULIN ASPART 100 UNIT/ML IJ SOLN
8.0000 [IU] | Freq: Once | INTRAMUSCULAR | Status: AC
  Administered 2023-04-29: 8 [IU] via SUBCUTANEOUS
  Filled 2023-04-29: qty 1

## 2023-04-29 MED ORDER — FAMOTIDINE IN NACL 20-0.9 MG/50ML-% IV SOLN
20.0000 mg | Freq: Two times a day (BID) | INTRAVENOUS | Status: DC
  Administered 2023-04-29 – 2023-05-01 (×5): 20 mg via INTRAVENOUS
  Filled 2023-04-29 (×6): qty 50

## 2023-04-29 MED ORDER — NITROGLYCERIN IN D5W 200-5 MCG/ML-% IV SOLN: 5.0000 ug/min | INTRAVENOUS | Status: DC

## 2023-04-29 MED ORDER — ROCURONIUM BROMIDE 100 MG/10ML IV SOLN
INTRAVENOUS | Status: DC | PRN
  Administered 2023-04-29: 30 mg via INTRAVENOUS
  Administered 2023-04-29: 50 mg via INTRAVENOUS
  Administered 2023-04-29: 20 mg via INTRAVENOUS

## 2023-04-29 MED ORDER — GENTAMICIN SULFATE 40 MG/ML IJ SOLN
INTRAMUSCULAR | Status: DC | PRN
  Administered 2023-04-29: 80 mg

## 2023-04-29 MED ORDER — CHLORHEXIDINE GLUCONATE CLOTH 2 % EX PADS
6.0000 | MEDICATED_PAD | Freq: Every day | CUTANEOUS | Status: DC
  Administered 2023-04-30 – 2023-05-05 (×6): 6 via TOPICAL

## 2023-04-29 MED ORDER — CHLORHEXIDINE GLUCONATE CLOTH 2 % EX PADS
6.0000 | MEDICATED_PAD | Freq: Once | CUTANEOUS | Status: AC
  Administered 2023-04-29: 6 via TOPICAL

## 2023-04-29 MED ORDER — PHENYLEPHRINE HCL (PRESSORS) 10 MG/ML IV SOLN
INTRAVENOUS | Status: DC | PRN
  Administered 2023-04-29: 100 ug via INTRAVENOUS

## 2023-04-29 SURGICAL SUPPLY — 102 items
5 PAIRS OF YELLOW SUTURE CLAMP (MISCELLANEOUS) ×4
ADH SKN CLS APL DERMABOND .7 (GAUZE/BANDAGES/DRESSINGS) ×4
APL PRP STRL LF DISP 70% ISPRP (MISCELLANEOUS) ×4
APPLIER CLIP 11 MED OPEN (CLIP)
APPLIER CLIP 9.375 SM OPEN (CLIP)
APR CLP MED 11 20 MLT OPN (CLIP)
APR CLP SM 9.3 20 MLT OPN (CLIP)
BAG COUNTER SPONGE SURGICOUNT (BAG) ×2 IMPLANT
BAG DECANTER FOR FLEXI CONT (MISCELLANEOUS) ×2 IMPLANT
BAG ISL LRG 20X20 DRWSTRG (DRAPES) ×2
BAG ISOLATATION DRAPE 20X20 ST (DRAPES) ×2 IMPLANT
BAG SPNG CNTER NS LX DISP (BAG) ×6
BLADE SURG 15 STRL LF DISP TIS (BLADE) ×2 IMPLANT
BLADE SURG 15 STRL SS (BLADE) ×2
BLADE SURG SZ11 CARB STEEL (BLADE) ×2 IMPLANT
BRUSH SCRUB EZ 4% CHG (MISCELLANEOUS) ×2 IMPLANT
CANISTER WOUND CARE 500ML ATS (WOUND CARE) IMPLANT
CAP TUBING WOUND VAC TRAC (MISCELLANEOUS) IMPLANT
CHLORAPREP W/TINT 26 (MISCELLANEOUS) ×4 IMPLANT
CLAMP SUTURE YELLOW 5 PAIRS (MISCELLANEOUS) ×4 IMPLANT
CLIP APPLIE 11 MED OPEN (CLIP) IMPLANT
CLIP APPLIE 9.375 SM OPEN (CLIP) IMPLANT
CONNECTOR Y WND VAC (MISCELLANEOUS) IMPLANT
DERMABOND ADVANCED .7 DNX12 (GAUZE/BANDAGES/DRESSINGS) ×4 IMPLANT
DRAPE INCISE IOBAN 66X45 STRL (DRAPES) ×4 IMPLANT
DRAPE SHEET LG 3/4 BI-LAMINATE (DRAPES) ×2 IMPLANT
DRESSING PEEL AND PLC PRVNA 13 (GAUZE/BANDAGES/DRESSINGS) IMPLANT
DRESSING SURGICEL FIBRLLR 1X2 (HEMOSTASIS) ×2 IMPLANT
DRSG OPSITE POSTOP 4X10 (GAUZE/BANDAGES/DRESSINGS) IMPLANT
DRSG OPSITE POSTOP 4X6 (GAUZE/BANDAGES/DRESSINGS) IMPLANT
DRSG OPSITE POSTOP 4X8 (GAUZE/BANDAGES/DRESSINGS) IMPLANT
DRSG PEEL AND PLACE PREVENA 13 (GAUZE/BANDAGES/DRESSINGS) ×2
DRSG SURGICEL FIBRILLAR 1X2 (HEMOSTASIS) ×2
DRSG VAC GRANUFOAM LG (GAUZE/BANDAGES/DRESSINGS) IMPLANT
ELECT CAUTERY BLADE 6.4 (BLADE) ×2 IMPLANT
ELECT REM PT RETURN 9FT ADLT (ELECTROSURGICAL) ×2
ELECTRODE REM PT RTRN 9FT ADLT (ELECTROSURGICAL) ×2 IMPLANT
GAUZE 4X4 16PLY ~~LOC~~+RFID DBL (SPONGE) ×2 IMPLANT
GEL ULTRASOUND 20GR AQUASONIC (MISCELLANEOUS) IMPLANT
GLOVE BIO SURGEON STRL SZ7 (GLOVE) ×2 IMPLANT
GLOVE SURG SYN 7.0 (GLOVE) ×4 IMPLANT
GLOVE SURG SYN 7.0 PF PI (GLOVE) ×4 IMPLANT
GLOVE SURG SYN 8.0 (GLOVE) ×4 IMPLANT
GLOVE SURG SYN 8.0 PF PI (GLOVE) ×4 IMPLANT
GOWN STRL REUS W/ TWL LRG LVL3 (GOWN DISPOSABLE) ×8 IMPLANT
GOWN STRL REUS W/ TWL XL LVL3 (GOWN DISPOSABLE) ×4 IMPLANT
GOWN STRL REUS W/TWL 2XL LVL3 (GOWN DISPOSABLE) ×2 IMPLANT
GOWN STRL REUS W/TWL LRG LVL3 (GOWN DISPOSABLE) ×8
GOWN STRL REUS W/TWL XL LVL3 (GOWN DISPOSABLE) ×4
GRAFT VASC DISTAFLO 6X80 (Graft) IMPLANT
HEAD CUTTING 'VALVULOTOME URSL (MISCELLANEOUS) IMPLANT
IV NS 500ML (IV SOLUTION) ×2
IV NS 500ML BAXH (IV SOLUTION) ×2 IMPLANT
KIT STIMULAN RAPID CURE 5CC (Orthopedic Implant) IMPLANT
KIT TURNOVER KIT A (KITS) ×2 IMPLANT
LABEL OR SOLS (LABEL) ×2 IMPLANT
LOOP VESSEL MAXI 1X406 RED (MISCELLANEOUS) ×6 IMPLANT
LOOP VESSEL MINI 0.8X406 BLUE (MISCELLANEOUS) ×4 IMPLANT
MANIFOLD NEPTUNE II (INSTRUMENTS) ×2 IMPLANT
NDL FILTER BLUNT 18X1 1/2 (NEEDLE) ×2 IMPLANT
NEEDLE FILTER BLUNT 18X1 1/2 (NEEDLE) ×2 IMPLANT
NS IRRIG 1000ML POUR BTL (IV SOLUTION) ×2 IMPLANT
PACK BASIN MAJOR ARMC (MISCELLANEOUS) ×2 IMPLANT
PACK UNIVERSAL (MISCELLANEOUS) ×2 IMPLANT
PAD PREP OB/GYN DISP 24X41 (PERSONAL CARE ITEMS) ×2 IMPLANT
PENCIL SMOKE EVACUATOR (MISCELLANEOUS) IMPLANT
SET WALTER ACTIVATION W/DRAPE (SET/KITS/TRAYS/PACK) ×2 IMPLANT
SPONGE T-LAP 18X18 ~~LOC~~+RFID (SPONGE) ×6 IMPLANT
STAPLER SKIN PROX 35W (STAPLE) ×2 IMPLANT
SUT ETHIBOND CT1 BRD #0 30IN (SUTURE) IMPLANT
SUT ETHILON 3-0 FS-10 30 BLK (SUTURE) ×4
SUT GTX CV-5 TTC13 DBL (SUTURE) IMPLANT
SUT GTX CV-6 30 (SUTURE) IMPLANT
SUT MNCRL+ 5-0 UNDYED PC-3 (SUTURE) ×2 IMPLANT
SUT PROLENE 5 0 RB 1 DA (SUTURE) IMPLANT
SUT PROLENE 6 0 BV (SUTURE) ×12 IMPLANT
SUT PROLENE 7 0 BV 1 (SUTURE) ×8 IMPLANT
SUT SILK 2 0 (SUTURE) ×2
SUT SILK 2 0 SH (SUTURE) ×2 IMPLANT
SUT SILK 2-0 18XBRD TIE 12 (SUTURE) ×2 IMPLANT
SUT SILK 3 0 (SUTURE) ×2
SUT SILK 3-0 18XBRD TIE 12 (SUTURE) ×2 IMPLANT
SUT SILK 4 0 (SUTURE) ×2
SUT SILK 4-0 18XBRD TIE 12 (SUTURE) ×2 IMPLANT
SUT VIC AB 2-0 CT1 (SUTURE) ×6 IMPLANT
SUT VIC AB 2-0 SH 27 (SUTURE)
SUT VIC AB 2-0 SH 27XBRD (SUTURE) IMPLANT
SUT VIC AB 3-0 SH 27 (SUTURE) ×2
SUT VIC AB 3-0 SH 27X BRD (SUTURE) ×2 IMPLANT
SUT VICRYL+ 3-0 36IN CT-1 (SUTURE) ×6 IMPLANT
SUTURE EHLN 3-0 FS-10 30 BLK (SUTURE) IMPLANT
SYR 20ML LL LF (SYRINGE) ×2 IMPLANT
SYR 3ML LL SCALE MARK (SYRINGE) ×2 IMPLANT
TAG SUTURE CLAMP YLW 5PR (MISCELLANEOUS) ×4
TAPE UMBILICAL 1/8 X36 TWILL (MISCELLANEOUS) ×2 IMPLANT
TOWEL OR 17X26 4PK STRL BLUE (TOWEL DISPOSABLE) ×2 IMPLANT
TRAP FLUID SMOKE EVACUATOR (MISCELLANEOUS) ×2 IMPLANT
TRAY FOLEY MTR SLVR 16FR STAT (SET/KITS/TRAYS/PACK) ×2 IMPLANT
VALVULOTOME HEAD CUTTING URSL (MISCELLANEOUS) IMPLANT
VALVULOTOME URESIL (MISCELLANEOUS)
WATER STERILE IRR 500ML POUR (IV SOLUTION) ×2 IMPLANT
WND VAC CAP TUBING TRAC (MISCELLANEOUS)

## 2023-04-29 NOTE — Op Note (Signed)
[facility]   OPERATIVE NOTE    PRE-OPERATIVE DIAGNOSIS: Atherosclerotic occlusive disease bilateral lower extremities with left lower extremity rest pain and ulceration and gangrene of the left fifth toe  POST-OPERATIVE DIAGNOSIS: Same  PROCEDURE: Left common femoral artery to below-the-knee popliteal artery bypass with 6 mm Distaflo graft Left popliteal and tibioperoneal trunk artery endarterectomy. Redo vascular surgery left femoral-popliteal after multiple previous procedures. Placement of a disposable VAC left groin  CO-SURGEONS: Latina Craver Alanni Vader and Annice Needy, MD  ASSISTANT(S): None  ANESTHESIA: General  ESTIMATED BLOOD LOSS: 550 cc  FINDING(S): Profoundly calcified arteries in the distal popliteal and proximal tibial distribution.  Similarly, an incredibly dense amount of scar tissue in her left groin secondary to her multiple previous operations  SPECIMEN(S): Plaque from the left popliteal artery and tibioperoneal trunk associated with acute thrombus  INDICATIONS:   Laura Reed is a 62 y.o. female who presents with left lower extremity rest pain in association with ulceration and gangrene of the left fifth toe. The risk, benefits, and alternative for bypass operations were discussed with the patient.  The patient is aware the risks include but are not limited to: bleeding, infection, myocardial infarction, stroke, limb loss, nerve damage, need for additional procedures in the future, wound complications, and inability to complete the bypass.  The patient is aware of these risks and agreed to proceed.  DESCRIPTION: After full informed written consent was obtained, the patient was brought back to the operating room and placed supine upon the operating table.    Co-surgeons are required because this is a complicated procedure with work being performed simultaneously by both surgeons.  This expedites the procedure making a shorter operative time  reducing complications and improving patient safety.  Prior to induction, the patient was given intravenous antibiotics.  After obtaining adequate anesthesia, the patient was prepped and draped in the standard fashion for a femoral to popliteal bypass operation.  Attention was turned to the left groin.  A longitudinal incision was made over the left common femoral artery through the incisional scar.  Using blunt dissection and electrocautery, the artery as well as the previous femorofemoral bypass and femoral-popliteal bypass was dissected out from the inguinal ligament down to the femoral bifurcation.  The scar tissue was exceedingly dense making the dissection extremely difficult.  The superficial femoral artery, profunda femoral artery, and external iliac artery were dissected out and vessel loops applied.  Circumflex branches were also dissected and controlled with vessel loops.  This common femoral artery was found on exam to be profoundly thickened and scarred.    A medial calf incision was made and carried down through the soft tissues exposing the fascia which was then opened and the gastroc muscles reflected posteriorly.  The neurovascular bundle was then identified and the distal popliteal dissected circumferentially.  The dissection was then carried down approximately 2 cm onto the tibioperoneal trunk which was looped with a Silastic vessel loop.  Approximately 1 cm of length of the anterior tibial artery was also dissected and looped with a Silastic vessel loop.  The Scanlan tunneler was then used to create a path from the popliteal to the groin incision and a 6 mm ringed Distaflo graft was pulled through the tunneler and the tunneler removed.  6000 units of heparin was given and allowed to circulate for several minutes.  The 11 blade was then utilized to create an arteriotomy in the distal popliteal.  This was extended with Potts scissors.  Due to the extensive dense calcified plaque present,  an endarterectomy was deemed to be necessary in order to create an area that was feasible to suture.  I extended the arteriotomy into the proximal tibioperoneal trunk.  At this point [facility] coronary dilator 2 mm in size was able to be passed through the tibioperoneal trunk.  The distal flow graft was then approximated to the arteriotomy using running CV 6 suture.  The leg was then straightened and approximated to the common femoral.  Using a Satinsky clamp vascular control was achieved without compromising the femorofemoral.  Arteriotomy was made with an 11 blade and then extended with Potts scissors and stay sutures of 6-0 Prolene were then performed.  The Distaflo graft graft was then beveled and approximated to the femoral head in an inside fashion using running CV 5 suture.  Flushing maneuvers were performed.  The graft was forward flushed and then the suture line was completed distally.  2 areas of the suture line distally were treated with pledgeted interrupted sutures which is achieved hemostasis.  At this point, all wound were washed out.  Bleeding points were controlled with electrocautery, and suture repair of active bleeding points in the suture line.  No further bleeding was present.  Surgicel and Vistaseal topical hemostatic agents were placed and hemostasis was complete.  Additionally, vancomycin mixed with gentamicin beads were made on the back table and then delivered into both the groin and the medial calf wound  The below-the-knee exposure was repaired with a layer of 3-0 Vicryl, and staples in the skin layer.  In a similar fashion, the groin was repaired with a double layer of 2-0 Vicryl, a double layer of 3-0 Vicryl, and interrupted nylon sutures for the skin layer.  All skin incisions were cleaned, dried, and a Prevena disposable VAC was applied to the groin incision honeycomb dressing was applied to the calf incision.   COMPLICATIONS: none  CONDITION: stable   [name] 04/29/2023 5:45 PM   This note was created with Dragon Medical transcription system. Any errors in dictation are purely unintentional.

## 2023-04-29 NOTE — Transfer of Care (Signed)
Immediate Anesthesia Transfer of Care Note  Patient: Laura Reed  Procedure(s) Performed: BYPASS GRAFT FEMORAL-POPLITEAL ARTERY (Left: Leg Upper) APPLICATION OF CELL SAVER (Left)  Patient Location: PACU  Anesthesia Type:General  Level of Consciousness: awake  Airway & Oxygen Therapy: Patient Spontanous Breathing and Patient connected to face mask oxygen  Post-op Assessment: Report given to RN and Post -op Vital signs reviewed and stable  Post vital signs: Reviewed and stable  Last Vitals:  Vitals Value Taken Time  BP 109/51 04/29/23 1730  Temp 36.7 C 04/29/23 1730  Pulse 42 04/29/23 1740  Resp 13 04/29/23 1740  SpO2 100 % 04/29/23 1740  Vitals shown include unfiled device data.  Last Pain:  Vitals:   04/29/23 1203  TempSrc: Temporal  PainSc: 0-No pain      Patients Stated Pain Goal: 0 (04/28/23 1940)  Complications: No notable events documented.

## 2023-04-29 NOTE — Anesthesia Procedure Notes (Signed)
Procedure Name: Intubation Date/Time: 04/29/2023 1:32 PM  Performed by: Monico Hoar, CRNAPre-anesthesia Checklist: Patient identified, Patient being monitored, Timeout performed, Emergency Drugs available and Suction available Patient Re-evaluated:Patient Re-evaluated prior to induction Oxygen Delivery Method: Circle system utilized Preoxygenation: Pre-oxygenation with 100% oxygen Induction Type: IV induction Ventilation: Mask ventilation without difficulty Laryngoscope Size: McGraph and 4 Grade View: Grade I Tube type: Oral Tube size: 7.0 mm Number of attempts: 1 Airway Equipment and Method: Stylet Placement Confirmation: ETT inserted through vocal cords under direct vision, positive ETCO2 and breath sounds checked- equal and bilateral Secured at: 21 cm Tube secured with: Tape Dental Injury: Teeth and Oropharynx as per pre-operative assessment

## 2023-04-29 NOTE — Op Note (Signed)
[facility]    OPERATIVE NOTE   PROCEDURE: left common femoral artery to tibioperoneal trunk artery bypass with 6 mm Distaflo PTFE graft Left popliteal and tibioperoneal trunk artery Endarterectomy Redo surgery for exposure of the left femoral artery after multiple previous surgeries Incisional (disposable) VAC placement left groin  PRE-OPERATIVE DIAGNOSIS: Atherosclerotic Occlusive Disease with gangrene  POST-OPERATIVE DIAGNOSIS:  Atherosclerotic Occlusive Disease with gangrene  SURGEONS: Renford Dills, M.D. and [name], MD  ASSISTANT(S): none  ANESTHESIA: general  ESTIMATED BLOOD LOSS: 550 cc  FINDING(S): None   SPECIMEN(S): Left popliteal artery and tibioperoneal trunk thrombus and plaque  INDICATIONS:   Laura Reed is a 62 y.o. female who presents with gangrenous changes to the left foot and severe peripheral arterial disease. The risk, benefits, and alternative for bypass operations were discussed with the patient.  The patient is aware the risks include but are not limited to: bleeding, infection, myocardial infarction, stroke, limb loss, nerve damage, need for additional procedures in the future, wound complications, and inability to complete the bypass.  The patient is voices understanding of these risks and agreed to proceed.  DESCRIPTION: After informed consent was obtained, the patient was brought back to the operating room and placed in the supine position.  Prior to induction, the patient was given intravenous antibiotics.  After general anesthesia is induced, the patient was prepped and draped in the standard fashion for a femoral to popliteal bypass operation.  Appropriate timeout is called.  Co-surgeons are required because this is a complex multilevel procedure with work being performed simultaneously from both the patient's right and left sides.  This also expedites the procedure making a shorter operative time reducing complications and  improving patient safety.   Attention was turned to the left groin.  A longitudinal incision was made over the left common femoral artery.  Using blunt dissection and electrocautery, the artery was dissected circumferentially from the inguinal ligament down to the femoral bifurcation.  The superficial femoral artery, profunda femoral artery, and femoral to femoral bypass as well as the common femoral artery proximal and the old femoral to popliteal bypass which had a short stump patent on angiography are looped with Silastic vessel loops.  This dissection was extremely tedious due to the multiple previous surgical therapies in the left groin including a previous femoral to popliteal bypass and previous femoral to femoral bypass.  There was extensive, dense scar tissue making the dissection much harder than typical.   Medial incision was then made in the calf and the distal popliteal artery, tibioperoneal trunk, and the proximal portion of the anterior tibial artery was identified.  These vessels were dissected circumferentially and looped with Silastic vessel loops. A tunneler was then passed and a Bard distal flow graft was then pulled through the tunnel. The distal end was approximated to the tibial artery.  The patient was given 7000 units of Heparin intravenously, and this is allowed to circulate for proximally 5 minutes.  Attention was then turned to the tibial target site. Arteriotomy was made with a 11-blade and extended with Potts scissors.  The distal end of the conduit was premade and we made arteriotomy to match the length of the conduit.  On opening the tibioperoneal trunk and distal popliteal artery, it had thrombus as well as severe calcific disease.  An endarterectomy was performed up into the distal popliteal artery and down into the tibioperoneal trunk below the origin of the anterior tibial artery.  This was done  with a Therapist, nutritional.  Thrombus was removed as well.  2 Garret coronary  dilators were passed distally down into the peroneal artery with return of backbleeding.  An anastomosis was then created with the Distaflo graft to the tibioperoneal trunk with a CV 6 suture in the usual fashion.  An interrupted suture was placed and the small gap was left to forward flush after we do the proximal anastomosis.    The proximal extent of the bypass graft conduit was trimmed to the appropriate shape.  The PTFE graft was sewn to the common femoral artery/hood of the previous femoral to popliteal bypass in an end-to-side configuration with a running stitch of Gore CV 5 suture.  Prior to completing the suture line the anastomosis is flushed and subsequently the suture line is completed. Flow was then reestablished to the profunda femoris artery.  The anastomosis is checked for leaks and the Distaflo graft is clamped proximally.  Subsequently, gauze was placed around the suture line. Attention was then turned to the distal end of the graft.  I released the clamp and pulsatile bleeding from the graft was evident.  I reclamped the graft near the arterial anastomosis.  The conduit was irrigated with heparinized saline.    Attention was then turned back to the distal anastomosis.  An additional CV 6 suture was used to complete the anastomosis.  Prior to completing the anastomosis, flushing maneuvers were performed and then flow was established distally.  After inspecting the anastomosis for hemostasis, gauze was placed around the suture line.   The wounds were then irrigated with sterile saline.  The wounds were then inspected for hemostasis. Bleeding points were controlled with electrocautery, and suture repair of active bleeding points.  Fibrillar and Vistacel were then placed in the bed of the wounds. Once hemostasis was achieved.  The groin incision was then closed in multiple layers using both 2-0 and 3-0 Vicryl in interrupted and running fashion. Skin was closed with a interrupted mattress 3-0  Nylon sutures.  An incisional (disposable) VAC was then placed over the groin incision.  The suction sponge was connected to adhesive dressing and a good seal was obtained once connected to suction.. The medial calf and medial thigh wound were then closed layers using  3-0 Vicryl, and staples.   A honeycomb dressing was placed over the groin incision.   COMPLICATIONS: None  CONDITION: Stable  [name], M.D. [place] [facility] Office: [phone]  04/29/2023, 5:19 PM

## 2023-04-29 NOTE — Interval H&P Note (Signed)
History and Physical Interval Note:  04/29/2023 12:48 PM  Laura Reed  has presented today for surgery, with the diagnosis of Ischemic left leg.  The various methods of treatment have been discussed with the patient and family. After consideration of risks, benefits and other options for treatment, the patient has consented to  Procedure(s): BYPASS GRAFT FEMORAL-POPLITEAL ARTERY (Left) APPLICATION OF CELL SAVER (Left) as a surgical intervention.  The patient's history has been reviewed, patient examined, no change in status, stable for surgery.  I have reviewed the patient's chart and labs.  Questions were answered to the patient's satisfaction.     [name]

## 2023-04-29 NOTE — Anesthesia Preprocedure Evaluation (Addendum)
Anesthesia Evaluation  Patient identified by MRN, date of birth, ID band Patient awake    Reviewed: Allergy & Precautions, NPO status , Patient's Chart, lab work & pertinent test results  History of Anesthesia Complications Negative for: history of anesthetic complications  Airway Mallampati: III  TM Distance: >3 FB Neck ROM: full    Dental  (+) Upper Dentures, Lower Dentures   Pulmonary former smoker   Pulmonary exam normal        Cardiovascular hypertension, On Medications + CAD, + CABG, + Peripheral Vascular Disease and +CHF  Normal cardiovascular exam     Neuro/Psych  PSYCHIATRIC DISORDERS Anxiety      Neuromuscular disease    GI/Hepatic Neg liver ROS,GERD  Controlled,,  Endo/Other  diabetes, Insulin Dependent    Renal/GU Renal disease     Musculoskeletal  (+) Arthritis ,    Abdominal   Peds  Hematology negative hematology ROS (+)   Anesthesia Other Findings Past Medical History: No date: Absence of kidney     Comment:  left No date: Anxiety No date: Arthritis No date: Bladder cancer (HCC) No date: CHF (congestive heart failure) (HCC) No date: Complication of anesthesia     Comment:  BP HAS  RUN LOW AFTER SURGERY-LUNGS FILLED UP WITH FLUID              AFTER  LEG STENT SURGERY  No date: Coronary artery disease No date: Diabetes mellitus No date: Family history of adverse reaction to anesthesia     Comment:  Sister - PONV No date: GERD (gastroesophageal reflux disease)     Comment:  OCC TUMS No date: Heart murmur No date: Hemorrhoid 2007: History of methicillin resistant staphylococcus aureus (MRSA) No date: Hypertension No date: Neuropathy No date: PVD (peripheral vascular disease) (HCC) No date: Thyroid nodule     Comment:  right 10/31/2014: Urothelial carcinoma of kidney (HCC)     Comment:  INVASIVE UROTHELIAL CARCINOMA, LOW GRADE. T1, Nx. No date: Wears dentures     Comment:  full upper and  lower  Past Surgical History: No date: AMPUTATION TOE     Comment:  right (4th and 5th); left (great toe, 3rd) 07/16/2018: AMPUTATION TOE; Right     Comment:  Procedure: AMPUTATION TOE/MPJ right 2nd;  Surgeon:               Linus Galas, DPM;  Location: [facility];  Service:               Podiatry;  Laterality: Right; 2009, 2013 x 2: ARTERIAL BYPASS SURGRY     Comment:  right leg , done in [place] No date: CARDIAC CATHETERIZATION 01/2014: CAROTID ENDARTERECTOMY; Right     Comment:  Dr Gilda Crease 12/14/2014: CATARACT EXTRACTION W/PHACO; Right     Comment:  Procedure: CATARACT EXTRACTION PHACO AND INTRAOCULAR               LENS PLACEMENT (IOC);  Surgeon: Lia Hopping, MD;                Location: [facility];  Service: Ophthalmology;  Laterality:              Right;  [place]   00:38.6              AP        7.1                        CDE  2.76 12/06/2019: CATARACT EXTRACTION W/PHACO;  Left     Comment:  Procedure: CATARACT EXTRACTION PHACO AND INTRAOCULAR               LENS PLACEMENT (IOC) LEFT DIABETIC;  Surgeon: Galen Manila, MD;  Location: [facility];  Service:               Ophthalmology;  Laterality: Left;  9.08 1:06.4 No date: CESAREAN SECTION 03-03-12: CHOLECYSTECTOMY     Comment:  Porcelain gallbladder, gallstones,  [place] 04/28/2012: COLONOSCOPY W/ BIOPSIES     Comment:  Hyperplastic rectal polyps. 04/02/2022: COLONOSCOPY WITH PROPOFOL; N/A     Comment:  Procedure: COLONOSCOPY WITH PROPOFOL;  Surgeon: Earline Mayotte, MD;  Location: [facility];  Service:               Endoscopy;  Laterality: N/A; 2009: CORONARY ARTERY BYPASS GRAFT     Comment:  3 vessel 09/01/2016: CYSTOSCOPY W/ RETROGRADES; Right     Comment:  Procedure: CYSTOSCOPY WITH RETROGRADE PYELOGRAM;                Surgeon: Vanna Scotland, MD;  Location: [facility];                Service: Urology;  Laterality: Right; 03/19/2020: CYSTOSCOPY W/ RETROGRADES; Bilateral     Comment:   Procedure: CYSTOSCOPY WITH RETROGRADE PYELOGRAM;                Surgeon: Vanna Scotland, MD;  Location: [facility];                Service: Urology;  Laterality: Bilateral; 03/19/2020: CYSTOSCOPY WITH BIOPSY; N/A     Comment:  Procedure: CYSTOSCOPY WITH BIOPSY;  Surgeon: Vanna Scotland, MD;  Location: [facility];  Service: Urology;                Laterality: N/A; No date: EYE SURGERY 10-31-14: HERNIA REPAIR     Comment:  ventral, retro-rectus atrium mesh 01/18/2019: IRRIGATION AND DEBRIDEMENT FOOT; Left     Comment:  Procedure: IRRIGATION AND DEBRIDEMENT FOOT;  Surgeon:               Linus Galas, DPM;  Location: [facility];  Service:               Podiatry;  Laterality: Left; 12/10/2016: LOWER EXTREMITY ANGIOGRAPHY; Left     Comment:  Procedure: Lower Extremity Angiography;  Surgeon:               Renford Dills, MD;  Location: [facility];               Service: Cardiovascular;  Laterality: Left; 02/02/2018: LOWER EXTREMITY ANGIOGRAPHY; Left     Comment:  Procedure: LOWER EXTREMITY ANGIOGRAPHY;  Surgeon:               Renford Dills, MD;  Location: [facility];               Service: Cardiovascular;  Laterality: Left; 05/05/2018: LOWER EXTREMITY ANGIOGRAPHY; Left     Comment:  Procedure: LOWER EXTREMITY ANGIOGRAPHY;  Surgeon:               Renford Dills, MD;  Location: [facility];  Service: Cardiovascular;  Laterality: Left; 12/04/2020: LOWER EXTREMITY ANGIOGRAPHY; Left     Comment:  Procedure: LOWER EXTREMITY ANGIOGRAPHY with               Intervention;  Surgeon: Renford Dills, MD;                Location: [facility];  Service: Cardiovascular;              Laterality: Left; 04/24/2023: LOWER EXTREMITY ANGIOGRAPHY; Left     Comment:  Procedure: Lower Extremity Angiography;  Surgeon:               Renford Dills, MD;  Location: [facility];               Service: Cardiovascular;  Laterality:  Left; 10-31-14: NEPHRECTOMY; Left 05/01/2015: PERIPHERAL VASCULAR CATHETERIZATION; Left     Comment:  Procedure: Lower Extremity Angiography;  Surgeon:               Renford Dills, MD;  Location: [facility];                Service: Cardiovascular;  Laterality: Left; 05/01/2015: PERIPHERAL VASCULAR CATHETERIZATION     Comment:  Procedure: Lower Extremity Intervention;  Surgeon:               Renford Dills, MD;  Location: [facility];                Service: Cardiovascular;; 02/20/2015: PERIPHERAL VASCULAR CATHETERIZATION; Left     Comment:  Procedure: Pelvic Angiography;  Surgeon: Renford Dills, MD;  Location: [facility];  Service:               Cardiovascular;  Laterality: Left; 09/01/2016: TRANSURETHRAL RESECTION OF BLADDER TUMOR WITH MITOMYCIN-C;  N/A     Comment:  Procedure: TRANSURETHRAL RESECTION OF BLADDER TUMOR WITH              MITOMYCIN-C;  Surgeon: Vanna Scotland, MD;  Location:               [facility];  Service: Urology;  Laterality: N/A;  BMI    Body Mass Index: 21.13 kg/m      Reproductive/Obstetrics negative OB ROS                              Anesthesia Physical Anesthesia Plan  ASA: 3  Anesthesia Plan: General ETT   Post-op Pain Management: Ofirmev IV (intra-op)*   Induction: Intravenous  PONV Risk Score and Plan: 3 and Ondansetron, Dexamethasone, Midazolam and Treatment may vary due to age or medical condition  Airway Management Planned: Oral ETT  Additional Equipment:   Intra-op Plan:   Post-operative Plan: Extubation in OR  Informed Consent: I have reviewed the patients History and Physical, chart, labs and discussed the procedure including the risks, benefits and alternatives for the proposed anesthesia with the patient or authorized representative who has indicated his/her understanding and acceptance.     Dental Advisory Given  Plan Discussed with: Anesthesiologist,  CRNA and Surgeon  Anesthesia Plan Comments: (Patient consented for risks of anesthesia including but not limited to:  - adverse reactions to medications - damage to eyes, teeth, lips or other oral mucosa - nerve damage due to positioning  - sore throat or hoarseness -  Damage to heart, brain, nerves, lungs, other parts of body or loss of life  Patient voiced understanding and assent.)         Anesthesia Quick Evaluation

## 2023-04-29 NOTE — Interval H&P Note (Signed)
History and Physical Interval Note:  04/29/2023 12:47 PM  Laura Reed  has presented today for surgery, with the diagnosis of Ischemic left leg.  The various methods of treatment have been discussed with the patient and family. After consideration of risks, benefits and other options for treatment, the patient has consented to  Procedure(s): BYPASS GRAFT FEMORAL-POPLITEAL ARTERY (Left) APPLICATION OF CELL SAVER (Left) as a surgical intervention.  The patient's history has been reviewed, patient examined, no change in status, stable for surgery.  I have reviewed the patient's chart and labs.  Questions were answered to the patient's satisfaction.     [name]

## 2023-04-29 NOTE — Progress Notes (Signed)
PHARMACY - ANTICOAGULATION CONSULT NOTE  Pharmacy Consult for Heparin Infusion Indication:  Post lower extremity thrombectomy  No Known Allergies  Patient Measurements: Height: 5\' 7"  (170.2 cm) Weight: 61.2 kg (134 lb 14.7 oz) IBW/kg (Calculated) : 61.6 Heparin Dosing Weight: 61.2 kg  Vital Signs: Temp: 98 F (36.7 C) (10/23 1730) Temp Source: Temporal (10/23 1203) BP: 109/51 (10/23 1730) Pulse Rate: 41 (10/23 1813)  Labs: Recent Labs    04/27/23 0456 04/28/23 0634 04/29/23 0415  HGB 13.7 14.2 13.7  HCT 37.9 40.1 39.1  PLT 201 204 226  HEPARINUNFRC 0.59 0.49 0.36    Estimated Creatinine Clearance: 68.6 mL/min (by C-G formula based on SCr of 0.78 mg/dL).   Medical History: Past Medical History:  Diagnosis Date   Absence of kidney    left   Anxiety    Arthritis    Bladder cancer (HCC)    CHF (congestive heart failure) (HCC)    Complication of anesthesia    BP HAS  RUN LOW AFTER SURGERY-LUNGS FILLED UP WITH FLUID AFTER  LEG STENT SURGERY    Coronary artery disease    Diabetes mellitus    Family history of adverse reaction to anesthesia    Sister - PONV   GERD (gastroesophageal reflux disease)    OCC TUMS   Heart murmur    Hemorrhoid    History of methicillin resistant staphylococcus aureus (MRSA) 2007   Hypertension    Neuropathy    PVD (peripheral vascular disease) (HCC)    Thyroid nodule    right   Urothelial carcinoma of kidney (HCC) 10/31/2014   INVASIVE UROTHELIAL CARCINOMA, LOW GRADE. T1, Nx.   Wears dentures    full upper and lower    Assessment: Patient is a 62yo female with left lower extremity ischemia, she is s/p mechanical thrombectomy. Pharmacy consulted for heparin Infusion.   10/23:  HL @ 0415 = 0.36, therapeutic X 5 10/23 for vascular procedure 10/23 1930 to restart heparin drip at 600 units/hr. Do not adjust drip unless >0.35-  see instructions in consult per vascular  Goal of Therapy:  Heparin level 0.3-0.7 units/ml Monitor  platelets by anticoagulation protocol: Yes   Plan:  10/23 1930 to restart heparin drip at 600 units/hr. Do not adjust drip unless >0.35-  see instructions in consult per vascular.: if >0.35 decrease by 100 u/h and recheck heparin level in 6 hours.  ---check heparin level in 6 hrs from drip start and CBC with AM labs.   Bari Mantis PharmD Clinical Pharmacist 04/29/2023

## 2023-04-29 NOTE — Progress Notes (Signed)
Upon arrival to pacu, patient awake/alert x4.  Pulses with doppler, OR RN Daniel at bedside, pulses present with doppler. Heparin gtt to restart at 1930 @ 600u/hr.

## 2023-04-29 NOTE — Progress Notes (Signed)
PHARMACY - ANTICOAGULATION CONSULT NOTE  Pharmacy Consult for Heparin Infusion Indication:  Post lower extremity thrombectomy  No Known Allergies  Patient Measurements: Height: 5\' 7"  (170.2 cm) Weight: 61.2 kg (134 lb 14.7 oz) IBW/kg (Calculated) : 61.6 Heparin Dosing Weight: 61.2 kg  Vital Signs: Temp: 98 F (36.7 C) (10/23 0422) Temp Source: Oral (10/22 2034) BP: 133/70 (10/23 0422) Pulse Rate: 64 (10/23 0422)  Labs: Recent Labs    04/27/23 0456 04/28/23 0634 04/29/23 0415  HGB 13.7 14.2 13.7  HCT 37.9 40.1 39.1  PLT 201 204 226  HEPARINUNFRC 0.59 0.49 0.36    Estimated Creatinine Clearance: 68.6 mL/min (by C-G formula based on SCr of 0.78 mg/dL).   Medical History: Past Medical History:  Diagnosis Date   Absence of kidney    left   Anxiety    Arthritis    Bladder cancer (HCC)    CHF (congestive heart failure) (HCC)    Complication of anesthesia    BP HAS  RUN LOW AFTER SURGERY-LUNGS FILLED UP WITH FLUID AFTER  LEG STENT SURGERY    Coronary artery disease    Diabetes mellitus    Family history of adverse reaction to anesthesia    Sister - PONV   GERD (gastroesophageal reflux disease)    OCC TUMS   Heart murmur    Hemorrhoid    History of methicillin resistant staphylococcus aureus (MRSA) 2007   Hypertension    Neuropathy    PVD (peripheral vascular disease) (HCC)    Thyroid nodule    right   Urothelial carcinoma of kidney (HCC) 10/31/2014   INVASIVE UROTHELIAL CARCINOMA, LOW GRADE. T1, Nx.   Wears dentures    full upper and lower    Assessment: Patient is a 62yo female with left lower extremity ischemia, she is s/p mechanical thrombectomy. Pharmacy consulted for heparin Infusion.   Goal of Therapy:  Heparin level 0.3-0.7 units/ml Monitor platelets by anticoagulation protocol: Yes   Plan:  10/23:  HL @ 0415 = 0.36, therapeutic X 5 ---Recheck heparin level and CBC with AM labs.   Kshawn Canal D 04/29/2023 5:13 AM

## 2023-04-30 ENCOUNTER — Encounter: Payer: Self-pay | Admitting: Urology

## 2023-04-30 ENCOUNTER — Encounter: Payer: Self-pay | Admitting: Vascular Surgery

## 2023-04-30 ENCOUNTER — Ambulatory Visit: Payer: 59 | Admitting: Internal Medicine

## 2023-04-30 LAB — BASIC METABOLIC PANEL
Anion gap: 7 (ref 5–15)
BUN: 19 mg/dL (ref 8–23)
CO2: 21 mmol/L — ABNORMAL LOW (ref 22–32)
Calcium: 7.6 mg/dL — ABNORMAL LOW (ref 8.9–10.3)
Chloride: 108 mmol/L (ref 98–111)
Creatinine, Ser: 0.71 mg/dL (ref 0.44–1.00)
GFR, Estimated: >60 mL/min (ref >60)
Glucose, Bld: 204 mg/dL — ABNORMAL HIGH (ref 70–99)
Potassium: 4.2 mmol/L (ref 3.5–5.1)
Sodium: 136 mmol/L (ref 135–145)

## 2023-04-30 LAB — CBC
HCT: 35.8 % — ABNORMAL LOW (ref 36.0–46.0)
HCT: 33.3 % — ABNORMAL LOW (ref 36.0–46.0)
Hemoglobin: 12.1 g/dL (ref 12.0–15.0)
Hemoglobin: 11.7 g/dL — ABNORMAL LOW (ref 12.0–15.0)
MCH: 30 pg (ref 26.0–34.0)
MCH: 30.5 pg (ref 26.0–34.0)
MCHC: 33.8 g/dL (ref 30.0–36.0)
MCHC: 35.1 g/dL (ref 30.0–36.0)
MCV: 88.6 fL (ref 80.0–100.0)
MCV: 86.9 fL (ref 80.0–100.0)
Platelets: 194 10*3/uL (ref 150–400)
Platelets: 203 10*3/uL (ref 150–400)
RBC: 4.04 MIL/uL (ref 3.87–5.11)
RBC: 3.83 MIL/uL — ABNORMAL LOW (ref 3.87–5.11)
RDW: 13.6 % (ref 11.5–15.5)
RDW: 13.8 % (ref 11.5–15.5)
WBC: 16.3 10*3/uL — ABNORMAL HIGH (ref 4.0–10.5)
WBC: 15.4 10*3/uL — ABNORMAL HIGH (ref 4.0–10.5)
nRBC: 0 % (ref 0.0–0.2)
nRBC: 0 % (ref 0.0–0.2)

## 2023-04-30 LAB — HEPARIN LEVEL (UNFRACTIONATED)
Heparin Unfractionated: <0.1 [IU]/mL — ABNORMAL LOW (ref 0.30–0.70)
Heparin Unfractionated: <0.1 [IU]/mL — ABNORMAL LOW (ref 0.30–0.70)

## 2023-04-30 LAB — SURGICAL PATHOLOGY

## 2023-04-30 LAB — GLUCOSE, CAPILLARY
Glucose-Capillary: 195 mg/dL — ABNORMAL HIGH (ref 70–99)
Glucose-Capillary: 248 mg/dL — ABNORMAL HIGH (ref 70–99)
Glucose-Capillary: 288 mg/dL — ABNORMAL HIGH (ref 70–99)
Glucose-Capillary: 235 mg/dL — ABNORMAL HIGH (ref 70–99)

## 2023-04-30 MED ORDER — CEFAZOLIN SODIUM-DEXTROSE 2-4 GM/100ML-% IV SOLN
2.0000 g | INTRAVENOUS | Status: AC
  Administered 2023-05-01: 2 g via INTRAVENOUS

## 2023-04-30 NOTE — Anesthesia Postprocedure Evaluation (Signed)
Anesthesia Post Note  Patient: Laura Reed  Procedure(s) Performed: BYPASS GRAFT FEMORAL-POPLITEAL ARTERY (Left: Leg Upper) APPLICATION OF CELL SAVER (Left)  Patient location during evaluation: ICU Anesthesia Type: General Level of consciousness: awake and alert Pain management: pain level controlled Vital Signs Assessment: post-procedure vital signs reviewed and stable Respiratory status: spontaneous breathing and respiratory function stable Cardiovascular status: stable Postop Assessment: no apparent nausea or vomiting Anesthetic complications: no  No notable events documented.   Last Vitals:  Vitals:   04/30/23 0746 04/30/23 0800  BP:  138/87  Pulse:  72  Resp:  15  Temp: 37.2 C   SpO2:  98%    Last Pain:  Vitals:   04/30/23 0830  TempSrc:   PainSc: 5                  Sonny Anthes,  Alessandra Bevels

## 2023-04-30 NOTE — Plan of Care (Signed)
  Problem: Education: Goal: Knowledge of General Education information will improve Description: Including pain rating scale, medication(s)/side effects and non-pharmacologic comfort measures Outcome: Progressing   Problem: Health Behavior/Discharge Planning: Goal: Ability to manage health-related needs will improve Outcome: Progressing   Problem: Clinical Measurements: Goal: Ability to maintain clinical measurements within normal limits will improve Outcome: Progressing Goal: Will remain free from infection Outcome: Progressing Goal: Diagnostic test results will improve Outcome: Progressing Goal: Respiratory complications will improve Outcome: Progressing Goal: Cardiovascular complication will be avoided Outcome: Progressing   Problem: Activity: Goal: Risk for activity intolerance will decrease Outcome: Progressing   Problem: Nutrition: Goal: Adequate nutrition will be maintained Outcome: Progressing   Problem: Coping: Goal: Level of anxiety will decrease Outcome: Progressing   Problem: Elimination: Goal: Will not experience complications related to bowel motility Outcome: Progressing Goal: Will not experience complications related to urinary retention Outcome: Progressing   Problem: Pain Managment: Goal: General experience of comfort will improve Outcome: Progressing   Problem: Safety: Goal: Ability to remain free from injury will improve Outcome: Progressing   Problem: Skin Integrity: Goal: Risk for impaired skin integrity will decrease Outcome: Progressing   Problem: Education: Goal: Ability to describe self-care measures that may prevent or decrease complications (Diabetes Survival Skills Education) will improve Outcome: Progressing Goal: Individualized Educational Video(s) Outcome: Progressing   Problem: Coping: Goal: Ability to adjust to condition or change in health will improve Outcome: Progressing   Problem: Fluid Volume: Goal: Ability to  maintain a balanced intake and output will improve Outcome: Progressing   Problem: Health Behavior/Discharge Planning: Goal: Ability to identify and utilize available resources and services will improve Outcome: Progressing Goal: Ability to manage health-related needs will improve Outcome: Progressing   Problem: Metabolic: Goal: Ability to maintain appropriate glucose levels will improve Outcome: Progressing   Problem: Nutritional: Goal: Maintenance of adequate nutrition will improve Outcome: Progressing Goal: Progress toward achieving an optimal weight will improve Outcome: Progressing   Problem: Skin Integrity: Goal: Risk for impaired skin integrity will decrease Outcome: Progressing   Problem: Tissue Perfusion: Goal: Adequacy of tissue perfusion will improve Outcome: Progressing   Problem: Education: Goal: Knowledge of prescribed regimen will improve Outcome: Progressing   Problem: Activity: Goal: Ability to tolerate increased activity will improve Outcome: Progressing   Problem: Bowel/Gastric: Goal: Gastrointestinal status for postoperative course will improve Outcome: Progressing   Problem: Clinical Measurements: Goal: Postoperative complications will be avoided or minimized Outcome: Progressing Goal: Signs and symptoms of graft occlusion will improve Outcome: Progressing   Problem: Skin Integrity: Goal: Demonstration of wound healing without infection will improve Outcome: Progressing   Problem: Education: Goal: Understanding of CV disease, CV risk reduction, and recovery process will improve Outcome: Progressing Goal: Individualized Educational Video(s) Outcome: Progressing   Problem: Activity: Goal: Ability to return to baseline activity level will improve Outcome: Progressing   Problem: Cardiovascular: Goal: Ability to achieve and maintain adequate cardiovascular perfusion will improve Outcome: Progressing Goal: Vascular access site(s) Level 0-1  will be maintained Outcome: Progressing   Problem: Health Behavior/Discharge Planning: Goal: Ability to safely manage health-related needs after discharge will improve Outcome: Progressing

## 2023-04-30 NOTE — Progress Notes (Signed)
PHARMACY - ANTICOAGULATION CONSULT NOTE  Pharmacy Consult for Heparin Infusion Indication:  Post lower extremity thrombectomy  No Known Allergies  Patient Measurements: Height: 5\' 7"  (170.2 cm) Weight: 61.2 kg (134 lb 14.7 oz) IBW/kg (Calculated) : 61.6 Heparin Dosing Weight: 61.2 kg  Vital Signs: Temp: 98.5 F (36.9 C) (10/24 0000) Temp Source: Oral (10/24 0000) BP: 129/42 (10/24 0130) Pulse Rate: 72 (10/24 0030)  Labs: Recent Labs    04/28/23 0634 04/29/23 0415 04/30/23 0114  HGB 14.2 13.7 12.1  HCT 40.1 39.1 35.8*  PLT 204 226 194  HEPARINUNFRC 0.49 0.36 <0.10*  CREATININE  --   --  0.71    Estimated Creatinine Clearance: 68.6 mL/min (by C-G formula based on SCr of 0.71 mg/dL).   Medical History: Past Medical History:  Diagnosis Date   Absence of kidney    left   Anxiety    Arthritis    Bladder cancer (HCC)    CHF (congestive heart failure) (HCC)    Complication of anesthesia    BP HAS  RUN LOW AFTER SURGERY-LUNGS FILLED UP WITH FLUID AFTER  LEG STENT SURGERY    Coronary artery disease    Diabetes mellitus    Family history of adverse reaction to anesthesia    Sister - PONV   GERD (gastroesophageal reflux disease)    OCC TUMS   Heart murmur    Hemorrhoid    History of methicillin resistant staphylococcus aureus (MRSA) 2007   Hypertension    Neuropathy    PVD (peripheral vascular disease) (HCC)    Thyroid nodule    right   Urothelial carcinoma of kidney (HCC) 10/31/2014   INVASIVE UROTHELIAL CARCINOMA, LOW GRADE. T1, Nx.   Wears dentures    full upper and lower    Assessment: Patient is a 62yo female with left lower extremity ischemia, she is s/p mechanical thrombectomy. Pharmacy consulted for heparin Infusion.   10/23:  HL @ 0415 = 0.36, therapeutic X 5 10/23 for vascular procedure 10/23 1930 to restart heparin drip at 600 units/hr. Do not adjust drip unless >0.35-  see instructions in consult per vascular 10/24:  HL @ 0114 = < 0.1  Goal  of Therapy:  Heparin level 0.3-0.7 units/ml Monitor platelets by anticoagulation protocol: Yes   Plan:  10/23 1930 to restart heparin drip at 600 units/hr. Do not adjust drip unless >0.35-  see instructions in consult per vascular.: if >0.35 decrease by 100 u/h and recheck heparin level in 6 hours.   10/24:  HL @ 0114 = < 0.1 -  will continue pt on current rate and recheck HL in 6 hrs per vascular instructions  Zackry Deines D Clinical Pharmacist 04/30/2023

## 2023-04-30 NOTE — Evaluation (Signed)
Occupational Therapy Evaluation Patient Details Name: Laura Reed MRN: [id number] DOB: 1960-12-24 Today's Date: 04/30/2023   History of Present Illness 62 y/o female admitted s/p mechanical thrombectomy of L common femoral and profunda femoris and L femoral-popliteal bypass with stent placement on 10/18. S/p bypass graft femoral-popliteal artery on 10/23. PMH: HTN, CHF, CAD, PVD   Clinical Impression   Pt was seen for OT evaluation this date with cotx with PT for safety/line mgt during ADL mobility. Prior to hospital admission, pt was independent, living by herself and her 4 cats in a condo with 2 STE and bilat rails. Pt notes she recently had her bathroom renovated to include a walk in shower. Pt denies falls in past 30mo however endorses fear of falling. Pt presents to acute OT demonstrating impaired ADL performance and functional mobility 2/2 decreased balance, pain at incision sites and gangrenous L toe, and decreased knowledge of AE/DME (See OT problem list for additional functional deficits). Pt currently requires supv-CGA for bed mobility, ADL transfers with 2WW (+ VC for hand placement), donning socks, and toilet transfer to [facility]. Pt educated in AE for LB ADL, positioning to support hip extension to neutral while groin incision heals, and falls prevention strategies wiht RW. Pt verbalized understanding. Pt would benefit from skilled OT services to address noted impairments and functional limitations (see below for any additional details) in order to maximize safety and independence while minimizing falls risk and caregiver burden.     If plan is discharge home, recommend the following: A little help with walking and/or transfers;A little help with bathing/dressing/bathroom;Assistance with cooking/housework;Assist for transportation;Help with stairs or ramp for entrance    Functional Status Assessment  Patient has had a recent decline in their functional status and demonstrates the ability  to make significant improvements in function in a reasonable and predictable amount of time.  Equipment Recommendations  Tub/shower seat;Other (comment) (2WW)    Recommendations for Other Services       Precautions / Restrictions Precautions Precautions: Fall Restrictions Weight Bearing Restrictions: No      Mobility Bed Mobility Overal bed mobility: Needs Assistance Bed Mobility: Supine to Sit, Sit to Supine     Supine to sit: Supervision          Transfers Overall transfer level: Needs assistance Equipment used: Rolling walker (2 wheels) Transfers: Sit to/from Stand Sit to Stand: Contact guard assist           General transfer comment: VC for hand placement (+2 for line mgt only)      Balance Overall balance assessment: Needs assistance Sitting-balance support: No upper extremity supported, Feet supported, Single extremity supported Sitting balance-Leahy Scale: Good     Standing balance support: Bilateral upper extremity supported Standing balance-Leahy Scale: Fair                             ADL either performed or assessed with clinical judgement   ADL Overall ADL's : Needs assistance/impaired               Lower Body Bathing Details (indicate cue type and reason): anticipate PRN MIN A     Lower Body Dressing: Sitting/lateral leans;Supervision/safety Lower Body Dressing Details (indicate cue type and reason): able to don socks while long sitting in bed wihtout direct assist Toilet Transfer: Contact guard assist;Supervision/safety;BSC/3in1;Rolling walker (2 wheels) Toilet Transfer Details (indicate cue type and reason): VC for hand placement Toileting- Clothing Manipulation and Hygiene: Set  up;Sitting/lateral lean       Functional mobility during ADLs: Supervision/safety;Contact guard assist;Rolling walker (2 wheels)       Vision         Perception         Praxis         Pertinent Vitals/Pain Pain Assessment Pain  Assessment: 0-10 Pain Score: 2  Pain Location: medial knee Pain Descriptors / Indicators: Aching Pain Intervention(s): Monitored during session, Premedicated before session, Repositioned     Extremity/Trunk Assessment Upper Extremity Assessment Upper Extremity Assessment: Overall WFL for tasks assessed   Lower Extremity Assessment Lower Extremity Assessment: Defer to PT evaluation;LLE deficits/detail LLE Deficits / Details: mild pain with WBing 2/2 gangrenous toe as well as incisions       Communication Communication Communication: No apparent difficulties   Cognition Arousal: Alert Behavior During Therapy: WFL for tasks assessed/performed Overall Cognitive Status: Within Functional Limits for tasks assessed                                       General Comments       Exercises Other Exercises Other Exercises: Pt educated in AE for LB ADL, positioning to support hip extension to neutral while groin incision heals, and falls prevention strategies wiht RW.   Shoulder Instructions      Home Living Family/patient expects to be discharged to:: Private residence Living Arrangements: Alone Available Help at Discharge: Family;Available PRN/intermittently (sister lives close by; son and sister will help PRN after discharge, son will help care for her cats) Type of Home: House (condo) Home Access: Stairs to enter Secretary/administrator of Steps: 2 Entrance Stairs-Rails: Right;Left;Can reach both Home Layout: One level     Bathroom Shower/Tub: Runner, broadcasting/film/video: None          Prior Functioning/Environment Prior Level of Function : Independent/Modified Independent;Driving                        OT Problem List: Pain;Impaired balance (sitting and/or standing);Decreased knowledge of use of DME or AE      OT Treatment/Interventions: Self-care/ADL training;Therapeutic exercise;Therapeutic activities;DME and/or AE  instruction;Patient/family education;Balance training    OT Goals(Current goals can be found in the care plan section) Acute Rehab OT Goals Patient Stated Goal: be independent OT Goal Formulation: With patient Time For Goal Achievement: 05/14/23 Potential to Achieve Goals: Good ADL Goals Additional ADL Goal #1: Pt will complete all aspects of bathing from seated/standing positions wiht modified independence, 1/1 opportunity. Additional ADL Goal #2: Pt will verbalize plan to implement at least 1 learned falls prevention strategy to minimize risk and fear of falling.  OT Frequency: Min 1X/week    Co-evaluation PT/OT/SLP Co-Evaluation/Treatment: Yes Reason for Co-Treatment: To address functional/ADL transfers PT goals addressed during session: Mobility/safety with mobility OT goals addressed during session: ADL's and self-care      AM-PAC OT "6 Clicks" Daily Activity     Outcome Measure Help from another person eating meals?: None Help from another person taking care of personal grooming?: None Help from another person toileting, which includes using toliet, bedpan, or urinal?: A Little Help from another person bathing (including washing, rinsing, drying)?: A Little Help from another person to put on and taking off regular upper body clothing?: None Help from another person to put on and taking off regular  lower body clothing?: A Little 6 Click Score: 21   End of Session Equipment Utilized During Treatment: Rolling walker (2 wheels)  Activity Tolerance: Patient tolerated treatment well Patient left: in bed;with call bell/phone within reach;with bed alarm set  OT Visit Diagnosis: Other abnormalities of gait and mobility (R26.89);Pain Pain - Right/Left: Left Pain - part of body: Knee;Ankle and joints of foot;Leg                Time: 4098-1191 OT Time Calculation (min): 35 min Charges:  OT General Charges $OT Visit: 1 Visit OT Evaluation $OT Eval Low Complexity: 1 Low OT  Treatments $Self Care/Home Management : 8-22 mins  Arman Filter., MPH, MS, OTR/L ascom [phone] 04/30/23, 9:48 AM

## 2023-04-30 NOTE — Plan of Care (Signed)
  Problem: Education: Goal: Knowledge of General Education information will improve Description: Including pain rating scale, medication(s)/side effects and non-pharmacologic comfort measures Outcome: Progressing   Problem: Health Behavior/Discharge Planning: Goal: Ability to manage health-related needs will improve Outcome: Progressing   Problem: Clinical Measurements: Goal: Ability to maintain clinical measurements within normal limits will improve Outcome: Progressing Goal: Will remain free from infection Outcome: Progressing   Problem: Activity: Goal: Risk for activity intolerance will decrease Outcome: Progressing   Problem: Coping: Goal: Level of anxiety will decrease Outcome: Progressing   Problem: Pain Managment: Goal: General experience of comfort will improve Outcome: Progressing   Problem: Skin Integrity: Goal: Risk for impaired skin integrity will decrease Outcome: Progressing   Problem: Health Behavior/Discharge Planning: Goal: Ability to identify and utilize available resources and services will improve Outcome: Progressing   Problem: Clinical Measurements: Goal: Postoperative complications will be avoided or minimized Outcome: Progressing Goal: Signs and symptoms of graft occlusion will improve Outcome: Progressing   Problem: Skin Integrity: Goal: Demonstration of wound healing without infection will improve Outcome: Progressing

## 2023-04-30 NOTE — Evaluation (Signed)
Physical Therapy Evaluation Patient Details Name: Laura Reed MRN: [id number] DOB: 13-Jul-1960 Today's Date: 04/30/2023  History of Present Illness  62 y/o female admitted s/p mechanical thrombectomy of L common femoral and profunda femoris and L femoral-popliteal bypass with stent placement on 10/18. S/p bypass graft femoral-popliteal artery on 10/23. PMH: HTN, CHF, CAD, PVD  Clinical Impression  Patient admitted with the above. PTA, patient lives alone and independent, but has son and sister that are available to check on her. Patient presents with weakness, impaired balance, and decreased activity tolerance. Complaining of mild pain at incision sites and gangrenous L 5th toe. Patient required CGA for bed mobility and sit to stand transfer. Ambulated 64' with RW and CGA+2 for line management. Patient easily distracted and demonstrates slowed gait speed with conversation. Patient will benefit from skilled PT services during acute stay to address listed deficits. Patient will benefit from ongoing therapy at discharge to maximize functional independence and safety.         If plan is discharge home, recommend the following: A little help with walking and/or transfers;A little help with bathing/dressing/bathroom;Assistance with cooking/housework;Assist for transportation;Help with stairs or ramp for entrance   Can travel by private vehicle        Equipment Recommendations Rolling Varsha Knock (2 wheels);BSC/3in1  Recommendations for Other Services       Functional Status Assessment Patient has had a recent decline in their functional status and demonstrates the ability to make significant improvements in function in a reasonable and predictable amount of time.     Precautions / Restrictions Precautions Precautions: Fall Precaution Comments: wound vac Restrictions Weight Bearing Restrictions: No      Mobility  Bed Mobility Overal bed mobility: Needs Assistance Bed Mobility: Supine to  Sit, Sit to Supine     Supine to sit: Supervision Sit to supine: Supervision        Transfers Overall transfer level: Needs assistance Equipment used: Rolling Praise Stennett (2 wheels) Transfers: Sit to/from Stand Sit to Stand: Contact guard assist           General transfer comment: cues for hand placement    Ambulation/Gait Ambulation/Gait assistance: Contact guard assist, +2 safety/equipment Gait Distance (Feet): 60 Feet Assistive device: Rolling Jadon Ressler (2 wheels) Gait Pattern/deviations: Step-to pattern, Decreased stride length, Decreased stance time - left Gait velocity: decreased     General Gait Details: CGA for safety. +2 for line management. Cues for increasing gait speed. Slower gait speed when patient conversing  Stairs            Wheelchair Mobility     Tilt Bed    Modified Rankin (Stroke Patients Only)       Balance Overall balance assessment: Needs assistance Sitting-balance support: No upper extremity supported, Feet supported, Single extremity supported Sitting balance-Leahy Scale: Good     Standing balance support: Bilateral upper extremity supported Standing balance-Leahy Scale: Fair                               Pertinent Vitals/Pain Pain Assessment Pain Assessment: 0-10 Pain Score: 2  Pain Location: medial knee Pain Descriptors / Indicators: Aching Pain Intervention(s): Monitored during session    Home Living Family/patient expects to be discharged to:: Private residence Living Arrangements: Alone Available Help at Discharge: Family;Available PRN/intermittently (sister lives close by; son and sister will help PRN after discharge, son will help care for her cats) Type of Home: House (condo) Home Access: Stairs  to enter Entrance Stairs-Rails: Right;Left;Can reach both Entrance Stairs-Number of Steps: 2   Home Layout: One level Home Equipment: None      Prior Function Prior Level of Function : Independent/Modified  Independent;Driving                     Extremity/Trunk Assessment   Upper Extremity Assessment Upper Extremity Assessment: Defer to OT evaluation    Lower Extremity Assessment Lower Extremity Assessment: Generalized weakness LLE Deficits / Details: mild pain with WBing 2/2 gangrenous toe as well as incisions       Communication   Communication Communication: No apparent difficulties  Cognition Arousal: Alert Behavior During Therapy: WFL for tasks assessed/performed Overall Cognitive Status: Within Functional Limits for tasks assessed                                          General Comments      Exercises     Assessment/Plan    PT Assessment Patient needs continued PT services  PT Problem List Decreased strength;Decreased activity tolerance;Decreased balance;Decreased mobility;Decreased safety awareness;Decreased knowledge of use of DME;Decreased knowledge of precautions       PT Treatment Interventions Gait training;DME instruction;Stair training;Functional mobility training;Therapeutic activities;Therapeutic exercise;Balance training;Patient/family education    PT Goals (Current goals can be found in the Care Plan section)  Acute Rehab PT Goals Patient Stated Goal: to get better PT Goal Formulation: With patient Time For Goal Achievement: 05/14/23 Potential to Achieve Goals: Good    Frequency Min 1X/week     Co-evaluation PT/OT/SLP Co-Evaluation/Treatment: Yes Reason for Co-Treatment: To address functional/ADL transfers PT goals addressed during session: Mobility/safety with mobility OT goals addressed during session: ADL's and self-care       AM-PAC PT "6 Clicks" Mobility  Outcome Measure Help needed turning from your back to your side while in a flat bed without using bedrails?: A Little Help needed moving from lying on your back to sitting on the side of a flat bed without using bedrails?: A Little Help needed moving to and  from a bed to a chair (including a wheelchair)?: A Little Help needed standing up from a chair using your arms (e.g., wheelchair or bedside chair)?: A Little Help needed to walk in hospital room?: A Little Help needed climbing 3-5 steps with a railing? : A Lot 6 Click Score: 17    End of Session   Activity Tolerance: Patient tolerated treatment well Patient left: in bed;with call bell/phone within reach Nurse Communication: Mobility status PT Visit Diagnosis: Unsteadiness on feet (R26.81);Muscle weakness (generalized) (M62.81);Difficulty in walking, not elsewhere classified (R26.2)    Time: 1610-9604 PT Time Calculation (min) (ACUTE ONLY): 32 min   Charges:   PT Evaluation $PT Eval Moderate Complexity: 1 Mod   PT General Charges $$ ACUTE PT VISIT: 1 Visit         Maylon Peppers, PT, DPT Physical Therapist - [facility]  [facility]   Audray Rumore A Nickey Canedo 04/30/2023, 10:25 AM

## 2023-04-30 NOTE — Progress Notes (Signed)
[facility]  Daily Progress Note   Subjective  - 1 Day Post-Op  Patient notes "my foot does not feel good"  Objective Vitals:   04/30/23 1700 04/30/23 1800 04/30/23 1900 04/30/23 2000  BP: (!) 165/56 129/81 122/84 (!) 121/47  Pulse: 68 68 67 69  Resp: 12 (!) 25 (!) 24 15  Temp:    99.3 F (37.4 C)  TempSrc:    Oral  SpO2: 95% 96% 97% 94%  Weight:      Height:        Intake/Output Summary (Last 24 hours) at 04/30/2023 2203 Last data filed at 04/30/2023 1900 Gross per 24 hour  Intake 3168.06 ml  Output 500 ml  Net 2668.06 ml    PULM  Normal effort , no use of accessory muscles CV  No JVD, RRR Abd      No distended, nontender VASC  I am unable to obtain Doppler pulses in the left foot.  Strong Doppler pulse in the right groin indicating the femorofemoral is widely patent  Laboratory CBC    Component Value Date/Time   WBC 15.4 (H) 04/30/2023 0245   HGB 11.7 (L) 04/30/2023 0245   HGB 9.5 (L) 11/02/2014 0609   HCT 33.3 (L) 04/30/2023 0245   HCT 29.5 (L) 11/02/2014 0609   PLT 203 04/30/2023 0245   PLT 217 11/02/2014 0609    BMET    Component Value Date/Time   NA 136 04/30/2023 0114   NA 136 08/02/2021 1629   NA 135 11/02/2014 0609   K 4.2 04/30/2023 0114   K 4.2 11/02/2014 0609   CL 108 04/30/2023 0114   CL 107 11/02/2014 0609   CO2 21 (L) 04/30/2023 0114   CO2 23 11/02/2014 0609   GLUCOSE 204 (H) 04/30/2023 0114   GLUCOSE 108 (H) 11/02/2014 0609   BUN 19 04/30/2023 0114   BUN 25 08/02/2021 1629   BUN 20 11/02/2014 0609   CREATININE 0.71 04/30/2023 0114   CREATININE 1.01 11/09/2015 1549   CREATININE 1.01 11/09/2015 1549   CALCIUM 7.6 (L) 04/30/2023 0114   CALCIUM 7.3 (L) 11/02/2014 0609   GFRNONAA >60 04/30/2023 0114   GFRNONAA 50 (L) 11/02/2014 0609   GFRAA >60 01/19/2019 0357   GFRAA 58 (L) 11/02/2014 0609    Assessment/Planning: POD # 1 s/p left femoral to popliteal bypass with PTFE  At this point the patient's foot is  not threatened however it clearly is not improved as I would hope.  I will plan for angiography tomorrow with possible intervention.  Risks and benefits were reviewed with the patient Sister was in attendance.  We will move forward tomorrow with hopeful limb salvage   [name]  04/30/2023, 10:03 PM

## 2023-04-30 NOTE — Inpatient Diabetes Management (Signed)
Inpatient Diabetes Program Recommendations  AACE/ADA: New Consensus Statement on Inpatient Glycemic Control   Target Ranges:  Prepandial:   less than 140 mg/dL      Peak postprandial:   less than 180 mg/dL (1-2 hours)      Critically ill patients:  140 - 180 mg/dL    Latest Reference Range & Units 04/30/23 07:37 04/30/23 11:17  Glucose-Capillary 70 - 99 mg/dL 657 (H) 846 (H)    Latest Reference Range & Units 04/29/23 00:29 04/29/23 09:22 04/29/23 12:07 04/29/23 17:38 04/29/23 21:15  Glucose-Capillary 70 - 99 mg/dL 962 (H) 952 (H) 841 (H) 205 (H) 235 (H)   Review of Glycemic Control  Diabetes history: DM2 Outpatient Diabetes medications: Tresiba 20 units daily, Humalog 10 units TID with meals, Jardiance 25 mg daily Current orders for Inpatient glycemic control: Novolog 0-15 units TID with meals  Inpatient Diabetes Program Recommendations:    Insulin: Patient received Decadron 5 mg at 13:59 which is contributing to hyperglycemia.   Please consider ordering Novolog 2 units TID with meals for meal coverage if patient eats at least 50% of meals and adding Novolog 0-5 units at bedtime.  Thanks, Orlando Penner, RN, MSN, CDCES Diabetes Coordinator Inpatient Diabetes Program [phone] (Team Pager from 8am to 5pm)

## 2023-04-30 NOTE — TOC Initial Note (Signed)
Transition of Care [facility]) - Initial/Assessment Note    Patient Details  Name: Laura Reed MRN: [id number] Date of Birth: 08-Nov-1960  Transition of Care [facility]) CM/SW Contact:    Chapman Fitch, RN Phone Number: 04/30/2023, 4:34 PM  Clinical Narrative:                  Admitted for:Atherosclerotic occlusive disease bilateral lower extremities with ulceration of the left foot  Admitted from: home alone WUJ:[id number] Current home health/prior home health/DME:NA  Therapy recommending home health. Patient in agreement. She request that I check with [facility] that she had previously, but if they are not able to accept she states she does not have a preference of agency.  Per Adelina Mings with [facility] they are unable to accept .  Referral made to [employer] [facility] with [facility] Patient in agreement for RW, does not feel that [facility] is indicated at this time. TOC to order DME closer to discharge  Expected Discharge Plan: Home w Home Health Services     Patient Goals and CMS Choice   CMS Medicare.gov Compare Post Acute Care list provided to:: Patient Choice offered to / list presented to : Patient      Expected Discharge Plan and Services     Post Acute Care Choice: Home Health Living arrangements for the past 2 months: Apartment                                      Prior Living Arrangements/Services Living arrangements for the past 2 months: Apartment Lives with:: Self                   Activities of Daily Living   ADL Screening (condition at time of admission) Independently performs ADLs?: Yes (appropriate for developmental age) Is the patient deaf or have difficulty hearing?: No Does the patient have difficulty seeing, even when wearing glasses/contacts?: No Does the patient have difficulty concentrating, remembering, or making decisions?: No  Permission Sought/Granted                  Emotional Assessment              Admission diagnosis:  Atherosclerosis of  artery of extremity with rest pain [facility]) [I70.229] Patient Active Problem List   Diagnosis Date Noted   Atherosclerosis of artery of extremity with rest pain (HCC) 04/24/2023   Tubular adenoma of colon 04/03/2022   Positive colorectal cancer screening using Cologuard test 12/10/2021   Effusion of right knee 10/16/2021   Acute pain of right knee 10/16/2021   Ventricular bigeminy 08/02/2021   History of amputation of toe (HCC) 06/04/2021   Anxiety state 04/01/2017   Atherosclerosis of native arteries of extremity with intermittent claudication (HCC) 01/18/2017   Carotid stenosis 12/04/2016   Insomnia secondary to anxiety 02/21/2016   Thyroid nodule 02/21/2016   Hospital discharge follow-up 11/11/2015   Encounter for preventive health examination 08/02/2015   S/p nephrectomy 05/15/2015   Diabetic nephropathy with proteinuria (HCC) 01/27/2015   Overweight (BMI 25.0-29.9) 10/07/2014   History of renal cell carcinoma 09/28/2014   CAD (coronary artery disease) 08/24/2014   Insulin-requiring or dependent type II diabetes mellitus (HCC) 08/24/2014   Hernia of abdominal cavity 08/10/2014   Hyperlipidemia associated with type 2 diabetes mellitus (HCC) 06/17/2014   S/P carotid endarterectomy 01/22/2014   Cardiac arrhythmia 11/14/2013   History of shingles 05/18/2013   Preoperative evaluation  of a medical condition to rule out surgical contraindications (TAR required) 04/06/2013   Diabetic peripheral neuropathy associated with type 2 diabetes mellitus (HCC) 04/06/2013   Chronic hip pain 01/16/2013   Routine general medical examination at a health care facility 06/26/2012   PVD (peripheral vascular disease) (HCC)    History of tobacco abuse 04/29/2011   Controlled type 2 DM with peripheral circulatory disorder (HCC)    Hypertension    Hemorrhoid    PCP:  Sherlene Shams, MD Pharmacy:   [id number] [place], [place] - [address] AT [facility] [address] [place] [address] Phone: [phone] Fax: [phone]     Social Determinants of Health (SDOH) Social History: SDOH Screenings   Food Insecurity: No Food Insecurity (04/24/2023)  Housing: Low Risk  (04/24/2023)  Transportation Needs: No Transportation Needs (04/24/2023)  Utilities: Not At Risk (04/24/2023)  Alcohol Screen: Low Risk  (09/27/2020)  Depression (PHQ2-9): Low Risk  (09/08/2022)  Financial Resource Strain: Low Risk  (06/12/2022)  Physical Activity: Inactive (06/10/2021)  Social Connections: Moderately Isolated (06/12/2022)  Stress: No Stress Concern Present (06/12/2022)  Tobacco Use: Medium Risk (04/29/2023)   SDOH Interventions:     Readmission Risk Interventions     No data to display

## 2023-05-01 ENCOUNTER — Encounter: Payer: Self-pay | Admitting: Certified Registered"

## 2023-05-01 ENCOUNTER — Encounter
Admission: RE | Disposition: A | Discharge status: Skilled Nursing Facility | Payer: Self-pay | Source: Home / Self Care | Attending: Vascular Surgery

## 2023-05-01 ENCOUNTER — Encounter: Payer: Self-pay | Admitting: Vascular Surgery

## 2023-05-01 DIAGNOSIS — E1169 Type 2 diabetes mellitus with other specified complication: Secondary | ICD-10-CM | POA: Diagnosis not present

## 2023-05-01 HISTORY — PX: LOWER EXTREMITY ANGIOGRAPHY: CATH118251

## 2023-05-01 LAB — GLUCOSE, CAPILLARY
Glucose-Capillary: 293 mg/dL — ABNORMAL HIGH (ref 70–99)
Glucose-Capillary: 140 mg/dL — ABNORMAL HIGH (ref 70–99)
Glucose-Capillary: 157 mg/dL — ABNORMAL HIGH (ref 70–99)
Glucose-Capillary: 130 mg/dL — ABNORMAL HIGH (ref 70–99)

## 2023-05-01 LAB — HEPARIN LEVEL (UNFRACTIONATED): Heparin Unfractionated: <0.1 [IU]/mL — ABNORMAL LOW (ref 0.30–0.70)

## 2023-05-01 SURGERY — LOWER EXTREMITY ANGIOGRAPHY
Anesthesia: Moderate Sedation | Laterality: Left

## 2023-05-01 MED ORDER — SODIUM CHLORIDE 0.9 % IV SOLN: INTRAVENOUS | Status: AC

## 2023-05-01 MED ORDER — OXYCODONE HCL 5 MG PO TABS
5.0000 mg | ORAL_TABLET | ORAL | Status: DC | PRN
  Administered 2023-05-02 – 2023-05-05 (×3): 5 mg via ORAL
  Administered 2023-05-06: 10 mg via ORAL
  Administered 2023-05-06 – 2023-05-08 (×3): 5 mg via ORAL
  Filled 2023-05-01 (×2): qty 1
  Filled 2023-05-01: qty 2
  Filled 2023-05-01 (×4): qty 1
  Filled 2023-05-01: qty 2

## 2023-05-01 MED ORDER — IODIXANOL 320 MG/ML IV SOLN
INTRAVENOUS | Status: DC | PRN
  Administered 2023-05-01: 70 mL

## 2023-05-01 MED ORDER — CEFAZOLIN SODIUM-DEXTROSE 2-4 GM/100ML-% IV SOLN
INTRAVENOUS | Status: AC
  Filled 2023-05-01: qty 100

## 2023-05-01 MED ORDER — HEPARIN (PORCINE) 25000 UT/250ML-% IV SOLN
850.0000 [IU]/h | INTRAVENOUS | Status: AC
  Administered 2023-05-01: 600 [IU]/h via INTRAVENOUS

## 2023-05-01 MED ORDER — CLOPIDOGREL BISULFATE 300 MG PO TABS
300.0000 mg | ORAL_TABLET | Freq: Once | ORAL | Status: AC
  Administered 2023-05-01: 300 mg via ORAL
  Filled 2023-05-01: qty 1

## 2023-05-01 MED ORDER — SODIUM CHLORIDE 0.9% FLUSH: 3.0000 mL | INTRAVENOUS | Status: DC | PRN

## 2023-05-01 MED ORDER — CLOPIDOGREL BISULFATE 300 MG PO TABS: 300.0000 mg | ORAL_TABLET | Freq: Every day | ORAL | Status: DC

## 2023-05-01 MED ORDER — MIDAZOLAM HCL 5 MG/5ML IJ SOLN
INTRAMUSCULAR | Status: AC
Start: 2023-05-01 — End: ?
  Filled 2023-05-01: qty 5

## 2023-05-01 MED ORDER — HEPARIN SODIUM (PORCINE) 1000 UNIT/ML IJ SOLN
INTRAMUSCULAR | Status: DC | PRN
  Administered 2023-05-01: 5000 [IU] via INTRAVENOUS
  Administered 2023-05-01: 1000 [IU] via INTRAVENOUS

## 2023-05-01 MED ORDER — HEPARIN (PORCINE) IN NACL 1000-0.9 UT/500ML-% IV SOLN
INTRAVENOUS | Status: DC | PRN
  Administered 2023-05-01: 1000 mL

## 2023-05-01 MED ORDER — MIDAZOLAM HCL 2 MG/2ML IJ SOLN
INTRAMUSCULAR | Status: DC | PRN
  Administered 2023-05-01: 2 mg via INTRAVENOUS
  Administered 2023-05-01: 1 mg via INTRAVENOUS

## 2023-05-01 MED ORDER — SODIUM CHLORIDE 0.9% FLUSH
3.0000 mL | Freq: Two times a day (BID) | INTRAVENOUS | Status: DC
  Administered 2023-05-01 – 2023-05-11 (×19): 3 mL via INTRAVENOUS

## 2023-05-01 MED ORDER — SODIUM CHLORIDE 0.9 % IV SOLN: 250.0000 mL | INTRAVENOUS | Status: AC | PRN

## 2023-05-01 MED ORDER — FENTANYL CITRATE (PF) 100 MCG/2ML IJ SOLN
INTRAMUSCULAR | Status: DC | PRN
  Administered 2023-05-01: 25 ug via INTRAVENOUS
  Administered 2023-05-01: 50 ug via INTRAVENOUS

## 2023-05-01 MED ORDER — FENTANYL CITRATE (PF) 100 MCG/2ML IJ SOLN
INTRAMUSCULAR | Status: AC
  Filled 2023-05-01: qty 2

## 2023-05-01 MED ORDER — LIDOCAINE HCL (PF) 1 % IJ SOLN
INTRAMUSCULAR | Status: DC | PRN
  Administered 2023-05-01: 10 mL

## 2023-05-01 MED ORDER — HEPARIN SODIUM (PORCINE) 1000 UNIT/ML IJ SOLN
INTRAMUSCULAR | Status: AC
  Filled 2023-05-01: qty 10

## 2023-05-01 SURGICAL SUPPLY — 40 items
BALLN LUTONIX 018 6X100X130 (BALLOONS) ×1
BALLN LUTONIX 6X120X130 (BALLOONS) ×1
BALLN TREK RX 2.5X20 (BALLOONS)
BALLN ULTRV 018 7X40X75 (BALLOONS) ×1
BALLOON LUTONIX 018 6X100X130 (BALLOONS) IMPLANT
BALLOON LUTONIX 6X120X130 (BALLOONS) IMPLANT
BALLOON TREK RX 2.5X20 (BALLOONS) IMPLANT
BALLOON ULTRV 018 7X40X75 (BALLOONS) IMPLANT
CANISTER PENUMBRA ENGINE (MISCELLANEOUS) IMPLANT
CATH 0.018 NAVICROSS ANG 135 (CATHETERS) IMPLANT
CATH ANGIO 5F PIGTAIL 65CM (CATHETERS) IMPLANT
CATH BEACON 5 .035 100 JB1 TIP (CATHETERS) IMPLANT
CATH BEACON 5 .035 40 KMP TP (CATHETERS) IMPLANT
CATH BEACON 5 .035 65 KMP TIP (CATHETERS) IMPLANT
CATH BEACON 5 .038 100 VERT TP (CATHETERS) IMPLANT
CATH INDIGO CAT6 KIT (CATHETERS) IMPLANT
CATH NAVICROSS ANGLED 135CM (MICROCATHETER) IMPLANT
CATH NAVICROSS ANGLED 90CM (MICROCATHETER) IMPLANT
CATH VERT 5X100 (CATHETERS) IMPLANT
COVER PROBE ULTRASOUND 5X96 (MISCELLANEOUS) IMPLANT
DEVICE STARCLOSE SE CLOSURE (Vascular Products) IMPLANT
GLIDEWIRE ADV .014X300CM (WIRE) IMPLANT
GLIDEWIRE ADV .035X260CM (WIRE) IMPLANT
GOWN STRL REUS W/ TWL LRG LVL3 (GOWN DISPOSABLE) ×1 IMPLANT
GOWN STRL REUS W/TWL LRG LVL3 (GOWN DISPOSABLE) ×1
GUIDEWIRE ANGLED .035X260CM (WIRE) IMPLANT
GUIDEWIRE PFTE-COATED .018X300 (WIRE) IMPLANT
KIT ENCORE 26 ADVANTAGE (KITS) IMPLANT
NDL ENTRY 21GA 7CM ECHOTIP (NEEDLE) IMPLANT
NEEDLE ENTRY 21GA 7CM ECHOTIP (NEEDLE) ×1 IMPLANT
PACK ANGIOGRAPHY (CUSTOM PROCEDURE TRAY) ×1 IMPLANT
SET INTRO CAPELLA COAXIAL (SET/KITS/TRAYS/PACK) IMPLANT
SHEATH BRITE TIP 5FRX11 (SHEATH) IMPLANT
SHEATH BRITE TIP 6FR X 23 (SHEATH) IMPLANT
STENT ONYX FRONTIER 2.5X22 (Permanent Stent) IMPLANT
STENT ONYX FRONTIER 3.0X12 (Permanent Stent) IMPLANT
STENT VIABAHN 6X100X120 (Permanent Stent) IMPLANT
SYR MEDRAD MARK 7 150ML (SYRINGE) IMPLANT
TUBING CONTRAST HIGH PRESS 72 (TUBING) IMPLANT
WIRE GUIDERIGHT .035X150 (WIRE) IMPLANT

## 2023-05-01 NOTE — Progress Notes (Signed)
On assessment unable to palpate or auscultate a doppler pulse on the left posterior tibial and left dorsalis pedis locations.  Dr. Gilda Crease notified of findings; will continue to monitor patient.

## 2023-05-01 NOTE — Progress Notes (Signed)
Patient returned to room at 1715, pulses doppler able in both left and right foot, access sites are intact.

## 2023-05-01 NOTE — Op Note (Signed)
[facility]  Percutaneous Study/Intervention Procedural Note   Date of Surgery: 05/01/2023  Surgeon:  [name]  Pre-operative Diagnosis: Atherosclerotic occlusive disease bilateral lower extremities with left lower extremity with rest pain and gangrene of the fifth toe.  Post-operative diagnosis:  Same  Procedure(s) Performed:             1.  Introduction catheter into left lower extremity 3rd order catheter placement               2.    Contrast injection left lower extremity for distal runoff             3.  Percutaneous transluminal angioplasty and stent placement left common femoral to 7 mm with a 6 mm Viabahn stent             4.  Percutaneous transluminal angioplasty and stent placement within Onyx stent left anterior tibial artery             5.  Mechanical thrombectomy of the distal common femoral artery and femoral distal bypass graft with the penumbra CAT 6 device.                6.  Star close closure right side of the femoral femoral bypass graft arteriotomy  Anesthesia: Conscious sedation was administered under my direct supervision by the interventional radiology RN. IV Versed plus fentanyl were utilized. Continuous ECG, pulse oximetry and blood pressure was monitored throughout the entire procedure.  Conscious sedation was for a total of 105 minutes.  Sheath: 23 cm 6 French Pinnacle sheath retrograde right side of the femoral-femoral bypass graft  Contrast: 70 cc  Fluoroscopy Time: 33.6 minutes  Indications:  Newell Coral presents with increasing pain of the left lower extremity.  She is status post femoral distal bypass graft which appears to have thrombosed.  Given she has gangrenous changes to the fifth toe she is being brought to special procedures for angiography with the possibility for intervention and or in preparation for revision of her bypass graft.  This suggests the patient is having limb threatening ischemia. The risks and  benefits are reviewed all questions answered patient agrees to proceed.  Procedure:   DONTASIA PARKE is a 62 y.o. y.o. female who was identified and appropriate procedural time out was performed.  The patient was then placed supine on the table and prepped and draped in the usual sterile fashion.    Ultrasound was placed in the sterile sleeve and the right groin was evaluated the right side of the femoral-femoral bypass graft was echolucent and pulsatile indicating patency.  Image was recorded for the permanent record and under real-time visualization a microneedle was inserted into the common femoral artery followed by the microwire and then the micro-sheath.  A J-wire was then advanced through the micro-sheath and a  5 [place] sheath was then inserted over a J-wire.  Using a 40 cm Kumpe and an advantage wire I was able to negotiate the Kumpe and wire into the femoral distal bypass graft.  I then exchanged for a longer Kumpe catheter and negotiated the wire catheter down into the distal anastomosis.  Hand-injection of contrast was performed at this level which demonstrated minimal thrombus within the distal anastomosis and distal portion of the bypass graft.  The peroneal artery has a 2 to 3 cm segment of occlusion.  The anterior tibial artery is patent at the anastomosis but demonstrates a greater than 80% stenosis approximately 5  mm distal to the anastomosis which is suspicious for a clamp injury.  There is a secondary greater than 60% stenosis distal to this lesion in the anterior tibial.  Below that level the anterior tibial is patent down to the foot and fills the dorsalis pedis.  6000 units of heparin was then given and allowed to circulate for several minutes an additional 1000 units was given later in the case.  A 23 cm 6 French sheath sheath was advanced up to the anastomosis of the femoral-femoral bypass on the left side  The penumbra CAT 6 device was then prepped on the field.  The catheter was  negotiated into the anterior tibial which is how I was able to do the distal runoff.  I then advanced a 0.014 advantage wire and remove the catheter.  Penumbra CAT 6 was then advanced over the 014 wire and mechanical thrombectomy was performed of the distal left common femoral and the entire length of the bypass graft.  After 2 complete passes with aspiration both while advancing as well as while removing the catheter I performed angiography through the sheath.  This now demonstrated that the femoral distal bypass was patent.  Within the proximal common femoral area there was clearly a greater than 70% narrowing which appeared to be attributable to scar tissue.  This extended over a distance of approximately 5 to 6 cm.  Distally as noted above there was a high-grade lesion in the anterior tibial with a secondary lesion somewhat more distal.  The actual anastomosis appeared to be patent.  There is an occlusion of the peroneal over several centimeters.  Having negotiated the 014 wire into the distal anterior tibial artery I elected to treat over this wire.  I selected a 6 mm x 100 mm Viabahn stent and deployed the stent so that the leading edge was in the distal common femoral artery and extends into the bypass graft.  He was then postdilated with a 6 mm x 100 mm Lutonix balloon.  The area that was the most stenotic remained undersized and I selected a 7 mm x 40 mm balloon and dilated this specific segment inflate shin was to 12 atm for 1 minute.  Follow-up imaging now demonstrated less than 15% residual stenosis at the origin of the bypass graft including the common femoral.  The remaining portion of the bypass graft was now also noted to be free of thrombus including the distal anastomosis.  I then obtained a magnified image of the distal anastomosis and the anterior tibial.  I selected a 3 mm x 12 mm Onyx stent advanced across the first lesion and deployed the stent to 15 atm.  At that point I did not truly  appreciate the extent of the more distal lesion and withdrew the wire.  Using a Kumpe catheter and the wire was attempting to negotiate the occlusion of the peroneal when obtaining a view and has different angle by hand-injection I noted that the second lesion in the anterior tibial was significantly worse than I thought in this view it appeared to be closer to 6070% and given this was the only outflow and I was not able to cross the peroneal occlusion I felt that this must be treated to maintain patency of the bypass graft.  I therefore renegotiated the wire into the anterior tibial and subsequently once confirmed I was intraluminal deployed a second Onyx stent, 2.5 x 20 mm.  This overlapped the initial stent and I postdilated the overlap  with a 3 mm balloon from the original stent.  The 2.5 mm Onyx stent was deployed to 12 atm.  Follow-up imaging demonstrated less than 10% residual stenosis throughout the anterior tibial the anastomosis is widely patent and there is now filling of the dorsalis pedis with brisk flow.  After review of these images the sheath is pulled into the right side of the femoral-femoral bypass hand-injection is obtained and a Star close device deployed. There no immediate Complications.  Findings:   Initial images demonstrate the proximal left common femoral is widely patent as is the femoral-femoral bypass and the left-sided anastomosis.  The bypass graft is occluded on the initial imaging.  Once had successfully cannulated and was able to get a catheter down to the distal anastomosis I found there was minimal thrombus in the distal anastomosis and as described above 2 lesions noted in the anterior tibial 180% and 1 greater than 60% both located proximally.  The peroneal is occluded just beyond the anastomosis for a distance of approximately 2 to 3 cm.  It is patent beyond that.  Once I had performed successful thrombectomy using the penumbra CAT 6 I had also identified a high-grade  lesion at the origin of the bypass which appears to be secondary to scar tissue.  Following Viabahn stent placement approximately and angioplastied to 7 mm and then stent placement in the anterior tibial as described above there is now wide patency of the inflow as well as the bypass graft itself the distal anastomosis and the anterior tibial outflow.  Summary: Successful recanalization left lower extremity for limb salvage                           Disposition: Patient was taken to the recovery room in stable condition having tolerated the procedure well.  Earl Lites Sofie Schendel 05/01/2023,5:20 PM

## 2023-05-01 NOTE — Plan of Care (Signed)
Continuing with plan of care. 

## 2023-05-01 NOTE — Plan of Care (Signed)
  Problem: Health Behavior/Discharge Planning: Goal: Ability to manage health-related needs will improve Outcome: Progressing   Problem: Clinical Measurements: Goal: Respiratory complications will improve Outcome: Progressing   Problem: Activity: Goal: Risk for activity intolerance will decrease Outcome: Progressing   Problem: Coping: Goal: Level of anxiety will decrease Outcome: Progressing   Problem: Pain Managment: Goal: General experience of comfort will improve Outcome: Progressing   Problem: Safety: Goal: Ability to remain free from injury will improve Outcome: Progressing

## 2023-05-01 NOTE — Progress Notes (Signed)
PT Cancellation Note  Patient Details Name: Laura Reed MRN: [id number] DOB: [birth year]   Cancelled Treatment:    Reason Eval/Treat Not Completed: Medical issues which prohibited therapy Per RN, patient awaiting angiogram due to lack of pulse in L LE. Will hold until medical clearance.   Maylon Peppers, PT, DPT Physical Therapist - [facility]  [facility]    Beau Vanduzer A Ionna Avis 05/01/2023, 10:38 AM

## 2023-05-01 NOTE — Progress Notes (Signed)
Patient transferred to procedural area in hospital bed and with cardiac monitoring on, this nurse present during transport.

## 2023-05-01 NOTE — Progress Notes (Signed)
PHARMACY - ANTICOAGULATION CONSULT NOTE  Pharmacy Consult for Heparin Infusion Indication:  Limb ischemia status post endovascular intervention  Patient Measurements: Height: 5\' 7"  (170.2 cm) Weight: 61.2 kg (134 lb 14.7 oz) IBW/kg (Calculated) : 61.6 Heparin Dosing Weight: 61.2 kg  Labs: Recent Labs    04/29/23 0415 04/30/23 0114 04/30/23 0245 04/30/23 0729 05/01/23 0523  HGB 13.7 12.1 11.7*  --   --   HCT 39.1 35.8* 33.3*  --   --   PLT 226 194 203  --   --   HEPARINUNFRC 0.36 <0.10*  --  <0.10* <0.10*  CREATININE  --  0.71  --   --   --     Estimated Creatinine Clearance: 68.6 mL/min (by C-G formula based on SCr of 0.71 mg/dL).  Medical History: Past Medical History:  Diagnosis Date   Absence of kidney    left   Anxiety    Arthritis    Bladder cancer (HCC)    CHF (congestive heart failure) (HCC)    Complication of anesthesia    BP HAS  RUN LOW AFTER SURGERY-LUNGS FILLED UP WITH FLUID AFTER  LEG STENT SURGERY    Coronary artery disease    Diabetes mellitus    Family history of adverse reaction to anesthesia    Sister - PONV   GERD (gastroesophageal reflux disease)    OCC TUMS   Heart murmur    Hemorrhoid    History of methicillin resistant staphylococcus aureus (MRSA) 2007   Hypertension    Neuropathy    PVD (peripheral vascular disease) (HCC)    Thyroid nodule    right   Urothelial carcinoma of kidney (HCC) 10/31/2014   INVASIVE UROTHELIAL CARCINOMA, LOW GRADE. T1, Nx.   Wears dentures    full upper and lower   Assessment: Patient is a 62yo female with left lower extremity ischemia, she is s/p mechanical thrombectomy. Pharmacy consulted for heparin Infusion.  Per discussion with vascular team, plan is to restart heparin at 600 units/hr and titrate to goal of 0.2 - 0.5, no bolus protocol. Heparin to stop at 10 am on 10/26 after administration of Plavix.  Goal of Therapy:  Heparin level 0.2 - 0.5 units/ml (NO BOLUS) Monitor platelets by  anticoagulation protocol: Yes   Plan:  --Re-start heparin at 600 units/hr, no bolus --Heparin level 6 hours from initiation of infusion --Stop heparin infusion at 10 am tomorrow --CBC tomorrow AM  Tressie Ellis 05/01/2023

## 2023-05-02 LAB — GLUCOSE, CAPILLARY
Glucose-Capillary: 211 mg/dL — ABNORMAL HIGH (ref 70–99)
Glucose-Capillary: 240 mg/dL — ABNORMAL HIGH (ref 70–99)
Glucose-Capillary: 199 mg/dL — ABNORMAL HIGH (ref 70–99)
Glucose-Capillary: 242 mg/dL — ABNORMAL HIGH (ref 70–99)

## 2023-05-02 LAB — CBC
HCT: 31.8 % — ABNORMAL LOW (ref 36.0–46.0)
Hemoglobin: 10.9 g/dL — ABNORMAL LOW (ref 12.0–15.0)
MCH: 30 pg (ref 26.0–34.0)
MCHC: 34.3 g/dL (ref 30.0–36.0)
MCV: 87.6 fL (ref 80.0–100.0)
Platelets: 225 10*3/uL (ref 150–400)
RBC: 3.63 MIL/uL — ABNORMAL LOW (ref 3.87–5.11)
RDW: 13.7 % (ref 11.5–15.5)
WBC: 12.5 10*3/uL — ABNORMAL HIGH (ref 4.0–10.5)
nRBC: 0 % (ref 0.0–0.2)

## 2023-05-02 LAB — HEPARIN LEVEL (UNFRACTIONATED)
Heparin Unfractionated: <0.1 [IU]/mL — ABNORMAL LOW (ref 0.30–0.70)
Heparin Unfractionated: 0.1 [IU]/mL — ABNORMAL LOW (ref 0.30–0.70)

## 2023-05-02 MED ORDER — FAMOTIDINE 20 MG PO TABS
20.0000 mg | ORAL_TABLET | Freq: Every day | ORAL | Status: DC
  Administered 2023-05-02 – 2023-05-11 (×9): 20 mg via ORAL
  Filled 2023-05-02 (×9): qty 1

## 2023-05-02 MED ORDER — LOSARTAN POTASSIUM 50 MG PO TABS
100.0000 mg | ORAL_TABLET | Freq: Every day | ORAL | Status: DC
  Administered 2023-05-02 – 2023-05-11 (×7): 100 mg via ORAL
  Filled 2023-05-02 (×7): qty 2

## 2023-05-02 MED ORDER — APIXABAN 2.5 MG PO TABS
2.5000 mg | ORAL_TABLET | Freq: Two times a day (BID) | ORAL | Status: DC
  Administered 2023-05-02 – 2023-05-04 (×5): 2.5 mg via ORAL
  Filled 2023-05-02 (×6): qty 1

## 2023-05-02 MED ORDER — AMLODIPINE BESYLATE 5 MG PO TABS
2.5000 mg | ORAL_TABLET | Freq: Every day | ORAL | Status: DC
  Administered 2023-05-02 – 2023-05-11 (×7): 2.5 mg via ORAL
  Filled 2023-05-02 (×7): qty 1

## 2023-05-02 NOTE — Progress Notes (Addendum)
PHARMACY - ANTICOAGULATION CONSULT NOTE  Pharmacy Consult for Heparin Infusion Indication:  Limb ischemia status post endovascular intervention  Patient Measurements: Height: 5\' 7"  (170.2 cm) Weight: 61.2 kg (134 lb 14.7 oz) IBW/kg (Calculated) : 61.6 Heparin Dosing Weight: 61.2 kg  Labs: Recent Labs    04/30/23 0114 04/30/23 0245 04/30/23 0729 05/01/23 0523 05/02/23 0109 05/02/23 0319 05/02/23 0859  HGB 12.1 11.7*  --   --   --  10.9*  --   HCT 35.8* 33.3*  --   --   --  31.8*  --   PLT 194 203  --   --   --  225  --   HEPARINUNFRC <0.10*  --    < > <0.10* <0.10*  --  0.10*  CREATININE 0.71  --   --   --   --   --   --    < > = values in this interval not displayed.    Estimated Creatinine Clearance: 68.6 mL/min (by C-G formula based on SCr of 0.71 mg/dL).  Medical History: Past Medical History:  Diagnosis Date   Absence of kidney    left   Anxiety    Arthritis    Bladder cancer (HCC)    CHF (congestive heart failure) (HCC)    Complication of anesthesia    BP HAS  RUN LOW AFTER SURGERY-LUNGS FILLED UP WITH FLUID AFTER  LEG STENT SURGERY    Coronary artery disease    Diabetes mellitus    Family history of adverse reaction to anesthesia    Sister - PONV   GERD (gastroesophageal reflux disease)    OCC TUMS   Heart murmur    Hemorrhoid    History of methicillin resistant staphylococcus aureus (MRSA) 2007   Hypertension    Neuropathy    PVD (peripheral vascular disease) (HCC)    Thyroid nodule    right   Urothelial carcinoma of kidney (HCC) 10/31/2014   INVASIVE UROTHELIAL CARCINOMA, LOW GRADE. T1, Nx.   Wears dentures    full upper and lower   Assessment: Patient is a 61yo female with left lower extremity ischemia, she is s/p mechanical thrombectomy. Pharmacy consulted for heparin Infusion.  Per discussion with vascular team, plan is to restart heparin at 600 units/hr and titrate to goal of 0.2 - 0.5, no bolus protocol. Heparin to stop at 10 am on 10/26  after administration of Plavix.  10/26 0109 HL: < 0.1 10/26 0859 HL: 0.1   Goal of Therapy:  Heparin level 0.2 - 0.5 units/ml (NO BOLUS) Monitor platelets by anticoagulation protocol: Yes   Plan:  Heparin level is still subtherapeutic. Will increase heparin infusion to 850 units/hr. Recheck heparin level in 6 hours. CBC daily while on heparin. Heparin to stop at 10 am per current order. It will need to be re-ordered if needed to be continued.   Ronnald Ramp, PharmD, BCPS 05/02/2023

## 2023-05-02 NOTE — Plan of Care (Signed)
  Problem: Education: Goal: Knowledge of General Education information will improve Description: Including pain rating scale, medication(s)/side effects and non-pharmacologic comfort measures Outcome: Progressing   Problem: Health Behavior/Discharge Planning: Goal: Ability to manage health-related needs will improve Outcome: Progressing   Problem: Activity: Goal: Risk for activity intolerance will decrease Outcome: Progressing  Pt ambulated on the hall no distress noted Problem: Coping: Goal: Level of anxiety will decrease Outcome: Progressing   Problem: Elimination: Goal: Will not experience complications related to bowel motility Outcome: Progressing   Problem: Safety: Goal: Ability to remain free from injury will improve Outcome: Progressing

## 2023-05-02 NOTE — Progress Notes (Signed)
Occupational Therapy Treatment Patient Details Name: NUZHAT DAGLE MRN: [id number] DOB: 1961-06-02 Today's Date: 05/02/2023   History of present illness 62 y/o female admitted s/p mechanical thrombectomy of L common femoral and profunda femoris and L femoral-popliteal bypass with stent placement on 10/18. S/p bypass graft femoral-popliteal artery on 10/23. PMH: HTN, CHF, CAD, PVD   OT comments  Pt seen for OT treatment on this date. Upon arrival to room pt supine in bed, agreeable to tx. Pt's sister and son present at the beginning of the session. Pt completed a supine>sit t/f with supervision and HOB elevated. Pt completed brushing hair independently while seated at the EOB. Pt fearful/anxious of moving RLE at first, but willing to try.   Pt educated on AE for lower body dressing and verbalized understanding. Pt unable to teach back became frustrated and instead chose and tolerated figure 4 position for donning socks with supervision. Per pt request, pt left seated at the EOB with family present and call bell within reach and all needs met. Pt making good progress toward goals, will continue to follow POC. Discharge recommendation remains appropriate.        If plan is discharge home, recommend the following:  A little help with walking and/or transfers;A little help with bathing/dressing/bathroom;Assistance with cooking/housework;Assist for transportation;Help with stairs or ramp for entrance   Equipment Recommendations  Tub/shower seat;Other (comment)    Recommendations for Other Services      Precautions / Restrictions Precautions Precautions: Fall Restrictions Weight Bearing Restrictions: No       Mobility Bed Mobility Overal bed mobility: Needs Assistance Bed Mobility: Supine to Sit     Supine to sit: Supervision, HOB elevated       Patient Response: Cooperative  Transfers                         Balance Overall balance assessment: Needs  assistance Sitting-balance support: No upper extremity supported, Feet supported, Single extremity supported Sitting balance-Leahy Scale: Good                                     ADL either performed or assessed with clinical judgement   ADL Overall ADL's : Needs assistance/impaired                     Lower Body Dressing: Sitting/lateral leans;Supervision/safety Lower Body Dressing Details (indicate cue type and reason): able to don socks while seated at the EOB without direct assist             Functional mobility during ADLs: Supervision/safety;Set up      Extremity/Trunk Assessment Upper Extremity Assessment Upper Extremity Assessment: Overall WFL for tasks assessed   Lower Extremity Assessment Lower Extremity Assessment: Defer to PT evaluation        Vision Patient Visual Report: No change from baseline     Perception     Praxis      Cognition Arousal: Alert Behavior During Therapy: WFL for tasks assessed/performed Overall Cognitive Status: Within Functional Limits for tasks assessed                                          Exercises      Shoulder Instructions       General Comments  Pertinent Vitals/ Pain       Pain Assessment Pain Assessment: Faces Faces Pain Scale: Hurts a little bit Pain Location: medial knee Pain Descriptors / Indicators: Aching Pain Intervention(s): Monitored during session, Limited activity within patient's tolerance  Home Living                                          Prior Functioning/Environment              Frequency  Min 1X/week        Progress Toward Goals  OT Goals(current goals can now be found in the care plan section)  Progress towards OT goals: Progressing toward goals  Acute Rehab OT Goals Patient Stated Goal: to be independent OT Goal Formulation: With patient Time For Goal Achievement: 05/14/23 Potential to Achieve Goals:  Good  Plan      Co-evaluation                 AM-PAC OT "6 Clicks" Daily Activity     Outcome Measure   Help from another person eating meals?: None Help from another person taking care of personal grooming?: None Help from another person toileting, which includes using toliet, bedpan, or urinal?: A Little Help from another person bathing (including washing, rinsing, drying)?: A Little Help from another person to put on and taking off regular upper body clothing?: None Help from another person to put on and taking off regular lower body clothing?: A Little 6 Click Score: 21    End of Session    OT Visit Diagnosis: Other abnormalities of gait and mobility (R26.89);Pain Pain - Right/Left: Left Pain - part of body: Knee;Ankle and joints of foot;Leg   Activity Tolerance Patient tolerated treatment well   Patient Left in bed;with call bell/phone within reach   Nurse Communication Mobility status        Time: 6578-4696 OT Time Calculation (min): 21 min  Charges:    Butch Penny, SOT

## 2023-05-02 NOTE — Progress Notes (Signed)
1 Day Post-Op   Subjective/Chief Complaint: States she has mild pain in foot and at surgical incisions- well controlled and significantly improved from yesterday.   Objective: Vital signs in last 24 hours: Temp:  [98.3 F (36.8 C)-99.9 F (37.7 C)] 98.9 F (37.2 C) (10/26 0800) Pulse Rate:  [0-93] 73 (10/26 0900) Resp:  [11-28] 22 (10/26 0900) BP: (137-179)/(39-87) 172/57 (10/26 0900) SpO2:  [88 %-100 %] 97 % (10/26 0900) Weight:  [61.2 kg] 61.2 kg (10/25 1353) Last BM Date : 04/23/23  Intake/Output from previous day: 10/25 0701 - 10/26 0700 In: 1050 [P.O.:120; I.V.:730.1; IV Piggyback:199.9] Out: 1070 [Urine:950; Blood:120] Intake/Output this shift: No intake/output data recorded.  General appearance: alert and no distress Resp: clear to auscultation bilaterally Cardio: regular rate and rhythm Extremities: LEFT groin Prevena in place with good seal. Lower leg dressing intact with serosang staining, calf soft, foot warm, multiphasic DP/PT, stable necrosis of toe  Lab Results:  Recent Labs    04/30/23 0245 05/02/23 0319  WBC 15.4* 12.5*  HGB 11.7* 10.9*  HCT 33.3* 31.8*  PLT 203 225   BMET Recent Labs    04/30/23 0114  NA 136  K 4.2  CL 108  CO2 21*  GLUCOSE 204*  BUN 19  CREATININE 0.71  CALCIUM 7.6*   PT/INR No results for input(s): "LABPROT", "INR" in the last 72 hours. ABG No results for input(s): "PHART", "HCO3" in the last 72 hours.  Invalid input(s): "PCO2", "PO2"  Studies/Results: PERIPHERAL VASCULAR CATHETERIZATION  Result Date: 05/01/2023 See surgical note for result.   Anti-infectives: Anti-infectives (From admission, onward)    Start     Dose/Rate Route Frequency Ordered Stop   05/01/23 0600  ceFAZolin (ANCEF) IVPB 2g/100 mL premix        2 g 200 mL/hr over 30 Minutes Intravenous On call to O.R. 04/30/23 2207 05/01/23 1734   04/29/23 2130  ceFAZolin (ANCEF) IVPB 2g/100 mL premix        2 g 200 mL/hr over 30 Minutes Intravenous  Every 8 hours 04/29/23 1802 04/30/23 0600   04/29/23 1658  gentamicin (GARAMYCIN) injection  Status:  Discontinued          As needed 04/29/23 1658 04/29/23 1727   04/29/23 1658  vancomycin (VANCOCIN) powder  Status:  Discontinued          As needed 04/29/23 1659 04/29/23 1727   04/29/23 0730  ceFAZolin (ANCEF) IVPB 2g/100 mL premix        2 g 200 mL/hr over 30 Minutes Intravenous On call to O.R. 04/29/23 9604 04/29/23 1350   04/24/23 2100  ceFAZolin (ANCEF) IVPB 2g/100 mL premix        2 g 200 mL/hr over 30 Minutes Intravenous Every 8 hours 04/24/23 1701 04/25/23 0701   04/24/23 1107  ceFAZolin (ANCEF) IVPB 2g/100 mL premix        2 g 200 mL/hr over 30 Minutes Intravenous 30 min pre-op 04/24/23 1107 04/24/23 1729       Assessment/Plan: S/P LEFT Fem- Distal bypass and subsequent percutaneous thrombectomy, angioplasty and stent ASA/Plavix and Eliquis today once Heparin gtt discontinued. Keep in ICU  for continued monitoring Resume home antihypertensives Plan per Podiatry for toe amputation Monday  LOS: 8 days    Eli Hose A 05/02/2023

## 2023-05-02 NOTE — Plan of Care (Signed)
  Problem: Clinical Measurements: Goal: Diagnostic test results will improve Outcome: Progressing   Problem: Elimination: Goal: Will not experience complications related to bowel motility Outcome: Progressing Goal: Will not experience complications related to urinary retention Outcome: Progressing   Problem: Pain Managment: Goal: General experience of comfort will improve Outcome: Progressing   Problem: Skin Integrity: Goal: Risk for impaired skin integrity will decrease Outcome: Progressing   Problem: Health Behavior/Discharge Planning: Goal: Ability to manage health-related needs will improve Outcome: Progressing   Problem: Nutritional: Goal: Progress toward achieving an optimal weight will improve Outcome: Progressing   Problem: Tissue Perfusion: Goal: Adequacy of tissue perfusion will improve Outcome: Progressing   Problem: Activity: Goal: Ability to tolerate increased activity will improve Outcome: Progressing

## 2023-05-02 NOTE — Progress Notes (Signed)
Physical Therapy Treatment Patient Details Name: Laura Reed MRN: [id number] DOB: December 12, 1960 Today's Date: 05/02/2023   History of Present Illness 62 y/o female admitted s/p mechanical thrombectomy of L common femoral and profunda femoris and L femoral-popliteal bypass with stent placement on 10/18. S/p bypass graft femoral-popliteal artery on 10/23. PMH: HTN, CHF, CAD, PVD    PT Comments  Pt received in Semi-Fowler's position and agreeable to therapy.  Pt looking forward to getting up and moving upon arrival.  Pt feels much more confident in her ability to ambulate and is hoping to progress daily.  Pt performed transfer training, and only required verbal cuing for hand placement.  Pt was able to perform STS with good technique on the second attempt.  Pt then ambulate out the door of the room to the entrance to the ICU before returning back to the room.  Therapist assisted with cable management and CGA for safety.  Pt able to progress step and develop a rhythm as she ambulated further.  Pt returned to the bed and was left with all needs met and call bell within reach.      If plan is discharge home, recommend the following: A little help with walking and/or transfers;A little help with bathing/dressing/bathroom;Assistance with cooking/housework;Assist for transportation;Help with stairs or ramp for entrance   Can travel by private vehicle        Equipment Recommendations  Rolling walker (2 wheels);BSC/3in1    Recommendations for Other Services       Precautions / Restrictions Precautions Precautions: Fall Restrictions Weight Bearing Restrictions: No     Mobility  Bed Mobility Overal bed mobility: Needs Assistance Bed Mobility: Supine to Sit     Supine to sit: Supervision, HOB elevated Sit to supine: Supervision        Transfers Overall transfer level: Needs assistance Equipment used: Rolling walker (2 wheels) Transfers: Sit to/from Stand Sit to Stand: Contact guard  assist           General transfer comment: cues for hand placement    Ambulation/Gait Ambulation/Gait assistance: Contact guard assist, +2 safety/equipment Gait Distance (Feet): 80 Feet Assistive device: Rolling walker (2 wheels) Gait Pattern/deviations: Step-to pattern, Decreased stride length, Decreased stance time - left Gait velocity: decreased     General Gait Details: CGA for safety.  Pt able to tolerate improve gait speed as she ambulated further distance.   Stairs             Wheelchair Mobility     Tilt Bed    Modified Rankin (Stroke Patients Only)       Balance Overall balance assessment: Needs assistance Sitting-balance support: No upper extremity supported, Feet supported, Single extremity supported Sitting balance-Leahy Scale: Good     Standing balance support: Bilateral upper extremity supported Standing balance-Leahy Scale: Fair                              Cognition Arousal: Alert Behavior During Therapy: Anxious, WFL for tasks assessed/performed Overall Cognitive Status: Within Functional Limits for tasks assessed                                          Exercises      General Comments        Pertinent Vitals/Pain Pain Assessment Pain Assessment: Faces Faces Pain Scale: Hurts a little  bit Pain Location: medial knee Pain Descriptors / Indicators: Aching Pain Intervention(s): Limited activity within patient's tolerance, Monitored during session, Premedicated before session    Home Living                          Prior Function            PT Goals (current goals can now be found in the care plan section) Acute Rehab PT Goals Patient Stated Goal: to get better PT Goal Formulation: With patient Time For Goal Achievement: 05/14/23 Potential to Achieve Goals: Good Progress towards PT goals: Progressing toward goals    Frequency    Min 1X/week      PT Plan       Co-evaluation              AM-PAC PT "6 Clicks" Mobility   Outcome Measure  Help needed turning from your back to your side while in a flat bed without using bedrails?: A Little Help needed moving from lying on your back to sitting on the side of a flat bed without using bedrails?: A Little Help needed moving to and from a bed to a chair (including a wheelchair)?: A Little Help needed standing up from a chair using your arms (e.g., wheelchair or bedside chair)?: A Little Help needed to walk in hospital room?: A Little Help needed climbing 3-5 steps with a railing? : A Lot 6 Click Score: 17    End of Session Equipment Utilized During Treatment: Gait belt Activity Tolerance: Patient tolerated treatment well Patient left: in bed;with call bell/phone within reach Nurse Communication: Mobility status PT Visit Diagnosis: Unsteadiness on feet (R26.81);Muscle weakness (generalized) (M62.81);Difficulty in walking, not elsewhere classified (R26.2)     Time: 4166-0630 PT Time Calculation (min) (ACUTE ONLY): 26 min  Charges:    $Therapeutic Activity: 8-22 mins PT General Charges $$ ACUTE PT VISIT: 1 Visit                     Nolon Bussing, PT, DPT Physical Therapist - [facility]  05/02/23, 2:01 PM

## 2023-05-02 NOTE — Progress Notes (Signed)
PHARMACY - ANTICOAGULATION CONSULT NOTE  Pharmacy Consult for Heparin Infusion Indication:  Limb ischemia status post endovascular intervention  Patient Measurements: Height: 5\' 7"  (170.2 cm) Weight: 61.2 kg (134 lb 14.7 oz) IBW/kg (Calculated) : 61.6 Heparin Dosing Weight: 61.2 kg  Labs: Recent Labs    04/29/23 0415 04/30/23 0114 04/30/23 0245 04/30/23 0729 05/01/23 0523 05/02/23 0109  HGB 13.7 12.1 11.7*  --   --   --   HCT 39.1 35.8* 33.3*  --   --   --   PLT 226 194 203  --   --   --   HEPARINUNFRC 0.36 <0.10*  --  <0.10* <0.10* <0.10*  CREATININE  --  0.71  --   --   --   --     Estimated Creatinine Clearance: 68.6 mL/min (by C-G formula based on SCr of 0.71 mg/dL).  Medical History: Past Medical History:  Diagnosis Date   Absence of kidney    left   Anxiety    Arthritis    Bladder cancer (HCC)    CHF (congestive heart failure) (HCC)    Complication of anesthesia    BP HAS  RUN LOW AFTER SURGERY-LUNGS FILLED UP WITH FLUID AFTER  LEG STENT SURGERY    Coronary artery disease    Diabetes mellitus    Family history of adverse reaction to anesthesia    Sister - PONV   GERD (gastroesophageal reflux disease)    OCC TUMS   Heart murmur    Hemorrhoid    History of methicillin resistant staphylococcus aureus (MRSA) 2007   Hypertension    Neuropathy    PVD (peripheral vascular disease) (HCC)    Thyroid nodule    right   Urothelial carcinoma of kidney (HCC) 10/31/2014   INVASIVE UROTHELIAL CARCINOMA, LOW GRADE. T1, Nx.   Wears dentures    full upper and lower   Assessment: Patient is a 62yo female with left lower extremity ischemia, she is s/p mechanical thrombectomy. Pharmacy consulted for heparin Infusion.  Per discussion with vascular team, plan is to restart heparin at 600 units/hr and titrate to goal of 0.2 - 0.5, no bolus protocol. Heparin to stop at 10 am on 10/26 after administration of Plavix.  Goal of Therapy:  Heparin level 0.2 - 0.5 units/ml (NO  BOLUS) Monitor platelets by anticoagulation protocol: Yes   Plan:  10/26:  HL @ 0109 = < 0.10, SUBtherapeutic - Will increase heparin drip rate to 700 units/hr, no bolus - Will recheck HL 6 hrs after rate change ---Stop heparin infusion at 10 am on 10/26 --CBC tomorrow AM  [name] D 05/02/2023

## 2023-05-03 ENCOUNTER — Inpatient Hospital Stay: Payer: 59

## 2023-05-03 LAB — CBC
HCT: 30.3 % — ABNORMAL LOW (ref 36.0–46.0)
HCT: 28.6 % — ABNORMAL LOW (ref 36.0–46.0)
Hemoglobin: 10.7 g/dL — ABNORMAL LOW (ref 12.0–15.0)
Hemoglobin: 10 g/dL — ABNORMAL LOW (ref 12.0–15.0)
MCH: 30 pg (ref 26.0–34.0)
MCH: 30.4 pg (ref 26.0–34.0)
MCHC: 35.3 g/dL (ref 30.0–36.0)
MCHC: 35 g/dL (ref 30.0–36.0)
MCV: 84.9 fL (ref 80.0–100.0)
MCV: 86.9 fL (ref 80.0–100.0)
Platelets: 258 10*3/uL (ref 150–400)
Platelets: 280 10*3/uL (ref 150–400)
RBC: 3.57 MIL/uL — ABNORMAL LOW (ref 3.87–5.11)
RBC: 3.29 MIL/uL — ABNORMAL LOW (ref 3.87–5.11)
RDW: 13.4 % (ref 11.5–15.5)
RDW: 13.6 % (ref 11.5–15.5)
WBC: 13.9 10*3/uL — ABNORMAL HIGH (ref 4.0–10.5)
WBC: 13.6 10*3/uL — ABNORMAL HIGH (ref 4.0–10.5)
nRBC: 0 % (ref 0.0–0.2)
nRBC: 0 % (ref 0.0–0.2)

## 2023-05-03 LAB — COMPREHENSIVE METABOLIC PANEL
ALT: 11 U/L (ref 0–44)
AST: 12 U/L — ABNORMAL LOW (ref 15–41)
Albumin: 2.4 g/dL — ABNORMAL LOW (ref 3.5–5.0)
Alkaline Phosphatase: 57 U/L (ref 38–126)
Anion gap: 7 (ref 5–15)
BUN: 28 mg/dL — ABNORMAL HIGH (ref 8–23)
CO2: 23 mmol/L (ref 22–32)
Calcium: 8.3 mg/dL — ABNORMAL LOW (ref 8.9–10.3)
Chloride: 107 mmol/L (ref 98–111)
Creatinine, Ser: 0.73 mg/dL (ref 0.44–1.00)
GFR, Estimated: >60 mL/min (ref >60)
Glucose, Bld: 248 mg/dL — ABNORMAL HIGH (ref 70–99)
Potassium: 3.3 mmol/L — ABNORMAL LOW (ref 3.5–5.1)
Sodium: 137 mmol/L (ref 135–145)
Total Bilirubin: 0.8 mg/dL (ref 0.3–1.2)
Total Protein: 5.3 g/dL — ABNORMAL LOW (ref 6.5–8.1)

## 2023-05-03 LAB — GLUCOSE, CAPILLARY
Glucose-Capillary: 237 mg/dL — ABNORMAL HIGH (ref 70–99)
Glucose-Capillary: 333 mg/dL — ABNORMAL HIGH (ref 70–99)
Glucose-Capillary: 243 mg/dL — ABNORMAL HIGH (ref 70–99)
Glucose-Capillary: 225 mg/dL — ABNORMAL HIGH (ref 70–99)

## 2023-05-03 LAB — TROPONIN I (HIGH SENSITIVITY): Troponin I (High Sensitivity): 39 ng/L — ABNORMAL HIGH (ref <18)

## 2023-05-03 MED ORDER — POTASSIUM CHLORIDE 20 MEQ PO PACK
40.0000 meq | PACK | Freq: Once | ORAL | Status: AC
  Administered 2023-05-03: 40 meq via ORAL
  Filled 2023-05-03: qty 2

## 2023-05-03 NOTE — Progress Notes (Signed)
Pt has orders to transfer to Medsurg. Report called to Zoia RN, pt transferred via wheelchair by this RN. All belongings sent w/ patient. Sister Jasmine December Dent notified.

## 2023-05-03 NOTE — Progress Notes (Signed)
2 Days Post-Op   Subjective/Chief Complaint: States her foot feels better. Otherwise she states she feels fatigued.   Objective: Vital signs in last 24 hours: Temp:  [98.2 F (36.8 C)-99.5 F (37.5 C)] 98.2 F (36.8 C) (10/27 0800) Pulse Rate:  [25-82] 71 (10/27 0800) Resp:  [11-22] 15 (10/27 0800) BP: (102-172)/(44-83) 165/44 (10/27 0700) SpO2:  [95 %-100 %] 98 % (10/27 0800) Last BM Date : 05/03/23  Intake/Output from previous day: 10/26 0701 - 10/27 0700 In: 35.4 [I.V.:35.4] Out: 300 [Urine:300] Intake/Output this shift: No intake/output data recorded.  General appearance: alert and no distress Resp: clear to auscultation bilaterally Cardio: regular rate and rhythm Extremities: RIGHT groin access site-soft, no hematoma, LEFT lower extremity- Prevena in place with good seal, lower leg incision- dressing with serosang staining, foot warm, palpable DP, +PT, 5th toe gangrene  Lab Results:  Recent Labs    05/02/23 0319 05/03/23 0540  WBC 12.5* 13.9*  HGB 10.9* 10.7*  HCT 31.8* 30.3*  PLT 225 258   BMET No results for input(s): "NA", "K", "CL", "CO2", "GLUCOSE", "BUN", "CREATININE", "CALCIUM" in the last 72 hours. PT/INR No results for input(s): "LABPROT", "INR" in the last 72 hours. ABG No results for input(s): "PHART", "HCO3" in the last 72 hours.  Invalid input(s): "PCO2", "PO2"  Studies/Results: PERIPHERAL VASCULAR CATHETERIZATION  Result Date: 05/01/2023 See surgical note for result.   Anti-infectives: Anti-infectives (From admission, onward)    Start     Dose/Rate Route Frequency Ordered Stop   05/01/23 0600  ceFAZolin (ANCEF) IVPB 2g/100 mL premix        2 g 200 mL/hr over 30 Minutes Intravenous On call to O.R. 04/30/23 2207 05/01/23 1734   04/29/23 2130  ceFAZolin (ANCEF) IVPB 2g/100 mL premix        2 g 200 mL/hr over 30 Minutes Intravenous Every 8 hours 04/29/23 1802 04/30/23 0600   04/29/23 1658  gentamicin (GARAMYCIN) injection  Status:   Discontinued          As needed 04/29/23 1658 04/29/23 1727   04/29/23 1658  vancomycin (VANCOCIN) powder  Status:  Discontinued          As needed 04/29/23 1659 04/29/23 1727   04/29/23 0730  ceFAZolin (ANCEF) IVPB 2g/100 mL premix        2 g 200 mL/hr over 30 Minutes Intravenous On call to O.R. 04/29/23 1025 04/29/23 1350   04/24/23 2100  ceFAZolin (ANCEF) IVPB 2g/100 mL premix        2 g 200 mL/hr over 30 Minutes Intravenous Every 8 hours 04/24/23 1701 04/25/23 0701   04/24/23 1107  ceFAZolin (ANCEF) IVPB 2g/100 mL premix        2 g 200 mL/hr over 30 Minutes Intravenous 30 min pre-op 04/24/23 1107 04/24/23 1729       Assessment/Plan: S/P LEFT Fem- Distal bypass and subsequent percutaneous thrombectomy, angioplasty and stent  ASA/Plavix, Eliquis Podiatry plan for toe amputation tomorrow PT- OOB as tolerated.  LOS: 9 days    Eli Hose A 05/03/2023

## 2023-05-04 ENCOUNTER — Other Ambulatory Visit (HOSPITAL_COMMUNITY): Payer: Self-pay

## 2023-05-04 ENCOUNTER — Ambulatory Visit (INDEPENDENT_AMBULATORY_CARE_PROVIDER_SITE_OTHER): Payer: 59 | Admitting: Vascular Surgery

## 2023-05-04 ENCOUNTER — Encounter: Payer: Self-pay | Admitting: Vascular Surgery

## 2023-05-04 LAB — COMPREHENSIVE METABOLIC PANEL
ALT: 13 U/L (ref 0–44)
AST: 15 U/L (ref 15–41)
Albumin: 2.5 g/dL — ABNORMAL LOW (ref 3.5–5.0)
Alkaline Phosphatase: 55 U/L (ref 38–126)
Anion gap: 9 (ref 5–15)
BUN: 26 mg/dL — ABNORMAL HIGH (ref 8–23)
CO2: 21 mmol/L — ABNORMAL LOW (ref 22–32)
Calcium: 8.6 mg/dL — ABNORMAL LOW (ref 8.9–10.3)
Chloride: 109 mmol/L (ref 98–111)
Creatinine, Ser: 0.67 mg/dL (ref 0.44–1.00)
GFR, Estimated: >60 mL/min (ref >60)
Glucose, Bld: 306 mg/dL — ABNORMAL HIGH (ref 70–99)
Potassium: 4.3 mmol/L (ref 3.5–5.1)
Sodium: 139 mmol/L (ref 135–145)
Total Bilirubin: 0.9 mg/dL (ref 0.3–1.2)
Total Protein: 5.5 g/dL — ABNORMAL LOW (ref 6.5–8.1)

## 2023-05-04 LAB — GLUCOSE, CAPILLARY
Glucose-Capillary: 292 mg/dL — ABNORMAL HIGH (ref 70–99)
Glucose-Capillary: 245 mg/dL — ABNORMAL HIGH (ref 70–99)
Glucose-Capillary: 210 mg/dL — ABNORMAL HIGH (ref 70–99)
Glucose-Capillary: 266 mg/dL — ABNORMAL HIGH (ref 70–99)

## 2023-05-04 LAB — TROPONIN I (HIGH SENSITIVITY): Troponin I (High Sensitivity): 42 ng/L — ABNORMAL HIGH (ref <18)

## 2023-05-04 MED ORDER — CEFAZOLIN SODIUM-DEXTROSE 2-4 GM/100ML-% IV SOLN
2.0000 g | INTRAVENOUS | Status: AC
  Administered 2023-05-05: 2 g via INTRAVENOUS

## 2023-05-04 MED ORDER — CHLORHEXIDINE GLUCONATE 4 % EX SOLN: 60.0000 mL | Freq: Once | CUTANEOUS | Status: DC

## 2023-05-04 MED ORDER — POVIDONE-IODINE 10 % EX SWAB: 2.0000 | Freq: Once | CUTANEOUS | Status: DC

## 2023-05-04 NOTE — Progress Notes (Signed)
As per Dr. Evie Lacks, still giving aspirin and clopidigrel and aspirin this morning. Sips with meds.

## 2023-05-04 NOTE — Progress Notes (Signed)
Recheck of vital signs. VSS, patient remains asymptomatic.

## 2023-05-04 NOTE — Progress Notes (Signed)
Progress Note    05/04/2023 12:50 PM 3 Days Post-Op  Subjective:  Laura Reed is a 62 yo female now POD #3 from  Percutaneous transluminal angioplasty and stent placement left common femoral artery, left anterior tibial artery with Mechanical thrombectomy of the distal common femoral artery and femoral distal bypass graft. Patient is resting comfortably in bed. No complaints overnight and vitals all remain stable. Patient seen by Dr Alberteen Spindle from podiatry. Patient scheduled for surgery tomorrow. Patient verbalized her understanding. Patients diet changed to let her eat today but will be NPO after midnight tonight.    Vitals:   05/04/23 0344 05/04/23 0439  BP: (!) 108/38 (!) 131/49  Pulse:    Resp:    Temp: 98.9 F (37.2 C)   SpO2: 97%    Physical Exam: Cardiac:  RRR, No murmurs to note. Lungs:  Clear throughout on auscultation. No rales rhonchi or wheezing to note. Incisions:  Right groin incision with dressing clean dry and intact.  Extremities:  Left lower extremity remains warm to touch and palpable DP pulse.  Abdomen:  Positive bowel sounds throughout, soft, non tender and non distended Neurologic: AAOX3, Answers questions and follows commands appropriately.   CBC    Component Value Date/Time   WBC 13.6 (H) 05/03/2023 2222   RBC 3.29 (L) 05/03/2023 2222   HGB 10.0 (L) 05/03/2023 2222   HGB 9.5 (L) 11/02/2014 0609   HCT 28.6 (L) 05/03/2023 2222   HCT 29.5 (L) 11/02/2014 0609   PLT 280 05/03/2023 2222   PLT 217 11/02/2014 0609   MCV 86.9 05/03/2023 2222   MCV 87 11/02/2014 0609   MCH 30.4 05/03/2023 2222   MCHC 35.0 05/03/2023 2222   RDW 13.6 05/03/2023 2222   RDW 13.6 11/02/2014 0609   LYMPHSABS 1.3 09/08/2022 1044   LYMPHSABS 1.2 11/02/2014 0609   MONOABS 0.7 09/08/2022 1044   MONOABS 1.1 (H) 11/02/2014 0609   EOSABS 0.1 09/08/2022 1044   EOSABS 0.3 11/02/2014 0609   BASOSABS 0.1 09/08/2022 1044   BASOSABS 0.0 11/02/2014 0609    BMET    Component Value  Date/Time   NA 139 05/04/2023 0558   NA 136 08/02/2021 1629   NA 135 11/02/2014 0609   K 4.3 05/04/2023 0558   K 4.2 11/02/2014 0609   CL 109 05/04/2023 0558   CL 107 11/02/2014 0609   CO2 21 (L) 05/04/2023 0558   CO2 23 11/02/2014 0609   GLUCOSE 306 (H) 05/04/2023 0558   GLUCOSE 108 (H) 11/02/2014 0609   BUN 26 (H) 05/04/2023 0558   BUN 25 08/02/2021 1629   BUN 20 11/02/2014 0609   CREATININE 0.67 05/04/2023 0558   CREATININE 1.01 11/09/2015 1549   CREATININE 1.01 11/09/2015 1549   CALCIUM 8.6 (L) 05/04/2023 0558   CALCIUM 7.3 (L) 11/02/2014 0609   GFRNONAA >60 05/04/2023 0558   GFRNONAA 50 (L) 11/02/2014 0609   GFRAA >60 01/19/2019 0357   GFRAA 58 (L) 11/02/2014 0609    INR    Component Value Date/Time   INR 0.9 10/17/2014 1131     Intake/Output Summary (Last 24 hours) at 05/04/2023 1250 Last data filed at 05/04/2023 0600 Gross per 24 hour  Intake --  Output 1 ml  Net -1 ml     Assessment/Plan:  62 y.o. female is s/p LEFT Fem- Distal bypass and subsequent percutaneous thrombectomy, angioplasty and stent placement.  3 Days Post-Op   PLAN: Patient scheduled for surgery tomorrow with Podiatry. NPO after midnight. Pain Medication  as needed.  Okay to eat today.  OOB to chair today.   DVT prophylaxis:  ASA 81 mg, Plavix 75 mg, Eliquis 2.5 mg  BID   Peggye Ley [facility] 05/04/2023 12:50 PM

## 2023-05-04 NOTE — TOC Benefit Eligibility Note (Signed)
Patient Product/process development scientist completed.    The patient is insured through [facility]. Patient has Medicare and is not eligible for a copay card, but may be able to apply for patient assistance, if available.    Ran test claim for Eliquis 2.5 mg and the current 30 day co-pay is $0.00.   This test claim was processed through [facility]- copay amounts may vary at other pharmacies due to pharmacy/plan contracts, or as the patient moves through the different stages of their insurance plan.     Roland Earl, CPHT Pharmacy Technician III Certified Patient Advocate [facility] Patient Advocate Team Direct Number: [phone]  Fax: [phone]

## 2023-05-04 NOTE — Progress Notes (Signed)
PT Cancellation Note  Patient Details Name: Laura Reed MRN: [id number] DOB: Apr 26, 1961   Cancelled Treatment:    Reason Eval/Treat Not Completed: Other (comment). Pt refusing PT at this time due to discomfort. Attempted to encourage Pt to mobilize prior to surgery tomorrow but not successful. Will attempt PT visit at a later time.   Wenceslaus Gist 05/04/2023, 3:31 PM

## 2023-05-04 NOTE — Plan of Care (Signed)
  Problem: Education: Goal: Knowledge of General Education information will improve Description: Including pain rating scale, medication(s)/side effects and non-pharmacologic comfort measures Outcome: Progressing   Problem: Health Behavior/Discharge Planning: Goal: Ability to manage health-related needs will improve Outcome: Progressing   Problem: Clinical Measurements: Goal: Ability to maintain clinical measurements within normal limits will improve Outcome: Progressing Goal: Will remain free from infection Outcome: Progressing Goal: Diagnostic test results will improve Outcome: Progressing Goal: Cardiovascular complication will be avoided Outcome: Progressing   Problem: Activity: Goal: Risk for activity intolerance will decrease Outcome: Progressing   Problem: Nutrition: Goal: Adequate nutrition will be maintained Outcome: Progressing   Problem: Pain Managment: Goal: General experience of comfort will improve Outcome: Progressing   Problem: Safety: Goal: Ability to remain free from injury will improve Outcome: Progressing   Problem: Coping: Goal: Ability to adjust to condition or change in health will improve Outcome: Progressing   Problem: Fluid Volume: Goal: Ability to maintain a balanced intake and output will improve Outcome: Progressing   Problem: Health Behavior/Discharge Planning: Goal: Ability to identify and utilize available resources and services will improve Outcome: Progressing   Problem: Metabolic: Goal: Ability to maintain appropriate glucose levels will improve Outcome: Progressing   Problem: Skin Integrity: Goal: Risk for impaired skin integrity will decrease Outcome: Progressing   Problem: Tissue Perfusion: Goal: Adequacy of tissue perfusion will improve Outcome: Progressing

## 2023-05-04 NOTE — Progress Notes (Signed)
3 Days Post-Op   Subjective/Chief Complaint: Patient seen.  States that her circulation is better now.  Has had some drainage from her left foot.   Objective: Vital signs in last 24 hours: Temp:  [98.4 F (36.9 C)-98.9 F (37.2 C)] 98.9 F (37.2 C) (10/28 0344) Pulse Rate:  [63-69] 63 (10/27 2315) Resp:  [16-17] 16 (10/27 2315) BP: (108-148)/(38-67) 131/49 (10/28 0439) SpO2:  [97 %-100 %] 97 % (10/28 0344) Last BM Date : 05/03/23  Intake/Output from previous day: 10/27 0701 - 10/28 0700 In: 240 [P.O.:240] Out: 1 [Urine:1] Intake/Output this shift: No intake/output data recorded.  The left foot is warm with palpable pulses but diminished.  Gangrenous changes noted in the left fifth toe and fourth interspace mildly extending onto the forefoot.  Multiple previous amputations on the left foot     Lab Results:  Recent Labs    05/03/23 0540 05/03/23 2222  WBC 13.9* 13.6*  HGB 10.7* 10.0*  HCT 30.3* 28.6*  PLT 258 280   BMET Recent Labs    05/03/23 2222 05/04/23 0558  NA 137 139  K 3.3* 4.3  CL 107 109  CO2 23 21*  GLUCOSE 248* 306*  BUN 28* 26*  CREATININE 0.73 0.67  CALCIUM 8.3* 8.6*   PT/INR No results for input(s): "LABPROT", "INR" in the last 72 hours. ABG No results for input(s): "PHART", "HCO3" in the last 72 hours.  Invalid input(s): "PCO2", "PO2"  Studies/Results: DG Chest Port 1 View  Result Date: 05/03/2023 CLINICAL DATA:  Chest pain EXAM: PORTABLE CHEST 1 VIEW COMPARISON:  07/14/2018 FINDINGS: Single frontal view of the chest demonstrates stable postsurgical changes from median sternotomy. The cardiac silhouette is unremarkable. No acute airspace disease, effusion, or pneumothorax. No acute fractures. IMPRESSION: 1. No acute intrathoracic process. Electronically Signed   By: Sharlet Salina M.D.   On: 05/03/2023 23:17    Anti-infectives: Anti-infectives (From admission, onward)    Start     Dose/Rate Route Frequency Ordered Stop   05/01/23  0600  ceFAZolin (ANCEF) IVPB 2g/100 mL premix        2 g 200 mL/hr over 30 Minutes Intravenous On call to O.R. 04/30/23 2207 05/01/23 1734   04/29/23 2130  ceFAZolin (ANCEF) IVPB 2g/100 mL premix        2 g 200 mL/hr over 30 Minutes Intravenous Every 8 hours 04/29/23 1802 04/30/23 0600   04/29/23 1658  gentamicin (GARAMYCIN) injection  Status:  Discontinued          As needed 04/29/23 1658 04/29/23 1727   04/29/23 1658  vancomycin (VANCOCIN) powder  Status:  Discontinued          As needed 04/29/23 1659 04/29/23 1727   04/29/23 0730  ceFAZolin (ANCEF) IVPB 2g/100 mL premix        2 g 200 mL/hr over 30 Minutes Intravenous On call to O.R. 04/29/23 0981 04/29/23 1350   04/24/23 2100  ceFAZolin (ANCEF) IVPB 2g/100 mL premix        2 g 200 mL/hr over 30 Minutes Intravenous Every 8 hours 04/24/23 1701 04/25/23 0701   04/24/23 1107  ceFAZolin (ANCEF) IVPB 2g/100 mL premix        2 g 200 mL/hr over 30 Minutes Intravenous 30 min pre-op 04/24/23 1107 04/24/23 1729       Assessment/Plan: s/p Procedure(s): Lower Extremity Angiography (Left) Assessment: Gangrenous changes left fourth and fifth toes..  Plan: Light gauze dressing applied.  Discussed with the patient that she most likely  is going to need a transmetatarsal amputation.  Discussed possible risks and complications of the procedure including but not limited to inability of the wound to heal due to her diabetes, infection, vascular status.  Could experience phantom pains.  No guarantees could be given as to the outcome of surgery.  Spoke with Dr. Ether Griffins who is on-call.  At this point the plan is for surgery tomorrow around noon.  Patient will be n.p.o. after midnight.  Dr. Ether Griffins to follow.  LOS: 10 days    Ricci Barker 05/04/2023

## 2023-05-04 NOTE — TOC Progression Note (Signed)
Transition of Care [facility]) - Progression Note    Patient Details  Name: Laura Reed MRN: [id number] Date of Birth: June 12, 1961  Transition of Care [facility]) CM/SW Contact  Truddie Hidden, RN Phone Number: 05/04/2023, 10:43 AM  Clinical Narrative:    Per Kandee Keen with [facility], referral accepted    Expected Discharge Plan: Home w Home Health Services    Expected Discharge Plan and Services     Post Acute Care Choice: Home Health Living arrangements for the past 2 months: Apartment                                       Social Determinants of Health (SDOH) Interventions SDOH Screenings   Food Insecurity: No Food Insecurity (04/24/2023)  Housing: Low Risk  (04/24/2023)  Transportation Needs: No Transportation Needs (04/24/2023)  Utilities: Not At Risk (04/24/2023)  Alcohol Screen: Low Risk  (09/27/2020)  Depression (PHQ2-9): Low Risk  (09/08/2022)  Financial Resource Strain: Low Risk  (06/12/2022)  Physical Activity: Inactive (06/10/2021)  Social Connections: Moderately Isolated (06/12/2022)  Stress: No Stress Concern Present (06/12/2022)  Tobacco Use: Medium Risk (05/01/2023)    Readmission Risk Interventions     No data to display

## 2023-05-04 NOTE — Inpatient Diabetes Management (Signed)
Inpatient Diabetes Program Recommendations  AACE/ADA: New Consensus Statement on Inpatient Glycemic Control (2015)  Target Ranges:  Prepandial:   less than 140 mg/dL      Peak postprandial:   less than 180 mg/dL (1-2 hours)      Critically ill patients:  140 - 180 mg/dL    Latest Reference Range & Units 05/03/23 07:52 05/03/23 11:58 05/03/23 17:34 05/03/23 21:17  Glucose-Capillary 70 - 99 mg/dL 161 (H)  5 units Novolog  333 (H)  11 units Novolog @1328  243 (H)  5 units Novolog  225 (H)  (H): Data is abnormally high     Home DM Meds: Tresiba 20 units daily      Humalog 10 units TID with meals     Jardiance 25 mg daily   Current Orders: Novolog Moderate Correction Scale/ SSI (0-15 units) TID AC    MD- Note pt takes [place] Insulin at home and also takes Humalog with meals.  CBGs significantly elevated.  Please consider:   1. Start Semglee 15 units Daily (75% home dose)  2. Start Novolog Meal Coverage: Novolog 5 units TID with meals (50% home dose) HOLD if pt NPO HOLD if pt eats <50% meals    --Will follow patient during hospitalization--  Ambrose Finland RN, MSN, CDCES Diabetes Coordinator Inpatient Glycemic Control Team Team Pager: [phone] (8a-5p)

## 2023-05-04 NOTE — Progress Notes (Signed)
Notified by CCMD that patient was in bigeminy, PVCs. Vital signs stable, pt. restless but asymptomatic. MD notified, EKG obtained. New orders placed by provider.

## 2023-05-05 ENCOUNTER — Encounter
Admission: RE | Disposition: A | Discharge status: Skilled Nursing Facility | Payer: Self-pay | Source: Home / Self Care | Attending: Vascular Surgery

## 2023-05-05 ENCOUNTER — Other Ambulatory Visit: Payer: 59 | Admitting: Urology

## 2023-05-05 ENCOUNTER — Telehealth: Payer: Self-pay | Admitting: Internal Medicine

## 2023-05-05 ENCOUNTER — Encounter: Payer: Self-pay | Admitting: Vascular Surgery

## 2023-05-05 ENCOUNTER — Inpatient Hospital Stay: Payer: 59 | Admitting: Certified Registered"

## 2023-05-05 ENCOUNTER — Inpatient Hospital Stay: Payer: 59

## 2023-05-05 ENCOUNTER — Other Ambulatory Visit: Payer: Self-pay

## 2023-05-05 DIAGNOSIS — Z794 Long term (current) use of insulin: Secondary | ICD-10-CM

## 2023-05-05 HISTORY — PX: TRANSMETATARSAL AMPUTATION: SHX6197

## 2023-05-05 LAB — GLUCOSE, CAPILLARY
Glucose-Capillary: 326 mg/dL — ABNORMAL HIGH (ref 70–99)
Glucose-Capillary: 219 mg/dL — ABNORMAL HIGH (ref 70–99)
Glucose-Capillary: 225 mg/dL — ABNORMAL HIGH (ref 70–99)
Glucose-Capillary: 222 mg/dL — ABNORMAL HIGH (ref 70–99)
Glucose-Capillary: 246 mg/dL — ABNORMAL HIGH (ref 70–99)
Glucose-Capillary: 274 mg/dL — ABNORMAL HIGH (ref 70–99)

## 2023-05-05 SURGERY — AMPUTATION, FOOT, TRANSMETATARSAL
Anesthesia: General | Site: Toe | Laterality: Left

## 2023-05-05 MED ORDER — 0.9 % SODIUM CHLORIDE (POUR BTL) OPTIME
TOPICAL | Status: DC | PRN
  Administered 2023-05-05: 500 mL

## 2023-05-05 MED ORDER — PROPOFOL 500 MG/50ML IV EMUL
INTRAVENOUS | Status: DC | PRN
  Administered 2023-05-05: 100 ug/kg/min via INTRAVENOUS

## 2023-05-05 MED ORDER — DROPERIDOL 2.5 MG/ML IJ SOLN: 0.6250 mg | Freq: Once | INTRAMUSCULAR | Status: DC | PRN

## 2023-05-05 MED ORDER — FENTANYL CITRATE (PF) 100 MCG/2ML IJ SOLN
25.0000 ug | INTRAMUSCULAR | Status: DC | PRN
  Administered 2023-05-05: 25 ug via INTRAVENOUS

## 2023-05-05 MED ORDER — FREESTYLE LIBRE 2 SENSOR MISC: 2 refills | Status: AC

## 2023-05-05 MED ORDER — BUPIVACAINE HCL (PF) 0.5 % IJ SOLN
INTRAMUSCULAR | Status: AC
  Filled 2023-05-05: qty 30

## 2023-05-05 MED ORDER — LIDOCAINE HCL (PF) 1 % IJ SOLN
INTRAMUSCULAR | Status: AC
  Filled 2023-05-05: qty 30

## 2023-05-05 MED ORDER — FENTANYL CITRATE (PF) 100 MCG/2ML IJ SOLN
INTRAMUSCULAR | Status: AC
  Filled 2023-05-05: qty 2

## 2023-05-05 MED ORDER — GLYCOPYRROLATE 0.2 MG/ML IJ SOLN
INTRAMUSCULAR | Status: DC | PRN
Start: 2023-05-05 — End: 2023-05-05
  Administered 2023-05-05: .2 mg via INTRAVENOUS

## 2023-05-05 MED ORDER — MIDAZOLAM HCL 2 MG/2ML IJ SOLN
INTRAMUSCULAR | Status: AC
  Filled 2023-05-05: qty 2

## 2023-05-05 MED ORDER — FENTANYL CITRATE (PF) 100 MCG/2ML IJ SOLN
INTRAMUSCULAR | Status: DC | PRN
  Administered 2023-05-05 (×2): 50 ug via INTRAVENOUS

## 2023-05-05 MED ORDER — PROPOFOL 10 MG/ML IV BOLUS
INTRAVENOUS | Status: AC
  Filled 2023-05-05: qty 20

## 2023-05-05 MED ORDER — CEFAZOLIN SODIUM-DEXTROSE 2-4 GM/100ML-% IV SOLN
INTRAVENOUS | Status: AC
Start: 2023-05-05 — End: ?
  Filled 2023-05-05: qty 100

## 2023-05-05 MED ORDER — PHENYLEPHRINE 80 MCG/ML (10ML) SYRINGE FOR IV PUSH (FOR BLOOD PRESSURE SUPPORT)
PREFILLED_SYRINGE | INTRAVENOUS | Status: DC | PRN
  Administered 2023-05-05 (×2): 80 ug via INTRAVENOUS

## 2023-05-05 MED ORDER — ONDANSETRON HCL 4 MG/2ML IJ SOLN
INTRAMUSCULAR | Status: DC | PRN
  Administered 2023-05-05: 4 mg via INTRAVENOUS

## 2023-05-05 MED ORDER — MORPHINE SULFATE (PF) 2 MG/ML IV SOLN: 2.0000 mg | INTRAVENOUS | Status: DC | PRN

## 2023-05-05 MED ORDER — LIDOCAINE HCL 1 % IJ SOLN
INTRAMUSCULAR | Status: DC | PRN
  Administered 2023-05-05: 10 mL

## 2023-05-05 MED ORDER — MIDAZOLAM HCL 2 MG/2ML IJ SOLN
INTRAMUSCULAR | Status: DC | PRN
  Administered 2023-05-05: 2 mg via INTRAVENOUS

## 2023-05-05 MED ORDER — BUPIVACAINE HCL (PF) 0.5 % IJ SOLN
INTRAMUSCULAR | Status: DC | PRN
  Administered 2023-05-05: 10 mL

## 2023-05-05 MED ORDER — EPHEDRINE SULFATE-NACL 50-0.9 MG/10ML-% IV SOSY
PREFILLED_SYRINGE | INTRAVENOUS | Status: DC | PRN
  Administered 2023-05-05 (×2): 5 mg via INTRAVENOUS

## 2023-05-05 SURGICAL SUPPLY — 43 items
BLADE OSC/SAGITTAL 5.5X25 (BLADE) ×1 IMPLANT
BLADE SURG SZ10 CARB STEEL (BLADE) IMPLANT
BNDG CMPR 5X4 CHSV STRCH STRL (GAUZE/BANDAGES/DRESSINGS) ×1
BNDG CMPR 75X41 PLY HI ABS (GAUZE/BANDAGES/DRESSINGS)
BNDG CMPR STD VLCR NS LF 5.8X4 (GAUZE/BANDAGES/DRESSINGS)
BNDG COHESIVE 4X5 TAN STRL LF (GAUZE/BANDAGES/DRESSINGS) ×1 IMPLANT
BNDG ELASTIC 4X5.8 VLCR NS LF (GAUZE/BANDAGES/DRESSINGS) ×1 IMPLANT
BNDG ESMARCH 4 X 12 STRL LF (GAUZE/BANDAGES/DRESSINGS) ×1
BNDG ESMARCH 4X12 STRL LF (GAUZE/BANDAGES/DRESSINGS) ×1 IMPLANT
BNDG GAUZE DERMACEA FLUFF 4 (GAUZE/BANDAGES/DRESSINGS) ×1 IMPLANT
BNDG GZE DERMACEA 4 6PLY (GAUZE/BANDAGES/DRESSINGS) ×1
BNDG STRETCH 4X75 STRL LF (GAUZE/BANDAGES/DRESSINGS) ×1 IMPLANT
CUFF TOURN SGL QUICK 18X4 (TOURNIQUET CUFF) IMPLANT
DRAPE FLUOR MINI C-ARM 54X84 (DRAPES) IMPLANT
ELECT REM PT RETURN 9FT ADLT (ELECTROSURGICAL) ×1
ELECTRODE REM PT RTRN 9FT ADLT (ELECTROSURGICAL) ×1 IMPLANT
GAUZE SPONGE 4X4 12PLY STRL (GAUZE/BANDAGES/DRESSINGS) ×2 IMPLANT
GAUZE XEROFORM 1X8 LF (GAUZE/BANDAGES/DRESSINGS) ×1 IMPLANT
GLOVE BIO SURGEON STRL SZ7.5 (GLOVE) ×1 IMPLANT
GLOVE INDICATOR 8.0 STRL GRN (GLOVE) ×1 IMPLANT
GOWN STRL REUS W/ TWL XL LVL3 (GOWN DISPOSABLE) ×1 IMPLANT
GOWN STRL REUS W/TWL XL LVL3 (GOWN DISPOSABLE) ×1
GOWN STRL REUS W/TWL XL LVL4 (GOWN DISPOSABLE) ×1 IMPLANT
HANDLE YANKAUER SUCT BULB TIP (MISCELLANEOUS) IMPLANT
KIT TURNOVER KIT A (KITS) ×1 IMPLANT
MANIFOLD NEPTUNE II (INSTRUMENTS) ×1 IMPLANT
NDL HYPO 25X1 1.5 SAFETY (NEEDLE) ×1 IMPLANT
NDL SAFETY ECLIPSE 18X1.5 (NEEDLE) ×1 IMPLANT
NEEDLE HYPO 25X1 1.5 SAFETY (NEEDLE) ×2 IMPLANT
NS IRRIG 500ML POUR BTL (IV SOLUTION) ×1 IMPLANT
PACK EXTREMITY ARMC (MISCELLANEOUS) ×1 IMPLANT
PAD ABD DERMACEA PRESS 5X9 (GAUZE/BANDAGES/DRESSINGS) ×2 IMPLANT
SOL PREP PVP 2OZ (MISCELLANEOUS) ×2
SOLUTION PREP PVP 2OZ (MISCELLANEOUS) ×1 IMPLANT
STOCKINETTE M/LG 89821 (MISCELLANEOUS) ×1 IMPLANT
SUT ETHILON 3-0 FS-10 30 BLK (SUTURE) ×2
SUT VIC AB 3-0 SH 27 (SUTURE) ×1
SUT VIC AB 3-0 SH 27X BRD (SUTURE) IMPLANT
SUTURE EHLN 3-0 FS-10 30 BLK (SUTURE) ×1 IMPLANT
SWAB CULTURE AMIES ANAERIB BLU (MISCELLANEOUS) IMPLANT
SYR 10ML LL (SYRINGE) ×2 IMPLANT
TRAP FLUID SMOKE EVACUATOR (MISCELLANEOUS) ×1 IMPLANT
WATER STERILE IRR 500ML POUR (IV SOLUTION) ×1 IMPLANT

## 2023-05-05 NOTE — Progress Notes (Signed)
Patient telemetry reading Afib with heart rate 110-138. Patient in bed, no complaints upon assessment. Medicated as per PRN orders. Vital signs assessed. Patient rate remained unchanged after medication. Page/Call sent out to covering team. Recommendations to medicate again as per PRN orders. Patient remains in bed, asymptomatic. Heart rate at this time Afib 110's. Will continue to monitor closely.

## 2023-05-05 NOTE — Plan of Care (Signed)
  Problem: Education: Goal: Knowledge of General Education information will improve Description: Including pain rating scale, medication(s)/side effects and non-pharmacologic comfort measures Outcome: Progressing   Problem: Clinical Measurements: Goal: Respiratory complications will improve Outcome: Progressing Goal: Cardiovascular complication will be avoided Outcome: Progressing   Problem: Coping: Goal: Level of anxiety will decrease Outcome: Progressing   Problem: Pain Managment: Goal: General experience of comfort will improve Outcome: Progressing

## 2023-05-05 NOTE — Inpatient Diabetes Management (Signed)
Inpatient Diabetes Program Recommendations  AACE/ADA: New Consensus Statement on Inpatient Glycemic Control (2015)  Target Ranges:  Prepandial:   less than 140 mg/dL      Peak postprandial:   less than 180 mg/dL (1-2 hours)      Critically ill patients:  140 - 180 mg/dL    Latest Reference Range & Units 05/04/23 08:37 05/04/23 11:28 05/04/23 17:13 05/04/23 21:21  Glucose-Capillary 70 - 99 mg/dL 161 (H)  8 units Novolog   245 (H)  5 units Novolog  210 (H)  5 units Novolog  266 (H)  (H): Data is abnormally high  Latest Reference Range & Units 05/05/23 09:12  Glucose-Capillary 70 - 99 mg/dL 096 (H)  (H): Data is abnormally high   Home DM Meds: Tresiba 20 units daily      Humalog 10 units TID with meals     Jardiance 25 mg daily    Current Orders: Novolog Moderate Correction Scale/ SSI (0-15 units) TID AC       MD- Note pt takes [place] Insulin at home and also takes Humalog with meals.  CBGs significantly elevated.  Note pt NPO for Surgery today.    Once pt resumes PO diet, please consider:   1. Start Semglee 15 units Daily (75% home dose)   2. Start Novolog Meal Coverage: Novolog 5 units TID with meals (50% home dose) HOLD if pt NPO HOLD if pt eats <50% meals    --Will follow patient during hospitalization--  Ambrose Finland RN, MSN, CDCES Diabetes Coordinator Inpatient Glycemic Control Team Team Pager: [phone] (8a-5p)

## 2023-05-05 NOTE — Telephone Encounter (Signed)
Prescription Request  05/05/2023  LOV: 03/04/2023  What is the name of the medication or equipment? Libre 2 senor  Have you contacted your pharmacy to request a refill? No   Which pharmacy would you like this sent to?  [id number] [place], [place] - [address] AT [facility] OF [address] [place] [address] Phone: [phone] Fax: [phone]    Patient notified that their request is being sent to the clinical staff for review and that they should receive a response within 2 business days.   Please advise at Mobile [phone] (mobile)

## 2023-05-05 NOTE — Anesthesia Preprocedure Evaluation (Signed)
Anesthesia Evaluation  Patient identified by MRN, date of birth, ID band Patient awake    Reviewed: Allergy & Precautions, NPO status , Patient's Chart, lab work & pertinent test results  History of Anesthesia Complications Negative for: history of anesthetic complications  Airway Mallampati: III  TM Distance: >3 FB Neck ROM: full    Dental  (+) Upper Dentures, Lower Dentures, Dental Advidsory Given   Pulmonary former smoker   Pulmonary exam normal        Cardiovascular hypertension, On Medications (-) angina + CAD, + CABG, + Peripheral Vascular Disease and +CHF  (-) Cardiac Stents Normal cardiovascular exam(-) dysrhythmias + Valvular Problems/Murmurs      Neuro/Psych neg Seizures PSYCHIATRIC DISORDERS Anxiety      Neuromuscular disease    GI/Hepatic Neg liver ROS,GERD  Controlled,,  Endo/Other  diabetes, Insulin Dependent    Renal/GU Renal disease     Musculoskeletal  (+) Arthritis ,    Abdominal   Peds  Hematology negative hematology ROS (+)   Anesthesia Other Findings Past Medical History: No date: Absence of kidney     Comment:  left No date: Anxiety No date: Arthritis No date: Bladder cancer (HCC) No date: CHF (congestive heart failure) (HCC) No date: Complication of anesthesia     Comment:  BP HAS  RUN LOW AFTER SURGERY-LUNGS FILLED UP WITH FLUID              AFTER  LEG STENT SURGERY  No date: Coronary artery disease No date: Diabetes mellitus No date: Family history of adverse reaction to anesthesia     Comment:  Sister - PONV No date: GERD (gastroesophageal reflux disease)     Comment:  OCC TUMS No date: Heart murmur No date: Hemorrhoid 2007: History of methicillin resistant staphylococcus aureus (MRSA) No date: Hypertension No date: Neuropathy No date: PVD (peripheral vascular disease) (HCC) No date: Thyroid nodule     Comment:  right 10/31/2014: Urothelial carcinoma of kidney (HCC)      Comment:  INVASIVE UROTHELIAL CARCINOMA, LOW GRADE. T1, Nx. No date: Wears dentures     Comment:  full upper and lower  Past Surgical History: No date: AMPUTATION TOE     Comment:  right (4th and 5th); left (great toe, 3rd) 07/16/2018: AMPUTATION TOE; Right     Comment:  Procedure: AMPUTATION TOE/MPJ right 2nd;  Surgeon:               Linus Galas, DPM;  Location: [facility];  Service:               Podiatry;  Laterality: Right; 2009, 2013 x 2: ARTERIAL BYPASS SURGRY     Comment:  right leg , done in [place] No date: CARDIAC CATHETERIZATION 01/2014: CAROTID ENDARTERECTOMY; Right     Comment:  Dr Gilda Crease 12/14/2014: CATARACT EXTRACTION W/PHACO; Right     Comment:  Procedure: CATARACT EXTRACTION PHACO AND INTRAOCULAR               LENS PLACEMENT (IOC);  Surgeon: Lia Hopping, MD;                Location: [facility];  Service: Ophthalmology;  Laterality:              Right;  [place]   00:38.6              AP        7.1  CDE  2.76 12/06/2019: CATARACT EXTRACTION W/PHACO; Left     Comment:  Procedure: CATARACT EXTRACTION PHACO AND INTRAOCULAR               LENS PLACEMENT (IOC) LEFT DIABETIC;  Surgeon: Galen Manila, MD;  Location: [facility];  Service:               Ophthalmology;  Laterality: Left;  9.08 1:06.4 No date: CESAREAN SECTION 03-03-12: CHOLECYSTECTOMY     Comment:  Porcelain gallbladder, gallstones,  [place] 04/28/2012: COLONOSCOPY W/ BIOPSIES     Comment:  Hyperplastic rectal polyps. 04/02/2022: COLONOSCOPY WITH PROPOFOL; N/A     Comment:  Procedure: COLONOSCOPY WITH PROPOFOL;  Surgeon: Earline Mayotte, MD;  Location: [facility];  Service:               Endoscopy;  Laterality: N/A; 2009: CORONARY ARTERY BYPASS GRAFT     Comment:  3 vessel 09/01/2016: CYSTOSCOPY W/ RETROGRADES; Right     Comment:  Procedure: CYSTOSCOPY WITH RETROGRADE PYELOGRAM;                Surgeon: Vanna Scotland, MD;  Location: [facility];                 Service: Urology;  Laterality: Right; 03/19/2020: CYSTOSCOPY W/ RETROGRADES; Bilateral     Comment:  Procedure: CYSTOSCOPY WITH RETROGRADE PYELOGRAM;                Surgeon: Vanna Scotland, MD;  Location: [facility];                Service: Urology;  Laterality: Bilateral; 03/19/2020: CYSTOSCOPY WITH BIOPSY; N/A     Comment:  Procedure: CYSTOSCOPY WITH BIOPSY;  Surgeon: Vanna Scotland, MD;  Location: [facility];  Service: Urology;                Laterality: N/A; No date: EYE SURGERY 10-31-14: HERNIA REPAIR     Comment:  ventral, retro-rectus atrium mesh 01/18/2019: IRRIGATION AND DEBRIDEMENT FOOT; Left     Comment:  Procedure: IRRIGATION AND DEBRIDEMENT FOOT;  Surgeon:               Linus Galas, DPM;  Location: [facility];  Service:               Podiatry;  Laterality: Left; 12/10/2016: LOWER EXTREMITY ANGIOGRAPHY; Left     Comment:  Procedure: Lower Extremity Angiography;  Surgeon:               Renford Dills, MD;  Location: [facility];               Service: Cardiovascular;  Laterality: Left; 02/02/2018: LOWER EXTREMITY ANGIOGRAPHY; Left     Comment:  Procedure: LOWER EXTREMITY ANGIOGRAPHY;  Surgeon:               Renford Dills, MD;  Location: [facility];               Service: Cardiovascular;  Laterality: Left; 05/05/2018: LOWER EXTREMITY ANGIOGRAPHY; Left     Comment:  Procedure: LOWER EXTREMITY ANGIOGRAPHY;  Surgeon:               Renford Dills, MD;  Location: [facility]  [facility];               Service: Cardiovascular;  Laterality: Left; 12/04/2020: LOWER EXTREMITY ANGIOGRAPHY; Left     Comment:  Procedure: LOWER EXTREMITY ANGIOGRAPHY with               Intervention;  Surgeon: Renford Dills, MD;                Location: [facility];  Service: Cardiovascular;              Laterality: Left; 04/24/2023: LOWER EXTREMITY ANGIOGRAPHY; Left     Comment:  Procedure: Lower Extremity Angiography;  Surgeon:                Renford Dills, MD;  Location: [facility];               Service: Cardiovascular;  Laterality: Left; 10-31-14: NEPHRECTOMY; Left 05/01/2015: PERIPHERAL VASCULAR CATHETERIZATION; Left     Comment:  Procedure: Lower Extremity Angiography;  Surgeon:               Renford Dills, MD;  Location: [facility];                Service: Cardiovascular;  Laterality: Left; 05/01/2015: PERIPHERAL VASCULAR CATHETERIZATION     Comment:  Procedure: Lower Extremity Intervention;  Surgeon:               Renford Dills, MD;  Location: [facility];                Service: Cardiovascular;; 02/20/2015: PERIPHERAL VASCULAR CATHETERIZATION; Left     Comment:  Procedure: Pelvic Angiography;  Surgeon: Renford Dills, MD;  Location: [facility];  Service:               Cardiovascular;  Laterality: Left; 09/01/2016: TRANSURETHRAL RESECTION OF BLADDER TUMOR WITH MITOMYCIN-C;  N/A     Comment:  Procedure: TRANSURETHRAL RESECTION OF BLADDER TUMOR WITH              MITOMYCIN-C;  Surgeon: Vanna Scotland, MD;  Location:               [facility];  Service: Urology;  Laterality: N/A;  BMI    Body Mass Index: 21.13 kg/m      Reproductive/Obstetrics negative OB ROS                             Anesthesia Physical Anesthesia Plan  ASA: 3  Anesthesia Plan: General   Post-op Pain Management: Ofirmev IV (intra-op)*   Induction: Intravenous  PONV Risk Score and Plan: 3 and Treatment may vary due to age or medical condition, Propofol infusion and TIVA  Airway Management Planned: Natural Airway and Simple Face Mask  Additional Equipment:   Intra-op Plan:   Post-operative Plan:   Informed Consent: I have reviewed the patients History and Physical, chart, labs and discussed the procedure including the risks, benefits and alternatives for the proposed anesthesia with the patient or authorized representative who has indicated his/her  understanding and acceptance.     Dental Advisory Given  Plan Discussed with: Anesthesiologist, CRNA and Surgeon  Anesthesia Plan Comments:         Anesthesia Quick Evaluation

## 2023-05-05 NOTE — Transfer of Care (Signed)
Immediate Anesthesia Transfer of Care Note  Patient: Laura Reed  Procedure(s) Performed: TRANSMETATARSAL AMPUTATION LEFT (Left: Toe)  Patient Location: PACU  Anesthesia Type:General  Level of Consciousness: awake  Airway & Oxygen Therapy: Patient Spontanous Breathing and Patient connected to face mask oxygen  Post-op Assessment: Report given to RN  Post vital signs: stable  Last Vitals:  Vitals Value Taken Time  BP 124/70 05/05/23 1349  Temp    Pulse 94 05/05/23 1352  Resp    SpO2 97 % 05/05/23 1352  Vitals shown include unfiled device data.  Last Pain:  Vitals:   05/05/23 1236  TempSrc: Oral  PainSc: 0-No pain      Patients Stated Pain Goal: 0 (05/03/23 0911)  Complications: No notable events documented.

## 2023-05-05 NOTE — H&P (Signed)
HISTORY AND PHYSICAL INTERVAL NOTE:  05/05/2023  12:38 PM  Laura Reed  has presented today for surgery, with the diagnosis of left foot infection.  The various methods of treatment have been discussed with the patient.  No guarantees were given.  After consideration of risks, benefits and other options for treatment, the patient has consented to surgery.  I have reviewed the patients' chart and labs.     The patient was reexamined.  There have been no changes to this history and physical examination.  Gwyneth Revels A

## 2023-05-05 NOTE — Op Note (Signed)
Operative note   Surgeon:Melony Tenpas Armed forces logistics/support/administrative officer: None    Preop diagnosis: Gangrene left forefoot    Postop diagnosis: Same    Procedure: 1.  Transmetatarsal amputation left foot 2.  Intraoperative fluoroscopy without assistance of radiologist    EBL: 20 mL    Anesthesia:local and IV sedation.  Local consisted of a total of 20 cc of 0.5% plain bupivacaine and 1% lidocaine plain    Hemostasis: None    Specimen: Transmetatarsal amputation for pathology and deep wound culture    Complications: None    Operative indications:Laura Reed is an 62 y.o. that presents today for surgical intervention.  The risks/benefits/alternatives/complications have been discussed and consent has been given.    Procedure:  Patient was brought into the OR and placed on the operating table in thesupine position. After anesthesia was obtained theleft lower extremity was prepped and draped in usual sterile fashion.  Attention was directed to the distal aspect of the left foot where gangrenous changes of the left fifth toe with cellulitis surrounding the fifth MTPJ were noted.  Dorsal and plantar skin flaps were mapped out.  Full-thickness flaps were created down to the midshaft of the metatarsals.  Osteotomies were created through this and the distal tissue was removed from the surgical field in toto.  All bleeders were Bovie cauterized.  The wound was flushed with copious amounts of irrigation.  Layered closure was then performed with a 3-0 Vicryl the deeper subcutaneous tissue and a 3-0 nylon for the skin.  A large bulky sterile dressing was then applied.    Patient tolerated the procedure and anesthesia well.  Was transported from the OR to the PACU with all vital signs stable and vascular status intact. To be discharged per routine protocol.  Will follow up in approximately 1 week in the outpatient clinic.

## 2023-05-05 NOTE — Plan of Care (Signed)

## 2023-05-05 NOTE — Progress Notes (Signed)
PT Cancellation Note  Patient Details Name: KITARA BARSTAD MRN: [id number] DOB: 29-Jun-1961   Cancelled Treatment:    Reason Eval/Treat Not Completed: Other (comment). Pt is pending Sx today for possible trans met amputation. Will continue to follow. Post Sx- please update orders with WBing status and order any post op shoe required for mobility.   Sparrow [place] 05/05/2023, 10:54 AM Elizabeth Palau, PT, DPT, GCS [phone]

## 2023-05-05 NOTE — Progress Notes (Signed)
OT Cancellation Note  Patient Details Name: Laura Reed MRN: [id number] DOB: Apr 20, 1961   Cancelled Treatment:    Reason Eval/Treat Not Completed: Patient at procedure or test/ unavailable. Pt off unit for surgery, will hold and re-initiate services pending new orders.   Kathie Dike, M.S. OTR/L  05/05/23, 12:30 PM  ascom [phone]

## 2023-05-05 NOTE — Telephone Encounter (Signed)
Sensors have been refilled.

## 2023-05-06 LAB — GLUCOSE, CAPILLARY
Glucose-Capillary: 279 mg/dL — ABNORMAL HIGH (ref 70–99)
Glucose-Capillary: 230 mg/dL — ABNORMAL HIGH (ref 70–99)
Glucose-Capillary: 210 mg/dL — ABNORMAL HIGH (ref 70–99)
Glucose-Capillary: 171 mg/dL — ABNORMAL HIGH (ref 70–99)

## 2023-05-06 MED ORDER — APIXABAN 2.5 MG PO TABS
2.5000 mg | ORAL_TABLET | Freq: Two times a day (BID) | ORAL | Status: DC
  Administered 2023-05-06 – 2023-05-11 (×10): 2.5 mg via ORAL
  Filled 2023-05-06 (×10): qty 1

## 2023-05-06 NOTE — Progress Notes (Signed)
1 Day Post-Op   Subjective/Chief Complaint: Patient resting in bedside chair comfortably and notes no pain to the left foot at this time and is kept dressings clean, dry, and intact since procedure and has remained nonweightbearing.  Objective: Vital signs in last 24 hours: Temp:  [97 F (36.1 C)-98.4 F (36.9 C)] 97.8 F (36.6 C) (10/30 0700) Pulse Rate:  [28-93] 79 (10/30 0700) Resp:  [14-19] 16 (10/30 0700) BP: (93-128)/(46-80) 100/47 (10/30 0700) SpO2:  [9 %-100 %] 100 % (10/30 0700) Last BM Date : 05/03/23  Intake/Output from previous day: 10/29 0701 - 10/30 0700 In: 1156.3 [P.O.:200; I.V.:956.3] Out: 200 [Urine:200] Intake/Output this shift: Total I/O In: 666.7 [I.V.:666.7] Out: -   Left foot transmetatarsal amputation site appears to be well coapted with staples intact, mild edema, significant amount of bloody strikethrough on dressing today upon removal but no active drainage present once dressings were removed, capillary fill time appears to be intact to dorsal and plantar flaps.  No dehiscence or obvious signs of infection present.    Lab Results:  Recent Labs    05/03/23 2222  WBC 13.6*  HGB 10.0*  HCT 28.6*  PLT 280   BMET Recent Labs    05/03/23 2222 05/04/23 0558  NA 137 139  K 3.3* 4.3  CL 107 109  CO2 23 21*  GLUCOSE 248* 306*  BUN 28* 26*  CREATININE 0.73 0.67  CALCIUM 8.3* 8.6*   PT/INR No results for input(s): "LABPROT", "INR" in the last 72 hours. ABG No results for input(s): "PHART", "HCO3" in the last 72 hours.  Invalid input(s): "PCO2", "PO2"  Studies/Results: DG MINI C-ARM IMAGE ONLY  Result Date: 05/05/2023 There is no interpretation for this exam.  This order is for images obtained during a surgical procedure.  Please See "Surgeries" Tab for more information regarding the procedure.    Anti-infectives: Anti-infectives (From admission, onward)    Start     Dose/Rate Route Frequency Ordered Stop   05/05/23 0600  ceFAZolin  (ANCEF) IVPB 2g/100 mL premix        2 g 200 mL/hr over 30 Minutes Intravenous On call to O.R. 05/04/23 1556 05/05/23 1248   05/01/23 0600  ceFAZolin (ANCEF) IVPB 2g/100 mL premix        2 g 200 mL/hr over 30 Minutes Intravenous On call to O.R. 04/30/23 2207 05/01/23 1734   04/29/23 2130  ceFAZolin (ANCEF) IVPB 2g/100 mL premix        2 g 200 mL/hr over 30 Minutes Intravenous Every 8 hours 04/29/23 1802 04/30/23 0600   04/29/23 1658  gentamicin (GARAMYCIN) injection  Status:  Discontinued          As needed 04/29/23 1658 04/29/23 1727   04/29/23 1658  vancomycin (VANCOCIN) powder  Status:  Discontinued          As needed 04/29/23 1659 04/29/23 1727   04/29/23 0730  ceFAZolin (ANCEF) IVPB 2g/100 mL premix        2 g 200 mL/hr over 30 Minutes Intravenous On call to O.R. 04/29/23 1610 04/29/23 1350   04/24/23 2100  ceFAZolin (ANCEF) IVPB 2g/100 mL premix        2 g 200 mL/hr over 30 Minutes Intravenous Every 8 hours 04/24/23 1701 04/25/23 0701   04/24/23 1107  ceFAZolin (ANCEF) IVPB 2g/100 mL premix        2 g 200 mL/hr over 30 Minutes Intravenous 30 min pre-op 04/24/23 1107 04/24/23 1729  Assessment/Plan: s/p Procedure(s): TRANSMETATARSAL AMPUTATION LEFT (Left) Assessment: Gangrenous changes left fourth and fifth toes..  Plan:  -Patient seen and examined. -Transmetatarsal amputation site appears to be well coapted overall with no signs of infection.  No active drainage. -Reapplied Xeroform gauze to the area followed by 4 x 4 gauze, ABD, Kerlix, Ace wrap.  Patient to keep dressing clean, dry, and intact for the next week.  Patient to remain nonweightbearing at all times to left lower extremity. -Wound culture from yesterday surgery with no growth to date.  Recommend Keflex 3 times daily for 7 days upon discharge. -Appreciate PT/OT recommendations.  Podiatry team to sign off at this time.  Patient to follow-up within 1 week of surgical date in outpatient clinic with Dr.  Ether Griffins.   LOS: 12 days    Rosetta Posner, DPM 05/06/2023

## 2023-05-06 NOTE — Progress Notes (Signed)
Progress Note    05/06/2023 4:46 PM 1 Day Post-Op  Subjective:  Laura Reed is now POD # 1 from a transmetatarsal left lower extremity amputation and POD #4 from Percutaneous transluminal angioplasty and stent placement left common femoral artery, left anterior tibial artery with Mechanical thrombectomy of the distal common femoral artery and femoral distal bypass graft. Patient is resting comfortably in bed. No complaints overnight and vitals all remain stable.   Patient was seen by Dr. Ether Griffins today.  Recommendation is the patient remain nonweightbearing at all times to her left lower extremity.  Wound culture from yesterday with no growth today.  Dressing was changed today.  Recommendations for Keflex 3 times a day for 7 days upon discharge.  Patient and patient's family member at the bedside this afternoon have decided that they would prefer skilled nursing facility for the next couple of weeks as she remains nonweightbearing.   Vitals:   05/06/23 0700 05/06/23 1642  BP: (!) 100/47 127/81  Pulse: 79 73  Resp: 16 14  Temp: 97.8 F (36.6 C) 98.6 F (37 C)  SpO2: 100% 98%   Physical Exam: Cardiac:  RRR, No murmurs to note. Lungs:  Clear throughout on auscultation. No rales rhonchi or wheezing to note. Incisions:  Right groin incision with dressing clean dry and intact.  Extremities:  Left lower extremity remains warm to touch and palpable DP pulse.  Abdomen:  Positive bowel sounds throughout, soft, non tender and non distended Neurologic: AAOX3, Answers questions and follows commands appropriately.   CBC    Component Value Date/Time   WBC 13.6 (H) 05/03/2023 2222   RBC 3.29 (L) 05/03/2023 2222   HGB 10.0 (L) 05/03/2023 2222   HGB 9.5 (L) 11/02/2014 0609   HCT 28.6 (L) 05/03/2023 2222   HCT 29.5 (L) 11/02/2014 0609   PLT 280 05/03/2023 2222   PLT 217 11/02/2014 0609   MCV 86.9 05/03/2023 2222   MCV 87 11/02/2014 0609   MCH 30.4 05/03/2023 2222   MCHC 35.0 05/03/2023  2222   RDW 13.6 05/03/2023 2222   RDW 13.6 11/02/2014 0609   LYMPHSABS 1.3 09/08/2022 1044   LYMPHSABS 1.2 11/02/2014 0609   MONOABS 0.7 09/08/2022 1044   MONOABS 1.1 (H) 11/02/2014 0609   EOSABS 0.1 09/08/2022 1044   EOSABS 0.3 11/02/2014 0609   BASOSABS 0.1 09/08/2022 1044   BASOSABS 0.0 11/02/2014 0609    BMET    Component Value Date/Time   NA 139 05/04/2023 0558   NA 136 08/02/2021 1629   NA 135 11/02/2014 0609   K 4.3 05/04/2023 0558   K 4.2 11/02/2014 0609   CL 109 05/04/2023 0558   CL 107 11/02/2014 0609   CO2 21 (L) 05/04/2023 0558   CO2 23 11/02/2014 0609   GLUCOSE 306 (H) 05/04/2023 0558   GLUCOSE 108 (H) 11/02/2014 0609   BUN 26 (H) 05/04/2023 0558   BUN 25 08/02/2021 1629   BUN 20 11/02/2014 0609   CREATININE 0.67 05/04/2023 0558   CREATININE 1.01 11/09/2015 1549   CREATININE 1.01 11/09/2015 1549   CALCIUM 8.6 (L) 05/04/2023 0558   CALCIUM 7.3 (L) 11/02/2014 0609   GFRNONAA >60 05/04/2023 0558   GFRNONAA 50 (L) 11/02/2014 0609   GFRAA >60 01/19/2019 0357   GFRAA 58 (L) 11/02/2014 0609    INR    Component Value Date/Time   INR 0.9 10/17/2014 1131     Intake/Output Summary (Last 24 hours) at 05/06/2023 1646 Last data filed at 05/06/2023 1043  Gross per 24 hour  Intake 1622.94 ml  Output 200 ml  Net 1422.94 ml     Assessment/Plan:  62 y.o. female is s/p  from a transmetatarsal left lower extremity amputation and POD #4 from Percutaneous transluminal angioplasty and stent placement left common femoral artery, left anterior tibial artery with Mechanical thrombectomy of the distal common femoral artery and femoral distal bypass graft.  1 Day Post-Op   PLAN: Patient to remain nonweightbearing on left lower extremity at all times Patient to discharge to skilled nursing facility when available. Pain medication as needed Continue PT OT. Patient to be discharged on ASA 81 mg daily, Plavix 75 mg daily, and Eliquis 2.5 mg twice daily. Patient also to be  discharged on Keflex 3 times a day for 7 days.  DVT prophylaxis:  ASA 81 mg daily, Plavix 75 mg daily, and Eliquis 2.5 mg twice daily.   Marcie Bal [facility] 05/06/2023 4:46 PM

## 2023-05-06 NOTE — Evaluation (Signed)
Occupational Therapy Re-Evaluation Patient Details Name: Laura Reed MRN: [id number] DOB: 08-05-1960 Today's Date: 05/06/2023   History of Present Illness 62 y/o female admitted s/p mechanical thrombectomy of L common femoral and profunda femoris and L femoral-popliteal bypass with stent placement on 04/24/23. S/p bypass graft femoral-popliteal artery on 04/29/23. S/p Transmetatarsal amputation left foot on 05/05/23.  PMH: HTN, CHF, CAD, PVD   Clinical Impression   Laura Reed was seen for OT re-evaluation on this date. Upon arrival to room pt reclined in bed, agreeable to tx. Pt requires SUPERVISION exit bed and scooting along side of bed. MOD A + RW bed>BSC stand pivot t/f, unable to maintain NWBing status. SETUP + SUPERVISION pericare in sitting. MAX A x2 for BSC>chair squat pivot t/f, +2 to maintain LLE NWBing. Pt reports anxiety with mobility, requires step by step cues to sequence transfer. 3/10 pain, RN aware. Goals updated, will continue to follow POC. Discharge recommendation updated to reflect increased assistance required. Pt will require +2 physical assist for all mobility attempts.     If plan is discharge home, recommend the following: Two people to help with walking and/or transfers;Two people to help with bathing/dressing/bathroom;Supervision due to cognitive status    Functional Status Assessment     Equipment Recommendations  Wheelchair (measurements OT)    Recommendations for Other Services       Precautions / Restrictions Precautions Precautions: Fall Restrictions Weight Bearing Restrictions: Yes LLE Weight Bearing: Non weight bearing      Mobility Bed Mobility Overal bed mobility: Needs Assistance Bed Mobility: Supine to Sit     Supine to sit: Supervision, HOB elevated          Transfers Overall transfer level: Needs assistance Equipment used: Rolling walker (2 wheels), None Transfers: Sit to/from Stand, Bed to chair/wheelchair/BSC Sit to Stand:  Mod assist Stand pivot transfers: Mod assist Squat pivot transfers: Max assist, +2 physical assistance              Balance Overall balance assessment: Needs assistance Sitting-balance support: No upper extremity supported, Feet supported Sitting balance-Leahy Scale: Good     Standing balance support: Bilateral upper extremity supported Standing balance-Leahy Scale: Poor                             ADL either performed or assessed with clinical judgement   ADL Overall ADL's : Needs assistance/impaired                                       General ADL Comments: MAX A x2 for BSC t/f. SETUP + SUPERVISION pericare in sitting.      Pertinent Vitals/Pain Pain Assessment Pain Assessment: 0-10 Pain Score: 3  Pain Location: LLE Pain Descriptors / Indicators: Aching Pain Intervention(s): Limited activity within patient's tolerance, Repositioned     Extremity/Trunk Assessment Upper Extremity Assessment Upper Extremity Assessment: Generalized weakness   Lower Extremity Assessment Lower Extremity Assessment: Generalized weakness       Communication     Cognition Arousal: Alert Behavior During Therapy: Anxious, WFL for tasks assessed/performed Overall Cognitive Status: Within Functional Limits for tasks assessed                                 General Comments: step by step cues to sequence transfer, pt  says limited by anxiety     General Comments  SpO2 95% on RA                          OT Goals(Current goals can be found in the care plan section) Acute Rehab OT Goals Patient Stated Goal: to go home OT Goal Formulation: With patient Time For Goal Achievement: 05/20/23 Potential to Achieve Goals: Good ADL Goals Pt Will Perform Lower Body Dressing: with set-up;with supervision;sitting/lateral leans Pt Will Transfer to Toilet: with min assist;with transfer board;bedside commode Additional ADL Goal #1: Pt will  complete all aspects of bathing from seated/standing positions wiht modified independence, 1/1 opportunity. Additional ADL Goal #2: Pt will verbalize plan to implement at least 1 learned falls prevention strategy to minimize risk and fear of falling.  OT Frequency: Min 1X/week       AM-PAC OT "6 Clicks" Daily Activity     Outcome Measure Help from another person eating meals?: None Help from another person taking care of personal grooming?: A Little Help from another person toileting, which includes using toliet, bedpan, or urinal?: A Lot Help from another person bathing (including washing, rinsing, drying)?: A Little Help from another person to put on and taking off regular upper body clothing?: A Little Help from another person to put on and taking off regular lower body clothing?: A Lot 6 Click Score: 17   End of Session Equipment Utilized During Treatment: Gait belt;Rolling walker (2 wheels) Nurse Communication: Mobility status  Activity Tolerance: Patient tolerated treatment well Patient left: in chair;with call bell/phone within reach;with family/visitor present  OT Visit Diagnosis: Other abnormalities of gait and mobility (R26.89);Muscle weakness (generalized) (M62.81)                Time: 1610-9604 OT Time Calculation (min): 33 min Charges:  OT General Charges $OT Visit: 1 Visit OT Evaluation $OT Re-eval: 1 Re-eval OT Treatments $Self Care/Home Management : 23-37 mins  Laura Reed, M.S. OTR/L  05/06/23, 9:57 AM  ascom [phone]

## 2023-05-06 NOTE — Plan of Care (Signed)
  Problem: Health Behavior/Discharge Planning: Goal: Ability to manage health-related needs will improve 05/06/2023 0213 by Saddie Benders, RN Outcome: Progressing 05/06/2023 0157 by Saddie Benders, RN Outcome: Progressing

## 2023-05-06 NOTE — Evaluation (Signed)
Physical Therapy Re-Evaluation Patient Details Name: [name] MRN: [id number] DOB: 13-Jan-1961 Today's Date: 05/06/2023  History of Present Illness  62 y/o female admitted s/p mechanical thrombectomy of L common femoral and profunda femoris and L femoral-popliteal bypass with stent placement on 04/24/23. S/p bypass graft femoral-popliteal artery on 04/29/23. S/p Transmetatarsal amputation left foot on 05/05/23.  PMH: HTN, CHF, CAD, PVD   Clinical Impression  Pt received in recliner with sister at bedside and agreed to Re-Evaluation PT session. Pt reports feeling anxious with mobility. With the use of RW (2wheels) pt performed 2xSTS MODA+2 prior to ending session in recliner. Excessive VC necessary for RW management as well as step-by-step instructions for STS biomechanics. Pt will continue to benefit from skilled PT sessions in order to improve activity tolerance, functional mobility, and maximize IND following D/C. Communication with team via secure chat occurred regarding change in D/C disposition.      If plan is discharge home, recommend the following: A lot of help with walking and/or transfers;A little help with bathing/dressing/bathroom;Assist for transportation;Help with stairs or ramp for entrance   Can travel by private vehicle   Yes    Equipment Recommendations Rolling walker (2 wheels);BSC/3in1;Wheelchair (measurements PT)  Recommendations for Other Services       Functional Status Assessment Patient has had a recent decline in their functional status and demonstrates the ability to make significant improvements in function in a reasonable and predictable amount of time.     Precautions / Restrictions Precautions Precautions: Fall Precaution Comments: wound vac Restrictions Weight Bearing Restrictions: Yes LLE Weight Bearing: Non weight bearing      Mobility  Bed Mobility               General bed mobility comments: Pt received in recliner. Bed mobility  deferred.    Transfers Overall transfer level: Needs assistance Equipment used: Rolling walker (2 wheels) Transfers: Sit to/from Stand Sit to Stand: Mod assist, +2 physical assistance           General transfer comment: Pt needed excessive VC for WB precautions as well as STS biomechanics and RW management. Pt presented with poor eccentric control. RW adjusted to replicate appropriate height.    Ambulation/Gait               General Gait Details: Deferred  Stairs            Wheelchair Mobility     Tilt Bed    Modified Rankin (Stroke Patients Only)       Balance Overall balance assessment: Needs assistance Sitting-balance support: No upper extremity supported, Feet supported Sitting balance-Leahy Scale: Good     Standing balance support: Bilateral upper extremity supported, During functional activity, Reliant on assistive device for balance Standing balance-Leahy Scale: Poor                               Pertinent Vitals/Pain Pain Assessment Pain Assessment: Faces Faces Pain Scale: Hurts a little bit Pain Location: LLE Pain Descriptors / Indicators: Aching Pain Intervention(s): Monitored during session    Home Living Family/patient expects to be discharged to:: Private residence Living Arrangements: Alone Available Help at Discharge: Family;Available PRN/intermittently Type of Home: House Home Access: Stairs to enter Entrance Stairs-Rails: Right;Left;Can reach both Entrance Stairs-Number of Steps: 2   Home Layout: One level Home Equipment: None      Prior Function Prior Level of Function : Independent/Modified Independent;Driving  Extremity/Trunk Assessment   Upper Extremity Assessment Upper Extremity Assessment: Overall WFL for tasks assessed    Lower Extremity Assessment Lower Extremity Assessment: LLE deficits/detail LLE Deficits / Details: Transmetatarsal amputation       Communication    Communication Communication: No apparent difficulties Cueing Techniques: Verbal cues;Tactile cues  Cognition Arousal: Alert Behavior During Therapy: Anxious, WFL for tasks assessed/performed Overall Cognitive Status: Within Functional Limits for tasks assessed                                 General Comments: step by step cues to sequence transfer, pt says limited by anxiety        General Comments      Exercises     Assessment/Plan    PT Assessment Patient needs continued PT services  PT Problem List Decreased strength;Decreased activity tolerance;Decreased balance;Decreased mobility;Decreased safety awareness;Decreased knowledge of use of DME;Decreased knowledge of precautions       PT Treatment Interventions DME instruction;Therapeutic activities    PT Goals (Current goals can be found in the Care Plan section)  Acute Rehab PT Goals Patient Stated Goal: to get better PT Goal Formulation: With patient Time For Goal Achievement: 05/14/23 Potential to Achieve Goals: Good    Frequency Min 1X/week     Co-evaluation               AM-PAC PT "6 Clicks" Mobility  Outcome Measure Help needed turning from your back to your side while in a flat bed without using bedrails?: A Little Help needed moving from lying on your back to sitting on the side of a flat bed without using bedrails?: A Little Help needed moving to and from a bed to a chair (including a wheelchair)?: A Lot Help needed standing up from a chair using your arms (e.g., wheelchair or bedside chair)?: A Lot Help needed to walk in hospital room?: Total Help needed climbing 3-5 steps with a railing? : Total 6 Click Score: 12    End of Session Equipment Utilized During Treatment: Gait belt Activity Tolerance: Patient tolerated treatment well Patient left: in chair;with call bell/phone within reach;with family/visitor present Nurse Communication: Mobility status PT Visit Diagnosis:  Unsteadiness on feet (R26.81);Muscle weakness (generalized) (M62.81);Difficulty in walking, not elsewhere classified (R26.2)    Time: 1005-1031 PT Time Calculation (min) (ACUTE ONLY): 26 min   Charges:   PT Evaluation $PT Re-evaluation: 1 Re-eval PT Treatments $Therapeutic Activity: 8-22 mins PT General Charges $$ ACUTE PT VISIT: 1 Visit         Laura Reed SPT, LAT, ATC   Laura Reed 05/06/2023, 3:05 PM

## 2023-05-06 NOTE — NC FL2 (Signed)
[place] MEDICAID FL2 LEVEL OF CARE FORM     IDENTIFICATION  Patient Name: Laura Reed Birthdate: [birth year] Sex: female Admission Date (Current Location): 04/24/2023  [facility] and [place] Number:  Chiropodist and Address:  [facility], [address], [place], [place] [address]      Provider Number: [id number]  Attending Physician Name and Address:  Renford Dills, MD  Relative Name and Phone Number:       Current Level of Care: Hospital Recommended Level of Care: Skilled Nursing Facility Prior Approval Number:    Date Approved/Denied:   PASRR Number: [id number]  Discharge Plan: SNF    Current Diagnoses: Patient Active Problem List   Diagnosis Date Noted   Atherosclerosis of artery of extremity with rest pain (HCC) 04/24/2023   Tubular adenoma of colon 04/03/2022   Positive colorectal cancer screening using Cologuard test 12/10/2021   Effusion of right knee 10/16/2021   Acute pain of right knee 10/16/2021   Ventricular bigeminy 08/02/2021   History of amputation of toe (HCC) 06/04/2021   Anxiety state 04/01/2017   Atherosclerosis of native arteries of extremity with intermittent claudication (HCC) 01/18/2017   Carotid stenosis 12/04/2016   Insomnia secondary to anxiety 02/21/2016   Thyroid nodule 02/21/2016   Hospital discharge follow-up 11/11/2015   Encounter for preventive health examination 08/02/2015   S/p nephrectomy 05/15/2015   Diabetic nephropathy with proteinuria (HCC) 01/27/2015   Overweight (BMI 25.0-29.9) 10/07/2014   History of renal cell carcinoma 09/28/2014   CAD (coronary artery disease) 08/24/2014   Insulin-requiring or dependent type II diabetes mellitus (HCC) 08/24/2014   Hernia of abdominal cavity 08/10/2014   Hyperlipidemia associated with type 2 diabetes mellitus (HCC) 06/17/2014   S/P carotid endarterectomy 01/22/2014   Cardiac arrhythmia 11/14/2013   History of shingles 05/18/2013    Preoperative evaluation of a medical condition to rule out surgical contraindications (TAR required) 04/06/2013   Diabetic peripheral neuropathy associated with type 2 diabetes mellitus (HCC) 04/06/2013   Chronic hip pain 01/16/2013   Routine general medical examination at a health care facility 06/26/2012   PVD (peripheral vascular disease) (HCC)    History of tobacco abuse 04/29/2011   Controlled type 2 DM with peripheral circulatory disorder (HCC)    Hypertension    Hemorrhoid     Orientation RESPIRATION BLADDER Height & Weight     Self, Time, Situation, Place  O2 (Nasal Cannula 2 L) Incontinent, External catheter Weight: 135 lb (61.2 kg) Height:  5\' 7"  (170.2 cm)  BEHAVIORAL SYMPTOMS/MOOD NEUROLOGICAL BOWEL NUTRITION STATUS   (None)  (None) Incontinent Diet (Carb modified)  AMBULATORY STATUS COMMUNICATION OF NEEDS Skin   Extensive Assist Verbally Surgical wounds, Bruising, Wound Vac (Incision on left groin (prevena/disposable wound vac), left leg (honeycomb), and left foot (ABD, gauze, xeroform, ace wrap).)                       Personal Care Assistance Level of Assistance  Bathing, Feeding, Dressing Bathing Assistance: Maximum assistance Feeding assistance: Limited assistance Dressing Assistance: Maximum assistance     Functional Limitations Info  Sight, Hearing, Speech Sight Info: Adequate Hearing Info: Adequate Speech Info: Adequate    SPECIAL CARE FACTORS FREQUENCY  PT (By licensed PT), OT (By licensed OT)     PT Frequency: 5 x week OT Frequency: 5 x week            Contractures Contractures Info: Not present    Additional Factors Info  Code Status, Allergies Code Status Info: Full code Allergies Info: NKDA           Current Medications (05/06/2023):  This is the current hospital active medication list Current Facility-Administered Medications  Medication Dose Route Frequency Provider Last Rate Last Admin   0.9 %  sodium chloride infusion    Intravenous Continuous [name], Latina Craver, MD 100 mL/hr at 05/06/23 0946 New Bag at 05/06/23 0946   acetaminophen (TYLENOL) tablet 325-650 mg  325-650 mg Oral Q4H PRN Renford Dills, MD   650 mg at 05/03/23 9562   Or   acetaminophen (TYLENOL) suppository 325-650 mg  325-650 mg Rectal Q4H PRN [name], Latina Craver, MD       ALPRAZolam Prudy Feeler) tablet 0.5 mg  0.5 mg Oral BID PRN Gilda Crease Latina Craver, MD   0.5 mg at 05/05/23 2025   alum & mag hydroxide-simeth (MAALOX/MYLANTA) 200-200-20 MG/5ML suspension 15-30 mL  15-30 mL Oral Q2H PRN [name], Latina Craver, MD       amLODipine (NORVASC) tablet 2.5 mg  2.5 mg Oral Daily [name], Latina Craver, MD   2.5 mg at 05/05/23 0920   aspirin EC tablet 81 mg  81 mg Oral Q0600 Renford Dills, MD   81 mg at 05/06/23 0520   atorvastatin (LIPITOR) tablet 20 mg  20 mg Oral Daily [name], Latina Craver, MD   20 mg at 05/05/23 2025   clopidogrel (PLAVIX) tablet 75 mg  75 mg Oral Q0600 Renford Dills, MD   75 mg at 05/06/23 1308   docusate sodium (COLACE) capsule 100 mg  100 mg Oral Daily [name], Latina Craver, MD   100 mg at 05/06/23 0916   famotidine (PEPCID) tablet 20 mg  20 mg Oral Daily [name], Latina Craver, MD   20 mg at 05/06/23 0916   insulin aspart (novoLOG) injection 0-15 Units  0-15 Units Subcutaneous TID WC [name], Latina Craver, MD   8 Units at 05/06/23 6578   labetalol (NORMODYNE) injection 10 mg  10 mg Intravenous Q10 min PRN [name], Latina Craver, MD       losartan (COZAAR) tablet 100 mg  100 mg Oral Daily [name], Latina Craver, MD   100 mg at 05/05/23 0920   magnesium sulfate IVPB 2 g 50 mL  2 g Intravenous Daily PRN [name], Latina Craver, MD       magnesium sulfate IVPB 2 g 50 mL  2 g Intravenous Daily PRN [name], Latina Craver, MD       metoprolol tartrate (LOPRESSOR) injection 2-5 mg  2-5 mg Intravenous Q2H PRN [name], Latina Craver, MD   5 mg at 05/05/23 0344   morphine (PF) 2 MG/ML injection 2 mg  2 mg Intravenous Q1H PRN [name], Latina Craver, MD       nitroGLYCERIN 50 mg in  dextrose 5 % 250 mL (0.2 mg/mL) infusion  5-250 mcg/min Intravenous Titrated [name], Latina Craver, MD       ondansetron [facility]) injection 4 mg  4 mg Intravenous Q6H PRN [name], Latina Craver, MD       oxyCODONE (Oxy IR/ROXICODONE) immediate release tablet 5-10 mg  5-10 mg Oral Q4H PRN [name], Latina Craver, MD   10 mg at 05/06/23 4696   potassium chloride SA (KLOR-CON M) CR tablet 20-40 mEq  20-40 mEq Oral Daily PRN [name], Latina Craver, MD       senna-docusate (Senokot-S) tablet 1 tablet  1 tablet Oral QHS PRN [name], Latina Craver, MD       sodium chloride  flush (NS) 0.9 % injection 3 mL  3 mL Intravenous Q12H [name], Latina Craver, MD   3 mL at 05/06/23 0940   sodium chloride flush (NS) 0.9 % injection 3 mL  3 mL Intravenous PRN [name], Latina Craver, MD         Discharge Medications: Please see discharge summary for a list of discharge medications.  Relevant Imaging Results:  Relevant Lab Results:   Additional Information SS#: [id number]  Margarito Liner, LCSW

## 2023-05-06 NOTE — Plan of Care (Signed)
  Problem: Health Behavior/Discharge Planning: Goal: Ability to manage health-related needs will improve Outcome: Progressing   

## 2023-05-06 NOTE — TOC Progression Note (Signed)
Transition of Care [facility]) - Progression Note    Patient Details  Name: Laura Reed MRN: [id number] Date of Birth: 1961/04/21  Transition of [facility]) CM/SW Contact  Margarito Liner, LCSW Phone Number: 05/06/2023, 12:00 PM  Clinical Narrative: PT and OT recommendations have changed to SNF. CSW met with patient and sister to discuss. They are willing to consider it depending on how long she will be NWB status on her foot. CSW sent secure chat to team to notify. CSW gave CMS scores for facilities within 25 miles of her zip code sent out SNF referral. First preference is [facility], second preference is [facility]. CSW left message for [facility] admissions coordinator asking her to review.  Expected Discharge Plan: Home w Home Health Services    Expected Discharge Plan and Services     Post Acute Care Choice: Home Health Living arrangements for the past 2 months: Apartment                                       Social Determinants of Health (SDOH) Interventions SDOH Screenings   Food Insecurity: No Food Insecurity (04/24/2023)  Housing: Low Risk  (04/24/2023)  Transportation Needs: No Transportation Needs (04/24/2023)  Utilities: Not At Risk (04/24/2023)  Alcohol Screen: Low Risk  (09/27/2020)  Depression (PHQ2-9): Low Risk  (09/08/2022)  Financial Resource Strain: Low Risk  (06/12/2022)  Physical Activity: Inactive (06/10/2021)  Social Connections: Moderately Isolated (06/12/2022)  Stress: No Stress Concern Present (06/12/2022)  Tobacco Use: Medium Risk (05/05/2023)    Readmission Risk Interventions     No data to display

## 2023-05-07 ENCOUNTER — Encounter: Payer: Self-pay | Admitting: Podiatry

## 2023-05-07 LAB — GLUCOSE, CAPILLARY
Glucose-Capillary: 261 mg/dL — ABNORMAL HIGH (ref 70–99)
Glucose-Capillary: 190 mg/dL — ABNORMAL HIGH (ref 70–99)
Glucose-Capillary: 229 mg/dL — ABNORMAL HIGH (ref 70–99)
Glucose-Capillary: 271 mg/dL — ABNORMAL HIGH (ref 70–99)

## 2023-05-07 NOTE — Progress Notes (Signed)
Physical Therapy Treatment Patient Details Name: MAARI VALENCIA MRN: [id number] DOB: 22-Feb-1961 Today's Date: 05/07/2023   History of Present Illness 62 y/o female admitted s/p mechanical thrombectomy of L common femoral and profunda femoris and L femoral-popliteal bypass with stent placement on 04/24/23. S/p bypass graft femoral-popliteal artery on 04/29/23. S/p Transmetatarsal amputation left foot on 05/05/23.  PMH: HTN, CHF, CAD, PVD    PT Comments  Patient is agreeable to PT session. She reports feeling anxious at times that increases with mobility efforts. Patient has difficulty sequencing mobility tasks and required maximal cues for technique with transfers to maintain NWB of LLE. 2+ person assistance required for standing. Recommend to continue PT to maximize independence. Recommend rehabilitation <3 hours/day after this hospital stay.    If plan is discharge home, recommend the following: A lot of help with walking and/or transfers;A little help with bathing/dressing/bathroom;Assist for transportation;Help with stairs or ramp for entrance   Can travel by private vehicle     Yes  Equipment Recommendations  Rolling walker (2 wheels);BSC/3in1;Wheelchair (measurements PT)    Recommendations for Other Services       Precautions / Restrictions Precautions Precautions: Fall Precaution Comments: wound vac Restrictions Weight Bearing Restrictions: Yes LLE Weight Bearing: Non weight bearing     Mobility  Bed Mobility Overal bed mobility: Needs Assistance Bed Mobility: Supine to Sit, Sit to Supine     Supine to sit: Min assist, HOB elevated, Used rails Sit to supine: Min assist, HOB elevated        Transfers Overall transfer level: Needs assistance Equipment used: Rolling walker (2 wheels) Transfers: Sit to/from Stand, Bed to chair/wheelchair/BSC Sit to Stand: Mod assist, +2 physical assistance, From elevated surface          Lateral/Scoot Transfers: Mod assist, +2  physical assistance General transfer comment: patient was unable to stand with Max A + 2 with bed in lowest position. with elevated bed height, patient able to stand with Mod A +2. maximal cues and constant reinforcement of technique to maintain NWB LLE with standing    Ambulation/Gait             Pre-gait activities: standing tolerance of less than 1 minute. heavy use of rolling walker for support and reinforcement of importance of maintaining NWB of RLE     Stairs             Wheelchair Mobility     Tilt Bed    Modified Rankin (Stroke Patients Only)       Balance Overall balance assessment: Needs assistance Sitting-balance support: No upper extremity supported, Feet supported Sitting balance-Leahy Scale: Good     Standing balance support: Bilateral upper extremity supported, During functional activity, Reliant on assistive device for balance Standing balance-Leahy Scale: Poor Standing balance comment: requires external support to maintain                            Cognition Arousal: Alert Behavior During Therapy: Anxious Overall Cognitive Status: Within Functional Limits for tasks assessed                                 General Comments: difficulty with sequencing tasks. multi modal cues and repetition required with functional mobility tasks        Exercises Other Exercises Other Exercises: heart rate in the 80's with activity    General Comments  Pertinent Vitals/Pain Pain Assessment Pain Assessment: 0-10 Pain Score: 3  Pain Location: L shin Pain Descriptors / Indicators: Aching Pain Intervention(s): Limited activity within patient's tolerance, Monitored during session, Repositioned    Home Living                          Prior Function            PT Goals (current goals can now be found in the care plan section) Acute Rehab PT Goals Patient Stated Goal: to get better PT Goal Formulation:  With patient Time For Goal Achievement: 05/14/23 Potential to Achieve Goals: Good Progress towards PT goals: Progressing toward goals    Frequency    Min 1X/week      PT Plan      Co-evaluation PT/OT/SLP Co-Evaluation/Treatment: Yes Reason for Co-Treatment: To address functional/ADL transfers PT goals addressed during session: Mobility/safety with mobility OT goals addressed during session: ADL's and self-care      AM-PAC PT "6 Clicks" Mobility   Outcome Measure  Help needed turning from your back to your side while in a flat bed without using bedrails?: A Little Help needed moving from lying on your back to sitting on the side of a flat bed without using bedrails?: A Little Help needed moving to and from a bed to a chair (including a wheelchair)?: A Lot Help needed standing up from a chair using your arms (e.g., wheelchair or bedside chair)?: A Lot Help needed to walk in hospital room?: Total Help needed climbing 3-5 steps with a railing? : Total 6 Click Score: 12    End of Session   Activity Tolerance: Patient tolerated treatment well Patient left: in bed;with call bell/phone within reach;with bed alarm set;with family/visitor present   PT Visit Diagnosis: Unsteadiness on feet (R26.81);Muscle weakness (generalized) (M62.81);Difficulty in walking, not elsewhere classified (R26.2)     Time: 1610-9604 PT Time Calculation (min) (ACUTE ONLY): 23 min  Charges:    $Therapeutic Activity: 8-22 mins PT General Charges $$ ACUTE PT VISIT: 1 Visit                     Donna Bernard, PT, MPT    Ina Homes 05/07/2023, 1:00 PM

## 2023-05-07 NOTE — Plan of Care (Signed)
  Problem: Education: Goal: Knowledge of General Education information will improve Description: Including pain rating scale, medication(s)/side effects and non-pharmacologic comfort measures Outcome: Progressing   Problem: Health Behavior/Discharge Planning: Goal: Ability to manage health-related needs will improve Outcome: Progressing   Problem: Clinical Measurements: Goal: Ability to maintain clinical measurements within normal limits will improve Outcome: Progressing Goal: Will remain free from infection Outcome: Progressing Goal: Diagnostic test results will improve Outcome: Progressing Goal: Respiratory complications will improve Outcome: Progressing Goal: Cardiovascular complication will be avoided Outcome: Progressing   Problem: Activity: Goal: Risk for activity intolerance will decrease Outcome: Progressing   Problem: Nutrition: Goal: Adequate nutrition will be maintained Outcome: Progressing   Problem: Coping: Goal: Level of anxiety will decrease Outcome: Progressing   Problem: Elimination: Goal: Will not experience complications related to bowel motility Outcome: Progressing Goal: Will not experience complications related to urinary retention Outcome: Progressing   Problem: Pain Managment: Goal: General experience of comfort will improve Outcome: Progressing   Problem: Safety: Goal: Ability to remain free from injury will improve Outcome: Progressing   Problem: Skin Integrity: Goal: Risk for impaired skin integrity will decrease Outcome: Progressing   Problem: Education: Goal: Ability to describe self-care measures that may prevent or decrease complications (Diabetes Survival Skills Education) will improve Outcome: Progressing Goal: Individualized Educational Video(s) Outcome: Progressing   Problem: Coping: Goal: Ability to adjust to condition or change in health will improve Outcome: Progressing   Problem: Fluid Volume: Goal: Ability to  maintain a balanced intake and output will improve Outcome: Progressing   Problem: Health Behavior/Discharge Planning: Goal: Ability to identify and utilize available resources and services will improve Outcome: Progressing Goal: Ability to manage health-related needs will improve Outcome: Progressing   Problem: Metabolic: Goal: Ability to maintain appropriate glucose levels will improve Outcome: Progressing   Problem: Nutritional: Goal: Maintenance of adequate nutrition will improve Outcome: Progressing Goal: Progress toward achieving an optimal weight will improve Outcome: Progressing   Problem: Skin Integrity: Goal: Risk for impaired skin integrity will decrease Outcome: Progressing   Problem: Tissue Perfusion: Goal: Adequacy of tissue perfusion will improve Outcome: Progressing   Problem: Education: Goal: Knowledge of prescribed regimen will improve Outcome: Progressing   Problem: Activity: Goal: Ability to tolerate increased activity will improve Outcome: Progressing   Problem: Bowel/Gastric: Goal: Gastrointestinal status for postoperative course will improve Outcome: Progressing   Problem: Clinical Measurements: Goal: Postoperative complications will be avoided or minimized Outcome: Progressing Goal: Signs and symptoms of graft occlusion will improve Outcome: Progressing   Problem: Skin Integrity: Goal: Demonstration of wound healing without infection will improve Outcome: Progressing   Problem: Education: Goal: Understanding of CV disease, CV risk reduction, and recovery process will improve Outcome: Progressing Goal: Individualized Educational Video(s) Outcome: Progressing   Problem: Activity: Goal: Ability to return to baseline activity level will improve Outcome: Progressing   Problem: Cardiovascular: Goal: Ability to achieve and maintain adequate cardiovascular perfusion will improve Outcome: Progressing Goal: Vascular access site(s) Level 0-1  will be maintained Outcome: Progressing   Problem: Health Behavior/Discharge Planning: Goal: Ability to safely manage health-related needs after discharge will improve Outcome: Progressing

## 2023-05-07 NOTE — Progress Notes (Signed)
Occupational Therapy Treatment Patient Details Name: Laura Reed MRN: [id number] DOB: 1960-10-04 Today's Date: 05/07/2023   History of present illness 62 y/o female admitted s/p mechanical thrombectomy of L common femoral and profunda femoris and L femoral-popliteal bypass with stent placement on 04/24/23. S/p bypass graft femoral-popliteal artery on 04/29/23. S/p Transmetatarsal amputation left foot on 05/05/23.  PMH: HTN, CHF, CAD, PVD   OT comments  Pt seen for OT tx and co-tx with PT to optimize safety and progress with ADL mobility. Pt initially hesitant citing anxiety but with active listening and support with gentle reassurance and encouragement pt agreeable. Pt required MIN A for bed mobility, MOD A +2 ultimately to successfully stand from elevated EOB with MAX multimodal cues for sequencing, safety, RW mgt, and hand/feet placement. MOD A +2 for lateral scoots with extensive multimodal cues as well for sequencing. Pain remained 3-4/10 in L shin. Pt continues to benefit from skilled OT services to maximize return to PLOF.       If plan is discharge home, recommend the following:  Two people to help with walking and/or transfers;Two people to help with bathing/dressing/bathroom;Supervision due to cognitive status   Equipment Recommendations  Wheelchair (measurements OT);Wheelchair cushion (measurements OT)    Recommendations for Other Services      Precautions / Restrictions Precautions Precautions: Fall Precaution Comments: wound vac Restrictions Weight Bearing Restrictions: Yes LLE Weight Bearing: Non weight bearing       Mobility Bed Mobility Overal bed mobility: Needs Assistance Bed Mobility: Supine to Sit, Sit to Supine     Supine to sit: Min assist, HOB elevated, Used rails Sit to supine: Min assist, HOB elevated        Transfers Overall transfer level: Needs assistance Equipment used: Rolling walker (2 wheels) Transfers: Sit to/from Stand, Bed to  chair/wheelchair/BSC Sit to Stand: Mod assist, +2 physical assistance, From elevated surface          Lateral/Scoot Transfers: Mod assist, +2 physical assistance General transfer comment: Pt unable to stand with MAXA +2 from std height bed. With bed elevated several inches, pt able to complete with MODA  +2 with extensive VC/TC for feet placement, hand placement, sequencing     Balance Overall balance assessment: Needs assistance Sitting-balance support: No upper extremity supported, Feet supported Sitting balance-Leahy Scale: Good     Standing balance support: Bilateral upper extremity supported, During functional activity, Reliant on assistive device for balance Standing balance-Leahy Scale: Poor Standing balance comment: requires external support to maintain                           ADL either performed or assessed with clinical judgement   ADL                                              Extremity/Trunk Assessment              Vision       Perception     Praxis      Cognition Arousal: Alert Behavior During Therapy: Anxious                                   General Comments: Pt demonstrates poor insight into performance, slow processing requiring VC for safety and  sequencing of all ADL mobility efforts        Exercises      Shoulder Instructions       General Comments      Pertinent Vitals/ Pain       Pain Assessment Pain Assessment: 0-10 Pain Score: 3  Pain Location: L shin Pain Descriptors / Indicators: Aching Pain Intervention(s): Monitored during session, Repositioned  Home Living                                          Prior Functioning/Environment              Frequency  Min 1X/week        Progress Toward Goals  OT Goals(current goals can now be found in the care plan section)  Progress towards OT goals: Progressing toward goals  Acute Rehab OT  Goals Patient Stated Goal: go home OT Goal Formulation: With patient Time For Goal Achievement: 05/20/23 Potential to Achieve Goals: Fair  Plan      Co-evaluation    PT/OT/SLP Co-Evaluation/Treatment: Yes Reason for Co-Treatment: To address functional/ADL transfers PT goals addressed during session: Mobility/safety with mobility OT goals addressed during session: ADL's and self-care      AM-PAC OT "6 Clicks" Daily Activity     Outcome Measure   Help from another person eating meals?: None Help from another person taking care of personal grooming?: A Little Help from another person toileting, which includes using toliet, bedpan, or urinal?: A Lot Help from another person bathing (including washing, rinsing, drying)?: A Little Help from another person to put on and taking off regular upper body clothing?: A Little Help from another person to put on and taking off regular lower body clothing?: A Lot 6 Click Score: 17    End of Session Equipment Utilized During Treatment: Rolling walker (2 wheels)  OT Visit Diagnosis: Other abnormalities of gait and mobility (R26.89);Muscle weakness (generalized) (M62.81) Pain - Right/Left: Left Pain - part of body: Leg   Activity Tolerance Patient tolerated treatment well   Patient Left in bed;with call bell/phone within reach;with bed alarm set;with family/visitor present   Nurse Communication          Time: 3244-0102 OT Time Calculation (min): 23 min  Charges: OT General Charges $OT Visit: 1 Visit OT Treatments $Therapeutic Activity: 8-22 mins  Arman Filter., MPH, MS, OTR/L ascom [phone] 05/07/23, 12:49 PM

## 2023-05-07 NOTE — TOC Progression Note (Addendum)
Transition of Care [facility]) - Progression Note    Patient Details  Name: Laura Reed MRN: [id number] Date of Birth: 02-04-1961  [facility] CM/SW Contact  Margarito Liner, LCSW Phone Number: 05/07/2023, 11:32 AM  Clinical Narrative:  [facility] not accept patient's insurance. [facility] is considering her. CSW has contacted the admissions coordinator and is awaiting final decision.  3:54 pm: Still no answer from [employer]. CSW provided bed offers to patient and sister for them to review in case [facility] cannot take her.  Expected Discharge Plan: Home w Home Health Services    Expected Discharge Plan and Services     Post Acute Care Choice: Home Health Living arrangements for the past 2 months: Apartment                                       Social Determinants of Health (SDOH) Interventions SDOH Screenings   Food Insecurity: No Food Insecurity (04/24/2023)  Housing: Low Risk  (04/24/2023)  Transportation Needs: No Transportation Needs (04/24/2023)  Utilities: Not At Risk (04/24/2023)  Alcohol Screen: Low Risk  (09/27/2020)  Depression (PHQ2-9): Low Risk  (09/08/2022)  Financial Resource Strain: Low Risk  (06/12/2022)  Physical Activity: Inactive (06/10/2021)  Social Connections: Moderately Isolated (06/12/2022)  Stress: No Stress Concern Present (06/12/2022)  Tobacco Use: Medium Risk (05/05/2023)    Readmission Risk Interventions     No data to display

## 2023-05-08 LAB — GLUCOSE, CAPILLARY
Glucose-Capillary: 302 mg/dL — ABNORMAL HIGH (ref 70–99)
Glucose-Capillary: 205 mg/dL — ABNORMAL HIGH (ref 70–99)
Glucose-Capillary: 204 mg/dL — ABNORMAL HIGH (ref 70–99)
Glucose-Capillary: 116 mg/dL — ABNORMAL HIGH (ref 70–99)

## 2023-05-08 LAB — SURGICAL PATHOLOGY

## 2023-05-08 MED ORDER — INSULIN GLARGINE-YFGN 100 UNIT/ML ~~LOC~~ SOLN
15.0000 [IU] | Freq: Every day | SUBCUTANEOUS | Status: DC
  Administered 2023-05-08 – 2023-05-11 (×4): 15 [IU] via SUBCUTANEOUS
  Filled 2023-05-08 (×4): qty 0.15

## 2023-05-08 NOTE — Plan of Care (Signed)
  Problem: Education: Goal: Knowledge of General Education information will improve Description: Including pain rating scale, medication(s)/side effects and non-pharmacologic comfort measures Outcome: Progressing   Problem: Health Behavior/Discharge Planning: Goal: Ability to manage health-related needs will improve Outcome: Progressing   Problem: Clinical Measurements: Goal: Ability to maintain clinical measurements within normal limits will improve Outcome: Progressing Goal: Will remain free from infection Outcome: Progressing Goal: Diagnostic test results will improve Outcome: Progressing Goal: Respiratory complications will improve Outcome: Progressing Goal: Cardiovascular complication will be avoided Outcome: Progressing   Problem: Activity: Goal: Risk for activity intolerance will decrease Outcome: Progressing   Problem: Nutrition: Goal: Adequate nutrition will be maintained Outcome: Progressing   Problem: Coping: Goal: Level of anxiety will decrease Outcome: Progressing   Problem: Elimination: Goal: Will not experience complications related to bowel motility Outcome: Progressing Goal: Will not experience complications related to urinary retention Outcome: Progressing   Problem: Pain Managment: Goal: General experience of comfort will improve Outcome: Progressing   Problem: Safety: Goal: Ability to remain free from injury will improve Outcome: Progressing   Problem: Skin Integrity: Goal: Risk for impaired skin integrity will decrease Outcome: Progressing   Problem: Education: Goal: Ability to describe self-care measures that may prevent or decrease complications (Diabetes Survival Skills Education) will improve Outcome: Progressing Goal: Individualized Educational Video(s) Outcome: Progressing   Problem: Coping: Goal: Ability to adjust to condition or change in health will improve Outcome: Progressing   Problem: Fluid Volume: Goal: Ability to  maintain a balanced intake and output will improve Outcome: Progressing   Problem: Health Behavior/Discharge Planning: Goal: Ability to identify and utilize available resources and services will improve Outcome: Progressing Goal: Ability to manage health-related needs will improve Outcome: Progressing   Problem: Metabolic: Goal: Ability to maintain appropriate glucose levels will improve Outcome: Progressing   Problem: Nutritional: Goal: Maintenance of adequate nutrition will improve Outcome: Progressing Goal: Progress toward achieving an optimal weight will improve Outcome: Progressing   Problem: Skin Integrity: Goal: Risk for impaired skin integrity will decrease Outcome: Progressing   Problem: Tissue Perfusion: Goal: Adequacy of tissue perfusion will improve Outcome: Progressing   Problem: Education: Goal: Knowledge of prescribed regimen will improve Outcome: Progressing   Problem: Activity: Goal: Ability to tolerate increased activity will improve Outcome: Progressing   Problem: Bowel/Gastric: Goal: Gastrointestinal status for postoperative course will improve Outcome: Progressing   Problem: Clinical Measurements: Goal: Postoperative complications will be avoided or minimized Outcome: Progressing Goal: Signs and symptoms of graft occlusion will improve Outcome: Progressing   Problem: Skin Integrity: Goal: Demonstration of wound healing without infection will improve Outcome: Progressing   Problem: Education: Goal: Understanding of CV disease, CV risk reduction, and recovery process will improve Outcome: Progressing Goal: Individualized Educational Video(s) Outcome: Progressing   Problem: Activity: Goal: Ability to return to baseline activity level will improve Outcome: Progressing   Problem: Cardiovascular: Goal: Ability to achieve and maintain adequate cardiovascular perfusion will improve Outcome: Progressing Goal: Vascular access site(s) Level 0-1  will be maintained Outcome: Progressing   Problem: Health Behavior/Discharge Planning: Goal: Ability to safely manage health-related needs after discharge will improve Outcome: Progressing

## 2023-05-08 NOTE — Progress Notes (Signed)
Progress Note    05/08/2023 3:00 PM 3 Days Post-Op  Subjective:  Laura Reed is now POD # 2 from a transmetatarsal left lower extremity amputation and POD #5 from Percutaneous transluminal angioplasty and stent placement left common femoral artery, left anterior tibial artery with Mechanical thrombectomy of the distal common femoral artery and femoral distal bypass graft. Patient is resting comfortably in bed. No complaints overnight and vitals all remain stable.   Patient was seen by Dr. Ether Griffins today.  Recommendation is the patient remain nonweightbearing at all times to her left lower extremity.  Wound culture from yesterday with no growth today.  Dressing was changed today.  Recommendations for Keflex 3 times a day for 7 days upon discharge.  Patient and patient's family member at the bedside this afternoon have decided that they would prefer skilled nursing facility for the next couple of weeks as she remains nonweightbearing. They have been given choices of accepting facilities. They are deciding on where to go.    Vitals:   05/08/23 0738 05/08/23 1415  BP: (!) 107/43 (!) 108/41  Pulse: 74 65  Resp: 17 17  Temp: 98.2 F (36.8 C) 97.9 F (36.6 C)  SpO2: 98% 97%   Physical Exam: Cardiac:  RRR, No murmurs to note. Lungs:  Clear throughout on auscultation. No rales rhonchi or wheezing to note. Incisions:  Right groin incision with dressing clean dry and intact.  Extremities:  Left lower extremity remains warm to touch and palpable DP pulse.  Abdomen:  Positive bowel sounds throughout, soft, non tender and non distended Neurologic: AAOX3, Answers questions and follows commands appropriately.   CBC    Component Value Date/Time   WBC 13.6 (H) 05/03/2023 2222   RBC 3.29 (L) 05/03/2023 2222   HGB 10.0 (L) 05/03/2023 2222   HGB 9.5 (L) 11/02/2014 0609   HCT 28.6 (L) 05/03/2023 2222   HCT 29.5 (L) 11/02/2014 0609   PLT 280 05/03/2023 2222   PLT 217 11/02/2014 0609   MCV 86.9  05/03/2023 2222   MCV 87 11/02/2014 0609   MCH 30.4 05/03/2023 2222   MCHC 35.0 05/03/2023 2222   RDW 13.6 05/03/2023 2222   RDW 13.6 11/02/2014 0609   LYMPHSABS 1.3 09/08/2022 1044   LYMPHSABS 1.2 11/02/2014 0609   MONOABS 0.7 09/08/2022 1044   MONOABS 1.1 (H) 11/02/2014 0609   EOSABS 0.1 09/08/2022 1044   EOSABS 0.3 11/02/2014 0609   BASOSABS 0.1 09/08/2022 1044   BASOSABS 0.0 11/02/2014 0609    BMET    Component Value Date/Time   NA 139 05/04/2023 0558   NA 136 08/02/2021 1629   NA 135 11/02/2014 0609   K 4.3 05/04/2023 0558   K 4.2 11/02/2014 0609   CL 109 05/04/2023 0558   CL 107 11/02/2014 0609   CO2 21 (L) 05/04/2023 0558   CO2 23 11/02/2014 0609   GLUCOSE 306 (H) 05/04/2023 0558   GLUCOSE 108 (H) 11/02/2014 0609   BUN 26 (H) 05/04/2023 0558   BUN 25 08/02/2021 1629   BUN 20 11/02/2014 0609   CREATININE 0.67 05/04/2023 0558   CREATININE 1.01 11/09/2015 1549   CREATININE 1.01 11/09/2015 1549   CALCIUM 8.6 (L) 05/04/2023 0558   CALCIUM 7.3 (L) 11/02/2014 0609   GFRNONAA >60 05/04/2023 0558   GFRNONAA 50 (L) 11/02/2014 0609   GFRAA >60 01/19/2019 0357   GFRAA 58 (L) 11/02/2014 0609    INR    Component Value Date/Time   INR 0.9 10/17/2014 1131  Intake/Output Summary (Last 24 hours) at 05/08/2023 1500 Last data filed at 05/07/2023 2133 Gross per 24 hour  Intake --  Output 600 ml  Net -600 ml     Assessment/Plan:  62 y.o. female is s/p  POD #1 from a transmetatarsal left lower extremity amputation and POD #5 from Percutaneous transluminal angioplasty and stent placement left common femoral artery, left anterior tibial artery with Mechanical thrombectomy of the distal common femoral artery and femoral distal bypass graft.  3 Days Post-Op   PLAN: Patient to remain nonweightbearing on left lower extremity at all times Patient to discharge to skilled nursing facility when available. Pain medication as needed Continue PT OT. Patient to be discharged on  ASA 81 mg daily, Plavix 75 mg daily, and Eliquis 2.5 mg twice daily. Patient also to be discharged on Keflex 3 times a day for 7 days.  DVT prophylaxis:  ASA 81 mg daily, Plavix 75 mg daily, and Eliquis 2.5 mg twice daily.   Marcie Bal [facility] 05/08/2023 3:00 PM

## 2023-05-08 NOTE — Progress Notes (Signed)
Assessed the patient earlier in the shift, noticed that the patients wound vac was powered off and the dressing was inflated with air. Changed the wound vac batteries and attempted to turn the vac back on. Was unable to restart the wound vac, after much trial and error and researching what code the lights and beeps were indicating. Found out the wound vac 8 day lifecycle has expired and would not restart. Paged NP Sheppard Plumber, informed NP of situation. NP recommended to remove wound vac dressing, clean the area with betadine, and place new honeycomb dressing on the wound site. Honeycomb dressing not available on the unit, but did create a dressing with openings to visualize the wound underneath it. Wound looked appropriate, approximated with sutures, no redness or edema, no drainage or odor.

## 2023-05-08 NOTE — TOC CM/SW Note (Signed)
CSW was contacted via chat by the bedside nurse to review the patient's disposition and provide support per inquiry by the patient's daughter.   This Clinical research associate met with the patient daughter at the bedside of the patient. CSW introduced himself and worked and processed with the daughter and patient. Patient and daughter expresed SNF preference [facility]. The daughter noted she emailed the SNF regarding the patient's preference and would be open to pay for the SNF stay of insurance was a problem.   This Clinical research associate was in contact with Engineer, manufacturing. M who noted the patient 's referral ha been accepted and a bed will be ready around Tuesday (05/12/23). This Clinical research associate noted he will update the patient's chart and patient and daughter to the above information. This Clinical research associate updated the daughter with the patient, who expressed being pleased with the outcome and woul wait for the SNF transfer next week.  No other needs identified of this Clinical research associate. Please contact TOC if there is additional disposition support needed.

## 2023-05-08 NOTE — Inpatient Diabetes Management (Signed)
Inpatient Diabetes Program Recommendations  AACE/ADA: New Consensus Statement on Inpatient Glycemic Control (2015)  Target Ranges:  Prepandial:   less than 140 mg/dL      Peak postprandial:   less than 180 mg/dL (1-2 hours)      Critically ill patients:  140 - 180 mg/dL   Lab Results  Component Value Date   GLUCAP 302 (H) 05/08/2023   HGBA1C 8.0 (H) 01/27/2023    Review of Glycemic Control  Latest Reference Range & Units 05/07/23 07:59 05/07/23 11:56 05/07/23 16:00 05/07/23 20:47 05/08/23 07:40  Glucose-Capillary 70 - 99 mg/dL 829 (H) 562 (H) 130 (H) 271 (H) 302 (H)  (H): Data is abnormally high  Diabetes history: DM Outpatient Diabetes medications:  Tresiba 20 units every day Humalog 10 units TID Jardiance 25 mg QD Current orders for Inpatient glycemic control:  Novolog 0-15 units TID  Inpatient Diabetes Program Recommendations:    Please consider:  Semglee 15 units every day  Will continue to follow while inpatient.  Thank you, Dulce Sellar, MSN, CDCES Diabetes Coordinator Inpatient Diabetes Program [phone] (team pager from 8a-5p)

## 2023-05-08 NOTE — Progress Notes (Signed)
Physical Therapy Treatment Patient Details Name: Laura Reed MRN: [id number] DOB: 04/09/1961 Today's Date: 05/08/2023   History of Present Illness 62 y/o female admitted s/p mechanical thrombectomy of L common femoral and profunda femoris and L femoral-popliteal bypass with stent placement on 04/24/23. S/p bypass graft femoral-popliteal artery on 04/29/23. S/p Transmetatarsal amputation left foot on 05/05/23.  PMH: HTN, CHF, CAD, PVD    PT Comments  Pt was pleasant and motivated to participate during the session and put forth good effort throughout. Pt showing improvements in overall mobility compared to prior sessions, now able to perform bed mobility at supervision level when provided with consistent cues. Once sitting EOB, pt able to perform x3 STS's from elevated surface with min A +2 for initial boost, PT providing physical assist to promote NWB on LLE. When standing, pt able to heel-toe shuffle on RLE with heavy cues for sequencing and RW management for ~1 foot distance. Pt was additionally able to perform low amplitude hoping, but covered minimal distance doing so needing Min A to perform all standing activity. Pt educated on and demonstrated various resisted exercises per below in order to maintain LLE muscle activation and functional strength. Pt will benefit from continued PT services upon discharge to safely address deficits listed in patient problem list for decreased caregiver assistance and eventual return to PLOF.      If plan is discharge home, recommend the following: A lot of help with walking and/or transfers;A little help with bathing/dressing/bathroom;Assist for transportation;Help with stairs or ramp for entrance   Can travel by private vehicle        Equipment Recommendations  Rolling walker (2 wheels);BSC/3in1;Wheelchair (measurements PT)    Recommendations for Other Services       Precautions / Restrictions Precautions Precautions: Fall Restrictions Weight  Bearing Restrictions: Yes LLE Weight Bearing: Non weight bearing     Mobility  Bed Mobility Overal bed mobility: Needs Assistance Bed Mobility: Supine to Sit, Sit to Supine     Supine to sit: HOB elevated, Used rails, Supervision Sit to supine: HOB elevated, Supervision   General bed mobility comments: Supervision for bed mobility performed, no physical assist but heavy multimodal cues to successfully complete    Transfers Overall transfer level: Needs assistance Equipment used: Rolling walker (2 wheels)   Sit to Stand: +2 physical assistance, From elevated surface, Min assist           General transfer comment: Performed x3 STS's from very elevated surface, constant tactile cue given at LLE via support to maintain NWB status, pt performed with self selected strategy, needing assist for initial boost.    Ambulation/Gait Ambulation/Gait assistance: Min assist, +2 physical assistance   Assistive device: Rolling walker (2 wheels) Gait Pattern/deviations: Shuffle       General Gait Details: Once standing pt able to perform heel/toe shuffle for ~1 foot on RLE while PT assisting with NWB on LLE, VC's and demonstration needed for sequencing and RW management. With continued cues pt was able to perofrm a few hops but minimal advancement of RLE seen.   Stairs             Wheelchair Mobility     Tilt Bed    Modified Rankin (Stroke Patients Only)       Balance Overall balance assessment: Needs assistance Sitting-balance support: No upper extremity supported, Feet supported Sitting balance-Leahy Scale: Good     Standing balance support: Bilateral upper extremity supported, During functional activity, Reliant on assistive device for balance  Standing balance-Leahy Scale: Poor Standing balance comment: requires external support to maintain                            Cognition Arousal: Alert Behavior During Therapy: Anxious Overall Cognitive Status:  Within Functional Limits for tasks assessed                                 General Comments: Continue multi modal cues with repition for task sequencing        Exercises Total Joint Exercises Short Arc Quad: Left, 10 reps, AROM (hand resistance) Heel Slides: Left, 10 reps, AAROM (hand resistance, maintaining NWB) Straight Leg Raises: Left, 10 reps, AROM (resisted) Other Exercises Other Exercises: educated HEP and importance of exercises to maintain LE strength    General Comments General comments (skin integrity, edema, etc.): Lowest SpO2 at 95% on RA      Pertinent Vitals/Pain Pain Assessment Pain Assessment: Faces Faces Pain Scale: Hurts a little bit Pain Location: incision/ LLE below L knee Pain Descriptors / Indicators: Aching Pain Intervention(s): Monitored during session    Home Living                          Prior Function            PT Goals (current goals can now be found in the care plan section) Progress towards PT goals: Progressing toward goals    Frequency    Min 1X/week      PT Plan      Co-evaluation              AM-PAC PT "6 Clicks" Mobility   Outcome Measure  Help needed turning from your back to your side while in a flat bed without using bedrails?: A Little Help needed moving from lying on your back to sitting on the side of a flat bed without using bedrails?: A Little Help needed moving to and from a bed to a chair (including a wheelchair)?: A Lot Help needed standing up from a chair using your arms (e.g., wheelchair or bedside chair)?: A Little Help needed to walk in hospital room?: Total Help needed climbing 3-5 steps with a railing? : Total 6 Click Score: 13    End of Session Equipment Utilized During Treatment: Gait belt Activity Tolerance: Patient tolerated treatment well Patient left: in bed;with call bell/phone within reach;with bed alarm set;with family/visitor present Nurse Communication:  Mobility status PT Visit Diagnosis: Unsteadiness on feet (R26.81);Muscle weakness (generalized) (M62.81);Difficulty in walking, not elsewhere classified (R26.2)     Time: 1610-9604 PT Time Calculation (min) (ACUTE ONLY): 46 min  Charges:                            Cecile Sheerer, SPT 05/08/23, 4:32 PM

## 2023-05-08 NOTE — TOC Progression Note (Signed)
Transition of Care [facility]) - Progression Note    Patient Details  Name: Laura Reed MRN: [id number] Date of Birth: [birth year]  [facility] CM/SW Contact  Truddie Hidden, RN Phone Number: 05/08/2023, 10:13 AM  Clinical Narrative:    Spoke with patient at bedside to discuss bed offer. She was given bed offers for [facility], [facility], [place], and [facility]. She has been advised [facility] has not made a bed offer at this time. She was also advised [facility] is OON. RNCM advised of CMS guideline at time of discharge and the right to appeal if desired.    Expected Discharge Plan: Home w Home Health Services    Expected Discharge Plan and Services     Post Acute Care Choice: Home Health Living arrangements for the past 2 months: Apartment                                       Social Determinants of Health (SDOH) Interventions SDOH Screenings   Food Insecurity: No Food Insecurity (04/24/2023)  Housing: Low Risk  (04/24/2023)  Transportation Needs: No Transportation Needs (04/24/2023)  Utilities: Not At Risk (04/24/2023)  Alcohol Screen: Low Risk  (09/27/2020)  Depression (PHQ2-9): Low Risk  (09/08/2022)  Financial Resource Strain: Low Risk  (06/12/2022)  Physical Activity: Inactive (06/10/2021)  Social Connections: Moderately Isolated (06/12/2022)  Stress: No Stress Concern Present (06/12/2022)  Tobacco Use: Medium Risk (05/05/2023)    Readmission Risk Interventions     No data to display

## 2023-05-08 NOTE — Care Management Important Message (Signed)
Important Message  Patient Details  Name: Laura Reed MRN: [id number] Date of Birth: [birth year]   Important Message Given:  Yes - Medicare IM     Laura Reed 05/08/2023, 3:51 PM

## 2023-05-09 LAB — GLUCOSE, CAPILLARY
Glucose-Capillary: 132 mg/dL — ABNORMAL HIGH (ref 70–99)
Glucose-Capillary: 101 mg/dL — ABNORMAL HIGH (ref 70–99)
Glucose-Capillary: 142 mg/dL — ABNORMAL HIGH (ref 70–99)

## 2023-05-09 NOTE — Progress Notes (Signed)
Physical Therapy Treatment Patient Details Name: Laura Reed MRN: [id number] DOB: 1960/07/16 Today's Date: 05/09/2023   History of Present Illness 62 y/o female admitted s/p mechanical thrombectomy of L common femoral and profunda femoris and L femoral-popliteal bypass with stent placement on 04/24/23. S/p bypass graft femoral-popliteal artery on 04/29/23. S/p Transmetatarsal amputation left foot on 05/05/23.  PMH: HTN, CHF, CAD, PVD    PT Comments  Pt was pleasant and motivated to participate during the session and put forth good effort throughout. Opted to provide extensive resisted and non-resisted exercises supine in bed and seated EOB. Pt educated on general LE's HEP packet and the benefits of frequent mobilization. Pt also educated on verbalizing repetitions in order to prevent/minimize valsalva. Pt will benefit from continued PT services upon discharge to safely address deficits listed in patient problem list for decreased caregiver assistance and eventual return to PLOF.    If plan is discharge home, recommend the following: A lot of help with walking and/or transfers;A little help with bathing/dressing/bathroom;Assist for transportation;Help with stairs or ramp for entrance   Can travel by private vehicle        Equipment Recommendations  Rolling walker (2 wheels);BSC/3in1;Wheelchair (measurements PT)    Recommendations for Other Services       Precautions / Restrictions Precautions Precautions: Fall Restrictions Weight Bearing Restrictions: Yes LLE Weight Bearing: Non weight bearing     Mobility  Bed Mobility Overal bed mobility: Needs Assistance Bed Mobility: Supine to Sit, Sit to Supine     Supine to sit: HOB elevated, Used rails, Supervision Sit to supine: HOB elevated, Supervision   General bed mobility comments: Supervision for bed mobility performed, needing mod cues for sequencing to complete.    Transfers                   General transfer  comment: Deferred session to performing various resisted and non-resisted exercises for both LE's to maintina functinoal strength    Ambulation/Gait                   Stairs             Wheelchair Mobility     Tilt Bed    Modified Rankin (Stroke Patients Only)       Balance Overall balance assessment: Needs assistance Sitting-balance support: No upper extremity supported, Feet supported Sitting balance-Leahy Scale: Normal         Standing balance comment: NT today                            Cognition Arousal: Alert Behavior During Therapy: Anxious, WFL for tasks assessed/performed Overall Cognitive Status: Within Functional Limits for tasks assessed                                          Exercises Total Joint Exercises Ankle Circles/Pumps: AROM, Both, 10 reps, Strengthening Heel Slides: Left, 10 reps, AAROM, Strengthening (resisted) Hip ABduction/ADduction: 10 reps, AROM, Left (intermittent resistance) Long Arc Quad: Left, AROM, 10 reps, Strengthening (10x1 non resisted, 10x1 with intermittent hand resistance) Knee Flexion: AROM, Left, 10 reps (resisted) Other Exercises Other Exercises: Pt given HEP packet and educated on the benefits of frequent exercises to maintain functional strength    General Comments        Pertinent Vitals/Pain Pain Assessment Pain Assessment: 0-10  Pain Score: 2  Pain Location: incision/ LLE below L knee Pain Descriptors / Indicators: Aching Pain Intervention(s): Monitored during session    Home Living                          Prior Function            PT Goals (current goals can now be found in the care plan section) Progress towards PT goals: Progressing toward goals    Frequency    Min 1X/week      PT Plan      Co-evaluation              AM-PAC PT "6 Clicks" Mobility   Outcome Measure  Help needed turning from your back to your side while in a flat  bed without using bedrails?: A Little Help needed moving from lying on your back to sitting on the side of a flat bed without using bedrails?: A Little Help needed moving to and from a bed to a chair (including a wheelchair)?: A Lot Help needed standing up from a chair using your arms (e.g., wheelchair or bedside chair)?: A Little Help needed to walk in hospital room?: Total Help needed climbing 3-5 steps with a railing? : Total 6 Click Score: 13    End of Session   Activity Tolerance: Patient tolerated treatment well Patient left: in bed;with call bell/phone within reach;with bed alarm set;with family/visitor present Nurse Communication: Mobility status PT Visit Diagnosis: Unsteadiness on feet (R26.81);Muscle weakness (generalized) (M62.81);Difficulty in walking, not elsewhere classified (R26.2)     Time: 1478-2956 PT Time Calculation (min) (ACUTE ONLY): 26 min  Charges:                            Cecile Sheerer, SPT 05/09/23, 4:56 PM

## 2023-05-09 NOTE — Progress Notes (Signed)
Dressing change performed today. Incision Well coapted.  No dehiscence. Skin flaps well-perfused. Dry bandages applied today. Instructed patient on continue nonweightbearing to the left foot. Will have skilled nurse facility perform dressing changes twice a week for the next couple weeks.  Orders placed in discharge instructions.  She can follow-up in the outpatient clinic with me in 2 weeks.

## 2023-05-09 NOTE — Progress Notes (Signed)
VASC[facility] VEIN SPECIALISTS OF [place] PROGRESS NOTE  ASSESSMENT / PLAN: Laura Reed is a 62 y.o. female status post left lower extremity peripheral intervention and transmetatarsal amputation. Overall doing well. Mobilize as able. Awaiting disposition to SNF.   SUBJECTIVE: No complaints.   OBJECTIVE: BP (!) 110/43 (BP Location: Right Arm)   Pulse 70   Temp 97.7 F (36.5 C)   Resp 16   Ht 5\' 7"  (1.702 m)   Wt 61.2 kg   SpO2 96%   BMI 21.14 kg/m   Intake/Output Summary (Last 24 hours) at 05/09/2023 1513 Last data filed at 05/08/2023 1754 Gross per 24 hour  Intake --  Output 600 ml  Net -600 ml    LLE warm and well perfused L TMA bandage clean     Latest Ref Rng & Units 05/03/2023   10:22 PM 05/03/2023    5:40 AM 05/02/2023    3:19 AM  CBC  WBC 4.0 - 10.5 K/uL 13.6  13.9  12.5   Hemoglobin 12.0 - 15.0 g/dL 16.1  09.6  04.5   Hematocrit 36.0 - 46.0 % 28.6  30.3  31.8   Platelets 150 - 400 K/uL 280  258  225         Latest Ref Rng & Units 05/04/2023    5:58 AM 05/03/2023   10:22 PM 04/30/2023    1:14 AM  CMP  Glucose 70 - 99 mg/dL 409  811  914   BUN 8 - 23 mg/dL 26  28  19    Creatinine 0.44 - 1.00 mg/dL 7.82  9.56  2.13   Sodium 135 - 145 mmol/L 139  137  136   Potassium 3.5 - 5.1 mmol/L 4.3  3.3  4.2   Chloride 98 - 111 mmol/L 109  107  108   CO2 22 - 32 mmol/L 21  23  21    Calcium 8.9 - 10.3 mg/dL 8.6  8.3  7.6   Total Protein 6.5 - 8.1 g/dL 5.5  5.3    Total Bilirubin 0.3 - 1.2 mg/dL 0.9  0.8    Alkaline Phos 38 - 126 U/L 55  57    AST 15 - 41 U/L 15  12    ALT 0 - 44 U/L 13  11      Estimated Creatinine Clearance: 68.6 mL/min (by C-G formula based on SCr of 0.67 mg/dL).  Rande Brunt. Lenell Antu, MD [facility] V[facility] and Ve[facility] Specialists of [facility] Phone Number: [phone] 05/09/2023 3:13 PM

## 2023-05-10 LAB — AEROBIC/ANAEROBIC CULTURE W GRAM STAIN (SURGICAL/DEEP WOUND)

## 2023-05-10 LAB — GLUCOSE, CAPILLARY
Glucose-Capillary: 152 mg/dL — ABNORMAL HIGH (ref 70–99)
Glucose-Capillary: 158 mg/dL — ABNORMAL HIGH (ref 70–99)
Glucose-Capillary: 169 mg/dL — ABNORMAL HIGH (ref 70–99)
Glucose-Capillary: 239 mg/dL — ABNORMAL HIGH (ref 70–99)
Glucose-Capillary: 155 mg/dL — ABNORMAL HIGH (ref 70–99)

## 2023-05-10 LAB — CBC
HCT: 27.7 % — ABNORMAL LOW (ref 36.0–46.0)
Hemoglobin: 9.6 g/dL — ABNORMAL LOW (ref 12.0–15.0)
MCH: 30.8 pg (ref 26.0–34.0)
MCHC: 34.7 g/dL (ref 30.0–36.0)
MCV: 88.8 fL (ref 80.0–100.0)
Platelets: 429 10*3/uL — ABNORMAL HIGH (ref 150–400)
RBC: 3.12 MIL/uL — ABNORMAL LOW (ref 3.87–5.11)
RDW: 13.8 % (ref 11.5–15.5)
WBC: 13.7 10*3/uL — ABNORMAL HIGH (ref 4.0–10.5)
nRBC: 0 % (ref 0.0–0.2)

## 2023-05-10 NOTE — Plan of Care (Signed)
  Problem: Education: Goal: Knowledge of General Education information will improve Description: Including pain rating scale, medication(s)/side effects and non-pharmacologic comfort measures Outcome: Progressing   Problem: Health Behavior/Discharge Planning: Goal: Ability to manage health-related needs will improve Outcome: Progressing   Problem: Clinical Measurements: Goal: Ability to maintain clinical measurements within normal limits will improve Outcome: Progressing Goal: Will remain free from infection Outcome: Progressing Goal: Diagnostic test results will improve Outcome: Progressing Goal: Respiratory complications will improve Outcome: Progressing Goal: Cardiovascular complication will be avoided Outcome: Progressing   Problem: Activity: Goal: Risk for activity intolerance will decrease Outcome: Progressing   Problem: Nutrition: Goal: Adequate nutrition will be maintained Outcome: Progressing   Problem: Coping: Goal: Level of anxiety will decrease Outcome: Progressing   Problem: Elimination: Goal: Will not experience complications related to bowel motility Outcome: Progressing Goal: Will not experience complications related to urinary retention Outcome: Progressing   Problem: Pain Managment: Goal: General experience of comfort will improve Outcome: Progressing   Problem: Safety: Goal: Ability to remain free from injury will improve Outcome: Progressing   Problem: Skin Integrity: Goal: Risk for impaired skin integrity will decrease Outcome: Progressing   Problem: Education: Goal: Ability to describe self-care measures that may prevent or decrease complications (Diabetes Survival Skills Education) will improve Outcome: Progressing Goal: Individualized Educational Video(s) Outcome: Progressing   Problem: Coping: Goal: Ability to adjust to condition or change in health will improve Outcome: Progressing   Problem: Fluid Volume: Goal: Ability to  maintain a balanced intake and output will improve Outcome: Progressing   Problem: Health Behavior/Discharge Planning: Goal: Ability to identify and utilize available resources and services will improve Outcome: Progressing Goal: Ability to manage health-related needs will improve Outcome: Progressing   Problem: Metabolic: Goal: Ability to maintain appropriate glucose levels will improve Outcome: Progressing   Problem: Nutritional: Goal: Maintenance of adequate nutrition will improve Outcome: Progressing Goal: Progress toward achieving an optimal weight will improve Outcome: Progressing   Problem: Skin Integrity: Goal: Risk for impaired skin integrity will decrease Outcome: Progressing   Problem: Tissue Perfusion: Goal: Adequacy of tissue perfusion will improve Outcome: Progressing   Problem: Education: Goal: Knowledge of prescribed regimen will improve Outcome: Progressing   Problem: Activity: Goal: Ability to tolerate increased activity will improve Outcome: Progressing   Problem: Bowel/Gastric: Goal: Gastrointestinal status for postoperative course will improve Outcome: Progressing   Problem: Clinical Measurements: Goal: Postoperative complications will be avoided or minimized Outcome: Progressing Goal: Signs and symptoms of graft occlusion will improve Outcome: Progressing   Problem: Skin Integrity: Goal: Demonstration of wound healing without infection will improve Outcome: Progressing   Problem: Education: Goal: Understanding of CV disease, CV risk reduction, and recovery process will improve Outcome: Progressing Goal: Individualized Educational Video(s) Outcome: Progressing   Problem: Activity: Goal: Ability to return to baseline activity level will improve Outcome: Progressing   Problem: Cardiovascular: Goal: Ability to achieve and maintain adequate cardiovascular perfusion will improve Outcome: Progressing Goal: Vascular access site(s) Level 0-1  will be maintained Outcome: Progressing   Problem: Health Behavior/Discharge Planning: Goal: Ability to safely manage health-related needs after discharge will improve Outcome: Progressing

## 2023-05-10 NOTE — Plan of Care (Signed)
progressing 

## 2023-05-10 NOTE — Progress Notes (Signed)
[facility] OF [place] PROGRESS NOTE  ASSESSMENT / PLAN: Laura Reed is a 62 y.o. female status post left lower extremity peripheral intervention and transmetatarsal amputation. Overall doing well. Mobilize as able. Awaiting disposition to SNF.   SUBJECTIVE: No complaints.   OBJECTIVE: BP 123/76 (BP Location: Right Arm)   Pulse 70   Temp 98.3 F (36.8 C)   Resp 16   Ht 5\' 7"  (1.702 m)   Wt 61.2 kg   SpO2 98%   BMI 21.14 kg/m   Intake/Output Summary (Last 24 hours) at 05/10/2023 1208 Last data filed at 05/09/2023 1500 Gross per 24 hour  Intake 240 ml  Output --  Net 240 ml    LLE warm and well perfused L TMA bandage clean     Latest Ref Rng & Units 05/10/2023    4:51 AM 05/03/2023   10:22 PM 05/03/2023    5:40 AM  CBC  WBC 4.0 - 10.5 K/uL 13.7  13.6  13.9   Hemoglobin 12.0 - 15.0 g/dL 9.6  59.5  63.8   Hematocrit 36.0 - 46.0 % 27.7  28.6  30.3   Platelets 150 - 400 K/uL 429  280  258         Latest Ref Rng & Units 05/04/2023    5:58 AM 05/03/2023   10:22 PM 04/30/2023    1:14 AM  CMP  Glucose 70 - 99 mg/dL 756  433  295   BUN 8 - 23 mg/dL 26  28  19    Creatinine 0.44 - 1.00 mg/dL 1.88  4.16  6.06   Sodium 135 - 145 mmol/L 139  137  136   Potassium 3.5 - 5.1 mmol/L 4.3  3.3  4.2   Chloride 98 - 111 mmol/L 109  107  108   CO2 22 - 32 mmol/L 21  23  21    Calcium 8.9 - 10.3 mg/dL 8.6  8.3  7.6   Total Protein 6.5 - 8.1 g/dL 5.5  5.3    Total Bilirubin 0.3 - 1.2 mg/dL 0.9  0.8    Alkaline Phos 38 - 126 U/L 55  57    AST 15 - 41 U/L 15  12    ALT 0 - 44 U/L 13  11      Estimated Creatinine Clearance: 68.6 mL/min (by C-G formula based on SCr of 0.67 mg/dL).  Rande Brunt. Lenell Antu, MD [facility] V[facility] and Ve[facility] Specialists of [facility] Phone Number: [phone] 05/10/2023 12:08 PM

## 2023-05-11 DIAGNOSIS — Z23 Encounter for immunization: Secondary | ICD-10-CM | POA: Diagnosis not present

## 2023-05-11 DIAGNOSIS — Z7982 Long term (current) use of aspirin: Secondary | ICD-10-CM | POA: Diagnosis not present

## 2023-05-11 DIAGNOSIS — I4891 Unspecified atrial fibrillation: Secondary | ICD-10-CM | POA: Diagnosis not present

## 2023-05-11 DIAGNOSIS — Z905 Acquired absence of kidney: Secondary | ICD-10-CM | POA: Diagnosis not present

## 2023-05-11 DIAGNOSIS — Y832 Surgical operation with anastomosis, bypass or graft as the cause of abnormal reaction of the patient, or of later complication, without mention of misadventure at the time of the procedure: Secondary | ICD-10-CM | POA: Diagnosis not present

## 2023-05-11 DIAGNOSIS — M1990 Unspecified osteoarthritis, unspecified site: Secondary | ICD-10-CM | POA: Diagnosis not present

## 2023-05-11 DIAGNOSIS — R601 Generalized edema: Secondary | ICD-10-CM | POA: Diagnosis not present

## 2023-05-11 DIAGNOSIS — K5900 Constipation, unspecified: Secondary | ICD-10-CM | POA: Diagnosis not present

## 2023-05-11 DIAGNOSIS — I110 Hypertensive heart disease with heart failure: Secondary | ICD-10-CM | POA: Diagnosis not present

## 2023-05-11 DIAGNOSIS — R609 Edema, unspecified: Secondary | ICD-10-CM | POA: Diagnosis not present

## 2023-05-11 DIAGNOSIS — I739 Peripheral vascular disease, unspecified: Secondary | ICD-10-CM | POA: Diagnosis not present

## 2023-05-11 DIAGNOSIS — Z7984 Long term (current) use of oral hypoglycemic drugs: Secondary | ICD-10-CM | POA: Diagnosis not present

## 2023-05-11 DIAGNOSIS — Z794 Long term (current) use of insulin: Secondary | ICD-10-CM | POA: Diagnosis not present

## 2023-05-11 DIAGNOSIS — Z743 Need for continuous supervision: Secondary | ICD-10-CM | POA: Diagnosis not present

## 2023-05-11 DIAGNOSIS — I10 Essential (primary) hypertension: Secondary | ICD-10-CM | POA: Diagnosis not present

## 2023-05-11 DIAGNOSIS — Z89432 Acquired absence of left foot: Secondary | ICD-10-CM | POA: Diagnosis not present

## 2023-05-11 DIAGNOSIS — I509 Heart failure, unspecified: Secondary | ICD-10-CM | POA: Diagnosis not present

## 2023-05-11 DIAGNOSIS — E559 Vitamin D deficiency, unspecified: Secondary | ICD-10-CM | POA: Diagnosis not present

## 2023-05-11 DIAGNOSIS — E7800 Pure hypercholesterolemia, unspecified: Secondary | ICD-10-CM | POA: Diagnosis not present

## 2023-05-11 DIAGNOSIS — M86172 Other acute osteomyelitis, left ankle and foot: Secondary | ICD-10-CM | POA: Diagnosis not present

## 2023-05-11 DIAGNOSIS — I2510 Atherosclerotic heart disease of native coronary artery without angina pectoris: Secondary | ICD-10-CM | POA: Diagnosis not present

## 2023-05-11 DIAGNOSIS — E1151 Type 2 diabetes mellitus with diabetic peripheral angiopathy without gangrene: Secondary | ICD-10-CM | POA: Diagnosis not present

## 2023-05-11 DIAGNOSIS — E1140 Type 2 diabetes mellitus with diabetic neuropathy, unspecified: Secondary | ICD-10-CM | POA: Diagnosis not present

## 2023-05-11 DIAGNOSIS — N390 Urinary tract infection, site not specified: Secondary | ICD-10-CM | POA: Diagnosis not present

## 2023-05-11 DIAGNOSIS — Z9889 Other specified postprocedural states: Secondary | ICD-10-CM | POA: Diagnosis not present

## 2023-05-11 DIAGNOSIS — Z7901 Long term (current) use of anticoagulants: Secondary | ICD-10-CM | POA: Diagnosis not present

## 2023-05-11 DIAGNOSIS — K219 Gastro-esophageal reflux disease without esophagitis: Secondary | ICD-10-CM | POA: Diagnosis not present

## 2023-05-11 DIAGNOSIS — E1142 Type 2 diabetes mellitus with diabetic polyneuropathy: Secondary | ICD-10-CM | POA: Diagnosis not present

## 2023-05-11 DIAGNOSIS — E1165 Type 2 diabetes mellitus with hyperglycemia: Secondary | ICD-10-CM | POA: Diagnosis not present

## 2023-05-11 DIAGNOSIS — R69 Illness, unspecified: Secondary | ICD-10-CM | POA: Diagnosis not present

## 2023-05-11 DIAGNOSIS — I70213 Atherosclerosis of native arteries of extremities with intermittent claudication, bilateral legs: Secondary | ICD-10-CM | POA: Diagnosis not present

## 2023-05-11 DIAGNOSIS — Z89422 Acquired absence of other left toe(s): Secondary | ICD-10-CM | POA: Diagnosis not present

## 2023-05-11 LAB — URINALYSIS, ROUTINE W REFLEX MICROSCOPIC
Bilirubin Urine: NEGATIVE
Glucose, UA: NEGATIVE mg/dL
Ketones, ur: 5 mg/dL — AB
Nitrite: POSITIVE — AB
Protein, ur: 100 mg/dL — AB
RBC / HPF: >50 RBC/hpf (ref 0–5)
Specific Gravity, Urine: 1.019 (ref 1.005–1.030)
WBC, UA: >50 WBC/hpf (ref 0–5)
pH: 5 (ref 5.0–8.0)

## 2023-05-11 LAB — GLUCOSE, CAPILLARY
Glucose-Capillary: 211 mg/dL — ABNORMAL HIGH (ref 70–99)
Glucose-Capillary: 169 mg/dL — ABNORMAL HIGH (ref 70–99)
Glucose-Capillary: 211 mg/dL — ABNORMAL HIGH (ref 70–99)

## 2023-05-11 MED ORDER — ALPRAZOLAM 0.5 MG PO TABS: 0.5000 mg | ORAL_TABLET | Freq: Two times a day (BID) | ORAL | 1 refills | Status: DC | PRN

## 2023-05-11 MED ORDER — CLOPIDOGREL BISULFATE 75 MG PO TABS: 75.0000 mg | ORAL_TABLET | Freq: Every day | ORAL | 11 refills | Status: DC

## 2023-05-11 MED ORDER — AMOXICILLIN-POT CLAVULANATE 875-125 MG PO TABS: 1.0000 | ORAL_TABLET | Freq: Two times a day (BID) | ORAL | 0 refills | Status: AC

## 2023-05-11 MED ORDER — SULFAMETHOXAZOLE-TRIMETHOPRIM 800-160 MG PO TABS: 1.0000 | ORAL_TABLET | Freq: Two times a day (BID) | ORAL | 0 refills | Status: DC

## 2023-05-11 MED ORDER — AMOXICILLIN-POT CLAVULANATE 875-125 MG PO TABS
1.0000 | ORAL_TABLET | Freq: Two times a day (BID) | ORAL | Status: DC
  Administered 2023-05-11: 1 via ORAL
  Filled 2023-05-11: qty 1

## 2023-05-11 MED ORDER — AMOXICILLIN-POT CLAVULANATE 875-125 MG PO TABS: 1.0000 | ORAL_TABLET | Freq: Two times a day (BID) | ORAL | 0 refills | Status: DC

## 2023-05-11 MED ORDER — SULFAMETHOXAZOLE-TRIMETHOPRIM 800-160 MG PO TABS
1.0000 | ORAL_TABLET | Freq: Two times a day (BID) | ORAL | Status: DC
  Filled 2023-05-11: qty 1

## 2023-05-11 MED ORDER — APIXABAN 2.5 MG PO TABS: 2.5000 mg | ORAL_TABLET | Freq: Two times a day (BID) | ORAL | 11 refills | Status: DC

## 2023-05-11 NOTE — TOC Transition Note (Signed)
Transition of Care [facility]) - CM/SW Discharge Note   Patient Details  Name: Laura Reed MRN: [id number] Date of Birth: [birth year]  Transition of Care [facility]) CM/SW Contact:  Margarito Liner, LCSW Phone Number: 05/11/2023, 2:27 PM   Clinical Narrative:   Patient has orders to discharge to [facility] today. RN will call report to [phone] (Room 513). EMS transport set up for 3:00. Vascular will add PO abx to discharge summary and notify CSW when the update is in. SNF admissions coordinator is aware. No further concerns. CSW signing off.  Final next level of care: Skilled Nursing Facility Barriers to Discharge: Barriers Resolved   Patient Goals and CMS Choice CMS Medicare.gov Compare Post Acute Care list provided to:: Patient Choice offered to / list presented to : Patient, Sibling  Discharge Placement PASRR number recieved: 05/06/23 PASRR number recieved: 05/06/23            Patient chooses bed at: [facility] Patient to be transferred to facility by: EMS Name of family member notified: Jasmine December Dent Patient and family notified of of transfer: 05/11/23  Discharge Plan and Services Additional resources added to the After Visit Summary for       Post Acute Care Choice: Home Health                               Social Determinants of Health (SDOH) Interventions SDOH Screenings   Food Insecurity: No Food Insecurity (04/24/2023)  Housing: Low Risk  (04/24/2023)  Transportation Needs: No Transportation Needs (04/24/2023)  Utilities: Not At Risk (04/24/2023)  Alcohol Screen: Low Risk  (09/27/2020)  Depression (PHQ2-9): Low Risk  (09/08/2022)  Financial Resource Strain: Low Risk  (06/12/2022)  Physical Activity: Inactive (06/10/2021)  Social Connections: Moderately Isolated (06/12/2022)  Stress: No Stress Concern Present (06/12/2022)  Tobacco Use: Medium Risk (05/05/2023)     Readmission Risk Interventions     No data to display

## 2023-05-11 NOTE — Care Management Important Message (Signed)
Important Message  Patient Details  Name: Laura Reed MRN: [id number] Date of Birth: 11/17/1960   Important Message Given:  Yes - Medicare IM     Olegario Messier A Santiago Graf 05/11/2023, 2:11 PM

## 2023-05-11 NOTE — TOC Progression Note (Addendum)
Transition of Care [facility]) - Progression Note    Patient Details  Name: Laura Reed MRN: [id number] Date of Birth: 1960/09/26  Transition of Care [facility] CM/SW Contact  Margarito Liner, LCSW Phone Number: 05/11/2023, 10:18 AM  Clinical Narrative:  [facility] can accept patient today if insurance approves. CSW started authorization. SNF will need discharge summary before 2:00 if admitting today. Vascular NP is aware.  11:00 am: CSW updated patient and son at bedside. CSW called and updated sister.  11:23 am: Auth approved. Auth number has not generated yet. Reference number is [id number]. Valid 11/4-11/6. CSW left message for [facility] admissions coordinator to notify and see if we need to wait on the auth number to generate prior to putting in discharge.  1:30 pm: SNF admissions coordinator confirmed we do not need to wait on auth number.  Expected Discharge Plan: Home w Home Health Services    Expected Discharge Plan and Services     Post Acute Care Choice: Home Health Living arrangements for the past 2 months: Apartment                                       Social Determinants of Health (SDOH) Interventions SDOH Screenings   Food Insecurity: No Food Insecurity (04/24/2023)  Housing: Low Risk  (04/24/2023)  Transportation Needs: No Transportation Needs (04/24/2023)  Utilities: Not At Risk (04/24/2023)  Alcohol Screen: Low Risk  (09/27/2020)  Depression (PHQ2-9): Low Risk  (09/08/2022)  Financial Resource Strain: Low Risk  (06/12/2022)  Physical Activity: Inactive (06/10/2021)  Social Connections: Moderately Isolated (06/12/2022)  Stress: No Stress Concern Present (06/12/2022)  Tobacco Use: Medium Risk (05/05/2023)    Readmission Risk Interventions     No data to display

## 2023-05-11 NOTE — Consult Note (Signed)
[facility] Liaison Note  05/11/2023  Laura Reed 09/25/1960 [id number]  Location: RN Hospital Liaison screened the patient remotely at [facility].  Insurance: [employer]   Laura Reed is a 62 y.o. female who is a Primary Care Patient of Sherlene Shams, MD The patient was screened for  readmission hospitalization with noted low risk score for unplanned readmission risk with 1 IP in 6 months.  The patient was assessed for potential Care Management service needs for post hospital transition for care coordination. Review of patient's electronic medical record reveals patient was admitted for Atherosclerosis. Pt will discharged to SNF. Facility will continue to address pt's post hospital needs. Will collaborate with the PAC-RN concerning pt's disposition.  Plan: [facility] Liaison will continue to follow progress and disposition to asess for post hospital community care coordination/management needs.  Referral request for community care coordination: Will collaborate with PAC-RN on pt's discharge disposition for SNF level of care.  [facility] Care Management/Population Health does not replace or interfere with any arrangements made by the Inpatient Transition of Care team.   For questions contact:   Elliot Cousin, RN, [facility] Liaison    [facility] Population Health Office Hours MTWF  8:00 am-6:00 pm Direct Dial: [phone] mobile [phone] [Office toll free line] Office Hours are M-F 8:30 - 5 pm [email or web address]

## 2023-05-11 NOTE — Plan of Care (Signed)
Patietn with multiple incontinence episodes of bowel and bladder.  Bmx 2 overnight, wearing mesh underwear and pad, skin care products in use. Problem: Education: Goal: Knowledge of General Education information will improve Description: Including pain rating scale, medication(s)/side effects and non-pharmacologic comfort measures Outcome: Progressing   Problem: Health Behavior/Discharge Planning: Goal: Ability to manage health-related needs will improve Outcome: Progressing   Problem: Clinical Measurements: Goal: Ability to maintain clinical measurements within normal limits will improve Outcome: Progressing Goal: Will remain free from infection Outcome: Progressing Goal: Diagnostic test results will improve Outcome: Progressing Goal: Respiratory complications will improve Outcome: Progressing Goal: Cardiovascular complication will be avoided Outcome: Progressing   Problem: Activity: Goal: Risk for activity intolerance will decrease Outcome: Progressing   Problem: Nutrition: Goal: Adequate nutrition will be maintained Outcome: Progressing   Problem: Coping: Goal: Level of anxiety will decrease Outcome: Progressing   Problem: Elimination: Goal: Will not experience complications related to bowel motility Outcome: Progressing Goal: Will not experience complications related to urinary retention Outcome: Progressing   Problem: Pain Managment: Goal: General experience of comfort will improve Outcome: Progressing   Problem: Safety: Goal: Ability to remain free from injury will improve Outcome: Progressing   Problem: Skin Integrity: Goal: Risk for impaired skin integrity will decrease Outcome: Progressing   Problem: Education: Goal: Ability to describe self-care measures that may prevent or decrease complications (Diabetes Survival Skills Education) will improve Outcome: Progressing Goal: Individualized Educational Video(s) Outcome: Progressing   Problem:  Coping: Goal: Ability to adjust to condition or change in health will improve Outcome: Progressing   Problem: Fluid Volume: Goal: Ability to maintain a balanced intake and output will improve Outcome: Progressing   Problem: Health Behavior/Discharge Planning: Goal: Ability to identify and utilize available resources and services will improve Outcome: Progressing Goal: Ability to manage health-related needs will improve Outcome: Progressing   Problem: Metabolic: Goal: Ability to maintain appropriate glucose levels will improve Outcome: Progressing   Problem: Nutritional: Goal: Maintenance of adequate nutrition will improve Outcome: Progressing Goal: Progress toward achieving an optimal weight will improve Outcome: Progressing   Problem: Skin Integrity: Goal: Risk for impaired skin integrity will decrease Outcome: Progressing   Problem: Tissue Perfusion: Goal: Adequacy of tissue perfusion will improve Outcome: Progressing   Problem: Education: Goal: Knowledge of prescribed regimen will improve Outcome: Progressing   Problem: Activity: Goal: Ability to tolerate increased activity will improve Outcome: Progressing   Problem: Bowel/Gastric: Goal: Gastrointestinal status for postoperative course will improve Outcome: Progressing   Problem: Clinical Measurements: Goal: Postoperative complications will be avoided or minimized Outcome: Progressing Goal: Signs and symptoms of graft occlusion will improve Outcome: Progressing   Problem: Skin Integrity: Goal: Demonstration of wound healing without infection will improve Outcome: Progressing   Problem: Education: Goal: Understanding of CV disease, CV risk reduction, and recovery process will improve Outcome: Progressing Goal: Individualized Educational Video(s) Outcome: Progressing   Problem: Activity: Goal: Ability to return to baseline activity level will improve Outcome: Progressing   Problem:  Cardiovascular: Goal: Ability to achieve and maintain adequate cardiovascular perfusion will improve Outcome: Progressing Goal: Vascular access site(s) Level 0-1 will be maintained Outcome: Progressing   Problem: Health Behavior/Discharge Planning: Goal: Ability to safely manage health-related needs after discharge will improve Outcome: Progressing

## 2023-05-11 NOTE — Anesthesia Postprocedure Evaluation (Signed)
Anesthesia Post Note  Patient: Laura Reed  Procedure(s) Performed: TRANSMETATARSAL AMPUTATION LEFT (Left: Toe)  Patient location during evaluation: PACU Anesthesia Type: General Level of consciousness: awake and alert Pain management: pain level controlled Vital Signs Assessment: post-procedure vital signs reviewed and stable Respiratory status: spontaneous breathing, nonlabored ventilation, respiratory function stable and patient connected to nasal cannula oxygen Cardiovascular status: blood pressure returned to baseline and stable Postop Assessment: no apparent nausea or vomiting Anesthetic complications: no   No notable events documented.   Last Vitals:  Vitals:   05/10/23 1713 05/10/23 1927  BP: 113/70 123/61  Pulse: 73 66  Resp: 16 17  Temp: 36.7 C 36.8 C  SpO2: 97% 99%    Last Pain:  Vitals:   05/10/23 2300  TempSrc:   PainSc: 0-No pain                 Lenard Simmer

## 2023-05-11 NOTE — Discharge Instructions (Addendum)
Do not lift any thing heavy. Do not lift anything more than a gallon of milk until returning for follow up  DO NOT WEIGHT BEAR ON YOUR LEFT LOWER EXTREMITY PER PODIATRY'S ORDERS.  DRESSING CHANGES TO LEFT FOOT PER PODIATRY.   Do not drive until follow up in clinic.   Take all medications prescribed to you. You will need to take Aspirin 81 mg daily, Plavix 75 mg daily, and Eliquis 5 mg twice daily. Do not skip taking any of these medications as this may result in loss of blood flow.   Follow up as scheduled with Vein and Vascular Surgery.

## 2023-05-11 NOTE — Discharge Summary (Cosign Needed Addendum)
[facility]    Discharge Summary    Patient ID:  Laura Reed MRN: [id number] DOB/AGE: 1960/11/10 62 y.o.  Admit date: 04/24/2023 Discharge date: 05/11/2023 Date of Surgery: 05/05/2023 Surgeon: Surgeon(s): Gwyneth Revels, DPM  Admission Diagnosis: Atherosclerosis of artery of extremity with rest pain [facility]) [I70.229]  Discharge Diagnoses:  Atherosclerosis of artery of extremity with rest pain (HCC) [I70.229]  Secondary Diagnoses: Past Medical History:  Diagnosis Date   Absence of kidney    left   Anxiety    Arthritis    Bladder cancer (HCC)    CHF (congestive heart failure) (HCC)    Complication of anesthesia    BP HAS  RUN LOW AFTER SURGERY-LUNGS FILLED UP WITH FLUID AFTER  LEG STENT SURGERY    Coronary artery disease    Diabetes mellitus    Family history of adverse reaction to anesthesia    Sister - PONV   GERD (gastroesophageal reflux disease)    OCC TUMS   Heart murmur    Hemorrhoid    History of methicillin resistant staphylococcus aureus (MRSA) 2007   Hypertension    Neuropathy    PVD (peripheral vascular disease) (HCC)    Thyroid nodule    right   Urothelial carcinoma of kidney (HCC) 10/31/2014   INVASIVE UROTHELIAL CARCINOMA, LOW GRADE. T1, Nx.   Wears dentures    full upper and lower    Procedure(s): TRANSMETATARSAL AMPUTATION LEFT  Discharged Condition: good  HPI:  Laura Reed is now POD # 2 from a transmetatarsal left lower extremity amputation and POD #5 from Percutaneous transluminal angioplasty and stent placement left common femoral artery, left anterior tibial artery with Mechanical thrombectomy of the distal common femoral artery and femoral distal bypass graft. Patient is resting comfortably in bed. No complaints overnight and vitals all remain stable.  Patient is ambulating around the halls without weightbearing to her left lower extremity per podiatry's instruction.  Patient is eating well and voiding well.   Patient is medically stable to be discharged to skilled nursing facility today.  Patient is to be discharged on aspirin 81 mg daily, Plavix 75 mg daily, Eliquis 2.5 mg twice daily and Lipitor 20 mg daily.  Patient needs dual antiplatelet therapy as well as anticoagulation therapy due to previous vascular disease history. Patient also discharged on Augmentin 875-125 mg BID for 7 Days for UTI.   Hospital Course:  Laura Reed is a 63 y.o. female is S/P Left Extubated: POD # 0 Physical Exam:  Alert notes x3, no acute distress Face: Symmetrical.  Tongue is midline. Neck: Trachea is midline.  No swelling or bruising. Cardiovascular: Regular rate and rhythm Pulmonary: Clear to auscultation bilaterally Abdomen: Soft, nontender, nondistended Right groin access: Clean dry and intact.  No swelling or drainage noted Left groin access: Clean dry and intact.  No swelling or drainage noted Left lower extremity: Thigh soft.  Calf soft.  Extremities warm distally toes. Hard to palpate pedal pulses however the foot is warm is her good capillary refill. Right lower extremity: Thigh soft.  Calf soft.  Extremities warm distally toes. Hard to palpate pedal pulses however the foot is warm is her good capillary refill. Neurological: No deficits noted   Post-op wounds:  clean, dry, intact or healing well  Pt. Ambulating, voiding and taking PO diet without difficulty. Pt pain controlled with PO pain meds.  Labs:  As below  Complications: none  Consults:  Treatment Team:  Linus Galas, DPM  Significant  Diagnostic Studies: CBC Lab Results  Component Value Date   WBC 13.7 (H) 05/10/2023   HGB 9.6 (L) 05/10/2023   HCT 27.7 (L) 05/10/2023   MCV 88.8 05/10/2023   PLT 429 (H) 05/10/2023    BMET    Component Value Date/Time   NA 139 05/04/2023 0558   NA 136 08/02/2021 1629   NA 135 11/02/2014 0609   K 4.3 05/04/2023 0558   K 4.2 11/02/2014 0609   CL 109 05/04/2023 0558   CL 107 11/02/2014  0609   CO2 21 (L) 05/04/2023 0558   CO2 23 11/02/2014 0609   GLUCOSE 306 (H) 05/04/2023 0558   GLUCOSE 108 (H) 11/02/2014 0609   BUN 26 (H) 05/04/2023 0558   BUN 25 08/02/2021 1629   BUN 20 11/02/2014 0609   CREATININE 0.67 05/04/2023 0558   CREATININE 1.01 11/09/2015 1549   CREATININE 1.01 11/09/2015 1549   CALCIUM 8.6 (L) 05/04/2023 0558   CALCIUM 7.3 (L) 11/02/2014 0609   GFRNONAA >60 05/04/2023 0558   GFRNONAA 50 (L) 11/02/2014 0609   GFRAA >60 01/19/2019 0357   GFRAA 58 (L) 11/02/2014 0609   COAG Lab Results  Component Value Date   INR 0.9 10/17/2014   INR 1.1 01/19/2014   INR 0.9 12/06/2013     Disposition:  Discharge to :Rehab Discharge Instructions     Change dressing   Complete by: As directed    Cleanse wound with saline.  Apply Betadine to incision site.  Cover with bulky sterile dressing with 4 x 4's ABD and Kerlix and apply Ace wrap.   Twice weekly      Allergies as of 05/11/2023   No Known Allergies      Medication List     TAKE these medications    acetaminophen 500 MG tablet Commonly known as: TYLENOL Take 1,000 mg by mouth every 6 (six) hours as needed for mild pain or headache.   ALPRAZolam 0.5 MG tablet Commonly known as: XANAX Take 1 tablet (0.5 mg total) by mouth 2 (two) times daily as needed for anxiety. What changed: See the new instructions.   amLODipine 2.5 MG tablet Commonly known as: NORVASC TAKE 1 TABLET(2.5 MG) BY MOUTH DAILY   amoxicillin-clavulanate 875-125 MG tablet Commonly known as: AUGMENTIN Take 1 tablet by mouth 2 (two) times daily for 7 days.   amoxicillin-clavulanate 875-125 MG tablet Commonly known as: AUGMENTIN Take 1 tablet by mouth every 12 (twelve) hours.   apixaban 2.5 MG Tabs tablet Commonly known as: ELIQUIS Take 1 tablet (2.5 mg total) by mouth 2 (two) times daily.   aspirin 81 MG chewable tablet Chew 81 mg by mouth at bedtime.   atorvastatin 20 MG tablet Commonly known as: LIPITOR TAKE 1  TABLET(20 MG) BY MOUTH DAILY   BIOTIN PO Take 1 tablet by mouth daily.   clopidogrel 75 MG tablet Commonly known as: PLAVIX Take 1 tablet (75 mg total) by mouth daily at 6 (six) AM. Start taking on: May 12, 2023 What changed: See the new instructions.   empagliflozin 25 MG Tabs tablet Commonly known as: Jardiance TAKE 1 TABLET(25 MG) BY MOUTH DAILY BEFORE AND BREAKFAST   FreeStyle Libre 2 Sensor Misc Use to check sugar at least 4 times daily   gabapentin 800 MG tablet Commonly known as: NEURONTIN TAKE 1 TABLET(800 MG) BY MOUTH THREE TIMES DAILY   glucose blood test strip Use you check blood sugars up to four times daily.   insulin lispro 100 UNIT/ML KwikPen  Commonly known as: HumaLOG KwikPen ADMINISTER 10 UNITS UNDER THE SKIN THREE TIMES DAILY BEFORE MEALS What changed: additional instructions   losartan 100 MG tablet Commonly known as: COZAAR Take 1 tablet (100 mg total) by mouth daily.   multivitamin tablet Take 1 tablet by mouth daily.   multivitamin-lutein Caps capsule Take 1 capsule by mouth daily.   Pen Needles 32G X 4 MM Misc Use to take insulin daily   Tresiba FlexTouch 100 UNIT/ML FlexTouch Pen Generic drug: insulin degludec Inject 20 Units into the skin daily.   triamcinolone ointment 0.5 % Commonly known as: KENALOG Apply 1 Application topically 2 (two) times daily.   Vitamin D3 50 MCG (2000 UT) Tabs Take 2,000 Units by mouth daily after supper.               Discharge Care Instructions  (From admission, onward)           Start     Ordered   05/09/23 0000  Change dressing       Comments: Cleanse wound with saline.  Apply Betadine to incision site.  Cover with bulky sterile dressing with 4 x 4's ABD and Kerlix and apply Ace wrap.   05/09/23 0917           Verbal and written Discharge instructions given to the patient. Wound care per Discharge AVS  Contact information for follow-up providers     [name], Latina Craver, MD  Follow up in 2 week(s).   Specialties: Vascular Surgery, Cardiology, Radiology, Vascular Surgery Why: Ultrasound Left Lower extremity with ABI Contact information: [address] [place] [address] [phone]              Contact information for after-discharge care     Destination     [facility]erred [facility] .   Service: Skilled Nursing Contact information: [address] [place] [address] [phone]                     Signed: Marcie Bal, NP  05/11/2023, 12:43 PM

## 2023-05-13 DIAGNOSIS — K5900 Constipation, unspecified: Secondary | ICD-10-CM | POA: Diagnosis not present

## 2023-05-13 DIAGNOSIS — E559 Vitamin D deficiency, unspecified: Secondary | ICD-10-CM | POA: Diagnosis not present

## 2023-05-13 DIAGNOSIS — E7800 Pure hypercholesterolemia, unspecified: Secondary | ICD-10-CM | POA: Diagnosis not present

## 2023-05-13 DIAGNOSIS — E1165 Type 2 diabetes mellitus with hyperglycemia: Secondary | ICD-10-CM | POA: Diagnosis not present

## 2023-05-13 DIAGNOSIS — I2510 Atherosclerotic heart disease of native coronary artery without angina pectoris: Secondary | ICD-10-CM | POA: Diagnosis not present

## 2023-05-13 DIAGNOSIS — Z89432 Acquired absence of left foot: Secondary | ICD-10-CM | POA: Diagnosis not present

## 2023-05-13 DIAGNOSIS — N390 Urinary tract infection, site not specified: Secondary | ICD-10-CM | POA: Diagnosis not present

## 2023-05-13 DIAGNOSIS — I509 Heart failure, unspecified: Secondary | ICD-10-CM | POA: Diagnosis not present

## 2023-05-14 ENCOUNTER — Ambulatory Visit: Payer: 59 | Admitting: Internal Medicine

## 2023-05-15 DIAGNOSIS — E1165 Type 2 diabetes mellitus with hyperglycemia: Secondary | ICD-10-CM | POA: Diagnosis not present

## 2023-05-15 DIAGNOSIS — R609 Edema, unspecified: Secondary | ICD-10-CM | POA: Diagnosis not present

## 2023-05-15 DIAGNOSIS — N390 Urinary tract infection, site not specified: Secondary | ICD-10-CM | POA: Diagnosis not present

## 2023-05-15 DIAGNOSIS — K5900 Constipation, unspecified: Secondary | ICD-10-CM | POA: Diagnosis not present

## 2023-05-15 DIAGNOSIS — I10 Essential (primary) hypertension: Secondary | ICD-10-CM | POA: Diagnosis not present

## 2023-05-15 DIAGNOSIS — Z89432 Acquired absence of left foot: Secondary | ICD-10-CM | POA: Diagnosis not present

## 2023-05-15 DIAGNOSIS — I2510 Atherosclerotic heart disease of native coronary artery without angina pectoris: Secondary | ICD-10-CM | POA: Diagnosis not present

## 2023-05-18 ENCOUNTER — Encounter (INDEPENDENT_AMBULATORY_CARE_PROVIDER_SITE_OTHER): Payer: Self-pay | Admitting: Vascular Surgery

## 2023-05-18 DIAGNOSIS — R601 Generalized edema: Secondary | ICD-10-CM | POA: Diagnosis not present

## 2023-05-19 DIAGNOSIS — I10 Essential (primary) hypertension: Secondary | ICD-10-CM | POA: Diagnosis not present

## 2023-05-19 DIAGNOSIS — E7800 Pure hypercholesterolemia, unspecified: Secondary | ICD-10-CM | POA: Diagnosis not present

## 2023-05-19 DIAGNOSIS — I509 Heart failure, unspecified: Secondary | ICD-10-CM | POA: Diagnosis not present

## 2023-05-19 DIAGNOSIS — Z89432 Acquired absence of left foot: Secondary | ICD-10-CM | POA: Diagnosis not present

## 2023-05-19 DIAGNOSIS — E559 Vitamin D deficiency, unspecified: Secondary | ICD-10-CM | POA: Diagnosis not present

## 2023-05-19 DIAGNOSIS — I2510 Atherosclerotic heart disease of native coronary artery without angina pectoris: Secondary | ICD-10-CM | POA: Diagnosis not present

## 2023-05-19 DIAGNOSIS — K5900 Constipation, unspecified: Secondary | ICD-10-CM | POA: Diagnosis not present

## 2023-05-19 DIAGNOSIS — N390 Urinary tract infection, site not specified: Secondary | ICD-10-CM | POA: Diagnosis not present

## 2023-05-19 DIAGNOSIS — E1165 Type 2 diabetes mellitus with hyperglycemia: Secondary | ICD-10-CM | POA: Diagnosis not present

## 2023-05-19 LAB — HEMOGLOBIN A1C: Hemoglobin A1C: 5.8

## 2023-05-20 DIAGNOSIS — R609 Edema, unspecified: Secondary | ICD-10-CM | POA: Diagnosis not present

## 2023-05-20 DIAGNOSIS — I10 Essential (primary) hypertension: Secondary | ICD-10-CM | POA: Diagnosis not present

## 2023-05-20 DIAGNOSIS — Z89432 Acquired absence of left foot: Secondary | ICD-10-CM | POA: Diagnosis not present

## 2023-05-20 DIAGNOSIS — E1165 Type 2 diabetes mellitus with hyperglycemia: Secondary | ICD-10-CM | POA: Diagnosis not present

## 2023-05-22 ENCOUNTER — Other Ambulatory Visit (INDEPENDENT_AMBULATORY_CARE_PROVIDER_SITE_OTHER): Payer: Self-pay | Admitting: Vascular Surgery

## 2023-05-22 DIAGNOSIS — Z89432 Acquired absence of left foot: Secondary | ICD-10-CM | POA: Diagnosis not present

## 2023-05-22 DIAGNOSIS — E1165 Type 2 diabetes mellitus with hyperglycemia: Secondary | ICD-10-CM | POA: Diagnosis not present

## 2023-05-22 DIAGNOSIS — Z9889 Other specified postprocedural states: Secondary | ICD-10-CM

## 2023-05-22 DIAGNOSIS — I4891 Unspecified atrial fibrillation: Secondary | ICD-10-CM | POA: Diagnosis not present

## 2023-05-22 DIAGNOSIS — I10 Essential (primary) hypertension: Secondary | ICD-10-CM | POA: Diagnosis not present

## 2023-05-25 DIAGNOSIS — I509 Heart failure, unspecified: Secondary | ICD-10-CM | POA: Diagnosis not present

## 2023-05-25 DIAGNOSIS — Z89432 Acquired absence of left foot: Secondary | ICD-10-CM | POA: Diagnosis not present

## 2023-05-25 DIAGNOSIS — K5900 Constipation, unspecified: Secondary | ICD-10-CM | POA: Diagnosis not present

## 2023-05-25 DIAGNOSIS — I110 Hypertensive heart disease with heart failure: Secondary | ICD-10-CM | POA: Diagnosis not present

## 2023-05-25 DIAGNOSIS — I2510 Atherosclerotic heart disease of native coronary artery without angina pectoris: Secondary | ICD-10-CM | POA: Diagnosis not present

## 2023-05-25 DIAGNOSIS — E1165 Type 2 diabetes mellitus with hyperglycemia: Secondary | ICD-10-CM | POA: Diagnosis not present

## 2023-05-26 DIAGNOSIS — I739 Peripheral vascular disease, unspecified: Secondary | ICD-10-CM | POA: Diagnosis not present

## 2023-05-26 DIAGNOSIS — Z89432 Acquired absence of left foot: Secondary | ICD-10-CM | POA: Diagnosis not present

## 2023-05-27 DIAGNOSIS — K5900 Constipation, unspecified: Secondary | ICD-10-CM | POA: Diagnosis not present

## 2023-05-27 DIAGNOSIS — E1142 Type 2 diabetes mellitus with diabetic polyneuropathy: Secondary | ICD-10-CM | POA: Diagnosis not present

## 2023-05-27 DIAGNOSIS — I10 Essential (primary) hypertension: Secondary | ICD-10-CM | POA: Diagnosis not present

## 2023-05-27 DIAGNOSIS — Z89432 Acquired absence of left foot: Secondary | ICD-10-CM | POA: Diagnosis not present

## 2023-05-28 ENCOUNTER — Ambulatory Visit (INDEPENDENT_AMBULATORY_CARE_PROVIDER_SITE_OTHER): Payer: 59

## 2023-05-28 ENCOUNTER — Telehealth: Payer: Self-pay | Admitting: Internal Medicine

## 2023-05-28 ENCOUNTER — Ambulatory Visit (INDEPENDENT_AMBULATORY_CARE_PROVIDER_SITE_OTHER): Payer: 59 | Admitting: Nurse Practitioner

## 2023-05-28 ENCOUNTER — Encounter (INDEPENDENT_AMBULATORY_CARE_PROVIDER_SITE_OTHER): Payer: Self-pay | Admitting: Nurse Practitioner

## 2023-05-28 VITALS — BP 109/58 | HR 64 | Resp 18 | Ht 67.0 in | Wt 133.0 lb

## 2023-05-28 DIAGNOSIS — Z9889 Other specified postprocedural states: Secondary | ICD-10-CM

## 2023-05-28 DIAGNOSIS — I739 Peripheral vascular disease, unspecified: Secondary | ICD-10-CM

## 2023-05-28 LAB — VAS US ABI WITH/WO TBI
Left ABI: 1.11
Right ABI: 0.91

## 2023-05-28 NOTE — Telephone Encounter (Signed)
Kelsi from [facility] called wanting to know if the provider will follow on the orders

## 2023-05-28 NOTE — Telephone Encounter (Signed)
Verbal orders for PT, OT, and nursing were given.

## 2023-05-29 DIAGNOSIS — K5900 Constipation, unspecified: Secondary | ICD-10-CM | POA: Diagnosis not present

## 2023-05-29 DIAGNOSIS — E1142 Type 2 diabetes mellitus with diabetic polyneuropathy: Secondary | ICD-10-CM | POA: Diagnosis not present

## 2023-05-29 DIAGNOSIS — E7800 Pure hypercholesterolemia, unspecified: Secondary | ICD-10-CM | POA: Diagnosis not present

## 2023-05-29 DIAGNOSIS — I509 Heart failure, unspecified: Secondary | ICD-10-CM | POA: Diagnosis not present

## 2023-05-29 DIAGNOSIS — I2510 Atherosclerotic heart disease of native coronary artery without angina pectoris: Secondary | ICD-10-CM | POA: Diagnosis not present

## 2023-05-29 DIAGNOSIS — E559 Vitamin D deficiency, unspecified: Secondary | ICD-10-CM | POA: Diagnosis not present

## 2023-05-29 DIAGNOSIS — I110 Hypertensive heart disease with heart failure: Secondary | ICD-10-CM | POA: Diagnosis not present

## 2023-05-29 DIAGNOSIS — N390 Urinary tract infection, site not specified: Secondary | ICD-10-CM | POA: Diagnosis not present

## 2023-05-29 DIAGNOSIS — Z89432 Acquired absence of left foot: Secondary | ICD-10-CM | POA: Diagnosis not present

## 2023-05-29 NOTE — Telephone Encounter (Signed)
noted 

## 2023-05-30 DIAGNOSIS — E041 Nontoxic single thyroid nodule: Secondary | ICD-10-CM | POA: Diagnosis not present

## 2023-05-30 DIAGNOSIS — Z951 Presence of aortocoronary bypass graft: Secondary | ICD-10-CM | POA: Diagnosis not present

## 2023-05-30 DIAGNOSIS — E1140 Type 2 diabetes mellitus with diabetic neuropathy, unspecified: Secondary | ICD-10-CM | POA: Diagnosis not present

## 2023-05-30 DIAGNOSIS — K5900 Constipation, unspecified: Secondary | ICD-10-CM | POA: Diagnosis not present

## 2023-05-30 DIAGNOSIS — I509 Heart failure, unspecified: Secondary | ICD-10-CM | POA: Diagnosis not present

## 2023-05-30 DIAGNOSIS — Z95820 Peripheral vascular angioplasty status with implants and grafts: Secondary | ICD-10-CM | POA: Diagnosis not present

## 2023-05-30 DIAGNOSIS — E785 Hyperlipidemia, unspecified: Secondary | ICD-10-CM | POA: Diagnosis not present

## 2023-05-30 DIAGNOSIS — H539 Unspecified visual disturbance: Secondary | ICD-10-CM | POA: Diagnosis not present

## 2023-05-30 DIAGNOSIS — E7800 Pure hypercholesterolemia, unspecified: Secondary | ICD-10-CM | POA: Diagnosis not present

## 2023-05-30 DIAGNOSIS — E1151 Type 2 diabetes mellitus with diabetic peripheral angiopathy without gangrene: Secondary | ICD-10-CM | POA: Diagnosis not present

## 2023-05-30 DIAGNOSIS — Z9049 Acquired absence of other specified parts of digestive tract: Secondary | ICD-10-CM | POA: Diagnosis not present

## 2023-05-30 DIAGNOSIS — E1165 Type 2 diabetes mellitus with hyperglycemia: Secondary | ICD-10-CM | POA: Diagnosis not present

## 2023-05-30 DIAGNOSIS — N390 Urinary tract infection, site not specified: Secondary | ICD-10-CM | POA: Diagnosis not present

## 2023-05-30 DIAGNOSIS — I2510 Atherosclerotic heart disease of native coronary artery without angina pectoris: Secondary | ICD-10-CM | POA: Diagnosis not present

## 2023-05-30 DIAGNOSIS — Z89422 Acquired absence of other left toe(s): Secondary | ICD-10-CM | POA: Diagnosis not present

## 2023-05-30 DIAGNOSIS — Z794 Long term (current) use of insulin: Secondary | ICD-10-CM | POA: Diagnosis not present

## 2023-05-30 DIAGNOSIS — M1990 Unspecified osteoarthritis, unspecified site: Secondary | ICD-10-CM | POA: Diagnosis not present

## 2023-05-30 DIAGNOSIS — K219 Gastro-esophageal reflux disease without esophagitis: Secondary | ICD-10-CM | POA: Diagnosis not present

## 2023-05-30 DIAGNOSIS — I110 Hypertensive heart disease with heart failure: Secondary | ICD-10-CM | POA: Diagnosis not present

## 2023-05-30 DIAGNOSIS — E559 Vitamin D deficiency, unspecified: Secondary | ICD-10-CM | POA: Diagnosis not present

## 2023-05-30 DIAGNOSIS — Z4781 Encounter for orthopedic aftercare following surgical amputation: Secondary | ICD-10-CM | POA: Diagnosis not present

## 2023-05-30 DIAGNOSIS — Z7902 Long term (current) use of antithrombotics/antiplatelets: Secondary | ICD-10-CM | POA: Diagnosis not present

## 2023-05-30 DIAGNOSIS — Z961 Presence of intraocular lens: Secondary | ICD-10-CM | POA: Diagnosis not present

## 2023-05-30 DIAGNOSIS — Z7984 Long term (current) use of oral hypoglycemic drugs: Secondary | ICD-10-CM | POA: Diagnosis not present

## 2023-05-30 NOTE — Progress Notes (Signed)
Subjective:    Patient ID: Laura Reed, female    DOB: 04/16/1961, 62 y.o.   MRN: [id number] Chief Complaint  Patient presents with   Follow-up    2 week follow up- ABI ultrasound    Laura Reed is a 62 year old female who presents after extensive recent revascularization.  On 04/29/2023 she underwent a left femoropopliteal bypass graft.  She also underwent angiogram on 05/01/2023 following occlusion post bypass.  Since that time she has been doing fairly well.  She is not having a significant amount of swelling in the left but there is some.  She has incisions on her calf and groin area and those are healing well.  She also underwent amputation of her toes and that is also healing well per podiatry.  Today the patient has a right ABI 0.91 and a left of 1.11.  There are monophasic tibial waveforms but strong     Review of Systems  Cardiovascular:  Positive for leg swelling.  Skin:  Positive for wound.  All other systems reviewed and are negative.      Objective:   Physical Exam Vitals reviewed.  HENT:     Head: Normocephalic.  Cardiovascular:     Rate and Rhythm: Normal rate.     Pulses:          Dorsalis pedis pulses are detected w/ Doppler on the left side.       Posterior tibial pulses are detected w/ Doppler on the left side.  Pulmonary:     Effort: Pulmonary effort is normal.  Skin:    General: Skin is warm and dry.  Neurological:     Mental Status: She is alert and oriented to person, place, and time.  Psychiatric:        Mood and Affect: Mood normal.        Behavior: Behavior normal.        Thought Content: Thought content normal.        Judgment: Judgment normal.     BP (!) 109/58 (BP Location: Left Arm)   Pulse 64   Resp 18   Ht 5\' 7"  (1.702 m)   Wt 133 lb (60.3 kg)   BMI 20.83 kg/m   Past Medical History:  Diagnosis Date   Absence of kidney    left   Anxiety    Arthritis    Bladder cancer (HCC)    CHF (congestive heart failure) (HCC)     Complication of anesthesia    BP HAS  RUN LOW AFTER SURGERY-LUNGS FILLED UP WITH FLUID AFTER  LEG STENT SURGERY    Coronary artery disease    Diabetes mellitus    Family history of adverse reaction to anesthesia    Sister - PONV   GERD (gastroesophageal reflux disease)    OCC TUMS   Heart murmur    Hemorrhoid    History of methicillin resistant staphylococcus aureus (MRSA) 2007   Hypertension    Neuropathy    PVD (peripheral vascular disease) (HCC)    Thyroid nodule    right   Urothelial carcinoma of kidney (HCC) 10/31/2014   INVASIVE UROTHELIAL CARCINOMA, LOW GRADE. T1, Nx.   Wears dentures    full upper and lower    Social History   Socioeconomic History   Marital status: Legally Separated    Spouse name: Not on file   Number of children: 2   Years of education: 12   Highest education level: 12th grade  Occupational History  Occupation: Disabled  Tobacco Use   Smoking status: Former    Current packs/day: 0.00    Average packs/day: 2.0 packs/day for 35.0 years (70.0 ttl pk-yrs)    Types: Cigarettes    Start date: 03/29/1978    Quit date: 03/29/2013    Years since quitting: 10.1   Smokeless tobacco: Never  Vaping Use   Vaping status: Former  Substance and Sexual Activity   Alcohol use: Not Currently    Alcohol/week: 0.0 standard drinks of alcohol    Comment: LAST DRINK 2009   Drug use: Not Currently    Types: Cocaine    Comment: last used in 2007   Sexual activity: Not Currently  Other Topics Concern   Not on file  Social History Narrative   Not on file   Social Determinants of Health   Financial Resource Strain: Low Risk  (05/26/2023)   Received from [facility]   Overall Financial Resource Strain (CARDIA)    Difficulty of Paying Living Expenses: Not hard at all  Food Insecurity: Food Insecurity Present (05/26/2023)   Received from [facility]   Hunger Vital Sign    Worried About Running Out of Food in the Last Year:  Sometimes true    Ran Out of Food in the Last Year: Sometimes true  Transportation Needs: No Transportation Needs (05/26/2023)   Received from [facility] - Transportation    In the past 12 months, has lack of transportation kept you from medical appointments or from getting medications?: No    Lack of Transportation (Non-Medical): No  Physical Activity: Inactive (06/10/2021)   Exercise Vital Sign    Days of Exercise per Week: 0 days    Minutes of Exercise per Session: 0 min  Stress: No Stress Concern Present (06/12/2022)   [employer] of Occupational Health - Occupational Stress Questionnaire    Feeling of Stress : Only a little  Social Connections: Moderately Isolated (06/12/2022)   Social Connection and Isolation Panel [NHANES]    Frequency of Communication with Friends and Family: More than three times a week    Frequency of Social Gatherings with Friends and Family: More than three times a week    Attends Religious Services: 1 to 4 times per year    Active Member of [employer] or Organizations: No    Attends Banker Meetings: Never    Marital Status: Separated  Intimate Partner Violence: Not At Risk (04/24/2023)   Humiliation, Afraid, Rape, and Kick questionnaire    Fear of Current or Ex-Partner: No    Emotionally Abused: No    Physically Abused: No    Sexually Abused: No    Past Surgical History:  Procedure Laterality Date   AMPUTATION TOE     right (4th and 5th); left (great toe, 3rd)   AMPUTATION TOE Right 07/16/2018   Procedure: AMPUTATION TOE/MPJ right 2nd;  Surgeon: Linus Galas, DPM;  Location: [facility];  Service: Podiatry;  Laterality: Right;   ARTERIAL BYPASS SURGRY  2009, 2013 x 2   right leg , done in [place]   CARDIAC CATHETERIZATION     CAROTID ENDARTERECTOMY Right 01/2014   Dr Gilda Crease   CATARACT EXTRACTION W/PHACO Right 12/14/2014   Procedure: CATARACT EXTRACTION PHACO AND INTRAOCULAR LENS PLACEMENT (IOC);  Surgeon: Lia Hopping, MD;  Location: [facility];  Service: Ophthalmology;  Laterality: Right;  [place]   00:38.6  AP        7.1                   CDE  2.76   CATARACT EXTRACTION W/PHACO Left 12/06/2019   Procedure: CATARACT EXTRACTION PHACO AND INTRAOCULAR LENS PLACEMENT (IOC) LEFT DIABETIC;  Surgeon: Galen Manila, MD;  Location: [facility];  Service: Ophthalmology;  Laterality: Left;  9.08 1:06.4   CESAREAN SECTION     CHOLECYSTECTOMY  03-03-12   Porcelain gallbladder, gallstones,  [place]   COLONOSCOPY W/ BIOPSIES  04/28/2012   Hyperplastic rectal polyps.   COLONOSCOPY WITH PROPOFOL N/A 04/02/2022   Procedure: COLONOSCOPY WITH PROPOFOL;  Surgeon: Earline Mayotte, MD;  Location: [facility];  Service: Endoscopy;  Laterality: N/A;   CORONARY ARTERY BYPASS GRAFT  2009   3 vessel   CYSTOSCOPY W/ RETROGRADES Right 09/01/2016   Procedure: CYSTOSCOPY WITH RETROGRADE PYELOGRAM;  Surgeon: Vanna Scotland, MD;  Location: [facility];  Service: Urology;  Laterality: Right;   CYSTOSCOPY W/ RETROGRADES Bilateral 03/19/2020   Procedure: CYSTOSCOPY WITH RETROGRADE PYELOGRAM;  Surgeon: Vanna Scotland, MD;  Location: [facility];  Service: Urology;  Laterality: Bilateral;   CYSTOSCOPY WITH BIOPSY N/A 03/19/2020   Procedure: CYSTOSCOPY WITH BIOPSY;  Surgeon: Vanna Scotland, MD;  Location: [facility];  Service: Urology;  Laterality: N/A;   EYE SURGERY     FEMORAL-POPLITEAL BYPASS GRAFT Left 04/29/2023   Procedure: BYPASS GRAFT FEMORAL-POPLITEAL ARTERY;  Surgeon: Renford Dills, MD;  Location: [facility];  Service: Vascular;  Laterality: Left;   HERNIA REPAIR  10-31-14   ventral, retro-rectus atrium mesh   IRRIGATION AND DEBRIDEMENT FOOT Left 01/18/2019   Procedure: IRRIGATION AND DEBRIDEMENT FOOT;  Surgeon: Linus Galas, DPM;  Location: [facility];  Service: Podiatry;  Laterality: Left;   LOWER EXTREMITY ANGIOGRAPHY Left 12/10/2016   Procedure: Lower Extremity Angiography;  Surgeon: Renford Dills, MD;   Location: [facility];  Service: Cardiovascular;  Laterality: Left;   LOWER EXTREMITY ANGIOGRAPHY Left 02/02/2018   Procedure: LOWER EXTREMITY ANGIOGRAPHY;  Surgeon: Renford Dills, MD;  Location: [facility];  Service: Cardiovascular;  Laterality: Left;   LOWER EXTREMITY ANGIOGRAPHY Left 05/05/2018   Procedure: LOWER EXTREMITY ANGIOGRAPHY;  Surgeon: Renford Dills, MD;  Location: [facility];  Service: Cardiovascular;  Laterality: Left;   LOWER EXTREMITY ANGIOGRAPHY Left 12/04/2020   Procedure: LOWER EXTREMITY ANGIOGRAPHY with Intervention;  Surgeon: Renford Dills, MD;  Location: [facility];  Service: Cardiovascular;  Laterality: Left;   LOWER EXTREMITY ANGIOGRAPHY Left 04/24/2023   Procedure: Lower Extremity Angiography;  Surgeon: Renford Dills, MD;  Location: [facility];  Service: Cardiovascular;  Laterality: Left;   LOWER EXTREMITY ANGIOGRAPHY Left 05/01/2023   Procedure: Lower Extremity Angiography;  Surgeon: Renford Dills, MD;  Location: [facility];  Service: Cardiovascular;  Laterality: Left;   NEPHRECTOMY Left 10-31-14   PERIPHERAL VASCULAR CATHETERIZATION Left 05/01/2015   Procedure: Lower Extremity Angiography;  Surgeon: Renford Dills, MD;  Location: [facility];  Service: Cardiovascular;  Laterality: Left;   PERIPHERAL VASCULAR CATHETERIZATION  05/01/2015   Procedure: Lower Extremity Intervention;  Surgeon: Renford Dills, MD;  Location: [facility];  Service: Cardiovascular;;   PERIPHERAL VASCULAR CATHETERIZATION Left 02/20/2015   Procedure: Pelvic Angiography;  Surgeon: Renford Dills, MD;  Location: [facility];  Service: Cardiovascular;  Laterality: Left;   TRANSMETATARSAL AMPUTATION Left 05/05/2023   Procedure: TRANSMETATARSAL AMPUTATION LEFT;  Surgeon: Gwyneth Revels, DPM;  Location: [facility];  Service: Orthopedics/Podiatry;  Laterality: Left;   TRANSURETHRAL RESECTION  OF BLADDER TUMOR WITH MITOMYCIN-C N/A 09/01/2016   Procedure: TRANSURETHRAL RESECTION OF BLADDER TUMOR WITH MITOMYCIN-C;  Surgeon: Vanna Scotland, MD;  Location: [facility];  Service: Urology;  Laterality: N/A;    Family History  Problem Relation Age of Onset   Cancer Mother 69       Lung Cancer   Cancer Father 54       Lung Ca   Diabetes Son    Breast cancer Maternal Grandmother    Kidney cancer Neg Hx    Bladder Cancer Neg Hx    Prostate cancer Neg Hx     No Known Allergies     Latest Ref Rng & Units 05/10/2023    4:51 AM 05/03/2023   10:22 PM 05/03/2023    5:40 AM  CBC  WBC 4.0 - 10.5 K/uL 13.7  13.6  13.9   Hemoglobin 12.0 - 15.0 g/dL 9.6  91.4  78.2   Hematocrit 36.0 - 46.0 % 27.7  28.6  30.3   Platelets 150 - 400 K/uL 429  280  258       CMP     Component Value Date/Time   NA 139 05/04/2023 0558   NA 136 08/02/2021 1629   NA 135 11/02/2014 0609   K 4.3 05/04/2023 0558   K 4.2 11/02/2014 0609   CL 109 05/04/2023 0558   CL 107 11/02/2014 0609   CO2 21 (L) 05/04/2023 0558   CO2 23 11/02/2014 0609   GLUCOSE 306 (H) 05/04/2023 0558   GLUCOSE 108 (H) 11/02/2014 0609   BUN 26 (H) 05/04/2023 0558   BUN 25 08/02/2021 1629   BUN 20 11/02/2014 0609   CREATININE 0.67 05/04/2023 0558   CREATININE 1.01 11/09/2015 1549   CREATININE 1.01 11/09/2015 1549   CALCIUM 8.6 (L) 05/04/2023 0558   CALCIUM 7.3 (L) 11/02/2014 0609   PROT 5.5 (L) 05/04/2023 0558   PROT 6.6 08/02/2021 1629   PROT 5.1 (L) 06/03/2013 0353   ALBUMIN 2.5 (L) 05/04/2023 0558   ALBUMIN 4.3 08/02/2021 1629   ALBUMIN 2.2 (L) 06/03/2013 0353   AST 15 05/04/2023 0558   AST 7 (L) 06/03/2013 0353   ALT 13 05/04/2023 0558   ALT 10 (L) 06/03/2013 0353   ALKPHOS 55 05/04/2023 0558   ALKPHOS 74 06/03/2013 0353   BILITOT 0.9 05/04/2023 0558   BILITOT 0.3 08/02/2021 1629   BILITOT 0.2 06/03/2013 0353   GFR 61.81 01/27/2023 1134   EGFR 64 08/02/2021 1629   GFRNONAA >60 05/04/2023 0558   GFRNONAA 50 (L)  11/02/2014 0609     VAS [place] ABI WITH/WO TBI  Result Date: 05/28/2023  LOWER EXTREMITY DOPPLER STUDY Patient Name:  PRESLI HEPPLER  Date of Exam:   05/28/2023 Medical Rec #: [id number]        Accession #:    [id number] Date of Birth: Nov 23, 1960        Patient Gender: F Patient Age:   66 years Exam Location:  [facility] Procedure:      VAS [place] ABI WITH/WO TBI Referring Phys: GREGORY [name] --------------------------------------------------------------------------------  Indications: Peripheral artery disease. Other Factors: H/O right 4th & 5th and left 1st & 3rd toe amputations                12/03/2020 left pop thrombectomy and new stent.  Vascular Interventions: 05/01/2023 Left CF and AT stent  04/29/2023 Left fem-pop bypass graft                          H/O left-to-right fem-fem BPG with lower extremity                         stents & BPGs in [place];                         10/11/09: Left external iliac/common femoral artery PTA;                         07/25/11: Right profunda femoral to TP trunk BPG;                         12/31/11: Revision of fem-fem BPG & redo of anastomosis;                         09/16/12: Fem-fem BPG, left external iliac, left common                         femoral & left profunda femoral artery PTAs;                         06/01/13: Left external iliac to profunda femoral BPG                         with revision of the fem-fem & left fem-AK popliteal BPG                         proximal anastomoses;                         08/02/13: Left distal SFA/popliteal artery PTA;                         02/20/15: Left external iliac artery PTA/stent with left                         common iliac artery PTA;                         12/10/16: Left external iliac & common femoral artery                         PTAs. Performing Technologist: Hardie Lora RVT  Examination Guidelines: A complete evaluation includes at minimum, Doppler waveform signals and systolic  blood pressure reading at the level of bilateral brachial, anterior tibial, and posterior tibial arteries, when vessel segments are accessible. Bilateral testing is considered an integral part of a complete examination. Photoelectric Plethysmograph (PPG) waveforms and toe systolic pressure readings are included as required and additional duplex testing as needed. Limited examinations for reoccurring indications may be performed as noted.  ABI Findings: +---------+------------------+-----+----------+--------+ Right    Rt Pressure (mmHg)IndexWaveform  Comment  +---------+------------------+-----+----------+--------+ Brachial 104                                       +---------+------------------+-----+----------+--------+  PTA      112               0.91 biphasic           +---------+------------------+-----+----------+--------+ DP       112               0.91 monophasic         +---------+------------------+-----+----------+--------+ Great Toe83                0.67                    +---------+------------------+-----+----------+--------+ +--------+------------------+-----+----------+-------+ Left    Lt Pressure (mmHg)IndexWaveform  Comment +--------+------------------+-----+----------+-------+ [id number]                                      +--------+------------------+-----+----------+-------+ PTA     121               0.98 monophasic        +--------+------------------+-----+----------+-------+ DP      136               1.11 monophasic        +--------+------------------+-----+----------+-------+ +-------+-----------+-----------+------------+------------+ ABI/TBIToday's ABIToday's TBIPrevious ABIPrevious TBI +-------+-----------+-----------+------------+------------+ Right  0.91       0.67       0.84        0.50         +-------+-----------+-----------+------------+------------+ Left   1.11       Amputation 0.82        Amputation    +-------+-----------+-----------+------------+------------+ Right ABIs appear essentially unchanged compared to prior study on 12/25/2022. Left ABIs appear increased compared to prior study on 12/25/2022.  Summary: Right: Resting right ankle-brachial index indicates mild right lower extremity arterial disease. The right toe-brachial index is abnormal. Left: Resting left ankle-brachial index is within normal range. *See table(s) above for measurements and observations.  Electronically signed by [name] MD on 05/28/2023 at 5:40:53 PM.    Final        Assessment & Plan:   1. Peripheral arterial disease with history of revascularization (HCC) All staples and sutures were removed today.  The patient is healing fairly well and her studies reviewed that she currently has adequate perfusion in her left lower extremity.  Lower extremity amputation site is also healing well.  Will have her return in 2 weeks to evaluate wounds.   Current Outpatient Medications on File Prior to Visit  Medication Sig Dispense Refill   acetaminophen (TYLENOL) 500 MG tablet Take 1,000 mg by mouth every 6 (six) hours as needed for mild pain or headache.      ALPRAZolam (XANAX) 0.5 MG tablet Take 1 tablet (0.5 mg total) by mouth 2 (two) times daily as needed for anxiety. 30 tablet 1   amLODipine (NORVASC) 2.5 MG tablet TAKE 1 TABLET(2.5 MG) BY MOUTH DAILY 90 tablet 1   amoxicillin-clavulanate (AUGMENTIN) 875-125 MG tablet Take 1 tablet by mouth every 12 (twelve) hours. 14 tablet 0   apixaban (ELIQUIS) 2.5 MG TABS tablet Take 1 tablet (2.5 mg total) by mouth 2 (two) times daily. 60 tablet 11   aspirin 81 MG chewable tablet Chew 81 mg by mouth at bedtime.      atorvastatin (LIPITOR) 20 MG tablet TAKE 1 TABLET(20 MG) BY MOUTH DAILY 90 tablet 3   Cholecalciferol (VITAMIN D3) 2000 UNITS TABS Take 2,000 Units by mouth daily after supper.  clopidogrel (PLAVIX) 75 MG tablet Take 1 tablet (75 mg total) by mouth daily at 6  (six) AM. 30 tablet 11   Continuous Glucose Sensor (FREESTYLE LIBRE 2 SENSOR) MISC Use to check sugar at least 4 times daily 2 each 2   empagliflozin (JARDIANCE) 25 MG TABS tablet TAKE 1 TABLET(25 MG) BY MOUTH DAILY BEFORE AND BREAKFAST 90 tablet 2   gabapentin (NEURONTIN) 800 MG tablet TAKE 1 TABLET(800 MG) BY MOUTH THREE TIMES DAILY 270 tablet 3   glucose blood test strip Use you check blood sugars up to four times daily. 100 each 12   insulin degludec (TRESIBA FLEXTOUCH) 100 UNIT/ML FlexTouch Pen Inject 20 Units into the skin daily. 15 mL 2   insulin lispro (HUMALOG KWIKPEN) 100 UNIT/ML KwikPen ADMINISTER 10 UNITS UNDER THE SKIN THREE TIMES DAILY BEFORE MEALS (Patient taking differently: ADMINISTER 10 UNITS UNDER THE SKIN THREE TIMES DAILY BEFORE MEALS PRN) 15 mL 0   Insulin Pen Needle (PEN NEEDLES) 32G X 4 MM MISC Use to take insulin daily 100 each 3   losartan (COZAAR) 100 MG tablet Take 1 tablet (100 mg total) by mouth daily. (Patient taking differently: Take 50 mg by mouth daily.) 90 tablet 1   Multiple Vitamin (MULTIVITAMIN) tablet Take 1 tablet by mouth daily.     multivitamin-lutein (OCUVITE-LUTEIN) CAPS capsule Take 1 capsule by mouth daily.     triamcinolone ointment (KENALOG) 0.5 % Apply 1 Application topically 2 (two) times daily. 80 g 0   BIOTIN PO Take 1 tablet by mouth daily. (Patient not taking: Reported on 04/24/2023)     No current facility-administered medications on file prior to visit.    There are no Patient Instructions on file for this visit. No follow-ups on file.   Georgiana Spinner, NP

## 2023-06-01 ENCOUNTER — Telehealth: Payer: Self-pay

## 2023-06-01 NOTE — Telephone Encounter (Signed)
Judeth Cornfield, an occupational therapist with [facility], called to state patient has refused the OT evaluation, so she will not receive OT.

## 2023-06-01 NOTE — Telephone Encounter (Signed)
FYI

## 2023-06-02 DIAGNOSIS — T8189XA Other complications of procedures, not elsewhere classified, initial encounter: Secondary | ICD-10-CM | POA: Diagnosis not present

## 2023-06-02 DIAGNOSIS — S91301A Unspecified open wound, right foot, initial encounter: Secondary | ICD-10-CM | POA: Diagnosis not present

## 2023-06-02 DIAGNOSIS — E113512 Type 2 diabetes mellitus with proliferative diabetic retinopathy with macular edema, left eye: Secondary | ICD-10-CM | POA: Diagnosis not present

## 2023-06-05 DIAGNOSIS — I110 Hypertensive heart disease with heart failure: Secondary | ICD-10-CM | POA: Diagnosis not present

## 2023-06-05 DIAGNOSIS — I509 Heart failure, unspecified: Secondary | ICD-10-CM | POA: Diagnosis not present

## 2023-06-05 DIAGNOSIS — K5900 Constipation, unspecified: Secondary | ICD-10-CM | POA: Diagnosis not present

## 2023-06-05 DIAGNOSIS — E785 Hyperlipidemia, unspecified: Secondary | ICD-10-CM | POA: Diagnosis not present

## 2023-06-05 DIAGNOSIS — I2510 Atherosclerotic heart disease of native coronary artery without angina pectoris: Secondary | ICD-10-CM | POA: Diagnosis not present

## 2023-06-05 DIAGNOSIS — E559 Vitamin D deficiency, unspecified: Secondary | ICD-10-CM | POA: Diagnosis not present

## 2023-06-05 DIAGNOSIS — Z9049 Acquired absence of other specified parts of digestive tract: Secondary | ICD-10-CM | POA: Diagnosis not present

## 2023-06-05 DIAGNOSIS — Z7902 Long term (current) use of antithrombotics/antiplatelets: Secondary | ICD-10-CM | POA: Diagnosis not present

## 2023-06-05 DIAGNOSIS — E041 Nontoxic single thyroid nodule: Secondary | ICD-10-CM | POA: Diagnosis not present

## 2023-06-05 DIAGNOSIS — E1151 Type 2 diabetes mellitus with diabetic peripheral angiopathy without gangrene: Secondary | ICD-10-CM | POA: Diagnosis not present

## 2023-06-05 DIAGNOSIS — E1140 Type 2 diabetes mellitus with diabetic neuropathy, unspecified: Secondary | ICD-10-CM | POA: Diagnosis not present

## 2023-06-05 DIAGNOSIS — Z951 Presence of aortocoronary bypass graft: Secondary | ICD-10-CM | POA: Diagnosis not present

## 2023-06-05 DIAGNOSIS — Z4781 Encounter for orthopedic aftercare following surgical amputation: Secondary | ICD-10-CM | POA: Diagnosis not present

## 2023-06-05 DIAGNOSIS — Z95820 Peripheral vascular angioplasty status with implants and grafts: Secondary | ICD-10-CM | POA: Diagnosis not present

## 2023-06-05 DIAGNOSIS — E7800 Pure hypercholesterolemia, unspecified: Secondary | ICD-10-CM | POA: Diagnosis not present

## 2023-06-05 DIAGNOSIS — H539 Unspecified visual disturbance: Secondary | ICD-10-CM | POA: Diagnosis not present

## 2023-06-05 DIAGNOSIS — Z794 Long term (current) use of insulin: Secondary | ICD-10-CM | POA: Diagnosis not present

## 2023-06-05 DIAGNOSIS — Z7984 Long term (current) use of oral hypoglycemic drugs: Secondary | ICD-10-CM | POA: Diagnosis not present

## 2023-06-05 DIAGNOSIS — N390 Urinary tract infection, site not specified: Secondary | ICD-10-CM | POA: Diagnosis not present

## 2023-06-05 DIAGNOSIS — Z961 Presence of intraocular lens: Secondary | ICD-10-CM | POA: Diagnosis not present

## 2023-06-05 DIAGNOSIS — K219 Gastro-esophageal reflux disease without esophagitis: Secondary | ICD-10-CM | POA: Diagnosis not present

## 2023-06-05 DIAGNOSIS — Z89422 Acquired absence of other left toe(s): Secondary | ICD-10-CM | POA: Diagnosis not present

## 2023-06-05 DIAGNOSIS — M1990 Unspecified osteoarthritis, unspecified site: Secondary | ICD-10-CM | POA: Diagnosis not present

## 2023-06-05 DIAGNOSIS — E1165 Type 2 diabetes mellitus with hyperglycemia: Secondary | ICD-10-CM | POA: Diagnosis not present

## 2023-06-06 DIAGNOSIS — Z9049 Acquired absence of other specified parts of digestive tract: Secondary | ICD-10-CM | POA: Diagnosis not present

## 2023-06-06 DIAGNOSIS — Z7902 Long term (current) use of antithrombotics/antiplatelets: Secondary | ICD-10-CM | POA: Diagnosis not present

## 2023-06-06 DIAGNOSIS — M1990 Unspecified osteoarthritis, unspecified site: Secondary | ICD-10-CM | POA: Diagnosis not present

## 2023-06-06 DIAGNOSIS — Z4781 Encounter for orthopedic aftercare following surgical amputation: Secondary | ICD-10-CM | POA: Diagnosis not present

## 2023-06-06 DIAGNOSIS — I2510 Atherosclerotic heart disease of native coronary artery without angina pectoris: Secondary | ICD-10-CM | POA: Diagnosis not present

## 2023-06-06 DIAGNOSIS — Z951 Presence of aortocoronary bypass graft: Secondary | ICD-10-CM | POA: Diagnosis not present

## 2023-06-06 DIAGNOSIS — Z95820 Peripheral vascular angioplasty status with implants and grafts: Secondary | ICD-10-CM | POA: Diagnosis not present

## 2023-06-06 DIAGNOSIS — E7800 Pure hypercholesterolemia, unspecified: Secondary | ICD-10-CM | POA: Diagnosis not present

## 2023-06-06 DIAGNOSIS — E1165 Type 2 diabetes mellitus with hyperglycemia: Secondary | ICD-10-CM | POA: Diagnosis not present

## 2023-06-06 DIAGNOSIS — K219 Gastro-esophageal reflux disease without esophagitis: Secondary | ICD-10-CM | POA: Diagnosis not present

## 2023-06-06 DIAGNOSIS — Z961 Presence of intraocular lens: Secondary | ICD-10-CM | POA: Diagnosis not present

## 2023-06-06 DIAGNOSIS — Z7984 Long term (current) use of oral hypoglycemic drugs: Secondary | ICD-10-CM | POA: Diagnosis not present

## 2023-06-06 DIAGNOSIS — E559 Vitamin D deficiency, unspecified: Secondary | ICD-10-CM | POA: Diagnosis not present

## 2023-06-06 DIAGNOSIS — N390 Urinary tract infection, site not specified: Secondary | ICD-10-CM | POA: Diagnosis not present

## 2023-06-06 DIAGNOSIS — I509 Heart failure, unspecified: Secondary | ICD-10-CM | POA: Diagnosis not present

## 2023-06-06 DIAGNOSIS — E1151 Type 2 diabetes mellitus with diabetic peripheral angiopathy without gangrene: Secondary | ICD-10-CM | POA: Diagnosis not present

## 2023-06-06 DIAGNOSIS — I110 Hypertensive heart disease with heart failure: Secondary | ICD-10-CM | POA: Diagnosis not present

## 2023-06-06 DIAGNOSIS — E785 Hyperlipidemia, unspecified: Secondary | ICD-10-CM | POA: Diagnosis not present

## 2023-06-06 DIAGNOSIS — K5900 Constipation, unspecified: Secondary | ICD-10-CM | POA: Diagnosis not present

## 2023-06-06 DIAGNOSIS — E1140 Type 2 diabetes mellitus with diabetic neuropathy, unspecified: Secondary | ICD-10-CM | POA: Diagnosis not present

## 2023-06-06 DIAGNOSIS — H539 Unspecified visual disturbance: Secondary | ICD-10-CM | POA: Diagnosis not present

## 2023-06-06 DIAGNOSIS — Z89422 Acquired absence of other left toe(s): Secondary | ICD-10-CM | POA: Diagnosis not present

## 2023-06-06 DIAGNOSIS — E041 Nontoxic single thyroid nodule: Secondary | ICD-10-CM | POA: Diagnosis not present

## 2023-06-06 DIAGNOSIS — Z794 Long term (current) use of insulin: Secondary | ICD-10-CM | POA: Diagnosis not present

## 2023-06-08 ENCOUNTER — Encounter (INDEPENDENT_AMBULATORY_CARE_PROVIDER_SITE_OTHER): Payer: Self-pay | Admitting: Vascular Surgery

## 2023-06-08 ENCOUNTER — Ambulatory Visit (INDEPENDENT_AMBULATORY_CARE_PROVIDER_SITE_OTHER): Payer: 59 | Admitting: Vascular Surgery

## 2023-06-08 VITALS — BP 156/75 | HR 73 | Resp 18 | Ht 67.0 in | Wt 133.0 lb

## 2023-06-08 DIAGNOSIS — I70229 Atherosclerosis of native arteries of extremities with rest pain, unspecified extremity: Secondary | ICD-10-CM

## 2023-06-10 DIAGNOSIS — K219 Gastro-esophageal reflux disease without esophagitis: Secondary | ICD-10-CM | POA: Diagnosis not present

## 2023-06-10 DIAGNOSIS — K5900 Constipation, unspecified: Secondary | ICD-10-CM | POA: Diagnosis not present

## 2023-06-10 DIAGNOSIS — E559 Vitamin D deficiency, unspecified: Secondary | ICD-10-CM | POA: Diagnosis not present

## 2023-06-10 DIAGNOSIS — Z95820 Peripheral vascular angioplasty status with implants and grafts: Secondary | ICD-10-CM | POA: Diagnosis not present

## 2023-06-10 DIAGNOSIS — E785 Hyperlipidemia, unspecified: Secondary | ICD-10-CM | POA: Diagnosis not present

## 2023-06-10 DIAGNOSIS — E7800 Pure hypercholesterolemia, unspecified: Secondary | ICD-10-CM | POA: Diagnosis not present

## 2023-06-10 DIAGNOSIS — T8189XA Other complications of procedures, not elsewhere classified, initial encounter: Secondary | ICD-10-CM | POA: Diagnosis not present

## 2023-06-10 DIAGNOSIS — E041 Nontoxic single thyroid nodule: Secondary | ICD-10-CM | POA: Diagnosis not present

## 2023-06-10 DIAGNOSIS — Z951 Presence of aortocoronary bypass graft: Secondary | ICD-10-CM | POA: Diagnosis not present

## 2023-06-10 DIAGNOSIS — H539 Unspecified visual disturbance: Secondary | ICD-10-CM | POA: Diagnosis not present

## 2023-06-10 DIAGNOSIS — I509 Heart failure, unspecified: Secondary | ICD-10-CM | POA: Diagnosis not present

## 2023-06-10 DIAGNOSIS — Z794 Long term (current) use of insulin: Secondary | ICD-10-CM | POA: Diagnosis not present

## 2023-06-10 DIAGNOSIS — Z9049 Acquired absence of other specified parts of digestive tract: Secondary | ICD-10-CM | POA: Diagnosis not present

## 2023-06-10 DIAGNOSIS — E1151 Type 2 diabetes mellitus with diabetic peripheral angiopathy without gangrene: Secondary | ICD-10-CM | POA: Diagnosis not present

## 2023-06-10 DIAGNOSIS — Z89422 Acquired absence of other left toe(s): Secondary | ICD-10-CM | POA: Diagnosis not present

## 2023-06-10 DIAGNOSIS — E1140 Type 2 diabetes mellitus with diabetic neuropathy, unspecified: Secondary | ICD-10-CM | POA: Diagnosis not present

## 2023-06-10 DIAGNOSIS — I110 Hypertensive heart disease with heart failure: Secondary | ICD-10-CM | POA: Diagnosis not present

## 2023-06-10 DIAGNOSIS — E1165 Type 2 diabetes mellitus with hyperglycemia: Secondary | ICD-10-CM | POA: Diagnosis not present

## 2023-06-10 DIAGNOSIS — Z7984 Long term (current) use of oral hypoglycemic drugs: Secondary | ICD-10-CM | POA: Diagnosis not present

## 2023-06-10 DIAGNOSIS — Z961 Presence of intraocular lens: Secondary | ICD-10-CM | POA: Diagnosis not present

## 2023-06-10 DIAGNOSIS — Z7902 Long term (current) use of antithrombotics/antiplatelets: Secondary | ICD-10-CM | POA: Diagnosis not present

## 2023-06-10 DIAGNOSIS — N390 Urinary tract infection, site not specified: Secondary | ICD-10-CM | POA: Diagnosis not present

## 2023-06-10 DIAGNOSIS — Z4781 Encounter for orthopedic aftercare following surgical amputation: Secondary | ICD-10-CM | POA: Diagnosis not present

## 2023-06-10 DIAGNOSIS — M1990 Unspecified osteoarthritis, unspecified site: Secondary | ICD-10-CM | POA: Diagnosis not present

## 2023-06-10 DIAGNOSIS — I2510 Atherosclerotic heart disease of native coronary artery without angina pectoris: Secondary | ICD-10-CM | POA: Diagnosis not present

## 2023-06-14 ENCOUNTER — Encounter (INDEPENDENT_AMBULATORY_CARE_PROVIDER_SITE_OTHER): Payer: Self-pay | Admitting: Vascular Surgery

## 2023-06-14 NOTE — Progress Notes (Signed)
Patient ID: Laura Reed, female   DOB: 01-24-1961, 62 y.o.   MRN: [id number]  Chief Complaint  Patient presents with   Follow-up    fu 2 weeks no studies    HPI Laura Reed is a 62 y.o. female.    Patient states she is doing well and denies pain in the left lower extremity   Past Medical History:  Diagnosis Date   Absence of kidney    left   Anxiety    Arthritis    Bladder cancer (HCC)    CHF (congestive heart failure) (HCC)    Complication of anesthesia    BP HAS  RUN LOW AFTER SURGERY-LUNGS FILLED UP WITH FLUID AFTER  LEG STENT SURGERY    Coronary artery disease    Diabetes mellitus    Family history of adverse reaction to anesthesia    Sister - PONV   GERD (gastroesophageal reflux disease)    OCC TUMS   Heart murmur    Hemorrhoid    History of methicillin resistant staphylococcus aureus (MRSA) 2007   Hypertension    Neuropathy    PVD (peripheral vascular disease) (HCC)    Thyroid nodule    right   Urothelial carcinoma of kidney (HCC) 10/31/2014   INVASIVE UROTHELIAL CARCINOMA, LOW GRADE. T1, Nx.   Wears dentures    full upper and lower    Past Surgical History:  Procedure Laterality Date   AMPUTATION TOE     right (4th and 5th); left (great toe, 3rd)   AMPUTATION TOE Right 07/16/2018   Procedure: AMPUTATION TOE/MPJ right 2nd;  Surgeon: Linus Galas, DPM;  Location: [facility];  Service: Podiatry;  Laterality: Right;   ARTERIAL BYPASS SURGRY  2009, 2013 x 2   right leg , done in [place]   CARDIAC CATHETERIZATION     CAROTID ENDARTERECTOMY Right 01/2014   Dr Gilda Crease   CATARACT EXTRACTION W/PHACO Right 12/14/2014   Procedure: CATARACT EXTRACTION PHACO AND INTRAOCULAR LENS PLACEMENT (IOC);  Surgeon: Lia Hopping, MD;  Location: [facility];  Service: Ophthalmology;  Laterality: Right;  [place]   00:38.6              AP        7.1                   CDE  2.76   CATARACT EXTRACTION W/PHACO Left 12/06/2019   Procedure: CATARACT EXTRACTION PHACO AND INTRAOCULAR LENS  PLACEMENT (IOC) LEFT DIABETIC;  Surgeon: Galen Manila, MD;  Location: [facility];  Service: Ophthalmology;  Laterality: Left;  9.08 1:06.4   CESAREAN SECTION     CHOLECYSTECTOMY  03-03-12   Porcelain gallbladder, gallstones,  [place]   COLONOSCOPY W/ BIOPSIES  04/28/2012   Hyperplastic rectal polyps.   COLONOSCOPY WITH PROPOFOL N/A 04/02/2022   Procedure: COLONOSCOPY WITH PROPOFOL;  Surgeon: Earline Mayotte, MD;  Location: [facility];  Service: Endoscopy;  Laterality: N/A;   CORONARY ARTERY BYPASS GRAFT  2009   3 vessel   CYSTOSCOPY W/ RETROGRADES Right 09/01/2016   Procedure: CYSTOSCOPY WITH RETROGRADE PYELOGRAM;  Surgeon: Vanna Scotland, MD;  Location: [facility];  Service: Urology;  Laterality: Right;   CYSTOSCOPY W/ RETROGRADES Bilateral 03/19/2020   Procedure: CYSTOSCOPY WITH RETROGRADE PYELOGRAM;  Surgeon: Vanna Scotland, MD;  Location: [facility];  Service: Urology;  Laterality: Bilateral;   CYSTOSCOPY WITH BIOPSY N/A 03/19/2020   Procedure: CYSTOSCOPY WITH BIOPSY;  Surgeon: Vanna Scotland, MD;  Location: [facility];  Service: Urology;  Laterality: N/A;   EYE SURGERY     FEMORAL-POPLITEAL BYPASS GRAFT Left 04/29/2023   Procedure: BYPASS GRAFT FEMORAL-POPLITEAL ARTERY;  Surgeon: Renford Dills, MD;  Location: [facility];  Service: Vascular;  Laterality: Left;   HERNIA REPAIR  10-31-14   ventral, retro-rectus atrium mesh   IRRIGATION AND DEBRIDEMENT FOOT Left 01/18/2019   Procedure: IRRIGATION AND DEBRIDEMENT FOOT;  Surgeon: Linus Galas, DPM;  Location: [facility];  Service: Podiatry;  Laterality: Left;   LOWER EXTREMITY ANGIOGRAPHY Left 12/10/2016   Procedure: Lower Extremity Angiography;  Surgeon: Renford Dills, MD;  Location: [facility];  Service: Cardiovascular;  Laterality: Left;   LOWER EXTREMITY ANGIOGRAPHY Left 02/02/2018   Procedure: LOWER EXTREMITY ANGIOGRAPHY;  Surgeon: Renford Dills, MD;  Location: [facility];  Service: Cardiovascular;   Laterality: Left;   LOWER EXTREMITY ANGIOGRAPHY Left 05/05/2018   Procedure: LOWER EXTREMITY ANGIOGRAPHY;  Surgeon: Renford Dills, MD;  Location: [facility];  Service: Cardiovascular;  Laterality: Left;   LOWER EXTREMITY ANGIOGRAPHY Left 12/04/2020   Procedure: LOWER EXTREMITY ANGIOGRAPHY with Intervention;  Surgeon: Renford Dills, MD;  Location: [facility];  Service: Cardiovascular;  Laterality: Left;   LOWER EXTREMITY ANGIOGRAPHY Left 04/24/2023   Procedure: Lower Extremity Angiography;  Surgeon: Renford Dills, MD;  Location: [facility];  Service: Cardiovascular;  Laterality: Left;   LOWER EXTREMITY ANGIOGRAPHY Left 05/01/2023   Procedure: Lower Extremity Angiography;  Surgeon: Renford Dills, MD;  Location: [facility];  Service: Cardiovascular;  Laterality: Left;   NEPHRECTOMY Left 10-31-14   PERIPHERAL VASCULAR CATHETERIZATION Left 05/01/2015   Procedure: Lower Extremity Angiography;  Surgeon: Renford Dills, MD;  Location: [facility];  Service: Cardiovascular;  Laterality: Left;   PERIPHERAL VASCULAR CATHETERIZATION  05/01/2015   Procedure: Lower Extremity Intervention;  Surgeon: Renford Dills, MD;  Location: [facility];  Service: Cardiovascular;;   PERIPHERAL VASCULAR CATHETERIZATION Left 02/20/2015   Procedure: Pelvic Angiography;  Surgeon: Renford Dills, MD;  Location: [facility];  Service: Cardiovascular;  Laterality: Left;   TRANSMETATARSAL AMPUTATION Left 05/05/2023   Procedure: TRANSMETATARSAL AMPUTATION LEFT;  Surgeon: Gwyneth Revels, DPM;  Location: [facility];  Service: Orthopedics/Podiatry;  Laterality: Left;   TRANSURETHRAL RESECTION OF BLADDER TUMOR WITH MITOMYCIN-C N/A 09/01/2016   Procedure: TRANSURETHRAL RESECTION OF BLADDER TUMOR WITH MITOMYCIN-C;  Surgeon: Vanna Scotland, MD;  Location: [facility];  Service: Urology;  Laterality: N/A;      No Known Allergies  Current Outpatient  Medications  Medication Sig Dispense Refill   acetaminophen (TYLENOL) 500 MG tablet Take 1,000 mg by mouth every 6 (six) hours as needed for mild pain or headache.      ALPRAZolam (XANAX) 0.5 MG tablet Take 1 tablet (0.5 mg total) by mouth 2 (two) times daily as needed for anxiety. 30 tablet 1   amLODipine (NORVASC) 2.5 MG tablet TAKE 1 TABLET(2.5 MG) BY MOUTH DAILY 90 tablet 1   apixaban (ELIQUIS) 2.5 MG TABS tablet Take 1 tablet (2.5 mg total) by mouth 2 (two) times daily. 60 tablet 11   aspirin 81 MG chewable tablet Chew 81 mg by mouth at bedtime.      atorvastatin (LIPITOR) 20 MG tablet TAKE 1 TABLET(20 MG) BY MOUTH DAILY 90 tablet 3   BIOTIN PO Take 1 tablet by mouth daily.     Cholecalciferol (VITAMIN D3) 2000 UNITS TABS Take 2,000 Units by mouth daily after supper.  clopidogrel (PLAVIX) 75 MG tablet Take 1 tablet (75 mg total) by mouth daily at 6 (six) AM. 30 tablet 11   Continuous Glucose Sensor (FREESTYLE LIBRE 2 SENSOR) MISC Use to check sugar at least 4 times daily 2 each 2   empagliflozin (JARDIANCE) 25 MG TABS tablet TAKE 1 TABLET(25 MG) BY MOUTH DAILY BEFORE AND BREAKFAST 90 tablet 2   gabapentin (NEURONTIN) 800 MG tablet TAKE 1 TABLET(800 MG) BY MOUTH THREE TIMES DAILY 270 tablet 3   glucose blood test strip Use you check blood sugars up to four times daily. 100 each 12   insulin degludec (TRESIBA FLEXTOUCH) 100 UNIT/ML FlexTouch Pen Inject 20 Units into the skin daily. 15 mL 2   insulin lispro (HUMALOG KWIKPEN) 100 UNIT/ML KwikPen ADMINISTER 10 UNITS UNDER THE SKIN THREE TIMES DAILY BEFORE MEALS (Patient taking differently: ADMINISTER 10 UNITS UNDER THE SKIN THREE TIMES DAILY BEFORE MEALS PRN) 15 mL 0   Insulin Pen Needle (PEN NEEDLES) 32G X 4 MM MISC Use to take insulin daily 100 each 3   losartan (COZAAR) 100 MG tablet Take 1 tablet (100 mg total) by mouth daily. (Patient taking differently: Take 50 mg by mouth daily.) 90 tablet 1   Multiple Vitamin (MULTIVITAMIN) tablet Take  1 tablet by mouth daily.     multivitamin-lutein (OCUVITE-LUTEIN) CAPS capsule Take 1 capsule by mouth daily.     triamcinolone ointment (KENALOG) 0.5 % Apply 1 Application topically 2 (two) times daily. 80 g 0   amoxicillin-clavulanate (AUGMENTIN) 875-125 MG tablet Take 1 tablet by mouth every 12 (twelve) hours. (Patient not taking: Reported on 06/08/2023) 14 tablet 0   No current facility-administered medications for this visit.        Physical Exam BP (!) 156/75 (BP Location: Left Arm)   Pulse 73   Resp 18   Ht 5\' 7"  (1.702 m)   Wt 133 lb (60.3 kg)   BMI 20.83 kg/m  Gen:  WD/WN, NAD Skin: incision C/D/I 2+ palpable left DP pulse     Assessment/Plan: 1. Atherosclerosis of artery of extremity with rest pain [facility]) Recommend:  The patient is status post successful revascularization.  The patient reports that the rest pain symptoms and leg pain has improved.   The patient denies lifestyle limiting changes at this point in time.  No further invasive studies, angiography or surgery at this time. The patient should continue walking and begin a more formal exercise program.  The patient should continue antiplatelet therapy and aggressive treatment of the lipid abnormalities  Continued surveillance is indicated as atherosclerosis is likely to progress with time.    Patient should undergo noninvasive studies as ordered. The patient will follow up with me to review the studies.   - VAS [place] ABI WITH/WO TBI; Future - VAS [place] LOWER EXTREMITY ARTERIAL DUPLEX; Future     [name] 06/14/2023, 11:30 AM   This note was created with Dragon medical transcription system.  Any errors from dictation are unintentional.

## 2023-06-16 DIAGNOSIS — E559 Vitamin D deficiency, unspecified: Secondary | ICD-10-CM | POA: Diagnosis not present

## 2023-06-16 DIAGNOSIS — K219 Gastro-esophageal reflux disease without esophagitis: Secondary | ICD-10-CM | POA: Diagnosis not present

## 2023-06-16 DIAGNOSIS — Z961 Presence of intraocular lens: Secondary | ICD-10-CM | POA: Diagnosis not present

## 2023-06-16 DIAGNOSIS — E785 Hyperlipidemia, unspecified: Secondary | ICD-10-CM | POA: Diagnosis not present

## 2023-06-16 DIAGNOSIS — K5900 Constipation, unspecified: Secondary | ICD-10-CM | POA: Diagnosis not present

## 2023-06-16 DIAGNOSIS — M1990 Unspecified osteoarthritis, unspecified site: Secondary | ICD-10-CM | POA: Diagnosis not present

## 2023-06-16 DIAGNOSIS — Z95820 Peripheral vascular angioplasty status with implants and grafts: Secondary | ICD-10-CM | POA: Diagnosis not present

## 2023-06-16 DIAGNOSIS — I2510 Atherosclerotic heart disease of native coronary artery without angina pectoris: Secondary | ICD-10-CM | POA: Diagnosis not present

## 2023-06-16 DIAGNOSIS — E1165 Type 2 diabetes mellitus with hyperglycemia: Secondary | ICD-10-CM | POA: Diagnosis not present

## 2023-06-16 DIAGNOSIS — Z7984 Long term (current) use of oral hypoglycemic drugs: Secondary | ICD-10-CM | POA: Diagnosis not present

## 2023-06-16 DIAGNOSIS — E1151 Type 2 diabetes mellitus with diabetic peripheral angiopathy without gangrene: Secondary | ICD-10-CM | POA: Diagnosis not present

## 2023-06-16 DIAGNOSIS — Z4781 Encounter for orthopedic aftercare following surgical amputation: Secondary | ICD-10-CM | POA: Diagnosis not present

## 2023-06-16 DIAGNOSIS — N390 Urinary tract infection, site not specified: Secondary | ICD-10-CM | POA: Diagnosis not present

## 2023-06-16 DIAGNOSIS — Z9049 Acquired absence of other specified parts of digestive tract: Secondary | ICD-10-CM | POA: Diagnosis not present

## 2023-06-16 DIAGNOSIS — H539 Unspecified visual disturbance: Secondary | ICD-10-CM | POA: Diagnosis not present

## 2023-06-16 DIAGNOSIS — Z951 Presence of aortocoronary bypass graft: Secondary | ICD-10-CM | POA: Diagnosis not present

## 2023-06-16 DIAGNOSIS — Z794 Long term (current) use of insulin: Secondary | ICD-10-CM | POA: Diagnosis not present

## 2023-06-16 DIAGNOSIS — I509 Heart failure, unspecified: Secondary | ICD-10-CM | POA: Diagnosis not present

## 2023-06-16 DIAGNOSIS — Z7902 Long term (current) use of antithrombotics/antiplatelets: Secondary | ICD-10-CM | POA: Diagnosis not present

## 2023-06-16 DIAGNOSIS — Z89422 Acquired absence of other left toe(s): Secondary | ICD-10-CM | POA: Diagnosis not present

## 2023-06-16 DIAGNOSIS — E041 Nontoxic single thyroid nodule: Secondary | ICD-10-CM | POA: Diagnosis not present

## 2023-06-16 DIAGNOSIS — E7800 Pure hypercholesterolemia, unspecified: Secondary | ICD-10-CM | POA: Diagnosis not present

## 2023-06-16 DIAGNOSIS — I110 Hypertensive heart disease with heart failure: Secondary | ICD-10-CM | POA: Diagnosis not present

## 2023-06-16 DIAGNOSIS — E1140 Type 2 diabetes mellitus with diabetic neuropathy, unspecified: Secondary | ICD-10-CM | POA: Diagnosis not present

## 2023-06-17 ENCOUNTER — Ambulatory Visit: Payer: 59 | Admitting: *Deleted

## 2023-06-17 ENCOUNTER — Ambulatory Visit: Payer: 59 | Admitting: Internal Medicine

## 2023-06-17 VITALS — Ht 67.0 in | Wt 138.0 lb

## 2023-06-17 DIAGNOSIS — Z0000 Encounter for general adult medical examination without abnormal findings: Secondary | ICD-10-CM

## 2023-06-17 NOTE — Patient Instructions (Signed)
Laura Reed , Thank you for taking time to come for your Medicare Wellness Visit. I appreciate your ongoing commitment to your health goals. Please review the following plan we discussed and let me know if I can assist you in the future.   Referrals/Orders/Follow-Ups/Clinician Recommendations: Remember to update your Tetanus and Shingles vaccines  This is a list of the screening recommended for you and due dates:  Health Maintenance  Topic Date Due   Zoster (Shingles) Vaccine (1 of 2) Never done   Screening for Lung Cancer  Never done   DTaP/Tdap/Td vaccine (2 - Td or Tdap) 03/16/2022   Complete foot exam   05/06/2023   Eye exam for diabetics  07/07/2023*   COVID-19 Vaccine (7 - 2023-24 season) 07/20/2023   Hemoglobin A1C  07/30/2023   Yearly kidney health urinalysis for diabetes  09/08/2023   Yearly kidney function blood test for diabetes  05/03/2024   Medicare Annual Wellness Visit  06/16/2024   Mammogram  09/15/2024   Pap with HPV screening  01/27/2028   Colon Cancer Screening  04/02/2032   Flu Shot  Completed   Hepatitis C Screening  Completed   HIV Screening  Completed   HPV Vaccine  Aged Out  *Topic was postponed. The date shown is not the original due date.    Advanced directives: (In Chart) A copy of your advanced directives are scanned into your chart should your provider ever need it.  Next Medicare Annual Wellness Visit scheduled for next year: Yes 06/20/24 @ 9:30

## 2023-06-17 NOTE — Progress Notes (Signed)
Subjective:   Laura Reed is a 62 y.o. female who presents for Medicare Annual (Subsequent) preventive examination.  Visit Complete: Virtual I connected with  Newell Coral on 06/17/23 by a audio enabled telemedicine application and verified that I am speaking with the correct person using two identifiers.  Patient Location: Home  Provider Location: Home Office  I discussed the limitations of evaluation and management by telemedicine. The patient expressed understanding and agreed to proceed.  Vital Signs: Because this visit was a virtual/telehealth visit, some criteria may be missing or patient reported. Any vitals not documented were not able to be obtained and vitals that have been documented are patient reported.   Cardiac Risk Factors include: diabetes mellitus;dyslipidemia;hypertension;Other (see comment), Risk factor comments: CAD     Objective:    Today's Vitals   06/17/23 0926  Weight: 138 lb (62.6 kg)  Height: 5\' 7"  (1.702 m)   Body mass index is 21.61 kg/m.     06/17/2023    9:51 AM 05/05/2023   12:29 PM 04/29/2023   12:04 PM 04/24/2023    6:00 PM 04/24/2023   11:56 AM 06/12/2022    2:18 PM 04/02/2022    9:11 AM  Advanced Directives  Does Patient Have a Medical Advance Directive? Yes Yes Yes Yes Yes Yes Yes  Type of Estate agent of [place];Living will Healthcare Power of [place];Living will Healthcare Power of [place];Living will Healthcare Power of [employer] of [place];Living will Healthcare Power of [place];Living will Healthcare Power of [place];Living will  Does patient want to make changes to medical advance directive? No - Patient declined No - Patient declined No - Patient declined No - Patient declined  No - Patient declined   Copy of Healthcare Power of Attorney in Chart? Yes - validated most recent copy scanned in chart (See row information) No - copy requested No - copy requested No - copy requested No -  copy requested No - copy requested     Current Medications (verified) Outpatient Encounter Medications as of 06/17/2023  Medication Sig   acetaminophen (TYLENOL) 500 MG tablet Take 1,000 mg by mouth every 6 (six) hours as needed for mild pain or headache.    ALPRAZolam (XANAX) 0.5 MG tablet Take 1 tablet (0.5 mg total) by mouth 2 (two) times daily as needed for anxiety.   amLODipine (NORVASC) 2.5 MG tablet TAKE 1 TABLET(2.5 MG) BY MOUTH DAILY   apixaban (ELIQUIS) 2.5 MG TABS tablet Take 1 tablet (2.5 mg total) by mouth 2 (two) times daily.   aspirin 81 MG chewable tablet Chew 81 mg by mouth at bedtime.    atorvastatin (LIPITOR) 20 MG tablet TAKE 1 TABLET(20 MG) BY MOUTH DAILY   Cholecalciferol (VITAMIN D3) 2000 UNITS TABS Take 2,000 Units by mouth daily after supper.    clopidogrel (PLAVIX) 75 MG tablet Take 1 tablet (75 mg total) by mouth daily at 6 (six) AM.   Continuous Glucose Sensor (FREESTYLE LIBRE 2 SENSOR) MISC Use to check sugar at least 4 times daily   empagliflozin (JARDIANCE) 25 MG TABS tablet TAKE 1 TABLET(25 MG) BY MOUTH DAILY BEFORE AND BREAKFAST   gabapentin (NEURONTIN) 300 MG capsule Take 300 mg by mouth 2 (two) times daily.   glucose blood test strip Use you check blood sugars up to four times daily.   insulin degludec (TRESIBA FLEXTOUCH) 100 UNIT/ML FlexTouch Pen Inject 20 Units into the skin daily. (Patient taking differently: Inject 22 Units into the skin daily.)  insulin lispro (HUMALOG KWIKPEN) 100 UNIT/ML KwikPen ADMINISTER 10 UNITS UNDER THE SKIN THREE TIMES DAILY BEFORE MEALS (Patient taking differently: ADMINISTER 10 UNITS UNDER THE SKIN THREE TIMES DAILY BEFORE MEALS PRN)   Insulin Pen Needle (PEN NEEDLES) 32G X 4 MM MISC Use to take insulin daily   losartan (COZAAR) 100 MG tablet Take 1 tablet (100 mg total) by mouth daily. (Patient taking differently: Take 50 mg by mouth daily.)   Multiple Vitamin (MULTIVITAMIN) tablet Take 1 tablet by mouth daily.    multivitamin-lutein (OCUVITE-LUTEIN) CAPS capsule Take 1 capsule by mouth daily.   triamcinolone ointment (KENALOG) 0.5 % Apply 1 Application topically 2 (two) times daily.   [DISCONTINUED] amoxicillin-clavulanate (AUGMENTIN) 875-125 MG tablet Take 1 tablet by mouth every 12 (twelve) hours. (Patient not taking: Reported on 06/17/2023)   [DISCONTINUED] BIOTIN PO Take 1 tablet by mouth daily. (Patient not taking: Reported on 06/17/2023)   [DISCONTINUED] gabapentin (NEURONTIN) 800 MG tablet TAKE 1 TABLET(800 MG) BY MOUTH THREE TIMES DAILY (Patient not taking: Reported on 06/17/2023)   No facility-administered encounter medications on file as of 06/17/2023.    Allergies (verified) Patient has no known allergies.   History: Past Medical History:  Diagnosis Date   Absence of kidney    left   Anxiety    Arthritis    Bladder cancer (HCC)    CHF (congestive heart failure) (HCC)    Complication of anesthesia    BP HAS  RUN LOW AFTER SURGERY-LUNGS FILLED UP WITH FLUID AFTER  LEG STENT SURGERY    Coronary artery disease    Diabetes mellitus    Family history of adverse reaction to anesthesia    Sister - PONV   GERD (gastroesophageal reflux disease)    OCC TUMS   Heart murmur    Hemorrhoid    History of methicillin resistant staphylococcus aureus (MRSA) 2007   Hypertension    Neuropathy    PVD (peripheral vascular disease) (HCC)    Thyroid nodule    right   Urothelial carcinoma of kidney (HCC) 10/31/2014   INVASIVE UROTHELIAL CARCINOMA, LOW GRADE. T1, Nx.   Wears dentures    full upper and lower   Past Surgical History:  Procedure Laterality Date   AMPUTATION TOE     right (4th and 5th); left (great toe, 3rd)   AMPUTATION TOE Right 07/16/2018   Procedure: AMPUTATION TOE/MPJ right 2nd;  Surgeon: Linus Galas, DPM;  Location: [facility];  Service: Podiatry;  Laterality: Right;   ARTERIAL BYPASS SURGRY  2009, 2013 x 2   right leg , done in [place]   CARDIAC CATHETERIZATION     CAROTID  ENDARTERECTOMY Right 01/2014   Dr Gilda Crease   CATARACT EXTRACTION W/PHACO Right 12/14/2014   Procedure: CATARACT EXTRACTION PHACO AND INTRAOCULAR LENS PLACEMENT (IOC);  Surgeon: Lia Hopping, MD;  Location: [facility];  Service: Ophthalmology;  Laterality: Right;  [place]   00:38.6              AP        7.1                   CDE  2.76   CATARACT EXTRACTION W/PHACO Left 12/06/2019   Procedure: CATARACT EXTRACTION PHACO AND INTRAOCULAR LENS PLACEMENT (IOC) LEFT DIABETIC;  Surgeon: Galen Manila, MD;  Location: [facility];  Service: Ophthalmology;  Laterality: Left;  9.08 1:06.4   CESAREAN SECTION     CHOLECYSTECTOMY  03-03-12   Porcelain gallbladder, gallstones,  [place]  COLONOSCOPY W/ BIOPSIES  04/28/2012   Hyperplastic rectal polyps.   COLONOSCOPY WITH PROPOFOL N/A 04/02/2022   Procedure: COLONOSCOPY WITH PROPOFOL;  Surgeon: Earline Mayotte, MD;  Location: [facility];  Service: Endoscopy;  Laterality: N/A;   CORONARY ARTERY BYPASS GRAFT  2009   3 vessel   CYSTOSCOPY W/ RETROGRADES Right 09/01/2016   Procedure: CYSTOSCOPY WITH RETROGRADE PYELOGRAM;  Surgeon: Vanna Scotland, MD;  Location: [facility];  Service: Urology;  Laterality: Right;   CYSTOSCOPY W/ RETROGRADES Bilateral 03/19/2020   Procedure: CYSTOSCOPY WITH RETROGRADE PYELOGRAM;  Surgeon: Vanna Scotland, MD;  Location: [facility];  Service: Urology;  Laterality: Bilateral;   CYSTOSCOPY WITH BIOPSY N/A 03/19/2020   Procedure: CYSTOSCOPY WITH BIOPSY;  Surgeon: Vanna Scotland, MD;  Location: [facility];  Service: Urology;  Laterality: N/A;   EYE SURGERY     FEMORAL-POPLITEAL BYPASS GRAFT Left 04/29/2023   Procedure: BYPASS GRAFT FEMORAL-POPLITEAL ARTERY;  Surgeon: Renford Dills, MD;  Location: [facility];  Service: Vascular;  Laterality: Left;   HERNIA REPAIR  10-31-14   ventral, retro-rectus atrium mesh   IRRIGATION AND DEBRIDEMENT FOOT Left 01/18/2019   Procedure: IRRIGATION AND DEBRIDEMENT FOOT;  Surgeon: Linus Galas, DPM;   Location: [facility];  Service: Podiatry;  Laterality: Left;   LOWER EXTREMITY ANGIOGRAPHY Left 12/10/2016   Procedure: Lower Extremity Angiography;  Surgeon: Renford Dills, MD;  Location: [facility];  Service: Cardiovascular;  Laterality: Left;   LOWER EXTREMITY ANGIOGRAPHY Left 02/02/2018   Procedure: LOWER EXTREMITY ANGIOGRAPHY;  Surgeon: Renford Dills, MD;  Location: [facility];  Service: Cardiovascular;  Laterality: Left;   LOWER EXTREMITY ANGIOGRAPHY Left 05/05/2018   Procedure: LOWER EXTREMITY ANGIOGRAPHY;  Surgeon: Renford Dills, MD;  Location: [facility];  Service: Cardiovascular;  Laterality: Left;   LOWER EXTREMITY ANGIOGRAPHY Left 12/04/2020   Procedure: LOWER EXTREMITY ANGIOGRAPHY with Intervention;  Surgeon: Renford Dills, MD;  Location: [facility];  Service: Cardiovascular;  Laterality: Left;   LOWER EXTREMITY ANGIOGRAPHY Left 04/24/2023   Procedure: Lower Extremity Angiography;  Surgeon: Renford Dills, MD;  Location: [facility];  Service: Cardiovascular;  Laterality: Left;   LOWER EXTREMITY ANGIOGRAPHY Left 05/01/2023   Procedure: Lower Extremity Angiography;  Surgeon: Renford Dills, MD;  Location: [facility];  Service: Cardiovascular;  Laterality: Left;   NEPHRECTOMY Left 10-31-14   PERIPHERAL VASCULAR CATHETERIZATION Left 05/01/2015   Procedure: Lower Extremity Angiography;  Surgeon: Renford Dills, MD;  Location: [facility];  Service: Cardiovascular;  Laterality: Left;   PERIPHERAL VASCULAR CATHETERIZATION  05/01/2015   Procedure: Lower Extremity Intervention;  Surgeon: Renford Dills, MD;  Location: [facility];  Service: Cardiovascular;;   PERIPHERAL VASCULAR CATHETERIZATION Left 02/20/2015   Procedure: Pelvic Angiography;  Surgeon: Renford Dills, MD;  Location: [facility];  Service: Cardiovascular;  Laterality: Left;   TRANSMETATARSAL AMPUTATION Left  05/05/2023   Procedure: TRANSMETATARSAL AMPUTATION LEFT;  Surgeon: Gwyneth Revels, DPM;  Location: [facility];  Service: Orthopedics/Podiatry;  Laterality: Left;   TRANSURETHRAL RESECTION OF BLADDER TUMOR WITH MITOMYCIN-C N/A 09/01/2016   Procedure: TRANSURETHRAL RESECTION OF BLADDER TUMOR WITH MITOMYCIN-C;  Surgeon: Vanna Scotland, MD;  Location: [facility];  Service: Urology;  Laterality: N/A;   Family History  Problem Relation Age of Onset   Cancer Mother 62       Lung Cancer   Cancer Father 35       Lung Ca  Diabetes Son    Breast cancer Maternal Grandmother    Kidney cancer Neg Hx    Bladder Cancer Neg Hx    Prostate cancer Neg Hx    Social History   Socioeconomic History   Marital status: Legally Separated    Spouse name: Not on file   Number of children: 2   Years of education: 12   Highest education level: 12th grade  Occupational History   Occupation: Disabled  Tobacco Use   Smoking status: Former    Current packs/day: 0.00    Average packs/day: 2.0 packs/day for 35.0 years (70.0 ttl pk-yrs)    Types: Cigarettes    Start date: 03/29/1978    Quit date: 03/29/2013    Years since quitting: 10.2   Smokeless tobacco: Never  Vaping Use   Vaping status: Former  Substance and Sexual Activity   Alcohol use: Not Currently    Alcohol/week: 0.0 standard drinks of alcohol    Comment: LAST DRINK 2009   Drug use: Not Currently    Types: Cocaine    Comment: last used in 2007   Sexual activity: Not Currently  Other Topics Concern   Not on file  Social History Narrative   Not on file   Social Determinants of Health   Financial Resource Strain: Low Risk  (06/17/2023)   Overall Financial Resource Strain (CARDIA)    Difficulty of Paying Living Expenses: Not hard at all  Food Insecurity: No Food Insecurity (06/17/2023)   Hunger Vital Sign    Worried About Running Out of Food in the Last Year: Never true    Ran Out of Food in the Last Year: Never true  Recent Concern: Food  Insecurity - Food Insecurity Present (05/26/2023)   Received from [facility]   Hunger Vital Sign    Worried About Running Out of Food in the Last Year: Sometimes true    Ran Out of Food in the Last Year: Sometimes true  Transportation Needs: No Transportation Needs (06/17/2023)   PRAPARE - Administrator, Civil Service (Medical): No    Lack of Transportation (Non-Medical): No  Physical Activity: Inactive (06/17/2023)   Exercise Vital Sign    Days of Exercise per Week: 0 days    Minutes of Exercise per Session: 0 min  Stress: Stress Concern Present (06/17/2023)   [employer] of Occupational Health - Occupational Stress Questionnaire    Feeling of Stress : To some extent  Social Connections: Socially Isolated (06/17/2023)   Social Connection and Isolation Panel [NHANES]    Frequency of Communication with Friends and Family: More than three times a week    Frequency of Social Gatherings with Friends and Family: More than three times a week    Attends Religious Services: Never    Database administrator or Organizations: No    Attends Banker Meetings: Never    Marital Status: Widowed    Tobacco Counseling Counseling given: Not Answered   Clinical Intake:  Pre-visit preparation completed: Yes  Pain : No/denies pain     BMI - recorded: 21.61 Nutritional Status: BMI of 19-24  Normal Nutritional Risks: None Diabetes: Yes CBG done?: Yes (FBS 102) CBG resulted in Enter/ Edit results?: No Did pt. bring in CBG monitor from home?: No  How often do you need to have someone help you when you read instructions, pamphlets, or other written materials from your doctor or pharmacy?: 1 - Never  Interpreter Needed?: No  Information entered by :: R. Maryfrances Portugal LPN   Activities of Daily Living    06/17/2023    9:33 AM 04/24/2023    5:51 PM  In your present state of health, do you have any difficulty performing the following activities:   Hearing? 0   Vision? 0   Comment glasses   Difficulty concentrating or making decisions? 0   Walking or climbing stairs? 1   Comment recent surgery   Dressing or bathing? 0   Doing errands, shopping? 1 0  Comment yes because of recent surgery   Preparing Food and eating ? N   Using the Toilet? N   In the past six months, have you accidently leaked urine? Y   Do you have problems with loss of bowel control? N   Managing your Medications? N   Managing your Finances? N   Housekeeping or managing your Housekeeping? N     Patient Care Team: Sherlene Shams, MD as PCP - General (Internal Medicine) Lemar Livings Merrily Pew, MD (General Surgery) Sherlene Shams, MD (Internal Medicine) Gilda Crease, Latina Craver, MD (Vascular Surgery)  Indicate any recent Medical Services you may have received from other than [facility] providers in the past year (date may be approximate).     Assessment:   This is a routine wellness examination for [place].  Hearing/Vision screen Hearing Screening - Comments:: No issues Vision Screening - Comments:: glasses   Goals Addressed             This Visit's Progress    Patient Stated       Start an exercise program       Depression Screen    06/17/2023    9:44 AM 09/08/2022   10:05 AM 06/12/2022    2:18 PM 04/07/2022   10:50 AM 11/19/2021    3:02 PM 10/16/2021   10:06 AM 08/02/2021    3:38 PM  PHQ 2/9 Scores  PHQ - 2 Score 0 1 0 0 0 0   PHQ- 9 Score 1        Exception Documentation       Patient refusal    Fall Risk    06/17/2023    9:36 AM 03/04/2023   11:11 AM 01/27/2023   11:03 AM 09/08/2022   10:05 AM 06/12/2022    2:18 PM  Fall Risk   Falls in the past year? 1 0 0 1 0  Number falls in past yr: 0 0 0 0 0  Injury with Fall? 0 0 0 0   Risk for fall due to : History of fall(s);Impaired balance/gait No Fall Risks No Fall Risks History of fall(s) No Fall Risks  Follow up Falls evaluation completed;Falls prevention discussed Falls evaluation completed  Falls evaluation completed Falls evaluation completed Falls evaluation completed    MEDICARE RISK AT HOME: Medicare Risk at Home Any stairs in or around the home?: Yes If so, are there any without handrails?: No Home free of loose throw rugs in walkways, pet beds, electrical cords, etc?: Yes Adequate lighting in your home to reduce risk of falls?: Yes Life alert?: No Use of a cane, walker or w/c?: Yes (walker at times) Grab bars in the bathroom?: No Shower chair or bench in shower?: No Elevated toilet seat or a handicapped toilet?: Yes   Cognitive Function:    05/21/2018   11:55 AM 03/27/2017   10:53 AM  MMSE - Mini Mental State Exam  Orientation to time 5 5  Orientation to Place 5  5  Registration 3 3  Attention/ Calculation 5 5  Recall 3 3  Language- name 2 objects 2 2  Language- repeat 1 1  Language- follow 3 step command 3 3  Language- read & follow direction 1 1  Write a sentence 1 1  Copy design 1 1  Total score 30 30        06/17/2023    9:51 AM 06/12/2022    2:20 PM 06/07/2020   12:58 PM 05/26/2019    9:47 AM  6CIT Screen  What Year? 0 points 0 points 0 points 0 points  What month? 0 points 0 points 0 points 0 points  What time? 0 points 0 points  0 points  Count back from 20 0 points 0 points  0 points  Months in reverse 0 points 0 points 0 points 0 points  Repeat phrase 2 points 0 points    Total Score 2 points 0 points      Immunizations Immunization History  Administered Date(s) Administered   Influenza Split 03/16/2012   Influenza, Seasonal, Injecte, Preservative Fre 04/25/2023   Influenza,inj,Quad PF,6+ Mos 04/06/2013, 03/14/2014, 03/27/2017, 03/25/2018, 05/11/2019, 03/30/2020, 03/28/2021, 04/07/2022   Influenza-Unspecified 04/02/2015, 03/14/2016   Moderna Covid-19 Vaccine Bivalent Booster 70yrs & up 05/13/2021   Moderna Sars-Covid-2 Vaccination 09/28/2019, 10/26/2019, 05/29/2020, 10/29/2020   Pfizer(Comirnaty)Fall Seasonal Vaccine 12 years and  older 05/25/2023   Pneumococcal Conjugate-13 06/15/2014   Pneumococcal Polysaccharide-23 07/07/2010   Tdap 03/16/2012    TDAP status: Due, Education has been provided regarding the importance of this vaccine. Advised may receive this vaccine at local pharmacy or Health Dept. Aware to provide a copy of the vaccination record if obtained from local pharmacy or Health Dept. Verbalized acceptance and understanding.  Flu Vaccine status: Up to date  Pneumococcal vaccine status: Up to date  Covid-19 vaccine status: Information provided on how to obtain vaccines.   Qualifies for Shingles Vaccine? Yes   Zostavax completed No   Shingrix Completed?: No.    Education has been provided regarding the importance of this vaccine. Patient has been advised to call insurance company to determine out of pocket expense if they have not yet received this vaccine. Advised may also receive vaccine at local pharmacy or Health Dept. Verbalized acceptance and understanding.  Screening Tests Health Maintenance  Topic Date Due   Zoster Vaccines- Shingrix (1 of 2) Never done   Lung Cancer Screening  Never done   DTaP/Tdap/Td (2 - Td or Tdap) 03/16/2022   FOOT EXAM  05/06/2023   Medicare Annual Wellness (AWV)  06/13/2023   OPHTHALMOLOGY EXAM  07/07/2023 (Originally 02/15/2023)   COVID-19 Vaccine (7 - 2023-24 season) 07/20/2023   HEMOGLOBIN A1C  07/30/2023   Diabetic kidney evaluation - Urine ACR  09/08/2023   Diabetic kidney evaluation - eGFR measurement  05/03/2024   MAMMOGRAM  09/15/2024   Cervical Cancer Screening (HPV/Pap Cotest)  01/27/2028   Colonoscopy  04/02/2032   INFLUENZA VACCINE  Completed   Hepatitis C Screening  Completed   HIV Screening  Completed   HPV VACCINES  Aged Out    Health Maintenance  Health Maintenance Due  Topic Date Due   Zoster Vaccines- Shingrix (1 of 2) Never done   Lung Cancer Screening  Never done   DTaP/Tdap/Td (2 - Td or Tdap) 03/16/2022   FOOT EXAM  05/06/2023    Medicare Annual Wellness (AWV)  06/13/2023    Colorectal cancer screening: Type of screening: Colonoscopy. Completed 03/2022. Repeat every 10 years  Mammogram status: Completed 09/2022. Repeat every year   Lung Cancer Screening: (Low Dose CT Chest recommended if Age 32-80 years, 20 pack-year currently smoking OR have quit w/in 15years.) does qualify.  Patient will call and schedule appointment the first of the year    Additional Screening:  Hepatitis C Screening: does qualify; Completed 05/2015  Vision Screening: Recommended annual ophthalmology exams for early detection of glaucoma and other disorders of the eye. Is the patient up to date with their annual eye exam?  Yes  Who is the provider or what is the name of the office in which the patient attends annual eye exams? [facility]  If pt is not established with a provider, would they like to be referred to a provider to establish care? No .   Dental Screening: Recommended annual dental exams for proper oral hygiene  Diabetic Foot Exam: Diabetic Foot Exam: Overdue, Pt has been advised about the importance in completing this exam. Pt is scheduled for diabetic foot exam on Has appointment scheduled with PCP 07/15/23. Recently had foot surgery.  Community Resource Referral / Chronic Care Management: CRR required this visit?  No   CCM required this visit?  No     Plan:     I have personally reviewed and noted the following in the patient's chart:   Medical and social history Use of alcohol, tobacco or illicit drugs  Current medications and supplements including opioid prescriptions. Patient is not currently taking opioid prescriptions. Functional ability and status Nutritional status Physical activity Advanced directives List of other physicians Hospitalizations, surgeries, and ER visits in previous 12 months Vitals Screenings to include cognitive, depression, and falls Referrals and appointments  In addition, I have  reviewed and discussed with patient certain preventive protocols, quality metrics, and best practice recommendations. A written personalized care plan for preventive services as well as general preventive health recommendations were provided to patient.     Sydell Axon, LPN   16/04/[id number]   After Visit Summary: (MyChart) Due to this being a telephonic visit, the after visit summary with patients personalized plan was offered to patient via MyChart   Nurse Notes: None

## 2023-06-18 ENCOUNTER — Encounter: Payer: Self-pay | Admitting: Cardiology

## 2023-06-18 ENCOUNTER — Ambulatory Visit: Payer: 59 | Attending: Cardiology | Admitting: Cardiology

## 2023-06-18 ENCOUNTER — Ambulatory Visit: Payer: 59

## 2023-06-18 VITALS — BP 82/56 | HR 74 | Ht 67.0 in | Wt 141.4 lb

## 2023-06-18 DIAGNOSIS — Z951 Presence of aortocoronary bypass graft: Secondary | ICD-10-CM | POA: Diagnosis not present

## 2023-06-18 DIAGNOSIS — I739 Peripheral vascular disease, unspecified: Secondary | ICD-10-CM | POA: Diagnosis not present

## 2023-06-18 DIAGNOSIS — I499 Cardiac arrhythmia, unspecified: Secondary | ICD-10-CM

## 2023-06-18 DIAGNOSIS — R0609 Other forms of dyspnea: Secondary | ICD-10-CM | POA: Diagnosis not present

## 2023-06-18 DIAGNOSIS — I10 Essential (primary) hypertension: Secondary | ICD-10-CM | POA: Diagnosis not present

## 2023-06-18 NOTE — Patient Instructions (Signed)
Medication Instructions:   STOP Amlodipine  *If you need a refill on your cardiac medications before your next appointment, please call your pharmacy*   Lab Work:  None Ordered  If you have labs (blood work) drawn today and your tests are completely normal, you will receive your results only by: MyChart Message (if you have MyChart) OR A paper copy in the mail If you have any lab test that is abnormal or we need to change your treatment, we will call you to review the results.   Testing/Procedures:  Your physician has requested that you have an echocardiogram. Echocardiography is a painless test that uses sound waves to create images of your heart. It provides your doctor with information about the size and shape of your heart and how well your heart's chambers and valves are working. This procedure takes approximately one hour. There are no restrictions for this procedure. Please do NOT wear cologne, perfume, aftershave, or lotions (deodorant is allowed). Please arrive 15 minutes prior to your appointment time.  Please note: We ask at that you not bring children with you during ultrasound (echo/ vascular) testing. Due to room size and safety concerns, children are not allowed in the ultrasound rooms during exams. Our front office staff cannot provide observation of children in our lobby area while testing is being conducted. An adult accompanying a patient to their appointment will only be allowed in the ultrasound room at the discretion of the ultrasound technician under special circumstances. We apologize for any inconvenience.  [facility] MYOVIEW  Your Provider has ordered a Stress Test with nuclear imaging. The purpose of this test is to evaluate the blood supply to your heart muscle. This procedure is referred to as a "Non-Invasive Stress Test." This is because other than having an IV started in your vein, nothing is inserted or "invades" your body. Cardiac stress tests are done to find  areas of poor blood flow to the heart by determining the extent of coronary artery disease (CAD). Some patients exercise on a treadmill, which naturally increases the blood flow to your heart, while others who are unable to walk on a treadmill due to physical limitations have a pharmacologic/chemical stress agent called Lexiscan. This medicine will mimic walking on a treadmill by temporarily increasing your coronary blood flow.     REPORT TO [facility] MEDICAL MALL ENTRANCE  **Proceed to the 1st desk on the right, REGISTRATION, to check in**  Please note: this test may take anywhere between 2-4 hours to complete    Instructions regarding medication:   _XXX__:   You may take all of your regular morning medications the day of your test unless listed below.     How to prepare for your Myoview test:  Do not eat or drink for 6 hours prior to the test No caffeine for 24 hours prior to the test No smoking 24 hours prior to the test. Ladies, please do not wear dresses.  Skirts or pants are appropriate. Please wear a short sleeve shirt. No perfume, cologne or lotion. Wear comfortable walking shoes. No heels!   PLEASE NOTIFY THE OFFICE AT LEAST 24 HOURS IN ADVANCE IF YOU ARE UNABLE TO KEEP YOUR APPOINTMENT.  [phone] AND  PLEASE NOTIFY NUCLEAR MEDICINE AT [facility] AT LEAST 24 HOURS IN ADVANCE IF YOU ARE UNABLE TO KEEP YOUR APPOINTMENT. [phone]    Your physician has recommended that you wear a Zio monitor.   This monitor is a medical device that records the heart's electrical activity.  Doctors most often use these monitors to diagnose arrhythmias. Arrhythmias are problems with the speed or rhythm of the heartbeat. The monitor is a small device applied to your chest. You can wear one while you do your normal daily activities. While wearing this monitor if you have any symptoms to push the button and record what you felt. Once you have worn this monitor for the period of time provider prescribed  (Usually 14 days), you will return the monitor device in the postage paid box. Once it is returned they will download the data collected and provide [place] with a report which the provider will then review and we will call you with those results. Important tips:  Avoid showering during the first 24 hours of wearing the monitor. Avoid excessive sweating to help maximize wear time. Do not submerge the device, no hot tubs, and no swimming pools. Keep any lotions or oils away from the patch. After 24 hours you may shower with the patch on. Take brief showers with your back facing the shower head.  Do not remove patch once it has been placed because that will interrupt data and decrease adhesive wear time. Push the button when you have any symptoms and write down what you were feeling. Once you have completed wearing your monitor, remove and place into box which has postage paid and place in your outgoing mailbox.  If for some reason you have misplaced your box then call our office and we can provide another box and/or mail it off for you.   Follow-Up: At [facility], you and your health needs are our priority.  As part of our continuing mission to provide you with exceptional heart care, we have created designated Provider Care Teams.  These Care Teams include your primary Cardiologist (physician) and Advanced Practice Providers (APPs -  Physician Assistants and Nurse Practitioners) who all work together to provide you with the care you need, when you need it.  We recommend signing up for the patient portal called "MyChart".  Sign up information is provided on this After Visit Summary.  MyChart is used to connect with patients for Virtual Visits (Telemedicine).  Patients are able to view lab/test results, encounter notes, upcoming appointments, etc.  Non-urgent messages can be sent to your provider as well.   To learn more about what you can do with MyChart, go to [email or web address].     Your next appointment:   8 week(s)  Provider:   You may see Debbe Odea, MD or one of the following Advanced Practice Providers on your designated Care Team:   Nicolasa Ducking, NP Eula Listen, PA-C Cadence Fransico Michael, PA-C Charlsie Quest, NP Carlos Levering, NP

## 2023-06-18 NOTE — Progress Notes (Signed)
Cardiology Office Note:    Date:  06/18/2023   ID:  Laura Reed, DOB 11-27-1960, MRN [id number]  PCP:  Sherlene Shams, MD   [facility] Providers Cardiologist:  Debbe Odea, MD     Referring MD: Sherlene Shams, MD   Chief Complaint  Patient presents with   New Patient (Initial Visit)    Referred for cardiac evaluatin of Coronary artery disease of native artery of native heart with stable angina pectoris.  Transferring cardiac care from [facility] cardiology.      History of Present Illness:    Laura Reed is a 62 y.o. female with a hx of CAD/CABG x 3 in 2009, hypertension, former smoker x 35+ years, diabetes, PAD (s/p right iliac stent, left Fem-distal artery bypass plus subsequent thrombectomy angioplasty and stent placement 2024) who presents to establish care due to coronary artery disease.  Previously seen by [facility] cardiology from a cardiac perspective.  Last clinic visit 11 months ago.  She has extensive peripheral arterial disease surgery, had a left common femoral-distal bypass, thrombectomy, angioplasty and stent placement 04/2023.  Started on aspirin, Plavix and Eliquis after procedure per vascular surgery.  She notes occasional irregular heartbeats.  Endorses palpitations when she gets anxious or during a panic attack.  Endorses occasional shortness of breath but denies chest pain.  Stress test a year ago showed some anterior apical ischemia.  Losartan was reduced to 50 mg daily a month ago at rehab due to low BPs.  She also takes Norvasc 2.5 mg daily.  Outside echo 12/2021 EF 55%. Lexiscan Myoview 2023 moderate anteroapical ischemia.  Past Medical History:  Diagnosis Date   Absence of kidney    left   Anxiety    Arthritis    Atherosclerosis of native arteries of extremities with intermittent claudication, bilateral legs (HCC)    Bladder cancer (HCC)    CHF (congestive heart failure) (HCC)    Complication of anesthesia    BP HAS  RUN LOW AFTER  SURGERY-LUNGS FILLED UP WITH FLUID AFTER  LEG STENT SURGERY    Coronary artery disease    Diabetes mellitus    Family history of adverse reaction to anesthesia    Sister - PONV   GERD (gastroesophageal reflux disease)    OCC TUMS   Heart murmur    Hemorrhoid    History of methicillin resistant staphylococcus aureus (MRSA) 2007   Hypertension    Neuropathy    PVD (peripheral vascular disease) (HCC)    Thyroid nodule    right   Unspecified osteoarthritis, unspecified site    Urothelial carcinoma of kidney (HCC) 10/31/2014   INVASIVE UROTHELIAL CARCINOMA, LOW GRADE. T1, Nx.   Vitamin D deficiency, unspecified    Wears dentures    full upper and lower    Past Surgical History:  Procedure Laterality Date   AMPUTATION TOE     right (4th and 5th); left (great toe, 3rd)   AMPUTATION TOE Right 07/16/2018   Procedure: AMPUTATION TOE/MPJ right 2nd;  Surgeon: Linus Galas, DPM;  Location: [facility];  Service: Podiatry;  Laterality: Right;   ARTERIAL BYPASS SURGRY  2009, 2013 x 2   right leg , done in [place]   CARDIAC CATHETERIZATION     CAROTID ENDARTERECTOMY Right 01/2014   Dr Gilda Crease   CATARACT EXTRACTION W/PHACO Right 12/14/2014   Procedure: CATARACT EXTRACTION PHACO AND INTRAOCULAR LENS PLACEMENT (IOC);  Surgeon: Lia Hopping, MD;  Location: [facility];  Service: Ophthalmology;  Laterality: Right;  [place]   00:38.6              AP        7.1                   CDE  2.76   CATARACT EXTRACTION W/PHACO Left 12/06/2019   Procedure: CATARACT EXTRACTION PHACO AND INTRAOCULAR LENS PLACEMENT (IOC) LEFT DIABETIC;  Surgeon: Galen Manila, MD;  Location: [facility];  Service: Ophthalmology;  Laterality: Left;  9.08 1:06.4   CESAREAN SECTION     CHOLECYSTECTOMY  03-03-12   Porcelain gallbladder, gallstones,  [place]   COLONOSCOPY W/ BIOPSIES  04/28/2012   Hyperplastic rectal polyps.   COLONOSCOPY WITH PROPOFOL N/A 04/02/2022   Procedure: COLONOSCOPY WITH PROPOFOL;  Surgeon: Earline Mayotte, MD;  Location: [facility];  Service: Endoscopy;  Laterality: N/A;   CORONARY ARTERY BYPASS GRAFT  2009   3 vessel   CYSTOSCOPY W/ RETROGRADES Right 09/01/2016   Procedure: CYSTOSCOPY WITH RETROGRADE PYELOGRAM;  Surgeon: Vanna Scotland, MD;  Location: [facility];  Service: Urology;  Laterality: Right;   CYSTOSCOPY W/ RETROGRADES Bilateral 03/19/2020   Procedure: CYSTOSCOPY WITH RETROGRADE PYELOGRAM;  Surgeon: Vanna Scotland, MD;  Location: [facility];  Service: Urology;  Laterality: Bilateral;   CYSTOSCOPY WITH BIOPSY N/A 03/19/2020   Procedure: CYSTOSCOPY WITH BIOPSY;  Surgeon: Vanna Scotland, MD;  Location: [facility];  Service: Urology;  Laterality: N/A;   EYE SURGERY     FEMORAL-POPLITEAL BYPASS GRAFT Left 04/29/2023   Procedure: BYPASS GRAFT FEMORAL-POPLITEAL ARTERY;  Surgeon: Renford Dills, MD;  Location: [facility];  Service: Vascular;  Laterality: Left;   HERNIA REPAIR  10-31-14   ventral, retro-rectus atrium mesh   IRRIGATION AND DEBRIDEMENT FOOT Left 01/18/2019   Procedure: IRRIGATION AND DEBRIDEMENT FOOT;  Surgeon: Linus Galas, DPM;  Location: [facility];  Service: Podiatry;  Laterality: Left;   LOWER EXTREMITY ANGIOGRAPHY Left 12/10/2016   Procedure: Lower Extremity Angiography;  Surgeon: Renford Dills, MD;  Location: [facility];  Service: Cardiovascular;  Laterality: Left;   LOWER EXTREMITY ANGIOGRAPHY Left 02/02/2018   Procedure: LOWER EXTREMITY ANGIOGRAPHY;  Surgeon: Renford Dills, MD;  Location: [facility];  Service: Cardiovascular;  Laterality: Left;   LOWER EXTREMITY ANGIOGRAPHY Left 05/05/2018   Procedure: LOWER EXTREMITY ANGIOGRAPHY;  Surgeon: Renford Dills, MD;  Location: [facility];  Service: Cardiovascular;  Laterality: Left;   LOWER EXTREMITY ANGIOGRAPHY Left 12/04/2020   Procedure: LOWER EXTREMITY ANGIOGRAPHY with Intervention;  Surgeon: Renford Dills, MD;  Location: [facility];  Service: Cardiovascular;   Laterality: Left;   LOWER EXTREMITY ANGIOGRAPHY Left 04/24/2023   Procedure: Lower Extremity Angiography;  Surgeon: Renford Dills, MD;  Location: [facility];  Service: Cardiovascular;  Laterality: Left;   LOWER EXTREMITY ANGIOGRAPHY Left 05/01/2023   Procedure: Lower Extremity Angiography;  Surgeon: Renford Dills, MD;  Location: [facility];  Service: Cardiovascular;  Laterality: Left;   NEPHRECTOMY Left 10-31-14   PERIPHERAL VASCULAR CATHETERIZATION Left 05/01/2015   Procedure: Lower Extremity Angiography;  Surgeon: Renford Dills, MD;  Location: [facility];  Service: Cardiovascular;  Laterality: Left;   PERIPHERAL VASCULAR CATHETERIZATION  05/01/2015   Procedure: Lower Extremity Intervention;  Surgeon: Renford Dills, MD;  Location: [facility];  Service: Cardiovascular;;   PERIPHERAL VASCULAR CATHETERIZATION Left 02/20/2015   Procedure: Pelvic Angiography;  Surgeon: Renford Dills, MD;  Location: [facility];  Service: Cardiovascular;  Laterality: Left;   TRANSMETATARSAL AMPUTATION Left 05/05/2023   Procedure: TRANSMETATARSAL AMPUTATION LEFT;  Surgeon: Gwyneth Revels, DPM;  Location: [facility];  Service: Orthopedics/Podiatry;  Laterality: Left;   TRANSURETHRAL RESECTION OF BLADDER TUMOR WITH MITOMYCIN-C N/A 09/01/2016   Procedure: TRANSURETHRAL RESECTION OF BLADDER TUMOR WITH MITOMYCIN-C;  Surgeon: Vanna Scotland, MD;  Location: [facility];  Service: Urology;  Laterality: N/A;    Current Medications: Current Meds  Medication Sig   acetaminophen (TYLENOL) 500 MG tablet Take 1,000 mg by mouth every 6 (six) hours as needed for mild pain or headache.    ALPRAZolam (XANAX) 0.5 MG tablet Take 1 tablet (0.5 mg total) by mouth 2 (two) times daily as needed for anxiety.   apixaban (ELIQUIS) 2.5 MG TABS tablet Take 1 tablet (2.5 mg total) by mouth 2 (two) times daily.   aspirin 81 MG chewable tablet Chew 81 mg by mouth at bedtime.     atorvastatin (LIPITOR) 20 MG tablet TAKE 1 TABLET(20 MG) BY MOUTH DAILY   Cholecalciferol (VITAMIN D3) 2000 UNITS TABS Take 2,000 Units by mouth daily after supper.    clopidogrel (PLAVIX) 75 MG tablet Take 1 tablet (75 mg total) by mouth daily at 6 (six) AM.   Continuous Glucose Sensor (FREESTYLE LIBRE 2 SENSOR) MISC Use to check sugar at least 4 times daily   empagliflozin (JARDIANCE) 25 MG TABS tablet TAKE 1 TABLET(25 MG) BY MOUTH DAILY BEFORE AND BREAKFAST   gabapentin (NEURONTIN) 300 MG capsule Take 300 mg by mouth 2 (two) times daily.   glucose blood test strip Use you check blood sugars up to four times daily.   insulin degludec (TRESIBA FLEXTOUCH) 100 UNIT/ML FlexTouch Pen Inject 20 Units into the skin daily. (Patient taking differently: Inject 22 Units into the skin daily.)   insulin lispro (HUMALOG KWIKPEN) 100 UNIT/ML KwikPen ADMINISTER 10 UNITS UNDER THE SKIN THREE TIMES DAILY BEFORE MEALS (Patient taking differently: ADMINISTER 10 UNITS UNDER THE SKIN THREE TIMES DAILY BEFORE MEALS PRN)   Insulin Pen Needle (PEN NEEDLES) 32G X 4 MM MISC Use to take insulin daily   losartan (COZAAR) 50 MG tablet Take 50 mg by mouth daily.   Multiple Vitamin (MULTIVITAMIN) tablet Take 1 tablet by mouth daily.   multivitamin-lutein (OCUVITE-LUTEIN) CAPS capsule Take 1 capsule by mouth daily.   triamcinolone ointment (KENALOG) 0.5 % Apply 1 Application topically 2 (two) times daily.   [DISCONTINUED] amLODipine (NORVASC) 2.5 MG tablet TAKE 1 TABLET(2.5 MG) BY MOUTH DAILY     Allergies:   Patient has no known allergies.   Social History   Socioeconomic History   Marital status: Legally Separated    Spouse name: Not on file   Number of children: 2   Years of education: 12   Highest education level: 12th grade  Occupational History   Occupation: Disabled  Tobacco Use   Smoking status: Former    Current packs/day: 0.00    Average packs/day: 2.0 packs/day for 35.0 years (70.0 ttl pk-yrs)    Types:  Cigarettes    Start date: 03/29/1978    Quit date: 03/29/2013    Years since quitting: 10.2   Smokeless tobacco: Never  Vaping Use   Vaping status: Former  Substance and Sexual Activity   Alcohol use: Not Currently    Alcohol/week: 0.0 standard drinks of alcohol    Comment: LAST DRINK 2009   Drug use: Not Currently    Types: Cocaine    Comment: last used in 2007  Sexual activity: Not Currently  Other Topics Concern   Not on file  Social History Narrative   Not on file   Social Drivers of Health   Financial Resource Strain: Low Risk  (06/17/2023)   Overall Financial Resource Strain (CARDIA)    Difficulty of Paying Living Expenses: Not hard at all  Food Insecurity: No Food Insecurity (06/17/2023)   Hunger Vital Sign    Worried About Running Out of Food in the Last Year: Never true    Ran Out of Food in the Last Year: Never true  Recent Concern: Food Insecurity - Food Insecurity Present (05/26/2023)   Received from [facility]   Hunger Vital Sign    Worried About Running Out of Food in the Last Year: Sometimes true    Ran Out of Food in the Last Year: Sometimes true  Transportation Needs: No Transportation Needs (06/17/2023)   PRAPARE - Administrator, Civil Service (Medical): No    Lack of Transportation (Non-Medical): No  Physical Activity: Inactive (06/17/2023)   Exercise Vital Sign    Days of Exercise per Week: 0 days    Minutes of Exercise per Session: 0 min  Stress: Stress Concern Present (06/17/2023)   [employer] of Occupational Health - Occupational Stress Questionnaire    Feeling of Stress : To some extent  Social Connections: Socially Isolated (06/17/2023)   Social Connection and Isolation Panel [NHANES]    Frequency of Communication with Friends and Family: More than three times a week    Frequency of Social Gatherings with Friends and Family: More than three times a week    Attends Religious Services: Never    Loss adjuster, chartered or Organizations: No    Attends Banker Meetings: Never    Marital Status: Widowed     Family History: The patient's family history includes Breast cancer in her maternal grandmother; Cancer (age of onset: 45) in her mother; Cancer (age of onset: 73) in her father; Diabetes in her son. There is no history of Kidney cancer, Bladder Cancer, or Prostate cancer.  ROS:   Please see the history of present illness.     All other systems reviewed and are negative.  EKGs/Labs/Other Studies Reviewed:    The following studies were reviewed today:  EKG Interpretation Date/Time:  Thursday June 18 2023 08:46:37 EST Ventricular Rate:  74 PR Interval:  162 QRS Duration:  94 QT Interval:  408 QTC Calculation: 452 R Axis:   -37  Text Interpretation: Normal sinus rhythm Left axis deviation Cannot rule out Anterior infarct Confirmed by Debbe Odea ([id number]) on 06/18/2023 8:58:19 AM    Recent Labs: 09/08/2022: TSH 1.00 05/04/2023: ALT 13; BUN 26; Creatinine, Ser 0.67; Potassium 4.3; Sodium 139 05/10/2023: Hemoglobin 9.6; Platelets 429  Recent Lipid Panel    Component Value Date/Time   CHOL 85 01/27/2023 1134   CHOL 106 08/02/2021 1629   TRIG 65.0 01/27/2023 1134   HDL 41.80 01/27/2023 1134   HDL 49 08/02/2021 1629   CHOLHDL 2 01/27/2023 1134   VLDL 13.0 01/27/2023 1134   LDLCALC 30 01/27/2023 1134   LDLCALC 37 08/02/2021 1629   LDLDIRECT 36.0 01/27/2023 1134     Risk Assessment/Calculations:             Physical Exam:    VS:  BP (!) 82/56 (BP Location: Right Arm, Patient Position: Sitting, Cuff Size: Normal)   Pulse 74   Ht 5\' 7"  (1.702 m)  Wt 141 lb 6.4 oz (64.1 kg)   SpO2 98%   BMI 22.15 kg/m     Wt Readings from Last 3 Encounters:  06/18/23 141 lb 6.4 oz (64.1 kg)  06/17/23 138 lb (62.6 kg)  06/08/23 133 lb (60.3 kg)     GEN:  Well nourished, well developed in no acute distress HEENT: Normal NECK: No JVD; No carotid bruits CARDIAC:  RRR, no murmurs, rubs, gallops RESPIRATORY: Diminished breath sounds, no wheezing ABDOMEN: Soft, non-tender, non-distended MUSCULOSKELETAL:  No edema; No deformity  SKIN: Warm and dry NEUROLOGIC:  Alert and oriented x 3 PSYCHIATRIC:  Normal affect   ASSESSMENT:    1. Dyspnea on exertion   2. S/P CABG x 3   3. Primary hypertension   4. Irregular heart beat   5. PAD (peripheral artery disease) (HCC)    PLAN:    In order of problems listed above:  Dyspnea, history of abnormal stress test, CAD.  Obtain echo, get a scan Myoview to evaluate ischemia. CAD/CABG.  Denies chest pain.  Continue aspirin, Plavix, Lipitor 20 mg daily.  LDL at goal.  Echo and Myoview as above. Hypertension, BP low.  Stop Norvasc, continue losartan 50 mg daily.  If BP stays low, reduce losartan at follow-up visit. Irregular heartbeat, EKG 2 months ago showed occasional PVCs.  Place cardiac monitor to evaluate any A-fib or flutter.  Patient on Eliquis due to recent vascular surgery procedure/thrombectomy. PAD, managed per vascular surgery.  On aspirin, Plavix, Eliquis, Lipitor.  Follow-up after cardiac testing      Informed Consent   Shared Decision Making/Informed Consent The risks [chest pain, shortness of breath, cardiac arrhythmias, dizziness, blood pressure fluctuations, myocardial infarction, stroke/transient ischemic attack, nausea, vomiting, allergic reaction, radiation exposure, metallic taste sensation and life-threatening complications (estimated to be 1 in 10,000)], benefits (risk stratification, diagnosing coronary artery disease, treatment guidance) and alternatives of a nuclear stress test were discussed in detail with Ms. Pittillo and she agrees to proceed.       Medication Adjustments/Labs and Tests Ordered: Current medicines are reviewed at length with the patient today.  Concerns regarding medicines are outlined above.  Orders Placed This Encounter  Procedures   NM Myocar Multi W/Spect W/Wall  Motion / EF   LONG TERM MONITOR (3-14 DAYS)   EKG 12-Lead   ECHOCARDIOGRAM COMPLETE   No orders of the defined types were placed in this encounter.   Patient Instructions  Medication Instructions:   STOP Amlodipine  *If you need a refill on your cardiac medications before your next appointment, please call your pharmacy*   Lab Work:  None Ordered  If you have labs (blood work) drawn today and your tests are completely normal, you will receive your results only by: MyChart Message (if you have MyChart) OR A paper copy in the mail If you have any lab test that is abnormal or we need to change your treatment, we will call you to review the results.   Testing/Procedures:  Your physician has requested that you have an echocardiogram. Echocardiography is a painless test that uses sound waves to create images of your heart. It provides your doctor with information about the size and shape of your heart and how well your heart's chambers and valves are working. This procedure takes approximately one hour. There are no restrictions for this procedure. Please do NOT wear cologne, perfume, aftershave, or lotions (deodorant is allowed). Please arrive 15 minutes prior to your appointment time.  Please note: We ask  at that you not bring children with you during ultrasound (echo/ vascular) testing. Due to room size and safety concerns, children are not allowed in the ultrasound rooms during exams. Our front office staff cannot provide observation of children in our lobby area while testing is being conducted. An adult accompanying a patient to their appointment will only be allowed in the ultrasound room at the discretion of the ultrasound technician under special circumstances. We apologize for any inconvenience.  [facility] MYOVIEW  Your Provider has ordered a Stress Test with nuclear imaging. The purpose of this test is to evaluate the blood supply to your heart muscle. This procedure is referred  to as a "Non-Invasive Stress Test." This is because other than having an IV started in your vein, nothing is inserted or "invades" your body. Cardiac stress tests are done to find areas of poor blood flow to the heart by determining the extent of coronary artery disease (CAD). Some patients exercise on a treadmill, which naturally increases the blood flow to your heart, while others who are unable to walk on a treadmill due to physical limitations have a pharmacologic/chemical stress agent called Lexiscan. This medicine will mimic walking on a treadmill by temporarily increasing your coronary blood flow.     REPORT TO [facility] MEDICAL MALL ENTRANCE  **Proceed to the 1st desk on the right, REGISTRATION, to check in**  Please note: this test may take anywhere between 2-4 hours to complete    Instructions regarding medication:   _XXX__:   You may take all of your regular morning medications the day of your test unless listed below.     How to prepare for your Myoview test:  Do not eat or drink for 6 hours prior to the test No caffeine for 24 hours prior to the test No smoking 24 hours prior to the test. Ladies, please do not wear dresses.  Skirts or pants are appropriate. Please wear a short sleeve shirt. No perfume, cologne or lotion. Wear comfortable walking shoes. No heels!   PLEASE NOTIFY THE OFFICE AT LEAST 24 HOURS IN ADVANCE IF YOU ARE UNABLE TO KEEP YOUR APPOINTMENT.  [phone] AND  PLEASE NOTIFY NUCLEAR MEDICINE AT [facility] AT LEAST 24 HOURS IN ADVANCE IF YOU ARE UNABLE TO KEEP YOUR APPOINTMENT. [phone]    Your physician has recommended that you wear a Zio monitor.   This monitor is a medical device that records the heart's electrical activity. Doctors most often use these monitors to diagnose arrhythmias. Arrhythmias are problems with the speed or rhythm of the heartbeat. The monitor is a small device applied to your chest. You can wear one while you do your normal daily  activities. While wearing this monitor if you have any symptoms to push the button and record what you felt. Once you have worn this monitor for the period of time provider prescribed (Usually 14 days), you will return the monitor device in the postage paid box. Once it is returned they will download the data collected and provide [place] with a report which the provider will then review and we will call you with those results. Important tips:  Avoid showering during the first 24 hours of wearing the monitor. Avoid excessive sweating to help maximize wear time. Do not submerge the device, no hot tubs, and no swimming pools. Keep any lotions or oils away from the patch. After 24 hours you may shower with the patch on. Take brief showers with your back facing the shower head.  Do not remove patch once it has been placed because that will interrupt data and decrease adhesive wear time. Push the button when you have any symptoms and write down what you were feeling. Once you have completed wearing your monitor, remove and place into box which has postage paid and place in your outgoing mailbox.  If for some reason you have misplaced your box then call our office and we can provide another box and/or mail it off for you.   Follow-Up: At [facility], you and your health needs are our priority.  As part of our continuing mission to provide you with exceptional heart care, we have created designated Provider Care Teams.  These Care Teams include your primary Cardiologist (physician) and Advanced Practice Providers (APPs -  Physician Assistants and Nurse Practitioners) who all work together to provide you with the care you need, when you need it.  We recommend signing up for the patient portal called "MyChart".  Sign up information is provided on this After Visit Summary.  MyChart is used to connect with patients for Virtual Visits (Telemedicine).  Patients are able to view lab/test results, encounter  notes, upcoming appointments, etc.  Non-urgent messages can be sent to your provider as well.   To learn more about what you can do with MyChart, go to [email or web address].    Your next appointment:   8 week(s)  Provider:   You may see Debbe Odea, MD or one of the following Advanced Practice Providers on your designated Care Team:   Nicolasa Ducking, NP Eula Listen, PA-C Cadence Fransico Michael, PA-C Charlsie Quest, NP Carlos Levering, NP   Signed, Debbe Odea, MD  06/18/2023 9:57 AM    [facility]

## 2023-06-21 ENCOUNTER — Emergency Department: Payer: 59

## 2023-06-21 ENCOUNTER — Other Ambulatory Visit: Payer: Self-pay

## 2023-06-21 ENCOUNTER — Inpatient Hospital Stay
Admission: EM | Admit: 2023-06-21 | Discharge: 2023-07-24 | DRG: 264 | Disposition: A | Discharge status: Home Health | Payer: 59 | Attending: Hospitalist | Admitting: Hospitalist

## 2023-06-21 DIAGNOSIS — I2510 Atherosclerotic heart disease of native coronary artery without angina pectoris: Secondary | ICD-10-CM | POA: Diagnosis present

## 2023-06-21 DIAGNOSIS — E1151 Type 2 diabetes mellitus with diabetic peripheral angiopathy without gangrene: Secondary | ICD-10-CM | POA: Diagnosis not present

## 2023-06-21 DIAGNOSIS — N170 Acute kidney failure with tubular necrosis: Secondary | ICD-10-CM | POA: Diagnosis not present

## 2023-06-21 DIAGNOSIS — D72829 Elevated white blood cell count, unspecified: Secondary | ICD-10-CM | POA: Diagnosis not present

## 2023-06-21 DIAGNOSIS — D62 Acute posthemorrhagic anemia: Secondary | ICD-10-CM | POA: Diagnosis not present

## 2023-06-21 DIAGNOSIS — Z794 Long term (current) use of insulin: Secondary | ICD-10-CM

## 2023-06-21 DIAGNOSIS — Z79899 Other long term (current) drug therapy: Secondary | ICD-10-CM | POA: Diagnosis not present

## 2023-06-21 DIAGNOSIS — L02214 Cutaneous abscess of groin: Secondary | ICD-10-CM | POA: Diagnosis present

## 2023-06-21 DIAGNOSIS — Z8614 Personal history of Methicillin resistant Staphylococcus aureus infection: Secondary | ICD-10-CM

## 2023-06-21 DIAGNOSIS — E785 Hyperlipidemia, unspecified: Secondary | ICD-10-CM | POA: Diagnosis not present

## 2023-06-21 DIAGNOSIS — E1152 Type 2 diabetes mellitus with diabetic peripheral angiopathy with gangrene: Secondary | ICD-10-CM | POA: Diagnosis present

## 2023-06-21 DIAGNOSIS — B372 Candidiasis of skin and nail: Secondary | ICD-10-CM | POA: Diagnosis present

## 2023-06-21 DIAGNOSIS — E876 Hypokalemia: Secondary | ICD-10-CM | POA: Diagnosis not present

## 2023-06-21 DIAGNOSIS — Z833 Family history of diabetes mellitus: Secondary | ICD-10-CM

## 2023-06-21 DIAGNOSIS — Z7984 Long term (current) use of oral hypoglycemic drugs: Secondary | ICD-10-CM

## 2023-06-21 DIAGNOSIS — Y832 Surgical operation with anastomosis, bypass or graft as the cause of abnormal reaction of the patient, or of later complication, without mention of misadventure at the time of the procedure: Secondary | ICD-10-CM | POA: Diagnosis present

## 2023-06-21 DIAGNOSIS — K5900 Constipation, unspecified: Secondary | ICD-10-CM | POA: Diagnosis not present

## 2023-06-21 DIAGNOSIS — Z85528 Personal history of other malignant neoplasm of kidney: Secondary | ICD-10-CM

## 2023-06-21 DIAGNOSIS — Z7902 Long term (current) use of antithrombotics/antiplatelets: Secondary | ICD-10-CM

## 2023-06-21 DIAGNOSIS — Z87891 Personal history of nicotine dependence: Secondary | ICD-10-CM

## 2023-06-21 DIAGNOSIS — K219 Gastro-esophageal reflux disease without esophagitis: Secondary | ICD-10-CM | POA: Diagnosis present

## 2023-06-21 DIAGNOSIS — T8149XA Infection following a procedure, other surgical site, initial encounter: Secondary | ICD-10-CM | POA: Diagnosis not present

## 2023-06-21 DIAGNOSIS — E8809 Other disorders of plasma-protein metabolism, not elsewhere classified: Secondary | ICD-10-CM | POA: Diagnosis not present

## 2023-06-21 DIAGNOSIS — R935 Abnormal findings on diagnostic imaging of other abdominal regions, including retroperitoneum: Secondary | ICD-10-CM | POA: Diagnosis not present

## 2023-06-21 DIAGNOSIS — Z89421 Acquired absence of other right toe(s): Secondary | ICD-10-CM

## 2023-06-21 DIAGNOSIS — Z136 Encounter for screening for cardiovascular disorders: Secondary | ICD-10-CM | POA: Diagnosis not present

## 2023-06-21 DIAGNOSIS — E871 Hypo-osmolality and hyponatremia: Secondary | ICD-10-CM | POA: Diagnosis present

## 2023-06-21 DIAGNOSIS — E11649 Type 2 diabetes mellitus with hypoglycemia without coma: Secondary | ICD-10-CM | POA: Diagnosis not present

## 2023-06-21 DIAGNOSIS — Z905 Acquired absence of kidney: Secondary | ICD-10-CM | POA: Diagnosis not present

## 2023-06-21 DIAGNOSIS — R059 Cough, unspecified: Secondary | ICD-10-CM | POA: Diagnosis present

## 2023-06-21 DIAGNOSIS — A419 Sepsis, unspecified organism: Secondary | ICD-10-CM | POA: Diagnosis not present

## 2023-06-21 DIAGNOSIS — B9620 Unspecified Escherichia coli [E. coli] as the cause of diseases classified elsewhere: Secondary | ICD-10-CM | POA: Diagnosis present

## 2023-06-21 DIAGNOSIS — T501X5A Adverse effect of loop [high-ceiling] diuretics, initial encounter: Secondary | ICD-10-CM | POA: Diagnosis not present

## 2023-06-21 DIAGNOSIS — M7989 Other specified soft tissue disorders: Secondary | ICD-10-CM | POA: Diagnosis not present

## 2023-06-21 DIAGNOSIS — R001 Bradycardia, unspecified: Secondary | ICD-10-CM | POA: Diagnosis not present

## 2023-06-21 DIAGNOSIS — I97648 Postprocedural seroma of a circulatory system organ or structure following other circulatory system procedure: Secondary | ICD-10-CM | POA: Diagnosis not present

## 2023-06-21 DIAGNOSIS — I10 Essential (primary) hypertension: Secondary | ICD-10-CM | POA: Diagnosis present

## 2023-06-21 DIAGNOSIS — I493 Ventricular premature depolarization: Secondary | ICD-10-CM | POA: Diagnosis present

## 2023-06-21 DIAGNOSIS — R600 Localized edema: Secondary | ICD-10-CM | POA: Diagnosis not present

## 2023-06-21 DIAGNOSIS — Z8551 Personal history of malignant neoplasm of bladder: Secondary | ICD-10-CM | POA: Diagnosis not present

## 2023-06-21 DIAGNOSIS — Z7901 Long term (current) use of anticoagulants: Secondary | ICD-10-CM | POA: Diagnosis not present

## 2023-06-21 DIAGNOSIS — B379 Candidiasis, unspecified: Secondary | ICD-10-CM | POA: Diagnosis not present

## 2023-06-21 DIAGNOSIS — I110 Hypertensive heart disease with heart failure: Secondary | ICD-10-CM | POA: Diagnosis present

## 2023-06-21 DIAGNOSIS — L03314 Cellulitis of groin: Secondary | ICD-10-CM | POA: Diagnosis not present

## 2023-06-21 DIAGNOSIS — Z7982 Long term (current) use of aspirin: Secondary | ICD-10-CM

## 2023-06-21 DIAGNOSIS — R159 Full incontinence of feces: Secondary | ICD-10-CM | POA: Diagnosis not present

## 2023-06-21 DIAGNOSIS — I5032 Chronic diastolic (congestive) heart failure: Secondary | ICD-10-CM | POA: Diagnosis present

## 2023-06-21 DIAGNOSIS — L089 Local infection of the skin and subcutaneous tissue, unspecified: Secondary | ICD-10-CM | POA: Diagnosis not present

## 2023-06-21 DIAGNOSIS — M1990 Unspecified osteoarthritis, unspecified site: Secondary | ICD-10-CM | POA: Diagnosis present

## 2023-06-21 DIAGNOSIS — T827XXA Infection and inflammatory reaction due to other cardiac and vascular devices, implants and grafts, initial encounter: Principal | ICD-10-CM | POA: Diagnosis present

## 2023-06-21 DIAGNOSIS — Z95828 Presence of other vascular implants and grafts: Secondary | ICD-10-CM | POA: Diagnosis not present

## 2023-06-21 DIAGNOSIS — M79605 Pain in left leg: Secondary | ICD-10-CM | POA: Diagnosis present

## 2023-06-21 DIAGNOSIS — Z801 Family history of malignant neoplasm of trachea, bronchus and lung: Secondary | ICD-10-CM

## 2023-06-21 DIAGNOSIS — I739 Peripheral vascular disease, unspecified: Secondary | ICD-10-CM | POA: Diagnosis not present

## 2023-06-21 DIAGNOSIS — E1169 Type 2 diabetes mellitus with other specified complication: Secondary | ICD-10-CM | POA: Diagnosis present

## 2023-06-21 DIAGNOSIS — R6521 Severe sepsis with septic shock: Secondary | ICD-10-CM | POA: Diagnosis not present

## 2023-06-21 DIAGNOSIS — R531 Weakness: Secondary | ICD-10-CM | POA: Diagnosis not present

## 2023-06-21 DIAGNOSIS — Z9889 Other specified postprocedural states: Secondary | ICD-10-CM | POA: Diagnosis not present

## 2023-06-21 DIAGNOSIS — E559 Vitamin D deficiency, unspecified: Secondary | ICD-10-CM | POA: Diagnosis present

## 2023-06-21 DIAGNOSIS — I70203 Unspecified atherosclerosis of native arteries of extremities, bilateral legs: Secondary | ICD-10-CM | POA: Diagnosis not present

## 2023-06-21 DIAGNOSIS — R197 Diarrhea, unspecified: Secondary | ICD-10-CM | POA: Diagnosis present

## 2023-06-21 DIAGNOSIS — Z803 Family history of malignant neoplasm of breast: Secondary | ICD-10-CM

## 2023-06-21 DIAGNOSIS — D649 Anemia, unspecified: Secondary | ICD-10-CM | POA: Diagnosis present

## 2023-06-21 DIAGNOSIS — D3502 Benign neoplasm of left adrenal gland: Secondary | ICD-10-CM | POA: Diagnosis not present

## 2023-06-21 DIAGNOSIS — I4891 Unspecified atrial fibrillation: Secondary | ICD-10-CM | POA: Diagnosis present

## 2023-06-21 DIAGNOSIS — E1165 Type 2 diabetes mellitus with hyperglycemia: Secondary | ICD-10-CM | POA: Diagnosis present

## 2023-06-21 DIAGNOSIS — Z635 Disruption of family by separation and divorce: Secondary | ICD-10-CM

## 2023-06-21 DIAGNOSIS — I70213 Atherosclerosis of native arteries of extremities with intermittent claudication, bilateral legs: Secondary | ICD-10-CM | POA: Diagnosis not present

## 2023-06-21 DIAGNOSIS — Z7985 Long-term (current) use of injectable non-insulin antidiabetic drugs: Secondary | ICD-10-CM

## 2023-06-21 DIAGNOSIS — N179 Acute kidney failure, unspecified: Secondary | ICD-10-CM | POA: Diagnosis present

## 2023-06-21 DIAGNOSIS — E1140 Type 2 diabetes mellitus with diabetic neuropathy, unspecified: Secondary | ICD-10-CM | POA: Diagnosis present

## 2023-06-21 DIAGNOSIS — K5730 Diverticulosis of large intestine without perforation or abscess without bleeding: Secondary | ICD-10-CM | POA: Diagnosis not present

## 2023-06-21 DIAGNOSIS — S31109A Unspecified open wound of abdominal wall, unspecified quadrant without penetration into peritoneal cavity, initial encounter: Secondary | ICD-10-CM | POA: Diagnosis not present

## 2023-06-21 DIAGNOSIS — F419 Anxiety disorder, unspecified: Secondary | ICD-10-CM | POA: Diagnosis present

## 2023-06-21 DIAGNOSIS — R630 Anorexia: Secondary | ICD-10-CM | POA: Diagnosis not present

## 2023-06-21 DIAGNOSIS — Z951 Presence of aortocoronary bypass graft: Secondary | ICD-10-CM

## 2023-06-21 LAB — CBC
HCT: 30.7 % — ABNORMAL LOW (ref 36.0–46.0)
Hemoglobin: 10.6 g/dL — ABNORMAL LOW (ref 12.0–15.0)
MCH: 29.9 pg (ref 26.0–34.0)
MCHC: 34.5 g/dL (ref 30.0–36.0)
MCV: 86.7 fL (ref 80.0–100.0)
Platelets: 234 10*3/uL (ref 150–400)
RBC: 3.54 MIL/uL — ABNORMAL LOW (ref 3.87–5.11)
RDW: 12.9 % (ref 11.5–15.5)
WBC: 18.8 10*3/uL — ABNORMAL HIGH (ref 4.0–10.5)
nRBC: 0 % (ref 0.0–0.2)

## 2023-06-21 LAB — BASIC METABOLIC PANEL
Anion gap: 7 (ref 5–15)
Anion gap: 5 (ref 5–15)
BUN: 39 mg/dL — ABNORMAL HIGH (ref 8–23)
BUN: 39 mg/dL — ABNORMAL HIGH (ref 8–23)
CO2: 24 mmol/L (ref 22–32)
CO2: 25 mmol/L (ref 22–32)
Calcium: 8 mg/dL — ABNORMAL LOW (ref 8.9–10.3)
Calcium: 7.7 mg/dL — ABNORMAL LOW (ref 8.9–10.3)
Chloride: 93 mmol/L — ABNORMAL LOW (ref 98–111)
Chloride: 98 mmol/L (ref 98–111)
Creatinine, Ser: 1.29 mg/dL — ABNORMAL HIGH (ref 0.44–1.00)
Creatinine, Ser: 1.14 mg/dL — ABNORMAL HIGH (ref 0.44–1.00)
GFR, Estimated: 46 mL/min — ABNORMAL LOW (ref >60)
GFR, Estimated: 54 mL/min — ABNORMAL LOW (ref >60)
Glucose, Bld: 237 mg/dL — ABNORMAL HIGH (ref 70–99)
Glucose, Bld: 198 mg/dL — ABNORMAL HIGH (ref 70–99)
Potassium: 4.2 mmol/L (ref 3.5–5.1)
Potassium: 4.2 mmol/L (ref 3.5–5.1)
Sodium: 124 mmol/L — ABNORMAL LOW (ref 135–145)
Sodium: 128 mmol/L — ABNORMAL LOW (ref 135–145)

## 2023-06-21 LAB — PROTIME-INR
INR: 1.1 (ref 0.8–1.2)
Prothrombin Time: 14.8 s (ref 11.4–15.2)

## 2023-06-21 LAB — BRAIN NATRIURETIC PEPTIDE: B Natriuretic Peptide: 557.6 pg/mL — ABNORMAL HIGH (ref 0.0–100.0)

## 2023-06-21 LAB — LACTIC ACID, PLASMA: Lactic Acid, Venous: 1.1 mmol/L (ref 0.5–1.9)

## 2023-06-21 LAB — PROCALCITONIN: Procalcitonin: 12.39 ng/mL

## 2023-06-21 LAB — OSMOLALITY: Osmolality: 286 mosm/kg (ref 275–295)

## 2023-06-21 LAB — CBG MONITORING, ED: Glucose-Capillary: 201 mg/dL — ABNORMAL HIGH (ref 70–99)

## 2023-06-21 LAB — MAGNESIUM: Magnesium: 2.6 mg/dL — ABNORMAL HIGH (ref 1.7–2.4)

## 2023-06-21 LAB — PHOSPHORUS: Phosphorus: 2.3 mg/dL — ABNORMAL LOW (ref 2.5–4.6)

## 2023-06-21 LAB — APTT: aPTT: 35 s (ref 24–36)

## 2023-06-21 MED ORDER — OXYCODONE-ACETAMINOPHEN 5-325 MG PO TABS
1.0000 | ORAL_TABLET | ORAL | Status: DC | PRN
  Administered 2023-06-23 – 2023-06-30 (×14): 1 via ORAL
  Filled 2023-06-21 (×14): qty 1

## 2023-06-21 MED ORDER — HYDROMORPHONE HCL 1 MG/ML IJ SOLN
0.5000 mg | Freq: Once | INTRAMUSCULAR | Status: AC
  Administered 2023-06-21: 0.5 mg via INTRAVENOUS
  Filled 2023-06-21: qty 0.5

## 2023-06-21 MED ORDER — INSULIN ASPART 100 UNIT/ML IJ SOLN
0.0000 [IU] | Freq: Three times a day (TID) | INTRAMUSCULAR | Status: DC
  Administered 2023-06-22 – 2023-06-23 (×3): 3 [IU] via SUBCUTANEOUS
  Administered 2023-06-24 (×2): 2 [IU] via SUBCUTANEOUS
  Filled 2023-06-21 (×5): qty 1

## 2023-06-21 MED ORDER — ONDANSETRON HCL 4 MG/2ML IJ SOLN
4.0000 mg | Freq: Three times a day (TID) | INTRAMUSCULAR | Status: DC | PRN
  Administered 2023-06-23 – 2023-06-29 (×2): 4 mg via INTRAVENOUS
  Filled 2023-06-21: qty 2

## 2023-06-21 MED ORDER — ACETAMINOPHEN 325 MG PO TABS
650.0000 mg | ORAL_TABLET | Freq: Four times a day (QID) | ORAL | Status: DC | PRN
  Administered 2023-06-23: 650 mg via ORAL
  Filled 2023-06-21: qty 2

## 2023-06-21 MED ORDER — SODIUM CHLORIDE 1 G PO TABS
1.0000 g | ORAL_TABLET | Freq: Two times a day (BID) | ORAL | Status: DC
Start: 2023-06-22 — End: 2023-06-27
  Administered 2023-06-22 – 2023-06-26 (×9): 1 g via ORAL
  Filled 2023-06-21 (×11): qty 1

## 2023-06-21 MED ORDER — MORPHINE SULFATE (PF) 2 MG/ML IV SOLN: 2.0000 mg | INTRAVENOUS | Status: DC | PRN

## 2023-06-21 MED ORDER — POTASSIUM PHOSPHATES 15 MMOLE/5ML IV SOLN
15.0000 mmol | Freq: Once | INTRAVENOUS | Status: AC
  Administered 2023-06-21: 15 mmol via INTRAVENOUS
  Filled 2023-06-21: qty 5

## 2023-06-21 MED ORDER — INSULIN GLARGINE-YFGN 100 UNIT/ML ~~LOC~~ SOLN
14.0000 [IU] | Freq: Every day | SUBCUTANEOUS | Status: DC
  Administered 2023-06-21 – 2023-06-23 (×3): 14 [IU] via SUBCUTANEOUS
  Filled 2023-06-21 (×5): qty 0.14

## 2023-06-21 MED ORDER — INSULIN ASPART 100 UNIT/ML IJ SOLN
0.0000 [IU] | Freq: Every day | INTRAMUSCULAR | Status: DC
  Administered 2023-06-21 – 2023-06-23 (×2): 2 [IU] via SUBCUTANEOUS
  Filled 2023-06-21: qty 1

## 2023-06-21 MED ORDER — HYDRALAZINE HCL 20 MG/ML IJ SOLN: 5.0000 mg | INTRAMUSCULAR | Status: DC | PRN

## 2023-06-21 MED ORDER — SODIUM CHLORIDE 0.9 % IV BOLUS
500.0000 mL | Freq: Once | INTRAVENOUS | Status: AC
  Administered 2023-06-21: 500 mL via INTRAVENOUS

## 2023-06-21 NOTE — Consult Note (Signed)
[facility] Vascular Consult Note  MRN : [id number]  Laura Reed is a 62 y.o. (January 29, 1961) female who presents with chief complaint of  Chief Complaint  Patient presents with   Post-op Problem  .  History of Present Illness: She is 2 months s/p redo left groin procedure by Dr. Lorretta Harp.  She had rest pain and tissue loss in the foot and underwent TMA which has healed.  Her leg became swollen post-op immediately and has not improved but not worsened.  She has had diarrhea for 4 days and has left groin and leg pain that was new.  She never checked her temp but felt warm and times and does admit to shaking and feeling cold.  She came to the ER where a venous and femoral arterial Duplex was ordered and labs done and Vascular was called.  No current facility-administered medications for this encounter.   Current Outpatient Medications  Medication Sig Dispense Refill   acetaminophen (TYLENOL) 500 MG tablet Take 1,000 mg by mouth every 6 (six) hours as needed for mild pain or headache.      ALPRAZolam (XANAX) 0.5 MG tablet Take 1 tablet (0.5 mg total) by mouth 2 (two) times daily as needed for anxiety. 30 tablet 1   apixaban (ELIQUIS) 2.5 MG TABS tablet Take 1 tablet (2.5 mg total) by mouth 2 (two) times daily. 60 tablet 11   aspirin 81 MG chewable tablet Chew 81 mg by mouth at bedtime.      atorvastatin (LIPITOR) 20 MG tablet TAKE 1 TABLET(20 MG) BY MOUTH DAILY 90 tablet 3   Cholecalciferol (VITAMIN D3) 2000 UNITS TABS Take 2,000 Units by mouth daily after supper.      clopidogrel (PLAVIX) 75 MG tablet Take 1 tablet (75 mg total) by mouth daily at 6 (six) AM. 30 tablet 11   Continuous Glucose Sensor (FREESTYLE LIBRE 2 SENSOR) MISC Use to check sugar at least 4 times daily 2 each 2   empagliflozin (JARDIANCE) 25 MG TABS tablet TAKE 1 TABLET(25 MG) BY MOUTH DAILY BEFORE AND BREAKFAST 90 tablet 2   gabapentin (NEURONTIN) 300 MG capsule Take 300 mg by mouth 2 (two) times  daily.     glucose blood test strip Use you check blood sugars up to four times daily. 100 each 12   insulin degludec (TRESIBA FLEXTOUCH) 100 UNIT/ML FlexTouch Pen Inject 20 Units into the skin daily. (Patient taking differently: Inject 22 Units into the skin daily.) 15 mL 2   insulin lispro (HUMALOG KWIKPEN) 100 UNIT/ML KwikPen ADMINISTER 10 UNITS UNDER THE SKIN THREE TIMES DAILY BEFORE MEALS (Patient taking differently: ADMINISTER 10 UNITS UNDER THE SKIN THREE TIMES DAILY BEFORE MEALS PRN) 15 mL 0   Insulin Pen Needle (PEN NEEDLES) 32G X 4 MM MISC Use to take insulin daily 100 each 3   losartan (COZAAR) 50 MG tablet Take 50 mg by mouth daily.     Multiple Vitamin (MULTIVITAMIN) tablet Take 1 tablet by mouth daily.     multivitamin-lutein (OCUVITE-LUTEIN) CAPS capsule Take 1 capsule by mouth daily.     triamcinolone ointment (KENALOG) 0.5 % Apply 1 Application topically 2 (two) times daily. 80 g 0    Past Medical History:  Diagnosis Date   Absence of kidney    left   Anxiety    Arthritis    Atherosclerosis of native arteries of extremities with intermittent claudication, bilateral legs (HCC)    Bladder cancer (HCC)    CHF (congestive heart failure) (  HCC)    Complication of anesthesia    BP HAS  RUN LOW AFTER SURGERY-LUNGS FILLED UP WITH FLUID AFTER  LEG STENT SURGERY    Coronary artery disease    Diabetes mellitus    Family history of adverse reaction to anesthesia    Sister - PONV   GERD (gastroesophageal reflux disease)    OCC TUMS   Heart murmur    Hemorrhoid    History of methicillin resistant staphylococcus aureus (MRSA) 2007   Hypertension    Neuropathy    PVD (peripheral vascular disease) (HCC)    Thyroid nodule    right   Unspecified osteoarthritis, unspecified site    Urothelial carcinoma of kidney (HCC) 10/31/2014   INVASIVE UROTHELIAL CARCINOMA, LOW GRADE. T1, Nx.   Vitamin D deficiency, unspecified    Wears dentures    full upper and lower    Past Surgical  History:  Procedure Laterality Date   AMPUTATION TOE     right (4th and 5th); left (great toe, 3rd)   AMPUTATION TOE Right 07/16/2018   Procedure: AMPUTATION TOE/MPJ right 2nd;  Surgeon: Linus Galas, DPM;  Location: [facility];  Service: Podiatry;  Laterality: Right;   ARTERIAL BYPASS SURGRY  2009, 2013 x 2   right leg , done in [place]   CARDIAC CATHETERIZATION     CAROTID ENDARTERECTOMY Right 01/2014   Dr Gilda Crease   CATARACT EXTRACTION W/PHACO Right 12/14/2014   Procedure: CATARACT EXTRACTION PHACO AND INTRAOCULAR LENS PLACEMENT (IOC);  Surgeon: Lia Hopping, MD;  Location: [facility];  Service: Ophthalmology;  Laterality: Right;  [place]   00:38.6              AP        7.1                   CDE  2.76   CATARACT EXTRACTION W/PHACO Left 12/06/2019   Procedure: CATARACT EXTRACTION PHACO AND INTRAOCULAR LENS PLACEMENT (IOC) LEFT DIABETIC;  Surgeon: Galen Manila, MD;  Location: [facility];  Service: Ophthalmology;  Laterality: Left;  9.08 1:06.4   CESAREAN SECTION     CHOLECYSTECTOMY  03-03-12   Porcelain gallbladder, gallstones,  [place]   COLONOSCOPY W/ BIOPSIES  04/28/2012   Hyperplastic rectal polyps.   COLONOSCOPY WITH PROPOFOL N/A 04/02/2022   Procedure: COLONOSCOPY WITH PROPOFOL;  Surgeon: Earline Mayotte, MD;  Location: [facility];  Service: Endoscopy;  Laterality: N/A;   CORONARY ARTERY BYPASS GRAFT  2009   3 vessel   CYSTOSCOPY W/ RETROGRADES Right 09/01/2016   Procedure: CYSTOSCOPY WITH RETROGRADE PYELOGRAM;  Surgeon: Vanna Scotland, MD;  Location: [facility];  Service: Urology;  Laterality: Right;   CYSTOSCOPY W/ RETROGRADES Bilateral 03/19/2020   Procedure: CYSTOSCOPY WITH RETROGRADE PYELOGRAM;  Surgeon: Vanna Scotland, MD;  Location: [facility];  Service: Urology;  Laterality: Bilateral;   CYSTOSCOPY WITH BIOPSY N/A 03/19/2020   Procedure: CYSTOSCOPY WITH BIOPSY;  Surgeon: Vanna Scotland, MD;  Location: [facility];  Service: Urology;  Laterality: N/A;   EYE SURGERY      FEMORAL-POPLITEAL BYPASS GRAFT Left 04/29/2023   Procedure: BYPASS GRAFT FEMORAL-POPLITEAL ARTERY;  Surgeon: Renford Dills, MD;  Location: [facility];  Service: Vascular;  Laterality: Left;   HERNIA REPAIR  10-31-14   ventral, retro-rectus atrium mesh   IRRIGATION AND DEBRIDEMENT FOOT Left 01/18/2019   Procedure: IRRIGATION AND DEBRIDEMENT FOOT;  Surgeon: Linus Galas, DPM;  Location: [facility];  Service: Podiatry;  Laterality: Left;   LOWER EXTREMITY ANGIOGRAPHY Left  12/10/2016   Procedure: Lower Extremity Angiography;  Surgeon: Renford Dills, MD;  [facility];  Service: Cardiovascular;  Laterality: Left;   LOWER EXTREMITY ANGIOGRAPHY Left 02/02/2018   Procedure: LOWER EXTREMITY ANGIOGRAPHY;  Surgeon: Renford Dills, MD;  Location: [facility];  Service: Cardiovascular;  Laterality: Left;   LOWER EXTREMITY ANGIOGRAPHY Left 05/05/2018   Procedure: LOWER EXTREMITY ANGIOGRAPHY;  Surgeon: Renford Dills, MD;  Location: [facility];  Service: Cardiovascular;  Laterality: Left;   LOWER EXTREMITY ANGIOGRAPHY Left 12/04/2020   Procedure: LOWER EXTREMITY ANGIOGRAPHY with Intervention;  Surgeon: Renford Dills, MD;  Location: [facility];  Service: Cardiovascular;  Laterality: Left;   LOWER EXTREMITY ANGIOGRAPHY Left 04/24/2023   Procedure: Lower Extremity Angiography;  Surgeon: Renford Dills, MD;  Location: [facility];  Service: Cardiovascular;  Laterality: Left;   LOWER EXTREMITY ANGIOGRAPHY Left 05/01/2023   Procedure: Lower Extremity Angiography;  Surgeon: Renford Dills, MD;  Location: [facility];  Service: Cardiovascular;  Laterality: Left;   NEPHRECTOMY Left 10-31-14   PERIPHERAL VASCULAR CATHETERIZATION Left 05/01/2015   Procedure: Lower Extremity Angiography;  Surgeon: Renford Dills, MD;  Location: [facility];  Service: Cardiovascular;  Laterality: Left;   PERIPHERAL VASCULAR CATHETERIZATION   05/01/2015   Procedure: Lower Extremity Intervention;  Surgeon: Renford Dills, MD;  Location: [facility];  Service: Cardiovascular;;   PERIPHERAL VASCULAR CATHETERIZATION Left 02/20/2015   Procedure: Pelvic Angiography;  Surgeon: Renford Dills, MD;  Location: [facility];  Service: Cardiovascular;  Laterality: Left;   TRANSMETATARSAL AMPUTATION Left 05/05/2023   Procedure: TRANSMETATARSAL AMPUTATION LEFT;  Surgeon: Gwyneth Revels, DPM;  Location: [facility];  Service: Orthopedics/Podiatry;  Laterality: Left;   TRANSURETHRAL RESECTION OF BLADDER TUMOR WITH MITOMYCIN-C N/A 09/01/2016   Procedure: TRANSURETHRAL RESECTION OF BLADDER TUMOR WITH MITOMYCIN-C;  Surgeon: Vanna Scotland, MD;  Location: [facility];  Service: Urology;  Laterality: N/A;    Social History Social History   Tobacco Use   Smoking status: Former    Current packs/day: 0.00    Average packs/day: 2.0 packs/day for 35.0 years (70.0 ttl pk-yrs)    Types: Cigarettes    Start date: 03/29/1978    Quit date: 03/29/2013    Years since quitting: 10.2   Smokeless tobacco: Never  Vaping Use   Vaping status: Former  Substance Use Topics   Alcohol use: Not Currently    Alcohol/week: 0.0 standard drinks of alcohol    Comment: LAST DRINK 2009   Drug use: Not Currently    Types: Cocaine    Comment: last used in 2007    Family History Family History  Problem Relation Age of Onset   Cancer Mother 43       Lung Cancer   Cancer Father 70       Lung Ca   Diabetes Son    Breast cancer Maternal Grandmother    Kidney cancer Neg Hx    Bladder Cancer Neg Hx    Prostate cancer Neg Hx     No Known Allergies   REVIEW OF SYSTEMS (Negative unless checked)  Constitutional: [] Weight loss  [] Fever  [] Chills Cardiac: [] Chest pain   [] Chest pressure   [] Palpitations   [] Shortness of breath when laying flat   [] Shortness of breath at rest   [] Shortness of breath with exertion. Vascular:  [] Pain in legs with walking    [] Pain in legs at rest   []   Pain in legs when laying flat   [] Claudication   [] Pain in feet when walking  [] Pain in feet at rest  [] Pain in feet when laying flat   [] History of DVT   [] Phlebitis   [] Swelling in legs   [] Varicose veins   [] Non-healing ulcers Pulmonary:   [] Uses home oxygen   [] Productive cough   [] Hemoptysis   [] Wheeze  [] COPD   [] Asthma Neurologic:  [] Dizziness  [] Blackouts   [] Seizures   [] History of stroke   [] History of TIA  [] Aphasia   [] Temporary blindness   [] Dysphagia   [] Weakness or numbness in arms   [] Weakness or numbness in legs Musculoskeletal:  [] Arthritis   [] Joint swelling   [] Joint pain   [] Low back pain Hematologic:  [] Easy bruising  [] Easy bleeding   [] Hypercoagulable state   [] Anemic  [] Hepatitis Gastrointestinal:  [] Blood in stool   [] Vomiting blood  [] Gastroesophageal reflux/heartburn   [] Difficulty swallowing. Genitourinary:  [] Chronic kidney disease   [] Difficult urination  [] Frequent urination  [] Burning with urination   [] Blood in urine Skin:  [] Rashes   [] Ulcers   [] Wounds Psychological:  [] History of anxiety   []  History of major depression.  Physical Examination  Vitals:   06/21/23 1348 06/21/23 1349  BP:  136/86  Pulse:  72  Resp:  19  Temp:  98 F (36.7 C)  SpO2:  96%  Weight: 64.1 kg   Height: 5\' 7"  (1.702 m)    Body mass index is 22.15 kg/m. Gen:  WD/WN, NAD Head: [place]/AT, No temporalis wasting. Prominent temp pulse not noted. Ear/Nose/Throat: Hearing grossly intact, nares w/o erythema or drainage, oropharynx w/o Erythema/Exudate Eyes: Sclera non-icteric, conjunctiva clear Neck: Trachea midline.  No JVD.  Pulmonary:  Good air movement, respirations not labored, equal bilaterally.  Cardiac: RRR, normal S1, S2. Vascular:   Left groi9n is soft but full with palpable seroma and no drainage with healed skin incision.  Diffuse L leg edema.  Healed L TMA. Vessel Right Left  Radial Palpable Palpable  Ulnar Palpable Palpable  Brachial Palpable  Palpable  Carotid Palpable, without bruit Palpable, without bruit  Aorta Not palpable N/A  Femoral Palpable Palpable  Popliteal Palpable Palpable  PT Non-Palpable Non-Palpable excellent Doppler  DP Non-Palpable Non-Palpable Excellent Doppler   Gastrointestinal: soft, non-tender/non-distended. No guarding/reflex.  Musculoskeletal: M/S 5/5 throughout.  Extremities without ischemic changes.  No deformity or atrophy. No edema. Neurologic: Sensation grossly intact in extremities.  Symmetrical.  Speech is fluent. Motor exam as listed above. Psychiatric: Judgment intact, Mood & affect appropriate for pt's clinical situation. Dermatologic: No rashes or ulcers noted.  No cellulitis or open wounds. Lymph : No Cervical, Axillary, or Inguinal lymphadenopathy.     CBC Lab Results  Component Value Date   WBC 18.8 (H) 06/21/2023   HGB 10.6 (L) 06/21/2023   HCT 30.7 (L) 06/21/2023   MCV 86.7 06/21/2023   PLT 234 06/21/2023    BMET    Component Value Date/Time   NA 124 (L) 06/21/2023 1351   NA 136 08/02/2021 1629   NA 135 11/02/2014 0609   K 4.2 06/21/2023 1351   K 4.2 11/02/2014 0609   CL 93 (L) 06/21/2023 1351   CL 107 11/02/2014 0609   CO2 24 06/21/2023 1351   CO2 23 11/02/2014 0609   GLUCOSE 237 (H) 06/21/2023 1351   GLUCOSE 108 (H) 11/02/2014 0609   BUN 39 (H) 06/21/2023 1351   BUN 25 08/02/2021 1629   BUN 20 11/02/2014 0609   CREATININE  1.29 (H) 06/21/2023 1351   CREATININE 1.01 11/09/2015 1549   CREATININE 1.01 11/09/2015 1549   CALCIUM 8.0 (L) 06/21/2023 1351   CALCIUM 7.3 (L) 11/02/2014 0609   GFRNONAA 46 (L) 06/21/2023 1351   GFRNONAA 50 (L) 11/02/2014 0609   GFRAA >60 01/19/2019 0357   GFRAA 58 (L) 11/02/2014 0609   Estimated Creatinine Clearance: 42.8 mL/min (A) (by C-G formula based on SCr of 1.29 mg/dL (H)).  COAG Lab Results  Component Value Date   INR 0.9 10/17/2014   INR 1.1 01/19/2014   INR 0.9 12/06/2013    Radiology [place] Lower Ext Art Left  Ltd Result Date: 06/21/2023 CLINICAL DATA:  Swelling over access site after previous arterial procedure EXAM: LEFT LOWER EXTREMITY ARTERIAL DUPLEX SCAN TECHNIQUE: Coralynn Gaona-scale sonography as well as color Doppler and duplex ultrasound was performed to evaluate the lower extremity arteries including the common femoral artery. COMPARISON:  None Available. FINDINGS: Left lower Extremity ABI: Not calculated Normal common femoral arterial waveforms and velocities. Fem distal graft is patent. No evidence of inflow (aortoiliac) disease. There is a complex 10.6 x 7.4 x 3.4 cm hypoechoic collection overlying the common femoral vessels extending to the superficial overlying subcutaneous tissues. No suggestion of associated pseudoaneurysm, active extravasation, or AV fistula. IMPRESSION: 10.6 cm complex collection presumably hematoma overlying the common femoral vessels, without associated pseudoaneurysm, active extravasation, or AV fistula. Electronically Signed   By: Corlis Leak M.D.   On: 06/21/2023 17:49   US Venous Img Lower Unilateral Left (DVT) Result Date: 06/21/2023 CLINICAL DATA:  Left lower extremity edema. Patient is on anticoagulation. History of left fem-fem and fem-pop bypass grafts. Evaluate for DVT. EXAM: LEFT LOWER EXTREMITY VENOUS DOPPLER ULTRASOUND TECHNIQUE: Brook Geraci-scale sonography with graded compression, as well as color Doppler and duplex ultrasound were performed to evaluate the lower extremity deep venous systems from the level of the common femoral vein and including the common femoral, femoral, profunda femoral, popliteal and calf veins including the posterior tibial, peroneal and gastrocnemius veins when visible. The superficial great saphenous vein was also interrogated. Spectral Doppler was utilized to evaluate flow at rest and with distal augmentation maneuvers in the common femoral, femoral and popliteal veins. COMPARISON:  None Available. FINDINGS: Contralateral Common Femoral Vein:  Respiratory phasicity is normal and symmetric with the symptomatic side. No evidence of thrombus. Normal compressibility. Common Femoral Vein: No evidence of thrombus. Normal compressibility, respiratory phasicity and response to augmentation. Saphenofemoral Junction: No evidence of thrombus. Normal compressibility and flow on color Doppler imaging. Profunda Femoral Vein: No evidence of thrombus. Normal compressibility and flow on color Doppler imaging. Femoral Vein: No evidence of thrombus. Normal compressibility, respiratory phasicity and response to augmentation. Popliteal Vein: No evidence of thrombus. Normal compressibility, respiratory phasicity and response to augmentation. Calf Veins: No evidence of thrombus. Normal compressibility and flow on color Doppler imaging. Superficial Great Saphenous Vein: Not visualized, potentially the sequela of previous venous harvesting. Other Findings: Fem-fem bypass graft appears patent at the level of the right groin (image 28). IMPRESSION: No evidence of DVT within the left lower extremity. Electronically Signed   By: Simonne Come M.D.   On: 06/21/2023 16:47   VAS [place] ABI WITH/WO TBI Result Date: 05/28/2023  LOWER EXTREMITY DOPPLER STUDY Patient Name:  Laura Reed  Date of Exam:   05/28/2023 Medical Rec #: [id number]        Accession #:    [id number] Date of Birth: 04/12/1961        Patient Gender: F  Patient Age:   59 years Exam Location:  [facility] Procedure:      VAS [place] ABI WITH/WO TBI Referring Phys: GREGORY [name] --------------------------------------------------------------------------------  Indications: Peripheral artery disease. Other Factors: H/O right 4th & 5th and left 1st & 3rd toe amputations                12/03/2020 left pop thrombectomy and new stent.  Vascular Interventions: 05/01/2023 Left CF and AT stent                         04/29/2023 Left fem-pop bypass graft                          H/O left-to-right fem-fem BPG with lower  extremity                         stents & BPGs in [place];                         10/11/09: Left external iliac/common femoral artery PTA;                         07/25/11: Right profunda femoral to TP trunk BPG;                         12/31/11: Revision of fem-fem BPG & redo of anastomosis;                         09/16/12: Fem-fem BPG, left external iliac, left common                         femoral & left profunda femoral artery PTAs;                         06/01/13: Left external iliac to profunda femoral BPG                         with revision of the fem-fem & left fem-AK popliteal BPG                         proximal anastomoses;                         08/02/13: Left distal SFA/popliteal artery PTA;                         02/20/15: Left external iliac artery PTA/stent with left                         common iliac artery PTA;                         12/10/16: Left external iliac & common femoral artery                         PTAs. Performing Technologist: Hardie Lora RVT  Examination Guidelines: A complete evaluation includes at minimum, Doppler waveform signals and systolic blood pressure reading at the level of bilateral brachial, anterior tibial, and posterior  tibial arteries, when vessel segments are accessible. Bilateral testing is considered an integral part of a complete examination. Photoelectric Plethysmograph (PPG) waveforms and toe systolic pressure readings are included as required and additional duplex testing as needed. Limited examinations for reoccurring indications may be performed as noted.  ABI Findings: +---------+------------------+-----+----------+--------+ Right    Rt Pressure (mmHg)IndexWaveform  Comment  +---------+------------------+-----+----------+--------+ Brachial 104                                       +---------+------------------+-----+----------+--------+ PTA      112               0.91 biphasic            +---------+------------------+-----+----------+--------+ DP       112               0.91 monophasic         +---------+------------------+-----+----------+--------+ Great Toe83                0.67                    +---------+------------------+-----+----------+--------+ +--------+------------------+-----+----------+-------+ Left    Lt Pressure (mmHg)IndexWaveform  Comment +--------+------------------+-----+----------+-------+ [id number]                                      +--------+------------------+-----+----------+-------+ PTA     121               0.98 monophasic        +--------+------------------+-----+----------+-------+ DP      136               1.11 monophasic        +--------+------------------+-----+----------+-------+ +-------+-----------+-----------+------------+------------+ ABI/TBIToday's ABIToday's TBIPrevious ABIPrevious TBI +-------+-----------+-----------+------------+------------+ Right  0.91       0.67       0.84        0.50         +-------+-----------+-----------+------------+------------+ Left   1.11       Amputation 0.82        Amputation   +-------+-----------+-----------+------------+------------+ Right ABIs appear essentially unchanged compared to prior study on 12/25/2022. Left ABIs appear increased compared to prior study on 12/25/2022.  Summary: Right: Resting right ankle-brachial index indicates mild right lower extremity arterial disease. The right toe-brachial index is abnormal. Left: Resting left ankle-brachial index is within normal range. *See table(s) above for measurements and observations.  Electronically signed by [name] MD on 05/28/2023 at 5:40:53 PM.    Final       Assessment/Plan 1. I think she has a moderately large left groin seroma but doubt infection.  I do not think it is a hematoma, looks serous.  Minimally tender.  Pedal perfusion is excellent with no rest pain and healed TMA.  There is a  synthetic graft in this incision.  Plan is admit to Medicine.  Vascular to follow and aspirate seroma if infection remains a concern. 2. Diarrhea for etiology w/u 3. Electrolyte abnormalities for correction.   Iline Oven, MD  06/21/2023 6:23 PM

## 2023-06-21 NOTE — ED Provider Triage Note (Signed)
Emergency Medicine Provider Triage Evaluation Note  Laura Reed , a 62 y.o. female  was evaluated in triage.  Pt complains of post op problems, had vascular procedure, sent by [facility].  Review of Systems  Positive:  Negative:   Physical Exam  BP 136/86   Pulse 72   Temp 98 F (36.7 C)   Resp 19   Ht 5\' 7"  (1.702 m)   Wt 64.1 kg   SpO2 96%   BMI 22.15 kg/m  Gen:   Awake, no distress   Resp:  Normal effort  MSK:   Moves extremities without difficulty  Other:    Medical Decision Making  Medically screening exam initiated at 1:53 PM.  Appropriate orders placed.  LALEH MORGANELLI was informed that the remainder of the evaluation will be completed by another provider, this initial triage assessment does not replace that evaluation, and the importance of remaining in the ED until their evaluation is complete.     Faythe Ghee, PA-C 06/21/23 1353

## 2023-06-21 NOTE — ED Provider Notes (Signed)
[facility] Provider Note    Event Date/Time   First MD Initiated Contact with Patient 06/21/23 1504     (approximate)   History   Post-op Problem   HPI  Laura Reed is a 62 y.o. female who had a vascular procedure done and might have an infection in the incision site of the groin.  Patient reports having 4 toes amputated on the left foot having some leg swelling.  Patient reports that a few days ago she was using her left leg to help push a heavy box.  She noticed that around after that time she developed some increasing swelling in her entire leg.  She reports that she is having more swelling noted at her incision site on the left groin area.  She denies any drainage from it.  Denies any known fevers.  She does report having 2 days of diarrhea as well.   Physical Exam   Triage Vital Signs: ED Triage Vitals  Encounter Vitals Group     BP 06/21/23 1349 136/86     Systolic BP Percentile --      Diastolic BP Percentile --      Pulse Rate 06/21/23 1349 72     Resp 06/21/23 1349 19     Temp 06/21/23 1349 98 F (36.7 C)     Temp src --      SpO2 06/21/23 1349 96 %     Weight 06/21/23 1348 141 lb 6.4 oz (64.1 kg)     Height 06/21/23 1348 5\' 7"  (1.702 m)     Head Circumference --      Peak Flow --      Pain Score 06/21/23 1348 4     Pain Loc --      Pain Education --      Exclude from Growth Chart --     Most recent vital signs: Vitals:   06/21/23 1349  BP: 136/86  Pulse: 72  Resp: 19  Temp: 98 F (36.7 C)  SpO2: 96%     General: Awake, no distress.  CV:  Good peripheral perfusion.  Resp:  Normal effort.  Abd:  No distention.  Soft and nontender Other:  Incision site without any erythema but does have a bubblelike feeling over the incisional site.  She is got some edema noted in her leg 1+.  Her amputation site appears clear and has good distal pulse.   ED Results / Procedures / Treatments   Labs (all labs ordered are listed, but  only abnormal results are displayed) Labs Reviewed  CBC - Abnormal; Notable for the following components:      Result Value   WBC 18.8 (*)    RBC 3.54 (*)    Hemoglobin 10.6 (*)    HCT 30.7 (*)    All other components within normal limits  BASIC METABOLIC PANEL - Abnormal; Notable for the following components:   Sodium 124 (*)    Chloride 93 (*)    Glucose, Bld 237 (*)    BUN 39 (*)    Creatinine, Ser 1.29 (*)    Calcium 8.0 (*)    GFR, Estimated 46 (*)    All other components within normal limits    RADIOLOGY I have reviewed the ultrasound personally interpreted no signs of DVT   PROCEDURES:  Critical Care performed: No  Procedures   MEDICATIONS ORDERED IN ED: Medications  sodium chloride 0.9 % bolus 500 mL (0 mLs Intravenous Stopped 06/21/23 1635)  IMPRESSION / MDM / ASSESSMENT AND PLAN / ED COURSE  I reviewed the triage vital signs and the nursing notes.   Patient's presentation is most consistent with acute presentation with potential threat to life or bodily function.   Patient comes in with concerns for postop infection.  Do not see any overt cellulitis over the wound but they do report that it is more swollen and I can feel like a bubble underneath it therefore we will get ultrasound to evaluate for pseudoaneurysm, hematoma, abscess as well as DVT ultrasound.  Given her diarrhea labs were ordered to evaluate for Electra abnormalities, AKI.  BMP shows low sodium with slight AKI of 1.29.  Her CBC shows an elevated white count of 18 but her hemoglobin is stable.   IMPRESSION: 10.6 cm complex collection presumably hematoma overlying the common femoral vessels, without associated pseudoaneurysm, active extravasation, or AV fistula.  Discussed with vascular and Dr. Wallace Cullens came to evaluate patient.  At this time he suspects it is more likely a seroma and did not feel like it was infected.  Recommended holding off on antibiotics at this time but can admit for  observation, AKI, hyponatremia.  I wonder if her diarrhea is causing the elevated white count.  Her abdomen is soft and nontender.  I will discuss with the hospitalist for admission  The patient is on the cardiac monitor to evaluate for evidence of arrhythmia and/or significant heart rate changes.      FINAL CLINICAL IMPRESSION(S) / ED DIAGNOSES   Final diagnoses:  Hyponatremia  AKI (acute kidney injury) (HCC)     Rx / DC Orders   ED Discharge Orders     None        Note:  This document was prepared using Dragon voice recognition software and may include unintentional dictation errors.   Concha Se, MD 06/21/23 [phone]

## 2023-06-21 NOTE — H&P (Incomplete)
History and Physical    Laura Reed:[id number] DOB: 1960-10-02 DOA: 06/21/2023  Referring MD/NP/PA:   PCP: Sherlene Shams, MD   Patient coming from:  The patient is coming from home.     Chief Complaint: left leg pain, diarrhea  HPI: Laura Reed is a 62 y.o. female with medical history significant of HTN, HLD, DM,  sCHF, CAD, PAD (s/p of left common femoral-distal bypass, thrombectomy, angioplasty and stent placement 04/2023 on aspirin, Plavix and Eliquis), anxiety, left Upper Tract Transitional Cell Carcinoma (s/p  LEFT robotic nephroureterectomy on 10/31/2014), who presents with left leg pain and diarrhea  Pt is 2 months s/p redo left groin procedure by Dr. Lorretta Harp. She also underwent left foot TMA which has healed. She developed left leg swelling and pain which has been persistent.  The left leg pain is constant, moderate, aching, nonradiating. She also report new pain in left groin area.  No fever or chills.  Patient does not have chest pain, cough, SOB.  No nausea, vomiting or abdominal pain.  Patient reports diarrhea in the past 3 days, with 1-3 times of diarrhea each day.  Data reviewed independently and ED Course: pt was found to have WBC 18.8, BNP 557, AKI with creatinine 1.29, BUN 39, GFR 46 (recent baseline creatinine 0.67 on 05/04/2023), lactic acid 1.1, sodium 124, magnesium 2.6, phosphorus 2.3.  Lower extremity venous Doppler of left leg is negative for DVT.  Patient is admitted to telemetry bed as inpatient. Dr. Wallace Cullens of [facility] is consulted.  Left leg arterial Duplex: 10.6 cm complex collection presumably hematoma overlying the common femoral vessels, without associated pseudoaneurysm, actives/p extravasation, or AV fistula.   EKG:  Not done in ED, will get one.     Review of Systems:   General: no fevers, chills, no body weight gain, has fatigue HEENT: no blurry vision, hearing changes or sore throat Respiratory: no dyspnea, coughing, wheezing CV: no chest  pain, no palpitations GI: no nausea, vomiting, abdominal pain, diarrhea, constipation GU: no dysuria, burning on urination, increased urinary frequency, hematuria  Ext: has left leg pain and swelling Neuro: no unilateral weakness, numbness, or tingling, no vision change or hearing loss Skin: no rash, no skin tear. MSK: No muscle spasm, no deformity, no limitation of range of movement in spin Heme: No easy bruising.  Travel history: No recent long distant travel.   Allergy: No Known Allergies  Past Medical History:  Diagnosis Date   Absence of kidney    left   Anxiety    Arthritis    Atherosclerosis of native arteries of extremities with intermittent claudication, bilateral legs (HCC)    Bladder cancer (HCC)    CHF (congestive heart failure) (HCC)    Complication of anesthesia    BP HAS  RUN LOW AFTER SURGERY-LUNGS FILLED UP WITH FLUID AFTER  LEG STENT SURGERY    Coronary artery disease    Diabetes mellitus    Family history of adverse reaction to anesthesia    Sister - PONV   GERD (gastroesophageal reflux disease)    OCC TUMS   Heart murmur    Hemorrhoid    History of methicillin resistant staphylococcus aureus (MRSA) 2007   Hypertension    Neuropathy    PVD (peripheral vascular disease) (HCC)    Thyroid nodule    right   Unspecified osteoarthritis, unspecified site    Urothelial carcinoma of kidney (HCC) 10/31/2014   INVASIVE UROTHELIAL CARCINOMA, LOW GRADE. T1, Nx.   Vitamin D  deficiency, unspecified    Wears dentures    full upper and lower    Past Surgical History:  Procedure Laterality Date   AMPUTATION TOE     right (4th and 5th); left (great toe, 3rd)   AMPUTATION TOE Right 07/16/2018   Procedure: AMPUTATION TOE/MPJ right 2nd;  Surgeon: Linus Galas, DPM;  Location: [facility];  Service: Podiatry;  Laterality: Right;   ARTERIAL BYPASS SURGRY  2009, 2013 x 2   right leg , done in [place]   CARDIAC CATHETERIZATION     CAROTID ENDARTERECTOMY Right 01/2014   Dr  Gilda Crease   CATARACT EXTRACTION W/PHACO Right 12/14/2014   Procedure: CATARACT EXTRACTION PHACO AND INTRAOCULAR LENS PLACEMENT (IOC);  Surgeon: Lia Hopping, MD;  Location: [facility];  Service: Ophthalmology;  Laterality: Right;  [place]   00:38.6              AP        7.1                   CDE  2.76   CATARACT EXTRACTION W/PHACO Left 12/06/2019   Procedure: CATARACT EXTRACTION PHACO AND INTRAOCULAR LENS PLACEMENT (IOC) LEFT DIABETIC;  Surgeon: Galen Manila, MD;  Location: [facility];  Service: Ophthalmology;  Laterality: Left;  9.08 1:06.4   CESAREAN SECTION     CHOLECYSTECTOMY  03-03-12   Porcelain gallbladder, gallstones,  [place]   COLONOSCOPY W/ BIOPSIES  04/28/2012   Hyperplastic rectal polyps.   COLONOSCOPY WITH PROPOFOL N/A 04/02/2022   Procedure: COLONOSCOPY WITH PROPOFOL;  Surgeon: Earline Mayotte, MD;  Location: [facility];  Service: Endoscopy;  Laterality: N/A;   CORONARY ARTERY BYPASS GRAFT  2009   3 vessel   CYSTOSCOPY W/ RETROGRADES Right 09/01/2016   Procedure: CYSTOSCOPY WITH RETROGRADE PYELOGRAM;  Surgeon: Vanna Scotland, MD;  Location: [facility];  Service: Urology;  Laterality: Right;   CYSTOSCOPY W/ RETROGRADES Bilateral 03/19/2020   Procedure: CYSTOSCOPY WITH RETROGRADE PYELOGRAM;  Surgeon: Vanna Scotland, MD;  Location: [facility];  Service: Urology;  Laterality: Bilateral;   CYSTOSCOPY WITH BIOPSY N/A 03/19/2020   Procedure: CYSTOSCOPY WITH BIOPSY;  Surgeon: Vanna Scotland, MD;  Location: [facility];  Service: Urology;  Laterality: N/A;   EYE SURGERY     FEMORAL-POPLITEAL BYPASS GRAFT Left 04/29/2023   Procedure: BYPASS GRAFT FEMORAL-POPLITEAL ARTERY;  Surgeon: Renford Dills, MD;  Location: [facility];  Service: Vascular;  Laterality: Left;   HERNIA REPAIR  10-31-14   ventral, retro-rectus atrium mesh   IRRIGATION AND DEBRIDEMENT FOOT Left 01/18/2019   Procedure: IRRIGATION AND DEBRIDEMENT FOOT;  Surgeon: Linus Galas, DPM;  Location: [facility];  Service:  Podiatry;  Laterality: Left;   LOWER EXTREMITY ANGIOGRAPHY Left 12/10/2016   Procedure: Lower Extremity Angiography;  Surgeon: Renford Dills, MD;  Location: [facility];  Service: Cardiovascular;  Laterality: Left;   LOWER EXTREMITY ANGIOGRAPHY Left 02/02/2018   Procedure: LOWER EXTREMITY ANGIOGRAPHY;  Surgeon: Renford Dills, MD;  Location: [facility];  Service: Cardiovascular;  Laterality: Left;   LOWER EXTREMITY ANGIOGRAPHY Left 05/05/2018   Procedure: LOWER EXTREMITY ANGIOGRAPHY;  Surgeon: Renford Dills, MD;  Location: [facility];  Service: Cardiovascular;  Laterality: Left;   LOWER EXTREMITY ANGIOGRAPHY Left 12/04/2020   Procedure: LOWER EXTREMITY ANGIOGRAPHY with Intervention;  Surgeon: Renford Dills, MD;  Location: [facility];  Service: Cardiovascular;  Laterality: Left;   LOWER EXTREMITY ANGIOGRAPHY Left 04/24/2023   Procedure: Lower Extremity Angiography;  Surgeon:  [name], Latina Craver, MD;  Location: [facility];  Service: Cardiovascular;  Laterality: Left;   LOWER EXTREMITY ANGIOGRAPHY Left 05/01/2023   Procedure: Lower Extremity Angiography;  Surgeon: Renford Dills, MD;  Location: [facility];  Service: Cardiovascular;  Laterality: Left;   NEPHRECTOMY Left 10-31-14   PERIPHERAL VASCULAR CATHETERIZATION Left 05/01/2015   Procedure: Lower Extremity Angiography;  Surgeon: Renford Dills, MD;  Location: [facility];  Service: Cardiovascular;  Laterality: Left;   PERIPHERAL VASCULAR CATHETERIZATION  05/01/2015   Procedure: Lower Extremity Intervention;  Surgeon: Renford Dills, MD;  Location: [facility];  Service: Cardiovascular;;   PERIPHERAL VASCULAR CATHETERIZATION Left 02/20/2015   Procedure: Pelvic Angiography;  Surgeon: Renford Dills, MD;  Location: [facility];  Service: Cardiovascular;  Laterality: Left;   TRANSMETATARSAL AMPUTATION Left 05/05/2023   Procedure:  TRANSMETATARSAL AMPUTATION LEFT;  Surgeon: Gwyneth Revels, DPM;  Location: [facility];  Service: Orthopedics/Podiatry;  Laterality: Left;   TRANSURETHRAL RESECTION OF BLADDER TUMOR WITH MITOMYCIN-C N/A 09/01/2016   Procedure: TRANSURETHRAL RESECTION OF BLADDER TUMOR WITH MITOMYCIN-C;  Surgeon: Vanna Scotland, MD;  Location: [facility];  Service: Urology;  Laterality: N/A;    Social History:  reports that she quit smoking about 10 years ago. Her smoking use included cigarettes. She started smoking about 45 years ago. She has a 70 pack-year smoking history. She has never used smokeless tobacco. She reports that she does not currently use alcohol. She reports that she does not currently use drugs after having used the following drugs: Cocaine.  Family History:  Family History  Problem Relation Age of Onset   Cancer Mother 80       Lung Cancer   Cancer Father 66       Lung Ca   Diabetes Son    Breast cancer Maternal Grandmother    Kidney cancer Neg Hx    Bladder Cancer Neg Hx    Prostate cancer Neg Hx      Prior to Admission medications   Medication Sig Start Date End Date Taking? Authorizing Provider  acetaminophen (TYLENOL) 500 MG tablet Take 1,000 mg by mouth every 6 (six) hours as needed for mild pain or headache.     [provider]  ALPRAZolam Prudy Feeler) 0.5 MG tablet Take 1 tablet (0.5 mg total) by mouth 2 (two) times daily as needed for anxiety. 05/11/23   Pace, Peggye Ley, NP  apixaban (ELIQUIS) 2.5 MG TABS tablet Take 1 tablet (2.5 mg total) by mouth 2 (two) times daily. 05/11/23   Marcie Bal, NP  aspirin 81 MG chewable tablet Chew 81 mg by mouth at bedtime.     [provider]  atorvastatin (LIPITOR) 20 MG tablet TAKE 1 TABLET(20 MG) BY MOUTH DAILY 01/22/23   Sherlene Shams, MD  Cholecalciferol (VITAMIN D3) 2000 UNITS TABS Take 2,000 Units by mouth daily after supper.     [provider]  clopidogrel (PLAVIX) 75 MG tablet Take 1 tablet (75 mg total) by mouth daily  at 6 (six) AM. 05/12/23   Pace, Peggye Ley, NP  Continuous Glucose Sensor (FREESTYLE LIBRE 2 SENSOR) MISC Use to check sugar at least 4 times daily 05/05/23   Sherlene Shams, MD  empagliflozin (JARDIANCE) 25 MG TABS tablet TAKE 1 TABLET(25 MG) BY MOUTH DAILY BEFORE AND BREAKFAST 09/08/22   Sherlene Shams, MD  gabapentin (NEURONTIN) 300 MG capsule Take 300 mg by mouth 2 (two) times daily.  [provider]  glucose blood test strip Use you check blood sugars up to four times daily. 03/25/22   Sherlene Shams, MD  insulin degludec (TRESIBA FLEXTOUCH) 100 UNIT/ML FlexTouch Pen Inject 20 Units into the skin daily. Patient taking differently: Inject 22 Units into the skin daily. 09/08/22   Sherlene Shams, MD  insulin lispro (HUMALOG KWIKPEN) 100 UNIT/ML KwikPen ADMINISTER 10 UNITS UNDER THE SKIN THREE TIMES DAILY BEFORE MEALS Patient taking differently: ADMINISTER 10 UNITS UNDER THE SKIN THREE TIMES DAILY BEFORE MEALS PRN 12/28/21   Worthy Rancher B, FNP  Insulin Pen Needle (PEN NEEDLES) 32G X 4 MM MISC Use to take insulin daily 03/31/23   Sherlene Shams, MD  losartan (COZAAR) 50 MG tablet Take 50 mg by mouth daily.    [provider]  Multiple Vitamin (MULTIVITAMIN) tablet Take 1 tablet by mouth daily.    [provider]  multivitamin-lutein (OCUVITE-LUTEIN) CAPS capsule Take 1 capsule by mouth daily.    [provider]  triamcinolone ointment (KENALOG) 0.5 % Apply 1 Application topically 2 (two) times daily. 01/27/23   Sherlene Shams, MD    Physical Exam: Vitals:   06/21/23 1348 06/21/23 1349 06/21/23 2133  BP:  136/86   Pulse:  72   Resp:  19   Temp:  98 F (36.7 C) 99.1 F (37.3 C)  TempSrc:   Oral  SpO2:  96%   Weight: 64.1 kg    Height: 5\' 7"  (1.702 m)     General: Not in acute distress HEENT:       Eyes: PERRL, EOMI, no jaundice       ENT: No discharge from the ears and nose, no pharynx injection, no tonsillar enlargement.        Neck: No JVD, no  bruit, no mass felt. Heme: No neck lymph node enlargement. Cardiac: S1/S2, RRR, No gallops or rubs. Respiratory: No rales, wheezing, rhonchi or rubs. GI: Soft, nondistended, nontender, no rebound pain, no organomegaly, BS present. GU: No hematuria Ext: has tendneess in whole left leg and left groin area. Has 1+ left leg edema Musculoskeletal: No joint deformities, No joint redness or warmth, no limitation of ROM in spin. Skin: No rashes.  Neuro: Alert, oriented X3, cranial nerves II-XII grossly intact, moves all extremities normally. M Psych: Patient is not psychotic, no suicidal or hemocidal ideation.  Labs on Admission: I have personally reviewed following labs and imaging studies  CBC: Recent Labs  Lab 06/21/23 1351  WBC 18.8*  HGB 10.6*  HCT 30.7*  MCV 86.7  PLT 234   Basic Metabolic Panel: Recent Labs  Lab 06/21/23 1351 06/21/23 2304  NA 124* 128*  K 4.2 4.2  CL 93* 98  CO2 24 25  GLUCOSE 237* 198*  BUN 39* 39*  CREATININE 1.29* 1.14*  CALCIUM 8.0* 7.7*  MG 2.6*  --   PHOS 2.3*  --    GFR: Estimated Creatinine Clearance: 48.5 mL/min (A) (by C-G formula based on SCr of 1.14 mg/dL (H)). Liver Function Tests: No results for input(s): "AST", "ALT", "ALKPHOS", "BILITOT", "PROT", "ALBUMIN" in the last 168 hours. No results for input(s): "LIPASE", "AMYLASE" in the last 168 hours. No results for input(s): "AMMONIA" in the last 168 hours. Coagulation Profile: Recent Labs  Lab 06/21/23 2304  INR 1.1   Cardiac Enzymes: No results for input(s): "CKTOTAL", "CKMB", "CKMBINDEX", "TROPONINI" in the last 168 hours. BNP (last 3 results) No results for input(s): "PROBNP" in the last 8760 hours.  HbA1C: No results for input(s): "HGBA1C" in the last 72 hours. CBG: Recent Labs  Lab 06/21/23 2132  GLUCAP 201*   Lipid Profile: No results for input(s): "CHOL", "HDL", "LDLCALC", "TRIG", "CHOLHDL", "LDLDIRECT" in the last 72 hours. Thyroid Function Tests: No results for  input(s): "TSH", "T4TOTAL", "FREET4", "T3FREE", "THYROIDAB" in the last 72 hours. Anemia Panel: No results for input(s): "VITAMINB12", "FOLATE", "FERRITIN", "TIBC", "IRON", "RETICCTPCT" in the last 72 hours. Urine analysis:    Component Value Date/Time   COLORURINE YELLOW (A) 05/11/2023 1317   APPEARANCEUR TURBID (A) 05/11/2023 1317   APPEARANCEUR Hazy (A) 08/12/2022 1430   LABSPEC 1.019 05/11/2023 1317   LABSPEC 1.010 01/09/2014 1108   PHURINE 5.0 05/11/2023 1317   GLUCOSEU NEGATIVE 05/11/2023 1317   GLUCOSEU Negative 01/09/2014 1108   HGBUR LARGE (A) 05/11/2023 1317   BILIRUBINUR NEGATIVE 05/11/2023 1317   BILIRUBINUR Negative 08/12/2022 1430   BILIRUBINUR Negative 01/09/2014 1108   KETONESUR 5 (A) 05/11/2023 1317   PROTEINUR 100 (A) 05/11/2023 1317   UROBILINOGEN 0.2 04/09/2016 1159   NITRITE POSITIVE (A) 05/11/2023 1317   LEUKOCYTESUR MODERATE (A) 05/11/2023 1317   LEUKOCYTESUR 3+ 01/09/2014 1108   Sepsis Labs: @LABRCNTIP (procalcitonin:4,lacticidven:4) )No results found for this or any previous visit (from the past 240 hours).   Radiological Exams on Admission: [place] Lower Ext Art Left Ltd Result Date: 06/21/2023 CLINICAL DATA:  Swelling over access site after previous arterial procedure EXAM: LEFT LOWER EXTREMITY ARTERIAL DUPLEX SCAN TECHNIQUE: Gray-scale sonography as well as color Doppler and duplex ultrasound was performed to evaluate the lower extremity arteries including the common femoral artery. COMPARISON:  None Available. FINDINGS: Left lower Extremity ABI: Not calculated Normal common femoral arterial waveforms and velocities. Fem distal graft is patent. No evidence of inflow (aortoiliac) disease. There is a complex 10.6 x 7.4 x 3.4 cm hypoechoic collection overlying the common femoral vessels extending to the superficial overlying subcutaneous tissues. No suggestion of associated pseudoaneurysm, active extravasation, or AV fistula. IMPRESSION: 10.6 cm complex collection  presumably hematoma overlying the common femoral vessels, without associated pseudoaneurysm, active extravasation, or AV fistula. Electronically Signed   By: Corlis Leak M.D.   On: 06/21/2023 17:49   US Venous Img Lower Unilateral Left (DVT) Result Date: 06/21/2023 CLINICAL DATA:  Left lower extremity edema. Patient is on anticoagulation. History of left fem-fem and fem-pop bypass grafts. Evaluate for DVT. EXAM: LEFT LOWER EXTREMITY VENOUS DOPPLER ULTRASOUND TECHNIQUE: Gray-scale sonography with graded compression, as well as color Doppler and duplex ultrasound were performed to evaluate the lower extremity deep venous systems from the level of the common femoral vein and including the common femoral, femoral, profunda femoral, popliteal and calf veins including the posterior tibial, peroneal and gastrocnemius veins when visible. The superficial great saphenous vein was also interrogated. Spectral Doppler was utilized to evaluate flow at rest and with distal augmentation maneuvers in the common femoral, femoral and popliteal veins. COMPARISON:  None Available. FINDINGS: Contralateral Common Femoral Vein: Respiratory phasicity is normal and symmetric with the symptomatic side. No evidence of thrombus. Normal compressibility. Common Femoral Vein: No evidence of thrombus. Normal compressibility, respiratory phasicity and response to augmentation. Saphenofemoral Junction: No evidence of thrombus. Normal compressibility and flow on color Doppler imaging. Profunda Femoral Vein: No evidence of thrombus. Normal compressibility and flow on color Doppler imaging. Femoral Vein: No evidence of thrombus. Normal compressibility, respiratory phasicity and response to augmentation. Popliteal Vein: No evidence of thrombus. Normal compressibility, respiratory phasicity and response to augmentation. Calf Veins: No evidence of  thrombus. Normal compressibility and flow on color Doppler imaging. Superficial Great Saphenous Vein: Not  visualized, potentially the sequela of previous venous harvesting. Other Findings: Fem-fem bypass graft appears patent at the level of the right groin (image 28). IMPRESSION: No evidence of DVT within the left lower extremity. Electronically Signed   By: Simonne Come M.D.   On: 06/21/2023 16:47      Assessment/Plan Principal Problem:   Left leg pain Active Problems:   PVD (peripheral vascular disease) (HCC)   AKI (acute kidney injury) (HCC)   Leukocytosis   Diarrhea   Hypertension   Hyperlipidemia associated with type 2 diabetes mellitus (HCC)   CAD (coronary artery disease)   Type II diabetes mellitus with peripheral circulatory disorder (HCC)   Chronic diastolic CHF (congestive heart failure) (HCC)   Normocytic anemia   Hyponatremia   Hypophosphatemia   Assessment and Plan:   Addendum: pt's Bp dropped to 57/40. Stat CBC showed Hgb 9.4 which was 10.6 initially.  I examined the patient, patient does not look like to have such low blood pressure.  She has warm extremities and no confusion.  We measured her blood pressure in leg which is in 90s in one measurement, then becomes SBP 50s.    -will give 500 of NS, then 75 cc/h (pt has elevated BNP 557). -start Midodrine 10 mg tid --hold IV heparin, and check CBC in 3 hours. If Hgb is stable, will restart IV heparin. I communicated with floor coverage PA, Jawo. He will f/u Hgb level. -I consulted ICU NP, Ouma --> pt will be transferred to ICU.  Left leg pain and left groin pain: Lower extremity venous Doppler negative for DVT. Left leg arterial duplex showed10.6 cm complex collection. Consulted Dr. Wallace Cull[facility] of [facility].  Per Dr. Wallace Cullens of [facility], this is likely seroma, rather than hematoma. His Hgb is stable 10.6 (Hgb 9.6 on 05/10/23). Dr. Wallace Cullens dose not think this is infected.   -will admit to tele bed as inpt --> SDU -prn percocet and Tylenol for pain -Switch Eliquis to IV heparin in case patient needs surgery -INR/PTT/type screen -Check CBC in  morning   PVD (peripheral vascular disease) (HCC): -will continue ASA -will switch IV Eliquis to IV heparin -Hold Plavix temporarily in case pain needs surgery, also because that I cannot completely rule out hematoma. Pt will be on IV heparin. -lipitor  AKI (acute kidney injury) (HCC): -Hold Cozaar -IV fluid: 500 cc normal saline -Avoid using renal toxic medications  Leukocytosis: WBC 18.8, procalcitonin 12.39.  No clear source identified. -Started on vancomycin and Zosyn empirically -Follow-up of blood culture  Diarrhea -Check C. difficile and GI pathogen panel  Hypertension -IV hydralazine as needed -Hold Cozaar due to AKI  Hyperlipidemia associated with type 2 diabetes mellitus (HCC) -Lipitor  CAD (coronary artery disease) -Aspirin and Lipitor  Type II diabetes mellitus with peripheral circulatory disorder [facility]): Recent A1c 8.0.  Patient is taking Jardiance and glargine insulin 22 units daily -Sliding scale insulin -Glargine insulin 14 units daily  Chronic diastolic CHF (congestive heart failure) (HCC): 2D echo on 12/19/2021 showed EF> 55%.  Patient has elevated BNP 557, but no shortness of breath, does not seem to have CHF exacerbation. -Watch volume status closely  Normocytic anemia: Hemoglobin stable, 10.0 (9.6 on 05/10/2023) -Follow-up with CBC  Hyponatremia: Initial sodium 124 which improved to 128 after giving 500 cc normal saline - Will check urine sodium, urine osmolality, serum osmolality. - Fluid restriction - IVF: 500 m NS in ED -  Sodium chloride tablet 1 g twice daily - f/u by BMP q8h - avoid over correction too fast due to risk of central pontine myelinolysis  Hypophosphatemia: Phosphorus 2.3 -15 mmol potassium phosphate       DVT ppx: on IV Heparin  --> will hold IV heparin, pending  CBC at 4:00AM. Start SCD now  Code Status: Full code   Family Communication:  Yes, patient's sister at bed side.      Disposition Plan:  Anticipate discharge  back to previous environment  Consults called:   Dr. [facility] Cullens of [facility] is consulted.   Admission status and Level of care: Telemetry Medical:   as inpt      Dispo: The patient is from: Home              Anticipated d/c is to: Home              Anticipated d/c date is: 2 days              Patient currently is not medically stable to d/c.    Severity of Illness:  The appropriate patient status for this patient is INPATIENT. Inpatient status is judged to be reasonable and necessary in order to provide the required intensity of service to ensure the patient's safety. The patient's presenting symptoms, physical exam findings, and initial radiographic and laboratory data in the context of their chronic comorbidities is felt to place them at high risk for further clinical deterioration. Furthermore, it is not anticipated that the patient will be medically stable for discharge from the hospital within 2 midnights of admission.   * I certify that at the point of admission it is my clinical judgment that the patient will require inpatient hospital care spanning beyond 2 midnights from the point of admission due to high intensity of service, high risk for further deterioration and high frequency of surveillance required.*       Date of Service 06/22/2023    Lorretta Harp [facility]   If 7PM-7AM, please contact night-coverage [email or web address] 06/22/2023, 12:25 AM

## 2023-06-21 NOTE — ED Triage Notes (Signed)
Pt comes with possible post op issue. Pt has vascular procedure done and now she may have infection in incision site in groin. Pt states she has 4 toes amputated to on the left foot and having some leg swelling. Pt states that should be fine. Pt states her BP was low also so they brought her over here.

## 2023-06-22 ENCOUNTER — Inpatient Hospital Stay: Payer: 59

## 2023-06-22 DIAGNOSIS — Z79899 Other long term (current) drug therapy: Secondary | ICD-10-CM

## 2023-06-22 DIAGNOSIS — Z7901 Long term (current) use of anticoagulants: Secondary | ICD-10-CM

## 2023-06-22 DIAGNOSIS — Z794 Long term (current) use of insulin: Secondary | ICD-10-CM

## 2023-06-22 DIAGNOSIS — E1151 Type 2 diabetes mellitus with diabetic peripheral angiopathy without gangrene: Secondary | ICD-10-CM | POA: Diagnosis not present

## 2023-06-22 DIAGNOSIS — Z87891 Personal history of nicotine dependence: Secondary | ICD-10-CM

## 2023-06-22 DIAGNOSIS — Z7902 Long term (current) use of antithrombotics/antiplatelets: Secondary | ICD-10-CM

## 2023-06-22 DIAGNOSIS — I2510 Atherosclerotic heart disease of native coronary artery without angina pectoris: Secondary | ICD-10-CM

## 2023-06-22 DIAGNOSIS — I70203 Unspecified atherosclerosis of native arteries of extremities, bilateral legs: Secondary | ICD-10-CM | POA: Diagnosis not present

## 2023-06-22 DIAGNOSIS — L02214 Cutaneous abscess of groin: Secondary | ICD-10-CM | POA: Diagnosis not present

## 2023-06-22 DIAGNOSIS — T827XXA Infection and inflammatory reaction due to other cardiac and vascular devices, implants and grafts, initial encounter: Secondary | ICD-10-CM | POA: Diagnosis not present

## 2023-06-22 LAB — COMPREHENSIVE METABOLIC PANEL
ALT: 16 U/L (ref 0–44)
AST: 24 U/L (ref 15–41)
Albumin: 2.4 g/dL — ABNORMAL LOW (ref 3.5–5.0)
Alkaline Phosphatase: 105 U/L (ref 38–126)
Anion gap: 8 (ref 5–15)
BUN: 37 mg/dL — ABNORMAL HIGH (ref 8–23)
CO2: 19 mmol/L — ABNORMAL LOW (ref 22–32)
Calcium: 7.5 mg/dL — ABNORMAL LOW (ref 8.9–10.3)
Chloride: 99 mmol/L (ref 98–111)
Creatinine, Ser: 1.24 mg/dL — ABNORMAL HIGH (ref 0.44–1.00)
GFR, Estimated: 49 mL/min — ABNORMAL LOW (ref >60)
Glucose, Bld: 258 mg/dL — ABNORMAL HIGH (ref 70–99)
Potassium: 4.2 mmol/L (ref 3.5–5.1)
Sodium: 126 mmol/L — ABNORMAL LOW (ref 135–145)
Total Bilirubin: 0.6 mg/dL (ref <1.2)
Total Protein: 5.4 g/dL — ABNORMAL LOW (ref 6.5–8.1)

## 2023-06-22 LAB — CBC
HCT: 27.8 % — ABNORMAL LOW (ref 36.0–46.0)
HCT: 29.2 % — ABNORMAL LOW (ref 36.0–46.0)
Hemoglobin: 9.4 g/dL — ABNORMAL LOW (ref 12.0–15.0)
Hemoglobin: 9.8 g/dL — ABNORMAL LOW (ref 12.0–15.0)
MCH: 30.3 pg (ref 26.0–34.0)
MCH: 30 pg (ref 26.0–34.0)
MCHC: 33.8 g/dL (ref 30.0–36.0)
MCHC: 33.6 g/dL (ref 30.0–36.0)
MCV: 89.7 fL (ref 80.0–100.0)
MCV: 89.3 fL (ref 80.0–100.0)
Platelets: 201 10*3/uL (ref 150–400)
Platelets: 238 10*3/uL (ref 150–400)
RBC: 3.1 MIL/uL — ABNORMAL LOW (ref 3.87–5.11)
RBC: 3.27 MIL/uL — ABNORMAL LOW (ref 3.87–5.11)
RDW: 12.9 % (ref 11.5–15.5)
RDW: 12.9 % (ref 11.5–15.5)
WBC: 10.8 10*3/uL — ABNORMAL HIGH (ref 4.0–10.5)
WBC: 14.9 10*3/uL — ABNORMAL HIGH (ref 4.0–10.5)
nRBC: 0 % (ref 0.0–0.2)
nRBC: 0 % (ref 0.0–0.2)

## 2023-06-22 LAB — TYPE AND SCREEN
ABO/RH(D): A POS
Antibody Screen: NEGATIVE

## 2023-06-22 LAB — TSH: TSH: 0.864 u[IU]/mL (ref 0.350–4.500)

## 2023-06-22 LAB — CBG MONITORING, ED
Glucose-Capillary: 204 mg/dL — ABNORMAL HIGH (ref 70–99)
Glucose-Capillary: 122 mg/dL — ABNORMAL HIGH (ref 70–99)
Glucose-Capillary: 144 mg/dL — ABNORMAL HIGH (ref 70–99)
Glucose-Capillary: 162 mg/dL — ABNORMAL HIGH (ref 70–99)

## 2023-06-22 LAB — HIV ANTIBODY (ROUTINE TESTING W REFLEX): HIV Screen 4th Generation wRfx: NONREACTIVE

## 2023-06-22 LAB — T4, FREE: Free T4: 1.14 ng/dL — ABNORMAL HIGH (ref 0.61–1.12)

## 2023-06-22 LAB — HEPARIN LEVEL (UNFRACTIONATED): Heparin Unfractionated: 0.82 [IU]/mL — ABNORMAL HIGH (ref 0.30–0.70)

## 2023-06-22 LAB — PHOSPHORUS: Phosphorus: 3.9 mg/dL (ref 2.5–4.6)

## 2023-06-22 MED ORDER — SODIUM CHLORIDE 0.9 % IV SOLN
2.0000 g | Freq: Two times a day (BID) | INTRAVENOUS | Status: DC
  Administered 2023-06-22 – 2023-06-23 (×2): 2 g via INTRAVENOUS
  Filled 2023-06-22 (×2): qty 12.5

## 2023-06-22 MED ORDER — SODIUM CHLORIDE 0.9 % IV SOLN
INTRAVENOUS | Status: DC
Start: 2023-06-22 — End: 2023-06-25

## 2023-06-22 MED ORDER — HEPARIN BOLUS VIA INFUSION
4000.0000 [IU] | Freq: Once | INTRAVENOUS | Status: DC
  Filled 2023-06-22: qty 4000

## 2023-06-22 MED ORDER — SODIUM CHLORIDE 0.9 % IV BOLUS
500.0000 mL | Freq: Once | INTRAVENOUS | Status: AC
  Administered 2023-06-22: 500 mL via INTRAVENOUS

## 2023-06-22 MED ORDER — ASPIRIN 81 MG PO CHEW
81.0000 mg | CHEWABLE_TABLET | Freq: Every day | ORAL | Status: DC
  Administered 2023-06-22 – 2023-07-23 (×33): 81 mg via ORAL
  Filled 2023-06-22 (×33): qty 1

## 2023-06-22 MED ORDER — SODIUM CHLORIDE 0.9 % IV SOLN
250.0000 mL | INTRAVENOUS | Status: AC
  Administered 2023-06-22: 250 mL via INTRAVENOUS

## 2023-06-22 MED ORDER — VITAMIN D 25 MCG (1000 UNIT) PO TABS
2000.0000 [IU] | ORAL_TABLET | Freq: Every day | ORAL | Status: DC
  Administered 2023-06-22 – 2023-07-24 (×32): 2000 [IU] via ORAL
  Filled 2023-06-22 (×32): qty 2

## 2023-06-22 MED ORDER — GABAPENTIN 300 MG PO CAPS
300.0000 mg | ORAL_CAPSULE | Freq: Two times a day (BID) | ORAL | Status: DC
  Administered 2023-06-22 – 2023-07-24 (×65): 300 mg via ORAL
  Filled 2023-06-22 (×65): qty 1

## 2023-06-22 MED ORDER — ATORVASTATIN CALCIUM 20 MG PO TABS
20.0000 mg | ORAL_TABLET | Freq: Every day | ORAL | Status: DC
  Administered 2023-06-22 – 2023-07-24 (×32): 20 mg via ORAL
  Filled 2023-06-22 (×17): qty 1
  Filled 2023-06-22: qty 2
  Filled 2023-06-22 (×14): qty 1

## 2023-06-22 MED ORDER — PIPERACILLIN-TAZOBACTAM 3.375 G IVPB
3.3750 g | Freq: Three times a day (TID) | INTRAVENOUS | Status: DC
  Administered 2023-06-22 (×2): 3.375 g via INTRAVENOUS
  Filled 2023-06-22 (×2): qty 50

## 2023-06-22 MED ORDER — VANCOMYCIN HCL IN DEXTROSE 1-5 GM/200ML-% IV SOLN
1000.0000 mg | INTRAVENOUS | Status: DC
  Administered 2023-06-23: 1000 mg via INTRAVENOUS
  Filled 2023-06-22: qty 200

## 2023-06-22 MED ORDER — ADULT MULTIVITAMIN W/MINERALS CH
1.0000 | ORAL_TABLET | Freq: Every day | ORAL | Status: DC
  Administered 2023-06-23 – 2023-07-24 (×31): 1 via ORAL
  Filled 2023-06-22 (×31): qty 1

## 2023-06-22 MED ORDER — ALPRAZOLAM 0.5 MG PO TABS
0.5000 mg | ORAL_TABLET | Freq: Two times a day (BID) | ORAL | Status: DC | PRN
Start: 2023-06-22 — End: 2023-07-25
  Administered 2023-06-26 – 2023-07-23 (×28): 0.5 mg via ORAL
  Filled 2023-06-22 (×28): qty 1

## 2023-06-22 MED ORDER — LOSARTAN POTASSIUM 50 MG PO TABS: 50.0000 mg | ORAL_TABLET | Freq: Every day | ORAL | Status: DC

## 2023-06-22 MED ORDER — IOHEXOL 300 MG/ML  SOLN
100.0000 mL | Freq: Once | INTRAMUSCULAR | Status: AC | PRN
  Administered 2023-06-22: 100 mL via INTRAVENOUS

## 2023-06-22 MED ORDER — VANCOMYCIN HCL IN DEXTROSE 1-5 GM/200ML-% IV SOLN
1000.0000 mg | INTRAVENOUS | Status: AC
  Administered 2023-06-22: 1000 mg via INTRAVENOUS
  Filled 2023-06-22: qty 200

## 2023-06-22 MED ORDER — SODIUM CHLORIDE 0.9 % IV BOLUS
1000.0000 mL | Freq: Once | INTRAVENOUS | Status: AC
  Administered 2023-06-22: 1000 mL via INTRAVENOUS

## 2023-06-22 MED ORDER — HEPARIN (PORCINE) 25000 UT/250ML-% IV SOLN: 1050.0000 [IU]/h | INTRAVENOUS | Status: DC

## 2023-06-22 MED ORDER — NOREPINEPHRINE 4 MG/250ML-% IV SOLN
2.0000 ug/min | INTRAVENOUS | Status: DC
  Administered 2023-06-22 (×3): 2 ug/min via INTRAVENOUS
  Filled 2023-06-22 (×3): qty 250

## 2023-06-22 MED ORDER — MIDODRINE HCL 5 MG PO TABS
10.0000 mg | ORAL_TABLET | Freq: Three times a day (TID) | ORAL | Status: DC
  Administered 2023-06-22 – 2023-06-27 (×15): 10 mg via ORAL
  Filled 2023-06-22 (×15): qty 2

## 2023-06-22 NOTE — Progress Notes (Signed)
ANTICOAGULATION CONSULT NOTE  Pharmacy Consult for heparin infusion Indication: PVD  No Known Allergies  Patient Measurements: Height: 5\' 7"  (170.2 cm) Weight: 64.1 kg (141 lb 6.4 oz) IBW/kg (Calculated) : 61.6 Heparin Dosing Weight: 64.1 kg  Vital Signs: Temp: 99.1 F (37.3 C) (12/15 2133) Temp Source: Oral (12/15 2133) BP: 136/86 (12/15 1349) Pulse Rate: 72 (12/15 1349)  Labs: Recent Labs    06/21/23 1351 06/21/23 2304  HGB 10.6*  --   HCT 30.7*  --   PLT 234  --   APTT  --  35  LABPROT  --  14.8  INR  --  1.1  CREATININE 1.29* 1.14*    Estimated Creatinine Clearance: 48.5 mL/min (A) (by C-G formula based on SCr of 1.14 mg/dL (H)).   Medical History: Past Medical History:  Diagnosis Date   Absence of kidney    left   Anxiety    Arthritis    Atherosclerosis of native arteries of extremities with intermittent claudication, bilateral legs (HCC)    Bladder cancer (HCC)    CHF (congestive heart failure) (HCC)    Complication of anesthesia    BP HAS  RUN LOW AFTER SURGERY-LUNGS FILLED UP WITH FLUID AFTER  LEG STENT SURGERY    Coronary artery disease    Diabetes mellitus    Family history of adverse reaction to anesthesia    Sister - PONV   GERD (gastroesophageal reflux disease)    OCC TUMS   Heart murmur    Hemorrhoid    History of methicillin resistant staphylococcus aureus (MRSA) 2007   Hypertension    Neuropathy    PVD (peripheral vascular disease) (HCC)    Thyroid nodule    right   Unspecified osteoarthritis, unspecified site    Urothelial carcinoma of kidney (HCC) 10/31/2014   INVASIVE UROTHELIAL CARCINOMA, LOW GRADE. T1, Nx.   Vitamin D deficiency, unspecified    Wears dentures    full upper and lower    Medications:  PTA Meds: Eliquis 5 mg BID, last dose unknown  Assessment: Pt is a 62 yo female presenting to ED c/o left leg pain with hx of  PVD on Eliquis, being transitioned to heparin in case pt needs procedure.  Goal of Therapy:   Heparin level 0.3-0.7 units/ml aPTT 66-102 seconds Monitor platelets by anticoagulation protocol: Yes   Plan:  Given pt's arrival time to ED at 1347, will bolus 4000 units x 1 Start heparin infusion at 1050 units/hr Will follow aPTT until correlation w/ HL confirmed Will check aPTT in 6 hr after start of infusion HL & CBC daily while on heparin  Otelia Sergeant, PharmD, [facility] 06/22/2023 12:32 AM

## 2023-06-22 NOTE — Consult Note (Signed)
NAME: Laura Reed  DOB: 01/05/1961  MRN: [id number]  Date/Time: 06/22/2023 6:15 PM  REQUESTING PROVIDER: Janine Limbo Subjective:  REASON FOR CONSULT: left groin abscess ? Laura Reed is a 62 y.o. with a history of DM, CAD s/p CABG, Bladder ca, S/p left robotic nephroureterectomy  in 2016, BCG Tx, PAD, left common femoral artery to tibioperoneal trunk artery bypass  with PTFE graft, redo surgery for exposure of left femoral artery after multiple previous surgeries , vac placement on 10/234/24 TMA left foot 05/05/23.presents to ED on 06/21/23 with left leg swelling and pain in the groin area  06/21/23 13:49  BP 136/86  Temp 98 F (36.7 C)  Pulse Rate 72  Resp 19  SpO2 96 %     Latest Reference Range & Units 06/21/23 13:51  WBC 4.0 - 10.5 K/uL 18.8 (H)  Hemoglobin 12.0 - 15.0 g/dL 40.9 (L)  HCT 81.1 - 91.4 % 30.7 (L)  Platelets 150 - 400 K/uL 234  Creatinine 0.44 - 1.00 mg/dL 7.82 (H)   [place] left lower extremity shows a 10.6 cm complex collection over the common femoral artery on the left without any pseudoaneurysm, or active extravasation or fistula I am seeing her for same Pt says she had diarrhea for a few days but today has had no diarrhea Past Medical History:  Diagnosis Date   Absence of kidney    left   Anxiety    Arthritis    Atherosclerosis of native arteries of extremities with intermittent claudication, bilateral legs (HCC)    Bladder cancer (HCC)    CHF (congestive heart failure) (HCC)    Complication of anesthesia    BP HAS  RUN LOW AFTER SURGERY-LUNGS FILLED UP WITH FLUID AFTER  LEG STENT SURGERY    Coronary artery disease    Diabetes mellitus    Family history of adverse reaction to anesthesia    Sister - PONV   GERD (gastroesophageal reflux disease)    OCC TUMS   Heart murmur    Hemorrhoid    History of methicillin resistant staphylococcus aureus (MRSA) 2007   Hypertension    Neuropathy    PVD (peripheral vascular disease) (HCC)    Thyroid nodule     right   Unspecified osteoarthritis, unspecified site    Urothelial carcinoma of kidney (HCC) 10/31/2014   INVASIVE UROTHELIAL CARCINOMA, LOW GRADE. T1, Nx.   Vitamin D deficiency, unspecified    Wears dentures    full upper and lower    Past Surgical History:  Procedure Laterality Date   AMPUTATION TOE     right (4th and 5th); left (great toe, 3rd)   AMPUTATION TOE Right 07/16/2018   Procedure: AMPUTATION TOE/MPJ right 2nd;  Surgeon: Linus Galas, DPM;  Location: [facility];  Service: Podiatry;  Laterality: Right;   ARTERIAL BYPASS SURGRY  2009, 2013 x 2   right leg , done in [place]   CARDIAC CATHETERIZATION     CAROTID ENDARTERECTOMY Right 01/2014   Dr Gilda Crease   CATARACT EXTRACTION W/PHACO Right 12/14/2014   Procedure: CATARACT EXTRACTION PHACO AND INTRAOCULAR LENS PLACEMENT (IOC);  Surgeon: Lia Hopping, MD;  Location: [facility];  Service: Ophthalmology;  Laterality: Right;  [place]   00:38.6              AP        7.1                   CDE  2.76   CATARACT EXTRACTION W/PHACO Left  12/06/2019   Procedure: CATARACT EXTRACTION PHACO AND INTRAOCULAR LENS PLACEMENT (IOC) LEFT DIABETIC;  Surgeon: Galen Manila, MD;  Location: [facility];  Service: Ophthalmology;  Laterality: Left;  9.08 1:06.4   CESAREAN SECTION     CHOLECYSTECTOMY  03-03-12   Porcelain gallbladder, gallstones,  [place]   COLONOSCOPY W/ BIOPSIES  04/28/2012   Hyperplastic rectal polyps.   COLONOSCOPY WITH PROPOFOL N/A 04/02/2022   Procedure: COLONOSCOPY WITH PROPOFOL;  Surgeon: Earline Mayotte, MD;  Location: [facility];  Service: Endoscopy;  Laterality: N/A;   CORONARY ARTERY BYPASS GRAFT  2009   3 vessel   CYSTOSCOPY W/ RETROGRADES Right 09/01/2016   Procedure: CYSTOSCOPY WITH RETROGRADE PYELOGRAM;  Surgeon: Vanna Scotland, MD;  Location: [facility];  Service: Urology;  Laterality: Right;   CYSTOSCOPY W/ RETROGRADES Bilateral 03/19/2020   Procedure: CYSTOSCOPY WITH RETROGRADE PYELOGRAM;  Surgeon: Vanna Scotland, MD;  Location: [facility];  Service: Urology;  Laterality: Bilateral;   CYSTOSCOPY WITH BIOPSY N/A 03/19/2020   Procedure: CYSTOSCOPY WITH BIOPSY;  Surgeon: Vanna Scotland, MD;  Location: [facility];  Service: Urology;  Laterality: N/A;   EYE SURGERY     FEMORAL-POPLITEAL BYPASS GRAFT Left 04/29/2023   Procedure: BYPASS GRAFT FEMORAL-POPLITEAL ARTERY;  Surgeon: Renford Dills, MD;  Location: [facility];  Service: Vascular;  Laterality: Left;   HERNIA REPAIR  10-31-14   ventral, retro-rectus atrium mesh   IRRIGATION AND DEBRIDEMENT FOOT Left 01/18/2019   Procedure: IRRIGATION AND DEBRIDEMENT FOOT;  Surgeon: Linus Galas, DPM;  Location: [facility];  Service: Podiatry;  Laterality: Left;   LOWER EXTREMITY ANGIOGRAPHY Left 12/10/2016   Procedure: Lower Extremity Angiography;  Surgeon: Renford Dills, MD;  Location: [facility];  Service: Cardiovascular;  Laterality: Left;   LOWER EXTREMITY ANGIOGRAPHY Left 02/02/2018   Procedure: LOWER EXTREMITY ANGIOGRAPHY;  Surgeon: Renford Dills, MD;  Location: [facility];  Service: Cardiovascular;  Laterality: Left;   LOWER EXTREMITY ANGIOGRAPHY Left 05/05/2018   Procedure: LOWER EXTREMITY ANGIOGRAPHY;  Surgeon: Renford Dills, MD;  Location: [facility];  Service: Cardiovascular;  Laterality: Left;   LOWER EXTREMITY ANGIOGRAPHY Left 12/04/2020   Procedure: LOWER EXTREMITY ANGIOGRAPHY with Intervention;  Surgeon: Renford Dills, MD;  Location: [facility];  Service: Cardiovascular;  Laterality: Left;   LOWER EXTREMITY ANGIOGRAPHY Left 04/24/2023   Procedure: Lower Extremity Angiography;  Surgeon: Renford Dills, MD;  Location: [facility];  Service: Cardiovascular;  Laterality: Left;   LOWER EXTREMITY ANGIOGRAPHY Left 05/01/2023   Procedure: Lower Extremity Angiography;  Surgeon: Renford Dills, MD;  Location: [facility];  Service: Cardiovascular;  Laterality: Left;   NEPHRECTOMY  Left 10-31-14   PERIPHERAL VASCULAR CATHETERIZATION Left 05/01/2015   Procedure: Lower Extremity Angiography;  Surgeon: Renford Dills, MD;  Location: [facility];  Service: Cardiovascular;  Laterality: Left;   PERIPHERAL VASCULAR CATHETERIZATION  05/01/2015   Procedure: Lower Extremity Intervention;  Surgeon: Renford Dills, MD;  Location: [facility];  Service: Cardiovascular;;   PERIPHERAL VASCULAR CATHETERIZATION Left 02/20/2015   Procedure: Pelvic Angiography;  Surgeon: Renford Dills, MD;  Location: [facility];  Service: Cardiovascular;  Laterality: Left;   TRANSMETATARSAL AMPUTATION Left 05/05/2023   Procedure: TRANSMETATARSAL AMPUTATION LEFT;  Surgeon: Gwyneth Revels, DPM;  Location: [facility];  Service: Orthopedics/Podiatry;  Laterality: Left;   TRANSURETHRAL RESECTION OF BLADDER TUMOR WITH MITOMYCIN-C N/A 09/01/2016   Procedure: TRANSURETHRAL RESECTION OF BLADDER TUMOR WITH MITOMYCIN-C;  Surgeon: Vanna Scotland, MD;  Location: [facility];  Service: Urology;  Laterality: N/A;    Social History   Socioeconomic History   Marital status: Legally Separated    Spouse name: Not on file   Number of children: 2   Years of education: 12   Highest education level: 12th grade  Occupational History   Occupation: Disabled  Tobacco Use   Smoking status: Former    Current packs/day: 0.00    Average packs/day: 2.0 packs/day for 35.0 years (70.0 ttl pk-yrs)    Types: Cigarettes    Start date: 03/29/1978    Quit date: 03/29/2013    Years since quitting: 10.2   Smokeless tobacco: Never  Vaping Use   Vaping status: Former  Substance and Sexual Activity   Alcohol use: Not Currently    Alcohol/week: 0.0 standard drinks of alcohol    Comment: LAST DRINK 2009   Drug use: Not Currently    Types: Cocaine    Comment: last used in 2007   Sexual activity: Not Currently  Other Topics Concern   Not on file  Social History Narrative   Not on file   Social Drivers  of Health   Financial Resource Strain: Low Risk  (06/21/2023)   Received from [facility]   Overall Financial Resource Strain (CARDIA)    Difficulty of Paying Living Expenses: Not hard at all  Food Insecurity: No Food Insecurity (06/21/2023)   Received from [facility]   Hunger Vital Sign    Worried About Running Out of Food in the Last Year: Never true    Ran Out of Food in the Last Year: Never true  Recent Concern: Food Insecurity - Food Insecurity Present (05/26/2023)   Received from [facility]   Hunger Vital Sign    Worried About Running Out of Food in the Last Year: Sometimes true    Ran Out of Food in the Last Year: Sometimes true  Transportation Needs: No Transportation Needs (06/21/2023)   Received from [facility] - Transportation    In the past 12 months, has lack of transportation kept you from medical appointments or from getting medications?: No    Lack of Transportation (Non-Medical): No  Physical Activity: Inactive (06/17/2023)   Exercise Vital Sign    Days of Exercise per Week: 0 days    Minutes of Exercise per Session: 0 min  Stress: Stress Concern Present (06/17/2023)   [employer] of Occupational Health - Occupational Stress Questionnaire    Feeling of Stress : To some extent  Social Connections: Socially Isolated (06/17/2023)   Social Connection and Isolation Panel [NHANES]    Frequency of Communication with Friends and Family: More than three times a week    Frequency of Social Gatherings with Friends and Family: More than three times a week    Attends Religious Services: Never    Database administrator or Organizations: No    Attends Banker Meetings: Never    Marital Status: Widowed  Intimate Partner Violence: Not At Risk (06/17/2023)   Humiliation, Afraid, Rape, and Kick questionnaire    Fear of Current or Ex-Partner: No    Emotionally Abused: No     Physically Abused: No    Sexually Abused: No    Family History  Problem Relation Age of Onset   Cancer Mother 14       Lung Cancer   Cancer Father 38  Lung Ca   Diabetes Son    Breast cancer Maternal Grandmother    Kidney cancer Neg Hx    Bladder Cancer Neg Hx    Prostate cancer Neg Hx    No Known Allergies I? Current Facility-Administered Medications  Medication Dose Route Frequency Provider Last Rate Last Admin   0.9 %  sodium chloride infusion   Intravenous Continuous Lorretta Harp, MD 75 mL/hr at 06/22/23 0121 New Bag at 06/22/23 0121   0.9 %  sodium chloride infusion  250 mL Intravenous Continuous Jimmye Norman, NP   Paused at 06/22/23 0747   acetaminophen (TYLENOL) tablet 650 mg  650 mg Oral Q6H PRN Lorretta Harp, MD       ALPRAZolam Prudy Feeler) tablet 0.5 mg  0.5 mg Oral BID PRN Lorretta Harp, MD       aspirin chewable tablet 81 mg  81 mg Oral QHS Lorretta Harp, MD   81 mg at 06/22/23 0246   atorvastatin (LIPITOR) tablet 20 mg  20 mg Oral Daily Lorretta Harp, MD   20 mg at 06/22/23 2130   cholecalciferol (VITAMIN D3) 25 MCG (1000 UNIT) tablet 2,000 Units  2,000 Units Oral Daily Lorretta Harp, MD   2,000 Units at 06/22/23 8657   gabapentin (NEURONTIN) capsule 300 mg  300 mg Oral BID Lorretta Harp, MD   300 mg at 06/22/23 8469   hydrALAZINE (APRESOLINE) injection 5 mg  5 mg Intravenous Q2H PRN Lorretta Harp, MD       insulin aspart (novoLOG) injection 0-5 Units  0-5 Units Subcutaneous QHS Lorretta Harp, MD   2 Units at 06/21/23 2217   insulin aspart (novoLOG) injection 0-9 Units  0-9 Units Subcutaneous TID WC Lorretta Harp, MD   3 Units at 06/22/23 0752   insulin glargine-yfgn (SEMGLEE) injection 14 Units  14 Units Subcutaneous QHS Lorretta Harp, MD   14 Units at 06/21/23 2256   midodrine (PROAMATINE) tablet 10 mg  10 mg Oral TID WC Lorretta Harp, MD   10 mg at 06/22/23 1658   multivitamin with minerals tablet 1 tablet  1 tablet Oral Daily Lorretta Harp, MD       norepinephrine (LEVOPHED) 4mg  in  (0.016 mg/mL) premix infusion  2-10 mcg/min Intravenous Titrated Jimmye Norman, NP 7.5 mL/hr at 06/22/23 1153 2 mcg/min at 06/22/23 1153   ondansetron (ZOFRAN) injection 4 mg  4 mg Intravenous Q8H PRN Lorretta Harp, MD       oxyCODONE-acetaminophen (PERCOCET/ROXICET) 5-325 MG per tablet 1 tablet  1 tablet Oral Q4H PRN Lorretta Harp, MD       piperacillin-tazobactam (ZOSYN) IVPB 3.375 g  3.375 g Intravenous Q8H Otelia Sergeant, RPH 12.5 mL/hr at 06/22/23 1407 3.375 g at 06/22/23 1407   sodium chloride tablet 1 g  1 g Oral BID WC Lorretta Harp, MD   1 g at 06/22/23 1657   [START ON 06/23/2023] vancomycin (VANCOCIN) IVPB 1000 mg/200 mL premix  1,000 mg Intravenous Q24H Otelia Sergeant, [facility]       Current Outpatient Medications  Medication Sig Dispense Refill   acetaminophen (TYLENOL) 500 MG tablet Take 1,000 mg by mouth every 6 (six) hours as needed for mild pain or headache.      apixaban (ELIQUIS) 2.5 MG TABS tablet Take 1 tablet (2.5 mg total) by mouth 2 (two) times daily. 60 tablet 11   aspirin 81 MG chewable tablet Chew 81 mg by mouth at bedtime.      Cholecalciferol (VITAMIN D3) 2000 UNITS TABS  Take 2,000 Units by mouth daily after supper.      clopidogrel (PLAVIX) 75 MG tablet Take 1 tablet (75 mg total) by mouth daily at 6 (six) AM. 30 tablet 11   empagliflozin (JARDIANCE) 25 MG TABS tablet TAKE 1 TABLET(25 MG) BY MOUTH DAILY BEFORE AND BREAKFAST 90 tablet 2   gabapentin (NEURONTIN) 300 MG capsule Take 300 mg by mouth 2 (two) times daily.     insulin degludec (TRESIBA FLEXTOUCH) 100 UNIT/ML FlexTouch Pen Inject 20 Units into the skin daily. (Patient taking differently: Inject 22 Units into the skin daily.) 15 mL 2   insulin lispro (HUMALOG KWIKPEN) 100 UNIT/ML KwikPen ADMINISTER 10 UNITS UNDER THE SKIN THREE TIMES DAILY BEFORE MEALS (Patient taking differently: ADMINISTER 10 UNITS UNDER THE SKIN THREE TIMES DAILY BEFORE MEALS PRN) 15 mL 0   losartan (COZAAR) 50 MG tablet Take 50 mg by mouth  daily.     Multiple Vitamin (MULTIVITAMIN) tablet Take 1 tablet by mouth daily.     triamcinolone ointment (KENALOG) 0.5 % Apply 1 Application topically 2 (two) times daily. 80 g 0   ALPRAZolam (XANAX) 0.5 MG tablet Take 1 tablet (0.5 mg total) by mouth 2 (two) times daily as needed for anxiety. (Patient not taking: Reported on 06/21/2023) 30 tablet 1   atorvastatin (LIPITOR) 20 MG tablet TAKE 1 TABLET(20 MG) BY MOUTH DAILY 90 tablet 3   Continuous Glucose Sensor (FREESTYLE LIBRE 2 SENSOR) MISC Use to check sugar at least 4 times daily 2 each 2   glucose blood test strip Use you check blood sugars up to four times daily. 100 each 12   Insulin Pen Needle (PEN NEEDLES) 32G X 4 MM MISC Use to take insulin daily 100 each 3   multivitamin-lutein (OCUVITE-LUTEIN) CAPS capsule Take 1 capsule by mouth daily. (Patient not taking: Reported on 06/21/2023)       Abtx:  Anti-infectives (From admission, onward)    Start     Dose/Rate Route Frequency Ordered Stop   06/23/23 1200  vancomycin (VANCOCIN) IVPB 1000 mg/200 mL premix       Placed in "Followed by" Linked Group   1,000 mg 200 mL/hr over 60 Minutes Intravenous Every 24 hours 06/22/23 0042     06/22/23 0200  piperacillin-tazobactam (ZOSYN) IVPB 3.375 g        3.375 g 12.5 mL/hr over 240 Minutes Intravenous Every 8 hours 06/22/23 0042     06/22/23 0045  vancomycin (VANCOCIN) IVPB 1000 mg/200 mL premix       Placed in "Followed by" Linked Group   1,000 mg 200 mL/hr over 60 Minutes Intravenous STAT 06/22/23 0042 06/22/23 0254       REVIEW OF SYSTEMS:  Const: negative fever, negative chills, negative weight loss Eyes: negative diplopia or visual changes, negative eye pain ENT: negative coryza, negative sore throat Resp: negative cough, hemoptysis, dyspnea Cards: negative for chest pain, palpitations, lower extremity edema GU: negative for frequency, dysuria and hematuria GI: Negative for abdominal pain, diarrhea, bleeding,  constipation Skin: negative for rash and pruritus Heme: negative for easy bruising and gum/nose bleeding IO:NGEX groin pain and swelling Neurolo:negative for headaches, dizziness, vertigo, memory problems  Psych: negative for feelings of anxiety, depression  Endocrine: negative for thyroid, diabetes Allergy/Immunology- negative for any medication or food allergies ?  Objective:  VITALS:  BP (!) 85/38   Pulse 74   Temp 99 F (37.2 C)   Resp 16   Ht 5\' 7"  (1.702 m)   Wt 64.1  kg   SpO2 95%   BMI 22.15 kg/m   PHYSICAL EXAM:  General: Alert, cooperative, some distress, appears stated age.  Head: Normocephalic, without obvious abnormality, atraumatic. Eyes: Conjunctivae clear, anicteric sclerae. Pupils are equal ENT Nares normal. No drainage or sinus tenderness. Lips, mucosa, and tongue normal. No Thrush Neck: Supple, symmetrical, no adenopathy, thyroid: non tender no carotid bruit and no JVD. Back: No CVA tenderness. Lungs: Clear to auscultation bilaterally. No Wheezing or Rhonchi. No rales. Heart: Regular rate and rhythm, no murmur, rub or gallop. Abdomen: Soft, non-tender,not distended. Bowel sounds normal. No masses Extremities: left groin fluctuant mass at the site of the previous surgical site Left TMA- healed well Rt foot- many toes have been amputated Skin: No rashes or lesions. Or bruising Lymph: Cervical, supraclavicular normal. Neurologic: Grossly non-focal Pertinent Labs Lab Results CBC    Component Value Date/Time   WBC 14.9 (H) 06/22/2023 0458   RBC 3.27 (L) 06/22/2023 0458   HGB 9.8 (L) 06/22/2023 0458   HGB 9.5 (L) 11/02/2014 0609   HCT 29.2 (L) 06/22/2023 0458   HCT 29.5 (L) 11/02/2014 0609   PLT 238 06/22/2023 0458   PLT 217 11/02/2014 0609   MCV 89.3 06/22/2023 0458   MCV 87 11/02/2014 0609   MCH 30.0 06/22/2023 0458   MCHC 33.6 06/22/2023 0458   RDW 12.9 06/22/2023 0458   RDW 13.6 11/02/2014 0609   LYMPHSABS 1.3 09/08/2022 1044   LYMPHSABS  1.2 11/02/2014 0609   MONOABS 0.7 09/08/2022 1044   MONOABS 1.1 (H) 11/02/2014 0609   EOSABS 0.1 09/08/2022 1044   EOSABS 0.3 11/02/2014 0609   BASOSABS 0.1 09/08/2022 1044   BASOSABS 0.0 11/02/2014 0609       Latest Ref Rng & Units 06/22/2023    4:58 AM 06/21/2023   11:04 PM 06/21/2023    1:51 PM  CMP  Glucose 70 - 99 mg/dL 295  284  132   BUN 8 - 23 mg/dL 37  39  39   Creatinine 0.44 - 1.00 mg/dL 4.40  1.02  7.25   Sodium 135 - 145 mmol/L 126  128  124   Potassium 3.5 - 5.1 mmol/L 4.2  4.2  4.2   Chloride 98 - 111 mmol/L 99  98  93   CO2 22 - 32 mmol/L 19  25  24    Calcium 8.9 - 10.3 mg/dL 7.5  7.7  8.0   Total Protein 6.5 - 8.1 g/dL 5.4     Total Bilirubin <1.2 mg/dL 0.6     Alkaline Phos 38 - 126 U/L 105     AST 15 - 41 U/L 24     ALT 0 - 44 U/L 16         Microbiology: Recent Results (from the past 240 hours)  Blood culture (routine x 2)     Status: None (Preliminary result)   Collection Time: 06/21/23  3:34 PM   Specimen: BLOOD  Result Value Ref Range Status   Specimen Description BLOOD LEFT ANTECUBITAL  Final   Special Requests   Final    BOTTLES DRAWN AEROBIC AND ANAEROBIC Blood Culture results may not be optimal due to an inadequate volume of blood received in culture bottles   Culture   Final    NO GROWTH < 24 HOURS Performed at [facility], [address] [place], [place] [address]    Report Status PENDING  Incomplete    IMAGING RESULTS: 8 cm collection over the left groin  I have personally reviewed the films ? Impression/Recommendation PAD s/p left common femoral artery to tibioperoneal trunk artery bypass  with PTFE graft in OCT 2024 Now has a fluctuant painful left groin mass- CT shws 10 cm complex collection Need to r/o abscess VS infected hematoma Pt is going for vascular surgery on Wed Currently on vanco and zosyn, will change zosyn to cefepime  Left TMA- healed well  CAD s/p CABG  H/o bladder ca s/p left  nephrectomy Solitary kidney  AKI- monitor closely as on vanco Change zosyn to cefepime  Discussed the management with patient and care team ?  ________________________________________________ Discussed with patient, requesting provider Note:  This document was prepared using Dragon voice recognition software and may include unintentional dictation errors.

## 2023-06-22 NOTE — Progress Notes (Signed)
Pharmacy Antibiotic Note  Laura Reed is a 62 y.o. female admitted on 06/21/2023 with WBC 18.8 and procalcitonin 18.8, no clear source of infection identified. Marland Kitchen  Pharmacy has been consulted for Vancomycin & Zosyn dosing.  Plan: Zosyn 3.375g IV q8h (4 hour infusion).  Vancomycin 1000 mg IV Q 24 hrs. Goal AUC 400-550. Expected AUC: 485.4 SCr used: 1.14  Pharmacy will continue to follow and will adjust abx dosing whenever warranted.  Temp (24hrs), Avg:98.6 F (37 C), Min:98 F (36.7 C), Max:99.1 F (37.3 C)   Recent Labs  Lab 06/21/23 1351 06/21/23 1635 06/21/23 2304  WBC 18.8*  --   --   CREATININE 1.29*  --  1.14*  LATICACIDVEN  --  1.1  --     Estimated Creatinine Clearance: 48.5 mL/min (A) (by C-G formula based on SCr of 1.14 mg/dL (H)).    No Known Allergies  Antimicrobials this admission: 12/16 Zosyn >>  12/16 Vancomycin >>   Microbiology results: 12/15 BCx: Pending  Thank you for allowing pharmacy to be a part of this patient's care.  Otelia Sergeant, PharmD, [facility] 06/22/2023 12:43 AM

## 2023-06-22 NOTE — TOC Initial Note (Signed)
Transition of Care [facility]) - Initial/Assessment Note    Patient Details  Name: Laura Reed MRN: [id number] Date of Birth: September 28, 1960  Transition of Care [facility]) CM/SW Contact:    Marquita Palms, LCSW Phone Number: 06/22/2023, 11:02 AM  Clinical Narrative:                  Health Risk Assessment completed by CSW. CSW met with patient and her sister bedside. Patient reports she lives alone but her sister lives less than a mile from her. She reports that she has a PCP appointment coming soon and she has been in rehabilitation for her leg for 3 months. Patient reports she uses [facility] for her pharmacy needs. Patient has not other needs at this time.       Patient Goals and CMS Choice            Expected Discharge Plan and Services                                              Prior Living Arrangements/Services                       Activities of Daily Living      Permission Sought/Granted                  Emotional Assessment              Admission diagnosis:  Left leg pain [M79.605] Patient Active Problem List   Diagnosis Date Noted   Left leg pain 06/21/2023   Type II diabetes mellitus with peripheral circulatory disorder (HCC) 06/21/2023   AKI (acute kidney injury) (HCC) 06/21/2023   Chronic diastolic CHF (congestive heart failure) (HCC) 06/21/2023   Hyponatremia 06/21/2023   Diarrhea 06/21/2023   Leukocytosis 06/21/2023   Hypophosphatemia 06/21/2023   Normocytic anemia 06/21/2023   Atherosclerosis of artery of extremity with rest pain (HCC) 04/24/2023   Tubular adenoma of colon 04/03/2022   Positive colorectal cancer screening using Cologuard test 12/10/2021   Effusion of right knee 10/16/2021   Acute pain of right knee 10/16/2021   Ventricular bigeminy 08/02/2021   History of amputation of toe (HCC) 06/04/2021   Anxiety state 04/01/2017   Atherosclerosis of native arteries of extremity with intermittent claudication  (HCC) 01/18/2017   Carotid stenosis 12/04/2016   Insomnia secondary to anxiety 02/21/2016   Thyroid nodule 02/21/2016   Hospital discharge follow-up 11/11/2015   Encounter for preventive health examination 08/02/2015   S/p nephrectomy 05/15/2015   Diabetic nephropathy with proteinuria (HCC) 01/27/2015   Overweight (BMI 25.0-29.9) 10/07/2014   History of renal cell carcinoma 09/28/2014   CAD (coronary artery disease) 08/24/2014   Insulin-requiring or dependent type II diabetes mellitus (HCC) 08/24/2014   Hernia of abdominal cavity 08/10/2014   Hyperlipidemia associated with type 2 diabetes mellitus (HCC) 06/17/2014   S/P carotid endarterectomy 01/22/2014   Cardiac arrhythmia 11/14/2013   History of shingles 05/18/2013   Preoperative evaluation of a medical condition to rule out surgical contraindications (TAR required) 04/06/2013   Diabetic peripheral neuropathy associated with type 2 diabetes mellitus (HCC) 04/06/2013   Chronic hip pain 01/16/2013   Routine general medical examination at a health care facility 06/26/2012   PVD (peripheral vascular disease) (HCC)    History of tobacco abuse 04/29/2011   Controlled type 2 DM with  peripheral circulatory disorder (HCC)    Hypertension    Hemorrhoid    PCP:  Sherlene Shams, MD Pharmacy:   [facility] [phone] [place], [place] - [address] AT [facility] [address] [place] [address] Phone: [phone] Fax: [phone]     Social Drivers of Health (SDOH) Social History: SDOH Screenings   Food Insecurity: No Food Insecurity (06/17/2023)  Recent Concern: Food Insecurity - Food Insecurity Present (05/26/2023)   Received from [facility]  Housing: Low Risk  (06/17/2023)  Transportation Needs: No Transportation Needs (06/17/2023)  Utilities: Not At Risk (06/17/2023)  Alcohol Screen: Low Risk  (06/17/2023)  Depression (PHQ2-9): Low Risk  (06/17/2023)  Financial  Resource Strain: Low Risk  (06/17/2023)  Physical Activity: Inactive (06/17/2023)  Social Connections: Socially Isolated (06/17/2023)  Stress: Stress Concern Present (06/17/2023)  Tobacco Use: Medium Risk (06/21/2023)  Health Literacy: Inadequate Health Literacy (06/17/2023)   SDOH Interventions:     Readmission Risk Interventions    06/22/2023   11:01 AM  Readmission Risk Prevention Plan  Transportation Screening Complete  PCP or Specialist Appt within 3-5 Days Complete  Social Work Consult for Recovery Care Planning/Counseling Complete  Palliative Care Screening Not Applicable  Medication Review Oceanographer) Complete

## 2023-06-22 NOTE — Consult Note (Addendum)
NAME:  Laura Reed, MRN:  [id number], DOB:  05-17-1961, LOS: 1 ADMISSION DATE:  06/21/2023, CONSULTATION DATE:  06/22/23 REFERRING MD: Lorretta Harp , CHIEF COMPLAINT:  Hypotension    HPI  62 y.o female with significant PMH of HTN, HLD, DM, sCHF, CAD, PAD (s/p of left common femoral-distal bypass, thrombectomy, angioplasty and stent placement 04/2023 on aspirin, Plavix and Eliquis), anxiety, bladder cancer (s/p of nephrectomy) who presented to the ED with chief complaints of   Patient presented to [facility] urgent care on 06/21/23 with complaints of right groin pain, redness and oozing post op. Patient has hx of PAD and recently underwent left common femoral-distal bypass, thrombectomy, angioplasty and stent placement 04/2023  and right transmetatarsal amputation. States that she has been having left leg swelling. No weeping. Some pitting. She thinks she has been having fever. She was noted to be hypotensive with SBP in the 50s and was sent to the ED for further evaluation.   ED Course: Initial vital signs showed HR of 72 beats/minute, BP 136/86 mm Hg, the RR 19 breaths/minute, and the oxygen saturation 96 % on RA and a temperature of 98.42F (36.7C). . Pertinent Labs/Diagnostics Findings: Na+/ K+: 124/4.2 Glucose: 2537 BUN/Cr.:39/1.29 Calcium:  AST/ALT: WBC: 18.8K/L, Hgb/Hct: 10.6/30.7   PCT: 12.39 Lactic acid:1.1  BNP: 557.6  Imaging:Left leg arterial Duplex: US DVT Left Medication administered in the ED: Vanc and Zosyn, IVFs bolus Disposition:Admitted to stepdown under TRH service  Past Medical History  HTN, HLD, DM, sCHF, CAD, PAD (s/p of left common femoral-distal bypass, thrombectomy, angioplasty and stent placement 04/2023 on aspirin, Plavix and Eliquis), anxiety, bladder cancer (s/p of nephrectomy)  Significant Hospital Events   12/15: Admitted to [facility] service with sepsis secondary to suspected infected groin site 12/16: Remained hypotensive despite IV fluid resuscitation requiring  vasopressors.  PCCM consulted  Consults:  PCCM Vascular  Procedures:  None  Significant Diagnostic Tests:  12/15: Left leg arterial Duplex: 10.6 cm complex collection presumably hematoma overlying the common femoral vessels, without associated pseudoaneurysm, actives/p extravasation, or AV fistula.  12/15: US DVT Left IMPRESSION: No evidence of DVT within the left lower extremity.  12/16: CTA Chest, abdomen and pelvis> IMPRESSION: 1. 8 cm enhancing fluid collection with air surrounding the left common and superficial femoral artery extending to the skin surface. Findings are worrisome for abscess. 2. Wedge-shaped area of hypodensity within the spleen suspicious for infarct. 3. Left nephrectomy changes. 4. Right nonobstructing nephrolithiasis. 5. Stable left adrenal nodule previously characterized as adenoma. 6. Colonic diverticulosis. 7. Well-circumscribed distal presacral mass measuring 4.0 cm is indeterminate. Findings may represent a complex cyst, but other etiologies are not excluded. 8. Bilateral common and external iliac artery stents are present. Can not confirm flow within the stents. There is flow in the aorta. Fem-fem bypass graft appears grossly patent. 9. Aortic atherosclerosis.  Interim History / Subjective:      Micro Data:  12/15: Blood culture x2> 12/16: MRSA PCR>>   Antimicrobials:  Vancomycin 12/15>> Cefepime 12/15>>  OBJECTIVE  Blood pressure (!) 105/45, pulse 72, temperature 99.1 F (37.3 C), temperature source Oral, resp. rate 13, height 5\' 7"  (1.702 m), weight 64.1 kg, SpO2 99%.       No intake or output data in the 24 hours ending 06/22/23 0313 Filed Weights   06/21/23 1348  Weight: 64.1 kg   Physical Examination  GENERAL: 62 year-old female critically ill patient lying in the bed  EYES: PEERLA. No scleral icterus. Extraocular muscles intact.  HEENT: Head atraumatic, normocephalic. Oropharynx and nasopharynx clear.  NECK:  No JVD,  supple  LUNGS: Normal breath sounds bilaterally.  No use of accessory muscles of respiration.  CARDIOVASCULAR: S1, S2 normal. Murmur present, rubs, or gallops.  ABDOMEN: Soft, NTND EXTREMITIES: Left lower extremity swelling without erythema.  Capillary refill < 3 seconds in all extremities. Pulses palpable distally.transmetatarsal amputation of left foot  NEUROLOGIC: No focal neurological deficit appreciated. Cranial nerves are intact.  SKIN: No obvious rash, lesion, or ulcer. Warm to touch  Labs/imaging that I havepersonally reviewed  (right click and "Reselect all SmartList Selections" daily)     Labs   CBC: Recent Labs  Lab 06/21/23 1351 06/22/23 0055  WBC 18.8* 10.8*  HGB 10.6* 9.4*  HCT 30.7* 27.8*  MCV 86.7 89.7  PLT 234 201    Basic Metabolic Panel: Recent Labs  Lab 06/21/23 1351 06/21/23 2304  NA 124* 128*  K 4.2 4.2  CL 93* 98  CO2 24 25  GLUCOSE 237* 198*  BUN 39* 39*  CREATININE 1.29* 1.14*  CALCIUM 8.0* 7.7*  MG 2.6*  --   PHOS 2.3*  --    GFR: Estimated Creatinine Clearance: 48.5 mL/min (A) (by C-G formula based on SCr of 1.14 mg/dL (H)). Recent Labs  Lab 06/21/23 1351 06/21/23 1635 06/22/23 0055  PROCALCITON 12.39  --   --   WBC 18.8*  --  10.8*  LATICACIDVEN  --  1.1  --     Liver Function Tests: No results for input(s): "AST", "ALT", "ALKPHOS", "BILITOT", "PROT", "ALBUMIN" in the last 168 hours. No results for input(s): "LIPASE", "AMYLASE" in the last 168 hours. No results for input(s): "AMMONIA" in the last 168 hours.  ABG    Component Value Date/Time   PHART 7.36 11/01/2015 1716   PCO2ART 35 11/01/2015 1716   PO2ART 104 11/01/2015 1716   HCO3 19.8 (L) 11/01/2015 1716   ACIDBASEDEF 4.9 (H) 11/01/2015 1716   O2SAT 97.8 11/01/2015 1716     Coagulation Profile: Recent Labs  Lab 06/21/23 2304  INR 1.1    Cardiac Enzymes: No results for input(s): "CKTOTAL", "CKMB", "CKMBINDEX", "TROPONINI" in the last 168  hours.  HbA1C: Hemoglobin A1C  Date/Time Value Ref Range Status  08/16/2013 03:39 PM 8.0 (H) 4.2 - 6.3 % Final    Comment:    The American Diabetes Association recommends that a primary goal of therapy should be <7% and that physicians should reevaluate the treatment regimen in patients with HbA1c values consistently >8%.   07/21/2011 04:20 AM 11.2 (H) 4.2 - 6.3 % Final    Comment:    The American Diabetes Association recommends that a primary goal of therapy should be <7% and that physicians should reevaluate the treatment regimen in patients with HbA1c values consistently >8%.    Hgb A1c MFr Bld  Date/Time Value Ref Range Status  01/27/2023 11:34 AM 8.0 (H) 4.6 - 6.5 % Final    Comment:    Glycemic Control Guidelines for People with Diabetes:Non Diabetic:  <6%Goal of Therapy: <7%Additional Action Suggested:  >8%   09/08/2022 10:44 AM 7.5 (H) 4.6 - 6.5 % Final    Comment:    Glycemic Control Guidelines for People with Diabetes:Non Diabetic:  <6%Goal of Therapy: <7%Additional Action Suggested:  >8%     CBG: Recent Labs  Lab 06/21/23 2132  GLUCAP 201*    Review of Systems:   Review of Systems  Constitutional:  Positive for chills and fever.  HENT: Negative.  Eyes: Negative.   Respiratory: Negative.    Cardiovascular:  Positive for leg swelling.  Gastrointestinal:  Positive for diarrhea.  Genitourinary:  Positive for flank pain, frequency and urgency.  Musculoskeletal:  Positive for joint pain and neck pain.  Skin: Negative.   Psychiatric/Behavioral:  Positive for depression. The patient is nervous/anxious.    Past Medical History  She,  has a past medical history of Absence of kidney, Anxiety, Arthritis, Atherosclerosis of native arteries of extremities with intermittent claudication, bilateral legs (HCC), Bladder cancer (HCC), CHF (congestive heart failure) (HCC), Complication of anesthesia, Coronary artery disease, Diabetes mellitus, Family history of adverse  reaction to anesthesia, GERD (gastroesophageal reflux disease), Heart murmur, Hemorrhoid, History of methicillin resistant staphylococcus aureus (MRSA) (2007), Hypertension, Neuropathy, PVD (peripheral vascular disease) (HCC), Thyroid nodule, Unspecified osteoarthritis, unspecified site, Urothelial carcinoma of kidney (HCC) (10/31/2014), Vitamin D deficiency, unspecified, and Wears dentures.   Surgical History    Past Surgical History:  Procedure Laterality Date   AMPUTATION TOE     right (4th and 5th); left (great toe, 3rd)   AMPUTATION TOE Right 07/16/2018   Procedure: AMPUTATION TOE/MPJ right 2nd;  Surgeon: Linus Galas, DPM;  Location: [facility];  Service: Podiatry;  Laterality: Right;   ARTERIAL BYPASS SURGRY  2009, 2013 x 2   right leg , done in [place]   CARDIAC CATHETERIZATION     CAROTID ENDARTERECTOMY Right 01/2014   Dr Gilda Crease   CATARACT EXTRACTION W/PHACO Right 12/14/2014   Procedure: CATARACT EXTRACTION PHACO AND INTRAOCULAR LENS PLACEMENT (IOC);  Surgeon: Lia Hopping, MD;  Location: [facility];  Service: Ophthalmology;  Laterality: Right;  [place]   00:38.6              AP        7.1                   CDE  2.76   CATARACT EXTRACTION W/PHACO Left 12/06/2019   Procedure: CATARACT EXTRACTION PHACO AND INTRAOCULAR LENS PLACEMENT (IOC) LEFT DIABETIC;  Surgeon: Galen Manila, MD;  Location: [facility];  Service: Ophthalmology;  Laterality: Left;  9.08 1:06.4   CESAREAN SECTION     CHOLECYSTECTOMY  03-03-12   Porcelain gallbladder, gallstones,  [place]   COLONOSCOPY W/ BIOPSIES  04/28/2012   Hyperplastic rectal polyps.   COLONOSCOPY WITH PROPOFOL N/A 04/02/2022   Procedure: COLONOSCOPY WITH PROPOFOL;  Surgeon: Earline Mayotte, MD;  Location: [facility];  Service: Endoscopy;  Laterality: N/A;   CORONARY ARTERY BYPASS GRAFT  2009   3 vessel   CYSTOSCOPY W/ RETROGRADES Right 09/01/2016   Procedure: CYSTOSCOPY WITH RETROGRADE PYELOGRAM;  Surgeon: Vanna Scotland, MD;  Location:  [facility];  Service: Urology;  Laterality: Right;   CYSTOSCOPY W/ RETROGRADES Bilateral 03/19/2020   Procedure: CYSTOSCOPY WITH RETROGRADE PYELOGRAM;  Surgeon: Vanna Scotland, MD;  Location: [facility];  Service: Urology;  Laterality: Bilateral;   CYSTOSCOPY WITH BIOPSY N/A 03/19/2020   Procedure: CYSTOSCOPY WITH BIOPSY;  Surgeon: Vanna Scotland, MD;  Location: [facility];  Service: Urology;  Laterality: N/A;   EYE SURGERY     FEMORAL-POPLITEAL BYPASS GRAFT Left 04/29/2023   Procedure: BYPASS GRAFT FEMORAL-POPLITEAL ARTERY;  Surgeon: Renford Dills, MD;  Location: [facility];  Service: Vascular;  Laterality: Left;   HERNIA REPAIR  10-31-14   ventral, retro-rectus atrium mesh   IRRIGATION AND DEBRIDEMENT FOOT Left 01/18/2019   Procedure: IRRIGATION AND DEBRIDEMENT FOOT;  Surgeon: Linus Galas, DPM;  Location: [facility];  Service: Podiatry;  Laterality: Left;  LOWER EXTREMITY ANGIOGRAPHY Left 12/10/2016   Procedure: Lower Extremity Angiography;  Surgeon: Renford Dills, MD;  Location: [facility];  Service: Cardiovascular;  Laterality: Left;   LOWER EXTREMITY ANGIOGRAPHY Left 02/02/2018   Procedure: LOWER EXTREMITY ANGIOGRAPHY;  Surgeon: Renford Dills, MD;  Location: [facility];  Service: Cardiovascular;  Laterality: Left;   LOWER EXTREMITY ANGIOGRAPHY Left 05/05/2018   Procedure: LOWER EXTREMITY ANGIOGRAPHY;  Surgeon: Renford Dills, MD;  Location: [facility];  Service: Cardiovascular;  Laterality: Left;   LOWER EXTREMITY ANGIOGRAPHY Left 12/04/2020   Procedure: LOWER EXTREMITY ANGIOGRAPHY with Intervention;  Surgeon: Renford Dills, MD;  Location: [facility];  Service: Cardiovascular;  Laterality: Left;   LOWER EXTREMITY ANGIOGRAPHY Left 04/24/2023   Procedure: Lower Extremity Angiography;  Surgeon: Renford Dills, MD;  Location: [facility];  Service: Cardiovascular;  Laterality: Left;   LOWER EXTREMITY ANGIOGRAPHY Left 05/01/2023    Procedure: Lower Extremity Angiography;  Surgeon: Renford Dills, MD;  Location: [facility];  Service: Cardiovascular;  Laterality: Left;   NEPHRECTOMY Left 10-31-14   PERIPHERAL VASCULAR CATHETERIZATION Left 05/01/2015   Procedure: Lower Extremity Angiography;  Surgeon: Renford Dills, MD;  Location: [facility];  Service: Cardiovascular;  Laterality: Left;   PERIPHERAL VASCULAR CATHETERIZATION  05/01/2015   Procedure: Lower Extremity Intervention;  Surgeon: Renford Dills, MD;  Location: [facility];  Service: Cardiovascular;;   PERIPHERAL VASCULAR CATHETERIZATION Left 02/20/2015   Procedure: Pelvic Angiography;  Surgeon: Renford Dills, MD;  Location: [facility];  Service: Cardiovascular;  Laterality: Left;   TRANSMETATARSAL AMPUTATION Left 05/05/2023   Procedure: TRANSMETATARSAL AMPUTATION LEFT;  Surgeon: Gwyneth Revels, DPM;  Location: [facility];  Service: Orthopedics/Podiatry;  Laterality: Left;   TRANSURETHRAL RESECTION OF BLADDER TUMOR WITH MITOMYCIN-C N/A 09/01/2016   Procedure: TRANSURETHRAL RESECTION OF BLADDER TUMOR WITH MITOMYCIN-C;  Surgeon: Vanna Scotland, MD;  Location: [facility];  Service: Urology;  Laterality: N/A;     Social History   reports that she quit smoking about 10 years ago. Her smoking use included cigarettes. She started smoking about 45 years ago. She has a 70 pack-year smoking history. She has never used smokeless tobacco. She reports that she does not currently use alcohol. She reports that she does not currently use drugs after having used the following drugs: Cocaine.   Family History   Her family history includes Breast cancer in her maternal grandmother; Cancer (age of onset: 7) in her mother; Cancer (age of onset: 44) in her father; Diabetes in her son. There is no history of Kidney cancer, Bladder Cancer, or Prostate cancer.   Allergies No Known Allergies   Home Medications  Prior to Admission medications    Medication Sig Start Date End Date Taking? Authorizing Provider  acetaminophen (TYLENOL) 500 MG tablet Take 1,000 mg by mouth every 6 (six) hours as needed for mild pain or headache.    Yes [provider]  apixaban (ELIQUIS) 2.5 MG TABS tablet Take 1 tablet (2.5 mg total) by mouth 2 (two) times daily. 05/11/23  Yes Pace, Huel Cote R, NP  aspirin 81 MG chewable tablet Chew 81 mg by mouth at bedtime.    Yes [provider]  Cholecalciferol (VITAMIN D3) 2000 UNITS TABS Take 2,000 Units by mouth daily after supper.    Yes [provider]  clopidogrel (PLAVIX) 75 MG tablet Take 1 tablet (75 mg total) by mouth daily at 6 (  six) AM. 05/12/23  Yes Pace, Brien R, NP  empagliflozin (JARDIANCE) 25 MG TABS tablet TAKE 1 TABLET(25 MG) BY MOUTH DAILY BEFORE AND BREAKFAST 09/08/22  Yes Sherlene Shams, MD  gabapentin (NEURONTIN) 300 MG capsule Take 300 mg by mouth 2 (two) times daily.   Yes [provider]  insulin degludec (TRESIBA FLEXTOUCH) 100 UNIT/ML FlexTouch Pen Inject 20 Units into the skin daily. Patient taking differently: Inject 22 Units into the skin daily. 09/08/22  Yes Sherlene Shams, MD  insulin lispro (HUMALOG KWIKPEN) 100 UNIT/ML KwikPen ADMINISTER 10 UNITS UNDER THE SKIN THREE TIMES DAILY BEFORE MEALS Patient taking differently: ADMINISTER 10 UNITS UNDER THE SKIN THREE TIMES DAILY BEFORE MEALS PRN 12/28/21  Yes Worthy Rancher B, FNP  losartan (COZAAR) 50 MG tablet Take 50 mg by mouth daily.   Yes [provider]  Multiple Vitamin (MULTIVITAMIN) tablet Take 1 tablet by mouth daily.   Yes [provider]  triamcinolone ointment (KENALOG) 0.5 % Apply 1 Application topically 2 (two) times daily. 01/27/23  Yes Sherlene Shams, MD  ALPRAZolam Prudy Feeler) 0.5 MG tablet Take 1 tablet (0.5 mg total) by mouth 2 (two) times daily as needed for anxiety. Patient not taking: Reported on 06/21/2023 05/11/23   Marcie Bal, NP  atorvastatin (LIPITOR) 20 MG tablet TAKE 1  TABLET(20 MG) BY MOUTH DAILY 01/22/23   Sherlene Shams, MD  Continuous Glucose Sensor (FREESTYLE LIBRE 2 SENSOR) MISC Use to check sugar at least 4 times daily 05/05/23   Sherlene Shams, MD  glucose blood test strip Use you check blood sugars up to four times daily. 03/25/22   Sherlene Shams, MD  Insulin Pen Needle (PEN NEEDLES) 32G X 4 MM MISC Use to take insulin daily 03/31/23   Sherlene Shams, MD  multivitamin-lutein [facility]) CAPS capsule Take 1 capsule by mouth daily. Patient not taking: Reported on 06/21/2023    [provider]  Scheduled Meds:  aspirin  81 mg Oral QHS   atorvastatin  20 mg Oral Daily   cholecalciferol  2,000 Units Oral Daily   gabapentin  300 mg Oral BID   insulin aspart  0-5 Units Subcutaneous QHS   insulin aspart  0-9 Units Subcutaneous TID WC   insulin glargine-yfgn  14 Units Subcutaneous QHS   midodrine  10 mg Oral TID WC   multivitamin with minerals  1 tablet Oral Daily   sodium chloride  1 g Oral BID WC   Continuous Infusions:  sodium chloride 75 mL/hr at 06/22/23 0121   sodium chloride 250 mL (06/22/23 0253)   norepinephrine (LEVOPHED) Adult infusion 2 mcg/min (06/22/23 0248)   piperacillin-tazobactam (ZOSYN)  IV 3.375 g (06/22/23 0252)   potassium PHOSPHATE IVPB (in mmol) 15 mmol (06/21/23 2256)   [START ON 06/23/2023] vancomycin     PRN Meds:.acetaminophen, ALPRAZolam, hydrALAZINE, ondansetron (ZOFRAN) IV, oxyCODONE-acetaminophen   Active Hospital Problem list   See systems below  Assessment & Plan:  #Sepsis with septic shock  #Right groin abscess #?Infectious diarrhea  Patient presenting with left leg pain and diarrhea.  CT abd/pelvis concerning for an abscess surrounding the left common and superficial femoral artery the possible splenic infarct  -F/u cultures, trend lactic/ PCT -GI panel and C. difficile pending -Monitor WBC/ fever curve -Continue Zosyn & vancomycin -IVF hydration as needed -Pressors for MAP goal  >65 -Strict I/O's  #PAD s/p of left common femoral-distal bypass, thrombectomy, angioplasty and stent placement 04/2023  on aspirin, Plavix and Eliquis   -  [place] negative for DVT.  Left leg arterial duplex showed 10.6 cm complex collection concerning for seroma versus hematoma -Follow-up CT abdomen/pelvis concerning for abscess and possible splenic infarct. Discussed with vascular surgeon Dr. Wallace Cullens will evaluate in the morning -Continue aspirin Plavix -Hold Eliquis and continue IV heparin   #Acute Kidney Injury likely ATN #Hyponatremia PMHx: bladder cancer (s/p of nephrectomy  -Monitor I&O's / urinary output -Follow BMP -Check TSH and free T4 -Ensure adequate renal perfusion -Avoid nephrotoxic agents as able -Replace electrolytes as indicated   #Chronic dCHF PMHx: HTN, HLD Echo 12/2021: normal LVEF  -BNP>557.6, no evidence of volume overload -Hold GDMT for for now -Continue atorvastatin, aspirin and Plavix -Obtain 2D echocardiogram  #T2DM -Check hemoglobin A1c -CBG's q4; Target range of 140 to 180 -SSI -Start basal insulin once able to tolerate p.o. -Follow ICU Hypo/Hyperglycemia protocol      Best practice:  Diet:  Oral Pain/Anxiety/Delirium protocol (if indicated): No VAP protocol (if indicated): Not indicated DVT prophylaxis: Systemic AC GI prophylaxis: N/A Glucose control:  SSI Yes Central venous access:  N/A Arterial line:  N/A Foley:  N/A Mobility:  bed rest  PT consulted: N/A Last date of multidisciplinary goals of care discussion [12/16] Code Status:  full code Disposition: ICU   = Goals of Care = Code Status Order: FULL  Primary Emergency Contact: Dent,Sharon, Home Phone: [phone] Wishes to pursue full aggressive treatment and intervention options, including CPR and intubation, but goals of care will be addressed on going with family if that should become necessary.   Critical care time: 45 minutes        Webb Silversmith DNP, CCRN, FNP-C,  AGACNP-BC Acute Care & Family Nurse Practitioner [facility] PCCM on call pager [phone]

## 2023-06-22 NOTE — ED Notes (Signed)
Patient provided with dinner tray.

## 2023-06-22 NOTE — Consult Note (Signed)
MRN : [id number]  Laura Reed is a 62 y.o. (1961-01-03) female who presents with chief complaint of check circulation.  History of Present Illness:   I am asked to evaluate the patient by Dr. Clyde Lundborg.  The patient is a 62 year old woman well-known to my service who has had numerous vascular reconstructions of her lower extremity.  He presented to the emergency room yesterday with increasing pain in the left lower extremity.  Workup including CT scan demonstrated a large fluid collection with numerous gas bubbles present in the left groin.  She was recently admitted to [facility] with an ischemic left foot and severe rest pain.  On April 29, 2023 she underwent a femoral to tibioperoneal trunk bypass using a PTFE distal flow graft.  She was noted to thrombose within the first 48 hours and subsequently underwent angiography with salvage of her bypass and stenting of the anterior tibial artery.  Following the second procedure which was May 01, 2023 she did well and was discharged to home approximately 10 days later.  She was seen back in the office and appeared to be doing just fine with no evidence for infection or hematoma of the left groin.  She notes the pain began a couple days ago.  She denies fever or chills.  She denies any drainage.  She has had some diarrhea but this does not appear to be profound.  I have personally reviewed the CT scan and concur with the above finding.  Current Meds  Medication Sig   acetaminophen (TYLENOL) 500 MG tablet Take 1,000 mg by mouth every 6 (six) hours as needed for mild pain or headache.    apixaban (ELIQUIS) 2.5 MG TABS tablet Take 1 tablet (2.5 mg total) by mouth 2 (two) times daily.   aspirin 81 MG chewable tablet Chew 81 mg by mouth at bedtime.    Cholecalciferol (VITAMIN D3) 2000 UNITS TABS Take 2,000 Units by mouth daily after supper.    clopidogrel (PLAVIX) 75 MG  tablet Take 1 tablet (75 mg total) by mouth daily at 6 (six) AM.   empagliflozin (JARDIANCE) 25 MG TABS tablet TAKE 1 TABLET(25 MG) BY MOUTH DAILY BEFORE AND BREAKFAST   gabapentin (NEURONTIN) 300 MG capsule Take 300 mg by mouth 2 (two) times daily.   insulin degludec (TRESIBA FLEXTOUCH) 100 UNIT/ML FlexTouch Pen Inject 20 Units into the skin daily. (Patient taking differently: Inject 22 Units into the skin daily.)   insulin lispro (HUMALOG KWIKPEN) 100 UNIT/ML KwikPen ADMINISTER 10 UNITS UNDER THE SKIN THREE TIMES DAILY BEFORE MEALS (Patient taking differently: ADMINISTER 10 UNITS UNDER THE SKIN THREE TIMES DAILY BEFORE MEALS PRN)   losartan (COZAAR) 50 MG tablet Take 50 mg by mouth daily.   Multiple Vitamin (MULTIVITAMIN) tablet Take 1 tablet by mouth daily.   triamcinolone ointment (KENALOG) 0.5 % Apply 1 Application topically 2 (two) times daily.    Past Medical History:  Diagnosis Date   Absence of kidney    left   Anxiety    Arthritis    Atherosclerosis of native arteries of extremities with intermittent claudication, bilateral  legs (HCC)    Bladder cancer (HCC)    CHF (congestive heart failure) (HCC)    Complication of anesthesia    BP HAS  RUN LOW AFTER SURGERY-LUNGS FILLED UP WITH FLUID AFTER  LEG STENT SURGERY    Coronary artery disease    Diabetes mellitus    Family history of adverse reaction to anesthesia    Sister - PONV   GERD (gastroesophageal reflux disease)    OCC TUMS   Heart murmur    Hemorrhoid    History of methicillin resistant staphylococcus aureus (MRSA) 2007   Hypertension    Neuropathy    PVD (peripheral vascular disease) (HCC)    Thyroid nodule    right   Unspecified osteoarthritis, unspecified site    Urothelial carcinoma of kidney (HCC) 10/31/2014   INVASIVE UROTHELIAL CARCINOMA, LOW GRADE. T1, Nx.   Vitamin D deficiency, unspecified    Wears dentures    full upper and lower    Past Surgical History:  Procedure Laterality Date   AMPUTATION  TOE     right (4th and 5th); left (great toe, 3rd)   AMPUTATION TOE Right 07/16/2018   Procedure: AMPUTATION TOE/MPJ right 2nd;  Surgeon: Linus Galas, DPM;  Location: [facility];  Service: Podiatry;  Laterality: Right;   ARTERIAL BYPASS SURGRY  2009, 2013 x 2   right leg , done in [place]   CARDIAC CATHETERIZATION     CAROTID ENDARTERECTOMY Right 01/2014   Dr Gilda Crease   CATARACT EXTRACTION W/PHACO Right 12/14/2014   Procedure: CATARACT EXTRACTION PHACO AND INTRAOCULAR LENS PLACEMENT (IOC);  Surgeon: Lia Hopping, MD;  Location: [facility];  Service: Ophthalmology;  Laterality: Right;  [place]   00:38.6              AP        7.1                   CDE  2.76   CATARACT EXTRACTION W/PHACO Left 12/06/2019   Procedure: CATARACT EXTRACTION PHACO AND INTRAOCULAR LENS PLACEMENT (IOC) LEFT DIABETIC;  Surgeon: Galen Manila, MD;  Location: [facility];  Service: Ophthalmology;  Laterality: Left;  9.08 1:06.4   CESAREAN SECTION     CHOLECYSTECTOMY  03-03-12   Porcelain gallbladder, gallstones,  [place]   COLONOSCOPY W/ BIOPSIES  04/28/2012   Hyperplastic rectal polyps.   COLONOSCOPY WITH PROPOFOL N/A 04/02/2022   Procedure: COLONOSCOPY WITH PROPOFOL;  Surgeon: Earline Mayotte, MD;  Location: [facility];  Service: Endoscopy;  Laterality: N/A;   CORONARY ARTERY BYPASS GRAFT  2009   3 vessel   CYSTOSCOPY W/ RETROGRADES Right 09/01/2016   Procedure: CYSTOSCOPY WITH RETROGRADE PYELOGRAM;  Surgeon: Vanna Scotland, MD;  Location: [facility];  Service: Urology;  Laterality: Right;   CYSTOSCOPY W/ RETROGRADES Bilateral 03/19/2020   Procedure: CYSTOSCOPY WITH RETROGRADE PYELOGRAM;  Surgeon: Vanna Scotland, MD;  Location: [facility];  Service: Urology;  Laterality: Bilateral;   CYSTOSCOPY WITH BIOPSY N/A 03/19/2020   Procedure: CYSTOSCOPY WITH BIOPSY;  Surgeon: Vanna Scotland, MD;  Location: [facility];  Service: Urology;  Laterality: N/A;   EYE SURGERY     FEMORAL-POPLITEAL BYPASS GRAFT Left 04/29/2023    Procedure: BYPASS GRAFT FEMORAL-POPLITEAL ARTERY;  Surgeon: Renford Dills, MD;  Location: [facility];  Service: Vascular;  Laterality: Left;   HERNIA REPAIR  10-31-14   ventral, retro-rectus atrium mesh   IRRIGATION AND DEBRIDEMENT FOOT Left 01/18/2019   Procedure: IRRIGATION AND DEBRIDEMENT FOOT;  Surgeon: Linus Galas, DPM;  Location: [facility];  Service: Podiatry;  Laterality: Left;   LOWER EXTREMITY ANGIOGRAPHY Left 12/10/2016   Procedure: Lower Extremity Angiography;  Surgeon: Renford Dills, MD;  Location: [facility];  Service: Cardiovascular;  Laterality: Left;   LOWER EXTREMITY ANGIOGRAPHY Left 02/02/2018   Procedure: LOWER EXTREMITY ANGIOGRAPHY;  Surgeon: Renford Dills, MD;  Location: [facility];  Service: Cardiovascular;  Laterality: Left;   LOWER EXTREMITY ANGIOGRAPHY Left 05/05/2018   Procedure: LOWER EXTREMITY ANGIOGRAPHY;  Surgeon: Renford Dills, MD;  Location: [facility];  Service: Cardiovascular;  Laterality: Left;   LOWER EXTREMITY ANGIOGRAPHY Left 12/04/2020   Procedure: LOWER EXTREMITY ANGIOGRAPHY with Intervention;  Surgeon: Renford Dills, MD;  Location: [facility];  Service: Cardiovascular;  Laterality: Left;   LOWER EXTREMITY ANGIOGRAPHY Left 04/24/2023   Procedure: Lower Extremity Angiography;  Surgeon: Renford Dills, MD;  Location: [facility];  Service: Cardiovascular;  Laterality: Left;   LOWER EXTREMITY ANGIOGRAPHY Left 05/01/2023   Procedure: Lower Extremity Angiography;  Surgeon: Renford Dills, MD;  Location: [facility];  Service: Cardiovascular;  Laterality: Left;   NEPHRECTOMY Left 10-31-14   PERIPHERAL VASCULAR CATHETERIZATION Left 05/01/2015   Procedure: Lower Extremity Angiography;  Surgeon: Renford Dills, MD;  Location: [facility];  Service: Cardiovascular;  Laterality: Left;   PERIPHERAL VASCULAR CATHETERIZATION  05/01/2015   Procedure: Lower Extremity Intervention;   Surgeon: Renford Dills, MD;  Location: [facility];  Service: Cardiovascular;;   PERIPHERAL VASCULAR CATHETERIZATION Left 02/20/2015   Procedure: Pelvic Angiography;  Surgeon: Renford Dills, MD;  Location: [facility];  Service: Cardiovascular;  Laterality: Left;   TRANSMETATARSAL AMPUTATION Left 05/05/2023   Procedure: TRANSMETATARSAL AMPUTATION LEFT;  Surgeon: Gwyneth Revels, DPM;  Location: [facility];  Service: Orthopedics/Podiatry;  Laterality: Left;   TRANSURETHRAL RESECTION OF BLADDER TUMOR WITH MITOMYCIN-C N/A 09/01/2016   Procedure: TRANSURETHRAL RESECTION OF BLADDER TUMOR WITH MITOMYCIN-C;  Surgeon: Vanna Scotland, MD;  Location: [facility];  Service: Urology;  Laterality: N/A;    Social History Social History   Tobacco Use   Smoking status: Former    Current packs/day: 0.00    Average packs/day: 2.0 packs/day for 35.0 years (70.0 ttl pk-yrs)    Types: Cigarettes    Start date: 03/29/1978    Quit date: 03/29/2013    Years since quitting: 10.2   Smokeless tobacco: Never  Vaping Use   Vaping status: Former  Substance Use Topics   Alcohol use: Not Currently    Alcohol/week: 0.0 standard drinks of alcohol    Comment: LAST DRINK 2009   Drug use: Not Currently    Types: Cocaine    Comment: last used in 2007    Family History Family History  Problem Relation Age of Onset   Cancer Mother 66       Lung Cancer   Cancer Father 84       Lung Ca   Diabetes Son    Breast cancer Maternal Grandmother    Kidney cancer Neg Hx    Bladder Cancer Neg Hx    Prostate cancer Neg Hx     No Known Allergies   REVIEW OF SYSTEMS (Negative unless checked)  Constitutional: [] Weight loss  [] Fever  [] Chills Cardiac: [] Chest pain   [] Chest pressure   [] Palpitations   [] Shortness of breath when laying flat   [] Shortness of breath with exertion. Vascular:  [x] Pain in legs with walking   []   Pain in legs at rest  [] History of DVT   [] Phlebitis   [] Swelling in legs    [] Varicose veins   [] Non-healing ulcers Pulmonary:   [] Uses home oxygen   [] Productive cough   [] Hemoptysis   [] Wheeze  [] COPD   [] Asthma Neurologic:  [] Dizziness   [] Seizures   [] History of stroke   [] History of TIA  [] Aphasia   [] Vissual changes   [] Weakness or numbness in arm   [] Weakness or numbness in leg Musculoskeletal:   [] Joint swelling   [] Joint pain   [] Low back pain Hematologic:  [] Easy bruising  [] Easy bleeding   [] Hypercoagulable state   [] Anemic Gastrointestinal:  [] Diarrhea   [] Vomiting  [] Gastroesophageal reflux/heartburn   [] Difficulty swallowing. Genitourinary:  [] Chronic kidney disease   [] Difficult urination  [] Frequent urination   [] Blood in urine Skin:  [] Rashes   [] Ulcers  Psychological:  [] History of anxiety   []  History of major depression.  Physical Examination  Vitals:   06/22/23 1430 06/22/23 1530 06/22/23 1630 06/22/23 1645  BP: 98/85 (!) 102/49 (!) 80/53 (!) 85/38  Pulse: 82 80 80 74  Resp:    16  Temp:      TempSrc:      SpO2: 96% 94% 95% 95%  Weight:      Height:       Body mass index is 22.15 kg/m. Gen: WD/WN, NAD Head: [place]/AT, No temporalis wasting.  Ear/Nose/Throat: Hearing grossly intact, nares w/o erythema or drainage Eyes: PER, EOMI, sclera nonicteric.  Neck: Supple, no masses.  No bruit or JVD.  Pulmonary:  Good air movement, no audible wheezing, no use of accessory muscles.  Cardiac: RRR, normal S1, S2, no Murmurs. Vascular:  mild trophic changes, no open wounds left TMA is well-healed.  Left groin incision is clean dry and intact and just mildly swollen.  There is no erythema or induration of the skin. Vessel Right Left  Radial Palpable Palpable  PT Not Palpable Not Palpable  DP Not Palpable 2+ Palpable  Gastrointestinal: soft, non-distended. No guarding/no peritoneal signs.  Musculoskeletal: M/S 5/5 throughout.  No visible deformity.  Neurologic: CN 2-12 intact. Pain and light touch intact in extremities.  Symmetrical.  Speech is  fluent. Motor exam as listed above. Psychiatric: Judgment intact, Mood & affect appropriate for pt's clinical situation. Dermatologic: No rashes or ulcers noted.  No changes consistent with cellulitis.   CBC Lab Results  Component Value Date   WBC 14.9 (H) 06/22/2023   HGB 9.8 (L) 06/22/2023   HCT 29.2 (L) 06/22/2023   MCV 89.3 06/22/2023   PLT 238 06/22/2023    BMET    Component Value Date/Time   NA 126 (L) 06/22/2023 0458   NA 136 08/02/2021 1629   NA 135 11/02/2014 0609   K 4.2 06/22/2023 0458   K 4.2 11/02/2014 0609   CL 99 06/22/2023 0458   CL 107 11/02/2014 0609   CO2 19 (L) 06/22/2023 0458   CO2 23 11/02/2014 0609   GLUCOSE 258 (H) 06/22/2023 0458   GLUCOSE 108 (H) 11/02/2014 0609   BUN 37 (H) 06/22/2023 0458   BUN 25 08/02/2021 1629   BUN 20 11/02/2014 0609   CREATININE 1.24 (H) 06/22/2023 0458   CREATININE 1.01 11/09/2015 1549   CREATININE 1.01 11/09/2015 1549   CALCIUM 7.5 (L) 06/22/2023 0458   CALCIUM 7.3 (L) 11/02/2014 0609   GFRNONAA 49 (L) 06/22/2023 0458   GFRNONAA 50 (L) 11/02/2014 0609   GFRAA >60 01/19/2019 0357   GFRAA 58 (  L) 11/02/2014 9562   Estimated Creatinine Clearance: 44.6 mL/min (A) (by C-G formula based on SCr of 1.24 mg/dL (H)).  COAG Lab Results  Component Value Date   INR 1.1 06/21/2023   INR 0.9 10/17/2014   INR 1.1 01/19/2014    Radiology CT ABDOMEN PELVIS W CONTRAST Result Date: 06/22/2023 CLINICAL DATA:  Retroperitoneal bleed suspected. EXAM: CT ABDOMEN AND PELVIS WITH CONTRAST TECHNIQUE: Multidetector CT imaging of the abdomen and pelvis was performed using the standard protocol following bolus administration of intravenous contrast. RADIATION DOSE REDUCTION: This exam was performed according to the departmental dose-optimization program which includes automated exposure control, adjustment of the mA and/or kV according to patient size and/or use of iterative reconstruction technique. CONTRAST:  OMNIPAQUE IOHEXOL 300  MG/ML  SOLN COMPARISON:  Abdominal CT 11/12/2015. FINDINGS: Lower chest: There is atelectasis in the lung bases. Hepatobiliary: The liver is enlarged. No focal liver lesions are seen. Gallbladder is surgically absent. No biliary ductal dilatation. Pancreas: Diffuse pancreatic calcifications are present compatible with chronic pancreatitis. There is diffuse pancreatic atrophy. No acute surrounding inflammation. Atrophy has progressed compared to the prior study. Spleen: There is a wedge-shaped area of hypodensity within the spleen suspicious for infarct. This is new from prior. The spleen is nonenlarged. No perisplenic fluid collection. Adrenals/Urinary Tract: There is a left adrenal nodule measuring up to 2.7 cm containing a punctate calcification similar to the prior study in 2017. This is previously characterized as adenoma. The right adrenal gland is within normal limits. Left nephrectomy changes are present. There are punctate calculi throughout the right kidney. There is no right-sided hydronephrosis. Posterior right bladder diverticulum is present. Stomach/Bowel: Stomach is within normal limits. Appendix appears normal. No evidence of bowel wall thickening, distention, or inflammatory changes. There are scattered colonic diverticula. Vascular/Lymphatic: There are extensive vascular calcifications throughout the abdomen and pelvis. Bilateral common and external iliac artery stents are present. Can not confirm flow within the stents. There is flow in the aorta. Fem-fem bypass graft appears grossly patent. There is an enhancing fluid collection with air surrounding the left common and superficial femoral artery extending to the skin surface. This collection measures 7.1 by 7.9 by at least 8 cm. No enlarged lymph nodes are identified. Reproductive: Uterus and bilateral adnexa are unremarkable. Other: Well-circumscribed distal presacral mass identified measuring 4.0 x 4.0 x 2.8 cm and 41 Hounsfield units. No  surrounding inflammation. No underlying cortical erosion. There is no ascites or focal abdominal wall hernia. Musculoskeletal: No acute or significant osseous findings. IMPRESSION: 1. 8 cm enhancing fluid collection with air surrounding the left common and superficial femoral artery extending to the skin surface. Findings are worrisome for abscess. 2. Wedge-shaped area of hypodensity within the spleen suspicious for infarct. 3. Left nephrectomy changes. 4. Right nonobstructing nephrolithiasis. 5. Stable left adrenal nodule previously characterized as adenoma. 6. Colonic diverticulosis. 7. Well-circumscribed distal presacral mass measuring 4.0 cm is indeterminate. Findings may represent a complex cyst, but other etiologies are not excluded. 8. Bilateral common and external iliac artery stents are present. Can not confirm flow within the stents. There is flow in the aorta. Fem-fem bypass graft appears grossly patent. 9. Aortic atherosclerosis. Aortic Atherosclerosis (ICD10-I70.0). Electronically Signed   By: Darliss Cheney M.D.   On: 06/22/2023 02:53   [place] Lower Ext Art Left Ltd Result Date: 06/21/2023 CLINICAL DATA:  Swelling over access site after previous arterial procedure EXAM: LEFT LOWER EXTREMITY ARTERIAL DUPLEX SCAN TECHNIQUE: Gray-scale sonography as well as color Doppler and  duplex ultrasound was performed to evaluate the lower extremity arteries including the common femoral artery. COMPARISON:  None Available. FINDINGS: Left lower Extremity ABI: Not calculated Normal common femoral arterial waveforms and velocities. Fem distal graft is patent. No evidence of inflow (aortoiliac) disease. There is a complex 10.6 x 7.4 x 3.4 cm hypoechoic collection overlying the common femoral vessels extending to the superficial overlying subcutaneous tissues. No suggestion of associated pseudoaneurysm, active extravasation, or AV fistula. IMPRESSION: 10.6 cm complex collection presumably hematoma overlying the common  femoral vessels, without associated pseudoaneurysm, active extravasation, or AV fistula. Electronically Signed   By: Corlis Leak M.D.   On: 06/21/2023 17:49   US Venous Img Lower Unilateral Left (DVT) Result Date: 06/21/2023 CLINICAL DATA:  Left lower extremity edema. Patient is on anticoagulation. History of left fem-fem and fem-pop bypass grafts. Evaluate for DVT. EXAM: LEFT LOWER EXTREMITY VENOUS DOPPLER ULTRASOUND TECHNIQUE: Gray-scale sonography with graded compression, as well as color Doppler and duplex ultrasound were performed to evaluate the lower extremity deep venous systems from the level of the common femoral vein and including the common femoral, femoral, profunda femoral, popliteal and calf veins including the posterior tibial, peroneal and gastrocnemius veins when visible. The superficial great saphenous vein was also interrogated. Spectral Doppler was utilized to evaluate flow at rest and with distal augmentation maneuvers in the common femoral, femoral and popliteal veins. COMPARISON:  None Available. FINDINGS: Contralateral Common Femoral Vein: Respiratory phasicity is normal and symmetric with the symptomatic side. No evidence of thrombus. Normal compressibility. Common Femoral Vein: No evidence of thrombus. Normal compressibility, respiratory phasicity and response to augmentation. Saphenofemoral Junction: No evidence of thrombus. Normal compressibility and flow on color Doppler imaging. Profunda Femoral Vein: No evidence of thrombus. Normal compressibility and flow on color Doppler imaging. Femoral Vein: No evidence of thrombus. Normal compressibility, respiratory phasicity and response to augmentation. Popliteal Vein: No evidence of thrombus. Normal compressibility, respiratory phasicity and response to augmentation. Calf Veins: No evidence of thrombus. Normal compressibility and flow on color Doppler imaging. Superficial Great Saphenous Vein: Not visualized, potentially the sequela of  previous venous harvesting. Other Findings: Fem-fem bypass graft appears patent at the level of the right groin (image 28). IMPRESSION: No evidence of DVT within the left lower extremity. Electronically Signed   By: Simonne Come M.D.   On: 06/21/2023 16:47   VAS [place] ABI WITH/WO TBI Result Date: 05/28/2023  LOWER EXTREMITY DOPPLER STUDY Patient Name:  GURVEEN TIGGS  Date of Exam:   05/28/2023 Medical Rec #: [id number]        Accession #:    [id number] Date of Birth: 05/22/1961        Patient Gender: F Patient Age:   47 years Exam Location:  [facility] Procedure:      VAS [place] ABI WITH/WO TBI Referring Phys: [facility] --------------------------------------------------------------------------------  Indications: Peripheral artery disease. Other Factors: H/O right 4th & 5th and left 1st & 3rd toe amputations                12/03/2020 left pop thrombectomy and new stent.  Vascular Interventions: 05/01/2023 Left CF and AT stent                         04/29/2023 Left fem-pop bypass graft                          H/O left-to-right fem-fem BPG with  lower extremity                         stents & BPGs in [place];                         10/11/09: Left external iliac/common femoral artery PTA;                         07/25/11: Right profunda femoral to TP trunk BPG;                         12/31/11: Revision of fem-fem BPG & redo of anastomosis;                         09/16/12: Fem-fem BPG, left external iliac, left common                         femoral & left profunda femoral artery PTAs;                         06/01/13: Left external iliac to profunda femoral BPG                         with revision of the fem-fem & left fem-AK popliteal BPG                         proximal anastomoses;                         08/02/13: Left distal SFA/popliteal artery PTA;                         02/20/15: Left external iliac artery PTA/stent with left                         common iliac artery PTA;                          12/10/16: Left external iliac & common femoral artery                         PTAs. Performing Technologist: Hardie Lora RVT  Examination Guidelines: A complete evaluation includes at minimum, Doppler waveform signals and systolic blood pressure reading at the level of bilateral brachial, anterior tibial, and posterior tibial arteries, when vessel segments are accessible. Bilateral testing is considered an integral part of a complete examination. Photoelectric Plethysmograph (PPG) waveforms and toe systolic pressure readings are included as required and additional duplex testing as needed. Limited examinations for reoccurring indications may be performed as noted.  ABI Findings: +---------+------------------+-----+----------+--------+ Right    Rt Pressure (mmHg)IndexWaveform  Comment  +---------+------------------+-----+----------+--------+ Brachial 104                                       +---------+------------------+-----+----------+--------+ PTA      112               0.91 biphasic           +---------+------------------+-----+----------+--------+ DP  112               0.91 monophasic         +---------+------------------+-----+----------+--------+ Great Toe83                0.67                    +---------+------------------+-----+----------+--------+ +--------+------------------+-----+----------+-------+ Left    Lt Pressure (mmHg)IndexWaveform  Comment +--------+------------------+-----+----------+-------+ [id number]                                      +--------+------------------+-----+----------+-------+ PTA     121               0.98 monophasic        +--------+------------------+-----+----------+-------+ DP      136               1.11 monophasic        +--------+------------------+-----+----------+-------+ +-------+-----------+-----------+------------+------------+ ABI/TBIToday's ABIToday's TBIPrevious ABIPrevious TBI  +-------+-----------+-----------+------------+------------+ Right  0.91       0.67       0.84        0.50         +-------+-----------+-----------+------------+------------+ Left   1.11       Amputation 0.82        Amputation   +-------+-----------+-----------+------------+------------+ Right ABIs appear essentially unchanged compared to prior study on 12/25/2022. Left ABIs appear increased compared to prior study on 12/25/2022.  Summary: Right: Resting right ankle-brachial index indicates mild right lower extremity arterial disease. The right toe-brachial index is abnormal. Left: Resting left ankle-brachial index is within normal range. *See table(s) above for measurements and observations.  Electronically signed by [name] MD on 05/28/2023 at 5:40:53 PM.    Final      Assessment/Plan 1.  Atherosclerotic occlusive disease bilateral lower extremities with numerous reconstructions now with probable abscess left groin: As noted above I have personally reviewed the CT and concur this does appear likely to be an abscess.  This represents a tremendous problem as both the femoral-femoral bypass and at the recent femoral distal bypass appear to be contiguous with the fluid collection.  My plan will be for incision and drainage of the abscess with a thorough washout of the groin.  The wound will be cultured and a stat Gram stain will be obtained.  Antibiotic beads will be reconstituted and placed along the prosthetic.  A sartorius muscle flap will be rotated over the prosthetic as well.  Once coverage has been achieved a VAC dressing will be applied.  I do not see any way to feasibly reconstruct her lower extremity arterial flow.  Furthermore, given that both the femoral-femoral and the Pham distal grafts are involved both of her lower extremities are at risk.  I discussed this with the patient her sister was in attendance.  The risks and benefits were reviewed.  As noted there really no  alternatives.  Patient agrees to proceed with washout and flap coverage.  Sister is also in agreement.  2.  Diabetes: Continue hypoglycemic medications as already ordered, these medications have been reviewed and there are no changes at this time.  Hgb A1C to be monitored as already arranged by primary service  3.  Coronary artery disease: Continue cardiac and antihypertensive medications as already ordered and reviewed, no changes at this time.  Continue statin as ordered and reviewed, no changes at this time  Nitrates  PRN for chest pain  4.   Primary hypertension: Continue antihypertensive medications as already ordered, these medications have been reviewed and there are no changes at this time.   [name], MD  06/22/2023 6:07 PM

## 2023-06-22 NOTE — H&P (View-Only) (Signed)
MRN : [id number]  Laura Reed is a 62 y.o. (1961-01-03) female who presents with chief complaint of check circulation.  History of Present Illness:   I am asked to evaluate the patient by Dr. Clyde Lundborg.  The patient is a 62 year old woman well-known to my service who has had numerous vascular reconstructions of her lower extremity.  He presented to the emergency room yesterday with increasing pain in the left lower extremity.  Workup including CT scan demonstrated a large fluid collection with numerous gas bubbles present in the left groin.  She was recently admitted to [facility] with an ischemic left foot and severe rest pain.  On April 29, 2023 she underwent a femoral to tibioperoneal trunk bypass using a PTFE distal flow graft.  She was noted to thrombose within the first 48 hours and subsequently underwent angiography with salvage of her bypass and stenting of the anterior tibial artery.  Following the second procedure which was May 01, 2023 she did well and was discharged to home approximately 10 days later.  She was seen back in the office and appeared to be doing just fine with no evidence for infection or hematoma of the left groin.  She notes the pain began a couple days ago.  She denies fever or chills.  She denies any drainage.  She has had some diarrhea but this does not appear to be profound.  I have personally reviewed the CT scan and concur with the above finding.  Current Meds  Medication Sig   acetaminophen (TYLENOL) 500 MG tablet Take 1,000 mg by mouth every 6 (six) hours as needed for mild pain or headache.    apixaban (ELIQUIS) 2.5 MG TABS tablet Take 1 tablet (2.5 mg total) by mouth 2 (two) times daily.   aspirin 81 MG chewable tablet Chew 81 mg by mouth at bedtime.    Cholecalciferol (VITAMIN D3) 2000 UNITS TABS Take 2,000 Units by mouth daily after supper.    clopidogrel (PLAVIX) 75 MG  tablet Take 1 tablet (75 mg total) by mouth daily at 6 (six) AM.   empagliflozin (JARDIANCE) 25 MG TABS tablet TAKE 1 TABLET(25 MG) BY MOUTH DAILY BEFORE AND BREAKFAST   gabapentin (NEURONTIN) 300 MG capsule Take 300 mg by mouth 2 (two) times daily.   insulin degludec (TRESIBA FLEXTOUCH) 100 UNIT/ML FlexTouch Pen Inject 20 Units into the skin daily. (Patient taking differently: Inject 22 Units into the skin daily.)   insulin lispro (HUMALOG KWIKPEN) 100 UNIT/ML KwikPen ADMINISTER 10 UNITS UNDER THE SKIN THREE TIMES DAILY BEFORE MEALS (Patient taking differently: ADMINISTER 10 UNITS UNDER THE SKIN THREE TIMES DAILY BEFORE MEALS PRN)   losartan (COZAAR) 50 MG tablet Take 50 mg by mouth daily.   Multiple Vitamin (MULTIVITAMIN) tablet Take 1 tablet by mouth daily.   triamcinolone ointment (KENALOG) 0.5 % Apply 1 Application topically 2 (two) times daily.    Past Medical History:  Diagnosis Date   Absence of kidney    left   Anxiety    Arthritis    Atherosclerosis of native arteries of extremities with intermittent claudication, bilateral  legs (HCC)    Bladder cancer (HCC)    CHF (congestive heart failure) (HCC)    Complication of anesthesia    BP HAS  RUN LOW AFTER SURGERY-LUNGS FILLED UP WITH FLUID AFTER  LEG STENT SURGERY    Coronary artery disease    Diabetes mellitus    Family history of adverse reaction to anesthesia    Sister - PONV   GERD (gastroesophageal reflux disease)    OCC TUMS   Heart murmur    Hemorrhoid    History of methicillin resistant staphylococcus aureus (MRSA) 2007   Hypertension    Neuropathy    PVD (peripheral vascular disease) (HCC)    Thyroid nodule    right   Unspecified osteoarthritis, unspecified site    Urothelial carcinoma of kidney (HCC) 10/31/2014   INVASIVE UROTHELIAL CARCINOMA, LOW GRADE. T1, Nx.   Vitamin D deficiency, unspecified    Wears dentures    full upper and lower    Past Surgical History:  Procedure Laterality Date   AMPUTATION  TOE     right (4th and 5th); left (great toe, 3rd)   AMPUTATION TOE Right 07/16/2018   Procedure: AMPUTATION TOE/MPJ right 2nd;  Surgeon: Linus Galas, DPM;  Location: [facility];  Service: Podiatry;  Laterality: Right;   ARTERIAL BYPASS SURGRY  2009, 2013 x 2   right leg , done in [place]   CARDIAC CATHETERIZATION     CAROTID ENDARTERECTOMY Right 01/2014   Dr Gilda Crease   CATARACT EXTRACTION W/PHACO Right 12/14/2014   Procedure: CATARACT EXTRACTION PHACO AND INTRAOCULAR LENS PLACEMENT (IOC);  Surgeon: Lia Hopping, MD;  Location: [facility];  Service: Ophthalmology;  Laterality: Right;  [place]   00:38.6              AP        7.1                   CDE  2.76   CATARACT EXTRACTION W/PHACO Left 12/06/2019   Procedure: CATARACT EXTRACTION PHACO AND INTRAOCULAR LENS PLACEMENT (IOC) LEFT DIABETIC;  Surgeon: Galen Manila, MD;  Location: [facility];  Service: Ophthalmology;  Laterality: Left;  9.08 1:06.4   CESAREAN SECTION     CHOLECYSTECTOMY  03-03-12   Porcelain gallbladder, gallstones,  [place]   COLONOSCOPY W/ BIOPSIES  04/28/2012   Hyperplastic rectal polyps.   COLONOSCOPY WITH PROPOFOL N/A 04/02/2022   Procedure: COLONOSCOPY WITH PROPOFOL;  Surgeon: Earline Mayotte, MD;  Location: [facility];  Service: Endoscopy;  Laterality: N/A;   CORONARY ARTERY BYPASS GRAFT  2009   3 vessel   CYSTOSCOPY W/ RETROGRADES Right 09/01/2016   Procedure: CYSTOSCOPY WITH RETROGRADE PYELOGRAM;  Surgeon: Vanna Scotland, MD;  Location: [facility];  Service: Urology;  Laterality: Right;   CYSTOSCOPY W/ RETROGRADES Bilateral 03/19/2020   Procedure: CYSTOSCOPY WITH RETROGRADE PYELOGRAM;  Surgeon: Vanna Scotland, MD;  Location: [facility];  Service: Urology;  Laterality: Bilateral;   CYSTOSCOPY WITH BIOPSY N/A 03/19/2020   Procedure: CYSTOSCOPY WITH BIOPSY;  Surgeon: Vanna Scotland, MD;  Location: [facility];  Service: Urology;  Laterality: N/A;   EYE SURGERY     FEMORAL-POPLITEAL BYPASS GRAFT Left 04/29/2023    Procedure: BYPASS GRAFT FEMORAL-POPLITEAL ARTERY;  Surgeon: Renford Dills, MD;  Location: [facility];  Service: Vascular;  Laterality: Left;   HERNIA REPAIR  10-31-14   ventral, retro-rectus atrium mesh   IRRIGATION AND DEBRIDEMENT FOOT Left 01/18/2019   Procedure: IRRIGATION AND DEBRIDEMENT FOOT;  Surgeon: Linus Galas, DPM;  Location: [facility];  Service: Podiatry;  Laterality: Left;   LOWER EXTREMITY ANGIOGRAPHY Left 12/10/2016   Procedure: Lower Extremity Angiography;  Surgeon: Renford Dills, MD;  Location: [facility];  Service: Cardiovascular;  Laterality: Left;   LOWER EXTREMITY ANGIOGRAPHY Left 02/02/2018   Procedure: LOWER EXTREMITY ANGIOGRAPHY;  Surgeon: Renford Dills, MD;  Location: [facility];  Service: Cardiovascular;  Laterality: Left;   LOWER EXTREMITY ANGIOGRAPHY Left 05/05/2018   Procedure: LOWER EXTREMITY ANGIOGRAPHY;  Surgeon: Renford Dills, MD;  Location: [facility];  Service: Cardiovascular;  Laterality: Left;   LOWER EXTREMITY ANGIOGRAPHY Left 12/04/2020   Procedure: LOWER EXTREMITY ANGIOGRAPHY with Intervention;  Surgeon: Renford Dills, MD;  Location: [facility];  Service: Cardiovascular;  Laterality: Left;   LOWER EXTREMITY ANGIOGRAPHY Left 04/24/2023   Procedure: Lower Extremity Angiography;  Surgeon: Renford Dills, MD;  Location: [facility];  Service: Cardiovascular;  Laterality: Left;   LOWER EXTREMITY ANGIOGRAPHY Left 05/01/2023   Procedure: Lower Extremity Angiography;  Surgeon: Renford Dills, MD;  Location: [facility];  Service: Cardiovascular;  Laterality: Left;   NEPHRECTOMY Left 10-31-14   PERIPHERAL VASCULAR CATHETERIZATION Left 05/01/2015   Procedure: Lower Extremity Angiography;  Surgeon: Renford Dills, MD;  Location: [facility];  Service: Cardiovascular;  Laterality: Left;   PERIPHERAL VASCULAR CATHETERIZATION  05/01/2015   Procedure: Lower Extremity Intervention;   Surgeon: Renford Dills, MD;  Location: [facility];  Service: Cardiovascular;;   PERIPHERAL VASCULAR CATHETERIZATION Left 02/20/2015   Procedure: Pelvic Angiography;  Surgeon: Renford Dills, MD;  Location: [facility];  Service: Cardiovascular;  Laterality: Left;   TRANSMETATARSAL AMPUTATION Left 05/05/2023   Procedure: TRANSMETATARSAL AMPUTATION LEFT;  Surgeon: Gwyneth Revels, DPM;  Location: [facility];  Service: Orthopedics/Podiatry;  Laterality: Left;   TRANSURETHRAL RESECTION OF BLADDER TUMOR WITH MITOMYCIN-C N/A 09/01/2016   Procedure: TRANSURETHRAL RESECTION OF BLADDER TUMOR WITH MITOMYCIN-C;  Surgeon: Vanna Scotland, MD;  Location: [facility];  Service: Urology;  Laterality: N/A;    Social History Social History   Tobacco Use   Smoking status: Former    Current packs/day: 0.00    Average packs/day: 2.0 packs/day for 35.0 years (70.0 ttl pk-yrs)    Types: Cigarettes    Start date: 03/29/1978    Quit date: 03/29/2013    Years since quitting: 10.2   Smokeless tobacco: Never  Vaping Use   Vaping status: Former  Substance Use Topics   Alcohol use: Not Currently    Alcohol/week: 0.0 standard drinks of alcohol    Comment: LAST DRINK 2009   Drug use: Not Currently    Types: Cocaine    Comment: last used in 2007    Family History Family History  Problem Relation Age of Onset   Cancer Mother 66       Lung Cancer   Cancer Father 84       Lung Ca   Diabetes Son    Breast cancer Maternal Grandmother    Kidney cancer Neg Hx    Bladder Cancer Neg Hx    Prostate cancer Neg Hx     No Known Allergies   REVIEW OF SYSTEMS (Negative unless checked)  Constitutional: [] Weight loss  [] Fever  [] Chills Cardiac: [] Chest pain   [] Chest pressure   [] Palpitations   [] Shortness of breath when laying flat   [] Shortness of breath with exertion. Vascular:  [x] Pain in legs with walking   []   Pain in legs at rest  [] History of DVT   [] Phlebitis   [] Swelling in legs    [] Varicose veins   [] Non-healing ulcers Pulmonary:   [] Uses home oxygen   [] Productive cough   [] Hemoptysis   [] Wheeze  [] COPD   [] Asthma Neurologic:  [] Dizziness   [] Seizures   [] History of stroke   [] History of TIA  [] Aphasia   [] Vissual changes   [] Weakness or numbness in arm   [] Weakness or numbness in leg Musculoskeletal:   [] Joint swelling   [] Joint pain   [] Low back pain Hematologic:  [] Easy bruising  [] Easy bleeding   [] Hypercoagulable state   [] Anemic Gastrointestinal:  [] Diarrhea   [] Vomiting  [] Gastroesophageal reflux/heartburn   [] Difficulty swallowing. Genitourinary:  [] Chronic kidney disease   [] Difficult urination  [] Frequent urination   [] Blood in urine Skin:  [] Rashes   [] Ulcers  Psychological:  [] History of anxiety   []  History of major depression.  Physical Examination  Vitals:   06/22/23 1430 06/22/23 1530 06/22/23 1630 06/22/23 1645  BP: 98/85 (!) 102/49 (!) 80/53 (!) 85/38  Pulse: 82 80 80 74  Resp:    16  Temp:      TempSrc:      SpO2: 96% 94% 95% 95%  Weight:      Height:       Body mass index is 22.15 kg/m. Gen: WD/WN, NAD Head: [place]/AT, No temporalis wasting.  Ear/Nose/Throat: Hearing grossly intact, nares w/o erythema or drainage Eyes: PER, EOMI, sclera nonicteric.  Neck: Supple, no masses.  No bruit or JVD.  Pulmonary:  Good air movement, no audible wheezing, no use of accessory muscles.  Cardiac: RRR, normal S1, S2, no Murmurs. Vascular:  mild trophic changes, no open wounds left TMA is well-healed.  Left groin incision is clean dry and intact and just mildly swollen.  There is no erythema or induration of the skin. Vessel Right Left  Radial Palpable Palpable  PT Not Palpable Not Palpable  DP Not Palpable 2+ Palpable  Gastrointestinal: soft, non-distended. No guarding/no peritoneal signs.  Musculoskeletal: M/S 5/5 throughout.  No visible deformity.  Neurologic: CN 2-12 intact. Pain and light touch intact in extremities.  Symmetrical.  Speech is  fluent. Motor exam as listed above. Psychiatric: Judgment intact, Mood & affect appropriate for pt's clinical situation. Dermatologic: No rashes or ulcers noted.  No changes consistent with cellulitis.   CBC Lab Results  Component Value Date   WBC 14.9 (H) 06/22/2023   HGB 9.8 (L) 06/22/2023   HCT 29.2 (L) 06/22/2023   MCV 89.3 06/22/2023   PLT 238 06/22/2023    BMET    Component Value Date/Time   NA 126 (L) 06/22/2023 0458   NA 136 08/02/2021 1629   NA 135 11/02/2014 0609   K 4.2 06/22/2023 0458   K 4.2 11/02/2014 0609   CL 99 06/22/2023 0458   CL 107 11/02/2014 0609   CO2 19 (L) 06/22/2023 0458   CO2 23 11/02/2014 0609   GLUCOSE 258 (H) 06/22/2023 0458   GLUCOSE 108 (H) 11/02/2014 0609   BUN 37 (H) 06/22/2023 0458   BUN 25 08/02/2021 1629   BUN 20 11/02/2014 0609   CREATININE 1.24 (H) 06/22/2023 0458   CREATININE 1.01 11/09/2015 1549   CREATININE 1.01 11/09/2015 1549   CALCIUM 7.5 (L) 06/22/2023 0458   CALCIUM 7.3 (L) 11/02/2014 0609   GFRNONAA 49 (L) 06/22/2023 0458   GFRNONAA 50 (L) 11/02/2014 0609   GFRAA >60 01/19/2019 0357   GFRAA 58 (  L) 11/02/2014 9562   Estimated Creatinine Clearance: 44.6 mL/min (A) (by C-G formula based on SCr of 1.24 mg/dL (H)).  COAG Lab Results  Component Value Date   INR 1.1 06/21/2023   INR 0.9 10/17/2014   INR 1.1 01/19/2014    Radiology CT ABDOMEN PELVIS W CONTRAST Result Date: 06/22/2023 CLINICAL DATA:  Retroperitoneal bleed suspected. EXAM: CT ABDOMEN AND PELVIS WITH CONTRAST TECHNIQUE: Multidetector CT imaging of the abdomen and pelvis was performed using the standard protocol following bolus administration of intravenous contrast. RADIATION DOSE REDUCTION: This exam was performed according to the departmental dose-optimization program which includes automated exposure control, adjustment of the mA and/or kV according to patient size and/or use of iterative reconstruction technique. CONTRAST:  OMNIPAQUE IOHEXOL 300  MG/ML  SOLN COMPARISON:  Abdominal CT 11/12/2015. FINDINGS: Lower chest: There is atelectasis in the lung bases. Hepatobiliary: The liver is enlarged. No focal liver lesions are seen. Gallbladder is surgically absent. No biliary ductal dilatation. Pancreas: Diffuse pancreatic calcifications are present compatible with chronic pancreatitis. There is diffuse pancreatic atrophy. No acute surrounding inflammation. Atrophy has progressed compared to the prior study. Spleen: There is a wedge-shaped area of hypodensity within the spleen suspicious for infarct. This is new from prior. The spleen is nonenlarged. No perisplenic fluid collection. Adrenals/Urinary Tract: There is a left adrenal nodule measuring up to 2.7 cm containing a punctate calcification similar to the prior study in 2017. This is previously characterized as adenoma. The right adrenal gland is within normal limits. Left nephrectomy changes are present. There are punctate calculi throughout the right kidney. There is no right-sided hydronephrosis. Posterior right bladder diverticulum is present. Stomach/Bowel: Stomach is within normal limits. Appendix appears normal. No evidence of bowel wall thickening, distention, or inflammatory changes. There are scattered colonic diverticula. Vascular/Lymphatic: There are extensive vascular calcifications throughout the abdomen and pelvis. Bilateral common and external iliac artery stents are present. Can not confirm flow within the stents. There is flow in the aorta. Fem-fem bypass graft appears grossly patent. There is an enhancing fluid collection with air surrounding the left common and superficial femoral artery extending to the skin surface. This collection measures 7.1 by 7.9 by at least 8 cm. No enlarged lymph nodes are identified. Reproductive: Uterus and bilateral adnexa are unremarkable. Other: Well-circumscribed distal presacral mass identified measuring 4.0 x 4.0 x 2.8 cm and 41 Hounsfield units. No  surrounding inflammation. No underlying cortical erosion. There is no ascites or focal abdominal wall hernia. Musculoskeletal: No acute or significant osseous findings. IMPRESSION: 1. 8 cm enhancing fluid collection with air surrounding the left common and superficial femoral artery extending to the skin surface. Findings are worrisome for abscess. 2. Wedge-shaped area of hypodensity within the spleen suspicious for infarct. 3. Left nephrectomy changes. 4. Right nonobstructing nephrolithiasis. 5. Stable left adrenal nodule previously characterized as adenoma. 6. Colonic diverticulosis. 7. Well-circumscribed distal presacral mass measuring 4.0 cm is indeterminate. Findings may represent a complex cyst, but other etiologies are not excluded. 8. Bilateral common and external iliac artery stents are present. Can not confirm flow within the stents. There is flow in the aorta. Fem-fem bypass graft appears grossly patent. 9. Aortic atherosclerosis. Aortic Atherosclerosis (ICD10-I70.0). Electronically Signed   By: Darliss Cheney M.D.   On: 06/22/2023 02:53   [place] Lower Ext Art Left Ltd Result Date: 06/21/2023 CLINICAL DATA:  Swelling over access site after previous arterial procedure EXAM: LEFT LOWER EXTREMITY ARTERIAL DUPLEX SCAN TECHNIQUE: Gray-scale sonography as well as color Doppler and  duplex ultrasound was performed to evaluate the lower extremity arteries including the common femoral artery. COMPARISON:  None Available. FINDINGS: Left lower Extremity ABI: Not calculated Normal common femoral arterial waveforms and velocities. Fem distal graft is patent. No evidence of inflow (aortoiliac) disease. There is a complex 10.6 x 7.4 x 3.4 cm hypoechoic collection overlying the common femoral vessels extending to the superficial overlying subcutaneous tissues. No suggestion of associated pseudoaneurysm, active extravasation, or AV fistula. IMPRESSION: 10.6 cm complex collection presumably hematoma overlying the common  femoral vessels, without associated pseudoaneurysm, active extravasation, or AV fistula. Electronically Signed   By: Corlis Leak M.D.   On: 06/21/2023 17:49   US Venous Img Lower Unilateral Left (DVT) Result Date: 06/21/2023 CLINICAL DATA:  Left lower extremity edema. Patient is on anticoagulation. History of left fem-fem and fem-pop bypass grafts. Evaluate for DVT. EXAM: LEFT LOWER EXTREMITY VENOUS DOPPLER ULTRASOUND TECHNIQUE: Gray-scale sonography with graded compression, as well as color Doppler and duplex ultrasound were performed to evaluate the lower extremity deep venous systems from the level of the common femoral vein and including the common femoral, femoral, profunda femoral, popliteal and calf veins including the posterior tibial, peroneal and gastrocnemius veins when visible. The superficial great saphenous vein was also interrogated. Spectral Doppler was utilized to evaluate flow at rest and with distal augmentation maneuvers in the common femoral, femoral and popliteal veins. COMPARISON:  None Available. FINDINGS: Contralateral Common Femoral Vein: Respiratory phasicity is normal and symmetric with the symptomatic side. No evidence of thrombus. Normal compressibility. Common Femoral Vein: No evidence of thrombus. Normal compressibility, respiratory phasicity and response to augmentation. Saphenofemoral Junction: No evidence of thrombus. Normal compressibility and flow on color Doppler imaging. Profunda Femoral Vein: No evidence of thrombus. Normal compressibility and flow on color Doppler imaging. Femoral Vein: No evidence of thrombus. Normal compressibility, respiratory phasicity and response to augmentation. Popliteal Vein: No evidence of thrombus. Normal compressibility, respiratory phasicity and response to augmentation. Calf Veins: No evidence of thrombus. Normal compressibility and flow on color Doppler imaging. Superficial Great Saphenous Vein: Not visualized, potentially the sequela of  previous venous harvesting. Other Findings: Fem-fem bypass graft appears patent at the level of the right groin (image 28). IMPRESSION: No evidence of DVT within the left lower extremity. Electronically Signed   By: Simonne Come M.D.   On: 06/21/2023 16:47   VAS [place] ABI WITH/WO TBI Result Date: 05/28/2023  LOWER EXTREMITY DOPPLER STUDY Patient Name:  Laura Reed  Date of Exam:   05/28/2023 Medical Rec #: [id number]        Accession #:    [id number] Date of Birth: 05/22/1961        Patient Gender: F Patient Age:   47 years Exam Location:  [facility] Procedure:      VAS [place] ABI WITH/WO TBI Referring Phys: [facility] --------------------------------------------------------------------------------  Indications: Peripheral artery disease. Other Factors: H/O right 4th & 5th and left 1st & 3rd toe amputations                12/03/2020 left pop thrombectomy and new stent.  Vascular Interventions: 05/01/2023 Left CF and AT stent                         04/29/2023 Left fem-pop bypass graft                          H/O left-to-right fem-fem BPG with  lower extremity                         stents & BPGs in [place];                         10/11/09: Left external iliac/common femoral artery PTA;                         07/25/11: Right profunda femoral to TP trunk BPG;                         12/31/11: Revision of fem-fem BPG & redo of anastomosis;                         09/16/12: Fem-fem BPG, left external iliac, left common                         femoral & left profunda femoral artery PTAs;                         06/01/13: Left external iliac to profunda femoral BPG                         with revision of the fem-fem & left fem-AK popliteal BPG                         proximal anastomoses;                         08/02/13: Left distal SFA/popliteal artery PTA;                         02/20/15: Left external iliac artery PTA/stent with left                         common iliac artery PTA;                          12/10/16: Left external iliac & common femoral artery                         PTAs. Performing Technologist: Hardie Lora RVT  Examination Guidelines: A complete evaluation includes at minimum, Doppler waveform signals and systolic blood pressure reading at the level of bilateral brachial, anterior tibial, and posterior tibial arteries, when vessel segments are accessible. Bilateral testing is considered an integral part of a complete examination. Photoelectric Plethysmograph (PPG) waveforms and toe systolic pressure readings are included as required and additional duplex testing as needed. Limited examinations for reoccurring indications may be performed as noted.  ABI Findings: +---------+------------------+-----+----------+--------+ Right    Rt Pressure (mmHg)IndexWaveform  Comment  +---------+------------------+-----+----------+--------+ Brachial 104                                       +---------+------------------+-----+----------+--------+ PTA      112               0.91 biphasic           +---------+------------------+-----+----------+--------+ DP  112               0.91 monophasic         +---------+------------------+-----+----------+--------+ Great Toe83                0.67                    +---------+------------------+-----+----------+--------+ +--------+------------------+-----+----------+-------+ Left    Lt Pressure (mmHg)IndexWaveform  Comment +--------+------------------+-----+----------+-------+ [id number]                                      +--------+------------------+-----+----------+-------+ PTA     121               0.98 monophasic        +--------+------------------+-----+----------+-------+ DP      136               1.11 monophasic        +--------+------------------+-----+----------+-------+ +-------+-----------+-----------+------------+------------+ ABI/TBIToday's ABIToday's TBIPrevious ABIPrevious TBI  +-------+-----------+-----------+------------+------------+ Right  0.91       0.67       0.84        0.50         +-------+-----------+-----------+------------+------------+ Left   1.11       Amputation 0.82        Amputation   +-------+-----------+-----------+------------+------------+ Right ABIs appear essentially unchanged compared to prior study on 12/25/2022. Left ABIs appear increased compared to prior study on 12/25/2022.  Summary: Right: Resting right ankle-brachial index indicates mild right lower extremity arterial disease. The right toe-brachial index is abnormal. Left: Resting left ankle-brachial index is within normal range. *See table(s) above for measurements and observations.  Electronically signed by [name] MD on 05/28/2023 at 5:40:53 PM.    Final      Assessment/Plan 1.  Atherosclerotic occlusive disease bilateral lower extremities with numerous reconstructions now with probable abscess left groin: As noted above I have personally reviewed the CT and concur this does appear likely to be an abscess.  This represents a tremendous problem as both the femoral-femoral bypass and at the recent femoral distal bypass appear to be contiguous with the fluid collection.  My plan will be for incision and drainage of the abscess with a thorough washout of the groin.  The wound will be cultured and a stat Gram stain will be obtained.  Antibiotic beads will be reconstituted and placed along the prosthetic.  A sartorius muscle flap will be rotated over the prosthetic as well.  Once coverage has been achieved a VAC dressing will be applied.  I do not see any way to feasibly reconstruct her lower extremity arterial flow.  Furthermore, given that both the femoral-femoral and the Pham distal grafts are involved both of her lower extremities are at risk.  I discussed this with the patient her sister was in attendance.  The risks and benefits were reviewed.  As noted there really no  alternatives.  Patient agrees to proceed with washout and flap coverage.  Sister is also in agreement.  2.  Diabetes: Continue hypoglycemic medications as already ordered, these medications have been reviewed and there are no changes at this time.  Hgb A1C to be monitored as already arranged by primary service  3.  Coronary artery disease: Continue cardiac and antihypertensive medications as already ordered and reviewed, no changes at this time.  Continue statin as ordered and reviewed, no changes at this time  Nitrates  PRN for chest pain  4.   Primary hypertension: Continue antihypertensive medications as already ordered, these medications have been reviewed and there are no changes at this time.   [name], MD  06/22/2023 6:07 PM

## 2023-06-23 ENCOUNTER — Other Ambulatory Visit: Payer: Self-pay

## 2023-06-23 LAB — CBG MONITORING, ED
Glucose-Capillary: 142 mg/dL — ABNORMAL HIGH (ref 70–99)
Glucose-Capillary: 233 mg/dL — ABNORMAL HIGH (ref 70–99)
Glucose-Capillary: 227 mg/dL — ABNORMAL HIGH (ref 70–99)

## 2023-06-23 LAB — COMPREHENSIVE METABOLIC PANEL
ALT: 15 U/L (ref 0–44)
AST: 24 U/L (ref 15–41)
Albumin: 2.1 g/dL — ABNORMAL LOW (ref 3.5–5.0)
Alkaline Phosphatase: 85 U/L (ref 38–126)
Anion gap: 8 (ref 5–15)
BUN: 25 mg/dL — ABNORMAL HIGH (ref 8–23)
CO2: 17 mmol/L — ABNORMAL LOW (ref 22–32)
Calcium: 7.1 mg/dL — ABNORMAL LOW (ref 8.9–10.3)
Chloride: 105 mmol/L (ref 98–111)
Creatinine, Ser: 0.88 mg/dL (ref 0.44–1.00)
GFR, Estimated: >60 mL/min (ref >60)
Glucose, Bld: 136 mg/dL — ABNORMAL HIGH (ref 70–99)
Potassium: 3.6 mmol/L (ref 3.5–5.1)
Sodium: 130 mmol/L — ABNORMAL LOW (ref 135–145)
Total Bilirubin: 0.7 mg/dL (ref <1.2)
Total Protein: 5 g/dL — ABNORMAL LOW (ref 6.5–8.1)

## 2023-06-23 LAB — CBC
HCT: 30.5 % — ABNORMAL LOW (ref 36.0–46.0)
Hemoglobin: 10.5 g/dL — ABNORMAL LOW (ref 12.0–15.0)
MCH: 30.9 pg (ref 26.0–34.0)
MCHC: 34.4 g/dL (ref 30.0–36.0)
MCV: 89.7 fL (ref 80.0–100.0)
Platelets: 267 10*3/uL (ref 150–400)
RBC: 3.4 MIL/uL — ABNORMAL LOW (ref 3.87–5.11)
RDW: 13.3 % (ref 11.5–15.5)
WBC: 15.5 10*3/uL — ABNORMAL HIGH (ref 4.0–10.5)
nRBC: 0 % (ref 0.0–0.2)

## 2023-06-23 LAB — GLUCOSE, CAPILLARY: Glucose-Capillary: 225 mg/dL — ABNORMAL HIGH (ref 70–99)

## 2023-06-23 LAB — MRSA NEXT GEN BY PCR, NASAL: MRSA by PCR Next Gen: NOT DETECTED

## 2023-06-23 MED ORDER — CHLORHEXIDINE GLUCONATE CLOTH 2 % EX PADS
6.0000 | MEDICATED_PAD | Freq: Every day | CUTANEOUS | Status: DC
  Administered 2023-06-23 – 2023-07-24 (×28): 6 via TOPICAL

## 2023-06-23 MED ORDER — METOPROLOL TARTRATE 5 MG/5ML IV SOLN
2.5000 mg | INTRAVENOUS | Status: DC | PRN
  Administered 2023-06-24: 2.5 mg via INTRAVENOUS
  Filled 2023-06-23: qty 5

## 2023-06-23 MED ORDER — SODIUM CHLORIDE 0.9 % IV BOLUS
500.0000 mL | Freq: Once | INTRAVENOUS | Status: AC
  Administered 2023-06-23: 500 mL via INTRAVENOUS

## 2023-06-23 MED ORDER — VANCOMYCIN HCL 1250 MG/250ML IV SOLN
1250.0000 mg | INTRAVENOUS | Status: DC
Start: 2023-06-24 — End: 2023-06-23

## 2023-06-23 MED ORDER — SODIUM CHLORIDE 0.9 % IV SOLN
2.0000 g | Freq: Three times a day (TID) | INTRAVENOUS | Status: DC
  Administered 2023-06-23 – 2023-06-26 (×9): 2 g via INTRAVENOUS
  Filled 2023-06-23 (×11): qty 12.5

## 2023-06-23 MED ORDER — AMIODARONE IV BOLUS ONLY 150 MG/100ML
150.0000 mg | Freq: Once | INTRAVENOUS | Status: AC
  Administered 2023-06-24: 150 mg via INTRAVENOUS
  Filled 2023-06-23 (×2): qty 100

## 2023-06-23 MED ORDER — DAPTOMYCIN-SODIUM CHLORIDE 500-0.9 MG/50ML-% IV SOLN
8.0000 mg/kg | Freq: Every day | INTRAVENOUS | Status: DC
  Administered 2023-06-24 – 2023-06-25 (×2): 500 mg via INTRAVENOUS
  Filled 2023-06-23 (×3): qty 50

## 2023-06-23 MED ORDER — VANCOMYCIN HCL 1250 MG/250ML IV SOLN
1250.0000 mg | INTRAVENOUS | Status: DC
Start: 2023-06-23 — End: 2023-06-23
  Filled 2023-06-23: qty 250

## 2023-06-23 NOTE — Progress Notes (Addendum)
Pharmacy Antibiotic Note  Laura Reed is a 62 y.o. female admitted on 06/21/2023 with WBC 18.8 and procalcitonin 18.8, no clear source of infection identified. Patient with a fluctuant painful left groin mass - CT shows 10 cm complex collection. Vascular surgery planned for tomorrow to rule out abscess VS infected hematoma. Pharmacy has been consulted for vancomycin dosing.  Today, 06/23/2023 Day 2 of antibiotics  Zosyn changed to cefepime  WBC improving; 18.8 > 15.5  Afebrile  Renal function improving; Scr 0.88 with estimated CrCl 62.8 mL/min  Blood cultures negative to date   Plan: Adjust cefepime to 2 gm IV Q8H based on current renal function  Adjust vancomycin to 1250 mg IV Q24H  Goal AUC 400 - 550 Estimated AUC 479.1 Estimated Cmin 10.2  Scr used 0.88, IBW, Vd 0.72  Pharmacy will continue to monitor and dose adjust appropriately   Pharmacy will continue to follow and will adjust abx dosing whenever warranted.  Temp (24hrs), Avg:100 F (37.8 C), Min:98.9 F (37.2 C), Max:101.9 F (38.8 C)   Recent Labs  Lab 06/21/23 1351 06/21/23 1635 06/21/23 2304 06/22/23 0055 06/22/23 0458 06/23/23 0656  WBC 18.8*  --   --  10.8* 14.9* 15.5*  CREATININE 1.29*  --  1.14*  --  1.24* 0.88  LATICACIDVEN  --  1.1  --   --   --   --     Estimated Creatinine Clearance: 62.8 mL/min (by C-G formula based on SCr of 0.88 mg/dL).    No Known Allergies  Antimicrobials this admission: 12/16 Zosyn >> 12/16  12/16 Vancomycin >>  12/16 Cefepime >>   Microbiology results: 12/15 BCx: NGTD  Thank you for allowing pharmacy to be a part of this patient's care.  Littie Deeds, PharmD Pharmacy Resident  06/23/2023 11:40 AM

## 2023-06-23 NOTE — ED Notes (Signed)
Pt ambulatory to restroom with assistance at this time

## 2023-06-23 NOTE — Progress Notes (Signed)
Progress Note    06/23/2023 3:16 PM * No surgery found *  Subjective:  Laura Reed is a 62 yo female well known to vascular surgery and Dr Vilinda Flake MD. she has had numerous vascular reconstructions of her lower extremity.  She presented to the emergency room 2 days ago with increasing pain to her left lower extremity.  Upon workup CTA scan demonstrated a large fluid collection with numerous gas bubbles present in the left groin.  Patient was recently admitted to [facility] with an ischemic left foot and severe pain back in April 29, 2023 where she underwent a femoral to tibioperoneal trunk bypass using a PTFE distal flow graft.  She then thrombosis 48 hours later and subsequently underwent angiography with salvage of her bypass and stenting of the anterior tibial artery.  Following the second procedure which was done on 05/01/2023 she did well was discharged home approximately 10 days later.  After reviewing the scans with Dr. [name] we both concur this does appear likely to be an abscess.  There is a difficult problem for the femoral to femoral bypass and at the recent femoral distal bypass that appears to be contiguous with the fluid collection.  Dr. Vilinda Flake is planning surgery to help correct the issue.   Vitals:   06/23/23 1400 06/23/23 1415  BP: 109/60 106/60  Pulse: 73 74  Resp: 16 12  Temp:    SpO2: 99% 97%   Physical Exam: Cardiac:  RRR, normal S1 and S2.  No murmurs to appreciate. Lungs: Nonlabored breathing and clear on auscultation throughout.  No rales rhonchi or wheezing noted. Incisions: Postsurgical incisions to the left groin from prior surgeries 2 months ago. Extremities: Bilateral lower extremities do not have palpable pulses but have excellent Doppler pulses on the left. Abdomen: Positive bowel sounds throughout, soft, nontender and nondistended. Neurologic: Alert and oriented x 3, answers all questions and follows commands  appropriately.  CBC    Component Value Date/Time   WBC 15.5 (H) 06/23/2023 0656   RBC 3.40 (L) 06/23/2023 0656   HGB 10.5 (L) 06/23/2023 0656   HGB 9.5 (L) 11/02/2014 0609   HCT 30.5 (L) 06/23/2023 0656   HCT 29.5 (L) 11/02/2014 0609   PLT 267 06/23/2023 0656   PLT 217 11/02/2014 0609   MCV 89.7 06/23/2023 0656   MCV 87 11/02/2014 0609   MCH 30.9 06/23/2023 0656   MCHC 34.4 06/23/2023 0656   RDW 13.3 06/23/2023 0656   RDW 13.6 11/02/2014 0609   LYMPHSABS 1.3 09/08/2022 1044   LYMPHSABS 1.2 11/02/2014 0609   MONOABS 0.7 09/08/2022 1044   MONOABS 1.1 (H) 11/02/2014 0609   EOSABS 0.1 09/08/2022 1044   EOSABS 0.3 11/02/2014 0609   BASOSABS 0.1 09/08/2022 1044   BASOSABS 0.0 11/02/2014 0609    BMET    Component Value Date/Time   NA 130 (L) 06/23/2023 0656   NA 136 08/02/2021 1629   NA 135 11/02/2014 0609   K 3.6 06/23/2023 0656   K 4.2 11/02/2014 0609   CL 105 06/23/2023 0656   CL 107 11/02/2014 0609   CO2 17 (L) 06/23/2023 0656   CO2 23 11/02/2014 0609   GLUCOSE 136 (H) 06/23/2023 0656   GLUCOSE 108 (H) 11/02/2014 0609   BUN 25 (H) 06/23/2023 0656   BUN 25 08/02/2021 1629   BUN 20 11/02/2014 0609   CREATININE 0.88 06/23/2023 0656   CREATININE 1.01 11/09/2015 1549   CREATININE 1.01 11/09/2015 1549   CALCIUM 7.1 (  L) 06/23/2023 0656   CALCIUM 7.3 (L) 11/02/2014 0609   GFRNONAA >60 06/23/2023 0656   GFRNONAA 50 (L) 11/02/2014 0609   GFRAA >60 01/19/2019 0357   GFRAA 58 (L) 11/02/2014 0609    INR    Component Value Date/Time   INR 1.1 06/21/2023 2304   INR 0.9 10/17/2014 1131     Intake/Output Summary (Last 24 hours) at 06/23/2023 1516 Last data filed at 06/23/2023 1303 Gross per 24 hour  Intake 450 ml  Output --  Net 450 ml     Assessment/Plan:  62 y.o. female presents to [name] emergency department with left lower extremity worsening pain radiating into her left groin area. * No surgery found *   PLAN: Dr. Tammy Sours [name]'s plan will be for incision  and drainage of the abscess with a thorough washout of the left groin.  The wound will be cultured and a stat Gram stain will be obtained.  Antibiotic beads will be reconstituted and placed on the prosthetic from prior surgery.  From prior surgery.  A satoreotide muscle flap will be rotated over the prosthetic as well.  Once the coverage has been achieved a VAC dressing will be applied.  At this time there does not seem to be any feasible way to reconstruct her lower extremity arterial flow.  Furthermore given that both the femoral to femoral Pham distal grafts are involved both of her lower extremities are at high risk for claudication.  I discussed all this in detail again today with the patient at the bedside in the emergency room.  Patient verbalized her understanding and continues to want to proceed.  I answered all the patient's questions again today.  Patient will be made n.p.o. after midnight for surgery tomorrow on 06/24/2019.  Dr. [name] MD and Dr. [name] in the ER in agreement with the plan.   Marcie Bal [facility] 06/23/2023 3:16 PM

## 2023-06-23 NOTE — ED Notes (Signed)
Pt sleeping. 

## 2023-06-23 NOTE — Progress Notes (Signed)
NAME:  Laura Reed, MRN:  [id number], DOB:  1960-08-15, LOS: 2 ADMISSION DATE:  06/21/2023, CONSULTATION DATE:  06/22/23 REFERRING MD: Lorretta Harp , CHIEF COMPLAINT:  Hypotension    HPI  62 y.o female with significant PMH of HTN, HLD, DM, sCHF, CAD, PAD (s/p of left common femoral-distal bypass, thrombectomy, angioplasty and stent placement 04/2023 on aspirin, Plavix and Eliquis), anxiety, bladder cancer (s/p of nephrectomy) who presented to the ED with chief complaints of   Patient presented to [facility] urgent care on 06/21/23 with complaints of right groin pain, redness and oozing post op. Patient has hx of PAD and recently underwent left common femoral-distal bypass, thrombectomy, angioplasty and stent placement 04/2023  and right transmetatarsal amputation. States that she has been having left leg swelling. No weeping. Some pitting. She thinks she has been having fever. She was noted to be hypotensive with SBP in the 50s and was sent to the ED for further evaluation.   ED Course: Initial vital signs showed HR of 72 beats/minute, BP 136/86 mm Hg, the RR 19 breaths/minute, and the oxygen saturation 96 % on RA and a temperature of 98.95F (36.7C). . Pertinent Labs/Diagnostics Findings: Na+/ K+: 124/4.2 Glucose: 2537 BUN/Cr.:39/1.29 Calcium:  AST/ALT: WBC: 18.8K/L, Hgb/Hct: 10.6/30.7   PCT: 12.39 Lactic acid:1.1  BNP: 557.6  Imaging:Left leg arterial Duplex: US DVT Left Medication administered in the ED: Vanc and Zosyn, IVFs bolus Disposition:Admitted to stepdown under [facility] service  Past Medical History  HTN, HLD, DM, sCHF, CAD, PAD (s/p of left common femoral-distal bypass, thrombectomy, angioplasty and stent placement 04/2023 on aspirin, Plavix and Eliquis), anxiety, bladder cancer (s/p of nephrectomy)  Significant Hospital Events   12/15: Admitted to [facility] service with sepsis secondary to suspected infected groin site 12/16: Remained hypotensive despite IV fluid resuscitation requiring  vasopressors.  PCCM consulted 06/23/23- patient more stable , s/p vascular surg eval plan for OR in AM  Consults:  PCCM Vascular  Procedures:  None  Significant Diagnostic Tests:  12/15: Left leg arterial Duplex: 10.6 cm complex collection presumably hematoma overlying the common femoral vessels, without associated pseudoaneurysm, actives/p extravasation, or AV fistula.  12/15: US DVT Left IMPRESSION: No evidence of DVT within the left lower extremity.  12/16: CTA Chest, abdomen and pelvis> IMPRESSION: 1. 8 cm enhancing fluid collection with air surrounding the left common and superficial femoral artery extending to the skin surface. Findings are worrisome for abscess. 2. Wedge-shaped area of hypodensity within the spleen suspicious for infarct. 3. Left nephrectomy changes. 4. Right nonobstructing nephrolithiasis. 5. Stable left adrenal nodule previously characterized as adenoma. 6. Colonic diverticulosis. 7. Well-circumscribed distal presacral mass measuring 4.0 cm is indeterminate. Findings may represent a complex cyst, but other etiologies are not excluded. 8. Bilateral common and external iliac artery stents are present. Can not confirm flow within the stents. There is flow in the aorta. Fem-fem bypass graft appears grossly patent. 9. Aortic atherosclerosis.  Interim History / Subjective:      Micro Data:  12/15: Blood culture x2> 12/16: MRSA PCR>>   Antimicrobials:  Vancomycin 12/15>> Cefepime 12/15>>  OBJECTIVE  Blood pressure (!) 109/42, pulse 72, temperature (!) 101.9 F (38.8 C), temperature source Oral, resp. rate 14, height 5\' 7"  (1.702 m), weight 64.1 kg, SpO2 98%.        Intake/Output Summary (Last 24 hours) at 06/23/2023 0753 Last data filed at 06/22/2023 2256 Gross per 24 hour  Intake 150 ml  Output --  Net 150 ml   Ceasar Mons  Weights   06/21/23 1348  Weight: 64.1 kg   Physical Examination  GENERAL: 62 year-old female critically ill  patient lying in the bed  EYES: PEERLA. No scleral icterus. Extraocular muscles intact.  HEENT: Head atraumatic, normocephalic. Oropharynx and nasopharynx clear.  NECK:  No JVD, supple  LUNGS: Normal breath sounds bilaterally.  No use of accessory muscles of respiration.  CARDIOVASCULAR: S1, S2 normal. Murmur present, rubs, or gallops.  ABDOMEN: Soft, NTND EXTREMITIES: Left lower extremity swelling without erythema.  Capillary refill < 3 seconds in all extremities. Pulses palpable distally.transmetatarsal amputation of left foot  NEUROLOGIC: No focal neurological deficit appreciated. Cranial nerves are intact.  SKIN: No obvious rash, lesion, or ulcer. Warm to touch  Labs/imaging that I havepersonally reviewed  (right click and "Reselect all SmartList Selections" daily)     Labs   CBC: Recent Labs  Lab 06/21/23 1351 06/22/23 0055 06/22/23 0458 06/23/23 0656  WBC 18.8* 10.8* 14.9* 15.5*  HGB 10.6* 9.4* 9.8* 10.5*  HCT 30.7* 27.8* 29.2* 30.5*  MCV 86.7 89.7 89.3 89.7  PLT 234 201 238 267    Basic Metabolic Panel: Recent Labs  Lab 06/21/23 1351 06/21/23 2304 06/22/23 0458 06/23/23 0656  NA 124* 128* 126* 130*  K 4.2 4.2 4.2 3.6  CL 93* 98 99 105  CO2 24 25 19* 17*  GLUCOSE 237* 198* 258* 136*  BUN 39* 39* 37* 25*  CREATININE 1.29* 1.14* 1.24* 0.88  CALCIUM 8.0* 7.7* 7.5* 7.1*  MG 2.6*  --   --   --   PHOS 2.3*  --  3.9  --    GFR: Estimated Creatinine Clearance: 62.8 mL/min (by C-G formula based on SCr of 0.88 mg/dL). Recent Labs  Lab 06/21/23 1351 06/21/23 1635 06/22/23 0055 06/22/23 0458 06/23/23 0656  PROCALCITON 12.39  --   --   --   --   WBC 18.8*  --  10.8* 14.9* 15.5*  LATICACIDVEN  --  1.1  --   --   --     Liver Function Tests: Recent Labs  Lab 06/22/23 0458 06/23/23 0656  AST 24 24  ALT 16 15  ALKPHOS 105 85  BILITOT 0.6 0.7  PROT 5.4* 5.0*  ALBUMIN 2.4* 2.1*   No results for input(s): "LIPASE", "AMYLASE" in the last 168 hours. No  results for input(s): "AMMONIA" in the last 168 hours.  ABG    Component Value Date/Time   PHART 7.36 11/01/2015 1716   PCO2ART 35 11/01/2015 1716   PO2ART 104 11/01/2015 1716   HCO3 19.8 (L) 11/01/2015 1716   ACIDBASEDEF 4.9 (H) 11/01/2015 1716   O2SAT 97.8 11/01/2015 1716     Coagulation Profile: Recent Labs  Lab 06/21/23 2304  INR 1.1    Cardiac Enzymes: No results for input(s): "CKTOTAL", "CKMB", "CKMBINDEX", "TROPONINI" in the last 168 hours.  HbA1C: Hemoglobin A1C  Date/Time Value Ref Range Status  08/16/2013 03:39 PM 8.0 (H) 4.2 - 6.3 % Final    Comment:    The American Diabetes Association recommends that a primary goal of therapy should be <7% and that physicians should reevaluate the treatment regimen in patients with HbA1c values consistently >8%.   07/21/2011 04:20 AM 11.2 (H) 4.2 - 6.3 % Final    Comment:    The American Diabetes Association recommends that a primary goal of therapy should be <7% and that physicians should reevaluate the treatment regimen in patients with HbA1c values consistently >8%.    Hgb A1c MFr Bld  Date/Time Value Ref Range Status  01/27/2023 11:34 AM 8.0 (H) 4.6 - 6.5 % Final    Comment:    Glycemic Control Guidelines for People with Diabetes:Non Diabetic:  <6%Goal of Therapy: <7%Additional Action Suggested:  >8%   09/08/2022 10:44 AM 7.5 (H) 4.6 - 6.5 % Final    Comment:    Glycemic Control Guidelines for People with Diabetes:Non Diabetic:  <6%Goal of Therapy: <7%Additional Action Suggested:  >8%     CBG: Recent Labs  Lab 06/21/23 2132 06/22/23 0720 06/22/23 1120 06/22/23 1624 06/22/23 2112  GLUCAP 201* 204* 122* 144* 162*    Review of Systems:    10 point ROS done and is negative except as per HPI  Past Medical History  She,  has a past medical history of Absence of kidney, Anxiety, Arthritis, Atherosclerosis of native arteries of extremities with intermittent claudication, bilateral legs (HCC), Bladder cancer  (HCC), CHF (congestive heart failure) (HCC), Complication of anesthesia, Coronary artery disease, Diabetes mellitus, Family history of adverse reaction to anesthesia, GERD (gastroesophageal reflux disease), Heart murmur, Hemorrhoid, History of methicillin resistant staphylococcus aureus (MRSA) (2007), Hypertension, Neuropathy, PVD (peripheral vascular disease) (HCC), Thyroid nodule, Unspecified osteoarthritis, unspecified site, Urothelial carcinoma of kidney (HCC) (10/31/2014), Vitamin D deficiency, unspecified, and Wears dentures.   Surgical History    Past Surgical History:  Procedure Laterality Date   AMPUTATION TOE     right (4th and 5th); left (great toe, 3rd)   AMPUTATION TOE Right 07/16/2018   Procedure: AMPUTATION TOE/MPJ right 2nd;  Surgeon: Linus Galas, DPM;  Location: [facility];  Service: Podiatry;  Laterality: Right;   ARTERIAL BYPASS SURGRY  2009, 2013 x 2   right leg , done in [place]   CARDIAC CATHETERIZATION     CAROTID ENDARTERECTOMY Right 01/2014   Dr Gilda Crease   CATARACT EXTRACTION W/PHACO Right 12/14/2014   Procedure: CATARACT EXTRACTION PHACO AND INTRAOCULAR LENS PLACEMENT (IOC);  Surgeon: Lia Hopping, MD;  Location: [facility];  Service: Ophthalmology;  Laterality: Right;  [place]   00:38.6              AP        7.1                   CDE  2.76   CATARACT EXTRACTION W/PHACO Left 12/06/2019   Procedure: CATARACT EXTRACTION PHACO AND INTRAOCULAR LENS PLACEMENT (IOC) LEFT DIABETIC;  Surgeon: Galen Manila, MD;  Location: [facility];  Service: Ophthalmology;  Laterality: Left;  9.08 1:06.4   CESAREAN SECTION     CHOLECYSTECTOMY  03-03-12   Porcelain gallbladder, gallstones,  [place]   COLONOSCOPY W/ BIOPSIES  04/28/2012   Hyperplastic rectal polyps.   COLONOSCOPY WITH PROPOFOL N/A 04/02/2022   Procedure: COLONOSCOPY WITH PROPOFOL;  Surgeon: Earline Mayotte, MD;  Location: [facility];  Service: Endoscopy;  Laterality: N/A;   CORONARY ARTERY BYPASS GRAFT  2009   3  vessel   CYSTOSCOPY W/ RETROGRADES Right 09/01/2016   Procedure: CYSTOSCOPY WITH RETROGRADE PYELOGRAM;  Surgeon: Vanna Scotland, MD;  Location: [facility];  Service: Urology;  Laterality: Right;   CYSTOSCOPY W/ RETROGRADES Bilateral 03/19/2020   Procedure: CYSTOSCOPY WITH RETROGRADE PYELOGRAM;  Surgeon: Vanna Scotland, MD;  Location: [facility];  Service: Urology;  Laterality: Bilateral;   CYSTOSCOPY WITH BIOPSY N/A 03/19/2020   Procedure: CYSTOSCOPY WITH BIOPSY;  Surgeon: Vanna Scotland, MD;  Location: [facility];  Service: Urology;  Laterality: N/A;   EYE SURGERY     FEMORAL-POPLITEAL BYPASS GRAFT Left 04/29/2023  Procedure: BYPASS GRAFT FEMORAL-POPLITEAL ARTERY;  Surgeon: Renford Dills, MD;  Location: [facility];  Service: Vascular;  Laterality: Left;   HERNIA REPAIR  10-31-14   ventral, retro-rectus atrium mesh   IRRIGATION AND DEBRIDEMENT FOOT Left 01/18/2019   Procedure: IRRIGATION AND DEBRIDEMENT FOOT;  Surgeon: Linus Galas, DPM;  Location: [facility];  Service: Podiatry;  Laterality: Left;   LOWER EXTREMITY ANGIOGRAPHY Left 12/10/2016   Procedure: Lower Extremity Angiography;  Surgeon: Renford Dills, MD;  Location: [facility];  Service: Cardiovascular;  Laterality: Left;   LOWER EXTREMITY ANGIOGRAPHY Left 02/02/2018   Procedure: LOWER EXTREMITY ANGIOGRAPHY;  Surgeon: Renford Dills, MD;  Location: [facility];  Service: Cardiovascular;  Laterality: Left;   LOWER EXTREMITY ANGIOGRAPHY Left 05/05/2018   Procedure: LOWER EXTREMITY ANGIOGRAPHY;  Surgeon: Renford Dills, MD;  Location: [facility];  Service: Cardiovascular;  Laterality: Left;   LOWER EXTREMITY ANGIOGRAPHY Left 12/04/2020   Procedure: LOWER EXTREMITY ANGIOGRAPHY with Intervention;  Surgeon: Renford Dills, MD;  Location: [facility];  Service: Cardiovascular;  Laterality: Left;   LOWER EXTREMITY ANGIOGRAPHY Left 04/24/2023   Procedure: Lower Extremity Angiography;  Surgeon: Renford Dills, MD;  Location: [facility];  Service: Cardiovascular;  Laterality: Left;   LOWER EXTREMITY ANGIOGRAPHY Left 05/01/2023   Procedure: Lower Extremity Angiography;  Surgeon: Renford Dills, MD;  Location: [facility];  Service: Cardiovascular;  Laterality: Left;   NEPHRECTOMY Left 10-31-14   PERIPHERAL VASCULAR CATHETERIZATION Left 05/01/2015   Procedure: Lower Extremity Angiography;  Surgeon: Renford Dills, MD;  Location: [facility];  Service: Cardiovascular;  Laterality: Left;   PERIPHERAL VASCULAR CATHETERIZATION  05/01/2015   Procedure: Lower Extremity Intervention;  Surgeon: Renford Dills, MD;  Location: [facility];  Service: Cardiovascular;;   PERIPHERAL VASCULAR CATHETERIZATION Left 02/20/2015   Procedure: Pelvic Angiography;  Surgeon: Renford Dills, MD;  Location: [facility];  Service: Cardiovascular;  Laterality: Left;   TRANSMETATARSAL AMPUTATION Left 05/05/2023   Procedure: TRANSMETATARSAL AMPUTATION LEFT;  Surgeon: Gwyneth Revels, DPM;  Location: [facility];  Service: Orthopedics/Podiatry;  Laterality: Left;   TRANSURETHRAL RESECTION OF BLADDER TUMOR WITH MITOMYCIN-C N/A 09/01/2016   Procedure: TRANSURETHRAL RESECTION OF BLADDER TUMOR WITH MITOMYCIN-C;  Surgeon: Vanna Scotland, MD;  Location: [facility];  Service: Urology;  Laterality: N/A;     Social History   reports that she quit smoking about 10 years ago. Her smoking use included cigarettes. She started smoking about 45 years ago. She has a 70 pack-year smoking history. She has never used smokeless tobacco. She reports that she does not currently use alcohol. She reports that she does not currently use drugs after having used the following drugs: Cocaine.   Family History   Her family history includes Breast cancer in her maternal grandmother; Cancer (age of onset: 49) in her mother; Cancer (age of onset: 62) in her father; Diabetes in her son. There is no history  of Kidney cancer, Bladder Cancer, or Prostate cancer.   Allergies No Known Allergies   Home Medications  Prior to Admission medications   Medication Sig Start Date End Date Taking? Authorizing Provider  acetaminophen (TYLENOL) 500 MG tablet Take 1,000 mg by mouth every 6 (six) hours as needed for mild pain or headache.    Yes [provider]  apixaban (ELIQUIS) 2.5 MG TABS tablet Take 1 tablet (2.5 mg total) by mouth 2 (two) times daily. 05/11/23  Yes Pace, Huel Cote R, NP  aspirin 81 MG chewable tablet Chew 81 mg by mouth at bedtime.    Yes [provider]  Cholecalciferol (VITAMIN D3) 2000 UNITS TABS Take 2,000 Units by mouth daily after supper.    Yes [provider]  clopidogrel (PLAVIX) 75 MG tablet Take 1 tablet (75 mg total) by mouth daily at 6 (six) AM. 05/12/23  Yes Pace, Brien R, NP  empagliflozin (JARDIANCE) 25 MG TABS tablet TAKE 1 TABLET(25 MG) BY MOUTH DAILY BEFORE AND BREAKFAST 09/08/22  Yes Sherlene Shams, MD  gabapentin (NEURONTIN) 300 MG capsule Take 300 mg by mouth 2 (two) times daily.   Yes [provider]  insulin degludec (TRESIBA FLEXTOUCH) 100 UNIT/ML FlexTouch Pen Inject 20 Units into the skin daily. Patient taking differently: Inject 22 Units into the skin daily. 09/08/22  Yes Sherlene Shams, MD  insulin lispro (HUMALOG KWIKPEN) 100 UNIT/ML KwikPen ADMINISTER 10 UNITS UNDER THE SKIN THREE TIMES DAILY BEFORE MEALS Patient taking differently: ADMINISTER 10 UNITS UNDER THE SKIN THREE TIMES DAILY BEFORE MEALS PRN 12/28/21  Yes Worthy Rancher B, FNP  losartan (COZAAR) 50 MG tablet Take 50 mg by mouth daily.   Yes [provider]  Multiple Vitamin (MULTIVITAMIN) tablet Take 1 tablet by mouth daily.   Yes [provider]  triamcinolone ointment (KENALOG) 0.5 % Apply 1 Application topically 2 (two) times daily. 01/27/23  Yes Sherlene Shams, MD  ALPRAZolam Prudy Feeler) 0.5 MG tablet Take 1 tablet (0.5 mg total) by mouth 2 (two) times daily  as needed for anxiety. Patient not taking: Reported on 06/21/2023 05/11/23   Marcie Bal, NP  atorvastatin (LIPITOR) 20 MG tablet TAKE 1 TABLET(20 MG) BY MOUTH DAILY 01/22/23   Sherlene Shams, MD  Continuous Glucose Sensor (FREESTYLE LIBRE 2 SENSOR) MISC Use to check sugar at least 4 times daily 05/05/23   Sherlene Shams, MD  glucose blood test strip Use you check blood sugars up to four times daily. 03/25/22   Sherlene Shams, MD  Insulin Pen Needle (PEN NEEDLES) 32G X 4 MM MISC Use to take insulin daily 03/31/23   Sherlene Shams, MD  multivitamin-lutein [facility]) CAPS capsule Take 1 capsule by mouth daily. Patient not taking: Reported on 06/21/2023    [provider]  Scheduled Meds:  aspirin  81 mg Oral QHS   atorvastatin  20 mg Oral Daily   cholecalciferol  2,000 Units Oral Daily   gabapentin  300 mg Oral BID   insulin aspart  0-5 Units Subcutaneous QHS   insulin aspart  0-9 Units Subcutaneous TID WC   insulin glargine-yfgn  14 Units Subcutaneous QHS   midodrine  10 mg Oral TID WC   multivitamin with minerals  1 tablet Oral Daily   sodium chloride  1 g Oral BID WC   Continuous Infusions:  sodium chloride 75 mL/hr at 06/22/23 0121   ceFEPime (MAXIPIME) IV Stopped (06/22/23 2256)   norepinephrine (LEVOPHED) Adult infusion 6 mcg/min (06/23/23 0705)   vancomycin     PRN Meds:.acetaminophen, ALPRAZolam, hydrALAZINE, ondansetron (ZOFRAN) IV, oxyCODONE-acetaminophen   Active Hospital Problem list   See systems below  Assessment & Plan:  #Sepsis with septic shock  #Right groin abscess #?Infectious diarrhea  Patient presenting with left leg pain and diarrhea.  CT abd/pelvis concerning for an abscess surrounding the left common and superficial femoral artery the possible splenic infarct  -F/u cultures, trend lactic/ PCT -GI panel and C. difficile pending -Monitor  WBC/ fever curve -Continue Zosyn & vancomycin -IVF hydration as needed -Pressors for MAP goal  >65 -Strict I/O's  #PAD s/p of left common femoral-distal bypass, thrombectomy, angioplasty and stent placement 04/2023  on aspirin, Plavix and Eliquis   -[place] negative for DVT.  Left leg arterial duplex showed 10.6 cm complex collection concerning for seroma versus hematoma -Follow-up CT abdomen/pelvis concerning for abscess and possible splenic infarct. Discussed with vascular surgeon Dr. Wallace Cullens will evaluate in the morning -Continue aspirin Plavix -Hold Eliquis and continue IV heparin   #Acute Kidney Injury likely ATN #Hyponatremia PMHx: bladder cancer (s/p of nephrectomy  -Monitor I&O's / urinary output -Follow BMP -Check TSH and free T4 -Ensure adequate renal perfusion -Avoid nephrotoxic agents as able -Replace electrolytes as indicated   #Chronic dCHF PMHx: HTN, HLD Echo 12/2021: normal LVEF  -BNP>557.6, no evidence of volume overload -Hold GDMT for for now -Continue atorvastatin, aspirin and Plavix -Obtain 2D echocardiogram  #T2DM -Check hemoglobin A1c -CBG's q4; Target range of 140 to 180 -SSI -Start basal insulin once able to tolerate p.o. -Follow ICU Hypo/Hyperglycemia protocol      Best practice:  Diet:  Oral Pain/Anxiety/Delirium protocol (if indicated): No VAP protocol (if indicated): Not indicated DVT prophylaxis: Systemic AC GI prophylaxis: N/A Glucose control:  SSI Yes Central venous access:  N/A Arterial line:  N/A Foley:  N/A Mobility:  bed rest  PT consulted: N/A Last date of multidisciplinary goals of care discussion [12/16] Code Status:  full code Disposition: ICU   = Goals of Care = Code Status Order: FULL  Primary Emergency Contact: Dent,Sharon, Home Phone: [phone] Wishes to pursue full aggressive treatment and intervention options, including CPR and intubation, but goals of care will be addressed on going with family if that should become necessary.   Critical care provider statement:   Total critical care time: 33 minutes    Performed by: Karna Christmas MD   Critical care time was exclusive of separately billable procedures and treating other patients.   Critical care was necessary to treat or prevent imminent or life-threatening deterioration.   Critical care was time spent personally by me on the following activities: development of treatment plan with patient and/or surrogate as well as nursing, discussions with consultants, evaluation of patient's response to treatment, examination of patient, obtaining history from patient or surrogate, ordering and performing treatments and interventions, ordering and review of laboratory studies, ordering and review of radiographic studies, pulse oximetry and re-evaluation of patient's condition.    Vida Rigger, M.D.  Pulmonary & Critical Care Medicine

## 2023-06-23 NOTE — Progress Notes (Signed)
eLink Physician-Brief Progress Note Patient Name: Laura Reed DOB: 1961/06/09 MRN: [id number]   Date of Service  06/23/2023  HPI/Events of Note  Patient transferred to the ICU for septic shock secondary to right groin abscess which followed right femoral access for vascular surgery.  eICU Interventions  New Patient Evaluation.        Migdalia Dk 06/23/2023, 7:44 PM

## 2023-06-23 NOTE — ED Notes (Signed)
Assisted pt with ambulating from bed to toilet. Pt tolerated without problems.

## 2023-06-23 NOTE — Progress Notes (Incomplete)
Atrial Fibrillation with Rapid Ventricular Response CHA2DS2-VASc score: 5 points - Discussed with patient, will try 500 mL bolus + lopressor IVP as patient is not currently on Eliquis (holding for vascular procedure) if this doesn't work to control her rate and stabilize her BP will administer amiodarone bolus. - consider Amiodarone as discussed above - restart outpatient Eliquis vs heparin drip once vascular surgery clears her for systemic anticoagulation - TSH WNL - Echocardiogram ordered - Consider Cardiology consultation, patient was scheduled for a Holter monitor outpatient that hadn't arrived y - Continuous cardiac monitoring

## 2023-06-23 NOTE — ED Notes (Signed)
1 person assist from bed to toilet. Pt tolerated activity without complication. Pt returned to bed, cardiac monitoring resumed. All items placed within reach. Call bell within reach.

## 2023-06-23 NOTE — Progress Notes (Addendum)
Atrial Fibrillation with Rapid Ventricular Response CHA2DS2-VASc score: 5 points - Discussed with patient, will try 500 mL bolus + lopressor IVP as patient is not currently on Eliquis (holding for vascular procedure) or Heparin drip (due to concern for hematoma) if this doesn't work to control her rate and stabilize her BP will administer amiodarone bolus. - consider Amiodarone as discussed above - restart outpatient Eliquis vs heparin drip once vascular surgery clears her for systemic anticoagulation. Too near to vascular procedure to initiate drip at this time - TSH WNL, f/u STAT K+/ Phos/ Mag - Echocardiogram ordered - Consider Cardiology consultation, patient was scheduled for a Holter monitor outpatient that hadn't arrived yet before this admission - Continuous cardiac monitoring   Cheryll Cockayne Rust-Chester, AGACNP-BC Acute Care Nurse Practitioner [facility]   [phone] / [phone] Please see Amion for details.

## 2023-06-24 ENCOUNTER — Inpatient Hospital Stay: Payer: 59 | Admitting: Certified Registered"

## 2023-06-24 ENCOUNTER — Inpatient Hospital Stay (HOSPITAL_COMMUNITY)
Admit: 2023-06-24 | Discharge: 2023-06-24 | Disposition: A | Discharge status: Home / Self Care | Payer: 59 | Attending: Vascular Surgery | Admitting: Vascular Surgery

## 2023-06-24 ENCOUNTER — Encounter: Payer: Self-pay | Admitting: Internal Medicine

## 2023-06-24 ENCOUNTER — Encounter
Admission: EM | Disposition: A | Discharge status: Home Health | Payer: Self-pay | Source: Home / Self Care | Attending: Internal Medicine

## 2023-06-24 DIAGNOSIS — I4891 Unspecified atrial fibrillation: Secondary | ICD-10-CM | POA: Diagnosis not present

## 2023-06-24 DIAGNOSIS — T827XXA Infection and inflammatory reaction due to other cardiac and vascular devices, implants and grafts, initial encounter: Secondary | ICD-10-CM | POA: Diagnosis not present

## 2023-06-24 DIAGNOSIS — I97648 Postprocedural seroma of a circulatory system organ or structure following other circulatory system procedure: Secondary | ICD-10-CM | POA: Diagnosis not present

## 2023-06-24 DIAGNOSIS — I70203 Unspecified atherosclerosis of native arteries of extremities, bilateral legs: Secondary | ICD-10-CM | POA: Diagnosis not present

## 2023-06-24 DIAGNOSIS — Z9889 Other specified postprocedural states: Secondary | ICD-10-CM | POA: Diagnosis not present

## 2023-06-24 HISTORY — PX: INCISION AND DRAINAGE ABSCESS: SHX5864

## 2023-06-24 LAB — COMPREHENSIVE METABOLIC PANEL
ALT: 14 U/L (ref 0–44)
AST: 16 U/L (ref 15–41)
Albumin: 2.2 g/dL — ABNORMAL LOW (ref 3.5–5.0)
Alkaline Phosphatase: 113 U/L (ref 38–126)
Anion gap: 7 (ref 5–15)
BUN: 19 mg/dL (ref 8–23)
CO2: 21 mmol/L — ABNORMAL LOW (ref 22–32)
Calcium: 7.5 mg/dL — ABNORMAL LOW (ref 8.9–10.3)
Chloride: 104 mmol/L (ref 98–111)
Creatinine, Ser: 0.72 mg/dL (ref 0.44–1.00)
GFR, Estimated: >60 mL/min (ref >60)
Glucose, Bld: 253 mg/dL — ABNORMAL HIGH (ref 70–99)
Potassium: 3.6 mmol/L (ref 3.5–5.1)
Sodium: 132 mmol/L — ABNORMAL LOW (ref 135–145)
Total Bilirubin: 0.4 mg/dL (ref <1.2)
Total Protein: 5.5 g/dL — ABNORMAL LOW (ref 6.5–8.1)

## 2023-06-24 LAB — CBC
HCT: 33.1 % — ABNORMAL LOW (ref 36.0–46.0)
Hemoglobin: 11.2 g/dL — ABNORMAL LOW (ref 12.0–15.0)
MCH: 29.7 pg (ref 26.0–34.0)
MCHC: 33.8 g/dL (ref 30.0–36.0)
MCV: 87.8 fL (ref 80.0–100.0)
Platelets: 326 10*3/uL (ref 150–400)
RBC: 3.77 MIL/uL — ABNORMAL LOW (ref 3.87–5.11)
RDW: 13.4 % (ref 11.5–15.5)
WBC: 18.7 10*3/uL — ABNORMAL HIGH (ref 4.0–10.5)
nRBC: 0 % (ref 0.0–0.2)

## 2023-06-24 LAB — POTASSIUM: Potassium: 3.5 mmol/L (ref 3.5–5.1)

## 2023-06-24 LAB — PHOSPHORUS
Phosphorus: 2 mg/dL — ABNORMAL LOW (ref 2.5–4.6)
Phosphorus: 2.5 mg/dL (ref 2.5–4.6)

## 2023-06-24 LAB — GLUCOSE, CAPILLARY
Glucose-Capillary: 239 mg/dL — ABNORMAL HIGH (ref 70–99)
Glucose-Capillary: 241 mg/dL — ABNORMAL HIGH (ref 70–99)
Glucose-Capillary: 204 mg/dL — ABNORMAL HIGH (ref 70–99)
Glucose-Capillary: 220 mg/dL — ABNORMAL HIGH (ref 70–99)
Glucose-Capillary: 280 mg/dL — ABNORMAL HIGH (ref 70–99)

## 2023-06-24 LAB — CK: Total CK: 34 U/L — ABNORMAL LOW (ref 38–234)

## 2023-06-24 LAB — MAGNESIUM
Magnesium: 1.9 mg/dL (ref 1.7–2.4)
Magnesium: 2 mg/dL (ref 1.7–2.4)

## 2023-06-24 SURGERY — INCISION AND DRAINAGE, ABSCESS
Anesthesia: General | Laterality: Left

## 2023-06-24 MED ORDER — AMIODARONE HCL IN DEXTROSE 360-4.14 MG/200ML-% IV SOLN
30.0000 mg/h | INTRAVENOUS | Status: DC
Start: 2023-06-24 — End: 2023-06-26
  Administered 2023-06-24 (×2): 30 mg/h via INTRAVENOUS
  Filled 2023-06-24 (×2): qty 200

## 2023-06-24 MED ORDER — VANCOMYCIN HCL 1000 MG IV SOLR
INTRAVENOUS | Status: DC | PRN
  Administered 2023-06-24 (×2): 1000 mg via TOPICAL

## 2023-06-24 MED ORDER — TOBRAMYCIN SULFATE 1.2 G IJ SOLR
INTRAMUSCULAR | Status: DC | PRN
  Administered 2023-06-24: 1.2 g via TOPICAL

## 2023-06-24 MED ORDER — ROCURONIUM BROMIDE 100 MG/10ML IV SOLN
INTRAVENOUS | Status: DC | PRN
  Administered 2023-06-24: 50 mg via INTRAVENOUS
  Administered 2023-06-24: 10 mg via INTRAVENOUS

## 2023-06-24 MED ORDER — PHENYLEPHRINE HCL (PRESSORS) 10 MG/ML IV SOLN
INTRAVENOUS | Status: AC
  Filled 2023-06-24: qty 1

## 2023-06-24 MED ORDER — EPHEDRINE 5 MG/ML INJ
INTRAVENOUS | Status: AC
  Filled 2023-06-24: qty 5

## 2023-06-24 MED ORDER — AMIODARONE IV BOLUS ONLY 150 MG/100ML
150.0000 mg | Freq: Once | INTRAVENOUS | Status: AC
  Administered 2023-06-24: 150 mg via INTRAVENOUS

## 2023-06-24 MED ORDER — GENTAMICIN SULFATE 40 MG/ML IJ SOLN
INTRAMUSCULAR | Status: DC | PRN
  Administered 2023-06-24: 80 mg

## 2023-06-24 MED ORDER — DEXAMETHASONE SODIUM PHOSPHATE 10 MG/ML IJ SOLN
INTRAMUSCULAR | Status: DC | PRN
  Administered 2023-06-24: 5 mg via INTRAVENOUS

## 2023-06-24 MED ORDER — INSULIN GLARGINE-YFGN 100 UNIT/ML ~~LOC~~ SOLN
17.0000 [IU] | Freq: Every day | SUBCUTANEOUS | Status: DC
  Administered 2023-06-24: 17 [IU] via SUBCUTANEOUS
  Filled 2023-06-24 (×2): qty 0.17

## 2023-06-24 MED ORDER — FENTANYL CITRATE (PF) 100 MCG/2ML IJ SOLN
INTRAMUSCULAR | Status: DC | PRN
  Administered 2023-06-24 (×2): 50 ug via INTRAVENOUS

## 2023-06-24 MED ORDER — ONDANSETRON HCL 4 MG/2ML IJ SOLN
INTRAMUSCULAR | Status: AC
  Filled 2023-06-24: qty 2

## 2023-06-24 MED ORDER — SODIUM CHLORIDE 0.9 % IV SOLN: INTRAVENOUS | Status: DC | PRN

## 2023-06-24 MED ORDER — PHENYLEPHRINE 80 MCG/ML (10ML) SYRINGE FOR IV PUSH (FOR BLOOD PRESSURE SUPPORT)
PREFILLED_SYRINGE | INTRAVENOUS | Status: DC | PRN
  Administered 2023-06-24 (×3): 160 ug via INTRAVENOUS

## 2023-06-24 MED ORDER — FENTANYL CITRATE (PF) 100 MCG/2ML IJ SOLN: 25.0000 ug | INTRAMUSCULAR | Status: DC | PRN

## 2023-06-24 MED ORDER — ROCURONIUM BROMIDE 10 MG/ML (PF) SYRINGE
PREFILLED_SYRINGE | INTRAVENOUS | Status: AC
  Filled 2023-06-24: qty 10

## 2023-06-24 MED ORDER — PHENYLEPHRINE 80 MCG/ML (10ML) SYRINGE FOR IV PUSH (FOR BLOOD PRESSURE SUPPORT)
PREFILLED_SYRINGE | INTRAVENOUS | Status: AC
  Filled 2023-06-24: qty 10

## 2023-06-24 MED ORDER — PROPOFOL 10 MG/ML IV BOLUS
INTRAVENOUS | Status: DC | PRN
  Administered 2023-06-24: 90 mg via INTRAVENOUS

## 2023-06-24 MED ORDER — VANCOMYCIN HCL 1000 MG IV SOLR
INTRAVENOUS | Status: AC
  Filled 2023-06-24: qty 20

## 2023-06-24 MED ORDER — LIDOCAINE HCL (CARDIAC) PF 100 MG/5ML IV SOSY
PREFILLED_SYRINGE | INTRAVENOUS | Status: DC | PRN
  Administered 2023-06-24: 80 mg via INTRAVENOUS

## 2023-06-24 MED ORDER — BUPIVACAINE HCL (PF) 0.5 % IJ SOLN
INTRAMUSCULAR | Status: AC
  Filled 2023-06-24: qty 30

## 2023-06-24 MED ORDER — MIDAZOLAM HCL 2 MG/2ML IJ SOLN
INTRAMUSCULAR | Status: DC | PRN
  Administered 2023-06-24: 2 mg via INTRAVENOUS

## 2023-06-24 MED ORDER — FENTANYL CITRATE (PF) 100 MCG/2ML IJ SOLN
INTRAMUSCULAR | Status: AC
  Filled 2023-06-24: qty 2

## 2023-06-24 MED ORDER — EPHEDRINE SULFATE-NACL 50-0.9 MG/10ML-% IV SOSY
PREFILLED_SYRINGE | INTRAVENOUS | Status: DC | PRN
  Administered 2023-06-24 (×2): 10 mg via INTRAVENOUS

## 2023-06-24 MED ORDER — INSULIN ASPART 100 UNIT/ML IJ SOLN
0.0000 [IU] | Freq: Three times a day (TID) | INTRAMUSCULAR | Status: DC
  Administered 2023-06-24: 8 [IU] via SUBCUTANEOUS
  Administered 2023-06-25: 5 [IU] via SUBCUTANEOUS
  Administered 2023-06-25 (×2): 2 [IU] via SUBCUTANEOUS
  Administered 2023-06-25: 5 [IU] via SUBCUTANEOUS
  Administered 2023-06-26: 2 [IU] via SUBCUTANEOUS
  Administered 2023-06-27: 3 [IU] via SUBCUTANEOUS
  Administered 2023-06-27: 2 [IU] via SUBCUTANEOUS
  Administered 2023-06-27 – 2023-06-29 (×3): 3 [IU] via SUBCUTANEOUS
  Administered 2023-06-29 (×3): 2 [IU] via SUBCUTANEOUS
  Administered 2023-06-30: 3 [IU] via SUBCUTANEOUS
  Administered 2023-06-30: 2 [IU] via SUBCUTANEOUS
  Administered 2023-07-01 (×3): 3 [IU] via SUBCUTANEOUS
  Administered 2023-07-02: 5 [IU] via SUBCUTANEOUS
  Administered 2023-07-02 – 2023-07-03 (×3): 2 [IU] via SUBCUTANEOUS
  Administered 2023-07-04: 3 [IU] via SUBCUTANEOUS
  Administered 2023-07-04 – 2023-07-05 (×4): 2 [IU] via SUBCUTANEOUS
  Administered 2023-07-05 – 2023-07-06 (×2): 5 [IU] via SUBCUTANEOUS
  Administered 2023-07-07: 2 [IU] via SUBCUTANEOUS
  Administered 2023-07-07: 5 [IU] via SUBCUTANEOUS
  Administered 2023-07-09 – 2023-07-10 (×4): 2 [IU] via SUBCUTANEOUS
  Filled 2023-06-24 (×35): qty 1

## 2023-06-24 MED ORDER — GENTAMICIN SULFATE 40 MG/ML IJ SOLN
INTRAMUSCULAR | Status: AC
  Filled 2023-06-24: qty 2

## 2023-06-24 MED ORDER — POTASSIUM PHOSPHATES 15 MMOLE/5ML IV SOLN
15.0000 mmol | Freq: Once | INTRAVENOUS | Status: AC
  Administered 2023-06-24: 15 mmol via INTRAVENOUS
  Filled 2023-06-24: qty 5

## 2023-06-24 MED ORDER — AMIODARONE IV BOLUS ONLY 150 MG/100ML
INTRAVENOUS | Status: AC
  Filled 2023-06-24: qty 100

## 2023-06-24 MED ORDER — DROPERIDOL 2.5 MG/ML IJ SOLN: 0.6250 mg | Freq: Once | INTRAMUSCULAR | Status: DC | PRN

## 2023-06-24 MED ORDER — AMIODARONE HCL IN DEXTROSE 360-4.14 MG/200ML-% IV SOLN
60.0000 mg/h | INTRAVENOUS | Status: AC
  Administered 2023-06-24: 60 mg/h via INTRAVENOUS
  Filled 2023-06-24: qty 200

## 2023-06-24 MED ORDER — LIDOCAINE HCL (PF) 2 % IJ SOLN
INTRAMUSCULAR | Status: AC
  Filled 2023-06-24: qty 5

## 2023-06-24 MED ORDER — ONDANSETRON HCL 4 MG/2ML IJ SOLN
INTRAMUSCULAR | Status: DC | PRN
  Administered 2023-06-24: 4 mg via INTRAVENOUS

## 2023-06-24 MED ORDER — 0.9 % SODIUM CHLORIDE (POUR BTL) OPTIME
TOPICAL | Status: DC | PRN
  Administered 2023-06-24: 1000 mL

## 2023-06-24 MED ORDER — CEFAZOLIN SODIUM-DEXTROSE 2-4 GM/100ML-% IV SOLN
2.0000 g | INTRAVENOUS | Status: AC
  Administered 2023-06-24: 2 g via INTRAVENOUS
  Filled 2023-06-24: qty 100

## 2023-06-24 MED ORDER — PHENYLEPHRINE HCL-NACL 20-0.9 MG/250ML-% IV SOLN
0.0000 ug/min | INTRAVENOUS | Status: DC
  Administered 2023-06-24: 150 ug/min via INTRAVENOUS
  Administered 2023-06-24: 160 ug/min via INTRAVENOUS
  Administered 2023-06-24 (×2): 140 ug/min via INTRAVENOUS
  Administered 2023-06-24: 160 ug/min via INTRAVENOUS
  Administered 2023-06-24: 70 ug/min via INTRAVENOUS
  Filled 2023-06-24 (×9): qty 250

## 2023-06-24 MED ORDER — DEXAMETHASONE SODIUM PHOSPHATE 10 MG/ML IJ SOLN
INTRAMUSCULAR | Status: AC
  Filled 2023-06-24: qty 1

## 2023-06-24 MED ORDER — CHLORHEXIDINE GLUCONATE 4 % EX SOLN
60.0000 mL | Freq: Once | CUTANEOUS | Status: AC
Start: 2023-06-25 — End: 2023-06-24
  Administered 2023-06-24: 4 via TOPICAL

## 2023-06-24 MED ORDER — SODIUM CHLORIDE 0.9 % IR SOLN
Status: DC | PRN
  Administered 2023-06-24: 3000 mL

## 2023-06-24 MED ORDER — PROPOFOL 10 MG/ML IV BOLUS
INTRAVENOUS | Status: AC
Start: 2023-06-24 — End: ?
  Filled 2023-06-24: qty 20

## 2023-06-24 MED ORDER — TOBRAMYCIN SULFATE 1.2 G IJ SOLR
INTRAMUSCULAR | Status: AC
  Filled 2023-06-24: qty 1.2

## 2023-06-24 MED ORDER — CHLORHEXIDINE GLUCONATE 4 % EX SOLN
60.0000 mL | Freq: Once | CUTANEOUS | Status: AC
  Administered 2023-06-24: 4 via TOPICAL

## 2023-06-24 MED ORDER — NEOMYCIN-POLYMYXIN B GU 40-200000 IR SOLN
Status: AC
  Filled 2023-06-24: qty 20

## 2023-06-24 MED ORDER — NEOMYCIN-POLYMYXIN B GU 40-200000 IR SOLN
Status: DC | PRN
  Administered 2023-06-24: 1 mL

## 2023-06-24 MED ORDER — SODIUM CHLORIDE 0.9 % IV SOLN: INTRAVENOUS | Status: DC

## 2023-06-24 MED ORDER — BUPIVACAINE LIPOSOME 1.3 % IJ SUSP
INTRAMUSCULAR | Status: AC
  Filled 2023-06-24: qty 20

## 2023-06-24 MED ORDER — SUGAMMADEX SODIUM 200 MG/2ML IV SOLN
INTRAVENOUS | Status: DC | PRN
  Administered 2023-06-24: 200 mg via INTRAVENOUS

## 2023-06-24 MED ORDER — MIDAZOLAM HCL 2 MG/2ML IJ SOLN
INTRAMUSCULAR | Status: AC
  Filled 2023-06-24: qty 2

## 2023-06-24 SURGICAL SUPPLY — 39 items
BENZOIN TINCTURE PRP APPL 2/3 (GAUZE/BANDAGES/DRESSINGS) IMPLANT
CANISTER WOUND CARE 500ML ATS (WOUND CARE) IMPLANT
CHLORAPREP W/TINT 26 (MISCELLANEOUS) ×1 IMPLANT
DRAPE INCISE IOBAN 66X45 STRL (DRAPES) IMPLANT
DRAPE LAPAROTOMY 100X77 ABD (DRAPES) ×1 IMPLANT
DRSG VAC GRANUFOAM MED (GAUZE/BANDAGES/DRESSINGS) IMPLANT
ELECT REM PT RETURN 9FT ADLT (ELECTROSURGICAL) ×1
ELECTRODE REM PT RTRN 9FT ADLT (ELECTROSURGICAL) ×1 IMPLANT
GAUZE 4X4 16PLY ~~LOC~~+RFID DBL (SPONGE) ×1 IMPLANT
GAUZE SPONGE 4X4 12PLY STRL (GAUZE/BANDAGES/DRESSINGS) ×1 IMPLANT
GLOVE BIO SURGEON STRL SZ7 (GLOVE) ×1 IMPLANT
GLOVE SURG SYN 8.0 (GLOVE) ×1 IMPLANT
GLOVE SURG SYN 8.0 PF PI (GLOVE) ×1 IMPLANT
GOWN STRL REUS W/ TWL LRG LVL3 (GOWN DISPOSABLE) ×2 IMPLANT
GOWN STRL REUS W/ TWL XL LVL3 (GOWN DISPOSABLE) ×1 IMPLANT
KIT STIMULAN RAPID CURE 5CC (Orthopedic Implant) IMPLANT
KIT TURNOVER KIT A (KITS) ×1 IMPLANT
LABEL OR SOLS (LABEL) ×1 IMPLANT
MANIFOLD NEPTUNE II (INSTRUMENTS) ×1 IMPLANT
NDL HYPO 22X1.5 SAFETY MO (MISCELLANEOUS) IMPLANT
NEEDLE HYPO 22X1.5 SAFETY MO (MISCELLANEOUS) IMPLANT
NS IRRIG 500ML POUR BTL (IV SOLUTION) ×1 IMPLANT
PACK BASIN MINOR ARMC (MISCELLANEOUS) ×1 IMPLANT
PAD NEG PRESSURE SENSATRAC (MISCELLANEOUS) IMPLANT
SOL PREP PVP 2OZ (MISCELLANEOUS)
SOLUTION PREP PVP 2OZ (MISCELLANEOUS) IMPLANT
SPONGE T-LAP 18X18 ~~LOC~~+RFID (SPONGE) IMPLANT
SUT ETHILON 3-0 FS-10 30 BLK (SUTURE)
SUT MNCRL+ 5-0 UNDYED PC-3 (SUTURE) IMPLANT
SUT VIC AB 0 CT1 36 (SUTURE) IMPLANT
SUT VIC AB 2-0 CT1 (SUTURE) IMPLANT
SUT VIC AB 2-0 SH 27XBRD (SUTURE) ×1 IMPLANT
SUT VIC AB 3-0 SH 27X BRD (SUTURE) ×1 IMPLANT
SUTURE EHLN 3-0 FS-10 30 BLK (SUTURE) IMPLANT
SWAB CULTURE AMIES ANAERIB BLU (MISCELLANEOUS) ×1 IMPLANT
SYR 20ML LL LF (SYRINGE) IMPLANT
SYR BULB IRRIG 60ML STRL (SYRINGE) ×1 IMPLANT
TRAP FLUID SMOKE EVACUATOR (MISCELLANEOUS) ×1 IMPLANT
WATER STERILE IRR 500ML POUR (IV SOLUTION) ×1 IMPLANT

## 2023-06-24 NOTE — Op Note (Addendum)
OPERATIVE NOTE   PROCEDURE: Excisional debridement of left groin abscess. Rotation of a sartorius muscle flap for coverage of the existing bypass grafts. Placement of a nondisposable wound VAC dressing left groin. Redo vascular surgery  PRE-OPERATIVE DIAGNOSIS: Left groin abscess associated with a left femoral-popliteal bypass with PTFE  POST-OPERATIVE DIAGNOSIS: Same  CO-SURGEONS: [name], MD and [name], MD  ASSISTANT(S): none  ANESTHESIA: general  ESTIMATED BLOOD LOSS: 50 cc  FINDING(S): Seroma cavity with some necrotic muscle and tendon and exposed PTFE bypass graft.  Large amount of fluid noted slightly murky in nature.  SPECIMEN(S): Fluid aspirated and sent for Gram stain and culture.  A deep wound culture was also made.  Debrided tissue was not sent.  INDICATIONS:   Laura Reed is a 62 y.o. female who presents with increasing pain in her left lower extremity associated with shaking chills and myalgias.  Workup including a CT demonstrated a loculated fluid collection with gas bubbles consistent with an abscess.  Given her prosthetic bypasses incision and drainage with coverage of the bypasses has been recommended.  Risks and benefits have been reviewed all questions answered patient has agreed to proceed..  DESCRIPTION: After full informed written consent was obtained from the patient, the patient was brought back to the operating room and placed supine upon the operating table.  Prior to induction, the patient received IV antibiotics.   After obtaining adequate anesthesia, the patient was then prepped and draped in the standard fashion for a exploration of the left groin.    Co-surgeons are required because this is a complex bilateral procedure with work being performed simultaneously from both the right femoral and left femoral approach.  This also expedites the procedure making a shorter operative time reducing complications and improving patient  safety.  With Dr. Wyn Quaker working on the patient's right-hand side and myself working on the patient's left-hand side a small incision was made in the midportion of the existing scar and a 10 cc syringe was inserted and approximately 6 cc of murky fluid was aspirated.  This was then sent to microbiology for a stat Gram stain as well as culture.  Using a 10 blade scalpel the previous incisional scar was reopened extending the incision approximately 3 cm proximally and 2 cm distally.  Using wheat Lander's the wound was then fully exposed.  The remaining murky fluid was then aspirated out.  Using a 15 blade scalpel as well as Metzenbaum scissors and Bovie cautery devitalized tissue which included muscle tendon as well as skin and soft tissue was then debrided.  The wound was then irrigated with 3 L of GU irrigant.  Hemostasis was then achieved with Bovie cautery.  Attention was then turned to the sartorius muscle which was identified.  By extending the incision proximally I was able that expose the muscle all the way up to its origin on the iliac crest.  With Dr. Driscilla Grammes assistance as a co-surgeon the sartorius flap was then mobilized circumferentially.  The sartorius muscle was then rotated over covering the PTFE prosthetic in its entirety.  It was then secured on its lateral border with interrupted 2-0 Vicryl mattress sutures.  Antibiotic beads with gentamicin and vancomycin were then reconstituted on the back table and placed in the bed of the wound all along the prosthetic.  The medial side of the sartorius muscle was then tacked to the deep tissues medially with interrupted 2-0 Vicryl sutures.  The dead space in the bed of the  sartorius muscle was then closed using interrupted 3-0 Vicryl sutures.  The wound was then irrigated again with some saline.  The wound was then measured and is 22 cm in length by 11 cm in width and 3-1/2 cm in depth.  This is equal to 242 cm of surface area.  A nondisposable VAC  dressing was then opened onto the field the mediums black sponge was used.  Was trimmed to the appropriate length and then using Ioban to cover the sponge appropriate seal was obtained.  VAC was then connected and an excellent seal was noted prior to leaving the OR.   The patient tolerated this procedure well.   COMPLICATIONS: None  CONDITION: [name]  Office: [phone]   06/24/2023, 3:27 PM

## 2023-06-24 NOTE — Anesthesia Preprocedure Evaluation (Signed)
Anesthesia Evaluation  Patient identified by MRN, date of birth, ID band Patient awake    Reviewed: Allergy & Precautions, NPO status , Patient's Chart, lab work & pertinent test results  History of Anesthesia Complications Negative for: history of anesthetic complications  Airway Mallampati: III  TM Distance: >3 FB Neck ROM: full    Dental  (+) Upper Dentures, Lower Dentures, Dental Advidsory Given   Pulmonary neg COPD, Recent URI , Residual Cough, former smoker   Pulmonary exam normal        Cardiovascular hypertension, On Medications (-) angina + CAD, + CABG, + Peripheral Vascular Disease and +CHF  (-) Cardiac Stents + dysrhythmias (rate controlled) Atrial Fibrillation + Valvular Problems/Murmurs      Neuro/Psych neg Seizures PSYCHIATRIC DISORDERS Anxiety      Neuromuscular disease    GI/Hepatic Neg liver ROS,GERD  Controlled,,  Endo/Other  diabetes, Insulin Dependent    Renal/GU Renal disease     Musculoskeletal  (+) Arthritis ,    Abdominal   Peds  Hematology negative hematology ROS (+)   Anesthesia Other Findings Past Medical History: No date: Absence of kidney     Comment:  left No date: Anxiety No date: Arthritis No date: Bladder cancer (HCC) No date: CHF (congestive heart failure) (HCC) No date: Complication of anesthesia     Comment:  BP HAS  RUN LOW AFTER SURGERY-LUNGS FILLED UP WITH FLUID              AFTER  LEG STENT SURGERY  No date: Coronary artery disease No date: Diabetes mellitus No date: Family history of adverse reaction to anesthesia     Comment:  Sister - PONV No date: GERD (gastroesophageal reflux disease)     Comment:  OCC TUMS No date: Heart murmur No date: Hemorrhoid 2007: History of methicillin resistant staphylococcus aureus (MRSA) No date: Hypertension No date: Neuropathy No date: PVD (peripheral vascular disease) (HCC) No date: Thyroid nodule     Comment:   right 10/31/2014: Urothelial carcinoma of kidney (HCC)     Comment:  INVASIVE UROTHELIAL CARCINOMA, LOW GRADE. T1, Nx. No date: Wears dentures     Comment:  full upper and lower  Past Surgical History: No date: AMPUTATION TOE     Comment:  right (4th and 5th); left (great toe, 3rd) 07/16/2018: AMPUTATION TOE; Right     Comment:  Procedure: AMPUTATION TOE/MPJ right 2nd;  Surgeon:               Linus Galas, DPM;  Location: [facility];  Service:               Podiatry;  Laterality: Right; 2009, 2013 x 2: ARTERIAL BYPASS SURGRY     Comment:  right leg , done in [place] No date: CARDIAC CATHETERIZATION 01/2014: CAROTID ENDARTERECTOMY; Right     Comment:  Dr Gilda Crease 12/14/2014: CATARACT EXTRACTION W/PHACO; Right     Comment:  Procedure: CATARACT EXTRACTION PHACO AND INTRAOCULAR               LENS PLACEMENT (IOC);  Surgeon: Lia Hopping, MD;                Location: [facility];  Service: Ophthalmology;  Laterality:              Right;  [place]   00:38.6              AP        7.1  CDE  2.76 12/06/2019: CATARACT EXTRACTION W/PHACO; Left     Comment:  Procedure: CATARACT EXTRACTION PHACO AND INTRAOCULAR               LENS PLACEMENT (IOC) LEFT DIABETIC;  Surgeon: Galen Manila, MD;  Location: [facility];  Service:               Ophthalmology;  Laterality: Left;  9.08 1:06.4 No date: CESAREAN SECTION 03-03-12: CHOLECYSTECTOMY     Comment:  Porcelain gallbladder, gallstones,  [place] 04/28/2012: COLONOSCOPY W/ BIOPSIES     Comment:  Hyperplastic rectal polyps. 04/02/2022: COLONOSCOPY WITH PROPOFOL; N/A     Comment:  Procedure: COLONOSCOPY WITH PROPOFOL;  Surgeon: Earline Mayotte, MD;  Location: [facility];  Service:               Endoscopy;  Laterality: N/A; 2009: CORONARY ARTERY BYPASS GRAFT     Comment:  3 vessel 09/01/2016: CYSTOSCOPY W/ RETROGRADES; Right     Comment:  Procedure: CYSTOSCOPY WITH RETROGRADE PYELOGRAM;                 Surgeon: Vanna Scotland, MD;  Location: [facility];                Service: Urology;  Laterality: Right; 03/19/2020: CYSTOSCOPY W/ RETROGRADES; Bilateral     Comment:  Procedure: CYSTOSCOPY WITH RETROGRADE PYELOGRAM;                Surgeon: Vanna Scotland, MD;  Location: [facility];                Service: Urology;  Laterality: Bilateral; 03/19/2020: CYSTOSCOPY WITH BIOPSY; N/A     Comment:  Procedure: CYSTOSCOPY WITH BIOPSY;  Surgeon: Vanna Scotland, MD;  Location: [facility];  Service: Urology;                Laterality: N/A; No date: EYE SURGERY 10-31-14: HERNIA REPAIR     Comment:  ventral, retro-rectus atrium mesh 01/18/2019: IRRIGATION AND DEBRIDEMENT FOOT; Left     Comment:  Procedure: IRRIGATION AND DEBRIDEMENT FOOT;  Surgeon:               Linus Galas, DPM;  Location: [facility];  Service:               Podiatry;  Laterality: Left; 12/10/2016: LOWER EXTREMITY ANGIOGRAPHY; Left     Comment:  Procedure: Lower Extremity Angiography;  Surgeon:               Renford Dills, MD;  Location: [facility];               Service: Cardiovascular;  Laterality: Left; 02/02/2018: LOWER EXTREMITY ANGIOGRAPHY; Left     Comment:  Procedure: LOWER EXTREMITY ANGIOGRAPHY;  Surgeon:               Renford Dills, MD;  Location: [facility];               Service: Cardiovascular;  Laterality: Left; 05/05/2018: LOWER EXTREMITY ANGIOGRAPHY; Left     Comment:  Procedure: LOWER EXTREMITY ANGIOGRAPHY;  Surgeon:               Renford Dills, MD;  Location: [facility];               Service: Cardiovascular;  Laterality: Left; 12/04/2020: LOWER EXTREMITY ANGIOGRAPHY; Left     Comment:  Procedure: LOWER EXTREMITY ANGIOGRAPHY with               Intervention;  Surgeon: Renford Dills, MD;                Location: [facility];  Service: Cardiovascular;              Laterality: Left; 04/24/2023: LOWER EXTREMITY ANGIOGRAPHY; Left     Comment:  Procedure:  Lower Extremity Angiography;  Surgeon:               Renford Dills, MD;  Location: [facility];               Service: Cardiovascular;  Laterality: Left; 10-31-14: NEPHRECTOMY; Left 05/01/2015: PERIPHERAL VASCULAR CATHETERIZATION; Left     Comment:  Procedure: Lower Extremity Angiography;  Surgeon:               Renford Dills, MD;  Location: [facility];                Service: Cardiovascular;  Laterality: Left; 05/01/2015: PERIPHERAL VASCULAR CATHETERIZATION     Comment:  Procedure: Lower Extremity Intervention;  Surgeon:               Renford Dills, MD;  Location: [facility];                Service: Cardiovascular;; 02/20/2015: PERIPHERAL VASCULAR CATHETERIZATION; Left     Comment:  Procedure: Pelvic Angiography;  Surgeon: Renford Dills, MD;  Location: [facility];  Service:               Cardiovascular;  Laterality: Left; 09/01/2016: TRANSURETHRAL RESECTION OF BLADDER TUMOR WITH MITOMYCIN-C;  N/A     Comment:  Procedure: TRANSURETHRAL RESECTION OF BLADDER TUMOR WITH              MITOMYCIN-C;  Surgeon: Vanna Scotland, MD;  Location:               [facility];  Service: Urology;  Laterality: N/A;  BMI    Body Mass Index: 21.13 kg/m      Reproductive/Obstetrics negative OB ROS                             Anesthesia Physical Anesthesia Plan  ASA: 3  Anesthesia Plan: General   Post-op Pain Management: Ofirmev IV (intra-op)*   Induction: Intravenous  PONV Risk Score and Plan: 3 and Treatment may vary due to age or medical condition, Ondansetron, Dexamethasone and Midazolam  Airway Management Planned: Oral ETT and LMA  Additional Equipment:   Intra-op Plan:   Post-operative Plan: Extubation in OR  Informed Consent: I have reviewed the patients History and Physical, chart, labs and discussed the procedure including the risks, benefits and alternatives for the proposed anesthesia with  the patient or authorized representative who has indicated his/her understanding and acceptance.     Dental Advisory Given  Plan Discussed with: Anesthesiologist, CRNA and Surgeon  Anesthesia Plan Comments:         Anesthesia Quick Evaluation

## 2023-06-24 NOTE — Anesthesia Procedure Notes (Signed)
Procedure Name: Intubation Date/Time: 06/24/2023 12:09 PM  Performed by: Monico Hoar, CRNAPre-anesthesia Checklist: Patient identified, Patient being monitored, Timeout performed, Emergency Drugs available and Suction available Patient Re-evaluated:Patient Re-evaluated prior to induction Oxygen Delivery Method: Circle system utilized Preoxygenation: Pre-oxygenation with 100% oxygen Induction Type: IV induction Ventilation: Mask ventilation without difficulty Laryngoscope Size: Mac and 4 Grade View: Grade I Tube type: Oral Tube size: 7.0 mm Number of attempts: 1 Airway Equipment and Method: Stylet Placement Confirmation: ETT inserted through vocal cords under direct vision, positive ETCO2 and breath sounds checked- equal and bilateral Secured at: 21 cm Tube secured with: Tape Dental Injury: Teeth and Oropharynx as per pre-operative assessment

## 2023-06-24 NOTE — Transfer of Care (Signed)
Immediate Anesthesia Transfer of Care Note  Patient: Laura Reed  Procedure(s) Performed: INCISION AND DRAINAGE WITH SATORIUS FLAP (Left)  Patient Location: PACU  Anesthesia Type:General  Level of Consciousness: awake and alert   Airway & Oxygen Therapy: Patient Spontanous Breathing and Patient connected to face mask oxygen  Post-op Assessment: Report given to RN and Post -op Vital signs reviewed and stable  Post vital signs: Reviewed and stable  Last Vitals:  Vitals Value Taken Time  BP 91/60 06/24/23 1350  Temp    Pulse 76 06/24/23 1354  Resp 15 06/24/23 1354  SpO2 100 % 06/24/23 1354  Vitals shown include unfiled device data.  Last Pain:  Vitals:   06/24/23 0710  TempSrc:   PainSc: 0-No pain      Patients Stated Pain Goal: 3 (06/23/23 2137)  Complications: No notable events documented.

## 2023-06-24 NOTE — Interval H&P Note (Signed)
History and Physical Interval Note:  06/24/2023 12:05 PM  Laura Reed  has presented today for surgery, with the diagnosis of Groin Infection.  The various methods of treatment have been discussed with the patient and family. After consideration of risks, benefits and other options for treatment, the patient has consented to  Procedure(s): INCISION AND DRAINAGE WITH SATORIUS FLAP (Left) as a surgical intervention.  The patient's history has been reviewed, patient examined, no change in status, stable for surgery.  I have reviewed the patient's chart and labs.  Questions were answered to the patient's satisfaction.     [name]

## 2023-06-24 NOTE — Progress Notes (Signed)
NAME:  Laura Reed, MRN:  [id number], DOB:  08-Apr-1961, LOS: 3 ADMISSION DATE:  06/21/2023, CONSULTATION DATE:  06/22/23 REFERRING MD: Lorretta Harp , CHIEF COMPLAINT:  Hypotension    HPI  62 y.o female with significant PMH of HTN, HLD, DM, sCHF, CAD, PAD (s/p of left common femoral-distal bypass, thrombectomy, angioplasty and stent placement 04/2023 on aspirin, Plavix and Eliquis), anxiety, bladder cancer (s/p of nephrectomy) who presented to the ED with chief complaints of   Patient presented to [place] urgent care on 06/21/23 with complaints of right groin pain, redness and oozing post op. Patient has hx of PAD and recently underwent left common femoral-distal bypass, thrombectomy, angioplasty and stent placement 04/2023  and right transmetatarsal amputation. States that she has been having left leg swelling. No weeping. Some pitting. She thinks she has been having fever. She was noted to be hypotensive with SBP in the 50s and was sent to the ED for further evaluation.   ED Course: Initial vital signs showed HR of 72 beats/minute, BP 136/86 mm Hg, the RR 19 breaths/minute, and the oxygen saturation 96 % on RA and a temperature of 98.20F (36.7C). . Pertinent Labs/Diagnostics Findings: Na+/ K+: 124/4.2 Glucose: 2537 BUN/Cr.:39/1.29 Calcium:  AST/ALT: WBC: 18.8K/L, Hgb/Hct: 10.6/30.7   PCT: 12.39 Lactic acid:1.1  BNP: 557.6  Imaging:Left leg arterial Duplex: US DVT Left Medication administered in the ED: Vanc and Zosyn, IVFs bolus Disposition:Admitted to stepdown under [facility] service  Past Medical History  HTN, HLD, DM, sCHF, CAD, PAD (s/p of left common femoral-distal bypass, thrombectomy, angioplasty and stent placement 04/2023 on aspirin, Plavix and Eliquis), anxiety, bladder cancer (s/p of nephrectomy)  Significant Hospital Events   12/15: Admitted to [facility] service with sepsis secondary to suspected infected groin site 12/16: Remained hypotensive despite IV fluid resuscitation requiring  vasopressors.  PCCM consulted 06/23/23- patient more stable , s/p vascular surg eval plan for OR in AM 06/24/23- patient on neosyneprhine, for OR today.  S/p ID eval on daptomycin/cefepime, drained 200cc pus from L groin wound  Consults:  PCCM Vascular  Procedures:  None  Significant Diagnostic Tests:  12/15: Left leg arterial Duplex: 10.6 cm complex collection presumably hematoma overlying the common femoral vessels, without associated pseudoaneurysm, actives/p extravasation, or AV fistula.  12/15: US DVT Left IMPRESSION: No evidence of DVT within the left lower extremity.  12/16: CTA Chest, abdomen and pelvis> IMPRESSION: 1. 8 cm enhancing fluid collection with air surrounding the left common and superficial femoral artery extending to the skin surface. Findings are worrisome for abscess. 2. Wedge-shaped area of hypodensity within the spleen suspicious for infarct. 3. Left nephrectomy changes. 4. Right nonobstructing nephrolithiasis. 5. Stable left adrenal nodule previously characterized as adenoma. 6. Colonic diverticulosis. 7. Well-circumscribed distal presacral mass measuring 4.0 cm is indeterminate. Findings may represent a complex cyst, but other etiologies are not excluded. 8. Bilateral common and external iliac artery stents are present. Can not confirm flow within the stents. There is flow in the aorta. Fem-fem bypass graft appears grossly patent. 9. Aortic atherosclerosis.  Interim History / Subjective:      Micro Data:  12/15: Blood culture x2> 12/16: MRSA PCR>>   Antimicrobials:  Vancomycin 12/15>> Cefepime 12/15>>  OBJECTIVE  Blood pressure 97/80, pulse (!) 116, temperature 98.1 F (36.7 C), temperature source Oral, resp. rate 13, height 5\' 7"  (1.702 m), weight 64.1 kg, SpO2 98%.        Intake/Output Summary (Last 24 hours) at 06/24/2023 1105 Last data filed at 06/24/2023  1191 Gross per 24 hour  Intake 2880.78 ml  Output 1100 ml  Net  1780.78 ml   Filed Weights   06/21/23 1348  Weight: 64.1 kg   Physical Examination  GENERAL: 62 year-old female critically ill patient lying in the bed  EYES: PEERLA. No scleral icterus. Extraocular muscles intact.  HEENT: Head atraumatic, normocephalic. Oropharynx and nasopharynx clear.  NECK:  No JVD, supple  LUNGS: Normal breath sounds bilaterally.  No use of accessory muscles of respiration.  CARDIOVASCULAR: S1, S2 normal. Murmur present, rubs, or gallops.  ABDOMEN: Soft, NTND EXTREMITIES: Left lower extremity swelling without erythema.  Capillary refill < 3 seconds in all extremities. Pulses palpable distally.transmetatarsal amputation of left foot  NEUROLOGIC: No focal neurological deficit appreciated. Cranial nerves are intact.  SKIN: No obvious rash, lesion, or ulcer. Warm to touch  Labs/imaging that I havepersonally reviewed  (right click and "Reselect all SmartList Selections" daily)     Labs   CBC: Recent Labs  Lab 06/21/23 1351 06/22/23 0055 06/22/23 0458 06/23/23 0656 06/24/23 0445  WBC 18.8* 10.8* 14.9* 15.5* 18.7*  HGB 10.6* 9.4* 9.8* 10.5* 11.2*  HCT 30.7* 27.8* 29.2* 30.5* 33.1*  MCV 86.7 89.7 89.3 89.7 87.8  PLT 234 201 238 267 326    Basic Metabolic Panel: Recent Labs  Lab 06/21/23 1351 06/21/23 2304 06/22/23 0458 06/23/23 0656 06/24/23 0017 06/24/23 0445  NA 124* 128* 126* 130*  --  132*  K 4.2 4.2 4.2 3.6 3.5 3.6  CL 93* 98 99 105  --  104  CO2 24 25 19* 17*  --  21*  GLUCOSE 237* 198* 258* 136*  --  253*  BUN 39* 39* 37* 25*  --  19  CREATININE 1.29* 1.14* 1.24* 0.88  --  0.72  CALCIUM 8.0* 7.7* 7.5* 7.1*  --  7.5*  MG 2.6*  --   --   --  1.9 2.0  PHOS 2.3*  --  3.9  --  2.0* 2.5   GFR: Estimated Creatinine Clearance: 69.1 mL/min (by C-G formula based on SCr of 0.72 mg/dL). Recent Labs  Lab 06/21/23 1351 06/21/23 1635 06/22/23 0055 06/22/23 0458 06/23/23 0656 06/24/23 0445  PROCALCITON 12.39  --   --   --   --   --   WBC 18.8*   --  10.8* 14.9* 15.5* 18.7*  LATICACIDVEN  --  1.1  --   --   --   --     Liver Function Tests: Recent Labs  Lab 06/22/23 0458 06/23/23 0656 06/24/23 0445  AST 24 24 16   ALT 16 15 14   ALKPHOS 105 85 113  BILITOT 0.6 0.7 0.4  PROT 5.4* 5.0* 5.5*  ALBUMIN 2.4* 2.1* 2.2*   No results for input(s): "LIPASE", "AMYLASE" in the last 168 hours. No results for input(s): "AMMONIA" in the last 168 hours.  ABG    Component Value Date/Time   PHART 7.36 11/01/2015 1716   PCO2ART 35 11/01/2015 1716   PO2ART 104 11/01/2015 1716   HCO3 19.8 (L) 11/01/2015 1716   ACIDBASEDEF 4.9 (H) 11/01/2015 1716   O2SAT 97.8 11/01/2015 1716     Coagulation Profile: Recent Labs  Lab 06/21/23 2304  INR 1.1    Cardiac Enzymes: Recent Labs  Lab 06/24/23 0445  CKTOTAL 34*    HbA1C: Hemoglobin A1C  Date/Time Value Ref Range Status  08/16/2013 03:39 PM 8.0 (H) 4.2 - 6.3 % Final    Comment:    The American Diabetes Association recommends  that a primary goal of therapy should be <7% and that physicians should reevaluate the treatment regimen in patients with HbA1c values consistently >8%.   07/21/2011 04:20 AM 11.2 (H) 4.2 - 6.3 % Final    Comment:    The American Diabetes Association recommends that a primary goal of therapy should be <7% and that physicians should reevaluate the treatment regimen in patients with HbA1c values consistently >8%.    Hgb A1c MFr Bld  Date/Time Value Ref Range Status  01/27/2023 11:34 AM 8.0 (H) 4.6 - 6.5 % Final    Comment:    Glycemic Control Guidelines for People with Diabetes:Non Diabetic:  <6%Goal of Therapy: <7%Additional Action Suggested:  >8%   09/08/2022 10:44 AM 7.5 (H) 4.6 - 6.5 % Final    Comment:    Glycemic Control Guidelines for People with Diabetes:Non Diabetic:  <6%Goal of Therapy: <7%Additional Action Suggested:  >8%     CBG: Recent Labs  Lab 06/23/23 1146 06/23/23 1610 06/23/23 1841 06/23/23 1959 06/24/23 0820  GLUCAP 233* 227*  239* 225* 241*    Review of Systems:    10 point ROS done and is negative except as per HPI  Past Medical History  She,  has a past medical history of Absence of kidney, Anxiety, Arthritis, Atherosclerosis of native arteries of extremities with intermittent claudication, bilateral legs (HCC), Bladder cancer (HCC), CHF (congestive heart failure) (HCC), Complication of anesthesia, Coronary artery disease, Diabetes mellitus, Family history of adverse reaction to anesthesia, GERD (gastroesophageal reflux disease), Heart murmur, Hemorrhoid, History of methicillin resistant staphylococcus aureus (MRSA) (2007), Hypertension, Neuropathy, PVD (peripheral vascular disease) (HCC), Thyroid nodule, Unspecified osteoarthritis, unspecified site, Urothelial carcinoma of kidney (HCC) (10/31/2014), Vitamin D deficiency, unspecified, and Wears dentures.   Surgical History    Past Surgical History:  Procedure Laterality Date   AMPUTATION TOE     right (4th and 5th); left (great toe, 3rd)   AMPUTATION TOE Right 07/16/2018   Procedure: AMPUTATION TOE/MPJ right 2nd;  Surgeon: Linus Galas, DPM;  Location: [facility];  Service: Podiatry;  Laterality: Right;   ARTERIAL BYPASS SURGRY  2009, 2013 x 2   right leg , done in [place]   CARDIAC CATHETERIZATION     CAROTID ENDARTERECTOMY Right 01/2014   Dr Gilda Crease   CATARACT EXTRACTION W/PHACO Right 12/14/2014   Procedure: CATARACT EXTRACTION PHACO AND INTRAOCULAR LENS PLACEMENT (IOC);  Surgeon: Lia Hopping, MD;  Location: [facility];  Service: Ophthalmology;  Laterality: Right;  [place]   00:38.6              AP        7.1                   CDE  2.76   CATARACT EXTRACTION W/PHACO Left 12/06/2019   Procedure: CATARACT EXTRACTION PHACO AND INTRAOCULAR LENS PLACEMENT (IOC) LEFT DIABETIC;  Surgeon: Galen Manila, MD;  Location: [facility];  Service: Ophthalmology;  Laterality: Left;  9.08 1:06.4   CESAREAN SECTION     CHOLECYSTECTOMY  03-03-12   Porcelain gallbladder,  gallstones,  [place]   COLONOSCOPY W/ BIOPSIES  04/28/2012   Hyperplastic rectal polyps.   COLONOSCOPY WITH PROPOFOL N/A 04/02/2022   Procedure: COLONOSCOPY WITH PROPOFOL;  Surgeon: Earline Mayotte, MD;  Location: [facility];  Service: Endoscopy;  Laterality: N/A;   CORONARY ARTERY BYPASS GRAFT  2009   3 vessel   CYSTOSCOPY W/ RETROGRADES Right 09/01/2016   Procedure: CYSTOSCOPY WITH RETROGRADE PYELOGRAM;  Surgeon: Vanna Scotland, MD;  Location: [facility];  Service: Urology;  Laterality: Right;   CYSTOSCOPY W/ RETROGRADES Bilateral 03/19/2020   Procedure: CYSTOSCOPY WITH RETROGRADE PYELOGRAM;  Surgeon: Vanna Scotland, MD;  Location: [facility];  Service: Urology;  Laterality: Bilateral;   CYSTOSCOPY WITH BIOPSY N/A 03/19/2020   Procedure: CYSTOSCOPY WITH BIOPSY;  Surgeon: Vanna Scotland, MD;  Location: [facility];  Service: Urology;  Laterality: N/A;   EYE SURGERY     FEMORAL-POPLITEAL BYPASS GRAFT Left 04/29/2023   Procedure: BYPASS GRAFT FEMORAL-POPLITEAL ARTERY;  Surgeon: Renford Dills, MD;  Location: [facility];  Service: Vascular;  Laterality: Left;   HERNIA REPAIR  10-31-14   ventral, retro-rectus atrium mesh   IRRIGATION AND DEBRIDEMENT FOOT Left 01/18/2019   Procedure: IRRIGATION AND DEBRIDEMENT FOOT;  Surgeon: Linus Galas, DPM;  Location: [facility];  Service: Podiatry;  Laterality: Left;   LOWER EXTREMITY ANGIOGRAPHY Left 12/10/2016   Procedure: Lower Extremity Angiography;  Surgeon: Renford Dills, MD;  Location: [facility];  Service: Cardiovascular;  Laterality: Left;   LOWER EXTREMITY ANGIOGRAPHY Left 02/02/2018   Procedure: LOWER EXTREMITY ANGIOGRAPHY;  Surgeon: Renford Dills, MD;  Location: [facility];  Service: Cardiovascular;  Laterality: Left;   LOWER EXTREMITY ANGIOGRAPHY Left 05/05/2018   Procedure: LOWER EXTREMITY ANGIOGRAPHY;  Surgeon: Renford Dills, MD;  Location: [facility];  Service: Cardiovascular;  Laterality: Left;   LOWER  EXTREMITY ANGIOGRAPHY Left 12/04/2020   Procedure: LOWER EXTREMITY ANGIOGRAPHY with Intervention;  Surgeon: Renford Dills, MD;  Location: [facility];  Service: Cardiovascular;  Laterality: Left;   LOWER EXTREMITY ANGIOGRAPHY Left 04/24/2023   Procedure: Lower Extremity Angiography;  Surgeon: Renford Dills, MD;  Location: [facility];  Service: Cardiovascular;  Laterality: Left;   LOWER EXTREMITY ANGIOGRAPHY Left 05/01/2023   Procedure: Lower Extremity Angiography;  Surgeon: Renford Dills, MD;  Location: [facility];  Service: Cardiovascular;  Laterality: Left;   NEPHRECTOMY Left 10-31-14   PERIPHERAL VASCULAR CATHETERIZATION Left 05/01/2015   Procedure: Lower Extremity Angiography;  Surgeon: Renford Dills, MD;  Location: [facility];  Service: Cardiovascular;  Laterality: Left;   PERIPHERAL VASCULAR CATHETERIZATION  05/01/2015   Procedure: Lower Extremity Intervention;  Surgeon: Renford Dills, MD;  Location: [facility];  Service: Cardiovascular;;   PERIPHERAL VASCULAR CATHETERIZATION Left 02/20/2015   Procedure: Pelvic Angiography;  Surgeon: Renford Dills, MD;  Location: [facility];  Service: Cardiovascular;  Laterality: Left;   TRANSMETATARSAL AMPUTATION Left 05/05/2023   Procedure: TRANSMETATARSAL AMPUTATION LEFT;  Surgeon: Gwyneth Revels, DPM;  Location: [facility];  Service: Orthopedics/Podiatry;  Laterality: Left;   TRANSURETHRAL RESECTION OF BLADDER TUMOR WITH MITOMYCIN-C N/A 09/01/2016   Procedure: TRANSURETHRAL RESECTION OF BLADDER TUMOR WITH MITOMYCIN-C;  Surgeon: Vanna Scotland, MD;  Location: [facility];  Service: Urology;  Laterality: N/A;     Social History   reports that she quit smoking about 10 years ago. Her smoking use included cigarettes. She started smoking about 45 years ago. She has a 70 pack-year smoking history. She has never used smokeless tobacco. She reports that she does not currently use  alcohol. She reports that she does not currently use drugs after having used the following drugs: Cocaine.   Family History   Her family history includes Breast cancer in her maternal grandmother; Cancer (age of onset: 79) in her mother; Cancer (age of onset: 66) in her father; Diabetes in her son. There is no history of Kidney  cancer, Bladder Cancer, or Prostate cancer.   Allergies No Known Allergies   Home Medications  Prior to Admission medications   Medication Sig Start Date End Date Taking? Authorizing Provider  acetaminophen (TYLENOL) 500 MG tablet Take 1,000 mg by mouth every 6 (six) hours as needed for mild pain or headache.    Yes [provider]  apixaban (ELIQUIS) 2.5 MG TABS tablet Take 1 tablet (2.5 mg total) by mouth 2 (two) times daily. 05/11/23  Yes Pace, Huel Cote R, NP  aspirin 81 MG chewable tablet Chew 81 mg by mouth at bedtime.    Yes [provider]  Cholecalciferol (VITAMIN D3) 2000 UNITS TABS Take 2,000 Units by mouth daily after supper.    Yes [provider]  clopidogrel (PLAVIX) 75 MG tablet Take 1 tablet (75 mg total) by mouth daily at 6 (six) AM. 05/12/23  Yes Pace, Brien R, NP  empagliflozin (JARDIANCE) 25 MG TABS tablet TAKE 1 TABLET(25 MG) BY MOUTH DAILY BEFORE AND BREAKFAST 09/08/22  Yes Sherlene Shams, MD  gabapentin (NEURONTIN) 300 MG capsule Take 300 mg by mouth 2 (two) times daily.   Yes [provider]  insulin degludec (TRESIBA FLEXTOUCH) 100 UNIT/ML FlexTouch Pen Inject 20 Units into the skin daily. Patient taking differently: Inject 22 Units into the skin daily. 09/08/22  Yes Sherlene Shams, MD  insulin lispro (HUMALOG KWIKPEN) 100 UNIT/ML KwikPen ADMINISTER 10 UNITS UNDER THE SKIN THREE TIMES DAILY BEFORE MEALS Patient taking differently: ADMINISTER 10 UNITS UNDER THE SKIN THREE TIMES DAILY BEFORE MEALS PRN 12/28/21  Yes Worthy Rancher B, FNP  losartan (COZAAR) 50 MG tablet Take 50 mg by mouth daily.   Yes [provider]  Multiple Vitamin (MULTIVITAMIN) tablet Take 1 tablet by mouth daily.   Yes [provider]  triamcinolone ointment (KENALOG) 0.5 % Apply 1 Application topically 2 (two) times daily. 01/27/23  Yes Sherlene Shams, MD  ALPRAZolam Prudy Feeler) 0.5 MG tablet Take 1 tablet (0.5 mg total) by mouth 2 (two) times daily as needed for anxiety. Patient not taking: Reported on 06/21/2023 05/11/23   Marcie Bal, NP  atorvastatin (LIPITOR) 20 MG tablet TAKE 1 TABLET(20 MG) BY MOUTH DAILY 01/22/23   Sherlene Shams, MD  Continuous Glucose Sensor (FREESTYLE LIBRE 2 SENSOR) MISC Use to check sugar at least 4 times daily 05/05/23   Sherlene Shams, MD  glucose blood test strip Use you check blood sugars up to four times daily. 03/25/22   Sherlene Shams, MD  Insulin Pen Needle (PEN NEEDLES) 32G X 4 MM MISC Use to take insulin daily 03/31/23   Sherlene Shams, MD  multivitamin-lutein [facility]) CAPS capsule Take 1 capsule by mouth daily. Patient not taking: Reported on 06/21/2023    [provider]  Scheduled Meds:  aspirin  81 mg Oral QHS   atorvastatin  20 mg Oral Daily   [START ON 06/25/2023] chlorhexidine  60 mL Topical Once   Chlorhexidine Gluconate Cloth  6 each Topical Daily   cholecalciferol  2,000 Units Oral Daily   gabapentin  300 mg Oral BID   insulin aspart  0-5 Units Subcutaneous QHS   insulin aspart  0-9 Units Subcutaneous TID WC   insulin glargine-yfgn  14 Units Subcutaneous QHS   midodrine  10 mg Oral TID WC   multivitamin with minerals  1 tablet Oral Daily   sodium chloride  1 g Oral BID WC   Continuous Infusions:  sodium chloride 75 mL/hr  at 06/24/23 0657   sodium chloride     amiodarone 60 mg/hr (06/24/23 0657)   amiodarone     amiodarone      ceFAZolin (ANCEF) IV     ceFEPime (MAXIPIME) IV 2 g (06/24/23 1000)   DAPTOmycin     phenylephrine (NEO-SYNEPHRINE) Adult infusion 120 mcg/min (06/24/23 1041)   PRN Meds:.acetaminophen, ALPRAZolam, amiodarone,  hydrALAZINE, ondansetron (ZOFRAN) IV, oxyCODONE-acetaminophen   Active Hospital Problem list   See systems below  Assessment & Plan:  #Sepsis with septic shock  #Right groin abscess #?Infectious diarrhea  Patient presenting with left leg pain and diarrhea.  CT abd/pelvis concerning for an abscess surrounding the left common and superficial femoral artery the possible splenic infarct  -F/u cultures, trend lactic/ PCT -GI panel and C. difficile pending -Monitor WBC/ fever curve -Continue Zosyn & vancomycin -IVF hydration as needed -Pressors for MAP goal >65 -Strict I/O's  #PAD s/p of left common femoral-distal bypass, thrombectomy, angioplasty and stent placement 04/2023  on aspirin, Plavix and Eliquis   -[place] negative for DVT.  Left leg arterial duplex showed 10.6 cm complex collection concerning for seroma versus hematoma -Follow-up CT abdomen/pelvis concerning for abscess and possible splenic infarct. Discussed with vascular surgeon Dr. Wallace Cullens will evaluate in the morning -Continue aspirin Plavix -Hold Eliquis and continue IV heparin   #Acute Kidney Injury likely ATN #Hyponatremia PMHx: bladder cancer (s/p of nephrectomy  -Monitor I&O's / urinary output -Follow BMP -Check TSH and free T4 -Ensure adequate renal perfusion -Avoid nephrotoxic agents as able -Replace electrolytes as indicated   #Chronic dCHF PMHx: HTN, HLD Echo 12/2021: normal LVEF  -BNP>557.6, no evidence of volume overload -Hold GDMT for for now -Continue atorvastatin, aspirin and Plavix -Obtain 2D echocardiogram  #T2DM -Check hemoglobin A1c -CBG's q4; Target range of 140 to 180 -SSI -Start basal insulin once able to tolerate p.o. -Follow ICU Hypo/Hyperglycemia protocol      Best practice:  Diet:  Oral Pain/Anxiety/Delirium protocol (if indicated): No VAP protocol (if indicated): Not indicated DVT prophylaxis: Systemic AC GI prophylaxis: N/A Glucose control:  SSI Yes Central venous access:   N/A Arterial line:  N/A Foley:  N/A Mobility:  bed rest  PT consulted: N/A Last date of multidisciplinary goals of care discussion [12/16] Code Status:  full code Disposition: ICU   = Goals of Care = Code Status Order: FULL  Primary Emergency Contact: Dent,Sharon, Home Phone: [phone] Wishes to pursue full aggressive treatment and intervention options, including CPR and intubation, but goals of care will be addressed on going with family if that should become necessary.   Critical care provider statement:   Total critical care time: 33 minutes   Performed by: Karna Christmas MD   Critical care time was exclusive of separately billable procedures and treating other patients.   Critical care was necessary to treat or prevent imminent or life-threatening deterioration.   Critical care was time spent personally by me on the following activities: development of treatment plan with patient and/or surrogate as well as nursing, discussions with consultants, evaluation of patient's response to treatment, examination of patient, obtaining history from patient or surrogate, ordering and performing treatments and interventions, ordering and review of laboratory studies, ordering and review of radiographic studies, pulse oximetry and re-evaluation of patient's condition.    Vida Rigger, M.D.  Pulmonary & Critical Care Medicine

## 2023-06-24 NOTE — Op Note (Signed)
OPERATIVE NOTE   PROCEDURE: Irrigation and debridement of left groin abscess with debridement of skin, soft tissue, and muscle for approximately 240 cm 1604 thrombectomy right Rotational sartorius muscle flap for coverage of the existing bypass grafts Placement of a nondisposable wound VAC dressing left groin Redo operation left groin  PRE-OPERATIVE DIAGNOSIS: Nonviable tissue and infection of   left groin wound status post femoral to femoral and femoral to popliteal bypass grafts  POST-OPERATIVE DIAGNOSIS: Same as above  SURGEONS: [name], MD and [name], cosurgeons  ASSISTANT(S): none  ANESTHESIA: general  ESTIMATED BLOOD LOSS: 50 cc  FINDING(S): Seroma cavity with some necrotic muscle and tendon and exposed PTFE bypass graft.  SPECIMEN(S): Seroma cavity fluid aspirated and sent for Gram stain and culture.  A deep wound culture swab was also sent  INDICATIONS:   Laura Reed is a 62 y.o. female who presents with an infected seroma in the left groin with abscess formation after previous PTFE femoral to femoral and femoral to popliteal bypass grafts.  Co-surgeons were used due to the complex nature of the procedure including the redo nature of the procedure and the high risk of bleeding with her previous prosthetic bypass grafts.  DESCRIPTION: After obtaining full informed written consent, the patient was brought back to the operating room and placed supine upon the operating table.  The patient received IV antibiotics prior to induction.  After obtaining adequate anesthesia, the patient was prepped and draped in the standard fashion.  I worked on the right and Dr. Gilda Crease worked on the left.  The wound was then opened and murky fluid was exposed.  Some of this was aspirated and sent for Gram stain and culture.  There was clear nonviable tissue on the muscle fascia and the PTFE grafts were exposed.  The excision had to be extended both superiorly and inferiorly from  her native incisions and the total length of the incision was roughly 22 cm.  Incision was also about 11 or 12 cm in width at its widest dimension.  There was also roughly 4 cm in depth.  It was clear we were going to need to get coverage over the bypass grafts which were exposed and prosthetic. Excisional debridement was performed to skin, soft tissue, and the muscle as well as fascia to remove all clearly non-viable tissue.  The tissue was taken back to bleeding tissue that appeared viable.  The debridement was performed with electrocautery and scissors and encompassed an area of approximately 240 cm2.  The wound was irrigated copiously with liters of GU irrigation.  Hemostasis achieved with Bovie electrocautery largely.  After all clearly non-viable tissue was removed, we looked to get coverage over the prosthetic bypass grafts.  We elected to place a sartorius flap. The sartorius muscle was identified and we extended the incision proximally to expose the muscle all the way up to the origin of the iliac crest.  This was difficult and co-surgeons were beneficial to get the approximately mobilization of the sartorius flap.  Dissection was performed laterally and superiorly and was taken off of its insertion to the iliac crest and rotated down covering the PTFE prosthetic in its entirety.  The lateral border was secured with 2-0 Vicryl mattress sutures.  The medial side of the sartorius muscle was tacked down to the deep tissues with 2-0 Vicryl sutures as well.  Gentamicin and vancomycin beads were reconstituted on the back table and placed in the bed of the wound all along  the prosthetic as well as in the more subcutaneous tissue.  The dead space in the bed of the sartorius muscle was closed with interrupted 3-0 Vicryl sutures.  Wound was then irrigated with saline and a VAC dressing was placed.  A medium VAC sponge was cut to fit the wound and trimmed to the appropriate dimensions.  Collier Flowers was used and a good  occlusive seal was obtained.  The VAC was connected to suction and excellent seal was noted in the nondisposable VAC prior to leaving the OR. The patient was then awakened from anesthesia and taken to the recovery room in stable condition having tolerated the procedure well.  COMPLICATIONS: none  CONDITION: stable  [name]  06/24/2023, 4:07 PM   This note was created with Dragon Medical transcription system. Any errors in dictation are purely unintentional.

## 2023-06-24 NOTE — Inpatient Diabetes Management (Signed)
Inpatient Diabetes Program Recommendations  AACE/ADA: New Consensus Statement on Inpatient Glycemic Control   Target Ranges:  Prepandial:   less than 140 mg/dL      Peak postprandial:   less than 180 mg/dL (1-2 hours)      Critically ill patients:  140 - 180 mg/dL    Latest Reference Range & Units 06/23/23 08:43 06/23/23 11:46 06/23/23 16:10 06/23/23 18:41 06/23/23 19:59 06/24/23 08:20  Glucose-Capillary 70 - 99 mg/dL 782 (H) 956 (H) 213 (H) 239 (H) 225 (H) 241 (H)   Review of Glycemic Control  Diabetes history: DM2 Outpatient Diabetes medications: Tresiba 22 every day, Humalog 10 TID, Jardiance 25 every day  Current orders for Inpatient glycemic control: Semglee 14 units at bedtime, Novolog 0-9 units TID with meals, Novolog 0-5 units QHS  Inpatient Diabetes Program Recommendations:    Insulin: Please consider increasing Semglee to 17 units at bedtime and if patient will remain NPO, consider changing CBGs and Novolog 0-9 units to Q4H.  Thanks, Orlando Penner, RN, MSN, CDCES Diabetes Coordinator Inpatient Diabetes Program [phone] (Team Pager from 8am to 5pm)

## 2023-06-24 NOTE — Progress Notes (Signed)
PT in OR

## 2023-06-25 ENCOUNTER — Encounter: Payer: Self-pay | Admitting: Vascular Surgery

## 2023-06-25 DIAGNOSIS — T8149XA Infection following a procedure, other surgical site, initial encounter: Secondary | ICD-10-CM | POA: Diagnosis not present

## 2023-06-25 DIAGNOSIS — I739 Peripheral vascular disease, unspecified: Secondary | ICD-10-CM | POA: Diagnosis not present

## 2023-06-25 DIAGNOSIS — B9620 Unspecified Escherichia coli [E. coli] as the cause of diseases classified elsewhere: Secondary | ICD-10-CM

## 2023-06-25 DIAGNOSIS — L02214 Cutaneous abscess of groin: Secondary | ICD-10-CM | POA: Diagnosis not present

## 2023-06-25 LAB — ECHOCARDIOGRAM COMPLETE
Area-P 1/2: 3.68 cm2
Height: 67 in
S' Lateral: 4.6 cm
Weight: 2262.41 [oz_av]

## 2023-06-25 LAB — CBC
HCT: 28.4 % — ABNORMAL LOW (ref 36.0–46.0)
Hemoglobin: 9.5 g/dL — ABNORMAL LOW (ref 12.0–15.0)
MCH: 29.7 pg (ref 26.0–34.0)
MCHC: 33.5 g/dL (ref 30.0–36.0)
MCV: 88.8 fL (ref 80.0–100.0)
Platelets: 299 10*3/uL (ref 150–400)
RBC: 3.2 MIL/uL — ABNORMAL LOW (ref 3.87–5.11)
RDW: 13.8 % (ref 11.5–15.5)
WBC: 15.5 10*3/uL — ABNORMAL HIGH (ref 4.0–10.5)
nRBC: 0 % (ref 0.0–0.2)

## 2023-06-25 LAB — RENAL FUNCTION PANEL
Albumin: 1.9 g/dL — ABNORMAL LOW (ref 3.5–5.0)
Anion gap: 6 (ref 5–15)
BUN: 18 mg/dL (ref 8–23)
CO2: 18 mmol/L — ABNORMAL LOW (ref 22–32)
Calcium: 7.7 mg/dL — ABNORMAL LOW (ref 8.9–10.3)
Chloride: 109 mmol/L (ref 98–111)
Creatinine, Ser: 0.79 mg/dL (ref 0.44–1.00)
GFR, Estimated: >60 mL/min (ref >60)
Glucose, Bld: 230 mg/dL — ABNORMAL HIGH (ref 70–99)
Phosphorus: 2.8 mg/dL (ref 2.5–4.6)
Potassium: 4.4 mmol/L (ref 3.5–5.1)
Sodium: 133 mmol/L — ABNORMAL LOW (ref 135–145)

## 2023-06-25 LAB — APTT: aPTT: 46 s — ABNORMAL HIGH (ref 24–36)

## 2023-06-25 LAB — GLUCOSE, CAPILLARY
Glucose-Capillary: 198 mg/dL — ABNORMAL HIGH (ref 70–99)
Glucose-Capillary: 229 mg/dL — ABNORMAL HIGH (ref 70–99)
Glucose-Capillary: 210 mg/dL — ABNORMAL HIGH (ref 70–99)
Glucose-Capillary: 142 mg/dL — ABNORMAL HIGH (ref 70–99)
Glucose-Capillary: 149 mg/dL — ABNORMAL HIGH (ref 70–99)

## 2023-06-25 LAB — MAGNESIUM: Magnesium: 1.9 mg/dL (ref 1.7–2.4)

## 2023-06-25 MED ORDER — CHLORHEXIDINE GLUCONATE 4 % EX SOLN
60.0000 mL | Freq: Once | CUTANEOUS | Status: AC
  Administered 2023-06-25: 4 via TOPICAL

## 2023-06-25 MED ORDER — SODIUM CHLORIDE 0.9 % IV SOLN
INTRAVENOUS | Status: DC
Start: 2023-06-26 — End: 2023-06-26

## 2023-06-25 MED ORDER — CHLORHEXIDINE GLUCONATE 4 % EX SOLN
60.0000 mL | Freq: Once | CUTANEOUS | Status: AC
  Administered 2023-06-26: 4 via TOPICAL

## 2023-06-25 MED ORDER — HEPARIN (PORCINE) 25000 UT/250ML-% IV SOLN
1400.0000 [IU]/h | INTRAVENOUS | Status: DC
  Administered 2023-06-25: 1000 [IU]/h via INTRAVENOUS
  Filled 2023-06-25: qty 250

## 2023-06-25 MED ORDER — INSULIN ASPART 100 UNIT/ML IJ SOLN
3.0000 [IU] | Freq: Three times a day (TID) | INTRAMUSCULAR | Status: DC
  Administered 2023-06-26 – 2023-06-27 (×3): 3 [IU] via SUBCUTANEOUS
  Filled 2023-06-25 (×3): qty 1

## 2023-06-25 MED ORDER — HEPARIN BOLUS VIA INFUSION
1900.0000 [IU] | Freq: Once | INTRAVENOUS | Status: AC
  Administered 2023-06-25: 1900 [IU] via INTRAVENOUS
  Filled 2023-06-25: qty 1900

## 2023-06-25 MED ORDER — INSULIN GLARGINE-YFGN 100 UNIT/ML ~~LOC~~ SOLN
19.0000 [IU] | Freq: Every day | SUBCUTANEOUS | Status: DC
  Administered 2023-06-25 – 2023-06-27 (×3): 19 [IU] via SUBCUTANEOUS
  Filled 2023-06-25 (×4): qty 0.19

## 2023-06-25 MED ORDER — HEPARIN BOLUS VIA INFUSION
2000.0000 [IU] | Freq: Once | INTRAVENOUS | Status: AC
  Administered 2023-06-25: 2000 [IU] via INTRAVENOUS
  Filled 2023-06-25: qty 2000

## 2023-06-25 NOTE — Plan of Care (Signed)
  Problem: Fluid Volume: Goal: Ability to maintain a balanced intake and output will improve Outcome: Progressing   Problem: Clinical Measurements: Goal: Diagnostic test results will improve Outcome: Progressing Goal: Respiratory complications will improve Outcome: Progressing Goal: Cardiovascular complication will be avoided Outcome: Progressing   Problem: Pain Management: Goal: General experience of comfort will improve Outcome: Progressing

## 2023-06-25 NOTE — Anesthesia Postprocedure Evaluation (Signed)
Anesthesia Post Note  Patient: Laura Reed  Procedure(s) Performed: INCISION AND DRAINAGE WITH SATORIUS FLAP (Left)  Patient location during evaluation: Nursing Unit Anesthesia Type: General Level of consciousness: awake, awake and alert and oriented Pain management: pain level controlled Vital Signs Assessment: post-procedure vital signs reviewed and stable Respiratory status: spontaneous breathing, nonlabored ventilation and respiratory function stable Cardiovascular status: blood pressure returned to baseline and stable Postop Assessment: no headache and no backache Anesthetic complications: no  No notable events documented.   Last Vitals:  Vitals:   06/25/23 0645 06/25/23 0705  BP: 91/66 101/73  Pulse: (!) 55 (!) 55  Resp: (!) 7 11  Temp:    SpO2: 100% 100%    Last Pain:  Vitals:   06/25/23 0730  TempSrc:   PainSc: 0-No pain                 Chiropodist

## 2023-06-25 NOTE — Progress Notes (Signed)
Progress Note    06/25/2023 9:16 AM 1 Day Post-Op  Subjective: Laura Reed is a 62 year old female now status postop day #1 from irrigation and debridement of left groin abscess with rotational sartorius muscle flap and placement of nondisposable wound VAC dressing to left groin.  Patient is resting comfortably in bed in ICU this morning.  Patient does endorse some pain to her left groin but states it is manageable.  She endorses she is having wound VAC before.  Patient remains on phenylephrine drip this morning for blood pressure control.  Patient was on amiodarone drip for rate control which is now off.  Vitals currently remained stable.  With no complaints overnight.   Vitals:   06/25/23 0830 06/25/23 0845  BP: (!) 60/38 91/63  Pulse: (!) 58 (!) 34  Resp: 19 (!) 9  Temp:    SpO2: 99% 100%   Physical Exam: Cardiac:  RRR, normal S1 and S2.  No murmurs to appreciate. Lungs: Nonlabored breathing and clear on auscultation throughout.  No rales rhonchi or wheezing noted. Incisions: Postsurgical incision to left groin with wound VAC in place.  Working well. Extremities: Bilateral lower extremities do not have palpable pulses but have excellent Doppler pulses on the left. Abdomen: Positive bowel sounds throughout, soft, nontender and nondistended. Neurologic: Alert and oriented x 3, answers all questions and follows commands appropriately.  CBC    Component Value Date/Time   WBC 15.5 (H) 06/25/2023 0645   RBC 3.20 (L) 06/25/2023 0645   HGB 9.5 (L) 06/25/2023 0645   HGB 9.5 (L) 11/02/2014 0609   HCT 28.4 (L) 06/25/2023 0645   HCT 29.5 (L) 11/02/2014 0609   PLT 299 06/25/2023 0645   PLT 217 11/02/2014 0609   MCV 88.8 06/25/2023 0645   MCV 87 11/02/2014 0609   MCH 29.7 06/25/2023 0645   MCHC 33.5 06/25/2023 0645   RDW 13.8 06/25/2023 0645   RDW 13.6 11/02/2014 0609   LYMPHSABS 1.3 09/08/2022 1044   LYMPHSABS 1.2 11/02/2014 0609   MONOABS 0.7 09/08/2022 1044   MONOABS 1.1  (H) 11/02/2014 0609   EOSABS 0.1 09/08/2022 1044   EOSABS 0.3 11/02/2014 0609   BASOSABS 0.1 09/08/2022 1044   BASOSABS 0.0 11/02/2014 0609    BMET    Component Value Date/Time   NA 133 (L) 06/25/2023 0645   NA 136 08/02/2021 1629   NA 135 11/02/2014 0609   K 4.4 06/25/2023 0645   K 4.2 11/02/2014 0609   CL 109 06/25/2023 0645   CL 107 11/02/2014 0609   CO2 18 (L) 06/25/2023 0645   CO2 23 11/02/2014 0609   GLUCOSE 230 (H) 06/25/2023 0645   GLUCOSE 108 (H) 11/02/2014 0609   BUN 18 06/25/2023 0645   BUN 25 08/02/2021 1629   BUN 20 11/02/2014 0609   CREATININE 0.79 06/25/2023 0645   CREATININE 1.01 11/09/2015 1549   CREATININE 1.01 11/09/2015 1549   CALCIUM 7.7 (L) 06/25/2023 0645   CALCIUM 7.3 (L) 11/02/2014 0609   GFRNONAA >60 06/25/2023 0645   GFRNONAA 50 (L) 11/02/2014 0609   GFRAA >60 01/19/2019 0357   GFRAA 58 (L) 11/02/2014 0609    INR    Component Value Date/Time   INR 1.1 06/21/2023 2304   INR 0.9 10/17/2014 1131     Intake/Output Summary (Last 24 hours) at 06/25/2023 0916 Last data filed at 06/25/2023 0644 Gross per 24 hour  Intake 2447.77 ml  Output 1050 ml  Net 1397.77 ml     Assessment/Plan:  62 y.o. female is s/p  from irrigation and debridement of left groin abscess with rotational sartorius muscle flap and placement of nondisposable wound VAC dressing to left groin.   1 Day Post-Op   PLAN: Patient be taken back to the operating room on Monday, 06/29/2023 for wound VAC exchange. Patient to be restarted on a heparin infusion for her atrial fibrillation and return to the operating for procedure. Pain medication as needed Advance diet as tolerated Work with physical therapy and Occupational Therapy.  DVT prophylaxis: Heparin infusion   Marcie Bal [facility] 06/25/2023 9:16 AM

## 2023-06-25 NOTE — Progress Notes (Signed)
PHARMACY - ANTICOAGULATION CONSULT NOTE  Pharmacy Consult for heparin infusion Indication: atrial fibrillation  No Known Allergies  Patient Measurements: Height: 5\' 7"  (170.2 cm) Weight: 64.1 kg (141 lb 6.4 oz) IBW/kg (Calculated) : 61.6 Heparin Dosing Weight: 64.1 kg  Vital Signs: Temp: 97.8 F (36.6 C) (12/19 1951) Temp Source: Oral (12/19 1951) BP: 104/70 (12/19 2035) Pulse Rate: 57 (12/19 2035)  Labs: Recent Labs    06/23/23 0656 06/24/23 0445 06/25/23 0645 06/25/23 1954  HGB 10.5* 11.2* 9.5*  --   HCT 30.5* 33.1* 28.4*  --   PLT 267 326 299  --   APTT  --   --   --  46*  CREATININE 0.88 0.72 0.79  --   CKTOTAL  --  34*  --   --     Estimated Creatinine Clearance: 69.1 mL/min (by C-G formula based on SCr of 0.79 mg/dL).   Medical History: Past Medical History:  Diagnosis Date   Absence of kidney    left   Anxiety    Arthritis    Atherosclerosis of native arteries of extremities with intermittent claudication, bilateral legs (HCC)    Bladder cancer (HCC)    CHF (congestive heart failure) (HCC)    Complication of anesthesia    BP HAS  RUN LOW AFTER SURGERY-LUNGS FILLED UP WITH FLUID AFTER  LEG STENT SURGERY    Coronary artery disease    Diabetes mellitus    Family history of adverse reaction to anesthesia    Sister - PONV   GERD (gastroesophageal reflux disease)    OCC TUMS   Heart murmur    Hemorrhoid    History of methicillin resistant staphylococcus aureus (MRSA) 2007   Hypertension    Neuropathy    PVD (peripheral vascular disease) (HCC)    Thyroid nodule    right   Unspecified osteoarthritis, unspecified site    Urothelial carcinoma of kidney (HCC) 10/31/2014   INVASIVE UROTHELIAL CARCINOMA, LOW GRADE. T1, Nx.   Vitamin D deficiency, unspecified    Wears dentures    full upper and lower    Medications:  Scheduled:   aspirin  81 mg Oral QHS   atorvastatin  20 mg Oral Daily   Chlorhexidine Gluconate Cloth  6 each Topical Daily    cholecalciferol  2,000 Units Oral Daily   gabapentin  300 mg Oral BID   insulin aspart  0-15 Units Subcutaneous TID AC & HS   [START ON 06/26/2023] insulin aspart  3 Units Subcutaneous TID WC   insulin glargine-yfgn  19 Units Subcutaneous QHS   midodrine  10 mg Oral TID WC   multivitamin with minerals  1 tablet Oral Daily   sodium chloride  1 g Oral BID WC    Assessment: 62 y.o female with significant PMH of HTN, HLD, DM, sCHF, CAD, PAD (s/p of left common femoral-distal bypass, thrombectomy, angioplasty and stent placement 04/2023 on aspirin, Plavix and Eliquis), anxiety, bladder cancer (s/p of nephrectomy) who presented to the ED with chief complaints of left groin mass. Noted apixaban prior to arrival with last dose documented 06/20/23 pm  Baseline Labs: INR 1.1, aPTT 35s, H&H/PLT trending down slightly  Date/Time  aPTT  Rate  Comment 12/19 1954 46 1000 units/hr Subtherapeutic  Goal of Therapy:  Heparin level 0.3-0.7 units/ml aPTT 66 - 102 seconds Monitor platelets by anticoagulation protocol: Yes   Plan:  ---aPTT level subtherapeutic ---Give 1900 units bolus x 1  ---Increase heparin infusion rate to 1200 units/hr ---Check aPTT  in 6 hours and at least once daily while on heparin (we will follow anti-Xa levels until aPTT and anti-Xa levels correlate) ---Continue to monitor H&H and platelets  Merryl Hacker, PharmD Clinical Pharmacist 06/25/2023,8:46 PM

## 2023-06-25 NOTE — Inpatient Diabetes Management (Signed)
Inpatient Diabetes Program Recommendations  AACE/ADA: New Consensus Statement on Inpatient Glycemic Control   Target Ranges:  Prepandial:   less than 140 mg/dL      Peak postprandial:   less than 180 mg/dL (1-2 hours)      Critically ill patients:  140 - 180 mg/dL    Latest Reference Range & Units 06/25/23 03:00 06/25/23 07:45 06/25/23 11:24  Glucose-Capillary 70 - 99 mg/dL 914 (H) 782 (H) 956 (H)    Latest Reference Range & Units 06/24/23 08:20 06/24/23 13:55 06/24/23 15:58 06/24/23 20:12  Glucose-Capillary 70 - 99 mg/dL 213 (H) 086 (H) 578 (H) 280 (H)   Review of Glycemic Control  Diabetes history: DM2 Outpatient Diabetes medications: Tresiba 22 every day, Humalog 10 TID, Jardiance 25 every day  Current orders for Inpatient glycemic control: Semglee 17 units at bedtime, Novolog 0-15 units AC&HS   Inpatient Diabetes Program Recommendations:     Insulin: Please consider increasing Semglee to 19 units at bedtime and ordering Novolog 3 units TID with meals for meal coverage if patient eats at least 50% of meals.  Thanks, Orlando Penner, RN, MSN, CDCES Diabetes Coordinator Inpatient Diabetes Program [phone] (Team Pager from 8am to 5pm)

## 2023-06-25 NOTE — Evaluation (Signed)
Physical Therapy Evaluation Patient Details Name: Laura Reed MRN: [id number] DOB: 10-18-1960 Today's Date: 06/25/2023  History of Present Illness  Laura Reed  has presented today for surgery, with the diagnosis of Groin Infection. S/P I & D left groin. On April 29, 2023 she underwent a femoral to tibioperoneal trunk bypass using a PTFE distal flow graft.  She was noted to thrombose within the first 48 hours and subsequently underwent angiography with salvage of her bypass and stenting of the anterior tibial artery.  Following the second procedure which was May 01, 2023 she did well and was discharged to home approximately 10 days later.   Clinical Impression  Patient received in bed, she is alert, talkative. Agrees to PT assessment. Patient reports she would like to get up to use the bathroom. Patient is mod I with supine to sit. Cga for transfers and ambulation a few feet along edge of bed. Patient reports minimal pain. She will continue to benefit from skilled PT to improve functional mobility and independence.          If plan is discharge home, recommend the following: A little help with walking and/or transfers;A little help with bathing/dressing/bathroom;Assistance with cooking/housework;Help with stairs or ramp for entrance;Assist for transportation   Can travel by private vehicle   yes     Equipment Recommendations None recommended by PT  Recommendations for Other Services       Functional Status Assessment Patient has had a recent decline in their functional status and demonstrates the ability to make significant improvements in function in a reasonable and predictable amount of time.     Precautions / Restrictions Precautions Precautions: Fall Restrictions Weight Bearing Restrictions Per Provider Order: No      Mobility  Bed Mobility Overal bed mobility: Needs Assistance Bed Mobility: Supine to Sit, Sit to Supine     Supine to sit: Modified  independent (Device/Increase time) Sit to supine: Contact guard assist   General bed mobility comments: CGA to bring L LE back up onto bed    Transfers Overall transfer level: Needs assistance Equipment used: None Transfers: Sit to/from Stand, Bed to chair/wheelchair/BSC Sit to Stand: Contact guard assist   Step pivot transfers: Contact guard assist            Ambulation/Gait Ambulation/Gait assistance: Contact guard assist Gait Distance (Feet): 4 Feet Assistive device: 1 person hand held assist Gait Pattern/deviations: Step-to pattern, Decreased step length - right, Decreased step length - left, Decreased stride length, Trunk flexed Gait velocity: decr     General Gait Details: patient able to ambulate from [facility] up to [facility] with cga.  Stairs            Wheelchair Mobility     Tilt Bed    Modified Rankin (Stroke Patients Only)       Balance Overall balance assessment: Needs assistance Sitting-balance support: Feet supported Sitting balance-Leahy Scale: Good     Standing balance support: Bilateral upper extremity supported, During functional activity, Reliant on assistive device for balance Standing balance-Leahy Scale: Fair                               Pertinent Vitals/Pain Pain Assessment Pain Assessment: Faces Faces Pain Scale: Hurts a little bit Pain Location: L groin Pain Descriptors / Indicators: Discomfort, Sore Pain Intervention(s): Monitored during session, Repositioned    Home Living Family/patient expects to be discharged to:: Private residence Living Arrangements:  Alone Available Help at Discharge: Family;Available PRN/intermittently Type of Home: House Home Access: Stairs to enter Entrance Stairs-Rails: Right;Left;Can reach both Entrance Stairs-Number of Steps: 2   Home Layout: One level Home Equipment: Agricultural consultant (2 wheels) Additional Comments: has rolling walker, was not using    Prior Function Prior Level of  Function : Independent/Modified Independent;Driving             Mobility Comments: had progressed with mobility and was not using AD ADLs Comments: independent     Extremity/Trunk Assessment   Upper Extremity Assessment Upper Extremity Assessment: Overall WFL for tasks assessed    Lower Extremity Assessment Lower Extremity Assessment: LLE deficits/detail LLE Deficits / Details: prior toe amputations, wound vac LLE Coordination: decreased gross motor    Cervical / Trunk Assessment Cervical / Trunk Assessment: Normal  Communication   Communication Communication: No apparent difficulties Cueing Techniques: Verbal cues;Gestural cues  Cognition Arousal: Alert Behavior During Therapy: WFL for tasks assessed/performed Overall Cognitive Status: Within Functional Limits for tasks assessed                                 General Comments: chatty        General Comments      Exercises     Assessment/Plan    PT Assessment Patient needs continued PT services  PT Problem List Decreased strength;Decreased mobility;Decreased activity tolerance;Decreased balance;Pain;Decreased skin integrity       PT Treatment Interventions DME instruction;Gait training;Stair training;Functional mobility training;Therapeutic activities;Patient/family education;Balance training;Therapeutic exercise    PT Goals (Current goals can be found in the Care Plan section)  Acute Rehab PT Goals Patient Stated Goal: to return home, states she will be here a while, will need additional procedures PT Goal Formulation: With patient Time For Goal Achievement: 07/15/23 Potential to Achieve Goals: Good    Frequency Min 1X/week     Co-evaluation               AM-PAC PT "6 Clicks" Mobility  Outcome Measure Help needed turning from your back to your side while in a flat bed without using bedrails?: A Little Help needed moving from lying on your back to sitting on the side of a flat  bed without using bedrails?: A Little Help needed moving to and from a bed to a chair (including a wheelchair)?: A Little Help needed standing up from a chair using your arms (e.g., wheelchair or bedside chair)?: A Little Help needed to walk in hospital room?: A Little Help needed climbing 3-5 steps with a railing? : A Lot 6 Click Score: 17    End of Session   Activity Tolerance: Patient tolerated treatment well Patient left: in bed;with call bell/phone within reach Nurse Communication: Mobility status PT Visit Diagnosis: Other abnormalities of gait and mobility (R26.89);Unsteadiness on feet (R26.81);Pain;Difficulty in walking, not elsewhere classified (R26.2);Muscle weakness (generalized) (M62.81) Pain - Right/Left: Left Pain - part of body: Leg    Time: 8295-6213 PT Time Calculation (min) (ACUTE ONLY): 24 min   Charges:   PT Evaluation $PT Eval Moderate Complexity: 1 Mod PT Treatments $Therapeutic Activity: 8-22 mins PT General Charges $$ ACUTE PT VISIT: 1 Visit         Clydell Sposito, PT, GCS 06/25/23,2:16 PM

## 2023-06-25 NOTE — Progress Notes (Signed)
PHARMACY CONSULT NOTE - FOLLOW UP  Pharmacy Consult for Electrolyte Monitoring and Replacement   Recent Labs: Potassium (mmol/L)  Date Value  06/25/2023 4.4  11/02/2014 4.2   Magnesium (mg/dL)  Date Value  16/04/9603 2.0  06/03/2013 2.0   Calcium (mg/dL)  Date Value  54/03/8118 7.7 (L)   Calcium, Total (mg/dL)  Date Value  14/78/2956 7.3 (L)   Albumin (g/dL)  Date Value  21/30/8657 1.9 (L)  08/02/2021 4.3  06/03/2013 2.2 (L)   Phosphorus (mg/dL)  Date Value  84/69/6295 2.8   Sodium (mmol/L)  Date Value  06/25/2023 133 (L)  08/02/2021 136  11/02/2014 135   Corrected Ca: 9.4 mg/dL  Assessment: 62 y.o female with significant PMH of HTN, HLD, DM, sCHF, CAD, PAD (s/p of left common femoral-distal bypass, thrombectomy, angioplasty and stent placement 04/2023 on aspirin, Plavix and Eliquis), anxiety, bladder cancer (s/p of nephrectomy) who presented to the ED with chief complaints of left groin mass.   Goal of Therapy:  Electrolytes WNL  Plan:  ---no electrolyte replacement warranted for today ---recheck electrolytes in am  Lowella Bandy ,PharmD Clinical Pharmacist 06/25/2023 9:04 AM

## 2023-06-25 NOTE — Progress Notes (Signed)
Date of Admission:  06/21/2023      ID: Laura Reed is a 62 y.o. female with   Principal Problem:   Left leg pain Active Problems:   Hypertension   PVD (peripheral vascular disease) (HCC)   Hyperlipidemia associated with type 2 diabetes mellitus (HCC)   CAD (coronary artery disease)   Type II diabetes mellitus with peripheral circulatory disorder (HCC)   AKI (acute kidney injury) (HCC)   Chronic diastolic CHF (congestive heart failure) (HCC)   Hyponatremia   Diarrhea   Leukocytosis   Hypophosphatemia   Normocytic anemia    Subjective: Pt says she is doing fine Sitting in bed and talking to her sister Worked with PT  Medications:   aspirin  81 mg Oral QHS   atorvastatin  20 mg Oral Daily   Chlorhexidine Gluconate Cloth  6 each Topical Daily   cholecalciferol  2,000 Units Oral Daily   gabapentin  300 mg Oral BID   insulin aspart  0-15 Units Subcutaneous TID AC & HS   insulin glargine-yfgn  17 Units Subcutaneous QHS   midodrine  10 mg Oral TID WC   multivitamin with minerals  1 tablet Oral Daily   sodium chloride  1 g Oral BID WC    Objective: Vital signs in last 24 hours: Patient Vitals for the past 24 hrs:  BP Temp Temp src Pulse Resp SpO2  06/25/23 1600 99/67 97.8 F (36.6 C) -- (!) 25 (!) 9 96 %  06/25/23 1500 121/74 -- -- (!) 56 (!) 9 100 %  06/25/23 1400 109/64 -- -- (!) 45 15 100 %  06/25/23 1330 (!) 101/55 -- -- 60 14 100 %  06/25/23 1315 114/72 -- -- (!) 59 10 100 %  06/25/23 1300 99/69 -- -- (!) 58 11 100 %  06/25/23 1245 102/72 -- -- -- 15 --  06/25/23 1145 (!) 85/65 (!) 97.2 F (36.2 C) -- (!) 55 13 100 %  06/25/23 1130 96/66 -- -- (!) 57 15 (!) 84 %  06/25/23 1115 111/61 -- -- (!) 45 11 100 %  06/25/23 1100 123/68 -- -- (!) 56 12 100 %  06/25/23 1045 113/62 -- -- (!) 57 13 100 %  06/25/23 1030 113/64 -- -- (!) 41 12 100 %  06/25/23 1015 114/78 -- -- (!) 57 (!) 8 100 %  06/25/23 1000 110/77 -- -- (!) 57 11 100 %  06/25/23 0930 (!) 85/54 --  -- (!) 32 (!) 8 99 %  06/25/23 0915 (!) 73/53 -- -- (!) 48 15 (!) 63 %  06/25/23 0900 (!) 80/51 -- -- (!) 34 (!) 9 98 %  06/25/23 0845 91/63 -- -- (!) 34 (!) 9 100 %  06/25/23 0830 (!) 60/38 -- -- (!) 58 19 99 %  06/25/23 0815 116/64 (!) 97.4 F (36.3 C) -- (!) 30 15 96 %  06/25/23 0745 (!) 74/64 -- -- 97 13 98 %  06/25/23 0730 105/67 -- -- (!) 53 (!) 8 100 %  06/25/23 0715 92/64 -- -- (!) 54 (!) 8 94 %  06/25/23 0705 101/73 -- -- (!) 55 11 100 %  06/25/23 0645 91/66 -- -- (!) 55 (!) 7 100 %  06/25/23 0635 (!) 85/62 -- -- (!) 58 17 100 %  06/25/23 0630 (!) 85/52 -- -- 99 12 92 %  06/25/23 0615 101/63 -- -- (!) 50 (!) 9 99 %  06/25/23 0608 (!) 103/53 -- -- (!) 54 10 99 %  06/25/23 0600 -- -- -- (!) 55 11 100 %  06/25/23 0545 102/63 -- -- (!) 48 15 97 %  06/25/23 0530 100/64 -- -- (!) 51 11 100 %  06/25/23 0515 -- -- -- (!) 53 14 100 %  06/25/23 0500 (!) 108/55 -- -- (!) 51 (!) 9 99 %  06/25/23 0445 (!) 99/53 -- -- (!) 51 (!) 9 100 %  06/25/23 0430 105/62 -- -- (!) 53 12 100 %  06/25/23 0415 (!) 102/57 -- -- (!) 51 12 100 %  06/25/23 0400 (!) 102/56 97.6 F (36.4 C) Oral (!) 51 12 99 %  06/25/23 0345 (!) 109/51 -- -- (!) 52 15 99 %  06/25/23 0330 (!) 102/54 -- -- (!) 51 10 99 %  06/25/23 0315 (!) 80/55 -- -- (!) 51 11 99 %  06/25/23 0300 (!) 95/59 -- -- (!) 54 12 100 %  06/25/23 0245 (!) 104/55 -- -- (!) 52 (!) 9 100 %  06/25/23 0230 100/60 -- -- (!) 54 (!) 9 100 %  06/25/23 0215 104/81 -- -- (!) 51 11 100 %  06/25/23 0200 115/67 -- -- (!) 52 (!) 9 99 %  06/25/23 0145 115/68 -- -- (!) 53 (!) 9 100 %  06/25/23 0130 100/87 -- -- 93 12 100 %  06/25/23 0115 106/61 -- -- (!) 46 13 100 %  06/25/23 0100 (!) 100/58 -- -- (!) 52 14 100 %  06/25/23 0045 (!) 108/59 -- -- (!) 54 (!) 9 100 %  06/25/23 0030 (!) 96/59 -- -- (!) 53 (!) 9 100 %  06/25/23 0015 100/75 -- -- (!) 53 11 99 %  06/25/23 0000 (!) 99/59 97.6 F (36.4 C) Oral (!) 51 11 100 %  06/24/23 2345 (!) 104/54 -- -- (!) 54 10  100 %  06/24/23 2330 101/66 -- -- (!) 57 11 100 %  06/24/23 2315 111/63 -- -- (!) 57 11 100 %  06/24/23 2300 112/66 -- -- (!) 55 11 100 %  06/24/23 2245 115/68 -- -- (!) 56 10 99 %  06/24/23 2230 115/75 -- -- (!) 55 11 99 %  06/24/23 2215 114/61 -- -- (!) 58 10 99 %  06/24/23 2200 117/76 -- -- (!) 58 10 99 %  06/24/23 2145 124/69 -- -- 61 11 100 %  06/24/23 2130 120/63 -- -- 63 11 98 %  06/24/23 2115 120/73 -- -- (!) 59 10 100 %  06/24/23 2100 (!) 114/56 -- -- 60 (!) 9 100 %  06/24/23 2045 128/75 -- -- 61 10 100 %  06/24/23 2036 (!) 114/57 -- -- 60 14 100 %  06/24/23 2018 97/64 -- -- 61 14 100 %  06/24/23 2000 107/70 98 F (36.7 C) Oral (!) 54 19 98 %  06/24/23 1945 (!) 102/58 -- -- 61 11 99 %  06/24/23 1930 107/74 -- -- 62 11 100 %  06/24/23 1915 (!) 85/48 -- -- 60 16 98 %  06/24/23 1900 (!) 88/48 -- -- (!) 58 11 99 %  06/24/23 1845 (!) 85/44 -- -- 62 11 98 %  06/24/23 1830 (!) 67/57 -- -- 63 10 99 %  06/24/23 1815 (!) 81/60 -- -- (!) 28 13 94 %  06/24/23 1800 (!) 144/117 -- -- 71 15 93 %  06/24/23 1730 (!) 86/71 -- -- (!) 28 14 99 %      PHYSICAL EXAM:  General: Alert, cooperative, no distress, appears stated age.  Head: Normocephalic, without obvious  abnormality, atraumatic. Eyes: Conjunctivae clear, anicteric sclerae. Pupils are equal ENT Nares normal. No drainage or sinus tenderness. Lips, mucosa, and tongue normal. No Thrush Neck: Supple, symmetrical, no adenopathy, thyroid: non tender no carotid bruit and no JVD. Back: No CVA tenderness. Lungs: Clear to auscultation bilaterally. No Wheezing or Rhonchi. No rales. Heart: Regular rate and rhythm, no murmur, rub or gallop. Abdomen: Soft, non-tender,not distended. Bowel sounds normal. No masses Extremities:left groin wound vac Skin: No rashes or lesions. Or bruising Lymph: Cervical, supraclavicular normal. Neurologic: Grossly non-focal  Lab Results    Latest Ref Rng & Units 06/25/2023    6:45 AM 06/24/2023    4:45  AM 06/23/2023    6:56 AM  CBC  WBC 4.0 - 10.5 K/uL 15.5  18.7  15.5   Hemoglobin 12.0 - 15.0 g/dL 9.5  16.1  09.6   Hematocrit 36.0 - 46.0 % 28.4  33.1  30.5   Platelets 150 - 400 K/uL 299  326  267        Latest Ref Rng & Units 06/25/2023    6:45 AM 06/24/2023    4:45 AM 06/24/2023   12:17 AM  CMP  Glucose 70 - 99 mg/dL 045  409    BUN 8 - 23 mg/dL 18  19    Creatinine 8.11 - 1.00 mg/dL 9.14  7.82    Sodium 956 - 145 mmol/L 133  132    Potassium 3.5 - 5.1 mmol/L 4.4  3.6  3.5   Chloride 98 - 111 mmol/L 109  104    CO2 22 - 32 mmol/L 18  21    Calcium 8.9 - 10.3 mg/dL 7.7  7.5    Total Protein 6.5 - 8.1 g/dL  5.5    Total Bilirubin <1.2 mg/dL  0.4    Alkaline Phos 38 - 126 U/L  113    AST 15 - 41 U/L  16    ALT 0 - 44 U/L  14        Microbiology:  Studies/Results: ECHOCARDIOGRAM COMPLETE Result Date: 06/25/2023    ECHOCARDIOGRAM REPORT   Patient Name:   LADAIJAH BRAGGS Date of Exam: 06/24/2023 Medical Rec #:  [id number]       Height:       67.0 in Accession #:    [id number]      Weight:       141.4 lb Date of Birth:  [birth year]       BSA:          1.745 m Patient Age:    62 years        BP:           107/74 mmHg Patient Gender: F               HR:           65 bpm. Exam Location:  [facility] Procedure: 2D Echo, Cardiac Doppler and Color Doppler Indications:     I48.91 Atrial Fibrillation.  History:         Patient has no prior history of Echocardiogram examinations.                  CHF, CAD, Signs/Symptoms:Murmur; Risk Factors:Hypertension and                  Diabetes.  Sonographer:     Daphine Deutscher RDCS Referring Phys:  [id number] Latina Craver [name] Diagnosing Phys: Julien Nordmann MD IMPRESSIONS  1. Left ventricular ejection fraction, by  estimation, is 35 to 40%. The left ventricle has moderately decreased function. The left ventricle demonstrates global hypokinesis. The left ventricular internal cavity size was mildly dilated. Left ventricular diastolic parameters are  indeterminate.  2. Right ventricular systolic function is normal. The right ventricular size is normal. There is mildly elevated pulmonary artery systolic pressure. The estimated right ventricular systolic pressure is 42.5 mmHg.  3. The mitral valve is normal in structure. Mild to moderate mitral valve regurgitation. No evidence of mitral stenosis.  4. Tricuspid valve regurgitation is mild to moderate.  5. The aortic valve is calcified. There is moderate calcification of the aortic valve. Aortic valve regurgitation is not visualized. Aortic valve sclerosis/calcification is present, without any evidence of aortic stenosis.  6. The inferior vena cava is dilated in size with >50% respiratory variability, suggesting right atrial pressure of 8 mmHg. FINDINGS  Left Ventricle: Left ventricular ejection fraction, by estimation, is 35 to 40%. The left ventricle has moderately decreased function. The left ventricle demonstrates global hypokinesis. The left ventricular internal cavity size was mildly dilated. There is no left ventricular hypertrophy. Left ventricular diastolic parameters are indeterminate. Right Ventricle: The right ventricular size is normal. No increase in right ventricular wall thickness. Right ventricular systolic function is normal. There is mildly elevated pulmonary artery systolic pressure. The tricuspid regurgitant velocity is 2.85  m/s, and with an assumed right atrial pressure of 10 mmHg, the estimated right ventricular systolic pressure is 42.5 mmHg. Left Atrium: Left atrial size was normal in size. Right Atrium: Right atrial size was normal in size. Pericardium: There is no evidence of pericardial effusion. Mitral Valve: The mitral valve is normal in structure. Mild to moderate mitral valve regurgitation. No evidence of mitral valve stenosis. Tricuspid Valve: The tricuspid valve is normal in structure. Tricuspid valve regurgitation is mild to moderate. No evidence of tricuspid stenosis. Aortic  Valve: The aortic valve is calcified. There is moderate calcification of the aortic valve. Aortic valve regurgitation is not visualized. Aortic valve sclerosis/calcification is present, without any evidence of aortic stenosis. Pulmonic Valve: The pulmonic valve was normal in structure. Pulmonic valve regurgitation is not visualized. No evidence of pulmonic stenosis. Aorta: The aortic root is normal in size and structure. Venous: The inferior vena cava is dilated in size with greater than 50% respiratory variability, suggesting right atrial pressure of 8 mmHg. IAS/Shunts: No atrial level shunt detected by color flow Doppler.  LEFT VENTRICLE PLAX 2D LVIDd:         5.90 cm   Diastology LVIDs:         4.60 cm   LV e' medial:    5.82 cm/s LV PW:         0.80 cm   LV E/e' medial:  25.1 LV IVS:        0.50 cm   LV e' lateral:   7.51 cm/s LVOT diam:     1.80 cm   LV E/e' lateral: 19.4 LV SV:         50 LV SV Index:   29 LVOT Area:     2.54 cm  RIGHT VENTRICLE            IVC RV Basal diam:  4.20 cm    IVC diam: 2.20 cm RV S prime:     8.98 cm/s TAPSE (M-mode): 2.4 cm LEFT ATRIUM             Index        RIGHT ATRIUM  Index LA diam:        4.10 cm 2.35 cm/m   RA Area:     13.20 cm LA Vol (A2C):   68.0 ml 38.96 ml/m  RA Volume:   29.20 ml  16.73 ml/m LA Vol (A4C):   47.6 ml 27.28 ml/m LA Biplane Vol: 58.8 ml 33.69 ml/m  AORTIC VALVE LVOT Vmax:   83.15 cm/s LVOT Vmean:  55.750 cm/s LVOT VTI:    0.196 m  AORTA Ao Root diam: 2.50 cm MITRAL VALVE                TRICUSPID VALVE MV Area (PHT): 3.68 cm     TR Peak grad:   32.5 mmHg MV Decel Time: 206 msec     TR Vmax:        285.00 cm/s MV E velocity: 146.00 cm/s MV A velocity: 79.00 cm/s   SHUNTS MV E/A ratio:  1.85         Systemic VTI:  0.20 m                             Systemic Diam: 1.80 cm Julien Nordmann MD Electronically signed by Julien Nordmann MD Signature Date/Time: 06/25/2023/5:07:32 PM    Final      Assessment/Plan: PAD s/p left common femoral  artery to tibioperoneal trunk artery bypass  with PTFE graft in OCT 2024  abscess at the surgical site- CT showed  10 cm complex collection s/p excisional debridement of the abscess and rotation of a sarorius muscle flap for coverage of existing bypass grafts  Ecoli in culture so far Currently on dapto and cefepime Await finaliztion of culture before we deescalate antibiotics   Left TMA- healed well  ecoli and enterococciu sin culture in sept when she had TMA  CAD s/p CABG   H/o bladder ca s/p left nephrectomy Solitary kidney- avoid nephrotoxic drugs   AKI-  resolved  Discussed the management with the patient

## 2023-06-25 NOTE — Progress Notes (Signed)
PHARMACY - ANTICOAGULATION CONSULT NOTE  Pharmacy Consult for heparin infusion Indication: atrial fibrillation  No Known Allergies  Patient Measurements: Height: 5\' 7"  (170.2 cm) Weight: 64.1 kg (141 lb 6.4 oz) IBW/kg (Calculated) : 61.6 Heparin Dosing Weight: 64.1 kg  Vital Signs: Temp: 97.4 F (36.3 C) (12/19 0815) Temp Source: Oral (12/19 0400) BP: 91/63 (12/19 0845) Pulse Rate: 34 (12/19 0845)  Labs: Recent Labs    06/23/23 0656 06/24/23 0445 06/25/23 0645  HGB 10.5* 11.2* 9.5*  HCT 30.5* 33.1* 28.4*  PLT 267 326 299  CREATININE 0.88 0.72 0.79  CKTOTAL  --  34*  --     Estimated Creatinine Clearance: 69.1 mL/min (by C-G formula based on SCr of 0.79 mg/dL).   Medical History: Past Medical History:  Diagnosis Date   Absence of kidney    left   Anxiety    Arthritis    Atherosclerosis of native arteries of extremities with intermittent claudication, bilateral legs (HCC)    Bladder cancer (HCC)    CHF (congestive heart failure) (HCC)    Complication of anesthesia    BP HAS  RUN LOW AFTER SURGERY-LUNGS FILLED UP WITH FLUID AFTER  LEG STENT SURGERY    Coronary artery disease    Diabetes mellitus    Family history of adverse reaction to anesthesia    Sister - PONV   GERD (gastroesophageal reflux disease)    OCC TUMS   Heart murmur    Hemorrhoid    History of methicillin resistant staphylococcus aureus (MRSA) 2007   Hypertension    Neuropathy    PVD (peripheral vascular disease) (HCC)    Thyroid nodule    right   Unspecified osteoarthritis, unspecified site    Urothelial carcinoma of kidney (HCC) 10/31/2014   INVASIVE UROTHELIAL CARCINOMA, LOW GRADE. T1, Nx.   Vitamin D deficiency, unspecified    Wears dentures    full upper and lower    Medications:  Scheduled:   aspirin  81 mg Oral QHS   atorvastatin  20 mg Oral Daily   Chlorhexidine Gluconate Cloth  6 each Topical Daily   cholecalciferol  2,000 Units Oral Daily   gabapentin  300 mg Oral BID    insulin aspart  0-15 Units Subcutaneous TID AC & HS   insulin glargine-yfgn  17 Units Subcutaneous QHS   midodrine  10 mg Oral TID WC   multivitamin with minerals  1 tablet Oral Daily   sodium chloride  1 g Oral BID WC    Assessment: 62 y.o female with significant PMH of HTN, HLD, DM, sCHF, CAD, PAD (s/p of left common femoral-distal bypass, thrombectomy, angioplasty and stent placement 04/2023 on aspirin, Plavix and Eliquis), anxiety, bladder cancer (s/p of nephrectomy) who presented to the ED with chief complaints of left groin mass. Noted apixaban prior to arrival with last dose documented 06/20/23 pm  Baseline Labs: INR 1.1, aPTT 35s, H&H/PLT trending down slightly  Goal of Therapy:  Heparin level 0.3-0.7 units/ml aPTT 66 - 102 seconds Monitor platelets by anticoagulation protocol: Yes   Plan:  ---Give 2000 units bolus x 1 (1/2 initial nomogram bolus per Dr Gilda Crease) ---Start heparin infusion at 1000 units/hr ---Check aPTT in 6 hours and at least once daily while on heparin (we will follow anti-Xa levels until aPTT and anti-Xa levels correlate) ---Continue to monitor H&H and platelets  Lowella Bandy 06/25/2023,9:57 AM

## 2023-06-25 NOTE — Progress Notes (Signed)
NAME:  Laura Reed, MRN:  [id number], DOB:  [birth year], LOS: 4 ADMISSION DATE:  06/21/2023, CONSULTATION DATE:  06/22/23 REFERRING MD: Lorretta Harp , CHIEF COMPLAINT:  Hypotension    HPI  62 y.o female with significant PMH of HTN, HLD, DM, sCHF, CAD, PAD (s/p of left common femoral-distal bypass, thrombectomy, angioplasty and stent placement 04/2023 on aspirin, Plavix and Eliquis), anxiety, bladder cancer (s/p of nephrectomy) who presented to the ED with chief complaints of   Patient presented to [facility] urgent care on 06/21/23 with complaints of right groin pain, redness and oozing post op. Patient has hx of PAD and recently underwent left common femoral-distal bypass, thrombectomy, angioplasty and stent placement 04/2023  and right transmetatarsal amputation. States that she has been having left leg swelling. No weeping. Some pitting. She thinks she has been having fever. She was noted to be hypotensive with SBP in the 50s and was sent to the ED for further evaluation.   ED Course: Initial vital signs showed HR of 72 beats/minute, BP 136/86 mm Hg, the RR 19 breaths/minute, and the oxygen saturation 96 % on RA and a temperature of 98.39F (36.7C). . Pertinent Labs/Diagnostics Findings: Na+/ K+: 124/4.2 Glucose: 2537 BUN/Cr.:39/1.29 Calcium:  AST/ALT: WBC: 18.8K/L, Hgb/Hct: 10.6/30.7   PCT: 12.39 Lactic acid:1.1  BNP: 557.6  Imaging:Left leg arterial Duplex: US DVT Left Medication administered in the ED: Vanc and Zosyn, IVFs bolus Disposition:Admitted to stepdown under [facility] service  Past Medical History  HTN, HLD, DM, sCHF, CAD, PAD (s/p of left common femoral-distal bypass, thrombectomy, angioplasty and stent placement 04/2023 on aspirin, Plavix and Eliquis), anxiety, bladder cancer (s/p of nephrectomy)  Significant Hospital Events   12/15: Admitted to [facility] service with sepsis secondary to suspected infected groin site 12/16: Remained hypotensive despite IV fluid resuscitation requiring  vasopressors.  PCCM consulted 06/23/23- patient more stable , s/p vascular surg eval plan for OR in AM 06/24/23- patient on neosyneprhine, for OR today.  S/p ID eval on daptomycin/cefepime, drained 200cc pus from L groin wound 06/25/23- patient improved, has been optimized for [facility] transfer, PCCM will sign off and available if needed  Consults:  PCCM Vascular  Procedures:  None  Significant Diagnostic Tests:  12/15: Left leg arterial Duplex: 10.6 cm complex collection presumably hematoma overlying the common femoral vessels, without associated pseudoaneurysm, actives/p extravasation, or AV fistula.  12/15: US DVT Left IMPRESSION: No evidence of DVT within the left lower extremity.  12/16: CTA Chest, abdomen and pelvis> IMPRESSION: 1. 8 cm enhancing fluid collection with air surrounding the left common and superficial femoral artery extending to the skin surface. Findings are worrisome for abscess. 2. Wedge-shaped area of hypodensity within the spleen suspicious for infarct. 3. Left nephrectomy changes. 4. Right nonobstructing nephrolithiasis. 5. Stable left adrenal nodule previously characterized as adenoma. 6. Colonic diverticulosis. 7. Well-circumscribed distal presacral mass measuring 4.0 cm is indeterminate. Findings may represent a complex cyst, but other etiologies are not excluded. 8. Bilateral common and external iliac artery stents are present. Can not confirm flow within the stents. There is flow in the aorta. Fem-fem bypass graft appears grossly patent. 9. Aortic atherosclerosis.  Interim History / Subjective:      Micro Data:  12/15: Blood culture x2> 12/16: MRSA PCR>>   Antimicrobials:  Vancomycin 12/15>> Cefepime 12/15>>  OBJECTIVE  Blood pressure 91/63, pulse (!) 34, temperature (!) 97.4 F (36.3 C), resp. rate (!) 9, height 5\' 7"  (1.702 m), weight 64.1 kg, SpO2 100%.  Intake/Output Summary (Last 24 hours) at 06/25/2023 1100 Last data  filed at 06/25/2023 0644 Gross per 24 hour  Intake 2447.77 ml  Output 1050 ml  Net 1397.77 ml   Filed Weights   06/21/23 1348  Weight: 64.1 kg   Physical Examination  GENERAL: 62 year-old female critically ill patient lying in the bed  EYES: PEERLA. No scleral icterus. Extraocular muscles intact.  HEENT: Head atraumatic, normocephalic. Oropharynx and nasopharynx clear.  NECK:  No JVD, supple  LUNGS: Normal breath sounds bilaterally.  No use of accessory muscles of respiration.  CARDIOVASCULAR: S1, S2 normal. Murmur present, rubs, or gallops.  ABDOMEN: Soft, NTND EXTREMITIES: Left lower extremity swelling without erythema.  Capillary refill < 3 seconds in all extremities. Pulses palpable distally.transmetatarsal amputation of left foot  NEUROLOGIC: No focal neurological deficit appreciated. Cranial nerves are intact.  SKIN: No obvious rash, lesion, or ulcer. Warm to touch  Labs/imaging that I havepersonally reviewed  (right click and "Reselect all SmartList Selections" daily)     Labs   CBC: Recent Labs  Lab 06/22/23 0055 06/22/23 0458 06/23/23 0656 06/24/23 0445 06/25/23 0645  WBC 10.8* 14.9* 15.5* 18.7* 15.5*  HGB 9.4* 9.8* 10.5* 11.2* 9.5*  HCT 27.8* 29.2* 30.5* 33.1* 28.4*  MCV 89.7 89.3 89.7 87.8 88.8  PLT 201 238 267 326 299    Basic Metabolic Panel: Recent Labs  Lab 06/21/23 1351 06/21/23 2304 06/22/23 0458 06/23/23 0656 06/24/23 0017 06/24/23 0445 06/25/23 0645  NA 124* 128* 126* 130*  --  132* 133*  K 4.2 4.2 4.2 3.6 3.5 3.6 4.4  CL 93* 98 99 105  --  104 109  CO2 24 25 19* 17*  --  21* 18*  GLUCOSE 237* 198* 258* 136*  --  253* 230*  BUN 39* 39* 37* 25*  --  19 18  CREATININE 1.29* 1.14* 1.24* 0.88  --  0.72 0.79  CALCIUM 8.0* 7.7* 7.5* 7.1*  --  7.5* 7.7*  MG 2.6*  --   --   --  1.9 2.0 1.9  PHOS 2.3*  --  3.9  --  2.0* 2.5 2.8   GFR: Estimated Creatinine Clearance: 69.1 mL/min (by C-G formula based on SCr of 0.79 mg/dL). Recent Labs  Lab  06/21/23 1351 06/21/23 1635 06/22/23 0055 06/22/23 0458 06/23/23 0656 06/24/23 0445 06/25/23 0645  PROCALCITON 12.39  --   --   --   --   --   --   WBC 18.8*  --    < > 14.9* 15.5* 18.7* 15.5*  LATICACIDVEN  --  1.1  --   --   --   --   --    < > = values in this interval not displayed.    Liver Function Tests: Recent Labs  Lab 06/22/23 0458 06/23/23 0656 06/24/23 0445 06/25/23 0645  AST 24 24 16   --   ALT 16 15 14   --   ALKPHOS 105 85 113  --   BILITOT 0.6 0.7 0.4  --   PROT 5.4* 5.0* 5.5*  --   ALBUMIN 2.4* 2.1* 2.2* 1.9*   No results for input(s): "LIPASE", "AMYLASE" in the last 168 hours. No results for input(s): "AMMONIA" in the last 168 hours.  ABG    Component Value Date/Time   PHART 7.36 11/01/2015 1716   PCO2ART 35 11/01/2015 1716   PO2ART 104 11/01/2015 1716   HCO3 19.8 (L) 11/01/2015 1716   ACIDBASEDEF 4.9 (H) 11/01/2015 1716   O2SAT 97.8  11/01/2015 1716     Coagulation Profile: Recent Labs  Lab 06/21/23 2304  INR 1.1    Cardiac Enzymes: Recent Labs  Lab 06/24/23 0445  CKTOTAL 34*    HbA1C: Hemoglobin A1C  Date/Time Value Ref Range Status  08/16/2013 03:39 PM 8.0 (H) 4.2 - 6.3 % Final    Comment:    The American Diabetes Association recommends that a primary goal of therapy should be <7% and that physicians should reevaluate the treatment regimen in patients with HbA1c values consistently >8%.   07/21/2011 04:20 AM 11.2 (H) 4.2 - 6.3 % Final    Comment:    The American Diabetes Association recommends that a primary goal of therapy should be <7% and that physicians should reevaluate the treatment regimen in patients with HbA1c values consistently >8%.    Hgb A1c MFr Bld  Date/Time Value Ref Range Status  01/27/2023 11:34 AM 8.0 (H) 4.6 - 6.5 % Final    Comment:    Glycemic Control Guidelines for People with Diabetes:Non Diabetic:  <6%Goal of Therapy: <7%Additional Action Suggested:  >8%   09/08/2022 10:44 AM 7.5 (H) 4.6 - 6.5 %  Final    Comment:    Glycemic Control Guidelines for People with Diabetes:Non Diabetic:  <6%Goal of Therapy: <7%Additional Action Suggested:  >8%     CBG: Recent Labs  Lab 06/24/23 1355 06/24/23 1558 06/24/23 2012 06/25/23 0300 06/25/23 0745  GLUCAP 204* 220* 280* 198* 229*    Review of Systems:    10 point ROS done and is negative except as per HPI  Past Medical History  She,  has a past medical history of Absence of kidney, Anxiety, Arthritis, Atherosclerosis of native arteries of extremities with intermittent claudication, bilateral legs (HCC), Bladder cancer (HCC), CHF (congestive heart failure) (HCC), Complication of anesthesia, Coronary artery disease, Diabetes mellitus, Family history of adverse reaction to anesthesia, GERD (gastroesophageal reflux disease), Heart murmur, Hemorrhoid, History of methicillin resistant staphylococcus aureus (MRSA) (2007), Hypertension, Neuropathy, PVD (peripheral vascular disease) (HCC), Thyroid nodule, Unspecified osteoarthritis, unspecified site, Urothelial carcinoma of kidney (HCC) (10/31/2014), Vitamin D deficiency, unspecified, and Wears dentures.   Surgical History    Past Surgical History:  Procedure Laterality Date   AMPUTATION TOE     right (4th and 5th); left (great toe, 3rd)   AMPUTATION TOE Right 07/16/2018   Procedure: AMPUTATION TOE/MPJ right 2nd;  Surgeon: Linus Galas, DPM;  Location: [facility];  Service: Podiatry;  Laterality: Right;   ARTERIAL BYPASS SURGRY  2009, 2013 x 2   right leg , done in [place]   CARDIAC CATHETERIZATION     CAROTID ENDARTERECTOMY Right 01/2014   Dr Gilda Crease   CATARACT EXTRACTION W/PHACO Right 12/14/2014   Procedure: CATARACT EXTRACTION PHACO AND INTRAOCULAR LENS PLACEMENT (IOC);  Surgeon: Lia Hopping, MD;  Location: [facility];  Service: Ophthalmology;  Laterality: Right;  [place]   00:38.6              AP        7.1                   CDE  2.76   CATARACT EXTRACTION W/PHACO Left 12/06/2019   Procedure:  CATARACT EXTRACTION PHACO AND INTRAOCULAR LENS PLACEMENT (IOC) LEFT DIABETIC;  Surgeon: Galen Manila, MD;  Location: [facility];  Service: Ophthalmology;  Laterality: Left;  9.08 1:06.4   CESAREAN SECTION     CHOLECYSTECTOMY  03-03-12   Porcelain gallbladder, gallstones,  [place]   COLONOSCOPY W/ BIOPSIES  04/28/2012   Hyperplastic rectal polyps.   COLONOSCOPY WITH PROPOFOL N/A 04/02/2022   Procedure: COLONOSCOPY WITH PROPOFOL;  Surgeon: Earline Mayotte, MD;  Location: [facility];  Service: Endoscopy;  Laterality: N/A;   CORONARY ARTERY BYPASS GRAFT  2009   3 vessel   CYSTOSCOPY W/ RETROGRADES Right 09/01/2016   Procedure: CYSTOSCOPY WITH RETROGRADE PYELOGRAM;  Surgeon: Vanna Scotland, MD;  Location: [facility];  Service: Urology;  Laterality: Right;   CYSTOSCOPY W/ RETROGRADES Bilateral 03/19/2020   Procedure: CYSTOSCOPY WITH RETROGRADE PYELOGRAM;  Surgeon: Vanna Scotland, MD;  Location: [facility];  Service: Urology;  Laterality: Bilateral;   CYSTOSCOPY WITH BIOPSY N/A 03/19/2020   Procedure: CYSTOSCOPY WITH BIOPSY;  Surgeon: Vanna Scotland, MD;  Location: [facility];  Service: Urology;  Laterality: N/A;   EYE SURGERY     FEMORAL-POPLITEAL BYPASS GRAFT Left 04/29/2023   Procedure: BYPASS GRAFT FEMORAL-POPLITEAL ARTERY;  Surgeon: Renford Dills, MD;  Location: [facility];  Service: Vascular;  Laterality: Left;   HERNIA REPAIR  10-31-14   ventral, retro-rectus atrium mesh   INCISION AND DRAINAGE ABSCESS Left 06/24/2023   Procedure: INCISION AND DRAINAGE WITH SATORIUS FLAP;  Surgeon: Renford Dills, MD;  Location: [facility];  Service: Vascular;  Laterality: Left;   IRRIGATION AND DEBRIDEMENT FOOT Left 01/18/2019   Procedure: IRRIGATION AND DEBRIDEMENT FOOT;  Surgeon: Linus Galas, DPM;  Location: [facility];  Service: Podiatry;  Laterality: Left;   LOWER EXTREMITY ANGIOGRAPHY Left 12/10/2016   Procedure: Lower Extremity Angiography;  Surgeon: Renford Dills, MD;  Location:  [facility];  Service: Cardiovascular;  Laterality: Left;   LOWER EXTREMITY ANGIOGRAPHY Left 02/02/2018   Procedure: LOWER EXTREMITY ANGIOGRAPHY;  Surgeon: Renford Dills, MD;  Location: [facility];  Service: Cardiovascular;  Laterality: Left;   LOWER EXTREMITY ANGIOGRAPHY Left 05/05/2018   Procedure: LOWER EXTREMITY ANGIOGRAPHY;  Surgeon: Renford Dills, MD;  Location: [facility];  Service: Cardiovascular;  Laterality: Left;   LOWER EXTREMITY ANGIOGRAPHY Left 12/04/2020   Procedure: LOWER EXTREMITY ANGIOGRAPHY with Intervention;  Surgeon: Renford Dills, MD;  Location: [facility];  Service: Cardiovascular;  Laterality: Left;   LOWER EXTREMITY ANGIOGRAPHY Left 04/24/2023   Procedure: Lower Extremity Angiography;  Surgeon: Renford Dills, MD;  Location: [facility];  Service: Cardiovascular;  Laterality: Left;   LOWER EXTREMITY ANGIOGRAPHY Left 05/01/2023   Procedure: Lower Extremity Angiography;  Surgeon: Renford Dills, MD;  Location: [facility];  Service: Cardiovascular;  Laterality: Left;   NEPHRECTOMY Left 10-31-14   PERIPHERAL VASCULAR CATHETERIZATION Left 05/01/2015   Procedure: Lower Extremity Angiography;  Surgeon: Renford Dills, MD;  Location: [facility];  Service: Cardiovascular;  Laterality: Left;   PERIPHERAL VASCULAR CATHETERIZATION  05/01/2015   Procedure: Lower Extremity Intervention;  Surgeon: Renford Dills, MD;  Location: [facility];  Service: Cardiovascular;;   PERIPHERAL VASCULAR CATHETERIZATION Left 02/20/2015   Procedure: Pelvic Angiography;  Surgeon: Renford Dills, MD;  Location: [facility];  Service: Cardiovascular;  Laterality: Left;   TRANSMETATARSAL AMPUTATION Left 05/05/2023   Procedure: TRANSMETATARSAL AMPUTATION LEFT;  Surgeon: Gwyneth Revels, DPM;  Location: [facility];  Service: Orthopedics/Podiatry;  Laterality: Left;   TRANSURETHRAL RESECTION OF  BLADDER TUMOR WITH MITOMYCIN-C N/A 09/01/2016   Procedure: TRANSURETHRAL RESECTION OF BLADDER TUMOR WITH MITOMYCIN-C;  Surgeon: Vanna Scotland, MD;  Location: [facility];  Service: Urology;  Laterality: N/A;     Social History   reports  that she quit smoking about 10 years ago. Her smoking use included cigarettes. She started smoking about 45 years ago. She has a 70 pack-year smoking history. She has never used smokeless tobacco. She reports that she does not currently use alcohol. She reports that she does not currently use drugs after having used the following drugs: Cocaine.   Family History   Her family history includes Breast cancer in her maternal grandmother; Cancer (age of onset: 19) in her mother; Cancer (age of onset: 66) in her father; Diabetes in her son. There is no history of Kidney cancer, Bladder Cancer, or Prostate cancer.   Allergies No Known Allergies   Home Medications  Prior to Admission medications   Medication Sig Start Date End Date Taking? Authorizing Provider  acetaminophen (TYLENOL) 500 MG tablet Take 1,000 mg by mouth every 6 (six) hours as needed for mild pain or headache.    Yes [provider]  apixaban (ELIQUIS) 2.5 MG TABS tablet Take 1 tablet (2.5 mg total) by mouth 2 (two) times daily. 05/11/23  Yes Pace, Huel Cote R, NP  aspirin 81 MG chewable tablet Chew 81 mg by mouth at bedtime.    Yes [provider]  Cholecalciferol (VITAMIN D3) 2000 UNITS TABS Take 2,000 Units by mouth daily after supper.    Yes [provider]  clopidogrel (PLAVIX) 75 MG tablet Take 1 tablet (75 mg total) by mouth daily at 6 (six) AM. 05/12/23  Yes Pace, Brien R, NP  empagliflozin (JARDIANCE) 25 MG TABS tablet TAKE 1 TABLET(25 MG) BY MOUTH DAILY BEFORE AND BREAKFAST 09/08/22  Yes Sherlene Shams, MD  gabapentin (NEURONTIN) 300 MG capsule Take 300 mg by mouth 2 (two) times daily.   Yes [provider]  insulin degludec (TRESIBA FLEXTOUCH) 100 UNIT/ML FlexTouch  Pen Inject 20 Units into the skin daily. Patient taking differently: Inject 22 Units into the skin daily. 09/08/22  Yes Sherlene Shams, MD  insulin lispro (HUMALOG KWIKPEN) 100 UNIT/ML KwikPen ADMINISTER 10 UNITS UNDER THE SKIN THREE TIMES DAILY BEFORE MEALS Patient taking differently: ADMINISTER 10 UNITS UNDER THE SKIN THREE TIMES DAILY BEFORE MEALS PRN 12/28/21  Yes Worthy Rancher B, FNP  losartan (COZAAR) 50 MG tablet Take 50 mg by mouth daily.   Yes [provider]  Multiple Vitamin (MULTIVITAMIN) tablet Take 1 tablet by mouth daily.   Yes [provider]  triamcinolone ointment (KENALOG) 0.5 % Apply 1 Application topically 2 (two) times daily. 01/27/23  Yes Sherlene Shams, MD  ALPRAZolam Prudy Feeler) 0.5 MG tablet Take 1 tablet (0.5 mg total) by mouth 2 (two) times daily as needed for anxiety. Patient not taking: Reported on 06/21/2023 05/11/23   Marcie Bal, NP  atorvastatin (LIPITOR) 20 MG tablet TAKE 1 TABLET(20 MG) BY MOUTH DAILY 01/22/23   Sherlene Shams, MD  Continuous Glucose Sensor (FREESTYLE LIBRE 2 SENSOR) MISC Use to check sugar at least 4 times daily 05/05/23   Sherlene Shams, MD  glucose blood test strip Use you check blood sugars up to four times daily. 03/25/22   Sherlene Shams, MD  Insulin Pen Needle (PEN NEEDLES) 32G X 4 MM MISC Use to take insulin daily 03/31/23   Sherlene Shams, MD  multivitamin-lutein [facility]) CAPS capsule Take 1 capsule by mouth daily. Patient not taking: Reported on 06/21/2023    [provider]  Scheduled Meds:  aspirin  81 mg Oral QHS   atorvastatin  20 mg Oral Daily   Chlorhexidine  Gluconate Cloth  6 each Topical Daily   cholecalciferol  2,000 Units Oral Daily   gabapentin  300 mg Oral BID   insulin aspart  0-15 Units Subcutaneous TID AC & HS   insulin glargine-yfgn  17 Units Subcutaneous QHS   midodrine  10 mg Oral TID WC   multivitamin with minerals  1 tablet Oral Daily   sodium chloride  1 g Oral BID WC    Continuous Infusions:  sodium chloride 75 mL/hr at 06/25/23 0644   amiodarone Stopped (06/25/23 1003)   ceFEPime (MAXIPIME) IV 2 g (06/25/23 0959)   DAPTOmycin Stopped (06/24/23 1528)   phenylephrine (NEO-SYNEPHRINE) Adult infusion 20 mcg/min (06/25/23 0930)   PRN Meds:.acetaminophen, ALPRAZolam, hydrALAZINE, ondansetron (ZOFRAN) IV, oxyCODONE-acetaminophen   Active Hospital Problem list   See systems below  Assessment & Plan:  #Sepsis with septic shock - present on admission - due to left groin abscess -  -F/u cultures, trend lactic/ PCT -GI panel and C. difficile pending -Monitor WBC/ fever curve -Continueantimicrobials as per ID -IVF hydration as needed -Pressors for MAP goal >65 -Strict I/O's  #PAD s/p of left common femoral-distal bypass, thrombectomy, angioplasty and stent placement 04/2023  on aspirin, Plavix and Eliquis   -[place] negative for DVT.  Left leg arterial duplex showed 10.6 cm complex collection concerning for seroma versus hematoma -Follow-up CT abdomen/pelvis concerning for abscess and possible splenic infarct. Discussed with vascular surgeon Dr. Wallace Cullens will evaluate in the morning -Continue aspirin Plavix -Hold Eliquis and continue IV heparin   #Acute Kidney Injury likely ATN #Hyponatremia PMHx: bladder cancer (s/p of nephrectomy  -Monitor I&O's / urinary output -Follow BMP -Check TSH and free T4 -Ensure adequate renal perfusion -Avoid nephrotoxic agents as able -Replace electrolytes as indicated   #Chronic dCHF PMHx: HTN, HLD Echo 12/2021: normal LVEF  -BNP>557.6, no evidence of volume overload -Hold GDMT for for now -Continue atorvastatin, aspirin and Plavix -Obtain 2D echocardiogram  #T2DM -Check hemoglobin A1c -CBG's q4; Target range of 140 to 180 -SSI -Start basal insulin once able to tolerate p.o. -Follow ICU Hypo/Hyperglycemia protocol      Best practice:  Diet:  Oral Pain/Anxiety/Delirium protocol (if indicated): No VAP protocol  (if indicated): Not indicated DVT prophylaxis: Systemic AC GI prophylaxis: N/A Glucose control:  SSI Yes Central venous access:  N/A Arterial line:  N/A Foley:  N/A Mobility:  bed rest  PT consulted: N/A Last date of multidisciplinary goals of care discussion [12/16] Code Status:  full code Disposition: ICU   = Goals of Care = Code Status Order: FULL  Primary Emergency Contact: Dent,Sharon, Home Phone: [phone] Wishes to pursue full aggressive treatment and intervention options, including CPR and intubation, but goals of care will be addressed on going with family if that should become necessary.   Critical care provider statement:   Total critical care time: 33 minutes   Performed by: Karna Christmas MD   Critical care time was exclusive of separately billable procedures and treating other patients.   Critical care was necessary to treat or prevent imminent or life-threatening deterioration.   Critical care was time spent personally by me on the following activities: development of treatment plan with patient and/or surrogate as well as nursing, discussions with consultants, evaluation of patient's response to treatment, examination of patient, obtaining history from patient or surrogate, ordering and performing treatments and interventions, ordering and review of laboratory studies, ordering and review of radiographic studies, pulse oximetry and re-evaluation of patient's condition.    Vida Rigger, M.D.  Pulmonary & Critical Care Medicine

## 2023-06-26 ENCOUNTER — Encounter: Payer: Self-pay | Admitting: Internal Medicine

## 2023-06-26 ENCOUNTER — Other Ambulatory Visit: Payer: Self-pay

## 2023-06-26 ENCOUNTER — Encounter
Admission: EM | Disposition: A | Discharge status: Home Health | Payer: Self-pay | Source: Home / Self Care | Attending: Internal Medicine

## 2023-06-26 ENCOUNTER — Inpatient Hospital Stay: Payer: 59 | Admitting: Anesthesiology

## 2023-06-26 DIAGNOSIS — L02214 Cutaneous abscess of groin: Secondary | ICD-10-CM | POA: Diagnosis present

## 2023-06-26 DIAGNOSIS — I70203 Unspecified atherosclerosis of native arteries of extremities, bilateral legs: Secondary | ICD-10-CM | POA: Diagnosis not present

## 2023-06-26 DIAGNOSIS — Z9889 Other specified postprocedural states: Secondary | ICD-10-CM | POA: Diagnosis not present

## 2023-06-26 DIAGNOSIS — T8149XA Infection following a procedure, other surgical site, initial encounter: Secondary | ICD-10-CM | POA: Diagnosis not present

## 2023-06-26 DIAGNOSIS — T827XXA Infection and inflammatory reaction due to other cardiac and vascular devices, implants and grafts, initial encounter: Secondary | ICD-10-CM | POA: Diagnosis not present

## 2023-06-26 DIAGNOSIS — B9620 Unspecified Escherichia coli [E. coli] as the cause of diseases classified elsewhere: Secondary | ICD-10-CM | POA: Diagnosis not present

## 2023-06-26 DIAGNOSIS — I97648 Postprocedural seroma of a circulatory system organ or structure following other circulatory system procedure: Secondary | ICD-10-CM | POA: Diagnosis not present

## 2023-06-26 DIAGNOSIS — I739 Peripheral vascular disease, unspecified: Secondary | ICD-10-CM | POA: Diagnosis not present

## 2023-06-26 HISTORY — DX: Cutaneous abscess of groin: L02.214

## 2023-06-26 HISTORY — PX: APPLICATION OF WOUND VAC: SHX5189

## 2023-06-26 LAB — CBC
HCT: 27.2 % — ABNORMAL LOW (ref 36.0–46.0)
Hemoglobin: 9.3 g/dL — ABNORMAL LOW (ref 12.0–15.0)
MCH: 29.6 pg (ref 26.0–34.0)
MCHC: 34.2 g/dL (ref 30.0–36.0)
MCV: 86.6 fL (ref 80.0–100.0)
Platelets: 336 10*3/uL (ref 150–400)
RBC: 3.14 MIL/uL — ABNORMAL LOW (ref 3.87–5.11)
RDW: 14.1 % (ref 11.5–15.5)
WBC: 18.1 10*3/uL — ABNORMAL HIGH (ref 4.0–10.5)
nRBC: 0 % (ref 0.0–0.2)

## 2023-06-26 LAB — MAGNESIUM: Magnesium: 1.9 mg/dL (ref 1.7–2.4)

## 2023-06-26 LAB — RENAL FUNCTION PANEL
Albumin: 1.8 g/dL — ABNORMAL LOW (ref 3.5–5.0)
Anion gap: 5 (ref 5–15)
BUN: 19 mg/dL (ref 8–23)
CO2: 21 mmol/L — ABNORMAL LOW (ref 22–32)
Calcium: 8.1 mg/dL — ABNORMAL LOW (ref 8.9–10.3)
Chloride: 111 mmol/L (ref 98–111)
Creatinine, Ser: 0.74 mg/dL (ref 0.44–1.00)
GFR, Estimated: >60 mL/min (ref >60)
Glucose, Bld: 137 mg/dL — ABNORMAL HIGH (ref 70–99)
Phosphorus: 2.4 mg/dL — ABNORMAL LOW (ref 2.5–4.6)
Potassium: 4.4 mmol/L (ref 3.5–5.1)
Sodium: 137 mmol/L (ref 135–145)

## 2023-06-26 LAB — GLUCOSE, CAPILLARY
Glucose-Capillary: 122 mg/dL — ABNORMAL HIGH (ref 70–99)
Glucose-Capillary: 115 mg/dL — ABNORMAL HIGH (ref 70–99)
Glucose-Capillary: 136 mg/dL — ABNORMAL HIGH (ref 70–99)
Glucose-Capillary: 134 mg/dL — ABNORMAL HIGH (ref 70–99)
Glucose-Capillary: 117 mg/dL — ABNORMAL HIGH (ref 70–99)

## 2023-06-26 LAB — HEPARIN LEVEL (UNFRACTIONATED): Heparin Unfractionated: 0.23 [IU]/mL — ABNORMAL LOW (ref 0.30–0.70)

## 2023-06-26 LAB — CULTURE, BLOOD (ROUTINE X 2): Culture: NO GROWTH

## 2023-06-26 SURGERY — APPLICATION, WOUND VAC
Anesthesia: General | Laterality: Left

## 2023-06-26 MED ORDER — MIDAZOLAM HCL 2 MG/2ML IJ SOLN
INTRAMUSCULAR | Status: DC | PRN
  Administered 2023-06-26: 2 mg via INTRAVENOUS

## 2023-06-26 MED ORDER — HEPARIN BOLUS VIA INFUSION
950.0000 [IU] | Freq: Once | INTRAVENOUS | Status: DC
  Filled 2023-06-26: qty 950

## 2023-06-26 MED ORDER — SENNA 8.6 MG PO TABS
1.0000 | ORAL_TABLET | Freq: Every day | ORAL | Status: DC
Start: 2023-06-26 — End: 2023-07-22
  Administered 2023-06-27 – 2023-07-18 (×18): 8.6 mg via ORAL
  Filled 2023-06-26 (×25): qty 1

## 2023-06-26 MED ORDER — HEPARIN (PORCINE) 25000 UT/250ML-% IV SOLN
1500.0000 [IU]/h | INTRAVENOUS | Status: DC
  Administered 2023-06-26: 1400 [IU]/h via INTRAVENOUS
  Administered 2023-06-28 – 2023-06-29 (×2): 1500 [IU]/h via INTRAVENOUS
  Filled 2023-06-26 (×4): qty 250

## 2023-06-26 MED ORDER — 0.9 % SODIUM CHLORIDE (POUR BTL) OPTIME
TOPICAL | Status: DC | PRN
  Administered 2023-06-26: 500 mL

## 2023-06-26 MED ORDER — ONDANSETRON HCL 4 MG/2ML IJ SOLN
INTRAMUSCULAR | Status: DC | PRN
  Administered 2023-06-26: 4 mg via INTRAVENOUS

## 2023-06-26 MED ORDER — PHENYLEPHRINE 80 MCG/ML (10ML) SYRINGE FOR IV PUSH (FOR BLOOD PRESSURE SUPPORT)
PREFILLED_SYRINGE | INTRAVENOUS | Status: DC | PRN
  Administered 2023-06-26: 160 ug via INTRAVENOUS
  Administered 2023-06-26: 80 ug via INTRAVENOUS

## 2023-06-26 MED ORDER — PROPOFOL 10 MG/ML IV BOLUS
INTRAVENOUS | Status: DC | PRN
  Administered 2023-06-26: 50 mg via INTRAVENOUS

## 2023-06-26 MED ORDER — ALBUMIN HUMAN 25 % IV SOLN
25.0000 g | Freq: Once | INTRAVENOUS | Status: AC
  Administered 2023-06-26: 25 g via INTRAVENOUS
  Filled 2023-06-26: qty 100

## 2023-06-26 MED ORDER — MIDAZOLAM HCL 2 MG/2ML IJ SOLN
INTRAMUSCULAR | Status: AC
  Filled 2023-06-26: qty 2

## 2023-06-26 MED ORDER — MAGNESIUM SULFATE 2 GM/50ML IV SOLN
2.0000 g | Freq: Once | INTRAVENOUS | Status: AC
  Administered 2023-06-26: 2 g via INTRAVENOUS
  Filled 2023-06-26: qty 50

## 2023-06-26 MED ORDER — PROPOFOL 500 MG/50ML IV EMUL
INTRAVENOUS | Status: DC | PRN
  Administered 2023-06-26: 150 ug/kg/min via INTRAVENOUS

## 2023-06-26 MED ORDER — SODIUM CHLORIDE 0.9 % IV SOLN
3.0000 g | Freq: Four times a day (QID) | INTRAVENOUS | Status: AC
  Administered 2023-06-26 – 2023-07-22 (×101): 3 g via INTRAVENOUS
  Filled 2023-06-26 (×109): qty 8

## 2023-06-26 MED ORDER — VASOPRESSIN 20 UNIT/ML IV SOLN
INTRAVENOUS | Status: DC | PRN
  Administered 2023-06-26: 4 [IU] via INTRAVENOUS

## 2023-06-26 MED ORDER — GLYCOPYRROLATE 0.2 MG/ML IJ SOLN
INTRAMUSCULAR | Status: DC | PRN
  Administered 2023-06-26: .2 mg via INTRAVENOUS

## 2023-06-26 MED ORDER — DEXAMETHASONE SODIUM PHOSPHATE 10 MG/ML IJ SOLN
INTRAMUSCULAR | Status: DC | PRN
  Administered 2023-06-26: 5 mg via INTRAVENOUS

## 2023-06-26 MED ORDER — FENTANYL CITRATE (PF) 100 MCG/2ML IJ SOLN
INTRAMUSCULAR | Status: AC
  Filled 2023-06-26: qty 2

## 2023-06-26 SURGICAL SUPPLY — 15 items
CANISTER WOUND CARE 500ML ATS (WOUND CARE) IMPLANT
DRAPE INCISE IOBAN 66X45 STRL (DRAPES) IMPLANT
DRSG VAC GRANUFOAM LG (GAUZE/BANDAGES/DRESSINGS) IMPLANT
GLOVE BIO SURGEON STRL SZ7 (GLOVE) ×1 IMPLANT
GLOVE SURG SYN 8.0 (GLOVE) ×1 IMPLANT
GLOVE SURG SYN 8.0 PF PI (GLOVE) ×1 IMPLANT
GOWN STRL REUS W/ TWL LRG LVL3 (GOWN DISPOSABLE) ×2 IMPLANT
GOWN STRL REUS W/ TWL XL LVL3 (GOWN DISPOSABLE) ×1 IMPLANT
KIT TURNOVER KIT A (KITS) ×1 IMPLANT
LABEL OR SOLS (LABEL) ×1 IMPLANT
MANIFOLD NEPTUNE II (INSTRUMENTS) ×1 IMPLANT
NS IRRIG 500ML POUR BTL (IV SOLUTION) ×1 IMPLANT
PACK EXTREMITY ARMC (MISCELLANEOUS) ×1 IMPLANT
SOL PREP PVP 2OZ (MISCELLANEOUS) ×3
SOLUTION PREP PVP 2OZ (MISCELLANEOUS) ×1 IMPLANT

## 2023-06-26 NOTE — Progress Notes (Signed)
PHARMACY - ANTICOAGULATION CONSULT NOTE  Pharmacy Consult for heparin infusion Indication: atrial fibrillation  No Known Allergies  Patient Measurements: Height: 5\' 7"  (170.2 cm) Weight: 64.1 kg (141 lb 6.4 oz) IBW/kg (Calculated) : 61.6 Heparin Dosing Weight: 64.1 kg  Vital Signs: Temp: 97.9 F (36.6 C) (12/20 0400) Temp Source: Oral (12/20 0400) BP: 119/43 (12/20 0400) Pulse Rate: 73 (12/20 0400)  Labs: Recent Labs    06/23/23 0656 06/24/23 0445 06/25/23 0645 06/25/23 1954 06/26/23 0419  HGB 10.5* 11.2* 9.5*  --  9.3*  HCT 30.5* 33.1* 28.4*  --  27.2*  PLT 267 326 299  --  336  APTT  --   --   --  46*  --   HEPARINUNFRC  --   --   --   --  0.23*  CREATININE 0.88 0.72 0.79  --   --   CKTOTAL  --  34*  --   --   --     Estimated Creatinine Clearance: 69.1 mL/min (by C-G formula based on SCr of 0.79 mg/dL).   Medical History: Past Medical History:  Diagnosis Date   Absence of kidney    left   Anxiety    Arthritis    Atherosclerosis of native arteries of extremities with intermittent claudication, bilateral legs (HCC)    Bladder cancer (HCC)    CHF (congestive heart failure) (HCC)    Complication of anesthesia    BP HAS  RUN LOW AFTER SURGERY-LUNGS FILLED UP WITH FLUID AFTER  LEG STENT SURGERY    Coronary artery disease    Diabetes mellitus    Family history of adverse reaction to anesthesia    Sister - PONV   GERD (gastroesophageal reflux disease)    OCC TUMS   Heart murmur    Hemorrhoid    History of methicillin resistant staphylococcus aureus (MRSA) 2007   Hypertension    Neuropathy    PVD (peripheral vascular disease) (HCC)    Thyroid nodule    right   Unspecified osteoarthritis, unspecified site    Urothelial carcinoma of kidney (HCC) 10/31/2014   INVASIVE UROTHELIAL CARCINOMA, LOW GRADE. T1, Nx.   Vitamin D deficiency, unspecified    Wears dentures    full upper and lower    Medications:  Scheduled:   aspirin  81 mg Oral QHS    atorvastatin  20 mg Oral Daily   chlorhexidine  60 mL Topical Once   Chlorhexidine Gluconate Cloth  6 each Topical Daily   cholecalciferol  2,000 Units Oral Daily   gabapentin  300 mg Oral BID   heparin  950 Units Intravenous Once   insulin aspart  0-15 Units Subcutaneous TID AC & HS   insulin aspart  3 Units Subcutaneous TID WC   insulin glargine-yfgn  19 Units Subcutaneous QHS   midodrine  10 mg Oral TID WC   multivitamin with minerals  1 tablet Oral Daily   sodium chloride  1 g Oral BID WC    Assessment: 62 y.o female with significant PMH of HTN, HLD, DM, sCHF, CAD, PAD (s/p of left common femoral-distal bypass, thrombectomy, angioplasty and stent placement 04/2023 on aspirin, Plavix and Eliquis), anxiety, bladder cancer (s/p of nephrectomy) who presented to the ED with chief complaints of left groin mass. Noted apixaban prior to arrival with last dose documented 06/20/23 pm  Baseline Labs: INR 1.1, aPTT 35s, H&H/PLT trending down slightly  Date/Time  aPTT  Rate  Comment 12/19 1954 46 1000 units/hr Subtherapeutic  Goal of Therapy:  Heparin level 0.3-0.7 units/ml aPTT 66 - 102 seconds Monitor platelets by anticoagulation protocol: Yes   Plan:  12/20:  HL @ 0419 = 0.23,  SUBtherapeutic - RN states heparin gtt was d/c'd @ 0440 b/c pt is going for "wash out" later today @ 1100. - HL was SUBtherapeutic prior to heparin being d/c'd, last dose of Eliquis was 12/14 PM so does not appear to be exerting effect - Can use HL to guide dosing from here on, will probably need to use higher rate (1400 units/hr)  - Need to f/u plans for anticoag after procedure  Nichole Keltner D, PharmD Clinical Pharmacist 06/26/2023,5:04 AM

## 2023-06-26 NOTE — Anesthesia Procedure Notes (Signed)
Procedure Name: General with mask airway Date/Time: 06/26/2023 12:53 PM  Performed by: Mohammed Kindle, CRNAPre-anesthesia Checklist: Patient identified, Emergency Drugs available, Suction available and Patient being monitored Patient Re-evaluated:Patient Re-evaluated prior to induction Oxygen Delivery Method: Simple face mask Induction Type: IV induction Placement Confirmation: positive ETCO2, CO2 detector and breath sounds checked- equal and bilateral Dental Injury: Teeth and Oropharynx as per pre-operative assessment

## 2023-06-26 NOTE — Progress Notes (Addendum)
Progress Note   Patient: Laura Reed EAV:[id number] DOB: [birth year] DOA: 06/21/2023     5 DOS: the patient was seen and examined on 06/26/2023   Brief hospital course: 62 y.o female with significant PMH of HTN, HLD, DM, sCHF, CAD, PAD (s/p of left common femoral-distal bypass, thrombectomy, angioplasty and stent placement 04/2023 on aspirin, Plavix and Eliquis), anxiety, bladder cancer (s/p of nephrectomy) who presented to the ED with chief complaints of    Patient presented to [facility] urgent care on 06/21/23 with complaints of right groin pain, redness and oozing post op. Patient has hx of PAD and recently underwent left common femoral-distal bypass, thrombectomy, angioplasty and stent placement 04/2023  and right transmetatarsal amputation. States that she has been having left leg swelling. No weeping. Some pitting. She thinks she has been having fever. She was noted to be hypotensive with SBP in the 50s and was sent to the ED for further evaluation.  Patient required vasopressors 12/16.  She went to the OR 12/18 and 200 cc of pus were drained from her left groin.  Her blood pressure stabilized off of pressors and she was transferred to stepdown unit.  Infectious disease was consulted and patient was continued on antibiotics.  Assessment and Plan: Sepsis with septic shock resolved Left groin abscess Continue antimicrobials per ID, currently on Unasyn Pain control  PAD s/p of left common femoral-distal bypass, thrombectomy, angioplasty and stent placement 04/2023  on aspirin, Plavix and Eliquis  -Currently on heparin infusion   Leukocytosis  In the setting of her infection. Continue to monitor s/p OR and with antibiotics.   AKI     Latest Ref Rng & Units 06/26/2023    4:19 AM 06/25/2023    6:45 AM 06/24/2023    4:45 AM  BMP  Glucose 70 - 99 mg/dL 782  956  213   BUN 8 - 23 mg/dL 19  18  19    Creatinine 0.44 - 1.00 mg/dL 0.86  5.78  4.69   Sodium 135 - 145 mmol/L 137  133  132    Potassium 3.5 - 5.1 mmol/L 4.4  4.4  3.6   Chloride 98 - 111 mmol/L 111  109  104   CO2 22 - 32 mmol/L 21  18  21    Calcium 8.9 - 10.3 mg/dL 8.1  7.7  7.5    Resolved    Hyponatremia  Resolved   Chronic diastolic HF No evidence of volume overload   HTN  HLD Continue statin Hold home antihypertensives. Decrease midodrine in the AM  T2DM  Poorly controlled  A1c 8 [place] 10u + mealtime 3u TID, SSI       Subjective:  No acute complaints. Denies chest pain or shortness of breath. Has some discomfort in her L groin.   Physical Exam: Vitals:   06/26/23 1500 06/26/23 1600 06/26/23 1700 06/26/23 1800  BP: (!) 113/96 (!) 65/47 (!) 124/92 (!) 141/63  Pulse: 94 (!) 136 (!) 38 (!) 37  Resp: 12 16 17 13   Temp: 98.3 F (36.8 C)  98.3 F (36.8 C)   TempSrc: Oral  Oral Oral  SpO2: 99% (!) 84% (!) 89% 100%  Weight:      Height:       Physical Exam  Constitutional: and in no distress.  Cardiovascular: Normal rate, regular rhythm, 2+ edema of LLE, trace RLE edema   Pulmonary: Non labored breathing on room air, no wheezing or rales  Abdominal: Soft. Normal bowel sounds. Non distended  and non tender Musculoskeletal: s/p L TMA , L groin wound vac in place, no surrounding erythema holding seal appropriately  Neurological: Alert and oriented to person, place, and time. Non focal  Skin: Skin is warm and dry.   Data Reviewed:  I have reviewed all labs and images.     Latest Ref Rng & Units 06/26/2023    4:19 AM 06/25/2023    6:45 AM 06/24/2023    4:45 AM  CBC  WBC 4.0 - 10.5 K/uL 18.1  15.5  18.7   Hemoglobin 12.0 - 15.0 g/dL 9.3  9.5  40.9   Hematocrit 36.0 - 46.0 % 27.2  28.4  33.1   Platelets 150 - 400 K/uL 336  299  326       Latest Ref Rng & Units 06/26/2023    4:19 AM 06/25/2023    6:45 AM 06/24/2023    4:45 AM  BMP  Glucose 70 - 99 mg/dL 811  914  782   BUN 8 - 23 mg/dL 19  18  19    Creatinine 0.44 - 1.00 mg/dL 9.56  2.13  0.86   Sodium 135 - 145 mmol/L  137  133  132   Potassium 3.5 - 5.1 mmol/L 4.4  4.4  3.6   Chloride 98 - 111 mmol/L 111  109  104   CO2 22 - 32 mmol/L 21  18  21    Calcium 8.9 - 10.3 mg/dL 8.1  7.7  7.5     Disposition: Status is: Inpatient Remains inpatient appropriate because: LLE abscess requiring OR washout  Planned Discharge Destination:  Pending    Heparin infusion  Time spent: 35 minutes  Author: Marolyn Haller, MD 06/26/2023 6:48 PM  For on call review [email or web address].

## 2023-06-26 NOTE — Progress Notes (Signed)
[facility]  Daily Progress Note   Subjective  - * Day of Surgery *  She is postoperative day #2 status post drainage of left groin abscess and debridement of the wound with rotation of the sartorius muscle flap and VAC dressing placement.  She notes that her leg is actually feeling much better.  She has improved in her hemodynamics and is off pressors.  Objective Vitals:   06/26/23 0707 06/26/23 0800 06/26/23 0802 06/26/23 1036  BP: (!) 131/109 (!) 58/48 (!) 120/44 121/70  Pulse: 91 (!) 48 (!) 32 66  Resp: 16 15 17 18   Temp:  97.8 F (36.6 C)    TempSrc:  Oral    SpO2: 99% 91% 100% 97%  Weight:    74.5 kg  Height:    5\' 7"  (1.702 m)    Intake/Output Summary (Last 24 hours) at 06/26/2023 1235 Last data filed at 06/26/2023 0800 Gross per 24 hour  Intake 1367.6 ml  Output 1850 ml  Net -482.4 ml    PULM  Normal effort , no use of accessory muscles CV  No JVD, RRR Abd      No distended, nontender VASC  VAC dressing clean dry and intact  Laboratory CBC    Component Value Date/Time   WBC 18.1 (H) 06/26/2023 0419   HGB 9.3 (L) 06/26/2023 0419   HGB 9.5 (L) 11/02/2014 0609   HCT 27.2 (L) 06/26/2023 0419   HCT 29.5 (L) 11/02/2014 0609   PLT 336 06/26/2023 0419   PLT 217 11/02/2014 0609    BMET    Component Value Date/Time   NA 137 06/26/2023 0419   NA 136 08/02/2021 1629   NA 135 11/02/2014 0609   K 4.4 06/26/2023 0419   K 4.2 11/02/2014 0609   CL 111 06/26/2023 0419   CL 107 11/02/2014 0609   CO2 21 (L) 06/26/2023 0419   CO2 23 11/02/2014 0609   GLUCOSE 137 (H) 06/26/2023 0419   GLUCOSE 108 (H) 11/02/2014 0609   BUN 19 06/26/2023 0419   BUN 25 08/02/2021 1629   BUN 20 11/02/2014 0609   CREATININE 0.74 06/26/2023 0419   CREATININE 1.01 11/09/2015 1549   CREATININE 1.01 11/09/2015 1549   CALCIUM 8.1 (L) 06/26/2023 0419   CALCIUM 7.3 (L) 11/02/2014 0609   GFRNONAA >60 06/26/2023 0419   GFRNONAA 50 (L) 11/02/2014 0609   GFRAA >60  01/19/2019 0357   GFRAA 58 (L) 11/02/2014 0609    Assessment/Planning: POD # 2 s/p drainage of abscess with rotation of sartorius muscle flap  Patient is improving.  The plan is for the initial VAC dressing change in the operating room.  We will move forward as her hemodynamics have improved and she is doing well.   [name]  06/26/2023, 12:35 PM

## 2023-06-26 NOTE — Progress Notes (Signed)
PHARMACY CONSULT NOTE - FOLLOW UP  Pharmacy Consult for Electrolyte Monitoring and Replacement   Recent Labs: Potassium (mmol/L)  Date Value  06/26/2023 4.4  11/02/2014 4.2   Magnesium (mg/dL)  Date Value  16/04/9603 1.9  06/03/2013 2.0   Calcium (mg/dL)  Date Value  54/03/8118 8.1 (L)   Calcium, Total (mg/dL)  Date Value  14/78/2956 7.3 (L)   Albumin (g/dL)  Date Value  21/30/8657 1.8 (L)  08/02/2021 4.3  06/03/2013 2.2 (L)   Phosphorus (mg/dL)  Date Value  84/69/6295 2.4 (L)   Sodium (mmol/L)  Date Value  06/26/2023 137  08/02/2021 136  11/02/2014 135   Corrected Ca: 9.4 mg/dL  Assessment: 62 y.o female with significant PMH of HTN, HLD, DM, sCHF, CAD, PAD (s/p of left common femoral-distal bypass, thrombectomy, angioplasty and stent placement 04/2023 on aspirin, Plavix and Eliquis), anxiety, bladder cancer (s/p of nephrectomy) who presented to the ED with chief complaints of left groin mass.   Goal of Therapy:  Electrolytes WNL  Plan:  ---no electrolyte replacement warranted for today ---recheck electrolytes in am  Lowella Bandy ,PharmD Clinical Pharmacist 06/26/2023 7:24 AM

## 2023-06-26 NOTE — Progress Notes (Signed)
OT Cancellation Note  Patient Details Name: SANTOYA HEMING MRN: [id number] DOB: 1961-01-13   Cancelled Treatment:    Reason Eval/Treat Not Completed: Patient declined, no reason specified. Chart reviewed, on arrival pt reports returning to bed and awaiting procedure. Requests therapy returns this afternoon for OOB attempts. Will return as able.   Kathie Dike, M.S. OTR/L  06/26/23, 10:20 AM  ascom [phone]

## 2023-06-26 NOTE — Plan of Care (Signed)
Continuing with plan of care. 

## 2023-06-26 NOTE — Progress Notes (Signed)
PHARMACY - ANTICOAGULATION CONSULT NOTE  Pharmacy Consult for heparin infusion Indication: atrial fibrillation  No Known Allergies  Patient Measurements: Height: 5\' 7"  (170.2 cm) Weight: 74.5 kg (164 lb 3.9 oz) IBW/kg (Calculated) : 61.6 Heparin Dosing Weight: 64.1 kg  Vital Signs: Temp: 98.4 F (36.9 C) (12/20 1355) Temp Source: Oral (12/20 0800) BP: 89/58 (12/20 1400) Pulse Rate: 34 (12/20 1415)  Labs: Recent Labs    06/24/23 0445 06/25/23 0645 06/25/23 1954 06/26/23 0419  HGB 11.2* 9.5*  --  9.3*  HCT 33.1* 28.4*  --  27.2*  PLT 326 299  --  336  APTT  --   --  46*  --   HEPARINUNFRC  --   --   --  0.23*  CREATININE 0.72 0.79  --  0.74  CKTOTAL 34*  --   --   --     Estimated Creatinine Clearance: 74.9 mL/min (by C-G formula based on SCr of 0.74 mg/dL).   Medical History: Past Medical History:  Diagnosis Date   Absence of kidney    left   Anxiety    Arthritis    Atherosclerosis of native arteries of extremities with intermittent claudication, bilateral legs (HCC)    Bladder cancer (HCC)    CHF (congestive heart failure) (HCC)    Complication of anesthesia    BP HAS  RUN LOW AFTER SURGERY-LUNGS FILLED UP WITH FLUID AFTER  LEG STENT SURGERY    Coronary artery disease    Diabetes mellitus    Family history of adverse reaction to anesthesia    Sister - PONV   GERD (gastroesophageal reflux disease)    OCC TUMS   Heart murmur    Hemorrhoid    History of methicillin resistant staphylococcus aureus (MRSA) 2007   Hypertension    Neuropathy    PVD (peripheral vascular disease) (HCC)    Thyroid nodule    right   Unspecified osteoarthritis, unspecified site    Urothelial carcinoma of kidney (HCC) 10/31/2014   INVASIVE UROTHELIAL CARCINOMA, LOW GRADE. T1, Nx.   Vitamin D deficiency, unspecified    Wears dentures    full upper and lower    Medications:  Scheduled:   aspirin  81 mg Oral QHS   atorvastatin  20 mg Oral Daily   Chlorhexidine Gluconate  Cloth  6 each Topical Daily   cholecalciferol  2,000 Units Oral Daily   gabapentin  300 mg Oral BID   insulin aspart  0-15 Units Subcutaneous TID AC & HS   insulin aspart  3 Units Subcutaneous TID WC   insulin glargine-yfgn  19 Units Subcutaneous QHS   midodrine  10 mg Oral TID WC   multivitamin with minerals  1 tablet Oral Daily   senna  1 tablet Oral Daily   sodium chloride  1 g Oral BID WC    Assessment: 62 y.o female with significant PMH of HTN, HLD, DM, sCHF, CAD, PAD (s/p of left common femoral-distal bypass, thrombectomy, angioplasty and stent placement 04/2023 on aspirin, Plavix and Eliquis), anxiety, bladder cancer (s/p of nephrectomy) who presented to the ED with chief complaints of left groin mass. Noted apixaban prior to arrival with last dose documented 06/20/23 pm  Baseline Labs: INR 1.1, aPTT 35s, H&H/PLT stable  Goal of Therapy:  Heparin level 0.3-0.7 units/ml Monitor platelets by anticoagulation protocol: Yes   Plan:  heparin gtt was stopped early this morning for a wound VAC change ---heparin level was SUBtherapeutic prior to heparin being stopped this morning ---  resume heparin (without bolus) at 1400 units/hr ---check heparin level in 8 hours after resuming ---CBC at least once daily while on IV heparin  Lowella Bandy, PharmD Clinical Pharmacist 06/26/2023,2:38 PM

## 2023-06-26 NOTE — Transfer of Care (Signed)
Immediate Anesthesia Transfer of Care Note  Patient: Laura Reed  Procedure(s) Performed: WOUND VAC EXCHANGE LEFT GROIN (Left)  Patient Location: PACU  Anesthesia Type:General  Level of Consciousness: awake, drowsy, and patient cooperative  Airway & Oxygen Therapy: Patient Spontanous Breathing  Post-op Assessment: Report given to RN and Post -op Vital signs reviewed and stable  Post vital signs: Reviewed and stable  Last Vitals:  Vitals Value Taken Time  BP    Temp    Pulse 34 06/26/23 1343  Resp    SpO2 91 % 06/26/23 1343  Vitals shown include unfiled device data.  Last Pain:  Vitals:   06/26/23 1036  TempSrc:   PainSc: 2       Patients Stated Pain Goal: 0 (06/26/23 1036)  Complications: No notable events documented.

## 2023-06-26 NOTE — H&P (View-Only) (Signed)
[facility]  Daily Progress Note   Subjective  - * Day of Surgery *  She is postoperative day #2 status post drainage of left groin abscess and debridement of the wound with rotation of the sartorius muscle flap and VAC dressing placement.  She notes that her leg is actually feeling much better.  She has improved in her hemodynamics and is off pressors.  Objective Vitals:   06/26/23 0707 06/26/23 0800 06/26/23 0802 06/26/23 1036  BP: (!) 131/109 (!) 58/48 (!) 120/44 121/70  Pulse: 91 (!) 48 (!) 32 66  Resp: 16 15 17 18   Temp:  97.8 F (36.6 C)    TempSrc:  Oral    SpO2: 99% 91% 100% 97%  Weight:    74.5 kg  Height:    5\' 7"  (1.702 m)    Intake/Output Summary (Last 24 hours) at 06/26/2023 1235 Last data filed at 06/26/2023 0800 Gross per 24 hour  Intake 1367.6 ml  Output 1850 ml  Net -482.4 ml    PULM  Normal effort , no use of accessory muscles CV  No JVD, RRR Abd      No distended, nontender VASC  VAC dressing clean dry and intact  Laboratory CBC    Component Value Date/Time   WBC 18.1 (H) 06/26/2023 0419   HGB 9.3 (L) 06/26/2023 0419   HGB 9.5 (L) 11/02/2014 0609   HCT 27.2 (L) 06/26/2023 0419   HCT 29.5 (L) 11/02/2014 0609   PLT 336 06/26/2023 0419   PLT 217 11/02/2014 0609    BMET    Component Value Date/Time   NA 137 06/26/2023 0419   NA 136 08/02/2021 1629   NA 135 11/02/2014 0609   K 4.4 06/26/2023 0419   K 4.2 11/02/2014 0609   CL 111 06/26/2023 0419   CL 107 11/02/2014 0609   CO2 21 (L) 06/26/2023 0419   CO2 23 11/02/2014 0609   GLUCOSE 137 (H) 06/26/2023 0419   GLUCOSE 108 (H) 11/02/2014 0609   BUN 19 06/26/2023 0419   BUN 25 08/02/2021 1629   BUN 20 11/02/2014 0609   CREATININE 0.74 06/26/2023 0419   CREATININE 1.01 11/09/2015 1549   CREATININE 1.01 11/09/2015 1549   CALCIUM 8.1 (L) 06/26/2023 0419   CALCIUM 7.3 (L) 11/02/2014 0609   GFRNONAA >60 06/26/2023 0419   GFRNONAA 50 (L) 11/02/2014 0609   GFRAA >60  01/19/2019 0357   GFRAA 58 (L) 11/02/2014 0609    Assessment/Planning: POD # 2 s/p drainage of abscess with rotation of sartorius muscle flap  Patient is improving.  The plan is for the initial VAC dressing change in the operating room.  We will move forward as her hemodynamics have improved and she is doing well.   [name]  06/26/2023, 12:35 PM

## 2023-06-26 NOTE — Progress Notes (Signed)
Patient returned to unit in stable condition; wound vac site checked with RN from procedural area, wound vac is in working order.

## 2023-06-26 NOTE — Progress Notes (Signed)
Patient transferred to special procedures area in hospital bed, with cardiac monitoring, this nurse, and OR transport.

## 2023-06-26 NOTE — TOC Progression Note (Addendum)
Transition of Care [facility]) - Progression Note    Patient Details  Name: Laura Reed MRN: [id number] Date of Birth: [birth year]  Transition of Care [facility]) CM/SW Contact  Liliana Cline, LCSW Phone Number: 06/26/2023, 9:42 AM  Clinical Narrative:    CSW spoke with patient at bedside. Patient states she is agreeable to Home Health if indicated. She states she had been home from [facility] for about 3 weeks prior to coming to the hospital. Patient states if her doctors feel she need sto go back to [facility], she would also be agreeable to that. If she needs home health, patient states she would like to try another agency other than [facility]. Patient states her sister Laura Reed is her support system and can be contacted for DC planning if needed.  Asked Kandee Keen with [facility] if they can accept patient for [facility].       Expected Discharge Plan and Services                                               Social Determinants of Health (SDOH) Interventions SDOH Screenings   Food Insecurity: No Food Insecurity (06/23/2023)  Recent Concern: Food Insecurity - Food Insecurity Present (05/26/2023)   Received from [facility]  Housing: Low Risk  (06/23/2023)  Transportation Needs: No Transportation Needs (06/23/2023)  Utilities: Not At Risk (06/23/2023)  Alcohol Screen: Low Risk  (06/17/2023)  Depression (PHQ2-9): Low Risk  (06/17/2023)  Financial Resource Strain: Low Risk  (06/21/2023)   Received from [facility]  Physical Activity: Inactive (06/17/2023)  Social Connections: Socially Isolated (06/17/2023)  Stress: Stress Concern Present (06/17/2023)  Tobacco Use: Medium Risk (06/24/2023)  Health Literacy: Inadequate Health Literacy (06/17/2023)    Readmission Risk Interventions    06/22/2023   11:01 AM  Readmission Risk Prevention Plan  Transportation Screening Complete  PCP or Specialist Appt within 3-5 Days Complete  Social Work  Consult for Recovery Care Planning/Counseling Complete  Palliative Care Screening Not Applicable  Medication Review [employer]) Complete

## 2023-06-26 NOTE — Plan of Care (Signed)
  Problem: Fluid Volume: Goal: Ability to maintain a balanced intake and output will improve Outcome: Progressing   Problem: Skin Integrity: Goal: Risk for impaired skin integrity will decrease Outcome: Progressing   Problem: Clinical Measurements: Goal: Cardiovascular complication will be avoided Outcome: Progressing   Problem: Activity: Goal: Risk for activity intolerance will decrease Outcome: Progressing   Problem: Elimination: Goal: Will not experience complications related to urinary retention Outcome: Progressing

## 2023-06-26 NOTE — Anesthesia Preprocedure Evaluation (Signed)
Anesthesia Evaluation  Patient identified by MRN, date of birth, ID band Patient awake    Reviewed: Allergy & Precautions, NPO status , Patient's Chart, lab work & pertinent test results  History of Anesthesia Complications Negative for: history of anesthetic complications  Airway Mallampati: III  TM Distance: >3 FB Neck ROM: full    Dental no notable dental hx. (+) Edentulous Upper, Edentulous Lower   Pulmonary neg COPD, neg recent URI, former smoker   Pulmonary exam normal breath sounds clear to auscultation       Cardiovascular hypertension, On Medications (-) angina + CAD, + CABG, + Peripheral Vascular Disease and +CHF  (-) Cardiac Stents + dysrhythmias (rate controlled) Atrial Fibrillation + Valvular Problems/Murmurs  Rhythm:Regular Rate:Normal - Systolic murmurs    Neuro/Psych neg Seizures PSYCHIATRIC DISORDERS Anxiety      Neuromuscular disease    GI/Hepatic Neg liver ROS,GERD  Controlled,,  Endo/Other  diabetes, Insulin Dependent    Renal/GU Renal disease     Musculoskeletal  (+) Arthritis ,    Abdominal   Peds  Hematology negative hematology ROS (+)   Anesthesia Other Findings Past Medical History: No date: Absence of kidney     Comment:  left No date: Anxiety No date: Arthritis No date: Bladder cancer (HCC) No date: CHF (congestive heart failure) (HCC) No date: Complication of anesthesia     Comment:  BP HAS  RUN LOW AFTER SURGERY-LUNGS FILLED UP WITH FLUID              AFTER  LEG STENT SURGERY  No date: Coronary artery disease No date: Diabetes mellitus No date: Family history of adverse reaction to anesthesia     Comment:  Sister - PONV No date: GERD (gastroesophageal reflux disease)     Comment:  OCC TUMS No date: Heart murmur No date: Hemorrhoid 2007: History of methicillin resistant staphylococcus aureus (MRSA) No date: Hypertension No date: Neuropathy No date: PVD (peripheral vascular  disease) (HCC) No date: Thyroid nodule     Comment:  right 10/31/2014: Urothelial carcinoma of kidney (HCC)     Comment:  INVASIVE UROTHELIAL CARCINOMA, LOW GRADE. T1, Nx. No date: Wears dentures     Comment:  full upper and lower  Past Surgical History: No date: AMPUTATION TOE     Comment:  right (4th and 5th); left (great toe, 3rd) 07/16/2018: AMPUTATION TOE; Right     Comment:  Procedure: AMPUTATION TOE/MPJ right 2nd;  Surgeon:               Linus Galas, DPM;  Location: [facility];  Service:               Podiatry;  Laterality: Right; 2009, 2013 x 2: ARTERIAL BYPASS SURGRY     Comment:  right leg , done in [place] No date: CARDIAC CATHETERIZATION 01/2014: CAROTID ENDARTERECTOMY; Right     Comment:  Dr Gilda Crease 12/14/2014: CATARACT EXTRACTION W/PHACO; Right     Comment:  Procedure: CATARACT EXTRACTION PHACO AND INTRAOCULAR               LENS PLACEMENT (IOC);  Surgeon: Lia Hopping, MD;                Location: [facility];  Service: Ophthalmology;  Laterality:              Right;  [place]   00:38.6              AP        7.1  CDE  2.76 12/06/2019: CATARACT EXTRACTION W/PHACO; Left     Comment:  Procedure: CATARACT EXTRACTION PHACO AND INTRAOCULAR               LENS PLACEMENT (IOC) LEFT DIABETIC;  Surgeon: Galen Manila, MD;  Location: [facility];  Service:               Ophthalmology;  Laterality: Left;  9.08 1:06.4 No date: CESAREAN SECTION 03-03-12: CHOLECYSTECTOMY     Comment:  Porcelain gallbladder, gallstones,  [place] 04/28/2012: COLONOSCOPY W/ BIOPSIES     Comment:  Hyperplastic rectal polyps. 04/02/2022: COLONOSCOPY WITH PROPOFOL; N/A     Comment:  Procedure: COLONOSCOPY WITH PROPOFOL;  Surgeon: Earline Mayotte, MD;  Location: [facility];  Service:               Endoscopy;  Laterality: N/A; 2009: CORONARY ARTERY BYPASS GRAFT     Comment:  3 vessel 09/01/2016: CYSTOSCOPY W/ RETROGRADES; Right     Comment:   Procedure: CYSTOSCOPY WITH RETROGRADE PYELOGRAM;                Surgeon: Vanna Scotland, MD;  Location: [facility];                Service: Urology;  Laterality: Right; 03/19/2020: CYSTOSCOPY W/ RETROGRADES; Bilateral     Comment:  Procedure: CYSTOSCOPY WITH RETROGRADE PYELOGRAM;                Surgeon: Vanna Scotland, MD;  Location: [facility];                Service: Urology;  Laterality: Bilateral; 03/19/2020: CYSTOSCOPY WITH BIOPSY; N/A     Comment:  Procedure: CYSTOSCOPY WITH BIOPSY;  Surgeon: Vanna Scotland, MD;  Location: [facility];  Service: Urology;                Laterality: N/A; No date: EYE SURGERY 10-31-14: HERNIA REPAIR     Comment:  ventral, retro-rectus atrium mesh 01/18/2019: IRRIGATION AND DEBRIDEMENT FOOT; Left     Comment:  Procedure: IRRIGATION AND DEBRIDEMENT FOOT;  Surgeon:               Linus Galas, DPM;  Location: [facility];  Service:               Podiatry;  Laterality: Left; 12/10/2016: LOWER EXTREMITY ANGIOGRAPHY; Left     Comment:  Procedure: Lower Extremity Angiography;  Surgeon:               Renford Dills, MD;  Location: [facility];               Service: Cardiovascular;  Laterality: Left; 02/02/2018: LOWER EXTREMITY ANGIOGRAPHY; Left     Comment:  Procedure: LOWER EXTREMITY ANGIOGRAPHY;  Surgeon:               Renford Dills, MD;  Location: [facility];               Service: Cardiovascular;  Laterality: Left; 05/05/2018: LOWER EXTREMITY ANGIOGRAPHY; Left     Comment:  Procedure: LOWER EXTREMITY ANGIOGRAPHY;  Surgeon:               Renford Dills, MD;  Location: [facility] IN[facility]  CV [facility];               Service: Cardiovascular;  Laterality: Left; 12/04/2020: LOWER EXTREMITY ANGIOGRAPHY; Left     Comment:  Procedure: LOWER EXTREMITY ANGIOGRAPHY with               Intervention;  Surgeon: Renford Dills, MD;                Location: [facility];  Service: Cardiovascular;              Laterality:  Left; 04/24/2023: LOWER EXTREMITY ANGIOGRAPHY; Left     Comment:  Procedure: Lower Extremity Angiography;  Surgeon:               Renford Dills, MD;  Location: [facility];               Service: Cardiovascular;  Laterality: Left; 10-31-14: NEPHRECTOMY; Left 05/01/2015: PERIPHERAL VASCULAR CATHETERIZATION; Left     Comment:  Procedure: Lower Extremity Angiography;  Surgeon:               Renford Dills, MD;  Location: [facility];                Service: Cardiovascular;  Laterality: Left; 05/01/2015: PERIPHERAL VASCULAR CATHETERIZATION     Comment:  Procedure: Lower Extremity Intervention;  Surgeon:               Renford Dills, MD;  Location: [facility];                Service: Cardiovascular;; 02/20/2015: PERIPHERAL VASCULAR CATHETERIZATION; Left     Comment:  Procedure: Pelvic Angiography;  Surgeon: Renford Dills, MD;  Location: [facility];  Service:               Cardiovascular;  Laterality: Left; 09/01/2016: TRANSURETHRAL RESECTION OF BLADDER TUMOR WITH MITOMYCIN-C;  N/A     Comment:  Procedure: TRANSURETHRAL RESECTION OF BLADDER TUMOR WITH              MITOMYCIN-C;  Surgeon: Vanna Scotland, MD;  Location:               [facility];  Service: Urology;  Laterality: N/A;  BMI    Body Mass Index: 21.13 kg/m      Reproductive/Obstetrics negative OB ROS                              Anesthesia Physical Anesthesia Plan  ASA: 3  Anesthesia Plan: General   Post-op Pain Management: Minimal or no pain anticipated, Gabapentin PO (pre-op)* and Ofirmev IV (intra-op)*   Induction: Intravenous  PONV Risk Score and Plan: 2 and Propofol infusion, TIVA, Ondansetron, Midazolam and Dexamethasone  Airway Management Planned: Nasal Cannula and Natural Airway  Additional Equipment: None  Intra-op Plan:   Post-operative Plan:   Informed Consent: I have reviewed the patients History and Physical,  chart, labs and discussed the procedure including the risks, benefits and alternatives for the proposed anesthesia with the patient or authorized representative who has indicated his/her understanding and acceptance.     Dental advisory given  Plan Discussed with: CRNA and Surgeon  Anesthesia Plan Comments: (Discussed risks of anesthesia with patient, including possibility of difficulty with spontaneous ventilation under anesthesia necessitating airway intervention, PONV, and rare  risks such as cardiac or respiratory or neurological events, and allergic reactions. Discussed the role of CRNA in patient's perioperative care. Patient understands.)         Anesthesia Quick Evaluation

## 2023-06-26 NOTE — Op Note (Signed)
    OPERATIVE NOTE   PROCEDURE: Excisional debridement left groin wound. Placement of a nondisposable VAC dressing  PRE-OPERATIVE DIAGNOSIS: Left groin abscess  POST-OPERATIVE DIAGNOSIS: Same  SURGEON: Earl Lites Jaymie Misch  ASSISTANT(S): Rolla Plate, NP  ANESTHESIA: MAC  ESTIMATED BLOOD LOSS: <5 cc  FINDING(S): Muscle flap appears healthy.  Small areas of devitalized tissue  SPECIMEN(S): Debrided tissue is not sent for specimen  INDICATIONS:   Laura Reed is a 62 y.o. female who presents with a left groin wound status post incision and drainage of a left groin abscess with sartorius muscle flap rotation for coverage.  Patient is returning to the operating room for dressing change and evaluation of the wound.  Risks and benefits have been reviewed all questions answered patient agrees to proceed.  DESCRIPTION: After full informed written consent was obtained from the patient, the patient was brought back to the operating room and placed supine upon the operating table.  Prior to induction, the patient received IV antibiotics.   After obtaining adequate anesthesia, the patient was then prepped and draped in the standard fashion for a VAC dressing change with debridement of the wound.  The existing VAC is removed and the wound is prepped with Betadine solution.  After allowing the Betadine to saturate for several minutes the wound was irrigated with a liter of saline.  The muscle flap appears to be healthy and completely viable.  There are patchy areas of devitalized tissue and these are excised with Metzenbaum scissors.  Once the wound has been completely cleaned it is measured.  Today the wound measures 17 cm in length by 4 cm in width and 3 cm in depth.  This is a total of 68 cm.  A medium black VAC sponge is opened onto the field trimmed to the appropriate size and a VAC dressing is applied over the groin.  Excellent seal is obtained in the operating room.   The patient tolerated  this procedure well.   COMPLICATIONS: None  CONDITION: [name]  Office: [phone]   06/26/2023, 1:17 PM

## 2023-06-26 NOTE — Anesthesia Postprocedure Evaluation (Signed)
Anesthesia Post Note  Patient: Laura Reed  Procedure(s) Performed: WOUND VAC EXCHANGE LEFT GROIN (Left)  Patient location during evaluation: PACU Anesthesia Type: General Level of consciousness: awake and alert Pain management: pain level controlled Vital Signs Assessment: post-procedure vital signs reviewed and stable Respiratory status: spontaneous breathing, nonlabored ventilation, respiratory function stable and patient connected to nasal cannula oxygen Cardiovascular status: blood pressure returned to baseline and stable Postop Assessment: no apparent nausea or vomiting Anesthetic complications: no   No notable events documented.   Last Vitals:  Vitals:   06/26/23 1410 06/26/23 1415  BP:    Pulse: 96 (!) 34  Resp:  13  Temp:    SpO2: 96% 94%    Last Pain:  Vitals:   06/26/23 1345  TempSrc:   PainSc: 0-No pain                 Corinda Gubler

## 2023-06-26 NOTE — Progress Notes (Signed)
Date of Admission:  06/21/2023      ID: Laura Reed is a 62 y.o. female with   Principal Problem:   Left leg pain Active Problems:   Hypertension   PVD (peripheral vascular disease) (HCC)   Hyperlipidemia associated with type 2 diabetes mellitus (HCC)   CAD (coronary artery disease)   Type II diabetes mellitus with peripheral circulatory disorder (HCC)   AKI (acute kidney injury) (HCC)   Chronic diastolic CHF (congestive heart failure) (HCC)   Hyponatremia   Diarrhea   Leukocytosis   Hypophosphatemia   Normocytic anemia   Abscess of groin, left    Subjective: Pt says she is doing fine Had wound assessment and vac change today  Medications:   aspirin  81 mg Oral QHS   atorvastatin  20 mg Oral Daily   Chlorhexidine Gluconate Cloth  6 each Topical Daily   cholecalciferol  2,000 Units Oral Daily   gabapentin  300 mg Oral BID   insulin aspart  0-15 Units Subcutaneous TID AC & HS   insulin aspart  3 Units Subcutaneous TID WC   insulin glargine-yfgn  19 Units Subcutaneous QHS   midodrine  10 mg Oral TID WC   multivitamin with minerals  1 tablet Oral Daily   senna  1 tablet Oral Daily   sodium chloride  1 g Oral BID WC    Objective: Vital signs in last 24 hours: Patient Vitals for the past 24 hrs:  BP Temp Temp src Pulse Resp SpO2 Height Weight  06/26/23 1800 (!) 141/63 -- Oral (!) 37 13 100 % -- --  06/26/23 1700 (!) 124/92 98.3 F (36.8 C) Oral (!) 38 17 (!) 89 % -- --  06/26/23 1600 (!) 65/47 -- -- (!) 136 16 (!) 84 % -- --  06/26/23 1500 (!) 113/96 98.3 F (36.8 C) Oral 94 12 99 % -- --  06/26/23 1415 -- -- -- (!) 34 13 94 % -- --  06/26/23 1410 -- -- -- 96 -- 96 % -- --  06/26/23 1405 -- -- -- (!) 29 -- 93 % -- --  06/26/23 1400 (!) 89/58 -- -- (!) 28 -- 91 % -- --  06/26/23 1355 -- 98.4 F (36.9 C) -- (!) 31 -- 92 % -- --  06/26/23 1350 -- -- -- (!) 32 -- 92 % -- --  06/26/23 1345 95/65 -- -- (!) 33 -- 90 % -- --  06/26/23 1340 -- -- -- (!) 25 -- 92 %  -- --  06/26/23 1339 97/65 -- -- (!) 30 -- 91 % -- --  06/26/23 1330 97/65 (!) 97 F (36.1 C) -- 67 20 92 % -- --  06/26/23 1036 121/70 -- -- 66 18 97 % 5\' 7"  (1.702 m) 74.5 kg  06/26/23 1000 121/70 -- -- (!) 119 12 (!) 89 % -- --  06/26/23 0900 (!) 112/44 -- -- (!) 33 10 96 % -- --  06/26/23 0802 (!) 120/44 -- -- (!) 32 17 100 % -- --  06/26/23 0800 (!) 58/48 97.8 F (36.6 C) Oral (!) 48 15 91 % -- --  06/26/23 0707 (!) 131/109 -- -- 91 16 99 % -- --  06/26/23 0700 -- -- -- 67 18 99 % -- --  06/26/23 0600 (!) 124/44 -- -- 85 11 100 % -- --  06/26/23 0545 107/73 -- -- 97 11 100 % -- --  06/26/23 0500 -- -- -- -- -- -- -- 74.5 kg  06/26/23 0400 (!) 119/43 97.9 F (36.6 C) Oral 73 15 99 % -- --  06/26/23 0320 (!) 102/44 -- -- (!) 31 10 99 % -- --  06/26/23 0202 (!) 94/48 -- -- 92 14 99 % -- --  06/26/23 0005 (!) 103/49 98.1 F (36.7 C) Oral 68 12 100 % -- --  06/25/23 2200 111/69 -- -- (!) 47 11 99 % -- --  06/25/23 2035 104/70 -- -- (!) 57 10 100 % -- --  06/25/23 1951 139/61 97.8 F (36.6 C) Oral 60 17 96 % -- --      PHYSICAL EXAM:  General: Alert, cooperative, no distress, appears stated age.  Lungs: Clear to auscultation bilaterally. No Wheezing or Rhonchi. No rales. Heart: Regular rate and rhythm, no murmur, rub or gallop. Abdomen: Soft, non-tender,not distended. Bowel sounds normal. No masses Extremities:left groin wound vac Skin: No rashes or lesions. Or bruising Lymph: Cervical, supraclavicular normal. Neurologic: Grossly non-focal  Lab Results    Latest Ref Rng & Units 06/26/2023    4:19 AM 06/25/2023    6:45 AM 06/24/2023    4:45 AM  CBC  WBC 4.0 - 10.5 K/uL 18.1  15.5  18.7   Hemoglobin 12.0 - 15.0 g/dL 9.3  9.5  78.2   Hematocrit 36.0 - 46.0 % 27.2  28.4  33.1   Platelets 150 - 400 K/uL 336  299  326        Latest Ref Rng & Units 06/26/2023    4:19 AM 06/25/2023    6:45 AM 06/24/2023    4:45 AM  CMP  Glucose 70 - 99 mg/dL 956  213  086   BUN 8 - 23  mg/dL 19  18  19    Creatinine 0.44 - 1.00 mg/dL 5.78  4.69  6.29   Sodium 135 - 145 mmol/L 137  133  132   Potassium 3.5 - 5.1 mmol/L 4.4  4.4  3.6   Chloride 98 - 111 mmol/L 111  109  104   CO2 22 - 32 mmol/L 21  18  21    Calcium 8.9 - 10.3 mg/dL 8.1  7.7  7.5   Total Protein 6.5 - 8.1 g/dL   5.5   Total Bilirubin <1.2 mg/dL   0.4   Alkaline Phos 38 - 126 U/L   113   AST 15 - 41 U/L   16   ALT 0 - 44 U/L   14       Microbiology: WC - ecoli-pansensitive Studies/Results: ECHOCARDIOGRAM COMPLETE Result Date: 06/25/2023    ECHOCARDIOGRAM REPORT   Patient Name:   Laura Reed Date of Exam: 06/24/2023 Medical Rec #:  [id number]       Height:       67.0 in Accession #:    [id number]      Weight:       141.4 lb Date of Birth:  July 12, 1960       BSA:          1.745 m Patient Age:    62 years        BP:           107/74 mmHg Patient Gender: F               HR:           65 bpm. Exam Location:  [facility] Procedure: 2D Echo, Cardiac Doppler and Color Doppler Indications:     I48.91 Atrial Fibrillation.  History:  Patient has no prior history of Echocardiogram examinations.                  CHF, CAD, Signs/Symptoms:Murmur; Risk Factors:Hypertension and                  Diabetes.  Sonographer:     Daphine Deutscher RDCS Referring Phys:  [id number] Latina Craver [name] Diagnosing Phys: Julien Nordmann MD IMPRESSIONS  1. Left ventricular ejection fraction, by estimation, is 35 to 40%. The left ventricle has moderately decreased function. The left ventricle demonstrates global hypokinesis. The left ventricular internal cavity size was mildly dilated. Left ventricular diastolic parameters are indeterminate.  2. Right ventricular systolic function is normal. The right ventricular size is normal. There is mildly elevated pulmonary artery systolic pressure. The estimated right ventricular systolic pressure is 42.5 mmHg.  3. The mitral valve is normal in structure. Mild to moderate mitral valve regurgitation. No  evidence of mitral stenosis.  4. Tricuspid valve regurgitation is mild to moderate.  5. The aortic valve is calcified. There is moderate calcification of the aortic valve. Aortic valve regurgitation is not visualized. Aortic valve sclerosis/calcification is present, without any evidence of aortic stenosis.  6. The inferior vena cava is dilated in size with >50% respiratory variability, suggesting right atrial pressure of 8 mmHg. FINDINGS  Left Ventricle: Left ventricular ejection fraction, by estimation, is 35 to 40%. The left ventricle has moderately decreased function. The left ventricle demonstrates global hypokinesis. The left ventricular internal cavity size was mildly dilated. There is no left ventricular hypertrophy. Left ventricular diastolic parameters are indeterminate. Right Ventricle: The right ventricular size is normal. No increase in right ventricular wall thickness. Right ventricular systolic function is normal. There is mildly elevated pulmonary artery systolic pressure. The tricuspid regurgitant velocity is 2.85  m/s, and with an assumed right atrial pressure of 10 mmHg, the estimated right ventricular systolic pressure is 42.5 mmHg. Left Atrium: Left atrial size was normal in size. Right Atrium: Right atrial size was normal in size. Pericardium: There is no evidence of pericardial effusion. Mitral Valve: The mitral valve is normal in structure. Mild to moderate mitral valve regurgitation. No evidence of mitral valve stenosis. Tricuspid Valve: The tricuspid valve is normal in structure. Tricuspid valve regurgitation is mild to moderate. No evidence of tricuspid stenosis. Aortic Valve: The aortic valve is calcified. There is moderate calcification of the aortic valve. Aortic valve regurgitation is not visualized. Aortic valve sclerosis/calcification is present, without any evidence of aortic stenosis. Pulmonic Valve: The pulmonic valve was normal in structure. Pulmonic valve regurgitation is not  visualized. No evidence of pulmonic stenosis. Aorta: The aortic root is normal in size and structure. Venous: The inferior vena cava is dilated in size with greater than 50% respiratory variability, suggesting right atrial pressure of 8 mmHg. IAS/Shunts: No atrial level shunt detected by color flow Doppler.  LEFT VENTRICLE PLAX 2D LVIDd:         5.90 cm   Diastology LVIDs:         4.60 cm   LV e' medial:    5.82 cm/s LV PW:         0.80 cm   LV E/e' medial:  25.1 LV IVS:        0.50 cm   LV e' lateral:   7.51 cm/s LVOT diam:     1.80 cm   LV E/e' lateral: 19.4 LV SV:         50 LV SV Index:  29 LVOT Area:     2.54 cm  RIGHT VENTRICLE            IVC RV Basal diam:  4.20 cm    IVC diam: 2.20 cm RV S prime:     8.98 cm/s TAPSE (M-mode): 2.4 cm LEFT ATRIUM             Index        RIGHT ATRIUM           Index LA diam:        4.10 cm 2.35 cm/m   RA Area:     13.20 cm LA Vol (A2C):   68.0 ml 38.96 ml/m  RA Volume:   29.20 ml  16.73 ml/m LA Vol (A4C):   47.6 ml 27.28 ml/m LA Biplane Vol: 58.8 ml 33.69 ml/m  AORTIC VALVE LVOT Vmax:   83.15 cm/s LVOT Vmean:  55.750 cm/s LVOT VTI:    0.196 m  AORTA Ao Root diam: 2.50 cm MITRAL VALVE                TRICUSPID VALVE MV Area (PHT): 3.68 cm     TR Peak grad:   32.5 mmHg MV Decel Time: 206 msec     TR Vmax:        285.00 cm/s MV E velocity: 146.00 cm/s MV A velocity: 79.00 cm/s   SHUNTS MV E/A ratio:  1.85         Systemic VTI:  0.20 m                             Systemic Diam: 1.80 cm Julien Nordmann MD Electronically signed by Julien Nordmann MD Signature Date/Time: 06/25/2023/5:07:32 PM    Final      Assessment/Plan: PAD s/p left common femoral artery to tibioperoneal trunk artery bypass  with PTFE graft in OCT 2024  Abscess due to Ecoli  at the surgical site- CT showed  10 cm complex collection s/p excisional debridement of the abscess and rotation of a sarorius muscle flap for coverage of existing bypass grafts  Ecoli in culture so far Pan sensitive- change  dapto /cefpeime to unasyn   Left TMA- healed well  ecoli and enterococcus in  culture in sept when she had TMA  CAD s/p CABG   H/o bladder ca s/p left nephrectomy Solitary kidney- avoid nephrotoxic drugs   AKI-  resolved  Discussed the management with the patient  ID will follow her peripherally this weekend- call if needed

## 2023-06-26 NOTE — Interval H&P Note (Signed)
History and Physical Interval Note:  06/26/2023 12:37 PM  Laura Reed  has presented today for surgery, with the diagnosis of left groin infection.  The various methods of treatment have been discussed with the patient and family. After consideration of risks, benefits and other options for treatment, the patient has consented to  Procedure(s): WOUND VAC EXCHANGE LEFT GROIN (Left) as a surgical intervention.  The patient's history has been reviewed, patient examined, no change in status, stable for surgery.  I have reviewed the patient's chart and labs.  Questions were answered to the patient's satisfaction.     [name]

## 2023-06-27 ENCOUNTER — Encounter: Payer: Self-pay | Admitting: Vascular Surgery

## 2023-06-27 LAB — BASIC METABOLIC PANEL
Anion gap: 4 — ABNORMAL LOW (ref 5–15)
Anion gap: 7 (ref 5–15)
BUN: 21 mg/dL (ref 8–23)
BUN: 24 mg/dL — ABNORMAL HIGH (ref 8–23)
CO2: 22 mmol/L (ref 22–32)
CO2: 19 mmol/L — ABNORMAL LOW (ref 22–32)
Calcium: 8.6 mg/dL — ABNORMAL LOW (ref 8.9–10.3)
Calcium: 8.6 mg/dL — ABNORMAL LOW (ref 8.9–10.3)
Chloride: 109 mmol/L (ref 98–111)
Chloride: 108 mmol/L (ref 98–111)
Creatinine, Ser: 0.76 mg/dL (ref 0.44–1.00)
Creatinine, Ser: 0.79 mg/dL (ref 0.44–1.00)
GFR, Estimated: >60 mL/min (ref >60)
GFR, Estimated: >60 mL/min (ref >60)
Glucose, Bld: 162 mg/dL — ABNORMAL HIGH (ref 70–99)
Glucose, Bld: 163 mg/dL — ABNORMAL HIGH (ref 70–99)
Potassium: 5 mmol/L (ref 3.5–5.1)
Potassium: 4.7 mmol/L (ref 3.5–5.1)
Sodium: 135 mmol/L (ref 135–145)
Sodium: 134 mmol/L — ABNORMAL LOW (ref 135–145)

## 2023-06-27 LAB — TYPE AND SCREEN
ABO/RH(D): A POS
Antibody Screen: NEGATIVE
Unit division: 0

## 2023-06-27 LAB — CBC
HCT: 21.5 % — ABNORMAL LOW (ref 36.0–46.0)
HCT: 30.1 % — ABNORMAL LOW (ref 36.0–46.0)
HCT: 29.9 % — ABNORMAL LOW (ref 36.0–46.0)
Hemoglobin: 7.3 g/dL — ABNORMAL LOW (ref 12.0–15.0)
Hemoglobin: 10.2 g/dL — ABNORMAL LOW (ref 12.0–15.0)
Hemoglobin: 10.2 g/dL — ABNORMAL LOW (ref 12.0–15.0)
MCH: 29.6 pg (ref 26.0–34.0)
MCH: 29.8 pg (ref 26.0–34.0)
MCH: 29.6 pg (ref 26.0–34.0)
MCHC: 34 g/dL (ref 30.0–36.0)
MCHC: 33.9 g/dL (ref 30.0–36.0)
MCHC: 34.1 g/dL (ref 30.0–36.0)
MCV: 87 fL (ref 80.0–100.0)
MCV: 88 fL (ref 80.0–100.0)
MCV: 86.7 fL (ref 80.0–100.0)
Platelets: 272 10*3/uL (ref 150–400)
Platelets: 368 10*3/uL (ref 150–400)
Platelets: 420 10*3/uL — ABNORMAL HIGH (ref 150–400)
RBC: 2.47 MIL/uL — ABNORMAL LOW (ref 3.87–5.11)
RBC: 3.42 MIL/uL — ABNORMAL LOW (ref 3.87–5.11)
RBC: 3.45 MIL/uL — ABNORMAL LOW (ref 3.87–5.11)
RDW: 14.1 % (ref 11.5–15.5)
RDW: 14.1 % (ref 11.5–15.5)
RDW: 14 % (ref 11.5–15.5)
WBC: 13.9 10*3/uL — ABNORMAL HIGH (ref 4.0–10.5)
WBC: 21.9 10*3/uL — ABNORMAL HIGH (ref 4.0–10.5)
WBC: 30.2 10*3/uL — ABNORMAL HIGH (ref 4.0–10.5)
nRBC: 0 % (ref 0.0–0.2)
nRBC: 0 % (ref 0.0–0.2)
nRBC: 0 % (ref 0.0–0.2)

## 2023-06-27 LAB — BPAM RBC
Blood Product Expiration Date: 202501152359
Unit Type and Rh: 6200

## 2023-06-27 LAB — GLUCOSE, CAPILLARY
Glucose-Capillary: 159 mg/dL — ABNORMAL HIGH (ref 70–99)
Glucose-Capillary: 167 mg/dL — ABNORMAL HIGH (ref 70–99)
Glucose-Capillary: 129 mg/dL — ABNORMAL HIGH (ref 70–99)
Glucose-Capillary: 106 mg/dL — ABNORMAL HIGH (ref 70–99)

## 2023-06-27 LAB — PREPARE RBC (CROSSMATCH)

## 2023-06-27 LAB — HEPARIN LEVEL (UNFRACTIONATED)
Heparin Unfractionated: 0.22 [IU]/mL — ABNORMAL LOW (ref 0.30–0.70)
Heparin Unfractionated: 0.37 [IU]/mL (ref 0.30–0.70)
Heparin Unfractionated: 0.44 [IU]/mL (ref 0.30–0.70)
Heparin Unfractionated: 0.5 [IU]/mL (ref 0.30–0.70)

## 2023-06-27 MED ORDER — ALUM & MAG HYDROXIDE-SIMETH 200-200-20 MG/5ML PO SUSP
30.0000 mL | ORAL | Status: DC | PRN
  Administered 2023-06-27: 30 mL via ORAL
  Filled 2023-06-27: qty 30

## 2023-06-27 MED ORDER — CALCIUM GLUCONATE-NACL 1-0.675 GM/50ML-% IV SOLN
1.0000 g | Freq: Once | INTRAVENOUS | Status: AC
  Administered 2023-06-27: 1000 mg via INTRAVENOUS
  Filled 2023-06-27: qty 50

## 2023-06-27 MED ORDER — MIDODRINE HCL 5 MG PO TABS
10.0000 mg | ORAL_TABLET | Freq: Two times a day (BID) | ORAL | Status: DC
  Administered 2023-06-27 – 2023-06-28 (×2): 10 mg via ORAL
  Filled 2023-06-27 (×3): qty 2

## 2023-06-27 MED ORDER — SODIUM CHLORIDE 0.9% IV SOLUTION: Freq: Once | INTRAVENOUS | Status: AC

## 2023-06-27 MED ORDER — POLYETHYLENE GLYCOL 3350 17 G PO PACK
17.0000 g | PACK | Freq: Every day | ORAL | Status: DC
  Administered 2023-06-27 – 2023-07-17 (×12): 17 g via ORAL
  Filled 2023-06-27 (×23): qty 1

## 2023-06-27 MED ORDER — INSULIN ASPART 100 UNIT/ML IJ SOLN
5.0000 [IU] | Freq: Three times a day (TID) | INTRAMUSCULAR | Status: DC
  Administered 2023-06-27: 5 [IU] via SUBCUTANEOUS
  Filled 2023-06-27: qty 1

## 2023-06-27 MED ORDER — POTASSIUM CHLORIDE 20 MEQ PO PACK
40.0000 meq | PACK | Freq: Once | ORAL | Status: AC
  Administered 2023-06-27: 40 meq via ORAL
  Filled 2023-06-27: qty 2

## 2023-06-27 NOTE — Progress Notes (Signed)
PHARMACY - ANTICOAGULATION CONSULT NOTE  Pharmacy Consult for heparin infusion Indication: atrial fibrillation  No Known Allergies  Patient Measurements: Height: 5\' 7"  (170.2 cm) Weight: 74.5 kg (164 lb 3.9 oz) IBW/kg (Calculated) : 61.6 Heparin Dosing Weight: 64.1 kg  Vital Signs: Temp: 98.5 F (36.9 C) (12/21 1224) Temp Source: Oral (12/21 1224) BP: 138/61 (12/21 1400) Pulse Rate: 37 (12/21 1400)  Labs: Recent Labs    06/25/23 0645 06/25/23 1954 06/26/23 0419 06/26/23 2356 06/27/23 0621 06/27/23 0802 06/27/23 1349  HGB  --   --  9.3*  --  7.3* 10.2*  --   HCT  --   --  27.2*  --  21.5* 30.1*  --   PLT  --   --  336  --  272 368  --   APTT  --  46*  --   --   --   --   --   HEPARINUNFRC   < >  --  0.23* 0.37 0.22*  --  0.44  CREATININE  --   --  0.74  --  0.75 0.76 0.79   < > = values in this interval not displayed.    Estimated Creatinine Clearance: 74.9 mL/min (by C-G formula based on SCr of 0.79 mg/dL).   Medical History: Past Medical History:  Diagnosis Date   Absence of kidney    left   Anxiety    Arthritis    Atherosclerosis of native arteries of extremities with intermittent claudication, bilateral legs (HCC)    Bladder cancer (HCC)    CHF (congestive heart failure) (HCC)    Complication of anesthesia    BP HAS  RUN LOW AFTER SURGERY-LUNGS FILLED UP WITH FLUID AFTER  LEG STENT SURGERY    Coronary artery disease    Diabetes mellitus    Family history of adverse reaction to anesthesia    Sister - PONV   GERD (gastroesophageal reflux disease)    OCC TUMS   Heart murmur    Hemorrhoid    History of methicillin resistant staphylococcus aureus (MRSA) 2007   Hypertension    Neuropathy    PVD (peripheral vascular disease) (HCC)    Thyroid nodule    right   Unspecified osteoarthritis, unspecified site    Urothelial carcinoma of kidney (HCC) 10/31/2014   INVASIVE UROTHELIAL CARCINOMA, LOW GRADE. T1, Nx.   Vitamin D deficiency, unspecified    Wears  dentures    full upper and lower    Medications:  Scheduled:   aspirin  81 mg Oral QHS   atorvastatin  20 mg Oral Daily   Chlorhexidine Gluconate Cloth  6 each Topical Daily   cholecalciferol  2,000 Units Oral Daily   gabapentin  300 mg Oral BID   insulin aspart  0-15 Units Subcutaneous TID AC & HS   insulin aspart  3 Units Subcutaneous TID WC   insulin glargine-yfgn  19 Units Subcutaneous QHS   midodrine  10 mg Oral TID WC   multivitamin with minerals  1 tablet Oral Daily   senna  1 tablet Oral Daily    Assessment: 62 y.o female with significant PMH of HTN, HLD, DM, sCHF, CAD, PAD (s/p of left common femoral-distal bypass, thrombectomy, angioplasty and stent placement 04/2023 on aspirin, Plavix and Eliquis), anxiety, bladder cancer (s/p of nephrectomy) who presented to the ED with chief complaints of left groin mass. Noted apixaban prior to arrival with last dose documented 06/20/23 pm  Baseline Labs: INR 1.1, aPTT 35s, H&H/PLT stable  Goal of Therapy:  Heparin level 0.3-0.7 units/ml Monitor platelets by anticoagulation protocol: Yes  Heparin Level 12/20@2356  HL = 0.37 Therapeutic x1  12/21@0621  HL = 0.22 SUBtherapeutic   12/21@1349  HL = 0.44 Therapeutic x1                 Plan:   Continue heparin infusion at 1500 units/hour No bolus needed. Reassessment of HGB was 10.2 >> 7.3 Recheck levels in 6 hours CBC at least once daily while on IV heparin  Effie Shy, PharmD Pharmacy Resident  06/27/2023 2:24 PM

## 2023-06-27 NOTE — Progress Notes (Signed)
Progress Note   Patient: Laura Reed UUV:[id number] DOB: Nov 23, 1960 DOA: 06/21/2023     6 DOS: the patient was seen and examined on 06/27/2023   Brief hospital course: 62 y.o female with significant PMH of HTN, HLD, DM, sCHF, CAD, PAD (s/p of left common femoral-distal bypass, thrombectomy, angioplasty and stent placement 04/2023 on aspirin, Plavix and Eliquis), anxiety, bladder cancer (s/p of nephrectomy) who presented to the ED with chief complaints of    Patient presented to [facility] urgent care on 06/21/23 with complaints of right groin pain, redness and oozing post op. Patient has hx of PAD and recently underwent left common femoral-distal bypass, thrombectomy, angioplasty and stent placement 04/2023  and right transmetatarsal amputation. States that she has been having left leg swelling. No weeping. Some pitting. She thinks she has been having fever. She was noted to be hypotensive with SBP in the 50s and was sent to the ED for further evaluation.  Patient required vasopressors 12/16.  She went to the OR 12/18 and 200 cc of pus were drained from her left groin.  Her blood pressure stabilized off of pressors and she was transferred to stepdown unit.  Infectious disease was consulted and patient was continued on antibiotics.  Assessment and Plan:  Left groin abscess Sepsis with septic shock resolved Status post I&D with vascular surgery 12/18 with takeback to the OR for left groin debridement, muscle flap, wound VAC exchange 12/20  -continue antimicrobials per ID, currently on Unasyn -Pain control  PAD s/p of left common femoral-tibioperoneal trunk bypass, thrombectomy, angioplasty and stent placement 04/2023  on aspirin, Plavix and Eliquis  -Currently on heparin infusion   Leukocytosis  In the setting of her infection and multiple trips to the OR.  She remains afebrile    Latest Ref Rng & Units 06/27/2023    8:02 AM 06/27/2023    6:21 AM 06/26/2023    4:19 AM  CBC  WBC 4.0 - 10.5  K/uL 21.9  13.9  18.1   Hemoglobin 12.0 - 15.0 g/dL 47.4  7.3  9.3   Hematocrit 36.0 - 46.0 % 30.1  21.5  27.2   Platelets 150 - 400 K/uL 368  272  336    -Continue to monitor     Normocytic anemia    Latest Ref Rng & Units 06/27/2023    8:02 AM 06/27/2023    6:21 AM 06/26/2023    4:19 AM  CBC  WBC 4.0 - 10.5 K/uL 21.9  13.9  18.1   Hemoglobin 12.0 - 15.0 g/dL 25.9  7.3  9.3   Hematocrit 36.0 - 46.0 % 30.1  21.5  27.2   Platelets 150 - 400 K/uL 368  272  336   Unclear exact etiology previously had normal hemoglobin prior to vascular procedures.  This a.m. patient's hemoglobin was noted to be 7.3 from previously stable hemoglobin in the 9.0s.  Repeat hemoglobin 10.2.  Suspect 7.3 was erroneous.  Patient does have some blood tinge output in her wound VAC but does not appear large enough to account for 2 point blood loss.  Patient was given 1 unit of packed red blood cells by vascular team. -Continue to monitor hemoglobin -Check iron panel and vitamin B12 and folate  AKI resolved    Latest Ref Rng & Units 06/27/2023    1:49 PM 06/27/2023    8:02 AM 06/27/2023    6:21 AM  BMP  Glucose 70 - 99 mg/dL 563  875  QUESTIONABLE RESULTS, RECOMMEND RECOLLECT  TO VERIFY  C  BUN 8 - 23 mg/dL 24  21  QUESTIONABLE RESULTS, RECOMMEND RECOLLECT TO VERIFY  C  Creatinine 0.44 - 1.00 mg/dL 1.61  0.96  0.45   Sodium 135 - 145 mmol/L 134  135  QUESTIONABLE RESULTS, RECOMMEND RECOLLECT TO VERIFY  C  Potassium 3.5 - 5.1 mmol/L 4.7  5.0  QUESTIONABLE RESULTS, RECOMMEND RECOLLECT TO VERIFY  C  Chloride 98 - 111 mmol/L 108  109  QUESTIONABLE RESULTS, RECOMMEND RECOLLECT TO VERIFY  C  CO2 22 - 32 mmol/L 19  22  QUESTIONABLE RESULTS, RECOMMEND RECOLLECT TO VERIFY  C  Calcium 8.9 - 10.3 mg/dL 8.6  8.6  QUESTIONABLE RESULTS, RECOMMEND RECOLLECT TO VERIFY  C    C Corrected result    PVC Patient noted to have frequent PVCs on the monitor in room. She is asymptomatic from these. This is likely causing her  heart rate to appear to be lower than normal.  -Will ensure potassium >4, Magnesium>2 -Continue to monitor  Hyponatremia  Mild on AM labs.  Continue to monitor with improving PO intake.   Chronic diastolic HF No evidence of volume overload   HTN  HLD Continue statin Hold home antihypertensives. Decrease midodrine to BID dosing. Mostly normotensive ON.  T2DM  Poorly controlled  A1c 8 -Continue long-acting at 19 units -Increase mealtime to 5 units 3 times daily      Subjective:  Patient denies any chest pain or shortness of breath.  She continues to have some discomfort in her left lower extremity but notes this is improving  Physical Exam: Vitals:   06/27/23 1200 06/27/23 1224 06/27/23 1300 06/27/23 1400  BP: (!) 97/37 (!) 137/98 122/72 138/61  Pulse: (!) 135 65 77 (!) 37  Resp: (!) 21 14 14  (!) 9  Temp:  98.5 F (36.9 C)    TempSrc:  Oral    SpO2: (!) 68% 100% 100% 98%  Weight:      Height:       Constitutional: Well-developed, well-nourished, and in no distress.  Cardiovascular: Normal rate, regular rhythm, 2+ edema of LLE to hips, trace RLE edema   Pulmonary: Non labored breathing on room air, no wheezing or rales  Abdominal: Soft. Normal bowel sounds. Non distended and non tender Musculoskeletal: Minimal TTP of L thigh, wound in place in L groin holding suction, canister w/ blood tinged output. S/p L TMA,  Neurological: Alert and oriented to person, place, and time. Non focal  Skin: Skin is warm and dry.   Data Reviewed:  I have reviewed all labs and images.     Latest Ref Rng & Units 06/27/2023    8:02 AM 06/27/2023    6:21 AM 06/26/2023    4:19 AM  CBC  WBC 4.0 - 10.5 K/uL 21.9  13.9  18.1   Hemoglobin 12.0 - 15.0 g/dL 40.9  7.3  9.3   Hematocrit 36.0 - 46.0 % 30.1  21.5  27.2   Platelets 150 - 400 K/uL 368  272  336       Latest Ref Rng & Units 06/27/2023    1:49 PM 06/27/2023    8:02 AM 06/27/2023    6:21 AM  BMP  Glucose 70 - 99 mg/dL 811   914  QUESTIONABLE RESULTS, RECOMMEND RECOLLECT TO VERIFY  C  BUN 8 - 23 mg/dL 24  21  QUESTIONABLE RESULTS, RECOMMEND RECOLLECT TO VERIFY  C  Creatinine 0.44 - 1.00 mg/dL 7.82  9.56  2.13  Sodium 135 - 145 mmol/L 134  135  QUESTIONABLE RESULTS, RECOMMEND RECOLLECT TO VERIFY  C  Potassium 3.5 - 5.1 mmol/L 4.7  5.0  QUESTIONABLE RESULTS, RECOMMEND RECOLLECT TO VERIFY  C  Chloride 98 - 111 mmol/L 108  109  QUESTIONABLE RESULTS, RECOMMEND RECOLLECT TO VERIFY  C  CO2 22 - 32 mmol/L 19  22  QUESTIONABLE RESULTS, RECOMMEND RECOLLECT TO VERIFY  C  Calcium 8.9 - 10.3 mg/dL 8.6  8.6  QUESTIONABLE RESULTS, RECOMMEND RECOLLECT TO VERIFY  C    C Corrected result    Disposition: Status is: Inpatient Remains inpatient appropriate because: LLE abscess requiring OR washout and frequent trips back to the OR, IV antibiotics   Planned Discharge Destination:  Pending    Heparin infusion  Time spent: 35 minutes  Author: Marolyn Haller, MD 06/27/2023 3:00 PM  For on call review [email or web address].

## 2023-06-27 NOTE — Progress Notes (Signed)
PHARMACY - ANTICOAGULATION CONSULT NOTE  Pharmacy Consult for heparin infusion Indication: atrial fibrillation  No Known Allergies  Patient Measurements: Height: 5\' 7"  (170.2 cm) Weight: 74.5 kg (164 lb 3.9 oz) IBW/kg (Calculated) : 61.6 Heparin Dosing Weight: 64.1 kg  Vital Signs: Temp: 98.4 F (36.9 C) (12/21 0400) Temp Source: Oral (12/21 0400) BP: 112/68 (12/21 0625) Pulse Rate: 33 (12/21 0625)  Labs: Recent Labs    06/25/23 0645 06/25/23 1954 06/26/23 0419 06/26/23 2356 06/27/23 0621  HGB 9.5*  --  9.3*  --  7.3*  HCT 28.4*  --  27.2*  --  21.5*  PLT 299  --  336  --  272  APTT  --  46*  --   --   --   HEPARINUNFRC  --   --  0.23* 0.37 0.22*  CREATININE 0.79  --  0.74  --   --     Estimated Creatinine Clearance: 74.9 mL/min (by C-G formula based on SCr of 0.74 mg/dL).   Medical History: Past Medical History:  Diagnosis Date   Absence of kidney    left   Anxiety    Arthritis    Atherosclerosis of native arteries of extremities with intermittent claudication, bilateral legs (HCC)    Bladder cancer (HCC)    CHF (congestive heart failure) (HCC)    Complication of anesthesia    BP HAS  RUN LOW AFTER SURGERY-LUNGS FILLED UP WITH FLUID AFTER  LEG STENT SURGERY    Coronary artery disease    Diabetes mellitus    Family history of adverse reaction to anesthesia    Sister - PONV   GERD (gastroesophageal reflux disease)    OCC TUMS   Heart murmur    Hemorrhoid    History of methicillin resistant staphylococcus aureus (MRSA) 2007   Hypertension    Neuropathy    PVD (peripheral vascular disease) (HCC)    Thyroid nodule    right   Unspecified osteoarthritis, unspecified site    Urothelial carcinoma of kidney (HCC) 10/31/2014   INVASIVE UROTHELIAL CARCINOMA, LOW GRADE. T1, Nx.   Vitamin D deficiency, unspecified    Wears dentures    full upper and lower    Medications:  Scheduled:   aspirin  81 mg Oral QHS   atorvastatin  20 mg Oral Daily    Chlorhexidine Gluconate Cloth  6 each Topical Daily   cholecalciferol  2,000 Units Oral Daily   gabapentin  300 mg Oral BID   insulin aspart  0-15 Units Subcutaneous TID AC & HS   insulin aspart  3 Units Subcutaneous TID WC   insulin glargine-yfgn  19 Units Subcutaneous QHS   midodrine  10 mg Oral TID WC   multivitamin with minerals  1 tablet Oral Daily   senna  1 tablet Oral Daily   sodium chloride  1 g Oral BID WC    Assessment: 62 y.o female with significant PMH of HTN, HLD, DM, sCHF, CAD, PAD (s/p of left common femoral-distal bypass, thrombectomy, angioplasty and stent placement 04/2023 on aspirin, Plavix and Eliquis), anxiety, bladder cancer (s/p of nephrectomy) who presented to the ED with chief complaints of left groin mass. Noted apixaban prior to arrival with last dose documented 06/20/23 pm. Hgb is trending down (11.2 > 9.3 > 7.3)  1221 0621 HL 0.22.    Goal of Therapy:  Heparin level 0.3-0.7 units/ml Monitor platelets by anticoagulation protocol: Yes   Plan:   Heparin level is subtherapeutic. Will hold bolus due to  down trend of hgb and increase heparin infusion to 1500 units/hr. Messaged MD about the down trending hgb for assessment. Recheck heparin level in 6 hours. CBC daily while on heparin.   Ronnald Ramp, PharmD Clinical Pharmacist 06/27/2023,7:42 AM

## 2023-06-27 NOTE — Plan of Care (Signed)
Continuing with plan of care. 

## 2023-06-27 NOTE — Progress Notes (Signed)
OT Cancellation Note  Patient Details Name: Laura Reed MRN: [id number] DOB: 01-12-1961   Cancelled Treatment:    Reason Eval/Treat Not Completed: Patient not medically ready. Chart reviewed. HR labile and BP soft. Hgb low pending blood transfusion. Will hold OT evaluation and re-attempt once more medically appropriate for exertional activity.   Arman Filter., MPH, MS, OTR/L ascom [phone] 06/27/23, 9:52 AM

## 2023-06-27 NOTE — Progress Notes (Signed)
PHARMACY - ANTICOAGULATION CONSULT NOTE  Pharmacy Consult for heparin infusion Indication: atrial fibrillation  No Known Allergies  Patient Measurements: Height: 5\' 7"  (170.2 cm) Weight: 74.5 kg (164 lb 3.9 oz) IBW/kg (Calculated) : 61.6 Heparin Dosing Weight: 64.1 kg  Vital Signs: Temp: 98.4 F (36.9 C) (12/20 2110) Temp Source: Oral (12/20 2110) BP: 111/63 (12/20 2200) Pulse Rate: 30 (12/20 2200)  Labs: Recent Labs    06/24/23 0445 06/25/23 0645 06/25/23 1954 06/26/23 0419 06/26/23 2356  HGB 11.2* 9.5*  --  9.3*  --   HCT 33.1* 28.4*  --  27.2*  --   PLT 326 299  --  336  --   APTT  --   --  46*  --   --   HEPARINUNFRC  --   --   --  0.23* 0.37  CREATININE 0.72 0.79  --  0.74  --   CKTOTAL 34*  --   --   --   --     Estimated Creatinine Clearance: 74.9 mL/min (by C-G formula based on SCr of 0.74 mg/dL).   Medical History: Past Medical History:  Diagnosis Date   Absence of kidney    left   Anxiety    Arthritis    Atherosclerosis of native arteries of extremities with intermittent claudication, bilateral legs (HCC)    Bladder cancer (HCC)    CHF (congestive heart failure) (HCC)    Complication of anesthesia    BP HAS  RUN LOW AFTER SURGERY-LUNGS FILLED UP WITH FLUID AFTER  LEG STENT SURGERY    Coronary artery disease    Diabetes mellitus    Family history of adverse reaction to anesthesia    Sister - PONV   GERD (gastroesophageal reflux disease)    OCC TUMS   Heart murmur    Hemorrhoid    History of methicillin resistant staphylococcus aureus (MRSA) 2007   Hypertension    Neuropathy    PVD (peripheral vascular disease) (HCC)    Thyroid nodule    right   Unspecified osteoarthritis, unspecified site    Urothelial carcinoma of kidney (HCC) 10/31/2014   INVASIVE UROTHELIAL CARCINOMA, LOW GRADE. T1, Nx.   Vitamin D deficiency, unspecified    Wears dentures    full upper and lower    Medications:  Scheduled:   aspirin  81 mg Oral QHS    atorvastatin  20 mg Oral Daily   Chlorhexidine Gluconate Cloth  6 each Topical Daily   cholecalciferol  2,000 Units Oral Daily   gabapentin  300 mg Oral BID   insulin aspart  0-15 Units Subcutaneous TID AC & HS   insulin aspart  3 Units Subcutaneous TID WC   insulin glargine-yfgn  19 Units Subcutaneous QHS   midodrine  10 mg Oral TID WC   multivitamin with minerals  1 tablet Oral Daily   senna  1 tablet Oral Daily   sodium chloride  1 g Oral BID WC    Assessment: 62 y.o female with significant PMH of HTN, HLD, DM, sCHF, CAD, PAD (s/p of left common femoral-distal bypass, thrombectomy, angioplasty and stent placement 04/2023 on aspirin, Plavix and Eliquis), anxiety, bladder cancer (s/p of nephrectomy) who presented to the ED with chief complaints of left groin mass. Noted apixaban prior to arrival with last dose documented 06/20/23 pm  Baseline Labs: INR 1.1, aPTT 35s, H&H/PLT stable  Goal of Therapy:  Heparin level 0.3-0.7 units/ml Monitor platelets by anticoagulation protocol: Yes   Plan:   12/20:  HL @ 2356 = 0.37, therapeutic X 1 - Will continue pt on current rate and recheck HL in 6 hrs. ---CBC at least once daily while on IV heparin  Keian Odriscoll D, PharmD Clinical Pharmacist 06/27/2023,12:49 AM

## 2023-06-27 NOTE — Progress Notes (Addendum)
PHARMACY CONSULT NOTE - FOLLOW UP  Pharmacy Consult for Electrolyte Monitoring and Replacement   Recent Labs: Potassium (mmol/L)  Date Value  06/27/2023 3.3 (L)  11/02/2014 4.2   Magnesium (mg/dL)  Date Value  86/57/8469 1.9  06/03/2013 2.0   Calcium (mg/dL)  Date Value  62/95/2841 6.2 (LL)   Calcium, Total (mg/dL)  Date Value  32/44/0102 7.3 (L)   Albumin (g/dL)  Date Value  72/53/6644 1.6 (L)  08/02/2021 4.3  06/03/2013 2.2 (L)   Phosphorus (mg/dL)  Date Value  03/47/4259 2.7   Sodium (mmol/L)  Date Value  06/27/2023 141  08/02/2021 136  11/02/2014 135    Assessment: 62 y.o female with significant PMH of HTN, HLD, DM, sCHF, CAD, PAD (s/p of left common femoral-distal bypass, thrombectomy, angioplasty and stent placement 04/2023 on aspirin, Plavix and Eliquis), anxiety, bladder cancer (s/p of nephrectomy) who presented to the ED with chief complaints of left groin mass.   Goal of Therapy:  WNL  Plan:  MD ordered Kcl 40 mEq x 1. Na trending up. Recommended to stop/hold NaCL tabs.  F/u with AM labs.   Ronnald Ramp ,PharmD Clinical Pharmacist 06/27/2023 7:53 AM

## 2023-06-27 NOTE — Progress Notes (Signed)
PHARMACY - ANTICOAGULATION CONSULT NOTE  Pharmacy Consult for heparin infusion Indication: atrial fibrillation  No Known Allergies  Patient Measurements: Height: 5\' 7"  (170.2 cm) Weight: 74.5 kg (164 lb 3.9 oz) IBW/kg (Calculated) : 61.6 Heparin Dosing Weight: 64.1 kg  Vital Signs: Temp: 97.6 F (36.4 C) (12/21 2100) Temp Source: Oral (12/21 2100) BP: 155/141 (12/21 2000) Pulse Rate: 59 (12/21 2000)  Labs: Recent Labs    06/25/23 1954 06/26/23 0419 06/27/23 0621 06/27/23 0802 06/27/23 1349 06/27/23 1608 06/27/23 1942  HGB  --    < > 7.3* 10.2*  --  10.2*  --   HCT  --    < > 21.5* 30.1*  --  29.9*  --   PLT  --    < > 272 368  --  420*  --   APTT 46*  --   --   --   --   --   --   HEPARINUNFRC  --    < > 0.22*  --  0.44  --  0.50  CREATININE  --    < > 0.75 0.76 0.79  --   --    < > = values in this interval not displayed.    Estimated Creatinine Clearance: 74.9 mL/min (by C-G formula based on SCr of 0.79 mg/dL).   Medical History: Past Medical History:  Diagnosis Date   Absence of kidney    left   Anxiety    Arthritis    Atherosclerosis of native arteries of extremities with intermittent claudication, bilateral legs (HCC)    Bladder cancer (HCC)    CHF (congestive heart failure) (HCC)    Complication of anesthesia    BP HAS  RUN LOW AFTER SURGERY-LUNGS FILLED UP WITH FLUID AFTER  LEG STENT SURGERY    Coronary artery disease    Diabetes mellitus    Family history of adverse reaction to anesthesia    Sister - PONV   GERD (gastroesophageal reflux disease)    OCC TUMS   Heart murmur    Hemorrhoid    History of methicillin resistant staphylococcus aureus (MRSA) 2007   Hypertension    Neuropathy    PVD (peripheral vascular disease) (HCC)    Thyroid nodule    right   Unspecified osteoarthritis, unspecified site    Urothelial carcinoma of kidney (HCC) 10/31/2014   INVASIVE UROTHELIAL CARCINOMA, LOW GRADE. T1, Nx.   Vitamin D deficiency, unspecified     Wears dentures    full upper and lower    Medications:  Scheduled:   aspirin  81 mg Oral QHS   atorvastatin  20 mg Oral Daily   Chlorhexidine Gluconate Cloth  6 each Topical Daily   cholecalciferol  2,000 Units Oral Daily   gabapentin  300 mg Oral BID   insulin aspart  0-15 Units Subcutaneous TID AC & HS   insulin aspart  5 Units Subcutaneous TID WC   insulin glargine-yfgn  19 Units Subcutaneous QHS   midodrine  10 mg Oral BID WC   multivitamin with minerals  1 tablet Oral Daily   polyethylene glycol  17 g Oral Daily   senna  1 tablet Oral Daily    Assessment: 62 y.o female with significant PMH of HTN, HLD, DM, sCHF, CAD, PAD (s/p of left common femoral-distal bypass, thrombectomy, angioplasty and stent placement 04/2023 on aspirin, Plavix and Eliquis), anxiety, bladder cancer (s/p of nephrectomy) who presented to the ED with chief complaints of left groin mass. Noted apixaban prior  to arrival with last dose documented 06/20/23 pm  Baseline Labs: INR 1.1, aPTT 35s, H&H/PLT stable  Goal of Therapy:  Heparin level 0.3-0.7 units/ml Monitor platelets by anticoagulation protocol: Yes  Heparin Level 12/20@2356  HL = 0.37 Therapeutic x1  12/21@0621  HL = 0.22 SUBtherapeutic   12/21@1349  HL = 0.44 Therapeutic x1  12/21@1942  HL = 0.50 Therapeutic x 2             Plan:   Heparin level therapeutic x 2 with current rate. Continue heparin infusion at 1500 units/hour and repeat HL with AM labs CBC at least once daily while on IV heparin  Idelia Caudell Rodriguez-Guzman PharmD, BCPS 06/27/2023 9:44 PM

## 2023-06-27 NOTE — Progress Notes (Signed)
PHARMACY - ANTICOAGULATION CONSULT NOTE  Pharmacy Consult for heparin infusion Indication: atrial fibrillation  No Known Allergies  Patient Measurements: Height: 5\' 7"  (170.2 cm) Weight: 74.5 kg (164 lb 3.9 oz) IBW/kg (Calculated) : 61.6 Heparin Dosing Weight: 64.1 kg  Vital Signs: Temp: 97.6 F (36.4 C) (12/21 2100) Temp Source: Oral (12/21 2100) BP: 155/141 (12/21 2000) Pulse Rate: 59 (12/21 2000)  Labs: Recent Labs    06/25/23 1954 06/26/23 0419 06/27/23 0621 06/27/23 0802 06/27/23 1349 06/27/23 1608 06/27/23 1942  HGB  --    < > 7.3* 10.2*  --  10.2*  --   HCT  --    < > 21.5* 30.1*  --  29.9*  --   PLT  --    < > 272 368  --  420*  --   APTT 46*  --   --   --   --   --   --   HEPARINUNFRC  --    < > 0.22*  --  0.44  --  0.50  CREATININE  --    < > 0.75 0.76 0.79  --   --    < > = values in this interval not displayed.    Estimated Creatinine Clearance: 74.9 mL/min (by C-G formula based on SCr of 0.79 mg/dL).   Medical History: Past Medical History:  Diagnosis Date   Absence of kidney    left   Anxiety    Arthritis    Atherosclerosis of native arteries of extremities with intermittent claudication, bilateral legs (HCC)    Bladder cancer (HCC)    CHF (congestive heart failure) (HCC)    Complication of anesthesia    BP HAS  RUN LOW AFTER SURGERY-LUNGS FILLED UP WITH FLUID AFTER  LEG STENT SURGERY    Coronary artery disease    Diabetes mellitus    Family history of adverse reaction to anesthesia    Sister - PONV   GERD (gastroesophageal reflux disease)    OCC TUMS   Heart murmur    Hemorrhoid    History of methicillin resistant staphylococcus aureus (MRSA) 2007   Hypertension    Neuropathy    PVD (peripheral vascular disease) (HCC)    Thyroid nodule    right   Unspecified osteoarthritis, unspecified site    Urothelial carcinoma of kidney (HCC) 10/31/2014   INVASIVE UROTHELIAL CARCINOMA, LOW GRADE. T1, Nx.   Vitamin D deficiency, unspecified     Wears dentures    full upper and lower    Medications:  Scheduled:   aspirin  81 mg Oral QHS   atorvastatin  20 mg Oral Daily   Chlorhexidine Gluconate Cloth  6 each Topical Daily   cholecalciferol  2,000 Units Oral Daily   gabapentin  300 mg Oral BID   insulin aspart  0-15 Units Subcutaneous TID AC & HS   insulin aspart  5 Units Subcutaneous TID WC   insulin glargine-yfgn  19 Units Subcutaneous QHS   midodrine  10 mg Oral BID WC   multivitamin with minerals  1 tablet Oral Daily   polyethylene glycol  17 g Oral Daily   senna  1 tablet Oral Daily    Assessment: 62 y.o female with significant PMH of HTN, HLD, DM, sCHF, CAD, PAD (s/p of left common femoral-distal bypass, thrombectomy, angioplasty and stent placement 04/2023 on aspirin, Plavix and Eliquis), anxiety, bladder cancer (s/p of nephrectomy) who presented to the ED with chief complaints of left groin mass. Noted apixaban prior  to arrival with last dose documented 06/20/23 pm  Baseline Labs: INR 1.1, aPTT 35s, H&H/PLT stable  Goal of Therapy:  Heparin level 0.3-0.7 units/ml Monitor platelets by anticoagulation protocol: Yes  Heparin Level 12/20@2356  HL = 0.37 Therapeutic x1  12/21@0621  HL = 0.22 SUBtherapeutic   12/21@1349  HL = 0.44 Therapeutic x1  12/21@1942  HL = 0.50 Therapeutic X 2             Plan:   12/21:  HL @ 1942 = 0.50, therapeutic X 2 - Will continue pt on current rate and recheck HL on 12/22 @ 0500.  CBC at least once daily while on IV heparin  Sharne Linders D, PharmD 06/27/2023 9:47 PM

## 2023-06-27 NOTE — Plan of Care (Signed)
  Problem: Nutritional: Goal: Maintenance of adequate nutrition will improve Outcome: Progressing   Problem: Tissue Perfusion: Goal: Adequacy of tissue perfusion will improve Outcome: Progressing   Problem: Activity: Goal: Risk for activity intolerance will decrease Outcome: Progressing

## 2023-06-27 NOTE — Progress Notes (Addendum)
      Daily Progress Note   Assessment/Planning:   POD #1 s/p L groin debridement, VAC POD #3 s/p L groin exc debridement, Sartorius muscle flap, VAC  L VAC adherent Reportedly scheduled for Monday additional washout and possible debridement of L groin BP soft.  Would transfuse 1 u pRBC HR also labile.  Sometimes in 100s and sometimes bradycardiac down into 30s   Subjective  - 1 Day Post-Op   No complaints   Objective   Vitals:   06/27/23 0200 06/27/23 0300 06/27/23 0400 06/27/23 0625  BP: (!) 113/46 (!) 108/53 (!) 115/57 112/68  Pulse: (!) 29 (!) 31 (!) 34 (!) 33  Resp: 11 20 15 15   Temp:   98.4 F (36.9 C)   TempSrc:   Oral   SpO2: 94% 97% 91% 94%  Weight:      Height:         Intake/Output Summary (Last 24 hours) at 06/27/2023 0857 Last data filed at 06/27/2023 0615 Gross per 24 hour  Intake 1235.81 ml  Output 200 ml  Net 1035.81 ml    VASC L VAC adherent    Laboratory   CBC    Latest Ref Rng & Units 06/27/2023    6:21 AM 06/26/2023    4:19 AM 06/25/2023    6:45 AM  CBC  WBC 4.0 - 10.5 K/uL 13.9  18.1  15.5   Hemoglobin 12.0 - 15.0 g/dL 7.3  9.3  9.5   Hematocrit 36.0 - 46.0 % 21.5  27.2  28.4   Platelets 150 - 400 K/uL 272  336  299     BMET    Component Value Date/Time   NA 141 06/27/2023 0621   NA 136 08/02/2021 1629   NA 135 11/02/2014 0609   K 3.3 (L) 06/27/2023 0621   K 4.2 11/02/2014 0609   CL 117 (H) 06/27/2023 0621   CL 107 11/02/2014 0609   CO2 14 (L) 06/27/2023 0621   CO2 23 11/02/2014 0609   GLUCOSE 108 (H) 06/27/2023 0621   GLUCOSE 108 (H) 11/02/2014 0609   BUN 16 06/27/2023 0621   BUN 25 08/02/2021 1629   BUN 20 11/02/2014 0609   CREATININE 0.75 06/27/2023 0621   CREATININE 1.01 11/09/2015 1549   CREATININE 1.01 11/09/2015 1549   CALCIUM 6.2 (LL) 06/27/2023 0621   CALCIUM 7.3 (L) 11/02/2014 0609   GFRNONAA >60 06/27/2023 0621   GFRNONAA 50 (L) 11/02/2014 0609   GFRAA >60 01/19/2019 0357   GFRAA 58 (L) 11/02/2014  8469     Leonides Sake, MD, FACS, FSVS Covering for  [facility] Ve[facility] Surgery: [phone]  06/27/2023, 8:57 AM

## 2023-06-28 DIAGNOSIS — B9620 Unspecified Escherichia coli [E. coli] as the cause of diseases classified elsewhere: Secondary | ICD-10-CM | POA: Diagnosis not present

## 2023-06-28 DIAGNOSIS — I739 Peripheral vascular disease, unspecified: Secondary | ICD-10-CM | POA: Diagnosis not present

## 2023-06-28 DIAGNOSIS — L02214 Cutaneous abscess of groin: Secondary | ICD-10-CM | POA: Diagnosis not present

## 2023-06-28 DIAGNOSIS — T8149XA Infection following a procedure, other surgical site, initial encounter: Secondary | ICD-10-CM | POA: Diagnosis not present

## 2023-06-28 LAB — CBC
HCT: 28.1 % — ABNORMAL LOW (ref 36.0–46.0)
Hemoglobin: 9.7 g/dL — ABNORMAL LOW (ref 12.0–15.0)
MCH: 29.4 pg (ref 26.0–34.0)
MCHC: 34.5 g/dL (ref 30.0–36.0)
MCV: 85.2 fL (ref 80.0–100.0)
Platelets: 422 10*3/uL — ABNORMAL HIGH (ref 150–400)
RBC: 3.3 MIL/uL — ABNORMAL LOW (ref 3.87–5.11)
RDW: 13.9 % (ref 11.5–15.5)
WBC: 24.7 10*3/uL — ABNORMAL HIGH (ref 4.0–10.5)
nRBC: 0 % (ref 0.0–0.2)

## 2023-06-28 LAB — RENAL FUNCTION PANEL
Albumin: 2 g/dL — ABNORMAL LOW (ref 3.5–5.0)
Anion gap: 5 (ref 5–15)
BUN: 22 mg/dL (ref 8–23)
CO2: 22 mmol/L (ref 22–32)
Calcium: 8.4 mg/dL — ABNORMAL LOW (ref 8.9–10.3)
Chloride: 107 mmol/L (ref 98–111)
Creatinine, Ser: 0.73 mg/dL (ref 0.44–1.00)
GFR, Estimated: >60 mL/min (ref >60)
Glucose, Bld: 79 mg/dL (ref 70–99)
Phosphorus: 2.9 mg/dL (ref 2.5–4.6)
Potassium: 3.9 mmol/L (ref 3.5–5.1)
Sodium: 134 mmol/L — ABNORMAL LOW (ref 135–145)

## 2023-06-28 LAB — GLUCOSE, CAPILLARY
Glucose-Capillary: 58 mg/dL — ABNORMAL LOW (ref 70–99)
Glucose-Capillary: 70 mg/dL (ref 70–99)
Glucose-Capillary: 107 mg/dL — ABNORMAL HIGH (ref 70–99)
Glucose-Capillary: 123 mg/dL — ABNORMAL HIGH (ref 70–99)
Glucose-Capillary: 193 mg/dL — ABNORMAL HIGH (ref 70–99)

## 2023-06-28 LAB — IRON AND TIBC
Iron: 37 ug/dL (ref 28–170)
Saturation Ratios: 18 % (ref 10.4–31.8)
TIBC: 206 ug/dL — ABNORMAL LOW (ref 250–450)
UIBC: 169 ug/dL

## 2023-06-28 LAB — CULTURE, BLOOD (ROUTINE X 2)
Culture: NO GROWTH
Special Requests: ADEQUATE

## 2023-06-28 LAB — FERRITIN: Ferritin: 86 ng/mL (ref 11–307)

## 2023-06-28 LAB — FOLATE: Folate: 11.4 ng/mL (ref >5.9)

## 2023-06-28 LAB — MAGNESIUM: Magnesium: 2 mg/dL (ref 1.7–2.4)

## 2023-06-28 LAB — PHOSPHORUS: Phosphorus: 2.9 mg/dL (ref 2.5–4.6)

## 2023-06-28 LAB — HEPARIN LEVEL (UNFRACTIONATED): Heparin Unfractionated: 0.39 [IU]/mL (ref 0.30–0.70)

## 2023-06-28 MED ORDER — INSULIN GLARGINE-YFGN 100 UNIT/ML ~~LOC~~ SOLN
15.0000 [IU] | Freq: Every day | SUBCUTANEOUS | Status: DC
  Administered 2023-06-28 – 2023-07-10 (×13): 15 [IU] via SUBCUTANEOUS
  Filled 2023-06-28 (×13): qty 0.15

## 2023-06-28 MED ORDER — PANTOPRAZOLE SODIUM 40 MG IV SOLR
40.0000 mg | INTRAVENOUS | Status: DC
Start: 2023-06-28 — End: 2023-07-03
  Administered 2023-06-28 – 2023-07-03 (×6): 40 mg via INTRAVENOUS
  Filled 2023-06-28 (×6): qty 10

## 2023-06-28 MED ORDER — DEXTROSE-SODIUM CHLORIDE 5-0.45 % IV SOLN
INTRAVENOUS | Status: AC
  Administered 2023-06-29: 100 mL via INTRAVENOUS

## 2023-06-28 MED ORDER — INSULIN ASPART 100 UNIT/ML IJ SOLN
3.0000 [IU] | Freq: Three times a day (TID) | INTRAMUSCULAR | Status: DC
  Administered 2023-06-29 – 2023-07-14 (×37): 3 [IU] via SUBCUTANEOUS
  Filled 2023-06-28 (×39): qty 1

## 2023-06-28 NOTE — Progress Notes (Signed)
Progress Note   Patient: Laura Reed WNU:[id number] DOB: December 21, 1960 DOA: 06/21/2023     7 DOS: the patient was seen and examined on 06/28/2023   Brief hospital course: 62 y.o female with significant PMH of HTN, HLD, DM, sCHF, CAD, PAD (s/p of left common femoral-distal bypass, thrombectomy, angioplasty and stent placement 04/2023 on aspirin, Plavix and Eliquis), anxiety, bladder cancer (s/p of nephrectomy) who presented to the ED with chief complaints of    Patient presented to [facility] urgent care on 06/21/23 with complaints of right groin pain, redness and oozing post op. Patient has hx of PAD and recently underwent left common femoral-distal bypass, thrombectomy, angioplasty and stent placement 04/2023  and right transmetatarsal amputation. States that she has been having left leg swelling. No weeping. Some pitting. She thinks she has been having fever. She was noted to be hypotensive with SBP in the 50s and was sent to the ED for further evaluation.  Patient required vasopressors 12/16.  She went to the OR 12/18 and 200 cc of pus were drained from her left groin.  Her blood pressure stabilized off of pressors and she was transferred to stepdown unit.  Infectious disease was consulted and patient was continued on antibiotics.  Assessment and Plan:  Left groin abscess Sepsis with septic shock resolved Status post I&D with vascular surgery 12/18 with takeback to the OR for left groin debridement, muscle flap, wound VAC exchange 12/20  -continue antimicrobials per ID, currently on Unasyn -Pain control  PAD s/p of left common femoral-tibioperoneal trunk bypass, thrombectomy, angioplasty and stent placement 04/2023  on aspirin, Plavix and Eliquis  -Currently on heparin infusion   Leukocytosis  In the setting of her infection and multiple trips to the OR.  She remains afebrile    Latest Ref Rng & Units 06/28/2023    3:39 AM 06/27/2023    4:08 PM 06/27/2023    8:02 AM  CBC  WBC 4.0 - 10.5  K/uL 24.7  30.2  21.9   Hemoglobin 12.0 - 15.0 g/dL 9.7  03.4  74.2   Hematocrit 36.0 - 46.0 % 28.1  29.9  30.1   Platelets 150 - 400 K/uL 422  420  368    -Continue to monitor     Normocytic anemia    Latest Ref Rng & Units 06/28/2023    3:39 AM 06/27/2023    4:08 PM 06/27/2023    8:02 AM  CBC  WBC 4.0 - 10.5 K/uL 24.7  30.2  21.9   Hemoglobin 12.0 - 15.0 g/dL 9.7  59.5  63.8   Hematocrit 36.0 - 46.0 % 28.1  29.9  30.1   Platelets 150 - 400 K/uL 422  420  368   Unclear exact etiology previously had normal hemoglobin prior to vascular procedures.  This a.m. patient's hemoglobin was noted to be 7.3 from previously stable hemoglobin in the 9.0s.  Repeat hemoglobin 10.2.  Suspect 7.3 was erroneous.  Patient does have some blood tinge output in her wound VAC but does not appear large enough to account for 2 point blood loss.  Patient was given 1 unit of packed red blood cells by vascular team. Ferrtin 86, TSAT 18. Folate WNL. -F/u b12 -Consider iron supplemntation  -Continue to monitor hemoglobin  AKI resolved    Latest Ref Rng & Units 06/28/2023    3:40 AM 06/27/2023    1:49 PM 06/27/2023    8:02 AM  BMP  Glucose 70 - 99 mg/dL 79  756  162   BUN 8 - 23 mg/dL 22  24  21    Creatinine 0.44 - 1.00 mg/dL 1.47  8.29  5.62   Sodium 135 - 145 mmol/L 134  134  135   Potassium 3.5 - 5.1 mmol/L 3.9  4.7  5.0   Chloride 98 - 111 mmol/L 107  108  109   CO2 22 - 32 mmol/L 22  19  22    Calcium 8.9 - 10.3 mg/dL 8.4  8.6  8.6     PVC Patient noted to have frequent PVCs on the monitor in room. She is asymptomatic from these. This is likely causing her heart rate to appear to be lower than normal.  -Will ensure potassium >4, Magnesium>2 -Continue to monitor  Hyponatremia  Mild on AM labs.  Continue to monitor with improving PO intake.   Chronic diastolic HF No evidence of volume overload   Hypoalbuminemia   HTN  HLD Continue statin Hold home antihypertensives. Mostly normotensive  ON. -Hold midodrine -Continue to monitor blood pressure  T2DM  Poorly controlled  A1c 8 Hypoglycemia w/ BG to 58 this AM.  Decrease insulin, while patient has poor oral intake. -Decrease long-acting to 15 units -Decrease mealtime to 3 units 3 times daily      Subjective:  Patient without any chest pain or shortness of breath.  She states that she is having a poor appetite.  She feels like she is having reflux symptoms.  Physical Exam: Vitals:   06/28/23 0019 06/28/23 0123 06/28/23 0727 06/28/23 1627  BP: (!) 123/47 123/75 102/61 (!) 135/46  Pulse: (!) 54 (!) 54 (!) 57 95  Resp: 13 18 14 17   Temp:  98.3 F (36.8 C) 98.1 F (36.7 C) 97.9 F (36.6 C)  TempSrc:  Oral Oral   SpO2: 96% 98% 99% 98%  Weight:      Height:        Physical Exam  Constitutional: In no distress.  Cardiovascular: Normal rate, regular rhythm. 2+ edema LLE Pulmonary: Non labored breathing on room air, no wheezing or rales. Abdominal: Soft. Normal bowel sounds. Non distended and non tender Musculoskeletal:     Minimal TTP of L thigh, wound vac  in place in L groin holding suction S/p L TMA, Neurological: Alert and oriented to person, place, and time. Non focal  Skin: Skin is warm and dry.   Data Reviewed:  I have reviewed all labs and images.     Latest Ref Rng & Units 06/28/2023    3:39 AM 06/27/2023    4:08 PM 06/27/2023    8:02 AM  CBC  WBC 4.0 - 10.5 K/uL 24.7  30.2  21.9   Hemoglobin 12.0 - 15.0 g/dL 9.7  13.0  86.5   Hematocrit 36.0 - 46.0 % 28.1  29.9  30.1   Platelets 150 - 400 K/uL 422  420  368       Latest Ref Rng & Units 06/28/2023    3:40 AM 06/27/2023    1:49 PM 06/27/2023    8:02 AM  BMP  Glucose 70 - 99 mg/dL 79  784  696   BUN 8 - 23 mg/dL 22  24  21    Creatinine 0.44 - 1.00 mg/dL 2.95  2.84  1.32   Sodium 135 - 145 mmol/L 134  134  135   Potassium 3.5 - 5.1 mmol/L 3.9  4.7  5.0   Chloride 98 - 111 mmol/L 107  108  109   CO2  22 - 32 mmol/L 22  19  22    Calcium 8.9  - 10.3 mg/dL 8.4  8.6  8.6     Disposition: Status is: Inpatient Remains inpatient appropriate because: LLE abscess requiring OR washout and frequent trips back to the OR, IV antibiotics   Planned Discharge Destination:  Pending    Heparin infusion  Time spent: 35 minutes  Author: Marolyn Haller, MD 06/28/2023 5:29 PM  For on call review [email or web address].

## 2023-06-28 NOTE — Progress Notes (Signed)
A&O patient transported to room 145 via bed by charge RN and CNA. Patients belongings placed in patient belonging bags and put in the bed with the patient for transport.Heparin gtt continues to infuse at 1500 units/hr per order and wound vac continues to drain serosanguineous drainage. Dressing to left groin clean dry and intact.

## 2023-06-28 NOTE — Progress Notes (Signed)
PHARMACY - ANTICOAGULATION CONSULT NOTE  Pharmacy Consult for heparin infusion Indication: atrial fibrillation  No Known Allergies  Patient Measurements: Height: 5\' 7"  (170.2 cm) Weight: 74.5 kg (164 lb 3.9 oz) IBW/kg (Calculated) : 61.6 Heparin Dosing Weight: 64.1 kg  Vital Signs: Temp: 98.3 F (36.8 C) (12/22 0123) Temp Source: Oral (12/22 0123) BP: 123/75 (12/22 0123) Pulse Rate: 54 (12/22 0123)  Labs: Recent Labs    06/25/23 1954 06/26/23 0419 06/27/23 0621 06/27/23 0802 06/27/23 1349 06/27/23 1608 06/27/23 1942 06/28/23 0339  HGB  --    < > 7.3* 10.2*  --  10.2*  --  9.7*  HCT  --    < > 21.5* 30.1*  --  29.9*  --  28.1*  PLT  --    < > 272 368  --  420*  --  422*  APTT 46*  --   --   --   --   --   --   --   HEPARINUNFRC  --    < > 0.22*  --  0.44  --  0.50 0.39  CREATININE  --    < > 0.75 0.76 0.79  --   --   --    < > = values in this interval not displayed.    Estimated Creatinine Clearance: 74.9 mL/min (by C-G formula based on SCr of 0.79 mg/dL).   Medical History: Past Medical History:  Diagnosis Date   Absence of kidney    left   Anxiety    Arthritis    Atherosclerosis of native arteries of extremities with intermittent claudication, bilateral legs (HCC)    Bladder cancer (HCC)    CHF (congestive heart failure) (HCC)    Complication of anesthesia    BP HAS  RUN LOW AFTER SURGERY-LUNGS FILLED UP WITH FLUID AFTER  LEG STENT SURGERY    Coronary artery disease    Diabetes mellitus    Family history of adverse reaction to anesthesia    Sister - PONV   GERD (gastroesophageal reflux disease)    OCC TUMS   Heart murmur    Hemorrhoid    History of methicillin resistant staphylococcus aureus (MRSA) 2007   Hypertension    Neuropathy    PVD (peripheral vascular disease) (HCC)    Thyroid nodule    right   Unspecified osteoarthritis, unspecified site    Urothelial carcinoma of kidney (HCC) 10/31/2014   INVASIVE UROTHELIAL CARCINOMA, LOW GRADE. T1,  Nx.   Vitamin D deficiency, unspecified    Wears dentures    full upper and lower    Medications:  Scheduled:   aspirin  81 mg Oral QHS   atorvastatin  20 mg Oral Daily   Chlorhexidine Gluconate Cloth  6 each Topical Daily   cholecalciferol  2,000 Units Oral Daily   gabapentin  300 mg Oral BID   insulin aspart  0-15 Units Subcutaneous TID AC & HS   insulin aspart  5 Units Subcutaneous TID WC   insulin glargine-yfgn  19 Units Subcutaneous QHS   midodrine  10 mg Oral BID WC   multivitamin with minerals  1 tablet Oral Daily   polyethylene glycol  17 g Oral Daily   senna  1 tablet Oral Daily    Assessment: 62 y.o female with significant PMH of HTN, HLD, DM, sCHF, CAD, PAD (s/p of left common femoral-distal bypass, thrombectomy, angioplasty and stent placement 04/2023 on aspirin, Plavix and Eliquis), anxiety, bladder cancer (s/p of nephrectomy) who presented to  the ED with chief complaints of left groin mass. Noted apixaban prior to arrival with last dose documented 06/20/23 pm  Baseline Labs: INR 1.1, aPTT 35s, H&H/PLT stable  Goal of Therapy:  Heparin level 0.3-0.7 units/ml Monitor platelets by anticoagulation protocol: Yes  Heparin Level 12/20@2356  HL = 0.37 Therapeutic x1  12/21@0621  HL = 0.22 SUBtherapeutic   12/21@1349  HL = 0.44 Therapeutic x1  12/21@1942  HL = 0.50 Therapeutic X 2  12/22@0339   HL = 0.39 Therapeutic X 3         Plan:   12/22:  HL @ 0339 = 0.39, therapeutic X 3 - Will continue pt on current rate and recheck HL on 12/23 with AM labs.  CBC at least once daily while on IV heparin  Jaime Grizzell D, PharmD 06/28/2023 4:17 AM

## 2023-06-28 NOTE — Progress Notes (Signed)
Physical Therapy Treatment Patient Details Name: Laura Reed MRN: [id number] DOB: 06-25-1961 Today's Date: 06/28/2023   History of Present Illness Laura Reed  has presented today for surgery, with the diagnosis of Groin Infection. S/P I & D left groin. On April 29, 2023 she underwent a femoral to tibioperoneal trunk bypass using a PTFE distal flow graft.  She was noted to thrombose within the first 48 hours and subsequently underwent angiography with salvage of her bypass and stenting of the anterior tibial artery.  Following the second procedure which was May 01, 2023 she did well and was discharged to home approximately 10 days later.    PT Comments  Up in chair, wanting to walk.  Stands with CGA x 1. And is able to walk 13' with slow but generally steady gait  with RW and cga x 1.  Overall does well with good safety and general awareness of limitations.  She does voice some hesitancy about going up/down her steps at home to access house and would benefit from stair training before discharge.     If plan is discharge home, recommend the following: A little help with walking and/or transfers;A little help with bathing/dressing/bathroom;Assistance with cooking/housework;Help with stairs or ramp for entrance;Assist for transportation   Can travel by private vehicle        Equipment Recommendations  Rolling walker (2 wheels) (pt stated she has RW from [employer] that is borrowed as DME complany never deleviered hers and she needs to return it to SNF)    Recommendations for Other Services       Precautions / Restrictions Precautions Precautions: Fall Restrictions Weight Bearing Restrictions Per Provider Order: No     Mobility  Bed Mobility               General bed mobility comments: in chair before and after Patient Response: Cooperative  Transfers Overall transfer level: Needs assistance Equipment used: Rolling walker (2 wheels) Transfers: Sit to/from  Stand Sit to Stand: Contact guard assist                Ambulation/Gait Ambulation/Gait assistance: Contact guard assist Gait Distance (Feet): 70 Feet Assistive device: Rolling walker (2 wheels) Gait Pattern/deviations: Step-to pattern, Decreased step length - right, Decreased step length - left, Decreased stride length, Trunk flexed Gait velocity: decr     General Gait Details: generally slow but steady gait   Stairs             Wheelchair Mobility     Tilt Bed Tilt Bed Patient Response: Cooperative  Modified Rankin (Stroke Patients Only)       Balance Overall balance assessment: Needs assistance Sitting-balance support: Feet supported Sitting balance-Leahy Scale: Good     Standing balance support: Bilateral upper extremity supported, During functional activity, Reliant on assistive device for balance Standing balance-Leahy Scale: Fair                              Cognition Arousal: Alert Behavior During Therapy: WFL for tasks assessed/performed Overall Cognitive Status: Within Functional Limits for tasks assessed                                          Exercises      General Comments        Pertinent Vitals/Pain Pain Assessment Pain  Assessment: 0-10 Pain Score: 1  Pain Location: L groin Pain Descriptors / Indicators: Discomfort, Sore Pain Intervention(s): Limited activity within patient's tolerance, Monitored during session, Repositioned    Home Living                          Prior Function            PT Goals (current goals can now be found in the care plan section) Progress towards PT goals: Progressing toward goals    Frequency    Min 1X/week      PT Plan      Co-evaluation              AM-PAC PT "6 Clicks" Mobility   Outcome Measure  Help needed turning from your back to your side while in a flat bed without using bedrails?: A Little Help needed moving from lying on  your back to sitting on the side of a flat bed without using bedrails?: A Little Help needed moving to and from a bed to a chair (including a wheelchair)?: A Little Help needed standing up from a chair using your arms (e.g., wheelchair or bedside chair)?: A Little Help needed to walk in hospital room?: A Little Help needed climbing 3-5 steps with a railing? : A Little 6 Click Score: 18    End of Session Equipment Utilized During Treatment: Gait belt Activity Tolerance: Patient tolerated treatment well Patient left: in chair;with call bell/phone within reach;with chair alarm set;with family/visitor present Nurse Communication: Mobility status PT Visit Diagnosis: Other abnormalities of gait and mobility (R26.89);Unsteadiness on feet (R26.81);Pain;Difficulty in walking, not elsewhere classified (R26.2);Muscle weakness (generalized) (M62.81) Pain - Right/Left: Left Pain - part of body: Leg     Time: 1610-9604 PT Time Calculation (min) (ACUTE ONLY): 1402 min  Charges:    $Gait Training: 8-22 mins PT General Charges $$ ACUTE PT VISIT: 1 Visit         Danielle Dess, PTA 06/28/23, 2:29 PM

## 2023-06-28 NOTE — Progress Notes (Signed)
      Daily Progress Note   Assessment/Planning:   POD #2 s/p L groin debridement, VAC POD #4 s/p L groin exc debridement, sartorius muscle flap, VAC  OR tomorrow.  NPO after MN Bradycardia somewhat better BP better Appears initial CBC yesterday was falsely decreased   Subjective  - 2 Days Post-Op   No complaints   Objective   Vitals:   06/28/23 0011 06/28/23 0019 06/28/23 0123 06/28/23 0727  BP: (!) 104/44 (!) 123/47 123/75 102/61  Pulse: (!) 29 (!) 54 (!) 54 (!) 57  Resp: 13 13 18 14   Temp:   98.3 F (36.8 C) 98.1 F (36.7 C)  TempSrc:   Oral Oral  SpO2: 97% 96% 98% 99%  Weight:      Height:         Intake/Output Summary (Last 24 hours) at 06/28/2023 0757 Last data filed at 06/28/2023 0100 Gross per 24 hour  Intake 639.9 ml  Output 400 ml  Net 239.9 ml    VASC L VAC adherent, L foot warm, motor and sensation intact    Laboratory   CBC    Latest Ref Rng & Units 06/28/2023    3:39 AM 06/27/2023    4:08 PM 06/27/2023    8:02 AM  CBC  WBC 4.0 - 10.5 K/uL 24.7  30.2  21.9   Hemoglobin 12.0 - 15.0 g/dL 9.7  41.3  24.4   Hematocrit 36.0 - 46.0 % 28.1  29.9  30.1   Platelets 150 - 400 K/uL 422  420  368     BMET    Component Value Date/Time   NA 134 (L) 06/28/2023 0340   NA 136 08/02/2021 1629   NA 135 11/02/2014 0609   K 3.9 06/28/2023 0340   K 4.2 11/02/2014 0609   CL 107 06/28/2023 0340   CL 107 11/02/2014 0609   CO2 22 06/28/2023 0340   CO2 23 11/02/2014 0609   GLUCOSE 79 06/28/2023 0340   GLUCOSE 108 (H) 11/02/2014 0609   BUN 22 06/28/2023 0340   BUN 25 08/02/2021 1629   BUN 20 11/02/2014 0609   CREATININE 0.73 06/28/2023 0340   CREATININE 1.01 11/09/2015 1549   CREATININE 1.01 11/09/2015 1549   CALCIUM 8.4 (L) 06/28/2023 0340   CALCIUM 7.3 (L) 11/02/2014 0609   GFRNONAA >60 06/28/2023 0340   GFRNONAA 50 (L) 11/02/2014 0609   GFRAA >60 01/19/2019 0357   GFRAA 58 (L) 11/02/2014 0102     Leonides Sake, MD, FACS, FSVS Covering for  [facility]: [phone]  06/28/2023, 7:57 AM

## 2023-06-28 NOTE — Progress Notes (Signed)
Date of Admission:  06/21/2023      ID: Laura Reed is a 62 y.o. female with   Principal Problem:   Left leg pain Active Problems:   Hypertension   PVD (peripheral vascular disease) (HCC)   Hyperlipidemia associated with type 2 diabetes mellitus (HCC)   CAD (coronary artery disease)   Type II diabetes mellitus with peripheral circulatory disorder (HCC)   AKI (acute kidney injury) (HCC)   Chronic diastolic CHF (congestive heart failure) (HCC)   Hyponatremia   Diarrhea   Leukocytosis   Hypophosphatemia   Normocytic anemia   Abscess of groin, left    Subjective: Some cough No fever  Medications:   aspirin  81 mg Oral QHS   atorvastatin  20 mg Oral Daily   Chlorhexidine Gluconate Cloth  6 each Topical Daily   cholecalciferol  2,000 Units Oral Daily   gabapentin  300 mg Oral BID   insulin aspart  0-15 Units Subcutaneous TID AC & HS   [START ON 06/29/2023] insulin aspart  3 Units Subcutaneous TID WC   insulin glargine-yfgn  15 Units Subcutaneous QHS   multivitamin with minerals  1 tablet Oral Daily   pantoprazole (PROTONIX) IV  40 mg Intravenous Q24H   polyethylene glycol  17 g Oral Daily   senna  1 tablet Oral Daily    Objective: Vital signs in last 24 hours: Patient Vitals for the past 24 hrs:  BP Temp Temp src Pulse Resp SpO2  06/28/23 1627 (!) 135/46 97.9 F (36.6 C) -- 95 17 98 %  06/28/23 0727 102/61 98.1 F (36.7 C) Oral (!) 57 14 99 %  06/28/23 0123 123/75 98.3 F (36.8 C) Oral (!) 54 18 98 %  06/28/23 0019 (!) 123/47 -- -- (!) 54 13 96 %  06/28/23 0011 (!) 104/44 -- -- (!) 29 13 97 %  06/27/23 2111 123/71 -- -- (!) 31 13 100 %  06/27/23 2100 -- 97.6 F (36.4 C) Oral -- -- --  06/27/23 2000 (!) 155/141 -- -- (!) 59 13 100 %  06/27/23 1900 135/64 -- -- 63 12 100 %  06/27/23 1822 -- -- -- 62 15 100 %  06/27/23 1800 99/82 -- -- 64 12 100 %      PHYSICAL EXAM:  General: Alert, cooperative, no distress, appears stated age.  Lungs: Clear to  auscultation bilaterally. No Wheezing or Rhonchi. No rales. Heart: Regular rate and rhythm, no murmur, rub or gallop. Abdomen: Soft, non-tender,not distended. Bowel sounds normal. No masses Extremities:left groin wound vac Skin: No rashes or lesions. Or bruising Lymph: Cervical, supraclavicular normal. Neurologic: Grossly non-focal  Lab Results    Latest Ref Rng & Units 06/28/2023    3:39 AM 06/27/2023    4:08 PM 06/27/2023    8:02 AM  CBC  WBC 4.0 - 10.5 K/uL 24.7  30.2  21.9   Hemoglobin 12.0 - 15.0 g/dL 9.7  16.1  09.6   Hematocrit 36.0 - 46.0 % 28.1  29.9  30.1   Platelets 150 - 400 K/uL 422  420  368        Latest Ref Rng & Units 06/28/2023    3:40 AM 06/27/2023    1:49 PM 06/27/2023    8:02 AM  CMP  Glucose 70 - 99 mg/dL 79  045  409   BUN 8 - 23 mg/dL 22  24  21    Creatinine 0.44 - 1.00 mg/dL 8.11  9.14  7.82   Sodium  135 - 145 mmol/L 134  134  135   Potassium 3.5 - 5.1 mmol/L 3.9  4.7  5.0   Chloride 98 - 111 mmol/L 107  108  109   CO2 22 - 32 mmol/L 22  19  22    Calcium 8.9 - 10.3 mg/dL 8.4  8.6  8.6       Microbiology: WC - ecoli-pansensitive Studies/Results: No results found.    Assessment/Plan: PAD s/p left common femoral artery to tibioperoneal trunk artery bypass  with PTFE graft in OCT 2024  Abscess due to Ecoli  at the surgical site- CT showed  10 cm complex collection s/p excisional debridement of the abscess and rotation of a sarorius muscle flap for coverage of existing bypass grafts  Ecoli in culture so far Pan sensitive- on unasyn  Leucocytosis   Left TMA- healed well  ecoli and enterococcus in  culture in sept when she had TMA  CAD s/p CABG   H/o bladder ca s/p left nephrectomy Solitary kidney- avoid nephrotoxic drugs   AKI-  resolved  Discussed the management with the patient

## 2023-06-28 NOTE — H&P (View-Only) (Signed)
      Daily Progress Note   Assessment/Planning:   POD #2 s/p L groin debridement, VAC POD #4 s/p L groin exc debridement, sartorius muscle flap, VAC  OR tomorrow.  NPO after MN Bradycardia somewhat better BP better Appears initial CBC yesterday was falsely decreased   Subjective  - 2 Days Post-Op   No complaints   Objective   Vitals:   06/28/23 0011 06/28/23 0019 06/28/23 0123 06/28/23 0727  BP: (!) 104/44 (!) 123/47 123/75 102/61  Pulse: (!) 29 (!) 54 (!) 54 (!) 57  Resp: 13 13 18 14   Temp:   98.3 F (36.8 C) 98.1 F (36.7 C)  TempSrc:   Oral Oral  SpO2: 97% 96% 98% 99%  Weight:      Height:         Intake/Output Summary (Last 24 hours) at 06/28/2023 0757 Last data filed at 06/28/2023 0100 Gross per 24 hour  Intake 639.9 ml  Output 400 ml  Net 239.9 ml    VASC L VAC adherent, L foot warm, motor and sensation intact    Laboratory   CBC    Latest Ref Rng & Units 06/28/2023    3:39 AM 06/27/2023    4:08 PM 06/27/2023    8:02 AM  CBC  WBC 4.0 - 10.5 K/uL 24.7  30.2  21.9   Hemoglobin 12.0 - 15.0 g/dL 9.7  41.3  24.4   Hematocrit 36.0 - 46.0 % 28.1  29.9  30.1   Platelets 150 - 400 K/uL 422  420  368     BMET    Component Value Date/Time   NA 134 (L) 06/28/2023 0340   NA 136 08/02/2021 1629   NA 135 11/02/2014 0609   K 3.9 06/28/2023 0340   K 4.2 11/02/2014 0609   CL 107 06/28/2023 0340   CL 107 11/02/2014 0609   CO2 22 06/28/2023 0340   CO2 23 11/02/2014 0609   GLUCOSE 79 06/28/2023 0340   GLUCOSE 108 (H) 11/02/2014 0609   BUN 22 06/28/2023 0340   BUN 25 08/02/2021 1629   BUN 20 11/02/2014 0609   CREATININE 0.73 06/28/2023 0340   CREATININE 1.01 11/09/2015 1549   CREATININE 1.01 11/09/2015 1549   CALCIUM 8.4 (L) 06/28/2023 0340   CALCIUM 7.3 (L) 11/02/2014 0609   GFRNONAA >60 06/28/2023 0340   GFRNONAA 50 (L) 11/02/2014 0609   GFRAA >60 01/19/2019 0357   GFRAA 58 (L) 11/02/2014 0102     Leonides Sake, MD, FACS, FSVS [facility]: [phone]  06/28/2023, 7:57 AM

## 2023-06-28 NOTE — Progress Notes (Signed)
PHARMACY CONSULT NOTE  Pharmacy Consult for Electrolyte Monitoring and Replacement   Recent Labs: Potassium (mmol/L)  Date Value  06/28/2023 3.9  11/02/2014 4.2   Magnesium (mg/dL)  Date Value  40/98/1191 2.0  06/03/2013 2.0   Calcium (mg/dL)  Date Value  47/82/9562 8.4 (L)   Calcium, Total (mg/dL)  Date Value  13/02/6577 7.3 (L)   Albumin (g/dL)  Date Value  46/96/2952 2.0 (L)  08/02/2021 4.3  06/03/2013 2.2 (L)   Phosphorus (mg/dL)  Date Value  84/13/2440 2.9   Sodium (mmol/L)  Date Value  06/28/2023 134 (L)  08/02/2021 136  11/02/2014 135    Assessment: 62 y.o female with significant PMH of HTN, HLD, DM, sCHF, CAD, PAD (s/p of left common femoral-distal bypass, thrombectomy, angioplasty and stent placement 04/2023 on aspirin, Plavix and Eliquis), anxiety, bladder cancer (s/p of nephrectomy) who presented to the ED with chief complaints of left groin mass.   Goal of Therapy:  WNL  Plan:  --Electrolytes have been stable and not requiring replacement --Will sign off on electrolyte consult. Pharmacy will monitor peripherally  Tressie Ellis 06/28/2023 7:55 AM

## 2023-06-28 NOTE — Evaluation (Signed)
Occupational Therapy Evaluation Patient Details Name: Laura Reed MRN: [id number] DOB: 1961-02-08 Today's Date: 06/28/2023   History of Present Illness Laura Reed  has presented today for surgery, with the diagnosis of Groin Infection. S/P I & D left groin. On April 29, 2023 she underwent a femoral to tibioperoneal trunk bypass using a PTFE distal flow graft.  She was noted to thrombose within the first 48 hours and subsequently underwent angiography with salvage of her bypass and stenting of the anterior tibial artery.  Following the second procedure which was May 01, 2023 she did well and was discharged to home approximately 10 days later.   Clinical Impression   Pt was seen for OT evaluation this date. Pt lives alone and has family who can assist PRN. Pt eager to improve strength and endurance to remain safe and independent. Pt presents to acute OT demonstrating impaired ADL performance and functional mobility 2/2 decreased BLE strength, activity tolerance, and discomfort at groin incision site (See OT problem list for additional functional deficits). Pt currently requires CGA for ADL transfers, RW for ADL mobility, and PRN MIN A for LB ADL tasks. Pt/family educated in home/routines modifications, energy conservation strategies to promote safety/indep with ADL/mobility, falls prevention, AE/DME for ADL particularly showering, and pet care considerations. Pt/sister verbalized understanding.  Pt would benefit from skilled OT services to address noted impairments and functional limitations (see below for any additional details) in order to maximize safety and independence while minimizing falls risk and caregiver burden.     If plan is discharge home, recommend the following: A little help with walking and/or transfers;A little help with bathing/dressing/bathroom;Assistance with cooking/housework;Assist for transportation;Help with stairs or ramp for entrance    Functional Status  Assessment  Patient has had a recent decline in their functional status and demonstrates the ability to make significant improvements in function in a reasonable and predictable amount of time.  Equipment Recommendations  Tub/shower seat;Other (comment) (handheld shower chair, anti-skid tape in shower floor)    Recommendations for Other Services       Precautions / Restrictions Precautions Precautions: Fall Restrictions Weight Bearing Restrictions Per Provider Order: No      Mobility Bed Mobility               General bed mobility comments: in chair before and after    Transfers Overall transfer level: Needs assistance Equipment used: Rolling walker (2 wheels) Transfers: Sit to/from Stand Sit to Stand: Contact guard assist                  Balance Overall balance assessment: Needs assistance Sitting-balance support: Feet supported Sitting balance-Leahy Scale: Good     Standing balance support: Bilateral upper extremity supported, During functional activity, Reliant on assistive device for balance Standing balance-Leahy Scale: Fair                             ADL either performed or assessed with clinical judgement   ADL                                         General ADL Comments: Pt able to demonstrate ability to use figure 4 technique for LB ADL tasks without direct assist. CGA for ADL transfers and mobility.     Vision         Perception  Praxis         Pertinent Vitals/Pain Pain Assessment Pain Assessment: 0-10 Pain Score: 2  Pain Location: L groin Pain Descriptors / Indicators: Discomfort, Sore Pain Intervention(s): Limited activity within patient's tolerance, Monitored during session, Premedicated before session, Repositioned     Extremity/Trunk Assessment Upper Extremity Assessment Upper Extremity Assessment: Overall WFL for tasks assessed   Lower Extremity Assessment LLE Deficits / Details:  painlimited 2/2 BLE swelling and L groin pain       Communication Communication Communication: No apparent difficulties   Cognition Arousal: Alert Behavior During Therapy: WFL for tasks assessed/performed Overall Cognitive Status: Within Functional Limits for tasks assessed                                 General Comments: chatty     General Comments       Exercises Other Exercises Other Exercises: Pt/family educated in home/routines modifications, energy conservation strategies to promote safety/indep with ADL/mobility, falls prevention, AE/DME for ADL particularly showering, and pet care considerations.   Shoulder Instructions      Home Living Family/patient expects to be discharged to:: Private residence Living Arrangements: Alone Available Help at Discharge: Family;Available PRN/intermittently (son and sister) Type of Home: House Home Access: Stairs to enter [employer] of Steps: 2 Entrance Stairs-Rails: Right;Left;Can reach both Home Layout: One level     Bathroom Shower/Tub: Walk-in shower         Home Equipment: Agricultural consultant (2 wheels);Adaptive equipment Adaptive Equipment: Reacher Additional Comments: has rolling walker, was not using      Prior Functioning/Environment Prior Level of Function : Independent/Modified Independent;Driving;History of Falls (last six months)             Mobility Comments: had progressed with mobility and was not using AD ADLs Comments: independent; 1 fall at rehab ~19mo ago        OT Problem List: Decreased activity tolerance;Pain;Impaired balance (sitting and/or standing);Decreased knowledge of use of DME or AE;Decreased strength      OT Treatment/Interventions: Self-care/ADL training;Therapeutic activities;Therapeutic exercise;DME and/or AE instruction;Patient/family education;Balance training;Energy conservation    OT Goals(Current goals can be found in the care plan section) Acute Rehab OT  Goals Patient Stated Goal: get stronger and be independent OT Goal Formulation: With patient/family Time For Goal Achievement: 07/12/23 Potential to Achieve Goals: Good ADL Goals Additional ADL Goal #1: Pt will verbalize plan to implement at least 2 learned ECS to support safety with ADL/mobility. Additional ADL Goal #2: Pt will complete all aspects of a shower primarily from seated position wiht modified independence, incorporating learned ECS without VC, 1/1 opportunity.  OT Frequency: Min 1X/week    Co-evaluation              AM-PAC OT "6 Clicks" Daily Activity     Outcome Measure Help from another person eating meals?: None Help from another person taking care of personal grooming?: None Help from another person toileting, which includes using toliet, bedpan, or urinal?: A Little Help from another person bathing (including washing, rinsing, drying)?: A Little Help from another person to put on and taking off regular upper body clothing?: None Help from another person to put on and taking off regular lower body clothing?: A Little 6 Click Score: 21   End of Session    Activity Tolerance: Patient tolerated treatment well Patient left: in chair;with call bell/phone within reach;with chair alarm set;with family/visitor present  OT Visit  Diagnosis: Other abnormalities of gait and mobility (R26.89)                Time: 1610-9604 OT Time Calculation (min): 27 min Charges:  OT General Charges $OT Visit: 1 Visit OT Evaluation $OT Eval Low Complexity: 1 Low OT Treatments $Self Care/Home Management : 8-22 mins  Arman Filter., MPH, MS, OTR/L ascom [phone] 06/28/23, 3:40 PM

## 2023-06-29 ENCOUNTER — Other Ambulatory Visit: Payer: Self-pay

## 2023-06-29 ENCOUNTER — Inpatient Hospital Stay: Payer: 59

## 2023-06-29 ENCOUNTER — Encounter: Payer: Self-pay | Admitting: Internal Medicine

## 2023-06-29 ENCOUNTER — Encounter
Admission: EM | Disposition: A | Discharge status: Home Health | Payer: Self-pay | Source: Home / Self Care | Attending: Internal Medicine

## 2023-06-29 DIAGNOSIS — T827XXA Infection and inflammatory reaction due to other cardiac and vascular devices, implants and grafts, initial encounter: Secondary | ICD-10-CM | POA: Diagnosis not present

## 2023-06-29 DIAGNOSIS — T8149XA Infection following a procedure, other surgical site, initial encounter: Secondary | ICD-10-CM | POA: Diagnosis not present

## 2023-06-29 DIAGNOSIS — I70203 Unspecified atherosclerosis of native arteries of extremities, bilateral legs: Secondary | ICD-10-CM | POA: Diagnosis not present

## 2023-06-29 DIAGNOSIS — Z9889 Other specified postprocedural states: Secondary | ICD-10-CM | POA: Diagnosis not present

## 2023-06-29 DIAGNOSIS — B9620 Unspecified Escherichia coli [E. coli] as the cause of diseases classified elsewhere: Secondary | ICD-10-CM | POA: Diagnosis not present

## 2023-06-29 DIAGNOSIS — I739 Peripheral vascular disease, unspecified: Secondary | ICD-10-CM | POA: Diagnosis not present

## 2023-06-29 DIAGNOSIS — L02214 Cutaneous abscess of groin: Secondary | ICD-10-CM | POA: Diagnosis not present

## 2023-06-29 DIAGNOSIS — I97648 Postprocedural seroma of a circulatory system organ or structure following other circulatory system procedure: Secondary | ICD-10-CM | POA: Diagnosis not present

## 2023-06-29 HISTORY — PX: APPLICATION OF WOUND VAC: SHX5189

## 2023-06-29 LAB — CBC
HCT: 24.3 % — ABNORMAL LOW (ref 36.0–46.0)
Hemoglobin: 8.4 g/dL — ABNORMAL LOW (ref 12.0–15.0)
MCH: 30.3 pg (ref 26.0–34.0)
MCHC: 34.6 g/dL (ref 30.0–36.0)
MCV: 87.7 fL (ref 80.0–100.0)
Platelets: 348 10*3/uL (ref 150–400)
RBC: 2.77 MIL/uL — ABNORMAL LOW (ref 3.87–5.11)
RDW: 13.9 % (ref 11.5–15.5)
WBC: 18.3 10*3/uL — ABNORMAL HIGH (ref 4.0–10.5)
nRBC: 0 % (ref 0.0–0.2)

## 2023-06-29 LAB — GLUCOSE, CAPILLARY
Glucose-Capillary: 253 mg/dL — ABNORMAL HIGH (ref 70–99)
Glucose-Capillary: 127 mg/dL — ABNORMAL HIGH (ref 70–99)
Glucose-Capillary: 143 mg/dL — ABNORMAL HIGH (ref 70–99)
Glucose-Capillary: 127 mg/dL — ABNORMAL HIGH (ref 70–99)
Glucose-Capillary: 140 mg/dL — ABNORMAL HIGH (ref 70–99)
Glucose-Capillary: 150 mg/dL — ABNORMAL HIGH (ref 70–99)
Glucose-Capillary: 174 mg/dL — ABNORMAL HIGH (ref 70–99)

## 2023-06-29 LAB — AEROBIC/ANAEROBIC CULTURE W GRAM STAIN (SURGICAL/DEEP WOUND)
Gram Stain: NONE SEEN
Gram Stain: NONE SEEN

## 2023-06-29 LAB — RENAL FUNCTION PANEL
Albumin: 1.8 g/dL — ABNORMAL LOW (ref 3.5–5.0)
Anion gap: 8 (ref 5–15)
BUN: 22 mg/dL (ref 8–23)
CO2: 22 mmol/L (ref 22–32)
Calcium: 8 mg/dL — ABNORMAL LOW (ref 8.9–10.3)
Chloride: 105 mmol/L (ref 98–111)
Creatinine, Ser: 0.77 mg/dL (ref 0.44–1.00)
GFR, Estimated: >60 mL/min (ref >60)
Glucose, Bld: 174 mg/dL — ABNORMAL HIGH (ref 70–99)
Phosphorus: 3.2 mg/dL (ref 2.5–4.6)
Potassium: 4.2 mmol/L (ref 3.5–5.1)
Sodium: 135 mmol/L (ref 135–145)

## 2023-06-29 LAB — HEPARIN LEVEL (UNFRACTIONATED): Heparin Unfractionated: 0.31 [IU]/mL (ref 0.30–0.70)

## 2023-06-29 SURGERY — APPLICATION, WOUND VAC
Anesthesia: General | Laterality: Left

## 2023-06-29 MED ORDER — DEXAMETHASONE SODIUM PHOSPHATE 10 MG/ML IJ SOLN
INTRAMUSCULAR | Status: AC
  Filled 2023-06-29: qty 1

## 2023-06-29 MED ORDER — SODIUM CHLORIDE 0.9 % IV SOLN: INTRAVENOUS | Status: DC

## 2023-06-29 MED ORDER — CHLORHEXIDINE GLUCONATE 4 % EX SOLN: 60.0000 mL | Freq: Once | CUTANEOUS | Status: DC

## 2023-06-29 MED ORDER — FENTANYL CITRATE (PF) 100 MCG/2ML IJ SOLN: 25.0000 ug | INTRAMUSCULAR | Status: DC | PRN

## 2023-06-29 MED ORDER — FENTANYL CITRATE (PF) 100 MCG/2ML IJ SOLN
INTRAMUSCULAR | Status: AC
  Filled 2023-06-29: qty 2

## 2023-06-29 MED ORDER — OXYCODONE HCL 5 MG PO TABS: 5.0000 mg | ORAL_TABLET | Freq: Once | ORAL | Status: DC | PRN

## 2023-06-29 MED ORDER — PROPOFOL 1000 MG/100ML IV EMUL
INTRAVENOUS | Status: AC
  Filled 2023-06-29: qty 100

## 2023-06-29 MED ORDER — 0.9 % SODIUM CHLORIDE (POUR BTL) OPTIME
TOPICAL | Status: DC | PRN
  Administered 2023-06-29: 120 mL

## 2023-06-29 MED ORDER — EPHEDRINE 5 MG/ML INJ
INTRAVENOUS | Status: AC
  Filled 2023-06-29: qty 5

## 2023-06-29 MED ORDER — DIPHENHYDRAMINE HCL 50 MG/ML IJ SOLN: 50.0000 mg | Freq: Once | INTRAMUSCULAR | Status: DC | PRN

## 2023-06-29 MED ORDER — METHYLPREDNISOLONE SODIUM SUCC 125 MG IJ SOLR: 125.0000 mg | Freq: Once | INTRAMUSCULAR | Status: DC | PRN

## 2023-06-29 MED ORDER — FAMOTIDINE 20 MG PO TABS: 40.0000 mg | ORAL_TABLET | Freq: Once | ORAL | Status: DC | PRN

## 2023-06-29 MED ORDER — MIDAZOLAM HCL 2 MG/2ML IJ SOLN
INTRAMUSCULAR | Status: AC
  Filled 2023-06-29: qty 2

## 2023-06-29 MED ORDER — APIXABAN 2.5 MG PO TABS
2.5000 mg | ORAL_TABLET | Freq: Two times a day (BID) | ORAL | Status: DC
  Administered 2023-06-29 – 2023-07-24 (×50): 2.5 mg via ORAL
  Filled 2023-06-29 (×50): qty 1

## 2023-06-29 MED ORDER — PROPOFOL 500 MG/50ML IV EMUL
INTRAVENOUS | Status: DC | PRN
  Administered 2023-06-29: 150 ug/kg/min via INTRAVENOUS
  Administered 2023-06-29: 30 ug/kg/min via INTRAVENOUS

## 2023-06-29 MED ORDER — CEFAZOLIN SODIUM-DEXTROSE 2-4 GM/100ML-% IV SOLN
2.0000 g | INTRAVENOUS | Status: AC
  Administered 2023-06-29: 2 g via INTRAVENOUS

## 2023-06-29 MED ORDER — SUCCINYLCHOLINE CHLORIDE 200 MG/10ML IV SOSY
PREFILLED_SYRINGE | INTRAVENOUS | Status: AC
  Filled 2023-06-29: qty 10

## 2023-06-29 MED ORDER — MIDAZOLAM HCL 2 MG/2ML IJ SOLN
INTRAMUSCULAR | Status: DC | PRN
  Administered 2023-06-29: 2 mg via INTRAVENOUS

## 2023-06-29 MED ORDER — ONDANSETRON HCL 4 MG/2ML IJ SOLN
INTRAMUSCULAR | Status: AC
  Filled 2023-06-29: qty 2

## 2023-06-29 MED ORDER — LIDOCAINE HCL (PF) 2 % IJ SOLN
INTRAMUSCULAR | Status: AC
  Filled 2023-06-29: qty 5

## 2023-06-29 MED ORDER — CEFAZOLIN SODIUM-DEXTROSE 2-4 GM/100ML-% IV SOLN
INTRAVENOUS | Status: AC
  Filled 2023-06-29: qty 100

## 2023-06-29 MED ORDER — FENTANYL CITRATE PF 50 MCG/ML IJ SOSY: 12.5000 ug | PREFILLED_SYRINGE | Freq: Once | INTRAMUSCULAR | Status: DC | PRN

## 2023-06-29 MED ORDER — EPHEDRINE SULFATE-NACL 50-0.9 MG/10ML-% IV SOSY
PREFILLED_SYRINGE | INTRAVENOUS | Status: DC | PRN
  Administered 2023-06-29: 10 mg via INTRAVENOUS

## 2023-06-29 MED ORDER — OXYCODONE HCL 5 MG/5ML PO SOLN: 5.0000 mg | Freq: Once | ORAL | Status: DC | PRN

## 2023-06-29 SURGICAL SUPPLY — 21 items
CANISTER WOUND CARE 500ML ATS (WOUND CARE) IMPLANT
DRAPE EXTREMITY 106X87X128.5 (DRAPES) IMPLANT
DRAPE INCISE IOBAN 66X45 STRL (DRAPES) IMPLANT
DRSG VAC GRANUFOAM MED (GAUZE/BANDAGES/DRESSINGS) IMPLANT
ELECT REM PT RETURN 9FT ADLT (ELECTROSURGICAL) ×1
ELECTRODE REM PT RTRN 9FT ADLT (ELECTROSURGICAL) ×1 IMPLANT
GAUZE SPONGE 4X4 12PLY STRL (GAUZE/BANDAGES/DRESSINGS) IMPLANT
GLOVE BIO SURGEON STRL SZ7 (GLOVE) ×1 IMPLANT
GLOVE SURG SYN 8.0 (GLOVE) ×1 IMPLANT
GLOVE SURG SYN 8.0 PF PI (GLOVE) ×1 IMPLANT
GOWN STRL REUS W/ TWL LRG LVL3 (GOWN DISPOSABLE) ×2 IMPLANT
GOWN STRL REUS W/ TWL XL LVL3 (GOWN DISPOSABLE) ×1 IMPLANT
KIT TURNOVER KIT A (KITS) ×1 IMPLANT
LABEL OR SOLS (LABEL) ×1 IMPLANT
MANIFOLD NEPTUNE II (INSTRUMENTS) ×1 IMPLANT
NS IRRIG 500ML POUR BTL (IV SOLUTION) ×1 IMPLANT
PACK EXTREMITY ARMC (MISCELLANEOUS) ×1 IMPLANT
PAD PREP OB/GYN DISP 24X41 (PERSONAL CARE ITEMS) ×1 IMPLANT
SOL PREP PVP 2OZ (MISCELLANEOUS) ×1
SOLUTION PREP PVP 2OZ (MISCELLANEOUS) ×1 IMPLANT
TRAP FLUID SMOKE EVACUATOR (MISCELLANEOUS) ×1 IMPLANT

## 2023-06-29 NOTE — Plan of Care (Signed)

## 2023-06-29 NOTE — Transfer of Care (Signed)
Immediate Anesthesia Transfer of Care Note  Patient: Laura Reed  Procedure(s) Performed: WOUND VAC EXCHANGE LEFT GROIN (Left)  Patient Location: PACU  Anesthesia Type:General  Level of Consciousness: sedated  Airway & Oxygen Therapy: Patient Spontanous Breathing  Post-op Assessment: Report given to RN and Post -op Vital signs reviewed and stable  Post vital signs: Reviewed and stable  Last Vitals:  Vitals Value Taken Time  BP 90/54 06/29/23 0818  Temp    Pulse 62 06/29/23 0820  Resp 13 06/29/23 0820  SpO2 94 % 06/29/23 0820  Vitals shown include unfiled device data.  Last Pain:  Vitals:   06/29/23 0716  TempSrc: Temporal  PainSc: 0-No pain      Patients Stated Pain Goal: 0 (06/26/23 2115)  Complications: There were no known notable events for this encounter.

## 2023-06-29 NOTE — Progress Notes (Addendum)
Progress Note   Patient: Laura Reed DOB: Nov 08, 1960 DOA: 06/21/2023     8 DOS: the patient was seen and examined on 06/29/2023   Brief hospital course: 62 y.o female with significant PMH of HTN, HLD, DM, sCHF, CAD, PAD (s/p of left common femoral-distal bypass, thrombectomy, angioplasty and stent placement 04/2023 on aspirin, Plavix and Eliquis), anxiety, bladder cancer (s/p of nephrectomy) who presented to the ED with chief complaints of    Patient presented to [facility] urgent care on 06/21/23 with complaints of right groin pain, redness and oozing post op. Patient has hx of PAD and recently underwent left common femoral-distal bypass, thrombectomy, angioplasty and stent placement 04/2023  and right transmetatarsal amputation. States that she has been having left leg swelling. No weeping. Some pitting. She thinks she has been having fever. She was noted to be hypotensive with SBP in the 50s and was sent to the ED for further evaluation.  Patient required vasopressors 12/16.  She went to the OR 12/18 and 200 cc of pus were drained from her left groin.  Her blood pressure stabilized off of pressors and she was transferred to stepdown unit.  Infectious disease was consulted and patient was continued on antibiotics.  Assessment and Plan:  Left groin abscess Sepsis with septic shock resolved Status post I&D with vascular surgery 12/18 with takeback to the OR for left groin debridement, muscle flap, wound VAC exchange 12/20, wound vac exchange 12/23 -continue antimicrobials per ID, currently on Unasyn -Pain control  PAD s/p of left common femoral-tibioperoneal trunk bypass, thrombectomy, angioplasty and stent placement 04/2023  on aspirin, Plavix and Eliquis  -Transitioned to eliquis this AM, remains on aspirin  plavix is being held. Can likely resume if not further plans for OR   Leukocytosis  In the setting of her infection and multiple trips to the OR.  She remains afebrile     Latest Ref Rng & Units 06/29/2023    3:59 AM 06/28/2023    3:39 AM 06/27/2023    4:08 PM  CBC  WBC 4.0 - 10.5 K/uL 18.3  24.7  30.2   Hemoglobin 12.0 - 15.0 g/dL 8.4  9.7  84.6   Hematocrit 36.0 - 46.0 % 24.3  28.1  29.9   Platelets 150 - 400 K/uL 348  422  420    -Continue to monitor     Normocytic anemia    Latest Ref Rng & Units 06/29/2023    3:59 AM 06/28/2023    3:39 AM 06/27/2023    4:08 PM  CBC  WBC 4.0 - 10.5 K/uL 18.3  24.7  30.2   Hemoglobin 12.0 - 15.0 g/dL 8.4  9.7  96.2   Hematocrit 36.0 - 46.0 % 24.3  28.1  29.9   Platelets 150 - 400 K/uL 348  422  420   Unclear exact etiology previously had normal hemoglobin prior to vascular procedures.  This a.m. patient's hemoglobin was noted to be 7.3 from previously stable hemoglobin in the 9.0s.  Repeat hemoglobin 10.2.  Suspect 7.3 was erroneous.  Patient does have some blood tinge output in her wound VAC but does not appear large enough to account for 2 point blood loss.  Patient was given 1 unit of packed red blood cells by vascular team. Ferrtin 86, TSAT 18. Folate WNL. -F/u b12 -Can discharge on iron supplemntation  -Continue to monitor hemoglobin  AKI resolved    Latest Ref Rng & Units 06/29/2023    3:59 AM 06/28/2023  3:40 AM 06/27/2023    1:49 PM  BMP  Glucose 70 - 99 mg/dL 409  79  811   BUN 8 - 23 mg/dL 22  22  24    Creatinine 0.44 - 1.00 mg/dL 9.14  7.82  9.56   Sodium 135 - 145 mmol/L 135  134  134   Potassium 3.5 - 5.1 mmol/L 4.2  3.9  4.7   Chloride 98 - 111 mmol/L 105  107  108   CO2 22 - 32 mmol/L 22  22  19    Calcium 8.9 - 10.3 mg/dL 8.0  8.4  8.6     PVC Patient noted to have frequent PVCs on the monitor in room. She is asymptomatic from these. This is likely causing her heart rate to appear to be lower than normal.  -Will ensure potassium >4, Magnesium>2 -Continue to monitor  Hyponatremia  Resolved Continue to monitor with improving PO intake.   Chronic diastolic HF No evidence of  volume overload   Hypoalbuminemia   HTN  HLD Continue statin Hold home antihypertensives. Mostly normotensive off of midodrine -Continue to monitor blood pressure  T2DM  Poorly controlled  A1c 8 -Continue long-acting to 15 units -Continue  mealtime to 3 units 3 times daily CBG (last 3)  Recent Labs    06/29/23 0926 06/29/23 1127 06/29/23 1727  GLUCAP 127* 140* 150*        Subjective:  Patient without any chest pain or shortness of breath.  She states that she is having a poor appetite.  She feels like she is having reflux symptoms.  Physical Exam: Vitals:   06/29/23 0830 06/29/23 0845 06/29/23 0948 06/29/23 1614  BP: 111/65 126/85 116/62 (!) 120/57  Pulse: 67 67 67 69  Resp: 11 12 16 16   Temp:  98.2 F (36.8 C) 97.6 F (36.4 C) 98.3 F (36.8 C)  TempSrc:      SpO2: 98% 98% (!) 88% 100%  Weight:      Height:         Physical Exam  Constitutional: In no distress.  Cardiovascular: Normal rate, regular rhythm. No lower extremity edema  Pulmonary: Non labored breathing on room air, no wheezing or rales.   Abdominal: Soft. Normal bowel sounds. Non distended and non tender Musculoskeletal: Normal range of motion.    L thigh wound vac in place Neurological: Alert and oriented to person, place, and time. Non focal  Skin: Skin is warm and dry.   Data Reviewed:  I have reviewed all labs and images.     Latest Ref Rng & Units 06/29/2023    3:59 AM 06/28/2023    3:39 AM 06/27/2023    4:08 PM  CBC  WBC 4.0 - 10.5 K/uL 18.3  24.7  30.2   Hemoglobin 12.0 - 15.0 g/dL 8.4  9.7  21.3   Hematocrit 36.0 - 46.0 % 24.3  28.1  29.9   Platelets 150 - 400 K/uL 348  422  420       Latest Ref Rng & Units 06/29/2023    3:59 AM 06/28/2023    3:40 AM 06/27/2023    1:49 PM  BMP  Glucose 70 - 99 mg/dL 086  79  578   BUN 8 - 23 mg/dL 22  22  24    Creatinine 0.44 - 1.00 mg/dL 4.69  6.29  5.28   Sodium 135 - 145 mmol/L 135  134  134   Potassium 3.5 - 5.1 mmol/L 4.2  3.9  4.7   Chloride 98 - 111 mmol/L 105  107  108   CO2 22 - 32 mmol/L 22  22  19    Calcium 8.9 - 10.3 mg/dL 8.0  8.4  8.6     Disposition: Status is: Inpatient Remains inpatient appropriate because: LLE abscess requiring OR washout and frequent trips back to the OR, IV antibiotics   Planned Discharge Destination:  Pending    Heparin infusion  Time spent: 35 minutes  Author: Marolyn Haller, MD 06/29/2023 6:55 PM  For on call review [email or web address].

## 2023-06-29 NOTE — Plan of Care (Signed)
  Problem: Coping: Goal: Ability to adjust to condition or change in health will improve Outcome: Progressing   Problem: Fluid Volume: Goal: Ability to maintain a balanced intake and output will improve Outcome: Progressing   Problem: Health Behavior/Discharge Planning: Goal: Ability to identify and utilize available resources and services will improve Outcome: Progressing   Problem: Metabolic: Goal: Ability to maintain appropriate glucose levels will improve Outcome: Progressing   Problem: Nutritional: Goal: Maintenance of adequate nutrition will improve Outcome: Progressing Goal: Progress toward achieving an optimal weight will improve Outcome: Progressing   Problem: Skin Integrity: Goal: Risk for impaired skin integrity will decrease Outcome: Progressing   Problem: Clinical Measurements: Goal: Ability to maintain clinical measurements within normal limits will improve Outcome: Progressing

## 2023-06-29 NOTE — Op Note (Signed)
    OPERATIVE NOTE   PROCEDURE: 1.  Left groin VAC dressing change  PRE-OPERATIVE DIAGNOSIS: Abscess left groin  POST-OPERATIVE DIAGNOSIS: Same  SURGEON: Earl Lites Lamonte Hartt  ASSISTANT(S): Rolla Plate, NP  ANESTHESIA: MAC  ESTIMATED BLOOD LOSS: 0 cc  FINDING(S): Sartorius muscle flap of appears healthy and is granulating quite well  SPECIMEN(S): None  INDICATIONS:   Laura Reed is a 62 y.o. female who presents with an abscess of the left groin with exposure of the prosthetic bypass.  She has undergone debridement rotation of the sartorius muscle flap.  She is now undergoing routine dressing changes..  DESCRIPTION: After full informed written consent was obtained from the patient, the patient was brought back to the operating room and placed supine upon the operating table.  Prior to induction, the patient received IV antibiotics.   After obtaining adequate anesthesia, the patient was then prepped and draped in the standard fashion for a left groin dressing change.  The existing VAC dressing is removed and the wound is prepped with Betadine.  The wound is then irrigated and inspected.  The sartorius muscle flap appears healthy with good granulation tissue forming over the entire length of the muscle.  The wound is measured and remains 17 cm in length by 4 cm in width and approximately 3 cm in depth.  This is a total of 68 cm.  A medium black VAC sponge is then trimmed and applied to the groin excellent seal was obtained in the operating room.   The patient tolerated this procedure well.   COMPLICATIONS: None  CONDITION: [name] [facility]  Office: [phone]   06/29/2023, 8:30 AM

## 2023-06-29 NOTE — Anesthesia Postprocedure Evaluation (Signed)
Anesthesia Post Note  Patient: TARAJII STIEGLER  Procedure(s) Performed: WOUND VAC EXCHANGE LEFT GROIN (Left)  Patient location during evaluation: PACU Anesthesia Type: General Level of consciousness: awake and alert Pain management: pain level controlled Vital Signs Assessment: post-procedure vital signs reviewed and stable Respiratory status: spontaneous breathing, nonlabored ventilation, respiratory function stable and patient connected to nasal cannula oxygen Cardiovascular status: blood pressure returned to baseline and stable Postop Assessment: no apparent nausea or vomiting Anesthetic complications: no   There were no known notable events for this encounter.   Last Vitals:  Vitals:   06/29/23 0830 06/29/23 0845  BP: 111/65 126/85  Pulse: 67 67  Resp: 11 12  Temp:  36.8 C  SpO2: 98% 98%    Last Pain:  Vitals:   06/29/23 0845  TempSrc:   PainSc: 0-No pain                 Louie Boston

## 2023-06-29 NOTE — Interval H&P Note (Signed)
History and Physical Interval Note:  06/29/2023 7:30 AM  Laura Reed  has presented today for surgery, with the diagnosis of left groin infection.  The various methods of treatment have been discussed with the patient and family. After consideration of risks, benefits and other options for treatment, the patient has consented to  Procedure(s): WOUND VAC EXCHANGE LEFT GROIN (Left) as a surgical intervention.  The patient's history has been reviewed, patient examined, no change in status, stable for surgery.  I have reviewed the patient's chart and labs.  Questions were answered to the patient's satisfaction.     [name]

## 2023-06-29 NOTE — Progress Notes (Signed)
Stop by to check on the patient.  She was sitting in a chair eating her supper.  I discussed the findings at the time of the wound VAC dressing change.  It all looks good and is granulating.  The flap appears viable.  We discussed at length the need for continued physical therapy and strengthening.  She was asking about rehab.  I think this might be a reasonable course of action but asked her to continue to engage with physical therapy so that we can get a better idea of how weak she really is and how much therapy she would need.

## 2023-06-29 NOTE — Progress Notes (Signed)
PHARMACY - ANTICOAGULATION CONSULT NOTE  Pharmacy Consult for heparin infusion Indication: atrial fibrillation  No Known Allergies  Patient Measurements: Height: 5\' 7"  (170.2 cm) Weight: 74.5 kg (164 lb 3.9 oz) IBW/kg (Calculated) : 61.6 Heparin Dosing Weight: 64.1 kg  Vital Signs: Temp: 98.6 F (37 C) (12/22 2314) BP: 126/46 (12/22 2314) Pulse Rate: 62 (12/22 2314)  Labs: Recent Labs    06/27/23 1349 06/27/23 1608 06/27/23 1942 06/28/23 0339 06/28/23 0340 06/29/23 0359  HGB  --  10.2*  --  9.7*  --  8.4*  HCT  --  29.9*  --  28.1*  --  24.3*  PLT  --  420*  --  422*  --  348  HEPARINUNFRC 0.44  --  0.50 0.39  --  0.31  CREATININE 0.79  --   --   --  0.73 0.77    Estimated Creatinine Clearance: 74.9 mL/min (by C-G formula based on SCr of 0.77 mg/dL).   Medical History: Past Medical History:  Diagnosis Date   Absence of kidney    left   Anxiety    Arthritis    Atherosclerosis of native arteries of extremities with intermittent claudication, bilateral legs (HCC)    Bladder cancer (HCC)    CHF (congestive heart failure) (HCC)    Complication of anesthesia    BP HAS  RUN LOW AFTER SURGERY-LUNGS FILLED UP WITH FLUID AFTER  LEG STENT SURGERY    Coronary artery disease    Diabetes mellitus    Family history of adverse reaction to anesthesia    Sister - PONV   GERD (gastroesophageal reflux disease)    OCC TUMS   Heart murmur    Hemorrhoid    History of methicillin resistant staphylococcus aureus (MRSA) 2007   Hypertension    Neuropathy    PVD (peripheral vascular disease) (HCC)    Thyroid nodule    right   Unspecified osteoarthritis, unspecified site    Urothelial carcinoma of kidney (HCC) 10/31/2014   INVASIVE UROTHELIAL CARCINOMA, LOW GRADE. T1, Nx.   Vitamin D deficiency, unspecified    Wears dentures    full upper and lower    Medications:  Scheduled:   aspirin  81 mg Oral QHS   atorvastatin  20 mg Oral Daily   Chlorhexidine Gluconate Cloth  6  each Topical Daily   cholecalciferol  2,000 Units Oral Daily   gabapentin  300 mg Oral BID   insulin aspart  0-15 Units Subcutaneous TID AC & HS   insulin aspart  3 Units Subcutaneous TID WC   insulin glargine-yfgn  15 Units Subcutaneous QHS   multivitamin with minerals  1 tablet Oral Daily   pantoprazole (PROTONIX) IV  40 mg Intravenous Q24H   polyethylene glycol  17 g Oral Daily   senna  1 tablet Oral Daily    Assessment: 62 y.o female with significant PMH of HTN, HLD, DM, sCHF, CAD, PAD (s/p of left common femoral-distal bypass, thrombectomy, angioplasty and stent placement 04/2023 on aspirin, Plavix and Eliquis), anxiety, bladder cancer (s/p of nephrectomy) who presented to the ED with chief complaints of left groin mass. Noted apixaban prior to arrival with last dose documented 06/20/23 pm  Baseline Labs: INR 1.1, aPTT 35s, H&H/PLT stable  Goal of Therapy:  Heparin level 0.3-0.7 units/ml Monitor platelets by anticoagulation protocol: Yes  Heparin Level 12/20@2356  HL = 0.37 Therapeutic x1  12/21@0621  HL = 0.22 SUBtherapeutic   12/21@1349  HL = 0.44 Therapeutic x1  12/21@1942  HL = 0.50  Therapeutic X 2  12/22@0339   HL = 0.39 Therapeutic X 3  12/23@0359  HL = 0.31 Therapeutic X 4     Plan:   12/23 @ 0359 = 0.31, therapeutic X 4 - Will continue pt on current rate and recheck HL on 12/24 with AM labs.  CBC at least once daily while on IV heparin  Arron Mcnaught D, PharmD 06/29/2023 4:38 AM

## 2023-06-29 NOTE — Progress Notes (Signed)
Date of Admission:  06/21/2023      ID: Laura Reed is a 62 y.o. female with   Principal Problem:   Left leg pain Active Problems:   Hypertension   PVD (peripheral vascular disease) (HCC)   Hyperlipidemia associated with type 2 diabetes mellitus (HCC)   CAD (coronary artery disease)   Type II diabetes mellitus with peripheral circulatory disorder (HCC)   AKI (acute kidney injury) (HCC)   Chronic diastolic CHF (congestive heart failure) (HCC)   Hyponatremia   Diarrhea   Leukocytosis   Hypophosphatemia   Normocytic anemia   Abscess of groin, left    Subjective: Some cough No fever  Medications:   apixaban  2.5 mg Oral BID   aspirin  81 mg Oral QHS   atorvastatin  20 mg Oral Daily   Chlorhexidine Gluconate Cloth  6 each Topical Daily   cholecalciferol  2,000 Units Oral Daily   gabapentin  300 mg Oral BID   insulin aspart  0-15 Units Subcutaneous TID AC & HS   insulin aspart  3 Units Subcutaneous TID WC   insulin glargine-yfgn  15 Units Subcutaneous QHS   multivitamin with minerals  1 tablet Oral Daily   pantoprazole (PROTONIX) IV  40 mg Intravenous Q24H   polyethylene glycol  17 g Oral Daily   senna  1 tablet Oral Daily    Objective: Vital signs in last 24 hours: Patient Vitals for the past 24 hrs:  BP Temp Temp src Pulse Resp SpO2  06/29/23 0948 116/62 97.6 F (36.4 C) -- 67 16 (!) 88 %  06/29/23 0845 126/85 98.2 F (36.8 C) -- 67 12 98 %  06/29/23 0830 111/65 -- -- 67 11 98 %  06/29/23 0828 133/62 -- -- 78 14 100 %  06/29/23 0820 -- -- -- -- -- 100 %  06/29/23 0818 (!) 90/54 98.1 F (36.7 C) -- 68 17 93 %  06/29/23 0716 122/69 98 F (36.7 C) Temporal 63 16 99 %  06/28/23 2314 (!) 126/46 98.6 F (37 C) -- 62 18 98 %  06/28/23 1627 (!) 135/46 97.9 F (36.6 C) -- 95 17 98 %      PHYSICAL EXAM:  General: Alert, cooperative, no distress, appears stated age.  Lungs: Clear to auscultation bilaterally. No Wheezing or Rhonchi. No rales. Heart: Regular  rate and rhythm, no murmur, rub or gallop. Abdomen: Soft, non-tender,not distended. Bowel sounds normal. No masses Extremities:left groin wound vac Skin: No rashes or lesions. Or bruising Lymph: Cervical, supraclavicular normal. Neurologic: Grossly non-focal  Lab Results    Latest Ref Rng & Units 06/29/2023    3:59 AM 06/28/2023    3:39 AM 06/27/2023    4:08 PM  CBC  WBC 4.0 - 10.5 K/uL 18.3  24.7  30.2   Hemoglobin 12.0 - 15.0 g/dL 8.4  9.7  56.3   Hematocrit 36.0 - 46.0 % 24.3  28.1  29.9   Platelets 150 - 400 K/uL 348  422  420        Latest Ref Rng & Units 06/29/2023    3:59 AM 06/28/2023    3:40 AM 06/27/2023    1:49 PM  CMP  Glucose 70 - 99 mg/dL 875  79  643   BUN 8 - 23 mg/dL 22  22  24    Creatinine 0.44 - 1.00 mg/dL 3.29  5.18  8.41   Sodium 135 - 145 mmol/L 135  134  134   Potassium 3.5 -  5.1 mmol/L 4.2  3.9  4.7   Chloride 98 - 111 mmol/L 105  107  108   CO2 22 - 32 mmol/L 22  22  19    Calcium 8.9 - 10.3 mg/dL 8.0  8.4  8.6       Microbiology: WC - ecoli-pansensitive Studies/Results: No results found.    Assessment/Plan: PAD s/p left common femoral artery to tibioperoneal trunk artery bypass  with PTFE graft in OCT 2024  Abscess due to Ecoli  at the surgical site- CT showed  10 cm complex collection s/p excisional debridement of the abscess and rotation of a sarorius muscle flap for coverage of existing bypass grafts  Ecoli in culture so far Pan sensitive- on unasyn  Leucocytosis   Left TMA- healed well  ecoli and enterococcus in  culture in sept when she had TMA  CAD s/p CABG   H/o bladder ca s/p left nephrectomy Solitary kidney- avoid nephrotoxic drugs   AKI-  resolved  Discussed the management with the patient

## 2023-06-29 NOTE — Anesthesia Preprocedure Evaluation (Signed)
Anesthesia Evaluation  Patient identified by MRN, date of birth, ID band Patient awake    Reviewed: Allergy & Precautions, NPO status , Patient's Chart, lab work & pertinent test results  History of Anesthesia Complications Negative for: history of anesthetic complications  Airway Mallampati: III  TM Distance: >3 FB Neck ROM: full    Dental no notable dental hx. (+) Edentulous Upper, Edentulous Lower   Pulmonary neg COPD, neg recent URI, former smoker   Pulmonary exam normal breath sounds clear to auscultation       Cardiovascular hypertension, On Medications (-) angina + CAD, + CABG, + Peripheral Vascular Disease and +CHF  (-) Cardiac Stents + dysrhythmias (rate controlled) Atrial Fibrillation + Valvular Problems/Murmurs  Rhythm:Regular Rate:Normal - Systolic murmurs    Neuro/Psych neg Seizures PSYCHIATRIC DISORDERS Anxiety      Neuromuscular disease    GI/Hepatic Neg liver ROS,GERD  Controlled,,  Endo/Other  diabetes, Insulin Dependent    Renal/GU Renal disease     Musculoskeletal  (+) Arthritis ,    Abdominal   Peds  Hematology negative hematology ROS (+)   Anesthesia Other Findings Past Medical History: No date: Absence of kidney     Comment:  left No date: Anxiety No date: Arthritis No date: Bladder cancer (HCC) No date: CHF (congestive heart failure) (HCC) No date: Complication of anesthesia     Comment:  BP HAS  RUN LOW AFTER SURGERY-LUNGS FILLED UP WITH FLUID              AFTER  LEG STENT SURGERY  No date: Coronary artery disease No date: Diabetes mellitus No date: Family history of adverse reaction to anesthesia     Comment:  Sister - PONV No date: GERD (gastroesophageal reflux disease)     Comment:  OCC TUMS No date: Heart murmur No date: Hemorrhoid 2007: History of methicillin resistant staphylococcus aureus (MRSA) No date: Hypertension No date: Neuropathy No date: PVD (peripheral vascular  disease) (HCC) No date: Thyroid nodule     Comment:  right 10/31/2014: Urothelial carcinoma of kidney (HCC)     Comment:  INVASIVE UROTHELIAL CARCINOMA, LOW GRADE. T1, Nx. No date: Wears dentures     Comment:  full upper and lower  Past Surgical History: No date: AMPUTATION TOE     Comment:  right (4th and 5th); left (great toe, 3rd) 07/16/2018: AMPUTATION TOE; Right     Comment:  Procedure: AMPUTATION TOE/MPJ right 2nd;  Surgeon:               Linus Galas, DPM;  Location: [facility];  Service:               Podiatry;  Laterality: Right; 2009, 2013 x 2: ARTERIAL BYPASS SURGRY     Comment:  right leg , done in [place] No date: CARDIAC CATHETERIZATION 01/2014: CAROTID ENDARTERECTOMY; Right     Comment:  Dr Gilda Crease 12/14/2014: CATARACT EXTRACTION W/PHACO; Right     Comment:  Procedure: CATARACT EXTRACTION PHACO AND INTRAOCULAR               LENS PLACEMENT (IOC);  Surgeon: Lia Hopping, MD;                Location: [facility];  Service: Ophthalmology;  Laterality:              Right;  [place]   00:38.6              AP        7.1  CDE  2.76 12/06/2019: CATARACT EXTRACTION W/PHACO; Left     Comment:  Procedure: CATARACT EXTRACTION PHACO AND INTRAOCULAR               LENS PLACEMENT (IOC) LEFT DIABETIC;  Surgeon: Galen Manila, MD;  Location: [facility];  Service:               Ophthalmology;  Laterality: Left;  9.08 1:06.4 No date: CESAREAN SECTION 03-03-12: CHOLECYSTECTOMY     Comment:  Porcelain gallbladder, gallstones,  [place] 04/28/2012: COLONOSCOPY W/ BIOPSIES     Comment:  Hyperplastic rectal polyps. 04/02/2022: COLONOSCOPY WITH PROPOFOL; N/A     Comment:  Procedure: COLONOSCOPY WITH PROPOFOL;  Surgeon: Earline Mayotte, MD;  Location: [facility];  Service:               Endoscopy;  Laterality: N/A; 2009: CORONARY ARTERY BYPASS GRAFT     Comment:  3 vessel 09/01/2016: CYSTOSCOPY W/ RETROGRADES; Right     Comment:   Procedure: CYSTOSCOPY WITH RETROGRADE PYELOGRAM;                Surgeon: Vanna Scotland, MD;  Location: [facility];                Service: Urology;  Laterality: Right; 03/19/2020: CYSTOSCOPY W/ RETROGRADES; Bilateral     Comment:  Procedure: CYSTOSCOPY WITH RETROGRADE PYELOGRAM;                Surgeon: Vanna Scotland, MD;  Location: [facility];                Service: Urology;  Laterality: Bilateral; 03/19/2020: CYSTOSCOPY WITH BIOPSY; N/A     Comment:  Procedure: CYSTOSCOPY WITH BIOPSY;  Surgeon: Vanna Scotland, MD;  Location: [facility];  Service: Urology;                Laterality: N/A; No date: EYE SURGERY 10-31-14: HERNIA REPAIR     Comment:  ventral, retro-rectus atrium mesh 01/18/2019: IRRIGATION AND DEBRIDEMENT FOOT; Left     Comment:  Procedure: IRRIGATION AND DEBRIDEMENT FOOT;  Surgeon:               Linus Galas, DPM;  Location: [facility];  Service:               Podiatry;  Laterality: Left; 12/10/2016: LOWER EXTREMITY ANGIOGRAPHY; Left     Comment:  Procedure: Lower Extremity Angiography;  Surgeon:               Renford Dills, MD;  Location: [facility];               Service: Cardiovascular;  Laterality: Left; 02/02/2018: LOWER EXTREMITY ANGIOGRAPHY; Left     Comment:  Procedure: LOWER EXTREMITY ANGIOGRAPHY;  Surgeon:               Renford Dills, MD;  Location: [facility];               Service: Cardiovascular;  Laterality: Left; 05/05/2018: LOWER EXTREMITY ANGIOGRAPHY; Left     Comment:  Procedure: LOWER EXTREMITY ANGIOGRAPHY;  Surgeon:               Renford Dills, MD;  Location: [facility] IN[facility]  CV [facility];               Service: Cardiovascular;  Laterality: Left; 12/04/2020: LOWER EXTREMITY ANGIOGRAPHY; Left     Comment:  Procedure: LOWER EXTREMITY ANGIOGRAPHY with               Intervention;  Surgeon: Renford Dills, MD;                Location: [facility];  Service: Cardiovascular;              Laterality:  Left; 04/24/2023: LOWER EXTREMITY ANGIOGRAPHY; Left     Comment:  Procedure: Lower Extremity Angiography;  Surgeon:               Renford Dills, MD;  Location: [facility];               Service: Cardiovascular;  Laterality: Left; 10-31-14: NEPHRECTOMY; Left 05/01/2015: PERIPHERAL VASCULAR CATHETERIZATION; Left     Comment:  Procedure: Lower Extremity Angiography;  Surgeon:               Renford Dills, MD;  Location: [facility];                Service: Cardiovascular;  Laterality: Left; 05/01/2015: PERIPHERAL VASCULAR CATHETERIZATION     Comment:  Procedure: Lower Extremity Intervention;  Surgeon:               Renford Dills, MD;  Location: [facility];                Service: Cardiovascular;; 02/20/2015: PERIPHERAL VASCULAR CATHETERIZATION; Left     Comment:  Procedure: Pelvic Angiography;  Surgeon: Renford Dills, MD;  Location: [facility];  Service:               Cardiovascular;  Laterality: Left; 09/01/2016: TRANSURETHRAL RESECTION OF BLADDER TUMOR WITH MITOMYCIN-C;  N/A     Comment:  Procedure: TRANSURETHRAL RESECTION OF BLADDER TUMOR WITH              MITOMYCIN-C;  Surgeon: Vanna Scotland, MD;  Location:               [facility];  Service: Urology;  Laterality: N/A;  BMI    Body Mass Index: 21.13 kg/m      Reproductive/Obstetrics negative OB ROS                              Anesthesia Physical Anesthesia Plan  ASA: 3  Anesthesia Plan: General   Post-op Pain Management: Minimal or no pain anticipated, Gabapentin PO (pre-op)* and Ofirmev IV (intra-op)*   Induction: Intravenous  PONV Risk Score and Plan: 2 and Propofol infusion, TIVA, Ondansetron, Midazolam and Dexamethasone  Airway Management Planned: Nasal Cannula and Natural Airway  Additional Equipment: None  Intra-op Plan:   Post-operative Plan:   Informed Consent: I have reviewed the patients History and Physical,  chart, labs and discussed the procedure including the risks, benefits and alternatives for the proposed anesthesia with the patient or authorized representative who has indicated his/her understanding and acceptance.     Dental advisory given  Plan Discussed with: CRNA and Surgeon  Anesthesia Plan Comments: (Discussed risks of anesthesia with patient, including possibility of difficulty with spontaneous ventilation under anesthesia necessitating airway intervention, PONV, and rare  risks such as cardiac or respiratory or neurological events, and allergic reactions. Discussed the role of CRNA in patient's perioperative care. Patient understands.)         Anesthesia Quick Evaluation

## 2023-06-29 NOTE — Progress Notes (Signed)
PT Cancellation Note  Patient Details Name: Laura Reed MRN: [id number] DOB: [birth year]   Cancelled Treatment:    Reason Eval/Treat Not Completed: Patient at procedure or test/unavailable.  Pt currently off floor for procedure.  Will re-attempt PT session at a later date/time.  Hendricks Limes, PT 06/29/23, 8:34 AM

## 2023-06-30 ENCOUNTER — Encounter: Payer: Self-pay | Admitting: Vascular Surgery

## 2023-06-30 DIAGNOSIS — I739 Peripheral vascular disease, unspecified: Secondary | ICD-10-CM | POA: Diagnosis not present

## 2023-06-30 DIAGNOSIS — D72829 Elevated white blood cell count, unspecified: Secondary | ICD-10-CM | POA: Diagnosis not present

## 2023-06-30 DIAGNOSIS — T827XXA Infection and inflammatory reaction due to other cardiac and vascular devices, implants and grafts, initial encounter: Secondary | ICD-10-CM | POA: Diagnosis not present

## 2023-06-30 DIAGNOSIS — M79605 Pain in left leg: Secondary | ICD-10-CM | POA: Diagnosis not present

## 2023-06-30 DIAGNOSIS — L02214 Cutaneous abscess of groin: Secondary | ICD-10-CM | POA: Diagnosis not present

## 2023-06-30 LAB — BASIC METABOLIC PANEL
Anion gap: 7 (ref 5–15)
BUN: 19 mg/dL (ref 8–23)
CO2: 24 mmol/L (ref 22–32)
Calcium: 7.9 mg/dL — ABNORMAL LOW (ref 8.9–10.3)
Chloride: 103 mmol/L (ref 98–111)
Creatinine, Ser: 0.73 mg/dL (ref 0.44–1.00)
GFR, Estimated: >60 mL/min (ref >60)
Glucose, Bld: 141 mg/dL — ABNORMAL HIGH (ref 70–99)
Potassium: 4 mmol/L (ref 3.5–5.1)
Sodium: 134 mmol/L — ABNORMAL LOW (ref 135–145)

## 2023-06-30 LAB — GLUCOSE, CAPILLARY
Glucose-Capillary: 155 mg/dL — ABNORMAL HIGH (ref 70–99)
Glucose-Capillary: 137 mg/dL — ABNORMAL HIGH (ref 70–99)
Glucose-Capillary: 105 mg/dL — ABNORMAL HIGH (ref 70–99)
Glucose-Capillary: 94 mg/dL (ref 70–99)

## 2023-06-30 LAB — CBC
HCT: 24.7 % — ABNORMAL LOW (ref 36.0–46.0)
Hemoglobin: 8.5 g/dL — ABNORMAL LOW (ref 12.0–15.0)
MCH: 29.9 pg (ref 26.0–34.0)
MCHC: 34.4 g/dL (ref 30.0–36.0)
MCV: 87 fL (ref 80.0–100.0)
Platelets: 383 10*3/uL (ref 150–400)
RBC: 2.84 MIL/uL — ABNORMAL LOW (ref 3.87–5.11)
RDW: 14.2 % (ref 11.5–15.5)
WBC: 15.2 10*3/uL — ABNORMAL HIGH (ref 4.0–10.5)
nRBC: 0 % (ref 0.0–0.2)

## 2023-06-30 LAB — VITAMIN B12: Vitamin B-12: 550 pg/mL (ref 180–914)

## 2023-06-30 MED ORDER — OXYCODONE-ACETAMINOPHEN 5-325 MG PO TABS
1.0000 | ORAL_TABLET | ORAL | Status: DC | PRN
  Administered 2023-06-30 – 2023-07-23 (×31): 1 via ORAL
  Filled 2023-06-30 (×31): qty 1

## 2023-06-30 MED ORDER — FENTANYL CITRATE PF 50 MCG/ML IJ SOSY: 12.5000 ug | PREFILLED_SYRINGE | Freq: Once | INTRAMUSCULAR | Status: DC | PRN

## 2023-06-30 NOTE — Plan of Care (Signed)

## 2023-06-30 NOTE — Care Management Important Message (Signed)
Important Message  Patient Details  Name: Laura Reed MRN: [id number] Date of Birth: [birth year]   Important Message Given:  Yes - Medicare IM     Sherilyn Banker 06/30/2023, 11:27 AM

## 2023-06-30 NOTE — H&P (View-Only) (Signed)
Progress Note    06/30/2023 10:17 AM 1 Day Post-Op  Subjective: Laura Reed is a 62 year old female now status postop day 1 from wound VAC washout and change in the operating room.  Wound VAC working well with noted 30 cc of serous drainage to the canister.  No complaints overnight.  Patient continues to work with physical therapy.  Vitals are remained stable.   Vitals:   06/29/23 2308 06/30/23 0848  BP: (!) 121/46 (!) 110/58  Pulse: 72 71  Resp: 17 16  Temp: 99.1 F (37.3 C) 98.5 F (36.9 C)  SpO2: 94% 100%   Physical Exam: Cardiac:  RRR, Normal S1,S2. No murmurs appreciated.  Bradycardia resolved. Lungs: Clear on auscultation throughout but remains diminished in the bases.  Nonlabored breathing.  Nonproductive cough this morning.  Instructed to use bedside incentive spirometry. Incisions: Left groin with wound VAC in place.  Working well. Extremities: Bilateral lower extremities warm to touch.  Left lower extremity with +2 edema.  Able to palpate pulses but has positive Doppler pulses. Abdomen: Positive bowel sounds throughout, soft, nontender nondistended. Neurologic: Alert and oriented x 3, answers all questions and follows commands appropriately.  CBC    Component Value Date/Time   WBC 15.2 (H) 06/30/2023 0546   RBC 2.84 (L) 06/30/2023 0546   HGB 8.5 (L) 06/30/2023 0546   HGB 9.5 (L) 11/02/2014 0609   HCT 24.7 (L) 06/30/2023 0546   HCT 29.5 (L) 11/02/2014 0609   PLT 383 06/30/2023 0546   PLT 217 11/02/2014 0609   MCV 87.0 06/30/2023 0546   MCV 87 11/02/2014 0609   MCH 29.9 06/30/2023 0546   MCHC 34.4 06/30/2023 0546   RDW 14.2 06/30/2023 0546   RDW 13.6 11/02/2014 0609   LYMPHSABS 1.3 09/08/2022 1044   LYMPHSABS 1.2 11/02/2014 0609   MONOABS 0.7 09/08/2022 1044   MONOABS 1.1 (H) 11/02/2014 0609   EOSABS 0.1 09/08/2022 1044   EOSABS 0.3 11/02/2014 0609   BASOSABS 0.1 09/08/2022 1044   BASOSABS 0.0 11/02/2014 0609    BMET    Component Value Date/Time    NA 134 (L) 06/30/2023 0546   NA 136 08/02/2021 1629   NA 135 11/02/2014 0609   K 4.0 06/30/2023 0546   K 4.2 11/02/2014 0609   CL 103 06/30/2023 0546   CL 107 11/02/2014 0609   CO2 24 06/30/2023 0546   CO2 23 11/02/2014 0609   GLUCOSE 141 (H) 06/30/2023 0546   GLUCOSE 108 (H) 11/02/2014 0609   BUN 19 06/30/2023 0546   BUN 25 08/02/2021 1629   BUN 20 11/02/2014 0609   CREATININE 0.73 06/30/2023 0546   CREATININE 1.01 11/09/2015 1549   CREATININE 1.01 11/09/2015 1549   CALCIUM 7.9 (L) 06/30/2023 0546   CALCIUM 7.3 (L) 11/02/2014 0609   GFRNONAA >60 06/30/2023 0546   GFRNONAA 50 (L) 11/02/2014 0609   GFRAA >60 01/19/2019 0357   GFRAA 58 (L) 11/02/2014 0609    INR    Component Value Date/Time   INR 1.1 06/21/2023 2304   INR 0.9 10/17/2014 1131     Intake/Output Summary (Last 24 hours) at 06/30/2023 1017 Last data filed at 06/30/2023 0300 Gross per 24 hour  Intake 200 ml  Output --  Net 200 ml     Assessment/Plan:  62 y.o. female is s/p wound VAC washout with wound VAC change.  1 Day Post-Op   Plan Vascular surgery plans on taking the patient back to the operating room on 07/02/2023 for another wound  VAC change out and assessment of the satoreotide flap in her groin.  Patient was reminded should be made n.p.o. after midnight on 07/02/23.  She verbalized her understanding.  Wishes to proceed.  Dr. [name] MD is made aware of the plan and he agrees with plan.  DVT prophylaxis: Eliquis 2.5 twice daily, aspirin 81 mg daily, and Lipitor 20 daily   Marcie Bal [facility] 06/30/2023 10:17 AM

## 2023-06-30 NOTE — Progress Notes (Signed)
Physical Therapy Treatment Patient Details Name: Laura Reed MRN: [id number] DOB: 1960-11-21 Today's Date: 06/30/2023   History of Present Illness Laura Reed  has presented today for surgery, with the diagnosis of Groin Infection. S/P I & D left groin. On April 29, 2023 she underwent a femoral to tibioperoneal trunk bypass using a PTFE distal flow graft.  She was noted to thrombose within the first 48 hours and subsequently underwent angiography with salvage of her bypass and stenting of the anterior tibial artery.  Following the second procedure which was May 01, 2023 she did well and was discharged to home approximately 10 days later. Wound vac change 12/23.    PT Comments  Pt up in chair, sister at bedside. Pain at goal. Author assisted with shoe donning and carries NPT unit in session. Pt motivated to progress mobility and strength. Multiple transfers in session and progression in walking distance, all with minGuardA or less, no LOB- safe use of RW noted. Pt partakes in gait based balance activity after AMB. Discussed progressing pt to AMB in room and/or hall with assistance from sister when IV does not pose a challenge. Pt and sister are committed to advancing pt mobility, confidence, and ease to best prepare for DC to home rather than additional rehab. Will continue to follow.    If plan is discharge home, recommend the following: A little help with walking and/or transfers;A little help with bathing/dressing/bathroom;Assistance with cooking/housework;Help with stairs or ramp for entrance;Assist for transportation   Can travel by private vehicle        Equipment Recommendations  Rolling walker (2 wheels)    Recommendations for Other Services       Precautions / Restrictions Precautions Precautions: Fall Precaution Comments: wound vac Restrictions Weight Bearing Restrictions Per Provider Order: No     Mobility  Bed Mobility               General bed mobility  comments: in chair before and after    Transfers Overall transfer level: Needs assistance Equipment used: Rolling walker (2 wheels) Transfers: Sit to/from Stand Sit to Stand: Contact guard assist                Ambulation/Gait Ambulation/Gait assistance: Contact guard assist Gait Distance (Feet): 160 Feet Assistive device: Rolling walker (2 wheels) Gait Pattern/deviations: Step-to pattern Gait velocity: 0.70m/s     General Gait Details: generally slow but steady gait   Stairs             Wheelchair Mobility     Tilt Bed    Modified Rankin (Stroke Patients Only)       Balance                                            Cognition Arousal: Alert Behavior During Therapy: WFL for tasks assessed/performed Overall Cognitive Status: Within Functional Limits for tasks assessed                                          Exercises Other Exercises Other Exercises: side stepping along elevated bed rail x16ft bilat    General Comments        Pertinent Vitals/Pain Pain Assessment Pain Assessment: 0-10 Pain Score: 2  Pain Location: L groin Pain Intervention(s):  Limited activity within patient's tolerance, Monitored during session, Premedicated before session    Home Living                          Prior Function            PT Goals (current goals can now be found in the care plan section) Acute Rehab PT Goals Patient Stated Goal: to return home, states she will be here a while, will need additional procedures PT Goal Formulation: With patient Time For Goal Achievement: 07/15/23 Potential to Achieve Goals: Good Progress towards PT goals: Progressing toward goals    Frequency    Min 1X/week      PT Plan      Co-evaluation              AM-PAC PT "6 Clicks" Mobility   Outcome Measure  Help needed turning from your back to your side while in a flat bed without using bedrails?: A  Little Help needed moving from lying on your back to sitting on the side of a flat bed without using bedrails?: A Little Help needed moving to and from a bed to a chair (including a wheelchair)?: A Little Help needed standing up from a chair using your arms (e.g., wheelchair or bedside chair)?: A Little Help needed to walk in hospital room?: A Little Help needed climbing 3-5 steps with a railing? : A Little 6 Click Score: 18    End of Session Equipment Utilized During Treatment: Gait belt Activity Tolerance: Patient tolerated treatment well;No increased pain;Patient limited by fatigue Patient left: in chair;with call bell/phone within reach;with family/visitor present Nurse Communication: Mobility status PT Visit Diagnosis: Other abnormalities of gait and mobility (R26.89);Unsteadiness on feet (R26.81);Pain;Difficulty in walking, not elsewhere classified (R26.2);Muscle weakness (generalized) (M62.81) Pain - Right/Left: Left Pain - part of body: Leg     Time: 1446-1510 PT Time Calculation (min) (ACUTE ONLY): 24 min  Charges:    $Therapeutic Activity: 23-37 mins PT General Charges $$ ACUTE PT VISIT: 1 Visit                    5:30 PM, 06/30/23 Rosamaria Lints, PT, DPT Physical Therapist - [facility]  [phone] (ASCOM)    Laura Reed 06/30/2023, 5:28 PM

## 2023-06-30 NOTE — Progress Notes (Signed)
Occupational Therapy Treatment Patient Details Name: Laura Reed MRN: [id number] DOB: 01-05-1961 Today's Date: 06/30/2023   History of present illness Laura Reed  has presented today for surgery, with the diagnosis of Groin Infection. S/P I & D left groin. On April 29, 2023 she underwent a femoral to tibioperoneal trunk bypass using a PTFE distal flow graft.  She was noted to thrombose within the first 48 hours and subsequently underwent angiography with salvage of her bypass and stenting of the anterior tibial artery.  Following the second procedure which was May 01, 2023 she did well and was discharged to home approximately 10 days later. Wound vac change 12/23.   OT comments  Pt making good progress towards goals, educated pt + sister on using AE in home environment to increase safety and prevent falls (use of Alexa for drop ins, emergency feature setup). CGA/Supervision level for functional transfers this date. See flowsheet for additional details. Pt seen for OT treatment on this date. Will continue to follow POC. Discharge recommendation remains appropriate.        If plan is discharge home, recommend the following:  A little help with walking and/or transfers;A little help with bathing/dressing/bathroom;Assistance with cooking/housework;Assist for transportation;Help with stairs or ramp for entrance   Equipment Recommendations  Tub/shower seat;Other (comment)    Recommendations for Other Services Other (comment)    Precautions / Restrictions Precautions Precautions: Fall Precaution Comments: wound vac Restrictions Weight Bearing Restrictions Per Provider Order: No       Mobility Bed Mobility               General bed mobility comments: in chair before and after    Transfers Overall transfer level: Needs assistance Equipment used: Rolling walker (2 wheels) Transfers: Sit to/from Stand Sit to Stand: Contact guard assist, Supervision                  Balance Overall balance assessment: Needs assistance Sitting-balance support: Feet supported Sitting balance-Leahy Scale: Good     Standing balance support: Bilateral upper extremity supported, During functional activity, Reliant on assistive device for balance Standing balance-Leahy Scale: Fair                             ADL either performed or assessed with clinical judgement   ADL Overall ADL's : Needs assistance/impaired                         Toilet Transfer: BSC/3in1;Ambulation;Rolling walker (2 wheels);Cueing for safety;Supervision/safety;Contact guard assist   Toileting- Clothing Manipulation and Hygiene: Sit to/from stand;Contact guard assist;Cueing for safety       Functional mobility during ADLs: Supervision/safety;Rolling walker (2 wheels) General ADL Comments: CGA-supervision for toilet transfers with RW, OT managing lines      Cognition Arousal: Alert Behavior During Therapy: WFL for tasks assessed/performed Overall Cognitive Status: Within Functional Limits for tasks assessed                                                     Pertinent Vitals/ Pain       Pain Assessment Pain Assessment: No/denies pain Pain Score: 0-No pain   Frequency  Min 1X/week        Progress Toward Goals  OT Goals(current goals  can now be found in the care plan section)  Progress towards OT goals: Progressing toward goals  Acute Rehab OT Goals OT Goal Formulation: With patient/family Time For Goal Achievement: 07/12/23 Potential to Achieve Goals: Good ADL Goals Additional ADL Goal #1: Pt will verbalize plan to implement at least 2 learned ECS to support safety with ADL/mobility. Additional ADL Goal #2: Pt will complete all aspects of a shower primarily from seated position wiht modified independence, incorporating learned ECS without VC, 1/1 opportunity.  Plan         AM-PAC OT "6 Clicks" Daily Activity     Outcome  Measure   Help from another person eating meals?: None Help from another person taking care of personal grooming?: None Help from another person toileting, which includes using toliet, bedpan, or urinal?: A Little Help from another person bathing (including washing, rinsing, drying)?: A Little Help from another person to put on and taking off regular upper body clothing?: None Help from another person to put on and taking off regular lower body clothing?: A Little 6 Click Score: 21    End of Session Equipment Utilized During Treatment: Gait belt;Rolling walker (2 wheels)  OT Visit Diagnosis: Other abnormalities of gait and mobility (R26.89)   Activity Tolerance Patient tolerated treatment well   Patient Left in chair;with call bell/phone within reach;with chair alarm set;with family/visitor present   Nurse Communication Mobility status        Time: 2993-7169 OT Time Calculation (min): 28 min  Charges: OT General Charges $OT Visit: 1 Visit OT Treatments $Self Care/Home Management : 23-37 mins  Aubrianne Molyneux L. Lakysha Kossman, OTR/L  06/30/23, 4:39 PM

## 2023-06-30 NOTE — Progress Notes (Signed)
PROGRESS NOTE    Laura Reed   ZOX:[id number] DOB: 12/09/1960  DOA: 06/21/2023 Date of Service: 06/30/23 which is hospital day 9  PCP: Sherlene Shams, MD    HPI: 62 y.o female with significant PMH of HTN, HLD, DM, sCHF, CAD, PAD (s/p of left common femoral-distal bypass, thrombectomy, angioplasty and stent placement 04/2023 on aspirin, Plavix and Eliquis), anxiety, bladder cancer (s/p of nephrectomy). [facility] on 06/21/23 with complaints of right groin pain, redness and oozing post op. Patient has hx of PAD and recently underwent left common femoral-distal bypass, thrombectomy, angioplasty and stent placement 04/2023 and right transmetatarsal amputation. SBP in the 50s and was sent to the ED for further evaluation.   Hospital course / significant events:  12/16: admitted to hospitalist service later transferred to ICU for pressor support.  12/17: in ICU, more stable  12/18: to OR, 200 cc pus drained from L groin. ID consulted.  12/19: remains stable off pressors, optimized for transfer back to hospitalist service  12/20: transfer back to [facility]. To OR, left groin debridement, muscle flap, wound VAC exchange  12/23: wound vac exchange. Per vascular surgery, wound appears appropriate and flap appears viable. On Eliquis, ASA. Still holding Plavix and can probably resume once confirm no further plans for OR. Question rehab vs home, continue working w/ PT/OT  12/24: planning repeat wound vac change 12/16, stable at this time      Consultants:  [facility] Vascular Surgery  Infectious disease   Procedures/Surgeries: 06/24/23: Excisional debridement of left groin abscess. Rotation of a sartorius muscle flap for coverage of the existing bypass grafts. Placement of a nondisposable wound VAC dressing left groin. Redo vascular surgery - Drs. [name] and Dew 06/26/23: Excisional debridement left groin wound. Placement of a nondisposable VAC dressing - Dr Gilda Crease 06/29/23: Left groin VAC dressing  change - Dr Gilda Crease         ASSESSMENT & PLAN:   Left groin abscess Sepsis with septic shock resolved Status post I&D with vascular surgery 12/18 with takeback to the OR for left groin debridement, muscle flap, wound VAC exchange 12/20, wound vac exchange 12/23 continue antimicrobials per ID, currently on Unasyn Pain control Vascular following    PAD s/p of left common femoral-tibioperoneal trunk bypass, thrombectomy, angioplasty and stent placement 04/2023  on aspirin, Plavix and Eliquis  Continue eliquis and aspirin Continue statin plavix is being held. Can likely resume once no further plans for OR    Leukocytosis  In the setting of her infection and multiple trips to the OR.   Monitor CBC Per ID consider transition to po abx once WBC improving    Normocytic anemia Expected ABLA d/t surgeries normal hemoglobin prior to vascular procedures.   some blood tinge output in her wound VAC S/p 1 unit of packed red blood cells by vascular team.  Can discharge on iron supplemntation  Continue to monitor CBC   AKI  Likely d/t septic shock resolved Monitor BMP   PVC Patient noted to have frequent PVCs on tele This is likely causing her heart rate to appear to be lower than normal.  Will ensure potassium >4, Magnesium>2 Continue to monitor   Hyponatremia  Resolved monitor with improving PO intake.    Chronic diastolic HF Edema distal to LLE wound Monitor fluid status  Diuresis prn, pt would like to hold off on "fluid pills" for now    Hypoalbuminemia  Encourage po intake   HTN  HLD Continue statin Hold home antihypertensives.  Mostly normotensive off of midodrine Continue to monitor blood pressure   T2DM  Poorly controlled  A1c 8 Continue long-acting to 15 units Continue  mealtime to 3 units 3 times daily      DVT prophylaxis: ELiquis  IV fluids: no continuous IV fluids  Nutrition: carb modified  Central lines / invasive devices: wound vac   Code  Status: FULL CODE ACP documentation reviewed:  HCPOA and living will are on file in [facility]  TOC needs: TBD Barriers to dispo / significant pending items: wound vac in place, iv abx, plannign wound vac change in OR in 2 days              Subjective / Brief ROS:  Patient reports doing well today, was able to get up and walk some but is feeling tired after that  Denies CP/SOB.  Pain controlled.  Denies new weakness.  Tolerating diet.  Reports no concerns w/ urination/defecation.   Family Communication: sister at bedside on rounds     Objective Findings:  Vitals:   06/29/23 1614 06/29/23 2308 06/30/23 0500 06/30/23 0848  BP: (!) 120/57 (!) 121/46  (!) 110/58  Pulse: 69 72  71  Resp: 16 17  16   Temp: 98.3 F (36.8 C) 99.1 F (37.3 C)  98.5 F (36.9 C)  TempSrc:      SpO2: 100% 94%  100%  Weight:   73.2 kg   Height:        Intake/Output Summary (Last 24 hours) at 06/30/2023 1403 Last data filed at 06/30/2023 1042 Gross per 24 hour  Intake 440 ml  Output --  Net 440 ml   Filed Weights   06/26/23 0500 06/26/23 1036 06/30/23 0500  Weight: 74.5 kg 74.5 kg 73.2 kg    Examination:  Physical Exam Constitutional:      General: She is not in acute distress. Cardiovascular:     Rate and Rhythm: Normal rate and regular rhythm.  Pulmonary:     Effort: Pulmonary effort is normal.     Breath sounds: Normal breath sounds.  Musculoskeletal:     Left lower leg: Edema present.  Neurological:     General: No focal deficit present.     Mental Status: She is alert and oriented to person, place, and time. Mental status is at baseline.  Psychiatric:        Mood and Affect: Mood normal.        Behavior: Behavior normal.          Scheduled Medications:   apixaban  2.5 mg Oral BID   aspirin  81 mg Oral QHS   atorvastatin  20 mg Oral Daily   Chlorhexidine Gluconate Cloth  6 each Topical Daily   cholecalciferol  2,000 Units Oral Daily   gabapentin  300 mg Oral  BID   insulin aspart  0-15 Units Subcutaneous TID AC & HS   insulin aspart  3 Units Subcutaneous TID WC   insulin glargine-yfgn  15 Units Subcutaneous QHS   multivitamin with minerals  1 tablet Oral Daily   pantoprazole (PROTONIX) IV  40 mg Intravenous Q24H   polyethylene glycol  17 g Oral Daily   senna  1 tablet Oral Daily    Continuous Infusions:  ampicillin-sulbactam (UNASYN) IV 3 g (06/30/23 0920)    PRN Medications:  acetaminophen, ALPRAZolam, alum & mag hydroxide-simeth, fentaNYL (SUBLIMAZE) injection, ondansetron (ZOFRAN) IV, oxyCODONE-acetaminophen  Antimicrobials from admission:  Anti-infectives (From admission, onward)    Start  Dose/Rate Route Frequency Ordered Stop   06/29/23 0620  ceFAZolin (ANCEF) IVPB 2g/100 mL premix        2 g 200 mL/hr over 30 Minutes Intravenous 30 min pre-op 06/29/23 0620 06/29/23 0747   06/26/23 1800  Ampicillin-Sulbactam (UNASYN) 3 g in sodium chloride 0.9 % 100 mL IVPB        3 g 200 mL/hr over 30 Minutes Intravenous Every 6 hours 06/26/23 1646     06/24/23 1322  gentamicin (GARAMYCIN) injection  Status:  Discontinued          As needed 06/24/23 1322 06/24/23 1347   06/24/23 1303  tobramycin (NEBCIN) powder  Status:  Discontinued          As needed 06/24/23 1303 06/24/23 1347   06/24/23 1303  vancomycin (VANCOCIN) powder  Status:  Discontinued          As needed 06/24/23 1304 06/24/23 1347   06/24/23 1200  vancomycin (VANCOREADY) IVPB 1250 mg/250 mL  Status:  Discontinued       Placed in "Followed by" Linked Group   1,250 mg 166.7 mL/hr over 90 Minutes Intravenous Every 24 hours 06/23/23 1244 06/23/23 1651   06/24/23 1000  DAPTOmycin (CUBICIN) IVPB 500 mg/43mL premix  Status:  Discontinued        8 mg/kg  64.1 kg 100 mL/hr over 30 Minutes Intravenous Daily 06/23/23 1651 06/26/23 1646   06/24/23 0234  ceFAZolin (ANCEF) IVPB 2g/100 mL premix        2 g 200 mL/hr over 30 Minutes Intravenous 30 min pre-op 06/24/23 0234 06/24/23 1300    06/23/23 1800  ceFEPIme (MAXIPIME) 2 g in sodium chloride 0.9 % 100 mL IVPB  Status:  Discontinued        2 g 200 mL/hr over 30 Minutes Intravenous Every 8 hours 06/23/23 1242 06/26/23 1646   06/23/23 1330  vancomycin (VANCOREADY) IVPB 1250 mg/250 mL  Status:  Discontinued       Placed in "Followed by" Linked Group   1,250 mg 166.7 mL/hr over 90 Minutes Intravenous Every 24 hours 06/23/23 1242 06/23/23 1244   06/23/23 1201  vancomycin (VANCOCIN) IVPB 1000 mg/200 mL premix  Status:  Discontinued       Placed in "Followed by" Linked Group   1,000 mg 200 mL/hr over 60 Minutes Intravenous Every 24 hours 06/22/23 0042 06/23/23 1242   06/22/23 2200  ceFEPIme (MAXIPIME) 2 g in sodium chloride 0.9 % 100 mL IVPB  Status:  Discontinued        2 g 200 mL/hr over 30 Minutes Intravenous Every 12 hours 06/22/23 2120 06/23/23 1242   06/22/23 0200  piperacillin-tazobactam (ZOSYN) IVPB 3.375 g  Status:  Discontinued        3.375 g 12.5 mL/hr over 240 Minutes Intravenous Every 8 hours 06/22/23 0042 06/22/23 2117   06/22/23 0045  vancomycin (VANCOCIN) IVPB 1000 mg/200 mL premix       Placed in "Followed by" Linked Group   1,000 mg 200 mL/hr over 60 Minutes Intravenous STAT 06/22/23 0042 06/22/23 0254           Data Reviewed:  I have personally reviewed the following...  CBC: Recent Labs  Lab 06/27/23 0802 06/27/23 1608 06/28/23 0339 06/29/23 0359 06/30/23 0546  WBC 21.9* 30.2* 24.7* 18.3* 15.2*  HGB 10.2* 10.2* 9.7* 8.4* 8.5*  HCT 30.1* 29.9* 28.1* 24.3* 24.7*  MCV 88.0 86.7 85.2 87.7 87.0  PLT 368 420* 422* 348 383   Basic Metabolic Panel: Recent  Labs  Lab 06/24/23 0017 06/24/23 0017 06/24/23 0445 06/25/23 0645 06/26/23 0419 06/27/23 0621 06/27/23 0802 06/27/23 1349 06/28/23 0339 06/28/23 0340 06/29/23 0359 06/30/23 0546  NA  --    < > 132* 133* 137 QUESTIONABLE RESULTS, RECOMMEND RECOLLECT TO VERIFY 135 134*  --  134* 135 134*  K 3.5  --  3.6 4.4 4.4 QUESTIONABLE RESULTS,  RECOMMEND RECOLLECT TO VERIFY 5.0 4.7  --  3.9 4.2 4.0  CL  --    < > 104 109 111 QUESTIONABLE RESULTS, RECOMMEND RECOLLECT TO VERIFY 109 108  --  107 105 103  CO2  --    < > 21* 18* 21* QUESTIONABLE RESULTS, RECOMMEND RECOLLECT TO VERIFY 22 19*  --  22 22 24   GLUCOSE  --    < > 253* 230* 137* QUESTIONABLE RESULTS, RECOMMEND RECOLLECT TO VERIFY 162* 163*  --  79 174* 141*  BUN  --    < > 19 18 19  QUESTIONABLE RESULTS, RECOMMEND RECOLLECT TO VERIFY 21 24*  --  22 22 19   CREATININE  --    < > 0.72 0.79 0.74 0.75 0.76 0.79  --  0.73 0.77 0.73  CALCIUM  --    < > 7.5* 7.7* 8.1* QUESTIONABLE RESULTS, RECOMMEND RECOLLECT TO VERIFY 8.6* 8.6*  --  8.4* 8.0* 7.9*  MG 1.9  --  2.0 1.9 1.9  --   --   --  2.0  --   --   --   PHOS 2.0*  --  2.5 2.8 2.4* QUESTIONABLE RESULTS, RECOMMEND RECOLLECT TO VERIFY  --   --  2.9 2.9 3.2  --    < > = values in this interval not displayed.   GFR: Estimated Creatinine Clearance: 69.1 mL/min (by C-G formula based on SCr of 0.73 mg/dL). Liver Function Tests: Recent Labs  Lab 06/24/23 0445 06/25/23 0645 06/26/23 0419 06/27/23 0621 06/28/23 0340 06/29/23 0359  AST 16  --   --   --   --   --   ALT 14  --   --   --   --   --   ALKPHOS 113  --   --   --   --   --   BILITOT 0.4  --   --   --   --   --   PROT 5.5*  --   --   --   --   --   ALBUMIN 2.2* 1.9* 1.8* QUESTIONABLE RESULTS, RECOMMEND RECOLLECT TO VERIFY 2.0* 1.8*   No results for input(s): "LIPASE", "AMYLASE" in the last 168 hours. No results for input(s): "AMMONIA" in the last 168 hours. Coagulation Profile: No results for input(s): "INR", "PROTIME" in the last 168 hours. Cardiac Enzymes: Recent Labs  Lab 06/24/23 0445  CKTOTAL 34*   BNP (last 3 results) No results for input(s): "PROBNP" in the last 8760 hours. HbA1C: No results for input(s): "HGBA1C" in the last 72 hours. CBG: Recent Labs  Lab 06/29/23 1127 06/29/23 1727 06/29/23 2131 06/30/23 0949 06/30/23 1148  GLUCAP 140* 150* 174* 155*  137*   Lipid Profile: No results for input(s): "CHOL", "HDL", "LDLCALC", "TRIG", "CHOLHDL", "LDLDIRECT" in the last 72 hours. Thyroid Function Tests: No results for input(s): "TSH", "T4TOTAL", "FREET4", "T3FREE", "THYROIDAB" in the last 72 hours. Anemia Panel: Recent Labs    06/28/23 0339 06/30/23 0545  VITAMINB12  --  550  FOLATE 11.4  --   FERRITIN 86  --   TIBC 206*  --  IRON 37  --    Most Recent Urinalysis On File:     Component Value Date/Time   COLORURINE YELLOW (A) 05/11/2023 1317   APPEARANCEUR TURBID (A) 05/11/2023 1317   APPEARANCEUR Hazy (A) 08/12/2022 1430   LABSPEC 1.019 05/11/2023 1317   LABSPEC 1.010 01/09/2014 1108   PHURINE 5.0 05/11/2023 1317   GLUCOSEU NEGATIVE 05/11/2023 1317   GLUCOSEU Negative 01/09/2014 1108   HGBUR LARGE (A) 05/11/2023 1317   BILIRUBINUR NEGATIVE 05/11/2023 1317   BILIRUBINUR Negative 08/12/2022 1430   BILIRUBINUR Negative 01/09/2014 1108   KETONESUR 5 (A) 05/11/2023 1317   PROTEINUR 100 (A) 05/11/2023 1317   UROBILINOGEN 0.2 04/09/2016 1159   NITRITE POSITIVE (A) 05/11/2023 1317   LEUKOCYTESUR MODERATE (A) 05/11/2023 1317   LEUKOCYTESUR 3+ 01/09/2014 1108   Sepsis Labs: @LABRCNTIP (procalcitonin:4,lacticidven:4) Microbiology: Recent Results (from the past 240 hours)  Blood culture (routine x 2)     Status: None   Collection Time: 06/21/23  3:34 PM   Specimen: BLOOD  Result Value Ref Range Status   Specimen Description BLOOD LEFT ANTECUBITAL  Final   Special Requests   Final    BOTTLES DRAWN AEROBIC AND ANAEROBIC Blood Culture results may not be optimal due to an inadequate volume of blood received in culture bottles   Culture   Final    NO GROWTH 5 DAYS Performed at [facility], [address] [place], [place] [address]    Report Status 06/26/2023 FINAL  Final  MRSA Next Gen by PCR, Nasal     Status: None   Collection Time: 06/23/23  6:45 PM   Specimen: Nasal Mucosa; Nasal Swab  Result Value Ref Range  Status   MRSA by PCR Next Gen NOT DETECTED NOT DETECTED Final    Comment: (NOTE) The GeneXpert MRSA Assay (FDA approved for NASAL specimens only), is one component of a comprehensive MRSA colonization surveillance program. It is not intended to diagnose MRSA infection nor to guide or monitor treatment for MRSA infections. Test performance is not FDA approved in patients less than 33 years old. Performed at [facility], [address] [place], [place] [address]   Culture, blood (Routine X 2) w Reflex to ID Panel     Status: None   Collection Time: 06/23/23  7:18 PM   Specimen: BLOOD  Result Value Ref Range Status   Specimen Description BLOOD RIGHT ANTECUBITAL  Final   Special Requests   Final    BOTTLES DRAWN AEROBIC AND ANAEROBIC Blood Culture adequate volume   Culture   Final    NO GROWTH 5 DAYS Performed at [facility], [address] [place], [place] [address]    Report Status 06/28/2023 FINAL  Final  Aerobic/Anaerobic Culture w Gram Stain (surgical/deep wound)     Status: None   Collection Time: 06/24/23  6:42 AM   Specimen: Path fluid; Body Fluid  Result Value Ref Range Status   Specimen Description WOUND  Final   Special Requests LEFT GROIN  Final   Gram Stain NO WBC SEEN NO ORGANISMS SEEN   Final   Culture   Final    FEW ESCHERICHIA COLI NO ANAEROBES ISOLATED Performed at [facility], [address], [place], [place] [address]    Report Status 06/29/2023 FINAL  Final   Organism ID, Bacteria ESCHERICHIA COLI  Final      Susceptibility   Escherichia coli - MIC*    AMPICILLIN <=2 SENSITIVE Sensitive     CEFEPIME <=0.12  SENSITIVE Sensitive     CEFTAZIDIME <=1 SENSITIVE Sensitive     CEFTRIAXONE <=0.25 SENSITIVE Sensitive     CIPROFLOXACIN <=0.25 SENSITIVE Sensitive     GENTAMICIN <=1 SENSITIVE Sensitive     IMIPENEM <=0.25 SENSITIVE Sensitive     TRIMETH/SULFA <=20 SENSITIVE Sensitive     AMPICILLIN/SULBACTAM <=2 SENSITIVE Sensitive      PIP/TAZO <=4 SENSITIVE Sensitive ug/mL    * FEW ESCHERICHIA COLI  Aerobic/Anaerobic Culture w Gram Stain (surgical/deep wound)     Status: None   Collection Time: 06/24/23 12:55 PM   Specimen: Path Tissue  Result Value Ref Range Status   Specimen Description WOUND  Final   Special Requests LEFT GROIN  Final   Gram Stain NO WBC SEEN NO ORGANISMS SEEN   Final   Culture   Final    FEW ESCHERICHIA COLI SUSCEPTIBILITIES PERFORMED ON PREVIOUS CULTURE WITHIN THE LAST 5 DAYS. NO ANAEROBES ISOLATED Performed at [facility], [address], [place], [place] [address]    Report Status 06/29/2023 FINAL  Final      Radiology Studies last 3 days: No results found.      Sunnie Nielsen, DO [facility] 06/30/2023, 2:03 PM    Dictation software may have been used to generate the above note. Typos may occur and escape review in typed/dictated notes. Please contact Dr Lyn Hollingshead directly for clarity if needed.  Staff may message me via secure chat in Epic  but this may not receive an immediate response,  please page me for urgent matters!  If 7PM-7AM, please contact night coverage [email or web address]

## 2023-06-30 NOTE — Progress Notes (Signed)
Progress Note    06/30/2023 10:17 AM 1 Day Post-Op  Subjective: Laura Reed is a 62 year old female now status postop day 1 from wound VAC washout and change in the operating room.  Wound VAC working well with noted 30 cc of serous drainage to the canister.  No complaints overnight.  Patient continues to work with physical therapy.  Vitals are remained stable.   Vitals:   06/29/23 2308 06/30/23 0848  BP: (!) 121/46 (!) 110/58  Pulse: 72 71  Resp: 17 16  Temp: 99.1 F (37.3 C) 98.5 F (36.9 C)  SpO2: 94% 100%   Physical Exam: Cardiac:  RRR, Normal S1,S2. No murmurs appreciated.  Bradycardia resolved. Lungs: Clear on auscultation throughout but remains diminished in the bases.  Nonlabored breathing.  Nonproductive cough this morning.  Instructed to use bedside incentive spirometry. Incisions: Left groin with wound VAC in place.  Working well. Extremities: Bilateral lower extremities warm to touch.  Left lower extremity with +2 edema.  Able to palpate pulses but has positive Doppler pulses. Abdomen: Positive bowel sounds throughout, soft, nontender nondistended. Neurologic: Alert and oriented x 3, answers all questions and follows commands appropriately.  CBC    Component Value Date/Time   WBC 15.2 (H) 06/30/2023 0546   RBC 2.84 (L) 06/30/2023 0546   HGB 8.5 (L) 06/30/2023 0546   HGB 9.5 (L) 11/02/2014 0609   HCT 24.7 (L) 06/30/2023 0546   HCT 29.5 (L) 11/02/2014 0609   PLT 383 06/30/2023 0546   PLT 217 11/02/2014 0609   MCV 87.0 06/30/2023 0546   MCV 87 11/02/2014 0609   MCH 29.9 06/30/2023 0546   MCHC 34.4 06/30/2023 0546   RDW 14.2 06/30/2023 0546   RDW 13.6 11/02/2014 0609   LYMPHSABS 1.3 09/08/2022 1044   LYMPHSABS 1.2 11/02/2014 0609   MONOABS 0.7 09/08/2022 1044   MONOABS 1.1 (H) 11/02/2014 0609   EOSABS 0.1 09/08/2022 1044   EOSABS 0.3 11/02/2014 0609   BASOSABS 0.1 09/08/2022 1044   BASOSABS 0.0 11/02/2014 0609    BMET    Component Value Date/Time    NA 134 (L) 06/30/2023 0546   NA 136 08/02/2021 1629   NA 135 11/02/2014 0609   K 4.0 06/30/2023 0546   K 4.2 11/02/2014 0609   CL 103 06/30/2023 0546   CL 107 11/02/2014 0609   CO2 24 06/30/2023 0546   CO2 23 11/02/2014 0609   GLUCOSE 141 (H) 06/30/2023 0546   GLUCOSE 108 (H) 11/02/2014 0609   BUN 19 06/30/2023 0546   BUN 25 08/02/2021 1629   BUN 20 11/02/2014 0609   CREATININE 0.73 06/30/2023 0546   CREATININE 1.01 11/09/2015 1549   CREATININE 1.01 11/09/2015 1549   CALCIUM 7.9 (L) 06/30/2023 0546   CALCIUM 7.3 (L) 11/02/2014 0609   GFRNONAA >60 06/30/2023 0546   GFRNONAA 50 (L) 11/02/2014 0609   GFRAA >60 01/19/2019 0357   GFRAA 58 (L) 11/02/2014 0609    INR    Component Value Date/Time   INR 1.1 06/21/2023 2304   INR 0.9 10/17/2014 1131     Intake/Output Summary (Last 24 hours) at 06/30/2023 1017 Last data filed at 06/30/2023 0300 Gross per 24 hour  Intake 200 ml  Output --  Net 200 ml     Assessment/Plan:  62 y.o. female is s/p wound VAC washout with wound VAC change.  1 Day Post-Op   Plan Vascular surgery plans on taking the patient back to the operating room on 07/02/2023 for another wound  VAC change out and assessment of the satoreotide flap in her groin.  Patient was reminded should be made n.p.o. after midnight on 07/02/23.  She verbalized her understanding.  Wishes to proceed.  Dr. [name] MD is made aware of the plan and he agrees with plan.  DVT prophylaxis: Eliquis 2.5 twice daily, aspirin 81 mg daily, and Lipitor 20 daily   Marcie Bal [facility] 06/30/2023 10:17 AM

## 2023-06-30 NOTE — Hospital Course (Addendum)
HPI: 62 y.o female with significant PMH of HTN, HLD, DM, sCHF, CAD, PAD (s/p of left common femoral-distal bypass, thrombectomy, angioplasty and stent placement 04/2023 on aspirin, Plavix and Eliquis), anxiety, bladder cancer (s/p of nephrectomy). [facility] on 06/21/23 with complaints of right groin pain, redness and oozing post op. Patient has hx of PAD and recently underwent left common femoral-distal bypass, thrombectomy, angioplasty and stent placement 04/2023 and right transmetatarsal amputation. SBP in the 50s and was sent to the ED for further evaluation.   Hospital course / significant events:  12/16: admitted to hospitalist service later transferred to ICU for pressor support.  12/18: to OR, 200 cc pus drained from L groin. ID consulted.  12/19: remains stable off pressors 12/20: to OR, left groin debridement, muscle flap, wound VAC exchange  12/23: wound vac exchange. Wound appears appropriate and flap appears viable. On Eliquis, ASA. Still holding Plavix and can probably resume once confirm no further plans for OR.  12/24-12/25: stable, planning repeat wound vac change 12/26 12/26: irrigation/debridement and vac change today.  12/27: await return to OR Sunday 12/29 for wound vac exchange. Diuresing for bilateral LE edema, Hx HFpEf   Consultants:  [facility] Vascular Surgery  Infectious disease   Procedures/Surgeries: 06/24/23: Excisional debridement of left groin abscess. Rotation of a sartorius muscle flap for coverage of the existing bypass grafts. Placement of a nondisposable wound VAC dressing left groin. Redo vascular surgery - Drs. [name] and Dew 06/26/23: Excisional debridement left groin wound. Placement of a nondisposable VAC dressing - Dr Gilda Crease 06/29/23: Left groin VAC dressing change - Dr Gilda Crease   07/02/23: Irrigation and debridement of skin, soft tissue, and muscle for approximately 125 cm to the left groin wound, Pulse lavage irrigation left groin. Left groin VAC  dressing change - Dr Wyn Quaker       ASSESSMENT & PLAN:   Left groin abscess Sepsis with septic shock resolved Status post I&D with vascular surgery 12/18 with takeback to the OR for left groin debridement, muscle flap, wound VAC exchange 12/20, wound vac exchange 12/23 continue antimicrobials per ID Pain control Vascular following, likely for another wound vac exchange in the OR Mon 12/30   PAD s/p of left common femoral-tibioperoneal trunk bypass, thrombectomy, angioplasty and stent placement 04/2023  on aspirin, Plavix and Eliquis  Continue eliquis and aspirin Continue statin plavix is being held. Can likely resume once no further plans for OR    Leukocytosis  In the setting of her infection and multiple trips to the OR.   Monitor CBC Per ID consider transition to po abx once WBC improving    Normocytic anemia Expected ABLA d/t surgeries normal hemoglobin prior to vascular procedures.   some blood tinge output in her wound VAC S/p 1 unit of packed red blood cells by vascular team.  Can discharge on iron supplemntation  Continue to monitor CBC   AKI  Likely d/t septic shock resolved Monitor BMP   PVC Patient noted to have frequent PVCs on tele This is likely causing her heart rate to appear to be lower than normal.  Will ensure potassium >4, Magnesium>2 Continue to monitor   Hyponatremia  Resolved monitor with improving PO intake.    Chronic diastolic HF Edema bilateral LE Monitor fluid status  Diuresis prn - giving another dose IV lasix today  Monitor BMP w/ diuresis  Hypoalbuminemia  Encourage po intake   HTN  HLD Continue statin Hold home antihypertensives. Mostly normotensive off of midodrine Continue to monitor  blood pressure   T2DM  A1c 8 Continue long-acting 15 units Continue mealtime 3 units 3 times daily      DVT prophylaxis: ELiquis  IV fluids: no continuous IV fluids  Nutrition: carb modified  Central lines / invasive devices: wound  vac   Code Status: FULL CODE ACP documentation reviewed:  HCPOA and living will are on file in [facility]  [facility] needs: home health  Barriers to dispo / significant pending items: wound vac in place, iv abx, plannign wound vac change in OR

## 2023-06-30 NOTE — Progress Notes (Signed)
ID Pt sitting in recliner Sister in the room Pt is constipated Appetite poor Ambulated with PT  O/e awake and alert Patient Vitals for the past 24 hrs:  BP Temp Temp src Pulse Resp SpO2 Weight  06/30/23 1558 (!) 115/47 98.7 F (37.1 C) Oral 70 18 100 % --  06/30/23 0848 (!) 110/58 98.5 F (36.9 C) -- 71 16 100 % --  06/30/23 0500 -- -- -- -- -- -- 73.2 kg  06/29/23 2308 (!) 121/46 99.1 F (37.3 C) -- 72 17 94 % --   Chest CTA Hss1s2 Abd soft Left groin wound vac CNS non focal Left TMA   Labs    Latest Ref Rng & Units 06/30/2023    5:46 AM 06/29/2023    3:59 AM 06/28/2023    3:39 AM  CBC  WBC 4.0 - 10.5 K/uL 15.2  18.3  24.7   Hemoglobin 12.0 - 15.0 g/dL 8.5  8.4  9.7   Hematocrit 36.0 - 46.0 % 24.7  24.3  28.1   Platelets 150 - 400 K/uL 383  348  422        Latest Ref Rng & Units 06/30/2023    5:46 AM 06/29/2023    3:59 AM 06/28/2023    3:40 AM  CMP  Glucose 70 - 99 mg/dL 161  096  79   BUN 8 - 23 mg/dL 19  22  22    Creatinine 0.44 - 1.00 mg/dL 0.45  4.09  8.11   Sodium 135 - 145 mmol/L 134  135  134   Potassium 3.5 - 5.1 mmol/L 4.0  4.2  3.9   Chloride 98 - 111 mmol/L 103  105  107   CO2 22 - 32 mmol/L 24  22  22    Calcium 8.9 - 10.3 mg/dL 7.9  8.0  8.4     Micro Groin abscess- Ecoli  Impression/recommendation PAD s/p left common femoral artery to tibioperoneal trunk artery bypass  with PTFE graft in OCT 2024   Abscess due to Ecoli  at the surgical site- CT showed  10 cm complex collection s/p excisional debridement of the abscess and rotation of a sarorius muscle flap for coverage of existing bypass grafts 06/24/23   Ecoli in culture so far Pan sensitive- on unasyn   Leucocytosis-improving   - will check with vascular regarding the depth of the abscess and involvement of vasculature   Left TMA- healed well  ecoli and enterococcus in  culture in sept when she had TMA   CAD s/p CABG   H/o bladder ca s/p left nephrectomy  Solitary kidney- avoid  nephrotoxic drugs   AKI-  resolved   Discussed the management with the patient and her sister  ID will follow her peripherally tomorrow

## 2023-07-01 DIAGNOSIS — L02214 Cutaneous abscess of groin: Secondary | ICD-10-CM | POA: Diagnosis not present

## 2023-07-01 DIAGNOSIS — I739 Peripheral vascular disease, unspecified: Secondary | ICD-10-CM | POA: Diagnosis not present

## 2023-07-01 DIAGNOSIS — D72829 Elevated white blood cell count, unspecified: Secondary | ICD-10-CM | POA: Diagnosis not present

## 2023-07-01 LAB — GLUCOSE, CAPILLARY
Glucose-Capillary: 87 mg/dL (ref 70–99)
Glucose-Capillary: 152 mg/dL — ABNORMAL HIGH (ref 70–99)
Glucose-Capillary: 186 mg/dL — ABNORMAL HIGH (ref 70–99)
Glucose-Capillary: 172 mg/dL — ABNORMAL HIGH (ref 70–99)

## 2023-07-01 NOTE — Plan of Care (Signed)
  Problem: Education: Goal: Knowledge of General Education information will improve Description Including pain rating scale, medication(s)/side effects and non-pharmacologic comfort measures Outcome: Progressing   Problem: Activity: Goal: Risk for activity intolerance will decrease Outcome: Progressing   Problem: Safety: Goal: Ability to remain free from injury will improve Outcome: Progressing

## 2023-07-01 NOTE — Plan of Care (Signed)

## 2023-07-01 NOTE — Progress Notes (Signed)
PROGRESS NOTE    Laura Reed   WJX:[id number] DOB: [birth year]  DOA: 06/21/2023 Date of Service: 07/01/23 which is hospital day 10  PCP: Sherlene Shams, MD    HPI: 62 y.o female with significant PMH of HTN, HLD, DM, sCHF, CAD, PAD (s/p of left common femoral-distal bypass, thrombectomy, angioplasty and stent placement 04/2023 on aspirin, Plavix and Eliquis), anxiety, bladder cancer (s/p of nephrectomy). [facility] on 06/21/23 with complaints of right groin pain, redness and oozing post op. Patient has hx of PAD and recently underwent left common femoral-distal bypass, thrombectomy, angioplasty and stent placement 04/2023 and right transmetatarsal amputation. SBP in the 50s and was sent to the ED for further evaluation.   Hospital course / significant events:  12/16: admitted to hospitalist service later transferred to ICU for pressor support.  12/17: in ICU, more stable  12/18: to OR, 200 cc pus drained from L groin. ID consulted.  12/19: remains stable off pressors, optimized for transfer back to hospitalist service  12/20: transfer back to [facility]. To OR, left groin debridement, muscle flap, wound VAC exchange  12/23: wound vac exchange. Per vascular surgery, wound appears appropriate and flap appears viable. On Eliquis, ASA. Still holding Plavix and can probably resume once confirm no further plans for OR. Question rehab vs home, continue working w/ PT/OT  12/24-12/25: stable, planning repeat wound vac change 12/26   Consultants:  [facility] Vascular Surgery  Infectious disease   Procedures/Surgeries: 06/24/23: Excisional debridement of left groin abscess. Rotation of a sartorius muscle flap for coverage of the existing bypass grafts. Placement of a nondisposable wound VAC dressing left groin. Redo vascular surgery - Drs. [name] and Dew 06/26/23: Excisional debridement left groin wound. Placement of a nondisposable VAC dressing - Dr Gilda Crease 06/29/23: Left groin VAC dressing change -  Dr Gilda Crease         ASSESSMENT & PLAN:   Left groin abscess Sepsis with septic shock resolved Status post I&D with vascular surgery 12/18 with takeback to the OR for left groin debridement, muscle flap, wound VAC exchange 12/20, wound vac exchange 12/23 continue antimicrobials per ID, currently on Unasyn Pain control Vascular following    PAD s/p of left common femoral-tibioperoneal trunk bypass, thrombectomy, angioplasty and stent placement 04/2023  on aspirin, Plavix and Eliquis  Continue eliquis and aspirin Continue statin plavix is being held. Can likely resume once no further plans for OR    Leukocytosis  In the setting of her infection and multiple trips to the OR.   Monitor CBC Per ID consider transition to po abx once WBC improving    Normocytic anemia Expected ABLA d/t surgeries normal hemoglobin prior to vascular procedures.   some blood tinge output in her wound VAC S/p 1 unit of packed red blood cells by vascular team.  Can discharge on iron supplemntation  Continue to monitor CBC   AKI  Likely d/t septic shock resolved Monitor BMP   PVC Patient noted to have frequent PVCs on tele This is likely causing her heart rate to appear to be lower than normal.  Will ensure potassium >4, Magnesium>2 Continue to monitor   Hyponatremia  Resolved monitor with improving PO intake.    Chronic diastolic HF Edema distal to LLE wound Monitor fluid status  Diuresis prn, pt would like to hold off on "fluid pills" for now    Hypoalbuminemia  Encourage po intake   HTN  HLD Continue statin Hold home antihypertensives. Mostly normotensive off of midodrine Continue  to monitor blood pressure   T2DM  A1c 8 Continue long-acting 15 units Continue mealtime 3 units 3 times daily      DVT prophylaxis: ELiquis  IV fluids: no continuous IV fluids  Nutrition: carb modified  Central lines / invasive devices: wound vac   Code Status: FULL CODE ACP documentation  reviewed:  HCPOA and living will are on file in [facility]  [facility] needs: home health  Barriers to dispo / significant pending items: wound vac in place, iv abx, plannign wound vac change in OR tomorrow              Subjective / Brief ROS:  Patient reports doing well today, feels better sitting up in chair, still some leg pain  Denies CP/SOB.  Pain controlled.  Denies new weakness.  Tolerating diet.  Reports no concerns w/ urination/defecation.   Family Communication: none at this time     Objective Findings:  Vitals:   06/30/23 1558 06/30/23 2111 07/01/23 0500 07/01/23 0824  BP: (!) 115/47 (!) 106/58  (!) 125/55  Pulse: 70 71  66  Resp: 18 18  16   Temp: 98.7 F (37.1 C) 98.7 F (37.1 C)  98.6 F (37 C)  TempSrc: Oral   Oral  SpO2: 100% 99%  97%  Weight:   74.7 kg   Height:        Intake/Output Summary (Last 24 hours) at 07/01/2023 1319 Last data filed at 07/01/2023 1137 Gross per 24 hour  Intake 200 ml  Output 950 ml  Net -750 ml   Filed Weights   06/26/23 1036 06/30/23 0500 07/01/23 0500  Weight: 74.5 kg 73.2 kg 74.7 kg    Examination:  Physical Exam Constitutional:      General: She is not in acute distress. Cardiovascular:     Rate and Rhythm: Normal rate and regular rhythm.  Pulmonary:     Effort: Pulmonary effort is normal.     Breath sounds: Normal breath sounds.  Neurological:     General: No focal deficit present.     Mental Status: She is alert and oriented to person, place, and time. Mental status is at baseline.  Psychiatric:        Mood and Affect: Mood normal.        Behavior: Behavior normal.          Scheduled Medications:   apixaban  2.5 mg Oral BID   aspirin  81 mg Oral QHS   atorvastatin  20 mg Oral Daily   Chlorhexidine Gluconate Cloth  6 each Topical Daily   cholecalciferol  2,000 Units Oral Daily   gabapentin  300 mg Oral BID   insulin aspart  0-15 Units Subcutaneous TID AC & HS   insulin aspart  3 Units  Subcutaneous TID WC   insulin glargine-yfgn  15 Units Subcutaneous QHS   multivitamin with minerals  1 tablet Oral Daily   pantoprazole (PROTONIX) IV  40 mg Intravenous Q24H   polyethylene glycol  17 g Oral Daily   senna  1 tablet Oral Daily    Continuous Infusions:  ampicillin-sulbactam (UNASYN) IV 3 g (07/01/23 1003)    PRN Medications:  acetaminophen, ALPRAZolam, alum & mag hydroxide-simeth, fentaNYL (SUBLIMAZE) injection, ondansetron (ZOFRAN) IV, oxyCODONE-acetaminophen  Antimicrobials from admission:  Anti-infectives (From admission, onward)    Start     Dose/Rate Route Frequency Ordered Stop   06/29/23 0620  ceFAZolin (ANCEF) IVPB 2g/100 mL premix        2 g 200  mL/hr over 30 Minutes Intravenous 30 min pre-op 06/29/23 0620 06/29/23 0747   06/26/23 1800  Ampicillin-Sulbactam (UNASYN) 3 g in sodium chloride 0.9 % 100 mL IVPB        3 g 200 mL/hr over 30 Minutes Intravenous Every 6 hours 06/26/23 1646     06/24/23 1322  gentamicin (GARAMYCIN) injection  Status:  Discontinued          As needed 06/24/23 1322 06/24/23 1347   06/24/23 1303  tobramycin (NEBCIN) powder  Status:  Discontinued          As needed 06/24/23 1303 06/24/23 1347   06/24/23 1303  vancomycin (VANCOCIN) powder  Status:  Discontinued          As needed 06/24/23 1304 06/24/23 1347   06/24/23 1200  vancomycin (VANCOREADY) IVPB 1250 mg/250 mL  Status:  Discontinued       Placed in "Followed by" Linked Group   1,250 mg 166.7 mL/hr over 90 Minutes Intravenous Every 24 hours 06/23/23 1244 06/23/23 1651   06/24/23 1000  DAPTOmycin (CUBICIN) IVPB 500 mg/65mL premix  Status:  Discontinued        8 mg/kg  64.1 kg 100 mL/hr over 30 Minutes Intravenous Daily 06/23/23 1651 06/26/23 1646   06/24/23 0234  ceFAZolin (ANCEF) IVPB 2g/100 mL premix        2 g 200 mL/hr over 30 Minutes Intravenous 30 min pre-op 06/24/23 0234 06/24/23 1300   06/23/23 1800  ceFEPIme (MAXIPIME) 2 g in sodium chloride 0.9 % 100 mL IVPB  Status:   Discontinued        2 g 200 mL/hr over 30 Minutes Intravenous Every 8 hours 06/23/23 1242 06/26/23 1646   06/23/23 1330  vancomycin (VANCOREADY) IVPB 1250 mg/250 mL  Status:  Discontinued       Placed in "Followed by" Linked Group   1,250 mg 166.7 mL/hr over 90 Minutes Intravenous Every 24 hours 06/23/23 1242 06/23/23 1244   06/23/23 1201  vancomycin (VANCOCIN) IVPB 1000 mg/200 mL premix  Status:  Discontinued       Placed in "Followed by" Linked Group   1,000 mg 200 mL/hr over 60 Minutes Intravenous Every 24 hours 06/22/23 0042 06/23/23 1242   06/22/23 2200  ceFEPIme (MAXIPIME) 2 g in sodium chloride 0.9 % 100 mL IVPB  Status:  Discontinued        2 g 200 mL/hr over 30 Minutes Intravenous Every 12 hours 06/22/23 2120 06/23/23 1242   06/22/23 0200  piperacillin-tazobactam (ZOSYN) IVPB 3.375 g  Status:  Discontinued        3.375 g 12.5 mL/hr over 240 Minutes Intravenous Every 8 hours 06/22/23 0042 06/22/23 2117   06/22/23 0045  vancomycin (VANCOCIN) IVPB 1000 mg/200 mL premix       Placed in "Followed by" Linked Group   1,000 mg 200 mL/hr over 60 Minutes Intravenous STAT 06/22/23 0042 06/22/23 0254           Data Reviewed:  I have personally reviewed the following...  CBC: Recent Labs  Lab 06/27/23 0802 06/27/23 1608 06/28/23 0339 06/29/23 0359 06/30/23 0546  WBC 21.9* 30.2* 24.7* 18.3* 15.2*  HGB 10.2* 10.2* 9.7* 8.4* 8.5*  HCT 30.1* 29.9* 28.1* 24.3* 24.7*  MCV 88.0 86.7 85.2 87.7 87.0  PLT 368 420* 422* 348 383   Basic Metabolic Panel: Recent Labs  Lab 06/25/23 0645 06/26/23 0419 06/27/23 0621 06/27/23 0802 06/27/23 1349 06/28/23 0339 06/28/23 0340 06/29/23 0359 06/30/23 0546  NA 133* 137 QUESTIONABLE  RESULTS, RECOMMEND RECOLLECT TO VERIFY 135 134*  --  134* 135 134*  K 4.4 4.4 QUESTIONABLE RESULTS, RECOMMEND RECOLLECT TO VERIFY 5.0 4.7  --  3.9 4.2 4.0  CL 109 111 QUESTIONABLE RESULTS, RECOMMEND RECOLLECT TO VERIFY 109 108  --  107 105 103  CO2 18* 21*  QUESTIONABLE RESULTS, RECOMMEND RECOLLECT TO VERIFY 22 19*  --  22 22 24   GLUCOSE 230* 137* QUESTIONABLE RESULTS, RECOMMEND RECOLLECT TO VERIFY 162* 163*  --  79 174* 141*  BUN 18 19 QUESTIONABLE RESULTS, RECOMMEND RECOLLECT TO VERIFY 21 24*  --  22 22 19   CREATININE 0.79 0.74 0.75 0.76 0.79  --  0.73 0.77 0.73  CALCIUM 7.7* 8.1* QUESTIONABLE RESULTS, RECOMMEND RECOLLECT TO VERIFY 8.6* 8.6*  --  8.4* 8.0* 7.9*  MG 1.9 1.9  --   --   --  2.0  --   --   --   PHOS 2.8 2.4* QUESTIONABLE RESULTS, RECOMMEND RECOLLECT TO VERIFY  --   --  2.9 2.9 3.2  --    GFR: Estimated Creatinine Clearance: 74.9 mL/min (by C-G formula based on SCr of 0.73 mg/dL). Liver Function Tests: Recent Labs  Lab 06/25/23 0645 06/26/23 0419 06/27/23 0621 06/28/23 0340 06/29/23 0359  ALBUMIN 1.9* 1.8* QUESTIONABLE RESULTS, RECOMMEND RECOLLECT TO VERIFY 2.0* 1.8*   No results for input(s): "LIPASE", "AMYLASE" in the last 168 hours. No results for input(s): "AMMONIA" in the last 168 hours. Coagulation Profile: No results for input(s): "INR", "PROTIME" in the last 168 hours. Cardiac Enzymes: No results for input(s): "CKTOTAL", "CKMB", "CKMBINDEX", "TROPONINI" in the last 168 hours.  BNP (last 3 results) No results for input(s): "PROBNP" in the last 8760 hours. HbA1C: No results for input(s): "HGBA1C" in the last 72 hours. CBG: Recent Labs  Lab 06/30/23 1148 06/30/23 1554 06/30/23 2112 07/01/23 0822 07/01/23 1123  GLUCAP 137* 105* 94 87 152*   Lipid Profile: No results for input(s): "CHOL", "HDL", "LDLCALC", "TRIG", "CHOLHDL", "LDLDIRECT" in the last 72 hours. Thyroid Function Tests: No results for input(s): "TSH", "T4TOTAL", "FREET4", "T3FREE", "THYROIDAB" in the last 72 hours. Anemia Panel: Recent Labs    06/30/23 0545  VITAMINB12 550   Most Recent Urinalysis On File:     Component Value Date/Time   COLORURINE YELLOW (A) 05/11/2023 1317   APPEARANCEUR TURBID (A) 05/11/2023 1317   APPEARANCEUR Hazy  (A) 08/12/2022 1430   LABSPEC 1.019 05/11/2023 1317   LABSPEC 1.010 01/09/2014 1108   PHURINE 5.0 05/11/2023 1317   GLUCOSEU NEGATIVE 05/11/2023 1317   GLUCOSEU Negative 01/09/2014 1108   HGBUR LARGE (A) 05/11/2023 1317   BILIRUBINUR NEGATIVE 05/11/2023 1317   BILIRUBINUR Negative 08/12/2022 1430   BILIRUBINUR Negative 01/09/2014 1108   KETONESUR 5 (A) 05/11/2023 1317   PROTEINUR 100 (A) 05/11/2023 1317   UROBILINOGEN 0.2 04/09/2016 1159   NITRITE POSITIVE (A) 05/11/2023 1317   LEUKOCYTESUR MODERATE (A) 05/11/2023 1317   LEUKOCYTESUR 3+ 01/09/2014 1108   Sepsis Labs: @LABRCNTIP (procalcitonin:4,lacticidven:4) Microbiology: Recent Results (from the past 240 hours)  Blood culture (routine x 2)     Status: None   Collection Time: 06/21/23  3:34 PM   Specimen: BLOOD  Result Value Ref Range Status   Specimen Description BLOOD LEFT ANTECUBITAL  Final   Special Requests   Final    BOTTLES DRAWN AEROBIC AND ANAEROBIC Blood Culture results may not be optimal due to an inadequate volume of blood received in culture bottles   Culture   Final    NO GROWTH  5 DAYS Performed at [facility], [address] [place], [place] [address]    Report Status 06/26/2023 FINAL  Final  MRSA Next Gen by PCR, Nasal     Status: None   Collection Time: 06/23/23  6:45 PM   Specimen: Nasal Mucosa; Nasal Swab  Result Value Ref Range Status   MRSA by PCR Next Gen NOT DETECTED NOT DETECTED Final    Comment: (NOTE) The GeneXpert MRSA Assay (FDA approved for NASAL specimens only), is one component of a comprehensive MRSA colonization surveillance program. It is not intended to diagnose MRSA infection nor to guide or monitor treatment for MRSA infections. Test performance is not FDA approved in patients less than 62 years old. Performed at [facility], [address] [place], [place] [address]   Culture, blood (Routine X 2) w Reflex to ID Panel     Status: None   Collection Time:  06/23/23  7:18 PM   Specimen: BLOOD  Result Value Ref Range Status   Specimen Description BLOOD RIGHT ANTECUBITAL  Final   Special Requests   Final    BOTTLES DRAWN AEROBIC AND ANAEROBIC Blood Culture adequate volume   Culture   Final    NO GROWTH 5 DAYS Performed at [facility], [address], [place], [place] [address]    Report Status 06/28/2023 FINAL  Final  Aerobic/Anaerobic Culture w Gram Stain (surgical/deep wound)     Status: None   Collection Time: 06/24/23  6:42 AM   Specimen: Path fluid; Body Fluid  Result Value Ref Range Status   Specimen Description WOUND  Final   Special Requests LEFT GROIN  Final   Gram Stain NO WBC SEEN NO ORGANISMS SEEN   Final   Culture   Final    FEW ESCHERICHIA COLI NO ANAEROBES ISOLATED Performed at [facility], [address] [place], [place] [address]    Report Status 06/29/2023 FINAL  Final   Organism ID, Bacteria ESCHERICHIA COLI  Final      Susceptibility   Escherichia coli - MIC*    AMPICILLIN <=2 SENSITIVE Sensitive     CEFEPIME <=0.12 SENSITIVE Sensitive     CEFTAZIDIME <=1 SENSITIVE Sensitive     CEFTRIAXONE <=0.25 SENSITIVE Sensitive     CIPROFLOXACIN <=0.25 SENSITIVE Sensitive     GENTAMICIN <=1 SENSITIVE Sensitive     IMIPENEM <=0.25 SENSITIVE Sensitive     TRIMETH/SULFA <=20 SENSITIVE Sensitive     AMPICILLIN/SULBACTAM <=2 SENSITIVE Sensitive     PIP/TAZO <=4 SENSITIVE Sensitive ug/mL    * FEW ESCHERICHIA COLI  Aerobic/Anaerobic Culture w Gram Stain (surgical/deep wound)     Status: None   Collection Time: 06/24/23 12:55 PM   Specimen: Path Tissue  Result Value Ref Range Status   Specimen Description WOUND  Final   Special Requests LEFT GROIN  Final   Gram Stain NO WBC SEEN NO ORGANISMS SEEN   Final   Culture   Final    FEW ESCHERICHIA COLI SUSCEPTIBILITIES PERFORMED ON PREVIOUS CULTURE WITHIN THE LAST 5 DAYS. NO ANAEROBES ISOLATED Performed at [facility], [address],  [place], [place] [address]    Report Status 06/29/2023 FINAL  Final      Radiology Studies last 3 days: No results found.      Sunnie Nielsen, DO [facility] 07/01/2023, 1:19 PM    Dictation software may have been used to generate the above note. Typos may occur and escape review in typed/dictated notes.  Please contact Dr Lyn Hollingshead directly for clarity if needed.  Staff may message me via secure chat in Epic  but this may not receive an immediate response,  please page me for urgent matters!  If 7PM-7AM, please contact night coverage [email or web address]

## 2023-07-02 ENCOUNTER — Other Ambulatory Visit: Payer: Self-pay

## 2023-07-02 ENCOUNTER — Inpatient Hospital Stay: Payer: 59 | Admitting: Anesthesiology

## 2023-07-02 ENCOUNTER — Encounter
Admission: EM | Disposition: A | Discharge status: Home Health | Payer: Self-pay | Source: Home / Self Care | Attending: Internal Medicine

## 2023-07-02 DIAGNOSIS — L02214 Cutaneous abscess of groin: Secondary | ICD-10-CM | POA: Diagnosis not present

## 2023-07-02 DIAGNOSIS — Z9889 Other specified postprocedural states: Secondary | ICD-10-CM | POA: Diagnosis not present

## 2023-07-02 DIAGNOSIS — I739 Peripheral vascular disease, unspecified: Secondary | ICD-10-CM | POA: Diagnosis not present

## 2023-07-02 DIAGNOSIS — D72829 Elevated white blood cell count, unspecified: Secondary | ICD-10-CM | POA: Diagnosis not present

## 2023-07-02 DIAGNOSIS — T827XXA Infection and inflammatory reaction due to other cardiac and vascular devices, implants and grafts, initial encounter: Secondary | ICD-10-CM | POA: Diagnosis not present

## 2023-07-02 DIAGNOSIS — I97648 Postprocedural seroma of a circulatory system organ or structure following other circulatory system procedure: Secondary | ICD-10-CM | POA: Diagnosis not present

## 2023-07-02 HISTORY — PX: APPLICATION OF WOUND VAC: SHX5189

## 2023-07-02 LAB — CBC WITH DIFFERENTIAL/PLATELET
Abs Immature Granulocytes: 0.27 10*3/uL — ABNORMAL HIGH (ref 0.00–0.07)
Basophils Absolute: 0 10*3/uL (ref 0.0–0.1)
Basophils Relative: 0 %
Eosinophils Absolute: 0.2 10*3/uL (ref 0.0–0.5)
Eosinophils Relative: 2 %
HCT: 28.8 % — ABNORMAL LOW (ref 36.0–46.0)
Hemoglobin: 9.6 g/dL — ABNORMAL LOW (ref 12.0–15.0)
Immature Granulocytes: 2 %
Lymphocytes Relative: 8 %
Lymphs Abs: 1.2 10*3/uL (ref 0.7–4.0)
MCH: 29.7 pg (ref 26.0–34.0)
MCHC: 33.3 g/dL (ref 30.0–36.0)
MCV: 89.2 fL (ref 80.0–100.0)
Monocytes Absolute: 1 10*3/uL (ref 0.1–1.0)
Monocytes Relative: 7 %
Neutro Abs: 11.6 10*3/uL — ABNORMAL HIGH (ref 1.7–7.7)
Neutrophils Relative %: 81 %
Platelets: 431 10*3/uL — ABNORMAL HIGH (ref 150–400)
RBC: 3.23 MIL/uL — ABNORMAL LOW (ref 3.87–5.11)
RDW: 14.4 % (ref 11.5–15.5)
WBC: 14.3 10*3/uL — ABNORMAL HIGH (ref 4.0–10.5)
nRBC: 0 % (ref 0.0–0.2)

## 2023-07-02 LAB — GLUCOSE, CAPILLARY
Glucose-Capillary: 101 mg/dL — ABNORMAL HIGH (ref 70–99)
Glucose-Capillary: 108 mg/dL — ABNORMAL HIGH (ref 70–99)
Glucose-Capillary: 114 mg/dL — ABNORMAL HIGH (ref 70–99)
Glucose-Capillary: 146 mg/dL — ABNORMAL HIGH (ref 70–99)
Glucose-Capillary: 248 mg/dL — ABNORMAL HIGH (ref 70–99)

## 2023-07-02 SURGERY — APPLICATION, WOUND VAC
Anesthesia: General | Laterality: Left

## 2023-07-02 MED ORDER — EPHEDRINE SULFATE-NACL 50-0.9 MG/10ML-% IV SOSY
PREFILLED_SYRINGE | INTRAVENOUS | Status: DC | PRN
  Administered 2023-07-02 (×2): 5 mg via INTRAVENOUS

## 2023-07-02 MED ORDER — ONDANSETRON HCL 4 MG/2ML IJ SOLN
INTRAMUSCULAR | Status: AC
  Filled 2023-07-02: qty 2

## 2023-07-02 MED ORDER — DEXAMETHASONE SODIUM PHOSPHATE 10 MG/ML IJ SOLN
INTRAMUSCULAR | Status: AC
  Filled 2023-07-02: qty 1

## 2023-07-02 MED ORDER — MIDAZOLAM HCL 2 MG/2ML IJ SOLN
INTRAMUSCULAR | Status: AC
  Filled 2023-07-02: qty 2

## 2023-07-02 MED ORDER — PROPOFOL 10 MG/ML IV BOLUS
INTRAVENOUS | Status: AC
  Filled 2023-07-02: qty 60

## 2023-07-02 MED ORDER — OXYCODONE HCL 5 MG PO TABS: 5.0000 mg | ORAL_TABLET | Freq: Once | ORAL | Status: DC | PRN

## 2023-07-02 MED ORDER — SODIUM CHLORIDE 0.9 % IV SOLN: INTRAVENOUS | Status: DC | PRN

## 2023-07-02 MED ORDER — OXYCODONE HCL 5 MG/5ML PO SOLN: 5.0000 mg | Freq: Once | ORAL | Status: DC | PRN

## 2023-07-02 MED ORDER — PROPOFOL 500 MG/50ML IV EMUL
INTRAVENOUS | Status: DC | PRN
  Administered 2023-07-02: 100 ug/kg/min via INTRAVENOUS

## 2023-07-02 MED ORDER — FENTANYL CITRATE (PF) 100 MCG/2ML IJ SOLN
INTRAMUSCULAR | Status: AC
  Filled 2023-07-02: qty 2

## 2023-07-02 MED ORDER — MIDAZOLAM HCL 2 MG/2ML IJ SOLN
INTRAMUSCULAR | Status: DC | PRN
  Administered 2023-07-02: 2 mg via INTRAVENOUS

## 2023-07-02 MED ORDER — FENTANYL CITRATE (PF) 100 MCG/2ML IJ SOLN
INTRAMUSCULAR | Status: DC | PRN
  Administered 2023-07-02: 50 ug via INTRAVENOUS
  Administered 2023-07-02 (×2): 25 ug via INTRAVENOUS

## 2023-07-02 MED ORDER — LIDOCAINE HCL (PF) 2 % IJ SOLN
INTRAMUSCULAR | Status: AC
  Filled 2023-07-02: qty 10

## 2023-07-02 MED ORDER — GLYCOPYRROLATE 0.2 MG/ML IJ SOLN
INTRAMUSCULAR | Status: AC
  Filled 2023-07-02: qty 1

## 2023-07-02 MED ORDER — EPHEDRINE 5 MG/ML INJ
INTRAVENOUS | Status: AC
  Filled 2023-07-02: qty 5

## 2023-07-02 MED ORDER — FENTANYL CITRATE (PF) 100 MCG/2ML IJ SOLN: 25.0000 ug | INTRAMUSCULAR | Status: DC | PRN

## 2023-07-02 MED ORDER — ONDANSETRON HCL 4 MG/2ML IJ SOLN: 4.0000 mg | Freq: Once | INTRAMUSCULAR | Status: DC | PRN

## 2023-07-02 MED ORDER — 0.9 % SODIUM CHLORIDE (POUR BTL) OPTIME
TOPICAL | Status: DC | PRN
  Administered 2023-07-02: 3000 mL

## 2023-07-02 MED ORDER — PROPOFOL 1000 MG/100ML IV EMUL
INTRAVENOUS | Status: AC
  Filled 2023-07-02: qty 100

## 2023-07-02 SURGICAL SUPPLY — 22 items
CANISTER WOUND CARE 500ML ATS (WOUND CARE) ×1 IMPLANT
DRAPE INCISE IOBAN 66X45 STRL (DRAPES) ×1 IMPLANT
DRAPE LAPAROTOMY 100X77 ABD (DRAPES) ×1 IMPLANT
DRSG VAC GRANUFOAM LG (GAUZE/BANDAGES/DRESSINGS) ×1 IMPLANT
DRSG VAC GRANUFOAM MED (GAUZE/BANDAGES/DRESSINGS) ×1 IMPLANT
ELECT REM PT RETURN 9FT ADLT (ELECTROSURGICAL) ×1
ELECTRODE REM PT RTRN 9FT ADLT (ELECTROSURGICAL) ×1 IMPLANT
GAUZE 4X4 16PLY ~~LOC~~+RFID DBL (SPONGE) ×1 IMPLANT
GLOVE BIO SURGEON STRL SZ7 (GLOVE) ×2 IMPLANT
GOWN STRL REUS W/ TWL LRG LVL3 (GOWN DISPOSABLE) ×2 IMPLANT
GOWN STRL REUS W/TWL 2XL LVL3 (GOWN DISPOSABLE) ×1 IMPLANT
KIT TURNOVER KIT A (KITS) ×1 IMPLANT
MANIFOLD NEPTUNE II (INSTRUMENTS) ×1 IMPLANT
NS IRRIG 1000ML POUR BTL (IV SOLUTION) ×1 IMPLANT
PACK BASIN MINOR ARMC (MISCELLANEOUS) ×1 IMPLANT
PULSAVAC PLUS IRRIG FAN TIP (DISPOSABLE) ×1
SOL PREP PVP 2OZ (MISCELLANEOUS) ×1
SOLUTION PREP PVP 2OZ (MISCELLANEOUS) ×1 IMPLANT
SPONGE T-LAP 18X18 ~~LOC~~+RFID (SPONGE) ×1 IMPLANT
TIP FAN IRRIG PULSAVAC PLUS (DISPOSABLE) IMPLANT
TRAP FLUID SMOKE EVACUATOR (MISCELLANEOUS) ×1 IMPLANT
WATER STERILE IRR 500ML POUR (IV SOLUTION) ×1 IMPLANT

## 2023-07-02 NOTE — Plan of Care (Signed)
  Problem: Skin Integrity: Goal: Risk for impaired skin integrity will decrease Outcome: Progressing   Problem: Pain Management: Goal: General experience of comfort will improve Outcome: Progressing   Problem: Safety: Goal: Ability to remain free from injury will improve Outcome: Progressing   Problem: Skin Integrity: Goal: Risk for impaired skin integrity will decrease Outcome: Progressing

## 2023-07-02 NOTE — Plan of Care (Signed)
  Problem: Coping: Goal: Ability to adjust to condition or change in health will improve Outcome: Progressing   Problem: Fluid Volume: Goal: Ability to maintain a balanced intake and output will improve Outcome: Progressing   Problem: Health Behavior/Discharge Planning: Goal: Ability to manage health-related needs will improve Outcome: Progressing   Problem: Nutritional: Goal: Maintenance of adequate nutrition will improve Outcome: Progressing Goal: Progress toward achieving an optimal weight will improve Outcome: Progressing

## 2023-07-02 NOTE — Op Note (Signed)
    OPERATIVE NOTE   PROCEDURE: Irrigation and debridement of skin, soft tissue, and muscle for approximately 125 cm to the left groin wound Pulse lavage irrigation left groin Negative pressure dressing change (nondisposable) left groin  PRE-OPERATIVE DIAGNOSIS: Nonviable tissue and infection of   left groin wound  POST-OPERATIVE DIAGNOSIS: Same as above  SURGEON: [name], MD  ASSISTANT(S): Rolla Plate, NP  ANESTHESIA: MAC  ESTIMATED BLOOD LOSS: 5 cc  FINDING(S): Fibrinous exudate and soupy tissue particular on the medial aspect of the wound.  The sartorius flap was covering 80 to 90% of the prosthetic bypass material but there was an exposed area on the femoral to femoral bypass.  SPECIMEN(S): Wound culture sent  INDICATIONS:   Laura Reed is a 62 y.o. female who presents with an open left groin wound after previous femoral to femoral bypass and femoral to popliteal bypass with prosthetic.  She had a seroma that became infected that was drained couple of weeks ago.  She is being brought back to the operating room for debridement and VAC dressing changes due to the exposed bypass graft.  She has previously had a sartorius flap placement. An assistant was present during the procedure to help facilitate the exposure and expedite the procedure.   DESCRIPTION: After obtaining full informed written consent, the patient was brought back to the operating room and placed supine upon the operating table.  The patient received IV antibiotics prior to induction.  After obtaining adequate anesthesia, the patient was prepped and draped in the standard fashion. The assistant provided retraction and mobilization to help facilitate exposure and expedite the procedure throughout the entire procedure.  This included following suture, using retractors, and optimizing lighting.   The wound was then opened and excisional debridement was performed to skin, soft tissue, and muscle to remove all clearly  non-viable tissue.  The majority of this tissue was in the medial aspect of the wound where there was some separation of the sartorius flap from the tissue in this location and a significant mount of soupy fibrinous exudate was located in this area.  A wound culture was sent from this area.  The tissue was taken back to bleeding tissue that appeared viable.  The debridement was performed with scissors and sponges and encompassed an area of approximately 125 cm2.  The wound had a length of about 16 to 17 cm, width of about 8 to 9 cm, and was at least 4 cm in depth and the deepest areas.  The wound was irrigated copiously with 3 L of pulse lavage irrigation.  After all clearly non-viable tissue was removed, a negative pressure dressing was cut to fit the wound.  The VAC sponge was tucked into the wound as much coverage of the prosthetic material is possible with a sartorius flap.  Strips of [place] were used and a good occlusive seal was obtained when it was connected to suction tubing.  The suction was reduced to 75 cm due to the prostatic bypass graft in the area. The patient was then awakened from anesthesia and taken to the recovery room in stable condition having tolerated the procedure well.  COMPLICATIONS: none  CONDITION: stable  [name]  07/02/2023, 8:16 AM   This note was created with Dragon Medical transcription system. Any errors in dictation are purely unintentional.

## 2023-07-02 NOTE — Progress Notes (Signed)
ID Pt sitting in recliner Sister in the room   O/e awake and alert Patient Vitals for the past 24 hrs:  BP Temp Temp src Pulse Resp SpO2 Height Weight  07/02/23 1514 (!) 103/55 97.8 F (36.6 C) Oral 70 19 98 % -- --  07/02/23 0907 126/66 98.2 F (36.8 C) Oral 89 19 96 % -- --  07/02/23 0830 (!) 130/57 -- -- 88 12 100 % -- --  07/02/23 0818 (!) 123/52 98 F (36.7 C) -- 91 12 97 % -- --  07/02/23 0712 138/60 97.9 F (36.6 C) Temporal 75 17 96 % 5\' 7"  (1.702 m) 73.2 kg  07/02/23 0639 -- -- -- -- -- -- -- 73.2 kg  07/01/23 2133 (!) 133/49 98.2 F (36.8 C) -- 68 20 99 % -- --   Chest CTA Hss1s2 Abd soft Left groin wound  picture reviewed Some slough      Edema left leg CNS non focal  Left TMA   Labs    Latest Ref Rng & Units 07/02/2023    4:44 PM 06/30/2023    5:46 AM 06/29/2023    3:59 AM  CBC  WBC 4.0 - 10.5 K/uL 14.3  15.2  18.3   Hemoglobin 12.0 - 15.0 g/dL 9.6  8.5  8.4   Hematocrit 36.0 - 46.0 % 28.8  24.7  24.3   Platelets 150 - 400 K/uL 431  383  348        Latest Ref Rng & Units 06/30/2023    5:46 AM 06/29/2023    3:59 AM 06/28/2023    3:40 AM  CMP  Glucose 70 - 99 mg/dL 010  932  79   BUN 8 - 23 mg/dL 19  22  22    Creatinine 0.44 - 1.00 mg/dL 3.55  7.32  2.02   Sodium 135 - 145 mmol/L 134  135  134   Potassium 3.5 - 5.1 mmol/L 4.0  4.2  3.9   Chloride 98 - 111 mmol/L 103  105  107   CO2 22 - 32 mmol/L 24  22  22    Calcium 8.9 - 10.3 mg/dL 7.9  8.0  8.4     Micro Groin abscess- Ecoli  Impression/recommendation PAD s/p left common femoral artery to tibioperoneal trunk artery bypass  with PTFE graft in OCT 2024   Abscess due to Ecoli  at the surgical site- CT showed  10 cm complex collection s/p excisional debridement of the abscess and rotation of a sarorius muscle flap for coverage of existing bypass grafts 06/24/23 Repeat culture sent today   Ecoli in culture so far Pan sensitive- on unasyn Day 8 of antibiotic May need up to 4  weeks--07/22/23 Continue IV, depending on the progression of wound we may be able to switch to PO on a future date   Leucocytosis-improving   -    Left TMA- healed well  ecoli and enterococcus in  culture in sept when she had TMA   CAD s/p CABG   H/o bladder ca s/p left nephrectomy  Solitary kidney- avoid nephrotoxic drugs   AKI-  resolved   Discussed the management with the patient and her sister

## 2023-07-02 NOTE — Progress Notes (Signed)
PROGRESS NOTE    Laura Reed   WGN:[id number] DOB: December 27, 1960  DOA: 06/21/2023 Date of Service: 07/02/23 which is hospital day 11  PCP: Sherlene Shams, MD    HPI: 62 y.o female with significant PMH of HTN, HLD, DM, sCHF, CAD, PAD (s/p of left common femoral-distal bypass, thrombectomy, angioplasty and stent placement 04/2023 on aspirin, Plavix and Eliquis), anxiety, bladder cancer (s/p of nephrectomy). [facility] on 06/21/23 with complaints of right groin pain, redness and oozing post op. Patient has hx of PAD and recently underwent left common femoral-distal bypass, thrombectomy, angioplasty and stent placement 04/2023 and right transmetatarsal amputation. SBP in the 50s and was sent to the ED for further evaluation.   Hospital course / significant events:  12/16: admitted to hospitalist service later transferred to ICU for pressor support.  12/18: to OR, 200 cc pus drained from L groin. ID consulted.  12/19: remains stable off pressors 12/20: to OR, left groin debridement, muscle flap, wound VAC exchange  12/23: wound vac exchange. Wound appears appropriate and flap appears viable. On Eliquis, ASA. Still holding Plavix and can probably resume once confirm no further plans for OR.  12/24-12/25: stable, planning repeat wound vac change 12/26 12/26: irrigation/debridement and vac change today.    Consultants:  [facility] Vascular Surgery  Infectious disease   Procedures/Surgeries: 06/24/23: Excisional debridement of left groin abscess. Rotation of a sartorius muscle flap for coverage of the existing bypass grafts. Placement of a nondisposable wound VAC dressing left groin. Redo vascular surgery - Drs. [name] and Dew 06/26/23: Excisional debridement left groin wound. Placement of a nondisposable VAC dressing - Dr Gilda Crease 06/29/23: Left groin VAC dressing change - Dr Gilda Crease   07/02/23: Irrigation and debridement of skin, soft tissue, and muscle for approximately 125 cm to the left  groin wound, Pulse lavage irrigation left groin. Left groin VAC dressing change - Dr Wyn Quaker       ASSESSMENT & PLAN:   Left groin abscess Sepsis with septic shock resolved Status post I&D with vascular surgery 12/18 with takeback to the OR for left groin debridement, muscle flap, wound VAC exchange 12/20, wound vac exchange 12/23 continue antimicrobials per ID, currently on Unasyn Pain control Vascular following, likely for another wound vac exchange in the OR    PAD s/p of left common femoral-tibioperoneal trunk bypass, thrombectomy, angioplasty and stent placement 04/2023  on aspirin, Plavix and Eliquis  Continue eliquis and aspirin Continue statin plavix is being held. Can likely resume once no further plans for OR    Leukocytosis  In the setting of her infection and multiple trips to the OR.   Monitor CBC Per ID consider transition to po abx once WBC improving    Normocytic anemia Expected ABLA d/t surgeries normal hemoglobin prior to vascular procedures.   some blood tinge output in her wound VAC S/p 1 unit of packed red blood cells by vascular team.  Can discharge on iron supplemntation  Continue to monitor CBC   AKI  Likely d/t septic shock resolved Monitor BMP   PVC Patient noted to have frequent PVCs on tele This is likely causing her heart rate to appear to be lower than normal.  Will ensure potassium >4, Magnesium>2 Continue to monitor   Hyponatremia  Resolved monitor with improving PO intake.    Chronic diastolic HF Edema distal to LLE wound Monitor fluid status  Diuresis prn, pt would like to hold off on "fluid pills" for now    Hypoalbuminemia  Encourage po intake   HTN  HLD Continue statin Hold home antihypertensives. Mostly normotensive off of midodrine Continue to monitor blood pressure   T2DM  A1c 8 Continue long-acting 15 units Continue mealtime 3 units 3 times daily      DVT prophylaxis: ELiquis  IV fluids: no continuous IV  fluids  Nutrition: carb modified  Central lines / invasive devices: wound vac   Code Status: FULL CODE ACP documentation reviewed:  HCPOA and living will are on file in [facility]  [facility] needs: home health  Barriers to dispo / significant pending items: wound vac in place, iv abx, plannign wound vac change in OR              Subjective / Brief ROS:  Patient reports doing well today Denies CP/SOB.  Pain controlled.  Denies new weakness.  Tolerating diet.  Reports no concerns w/ urination/defecation.   Family Communication: none at this time     Objective Findings:  Vitals:   07/02/23 0712 07/02/23 0818 07/02/23 0830 07/02/23 0907  BP: 138/60 (!) 123/52 (!) 130/57 126/66  Pulse: 75 91 88 89  Resp: 17 12 12 19   Temp: 97.9 F (36.6 C) 98 F (36.7 C)  98.2 F (36.8 C)  TempSrc: Temporal   Oral  SpO2: 96% 97% 100% 96%  Weight: 73.2 kg     Height: 5\' 7"  (1.702 m)       Intake/Output Summary (Last 24 hours) at 07/02/2023 1356 Last data filed at 07/02/2023 0809 Gross per 24 hour  Intake 400 ml  Output 5 ml  Net 395 ml   Filed Weights   07/01/23 0500 07/02/23 0639 07/02/23 0712  Weight: 74.7 kg 73.2 kg 73.2 kg    Examination:  Physical Exam Constitutional:      General: She is not in acute distress. Cardiovascular:     Rate and Rhythm: Normal rate and regular rhythm.  Pulmonary:     Effort: Pulmonary effort is normal.     Breath sounds: Normal breath sounds.  Neurological:     General: No focal deficit present.     Mental Status: She is alert and oriented to person, place, and time. Mental status is at baseline.  Psychiatric:        Mood and Affect: Mood normal.        Behavior: Behavior normal.          Scheduled Medications:   apixaban  2.5 mg Oral BID   aspirin  81 mg Oral QHS   atorvastatin  20 mg Oral Daily   Chlorhexidine Gluconate Cloth  6 each Topical Daily   cholecalciferol  2,000 Units Oral Daily   gabapentin  300 mg Oral BID    insulin aspart  0-15 Units Subcutaneous TID AC & HS   insulin aspart  3 Units Subcutaneous TID WC   insulin glargine-yfgn  15 Units Subcutaneous QHS   multivitamin with minerals  1 tablet Oral Daily   pantoprazole (PROTONIX) IV  40 mg Intravenous Q24H   polyethylene glycol  17 g Oral Daily   senna  1 tablet Oral Daily    Continuous Infusions:  ampicillin-sulbactam (UNASYN) IV 3 g (07/02/23 1145)    PRN Medications:  acetaminophen, ALPRAZolam, alum & mag hydroxide-simeth, fentaNYL (SUBLIMAZE) injection, ondansetron (ZOFRAN) IV, oxyCODONE-acetaminophen  Antimicrobials from admission:  Anti-infectives (From admission, onward)    Start     Dose/Rate Route Frequency Ordered Stop   06/29/23 0620  ceFAZolin (ANCEF) IVPB 2g/100 mL premix  2 g 200 mL/hr over 30 Minutes Intravenous 30 min pre-op 06/29/23 0620 06/29/23 0747   06/26/23 1800  Ampicillin-Sulbactam (UNASYN) 3 g in sodium chloride 0.9 % 100 mL IVPB        3 g 200 mL/hr over 30 Minutes Intravenous Every 6 hours 06/26/23 1646     06/24/23 1322  gentamicin (GARAMYCIN) injection  Status:  Discontinued          As needed 06/24/23 1322 06/24/23 1347   06/24/23 1303  tobramycin (NEBCIN) powder  Status:  Discontinued          As needed 06/24/23 1303 06/24/23 1347   06/24/23 1303  vancomycin (VANCOCIN) powder  Status:  Discontinued          As needed 06/24/23 1304 06/24/23 1347   06/24/23 1200  vancomycin (VANCOREADY) IVPB 1250 mg/250 mL  Status:  Discontinued       Placed in "Followed by" Linked Group   1,250 mg 166.7 mL/hr over 90 Minutes Intravenous Every 24 hours 06/23/23 1244 06/23/23 1651   06/24/23 1000  DAPTOmycin (CUBICIN) IVPB 500 mg/38mL premix  Status:  Discontinued        8 mg/kg  64.1 kg 100 mL/hr over 30 Minutes Intravenous Daily 06/23/23 1651 06/26/23 1646   06/24/23 0234  ceFAZolin (ANCEF) IVPB 2g/100 mL premix        2 g 200 mL/hr over 30 Minutes Intravenous 30 min pre-op 06/24/23 0234 06/24/23 1300    06/23/23 1800  ceFEPIme (MAXIPIME) 2 g in sodium chloride 0.9 % 100 mL IVPB  Status:  Discontinued        2 g 200 mL/hr over 30 Minutes Intravenous Every 8 hours 06/23/23 1242 06/26/23 1646   06/23/23 1330  vancomycin (VANCOREADY) IVPB 1250 mg/250 mL  Status:  Discontinued       Placed in "Followed by" Linked Group   1,250 mg 166.7 mL/hr over 90 Minutes Intravenous Every 24 hours 06/23/23 1242 06/23/23 1244   06/23/23 1201  vancomycin (VANCOCIN) IVPB 1000 mg/200 mL premix  Status:  Discontinued       Placed in "Followed by" Linked Group   1,000 mg 200 mL/hr over 60 Minutes Intravenous Every 24 hours 06/22/23 0042 06/23/23 1242   06/22/23 2200  ceFEPIme (MAXIPIME) 2 g in sodium chloride 0.9 % 100 mL IVPB  Status:  Discontinued        2 g 200 mL/hr over 30 Minutes Intravenous Every 12 hours 06/22/23 2120 06/23/23 1242   06/22/23 0200  piperacillin-tazobactam (ZOSYN) IVPB 3.375 g  Status:  Discontinued        3.375 g 12.5 mL/hr over 240 Minutes Intravenous Every 8 hours 06/22/23 0042 06/22/23 2117   06/22/23 0045  vancomycin (VANCOCIN) IVPB 1000 mg/200 mL premix       Placed in "Followed by" Linked Group   1,000 mg 200 mL/hr over 60 Minutes Intravenous STAT 06/22/23 0042 06/22/23 0254           Data Reviewed:  I have personally reviewed the following...  CBC: Recent Labs  Lab 06/27/23 0802 06/27/23 1608 06/28/23 0339 06/29/23 0359 06/30/23 0546  WBC 21.9* 30.2* 24.7* 18.3* 15.2*  HGB 10.2* 10.2* 9.7* 8.4* 8.5*  HCT 30.1* 29.9* 28.1* 24.3* 24.7*  MCV 88.0 86.7 85.2 87.7 87.0  PLT 368 420* 422* 348 383   Basic Metabolic Panel: Recent Labs  Lab 06/26/23 0419 06/27/23 0621 06/27/23 0802 06/27/23 1349 06/28/23 0339 06/28/23 0340 06/29/23 0359 06/30/23 0546  NA 137 QUESTIONABLE  RESULTS, RECOMMEND RECOLLECT TO VERIFY 135 134*  --  134* 135 134*  K 4.4 QUESTIONABLE RESULTS, RECOMMEND RECOLLECT TO VERIFY 5.0 4.7  --  3.9 4.2 4.0  CL 111 QUESTIONABLE RESULTS, RECOMMEND  RECOLLECT TO VERIFY 109 108  --  107 105 103  CO2 21* QUESTIONABLE RESULTS, RECOMMEND RECOLLECT TO VERIFY 22 19*  --  22 22 24   GLUCOSE 137* QUESTIONABLE RESULTS, RECOMMEND RECOLLECT TO VERIFY 162* 163*  --  79 174* 141*  BUN 19 QUESTIONABLE RESULTS, RECOMMEND RECOLLECT TO VERIFY 21 24*  --  22 22 19   CREATININE 0.74 0.75 0.76 0.79  --  0.73 0.77 0.73  CALCIUM 8.1* QUESTIONABLE RESULTS, RECOMMEND RECOLLECT TO VERIFY 8.6* 8.6*  --  8.4* 8.0* 7.9*  MG 1.9  --   --   --  2.0  --   --   --   PHOS 2.4* QUESTIONABLE RESULTS, RECOMMEND RECOLLECT TO VERIFY  --   --  2.9 2.9 3.2  --    GFR: Estimated Creatinine Clearance: 69.1 mL/min (by C-G formula based on SCr of 0.73 mg/dL). Liver Function Tests: Recent Labs  Lab 06/26/23 0419 06/27/23 0621 06/28/23 0340 06/29/23 0359  ALBUMIN 1.8* QUESTIONABLE RESULTS, RECOMMEND RECOLLECT TO VERIFY 2.0* 1.8*   No results for input(s): "LIPASE", "AMYLASE" in the last 168 hours. No results for input(s): "AMMONIA" in the last 168 hours. Coagulation Profile: No results for input(s): "INR", "PROTIME" in the last 168 hours. Cardiac Enzymes: No results for input(s): "CKTOTAL", "CKMB", "CKMBINDEX", "TROPONINI" in the last 168 hours.  BNP (last 3 results) No results for input(s): "PROBNP" in the last 8760 hours. HbA1C: No results for input(s): "HGBA1C" in the last 72 hours. CBG: Recent Labs  Lab 07/01/23 2112 07/02/23 0704 07/02/23 0821 07/02/23 0910 07/02/23 1200  GLUCAP 172* 101* 108* 114* 146*   Lipid Profile: No results for input(s): "CHOL", "HDL", "LDLCALC", "TRIG", "CHOLHDL", "LDLDIRECT" in the last 72 hours. Thyroid Function Tests: No results for input(s): "TSH", "T4TOTAL", "FREET4", "T3FREE", "THYROIDAB" in the last 72 hours. Anemia Panel: Recent Labs    06/30/23 0545  VITAMINB12 550   Most Recent Urinalysis On File:     Component Value Date/Time   COLORURINE YELLOW (A) 05/11/2023 1317   APPEARANCEUR TURBID (A) 05/11/2023 1317    APPEARANCEUR Hazy (A) 08/12/2022 1430   LABSPEC 1.019 05/11/2023 1317   LABSPEC 1.010 01/09/2014 1108   PHURINE 5.0 05/11/2023 1317   GLUCOSEU NEGATIVE 05/11/2023 1317   GLUCOSEU Negative 01/09/2014 1108   HGBUR LARGE (A) 05/11/2023 1317   BILIRUBINUR NEGATIVE 05/11/2023 1317   BILIRUBINUR Negative 08/12/2022 1430   BILIRUBINUR Negative 01/09/2014 1108   KETONESUR 5 (A) 05/11/2023 1317   PROTEINUR 100 (A) 05/11/2023 1317   UROBILINOGEN 0.2 04/09/2016 1159   NITRITE POSITIVE (A) 05/11/2023 1317   LEUKOCYTESUR MODERATE (A) 05/11/2023 1317   LEUKOCYTESUR 3+ 01/09/2014 1108   Sepsis Labs: @LABRCNTIP (procalcitonin:4,lacticidven:4) Microbiology: Recent Results (from the past 240 hours)  MRSA Next Gen by PCR, Nasal     Status: None   Collection Time: 06/23/23  6:45 PM   Specimen: Nasal Mucosa; Nasal Swab  Result Value Ref Range Status   MRSA by PCR Next Gen NOT DETECTED NOT DETECTED Final    Comment: (NOTE) The GeneXpert MRSA Assay (FDA approved for NASAL specimens only), is one component of a comprehensive MRSA colonization surveillance program. It is not intended to diagnose MRSA infection nor to guide or monitor treatment for MRSA infections. Test performance is not FDA approved in  patients less than 27 years old. Performed at [facility], [address] [place], [place] [address]   Culture, blood (Routine X 2) w Reflex to ID Panel     Status: None   Collection Time: 06/23/23  7:18 PM   Specimen: BLOOD  Result Value Ref Range Status   Specimen Description BLOOD RIGHT ANTECUBITAL  Final   Special Requests   Final    BOTTLES DRAWN AEROBIC AND ANAEROBIC Blood Culture adequate volume   Culture   Final    NO GROWTH 5 DAYS Performed at [facility], [address], [place], [place] [address]    Report Status 06/28/2023 FINAL  Final  Aerobic/Anaerobic Culture w Gram Stain (surgical/deep wound)     Status: None   Collection Time: 06/24/23  6:42 AM    Specimen: Path fluid; Body Fluid  Result Value Ref Range Status   Specimen Description WOUND  Final   Special Requests LEFT GROIN  Final   Gram Stain NO WBC SEEN NO ORGANISMS SEEN   Final   Culture   Final    FEW ESCHERICHIA COLI NO ANAEROBES ISOLATED Performed at [facility], [address], [place], [place] [address]    Report Status 06/29/2023 FINAL  Final   Organism ID, Bacteria ESCHERICHIA COLI  Final      Susceptibility   Escherichia coli - MIC*    AMPICILLIN <=2 SENSITIVE Sensitive     CEFEPIME <=0.12 SENSITIVE Sensitive     CEFTAZIDIME <=1 SENSITIVE Sensitive     CEFTRIAXONE <=0.25 SENSITIVE Sensitive     CIPROFLOXACIN <=0.25 SENSITIVE Sensitive     GENTAMICIN <=1 SENSITIVE Sensitive     IMIPENEM <=0.25 SENSITIVE Sensitive     TRIMETH/SULFA <=20 SENSITIVE Sensitive     AMPICILLIN/SULBACTAM <=2 SENSITIVE Sensitive     PIP/TAZO <=4 SENSITIVE Sensitive ug/mL    * FEW ESCHERICHIA COLI  Aerobic/Anaerobic Culture w Gram Stain (surgical/deep wound)     Status: None   Collection Time: 06/24/23 12:55 PM   Specimen: Path Tissue  Result Value Ref Range Status   Specimen Description WOUND  Final   Special Requests LEFT GROIN  Final   Gram Stain NO WBC SEEN NO ORGANISMS SEEN   Final   Culture   Final    FEW ESCHERICHIA COLI SUSCEPTIBILITIES PERFORMED ON PREVIOUS CULTURE WITHIN THE LAST 5 DAYS. NO ANAEROBES ISOLATED Performed at [facility], [address] [place], [place] [address]    Report Status 06/29/2023 FINAL  Final      Radiology Studies last 3 days: No results found.      Sunnie Nielsen, DO [facility] 07/02/2023, 1:56 PM    Dictation software may have been used to generate the above note. Typos may occur and escape review in typed/dictated notes. Please contact Dr Lyn Hollingshead directly for clarity if needed.  Staff may message me via secure chat in Epic  but this may not receive an immediate response,  please page me for urgent  matters!  If 7PM-7AM, please contact night coverage [email or web address]

## 2023-07-02 NOTE — Anesthesia Postprocedure Evaluation (Signed)
Anesthesia Post Note  Patient: Laura Reed  Procedure(s) Performed: WOUND VAC EXCHANGE LEFT GROIN (Left)  Patient location during evaluation: PACU Anesthesia Type: General Level of consciousness: awake Pain management: pain level controlled Vital Signs Assessment: post-procedure vital signs reviewed and stable Respiratory status: spontaneous breathing Cardiovascular status: stable Anesthetic complications: no   No notable events documented.   Last Vitals:  Vitals:   07/02/23 0712 07/02/23 0818  BP: 138/60 (!) 123/52  Pulse: 75 91  Resp: 17 12  Temp: 36.6 C 36.7 C  SpO2: 96% 97%    Last Pain:  Vitals:   07/02/23 0818  TempSrc:   PainSc: Asleep                 VAN STAVEREN,Luisalberto Beegle

## 2023-07-02 NOTE — Anesthesia Preprocedure Evaluation (Signed)
Anesthesia Evaluation  Patient identified by MRN, date of birth, ID band Patient awake    Reviewed: Allergy & Precautions, NPO status , Patient's Chart, lab work & pertinent test results  Airway Mallampati: II  TM Distance: >3 FB Neck ROM: full    Dental  (+) Upper Dentures, Lower Dentures   Pulmonary neg pulmonary ROS, Patient abstained from smoking., former smoker   Pulmonary exam normal  + decreased breath sounds      Cardiovascular Exercise Tolerance: Poor hypertension, Pt. on medications + CAD, + Peripheral Vascular Disease and +CHF  negative cardio ROS Normal cardiovascular exam+ dysrhythmias Atrial Fibrillation  Rhythm:Irregular Rate:Tachycardia     Neuro/Psych   Anxiety     negative neurological ROS  negative psych ROS   GI/Hepatic negative GI ROS, Neg liver ROS,GERD  Medicated,,  Endo/Other  negative endocrine ROSdiabetes, Type 1    Renal/GU Renal diseasenegative Renal ROS  negative genitourinary   Musculoskeletal  (+) Arthritis ,    Abdominal Normal abdominal exam  (+)   Peds negative pediatric ROS (+)  Hematology negative hematology ROS (+) Blood dyscrasia, anemia   Anesthesia Other Findings Past Medical History: No date: Absence of kidney     Comment:  left No date: Anxiety No date: Arthritis No date: Atherosclerosis of native arteries of extremities with  intermittent claudication, bilateral legs (HCC) No date: Bladder cancer (HCC) No date: CHF (congestive heart failure) (HCC) No date: Complication of anesthesia     Comment:  BP HAS  RUN LOW AFTER SURGERY-LUNGS FILLED UP WITH FLUID              AFTER  LEG STENT SURGERY  No date: Coronary artery disease No date: Diabetes mellitus No date: Family history of adverse reaction to anesthesia     Comment:  Sister - PONV No date: GERD (gastroesophageal reflux disease)     Comment:  OCC TUMS No date: Heart murmur No date: Hemorrhoid 2007: History  of methicillin resistant staphylococcus aureus (MRSA) No date: Hypertension No date: Neuropathy No date: PVD (peripheral vascular disease) (HCC) No date: Thyroid nodule     Comment:  right No date: Unspecified osteoarthritis, unspecified site 10/31/2014: Urothelial carcinoma of kidney (HCC)     Comment:  INVASIVE UROTHELIAL CARCINOMA, LOW GRADE. T1, Nx. No date: Vitamin D deficiency, unspecified No date: Wears dentures     Comment:  full upper and lower  Past Surgical History: No date: AMPUTATION TOE     Comment:  right (4th and 5th); left (great toe, 3rd) 07/16/2018: AMPUTATION TOE; Right     Comment:  Procedure: AMPUTATION TOE/MPJ right 2nd;  Surgeon:               Linus Galas, DPM;  Location: [facility];  Service:               Podiatry;  Laterality: Right; 06/26/2023: APPLICATION OF WOUND VAC; Left     Comment:  Procedure: WOUND VAC EXCHANGE LEFT GROIN;  Surgeon:               Renford Dills, MD;  Location: [facility];  Service:               Vascular;  Laterality: Left; 06/29/2023: APPLICATION OF WOUND VAC; Left     Comment:  Procedure: WOUND VAC EXCHANGE LEFT GROIN;  Surgeon:               Renford Dills, MD;  Location: [facility];  Service:  Vascular;  Laterality: Left; 2009, 2013 x 2: ARTERIAL BYPASS SURGRY     Comment:  right leg , done in [place] No date: CARDIAC CATHETERIZATION 01/2014: CAROTID ENDARTERECTOMY; Right     Comment:  Dr Gilda Crease 12/14/2014: CATARACT EXTRACTION W/PHACO; Right     Comment:  Procedure: CATARACT EXTRACTION PHACO AND INTRAOCULAR               LENS PLACEMENT (IOC);  Surgeon: Lia Hopping, MD;                Location: [facility];  Service: Ophthalmology;  Laterality:              Right;  [place]   00:38.6              AP        7.1                        CDE  2.76 12/06/2019: CATARACT EXTRACTION W/PHACO; Left     Comment:  Procedure: CATARACT EXTRACTION PHACO AND INTRAOCULAR               LENS PLACEMENT (IOC) LEFT DIABETIC;  Surgeon:  Galen Manila, MD;  Location: [facility];  Service:               Ophthalmology;  Laterality: Left;  9.08 1:06.4 No date: CESAREAN SECTION 03-03-12: CHOLECYSTECTOMY     Comment:  Porcelain gallbladder, gallstones,  [place] 04/28/2012: COLONOSCOPY W/ BIOPSIES     Comment:  Hyperplastic rectal polyps. 04/02/2022: COLONOSCOPY WITH PROPOFOL; N/A     Comment:  Procedure: COLONOSCOPY WITH PROPOFOL;  Surgeon: Earline Mayotte, MD;  Location: [facility];  Service:               Endoscopy;  Laterality: N/A; 2009: CORONARY ARTERY BYPASS GRAFT     Comment:  3 vessel 09/01/2016: CYSTOSCOPY W/ RETROGRADES; Right     Comment:  Procedure: CYSTOSCOPY WITH RETROGRADE PYELOGRAM;                Surgeon: Vanna Scotland, MD;  Location: [facility];                Service: Urology;  Laterality: Right; 03/19/2020: CYSTOSCOPY W/ RETROGRADES; Bilateral     Comment:  Procedure: CYSTOSCOPY WITH RETROGRADE PYELOGRAM;                Surgeon: Vanna Scotland, MD;  Location: [facility];                Service: Urology;  Laterality: Bilateral; 03/19/2020: CYSTOSCOPY WITH BIOPSY; N/A     Comment:  Procedure: CYSTOSCOPY WITH BIOPSY;  Surgeon: Vanna Scotland, MD;  Location: [facility];  Service: Urology;                Laterality: N/A; No date: EYE SURGERY 04/29/2023: FEMORAL-POPLITEAL BYPASS GRAFT; Left     Comment:  Procedure: BYPASS GRAFT FEMORAL-POPLITEAL ARTERY;                Surgeon: Renford Dills, MD;  Location: [facility];                Service: Vascular;  Laterality: Left; 10-31-14: HERNIA REPAIR  Comment:  ventral, retro-rectus atrium mesh 06/24/2023: INCISION AND DRAINAGE ABSCESS; Left     Comment:  Procedure: INCISION AND DRAINAGE WITH SATORIUS FLAP;                Surgeon: Renford Dills, MD;  Location: [facility];                Service: Vascular;  Laterality: Left; 01/18/2019: IRRIGATION AND DEBRIDEMENT FOOT; Left     Comment:  Procedure:  IRRIGATION AND DEBRIDEMENT FOOT;  Surgeon:               Linus Galas, DPM;  Location: [facility];  Service:               Podiatry;  Laterality: Left; 12/10/2016: LOWER EXTREMITY ANGIOGRAPHY; Left     Comment:  Procedure: Lower Extremity Angiography;  Surgeon:               Renford Dills, MD;  Location: [facility];               Service: Cardiovascular;  Laterality: Left; 02/02/2018: LOWER EXTREMITY ANGIOGRAPHY; Left     Comment:  Procedure: LOWER EXTREMITY ANGIOGRAPHY;  Surgeon:               Renford Dills, MD;  Location: [facility];               Service: Cardiovascular;  Laterality: Left; 05/05/2018: LOWER EXTREMITY ANGIOGRAPHY; Left     Comment:  Procedure: LOWER EXTREMITY ANGIOGRAPHY;  Surgeon:               Renford Dills, MD;  Location: [facility];               Service: Cardiovascular;  Laterality: Left; 12/04/2020: LOWER EXTREMITY ANGIOGRAPHY; Left     Comment:  Procedure: LOWER EXTREMITY ANGIOGRAPHY with               Intervention;  Surgeon: Renford Dills, MD;                Location: [facility];  Service: Cardiovascular;              Laterality: Left; 04/24/2023: LOWER EXTREMITY ANGIOGRAPHY; Left     Comment:  Procedure: Lower Extremity Angiography;  Surgeon:               Renford Dills, MD;  Location: [facility];               Service: Cardiovascular;  Laterality: Left; 05/01/2023: LOWER EXTREMITY ANGIOGRAPHY; Left     Comment:  Procedure: Lower Extremity Angiography;  Surgeon:               Renford Dills, MD;  Location: [facility];               Service: Cardiovascular;  Laterality: Left; 10-31-14: NEPHRECTOMY; Left 05/01/2015: PERIPHERAL VASCULAR CATHETERIZATION; Left     Comment:  Procedure: Lower Extremity Angiography;  Surgeon:               Renford Dills, MD;  Location: [facility];                Service: Cardiovascular;  Laterality: Left; 05/01/2015: PERIPHERAL VASCULAR  CATHETERIZATION     Comment:  Procedure: Lower Extremity Intervention;  Surgeon:  Renford Dills, MD;  Location: [facility];                Service: Cardiovascular;; 02/20/2015: PERIPHERAL VASCULAR CATHETERIZATION; Left     Comment:  Procedure: Pelvic Angiography;  Surgeon: Renford Dills, MD;  Location: [facility];  Service:               Cardiovascular;  Laterality: Left; 05/05/2023: TRANSMETATARSAL AMPUTATION; Left     Comment:  Procedure: TRANSMETATARSAL AMPUTATION LEFT;  Surgeon:               Gwyneth Revels, DPM;  Location: [facility];  Service:               Orthopedics/Podiatry;  Laterality: Left; 09/01/2016: TRANSURETHRAL RESECTION OF BLADDER TUMOR WITH MITOMYCIN-C;  N/A     Comment:  Procedure: TRANSURETHRAL RESECTION OF BLADDER TUMOR WITH              MITOMYCIN-C;  Surgeon: Vanna Scotland, MD;  Location:               [facility];  Service: Urology;  Laterality: N/A;  BMI    Body Mass Index: 25.28 kg/m      Reproductive/Obstetrics negative OB ROS                             Anesthesia Physical Anesthesia Plan  ASA: 3  Anesthesia Plan: General   Post-op Pain Management:    Induction: Intravenous  PONV Risk Score and Plan: Propofol infusion and TIVA  Airway Management Planned: Natural Airway and Nasal Cannula  Additional Equipment:   Intra-op Plan:   Post-operative Plan:   Informed Consent: I have reviewed the patients History and Physical, chart, labs and discussed the procedure including the risks, benefits and alternatives for the proposed anesthesia with the patient or authorized representative who has indicated his/her understanding and acceptance.     Dental Advisory Given  Plan Discussed with: CRNA and Surgeon  Anesthesia Plan Comments:        Anesthesia Quick Evaluation

## 2023-07-02 NOTE — Transfer of Care (Signed)
Immediate Anesthesia Transfer of Care Note  Patient: Laura Reed  Procedure(s) Performed: WOUND VAC EXCHANGE LEFT GROIN (Left)  Patient Location: PACU  Anesthesia Type:General  Level of Consciousness: awake and alert   Airway & Oxygen Therapy: Patient Spontanous Breathing and Patient connected to nasal cannula oxygen  Post-op Assessment: Report given to RN and Post -op Vital signs reviewed and stable  Post vital signs: Reviewed and stable  Last Vitals:  Vitals Value Taken Time  BP 123/52 07/02/23 0818  Temp    Pulse 88 07/02/23 0819  Resp 12 07/02/23 0819  SpO2 98 % 07/02/23 0819  Vitals shown include unfiled device data.  Last Pain:  Vitals:   07/02/23 1017  TempSrc: Temporal  PainSc: 2       Patients Stated Pain Goal: 0 (07/01/23 2122)  Complications: No notable events documented.

## 2023-07-02 NOTE — Interval H&P Note (Signed)
History and Physical Interval Note:  07/02/2023 7:27 AM  Laura Reed  has presented today for surgery, with the diagnosis of left groin infection.  The various methods of treatment have been discussed with the patient and family. After consideration of risks, benefits and other options for treatment, the patient has consented to  Procedure(s): WOUND VAC EXCHANGE LEFT GROIN (Left) as a surgical intervention.  The patient's history has been reviewed, patient examined, no change in status, stable for surgery.  I have reviewed the patient's chart and labs.  Questions were answered to the patient's satisfaction.     [name]

## 2023-07-02 NOTE — Anesthesia Procedure Notes (Signed)
Date/Time: 07/02/2023 7:43 AM  Performed by: Malva Cogan, CRNAPre-anesthesia Checklist: Patient identified, Emergency Drugs available, Suction available, Patient being monitored and Timeout performed Patient Re-evaluated:Patient Re-evaluated prior to induction Oxygen Delivery Method: Nasal cannula Induction Type: IV induction Placement Confirmation: CO2 detector and positive ETCO2

## 2023-07-03 ENCOUNTER — Encounter: Payer: Self-pay | Admitting: Vascular Surgery

## 2023-07-03 DIAGNOSIS — D72829 Elevated white blood cell count, unspecified: Secondary | ICD-10-CM | POA: Diagnosis not present

## 2023-07-03 DIAGNOSIS — I739 Peripheral vascular disease, unspecified: Secondary | ICD-10-CM | POA: Diagnosis not present

## 2023-07-03 DIAGNOSIS — L02214 Cutaneous abscess of groin: Secondary | ICD-10-CM | POA: Diagnosis not present

## 2023-07-03 DIAGNOSIS — T827XXA Infection and inflammatory reaction due to other cardiac and vascular devices, implants and grafts, initial encounter: Secondary | ICD-10-CM | POA: Diagnosis not present

## 2023-07-03 LAB — BASIC METABOLIC PANEL
Anion gap: 11 (ref 5–15)
BUN: 21 mg/dL (ref 8–23)
CO2: 25 mmol/L (ref 22–32)
Calcium: 7.9 mg/dL — ABNORMAL LOW (ref 8.9–10.3)
Chloride: 100 mmol/L (ref 98–111)
Creatinine, Ser: 0.73 mg/dL (ref 0.44–1.00)
GFR, Estimated: >60 mL/min (ref >60)
Glucose, Bld: 150 mg/dL — ABNORMAL HIGH (ref 70–99)
Potassium: 4.4 mmol/L (ref 3.5–5.1)
Sodium: 136 mmol/L (ref 135–145)

## 2023-07-03 LAB — CBC
HCT: 27.7 % — ABNORMAL LOW (ref 36.0–46.0)
Hemoglobin: 9 g/dL — ABNORMAL LOW (ref 12.0–15.0)
MCH: 29.3 pg (ref 26.0–34.0)
MCHC: 32.5 g/dL (ref 30.0–36.0)
MCV: 90.2 fL (ref 80.0–100.0)
Platelets: 434 10*3/uL — ABNORMAL HIGH (ref 150–400)
RBC: 3.07 MIL/uL — ABNORMAL LOW (ref 3.87–5.11)
RDW: 14.4 % (ref 11.5–15.5)
WBC: 12.4 10*3/uL — ABNORMAL HIGH (ref 4.0–10.5)
nRBC: 0 % (ref 0.0–0.2)

## 2023-07-03 LAB — GLUCOSE, CAPILLARY
Glucose-Capillary: 142 mg/dL — ABNORMAL HIGH (ref 70–99)
Glucose-Capillary: 147 mg/dL — ABNORMAL HIGH (ref 70–99)
Glucose-Capillary: 77 mg/dL (ref 70–99)
Glucose-Capillary: 126 mg/dL — ABNORMAL HIGH (ref 70–99)

## 2023-07-03 MED ORDER — PANTOPRAZOLE SODIUM 40 MG PO TBEC
40.0000 mg | DELAYED_RELEASE_TABLET | Freq: Every day | ORAL | Status: DC
  Administered 2023-07-04 – 2023-07-24 (×21): 40 mg via ORAL
  Filled 2023-07-03 (×21): qty 1

## 2023-07-03 MED ORDER — ORAL CARE MOUTH RINSE: 15.0000 mL | OROMUCOSAL | Status: DC | PRN

## 2023-07-03 MED ORDER — FUROSEMIDE 10 MG/ML IJ SOLN
40.0000 mg | Freq: Once | INTRAMUSCULAR | Status: AC
  Administered 2023-07-03: 40 mg via INTRAVENOUS
  Filled 2023-07-03: qty 4

## 2023-07-03 NOTE — TOC Progression Note (Addendum)
Transition of Care [facility]) - Progression Note    Patient Details  Name: Laura Reed MRN: [id number] Date of Birth: 12-06-1960  Transition of [facility]) CM/SW Contact  Marlowe Sax, RN Phone Number: 07/03/2023, 1:56 PM  Clinical Narrative:    Reached out to Pam with [facility] for IV Infusion to arrange Home IV ABX to go thru 1/15 Reached out to [place] at [facility] to confirm if they are going to be able to accept the patient for RN and PT, will likley need wound Vac as well  [facility] stated that they can not manage the Wound vac with their staffing right now, sent the referral to [facility], awaiting a response       Expected Discharge Plan and Services                                               Social Determinants of Health (SDOH) Interventions SDOH Screenings   Food Insecurity: No Food Insecurity (06/23/2023)  Recent Concern: Food Insecurity - Food Insecurity Present (05/26/2023)   Received from [facility]  Housing: Low Risk  (06/23/2023)  Transportation Needs: No Transportation Needs (06/23/2023)  Utilities: Not At Risk (06/23/2023)  Alcohol Screen: Low Risk  (06/17/2023)  Depression (PHQ2-9): Low Risk  (06/17/2023)  Financial Resource Strain: Low Risk  (06/21/2023)   Received from [facility]  Physical Activity: Inactive (06/17/2023)  Social Connections: Socially Isolated (06/17/2023)  Stress: Stress Concern Present (06/17/2023)  Tobacco Use: Medium Risk (06/29/2023)  Health Literacy: Inadequate Health Literacy (06/17/2023)    Readmission Risk Interventions    06/22/2023   11:01 AM  Readmission Risk Prevention Plan  Transportation Screening Complete  PCP or Specialist Appt within 3-5 Days Complete  Social Work Consult for Recovery Care Planning/Counseling Complete  Palliative Care Screening Not Applicable  Medication Review Oceanographer) Complete

## 2023-07-03 NOTE — Progress Notes (Signed)
Progress Note    07/03/2023 2:56 PM 1 Day Post-Op  Subjective:  Laura Reed is a 62 year old female now status postop day 1 from wound VAC washout and change in the operating room. Wound VAC working well with noted 30 cc of serous drainage to the canister. No complaints overnight. Patient continues to work with physical therapy. Vitals are remained stable.    Vitals:   07/02/23 2333 07/03/23 0831  BP: (!) 123/45 (!) 124/55  Pulse: 67 68  Resp: 20 16  Temp: 98.1 F (36.7 C) 98.2 F (36.8 C)  SpO2: 96% 99%   Physical Exam: Cardiac:  RRR, Normal S1,S2. No murmurs appreciated.  Bradycardia resolved. Lungs: Clear on auscultation throughout but remains diminished in the bases.  Nonlabored breathing.  Nonproductive cough this morning.  Instructed to use bedside incentive spirometry. Incisions: Left groin with wound VAC in place.  Working well. Extremities: Bilateral lower extremities warm to touch.  Left lower extremity with +2 edema.  Able to palpate pulses but has positive Doppler pulses. Abdomen: Positive bowel sounds throughout, soft, nontender nondistended. Neurologic: Alert and oriented x 3, answers all questions and follows commands appropriately.  CBC    Component Value Date/Time   WBC 12.4 (H) 07/03/2023 0511   RBC 3.07 (L) 07/03/2023 0511   HGB 9.0 (L) 07/03/2023 0511   HGB 9.5 (L) 11/02/2014 0609   HCT 27.7 (L) 07/03/2023 0511   HCT 29.5 (L) 11/02/2014 0609   PLT 434 (H) 07/03/2023 0511   PLT 217 11/02/2014 0609   MCV 90.2 07/03/2023 0511   MCV 87 11/02/2014 0609   MCH 29.3 07/03/2023 0511   MCHC 32.5 07/03/2023 0511   RDW 14.4 07/03/2023 0511   RDW 13.6 11/02/2014 0609   LYMPHSABS 1.2 07/02/2023 1644   LYMPHSABS 1.2 11/02/2014 0609   MONOABS 1.0 07/02/2023 1644   MONOABS 1.1 (H) 11/02/2014 0609   EOSABS 0.2 07/02/2023 1644   EOSABS 0.3 11/02/2014 0609   BASOSABS 0.0 07/02/2023 1644   BASOSABS 0.0 11/02/2014 0609    BMET    Component Value Date/Time    NA 136 07/03/2023 0511   NA 136 08/02/2021 1629   NA 135 11/02/2014 0609   K 4.4 07/03/2023 0511   K 4.2 11/02/2014 0609   CL 100 07/03/2023 0511   CL 107 11/02/2014 0609   CO2 25 07/03/2023 0511   CO2 23 11/02/2014 0609   GLUCOSE 150 (H) 07/03/2023 0511   GLUCOSE 108 (H) 11/02/2014 0609   BUN 21 07/03/2023 0511   BUN 25 08/02/2021 1629   BUN 20 11/02/2014 0609   CREATININE 0.73 07/03/2023 0511   CREATININE 1.01 11/09/2015 1549   CREATININE 1.01 11/09/2015 1549   CALCIUM 7.9 (L) 07/03/2023 0511   CALCIUM 7.3 (L) 11/02/2014 0609   GFRNONAA >60 07/03/2023 0511   GFRNONAA 50 (L) 11/02/2014 0609   GFRAA >60 01/19/2019 0357   GFRAA 58 (L) 11/02/2014 0609    INR    Component Value Date/Time   INR 1.1 06/21/2023 2304   INR 0.9 10/17/2014 1131    No intake or output data in the 24 hours ending 07/03/23 1456   Assessment/Plan:  62 y.o. female is s/p  wound VAC washout with wound VAC change.  1 Day Post-Op   Vascular surgery plans on taking the patient back to the operating room on 07/05/2023 for another wound VAC change out and assessment of the sartorius flap in her left groin.  Patient was reminded she would be made n.p.o.  after midnight on 07/05/23.  She verbalized her understanding.  Wishes to proceed.   Dr. [name] MD is made aware of the plan and he agrees with plan.   DVT prophylaxis: Eliquis 2.5 twice daily, aspirin 81 mg daily, and Lipitor 20 daily   Marcie Bal [facility] 07/03/2023 2:56 PM

## 2023-07-03 NOTE — Progress Notes (Signed)
PHARMACIST - PHYSICIAN COMMUNICATION  DR:   Lyn Hollingshead  CONCERNING: IV to Oral Route Change Policy  RECOMMENDATION: This patient is receiving pantoprazole by the intravenous route.  Based on criteria approved by the Pharmacy and Therapeutics Committee, the intravenous medication(s) is/are being converted to the equivalent oral dose form(s).   DESCRIPTION: These criteria include: The patient is eating (either orally or via tube) and/or has been taking other orally administered medications for a least 24 hours The patient has no evidence of active gastrointestinal bleeding or impaired GI absorption (gastrectomy, short bowel, patient on TNA or NPO).  If you have questions about this conversion, please contact the Pharmacy Department    Barrie Folk, [facility] 07/03/2023 1:51 PM

## 2023-07-03 NOTE — Progress Notes (Signed)
Physical Therapy Treatment Patient Details Name: SHIAH OAKEY MRN: [id number] DOB: 02-07-1961 Today's Date: 07/03/2023   History of Present Illness RICKAYLA HODSON  has presented today for surgery, with the diagnosis of Groin Infection. S/P I & D left groin. On April 29, 2023 she underwent a femoral to tibioperoneal trunk bypass using a PTFE distal flow graft.  She was noted to thrombose within the first 48 hours and subsequently underwent angiography with salvage of her bypass and stenting of the anterior tibial artery.  Following the second procedure which was May 01, 2023 she did well and was discharged to home approximately 10 days later. Wound vac change 12/23.    PT Comments  Pt was sitting in recliner upon arrival. She is A and O + cooperative and motivated." I want to go home when I can. I don't think I need rehab this time." Pt does well throughout session. Endorses minimal 2/10 pain. Wound vac collecting a lot of fluid and care team is aware. Pt was able to safely stand and ambulate ~ 77ft with RW. Post op shoe on LLE and regular shoe on RLE. Distance limited by fatigue. Pt has stairs to enter home. Will perform stair training next session. DC recs remain appropriate. Acute PT will continue to follow.    If plan is discharge home, recommend the following: A little help with walking and/or transfers;A little help with bathing/dressing/bathroom;Assistance with cooking/housework;Help with stairs or ramp for entrance;Assist for transportation     Equipment Recommendations  Rolling walker (2 wheels)       Precautions / Restrictions Precautions Precautions: Fall Precaution Comments: wound vac groin/ post op shoe Restrictions Weight Bearing Restrictions Per Provider Order: No     Mobility  Bed Mobility  General bed mobility comments: In recliner pre/post session    Transfers Overall transfer level: Needs assistance Equipment used: Rolling walker (2 wheels) Transfers: Sit  to/from Stand Sit to Stand: Contact guard assist  General transfer comment: CGA for safetyhowever no physical assistance. tactle cues for handplacement for technique improvements    Ambulation/Gait Ambulation/Gait assistance: Contact guard assist Gait Distance (Feet): 75 Feet Assistive device: Rolling walker (2 wheels) Gait Pattern/deviations: Step-to pattern, Antalgic Gait velocity: decreased  General Gait Details: Pt was able to ambulate 75 ft with post op shoe + regular shoe on RLE   Balance Overall balance assessment: Needs assistance Sitting-balance support: Feet supported Sitting balance-Leahy Scale: Good     Standing balance support: Bilateral upper extremity supported, During functional activity, Reliant on assistive device for balance Standing balance-Leahy Scale: Fair       Cognition Arousal: Alert Behavior During Therapy: WFL for tasks assessed/performed Overall Cognitive Status: Within Functional Limits for tasks assessed    General Comments: Pt is A and O x 4. Cooperative and pleasant               Pertinent Vitals/Pain Pain Assessment Pain Assessment: 0-10 Pain Score: 2  Pain Location: L groin Pain Descriptors / Indicators: Discomfort, Sore Pain Intervention(s): Limited activity within patient's tolerance, Monitored during session, Premedicated before session, Repositioned     PT Goals (current goals can now be found in the care plan section) Acute Rehab PT Goals Patient Stated Goal: to return home Progress towards PT goals: Progressing toward goals    Frequency    Min 1X/week       AM-PAC PT "6 Clicks" Mobility   Outcome Measure  Help needed turning from your back to your side while in a  flat bed without using bedrails?: A Little Help needed moving from lying on your back to sitting on the side of a flat bed without using bedrails?: A Little Help needed moving to and from a bed to a chair (including a wheelchair)?: A Little Help needed  standing up from a chair using your arms (e.g., wheelchair or bedside chair)?: A Little Help needed to walk in hospital room?: A Little Help needed climbing 3-5 steps with a railing? : A Little 6 Click Score: 18    End of Session   Activity Tolerance: Patient tolerated treatment well;No increased pain;Patient limited by fatigue Patient left: in chair;with call bell/phone within reach;with family/visitor present Nurse Communication: Mobility status PT Visit Diagnosis: Other abnormalities of gait and mobility (R26.89);Unsteadiness on feet (R26.81);Pain;Difficulty in walking, not elsewhere classified (R26.2);Muscle weakness (generalized) (M62.81) Pain - Right/Left: Left Pain - part of body: Leg     Time: 0347-4259 PT Time Calculation (min) (ACUTE ONLY): 14 min  Charges:    $Gait Training: 8-22 mins PT General Charges $$ ACUTE PT VISIT: 1 Visit                     Jetta Lout PTA 07/03/23, 12:19 PM

## 2023-07-03 NOTE — Progress Notes (Signed)
PROGRESS NOTE    Laura Reed   UJW:[id number] DOB: Apr 02, 1961  DOA: 06/21/2023 Date of Service: 07/03/23 which is hospital day 12  PCP: Sherlene Shams, MD    HPI: 62 y.o female with significant PMH of HTN, HLD, DM, sCHF, CAD, PAD (s/p of left common femoral-distal bypass, thrombectomy, angioplasty and stent placement 04/2023 on aspirin, Plavix and Eliquis), anxiety, bladder cancer (s/p of nephrectomy). [facility] on 06/21/23 with complaints of right groin pain, redness and oozing post op. Patient has hx of PAD and recently underwent left common femoral-distal bypass, thrombectomy, angioplasty and stent placement 04/2023 and right transmetatarsal amputation. SBP in the 50s and was sent to the ED for further evaluation.   Hospital course / significant events:  12/16: admitted to hospitalist service later transferred to ICU for pressor support.  12/18: to OR, 200 cc pus drained from L groin. ID consulted.  12/19: remains stable off pressors 12/20: to OR, left groin debridement, muscle flap, wound VAC exchange  12/23: wound vac exchange. Wound appears appropriate and flap appears viable. On Eliquis, ASA. Still holding Plavix and can probably resume once confirm no further plans for OR.  12/24-12/25: stable, planning repeat wound vac change 12/26 12/26: irrigation/debridement and vac change today.  12/27: await return to OR Sunday 12/29 for wound vac exchange.    Consultants:  [facility] Vascular Surgery  Infectious disease   Procedures/Surgeries: 06/24/23: Excisional debridement of left groin abscess. Rotation of a sartorius muscle flap for coverage of the existing bypass grafts. Placement of a nondisposable wound VAC dressing left groin. Redo vascular surgery - Drs. [name] and Dew 06/26/23: Excisional debridement left groin wound. Placement of a nondisposable VAC dressing - Dr Gilda Crease 06/29/23: Left groin VAC dressing change - Dr Gilda Crease   07/02/23: Irrigation and debridement of  skin, soft tissue, and muscle for approximately 125 cm to the left groin wound, Pulse lavage irrigation left groin. Left groin VAC dressing change - Dr Wyn Quaker       ASSESSMENT & PLAN:   Left groin abscess Sepsis with septic shock resolved Status post I&D with vascular surgery 12/18 with takeback to the OR for left groin debridement, muscle flap, wound VAC exchange 12/20, wound vac exchange 12/23 continue antimicrobials per ID, currently on Unasyn Pain control Vascular following, likely for another wound vac exchange in the OR    PAD s/p of left common femoral-tibioperoneal trunk bypass, thrombectomy, angioplasty and stent placement 04/2023  on aspirin, Plavix and Eliquis  Continue eliquis and aspirin Continue statin plavix is being held. Can likely resume once no further plans for OR    Leukocytosis  In the setting of her infection and multiple trips to the OR.   Monitor CBC Per ID consider transition to po abx once WBC improving    Normocytic anemia Expected ABLA d/t surgeries normal hemoglobin prior to vascular procedures.   some blood tinge output in her wound VAC S/p 1 unit of packed red blood cells by vascular team.  Can discharge on iron supplemntation  Continue to monitor CBC   AKI  Likely d/t septic shock resolved Monitor BMP   PVC Patient noted to have frequent PVCs on tele This is likely causing her heart rate to appear to be lower than normal.  Will ensure potassium >4, Magnesium>2 Continue to monitor   Hyponatremia  Resolved monitor with improving PO intake.    Chronic diastolic HF Edema distal to LLE wound Monitor fluid status  Diuresis prn - giving dose lasix  today   Hypoalbuminemia  Encourage po intake   HTN  HLD Continue statin Hold home antihypertensives. Mostly normotensive off of midodrine Continue to monitor blood pressure   T2DM  A1c 8 Continue long-acting 15 units Continue mealtime 3 units 3 times daily      DVT prophylaxis:  ELiquis  IV fluids: no continuous IV fluids  Nutrition: carb modified  Central lines / invasive devices: wound vac   Code Status: FULL CODE ACP documentation reviewed:  HCPOA and living will are on file in [facility]  [facility] needs: home health  Barriers to dispo / significant pending items: wound vac in place, iv abx, plannign wound vac change in OR              Subjective / Brief ROS:  Patient reports doing well today LE edema worsening, no SOB. No CP Pain controlled.  Denies new weakness.  Tolerating diet.   Family Communication: none at this time     Objective Findings:  Vitals:   07/02/23 0907 07/02/23 1514 07/02/23 2333 07/03/23 0831  BP: 126/66 (!) 103/55 (!) 123/45 (!) 124/55  Pulse: 89 70 67 68  Resp: 19 19 20 16   Temp: 98.2 F (36.8 C) 97.8 F (36.6 C) 98.1 F (36.7 C) 98.2 F (36.8 C)  TempSrc: Oral Oral  Oral  SpO2: 96% 98% 96% 99%  Weight:      Height:       No intake or output data in the 24 hours ending 07/03/23 1559  Filed Weights   07/01/23 0500 07/02/23 0639 07/02/23 0712  Weight: 74.7 kg 73.2 kg 73.2 kg    Examination:  Physical Exam Constitutional:      General: She is not in acute distress. Cardiovascular:     Rate and Rhythm: Normal rate and regular rhythm.  Pulmonary:     Effort: Pulmonary effort is normal.     Breath sounds: Normal breath sounds.  Musculoskeletal:     Right lower leg: Edema present.     Left lower leg: Edema present.  Neurological:     General: No focal deficit present.     Mental Status: She is alert and oriented to person, place, and time. Mental status is at baseline.  Psychiatric:        Mood and Affect: Mood normal.        Behavior: Behavior normal.          Scheduled Medications:   apixaban  2.5 mg Oral BID   aspirin  81 mg Oral QHS   atorvastatin  20 mg Oral Daily   Chlorhexidine Gluconate Cloth  6 each Topical Daily   cholecalciferol  2,000 Units Oral Daily   gabapentin  300 mg Oral BID    insulin aspart  0-15 Units Subcutaneous TID AC & HS   insulin aspart  3 Units Subcutaneous TID WC   insulin glargine-yfgn  15 Units Subcutaneous QHS   multivitamin with minerals  1 tablet Oral Daily   [START ON 07/04/2023] pantoprazole  40 mg Oral Daily   polyethylene glycol  17 g Oral Daily   senna  1 tablet Oral Daily    Continuous Infusions:  ampicillin-sulbactam (UNASYN) IV 3 g (07/03/23 0920)    PRN Medications:  acetaminophen, ALPRAZolam, alum & mag hydroxide-simeth, fentaNYL (SUBLIMAZE) injection, ondansetron (ZOFRAN) IV, oxyCODONE-acetaminophen  Antimicrobials from admission:  Anti-infectives (From admission, onward)    Start     Dose/Rate Route Frequency Ordered Stop   06/29/23 0620  ceFAZolin (ANCEF) IVPB  2g/100 mL premix        2 g 200 mL/hr over 30 Minutes Intravenous 30 min pre-op 06/29/23 0620 06/29/23 0747   06/26/23 1800  Ampicillin-Sulbactam (UNASYN) 3 g in sodium chloride 0.9 % 100 mL IVPB        3 g 200 mL/hr over 30 Minutes Intravenous Every 6 hours 06/26/23 1646     06/24/23 1322  gentamicin (GARAMYCIN) injection  Status:  Discontinued          As needed 06/24/23 1322 06/24/23 1347   06/24/23 1303  tobramycin (NEBCIN) powder  Status:  Discontinued          As needed 06/24/23 1303 06/24/23 1347   06/24/23 1303  vancomycin (VANCOCIN) powder  Status:  Discontinued          As needed 06/24/23 1304 06/24/23 1347   06/24/23 1200  vancomycin (VANCOREADY) IVPB 1250 mg/250 mL  Status:  Discontinued       Placed in "Followed by" Linked Group   1,250 mg 166.7 mL/hr over 90 Minutes Intravenous Every 24 hours 06/23/23 1244 06/23/23 1651   06/24/23 1000  DAPTOmycin (CUBICIN) IVPB 500 mg/72mL premix  Status:  Discontinued        8 mg/kg  64.1 kg 100 mL/hr over 30 Minutes Intravenous Daily 06/23/23 1651 06/26/23 1646   06/24/23 0234  ceFAZolin (ANCEF) IVPB 2g/100 mL premix        2 g 200 mL/hr over 30 Minutes Intravenous 30 min pre-op 06/24/23 0234 06/24/23 1300    06/23/23 1800  ceFEPIme (MAXIPIME) 2 g in sodium chloride 0.9 % 100 mL IVPB  Status:  Discontinued        2 g 200 mL/hr over 30 Minutes Intravenous Every 8 hours 06/23/23 1242 06/26/23 1646   06/23/23 1330  vancomycin (VANCOREADY) IVPB 1250 mg/250 mL  Status:  Discontinued       Placed in "Followed by" Linked Group   1,250 mg 166.7 mL/hr over 90 Minutes Intravenous Every 24 hours 06/23/23 1242 06/23/23 1244   06/23/23 1201  vancomycin (VANCOCIN) IVPB 1000 mg/200 mL premix  Status:  Discontinued       Placed in "Followed by" Linked Group   1,000 mg 200 mL/hr over 60 Minutes Intravenous Every 24 hours 06/22/23 0042 06/23/23 1242   06/22/23 2200  ceFEPIme (MAXIPIME) 2 g in sodium chloride 0.9 % 100 mL IVPB  Status:  Discontinued        2 g 200 mL/hr over 30 Minutes Intravenous Every 12 hours 06/22/23 2120 06/23/23 1242   06/22/23 0200  piperacillin-tazobactam (ZOSYN) IVPB 3.375 g  Status:  Discontinued        3.375 g 12.5 mL/hr over 240 Minutes Intravenous Every 8 hours 06/22/23 0042 06/22/23 2117   06/22/23 0045  vancomycin (VANCOCIN) IVPB 1000 mg/200 mL premix       Placed in "Followed by" Linked Group   1,000 mg 200 mL/hr over 60 Minutes Intravenous STAT 06/22/23 0042 06/22/23 0254           Data Reviewed:  I have personally reviewed the following...  CBC: Recent Labs  Lab 06/28/23 0339 06/29/23 0359 06/30/23 0546 07/02/23 1644 07/03/23 0511  WBC 24.7* 18.3* 15.2* 14.3* 12.4*  NEUTROABS  --   --   --  11.6*  --   HGB 9.7* 8.4* 8.5* 9.6* 9.0*  HCT 28.1* 24.3* 24.7* 28.8* 27.7*  MCV 85.2 87.7 87.0 89.2 90.2  PLT 422* 348 383 431* 434*   Basic Metabolic  Panel: Recent Labs  Lab 06/27/23 0621 06/27/23 0802 06/27/23 1349 06/28/23 0339 06/28/23 0340 06/29/23 0359 06/30/23 0546 07/03/23 0511  NA QUESTIONABLE RESULTS, RECOMMEND RECOLLECT TO VERIFY   < > 134*  --  134* 135 134* 136  K QUESTIONABLE RESULTS, RECOMMEND RECOLLECT TO VERIFY   < > 4.7  --  3.9 4.2 4.0 4.4   CL QUESTIONABLE RESULTS, RECOMMEND RECOLLECT TO VERIFY   < > 108  --  107 105 103 100  CO2 QUESTIONABLE RESULTS, RECOMMEND RECOLLECT TO VERIFY   < > 19*  --  22 22 24 25   GLUCOSE QUESTIONABLE RESULTS, RECOMMEND RECOLLECT TO VERIFY   < > 163*  --  79 174* 141* 150*  BUN QUESTIONABLE RESULTS, RECOMMEND RECOLLECT TO VERIFY   < > 24*  --  22 22 19 21   CREATININE 0.75   < > 0.79  --  0.73 0.77 0.73 0.73  CALCIUM QUESTIONABLE RESULTS, RECOMMEND RECOLLECT TO VERIFY   < > 8.6*  --  8.4* 8.0* 7.9* 7.9*  MG  --   --   --  2.0  --   --   --   --   PHOS QUESTIONABLE RESULTS, RECOMMEND RECOLLECT TO VERIFY  --   --  2.9 2.9 3.2  --   --    < > = values in this interval not displayed.   GFR: Estimated Creatinine Clearance: 69.1 mL/min (by C-G formula based on SCr of 0.73 mg/dL). Liver Function Tests: Recent Labs  Lab 06/27/23 0621 06/28/23 0340 06/29/23 0359  ALBUMIN QUESTIONABLE RESULTS, RECOMMEND RECOLLECT TO VERIFY 2.0* 1.8*   No results for input(s): "LIPASE", "AMYLASE" in the last 168 hours. No results for input(s): "AMMONIA" in the last 168 hours. Coagulation Profile: No results for input(s): "INR", "PROTIME" in the last 168 hours. Cardiac Enzymes: No results for input(s): "CKTOTAL", "CKMB", "CKMBINDEX", "TROPONINI" in the last 168 hours.  BNP (last 3 results) No results for input(s): "PROBNP" in the last 8760 hours. HbA1C: No results for input(s): "HGBA1C" in the last 72 hours. CBG: Recent Labs  Lab 07/02/23 0910 07/02/23 1200 07/02/23 2122 07/03/23 0827 07/03/23 1213  GLUCAP 114* 146* 248* 142* 147*   Lipid Profile: No results for input(s): "CHOL", "HDL", "LDLCALC", "TRIG", "CHOLHDL", "LDLDIRECT" in the last 72 hours. Thyroid Function Tests: No results for input(s): "TSH", "T4TOTAL", "FREET4", "T3FREE", "THYROIDAB" in the last 72 hours. Anemia Panel: No results for input(s): "VITAMINB12", "FOLATE", "FERRITIN", "TIBC", "IRON", "RETICCTPCT" in the last 72 hours.  Most Recent  Urinalysis On File:     Component Value Date/Time   COLORURINE YELLOW (A) 05/11/2023 1317   APPEARANCEUR TURBID (A) 05/11/2023 1317   APPEARANCEUR Hazy (A) 08/12/2022 1430   LABSPEC 1.019 05/11/2023 1317   LABSPEC 1.010 01/09/2014 1108   PHURINE 5.0 05/11/2023 1317   GLUCOSEU NEGATIVE 05/11/2023 1317   GLUCOSEU Negative 01/09/2014 1108   HGBUR LARGE (A) 05/11/2023 1317   BILIRUBINUR NEGATIVE 05/11/2023 1317   BILIRUBINUR Negative 08/12/2022 1430   BILIRUBINUR Negative 01/09/2014 1108   KETONESUR 5 (A) 05/11/2023 1317   PROTEINUR 100 (A) 05/11/2023 1317   UROBILINOGEN 0.2 04/09/2016 1159   NITRITE POSITIVE (A) 05/11/2023 1317   LEUKOCYTESUR MODERATE (A) 05/11/2023 1317   LEUKOCYTESUR 3+ 01/09/2014 1108   Sepsis Labs: @LABRCNTIP (procalcitonin:4,lacticidven:4) Microbiology: Recent Results (from the past 240 hours)  MRSA Next Gen by PCR, Nasal     Status: None   Collection Time: 06/23/23  6:45 PM   Specimen: Nasal Mucosa; Nasal Swab  Result  Value Ref Range Status   MRSA by PCR Next Gen NOT DETECTED NOT DETECTED Final    Comment: (NOTE) The GeneXpert MRSA Assay (FDA approved for NASAL specimens only), is one component of a comprehensive MRSA colonization surveillance program. It is not intended to diagnose MRSA infection nor to guide or monitor treatment for MRSA infections. Test performance is not FDA approved in patients less than 26 years old. Performed at [facility], [address] [place], [place] [address]   Culture, blood (Routine X 2) w Reflex to ID Panel     Status: None   Collection Time: 06/23/23  7:18 PM   Specimen: BLOOD  Result Value Ref Range Status   Specimen Description BLOOD RIGHT ANTECUBITAL  Final   Special Requests   Final    BOTTLES DRAWN AEROBIC AND ANAEROBIC Blood Culture adequate volume   Culture   Final    NO GROWTH 5 DAYS Performed at [facility], [address], [place], [place] [address]    Report Status 06/28/2023  FINAL  Final  Aerobic/Anaerobic Culture w Gram Stain (surgical/deep wound)     Status: None   Collection Time: 06/24/23  6:42 AM   Specimen: Path fluid; Body Fluid  Result Value Ref Range Status   Specimen Description WOUND  Final   Special Requests LEFT GROIN  Final   Gram Stain NO WBC SEEN NO ORGANISMS SEEN   Final   Culture   Final    FEW ESCHERICHIA COLI NO ANAEROBES ISOLATED Performed at [facility], [address], [place], [place] [address]    Report Status 06/29/2023 FINAL  Final   Organism ID, Bacteria ESCHERICHIA COLI  Final      Susceptibility   Escherichia coli - MIC*    AMPICILLIN <=2 SENSITIVE Sensitive     CEFEPIME <=0.12 SENSITIVE Sensitive     CEFTAZIDIME <=1 SENSITIVE Sensitive     CEFTRIAXONE <=0.25 SENSITIVE Sensitive     CIPROFLOXACIN <=0.25 SENSITIVE Sensitive     GENTAMICIN <=1 SENSITIVE Sensitive     IMIPENEM <=0.25 SENSITIVE Sensitive     TRIMETH/SULFA <=20 SENSITIVE Sensitive     AMPICILLIN/SULBACTAM <=2 SENSITIVE Sensitive     PIP/TAZO <=4 SENSITIVE Sensitive ug/mL    * FEW ESCHERICHIA COLI  Aerobic/Anaerobic Culture w Gram Stain (surgical/deep wound)     Status: None   Collection Time: 06/24/23 12:55 PM   Specimen: Path Tissue  Result Value Ref Range Status   Specimen Description WOUND  Final   Special Requests LEFT GROIN  Final   Gram Stain NO WBC SEEN NO ORGANISMS SEEN   Final   Culture   Final    FEW ESCHERICHIA COLI SUSCEPTIBILITIES PERFORMED ON PREVIOUS CULTURE WITHIN THE LAST 5 DAYS. NO ANAEROBES ISOLATED Performed at [facility], [address] [place], [place] [address]    Report Status 06/29/2023 FINAL  Final  Aerobic/Anaerobic Culture w Gram Stain (surgical/deep wound)     Status: None (Preliminary result)   Collection Time: 07/02/23  8:01 AM   Specimen: Wound; Abscess  Result Value Ref Range Status   Specimen Description   Final    WOUND Performed at [facility], [address] [place], [place]  [address]    Special Requests   Final    NONE Performed at [facility], [address] [place], [place] [address]    Gram Stain   Final    RARE WBC PRESENT, PREDOMINANTLY PMN NO ORGANISMS SEEN  Culture   Final    NO GROWTH < 24 HOURS Performed at [facility], [address] [place], [place] [address]    Report Status PENDING  Incomplete      Radiology Studies last 3 days: No results found.      Sunnie Nielsen, DO [facility] 07/03/2023, 3:59 PM    Dictation software may have been used to generate the above note. Typos may occur and escape review in typed/dictated notes. Please contact Dr Lyn Hollingshead directly for clarity if needed.  Staff may message me via secure chat in Epic  but this may not receive an immediate response,  please page me for urgent matters!  If 7PM-7AM, please contact night coverage [email or web address]

## 2023-07-03 NOTE — Plan of Care (Signed)
Patient ID: Laura Reed, female   DOB: [birth year], 62 y.o.   MRN: [id number]  Problem: Education: Goal: Ability to describe self-care measures that may prevent or decrease complications (Diabetes Survival Skills Education) will improve 07/03/2023 1659 by Lidia Collum, RN Outcome: Progressing 07/03/2023 1659 by Lidia Collum, RN Outcome: Progressing Goal: Individualized Educational Video(s) Outcome: Progressing   Problem: Coping: Goal: Ability to adjust to condition or change in health will improve Outcome: Progressing   Problem: Fluid Volume: Goal: Ability to maintain a balanced intake and output will improve Outcome: Progressing   Problem: Health Behavior/Discharge Planning: Goal: Ability to identify and utilize available resources and services will improve Outcome: Progressing Goal: Ability to manage health-related needs will improve Outcome: Progressing   Problem: Metabolic: Goal: Ability to maintain appropriate glucose levels will improve Outcome: Progressing   Problem: Nutritional: Goal: Maintenance of adequate nutrition will improve Outcome: Progressing Goal: Progress toward achieving an optimal weight will improve Outcome: Progressing   Problem: Skin Integrity: Goal: Risk for impaired skin integrity will decrease Outcome: Progressing   Problem: Tissue Perfusion: Goal: Adequacy of tissue perfusion will improve Outcome: Progressing   Problem: Education: Goal: Knowledge of General Education information will improve Description: Including pain rating scale, medication(s)/side effects and non-pharmacologic comfort measures Outcome: Progressing   Problem: Health Behavior/Discharge Planning: Goal: Ability to manage health-related needs will improve Outcome: Progressing   Problem: Clinical Measurements: Goal: Ability to maintain clinical measurements within normal limits will improve Outcome: Progressing Goal: Will remain free from infection Outcome:  Progressing Goal: Diagnostic test results will improve Outcome: Progressing Goal: Respiratory complications will improve Outcome: Progressing Goal: Cardiovascular complication will be avoided Outcome: Progressing   Problem: Activity: Goal: Risk for activity intolerance will decrease Outcome: Progressing   Problem: Nutrition: Goal: Adequate nutrition will be maintained Outcome: Progressing   Problem: Coping: Goal: Level of anxiety will decrease Outcome: Progressing   Problem: Elimination: Goal: Will not experience complications related to bowel motility Outcome: Progressing Goal: Will not experience complications related to urinary retention Outcome: Progressing   Problem: Pain Management: Goal: General experience of comfort will improve Outcome: Progressing   Problem: Safety: Goal: Ability to remain free from injury will improve Outcome: Progressing   Problem: Skin Integrity: Goal: Risk for impaired skin integrity will decrease Outcome: Progressing    Lidia Collum, RN

## 2023-07-03 NOTE — Progress Notes (Signed)
ID Pt sitting in recliner Keeping legs elevated   O/e awake and alert Patient Vitals for the past 24 hrs:  BP Temp Temp src Pulse Resp SpO2  07/03/23 1704 105/60 98.5 F (36.9 C) -- 70 17 99 %  07/03/23 0831 (!) 124/55 98.2 F (36.8 C) Oral 68 16 99 %  07/02/23 2333 (!) 123/45 98.1 F (36.7 C) -- 67 20 96 %   Chest CTA Hss1s2 Abd soft Left groin wound  picture reviewed Some slough      Edema legs- left > rt Left TMA  CNS non focal   Labs    Latest Ref Rng & Units 07/03/2023    5:11 AM 07/02/2023    4:44 PM 06/30/2023    5:46 AM  CBC  WBC 4.0 - 10.5 K/uL 12.4  14.3  15.2   Hemoglobin 12.0 - 15.0 g/dL 9.0  9.6  8.5   Hematocrit 36.0 - 46.0 % 27.7  28.8  24.7   Platelets 150 - 400 K/uL 434  431  383        Latest Ref Rng & Units 07/03/2023    5:11 AM 06/30/2023    5:46 AM 06/29/2023    3:59 AM  CMP  Glucose 70 - 99 mg/dL 409  811  914   BUN 8 - 23 mg/dL 21  19  22    Creatinine 0.44 - 1.00 mg/dL 7.82  9.56  2.13   Sodium 135 - 145 mmol/L 136  134  135   Potassium 3.5 - 5.1 mmol/L 4.4  4.0  4.2   Chloride 98 - 111 mmol/L 100  103  105   CO2 22 - 32 mmol/L 25  24  22    Calcium 8.9 - 10.3 mg/dL 7.9  7.9  8.0     Micro Groin abscess- Ecoli  Impression/recommendation PAD s/p left common femoral artery to tibioperoneal trunk artery bypass  with PTFE graft in OCT 2024   Abscess due to Ecoli  at the surgical site- CT showed  10 cm complex collection s/p excisional debridement of the abscess and rotation of a sarorius muscle flap for coverage of existing bypass grafts 06/24/23 Repeat culture sent today   Ecoli in culture so far Pan sensitive- on unasyn Day 8 of antibiotic May need up to 4 weeks--07/22/23 Continue IV, depending on the progression of wound and after discussion with vascular we may be able to switch to PO on a future date   Leucocytosis-improving   -    Left TMA- healed well  ecoli and enterococcus in  culture in sept when she had TMA   CAD  s/p CABG   H/o bladder ca s/p left nephrectomy  Solitary kidney- avoid nephrotoxic drugs   AKI-  resolved   Discussed the management with the patient   ID will follow her peripherally this weekend

## 2023-07-04 DIAGNOSIS — I739 Peripheral vascular disease, unspecified: Secondary | ICD-10-CM | POA: Diagnosis not present

## 2023-07-04 DIAGNOSIS — D72829 Elevated white blood cell count, unspecified: Secondary | ICD-10-CM | POA: Diagnosis not present

## 2023-07-04 DIAGNOSIS — L02214 Cutaneous abscess of groin: Secondary | ICD-10-CM | POA: Diagnosis not present

## 2023-07-04 LAB — BASIC METABOLIC PANEL
Anion gap: 7 (ref 5–15)
BUN: 19 mg/dL (ref 8–23)
CO2: 26 mmol/L (ref 22–32)
Calcium: 7.4 mg/dL — ABNORMAL LOW (ref 8.9–10.3)
Chloride: 101 mmol/L (ref 98–111)
Creatinine, Ser: 0.63 mg/dL (ref 0.44–1.00)
GFR, Estimated: >60 mL/min (ref >60)
Glucose, Bld: 85 mg/dL (ref 70–99)
Potassium: 3.8 mmol/L (ref 3.5–5.1)
Sodium: 134 mmol/L — ABNORMAL LOW (ref 135–145)

## 2023-07-04 LAB — GLUCOSE, CAPILLARY
Glucose-Capillary: 86 mg/dL (ref 70–99)
Glucose-Capillary: 87 mg/dL (ref 70–99)
Glucose-Capillary: 121 mg/dL — ABNORMAL HIGH (ref 70–99)
Glucose-Capillary: 185 mg/dL — ABNORMAL HIGH (ref 70–99)

## 2023-07-04 MED ORDER — FUROSEMIDE 10 MG/ML IJ SOLN
60.0000 mg | Freq: Once | INTRAMUSCULAR | Status: AC
  Administered 2023-07-04: 60 mg via INTRAVENOUS
  Filled 2023-07-04: qty 8

## 2023-07-04 NOTE — Progress Notes (Signed)
PROGRESS NOTE    Laura Reed   GLO:[id number] DOB: 1961-04-08  DOA: 06/21/2023 Date of Service: 07/04/23 which is hospital day 13  PCP: Sherlene Shams, MD    HPI: 62 y.o female with significant PMH of HTN, HLD, DM, sCHF, CAD, PAD (s/p of left common femoral-distal bypass, thrombectomy, angioplasty and stent placement 04/2023 on aspirin, Plavix and Eliquis), anxiety, bladder cancer (s/p of nephrectomy). [facility] on 06/21/23 with complaints of right groin pain, redness and oozing post op. Patient has hx of PAD and recently underwent left common femoral-distal bypass, thrombectomy, angioplasty and stent placement 04/2023 and right transmetatarsal amputation. SBP in the 50s and was sent to the ED for further evaluation.   Hospital course / significant events:  12/16: admitted to hospitalist service later transferred to ICU for pressor support.  12/18: to OR, 200 cc pus drained from L groin. ID consulted.  12/19: remains stable off pressors 12/20: to OR, left groin debridement, muscle flap, wound VAC exchange  12/23: wound vac exchange. Wound appears appropriate and flap appears viable. On Eliquis, ASA. Still holding Plavix and can probably resume once confirm no further plans for OR.  12/24-12/25: stable, planning repeat wound vac change 12/26 12/26: irrigation/debridement and vac change today.  12/27: await return to OR Sunday 12/29 for wound vac exchange. Diuresing for bilateral LE edema, Hx HFpEf   Consultants:  [facility] Vascular Surgery  Infectious disease   Procedures/Surgeries: 06/24/23: Excisional debridement of left groin abscess. Rotation of a sartorius muscle flap for coverage of the existing bypass grafts. Placement of a nondisposable wound VAC dressing left groin. Redo vascular surgery - Drs. [name] and Dew 06/26/23: Excisional debridement left groin wound. Placement of a nondisposable VAC dressing - Dr Gilda Crease 06/29/23: Left groin VAC dressing change - Dr Gilda Crease    07/02/23: Irrigation and debridement of skin, soft tissue, and muscle for approximately 125 cm to the left groin wound, Pulse lavage irrigation left groin. Left groin VAC dressing change - Dr Wyn Quaker       ASSESSMENT & PLAN:   Left groin abscess Sepsis with septic shock resolved Status post I&D with vascular surgery 12/18 with takeback to the OR for left groin debridement, muscle flap, wound VAC exchange 12/20, wound vac exchange 12/23 continue antimicrobials per ID Pain control Vascular following, likely for another wound vac exchange in the OR Mon 12/30   PAD s/p of left common femoral-tibioperoneal trunk bypass, thrombectomy, angioplasty and stent placement 04/2023  on aspirin, Plavix and Eliquis  Continue eliquis and aspirin Continue statin plavix is being held. Can likely resume once no further plans for OR    Leukocytosis  In the setting of her infection and multiple trips to the OR.   Monitor CBC Per ID consider transition to po abx once WBC improving    Normocytic anemia Expected ABLA d/t surgeries normal hemoglobin prior to vascular procedures.   some blood tinge output in her wound VAC S/p 1 unit of packed red blood cells by vascular team.  Can discharge on iron supplemntation  Continue to monitor CBC   AKI  Likely d/t septic shock resolved Monitor BMP   PVC Patient noted to have frequent PVCs on tele This is likely causing her heart rate to appear to be lower than normal.  Will ensure potassium >4, Magnesium>2 Continue to monitor   Hyponatremia  Resolved monitor with improving PO intake.    Chronic diastolic HF Edema bilateral LE Monitor fluid status  Diuresis prn - giving  another dose IV lasix today  Monitor BMP w/ diuresis  Hypoalbuminemia  Encourage po intake   HTN  HLD Continue statin Hold home antihypertensives. Mostly normotensive off of midodrine Continue to monitor blood pressure   T2DM  A1c 8 Continue long-acting 15 units Continue  mealtime 3 units 3 times daily      DVT prophylaxis: ELiquis  IV fluids: no continuous IV fluids  Nutrition: carb modified  Central lines / invasive devices: wound vac   Code Status: FULL CODE ACP documentation reviewed:  HCPOA and living will are on file in [facility]  [facility] needs: home health  Barriers to dispo / significant pending items: wound vac in place, iv abx, plannign wound vac change in OR              Subjective / Brief ROS:  Patient reports doing well today LE edema a bit better but still present, no SOB. No CP Pain controlled.  Denies new weakness.  Tolerating diet.   Family Communication: none at this time     Objective Findings:  Vitals:   07/03/23 1704 07/03/23 2212 07/04/23 0500 07/04/23 0719  BP: 105/60 (!) 119/40  (!) 119/45  Pulse: 70 73  66  Resp: 17 13  12   Temp: 98.5 F (36.9 C) 99.5 F (37.5 C)    TempSrc:      SpO2: 99% 93%  98%  Weight:   75.8 kg   Height:        Intake/Output Summary (Last 24 hours) at 07/04/2023 1227 Last data filed at 07/04/2023 0214 Gross per 24 hour  Intake 1116.94 ml  Output 1900 ml  Net -783.06 ml    Filed Weights   07/02/23 0639 07/02/23 0712 07/04/23 0500  Weight: 73.2 kg 73.2 kg 75.8 kg    Examination:  Physical Exam Constitutional:      General: She is not in acute distress. Cardiovascular:     Rate and Rhythm: Normal rate and regular rhythm.  Pulmonary:     Effort: Pulmonary effort is normal.     Breath sounds: Normal breath sounds.  Musculoskeletal:     Right lower leg: Edema present.     Left lower leg: Edema present.  Neurological:     General: No focal deficit present.     Mental Status: She is alert and oriented to person, place, and time. Mental status is at baseline.  Psychiatric:        Mood and Affect: Mood normal.        Behavior: Behavior normal.          Scheduled Medications:   apixaban  2.5 mg Oral BID   aspirin  81 mg Oral QHS   atorvastatin  20 mg Oral  Daily   Chlorhexidine Gluconate Cloth  6 each Topical Daily   cholecalciferol  2,000 Units Oral Daily   gabapentin  300 mg Oral BID   insulin aspart  0-15 Units Subcutaneous TID AC & HS   insulin aspart  3 Units Subcutaneous TID WC   insulin glargine-yfgn  15 Units Subcutaneous QHS   multivitamin with minerals  1 tablet Oral Daily   pantoprazole  40 mg Oral Daily   polyethylene glycol  17 g Oral Daily   senna  1 tablet Oral Daily    Continuous Infusions:  ampicillin-sulbactam (UNASYN) IV 3 g (07/04/23 1017)    PRN Medications:  acetaminophen, ALPRAZolam, alum & mag hydroxide-simeth, fentaNYL (SUBLIMAZE) injection, ondansetron (ZOFRAN) IV, mouth rinse, oxyCODONE-acetaminophen  Antimicrobials from  admission:  Anti-infectives (From admission, onward)    Start     Dose/Rate Route Frequency Ordered Stop   06/29/23 0620  ceFAZolin (ANCEF) IVPB 2g/100 mL premix        2 g 200 mL/hr over 30 Minutes Intravenous 30 min pre-op 06/29/23 0620 06/29/23 0747   06/26/23 1800  Ampicillin-Sulbactam (UNASYN) 3 g in sodium chloride 0.9 % 100 mL IVPB        3 g 200 mL/hr over 30 Minutes Intravenous Every 6 hours 06/26/23 1646     06/24/23 1322  gentamicin (GARAMYCIN) injection  Status:  Discontinued          As needed 06/24/23 1322 06/24/23 1347   06/24/23 1303  tobramycin (NEBCIN) powder  Status:  Discontinued          As needed 06/24/23 1303 06/24/23 1347   06/24/23 1303  vancomycin (VANCOCIN) powder  Status:  Discontinued          As needed 06/24/23 1304 06/24/23 1347   06/24/23 1200  vancomycin (VANCOREADY) IVPB 1250 mg/250 mL  Status:  Discontinued       Placed in "Followed by" Linked Group   1,250 mg 166.7 mL/hr over 90 Minutes Intravenous Every 24 hours 06/23/23 1244 06/23/23 1651   06/24/23 1000  DAPTOmycin (CUBICIN) IVPB 500 mg/31mL premix  Status:  Discontinued        8 mg/kg  64.1 kg 100 mL/hr over 30 Minutes Intravenous Daily 06/23/23 1651 06/26/23 1646   06/24/23 0234  ceFAZolin  (ANCEF) IVPB 2g/100 mL premix        2 g 200 mL/hr over 30 Minutes Intravenous 30 min pre-op 06/24/23 0234 06/24/23 1300   06/23/23 1800  ceFEPIme (MAXIPIME) 2 g in sodium chloride 0.9 % 100 mL IVPB  Status:  Discontinued        2 g 200 mL/hr over 30 Minutes Intravenous Every 8 hours 06/23/23 1242 06/26/23 1646   06/23/23 1330  vancomycin (VANCOREADY) IVPB 1250 mg/250 mL  Status:  Discontinued       Placed in "Followed by" Linked Group   1,250 mg 166.7 mL/hr over 90 Minutes Intravenous Every 24 hours 06/23/23 1242 06/23/23 1244   06/23/23 1201  vancomycin (VANCOCIN) IVPB 1000 mg/200 mL premix  Status:  Discontinued       Placed in "Followed by" Linked Group   1,000 mg 200 mL/hr over 60 Minutes Intravenous Every 24 hours 06/22/23 0042 06/23/23 1242   06/22/23 2200  ceFEPIme (MAXIPIME) 2 g in sodium chloride 0.9 % 100 mL IVPB  Status:  Discontinued        2 g 200 mL/hr over 30 Minutes Intravenous Every 12 hours 06/22/23 2120 06/23/23 1242   06/22/23 0200  piperacillin-tazobactam (ZOSYN) IVPB 3.375 g  Status:  Discontinued        3.375 g 12.5 mL/hr over 240 Minutes Intravenous Every 8 hours 06/22/23 0042 06/22/23 2117   06/22/23 0045  vancomycin (VANCOCIN) IVPB 1000 mg/200 mL premix       Placed in "Followed by" Linked Group   1,000 mg 200 mL/hr over 60 Minutes Intravenous STAT 06/22/23 0042 06/22/23 0254           Data Reviewed:  I have personally reviewed the following...  CBC: Recent Labs  Lab 06/28/23 0339 06/29/23 0359 06/30/23 0546 07/02/23 1644 07/03/23 0511  WBC 24.7* 18.3* 15.2* 14.3* 12.4*  NEUTROABS  --   --   --  11.6*  --   HGB 9.7* 8.4* 8.5*  9.6* 9.0*  HCT 28.1* 24.3* 24.7* 28.8* 27.7*  MCV 85.2 87.7 87.0 89.2 90.2  PLT 422* 348 383 431* 434*   Basic Metabolic Panel: Recent Labs  Lab 06/28/23 0339 06/28/23 0340 06/29/23 0359 06/30/23 0546 07/03/23 0511 07/04/23 0423  NA  --  134* 135 134* 136 134*  K  --  3.9 4.2 4.0 4.4 3.8  CL  --  107 105 103  100 101  CO2  --  22 22 24 25 26   GLUCOSE  --  79 174* 141* 150* 85  BUN  --  22 22 19 21 19   CREATININE  --  0.73 0.77 0.73 0.73 0.63  CALCIUM  --  8.4* 8.0* 7.9* 7.9* 7.4*  MG 2.0  --   --   --   --   --   PHOS 2.9 2.9 3.2  --   --   --    GFR: Estimated Creatinine Clearance: 75.5 mL/min (by C-G formula based on SCr of 0.63 mg/dL). Liver Function Tests: Recent Labs  Lab 06/28/23 0340 06/29/23 0359  ALBUMIN 2.0* 1.8*   No results for input(s): "LIPASE", "AMYLASE" in the last 168 hours. No results for input(s): "AMMONIA" in the last 168 hours. Coagulation Profile: No results for input(s): "INR", "PROTIME" in the last 168 hours. Cardiac Enzymes: No results for input(s): "CKTOTAL", "CKMB", "CKMBINDEX", "TROPONINI" in the last 168 hours.  BNP (last 3 results) No results for input(s): "PROBNP" in the last 8760 hours. HbA1C: No results for input(s): "HGBA1C" in the last 72 hours. CBG: Recent Labs  Lab 07/03/23 1213 07/03/23 1719 07/03/23 2111 07/04/23 0721 07/04/23 1155  GLUCAP 147* 77 126* 86 87   Lipid Profile: No results for input(s): "CHOL", "HDL", "LDLCALC", "TRIG", "CHOLHDL", "LDLDIRECT" in the last 72 hours. Thyroid Function Tests: No results for input(s): "TSH", "T4TOTAL", "FREET4", "T3FREE", "THYROIDAB" in the last 72 hours. Anemia Panel: No results for input(s): "VITAMINB12", "FOLATE", "FERRITIN", "TIBC", "IRON", "RETICCTPCT" in the last 72 hours.  Most Recent Urinalysis On File:     Component Value Date/Time   COLORURINE YELLOW (A) 05/11/2023 1317   APPEARANCEUR TURBID (A) 05/11/2023 1317   APPEARANCEUR Hazy (A) 08/12/2022 1430   LABSPEC 1.019 05/11/2023 1317   LABSPEC 1.010 01/09/2014 1108   PHURINE 5.0 05/11/2023 1317   GLUCOSEU NEGATIVE 05/11/2023 1317   GLUCOSEU Negative 01/09/2014 1108   HGBUR LARGE (A) 05/11/2023 1317   BILIRUBINUR NEGATIVE 05/11/2023 1317   BILIRUBINUR Negative 08/12/2022 1430   BILIRUBINUR Negative 01/09/2014 1108   KETONESUR  5 (A) 05/11/2023 1317   PROTEINUR 100 (A) 05/11/2023 1317   UROBILINOGEN 0.2 04/09/2016 1159   NITRITE POSITIVE (A) 05/11/2023 1317   LEUKOCYTESUR MODERATE (A) 05/11/2023 1317   LEUKOCYTESUR 3+ 01/09/2014 1108   Sepsis Labs: @LABRCNTIP (procalcitonin:4,lacticidven:4) Microbiology: Recent Results (from the past 240 hours)  Aerobic/Anaerobic Culture w Gram Stain (surgical/deep wound)     Status: None   Collection Time: 06/24/23 12:55 PM   Specimen: Path Tissue  Result Value Ref Range Status   Specimen Description WOUND  Final   Special Requests LEFT GROIN  Final   Gram Stain NO WBC SEEN NO ORGANISMS SEEN   Final   Culture   Final    FEW ESCHERICHIA COLI SUSCEPTIBILITIES PERFORMED ON PREVIOUS CULTURE WITHIN THE LAST 5 DAYS. NO ANAEROBES ISOLATED Performed at [facility], [address] [place], [place] [address]    Report Status 06/29/2023 FINAL  Final  Aerobic/Anaerobic Culture w Gram Stain (surgical/deep wound)  Status: None (Preliminary result)   Collection Time: 07/02/23  8:01 AM   Specimen: Wound; Abscess  Result Value Ref Range Status   Specimen Description   Final    WOUND Performed at [facility], [address] [place], [place] [address]    Special Requests   Final    NONE Performed at [facility], [address] [place], [place] [address]    Gram Stain   Final    RARE WBC PRESENT, PREDOMINANTLY PMN NO ORGANISMS SEEN    Culture   Final    NO GROWTH 2 DAYS Performed at [facility], [address], [place], [place] [address]    Report Status PENDING  Incomplete      Radiology Studies last 3 days: No results found.      Sunnie Nielsen, DO [facility] 07/04/2023, 12:27 PM    Dictation software may have been used to generate the above note. Typos may occur and escape review in typed/dictated notes. Please contact Dr Lyn Hollingshead directly for clarity if needed.  Staff may message me via secure chat in  Epic  but this may not receive an immediate response,  please page me for urgent matters!  If 7PM-7AM, please contact night coverage [email or web address]

## 2023-07-04 NOTE — Plan of Care (Signed)
  Problem: Coping: Goal: Ability to adjust to condition or change in health will improve Outcome: Progressing   Problem: Fluid Volume: Goal: Ability to maintain a balanced intake and output will improve Outcome: Progressing   Problem: Nutritional: Goal: Maintenance of adequate nutrition will improve Outcome: Progressing   Problem: Education: Goal: Knowledge of General Education information will improve Description: Including pain rating scale, medication(s)/side effects and non-pharmacologic comfort measures Outcome: Progressing

## 2023-07-04 NOTE — Progress Notes (Signed)
2 Days Post-Op   Subjective/Chief Complaint: Notes occasional LEFT groin pain. Controlled with current regimen. Otherwise without complaint. Up in chair this morning.   Objective: Vital signs in last 24 hours: Temp:  [98.5 F (36.9 C)-99.5 F (37.5 C)] 99.5 F (37.5 C) (12/27 2212) Pulse Rate:  [66-73] 66 (12/28 0719) Resp:  [12-17] 12 (12/28 0719) BP: (105-119)/(40-60) 119/45 (12/28 0719) SpO2:  [93 %-99 %] 98 % (12/28 0719) Weight:  [75.8 kg] 75.8 kg (12/28 0500) Last BM Date : 07/02/23  Intake/Output from previous day: 12/27 0701 - 12/28 0700 In: 1116.9 [IV Piggyback:1116.9] Out: 1900 [Urine:1450; Drains:450] Intake/Output this shift: No intake/output data recorded.  Cardio: regular rate and rhythm Extremities: LEFT groin- VAC in place with good seal/suction- minimal serous drainage in canister, leg/foot warm  Lab Results:  Recent Labs    07/02/23 1644 07/03/23 0511  WBC 14.3* 12.4*  HGB 9.6* 9.0*  HCT 28.8* 27.7*  PLT 431* 434*   BMET Recent Labs    07/03/23 0511 07/04/23 0423  NA 136 134*  K 4.4 3.8  CL 100 101  CO2 25 26  GLUCOSE 150* 85  BUN 21 19  CREATININE 0.73 0.63  CALCIUM 7.9* 7.4*   PT/INR No results for input(s): "LABPROT", "INR" in the last 72 hours. ABG No results for input(s): "PHART", "HCO3" in the last 72 hours.  Invalid input(s): "PCO2", "PO2"  Studies/Results: No results found.  Anti-infectives: Anti-infectives (From admission, onward)    Start     Dose/Rate Route Frequency Ordered Stop   06/29/23 0620  ceFAZolin (ANCEF) IVPB 2g/100 mL premix        2 g 200 mL/hr over 30 Minutes Intravenous 30 min pre-op 06/29/23 0620 06/29/23 0747   06/26/23 1800  Ampicillin-Sulbactam (UNASYN) 3 g in sodium chloride 0.9 % 100 mL IVPB        3 g 200 mL/hr over 30 Minutes Intravenous Every 6 hours 06/26/23 1646     06/24/23 1322  gentamicin (GARAMYCIN) injection  Status:  Discontinued          As needed 06/24/23 1322 06/24/23 1347    06/24/23 1303  tobramycin (NEBCIN) powder  Status:  Discontinued          As needed 06/24/23 1303 06/24/23 1347   06/24/23 1303  vancomycin (VANCOCIN) powder  Status:  Discontinued          As needed 06/24/23 1304 06/24/23 1347   06/24/23 1200  vancomycin (VANCOREADY) IVPB 1250 mg/250 mL  Status:  Discontinued       Placed in "Followed by" Linked Group   1,250 mg 166.7 mL/hr over 90 Minutes Intravenous Every 24 hours 06/23/23 1244 06/23/23 1651   06/24/23 1000  DAPTOmycin (CUBICIN) IVPB 500 mg/59mL premix  Status:  Discontinued        8 mg/kg  64.1 kg 100 mL/hr over 30 Minutes Intravenous Daily 06/23/23 1651 06/26/23 1646   06/24/23 0234  ceFAZolin (ANCEF) IVPB 2g/100 mL premix        2 g 200 mL/hr over 30 Minutes Intravenous 30 min pre-op 06/24/23 0234 06/24/23 1300   06/23/23 1800  ceFEPIme (MAXIPIME) 2 g in sodium chloride 0.9 % 100 mL IVPB  Status:  Discontinued        2 g 200 mL/hr over 30 Minutes Intravenous Every 8 hours 06/23/23 1242 06/26/23 1646   06/23/23 1330  vancomycin (VANCOREADY) IVPB 1250 mg/250 mL  Status:  Discontinued       Placed in "Followed by"  Linked Group   1,250 mg 166.7 mL/hr over 90 Minutes Intravenous Every 24 hours 06/23/23 1242 06/23/23 1244   06/23/23 1201  vancomycin (VANCOCIN) IVPB 1000 mg/200 mL premix  Status:  Discontinued       Placed in "Followed by" Linked Group   1,000 mg 200 mL/hr over 60 Minutes Intravenous Every 24 hours 06/22/23 0042 06/23/23 1242   06/22/23 2200  ceFEPIme (MAXIPIME) 2 g in sodium chloride 0.9 % 100 mL IVPB  Status:  Discontinued        2 g 200 mL/hr over 30 Minutes Intravenous Every 12 hours 06/22/23 2120 06/23/23 1242   06/22/23 0200  piperacillin-tazobactam (ZOSYN) IVPB 3.375 g  Status:  Discontinued        3.375 g 12.5 mL/hr over 240 Minutes Intravenous Every 8 hours 06/22/23 0042 06/22/23 2117   06/22/23 0045  vancomycin (VANCOCIN) IVPB 1000 mg/200 mL premix       Placed in "Followed by" Linked Group   1,000 mg 200  mL/hr over 60 Minutes Intravenous STAT 06/22/23 0042 06/22/23 0254       Assessment/Plan: s/p Procedure(s): WOUND VAC EXCHANGE LEFT GROIN (Left) FEM-FEM; Fem-Pop with prosthetic- Left groin wound/abscess now s/p serial debridements and washouts.  Plan for washout and VAC change on Monday. NPO after MN Sunday OOB to chair  LOS: 13 days    Eli Hose A 07/04/2023

## 2023-07-05 DIAGNOSIS — L02214 Cutaneous abscess of groin: Secondary | ICD-10-CM | POA: Diagnosis not present

## 2023-07-05 DIAGNOSIS — I739 Peripheral vascular disease, unspecified: Secondary | ICD-10-CM | POA: Diagnosis not present

## 2023-07-05 DIAGNOSIS — D72829 Elevated white blood cell count, unspecified: Secondary | ICD-10-CM | POA: Diagnosis not present

## 2023-07-05 LAB — GLUCOSE, CAPILLARY
Glucose-Capillary: 122 mg/dL — ABNORMAL HIGH (ref 70–99)
Glucose-Capillary: 125 mg/dL — ABNORMAL HIGH (ref 70–99)
Glucose-Capillary: 133 mg/dL — ABNORMAL HIGH (ref 70–99)
Glucose-Capillary: 231 mg/dL — ABNORMAL HIGH (ref 70–99)

## 2023-07-05 LAB — BASIC METABOLIC PANEL
Anion gap: 8 (ref 5–15)
BUN: 22 mg/dL (ref 8–23)
CO2: 26 mmol/L (ref 22–32)
Calcium: 7.3 mg/dL — ABNORMAL LOW (ref 8.9–10.3)
Chloride: 103 mmol/L (ref 98–111)
Creatinine, Ser: 0.67 mg/dL (ref 0.44–1.00)
GFR, Estimated: >60 mL/min (ref >60)
Glucose, Bld: 132 mg/dL — ABNORMAL HIGH (ref 70–99)
Potassium: 3.2 mmol/L — ABNORMAL LOW (ref 3.5–5.1)
Sodium: 137 mmol/L (ref 135–145)

## 2023-07-05 MED ORDER — POTASSIUM CHLORIDE CRYS ER 20 MEQ PO TBCR
40.0000 meq | EXTENDED_RELEASE_TABLET | Freq: Two times a day (BID) | ORAL | Status: AC
  Administered 2023-07-05 (×2): 40 meq via ORAL
  Filled 2023-07-05 (×2): qty 2

## 2023-07-05 MED ORDER — POTASSIUM CHLORIDE CRYS ER 20 MEQ PO TBCR: 40.0000 meq | EXTENDED_RELEASE_TABLET | Freq: Once | ORAL | Status: DC

## 2023-07-05 MED ORDER — FUROSEMIDE 10 MG/ML IJ SOLN
40.0000 mg | Freq: Once | INTRAMUSCULAR | Status: AC
Start: 2023-07-05 — End: 2023-07-05
  Administered 2023-07-05: 40 mg via INTRAVENOUS
  Filled 2023-07-05: qty 4

## 2023-07-05 NOTE — Progress Notes (Signed)
PROGRESS NOTE    Laura Reed   RJJ:[id number] DOB: January 05, 1961  DOA: 06/21/2023 Date of Service: 07/05/23 which is hospital day 14  PCP: Sherlene Shams, MD    HPI: 62 y.o female with significant PMH of HTN, HLD, DM, sCHF, CAD, PAD (s/p of left common femoral-distal bypass, thrombectomy, angioplasty and stent placement 04/2023 on aspirin, Plavix and Eliquis), anxiety, bladder cancer (s/p of nephrectomy). [facility] on 06/21/23 with complaints of right groin pain, redness and oozing post op. Patient has hx of PAD and recently underwent left common femoral-distal bypass, thrombectomy, angioplasty and stent placement 04/2023 and right transmetatarsal amputation. SBP in the 50s and was sent to the ED for further evaluation.   Hospital course / significant events:  12/16: admitted to hospitalist service later transferred to ICU for pressor support.  12/18: to OR, 200 cc pus drained from L groin. ID consulted.  12/19: remains stable off pressors 12/20: to OR, left groin debridement, muscle flap, wound VAC exchange  12/23: wound vac exchange. Wound appears appropriate and flap appears viable. On Eliquis, ASA. Still holding Plavix and can probably resume once confirm no further plans for OR.  12/24-12/25: stable, planning repeat wound vac change 12/26 12/26: irrigation/debridement and vac change today.  12/27-12/29: await return to OR Sunday 12/29 for wound vac exchange. Diuresing for bilateral LE edema, Hx HFpEF   Consultants:  [facility] Vascular Surgery  Infectious disease   Procedures/Surgeries: 06/24/23: Excisional debridement of left groin abscess. Rotation of a sartorius muscle flap for coverage of the existing bypass grafts. Placement of a nondisposable wound VAC dressing left groin. Redo vascular surgery - Drs. [name] and Dew 06/26/23: Excisional debridement left groin wound. Placement of a nondisposable VAC dressing - Dr Gilda Crease 06/29/23: Left groin VAC dressing change - Dr  Gilda Crease   07/02/23: Irrigation and debridement of skin, soft tissue, and muscle for approximately 125 cm to the left groin wound, Pulse lavage irrigation left groin. Left groin VAC dressing change - Dr Wyn Quaker       ASSESSMENT & PLAN:   Left groin abscess Sepsis with septic shock resolved Status post I&D with vascular surgery 12/18 with takeback to the OR for left groin debridement, muscle flap, wound VAC exchange 12/20, wound vac exchange 12/23 continue antimicrobials per ID Pain control Vascular following, likely for another wound vac exchange in the OR Mon 12/30   PAD s/p of left common femoral-tibioperoneal trunk bypass, thrombectomy, angioplasty and stent placement 04/2023  on aspirin, Plavix and Eliquis  Continue eliquis and aspirin Continue statin plavix is being held. Can likely resume once no further plans for OR    Leukocytosis  In the setting of her infection and multiple trips to the OR.   Monitor CBC Per ID consider transition to po abx once WBC improving    Normocytic anemia Expected ABLA d/t surgeries normal hemoglobin prior to vascular procedures.   some blood tinge output in her wound VAC S/p 1 unit of packed red blood cells by vascular team.  Can discharge on iron supplemntation  Continue to monitor CBC   AKI - resolved Likely d/t septic shock resolved Monitor BMP esp w/ restart diuretics   Hypokalemia D/t Lasix  Replace as needed Monitor BMP   PVC Patient noted to have frequent PVCs on tele This is likely causing her heart rate to appear to be lower than normal.  Will ensure potassium >4, Magnesium>2 Continue to monitor   Hyponatremia  Resolved monitor with improving PO intake.  Chronic diastolic HF Edema bilateral LE Monitor fluid status  Diuresis prn - giving another dose IV lasix today  Monitor BMP w/ diuresis  Hypoalbuminemia  Encourage po intake   HTN  HLD Continue statin Hold home antihypertensives. Mostly normotensive off of  midodrine Continue to monitor blood pressure   T2DM  A1c 8 Continue long-acting 15 units Continue mealtime 3 units 3 times daily      DVT prophylaxis: ELiquis  IV fluids: no continuous IV fluids  Nutrition: carb modified  Central lines / invasive devices: wound vac   Code Status: FULL CODE ACP documentation reviewed:  HCPOA and living will are on file in [facility]  [facility] needs: home health  Barriers to dispo / significant pending items: wound vac in place, iv abx, plannign wound vac change in OR              Subjective / Brief ROS:  Patient reports doing well today LE edema is much better but still present, no SOB. No CP Pain controlled.  Denies new weakness.  Tolerating diet.   Family Communication: support person is at bedside on rounds      Objective Findings:  Vitals:   07/04/23 0719 07/04/23 1520 07/04/23 2120 07/05/23 0736  BP: (!) 119/45 (!) 128/55 (!) 121/42 (!) 118/47  Pulse: 66 70 71 63  Resp: 12 12 18 16   Temp:  98.4 F (36.9 C) 98.3 F (36.8 C) 97.8 F (36.6 C)  TempSrc:   Oral   SpO2: 98% 98% 96% 99%  Weight:      Height:        Intake/Output Summary (Last 24 hours) at 07/05/2023 1330 Last data filed at 07/05/2023 1200 Gross per 24 hour  Intake --  Output 3450 ml  Net -3450 ml    Filed Weights   07/02/23 0639 07/02/23 0712 07/04/23 0500  Weight: 73.2 kg 73.2 kg 75.8 kg    Examination:  Physical Exam Constitutional:      General: She is not in acute distress. Cardiovascular:     Rate and Rhythm: Normal rate and regular rhythm.  Pulmonary:     Effort: Pulmonary effort is normal.     Breath sounds: Normal breath sounds.  Musculoskeletal:     Right lower leg: Edema present.     Left lower leg: Edema present.  Neurological:     General: No focal deficit present.     Mental Status: She is alert and oriented to person, place, and time. Mental status is at baseline.  Psychiatric:        Mood and Affect: Mood normal.         Behavior: Behavior normal.          Scheduled Medications:   apixaban  2.5 mg Oral BID   aspirin  81 mg Oral QHS   atorvastatin  20 mg Oral Daily   Chlorhexidine Gluconate Cloth  6 each Topical Daily   cholecalciferol  2,000 Units Oral Daily   gabapentin  300 mg Oral BID   insulin aspart  0-15 Units Subcutaneous TID AC & HS   insulin aspart  3 Units Subcutaneous TID WC   insulin glargine-yfgn  15 Units Subcutaneous QHS   multivitamin with minerals  1 tablet Oral Daily   pantoprazole  40 mg Oral Daily   polyethylene glycol  17 g Oral Daily   potassium chloride  40 mEq Oral BID   senna  1 tablet Oral Daily    Continuous Infusions:  ampicillin-sulbactam (  UNASYN) IV 3 g (07/05/23 0900)    PRN Medications:  acetaminophen, ALPRAZolam, alum & mag hydroxide-simeth, fentaNYL (SUBLIMAZE) injection, ondansetron (ZOFRAN) IV, mouth rinse, oxyCODONE-acetaminophen  Antimicrobials from admission:  Anti-infectives (From admission, onward)    Start     Dose/Rate Route Frequency Ordered Stop   06/29/23 0620  ceFAZolin (ANCEF) IVPB 2g/100 mL premix        2 g 200 mL/hr over 30 Minutes Intravenous 30 min pre-op 06/29/23 0620 06/29/23 0747   06/26/23 1800  Ampicillin-Sulbactam (UNASYN) 3 g in sodium chloride 0.9 % 100 mL IVPB        3 g 200 mL/hr over 30 Minutes Intravenous Every 6 hours 06/26/23 1646     06/24/23 1322  gentamicin (GARAMYCIN) injection  Status:  Discontinued          As needed 06/24/23 1322 06/24/23 1347   06/24/23 1303  tobramycin (NEBCIN) powder  Status:  Discontinued          As needed 06/24/23 1303 06/24/23 1347   06/24/23 1303  vancomycin (VANCOCIN) powder  Status:  Discontinued          As needed 06/24/23 1304 06/24/23 1347   06/24/23 1200  vancomycin (VANCOREADY) IVPB 1250 mg/250 mL  Status:  Discontinued       Placed in "Followed by" Linked Group   1,250 mg 166.7 mL/hr over 90 Minutes Intravenous Every 24 hours 06/23/23 1244 06/23/23 1651   06/24/23 1000   DAPTOmycin (CUBICIN) IVPB 500 mg/25mL premix  Status:  Discontinued        8 mg/kg  64.1 kg 100 mL/hr over 30 Minutes Intravenous Daily 06/23/23 1651 06/26/23 1646   06/24/23 0234  ceFAZolin (ANCEF) IVPB 2g/100 mL premix        2 g 200 mL/hr over 30 Minutes Intravenous 30 min pre-op 06/24/23 0234 06/24/23 1300   06/23/23 1800  ceFEPIme (MAXIPIME) 2 g in sodium chloride 0.9 % 100 mL IVPB  Status:  Discontinued        2 g 200 mL/hr over 30 Minutes Intravenous Every 8 hours 06/23/23 1242 06/26/23 1646   06/23/23 1330  vancomycin (VANCOREADY) IVPB 1250 mg/250 mL  Status:  Discontinued       Placed in "Followed by" Linked Group   1,250 mg 166.7 mL/hr over 90 Minutes Intravenous Every 24 hours 06/23/23 1242 06/23/23 1244   06/23/23 1201  vancomycin (VANCOCIN) IVPB 1000 mg/200 mL premix  Status:  Discontinued       Placed in "Followed by" Linked Group   1,000 mg 200 mL/hr over 60 Minutes Intravenous Every 24 hours 06/22/23 0042 06/23/23 1242   06/22/23 2200  ceFEPIme (MAXIPIME) 2 g in sodium chloride 0.9 % 100 mL IVPB  Status:  Discontinued        2 g 200 mL/hr over 30 Minutes Intravenous Every 12 hours 06/22/23 2120 06/23/23 1242   06/22/23 0200  piperacillin-tazobactam (ZOSYN) IVPB 3.375 g  Status:  Discontinued        3.375 g 12.5 mL/hr over 240 Minutes Intravenous Every 8 hours 06/22/23 0042 06/22/23 2117   06/22/23 0045  vancomycin (VANCOCIN) IVPB 1000 mg/200 mL premix       Placed in "Followed by" Linked Group   1,000 mg 200 mL/hr over 60 Minutes Intravenous STAT 06/22/23 0042 06/22/23 0254           Data Reviewed:  I have personally reviewed the following...  CBC: Recent Labs  Lab 06/29/23 0359 06/30/23 0546 07/02/23 1644 07/03/23  0511  WBC 18.3* 15.2* 14.3* 12.4*  NEUTROABS  --   --  11.6*  --   HGB 8.4* 8.5* 9.6* 9.0*  HCT 24.3* 24.7* 28.8* 27.7*  MCV 87.7 87.0 89.2 90.2  PLT 348 383 431* 434*   Basic Metabolic Panel: Recent Labs  Lab 06/29/23 0359  06/30/23 0546 07/03/23 0511 07/04/23 0423 07/05/23 0432  NA 135 134* 136 134* 137  K 4.2 4.0 4.4 3.8 3.2*  CL 105 103 100 101 103  CO2 22 24 25 26 26   GLUCOSE 174* 141* 150* 85 132*  BUN 22 19 21 19 22   CREATININE 0.77 0.73 0.73 0.63 0.67  CALCIUM 8.0* 7.9* 7.9* 7.4* 7.3*  PHOS 3.2  --   --   --   --    GFR: Estimated Creatinine Clearance: 75.5 mL/min (by C-G formula based on SCr of 0.67 mg/dL). Liver Function Tests: Recent Labs  Lab 06/29/23 0359  ALBUMIN 1.8*   No results for input(s): "LIPASE", "AMYLASE" in the last 168 hours. No results for input(s): "AMMONIA" in the last 168 hours. Coagulation Profile: No results for input(s): "INR", "PROTIME" in the last 168 hours. Cardiac Enzymes: No results for input(s): "CKTOTAL", "CKMB", "CKMBINDEX", "TROPONINI" in the last 168 hours.  BNP (last 3 results) No results for input(s): "PROBNP" in the last 8760 hours. HbA1C: No results for input(s): "HGBA1C" in the last 72 hours. CBG: Recent Labs  Lab 07/04/23 1155 07/04/23 1624 07/04/23 2121 07/05/23 0732 07/05/23 1210  GLUCAP 87 121* 185* 122* 125*   Lipid Profile: No results for input(s): "CHOL", "HDL", "LDLCALC", "TRIG", "CHOLHDL", "LDLDIRECT" in the last 72 hours. Thyroid Function Tests: No results for input(s): "TSH", "T4TOTAL", "FREET4", "T3FREE", "THYROIDAB" in the last 72 hours. Anemia Panel: No results for input(s): "VITAMINB12", "FOLATE", "FERRITIN", "TIBC", "IRON", "RETICCTPCT" in the last 72 hours.  Most Recent Urinalysis On File:     Component Value Date/Time   COLORURINE YELLOW (A) 05/11/2023 1317   APPEARANCEUR TURBID (A) 05/11/2023 1317   APPEARANCEUR Hazy (A) 08/12/2022 1430   LABSPEC 1.019 05/11/2023 1317   LABSPEC 1.010 01/09/2014 1108   PHURINE 5.0 05/11/2023 1317   GLUCOSEU NEGATIVE 05/11/2023 1317   GLUCOSEU Negative 01/09/2014 1108   HGBUR LARGE (A) 05/11/2023 1317   BILIRUBINUR NEGATIVE 05/11/2023 1317   BILIRUBINUR Negative 08/12/2022 1430    BILIRUBINUR Negative 01/09/2014 1108   KETONESUR 5 (A) 05/11/2023 1317   PROTEINUR 100 (A) 05/11/2023 1317   UROBILINOGEN 0.2 04/09/2016 1159   NITRITE POSITIVE (A) 05/11/2023 1317   LEUKOCYTESUR MODERATE (A) 05/11/2023 1317   LEUKOCYTESUR 3+ 01/09/2014 1108   Sepsis Labs: @LABRCNTIP (procalcitonin:4,lacticidven:4) Microbiology: Recent Results (from the past 240 hours)  Aerobic/Anaerobic Culture w Gram Stain (surgical/deep wound)     Status: None (Preliminary result)   Collection Time: 07/02/23  8:01 AM   Specimen: Wound; Abscess  Result Value Ref Range Status   Specimen Description   Final    WOUND Performed at [facility], [address], [place], [place] [address]    Special Requests   Final    NONE Performed at [facility], [address] [place], [place] [address]    Gram Stain   Final    RARE WBC PRESENT, PREDOMINANTLY PMN NO ORGANISMS SEEN    Culture   Final    NO GROWTH 3 DAYS NO ANAEROBES ISOLATED; CULTURE IN PROGRESS FOR 5 DAYS Performed at [facility], [address] [place], [place] [address]    Report Status  PENDING  Incomplete      Radiology Studies last 3 days: No results found.      Sunnie Nielsen, DO [facility] 07/05/2023, 1:30 PM    Dictation software may have been used to generate the above note. Typos may occur and escape review in typed/dictated notes. Please contact Dr Lyn Hollingshead directly for clarity if needed.  Staff may message me via secure chat in Epic  but this may not receive an immediate response,  please page me for urgent matters!  If 7PM-7AM, please contact night coverage [email or web address]

## 2023-07-05 NOTE — H&P (View-Only) (Signed)
3 Days Post-Op   Subjective/Chief Complaint: Doing OK. Pain controlled with current regimen.Up in chair this morning.   Objective: Vital signs in last 24 hours: Temp:  [97.8 F (36.6 C)-98.4 F (36.9 C)] 97.8 F (36.6 C) (12/29 0736) Pulse Rate:  [63-71] 63 (12/29 0736) Resp:  [12-18] 16 (12/29 0736) BP: (118-128)/(42-55) 118/47 (12/29 0736) SpO2:  [96 %-99 %] 99 % (12/29 0736) Last BM Date : 07/04/23  Intake/Output from previous day: 12/28 0701 - 12/29 0700 In: -  Out: 2200 [Urine:2200] Intake/Output this shift: No intake/output data recorded.  General appearance: alert and no distress Cardio: regular rate and rhythm Extremities: LEFT groin- VAC in place with good seal/suction; scant serous drainage in canister, leg/foot warm  Lab Results:  Recent Labs    07/02/23 1644 07/03/23 0511  WBC 14.3* 12.4*  HGB 9.6* 9.0*  HCT 28.8* 27.7*  PLT 431* 434*   BMET Recent Labs    07/04/23 0423 07/05/23 0432  NA 134* 137  K 3.8 3.2*  CL 101 103  CO2 26 26  GLUCOSE 85 132*  BUN 19 22  CREATININE 0.63 0.67  CALCIUM 7.4* 7.3*   PT/INR No results for input(s): "LABPROT", "INR" in the last 72 hours. ABG No results for input(s): "PHART", "HCO3" in the last 72 hours.  Invalid input(s): "PCO2", "PO2"  Studies/Results: No results found.  Anti-infectives: Anti-infectives (From admission, onward)    Start     Dose/Rate Route Frequency Ordered Stop   06/29/23 0620  ceFAZolin (ANCEF) IVPB 2g/100 mL premix        2 g 200 mL/hr over 30 Minutes Intravenous 30 min pre-op 06/29/23 0620 06/29/23 0747   06/26/23 1800  Ampicillin-Sulbactam (UNASYN) 3 g in sodium chloride 0.9 % 100 mL IVPB        3 g 200 mL/hr over 30 Minutes Intravenous Every 6 hours 06/26/23 1646     06/24/23 1322  gentamicin (GARAMYCIN) injection  Status:  Discontinued          As needed 06/24/23 1322 06/24/23 1347   06/24/23 1303  tobramycin (NEBCIN) powder  Status:  Discontinued          As needed  06/24/23 1303 06/24/23 1347   06/24/23 1303  vancomycin (VANCOCIN) powder  Status:  Discontinued          As needed 06/24/23 1304 06/24/23 1347   06/24/23 1200  vancomycin (VANCOREADY) IVPB 1250 mg/250 mL  Status:  Discontinued       Placed in "Followed by" Linked Group   1,250 mg 166.7 mL/hr over 90 Minutes Intravenous Every 24 hours 06/23/23 1244 06/23/23 1651   06/24/23 1000  DAPTOmycin (CUBICIN) IVPB 500 mg/27mL premix  Status:  Discontinued        8 mg/kg  64.1 kg 100 mL/hr over 30 Minutes Intravenous Daily 06/23/23 1651 06/26/23 1646   06/24/23 0234  ceFAZolin (ANCEF) IVPB 2g/100 mL premix        2 g 200 mL/hr over 30 Minutes Intravenous 30 min pre-op 06/24/23 0234 06/24/23 1300   06/23/23 1800  ceFEPIme (MAXIPIME) 2 g in sodium chloride 0.9 % 100 mL IVPB  Status:  Discontinued        2 g 200 mL/hr over 30 Minutes Intravenous Every 8 hours 06/23/23 1242 06/26/23 1646   06/23/23 1330  vancomycin (VANCOREADY) IVPB 1250 mg/250 mL  Status:  Discontinued       Placed in "Followed by" Linked Group   1,250 mg 166.7 mL/hr over 90  Minutes Intravenous Every 24 hours 06/23/23 1242 06/23/23 1244   06/23/23 1201  vancomycin (VANCOCIN) IVPB 1000 mg/200 mL premix  Status:  Discontinued       Placed in "Followed by" Linked Group   1,000 mg 200 mL/hr over 60 Minutes Intravenous Every 24 hours 06/22/23 0042 06/23/23 1242   06/22/23 2200  ceFEPIme (MAXIPIME) 2 g in sodium chloride 0.9 % 100 mL IVPB  Status:  Discontinued        2 g 200 mL/hr over 30 Minutes Intravenous Every 12 hours 06/22/23 2120 06/23/23 1242   06/22/23 0200  piperacillin-tazobactam (ZOSYN) IVPB 3.375 g  Status:  Discontinued        3.375 g 12.5 mL/hr over 240 Minutes Intravenous Every 8 hours 06/22/23 0042 06/22/23 2117   06/22/23 0045  vancomycin (VANCOCIN) IVPB 1000 mg/200 mL premix       Placed in "Followed by" Linked Group   1,000 mg 200 mL/hr over 60 Minutes Intravenous STAT 06/22/23 0042 06/22/23 0254        Assessment/Plan: s/p Procedure(s): WOUND VAC EXCHANGE LEFT GROIN (Left) FEM-FEM; Fem-Pop with prosthetic- Left groin wound/abscess now s/p serial debridements and washouts   Will proceed with LEFT groin debridement and VAC change tomorrow All questions answered. Understanding expressed. Consent obtained. NPO after MN   LOS: 14 days    Laura Reed 07/05/2023

## 2023-07-05 NOTE — Progress Notes (Signed)
3 Days Post-Op   Subjective/Chief Complaint: Doing OK. Pain controlled with current regimen.Up in chair this morning.   Objective: Vital signs in last 24 hours: Temp:  [97.8 F (36.6 C)-98.4 F (36.9 C)] 97.8 F (36.6 C) (12/29 0736) Pulse Rate:  [63-71] 63 (12/29 0736) Resp:  [12-18] 16 (12/29 0736) BP: (118-128)/(42-55) 118/47 (12/29 0736) SpO2:  [96 %-99 %] 99 % (12/29 0736) Last BM Date : 07/04/23  Intake/Output from previous day: 12/28 0701 - 12/29 0700 In: -  Out: 2200 [Urine:2200] Intake/Output this shift: No intake/output data recorded.  General appearance: alert and no distress Cardio: regular rate and rhythm Extremities: LEFT groin- VAC in place with good seal/suction; scant serous drainage in canister, leg/foot warm  Lab Results:  Recent Labs    07/02/23 1644 07/03/23 0511  WBC 14.3* 12.4*  HGB 9.6* 9.0*  HCT 28.8* 27.7*  PLT 431* 434*   BMET Recent Labs    07/04/23 0423 07/05/23 0432  NA 134* 137  K 3.8 3.2*  CL 101 103  CO2 26 26  GLUCOSE 85 132*  BUN 19 22  CREATININE 0.63 0.67  CALCIUM 7.4* 7.3*   PT/INR No results for input(s): "LABPROT", "INR" in the last 72 hours. ABG No results for input(s): "PHART", "HCO3" in the last 72 hours.  Invalid input(s): "PCO2", "PO2"  Studies/Results: No results found.  Anti-infectives: Anti-infectives (From admission, onward)    Start     Dose/Rate Route Frequency Ordered Stop   06/29/23 0620  ceFAZolin (ANCEF) IVPB 2g/100 mL premix        2 g 200 mL/hr over 30 Minutes Intravenous 30 min pre-op 06/29/23 0620 06/29/23 0747   06/26/23 1800  Ampicillin-Sulbactam (UNASYN) 3 g in sodium chloride 0.9 % 100 mL IVPB        3 g 200 mL/hr over 30 Minutes Intravenous Every 6 hours 06/26/23 1646     06/24/23 1322  gentamicin (GARAMYCIN) injection  Status:  Discontinued          As needed 06/24/23 1322 06/24/23 1347   06/24/23 1303  tobramycin (NEBCIN) powder  Status:  Discontinued          As needed  06/24/23 1303 06/24/23 1347   06/24/23 1303  vancomycin (VANCOCIN) powder  Status:  Discontinued          As needed 06/24/23 1304 06/24/23 1347   06/24/23 1200  vancomycin (VANCOREADY) IVPB 1250 mg/250 mL  Status:  Discontinued       Placed in "Followed by" Linked Group   1,250 mg 166.7 mL/hr over 90 Minutes Intravenous Every 24 hours 06/23/23 1244 06/23/23 1651   06/24/23 1000  DAPTOmycin (CUBICIN) IVPB 500 mg/27mL premix  Status:  Discontinued        8 mg/kg  64.1 kg 100 mL/hr over 30 Minutes Intravenous Daily 06/23/23 1651 06/26/23 1646   06/24/23 0234  ceFAZolin (ANCEF) IVPB 2g/100 mL premix        2 g 200 mL/hr over 30 Minutes Intravenous 30 min pre-op 06/24/23 0234 06/24/23 1300   06/23/23 1800  ceFEPIme (MAXIPIME) 2 g in sodium chloride 0.9 % 100 mL IVPB  Status:  Discontinued        2 g 200 mL/hr over 30 Minutes Intravenous Every 8 hours 06/23/23 1242 06/26/23 1646   06/23/23 1330  vancomycin (VANCOREADY) IVPB 1250 mg/250 mL  Status:  Discontinued       Placed in "Followed by" Linked Group   1,250 mg 166.7 mL/hr over 90  Minutes Intravenous Every 24 hours 06/23/23 1242 06/23/23 1244   06/23/23 1201  vancomycin (VANCOCIN) IVPB 1000 mg/200 mL premix  Status:  Discontinued       Placed in "Followed by" Linked Group   1,000 mg 200 mL/hr over 60 Minutes Intravenous Every 24 hours 06/22/23 0042 06/23/23 1242   06/22/23 2200  ceFEPIme (MAXIPIME) 2 g in sodium chloride 0.9 % 100 mL IVPB  Status:  Discontinued        2 g 200 mL/hr over 30 Minutes Intravenous Every 12 hours 06/22/23 2120 06/23/23 1242   06/22/23 0200  piperacillin-tazobactam (ZOSYN) IVPB 3.375 g  Status:  Discontinued        3.375 g 12.5 mL/hr over 240 Minutes Intravenous Every 8 hours 06/22/23 0042 06/22/23 2117   06/22/23 0045  vancomycin (VANCOCIN) IVPB 1000 mg/200 mL premix       Placed in "Followed by" Linked Group   1,000 mg 200 mL/hr over 60 Minutes Intravenous STAT 06/22/23 0042 06/22/23 0254        Assessment/Plan: s/p Procedure(s): WOUND VAC EXCHANGE LEFT GROIN (Left) FEM-FEM; Fem-Pop with prosthetic- Left groin wound/abscess now s/p serial debridements and washouts   Will proceed with LEFT groin debridement and VAC change tomorrow All questions answered. Understanding expressed. Consent obtained. NPO after MN   LOS: 14 days    Eli Hose A 07/05/2023

## 2023-07-05 NOTE — Plan of Care (Signed)

## 2023-07-06 ENCOUNTER — Other Ambulatory Visit: Payer: Self-pay

## 2023-07-06 ENCOUNTER — Encounter
Admission: EM | Disposition: A | Discharge status: Home Health | Payer: Self-pay | Source: Home / Self Care | Attending: Internal Medicine

## 2023-07-06 ENCOUNTER — Inpatient Hospital Stay: Payer: 59 | Admitting: Registered Nurse

## 2023-07-06 DIAGNOSIS — M79605 Pain in left leg: Secondary | ICD-10-CM | POA: Diagnosis not present

## 2023-07-06 DIAGNOSIS — T827XXA Infection and inflammatory reaction due to other cardiac and vascular devices, implants and grafts, initial encounter: Secondary | ICD-10-CM | POA: Diagnosis not present

## 2023-07-06 DIAGNOSIS — D72829 Elevated white blood cell count, unspecified: Secondary | ICD-10-CM | POA: Diagnosis not present

## 2023-07-06 DIAGNOSIS — I739 Peripheral vascular disease, unspecified: Secondary | ICD-10-CM | POA: Diagnosis not present

## 2023-07-06 DIAGNOSIS — L02214 Cutaneous abscess of groin: Secondary | ICD-10-CM | POA: Diagnosis not present

## 2023-07-06 HISTORY — PX: APPLICATION OF WOUND VAC: SHX5189

## 2023-07-06 LAB — BASIC METABOLIC PANEL
Anion gap: 7 (ref 5–15)
BUN: 23 mg/dL (ref 8–23)
CO2: 28 mmol/L (ref 22–32)
Calcium: 7.5 mg/dL — ABNORMAL LOW (ref 8.9–10.3)
Chloride: 103 mmol/L (ref 98–111)
Creatinine, Ser: 0.74 mg/dL (ref 0.44–1.00)
GFR, Estimated: >60 mL/min (ref >60)
Glucose, Bld: 113 mg/dL — ABNORMAL HIGH (ref 70–99)
Potassium: 4.2 mmol/L (ref 3.5–5.1)
Sodium: 138 mmol/L (ref 135–145)

## 2023-07-06 LAB — GLUCOSE, CAPILLARY
Glucose-Capillary: 99 mg/dL (ref 70–99)
Glucose-Capillary: 97 mg/dL (ref 70–99)
Glucose-Capillary: 106 mg/dL — ABNORMAL HIGH (ref 70–99)
Glucose-Capillary: 95 mg/dL (ref 70–99)
Glucose-Capillary: 239 mg/dL — ABNORMAL HIGH (ref 70–99)

## 2023-07-06 SURGERY — APPLICATION, WOUND VAC
Anesthesia: General | Laterality: Left

## 2023-07-06 MED ORDER — SODIUM CHLORIDE 0.9 % IV SOLN: INTRAVENOUS | Status: DC | PRN

## 2023-07-06 MED ORDER — MIDAZOLAM HCL 2 MG/2ML IJ SOLN
INTRAMUSCULAR | Status: AC
  Filled 2023-07-06: qty 2

## 2023-07-06 MED ORDER — GENTAMICIN SULFATE 40 MG/ML IJ SOLN
INTRAMUSCULAR | Status: DC | PRN
  Administered 2023-07-06: 80 mg

## 2023-07-06 MED ORDER — 0.9 % SODIUM CHLORIDE (POUR BTL) OPTIME
TOPICAL | Status: DC | PRN
  Administered 2023-07-06: 1000 mL

## 2023-07-06 MED ORDER — FENTANYL CITRATE (PF) 100 MCG/2ML IJ SOLN
INTRAMUSCULAR | Status: DC | PRN
  Administered 2023-07-06: 50 ug via INTRAVENOUS

## 2023-07-06 MED ORDER — PROPOFOL 10 MG/ML IV BOLUS
INTRAVENOUS | Status: DC | PRN
  Administered 2023-07-06: 30 mg via INTRAVENOUS
  Administered 2023-07-06: 25 ug/kg/min via INTRAVENOUS
  Administered 2023-07-06: 30 mg via INTRAVENOUS

## 2023-07-06 MED ORDER — GENTAMICIN SULFATE 40 MG/ML IJ SOLN
INTRAMUSCULAR | Status: AC
  Filled 2023-07-06: qty 2

## 2023-07-06 MED ORDER — PROPOFOL 10 MG/ML IV BOLUS
INTRAVENOUS | Status: AC
  Filled 2023-07-06: qty 60

## 2023-07-06 MED ORDER — FENTANYL CITRATE (PF) 100 MCG/2ML IJ SOLN
INTRAMUSCULAR | Status: AC
  Filled 2023-07-06: qty 2

## 2023-07-06 MED ORDER — MIDAZOLAM HCL 2 MG/2ML IJ SOLN
INTRAMUSCULAR | Status: DC | PRN
  Administered 2023-07-06: 2 mg via INTRAVENOUS

## 2023-07-06 SURGICAL SUPPLY — 32 items
BNDG COHESIVE 6X5 TAN ST LF (GAUZE/BANDAGES/DRESSINGS) ×1 IMPLANT
CANISTER WOUND CARE 500ML ATS (WOUND CARE) IMPLANT
CHLORAPREP W/TINT 26 (MISCELLANEOUS) ×1 IMPLANT
DRAPE EXTREMITY 106X87X128.5 (DRAPES) IMPLANT
DRAPE INCISE IOBAN 66X45 STRL (DRAPES) IMPLANT
DRSG EMULSION OIL 3X3 NADH (GAUZE/BANDAGES/DRESSINGS) IMPLANT
DRSG EMULSION OIL 3X8 NADH (GAUZE/BANDAGES/DRESSINGS) IMPLANT
DRSG VAC GRANUFOAM LG (GAUZE/BANDAGES/DRESSINGS) IMPLANT
DRSG VAC GRANUFOAM MED (GAUZE/BANDAGES/DRESSINGS) IMPLANT
ELECT REM PT RETURN 9FT ADLT (ELECTROSURGICAL) ×1
ELECTRODE REM PT RTRN 9FT ADLT (ELECTROSURGICAL) ×1 IMPLANT
GAUZE SPONGE 4X4 12PLY STRL (GAUZE/BANDAGES/DRESSINGS) IMPLANT
GAUZE STRETCH 2X75IN STRL (MISCELLANEOUS) IMPLANT
GLOVE BIO SURGEON STRL SZ7 (GLOVE) ×1 IMPLANT
GLOVE SURG SYN 8.0 (GLOVE) ×1 IMPLANT
GLOVE SURG SYN 8.0 PF PI (GLOVE) ×1 IMPLANT
GOWN STRL REUS W/ TWL LRG LVL3 (GOWN DISPOSABLE) ×2 IMPLANT
GOWN STRL REUS W/ TWL XL LVL3 (GOWN DISPOSABLE) ×1 IMPLANT
GRAFT SKIN WND MESHED 7X10 (Tissue) IMPLANT
GRAFT SKIN WND MICRO 38 (Tissue) IMPLANT
KIT STIMULAN RAPID CURE 5CC (Orthopedic Implant) IMPLANT
KIT TURNOVER KIT A (KITS) ×1 IMPLANT
LABEL OR SOLS (LABEL) ×1 IMPLANT
MANIFOLD NEPTUNE II (INSTRUMENTS) ×1 IMPLANT
NS IRRIG 500ML POUR BTL (IV SOLUTION) ×1 IMPLANT
PACK EXTREMITY ARMC (MISCELLANEOUS) ×1 IMPLANT
PAD PREP OB/GYN DISP 24X41 (PERSONAL CARE ITEMS) ×1 IMPLANT
SOL PREP PVP 2OZ (MISCELLANEOUS) ×1
SOLUTION PREP PVP 2OZ (MISCELLANEOUS) ×1 IMPLANT
STOCKINETTE IMPERV 14X48 (MISCELLANEOUS) ×1 IMPLANT
TRAP FLUID SMOKE EVACUATOR (MISCELLANEOUS) ×1 IMPLANT
WATER STERILE IRR 500ML POUR (IV SOLUTION) ×1 IMPLANT

## 2023-07-06 NOTE — Anesthesia Preprocedure Evaluation (Signed)
 Anesthesia Evaluation  Patient identified by MRN, date of birth, ID band Patient awake    Reviewed: Allergy & Precautions, NPO status , Patient's Chart, lab work & pertinent test results  History of Anesthesia Complications Negative for: history of anesthetic complications  Airway Mallampati: III  TM Distance: >3 FB Neck ROM: full    Dental no notable dental hx. (+) Edentulous Upper, Edentulous Lower   Pulmonary neg COPD, neg recent URI, former smoker   Pulmonary exam normal breath sounds clear to auscultation       Cardiovascular hypertension, On Medications (-) angina + CAD, + CABG, + Peripheral Vascular Disease and +CHF  (-) Cardiac Stents + dysrhythmias (rate controlled) Atrial Fibrillation + Valvular Problems/Murmurs  Rhythm:Regular Rate:Normal - Systolic murmurs    Neuro/Psych neg Seizures PSYCHIATRIC DISORDERS Anxiety      Neuromuscular disease    GI/Hepatic Neg liver ROS,GERD  Controlled,,  Endo/Other  diabetes, Insulin Dependent    Renal/GU Renal disease     Musculoskeletal  (+) Arthritis ,    Abdominal   Peds  Hematology negative hematology ROS (+)   Anesthesia Other Findings Past Medical History: No date: Absence of kidney     Comment:  left No date: Anxiety No date: Arthritis No date: Bladder cancer (HCC) No date: CHF (congestive heart failure) (HCC) No date: Complication of anesthesia     Comment:  BP HAS  RUN LOW AFTER SURGERY-LUNGS FILLED UP WITH FLUID              AFTER  LEG STENT SURGERY  No date: Coronary artery disease No date: Diabetes mellitus No date: Family history of adverse reaction to anesthesia     Comment:  Sister - PONV No date: GERD (gastroesophageal reflux disease)     Comment:  OCC TUMS No date: Heart murmur No date: Hemorrhoid 2007: History of methicillin resistant staphylococcus aureus (MRSA) No date: Hypertension No date: Neuropathy No date: PVD (peripheral vascular  disease) (HCC) No date: Thyroid nodule     Comment:  right 10/31/2014: Urothelial carcinoma of kidney (HCC)     Comment:  INVASIVE UROTHELIAL CARCINOMA, LOW GRADE. T1, Nx. No date: Wears dentures     Comment:  full upper and lower  Past Surgical History: No date: AMPUTATION TOE     Comment:  right (4th and 5th); left (great toe, 3rd) 07/16/2018: AMPUTATION TOE; Right     Comment:  Procedure: AMPUTATION TOE/MPJ right 2nd;  Surgeon:               Linus Galas, DPM;  Location: [facility];  Service:               Podiatry;  Laterality: Right; 2009, 2013 x 2: ARTERIAL BYPASS SURGRY     Comment:  right leg , done in [place] No date: CARDIAC CATHETERIZATION 01/2014: CAROTID ENDARTERECTOMY; Right     Comment:  Dr Gilda Crease 12/14/2014: CATARACT EXTRACTION W/PHACO; Right     Comment:  Procedure: CATARACT EXTRACTION PHACO AND INTRAOCULAR               LENS PLACEMENT (IOC);  Surgeon: Lia Hopping, MD;                Location: [facility];  Service: Ophthalmology;  Laterality:              Right;  [place]   00:38.6              AP        7.1  CDE  2.76 12/06/2019: CATARACT EXTRACTION W/PHACO; Left     Comment:  Procedure: CATARACT EXTRACTION PHACO AND INTRAOCULAR               LENS PLACEMENT (IOC) LEFT DIABETIC;  Surgeon: Galen Manila, MD;  Location: [facility];  Service:               Ophthalmology;  Laterality: Left;  9.08 1:06.4 No date: CESAREAN SECTION 03-03-12: CHOLECYSTECTOMY     Comment:  Porcelain gallbladder, gallstones,  [place] 04/28/2012: COLONOSCOPY W/ BIOPSIES     Comment:  Hyperplastic rectal polyps. 04/02/2022: COLONOSCOPY WITH PROPOFOL; N/A     Comment:  Procedure: COLONOSCOPY WITH PROPOFOL;  Surgeon: Earline Mayotte, MD;  Location: [facility];  Service:               Endoscopy;  Laterality: N/A; 2009: CORONARY ARTERY BYPASS GRAFT     Comment:  3 vessel 09/01/2016: CYSTOSCOPY W/ RETROGRADES; Right     Comment:   Procedure: CYSTOSCOPY WITH RETROGRADE PYELOGRAM;                Surgeon: Vanna Scotland, MD;  Location: [facility];                Service: Urology;  Laterality: Right; 03/19/2020: CYSTOSCOPY W/ RETROGRADES; Bilateral     Comment:  Procedure: CYSTOSCOPY WITH RETROGRADE PYELOGRAM;                Surgeon: Vanna Scotland, MD;  Location: [facility];                Service: Urology;  Laterality: Bilateral; 03/19/2020: CYSTOSCOPY WITH BIOPSY; N/A     Comment:  Procedure: CYSTOSCOPY WITH BIOPSY;  Surgeon: Vanna Scotland, MD;  Location: [facility];  Service: Urology;                Laterality: N/A; No date: EYE SURGERY 10-31-14: HERNIA REPAIR     Comment:  ventral, retro-rectus atrium mesh 01/18/2019: IRRIGATION AND DEBRIDEMENT FOOT; Left     Comment:  Procedure: IRRIGATION AND DEBRIDEMENT FOOT;  Surgeon:               Linus Galas, DPM;  Location: [facility];  Service:               Podiatry;  Laterality: Left; 12/10/2016: LOWER EXTREMITY ANGIOGRAPHY; Left     Comment:  Procedure: Lower Extremity Angiography;  Surgeon:               Renford Dills, MD;  Location: [facility];               Service: Cardiovascular;  Laterality: Left; 02/02/2018: LOWER EXTREMITY ANGIOGRAPHY; Left     Comment:  Procedure: LOWER EXTREMITY ANGIOGRAPHY;  Surgeon:               Renford Dills, MD;  Location: [facility];               Service: Cardiovascular;  Laterality: Left; 05/05/2018: LOWER EXTREMITY ANGIOGRAPHY; Left     Comment:  Procedure: LOWER EXTREMITY ANGIOGRAPHY;  Surgeon:               Renford Dills, MD;  Location: [facility] INVASIVE  CV LAB;               Service: Cardiovascular;  Laterality: Left; 12/04/2020: LOWER EXTREMITY ANGIOGRAPHY; Left     Comment:  Procedure: LOWER EXTREMITY ANGIOGRAPHY with               Intervention;  Surgeon: Renford Dills, MD;                Location: [facility];  Service: Cardiovascular;              Laterality:  Left; 04/24/2023: LOWER EXTREMITY ANGIOGRAPHY; Left     Comment:  Procedure: Lower Extremity Angiography;  Surgeon:               Renford Dills, MD;  Location: [facility];               Service: Cardiovascular;  Laterality: Left; 10-31-14: NEPHRECTOMY; Left 05/01/2015: PERIPHERAL VASCULAR CATHETERIZATION; Left     Comment:  Procedure: Lower Extremity Angiography;  Surgeon:               Renford Dills, MD;  Location: [facility];                Service: Cardiovascular;  Laterality: Left; 05/01/2015: PERIPHERAL VASCULAR CATHETERIZATION     Comment:  Procedure: Lower Extremity Intervention;  Surgeon:               Renford Dills, MD;  Location: [facility];                Service: Cardiovascular;; 02/20/2015: PERIPHERAL VASCULAR CATHETERIZATION; Left     Comment:  Procedure: Pelvic Angiography;  Surgeon: Renford Dills, MD;  Location: [facility] INV[facility];  Service:               Cardiovascular;  Laterality: Left; 09/01/2016: TRANSURETHRAL RESECTION OF BLADDER TUMOR WITH MITOMYCIN-C;  N/A     Comment:  Procedure: TRANSURETHRAL RESECTION OF BLADDER TUMOR WITH              MITOMYCIN-C;  Surgeon: Vanna Scotland, MD;  Location:               [facility];  Service: Urology;  Laterality: N/A;  BMI    Body Mass Index: 21.13 kg/m      Reproductive/Obstetrics negative OB ROS                              Anesthesia Physical Anesthesia Plan  ASA: 3  Anesthesia Plan: General   Post-op Pain Management: Minimal or no pain anticipated, Gabapentin PO (pre-op)* and Ofirmev IV (intra-op)*   Induction: Intravenous  PONV Risk Score and Plan: 2 and Propofol infusion, TIVA, Ondansetron, Midazolam and Dexamethasone  Airway Management Planned: Nasal Cannula and Natural Airway  Additional Equipment: None  Intra-op Plan:   Post-operative Plan:   Informed Consent: I have reviewed the patients History and Physical,  chart, labs and discussed the procedure including the risks, benefits and alternatives for the proposed anesthesia with the patient or authorized representative who has indicated his/her understanding and acceptance.     Dental advisory given  Plan Discussed with: CRNA and Surgeon  Anesthesia Plan Comments: (Discussed risks of anesthesia with patient, including possibility of difficulty with spontaneous ventilation under anesthesia necessitating airway intervention, PONV, and rare  risks such as cardiac or respiratory or neurological events, and allergic reactions. Discussed the role of CRNA in patient's perioperative care. Patient understands.)         Anesthesia Quick Evaluation

## 2023-07-06 NOTE — Consult Note (Signed)
[facility] Liaison Note  07/06/2023  [name] 16-Apr-1961 [id number]  Location: RN Hospital Liaison met patient at bedside at [facility].  Insurance: [employer]   Laura Reed is a 62 y.o. female who is a Primary Care Patient of [name], Mar Daring, MD with [facility] at [facility]. The patient was screened for  readmission hospitalization with noted medium risk score for unplanned readmission risk with 2 IP in 6 months.  The patient was assessed for potential Care Management service needs for post hospital transition for care coordination. Review of patient's electronic medical record reveals patient was admitted with left leg pain. Recommendations are for home with [facility] services. Liaison attempted a bedside visit pt on her phone. Liaison also attempted outreach call to the bedside however line was busy. At this time pt will be discharged with [facility] services post hospital discharge. Anticipate pt will receive a transition of care call from [facility] post hospital discharge.   Plan: [facility] Liaison will continue to follow progress and disposition to asess for post hospital community care coordination/management needs.  Referral request for community care coordination: anticipate Transitions of Care Team follow up.   [facility] Care Management/Population Health does not replace or interfere with any arrangements made by the Inpatient Transition of Care team.   For questions contact:   Elliot Cousin, RN, [facility] Liaison [place]   [facility], Population Health Office Hours MTWF  8:00 am-6:00 pm Direct Dial: [phone] mobile [phone] [Office toll free line] Office Hours are M-F 8:30 - 5 pm [email or web address]

## 2023-07-06 NOTE — Op Note (Signed)
    OPERATIVE NOTE   PROCEDURE: Debridement of left groin wound including skin subcutaneous tissue and muscle for approximately 112 cm. Application left groin wound of Kerecis Surgiclose using both micronized product as well as a 2-1 meshed fish skin graft. Application of a nondisposable left groin wound VAC  PRE-OPERATIVE DIAGNOSIS: Left groin wound status post drainage of an abscess  POST-OPERATIVE DIAGNOSIS: Same  SURGEON: Earl Lites Garlan Drewes  ASSISTANT(S): Rolla Plate, NP  ANESTHESIA: MAC  ESTIMATED BLOOD LOSS: 5 cc  FINDING(S): There were multiple areas of nonviable tissue but the muscle flap still does appear viable.  The femoral-femoral bypass appears to be covered today the femoral distal bypass did have 2 to 3 cm of exposure medial to the muscle flap.  SPECIMEN(S): Debrided tissue was not sent  INDICATIONS:   Laura Reed is a 62 y.o. female who presents with left groin wound.  She is undergoing serial debridements in the operating room with hope of salvaging her extensive vascular grafts.  DESCRIPTION: After full informed written consent was obtained from the patient, the patient was brought back to the operating room and placed supine upon the operating table.  Prior to induction of MAC anesthesia, the patient received IV antibiotics.   After obtaining adequate anesthesia, the patient was then prepped and draped in the standard fashion for an evaluation and treatment of the left groin wound.  The existing VAC dressing is removed and the groin is prepped with Betadine.  After appropriate timeout is called the wound is irrigated with a liter of saline.  Multiple areas of devitalized tissue are identified and these are treated with excisional debridement using Metzenbaum scissors.  Hemostasis is achieved with Bovie cautery.  Stimulant antibiotic beads using gentamicin are then created on the back table.  The wound is then measured and today I measure 16 cm in length by  approximately 7 cm in width and 4 cm in depth.  This equals 112 cm of surface area.  Kerecis microsurgery close is then mixed with a few drops of saline to form a paste like consistently and this is then placed in the deepest part of the wound overlying the graft allowing for coverage of the exposed portion of the FEM distal graft.  The Kerecis is mixed with the antibiotic beads.  Then a sheet of meshed fish skin graft is placed over the wound followed by an 8 Adaptic.  A medium black VAC sponges then trimmed to the appropriate shape and a VAC dressing is applied.  Excellent seal was noted in the operating room.   The patient tolerated this procedure well.   COMPLICATIONS: None  CONDITION: [name] [facility]  Office: [phone]   07/06/2023, 5:43 PM

## 2023-07-06 NOTE — Anesthesia Postprocedure Evaluation (Signed)
Anesthesia Post Note  Patient: Laura Reed  Procedure(s) Performed: WOUND VAC EXCHANGE LEFT GROIN (Left)  Patient location during evaluation: PACU Anesthesia Type: General Level of consciousness: awake and alert Pain management: pain level controlled Vital Signs Assessment: post-procedure vital signs reviewed and stable Respiratory status: spontaneous breathing, nonlabored ventilation, respiratory function stable and patient connected to nasal cannula oxygen Cardiovascular status: blood pressure returned to baseline and stable Postop Assessment: no apparent nausea or vomiting Anesthetic complications: no   No notable events documented.   Last Vitals:  Vitals:   07/06/23 0904 07/06/23 0915  BP:  (!) 138/53  Pulse: 67 60  Resp: 16 12  Temp:  (!) 36.3 C  SpO2: 92% 94%    Last Pain:  Vitals:   07/06/23 0915  TempSrc:   PainSc: 0-No pain                 Louie Boston

## 2023-07-06 NOTE — Interval H&P Note (Signed)
History and Physical Interval Note:  07/06/2023 7:25 AM  Laura Reed  has presented today for surgery, with the diagnosis of left groin infection.  The various methods of treatment have been discussed with the patient and family. After consideration of risks, benefits and other options for treatment, the patient has consented to  Procedure(s): WOUND VAC EXCHANGE LEFT GROIN (Left) as a surgical intervention.  The patient's history has been reviewed, patient examined, no change in status, stable for surgery.  I have reviewed the patient's chart and labs.  Questions were answered to the patient's satisfaction.     [name]

## 2023-07-06 NOTE — Plan of Care (Signed)

## 2023-07-06 NOTE — Progress Notes (Signed)
OT Cancellation Note  Patient Details Name: ZOEY LATCHFORD MRN: [id number] DOB: 20-Apr-1961   Cancelled Treatment:    Reason Eval/Treat Not Completed: Patient at procedure or test/ unavailable. Pt out of the room. Scheduled for wound vac exchange per chart. Will re-attempt OT tx at later date/time as pt is available.   Arman Filter., MPH, MS, OTR/L ascom [phone] 07/06/23, 8:16 AM

## 2023-07-06 NOTE — Progress Notes (Signed)
ID Pt is doing better Went for vac change and also had some debridement of non viable tissue Sister at bed side   O/e awake and alert Patient Vitals for the past 24 hrs:  BP Temp Temp src Pulse Resp SpO2 Height Weight  07/06/23 0915 (!) 138/53 (!) 97.4 F (36.3 C) -- 60 12 94 % -- --  07/06/23 0904 -- -- -- 67 16 92 % -- --  07/06/23 0900 (!) 130/46 -- -- 60 12 99 % -- --  07/06/23 0851 (!) 124/46 97.7 F (36.5 C) -- 62 15 93 % -- --  07/06/23 0745 (!) 133/49 98.2 F (36.8 C) Oral 68 18 93 % 5\' 7"  (1.702 m) --  07/06/23 0500 -- -- -- -- -- -- -- 68.8 kg  07/06/23 0018 (!) 111/43 99 F (37.2 C) -- 65 20 95 % -- --  07/05/23 1535 (!) 147/54 98 F (36.7 C) -- 74 16 98 % -- --   Chest CTA Hss1s2 Abd soft Left groin wound  picture reviewed     Edema legs- left > rt Left TMA  CNS non focal   Labs    Latest Ref Rng & Units 07/03/2023    5:11 AM 07/02/2023    4:44 PM 06/30/2023    5:46 AM  CBC  WBC 4.0 - 10.5 K/uL 12.4  14.3  15.2   Hemoglobin 12.0 - 15.0 g/dL 9.0  9.6  8.5   Hematocrit 36.0 - 46.0 % 27.7  28.8  24.7   Platelets 150 - 400 K/uL 434  431  383        Latest Ref Rng & Units 07/06/2023    4:58 AM 07/05/2023    4:32 AM 07/04/2023    4:23 AM  CMP  Glucose 70 - 99 mg/dL 161  096  85   BUN 8 - 23 mg/dL 23  22  19    Creatinine 0.44 - 1.00 mg/dL 0.45  4.09  8.11   Sodium 135 - 145 mmol/L 138  137  134   Potassium 3.5 - 5.1 mmol/L 4.2  3.2  3.8   Chloride 98 - 111 mmol/L 103  103  101   CO2 22 - 32 mmol/L 28  26  26    Calcium 8.9 - 10.3 mg/dL 7.5  7.3  7.4     Micro Groin abscess- Ecoli  Impression/recommendation PAD s/p left common femoral artery to tibioperoneal trunk artery bypass  with PTFE graft in OCT 2024   Abscess due to Ecoli  at the surgical site- CT showed  10 cm complex collection s/p excisional debridement of the abscess and rotation of a sarorius muscle flap for coverage of existing bypass grafts 06/24/23 Repeat culture from 12/26 is  negative   Day 14 of antibiotic Discussed with Dr. Gilda Crease . As vascular graft is exposed will treat like endovascular infection and give IV antibiotic for 4 weeks until 07/22/23 Unasyn while in hospital and ceftriaxone on discharge Leucocytosis-much improved    Left TMA- healed well  ecoli and enterococcus in  culture in sept when she had TMA   CAD s/p CABG   H/o bladder ca s/p left nephrectomy  Solitary kidney- avoid nephrotoxic drugs   AKI-  resolved   Discussed the management with the patient and vascular team

## 2023-07-06 NOTE — Progress Notes (Signed)
Progress Note   Patient: Laura Reed WUJ:[id number] DOB: [birth year] DOA: 06/21/2023     15 DOS: the patient was seen and examined on 07/06/2023   Brief hospital course:  HPI: 62 y.o female with significant PMH of HTN, HLD, DM, sCHF, CAD, PAD (s/p of left common femoral-distal bypass, thrombectomy, angioplasty and stent placement 04/2023 on aspirin, Plavix and Eliquis), anxiety, bladder cancer (s/p of nephrectomy). [facility] care on 06/21/23 with complaints of right groin pain, redness and oozing post op. Patient has hx of PAD and recently underwent left common femoral-distal bypass, thrombectomy, angioplasty and stent placement 04/2023 and right transmetatarsal amputation. SBP in the 50s and was sent to the ED for further evaluation.    Hospital course / significant events:  12/16: admitted to hospitalist service later transferred to ICU for pressor support.  12/18: to OR, 200 cc pus drained from L groin. ID consulted.  12/19: remains stable off pressors 12/20: to OR, left groin debridement, muscle flap, wound VAC exchange  12/23: wound vac exchange. Wound appears appropriate and flap appears viable. On Eliquis, ASA. Still holding Plavix and can probably resume once confirm no further plans for OR.  12/24-12/25: stable, planning repeat wound vac change 12/26 12/26: irrigation/debridement and vac change today.  12/27-12/29: await return to OR Sunday 12/29 for wound vac exchange. Diuresing for bilateral LE edema, Hx HFpEF     Consultants:  [facility] Vascular Surgery  Infectious disease    Procedures/Surgeries: 06/24/23: Excisional debridement of left groin abscess. Rotation of a sartorius muscle flap for coverage of the existing bypass grafts. Placement of a nondisposable wound VAC dressing left groin. Redo vascular surgery - Drs. [name] and Dew 06/26/23: Excisional debridement left groin wound. Placement of a nondisposable VAC dressing - Dr Gilda Crease 06/29/23: Left groin VAC dressing change  - Dr Gilda Crease   07/02/23: Irrigation and debridement of skin, soft tissue, and muscle for approximately 125 cm to the left groin wound, Pulse lavage irrigation left groin. Left groin VAC dressing change - Dr Wyn Quaker       ASSESSMENT & PLAN:   Left groin abscess Sepsis with septic shock resolved Status post I&D with vascular surgery 12/18 with takeback to the OR for left groin debridement, muscle flap, wound VAC exchange 12/20, wound vac exchange 12/23 continue antimicrobials per ID Pain control Vascular following, likely for another wound vac exchange in the OR Mon 12/30   PAD s/p of left common femoral-tibioperoneal trunk bypass, thrombectomy, angioplasty and stent placement 04/2023  on aspirin, Plavix and Eliquis  Continue eliquis and aspirin Continue statin plavix is being held. Can likely resume once no further plans for OR    Leukocytosis  In the setting of her infection and multiple trips to the OR.   Monitor CBC Per ID consider transition to po abx once WBC improving    Normocytic anemia Expected ABLA d/t surgeries normal hemoglobin prior to vascular procedures.   some blood tinge output in her wound VAC S/p 1 unit of packed red blood cells by vascular team.  Can discharge on iron supplemntation  Continue to monitor CBC   AKI - resolved Likely d/t septic shock resolved Monitor BMP esp w/ restart diuretics    Hypokalemia D/t Lasix  Replace as needed Monitor BMP   PVC Patient noted to have frequent PVCs on tele This is likely causing her heart rate to appear to be lower than normal.  Will ensure potassium >4, Magnesium>2 Continue to monitor   Hyponatremia  Resolved monitor with  improving PO intake.    Chronic diastolic HF Edema bilateral LE Monitor fluid status  Diuresis prn - giving another dose IV lasix today  Monitor BMP w/ diuresis   Hypoalbuminemia  Encourage po intake    HTN  HLD Continue statin Hold home antihypertensives. Mostly normotensive  off of midodrine Continue to monitor blood pressure   T2DM  A1c 8 Continue long-acting 15 units Continue mealtime 3 units 3 times daily      DVT prophylaxis: ELiquis  IV fluids: no continuous IV fluids  Nutrition: carb modified  Central lines / invasive devices: wound vac    Code Status: FULL CODE   TOC needs: home health  Barriers to dispo / significant pending items: wound vac in place, iv abx, plannign wound vac change in OR     Subjective / Brief ROS:  Patient seen and examined at bedside this morning Admits to improvement in the leg pain Underwent wound VAC change today Denies chest pain abdominal pain   Family Communication: support person is at bedside on rounds      Physical Exam Constitutional:      General: She is not in acute distress. Cardiovascular:     Rate and Rhythm: Normal rate and regular rhythm.  Pulmonary:     Effort: Pulmonary effort is normal.     Breath sounds: Normal breath sounds.  Musculoskeletal:     Right lower leg: Edema present.     Left lower leg: Edema present.  Neurological:     General: No focal deficit present.     Mental Status: She is alert and oriented to person, place, and time. Mental status is at baseline.  Psychiatric:        Mood and Affect: Mood normal.        Behavior: Behavior normal.     Data Reviewed:    Latest Ref Rng & Units 07/03/2023    5:11 AM 07/02/2023    4:44 PM 06/30/2023    5:46 AM  CBC  WBC 4.0 - 10.5 K/uL 12.4  14.3  15.2   Hemoglobin 12.0 - 15.0 g/dL 9.0  9.6  8.5   Hematocrit 36.0 - 46.0 % 27.7  28.8  24.7   Platelets 150 - 400 K/uL 434  431  383        Latest Ref Rng & Units 07/06/2023    4:58 AM 07/05/2023    4:32 AM 07/04/2023    4:23 AM  BMP  Glucose 70 - 99 mg/dL 161  096  85   BUN 8 - 23 mg/dL 23  22  19    Creatinine 0.44 - 1.00 mg/dL 0.45  4.09  8.11   Sodium 135 - 145 mmol/L 138  137  134   Potassium 3.5 - 5.1 mmol/L 4.2  3.2  3.8   Chloride 98 - 111 mmol/L 103  103  101   CO2  22 - 32 mmol/L 28  26  26    Calcium 8.9 - 10.3 mg/dL 7.5  7.3  7.4      Vitals:   07/06/23 0851 07/06/23 0900 07/06/23 0904 07/06/23 0915  BP: (!) 124/46 (!) 130/46  (!) 138/53  Pulse: 62 60 67 60  Resp: 15 12 16 12   Temp: 97.7 F (36.5 C)   (!) 97.4 F (36.3 C)  TempSrc:      SpO2: 93% 99% 92% 94%  Weight:      Height:         Time spent: 42  minutes  Author: Loyce Dys, MD 07/06/2023 3:35 PM  For on call review [email or web address].

## 2023-07-06 NOTE — Progress Notes (Signed)
Pt still with 2-3+ pitting edema bil LE.

## 2023-07-06 NOTE — Progress Notes (Signed)
Physical Therapy Treatment Patient Details Name: Laura Reed MRN: [id number] DOB: 27-Oct-1960 Today's Date: 07/06/2023   History of Present Illness Laura Reed  has presented today for surgery, with the diagnosis of Groin Infection. S/P I & D left groin. On April 29, 2023 she underwent a femoral to tibioperoneal trunk bypass using a PTFE distal flow graft.  She was noted to thrombose within the first 48 hours and subsequently underwent angiography with salvage of her bypass and stenting of the anterior tibial artery.  Following the second procedure which was May 01, 2023 she did well and was discharged to home approximately 10 days later. Wound vac change 12/23.    PT Comments  Up to chair with nursing this AM.  Ready to walk.  Stands and walks x 1 lap on unit with RW and supervision with assist to carry wound vac.  Generally steady with RW.  Stated she is walking with sister in hallways.  Overall progressing well.     If plan is discharge home, recommend the following: A little help with walking and/or transfers;A little help with bathing/dressing/bathroom;Assistance with cooking/housework;Help with stairs or ramp for entrance;Assist for transportation   Can travel by private vehicle        Equipment Recommendations  Rolling walker (2 wheels)    Recommendations for Other Services       Precautions / Restrictions Precautions Precautions: Fall Precaution Comments: wound vac groin/ post op shoe Restrictions Weight Bearing Restrictions Per Provider Order: No     Mobility  Bed Mobility Overal bed mobility: Modified Independent               Patient Response: Cooperative  Transfers Overall transfer level: Modified independent Equipment used: Rolling walker (2 wheels) Transfers: Sit to/from Stand Sit to Stand: Modified independent (Device/Increase time)                Ambulation/Gait Ambulation/Gait assistance: Supervision Gait Distance (Feet): 200  Feet Assistive device: Rolling walker (2 wheels) Gait Pattern/deviations: Step-to pattern, Antalgic Gait velocity: decreased     General Gait Details: Pt was able to ambulate with post op shoe + regular shoe on RLE   Stairs             Wheelchair Mobility     Tilt Bed Tilt Bed Patient Response: Cooperative  Modified Rankin (Stroke Patients Only)       Balance Overall balance assessment: Modified Independent                                          Cognition Arousal: Alert Behavior During Therapy: WFL for tasks assessed/performed Overall Cognitive Status: Within Functional Limits for tasks assessed                                          Exercises      General Comments        Pertinent Vitals/Pain Pain Assessment Pain Assessment: Faces Faces Pain Scale: Hurts a little bit Pain Location: L groin Pain Descriptors / Indicators: Discomfort, Sore Pain Intervention(s): Limited activity within patient's tolerance, Monitored during session, Repositioned    Home Living                          Prior Function  PT Goals (current goals can now be found in the care plan section) Progress towards PT goals: Progressing toward goals    Frequency    Min 1X/week      PT Plan      Co-evaluation              AM-PAC PT "6 Clicks" Mobility   Outcome Measure  Help needed turning from your back to your side while in a flat bed without using bedrails?: None Help needed moving from lying on your back to sitting on the side of a flat bed without using bedrails?: None Help needed moving to and from a bed to a chair (including a wheelchair)?: None Help needed standing up from a chair using your arms (e.g., wheelchair or bedside chair)?: None Help needed to walk in hospital room?: A Little Help needed climbing 3-5 steps with a railing? : A Little 6 Click Score: 22    End of Session Equipment  Utilized During Treatment: Gait belt Activity Tolerance: Patient tolerated treatment well;No increased pain;Patient limited by fatigue Patient left: in chair;with call bell/phone within reach Nurse Communication: Mobility status PT Visit Diagnosis: Other abnormalities of gait and mobility (R26.89);Unsteadiness on feet (R26.81);Pain;Difficulty in walking, not elsewhere classified (R26.2);Muscle weakness (generalized) (M62.81) Pain - Right/Left: Left Pain - part of body: Leg     Time: 6045-4098 PT Time Calculation (min) (ACUTE ONLY): 8 min  Charges:    $Gait Training: 8-22 mins PT General Charges $$ ACUTE PT VISIT: 1 Visit                    Danielle Dess, PTA 07/06/23, 12:17 PM

## 2023-07-06 NOTE — Transfer of Care (Signed)
Immediate Anesthesia Transfer of Care Note  Patient: EMMERSEN HEEKE  Procedure(s) Performed: WOUND VAC EXCHANGE LEFT GROIN (Left)  Patient Location: PACU  Anesthesia Type:General  Level of Consciousness: drowsy and patient cooperative  Airway & Oxygen Therapy: Patient Spontanous Breathing and Patient connected to nasal cannula oxygen  Post-op Assessment: Report given to RN and Post -op Vital signs reviewed and stable  Post vital signs: Reviewed and stable  Last Vitals:  Vitals Value Taken Time  BP 124/46 07/06/23 0851  Temp 36.5 C 07/06/23 0851  Pulse 62 07/06/23 0854  Resp 18 07/06/23 0854  SpO2 96 % 07/06/23 0854  Vitals shown include unfiled device data.  Last Pain:  Vitals:   07/06/23 0851  TempSrc:   PainSc: 0-No pain      Patients Stated Pain Goal: 0 (07/05/23 2221)  Complications: No notable events documented.

## 2023-07-06 NOTE — Progress Notes (Signed)
Occupational Therapy Treatment Patient Details Name: Laura Reed MRN: [id number] DOB: 1961/07/06 Today's Date: 07/06/2023   History of present illness Laura Reed  has presented today for surgery, with the diagnosis of Groin Infection. S/P I & D left groin. On April 29, 2023 she underwent a femoral to tibioperoneal trunk bypass using a PTFE distal flow graft.  She was noted to thrombose within the first 48 hours and subsequently underwent angiography with salvage of her bypass and stenting of the anterior tibial artery.  Following the second procedure which was May 01, 2023 she did well and was discharged to home approximately 10 days later. Wound vac change 12/23.   OT comments  Pt seen for OT tx focused on optimizing home setup and AE/DME needs to improve safety and independence with ADL. OT facilitated problem solving for ADL at home to maximize independence and safety. Educated in AE/DME, energy conservation strategies to minimize over exertion. Pt verablizes plan her sister is going to order some DME for the shower. OT provided education in grab bar positioning, shower chair features to consider, and handheld shower head features to consider. Pt verbalized understanding. Pt continues to benefit from skilled OT services. Progressing well towards goals.       If plan is discharge home, recommend the following:  A little help with walking and/or transfers;A little help with bathing/dressing/bathroom;Assistance with cooking/housework;Assist for transportation;Help with stairs or ramp for entrance   Equipment Recommendations  Tub/shower seat;Other (comment) (handheld shower head, grab bar for shower)    Recommendations for Other Services      Precautions / Restrictions Precautions Precautions: Fall Precaution Comments: wound vac groin/ post op shoe Restrictions Weight Bearing Restrictions Per Provider Order: No       Mobility Bed Mobility               General bed  mobility comments: In recliner pre/post session    Transfers                         Balance                                           ADL either performed or assessed with clinical judgement   ADL                                              Extremity/Trunk Assessment              Vision       Perception     Praxis      Cognition Arousal: Alert Behavior During Therapy: WFL for tasks assessed/performed Overall Cognitive Status: Within Functional Limits for tasks assessed                                          Exercises Other Exercises Other Exercises: OT facilitated problem solving for ADL at home to maximize independence and safety. Educated in AE/DME, energy conservation strategies to minimize over exertion. Pt verablizes plan her sister is going to order some DME for the shower. OT provided education in grab bar positioning, shower chair features to  consider, and handheld shower head features to consider.    Shoulder Instructions       General Comments      Pertinent Vitals/ Pain       Pain Assessment Pain Assessment: 0-10 Pain Score: 2  Pain Location: L groin Pain Descriptors / Indicators: Discomfort, Sore Pain Intervention(s): Monitored during session  Home Living                                          Prior Functioning/Environment              Frequency  Min 1X/week        Progress Toward Goals  OT Goals(current goals can now be found in the care plan section)  Progress towards OT goals: Progressing toward goals  Acute Rehab OT Goals Patient Stated Goal: get stronger and be independent OT Goal Formulation: With patient/family Time For Goal Achievement: 07/12/23 Potential to Achieve Goals: Good  Plan      Co-evaluation                 AM-PAC OT "6 Clicks" Daily Activity     Outcome Measure   Help from another person eating meals?:  None Help from another person taking care of personal grooming?: None Help from another person toileting, which includes using toliet, bedpan, or urinal?: A Little Help from another person bathing (including washing, rinsing, drying)?: A Little Help from another person to put on and taking off regular upper body clothing?: None Help from another person to put on and taking off regular lower body clothing?: A Little 6 Click Score: 21    End of Session    OT Visit Diagnosis: Other abnormalities of gait and mobility (R26.89)   Activity Tolerance Patient tolerated treatment well   Patient Left in chair;with call bell/phone within reach;with chair alarm set   Nurse Communication          Time: 1610-9604 OT Time Calculation (min): 29 min  Charges: OT General Charges $OT Visit: 1 Visit OT Treatments $Self Care/Home Management : 23-37 mins  Arman Filter., MPH, MS, OTR/L ascom [phone] 07/06/23, 5:07 PM

## 2023-07-07 ENCOUNTER — Encounter: Payer: Self-pay | Admitting: Vascular Surgery

## 2023-07-07 DIAGNOSIS — M79605 Pain in left leg: Secondary | ICD-10-CM | POA: Diagnosis not present

## 2023-07-07 LAB — CBC WITH DIFFERENTIAL/PLATELET
Abs Immature Granulocytes: 0.1 10*3/uL — ABNORMAL HIGH (ref 0.00–0.07)
Basophils Absolute: 0.1 10*3/uL (ref 0.0–0.1)
Basophils Relative: 1 %
Eosinophils Absolute: 0.1 10*3/uL (ref 0.0–0.5)
Eosinophils Relative: 1 %
HCT: 24.4 % — ABNORMAL LOW (ref 36.0–46.0)
Hemoglobin: 8.1 g/dL — ABNORMAL LOW (ref 12.0–15.0)
Immature Granulocytes: 1 %
Lymphocytes Relative: 13 %
Lymphs Abs: 1.3 10*3/uL (ref 0.7–4.0)
MCH: 28.9 pg (ref 26.0–34.0)
MCHC: 33.2 g/dL (ref 30.0–36.0)
MCV: 87.1 fL (ref 80.0–100.0)
Monocytes Absolute: 1 10*3/uL (ref 0.1–1.0)
Monocytes Relative: 10 %
Neutro Abs: 7.6 10*3/uL (ref 1.7–7.7)
Neutrophils Relative %: 74 %
Platelets: 360 10*3/uL (ref 150–400)
RBC: 2.8 MIL/uL — ABNORMAL LOW (ref 3.87–5.11)
RDW: 14.2 % (ref 11.5–15.5)
WBC: 10.2 10*3/uL (ref 4.0–10.5)
nRBC: 0 % (ref 0.0–0.2)

## 2023-07-07 LAB — GLUCOSE, CAPILLARY
Glucose-Capillary: 64 mg/dL — ABNORMAL LOW (ref 70–99)
Glucose-Capillary: 82 mg/dL (ref 70–99)
Glucose-Capillary: 223 mg/dL — ABNORMAL HIGH (ref 70–99)
Glucose-Capillary: 128 mg/dL — ABNORMAL HIGH (ref 70–99)
Glucose-Capillary: 120 mg/dL — ABNORMAL HIGH (ref 70–99)

## 2023-07-07 LAB — BASIC METABOLIC PANEL
Anion gap: 6 (ref 5–15)
BUN: 21 mg/dL (ref 8–23)
CO2: 28 mmol/L (ref 22–32)
Calcium: 8.1 mg/dL — ABNORMAL LOW (ref 8.9–10.3)
Chloride: 102 mmol/L (ref 98–111)
Creatinine, Ser: 0.72 mg/dL (ref 0.44–1.00)
GFR, Estimated: >60 mL/min (ref >60)
Glucose, Bld: 66 mg/dL — ABNORMAL LOW (ref 70–99)
Potassium: 4 mmol/L (ref 3.5–5.1)
Sodium: 136 mmol/L (ref 135–145)

## 2023-07-07 LAB — AEROBIC/ANAEROBIC CULTURE W GRAM STAIN (SURGICAL/DEEP WOUND): Culture: NO GROWTH

## 2023-07-07 LAB — RENAL FUNCTION PANEL

## 2023-07-07 NOTE — Care Management Important Message (Signed)
 Important Message  Patient Details  Name: Laura Reed MRN: [id number] Date of Birth: 06/20/1961   Important Message Given:  Yes - Medicare IM     Quentina Fronek W, CMA 07/07/2023, 11:09 AM

## 2023-07-07 NOTE — Inpatient Diabetes Management (Signed)
 Inpatient Diabetes Program Recommendations  AACE/ADA: New Consensus Statement on Inpatient Glycemic Control (2015)  Target Ranges:  Prepandial:   less than 140 mg/dL      Peak postprandial:   less than 180 mg/dL (1-2 hours)      Critically ill patients:  140 - 180 mg/dL    Latest Reference Range & Units 07/06/23 07:40 07/06/23 08:53 07/06/23 11:22 07/06/23 16:23 07/06/23 21:05  Glucose-Capillary 70 - 99 mg/dL 99 97 893 (H)  3 units Novolog   95  3 units Novolog   239 (H)  5 units Novolog   15 units Semglee    (H): Data is abnormally high  Latest Reference Range & Units 07/07/23 08:09 07/07/23 08:17  Glucose-Capillary 70 - 99 mg/dL 64 (L) 82  (L): Data is abnormally low      Home DM Meds: Tresiba  22 every day      Humalog  10 units TID      Jardiance  25 every day   Current Orders: Semglee  15 units at HS    Novolog  0-15 units TID     Novolog  3 units TID with meals     MD- Note Hypoglycemia this AM  Please consider:  1. Reduce Semglee  to 12 units at bedtime  2. Stop Novolog  SSI at bedtime (continue Novolog  SSI at meals)     --Will follow patient during hospitalization--  Adina Rudolpho Arrow RN, MSN, CDCES Diabetes Coordinator Inpatient Glycemic Control Team Team Pager: [phone] (8a-5p)

## 2023-07-07 NOTE — Plan of Care (Signed)
 Patient without distress. Medicated as per orders. Wound vac remains in place. Will continue to monitor.   Problem: Education: Goal: Ability to describe self-care measures that may prevent or decrease complications (Diabetes Survival Skills Education) will improve Outcome: Progressing Goal: Individualized Educational Video(s) Outcome: Progressing   Problem: Coping: Goal: Ability to adjust to condition or change in health will improve Outcome: Progressing   Problem: Fluid Volume: Goal: Ability to maintain a balanced intake and output will improve Outcome: Progressing   Problem: Health Behavior/Discharge Planning: Goal: Ability to identify and utilize available resources and services will improve Outcome: Progressing Goal: Ability to manage health-related needs will improve Outcome: Progressing   Problem: Metabolic: Goal: Ability to maintain appropriate glucose levels will improve Outcome: Progressing   Problem: Nutritional: Goal: Maintenance of adequate nutrition will improve Outcome: Progressing Goal: Progress toward achieving an optimal weight will improve Outcome: Progressing   Problem: Skin Integrity: Goal: Risk for impaired skin integrity will decrease Outcome: Progressing   Problem: Tissue Perfusion: Goal: Adequacy of tissue perfusion will improve Outcome: Progressing   Problem: Education: Goal: Knowledge of General Education information will improve Description: Including pain rating scale, medication(s)/side effects and non-pharmacologic comfort measures Outcome: Progressing   Problem: Health Behavior/Discharge Planning: Goal: Ability to manage health-related needs will improve Outcome: Progressing   Problem: Clinical Measurements: Goal: Ability to maintain clinical measurements within normal limits will improve Outcome: Progressing Goal: Will remain free from infection Outcome: Progressing Goal: Diagnostic test results will improve Outcome:  Progressing Goal: Respiratory complications will improve Outcome: Progressing Goal: Cardiovascular complication will be avoided Outcome: Progressing   Problem: Activity: Goal: Risk for activity intolerance will decrease Outcome: Progressing   Problem: Nutrition: Goal: Adequate nutrition will be maintained Outcome: Progressing   Problem: Coping: Goal: Level of anxiety will decrease Outcome: Progressing   Problem: Elimination: Goal: Will not experience complications related to bowel motility Outcome: Progressing Goal: Will not experience complications related to urinary retention Outcome: Progressing   Problem: Pain Management: Goal: General experience of comfort will improve Outcome: Progressing   Problem: Safety: Goal: Ability to remain free from injury will improve Outcome: Progressing   Problem: Skin Integrity: Goal: Risk for impaired skin integrity will decrease Outcome: Progressing

## 2023-07-07 NOTE — Progress Notes (Signed)
 PT Cancellation Note  Patient Details Name: Laura Reed MRN: [id number] DOB: Jul 16, 1960   Cancelled Treatment:    Reason Eval/Treat Not Completed: Patient declined, no reason specified. Pt currently ambulating with Mod I in hallway with RW. She is expected to have another procedure (wound vac change and flap assessment) on 07/09/23. Pt is agreeable to begin cane training following her procedure on the 2nd.    Kayelee Herbig A Rhianna Raulerson 07/07/2023, 12:58 PM

## 2023-07-07 NOTE — TOC Progression Note (Addendum)
 Transition of Care [facility]) - Progression Note    Patient Details  Name: Laura Reed MRN: [id number] Date of Birth: [birth year]  [facility]) CM/SW Contact  Royanne JINNY Bernheim, RN Phone Number: 07/07/2023, 9:22 AM  Clinical Narrative:    Reached out to [facility] to inquire if they are going to be able to accept the patient for [facility] RN and PT for wound vac and IV ABX  [facility] is not able to accept the patient, they do not have Nursing [facility] does not have Nursing that can support wound vac and IV ABX Sent the referral to [facility]  and [facility]  [facility] is book up and does not have staffing for RN   Will continue to follow and search for a company that can accept her      Expected Discharge Plan and Services                                               Social Determinants of Health (SDOH) Interventions SDOH Screenings   Food Insecurity: No Food Insecurity (06/23/2023)  Recent Concern: Food Insecurity - Food Insecurity Present (05/26/2023)   Received from [facility]  Housing: Low Risk  (06/23/2023)  Transportation Needs: No Transportation Needs (06/23/2023)  Utilities: Not At Risk (06/23/2023)  Alcohol Screen: Low Risk  (06/17/2023)  Depression (PHQ2-9): Low Risk  (06/17/2023)  Financial Resource Strain: Low Risk  (06/21/2023)   Received from [facility]  Physical Activity: Inactive (06/17/2023)  Social Connections: Socially Isolated (06/17/2023)  Stress: Stress Concern Present (06/17/2023)  Tobacco Use: Medium Risk (06/29/2023)  Health Literacy: Inadequate Health Literacy (06/17/2023)    Readmission Risk Interventions    06/22/2023   11:01 AM  Readmission Risk Prevention Plan  Transportation Screening Complete  PCP or Specialist Appt within 3-5 Days Complete  Social Work Consult for Recovery Care Planning/Counseling Complete  Palliative Care Screening Not Applicable  Medication Review Furniture Conservator/restorer) Complete

## 2023-07-07 NOTE — H&P (View-Only) (Signed)
 Progress Note    07/07/2023 11:29 AM 1 Day Post-Op  Subjective:  Laura Reed is a 62 year old female now status postop day 1 from wound VAC washout and change in the operating room for the 4th time. Wound VAC working well with noted 45 cc of serous drainage to the canister. No complaints overnight. Patient continues to work with physical therapy. Vitals are remained stable.    Vitals:   07/06/23 2355 07/07/23 0821  BP: (!) 126/43 (!) 121/46  Pulse: 65 64  Resp: 18 18  Temp: 97.7 F (36.5 C) 98.5 F (36.9 C)  SpO2: 95% 98%   Physical Exam: Cardiac:  RRR, Normal S1,S2. No murmurs appreciated.  Bradycardia resolved. Lungs: Clear on auscultation throughout but remains diminished in the bases.  Nonlabored breathing.  Nonproductive cough this morning.  Instructed to use bedside incentive spirometry. Incisions: Left groin with wound VAC in place.  Working well. Extremities: Bilateral lower extremities warm to touch.  Left lower extremity with +2 edema.  Able to palpate pulses but has positive Doppler pulses. Abdomen: Positive bowel sounds throughout, soft, nontender nondistended. Neurologic: Alert and oriented x 3, answers all questions and follows commands appropriately.  CBC    Component Value Date/Time   WBC 10.2 07/07/2023 0448   RBC 2.80 (L) 07/07/2023 0448   HGB 8.1 (L) 07/07/2023 0448   HGB 9.5 (L) 11/02/2014 0609   HCT 24.4 (L) 07/07/2023 0448   HCT 29.5 (L) 11/02/2014 0609   PLT 360 07/07/2023 0448   PLT 217 11/02/2014 0609   MCV 87.1 07/07/2023 0448   MCV 87 11/02/2014 0609   MCH 28.9 07/07/2023 0448   MCHC 33.2 07/07/2023 0448   RDW 14.2 07/07/2023 0448   RDW 13.6 11/02/2014 0609   LYMPHSABS 1.3 07/07/2023 0448   LYMPHSABS 1.2 11/02/2014 0609   MONOABS 1.0 07/07/2023 0448   MONOABS 1.1 (H) 11/02/2014 0609   EOSABS 0.1 07/07/2023 0448   EOSABS 0.3 11/02/2014 0609   BASOSABS 0.1 07/07/2023 0448   BASOSABS 0.0 11/02/2014 0609    BMET    Component Value  Date/Time   NA 136 07/07/2023 0448   NA 136 08/02/2021 1629   NA 135 11/02/2014 0609   K 4.0 07/07/2023 0448   K 4.2 11/02/2014 0609   CL 102 07/07/2023 0448   CL 107 11/02/2014 0609   CO2 28 07/07/2023 0448   CO2 23 11/02/2014 0609   GLUCOSE 66 (L) 07/07/2023 0448   GLUCOSE 108 (H) 11/02/2014 0609   BUN 21 07/07/2023 0448   BUN 25 08/02/2021 1629   BUN 20 11/02/2014 0609   CREATININE 0.72 07/07/2023 0448   CREATININE 1.01 11/09/2015 1549   CREATININE 1.01 11/09/2015 1549   CALCIUM  8.1 (L) 07/07/2023 0448   CALCIUM  7.3 (L) 11/02/2014 0609   GFRNONAA >60 07/07/2023 0448   GFRNONAA 50 (L) 11/02/2014 0609   GFRAA >60 01/19/2019 0357   GFRAA 58 (L) 11/02/2014 0609    INR    Component Value Date/Time   INR 1.1 06/21/2023 2304   INR 0.9 10/17/2014 1131     Intake/Output Summary (Last 24 hours) at 07/07/2023 1129 Last data filed at 07/07/2023 0815 Gross per 24 hour  Intake 120 ml  Output 250 ml  Net -130 ml     Assessment/Plan:  62 y.o. female is s/p wound VAC washout with wound VAC change.  1 Day Post-Op   Vascular surgery plans on taking the patient back to the operating room on 07/09/23 for another wound  VAC change out and assessment of the sartorius flap in her left groin.  Patient was reminded she would be made n.p.o. after midnight on 07/09/23.  She verbalized her understanding.  Wishes to proceed.    Dr. Selinda Gu MD is made aware of the plan and he agrees with plan.   DVT prophylaxis: Eliquis  2.5 twice daily, aspirin  81 mg daily, and Lipitor 20 daily    [name] [facility] 07/07/2023 11:29 AM

## 2023-07-07 NOTE — Progress Notes (Signed)
 Progress Note   Patient: Laura Reed FMW:[id number] DOB: [birth year] DOA: 06/21/2023     16 DOS: the patient was seen and examined on 07/07/2023    Brief hospital course:  HPI: 62 y.o female with significant PMH of HTN, HLD, DM, sCHF, CAD, PAD (s/p of left common femoral-distal bypass, thrombectomy, angioplasty and stent placement 04/2023 on aspirin , Plavix  and Eliquis ), anxiety, bladder cancer (s/p of nephrectomy). [facility] on 06/21/23 with complaints of right groin pain, redness and oozing post op. Patient has hx of PAD and recently underwent left common femoral-distal bypass, thrombectomy, angioplasty and stent placement 04/2023 and right transmetatarsal amputation. SBP in the 50s and was sent to the ED for further evaluation.    Hospital course / significant events:  12/16: admitted to hospitalist service later transferred to ICU for pressor support.  12/18: to OR, 200 cc pus drained from L groin. ID consulted.  12/19: remains stable off pressors 12/20: to OR, left groin debridement, muscle flap, wound VAC exchange  12/23: wound vac exchange. Wound appears appropriate and flap appears viable. On Eliquis , ASA. Still holding Plavix  and can probably resume once confirm no further plans for OR.  12/24-12/25: stable, planning repeat wound vac change 12/26 12/26: irrigation/debridement and vac change today.  12/27-12/29: await return to OR Sunday 12/29 for wound vac exchange. Diuresing for bilateral LE edema, Hx HFpEF     Consultants:  [facility] Vascular Surgery  Infectious disease    Procedures/Surgeries: 06/24/23: Excisional debridement of left groin abscess. Rotation of a sartorius muscle flap for coverage of the existing bypass grafts. Placement of a nondisposable wound VAC dressing left groin. Redo vascular surgery - Drs. [name] and Dew 06/26/23: Excisional debridement left groin wound. Placement of a nondisposable VAC dressing - Dr Jama 06/29/23: Left groin VAC dressing  change - Dr Jama   07/02/23: Irrigation and debridement of skin, soft tissue, and muscle for approximately 125 cm to the left groin wound, Pulse lavage irrigation left groin. Left groin VAC dressing change - Dr Marea       ASSESSMENT & PLAN:   Left groin abscess Sepsis with septic shock resolved Status post I&D with vascular surgery 12/18 with takeback to the OR for left groin debridement, muscle flap, wound VAC exchange 12/20, wound vac exchange 12/23 continue antimicrobials per ID Pain control Patient underwent wound VAC exchange on 07/06/2023 Vascular surgeon planning another wound VAC exchange on thursday   PAD s/p of left common femoral-tibioperoneal trunk bypass, thrombectomy, angioplasty and stent placement 04/2023  on aspirin , Plavix  and Eliquis   Continue eliquis  and aspirin  Continue statin therapy Aspirin  on hold until cleared by vascular surgery to resume   Leukocytosis  In the setting of her infection and multiple trips to the OR.   Monitor CBC Per ID consider transition to po abx once WBC improving    Normocytic anemia Expected ABLA d/t surgeries normal hemoglobin prior to vascular procedures.   some blood tinge output in her wound VAC S/p 1 unit of packed red blood cells by vascular team.  Can discharge on iron supplemntation  Monitor CBC closely   AKI - resolved Likely d/t septic shock resolved Monitor BMP esp w/ restart diuretics    Hypokalemia D/t Lasix   Replace as needed Monitor BMP   PVC Patient noted to have frequent PVCs on tele This is likely causing her heart rate to appear to be lower than normal.  Will ensure potassium >4, Magnesium >2 Continue to monitor   Hyponatremia  Resolved Monitor sodium  level   Chronic diastolic HF Edema bilateral LE Monitor fluid status  Diuresis prn - giving another dose IV lasix  today  Monitor BMP w/ diuresis   Hypoalbuminemia  Encourage po intake    HTN  HLD Continue statin Hold home  antihypertensives. Mostly normotensive off of midodrine  Continue to monitor blood pressure   T2DM  A1c 8 Continue long-acting 15 units Continue mealtime 3 units 3 times daily       DVT prophylaxis: ELiquis   IV fluids: no continuous IV fluids  Nutrition: carb modified  Central lines / invasive devices: wound vac    Code Status: FULL CODE   TOC needs: home health  Barriers to dispo / significant pending items: wound vac in place, iv abx, plannign wound vac change in OR      Subjective / Brief ROS:  Patient sitting up in bed admits to improvement in the thigh pain Denies nausea vomiting chest pain or cough Still has wound VAC in place with appropriate drainage   Family Communication: support person is at bedside on rounds       Physical Exam Constitutional:      General: She is not in acute distress. Cardiovascular:     Rate and Rhythm: Normal rate and regular rhythm.  Pulmonary:     Effort: Pulmonary effort is normal.     Breath sounds: Normal breath sounds.  Musculoskeletal: Wound VAC in place Neurological:     General: No focal deficit present.     Mental Status: She is alert and oriented to person, place, and time. Mental status is at baseline.  Psychiatric:        Mood and Affect: Mood normal.        Behavior: Behavior normal.     Data Reviewed:   Vitals:   07/06/23 2355 07/07/23 0333 07/07/23 0821 07/07/23 1541  BP: (!) 126/43  (!) 121/46 (!) 133/59  Pulse: 65  64 74  Resp: 18  18 14   Temp: 97.7 F (36.5 C)  98.5 F (36.9 C) 99 F (37.2 C)  TempSrc: Oral  Oral Oral  SpO2: 95%  98% 99%  Weight:  68 kg    Height:        Author: Drue ONEIDA Potter, MD 07/07/2023 5:54 PM  For on call review [email or web address].

## 2023-07-07 NOTE — Progress Notes (Signed)
 Progress Note    07/07/2023 11:29 AM 1 Day Post-Op  Subjective:  Laura Reed is a 62 year old female now status postop day 1 from wound VAC washout and change in the operating room for the 4th time. Wound VAC working well with noted 45 cc of serous drainage to the canister. No complaints overnight. Patient continues to work with physical therapy. Vitals are remained stable.    Vitals:   07/06/23 2355 07/07/23 0821  BP: (!) 126/43 (!) 121/46  Pulse: 65 64  Resp: 18 18  Temp: 97.7 F (36.5 C) 98.5 F (36.9 C)  SpO2: 95% 98%   Physical Exam: Cardiac:  RRR, Normal S1,S2. No murmurs appreciated.  Bradycardia resolved. Lungs: Clear on auscultation throughout but remains diminished in the bases.  Nonlabored breathing.  Nonproductive cough this morning.  Instructed to use bedside incentive spirometry. Incisions: Left groin with wound VAC in place.  Working well. Extremities: Bilateral lower extremities warm to touch.  Left lower extremity with +2 edema.  Able to palpate pulses but has positive Doppler pulses. Abdomen: Positive bowel sounds throughout, soft, nontender nondistended. Neurologic: Alert and oriented x 3, answers all questions and follows commands appropriately.  CBC    Component Value Date/Time   WBC 10.2 07/07/2023 0448   RBC 2.80 (L) 07/07/2023 0448   HGB 8.1 (L) 07/07/2023 0448   HGB 9.5 (L) 11/02/2014 0609   HCT 24.4 (L) 07/07/2023 0448   HCT 29.5 (L) 11/02/2014 0609   PLT 360 07/07/2023 0448   PLT 217 11/02/2014 0609   MCV 87.1 07/07/2023 0448   MCV 87 11/02/2014 0609   MCH 28.9 07/07/2023 0448   MCHC 33.2 07/07/2023 0448   RDW 14.2 07/07/2023 0448   RDW 13.6 11/02/2014 0609   LYMPHSABS 1.3 07/07/2023 0448   LYMPHSABS 1.2 11/02/2014 0609   MONOABS 1.0 07/07/2023 0448   MONOABS 1.1 (H) 11/02/2014 0609   EOSABS 0.1 07/07/2023 0448   EOSABS 0.3 11/02/2014 0609   BASOSABS 0.1 07/07/2023 0448   BASOSABS 0.0 11/02/2014 0609    BMET    Component Value  Date/Time   NA 136 07/07/2023 0448   NA 136 08/02/2021 1629   NA 135 11/02/2014 0609   K 4.0 07/07/2023 0448   K 4.2 11/02/2014 0609   CL 102 07/07/2023 0448   CL 107 11/02/2014 0609   CO2 28 07/07/2023 0448   CO2 23 11/02/2014 0609   GLUCOSE 66 (L) 07/07/2023 0448   GLUCOSE 108 (H) 11/02/2014 0609   BUN 21 07/07/2023 0448   BUN 25 08/02/2021 1629   BUN 20 11/02/2014 0609   CREATININE 0.72 07/07/2023 0448   CREATININE 1.01 11/09/2015 1549   CREATININE 1.01 11/09/2015 1549   CALCIUM  8.1 (L) 07/07/2023 0448   CALCIUM  7.3 (L) 11/02/2014 0609   GFRNONAA >60 07/07/2023 0448   GFRNONAA 50 (L) 11/02/2014 0609   GFRAA >60 01/19/2019 0357   GFRAA 58 (L) 11/02/2014 0609    INR    Component Value Date/Time   INR 1.1 06/21/2023 2304   INR 0.9 10/17/2014 1131     Intake/Output Summary (Last 24 hours) at 07/07/2023 1129 Last data filed at 07/07/2023 0815 Gross per 24 hour  Intake 120 ml  Output 250 ml  Net -130 ml     Assessment/Plan:  62 y.o. female is s/p wound VAC washout with wound VAC change.  1 Day Post-Op   Vascular surgery plans on taking the patient back to the operating room on 07/09/23 for another wound  VAC change out and assessment of the sartorius flap in her left groin.  Patient was reminded she would be made n.p.o. after midnight on 07/09/23.  She verbalized her understanding.  Wishes to proceed.    Dr. Selinda Gu MD is made aware of the plan and he agrees with plan.   DVT prophylaxis: Eliquis  2.5 twice daily, aspirin  81 mg daily, and Lipitor 20 daily    [facility] 07/07/2023 11:29 AM

## 2023-07-07 NOTE — Plan of Care (Signed)

## 2023-07-08 DIAGNOSIS — M79605 Pain in left leg: Secondary | ICD-10-CM | POA: Diagnosis not present

## 2023-07-08 LAB — CBC WITH DIFFERENTIAL/PLATELET
Abs Immature Granulocytes: 0.06 10*3/uL (ref 0.00–0.07)
Basophils Absolute: 0.1 10*3/uL (ref 0.0–0.1)
Basophils Relative: 1 %
Eosinophils Absolute: 0.2 10*3/uL (ref 0.0–0.5)
Eosinophils Relative: 2 %
HCT: 25.8 % — ABNORMAL LOW (ref 36.0–46.0)
Hemoglobin: 8.5 g/dL — ABNORMAL LOW (ref 12.0–15.0)
Immature Granulocytes: 1 %
Lymphocytes Relative: 15 %
Lymphs Abs: 1.3 10*3/uL (ref 0.7–4.0)
MCH: 28.9 pg (ref 26.0–34.0)
MCHC: 32.9 g/dL (ref 30.0–36.0)
MCV: 87.8 fL (ref 80.0–100.0)
Monocytes Absolute: 0.7 10*3/uL (ref 0.1–1.0)
Monocytes Relative: 8 %
Neutro Abs: 6.6 10*3/uL (ref 1.7–7.7)
Neutrophils Relative %: 73 %
Platelets: 354 10*3/uL (ref 150–400)
RBC: 2.94 MIL/uL — ABNORMAL LOW (ref 3.87–5.11)
RDW: 14.1 % (ref 11.5–15.5)
WBC: 8.9 10*3/uL (ref 4.0–10.5)
nRBC: 0 % (ref 0.0–0.2)

## 2023-07-08 LAB — GLUCOSE, CAPILLARY
Glucose-Capillary: 113 mg/dL — ABNORMAL HIGH (ref 70–99)
Glucose-Capillary: 95 mg/dL (ref 70–99)
Glucose-Capillary: 106 mg/dL — ABNORMAL HIGH (ref 70–99)
Glucose-Capillary: 98 mg/dL (ref 70–99)

## 2023-07-08 LAB — BASIC METABOLIC PANEL
Anion gap: 7 (ref 5–15)
BUN: 16 mg/dL (ref 8–23)
CO2: 29 mmol/L (ref 22–32)
Calcium: 8.3 mg/dL — ABNORMAL LOW (ref 8.9–10.3)
Chloride: 100 mmol/L (ref 98–111)
Creatinine, Ser: 0.68 mg/dL (ref 0.44–1.00)
GFR, Estimated: >60 mL/min (ref >60)
Glucose, Bld: 140 mg/dL — ABNORMAL HIGH (ref 70–99)
Potassium: 3.9 mmol/L (ref 3.5–5.1)
Sodium: 136 mmol/L (ref 135–145)

## 2023-07-08 MED ORDER — CHLORHEXIDINE GLUCONATE 4 % EX SOLN
60.0000 mL | Freq: Once | CUTANEOUS | Status: AC
  Administered 2023-07-09: 4 via TOPICAL

## 2023-07-08 MED ORDER — SODIUM CHLORIDE 0.9 % IV SOLN: INTRAVENOUS | Status: DC

## 2023-07-08 MED ORDER — CHLORHEXIDINE GLUCONATE 4 % EX SOLN: 60.0000 mL | Freq: Once | CUTANEOUS | Status: AC

## 2023-07-08 MED ORDER — CEFAZOLIN SODIUM-DEXTROSE 2-4 GM/100ML-% IV SOLN
2.0000 g | INTRAVENOUS | Status: AC
  Administered 2023-07-09: 2 g via INTRAVENOUS
  Filled 2023-07-08: qty 100

## 2023-07-08 NOTE — Progress Notes (Signed)
 Progress Note   Patient: Laura Reed FMW:[id number] DOB: 05-09-1961 DOA: 06/21/2023     17 DOS: the patient was seen and examined on 07/08/2023    Brief hospital course:  HPI: 63 y.o female with significant PMH of HTN, HLD, DM, sCHF, CAD, PAD (s/p of left common femoral-distal bypass, thrombectomy, angioplasty and stent placement 04/2023 on aspirin , Plavix  and Eliquis ), anxiety, bladder cancer (s/p of nephrectomy). [facility] on 06/21/23 with complaints of right groin pain, redness and oozing post op. Patient has hx of PAD and recently underwent left common femoral-distal bypass, thrombectomy, angioplasty and stent placement 04/2023 and right transmetatarsal amputation. SBP in the 50s and was sent to the ED for further evaluation.  Patient was initially admitted to hospitalist service later transferred to ICU for pressor support.  Patient has undergone multiple trips to the OR for wound debridement and wound VAC exchange.     Consultants:  [facility] Surgery  Infectious disease    Procedures/Surgeries: 06/24/23: Excisional debridement of left groin abscess. Rotation of a sartorius muscle flap for coverage of the existing bypass grafts. Placement of a nondisposable wound VAC dressing left groin. Redo vascular surgery - Drs. [name] and Dew 06/26/23: Excisional debridement left groin wound. Placement of a nondisposable VAC dressing - Dr Jama 06/29/23: Left groin VAC dressing change - Dr Jama   07/02/23: Irrigation and debridement of skin, soft tissue, and muscle for approximately 125 cm to the left groin wound, Pulse lavage irrigation left groin. Left groin VAC dressing change - Dr Marea       ASSESSMENT & PLAN:   Left groin abscess Sepsis with septic shock resolved Status post I&D with vascular surgery 12/18 with takeback to the OR for left groin debridement, muscle flap, wound VAC exchange 12/20, wound vac exchange 12/23 continue antimicrobials per ID Pain  control Patient underwent wound VAC exchange on 07/06/2023 Vascular surgeon planning another wound VAC exchange on thursday   PAD s/p of left common femoral-tibioperoneal trunk bypass, thrombectomy, angioplasty and stent placement 04/2023  on aspirin , Plavix  and Eliquis   Continue eliquis  and aspirin  Continue statin therapy Aspirin  on hold until cleared by vascular surgery to resume   Leukocytosis  In the setting of her infection and multiple trips to the OR.   Monitor CBC Per ID consider transition to po abx once WBC improving    Normocytic anemia Expected ABLA d/t surgeries normal hemoglobin prior to vascular procedures.   some blood tinge output in her wound VAC S/p 1 unit of packed red blood cells by vascular team.  Can discharge on iron supplemntation  Monitor CBC closely   AKI - resolved Likely d/t septic shock resolved Continue to monitor renal function   Hypokalemia D/t Lasix   Replace as needed Monitor BMP   PVC Patient noted to have frequent PVCs on tele This is likely causing her heart rate to appear to be lower than normal.  Will ensure potassium >4, Magnesium >2 Continue to monitor   Hyponatremia  Resolved Monitor sodium level   Chronic diastolic HF Edema bilateral LE Monitor fluid status  Diuresis prn  Monitor BMP w/ diuresis   Hypoalbuminemia  Encourage po intake    HTN  HLD Continue statin Hold home antihypertensives. Mostly normotensive off of midodrine  Continue to monitor blood pressure   T2DM  A1c 8 Continue long-acting 15 units Continue mealtime 3 units 3 times daily       DVT prophylaxis: ELiquis    Central lines / invasive devices: wound vac  Code Status: FULL CODE   TOC needs: home health  Barriers to dispo / significant pending items: wound vac in place, iv abx, plannign wound vac change in OR      Subjective / Brief ROS:  Patient seen sitting up in a chair with no complaints Being followed up by vascular surgeon Also  being planned for wound VAC exchange tomorrow According to vascular surgery patient will be here for about 2 more weeks  Family Communication: support person is at bedside on rounds       Physical Exam Constitutional:      General: She is not in acute distress. Cardiovascular:     Rate and Rhythm: Normal rate and regular rhythm.  Pulmonary:     Effort: Pulmonary effort is normal.     Breath sounds: Normal breath sounds.  Musculoskeletal: Wound VAC in place Neurological:     General: No focal deficit present.     Mental Status: She is alert and oriented to person, place, and time. Mental status is at baseline.  Psychiatric:        Mood and Affect: Mood normal.        Behavior: Behavior normal.     Data Reviewed:      Latest Ref Rng & Units 07/08/2023    4:46 AM 07/07/2023    4:48 AM 07/03/2023    5:11 AM  CBC  WBC 4.0 - 10.5 K/uL 8.9  10.2  12.4   Hemoglobin 12.0 - 15.0 g/dL 8.5  8.1  9.0   Hematocrit 36.0 - 46.0 % 25.8  24.4  27.7   Platelets 150 - 400 K/uL 354  360  434        Latest Ref Rng & Units 07/08/2023    4:46 AM 07/07/2023    4:48 AM 07/06/2023    4:58 AM  BMP  Glucose 70 - 99 mg/dL 859  66  886   BUN 8 - 23 mg/dL 16  21  23    Creatinine 0.44 - 1.00 mg/dL 9.31  9.27  9.25   Sodium 135 - 145 mmol/L 136  136  138   Potassium 3.5 - 5.1 mmol/L 3.9  4.0  4.2   Chloride 98 - 111 mmol/L 100  102  103   CO2 22 - 32 mmol/L 29  28  28    Calcium  8.9 - 10.3 mg/dL 8.3  8.1  7.5        Vitals:   07/07/23 2313 07/08/23 0434 07/08/23 0730 07/08/23 1625  BP: (!) 124/40  (!) 146/59 (!) 149/59  Pulse: 69  63 69  Resp: 17  16 16   Temp: 99 F (37.2 C)  98.3 F (36.8 C) 98.3 F (36.8 C)  TempSrc:   Oral Oral  SpO2: 93%  98% 100%  Weight:  69.4 kg    Height:         Author: Drue ONEIDA Potter, MD 07/08/2023 5:25 PM  For on call review [email or web address].

## 2023-07-08 NOTE — H&P (View-Only) (Signed)
 Subjective  -  Eating breakfast.  No complaints.   Physical Exam:  Groin wound VAC with good seal       Assessment/Plan:    Discussed going back to the operating room tomorrow for wound VAC change and further evaluation of her groin wound and muscle flap.  She will be n.p.o. after midnight.  Wells Erasmus Bistline 07/08/2023 1:49 PM --  Vitals:   07/07/23 2313 07/08/23 0730  BP: (!) 124/40 (!) 146/59  Pulse: 69 63  Resp: 17 16  Temp: 99 F (37.2 C) 98.3 F (36.8 C)  SpO2: 93% 98%    Intake/Output Summary (Last 24 hours) at 07/08/2023 1349 Last data filed at 07/08/2023 0800 Gross per 24 hour  Intake 1275.2 ml  Output 150 ml  Net 1125.2 ml     Laboratory CBC    Component Value Date/Time   WBC 8.9 07/08/2023 0446   HGB 8.5 (L) 07/08/2023 0446   HGB 9.5 (L) 11/02/2014 0609   HCT 25.8 (L) 07/08/2023 0446   HCT 29.5 (L) 11/02/2014 0609   PLT 354 07/08/2023 0446   PLT 217 11/02/2014 0609    BMET    Component Value Date/Time   NA 136 07/08/2023 0446   NA 136 08/02/2021 1629   NA 135 11/02/2014 0609   K 3.9 07/08/2023 0446   K 4.2 11/02/2014 0609   CL 100 07/08/2023 0446   CL 107 11/02/2014 0609   CO2 29 07/08/2023 0446   CO2 23 11/02/2014 0609   GLUCOSE 140 (H) 07/08/2023 0446   GLUCOSE 108 (H) 11/02/2014 0609   BUN 16 07/08/2023 0446   BUN 25 08/02/2021 1629   BUN 20 11/02/2014 0609   CREATININE 0.68 07/08/2023 0446   CREATININE 1.01 11/09/2015 1549   CREATININE 1.01 11/09/2015 1549   CALCIUM  8.3 (L) 07/08/2023 0446   CALCIUM  7.3 (L) 11/02/2014 0609   GFRNONAA >60 07/08/2023 0446   GFRNONAA 50 (L) 11/02/2014 0609   GFRAA >60 01/19/2019 0357   GFRAA 58 (L) 11/02/2014 0609    COAG Lab Results  Component Value Date   INR 1.1 06/21/2023   INR 0.9 10/17/2014   INR 1.1 01/19/2014   No results found for: PTT  Antibiotics Anti-infectives (From admission, onward)    Start     Dose/Rate Route Frequency Ordered Stop   07/06/23 0831  gentamicin   (GARAMYCIN ) injection  Status:  Discontinued          As needed 07/06/23 0831 07/06/23 0848   06/29/23 0620  ceFAZolin  (ANCEF ) IVPB 2g/100 mL premix        2 g 200 mL/hr over 30 Minutes Intravenous 30 min pre-op 06/29/23 0620 06/29/23 0747   06/26/23 1800  Ampicillin -Sulbactam (UNASYN ) 3 g in sodium chloride  0.9 % 100 mL IVPB        3 g 200 mL/hr over 30 Minutes Intravenous Every 6 hours 06/26/23 1646     06/24/23 1322  gentamicin  (GARAMYCIN ) injection  Status:  Discontinued          As needed 06/24/23 1322 06/24/23 1347   06/24/23 1303  tobramycin  (NEBCIN ) powder  Status:  Discontinued          As needed 06/24/23 1303 06/24/23 1347   06/24/23 1303  vancomycin  (VANCOCIN ) powder  Status:  Discontinued          As needed 06/24/23 1304 06/24/23 1347   06/24/23 1200  vancomycin  (VANCOREADY) IVPB 1250 mg/250 mL  Status:  Discontinued  Placed in Followed by Linked Group   1,250 mg 166.7 mL/hr over 90 Minutes Intravenous Every 24 hours 06/23/23 1244 06/23/23 1651   06/24/23 1000  DAPTOmycin  (CUBICIN ) IVPB 500 mg/50mL premix  Status:  Discontinued        8 mg/kg  64.1 kg 100 mL/hr over 30 Minutes Intravenous Daily 06/23/23 1651 06/26/23 1646   06/24/23 0234  ceFAZolin  (ANCEF ) IVPB 2g/100 mL premix        2 g 200 mL/hr over 30 Minutes Intravenous 30 min pre-op 06/24/23 0234 06/24/23 1300   06/23/23 1800  ceFEPIme  (MAXIPIME ) 2 g in sodium chloride  0.9 % 100 mL IVPB  Status:  Discontinued        2 g 200 mL/hr over 30 Minutes Intravenous Every 8 hours 06/23/23 1242 06/26/23 1646   06/23/23 1330  vancomycin  (VANCOREADY) IVPB 1250 mg/250 mL  Status:  Discontinued       Placed in Followed by Linked Group   1,250 mg 166.7 mL/hr over 90 Minutes Intravenous Every 24 hours 06/23/23 1242 06/23/23 1244   06/23/23 1201  vancomycin  (VANCOCIN ) IVPB 1000 mg/200 mL premix  Status:  Discontinued       Placed in Followed by Linked Group   1,000 mg 200 mL/hr over 60 Minutes Intravenous Every 24  hours 06/22/23 0042 06/23/23 1242   06/22/23 2200  ceFEPIme  (MAXIPIME ) 2 g in sodium chloride  0.9 % 100 mL IVPB  Status:  Discontinued        2 g 200 mL/hr over 30 Minutes Intravenous Every 12 hours 06/22/23 2120 06/23/23 1242   06/22/23 0200  piperacillin -tazobactam (ZOSYN ) IVPB 3.375 g  Status:  Discontinued        3.375 g 12.5 mL/hr over 240 Minutes Intravenous Every 8 hours 06/22/23 0042 06/22/23 2117   06/22/23 0045  vancomycin  (VANCOCIN ) IVPB 1000 mg/200 mL premix       Placed in Followed by Linked Group   1,000 mg 200 mL/hr over 60 Minutes Intravenous STAT 06/22/23 0042 06/22/23 0254        V. Malvina Serene CLORE, M.D., [facility] [place] Office: [phone] Pager:  [phone]

## 2023-07-08 NOTE — Plan of Care (Signed)
  Problem: Education: Goal: Ability to describe self-care measures that may prevent or decrease complications (Diabetes Survival Skills Education) will improve Outcome: Progressing Goal: Individualized Educational Video(s) Outcome: Progressing   Problem: Coping: Goal: Ability to adjust to condition or change in health will improve Outcome: Progressing   Problem: Fluid Volume: Goal: Ability to maintain a balanced intake and output will improve Outcome: Progressing   Problem: Metabolic: Goal: Ability to maintain appropriate glucose levels will improve Outcome: Progressing   Problem: Skin Integrity: Goal: Risk for impaired skin integrity will decrease Outcome: Progressing   Problem: Tissue Perfusion: Goal: Adequacy of tissue perfusion will improve Outcome: Progressing   Problem: Nutritional: Goal: Maintenance of adequate nutrition will improve Outcome: Progressing Goal: Progress toward achieving an optimal weight will improve Outcome: Progressing

## 2023-07-08 NOTE — Progress Notes (Signed)
 Subjective  -  Eating breakfast.  No complaints.   Physical Exam:  Groin wound VAC with good seal       Assessment/Plan:    Discussed going back to the operating room tomorrow for wound VAC change and further evaluation of her groin wound and muscle flap.  She will be n.p.o. after midnight.  Wells Erasmus Bistline 07/08/2023 1:49 PM --  Vitals:   07/07/23 2313 07/08/23 0730  BP: (!) 124/40 (!) 146/59  Pulse: 69 63  Resp: 17 16  Temp: 99 F (37.2 C) 98.3 F (36.8 C)  SpO2: 93% 98%    Intake/Output Summary (Last 24 hours) at 07/08/2023 1349 Last data filed at 07/08/2023 0800 Gross per 24 hour  Intake 1275.2 ml  Output 150 ml  Net 1125.2 ml     Laboratory CBC    Component Value Date/Time   WBC 8.9 07/08/2023 0446   HGB 8.5 (L) 07/08/2023 0446   HGB 9.5 (L) 11/02/2014 0609   HCT 25.8 (L) 07/08/2023 0446   HCT 29.5 (L) 11/02/2014 0609   PLT 354 07/08/2023 0446   PLT 217 11/02/2014 0609    BMET    Component Value Date/Time   NA 136 07/08/2023 0446   NA 136 08/02/2021 1629   NA 135 11/02/2014 0609   K 3.9 07/08/2023 0446   K 4.2 11/02/2014 0609   CL 100 07/08/2023 0446   CL 107 11/02/2014 0609   CO2 29 07/08/2023 0446   CO2 23 11/02/2014 0609   GLUCOSE 140 (H) 07/08/2023 0446   GLUCOSE 108 (H) 11/02/2014 0609   BUN 16 07/08/2023 0446   BUN 25 08/02/2021 1629   BUN 20 11/02/2014 0609   CREATININE 0.68 07/08/2023 0446   CREATININE 1.01 11/09/2015 1549   CREATININE 1.01 11/09/2015 1549   CALCIUM  8.3 (L) 07/08/2023 0446   CALCIUM  7.3 (L) 11/02/2014 0609   GFRNONAA >60 07/08/2023 0446   GFRNONAA 50 (L) 11/02/2014 0609   GFRAA >60 01/19/2019 0357   GFRAA 58 (L) 11/02/2014 0609    COAG Lab Results  Component Value Date   INR 1.1 06/21/2023   INR 0.9 10/17/2014   INR 1.1 01/19/2014   No results found for: PTT  Antibiotics Anti-infectives (From admission, onward)    Start     Dose/Rate Route Frequency Ordered Stop   07/06/23 0831  gentamicin   (GARAMYCIN ) injection  Status:  Discontinued          As needed 07/06/23 0831 07/06/23 0848   06/29/23 0620  ceFAZolin  (ANCEF ) IVPB 2g/100 mL premix        2 g 200 mL/hr over 30 Minutes Intravenous 30 min pre-op 06/29/23 0620 06/29/23 0747   06/26/23 1800  Ampicillin -Sulbactam (UNASYN ) 3 g in sodium chloride  0.9 % 100 mL IVPB        3 g 200 mL/hr over 30 Minutes Intravenous Every 6 hours 06/26/23 1646     06/24/23 1322  gentamicin  (GARAMYCIN ) injection  Status:  Discontinued          As needed 06/24/23 1322 06/24/23 1347   06/24/23 1303  tobramycin  (NEBCIN ) powder  Status:  Discontinued          As needed 06/24/23 1303 06/24/23 1347   06/24/23 1303  vancomycin  (VANCOCIN ) powder  Status:  Discontinued          As needed 06/24/23 1304 06/24/23 1347   06/24/23 1200  vancomycin  (VANCOREADY) IVPB 1250 mg/250 mL  Status:  Discontinued  Placed in Followed by Linked Group   1,250 mg 166.7 mL/hr over 90 Minutes Intravenous Every 24 hours 06/23/23 1244 06/23/23 1651   06/24/23 1000  DAPTOmycin  (CUBICIN ) IVPB 500 mg/50mL premix  Status:  Discontinued        8 mg/kg  64.1 kg 100 mL/hr over 30 Minutes Intravenous Daily 06/23/23 1651 06/26/23 1646   06/24/23 0234  ceFAZolin  (ANCEF ) IVPB 2g/100 mL premix        2 g 200 mL/hr over 30 Minutes Intravenous 30 min pre-op 06/24/23 0234 06/24/23 1300   06/23/23 1800  ceFEPIme  (MAXIPIME ) 2 g in sodium chloride  0.9 % 100 mL IVPB  Status:  Discontinued        2 g 200 mL/hr over 30 Minutes Intravenous Every 8 hours 06/23/23 1242 06/26/23 1646   06/23/23 1330  vancomycin  (VANCOREADY) IVPB 1250 mg/250 mL  Status:  Discontinued       Placed in Followed by Linked Group   1,250 mg 166.7 mL/hr over 90 Minutes Intravenous Every 24 hours 06/23/23 1242 06/23/23 1244   06/23/23 1201  vancomycin  (VANCOCIN ) IVPB 1000 mg/200 mL premix  Status:  Discontinued       Placed in Followed by Linked Group   1,000 mg 200 mL/hr over 60 Minutes Intravenous Every 24  hours 06/22/23 0042 06/23/23 1242   06/22/23 2200  ceFEPIme  (MAXIPIME ) 2 g in sodium chloride  0.9 % 100 mL IVPB  Status:  Discontinued        2 g 200 mL/hr over 30 Minutes Intravenous Every 12 hours 06/22/23 2120 06/23/23 1242   06/22/23 0200  piperacillin -tazobactam (ZOSYN ) IVPB 3.375 g  Status:  Discontinued        3.375 g 12.5 mL/hr over 240 Minutes Intravenous Every 8 hours 06/22/23 0042 06/22/23 2117   06/22/23 0045  vancomycin  (VANCOCIN ) IVPB 1000 mg/200 mL premix       Placed in Followed by Linked Group   1,000 mg 200 mL/hr over 60 Minutes Intravenous STAT 06/22/23 0042 06/22/23 0254        V. Malvina Serene CLORE, M.D., [facility] [place] Office: [phone] Pager:  [phone]

## 2023-07-09 ENCOUNTER — Inpatient Hospital Stay: Payer: 59 | Admitting: Anesthesiology

## 2023-07-09 ENCOUNTER — Encounter
Admission: EM | Disposition: A | Discharge status: Home Health | Payer: Self-pay | Source: Home / Self Care | Attending: Internal Medicine

## 2023-07-09 ENCOUNTER — Telehealth: Payer: Self-pay

## 2023-07-09 ENCOUNTER — Encounter: Payer: Self-pay | Admitting: Internal Medicine

## 2023-07-09 DIAGNOSIS — M79605 Pain in left leg: Secondary | ICD-10-CM | POA: Diagnosis not present

## 2023-07-09 DIAGNOSIS — Z95828 Presence of other vascular implants and grafts: Secondary | ICD-10-CM | POA: Diagnosis not present

## 2023-07-09 DIAGNOSIS — Z9889 Other specified postprocedural states: Secondary | ICD-10-CM | POA: Diagnosis not present

## 2023-07-09 DIAGNOSIS — T827XXA Infection and inflammatory reaction due to other cardiac and vascular devices, implants and grafts, initial encounter: Secondary | ICD-10-CM | POA: Diagnosis not present

## 2023-07-09 HISTORY — PX: APPLICATION OF WOUND VAC: SHX5189

## 2023-07-09 LAB — CBC WITH DIFFERENTIAL/PLATELET
Abs Immature Granulocytes: 0.06 10*3/uL (ref 0.00–0.07)
Basophils Absolute: 0.1 10*3/uL (ref 0.0–0.1)
Basophils Relative: 1 %
Eosinophils Absolute: 0.2 10*3/uL (ref 0.0–0.5)
Eosinophils Relative: 2 %
HCT: 22.6 % — ABNORMAL LOW (ref 36.0–46.0)
Hemoglobin: 7.7 g/dL — ABNORMAL LOW (ref 12.0–15.0)
Immature Granulocytes: 1 %
Lymphocytes Relative: 10 %
Lymphs Abs: 0.9 10*3/uL (ref 0.7–4.0)
MCH: 29.2 pg (ref 26.0–34.0)
MCHC: 34.1 g/dL (ref 30.0–36.0)
MCV: 85.6 fL (ref 80.0–100.0)
Monocytes Absolute: 0.7 10*3/uL (ref 0.1–1.0)
Monocytes Relative: 8 %
Neutro Abs: 7.2 10*3/uL (ref 1.7–7.7)
Neutrophils Relative %: 78 %
Platelets: 323 10*3/uL (ref 150–400)
RBC: 2.64 MIL/uL — ABNORMAL LOW (ref 3.87–5.11)
RDW: 14 % (ref 11.5–15.5)
WBC: 9 10*3/uL (ref 4.0–10.5)
nRBC: 0 % (ref 0.0–0.2)

## 2023-07-09 LAB — GLUCOSE, CAPILLARY
Glucose-Capillary: 110 mg/dL — ABNORMAL HIGH (ref 70–99)
Glucose-Capillary: 112 mg/dL — ABNORMAL HIGH (ref 70–99)
Glucose-Capillary: 143 mg/dL — ABNORMAL HIGH (ref 70–99)
Glucose-Capillary: 127 mg/dL — ABNORMAL HIGH (ref 70–99)

## 2023-07-09 LAB — BASIC METABOLIC PANEL
Anion gap: 10 (ref 5–15)
BUN: 17 mg/dL (ref 8–23)
CO2: 24 mmol/L (ref 22–32)
Calcium: 7.9 mg/dL — ABNORMAL LOW (ref 8.9–10.3)
Chloride: 102 mmol/L (ref 98–111)
Creatinine, Ser: 0.59 mg/dL (ref 0.44–1.00)
GFR, Estimated: >60 mL/min (ref >60)
Glucose, Bld: 132 mg/dL — ABNORMAL HIGH (ref 70–99)
Potassium: 3.5 mmol/L (ref 3.5–5.1)
Sodium: 136 mmol/L (ref 135–145)

## 2023-07-09 SURGERY — APPLICATION, WOUND VAC
Anesthesia: Choice | Laterality: Left

## 2023-07-09 MED ORDER — FENTANYL CITRATE (PF) 100 MCG/2ML IJ SOLN
INTRAMUSCULAR | Status: AC
  Filled 2023-07-09: qty 2

## 2023-07-09 MED ORDER — OXYCODONE HCL 5 MG/5ML PO SOLN: 5.0000 mg | Freq: Once | ORAL | Status: DC | PRN

## 2023-07-09 MED ORDER — PROPOFOL 500 MG/50ML IV EMUL
INTRAVENOUS | Status: DC | PRN
  Administered 2023-07-09: 25 ug/kg/min via INTRAVENOUS

## 2023-07-09 MED ORDER — PROPOFOL 10 MG/ML IV BOLUS
INTRAVENOUS | Status: DC | PRN
  Administered 2023-07-09: 30 mg via INTRAVENOUS

## 2023-07-09 MED ORDER — FENTANYL CITRATE (PF) 100 MCG/2ML IJ SOLN
INTRAMUSCULAR | Status: DC | PRN
  Administered 2023-07-09: 50 ug via INTRAVENOUS

## 2023-07-09 MED ORDER — MIDAZOLAM HCL 2 MG/2ML IJ SOLN
INTRAMUSCULAR | Status: DC | PRN
  Administered 2023-07-09: 2 mg via INTRAVENOUS

## 2023-07-09 MED ORDER — PROPOFOL 10 MG/ML IV BOLUS
INTRAVENOUS | Status: AC
  Filled 2023-07-09: qty 40

## 2023-07-09 MED ORDER — MIDAZOLAM HCL 2 MG/2ML IJ SOLN
INTRAMUSCULAR | Status: AC
  Filled 2023-07-09: qty 2

## 2023-07-09 MED ORDER — VANCOMYCIN HCL 1000 MG IV SOLR
INTRAVENOUS | Status: AC
  Filled 2023-07-09: qty 20

## 2023-07-09 MED ORDER — GENTAMICIN SULFATE 40 MG/ML IJ SOLN
INTRAMUSCULAR | Status: AC
  Filled 2023-07-09: qty 2

## 2023-07-09 MED ORDER — FENTANYL CITRATE (PF) 100 MCG/2ML IJ SOLN
25.0000 ug | INTRAMUSCULAR | Status: DC | PRN
Start: 2023-07-09 — End: 2023-07-09

## 2023-07-09 MED ORDER — OXYCODONE HCL 5 MG PO TABS: 5.0000 mg | ORAL_TABLET | Freq: Once | ORAL | Status: DC | PRN

## 2023-07-09 SURGICAL SUPPLY — 23 items
CANISTER WOUND CARE 500ML ATS (WOUND CARE) ×1 IMPLANT
DRAPE INCISE IOBAN 66X45 STRL (DRAPES) ×1 IMPLANT
DRAPE LAPAROTOMY 100X77 ABD (DRAPES) ×1 IMPLANT
DRSG EMULSION OIL 3X8 NADH (GAUZE/BANDAGES/DRESSINGS) IMPLANT
DRSG VAC GRANUFOAM LG (GAUZE/BANDAGES/DRESSINGS) ×1 IMPLANT
DRSG VAC GRANUFOAM MED (GAUZE/BANDAGES/DRESSINGS) ×1 IMPLANT
ELECT REM PT RETURN 9FT ADLT (ELECTROSURGICAL) ×1 IMPLANT
ELECTRODE REM PT RTRN 9FT ADLT (ELECTROSURGICAL) ×1 IMPLANT
GAUZE 4X4 16PLY ~~LOC~~+RFID DBL (SPONGE) ×1 IMPLANT
GLOVE BIO SURGEON STRL SZ7 (GLOVE) ×2 IMPLANT
GOWN STRL REUS W/ TWL LRG LVL3 (GOWN DISPOSABLE) ×2 IMPLANT
GOWN STRL REUS W/TWL 2XL LVL3 (GOWN DISPOSABLE) ×1 IMPLANT
GRAFT SKIN WND MESHED 7X10 (Tissue) IMPLANT
GRAFT SKIN WND SURGICLOSE M95 (Tissue) IMPLANT
KIT TURNOVER KIT A (KITS) ×1 IMPLANT
MANIFOLD NEPTUNE II (INSTRUMENTS) ×1 IMPLANT
NS IRRIG 1000ML POUR BTL (IV SOLUTION) ×1 IMPLANT
PACK BASIN MINOR ARMC (MISCELLANEOUS) ×1 IMPLANT
SOL PREP PVP 2OZ (MISCELLANEOUS) ×1 IMPLANT
SOLUTION PREP PVP 2OZ (MISCELLANEOUS) ×1 IMPLANT
SPONGE T-LAP 18X18 ~~LOC~~+RFID (SPONGE) ×1 IMPLANT
TRAP FLUID SMOKE EVACUATOR (MISCELLANEOUS) ×1 IMPLANT
WATER STERILE IRR 500ML POUR (IV SOLUTION) ×1 IMPLANT

## 2023-07-09 NOTE — Op Note (Signed)
    OPERATIVE NOTE   PROCEDURE: Irrigation and debridement of skin, soft tissue, and muscle for approximately 75 cm to the left groin Application of Kerecis biologic to the wound to the left groin Placement of a nondisposable negative pressure dressing  PRE-OPERATIVE DIAGNOSIS: Nonviable tissue and infection of left groin status post sartorius flap placement for exposed femoral to femoral and femoral to popliteal bypass  POST-OPERATIVE DIAGNOSIS: Same as above  SURGEON: Selinda Gu, MD  ASSISTANT(S): Gwendlyn Shank, NP  ANESTHESIA: general  ESTIMATED BLOOD LOSS: 10 cc  FINDING(S): None  SPECIMEN(S):  none  INDICATIONS:   DEION FORGUE is a 63 y.o. female who presents with a chronic left groin wound status post sartorius flap placement for exposed femoral to femoral and femoral to popliteal bypass grafts.  She is brought back to the operating room for wound debridement and VAC change.  At the last setting, a Kerecis biologic was placed in the left groin wound as were antibiotic impregnated stimulan beads.  She is brought back for repeat wound exploration, debridement, and VAC dressing change.  An assistant is used to help with the procedure for lighting, visualization, and dressing placement.  Risks and benefits were discussed with the patient and she is agreeable to proceed  DESCRIPTION: After obtaining full informed written consent, the patient was brought back to the operating room and placed supine upon the operating table.  The patient received IV antibiotics prior to induction.  After obtaining adequate anesthesia, the patient was prepped and draped in the standard fashion. The assistant provided retraction and mobilization to help facilitate exposure and expedite the procedure throughout the entire procedure.  This included following suture, using retractors, and optimizing lighting.  The wound had significantly improved over the past week.  There were some fibrinous exudate  particularly in the medial portion of the wound.  The sartorius muscle appeared grossly viable and was beginning to get some good granulation tissue although some fibrinous exudate was present.  The wound was then opened and excisional debridement was performed to the skin, soft tissue, and muscle to remove all clearly non-viable tissue.  The tissue was taken back to bleeding tissue that appeared viable.  The debridement was performed with a rough sponge and scissors and encompassed an area of approximately 75 cm2.  The wound had decreased in size and now measured at its longest length 15 cm, at its widest width 6 cm, and was now about 3 cm in depth.  The wound was irrigated copiously with sterile saline.  After all clearly non-viable tissue was removed, we placed another round of micronized Kerecis into the wound as a biologic to try to get coverage over the exposed portion of the prosthetic bypass.  This was decreasing in size but still visible.  A piece of Adaptic was then placed over the Kerecis and the bypass and a VAC sponge was cut to fit the wound.  Strips of Ioban were used and a good occlusive seal was obtained when connected to suction tubing. The patient was then awakened from anesthesia and taken to the recovery room in stable condition having tolerated the procedure well.  COMPLICATIONS: none  CONDITION: stable  Selinda Gu  07/09/2023, 8:11 AM   This note was created with Dragon Medical transcription system. Any errors in dictation are purely unintentional.

## 2023-07-09 NOTE — Transfer of Care (Signed)
 Immediate Anesthesia Transfer of Care Note  Patient: Laura Reed  Procedure(s) Performed: WOUND VAC EXCHANGE OF LEFT GROIN (Left)  Patient Location: PACU  Anesthesia Type:General  Level of Consciousness: oriented, drowsy, and patient cooperative  Airway & Oxygen Therapy: Patient Spontanous Breathing and Patient connected to nasal cannula oxygen  Post-op Assessment: Report given to RN and Post -op Vital signs reviewed and stable  Post vital signs: Reviewed and stable  Last Vitals:  Vitals Value Taken Time  BP 120/59 07/09/23 0820  Temp 36.6 C 07/09/23 0820  Pulse 57 07/09/23 0827  Resp 10 07/09/23 0827  SpO2 99 % 07/09/23 0827  Vitals shown include unfiled device data.  Last Pain:  Vitals:   07/09/23 0820  TempSrc:   PainSc: 0-No pain      Patients Stated Pain Goal: 0 (07/08/23 2052)  Complications: No notable events documented.

## 2023-07-09 NOTE — Progress Notes (Signed)
 Occupational Therapy Treatment Patient Details Name: Laura Reed MRN: [id number] DOB: 12/20/1960 Today's Date: 07/09/2023   History of present illness NAARAH BORGERDING  has presented today for surgery, with the diagnosis of Groin Infection. S/P I & D left groin. On April 29, 2023 she underwent a femoral to tibioperoneal trunk bypass using a PTFE distal flow graft.  She was noted to thrombose within the first 48 hours and subsequently underwent angiography with salvage of her bypass and stenting of the anterior tibial artery.  Following the second procedure which was May 01, 2023 she did well and was discharged to home approximately 10 days later. Wound vac change 12/23.   OT comments  Pt seen for OT tx. Pt politely declined EOB/OOB ADL citing having just recently been up to the bathroom and was going to walk with her sister later. Pt instructed in cognitive behavioral pain coping strategies to implement in addition to medical management of her pain, including progressive muscle relaxation and distraction. Pt educated in strategies to help pt with medication mgt in order to prevent letting pain get too severe. Pt verbalized understanding. Appreciative of instruction. Per pt's request, sent secure chat to RN re pain meds. Pt continues to benefit from skilled OT services.       If plan is discharge home, recommend the following:  A little help with walking and/or transfers;A little help with bathing/dressing/bathroom;Assistance with cooking/housework;Assist for transportation;Help with stairs or ramp for entrance   Equipment Recommendations  Tub/shower seat;Other (comment) (handheld shower head with on/off on nozzle, LH sponge, grab bar)    Recommendations for Other Services      Precautions / Restrictions Precautions Precautions: Fall Precaution Comments: wound vac groin/ post op shoe Restrictions Weight Bearing Restrictions Per Provider Order: No       Mobility Bed Mobility                General bed mobility comments: pt deferred stating she had just been up to the bathroom and was going for a walk later when her sister came to visit.    Transfers                         Balance                                           ADL either performed or assessed with clinical judgement   ADL                                              Extremity/Trunk Assessment              Vision       Perception     Praxis      Cognition Arousal: Alert Behavior During Therapy: WFL for tasks assessed/performed Overall Cognitive Status: Within Functional Limits for tasks assessed                                          Exercises Other Exercises Other Exercises: Pt instructed in cognitive behavioral pain coping strategies to implement in addition to medical management of her pain, including  progressive muscle relaxation and distraction. Pt educated in strategies to help pt with medication mgt in order to prevent letting pain get too severe.    Shoulder Instructions       General Comments      Pertinent Vitals/ Pain       Pain Assessment Pain Assessment: 0-10 Pain Score: 4  Pain Location: L groin Pain Descriptors / Indicators: Discomfort, Sore Pain Intervention(s): Monitored during session, Premedicated before session, Patient requesting pain meds-RN notified  Home Living                                          Prior Functioning/Environment              Frequency  Min 1X/week        Progress Toward Goals  OT Goals(current goals can now be found in the care plan section)  Progress towards OT goals: Progressing toward goals  Acute Rehab OT Goals Patient Stated Goal: get stronger and be independent OT Goal Formulation: With patient/family Time For Goal Achievement: 07/12/23 Potential to Achieve Goals: Good  Plan      Co-evaluation                  AM-PAC OT 6 Clicks Daily Activity     Outcome Measure   Help from another person eating meals?: None Help from another person taking care of personal grooming?: None Help from another person toileting, which includes using toliet, bedpan, or urinal?: A Little Help from another person bathing (including washing, rinsing, drying)?: A Little Help from another person to put on and taking off regular upper body clothing?: None Help from another person to put on and taking off regular lower body clothing?: A Little 6 Click Score: 21    End of Session    OT Visit Diagnosis: Other abnormalities of gait and mobility (R26.89)   Activity Tolerance Patient tolerated treatment well   Patient Left in bed;with call bell/phone within reach;with bed alarm set   Nurse Communication Patient requests pain meds        Time: 8545-8492 OT Time Calculation (min): 13 min  Charges: OT General Charges $OT Visit: 1 Visit OT Treatments $Therapeutic Activity: 8-22 mins  Warren SAUNDERS., MPH, MS, OTR/L ascom [phone] 07/09/23, 4:06 PM

## 2023-07-09 NOTE — Interval H&P Note (Signed)
 History and Physical Interval Note:  07/09/2023 7:16 AM  Laura Reed  has presented today for surgery, with the diagnosis of LEFT GROIN WOUND.  The various methods of treatment have been discussed with the patient and family. After consideration of risks, benefits and other options for treatment, the patient has consented to  Procedure(s): WOUND VAC EXCHANGE OF LEFT GROIN (Left) as a surgical intervention.  The patient's history has been reviewed, patient examined, no change in status, stable for surgery.  I have reviewed the patient's chart and labs.  Questions were answered to the patient's satisfaction.     [name]

## 2023-07-09 NOTE — Telephone Encounter (Signed)
 Patient cancelled her appointment on 07/15/2023 with Dr. Duncan Dull via MyChart and included the following message:  "I am in the hospital until at least January 15th (came into ER with sepsis on 12/15)."

## 2023-07-09 NOTE — Progress Notes (Signed)
 PT Cancellation Note  Patient Details Name: Laura Reed MRN: [id number] DOB: [birth year]   Cancelled Treatment:    Reason Eval/Treat Not Completed: Other (comment).  Pt s/p procedure this morning; will hold therapy at this time and re-attempt PT session at a later date/time.  Damien Caulk, PT 07/09/23, 11:35 AM

## 2023-07-09 NOTE — Progress Notes (Signed)
 OT Cancellation Note  Patient Details Name: SHIZUKO WOJDYLA MRN: [id number] DOB: [birth year]   Cancelled Treatment:    Reason Eval/Treat Not Completed: Patient at procedure or test/ unavailable. Pt out of the room. Per chart, scheduled for wound vac change. Will re-attempt as pt is available.   Leeloo Silverthorne R., MPH, MS, OTR/L ascom [phone] 07/09/23, 8:10 AM

## 2023-07-09 NOTE — Telephone Encounter (Signed)
 FYI

## 2023-07-09 NOTE — Anesthesia Postprocedure Evaluation (Signed)
 Anesthesia Post Note  Patient: Laura Reed  Procedure(s) Performed: WOUND VAC EXCHANGE OF LEFT GROIN (Left)  Patient location during evaluation: PACU Anesthesia Type: General Level of consciousness: awake and alert Pain management: pain level controlled Vital Signs Assessment: post-procedure vital signs reviewed and stable Respiratory status: spontaneous breathing, nonlabored ventilation, respiratory function stable and patient connected to nasal cannula oxygen Cardiovascular status: blood pressure returned to baseline and stable Postop Assessment: no apparent nausea or vomiting Anesthetic complications: no   No notable events documented.   Last Vitals:  Vitals:   07/09/23 0830 07/09/23 0845  BP: (!) 135/55 124/63  Pulse: (!) 57 60  Resp: 14 12  Temp:  (!) 36.3 C  SpO2: 98% 92%    Last Pain:  Vitals:   07/09/23 0820  TempSrc:   PainSc: 0-No pain                 [name]

## 2023-07-09 NOTE — Anesthesia Preprocedure Evaluation (Addendum)
 Anesthesia Evaluation  Patient identified by MRN, date of birth, ID band Patient awake    Reviewed: Allergy & Precautions, NPO status , Patient's Chart, lab work & pertinent test results  History of Anesthesia Complications Negative for: history of anesthetic complications  Airway Mallampati: III  TM Distance: >3 FB Neck ROM: full    Dental no notable dental hx. (+) Edentulous Upper, Edentulous Lower   Pulmonary neg COPD, neg recent URI, former smoker   Pulmonary exam normal breath sounds clear to auscultation       Cardiovascular hypertension, On Medications (-) angina + CAD, + CABG, + Peripheral Vascular Disease and +CHF  (-) Cardiac Stents + dysrhythmias (rate controlled) Atrial Fibrillation + Valvular Problems/Murmurs  Rhythm:Regular Rate:Normal - Systolic murmurs    Neuro/Psych neg Seizures PSYCHIATRIC DISORDERS Anxiety      Neuromuscular disease    GI/Hepatic Neg liver ROS,GERD  Controlled,,  Endo/Other  diabetes, Insulin  Dependent    Renal/GU      Musculoskeletal   Abdominal   Peds  Hematology negative hematology ROS (+)   Anesthesia Other Findings Past Medical History: No date: Absence of kidney     Comment:  left No date: Anxiety No date: Arthritis No date: Bladder cancer (HCC) No date: CHF (congestive heart failure) (HCC) No date: Complication of anesthesia     Comment:  BP HAS  RUN LOW AFTER SURGERY-LUNGS FILLED UP WITH FLUID              AFTER  LEG STENT SURGERY  No date: Coronary artery disease No date: Diabetes mellitus No date: Family history of adverse reaction to anesthesia     Comment:  Sister - PONV No date: GERD (gastroesophageal reflux disease)     Comment:  OCC TUMS No date: Heart murmur No date: Hemorrhoid 2007: History of methicillin resistant staphylococcus aureus (MRSA) No date: Hypertension No date: Neuropathy No date: PVD (peripheral vascular disease) (HCC) No date: Thyroid   nodule     Comment:  right 10/31/2014: Urothelial carcinoma of kidney (HCC)     Comment:  INVASIVE UROTHELIAL CARCINOMA, LOW GRADE. T1, Nx. No date: Wears dentures     Comment:  full upper and lower  Past Surgical History: No date: AMPUTATION TOE     Comment:  right (4th and 5th); left (great toe, 3rd) 07/16/2018: AMPUTATION TOE; Right     Comment:  Procedure: AMPUTATION TOE/MPJ right 2nd;  Surgeon:               Neill Boas, DPM;  Location: [facility];  Service:               Podiatry;  Laterality: Right; 2009, 2013 x 2: ARTERIAL BYPASS SURGRY     Comment:  right leg , done in [place] No date: CARDIAC CATHETERIZATION 01/2014: CAROTID ENDARTERECTOMY; Right     Comment:  Dr Jama 12/14/2014: CATARACT EXTRACTION W/PHACO; Right     Comment:  Procedure: CATARACT EXTRACTION PHACO AND INTRAOCULAR               LENS PLACEMENT (IOC);  Surgeon: Newell Ovens, MD;                Location: [facility];  Service: Ophthalmology;  Laterality:              Right;  US    00:38.6              AP        7.1  CDE  2.76 12/06/2019: CATARACT EXTRACTION W/PHACO; Left     Comment:  Procedure: CATARACT EXTRACTION PHACO AND INTRAOCULAR               LENS PLACEMENT (IOC) LEFT DIABETIC;  Surgeon: Jaye Fallow, MD;  Location: [facility];  Service:               Ophthalmology;  Laterality: Left;  9.08 1:06.4 No date: CESAREAN SECTION 03-03-12: CHOLECYSTECTOMY     Comment:  Porcelain gallbladder, gallstones,  [place] 04/28/2012: COLONOSCOPY W/ BIOPSIES     Comment:  Hyperplastic rectal polyps. 04/02/2022: COLONOSCOPY WITH PROPOFOL ; N/A     Comment:  Procedure: COLONOSCOPY WITH PROPOFOL ;  Surgeon: Dessa Reyes ORN, MD;  Location: [facility];  Service:               Endoscopy;  Laterality: N/A; 2009: CORONARY ARTERY BYPASS GRAFT     Comment:  3 vessel 09/01/2016: CYSTOSCOPY W/ RETROGRADES; Right     Comment:  Procedure: CYSTOSCOPY WITH RETROGRADE  PYELOGRAM;                Surgeon: Rosina Riis, MD;  Location: [facility];                Service: Urology;  Laterality: Right; 03/19/2020: CYSTOSCOPY W/ RETROGRADES; Bilateral     Comment:  Procedure: CYSTOSCOPY WITH RETROGRADE PYELOGRAM;                Surgeon: Riis Rosina, MD;  Location: [facility];                Service: Urology;  Laterality: Bilateral; 03/19/2020: CYSTOSCOPY WITH BIOPSY; N/A     Comment:  Procedure: CYSTOSCOPY WITH BIOPSY;  Surgeon: Riis Rosina, MD;  Location: [facility];  Service: Urology;                Laterality: N/A; No date: EYE SURGERY 10-31-14: HERNIA REPAIR     Comment:  ventral, retro-rectus atrium mesh 01/18/2019: IRRIGATION AND DEBRIDEMENT FOOT; Left     Comment:  Procedure: IRRIGATION AND DEBRIDEMENT FOOT;  Surgeon:               Neill Boas, DPM;  Location: [facility];  Service:               Podiatry;  Laterality: Left; 12/10/2016: LOWER EXTREMITY ANGIOGRAPHY; Left     Comment:  Procedure: Lower Extremity Angiography;  Surgeon:               Jama Cordella MATSU, MD;  Location: [facility];               Service: Cardiovascular;  Laterality: Left; 02/02/2018: LOWER EXTREMITY ANGIOGRAPHY; Left     Comment:  Procedure: LOWER EXTREMITY ANGIOGRAPHY;  Surgeon:               Jama Cordella MATSU, MD;  Location: [facility];               Service: Cardiovascular;  Laterality: Left; 05/05/2018: LOWER EXTREMITY ANGIOGRAPHY; Left     Comment:  Procedure: LOWER EXTREMITY ANGIOGRAPHY;  Surgeon:               Jama Cordella MATSU, MD;  Location: [facility]  [facility];               Service: Cardiovascular;  Laterality: Left; 12/04/2020: LOWER EXTREMITY ANGIOGRAPHY; Left     Comment:  Procedure: LOWER EXTREMITY ANGIOGRAPHY with               Intervention;  Surgeon: Jama Cordella MATSU, MD;                Location: [facility];  Service: Cardiovascular;              Laterality: Left; 04/24/2023: LOWER EXTREMITY ANGIOGRAPHY; Left      Comment:  Procedure: Lower Extremity Angiography;  Surgeon:               Jama Cordella MATSU, MD;  Location: [facility];               Service: Cardiovascular;  Laterality: Left; 10-31-14: NEPHRECTOMY; Left 05/01/2015: PERIPHERAL VASCULAR CATHETERIZATION; Left     Comment:  Procedure: Lower Extremity Angiography;  Surgeon:               Cordella MATSU Jama, MD;  Location: [facility];                Service: Cardiovascular;  Laterality: Left; 05/01/2015: PERIPHERAL VASCULAR CATHETERIZATION     Comment:  Procedure: Lower Extremity Intervention;  Surgeon:               Cordella MATSU Jama, MD;  Location: [facility];                Service: Cardiovascular;; 02/20/2015: PERIPHERAL VASCULAR CATHETERIZATION; Left     Comment:  Procedure: Pelvic Angiography;  Surgeon: Cordella MATSU Jama, MD;  Location: [facility];  Service:               Cardiovascular;  Laterality: Left; 09/01/2016: TRANSURETHRAL RESECTION OF BLADDER TUMOR WITH MITOMYCIN -C;  N/A     Comment:  Procedure: TRANSURETHRAL RESECTION OF BLADDER TUMOR WITH              MITOMYCIN -C;  Surgeon: Rosina Riis, MD;  Location:               [facility];  Service: Urology;  Laterality: N/A;  BMI    Body Mass Index: 21.13 kg/m      Reproductive/Obstetrics negative OB ROS                             Anesthesia Physical Anesthesia Plan  ASA: 3  Anesthesia Plan: General   Post-op Pain Management: Minimal or no pain anticipated   Induction: Intravenous  PONV Risk Score and Plan: 3  Airway Management Planned: Natural Airway and Nasal Cannula  Additional Equipment:   Intra-op Plan:   Post-operative Plan:   Informed Consent: I have reviewed the patients History and Physical, chart, labs and discussed the procedure including the risks, benefits and alternatives for the proposed anesthesia with the patient or authorized representative who has indicated his/her  understanding and acceptance.     Dental Advisory Given  Plan Discussed with: Anesthesiologist, CRNA and Surgeon  Anesthesia Plan Comments: (Patient consented for risks of anesthesia including but not limited to:  - adverse reactions to medications - risk of airway placement if required - damage to eyes, teeth, lips or other oral mucosa - nerve  damage due to positioning  - sore throat or hoarseness - Damage to heart, brain, nerves, lungs, other parts of body or loss of life  Patient voiced understanding and assent.)       Anesthesia Quick Evaluation

## 2023-07-09 NOTE — Progress Notes (Signed)
 Progress Note   Patient: Laura Reed FMW:[id number] DOB: December 27, 1960 DOA: 06/21/2023     18 DOS: the patient was seen and examined on 07/09/2023      Brief hospital course:  HPI: 64 y.o female with significant PMH of HTN, HLD, DM, sCHF, CAD, PAD (s/p of left common femoral-distal bypass, thrombectomy, angioplasty and stent placement 04/2023 on aspirin , Plavix  and Eliquis ), anxiety, bladder cancer (s/p of nephrectomy). [facility] on 06/21/23 with complaints of right groin pain, redness and oozing post op. Patient has hx of PAD and recently underwent left common femoral-distal bypass, thrombectomy, angioplasty and stent placement 04/2023 and right transmetatarsal amputation. SBP in the 50s and was sent to the ED for further evaluation.  Patient was initially admitted to hospitalist service later transferred to ICU for pressor support.  Patient has undergone multiple trips to the OR for wound debridement and wound VAC exchange.     Consultants:  [facility] Vascular Surgery  Infectious disease    Procedures/Surgeries: 06/24/23: Excisional debridement of left groin abscess. Rotation of a sartorius muscle flap for coverage of the existing bypass grafts. Placement of a nondisposable wound VAC dressing left groin. Redo vascular surgery - Drs. [name] and Dew 06/26/23: Excisional debridement left groin wound. Placement of a nondisposable VAC dressing - Dr Jama 06/29/23: Left groin VAC dressing change - Dr Jama   07/02/23: Irrigation and debridement of skin, soft tissue, and muscle for approximately 125 cm to the left groin wound, Pulse lavage irrigation left groin. Left groin VAC dressing change - Dr Marea       ASSESSMENT & PLAN:   Left groin abscess Sepsis with septic shock resolved Status post I&D with vascular surgery 12/18 with takeback to the OR for left groin debridement, muscle flap, wound VAC exchange 12/20, wound vac exchange 12/23 ID on board and has recommended antibiotics with  end date on 07/22/2023 Pain control Patient underwent wound VAC exchange on 07/06/2023 Vascular surgeon planning another wound VAC exchange today   PAD s/p of left common femoral-tibioperoneal trunk bypass, thrombectomy, angioplasty and stent placement 04/2023  on aspirin , Plavix  and Eliquis   Continue eliquis  and aspirin  Continue statin therapy Continue aspirin    Leukocytosis-resolved Continue current antibiotics   Normocytic anemia Expected ABLA d/t surgeries normal hemoglobin prior to vascular procedures.   some blood tinge output in her wound VAC S/p 1 unit of packed red blood cells by vascular team.  Can discharge on iron supplemntation  Monitor CBC closely   AKI - resolved Likely d/t septic shock resolved Continue to monitor renal function   Hypokalemia D/t Lasix   Replace as needed Monitor BMP   PVC Patient noted to have frequent PVCs on tele This is likely causing her heart rate to appear to be lower than normal.  Will ensure potassium >4, Magnesium >2 Continue to monitor   Hyponatremia  Resolved Monitor sodium level   Chronic diastolic HF Edema bilateral LE Monitor fluid status  Diuresis prn  Monitor BMP w/ diuresis   Hypoalbuminemia  Encourage po intake    HTN  HLD Continue statin Hold home antihypertensives. Mostly normotensive off of midodrine  Continue to monitor blood pressure   T2DM  A1c 8 Continue long-acting 15 units Continue mealtime 3 units 3 times daily       DVT prophylaxis: ELiquis     Central lines / invasive devices: wound vac    Code Status: FULL CODE   TOC needs: home health  Barriers to dispo / significant pending items: wound vac in place,  iv abx, planning wound vac change in OR      Subjective Patient seen sitting up in a chair with no complaints Being followed up by vascular surgeon Patient underwent wound VAC exchange today According to vascular surgery patient will be here for about 2 more weeks   Family  Communication: support person is at bedside on rounds       Physical Exam Constitutional:      General: She is not in acute distress. Cardiovascular:     Rate and Rhythm: Normal rate and regular rhythm.  Pulmonary:     Effort: Pulmonary effort is normal.     Breath sounds: Normal breath sounds.  Musculoskeletal: Wound VAC in place Neurological:     General: No focal deficit present.     Mental Status: She is alert and oriented to person, place, and time. Mental status is at baseline.  Psychiatric:        Mood and Affect: Mood normal.        Behavior: Behavior normal.     Data Reviewed:     Latest Ref Rng & Units 07/09/2023    5:00 AM 07/08/2023    4:46 AM 07/07/2023    4:48 AM  BMP  Glucose 70 - 99 mg/dL 867  859  66   BUN 8 - 23 mg/dL 17  16  21    Creatinine 0.44 - 1.00 mg/dL 9.40  9.31  9.27   Sodium 135 - 145 mmol/L 136  136  136   Potassium 3.5 - 5.1 mmol/L 3.5  3.9  4.0   Chloride 98 - 111 mmol/L 102  100  102   CO2 22 - 32 mmol/L 24  29  28    Calcium  8.9 - 10.3 mg/dL 7.9  8.3  8.1     Vitals:   07/09/23 0830 07/09/23 0845 07/09/23 1125 07/09/23 1709  BP: (!) 135/55 124/63 126/80 (!) 137/55  Pulse: (!) 57 60 66 68  Resp: 14 12 16 18   Temp:  (!) 97.4 F (36.3 C) 97.7 F (36.5 C) 98.3 F (36.8 C)  TempSrc:   Oral Oral  SpO2: 98% 92% 95% 97%  Weight:      Height:          Latest Ref Rng & Units 07/09/2023    5:00 AM 07/08/2023    4:46 AM 07/07/2023    4:48 AM  CBC  WBC 4.0 - 10.5 K/uL 9.0  8.9  10.2   Hemoglobin 12.0 - 15.0 g/dL 7.7  8.5  8.1   Hematocrit 36.0 - 46.0 % 22.6  25.8  24.4   Platelets 150 - 400 K/uL 323  354  360      Author: Drue ONEIDA Potter, MD 07/09/2023 6:03 PM  For on call review [email or web address].

## 2023-07-09 NOTE — Plan of Care (Signed)
  Problem: Education: Goal: Ability to describe self-care measures that may prevent or decrease complications (Diabetes Survival Skills Education) will improve Outcome: Progressing   Problem: Clinical Measurements: Goal: Ability to maintain clinical measurements within normal limits will improve Outcome: Progressing   Problem: Activity: Goal: Risk for activity intolerance will decrease Outcome: Progressing   Problem: Nutrition: Goal: Adequate nutrition will be maintained Outcome: Progressing   Problem: Elimination: Goal: Will not experience complications related to bowel motility Outcome: Progressing Goal: Will not experience complications related to urinary retention Outcome: Progressing   Problem: Pain Management: Goal: General experience of comfort will improve Outcome: Progressing   Problem: Safety: Goal: Ability to remain free from injury will improve Outcome: Progressing

## 2023-07-10 ENCOUNTER — Other Ambulatory Visit: Payer: Self-pay

## 2023-07-10 ENCOUNTER — Ambulatory Visit: Payer: 59

## 2023-07-10 ENCOUNTER — Encounter: Payer: Self-pay | Admitting: Vascular Surgery

## 2023-07-10 DIAGNOSIS — I739 Peripheral vascular disease, unspecified: Secondary | ICD-10-CM | POA: Diagnosis not present

## 2023-07-10 DIAGNOSIS — T827XXA Infection and inflammatory reaction due to other cardiac and vascular devices, implants and grafts, initial encounter: Secondary | ICD-10-CM | POA: Diagnosis not present

## 2023-07-10 DIAGNOSIS — D72829 Elevated white blood cell count, unspecified: Secondary | ICD-10-CM | POA: Diagnosis not present

## 2023-07-10 DIAGNOSIS — N179 Acute kidney failure, unspecified: Secondary | ICD-10-CM | POA: Diagnosis not present

## 2023-07-10 DIAGNOSIS — L02214 Cutaneous abscess of groin: Secondary | ICD-10-CM | POA: Diagnosis not present

## 2023-07-10 DIAGNOSIS — B9620 Unspecified Escherichia coli [E. coli] as the cause of diseases classified elsewhere: Secondary | ICD-10-CM | POA: Diagnosis not present

## 2023-07-10 DIAGNOSIS — M79605 Pain in left leg: Secondary | ICD-10-CM | POA: Diagnosis not present

## 2023-07-10 DIAGNOSIS — D649 Anemia, unspecified: Secondary | ICD-10-CM

## 2023-07-10 LAB — BASIC METABOLIC PANEL
Anion gap: 8 (ref 5–15)
BUN: 16 mg/dL (ref 8–23)
CO2: 27 mmol/L (ref 22–32)
Calcium: 7.7 mg/dL — ABNORMAL LOW (ref 8.9–10.3)
Chloride: 102 mmol/L (ref 98–111)
Creatinine, Ser: 0.63 mg/dL (ref 0.44–1.00)
GFR, Estimated: >60 mL/min (ref >60)
Glucose, Bld: 54 mg/dL — ABNORMAL LOW (ref 70–99)
Potassium: 3.5 mmol/L (ref 3.5–5.1)
Sodium: 137 mmol/L (ref 135–145)

## 2023-07-10 LAB — GLUCOSE, CAPILLARY
Glucose-Capillary: 75 mg/dL (ref 70–99)
Glucose-Capillary: 111 mg/dL — ABNORMAL HIGH (ref 70–99)
Glucose-Capillary: 144 mg/dL — ABNORMAL HIGH (ref 70–99)
Glucose-Capillary: 126 mg/dL — ABNORMAL HIGH (ref 70–99)

## 2023-07-10 LAB — CBC WITH DIFFERENTIAL/PLATELET
Abs Immature Granulocytes: 0.06 10*3/uL (ref 0.00–0.07)
Basophils Absolute: 0.1 10*3/uL (ref 0.0–0.1)
Basophils Relative: 1 %
Eosinophils Absolute: 0.2 10*3/uL (ref 0.0–0.5)
Eosinophils Relative: 3 %
HCT: 23.2 % — ABNORMAL LOW (ref 36.0–46.0)
Hemoglobin: 7.8 g/dL — ABNORMAL LOW (ref 12.0–15.0)
Immature Granulocytes: 1 %
Lymphocytes Relative: 15 %
Lymphs Abs: 1.3 10*3/uL (ref 0.7–4.0)
MCH: 28.8 pg (ref 26.0–34.0)
MCHC: 33.6 g/dL (ref 30.0–36.0)
MCV: 85.6 fL (ref 80.0–100.0)
Monocytes Absolute: 0.8 10*3/uL (ref 0.1–1.0)
Monocytes Relative: 9 %
Neutro Abs: 6.3 10*3/uL (ref 1.7–7.7)
Neutrophils Relative %: 71 %
Platelets: 338 10*3/uL (ref 150–400)
RBC: 2.71 MIL/uL — ABNORMAL LOW (ref 3.87–5.11)
RDW: 14.1 % (ref 11.5–15.5)
WBC: 8.7 10*3/uL (ref 4.0–10.5)
nRBC: 0 % (ref 0.0–0.2)

## 2023-07-10 MED ORDER — FE FUM-VIT C-VIT B12-FA 460-60-0.01-1 MG PO CAPS
1.0000 | ORAL_CAPSULE | Freq: Two times a day (BID) | ORAL | Status: DC
  Administered 2023-07-10 – 2023-07-23 (×22): 1 via ORAL
  Filled 2023-07-10 (×30): qty 1

## 2023-07-10 NOTE — Progress Notes (Signed)
 Progress Note   Patient: Laura Reed FMW:[id number] DOB: 18-Oct-1960 DOA: 06/21/2023     19 DOS: the patient was seen and examined on 07/10/2023      Brief hospital course:  HPI: 63 y.o female with significant PMH of HTN, HLD, DM, sCHF, CAD, PAD (s/p of left common femoral-distal bypass, thrombectomy, angioplasty and stent placement 04/2023 on aspirin , Plavix  and Eliquis ), anxiety, bladder cancer (s/p of nephrectomy). [facility] on 06/21/23 with complaints of right groin pain, redness and oozing post op. Patient has hx of PAD and recently underwent left common femoral-distal bypass, thrombectomy, angioplasty and stent placement 04/2023 and right transmetatarsal amputation. SBP in the 50s and was sent to the ED for further evaluation.  Patient was initially admitted to hospitalist service later transferred to ICU for pressor support.  Patient has undergone multiple trips to the OR for wound debridement and wound VAC exchange.  1/3: Hemodynamically stable.  Going back to the OR on Monday.  Patient will need need wound VAC for many weeks. TOC having issues finding and home health agency.     Consultants:  [facility] Vascular Surgery  Infectious disease    Procedures/Surgeries: 06/24/23: Excisional debridement of left groin abscess. Rotation of a sartorius muscle flap for coverage of the existing bypass grafts. Placement of a nondisposable wound VAC dressing left groin. Redo vascular surgery - Drs. [name] and Dew 06/26/23: Excisional debridement left groin wound. Placement of a nondisposable VAC dressing - Dr Jama 06/29/23: Left groin VAC dressing change - Dr Jama   07/02/23: Irrigation and debridement of skin, soft tissue, and muscle for approximately 125 cm to the left groin wound, Pulse lavage irrigation left groin. Left groin VAC dressing change - Dr Marea 07/10/2023: Going back to the OR for wound VAC change on Monday.       ASSESSMENT & PLAN:   Left groin abscess Sepsis with  septic shock resolved Status post I&D with vascular surgery 12/18 with takeback to the OR for left groin debridement, muscle flap, wound VAC exchange 12/20, wound vac exchange 12/23 ID on board and has recommended antibiotics with end date on 07/22/2023 Pain control Patient underwent wound VAC exchange on 07/06/2023 Vascular surgeon planning another wound VAC exchange on Monday   PAD s/p of left common femoral-tibioperoneal trunk bypass, thrombectomy, angioplasty and stent placement 04/2023  on aspirin , Plavix  and Eliquis   Continue eliquis  and aspirin  Continue statin therapy Continue aspirin    Leukocytosis-resolved Continue current antibiotics   Normocytic anemia Expected ABLA d/t surgeries normal hemoglobin prior to vascular procedures.   some blood tinge output in her wound VAC S/p 1 unit of packed red blood cells by vascular team.  Hemoglobin at 7.8 today -Starting on supplement -Continue to monitor -Transfuse below 7  AKI - resolved Likely d/t septic shock resolved Continue to monitor renal function   Hypokalemia D/t Lasix   Replace as needed Monitor BMP   PVC Patient noted to have frequent PVCs on tele This is likely causing her heart rate to appear to be lower than normal.  Will ensure potassium >4, Magnesium >2 Continue to monitor   Hyponatremia  Resolved Monitor sodium level   Chronic diastolic HF Edema bilateral LE Monitor fluid status  Diuresis prn  Monitor BMP w/ diuresis   Hypoalbuminemia  Encourage po intake    HTN  HLD Continue statin Hold home antihypertensives. Mostly normotensive off of midodrine  Continue to monitor blood pressure   T2DM  A1c 8 Continue long-acting 15 units Continue mealtime 3 units 3  times daily       DVT prophylaxis: ELiquis     Central lines / invasive devices: wound vac    Code Status: FULL CODE   TOC needs: home health  Barriers to dispo / significant pending items: wound vac in place, iv abx, planning wound  vac change in OR      Subjective Patient was seen and examined today.  No new concern.   Family Communication: Discussed with patient     Physical Exam General.  Well-developed lady, in no acute distress. Pulmonary.  Lungs clear bilaterally, normal respiratory effort. CV.  Regular rate and rhythm, no JVD, rub or murmur. Abdomen.  Soft, nontender, nondistended, BS positive. CNS.  Alert and oriented .  No focal neurologic deficit. Extremities.  1+ right LE edema, 2+ left LE edema, wound VAC on left groin Psychiatry.  Judgment and insight appears normal.     Data Reviewed:     Latest Ref Rng & Units 07/10/2023    5:06 AM 07/09/2023    5:00 AM 07/08/2023    4:46 AM  BMP  Glucose 70 - 99 mg/dL 54  867  859   BUN 8 - 23 mg/dL 16  17  16    Creatinine 0.44 - 1.00 mg/dL 9.36  9.40  9.31   Sodium 135 - 145 mmol/L 137  136  136   Potassium 3.5 - 5.1 mmol/L 3.5  3.5  3.9   Chloride 98 - 111 mmol/L 102  102  100   CO2 22 - 32 mmol/L 27  24  29    Calcium  8.9 - 10.3 mg/dL 7.7  7.9  8.3     Vitals:   07/09/23 2314 07/10/23 0311 07/10/23 0500 07/10/23 0744  BP: (!) 112/45 (!) 117/48  (!) 114/46  Pulse: 62 (!) 59  62  Resp: 18 18  14   Temp: 97.7 F (36.5 C) 97.6 F (36.4 C)  97.7 F (36.5 C)  TempSrc: Oral Oral  Oral  SpO2: 93% 94%  96%  Weight:   66.7 kg   Height:          Latest Ref Rng & Units 07/10/2023    5:06 AM 07/09/2023    5:00 AM 07/08/2023    4:46 AM  CBC  WBC 4.0 - 10.5 K/uL 8.7  9.0  8.9   Hemoglobin 12.0 - 15.0 g/dL 7.8  7.7  8.5   Hematocrit 36.0 - 46.0 % 23.2  22.6  25.8   Platelets 150 - 400 K/uL 338  323  354      Author: Amaryllis Dare, MD 07/10/2023 1:46 PM  For on call review [email or web address].

## 2023-07-10 NOTE — Progress Notes (Signed)
 Subjective  -   No complaints today.  She underwent VAC change yesterday   Physical Exam:  VAC with good seal Extremities warm and well-perfused       Assessment/Plan:    Plan for return trip to the operating room on Monday for a wound VAC change  Laura Reed 07/10/2023 3:18 PM --  Vitals:   07/10/23 0311 07/10/23 0744  BP: (!) 117/48 (!) 114/46  Pulse: (!) 59 62  Resp: 18 14  Temp: 97.6 F (36.4 C) 97.7 F (36.5 C)  SpO2: 94% 96%    Intake/Output Summary (Last 24 hours) at 07/10/2023 1518 Last data filed at 07/10/2023 9375 Gross per 24 hour  Intake 678.42 ml  Output 1300 ml  Net -621.58 ml     Laboratory CBC    Component Value Date/Time   WBC 8.7 07/10/2023 0506   HGB 7.8 (L) 07/10/2023 0506   HGB 9.5 (L) 11/02/2014 0609   HCT 23.2 (L) 07/10/2023 0506   HCT 29.5 (L) 11/02/2014 0609   PLT 338 07/10/2023 0506   PLT 217 11/02/2014 0609    BMET    Component Value Date/Time   NA 137 07/10/2023 0506   NA 136 08/02/2021 1629   NA 135 11/02/2014 0609   K 3.5 07/10/2023 0506   K 4.2 11/02/2014 0609   CL 102 07/10/2023 0506   CL 107 11/02/2014 0609   CO2 27 07/10/2023 0506   CO2 23 11/02/2014 0609   GLUCOSE 54 (L) 07/10/2023 0506   GLUCOSE 108 (H) 11/02/2014 0609   BUN 16 07/10/2023 0506   BUN 25 08/02/2021 1629   BUN 20 11/02/2014 0609   CREATININE 0.63 07/10/2023 0506   CREATININE 1.01 11/09/2015 1549   CREATININE 1.01 11/09/2015 1549   CALCIUM  7.7 (L) 07/10/2023 0506   CALCIUM  7.3 (L) 11/02/2014 0609   GFRNONAA >60 07/10/2023 0506   GFRNONAA 50 (L) 11/02/2014 0609   GFRAA >60 01/19/2019 0357   GFRAA 58 (L) 11/02/2014 0609    COAG Lab Results  Component Value Date   INR 1.1 06/21/2023   INR 0.9 10/17/2014   INR 1.1 01/19/2014   No results found for: PTT  Antibiotics Anti-infectives (From admission, onward)    Start     Dose/Rate Route Frequency Ordered Stop   07/09/23 0600  ceFAZolin  (ANCEF ) IVPB 2g/100 mL premix        2  g 200 mL/hr over 30 Minutes Intravenous On call to O.R. 07/08/23 2027 07/09/23 0748   07/06/23 0831  gentamicin  (GARAMYCIN ) injection  Status:  Discontinued          As needed 07/06/23 0831 07/06/23 0848   06/29/23 0620  ceFAZolin  (ANCEF ) IVPB 2g/100 mL premix        2 g 200 mL/hr over 30 Minutes Intravenous 30 min pre-op 06/29/23 0620 06/29/23 0747   06/26/23 1800  Ampicillin -Sulbactam (UNASYN ) 3 g in sodium chloride  0.9 % 100 mL IVPB        3 g 200 mL/hr over 30 Minutes Intravenous Every 6 hours 06/26/23 1646     06/24/23 1322  gentamicin  (GARAMYCIN ) injection  Status:  Discontinued          As needed 06/24/23 1322 06/24/23 1347   06/24/23 1303  tobramycin  (NEBCIN ) powder  Status:  Discontinued          As needed 06/24/23 1303 06/24/23 1347   06/24/23 1303  vancomycin  (VANCOCIN ) powder  Status:  Discontinued  As needed 06/24/23 1304 06/24/23 1347   06/24/23 1200  vancomycin  (VANCOREADY) IVPB 1250 mg/250 mL  Status:  Discontinued       Placed in Followed by Linked Group   1,250 mg 166.7 mL/hr over 90 Minutes Intravenous Every 24 hours 06/23/23 1244 06/23/23 1651   06/24/23 1000  DAPTOmycin  (CUBICIN ) IVPB 500 mg/50mL premix  Status:  Discontinued        8 mg/kg  64.1 kg 100 mL/hr over 30 Minutes Intravenous Daily 06/23/23 1651 06/26/23 1646   06/24/23 0234  ceFAZolin  (ANCEF ) IVPB 2g/100 mL premix        2 g 200 mL/hr over 30 Minutes Intravenous 30 min pre-op 06/24/23 0234 06/24/23 1300   06/23/23 1800  ceFEPIme  (MAXIPIME ) 2 g in sodium chloride  0.9 % 100 mL IVPB  Status:  Discontinued        2 g 200 mL/hr over 30 Minutes Intravenous Every 8 hours 06/23/23 1242 06/26/23 1646   06/23/23 1330  vancomycin  (VANCOREADY) IVPB 1250 mg/250 mL  Status:  Discontinued       Placed in Followed by Linked Group   1,250 mg 166.7 mL/hr over 90 Minutes Intravenous Every 24 hours 06/23/23 1242 06/23/23 1244   06/23/23 1201  vancomycin  (VANCOCIN ) IVPB 1000 mg/200 mL premix  Status:   Discontinued       Placed in Followed by Linked Group   1,000 mg 200 mL/hr over 60 Minutes Intravenous Every 24 hours 06/22/23 0042 06/23/23 1242   06/22/23 2200  ceFEPIme  (MAXIPIME ) 2 g in sodium chloride  0.9 % 100 mL IVPB  Status:  Discontinued        2 g 200 mL/hr over 30 Minutes Intravenous Every 12 hours 06/22/23 2120 06/23/23 1242   06/22/23 0200  piperacillin -tazobactam (ZOSYN ) IVPB 3.375 g  Status:  Discontinued        3.375 g 12.5 mL/hr over 240 Minutes Intravenous Every 8 hours 06/22/23 0042 06/22/23 2117   06/22/23 0045  vancomycin  (VANCOCIN ) IVPB 1000 mg/200 mL premix       Placed in Followed by Linked Group   1,000 mg 200 mL/hr over 60 Minutes Intravenous STAT 06/22/23 0042 06/22/23 0254        V. Malvina Serene CLORE, M.D., [facility] [place] Office: [phone] Pager:  [phone]

## 2023-07-10 NOTE — Progress Notes (Signed)
 Physical Therapy Treatment Patient Details Name: Laura Reed MRN: [id number] DOB: 1960-09-11 Today's Date: 07/10/2023   History of Present Illness Laura Reed  has presented today for surgery, with the diagnosis of Groin Infection. S/P I & D left groin. On April 29, 2023 she underwent a femoral to tibioperoneal trunk bypass using a PTFE distal flow graft.  She was noted to thrombose within the first 48 hours and subsequently underwent angiography with salvage of her bypass and stenting of the anterior tibial artery.  Following the second procedure which was May 01, 2023 she did well and was discharged to home approximately 10 days later. Wound vac change 12/23.  S/p 07/06/23 wound VAC washout and change.  S/p 07/09/23 I&D of skin, soft tissue, and muscle (for approximately 75 cm2) to L groin; application of Kerecis biologic to wound to L groin; and placement of nondisposable negative pressure dressing.    PT Comments  Pt sitting on EOB upon PT arrival; visitor present; pt agreeable to therapy; pt reporting walking in hallway with RW already this morning.  Trialed pt with SPC--pt requiring CGA for safety d/t mild unsteadiness; vc's required for technique.  Pt reporting RW was a lot easier than SPC use but requesting to continue to trial [facility] use during hospitalization to improve technique.  Pt then ambulated with RW modified independently in hallway (pt demonstrating safe and steady functional mobility).  Mild 1/10 L groin pain reported during session.   If plan is discharge home, recommend the following: A little help with walking and/or transfers;A little help with bathing/dressing/bathroom;Assistance with cooking/housework;Help with stairs or ramp for entrance;Assist for transportation   Can travel by private vehicle      Yes  Equipment Recommendations  Rolling walker (2 wheels)    Recommendations for Other Services       Precautions / Restrictions Precautions Precautions:  Fall Precaution Comments: wound vac groin/ post op shoe Restrictions Weight Bearing Restrictions Per Provider Order: No     Mobility  Bed Mobility               General bed mobility comments: Deferred (pt sitting on EOB beginning/end of session)    Transfers Overall transfer level: Modified independent Equipment used: Rolling walker (2 wheels), Straight cane Transfers: Sit to/from Stand Sit to Stand: Modified independent (Device/Increase time)           General transfer comment: modified independent standing from bed x1 trial with SPC and x1 trial with RW; steady and safe    Ambulation/Gait Ambulation/Gait assistance: Contact guard assist, Modified independent (Device/Increase time) Gait Distance (Feet):  (40 feet with SPC; 200 feet with RW) Assistive device: Rolling walker (2 wheels), Straight cane   Gait velocity: decreased     General Gait Details: Mostly step through gait pattern with RW use, mild decreased stance time L LE (steady and safe; modified independent).  CGA ambulating shorter distance with SPC (vc's for technique, gait pattern, and SPC use; pt utilized 3 point pattern; CGA for safety d/t pt mildly unsteady)   Stairs             Wheelchair Mobility     Tilt Bed    Modified Rankin (Stroke Patients Only)       Balance Overall balance assessment: Modified Independent Sitting-balance support: No upper extremity supported, Feet supported Sitting balance-Leahy Scale: Normal Sitting balance - Comments: steady reaching outside BOS   Standing balance support: Bilateral upper extremity supported, During functional activity, Reliant on assistive device  for balance Standing balance-Leahy Scale: Good Standing balance comment: steady ambulating with RW use                            Cognition Arousal: Alert Behavior During Therapy: WFL for tasks assessed/performed Overall Cognitive Status: Within Functional Limits for tasks  assessed                                 General Comments: Pt is A and O x 4. Cooperative and pleasant        Exercises      General Comments General comments (skin integrity, edema, etc.): L groin wound vac in place and appearing intact beginning/end of session.  Nursing cleared pt for participation in physical therapy.  Pt agreeable to PT session.      Pertinent Vitals/Pain Pain Assessment Pain Assessment: 0-10 Pain Score: 1  Pain Location: L groin Pain Descriptors / Indicators: Discomfort, Sore Pain Intervention(s): Limited activity within patient's tolerance, Monitored during session, Repositioned HR 73 bpm and SpO2 sats 98% on room air end of session at rest.    Home Living                          Prior Function            PT Goals (current goals can now be found in the care plan section) Acute Rehab PT Goals Patient Stated Goal: to return home PT Goal Formulation: With patient Time For Goal Achievement: 07/24/23 Potential to Achieve Goals: Good Progress towards PT goals: Progressing toward goals    Frequency    Min 1X/week      PT Plan      Co-evaluation              AM-PAC PT 6 Clicks Mobility   Outcome Measure  Help needed turning from your back to your side while in a flat bed without using bedrails?: None Help needed moving from lying on your back to sitting on the side of a flat bed without using bedrails?: None Help needed moving to and from a bed to a chair (including a wheelchair)?: None Help needed standing up from a chair using your arms (e.g., wheelchair or bedside chair)?: None Help needed to walk in hospital room?: A Little Help needed climbing 3-5 steps with a railing? : A Little 6 Click Score: 22    End of Session Equipment Utilized During Treatment: Gait belt Activity Tolerance: Patient tolerated treatment well Patient left: with call bell/phone within reach (sitting on EOB) Nurse Communication:  Mobility status;Precautions PT Visit Diagnosis: Other abnormalities of gait and mobility (R26.89);Unsteadiness on feet (R26.81);Pain;Difficulty in walking, not elsewhere classified (R26.2);Muscle weakness (generalized) (M62.81) Pain - Right/Left: Left Pain - part of body: Hip     Time: 8878-8852 PT Time Calculation (min) (ACUTE ONLY): 26 min  Charges:    $Gait Training: 23-37 mins PT General Charges $$ ACUTE PT VISIT: 1 Visit                     Damien Caulk, PT 07/10/23, 12:11 PM

## 2023-07-10 NOTE — Progress Notes (Signed)
 ID Pt doing wellr Santina for vac change and also had some debridement of non viable tissue Sister at bed side   O/e awake and alert Patient Vitals for the past 24 hrs:  BP Temp Temp src Pulse Resp SpO2 Weight  07/10/23 0744 (!) 114/46 97.7 F (36.5 C) Oral 62 14 96 % --  07/10/23 0500 -- -- -- -- -- -- 66.7 kg  07/10/23 0311 (!) 117/48 97.6 F (36.4 C) Oral (!) 59 18 94 % --  07/09/23 2314 (!) 112/45 97.7 F (36.5 C) Oral 62 18 93 % --  07/09/23 1709 (!) 137/55 98.3 F (36.8 C) Oral 68 18 97 % --   Chest CTA Hss1s2 Abd soft Left groin wound  picture reviewed The wound had decreased in size and now measured at its longest length 15 cm, at its widest width 6 cm, and was now about 3 cm in depth      Edema legs- left > rt Left TMA  CNS non focal   Labs    Latest Ref Rng & Units 07/10/2023    5:06 AM 07/09/2023    5:00 AM 07/08/2023    4:46 AM  CBC  WBC 4.0 - 10.5 K/uL 8.7  9.0  8.9   Hemoglobin 12.0 - 15.0 g/dL 7.8  7.7  8.5   Hematocrit 36.0 - 46.0 % 23.2  22.6  25.8   Platelets 150 - 400 K/uL 338  323  354        Latest Ref Rng & Units 07/10/2023    5:06 AM 07/09/2023    5:00 AM 07/08/2023    4:46 AM  CMP  Glucose 70 - 99 mg/dL 54  867  859   BUN 8 - 23 mg/dL 16  17  16    Creatinine 0.44 - 1.00 mg/dL 9.36  9.40  9.31   Sodium 135 - 145 mmol/L 137  136  136   Potassium 3.5 - 5.1 mmol/L 3.5  3.5  3.9   Chloride 98 - 111 mmol/L 102  102  100   CO2 22 - 32 mmol/L 27  24  29    Calcium  8.9 - 10.3 mg/dL 7.7  7.9  8.3     Micro Groin abscess- Ecoli  Impression/recommendation PAD s/p left common femoral artery to tibioperoneal trunk artery bypass  with PTFE graft in OCT 2024   Abscess due to Ecoli  at the surgical site- CT showed  10 cm complex collection s/p excisional debridement of the abscess and rotation of a sarorius muscle flap for coverage of existing bypass grafts 06/24/23 Repeat culture from 12/26 is negative   Day 17 of antibiotic Discussed with Dr. Jama .  As vascular graft is exposed will treat like endovascular infection and give IV antibiotic for 4 weeks until 07/22/23 Unasyn  while in hospital and ceftriaxone on discharge Leucocytosis-resolved   Left TMA- healed well  ecoli and enterococcus in  culture in sept when she had TMA   CAD s/p CABG   H/o bladder ca s/p left nephrectomy  Solitary kidney- avoid nephrotoxic drugs   AKI-  resolved   Discussed the management with the patient and her sister ID will not see her this weekend- on call IDP available for urgent issues by phone

## 2023-07-10 NOTE — Inpatient Diabetes Management (Signed)
 Inpatient Diabetes Program Recommendations  AACE/ADA: New Consensus Statement on Inpatient Glycemic Control (2015)  Target Ranges:  Prepandial:   less than 140 mg/dL      Peak postprandial:   less than 180 mg/dL (1-2 hours)      Critically ill patients:  140 - 180 mg/dL   Lab Results  Component Value Date   GLUCAP 75 07/10/2023   HGBA1C 8.0 (H) 01/27/2023    Review of Glycemic Control  Latest Reference Range & Units 07/09/23 08:24 07/09/23 11:29 07/09/23 16:10 07/09/23 20:51 07/10/23 07:46  Glucose-Capillary 70 - 99 mg/dL 889 (H) 887 (H) 856 (H) 127 (H)  Novolog  2 units 75  (H): Data is abnormally high  Diabetes history: DM2 Outpatient Diabetes medications: Tresiba  22 units every day, Humalog  10 units TID, Jardiance  25 mg QD Current orders for Inpatient glycemic control: Semglee  15 units at bedtime, Novolog  0-15 units TID and HS, Novolog  3 units TID  Inpatient Diabetes Program Recommendations:    Please consider discontinuing HS correction.  Just keep Novolog  0-15 TID.    Will continue to follow while inpatient.  Thank you, Wyvonna Pinal, MSN, CDCES Diabetes Coordinator Inpatient Diabetes Program [phone] (team pager from 8a-5p)

## 2023-07-10 NOTE — Plan of Care (Signed)
  Problem: Education: Goal: Knowledge of General Education information will improve Description: Including pain rating scale, medication(s)/side effects and non-pharmacologic comfort measures Outcome: Progressing   Problem: Health Behavior/Discharge Planning: Goal: Ability to manage health-related needs will improve Outcome: Progressing   Problem: Clinical Measurements: Goal: Will remain free from infection Outcome: Progressing   Problem: Activity: Goal: Risk for activity intolerance will decrease Outcome: Progressing   Problem: Elimination: Goal: Will not experience complications related to urinary retention Outcome: Progressing   Problem: Pain Management: Goal: General experience of comfort will improve Outcome: Progressing   Problem: Safety: Goal: Ability to remain free from injury will improve Outcome: Progressing

## 2023-07-10 NOTE — TOC Progression Note (Signed)
 Transition of Care [facility]) - Progression Note    Patient Details  Name: [name] MRN: [id number] Date of Birth: 1961-01-14  Transition of Care [facility]) CM/SW Contact  Royanne JINNY Bernheim, RN Phone Number: 07/10/2023, 12:56 PM  Clinical Narrative:     TOC continues to have difficulty finding a HH agency able to accommodate wound vac changes  IV ABX will continue thru 07/22/23 At this point no [facility] agency will accept he patient       Expected Discharge Plan and Services                                               Social Determinants of Health (SDOH) Interventions SDOH Screenings   Food Insecurity: No Food Insecurity (06/23/2023)  Recent Concern: Food Insecurity - Food Insecurity Present (05/26/2023)   Received from [facility]  Housing: Low Risk  (06/23/2023)  Transportation Needs: No Transportation Needs (06/23/2023)  Utilities: Not At Risk (06/23/2023)  Alcohol Screen: Low Risk  (06/17/2023)  Depression (PHQ2-9): Low Risk  (06/17/2023)  Financial Resource Strain: Low Risk  (06/21/2023)   Received from [facility]  Physical Activity: Inactive (06/17/2023)  Social Connections: Socially Isolated (06/17/2023)  Stress: Stress Concern Present (06/17/2023)  Tobacco Use: Medium Risk (07/09/2023)  Health Literacy: Inadequate Health Literacy (06/17/2023)    Readmission Risk Interventions    06/22/2023   11:01 AM  Readmission Risk Prevention Plan  Transportation Screening Complete  PCP or Specialist Appt within 3-5 Days Complete  Social Work Consult for Recovery Care Planning/Counseling Complete  Palliative Care Screening Not Applicable  Medication Review [employer]) Complete

## 2023-07-10 NOTE — Plan of Care (Signed)

## 2023-07-11 DIAGNOSIS — D72829 Elevated white blood cell count, unspecified: Secondary | ICD-10-CM | POA: Diagnosis not present

## 2023-07-11 DIAGNOSIS — I739 Peripheral vascular disease, unspecified: Secondary | ICD-10-CM | POA: Diagnosis not present

## 2023-07-11 DIAGNOSIS — M79605 Pain in left leg: Secondary | ICD-10-CM | POA: Diagnosis not present

## 2023-07-11 DIAGNOSIS — N179 Acute kidney failure, unspecified: Secondary | ICD-10-CM | POA: Diagnosis not present

## 2023-07-11 LAB — GLUCOSE, CAPILLARY
Glucose-Capillary: 42 mg/dL — CL (ref 70–99)
Glucose-Capillary: 45 mg/dL — ABNORMAL LOW (ref 70–99)
Glucose-Capillary: 95 mg/dL (ref 70–99)
Glucose-Capillary: 101 mg/dL — ABNORMAL HIGH (ref 70–99)
Glucose-Capillary: 104 mg/dL — ABNORMAL HIGH (ref 70–99)
Glucose-Capillary: 123 mg/dL — ABNORMAL HIGH (ref 70–99)
Glucose-Capillary: 135 mg/dL — ABNORMAL HIGH (ref 70–99)

## 2023-07-11 MED ORDER — INSULIN ASPART 100 UNIT/ML IJ SOLN
0.0000 [IU] | Freq: Three times a day (TID) | INTRAMUSCULAR | Status: DC
  Administered 2023-07-11: 1 [IU] via SUBCUTANEOUS
  Administered 2023-07-12: 2 [IU] via SUBCUTANEOUS
  Administered 2023-07-12: 1 [IU] via SUBCUTANEOUS
  Administered 2023-07-12: 2 [IU] via SUBCUTANEOUS
  Administered 2023-07-13: 3 [IU] via SUBCUTANEOUS
  Administered 2023-07-14 – 2023-07-16 (×5): 2 [IU] via SUBCUTANEOUS
  Administered 2023-07-16: 1 [IU] via SUBCUTANEOUS
  Administered 2023-07-16: 3 [IU] via SUBCUTANEOUS
  Administered 2023-07-17: 1 [IU] via SUBCUTANEOUS
  Administered 2023-07-18 – 2023-07-19 (×2): 3 [IU] via SUBCUTANEOUS
  Administered 2023-07-20 (×2): 2 [IU] via SUBCUTANEOUS
  Administered 2023-07-21: 1 [IU] via SUBCUTANEOUS
  Administered 2023-07-21: 3 [IU] via SUBCUTANEOUS
  Administered 2023-07-21: 5 [IU] via SUBCUTANEOUS
  Administered 2023-07-22 – 2023-07-24 (×4): 2 [IU] via SUBCUTANEOUS
  Administered 2023-07-24: 1 [IU] via SUBCUTANEOUS
  Administered 2023-07-24: 2 [IU] via SUBCUTANEOUS
  Filled 2023-07-11 (×22): qty 1

## 2023-07-11 MED ORDER — INSULIN GLARGINE-YFGN 100 UNIT/ML ~~LOC~~ SOLN
10.0000 [IU] | Freq: Every day | SUBCUTANEOUS | Status: DC
  Administered 2023-07-11: 10 [IU] via SUBCUTANEOUS
  Filled 2023-07-11: qty 0.1

## 2023-07-11 NOTE — Plan of Care (Signed)

## 2023-07-11 NOTE — Plan of Care (Signed)

## 2023-07-11 NOTE — Plan of Care (Signed)
 Patient alert and oriented. No distress noted. Wound vac in place, operating well. Will continue to monitor.   Problem: Education: Goal: Ability to describe self-care measures that may prevent or decrease complications (Diabetes Survival Skills Education) will improve Outcome: Progressing Goal: Individualized Educational Video(s) Outcome: Progressing   Problem: Coping: Goal: Ability to adjust to condition or change in health will improve Outcome: Progressing   Problem: Fluid Volume: Goal: Ability to maintain a balanced intake and output will improve Outcome: Progressing   Problem: Health Behavior/Discharge Planning: Goal: Ability to identify and utilize available resources and services will improve Outcome: Progressing Goal: Ability to manage health-related needs will improve Outcome: Progressing   Problem: Metabolic: Goal: Ability to maintain appropriate glucose levels will improve Outcome: Progressing   Problem: Nutritional: Goal: Maintenance of adequate nutrition will improve Outcome: Progressing Goal: Progress toward achieving an optimal weight will improve Outcome: Progressing   Problem: Skin Integrity: Goal: Risk for impaired skin integrity will decrease Outcome: Progressing   Problem: Tissue Perfusion: Goal: Adequacy of tissue perfusion will improve Outcome: Progressing   Problem: Education: Goal: Knowledge of General Education information will improve Description: Including pain rating scale, medication(s)/side effects and non-pharmacologic comfort measures Outcome: Progressing   Problem: Health Behavior/Discharge Planning: Goal: Ability to manage health-related needs will improve Outcome: Progressing   Problem: Clinical Measurements: Goal: Ability to maintain clinical measurements within normal limits will improve Outcome: Progressing Goal: Will remain free from infection Outcome: Progressing Goal: Diagnostic test results will improve Outcome:  Progressing Goal: Respiratory complications will improve Outcome: Progressing Goal: Cardiovascular complication will be avoided Outcome: Progressing   Problem: Activity: Goal: Risk for activity intolerance will decrease Outcome: Progressing   Problem: Nutrition: Goal: Adequate nutrition will be maintained Outcome: Progressing   Problem: Coping: Goal: Level of anxiety will decrease Outcome: Progressing   Problem: Elimination: Goal: Will not experience complications related to bowel motility Outcome: Progressing Goal: Will not experience complications related to urinary retention Outcome: Progressing   Problem: Pain Management: Goal: General experience of comfort will improve Outcome: Progressing   Problem: Safety: Goal: Ability to remain free from injury will improve Outcome: Progressing   Problem: Skin Integrity: Goal: Risk for impaired skin integrity will decrease Outcome: Progressing

## 2023-07-11 NOTE — Progress Notes (Signed)
 Hypoglycemic Event  CBG: 45  Treatment: 8 oz juice/soda  Symptoms: None  Follow-up CBG: Time:08:00 CBG Result:95  Possible Reasons for Event: Inadequate meal intake  Comments/MD notified: Dr Nelson Chimes     Laura Reed

## 2023-07-11 NOTE — Progress Notes (Signed)
 Progress Note   Patient: Laura Reed FMW:[id number] DOB: 1961/04/01 DOA: 06/21/2023     20 DOS: the patient was seen and examined on 07/11/2023      Brief hospital course:  HPI: 63 y.o female with significant PMH of HTN, HLD, DM, sCHF, CAD, PAD (s/p of left common femoral-distal bypass, thrombectomy, angioplasty and stent placement 04/2023 on aspirin , Plavix  and Eliquis ), anxiety, bladder cancer (s/p of nephrectomy). [facility] on 06/21/23 with complaints of right groin pain, redness and oozing post op. Patient has hx of PAD and recently underwent left common femoral-distal bypass, thrombectomy, angioplasty and stent placement 04/2023 and right transmetatarsal amputation. SBP in the 50s and was sent to the ED for further evaluation.  Patient was initially admitted to hospitalist service later transferred to ICU for pressor support.  Patient has undergone multiple trips to the OR for wound debridement and wound VAC exchange.  1/3: Hemodynamically stable.  Going back to the OR on Monday.  Patient will need need wound VAC for many weeks. TOC having issues finding and home health agency.  1/4: Had an episode of hypoglycemia earlier this morning.  Requiring intervention.  Semglee  dose was decreased to 10 from 15 at night, also making sensitive SSI.     Consultants:  [facility]  Infectious disease    Procedures/Surgeries: 06/24/23: Excisional debridement of left groin abscess. Rotation of a sartorius muscle flap for coverage of the existing bypass grafts. Placement of a nondisposable wound VAC dressing left groin. Redo vascular surgery - Drs. [name] and Dew 06/26/23: Excisional debridement left groin wound. Placement of a nondisposable VAC dressing - Dr Jama 06/29/23: Left groin VAC dressing change - Dr Jama   07/02/23: Irrigation and debridement of skin, soft tissue, and muscle for approximately 125 cm to the left groin wound, Pulse lavage irrigation left groin. Left  groin VAC dressing change - Dr Marea 07/10/2023: Going back to the OR for wound VAC change on Monday.       ASSESSMENT & PLAN:   Left groin abscess Sepsis with septic shock resolved Status post I&D with vascular surgery 12/18 with takeback to the OR for left groin debridement, muscle flap, wound VAC exchange 12/20, wound vac exchange 12/23 ID on board and has recommended antibiotics with end date on 07/22/2023 Pain control Patient underwent wound VAC exchange on 07/06/2023 Vascular surgeon planning another wound VAC exchange on Monday   PAD s/p of left common femoral-tibioperoneal trunk bypass, thrombectomy, angioplasty and stent placement 04/2023  on aspirin , Plavix  and Eliquis   Continue eliquis  and aspirin  Continue statin therapy Continue aspirin    Leukocytosis-resolved Continue current antibiotics   Normocytic anemia Expected ABLA d/t surgeries normal hemoglobin prior to vascular procedures.   some blood tinge output in her wound VAC S/p 1 unit of packed red blood cells by vascular team.  Hemoglobin at 7.8 today -Starting on supplement -Continue to monitor -Transfuse below 7  AKI - resolved Likely d/t septic shock resolved Continue to monitor renal function   Hypokalemia D/t Lasix   Replace as needed Monitor BMP   PVC Patient noted to have frequent PVCs on tele This is likely causing her heart rate to appear to be lower than normal.  Will ensure potassium >4, Magnesium >2 Continue to monitor   Hyponatremia  Resolved Monitor sodium level   Chronic diastolic HF Edema bilateral LE Monitor fluid status  Diuresis prn  Monitor BMP w/ diuresis   Hypoalbuminemia  Encourage po intake    HTN  HLD Continue statin  Hold home antihypertensives. Mostly normotensive off of midodrine  Continue to monitor blood pressure   T2DM  A1c 8, had hypoglycemic episode. Decrease Semglee  to 10 units at night Continue mealtime 3 units  daily Switching to sensitive SSI        DVT prophylaxis: ELiquis     Central lines / invasive devices: wound vac    Code Status: FULL CODE   TOC needs: home health  Barriers to dispo / significant pending items: wound vac in place, iv abx, planning wound vac change in OR      Subjective Patient was sitting comfortably in chair when seen today.  No new concern.  Sister at bedside.   Family Communication: Discussed with sister at bedside     Physical Exam General.  Well-developed lady, in no acute distress. Pulmonary.  Lungs clear bilaterally, normal respiratory effort. CV.  Regular rate and rhythm, no JVD, rub or murmur. Abdomen.  Soft, nontender, nondistended, BS positive. CNS.  Alert and oriented .  No focal neurologic deficit. Extremities.  1+ right LE, 2+ left LE and left groin wound VAC Psychiatry.  Judgment and insight appears normal.      Data Reviewed:     Latest Ref Rng & Units 07/10/2023    5:06 AM 07/09/2023    5:00 AM 07/08/2023    4:46 AM  BMP  Glucose 70 - 99 mg/dL 54  867  859   BUN 8 - 23 mg/dL 16  17  16    Creatinine 0.44 - 1.00 mg/dL 9.36  9.40  9.31   Sodium 135 - 145 mmol/L 137  136  136   Potassium 3.5 - 5.1 mmol/L 3.5  3.5  3.9   Chloride 98 - 111 mmol/L 102  102  100   CO2 22 - 32 mmol/L 27  24  29    Calcium  8.9 - 10.3 mg/dL 7.7  7.9  8.3     Vitals:   07/10/23 1610 07/10/23 2251 07/11/23 0737 07/11/23 1517  BP: (!) 128/51 (!) 102/55 (!) 120/53 (!) 141/47  Pulse: 70 71 61 68  Resp: 14 17 12 12   Temp: 98.7 F (37.1 C) 98.8 F (37.1 C) 97.8 F (36.6 C) 98 F (36.7 C)  TempSrc:  Oral Oral   SpO2: 99% 94% 95% 99%  Weight:      Height:          Latest Ref Rng & Units 07/10/2023    5:06 AM 07/09/2023    5:00 AM 07/08/2023    4:46 AM  CBC  WBC 4.0 - 10.5 K/uL 8.7  9.0  8.9   Hemoglobin 12.0 - 15.0 g/dL 7.8  7.7  8.5   Hematocrit 36.0 - 46.0 % 23.2  22.6  25.8   Platelets 150 - 400 K/uL 338  323  354      Author: Amaryllis Dare, MD 07/11/2023 4:01 PM  For on call review  [email or web address].

## 2023-07-12 DIAGNOSIS — I739 Peripheral vascular disease, unspecified: Secondary | ICD-10-CM | POA: Diagnosis not present

## 2023-07-12 DIAGNOSIS — N179 Acute kidney failure, unspecified: Secondary | ICD-10-CM | POA: Diagnosis not present

## 2023-07-12 DIAGNOSIS — M79605 Pain in left leg: Secondary | ICD-10-CM | POA: Diagnosis not present

## 2023-07-12 DIAGNOSIS — D72829 Elevated white blood cell count, unspecified: Secondary | ICD-10-CM | POA: Diagnosis not present

## 2023-07-12 LAB — GLUCOSE, CAPILLARY
Glucose-Capillary: 197 mg/dL — ABNORMAL HIGH (ref 70–99)
Glucose-Capillary: 171 mg/dL — ABNORMAL HIGH (ref 70–99)
Glucose-Capillary: 143 mg/dL — ABNORMAL HIGH (ref 70–99)
Glucose-Capillary: 183 mg/dL — ABNORMAL HIGH (ref 70–99)

## 2023-07-12 MED ORDER — INSULIN GLARGINE-YFGN 100 UNIT/ML ~~LOC~~ SOLN
12.0000 [IU] | Freq: Every day | SUBCUTANEOUS | Status: DC
  Administered 2023-07-12 – 2023-07-15 (×4): 12 [IU] via SUBCUTANEOUS
  Filled 2023-07-12 (×5): qty 0.12

## 2023-07-12 NOTE — Plan of Care (Signed)
  Problem: Coping: Goal: Ability to adjust to condition or change in health will improve Outcome: Progressing   Problem: Education: Goal: Knowledge of General Education information will improve Description: Including pain rating scale, medication(s)/side effects and non-pharmacologic comfort measures Outcome: Progressing   Problem: Clinical Measurements: Goal: Diagnostic test results will improve Outcome: Progressing   Problem: Elimination: Goal: Will not experience complications related to bowel motility Outcome: Progressing

## 2023-07-12 NOTE — Progress Notes (Signed)
 Plan for wound vac change I the OR tomorrow NPO after midnight Consent  Durene Cal

## 2023-07-12 NOTE — Progress Notes (Signed)
 Progress Note   Patient: Laura Reed FMW:[id number] DOB: Sep 01, 1960 DOA: 06/21/2023     21 DOS: the patient was seen and examined on 07/12/2023      Brief hospital course:  HPI: 63 y.o female with significant PMH of HTN, HLD, DM, sCHF, CAD, PAD (s/p of left common femoral-distal bypass, thrombectomy, angioplasty and stent placement 04/2023 on aspirin , Plavix  and Eliquis ), anxiety, bladder cancer (s/p of nephrectomy). [facility] on 06/21/23 with complaints of right groin pain, redness and oozing post op. Patient has hx of PAD and recently underwent left common femoral-distal bypass, thrombectomy, angioplasty and stent placement 04/2023 and right transmetatarsal amputation. SBP in the 50s and was sent to the ED for further evaluation.  Patient was initially admitted to hospitalist service later transferred to ICU for pressor support.  Patient has undergone multiple trips to the OR for wound debridement and wound VAC exchange.  1/3: Hemodynamically stable.  Going back to the OR on Monday.  Patient will need need wound VAC for many weeks. TOC having issues finding and home health agency.  1/4: Had an episode of hypoglycemia earlier this morning.  Requiring intervention.  Semglee  dose was decreased to 10 from 15 at night, also making sensitive SSI.  1/5: CBG elevated this morning so increasing the dose of Semglee  to 12 units.  Going for another wound VAC change with vascular surgery tomorrow, n.p.o. after midnight.     Consultants:  [facility]  Infectious disease    Procedures/Surgeries: 06/24/23: Excisional debridement of left groin abscess. Rotation of a sartorius muscle flap for coverage of the existing bypass grafts. Placement of a nondisposable wound VAC dressing left groin. Redo vascular surgery - Drs. [name] and Dew 06/26/23: Excisional debridement left groin wound. Placement of a nondisposable VAC dressing - Dr Jama 06/29/23: Left groin VAC dressing change - Dr  Jama   07/02/23: Irrigation and debridement of skin, soft tissue, and muscle for approximately 125 cm to the left groin wound, Pulse lavage irrigation left groin. Left groin VAC dressing change - Dr Marea 07/10/2023: Going back to the OR for wound VAC change on Monday.       ASSESSMENT & PLAN:   Left groin abscess Sepsis with septic shock resolved Status post I&D with vascular surgery 12/18 with takeback to the OR for left groin debridement, muscle flap, wound VAC exchange 12/20, wound vac exchange 12/23 ID on board and has recommended antibiotics with end date on 07/22/2023 Pain control Patient underwent wound VAC exchange on 07/06/2023 Vascular surgeon planning another wound VAC exchange on Monday   PAD s/p of left common femoral-tibioperoneal trunk bypass, thrombectomy, angioplasty and stent placement 04/2023  on aspirin , Plavix  and Eliquis   Continue eliquis  and aspirin  Continue statin therapy Continue aspirin    Leukocytosis-resolved Continue current antibiotics   Normocytic anemia Expected ABLA d/t surgeries normal hemoglobin prior to vascular procedures.   some blood tinge output in her wound VAC S/p 1 unit of packed red blood cells by vascular team.  Hemoglobin at 7.8 today -Starting on supplement -Continue to monitor -Transfuse below 7  AKI - resolved Likely d/t septic shock resolved Continue to monitor renal function   Hypokalemia D/t Lasix   Replace as needed Monitor BMP   PVC Patient noted to have frequent PVCs on tele This is likely causing her heart rate to appear to be lower than normal.  Will ensure potassium >4, Magnesium >2 Continue to monitor   Hyponatremia  Resolved Monitor sodium level   Chronic diastolic HF  Edema bilateral LE Monitor fluid status  Diuresis prn  Monitor BMP w/ diuresis   Hypoalbuminemia  Encourage po intake    HTN  HLD Continue statin Hold home antihypertensives. Mostly normotensive off of midodrine  Continue to  monitor blood pressure   T2DM  A1c 8, CBG elevated this morning after decreasing the dose of Semglee  Increase Semglee  to 12 units at night Continue mealtime 3 units  daily Continue with sensitive SSI       DVT prophylaxis: ELiquis     Central lines / invasive devices: wound vac    Code Status: FULL CODE   TOC needs: home health  Barriers to dispo / significant pending items: wound vac in place, iv abx, planning wound vac change in OR      Subjective Patient was seen and examined today.  No new concern.   Family Communication: Discussed with sister at bedside     Physical Exam General.  Well-developed lady, in no acute distress. Pulmonary.  Lungs clear bilaterally, normal respiratory effort. CV.  Regular rate and rhythm, no JVD, rub or murmur. Abdomen.  Soft, nontender, nondistended, BS positive. CNS.  Alert and oriented .  No focal neurologic deficit. Extremities.  Trace edema on R LE, 1+ on left LE, wound VAC on left groin Psychiatry.  Judgment and insight appears normal.       Data Reviewed:     Latest Ref Rng & Units 07/10/2023    5:06 AM 07/09/2023    5:00 AM 07/08/2023    4:46 AM  BMP  Glucose 70 - 99 mg/dL 54  867  859   BUN 8 - 23 mg/dL 16  17  16    Creatinine 0.44 - 1.00 mg/dL 9.36  9.40  9.31   Sodium 135 - 145 mmol/L 137  136  136   Potassium 3.5 - 5.1 mmol/L 3.5  3.5  3.9   Chloride 98 - 111 mmol/L 102  102  100   CO2 22 - 32 mmol/L 27  24  29    Calcium  8.9 - 10.3 mg/dL 7.7  7.9  8.3     Vitals:   07/11/23 2106 07/12/23 0500 07/12/23 1046 07/12/23 1507  BP: (!) 136/48  (!) 149/55 (!) 150/51  Pulse: 67  68 68  Resp: 18  18 18   Temp: 98.4 F (36.9 C)  98.9 F (37.2 C) 98.9 F (37.2 C)  TempSrc: Oral  Oral   SpO2: 93%  96% 96%  Weight:  68.3 kg    Height:          Latest Ref Rng & Units 07/10/2023    5:06 AM 07/09/2023    5:00 AM 07/08/2023    4:46 AM  CBC  WBC 4.0 - 10.5 K/uL 8.7  9.0  8.9   Hemoglobin 12.0 - 15.0 g/dL 7.8  7.7  8.5   Hematocrit  36.0 - 46.0 % 23.2  22.6  25.8   Platelets 150 - 400 K/uL 338  323  354      Author: Amaryllis Dare, MD 07/12/2023 3:18 PM  For on call review [email or web address].

## 2023-07-13 ENCOUNTER — Encounter: Payer: Self-pay | Admitting: Internal Medicine

## 2023-07-13 ENCOUNTER — Encounter
Admission: EM | Disposition: A | Discharge status: Home Health | Payer: Self-pay | Source: Home / Self Care | Attending: Internal Medicine

## 2023-07-13 ENCOUNTER — Other Ambulatory Visit: Payer: Self-pay

## 2023-07-13 ENCOUNTER — Inpatient Hospital Stay: Payer: 59 | Admitting: General Practice

## 2023-07-13 DIAGNOSIS — I739 Peripheral vascular disease, unspecified: Secondary | ICD-10-CM | POA: Diagnosis not present

## 2023-07-13 DIAGNOSIS — D72829 Elevated white blood cell count, unspecified: Secondary | ICD-10-CM | POA: Diagnosis not present

## 2023-07-13 DIAGNOSIS — N179 Acute kidney failure, unspecified: Secondary | ICD-10-CM | POA: Diagnosis not present

## 2023-07-13 DIAGNOSIS — M79605 Pain in left leg: Secondary | ICD-10-CM | POA: Diagnosis not present

## 2023-07-13 HISTORY — PX: APPLICATION OF WOUND VAC: SHX5189

## 2023-07-13 LAB — GLUCOSE, CAPILLARY
Glucose-Capillary: 137 mg/dL — ABNORMAL HIGH (ref 70–99)
Glucose-Capillary: 131 mg/dL — ABNORMAL HIGH (ref 70–99)
Glucose-Capillary: 132 mg/dL — ABNORMAL HIGH (ref 70–99)
Glucose-Capillary: 221 mg/dL — ABNORMAL HIGH (ref 70–99)
Glucose-Capillary: 120 mg/dL — ABNORMAL HIGH (ref 70–99)
Glucose-Capillary: 107 mg/dL — ABNORMAL HIGH (ref 70–99)

## 2023-07-13 SURGERY — APPLICATION, WOUND VAC
Anesthesia: General | Laterality: Left

## 2023-07-13 MED ORDER — MIDAZOLAM HCL 2 MG/2ML IJ SOLN
INTRAMUSCULAR | Status: AC
Start: 2023-07-13 — End: ?
  Filled 2023-07-13: qty 2

## 2023-07-13 MED ORDER — FLUCONAZOLE 100 MG PO TABS
100.0000 mg | ORAL_TABLET | Freq: Every day | ORAL | Status: DC
Start: 2023-07-13 — End: 2023-07-13
  Filled 2023-07-13: qty 1

## 2023-07-13 MED ORDER — PROPOFOL 10 MG/ML IV BOLUS
INTRAVENOUS | Status: DC | PRN
  Administered 2023-07-13: 10 mg via INTRAVENOUS
  Administered 2023-07-13: 30 mg via INTRAVENOUS

## 2023-07-13 MED ORDER — OXYCODONE HCL 5 MG/5ML PO SOLN
5.0000 mg | Freq: Once | ORAL | Status: DC | PRN
Start: 2023-07-13 — End: 2023-07-13

## 2023-07-13 MED ORDER — ONDANSETRON HCL 4 MG/2ML IJ SOLN
INTRAMUSCULAR | Status: DC | PRN
  Administered 2023-07-13: 4 mg via INTRAVENOUS

## 2023-07-13 MED ORDER — PROPOFOL 500 MG/50ML IV EMUL
INTRAVENOUS | Status: DC | PRN
  Administered 2023-07-13: 75 ug/kg/min via INTRAVENOUS

## 2023-07-13 MED ORDER — PHENYLEPHRINE 80 MCG/ML (10ML) SYRINGE FOR IV PUSH (FOR BLOOD PRESSURE SUPPORT)
PREFILLED_SYRINGE | INTRAVENOUS | Status: DC | PRN
  Administered 2023-07-13: 80 ug via INTRAVENOUS

## 2023-07-13 MED ORDER — FLUCONAZOLE IN SODIUM CHLORIDE 400-0.9 MG/200ML-% IV SOLN
400.0000 mg | Freq: Every day | INTRAVENOUS | Status: DC
  Filled 2023-07-13 (×2): qty 200

## 2023-07-13 MED ORDER — GLYCOPYRROLATE 0.2 MG/ML IJ SOLN
INTRAMUSCULAR | Status: DC | PRN
  Administered 2023-07-13: .2 mg via INTRAVENOUS

## 2023-07-13 MED ORDER — SODIUM CHLORIDE 0.9 % IV SOLN: INTRAVENOUS | Status: DC | PRN

## 2023-07-13 MED ORDER — PROPOFOL 1000 MG/100ML IV EMUL
INTRAVENOUS | Status: AC
  Filled 2023-07-13: qty 100

## 2023-07-13 MED ORDER — FENTANYL CITRATE (PF) 100 MCG/2ML IJ SOLN
INTRAMUSCULAR | Status: DC | PRN
  Administered 2023-07-13 (×2): 25 ug via INTRAVENOUS

## 2023-07-13 MED ORDER — FENTANYL CITRATE (PF) 100 MCG/2ML IJ SOLN: 25.0000 ug | INTRAMUSCULAR | Status: DC | PRN

## 2023-07-13 MED ORDER — FENTANYL CITRATE (PF) 100 MCG/2ML IJ SOLN
INTRAMUSCULAR | Status: AC
  Filled 2023-07-13: qty 2

## 2023-07-13 MED ORDER — MIDAZOLAM HCL 2 MG/2ML IJ SOLN
INTRAMUSCULAR | Status: DC | PRN
  Administered 2023-07-13 (×2): 1 mg via INTRAVENOUS

## 2023-07-13 MED ORDER — FLUCONAZOLE IN SODIUM CHLORIDE 200-0.9 MG/100ML-% IV SOLN: 200.0000 mg | Freq: Every day | INTRAVENOUS | Status: DC

## 2023-07-13 MED ORDER — FLUCONAZOLE 100 MG PO TABS
200.0000 mg | ORAL_TABLET | Freq: Every day | ORAL | Status: AC
  Administered 2023-07-13 – 2023-07-15 (×3): 200 mg via ORAL
  Filled 2023-07-13 (×3): qty 2

## 2023-07-13 MED ORDER — OXYCODONE HCL 5 MG PO TABS: 5.0000 mg | ORAL_TABLET | Freq: Once | ORAL | Status: DC | PRN

## 2023-07-13 MED ORDER — EPHEDRINE SULFATE-NACL 50-0.9 MG/10ML-% IV SOSY
PREFILLED_SYRINGE | INTRAVENOUS | Status: DC | PRN
  Administered 2023-07-13: 5 mg via INTRAVENOUS

## 2023-07-13 SURGICAL SUPPLY — 31 items
BNDG COHESIVE 6X5 TAN ST LF (GAUZE/BANDAGES/DRESSINGS) ×1 IMPLANT
CANISTER WOUND CARE 500ML ATS (WOUND CARE) IMPLANT
CHLORAPREP W/TINT 26 (MISCELLANEOUS) ×1 IMPLANT
DRAPE EXTREMITY 106X87X128.5 (DRAPES) IMPLANT
DRAPE INCISE IOBAN 66X45 STRL (DRAPES) IMPLANT
DRSG EMULSION OIL 3X3 NADH (GAUZE/BANDAGES/DRESSINGS) IMPLANT
DRSG VAC GRANUFOAM LG (GAUZE/BANDAGES/DRESSINGS) IMPLANT
DRSG VAC GRANUFOAM MED (GAUZE/BANDAGES/DRESSINGS) IMPLANT
ELECT REM PT RETURN 9FT ADLT (ELECTROSURGICAL) ×1 IMPLANT
ELECTRODE REM PT RTRN 9FT ADLT (ELECTROSURGICAL) ×1 IMPLANT
GAUZE SPONGE 4X4 12PLY STRL (GAUZE/BANDAGES/DRESSINGS) IMPLANT
GAUZE STRETCH 2X75IN STRL (MISCELLANEOUS) IMPLANT
GLOVE BIO SURGEON STRL SZ7 (GLOVE) ×1 IMPLANT
GLOVE SURG SYN 8.0 (GLOVE) ×1 IMPLANT
GLOVE SURG SYN 8.0 PF PI (GLOVE) ×1 IMPLANT
GOWN STRL REUS W/ TWL LRG LVL3 (GOWN DISPOSABLE) ×2 IMPLANT
GOWN STRL REUS W/ TWL XL LVL3 (GOWN DISPOSABLE) ×1 IMPLANT
GRAFT SKIN WND MESHED 7X10 (Tissue) IMPLANT
GRAFT SKIN WND SURGICLOSE M95 (Tissue) IMPLANT
KIT TURNOVER KIT A (KITS) ×1 IMPLANT
LABEL OR SOLS (LABEL) ×1 IMPLANT
MANIFOLD NEPTUNE II (INSTRUMENTS) ×1 IMPLANT
NS IRRIG 500ML POUR BTL (IV SOLUTION) ×1 IMPLANT
PACK EXTREMITY ARMC (MISCELLANEOUS) ×1 IMPLANT
PAD PREP OB/GYN DISP 24X41 (PERSONAL CARE ITEMS) ×1 IMPLANT
SET YANKAUER POOLE SUCT (MISCELLANEOUS) IMPLANT
SOL PREP PVP 2OZ (MISCELLANEOUS) ×1 IMPLANT
SOLUTION PREP PVP 2OZ (MISCELLANEOUS) ×1 IMPLANT
STOCKINETTE IMPERV 14X48 (MISCELLANEOUS) ×1 IMPLANT
TRAP FLUID SMOKE EVACUATOR (MISCELLANEOUS) ×1 IMPLANT
WATER STERILE IRR 500ML POUR (IV SOLUTION) ×1 IMPLANT

## 2023-07-13 NOTE — Transfer of Care (Signed)
 Immediate Anesthesia Transfer of Care Note  Patient: Laura Reed  Procedure(s) Performed: WOUND VAC EXCHANGE LEFT GROIN (Left)  Patient Location: PACU  Anesthesia Type:General  Level of Consciousness: awake, drowsy, and patient cooperative  Airway & Oxygen Therapy: Patient Spontanous Breathing and Patient connected to face mask oxygen  Post-op Assessment: Report given to RN and Post -op Vital signs reviewed and stable  Post vital signs: Reviewed and stable  Last Vitals:  Vitals Value Taken Time  BP 125/61 07/13/23 0819  Temp 36.8 C 07/13/23 0819  Pulse 81 07/13/23 0821  Resp 14 07/13/23 0821  SpO2 100 % 07/13/23 0821  Vitals shown include unfiled device data.  Last Pain:  Vitals:   07/13/23 0717  TempSrc:   PainSc: 1       Patients Stated Pain Goal: 0 (07/09/23 2130)  Complications: No notable events documented.

## 2023-07-13 NOTE — Plan of Care (Signed)

## 2023-07-13 NOTE — Plan of Care (Signed)
 Patient ID: SMT. LODER, female   DOB: Feb 11, 1961, 63 y.o.   MRN: [id number]  Problem: Education: Goal: Ability to describe self-care measures that may prevent or decrease complications (Diabetes Survival Skills Education) will improve Outcome: Progressing Goal: Individualized Educational Video(s) Outcome: Progressing   Problem: Coping: Goal: Ability to adjust to condition or change in health will improve Outcome: Progressing   Problem: Fluid Volume: Goal: Ability to maintain a balanced intake and output will improve Outcome: Progressing   Problem: Health Behavior/Discharge Planning: Goal: Ability to identify and utilize available resources and services will improve Outcome: Progressing Goal: Ability to manage health-related needs will improve Outcome: Progressing   Problem: Metabolic: Goal: Ability to maintain appropriate glucose levels will improve Outcome: Progressing   Problem: Nutritional: Goal: Maintenance of adequate nutrition will improve Outcome: Progressing Goal: Progress toward achieving an optimal weight will improve Outcome: Progressing   Problem: Skin Integrity: Goal: Risk for impaired skin integrity will decrease Outcome: Progressing   Problem: Tissue Perfusion: Goal: Adequacy of tissue perfusion will improve Outcome: Progressing   Problem: Education: Goal: Knowledge of General Education information will improve Description: Including pain rating scale, medication(s)/side effects and non-pharmacologic comfort measures Outcome: Progressing   Problem: Health Behavior/Discharge Planning: Goal: Ability to manage health-related needs will improve Outcome: Progressing   Problem: Clinical Measurements: Goal: Ability to maintain clinical measurements within normal limits will improve Outcome: Progressing Goal: Will remain free from infection Outcome: Progressing Goal: Diagnostic test results will improve Outcome: Progressing Goal: Respiratory  complications will improve Outcome: Progressing Goal: Cardiovascular complication will be avoided Outcome: Progressing   Problem: Activity: Goal: Risk for activity intolerance will decrease Outcome: Progressing   Problem: Nutrition: Goal: Adequate nutrition will be maintained Outcome: Progressing   Problem: Coping: Goal: Level of anxiety will decrease Outcome: Progressing   Problem: Elimination: Goal: Will not experience complications related to bowel motility Outcome: Progressing Goal: Will not experience complications related to urinary retention Outcome: Progressing   Problem: Pain Management: Goal: General experience of comfort will improve Outcome: Progressing   Problem: Safety: Goal: Ability to remain free from injury will improve Outcome: Progressing   Problem: Skin Integrity: Goal: Risk for impaired skin integrity will decrease Outcome: Progressing    Verdie JONETTA Collier, RN

## 2023-07-13 NOTE — Op Note (Signed)
    OPERATIVE NOTE   PROCEDURE: Excisional debridement left groin wound. Application of Kerecis graft left groin wound. Application of a VAC dressing  PRE-OPERATIVE DIAGNOSIS: Left groin wound status post drainage of abscess  POST-OPERATIVE DIAGNOSIS: Same  SURGEON: Cordella Kahari Critzer  ASSISTANT(S): Gwendlyn Shank, NP  ANESTHESIA: MAC  ESTIMATED BLOOD LOSS: 5 cc  FINDING(S): The wound appears to be contracting and granulating nicely.  The Kerecis seems to be having a positive effect.  The femorofemoral graft remains covered.  The femoral distal graft remains exposed in the medial aspect.  Additional finding is what appears to be a yeast infection in the skin surrounding the wound particularly superiorly and medially  SPECIMEN(S): Debrided tissue was not sent  INDICATIONS:   Laura Reed is a 63 y.o. female who presents with left groin wound status post drainage of an abscess.  As a consequence her graft has been exposed and we initially rotated a sartorius muscle flap for coverage.  We are now doing VAC changes with debridement and application of Kerecis twice a week..  DESCRIPTION: After full informed written consent was obtained from the patient, the patient was brought back to the operating room and placed supine upon the operating table.  Prior to induction, the patient received IV antibiotics.   After obtaining adequate anesthesia, the patient was then prepped and draped in the standard fashion for a debridement of her left groin wound with application of Kerecis and a wound VAC.  The wound VAC dressing including the sponge is removed.  This exposes what appears to be a severe yeast infection of the skin surrounding the wound.  This is most prominent superiorly as well as medially in the groin crease between the thigh and the labia.  The Adaptic is then removed revealing what appears to be a healthy sartorius muscle flap.  The wound is then prepped including the skin with  Betadine .  The wound was then inspected and debrided using DeBakey forceps and Mayo scissors.  Patches of devitalized tissue were removed sharply.  The wound was then measured and is 14 cm in length by 4 cm in width by 3 cm in depth.  This equals 56 cm.  The graft remains exposed in the medial aspect.  Micronized Kerecis is then mixed with saline and applied in this deepest part of the wound.  A fenestrated sheet of Kerecis is then applied over the top followed by an 8 Adaptic and then a wound VAC using a black sponge.  Good seal is obtained in the operating room.   The patient tolerated this procedure well.   COMPLICATIONS: None  CONDITION: Metta Cordella Shawl [facility]  Office: [phone]   07/13/2023, 8:16 AM

## 2023-07-13 NOTE — Interval H&P Note (Signed)
 History and Physical Interval Note:  07/13/2023 7:17 AM  Laura Reed  has presented today for surgery, with the diagnosis of left groin infection.  The various methods of treatment have been discussed with the patient and family. After consideration of risks, benefits and other options for treatment, the patient has consented to  Procedure(s): WOUND VAC EXCHANGE LEFT GROIN (Left) as a surgical intervention.  The patient's history has been reviewed, patient examined, no change in status, stable for surgery.  I have reviewed the patient's chart and labs.  Questions were answered to the patient's satisfaction.     Cordella Shawl

## 2023-07-13 NOTE — Anesthesia Procedure Notes (Signed)
 Procedure Name: General with mask airway Date/Time: 07/13/2023 7:42 AM  Performed by: Ledora Duncan, CRNAPre-anesthesia Checklist: Patient identified, Emergency Drugs available, Suction available and Patient being monitored Patient Re-evaluated:Patient Re-evaluated prior to induction Oxygen Delivery Method: Simple face mask Induction Type: IV induction Placement Confirmation: positive ETCO2, CO2 detector and breath sounds checked- equal and bilateral Dental Injury: Teeth and Oropharynx as per pre-operative assessment

## 2023-07-13 NOTE — Progress Notes (Signed)
 Physical Therapy Treatment Patient Details Name: Laura Reed MRN: [id number] DOB: Mar 24, 1961 Today's Date: 07/13/2023   History of Present Illness ISYS TIETJE  has presented today for surgery, with the diagnosis of Groin Infection. S/P I & D left groin. On April 29, 2023 she underwent a femoral to tibioperoneal trunk bypass using a PTFE distal flow graft.  She was noted to thrombose within the first 48 hours and subsequently underwent angiography with salvage of her bypass and stenting of the anterior tibial artery.  Following the second procedure which was May 01, 2023 she did well and was discharged to home approximately 10 days later. Wound vac change 12/23.  S/p 07/06/23 wound VAC washout and change.  S/p 07/09/23 I&D of skin, soft tissue, and muscle (for approximately 75 cm2) to L groin; application of Kerecis biologic to wound to L groin; and placement of nondisposable negative pressure dressing.    PT Comments  Pt is making good progress towards goals. Trialed out 3 prong vs 4 prong cane. Pt feels better with QC, however ultimately is safest with 2 hand support of RW. Gait training performed in hallway with majority of distance performed using RW. Will continue to progress as able.   If plan is discharge home, recommend the following: A little help with walking and/or transfers;A little help with bathing/dressing/bathroom;Assistance with cooking/housework;Help with stairs or ramp for entrance;Assist for transportation   Can travel by private vehicle        Equipment Recommendations  Rolling walker (2 wheels)    Recommendations for Other Services       Precautions / Restrictions Precautions Precautions: Fall Precaution Comments: wound vac Restrictions Weight Bearing Restrictions Per Provider Order: No     Mobility  Bed Mobility Overal bed mobility: Independent Bed Mobility: Supine to Sit           General bed mobility comments: safe technique     Transfers Overall transfer level: Modified independent Equipment used:  (3 prong cane) Transfers: Sit to/from Stand Sit to Stand: Modified independent (Device/Increase time)           General transfer comment: safe technique. Pt elected not to wear shoes, but ambulate in grip socks    Ambulation/Gait Ambulation/Gait assistance: Contact guard assist, Modified independent (Device/Increase time) Gait Distance (Feet): 200 Feet Assistive device: Rolling walker (2 wheels) Gait Pattern/deviations: Step-through pattern       General Gait Details: initially ambulated with 2 prong cane for 40'. Pt felt unstable and requested to use RW for the rest of the hallway ambulation. Cues for reciprocal gait pattern and looking forward as she tends to look at floor. Pt then ambulated an additional 10' using QC.   Stairs             Wheelchair Mobility     Tilt Bed    Modified Rankin (Stroke Patients Only)       Balance Overall balance assessment: Modified Independent                                          Cognition Arousal: Alert Behavior During Therapy: WFL for tasks assessed/performed Overall Cognitive Status: Within Functional Limits for tasks assessed                                 General Comments: Pt is  A and O x 4. Cooperative and pleasant        Exercises      General Comments        Pertinent Vitals/Pain Pain Assessment Pain Assessment: No/denies pain    Home Living                          Prior Function            PT Goals (current goals can now be found in the care plan section) Acute Rehab PT Goals Patient Stated Goal: to return home PT Goal Formulation: With patient Time For Goal Achievement: 07/24/23 Potential to Achieve Goals: Good Progress towards PT goals: Progressing toward goals    Frequency    Min 1X/week      PT Plan      Co-evaluation              AM-PAC PT 6  Clicks Mobility   Outcome Measure  Help needed turning from your back to your side while in a flat bed without using bedrails?: None Help needed moving from lying on your back to sitting on the side of a flat bed without using bedrails?: None Help needed moving to and from a bed to a chair (including a wheelchair)?: None Help needed standing up from a chair using your arms (e.g., wheelchair or bedside chair)?: None Help needed to walk in hospital room?: A Little Help needed climbing 3-5 steps with a railing? : A Little 6 Click Score: 22    End of Session Equipment Utilized During Treatment: Gait belt Activity Tolerance: Patient tolerated treatment well Patient left:  (sitting at EOB) Nurse Communication: Mobility status;Precautions PT Visit Diagnosis: Other abnormalities of gait and mobility (R26.89);Unsteadiness on feet (R26.81);Pain;Difficulty in walking, not elsewhere classified (R26.2);Muscle weakness (generalized) (M62.81) Pain - Right/Left: Left Pain - part of body: Hip     Time: 8944-8883 PT Time Calculation (min) (ACUTE ONLY): 21 min  Charges:    $Gait Training: 8-22 mins PT General Charges $$ ACUTE PT VISIT: 1 Visit                     Corean Dade, PT, DPT, G[facility] [phone]    Bell Cai 07/13/2023, 1:28 PM

## 2023-07-13 NOTE — Progress Notes (Signed)
 Progress Note   Patient: Laura Reed FMW:[id number] DOB: 03/16/1961 DOA: 06/21/2023     22 DOS: the patient was seen and examined on 07/13/2023      Brief hospital course:  HPI: 63 y.o female with significant PMH of HTN, HLD, DM, sCHF, CAD, PAD (s/p of left common femoral-distal bypass, thrombectomy, angioplasty and stent placement 04/2023 on aspirin , Plavix  and Eliquis ), anxiety, bladder cancer (s/p of nephrectomy). [facility] on 06/21/23 with complaints of right groin pain, redness and oozing post op. Patient has hx of PAD and recently underwent left common femoral-distal bypass, thrombectomy, angioplasty and stent placement 04/2023 and right transmetatarsal amputation. SBP in the 50s and was sent to the ED for further evaluation.  Patient was initially admitted to hospitalist service later transferred to ICU for pressor support.  Patient has undergone multiple trips to the OR for wound debridement and wound VAC exchange.  1/3: Hemodynamically stable.  Going back to the OR on Monday.  Patient will need need wound VAC for many weeks. TOC having issues finding and home health agency.  1/4: Had an episode of hypoglycemia earlier this morning.  Requiring intervention.  Semglee  dose was decreased to 10 from 15 at night, also making sensitive SSI.  1/5: CBG elevated this morning so increasing the dose of Semglee  to 12 units.  Going for another wound VAC change with vascular surgery tomorrow, n.p.o. after midnight.  1/6: Patient had another wound VAC exchange and further debridement in OR with vascular surgery today.     Consultants:  [facility] Vascular Surgery  Infectious disease    Procedures/Surgeries: 06/24/23: Excisional debridement of left groin abscess. Rotation of a sartorius muscle flap for coverage of the existing bypass grafts. Placement of a nondisposable wound VAC dressing left groin. Redo vascular surgery - Drs. [name] and Dew 06/26/23: Excisional debridement left groin  wound. Placement of a nondisposable VAC dressing - Dr Jama 06/29/23: Left groin VAC dressing change - Dr Jama   07/02/23: Irrigation and debridement of skin, soft tissue, and muscle for approximately 125 cm to the left groin wound, Pulse lavage irrigation left groin. Left groin VAC dressing change - Dr Marea 07/10/2023: Going back to the OR for wound VAC change on Monday.       ASSESSMENT & PLAN:   Left groin abscess Sepsis with septic shock resolved Status post I&D with vascular surgery 12/18 with takeback to the OR for left groin debridement, muscle flap, wound VAC exchange 12/20, wound vac exchange 12/23 ID on board and has recommended antibiotics with end date on 07/22/2023 Pain control Patient underwent wound VAC exchange on 07/06/2023 followed by 07/12/2024 Likely will get another 1 next week Follow vascular surgery recommendations   PAD s/p of left common femoral-tibioperoneal trunk bypass, thrombectomy, angioplasty and stent placement 04/2023  on aspirin , Plavix  and Eliquis   Continue eliquis  and aspirin  Continue statin therapy Continue aspirin    Leukocytosis-resolved Continue current antibiotics   Normocytic anemia Expected ABLA d/t surgeries normal hemoglobin prior to vascular procedures.   some blood tinge output in her wound VAC S/p 1 unit of packed red blood cells by vascular team.  Hemoglobin at 7.8 today -Starting on supplement -Continue to monitor -Transfuse below 7  AKI - resolved Likely d/t septic shock resolved Continue to monitor renal function   Hypokalemia D/t Lasix   Replace as needed Monitor BMP   PVC Patient noted to have frequent PVCs on tele This is likely causing her heart rate to appear to be lower than normal.  Will ensure potassium >4, Magnesium >2 Continue to monitor   Hyponatremia  Resolved Monitor sodium level   Chronic diastolic HF Edema bilateral LE Monitor fluid status  Diuresis prn  Monitor BMP w/ diuresis    Hypoalbuminemia  Encourage po intake    HTN  HLD Continue statin Hold home antihypertensives. Mostly normotensive off of midodrine  Continue to monitor blood pressure   T2DM  A1c 8, CBG elevated this morning after decreasing the dose of Semglee  Increase Semglee  to 12 units at night Continue mealtime 3 units  daily Continue with sensitive SSI       DVT prophylaxis: ELiquis     Central lines / invasive devices: wound vac    Code Status: FULL CODE   TOC needs: home health  Barriers to dispo / significant pending items: wound vac in place, iv abx, planning wound vac change in OR      Subjective Patient was seen after the procedure today.  Denies any pain.  Appears little somnolent due to anesthesia.   Family Communication:      Physical Exam General.  Well-developed lady, in no acute distress. Pulmonary.  Lungs clear bilaterally, normal respiratory effort. CV.  Regular rate and rhythm, no JVD, rub or murmur. Abdomen.  Soft, nontender, nondistended, BS positive. CNS.  Alert and oriented .  No focal neurologic deficit. Extremities.  Trace RLE edema, 1+ LLE edema, wound VAC on left groin Psychiatry.  Judgment and insight appears normal.     Data Reviewed:     Latest Ref Rng & Units 07/10/2023    5:06 AM 07/09/2023    5:00 AM 07/08/2023    4:46 AM  BMP  Glucose 70 - 99 mg/dL 54  867  859   BUN 8 - 23 mg/dL 16  17  16    Creatinine 0.44 - 1.00 mg/dL 9.36  9.40  9.31   Sodium 135 - 145 mmol/L 137  136  136   Potassium 3.5 - 5.1 mmol/L 3.5  3.5  3.9   Chloride 98 - 111 mmol/L 102  102  100   CO2 22 - 32 mmol/L 27  24  29    Calcium  8.9 - 10.3 mg/dL 7.7  7.9  8.3     Vitals:   07/13/23 0830 07/13/23 0845 07/13/23 0900 07/13/23 0937  BP: (!) 143/56 (!) 130/48 134/78 (!) 143/54  Pulse: 77 76 72 72  Resp: 11 10 10 17   Temp:  98.8 F (37.1 C)  97.7 F (36.5 C)  TempSrc:    Oral  SpO2: 100% 92% 97% 97%  Weight:      Height:          Latest Ref Rng & Units 07/10/2023     5:06 AM 07/09/2023    5:00 AM 07/08/2023    4:46 AM  CBC  WBC 4.0 - 10.5 K/uL 8.7  9.0  8.9   Hemoglobin 12.0 - 15.0 g/dL 7.8  7.7  8.5   Hematocrit 36.0 - 46.0 % 23.2  22.6  25.8   Platelets 150 - 400 K/uL 338  323  354      Author: Amaryllis Dare, MD 07/13/2023 3:16 PM  For on call review [email or web address].

## 2023-07-13 NOTE — TOC Progression Note (Signed)
 Transition of Care [facility]) - Progression Note    Patient Details  Name: Laura Reed MRN: [id number] Date of Birth: Dec 07, 1960  Transition of [facility]) CM/SW Contact  Lauraine JAYSON Carpen, LCSW Phone Number: 07/13/2023, 10:19 AM  Clinical Narrative:  TOC continues to follow for discharge needs.    Barriers to Discharge: No Home Care Agency will accept this patient  Expected Discharge Plan and Services                                               Social Determinants of Health (SDOH) Interventions SDOH Screenings   Food Insecurity: No Food Insecurity (06/23/2023)  Recent Concern: Food Insecurity - Food Insecurity Present (05/26/2023)   Received from [facility]  Housing: Low Risk  (06/23/2023)  Transportation Needs: No Transportation Needs (06/23/2023)  Utilities: Not At Risk (06/23/2023)  Alcohol Screen: Low Risk  (06/17/2023)  Depression (PHQ2-9): Low Risk  (06/17/2023)  Financial Resource Strain: Low Risk  (06/21/2023)   Received from [facility]  Physical Activity: Inactive (06/17/2023)  Social Connections: Socially Isolated (06/17/2023)  Stress: Stress Concern Present (06/17/2023)  Tobacco Use: Medium Risk (07/13/2023)  Health Literacy: Inadequate Health Literacy (06/17/2023)    Readmission Risk Interventions    06/22/2023   11:01 AM  Readmission Risk Prevention Plan  Transportation Screening Complete  PCP or Specialist Appt within 3-5 Days Complete  Social Work Consult for Recovery Care Planning/Counseling Complete  Palliative Care Screening Not Applicable  Medication Review Oceanographer) Complete

## 2023-07-13 NOTE — Anesthesia Postprocedure Evaluation (Signed)
 Anesthesia Post Note  Patient: Laura Reed  Procedure(s) Performed: WOUND VAC EXCHANGE LEFT GROIN (Left)  Patient location during evaluation: PACU Anesthesia Type: General Level of consciousness: awake and alert Pain management: pain level controlled Vital Signs Assessment: post-procedure vital signs reviewed and stable Respiratory status: spontaneous breathing, nonlabored ventilation, respiratory function stable and patient connected to nasal cannula oxygen Cardiovascular status: blood pressure returned to baseline and stable Postop Assessment: no apparent nausea or vomiting Anesthetic complications: no  No notable events documented.   Last Vitals:  Vitals:   07/13/23 0845 07/13/23 0900  BP: (!) 130/48 134/78  Pulse: 76 72  Resp: 10 10  Temp: 37.1 C   SpO2: 92% 97%    Last Pain:  Vitals:   07/13/23 0900  TempSrc:   PainSc: 0-No pain                 Debby Mines

## 2023-07-13 NOTE — Plan of Care (Signed)
  Problem: Coping: Goal: Ability to adjust to condition or change in health will improve Outcome: Progressing   Problem: Fluid Volume: Goal: Ability to maintain a balanced intake and output will improve Outcome: Progressing   Problem: Health Behavior/Discharge Planning: Goal: Ability to manage health-related needs will improve Outcome: Progressing   Problem: Metabolic: Goal: Ability to maintain appropriate glucose levels will improve Outcome: Progressing   Problem: Nutritional: Goal: Maintenance of adequate nutrition will improve Outcome: Progressing   Problem: Education: Goal: Knowledge of General Education information will improve Description: Including pain rating scale, medication(s)/side effects and non-pharmacologic comfort measures Outcome: Progressing   

## 2023-07-13 NOTE — Progress Notes (Signed)
 OT Cancellation Note  Patient Details Name: Laura Reed MRN: [id number] DOB: Apr 27, 1961   Cancelled Treatment:    Reason Eval/Treat Not Completed: Patient at procedure or test/ unavailable. Chart reviewed. Pt off unit for procedure, will re-assess pt as available.   Elston Slot, M.S. OTR/L  07/13/23, 8:10 AM  ascom [phone]

## 2023-07-13 NOTE — Anesthesia Preprocedure Evaluation (Signed)
 Anesthesia Evaluation  Patient identified by MRN, date of birth, ID band Patient awake    Reviewed: Allergy & Precautions, NPO status , Patient's Chart, lab work & pertinent test results  History of Anesthesia Complications Negative for: history of anesthetic complications  Airway Mallampati: III  TM Distance: >3 FB Neck ROM: full    Dental no notable dental hx. (+) Edentulous Upper, Edentulous Lower   Pulmonary neg COPD, neg recent URI, former smoker   Pulmonary exam normal breath sounds clear to auscultation       Cardiovascular hypertension, On Medications (-) angina + CAD, + CABG, + Peripheral Vascular Disease and +CHF  (-) Cardiac Stents + dysrhythmias (rate controlled) Atrial Fibrillation + Valvular Problems/Murmurs  Rhythm:Regular Rate:Normal - Systolic murmurs    Neuro/Psych neg Seizures PSYCHIATRIC DISORDERS Anxiety      Neuromuscular disease    GI/Hepatic Neg liver ROS,GERD  Controlled,,  Endo/Other  diabetes, Insulin  Dependent    Renal/GU      Musculoskeletal   Abdominal   Peds  Hematology negative hematology ROS (+)   Anesthesia Other Findings Past Medical History: No date: Absence of kidney     Comment:  left No date: Anxiety No date: Arthritis No date: Bladder cancer (HCC) No date: CHF (congestive heart failure) (HCC) No date: Complication of anesthesia     Comment:  BP HAS  RUN LOW AFTER SURGERY-LUNGS FILLED UP WITH FLUID              AFTER  LEG STENT SURGERY  No date: Coronary artery disease No date: Diabetes mellitus No date: Family history of adverse reaction to anesthesia     Comment:  Sister - PONV No date: GERD (gastroesophageal reflux disease)     Comment:  OCC TUMS No date: Heart murmur No date: Hemorrhoid 2007: History of methicillin resistant staphylococcus aureus (MRSA) No date: Hypertension No date: Neuropathy No date: PVD (peripheral vascular disease) (HCC) No date: Thyroid   nodule     Comment:  right 10/31/2014: Urothelial carcinoma of kidney (HCC)     Comment:  INVASIVE UROTHELIAL CARCINOMA, LOW GRADE. T1, Nx. No date: Wears dentures     Comment:  full upper and lower  Past Surgical History: No date: AMPUTATION TOE     Comment:  right (4th and 5th); left (great toe, 3rd) 07/16/2018: AMPUTATION TOE; Right     Comment:  Procedure: AMPUTATION TOE/MPJ right 2nd;  Surgeon:               Neill Boas, DPM;  Location: [facility];  Service:               Podiatry;  Laterality: Right; 2009, 2013 x 2: ARTERIAL BYPASS SURGRY     Comment:  right leg , done in [place] No date: CARDIAC CATHETERIZATION 01/2014: CAROTID ENDARTERECTOMY; Right     Comment:  Dr Jama 12/14/2014: CATARACT EXTRACTION W/PHACO; Right     Comment:  Procedure: CATARACT EXTRACTION PHACO AND INTRAOCULAR               LENS PLACEMENT (IOC);  Surgeon: Newell Ovens, MD;                Location: [facility];  Service: Ophthalmology;  Laterality:              Right;  US    00:38.6              AP        7.1  CDE  2.76 12/06/2019: CATARACT EXTRACTION W/PHACO; Left     Comment:  Procedure: CATARACT EXTRACTION PHACO AND INTRAOCULAR               LENS PLACEMENT (IOC) LEFT DIABETIC;  Surgeon: Jaye Fallow, MD;  Location: [facility];  Service:               Ophthalmology;  Laterality: Left;  9.08 1:06.4 No date: CESAREAN SECTION 03-03-12: CHOLECYSTECTOMY     Comment:  Porcelain gallbladder, gallstones,  [place] 04/28/2012: COLONOSCOPY W/ BIOPSIES     Comment:  Hyperplastic rectal polyps. 04/02/2022: COLONOSCOPY WITH PROPOFOL ; N/A     Comment:  Procedure: COLONOSCOPY WITH PROPOFOL ;  Surgeon: Dessa Reyes ORN, MD;  Location: [facility];  Service:               Endoscopy;  Laterality: N/A; 2009: CORONARY ARTERY BYPASS GRAFT     Comment:  3 vessel 09/01/2016: CYSTOSCOPY W/ RETROGRADES; Right     Comment:  Procedure: CYSTOSCOPY WITH RETROGRADE  PYELOGRAM;                Surgeon: Rosina Riis, MD;  Location: [facility];                Service: Urology;  Laterality: Right; 03/19/2020: CYSTOSCOPY W/ RETROGRADES; Bilateral     Comment:  Procedure: CYSTOSCOPY WITH RETROGRADE PYELOGRAM;                Surgeon: Riis Rosina, MD;  Location: [facility];                Service: Urology;  Laterality: Bilateral; 03/19/2020: CYSTOSCOPY WITH BIOPSY; N/A     Comment:  Procedure: CYSTOSCOPY WITH BIOPSY;  Surgeon: Riis Rosina, MD;  Location: [facility];  Service: Urology;                Laterality: N/A; No date: EYE SURGERY 10-31-14: HERNIA REPAIR     Comment:  ventral, retro-rectus atrium mesh 01/18/2019: IRRIGATION AND DEBRIDEMENT FOOT; Left     Comment:  Procedure: IRRIGATION AND DEBRIDEMENT FOOT;  Surgeon:               Neill Boas, DPM;  Location: [facility];  Service:               Podiatry;  Laterality: Left; 12/10/2016: LOWER EXTREMITY ANGIOGRAPHY; Left     Comment:  Procedure: Lower Extremity Angiography;  Surgeon:               Jama Cordella MATSU, MD;  Location: [facility];               Service: Cardiovascular;  Laterality: Left; 02/02/2018: LOWER EXTREMITY ANGIOGRAPHY; Left     Comment:  Procedure: LOWER EXTREMITY ANGIOGRAPHY;  Surgeon:               Jama Cordella MATSU, MD;  Location: [facility];               Service: Cardiovascular;  Laterality: Left; 05/05/2018: LOWER EXTREMITY ANGIOGRAPHY; Left     Comment:  Procedure: LOWER EXTREMITY ANGIOGRAPHY;  Surgeon:               Jama Cordella MATSU, MD;  Location: [facility]  [facility];               Service: Cardiovascular;  Laterality: Left; 12/04/2020: LOWER EXTREMITY ANGIOGRAPHY; Left     Comment:  Procedure: LOWER EXTREMITY ANGIOGRAPHY with               Intervention;  Surgeon: Jama Cordella MATSU, MD;                Location: [facility];  Service: Cardiovascular;              Laterality: Left; 04/24/2023: LOWER EXTREMITY ANGIOGRAPHY; Left      Comment:  Procedure: Lower Extremity Angiography;  Surgeon:               Jama Cordella MATSU, MD;  Location: [facility];               Service: Cardiovascular;  Laterality: Left; 10-31-14: NEPHRECTOMY; Left 05/01/2015: PERIPHERAL VASCULAR CATHETERIZATION; Left     Comment:  Procedure: Lower Extremity Angiography;  Surgeon:               Cordella MATSU Jama, MD;  Location: [facility];                Service: Cardiovascular;  Laterality: Left; 05/01/2015: PERIPHERAL VASCULAR CATHETERIZATION     Comment:  Procedure: Lower Extremity Intervention;  Surgeon:               Cordella MATSU Jama, MD;  Location: [facility];                Service: Cardiovascular;; 02/20/2015: PERIPHERAL VASCULAR CATHETERIZATION; Left     Comment:  Procedure: Pelvic Angiography;  Surgeon: Cordella MATSU Jama, MD;  Location: [facility];  Service:               Cardiovascular;  Laterality: Left; 09/01/2016: TRANSURETHRAL RESECTION OF BLADDER TUMOR WITH MITOMYCIN -C;  N/A     Comment:  Procedure: TRANSURETHRAL RESECTION OF BLADDER TUMOR WITH              MITOMYCIN -C;  Surgeon: Rosina Riis, MD;  Location:               [facility];  Service: Urology;  Laterality: N/A;  BMI    Body Mass Index: 21.13 kg/m      Reproductive/Obstetrics negative OB ROS                             Anesthesia Physical Anesthesia Plan  ASA: 3  Anesthesia Plan: General   Post-op Pain Management: Minimal or no pain anticipated   Induction: Intravenous  PONV Risk Score and Plan: 3 and Ondansetron , Propofol  infusion, TIVA and Midazolam   Airway Management Planned: Natural Airway and Nasal Cannula  Additional Equipment:   Intra-op Plan:   Post-operative Plan:   Informed Consent: I have reviewed the patients History and Physical, chart, labs and discussed the procedure including the risks, benefits and alternatives for the proposed anesthesia with the patient  or authorized representative who has indicated his/her understanding and acceptance.     Dental Advisory Given  Plan Discussed with: Anesthesiologist, CRNA and Surgeon  Anesthesia Plan Comments: (Patient consented for risks of anesthesia including but not limited to:  - adverse reactions to medications - risk of airway placement if required - damage to eyes, teeth,  lips or other oral mucosa - nerve damage due to positioning  - sore throat or hoarseness - Damage to heart, brain, nerves, lungs, other parts of body or loss of life  Patient voiced understanding and assent.)       Anesthesia Quick Evaluation

## 2023-07-14 ENCOUNTER — Encounter: Payer: Self-pay | Admitting: Vascular Surgery

## 2023-07-14 DIAGNOSIS — I739 Peripheral vascular disease, unspecified: Secondary | ICD-10-CM | POA: Diagnosis not present

## 2023-07-14 DIAGNOSIS — D72829 Elevated white blood cell count, unspecified: Secondary | ICD-10-CM | POA: Diagnosis not present

## 2023-07-14 DIAGNOSIS — M79605 Pain in left leg: Secondary | ICD-10-CM | POA: Diagnosis not present

## 2023-07-14 DIAGNOSIS — N179 Acute kidney failure, unspecified: Secondary | ICD-10-CM | POA: Diagnosis not present

## 2023-07-14 LAB — GLUCOSE, CAPILLARY
Glucose-Capillary: 182 mg/dL — ABNORMAL HIGH (ref 70–99)
Glucose-Capillary: 180 mg/dL — ABNORMAL HIGH (ref 70–99)
Glucose-Capillary: 120 mg/dL — ABNORMAL HIGH (ref 70–99)
Glucose-Capillary: 186 mg/dL — ABNORMAL HIGH (ref 70–99)

## 2023-07-14 MED ORDER — INSULIN ASPART 100 UNIT/ML IJ SOLN
4.0000 [IU] | Freq: Three times a day (TID) | INTRAMUSCULAR | Status: DC
  Administered 2023-07-14 – 2023-07-24 (×21): 4 [IU] via SUBCUTANEOUS
  Filled 2023-07-14 (×21): qty 1

## 2023-07-14 NOTE — Progress Notes (Signed)
 Progress Note    07/14/2023 10:39 AM 1 Day Post-Op  Subjective:   Laura Reed is a 63 year old female now status postop day 1 from wound VAC washout and change in the operating room for the 4th time. Wound VAC working well with noted 60 cc of serous drainage to the canister. No complaints overnight. Patient continues to work with physical therapy. Vitals are remained stable.    Vitals:   07/13/23 2243 07/14/23 0852  BP:  (!) 113/42  Pulse: 68 65  Resp:  18  Temp:  98 F (36.7 C)  SpO2: 93% 90%   Physical Exam: Cardiac:  RRR, Normal S1,S2. No murmurs appreciated.  Bradycardia resolved. Lungs: Clear on auscultation throughout but remains diminished in the bases.  Nonlabored breathing.  Nonproductive cough this morning.  Instructed to use bedside incentive spirometry. Incisions: Left groin with wound VAC in place.  Working well. New Yeast infection to surrounding tissue of wound bed.  Extremities: Bilateral lower extremities warm to touch.  Left lower extremity with +2 edema.  Able to palpate pulses but has positive Doppler pulses. Abdomen: Positive bowel sounds throughout, soft, nontender nondistended. Neurologic: Alert and oriented x 3, answers all questions and follows commands appropriately.  CBC    Component Value Date/Time   WBC 8.7 07/10/2023 0506   RBC 2.71 (L) 07/10/2023 0506   HGB 7.8 (L) 07/10/2023 0506   HGB 9.5 (L) 11/02/2014 0609   HCT 23.2 (L) 07/10/2023 0506   HCT 29.5 (L) 11/02/2014 0609   PLT 338 07/10/2023 0506   PLT 217 11/02/2014 0609   MCV 85.6 07/10/2023 0506   MCV 87 11/02/2014 0609   MCH 28.8 07/10/2023 0506   MCHC 33.6 07/10/2023 0506   RDW 14.1 07/10/2023 0506   RDW 13.6 11/02/2014 0609   LYMPHSABS 1.3 07/10/2023 0506   LYMPHSABS 1.2 11/02/2014 0609   MONOABS 0.8 07/10/2023 0506   MONOABS 1.1 (H) 11/02/2014 0609   EOSABS 0.2 07/10/2023 0506   EOSABS 0.3 11/02/2014 0609   BASOSABS 0.1 07/10/2023 0506   BASOSABS 0.0 11/02/2014 0609     BMET    Component Value Date/Time   NA 137 07/10/2023 0506   NA 136 08/02/2021 1629   NA 135 11/02/2014 0609   K 3.5 07/10/2023 0506   K 4.2 11/02/2014 0609   CL 102 07/10/2023 0506   CL 107 11/02/2014 0609   CO2 27 07/10/2023 0506   CO2 23 11/02/2014 0609   GLUCOSE 54 (L) 07/10/2023 0506   GLUCOSE 108 (H) 11/02/2014 0609   BUN 16 07/10/2023 0506   BUN 25 08/02/2021 1629   BUN 20 11/02/2014 0609   CREATININE 0.63 07/10/2023 0506   CREATININE 1.01 11/09/2015 1549   CREATININE 1.01 11/09/2015 1549   CALCIUM  7.7 (L) 07/10/2023 0506   CALCIUM  7.3 (L) 11/02/2014 0609   GFRNONAA >60 07/10/2023 0506   GFRNONAA 50 (L) 11/02/2014 0609   GFRAA >60 01/19/2019 0357   GFRAA 58 (L) 11/02/2014 0609    INR    Component Value Date/Time   INR 1.1 06/21/2023 2304   INR 0.9 10/17/2014 1131     Intake/Output Summary (Last 24 hours) at 07/14/2023 1039 Last data filed at 07/13/2023 1835 Gross per 24 hour  Intake 100 ml  Output 75 ml  Net 25 ml     Assessment/Plan:  63 y.o. female is s/p wound VAC washout with wound VAC change.  1 Day Post-Op   Vascular surgery plans on taking the patient back to the  operating room on 07/16/23 for another wound VAC change out and assessment of the sartorius flap in her left groin.  Patient was reminded she would be made n.p.o. after midnight on 07/16/23.  She verbalized her understanding.  Wishes to proceed.    Dr. Cathlyn Shawl MD is made aware of the plan and he agrees with plan.   DVT prophylaxis: Eliquis  2.5 twice daily, aspirin  81 mg daily, and Lipitor 20 daily     [name] 07/14/2023 10:39 AM

## 2023-07-14 NOTE — Progress Notes (Signed)
 Physical Therapy Treatment Patient Details Name: Laura Reed MRN: [id number] DOB: [birth year] Today's Date: 07/14/2023   History of Present Illness Laura Reed  has presented today for surgery, with the diagnosis of Groin Infection. S/P I & D left groin. On April 29, 2023 she underwent a femoral to tibioperoneal trunk bypass using a PTFE distal flow graft.  She was noted to thrombose within the first 48 hours and subsequently underwent angiography with salvage of her bypass and stenting of the anterior tibial artery.  Following the second procedure which was May 01, 2023 she did well and was discharged to home approximately 10 days later. Wound vac change 12/23.  S/p 07/06/23 wound VAC washout and change.  S/p 07/09/23 I&D of skin, soft tissue, and muscle (for approximately 75 cm2) to L groin; application of Kerecis biologic to wound to L groin; and placement of nondisposable negative pressure dressing.    PT Comments  Pt stated she walked with OT today with no AD.  Felt ok with it stopping occasionally to rest but noted she was more fatigued.  Stated she did not feel good with cane.  Education provided and stated she would like to continue to practice with it but opts for RW this session.  Completes x 1 lap with ease.  Assist only for wound vac management. Anticipate independent gait with RW once wound vac is removed or when changed out for smaller at home system.   If plan is discharge home, recommend the following: A little help with walking and/or transfers;A little help with bathing/dressing/bathroom;Assistance with cooking/housework;Help with stairs or ramp for entrance;Assist for transportation   Can travel by private vehicle        Equipment Recommendations  Rolling walker (2 wheels)    Recommendations for Other Services       Precautions / Restrictions Precautions Precautions: Fall Restrictions Weight Bearing Restrictions Per Provider Order: No     Mobility  Bed  Mobility Overal bed mobility: Independent               Patient Response: Cooperative  Transfers Overall transfer level: Independent                      Ambulation/Gait Ambulation/Gait assistance: Supervision Gait Distance (Feet): 200 Feet Assistive device: Rolling walker (2 wheels)   Gait velocity: decreased     General Gait Details: asssit to manage wound vac   Stairs             Wheelchair Mobility     Tilt Bed Tilt Bed Patient Response: Cooperative  Modified Rankin (Stroke Patients Only)       Balance Overall balance assessment: Modified Independent                                          Cognition Arousal: Alert Behavior During Therapy: WFL for tasks assessed/performed Overall Cognitive Status: Within Functional Limits for tasks assessed                                          Exercises      General Comments        Pertinent Vitals/Pain Pain Assessment Pain Assessment: No/denies pain    Home Living  Prior Function            PT Goals (current goals can now be found in the care plan section) Progress towards PT goals: Progressing toward goals    Frequency    Min 1X/week      PT Plan      Co-evaluation              AM-PAC PT 6 Clicks Mobility   Outcome Measure  Help needed turning from your back to your side while in a flat bed without using bedrails?: None Help needed moving from lying on your back to sitting on the side of a flat bed without using bedrails?: None Help needed moving to and from a bed to a chair (including a wheelchair)?: None Help needed standing up from a chair using your arms (e.g., wheelchair or bedside chair)?: None Help needed to walk in hospital room?: A Little Help needed climbing 3-5 steps with a railing? : A Little 6 Click Score: 22    End of Session   Activity Tolerance: Patient tolerated treatment  well Patient left:  (sitting at EOB) Nurse Communication: Mobility status;Precautions PT Visit Diagnosis: Other abnormalities of gait and mobility (R26.89);Unsteadiness on feet (R26.81);Pain;Difficulty in walking, not elsewhere classified (R26.2);Muscle weakness (generalized) (M62.81) Pain - Right/Left: Left Pain - part of body: Hip     Time: 8550-8542 PT Time Calculation (min) (ACUTE ONLY): 8 min  Charges:    $Gait Training: 8-22 mins PT General Charges $$ ACUTE PT VISIT: 1 Visit                   Laura Reed, PTA 07/14/23, 3:18 PM

## 2023-07-14 NOTE — Progress Notes (Signed)
 Occupational Therapy Treatment Patient Details Name: Laura Reed MRN: [id number] DOB: 11-20-1960 Today's Date: 07/14/2023   History of present illness ZYKERRIA TANTON  has presented today for surgery, with the diagnosis of Groin Infection. S/P I & D left groin. On April 29, 2023 she underwent a femoral to tibioperoneal trunk bypass using a PTFE distal flow graft.  She was noted to thrombose within the first 48 hours and subsequently underwent angiography with salvage of her bypass and stenting of the anterior tibial artery.  Following the second procedure which was May 01, 2023 she did well and was discharged to home approximately 10 days later. Wound vac change 12/23.  S/p 07/06/23 wound VAC washout and change.  S/p 07/09/23 I&D of skin, soft tissue, and muscle (for approximately 75 cm2) to L groin; application of Kerecis biologic to wound to L groin; and placement of nondisposable negative pressure dressing.   OT comments  Ms Nimmons was seen for OT treatment on this date. Upon arrival to room pt in bed, agreeable to tx. Pt demonstrates near baseline mobility for all ADLs. CGA for initial ADL t/f ~200 ft, improves to IND for toilet t/f ~10 ft including line mgmt. Pt verbalized plan to implement falls prevention and energy conservation strategies on return home. Goals met and education complete, No OT follow up recommended. Will sign off.       If plan is discharge home, recommend the following:  A little help with walking and/or transfers;A little help with bathing/dressing/bathroom;Assistance with cooking/housework;Assist for transportation;Help with stairs or ramp for entrance   Equipment Recommendations  Tub/shower seat    Recommendations for Other Services      Precautions / Restrictions Precautions Precautions: Fall Restrictions Weight Bearing Restrictions Per Provider Order: No       Mobility Bed Mobility Overal bed mobility: Independent                   Transfers Overall transfer level: Independent                       Balance Overall balance assessment: Modified Independent                                         ADL either performed or assessed with clinical judgement   ADL Overall ADL's : Modified independent                                       General ADL Comments: CGA for ADL t/f ~200 ft, improves to IND for toilet t/f ~10 ft including line mgmt.      Cognition Arousal: Alert Behavior During Therapy: WFL for tasks assessed/performed Overall Cognitive Status: Within Functional Limits for tasks assessed                                                     Pertinent Vitals/ Pain       Pain Assessment Pain Assessment: No/denies pain   Frequency  Min 1X/week        Progress Toward Goals  OT Goals(current goals can now be found in the care plan section)  Progress towards OT goals: Goals met/education completed, patient discharged from OT  ADL Goals Additional ADL Goal #1: Pt will verbalize plan to implement at least 2 learned ECS to support safety with ADL/mobility. Additional ADL Goal #2: Pt will complete all aspects of a shower primarily from seated position wiht modified independence, incorporating learned ECS without VC, 1/1 opportunity.   AM-PAC OT 6 Clicks Daily Activity     Outcome Measure   Help from another person eating meals?: None Help from another person taking care of personal grooming?: None Help from another person toileting, which includes using toliet, bedpan, or urinal?: None Help from another person bathing (including washing, rinsing, drying)?: A Little Help from another person to put on and taking off regular upper body clothing?: None Help from another person to put on and taking off regular lower body clothing?: None 6 Click Score: 23    End of Session Equipment Utilized During Treatment: Gait belt  OT Visit  Diagnosis: Other abnormalities of gait and mobility (R26.89)   Activity Tolerance Patient tolerated treatment well   Patient Left in bed;with call bell/phone within reach   Nurse Communication          Time: 8943-8886 OT Time Calculation (min): 17 min  Charges: OT General Charges $OT Visit: 1 Visit OT Treatments $Self Care/Home Management : 8-22 mins  Elston Slot, M.S. OTR/L  07/14/23, 11:23 AM  ascom [phone]

## 2023-07-14 NOTE — Progress Notes (Signed)
 Progress Note   Patient: Laura Reed FMW:[id number] DOB: [birth year] DOA: 06/21/2023     23 DOS: the patient was seen and examined on 07/14/2023      Brief hospital course:  HPI: 63 y.o female with significant PMH of HTN, HLD, DM, sCHF, CAD, PAD (s/p of left common femoral-distal bypass, thrombectomy, angioplasty and stent placement 04/2023 on aspirin , Plavix  and Eliquis ), anxiety, bladder cancer (s/p of nephrectomy). [facility] on 06/21/23 with complaints of right groin pain, redness and oozing post op. Patient has hx of PAD and recently underwent left common femoral-distal bypass, thrombectomy, angioplasty and stent placement 04/2023 and right transmetatarsal amputation. SBP in the 50s and was sent to the ED for further evaluation.  Patient was initially admitted to hospitalist service later transferred to ICU for pressor support.  Patient has undergone multiple trips to the OR for wound debridement and wound VAC exchange.  1/3: Hemodynamically stable.  Going back to the OR on Monday.  Patient will need need wound VAC for many weeks. TOC having issues finding and home health agency.  1/4: Had an episode of hypoglycemia earlier this morning.  Requiring intervention.  Semglee  dose was decreased to 10 from 15 at night, also making sensitive SSI.  1/5: CBG elevated this morning so increasing the dose of Semglee  to 12 units.  Going for another wound VAC change with vascular surgery tomorrow, n.p.o. after midnight.  1/6: Patient had another wound VAC exchange and further debridement in OR with vascular surgery today.  1/7: Remained hemodynamically stable, going back to the OR on 07/16/2023     Consultants:  [facility] Vascular Surgery  Infectious disease    Procedures/Surgeries: 06/24/23: Excisional debridement of left groin abscess. Rotation of a sartorius muscle flap for coverage of the existing bypass grafts. Placement of a nondisposable wound VAC dressing left groin. Redo vascular surgery  - Drs. [name] and Dew 06/26/23: Excisional debridement left groin wound. Placement of a nondisposable VAC dressing - Dr Jama 06/29/23: Left groin VAC dressing change - Dr Jama   07/02/23: Irrigation and debridement of skin, soft tissue, and muscle for approximately 125 cm to the left groin wound, Pulse lavage irrigation left groin. Left groin VAC dressing change - Dr Marea      Going back to OR on 07/16/2023    ASSESSMENT & PLAN:   Left groin abscess Sepsis with septic shock resolved Status post I&D with vascular surgery 12/18 with takeback to the OR for left groin debridement, muscle flap, wound VAC exchange 12/20, wound vac exchange 12/23 ID on board and has recommended antibiotics with end date on 07/22/2023 Pain control Patient underwent wound VAC exchange on 07/06/2023 followed by 07/12/2024 Likely will get another 1 next week Follow vascular surgery recommendations   PAD s/p of left common femoral-tibioperoneal trunk bypass, thrombectomy, angioplasty and stent placement 04/2023  on aspirin , Plavix  and Eliquis   Continue eliquis  and aspirin  Continue statin therapy Continue aspirin    Leukocytosis-resolved Continue current antibiotics   Normocytic anemia Expected ABLA d/t surgeries normal hemoglobin prior to vascular procedures.   some blood tinge output in her wound VAC S/p 1 unit of packed red blood cells by vascular team.  Hemoglobin at 7.8 today -Starting on supplement -Continue to monitor -Transfuse below 7  AKI - resolved Likely d/t septic shock resolved Continue to monitor renal function   Hypokalemia D/t Lasix   Replace as needed Monitor BMP   PVC Patient noted to have frequent PVCs on tele This is likely causing her heart  rate to appear to be lower than normal.  Will ensure potassium >4, Magnesium >2 Continue to monitor   Hyponatremia  Resolved Monitor sodium level   Chronic diastolic HF Edema bilateral LE Monitor fluid status  Diuresis prn   Monitor BMP w/ diuresis   Hypoalbuminemia  Encourage po intake    HTN  HLD Continue statin Hold home antihypertensives. Mostly normotensive off of midodrine  Continue to monitor blood pressure   T2DM  A1c 8, CBG elevated this morning after decreasing the dose of Semglee  Increase Semglee  to 12 units at night Continue mealtime 3 units  daily Continue with sensitive SSI       DVT prophylaxis: ELiquis     Central lines / invasive devices: wound vac    Code Status: FULL CODE   TOC needs: home health  Barriers to dispo / significant pending items: wound vac in place, iv abx, planning wound vac change in OR      Subjective Patient was seen and examined today.  No new concern.   Family Communication:      Physical Exam General.  Well-developed lady, in no acute distress. Pulmonary.  Lungs clear bilaterally, normal respiratory effort. CV.  Regular rate and rhythm, no JVD, rub or murmur. Abdomen.  Soft, nontender, nondistended, BS positive. CNS.  Alert and oriented .  No focal neurologic deficit. Extremities.  Trace on right LE and 1+ on left LE edema    Data Reviewed:     Latest Ref Rng & Units 07/10/2023    5:06 AM 07/09/2023    5:00 AM 07/08/2023    4:46 AM  BMP  Glucose 70 - 99 mg/dL 54  867  859   BUN 8 - 23 mg/dL 16  17  16    Creatinine 0.44 - 1.00 mg/dL 9.36  9.40  9.31   Sodium 135 - 145 mmol/L 137  136  136   Potassium 3.5 - 5.1 mmol/L 3.5  3.5  3.9   Chloride 98 - 111 mmol/L 102  102  100   CO2 22 - 32 mmol/L 27  24  29    Calcium  8.9 - 10.3 mg/dL 7.7  7.9  8.3     Vitals:   07/13/23 2241 07/13/23 2243 07/14/23 0450 07/14/23 0852  BP: (!) 121/42   (!) 113/42  Pulse: 69 68  65  Resp: 18   18  Temp: 98.8 F (37.1 C)   98 F (36.7 C)  TempSrc: Oral   Oral  SpO2: (!) 76% 93%  90%  Weight:   67.4 kg   Height:          Latest Ref Rng & Units 07/10/2023    5:06 AM 07/09/2023    5:00 AM 07/08/2023    4:46 AM  CBC  WBC 4.0 - 10.5 K/uL 8.7  9.0  8.9    Hemoglobin 12.0 - 15.0 g/dL 7.8  7.7  8.5   Hematocrit 36.0 - 46.0 % 23.2  22.6  25.8   Platelets 150 - 400 K/uL 338  323  354      Author: Amaryllis Dare, MD 07/14/2023 4:45 PM  For on call review [email or web address].

## 2023-07-14 NOTE — Plan of Care (Signed)

## 2023-07-15 ENCOUNTER — Ambulatory Visit: Payer: 59 | Admitting: Internal Medicine

## 2023-07-15 DIAGNOSIS — B379 Candidiasis, unspecified: Secondary | ICD-10-CM

## 2023-07-15 DIAGNOSIS — N179 Acute kidney failure, unspecified: Secondary | ICD-10-CM | POA: Diagnosis not present

## 2023-07-15 DIAGNOSIS — D72829 Elevated white blood cell count, unspecified: Secondary | ICD-10-CM | POA: Diagnosis not present

## 2023-07-15 DIAGNOSIS — M79605 Pain in left leg: Secondary | ICD-10-CM | POA: Diagnosis not present

## 2023-07-15 DIAGNOSIS — B9620 Unspecified Escherichia coli [E. coli] as the cause of diseases classified elsewhere: Secondary | ICD-10-CM | POA: Diagnosis not present

## 2023-07-15 DIAGNOSIS — L02214 Cutaneous abscess of groin: Secondary | ICD-10-CM | POA: Diagnosis not present

## 2023-07-15 DIAGNOSIS — T827XXA Infection and inflammatory reaction due to other cardiac and vascular devices, implants and grafts, initial encounter: Secondary | ICD-10-CM | POA: Diagnosis not present

## 2023-07-15 DIAGNOSIS — I739 Peripheral vascular disease, unspecified: Secondary | ICD-10-CM | POA: Diagnosis not present

## 2023-07-15 LAB — GLUCOSE, CAPILLARY
Glucose-Capillary: 156 mg/dL — ABNORMAL HIGH (ref 70–99)
Glucose-Capillary: 171 mg/dL — ABNORMAL HIGH (ref 70–99)
Glucose-Capillary: 79 mg/dL (ref 70–99)
Glucose-Capillary: 256 mg/dL — ABNORMAL HIGH (ref 70–99)

## 2023-07-15 MED ORDER — FLUCONAZOLE 100 MG PO TABS
200.0000 mg | ORAL_TABLET | Freq: Every day | ORAL | Status: AC
  Administered 2023-07-16 – 2023-07-17 (×2): 200 mg via ORAL
  Filled 2023-07-15 (×2): qty 2

## 2023-07-15 MED ORDER — BISACODYL 10 MG RE SUPP
10.0000 mg | Freq: Once | RECTAL | Status: AC
  Administered 2023-07-15: 10 mg via RECTAL
  Filled 2023-07-15: qty 1

## 2023-07-15 NOTE — Progress Notes (Signed)
 Progress Note   Patient: Laura Reed FMW:[id number] DOB: 1961/02/08 DOA: 06/21/2023     24 DOS: the patient was seen and examined on 07/15/2023      Brief hospital course:  HPI: 63 y.o female with significant PMH of HTN, HLD, DM, sCHF, CAD, PAD (s/p of left common femoral-distal bypass, thrombectomy, angioplasty and stent placement 04/2023 on aspirin , Plavix  and Eliquis ), anxiety, bladder cancer (s/p of nephrectomy). [facility] on 06/21/23 with complaints of right groin pain, redness and oozing post op. Patient has hx of PAD and recently underwent left common femoral-distal bypass, thrombectomy, angioplasty and stent placement 04/2023 and right transmetatarsal amputation. SBP in the 50s and was sent to the ED for further evaluation.  Patient was initially admitted to hospitalist service later transferred to ICU for pressor support.  Patient has undergone multiple trips to the OR for wound debridement and wound VAC exchange.  1/3: Hemodynamically stable.  Going back to the OR on Monday.  Patient will need need wound VAC for many weeks. TOC having issues finding and home health agency.  1/4: Had an episode of hypoglycemia earlier this morning.  Requiring intervention.  Semglee  dose was decreased to 10 from 15 at night, also making sensitive SSI.  1/5: CBG elevated this morning so increasing the dose of Semglee  to 12 units.  Going for another wound VAC change with vascular surgery tomorrow, n.p.o. after midnight.  1/6: Patient had another wound VAC exchange and further debridement in OR with vascular surgery today.  1/7: Remained hemodynamically stable, going back to the OR on 07/16/2023  1/8: Hemodynamically stable, going back to the OR tomorrow     Consultants:  [facility] Vascular Surgery  Infectious disease    Procedures/Surgeries: 06/24/23: Excisional debridement of left groin abscess. Rotation of a sartorius muscle flap for coverage of the existing bypass grafts. Placement of a  nondisposable wound VAC dressing left groin. Redo vascular surgery - Drs. [name] and Dew 06/26/23: Excisional debridement left groin wound. Placement of a nondisposable VAC dressing - Dr Jama 06/29/23: Left groin VAC dressing change - Dr Jama   07/02/23: Irrigation and debridement of skin, soft tissue, and muscle for approximately 125 cm to the left groin wound, Pulse lavage irrigation left groin. Left groin VAC dressing change - Dr Marea      Going back to OR on 07/16/2023    ASSESSMENT & PLAN:   Left groin abscess Sepsis with septic shock resolved Status post I&D with vascular surgery 12/18 with takeback to the OR for left groin debridement, muscle flap, wound VAC exchange 12/20, wound vac exchange 12/23 ID on board and has recommended antibiotics with end date on 07/22/2023 Pain control Patient underwent wound VAC exchange on 07/06/2023 followed by 07/12/2024 Likely will get another 1 next week Follow vascular surgery recommendations   PAD s/p of left common femoral-tibioperoneal trunk bypass, thrombectomy, angioplasty and stent placement 04/2023  on aspirin , Plavix  and Eliquis   Continue eliquis  and aspirin  Continue statin therapy Continue aspirin    Leukocytosis-resolved Continue current antibiotics   Normocytic anemia Expected ABLA d/t surgeries normal hemoglobin prior to vascular procedures.   some blood tinge output in her wound VAC S/p 1 unit of packed red blood cells by vascular team.  Hemoglobin at 7.8 today -Starting on supplement -Continue to monitor -Transfuse below 7  AKI - resolved Likely d/t septic shock resolved Continue to monitor renal function   Hypokalemia D/t Lasix   Replace as needed Monitor BMP   PVC Patient noted to have  frequent PVCs on tele This is likely causing her heart rate to appear to be lower than normal.  Will ensure potassium >4, Magnesium >2 Continue to monitor   Hyponatremia  Resolved Monitor sodium level   Chronic diastolic  HF Edema bilateral LE Monitor fluid status  Diuresis prn  Monitor BMP w/ diuresis   Hypoalbuminemia  Encourage po intake    HTN  HLD Continue statin Hold home antihypertensives. Mostly normotensive off of midodrine  Continue to monitor blood pressure   T2DM  A1c 8, CBG elevated this morning after decreasing the dose of Semglee  Increase Semglee  to 12 units at night Continue mealtime 3 units  daily Continue with sensitive SSI       DVT prophylaxis: ELiquis     Central lines / invasive devices: wound vac    Code Status: FULL CODE   TOC needs: home health  Barriers to dispo / significant pending items: wound vac in place, iv abx, planning wound vac change in OR      Subjective Patient with no new concern.  Walking around the unit.   Family Communication:      Physical Exam General.  Well-developed lady, in no acute distress. Pulmonary.  Lungs clear bilaterally, normal respiratory effort. CV.  Regular rate and rhythm, no JVD, rub or murmur. Abdomen.  Soft, nontender, nondistended, BS positive. CNS.  Alert and oriented .  No focal neurologic deficit. Extremities.  Trace RLE, 1+ LLE, wound VAC on left groin Psychiatry.  Judgment and insight appears normal.     Data Reviewed:     Latest Ref Rng & Units 07/10/2023    5:06 AM 07/09/2023    5:00 AM 07/08/2023    4:46 AM  BMP  Glucose 70 - 99 mg/dL 54  867  859   BUN 8 - 23 mg/dL 16  17  16    Creatinine 0.44 - 1.00 mg/dL 9.36  9.40  9.31   Sodium 135 - 145 mmol/L 137  136  136   Potassium 3.5 - 5.1 mmol/L 3.5  3.5  3.9   Chloride 98 - 111 mmol/L 102  102  100   CO2 22 - 32 mmol/L 27  24  29    Calcium  8.9 - 10.3 mg/dL 7.7  7.9  8.3     Vitals:   07/14/23 2149 07/15/23 0500 07/15/23 0811 07/15/23 1537  BP: (!) 129/50  (!) 131/53 (!) 119/43  Pulse: 71  61 68  Resp: 18  17 16   Temp: 98.2 F (36.8 C)     TempSrc: Oral     SpO2: 91%  92% 93%  Weight:  69 kg    Height:          Latest Ref Rng & Units 07/10/2023     5:06 AM 07/09/2023    5:00 AM 07/08/2023    4:46 AM  CBC  WBC 4.0 - 10.5 K/uL 8.7  9.0  8.9   Hemoglobin 12.0 - 15.0 g/dL 7.8  7.7  8.5   Hematocrit 36.0 - 46.0 % 23.2  22.6  25.8   Platelets 150 - 400 K/uL 338  323  354      Author: Amaryllis Dare, MD 07/15/2023 3:44 PM  For on call review [email or web address].

## 2023-07-15 NOTE — Progress Notes (Signed)
 ID Pt doing wellr Santina for vac change and also had some debridement of non viable tissue Sister at bed side   O/e awake and alert Patient Vitals for the past 24 hrs:  BP Temp Temp src Pulse Resp SpO2 Weight  07/15/23 1537 (!) 119/43 -- -- 68 16 93 % --  07/15/23 0811 (!) 131/53 -- -- 61 17 92 % --  07/15/23 0500 -- -- -- -- -- -- 69 kg  07/14/23 2149 (!) 129/50 98.2 F (36.8 C) Oral 71 18 91 % --   Chest CTA Hss1s2 Abd soft Left groin wound  picture reviewed      Edema legs- left > rt Left TMA  CNS non focal   Labs    Latest Ref Rng & Units 07/10/2023    5:06 AM 07/09/2023    5:00 AM 07/08/2023    4:46 AM  CBC  WBC 4.0 - 10.5 K/uL 8.7  9.0  8.9   Hemoglobin 12.0 - 15.0 g/dL 7.8  7.7  8.5   Hematocrit 36.0 - 46.0 % 23.2  22.6  25.8   Platelets 150 - 400 K/uL 338  323  354        Latest Ref Rng & Units 07/10/2023    5:06 AM 07/09/2023    5:00 AM 07/08/2023    4:46 AM  CMP  Glucose 70 - 99 mg/dL 54  867  859   BUN 8 - 23 mg/dL 16  17  16    Creatinine 0.44 - 1.00 mg/dL 9.36  9.40  9.31   Sodium 135 - 145 mmol/L 137  136  136   Potassium 3.5 - 5.1 mmol/L 3.5  3.5  3.9   Chloride 98 - 111 mmol/L 102  102  100   CO2 22 - 32 mmol/L 27  24  29    Calcium  8.9 - 10.3 mg/dL 7.7  7.9  8.3     Micro Groin abscess- Ecoli  Impression/recommendation PAD s/p left common femoral artery to tibioperoneal trunk artery bypass  with PTFE graft in OCT 2024   Abscess due to Ecoli  at the surgical site- CT showed  10 cm complex collection s/p excisional debridement of the abscess and rotation of a sarorius muscle flap for coverage of existing bypass grafts 06/24/23 Repeat culture from 12/26 is negative   Day 23 of antibiotic Discussed with Dr. Jama . As vascular graft is exposed will treat like endovascular infection and give IV antibiotic for 4 weeks until 07/22/23 Unasyn  while in hospital and ceftriaxone on discharge Leucocytosis-resolved  Candidiasis of the skin  around wound-  started fluconazole    day 3- ding much better   Left TMA- healed well  ecoli and enterococcus in  culture in sept when she had TMA   CAD s/p CABG   H/o bladder ca s/p left nephrectomy  Solitary kidney- avoid nephrotoxic drugs   AKI-  resolved   Discussed the management with the patient

## 2023-07-15 NOTE — TOC Progression Note (Signed)
 Transition of Care [facility]) - Progression Note    Patient Details  Name: Laura Reed MRN: [id number] Date of Birth: 1960-08-10  [facility]) CM/SW Contact  Royanne JINNY Bernheim, RN Phone Number: 07/15/2023, 12:35 PM  Clinical Narrative:     TOC continues to follow the patient for needs, AT this time, there are still no HH agencies to accept the patient for IV ABX as well as wound vac care, she will need wound vac for 6 weeks, and IV ABX until 1/15 Going back to OR for wound Vac change 07/16/23    Barriers to Discharge: No Home Care Agency will accept this patient  Expected Discharge Plan and Services                                               Social Determinants of Health (SDOH) Interventions SDOH Screenings   Food Insecurity: No Food Insecurity (06/23/2023)  Recent Concern: Food Insecurity - Food Insecurity Present (05/26/2023)   Received from [facility]  Housing: Low Risk  (06/23/2023)  Transportation Needs: No Transportation Needs (06/23/2023)  Utilities: Not At Risk (06/23/2023)  Alcohol Screen: Low Risk  (06/17/2023)  Depression (PHQ2-9): Low Risk  (06/17/2023)  Financial Resource Strain: Low Risk  (06/21/2023)   Received from [facility]  Physical Activity: Inactive (06/17/2023)  Social Connections: Socially Isolated (06/17/2023)  Stress: Stress Concern Present (06/17/2023)  Tobacco Use: Medium Risk (07/13/2023)  Health Literacy: Inadequate Health Literacy (06/17/2023)    Readmission Risk Interventions    06/22/2023   11:01 AM  Readmission Risk Prevention Plan  Transportation Screening Complete  PCP or Specialist Appt within 3-5 Days Complete  Social Work Consult for Recovery Care Planning/Counseling Complete  Palliative Care Screening Not Applicable  Medication Review Oceanographer) Complete

## 2023-07-15 NOTE — H&P (View-Only) (Signed)
 Progress Note    07/15/2023 1:18 PM 2 Days Post-Op  Subjective:  Laura Reed is a 63 year old female now status postop day 2 from wound VAC washout and change in the operating room for the 4th time. Wound VAC working well with noted 40 cc of serous drainage to the canister. No complaints overnight. Patient continues to work with physical therapy. Vitals are remained stable.    Vitals:   07/14/23 2149 07/15/23 0811  BP: (!) 129/50 (!) 131/53  Pulse: 71 61  Resp: 18 17  Temp: 98.2 F (36.8 C)   SpO2: 91% 92%   Physical Exam: Cardiac:  RRR, Normal S1,S2. No murmurs appreciated.  Bradycardia resolved. Lungs: Clear on auscultation throughout but remains diminished in the bases.  Nonlabored breathing.  Nonproductive cough this morning.  Instructed to use bedside incentive spirometry. Incisions: Left groin with wound VAC in place.  Working well. New Yeast infection to surrounding tissue of wound bed.  Extremities: Bilateral lower extremities warm to touch.  Left lower extremity with +2 edema.  Able to palpate pulses but has positive Doppler pulses. Abdomen: Positive bowel sounds throughout, soft, nontender nondistended. Neurologic: Alert and oriented x 3, answers all questions and follows commands appropriately.  CBC    Component Value Date/Time   WBC 8.7 07/10/2023 0506   RBC 2.71 (L) 07/10/2023 0506   HGB 7.8 (L) 07/10/2023 0506   HGB 9.5 (L) 11/02/2014 0609   HCT 23.2 (L) 07/10/2023 0506   HCT 29.5 (L) 11/02/2014 0609   PLT 338 07/10/2023 0506   PLT 217 11/02/2014 0609   MCV 85.6 07/10/2023 0506   MCV 87 11/02/2014 0609   MCH 28.8 07/10/2023 0506   MCHC 33.6 07/10/2023 0506   RDW 14.1 07/10/2023 0506   RDW 13.6 11/02/2014 0609   LYMPHSABS 1.3 07/10/2023 0506   LYMPHSABS 1.2 11/02/2014 0609   MONOABS 0.8 07/10/2023 0506   MONOABS 1.1 (H) 11/02/2014 0609   EOSABS 0.2 07/10/2023 0506   EOSABS 0.3 11/02/2014 0609   BASOSABS 0.1 07/10/2023 0506   BASOSABS 0.0 11/02/2014  0609    BMET    Component Value Date/Time   NA 137 07/10/2023 0506   NA 136 08/02/2021 1629   NA 135 11/02/2014 0609   K 3.5 07/10/2023 0506   K 4.2 11/02/2014 0609   CL 102 07/10/2023 0506   CL 107 11/02/2014 0609   CO2 27 07/10/2023 0506   CO2 23 11/02/2014 0609   GLUCOSE 54 (L) 07/10/2023 0506   GLUCOSE 108 (H) 11/02/2014 0609   BUN 16 07/10/2023 0506   BUN 25 08/02/2021 1629   BUN 20 11/02/2014 0609   CREATININE 0.63 07/10/2023 0506   CREATININE 1.01 11/09/2015 1549   CREATININE 1.01 11/09/2015 1549   CALCIUM  7.7 (L) 07/10/2023 0506   CALCIUM  7.3 (L) 11/02/2014 0609   GFRNONAA >60 07/10/2023 0506   GFRNONAA 50 (L) 11/02/2014 0609   GFRAA >60 01/19/2019 0357   GFRAA 58 (L) 11/02/2014 0609    INR    Component Value Date/Time   INR 1.1 06/21/2023 2304   INR 0.9 10/17/2014 1131     Intake/Output Summary (Last 24 hours) at 07/15/2023 1318 Last data filed at 07/14/2023 1917 Gross per 24 hour  Intake 240 ml  Output --  Net 240 ml     Assessment/Plan:  63 y.o. female is s/p wound VAC washout with wound VAC change.  2 Days Post-Op   Vascular surgery plans on taking the patient back to the operating  room on 07/16/23 for another wound VAC change out and assessment of the sartorius flap in her left groin.  Patient was reminded she would be made n.p.o. after midnight on 07/16/23.  She verbalized her understanding.  Wishes to proceed.    Dr. Cathlyn Shawl MD is made aware of the plan and he agrees with plan.   DVT prophylaxis: Eliquis  2.5 twice daily, aspirin  81 mg daily, and Lipitor 20 daily   [facility] 07/15/2023 1:18 PM

## 2023-07-15 NOTE — Progress Notes (Signed)
 Progress Note    07/15/2023 1:18 PM 2 Days Post-Op  Subjective:  Laura Reed is a 63 year old female now status postop day 2 from wound VAC washout and change in the operating room for the 4th time. Wound VAC working well with noted 40 cc of serous drainage to the canister. No complaints overnight. Patient continues to work with physical therapy. Vitals are remained stable.    Vitals:   07/14/23 2149 07/15/23 0811  BP: (!) 129/50 (!) 131/53  Pulse: 71 61  Resp: 18 17  Temp: 98.2 F (36.8 C)   SpO2: 91% 92%   Physical Exam: Cardiac:  RRR, Normal S1,S2. No murmurs appreciated.  Bradycardia resolved. Lungs: Clear on auscultation throughout but remains diminished in the bases.  Nonlabored breathing.  Nonproductive cough this morning.  Instructed to use bedside incentive spirometry. Incisions: Left groin with wound VAC in place.  Working well. New Yeast infection to surrounding tissue of wound bed.  Extremities: Bilateral lower extremities warm to touch.  Left lower extremity with +2 edema.  Able to palpate pulses but has positive Doppler pulses. Abdomen: Positive bowel sounds throughout, soft, nontender nondistended. Neurologic: Alert and oriented x 3, answers all questions and follows commands appropriately.  CBC    Component Value Date/Time   WBC 8.7 07/10/2023 0506   RBC 2.71 (L) 07/10/2023 0506   HGB 7.8 (L) 07/10/2023 0506   HGB 9.5 (L) 11/02/2014 0609   HCT 23.2 (L) 07/10/2023 0506   HCT 29.5 (L) 11/02/2014 0609   PLT 338 07/10/2023 0506   PLT 217 11/02/2014 0609   MCV 85.6 07/10/2023 0506   MCV 87 11/02/2014 0609   MCH 28.8 07/10/2023 0506   MCHC 33.6 07/10/2023 0506   RDW 14.1 07/10/2023 0506   RDW 13.6 11/02/2014 0609   LYMPHSABS 1.3 07/10/2023 0506   LYMPHSABS 1.2 11/02/2014 0609   MONOABS 0.8 07/10/2023 0506   MONOABS 1.1 (H) 11/02/2014 0609   EOSABS 0.2 07/10/2023 0506   EOSABS 0.3 11/02/2014 0609   BASOSABS 0.1 07/10/2023 0506   BASOSABS 0.0 11/02/2014  0609    BMET    Component Value Date/Time   NA 137 07/10/2023 0506   NA 136 08/02/2021 1629   NA 135 11/02/2014 0609   K 3.5 07/10/2023 0506   K 4.2 11/02/2014 0609   CL 102 07/10/2023 0506   CL 107 11/02/2014 0609   CO2 27 07/10/2023 0506   CO2 23 11/02/2014 0609   GLUCOSE 54 (L) 07/10/2023 0506   GLUCOSE 108 (H) 11/02/2014 0609   BUN 16 07/10/2023 0506   BUN 25 08/02/2021 1629   BUN 20 11/02/2014 0609   CREATININE 0.63 07/10/2023 0506   CREATININE 1.01 11/09/2015 1549   CREATININE 1.01 11/09/2015 1549   CALCIUM  7.7 (L) 07/10/2023 0506   CALCIUM  7.3 (L) 11/02/2014 0609   GFRNONAA >60 07/10/2023 0506   GFRNONAA 50 (L) 11/02/2014 0609   GFRAA >60 01/19/2019 0357   GFRAA 58 (L) 11/02/2014 0609    INR    Component Value Date/Time   INR 1.1 06/21/2023 2304   INR 0.9 10/17/2014 1131     Intake/Output Summary (Last 24 hours) at 07/15/2023 1318 Last data filed at 07/14/2023 1917 Gross per 24 hour  Intake 240 ml  Output --  Net 240 ml     Assessment/Plan:  63 y.o. female is s/p wound VAC washout with wound VAC change.  2 Days Post-Op   Vascular surgery plans on taking the patient back to the operating  room on 07/16/23 for another wound VAC change out and assessment of the sartorius flap in her left groin.  Patient was reminded she would be made n.p.o. after midnight on 07/16/23.  She verbalized her understanding.  Wishes to proceed.    Dr. Cathlyn Shawl MD is made aware of the plan and he agrees with plan.   DVT prophylaxis: Eliquis  2.5 twice daily, aspirin  81 mg daily, and Lipitor 20 daily   [name] [facility] 07/15/2023 1:18 PM

## 2023-07-16 ENCOUNTER — Inpatient Hospital Stay: Payer: 59 | Admitting: Anesthesiology

## 2023-07-16 ENCOUNTER — Encounter
Admission: EM | Disposition: A | Discharge status: Home Health | Payer: Self-pay | Source: Home / Self Care | Attending: Internal Medicine

## 2023-07-16 ENCOUNTER — Encounter: Payer: Self-pay | Admitting: Internal Medicine

## 2023-07-16 ENCOUNTER — Other Ambulatory Visit: Payer: Self-pay

## 2023-07-16 DIAGNOSIS — N179 Acute kidney failure, unspecified: Secondary | ICD-10-CM | POA: Diagnosis not present

## 2023-07-16 DIAGNOSIS — I739 Peripheral vascular disease, unspecified: Secondary | ICD-10-CM | POA: Diagnosis not present

## 2023-07-16 DIAGNOSIS — D72829 Elevated white blood cell count, unspecified: Secondary | ICD-10-CM | POA: Diagnosis not present

## 2023-07-16 DIAGNOSIS — M79605 Pain in left leg: Secondary | ICD-10-CM | POA: Diagnosis not present

## 2023-07-16 HISTORY — PX: APPLICATION OF WOUND VAC: SHX5189

## 2023-07-16 LAB — GLUCOSE, CAPILLARY
Glucose-Capillary: 238 mg/dL — ABNORMAL HIGH (ref 70–99)
Glucose-Capillary: 190 mg/dL — ABNORMAL HIGH (ref 70–99)
Glucose-Capillary: 134 mg/dL — ABNORMAL HIGH (ref 70–99)
Glucose-Capillary: 123 mg/dL — ABNORMAL HIGH (ref 70–99)
Glucose-Capillary: 149 mg/dL — ABNORMAL HIGH (ref 70–99)

## 2023-07-16 SURGERY — APPLICATION, WOUND VAC
Anesthesia: General | Site: Groin | Laterality: Left

## 2023-07-16 MED ORDER — FENTANYL CITRATE (PF) 100 MCG/2ML IJ SOLN
INTRAMUSCULAR | Status: AC
  Filled 2023-07-16: qty 2

## 2023-07-16 MED ORDER — FENTANYL CITRATE (PF) 100 MCG/2ML IJ SOLN
INTRAMUSCULAR | Status: DC | PRN
  Administered 2023-07-16: 25 ug via INTRAVENOUS

## 2023-07-16 MED ORDER — MIDAZOLAM HCL 2 MG/2ML IJ SOLN
INTRAMUSCULAR | Status: AC
  Filled 2023-07-16: qty 2

## 2023-07-16 MED ORDER — INSULIN GLARGINE-YFGN 100 UNIT/ML ~~LOC~~ SOLN
15.0000 [IU] | Freq: Every day | SUBCUTANEOUS | Status: DC
  Administered 2023-07-16 – 2023-07-20 (×5): 15 [IU] via SUBCUTANEOUS
  Filled 2023-07-16 (×5): qty 0.15

## 2023-07-16 MED ORDER — FENTANYL CITRATE (PF) 100 MCG/2ML IJ SOLN: 25.0000 ug | INTRAMUSCULAR | Status: DC | PRN

## 2023-07-16 MED ORDER — PROPOFOL 500 MG/50ML IV EMUL
INTRAVENOUS | Status: DC | PRN
  Administered 2023-07-16: 75 ug/kg/min via INTRAVENOUS

## 2023-07-16 MED ORDER — SODIUM CHLORIDE 0.9 % IV SOLN: INTRAVENOUS | Status: DC | PRN

## 2023-07-16 MED ORDER — LIDOCAINE HCL (PF) 2 % IJ SOLN
INTRAMUSCULAR | Status: DC | PRN
  Administered 2023-07-16: 40 mg via INTRADERMAL

## 2023-07-16 MED ORDER — LIDOCAINE HCL (PF) 2 % IJ SOLN
INTRAMUSCULAR | Status: AC
  Filled 2023-07-16: qty 5

## 2023-07-16 MED ORDER — MIDAZOLAM HCL 2 MG/2ML IJ SOLN
INTRAMUSCULAR | Status: DC | PRN
  Administered 2023-07-16: 2 mg via INTRAVENOUS

## 2023-07-16 MED ORDER — PROPOFOL 1000 MG/100ML IV EMUL
INTRAVENOUS | Status: AC
  Filled 2023-07-16: qty 100

## 2023-07-16 MED ORDER — 0.9 % SODIUM CHLORIDE (POUR BTL) OPTIME
TOPICAL | Status: DC | PRN
  Administered 2023-07-16: 500 mL

## 2023-07-16 MED ORDER — PROPOFOL 10 MG/ML IV BOLUS
INTRAVENOUS | Status: DC | PRN
  Administered 2023-07-16: 30 mg via INTRAVENOUS

## 2023-07-16 SURGICAL SUPPLY — 31 items
BNDG COHESIVE 6X5 TAN ST LF (GAUZE/BANDAGES/DRESSINGS) ×1 IMPLANT
CANISTER WOUND CARE 500ML ATS (WOUND CARE) IMPLANT
CHLORAPREP W/TINT 26 (MISCELLANEOUS) ×1 IMPLANT
DRAPE EXTREMITY 106X87X128.5 (DRAPES) IMPLANT
DRAPE FOR WOUND VAC UNIT (DRAPES) IMPLANT
DRAPE INCISE IOBAN 66X45 STRL (DRAPES) IMPLANT
DRAPE LAPAROTOMY 100X77 ABD (DRAPES) IMPLANT
DRSG EMULSION OIL 3X3 NADH (GAUZE/BANDAGES/DRESSINGS) IMPLANT
DRSG VAC GRANUFOAM LG (GAUZE/BANDAGES/DRESSINGS) IMPLANT
DRSG VAC GRANUFOAM MED (GAUZE/BANDAGES/DRESSINGS) IMPLANT
ELECT REM PT RETURN 9FT ADLT (ELECTROSURGICAL) ×1
ELECTRODE REM PT RTRN 9FT ADLT (ELECTROSURGICAL) ×1 IMPLANT
GAUZE SPONGE 4X4 12PLY STRL (GAUZE/BANDAGES/DRESSINGS) IMPLANT
GAUZE STRETCH 2X75IN STRL (MISCELLANEOUS) IMPLANT
GLOVE BIO SURGEON STRL SZ7 (GLOVE) ×1 IMPLANT
GLOVE SURG SYN 8.0 (GLOVE) ×1
GLOVE SURG SYN 8.0 PF PI (GLOVE) ×1 IMPLANT
GOWN STRL REUS W/ TWL LRG LVL3 (GOWN DISPOSABLE) ×2 IMPLANT
GOWN STRL REUS W/ TWL XL LVL3 (GOWN DISPOSABLE) ×1 IMPLANT
KIT TURNOVER KIT A (KITS) ×1 IMPLANT
LABEL OR SOLS (LABEL) ×1 IMPLANT
MANIFOLD NEPTUNE II (INSTRUMENTS) ×1 IMPLANT
NS IRRIG 500ML POUR BTL (IV SOLUTION) ×1 IMPLANT
PACK EXTREMITY ARMC (MISCELLANEOUS) ×1 IMPLANT
PAD NEG PRESSURE SENSATRAC (MISCELLANEOUS) IMPLANT
PAD PREP OB/GYN DISP 24X41 (PERSONAL CARE ITEMS) ×1 IMPLANT
SOL PREP PVP 2OZ (MISCELLANEOUS) ×1
SOLUTION PREP PVP 2OZ (MISCELLANEOUS) ×1 IMPLANT
STOCKINETTE IMPERV 14X48 (MISCELLANEOUS) ×1 IMPLANT
TRAP FLUID SMOKE EVACUATOR (MISCELLANEOUS) ×1 IMPLANT
WATER STERILE IRR 500ML POUR (IV SOLUTION) ×1 IMPLANT

## 2023-07-16 NOTE — Anesthesia Postprocedure Evaluation (Signed)
 Anesthesia Post Note  Patient: Laura Reed  Procedure(s) Performed: WOUND VAC EXCHANGE LEFT GROIN (Left: Groin)  Patient location during evaluation: PACU Anesthesia Type: General Level of consciousness: awake and alert Pain management: pain level controlled Vital Signs Assessment: post-procedure vital signs reviewed and stable Respiratory status: spontaneous breathing, nonlabored ventilation, respiratory function stable and patient connected to nasal cannula oxygen Cardiovascular status: blood pressure returned to baseline and stable Postop Assessment: no apparent nausea or vomiting Anesthetic complications: no   No notable events documented.   Last Vitals:  Vitals:   07/16/23 1545 07/16/23 1620  BP: (!) 152/53 135/66  Pulse: 65 69  Resp: 10 17  Temp:  36.6 C  SpO2: 97% 94%    Last Pain:  Vitals:   07/16/23 1545  TempSrc:   PainSc: 0-No pain                 [name]

## 2023-07-16 NOTE — Anesthesia Preprocedure Evaluation (Signed)
 Anesthesia Evaluation  Patient identified by MRN, date of birth, ID band Patient awake    Reviewed: Allergy & Precautions, NPO status , Patient's Chart, lab work & pertinent test results  History of Anesthesia Complications Negative for: history of anesthetic complications  Airway Mallampati: III  TM Distance: >3 FB Neck ROM: full    Dental no notable dental hx. (+) Edentulous Upper, Edentulous Lower   Pulmonary neg COPD, neg recent URI, Patient abstained from smoking., former smoker   Pulmonary exam normal breath sounds clear to auscultation       Cardiovascular hypertension, On Medications (-) angina + CAD, + CABG, + Peripheral Vascular Disease and +CHF  (-) Cardiac Stents + dysrhythmias (rate controlled) Atrial Fibrillation + Valvular Problems/Murmurs  Rhythm:Regular Rate:Normal - Systolic murmurs    Neuro/Psych neg Seizures PSYCHIATRIC DISORDERS Anxiety      Neuromuscular disease    GI/Hepatic Neg liver ROS,GERD  Controlled,,  Endo/Other  diabetes, Insulin  Dependent    Renal/GU      Musculoskeletal   Abdominal   Peds  Hematology negative hematology ROS (+)   Anesthesia Other Findings Past Medical History: No date: Absence of kidney     Comment:  left No date: Anxiety No date: Arthritis No date: Bladder cancer (HCC) No date: CHF (congestive heart failure) (HCC) No date: Complication of anesthesia     Comment:  BP HAS  RUN LOW AFTER SURGERY-LUNGS FILLED UP WITH FLUID              AFTER  LEG STENT SURGERY  No date: Coronary artery disease No date: Diabetes mellitus No date: Family history of adverse reaction to anesthesia     Comment:  Sister - PONV No date: GERD (gastroesophageal reflux disease)     Comment:  OCC TUMS No date: Heart murmur No date: Hemorrhoid 2007: History of methicillin resistant staphylococcus aureus (MRSA) No date: Hypertension No date: Neuropathy No date: PVD (peripheral vascular  disease) (HCC) No date: Thyroid  nodule     Comment:  right 10/31/2014: Urothelial carcinoma of kidney (HCC)     Comment:  INVASIVE UROTHELIAL CARCINOMA, LOW GRADE. T1, Nx. No date: Wears dentures     Comment:  full upper and lower  Past Surgical History: No date: AMPUTATION TOE     Comment:  right (4th and 5th); left (great toe, 3rd) 07/16/2018: AMPUTATION TOE; Right     Comment:  Procedure: AMPUTATION TOE/MPJ right 2nd;  Surgeon:               Neill Boas, DPM;  Location: [facility];  Service:               Podiatry;  Laterality: Right; 2009, 2013 x 2: ARTERIAL BYPASS SURGRY     Comment:  right leg , done in [place] No date: CARDIAC CATHETERIZATION 01/2014: CAROTID ENDARTERECTOMY; Right     Comment:  Dr Jama 12/14/2014: CATARACT EXTRACTION W/PHACO; Right     Comment:  Procedure: CATARACT EXTRACTION PHACO AND INTRAOCULAR               LENS PLACEMENT (IOC);  Surgeon: Newell Ovens, MD;                Location: [facility];  Service: Ophthalmology;  Laterality:              Right;  US    00:38.6              AP        7.1  CDE  2.76 12/06/2019: CATARACT EXTRACTION W/PHACO; Left     Comment:  Procedure: CATARACT EXTRACTION PHACO AND INTRAOCULAR               LENS PLACEMENT (IOC) LEFT DIABETIC;  Surgeon: Jaye Fallow, MD;  Location: [facility];  Service:               Ophthalmology;  Laterality: Left;  9.08 1:06.4 No date: CESAREAN SECTION 03-03-12: CHOLECYSTECTOMY     Comment:  Porcelain gallbladder, gallstones,  [place] 04/28/2012: COLONOSCOPY W/ BIOPSIES     Comment:  Hyperplastic rectal polyps. 04/02/2022: COLONOSCOPY WITH PROPOFOL ; N/A     Comment:  Procedure: COLONOSCOPY WITH PROPOFOL ;  Surgeon: Dessa Reyes ORN, MD;  Location: [facility];  Service:               Endoscopy;  Laterality: N/A; 2009: CORONARY ARTERY BYPASS GRAFT     Comment:  3 vessel 09/01/2016: CYSTOSCOPY W/ RETROGRADES; Right     Comment:   Procedure: CYSTOSCOPY WITH RETROGRADE PYELOGRAM;                Surgeon: Rosina Riis, MD;  Location: [facility];                Service: Urology;  Laterality: Right; 03/19/2020: CYSTOSCOPY W/ RETROGRADES; Bilateral     Comment:  Procedure: CYSTOSCOPY WITH RETROGRADE PYELOGRAM;                Surgeon: Riis Rosina, MD;  Location: [facility];                Service: Urology;  Laterality: Bilateral; 03/19/2020: CYSTOSCOPY WITH BIOPSY; N/A     Comment:  Procedure: CYSTOSCOPY WITH BIOPSY;  Surgeon: Riis Rosina, MD;  Location: [facility];  Service: Urology;                Laterality: N/A; No date: EYE SURGERY 10-31-14: HERNIA REPAIR     Comment:  ventral, retro-rectus atrium mesh 01/18/2019: IRRIGATION AND DEBRIDEMENT FOOT; Left     Comment:  Procedure: IRRIGATION AND DEBRIDEMENT FOOT;  Surgeon:               Neill Boas, DPM;  Location: [facility];  Service:               Podiatry;  Laterality: Left; 12/10/2016: LOWER EXTREMITY ANGIOGRAPHY; Left     Comment:  Procedure: Lower Extremity Angiography;  Surgeon:               Jama Cordella MATSU, MD;  Location: [facility];               Service: Cardiovascular;  Laterality: Left; 02/02/2018: LOWER EXTREMITY ANGIOGRAPHY; Left     Comment:  Procedure: LOWER EXTREMITY ANGIOGRAPHY;  Surgeon:               Jama Cordella MATSU, MD;  Location: [facility];               Service: Cardiovascular;  Laterality: Left; 05/05/2018: LOWER EXTREMITY ANGIOGRAPHY; Left     Comment:  Procedure: LOWER EXTREMITY ANGIOGRAPHY;  Surgeon:               Jama Cordella MATSU, MD;  Location: [facility]  CV LAB;               Service: Cardiovascular;  Laterality: Left; 12/04/2020: LOWER EXTREMITY ANGIOGRAPHY; Left     Comment:  Procedure: LOWER EXTREMITY ANGIOGRAPHY with               Intervention;  Surgeon: Jama Cordella MATSU, MD;                Location: [facility];  Service: Cardiovascular;              Laterality:  Left; 04/24/2023: LOWER EXTREMITY ANGIOGRAPHY; Left     Comment:  Procedure: Lower Extremity Angiography;  Surgeon:               Jama Cordella MATSU, MD;  Location: [facility];               Service: Cardiovascular;  Laterality: Left; 10-31-14: NEPHRECTOMY; Left 05/01/2015: PERIPHERAL VASCULAR CATHETERIZATION; Left     Comment:  Procedure: Lower Extremity Angiography;  Surgeon:               Cordella MATSU Jama, MD;  Location: [facility];                Service: Cardiovascular;  Laterality: Left; 05/01/2015: PERIPHERAL VASCULAR CATHETERIZATION     Comment:  Procedure: Lower Extremity Intervention;  Surgeon:               Cordella MATSU Jama, MD;  Location: [facility];                Service: Cardiovascular;; 02/20/2015: PERIPHERAL VASCULAR CATHETERIZATION; Left     Comment:  Procedure: Pelvic Angiography;  Surgeon: Cordella MATSU Jama, MD;  Location: [facility];  Service:               Cardiovascular;  Laterality: Left; 09/01/2016: TRANSURETHRAL RESECTION OF BLADDER TUMOR WITH MITOMYCIN -C;  N/A     Comment:  Procedure: TRANSURETHRAL RESECTION OF BLADDER TUMOR WITH              MITOMYCIN -C;  Surgeon: Rosina Riis, MD;  Location:               [facility];  Service: Urology;  Laterality: N/A;  BMI    Body Mass Index: 21.13 kg/m      Reproductive/Obstetrics negative OB ROS                              Anesthesia Physical Anesthesia Plan  ASA: 3  Anesthesia Plan: General   Post-op Pain Management: Minimal or no pain anticipated   Induction: Intravenous  PONV Risk Score and Plan: 3 and Ondansetron , Propofol  infusion, TIVA and Midazolam   Airway Management Planned: Natural Airway and Nasal Cannula  Additional Equipment:   Intra-op Plan:   Post-operative Plan:   Informed Consent: I have reviewed the patients History and Physical, chart, labs and discussed the procedure including the risks, benefits and  alternatives for the proposed anesthesia with the patient or authorized representative who has indicated his/her understanding and acceptance.     Dental Advisory Given  Plan Discussed with: Anesthesiologist, CRNA and Surgeon  Anesthesia Plan Comments: (Patient consented for risks of anesthesia including but not limited to:  - adverse reactions to medications - risk of airway placement if required - damage to eyes,  teeth, lips or other oral mucosa - nerve damage due to positioning  - sore throat or hoarseness - Damage to heart, brain, nerves, lungs, other parts of body or loss of life  Patient voiced understanding and assent.)        Anesthesia Quick Evaluation

## 2023-07-16 NOTE — Op Note (Signed)
    OPERATIVE NOTE   PROCEDURE: 1.  Application of VAC dressing  PRE-OPERATIVE DIAGNOSIS: Left groin wound status post drainage of abscess  POST-OPERATIVE DIAGNOSIS: Same  SURGEON: Cordella Shawl  ASSISTANT(S): Gwendlyn Shank, NP  ANESTHESIA: MAC  ESTIMATED BLOOD LOSS: 0 cc  FINDING(S): Healthy granulation tissue.  Kerecis appears to be assisting in granulation formation.  Medially the graft remains exposed  SPECIMEN(S): None  INDICATIONS:   Laura Reed is a 63 y.o. female who presents with left groin wound.  DESCRIPTION: After full informed written consent was obtained from the patient, the patient was brought back to the operating room and placed supine upon the operating table.  Prior to induction, the patient received IV antibiotics.   After obtaining adequate anesthesia, the patient was then prepped and draped in the standard fashion for a VAC dressing change left groin with possible debridement.  The existing VAC dressing is removed.  This exposes the wound which appears healthy granulating there is no purulence noted there is no significant slime or fluid buildup.  The graft is still easily palpable in the medial aspect.  Kerecis is present but appears to be functioning as a scaffold and does not appear to be infected.  Overall the wound looks quite good and no debridement is indicated at this time.  Measurements were made and the wound is now 13 cm in length by 4 cm in width by 2 cm in depth.  This represents 52 cm of surface area.  VAC dressing is placed with good seal in the operating room.   The patient tolerated this procedure well.   COMPLICATIONS: None  CONDITION: Laura Reed Cordella Shawl [facility]  Office: [phone]   07/16/2023, 3:16 PM

## 2023-07-16 NOTE — Anesthesia Procedure Notes (Signed)
 Procedure Name: MAC Date/Time: 07/16/2023 2:45 PM  Performed by: Lacretia Camelia NOVAK, CRNAPre-anesthesia Checklist: Patient identified, Emergency Drugs available, Suction available and Patient being monitored Patient Re-evaluated:Patient Re-evaluated prior to induction Oxygen Delivery Method: Simple face mask

## 2023-07-16 NOTE — Progress Notes (Signed)
 Physical Therapy Treatment Patient Details Name: BRYLIN STOPPER MRN: [id number] DOB: 06/16/1961 Today's Date: 07/16/2023   History of Present Illness MICHELENE KENISTON  has presented today for surgery, with the diagnosis of Groin Infection. S/P I & D left groin. On April 29, 2023 she underwent a femoral to tibioperoneal trunk bypass using a PTFE distal flow graft.  She was noted to thrombose within the first 48 hours and subsequently underwent angiography with salvage of her bypass and stenting of the anterior tibial artery.  Following the second procedure which was May 01, 2023 she did well and was discharged to home approximately 10 days later. Wound vac change 12/23.  S/p 07/06/23 wound VAC washout and change.  S/p 07/09/23 I&D of skin, soft tissue, and muscle (for approximately 75 cm2) to L groin; application of Kerecis biologic to wound to L groin; and placement of nondisposable negative pressure dressing.    PT Comments  Pt was long sitting in bed upon arrival. She is scheduled for wound vac change later this date. Agreeable to session. Did not want to use AD when OOB however is reliant of hallway railing. Recommend use of SPC in future sessions/ at DC. Overall pt tolerates session well and is progressing towards PT goals.     If plan is discharge home, recommend the following: A little help with walking and/or transfers;A little help with bathing/dressing/bathroom;Assistance with cooking/housework;Help with stairs or ramp for entrance;Assist for transportation     Equipment Recommendations  Rolling walker (2 wheels)       Precautions / Restrictions Precautions Precautions: Fall Precaution Comments: wound vac Restrictions Weight Bearing Restrictions Per Provider Order: No     Mobility  Bed Mobility Overal bed mobility: Modified Independent   Transfers Overall transfer level: Modified independent Equipment used: None Transfers: Sit to/from Stand Sit to Stand: Supervision    Ambulation/Gait Ambulation/Gait assistance: Supervision, Contact guard assist Gait Distance (Feet): 200 Feet Assistive device: None (hallway railing) Gait Pattern/deviations: Step-through pattern Gait velocity: decreased  General Gait Details: Pt ambulated 200 ft without AD with mostly +1 UE support on hallway rails. Supervision with pt holding hallway rails however CGA for ambulation without UE support. recommend use of SPC at all times     Balance Overall balance assessment: Needs assistance Sitting-balance support: Feet supported, No upper extremity supported Sitting balance-Leahy Scale: Good     Standing balance support: No upper extremity supported, During functional activity Standing balance-Leahy Scale: Good       Cognition Arousal: Alert Behavior During Therapy: WFL for tasks assessed/performed Overall Cognitive Status: Within Functional Limits for tasks assessed      General Comments: Pt is A and O x 4. Cooperative and pleasant               Pertinent Vitals/Pain Pain Assessment Pain Assessment: 0-10 Pain Score: 3  Pain Location: L groin Pain Descriptors / Indicators: Discomfort, Sore Pain Intervention(s): Limited activity within patient's tolerance, Monitored during session, Premedicated before session, Repositioned     PT Goals (current goals can now be found in the care plan section) Acute Rehab PT Goals Patient Stated Goal: to return home Progress towards PT goals: Progressing toward goals    Frequency    Min 1X/week       AM-PAC PT 6 Clicks Mobility   Outcome Measure  Help needed turning from your back to your side while in a flat bed without using bedrails?: None Help needed moving from lying on your back to sitting on  the side of a flat bed without using bedrails?: None Help needed moving to and from a bed to a chair (including a wheelchair)?: None Help needed standing up from a chair using your arms (e.g., wheelchair or bedside  chair)?: None Help needed to walk in hospital room?: A Little Help needed climbing 3-5 steps with a railing? : A Little 6 Click Score: 22    End of Session   Activity Tolerance: Patient tolerated treatment well Patient left: Other (comment) (pt was sitting EOB with bed alarm in place and call bell in reach.) Nurse Communication: Mobility status;Precautions PT Visit Diagnosis: Other abnormalities of gait and mobility (R26.89);Unsteadiness on feet (R26.81);Pain;Difficulty in walking, not elsewhere classified (R26.2);Muscle weakness (generalized) (M62.81) Pain - Right/Left: Left Pain - part of body: Hip     Time: 9062-9048 PT Time Calculation (min) (ACUTE ONLY): 14 min  Charges:    $Gait Training: 8-22 mins PT General Charges $$ ACUTE PT VISIT: 1 Visit                    Rankin Essex PTA 07/16/23, 12:12 PM

## 2023-07-16 NOTE — Interval H&P Note (Signed)
 History and Physical Interval Note:  07/16/2023 2:49 PM  Laura Reed  has presented today for surgery, with the diagnosis of left groin infection.  The various methods of treatment have been discussed with the patient and family. After consideration of risks, benefits and other options for treatment, the patient has consented to  Procedure(s): WOUND VAC EXCHANGE LEFT GROIN (Left) as a surgical intervention.  The patient's history has been reviewed, patient examined, no change in status, stable for surgery.  I have reviewed the patient's chart and labs.  Questions were answered to the patient's satisfaction.     Cordella Shawl

## 2023-07-16 NOTE — Transfer of Care (Signed)
 Immediate Anesthesia Transfer of Care Note  Patient: Laura Reed  Procedure(s) Performed: WOUND VAC EXCHANGE LEFT GROIN (Left: Groin)  Patient Location: PACU  Anesthesia Type:General  Level of Consciousness: awake, drowsy, and patient cooperative  Airway & Oxygen Therapy: Patient Spontanous Breathing and Patient connected to face mask oxygen  Post-op Assessment: Report given to RN and Post -op Vital signs reviewed and stable  Post vital signs: Reviewed and stable  Last Vitals:  Vitals Value Taken Time  BP 152/53 07/16/23 1545  Temp 36.4 C 07/16/23 1527  Pulse 64 07/16/23 1551  Resp 13 07/16/23 1551  SpO2 99 % 07/16/23 1551  Vitals shown include unfiled device data.  Last Pain:  Vitals:   07/16/23 1545  TempSrc:   PainSc: 0-No pain      Patients Stated Pain Goal: 0 (07/15/23 2115)  Complications: No notable events documented.

## 2023-07-16 NOTE — Plan of Care (Signed)
  Problem: Coping: Goal: Ability to adjust to condition or change in health will improve Outcome: Progressing   Problem: Fluid Volume: Goal: Ability to maintain a balanced intake and output will improve Outcome: Progressing   Problem: Health Behavior/Discharge Planning: Goal: Ability to manage health-related needs will improve Outcome: Progressing   Problem: Nutritional: Goal: Maintenance of adequate nutrition will improve Outcome: Progressing   Problem: Education: Goal: Knowledge of General Education information will improve Description: Including pain rating scale, medication(s)/side effects and non-pharmacologic comfort measures Outcome: Progressing   Problem: Health Behavior/Discharge Planning: Goal: Ability to manage health-related needs will improve Outcome: Progressing   Problem: Clinical Measurements: Goal: Ability to maintain clinical measurements within normal limits will improve Outcome: Progressing

## 2023-07-16 NOTE — Plan of Care (Signed)
  Problem: Education: Goal: Ability to describe self-care measures that may prevent or decrease complications (Diabetes Survival Skills Education) will improve Outcome: Progressing   Problem: Skin Integrity: Goal: Risk for impaired skin integrity will decrease Outcome: Progressing   Problem: Pain Management: Goal: General experience of comfort will improve Outcome: Progressing   Problem: Safety: Goal: Ability to remain free from injury will improve Outcome: Progressing   Problem: Skin Integrity: Goal: Risk for impaired skin integrity will decrease Outcome: Progressing

## 2023-07-16 NOTE — Progress Notes (Signed)
 Progress Note   Patient: Laura Reed FMW:[id number] DOB: Jul 21, 1960 DOA: 06/21/2023     25 DOS: the patient was seen and examined on 07/16/2023      Brief hospital course:  HPI: 63 y.o female with significant PMH of HTN, HLD, DM, sCHF, CAD, PAD (s/p of left common femoral-distal bypass, thrombectomy, angioplasty and stent placement 04/2023 on aspirin , Plavix  and Eliquis ), anxiety, bladder cancer (s/p of nephrectomy). [facility] on 06/21/23 with complaints of right groin pain, redness and oozing post op. Patient has hx of PAD and recently underwent left common femoral-distal bypass, thrombectomy, angioplasty and stent placement 04/2023 and right transmetatarsal amputation. SBP in the 50s and was sent to the ED for further evaluation.  Patient was initially admitted to hospitalist service later transferred to ICU for pressor support.  Patient has undergone multiple trips to the OR for wound debridement and wound VAC exchange.  1/3: Hemodynamically stable.  Going back to the OR on Monday.  Patient will need need wound VAC for many weeks. TOC having issues finding and home health agency.  1/4: Had an episode of hypoglycemia earlier this morning.  Requiring intervention.  Semglee  dose was decreased to 10 from 15 at night, also making sensitive SSI.  1/5: CBG elevated this morning so increasing the dose of Semglee  to 12 units.  Going for another wound VAC change with vascular surgery tomorrow, n.p.o. after midnight.  1/6: Patient had another wound VAC exchange and further debridement in OR with vascular surgery today.  1/7: Remained hemodynamically stable, going back to the OR on 07/16/2023  1/8: Hemodynamically stable, going back to the OR tomorrow  1/9: Remained stable, going back to the OR with vascular today     Consultants:  [facility] Vascular Surgery  Infectious disease    Procedures/Surgeries: 06/24/23: Excisional debridement of left groin abscess. Rotation of a sartorius muscle  flap for coverage of the existing bypass grafts. Placement of a nondisposable wound VAC dressing left groin. Redo vascular surgery - Drs. [name] and Dew 06/26/23: Excisional debridement left groin wound. Placement of a nondisposable VAC dressing - Dr Jama 06/29/23: Left groin VAC dressing change - Dr Jama   07/02/23: Irrigation and debridement of skin, soft tissue, and muscle for approximately 125 cm to the left groin wound, Pulse lavage irrigation left groin. Left groin VAC dressing change - Dr Marea      Going back to OR today  ASSESSMENT & PLAN:   Left groin abscess Sepsis with septic shock resolved Status post I&D with vascular surgery 12/18 with takeback to the OR for left groin debridement, muscle flap, wound VAC exchange 12/20, wound vac exchange 12/23 ID on board and has recommended antibiotics with end date on 07/22/2023 Pain control Patient underwent wound VAC exchange on 07/06/2023 followed by 07/12/2024, getting another 1 today Follow vascular surgery recommendations   PAD s/p of left common femoral-tibioperoneal trunk bypass, thrombectomy, angioplasty and stent placement 04/2023  on aspirin , Plavix  and Eliquis   Continue eliquis  and aspirin  Continue statin therapy Continue aspirin    Leukocytosis-resolved Continue current antibiotics   Normocytic anemia Expected ABLA d/t surgeries normal hemoglobin prior to vascular procedures.   some blood tinge output in her wound VAC S/p 1 unit of packed red blood cells by vascular team.  Hemoglobin at 7.8 today -Starting on supplement -Continue to monitor -Transfuse below 7  AKI - resolved Likely d/t septic shock resolved Continue to monitor renal function   Hypokalemia D/t Lasix   Replace as needed Monitor BMP  PVC Patient noted to have frequent PVCs on tele This is likely causing her heart rate to appear to be lower than normal.  Will ensure potassium >4, Magnesium >2 Continue to monitor   Hyponatremia   Resolved Monitor sodium level   Chronic diastolic HF Edema bilateral LE Monitor fluid status  Diuresis prn  Monitor BMP w/ diuresis   Hypoalbuminemia  Encourage po intake    HTN  HLD Continue statin Hold home antihypertensives. Mostly normotensive off of midodrine  Continue to monitor blood pressure   T2DM  A1c 8, CBG elevated this morning after decreasing the dose of Semglee  Increase Semglee  to 12 units at night Continue mealtime 3 units  daily Continue with sensitive SSI       DVT prophylaxis: ELiquis     Central lines / invasive devices: wound vac    Code Status: FULL CODE   TOC needs: home health  Barriers to dispo / significant pending items: wound vac in place, iv abx, planning wound vac change in OR      Subjective Patient was seen and examined.  No new concern.  Awaiting her procedure today.   Family Communication:      Physical Exam General.  Well-developed lady, in no acute distress. Pulmonary.  Lungs clear bilaterally, normal respiratory effort. CV.  Regular rate and rhythm, no JVD, rub or murmur. Abdomen.  Soft, nontender, nondistended, BS positive. CNS.  Alert and oriented .  No focal neurologic deficit. Extremities.  No edema on right, 1+ on the left lower extremity and wound VAC on left groin Psychiatry.  Judgment and insight appears normal.      Data Reviewed:     Latest Ref Rng & Units 07/10/2023    5:06 AM 07/09/2023    5:00 AM 07/08/2023    4:46 AM  BMP  Glucose 70 - 99 mg/dL 54  867  859   BUN 8 - 23 mg/dL 16  17  16    Creatinine 0.44 - 1.00 mg/dL 9.36  9.40  9.31   Sodium 135 - 145 mmol/L 137  136  136   Potassium 3.5 - 5.1 mmol/L 3.5  3.5  3.9   Chloride 98 - 111 mmol/L 102  102  100   CO2 22 - 32 mmol/L 27  24  29    Calcium  8.9 - 10.3 mg/dL 7.7  7.9  8.3     Vitals:   07/15/23 2117 07/16/23 0500 07/16/23 0751 07/16/23 1350  BP: (!) 158/55  (!) 141/45 (!) 153/68  Pulse: 74  69 71  Resp: 20  16 16   Temp: 97.6 F (36.4 C)  97.7  F (36.5 C) 98.2 F (36.8 C)  TempSrc: Oral   Oral  SpO2: 94%  92% 93%  Weight:  69.4 kg  69.4 kg  Height:    5' 7 (1.702 m)      Latest Ref Rng & Units 07/10/2023    5:06 AM 07/09/2023    5:00 AM 07/08/2023    4:46 AM  CBC  WBC 4.0 - 10.5 K/uL 8.7  9.0  8.9   Hemoglobin 12.0 - 15.0 g/dL 7.8  7.7  8.5   Hematocrit 36.0 - 46.0 % 23.2  22.6  25.8   Platelets 150 - 400 K/uL 338  323  354      Author: Amaryllis Dare, MD 07/16/2023 2:08 PM  For on call review [email or web address].

## 2023-07-17 ENCOUNTER — Encounter: Payer: Self-pay | Admitting: Vascular Surgery

## 2023-07-17 DIAGNOSIS — M79605 Pain in left leg: Secondary | ICD-10-CM | POA: Diagnosis not present

## 2023-07-17 DIAGNOSIS — I739 Peripheral vascular disease, unspecified: Secondary | ICD-10-CM | POA: Diagnosis not present

## 2023-07-17 DIAGNOSIS — D72829 Elevated white blood cell count, unspecified: Secondary | ICD-10-CM | POA: Diagnosis not present

## 2023-07-17 DIAGNOSIS — N179 Acute kidney failure, unspecified: Secondary | ICD-10-CM | POA: Diagnosis not present

## 2023-07-17 LAB — BASIC METABOLIC PANEL
Anion gap: 10 (ref 5–15)
BUN: 16 mg/dL (ref 8–23)
CO2: 26 mmol/L (ref 22–32)
Calcium: 8.2 mg/dL — ABNORMAL LOW (ref 8.9–10.3)
Chloride: 100 mmol/L (ref 98–111)
Creatinine, Ser: 0.73 mg/dL (ref 0.44–1.00)
GFR, Estimated: >60 mL/min (ref >60)
Glucose, Bld: 122 mg/dL — ABNORMAL HIGH (ref 70–99)
Potassium: 3.4 mmol/L — ABNORMAL LOW (ref 3.5–5.1)
Sodium: 136 mmol/L (ref 135–145)

## 2023-07-17 LAB — GLUCOSE, CAPILLARY
Glucose-Capillary: 126 mg/dL — ABNORMAL HIGH (ref 70–99)
Glucose-Capillary: 112 mg/dL — ABNORMAL HIGH (ref 70–99)
Glucose-Capillary: 104 mg/dL — ABNORMAL HIGH (ref 70–99)
Glucose-Capillary: 156 mg/dL — ABNORMAL HIGH (ref 70–99)

## 2023-07-17 LAB — CBC
HCT: 28.3 % — ABNORMAL LOW (ref 36.0–46.0)
Hemoglobin: 9.1 g/dL — ABNORMAL LOW (ref 12.0–15.0)
MCH: 29.1 pg (ref 26.0–34.0)
MCHC: 32.2 g/dL (ref 30.0–36.0)
MCV: 90.4 fL (ref 80.0–100.0)
Platelets: 503 10*3/uL — ABNORMAL HIGH (ref 150–400)
RBC: 3.13 MIL/uL — ABNORMAL LOW (ref 3.87–5.11)
RDW: 15 % (ref 11.5–15.5)
WBC: 8.8 10*3/uL (ref 4.0–10.5)
nRBC: 0 % (ref 0.0–0.2)

## 2023-07-17 MED ORDER — POTASSIUM CHLORIDE CRYS ER 20 MEQ PO TBCR
40.0000 meq | EXTENDED_RELEASE_TABLET | Freq: Once | ORAL | Status: AC
  Administered 2023-07-17: 40 meq via ORAL
  Filled 2023-07-17: qty 2

## 2023-07-17 NOTE — Progress Notes (Signed)
 [facility]  Daily Progress Note   Subjective  - 1 Day Post-Op  No pain Walking with PT doing very well  Objective Vitals:   07/16/23 1620 07/16/23 2348 07/17/23 0805 07/17/23 1647  BP: 135/66 (!) 110/35 (!) 148/61 (!) 143/48  Pulse: 69 63 66 71  Resp: 17 17 16 16   Temp: 97.8 F (36.6 C)  97.9 F (36.6 C) 98.6 F (37 C)  TempSrc:   Oral Oral  SpO2: 94% 92% 92% 98%  Weight:      Height:        Intake/Output Summary (Last 24 hours) at 07/17/2023 1743 Last data filed at 07/17/2023 1537 Gross per 24 hour  Intake 1354.74 ml  Output 50 ml  Net 1304.74 ml    PULM  Normal effort , no use of accessory muscles CV  No JVD, RRR Abd      No distended, nontender VASC  Vac intact  Laboratory CBC    Component Value Date/Time   WBC 8.8 07/17/2023 0500   HGB 9.1 (L) 07/17/2023 0500   HGB 9.5 (L) 11/02/2014 0609   HCT 28.3 (L) 07/17/2023 0500   HCT 29.5 (L) 11/02/2014 0609   PLT 503 (H) 07/17/2023 0500   PLT 217 11/02/2014 0609    BMET    Component Value Date/Time   NA 136 07/17/2023 0500   NA 136 08/02/2021 1629   NA 135 11/02/2014 0609   K 3.4 (L) 07/17/2023 0500   K 4.2 11/02/2014 0609   CL 100 07/17/2023 0500   CL 107 11/02/2014 0609   CO2 26 07/17/2023 0500   CO2 23 11/02/2014 0609   GLUCOSE 122 (H) 07/17/2023 0500   GLUCOSE 108 (H) 11/02/2014 0609   BUN 16 07/17/2023 0500   BUN 25 08/02/2021 1629   BUN 20 11/02/2014 0609   CREATININE 0.73 07/17/2023 0500   CREATININE 1.01 11/09/2015 1549   CREATININE 1.01 11/09/2015 1549   CALCIUM  8.2 (L) 07/17/2023 0500   CALCIUM  7.3 (L) 11/02/2014 0609   GFRNONAA >60 07/17/2023 0500   GFRNONAA 50 (L) 11/02/2014 0609   GFRAA >60 01/19/2019 0357   GFRAA 58 (L) 11/02/2014 0609    Assessment/Planning:   Improving  will plan VAC dressing change Monday   Cordella Shawl  07/17/2023, 5:43 PM

## 2023-07-17 NOTE — Progress Notes (Signed)
 Progress Note   Patient: Laura Reed FMW:[id number] DOB: 1960-09-19 DOA: 06/21/2023     26 DOS: the patient was seen and examined on 07/17/2023      Brief hospital course:  HPI: 63 y.o female with significant PMH of HTN, HLD, DM, sCHF, CAD, PAD (s/p of left common femoral-distal bypass, thrombectomy, angioplasty and stent placement 04/2023 on aspirin , Plavix  and Eliquis ), anxiety, bladder cancer (s/p of nephrectomy). [facility] on 06/21/23 with complaints of right groin pain, redness and oozing post op. Patient has hx of PAD and recently underwent left common femoral-distal bypass, thrombectomy, angioplasty and stent placement 04/2023 and right transmetatarsal amputation. SBP in the 50s and was sent to the ED for further evaluation.  Patient was initially admitted to hospitalist service later transferred to ICU for pressor support.  Patient has undergone multiple trips to the OR for wound debridement and wound VAC exchange.  1/3: Hemodynamically stable.  Going back to the OR on Monday.  Patient will need need wound VAC for many weeks. TOC having issues finding and home health agency.  1/4: Had an episode of hypoglycemia earlier this morning.  Requiring intervention.  Semglee  dose was decreased to 10 from 15 at night, also making sensitive SSI.  1/5: CBG elevated this morning so increasing the dose of Semglee  to 12 units.  Going for another wound VAC change with vascular surgery tomorrow, n.p.o. after midnight.  1/6: Patient had another wound VAC exchange and further debridement in OR with vascular surgery today.  1/7: Remained hemodynamically stable, going back to the OR on 07/16/2023  1/8: Hemodynamically stable, going back to the OR tomorrow  1/9: Remained stable, going back to the OR with vascular today  1/10: Hemodynamically stable.  Going back to the OR on Monday     Consultants:  [facility] Vascular Surgery  Infectious disease    Procedures/Surgeries: 06/24/23: Excisional  debridement of left groin abscess. Rotation of a sartorius muscle flap for coverage of the existing bypass grafts. Placement of a nondisposable wound VAC dressing left groin. Redo vascular surgery - Drs. [name] and Dew 06/26/23: Excisional debridement left groin wound. Placement of a nondisposable VAC dressing - Dr Jama 06/29/23: Left groin VAC dressing change - Dr Jama   07/02/23: Irrigation and debridement of skin, soft tissue, and muscle for approximately 125 cm to the left groin wound, Pulse lavage irrigation left groin. Left groin VAC dressing change - Dr Marea      Going back to OR today  ASSESSMENT & PLAN:   Left groin abscess Sepsis with septic shock resolved Status post I&D with vascular surgery 12/18 with takeback to the OR for left groin debridement, muscle flap, wound VAC exchange 12/20, wound vac exchange 12/23 ID on board and has recommended antibiotics with end date on 07/22/2023 Pain control Patient underwent wound VAC exchange on 07/06/2023 followed by 07/12/2024, 1/9 and now going back on Monday Follow vascular surgery recommendations   PAD s/p of left common femoral-tibioperoneal trunk bypass, thrombectomy, angioplasty and stent placement 04/2023  on aspirin , Plavix  and Eliquis   Continue eliquis  and aspirin  Continue statin therapy Continue aspirin    Leukocytosis-resolved Continue current antibiotics   Normocytic anemia Expected ABLA d/t surgeries normal hemoglobin prior to vascular procedures.   some blood tinge output in her wound VAC S/p 1 unit of packed red blood cells by vascular team.  Hemoglobin at 7.8 today -Starting on supplement -Continue to monitor -Transfuse below 7  AKI - resolved Likely d/t septic shock resolved Continue to  monitor renal function   Hypokalemia D/t Lasix   Replace as needed Monitor BMP   PVC Patient noted to have frequent PVCs on tele This is likely causing her heart rate to appear to be lower than normal.  Will ensure  potassium >4, Magnesium >2 Continue to monitor   Hyponatremia  Resolved Monitor sodium level   Chronic diastolic HF Edema bilateral LE Monitor fluid status  Diuresis prn  Monitor BMP w/ diuresis   Hypoalbuminemia  Encourage po intake    HTN  HLD Continue statin Hold home antihypertensives. Mostly normotensive off of midodrine  Continue to monitor blood pressure   T2DM  A1c 8, CBG elevated this morning after decreasing the dose of Semglee  Increase Semglee  to 12 units at night Continue mealtime 3 units  daily Continue with sensitive SSI       DVT prophylaxis: ELiquis     Central lines / invasive devices: wound vac    Code Status: FULL CODE   TOC needs: home health  Barriers to dispo / significant pending items: wound vac in place, iv abx, planning wound vac change in OR      Subjective Patient with no new concern.   Family Communication:      Physical Exam General.  Well-developed lady, in no acute distress. Pulmonary.  Lungs clear bilaterally, normal respiratory effort. CV.  Regular rate and rhythm, no JVD, rub or murmur. Abdomen.  Soft, nontender, nondistended, BS positive. CNS.  Alert and oriented .  No focal neurologic deficit. Extremities.  1+ LLE and wound VAC on left groin Psychiatry.  Judgment and insight appears normal.     Data Reviewed:     Latest Ref Rng & Units 07/17/2023    5:00 AM 07/10/2023    5:06 AM 07/09/2023    5:00 AM  BMP  Glucose 70 - 99 mg/dL 877  54  867   BUN 8 - 23 mg/dL 16  16  17    Creatinine 0.44 - 1.00 mg/dL 9.26  9.36  9.40   Sodium 135 - 145 mmol/L 136  137  136   Potassium 3.5 - 5.1 mmol/L 3.4  3.5  3.5   Chloride 98 - 111 mmol/L 100  102  102   CO2 22 - 32 mmol/L 26  27  24    Calcium  8.9 - 10.3 mg/dL 8.2  7.7  7.9     Vitals:   07/16/23 1545 07/16/23 1620 07/16/23 2348 07/17/23 0805  BP: (!) 152/53 135/66 (!) 110/35 (!) 148/61  Pulse: 65 69 63 66  Resp: 10 17 17 16   Temp:  97.8 F (36.6 C)  97.9 F (36.6 C)   TempSrc:    Oral  SpO2: 97% 94% 92% 92%  Weight:      Height:          Latest Ref Rng & Units 07/17/2023    5:00 AM 07/10/2023    5:06 AM 07/09/2023    5:00 AM  CBC  WBC 4.0 - 10.5 K/uL 8.8  8.7  9.0   Hemoglobin 12.0 - 15.0 g/dL 9.1  7.8  7.7   Hematocrit 36.0 - 46.0 % 28.3  23.2  22.6   Platelets 150 - 400 K/uL 503  338  323      Author: Amaryllis Dare, MD 07/17/2023 3:00 PM  For on call review [email or web address].

## 2023-07-17 NOTE — Progress Notes (Signed)
 ID Pt sleeping   O/e awake and alert Patient Vitals for the past 24 hrs:  BP Temp Temp src Pulse Resp SpO2 Height Weight  07/17/23 0805 (!) 148/61 97.9 F (36.6 C) Oral 66 16 92 % -- --  07/16/23 2348 (!) 110/35 -- -- 63 17 92 % -- --  07/16/23 1620 135/66 97.8 F (36.6 C) -- 69 17 94 % -- --  07/16/23 1545 (!) 152/53 -- -- 65 10 97 % -- --  07/16/23 1530 (!) 146/54 -- -- 63 13 94 % -- --  07/16/23 1527 (!) 150/56 (!) 97.5 F (36.4 C) -- 64 14 98 % -- --  07/16/23 1350 (!) 153/68 98.2 F (36.8 C) Oral 71 16 93 % 5' 7 (1.702 m) 69.4 kg   Left groin wound  picture reviewed      Edema legs- left > rt Left TMA  CNS non focal   Labs    Latest Ref Rng & Units 07/17/2023    5:00 AM 07/10/2023    5:06 AM 07/09/2023    5:00 AM  CBC  WBC 4.0 - 10.5 K/uL 8.8  8.7  9.0   Hemoglobin 12.0 - 15.0 g/dL 9.1  7.8  7.7   Hematocrit 36.0 - 46.0 % 28.3  23.2  22.6   Platelets 150 - 400 K/uL 503  338  323        Latest Ref Rng & Units 07/17/2023    5:00 AM 07/10/2023    5:06 AM 07/09/2023    5:00 AM  CMP  Glucose 70 - 99 mg/dL 877  54  867   BUN 8 - 23 mg/dL 16  16  17    Creatinine 0.44 - 1.00 mg/dL 9.26  9.36  9.40   Sodium 135 - 145 mmol/L 136  137  136   Potassium 3.5 - 5.1 mmol/L 3.4  3.5  3.5   Chloride 98 - 111 mmol/L 100  102  102   CO2 22 - 32 mmol/L 26  27  24    Calcium  8.9 - 10.3 mg/dL 8.2  7.7  7.9     Micro Groin abscess- Ecoli  Impression/recommendation PAD s/p left common femoral artery to tibioperoneal trunk artery bypass  with PTFE graft in OCT 2024   Abscess due to Ecoli  at the surgical site- CT showed  10 cm complex collection s/p excisional debridement of the abscess and rotation of a sarorius muscle flap for coverage of existing bypass grafts 06/24/23 Repeat culture from 12/26 is negative    Discussed with Dr. Jama . As vascular graft is exposed will treat like endovascular infection and give IV antibiotic for 4 weeks until 07/22/23 Unasyn  while in  hospital and ceftriaxone on discharge Leucocytosis-resolved  Candidiasis of the skin  around wound- started fluconazole    day 4- much better   Left TMA- healed well  ecoli and enterococcus in  culture in sept when she had TMA   CAD s/p CABG   H/o bladder ca s/p left nephrectomy  Solitary kidney- avoid nephrotoxic drugs   AKI-  resolved ID will not see her this weekend Call if needed

## 2023-07-18 DIAGNOSIS — N179 Acute kidney failure, unspecified: Secondary | ICD-10-CM | POA: Diagnosis not present

## 2023-07-18 DIAGNOSIS — I739 Peripheral vascular disease, unspecified: Secondary | ICD-10-CM | POA: Diagnosis not present

## 2023-07-18 DIAGNOSIS — D72829 Elevated white blood cell count, unspecified: Secondary | ICD-10-CM | POA: Diagnosis not present

## 2023-07-18 DIAGNOSIS — M79605 Pain in left leg: Secondary | ICD-10-CM | POA: Diagnosis not present

## 2023-07-18 LAB — GLUCOSE, CAPILLARY
Glucose-Capillary: 115 mg/dL — ABNORMAL HIGH (ref 70–99)
Glucose-Capillary: 203 mg/dL — ABNORMAL HIGH (ref 70–99)
Glucose-Capillary: 115 mg/dL — ABNORMAL HIGH (ref 70–99)
Glucose-Capillary: 247 mg/dL — ABNORMAL HIGH (ref 70–99)

## 2023-07-18 NOTE — Plan of Care (Signed)
   Problem: Education: Goal: Knowledge of General Education information will improve Description Including pain rating scale, medication(s)/side effects and non-pharmacologic comfort measures Outcome: Progressing   Problem: Activity: Goal: Risk for activity intolerance will decrease Outcome: Progressing   Problem: Safety: Goal: Ability to remain free from injury will improve Outcome: Progressing

## 2023-07-18 NOTE — Plan of Care (Signed)
  Problem: Education: Goal: Ability to describe self-care measures that may prevent or decrease complications (Diabetes Survival Skills Education) will improve Outcome: Progressing Goal: Individualized Educational Video(s) Outcome: Progressing   Problem: Coping: Goal: Ability to adjust to condition or change in health will improve Outcome: Progressing   Problem: Nutritional: Goal: Maintenance of adequate nutrition will improve Outcome: Progressing Goal: Progress toward achieving an optimal weight will improve Outcome: Progressing   Problem: Education: Goal: Knowledge of General Education information will improve Description: Including pain rating scale, medication(s)/side effects and non-pharmacologic comfort measures Outcome: Progressing   Problem: Activity: Goal: Risk for activity intolerance will decrease Outcome: Progressing   Problem: Safety: Goal: Ability to remain free from injury will improve Outcome: Progressing

## 2023-07-18 NOTE — Progress Notes (Signed)
 Progress Note   Patient: Laura Reed FMW:[id number] DOB: [birth year] DOA: 06/21/2023     27 DOS: the patient was seen and examined on 07/18/2023      Brief hospital course:  HPI: 63 y.o female with significant PMH of HTN, HLD, DM, sCHF, CAD, PAD (s/p of left common femoral-distal bypass, thrombectomy, angioplasty and stent placement 04/2023 on aspirin , Plavix  and Eliquis ), anxiety, bladder cancer (s/p of nephrectomy). [facility] on 06/21/23 with complaints of right groin pain, redness and oozing post op. Patient has hx of PAD and recently underwent left common femoral-distal bypass, thrombectomy, angioplasty and stent placement 04/2023 and right transmetatarsal amputation. SBP in the 50s and was sent to the ED for further evaluation.  Patient was initially admitted to hospitalist service later transferred to ICU for pressor support.  Patient has undergone multiple trips to the OR for wound debridement and wound VAC exchange.  1/3: Hemodynamically stable.  Going back to the OR on Monday.  Patient will need need wound VAC for many weeks. TOC having issues finding and home health agency.  1/4: Had an episode of hypoglycemia earlier this morning.  Requiring intervention.  Semglee  dose was decreased to 10 from 15 at night, also making sensitive SSI.  1/5: CBG elevated this morning so increasing the dose of Semglee  to 12 units.  Going for another wound VAC change with vascular surgery tomorrow, n.p.o. after midnight.  1/6: Patient had another wound VAC exchange and further debridement in OR with vascular surgery today.  1/7: Remained hemodynamically stable, going back to the OR on 07/16/2023  1/8: Hemodynamically stable, going back to the OR tomorrow  1/9: Remained stable, going back to the OR with vascular today  1/10: Hemodynamically stable.  Going back to the OR on Monday     Consultants:  [facility] Vascular Surgery  Infectious disease    Procedures/Surgeries: 06/24/23: Excisional  debridement of left groin abscess. Rotation of a sartorius muscle flap for coverage of the existing bypass grafts. Placement of a nondisposable wound VAC dressing left groin. Redo vascular surgery - Drs. [name] and Dew 06/26/23: Excisional debridement left groin wound. Placement of a nondisposable VAC dressing - Dr Jama 06/29/23: Left groin VAC dressing change - Dr Jama   07/02/23: Irrigation and debridement of skin, soft tissue, and muscle for approximately 125 cm to the left groin wound, Pulse lavage irrigation left groin. Left groin VAC dressing change - Dr Marea      Going back to OR today  ASSESSMENT & PLAN:   Left groin abscess Sepsis with septic shock resolved Status post I&D with vascular surgery 12/18 with takeback to the OR for left groin debridement, muscle flap, wound VAC exchange 12/20, wound vac exchange 12/23 ID on board and has recommended antibiotics with end date on 07/22/2023 Pain control Patient underwent wound VAC exchange on 07/06/2023 followed by 07/12/2024, 1/9 and now going back on Monday Follow vascular surgery recommendations   PAD s/p of left common femoral-tibioperoneal trunk bypass, thrombectomy, angioplasty and stent placement 04/2023  on aspirin , Plavix  and Eliquis   Continue eliquis  and aspirin  Continue statin therapy Continue aspirin    Leukocytosis-resolved Continue current antibiotics   Normocytic anemia Expected ABLA d/t surgeries normal hemoglobin prior to vascular procedures.   some blood tinge output in her wound VAC S/p 1 unit of packed red blood cells by vascular team.  Hemoglobin at 7.8 today -Starting on supplement -Continue to monitor -Transfuse below 7  AKI - resolved Likely d/t septic shock resolved Continue to  monitor renal function   Hypokalemia D/t Lasix   Replace as needed Monitor BMP   PVC Patient noted to have frequent PVCs on tele This is likely causing her heart rate to appear to be lower than normal.  Will ensure  potassium >4, Magnesium >2 Continue to monitor   Hyponatremia  Resolved Monitor sodium level   Chronic diastolic HF Edema bilateral LE Monitor fluid status  Diuresis prn  Monitor BMP w/ diuresis   Hypoalbuminemia  Encourage po intake    HTN  HLD Continue statin Hold home antihypertensives. Mostly normotensive off of midodrine  Continue to monitor blood pressure   T2DM  A1c 8, CBG elevated this morning after decreasing the dose of Semglee  Increase Semglee  to 12 units at night Continue mealtime 3 units  daily Continue with sensitive SSI       DVT prophylaxis: ELiquis     Central lines / invasive devices: wound vac    Code Status: FULL CODE   TOC needs: home health  Barriers to dispo / significant pending items: wound vac in place, iv abx, planning wound vac change in OR      Subjective Patient was sitting comfortably on the side of the bed when seen today.  No new concern.   Family Communication: Sister at bedside     Physical Exam General.  Well-developed lady, in no acute distress. Pulmonary.  Lungs clear bilaterally, normal respiratory effort. CV.  Regular rate and rhythm, no JVD, rub or murmur. Abdomen.  Soft, nontender, nondistended, BS positive. CNS.  Alert and oriented .  No focal neurologic deficit. Extremities.  1+ LLE, wound VAC on left groin Psychiatry.  Judgment and insight appears normal. .     Data Reviewed:     Latest Ref Rng & Units 07/17/2023    5:00 AM 07/10/2023    5:06 AM 07/09/2023    5:00 AM  BMP  Glucose 70 - 99 mg/dL 877  54  867   BUN 8 - 23 mg/dL 16  16  17    Creatinine 0.44 - 1.00 mg/dL 9.26  9.36  9.40   Sodium 135 - 145 mmol/L 136  137  136   Potassium 3.5 - 5.1 mmol/L 3.4  3.5  3.5   Chloride 98 - 111 mmol/L 100  102  102   CO2 22 - 32 mmol/L 26  27  24    Calcium  8.9 - 10.3 mg/dL 8.2  7.7  7.9     Vitals:   07/17/23 1647 07/17/23 2312 07/18/23 0431 07/18/23 0800  BP: (!) 143/48 (!) 140/51  (!) 150/54  Pulse: 71 68  63   Resp: 16 16  16   Temp: 98.6 F (37 C) 98.3 F (36.8 C)  (!) 97.5 F (36.4 C)  TempSrc: Oral   Oral  SpO2: 98% 92%  98%  Weight:   76.1 kg   Height:          Latest Ref Rng & Units 07/17/2023    5:00 AM 07/10/2023    5:06 AM 07/09/2023    5:00 AM  CBC  WBC 4.0 - 10.5 K/uL 8.8  8.7  9.0   Hemoglobin 12.0 - 15.0 g/dL 9.1  7.8  7.7   Hematocrit 36.0 - 46.0 % 28.3  23.2  22.6   Platelets 150 - 400 K/uL 503  338  323      Author: Amaryllis Dare, MD 07/18/2023 3:24 PM  For on call review [email or web address].

## 2023-07-18 NOTE — Progress Notes (Signed)
      Daily Progress Note   Assessment/Planning:   s/p multiple L groin debridements, sartorial flap, VAC   VAC adherent Return to OR Monday   Subjective  - 2 Days Post-Op   No complaints   Objective   Vitals:   07/17/23 1647 07/17/23 2312 07/18/23 0431 07/18/23 0800  BP: (!) 143/48 (!) 140/51  (!) 150/54  Pulse: 71 68  63  Resp: 16 16  16   Temp: 98.6 F (37 C) 98.3 F (36.8 C)  (!) 97.5 F (36.4 C)  TempSrc: Oral   Oral  SpO2: 98% 92%  98%  Weight:   76.1 kg   Height:         Intake/Output Summary (Last 24 hours) at 07/18/2023 0938 Last data filed at 07/18/2023 0300 Gross per 24 hour  Intake 779.28 ml  Output 25 ml  Net 754.28 ml    VASC L groin: VAC adherent, serosang drainage; both feet warm and viable    Laboratory   CBC    Latest Ref Rng & Units 07/17/2023    5:00 AM 07/10/2023    5:06 AM 07/09/2023    5:00 AM  CBC  WBC 4.0 - 10.5 K/uL 8.8  8.7  9.0   Hemoglobin 12.0 - 15.0 g/dL 9.1  7.8  7.7   Hematocrit 36.0 - 46.0 % 28.3  23.2  22.6   Platelets 150 - 400 K/uL 503  338  323     BMET    Component Value Date/Time   NA 136 07/17/2023 0500   NA 136 08/02/2021 1629   NA 135 11/02/2014 0609   K 3.4 (L) 07/17/2023 0500   K 4.2 11/02/2014 0609   CL 100 07/17/2023 0500   CL 107 11/02/2014 0609   CO2 26 07/17/2023 0500   CO2 23 11/02/2014 0609   GLUCOSE 122 (H) 07/17/2023 0500   GLUCOSE 108 (H) 11/02/2014 0609   BUN 16 07/17/2023 0500   BUN 25 08/02/2021 1629   BUN 20 11/02/2014 0609   CREATININE 0.73 07/17/2023 0500   CREATININE 1.01 11/09/2015 1549   CREATININE 1.01 11/09/2015 1549   CALCIUM  8.2 (L) 07/17/2023 0500   CALCIUM  7.3 (L) 11/02/2014 0609   GFRNONAA >60 07/17/2023 0500   GFRNONAA 50 (L) 11/02/2014 0609   GFRAA >60 01/19/2019 0357   GFRAA 58 (L) 11/02/2014 9390     Redell Door, MD, FACS, FSVS [facility]: [phone]  07/18/2023, 9:38 AM

## 2023-07-18 NOTE — H&P (View-Only) (Signed)
      Daily Progress Note   Assessment/Planning:   s/p multiple L groin debridements, sartorial flap, VAC   VAC adherent Return to OR Monday   Subjective  - 2 Days Post-Op   No complaints   Objective   Vitals:   07/17/23 1647 07/17/23 2312 07/18/23 0431 07/18/23 0800  BP: (!) 143/48 (!) 140/51  (!) 150/54  Pulse: 71 68  63  Resp: 16 16  16   Temp: 98.6 F (37 C) 98.3 F (36.8 C)  (!) 97.5 F (36.4 C)  TempSrc: Oral   Oral  SpO2: 98% 92%  98%  Weight:   76.1 kg   Height:         Intake/Output Summary (Last 24 hours) at 07/18/2023 0938 Last data filed at 07/18/2023 0300 Gross per 24 hour  Intake 779.28 ml  Output 25 ml  Net 754.28 ml    VASC L groin: VAC adherent, serosang drainage; both feet warm and viable    Laboratory   CBC    Latest Ref Rng & Units 07/17/2023    5:00 AM 07/10/2023    5:06 AM 07/09/2023    5:00 AM  CBC  WBC 4.0 - 10.5 K/uL 8.8  8.7  9.0   Hemoglobin 12.0 - 15.0 g/dL 9.1  7.8  7.7   Hematocrit 36.0 - 46.0 % 28.3  23.2  22.6   Platelets 150 - 400 K/uL 503  338  323     BMET    Component Value Date/Time   NA 136 07/17/2023 0500   NA 136 08/02/2021 1629   NA 135 11/02/2014 0609   K 3.4 (L) 07/17/2023 0500   K 4.2 11/02/2014 0609   CL 100 07/17/2023 0500   CL 107 11/02/2014 0609   CO2 26 07/17/2023 0500   CO2 23 11/02/2014 0609   GLUCOSE 122 (H) 07/17/2023 0500   GLUCOSE 108 (H) 11/02/2014 0609   BUN 16 07/17/2023 0500   BUN 25 08/02/2021 1629   BUN 20 11/02/2014 0609   CREATININE 0.73 07/17/2023 0500   CREATININE 1.01 11/09/2015 1549   CREATININE 1.01 11/09/2015 1549   CALCIUM  8.2 (L) 07/17/2023 0500   CALCIUM  7.3 (L) 11/02/2014 0609   GFRNONAA >60 07/17/2023 0500   GFRNONAA 50 (L) 11/02/2014 0609   GFRAA >60 01/19/2019 0357   GFRAA 58 (L) 11/02/2014 9390     Redell Door, MD, FACS, FSVS [facility]: [phone]  07/18/2023, 9:38 AM

## 2023-07-19 DIAGNOSIS — D72829 Elevated white blood cell count, unspecified: Secondary | ICD-10-CM | POA: Diagnosis not present

## 2023-07-19 DIAGNOSIS — I739 Peripheral vascular disease, unspecified: Secondary | ICD-10-CM | POA: Diagnosis not present

## 2023-07-19 DIAGNOSIS — M79605 Pain in left leg: Secondary | ICD-10-CM | POA: Diagnosis not present

## 2023-07-19 DIAGNOSIS — N179 Acute kidney failure, unspecified: Secondary | ICD-10-CM | POA: Diagnosis not present

## 2023-07-19 LAB — GLUCOSE, CAPILLARY
Glucose-Capillary: 86 mg/dL (ref 70–99)
Glucose-Capillary: 53 mg/dL — ABNORMAL LOW (ref 70–99)
Glucose-Capillary: 57 mg/dL — ABNORMAL LOW (ref 70–99)
Glucose-Capillary: 75 mg/dL (ref 70–99)
Glucose-Capillary: 238 mg/dL — ABNORMAL HIGH (ref 70–99)
Glucose-Capillary: 174 mg/dL — ABNORMAL HIGH (ref 70–99)

## 2023-07-19 NOTE — Progress Notes (Signed)
 Progress Note   Patient: Laura Reed FMW:[id number] DOB: 01-16-1961 DOA: 06/21/2023     28 DOS: the patient was seen and examined on 07/19/2023      Brief hospital course:  HPI: 63 y.o female with significant PMH of HTN, HLD, DM, sCHF, CAD, PAD (s/p of left common femoral-distal bypass, thrombectomy, angioplasty and stent placement 04/2023 on aspirin , Plavix  and Eliquis ), anxiety, bladder cancer (s/p of nephrectomy). [facility] on 06/21/23 with complaints of right groin pain, redness and oozing post op. Patient has hx of PAD and recently underwent left common femoral-distal bypass, thrombectomy, angioplasty and stent placement 04/2023 and right transmetatarsal amputation. SBP in the 50s and was sent to the ED for further evaluation.  Patient was initially admitted to hospitalist service later transferred to ICU for pressor support.  Patient has undergone multiple trips to the OR for wound debridement and wound VAC exchange.  1/3: Hemodynamically stable.  Going back to the OR on Monday.  Patient will need need wound VAC for many weeks. TOC having issues finding and home health agency.  1/4: Had an episode of hypoglycemia earlier this morning.  Requiring intervention.  Semglee  dose was decreased to 10 from 15 at night, also making sensitive SSI.  1/5: CBG elevated this morning so increasing the dose of Semglee  to 12 units.  Going for another wound VAC change with vascular surgery tomorrow, n.p.o. after midnight.  1/6: Patient had another wound VAC exchange and further debridement in OR with vascular surgery today.  1/7: Remained hemodynamically stable, going back to the OR on 07/16/2023  1/8: Hemodynamically stable, going back to the OR tomorrow  1/9: Remained stable, going back to the OR with vascular today  1/10: Hemodynamically stable.  Going back to the OR on Monday     Consultants:  [facility] Vascular Surgery  Infectious disease    Procedures/Surgeries: 06/24/23: Excisional  debridement of left groin abscess. Rotation of a sartorius muscle flap for coverage of the existing bypass grafts. Placement of a nondisposable wound VAC dressing left groin. Redo vascular surgery - Drs. [name] and Dew 06/26/23: Excisional debridement left groin wound. Placement of a nondisposable VAC dressing - Dr Jama 06/29/23: Left groin VAC dressing change - Dr Jama   07/02/23: Irrigation and debridement of skin, soft tissue, and muscle for approximately 125 cm to the left groin wound, Pulse lavage irrigation left groin. Left groin VAC dressing change - Dr Marea      Going back to OR today  ASSESSMENT & PLAN:   Left groin abscess Sepsis with septic shock resolved Status post I&D with vascular surgery 12/18 with takeback to the OR for left groin debridement, muscle flap, wound VAC exchange 12/20, wound vac exchange 12/23 ID on board and has recommended antibiotics with end date on 07/22/2023 Pain control Patient underwent wound VAC exchange on 07/06/2023 followed by 07/12/2024, 1/9 and now going back on Monday Follow vascular surgery recommendations   PAD s/p of left common femoral-tibioperoneal trunk bypass, thrombectomy, angioplasty and stent placement 04/2023  on aspirin , Plavix  and Eliquis   Continue eliquis  and aspirin  Continue statin therapy Continue aspirin    Leukocytosis-resolved Continue current antibiotics   Normocytic anemia Expected ABLA d/t surgeries normal hemoglobin prior to vascular procedures.   some blood tinge output in her wound VAC S/p 1 unit of packed red blood cells by vascular team.  Hemoglobin at 7.8 today -Starting on supplement -Continue to monitor -Transfuse below 7  AKI - resolved Likely d/t septic shock resolved Continue to  monitor renal function   Hypokalemia D/t Lasix   Replace as needed Monitor BMP   PVC Patient noted to have frequent PVCs on tele This is likely causing her heart rate to appear to be lower than normal.  Will ensure  potassium >4, Magnesium >2 Continue to monitor   Hyponatremia  Resolved Monitor sodium level   Chronic diastolic HF Edema bilateral LE Monitor fluid status  Diuresis prn  Monitor BMP w/ diuresis   Hypoalbuminemia  Encourage po intake    HTN  HLD Continue statin Hold home antihypertensives. Mostly normotensive off of midodrine  Continue to monitor blood pressure   T2DM  A1c 8, CBG elevated this morning after decreasing the dose of Semglee  Increase Semglee  to 12 units at night Continue mealtime 3 units  daily Continue with sensitive SSI       DVT prophylaxis: ELiquis     Central lines / invasive devices: wound vac    Code Status: FULL CODE   TOC needs: home health  Barriers to dispo / significant pending items: wound vac in place, iv abx, planning wound vac change in OR      Subjective Patient with no new concern.  Left lower extremity edema improving by raising the leg at this time.  Waiting for another procedure tomorrow.   Family Communication:      Physical Exam General.  Well-developed lady, in no acute distress. Pulmonary.  Lungs clear bilaterally, normal respiratory effort. CV.  Regular rate and rhythm, no JVD, rub or murmur. Abdomen.  Soft, nontender, nondistended, BS positive. CNS.  Alert and oriented .  No focal neurologic deficit. Extremities.  Trace LLE edema, wound VAC in left groin Psychiatry.  Judgment and insight appears normal.  .     Data Reviewed:     Latest Ref Rng & Units 07/17/2023    5:00 AM 07/10/2023    5:06 AM 07/09/2023    5:00 AM  BMP  Glucose 70 - 99 mg/dL 877  54  867   BUN 8 - 23 mg/dL 16  16  17    Creatinine 0.44 - 1.00 mg/dL 9.26  9.36  9.40   Sodium 135 - 145 mmol/L 136  137  136   Potassium 3.5 - 5.1 mmol/L 3.4  3.5  3.5   Chloride 98 - 111 mmol/L 100  102  102   CO2 22 - 32 mmol/L 26  27  24    Calcium  8.9 - 10.3 mg/dL 8.2  7.7  7.9     Vitals:   07/19/23 0040 07/19/23 0415 07/19/23 0500 07/19/23 0945  BP: (!) 147/44  (!) 116/39  (!) 140/48  Pulse: 66 64  64  Resp: 16   14  Temp: 98.1 F (36.7 C) 97.6 F (36.4 C)  97.8 F (36.6 C)  TempSrc: Oral Oral  Oral  SpO2: 91% 92%  97%  Weight:   74.1 kg   Height:          Latest Ref Rng & Units 07/17/2023    5:00 AM 07/10/2023    5:06 AM 07/09/2023    5:00 AM  CBC  WBC 4.0 - 10.5 K/uL 8.8  8.7  9.0   Hemoglobin 12.0 - 15.0 g/dL 9.1  7.8  7.7   Hematocrit 36.0 - 46.0 % 28.3  23.2  22.6   Platelets 150 - 400 K/uL 503  338  323      Author: Amaryllis Dare, MD 07/19/2023 1:39 PM  For on call review [email or web address].

## 2023-07-20 ENCOUNTER — Ambulatory Visit (INDEPENDENT_AMBULATORY_CARE_PROVIDER_SITE_OTHER): Payer: 59 | Admitting: Vascular Surgery

## 2023-07-20 ENCOUNTER — Encounter (INDEPENDENT_AMBULATORY_CARE_PROVIDER_SITE_OTHER): Payer: 59

## 2023-07-20 ENCOUNTER — Other Ambulatory Visit: Payer: Self-pay

## 2023-07-20 ENCOUNTER — Inpatient Hospital Stay: Payer: 59 | Admitting: Certified Registered"

## 2023-07-20 ENCOUNTER — Encounter: Payer: Self-pay | Admitting: Internal Medicine

## 2023-07-20 ENCOUNTER — Encounter
Admission: EM | Disposition: A | Discharge status: Home Health | Payer: Self-pay | Source: Home / Self Care | Attending: Internal Medicine

## 2023-07-20 DIAGNOSIS — M79605 Pain in left leg: Secondary | ICD-10-CM | POA: Diagnosis not present

## 2023-07-20 DIAGNOSIS — L02214 Cutaneous abscess of groin: Secondary | ICD-10-CM | POA: Diagnosis not present

## 2023-07-20 DIAGNOSIS — T827XXA Infection and inflammatory reaction due to other cardiac and vascular devices, implants and grafts, initial encounter: Secondary | ICD-10-CM | POA: Diagnosis not present

## 2023-07-20 DIAGNOSIS — D72829 Elevated white blood cell count, unspecified: Secondary | ICD-10-CM | POA: Diagnosis not present

## 2023-07-20 DIAGNOSIS — B9620 Unspecified Escherichia coli [E. coli] as the cause of diseases classified elsewhere: Secondary | ICD-10-CM | POA: Diagnosis not present

## 2023-07-20 DIAGNOSIS — I739 Peripheral vascular disease, unspecified: Secondary | ICD-10-CM | POA: Diagnosis not present

## 2023-07-20 DIAGNOSIS — N179 Acute kidney failure, unspecified: Secondary | ICD-10-CM | POA: Diagnosis not present

## 2023-07-20 HISTORY — PX: APPLICATION OF WOUND VAC: SHX5189

## 2023-07-20 LAB — CBC
HCT: 28.5 % — ABNORMAL LOW (ref 36.0–46.0)
Hemoglobin: 9.4 g/dL — ABNORMAL LOW (ref 12.0–15.0)
MCH: 28.7 pg (ref 26.0–34.0)
MCHC: 33 g/dL (ref 30.0–36.0)
MCV: 87.2 fL (ref 80.0–100.0)
Platelets: 456 10*3/uL — ABNORMAL HIGH (ref 150–400)
RBC: 3.27 MIL/uL — ABNORMAL LOW (ref 3.87–5.11)
RDW: 15.4 % (ref 11.5–15.5)
WBC: 10.3 10*3/uL (ref 4.0–10.5)
nRBC: 0 % (ref 0.0–0.2)

## 2023-07-20 LAB — GLUCOSE, CAPILLARY
Glucose-Capillary: 161 mg/dL — ABNORMAL HIGH (ref 70–99)
Glucose-Capillary: 165 mg/dL — ABNORMAL HIGH (ref 70–99)
Glucose-Capillary: 117 mg/dL — ABNORMAL HIGH (ref 70–99)
Glucose-Capillary: 174 mg/dL — ABNORMAL HIGH (ref 70–99)
Glucose-Capillary: 249 mg/dL — ABNORMAL HIGH (ref 70–99)

## 2023-07-20 LAB — BASIC METABOLIC PANEL
Anion gap: 9 (ref 5–15)
BUN: 22 mg/dL (ref 8–23)
CO2: 25 mmol/L (ref 22–32)
Calcium: 8.2 mg/dL — ABNORMAL LOW (ref 8.9–10.3)
Chloride: 103 mmol/L (ref 98–111)
Creatinine, Ser: 0.87 mg/dL (ref 0.44–1.00)
GFR, Estimated: >60 mL/min (ref >60)
Glucose, Bld: 183 mg/dL — ABNORMAL HIGH (ref 70–99)
Potassium: 4 mmol/L (ref 3.5–5.1)
Sodium: 137 mmol/L (ref 135–145)

## 2023-07-20 SURGERY — APPLICATION, WOUND VAC
Anesthesia: General | Laterality: Left

## 2023-07-20 MED ORDER — MIDAZOLAM HCL 2 MG/2ML IJ SOLN
INTRAMUSCULAR | Status: DC | PRN
  Administered 2023-07-20: 2 mg via INTRAVENOUS

## 2023-07-20 MED ORDER — MIDAZOLAM HCL 2 MG/2ML IJ SOLN
INTRAMUSCULAR | Status: AC
Start: 2023-07-20 — End: ?
  Filled 2023-07-20: qty 2

## 2023-07-20 MED ORDER — PROPOFOL 500 MG/50ML IV EMUL
INTRAVENOUS | Status: DC | PRN
  Administered 2023-07-20: 75 ug/kg/min via INTRAVENOUS

## 2023-07-20 MED ORDER — ONDANSETRON HCL 4 MG/2ML IJ SOLN
INTRAMUSCULAR | Status: DC | PRN
  Administered 2023-07-20: 4 mg via INTRAVENOUS

## 2023-07-20 MED ORDER — AMOXICILLIN-POT CLAVULANATE 875-125 MG PO TABS
1.0000 | ORAL_TABLET | Freq: Two times a day (BID) | ORAL | Status: DC
  Administered 2023-07-23 – 2023-07-24 (×3): 1 via ORAL
  Filled 2023-07-20 (×3): qty 1

## 2023-07-20 MED ORDER — PROPOFOL 1000 MG/100ML IV EMUL
INTRAVENOUS | Status: AC
  Filled 2023-07-20: qty 200

## 2023-07-20 MED ORDER — FENTANYL CITRATE (PF) 100 MCG/2ML IJ SOLN
INTRAMUSCULAR | Status: AC
  Filled 2023-07-20: qty 2

## 2023-07-20 MED ORDER — DEXAMETHASONE SODIUM PHOSPHATE 10 MG/ML IJ SOLN
INTRAMUSCULAR | Status: DC | PRN
  Administered 2023-07-20: 5 mg via INTRAVENOUS

## 2023-07-20 MED ORDER — FENTANYL CITRATE (PF) 100 MCG/2ML IJ SOLN
INTRAMUSCULAR | Status: DC | PRN
  Administered 2023-07-20: 25 ug via INTRAVENOUS

## 2023-07-20 MED ORDER — GLYCOPYRROLATE 0.2 MG/ML IJ SOLN
INTRAMUSCULAR | Status: DC | PRN
  Administered 2023-07-20: .2 mg via INTRAVENOUS

## 2023-07-20 MED ORDER — SODIUM CHLORIDE 0.9 % IV SOLN: INTRAVENOUS | Status: DC | PRN

## 2023-07-20 MED ORDER — LIDOCAINE HCL (CARDIAC) PF 100 MG/5ML IV SOSY
PREFILLED_SYRINGE | INTRAVENOUS | Status: DC | PRN
  Administered 2023-07-20: 60 mg via INTRATRACHEAL

## 2023-07-20 MED ORDER — PROPOFOL 10 MG/ML IV BOLUS
INTRAVENOUS | Status: DC | PRN
  Administered 2023-07-20: 30 mg via INTRAVENOUS

## 2023-07-20 MED ORDER — FENTANYL CITRATE (PF) 100 MCG/2ML IJ SOLN: 25.0000 ug | INTRAMUSCULAR | Status: DC | PRN

## 2023-07-20 SURGICAL SUPPLY — 29 items
BNDG COHESIVE 6X5 TAN ST LF (GAUZE/BANDAGES/DRESSINGS) ×1 IMPLANT
CANISTER WOUND CARE 500ML ATS (WOUND CARE) IMPLANT
CHLORAPREP W/TINT 26 (MISCELLANEOUS) ×1 IMPLANT
DRAPE EXTREMITY 106X87X128.5 (DRAPES) IMPLANT
DRAPE INCISE IOBAN 66X45 STRL (DRAPES) IMPLANT
DRSG EMULSION OIL 3X3 NADH (GAUZE/BANDAGES/DRESSINGS) IMPLANT
DRSG VAC GRANUFOAM LG (GAUZE/BANDAGES/DRESSINGS) IMPLANT
DRSG VAC GRANUFOAM MED (GAUZE/BANDAGES/DRESSINGS) IMPLANT
ELECT REM PT RETURN 9FT ADLT (ELECTROSURGICAL) ×1 IMPLANT
ELECTRODE REM PT RTRN 9FT ADLT (ELECTROSURGICAL) ×1 IMPLANT
GAUZE SPONGE 4X4 12PLY STRL (GAUZE/BANDAGES/DRESSINGS) IMPLANT
GAUZE STRETCH 2X75IN STRL (MISCELLANEOUS) IMPLANT
GLOVE BIO SURGEON STRL SZ7 (GLOVE) ×1 IMPLANT
GLOVE SURG SYN 8.0 (GLOVE) ×1 IMPLANT
GLOVE SURG SYN 8.0 PF PI (GLOVE) ×1 IMPLANT
GOWN STRL REUS W/ TWL LRG LVL3 (GOWN DISPOSABLE) ×2 IMPLANT
GOWN STRL REUS W/ TWL XL LVL3 (GOWN DISPOSABLE) ×1 IMPLANT
GRAFT SKIN WND MICRO 38 (Tissue) IMPLANT
KIT TURNOVER KIT A (KITS) ×1 IMPLANT
LABEL OR SOLS (LABEL) ×1 IMPLANT
MANIFOLD NEPTUNE II (INSTRUMENTS) ×1 IMPLANT
NS IRRIG 500ML POUR BTL (IV SOLUTION) ×1 IMPLANT
PACK EXTREMITY ARMC (MISCELLANEOUS) ×1 IMPLANT
PAD PREP OB/GYN DISP 24X41 (PERSONAL CARE ITEMS) ×1 IMPLANT
SOL PREP PVP 2OZ (MISCELLANEOUS) ×1 IMPLANT
SOLUTION PREP PVP 2OZ (MISCELLANEOUS) ×1 IMPLANT
STOCKINETTE IMPERV 14X48 (MISCELLANEOUS) ×1 IMPLANT
TRAP FLUID SMOKE EVACUATOR (MISCELLANEOUS) ×1 IMPLANT
WATER STERILE IRR 500ML POUR (IV SOLUTION) ×1 IMPLANT

## 2023-07-20 NOTE — Transfer of Care (Addendum)
 Immediate Anesthesia Transfer of Care Note  Patient: Laura Reed  Procedure(s) Performed: WOUND VAC EXCHANGE (Left)  Patient Location: PACU  Anesthesia Type:General  Level of Consciousness: awake, drowsy, and patient cooperative  Airway & Oxygen Therapy: Patient Spontanous Breathing and Patient connected to face mask oxygen  Post-op Assessment: Report given to RN and Post -op Vital signs reviewed and stable  Post vital signs: Reviewed and stable  Last Vitals:  Vitals Value Taken Time  BP 127/48 07/20/23 0807  Temp    Pulse 63 07/20/23 0809  Resp 11 07/20/23 0809  SpO2 100 % 07/20/23 0809  Vitals shown include unfiled device data.  Last Pain:  Vitals:   07/20/23 0658  TempSrc: Oral  PainSc: 0-No pain      Patients Stated Pain Goal: 3 (07/17/23 2125)  Complications: No notable events documented.

## 2023-07-20 NOTE — Anesthesia Procedure Notes (Signed)
 Procedure Name: General with mask airway Date/Time: 07/20/2023 7:37 AM  Performed by: Ledora Duncan, CRNAPre-anesthesia Checklist: Patient identified, Emergency Drugs available, Suction available and Patient being monitored Patient Re-evaluated:Patient Re-evaluated prior to induction Oxygen Delivery Method: Simple face mask Induction Type: IV induction Placement Confirmation: positive ETCO2 and breath sounds checked- equal and bilateral Dental Injury: Teeth and Oropharynx as per pre-operative assessment

## 2023-07-20 NOTE — Progress Notes (Signed)
 PT Cancellation Note  Patient Details Name: Laura Reed MRN: [id number] DOB: Aug 16, 1960   Cancelled Treatment:     Pt to OR for Wound Vac Exchange. Will re-attempt next available date/time per POC.   Jannet Askew 07/20/2023, 3:44 PM

## 2023-07-20 NOTE — Progress Notes (Signed)
 Pt returned from surgery, alert and oriented x4. Wound vac @75  due to fish skin graft to left groin. Pt denies pain. NAD. Educated to call for breakfast. Pt verbalizes understanding.

## 2023-07-20 NOTE — Op Note (Signed)
    OPERATIVE NOTE   PROCEDURE: Excisional debridement left groin wound. Application of Kerecis graft guide micro to the wound. Application of a wound VAC dressing left groin  PRE-OPERATIVE DIAGNOSIS: Left groin wound status post drainage of abscess  POST-OPERATIVE DIAGNOSIS: Same  SURGEON: Cordella Shaye Elling  ASSISTANT(S): Gwendlyn Shank, NP  ANESTHESIA: MAC  ESTIMATED BLOOD LOSS: <5 cc  FINDING(S): Medially the graft remains exposed although it does appear to be a smaller segment.  Overall there is some slough and devitalized tissue.  SPECIMEN(S): Debrided tissue is not sent  INDICATIONS:   Laura Reed is a 63 y.o. female who presents with left groin wound that we are utilizing both Kerecis and the VAC dressing for closure.  DESCRIPTION: After full informed written consent was obtained from the patient, the patient was brought back to the operating room and placed supine upon the operating table.  Prior to induction, the patient received IV antibiotics.   After obtaining adequate anesthesia, the patient was then prepped and draped in the standard fashion for a debridement and VAC dressing change.  The existing VAC dressing is removed.  The Adaptic is removed.  There patchy areas of devitalized tissue which are debrided with Metzenbaum scissors and DeBakey forceps.  The wound was then irrigated with several 100 cc of saline.  Inspection demonstrates the graft remains visible in the medial aspect but at this point it does appear to be a shorter segment suggesting that we are getting coverage slowly.  The wound was then measured and is 15 cm in length by 4 cm in width by 3 cm in depth.  Kerecis graft guide micro is then reconstituted on the field and then applied to the area over the graft.  The Adaptic is then placed followed by a black VAC sponge and Ioban is utilized for a seal.  VAC is in the green before leaving the OR.   The patient tolerated this procedure well.    COMPLICATIONS: None  CONDITION: Laura Reed Cordella Shawl [facility]  Office: [phone]   07/20/2023, 8:13 AM

## 2023-07-20 NOTE — Anesthesia Preprocedure Evaluation (Signed)
 Anesthesia Evaluation  Patient identified by MRN, date of birth, ID band Patient awake    Reviewed: Allergy & Precautions, NPO status , Patient's Chart, lab work & pertinent test results  History of Anesthesia Complications Negative for: history of anesthetic complications  Airway Mallampati: III  TM Distance: >3 FB Neck ROM: full    Dental no notable dental hx. (+) Edentulous Upper, Edentulous Lower   Pulmonary neg COPD, neg recent URI, Patient abstained from smoking., former smoker   Pulmonary exam normal breath sounds clear to auscultation       Cardiovascular hypertension, On Medications (-) angina + CAD, + CABG, + Peripheral Vascular Disease and +CHF  (-) Cardiac Stents + dysrhythmias (rate controlled) Atrial Fibrillation + Valvular Problems/Murmurs  Rhythm:Regular Rate:Normal - Systolic murmurs    Neuro/Psych neg Seizures PSYCHIATRIC DISORDERS Anxiety      Neuromuscular disease    GI/Hepatic Neg liver ROS,GERD  Controlled,,  Endo/Other  diabetes, Insulin  Dependent    Renal/GU      Musculoskeletal   Abdominal   Peds  Hematology negative hematology ROS (+)   Anesthesia Other Findings Past Medical History: No date: Absence of kidney     Comment:  left No date: Anxiety No date: Arthritis No date: Bladder cancer (HCC) No date: CHF (congestive heart failure) (HCC) No date: Complication of anesthesia     Comment:  BP HAS  RUN LOW AFTER SURGERY-LUNGS FILLED UP WITH FLUID              AFTER  LEG STENT SURGERY  No date: Coronary artery disease No date: Diabetes mellitus No date: Family history of adverse reaction to anesthesia     Comment:  Sister - PONV No date: GERD (gastroesophageal reflux disease)     Comment:  OCC TUMS No date: Heart murmur No date: Hemorrhoid 2007: History of methicillin resistant staphylococcus aureus (MRSA) No date: Hypertension No date: Neuropathy No date: PVD (peripheral vascular  disease) (HCC) No date: Thyroid  nodule     Comment:  right 10/31/2014: Urothelial carcinoma of kidney (HCC)     Comment:  INVASIVE UROTHELIAL CARCINOMA, LOW GRADE. T1, Nx. No date: Wears dentures     Comment:  full upper and lower  Past Surgical History: No date: AMPUTATION TOE     Comment:  right (4th and 5th); left (great toe, 3rd) 07/16/2018: AMPUTATION TOE; Right     Comment:  Procedure: AMPUTATION TOE/MPJ right 2nd;  Surgeon:               Neill Boas, DPM;  Location: [facility];  Service:               Podiatry;  Laterality: Right; 2009, 2013 x 2: ARTERIAL BYPASS SURGRY     Comment:  right leg , done in [place] No date: CARDIAC CATHETERIZATION 01/2014: CAROTID ENDARTERECTOMY; Right     Comment:  Dr Jama 12/14/2014: CATARACT EXTRACTION W/PHACO; Right     Comment:  Procedure: CATARACT EXTRACTION PHACO AND INTRAOCULAR               LENS PLACEMENT (IOC);  Surgeon: Newell Ovens, MD;                Location: [facility];  Service: Ophthalmology;  Laterality:              Right;  US    00:38.6              AP        7.1  CDE  2.76 12/06/2019: CATARACT EXTRACTION W/PHACO; Left     Comment:  Procedure: CATARACT EXTRACTION PHACO AND INTRAOCULAR               LENS PLACEMENT (IOC) LEFT DIABETIC;  Surgeon: Jaye Fallow, MD;  Location: [facility];  Service:               Ophthalmology;  Laterality: Left;  9.08 1:06.4 No date: CESAREAN SECTION 03-03-12: CHOLECYSTECTOMY     Comment:  Porcelain gallbladder, gallstones,  [place] 04/28/2012: COLONOSCOPY W/ BIOPSIES     Comment:  Hyperplastic rectal polyps. 04/02/2022: COLONOSCOPY WITH PROPOFOL ; N/A     Comment:  Procedure: COLONOSCOPY WITH PROPOFOL ;  Surgeon: Dessa Reyes ORN, MD;  Location: [facility];  Service:               Endoscopy;  Laterality: N/A; 2009: CORONARY ARTERY BYPASS GRAFT     Comment:  3 vessel 09/01/2016: CYSTOSCOPY W/ RETROGRADES; Right     Comment:   Procedure: CYSTOSCOPY WITH RETROGRADE PYELOGRAM;                Surgeon: Rosina Riis, MD;  Location: [facility];                Service: Urology;  Laterality: Right; 03/19/2020: CYSTOSCOPY W/ RETROGRADES; Bilateral     Comment:  Procedure: CYSTOSCOPY WITH RETROGRADE PYELOGRAM;                Surgeon: Riis Rosina, MD;  Location: [facility];                Service: Urology;  Laterality: Bilateral; 03/19/2020: CYSTOSCOPY WITH BIOPSY; N/A     Comment:  Procedure: CYSTOSCOPY WITH BIOPSY;  Surgeon: Riis Rosina, MD;  Location: [facility];  Service: Urology;                Laterality: N/A; No date: EYE SURGERY 10-31-14: HERNIA REPAIR     Comment:  ventral, retro-rectus atrium mesh 01/18/2019: IRRIGATION AND DEBRIDEMENT FOOT; Left     Comment:  Procedure: IRRIGATION AND DEBRIDEMENT FOOT;  Surgeon:               Neill Boas, DPM;  Location: [facility];  Service:               Podiatry;  Laterality: Left; 12/10/2016: LOWER EXTREMITY ANGIOGRAPHY; Left     Comment:  Procedure: Lower Extremity Angiography;  Surgeon:               Jama Cordella MATSU, MD;  Location: [facility];               Service: Cardiovascular;  Laterality: Left; 02/02/2018: LOWER EXTREMITY ANGIOGRAPHY; Left     Comment:  Procedure: LOWER EXTREMITY ANGIOGRAPHY;  Surgeon:               Jama Cordella MATSU, MD;  Location: [facility];               Service: Cardiovascular;  Laterality: Left; 05/05/2018: LOWER EXTREMITY ANGIOGRAPHY; Left     Comment:  Procedure: LOWER EXTREMITY ANGIOGRAPHY;  Surgeon:               Jama Cordella MATSU, MD;  Location: [facility]  CV LAB;               Service: Cardiovascular;  Laterality: Left; 12/04/2020: LOWER EXTREMITY ANGIOGRAPHY; Left     Comment:  Procedure: LOWER EXTREMITY ANGIOGRAPHY with               Intervention;  Surgeon: Jama Cordella MATSU, MD;                Location: [facility];  Service: Cardiovascular;              Laterality:  Left; 04/24/2023: LOWER EXTREMITY ANGIOGRAPHY; Left     Comment:  Procedure: Lower Extremity Angiography;  Surgeon:               Jama Cordella MATSU, MD;  Location: [facility];               Service: Cardiovascular;  Laterality: Left; 10-31-14: NEPHRECTOMY; Left 05/01/2015: PERIPHERAL VASCULAR CATHETERIZATION; Left     Comment:  Procedure: Lower Extremity Angiography;  Surgeon:               Cordella MATSU Jama, MD;  Location: [facility];                Service: Cardiovascular;  Laterality: Left; 05/01/2015: PERIPHERAL VASCULAR CATHETERIZATION     Comment:  Procedure: Lower Extremity Intervention;  Surgeon:               Cordella MATSU Jama, MD;  Location: [facility];                Service: Cardiovascular;; 02/20/2015: PERIPHERAL VASCULAR CATHETERIZATION; Left     Comment:  Procedure: Pelvic Angiography;  Surgeon: Cordella MATSU Jama, MD;  Location: [facility];  Service:               Cardiovascular;  Laterality: Left; 09/01/2016: TRANSURETHRAL RESECTION OF BLADDER TUMOR WITH MITOMYCIN -C;  N/A     Comment:  Procedure: TRANSURETHRAL RESECTION OF BLADDER TUMOR WITH              MITOMYCIN -C;  Surgeon: Rosina Riis, MD;  Location:               [facility];  Service: Urology;  Laterality: N/A;  BMI    Body Mass Index: 21.13 kg/m      Reproductive/Obstetrics negative OB ROS                              Anesthesia Physical Anesthesia Plan  ASA: 3  Anesthesia Plan: General   Post-op Pain Management: Minimal or no pain anticipated   Induction: Intravenous  PONV Risk Score and Plan: 3 and Ondansetron , Propofol  infusion, TIVA and Midazolam   Airway Management Planned: Natural Airway and Nasal Cannula  Additional Equipment:   Intra-op Plan:   Post-operative Plan:   Informed Consent: I have reviewed the patients History and Physical, chart, labs and discussed the procedure including the risks, benefits and  alternatives for the proposed anesthesia with the patient or authorized representative who has indicated his/her understanding and acceptance.     Dental Advisory Given  Plan Discussed with: Anesthesiologist, CRNA and Surgeon  Anesthesia Plan Comments: (Patient consented for risks of anesthesia including but not limited to:  - adverse reactions to medications - risk of airway placement if required - damage to eyes,  teeth, lips or other oral mucosa - nerve damage due to positioning  - sore throat or hoarseness - Damage to heart, brain, nerves, lungs, other parts of body or loss of life  Patient voiced understanding and assent.)        Anesthesia Quick Evaluation

## 2023-07-20 NOTE — Progress Notes (Signed)
 Progress Note   Patient: Laura Reed FMW:[id number] DOB: [birth year] DOA: 06/21/2023     29 DOS: the patient was seen and examined on 07/20/2023      Brief hospital course:  HPI: 63 y.o female with significant PMH of HTN, HLD, DM, sCHF, CAD, PAD (s/p of left common femoral-distal bypass, thrombectomy, angioplasty and stent placement 04/2023 on aspirin , Plavix  and Eliquis ), anxiety, bladder cancer (s/p of nephrectomy). [facility] on 06/21/23 with complaints of right groin pain, redness and oozing post op. Patient has hx of PAD and recently underwent left common femoral-distal bypass, thrombectomy, angioplasty and stent placement 04/2023 and right transmetatarsal amputation. SBP in the 50s and was sent to the ED for further evaluation.  Patient was initially admitted to hospitalist service later transferred to ICU for pressor support.  Patient has undergone multiple trips to the OR for wound debridement and wound VAC exchange.  1/3: Hemodynamically stable.  Going back to the OR on Monday.  Patient will need need wound VAC for many weeks. TOC having issues finding and home health agency.  1/4: Had an episode of hypoglycemia earlier this morning.  Requiring intervention.  Semglee  dose was decreased to 10 from 15 at night, also making sensitive SSI.  1/5: CBG elevated this morning so increasing the dose of Semglee  to 12 units.  Going for another wound VAC change with vascular surgery tomorrow, n.p.o. after midnight.  1/6: Patient had another wound VAC exchange and further debridement in OR with vascular surgery today.  1/7: Remained hemodynamically stable, going back to the OR on 07/16/2023  1/8: Hemodynamically stable, going back to the OR tomorrow  1/9: Remained stable, going back to the OR with vascular today  1/10: Hemodynamically stable.  Going back to the OR on Monday.  1/12: Patient had another wound VAC exchange in OR today.  Wound seems healing well.  Will need another wound  exchange within a week.  Patient will continue with IV antibiotics until 1/15, that will complete 4 weeks followed by Augmentin  per ID.     Consultants:  [facility] Vascular Surgery  Infectious disease    Procedures/Surgeries: 06/24/23: Excisional debridement of left groin abscess. Rotation of a sartorius muscle flap for coverage of the existing bypass grafts. Placement of a nondisposable wound VAC dressing left groin. Redo vascular surgery - Drs. [name] and Dew 06/26/23: Excisional debridement left groin wound. Placement of a nondisposable VAC dressing - Dr Jama 06/29/23: Left groin VAC dressing change - Dr Jama   07/02/23: Irrigation and debridement of skin, soft tissue, and muscle for approximately 125 cm to the left groin wound, Pulse lavage irrigation left groin. Left groin VAC dressing change - Dr Marea      Had another wound VAC exchange today  ASSESSMENT & PLAN:   Left groin abscess Sepsis with septic shock resolved Status post I&D with vascular surgery 12/18 with takeback to the OR for left groin debridement, muscle flap, wound VAC exchange 12/20, wound vac exchange 12/23 ID on board and has recommended antibiotics with end date on 07/22/2023, she will complete with Unasyn  followed by Augmentin . Pain control Patient underwent wound VAC exchange on 07/06/2023 followed by 07/12/2024, 1/9 , 1/13 Follow vascular surgery recommendations   PAD s/p of left common femoral-tibioperoneal trunk bypass, thrombectomy, angioplasty and stent placement 04/2023  on aspirin , Plavix  and Eliquis   Continue eliquis  and aspirin  Continue statin therapy Continue aspirin    Leukocytosis-resolved Continue current antibiotics   Normocytic anemia Expected ABLA d/t surgeries normal hemoglobin prior  to vascular procedures.   some blood tinge output in her wound VAC S/p 1 unit of packed red blood cells by vascular team.  Hemoglobin at 7.8 today -Starting on supplement -Continue to monitor -Transfuse  below 7  AKI - resolved Likely d/t septic shock resolved Continue to monitor renal function   Hypokalemia D/t Lasix   Replace as needed Monitor BMP   PVC Patient noted to have frequent PVCs on tele This is likely causing her heart rate to appear to be lower than normal.  Will ensure potassium >4, Magnesium >2 Continue to monitor   Hyponatremia  Resolved Monitor sodium level   Chronic diastolic HF Edema bilateral LE Monitor fluid status  Diuresis prn  Monitor BMP w/ diuresis   Hypoalbuminemia  Encourage po intake    HTN  HLD Continue statin Hold home antihypertensives. Mostly normotensive off of midodrine  Continue to monitor blood pressure   T2DM  A1c 8, CBG elevated this morning after decreasing the dose of Semglee  Increase Semglee  to 12 units at night Continue mealtime 3 units  daily Continue with sensitive SSI       DVT prophylaxis: ELiquis     Central lines / invasive devices: wound vac    Code Status: FULL CODE   TOC needs: home health  Barriers to dispo / significant pending items: wound vac in place, iv abx, planning wound vac change in OR      Subjective Patient was seen after another wound VAC exchange today.  No new concern.   Family Communication:      Physical Exam General.  Well-developed lady, in no acute distress. Pulmonary.  Lungs clear bilaterally, normal respiratory effort. CV.  Regular rate and rhythm, no JVD, rub or murmur. Abdomen.  Soft, nontender, nondistended, BS positive. CNS.  Alert and oriented .  No focal neurologic deficit. Extremities.  1+ LLE, wound VAC at left groin Psychiatry.  Judgment and insight appears normal.      Data Reviewed:     Latest Ref Rng & Units 07/20/2023    4:08 AM 07/17/2023    5:00 AM 07/10/2023    5:06 AM  BMP  Glucose 70 - 99 mg/dL 816  877  54   BUN 8 - 23 mg/dL 22  16  16    Creatinine 0.44 - 1.00 mg/dL 9.12  9.26  9.36   Sodium 135 - 145 mmol/L 137  136  137   Potassium 3.5 - 5.1 mmol/L  4.0  3.4  3.5   Chloride 98 - 111 mmol/L 103  100  102   CO2 22 - 32 mmol/L 25  26  27    Calcium  8.9 - 10.3 mg/dL 8.2  8.2  7.7     Vitals:   07/20/23 0830 07/20/23 0845 07/20/23 0902 07/20/23 1009  BP: (!) 132/54 (!) 131/48 (!) 129/55 (!) 143/52  Pulse: 64 63 63 63  Resp: 17 18 18 18   Temp: 98.5 F (36.9 C) 98.1 F (36.7 C) (!) 97.5 F (36.4 C) 97.7 F (36.5 C)  TempSrc:   Oral Oral  SpO2: 96% 93% 93% 92%  Weight:      Height:          Latest Ref Rng & Units 07/20/2023    4:08 AM 07/17/2023    5:00 AM 07/10/2023    5:06 AM  CBC  WBC 4.0 - 10.5 K/uL 10.3  8.8  8.7   Hemoglobin 12.0 - 15.0 g/dL 9.4  9.1  7.8   Hematocrit 36.0 - 46.0 %  28.5  28.3  23.2   Platelets 150 - 400 K/uL 456  503  338      Author: Amaryllis Dare, MD 07/20/2023 1:58 PM  For on call review [email or web address].

## 2023-07-20 NOTE — Interval H&P Note (Signed)
 History and Physical Interval Note:  07/20/2023 6:59 AM  Laura Reed  has presented today for surgery, with the diagnosis of LEFT GROIN INFECTION.  The various methods of treatment have been discussed with the patient and family. After consideration of risks, benefits and other options for treatment, the patient has consented to  Procedure(s): WOUND VAC EXCHANGE (Left) as a surgical intervention.  The patient's history has been reviewed, patient examined, no change in status, stable for surgery.  I have reviewed the patient's chart and labs.  Questions were answered to the patient's satisfaction.     Cordella Shawl

## 2023-07-20 NOTE — Anesthesia Postprocedure Evaluation (Signed)
 Anesthesia Post Note  Patient: Laura Reed  Procedure(s) Performed: WOUND VAC EXCHANGE (Left)  Patient location during evaluation: PACU Anesthesia Type: General Level of consciousness: awake and alert Pain management: pain level controlled Vital Signs Assessment: post-procedure vital signs reviewed and stable Respiratory status: spontaneous breathing, nonlabored ventilation, respiratory function stable and patient connected to nasal cannula oxygen Cardiovascular status: blood pressure returned to baseline and stable Postop Assessment: no apparent nausea or vomiting Anesthetic complications: no   No notable events documented.   Last Vitals:  Vitals:   07/20/23 0902 07/20/23 1009  BP: (!) 129/55 (!) 143/52  Pulse: 63 63  Resp: 18 18  Temp: (!) 36.4 C 36.5 C  SpO2: 93% 92%    Last Pain:  Vitals:   07/20/23 1009  TempSrc: Oral  PainSc:                  Prentice Murphy

## 2023-07-20 NOTE — Progress Notes (Signed)
 ID Went for Wound vac change today   O/e awake and alert Looks well Patient Vitals for the past 24 hrs:  BP Temp Temp src Pulse Resp SpO2 Height Weight  07/20/23 1009 (!) 143/52 97.7 F (36.5 C) Oral 63 18 92 % -- --  07/20/23 0902 (!) 129/55 (!) 97.5 F (36.4 C) Oral 63 18 93 % -- --  07/20/23 0845 (!) 131/48 98.1 F (36.7 C) -- 63 18 93 % -- --  07/20/23 0830 (!) 132/54 98.5 F (36.9 C) -- 64 17 96 % -- --  07/20/23 0815 (!) 127/44 -- -- 64 14 100 % -- --  07/20/23 0808 (!) 127/48 99 F (37.2 C) -- 65 -- 100 % -- --  07/20/23 0658 (!) 159/61 97.6 F (36.4 C) Oral 70 18 95 % 5' 7 (1.702 m) 74.1 kg  07/19/23 2357 (!) 118/38 98.1 F (36.7 C) Oral 72 18 95 % -- --  07/19/23 1541 (!) 150/54 98.1 F (36.7 C) Oral 68 14 94 % -- --   Left    groin wound  picture reviewed  Edema legs- left > rt much improved Left TMA  CNS non focal   Labs    Latest Ref Rng & Units 07/20/2023    4:08 AM 07/17/2023    5:00 AM 07/10/2023    5:06 AM  CBC  WBC 4.0 - 10.5 K/uL 10.3  8.8  8.7   Hemoglobin 12.0 - 15.0 g/dL 9.4  9.1  7.8   Hematocrit 36.0 - 46.0 % 28.5  28.3  23.2   Platelets 150 - 400 K/uL 456  503  338        Latest Ref Rng & Units 07/20/2023    4:08 AM 07/17/2023    5:00 AM 07/10/2023    5:06 AM  CMP  Glucose 70 - 99 mg/dL 816  877  54   BUN 8 - 23 mg/dL 22  16  16    Creatinine 0.44 - 1.00 mg/dL 9.12  9.26  9.36   Sodium 135 - 145 mmol/L 137  136  137   Potassium 3.5 - 5.1 mmol/L 4.0  3.4  3.5   Chloride 98 - 111 mmol/L 103  100  102   CO2 22 - 32 mmol/L 25  26  27    Calcium  8.9 - 10.3 mg/dL 8.2  8.2  7.7     Micro Groin abscess- Ecoli  Impression/recommendation PAD s/p left common femoral artery to tibioperoneal trunk artery bypass  with PTFE graft in OCT 2024   Abscess due to Ecoli  at the surgical site- CT showed  10 cm complex collection s/p excisional debridement of the abscess and rotation of a sarorius muscle flap for coverage of existing bypass grafts  06/24/23 Repeat culture from 12/26 is negative     As vascular graft  exposed beng treated like endovascular infection with  IV antibiotic for 4 weeks until 07/22/23 followed by po augmentin  for 2 weeks until 08/06/23  Leucocytosis-resolved  Candidiasis of the skin  around wound- started fluconazole    completed 5 day course    Left TMA- healed well  ecoli and enterococcus in  culture in sept when she had TMA   CAD s/p CABG   H/o bladder ca s/p left nephrectomy  Solitary kidney- avoid nephrotoxic drugs   AKI-  resolved  Discussed the management with patient and vascular team and hospitalist   ID will sign off- call if needed

## 2023-07-20 NOTE — Inpatient Diabetes Management (Signed)
 Inpatient Diabetes Program Recommendations  AACE/ADA: New Consensus Statement on Inpatient Glycemic Control  Target Ranges:  Prepandial:   less than 140 mg/dL      Peak postprandial:   less than 180 mg/dL (1-2 hours)      Critically ill patients:  140 - 180 mg/dL    Latest Reference Range & Units 07/19/23 07:55 07/19/23 12:41 07/19/23 13:01 07/19/23 13:32 07/19/23 16:45 07/19/23 20:25  Glucose-Capillary 70 - 99 mg/dL 86 53 (L) 57 (L) 75 761 (H) 174 (H)   Review of Glycemic Control  Diabetes history: DM2 Outpatient Diabetes medications: Tresiba  22 units daily, Humalog  10 units TID, Jardiance  25 mg daily Current orders for Inpatient glycemic control: Semglee  15 units at bedtime, Novolog  0-9 units TID with meals, Novolog  4 units TID with meals  Inpatient Diabetes Program Recommendations:    Insulin : Fasting CBG 86 mg/dl on 8/87/74 and CBG down to 53 mg/dl at 87:58 on 8/87/74. Question if patient got breakfast meal coverage insulin  but did not eat at least 50% of meal.   Please consider decreasing Semglee  to 12 units at bedtime.  NURSING: Be sure patient is eating at least 50% before giving meal coverage insulin . Patient is NPO today so Novolog  4 units TID (meal coverage) should not be given until diet is resumed and patient is eating at least 50% of meals.  Thanks, Earnie Gainer, RN, MSN, CDCES Diabetes Coordinator Inpatient Diabetes Program [phone] (Team Pager from 8am to 5pm)

## 2023-07-21 ENCOUNTER — Encounter: Payer: Self-pay | Admitting: Vascular Surgery

## 2023-07-21 DIAGNOSIS — D72829 Elevated white blood cell count, unspecified: Secondary | ICD-10-CM | POA: Diagnosis not present

## 2023-07-21 DIAGNOSIS — N179 Acute kidney failure, unspecified: Secondary | ICD-10-CM | POA: Diagnosis not present

## 2023-07-21 DIAGNOSIS — I739 Peripheral vascular disease, unspecified: Secondary | ICD-10-CM | POA: Diagnosis not present

## 2023-07-21 DIAGNOSIS — M79605 Pain in left leg: Secondary | ICD-10-CM | POA: Diagnosis not present

## 2023-07-21 LAB — GLUCOSE, CAPILLARY
Glucose-Capillary: 278 mg/dL — ABNORMAL HIGH (ref 70–99)
Glucose-Capillary: 209 mg/dL — ABNORMAL HIGH (ref 70–99)
Glucose-Capillary: 134 mg/dL — ABNORMAL HIGH (ref 70–99)
Glucose-Capillary: 195 mg/dL — ABNORMAL HIGH (ref 70–99)

## 2023-07-21 MED ORDER — INSULIN GLARGINE-YFGN 100 UNIT/ML ~~LOC~~ SOLN
20.0000 [IU] | Freq: Every day | SUBCUTANEOUS | Status: DC
  Administered 2023-07-21 – 2023-07-22 (×2): 20 [IU] via SUBCUTANEOUS
  Filled 2023-07-21 (×2): qty 0.2

## 2023-07-21 NOTE — Progress Notes (Signed)
 Physical Therapy Treatment Patient Details Name: Laura Reed MRN: [id number] DOB: 02-04-1961 Today's Date: 07/21/2023   History of Present Illness SYAN CULLIMORE  has presented today for surgery, with the diagnosis of Groin Infection. S/P I & D left groin. On April 29, 2023 she underwent a femoral to tibioperoneal trunk bypass using a PTFE distal flow graft.  She was noted to thrombose within the first 48 hours and subsequently underwent angiography with salvage of her bypass and stenting of the anterior tibial artery.  Following the second procedure which was May 01, 2023 she did well and was discharged to home approximately 10 days later. Wound vac change 12/23.  S/p 07/06/23 wound VAC washout and change.  S/p 07/09/23 I&D of skin, soft tissue, and muscle (for approximately 75 cm2) to L groin; application of Kerecis biologic to wound to L groin; and placement of nondisposable negative pressure dressing.    PT Comments  Pt is making good progress towards goals with ability to ambulate around nurses station with safe technique. Long conversation with pt regarding AD. Pt feels most secure and indep with use of RW. Pt will need RW for home discharge. Stair training performed this date with pt confident about enter/exiting home environment. Recommend 1 more session for further review and then possible dc from therapy services and continue with mobility specialists to prevent deconditioning. Will continue to progress.    If plan is discharge home, recommend the following: A little help with walking and/or transfers;A little help with bathing/dressing/bathroom;Assistance with cooking/housework;Help with stairs or ramp for entrance;Assist for transportation   Can travel by private vehicle        Equipment Recommendations  Rolling walker (2 wheels)    Recommendations for Other Services       Precautions / Restrictions Precautions Precautions: Fall Precaution Comments: wound  vac Restrictions Weight Bearing Restrictions Per Provider Order: No     Mobility  Bed Mobility Overal bed mobility: Independent Bed Mobility: Supine to Sit     Supine to sit: Independent     General bed mobility comments: Safe technique. Once seated at EOB, upright posture. Does need assist to don shoes/post op shoe at bedside    Transfers Overall transfer level: Modified independent Equipment used: Rolling walker (2 wheels) Transfers: Sit to/from Stand Sit to Stand: Modified independent (Device/Increase time)           General transfer comment: safe technique with upright posture. RW used    Ambulation/Gait Ambulation/Gait assistance: Modified independent (Device/Increase time) Gait Distance (Feet): 500 Feet Assistive device: Rolling walker (2 wheels), 1 person hand held assist Gait Pattern/deviations: Step-through pattern       General Gait Details: Pt ambulated 2 laps in hallway in addition to therapy gym. Reciprocal gait pattern with slow speed. Improved posture.   Stairs Stairs: Yes Stairs assistance: Contact guard assist Stair Management: Two rails, Step to pattern Number of Stairs: 4 General stair comments: up/down  with step to gait pattern. Safe technique.   Wheelchair Mobility     Tilt Bed    Modified Rankin (Stroke Patients Only)       Balance Overall balance assessment: Needs assistance Sitting-balance support: Feet supported, No upper extremity supported Sitting balance-Leahy Scale: Good     Standing balance support: No upper extremity supported, During functional activity Standing balance-Leahy Scale: Good  Cognition Arousal: Alert Behavior During Therapy: WFL for tasks assessed/performed Overall Cognitive Status: Within Functional Limits for tasks assessed                                 General Comments: Pt is A and O x 4. Cooperative and pleasant        Exercises       General Comments        Pertinent Vitals/Pain Pain Assessment Pain Assessment: No/denies pain    Home Living                          Prior Function            PT Goals (current goals can now be found in the care plan section) Acute Rehab PT Goals Patient Stated Goal: to return home PT Goal Formulation: With patient Time For Goal Achievement: 07/24/23 Potential to Achieve Goals: Good Progress towards PT goals: Progressing toward goals    Frequency    Min 1X/week      PT Plan      Co-evaluation              AM-PAC PT 6 Clicks Mobility   Outcome Measure  Help needed turning from your back to your side while in a flat bed without using bedrails?: None Help needed moving from lying on your back to sitting on the side of a flat bed without using bedrails?: None Help needed moving to and from a bed to a chair (including a wheelchair)?: None Help needed standing up from a chair using your arms (e.g., wheelchair or bedside chair)?: None Help needed to walk in hospital room?: A Little Help needed climbing 3-5 steps with a railing? : A Little 6 Click Score: 22    End of Session   Activity Tolerance: Patient tolerated treatment well Patient left:  (left seated at EOB with CNA) Nurse Communication: Mobility status;Precautions PT Visit Diagnosis: Other abnormalities of gait and mobility (R26.89);Unsteadiness on feet (R26.81);Pain;Difficulty in walking, not elsewhere classified (R26.2);Muscle weakness (generalized) (M62.81)     Time: 8877-8849 PT Time Calculation (min) (ACUTE ONLY): 28 min  Charges:    $Gait Training: 23-37 mins PT General Charges $$ ACUTE PT VISIT: 1 Visit                     Corean Dade, PT, DPT, GCS [phone]    Alphus Zeck 07/21/2023, 12:36 PM

## 2023-07-21 NOTE — TOC Progression Note (Signed)
 Transition of Care [facility]) - Progression Note    Patient Details  Name: Laura Reed MRN: [id number] Date of Birth: 1960/09/08  [facility] CM/SW Contact  Royanne JINNY Bernheim, RN Phone Number: 07/21/2023, 10:33 AM  Clinical Narrative:      Vascular surgery plans on taking the patient back to the operating room on 07/23/23 for another wound VAC change out and assessment of the sartorius flap in her left groin.  TOC continues to follow the patient and will assist with DC planning   Barriers to Discharge: No Home Care Agency will accept this patient  Expected Discharge Plan and Services                                               Social Determinants of Health (SDOH) Interventions SDOH Screenings   Food Insecurity: No Food Insecurity (06/23/2023)  Recent Concern: Food Insecurity - Food Insecurity Present (05/26/2023)   Received from [facility]  Housing: Low Risk  (06/23/2023)  Transportation Needs: No Transportation Needs (06/23/2023)  Utilities: Not At Risk (06/23/2023)  Alcohol Screen: Low Risk  (06/17/2023)  Depression (PHQ2-9): Low Risk  (06/17/2023)  Financial Resource Strain: Low Risk  (06/21/2023)   Received from [facility]  Physical Activity: Inactive (06/17/2023)  Social Connections: Socially Isolated (06/17/2023)  Stress: Stress Concern Present (06/17/2023)  Tobacco Use: Medium Risk (07/20/2023)  Health Literacy: Inadequate Health Literacy (06/17/2023)    Readmission Risk Interventions    06/22/2023   11:01 AM  Readmission Risk Prevention Plan  Transportation Screening Complete  PCP or Specialist Appt within 3-5 Days Complete  Social Work Consult for Recovery Care Planning/Counseling Complete  Palliative Care Screening Not Applicable  Medication Review Oceanographer) Complete

## 2023-07-21 NOTE — Progress Notes (Signed)
 Progress Note   Patient: Laura Reed FMW:[id number] DOB: 23-Jul-1960 DOA: 06/21/2023     30 DOS: the patient was seen and examined on 07/21/2023      Brief hospital course:  HPI: 63 y.o female with significant PMH of HTN, HLD, DM, sCHF, CAD, PAD (s/p of left common femoral-distal bypass, thrombectomy, angioplasty and stent placement 04/2023 on aspirin , Plavix  and Eliquis ), anxiety, bladder cancer (s/p of nephrectomy). [facility] on 06/21/23 with complaints of right groin pain, redness and oozing post op. Patient has hx of PAD and recently underwent left common femoral-distal bypass, thrombectomy, angioplasty and stent placement 04/2023 and right transmetatarsal amputation. SBP in the 50s and was sent to the ED for further evaluation.  Patient was initially admitted to hospitalist service later transferred to ICU for pressor support.  Patient has undergone multiple trips to the OR for wound debridement and wound VAC exchange.  1/3: Hemodynamically stable.  Going back to the OR on Monday.  Patient will need need wound VAC for many weeks. TOC having issues finding and home health agency.  1/4: Had an episode of hypoglycemia earlier this morning.  Requiring intervention.  Semglee  dose was decreased to 10 from 15 at night, also making sensitive SSI.  1/5: CBG elevated this morning so increasing the dose of Semglee  to 12 units.  Going for another wound VAC change with vascular surgery tomorrow, n.p.o. after midnight.  1/6: Patient had another wound VAC exchange and further debridement in OR with vascular surgery today.  1/7: Remained hemodynamically stable, going back to the OR on 07/16/2023  1/8: Hemodynamically stable, going back to the OR tomorrow  1/9: Remained stable, going back to the OR with vascular today  1/10: Hemodynamically stable.  Going back to the OR on Monday.  1/12: Patient had another wound VAC exchange in OR today.  Wound seems healing well.  Will need another wound  exchange within a week.  Patient will continue with IV antibiotics until 1/15, that will complete 4 weeks followed by Augmentin  per ID.  1/14: Slowly making progress, going back to or on Thursday.     Consultants:  [facility] Surgery  Infectious disease    Procedures/Surgeries: 06/24/23: Excisional debridement of left groin abscess. Rotation of a sartorius muscle flap for coverage of the existing bypass grafts. Placement of a nondisposable wound VAC dressing left groin. Redo vascular surgery - Drs. [name] and Dew 06/26/23: Excisional debridement left groin wound. Placement of a nondisposable VAC dressing - Dr Jama 06/29/23: Left groin VAC dressing change - Dr Jama   07/02/23: Irrigation and debridement of skin, soft tissue, and muscle for approximately 125 cm to the left groin wound, Pulse lavage irrigation left groin. Left groin VAC dressing change - Dr Marea      Had another wound VAC exchange today Going back to the OR on Thursday, 1/16  ASSESSMENT & PLAN:   Left groin abscess Sepsis with septic shock resolved Status post I&D with vascular surgery 12/18 with takeback to the OR for left groin debridement, muscle flap, wound VAC exchange 12/20, wound vac exchange 12/23 ID on board and has recommended antibiotics with end date on 07/22/2023, she will complete with Unasyn  followed by Augmentin . Pain control Patient underwent wound VAC exchange on 07/06/2023 followed by 07/12/2024, 1/9 , 1/13 Follow vascular surgery recommendations   PAD s/p of left common femoral-tibioperoneal trunk bypass, thrombectomy, angioplasty and stent placement 04/2023  on aspirin , Plavix  and Eliquis   Continue eliquis  and aspirin  Continue statin therapy  Continue aspirin    Leukocytosis-resolved Continue current antibiotics   Normocytic anemia Expected ABLA d/t surgeries normal hemoglobin prior to vascular procedures.   some blood tinge output in her wound VAC S/p 1 unit of packed red blood cells  by vascular team.  Hemoglobin at 7.8 today -Starting on supplement -Continue to monitor -Transfuse below 7  AKI - resolved Likely d/t septic shock resolved Continue to monitor renal function   Hypokalemia D/t Lasix   Replace as needed Monitor BMP   PVC Patient noted to have frequent PVCs on tele This is likely causing her heart rate to appear to be lower than normal.  Will ensure potassium >4, Magnesium >2 Continue to monitor   Hyponatremia  Resolved Monitor sodium level   Chronic diastolic HF Edema bilateral LE Monitor fluid status  Diuresis prn  Monitor BMP w/ diuresis   Hypoalbuminemia  Encourage po intake    HTN  HLD Continue statin Hold home antihypertensives. Mostly normotensive off of midodrine  Continue to monitor blood pressure   T2DM  A1c 8, CBG elevated this morning after decreasing the dose of Semglee  Increase Semglee  to 12 units at night Continue mealtime 3 units  daily Continue with sensitive SSI       DVT prophylaxis: ELiquis     Central lines / invasive devices: wound vac    Code Status: FULL CODE   TOC needs: home health  Barriers to dispo / significant pending items: wound vac in place, iv abx, planning wound vac change in OR      Subjective Patient with no new concerns today.   Family Communication:      Physical Exam General.  Well-developed lady, in no acute distress. Pulmonary.  Lungs clear bilaterally, normal respiratory effort. CV.  Regular rate and rhythm, no JVD, rub or murmur. Abdomen.  Soft, nontender, nondistended, BS positive. CNS.  Alert and oriented .  No focal neurologic deficit. Extremities.  Trace LLE edema, left groin with wound VAC Psychiatry.  Judgment and insight appears normal.     Data Reviewed:     Latest Ref Rng & Units 07/20/2023    4:08 AM 07/17/2023    5:00 AM 07/10/2023    5:06 AM  BMP  Glucose 70 - 99 mg/dL 816  877  54   BUN 8 - 23 mg/dL 22  16  16    Creatinine 0.44 - 1.00 mg/dL 9.12  9.26   9.36   Sodium 135 - 145 mmol/L 137  136  137   Potassium 3.5 - 5.1 mmol/L 4.0  3.4  3.5   Chloride 98 - 111 mmol/L 103  100  102   CO2 22 - 32 mmol/L 25  26  27    Calcium  8.9 - 10.3 mg/dL 8.2  8.2  7.7     Vitals:   07/20/23 1603 07/20/23 1933 07/20/23 2338 07/21/23 0854  BP: (!) 89/72 (!) 132/48 (!) 134/52 (!) 147/63  Pulse: 67 66 70 71  Resp: 18 16 16 16   Temp: 98.4 F (36.9 C) 98.1 F (36.7 C) 98.3 F (36.8 C) 98.6 F (37 C)  TempSrc: Oral     SpO2: 93% 96% 91% 94%  Weight:      Height:          Latest Ref Rng & Units 07/20/2023    4:08 AM 07/17/2023    5:00 AM 07/10/2023    5:06 AM  CBC  WBC 4.0 - 10.5 K/uL 10.3  8.8  8.7   Hemoglobin 12.0 - 15.0 g/dL  9.4  9.1  7.8   Hematocrit 36.0 - 46.0 % 28.5  28.3  23.2   Platelets 150 - 400 K/uL 456  503  338      Author: Amaryllis Dare, MD 07/21/2023 3:38 PM  For on call review [email or web address].

## 2023-07-21 NOTE — Progress Notes (Signed)
 Progress Note    07/21/2023 10:03 AM 1 Day Post-Op  Subjective:   Subjective:  Laura Reed is a 63 year old female now status postop day 1 from wound VAC washout and change in the operating room. Wound VAC working well with noted 40 cc of serous drainage to the canister. No complaints overnight. Patient continues to work with physical therapy. Vitals are remained stable.    Vitals:   07/20/23 2338 07/21/23 0854  BP: (!) 134/52 (!) 147/63  Pulse: 70 71  Resp: 16 16  Temp: 98.3 F (36.8 C) 98.6 F (37 C)  SpO2: 91% 94%   Physical Exam: Cardiac:  RRR, Normal S1,S2. No murmurs appreciated.  Bradycardia resolved. Lungs: Clear on auscultation throughout but remains diminished in the bases.  Nonlabored breathing.  Nonproductive cough this morning.  Instructed to use bedside incentive spirometry. Incisions: Left groin with wound VAC in place.  Working well. New Yeast infection to surrounding tissue of wound bed.  Extremities: Bilateral lower extremities warm to touch.  Left lower extremity with +2 edema.  Able to palpate pulses but has positive Doppler pulses. Abdomen: Positive bowel sounds throughout, soft, nontender nondistended. Neurologic: Alert and oriented x 3, answers all questions and follows commands appropriately.  CBC    Component Value Date/Time   WBC 10.3 07/20/2023 0408   RBC 3.27 (L) 07/20/2023 0408   HGB 9.4 (L) 07/20/2023 0408   HGB 9.5 (L) 11/02/2014 0609   HCT 28.5 (L) 07/20/2023 0408   HCT 29.5 (L) 11/02/2014 0609   PLT 456 (H) 07/20/2023 0408   PLT 217 11/02/2014 0609   MCV 87.2 07/20/2023 0408   MCV 87 11/02/2014 0609   MCH 28.7 07/20/2023 0408   MCHC 33.0 07/20/2023 0408   RDW 15.4 07/20/2023 0408   RDW 13.6 11/02/2014 0609   LYMPHSABS 1.3 07/10/2023 0506   LYMPHSABS 1.2 11/02/2014 0609   MONOABS 0.8 07/10/2023 0506   MONOABS 1.1 (H) 11/02/2014 0609   EOSABS 0.2 07/10/2023 0506   EOSABS 0.3 11/02/2014 0609   BASOSABS 0.1 07/10/2023 0506    BASOSABS 0.0 11/02/2014 0609    BMET    Component Value Date/Time   NA 137 07/20/2023 0408   NA 136 08/02/2021 1629   NA 135 11/02/2014 0609   K 4.0 07/20/2023 0408   K 4.2 11/02/2014 0609   CL 103 07/20/2023 0408   CL 107 11/02/2014 0609   CO2 25 07/20/2023 0408   CO2 23 11/02/2014 0609   GLUCOSE 183 (H) 07/20/2023 0408   GLUCOSE 108 (H) 11/02/2014 0609   BUN 22 07/20/2023 0408   BUN 25 08/02/2021 1629   BUN 20 11/02/2014 0609   CREATININE 0.87 07/20/2023 0408   CREATININE 1.01 11/09/2015 1549   CREATININE 1.01 11/09/2015 1549   CALCIUM  8.2 (L) 07/20/2023 0408   CALCIUM  7.3 (L) 11/02/2014 0609   GFRNONAA >60 07/20/2023 0408   GFRNONAA 50 (L) 11/02/2014 0609   GFRAA >60 01/19/2019 0357   GFRAA 58 (L) 11/02/2014 0609    INR    Component Value Date/Time   INR 1.1 06/21/2023 2304   INR 0.9 10/17/2014 1131     Intake/Output Summary (Last 24 hours) at 07/21/2023 1003 Last data filed at 07/21/2023 0455 Gross per 24 hour  Intake 400 ml  Output --  Net 400 ml     Assessment/Plan:  63 y.o. female is s/p wound VAC washout with wound VAC change.  1 Day Post-Op   Vascular surgery plans on taking the patient back  to the operating room on 07/23/23 for another wound VAC change out and assessment of the sartorius flap in her left groin.  Patient was reminded she would be made n.p.o. after midnight on 07/23/23.  She verbalized her understanding.  Wishes to proceed.    Dr. Cathlyn Shawl MD is made aware of the plan and he agrees with plan.   DVT prophylaxis: Eliquis  2.5 twice daily, aspirin  81 mg daily, and Lipitor 20 daily   [facility] 07/21/2023 10:03 AM

## 2023-07-22 DIAGNOSIS — M79605 Pain in left leg: Secondary | ICD-10-CM | POA: Diagnosis not present

## 2023-07-22 LAB — GLUCOSE, CAPILLARY
Glucose-Capillary: 171 mg/dL — ABNORMAL HIGH (ref 70–99)
Glucose-Capillary: 64 mg/dL — ABNORMAL LOW (ref 70–99)
Glucose-Capillary: 63 mg/dL — ABNORMAL LOW (ref 70–99)
Glucose-Capillary: 73 mg/dL (ref 70–99)
Glucose-Capillary: 180 mg/dL — ABNORMAL HIGH (ref 70–99)
Glucose-Capillary: 158 mg/dL — ABNORMAL HIGH (ref 70–99)

## 2023-07-22 MED ORDER — SODIUM CHLORIDE 0.9 % IV SOLN
INTRAVENOUS | Status: DC
Start: 2023-07-22 — End: 2023-07-23

## 2023-07-22 MED ORDER — CHLORHEXIDINE GLUCONATE 4 % EX SOLN
60.0000 mL | Freq: Once | CUTANEOUS | Status: AC
Start: 2023-07-23 — End: 2023-07-23
  Administered 2023-07-23: 4 via TOPICAL

## 2023-07-22 MED ORDER — LOPERAMIDE HCL 2 MG PO CAPS
2.0000 mg | ORAL_CAPSULE | Freq: Once | ORAL | Status: AC
  Administered 2023-07-22: 2 mg via ORAL
  Filled 2023-07-22: qty 1

## 2023-07-22 MED ORDER — CHLORHEXIDINE GLUCONATE 4 % EX SOLN
60.0000 mL | Freq: Once | CUTANEOUS | Status: AC
  Administered 2023-07-22: 4 via TOPICAL

## 2023-07-22 NOTE — H&P (View-Only) (Signed)
Progress Note    07/22/2023 12:19 PM 2 Days Post-Op  Subjective:    Laura Reed is a 63 year old female now status postop day 2 from wound VAC washout and change in the operating room. Wound VAC working well with noted 40 cc of serous drainage to the canister. No complaints overnight. Patient continues to work with physical therapy. Vitals are remained stable.    Vitals:   07/21/23 2336 07/22/23 0754  BP: (!) 137/56 (!) 138/49  Pulse: 61 (!) 59  Resp: 16 16  Temp: 98.5 F (36.9 C) 97.9 F (36.6 C)  SpO2: 95% 98%   Physical Exam: Cardiac:  RRR, Normal S1,S2. No murmurs appreciated.  Bradycardia resolved. Lungs: Clear on auscultation throughout but remains diminished in the bases.  Nonlabored breathing.  Nonproductive cough this morning.  Instructed to use bedside incentive spirometry. Incisions: Left groin with wound VAC in place.  Working well. New Yeast infection to surrounding tissue of wound bed.  Extremities: Bilateral lower extremities warm to touch.  Left lower extremity with +2 edema.  Able to palpate pulses but has positive Doppler pulses. Abdomen: Positive bowel sounds throughout, soft, nontender nondistended. Neurologic: Alert and oriented x 3, answers all questions and follows commands appropriately  CBC    Component Value Date/Time   WBC 10.3 07/20/2023 0408   RBC 3.27 (L) 07/20/2023 0408   HGB 9.4 (L) 07/20/2023 0408   HGB 9.5 (L) 11/02/2014 0609   HCT 28.5 (L) 07/20/2023 0408   HCT 29.5 (L) 11/02/2014 0609   PLT 456 (H) 07/20/2023 0408   PLT 217 11/02/2014 0609   MCV 87.2 07/20/2023 0408   MCV 87 11/02/2014 0609   MCH 28.7 07/20/2023 0408   MCHC 33.0 07/20/2023 0408   RDW 15.4 07/20/2023 0408   RDW 13.6 11/02/2014 0609   LYMPHSABS 1.3 07/10/2023 0506   LYMPHSABS 1.2 11/02/2014 0609   MONOABS 0.8 07/10/2023 0506   MONOABS 1.1 (H) 11/02/2014 0609   EOSABS 0.2 07/10/2023 0506   EOSABS 0.3 11/02/2014 0609   BASOSABS 0.1 07/10/2023 0506   BASOSABS 0.0  11/02/2014 0609    BMET    Component Value Date/Time   NA 137 07/20/2023 0408   NA 136 08/02/2021 1629   NA 135 11/02/2014 0609   K 4.0 07/20/2023 0408   K 4.2 11/02/2014 0609   CL 103 07/20/2023 0408   CL 107 11/02/2014 0609   CO2 25 07/20/2023 0408   CO2 23 11/02/2014 0609   GLUCOSE 183 (H) 07/20/2023 0408   GLUCOSE 108 (H) 11/02/2014 0609   BUN 22 07/20/2023 0408   BUN 25 08/02/2021 1629   BUN 20 11/02/2014 0609   CREATININE 0.87 07/20/2023 0408   CREATININE 1.01 11/09/2015 1549   CREATININE 1.01 11/09/2015 1549   CALCIUM 8.2 (L) 07/20/2023 0408   CALCIUM 7.3 (L) 11/02/2014 0609   GFRNONAA >60 07/20/2023 0408   GFRNONAA 50 (L) 11/02/2014 0609   GFRAA >60 01/19/2019 0357   GFRAA 58 (L) 11/02/2014 0609    INR    Component Value Date/Time   INR 1.1 06/21/2023 2304   INR 0.9 10/17/2014 1131     Intake/Output Summary (Last 24 hours) at 07/22/2023 1219 Last data filed at 07/22/2023 2725 Gross per 24 hour  Intake 663.53 ml  Output --  Net 663.53 ml     Assessment/Plan:  63 y.o. female is s/p wound VAC washout with wound VAC change.  2 Days Post-Op   PLAN Vascular surgery plans on taking the patient  back to the operating room on 07/23/23 for another wound VAC change out and assessment of the sartorius flap in her left groin.  Patient was reminded she would be made n.p.o. after midnight on 07/23/23.  She verbalized her understanding.  Wishes to proceed.    Dr. Vilinda Flake MD is made aware of the plan and he agrees with plan.   DVT prophylaxis: Eliquis 2.5 twice daily, aspirin 81 mg daily, and Lipitor 20 daily   Marcie Bal [facility] 07/22/2023 12:19 PM

## 2023-07-22 NOTE — Progress Notes (Signed)
 Progress Note    07/22/2023 12:19 PM 2 Days Post-Op  Subjective:    Laura Reed is a 63 year old female now status postop day 2 from wound VAC washout and change in the operating room. Wound VAC working well with noted 40 cc of serous drainage to the canister. No complaints overnight. Patient continues to work with physical therapy. Vitals are remained stable.    Vitals:   07/21/23 2336 07/22/23 0754  BP: (!) 137/56 (!) 138/49  Pulse: 61 (!) 59  Resp: 16 16  Temp: 98.5 F (36.9 C) 97.9 F (36.6 C)  SpO2: 95% 98%   Physical Exam: Cardiac:  RRR, Normal S1,S2. No murmurs appreciated.  Bradycardia resolved. Lungs: Clear on auscultation throughout but remains diminished in the bases.  Nonlabored breathing.  Nonproductive cough this morning.  Instructed to use bedside incentive spirometry. Incisions: Left groin with wound VAC in place.  Working well. New Yeast infection to surrounding tissue of wound bed.  Extremities: Bilateral lower extremities warm to touch.  Left lower extremity with +2 edema.  Able to palpate pulses but has positive Doppler pulses. Abdomen: Positive bowel sounds throughout, soft, nontender nondistended. Neurologic: Alert and oriented x 3, answers all questions and follows commands appropriately  CBC    Component Value Date/Time   WBC 10.3 07/20/2023 0408   RBC 3.27 (L) 07/20/2023 0408   HGB 9.4 (L) 07/20/2023 0408   HGB 9.5 (L) 11/02/2014 0609   HCT 28.5 (L) 07/20/2023 0408   HCT 29.5 (L) 11/02/2014 0609   PLT 456 (H) 07/20/2023 0408   PLT 217 11/02/2014 0609   MCV 87.2 07/20/2023 0408   MCV 87 11/02/2014 0609   MCH 28.7 07/20/2023 0408   MCHC 33.0 07/20/2023 0408   RDW 15.4 07/20/2023 0408   RDW 13.6 11/02/2014 0609   LYMPHSABS 1.3 07/10/2023 0506   LYMPHSABS 1.2 11/02/2014 0609   MONOABS 0.8 07/10/2023 0506   MONOABS 1.1 (H) 11/02/2014 0609   EOSABS 0.2 07/10/2023 0506   EOSABS 0.3 11/02/2014 0609   BASOSABS 0.1 07/10/2023 0506   BASOSABS 0.0  11/02/2014 0609    BMET    Component Value Date/Time   NA 137 07/20/2023 0408   NA 136 08/02/2021 1629   NA 135 11/02/2014 0609   K 4.0 07/20/2023 0408   K 4.2 11/02/2014 0609   CL 103 07/20/2023 0408   CL 107 11/02/2014 0609   CO2 25 07/20/2023 0408   CO2 23 11/02/2014 0609   GLUCOSE 183 (H) 07/20/2023 0408   GLUCOSE 108 (H) 11/02/2014 0609   BUN 22 07/20/2023 0408   BUN 25 08/02/2021 1629   BUN 20 11/02/2014 0609   CREATININE 0.87 07/20/2023 0408   CREATININE 1.01 11/09/2015 1549   CREATININE 1.01 11/09/2015 1549   CALCIUM 8.2 (L) 07/20/2023 0408   CALCIUM 7.3 (L) 11/02/2014 0609   GFRNONAA >60 07/20/2023 0408   GFRNONAA 50 (L) 11/02/2014 0609   GFRAA >60 01/19/2019 0357   GFRAA 58 (L) 11/02/2014 0609    INR    Component Value Date/Time   INR 1.1 06/21/2023 2304   INR 0.9 10/17/2014 1131     Intake/Output Summary (Last 24 hours) at 07/22/2023 1219 Last data filed at 07/22/2023 2725 Gross per 24 hour  Intake 663.53 ml  Output --  Net 663.53 ml     Assessment/Plan:  63 y.o. female is s/p wound VAC washout with wound VAC change.  2 Days Post-Op   PLAN Vascular surgery plans on taking the patient  back to the operating room on 07/23/23 for another wound VAC change out and assessment of the sartorius flap in her left groin.  Patient was reminded she would be made n.p.o. after midnight on 07/23/23.  She verbalized her understanding.  Wishes to proceed.    Dr. Vilinda Flake MD is made aware of the plan and he agrees with plan.   DVT prophylaxis: Eliquis 2.5 twice daily, aspirin 81 mg daily, and Lipitor 20 daily   Marcie Bal [facility] 07/22/2023 12:19 PM

## 2023-07-22 NOTE — Progress Notes (Signed)
 Pt's bs noted to be 64. Writer rechecked bs. Reading now 63. Pt refused juice and graham crackers. " I have 2 chocolate chip cookies here that I will eat". Notified pt that we will recheck fsbs. Pt agrees with plan. No S&S of hypoglycemia. Pt A&O x 4. Obeys commands. No other complaints at this time. Will continue to monitor for changes.

## 2023-07-22 NOTE — Progress Notes (Signed)
 PROGRESS NOTE    [name]  Reed:[id number] DOB: 06-15-1961 DOA: 06/21/2023 PCP: Thersia Flax, MD  145A/145A-AA  LOS: 31 days   Brief hospital course:   Assessment & Plan: 63 y.o female with significant PMH of HTN, HLD, DM, sCHF, CAD, PAD (s/p of left common femoral-distal bypass, thrombectomy, angioplasty and stent placement 04/2023 on aspirin , Plavix  and Eliquis ), anxiety, bladder cancer (s/p of nephrectomy). [facility] on 06/21/23 with complaints of right groin pain, redness and oozing post op. Patient has hx of PAD and recently underwent left common femoral-distal bypass, thrombectomy, angioplasty and stent placement 04/2023 and right transmetatarsal amputation. SBP in the 50s and was sent to the ED for further evaluation.  Patient was initially admitted to hospitalist service later transferred to ICU for pressor support.  Patient has undergone multiple trips to the OR for wound debridement and wound VAC exchange.   1/3: Hemodynamically stable.  Going back to the OR on Monday.  Patient will need need wound VAC for many weeks. TOC having issues finding and home health agency.   1/4: Had an episode of hypoglycemia earlier this morning.  Requiring intervention.  Semglee  dose was decreased to 10 from 15 at night, also making sensitive SSI.   1/5: CBG elevated this morning so increasing the dose of Semglee  to 12 units.  Going for another wound VAC change with vascular surgery tomorrow, n.p.o. after midnight.   1/6: Patient had another wound VAC exchange and further debridement in OR with vascular surgery today.   1/7: Remained hemodynamically stable, going back to the OR on 07/16/2023   1/8: Hemodynamically stable, going back to the OR tomorrow   1/9: Remained stable, going back to the OR with vascular today   1/10: Hemodynamically stable.  Going back to the OR on Monday.   1/12: Patient had another wound VAC exchange in OR today.  Wound seems healing well.  Will need another  wound exchange within a week.  Patient will continue with IV antibiotics until 1/15, that will complete 4 weeks followed by Augmentin  per ID.   1/14: Slowly making progress, going back to or on Thursday.   Left groin abscess Sepsis with septic shock resolved Status post I&D with vascular surgery 12/18 with takeback to the OR for left groin debridement, muscle flap, Patient underwent wound VAC exchange on 07/06/2023 followed by 07/12/2024, 1/9 , 1/13 --ID consulted.  Unasyn  until 07/22/23 --cont po augmentin  for 2 weeks until 08/06/23 --OR for another wound VAC change on 1/16   PAD s/p of left common femoral-tibioperoneal trunk bypass, thrombectomy, angioplasty and stent placement 04/2023   --cont ASA and Eliquis  --cont statin   Leukocytosis-resolved --cont abx as above   Normocytic anemia Expected ABLA d/t surgeries normal hemoglobin prior to vascular procedures.   some blood tinge output in her wound VAC S/p 1 unit of packed red blood cells by vascular team.  --cont iron supplement --monitor and transfuse to keep Hgb >7   AKI - resolved Likely d/t septic shock resolved   Hypokalemia --monitor and supplement PRN   Hyponatremia  Resolved   Chronic diastolic HF Edema bilateral LE Monitor fluid status  Diuresis prn  Monitor BMP w/ diuresis   Hypoalbuminemia  Encourage po intake    HTN  Hold home antihypertensives.   HLD Continue statin   T2DM  A1c 8 --cont glargine 20u nightly --mealtime 4u TID --ACHS and SSI  Stool incontinence and diarrhea --Imodium  x1   DVT prophylaxis: On:Eliquis  Code Status:  Full code  Family Communication:  Level of care: Med-Surg Dispo:   The patient is from: home Anticipated d/c is to: home Anticipated d/c date is: 2-3 days   Subjective and Interval History:  Pt complained of stool incontinence while sleeping, diarrhea 1-3 episodes per day.     Objective: Vitals:   07/21/23 2336 07/22/23 0500 07/22/23 0754 07/22/23 1544   BP: (!) 137/56  (!) 138/49 (!) 151/63  Pulse: 61  (!) 59 68  Resp: 16  16 16   Temp: 98.5 F (36.9 C)  97.9 F (36.6 C) 97.8 F (36.6 C)  TempSrc:      SpO2: 95%  98% 95%  Weight:  76.6 kg    Height:        Intake/Output Summary (Last 24 hours) at 07/22/2023 1936 Last data filed at 07/22/2023 1549 Gross per 24 hour  Intake 600.13 ml  Output --  Net 600.13 ml   Filed Weights   07/19/23 0500 07/20/23 0658 07/22/23 0500  Weight: 74.1 kg 74.1 kg 76.6 kg    Examination:   Constitutional: NAD, AAOx3 HEENT: conjunctivae and lids normal, EOMI CV: No cyanosis.   RESP: normal respiratory effort, on RA Neuro: II - XII grossly intact.   Psych: Normal mood and affect.  Appropriate judgement and reason   Data Reviewed: I have personally reviewed labs and imaging studies  Time spent: 50 minutes  Garrison Kanner, MD [facility] If 7PM-7AM, please contact night-coverage 07/22/2023, 7:36 PM

## 2023-07-22 NOTE — Plan of Care (Signed)

## 2023-07-23 ENCOUNTER — Other Ambulatory Visit: Payer: Self-pay

## 2023-07-23 ENCOUNTER — Encounter
Admission: EM | Disposition: A | Discharge status: Home Health | Payer: Self-pay | Source: Home / Self Care | Attending: Internal Medicine

## 2023-07-23 ENCOUNTER — Inpatient Hospital Stay: Payer: 59 | Admitting: Anesthesiology

## 2023-07-23 DIAGNOSIS — M79605 Pain in left leg: Secondary | ICD-10-CM | POA: Diagnosis not present

## 2023-07-23 HISTORY — PX: APPLICATION OF WOUND VAC: SHX5189

## 2023-07-23 LAB — CBC
HCT: 29.5 % — ABNORMAL LOW (ref 36.0–46.0)
Hemoglobin: 9.5 g/dL — ABNORMAL LOW (ref 12.0–15.0)
MCH: 28.7 pg (ref 26.0–34.0)
MCHC: 32.2 g/dL (ref 30.0–36.0)
MCV: 89.1 fL (ref 80.0–100.0)
Platelets: 396 10*3/uL (ref 150–400)
RBC: 3.31 MIL/uL — ABNORMAL LOW (ref 3.87–5.11)
RDW: 15.6 % — ABNORMAL HIGH (ref 11.5–15.5)
WBC: 11.3 10*3/uL — ABNORMAL HIGH (ref 4.0–10.5)
nRBC: 0 % (ref 0.0–0.2)

## 2023-07-23 LAB — GLUCOSE, CAPILLARY
Glucose-Capillary: 80 mg/dL (ref 70–99)
Glucose-Capillary: 207 mg/dL — ABNORMAL HIGH (ref 70–99)
Glucose-Capillary: 65 mg/dL — ABNORMAL LOW (ref 70–99)
Glucose-Capillary: 196 mg/dL — ABNORMAL HIGH (ref 70–99)
Glucose-Capillary: 194 mg/dL — ABNORMAL HIGH (ref 70–99)
Glucose-Capillary: 126 mg/dL — ABNORMAL HIGH (ref 70–99)
Glucose-Capillary: 289 mg/dL — ABNORMAL HIGH (ref 70–99)

## 2023-07-23 SURGERY — APPLICATION, WOUND VAC
Anesthesia: General | Site: Groin | Laterality: Left

## 2023-07-23 MED ORDER — MIDAZOLAM HCL 2 MG/2ML IJ SOLN
INTRAMUSCULAR | Status: AC
Start: 2023-07-23 — End: ?
  Filled 2023-07-23: qty 2

## 2023-07-23 MED ORDER — PROPOFOL 500 MG/50ML IV EMUL
INTRAVENOUS | Status: DC | PRN
  Administered 2023-07-23: 100 ug/kg/min via INTRAVENOUS

## 2023-07-23 MED ORDER — LACTATED RINGERS IV SOLN: INTRAVENOUS | Status: DC

## 2023-07-23 MED ORDER — DEXTROSE 50 % IV SOLN
1.0000 | Freq: Once | INTRAVENOUS | Status: AC
  Administered 2023-07-23: 50 mL via INTRAVENOUS

## 2023-07-23 MED ORDER — ONDANSETRON HCL 4 MG/2ML IJ SOLN
INTRAMUSCULAR | Status: AC
  Filled 2023-07-23: qty 2

## 2023-07-23 MED ORDER — PROPOFOL 10 MG/ML IV BOLUS
INTRAVENOUS | Status: DC | PRN
  Administered 2023-07-23: 50 mg via INTRAVENOUS

## 2023-07-23 MED ORDER — ACETAMINOPHEN 10 MG/ML IV SOLN: 1000.0000 mg | Freq: Once | INTRAVENOUS | Status: DC | PRN

## 2023-07-23 MED ORDER — OXYCODONE HCL 5 MG/5ML PO SOLN: 5.0000 mg | Freq: Once | ORAL | Status: DC | PRN

## 2023-07-23 MED ORDER — OXYCODONE HCL 5 MG PO TABS: 5.0000 mg | ORAL_TABLET | Freq: Once | ORAL | Status: DC | PRN

## 2023-07-23 MED ORDER — FENTANYL CITRATE (PF) 100 MCG/2ML IJ SOLN: 25.0000 ug | INTRAMUSCULAR | Status: DC | PRN

## 2023-07-23 MED ORDER — LIDOCAINE HCL (PF) 2 % IJ SOLN
INTRAMUSCULAR | Status: DC | PRN
  Administered 2023-07-23: 50 mg via INTRADERMAL

## 2023-07-23 MED ORDER — MIDAZOLAM HCL 2 MG/2ML IJ SOLN
INTRAMUSCULAR | Status: DC | PRN
  Administered 2023-07-23: 2 mg via INTRAVENOUS

## 2023-07-23 MED ORDER — DEXAMETHASONE SODIUM PHOSPHATE 10 MG/ML IJ SOLN
INTRAMUSCULAR | Status: AC
  Filled 2023-07-23: qty 1

## 2023-07-23 MED ORDER — PROPOFOL 10 MG/ML IV BOLUS
INTRAVENOUS | Status: AC
  Filled 2023-07-23: qty 20

## 2023-07-23 MED ORDER — RISAQUAD PO CAPS
2.0000 | ORAL_CAPSULE | Freq: Two times a day (BID) | ORAL | Status: DC
  Administered 2023-07-23 – 2023-07-24 (×3): 2 via ORAL
  Filled 2023-07-23 (×3): qty 2

## 2023-07-23 MED ORDER — FENTANYL CITRATE (PF) 100 MCG/2ML IJ SOLN
INTRAMUSCULAR | Status: AC
  Filled 2023-07-23: qty 2

## 2023-07-23 MED ORDER — 0.9 % SODIUM CHLORIDE (POUR BTL) OPTIME
TOPICAL | Status: DC | PRN
  Administered 2023-07-23: 500 mL

## 2023-07-23 MED ORDER — DEXTROSE 50 % IV SOLN
INTRAVENOUS | Status: AC
  Filled 2023-07-23: qty 50

## 2023-07-23 MED ORDER — CALCIUM POLYCARBOPHIL 625 MG PO TABS
625.0000 mg | ORAL_TABLET | Freq: Two times a day (BID) | ORAL | Status: DC
  Administered 2023-07-23 – 2023-07-24 (×3): 625 mg via ORAL
  Filled 2023-07-23 (×4): qty 1

## 2023-07-23 MED ORDER — INSULIN GLARGINE-YFGN 100 UNIT/ML ~~LOC~~ SOLN
15.0000 [IU] | Freq: Every day | SUBCUTANEOUS | Status: DC
Start: 2023-07-23 — End: 2023-07-25
  Administered 2023-07-23: 15 [IU] via SUBCUTANEOUS
  Filled 2023-07-23 (×2): qty 0.15

## 2023-07-23 SURGICAL SUPPLY — 31 items
BNDG COHESIVE 6X5 TAN ST LF (GAUZE/BANDAGES/DRESSINGS) ×1 IMPLANT
CANISTER PREVENA 45 (CANNISTER) IMPLANT
CANISTER WOUND CARE 500ML ATS (WOUND CARE) IMPLANT
CHLORAPREP W/TINT 26 (MISCELLANEOUS) ×1 IMPLANT
DRAPE EXTREMITY 106X87X128.5 (DRAPES) IMPLANT
DRAPE INCISE IOBAN 66X45 STRL (DRAPES) IMPLANT
DRSG EMULSION OIL 3X3 NADH (GAUZE/BANDAGES/DRESSINGS) IMPLANT
DRSG EMULSION OIL 3X8 NADH (GAUZE/BANDAGES/DRESSINGS) IMPLANT
DRSG VAC GRANUFOAM LG (GAUZE/BANDAGES/DRESSINGS) IMPLANT
DRSG VAC GRANUFOAM MED (GAUZE/BANDAGES/DRESSINGS) IMPLANT
ELECT REM PT RETURN 9FT ADLT (ELECTROSURGICAL) ×1
ELECTRODE REM PT RTRN 9FT ADLT (ELECTROSURGICAL) ×1 IMPLANT
GAUZE SPONGE 4X4 12PLY STRL (GAUZE/BANDAGES/DRESSINGS) IMPLANT
GAUZE STRETCH 2X75IN STRL (MISCELLANEOUS) IMPLANT
GLOVE BIO SURGEON STRL SZ7 (GLOVE) ×1 IMPLANT
GLOVE SURG SYN 8.0 (GLOVE) ×1 IMPLANT
GLOVE SURG SYN 8.0 PF PI (GLOVE) ×1 IMPLANT
GOWN STRL REUS W/ TWL LRG LVL3 (GOWN DISPOSABLE) ×2 IMPLANT
GOWN STRL REUS W/ TWL XL LVL3 (GOWN DISPOSABLE) ×1 IMPLANT
GRAFT SKIN WND MICRO 38 (Tissue) IMPLANT
KIT TURNOVER KIT A (KITS) ×1 IMPLANT
LABEL OR SOLS (LABEL) ×1 IMPLANT
MANIFOLD NEPTUNE II (INSTRUMENTS) ×1 IMPLANT
NS IRRIG 500ML POUR BTL (IV SOLUTION) ×1 IMPLANT
PACK EXTREMITY ARMC (MISCELLANEOUS) ×1 IMPLANT
PAD PREP OB/GYN DISP 24X41 (PERSONAL CARE ITEMS) ×1 IMPLANT
SOL PREP PVP 2OZ (MISCELLANEOUS) ×1
SOLUTION PREP PVP 2OZ (MISCELLANEOUS) ×1 IMPLANT
STOCKINETTE IMPERV 14X48 (MISCELLANEOUS) ×1 IMPLANT
TRAP FLUID SMOKE EVACUATOR (MISCELLANEOUS) ×1 IMPLANT
WATER STERILE IRR 500ML POUR (IV SOLUTION) ×1 IMPLANT

## 2023-07-23 NOTE — Interval H&P Note (Signed)
History and Physical Interval Note:  07/23/2023 7:03 AM  Laura Reed  has presented today for surgery, with the diagnosis of LEFT GROIN INFECTION.  The various methods of treatment have been discussed with the patient and family. After consideration of risks, benefits and other options for treatment, the patient has consented to  Procedure(s): WOUND VAC EXCHANGE (Left) as a surgical intervention.  The patient's history has been reviewed, patient examined, no change in status, stable for surgery.  I have reviewed the patient's chart and labs.  Questions were answered to the patient's satisfaction.     [name]

## 2023-07-23 NOTE — Plan of Care (Signed)

## 2023-07-23 NOTE — Progress Notes (Signed)
Blood glucose 207 after 1 amp D50

## 2023-07-23 NOTE — Transfer of Care (Signed)
Immediate Anesthesia Transfer of Care Note  Patient: Laura Reed  Procedure(s) Performed: WOUND VAC EXCHANGE (Left: Groin)  Patient Location: PACU  Anesthesia Type:General  Level of Consciousness: awake, alert , and oriented  Airway & Oxygen Therapy: Patient Spontanous Breathing  Post-op Assessment: Report given to RN and Post -op Vital signs reviewed and stable  Post vital signs: stable  Last Vitals:  Vitals Value Taken Time  BP 136/52 07/23/23 0830  Temp    Pulse 59 07/23/23 0832  Resp 13 07/23/23 0832  SpO2 93 % 07/23/23 0832  Vitals shown include unfiled device data.  Last Pain:  Vitals:   07/23/23 0710  TempSrc: Temporal  PainSc: 0-No pain      Patients Stated Pain Goal: 3 (07/22/23 2125)  Complications: No notable events documented.

## 2023-07-23 NOTE — Anesthesia Postprocedure Evaluation (Signed)
Anesthesia Post Note  Patient: Laura Reed  Procedure(s) Performed: WOUND VAC EXCHANGE (Left: Groin)  Patient location during evaluation: PACU Anesthesia Type: General Level of consciousness: awake and alert, oriented and patient cooperative Pain management: pain level controlled Vital Signs Assessment: post-procedure vital signs reviewed and stable Respiratory status: spontaneous breathing, nonlabored ventilation and respiratory function stable Cardiovascular status: blood pressure returned to baseline and stable Postop Assessment: adequate PO intake Anesthetic complications: no   No notable events documented.   Last Vitals:  Vitals:   07/23/23 0917 07/23/23 0952  BP:  (!) 149/53  Pulse:  68  Resp: 17 16  Temp: 36.6 C 36.5 C  SpO2:  99%    Last Pain:  Vitals:   07/23/23 0952  TempSrc: Oral  PainSc:                  Reed Breech

## 2023-07-23 NOTE — Progress Notes (Signed)
Patient is awake and talking in PACU upon arrival from OR. Capillary glucose level 65 at 0835. Orange juice given to patient and she is currently drinking it. Will recheck blood sugar. VSS. A&O x4.

## 2023-07-23 NOTE — Progress Notes (Signed)
Patient blood glucose rechecked again after 15 minutes and is 62. Called Dr. Ronni Rumble and 1 amp d50 ordered. Will recheck blood glucose. Patient is asymptomatic.

## 2023-07-23 NOTE — Progress Notes (Signed)
PROGRESS NOTE    Laura Reed  GNF:[id number] DOB: April 10, 1961 DOA: 06/21/2023 PCP: Sherlene Shams, MD  145A/145A-AA  LOS: 32 days   Brief hospital course:   Assessment & Plan: 63 y.o female with significant PMH of HTN, HLD, DM, sCHF, CAD, PAD (s/p of left common femoral-distal bypass, thrombectomy, angioplasty and stent placement 04/2023 on aspirin, Plavix and Eliquis), anxiety, bladder cancer (s/p of nephrectomy). [facility] on 06/21/23 with complaints of right groin pain, redness and oozing post op. Patient has hx of PAD and recently underwent left common femoral-distal bypass, thrombectomy, angioplasty and stent placement 04/2023 and right transmetatarsal amputation. SBP in the 50s and was sent to the ED for further evaluation.  Patient was initially admitted to hospitalist service later transferred to ICU for pressor support.  Patient has undergone multiple trips to the OR for wound debridement and wound VAC exchange.   1/3: Hemodynamically stable.  Going back to the OR on Monday.  Patient will need need wound VAC for many weeks. TOC having issues finding and home health agency.   1/4: Had an episode of hypoglycemia earlier this morning.  Requiring intervention.  Semglee dose was decreased to 10 from 15 at night, also making sensitive SSI.   1/5: CBG elevated this morning so increasing the dose of Semglee to 12 units.  Going for another wound VAC change with vascular surgery tomorrow, n.p.o. after midnight.   1/6: Patient had another wound VAC exchange and further debridement in OR with vascular surgery today.   1/7: Remained hemodynamically stable, going back to the OR on 07/16/2023   1/8: Hemodynamically stable, going back to the OR tomorrow   1/9: Remained stable, going back to the OR with vascular today   1/10: Hemodynamically stable.  Going back to the OR on Monday.   1/12: Patient had another wound VAC exchange in OR today.  Wound seems healing well.  Will need another  wound exchange within a week.  Patient will continue with IV antibiotics until 1/15, that will complete 4 weeks followed by Augmentin per ID.   1/14: Slowly making progress, going back to or on Thursday.   Left groin abscess Sepsis with septic shock resolved Status post I&D with vascular surgery 12/18 with takeback to the OR for left groin debridement, muscle flap, Patient underwent wound VAC exchange on 07/06/2023 followed by 07/12/2024, 1/9 , 1/13, 1/16 --ID consulted.  Unasyn until 07/22/23 --cont po augmentin for 2 weeks until 08/06/23 --TOC in search of [facility] for wound vac change   PAD s/p of left common femoral-tibioperoneal trunk bypass, thrombectomy, angioplasty and stent placement 04/2023   --cont ASA and Eliquis --cont statin   Leukocytosis-resolved   Normocytic anemia Expected ABLA d/t surgeries normal hemoglobin prior to vascular procedures.   some blood tinge output in her wound VAC S/p 1 unit of packed red blood cells by vascular team.  --cont iron supplement --monitor and transfuse to keep Hgb >7   AKI - resolved Likely d/t septic shock resolved   Hypokalemia --monitor and supplement PRN   Hyponatremia  Resolved   Chronic diastolic HF Edema bilateral LE Monitor fluid status  Diuresis prn  Monitor BMP w/ diuresis   Hypoalbuminemia  Encourage po intake    HTN  Hold home antihypertensives.   HLD Continue statin  T2DM  A1c 8 --reduce glargine to 15u nightly --mealtime 4u TID --ACHS and SSI  Stool incontinence and diarrhea --start Fibercon 625 mg BID --start probiotics   DVT prophylaxis:  OZ:HYQMVHQ Code Status: Full code  Family Communication:  Level of care: Med-Surg Dispo:   The patient is from: home Anticipated d/c is to: home Anticipated d/c date is: 2-3 days   Subjective and Interval History:  Pt continued to have incontinence of loose stools.    Went for OR wound vac exchange, tolerated it well.   Objective: Vitals:   07/23/23  0915 07/23/23 0917 07/23/23 0952 07/23/23 1541  BP: (!) 159/61  (!) 149/53 (!) 153/53  Pulse: 70  68 69  Resp: 18 17 16 16   Temp:  97.9 F (36.6 C) 97.7 F (36.5 C) 98 F (36.7 C)  TempSrc:   Oral   SpO2: 98%  99% 98%  Weight:      Height:        Intake/Output Summary (Last 24 hours) at 07/23/2023 2001 Last data filed at 07/23/2023 1537 Gross per 24 hour  Intake 2021.22 ml  Output 1 ml  Net 2020.22 ml   Filed Weights   07/20/23 0658 07/22/23 0500 07/23/23 0710  Weight: 74.1 kg 76.6 kg 76.6 kg    Examination:   Constitutional: NAD, AAOx3 HEENT: conjunctivae and lids normal, EOMI CV: No cyanosis.   RESP: normal respiratory effort, on RA Neuro: II - XII grossly intact.   Psych: Normal mood and affect.  Appropriate judgement and reason Wound vac dressing over left groin with good seal   Data Reviewed: I have personally reviewed labs and imaging studies  Time spent: 35 minutes  Darlin Priestly, MD [facility] If 7PM-7AM, please contact night-coverage 07/23/2023, 8:01 PM

## 2023-07-23 NOTE — Op Note (Signed)
    OPERATIVE NOTE   PROCEDURE: Application of a wound VAC dressing left groin Application of Kerecis graft guide micro to the wound.   PRE-OPERATIVE DIAGNOSIS:  Left groin wound status post drainage of abscess   POST-OPERATIVE DIAGNOSIS: Same  SURGEON: [name]  ASSISTANT(S): Rolla Plate, NP  ANESTHESIA: MAC  ESTIMATED BLOOD LOSS: <5 cc  FINDING(S): Well granulated wound.  Remained in the medial aspect it appears to be slowly granulating from the superior to inferior aspect.  SPECIMEN(S): None  INDICATIONS:   Laura Reed is a 63 y.o. female who presents with left groin wound.  DESCRIPTION: After full informed written consent was obtained from the patient, the patient was brought back to the operating room and placed supine upon the operating table.  Prior to induction, the patient received IV antibiotics.   After obtaining adequate anesthesia, the patient was then prepped and draped in the standard fashion for a possible debridement and a VAC.  The existing VAC dressing is removed.  The Adaptic is removed.  Inspection demonstrates the graft remains visible in the medial aspect but at this point it does appear to be a shorter segment suggesting that we are getting coverage slowly.   The wound was then measured and is 15 cm in length by 4 cm in width by 3 cm in depth.   Kerecis graft guide micro is then reconstituted on the field and then applied to the area over the graft.   The Adaptic is then placed followed by a black VAC sponge and Ioban is utilized for a seal.  VAC is in the green before leaving the OR.     The patient tolerated this procedure well.   COMPLICATIONS: None  CONDITION: [name]  Office: [phone]   07/23/2023, 8:29 AM

## 2023-07-23 NOTE — Anesthesia Preprocedure Evaluation (Addendum)
Anesthesia Evaluation  Patient identified by MRN, date of birth, ID band Patient awake    Reviewed: Allergy & Precautions, NPO status , Patient's Chart, lab work & pertinent test results  History of Anesthesia Complications Negative for: history of anesthetic complications  Airway Mallampati: I   Neck ROM: Full    Dental  (+) Edentulous Upper, Edentulous Lower   Pulmonary former smoker (quit  2014)   Pulmonary exam normal breath sounds clear to auscultation       Cardiovascular hypertension, + CAD (s/p CABG), + Peripheral Vascular Disease (s/p stents on Plavix and Eliquis) and +CHF  Normal cardiovascular exam+ dysrhythmias  Rhythm:Regular Rate:Normal  ECG 06/23/23:  Atrial fibrillation Incomplete RBBB and LAFB Anterior infarct, old Repol abnrm suggests ischemia, lateral leads Prolonged QT interval 12 Lead; Mason-Likar  Myocardial perfusion 12/19/21:  Normal Lexiscan infusion EKG  Abnormal myocardial perfusion study with moderate reversibility of the  anterior apical myocardium consistent with myocardial ischemia   Echo 12/19/21:  NORMAL LEFT VENTRICULAR SYSTOLIC FUNCTION  NORMAL RIGHT VENTRICULAR SYSTOLIC FUNCTION  MILD VALVULAR REGURGITATION  NO VALVULAR STENOSIS    Neuro/Psych  PSYCHIATRIC DISORDERS Anxiety      Neuromuscular disease (neuropathy)    GI/Hepatic ,GERD  ,,  Endo/Other  diabetes, Type 2, Insulin Dependent    Renal/GU    Bladder CA    Musculoskeletal  (+) Arthritis ,    Abdominal   Peds  Hematology negative hematology ROS (+)   Anesthesia Other Findings Cardiology note 06/18/23:  1. Dyspnea, history of abnormal stress test, CAD.  Obtain echo, get a scan Myoview to evaluate ischemia. 2. CAD/CABG.  Denies chest pain.  Continue aspirin, Plavix, Lipitor 20 mg daily.  LDL at goal.  Echo and Myoview as above. 3. Hypertension, BP low.  Stop Norvasc, continue losartan 50 mg daily.  If BP stays low,  reduce losartan at follow-up visit. 4. Irregular heartbeat, EKG 2 months ago showed occasional PVCs.  Place cardiac monitor to evaluate any A-fib or flutter.  Patient on Eliquis due to recent vascular surgery procedure/thrombectomy. 5. PAD, managed per vascular surgery.  On aspirin, Plavix, Eliquis, Lipitor.   Follow-up after cardiac testing   Reproductive/Obstetrics                             Anesthesia Physical Anesthesia Plan  ASA: 3  Anesthesia Plan: General   Post-op Pain Management:    Induction: Intravenous  PONV Risk Score and Plan: 3 and Propofol infusion, TIVA, Treatment may vary due to age or medical condition and Ondansetron  Airway Management Planned: Natural Airway  Additional Equipment:   Intra-op Plan:   Post-operative Plan:   Informed Consent: I have reviewed the patients History and Physical, chart, labs and discussed the procedure including the risks, benefits and alternatives for the proposed anesthesia with the patient or authorized representative who has indicated his/her understanding and acceptance.       Plan Discussed with: CRNA  Anesthesia Plan Comments: (LMA/GETA backup discussed.  Patient consented for risks of anesthesia including but not limited to:  - adverse reactions to medications - damage to eyes, teeth, lips or other oral mucosa - nerve damage due to positioning  - sore throat or hoarseness - damage to heart, brain, nerves, lungs, other parts of body or loss of life  Informed patient about role of CRNA in peri- and intra-operative care.  Patient voiced understanding.)        Anesthesia Quick  Evaluation

## 2023-07-23 NOTE — OR Nursing (Signed)
Patient incontinent of dark tarry stool, peri care given and linen change.

## 2023-07-24 ENCOUNTER — Encounter: Payer: Self-pay | Admitting: Vascular Surgery

## 2023-07-24 ENCOUNTER — Other Ambulatory Visit: Payer: Self-pay | Admitting: *Deleted

## 2023-07-24 DIAGNOSIS — L02214 Cutaneous abscess of groin: Secondary | ICD-10-CM

## 2023-07-24 DIAGNOSIS — M79605 Pain in left leg: Secondary | ICD-10-CM | POA: Diagnosis not present

## 2023-07-24 LAB — CBC
HCT: 30.2 % — ABNORMAL LOW (ref 36.0–46.0)
Hemoglobin: 9.8 g/dL — ABNORMAL LOW (ref 12.0–15.0)
MCH: 28.6 pg (ref 26.0–34.0)
MCHC: 32.5 g/dL (ref 30.0–36.0)
MCV: 88 fL (ref 80.0–100.0)
Platelets: 380 10*3/uL (ref 150–400)
RBC: 3.43 MIL/uL — ABNORMAL LOW (ref 3.87–5.11)
RDW: 15.6 % — ABNORMAL HIGH (ref 11.5–15.5)
WBC: 10.3 10*3/uL (ref 4.0–10.5)
nRBC: 0 % (ref 0.0–0.2)

## 2023-07-24 LAB — BASIC METABOLIC PANEL
Anion gap: 9 (ref 5–15)
BUN: 25 mg/dL — ABNORMAL HIGH (ref 8–23)
CO2: 24 mmol/L (ref 22–32)
Calcium: 8.1 mg/dL — ABNORMAL LOW (ref 8.9–10.3)
Chloride: 103 mmol/L (ref 98–111)
Creatinine, Ser: 0.79 mg/dL (ref 0.44–1.00)
GFR, Estimated: >60 mL/min (ref >60)
Glucose, Bld: 293 mg/dL — ABNORMAL HIGH (ref 70–99)
Potassium: 3.4 mmol/L — ABNORMAL LOW (ref 3.5–5.1)
Sodium: 136 mmol/L (ref 135–145)

## 2023-07-24 LAB — MAGNESIUM: Magnesium: 2 mg/dL (ref 1.7–2.4)

## 2023-07-24 LAB — GLUCOSE, CAPILLARY
Glucose-Capillary: 198 mg/dL — ABNORMAL HIGH (ref 70–99)
Glucose-Capillary: 178 mg/dL — ABNORMAL HIGH (ref 70–99)
Glucose-Capillary: 137 mg/dL — ABNORMAL HIGH (ref 70–99)

## 2023-07-24 MED ORDER — INSULIN LISPRO (1 UNIT DIAL) 100 UNIT/ML (KWIKPEN): PEN_INJECTOR | SUBCUTANEOUS | 1 refills | Status: DC

## 2023-07-24 MED ORDER — FE FUM-VIT C-VIT B12-FA 460-60-0.01-1 MG PO CAPS: 1.0000 | ORAL_CAPSULE | Freq: Two times a day (BID) | ORAL | 2 refills | Status: DC

## 2023-07-24 MED ORDER — TRESIBA FLEXTOUCH 100 UNIT/ML ~~LOC~~ SOPN: 15.0000 [IU] | PEN_INJECTOR | Freq: Every day | SUBCUTANEOUS | Status: AC

## 2023-07-24 MED ORDER — POTASSIUM CHLORIDE CRYS ER 20 MEQ PO TBCR
40.0000 meq | EXTENDED_RELEASE_TABLET | Freq: Once | ORAL | Status: AC
  Administered 2023-07-24: 40 meq via ORAL
  Filled 2023-07-24: qty 2

## 2023-07-24 MED ORDER — OXYCODONE-ACETAMINOPHEN 5-325 MG PO TABS: 1.0000 | ORAL_TABLET | Freq: Every day | ORAL | 0 refills | Status: DC | PRN

## 2023-07-24 MED ORDER — RISAQUAD PO CAPS: 2.0000 | ORAL_CAPSULE | Freq: Two times a day (BID) | ORAL | Status: AC

## 2023-07-24 MED ORDER — AMOXICILLIN-POT CLAVULANATE 875-125 MG PO TABS: 1.0000 | ORAL_TABLET | Freq: Two times a day (BID) | ORAL | 0 refills | Status: DC

## 2023-07-24 MED ORDER — LOSARTAN POTASSIUM 50 MG PO TABS
50.0000 mg | ORAL_TABLET | Freq: Every day | ORAL | Status: DC
  Administered 2023-07-24: 50 mg via ORAL
  Filled 2023-07-24: qty 1

## 2023-07-24 MED ORDER — CALCIUM POLYCARBOPHIL 625 MG PO TABS: 625.0000 mg | ORAL_TABLET | Freq: Two times a day (BID) | ORAL | 0 refills | Status: AC

## 2023-07-24 NOTE — Plan of Care (Signed)
Patient alert and oriented. Continues to have loose stools. Medicated as needed. No acute distress noted. Will continue to monitor.   Problem: Education: Goal: Ability to describe self-care measures that may prevent or decrease complications (Diabetes Survival Skills Education) will improve Outcome: Progressing Goal: Individualized Educational Video(s) Outcome: Progressing   Problem: Coping: Goal: Ability to adjust to condition or change in health will improve Outcome: Progressing   Problem: Fluid Volume: Goal: Ability to maintain a balanced intake and output will improve Outcome: Progressing   Problem: Health Behavior/Discharge Planning: Goal: Ability to identify and utilize available resources and services will improve Outcome: Progressing Goal: Ability to manage health-related needs will improve Outcome: Progressing   Problem: Metabolic: Goal: Ability to maintain appropriate glucose levels will improve Outcome: Progressing   Problem: Nutritional: Goal: Maintenance of adequate nutrition will improve Outcome: Progressing Goal: Progress toward achieving an optimal weight will improve Outcome: Progressing   Problem: Skin Integrity: Goal: Risk for impaired skin integrity will decrease Outcome: Progressing   Problem: Tissue Perfusion: Goal: Adequacy of tissue perfusion will improve Outcome: Progressing   Problem: Education: Goal: Knowledge of General Education information will improve Description: Including pain rating scale, medication(s)/side effects and non-pharmacologic comfort measures Outcome: Progressing   Problem: Health Behavior/Discharge Planning: Goal: Ability to manage health-related needs will improve Outcome: Progressing   Problem: Clinical Measurements: Goal: Ability to maintain clinical measurements within normal limits will improve Outcome: Progressing Goal: Will remain free from infection Outcome: Progressing Goal: Diagnostic test results will  improve Outcome: Progressing Goal: Respiratory complications will improve Outcome: Progressing Goal: Cardiovascular complication will be avoided Outcome: Progressing   Problem: Activity: Goal: Risk for activity intolerance will decrease Outcome: Progressing   Problem: Nutrition: Goal: Adequate nutrition will be maintained Outcome: Progressing   Problem: Coping: Goal: Level of anxiety will decrease Outcome: Progressing   Problem: Elimination: Goal: Will not experience complications related to bowel motility Outcome: Progressing Goal: Will not experience complications related to urinary retention Outcome: Progressing   Problem: Pain Management: Goal: General experience of comfort will improve Outcome: Progressing   Problem: Safety: Goal: Ability to remain free from injury will improve Outcome: Progressing   Problem: Skin Integrity: Goal: Risk for impaired skin integrity will decrease Outcome: Progressing

## 2023-07-24 NOTE — Consult Note (Signed)
[facility] Liaison Consult Note   07/24/2023  LATICIA SMIEJA Mar 07, 1961 [id number]  UPDATE:  Liaison attempted another outreach call today as pt will be discharging home. TOC documentation indicates no [facility] has accepted however VAC systems will be in place.   Liaison will make a referral for [facility] for post hospital prevention readmission follow up call.   VBCI Care Management/Population Health does not replace or interfere with any arrangements made by the Inpatient Transition of Care team.   For questions contact:   Elliot Cousin, RN, [facility] Liaison [facility]   [facility], Population Health Office Hours MTWF  8:00 am-6:00 pm Direct Dial: [phone] mobile [phone] [Office toll free line] Office Hours are M-F 8:30 - 5 pm [email or web address]

## 2023-07-24 NOTE — Progress Notes (Signed)
PT Cancellation Note  Patient Details Name: Laura Reed MRN: [id number] DOB: 1961/01/14   Cancelled Treatment:    Reason Eval/Treat Not Completed: Other (comment): Chart Reviewed. Upon PT arrival, patient and family member present in room. Patient declined participation in therapy services this date/time due to expected discharge shortly. Case manager present.    Creed Copper Fairly, PT, DPT 07/24/23 11:35 AM

## 2023-07-24 NOTE — Discharge Instructions (Signed)
[facility] They will call you to set up when they are coming out to see you   [address], [place], [place] [address] Hours:  Open ? Closes 5?PM Phone: [phone]

## 2023-07-24 NOTE — Progress Notes (Signed)
Progress Note    07/24/2023 8:18 AM 1 Day Post-Op  Subjective:  Laura Reed is a 63 yo female now status post op day #1 from wound VAC washout and change in the operating room. Wound VAC working well with noted 40 cc of serous drainage to the canister. No complaints overnight. Patient continues to work with physical therapy. Vitals are remained stable.    Vitals:   07/23/23 2315 07/24/23 0817  BP: (!) 166/53 (!) 161/58  Pulse: 71 67  Resp: 16 17  Temp: 97.9 F (36.6 C) 98 F (36.7 C)  SpO2: 95% 97%   Physical Exam: Cardiac:  RRR, Normal S1,S2. No murmurs appreciated.  Bradycardia resolved. Lungs: Clear on auscultation throughout but remains diminished in the bases.  Nonlabored breathing.  Nonproductive cough this morning.  Instructed to use bedside incentive spirometry. Incisions: Left groin with wound VAC in place.  Working well. New Yeast infection to surrounding tissue of wound bed.  Extremities: Bilateral lower extremities warm to touch.  Left lower extremity with +2 edema.  Able to palpate pulses but has positive Doppler pulses. Abdomen: Positive bowel sounds throughout, soft, nontender nondistended. Neurologic: Alert and oriented x 3, answers all questions and follows commands appropriately  CBC    Component Value Date/Time   WBC 10.3 07/24/2023 0505   RBC 3.43 (L) 07/24/2023 0505   HGB 9.8 (L) 07/24/2023 0505   HGB 9.5 (L) 11/02/2014 0609   HCT 30.2 (L) 07/24/2023 0505   HCT 29.5 (L) 11/02/2014 0609   PLT 380 07/24/2023 0505   PLT 217 11/02/2014 0609   MCV 88.0 07/24/2023 0505   MCV 87 11/02/2014 0609   MCH 28.6 07/24/2023 0505   MCHC 32.5 07/24/2023 0505   RDW 15.6 (H) 07/24/2023 0505   RDW 13.6 11/02/2014 0609   LYMPHSABS 1.3 07/10/2023 0506   LYMPHSABS 1.2 11/02/2014 0609   MONOABS 0.8 07/10/2023 0506   MONOABS 1.1 (H) 11/02/2014 0609   EOSABS 0.2 07/10/2023 0506   EOSABS 0.3 11/02/2014 0609   BASOSABS 0.1 07/10/2023 0506   BASOSABS 0.0 11/02/2014  0609    BMET    Component Value Date/Time   NA 136 07/24/2023 0505   NA 136 08/02/2021 1629   NA 135 11/02/2014 0609   K 3.4 (L) 07/24/2023 0505   K 4.2 11/02/2014 0609   CL 103 07/24/2023 0505   CL 107 11/02/2014 0609   CO2 24 07/24/2023 0505   CO2 23 11/02/2014 0609   GLUCOSE 293 (H) 07/24/2023 0505   GLUCOSE 108 (H) 11/02/2014 0609   BUN 25 (H) 07/24/2023 0505   BUN 25 08/02/2021 1629   BUN 20 11/02/2014 0609   CREATININE 0.79 07/24/2023 0505   CREATININE 1.01 11/09/2015 1549   CREATININE 1.01 11/09/2015 1549   CALCIUM 8.1 (L) 07/24/2023 0505   CALCIUM 7.3 (L) 11/02/2014 0609   GFRNONAA >60 07/24/2023 0505   GFRNONAA 50 (L) 11/02/2014 0609   GFRAA >60 01/19/2019 0357   GFRAA 58 (L) 11/02/2014 0609    INR    Component Value Date/Time   INR 1.1 06/21/2023 2304   INR 0.9 10/17/2014 1131     Intake/Output Summary (Last 24 hours) at 07/24/2023 0818 Last data filed at 07/23/2023 1537 Gross per 24 hour  Intake 600 ml  Output --  Net 600 ml     Assessment/Plan:  63 y.o. female is s/p wound VAC washout with wound VAC change.  1 Day Post-Op   PLAN Vascular surgery okay with patient discharge  to home today.  Patient set up with home health for observation only and wound VAC supplies.  Patient will return to vascular surgery clinic on Monday, 07/27/2023 for wound VAC change and assessment.  Patient is aware of this plan and is willing to proceed.  Patient verbalizes she will be able to come to clinic twice a week for wound VAC changes and assessments.  Patient will need to remain on Eliquis 2.5 mg daily, aspirin 81 mg daily and her Lipitor 20 mg daily upon discharge.  DVT prophylaxis:  Eliquis 2.5 twice daily, aspirin 81 mg daily, and Lipitor 20 daily    Marcie [facility] 07/24/2023 8:18 AM

## 2023-07-24 NOTE — TOC Progression Note (Addendum)
Transition of Care [facility]) - Progression Note    Patient Details  Name: Laura Reed MRN: [id number] Date of Birth: [birth year]  [facility] CM/SW Contact  Marlowe Sax, RN Phone Number: 07/24/2023, 8:57 AM  Clinical Narrative:     I spoke with French Ana at [facility] to arrange for a wound vac, the patient will go to physicians office 2 times per week to change wound vac, I resent the referral out to [facility],. [facility], [facility], [facility] and well care to see if a [facility] agency will accept her, Frances Furbish is not able to [facility] will send the escript to physician to sign and arrange delivery so that the patient can go home today  Awaiting to hear back from other [facility] agencies to see who can accept her The patient reports that her sisiter is a terachable caregiver   French Ana with [facility] called and stated that they are in place and are going to be delivering the Wound vac and supplies ASAP today   Barriers to Discharge: No Home Care Agency will accept this patient  Expected Discharge Plan and Services                                               Social Determinants of Health (SDOH) Interventions SDOH Screenings   Food Insecurity: No Food Insecurity (06/23/2023)  Recent Concern: Food Insecurity - Food Insecurity Present (05/26/2023)   Received from [facility]  Housing: Low Risk  (06/23/2023)  Transportation Needs: No Transportation Needs (06/23/2023)  Utilities: Not At Risk (06/23/2023)  Alcohol Screen: Low Risk  (06/17/2023)  Depression (PHQ2-9): Low Risk  (06/17/2023)  Financial Resource Strain: Low Risk  (06/21/2023)   [facility] from [facility]  Physical Activity: Inactive (06/17/2023)  Social Connections: Socially Isolated (06/17/2023)  Stress: Stress Concern Present (06/17/2023)  Tobacco Use: Medium Risk (07/20/2023)  Health Literacy: Inadequate Health Literacy (06/17/2023)    Readmission Risk Interventions     06/22/2023   11:01 AM  Readmission Risk Prevention Plan  Transportation Screening Complete  PCP or Specialist Appt within 3-5 Days Complete  Social Work Consult for Recovery Care Planning/Counseling Complete  Palliative Care Screening Not Applicable  Medication Review Oceanographer) Complete

## 2023-07-24 NOTE — Discharge Summary (Signed)
Physician Discharge Summary   Laura Reed  female DOB: 1960/08/13  GNF:[id number]  PCP: Sherlene Shams, MD  Admit date: 06/21/2023 Discharge date: 07/24/2023  Admitted From: home Disposition:  home CODE STATUS: Full code  Discharge Instructions     Diet - low sodium heart healthy   Complete by: As directed    Discharge instructions   Complete by: As directed    You will go to Vascular clinic to have your wound vac dressing changed on Mondays and Thursdays.  Dressing supplies will be delivered to your house.  Continue to take antibiotic Augmentin with last day 08/06/23.  Please reduce your Evaristo Bury to 15 units daily.  Take fast-acting insulin 5-10 units with each meal.  5 units if blood sugar <200, 10 units if blood sugar >200.  You will continue aspirin 81 mg and Eliquis, and stop plavix. - -   Discharge wound care:   Complete by: As directed    Continue wound vac and dressing change at vascular clinic twice a week. [facility] Course:  For full details, please see H&P, progress notes, consult notes and ancillary notes.  Briefly,  Laura Reed is a 63 y.o female with significant PMH of HTN, HLD, DM, sCHF, CAD, PAD (s/p of left common femoral-distal bypass, thrombectomy, angioplasty and stent placement 04/2023 on aspirin, Plavix and Eliquis), anxiety, bladder cancer (s/p of nephrectomy) who presented to [facility] urgent care on 06/21/23 with complaints of right groin pain, redness and oozing post op.   SBP found to be in the 50s and was sent to the ED for further evaluation.  Patient was initially admitted to hospitalist service, later transferred to ICU for pressor support.  Patient has undergone multiple trips to the OR for wound debridement and wound VAC exchange.   Left groin abscess Sepsis with septic shock resolved Status post I&D with vascular surgery 12/18 with takeback to the OR for left groin debridement, muscle flap.  Patient underwent wound VAC exchanges  9 times, with last one on 07/23/23. --ID consulted.  Pt received IV abx for 4 weeks, mostly with Unasyn until 07/22/23, and then transitioned to augmentin for 2 weeks until 08/06/23.   --pt was discharged with wound vac.  TOC was not able to find a [facility] agency willing to perform home wound vac dressing change, pt therefore will go to vascular clinic Monday and Thursday to have her dressing changed.  Dressing supplies were delivered to pt before discharge.  Candidiasis of the skin around wound --completed 5 day of fluconazole       PAD s/p of left common femoral-tibioperoneal trunk bypass, thrombectomy, angioplasty and stent placement 04/2023   --cont home ASA and Eliquis.  Stop home plavix. --cont statin   Normocytic anemia Expected ABLA d/t surgeries normal hemoglobin prior to vascular procedures.   some blood tinge output in her wound VAC S/p 1 unit of packed red blood cells by vascular team.  --cont iron supplement   AKI - resolved Likely d/t septic shock   Hypokalemia --monitored and supplemented PRN   Hyponatremia  Resolved   Chronic diastolic HF Edema bilateral LE --diuresed PRN   Hypoalbuminemia  Encourage po intake    HTN  --home losartan held during hospitalization, resumed prior to discharge.   HLD Continue statin   T2DM  A1c 8. --pt had hypoglycemia due to too much long-acting insulin.  Home Evaristo Bury was reduced from 22u to 15u daily.  Pt was  discharged on Lispro 5-10 units with meals TID.   Stool incontinence and diarrhea --improved with Fibercon 625 mg BID and probiotics.  Continue both after discharge.   Discharge Diagnoses:  Principal Problem:   Left leg pain Active Problems:   PVD (peripheral vascular disease) (HCC)   AKI (acute kidney injury) (HCC)   Leukocytosis   Diarrhea   Hypertension   Hyperlipidemia associated with type 2 diabetes mellitus (HCC)   CAD (coronary artery disease)   Type II diabetes mellitus with peripheral circulatory disorder  (HCC)   Chronic diastolic CHF (congestive heart failure) (HCC)   Normocytic anemia   Hyponatremia   Hypophosphatemia   Abscess of groin, left   Vascular graft infection (HCC)   Candidiasis   30 Day Unplanned Readmission Risk Score    Flowsheet Row ED to Hosp-Admission (Current) from 06/21/2023 in [facility] (1A)  30 Day Unplanned Readmission Risk Score (%) 31.07 Filed at 07/24/2023 0801       This score is the patient's risk of an unplanned readmission within 30 days of being discharged (0 -100%). The score is based on dignosis, age, lab data, medications, orders, and past utilization.   Low:  0-14.9   Medium: 15-21.9   High: 22-29.9   Extreme: 30 and above         Discharge Instructions:  Allergies as of 07/24/2023   No Known Allergies      Medication List     STOP taking these medications    ALPRAZolam 0.5 MG tablet Commonly known as: XANAX   clopidogrel 75 MG tablet Commonly known as: PLAVIX       TAKE these medications    acetaminophen 500 MG tablet Commonly known as: TYLENOL Take 1,000 mg by mouth every 6 (six) hours as needed for mild pain or headache.   acidophilus Caps capsule Take 2 capsules by mouth 2 (two) times daily.   amoxicillin-clavulanate 875-125 MG tablet Commonly known as: AUGMENTIN Take 1 tablet by mouth every 12 (twelve) hours for 14 days.   apixaban 2.5 MG Tabs tablet Commonly known as: ELIQUIS Take 1 tablet (2.5 mg total) by mouth 2 (two) times daily.   aspirin 81 MG chewable tablet Chew 81 mg by mouth at bedtime.   atorvastatin 20 MG tablet Commonly known as: LIPITOR TAKE 1 TABLET(20 MG) BY MOUTH DAILY   empagliflozin 25 MG Tabs tablet Commonly known as: Jardiance TAKE 1 TABLET(25 MG) BY MOUTH DAILY BEFORE AND BREAKFAST   Fe Fum-Vit C-Vit B12-FA Caps capsule Commonly known as: TRIGELS-F FORTE Take 1 capsule by mouth 2 (two) times daily.   FreeStyle Libre 2 Sensor Misc Use to check  sugar at least 4 times daily   gabapentin 300 MG capsule Commonly known as: NEURONTIN Take 300 mg by mouth 2 (two) times daily.   glucose blood test strip Use you check blood sugars up to four times daily.   insulin lispro 100 UNIT/ML KwikPen Commonly known as: HumaLOG KwikPen ADMINISTER 5-10 UNITS UNDER THE SKIN THREE TIMES DAILY if you eat a meal.  5 units if blood sugar <200, 10 units if blood sugar >200. What changed: additional instructions   losartan 50 MG tablet Commonly known as: COZAAR Take 50 mg by mouth daily.   multivitamin tablet Take 1 tablet by mouth daily.   oxyCODONE-acetaminophen 5-325 MG tablet Commonly known as: PERCOCET/ROXICET Take 1 tablet by mouth daily as needed (with wound vac exchanges).   Pen Needles 32G X 4 MM Misc Use  to take insulin daily   polycarbophil 625 MG tablet Commonly known as: FIBERCON Take 1 tablet (625 mg total) by mouth 2 (two) times daily. For stool bulking.   Evaristo Bury FlexTouch 100 UNIT/ML FlexTouch Pen Generic drug: insulin degludec Inject 15 Units into the skin daily. Reduced from 22 units daily. What changed:  how much to take additional instructions   triamcinolone ointment 0.5 % Commonly known as: KENALOG Apply 1 Application topically 2 (two) times daily.   Vitamin D3 50 MCG (2000 UT) Tabs Take 2,000 Units by mouth daily after supper.               Durable Medical Equipment  (From admission, onward)           Start     Ordered   07/24/23 0959  For home use only DME Walker rolling  Once       Question Answer Comment  Walker: With 5 Inch Wheels   Patient needs a walker to treat with the following condition Weakness      07/24/23 0958              Discharge Care Instructions  (From admission, onward)           Start     Ordered   07/24/23 0000  Discharge wound care:       Comments: Continue wound vac and dressing change at vascular clinic twice a week. - -   07/24/23 1008              Follow-up Information     Sherlene Shams, MD Follow up in 1 week(s).   Specialty: Internal Medicine Contact information: [address] [place] [address] [phone]                 No Known Allergies   The results of significant diagnostics from this hospitalization (including imaging, microbiology, ancillary and laboratory) are listed below for reference.   Consultations:   Procedures/Studies: ECHOCARDIOGRAM COMPLETE Result Date: 06/25/2023    ECHOCARDIOGRAM REPORT   Patient Name:   Laura Reed Date of Exam: 06/24/2023 Medical Rec #:  [id number]       Height:       67.0 in Accession #:    [id number]      Weight:       141.4 lb Date of Birth:  08/29/1960       BSA:          1.745 m Patient Age:    62 years        BP:           107/74 mmHg Patient Gender: F               HR:           65 bpm. Exam Location:  [facility] Procedure: 2D Echo, Cardiac Doppler and Color Doppler Indications:     I48.91 Atrial Fibrillation.  History:         Patient has no prior history of Echocardiogram examinations.                  CHF, CAD, Signs/Symptoms:Murmur; Risk Factors:Hypertension and                  Diabetes.  Sonographer:     Daphine Deutscher RDCS Referring Phys:  [id number] Latina Craver [name] Diagnosing Phys: Julien Nordmann MD IMPRESSIONS  1. Left ventricular ejection fraction, by estimation, is 35 to 40%. The left  ventricle has moderately decreased function. The left ventricle demonstrates global hypokinesis. The left ventricular internal cavity size was mildly dilated. Left ventricular diastolic parameters are indeterminate.  2. Right ventricular systolic function is normal. The right ventricular size is normal. There is mildly elevated pulmonary artery systolic pressure. The estimated right ventricular systolic pressure is 42.5 mmHg.  3. The mitral valve is normal in structure. Mild to moderate mitral valve regurgitation. No evidence of mitral stenosis.  4.  Tricuspid valve regurgitation is mild to moderate.  5. The aortic valve is calcified. There is moderate calcification of the aortic valve. Aortic valve regurgitation is not visualized. Aortic valve sclerosis/calcification is present, without any evidence of aortic stenosis.  6. The inferior vena cava is dilated in size with >50% respiratory variability, suggesting right atrial pressure of 8 mmHg. FINDINGS  Left Ventricle: Left ventricular ejection fraction, by estimation, is 35 to 40%. The left ventricle has moderately decreased function. The left ventricle demonstrates global hypokinesis. The left ventricular internal cavity size was mildly dilated. There is no left ventricular hypertrophy. Left ventricular diastolic parameters are indeterminate. Right Ventricle: The right ventricular size is normal. No increase in right ventricular wall thickness. Right ventricular systolic function is normal. There is mildly elevated pulmonary artery systolic pressure. The tricuspid regurgitant velocity is 2.85  m/s, and with an assumed right atrial pressure of 10 mmHg, the estimated right ventricular systolic pressure is 42.5 mmHg. Left Atrium: Left atrial size was normal in size. Right Atrium: Right atrial size was normal in size. Pericardium: There is no evidence of pericardial effusion. Mitral Valve: The mitral valve is normal in structure. Mild to moderate mitral valve regurgitation. No evidence of mitral valve stenosis. Tricuspid Valve: The tricuspid valve is normal in structure. Tricuspid valve regurgitation is mild to moderate. No evidence of tricuspid stenosis. Aortic Valve: The aortic valve is calcified. There is moderate calcification of the aortic valve. Aortic valve regurgitation is not visualized. Aortic valve sclerosis/calcification is present, without any evidence of aortic stenosis. Pulmonic Valve: The pulmonic valve was normal in structure. Pulmonic valve regurgitation is not visualized. No evidence of pulmonic  stenosis. Aorta: The aortic root is normal in size and structure. Venous: The inferior vena cava is dilated in size with greater than 50% respiratory variability, suggesting right atrial pressure of 8 mmHg. IAS/Shunts: No atrial level shunt detected by color flow Doppler.  LEFT VENTRICLE PLAX 2D LVIDd:         5.90 cm   Diastology LVIDs:         4.60 cm   LV e' medial:    5.82 cm/s LV PW:         0.80 cm   LV E/e' medial:  25.1 LV IVS:        0.50 cm   LV e' lateral:   7.51 cm/s LVOT diam:     1.80 cm   LV E/e' lateral: 19.4 LV SV:         50 LV SV Index:   29 LVOT Area:     2.54 cm  RIGHT VENTRICLE            IVC RV Basal diam:  4.20 cm    IVC diam: 2.20 cm RV S prime:     8.98 cm/s TAPSE (M-mode): 2.4 cm LEFT ATRIUM             Index        RIGHT ATRIUM  Index LA diam:        4.10 cm 2.35 cm/m   RA Area:     13.20 cm LA Vol (A2C):   68.0 ml 38.96 ml/m  RA Volume:   29.20 ml  16.73 ml/m LA Vol (A4C):   47.6 ml 27.28 ml/m LA Biplane Vol: 58.8 ml 33.69 ml/m  AORTIC VALVE LVOT Vmax:   83.15 cm/s LVOT Vmean:  55.750 cm/s LVOT VTI:    0.196 m  AORTA Ao Root diam: 2.50 cm MITRAL VALVE                TRICUSPID VALVE MV Area (PHT): 3.68 cm     TR Peak grad:   32.5 mmHg MV Decel Time: 206 msec     TR Vmax:        285.00 cm/s MV E velocity: 146.00 cm/s MV A velocity: 79.00 cm/s   SHUNTS MV E/A ratio:  1.85         Systemic VTI:  0.20 m                             Systemic Diam: 1.80 cm Julien Nordmann MD Electronically signed by Julien Nordmann MD Signature Date/Time: 06/25/2023/5:07:32 PM    Final       Labs: BNP (last 3 results) Recent Labs    06/21/23 1351  BNP 557.6*   Basic Metabolic Panel: Recent Labs  Lab 07/20/23 0408 07/24/23 0505  NA 137 136  K 4.0 3.4*  CL 103 103  CO2 25 24  GLUCOSE 183* 293*  BUN 22 25*  CREATININE 0.87 0.79  CALCIUM 8.2* 8.1*  MG  --  2.0   Liver Function Tests: No results for input(s): "AST", "ALT", "ALKPHOS", "BILITOT", "PROT", "ALBUMIN" in the last  168 hours. No results for input(s): "LIPASE", "AMYLASE" in the last 168 hours. No results for input(s): "AMMONIA" in the last 168 hours. CBC: Recent Labs  Lab 07/20/23 0408 07/23/23 0513 07/24/23 0505  WBC 10.3 11.3* 10.3  HGB 9.4* 9.5* 9.8*  HCT 28.5* 29.5* 30.2*  MCV 87.2 89.1 88.0  PLT 456* 396 380   Cardiac Enzymes: No results for input(s): "CKTOTAL", "CKMB", "CKMBINDEX", "TROPONINI" in the last 168 hours. BNP: Invalid input(s): "POCBNP" CBG: Recent Labs  Lab 07/23/23 0950 07/23/23 1145 07/23/23 1620 07/23/23 2037 07/24/23 0818  GLUCAP 196* 194* 126* 289* 198*   D-Dimer No results for input(s): "DDIMER" in the last 72 hours. Hgb A1c No results for input(s): "HGBA1C" in the last 72 hours. Lipid Profile No results for input(s): "CHOL", "HDL", "LDLCALC", "TRIG", "CHOLHDL", "LDLDIRECT" in the last 72 hours. Thyroid function studies No results for input(s): "TSH", "T4TOTAL", "T3FREE", "THYROIDAB" in the last 72 hours.  Invalid input(s): "FREET3" Anemia work up No results for input(s): "VITAMINB12", "FOLATE", "FERRITIN", "TIBC", "IRON", "RETICCTPCT" in the last 72 hours. Urinalysis    Component Value Date/Time   COLORURINE YELLOW (A) 05/11/2023 1317   APPEARANCEUR TURBID (A) 05/11/2023 1317   APPEARANCEUR Hazy (A) 08/12/2022 1430   LABSPEC 1.019 05/11/2023 1317   LABSPEC 1.010 01/09/2014 1108   PHURINE 5.0 05/11/2023 1317   GLUCOSEU NEGATIVE 05/11/2023 1317   GLUCOSEU Negative 01/09/2014 1108   HGBUR LARGE (A) 05/11/2023 1317   BILIRUBINUR NEGATIVE 05/11/2023 1317   BILIRUBINUR Negative 08/12/2022 1430   BILIRUBINUR Negative 01/09/2014 1108   KETONESUR 5 (A) 05/11/2023 1317   PROTEINUR 100 (A) 05/11/2023 1317   UROBILINOGEN 0.2 04/09/2016 1159  NITRITE POSITIVE (A) 05/11/2023 1317   LEUKOCYTESUR MODERATE (A) 05/11/2023 1317   LEUKOCYTESUR 3+ 01/09/2014 1108   Sepsis Labs Recent Labs  Lab 07/20/23 0408 07/23/23 0513 07/24/23 0505  WBC 10.3 11.3* 10.3    Microbiology No results found for this or any previous visit (from the past 240 hours).   Total time spend on discharging this patient, including the last patient exam, discussing the hospital stay, instructions for ongoing care as it relates to all pertinent caregivers, as well as preparing the medical discharge records, prescriptions, and/or referrals as applicable, is 50 minutes.    Darlin Priestly, MD  [facility] 07/24/2023, 10:10 AM

## 2023-07-27 ENCOUNTER — Telehealth: Payer: Self-pay

## 2023-07-27 ENCOUNTER — Encounter (INDEPENDENT_AMBULATORY_CARE_PROVIDER_SITE_OTHER): Payer: Self-pay | Admitting: Vascular Surgery

## 2023-07-27 ENCOUNTER — Ambulatory Visit (INDEPENDENT_AMBULATORY_CARE_PROVIDER_SITE_OTHER): Payer: 59 | Admitting: Vascular Surgery

## 2023-07-27 VITALS — BP 142/58 | HR 75 | Resp 16

## 2023-07-27 DIAGNOSIS — T827XXS Infection and inflammatory reaction due to other cardiac and vascular devices, implants and grafts, sequela: Secondary | ICD-10-CM

## 2023-07-27 DIAGNOSIS — I10 Essential (primary) hypertension: Secondary | ICD-10-CM

## 2023-07-27 DIAGNOSIS — I25118 Atherosclerotic heart disease of native coronary artery with other forms of angina pectoris: Secondary | ICD-10-CM

## 2023-07-27 DIAGNOSIS — T827XXD Infection and inflammatory reaction due to other cardiac and vascular devices, implants and grafts, subsequent encounter: Secondary | ICD-10-CM | POA: Diagnosis not present

## 2023-07-27 DIAGNOSIS — E1151 Type 2 diabetes mellitus with diabetic peripheral angiopathy without gangrene: Secondary | ICD-10-CM

## 2023-07-27 DIAGNOSIS — I70213 Atherosclerosis of native arteries of extremities with intermittent claudication, bilateral legs: Secondary | ICD-10-CM

## 2023-07-27 SURGERY — APPLICATION OF WOUND VAC
Anesthesia: Choice | Laterality: Left

## 2023-07-27 NOTE — Transitions of Care (Post Inpatient/ED Visit) (Signed)
   07/27/2023  Name: Laura Reed MRN: [id number] DOB: October 14, 1960  Today's TOC FU Call Status: Today's TOC FU Call Status:: Unsuccessful Call (1st Attempt) Unsuccessful Call (1st Attempt) Date: 07/27/23  Attempted to reach the patient regarding the most recent Inpatient/ED visit.  Follow Up Plan: Additional outreach attempts will be made to reach the patient to complete the Transitions of Care (Post Inpatient/ED visit) call.   Deidre Ala, RN Medical illustrator [facility] [phone]

## 2023-07-27 NOTE — Progress Notes (Signed)
MRN : [id number]  Laura Reed is a 63 y.o. (06-Jan-1961) female who presents with chief complaint of check circulation.  History of Present Illness:   Laura Reed went home this past Friday and has done well at home.  She is moving about the house with minimal difficulty.  There have been no problems with the VAC at home.  Current Meds  Medication Sig   acetaminophen (TYLENOL) 500 MG tablet Take 1,000 mg by mouth every 6 (six) hours as needed for mild pain or headache.    acidophilus (RISAQUAD) CAPS capsule Take 2 capsules by mouth 2 (two) times daily.   amoxicillin-clavulanate (AUGMENTIN) 875-125 MG tablet Take 1 tablet by mouth every 12 (twelve) hours for 14 days.   apixaban (ELIQUIS) 2.5 MG TABS tablet Take 1 tablet (2.5 mg total) by mouth 2 (two) times daily.   aspirin 81 MG chewable tablet Chew 81 mg by mouth at bedtime.    atorvastatin (LIPITOR) 20 MG tablet TAKE 1 TABLET(20 MG) BY MOUTH DAILY   Cholecalciferol (VITAMIN D3) 2000 UNITS TABS Take 2,000 Units by mouth daily after supper.    Continuous Glucose Sensor (FREESTYLE LIBRE 2 SENSOR) MISC Use to check sugar at least 4 times daily   empagliflozin (JARDIANCE) 25 MG TABS tablet TAKE 1 TABLET(25 MG) BY MOUTH DAILY BEFORE AND BREAKFAST   Fe Fum-Vit C-Vit B12-FA (TRIGELS-F FORTE) CAPS capsule Take 1 capsule by mouth 2 (two) times daily.   gabapentin (NEURONTIN) 300 MG capsule Take 300 mg by mouth 2 (two) times daily.   glucose blood test strip Use you check blood sugars up to four times daily.   insulin degludec (TRESIBA FLEXTOUCH) 100 UNIT/ML FlexTouch Pen Inject 15 Units into the skin daily. Reduced from 22 units daily.   insulin lispro (HUMALOG KWIKPEN) 100 UNIT/ML KwikPen ADMINISTER 5-10 UNITS UNDER THE SKIN THREE TIMES DAILY if you eat a meal.  5 units if blood sugar <200, 10 units if blood sugar >200.   Insulin Pen Needle (PEN NEEDLES) 32G X 4 MM MISC Use to take  insulin daily   losartan (COZAAR) 50 MG tablet Take 50 mg by mouth daily.   Multiple Vitamin (MULTIVITAMIN) tablet Take 1 tablet by mouth daily.   oxyCODONE-acetaminophen (PERCOCET/ROXICET) 5-325 MG tablet Take 1 tablet by mouth daily as needed (with wound vac exchanges).   polycarbophil (FIBERCON) 625 MG tablet Take 1 tablet (625 mg total) by mouth 2 (two) times daily. For stool bulking.   triamcinolone ointment (KENALOG) 0.5 % Apply 1 Application topically 2 (two) times daily.    Past Medical History:  Diagnosis Date   Absence of kidney    left   Anxiety    Arthritis    Atherosclerosis of native arteries of extremities with intermittent claudication, bilateral legs (HCC)    Bladder cancer (HCC)    CHF (congestive heart failure) (HCC)    Complication of anesthesia    BP HAS  RUN LOW AFTER SURGERY-LUNGS FILLED UP WITH FLUID AFTER  LEG STENT SURGERY    Coronary artery disease    Diabetes mellitus    Family history of adverse reaction to anesthesia    Sister - PONV   GERD (gastroesophageal reflux disease)    OCC TUMS   Heart murmur  Hemorrhoid    History of methicillin resistant staphylococcus aureus (MRSA) 2007   Hypertension    Neuropathy    PVD (peripheral vascular disease) (HCC)    Thyroid nodule    right   Unspecified osteoarthritis, unspecified site    Urothelial carcinoma of kidney (HCC) 10/31/2014   INVASIVE UROTHELIAL CARCINOMA, LOW GRADE. T1, Nx.   Vitamin D deficiency, unspecified    Wears dentures    full upper and lower    Past Surgical History:  Procedure Laterality Date   AMPUTATION TOE     right (4th and 5th); left (great toe, 3rd)   AMPUTATION TOE Right 07/16/2018   Procedure: AMPUTATION TOE/MPJ right 2nd;  Surgeon: Linus Galas, DPM;  Location: [facility];  Service: Podiatry;  Laterality: Right;   APPLICATION OF WOUND VAC Left 06/26/2023   Procedure: WOUND VAC EXCHANGE LEFT GROIN;  Surgeon: Renford Dills, MD;  Location: [facility];  Service: Vascular;   Laterality: Left;   APPLICATION OF WOUND VAC Left 06/29/2023   Procedure: WOUND VAC EXCHANGE LEFT GROIN;  Surgeon: Renford Dills, MD;  Location: [facility];  Service: Vascular;  Laterality: Left;   APPLICATION OF WOUND VAC Left 07/02/2023   Procedure: WOUND VAC EXCHANGE LEFT GROIN;  Surgeon: Annice Needy, MD;  Location: [facility];  Service: Vascular;  Laterality: Left;   APPLICATION OF WOUND VAC Left 07/06/2023   Procedure: WOUND VAC EXCHANGE LEFT GROIN;  Surgeon: Renford Dills, MD;  Location: [facility];  Service: Vascular;  Laterality: Left;   APPLICATION OF WOUND VAC Left 07/09/2023   Procedure: WOUND VAC EXCHANGE OF LEFT GROIN;  Surgeon: Annice Needy, MD;  Location: [facility];  Service: Vascular;  Laterality: Left;   APPLICATION OF WOUND VAC Left 07/13/2023   Procedure: WOUND VAC EXCHANGE LEFT GROIN;  Surgeon: Renford Dills, MD;  Location: [facility];  Service: Vascular;  Laterality: Left;   APPLICATION OF WOUND VAC Left 07/16/2023   Procedure: WOUND VAC EXCHANGE LEFT GROIN;  Surgeon: Renford Dills, MD;  Location: [facility];  Service: Vascular;  Laterality: Left;   APPLICATION OF WOUND VAC Left 07/20/2023   Procedure: WOUND VAC EXCHANGE;  Surgeon: Renford Dills, MD;  Location: [facility];  Service: Vascular;  Laterality: Left;   APPLICATION OF WOUND VAC Left 07/23/2023   Procedure: WOUND VAC EXCHANGE;  Surgeon: Renford Dills, MD;  Location: [facility];  Service: Vascular;  Laterality: Left;   ARTERIAL BYPASS SURGRY  2009, 2013 x 2   right leg , done in [place]   CARDIAC CATHETERIZATION     CAROTID ENDARTERECTOMY Right 01/2014   Dr Gilda Crease   CATARACT EXTRACTION W/PHACO Right 12/14/2014   Procedure: CATARACT EXTRACTION PHACO AND INTRAOCULAR LENS PLACEMENT (IOC);  Surgeon: Lia Hopping, MD;  Location: [facility];  Service: Ophthalmology;  Laterality: Right;  [place]   00:38.6              AP        7.1                   CDE  2.76   CATARACT EXTRACTION W/PHACO Left 12/06/2019   Procedure:  CATARACT EXTRACTION PHACO AND INTRAOCULAR LENS PLACEMENT (IOC) LEFT DIABETIC;  Surgeon: Galen Manila, MD;  Location: [facility];  Service: Ophthalmology;  Laterality: Left;  9.08 1:06.4   CESAREAN SECTION     CHOLECYSTECTOMY  03-03-12   Porcelain gallbladder, gallstones,  [place]   COLONOSCOPY W/ BIOPSIES  04/28/2012  Hyperplastic rectal polyps.   COLONOSCOPY WITH PROPOFOL N/A 04/02/2022   Procedure: COLONOSCOPY WITH PROPOFOL;  Surgeon: Earline Mayotte, MD;  Location: [facility];  Service: Endoscopy;  Laterality: N/A;   CORONARY ARTERY BYPASS GRAFT  2009   3 vessel   CYSTOSCOPY W/ RETROGRADES Right 09/01/2016   Procedure: CYSTOSCOPY WITH RETROGRADE PYELOGRAM;  Surgeon: Vanna Scotland, MD;  Location: [facility];  Service: Urology;  Laterality: Right;   CYSTOSCOPY W/ RETROGRADES Bilateral 03/19/2020   Procedure: CYSTOSCOPY WITH RETROGRADE PYELOGRAM;  Surgeon: Vanna Scotland, MD;  Location: [facility];  Service: Urology;  Laterality: Bilateral;   CYSTOSCOPY WITH BIOPSY N/A 03/19/2020   Procedure: CYSTOSCOPY WITH BIOPSY;  Surgeon: Vanna Scotland, MD;  Location: [facility];  Service: Urology;  Laterality: N/A;   EYE SURGERY     FEMORAL-POPLITEAL BYPASS GRAFT Left 04/29/2023   Procedure: BYPASS GRAFT FEMORAL-POPLITEAL ARTERY;  Surgeon: Renford Dills, MD;  Location: [facility];  Service: Vascular;  Laterality: Left;   HERNIA REPAIR  10-31-14   ventral, retro-rectus atrium mesh   INCISION AND DRAINAGE ABSCESS Left 06/24/2023   Procedure: INCISION AND DRAINAGE WITH SATORIUS FLAP;  Surgeon: Renford Dills, MD;  Location: [facility];  Service: Vascular;  Laterality: Left;   IRRIGATION AND DEBRIDEMENT FOOT Left 01/18/2019   Procedure: IRRIGATION AND DEBRIDEMENT FOOT;  Surgeon: Linus Galas, DPM;  Location: [facility];  Service: Podiatry;  Laterality: Left;   LOWER EXTREMITY ANGIOGRAPHY Left 12/10/2016   Procedure: Lower Extremity Angiography;  Surgeon: Renford Dills, MD;  Location:  [facility];  Service: Cardiovascular;  Laterality: Left;   LOWER EXTREMITY ANGIOGRAPHY Left 02/02/2018   Procedure: LOWER EXTREMITY ANGIOGRAPHY;  Surgeon: Renford Dills, MD;  Location: [facility];  Service: Cardiovascular;  Laterality: Left;   LOWER EXTREMITY ANGIOGRAPHY Left 05/05/2018   Procedure: LOWER EXTREMITY ANGIOGRAPHY;  Surgeon: Renford Dills, MD;  Location: [facility];  Service: Cardiovascular;  Laterality: Left;   LOWER EXTREMITY ANGIOGRAPHY Left 12/04/2020   Procedure: LOWER EXTREMITY ANGIOGRAPHY with Intervention;  Surgeon: Renford Dills, MD;  Location: [facility];  Service: Cardiovascular;  Laterality: Left;   LOWER EXTREMITY ANGIOGRAPHY Left 04/24/2023   Procedure: Lower Extremity Angiography;  Surgeon: Renford Dills, MD;  Location: [facility];  Service: Cardiovascular;  Laterality: Left;   LOWER EXTREMITY ANGIOGRAPHY Left 05/01/2023   Procedure: Lower Extremity Angiography;  Surgeon: Renford Dills, MD;  Location: [facility];  Service: Cardiovascular;  Laterality: Left;   NEPHRECTOMY Left 10-31-14   PERIPHERAL VASCULAR CATHETERIZATION Left 05/01/2015   Procedure: Lower Extremity Angiography;  Surgeon: Renford Dills, MD;  Location: [facility];  Service: Cardiovascular;  Laterality: Left;   PERIPHERAL VASCULAR CATHETERIZATION  05/01/2015   Procedure: Lower Extremity Intervention;  Surgeon: Renford Dills, MD;  Location: [facility];  Service: Cardiovascular;;   PERIPHERAL VASCULAR CATHETERIZATION Left 02/20/2015   Procedure: Pelvic Angiography;  Surgeon: Renford Dills, MD;  Location: [facility];  Service: Cardiovascular;  Laterality: Left;   TRANSMETATARSAL AMPUTATION Left 05/05/2023   Procedure: TRANSMETATARSAL AMPUTATION LEFT;  Surgeon: Gwyneth Revels, DPM;  Location: [facility];  Service: Orthopedics/Podiatry;  Laterality: Left;   TRANSURETHRAL RESECTION OF  BLADDER TUMOR WITH MITOMYCIN-C N/A 09/01/2016   Procedure: TRANSURETHRAL RESECTION OF BLADDER TUMOR WITH MITOMYCIN-C;  Surgeon: Vanna Scotland, MD;  Location: [facility];  Service: Urology;  Laterality: N/A;    Social History Social History   Tobacco Use  Smoking status: Former    Current packs/day: 0.00    Average packs/day: 2.0 packs/day for 35.0 years (70.0 ttl pk-yrs)    Types: Cigarettes    Start date: 03/29/1978    Quit date: 03/29/2013    Years since quitting: 10.3   Smokeless tobacco: Never  Vaping Use   Vaping status: Former  Substance Use Topics   Alcohol use: Not Currently    Alcohol/week: 0.0 standard drinks of alcohol    Comment: LAST DRINK 2009   Drug use: Not Currently    Types: Cocaine    Comment: last used in 2007    Family History Family History  Problem Relation Age of Onset   Cancer Mother 84       Lung Cancer   Cancer Father 32       Lung Ca   Diabetes Son    Breast cancer Maternal Grandmother    Kidney cancer Neg Hx    Bladder Cancer Neg Hx    Prostate cancer Neg Hx     No Known Allergies   REVIEW OF SYSTEMS (Negative unless checked)  Constitutional: [] Weight loss  [] Fever  [] Chills Cardiac: [] Chest pain   [] Chest pressure   [] Palpitations   [] Shortness of breath when laying flat   [] Shortness of breath with exertion. Vascular:  [x] Pain in legs with walking   [] Pain in legs at rest  [] History of DVT   [] Phlebitis   [] Swelling in legs   [] Varicose veins   [] Non-healing ulcers Pulmonary:   [] Uses home oxygen   [] Productive cough   [] Hemoptysis   [] Wheeze  [] COPD   [] Asthma Neurologic:  [] Dizziness   [] Seizures   [] History of stroke   [] History of TIA  [] Aphasia   [] Vissual changes   [] Weakness or numbness in arm   [] Weakness or numbness in leg Musculoskeletal:   [] Joint swelling   [] Joint pain   [] Low back pain Hematologic:  [] Easy bruising  [] Easy bleeding   [] Hypercoagulable state   [] Anemic Gastrointestinal:  [] Diarrhea   [] Vomiting   [] Gastroesophageal reflux/heartburn   [] Difficulty swallowing. Genitourinary:  [] Chronic kidney disease   [] Difficult urination  [] Frequent urination   [] Blood in urine Skin:  [] Rashes   [] Ulcers  Psychological:  [] History of anxiety   []  History of major depression.  Physical Examination  Vitals:   07/27/23 1407  BP: (!) 142/58  Pulse: 75  Resp: 16   There is no height or weight on file to calculate BMI. Gen: WD/WN, NAD Head: [place]/AT, No temporalis wasting.  Ear/Nose/Throat: Hearing grossly intact, nares w/o erythema or drainage Eyes: PER, EOMI, sclera nonicteric.  Neck: Supple, no masses.  No bruit or JVD.  Pulmonary:  Good air movement, no audible wheezing, no use of accessory muscles.  Cardiac: RRR, normal S1, S2, no Murmurs. Vascular: VAC dressing change performed here in the office see below  Radial Palpable Palpable  PT Not Palpable Not Palpable  DP Not Palpable  Palpable  Gastrointestinal: soft, non-distended. No guarding/no peritoneal signs.  Musculoskeletal: M/S 5/5 throughout.  No visible deformity.  Neurologic: CN 2-12 intact. Pain and light touch intact in extremities.  Symmetrical.  Speech is fluent. Motor exam as listed above. Psychiatric: Judgment intact, Mood & affect appropriate for pt's clinical situation. Dermatologic: No rashes or ulcers noted.  No changes consistent with cellulitis.   CBC Lab Results  Component Value Date   WBC 10.3 07/24/2023   HGB 9.8 (L) 07/24/2023   HCT 30.2 (L) 07/24/2023   MCV 88.0 07/24/2023  PLT 380 07/24/2023    BMET    Component Value Date/Time   NA 136 07/24/2023 0505   NA 136 08/02/2021 1629   NA 135 11/02/2014 0609   K 3.4 (L) 07/24/2023 0505   K 4.2 11/02/2014 0609   CL 103 07/24/2023 0505   CL 107 11/02/2014 0609   CO2 24 07/24/2023 0505   CO2 23 11/02/2014 0609   GLUCOSE 293 (H) 07/24/2023 0505   GLUCOSE 108 (H) 11/02/2014 0609   BUN 25 (H) 07/24/2023 0505   BUN 25 08/02/2021 1629   BUN 20 11/02/2014 0609    CREATININE 0.79 07/24/2023 0505   CREATININE 1.01 11/09/2015 1549   CREATININE 1.01 11/09/2015 1549   CALCIUM 8.1 (L) 07/24/2023 0505   CALCIUM 7.3 (L) 11/02/2014 0609   GFRNONAA >60 07/24/2023 0505   GFRNONAA 50 (L) 11/02/2014 0609   GFRAA >60 01/19/2019 0357   GFRAA 58 (L) 11/02/2014 0609   Estimated Creatinine Clearance: 75.8 mL/min (by C-G formula based on SCr of 0.79 mg/dL).  COAG Lab Results  Component Value Date   INR 1.1 06/21/2023   INR 0.9 10/17/2014   INR 1.1 01/19/2014    Radiology No results found.   Assessment/Plan 1. Vascular graft infection, sequela (Primary) Here in the office the patient's VAC dressing is removed.  The wound is clean and well granulating.  The wound is measured and it is 13 cm in length by 4 cm in width.  Adaptic followed by a new black VAC sponge is placed excellent seal is noted in the office.  Patient will follow-up on Thursday for a VAC dressing change.  Of note there was minimal drainage in the [facility] canister.  2. Atherosclerosis of native artery of both lower extremities with intermittent claudication (HCC)  Recommend:  The patient has evidence of atherosclerosis of the lower extremities with claudication.  The patient does not voice lifestyle limiting changes at this point in time.  At the present time we seem to be healing the wound.  The graft itself is no longer visible.  Drainage is minimal.  Will continue VAC dressing changes for now.  No invasive studies, angiography or surgery at this time The patient should continue walking and begin a more formal exercise program.  The patient should continue antiplatelet therapy and aggressive treatment of the lipid abnormalities  No changes in the patient's medications at this time  Continued surveillance is indicated as atherosclerosis is likely to progress with time.    The patient will continue follow up with noninvasive studies as ordered.   3. Controlled type 2 DM with peripheral  circulatory disorder (HCC) Continue hypoglycemic medications as already ordered, these medications have been reviewed and there are no changes at this time.  Hgb A1C to be monitored as already arranged by primary service  4. Primary hypertension Continue antihypertensive medications as already ordered, these medications have been reviewed and there are no changes at this time.  5. Coronary artery disease of native artery of native heart with stable angina pectoris (HCC) Continue cardiac and antihypertensive medications as already ordered and reviewed, no changes at this time.  Continue statin as ordered and reviewed, no changes at this time  Nitrates PRN for chest pain    [name], MD  07/27/2023 2:54 PM

## 2023-07-28 ENCOUNTER — Telehealth: Payer: Self-pay

## 2023-07-28 NOTE — Transitions of Care (Post Inpatient/ED Visit) (Signed)
   07/28/2023  Name: Laura Reed MRN: [id number] DOB: [birth year]  Today's TOC FU Call Status: Today's TOC FU Call Status:: Unsuccessful Call (2nd Attempt) Unsuccessful Call (1st Attempt) Date: 07/27/23 Unsuccessful Call (2nd Attempt) Date: 07/28/23  Attempted to reach the patient regarding the most recent Inpatient/ED visit.Patient was called in an Outreach attempt to offer [facility]  30-day TOC program. Pt is eligible for program due to potential risk for readmission and/or high utilization. Unfortunately, I was not able to speak with the patient in regards to recent hospital discharge   Left message for patient including VBCI CM contact information with request for a call back in regard to recent hospital discharge    Follow Up Plan: Additional outreach attempts will be made to reach the patient to complete the Transitions of Care (Post Inpatient/ED visit) call.    Susa Loffler , BSN, RN [facility]   [facility] RN Care Manager Direct Dial [phone] Fax [phone] Website: [email or web address]

## 2023-07-29 ENCOUNTER — Telehealth: Payer: Self-pay

## 2023-07-29 DIAGNOSIS — E871 Hypo-osmolality and hyponatremia: Secondary | ICD-10-CM | POA: Diagnosis not present

## 2023-07-29 DIAGNOSIS — Z85528 Personal history of other malignant neoplasm of kidney: Secondary | ICD-10-CM | POA: Diagnosis not present

## 2023-07-29 DIAGNOSIS — I110 Hypertensive heart disease with heart failure: Secondary | ICD-10-CM | POA: Diagnosis not present

## 2023-07-29 DIAGNOSIS — R197 Diarrhea, unspecified: Secondary | ICD-10-CM | POA: Diagnosis not present

## 2023-07-29 DIAGNOSIS — Z9181 History of falling: Secondary | ICD-10-CM | POA: Diagnosis not present

## 2023-07-29 DIAGNOSIS — Z87891 Personal history of nicotine dependence: Secondary | ICD-10-CM | POA: Diagnosis not present

## 2023-07-29 DIAGNOSIS — M79605 Pain in left leg: Secondary | ICD-10-CM | POA: Diagnosis not present

## 2023-07-29 DIAGNOSIS — E1142 Type 2 diabetes mellitus with diabetic polyneuropathy: Secondary | ICD-10-CM | POA: Diagnosis not present

## 2023-07-29 DIAGNOSIS — E785 Hyperlipidemia, unspecified: Secondary | ICD-10-CM | POA: Diagnosis not present

## 2023-07-29 DIAGNOSIS — Z794 Long term (current) use of insulin: Secondary | ICD-10-CM | POA: Diagnosis not present

## 2023-07-29 DIAGNOSIS — Z89422 Acquired absence of other left toe(s): Secondary | ICD-10-CM | POA: Diagnosis not present

## 2023-07-29 DIAGNOSIS — E1169 Type 2 diabetes mellitus with other specified complication: Secondary | ICD-10-CM | POA: Diagnosis not present

## 2023-07-29 DIAGNOSIS — I2510 Atherosclerotic heart disease of native coronary artery without angina pectoris: Secondary | ICD-10-CM | POA: Diagnosis not present

## 2023-07-29 DIAGNOSIS — I5032 Chronic diastolic (congestive) heart failure: Secondary | ICD-10-CM | POA: Diagnosis not present

## 2023-07-29 DIAGNOSIS — D649 Anemia, unspecified: Secondary | ICD-10-CM | POA: Diagnosis not present

## 2023-07-29 DIAGNOSIS — D72829 Elevated white blood cell count, unspecified: Secondary | ICD-10-CM | POA: Diagnosis not present

## 2023-07-29 DIAGNOSIS — Z8551 Personal history of malignant neoplasm of bladder: Secondary | ICD-10-CM | POA: Diagnosis not present

## 2023-07-29 DIAGNOSIS — L02214 Cutaneous abscess of groin: Secondary | ICD-10-CM | POA: Diagnosis not present

## 2023-07-29 DIAGNOSIS — E1151 Type 2 diabetes mellitus with diabetic peripheral angiopathy without gangrene: Secondary | ICD-10-CM | POA: Diagnosis not present

## 2023-07-29 DIAGNOSIS — T8149XA Infection following a procedure, other surgical site, initial encounter: Secondary | ICD-10-CM | POA: Diagnosis not present

## 2023-07-29 DIAGNOSIS — Z905 Acquired absence of kidney: Secondary | ICD-10-CM | POA: Diagnosis not present

## 2023-07-29 DIAGNOSIS — I6529 Occlusion and stenosis of unspecified carotid artery: Secondary | ICD-10-CM | POA: Diagnosis not present

## 2023-07-29 DIAGNOSIS — Z7982 Long term (current) use of aspirin: Secondary | ICD-10-CM | POA: Diagnosis not present

## 2023-07-29 NOTE — Transitions of Care (Post Inpatient/ED Visit) (Signed)
07/29/2023  Name: Laura Reed MRN: [id number] DOB: 08/21/1960  Today's TOC FU Call Status: Today's TOC FU Call Status:: Successful TOC FU Call Completed Unsuccessful Call (1st Attempt) Date: 07/27/23 Unsuccessful Call (2nd Attempt) Date: 07/28/23 [facility] FU Call Complete Date: 07/29/23 Patient's Name and Date of Birth confirmed.  Transition Care Management Follow-up Telephone Call Date of Discharge: 07/24/23 Discharge Facility: [facility]) Type of Discharge: Inpatient Admission Primary Inpatient Discharge Diagnosis:: Left groin abscess  Sepsis with septic shock resolved How have you been since you were released from the hospital?: Better Any questions or concerns?: No  Items Reviewed: Did you receive and understand the discharge instructions provided?: Yes Medications obtained,verified, and reconciled?: Yes (Medications Reviewed) (Medication reconciliation completed based on recent discharge summary Patient taking medications as instructed and is aware of any changes or dosage adjustments medication regimen. Patient denies questions and reports no barriers to medication adherence) Any new allergies since your discharge?: No Dietary orders reviewed?: Yes Type of Diet Ordered:: Reg Heart healthy Do you have support at home?: Yes People in Home: alone, sibling(s) Name of Support/Comfort Primary Source: Sister sharon lives close  Medications Reviewed Today: Medications Reviewed Today     Reviewed by Johnnette Barrios, RN (Registered Nurse) on 07/29/23 at 1114  Med List Status: <None>   Medication Order Taking? Sig Documenting Provider Last Dose Status Informant  acetaminophen (TYLENOL) 500 MG tablet [id number] Yes Take 1,000 mg by mouth every 6 (six) hours as needed for mild pain or headache.  [provider] Taking Active Self           Med Note Feliz Beam, Arie Sabina   Tue Sep 25, 2020 10:02 AM)    acidophilus (RISAQUAD) CAPS capsule [id number] Yes  Take 2 capsules by mouth 2 (two) times daily. Darlin Priestly, MD Taking Active   amoxicillin-clavulanate (AUGMENTIN) 875-125 MG tablet [id number] Yes Take 1 tablet by mouth every 12 (twelve) hours for 14 days. Darlin Priestly, MD Taking Active   apixaban Everlene Balls) 2.5 MG TABS tablet [id number] Yes Take 1 tablet (2.5 mg total) by mouth 2 (two) times daily. Marcie Bal, NP Taking Active Self  aspirin 81 MG chewable tablet [id number] Yes Chew 81 mg by mouth at bedtime.  [provider] Taking Active Self  atorvastatin (LIPITOR) 20 MG tablet [id number] Yes TAKE 1 TABLET(20 MG) BY MOUTH DAILY Sherlene Shams, MD Taking Active Self  Cholecalciferol (VITAMIN D3) 2000 UNITS TABS [id number] Yes Take 2,000 Units by mouth daily after supper.  [provider] Taking Active Self  Continuous Glucose Sensor (FREESTYLE LIBRE 2 SENSOR) [place] [id number] Yes Use to check sugar at least 4 times daily Sherlene Shams, MD Taking Active Self  empagliflozin (JARDIANCE) 25 MG TABS tablet [id number] Yes TAKE 1 TABLET(25 MG) BY MOUTH DAILY BEFORE AND BREAKFAST Sherlene Shams, MD Taking Active Self  Fe Fum-Vit C-Vit B12-FA (TRIGELS-F FORTE) CAPS capsule [id number] Yes Take 1 capsule by mouth 2 (two) times daily. Darlin Priestly, MD Taking Active   gabapentin (NEURONTIN) 300 MG capsule [id number] Yes Take 300 mg by mouth 2 (two) times daily. [provider] Taking Active Self  glucose blood test strip [id number] Yes Use you check blood sugars up to four times daily. Sherlene Shams, MD Taking Active Self  insulin degludec (TRESIBA FLEXTOUCH) 100 UNIT/ML FlexTouch Pen [id number] Yes Inject 15 Units into the skin daily. Reduced from 22 units daily. Darlin Priestly, MD Taking Active   insulin lispro [facility]) 100  UNIT/ML KwikPen [id number] Yes ADMINISTER 5-10 UNITS UNDER THE SKIN THREE TIMES DAILY if you eat a meal.  5 units if blood sugar <200, 10 units if blood sugar >200. Darlin Priestly, MD Taking Active   Insulin Pen Needle (PEN  NEEDLES) 32G X 4 MM MISC [id number] Yes Use to take insulin daily Sherlene Shams, MD Taking Active Self  losartan (COZAAR) 50 MG tablet [id number] Yes Take 50 mg by mouth daily. [provider] Taking Active Self  Multiple Vitamin (MULTIVITAMIN) tablet [id number] Yes Take 1 tablet by mouth daily. [provider] Taking Active Self  oxyCODONE-acetaminophen (PERCOCET/ROXICET) 5-325 MG tablet [id number] Yes Take 1 tablet by mouth daily as needed (with wound vac exchanges). Darlin Priestly, MD Taking Active   polycarbophil (FIBERCON) 625 MG tablet [id number] Yes Take 1 tablet (625 mg total) by mouth 2 (two) times daily. For stool bulking. Darlin Priestly, MD Taking Active   triamcinolone ointment (KENALOG) 0.5 % [id number] Yes Apply 1 Application topically 2 (two) times daily. Sherlene Shams, MD Taking Active Self            Home Care and Equipment/Supplies: Were Home Health Services Ordered?: Yes Name of Home Health Agency:: [facility] Has Agency set up a time to come to your home?: Yes First Home Health Visit Date: 07/27/23 (vist today 1/22) Any new equipment or medical supplies ordered?: Yes Name of Medical supply agency?: [facility] Were you able to get the equipment/medical supplies?: Yes (Discharged with wound vac.  TOC was not able to find a [facility] agency willing to perform home wound vac dressing change, pt therefore will go to vascular clinic Monday  Thursday for dressing changes.Dressing supplies were delivered to pt before discharge.) Do you have any questions related to the use of the equipment/supplies?: No (pt  will go to vascular clinic Monday and Thursday to have her dressing changed.  Dressing supplies were delivered to pt before discharge.)  Functional Questionnaire: Do you need assistance with bathing/showering or dressing?: No Do you need assistance with meal preparation?: No Do you need assistance with eating?: No Do you have difficulty maintaining continence: No Do you need  assistance with getting out of bed/getting out of a chair/moving?: No Do you have difficulty managing or taking your medications?: No  Follow up appointments reviewed: PCP Follow-up appointment confirmed?: No (Per patient Sherlene Shams deferred to Vascular) MD Provider Line [phone] Given: No Specialist Hospital Follow-up appointment confirmed?: Yes Date of Specialist follow-up appointment?: 07/27/23 Follow-Up Specialty Provider:: [name], Latina Craver, MD    Physician  Vascular Surgery Do you need transportation to your follow-up appointment?: No Do you understand care options if your condition(s) worsen?: Yes-patient verbalized understanding  SDOH Interventions Today    Flowsheet Row Most Recent Value  SDOH Interventions   Food Insecurity Interventions Intervention Not Indicated  Housing Interventions Intervention Not Indicated  Transportation Interventions Intervention Not Indicated, Patient Resources (Friends/Family)  Utilities Interventions Intervention Not Indicated  Social Connections Interventions Intervention Not Indicated, Patient Declined      Interventions Today    Flowsheet Row Most Recent Value  Chronic Disease   Chronic disease during today's visit Diabetes, Other  [wound care]  General Interventions   General Interventions Discussed/Reviewed General Interventions Discussed, Doctor Visits, Referral to Nurse  Doctor Visits Discussed/Reviewed Doctor Visits Reviewed  Exercise Interventions   Exercise Discussed/Reviewed Physical Activity  Physical Activity Discussed/Reviewed Physical Activity Reviewed  Nutrition Interventions   Nutrition Discussed/Reviewed Nutrition Reviewed  Pharmacy Interventions   Pharmacy Dicussed/Reviewed Medications  and their functions       Benefits reviewed  Based on current information and Insurance plan -Reviewed benefits accessible to patient, including details about eligibility options for care and  available value based care  options  if any areas of needs were identified.  Reviewed patient/  caregiver's ability to access and / or  ability with navigating the benefits system..Amb Referral made if indicted , refer to orders section of note for details   Reviewed goals for care Patient/ Caregiver  verbalizes understanding of instructions and care plan provided. Patient / Caregiver was encouraged to make informed decisions about their care, actively participate in managing their health condition, and implement lifestyle changes as needed to promote independence and self-management of health care. There were no reported  barriers to care.   TOC program  Patient is at high risk for readmission and/or has history of  high utilization  Discussed [facility]  TOC program and weekly calls to patient to assess condition/status, medication management  and provide support/education as indicated . Patient/ Caregiver voiced understanding and declined enrollment in the 30-day TOC Program.   Patient is doing well She is followed by [facility] and will be seeing vascular surgeon 2 x week for wound vac changes She feels this is sufficient to manage her needs . Will defer to [facility] for ongoing follow-up     The patient has been provided with contact information for the care management team and has been advised to call with any health-related questions or concerns. Follow up as indicated with Care Team , or sooner should any new problems arise.      Susa Loffler , BSN, RN [facility]   [facility] RN Care Manager Direct Dial [phone] Fax [phone] Website: [email or web address]

## 2023-07-30 ENCOUNTER — Encounter (INDEPENDENT_AMBULATORY_CARE_PROVIDER_SITE_OTHER): Payer: Self-pay

## 2023-07-30 ENCOUNTER — Ambulatory Visit (INDEPENDENT_AMBULATORY_CARE_PROVIDER_SITE_OTHER): Payer: 59 | Admitting: Vascular Surgery

## 2023-07-30 VITALS — BP 146/59 | HR 65 | Resp 16

## 2023-07-30 DIAGNOSIS — E1151 Type 2 diabetes mellitus with diabetic peripheral angiopathy without gangrene: Secondary | ICD-10-CM

## 2023-07-30 DIAGNOSIS — I7025 Atherosclerosis of native arteries of other extremities with ulceration: Secondary | ICD-10-CM | POA: Diagnosis not present

## 2023-07-30 DIAGNOSIS — T827XXD Infection and inflammatory reaction due to other cardiac and vascular devices, implants and grafts, subsequent encounter: Secondary | ICD-10-CM | POA: Diagnosis not present

## 2023-07-30 DIAGNOSIS — T827XXS Infection and inflammatory reaction due to other cardiac and vascular devices, implants and grafts, sequela: Secondary | ICD-10-CM

## 2023-07-30 DIAGNOSIS — I10 Essential (primary) hypertension: Secondary | ICD-10-CM

## 2023-07-30 NOTE — Progress Notes (Signed)
MRN : [id number]  Laura Reed is a 63 y.o. (08-05-1960) female who presents with chief complaint of check circulation.  History of Present Illness:   Patient returns for evaluation of her left groin wound.  She denies pain.  She denies discharge or fever chills.  No leg pain.  Current Meds  Medication Sig   acetaminophen (TYLENOL) 500 MG tablet Take 1,000 mg by mouth every 6 (six) hours as needed for mild pain or headache.    acidophilus (RISAQUAD) CAPS capsule Take 2 capsules by mouth 2 (two) times daily.   amoxicillin-clavulanate (AUGMENTIN) 875-125 MG tablet Take 1 tablet by mouth every 12 (twelve) hours for 14 days.   apixaban (ELIQUIS) 2.5 MG TABS tablet Take 1 tablet (2.5 mg total) by mouth 2 (two) times daily.   aspirin 81 MG chewable tablet Chew 81 mg by mouth at bedtime.    atorvastatin (LIPITOR) 20 MG tablet TAKE 1 TABLET(20 MG) BY MOUTH DAILY   Cholecalciferol (VITAMIN D3) 2000 UNITS TABS Take 2,000 Units by mouth daily after supper.    Continuous Glucose Sensor (FREESTYLE LIBRE 2 SENSOR) MISC Use to check sugar at least 4 times daily   empagliflozin (JARDIANCE) 25 MG TABS tablet TAKE 1 TABLET(25 MG) BY MOUTH DAILY BEFORE AND BREAKFAST   Fe Fum-Vit C-Vit B12-FA (TRIGELS-F FORTE) CAPS capsule Take 1 capsule by mouth 2 (two) times daily.   gabapentin (NEURONTIN) 300 MG capsule Take 300 mg by mouth 2 (two) times daily.   glucose blood test strip Use you check blood sugars up to four times daily.   insulin degludec (TRESIBA FLEXTOUCH) 100 UNIT/ML FlexTouch Pen Inject 15 Units into the skin daily. Reduced from 22 units daily.   insulin lispro (HUMALOG KWIKPEN) 100 UNIT/ML KwikPen ADMINISTER 5-10 UNITS UNDER THE SKIN THREE TIMES DAILY if you eat a meal.  5 units if blood sugar <200, 10 units if blood sugar >200.   Insulin Pen Needle (PEN NEEDLES) 32G X 4 MM MISC Use to take insulin daily   losartan (COZAAR) 50  MG tablet Take 50 mg by mouth daily.   Multiple Vitamin (MULTIVITAMIN) tablet Take 1 tablet by mouth daily.   oxyCODONE-acetaminophen (PERCOCET/ROXICET) 5-325 MG tablet Take 1 tablet by mouth daily as needed (with wound vac exchanges).   polycarbophil (FIBERCON) 625 MG tablet Take 1 tablet (625 mg total) by mouth 2 (two) times daily. For stool bulking.   triamcinolone ointment (KENALOG) 0.5 % Apply 1 Application topically 2 (two) times daily.    Past Medical History:  Diagnosis Date   Absence of kidney    left   Anxiety    Arthritis    Atherosclerosis of native arteries of extremities with intermittent claudication, bilateral legs (HCC)    Bladder cancer (HCC)    CHF (congestive heart failure) (HCC)    Complication of anesthesia    BP HAS  RUN LOW AFTER SURGERY-LUNGS FILLED UP WITH FLUID AFTER  LEG STENT SURGERY    Coronary artery disease    Diabetes mellitus    Family history of adverse reaction to anesthesia    Sister -  PONV   GERD (gastroesophageal reflux disease)    OCC TUMS   Heart murmur    Hemorrhoid    History of methicillin resistant staphylococcus aureus (MRSA) 2007   Hypertension    Neuropathy    PVD (peripheral vascular disease) (HCC)    Thyroid nodule    right   Unspecified osteoarthritis, unspecified site    Urothelial carcinoma of kidney (HCC) 10/31/2014   INVASIVE UROTHELIAL CARCINOMA, LOW GRADE. T1, Nx.   Vitamin D deficiency, unspecified    Wears dentures    full upper and lower    Past Surgical History:  Procedure Laterality Date   AMPUTATION TOE     right (4th and 5th); left (great toe, 3rd)   AMPUTATION TOE Right 07/16/2018   Procedure: AMPUTATION TOE/MPJ right 2nd;  Surgeon: Linus Galas, DPM;  Location: [facility];  Service: Podiatry;  Laterality: Right;   APPLICATION OF WOUND VAC Left 06/26/2023   Procedure: WOUND VAC EXCHANGE LEFT GROIN;  Surgeon: Renford Dills, MD;  Location: [facility];  Service: Vascular;  Laterality: Left;   APPLICATION OF  WOUND VAC Left 06/29/2023   Procedure: WOUND VAC EXCHANGE LEFT GROIN;  Surgeon: Renford Dills, MD;  Location: [facility];  Service: Vascular;  Laterality: Left;   APPLICATION OF WOUND VAC Left 07/02/2023   Procedure: WOUND VAC EXCHANGE LEFT GROIN;  Surgeon: Annice Needy, MD;  Location: [facility];  Service: Vascular;  Laterality: Left;   APPLICATION OF WOUND VAC Left 07/06/2023   Procedure: WOUND VAC EXCHANGE LEFT GROIN;  Surgeon: Renford Dills, MD;  Location: [facility];  Service: Vascular;  Laterality: Left;   APPLICATION OF WOUND VAC Left 07/09/2023   Procedure: WOUND VAC EXCHANGE OF LEFT GROIN;  Surgeon: Annice Needy, MD;  Location: [facility];  Service: Vascular;  Laterality: Left;   APPLICATION OF WOUND VAC Left 07/13/2023   Procedure: WOUND VAC EXCHANGE LEFT GROIN;  Surgeon: Renford Dills, MD;  Location: [facility];  Service: Vascular;  Laterality: Left;   APPLICATION OF WOUND VAC Left 07/16/2023   Procedure: WOUND VAC EXCHANGE LEFT GROIN;  Surgeon: Renford Dills, MD;  Location: [facility];  Service: Vascular;  Laterality: Left;   APPLICATION OF WOUND VAC Left 07/20/2023   Procedure: WOUND VAC EXCHANGE;  Surgeon: Renford Dills, MD;  Location: [facility];  Service: Vascular;  Laterality: Left;   APPLICATION OF WOUND VAC Left 07/23/2023   Procedure: WOUND VAC EXCHANGE;  Surgeon: Renford Dills, MD;  Location: [facility];  Service: Vascular;  Laterality: Left;   ARTERIAL BYPASS SURGRY  2009, 2013 x 2   right leg , done in [place]   CARDIAC CATHETERIZATION     CAROTID ENDARTERECTOMY Right 01/2014   Dr Gilda Crease   CATARACT EXTRACTION W/PHACO Right 12/14/2014   Procedure: CATARACT EXTRACTION PHACO AND INTRAOCULAR LENS PLACEMENT (IOC);  Surgeon: Lia Hopping, MD;  Location: [facility];  Service: Ophthalmology;  Laterality: Right;  [place]   00:38.6              AP        7.1                   CDE  2.76   CATARACT EXTRACTION W/PHACO Left 12/06/2019   Procedure: CATARACT EXTRACTION PHACO AND  INTRAOCULAR LENS PLACEMENT (IOC) LEFT DIABETIC;  Surgeon: Galen Manila, MD;  Location: [facility];  Service: Ophthalmology;  Laterality: Left;  9.08 1:06.4   CESAREAN SECTION  CHOLECYSTECTOMY  03-03-12   Porcelain gallbladder, gallstones,  [place]   COLONOSCOPY W/ BIOPSIES  04/28/2012   Hyperplastic rectal polyps.   COLONOSCOPY WITH PROPOFOL N/A 04/02/2022   Procedure: COLONOSCOPY WITH PROPOFOL;  Surgeon: Earline Mayotte, MD;  Location: [facility];  Service: Endoscopy;  Laterality: N/A;   CORONARY ARTERY BYPASS GRAFT  2009   3 vessel   CYSTOSCOPY W/ RETROGRADES Right 09/01/2016   Procedure: CYSTOSCOPY WITH RETROGRADE PYELOGRAM;  Surgeon: Vanna Scotland, MD;  Location: [facility];  Service: Urology;  Laterality: Right;   CYSTOSCOPY W/ RETROGRADES Bilateral 03/19/2020   Procedure: CYSTOSCOPY WITH RETROGRADE PYELOGRAM;  Surgeon: Vanna Scotland, MD;  Location: [facility];  Service: Urology;  Laterality: Bilateral;   CYSTOSCOPY WITH BIOPSY N/A 03/19/2020   Procedure: CYSTOSCOPY WITH BIOPSY;  Surgeon: Vanna Scotland, MD;  Location: [facility];  Service: Urology;  Laterality: N/A;   EYE SURGERY     FEMORAL-POPLITEAL BYPASS GRAFT Left 04/29/2023   Procedure: BYPASS GRAFT FEMORAL-POPLITEAL ARTERY;  Surgeon: Renford Dills, MD;  Location: [facility];  Service: Vascular;  Laterality: Left;   HERNIA REPAIR  10-31-14   ventral, retro-rectus atrium mesh   INCISION AND DRAINAGE ABSCESS Left 06/24/2023   Procedure: INCISION AND DRAINAGE WITH SATORIUS FLAP;  Surgeon: Renford Dills, MD;  Location: [facility];  Service: Vascular;  Laterality: Left;   IRRIGATION AND DEBRIDEMENT FOOT Left 01/18/2019   Procedure: IRRIGATION AND DEBRIDEMENT FOOT;  Surgeon: Linus Galas, DPM;  Location: [facility];  Service: Podiatry;  Laterality: Left;   LOWER EXTREMITY ANGIOGRAPHY Left 12/10/2016   Procedure: Lower Extremity Angiography;  Surgeon: Renford Dills, MD;  Location: [facility];  Service:  Cardiovascular;  Laterality: Left;   LOWER EXTREMITY ANGIOGRAPHY Left 02/02/2018   Procedure: LOWER EXTREMITY ANGIOGRAPHY;  Surgeon: Renford Dills, MD;  Location: [facility];  Service: Cardiovascular;  Laterality: Left;   LOWER EXTREMITY ANGIOGRAPHY Left 05/05/2018   Procedure: LOWER EXTREMITY ANGIOGRAPHY;  Surgeon: Renford Dills, MD;  Location: [facility];  Service: Cardiovascular;  Laterality: Left;   LOWER EXTREMITY ANGIOGRAPHY Left 12/04/2020   Procedure: LOWER EXTREMITY ANGIOGRAPHY with Intervention;  Surgeon: Renford Dills, MD;  Location: [facility];  Service: Cardiovascular;  Laterality: Left;   LOWER EXTREMITY ANGIOGRAPHY Left 04/24/2023   Procedure: Lower Extremity Angiography;  Surgeon: Renford Dills, MD;  Location: [facility];  Service: Cardiovascular;  Laterality: Left;   LOWER EXTREMITY ANGIOGRAPHY Left 05/01/2023   Procedure: Lower Extremity Angiography;  Surgeon: Renford Dills, MD;  Location: [facility];  Service: Cardiovascular;  Laterality: Left;   NEPHRECTOMY Left 10-31-14   PERIPHERAL VASCULAR CATHETERIZATION Left 05/01/2015   Procedure: Lower Extremity Angiography;  Surgeon: Renford Dills, MD;  Location: [facility];  Service: Cardiovascular;  Laterality: Left;   PERIPHERAL VASCULAR CATHETERIZATION  05/01/2015   Procedure: Lower Extremity Intervention;  Surgeon: Renford Dills, MD;  Location: [facility];  Service: Cardiovascular;;   PERIPHERAL VASCULAR CATHETERIZATION Left 02/20/2015   Procedure: Pelvic Angiography;  Surgeon: Renford Dills, MD;  Location: [facility];  Service: Cardiovascular;  Laterality: Left;   TRANSMETATARSAL AMPUTATION Left 05/05/2023   Procedure: TRANSMETATARSAL AMPUTATION LEFT;  Surgeon: Gwyneth Revels, DPM;  Location: [facility];  Service: Orthopedics/Podiatry;  Laterality: Left;   TRANSURETHRAL RESECTION OF BLADDER TUMOR WITH MITOMYCIN-C N/A  09/01/2016   Procedure: TRANSURETHRAL RESECTION OF BLADDER TUMOR WITH MITOMYCIN-C;  Surgeon: Vanna Scotland, MD;  Location: [facility];  Service: Urology;  Laterality: N/A;    Social History Social History   Tobacco Use   Smoking status: Former    Current packs/day: 0.00    Average packs/day: 2.0 packs/day for 35.0 years (70.0 ttl pk-yrs)    Types: Cigarettes    Start date: 03/29/1978    Quit date: 03/29/2013    Years since quitting: 10.3   Smokeless tobacco: Never  Vaping Use   Vaping status: Former  Substance Use Topics   Alcohol use: Not Currently    Alcohol/week: 0.0 standard drinks of alcohol    Comment: LAST DRINK 2009   Drug use: Not Currently    Types: Cocaine    Comment: last used in 2007    Family History Family History  Problem Relation Age of Onset   Cancer Mother 55       Lung Cancer   Cancer Father 64       Lung Ca   Diabetes Son    Breast cancer Maternal Grandmother    Kidney cancer Neg Hx    Bladder Cancer Neg Hx    Prostate cancer Neg Hx     No Known Allergies   REVIEW OF SYSTEMS (Negative unless checked)  Constitutional: [] Weight loss  [] Fever  [] Chills Cardiac: [] Chest pain   [] Chest pressure   [] Palpitations   [] Shortness of breath when laying flat   [] Shortness of breath with exertion. Vascular:  [x] Pain in legs with walking   [] Pain in legs at rest  [] History of DVT   [] Phlebitis   [] Swelling in legs   [] Varicose veins   [] Non-healing ulcers Pulmonary:   [] Uses home oxygen   [] Productive cough   [] Hemoptysis   [] Wheeze  [] COPD   [] Asthma Neurologic:  [] Dizziness   [] Seizures   [] History of stroke   [] History of TIA  [] Aphasia   [] Vissual changes   [] Weakness or numbness in arm   [] Weakness or numbness in leg Musculoskeletal:   [] Joint swelling   [] Joint pain   [] Low back pain Hematologic:  [] Easy bruising  [] Easy bleeding   [] Hypercoagulable state   [] Anemic Gastrointestinal:  [] Diarrhea   [] Vomiting  [] Gastroesophageal reflux/heartburn    [] Difficulty swallowing. Genitourinary:  [] Chronic kidney disease   [] Difficult urination  [] Frequent urination   [] Blood in urine Skin:  [] Rashes   [] Ulcers  Psychological:  [] History of anxiety   []  History of major depression.  Physical Examination  Vitals:   07/30/23 1013  BP: (!) 146/59  Pulse: 65  Resp: 16   There is no height or weight on file to calculate BMI. Gen: WD/WN, NAD Head: [place]/AT, No temporalis wasting.  Ear/Nose/Throat: Hearing grossly intact, nares w/o erythema or drainage Eyes: PER, EOMI, sclera nonicteric.  Neck: Supple, no masses.  No bruit or JVD.  Pulmonary:  Good air movement, no audible wheezing, no use of accessory muscles.  Cardiac: RRR, normal S1, S2, no Murmurs. Vascular:  mild trophic changes, no open wounds Vessel Right Left  Radial Palpable Palpable  Gastrointestinal: soft, non-distended. No guarding/no peritoneal signs.  Musculoskeletal: M/S 5/5 throughout.  No visible deformity.  Neurologic: CN 2-12 intact. Pain and light touch intact in extremities.  Symmetrical.  Speech is fluent. Motor exam as listed above. Psychiatric: Judgment intact, Mood & affect appropriate for pt's clinical situation. Dermatologic: No rashes or ulcers noted.  No changes consistent with cellulitis.   CBC Lab Results  Component Value Date   WBC 10.3 07/24/2023   HGB 9.8 (L) 07/24/2023   HCT  30.2 (L) 07/24/2023   MCV 88.0 07/24/2023   PLT 380 07/24/2023    BMET    Component Value Date/Time   NA 136 07/24/2023 0505   NA 136 08/02/2021 1629   NA 135 11/02/2014 0609   K 3.4 (L) 07/24/2023 0505   K 4.2 11/02/2014 0609   CL 103 07/24/2023 0505   CL 107 11/02/2014 0609   CO2 24 07/24/2023 0505   CO2 23 11/02/2014 0609   GLUCOSE 293 (H) 07/24/2023 0505   GLUCOSE 108 (H) 11/02/2014 0609   BUN 25 (H) 07/24/2023 0505   BUN 25 08/02/2021 1629   BUN 20 11/02/2014 0609   CREATININE 0.79 07/24/2023 0505   CREATININE 1.01 11/09/2015 1549   CREATININE 1.01 11/09/2015  1549   CALCIUM 8.1 (L) 07/24/2023 0505   CALCIUM 7.3 (L) 11/02/2014 0609   GFRNONAA >60 07/24/2023 0505   GFRNONAA 50 (L) 11/02/2014 0609   GFRAA >60 01/19/2019 0357   GFRAA 58 (L) 11/02/2014 0609   Estimated Creatinine Clearance: 75.8 mL/min (by C-G formula based on SCr of 0.79 mg/dL).  COAG Lab Results  Component Value Date   INR 1.1 06/21/2023   INR 0.9 10/17/2014   INR 1.1 01/19/2014    Radiology No results found.   Assessment/Plan 1. Vascular graft infection, sequela (Primary) VAC dressing is removed the wound is very well granulated and very clean.  It measures 13 cm by 3 cm for 39 cm2  VAC dressing was replaced   She is improving as I would hope.  2. Atherosclerosis of native arteries of the extremities with ulceration (HCC) Recommend:  The patient is status post successful angiogram with intervention.  The patient reports that the claudication symptoms and leg pain has improved.   Her rest pain is resolved.  The patient denies lifestyle limiting changes at this point in time.  No further invasive studies, angiography or surgery at this time. The patient should continue walking and begin a more formal exercise program.  The patient should continue antiplatelet therapy and aggressive treatment of the lipid abnormalities  Continued surveillance is indicated as atherosclerosis is likely to progress with time.    Patient should undergo noninvasive studies as ordered. The patient will follow up with me to review the studies.   3. Controlled type 2 DM with peripheral circulatory disorder (HCC) Continue hypoglycemic medications as already ordered, these medications have been reviewed and there are no changes at this time.  Hgb A1C to be monitored as already arranged by primary service  4. Primary hypertension Continue antihypertensive medications as already ordered, these medications have been reviewed and there are no changes at this time.    [name],  MD  07/30/2023 12:33 PM

## 2023-07-31 ENCOUNTER — Encounter: Payer: Self-pay | Admitting: Vascular Surgery

## 2023-07-31 DIAGNOSIS — I2510 Atherosclerotic heart disease of native coronary artery without angina pectoris: Secondary | ICD-10-CM | POA: Diagnosis not present

## 2023-07-31 DIAGNOSIS — I5032 Chronic diastolic (congestive) heart failure: Secondary | ICD-10-CM | POA: Diagnosis not present

## 2023-07-31 DIAGNOSIS — T8149XA Infection following a procedure, other surgical site, initial encounter: Secondary | ICD-10-CM | POA: Diagnosis not present

## 2023-07-31 DIAGNOSIS — Z8551 Personal history of malignant neoplasm of bladder: Secondary | ICD-10-CM | POA: Diagnosis not present

## 2023-07-31 DIAGNOSIS — I6529 Occlusion and stenosis of unspecified carotid artery: Secondary | ICD-10-CM | POA: Diagnosis not present

## 2023-07-31 DIAGNOSIS — Z905 Acquired absence of kidney: Secondary | ICD-10-CM | POA: Diagnosis not present

## 2023-07-31 DIAGNOSIS — Z85528 Personal history of other malignant neoplasm of kidney: Secondary | ICD-10-CM | POA: Diagnosis not present

## 2023-07-31 DIAGNOSIS — I110 Hypertensive heart disease with heart failure: Secondary | ICD-10-CM | POA: Diagnosis not present

## 2023-07-31 DIAGNOSIS — Z794 Long term (current) use of insulin: Secondary | ICD-10-CM | POA: Diagnosis not present

## 2023-07-31 DIAGNOSIS — Z89422 Acquired absence of other left toe(s): Secondary | ICD-10-CM | POA: Diagnosis not present

## 2023-07-31 DIAGNOSIS — D649 Anemia, unspecified: Secondary | ICD-10-CM | POA: Diagnosis not present

## 2023-07-31 DIAGNOSIS — Z87891 Personal history of nicotine dependence: Secondary | ICD-10-CM | POA: Diagnosis not present

## 2023-07-31 DIAGNOSIS — Z9181 History of falling: Secondary | ICD-10-CM | POA: Diagnosis not present

## 2023-07-31 DIAGNOSIS — E1169 Type 2 diabetes mellitus with other specified complication: Secondary | ICD-10-CM | POA: Diagnosis not present

## 2023-07-31 DIAGNOSIS — M79605 Pain in left leg: Secondary | ICD-10-CM | POA: Diagnosis not present

## 2023-07-31 DIAGNOSIS — E1151 Type 2 diabetes mellitus with diabetic peripheral angiopathy without gangrene: Secondary | ICD-10-CM | POA: Diagnosis not present

## 2023-07-31 DIAGNOSIS — E871 Hypo-osmolality and hyponatremia: Secondary | ICD-10-CM | POA: Diagnosis not present

## 2023-07-31 DIAGNOSIS — L02214 Cutaneous abscess of groin: Secondary | ICD-10-CM | POA: Diagnosis not present

## 2023-07-31 DIAGNOSIS — E785 Hyperlipidemia, unspecified: Secondary | ICD-10-CM | POA: Diagnosis not present

## 2023-07-31 DIAGNOSIS — R197 Diarrhea, unspecified: Secondary | ICD-10-CM | POA: Diagnosis not present

## 2023-07-31 DIAGNOSIS — E1142 Type 2 diabetes mellitus with diabetic polyneuropathy: Secondary | ICD-10-CM | POA: Diagnosis not present

## 2023-07-31 DIAGNOSIS — Z7982 Long term (current) use of aspirin: Secondary | ICD-10-CM | POA: Diagnosis not present

## 2023-07-31 DIAGNOSIS — D72829 Elevated white blood cell count, unspecified: Secondary | ICD-10-CM | POA: Diagnosis not present

## 2023-08-03 ENCOUNTER — Ambulatory Visit (INDEPENDENT_AMBULATORY_CARE_PROVIDER_SITE_OTHER): Payer: 59 | Admitting: Vascular Surgery

## 2023-08-03 ENCOUNTER — Encounter (INDEPENDENT_AMBULATORY_CARE_PROVIDER_SITE_OTHER): Payer: Self-pay | Admitting: Vascular Surgery

## 2023-08-03 VITALS — BP 158/78 | HR 79 | Resp 18 | Ht 67.0 in | Wt 137.0 lb

## 2023-08-03 DIAGNOSIS — T827XXS Infection and inflammatory reaction due to other cardiac and vascular devices, implants and grafts, sequela: Secondary | ICD-10-CM

## 2023-08-03 DIAGNOSIS — T827XXD Infection and inflammatory reaction due to other cardiac and vascular devices, implants and grafts, subsequent encounter: Secondary | ICD-10-CM | POA: Diagnosis not present

## 2023-08-04 ENCOUNTER — Encounter (INDEPENDENT_AMBULATORY_CARE_PROVIDER_SITE_OTHER): Payer: Self-pay | Admitting: Vascular Surgery

## 2023-08-04 NOTE — Progress Notes (Signed)
    OPERATIVE NOTE   PROCEDURE: 1.  VAC dressing change  PRE-OPERATIVE DIAGNOSIS: Vascular graft infection with left groin wound dehiscence  POST-OPERATIVE DIAGNOSIS: Same  SURGEON: [name]  ASSISTANT(S): None  ANESTHESIA: none  ESTIMATED BLOOD LOSS: 0 cc  FINDING(S): Clean granulating bed  SPECIMEN(S): None  INDICATIONS:   Laura Reed is a 63 y.o. female who presents for a wound VAC dressing change.  DESCRIPTION: After full informed written consent was obtained from the patient, the patient was brought back to the operating room and placed supine upon the operating table.  Prior to induction, the patient received IV antibiotics.   After obtaining adequate anesthesia, the patient was then prepped and draped in the standard fashion for a VAC dressing drainage.  Previous VAC is removed.  The Adaptic is removed.  The bed is clean there is no drainage.  Wound is measured and it is 12 cm x 3 cm for a total of 36 cm.  A piece of Adaptic is laid on the bed of the wound a black VAC sponges then trimmed to the appropriate size and covered.  Seal is in the green in the office.   The patient tolerated this procedure well.   COMPLICATIONS: None  CONDITION: [name] [facility]  Office: [phone]   08/04/2023, 12:30 PM

## 2023-08-05 DIAGNOSIS — Z8551 Personal history of malignant neoplasm of bladder: Secondary | ICD-10-CM | POA: Diagnosis not present

## 2023-08-05 DIAGNOSIS — Z89422 Acquired absence of other left toe(s): Secondary | ICD-10-CM | POA: Diagnosis not present

## 2023-08-05 DIAGNOSIS — I5032 Chronic diastolic (congestive) heart failure: Secondary | ICD-10-CM | POA: Diagnosis not present

## 2023-08-05 DIAGNOSIS — Z7982 Long term (current) use of aspirin: Secondary | ICD-10-CM | POA: Diagnosis not present

## 2023-08-05 DIAGNOSIS — R197 Diarrhea, unspecified: Secondary | ICD-10-CM | POA: Diagnosis not present

## 2023-08-05 DIAGNOSIS — I110 Hypertensive heart disease with heart failure: Secondary | ICD-10-CM | POA: Diagnosis not present

## 2023-08-05 DIAGNOSIS — E871 Hypo-osmolality and hyponatremia: Secondary | ICD-10-CM | POA: Diagnosis not present

## 2023-08-05 DIAGNOSIS — T8149XA Infection following a procedure, other surgical site, initial encounter: Secondary | ICD-10-CM | POA: Diagnosis not present

## 2023-08-05 DIAGNOSIS — Z9181 History of falling: Secondary | ICD-10-CM | POA: Diagnosis not present

## 2023-08-05 DIAGNOSIS — I6529 Occlusion and stenosis of unspecified carotid artery: Secondary | ICD-10-CM | POA: Diagnosis not present

## 2023-08-05 DIAGNOSIS — Z85528 Personal history of other malignant neoplasm of kidney: Secondary | ICD-10-CM | POA: Diagnosis not present

## 2023-08-05 DIAGNOSIS — D649 Anemia, unspecified: Secondary | ICD-10-CM | POA: Diagnosis not present

## 2023-08-05 DIAGNOSIS — Z87891 Personal history of nicotine dependence: Secondary | ICD-10-CM | POA: Diagnosis not present

## 2023-08-05 DIAGNOSIS — D72829 Elevated white blood cell count, unspecified: Secondary | ICD-10-CM | POA: Diagnosis not present

## 2023-08-05 DIAGNOSIS — Z905 Acquired absence of kidney: Secondary | ICD-10-CM | POA: Diagnosis not present

## 2023-08-05 DIAGNOSIS — E785 Hyperlipidemia, unspecified: Secondary | ICD-10-CM | POA: Diagnosis not present

## 2023-08-05 DIAGNOSIS — E1142 Type 2 diabetes mellitus with diabetic polyneuropathy: Secondary | ICD-10-CM | POA: Diagnosis not present

## 2023-08-05 DIAGNOSIS — Z794 Long term (current) use of insulin: Secondary | ICD-10-CM | POA: Diagnosis not present

## 2023-08-05 DIAGNOSIS — I2510 Atherosclerotic heart disease of native coronary artery without angina pectoris: Secondary | ICD-10-CM | POA: Diagnosis not present

## 2023-08-05 DIAGNOSIS — L02214 Cutaneous abscess of groin: Secondary | ICD-10-CM | POA: Diagnosis not present

## 2023-08-05 DIAGNOSIS — E1169 Type 2 diabetes mellitus with other specified complication: Secondary | ICD-10-CM | POA: Diagnosis not present

## 2023-08-05 DIAGNOSIS — M79605 Pain in left leg: Secondary | ICD-10-CM | POA: Diagnosis not present

## 2023-08-05 DIAGNOSIS — E1151 Type 2 diabetes mellitus with diabetic peripheral angiopathy without gangrene: Secondary | ICD-10-CM | POA: Diagnosis not present

## 2023-08-06 ENCOUNTER — Ambulatory Visit: Payer: 59 | Admitting: Internal Medicine

## 2023-08-06 ENCOUNTER — Encounter (INDEPENDENT_AMBULATORY_CARE_PROVIDER_SITE_OTHER): Payer: Self-pay

## 2023-08-06 ENCOUNTER — Ambulatory Visit (INDEPENDENT_AMBULATORY_CARE_PROVIDER_SITE_OTHER): Payer: 59 | Admitting: Vascular Surgery

## 2023-08-06 ENCOUNTER — Encounter: Payer: Self-pay | Admitting: Internal Medicine

## 2023-08-06 VITALS — BP 110/54 | HR 76 | Ht 67.0 in | Wt 135.2 lb

## 2023-08-06 VITALS — BP 116/66 | HR 78 | Resp 16 | Wt 139.0 lb

## 2023-08-06 DIAGNOSIS — Z794 Long term (current) use of insulin: Secondary | ICD-10-CM | POA: Diagnosis not present

## 2023-08-06 DIAGNOSIS — D500 Iron deficiency anemia secondary to blood loss (chronic): Secondary | ICD-10-CM | POA: Diagnosis not present

## 2023-08-06 DIAGNOSIS — E1142 Type 2 diabetes mellitus with diabetic polyneuropathy: Secondary | ICD-10-CM

## 2023-08-06 DIAGNOSIS — N179 Acute kidney failure, unspecified: Secondary | ICD-10-CM | POA: Diagnosis not present

## 2023-08-06 DIAGNOSIS — E1165 Type 2 diabetes mellitus with hyperglycemia: Secondary | ICD-10-CM

## 2023-08-06 DIAGNOSIS — E871 Hypo-osmolality and hyponatremia: Secondary | ICD-10-CM | POA: Diagnosis not present

## 2023-08-06 DIAGNOSIS — L02214 Cutaneous abscess of groin: Secondary | ICD-10-CM | POA: Diagnosis not present

## 2023-08-06 DIAGNOSIS — T827XXS Infection and inflammatory reaction due to other cardiac and vascular devices, implants and grafts, sequela: Secondary | ICD-10-CM

## 2023-08-06 DIAGNOSIS — T827XXD Infection and inflammatory reaction due to other cardiac and vascular devices, implants and grafts, subsequent encounter: Secondary | ICD-10-CM

## 2023-08-06 DIAGNOSIS — I7025 Atherosclerosis of native arteries of other extremities with ulceration: Secondary | ICD-10-CM | POA: Diagnosis not present

## 2023-08-06 DIAGNOSIS — D72823 Leukemoid reaction: Secondary | ICD-10-CM | POA: Diagnosis not present

## 2023-08-06 LAB — POCT GLYCOSYLATED HEMOGLOBIN (HGB A1C): Hemoglobin A1C: 6.9 % — AB (ref 4.0–5.6)

## 2023-08-06 NOTE — Patient Instructions (Addendum)
I'm glad you are feeling better  Let's Increase the tresiba to 18 units starting today   Check sugars pre meal    3 times daily and use the same sliding scale for now.  WE will review post  prandial readings weekly and adjust the slidig scale and the  tresiba  based on what we see   WE WILL REPEAT OTHER LABS INCLUDING [facility] IN 2 WEEKS   BRING SENSOR IN ON MONDAY FOR DOWNLOAD  AND AGAIN IN 2 WEEKS   HEALTHY CHOICE MAKES FUDGSICLES AND BROWNIES THAT ARE LOW IN CARBS AND BOTH ARE AVAILABLE AT [facility]

## 2023-08-06 NOTE — Progress Notes (Signed)
Subjective:  Patient ID: Laura Reed, female    DOB: 1961/03/05  Age: 63 y.o. MRN: [id number]  CC: The primary encounter diagnosis was Leukemoid reaction. Diagnoses of Hyponatremia, Iron deficiency anemia due to chronic blood loss, Type 2 diabetes mellitus with hyperglycemia, with long-term current use of insulin (HCC), Abscess of groin, left, AKI (acute kidney injury) (HCC), Atherosclerosis of native arteries of the extremities with ulceration (HCC), Diabetic peripheral neuropathy associated with type 2 diabetes mellitus (HCC), and Vascular graft infection, sequela were also pertinent to this visit.   HPI Laura Reed presents for  Chief Complaint  Patient presents with   Hospitalization Follow-up   HOSPITAL FOLLOW UP.   Laura Reed has had a LONG PROTRACTED HISTORY SINCE LAST VISIT :  In October , she underwent transmetatarsal left lower extremity amputation following a Percutaneous transluminal angioplasty and stent placement l in the left common femoral artery, left anterior tibial artery with Mechanical thrombectomy of the distal common femoral artery and femoral distal bypass graft. Her anticoagulation has been changed from plavix/asa to Eliquis/ASA  She  presented to [facility] urgent care on 06/21/23 with complaints of right groin pain, redness and oozing post op and was sent to ED for  presumed sepsis given her presentation with  hypotension.  Patient was initially admitted to hospitalist service, later transferred to ICU for pressor support.   She was diagnosed with left groin abscess  and and went serial debridements in the OR and  wound VAC exchange.  Patient underwent wound VAC exchanges 9 times, with last one on 07/23/23.  During her hospitalization, --ID cwas onsulted.  Pt received IV abx for 4 weeks, mostly with Unasyn until 07/22/23, and then transitioned to augmentin for 2 weeks until 08/06/23.    She was discharged with no HH agency willing to provide wound vac  care/dressing changes,   so she has been going to [facility]  to have her  wound vac dressing changed twice weekly .  Marland Kitchen  During hospitalization , she has recurrent  hypoglycemia  Home Evaristo Bury was reduced from 22u to 15u daily. Pt was discharged on Lispro 5-10 units with meals TID and a generalized sliding scal  5 units if blood sugar <200, 10 units if blood sugar >200.taking insulin   Blood sugars have been averaging 150  but occasionally up to 300 .  Did not bring the sensor for her CBG monitor   Anemia:  she is No longer taking iron due to effect on stools.  Hgb improving   She is accompanied by her sister today  who has been supporting her through her recovery.  She feels generally well  appetite Is improving,  denies pain.    Outpatient Medications Prior to Visit  Medication Sig Dispense Refill   acetaminophen (TYLENOL) 500 MG tablet Take 1,000 mg by mouth every 6 (six) hours as needed for mild pain or headache.      acidophilus (RISAQUAD) CAPS capsule Take 2 capsules by mouth 2 (two) times daily.     apixaban (ELIQUIS) 2.5 MG TABS tablet Take 1 tablet (2.5 mg total) by mouth 2 (two) times daily. 60 tablet 11   aspirin 81 MG chewable tablet Chew 81 mg by mouth at bedtime.      atorvastatin (LIPITOR) 20 MG tablet TAKE 1 TABLET(20 MG) BY MOUTH DAILY 90 tablet 3   Cholecalciferol (VITAMIN D3) 2000 UNITS TABS Take 2,000 Units by mouth daily after supper.      Continuous Glucose Sensor (FREESTYLE  LIBRE 2 SENSOR) MISC Use to check sugar at least 4 times daily 2 each 2   empagliflozin (JARDIANCE) 25 MG TABS tablet TAKE 1 TABLET(25 MG) BY MOUTH DAILY BEFORE AND BREAKFAST 90 tablet 2   gabapentin (NEURONTIN) 300 MG capsule Take 300 mg by mouth 2 (two) times daily.     glucose blood test strip Use you check blood sugars up to four times daily. 100 each 12   insulin degludec (TRESIBA FLEXTOUCH) 100 UNIT/ML FlexTouch Pen Inject 15 Units into the skin daily. Reduced from 22 units daily.     insulin lispro (HUMALOG KWIKPEN) 100  UNIT/ML KwikPen ADMINISTER 5-10 UNITS UNDER THE SKIN THREE TIMES DAILY if you eat a meal.  5 units if blood sugar <200, 10 units if blood sugar >200. 15 mL 1   Insulin Pen Needle (PEN NEEDLES) 32G X 4 MM MISC Use to take insulin daily 100 each 3   Multiple Vitamin (MULTIVITAMIN) tablet Take 1 tablet by mouth daily.     polycarbophil (FIBERCON) 625 MG tablet Take 1 tablet (625 mg total) by mouth 2 (two) times daily. For stool bulking. 60 tablet 0   triamcinolone ointment (KENALOG) 0.5 % Apply 1 Application topically 2 (two) times daily. 80 g 0   amoxicillin-clavulanate (AUGMENTIN) 875-125 MG tablet Take 1 tablet by mouth every 12 (twelve) hours for 14 days. 28 tablet 0   Fe Fum-Vit C-Vit B12-FA (TRIGELS-F FORTE) CAPS capsule Take 1 capsule by mouth 2 (two) times daily. (Patient not taking: Reported on 08/06/2023) 60 capsule 2   losartan (COZAAR) 50 MG tablet Take 50 mg by mouth daily. (Patient not taking: Reported on 08/06/2023)     oxyCODONE-acetaminophen (PERCOCET/ROXICET) 5-325 MG tablet Take 1 tablet by mouth daily as needed (with wound vac exchanges). (Patient not taking: Reported on 08/06/2023) 15 tablet 0   No facility-administered medications prior to visit.    Review of Systems;  Patient denies headache, fevers, malaise, unintentional weight loss, skin rash, eye pain, sinus congestion and sinus pain, sore throat, dysphagia,  hemoptysis , cough, dyspnea, wheezing, chest pain, palpitations, orthopnea, edema, abdominal pain, nausea, melena, diarrhea, constipation, flank pain, dysuria, hematuria, urinary  Frequency, nocturia, numbness, tingling, seizures,  Focal weakness, Loss of consciousness,  Tremor, insomnia, depression, anxiety, and suicidal ideation.      Objective:  BP (!) 110/54   Pulse 76   Ht 5\' 7"  (1.702 m)   Wt 135 lb 3.2 oz (61.3 kg)   SpO2 99%   BMI 21.18 kg/m   BP Readings from Last 3 Encounters:  08/06/23 116/66  08/06/23 (!) 110/54  08/03/23 (!) 158/78    Wt  Readings from Last 3 Encounters:  08/06/23 139 lb (63 kg)  08/06/23 135 lb 3.2 oz (61.3 kg)  08/03/23 137 lb (62.1 kg)    Physical Exam Vitals reviewed.  Constitutional:      General: She is not in acute distress.    Appearance: Normal appearance. She is underweight. She is toxic-appearing. She is not ill-appearing or diaphoretic.     Comments: Looks  10 yrs older than stated years due to weight loss    HENT:     Head: Normocephalic.  Eyes:     General: No scleral icterus.       Right eye: No discharge.        Left eye: No discharge.     Conjunctiva/sclera: Conjunctivae normal.  Cardiovascular:     Rate and Rhythm: Normal rate and regular rhythm.  Heart sounds: Normal heart sounds.  Pulmonary:     Effort: Pulmonary effort is normal. No respiratory distress.     Breath sounds: Normal breath sounds.  Musculoskeletal:        General: Normal range of motion.  Skin:    General: Skin is warm and dry.  Neurological:     General: No focal deficit present.     Mental Status: She is alert and oriented to person, place, and time. Mental status is at baseline.  Psychiatric:        Mood and Affect: Mood normal.        Behavior: Behavior normal.        Thought Content: Thought content normal.        Judgment: Judgment normal.    Lab Results  Component Value Date   HGBA1C 6.9 (A) 08/06/2023   HGBA1C 5.8 05/19/2023   HGBA1C 8.0 (H) 01/27/2023    Lab Results  Component Value Date   CREATININE 0.79 07/24/2023   CREATININE 0.87 07/20/2023   CREATININE 0.73 07/17/2023    Lab Results  Component Value Date   WBC 10.3 07/24/2023   HGB 9.8 (L) 07/24/2023   HCT 30.2 (L) 07/24/2023   PLT 380 07/24/2023   GLUCOSE 293 (H) 07/24/2023   CHOL 85 01/27/2023   TRIG 65.0 01/27/2023   HDL 41.80 01/27/2023   LDLDIRECT 36.0 01/27/2023   LDLCALC 30 01/27/2023   ALT 14 06/24/2023   AST 16 06/24/2023   NA 136 07/24/2023   K 3.4 (L) 07/24/2023   CL 103 07/24/2023   CREATININE 0.79  07/24/2023   BUN 25 (H) 07/24/2023   CO2 24 07/24/2023   TSH 0.864 06/22/2023   INR 1.1 06/21/2023   HGBA1C 6.9 (A) 08/06/2023   MICROALBUR 5.6 (H) 09/08/2022    ECHOCARDIOGRAM COMPLETE Result Date: 06/25/2023    ECHOCARDIOGRAM REPORT   Patient Name:   LIYA STROLLO Date of Exam: 06/24/2023 Medical Rec #:  [id number]       Height:       67.0 in Accession #:    [id number]      Weight:       141.4 lb Date of Birth:  11/16/1960       BSA:          1.745 m Patient Age:    62 years        BP:           107/74 mmHg Patient Gender: F               HR:           65 bpm. Exam Location:  [facility] Procedure: 2D Echo, Cardiac Doppler and Color Doppler Indications:     I48.91 Atrial Fibrillation.  History:         Patient has no prior history of Echocardiogram examinations.                  CHF, CAD, Signs/Symptoms:Murmur; Risk Factors:Hypertension and                  Diabetes.  Sonographer:     Daphine Deutscher RDCS Referring Phys:  [id number] Latina Craver [name] Diagnosing Phys: Julien Nordmann MD IMPRESSIONS  1. Left ventricular ejection fraction, by estimation, is 35 to 40%. The left ventricle has moderately decreased function. The left ventricle demonstrates global hypokinesis. The left ventricular internal cavity size was mildly dilated. Left ventricular diastolic parameters are indeterminate.  2. Right ventricular systolic  function is normal. The right ventricular size is normal. There is mildly elevated pulmonary artery systolic pressure. The estimated right ventricular systolic pressure is 42.5 mmHg.  3. The mitral valve is normal in structure. Mild to moderate mitral valve regurgitation. No evidence of mitral stenosis.  4. Tricuspid valve regurgitation is mild to moderate.  5. The aortic valve is calcified. There is moderate calcification of the aortic valve. Aortic valve regurgitation is not visualized. Aortic valve sclerosis/calcification is present, without any evidence of aortic stenosis.  6. The inferior  vena cava is dilated in size with >50% respiratory variability, suggesting right atrial pressure of 8 mmHg. FINDINGS  Left Ventricle: Left ventricular ejection fraction, by estimation, is 35 to 40%. The left ventricle has moderately decreased function. The left ventricle demonstrates global hypokinesis. The left ventricular internal cavity size was mildly dilated. There is no left ventricular hypertrophy. Left ventricular diastolic parameters are indeterminate. Right Ventricle: The right ventricular size is normal. No increase in right ventricular wall thickness. Right ventricular systolic function is normal. There is mildly elevated pulmonary artery systolic pressure. The tricuspid regurgitant velocity is 2.85  m/s, and with an assumed right atrial pressure of 10 mmHg, the estimated right ventricular systolic pressure is 42.5 mmHg. Left Atrium: Left atrial size was normal in size. Right Atrium: Right atrial size was normal in size. Pericardium: There is no evidence of pericardial effusion. Mitral Valve: The mitral valve is normal in structure. Mild to moderate mitral valve regurgitation. No evidence of mitral valve stenosis. Tricuspid Valve: The tricuspid valve is normal in structure. Tricuspid valve regurgitation is mild to moderate. No evidence of tricuspid stenosis. Aortic Valve: The aortic valve is calcified. There is moderate calcification of the aortic valve. Aortic valve regurgitation is not visualized. Aortic valve sclerosis/calcification is present, without any evidence of aortic stenosis. Pulmonic Valve: The pulmonic valve was normal in structure. Pulmonic valve regurgitation is not visualized. No evidence of pulmonic stenosis. Aorta: The aortic root is normal in size and structure. Venous: The inferior vena cava is dilated in size with greater than 50% respiratory variability, suggesting right atrial pressure of 8 mmHg. IAS/Shunts: No atrial level shunt detected by color flow Doppler.  LEFT VENTRICLE  PLAX 2D LVIDd:         5.90 cm   Diastology LVIDs:         4.60 cm   LV e' medial:    5.82 cm/s LV PW:         0.80 cm   LV E/e' medial:  25.1 LV IVS:        0.50 cm   LV e' lateral:   7.51 cm/s LVOT diam:     1.80 cm   LV E/e' lateral: 19.4 LV SV:         50 LV SV Index:   29 LVOT Area:     2.54 cm  RIGHT VENTRICLE            IVC RV Basal diam:  4.20 cm    IVC diam: 2.20 cm RV S prime:     8.98 cm/s TAPSE (M-mode): 2.4 cm LEFT ATRIUM             Index        RIGHT ATRIUM           Index LA diam:        4.10 cm 2.35 cm/m   RA Area:     13.20 cm LA Vol (A2C):   68.0 ml  38.96 ml/m  RA Volume:   29.20 ml  16.73 ml/m LA Vol (A4C):   47.6 ml 27.28 ml/m LA Biplane Vol: 58.8 ml 33.69 ml/m  AORTIC VALVE LVOT Vmax:   83.15 cm/s LVOT Vmean:  55.750 cm/s LVOT VTI:    0.196 m  AORTA Ao Root diam: 2.50 cm MITRAL VALVE                TRICUSPID VALVE MV Area (PHT): 3.68 cm     TR Peak grad:   32.5 mmHg MV Decel Time: 206 msec     TR Vmax:        285.00 cm/s MV E velocity: 146.00 cm/s MV A velocity: 79.00 cm/s   SHUNTS MV E/A ratio:  1.85         Systemic VTI:  0.20 m                             Systemic Diam: 1.80 cm Julien Nordmann MD Electronically signed by Julien Nordmann MD Signature Date/Time: 06/25/2023/5:07:32 PM    Final     Assessment & Plan:  .Leukemoid reaction  Hyponatremia -     Comprehensive metabolic panel; Future  Iron deficiency anemia due to chronic blood loss -     Iron, TIBC and Ferritin Panel; Future -     CBC with Differential/Platelet; Future  Type 2 diabetes mellitus with hyperglycemia, with long-term current use of insulin (HCC) -     POCT glycosylated hemoglobin (Hb A1C)  Abscess of groin, left Assessment & Plan: S/p debridement ,  prlonged IV abx and and wound vac    AKI (acute kidney injury) (HCC) Assessment & Plan: Secondary to septic shock.  Renal function has returned to baseline    Lab Results  Component Value Date   CREATININE 0.79 07/24/2023       Atherosclerosis of native arteries of the extremities with ulceration (HCC) Assessment & Plan: S/p PTCA and stent to Left CFA, L Anterior tibial artery and mechanial thrombectomy followed by transmetarsal amputation    Diabetic peripheral neuropathy associated with type 2 diabetes mellitus (HCC) Assessment & Plan: Loss of control in August  now resolved and Tresiba dose reduced during prlonged hospitalization go 15 untis daily . Based on recent BS reports I have Advised to increase [place] to 18 units and document pre and post meal cbgs so sliding scale can be amended  Lab Results  Component Value Date   HGBA1C 6.9 (A) 08/06/2023      Vascular graft infection, sequela Assessment & Plan: Pansensitivie E Coli and Enterococcus Faecalis by Oct 29 culture. Of wound drainage.  She required multiple debridements in the OR , prlonged IV abx,  and Wound Ac replacement.Marland Kitchen  wound is healing well per review of vascular surgery follow up       I provided 30 minutes of face-to-face time during this encounter reviewing patient's  last 2 hospitalizations,  recent surgical and non surgical procedures, previous  labs and imaging studies, counseling on currently addressed issues,  and post visit ordering to diagnostics and therapeutics .   Follow-up: Return in about 4 weeks (around 09/03/2023) for follow up diabetes.   Sherlene Shams, MD

## 2023-08-08 ENCOUNTER — Encounter (INDEPENDENT_AMBULATORY_CARE_PROVIDER_SITE_OTHER): Payer: Self-pay | Admitting: Vascular Surgery

## 2023-08-08 MED ORDER — AMOXICILLIN-POT CLAVULANATE 875-125 MG PO TABS: 1.0000 | ORAL_TABLET | Freq: Two times a day (BID) | ORAL | 6 refills | Status: AC

## 2023-08-08 NOTE — Assessment & Plan Note (Signed)
S/p debridement ,  prlonged IV abx and and wound vac

## 2023-08-08 NOTE — Assessment & Plan Note (Signed)
Loss of control in August  now resolved and Tresiba dose reduced during prlonged hospitalization go 15 untis daily . Based on recent BS reports I have Advised to increase [place] to 18 units and document pre and post meal cbgs so sliding scale can be amended  Lab Results  Component Value Date   HGBA1C 6.9 (A) 08/06/2023

## 2023-08-08 NOTE — Assessment & Plan Note (Signed)
Secondary to septic shock.  Renal function has returned to baseline    Lab Results  Component Value Date   CREATININE 0.79 07/24/2023

## 2023-08-08 NOTE — Assessment & Plan Note (Addendum)
Pansensitivie E Coli and Enterococcus Faecalis by Oct 29 culture. Of wound drainage.  She required multiple debridements in the OR , prlonged IV abx,  and Wound Ac replacement.Marland Kitchen  wound is healing well per review of vascular surgery follow up

## 2023-08-08 NOTE — Progress Notes (Signed)
Patient ID: Laura Reed, female   DOB: Jul 09, 1960, 63 y.o.   MRN: [id number]  Chief Complaint  Patient presents with   Follow-up    Wound vac change    HPI Laura Reed is a 63 y.o. female.    Patient returns for dressing change.  She denies any new symptoms no drainage the VAC is been intact she denies fever chills   Past Medical History:  Diagnosis Date   Absence of kidney    left   Anxiety    Arthritis    Atherosclerosis of native arteries of extremities with intermittent claudication, bilateral legs (HCC)    Bladder cancer (HCC)    CHF (congestive heart failure) (HCC)    Complication of anesthesia    BP HAS  RUN LOW AFTER SURGERY-LUNGS FILLED UP WITH FLUID AFTER  LEG STENT SURGERY    Coronary artery disease    Diabetes mellitus    Family history of adverse reaction to anesthesia    Sister - PONV   GERD (gastroesophageal reflux disease)    OCC TUMS   Heart murmur    Hemorrhoid    History of methicillin resistant staphylococcus aureus (MRSA) 2007   Hypertension    Neuropathy    PVD (peripheral vascular disease) (HCC)    Thyroid nodule    right   Unspecified osteoarthritis, unspecified site    Urothelial carcinoma of kidney (HCC) 10/31/2014   INVASIVE UROTHELIAL CARCINOMA, LOW GRADE. T1, Nx.   Vitamin D deficiency, unspecified    Wears dentures    full upper and lower    Past Surgical History:  Procedure Laterality Date   AMPUTATION TOE     right (4th and 5th); left (great toe, 3rd)   AMPUTATION TOE Right 07/16/2018   Procedure: AMPUTATION TOE/MPJ right 2nd;  Surgeon: Linus Galas, DPM;  Location: [facility];  Service: Podiatry;  Laterality: Right;   APPLICATION OF WOUND VAC Left 06/26/2023   Procedure: WOUND VAC EXCHANGE LEFT GROIN;  Surgeon: Renford Dills, MD;  Location: [facility];  Service: Vascular;  Laterality: Left;   APPLICATION OF WOUND VAC Left 06/29/2023   Procedure: WOUND VAC EXCHANGE LEFT GROIN;  Surgeon: Renford Dills, MD;   Location: [facility];  Service: Vascular;  Laterality: Left;   APPLICATION OF WOUND VAC Left 07/02/2023   Procedure: WOUND VAC EXCHANGE LEFT GROIN;  Surgeon: Annice Needy, MD;  Location: [facility];  Service: Vascular;  Laterality: Left;   APPLICATION OF WOUND VAC Left 07/06/2023   Procedure: WOUND VAC EXCHANGE LEFT GROIN;  Surgeon: Renford Dills, MD;  Location: [facility];  Service: Vascular;  Laterality: Left;   APPLICATION OF WOUND VAC Left 07/16/2023   Procedure: WOUND VAC EXCHANGE LEFT GROIN;  Surgeon: Renford Dills, MD;  Location: [facility];  Service: Vascular;  Laterality: Left;   APPLICATION OF WOUND VAC Left 07/20/2023   Procedure: WOUND VAC EXCHANGE;  Surgeon: Renford Dills, MD;  Location: [facility];  Service: Vascular;  Laterality: Left;   APPLICATION OF WOUND VAC Left 07/23/2023   Procedure: WOUND VAC EXCHANGE;  Surgeon: Renford Dills, MD;  Location: [facility];  Service: Vascular;  Laterality: Left;   APPLICATION OF WOUND VAC Left 07/13/2023   Procedure: WOUND VAC EXCHANGE LEFT GROIN;  Surgeon: Renford Dills, MD;  Location: [facility];  Service: Vascular;  Laterality: Left;   APPLICATION OF WOUND VAC Left 07/09/2023   Procedure: WOUND VAC EXCHANGE OF LEFT GROIN;  Surgeon: [name]  S, MD;  Location: [facility];  Service: Vascular;  Laterality: Left;   ARTERIAL BYPASS SURGRY  2009, 2013 x 2   right leg , done in [place]   CARDIAC CATHETERIZATION     CAROTID ENDARTERECTOMY Right 01/2014   Dr Gilda Crease   CATARACT EXTRACTION W/PHACO Right 12/14/2014   Procedure: CATARACT EXTRACTION PHACO AND INTRAOCULAR LENS PLACEMENT (IOC);  Surgeon: Lia Hopping, MD;  Location: [facility];  Service: Ophthalmology;  Laterality: Right;  [place]   00:38.6              AP        7.1                   CDE  2.76   CATARACT EXTRACTION W/PHACO Left 12/06/2019   Procedure: CATARACT EXTRACTION PHACO AND INTRAOCULAR LENS PLACEMENT (IOC) LEFT DIABETIC;  Surgeon: Galen Manila, MD;  Location: [facility];   Service: Ophthalmology;  Laterality: Left;  9.08 1:06.4   CESAREAN SECTION     CHOLECYSTECTOMY  03-03-12   Porcelain gallbladder, gallstones,  Byrnett   COLONOSCOPY W/ BIOPSIES  04/28/2012   Hyperplastic rectal polyps.   COLONOSCOPY WITH PROPOFOL N/A 04/02/2022   Procedure: COLONOSCOPY WITH PROPOFOL;  Surgeon: Earline Mayotte, MD;  Location: [facility];  Service: Endoscopy;  Laterality: N/A;   CORONARY ARTERY BYPASS GRAFT  2009   3 vessel   CYSTOSCOPY W/ RETROGRADES Right 09/01/2016   Procedure: CYSTOSCOPY WITH RETROGRADE PYELOGRAM;  Surgeon: Vanna Scotland, MD;  Location: [facility];  Service: Urology;  Laterality: Right;   CYSTOSCOPY W/ RETROGRADES Bilateral 03/19/2020   Procedure: CYSTOSCOPY WITH RETROGRADE PYELOGRAM;  Surgeon: Vanna Scotland, MD;  Location: [facility];  Service: Urology;  Laterality: Bilateral;   CYSTOSCOPY WITH BIOPSY N/A 03/19/2020   Procedure: CYSTOSCOPY WITH BIOPSY;  Surgeon: Vanna Scotland, MD;  Location: [facility];  Service: Urology;  Laterality: N/A;   EYE SURGERY     FEMORAL-POPLITEAL BYPASS GRAFT Left 04/29/2023   Procedure: BYPASS GRAFT FEMORAL-POPLITEAL ARTERY;  Surgeon: Renford Dills, MD;  Location: [facility];  Service: Vascular;  Laterality: Left;   HERNIA REPAIR  10-31-14   ventral, retro-rectus atrium mesh   INCISION AND DRAINAGE ABSCESS Left 06/24/2023   Procedure: INCISION AND DRAINAGE WITH SATORIUS FLAP;  Surgeon: Renford Dills, MD;  Location: [facility];  Service: Vascular;  Laterality: Left;   IRRIGATION AND DEBRIDEMENT FOOT Left 01/18/2019   Procedure: IRRIGATION AND DEBRIDEMENT FOOT;  Surgeon: Linus Galas, DPM;  Location: [facility];  Service: Podiatry;  Laterality: Left;   LOWER EXTREMITY ANGIOGRAPHY Left 12/10/2016   Procedure: Lower Extremity Angiography;  Surgeon: Renford Dills, MD;  Location: [facility];  Service: Cardiovascular;  Laterality: Left;   LOWER EXTREMITY ANGIOGRAPHY Left 02/02/2018   Procedure: LOWER EXTREMITY  ANGIOGRAPHY;  Surgeon: Renford Dills, MD;  Location: [facility];  Service: Cardiovascular;  Laterality: Left;   LOWER EXTREMITY ANGIOGRAPHY Left 05/05/2018   Procedure: LOWER EXTREMITY ANGIOGRAPHY;  Surgeon: Renford Dills, MD;  Location: [facility];  Service: Cardiovascular;  Laterality: Left;   LOWER EXTREMITY ANGIOGRAPHY Left 12/04/2020   Procedure: LOWER EXTREMITY ANGIOGRAPHY with Intervention;  Surgeon: Renford Dills, MD;  Location: [facility];  Service: Cardiovascular;  Laterality: Left;   LOWER EXTREMITY ANGIOGRAPHY Left 04/24/2023   Procedure: Lower Extremity Angiography;  Surgeon: Renford Dills, MD;  Location: [facility];  Service: Cardiovascular;  Laterality: Left;   LOWER EXTREMITY ANGIOGRAPHY Left 05/01/2023  Procedure: Lower Extremity Angiography;  Surgeon: Renford Dills, MD;  Location: [facility];  Service: Cardiovascular;  Laterality: Left;   NEPHRECTOMY Left 10-31-14   PERIPHERAL VASCULAR CATHETERIZATION Left 05/01/2015   Procedure: Lower Extremity Angiography;  Surgeon: Renford Dills, MD;  Location: [facility];  Service: Cardiovascular;  Laterality: Left;   PERIPHERAL VASCULAR CATHETERIZATION  05/01/2015   Procedure: Lower Extremity Intervention;  Surgeon: Renford Dills, MD;  Location: [facility];  Service: Cardiovascular;;   PERIPHERAL VASCULAR CATHETERIZATION Left 02/20/2015   Procedure: Pelvic Angiography;  Surgeon: Renford Dills, MD;  Location: [facility];  Service: Cardiovascular;  Laterality: Left;   TRANSMETATARSAL AMPUTATION Left 05/05/2023   Procedure: TRANSMETATARSAL AMPUTATION LEFT;  Surgeon: Gwyneth Revels, DPM;  Location: [facility];  Service: Orthopedics/Podiatry;  Laterality: Left;   TRANSURETHRAL RESECTION OF BLADDER TUMOR WITH MITOMYCIN-C N/A 09/01/2016   Procedure: TRANSURETHRAL RESECTION OF BLADDER TUMOR WITH MITOMYCIN-C;  Surgeon: Vanna Scotland, MD;   Location: [facility];  Service: Urology;  Laterality: N/A;      No Known Allergies  Current Outpatient Medications  Medication Sig Dispense Refill   acetaminophen (TYLENOL) 500 MG tablet Take 1,000 mg by mouth every 6 (six) hours as needed for mild pain or headache.      acidophilus (RISAQUAD) CAPS capsule Take 2 capsules by mouth 2 (two) times daily.     apixaban (ELIQUIS) 2.5 MG TABS tablet Take 1 tablet (2.5 mg total) by mouth 2 (two) times daily. 60 tablet 11   aspirin 81 MG chewable tablet Chew 81 mg by mouth at bedtime.      atorvastatin (LIPITOR) 20 MG tablet TAKE 1 TABLET(20 MG) BY MOUTH DAILY 90 tablet 3   Cholecalciferol (VITAMIN D3) 2000 UNITS TABS Take 2,000 Units by mouth daily after supper.      Continuous Glucose Sensor (FREESTYLE LIBRE 2 SENSOR) MISC Use to check sugar at least 4 times daily 2 each 2   empagliflozin (JARDIANCE) 25 MG TABS tablet TAKE 1 TABLET(25 MG) BY MOUTH DAILY BEFORE AND BREAKFAST 90 tablet 2   gabapentin (NEURONTIN) 300 MG capsule Take 300 mg by mouth 2 (two) times daily.     glucose blood test strip Use you check blood sugars up to four times daily. 100 each 12   insulin degludec (TRESIBA FLEXTOUCH) 100 UNIT/ML FlexTouch Pen Inject 15 Units into the skin daily. Reduced from 22 units daily.     insulin lispro (HUMALOG KWIKPEN) 100 UNIT/ML KwikPen ADMINISTER 5-10 UNITS UNDER THE SKIN THREE TIMES DAILY if you eat a meal.  5 units if blood sugar <200, 10 units if blood sugar >200. 15 mL 1   Insulin Pen Needle (PEN NEEDLES) 32G X 4 MM MISC Use to take insulin daily 100 each 3   Multiple Vitamin (MULTIVITAMIN) tablet Take 1 tablet by mouth daily.     polycarbophil (FIBERCON) 625 MG tablet Take 1 tablet (625 mg total) by mouth 2 (two) times daily. For stool bulking. 60 tablet 0   triamcinolone ointment (KENALOG) 0.5 % Apply 1 Application topically 2 (two) times daily. 80 g 0   Fe Fum-Vit C-Vit B12-FA (TRIGELS-F FORTE) CAPS capsule Take 1 capsule by mouth 2 (two)  times daily. (Patient not taking: Reported on 08/06/2023) 60 capsule 2   losartan (COZAAR) 50 MG tablet Take 50 mg by mouth daily. (Patient not taking: Reported on 08/06/2023)     oxyCODONE-acetaminophen (PERCOCET/ROXICET) 5-325 MG tablet Take 1 tablet by mouth daily as  needed (with wound vac exchanges). (Patient not taking: Reported on 08/06/2023) 15 tablet 0   No current facility-administered medications for this visit.        Physical Exam BP 116/66   Pulse 78   Resp 16   Wt 139 lb (63 kg)   BMI 21.77 kg/m  Gen:  WD/WN, NAD Skin: incision C/well granulated     Assessment/Plan: 1. Vascular graft infection, sequela (Primary) The existing VAC is removed.  The wound is inspected.  There is now just a small segment of graft which is exposed in the medial aspect of the wound which is deep.  The wound is excellent in appearance with a large amount of granulation.  The wound measures 12 x 3 for 36 cm.  A wound VAC dressing is replaced with a black sponge.  Seal is in the green before she leaves the office.      [name] 08/08/2023, 12:09 PM   This note was created with Dragon medical transcription system.  Any errors from dictation are unintentional.

## 2023-08-08 NOTE — Assessment & Plan Note (Signed)
S/p PTCA and stent to Left CFA, L Anterior tibial artery and mechanial thrombectomy followed by transmetarsal amputation

## 2023-08-10 ENCOUNTER — Encounter (INDEPENDENT_AMBULATORY_CARE_PROVIDER_SITE_OTHER): Payer: Self-pay

## 2023-08-10 ENCOUNTER — Ambulatory Visit (INDEPENDENT_AMBULATORY_CARE_PROVIDER_SITE_OTHER): Payer: 59 | Admitting: Vascular Surgery

## 2023-08-10 VITALS — BP 137/65 | HR 72 | Resp 16 | Wt 137.2 lb

## 2023-08-10 DIAGNOSIS — T827XXD Infection and inflammatory reaction due to other cardiac and vascular devices, implants and grafts, subsequent encounter: Secondary | ICD-10-CM | POA: Diagnosis not present

## 2023-08-10 DIAGNOSIS — S31104A Unspecified open wound of abdominal wall, left lower quadrant without penetration into peritoneal cavity, initial encounter: Secondary | ICD-10-CM | POA: Diagnosis not present

## 2023-08-10 DIAGNOSIS — T8189XA Other complications of procedures, not elsewhere classified, initial encounter: Secondary | ICD-10-CM | POA: Diagnosis not present

## 2023-08-10 DIAGNOSIS — T827XXS Infection and inflammatory reaction due to other cardiac and vascular devices, implants and grafts, sequela: Secondary | ICD-10-CM | POA: Diagnosis not present

## 2023-08-11 ENCOUNTER — Encounter: Payer: Self-pay | Admitting: Cardiology

## 2023-08-11 ENCOUNTER — Encounter (INDEPENDENT_AMBULATORY_CARE_PROVIDER_SITE_OTHER): Payer: Self-pay | Admitting: Vascular Surgery

## 2023-08-11 DIAGNOSIS — E1140 Type 2 diabetes mellitus with diabetic neuropathy, unspecified: Secondary | ICD-10-CM | POA: Diagnosis not present

## 2023-08-11 DIAGNOSIS — I739 Peripheral vascular disease, unspecified: Secondary | ICD-10-CM | POA: Diagnosis not present

## 2023-08-11 DIAGNOSIS — S31104A Unspecified open wound of abdominal wall, left lower quadrant without penetration into peritoneal cavity, initial encounter: Secondary | ICD-10-CM | POA: Diagnosis not present

## 2023-08-11 DIAGNOSIS — Z89421 Acquired absence of other right toe(s): Secondary | ICD-10-CM | POA: Diagnosis not present

## 2023-08-11 DIAGNOSIS — T8189XA Other complications of procedures, not elsewhere classified, initial encounter: Secondary | ICD-10-CM | POA: Diagnosis not present

## 2023-08-11 DIAGNOSIS — Z794 Long term (current) use of insulin: Secondary | ICD-10-CM | POA: Diagnosis not present

## 2023-08-11 DIAGNOSIS — Z89432 Acquired absence of left foot: Secondary | ICD-10-CM | POA: Diagnosis not present

## 2023-08-11 NOTE — Progress Notes (Signed)
Patient ID: Laura Reed, female   DOB: [birth year], 63 y.o.   MRN: [id number]  Chief Complaint  Patient presents with   Follow-up    Wound vac change    HPI Laura Reed is a 63 y.o. female.    Patient has no new complaints.  She did have 1 episode of leaking but was able to patch that on her own.   Past Medical History:  Diagnosis Date   Absence of kidney    left   Anxiety    Arthritis    Atherosclerosis of native arteries of extremities with intermittent claudication, bilateral legs (HCC)    Bladder cancer (HCC)    CHF (congestive heart failure) (HCC)    Complication of anesthesia    BP HAS  RUN LOW AFTER SURGERY-LUNGS FILLED UP WITH FLUID AFTER  LEG STENT SURGERY    Coronary artery disease    Diabetes mellitus    Family history of adverse reaction to anesthesia    Sister - PONV   GERD (gastroesophageal reflux disease)    OCC TUMS   Heart murmur    Hemorrhoid    History of methicillin resistant staphylococcus aureus (MRSA) 2007   Hypertension    Neuropathy    PVD (peripheral vascular disease) (HCC)    Thyroid nodule    right   Unspecified osteoarthritis, unspecified site    Urothelial carcinoma of kidney (HCC) 10/31/2014   INVASIVE UROTHELIAL CARCINOMA, LOW GRADE. T1, Nx.   Vitamin D deficiency, unspecified    Wears dentures    full upper and lower    Past Surgical History:  Procedure Laterality Date   AMPUTATION TOE     right (4th and 5th); left (great toe, 3rd)   AMPUTATION TOE Right 07/16/2018   Procedure: AMPUTATION TOE/MPJ right 2nd;  Surgeon: Linus Galas, DPM;  Location: [facility];  Service: Podiatry;  Laterality: Right;   APPLICATION OF WOUND VAC Left 06/26/2023   Procedure: WOUND VAC EXCHANGE LEFT GROIN;  Surgeon: Renford Dills, MD;  Location: [facility];  Service: Vascular;  Laterality: Left;   APPLICATION OF WOUND VAC Left 06/29/2023   Procedure: WOUND VAC EXCHANGE LEFT GROIN;  Surgeon: Renford Dills, MD;  Location: [facility];   Service: Vascular;  Laterality: Left;   APPLICATION OF WOUND VAC Left 07/02/2023   Procedure: WOUND VAC EXCHANGE LEFT GROIN;  Surgeon: Annice Needy, MD;  Location: [facility];  Service: Vascular;  Laterality: Left;   APPLICATION OF WOUND VAC Left 07/06/2023   Procedure: WOUND VAC EXCHANGE LEFT GROIN;  Surgeon: Renford Dills, MD;  Location: [facility];  Service: Vascular;  Laterality: Left;   APPLICATION OF WOUND VAC Left 07/16/2023   Procedure: WOUND VAC EXCHANGE LEFT GROIN;  Surgeon: Renford Dills, MD;  Location: [facility];  Service: Vascular;  Laterality: Left;   APPLICATION OF WOUND VAC Left 07/20/2023   Procedure: WOUND VAC EXCHANGE;  Surgeon: Renford Dills, MD;  Location: [facility];  Service: Vascular;  Laterality: Left;   APPLICATION OF WOUND VAC Left 07/23/2023   Procedure: WOUND VAC EXCHANGE;  Surgeon: Renford Dills, MD;  Location: [facility];  Service: Vascular;  Laterality: Left;   APPLICATION OF WOUND VAC Left 07/13/2023   Procedure: WOUND VAC EXCHANGE LEFT GROIN;  Surgeon: Renford Dills, MD;  Location: [facility];  Service: Vascular;  Laterality: Left;   APPLICATION OF WOUND VAC Left 07/09/2023   Procedure: WOUND VAC EXCHANGE OF LEFT GROIN;  Surgeon: [name]  S, MD;  Location: [facility];  Service: Vascular;  Laterality: Left;   ARTERIAL BYPASS SURGRY  2009, 2013 x 2   right leg , done in [place]   CARDIAC CATHETERIZATION     CAROTID ENDARTERECTOMY Right 01/2014   Dr Gilda Crease   CATARACT EXTRACTION W/PHACO Right 12/14/2014   Procedure: CATARACT EXTRACTION PHACO AND INTRAOCULAR LENS PLACEMENT (IOC);  Surgeon: Lia Hopping, MD;  Location: [facility];  Service: Ophthalmology;  Laterality: Right;  [place]   00:38.6              AP        7.1                   CDE  2.76   CATARACT EXTRACTION W/PHACO Left 12/06/2019   Procedure: CATARACT EXTRACTION PHACO AND INTRAOCULAR LENS PLACEMENT (IOC) LEFT DIABETIC;  Surgeon: Galen Manila, MD;  Location: [facility];  Service:  Ophthalmology;  Laterality: Left;  9.08 1:06.4   CESAREAN SECTION     CHOLECYSTECTOMY  03-03-12   Porcelain gallbladder, gallstones,  Byrnett   COLONOSCOPY W/ BIOPSIES  04/28/2012   Hyperplastic rectal polyps.   COLONOSCOPY WITH PROPOFOL N/A 04/02/2022   Procedure: COLONOSCOPY WITH PROPOFOL;  Surgeon: Earline Mayotte, MD;  Location: [facility];  Service: Endoscopy;  Laterality: N/A;   CORONARY ARTERY BYPASS GRAFT  2009   3 vessel   CYSTOSCOPY W/ RETROGRADES Right 09/01/2016   Procedure: CYSTOSCOPY WITH RETROGRADE PYELOGRAM;  Surgeon: Vanna Scotland, MD;  Location: [facility];  Service: Urology;  Laterality: Right;   CYSTOSCOPY W/ RETROGRADES Bilateral 03/19/2020   Procedure: CYSTOSCOPY WITH RETROGRADE PYELOGRAM;  Surgeon: Vanna Scotland, MD;  Location: [facility];  Service: Urology;  Laterality: Bilateral;   CYSTOSCOPY WITH BIOPSY N/A 03/19/2020   Procedure: CYSTOSCOPY WITH BIOPSY;  Surgeon: Vanna Scotland, MD;  Location: [facility];  Service: Urology;  Laterality: N/A;   EYE SURGERY     FEMORAL-POPLITEAL BYPASS GRAFT Left 04/29/2023   Procedure: BYPASS GRAFT FEMORAL-POPLITEAL ARTERY;  Surgeon: Renford Dills, MD;  Location: [facility];  Service: Vascular;  Laterality: Left;   HERNIA REPAIR  10-31-14   ventral, retro-rectus atrium mesh   INCISION AND DRAINAGE ABSCESS Left 06/24/2023   Procedure: INCISION AND DRAINAGE WITH SATORIUS FLAP;  Surgeon: Renford Dills, MD;  Location: [facility];  Service: Vascular;  Laterality: Left;   IRRIGATION AND DEBRIDEMENT FOOT Left 01/18/2019   Procedure: IRRIGATION AND DEBRIDEMENT FOOT;  Surgeon: Linus Galas, DPM;  Location: [facility];  Service: Podiatry;  Laterality: Left;   LOWER EXTREMITY ANGIOGRAPHY Left 12/10/2016   Procedure: Lower Extremity Angiography;  Surgeon: Renford Dills, MD;  Location: [facility];  Service: Cardiovascular;  Laterality: Left;   LOWER EXTREMITY ANGIOGRAPHY Left 02/02/2018   Procedure: LOWER EXTREMITY ANGIOGRAPHY;   Surgeon: Renford Dills, MD;  Location: [facility];  Service: Cardiovascular;  Laterality: Left;   LOWER EXTREMITY ANGIOGRAPHY Left 05/05/2018   Procedure: LOWER EXTREMITY ANGIOGRAPHY;  Surgeon: Renford Dills, MD;  Location: [facility];  Service: Cardiovascular;  Laterality: Left;   LOWER EXTREMITY ANGIOGRAPHY Left 12/04/2020   Procedure: LOWER EXTREMITY ANGIOGRAPHY with Intervention;  Surgeon: Renford Dills, MD;  Location: [facility];  Service: Cardiovascular;  Laterality: Left;   LOWER EXTREMITY ANGIOGRAPHY Left 04/24/2023   Procedure: Lower Extremity Angiography;  Surgeon: Renford Dills, MD;  Location: [facility];  Service: Cardiovascular;  Laterality: Left;   LOWER EXTREMITY ANGIOGRAPHY Left 05/01/2023  Procedure: Lower Extremity Angiography;  Surgeon: Renford Dills, MD;  Location: [facility];  Service: Cardiovascular;  Laterality: Left;   NEPHRECTOMY Left 10-31-14   PERIPHERAL VASCULAR CATHETERIZATION Left 05/01/2015   Procedure: Lower Extremity Angiography;  Surgeon: Renford Dills, MD;  Location: [facility];  Service: Cardiovascular;  Laterality: Left;   PERIPHERAL VASCULAR CATHETERIZATION  05/01/2015   Procedure: Lower Extremity Intervention;  Surgeon: Renford Dills, MD;  Location: [facility];  Service: Cardiovascular;;   PERIPHERAL VASCULAR CATHETERIZATION Left 02/20/2015   Procedure: Pelvic Angiography;  Surgeon: Renford Dills, MD;  Location: [facility];  Service: Cardiovascular;  Laterality: Left;   TRANSMETATARSAL AMPUTATION Left 05/05/2023   Procedure: TRANSMETATARSAL AMPUTATION LEFT;  Surgeon: Gwyneth Revels, DPM;  Location: [facility];  Service: Orthopedics/Podiatry;  Laterality: Left;   TRANSURETHRAL RESECTION OF BLADDER TUMOR WITH MITOMYCIN-C N/A 09/01/2016   Procedure: TRANSURETHRAL RESECTION OF BLADDER TUMOR WITH MITOMYCIN-C;  Surgeon: Vanna Scotland, MD;  Location: [facility];  Service: Urology;  Laterality: N/A;      No Known Allergies  Current Outpatient Medications  Medication Sig Dispense Refill   acetaminophen (TYLENOL) 500 MG tablet Take 1,000 mg by mouth every 6 (six) hours as needed for mild pain or headache.      acidophilus (RISAQUAD) CAPS capsule Take 2 capsules by mouth 2 (two) times daily.     amoxicillin-clavulanate (AUGMENTIN) 875-125 MG tablet Take 1 tablet by mouth every 12 (twelve) hours. 28 tablet 6   apixaban (ELIQUIS) 2.5 MG TABS tablet Take 1 tablet (2.5 mg total) by mouth 2 (two) times daily. 60 tablet 11   aspirin 81 MG chewable tablet Chew 81 mg by mouth at bedtime.      atorvastatin (LIPITOR) 20 MG tablet TAKE 1 TABLET(20 MG) BY MOUTH DAILY 90 tablet 3   Cholecalciferol (VITAMIN D3) 2000 UNITS TABS Take 2,000 Units by mouth daily after supper.      Continuous Glucose Sensor (FREESTYLE LIBRE 2 SENSOR) MISC Use to check sugar at least 4 times daily 2 each 2   empagliflozin (JARDIANCE) 25 MG TABS tablet TAKE 1 TABLET(25 MG) BY MOUTH DAILY BEFORE AND BREAKFAST 90 tablet 2   gabapentin (NEURONTIN) 300 MG capsule Take 300 mg by mouth 2 (two) times daily.     glucose blood test strip Use you check blood sugars up to four times daily. 100 each 12   insulin degludec (TRESIBA FLEXTOUCH) 100 UNIT/ML FlexTouch Pen Inject 15 Units into the skin daily. Reduced from 22 units daily.     insulin lispro (HUMALOG KWIKPEN) 100 UNIT/ML KwikPen ADMINISTER 5-10 UNITS UNDER THE SKIN THREE TIMES DAILY if you eat a meal.  5 units if blood sugar <200, 10 units if blood sugar >200. 15 mL 1   Insulin Pen Needle (PEN NEEDLES) 32G X 4 MM MISC Use to take insulin daily 100 each 3   Multiple Vitamin (MULTIVITAMIN) tablet Take 1 tablet by mouth daily.     polycarbophil (FIBERCON) 625 MG tablet Take 1 tablet (625 mg total) by mouth 2 (two) times daily. For stool bulking. 60 tablet 0   triamcinolone ointment (KENALOG) 0.5 % Apply 1 Application topically 2 (two) times  daily. 80 g 0   Fe Fum-Vit C-Vit B12-FA (TRIGELS-F FORTE) CAPS capsule Take 1 capsule by mouth 2 (two) times daily. (Patient not taking: Reported on 08/10/2023) 60 capsule 2   losartan (COZAAR) 50 MG tablet Take 50 mg by mouth daily. (Patient not taking:  Reported on 08/06/2023)     oxyCODONE-acetaminophen (PERCOCET/ROXICET) 5-325 MG tablet Take 1 tablet by mouth daily as needed (with wound vac exchanges). (Patient not taking: Reported on 08/10/2023) 15 tablet 0   No current facility-administered medications for this visit.        Physical Exam BP 137/65   Pulse 72   Resp 16   Wt 137 lb 3.2 oz (62.2 kg)   BMI 21.49 kg/m  Gen:  WD/WN, NAD Skin: incision left groin continues to be well granulated and free from infection it appears to be healing and there is epithelial growth along the edges     Assessment/Plan:  1. Vascular graft infection, sequela (Primary) The existing VAC is removed.  The wound is inspected.  There is now just a small segment of graft which is exposed in the medial aspect of the wound which is deep.  The wound is excellent in appearance with a large amount of granulation.   The wound measures 11 x 2.5 for 27.5 cm.   A wound VAC dressing is replaced with a black sponge.  Seal is in the green before she leaves the office.        [name] 08/11/2023, 11:01 AM   This note was created with Dragon medical transcription system.  Any errors from dictation are unintentional.

## 2023-08-12 DIAGNOSIS — E785 Hyperlipidemia, unspecified: Secondary | ICD-10-CM | POA: Diagnosis not present

## 2023-08-12 DIAGNOSIS — M79605 Pain in left leg: Secondary | ICD-10-CM | POA: Diagnosis not present

## 2023-08-12 DIAGNOSIS — Z794 Long term (current) use of insulin: Secondary | ICD-10-CM | POA: Diagnosis not present

## 2023-08-12 DIAGNOSIS — L02214 Cutaneous abscess of groin: Secondary | ICD-10-CM | POA: Diagnosis not present

## 2023-08-12 DIAGNOSIS — I6529 Occlusion and stenosis of unspecified carotid artery: Secondary | ICD-10-CM | POA: Diagnosis not present

## 2023-08-12 DIAGNOSIS — Z8551 Personal history of malignant neoplasm of bladder: Secondary | ICD-10-CM | POA: Diagnosis not present

## 2023-08-12 DIAGNOSIS — E1169 Type 2 diabetes mellitus with other specified complication: Secondary | ICD-10-CM | POA: Diagnosis not present

## 2023-08-12 DIAGNOSIS — Z7982 Long term (current) use of aspirin: Secondary | ICD-10-CM | POA: Diagnosis not present

## 2023-08-12 DIAGNOSIS — R197 Diarrhea, unspecified: Secondary | ICD-10-CM | POA: Diagnosis not present

## 2023-08-12 DIAGNOSIS — E871 Hypo-osmolality and hyponatremia: Secondary | ICD-10-CM | POA: Diagnosis not present

## 2023-08-12 DIAGNOSIS — T8149XA Infection following a procedure, other surgical site, initial encounter: Secondary | ICD-10-CM | POA: Diagnosis not present

## 2023-08-12 DIAGNOSIS — I2510 Atherosclerotic heart disease of native coronary artery without angina pectoris: Secondary | ICD-10-CM | POA: Diagnosis not present

## 2023-08-12 DIAGNOSIS — E1142 Type 2 diabetes mellitus with diabetic polyneuropathy: Secondary | ICD-10-CM | POA: Diagnosis not present

## 2023-08-12 DIAGNOSIS — D649 Anemia, unspecified: Secondary | ICD-10-CM | POA: Diagnosis not present

## 2023-08-12 DIAGNOSIS — Z9181 History of falling: Secondary | ICD-10-CM | POA: Diagnosis not present

## 2023-08-12 DIAGNOSIS — Z87891 Personal history of nicotine dependence: Secondary | ICD-10-CM | POA: Diagnosis not present

## 2023-08-12 DIAGNOSIS — I5032 Chronic diastolic (congestive) heart failure: Secondary | ICD-10-CM | POA: Diagnosis not present

## 2023-08-12 DIAGNOSIS — E1151 Type 2 diabetes mellitus with diabetic peripheral angiopathy without gangrene: Secondary | ICD-10-CM | POA: Diagnosis not present

## 2023-08-12 DIAGNOSIS — Z85528 Personal history of other malignant neoplasm of kidney: Secondary | ICD-10-CM | POA: Diagnosis not present

## 2023-08-12 DIAGNOSIS — I110 Hypertensive heart disease with heart failure: Secondary | ICD-10-CM | POA: Diagnosis not present

## 2023-08-12 DIAGNOSIS — Z905 Acquired absence of kidney: Secondary | ICD-10-CM | POA: Diagnosis not present

## 2023-08-12 DIAGNOSIS — Z89422 Acquired absence of other left toe(s): Secondary | ICD-10-CM | POA: Diagnosis not present

## 2023-08-12 DIAGNOSIS — D72829 Elevated white blood cell count, unspecified: Secondary | ICD-10-CM | POA: Diagnosis not present

## 2023-08-13 ENCOUNTER — Ambulatory Visit: Payer: 59 | Admitting: Cardiology

## 2023-08-13 ENCOUNTER — Encounter (INDEPENDENT_AMBULATORY_CARE_PROVIDER_SITE_OTHER): Payer: Self-pay

## 2023-08-13 ENCOUNTER — Ambulatory Visit (INDEPENDENT_AMBULATORY_CARE_PROVIDER_SITE_OTHER): Payer: 59 | Admitting: Vascular Surgery

## 2023-08-13 VITALS — BP 109/57 | HR 68 | Resp 16 | Wt 137.0 lb

## 2023-08-13 DIAGNOSIS — T827XXS Infection and inflammatory reaction due to other cardiac and vascular devices, implants and grafts, sequela: Secondary | ICD-10-CM

## 2023-08-16 ENCOUNTER — Encounter (INDEPENDENT_AMBULATORY_CARE_PROVIDER_SITE_OTHER): Payer: Self-pay | Admitting: Vascular Surgery

## 2023-08-16 NOTE — Progress Notes (Signed)
 Patient ID: Laura Reed, female   DOB: January 08, 1961, 63 y.o.   MRN: [id number]  Chief Complaint  Patient presents with   Follow-up    Wound vac change    HPI Laura Reed is a 63 y.o. female.    Patient returns to the office for evaluation of her left groin wound.  She has no new complaints.   Past Medical History:  Diagnosis Date   Absence of kidney    left   Anxiety    Arthritis    Atherosclerosis of native arteries of extremities with intermittent claudication, bilateral legs (HCC)    Bladder cancer (HCC)    CHF (congestive heart failure) (HCC)    Complication of anesthesia    BP HAS  RUN LOW AFTER SURGERY-LUNGS FILLED UP WITH FLUID AFTER  LEG STENT SURGERY    Coronary artery disease    Diabetes mellitus    Family history of adverse reaction to anesthesia    Sister - PONV   GERD (gastroesophageal reflux disease)    OCC TUMS   Heart murmur    Hemorrhoid    History of methicillin resistant staphylococcus aureus (MRSA) 2007   Hypertension    Neuropathy    PVD (peripheral vascular disease) (HCC)    Thyroid  nodule    right   Unspecified osteoarthritis, unspecified site    Urothelial carcinoma of kidney (HCC) 10/31/2014   INVASIVE UROTHELIAL CARCINOMA, LOW GRADE. T1, Nx.   Vitamin D  deficiency, unspecified    Wears dentures    full upper and lower    Past Surgical History:  Procedure Laterality Date   AMPUTATION TOE     right (4th and 5th); left (great toe, 3rd)   AMPUTATION TOE Right 07/16/2018   Procedure: AMPUTATION TOE/MPJ right 2nd;  Surgeon: Neill Boas, DPM;  Location: [facility];  Service: Podiatry;  Laterality: Right;   APPLICATION OF WOUND VAC Left 06/26/2023   Procedure: WOUND VAC EXCHANGE LEFT GROIN;  Surgeon: Jama Cordella MATSU, MD;  Location: [facility];  Service: Vascular;  Laterality: Left;   APPLICATION OF WOUND VAC Left 06/29/2023   Procedure: WOUND VAC EXCHANGE LEFT GROIN;  Surgeon: Jama Cordella MATSU, MD;  Location: [facility];  Service:  Vascular;  Laterality: Left;   APPLICATION OF WOUND VAC Left 07/02/2023   Procedure: WOUND VAC EXCHANGE LEFT GROIN;  Surgeon: Marea Selinda RAMAN, MD;  Location: [facility];  Service: Vascular;  Laterality: Left;   APPLICATION OF WOUND VAC Left 07/06/2023   Procedure: WOUND VAC EXCHANGE LEFT GROIN;  Surgeon: Jama Cordella MATSU, MD;  Location: [facility];  Service: Vascular;  Laterality: Left;   APPLICATION OF WOUND VAC Left 07/16/2023   Procedure: WOUND VAC EXCHANGE LEFT GROIN;  Surgeon: Jama Cordella MATSU, MD;  Location: [facility];  Service: Vascular;  Laterality: Left;   APPLICATION OF WOUND VAC Left 07/20/2023   Procedure: WOUND VAC EXCHANGE;  Surgeon: Jama Cordella MATSU, MD;  Location: [facility];  Service: Vascular;  Laterality: Left;   APPLICATION OF WOUND VAC Left 07/23/2023   Procedure: WOUND VAC EXCHANGE;  Surgeon: Jama Cordella MATSU, MD;  Location: [facility];  Service: Vascular;  Laterality: Left;   APPLICATION OF WOUND VAC Left 07/13/2023   Procedure: WOUND VAC EXCHANGE LEFT GROIN;  Surgeon: Jama Cordella MATSU, MD;  Location: [facility];  Service: Vascular;  Laterality: Left;   APPLICATION OF WOUND VAC Left 07/09/2023   Procedure: WOUND VAC EXCHANGE OF LEFT GROIN;  Surgeon: Marea Selinda RAMAN, MD;  Location:  [facility];  Service: Vascular;  Laterality: Left;   ARTERIAL BYPASS SURGRY  2009, 2013 x 2   right leg , done in [place]   CARDIAC CATHETERIZATION     CAROTID ENDARTERECTOMY Right 01/2014   Dr Jama   CATARACT EXTRACTION W/PHACO Right 12/14/2014   Procedure: CATARACT EXTRACTION PHACO AND INTRAOCULAR LENS PLACEMENT (IOC);  Surgeon: Newell Ovens, MD;  Location: [facility];  Service: Ophthalmology;  Laterality: Right;  US    00:38.6              AP        7.1                   CDE  2.76   CATARACT EXTRACTION W/PHACO Left 12/06/2019   Procedure: CATARACT EXTRACTION PHACO AND INTRAOCULAR LENS PLACEMENT (IOC) LEFT DIABETIC;  Surgeon: Jaye Fallow, MD;  Location: [facility];  Service: Ophthalmology;   Laterality: Left;  9.08 1:06.4   CESAREAN SECTION     CHOLECYSTECTOMY  03-03-12   Porcelain gallbladder, gallstones,  Byrnett   COLONOSCOPY W/ BIOPSIES  04/28/2012   Hyperplastic rectal polyps.   COLONOSCOPY WITH PROPOFOL  N/A 04/02/2022   Procedure: COLONOSCOPY WITH PROPOFOL ;  Surgeon: Dessa Reyes ORN, MD;  Location: [facility];  Service: Endoscopy;  Laterality: N/A;   CORONARY ARTERY BYPASS GRAFT  2009   3 vessel   CYSTOSCOPY W/ RETROGRADES Right 09/01/2016   Procedure: CYSTOSCOPY WITH RETROGRADE PYELOGRAM;  Surgeon: Rosina Riis, MD;  Location: [facility];  Service: Urology;  Laterality: Right;   CYSTOSCOPY W/ RETROGRADES Bilateral 03/19/2020   Procedure: CYSTOSCOPY WITH RETROGRADE PYELOGRAM;  Surgeon: Riis Rosina, MD;  Location: [facility];  Service: Urology;  Laterality: Bilateral;   CYSTOSCOPY WITH BIOPSY N/A 03/19/2020   Procedure: CYSTOSCOPY WITH BIOPSY;  Surgeon: Riis Rosina, MD;  Location: [facility];  Service: Urology;  Laterality: N/A;   EYE SURGERY     FEMORAL-POPLITEAL BYPASS GRAFT Left 04/29/2023   Procedure: BYPASS GRAFT FEMORAL-POPLITEAL ARTERY;  Surgeon: Jama Cordella MATSU, MD;  Location: [facility];  Service: Vascular;  Laterality: Left;   HERNIA REPAIR  10-31-14   ventral, retro-rectus atrium mesh   INCISION AND DRAINAGE ABSCESS Left 06/24/2023   Procedure: INCISION AND DRAINAGE WITH SATORIUS FLAP;  Surgeon: Jama Cordella MATSU, MD;  Location: [facility];  Service: Vascular;  Laterality: Left;   IRRIGATION AND DEBRIDEMENT FOOT Left 01/18/2019   Procedure: IRRIGATION AND DEBRIDEMENT FOOT;  Surgeon: Neill Boas, DPM;  Location: [facility];  Service: Podiatry;  Laterality: Left;   LOWER EXTREMITY ANGIOGRAPHY Left 12/10/2016   Procedure: Lower Extremity Angiography;  Surgeon: Jama Cordella MATSU, MD;  Location: [facility];  Service: Cardiovascular;  Laterality: Left;   LOWER EXTREMITY ANGIOGRAPHY Left 02/02/2018   Procedure: LOWER EXTREMITY ANGIOGRAPHY;  Surgeon:  Jama Cordella MATSU, MD;  Location: [facility];  Service: Cardiovascular;  Laterality: Left;   LOWER EXTREMITY ANGIOGRAPHY Left 05/05/2018   Procedure: LOWER EXTREMITY ANGIOGRAPHY;  Surgeon: Jama Cordella MATSU, MD;  Location: [facility];  Service: Cardiovascular;  Laterality: Left;   LOWER EXTREMITY ANGIOGRAPHY Left 12/04/2020   Procedure: LOWER EXTREMITY ANGIOGRAPHY with Intervention;  Surgeon: Jama Cordella MATSU, MD;  Location: [facility];  Service: Cardiovascular;  Laterality: Left;   LOWER EXTREMITY ANGIOGRAPHY Left 04/24/2023   Procedure: Lower Extremity Angiography;  Surgeon: Jama Cordella MATSU, MD;  Location: [facility];  Service: Cardiovascular;  Laterality: Left;   LOWER EXTREMITY ANGIOGRAPHY Left 05/01/2023   Procedure: Lower  Extremity Angiography;  Surgeon: Jama Cordella MATSU, MD;  Location: [facility];  Service: Cardiovascular;  Laterality: Left;   NEPHRECTOMY Left 10-31-14   PERIPHERAL VASCULAR CATHETERIZATION Left 05/01/2015   Procedure: Lower Extremity Angiography;  Surgeon: Cordella MATSU Jama, MD;  Location: [facility];  Service: Cardiovascular;  Laterality: Left;   PERIPHERAL VASCULAR CATHETERIZATION  05/01/2015   Procedure: Lower Extremity Intervention;  Surgeon: Cordella MATSU Jama, MD;  Location: [facility];  Service: Cardiovascular;;   PERIPHERAL VASCULAR CATHETERIZATION Left 02/20/2015   Procedure: Pelvic Angiography;  Surgeon: Cordella MATSU Jama, MD;  Location: [facility];  Service: Cardiovascular;  Laterality: Left;   TRANSMETATARSAL AMPUTATION Left 05/05/2023   Procedure: TRANSMETATARSAL AMPUTATION LEFT;  Surgeon: Ashley Soulier, DPM;  Location: [facility];  Service: Orthopedics/Podiatry;  Laterality: Left;   TRANSURETHRAL RESECTION OF BLADDER TUMOR WITH MITOMYCIN -C N/A 09/01/2016   Procedure: TRANSURETHRAL RESECTION OF BLADDER TUMOR WITH MITOMYCIN -C;  Surgeon: Rosina Riis, MD;  Location: [facility];   Service: Urology;  Laterality: N/A;      No Known Allergies  Current Outpatient Medications  Medication Sig Dispense Refill   acetaminophen  (TYLENOL ) 500 MG tablet Take 1,000 mg by mouth every 6 (six) hours as needed for mild pain or headache.      acidophilus (RISAQUAD) CAPS capsule Take 2 capsules by mouth 2 (two) times daily.     amoxicillin -clavulanate (AUGMENTIN ) 875-125 MG tablet Take 1 tablet by mouth every 12 (twelve) hours. 28 tablet 6   apixaban  (ELIQUIS ) 2.5 MG TABS tablet Take 1 tablet (2.5 mg total) by mouth 2 (two) times daily. 60 tablet 11   aspirin  81 MG chewable tablet Chew 81 mg by mouth at bedtime.      atorvastatin  (LIPITOR) 20 MG tablet TAKE 1 TABLET(20 MG) BY MOUTH DAILY 90 tablet 3   Cholecalciferol  (VITAMIN D3) 2000 UNITS TABS Take 2,000 Units by mouth daily after supper.      Continuous Glucose Sensor (FREESTYLE LIBRE 2 SENSOR) MISC Use to check sugar at least 4 times daily 2 each 2   empagliflozin  (JARDIANCE ) 25 MG TABS tablet TAKE 1 TABLET(25 MG) BY MOUTH DAILY BEFORE AND BREAKFAST 90 tablet 2   gabapentin  (NEURONTIN ) 300 MG capsule Take 300 mg by mouth 2 (two) times daily.     glucose blood test strip Use you check blood sugars up to four times daily. 100 each 12   insulin  degludec (TRESIBA  FLEXTOUCH) 100 UNIT/ML FlexTouch Pen Inject 15 Units into the skin daily. Reduced from 22 units daily.     insulin  lispro (HUMALOG  KWIKPEN) 100 UNIT/ML KwikPen ADMINISTER 5-10 UNITS UNDER THE SKIN THREE TIMES DAILY if you eat a meal.  5 units if blood sugar <200, 10 units if blood sugar >200. 15 mL 1   Insulin  Pen Needle (PEN NEEDLES) 32G X 4 MM MISC Use to take insulin  daily 100 each 3   Multiple Vitamin (MULTIVITAMIN) tablet Take 1 tablet by mouth daily.     polycarbophil (FIBERCON) 625 MG tablet Take 1 tablet (625 mg total) by mouth 2 (two) times daily. For stool bulking. 60 tablet 0   triamcinolone  ointment (KENALOG ) 0.5 % Apply 1 Application topically 2 (two) times daily.  80 g 0   Fe Fum-Vit C-Vit B12-FA (TRIGELS-F FORTE) CAPS capsule Take 1 capsule by mouth 2 (two) times daily. (Patient not taking: Reported on 08/06/2023) 60 capsule 2   losartan  (COZAAR ) 50 MG tablet Take 50 mg by mouth daily. (Patient not taking: Reported on  08/06/2023)     oxyCODONE -acetaminophen  (PERCOCET/ROXICET) 5-325 MG tablet Take 1 tablet by mouth daily as needed (with wound vac exchanges). (Patient not taking: Reported on 08/06/2023) 15 tablet 0   No current facility-administered medications for this visit.        Physical Exam BP (!) 109/57   Pulse 68   Resp 16   Wt 137 lb (62.1 kg)   BMI 21.46 kg/m  Gen:  WD/WN, NAD Skin: VAC is removed the wound is very clean richly granulated and the graft now appears covered.  It measures 11 cm x 2.5 cm for a total of 27.5 cm not significantly changed compared to the previous visit     Assessment/Plan:  1. Vascular graft infection, sequela (Primary) The existing VAC is removed.  The wound is inspected.  There is now just a small segment of graft which is exposed in the medial aspect of the wound which is deep.  The wound is excellent in appearance with a large amount of granulation.   The wound measures 11 x 2.5 for 27.5 cm.  Not significantly different compared to the previous dressing change several days ago   A wound VAC dressing is replaced with a black sponge.  Seal is in the green before she leaves the office.     Cordella Shawl 08/16/2023, 2:47 PM   This note was created with Dragon medical transcription system.  Any errors from dictation are unintentional.

## 2023-08-17 ENCOUNTER — Encounter (INDEPENDENT_AMBULATORY_CARE_PROVIDER_SITE_OTHER): Payer: Self-pay

## 2023-08-17 ENCOUNTER — Ambulatory Visit (INDEPENDENT_AMBULATORY_CARE_PROVIDER_SITE_OTHER): Payer: 59 | Admitting: Vascular Surgery

## 2023-08-17 VITALS — BP 121/59 | HR 76 | Resp 16 | Wt 137.0 lb

## 2023-08-17 DIAGNOSIS — T827XXS Infection and inflammatory reaction due to other cardiac and vascular devices, implants and grafts, sequela: Secondary | ICD-10-CM | POA: Diagnosis not present

## 2023-08-17 DIAGNOSIS — T827XXD Infection and inflammatory reaction due to other cardiac and vascular devices, implants and grafts, subsequent encounter: Secondary | ICD-10-CM | POA: Diagnosis not present

## 2023-08-18 ENCOUNTER — Telehealth (INDEPENDENT_AMBULATORY_CARE_PROVIDER_SITE_OTHER): Payer: Self-pay

## 2023-08-18 NOTE — Telephone Encounter (Signed)
Brynn called from [facility] wanting notes and measurements for the patient.   Please advise

## 2023-08-19 ENCOUNTER — Encounter (INDEPENDENT_AMBULATORY_CARE_PROVIDER_SITE_OTHER): Payer: Self-pay | Admitting: Vascular Surgery

## 2023-08-19 DIAGNOSIS — Z905 Acquired absence of kidney: Secondary | ICD-10-CM | POA: Diagnosis not present

## 2023-08-19 DIAGNOSIS — I6529 Occlusion and stenosis of unspecified carotid artery: Secondary | ICD-10-CM | POA: Diagnosis not present

## 2023-08-19 DIAGNOSIS — Z85528 Personal history of other malignant neoplasm of kidney: Secondary | ICD-10-CM | POA: Diagnosis not present

## 2023-08-19 DIAGNOSIS — E1142 Type 2 diabetes mellitus with diabetic polyneuropathy: Secondary | ICD-10-CM | POA: Diagnosis not present

## 2023-08-19 DIAGNOSIS — Z89422 Acquired absence of other left toe(s): Secondary | ICD-10-CM | POA: Diagnosis not present

## 2023-08-19 DIAGNOSIS — E1151 Type 2 diabetes mellitus with diabetic peripheral angiopathy without gangrene: Secondary | ICD-10-CM | POA: Diagnosis not present

## 2023-08-19 DIAGNOSIS — D649 Anemia, unspecified: Secondary | ICD-10-CM | POA: Diagnosis not present

## 2023-08-19 DIAGNOSIS — I110 Hypertensive heart disease with heart failure: Secondary | ICD-10-CM | POA: Diagnosis not present

## 2023-08-19 DIAGNOSIS — M79605 Pain in left leg: Secondary | ICD-10-CM | POA: Diagnosis not present

## 2023-08-19 DIAGNOSIS — Z9181 History of falling: Secondary | ICD-10-CM | POA: Diagnosis not present

## 2023-08-19 DIAGNOSIS — I5032 Chronic diastolic (congestive) heart failure: Secondary | ICD-10-CM | POA: Diagnosis not present

## 2023-08-19 DIAGNOSIS — R197 Diarrhea, unspecified: Secondary | ICD-10-CM | POA: Diagnosis not present

## 2023-08-19 DIAGNOSIS — L02214 Cutaneous abscess of groin: Secondary | ICD-10-CM | POA: Diagnosis not present

## 2023-08-19 DIAGNOSIS — Z87891 Personal history of nicotine dependence: Secondary | ICD-10-CM | POA: Diagnosis not present

## 2023-08-19 DIAGNOSIS — E871 Hypo-osmolality and hyponatremia: Secondary | ICD-10-CM | POA: Diagnosis not present

## 2023-08-19 DIAGNOSIS — I2510 Atherosclerotic heart disease of native coronary artery without angina pectoris: Secondary | ICD-10-CM | POA: Diagnosis not present

## 2023-08-19 DIAGNOSIS — E785 Hyperlipidemia, unspecified: Secondary | ICD-10-CM | POA: Diagnosis not present

## 2023-08-19 DIAGNOSIS — E1169 Type 2 diabetes mellitus with other specified complication: Secondary | ICD-10-CM | POA: Diagnosis not present

## 2023-08-19 DIAGNOSIS — Z7982 Long term (current) use of aspirin: Secondary | ICD-10-CM | POA: Diagnosis not present

## 2023-08-19 DIAGNOSIS — D72829 Elevated white blood cell count, unspecified: Secondary | ICD-10-CM | POA: Diagnosis not present

## 2023-08-19 DIAGNOSIS — Z8551 Personal history of malignant neoplasm of bladder: Secondary | ICD-10-CM | POA: Diagnosis not present

## 2023-08-19 DIAGNOSIS — Z794 Long term (current) use of insulin: Secondary | ICD-10-CM | POA: Diagnosis not present

## 2023-08-19 DIAGNOSIS — T8149XA Infection following a procedure, other surgical site, initial encounter: Secondary | ICD-10-CM | POA: Diagnosis not present

## 2023-08-19 NOTE — Progress Notes (Signed)
Patient ID: Laura Reed, female   DOB: 06/12/1961, 63 y.o.   MRN: [id number]  Chief Complaint  Patient presents with   Follow-up    Wound vac change    HPI Laura Reed is a 63 y.o. female.    Patient returns to the office for evaluation of her left groin wound. She has no new complaints. Some minor leaks since last change    Past Medical History:  Diagnosis Date   Absence of kidney    left   Anxiety    Arthritis    Atherosclerosis of native arteries of extremities with intermittent claudication, bilateral legs (HCC)    Bladder cancer (HCC)    CHF (congestive heart failure) (HCC)    Complication of anesthesia    BP HAS  RUN LOW AFTER SURGERY-LUNGS FILLED UP WITH FLUID AFTER  LEG STENT SURGERY    Coronary artery disease    Diabetes mellitus    Family history of adverse reaction to anesthesia    Sister - PONV   GERD (gastroesophageal reflux disease)    OCC TUMS   Heart murmur    Hemorrhoid    History of methicillin resistant staphylococcus aureus (MRSA) 2007   Hypertension    Neuropathy    PVD (peripheral vascular disease) (HCC)    Thyroid nodule    right   Unspecified osteoarthritis, unspecified site    Urothelial carcinoma of kidney (HCC) 10/31/2014   INVASIVE UROTHELIAL CARCINOMA, LOW GRADE. T1, Nx.   Vitamin D deficiency, unspecified    Wears dentures    full upper and lower    Past Surgical History:  Procedure Laterality Date   AMPUTATION TOE     right (4th and 5th); left (great toe, 3rd)   AMPUTATION TOE Right 07/16/2018   Procedure: AMPUTATION TOE/MPJ right 2nd;  Surgeon: Linus Galas, DPM;  Location: [facility];  Service: Podiatry;  Laterality: Right;   APPLICATION OF WOUND VAC Left 06/26/2023   Procedure: WOUND VAC EXCHANGE LEFT GROIN;  Surgeon: Renford Dills, MD;  Location: [facility];  Service: Vascular;  Laterality: Left;   APPLICATION OF WOUND VAC Left 06/29/2023   Procedure: WOUND VAC EXCHANGE LEFT GROIN;  Surgeon: Renford Dills, MD;   Location: [facility];  Service: Vascular;  Laterality: Left;   APPLICATION OF WOUND VAC Left 07/02/2023   Procedure: WOUND VAC EXCHANGE LEFT GROIN;  Surgeon: Annice Needy, MD;  Location: [facility];  Service: Vascular;  Laterality: Left;   APPLICATION OF WOUND VAC Left 07/06/2023   Procedure: WOUND VAC EXCHANGE LEFT GROIN;  Surgeon: Renford Dills, MD;  Location: [facility];  Service: Vascular;  Laterality: Left;   APPLICATION OF WOUND VAC Left 07/16/2023   Procedure: WOUND VAC EXCHANGE LEFT GROIN;  Surgeon: Renford Dills, MD;  Location: [facility];  Service: Vascular;  Laterality: Left;   APPLICATION OF WOUND VAC Left 07/20/2023   Procedure: WOUND VAC EXCHANGE;  Surgeon: Renford Dills, MD;  Location: [facility];  Service: Vascular;  Laterality: Left;   APPLICATION OF WOUND VAC Left 07/23/2023   Procedure: WOUND VAC EXCHANGE;  Surgeon: Renford Dills, MD;  Location: [facility];  Service: Vascular;  Laterality: Left;   APPLICATION OF WOUND VAC Left 07/13/2023   Procedure: WOUND VAC EXCHANGE LEFT GROIN;  Surgeon: Renford Dills, MD;  Location: [facility];  Service: Vascular;  Laterality: Left;   APPLICATION OF WOUND VAC Left 07/09/2023   Procedure: WOUND VAC EXCHANGE OF LEFT GROIN;  Surgeon:  Annice Needy, MD;  Location: [facility];  Service: Vascular;  Laterality: Left;   ARTERIAL BYPASS SURGRY  2009, 2013 x 2   right leg , done in [place]   CARDIAC CATHETERIZATION     CAROTID ENDARTERECTOMY Right 01/2014   Dr Gilda Crease   CATARACT EXTRACTION W/PHACO Right 12/14/2014   Procedure: CATARACT EXTRACTION PHACO AND INTRAOCULAR LENS PLACEMENT (IOC);  Surgeon: Lia Hopping, MD;  Location: [facility];  Service: Ophthalmology;  Laterality: Right;  [place]   00:38.6              AP        7.1                   CDE  2.76   CATARACT EXTRACTION W/PHACO Left 12/06/2019   Procedure: CATARACT EXTRACTION PHACO AND INTRAOCULAR LENS PLACEMENT (IOC) LEFT DIABETIC;  Surgeon: Galen Manila, MD;  Location: [facility];   Service: Ophthalmology;  Laterality: Left;  9.08 1:06.4   CESAREAN SECTION     CHOLECYSTECTOMY  03-03-12   Porcelain gallbladder, gallstones,  Byrnett   COLONOSCOPY W/ BIOPSIES  04/28/2012   Hyperplastic rectal polyps.   COLONOSCOPY WITH PROPOFOL N/A 04/02/2022   Procedure: COLONOSCOPY WITH PROPOFOL;  Surgeon: Earline Mayotte, MD;  Location: [facility];  Service: Endoscopy;  Laterality: N/A;   CORONARY ARTERY BYPASS GRAFT  2009   3 vessel   CYSTOSCOPY W/ RETROGRADES Right 09/01/2016   Procedure: CYSTOSCOPY WITH RETROGRADE PYELOGRAM;  Surgeon: Vanna Scotland, MD;  Location: [facility];  Service: Urology;  Laterality: Right;   CYSTOSCOPY W/ RETROGRADES Bilateral 03/19/2020   Procedure: CYSTOSCOPY WITH RETROGRADE PYELOGRAM;  Surgeon: Vanna Scotland, MD;  Location: [facility];  Service: Urology;  Laterality: Bilateral;   CYSTOSCOPY WITH BIOPSY N/A 03/19/2020   Procedure: CYSTOSCOPY WITH BIOPSY;  Surgeon: Vanna Scotland, MD;  Location: [facility];  Service: Urology;  Laterality: N/A;   EYE SURGERY     FEMORAL-POPLITEAL BYPASS GRAFT Left 04/29/2023   Procedure: BYPASS GRAFT FEMORAL-POPLITEAL ARTERY;  Surgeon: Renford Dills, MD;  Location: [facility];  Service: Vascular;  Laterality: Left;   HERNIA REPAIR  10-31-14   ventral, retro-rectus atrium mesh   INCISION AND DRAINAGE ABSCESS Left 06/24/2023   Procedure: INCISION AND DRAINAGE WITH SATORIUS FLAP;  Surgeon: Renford Dills, MD;  Location: [facility];  Service: Vascular;  Laterality: Left;   IRRIGATION AND DEBRIDEMENT FOOT Left 01/18/2019   Procedure: IRRIGATION AND DEBRIDEMENT FOOT;  Surgeon: Linus Galas, DPM;  Location: [facility];  Service: Podiatry;  Laterality: Left;   LOWER EXTREMITY ANGIOGRAPHY Left 12/10/2016   Procedure: Lower Extremity Angiography;  Surgeon: Renford Dills, MD;  Location: [facility];  Service: Cardiovascular;  Laterality: Left;   LOWER EXTREMITY ANGIOGRAPHY Left 02/02/2018   Procedure: LOWER EXTREMITY  ANGIOGRAPHY;  Surgeon: Renford Dills, MD;  Location: [facility];  Service: Cardiovascular;  Laterality: Left;   LOWER EXTREMITY ANGIOGRAPHY Left 05/05/2018   Procedure: LOWER EXTREMITY ANGIOGRAPHY;  Surgeon: Renford Dills, MD;  Location: [facility];  Service: Cardiovascular;  Laterality: Left;   LOWER EXTREMITY ANGIOGRAPHY Left 12/04/2020   Procedure: LOWER EXTREMITY ANGIOGRAPHY with Intervention;  Surgeon: Renford Dills, MD;  Location: [facility];  Service: Cardiovascular;  Laterality: Left;   LOWER EXTREMITY ANGIOGRAPHY Left 04/24/2023   Procedure: Lower Extremity Angiography;  Surgeon: Renford Dills, MD;  Location: [facility];  Service: Cardiovascular;  Laterality: Left;   LOWER EXTREMITY ANGIOGRAPHY  Left 05/01/2023   Procedure: Lower Extremity Angiography;  Surgeon: Renford Dills, MD;  [facility];  Service: Cardiovascular;  Laterality: Left;   NEPHRECTOMY Left 10-31-14   PERIPHERAL VASCULAR CATHETERIZATION Left 05/01/2015   Procedure: Lower Extremity Angiography;  Surgeon: Renford Dills, MD;  Location: [facility];  Service: Cardiovascular;  Laterality: Left;   PERIPHERAL VASCULAR CATHETERIZATION  05/01/2015   Procedure: Lower Extremity Intervention;  Surgeon: Renford Dills, MD;  Location: [facility];  Service: Cardiovascular;;   PERIPHERAL VASCULAR CATHETERIZATION Left 02/20/2015   Procedure: Pelvic Angiography;  Surgeon: Renford Dills, MD;  Location: [facility];  Service: Cardiovascular;  Laterality: Left;   TRANSMETATARSAL AMPUTATION Left 05/05/2023   Procedure: TRANSMETATARSAL AMPUTATION LEFT;  Surgeon: Gwyneth Revels, DPM;  Location: [facility];  Service: Orthopedics/Podiatry;  Laterality: Left;   TRANSURETHRAL RESECTION OF BLADDER TUMOR WITH MITOMYCIN-C N/A 09/01/2016   Procedure: TRANSURETHRAL RESECTION OF BLADDER TUMOR WITH MITOMYCIN-C;  Surgeon: Vanna Scotland, MD;   Location: [facility];  Service: Urology;  Laterality: N/A;      No Known Allergies  Current Outpatient Medications  Medication Sig Dispense Refill   acetaminophen (TYLENOL) 500 MG tablet Take 1,000 mg by mouth every 6 (six) hours as needed for mild pain or headache.      acidophilus (RISAQUAD) CAPS capsule Take 2 capsules by mouth 2 (two) times daily.     amoxicillin-clavulanate (AUGMENTIN) 875-125 MG tablet Take 1 tablet by mouth every 12 (twelve) hours. 28 tablet 6   apixaban (ELIQUIS) 2.5 MG TABS tablet Take 1 tablet (2.5 mg total) by mouth 2 (two) times daily. 60 tablet 11   aspirin 81 MG chewable tablet Chew 81 mg by mouth at bedtime.      atorvastatin (LIPITOR) 20 MG tablet TAKE 1 TABLET(20 MG) BY MOUTH DAILY 90 tablet 3   Cholecalciferol (VITAMIN D3) 2000 UNITS TABS Take 2,000 Units by mouth daily after supper.      Continuous Glucose Sensor (FREESTYLE LIBRE 2 SENSOR) MISC Use to check sugar at least 4 times daily 2 each 2   empagliflozin (JARDIANCE) 25 MG TABS tablet TAKE 1 TABLET(25 MG) BY MOUTH DAILY BEFORE AND BREAKFAST 90 tablet 2   gabapentin (NEURONTIN) 300 MG capsule Take 300 mg by mouth 2 (two) times daily.     glucose blood test strip Use you check blood sugars up to four times daily. 100 each 12   insulin degludec (TRESIBA FLEXTOUCH) 100 UNIT/ML FlexTouch Pen Inject 15 Units into the skin daily. Reduced from 22 units daily.     insulin lispro (HUMALOG KWIKPEN) 100 UNIT/ML KwikPen ADMINISTER 5-10 UNITS UNDER THE SKIN THREE TIMES DAILY if you eat a meal.  5 units if blood sugar <200, 10 units if blood sugar >200. 15 mL 1   Insulin Pen Needle (PEN NEEDLES) 32G X 4 MM MISC Use to take insulin daily 100 each 3   Multiple Vitamin (MULTIVITAMIN) tablet Take 1 tablet by mouth daily.     polycarbophil (FIBERCON) 625 MG tablet Take 1 tablet (625 mg total) by mouth 2 (two) times daily. For stool bulking. 60 tablet 0   triamcinolone ointment (KENALOG) 0.5 % Apply 1 Application topically  2 (two) times daily. 80 g 0   Fe Fum-Vit C-Vit B12-FA (TRIGELS-F FORTE) CAPS capsule Take 1 capsule by mouth 2 (two) times daily. (Patient not taking: Reported on 08/17/2023) 60 capsule 2   losartan (COZAAR) 50 MG tablet Take 50 mg by mouth  daily. (Patient not taking: Reported on 08/06/2023)     oxyCODONE-acetaminophen (PERCOCET/ROXICET) 5-325 MG tablet Take 1 tablet by mouth daily as needed (with wound vac exchanges). (Patient not taking: Reported on 08/17/2023) 15 tablet 0   No current facility-administered medications for this visit.        Physical Exam BP (!) 121/59   Pulse 76   Resp 16   Wt 137 lb (62.1 kg)   BMI 21.46 kg/m  Gen:  WD/WN, NAD Skin: wound is clean and well granulating  the graft now appears covered     Assessment/Plan: 1. Vascular graft infection, sequela (Primary) The existing VAC is removed.  The wound is inspected.  There is now just a small segment of graft which is exposed in the medial aspect of the wound which is deep.  The wound is excellent in appearance with a large amount of granulation.   The wound measures 11 x 2.5 for 27.5 cm.  Not significantly different compared to the previous dressing change several days ago   A wound VAC dressing is replaced with a black sponge.  Seal is in the green before she leaves the office.    [name] 08/19/2023, 9:43 PM   This note was created with Dragon medical transcription system.  Any errors from dictation are unintentional.

## 2023-08-20 ENCOUNTER — Encounter (INDEPENDENT_AMBULATORY_CARE_PROVIDER_SITE_OTHER): Payer: Self-pay

## 2023-08-20 ENCOUNTER — Ambulatory Visit (INDEPENDENT_AMBULATORY_CARE_PROVIDER_SITE_OTHER): Payer: 59 | Admitting: Vascular Surgery

## 2023-08-20 VITALS — BP 130/58 | HR 72 | Resp 16 | Wt 138.0 lb

## 2023-08-20 DIAGNOSIS — T827XXS Infection and inflammatory reaction due to other cardiac and vascular devices, implants and grafts, sequela: Secondary | ICD-10-CM

## 2023-08-20 DIAGNOSIS — T827XXD Infection and inflammatory reaction due to other cardiac and vascular devices, implants and grafts, subsequent encounter: Secondary | ICD-10-CM | POA: Diagnosis not present

## 2023-08-20 NOTE — Progress Notes (Signed)
Patient came in today for left groin wound vac change. Patient wound is healing well. Wound vac was place back on and patient will follow on Monday for next visit.  1. Vascular graft infection, sequela (Primary) The existing VAC is removed.  The wound is inspected.  The graft appears to be covered and there is epithelial ingrowth along the edges.  The wound is excellent in appearance with a large amount of granulation.   The wound measures 11 x 2.5 for 27.5 cm.  Not significantly different compared to the previous dressing change several days ago   A wound VAC dressing is replaced with a black sponge.  Seal is in the green before she leaves the office.

## 2023-08-21 ENCOUNTER — Other Ambulatory Visit (INDEPENDENT_AMBULATORY_CARE_PROVIDER_SITE_OTHER): Payer: 59

## 2023-08-21 ENCOUNTER — Other Ambulatory Visit: Payer: 59

## 2023-08-21 DIAGNOSIS — E871 Hypo-osmolality and hyponatremia: Secondary | ICD-10-CM | POA: Diagnosis not present

## 2023-08-21 DIAGNOSIS — D500 Iron deficiency anemia secondary to blood loss (chronic): Secondary | ICD-10-CM | POA: Diagnosis not present

## 2023-08-21 LAB — CBC WITH DIFFERENTIAL/PLATELET
Basophils Absolute: 0.1 10*3/uL (ref 0.0–0.1)
Basophils Relative: 1.3 % (ref 0.0–3.0)
Eosinophils Absolute: 0.6 10*3/uL (ref 0.0–0.7)
Eosinophils Relative: 5.9 % — ABNORMAL HIGH (ref 0.0–5.0)
HCT: 37.8 % (ref 36.0–46.0)
Hemoglobin: 12.4 g/dL (ref 12.0–15.0)
Lymphocytes Relative: 17.5 % (ref 12.0–46.0)
Lymphs Abs: 1.7 10*3/uL (ref 0.7–4.0)
MCHC: 32.8 g/dL (ref 30.0–36.0)
MCV: 84.3 fL (ref 78.0–100.0)
Monocytes Absolute: 0.7 10*3/uL (ref 0.1–1.0)
Monocytes Relative: 7.5 % (ref 3.0–12.0)
Neutro Abs: 6.5 10*3/uL (ref 1.4–7.7)
Neutrophils Relative %: 67.8 % (ref 43.0–77.0)
Platelets: 315 10*3/uL (ref 150.0–400.0)
RBC: 4.48 Mil/uL (ref 3.87–5.11)
RDW: 14.9 % (ref 11.5–15.5)
WBC: 9.6 10*3/uL (ref 4.0–10.5)

## 2023-08-21 LAB — COMPREHENSIVE METABOLIC PANEL
ALT: 29 U/L (ref 0–35)
AST: 26 U/L (ref 0–37)
Albumin: 3.8 g/dL (ref 3.5–5.2)
Alkaline Phosphatase: 91 U/L (ref 39–117)
BUN: 38 mg/dL — ABNORMAL HIGH (ref 6–23)
CO2: 28 meq/L (ref 19–32)
Calcium: 9 mg/dL (ref 8.4–10.5)
Chloride: 101 meq/L (ref 96–112)
Creatinine, Ser: 0.76 mg/dL (ref 0.40–1.20)
GFR: 82.51 mL/min (ref >60.00)
Glucose, Bld: 160 mg/dL — ABNORMAL HIGH (ref 70–99)
Potassium: 4.7 meq/L (ref 3.5–5.1)
Sodium: 136 meq/L (ref 135–145)
Total Bilirubin: 0.3 mg/dL (ref 0.2–1.2)
Total Protein: 7.3 g/dL (ref 6.0–8.3)

## 2023-08-22 ENCOUNTER — Encounter: Payer: Self-pay | Admitting: Internal Medicine

## 2023-08-22 ENCOUNTER — Encounter (INDEPENDENT_AMBULATORY_CARE_PROVIDER_SITE_OTHER): Payer: Self-pay | Admitting: Vascular Surgery

## 2023-08-22 LAB — IRON,TIBC AND FERRITIN PANEL
%SAT: 15 % — ABNORMAL LOW (ref 16–45)
Ferritin: 33 ng/mL (ref 16–288)
Iron: 46 ug/dL (ref 45–160)
TIBC: 308 ug/dL (ref 250–450)

## 2023-08-22 NOTE — Progress Notes (Signed)
Patient ID: Laura Reed, female   DOB: 05-Jun-1961, 63 y.o.   MRN: [id number]  No chief complaint on file.   HPI Laura Reed is a 63 y.o. female.  Patient returns for follow-up regarding her left groin wound and replacing the wound VAC    Past Medical History:  Diagnosis Date   Absence of kidney    left   Anxiety    Arthritis    Atherosclerosis of native arteries of extremities with intermittent claudication, bilateral legs (HCC)    Bladder cancer (HCC)    CHF (congestive heart failure) (HCC)    Complication of anesthesia    BP HAS  RUN LOW AFTER SURGERY-LUNGS FILLED UP WITH FLUID AFTER  LEG STENT SURGERY    Coronary artery disease    Diabetes mellitus    Family history of adverse reaction to anesthesia    Sister - PONV   GERD (gastroesophageal reflux disease)    OCC TUMS   Heart murmur    Hemorrhoid    History of methicillin resistant staphylococcus aureus (MRSA) 2007   Hypertension    Neuropathy    PVD (peripheral vascular disease) (HCC)    Thyroid nodule    right   Unspecified osteoarthritis, unspecified site    Urothelial carcinoma of kidney (HCC) 10/31/2014   INVASIVE UROTHELIAL CARCINOMA, LOW GRADE. T1, Nx.   Vitamin D deficiency, unspecified    Wears dentures    full upper and lower    Past Surgical History:  Procedure Laterality Date   AMPUTATION TOE     right (4th and 5th); left (great toe, 3rd)   AMPUTATION TOE Right 07/16/2018   Procedure: AMPUTATION TOE/MPJ right 2nd;  Surgeon: Linus Galas, DPM;  Location: [facility];  Service: Podiatry;  Laterality: Right;   APPLICATION OF WOUND VAC Left 06/26/2023   Procedure: WOUND VAC EXCHANGE LEFT GROIN;  Surgeon: Renford Dills, MD;  Location: [facility];  Service: Vascular;  Laterality: Left;   APPLICATION OF WOUND VAC Left 06/29/2023   Procedure: WOUND VAC EXCHANGE LEFT GROIN;  Surgeon: Renford Dills, MD;  Location: [facility];  Service: Vascular;  Laterality: Left;   APPLICATION OF WOUND VAC Left  07/02/2023   Procedure: WOUND VAC EXCHANGE LEFT GROIN;  Surgeon: Annice Needy, MD;  Location: [facility];  Service: Vascular;  Laterality: Left;   APPLICATION OF WOUND VAC Left 07/06/2023   Procedure: WOUND VAC EXCHANGE LEFT GROIN;  Surgeon: Renford Dills, MD;  Location: [facility];  Service: Vascular;  Laterality: Left;   APPLICATION OF WOUND VAC Left 07/16/2023   Procedure: WOUND VAC EXCHANGE LEFT GROIN;  Surgeon: Renford Dills, MD;  Location: [facility];  Service: Vascular;  Laterality: Left;   APPLICATION OF WOUND VAC Left 07/20/2023   Procedure: WOUND VAC EXCHANGE;  Surgeon: Renford Dills, MD;  Location: [facility];  Service: Vascular;  Laterality: Left;   APPLICATION OF WOUND VAC Left 07/23/2023   Procedure: WOUND VAC EXCHANGE;  Surgeon: Renford Dills, MD;  Location: [facility];  Service: Vascular;  Laterality: Left;   APPLICATION OF WOUND VAC Left 07/13/2023   Procedure: WOUND VAC EXCHANGE LEFT GROIN;  Surgeon: Renford Dills, MD;  Location: [facility];  Service: Vascular;  Laterality: Left;   APPLICATION OF WOUND VAC Left 07/09/2023   Procedure: WOUND VAC EXCHANGE OF LEFT GROIN;  Surgeon: Annice Needy, MD;  Location: [facility];  Service: Vascular;  Laterality: Left;   ARTERIAL BYPASS SURGRY  2009, 2013  x 2   right leg , done in [place]   CARDIAC CATHETERIZATION     CAROTID ENDARTERECTOMY Right 01/2014   Dr Gilda Crease   CATARACT EXTRACTION W/PHACO Right 12/14/2014   Procedure: CATARACT EXTRACTION PHACO AND INTRAOCULAR LENS PLACEMENT (IOC);  Surgeon: Lia Hopping, MD;  Location: [facility];  Service: Ophthalmology;  Laterality: Right;  [place]   00:38.6              AP        7.1                   CDE  2.76   CATARACT EXTRACTION W/PHACO Left 12/06/2019   Procedure: CATARACT EXTRACTION PHACO AND INTRAOCULAR LENS PLACEMENT (IOC) LEFT DIABETIC;  Surgeon: Galen Manila, MD;  Location: [facility];  Service: Ophthalmology;  Laterality: Left;  9.08 1:06.4   CESAREAN SECTION      CHOLECYSTECTOMY  03-03-12   Porcelain gallbladder, gallstones,  [place]   COLONOSCOPY W/ BIOPSIES  04/28/2012   Hyperplastic rectal polyps.   COLONOSCOPY WITH PROPOFOL N/A 04/02/2022   Procedure: COLONOSCOPY WITH PROPOFOL;  Surgeon: Earline Mayotte, MD;  Location: [facility];  Service: Endoscopy;  Laterality: N/A;   CORONARY ARTERY BYPASS GRAFT  2009   3 vessel   CYSTOSCOPY W/ RETROGRADES Right 09/01/2016   Procedure: CYSTOSCOPY WITH RETROGRADE PYELOGRAM;  Surgeon: Vanna Scotland, MD;  Location: [facility];  Service: Urology;  Laterality: Right;   CYSTOSCOPY W/ RETROGRADES Bilateral 03/19/2020   Procedure: CYSTOSCOPY WITH RETROGRADE PYELOGRAM;  Surgeon: Vanna Scotland, MD;  Location: [facility];  Service: Urology;  Laterality: Bilateral;   CYSTOSCOPY WITH BIOPSY N/A 03/19/2020   Procedure: CYSTOSCOPY WITH BIOPSY;  Surgeon: Vanna Scotland, MD;  Location: [facility];  Service: Urology;  Laterality: N/A;   EYE SURGERY     FEMORAL-POPLITEAL BYPASS GRAFT Left 04/29/2023   Procedure: BYPASS GRAFT FEMORAL-POPLITEAL ARTERY;  Surgeon: Renford Dills, MD;  Location: [facility];  Service: Vascular;  Laterality: Left;   HERNIA REPAIR  10-31-14   ventral, retro-rectus atrium mesh   INCISION AND DRAINAGE ABSCESS Left 06/24/2023   Procedure: INCISION AND DRAINAGE WITH SATORIUS FLAP;  Surgeon: Renford Dills, MD;  Location: [facility];  Service: Vascular;  Laterality: Left;   IRRIGATION AND DEBRIDEMENT FOOT Left 01/18/2019   Procedure: IRRIGATION AND DEBRIDEMENT FOOT;  Surgeon: Linus Galas, DPM;  Location: [facility];  Service: Podiatry;  Laterality: Left;   LOWER EXTREMITY ANGIOGRAPHY Left 12/10/2016   Procedure: Lower Extremity Angiography;  Surgeon: Renford Dills, MD;  Location: [facility];  Service: Cardiovascular;  Laterality: Left;   LOWER EXTREMITY ANGIOGRAPHY Left 02/02/2018   Procedure: LOWER EXTREMITY ANGIOGRAPHY;  Surgeon: Renford Dills, MD;  Location: [facility];   Service: Cardiovascular;  Laterality: Left;   LOWER EXTREMITY ANGIOGRAPHY Left 05/05/2018   Procedure: LOWER EXTREMITY ANGIOGRAPHY;  Surgeon: Renford Dills, MD;  Location: [facility];  Service: Cardiovascular;  Laterality: Left;   LOWER EXTREMITY ANGIOGRAPHY Left 12/04/2020   Procedure: LOWER EXTREMITY ANGIOGRAPHY with Intervention;  Surgeon: Renford Dills, MD;  Location: [facility];  Service: Cardiovascular;  Laterality: Left;   LOWER EXTREMITY ANGIOGRAPHY Left 04/24/2023   Procedure: Lower Extremity Angiography;  Surgeon: Renford Dills, MD;  Location: [facility];  Service: Cardiovascular;  Laterality: Left;   LOWER EXTREMITY ANGIOGRAPHY Left 05/01/2023   Procedure: Lower Extremity Angiography;  Surgeon: Renford Dills, MD;  Location: [facility];  Service:  Cardiovascular;  Laterality: Left;   NEPHRECTOMY Left 10-31-14   PERIPHERAL VASCULAR CATHETERIZATION Left 05/01/2015   Procedure: Lower Extremity Angiography;  Surgeon: Renford Dills, MD;  Location: [facility];  Service: Cardiovascular;  Laterality: Left;   PERIPHERAL VASCULAR CATHETERIZATION  05/01/2015   Procedure: Lower Extremity Intervention;  Surgeon: Renford Dills, MD;  Location: [facility];  Service: Cardiovascular;;   PERIPHERAL VASCULAR CATHETERIZATION Left 02/20/2015   Procedure: Pelvic Angiography;  Surgeon: Renford Dills, MD;  Location: [facility];  Service: Cardiovascular;  Laterality: Left;   TRANSMETATARSAL AMPUTATION Left 05/05/2023   Procedure: TRANSMETATARSAL AMPUTATION LEFT;  Surgeon: Gwyneth Revels, DPM;  Location: [facility];  Service: Orthopedics/Podiatry;  Laterality: Left;   TRANSURETHRAL RESECTION OF BLADDER TUMOR WITH MITOMYCIN-C N/A 09/01/2016   Procedure: TRANSURETHRAL RESECTION OF BLADDER TUMOR WITH MITOMYCIN-C;  Surgeon: Vanna Scotland, MD;  Location: [facility];  Service: Urology;  Laterality: N/A;      No Known  Allergies  Current Outpatient Medications  Medication Sig Dispense Refill   acetaminophen (TYLENOL) 500 MG tablet Take 1,000 mg by mouth every 6 (six) hours as needed for mild pain or headache.      acidophilus (RISAQUAD) CAPS capsule Take 2 capsules by mouth 2 (two) times daily.     amoxicillin-clavulanate (AUGMENTIN) 875-125 MG tablet Take 1 tablet by mouth every 12 (twelve) hours. 28 tablet 6   apixaban (ELIQUIS) 2.5 MG TABS tablet Take 1 tablet (2.5 mg total) by mouth 2 (two) times daily. 60 tablet 11   aspirin 81 MG chewable tablet Chew 81 mg by mouth at bedtime.      atorvastatin (LIPITOR) 20 MG tablet TAKE 1 TABLET(20 MG) BY MOUTH DAILY 90 tablet 3   Cholecalciferol (VITAMIN D3) 2000 UNITS TABS Take 2,000 Units by mouth daily after supper.      Continuous Glucose Sensor (FREESTYLE LIBRE 2 SENSOR) MISC Use to check sugar at least 4 times daily 2 each 2   empagliflozin (JARDIANCE) 25 MG TABS tablet TAKE 1 TABLET(25 MG) BY MOUTH DAILY BEFORE AND BREAKFAST 90 tablet 2   gabapentin (NEURONTIN) 300 MG capsule Take 300 mg by mouth 2 (two) times daily.     glucose blood test strip Use you check blood sugars up to four times daily. 100 each 12   insulin degludec (TRESIBA FLEXTOUCH) 100 UNIT/ML FlexTouch Pen Inject 15 Units into the skin daily. Reduced from 22 units daily.     insulin lispro (HUMALOG KWIKPEN) 100 UNIT/ML KwikPen ADMINISTER 5-10 UNITS UNDER THE SKIN THREE TIMES DAILY if you eat a meal.  5 units if blood sugar <200, 10 units if blood sugar >200. 15 mL 1   Insulin Pen Needle (PEN NEEDLES) 32G X 4 MM MISC Use to take insulin daily 100 each 3   Multiple Vitamin (MULTIVITAMIN) tablet Take 1 tablet by mouth daily.     polycarbophil (FIBERCON) 625 MG tablet Take 1 tablet (625 mg total) by mouth 2 (two) times daily. For stool bulking. 60 tablet 0   triamcinolone ointment (KENALOG) 0.5 % Apply 1 Application topically 2 (two) times daily. 80 g 0   Fe Fum-Vit C-Vit B12-FA (TRIGELS-F FORTE)  CAPS capsule Take 1 capsule by mouth 2 (two) times daily. (Patient not taking: Reported on 08/06/2023) 60 capsule 2   losartan (COZAAR) 50 MG tablet Take 50 mg by mouth daily. (Patient not taking: Reported on 08/06/2023)     oxyCODONE-acetaminophen (PERCOCET/ROXICET) 5-325 MG tablet Take 1 tablet by mouth daily  as needed (with wound vac exchanges). (Patient not taking: Reported on 08/06/2023) 15 tablet 0   No current facility-administered medications for this visit.        Physical Exam BP (!) 130/58   Pulse 72   Resp 16   Wt 138 lb (62.6 kg)   BMI 21.61 kg/m  Gen:  WD/WN, NAD Skin: Wound is clean and excellent in appearance     Assessment/Plan: 1. Vascular graft infection, sequela (Primary) The existing VAC is removed.  The wound is inspected.  The graft appears to be covered and there is epithelial ingrowth along the edges.  The wound is excellent in appearance with a large amount of granulation.   The wound measures 11 x 2.5 for 27.5 cm.  Not significantly different compared to the previous dressing change several days ago   A wound VAC dressing is replaced with a black sponge.  Seal is in the green before she leaves the office.      [name] 08/22/2023, 12:11 PM   This note was created with Dragon medical transcription system.  Any errors from dictation are unintentional.

## 2023-08-24 ENCOUNTER — Ambulatory Visit (INDEPENDENT_AMBULATORY_CARE_PROVIDER_SITE_OTHER): Payer: 59 | Admitting: Vascular Surgery

## 2023-08-24 ENCOUNTER — Encounter (INDEPENDENT_AMBULATORY_CARE_PROVIDER_SITE_OTHER): Payer: Self-pay

## 2023-08-24 ENCOUNTER — Other Ambulatory Visit: Payer: Self-pay | Admitting: Internal Medicine

## 2023-08-24 VITALS — BP 151/63 | HR 61 | Resp 16

## 2023-08-24 DIAGNOSIS — T827XXS Infection and inflammatory reaction due to other cardiac and vascular devices, implants and grafts, sequela: Secondary | ICD-10-CM | POA: Diagnosis not present

## 2023-08-24 DIAGNOSIS — Z794 Long term (current) use of insulin: Secondary | ICD-10-CM

## 2023-08-24 DIAGNOSIS — T827XXD Infection and inflammatory reaction due to other cardiac and vascular devices, implants and grafts, subsequent encounter: Secondary | ICD-10-CM | POA: Diagnosis not present

## 2023-08-24 MED ORDER — ATORVASTATIN CALCIUM 20 MG PO TABS: ORAL_TABLET | ORAL | 0 refills | Status: DC

## 2023-08-24 MED ORDER — EMPAGLIFLOZIN 25 MG PO TABS: ORAL_TABLET | ORAL | 0 refills | Status: DC

## 2023-08-24 NOTE — Telephone Encounter (Signed)
Copied from CRM [id number]. Topic: Clinical - Medication Refill >> Aug 24, 2023  9:42 AM Truddie Crumble wrote: Most Recent Primary Care Visit:  Provider: Sherlene Shams  Department: [facility]  Visit Type: HOSPITAL FOLLOW UP  Date: 08/06/2023  Medication: atorvastatin and JARDIANCE ([employer] stated the medicine is covered for 100 days) Has the patient contacted their pharmacy? No (Agent: If no, request that the patient contact the pharmacy for the refill. If patient does not wish to contact the pharmacy document the reason why and proceed with request.) (Agent: If yes, when and what did the pharmacy advise?)  Is this the correct pharmacy for this prescription? Yes If no, delete pharmacy and type the correct one.  This is the patient's preferred pharmacy:  [id number] [place], [place] - [address] AT [facility] OF [address] [place] [address] Phone: [phone] Fax: [phone]   Has the prescription been filled recently? No  Is the patient out of the medication? Yes  Has the patient been seen for an appointment in the last year OR does the patient have an upcoming appointment? Yes  Can we respond through MyChart? Yes  Agent: Please be advised that Rx refills may take up to 3 business days. We ask that you follow-up with your pharmacy.

## 2023-08-24 NOTE — Telephone Encounter (Signed)
 On my desk

## 2023-08-24 NOTE — Telephone Encounter (Signed)
Copied from CRM [id number]. Topic: Clinical - Medication Refill >> Aug 24, 2023  9:42 AM Truddie Crumble wrote: Most Recent Primary Care Visit:  Provider: Sherlene Shams  Department: [facility]  Visit Type: HOSPITAL FOLLOW UP  Date: 08/06/2023  Medication: atorvastatin and JARDIANCE ([employer] stated the medicine is covered for 100 days) Has the patient contacted their pharmacy? No (Agent: If no, request that the patient contact the pharmacy for the refill. If patient does not wish to contact the pharmacy document the reason why and proceed with request.) (Agent: If yes, when and what did the pharmacy advise?)  Is this the correct pharmacy for this prescription? Yes If no, delete pharmacy and type the correct one.  This is the patient's preferred pharmacy:  [id number] [place], [place] - [address] AT [facility] [address] [place] [address] Phone: [phone] Fax: [phone]   Has the prescription been filled recently? No  Is the patient out of the medication? Yes  Has the patient been seen for an appointment in the last year OR does the patient have an upcoming appointment?   Can we respond through MyChart?   Agent: Please be advised that Rx refills may take up to 3 business days. We ask that you follow-up with your pharmacy.

## 2023-08-24 NOTE — Progress Notes (Unsigned)
Patient ID: Laura Reed, female   DOB: Jun 14, 1961, 63 y.o.   MRN: [id number]   HPI Laura Reed is a 63 y.o. female.    Patient returns for follow-up wound VAC change and evaluation of the left groin wound   Past Medical History:  Diagnosis Date   Absence of kidney    left   Anxiety    Arthritis    Atherosclerosis of native arteries of extremities with intermittent claudication, bilateral legs (HCC)    Bladder cancer (HCC)    CHF (congestive heart failure) (HCC)    Complication of anesthesia    BP HAS  RUN LOW AFTER SURGERY-LUNGS FILLED UP WITH FLUID AFTER  LEG STENT SURGERY    Coronary artery disease    Diabetes mellitus    Family history of adverse reaction to anesthesia    Sister - PONV   GERD (gastroesophageal reflux disease)    OCC TUMS   Heart murmur    Hemorrhoid    History of methicillin resistant staphylococcus aureus (MRSA) 2007   Hypertension    Neuropathy    PVD (peripheral vascular disease) (HCC)    Thyroid nodule    right   Unspecified osteoarthritis, unspecified site    Urothelial carcinoma of kidney (HCC) 10/31/2014   INVASIVE UROTHELIAL CARCINOMA, LOW GRADE. T1, Nx.   Vitamin D deficiency, unspecified    Wears dentures    full upper and lower    Past Surgical History:  Procedure Laterality Date   AMPUTATION TOE     right (4th and 5th); left (great toe, 3rd)   AMPUTATION TOE Right 07/16/2018   Procedure: AMPUTATION TOE/MPJ right 2nd;  Surgeon: Linus Galas, DPM;  Location: [facility];  Service: Podiatry;  Laterality: Right;   APPLICATION OF WOUND VAC Left 06/26/2023   Procedure: WOUND VAC EXCHANGE LEFT GROIN;  Surgeon: Renford Dills, MD;  Location: [facility];  Service: Vascular;  Laterality: Left;   APPLICATION OF WOUND VAC Left 06/29/2023   Procedure: WOUND VAC EXCHANGE LEFT GROIN;  Surgeon: Renford Dills, MD;  Location: [facility];  Service: Vascular;  Laterality: Left;   APPLICATION OF WOUND VAC Left 07/02/2023   Procedure: WOUND  VAC EXCHANGE LEFT GROIN;  Surgeon: Annice Needy, MD;  Location: [facility];  Service: Vascular;  Laterality: Left;   APPLICATION OF WOUND VAC Left 07/06/2023   Procedure: WOUND VAC EXCHANGE LEFT GROIN;  Surgeon: Renford Dills, MD;  Location: [facility];  Service: Vascular;  Laterality: Left;   APPLICATION OF WOUND VAC Left 07/16/2023   Procedure: WOUND VAC EXCHANGE LEFT GROIN;  Surgeon: Renford Dills, MD;  Location: [facility];  Service: Vascular;  Laterality: Left;   APPLICATION OF WOUND VAC Left 07/20/2023   Procedure: WOUND VAC EXCHANGE;  Surgeon: Renford Dills, MD;  Location: [facility];  Service: Vascular;  Laterality: Left;   APPLICATION OF WOUND VAC Left 07/23/2023   Procedure: WOUND VAC EXCHANGE;  Surgeon: Renford Dills, MD;  Location: [facility];  Service: Vascular;  Laterality: Left;   APPLICATION OF WOUND VAC Left 07/13/2023   Procedure: WOUND VAC EXCHANGE LEFT GROIN;  Surgeon: Renford Dills, MD;  Location: [facility];  Service: Vascular;  Laterality: Left;   APPLICATION OF WOUND VAC Left 07/09/2023   Procedure: WOUND VAC EXCHANGE OF LEFT GROIN;  Surgeon: Annice Needy, MD;  Location: [facility];  Service: Vascular;  Laterality: Left;   ARTERIAL BYPASS SURGRY  2009, 2013 x 2   right  leg , done in [place]   CARDIAC CATHETERIZATION     CAROTID ENDARTERECTOMY Right 01/2014   Dr Laura Reed   CATARACT EXTRACTION W/PHACO Right 12/14/2014   Procedure: CATARACT EXTRACTION PHACO AND INTRAOCULAR LENS PLACEMENT (IOC);  Surgeon: Lia Hopping, MD;  Location: [facility];  Service: Ophthalmology;  Laterality: Right;  [place]   00:38.6              AP        7.1                   CDE  2.76   CATARACT EXTRACTION W/PHACO Left 12/06/2019   Procedure: CATARACT EXTRACTION PHACO AND INTRAOCULAR LENS PLACEMENT (IOC) LEFT DIABETIC;  Surgeon: Galen Manila, MD;  Location: [facility];  Service: Ophthalmology;  Laterality: Left;  9.08 1:06.4   CESAREAN SECTION     CHOLECYSTECTOMY  03-03-12   Porcelain  gallbladder, gallstones,  Byrnett   COLONOSCOPY W/ BIOPSIES  04/28/2012   Hyperplastic rectal polyps.   COLONOSCOPY WITH PROPOFOL N/A 04/02/2022   Procedure: COLONOSCOPY WITH PROPOFOL;  Surgeon: Earline Mayotte, MD;  Location: [facility];  Service: Endoscopy;  Laterality: N/A;   CORONARY ARTERY BYPASS GRAFT  2009   3 vessel   CYSTOSCOPY W/ RETROGRADES Right 09/01/2016   Procedure: CYSTOSCOPY WITH RETROGRADE PYELOGRAM;  Surgeon: Vanna Scotland, MD;  Location: [facility];  Service: Urology;  Laterality: Right;   CYSTOSCOPY W/ RETROGRADES Bilateral 03/19/2020   Procedure: CYSTOSCOPY WITH RETROGRADE PYELOGRAM;  Surgeon: Vanna Scotland, MD;  Location: [facility];  Service: Urology;  Laterality: Bilateral;   CYSTOSCOPY WITH BIOPSY N/A 03/19/2020   Procedure: CYSTOSCOPY WITH BIOPSY;  Surgeon: Vanna Scotland, MD;  Location: [facility];  Service: Urology;  Laterality: N/A;   EYE SURGERY     FEMORAL-POPLITEAL BYPASS GRAFT Left 04/29/2023   Procedure: BYPASS GRAFT FEMORAL-POPLITEAL ARTERY;  Surgeon: Renford Dills, MD;  Location: [facility];  Service: Vascular;  Laterality: Left;   HERNIA REPAIR  10-31-14   ventral, retro-rectus atrium mesh   INCISION AND DRAINAGE ABSCESS Left 06/24/2023   Procedure: INCISION AND DRAINAGE WITH SATORIUS FLAP;  Surgeon: Renford Dills, MD;  Location: [facility];  Service: Vascular;  Laterality: Left;   IRRIGATION AND DEBRIDEMENT FOOT Left 01/18/2019   Procedure: IRRIGATION AND DEBRIDEMENT FOOT;  Surgeon: Linus Galas, DPM;  Location: [facility];  Service: Podiatry;  Laterality: Left;   LOWER EXTREMITY ANGIOGRAPHY Left 12/10/2016   Procedure: Lower Extremity Angiography;  Surgeon: Renford Dills, MD;  Location: [facility];  Service: Cardiovascular;  Laterality: Left;   LOWER EXTREMITY ANGIOGRAPHY Left 02/02/2018   Procedure: LOWER EXTREMITY ANGIOGRAPHY;  Surgeon: Renford Dills, MD;  Location: [facility];  Service: Cardiovascular;  Laterality: Left;    LOWER EXTREMITY ANGIOGRAPHY Left 05/05/2018   Procedure: LOWER EXTREMITY ANGIOGRAPHY;  Surgeon: Renford Dills, MD;  Location: [facility];  Service: Cardiovascular;  Laterality: Left;   LOWER EXTREMITY ANGIOGRAPHY Left 12/04/2020   Procedure: LOWER EXTREMITY ANGIOGRAPHY with Intervention;  Surgeon: Renford Dills, MD;  Location: [facility];  Service: Cardiovascular;  Laterality: Left;   LOWER EXTREMITY ANGIOGRAPHY Left 04/24/2023   Procedure: Lower Extremity Angiography;  Surgeon: Renford Dills, MD;  Location: [facility];  Service: Cardiovascular;  Laterality: Left;   LOWER EXTREMITY ANGIOGRAPHY Left 05/01/2023   Procedure: Lower Extremity Angiography;  Surgeon: Renford Dills, MD;  Location: [facility];  Service: Cardiovascular;  Laterality: Left;  NEPHRECTOMY Left 10-31-14   PERIPHERAL VASCULAR CATHETERIZATION Left 05/01/2015   Procedure: Lower Extremity Angiography;  Surgeon: Renford Dills, MD;  Location: [facility];  Service: Cardiovascular;  Laterality: Left;   PERIPHERAL VASCULAR CATHETERIZATION  05/01/2015   Procedure: Lower Extremity Intervention;  Surgeon: Renford Dills, MD;  Location: [facility];  Service: Cardiovascular;;   PERIPHERAL VASCULAR CATHETERIZATION Left 02/20/2015   Procedure: Pelvic Angiography;  Surgeon: Renford Dills, MD;  Location: [facility];  Service: Cardiovascular;  Laterality: Left;   TRANSMETATARSAL AMPUTATION Left 05/05/2023   Procedure: TRANSMETATARSAL AMPUTATION LEFT;  Surgeon: Gwyneth Revels, DPM;  Location: [facility];  Service: Orthopedics/Podiatry;  Laterality: Left;   TRANSURETHRAL RESECTION OF BLADDER TUMOR WITH MITOMYCIN-C N/A 09/01/2016   Procedure: TRANSURETHRAL RESECTION OF BLADDER TUMOR WITH MITOMYCIN-C;  Surgeon: Vanna Scotland, MD;  Location: [facility];  Service: Urology;  Laterality: N/A;      No Known Allergies  Current Outpatient Medications   Medication Sig Dispense Refill   acetaminophen (TYLENOL) 500 MG tablet Take 1,000 mg by mouth every 6 (six) hours as needed for mild pain or headache.      acidophilus (RISAQUAD) CAPS capsule Take 2 capsules by mouth 2 (two) times daily.     amoxicillin-clavulanate (AUGMENTIN) 875-125 MG tablet Take 1 tablet by mouth every 12 (twelve) hours. 28 tablet 6   apixaban (ELIQUIS) 2.5 MG TABS tablet Take 1 tablet (2.5 mg total) by mouth 2 (two) times daily. 60 tablet 11   aspirin 81 MG chewable tablet Chew 81 mg by mouth at bedtime.      Cholecalciferol (VITAMIN D3) 2000 UNITS TABS Take 2,000 Units by mouth daily after supper.      Continuous Glucose Sensor (FREESTYLE LIBRE 2 SENSOR) MISC Use to check sugar at least 4 times daily 2 each 2   empagliflozin (JARDIANCE) 25 MG TABS tablet TAKE 1 TABLET(25 MG) BY MOUTH DAILY BEFORE AND BREAKFAST 30 tablet 0   gabapentin (NEURONTIN) 300 MG capsule Take 300 mg by mouth 2 (two) times daily.     glucose blood test strip Use you check blood sugars up to four times daily. 100 each 12   insulin degludec (TRESIBA FLEXTOUCH) 100 UNIT/ML FlexTouch Pen Inject 15 Units into the skin daily. Reduced from 22 units daily.     insulin lispro (HUMALOG KWIKPEN) 100 UNIT/ML KwikPen ADMINISTER 5-10 UNITS UNDER THE SKIN THREE TIMES DAILY if you eat a meal.  5 units if blood sugar <200, 10 units if blood sugar >200. 15 mL 1   Insulin Pen Needle (PEN NEEDLES) 32G X 4 MM MISC Use to take insulin daily 100 each 3   Multiple Vitamin (MULTIVITAMIN) tablet Take 1 tablet by mouth daily.     polycarbophil (FIBERCON) 625 MG tablet Take 1 tablet (625 mg total) by mouth 2 (two) times daily. For stool bulking. 60 tablet 0   triamcinolone ointment (KENALOG) 0.5 % Apply 1 Application topically 2 (two) times daily. 80 g 0   atorvastatin (LIPITOR) 20 MG tablet TAKE 1 TABLET(20 MG) BY MOUTH DAILY 90 tablet 1   Fe Fum-Vit C-Vit B12-FA (TRIGELS-F FORTE) CAPS capsule Take 1 capsule by mouth 2 (two)  times daily. (Patient not taking: Reported on 08/24/2023) 60 capsule 2   losartan (COZAAR) 50 MG tablet Take 50 mg by mouth daily. (Patient not taking: Reported on 08/06/2023)     oxyCODONE-acetaminophen (PERCOCET/ROXICET) 5-325 MG tablet Take 1 tablet by mouth daily as needed (with wound vac exchanges). (  Patient not taking: Reported on 08/24/2023) 15 tablet 0   No current facility-administered medications for this visit.        Physical Exam BP (!) 151/63   Pulse 61   Resp 16  Gen:  WD/WN, NAD Skin: The left groin is clean dry and well granulating    Assessment/Plan: 1. Vascular graft infection, sequela (Primary) The existing VAC is removed.  The wound is inspected.  The graft appears to be covered and there is epithelial ingrowth along the edges.  The wound is excellent in appearance with a large amount of granulation.   The wound measures 11 x 2.5 for 27.5 cm.  Not significantly different compared to the previous dressing change several days ago   A wound VAC dressing is replaced with a black sponge.  Seal is in the green before she leaves the office.      [name] 08/27/2023, 1:11 PM   This note was created with Dragon medical transcription system.  Any errors from dictation are unintentional.  Patient was seen today for left groin wound vac change. Patient wound is healing well. Patient vac was placed back on with adaptic. Patient will return Thursday for next wound vac change.

## 2023-08-25 ENCOUNTER — Encounter: Payer: Self-pay | Admitting: *Deleted

## 2023-08-25 ENCOUNTER — Other Ambulatory Visit: Payer: Self-pay

## 2023-08-25 MED ORDER — ATORVASTATIN CALCIUM 20 MG PO TABS: ORAL_TABLET | ORAL | 1 refills | Status: DC

## 2023-08-26 DIAGNOSIS — M79605 Pain in left leg: Secondary | ICD-10-CM | POA: Diagnosis not present

## 2023-08-26 DIAGNOSIS — Z7982 Long term (current) use of aspirin: Secondary | ICD-10-CM | POA: Diagnosis not present

## 2023-08-26 DIAGNOSIS — I2510 Atherosclerotic heart disease of native coronary artery without angina pectoris: Secondary | ICD-10-CM | POA: Diagnosis not present

## 2023-08-26 DIAGNOSIS — E785 Hyperlipidemia, unspecified: Secondary | ICD-10-CM | POA: Diagnosis not present

## 2023-08-26 DIAGNOSIS — Z905 Acquired absence of kidney: Secondary | ICD-10-CM | POA: Diagnosis not present

## 2023-08-26 DIAGNOSIS — Z794 Long term (current) use of insulin: Secondary | ICD-10-CM | POA: Diagnosis not present

## 2023-08-26 DIAGNOSIS — I110 Hypertensive heart disease with heart failure: Secondary | ICD-10-CM | POA: Diagnosis not present

## 2023-08-26 DIAGNOSIS — T8149XA Infection following a procedure, other surgical site, initial encounter: Secondary | ICD-10-CM | POA: Diagnosis not present

## 2023-08-26 DIAGNOSIS — E1142 Type 2 diabetes mellitus with diabetic polyneuropathy: Secondary | ICD-10-CM | POA: Diagnosis not present

## 2023-08-26 DIAGNOSIS — Z89422 Acquired absence of other left toe(s): Secondary | ICD-10-CM | POA: Diagnosis not present

## 2023-08-26 DIAGNOSIS — D72829 Elevated white blood cell count, unspecified: Secondary | ICD-10-CM | POA: Diagnosis not present

## 2023-08-26 DIAGNOSIS — L02214 Cutaneous abscess of groin: Secondary | ICD-10-CM | POA: Diagnosis not present

## 2023-08-26 DIAGNOSIS — E871 Hypo-osmolality and hyponatremia: Secondary | ICD-10-CM | POA: Diagnosis not present

## 2023-08-26 DIAGNOSIS — D649 Anemia, unspecified: Secondary | ICD-10-CM | POA: Diagnosis not present

## 2023-08-26 DIAGNOSIS — E1151 Type 2 diabetes mellitus with diabetic peripheral angiopathy without gangrene: Secondary | ICD-10-CM | POA: Diagnosis not present

## 2023-08-26 DIAGNOSIS — I5032 Chronic diastolic (congestive) heart failure: Secondary | ICD-10-CM | POA: Diagnosis not present

## 2023-08-26 DIAGNOSIS — Z87891 Personal history of nicotine dependence: Secondary | ICD-10-CM | POA: Diagnosis not present

## 2023-08-26 DIAGNOSIS — Z9181 History of falling: Secondary | ICD-10-CM | POA: Diagnosis not present

## 2023-08-26 DIAGNOSIS — Z8551 Personal history of malignant neoplasm of bladder: Secondary | ICD-10-CM | POA: Diagnosis not present

## 2023-08-26 DIAGNOSIS — Z85528 Personal history of other malignant neoplasm of kidney: Secondary | ICD-10-CM | POA: Diagnosis not present

## 2023-08-26 DIAGNOSIS — R197 Diarrhea, unspecified: Secondary | ICD-10-CM | POA: Diagnosis not present

## 2023-08-26 DIAGNOSIS — E1169 Type 2 diabetes mellitus with other specified complication: Secondary | ICD-10-CM | POA: Diagnosis not present

## 2023-08-26 DIAGNOSIS — I6529 Occlusion and stenosis of unspecified carotid artery: Secondary | ICD-10-CM | POA: Diagnosis not present

## 2023-08-27 ENCOUNTER — Encounter (INDEPENDENT_AMBULATORY_CARE_PROVIDER_SITE_OTHER): Payer: Self-pay | Admitting: Vascular Surgery

## 2023-08-27 ENCOUNTER — Encounter (INDEPENDENT_AMBULATORY_CARE_PROVIDER_SITE_OTHER): Payer: Self-pay

## 2023-08-27 ENCOUNTER — Ambulatory Visit (INDEPENDENT_AMBULATORY_CARE_PROVIDER_SITE_OTHER): Payer: 59 | Admitting: Vascular Surgery

## 2023-08-27 VITALS — BP 151/69 | HR 69 | Resp 16 | Wt 146.0 lb

## 2023-08-27 DIAGNOSIS — T827XXS Infection and inflammatory reaction due to other cardiac and vascular devices, implants and grafts, sequela: Secondary | ICD-10-CM

## 2023-08-27 DIAGNOSIS — T827XXD Infection and inflammatory reaction due to other cardiac and vascular devices, implants and grafts, subsequent encounter: Secondary | ICD-10-CM

## 2023-08-27 NOTE — Progress Notes (Signed)
Patient ID: Laura Reed, female   DOB: 09-20-1960, 63 y.o.   MRN: [id number]  No chief complaint on file.   HPI Laura Reed is a 63 y.o. female.    Patient returns for follow-up wound care and VAC dressing change.  She reports that the [facility] has been alarming quite a bit this time beginning a day or 2 ago.  No fever or chills no significant change in drainage.   Past Medical History:  Diagnosis Date   Absence of kidney    left   Anxiety    Arthritis    Atherosclerosis of native arteries of extremities with intermittent claudication, bilateral legs (HCC)    Bladder cancer (HCC)    CHF (congestive heart failure) (HCC)    Complication of anesthesia    BP HAS  RUN LOW AFTER SURGERY-LUNGS FILLED UP WITH FLUID AFTER  LEG STENT SURGERY    Coronary artery disease    Diabetes mellitus    Family history of adverse reaction to anesthesia    Sister - PONV   GERD (gastroesophageal reflux disease)    OCC TUMS   Heart murmur    Hemorrhoid    History of methicillin resistant staphylococcus aureus (MRSA) 2007   Hypertension    Neuropathy    PVD (peripheral vascular disease) (HCC)    Thyroid nodule    right   Unspecified osteoarthritis, unspecified site    Urothelial carcinoma of kidney (HCC) 10/31/2014   INVASIVE UROTHELIAL CARCINOMA, LOW GRADE. T1, Nx.   Vitamin D deficiency, unspecified    Wears dentures    full upper and lower    Past Surgical History:  Procedure Laterality Date   AMPUTATION TOE     right (4th and 5th); left (great toe, 3rd)   AMPUTATION TOE Right 07/16/2018   Procedure: AMPUTATION TOE/MPJ right 2nd;  Surgeon: Linus Galas, DPM;  Location: [facility];  Service: Podiatry;  Laterality: Right;   APPLICATION OF WOUND VAC Left 06/26/2023   Procedure: WOUND VAC EXCHANGE LEFT GROIN;  Surgeon: Renford Dills, MD;  Location: [facility];  Service: Vascular;  Laterality: Left;   APPLICATION OF WOUND VAC Left 06/29/2023   Procedure: WOUND VAC EXCHANGE LEFT GROIN;   Surgeon: Renford Dills, MD;  Location: [facility];  Service: Vascular;  Laterality: Left;   APPLICATION OF WOUND VAC Left 07/02/2023   Procedure: WOUND VAC EXCHANGE LEFT GROIN;  Surgeon: Annice Needy, MD;  Location: [facility];  Service: Vascular;  Laterality: Left;   APPLICATION OF WOUND VAC Left 07/06/2023   Procedure: WOUND VAC EXCHANGE LEFT GROIN;  Surgeon: Renford Dills, MD;  Location: [facility];  Service: Vascular;  Laterality: Left;   APPLICATION OF WOUND VAC Left 07/16/2023   Procedure: WOUND VAC EXCHANGE LEFT GROIN;  Surgeon: Renford Dills, MD;  Location: [facility];  Service: Vascular;  Laterality: Left;   APPLICATION OF WOUND VAC Left 07/20/2023   Procedure: WOUND VAC EXCHANGE;  Surgeon: Renford Dills, MD;  Location: [facility];  Service: Vascular;  Laterality: Left;   APPLICATION OF WOUND VAC Left 07/23/2023   Procedure: WOUND VAC EXCHANGE;  Surgeon: Renford Dills, MD;  Location: [facility];  Service: Vascular;  Laterality: Left;   APPLICATION OF WOUND VAC Left 07/13/2023   Procedure: WOUND VAC EXCHANGE LEFT GROIN;  Surgeon: Renford Dills, MD;  Location: [facility];  Service: Vascular;  Laterality: Left;   APPLICATION OF WOUND VAC Left 07/09/2023   Procedure: WOUND VAC EXCHANGE  OF LEFT GROIN;  Surgeon: Annice Needy, MD;  Location: [facility];  Service: Vascular;  Laterality: Left;   ARTERIAL BYPASS SURGRY  2009, 2013 x 2   right leg , done in [place]   CARDIAC CATHETERIZATION     CAROTID ENDARTERECTOMY Right 01/2014   Dr Gilda Crease   CATARACT EXTRACTION W/PHACO Right 12/14/2014   Procedure: CATARACT EXTRACTION PHACO AND INTRAOCULAR LENS PLACEMENT (IOC);  Surgeon: Lia Hopping, MD;  Location: [facility];  Service: Ophthalmology;  Laterality: Right;  [place]   00:38.6              AP        7.1                   CDE  2.76   CATARACT EXTRACTION W/PHACO Left 12/06/2019   Procedure: CATARACT EXTRACTION PHACO AND INTRAOCULAR LENS PLACEMENT (IOC) LEFT DIABETIC;  Surgeon: Galen Manila,  MD;  Location: [facility];  Service: Ophthalmology;  Laterality: Left;  9.08 1:06.4   CESAREAN SECTION     CHOLECYSTECTOMY  03-03-12   Porcelain gallbladder, gallstones,  Byrnett   COLONOSCOPY W/ BIOPSIES  04/28/2012   Hyperplastic rectal polyps.   COLONOSCOPY WITH PROPOFOL N/A 04/02/2022   Procedure: COLONOSCOPY WITH PROPOFOL;  Surgeon: Earline Mayotte, MD;  Location: [facility];  Service: Endoscopy;  Laterality: N/A;   CORONARY ARTERY BYPASS GRAFT  2009   3 vessel   CYSTOSCOPY W/ RETROGRADES Right 09/01/2016   Procedure: CYSTOSCOPY WITH RETROGRADE PYELOGRAM;  Surgeon: Vanna Scotland, MD;  Location: [facility];  Service: Urology;  Laterality: Right;   CYSTOSCOPY W/ RETROGRADES Bilateral 03/19/2020   Procedure: CYSTOSCOPY WITH RETROGRADE PYELOGRAM;  Surgeon: Vanna Scotland, MD;  Location: [facility];  Service: Urology;  Laterality: Bilateral;   CYSTOSCOPY WITH BIOPSY N/A 03/19/2020   Procedure: CYSTOSCOPY WITH BIOPSY;  Surgeon: Vanna Scotland, MD;  Location: [facility];  Service: Urology;  Laterality: N/A;   EYE SURGERY     FEMORAL-POPLITEAL BYPASS GRAFT Left 04/29/2023   Procedure: BYPASS GRAFT FEMORAL-POPLITEAL ARTERY;  Surgeon: Renford Dills, MD;  Location: [facility];  Service: Vascular;  Laterality: Left;   HERNIA REPAIR  10-31-14   ventral, retro-rectus atrium mesh   INCISION AND DRAINAGE ABSCESS Left 06/24/2023   Procedure: INCISION AND DRAINAGE WITH SATORIUS FLAP;  Surgeon: Renford Dills, MD;  Location: [facility];  Service: Vascular;  Laterality: Left;   IRRIGATION AND DEBRIDEMENT FOOT Left 01/18/2019   Procedure: IRRIGATION AND DEBRIDEMENT FOOT;  Surgeon: Linus Galas, DPM;  Location: [facility];  Service: Podiatry;  Laterality: Left;   LOWER EXTREMITY ANGIOGRAPHY Left 12/10/2016   Procedure: Lower Extremity Angiography;  Surgeon: Renford Dills, MD;  Location: [facility];  Service: Cardiovascular;  Laterality: Left;   LOWER EXTREMITY ANGIOGRAPHY Left  02/02/2018   Procedure: LOWER EXTREMITY ANGIOGRAPHY;  Surgeon: Renford Dills, MD;  Location: [facility];  Service: Cardiovascular;  Laterality: Left;   LOWER EXTREMITY ANGIOGRAPHY Left 05/05/2018   Procedure: LOWER EXTREMITY ANGIOGRAPHY;  Surgeon: Renford Dills, MD;  Location: [facility];  Service: Cardiovascular;  Laterality: Left;   LOWER EXTREMITY ANGIOGRAPHY Left 12/04/2020   Procedure: LOWER EXTREMITY ANGIOGRAPHY with Intervention;  Surgeon: Renford Dills, MD;  Location: [facility];  Service: Cardiovascular;  Laterality: Left;   LOWER EXTREMITY ANGIOGRAPHY Left 04/24/2023   Procedure: Lower Extremity Angiography;  Surgeon: Renford Dills, MD;  Location: [facility];  Service: Cardiovascular;  Laterality: Left;  LOWER EXTREMITY ANGIOGRAPHY Left 05/01/2023   Procedure: Lower Extremity Angiography;  Surgeon: Renford Dills, MD;  Location: [facility];  Service: Cardiovascular;  Laterality: Left;   NEPHRECTOMY Left 10-31-14   PERIPHERAL VASCULAR CATHETERIZATION Left 05/01/2015   Procedure: Lower Extremity Angiography;  Surgeon: Renford Dills, MD;  Location: [facility];  Service: Cardiovascular;  Laterality: Left;   PERIPHERAL VASCULAR CATHETERIZATION  05/01/2015   Procedure: Lower Extremity Intervention;  Surgeon: Renford Dills, MD;  Location: [facility];  Service: Cardiovascular;;   PERIPHERAL VASCULAR CATHETERIZATION Left 02/20/2015   Procedure: Pelvic Angiography;  Surgeon: Renford Dills, MD;  Location: [facility];  Service: Cardiovascular;  Laterality: Left;   TRANSMETATARSAL AMPUTATION Left 05/05/2023   Procedure: TRANSMETATARSAL AMPUTATION LEFT;  Surgeon: Gwyneth Revels, DPM;  Location: [facility];  Service: Orthopedics/Podiatry;  Laterality: Left;   TRANSURETHRAL RESECTION OF BLADDER TUMOR WITH MITOMYCIN-C N/A 09/01/2016   Procedure: TRANSURETHRAL RESECTION OF BLADDER TUMOR WITH  MITOMYCIN-C;  Surgeon: Vanna Scotland, MD;  Location: [facility];  Service: Urology;  Laterality: N/A;      No Known Allergies  Current Outpatient Medications  Medication Sig Dispense Refill   acetaminophen (TYLENOL) 500 MG tablet Take 1,000 mg by mouth every 6 (six) hours as needed for mild pain or headache.      acidophilus (RISAQUAD) CAPS capsule Take 2 capsules by mouth 2 (two) times daily.     amoxicillin-clavulanate (AUGMENTIN) 875-125 MG tablet Take 1 tablet by mouth every 12 (twelve) hours. 28 tablet 6   apixaban (ELIQUIS) 2.5 MG TABS tablet Take 1 tablet (2.5 mg total) by mouth 2 (two) times daily. 60 tablet 11   aspirin 81 MG chewable tablet Chew 81 mg by mouth at bedtime.      atorvastatin (LIPITOR) 20 MG tablet TAKE 1 TABLET(20 MG) BY MOUTH DAILY 90 tablet 1   Cholecalciferol (VITAMIN D3) 2000 UNITS TABS Take 2,000 Units by mouth daily after supper.      Continuous Glucose Sensor (FREESTYLE LIBRE 2 SENSOR) MISC Use to check sugar at least 4 times daily 2 each 2   empagliflozin (JARDIANCE) 25 MG TABS tablet TAKE 1 TABLET(25 MG) BY MOUTH DAILY BEFORE AND BREAKFAST 30 tablet 0   gabapentin (NEURONTIN) 300 MG capsule Take 300 mg by mouth 2 (two) times daily.     glucose blood test strip Use you check blood sugars up to four times daily. 100 each 12   insulin degludec (TRESIBA FLEXTOUCH) 100 UNIT/ML FlexTouch Pen Inject 15 Units into the skin daily. Reduced from 22 units daily.     insulin lispro (HUMALOG KWIKPEN) 100 UNIT/ML KwikPen ADMINISTER 5-10 UNITS UNDER THE SKIN THREE TIMES DAILY if you eat a meal.  5 units if blood sugar <200, 10 units if blood sugar >200. 15 mL 1   Insulin Pen Needle (PEN NEEDLES) 32G X 4 MM MISC Use to take insulin daily 100 each 3   Multiple Vitamin (MULTIVITAMIN) tablet Take 1 tablet by mouth daily.     polycarbophil (FIBERCON) 625 MG tablet Take 1 tablet (625 mg total) by mouth 2 (two) times daily. For stool bulking. 60 tablet 0   triamcinolone ointment  (KENALOG) 0.5 % Apply 1 Application topically 2 (two) times daily. 80 g 0   Fe Fum-Vit C-Vit B12-FA (TRIGELS-F FORTE) CAPS capsule Take 1 capsule by mouth 2 (two) times daily. (Patient not taking: Reported on 08/06/2023) 60 capsule 2   losartan (COZAAR) 50 MG tablet Take 50  mg by mouth daily. (Patient not taking: Reported on 08/06/2023)     oxyCODONE-acetaminophen (PERCOCET/ROXICET) 5-325 MG tablet Take 1 tablet by mouth daily as needed (with wound vac exchanges). (Patient not taking: Reported on 08/06/2023) 15 tablet 0   No current facility-administered medications for this visit.        Physical Exam BP (!) 151/69   Pulse 69   Resp 16   Wt 146 lb (66.2 kg)   BMI 22.87 kg/m  Gen:  WD/WN, NAD Skin: VAC dressing is removed and the wound is healthy appearing and granulating.  It is getting smaller.     Assessment/Plan:  1. Vascular graft infection, sequela (Primary) The existing VAC is removed.  The wound is inspected.  The graft appears to be covered and there is epithelial ingrowth along the edges.  The wound is excellent in appearance with a large amount of granulation.   The wound measures 10 x 2.5 for 25.5 cm.  Decreased in size compared to the previous dressing change several days ago   A wound VAC dressing is replaced with a black sponge.  Seal is in the green before she leaves the office.     [name] 08/27/2023, 2:19 PM   This note was created with Dragon medical transcription system.  Any errors from dictation are unintentional.

## 2023-08-28 DIAGNOSIS — E113512 Type 2 diabetes mellitus with proliferative diabetic retinopathy with macular edema, left eye: Secondary | ICD-10-CM | POA: Diagnosis not present

## 2023-08-31 ENCOUNTER — Encounter (INDEPENDENT_AMBULATORY_CARE_PROVIDER_SITE_OTHER): Payer: Self-pay

## 2023-08-31 ENCOUNTER — Ambulatory Visit (INDEPENDENT_AMBULATORY_CARE_PROVIDER_SITE_OTHER): Payer: 59 | Admitting: Vascular Surgery

## 2023-08-31 VITALS — BP 129/59 | HR 69 | Resp 16 | Wt 145.8 lb

## 2023-08-31 DIAGNOSIS — T827XXD Infection and inflammatory reaction due to other cardiac and vascular devices, implants and grafts, subsequent encounter: Secondary | ICD-10-CM

## 2023-08-31 DIAGNOSIS — T827XXS Infection and inflammatory reaction due to other cardiac and vascular devices, implants and grafts, sequela: Secondary | ICD-10-CM | POA: Diagnosis not present

## 2023-09-01 ENCOUNTER — Encounter (INDEPENDENT_AMBULATORY_CARE_PROVIDER_SITE_OTHER): Payer: Self-pay | Admitting: Vascular Surgery

## 2023-09-01 NOTE — Progress Notes (Signed)
 Patient ID: Laura Reed, female   DOB: Jan 02, 1961, 63 y.o.   MRN: [id number]  No chief complaint on file.   HPI Laura Reed is a 63 y.o. female.    Patient returns for follow-up wound care and VAC dressing change. She reports that the [facility] has been good no alrams. No fever or chills no significant change in drainage.    Past Medical History:  Diagnosis Date   Absence of kidney    left   Anxiety    Arthritis    Atherosclerosis of native arteries of extremities with intermittent claudication, bilateral legs (HCC)    Bladder cancer (HCC)    CHF (congestive heart failure) (HCC)    Complication of anesthesia    BP HAS  RUN LOW AFTER SURGERY-LUNGS FILLED UP WITH FLUID AFTER  LEG STENT SURGERY    Coronary artery disease    Diabetes mellitus    Family history of adverse reaction to anesthesia    Sister - PONV   GERD (gastroesophageal reflux disease)    OCC TUMS   Heart murmur    Hemorrhoid    History of methicillin resistant staphylococcus aureus (MRSA) 2007   Hypertension    Neuropathy    PVD (peripheral vascular disease) (HCC)    Thyroid nodule    right   Unspecified osteoarthritis, unspecified site    Urothelial carcinoma of kidney (HCC) 10/31/2014   INVASIVE UROTHELIAL CARCINOMA, LOW GRADE. T1, Nx.   Vitamin D deficiency, unspecified    Wears dentures    full upper and lower    Past Surgical History:  Procedure Laterality Date   AMPUTATION TOE     right (4th and 5th); left (great toe, 3rd)   AMPUTATION TOE Right 07/16/2018   Procedure: AMPUTATION TOE/MPJ right 2nd;  Surgeon: Linus Galas, DPM;  Location: [facility];  Service: Podiatry;  Laterality: Right;   APPLICATION OF WOUND VAC Left 06/26/2023   Procedure: WOUND VAC EXCHANGE LEFT GROIN;  Surgeon: Renford Dills, MD;  Location: [facility];  Service: Vascular;  Laterality: Left;   APPLICATION OF WOUND VAC Left 06/29/2023   Procedure: WOUND VAC EXCHANGE LEFT GROIN;  Surgeon: Renford Dills, MD;   Location: [facility];  Service: Vascular;  Laterality: Left;   APPLICATION OF WOUND VAC Left 07/02/2023   Procedure: WOUND VAC EXCHANGE LEFT GROIN;  Surgeon: Annice Needy, MD;  Location: [facility];  Service: Vascular;  Laterality: Left;   APPLICATION OF WOUND VAC Left 07/06/2023   Procedure: WOUND VAC EXCHANGE LEFT GROIN;  Surgeon: Renford Dills, MD;  Location: [facility];  Service: Vascular;  Laterality: Left;   APPLICATION OF WOUND VAC Left 07/16/2023   Procedure: WOUND VAC EXCHANGE LEFT GROIN;  Surgeon: Renford Dills, MD;  Location: [facility];  Service: Vascular;  Laterality: Left;   APPLICATION OF WOUND VAC Left 07/20/2023   Procedure: WOUND VAC EXCHANGE;  Surgeon: Renford Dills, MD;  Location: [facility];  Service: Vascular;  Laterality: Left;   APPLICATION OF WOUND VAC Left 07/23/2023   Procedure: WOUND VAC EXCHANGE;  Surgeon: Renford Dills, MD;  Location: [facility];  Service: Vascular;  Laterality: Left;   APPLICATION OF WOUND VAC Left 07/13/2023   Procedure: WOUND VAC EXCHANGE LEFT GROIN;  Surgeon: Renford Dills, MD;  Location: [facility];  Service: Vascular;  Laterality: Left;   APPLICATION OF WOUND VAC Left 07/09/2023   Procedure: WOUND VAC EXCHANGE OF LEFT GROIN;  Surgeon: Annice Needy, MD;  Location: [facility];  Service: Vascular;  Laterality: Left;   ARTERIAL BYPASS SURGRY  2009, 2013 x 2   right leg , done in [place]   CARDIAC CATHETERIZATION     CAROTID ENDARTERECTOMY Right 01/2014   Dr Gilda Crease   CATARACT EXTRACTION W/PHACO Right 12/14/2014   Procedure: CATARACT EXTRACTION PHACO AND INTRAOCULAR LENS PLACEMENT (IOC);  Surgeon: Lia Hopping, MD;  Location: [facility];  Service: Ophthalmology;  Laterality: Right;  [place]   00:38.6              AP        7.1                   CDE  2.76   CATARACT EXTRACTION W/PHACO Left 12/06/2019   Procedure: CATARACT EXTRACTION PHACO AND INTRAOCULAR LENS PLACEMENT (IOC) LEFT DIABETIC;  Surgeon: Galen Manila, MD;  Location: [facility];   Service: Ophthalmology;  Laterality: Left;  9.08 1:06.4   CESAREAN SECTION     CHOLECYSTECTOMY  03-03-12   Porcelain gallbladder, gallstones,  [place]   COLONOSCOPY W/ BIOPSIES  04/28/2012   Hyperplastic rectal polyps.   COLONOSCOPY WITH PROPOFOL N/A 04/02/2022   Procedure: COLONOSCOPY WITH PROPOFOL;  Surgeon: Earline Mayotte, MD;  Location: [facility];  Service: Endoscopy;  Laterality: N/A;   CORONARY ARTERY BYPASS GRAFT  2009   3 vessel   CYSTOSCOPY W/ RETROGRADES Right 09/01/2016   Procedure: CYSTOSCOPY WITH RETROGRADE PYELOGRAM;  Surgeon: Vanna Scotland, MD;  Location: [facility];  Service: Urology;  Laterality: Right;   CYSTOSCOPY W/ RETROGRADES Bilateral 03/19/2020   Procedure: CYSTOSCOPY WITH RETROGRADE PYELOGRAM;  Surgeon: Vanna Scotland, MD;  Location: [facility];  Service: Urology;  Laterality: Bilateral;   CYSTOSCOPY WITH BIOPSY N/A 03/19/2020   Procedure: CYSTOSCOPY WITH BIOPSY;  Surgeon: Vanna Scotland, MD;  Location: [facility];  Service: Urology;  Laterality: N/A;   EYE SURGERY     FEMORAL-POPLITEAL BYPASS GRAFT Left 04/29/2023   Procedure: BYPASS GRAFT FEMORAL-POPLITEAL ARTERY;  Surgeon: Renford Dills, MD;  Location: [facility];  Service: Vascular;  Laterality: Left;   HERNIA REPAIR  10-31-14   ventral, retro-rectus atrium mesh   INCISION AND DRAINAGE ABSCESS Left 06/24/2023   Procedure: INCISION AND DRAINAGE WITH SATORIUS FLAP;  Surgeon: Renford Dills, MD;  Location: [facility];  Service: Vascular;  Laterality: Left;   IRRIGATION AND DEBRIDEMENT FOOT Left 01/18/2019   Procedure: IRRIGATION AND DEBRIDEMENT FOOT;  Surgeon: Linus Galas, DPM;  Location: [facility];  Service: Podiatry;  Laterality: Left;   LOWER EXTREMITY ANGIOGRAPHY Left 12/10/2016   Procedure: Lower Extremity Angiography;  Surgeon: Renford Dills, MD;  Location: [facility];  Service: Cardiovascular;  Laterality: Left;   LOWER EXTREMITY ANGIOGRAPHY Left 02/02/2018   Procedure: LOWER EXTREMITY  ANGIOGRAPHY;  Surgeon: Renford Dills, MD;  Location: [facility];  Service: Cardiovascular;  Laterality: Left;   LOWER EXTREMITY ANGIOGRAPHY Left 05/05/2018   Procedure: LOWER EXTREMITY ANGIOGRAPHY;  Surgeon: Renford Dills, MD;  Location: [facility];  Service: Cardiovascular;  Laterality: Left;   LOWER EXTREMITY ANGIOGRAPHY Left 12/04/2020   Procedure: LOWER EXTREMITY ANGIOGRAPHY with Intervention;  Surgeon: Renford Dills, MD;  Location: [facility];  Service: Cardiovascular;  Laterality: Left;   LOWER EXTREMITY ANGIOGRAPHY Left 04/24/2023   Procedure: Lower Extremity Angiography;  Surgeon: Renford Dills, MD;  Location: [facility];  Service: Cardiovascular;  Laterality: Left;   LOWER EXTREMITY ANGIOGRAPHY Left 05/01/2023   Procedure:  Lower Extremity Angiography;  Surgeon: Renford Dills, MD;  Location: [facility];  Service: Cardiovascular;  Laterality: Left;   NEPHRECTOMY Left 10-31-14   PERIPHERAL VASCULAR CATHETERIZATION Left 05/01/2015   Procedure: Lower Extremity Angiography;  Surgeon: Renford Dills, MD;  Location: [facility];  Service: Cardiovascular;  Laterality: Left;   PERIPHERAL VASCULAR CATHETERIZATION  05/01/2015   Procedure: Lower Extremity Intervention;  Surgeon: Renford Dills, MD;  Location: [facility];  Service: Cardiovascular;;   PERIPHERAL VASCULAR CATHETERIZATION Left 02/20/2015   Procedure: Pelvic Angiography;  Surgeon: Renford Dills, MD;  Location: [facility];  Service: Cardiovascular;  Laterality: Left;   TRANSMETATARSAL AMPUTATION Left 05/05/2023   Procedure: TRANSMETATARSAL AMPUTATION LEFT;  Surgeon: Gwyneth Revels, DPM;  Location: [facility];  Service: Orthopedics/Podiatry;  Laterality: Left;   TRANSURETHRAL RESECTION OF BLADDER TUMOR WITH MITOMYCIN-C N/A 09/01/2016   Procedure: TRANSURETHRAL RESECTION OF BLADDER TUMOR WITH MITOMYCIN-C;  Surgeon: Vanna Scotland, MD;   Location: [facility];  Service: Urology;  Laterality: N/A;      No Known Allergies  Current Outpatient Medications  Medication Sig Dispense Refill   acetaminophen (TYLENOL) 500 MG tablet Take 1,000 mg by mouth every 6 (six) hours as needed for mild pain or headache.      acidophilus (RISAQUAD) CAPS capsule Take 2 capsules by mouth 2 (two) times daily.     amoxicillin-clavulanate (AUGMENTIN) 875-125 MG tablet Take 1 tablet by mouth every 12 (twelve) hours. 28 tablet 6   apixaban (ELIQUIS) 2.5 MG TABS tablet Take 1 tablet (2.5 mg total) by mouth 2 (two) times daily. 60 tablet 11   aspirin 81 MG chewable tablet Chew 81 mg by mouth at bedtime.      atorvastatin (LIPITOR) 20 MG tablet TAKE 1 TABLET(20 MG) BY MOUTH DAILY 90 tablet 1   Cholecalciferol (VITAMIN D3) 2000 UNITS TABS Take 2,000 Units by mouth daily after supper.      Continuous Glucose Sensor (FREESTYLE LIBRE 2 SENSOR) MISC Use to check sugar at least 4 times daily 2 each 2   empagliflozin (JARDIANCE) 25 MG TABS tablet TAKE 1 TABLET(25 MG) BY MOUTH DAILY BEFORE AND BREAKFAST 30 tablet 0   gabapentin (NEURONTIN) 300 MG capsule Take 300 mg by mouth 2 (two) times daily.     glucose blood test strip Use you check blood sugars up to four times daily. 100 each 12   insulin degludec (TRESIBA FLEXTOUCH) 100 UNIT/ML FlexTouch Pen Inject 15 Units into the skin daily. Reduced from 22 units daily.     insulin lispro (HUMALOG KWIKPEN) 100 UNIT/ML KwikPen ADMINISTER 5-10 UNITS UNDER THE SKIN THREE TIMES DAILY if you eat a meal.  5 units if blood sugar <200, 10 units if blood sugar >200. 15 mL 1   Insulin Pen Needle (PEN NEEDLES) 32G X 4 MM MISC Use to take insulin daily 100 each 3   Multiple Vitamin (MULTIVITAMIN) tablet Take 1 tablet by mouth daily.     polycarbophil (FIBERCON) 625 MG tablet Take 1 tablet (625 mg total) by mouth 2 (two) times daily. For stool bulking. 60 tablet 0   triamcinolone ointment (KENALOG) 0.5 % Apply 1 Application topically  2 (two) times daily. 80 g 0   Fe Fum-Vit C-Vit B12-FA (TRIGELS-F FORTE) CAPS capsule Take 1 capsule by mouth 2 (two) times daily. (Patient not taking: Reported on 08/31/2023) 60 capsule 2   losartan (COZAAR) 50 MG tablet Take 50 mg by mouth daily. (Patient not taking: Reported  on 08/06/2023)     oxyCODONE-acetaminophen (PERCOCET/ROXICET) 5-325 MG tablet Take 1 tablet by mouth daily as needed (with wound vac exchanges). (Patient not taking: Reported on 08/31/2023) 15 tablet 0   No current facility-administered medications for this visit.        Physical Exam BP (!) 129/59   Pulse 69   Resp 16   Wt 145 lb 12.8 oz (66.1 kg)   BMI 22.84 kg/m  Gen:  WD/WN, NAD Skin:  wound is well granulated and clean  measures 10 cm by 2 cm by 2-3 mm deep  20 sqr cm     Assessment/Plan: A VAC dressing replaced on the left groin wound.  Flexible intramedullary.  Good seal in the gradient was noted before the patient left      [name] 09/01/2023, 9:34 AM   This note was created with Dragon medical transcription system.  Any errors from dictation are unintentional.

## 2023-09-02 DIAGNOSIS — T8189XA Other complications of procedures, not elsewhere classified, initial encounter: Secondary | ICD-10-CM | POA: Diagnosis not present

## 2023-09-02 DIAGNOSIS — E785 Hyperlipidemia, unspecified: Secondary | ICD-10-CM | POA: Diagnosis not present

## 2023-09-02 DIAGNOSIS — I5032 Chronic diastolic (congestive) heart failure: Secondary | ICD-10-CM | POA: Diagnosis not present

## 2023-09-02 DIAGNOSIS — D72829 Elevated white blood cell count, unspecified: Secondary | ICD-10-CM | POA: Diagnosis not present

## 2023-09-02 DIAGNOSIS — S31104A Unspecified open wound of abdominal wall, left lower quadrant without penetration into peritoneal cavity, initial encounter: Secondary | ICD-10-CM | POA: Diagnosis not present

## 2023-09-02 DIAGNOSIS — Z89422 Acquired absence of other left toe(s): Secondary | ICD-10-CM | POA: Diagnosis not present

## 2023-09-02 DIAGNOSIS — E1151 Type 2 diabetes mellitus with diabetic peripheral angiopathy without gangrene: Secondary | ICD-10-CM | POA: Diagnosis not present

## 2023-09-02 DIAGNOSIS — Z8551 Personal history of malignant neoplasm of bladder: Secondary | ICD-10-CM | POA: Diagnosis not present

## 2023-09-02 DIAGNOSIS — Z87891 Personal history of nicotine dependence: Secondary | ICD-10-CM | POA: Diagnosis not present

## 2023-09-02 DIAGNOSIS — L02214 Cutaneous abscess of groin: Secondary | ICD-10-CM | POA: Diagnosis not present

## 2023-09-02 DIAGNOSIS — Z85528 Personal history of other malignant neoplasm of kidney: Secondary | ICD-10-CM | POA: Diagnosis not present

## 2023-09-02 DIAGNOSIS — Z905 Acquired absence of kidney: Secondary | ICD-10-CM | POA: Diagnosis not present

## 2023-09-02 DIAGNOSIS — Z794 Long term (current) use of insulin: Secondary | ICD-10-CM | POA: Diagnosis not present

## 2023-09-02 DIAGNOSIS — E1169 Type 2 diabetes mellitus with other specified complication: Secondary | ICD-10-CM | POA: Diagnosis not present

## 2023-09-02 DIAGNOSIS — T8149XA Infection following a procedure, other surgical site, initial encounter: Secondary | ICD-10-CM | POA: Diagnosis not present

## 2023-09-02 DIAGNOSIS — I110 Hypertensive heart disease with heart failure: Secondary | ICD-10-CM | POA: Diagnosis not present

## 2023-09-02 DIAGNOSIS — D649 Anemia, unspecified: Secondary | ICD-10-CM | POA: Diagnosis not present

## 2023-09-02 DIAGNOSIS — E1142 Type 2 diabetes mellitus with diabetic polyneuropathy: Secondary | ICD-10-CM | POA: Diagnosis not present

## 2023-09-02 DIAGNOSIS — M79605 Pain in left leg: Secondary | ICD-10-CM | POA: Diagnosis not present

## 2023-09-02 DIAGNOSIS — Z9181 History of falling: Secondary | ICD-10-CM | POA: Diagnosis not present

## 2023-09-02 DIAGNOSIS — I6529 Occlusion and stenosis of unspecified carotid artery: Secondary | ICD-10-CM | POA: Diagnosis not present

## 2023-09-02 DIAGNOSIS — Z7982 Long term (current) use of aspirin: Secondary | ICD-10-CM | POA: Diagnosis not present

## 2023-09-02 DIAGNOSIS — E871 Hypo-osmolality and hyponatremia: Secondary | ICD-10-CM | POA: Diagnosis not present

## 2023-09-02 DIAGNOSIS — I2510 Atherosclerotic heart disease of native coronary artery without angina pectoris: Secondary | ICD-10-CM | POA: Diagnosis not present

## 2023-09-02 DIAGNOSIS — R197 Diarrhea, unspecified: Secondary | ICD-10-CM | POA: Diagnosis not present

## 2023-09-03 ENCOUNTER — Ambulatory Visit (INDEPENDENT_AMBULATORY_CARE_PROVIDER_SITE_OTHER): Payer: 59 | Admitting: Vascular Surgery

## 2023-09-03 ENCOUNTER — Encounter (INDEPENDENT_AMBULATORY_CARE_PROVIDER_SITE_OTHER): Payer: Self-pay

## 2023-09-03 VITALS — BP 108/56 | HR 72 | Resp 16

## 2023-09-03 DIAGNOSIS — T827XXD Infection and inflammatory reaction due to other cardiac and vascular devices, implants and grafts, subsequent encounter: Secondary | ICD-10-CM

## 2023-09-03 DIAGNOSIS — T827XXS Infection and inflammatory reaction due to other cardiac and vascular devices, implants and grafts, sequela: Secondary | ICD-10-CM | POA: Diagnosis not present

## 2023-09-03 NOTE — Progress Notes (Signed)
 Patient seen today for left groin wound vac change. Patient incision has decrease in size. Patient wound vac was applied and patient will follow on Monday.

## 2023-09-04 ENCOUNTER — Ambulatory Visit (INDEPENDENT_AMBULATORY_CARE_PROVIDER_SITE_OTHER): Payer: 59 | Admitting: Internal Medicine

## 2023-09-04 VITALS — BP 130/68 | HR 73 | Temp 98.2°F | Ht 67.0 in | Wt 142.8 lb

## 2023-09-04 DIAGNOSIS — H60549 Acute eczematoid otitis externa, unspecified ear: Secondary | ICD-10-CM

## 2023-09-04 DIAGNOSIS — Z794 Long term (current) use of insulin: Secondary | ICD-10-CM | POA: Diagnosis not present

## 2023-09-04 DIAGNOSIS — E119 Type 2 diabetes mellitus without complications: Secondary | ICD-10-CM | POA: Diagnosis not present

## 2023-09-04 DIAGNOSIS — T827XXS Infection and inflammatory reaction due to other cardiac and vascular devices, implants and grafts, sequela: Secondary | ICD-10-CM

## 2023-09-04 MED ORDER — MOMETASONE FUROATE 0.1 % EX CREA: TOPICAL_CREAM | CUTANEOUS | 1 refills | Status: DC

## 2023-09-04 NOTE — Progress Notes (Signed)
 Subjective:  Patient ID: Laura Reed, female    DOB: [birth year]  Age: 63 y.o. MRN: [id number]  CC: The primary encounter diagnosis was Eczema of external auditory canal. Diagnoses of Insulin-requiring or dependent type II diabetes mellitus (HCC) and Vascular graft infection, sequela were also pertinent to this visit.   HPI Laura Reed presents for  Chief Complaint  Patient presents with   Medical Management of Chronic Issues   1) Type 2 DM:  patient is currently taking 18 units of Tresiba   humalog in the evening,  5 units for 150 ,  10 units for 200.I have downloaded and reviewed the data from patient's continuous blood glucose monitor. Patient's  sugars have been  IN RANGE  77   % OF THE TIME,   BELOW RANGE    % of the time.  And ABOVE RANGE  23 % OF THE TIME .    2) Hypertension: patient checks blood pressure twice weekly at home.  Readings have been for the most part <130/80 at rest . Patient is following a reduced salt diet most days and is taking medications as prescribed  Lab Results  Component Value Date   HGBA1C 6.9 (A) 08/06/2023     2) SEEING AVS 2/WEEK FOR WOUND VAC MANAGEMENT.  WOUND IS HEALING AND DC VAC IS ANTICIPATED FOR MONDAY.  STILL ON AUGMENTIN PER [name].  TAKING A PROBIOTIC   3) HTN:  CHECKING  BP AT HOME  MOST READINGS ARE 130/68 OR LOWER   Outpatient Medications Prior to Visit  Medication Sig Dispense Refill   acetaminophen (TYLENOL) 500 MG tablet Take 1,000 mg by mouth every 6 (six) hours as needed for mild pain or headache.      acidophilus (RISAQUAD) CAPS capsule Take 2 capsules by mouth 2 (two) times daily.     amoxicillin-clavulanate (AUGMENTIN) 875-125 MG tablet Take 1 tablet by mouth every 12 (twelve) hours. 28 tablet 6   apixaban (ELIQUIS) 2.5 MG TABS tablet Take 1 tablet (2.5 mg total) by mouth 2 (two) times daily. 60 tablet 11   aspirin 81 MG chewable tablet Chew 81 mg by mouth at bedtime.      atorvastatin (LIPITOR) 20 MG tablet TAKE 1  TABLET(20 MG) BY MOUTH DAILY 90 tablet 1   Cholecalciferol (VITAMIN D3) 2000 UNITS TABS Take 2,000 Units by mouth daily after supper.      co-enzyme Q-10 30 MG capsule Take 30 mg by mouth daily.     Continuous Glucose Sensor (FREESTYLE LIBRE 2 SENSOR) MISC Use to check sugar at least 4 times daily 2 each 2   empagliflozin (JARDIANCE) 25 MG TABS tablet TAKE 1 TABLET(25 MG) BY MOUTH DAILY BEFORE AND BREAKFAST 30 tablet 0   gabapentin (NEURONTIN) 300 MG capsule Take 300 mg by mouth 2 (two) times daily.     glucose blood test strip Use you check blood sugars up to four times daily. 100 each 12   insulin degludec (TRESIBA FLEXTOUCH) 100 UNIT/ML FlexTouch Pen Inject 15 Units into the skin daily. Reduced from 22 units daily.     insulin lispro (HUMALOG KWIKPEN) 100 UNIT/ML KwikPen ADMINISTER 5-10 UNITS UNDER THE SKIN THREE TIMES DAILY if you eat a meal.  5 units if blood sugar <200, 10 units if blood sugar >200. 15 mL 1   Insulin Pen Needle (PEN NEEDLES) 32G X 4 MM MISC Use to take insulin daily 100 each 3   Multiple Vitamin (MULTIVITAMIN) tablet Take 1 tablet by mouth  daily.     polycarbophil (FIBERCON) 625 MG tablet Take 1 tablet (625 mg total) by mouth 2 (two) times daily. For stool bulking. 60 tablet 0   triamcinolone ointment (KENALOG) 0.5 % Apply 1 Application topically 2 (two) times daily. 80 g 0   losartan (COZAAR) 50 MG tablet Take 50 mg by mouth daily. (Patient not taking: Reported on 08/06/2023)     oxyCODONE-acetaminophen (PERCOCET/ROXICET) 5-325 MG tablet Take 1 tablet by mouth daily as needed (with wound vac exchanges). (Patient not taking: Reported on 09/04/2023) 15 tablet 0   Fe Fum-Vit C-Vit B12-FA (TRIGELS-F FORTE) CAPS capsule Take 1 capsule by mouth 2 (two) times daily. (Patient not taking: Reported on 08/06/2023) 60 capsule 2   No facility-administered medications prior to visit.    Review of Systems;  Patient denies headache, fevers, malaise, unintentional weight loss, skin rash,  eye pain, sinus congestion and sinus pain, sore throat, dysphagia,  hemoptysis , cough, dyspnea, wheezing, chest pain, palpitations, orthopnea, edema, abdominal pain, nausea, melena, diarrhea, constipation, flank pain, dysuria, hematuria, urinary  Frequency, nocturia, numbness, tingling, seizures,  Focal weakness, Loss of consciousness,  Tremor, insomnia, depression, anxiety, and suicidal ideation.      Objective:  BP 130/68   Pulse 73   Temp 98.2 F (36.8 C) (Oral)   Ht 5\' 7"  (1.702 m)   Wt 142 lb 12.8 oz (64.8 kg)   SpO2 99%   BMI 22.37 kg/m   BP Readings from Last 3 Encounters:  09/04/23 130/68  09/03/23 (!) 108/56  08/31/23 (!) 129/59    Wt Readings from Last 3 Encounters:  09/04/23 142 lb 12.8 oz (64.8 kg)  08/31/23 145 lb 12.8 oz (66.1 kg)  08/27/23 146 lb (66.2 kg)    Physical Exam Vitals reviewed.  Constitutional:      General: She is not in acute distress.    Appearance: Normal appearance. She is normal weight. She is not ill-appearing, toxic-appearing or diaphoretic.  HENT:     Head: Normocephalic.     Left Ear: No PE tube.     Ears:     Comments: Erythema and flakiness of external canals bilaterally  Eyes:     General: No scleral icterus.       Right eye: No discharge.        Left eye: No discharge.     Conjunctiva/sclera: Conjunctivae normal.  Cardiovascular:     Rate and Rhythm: Normal rate and regular rhythm.     Heart sounds: Normal heart sounds.  Pulmonary:     Effort: Pulmonary effort is normal. No respiratory distress.     Breath sounds: Normal breath sounds.  Musculoskeletal:        General: Normal range of motion.  Skin:    General: Skin is warm and dry.  Neurological:     General: No focal deficit present.     Mental Status: She is alert and oriented to person, place, and time. Mental status is at baseline.  Psychiatric:        Mood and Affect: Mood normal.        Behavior: Behavior normal.        Thought Content: Thought content normal.         Judgment: Judgment normal.    Lab Results  Component Value Date   HGBA1C 6.9 (A) 08/06/2023   HGBA1C 5.8 05/19/2023   HGBA1C 8.0 (H) 01/27/2023    Lab Results  Component Value Date   CREATININE 0.76 08/21/2023   CREATININE  0.79 07/24/2023   CREATININE 0.87 07/20/2023    Lab Results  Component Value Date   WBC 9.6 08/21/2023   HGB 12.4 08/21/2023   HCT 37.8 08/21/2023   PLT 315.0 08/21/2023   GLUCOSE 160 (H) 08/21/2023   CHOL 85 01/27/2023   TRIG 65.0 01/27/2023   HDL 41.80 01/27/2023   LDLDIRECT 36.0 01/27/2023   LDLCALC 30 01/27/2023   ALT 29 08/21/2023   AST 26 08/21/2023   NA 136 08/21/2023   K 4.7 08/21/2023   CL 101 08/21/2023   CREATININE 0.76 08/21/2023   BUN 38 (H) 08/21/2023   CO2 28 08/21/2023   TSH 0.864 06/22/2023   INR 1.1 06/21/2023   HGBA1C 6.9 (A) 08/06/2023   MICROALBUR 5.6 (H) 09/08/2022    ECHOCARDIOGRAM COMPLETE Result Date: 06/25/2023    ECHOCARDIOGRAM REPORT   Patient Name:   SHARISSE RANTZ Date of Exam: 06/24/2023 Medical Rec #:  [id number]       Height:       67.0 in Accession #:    [id number]      Weight:       141.4 lb Date of Birth:  September 23, 1960       BSA:          1.745 m Patient Age:    62 years        BP:           107/74 mmHg Patient Gender: F               HR:           65 bpm. Exam Location:  [facility] Procedure: 2D Echo, Cardiac Doppler and Color Doppler Indications:     I48.91 Atrial Fibrillation.  History:         Patient has no prior history of Echocardiogram examinations.                  CHF, CAD, Signs/Symptoms:Murmur; Risk Factors:Hypertension and                  Diabetes.  Sonographer:     Daphine Deutscher RDCS Referring Phys:  [id number] Latina Craver [name] Diagnosing Phys: Julien Nordmann MD IMPRESSIONS  1. Left ventricular ejection fraction, by estimation, is 35 to 40%. The left ventricle has moderately decreased function. The left ventricle demonstrates global hypokinesis. The left ventricular internal cavity size was mildly  dilated. Left ventricular diastolic parameters are indeterminate.  2. Right ventricular systolic function is normal. The right ventricular size is normal. There is mildly elevated pulmonary artery systolic pressure. The estimated right ventricular systolic pressure is 42.5 mmHg.  3. The mitral valve is normal in structure. Mild to moderate mitral valve regurgitation. No evidence of mitral stenosis.  4. Tricuspid valve regurgitation is mild to moderate.  5. The aortic valve is calcified. There is moderate calcification of the aortic valve. Aortic valve regurgitation is not visualized. Aortic valve sclerosis/calcification is present, without any evidence of aortic stenosis.  6. The inferior vena cava is dilated in size with >50% respiratory variability, suggesting right atrial pressure of 8 mmHg. FINDINGS  Left Ventricle: Left ventricular ejection fraction, by estimation, is 35 to 40%. The left ventricle has moderately decreased function. The left ventricle demonstrates global hypokinesis. The left ventricular internal cavity size was mildly dilated. There is no left ventricular hypertrophy. Left ventricular diastolic parameters are indeterminate. Right Ventricle: The right ventricular size is normal. No increase in right ventricular wall thickness. Right ventricular systolic function is normal.  There is mildly elevated pulmonary artery systolic pressure. The tricuspid regurgitant velocity is 2.85  m/s, and with an assumed right atrial pressure of 10 mmHg, the estimated right ventricular systolic pressure is 42.5 mmHg. Left Atrium: Left atrial size was normal in size. Right Atrium: Right atrial size was normal in size. Pericardium: There is no evidence of pericardial effusion. Mitral Valve: The mitral valve is normal in structure. Mild to moderate mitral valve regurgitation. No evidence of mitral valve stenosis. Tricuspid Valve: The tricuspid valve is normal in structure. Tricuspid valve regurgitation is mild to  moderate. No evidence of tricuspid stenosis. Aortic Valve: The aortic valve is calcified. There is moderate calcification of the aortic valve. Aortic valve regurgitation is not visualized. Aortic valve sclerosis/calcification is present, without any evidence of aortic stenosis. Pulmonic Valve: The pulmonic valve was normal in structure. Pulmonic valve regurgitation is not visualized. No evidence of pulmonic stenosis. Aorta: The aortic root is normal in size and structure. Venous: The inferior vena cava is dilated in size with greater than 50% respiratory variability, suggesting right atrial pressure of 8 mmHg. IAS/Shunts: No atrial level shunt detected by color flow Doppler.  LEFT VENTRICLE PLAX 2D LVIDd:         5.90 cm   Diastology LVIDs:         4.60 cm   LV e' medial:    5.82 cm/s LV PW:         0.80 cm   LV E/e' medial:  25.1 LV IVS:        0.50 cm   LV e' lateral:   7.51 cm/s LVOT diam:     1.80 cm   LV E/e' lateral: 19.4 LV SV:         50 LV SV Index:   29 LVOT Area:     2.54 cm  RIGHT VENTRICLE            IVC RV Basal diam:  4.20 cm    IVC diam: 2.20 cm RV S prime:     8.98 cm/s TAPSE (M-mode): 2.4 cm LEFT ATRIUM             Index        RIGHT ATRIUM           Index LA diam:        4.10 cm 2.35 cm/m   RA Area:     13.20 cm LA Vol (A2C):   68.0 ml 38.96 ml/m  RA Volume:   29.20 ml  16.73 ml/m LA Vol (A4C):   47.6 ml 27.28 ml/m LA Biplane Vol: 58.8 ml 33.69 ml/m  AORTIC VALVE LVOT Vmax:   83.15 cm/s LVOT Vmean:  55.750 cm/s LVOT VTI:    0.196 m  AORTA Ao Root diam: 2.50 cm MITRAL VALVE                TRICUSPID VALVE MV Area (PHT): 3.68 cm     TR Peak grad:   32.5 mmHg MV Decel Time: 206 msec     TR Vmax:        285.00 cm/s MV E velocity: 146.00 cm/s MV A velocity: 79.00 cm/s   SHUNTS MV E/A ratio:  1.85         Systemic VTI:  0.20 m                             Systemic Diam: 1.80 cm Julien Nordmann MD Electronically  signed by Julien Nordmann MD Signature Date/Time: 06/25/2023/5:07:32 PM    Final      Assessment & Plan:  .Eczema of external auditory canal Assessment & Plan: Mometasone cream prescribed.   Orders: -     Mometasone Furoate; APPLY TWICE DAILY TO EAR CANAL AS NEEDED FOR ITCHING  Dispense: 15 g; Refill: 1  Insulin-requiring or dependent type II diabetes mellitus (HCC) Assessment & Plan: Advised to increase Tresiba to 20 units and increase the humalog sliding scale to 8 units for pre dinner CBG 150 or higher  12 units ofr CBG 200 or higher, and 14 units for CBF 240 or higher    Vascular graft infection, sequela Assessment & Plan: Pansensitivie E Coli and Enterococcus Faecalis by Oct 29 culture of wound drainage.  She required multiple debridements in the OR , prlonged IV abx,  and Wound Ac replacement.Marland Kitchen  wound is healing well per review of vascular surgery follow up       I spent 34 minutes on the day of this face to face encounter reviewing patient's  most recent visit with cardiology,  nephrology,  and neurology,  prior relevant surgical and non surgical procedures, recent  labs and imaging studies, counseling on weight management,  reviewing the assessment and plan with patient, and post visit ordering and reviewing of  diagnostics and therapeutics with patient  .   Follow-up: Return in about 2 months (around 11/05/2023) for follow up diabetes.   Sherlene Shams, MD

## 2023-09-04 NOTE — Patient Instructions (Addendum)
 INCREASE THE TRESIBA TO 20 UNITS DAILY AND CHANGE YOUR MEALTIME SLIDING SCALE AT DINNERTIME  TO THE FOLLOWING  8 UNITS FOR CBG 150  12 UNITS FOR  PRE MEAL SUGAR OF 200 , 14 UNITS IF PRE MEAL SUGAR IS 240 OR HIGHER   IF THIS NEW REGIMEN CAUSES YOUR MORNING SUGARS TO DROP < 80  ,  RESUME  YOUR PRIOR REGIMEN OF 5 /10  AT DINNERTIME    A BEDTIME SNACK OF FAIRLIFE MILK  (4 TO 8 OUNCES) SHOULD KEEP YOU FROM BOTTOMING OUT    I AM PRESCRIBING MOMETASONE TO APPLY TO YOUR EAR CANAL TWICE DAILY FOR ITCHING DUE TO ECZEMA

## 2023-09-06 DIAGNOSIS — H60549 Acute eczematoid otitis externa, unspecified ear: Secondary | ICD-10-CM | POA: Insufficient documentation

## 2023-09-06 NOTE — Assessment & Plan Note (Signed)
 Pansensitivie E Coli and Enterococcus Faecalis by Oct 29 culture of wound drainage.  She required multiple debridements in the OR , prlonged IV abx,  and Wound Ac replacement.Marland Kitchen  wound is healing well per review of vascular surgery follow up

## 2023-09-06 NOTE — Assessment & Plan Note (Addendum)
 Advised to increase Tresiba to 20 units and increase the humalog sliding scale to 8 units for pre dinner CBG 150 or higher  12 units ofr CBG 200 or higher, and 14 units for CBF 240 or higher

## 2023-09-06 NOTE — Assessment & Plan Note (Signed)
Mometasone cream prescribed  

## 2023-09-07 ENCOUNTER — Ambulatory Visit (INDEPENDENT_AMBULATORY_CARE_PROVIDER_SITE_OTHER): Payer: 59 | Admitting: Vascular Surgery

## 2023-09-07 ENCOUNTER — Encounter (INDEPENDENT_AMBULATORY_CARE_PROVIDER_SITE_OTHER): Payer: Self-pay

## 2023-09-07 VITALS — BP 109/56 | HR 76 | Resp 16 | Wt 153.0 lb

## 2023-09-07 DIAGNOSIS — T827XXS Infection and inflammatory reaction due to other cardiac and vascular devices, implants and grafts, sequela: Secondary | ICD-10-CM

## 2023-09-07 DIAGNOSIS — T8189XA Other complications of procedures, not elsewhere classified, initial encounter: Secondary | ICD-10-CM | POA: Diagnosis not present

## 2023-09-07 DIAGNOSIS — S31104A Unspecified open wound of abdominal wall, left lower quadrant without penetration into peritoneal cavity, initial encounter: Secondary | ICD-10-CM | POA: Diagnosis not present

## 2023-09-07 NOTE — Progress Notes (Signed)
 Patient ID: Laura Reed, female   DOB: [birth year], 63 y.o.   MRN: [id number]  No chief complaint on file.   HPI Laura Reed is a 63 y.o. female.    Patient reports that the [facility] was alarming quite a bit this weekend.  She denies pain at the site.  She denies fever or chills.  No changes in drainage.   Past Medical History:  Diagnosis Date   Absence of kidney    left   Anxiety    Arthritis    Atherosclerosis of native arteries of extremities with intermittent claudication, bilateral legs (HCC)    Bladder cancer (HCC)    CHF (congestive heart failure) (HCC)    Complication of anesthesia    BP HAS  RUN LOW AFTER SURGERY-LUNGS FILLED UP WITH FLUID AFTER  LEG STENT SURGERY    Coronary artery disease    Diabetes mellitus    Family history of adverse reaction to anesthesia    Sister - PONV   GERD (gastroesophageal reflux disease)    OCC TUMS   Heart murmur    Hemorrhoid    History of methicillin resistant staphylococcus aureus (MRSA) 2007   Hypertension    Neuropathy    PVD (peripheral vascular disease) (HCC)    Thyroid nodule    right   Unspecified osteoarthritis, unspecified site    Urothelial carcinoma of kidney (HCC) 10/31/2014   INVASIVE UROTHELIAL CARCINOMA, LOW GRADE. T1, Nx.   Vitamin D deficiency, unspecified    Wears dentures    full upper and lower    Past Surgical History:  Procedure Laterality Date   AMPUTATION TOE     right (4th and 5th); left (great toe, 3rd)   AMPUTATION TOE Right 07/16/2018   Procedure: AMPUTATION TOE/MPJ right 2nd;  Surgeon: Linus Galas, DPM;  Location: [facility];  Service: Podiatry;  Laterality: Right;   APPLICATION OF WOUND VAC Left 06/26/2023   Procedure: WOUND VAC EXCHANGE LEFT GROIN;  Surgeon: Renford Dills, MD;  Location: [facility];  Service: Vascular;  Laterality: Left;   APPLICATION OF WOUND VAC Left 06/29/2023   Procedure: WOUND VAC EXCHANGE LEFT GROIN;  Surgeon: Renford Dills, MD;  Location: [facility];   Service: Vascular;  Laterality: Left;   APPLICATION OF WOUND VAC Left 07/02/2023   Procedure: WOUND VAC EXCHANGE LEFT GROIN;  Surgeon: Annice Needy, MD;  Location: [facility];  Service: Vascular;  Laterality: Left;   APPLICATION OF WOUND VAC Left 07/06/2023   Procedure: WOUND VAC EXCHANGE LEFT GROIN;  Surgeon: Renford Dills, MD;  Location: [facility];  Service: Vascular;  Laterality: Left;   APPLICATION OF WOUND VAC Left 07/16/2023   Procedure: WOUND VAC EXCHANGE LEFT GROIN;  Surgeon: Renford Dills, MD;  Location: [facility];  Service: Vascular;  Laterality: Left;   APPLICATION OF WOUND VAC Left 07/20/2023   Procedure: WOUND VAC EXCHANGE;  Surgeon: Renford Dills, MD;  Location: [facility];  Service: Vascular;  Laterality: Left;   APPLICATION OF WOUND VAC Left 07/23/2023   Procedure: WOUND VAC EXCHANGE;  Surgeon: Renford Dills, MD;  Location: [facility];  Service: Vascular;  Laterality: Left;   APPLICATION OF WOUND VAC Left 07/13/2023   Procedure: WOUND VAC EXCHANGE LEFT GROIN;  Surgeon: Renford Dills, MD;  Location: [facility];  Service: Vascular;  Laterality: Left;   APPLICATION OF WOUND VAC Left 07/09/2023   Procedure: WOUND VAC EXCHANGE OF LEFT GROIN;  Surgeon: Annice Needy, MD;  Location: [facility];  Service: Vascular;  Laterality: Left;   ARTERIAL BYPASS SURGRY  2009, 2013 x 2   right leg , done in [place]   CARDIAC CATHETERIZATION     CAROTID ENDARTERECTOMY Right 01/2014   Dr Gilda Crease   CATARACT EXTRACTION W/PHACO Right 12/14/2014   Procedure: CATARACT EXTRACTION PHACO AND INTRAOCULAR LENS PLACEMENT (IOC);  Surgeon: Lia Hopping, MD;  Location: [facility];  Service: Ophthalmology;  Laterality: Right;  [place]   00:38.6              AP        7.1                   CDE  2.76   CATARACT EXTRACTION W/PHACO Left 12/06/2019   Procedure: CATARACT EXTRACTION PHACO AND INTRAOCULAR LENS PLACEMENT (IOC) LEFT DIABETIC;  Surgeon: Galen Manila, MD;  Location: [facility];  Service:  Ophthalmology;  Laterality: Left;  9.08 1:06.4   CESAREAN SECTION     CHOLECYSTECTOMY  03-03-12   Porcelain gallbladder, gallstones,  [place]   COLONOSCOPY W/ BIOPSIES  04/28/2012   Hyperplastic rectal polyps.   COLONOSCOPY WITH PROPOFOL N/A 04/02/2022   Procedure: COLONOSCOPY WITH PROPOFOL;  Surgeon: Earline Mayotte, MD;  Location: [facility];  Service: Endoscopy;  Laterality: N/A;   CORONARY ARTERY BYPASS GRAFT  2009   3 vessel   CYSTOSCOPY W/ RETROGRADES Right 09/01/2016   Procedure: CYSTOSCOPY WITH RETROGRADE PYELOGRAM;  Surgeon: Vanna Scotland, MD;  Location: [facility];  Service: Urology;  Laterality: Right;   CYSTOSCOPY W/ RETROGRADES Bilateral 03/19/2020   Procedure: CYSTOSCOPY WITH RETROGRADE PYELOGRAM;  Surgeon: Vanna Scotland, MD;  Location: [facility];  Service: Urology;  Laterality: Bilateral;   CYSTOSCOPY WITH BIOPSY N/A 03/19/2020   Procedure: CYSTOSCOPY WITH BIOPSY;  Surgeon: Vanna Scotland, MD;  Location: [facility];  Service: Urology;  Laterality: N/A;   EYE SURGERY     FEMORAL-POPLITEAL BYPASS GRAFT Left 04/29/2023   Procedure: BYPASS GRAFT FEMORAL-POPLITEAL ARTERY;  Surgeon: Renford Dills, MD;  Location: [facility];  Service: Vascular;  Laterality: Left;   HERNIA REPAIR  10-31-14   ventral, retro-rectus atrium mesh   INCISION AND DRAINAGE ABSCESS Left 06/24/2023   Procedure: INCISION AND DRAINAGE WITH SATORIUS FLAP;  Surgeon: Renford Dills, MD;  Location: [facility];  Service: Vascular;  Laterality: Left;   IRRIGATION AND DEBRIDEMENT FOOT Left 01/18/2019   Procedure: IRRIGATION AND DEBRIDEMENT FOOT;  Surgeon: Linus Galas, DPM;  Location: [facility];  Service: Podiatry;  Laterality: Left;   LOWER EXTREMITY ANGIOGRAPHY Left 12/10/2016   Procedure: Lower Extremity Angiography;  Surgeon: Renford Dills, MD;  Location: [facility];  Service: Cardiovascular;  Laterality: Left;   LOWER EXTREMITY ANGIOGRAPHY Left 02/02/2018   Procedure: LOWER EXTREMITY ANGIOGRAPHY;   Surgeon: Renford Dills, MD;  Location: [facility];  Service: Cardiovascular;  Laterality: Left;   LOWER EXTREMITY ANGIOGRAPHY Left 05/05/2018   Procedure: LOWER EXTREMITY ANGIOGRAPHY;  Surgeon: Renford Dills, MD;  Location: [facility];  Service: Cardiovascular;  Laterality: Left;   LOWER EXTREMITY ANGIOGRAPHY Left 12/04/2020   Procedure: LOWER EXTREMITY ANGIOGRAPHY with Intervention;  Surgeon: Renford Dills, MD;  Location: [facility];  Service: Cardiovascular;  Laterality: Left;   LOWER EXTREMITY ANGIOGRAPHY Left 04/24/2023   Procedure: Lower Extremity Angiography;  Surgeon: Renford Dills, MD;  Location: [facility];  Service: Cardiovascular;  Laterality: Left;   LOWER EXTREMITY ANGIOGRAPHY Left 05/01/2023   Procedure:  Lower Extremity Angiography;  Surgeon: Renford Dills, MD;  Location: [facility];  Service: Cardiovascular;  Laterality: Left;   NEPHRECTOMY Left 10-31-14   PERIPHERAL VASCULAR CATHETERIZATION Left 05/01/2015   Procedure: Lower Extremity Angiography;  Surgeon: Renford Dills, MD;  Location: [facility];  Service: Cardiovascular;  Laterality: Left;   PERIPHERAL VASCULAR CATHETERIZATION  05/01/2015   Procedure: Lower Extremity Intervention;  Surgeon: Renford Dills, MD;  Location: [facility];  Service: Cardiovascular;;   PERIPHERAL VASCULAR CATHETERIZATION Left 02/20/2015   Procedure: Pelvic Angiography;  Surgeon: Renford Dills, MD;  Location: [facility];  Service: Cardiovascular;  Laterality: Left;   TRANSMETATARSAL AMPUTATION Left 05/05/2023   Procedure: TRANSMETATARSAL AMPUTATION LEFT;  Surgeon: Gwyneth Revels, DPM;  Location: [facility];  Service: Orthopedics/Podiatry;  Laterality: Left;   TRANSURETHRAL RESECTION OF BLADDER TUMOR WITH MITOMYCIN-C N/A 09/01/2016   Procedure: TRANSURETHRAL RESECTION OF BLADDER TUMOR WITH MITOMYCIN-C;  Surgeon: Vanna Scotland, MD;  Location: [facility];  Service: Urology;  Laterality: N/A;      No Known Allergies  Current Outpatient Medications  Medication Sig Dispense Refill   acetaminophen (TYLENOL) 500 MG tablet Take 1,000 mg by mouth every 6 (six) hours as needed for mild pain or headache.      acidophilus (RISAQUAD) CAPS capsule Take 2 capsules by mouth 2 (two) times daily.     amoxicillin-clavulanate (AUGMENTIN) 875-125 MG tablet Take 1 tablet by mouth every 12 (twelve) hours. 28 tablet 6   apixaban (ELIQUIS) 2.5 MG TABS tablet Take 1 tablet (2.5 mg total) by mouth 2 (two) times daily. 60 tablet 11   aspirin 81 MG chewable tablet Chew 81 mg by mouth at bedtime.      atorvastatin (LIPITOR) 20 MG tablet TAKE 1 TABLET(20 MG) BY MOUTH DAILY 90 tablet 1   Cholecalciferol (VITAMIN D3) 2000 UNITS TABS Take 2,000 Units by mouth daily after supper.      co-enzyme Q-10 30 MG capsule Take 30 mg by mouth daily.     Continuous Glucose Sensor (FREESTYLE LIBRE 2 SENSOR) MISC Use to check sugar at least 4 times daily 2 each 2   empagliflozin (JARDIANCE) 25 MG TABS tablet TAKE 1 TABLET(25 MG) BY MOUTH DAILY BEFORE AND BREAKFAST 30 tablet 0   gabapentin (NEURONTIN) 300 MG capsule Take 300 mg by mouth 2 (two) times daily.     glucose blood test strip Use you check blood sugars up to four times daily. 100 each 12   insulin degludec (TRESIBA FLEXTOUCH) 100 UNIT/ML FlexTouch Pen Inject 15 Units into the skin daily. Reduced from 22 units daily.     insulin lispro (HUMALOG KWIKPEN) 100 UNIT/ML KwikPen ADMINISTER 5-10 UNITS UNDER THE SKIN THREE TIMES DAILY if you eat a meal.  5 units if blood sugar <200, 10 units if blood sugar >200. 15 mL 1   Insulin Pen Needle (PEN NEEDLES) 32G X 4 MM MISC Use to take insulin daily 100 each 3   mometasone (ELOCON) 0.1 % cream APPLY TWICE DAILY TO EAR CANAL AS NEEDED FOR ITCHING 15 g 1   Multiple Vitamin (MULTIVITAMIN) tablet Take 1 tablet by mouth daily.     polycarbophil (FIBERCON) 625 MG tablet Take 1 tablet (625 mg  total) by mouth 2 (two) times daily. For stool bulking. 60 tablet 0   triamcinolone ointment (KENALOG) 0.5 % Apply 1 Application topically 2 (two) times daily. 80 g 0   losartan (COZAAR) 50 MG tablet Take 50 mg  by mouth daily. (Patient not taking: Reported on 08/06/2023)     oxyCODONE-acetaminophen (PERCOCET/ROXICET) 5-325 MG tablet Take 1 tablet by mouth daily as needed (with wound vac exchanges). (Patient not taking: Reported on 08/06/2023) 15 tablet 0   No current facility-administered medications for this visit.        Physical Exam BP (!) 109/56   Pulse 76   Resp 16   Wt 153 lb (69.4 kg)   BMI 23.96 kg/m  Gen:  WD/WN, NAD Skin: The left groin wound is now half the size it was and less than a millimeter in depth.  It is richly granulated.  The graft has now been completely covered and there is a large rim of epithelial tissue along the edge circumferentially.     Assessment/Plan: 1. Vascular graft infection, sequela (Primary) The patient has done incredibly well and the wound appears to be nearly healed.  At this point we only need epithelialization to be complete.  I will discontinue the VAC and we will start Aquacel dressing changes every 3 to 4 days.  She will follow-up with me next week      [name] 09/07/2023, 8:53 PM   This note was created with Dragon medical transcription system.  Any errors from dictation are unintentional.

## 2023-09-09 ENCOUNTER — Telehealth (INDEPENDENT_AMBULATORY_CARE_PROVIDER_SITE_OTHER): Payer: Self-pay

## 2023-09-09 DIAGNOSIS — E1169 Type 2 diabetes mellitus with other specified complication: Secondary | ICD-10-CM | POA: Diagnosis not present

## 2023-09-09 DIAGNOSIS — Z905 Acquired absence of kidney: Secondary | ICD-10-CM | POA: Diagnosis not present

## 2023-09-09 DIAGNOSIS — Z85528 Personal history of other malignant neoplasm of kidney: Secondary | ICD-10-CM | POA: Diagnosis not present

## 2023-09-09 DIAGNOSIS — I5032 Chronic diastolic (congestive) heart failure: Secondary | ICD-10-CM | POA: Diagnosis not present

## 2023-09-09 DIAGNOSIS — I2510 Atherosclerotic heart disease of native coronary artery without angina pectoris: Secondary | ICD-10-CM | POA: Diagnosis not present

## 2023-09-09 DIAGNOSIS — E1142 Type 2 diabetes mellitus with diabetic polyneuropathy: Secondary | ICD-10-CM | POA: Diagnosis not present

## 2023-09-09 DIAGNOSIS — E871 Hypo-osmolality and hyponatremia: Secondary | ICD-10-CM | POA: Diagnosis not present

## 2023-09-09 DIAGNOSIS — T8149XA Infection following a procedure, other surgical site, initial encounter: Secondary | ICD-10-CM | POA: Diagnosis not present

## 2023-09-09 DIAGNOSIS — D72829 Elevated white blood cell count, unspecified: Secondary | ICD-10-CM | POA: Diagnosis not present

## 2023-09-09 DIAGNOSIS — E785 Hyperlipidemia, unspecified: Secondary | ICD-10-CM | POA: Diagnosis not present

## 2023-09-09 DIAGNOSIS — Z89422 Acquired absence of other left toe(s): Secondary | ICD-10-CM | POA: Diagnosis not present

## 2023-09-09 DIAGNOSIS — E1151 Type 2 diabetes mellitus with diabetic peripheral angiopathy without gangrene: Secondary | ICD-10-CM | POA: Diagnosis not present

## 2023-09-09 DIAGNOSIS — Z7982 Long term (current) use of aspirin: Secondary | ICD-10-CM | POA: Diagnosis not present

## 2023-09-09 DIAGNOSIS — L02214 Cutaneous abscess of groin: Secondary | ICD-10-CM | POA: Diagnosis not present

## 2023-09-09 DIAGNOSIS — Z87891 Personal history of nicotine dependence: Secondary | ICD-10-CM | POA: Diagnosis not present

## 2023-09-09 DIAGNOSIS — D649 Anemia, unspecified: Secondary | ICD-10-CM | POA: Diagnosis not present

## 2023-09-09 DIAGNOSIS — Z794 Long term (current) use of insulin: Secondary | ICD-10-CM | POA: Diagnosis not present

## 2023-09-09 DIAGNOSIS — I6529 Occlusion and stenosis of unspecified carotid artery: Secondary | ICD-10-CM | POA: Diagnosis not present

## 2023-09-09 DIAGNOSIS — Z8551 Personal history of malignant neoplasm of bladder: Secondary | ICD-10-CM | POA: Diagnosis not present

## 2023-09-09 DIAGNOSIS — R197 Diarrhea, unspecified: Secondary | ICD-10-CM | POA: Diagnosis not present

## 2023-09-09 DIAGNOSIS — I110 Hypertensive heart disease with heart failure: Secondary | ICD-10-CM | POA: Diagnosis not present

## 2023-09-09 DIAGNOSIS — M79605 Pain in left leg: Secondary | ICD-10-CM | POA: Diagnosis not present

## 2023-09-09 DIAGNOSIS — Z9181 History of falling: Secondary | ICD-10-CM | POA: Diagnosis not present

## 2023-09-09 NOTE — Telephone Encounter (Signed)
 Mandy called from [facility] stating that [facility] told her that home health is now taking over wound care now that she had her Wound Vac removed. Mandy called to verify and to receive verbal orders.   Please advise

## 2023-09-09 NOTE — Telephone Encounter (Signed)
 Laura Reed called after the home health nurse. She would like to know if the Home health nurse can continue to change her bandages and how often.   Please advise

## 2023-09-09 NOTE — Telephone Encounter (Signed)
 Per Dr. Marijean Heath notes, Aquacell dressing changes every 3 to 4 days

## 2023-09-09 NOTE — Telephone Encounter (Signed)
 Orders given. She stated she will do the aquacel will a foam boarder. 2 times a week

## 2023-09-10 ENCOUNTER — Ambulatory Visit (INDEPENDENT_AMBULATORY_CARE_PROVIDER_SITE_OTHER): Payer: 59

## 2023-09-10 NOTE — Telephone Encounter (Signed)
 Home health Nurse and patient was questioning should she come in Monday for her follow up appt since she now has home health.  Per [employer]- He would still like for her to come in be see so he can see how it looks after coming off the wound vac.

## 2023-09-10 NOTE — Telephone Encounter (Signed)
 Left a VM on both home and cell phone that she should come to her appt Monday to see [employer]

## 2023-09-11 DIAGNOSIS — Z7982 Long term (current) use of aspirin: Secondary | ICD-10-CM | POA: Diagnosis not present

## 2023-09-11 DIAGNOSIS — E785 Hyperlipidemia, unspecified: Secondary | ICD-10-CM | POA: Diagnosis not present

## 2023-09-11 DIAGNOSIS — Z794 Long term (current) use of insulin: Secondary | ICD-10-CM | POA: Diagnosis not present

## 2023-09-11 DIAGNOSIS — I2510 Atherosclerotic heart disease of native coronary artery without angina pectoris: Secondary | ICD-10-CM | POA: Diagnosis not present

## 2023-09-11 DIAGNOSIS — T8131XA Disruption of external operation (surgical) wound, not elsewhere classified, initial encounter: Secondary | ICD-10-CM | POA: Diagnosis not present

## 2023-09-11 DIAGNOSIS — T8149XA Infection following a procedure, other surgical site, initial encounter: Secondary | ICD-10-CM | POA: Diagnosis not present

## 2023-09-11 DIAGNOSIS — Z8551 Personal history of malignant neoplasm of bladder: Secondary | ICD-10-CM | POA: Diagnosis not present

## 2023-09-11 DIAGNOSIS — Z85528 Personal history of other malignant neoplasm of kidney: Secondary | ICD-10-CM | POA: Diagnosis not present

## 2023-09-11 DIAGNOSIS — Z9181 History of falling: Secondary | ICD-10-CM | POA: Diagnosis not present

## 2023-09-11 DIAGNOSIS — Z905 Acquired absence of kidney: Secondary | ICD-10-CM | POA: Diagnosis not present

## 2023-09-11 DIAGNOSIS — Z87891 Personal history of nicotine dependence: Secondary | ICD-10-CM | POA: Diagnosis not present

## 2023-09-11 DIAGNOSIS — R197 Diarrhea, unspecified: Secondary | ICD-10-CM | POA: Diagnosis not present

## 2023-09-11 DIAGNOSIS — E1151 Type 2 diabetes mellitus with diabetic peripheral angiopathy without gangrene: Secondary | ICD-10-CM | POA: Diagnosis not present

## 2023-09-11 DIAGNOSIS — I6529 Occlusion and stenosis of unspecified carotid artery: Secondary | ICD-10-CM | POA: Diagnosis not present

## 2023-09-11 DIAGNOSIS — I110 Hypertensive heart disease with heart failure: Secondary | ICD-10-CM | POA: Diagnosis not present

## 2023-09-11 DIAGNOSIS — S31104A Unspecified open wound of abdominal wall, left lower quadrant without penetration into peritoneal cavity, initial encounter: Secondary | ICD-10-CM | POA: Diagnosis not present

## 2023-09-11 DIAGNOSIS — M79605 Pain in left leg: Secondary | ICD-10-CM | POA: Diagnosis not present

## 2023-09-11 DIAGNOSIS — L02214 Cutaneous abscess of groin: Secondary | ICD-10-CM | POA: Diagnosis not present

## 2023-09-11 DIAGNOSIS — D72829 Elevated white blood cell count, unspecified: Secondary | ICD-10-CM | POA: Diagnosis not present

## 2023-09-11 DIAGNOSIS — E1169 Type 2 diabetes mellitus with other specified complication: Secondary | ICD-10-CM | POA: Diagnosis not present

## 2023-09-11 DIAGNOSIS — Z89422 Acquired absence of other left toe(s): Secondary | ICD-10-CM | POA: Diagnosis not present

## 2023-09-11 DIAGNOSIS — I5032 Chronic diastolic (congestive) heart failure: Secondary | ICD-10-CM | POA: Diagnosis not present

## 2023-09-11 DIAGNOSIS — E871 Hypo-osmolality and hyponatremia: Secondary | ICD-10-CM | POA: Diagnosis not present

## 2023-09-11 DIAGNOSIS — D649 Anemia, unspecified: Secondary | ICD-10-CM | POA: Diagnosis not present

## 2023-09-11 DIAGNOSIS — E1142 Type 2 diabetes mellitus with diabetic polyneuropathy: Secondary | ICD-10-CM | POA: Diagnosis not present

## 2023-09-14 ENCOUNTER — Encounter (INDEPENDENT_AMBULATORY_CARE_PROVIDER_SITE_OTHER): Payer: Self-pay

## 2023-09-14 ENCOUNTER — Ambulatory Visit (INDEPENDENT_AMBULATORY_CARE_PROVIDER_SITE_OTHER): Payer: 59 | Admitting: Vascular Surgery

## 2023-09-14 VITALS — BP 147/64 | HR 73 | Resp 16 | Wt 145.0 lb

## 2023-09-14 DIAGNOSIS — T827XXS Infection and inflammatory reaction due to other cardiac and vascular devices, implants and grafts, sequela: Secondary | ICD-10-CM | POA: Diagnosis not present

## 2023-09-14 DIAGNOSIS — T827XXD Infection and inflammatory reaction due to other cardiac and vascular devices, implants and grafts, subsequent encounter: Secondary | ICD-10-CM

## 2023-09-14 NOTE — Progress Notes (Signed)
 Patient was seen today for 1 week wound check. Patient wound is close to healing. Aquacel and dry guaze was applied over the wound.  Home health will continue wound dressing changes twice a week.Patient will return in 3 weeks for wound check

## 2023-09-16 DIAGNOSIS — E1151 Type 2 diabetes mellitus with diabetic peripheral angiopathy without gangrene: Secondary | ICD-10-CM | POA: Diagnosis not present

## 2023-09-16 DIAGNOSIS — R197 Diarrhea, unspecified: Secondary | ICD-10-CM | POA: Diagnosis not present

## 2023-09-16 DIAGNOSIS — I2510 Atherosclerotic heart disease of native coronary artery without angina pectoris: Secondary | ICD-10-CM | POA: Diagnosis not present

## 2023-09-16 DIAGNOSIS — Z85528 Personal history of other malignant neoplasm of kidney: Secondary | ICD-10-CM | POA: Diagnosis not present

## 2023-09-16 DIAGNOSIS — E1169 Type 2 diabetes mellitus with other specified complication: Secondary | ICD-10-CM | POA: Diagnosis not present

## 2023-09-16 DIAGNOSIS — Z89422 Acquired absence of other left toe(s): Secondary | ICD-10-CM | POA: Diagnosis not present

## 2023-09-16 DIAGNOSIS — Z87891 Personal history of nicotine dependence: Secondary | ICD-10-CM | POA: Diagnosis not present

## 2023-09-16 DIAGNOSIS — Z905 Acquired absence of kidney: Secondary | ICD-10-CM | POA: Diagnosis not present

## 2023-09-16 DIAGNOSIS — T8149XA Infection following a procedure, other surgical site, initial encounter: Secondary | ICD-10-CM | POA: Diagnosis not present

## 2023-09-16 DIAGNOSIS — Z794 Long term (current) use of insulin: Secondary | ICD-10-CM | POA: Diagnosis not present

## 2023-09-16 DIAGNOSIS — D72829 Elevated white blood cell count, unspecified: Secondary | ICD-10-CM | POA: Diagnosis not present

## 2023-09-16 DIAGNOSIS — Z9181 History of falling: Secondary | ICD-10-CM | POA: Diagnosis not present

## 2023-09-16 DIAGNOSIS — D649 Anemia, unspecified: Secondary | ICD-10-CM | POA: Diagnosis not present

## 2023-09-16 DIAGNOSIS — E871 Hypo-osmolality and hyponatremia: Secondary | ICD-10-CM | POA: Diagnosis not present

## 2023-09-16 DIAGNOSIS — I6529 Occlusion and stenosis of unspecified carotid artery: Secondary | ICD-10-CM | POA: Diagnosis not present

## 2023-09-16 DIAGNOSIS — E1142 Type 2 diabetes mellitus with diabetic polyneuropathy: Secondary | ICD-10-CM | POA: Diagnosis not present

## 2023-09-16 DIAGNOSIS — Z8551 Personal history of malignant neoplasm of bladder: Secondary | ICD-10-CM | POA: Diagnosis not present

## 2023-09-16 DIAGNOSIS — Z7982 Long term (current) use of aspirin: Secondary | ICD-10-CM | POA: Diagnosis not present

## 2023-09-16 DIAGNOSIS — I110 Hypertensive heart disease with heart failure: Secondary | ICD-10-CM | POA: Diagnosis not present

## 2023-09-16 DIAGNOSIS — M79605 Pain in left leg: Secondary | ICD-10-CM | POA: Diagnosis not present

## 2023-09-16 DIAGNOSIS — L02214 Cutaneous abscess of groin: Secondary | ICD-10-CM | POA: Diagnosis not present

## 2023-09-16 DIAGNOSIS — I5032 Chronic diastolic (congestive) heart failure: Secondary | ICD-10-CM | POA: Diagnosis not present

## 2023-09-16 DIAGNOSIS — E785 Hyperlipidemia, unspecified: Secondary | ICD-10-CM | POA: Diagnosis not present

## 2023-09-17 ENCOUNTER — Ambulatory Visit (INDEPENDENT_AMBULATORY_CARE_PROVIDER_SITE_OTHER): Payer: 59

## 2023-09-18 DIAGNOSIS — E1169 Type 2 diabetes mellitus with other specified complication: Secondary | ICD-10-CM | POA: Diagnosis not present

## 2023-09-18 DIAGNOSIS — Z87891 Personal history of nicotine dependence: Secondary | ICD-10-CM | POA: Diagnosis not present

## 2023-09-18 DIAGNOSIS — R197 Diarrhea, unspecified: Secondary | ICD-10-CM | POA: Diagnosis not present

## 2023-09-18 DIAGNOSIS — I6529 Occlusion and stenosis of unspecified carotid artery: Secondary | ICD-10-CM | POA: Diagnosis not present

## 2023-09-18 DIAGNOSIS — T8149XA Infection following a procedure, other surgical site, initial encounter: Secondary | ICD-10-CM | POA: Diagnosis not present

## 2023-09-18 DIAGNOSIS — I110 Hypertensive heart disease with heart failure: Secondary | ICD-10-CM | POA: Diagnosis not present

## 2023-09-18 DIAGNOSIS — E785 Hyperlipidemia, unspecified: Secondary | ICD-10-CM | POA: Diagnosis not present

## 2023-09-18 DIAGNOSIS — I2510 Atherosclerotic heart disease of native coronary artery without angina pectoris: Secondary | ICD-10-CM | POA: Diagnosis not present

## 2023-09-18 DIAGNOSIS — E1142 Type 2 diabetes mellitus with diabetic polyneuropathy: Secondary | ICD-10-CM | POA: Diagnosis not present

## 2023-09-18 DIAGNOSIS — I5032 Chronic diastolic (congestive) heart failure: Secondary | ICD-10-CM | POA: Diagnosis not present

## 2023-09-18 DIAGNOSIS — D72829 Elevated white blood cell count, unspecified: Secondary | ICD-10-CM | POA: Diagnosis not present

## 2023-09-18 DIAGNOSIS — Z8551 Personal history of malignant neoplasm of bladder: Secondary | ICD-10-CM | POA: Diagnosis not present

## 2023-09-18 DIAGNOSIS — E1151 Type 2 diabetes mellitus with diabetic peripheral angiopathy without gangrene: Secondary | ICD-10-CM | POA: Diagnosis not present

## 2023-09-18 DIAGNOSIS — Z7982 Long term (current) use of aspirin: Secondary | ICD-10-CM | POA: Diagnosis not present

## 2023-09-18 DIAGNOSIS — M79605 Pain in left leg: Secondary | ICD-10-CM | POA: Diagnosis not present

## 2023-09-18 DIAGNOSIS — E871 Hypo-osmolality and hyponatremia: Secondary | ICD-10-CM | POA: Diagnosis not present

## 2023-09-18 DIAGNOSIS — Z85528 Personal history of other malignant neoplasm of kidney: Secondary | ICD-10-CM | POA: Diagnosis not present

## 2023-09-18 DIAGNOSIS — Z794 Long term (current) use of insulin: Secondary | ICD-10-CM | POA: Diagnosis not present

## 2023-09-18 DIAGNOSIS — D649 Anemia, unspecified: Secondary | ICD-10-CM | POA: Diagnosis not present

## 2023-09-18 DIAGNOSIS — L02214 Cutaneous abscess of groin: Secondary | ICD-10-CM | POA: Diagnosis not present

## 2023-09-18 DIAGNOSIS — Z9181 History of falling: Secondary | ICD-10-CM | POA: Diagnosis not present

## 2023-09-18 DIAGNOSIS — Z905 Acquired absence of kidney: Secondary | ICD-10-CM | POA: Diagnosis not present

## 2023-09-18 DIAGNOSIS — Z89422 Acquired absence of other left toe(s): Secondary | ICD-10-CM | POA: Diagnosis not present

## 2023-09-21 DIAGNOSIS — Z85528 Personal history of other malignant neoplasm of kidney: Secondary | ICD-10-CM | POA: Diagnosis not present

## 2023-09-21 DIAGNOSIS — I5032 Chronic diastolic (congestive) heart failure: Secondary | ICD-10-CM | POA: Diagnosis not present

## 2023-09-21 DIAGNOSIS — Z87891 Personal history of nicotine dependence: Secondary | ICD-10-CM | POA: Diagnosis not present

## 2023-09-21 DIAGNOSIS — R197 Diarrhea, unspecified: Secondary | ICD-10-CM | POA: Diagnosis not present

## 2023-09-21 DIAGNOSIS — E1142 Type 2 diabetes mellitus with diabetic polyneuropathy: Secondary | ICD-10-CM | POA: Diagnosis not present

## 2023-09-21 DIAGNOSIS — I6529 Occlusion and stenosis of unspecified carotid artery: Secondary | ICD-10-CM | POA: Diagnosis not present

## 2023-09-21 DIAGNOSIS — M79605 Pain in left leg: Secondary | ICD-10-CM | POA: Diagnosis not present

## 2023-09-21 DIAGNOSIS — L02214 Cutaneous abscess of groin: Secondary | ICD-10-CM | POA: Diagnosis not present

## 2023-09-21 DIAGNOSIS — D649 Anemia, unspecified: Secondary | ICD-10-CM | POA: Diagnosis not present

## 2023-09-21 DIAGNOSIS — E1151 Type 2 diabetes mellitus with diabetic peripheral angiopathy without gangrene: Secondary | ICD-10-CM | POA: Diagnosis not present

## 2023-09-21 DIAGNOSIS — D72829 Elevated white blood cell count, unspecified: Secondary | ICD-10-CM | POA: Diagnosis not present

## 2023-09-21 DIAGNOSIS — Z89422 Acquired absence of other left toe(s): Secondary | ICD-10-CM | POA: Diagnosis not present

## 2023-09-21 DIAGNOSIS — I110 Hypertensive heart disease with heart failure: Secondary | ICD-10-CM | POA: Diagnosis not present

## 2023-09-21 DIAGNOSIS — E1169 Type 2 diabetes mellitus with other specified complication: Secondary | ICD-10-CM | POA: Diagnosis not present

## 2023-09-21 DIAGNOSIS — Z905 Acquired absence of kidney: Secondary | ICD-10-CM | POA: Diagnosis not present

## 2023-09-21 DIAGNOSIS — E871 Hypo-osmolality and hyponatremia: Secondary | ICD-10-CM | POA: Diagnosis not present

## 2023-09-21 DIAGNOSIS — T8149XA Infection following a procedure, other surgical site, initial encounter: Secondary | ICD-10-CM | POA: Diagnosis not present

## 2023-09-21 DIAGNOSIS — Z8551 Personal history of malignant neoplasm of bladder: Secondary | ICD-10-CM | POA: Diagnosis not present

## 2023-09-21 DIAGNOSIS — Z794 Long term (current) use of insulin: Secondary | ICD-10-CM | POA: Diagnosis not present

## 2023-09-21 DIAGNOSIS — Z9181 History of falling: Secondary | ICD-10-CM | POA: Diagnosis not present

## 2023-09-21 DIAGNOSIS — Z7982 Long term (current) use of aspirin: Secondary | ICD-10-CM | POA: Diagnosis not present

## 2023-09-21 DIAGNOSIS — I2510 Atherosclerotic heart disease of native coronary artery without angina pectoris: Secondary | ICD-10-CM | POA: Diagnosis not present

## 2023-09-21 DIAGNOSIS — E785 Hyperlipidemia, unspecified: Secondary | ICD-10-CM | POA: Diagnosis not present

## 2023-09-26 DIAGNOSIS — Z7982 Long term (current) use of aspirin: Secondary | ICD-10-CM | POA: Diagnosis not present

## 2023-09-26 DIAGNOSIS — Z89422 Acquired absence of other left toe(s): Secondary | ICD-10-CM | POA: Diagnosis not present

## 2023-09-26 DIAGNOSIS — T8149XA Infection following a procedure, other surgical site, initial encounter: Secondary | ICD-10-CM | POA: Diagnosis not present

## 2023-09-26 DIAGNOSIS — E871 Hypo-osmolality and hyponatremia: Secondary | ICD-10-CM | POA: Diagnosis not present

## 2023-09-26 DIAGNOSIS — R197 Diarrhea, unspecified: Secondary | ICD-10-CM | POA: Diagnosis not present

## 2023-09-26 DIAGNOSIS — I110 Hypertensive heart disease with heart failure: Secondary | ICD-10-CM | POA: Diagnosis not present

## 2023-09-26 DIAGNOSIS — E1151 Type 2 diabetes mellitus with diabetic peripheral angiopathy without gangrene: Secondary | ICD-10-CM | POA: Diagnosis not present

## 2023-09-26 DIAGNOSIS — E785 Hyperlipidemia, unspecified: Secondary | ICD-10-CM | POA: Diagnosis not present

## 2023-09-26 DIAGNOSIS — D649 Anemia, unspecified: Secondary | ICD-10-CM | POA: Diagnosis not present

## 2023-09-26 DIAGNOSIS — M79605 Pain in left leg: Secondary | ICD-10-CM | POA: Diagnosis not present

## 2023-09-26 DIAGNOSIS — D72829 Elevated white blood cell count, unspecified: Secondary | ICD-10-CM | POA: Diagnosis not present

## 2023-09-26 DIAGNOSIS — Z85528 Personal history of other malignant neoplasm of kidney: Secondary | ICD-10-CM | POA: Diagnosis not present

## 2023-09-26 DIAGNOSIS — E1142 Type 2 diabetes mellitus with diabetic polyneuropathy: Secondary | ICD-10-CM | POA: Diagnosis not present

## 2023-09-26 DIAGNOSIS — I5032 Chronic diastolic (congestive) heart failure: Secondary | ICD-10-CM | POA: Diagnosis not present

## 2023-09-26 DIAGNOSIS — Z9181 History of falling: Secondary | ICD-10-CM | POA: Diagnosis not present

## 2023-09-26 DIAGNOSIS — Z905 Acquired absence of kidney: Secondary | ICD-10-CM | POA: Diagnosis not present

## 2023-09-26 DIAGNOSIS — Z87891 Personal history of nicotine dependence: Secondary | ICD-10-CM | POA: Diagnosis not present

## 2023-09-26 DIAGNOSIS — Z8551 Personal history of malignant neoplasm of bladder: Secondary | ICD-10-CM | POA: Diagnosis not present

## 2023-09-26 DIAGNOSIS — I6529 Occlusion and stenosis of unspecified carotid artery: Secondary | ICD-10-CM | POA: Diagnosis not present

## 2023-09-26 DIAGNOSIS — I2510 Atherosclerotic heart disease of native coronary artery without angina pectoris: Secondary | ICD-10-CM | POA: Diagnosis not present

## 2023-09-26 DIAGNOSIS — L02214 Cutaneous abscess of groin: Secondary | ICD-10-CM | POA: Diagnosis not present

## 2023-09-26 DIAGNOSIS — Z794 Long term (current) use of insulin: Secondary | ICD-10-CM | POA: Diagnosis not present

## 2023-09-26 DIAGNOSIS — E1169 Type 2 diabetes mellitus with other specified complication: Secondary | ICD-10-CM | POA: Diagnosis not present

## 2023-09-28 ENCOUNTER — Encounter: Payer: Self-pay | Admitting: Internal Medicine

## 2023-09-28 DIAGNOSIS — Z122 Encounter for screening for malignant neoplasm of respiratory organs: Secondary | ICD-10-CM | POA: Insufficient documentation

## 2023-10-01 DIAGNOSIS — Z8551 Personal history of malignant neoplasm of bladder: Secondary | ICD-10-CM | POA: Diagnosis not present

## 2023-10-01 DIAGNOSIS — D72829 Elevated white blood cell count, unspecified: Secondary | ICD-10-CM | POA: Diagnosis not present

## 2023-10-01 DIAGNOSIS — I5032 Chronic diastolic (congestive) heart failure: Secondary | ICD-10-CM | POA: Diagnosis not present

## 2023-10-01 DIAGNOSIS — I110 Hypertensive heart disease with heart failure: Secondary | ICD-10-CM | POA: Diagnosis not present

## 2023-10-01 DIAGNOSIS — E1151 Type 2 diabetes mellitus with diabetic peripheral angiopathy without gangrene: Secondary | ICD-10-CM | POA: Diagnosis not present

## 2023-10-01 DIAGNOSIS — Z87891 Personal history of nicotine dependence: Secondary | ICD-10-CM | POA: Diagnosis not present

## 2023-10-01 DIAGNOSIS — E785 Hyperlipidemia, unspecified: Secondary | ICD-10-CM | POA: Diagnosis not present

## 2023-10-01 DIAGNOSIS — E1169 Type 2 diabetes mellitus with other specified complication: Secondary | ICD-10-CM | POA: Diagnosis not present

## 2023-10-01 DIAGNOSIS — E1142 Type 2 diabetes mellitus with diabetic polyneuropathy: Secondary | ICD-10-CM | POA: Diagnosis not present

## 2023-10-01 DIAGNOSIS — Z905 Acquired absence of kidney: Secondary | ICD-10-CM | POA: Diagnosis not present

## 2023-10-01 DIAGNOSIS — Z7901 Long term (current) use of anticoagulants: Secondary | ICD-10-CM | POA: Diagnosis not present

## 2023-10-01 DIAGNOSIS — Z85528 Personal history of other malignant neoplasm of kidney: Secondary | ICD-10-CM | POA: Diagnosis not present

## 2023-10-01 DIAGNOSIS — T8149XD Infection following a procedure, other surgical site, subsequent encounter: Secondary | ICD-10-CM | POA: Diagnosis not present

## 2023-10-01 DIAGNOSIS — Z556 Problems related to health literacy: Secondary | ICD-10-CM | POA: Diagnosis not present

## 2023-10-01 DIAGNOSIS — Z89422 Acquired absence of other left toe(s): Secondary | ICD-10-CM | POA: Diagnosis not present

## 2023-10-01 DIAGNOSIS — I2510 Atherosclerotic heart disease of native coronary artery without angina pectoris: Secondary | ICD-10-CM | POA: Diagnosis not present

## 2023-10-01 DIAGNOSIS — Z604 Social exclusion and rejection: Secondary | ICD-10-CM | POA: Diagnosis not present

## 2023-10-01 DIAGNOSIS — D649 Anemia, unspecified: Secondary | ICD-10-CM | POA: Diagnosis not present

## 2023-10-01 DIAGNOSIS — I6529 Occlusion and stenosis of unspecified carotid artery: Secondary | ICD-10-CM | POA: Diagnosis not present

## 2023-10-01 DIAGNOSIS — Z794 Long term (current) use of insulin: Secondary | ICD-10-CM | POA: Diagnosis not present

## 2023-10-01 DIAGNOSIS — Z7984 Long term (current) use of oral hypoglycemic drugs: Secondary | ICD-10-CM | POA: Diagnosis not present

## 2023-10-01 DIAGNOSIS — Z9181 History of falling: Secondary | ICD-10-CM | POA: Diagnosis not present

## 2023-10-01 DIAGNOSIS — Z7982 Long term (current) use of aspirin: Secondary | ICD-10-CM | POA: Diagnosis not present

## 2023-10-04 DIAGNOSIS — I70219 Atherosclerosis of native arteries of extremities with intermittent claudication, unspecified extremity: Secondary | ICD-10-CM | POA: Insufficient documentation

## 2023-10-04 NOTE — Progress Notes (Unsigned)
 MRN : [id number]  Laura Reed is a 63 y.o. (05-13-1961) female who presents with chief complaint of check circulation.  History of Present Illness:   Patient returns to the office for follow-up regarding her left groin wound and graft infection.  She has no new complaints.  She denies fever or chills.  She believes her wound is completely healed.  She denies groin pain.  She denies any new claudication symptoms.  No outpatient medications have been marked as taking for the 10/05/23 encounter (Appointment) with Gilda Crease, Latina Craver, MD.    Past Medical History:  Diagnosis Date   Absence of kidney    left   Anxiety    Arthritis    Atherosclerosis of native arteries of extremities with intermittent claudication, bilateral legs (HCC)    Bladder cancer (HCC)    CHF (congestive heart failure) (HCC)    Complication of anesthesia    BP HAS  RUN LOW AFTER SURGERY-LUNGS FILLED UP WITH FLUID AFTER  LEG STENT SURGERY    Coronary artery disease    Diabetes mellitus    Family history of adverse reaction to anesthesia    Sister - PONV   GERD (gastroesophageal reflux disease)    OCC TUMS   Heart murmur    Hemorrhoid    History of methicillin resistant staphylococcus aureus (MRSA) 2007   Hypertension    Neuropathy    PVD (peripheral vascular disease) (HCC)    Thyroid nodule    right   Unspecified osteoarthritis, unspecified site    Urothelial carcinoma of kidney (HCC) 10/31/2014   INVASIVE UROTHELIAL CARCINOMA, LOW GRADE. T1, Nx.   Vitamin D deficiency, unspecified    Wears dentures    full upper and lower    Past Surgical History:  Procedure Laterality Date   AMPUTATION TOE     right (4th and 5th); left (great toe, 3rd)   AMPUTATION TOE Right 07/16/2018   Procedure: AMPUTATION TOE/MPJ right 2nd;  Surgeon: Linus Galas, DPM;  Location: [facility];  Service: Podiatry;  Laterality: Right;   APPLICATION OF WOUND VAC  Left 06/26/2023   Procedure: WOUND VAC EXCHANGE LEFT GROIN;  Surgeon: Renford Dills, MD;  Location: [facility];  Service: Vascular;  Laterality: Left;   APPLICATION OF WOUND VAC Left 06/29/2023   Procedure: WOUND VAC EXCHANGE LEFT GROIN;  Surgeon: Renford Dills, MD;  Location: [facility];  Service: Vascular;  Laterality: Left;   APPLICATION OF WOUND VAC Left 07/02/2023   Procedure: WOUND VAC EXCHANGE LEFT GROIN;  Surgeon: Annice Needy, MD;  Location: [facility];  Service: Vascular;  Laterality: Left;   APPLICATION OF WOUND VAC Left 07/06/2023   Procedure: WOUND VAC EXCHANGE LEFT GROIN;  Surgeon: Renford Dills, MD;  Location: [facility];  Service: Vascular;  Laterality: Left;   APPLICATION OF WOUND VAC Left 07/16/2023   Procedure: WOUND VAC EXCHANGE LEFT GROIN;  Surgeon: Renford Dills, MD;  Location: [facility];  Service: Vascular;  Laterality: Left;   APPLICATION OF WOUND VAC Left 07/20/2023   Procedure: WOUND VAC EXCHANGE;  Surgeon: Renford Dills, MD;  Location: [facility];  Service: Vascular;  Laterality: Left;   APPLICATION OF WOUND VAC Left 07/23/2023   Procedure: WOUND VAC EXCHANGE;  Surgeon: Renford Dills, MD;  Location: [facility];  Service: Vascular;  Laterality: Left;   APPLICATION OF WOUND VAC Left 07/13/2023   Procedure: WOUND VAC EXCHANGE LEFT GROIN;  Surgeon: Renford Dills, MD;  Location: [facility];  Service: Vascular;  Laterality: Left;   APPLICATION OF WOUND VAC Left 07/09/2023   Procedure: WOUND VAC EXCHANGE OF LEFT GROIN;  Surgeon: Annice Needy, MD;  Location: [facility];  Service: Vascular;  Laterality: Left;   ARTERIAL BYPASS SURGRY  2009, 2013 x 2   right leg , done in [place]   CARDIAC CATHETERIZATION     CAROTID ENDARTERECTOMY Right 01/2014   Dr Gilda Crease   CATARACT EXTRACTION W/PHACO Right 12/14/2014   Procedure: CATARACT EXTRACTION PHACO AND INTRAOCULAR LENS PLACEMENT (IOC);  Surgeon: Lia Hopping, MD;  Location: [facility];  Service: Ophthalmology;  Laterality:  Right;  [place]   00:38.6              AP        7.1                   CDE  2.76   CATARACT EXTRACTION W/PHACO Left 12/06/2019   Procedure: CATARACT EXTRACTION PHACO AND INTRAOCULAR LENS PLACEMENT (IOC) LEFT DIABETIC;  Surgeon: Galen Manila, MD;  Location: [facility];  Service: Ophthalmology;  Laterality: Left;  9.08 1:06.4   CESAREAN SECTION     CHOLECYSTECTOMY  03-03-12   Porcelain gallbladder, gallstones,  [place]   COLONOSCOPY W/ BIOPSIES  04/28/2012   Hyperplastic rectal polyps.   COLONOSCOPY WITH PROPOFOL N/A 04/02/2022   Procedure: COLONOSCOPY WITH PROPOFOL;  Surgeon: Earline Mayotte, MD;  Location: [facility];  Service: Endoscopy;  Laterality: N/A;   CORONARY ARTERY BYPASS GRAFT  2009   3 vessel   CYSTOSCOPY W/ RETROGRADES Right 09/01/2016   Procedure: CYSTOSCOPY WITH RETROGRADE PYELOGRAM;  Surgeon: Vanna Scotland, MD;  Location: [facility];  Service: Urology;  Laterality: Right;   CYSTOSCOPY W/ RETROGRADES Bilateral 03/19/2020   Procedure: CYSTOSCOPY WITH RETROGRADE PYELOGRAM;  Surgeon: Vanna Scotland, MD;  Location: [facility];  Service: Urology;  Laterality: Bilateral;   CYSTOSCOPY WITH BIOPSY N/A 03/19/2020   Procedure: CYSTOSCOPY WITH BIOPSY;  Surgeon: Vanna Scotland, MD;  Location: [facility];  Service: Urology;  Laterality: N/A;   EYE SURGERY     FEMORAL-POPLITEAL BYPASS GRAFT Left 04/29/2023   Procedure: BYPASS GRAFT FEMORAL-POPLITEAL ARTERY;  Surgeon: Renford Dills, MD;  Location: [facility];  Service: Vascular;  Laterality: Left;   HERNIA REPAIR  10-31-14   ventral, retro-rectus atrium mesh   INCISION AND DRAINAGE ABSCESS Left 06/24/2023   Procedure: INCISION AND DRAINAGE WITH SATORIUS FLAP;  Surgeon: Renford Dills, MD;  Location: [facility];  Service: Vascular;  Laterality: Left;   IRRIGATION AND DEBRIDEMENT FOOT Left 01/18/2019   Procedure: IRRIGATION AND DEBRIDEMENT FOOT;  Surgeon: Linus Galas, DPM;  Location: [facility];  Service: Podiatry;  Laterality: Left;    LOWER EXTREMITY ANGIOGRAPHY Left 12/10/2016   Procedure: Lower Extremity Angiography;  Surgeon: Renford Dills, MD;  Location: [facility];  Service: Cardiovascular;  Laterality: Left;   LOWER EXTREMITY ANGIOGRAPHY Left 02/02/2018   Procedure: LOWER EXTREMITY ANGIOGRAPHY;  Surgeon: Renford Dills, MD;  Location: [facility];  Service: Cardiovascular;  Laterality: Left;   LOWER EXTREMITY ANGIOGRAPHY Left 05/05/2018   Procedure:  LOWER EXTREMITY ANGIOGRAPHY;  Surgeon: Renford Dills, MD;  Location: [facility];  Service: Cardiovascular;  Laterality: Left;   LOWER EXTREMITY ANGIOGRAPHY Left 12/04/2020   Procedure: LOWER EXTREMITY ANGIOGRAPHY with Intervention;  Surgeon: Renford Dills, MD;  Location: [facility];  Service: Cardiovascular;  Laterality: Left;   LOWER EXTREMITY ANGIOGRAPHY Left 04/24/2023   Procedure: Lower Extremity Angiography;  Surgeon: Renford Dills, MD;  Location: [facility];  Service: Cardiovascular;  Laterality: Left;   LOWER EXTREMITY ANGIOGRAPHY Left 05/01/2023   Procedure: Lower Extremity Angiography;  Surgeon: Renford Dills, MD;  Location: [facility];  Service: Cardiovascular;  Laterality: Left;   NEPHRECTOMY Left 10-31-14   PERIPHERAL VASCULAR CATHETERIZATION Left 05/01/2015   Procedure: Lower Extremity Angiography;  Surgeon: Renford Dills, MD;  Location: [facility];  Service: Cardiovascular;  Laterality: Left;   PERIPHERAL VASCULAR CATHETERIZATION  05/01/2015   Procedure: Lower Extremity Intervention;  Surgeon: Renford Dills, MD;  Location: [facility];  Service: Cardiovascular;;   PERIPHERAL VASCULAR CATHETERIZATION Left 02/20/2015   Procedure: Pelvic Angiography;  Surgeon: Renford Dills, MD;  Location: [facility];  Service: Cardiovascular;  Laterality: Left;   TRANSMETATARSAL AMPUTATION Left 05/05/2023   Procedure: TRANSMETATARSAL AMPUTATION LEFT;   Surgeon: Gwyneth Revels, DPM;  Location: [facility];  Service: Orthopedics/Podiatry;  Laterality: Left;   TRANSURETHRAL RESECTION OF BLADDER TUMOR WITH MITOMYCIN-C N/A 09/01/2016   Procedure: TRANSURETHRAL RESECTION OF BLADDER TUMOR WITH MITOMYCIN-C;  Surgeon: Vanna Scotland, MD;  Location: [facility];  Service: Urology;  Laterality: N/A;    Social History Social History   Tobacco Use   Smoking status: Former    Current packs/day: 0.00    Average packs/day: 2.0 packs/day for 35.0 years (70.0 ttl pk-yrs)    Types: Cigarettes    Start date: 03/29/1978    Quit date: 03/29/2013    Years since quitting: 10.5   Smokeless tobacco: Never  Vaping Use   Vaping status: Former  Substance Use Topics   Alcohol use: Not Currently    Alcohol/week: 0.0 standard drinks of alcohol    Comment: LAST DRINK 2009   Drug use: Not Currently    Types: Cocaine    Comment: last used in 2007    Family History Family History  Problem Relation Age of Onset   Cancer Mother 45       Lung Cancer   Cancer Father 25       Lung Ca   Diabetes Son    Breast cancer Maternal Grandmother    Kidney cancer Neg Hx    Bladder Cancer Neg Hx    Prostate cancer Neg Hx     No Known Allergies   REVIEW OF SYSTEMS (Negative unless checked)  Constitutional: [] Weight loss  [] Fever  [] Chills Cardiac: [] Chest pain   [] Chest pressure   [] Palpitations   [] Shortness of breath when laying flat   [] Shortness of breath with exertion. Vascular:  [x] Pain in legs with walking   [] Pain in legs at rest  [] History of DVT   [] Phlebitis   [] Swelling in legs   [] Varicose veins   [] Non-healing ulcers Pulmonary:   [] Uses home oxygen   [] Productive cough   [] Hemoptysis   [] Wheeze  [] COPD   [] Asthma Neurologic:  [] Dizziness   [] Seizures   [] History of stroke   [] History of TIA  [] Aphasia   [] Vissual changes   [] Weakness or numbness in arm   [] Weakness or numbness in  leg Musculoskeletal:   [] Joint swelling   [] Joint pain   [] Low back  pain Hematologic:  [] Easy bruising  [] Easy bleeding   [] Hypercoagulable state   [] Anemic Gastrointestinal:  [] Diarrhea   [] Vomiting  [] Gastroesophageal reflux/heartburn   [] Difficulty swallowing. Genitourinary:  [] Chronic kidney disease   [] Difficult urination  [] Frequent urination   [] Blood in urine Skin:  [] Rashes   [] Ulcers  Psychological:  [] History of anxiety   []  History of major depression.  Physical Examination  There were no vitals filed for this visit. There is no height or weight on file to calculate BMI. Gen: WD/WN, NAD Head: [place]/AT, No temporalis wasting.  Ear/Nose/Throat: Hearing grossly intact, nares w/o erythema or drainage Eyes: PER, EOMI, sclera nonicteric.  Neck: Supple, no masses.  No bruit or JVD.  Pulmonary:  Good air movement, no audible wheezing, no use of accessory muscles.  Cardiac: RRR, normal S1, S2, no Murmurs. Vascular: Groin wound has now healed completely, no open wounds Vessel Right Left  Radial Palpable Palpable  PT Not Palpable Trace palpable  DP Not Palpable Not Palpable  Gastrointestinal: soft, non-distended. No guarding/no peritoneal signs.  Musculoskeletal: M/S 5/5 throughout.  No visible deformity.  Neurologic: CN 2-12 intact. Pain and light touch intact in extremities.  Symmetrical.  Speech is fluent. Motor exam as listed above. Psychiatric: Judgment intact, Mood & affect appropriate for pt's clinical situation. Dermatologic: No rashes or ulcers noted.  No changes consistent with cellulitis.   CBC Lab Results  Component Value Date   WBC 9.6 08/21/2023   HGB 12.4 08/21/2023   HCT 37.8 08/21/2023   MCV 84.3 08/21/2023   PLT 315.0 08/21/2023    BMET    Component Value Date/Time   NA 136 08/21/2023 1024   NA 136 08/02/2021 1629   NA 135 11/02/2014 0609   K 4.7 08/21/2023 1024   K 4.2 11/02/2014 0609   CL 101 08/21/2023 1024   CL 107 11/02/2014 0609   CO2 28 08/21/2023 1024   CO2 23 11/02/2014 0609   GLUCOSE 160 (H) 08/21/2023  1024   GLUCOSE 108 (H) 11/02/2014 0609   BUN 38 (H) 08/21/2023 1024   BUN 25 08/02/2021 1629   BUN 20 11/02/2014 0609   CREATININE 0.76 08/21/2023 1024   CREATININE 1.01 11/09/2015 1549   CREATININE 1.01 11/09/2015 1549   CALCIUM 9.0 08/21/2023 1024   CALCIUM 7.3 (L) 11/02/2014 0609   GFRNONAA >60 07/24/2023 0505   GFRNONAA 50 (L) 11/02/2014 0609   GFRAA >60 01/19/2019 0357   GFRAA 58 (L) 11/02/2014 0609   CrCl cannot be calculated (Patient's most recent lab result is older than the maximum 21 days allowed.).  COAG Lab Results  Component Value Date   INR 1.1 06/21/2023   INR 0.9 10/17/2014   INR 1.1 01/19/2014    Radiology No results found.   Assessment/Plan 1. Vascular graft infection, sequela (Primary) Her groin wound has now healed.  We discussed we will continue antibiotics indefinitely.  We will go ahead and schedule a duplex ultrasound at this time. - VAS [place] LOWER EXTREMITY ARTERIAL DUPLEX; Future  2. Atherosclerosis of native artery of both lower extremities with intermittent claudication (HCC)  Recommend:  The patient has evidence of atherosclerosis of the lower extremities with claudication.  The patient does not voice lifestyle limiting changes at this point in time.  Noninvasive studies do not suggest clinically significant change.  No invasive studies, angiography or surgery at this time The patient should continue walking and begin  a more formal exercise program.  The patient should continue antiplatelet therapy and aggressive treatment of the lipid abnormalities  No changes in the patient's medications at this time  Continued surveillance is indicated as atherosclerosis is likely to progress with time.    The patient will continue follow up with noninvasive studies as ordered.  - VAS [place] LOWER EXTREMITY ARTERIAL DUPLEX; Future - VAS [place] ABI WITH/WO TBI; Future  3. Primary hypertension Continue antihypertensive medications as already ordered, these  medications have been reviewed and there are no changes at this time.  4. Controlled type 2 DM with peripheral circulatory disorder (HCC) Continue hypoglycemic medications as already ordered, these medications have been reviewed and there are no changes at this time.  Hgb A1C to be monitored as already arranged by primary service  5. Hyperlipidemia associated with type 2 diabetes mellitus (HCC) Continue statin as ordered and reviewed, no changes at this time    [name], MD  10/04/2023 3:44 PM

## 2023-10-05 ENCOUNTER — Encounter (INDEPENDENT_AMBULATORY_CARE_PROVIDER_SITE_OTHER): Payer: Self-pay | Admitting: Vascular Surgery

## 2023-10-05 ENCOUNTER — Ambulatory Visit (INDEPENDENT_AMBULATORY_CARE_PROVIDER_SITE_OTHER): Admitting: Vascular Surgery

## 2023-10-05 VITALS — BP 145/71 | HR 66 | Resp 18 | Ht 67.0 in | Wt 148.2 lb

## 2023-10-05 DIAGNOSIS — E1169 Type 2 diabetes mellitus with other specified complication: Secondary | ICD-10-CM | POA: Diagnosis not present

## 2023-10-05 DIAGNOSIS — E785 Hyperlipidemia, unspecified: Secondary | ICD-10-CM

## 2023-10-05 DIAGNOSIS — T827XXS Infection and inflammatory reaction due to other cardiac and vascular devices, implants and grafts, sequela: Secondary | ICD-10-CM

## 2023-10-05 DIAGNOSIS — I10 Essential (primary) hypertension: Secondary | ICD-10-CM | POA: Diagnosis not present

## 2023-10-05 DIAGNOSIS — E1151 Type 2 diabetes mellitus with diabetic peripheral angiopathy without gangrene: Secondary | ICD-10-CM

## 2023-10-05 DIAGNOSIS — I70213 Atherosclerosis of native arteries of extremities with intermittent claudication, bilateral legs: Secondary | ICD-10-CM | POA: Diagnosis not present

## 2023-10-06 DIAGNOSIS — I5032 Chronic diastolic (congestive) heart failure: Secondary | ICD-10-CM | POA: Diagnosis not present

## 2023-10-06 DIAGNOSIS — Z604 Social exclusion and rejection: Secondary | ICD-10-CM | POA: Diagnosis not present

## 2023-10-06 DIAGNOSIS — Z89422 Acquired absence of other left toe(s): Secondary | ICD-10-CM | POA: Diagnosis not present

## 2023-10-06 DIAGNOSIS — D72829 Elevated white blood cell count, unspecified: Secondary | ICD-10-CM | POA: Diagnosis not present

## 2023-10-06 DIAGNOSIS — Z87891 Personal history of nicotine dependence: Secondary | ICD-10-CM | POA: Diagnosis not present

## 2023-10-06 DIAGNOSIS — Z556 Problems related to health literacy: Secondary | ICD-10-CM | POA: Diagnosis not present

## 2023-10-06 DIAGNOSIS — Z9181 History of falling: Secondary | ICD-10-CM | POA: Diagnosis not present

## 2023-10-06 DIAGNOSIS — Z7982 Long term (current) use of aspirin: Secondary | ICD-10-CM | POA: Diagnosis not present

## 2023-10-06 DIAGNOSIS — E785 Hyperlipidemia, unspecified: Secondary | ICD-10-CM | POA: Diagnosis not present

## 2023-10-06 DIAGNOSIS — Z8551 Personal history of malignant neoplasm of bladder: Secondary | ICD-10-CM | POA: Diagnosis not present

## 2023-10-06 DIAGNOSIS — I2510 Atherosclerotic heart disease of native coronary artery without angina pectoris: Secondary | ICD-10-CM | POA: Diagnosis not present

## 2023-10-06 DIAGNOSIS — Z7901 Long term (current) use of anticoagulants: Secondary | ICD-10-CM | POA: Diagnosis not present

## 2023-10-06 DIAGNOSIS — Z85528 Personal history of other malignant neoplasm of kidney: Secondary | ICD-10-CM | POA: Diagnosis not present

## 2023-10-06 DIAGNOSIS — E1151 Type 2 diabetes mellitus with diabetic peripheral angiopathy without gangrene: Secondary | ICD-10-CM | POA: Diagnosis not present

## 2023-10-06 DIAGNOSIS — I6529 Occlusion and stenosis of unspecified carotid artery: Secondary | ICD-10-CM | POA: Diagnosis not present

## 2023-10-06 DIAGNOSIS — Z794 Long term (current) use of insulin: Secondary | ICD-10-CM | POA: Diagnosis not present

## 2023-10-06 DIAGNOSIS — Z7984 Long term (current) use of oral hypoglycemic drugs: Secondary | ICD-10-CM | POA: Diagnosis not present

## 2023-10-06 DIAGNOSIS — T8149XD Infection following a procedure, other surgical site, subsequent encounter: Secondary | ICD-10-CM | POA: Diagnosis not present

## 2023-10-06 DIAGNOSIS — E1142 Type 2 diabetes mellitus with diabetic polyneuropathy: Secondary | ICD-10-CM | POA: Diagnosis not present

## 2023-10-06 DIAGNOSIS — D649 Anemia, unspecified: Secondary | ICD-10-CM | POA: Diagnosis not present

## 2023-10-06 DIAGNOSIS — I110 Hypertensive heart disease with heart failure: Secondary | ICD-10-CM | POA: Diagnosis not present

## 2023-10-06 DIAGNOSIS — Z905 Acquired absence of kidney: Secondary | ICD-10-CM | POA: Diagnosis not present

## 2023-10-06 DIAGNOSIS — E1169 Type 2 diabetes mellitus with other specified complication: Secondary | ICD-10-CM | POA: Diagnosis not present

## 2023-10-09 DIAGNOSIS — Z961 Presence of intraocular lens: Secondary | ICD-10-CM | POA: Diagnosis not present

## 2023-10-09 DIAGNOSIS — E113512 Type 2 diabetes mellitus with proliferative diabetic retinopathy with macular edema, left eye: Secondary | ICD-10-CM | POA: Diagnosis not present

## 2023-10-09 DIAGNOSIS — E113553 Type 2 diabetes mellitus with stable proliferative diabetic retinopathy, bilateral: Secondary | ICD-10-CM | POA: Diagnosis not present

## 2023-10-14 ENCOUNTER — Encounter: Payer: Self-pay | Admitting: Vascular Surgery

## 2023-10-15 ENCOUNTER — Encounter: Payer: Self-pay | Admitting: Vascular Surgery

## 2023-10-16 DIAGNOSIS — Z7982 Long term (current) use of aspirin: Secondary | ICD-10-CM | POA: Diagnosis not present

## 2023-10-16 DIAGNOSIS — Z7984 Long term (current) use of oral hypoglycemic drugs: Secondary | ICD-10-CM | POA: Diagnosis not present

## 2023-10-16 DIAGNOSIS — Z556 Problems related to health literacy: Secondary | ICD-10-CM | POA: Diagnosis not present

## 2023-10-16 DIAGNOSIS — E1169 Type 2 diabetes mellitus with other specified complication: Secondary | ICD-10-CM | POA: Diagnosis not present

## 2023-10-16 DIAGNOSIS — I2510 Atherosclerotic heart disease of native coronary artery without angina pectoris: Secondary | ICD-10-CM | POA: Diagnosis not present

## 2023-10-16 DIAGNOSIS — Z7901 Long term (current) use of anticoagulants: Secondary | ICD-10-CM | POA: Diagnosis not present

## 2023-10-16 DIAGNOSIS — E1151 Type 2 diabetes mellitus with diabetic peripheral angiopathy without gangrene: Secondary | ICD-10-CM | POA: Diagnosis not present

## 2023-10-16 DIAGNOSIS — D72829 Elevated white blood cell count, unspecified: Secondary | ICD-10-CM | POA: Diagnosis not present

## 2023-10-16 DIAGNOSIS — Z604 Social exclusion and rejection: Secondary | ICD-10-CM | POA: Diagnosis not present

## 2023-10-16 DIAGNOSIS — Z8551 Personal history of malignant neoplasm of bladder: Secondary | ICD-10-CM | POA: Diagnosis not present

## 2023-10-16 DIAGNOSIS — Z9181 History of falling: Secondary | ICD-10-CM | POA: Diagnosis not present

## 2023-10-16 DIAGNOSIS — I110 Hypertensive heart disease with heart failure: Secondary | ICD-10-CM | POA: Diagnosis not present

## 2023-10-16 DIAGNOSIS — E785 Hyperlipidemia, unspecified: Secondary | ICD-10-CM | POA: Diagnosis not present

## 2023-10-16 DIAGNOSIS — E1142 Type 2 diabetes mellitus with diabetic polyneuropathy: Secondary | ICD-10-CM | POA: Diagnosis not present

## 2023-10-16 DIAGNOSIS — D649 Anemia, unspecified: Secondary | ICD-10-CM | POA: Diagnosis not present

## 2023-10-16 DIAGNOSIS — Z905 Acquired absence of kidney: Secondary | ICD-10-CM | POA: Diagnosis not present

## 2023-10-16 DIAGNOSIS — I5032 Chronic diastolic (congestive) heart failure: Secondary | ICD-10-CM | POA: Diagnosis not present

## 2023-10-16 DIAGNOSIS — Z87891 Personal history of nicotine dependence: Secondary | ICD-10-CM | POA: Diagnosis not present

## 2023-10-16 DIAGNOSIS — Z794 Long term (current) use of insulin: Secondary | ICD-10-CM | POA: Diagnosis not present

## 2023-10-16 DIAGNOSIS — I6529 Occlusion and stenosis of unspecified carotid artery: Secondary | ICD-10-CM | POA: Diagnosis not present

## 2023-10-16 DIAGNOSIS — T8149XD Infection following a procedure, other surgical site, subsequent encounter: Secondary | ICD-10-CM | POA: Diagnosis not present

## 2023-10-16 DIAGNOSIS — Z85528 Personal history of other malignant neoplasm of kidney: Secondary | ICD-10-CM | POA: Diagnosis not present

## 2023-10-16 DIAGNOSIS — Z89422 Acquired absence of other left toe(s): Secondary | ICD-10-CM | POA: Diagnosis not present

## 2023-10-19 ENCOUNTER — Other Ambulatory Visit: Payer: Self-pay

## 2023-10-19 DIAGNOSIS — E1165 Type 2 diabetes mellitus with hyperglycemia: Secondary | ICD-10-CM

## 2023-10-19 MED ORDER — EMPAGLIFLOZIN 25 MG PO TABS: ORAL_TABLET | ORAL | 0 refills | Status: DC

## 2023-10-30 DIAGNOSIS — E1142 Type 2 diabetes mellitus with diabetic polyneuropathy: Secondary | ICD-10-CM | POA: Diagnosis not present

## 2023-11-02 ENCOUNTER — Other Ambulatory Visit: Payer: Self-pay

## 2023-11-02 MED ORDER — GABAPENTIN 300 MG PO CAPS: 300.0000 mg | ORAL_CAPSULE | Freq: Two times a day (BID) | ORAL | 0 refills | Status: DC

## 2023-11-02 NOTE — Telephone Encounter (Signed)
 Historical medication Last OV: 09/04/2023 Next OV: 11/04/2023

## 2023-11-04 ENCOUNTER — Encounter: Payer: Self-pay | Admitting: Internal Medicine

## 2023-11-04 ENCOUNTER — Ambulatory Visit: Payer: 59 | Admitting: Internal Medicine

## 2023-11-04 VITALS — BP 122/60 | HR 78 | Temp 97.8°F | Ht 67.0 in | Wt 147.8 lb

## 2023-11-04 DIAGNOSIS — Z794 Long term (current) use of insulin: Secondary | ICD-10-CM

## 2023-11-04 DIAGNOSIS — E1151 Type 2 diabetes mellitus with diabetic peripheral angiopathy without gangrene: Secondary | ICD-10-CM

## 2023-11-04 DIAGNOSIS — E785 Hyperlipidemia, unspecified: Secondary | ICD-10-CM | POA: Diagnosis not present

## 2023-11-04 DIAGNOSIS — I10 Essential (primary) hypertension: Secondary | ICD-10-CM

## 2023-11-04 DIAGNOSIS — E1169 Type 2 diabetes mellitus with other specified complication: Secondary | ICD-10-CM | POA: Diagnosis not present

## 2023-11-04 DIAGNOSIS — T827XXS Infection and inflammatory reaction due to other cardiac and vascular devices, implants and grafts, sequela: Secondary | ICD-10-CM

## 2023-11-04 DIAGNOSIS — E119 Type 2 diabetes mellitus without complications: Secondary | ICD-10-CM

## 2023-11-04 LAB — COMPREHENSIVE METABOLIC PANEL WITH GFR
ALT: 27 U/L (ref 0–35)
AST: 25 U/L (ref 0–37)
Albumin: 4 g/dL (ref 3.5–5.2)
Alkaline Phosphatase: 84 U/L (ref 39–117)
BUN: 31 mg/dL — ABNORMAL HIGH (ref 6–23)
CO2: 29 meq/L (ref 19–32)
Calcium: 9 mg/dL (ref 8.4–10.5)
Chloride: 101 meq/L (ref 96–112)
Creatinine, Ser: 0.92 mg/dL (ref 0.40–1.20)
GFR: 65.51 mL/min (ref >60.00)
Glucose, Bld: 133 mg/dL — ABNORMAL HIGH (ref 70–99)
Potassium: 4.6 meq/L (ref 3.5–5.1)
Sodium: 137 meq/L (ref 135–145)
Total Bilirubin: 0.4 mg/dL (ref 0.2–1.2)
Total Protein: 7.2 g/dL (ref 6.0–8.3)

## 2023-11-04 LAB — LDL CHOLESTEROL, DIRECT: Direct LDL: 61 mg/dL

## 2023-11-04 LAB — LIPID PANEL
Cholesterol: 146 mg/dL (ref 0–200)
HDL: 77.9 mg/dL (ref >39.00)
LDL Cholesterol: 55 mg/dL (ref 0–99)
NonHDL: 67.7
Total CHOL/HDL Ratio: 2
Triglycerides: 63 mg/dL (ref 0.0–149.0)
VLDL: 12.6 mg/dL (ref 0.0–40.0)

## 2023-11-04 LAB — HEMOGLOBIN A1C: Hgb A1c MFr Bld: 8.6 % — ABNORMAL HIGH (ref 4.6–6.5)

## 2023-11-04 LAB — MICROALBUMIN / CREATININE URINE RATIO
Creatinine,U: 20.1 mg/dL
Microalb Creat Ratio: 621.3 mg/g — ABNORMAL HIGH (ref 0.0–30.0)
Microalb, Ur: 12.5 mg/dL — ABNORMAL HIGH (ref 0.0–1.9)

## 2023-11-04 NOTE — Progress Notes (Signed)
 Laura Reed

## 2023-11-04 NOTE — Patient Instructions (Addendum)
 Your blood sugars are high AFTER DINNER AND INTO THE MORNING. BECAUSE OF YOUR SNACKS   STOP EATING COOKIES, EVEN DIABETIC COOKIES.  .  GO BACK TO DRINKING THE GLASS OF MILK FOR THE NEXT 2  WEEKS AND CONTINUE YOUR CURRENT DOSES OF INSULIN    RETURN IN 2 WEEKS TO REVIEW BLOOD SUGARS  PLEASE GO GET YOUR ORTHOTICS MADE BY [facility] TO AVOID PRESSURE ULCERS THAT MAY STILL OCCUR BECAUSE OF THE CHANGE IN YOUR FOOT ANATOMY

## 2023-11-04 NOTE — Progress Notes (Signed)
 Subjective:  Patient ID: Laura Reed, female    DOB: October 05, 1960  Age: 63 y.o. MRN: [id number]  CC: The primary encounter diagnosis was Primary hypertension. Diagnoses of Type II diabetes mellitus with peripheral circulatory disorder (HCC), Hyperlipidemia associated with type 2 diabetes mellitus (HCC), Vascular graft infection, sequela, and Insulin -requiring or dependent type II diabetes mellitus (HCC) were also pertinent to this visit.   HPI Laura Reed presents for  Chief Complaint  Patient presents with   Medical Management of Chronic Issues    2 month follow up     1) Type 2 DM ; TAKING MEALTIME INSULIN  USING A SLIDING SCALE. That was amended in February to address elevated evening sugars  WITH 8 UNITS FOR CBG < 200 , 12 UNITS FOR 200 , AND 14 UNITS IF PRE MEAL SUGAR IS 240 OR HIGHER  .  TRESIBA   was increased to 20 units at last visit . AND JARDIANCE  WAS CONTINUED.  [name]  SHE HAS GAINED  14 LBS SINCE SHE QUIT SMOKING 6 MONTHS AGO.,AFTER HER LAST AMPUTATION  I have downloaded and reviewed the data from patient's continuous blood glucose monitor. Patient's  sugars have been  IN RANGE   42  % OF THE TIME,   BELOW RANGE    0% of the time.  And ABOVE RANGE  58 % OF THE TIME .  Post prandial dinner readings have been quite labile and have ranged from 80 to 250 .  She has not  used the CBG monitor to keep  a food diary     2) PAD: THE LAST AMPUTATION INVOLVED THE LOSS OF 3 TOES ON THE LEFT FOOT .  HAS 2 TOES LEFT ON THE RIGHT.  DETERMINED NOT TO LOSE ANY MORE.  SHE IS NOT USING ANY ORTHOTICS DESPITE HAVING AN RX FOR ORTHOTICS AT [facility] .  She is stuffing socks into her sneakers .  Still having some leg swelling on the left  the compression socks are leaving a big dent in her upper calf   2) has not followed up with Charlanne Cong, her cardiologist,  since she was discharged   he has trouble understanding him when he speaks.    3) H/O VASCULAR GRAFT INFECTION:  healed,  but Dr Prescilla Brod has  conferred with ID and the plan is to continue augmentin  long term     Outpatient Medications Prior to Visit  Medication Sig Dispense Refill   acetaminophen  (TYLENOL ) 500 MG tablet Take 1,000 mg by mouth every 6 (six) hours as needed for mild pain or headache.      acidophilus (RISAQUAD) CAPS capsule Take 2 capsules by mouth 2 (two) times daily.     amoxicillin -clavulanate (AUGMENTIN ) 875-125 MG tablet Take 1 tablet by mouth every 12 (twelve) hours. 28 tablet 6   apixaban  (ELIQUIS ) 2.5 MG TABS tablet Take 1 tablet (2.5 mg total) by mouth 2 (two) times daily. 60 tablet 11   aspirin  81 MG chewable tablet Chew 81 mg by mouth at bedtime.      atorvastatin  (LIPITOR) 20 MG tablet TAKE 1 TABLET(20 MG) BY MOUTH DAILY 90 tablet 1   Cholecalciferol  (VITAMIN D3) 2000 UNITS TABS Take 2,000 Units by mouth daily after supper.      co-enzyme Q-10 30 MG capsule Take 30 mg by mouth daily.     Continuous Glucose Sensor (FREESTYLE LIBRE 2 SENSOR) MISC Use to check sugar at least 4 times daily 2 each 2   empagliflozin  (JARDIANCE ) 25 MG  TABS tablet TAKE 1 TABLET(25 MG) BY MOUTH DAILY BEFORE AND BREAKFAST 90 tablet 0   gabapentin  (NEURONTIN ) 300 MG capsule Take 1 capsule (300 mg total) by mouth 2 (two) times daily. 180 capsule 0   glucose blood test strip Use you check blood sugars up to four times daily. 100 each 12   insulin  degludec (TRESIBA  FLEXTOUCH) 100 UNIT/ML FlexTouch Pen Inject 15 Units into the skin daily. Reduced from 22 units daily.     insulin  lispro (HUMALOG  KWIKPEN) 100 UNIT/ML KwikPen ADMINISTER 5-10 UNITS UNDER THE SKIN THREE TIMES DAILY if you eat a meal.  5 units if blood sugar <200, 10 units if blood sugar >200. 15 mL 1   Insulin  Pen Needle (PEN NEEDLES) 32G X 4 MM MISC Use to take insulin  daily 100 each 3   losartan  (COZAAR ) 50 MG tablet Take 50 mg by mouth daily.     mometasone  (ELOCON ) 0.1 % cream APPLY TWICE DAILY TO EAR CANAL AS NEEDED FOR ITCHING 15 g 1   Multiple Vitamin (MULTIVITAMIN)  tablet Take 1 tablet by mouth daily.     oxyCODONE -acetaminophen  (PERCOCET/ROXICET) 5-325 MG tablet Take 1 tablet by mouth daily as needed (with wound vac exchanges). 15 tablet 0   polycarbophil (FIBERCON) 625 MG tablet Take 1 tablet (625 mg total) by mouth 2 (two) times daily. For stool bulking. 60 tablet 0   triamcinolone  ointment (KENALOG ) 0.5 % Apply 1 Application topically 2 (two) times daily. 80 g 0   No facility-administered medications prior to visit.    Review of Systems;  Patient denies headache, fevers, malaise, unintentional weight loss, skin rash, eye pain, sinus congestion and sinus pain, sore throat, dysphagia,  hemoptysis , cough, dyspnea, wheezing, chest pain, palpitations, orthopnea, edema, abdominal pain, nausea, melena, diarrhea, constipation, flank pain, dysuria, hematuria, urinary  Frequency, nocturia, numbness, tingling, seizures,  Focal weakness, Loss of consciousness,  Tremor, insomnia, depression, anxiety, and suicidal ideation.      Objective:  BP 122/60   Pulse 78   Temp 97.8 F (36.6 C) (Oral)   Ht 5\' 7"  (1.702 m)   Wt 147 lb 12.8 oz (67 kg)   SpO2 98%   BMI 23.15 kg/m   BP Readings from Last 3 Encounters:  11/04/23 122/60  10/05/23 (!) 145/71  09/14/23 (!) 147/64    Wt Readings from Last 3 Encounters:  11/04/23 147 lb 12.8 oz (67 kg)  10/05/23 148 lb 3.2 oz (67.2 kg)  09/14/23 145 lb (65.8 kg)    Physical Exam Vitals reviewed.  Constitutional:      General: She is not in acute distress.    Appearance: Normal appearance. She is normal weight. She is not ill-appearing, toxic-appearing or diaphoretic.  HENT:     Head: Normocephalic.  Eyes:     General: No scleral icterus.       Right eye: No discharge.        Left eye: No discharge.     Conjunctiva/sclera: Conjunctivae normal.  Cardiovascular:     Rate and Rhythm: Normal rate and regular rhythm.     Heart sounds: Normal heart sounds.  Pulmonary:     Effort: Pulmonary effort is normal. No  respiratory distress.     Breath sounds: Normal breath sounds.  Musculoskeletal:        General: Normal range of motion.  Skin:    General: Skin is warm and dry.  Neurological:     General: No focal deficit present.     Mental Status:  She is alert and oriented to person, place, and time. Mental status is at baseline.  Psychiatric:        Mood and Affect: Mood normal.        Behavior: Behavior normal.        Thought Content: Thought content normal.        Judgment: Judgment normal.    Lab Results  Component Value Date   HGBA1C 8.6 (H) 11/04/2023   HGBA1C 6.9 (A) 08/06/2023   HGBA1C 5.8 05/19/2023    Lab Results  Component Value Date   CREATININE 0.92 11/04/2023   CREATININE 0.76 08/21/2023   CREATININE 0.79 07/24/2023    Lab Results  Component Value Date   WBC 9.6 08/21/2023   HGB 12.4 08/21/2023   HCT 37.8 08/21/2023   PLT 315.0 08/21/2023   GLUCOSE 133 (H) 11/04/2023   CHOL 146 11/04/2023   TRIG 63.0 11/04/2023   HDL 77.90 11/04/2023   LDLDIRECT 61.0 11/04/2023   LDLCALC 55 11/04/2023   ALT 27 11/04/2023   AST 25 11/04/2023   NA 137 11/04/2023   K 4.6 11/04/2023   CL 101 11/04/2023   CREATININE 0.92 11/04/2023   BUN 31 (H) 11/04/2023   CO2 29 11/04/2023   TSH 0.864 06/22/2023   INR 1.1 06/21/2023   HGBA1C 8.6 (H) 11/04/2023   MICROALBUR 12.5 (H) 11/04/2023    ECHOCARDIOGRAM COMPLETE Result Date: 06/25/2023    ECHOCARDIOGRAM REPORT   Patient Name:   CHARLEI MESSIMER Date of Exam: 06/24/2023 Medical Rec #:  [id number]       Height:       67.0 in Accession #:    [id number]      Weight:       141.4 lb Date of Birth:  15-Dec-1960       BSA:          1.745 m Patient Age:    62 years        BP:           107/74 mmHg Patient Gender: F               HR:           65 bpm. Exam Location:  [facility] Procedure: 2D Echo, Cardiac Doppler and Color Doppler Indications:     I48.91 Atrial Fibrillation.  History:         Patient has no prior history of Echocardiogram examinations.                   CHF, CAD, Signs/Symptoms:Murmur; Risk Factors:Hypertension and                  Diabetes.  Sonographer:     Brigid Canada RDCS Referring Phys:  [id number] Ninette Basque [name] Diagnosing Phys: Belva Boyden MD IMPRESSIONS  1. Left ventricular ejection fraction, by estimation, is 35 to 40%. The left ventricle has moderately decreased function. The left ventricle demonstrates global hypokinesis. The left ventricular internal cavity size was mildly dilated. Left ventricular diastolic parameters are indeterminate.  2. Right ventricular systolic function is normal. The right ventricular size is normal. There is mildly elevated pulmonary artery systolic pressure. The estimated right ventricular systolic pressure is 42.5 mmHg.  3. The mitral valve is normal in structure. Mild to moderate mitral valve regurgitation. No evidence of mitral stenosis.  4. Tricuspid valve regurgitation is mild to moderate.  5. The aortic valve is calcified. There is moderate calcification of the aortic valve. Aortic valve regurgitation  is not visualized. Aortic valve sclerosis/calcification is present, without any evidence of aortic stenosis.  6. The inferior vena cava is dilated in size with >50% respiratory variability, suggesting right atrial pressure of 8 mmHg. FINDINGS  Left Ventricle: Left ventricular ejection fraction, by estimation, is 35 to 40%. The left ventricle has moderately decreased function. The left ventricle demonstrates global hypokinesis. The left ventricular internal cavity size was mildly dilated. There is no left ventricular hypertrophy. Left ventricular diastolic parameters are indeterminate. Right Ventricle: The right ventricular size is normal. No increase in right ventricular wall thickness. Right ventricular systolic function is normal. There is mildly elevated pulmonary artery systolic pressure. The tricuspid regurgitant velocity is 2.85  m/s, and with an assumed right atrial pressure of 10 mmHg, the  estimated right ventricular systolic pressure is 42.5 mmHg. Left Atrium: Left atrial size was normal in size. Right Atrium: Right atrial size was normal in size. Pericardium: There is no evidence of pericardial effusion. Mitral Valve: The mitral valve is normal in structure. Mild to moderate mitral valve regurgitation. No evidence of mitral valve stenosis. Tricuspid Valve: The tricuspid valve is normal in structure. Tricuspid valve regurgitation is mild to moderate. No evidence of tricuspid stenosis. Aortic Valve: The aortic valve is calcified. There is moderate calcification of the aortic valve. Aortic valve regurgitation is not visualized. Aortic valve sclerosis/calcification is present, without any evidence of aortic stenosis. Pulmonic Valve: The pulmonic valve was normal in structure. Pulmonic valve regurgitation is not visualized. No evidence of pulmonic stenosis. Aorta: The aortic root is normal in size and structure. Venous: The inferior vena cava is dilated in size with greater than 50% respiratory variability, suggesting right atrial pressure of 8 mmHg. IAS/Shunts: No atrial level shunt detected by color flow Doppler.  LEFT VENTRICLE PLAX 2D LVIDd:         5.90 cm   Diastology LVIDs:         4.60 cm   LV e' medial:    5.82 cm/s LV PW:         0.80 cm   LV E/e' medial:  25.1 LV IVS:        0.50 cm   LV e' lateral:   7.51 cm/s LVOT diam:     1.80 cm   LV E/e' lateral: 19.4 LV SV:         50 LV SV Index:   29 LVOT Area:     2.54 cm  RIGHT VENTRICLE            IVC RV Basal diam:  4.20 cm    IVC diam: 2.20 cm RV S prime:     8.98 cm/s TAPSE (M-mode): 2.4 cm LEFT ATRIUM             Index        RIGHT ATRIUM           Index LA diam:        4.10 cm 2.35 cm/m   RA Area:     13.20 cm LA Vol (A2C):   68.0 ml 38.96 ml/m  RA Volume:   29.20 ml  16.73 ml/m LA Vol (A4C):   47.6 ml 27.28 ml/m LA Biplane Vol: 58.8 ml 33.69 ml/m  AORTIC VALVE LVOT Vmax:   83.15 cm/s LVOT Vmean:  55.750 cm/s LVOT VTI:    0.196 m  AORTA  Ao Root diam: 2.50 cm MITRAL VALVE  TRICUSPID VALVE MV Area (PHT): 3.68 cm     TR Peak grad:   32.5 mmHg MV Decel Time: 206 msec     TR Vmax:        285.00 cm/s MV E velocity: 146.00 cm/s MV A velocity: 79.00 cm/s   SHUNTS MV E/A ratio:  1.85         Systemic VTI:  0.20 m                             Systemic Diam: 1.80 cm Belva Boyden MD Electronically signed by Belva Boyden MD Signature Date/Time: 06/25/2023/5:07:32 PM    Final     Assessment & Plan:  .Primary hypertension -     Comprehensive metabolic panel with GFR -     Microalbumin / creatinine urine ratio  Type II diabetes mellitus with peripheral circulatory disorder (HCC) Assessment & Plan: Conplicated by neuropathy. 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 Her  acquired deformities of both feet require use of orthotics to prevent pressure ulcers on the remaining feet;  she was strongly encouaged  to contact [facility]   Orders: -     Comprehensive metabolic panel with GFR -     Hemoglobin A1c -     Microalbumin / creatinine urine ratio  Hyperlipidemia associated with type 2 diabetes mellitus (HCC) -     Lipid panel -     LDL cholesterol, direct  Vascular graft infection, sequela Assessment & Plan: Resolved per vascular surgery but will require lifelong antibiotics to prevent recurrence    Insulin -requiring or dependent type II diabetes mellitus (HCC) Assessment & Plan: Uncontrolled currently,  attributed to increased appetite and intake of junk food since she quit smoking. Her evening blood sugars  have ben quite labile and she has  a history of severe hypoglycemia in the past which  makes her understandably apprehensive about taking larger doses of f mealtime insulin .  I will advise her to increase  Tresiba  to 25 units and  continue the current  humalog  sliding scale to 8 units for pre dinner CBG 150 or higher  10 units ofr  CBG 200 or higher, and 14 units for CBG 240 or higher  and return in 2 weeks .       Follow-up: Return in about 2 weeks (around 11/18/2023) for follow up diabetes.    In addition to time spend reviewing her blood sugars,  I  provided 30 minutes of face-to-face time during this encounter reviewing patient's last visit with vascular surgery, ID and cardiology,  ,  recent surgical and non surgical procedures, previous  labs and imaging studies, counseling on diabetes management ,  and post visit ordering to diagnostics and therapeutics .    Thersia Flax, MD

## 2023-11-05 ENCOUNTER — Encounter: Payer: Self-pay | Admitting: Internal Medicine

## 2023-11-05 NOTE — Assessment & Plan Note (Signed)
 Resolved per vascular surgery but will require lifelong antibiotics to prevent recurrence

## 2023-11-05 NOTE — Assessment & Plan Note (Addendum)
 Uncontrolled currently,  attributed to increased appetite and intake of junk food since she quit smoking. Her evening blood sugars  have ben quite labile and she has  a history of severe hypoglycemia in the past which  makes her understandably apprehensive about taking larger doses of f mealtime insulin .  I will advise her to increase  Tresiba  to 25 units and  continue the current  humalog  sliding scale to 8 units for pre dinner CBG 150 or higher  10 units ofr CBG 200 or higher, and 14 units for CBG 240 or higher  and return in 2 weeks .

## 2023-11-05 NOTE — Assessment & Plan Note (Signed)
 Conplicated by neuropathy. 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 Her  acquired deformities of both feet require use of orthotics to prevent pressure ulcers on the remaining feet;  she was strongly encouaged  to contact [facility]

## 2023-11-12 ENCOUNTER — Encounter (HOSPITAL_COMMUNITY): Payer: Self-pay

## 2023-11-15 NOTE — Progress Notes (Unsigned)
 MRN : [id number]  Laura Reed is a 63 y.o. ([birth year]) female who presents with chief complaint of check circulation.  History of Present Illness:   The patient returns to the office for followup regarding atherosclerotic changes of the lower extremities and review of the noninvasive studies.   There have been no interval changes in lower extremity symptoms. No interval shortening of the patient's claudication distance or development of rest pain symptoms. No new ulcers or wounds have occurred since the last visit.  There have been no significant changes to the patient's overall health care.  The patient denies amaurosis fugax or recent TIA symptoms. There are no documented recent neurological changes noted. There is no history of DVT, PE or superficial thrombophlebitis. The patient denies recent episodes of angina or shortness of breath.   ABI Rt=*** and Lt=***  (previous ABI's Rt=*** and Lt=***) Duplex ultrasound of the ***   No outpatient medications have been marked as taking for the 11/16/23 encounter (Appointment) with Prescilla Brod, Ninette Basque, MD.    Past Medical History:  Diagnosis Date   Absence of kidney    left   Anxiety    Arthritis    Atherosclerosis of native arteries of extremities with intermittent claudication, bilateral legs (HCC)    Bladder cancer (HCC)    CHF (congestive heart failure) (HCC)    Complication of anesthesia    BP HAS  RUN LOW AFTER SURGERY-LUNGS FILLED UP WITH FLUID AFTER  LEG STENT SURGERY    Coronary artery disease    Diabetes mellitus    Family history of adverse reaction to anesthesia    Sister - PONV   GERD (gastroesophageal reflux disease)    OCC TUMS   Heart murmur    Hemorrhoid    History of methicillin resistant staphylococcus aureus (MRSA) 2007   Hypertension    Neuropathy    PVD (peripheral vascular disease) (HCC)    Thyroid  nodule    right   Unspecified  osteoarthritis, unspecified site    Urothelial carcinoma of kidney (HCC) 10/31/2014   INVASIVE UROTHELIAL CARCINOMA, LOW GRADE. T1, Nx.   Vitamin D  deficiency, unspecified    Wears dentures    full upper and lower    Past Surgical History:  Procedure Laterality Date   AMPUTATION TOE     right (4th and 5th); left (great toe, 3rd)   AMPUTATION TOE Right 07/16/2018   Procedure: AMPUTATION TOE/MPJ right 2nd;  Surgeon: Angel Barba, DPM;  Location: [facility];  Service: Podiatry;  Laterality: Right;   APPLICATION OF WOUND VAC Left 06/26/2023   Procedure: WOUND VAC EXCHANGE LEFT GROIN;  Surgeon: Jackquelyn Mass, MD;  Location: [facility];  Service: Vascular;  Laterality: Left;   APPLICATION OF WOUND VAC Left 06/29/2023   Procedure: WOUND VAC EXCHANGE LEFT GROIN;  Surgeon: Jackquelyn Mass, MD;  Location: [facility];  Service: Vascular;  Laterality: Left;   APPLICATION OF WOUND VAC Left 07/02/2023   Procedure: WOUND VAC EXCHANGE LEFT GROIN;  Surgeon: Celso College, MD;  Location: [facility];  Service: Vascular;  Laterality: Left;   APPLICATION  OF WOUND VAC Left 07/06/2023   Procedure: WOUND VAC EXCHANGE LEFT GROIN;  Surgeon: Jackquelyn Mass, MD;  Location: [facility];  Service: Vascular;  Laterality: Left;   APPLICATION OF WOUND VAC Left 07/16/2023   Procedure: WOUND VAC EXCHANGE LEFT GROIN;  Surgeon: Jackquelyn Mass, MD;  Location: [facility];  Service: Vascular;  Laterality: Left;   APPLICATION OF WOUND VAC Left 07/23/2023   Procedure: WOUND VAC EXCHANGE;  Surgeon: Jackquelyn Mass, MD;  Location: [facility];  Service: Vascular;  Laterality: Left;   APPLICATION OF WOUND VAC Left 07/13/2023   Procedure: WOUND VAC EXCHANGE LEFT GROIN;  Surgeon: Jackquelyn Mass, MD;  Location: [facility];  Service: Vascular;  Laterality: Left;   APPLICATION OF WOUND VAC Left 07/09/2023   Procedure: WOUND VAC EXCHANGE OF LEFT GROIN;  Surgeon: Celso College, MD;  Location: [facility];  Service: Vascular;  Laterality: Left;    APPLICATION OF WOUND VAC Left 07/20/2023   Procedure: WOUND VAC EXCHANGE;  Surgeon: Jackquelyn Mass, MD;  Location: [facility];  Service: Vascular;  Laterality: Left;   ARTERIAL BYPASS SURGRY  2009, 2013 x 2   right leg , done in [place]   CARDIAC CATHETERIZATION     CAROTID ENDARTERECTOMY Right 01/2014   Dr Prescilla Brod   CATARACT EXTRACTION W/PHACO Right 12/14/2014   Procedure: CATARACT EXTRACTION PHACO AND INTRAOCULAR LENS PLACEMENT (IOC);  Surgeon: Welby Hale, MD;  Location: [facility];  Service: Ophthalmology;  Laterality: Right;  US    00:38.6              AP        7.1                   CDE  2.76   CATARACT EXTRACTION W/PHACO Left 12/06/2019   Procedure: CATARACT EXTRACTION PHACO AND INTRAOCULAR LENS PLACEMENT (IOC) LEFT DIABETIC;  Surgeon: Clair Crews, MD;  Location: [facility];  Service: Ophthalmology;  Laterality: Left;  9.08 1:06.4   CESAREAN SECTION     CHOLECYSTECTOMY  03-03-12   Porcelain gallbladder, gallstones,  [place]   COLONOSCOPY W/ BIOPSIES  04/28/2012   Hyperplastic rectal polyps.   COLONOSCOPY WITH PROPOFOL  N/A 04/02/2022   Procedure: COLONOSCOPY WITH PROPOFOL ;  Surgeon: Marshall Skeeter, MD;  Location[facility];  Service: Endoscopy;  Laterality: N/A;   CORONARY ARTERY BYPASS GRAFT  2009   3 vessel   CYSTOSCOPY W/ RETROGRADES Right 09/01/2016   Procedure: CYSTOSCOPY WITH RETROGRADE PYELOGRAM;  Surgeon: Dustin Gimenez, MD;  Location: [facility];  Service: Urology;  Laterality: Right;   CYSTOSCOPY W/ RETROGRADES Bilateral 03/19/2020   Procedure: CYSTOSCOPY WITH RETROGRADE PYELOGRAM;  Surgeon: Dustin Gimenez, MD;  Location: [facility];  Service: Urology;  Laterality: Bilateral;   CYSTOSCOPY WITH BIOPSY N/A 03/19/2020   Procedure: CYSTOSCOPY WITH BIOPSY;  Surgeon: Dustin Gimenez, MD;  Location: [facility];  Service: Urology;  Laterality: N/A;   EYE SURGERY     FEMORAL-POPLITEAL BYPASS GRAFT Left 04/29/2023   Procedure: BYPASS GRAFT FEMORAL-POPLITEAL ARTERY;   Surgeon: Jackquelyn Mass, MD;  Location: [facility];  Service: Vascular;  Laterality: Left;   HERNIA REPAIR  10-31-14   ventral, retro-rectus atrium mesh   INCISION AND DRAINAGE ABSCESS Left 06/24/2023   Procedure: INCISION AND DRAINAGE WITH SATORIUS FLAP;  Surgeon: Jackquelyn Mass, MD;  Location: [facility];  Service: Vascular;  Laterality: Left;   IRRIGATION AND DEBRIDEMENT FOOT Left 01/18/2019   Procedure: IRRIGATION AND DEBRIDEMENT FOOT;  Surgeon: Angel Barba, DPM;  Location: [facility];  Service: Podiatry;  Laterality: Left;   LOWER EXTREMITY ANGIOGRAPHY Left 12/10/2016   Procedure: Lower Extremity Angiography;  Surgeon: Jackquelyn Mass, MD;  Location: [facility];  Service: Cardiovascular;  Laterality: Left;   LOWER EXTREMITY ANGIOGRAPHY Left 02/02/2018   Procedure: LOWER EXTREMITY ANGIOGRAPHY;  Surgeon: Jackquelyn Mass, MD;  Location: [facility];  Service: Cardiovascular;  Laterality: Left;   LOWER EXTREMITY ANGIOGRAPHY Left 05/05/2018   Procedure: LOWER EXTREMITY ANGIOGRAPHY;  Surgeon: Jackquelyn Mass, MD;  Location: [facility];  Service: Cardiovascular;  Laterality: Left;   LOWER EXTREMITY ANGIOGRAPHY Left 12/04/2020   Procedure: LOWER EXTREMITY ANGIOGRAPHY with Intervention;  Surgeon: Jackquelyn Mass, MD;  Location: [facility];  Service: Cardiovascular;  Laterality: Left;   LOWER EXTREMITY ANGIOGRAPHY Left 04/24/2023   Procedure: Lower Extremity Angiography;  Surgeon: Jackquelyn Mass, MD;  Location: [facility];  Service: Cardiovascular;  Laterality: Left;   LOWER EXTREMITY ANGIOGRAPHY Left 05/01/2023   Procedure: Lower Extremity Angiography;  Surgeon: Jackquelyn Mass, MD;  Location: [facility];  Service: Cardiovascular;  Laterality: Left;   NEPHRECTOMY Left 10-31-14   PERIPHERAL VASCULAR CATHETERIZATION Left 05/01/2015   Procedure: Lower Extremity Angiography;  Surgeon: Jackquelyn Mass, MD;  Location: [facility];  Service: Cardiovascular;  Laterality: Left;   PERIPHERAL VASCULAR CATHETERIZATION  05/01/2015   Procedure: Lower Extremity Intervention;  Surgeon: Jackquelyn Mass, MD;  Location: [facility];  Service: Cardiovascular;;   PERIPHERAL VASCULAR CATHETERIZATION Left 02/20/2015   Procedure: Pelvic Angiography;  Surgeon: Jackquelyn Mass, MD;  Location: [facility];  Service: Cardiovascular;  Laterality: Left;   TRANSMETATARSAL AMPUTATION Left 05/05/2023   Procedure: TRANSMETATARSAL AMPUTATION LEFT;  Surgeon: Anell Baptist, DPM;  Location: [facility];  Service: Orthopedics/Podiatry;  Laterality: Left;   TRANSURETHRAL RESECTION OF BLADDER TUMOR WITH MITOMYCIN -C N/A 09/01/2016   Procedure: TRANSURETHRAL RESECTION OF BLADDER TUMOR WITH MITOMYCIN -C;  Surgeon: Dustin Gimenez, MD;  Location: [facility];  Service: Urology;  Laterality: N/A;    Social History Social History   Tobacco Use   Smoking status: Former    Current packs/day: 0.00    Average packs/day: 2.0 packs/day for 35.0 years (70.0 ttl pk-yrs)    Types: Cigarettes    Start date: 03/29/1978    Quit date: 03/29/2013    Years since quitting: 10.6   Smokeless tobacco: Never  Vaping Use   Vaping status: Former  Substance Use Topics   Alcohol use: Not Currently    Alcohol/week: 0.0 standard drinks of alcohol    Comment: LAST DRINK 2009   Drug use: Not Currently    Types: Cocaine    Comment: last used in 2007    Family History Family History  Problem Relation Age of Onset   Cancer Mother 93       Lung Cancer   Cancer Father 42       Lung Ca   Diabetes Son    Breast cancer Maternal Grandmother    Kidney cancer Neg Hx    Bladder Cancer Neg Hx    Prostate cancer Neg Hx     No Known Allergies   REVIEW OF SYSTEMS (Negative unless checked)  Constitutional: [] Weight loss  [] Fever  [] Chills Cardiac: [] Chest pain   [] Chest pressure   [] Palpitations   [] Shortness of breath when laying flat   [] Shortness of  breath with exertion. Vascular:  [x] Pain in legs with walking   []   Pain in legs at rest  [] History of DVT   [] Phlebitis   [] Swelling in legs   [] Varicose veins   [] Non-healing ulcers Pulmonary:   [] Uses home oxygen   [] Productive cough   [] Hemoptysis   [] Wheeze  [] COPD   [] Asthma Neurologic:  [] Dizziness   [] Seizures   [] History of stroke   [] History of TIA  [] Aphasia   [] Vissual changes   [] Weakness or numbness in arm   [] Weakness or numbness in leg Musculoskeletal:   [] Joint swelling   [] Joint pain   [] Low back pain Hematologic:  [] Easy bruising  [] Easy bleeding   [] Hypercoagulable state   [] Anemic Gastrointestinal:  [] Diarrhea   [] Vomiting  [] Gastroesophageal reflux/heartburn   [] Difficulty swallowing. Genitourinary:  [] Chronic kidney disease   [] Difficult urination  [] Frequent urination   [] Blood in urine Skin:  [] Rashes   [] Ulcers  Psychological:  [] History of anxiety   []  History of major depression.  Physical Examination  There were no vitals filed for this visit. There is no height or weight on file to calculate BMI. Gen: WD/WN, NAD Head: [place]/AT, No temporalis wasting.  Ear/Nose/Throat: Hearing grossly intact, nares w/o erythema or drainage Eyes: PER, EOMI, sclera nonicteric.  Neck: Supple, no masses.  No bruit or JVD.  Pulmonary:  Good air movement, no audible wheezing, no use of accessory muscles.  Cardiac: RRR, normal S1, S2, no Murmurs. Vascular:  mild trophic changes, no open wounds Vessel Right Left  Radial Palpable Palpable  PT Not Palpable Not Palpable  DP Not Palpable Not Palpable  Gastrointestinal: soft, non-distended. No guarding/no peritoneal signs.  Musculoskeletal: M/S 5/5 throughout.  No visible deformity.  Neurologic: CN 2-12 intact. Pain and light touch intact in extremities.  Symmetrical.  Speech is fluent. Motor exam as listed above. Psychiatric: Judgment intact, Mood & affect appropriate for pt's clinical situation. Dermatologic: No rashes or ulcers noted.  No  changes consistent with cellulitis.   CBC Lab Results  Component Value Date   WBC 9.6 08/21/2023   HGB 12.4 08/21/2023   HCT 37.8 08/21/2023   MCV 84.3 08/21/2023   PLT 315.0 08/21/2023    BMET    Component Value Date/Time   NA 137 11/04/2023 1147   NA 136 08/02/2021 1629   NA 135 11/02/2014 0609   K 4.6 11/04/2023 1147   K 4.2 11/02/2014 0609   CL 101 11/04/2023 1147   CL 107 11/02/2014 0609   CO2 29 11/04/2023 1147   CO2 23 11/02/2014 0609   GLUCOSE 133 (H) 11/04/2023 1147   GLUCOSE 108 (H) 11/02/2014 0609   BUN 31 (H) 11/04/2023 1147   BUN 25 08/02/2021 1629   BUN 20 11/02/2014 0609   CREATININE 0.92 11/04/2023 1147   CREATININE 1.01 11/09/2015 1549   CREATININE 1.01 11/09/2015 1549   CALCIUM  9.0 11/04/2023 1147   CALCIUM  7.3 (L) 11/02/2014 0609   GFRNONAA >60 07/24/2023 0505   GFRNONAA 50 (L) 11/02/2014 0609   GFRAA >60 01/19/2019 0357   GFRAA 58 (L) 11/02/2014 0609   Estimated Creatinine Clearance: 59.3 mL/min (by C-G formula based on SCr of 0.92 mg/dL).  COAG Lab Results  Component Value Date   INR 1.1 06/21/2023   INR 0.9 10/17/2014   INR 1.1 01/19/2014    Radiology No results found.   Assessment/Plan There are no diagnoses linked to this encounter.   Devon Fogo, MD  11/15/2023 8:04 PM

## 2023-11-16 ENCOUNTER — Ambulatory Visit (INDEPENDENT_AMBULATORY_CARE_PROVIDER_SITE_OTHER)

## 2023-11-16 ENCOUNTER — Ambulatory Visit (INDEPENDENT_AMBULATORY_CARE_PROVIDER_SITE_OTHER): Admitting: Vascular Surgery

## 2023-11-16 ENCOUNTER — Encounter (INDEPENDENT_AMBULATORY_CARE_PROVIDER_SITE_OTHER): Payer: Self-pay | Admitting: Vascular Surgery

## 2023-11-16 VITALS — BP 114/75 | HR 73 | Resp 18 | Ht 67.0 in | Wt 147.6 lb

## 2023-11-16 DIAGNOSIS — I25118 Atherosclerotic heart disease of native coronary artery with other forms of angina pectoris: Secondary | ICD-10-CM | POA: Diagnosis not present

## 2023-11-16 DIAGNOSIS — E1169 Type 2 diabetes mellitus with other specified complication: Secondary | ICD-10-CM | POA: Diagnosis not present

## 2023-11-16 DIAGNOSIS — I6523 Occlusion and stenosis of bilateral carotid arteries: Secondary | ICD-10-CM | POA: Diagnosis not present

## 2023-11-16 DIAGNOSIS — I70213 Atherosclerosis of native arteries of extremities with intermittent claudication, bilateral legs: Secondary | ICD-10-CM

## 2023-11-16 DIAGNOSIS — E785 Hyperlipidemia, unspecified: Secondary | ICD-10-CM

## 2023-11-16 DIAGNOSIS — T827XXS Infection and inflammatory reaction due to other cardiac and vascular devices, implants and grafts, sequela: Secondary | ICD-10-CM

## 2023-11-16 DIAGNOSIS — E1151 Type 2 diabetes mellitus with diabetic peripheral angiopathy without gangrene: Secondary | ICD-10-CM

## 2023-11-17 ENCOUNTER — Encounter (INDEPENDENT_AMBULATORY_CARE_PROVIDER_SITE_OTHER): Payer: Self-pay | Admitting: Vascular Surgery

## 2023-11-17 ENCOUNTER — Other Ambulatory Visit: Payer: Self-pay | Admitting: Internal Medicine

## 2023-11-17 DIAGNOSIS — Z1231 Encounter for screening mammogram for malignant neoplasm of breast: Secondary | ICD-10-CM

## 2023-11-18 LAB — VAS US ABI WITH/WO TBI
Left ABI: 0.9
Right ABI: 0.88

## 2023-11-19 ENCOUNTER — Ambulatory Visit (INDEPENDENT_AMBULATORY_CARE_PROVIDER_SITE_OTHER): Admitting: Internal Medicine

## 2023-11-19 ENCOUNTER — Encounter: Payer: Self-pay | Admitting: Internal Medicine

## 2023-11-19 DIAGNOSIS — I739 Peripheral vascular disease, unspecified: Secondary | ICD-10-CM | POA: Diagnosis not present

## 2023-11-19 DIAGNOSIS — E1151 Type 2 diabetes mellitus with diabetic peripheral angiopathy without gangrene: Secondary | ICD-10-CM

## 2023-11-19 DIAGNOSIS — E1165 Type 2 diabetes mellitus with hyperglycemia: Secondary | ICD-10-CM | POA: Diagnosis not present

## 2023-11-19 DIAGNOSIS — E119 Type 2 diabetes mellitus without complications: Secondary | ICD-10-CM

## 2023-11-19 DIAGNOSIS — Z794 Long term (current) use of insulin: Secondary | ICD-10-CM | POA: Diagnosis not present

## 2023-11-19 MED ORDER — GABAPENTIN 300 MG PO CAPS: 300.0000 mg | ORAL_CAPSULE | Freq: Two times a day (BID) | ORAL | 0 refills | Status: DC

## 2023-11-19 MED ORDER — EMPAGLIFLOZIN 25 MG PO TABS
ORAL_TABLET | ORAL | 0 refills | Status: DC
Start: 2023-11-19 — End: 2024-04-14

## 2023-11-19 NOTE — Patient Instructions (Addendum)
 Your blood sugar control is improving  The low blood sugars are due to too high a dose of novolog , .  Don't use more than 6 units at meals   If you are counting carbs,  the sugars ARE ALREADY in the total carb count . You don't add them in again

## 2023-11-19 NOTE — Progress Notes (Signed)
 Subjective:  Patient ID: Laura Reed, female    DOB: [birth year]  Age: 63 y.o. MRN: [id number]  CC: The primary encounter diagnosis was Type II diabetes mellitus with peripheral circulatory disorder (HCC). Diagnoses of Type 2 diabetes mellitus with hyperglycemia, with long-term current use of insulin  (HCC), Insulin -requiring or dependent type II diabetes mellitus (HCC), PVD (peripheral vascular disease) (HCC), and Controlled type 2 DM with peripheral circulatory disorder [facility]) were also pertinent to this visit.   HPI Laura Reed presents for  Chief Complaint  Patient presents with   Medical Management of Chronic Issues    2 week follow up on diabetes    1) Laura Reed is being seen for  follow up  uncontrolled diabetes:  seen on may 1 at whih time her CBG monitor revealed poor control (in range 42% of the tins  bove tange 58%)  at that time . I  advised  her to increase  Tresiba  to 25 units and  continue the current  humalog  sliding scale to 8 units for pre dinner CBG 150 or higher ,  10 units for  CBG 200 or higher, and 14 units for CBG 240 or higher  and return in 2 weeks .    She returns today for follow up.  I have downloaded and reviewed the data from patient's continuous blood glucose monitor. For the period of May 2 to May 15 .  Patient's  sugars have been  IN RANGE 71    % OF THE TIME,   BELOW 0    % of the time.  And ABOVE RANGE  28  % OF THE TIME .Laura Reed  Thigh high readings are occurring after dinner for the most part.      Outpatient Medications Prior to Visit  Medication Sig Dispense Refill   acetaminophen  (TYLENOL ) 500 MG tablet Take 1,000 mg by mouth every 6 (six) hours as needed for mild pain or headache.      acidophilus (RISAQUAD) CAPS capsule Take 2 capsules by mouth 2 (two) times daily.     apixaban  (ELIQUIS ) 2.5 MG TABS tablet Take 1 tablet (2.5 mg total) by mouth 2 (two) times daily. 60 tablet 11   aspirin  81 MG chewable tablet Chew 81 mg by mouth at bedtime.       atorvastatin  (LIPITOR) 20 MG tablet TAKE 1 TABLET(20 MG) BY MOUTH DAILY 90 tablet 1   Cholecalciferol  (VITAMIN D3) 2000 UNITS TABS Take 2,000 Units by mouth daily after supper.      co-enzyme Q-10 30 MG capsule Take 30 mg by mouth daily.     Continuous Glucose Sensor (FREESTYLE LIBRE 2 SENSOR) MISC Use to check sugar at least 4 times daily 2 each 2   glucose blood test strip Use you check blood sugars up to four times daily. 100 each 12   insulin  degludec (TRESIBA  FLEXTOUCH) 100 UNIT/ML FlexTouch Pen Inject 15 Units into the skin daily. Reduced from 22 units daily.     insulin  lispro (HUMALOG  KWIKPEN) 100 UNIT/ML KwikPen ADMINISTER 5-10 UNITS UNDER THE SKIN THREE TIMES DAILY if you eat a meal.  5 units if blood sugar <200, 10 units if blood sugar >200. 15 mL 1   Insulin  Pen Needle (PEN NEEDLES) 32G X 4 MM MISC Use to take insulin  daily 100 each 3   losartan  (COZAAR ) 50 MG tablet Take 50 mg by mouth daily.     mometasone  (ELOCON ) 0.1 % cream APPLY TWICE DAILY TO EAR CANAL AS NEEDED  FOR ITCHING 15 g 1   Multiple Vitamin (MULTIVITAMIN) tablet Take 1 tablet by mouth daily.     oxyCODONE -acetaminophen  (PERCOCET/ROXICET) 5-325 MG tablet Take 1 tablet by mouth daily as needed (with wound vac exchanges). 15 tablet 0   polycarbophil (FIBERCON) 625 MG tablet Take 1 tablet (625 mg total) by mouth 2 (two) times daily. For stool bulking. 60 tablet 0   triamcinolone  ointment (KENALOG ) 0.5 % Apply 1 Application topically 2 (two) times daily. 80 g 0   empagliflozin  (JARDIANCE ) 25 MG TABS tablet TAKE 1 TABLET(25 MG) BY MOUTH DAILY BEFORE AND BREAKFAST 90 tablet 0   gabapentin  (NEURONTIN ) 300 MG capsule Take 1 capsule (300 mg total) by mouth 2 (two) times daily. 180 capsule 0   No facility-administered medications prior to visit.    Review of Systems;  Patient denies headache, fevers, malaise, unintentional weight loss, skin rash, eye pain, sinus congestion and sinus pain, sore throat, dysphagia,  hemoptysis ,  cough, dyspnea, wheezing, chest pain, palpitations, orthopnea, edema, abdominal pain, nausea, melena, diarrhea, constipation, flank pain, dysuria, hematuria, urinary  Frequency, nocturia, numbness, tingling, seizures,  Focal weakness, Loss of consciousness,  Tremor, insomnia, depression, anxiety, and suicidal ideation.      Objective:  There were no vitals taken for this visit.  BP Readings from Last 3 Encounters:  11/16/23 114/75  11/04/23 122/60  10/05/23 (!) 145/71    Wt Readings from Last 3 Encounters:  11/16/23 147 lb 9.6 oz (67 kg)  11/04/23 147 lb 12.8 oz (67 kg)  10/05/23 148 lb 3.2 oz (67.2 kg)    Physical Exam Vitals reviewed.  Constitutional:      General: She is not in acute distress.    Appearance: Normal appearance. She is normal weight. She is not ill-appearing, toxic-appearing or diaphoretic.  HENT:     Head: Normocephalic.  Eyes:     General: No scleral icterus.       Right eye: No discharge.        Left eye: No discharge.     Conjunctiva/sclera: Conjunctivae normal.  Cardiovascular:     Rate and Rhythm: Normal rate and regular rhythm.     Heart sounds: Normal heart sounds.  Pulmonary:     Effort: Pulmonary effort is normal. No respiratory distress.     Breath sounds: Normal breath sounds.  Musculoskeletal:        General: Normal range of motion.  Skin:    General: Skin is warm and dry.  Neurological:     General: No focal deficit present.     Mental Status: She is alert and oriented to person, place, and time. Mental status is at baseline.  Psychiatric:        Mood and Affect: Mood normal.        Behavior: Behavior normal.        Thought Content: Thought content normal.        Judgment: Judgment normal.   Lab Results  Component Value Date   HGBA1C 8.6 (H) 11/04/2023   HGBA1C 6.9 (A) 08/06/2023   HGBA1C 5.8 05/19/2023    Lab Results  Component Value Date   CREATININE 0.92 11/04/2023   CREATININE 0.76 08/21/2023   CREATININE 0.79 07/24/2023     Lab Results  Component Value Date   WBC 9.6 08/21/2023   HGB 12.4 08/21/2023   HCT 37.8 08/21/2023   PLT 315.0 08/21/2023   GLUCOSE 133 (H) 11/04/2023   CHOL 146 11/04/2023   TRIG 63.0 11/04/2023  HDL 77.90 11/04/2023   LDLDIRECT 61.0 11/04/2023   LDLCALC 55 11/04/2023   ALT 27 11/04/2023   AST 25 11/04/2023   NA 137 11/04/2023   K 4.6 11/04/2023   CL 101 11/04/2023   CREATININE 0.92 11/04/2023   BUN 31 (H) 11/04/2023   CO2 29 11/04/2023   TSH 0.864 06/22/2023   INR 1.1 06/21/2023   HGBA1C 8.6 (H) 11/04/2023   MICROALBUR 12.5 (H) 11/04/2023    ECHOCARDIOGRAM COMPLETE Result Date: 06/25/2023    ECHOCARDIOGRAM REPORT   Patient Name:   RONDALYN BELFORD Date of Exam: 06/24/2023 Medical Rec #:  [id number]       Height:       67.0 in Accession #:    [id number]      Weight:       141.4 lb Date of Birth:  11-Jul-1960       BSA:          1.745 m Patient Age:    62 years        BP:           107/74 mmHg Patient Gender: F               HR:           65 bpm. Exam Location:  [facility] Procedure: 2D Echo, Cardiac Doppler and Color Doppler Indications:     I48.91 Atrial Fibrillation.  History:         Patient has no prior history of Echocardiogram examinations.                  CHF, CAD, Signs/Symptoms:Murmur; Risk Factors:Hypertension and                  Diabetes.  Sonographer:     Brigid Canada RDCS Referring Phys:  [id number] Ninette Basque [name] Diagnosing Phys: Belva Boyden MD IMPRESSIONS  1. Left ventricular ejection fraction, by estimation, is 35 to 40%. The left ventricle has moderately decreased function. The left ventricle demonstrates global hypokinesis. The left ventricular internal cavity size was mildly dilated. Left ventricular diastolic parameters are indeterminate.  2. Right ventricular systolic function is normal. The right ventricular size is normal. There is mildly elevated pulmonary artery systolic pressure. The estimated right ventricular systolic pressure is 42.5 mmHg.  3. The  mitral valve is normal in structure. Mild to moderate mitral valve regurgitation. No evidence of mitral stenosis.  4. Tricuspid valve regurgitation is mild to moderate.  5. The aortic valve is calcified. There is moderate calcification of the aortic valve. Aortic valve regurgitation is not visualized. Aortic valve sclerosis/calcification is present, without any evidence of aortic stenosis.  6. The inferior vena cava is dilated in size with >50% respiratory variability, suggesting right atrial pressure of 8 mmHg. FINDINGS  Left Ventricle: Left ventricular ejection fraction, by estimation, is 35 to 40%. The left ventricle has moderately decreased function. The left ventricle demonstrates global hypokinesis. The left ventricular internal cavity size was mildly dilated. There is no left ventricular hypertrophy. Left ventricular diastolic parameters are indeterminate. Right Ventricle: The right ventricular size is normal. No increase in right ventricular wall thickness. Right ventricular systolic function is normal. There is mildly elevated pulmonary artery systolic pressure. The tricuspid regurgitant velocity is 2.85  m/s, and with an assumed right atrial pressure of 10 mmHg, the estimated right ventricular systolic pressure is 42.5 mmHg. Left Atrium: Left atrial size was normal in size. Right Atrium: Right atrial size was normal in size. Pericardium:  There is no evidence of pericardial effusion. Mitral Valve: The mitral valve is normal in structure. Mild to moderate mitral valve regurgitation. No evidence of mitral valve stenosis. Tricuspid Valve: The tricuspid valve is normal in structure. Tricuspid valve regurgitation is mild to moderate. No evidence of tricuspid stenosis. Aortic Valve: The aortic valve is calcified. There is moderate calcification of the aortic valve. Aortic valve regurgitation is not visualized. Aortic valve sclerosis/calcification is present, without any evidence of aortic stenosis. Pulmonic  Valve: The pulmonic valve was normal in structure. Pulmonic valve regurgitation is not visualized. No evidence of pulmonic stenosis. Aorta: The aortic root is normal in size and structure. Venous: The inferior vena cava is dilated in size with greater than 50% respiratory variability, suggesting right atrial pressure of 8 mmHg. IAS/Shunts: No atrial level shunt detected by color flow Doppler.  LEFT VENTRICLE PLAX 2D LVIDd:         5.90 cm   Diastology LVIDs:         4.60 cm   LV e' medial:    5.82 cm/s LV PW:         0.80 cm   LV E/e' medial:  25.1 LV IVS:        0.50 cm   LV e' lateral:   7.51 cm/s LVOT diam:     1.80 cm   LV E/e' lateral: 19.4 LV SV:         50 LV SV Index:   29 LVOT Area:     2.54 cm  RIGHT VENTRICLE            IVC RV Basal diam:  4.20 cm    IVC diam: 2.20 cm RV S prime:     8.98 cm/s TAPSE (M-mode): 2.4 cm LEFT ATRIUM             Index        RIGHT ATRIUM           Index LA diam:        4.10 cm 2.35 cm/m   RA Area:     13.20 cm LA Vol (A2C):   68.0 ml 38.96 ml/m  RA Volume:   29.20 ml  16.73 ml/m LA Vol (A4C):   47.6 ml 27.28 ml/m LA Biplane Vol: 58.8 ml 33.69 ml/m  AORTIC VALVE LVOT Vmax:   83.15 cm/s LVOT Vmean:  55.750 cm/s LVOT VTI:    0.196 m  AORTA Ao Root diam: 2.50 cm MITRAL VALVE                TRICUSPID VALVE MV Area (PHT): 3.68 cm     TR Peak grad:   32.5 mmHg MV Decel Time: 206 msec     TR Vmax:        285.00 cm/s MV E velocity: 146.00 cm/s MV A velocity: 79.00 cm/s   SHUNTS MV E/A ratio:  1.85         Systemic VTI:  0.20 m                             Systemic Diam: 1.80 cm Belva Boyden MD Electronically signed by Belva Boyden MD Signature Date/Time: 06/25/2023/5:07:32 PM    Final     Assessment & Plan:  .Type II diabetes mellitus with peripheral circulatory disorder [facility]) Assessment & Plan: Conplicated by neuropathy. 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 Her  acquired deformities  of both feet require use of orthotics to prevent pressure ulcers on the remaining feet;  she was strongly encouaged  to contact [facility]    Type 2 diabetes mellitus with hyperglycemia, with long-term current use of insulin  (HCC) -     Empagliflozin ; TAKE 1 TABLET(25 MG) BY MOUTH DAILY BEFORE AND BREAKFAST  Dispense: 90 tablet; Refill: 0 -     Comprehensive metabolic panel with GFR; Future -     Hemoglobin A1c; Future -     Lipid panel; Future -     LDL cholesterol, direct; Future  Insulin -requiring or dependent type II diabetes mellitus (HCC) Assessment & Plan: Uncontrolled currently,  attributed to increased appetite and intake of junk food since she quit smoking. Her evening blood sugars  have ben quite labile and she has  a history of severe hypoglycemia in the past which  makes her understandably apprehensive about taking larger doses of f mealtime insulin .  I will advise her to increase  Tresiba  to 25 units and  continue the current  humalog  sliding scale to 8 units for pre dinner CBG 150 or higher  10 units ofr CBG 200 or higher, and 14 units for CBG 240 or higher  and return in 2 weeks .     PVD (peripheral vascular disease) (HCC) Assessment & Plan: She maintains regular follow up with vascular surgery to surveillance of carotids and iliac disease with prior surgical interventions .  She is taking aspirin  a, plavix , and atorvastatin .  Most recent measurements May 2025: ABI Rt=0.88 and Lt=0.90   Duplex ultrasound of the femoral-femoral bypass with bilateral femoropopliteal's all grafts are patent   Controlled type 2 DM with peripheral circulatory disorder (HCC) Assessment & Plan: BS REVIEWED .  She has resumed use  of humalog  at dinner and lunch .  Advised to continue 15 units of Tresiba  daily , 25 mg Jardiance  and and  limit sliding scale to 6 units based on review of hypoglycemic events which have been rare but serious.  Continue  100 mg losartan  and 20 mg atorvastatin  .   Lab  Results  Component Value Date   HGBA1C 8.6 (H) 11/04/2023   Lab Results  Component Value Date   CHOL 146 11/04/2023   HDL 77.90 11/04/2023   LDLCALC 55 11/04/2023   LDLDIRECT 61.0 11/04/2023   TRIG 63.0 11/04/2023   CHOLHDL 2 11/04/2023   Lab Results  Component Value Date   LABMICR See below: 08/12/2022   LABMICR See below: 11/06/2021   MICROALBUR 12.5 (H) 11/04/2023   MICROALBUR 5.6 (H) 09/08/2022    CONTINUE CURRENT REGIMEN BUT REDUCE SLIDING SCALE TO MAXIMUM OF 6 UNITS  TO AVOID HYPOGLYCEMA.   Other orders -     Gabapentin ; Take 1 capsule (300 mg total) by mouth 2 (two) times daily.  Dispense: 180 capsule; Refill: 0    Follow-up: Return in about 3 months (around 02/19/2024) for follow up diabetes.   Thersia Flax, MD

## 2023-11-19 NOTE — Assessment & Plan Note (Signed)
 Uncontrolled currently,  attributed to increased appetite and intake of junk food since she quit smoking. Her evening blood sugars  have ben quite labile and she has  a history of severe hypoglycemia in the past which  makes her understandably apprehensive about taking larger doses of f mealtime insulin .  I will advise her to increase  Tresiba  to 25 units and  continue the current  humalog  sliding scale to 8 units for pre dinner CBG 150 or higher  10 units ofr CBG 200 or higher, and 14 units for CBG 240 or higher  and return in 2 weeks .

## 2023-11-19 NOTE — Assessment & Plan Note (Signed)
 She maintains regular follow up with vascular surgery to surveillance of carotids and iliac disease with prior surgical interventions .  She is taking aspirin  a, plavix , and atorvastatin .  Most recent measurements May 2025: ABI Rt=0.88 and Lt=0.90   Duplex ultrasound of the femoral-femoral bypass with bilateral femoropopliteal's all grafts are patent

## 2023-11-19 NOTE — Assessment & Plan Note (Signed)
 Conplicated by neuropathy. 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 Her  acquired deformities of both feet require use of orthotics to prevent pressure ulcers on the remaining feet;  she was strongly encouaged  to contact [facility]

## 2023-11-21 NOTE — Assessment & Plan Note (Addendum)
 BS REVIEWED .  She has resumed use  of humalog  at dinner and lunch .  Advised to continue 15 units of Tresiba  daily , 25 mg Jardiance  and and  limit sliding scale to 6 units based on review of hypoglycemic events which have been rare but serious.  Continue  100 mg losartan  and 20 mg atorvastatin  .   Lab Results  Component Value Date   HGBA1C 8.6 (H) 11/04/2023   Lab Results  Component Value Date   CHOL 146 11/04/2023   HDL 77.90 11/04/2023   LDLCALC 55 11/04/2023   LDLDIRECT 61.0 11/04/2023   TRIG 63.0 11/04/2023   CHOLHDL 2 11/04/2023   Lab Results  Component Value Date   LABMICR See below: 08/12/2022   LABMICR See below: 11/06/2021   MICROALBUR 12.5 (H) 11/04/2023   MICROALBUR 5.6 (H) 09/08/2022    CONTINUE CURRENT REGIMEN BUT REDUCE SLIDING SCALE TO MAXIMUM OF 6 UNITS  TO AVOID HYPOGLYCEMA.

## 2023-11-24 ENCOUNTER — Encounter (INDEPENDENT_AMBULATORY_CARE_PROVIDER_SITE_OTHER): Payer: Self-pay

## 2023-11-27 ENCOUNTER — Ambulatory Visit
Admission: RE | Admit: 2023-11-27 | Discharge: 2023-11-27 | Disposition: A | Discharge status: Home / Self Care | Source: Ambulatory Visit | Attending: Internal Medicine | Admitting: Internal Medicine

## 2023-11-27 DIAGNOSIS — Z1231 Encounter for screening mammogram for malignant neoplasm of breast: Secondary | ICD-10-CM | POA: Insufficient documentation

## 2023-12-01 ENCOUNTER — Other Ambulatory Visit (INDEPENDENT_AMBULATORY_CARE_PROVIDER_SITE_OTHER): Payer: Self-pay

## 2023-12-01 MED ORDER — AMOXICILLIN-POT CLAVULANATE 875-125 MG PO TABS: 1.0000 | ORAL_TABLET | Freq: Two times a day (BID) | ORAL | 5 refills | Status: DC

## 2023-12-03 ENCOUNTER — Ambulatory Visit: Payer: Self-pay | Admitting: Internal Medicine

## 2023-12-03 ENCOUNTER — Other Ambulatory Visit: Payer: Self-pay | Admitting: Internal Medicine

## 2023-12-03 DIAGNOSIS — R928 Other abnormal and inconclusive findings on diagnostic imaging of breast: Secondary | ICD-10-CM

## 2023-12-10 ENCOUNTER — Telehealth: Payer: Self-pay | Admitting: Cardiology

## 2023-12-10 NOTE — Telephone Encounter (Signed)
 Patient wants a provider switch from Dr. Junnie Olives to Dr. Gollan.  Please confirm.

## 2023-12-11 ENCOUNTER — Ambulatory Visit
Admission: RE | Admit: 2023-12-11 | Discharge: 2023-12-11 | Disposition: A | Discharge status: Home / Self Care | Source: Ambulatory Visit | Attending: Internal Medicine | Admitting: Internal Medicine

## 2023-12-11 DIAGNOSIS — R928 Other abnormal and inconclusive findings on diagnostic imaging of breast: Secondary | ICD-10-CM

## 2023-12-11 DIAGNOSIS — R92322 Mammographic fibroglandular density, left breast: Secondary | ICD-10-CM | POA: Diagnosis not present

## 2023-12-11 DIAGNOSIS — N6322 Unspecified lump in the left breast, upper inner quadrant: Secondary | ICD-10-CM | POA: Diagnosis not present

## 2023-12-12 ENCOUNTER — Ambulatory Visit: Payer: Self-pay | Admitting: Internal Medicine

## 2023-12-12 DIAGNOSIS — N6320 Unspecified lump in the left breast, unspecified quadrant: Secondary | ICD-10-CM

## 2023-12-21 ENCOUNTER — Telehealth: Payer: Self-pay | Admitting: Internal Medicine

## 2023-12-21 ENCOUNTER — Encounter: Payer: Self-pay | Admitting: Cardiology

## 2023-12-21 ENCOUNTER — Other Ambulatory Visit: Payer: Self-pay

## 2023-12-21 DIAGNOSIS — E1165 Type 2 diabetes mellitus with hyperglycemia: Secondary | ICD-10-CM

## 2023-12-21 MED ORDER — TRESIBA FLEXTOUCH 100 UNIT/ML ~~LOC~~ SOPN: 25.0000 [IU] | PEN_INJECTOR | Freq: Every day | SUBCUTANEOUS | 2 refills | Status: DC

## 2023-12-21 NOTE — Telephone Encounter (Signed)
 Please see communication below. Patient calling to check the status of refill, stating she ran out of medication today.   Copied from CRM [id number]. Topic: Clinical - Medication Refill >> Dec 21, 2023 11:31 AM Jenice Mitts wrote: Medication: insulin  degludec (TRESIBA  FLEXTOUCH) 100 UNIT/ML FlexTouch Pen   Has the patient contacted their pharmacy? Yes (Agent: If no, request that the patient contact the pharmacy for the refill. If patient does not wish to contact the pharmacy document the reason why and proceed with request.) (Agent: If yes, when and what did the pharmacy advise?)  This is the patient's preferred pharmacy:  [id number] [place], [place] - [address] AT [facility] OF [address] [place] [address] Phone: [phone] Fax: [phone]  Is this the correct pharmacy for this prescription? Yes If no, delete pharmacy and type the correct one.   Has the prescription been filled recently? No  Is the patient out of the medication? Yes  Has the patient been seen for an appointment in the last year OR does the patient have an upcoming appointment? Yes  Can we respond through MyChart? Yes  Agent: Please be advised that Rx refills may take up to 3 business days. We ask that you follow-up with your pharmacy. >> Dec 21, 2023  2:54 PM Chuck Crater wrote: Patient called to check the status of refill request. Advised patient that we are aware that she is out and advised of the 3 day time frame. >> Dec 21, 2023 11:32 AM Jenice Mitts wrote: She ran out today

## 2023-12-21 NOTE — Telephone Encounter (Unsigned)
 Copied from CRM [id number]. Topic: Clinical - Medication Refill >> Dec 21, 2023 11:31 AM Jenice Mitts wrote: Medication: insulin  degludec (TRESIBA  FLEXTOUCH) 100 UNIT/ML FlexTouch Pen   Has the patient contacted their pharmacy? Yes (Agent: If no, request that the patient contact the pharmacy for the refill. If patient does not wish to contact the pharmacy document the reason why and proceed with request.) (Agent: If yes, when and what did the pharmacy advise?)  This is the patient's preferred pharmacy:  [facility] #[id number] [place], [place] - [address] AT [facility] OF [address] [place] [address] Phone: [phone] Fax: [phone]  Is this the correct pharmacy for this prescription? Yes If no, delete pharmacy and type the correct one.   Has the prescription been filled recently? No  Is the patient out of the medication? Yes  Has the patient been seen for an appointment in the last year OR does the patient have an upcoming appointment? Yes  Can we respond through MyChart? Yes  Agent: Please be advised that Rx refills may take up to 3 business days. We ask that you follow-up with your pharmacy.

## 2023-12-21 NOTE — Telephone Encounter (Signed)
 Copied from CRM [id number]. Topic: Clinical - Medication Refill >> Dec 21, 2023 11:31 AM Jenice Mitts wrote: Medication: insulin  degludec (TRESIBA  FLEXTOUCH) 100 UNIT/ML FlexTouch Pen   Has the patient contacted their pharmacy? Yes (Agent: If no, request that the patient contact the pharmacy for the refill. If patient does not wish to contact the pharmacy document the reason why and proceed with request.) (Agent: If yes, when and what did the pharmacy advise?)  This is the patient's preferred pharmacy:  [facility] #[id number] [place], [place] - [address] AT [facility] OF [address] [place] [address] Phone: [phone] Fax: [phone]  Is this the correct pharmacy for this prescription? Yes If no, delete pharmacy and type the correct one.   Has the prescription been filled recently? No  Is the patient out of the medication? Yes  Has the patient been seen for an appointment in the last year OR does the patient have an upcoming appointment? Yes  Can we respond through MyChart? Yes  Agent: Please be advised that Rx refills may take up to 3 business days. We ask that you follow-up with your pharmacy. >> Dec 21, 2023 11:32 AM Jenice Mitts wrote: She ran out today

## 2023-12-21 NOTE — Telephone Encounter (Signed)
Medication has been refilled and pt is aware.  

## 2023-12-21 NOTE — Telephone Encounter (Signed)
 Patient called to check on the status of approval. Follow up setup for 08/11 and pt was added to wait list.

## 2023-12-21 NOTE — Telephone Encounter (Signed)
 Error

## 2023-12-21 NOTE — Addendum Note (Signed)
 Addended by: Chadwick Colonel on: 12/21/2023 03:08 PM   Modules accepted: Orders

## 2023-12-31 ENCOUNTER — Telehealth: Payer: Self-pay

## 2023-12-31 DIAGNOSIS — E1151 Type 2 diabetes mellitus with diabetic peripheral angiopathy without gangrene: Secondary | ICD-10-CM

## 2023-12-31 DIAGNOSIS — E1142 Type 2 diabetes mellitus with diabetic polyneuropathy: Secondary | ICD-10-CM

## 2023-12-31 DIAGNOSIS — Z89412 Acquired absence of left great toe: Secondary | ICD-10-CM | POA: Diagnosis not present

## 2023-12-31 DIAGNOSIS — S90411D Abrasion, right great toe, subsequent encounter: Secondary | ICD-10-CM | POA: Diagnosis not present

## 2023-12-31 DIAGNOSIS — S98132A Complete traumatic amputation of one left lesser toe, initial encounter: Secondary | ICD-10-CM | POA: Diagnosis not present

## 2023-12-31 DIAGNOSIS — S98131A Complete traumatic amputation of one right lesser toe, initial encounter: Secondary | ICD-10-CM | POA: Diagnosis not present

## 2023-12-31 NOTE — Addendum Note (Signed)
 Addended by: MARYLYNN VERNEITA CROME on: 12/31/2023 05:20 PM   Modules accepted: Orders

## 2023-12-31 NOTE — Telephone Encounter (Signed)
 Copied from CRM [id number]. Topic: Clinical - Order For Equipment >> Dec 31, 2023  1:43 PM Viola F wrote: Reason for CRM: Patient would like to know if she can get a prescription for Diabetic Shoes so she can bring it to [facility] - please call her at [phone]

## 2024-01-01 NOTE — Telephone Encounter (Signed)
 Placed in quick sign folder for signature.

## 2024-01-01 NOTE — Telephone Encounter (Signed)
 Spoke with pt to let her know that Dr. Marylynn is out of the office until Monday. Pt stated that was fine. Pt would like for us  to fax the order to [facility] once signed.

## 2024-01-05 NOTE — Telephone Encounter (Signed)
 Faxed to [facility]. Left a detailed message letting pt know that DME order has been faxed.

## 2024-01-11 DIAGNOSIS — E113512 Type 2 diabetes mellitus with proliferative diabetic retinopathy with macular edema, left eye: Secondary | ICD-10-CM | POA: Diagnosis not present

## 2024-01-11 DIAGNOSIS — Z961 Presence of intraocular lens: Secondary | ICD-10-CM | POA: Diagnosis not present

## 2024-01-11 DIAGNOSIS — E113553 Type 2 diabetes mellitus with stable proliferative diabetic retinopathy, bilateral: Secondary | ICD-10-CM | POA: Diagnosis not present

## 2024-01-11 LAB — HM DIABETES EYE EXAM

## 2024-01-13 ENCOUNTER — Other Ambulatory Visit: Payer: Self-pay | Admitting: Internal Medicine

## 2024-01-13 DIAGNOSIS — H60549 Acute eczematoid otitis externa, unspecified ear: Secondary | ICD-10-CM

## 2024-01-13 DIAGNOSIS — Z794 Long term (current) use of insulin: Secondary | ICD-10-CM | POA: Diagnosis not present

## 2024-01-13 DIAGNOSIS — L851 Acquired keratosis [keratoderma] palmaris et plantaris: Secondary | ICD-10-CM | POA: Diagnosis not present

## 2024-01-13 DIAGNOSIS — B351 Tinea unguium: Secondary | ICD-10-CM | POA: Diagnosis not present

## 2024-01-13 DIAGNOSIS — E1140 Type 2 diabetes mellitus with diabetic neuropathy, unspecified: Secondary | ICD-10-CM | POA: Diagnosis not present

## 2024-01-19 DIAGNOSIS — E1151 Type 2 diabetes mellitus with diabetic peripheral angiopathy without gangrene: Secondary | ICD-10-CM | POA: Diagnosis not present

## 2024-01-29 DIAGNOSIS — E1142 Type 2 diabetes mellitus with diabetic polyneuropathy: Secondary | ICD-10-CM | POA: Diagnosis not present

## 2024-02-02 ENCOUNTER — Other Ambulatory Visit: Payer: Self-pay

## 2024-02-02 MED ORDER — ATORVASTATIN CALCIUM 20 MG PO TABS: ORAL_TABLET | ORAL | 1 refills | Status: DC

## 2024-02-08 ENCOUNTER — Other Ambulatory Visit (INDEPENDENT_AMBULATORY_CARE_PROVIDER_SITE_OTHER)

## 2024-02-08 DIAGNOSIS — Z794 Long term (current) use of insulin: Secondary | ICD-10-CM

## 2024-02-08 DIAGNOSIS — E1165 Type 2 diabetes mellitus with hyperglycemia: Secondary | ICD-10-CM

## 2024-02-08 LAB — LIPID PANEL
Cholesterol: 142 mg/dL (ref 0–200)
HDL: 67.8 mg/dL (ref >39.00)
LDL Cholesterol: 59 mg/dL (ref 0–99)
NonHDL: 73.82
Total CHOL/HDL Ratio: 2
Triglycerides: 72 mg/dL (ref 0.0–149.0)
VLDL: 14.4 mg/dL (ref 0.0–40.0)

## 2024-02-08 LAB — COMPREHENSIVE METABOLIC PANEL WITH GFR
ALT: 22 U/L (ref 0–35)
AST: 22 U/L (ref 0–37)
Albumin: 4 g/dL (ref 3.5–5.2)
Alkaline Phosphatase: 74 U/L (ref 39–117)
BUN: 33 mg/dL — ABNORMAL HIGH (ref 6–23)
CO2: 30 meq/L (ref 19–32)
Calcium: 9 mg/dL (ref 8.4–10.5)
Chloride: 101 meq/L (ref 96–112)
Creatinine, Ser: 0.76 mg/dL (ref 0.40–1.20)
GFR: 82.24 mL/min (ref >60.00)
Glucose, Bld: 86 mg/dL (ref 70–99)
Potassium: 4.3 meq/L (ref 3.5–5.1)
Sodium: 138 meq/L (ref 135–145)
Total Bilirubin: 0.4 mg/dL (ref 0.2–1.2)
Total Protein: 6.5 g/dL (ref 6.0–8.3)

## 2024-02-08 LAB — HEMOGLOBIN A1C: Hgb A1c MFr Bld: 8 % — ABNORMAL HIGH (ref 4.6–6.5)

## 2024-02-08 LAB — LDL CHOLESTEROL, DIRECT: Direct LDL: 57 mg/dL

## 2024-02-09 ENCOUNTER — Ambulatory Visit: Payer: Self-pay | Admitting: Internal Medicine

## 2024-02-12 NOTE — Progress Notes (Signed)
 Cardiology Office Note  Date:  02/15/2024   ID:  Laura Reed, DOB 10/26/1960, MRN [id number]  PCP:  Marylynn Verneita CROME, MD   Chief Complaint  Patient presents with   PHQ-9 8 Week Follow-up   Follow-up    History Coronary artery disease. Patient states she has heart palpitations when she wakes up in the morning.    HPI:  Laura Reed a 63 y.o. femalewith past medical history of: Diabetes type 2 PAD (s/p right iliac stent, left Fem-distal artery bypass plus subsequent thrombectomy angioplasty and stent placement 2024)  Hyperlipidemia hx of CAD/CABG x 3 in 2009  Ejection fraction 35 to 40% dec 2024 Who presents for follow-up of her coronary artery disease  Previously seen by Maryl Seen once by one of our partners December 2024  now transferring care to myself  Sepsis 06/2023, wound infection  In hospital 1 month, on ABX  Chronic leg swelling on left, trace  echo 12/2021 EF 55%. Lexiscan  Myoview 2023 moderate anteroapical ischemia.  Echo June 24, 2023 Ejection fraction 35 to 40% In the setting of sepsis and atrial fibrillation  Labs reviewed A1C 8.0 Total chol 142, LDL 59  EKG personally reviewed by myself on todays visit EKG Interpretation Date/Time:  Monday February 15 2024 10:04:55 EDT Ventricular Rate:  69 PR Interval:  196 QRS Duration:  84 QT Interval:  400 QTC Calculation: 428 R Axis:   -28  Text Interpretation: Normal sinus rhythm Anteroseptal infarct (cited on or before 15-Feb-2024) When compared with ECG of 15-Feb-2024 10:01, No significant change was found Confirmed by Perla Lye [phone]) on 02/15/2024 10:13:45 AM    PMH:   has a past medical history of Abscess of groin, left (06/26/2023), Absence of kidney, Anxiety, Arthritis, Atherosclerosis of native arteries of extremities with intermittent claudication, bilateral legs (HCC), Bladder cancer (HCC), Cataract, CHF (congestive heart failure) (HCC), Complication of anesthesia, Coronary artery  disease, Diabetes mellitus, Family history of adverse reaction to anesthesia, GERD (gastroesophageal reflux disease), Heart murmur, Hemorrhoid, History of methicillin resistant staphylococcus aureus (MRSA) (2007), Hypertension, Neuromuscular disorder (HCC), Neuropathy, PVD (peripheral vascular disease) (HCC), Thyroid  nodule, Unspecified osteoarthritis, unspecified site, Urothelial carcinoma of kidney (HCC) (10/31/2014), Vitamin D  deficiency, unspecified, and Wears dentures.  PSH:    Past Surgical History:  Procedure Laterality Date   AMPUTATION TOE     right (4th and 5th); left (great toe, 3rd)   AMPUTATION TOE Right 07/16/2018   Procedure: AMPUTATION TOE/MPJ right 2nd;  Surgeon: Neill Boas, DPM;  Location: [facility];  Service: Podiatry;  Laterality: Right;   APPLICATION OF WOUND VAC Left 06/26/2023   Procedure: WOUND VAC EXCHANGE LEFT GROIN;  Surgeon: Jama Cordella MATSU, MD;  Location: [facility];  Service: Vascular;  Laterality: Left;   APPLICATION OF WOUND VAC Left 06/29/2023   Procedure: WOUND VAC EXCHANGE LEFT GROIN;  Surgeon: Jama Cordella MATSU, MD;  Location: [facility];  Service: Vascular;  Laterality: Left;   APPLICATION OF WOUND VAC Left 07/02/2023   Procedure: WOUND VAC EXCHANGE LEFT GROIN;  Surgeon: Marea Selinda RAMAN, MD;  Location: [facility];  Service: Vascular;  Laterality: Left;   APPLICATION OF WOUND VAC Left 07/06/2023   Procedure: WOUND VAC EXCHANGE LEFT GROIN;  Surgeon: Jama Cordella MATSU, MD;  Location: [facility];  Service: Vascular;  Laterality: Left;   APPLICATION OF WOUND VAC Left 07/16/2023   Procedure: WOUND VAC EXCHANGE LEFT GROIN;  Surgeon: Jama Cordella MATSU, MD;  Location: [facility];  Service: Vascular;  Laterality: Left;  APPLICATION OF WOUND VAC Left 07/23/2023   Procedure: WOUND VAC EXCHANGE;  Surgeon: Jama Cordella MATSU, MD;  Location: [facility];  Service: Vascular;  Laterality: Left;   APPLICATION OF WOUND VAC Left 07/13/2023   Procedure: WOUND VAC EXCHANGE LEFT GROIN;   Surgeon: Jama Cordella MATSU, MD;  Location: [facility];  Service: Vascular;  Laterality: Left;   APPLICATION OF WOUND VAC Left 07/09/2023   Procedure: WOUND VAC EXCHANGE OF LEFT GROIN;  Surgeon: Marea Selinda RAMAN, MD;  Location: [facility];  Service: Vascular;  Laterality: Left;   APPLICATION OF WOUND VAC Left 07/20/2023   Procedure: WOUND VAC EXCHANGE;  Surgeon: Jama Cordella MATSU, MD;  Location: [facility];  Service: Vascular;  Laterality: Left;   ARTERIAL BYPASS SURGRY  2009, 2013 x 2   right leg , done in [place]   CARDIAC CATHETERIZATION     CAROTID ENDARTERECTOMY Right 01/2014   Dr Jama   CATARACT EXTRACTION W/PHACO Right 12/14/2014   Procedure: CATARACT EXTRACTION PHACO AND INTRAOCULAR LENS PLACEMENT (IOC);  Surgeon: Newell Ovens, MD;  Location: [facility];  Service: Ophthalmology;  Laterality: Right;  US    00:38.6              AP        7.1                   CDE  2.76   CATARACT EXTRACTION W/PHACO Left 12/06/2019   Procedure: CATARACT EXTRACTION PHACO AND INTRAOCULAR LENS PLACEMENT (IOC) LEFT DIABETIC;  Surgeon: Jaye Fallow, MD;  Location: [facility];  Service: Ophthalmology;  Laterality: Left;  9.08 1:06.4   CESAREAN SECTION     CHOLECYSTECTOMY  03/03/2012   Porcelain gallbladder, gallstones,  [place]   COLONOSCOPY W/ BIOPSIES  04/28/2012   Hyperplastic rectal polyps.   COLONOSCOPY WITH PROPOFOL  N/A 04/02/2022   Procedure: COLONOSCOPY WITH PROPOFOL ;  Surgeon: Dessa Reyes ORN, MD;  Location[facility];  Service: Endoscopy;  Laterality: N/A;   CORONARY ARTERY BYPASS GRAFT  2009   3 vessel   CYSTOSCOPY W/ RETROGRADES Right 09/01/2016   Procedure: CYSTOSCOPY WITH RETROGRADE PYELOGRAM;  Surgeon: Rosina Riis, MD;  Location: [facility];  Service: Urology;  Laterality: Right;   CYSTOSCOPY W/ RETROGRADES Bilateral 03/19/2020   Procedure: CYSTOSCOPY WITH RETROGRADE PYELOGRAM;  Surgeon: Riis Rosina, MD;  Location: [facility];  Service: Urology;  Laterality: Bilateral;    CYSTOSCOPY WITH BIOPSY N/A 03/19/2020   Procedure: CYSTOSCOPY WITH BIOPSY;  Surgeon: Riis Rosina, MD;  Location: [facility];  Service: Urology;  Laterality: N/A;   EYE SURGERY     FEMORAL-POPLITEAL BYPASS GRAFT Left 04/29/2023   Procedure: BYPASS GRAFT FEMORAL-POPLITEAL ARTERY;  Surgeon: Jama Cordella MATSU, MD;  Location: [facility];  Service: Vascular;  Laterality: Left;   HERNIA REPAIR  10/31/2014   ventral, retro-rectus atrium mesh   INCISION AND DRAINAGE ABSCESS Left 06/24/2023   Procedure: INCISION AND DRAINAGE WITH SATORIUS FLAP;  Surgeon: Jama Cordella MATSU, MD;  Location: [facility];  Service: Vascular;  Laterality: Left;   IRRIGATION AND DEBRIDEMENT FOOT Left 01/18/2019   Procedure: IRRIGATION AND DEBRIDEMENT FOOT;  Surgeon: Neill Boas, DPM;  Location: [facility];  Service: Podiatry;  Laterality: Left;   LOWER EXTREMITY ANGIOGRAPHY Left 12/10/2016   Procedure: Lower Extremity Angiography;  Surgeon: Jama Cordella MATSU, MD;  Location: [facility];  Service: Cardiovascular;  Laterality: Left;   LOWER EXTREMITY ANGIOGRAPHY Left 02/02/2018   Procedure: LOWER EXTREMITY ANGIOGRAPHY;  Surgeon: Jama Cordella MATSU, MD;  Location: [facility]  INV[facility] CV [facility];  Service: Cardiovascular;  Laterality: Left;   LOWER EXTREMITY ANGIOGRAPHY Left 05/05/2018   Procedure: LOWER EXTREMITY ANGIOGRAPHY;  Surgeon: Jama Cordella MATSU, MD;  Location: [facility];  Service: Cardiovascular;  Laterality: Left;   LOWER EXTREMITY ANGIOGRAPHY Left 12/04/2020   Procedure: LOWER EXTREMITY ANGIOGRAPHY with Intervention;  Surgeon: Jama Cordella MATSU, MD;  Location: [facility];  Service: Cardiovascular;  Laterality: Left;   LOWER EXTREMITY ANGIOGRAPHY Left 04/24/2023   Procedure: Lower Extremity Angiography;  Surgeon: Jama Cordella MATSU, MD;  Location: [facility];  Service: Cardiovascular;  Laterality: Left;   LOWER EXTREMITY ANGIOGRAPHY Left 05/01/2023   Procedure: Lower Extremity Angiography;   Surgeon: Jama Cordella MATSU, MD;  Location: [facility];  Service: Cardiovascular;  Laterality: Left;   NEPHRECTOMY Left 10/31/2014   PERIPHERAL VASCULAR CATHETERIZATION Left 05/01/2015   Procedure: Lower Extremity Angiography;  Surgeon: Cordella MATSU Jama, MD;  Location: [facility];  Service: Cardiovascular;  Laterality: Left;   PERIPHERAL VASCULAR CATHETERIZATION  05/01/2015   Procedure: Lower Extremity Intervention;  Surgeon: Cordella MATSU Jama, MD;  Location: [facility];  Service: Cardiovascular;;   PERIPHERAL VASCULAR CATHETERIZATION Left 02/20/2015   Procedure: Pelvic Angiography;  Surgeon: Cordella MATSU Jama, MD;  Location: [facility];  Service: Cardiovascular;  Laterality: Left;   TRANSMETATARSAL AMPUTATION Left 05/05/2023   Procedure: TRANSMETATARSAL AMPUTATION LEFT;  Surgeon: Ashley Soulier, DPM;  Location: [facility];  Service: Orthopedics/Podiatry;  Laterality: Left;   TRANSURETHRAL RESECTION OF BLADDER TUMOR WITH MITOMYCIN -C N/A 09/01/2016   Procedure: TRANSURETHRAL RESECTION OF BLADDER TUMOR WITH MITOMYCIN -C;  Surgeon: Rosina Riis, MD;  Location: [facility];  Service: Urology;  Laterality: N/A;   TUBAL LIGATION  1992    Current Outpatient Medications  Medication Sig Dispense Refill   acetaminophen  (TYLENOL ) 500 MG tablet Take 1,000 mg by mouth every 6 (six) hours as needed for mild pain or headache.      acidophilus (RISAQUAD) CAPS capsule Take 2 capsules by mouth 2 (two) times daily.     amoxicillin -clavulanate (AUGMENTIN ) 875-125 MG tablet Take 1 tablet by mouth 2 (two) times daily. 28 tablet 5   apixaban  (ELIQUIS ) 2.5 MG TABS tablet Take 1 tablet (2.5 mg total) by mouth 2 (two) times daily. 60 tablet 11   aspirin  81 MG chewable tablet Chew 81 mg by mouth at bedtime.      atorvastatin  (LIPITOR) 20 MG tablet TAKE 1 TABLET(20 MG) BY MOUTH DAILY 90 tablet 1   Cholecalciferol  (VITAMIN D3) 2000 UNITS TABS Take 2,000 Units by mouth daily after  supper.      co-enzyme Q-10 30 MG capsule Take 30 mg by mouth daily.     Continuous Glucose Sensor (FREESTYLE LIBRE 2 SENSOR) MISC Use to check sugar at least 4 times daily 2 each 2   empagliflozin  (JARDIANCE ) 25 MG TABS tablet TAKE 1 TABLET(25 MG) BY MOUTH DAILY BEFORE AND BREAKFAST 90 tablet 0   gabapentin  (NEURONTIN ) 300 MG capsule Take 1 capsule (300 mg total) by mouth 2 (two) times daily. 180 capsule 0   glucose blood test strip Use you check blood sugars up to four times daily. 100 each 12   insulin  degludec (TRESIBA  FLEXTOUCH) 100 UNIT/ML FlexTouch Pen Inject 25 Units into the skin daily. 15 mL 2   insulin  lispro (HUMALOG  KWIKPEN) 100 UNIT/ML KwikPen ADMINISTER 5-10 UNITS UNDER THE SKIN THREE TIMES DAILY if you eat a meal.  5 units if blood sugar <200, 10 units  if blood sugar >200. 15 mL 1   Insulin  Pen Needle (PEN NEEDLES) 32G X 4 MM MISC Use to take insulin  daily 100 each 3   mometasone  (ELOCON ) 0.1 % cream APPLY TO THE AFFECTED AREA TWICE DAILY FOR EAR CANAL AS NEEDED FOR ITCHING 15 g 1   Multiple Vitamin (MULTIVITAMIN) tablet Take 1 tablet by mouth daily.     polycarbophil (FIBERCON) 625 MG tablet Take 1 tablet (625 mg total) by mouth 2 (two) times daily. For stool bulking. 60 tablet 0   triamcinolone  ointment (KENALOG ) 0.5 % APPLY TOPICALLY TO THE AFFECTED AREA TWICE DAILY 75 g 2   insulin  degludec (TRESIBA  FLEXTOUCH) 100 UNIT/ML FlexTouch Pen Inject 15 Units into the skin daily. Reduced from 22 units daily. (Patient not taking: Reported on 02/15/2024)     losartan  (COZAAR ) 50 MG tablet Take 50 mg by mouth daily. (Patient not taking: Reported on 02/15/2024)     oxyCODONE -acetaminophen  (PERCOCET/ROXICET) 5-325 MG tablet Take 1 tablet by mouth daily as needed (with wound vac exchanges). (Patient not taking: Reported on 02/15/2024) 15 tablet 0   No current facility-administered medications for this visit.    Allergies:   Patient has no known allergies.   Social History:  The patient   reports that she quit smoking about 10 years ago. Her smoking use included cigarettes. She started smoking about 45 years ago. She has a 70 pack-year smoking history. She has never used smokeless tobacco. She reports that she does not currently use alcohol. She reports that she does not currently use drugs after having used the following drugs: Cocaine.   Family History:   family history includes Breast cancer in her maternal grandmother; Cancer in her father and mother; Diabetes in her son.    Review of Systems: Review of Systems  Constitutional: Negative.   HENT: Negative.    Respiratory: Negative.    Cardiovascular: Negative.   Gastrointestinal: Negative.   Musculoskeletal: Negative.   Neurological: Negative.   Psychiatric/Behavioral: Negative.    All other systems reviewed and are negative.    PHYSICAL EXAM: VS:  BP 132/78 (BP Location: Left Arm)   Pulse 69   Ht 5' 7 (1.702 m)   Wt 157 lb 12.8 oz (71.6 kg)   SpO2 96%   BMI 24.71 kg/m  , BMI Body mass index is 24.71 kg/m. GEN: Well nourished, well developed, in no acute distress HEENT: normal Neck: no JVD, carotid bruits, or masses Cardiac: RRR; no murmurs, rubs, or gallops,no edema  Respiratory:  clear to auscultation bilaterally, normal work of breathing GI: soft, nontender, nondistended, + BS MS: no deformity or atrophy Skin: warm and dry, no rash Neuro:  Strength and sensation are intact Psych: euthymic mood, full affect  Recent Labs: 06/21/2023: B Natriuretic Peptide 557.6 06/22/2023: TSH 0.864 07/24/2023: Magnesium  2.0 08/21/2023: Hemoglobin 12.4; Platelets 315.0 02/08/2024: ALT 22; BUN 33; Creatinine, Ser 0.76; Potassium 4.3; Sodium 138    Lipid Panel Lab Results  Component Value Date   CHOL 142 02/08/2024   HDL 67.80 02/08/2024   LDLCALC 59 02/08/2024   TRIG 72.0 02/08/2024      Wt Readings from Last 3 Encounters:  02/15/24 157 lb 12.8 oz (71.6 kg)  11/16/23 147 lb 9.6 oz (67 kg)  11/04/23 147 lb  12.8 oz (67 kg)       ASSESSMENT AND PLAN:  Problem List Items Addressed This Visit       Cardiology Problems   Controlled type 2 DM with peripheral circulatory  disorder (HCC)   Type II diabetes mellitus with peripheral circulatory disorder (HCC)   Atherosclerosis of native arteries of extremity with intermittent claudication (HCC)   Relevant Orders   EKG 12-Lead (Completed)   CAD (coronary artery disease) - Primary   Relevant Orders   EKG 12-Lead (Completed)   EKG 12-Lead (Completed)   Chronic diastolic CHF (congestive heart failure) (HCC)   Relevant Orders   EKG 12-Lead (Completed)   ECHOCARDIOGRAM COMPLETE   Atherosclerosis of artery of extremity with rest pain (HCC)   Relevant Orders   EKG 12-Lead (Completed)   Hypertension   Relevant Orders   EKG 12-Lead (Completed)   Cardiac arrhythmia   Relevant Orders   LONG TERM MONITOR (3-14 DAYS)   Carotid stenosis   Other Visit Diagnoses       S/P CABG x 3           Paroxysmal atrial fibrillation Noted on EKG December 2024 in the setting of sepsis She reports having palpitations when she wakes up from a nap or in the morning Zio monitor ordered to evaluate for atrial fibrillation - For documentation of continued atrial fibrillation she would need Eliquis  5 twice daily and we could start beta-blocker  Hyperlipidemia Cholesterol is at goal on the current lipid regimen. No changes to the medications were made.  Diabetes type 2 with complications A1c 8.0, stressed importance of low carbohydrate diet, working closely with primary care  PAD Followed by vascular Severe lower extremity arterial disease, prior stenting On low-dose Eliquis  2.5 twice daily with aspirin  81 daily  Cardiomyopathy Ejection fraction 35% on echo December 2024 In the setting of sepsis, atrial fibrillation Repeat echocardiogram ordered to reassess ejection fraction  Signed, Velinda Lunger, M.D., Ph.D. [facility] [place],  [place] [phone]

## 2024-02-14 NOTE — Progress Notes (Signed)
 MRN : [id number]  Laura Reed is a 63 y.o. (1960/10/18) female who presents with chief complaint of check circulation.  History of Present Illness:  The patient returns to the office for followup regarding atherosclerotic changes of the lower extremities and review of the noninvasive studies.    There have been no interval changes in lower extremity symptoms. No interval shortening of the patient's claudication distance or development of rest pain symptoms. No new ulcers or wounds have occurred since the last visit.   Left groin has been stable it remains healed no drainage no pain   There have been no significant changes to the patient's overall health care.   The patient denies amaurosis fugax or recent TIA symptoms. There are no documented recent neurological changes noted. There is no history of DVT, PE or superficial thrombophlebitis. The patient denies recent episodes of angina or shortness of breath.    ABI Rt=0.96 and Lt=0.88  (previous ABI Rt=0.88 and Lt=0.90) Duplex ultrasound of the femoral-femoral bypass with bilateral femoropopliteal's all grafts are patent with uniform flow.   No outpatient medications have been marked as taking for the 02/15/24 encounter (Appointment) with Jama, Cordella MATSU, MD.    Past Medical History:  Diagnosis Date   Abscess of groin, left 06/26/2023   Absence of kidney    left   Anxiety    Arthritis    Atherosclerosis of native arteries of extremities with intermittent claudication, bilateral legs (HCC)    Bladder cancer (HCC)    CHF (congestive heart failure) (HCC)    Complication of anesthesia    BP HAS  RUN LOW AFTER SURGERY-LUNGS FILLED UP WITH FLUID AFTER  LEG STENT SURGERY    Coronary artery disease    Diabetes mellitus    Family history of adverse reaction to anesthesia    Sister - PONV   GERD (gastroesophageal reflux disease)    OCC TUMS   Heart murmur     Hemorrhoid    History of methicillin resistant staphylococcus aureus (MRSA) 2007   Hypertension    Neuropathy    PVD (peripheral vascular disease) (HCC)    Thyroid  nodule    right   Unspecified osteoarthritis, unspecified site    Urothelial carcinoma of kidney (HCC) 10/31/2014   INVASIVE UROTHELIAL CARCINOMA, LOW GRADE. T1, Nx.   Vitamin D  deficiency, unspecified    Wears dentures    full upper and lower    Past Surgical History:  Procedure Laterality Date   AMPUTATION TOE     right (4th and 5th); left (great toe, 3rd)   AMPUTATION TOE Right 07/16/2018   Procedure: AMPUTATION TOE/MPJ right 2nd;  Surgeon: Neill Boas, DPM;  Location: [facility];  Service: Podiatry;  Laterality: Right;   APPLICATION OF WOUND VAC Left 06/26/2023   Procedure: WOUND VAC EXCHANGE LEFT GROIN;  Surgeon: Jama Cordella MATSU, MD;  Location: [facility];  Service: Vascular;  Laterality: Left;   APPLICATION OF WOUND VAC Left 06/29/2023   Procedure: WOUND VAC EXCHANGE LEFT GROIN;  Surgeon: Jama Cordella MATSU, MD;  Location: [facility];  Service: Vascular;  Laterality: Left;  APPLICATION OF WOUND VAC Left 07/02/2023   Procedure: WOUND VAC EXCHANGE LEFT GROIN;  Surgeon: Marea Selinda RAMAN, MD;  Location: [facility];  Service: Vascular;  Laterality: Left;   APPLICATION OF WOUND VAC Left 07/06/2023   Procedure: WOUND VAC EXCHANGE LEFT GROIN;  Surgeon: Jama Cordella MATSU, MD;  Location: [facility];  Service: Vascular;  Laterality: Left;   APPLICATION OF WOUND VAC Left 07/16/2023   Procedure: WOUND VAC EXCHANGE LEFT GROIN;  Surgeon: Jama Cordella MATSU, MD;  Location: [facility];  Service: Vascular;  Laterality: Left;   APPLICATION OF WOUND VAC Left 07/23/2023   Procedure: WOUND VAC EXCHANGE;  Surgeon: Jama Cordella MATSU, MD;  Location: [facility];  Service: Vascular;  Laterality: Left;   APPLICATION OF WOUND VAC Left 07/13/2023   Procedure: WOUND VAC EXCHANGE LEFT GROIN;  Surgeon: Jama Cordella MATSU, MD;  Location: [facility];  Service:  Vascular;  Laterality: Left;   APPLICATION OF WOUND VAC Left 07/09/2023   Procedure: WOUND VAC EXCHANGE OF LEFT GROIN;  Surgeon: Marea Selinda RAMAN, MD;  Location: [facility];  Service: Vascular;  Laterality: Left;   APPLICATION OF WOUND VAC Left 07/20/2023   Procedure: WOUND VAC EXCHANGE;  Surgeon: Jama Cordella MATSU, MD;  Location: [facility];  Service: Vascular;  Laterality: Left;   ARTERIAL BYPASS SURGRY  2009, 2013 x 2   right leg , done in [place]   CARDIAC CATHETERIZATION     CAROTID ENDARTERECTOMY Right 01/2014   Dr Jama   CATARACT EXTRACTION W/PHACO Right 12/14/2014   Procedure: CATARACT EXTRACTION PHACO AND INTRAOCULAR LENS PLACEMENT (IOC);  Surgeon: Newell Ovens, MD;  Location: [facility];  Service: Ophthalmology;  Laterality: Right;  US    00:38.6              AP        7.1                   CDE  2.76   CATARACT EXTRACTION W/PHACO Left 12/06/2019   Procedure: CATARACT EXTRACTION PHACO AND INTRAOCULAR LENS PLACEMENT (IOC) LEFT DIABETIC;  Surgeon: Jaye Fallow, MD;  Location: [facility];  Service: Ophthalmology;  Laterality: Left;  9.08 1:06.4   CESAREAN SECTION     CHOLECYSTECTOMY  03-03-12   Porcelain gallbladder, gallstones,  [place]   COLONOSCOPY W/ BIOPSIES  04/28/2012   Hyperplastic rectal polyps.   COLONOSCOPY WITH PROPOFOL  N/A 04/02/2022   Procedure: COLONOSCOPY WITH PROPOFOL ;  Surgeon: Dessa Reyes ORN, MD;  Location: [facility];  Service: Endoscopy;  Laterality: N/A;   CORONARY ARTERY BYPASS GRAFT  2009   3 vessel   CYSTOSCOPY W/ RETROGRADES Right 09/01/2016   Procedure: CYSTOSCOPY WITH RETROGRADE PYELOGRAM;  Surgeon: Rosina Riis, MD;  Location: [facility];  Service: Urology;  Laterality: Right;   CYSTOSCOPY W/ RETROGRADES Bilateral 03/19/2020   Procedure: CYSTOSCOPY WITH RETROGRADE PYELOGRAM;  Surgeon: Riis Rosina, MD;  Location: [facility];  Service: Urology;  Laterality: Bilateral;   CYSTOSCOPY WITH BIOPSY N/A 03/19/2020   Procedure: CYSTOSCOPY WITH BIOPSY;   Surgeon: Riis Rosina, MD;  Location: [facility];  Service: Urology;  Laterality: N/A;   EYE SURGERY     FEMORAL-POPLITEAL BYPASS GRAFT Left 04/29/2023   Procedure: BYPASS GRAFT FEMORAL-POPLITEAL ARTERY;  Surgeon: Jama Cordella MATSU, MD;  Location: [facility];  Service: Vascular;  Laterality: Left;   HERNIA REPAIR  10-31-14   ventral, retro-rectus atrium mesh   INCISION AND DRAINAGE ABSCESS Left 06/24/2023   Procedure: INCISION AND DRAINAGE WITH SATORIUS FLAP;  Surgeon: Jama,  Cordella MATSU, MD;  Location: [facility];  Service: Vascular;  Laterality: Left;   IRRIGATION AND DEBRIDEMENT FOOT Left 01/18/2019   Procedure: IRRIGATION AND DEBRIDEMENT FOOT;  Surgeon: Neill Boas, DPM;  Location: [facility];  Service: Podiatry;  Laterality: Left;   LOWER EXTREMITY ANGIOGRAPHY Left 12/10/2016   Procedure: Lower Extremity Angiography;  Surgeon: Jama Cordella MATSU, MD;  Location: [facility];  Service: Cardiovascular;  Laterality: Left;   LOWER EXTREMITY ANGIOGRAPHY Left 02/02/2018   Procedure: LOWER EXTREMITY ANGIOGRAPHY;  Surgeon: Jama Cordella MATSU, MD;  Location: [facility];  Service: Cardiovascular;  Laterality: Left;   LOWER EXTREMITY ANGIOGRAPHY Left 05/05/2018   Procedure: LOWER EXTREMITY ANGIOGRAPHY;  Surgeon: Jama Cordella MATSU, MD;  Location: [facility];  Service: Cardiovascular;  Laterality: Left;   LOWER EXTREMITY ANGIOGRAPHY Left 12/04/2020   Procedure: LOWER EXTREMITY ANGIOGRAPHY with Intervention;  Surgeon: Jama Cordella MATSU, MD;  Location: [facility];  Service: Cardiovascular;  Laterality: Left;   LOWER EXTREMITY ANGIOGRAPHY Left 04/24/2023   Procedure: Lower Extremity Angiography;  Surgeon: Jama Cordella MATSU, MD;  Location: [facility];  Service: Cardiovascular;  Laterality: Left;   LOWER EXTREMITY ANGIOGRAPHY Left 05/01/2023   Procedure: Lower Extremity Angiography;  Surgeon: Jama Cordella MATSU, MD;  Location: [facility];  Service:  Cardiovascular;  Laterality: Left;   NEPHRECTOMY Left 10-31-14   PERIPHERAL VASCULAR CATHETERIZATION Left 05/01/2015   Procedure: Lower Extremity Angiography;  Surgeon: Cordella MATSU Jama, MD;  Location: [facility];  Service: Cardiovascular;  Laterality: Left;   PERIPHERAL VASCULAR CATHETERIZATION  05/01/2015   Procedure: Lower Extremity Intervention;  Surgeon: Cordella MATSU Jama, MD;  Location: [facility];  Service: Cardiovascular;;   PERIPHERAL VASCULAR CATHETERIZATION Left 02/20/2015   Procedure: Pelvic Angiography;  Surgeon: Cordella MATSU Jama, MD;  Location: [facility];  Service: Cardiovascular;  Laterality: Left;   TRANSMETATARSAL AMPUTATION Left 05/05/2023   Procedure: TRANSMETATARSAL AMPUTATION LEFT;  Surgeon: Ashley Soulier, DPM;  Location: [facility];  Service: Orthopedics/Podiatry;  Laterality: Left;   TRANSURETHRAL RESECTION OF BLADDER TUMOR WITH MITOMYCIN -C N/A 09/01/2016   Procedure: TRANSURETHRAL RESECTION OF BLADDER TUMOR WITH MITOMYCIN -C;  Surgeon: Rosina Riis, MD;  Location: [facility];  Service: Urology;  Laterality: N/A;    Social History Social History   Tobacco Use   Smoking status: Former    Current packs/day: 0.00    Average packs/day: 2.0 packs/day for 35.0 years (70.0 ttl pk-yrs)    Types: Cigarettes    Start date: 03/29/1978    Quit date: 03/29/2013    Years since quitting: 10.8   Smokeless tobacco: Never  Vaping Use   Vaping status: Former  Substance Use Topics   Alcohol use: Not Currently    Alcohol/week: 0.0 standard drinks of alcohol    Comment: LAST DRINK 2009   Drug use: Not Currently    Types: Cocaine    Comment: last used in 2007    Family History Family History  Problem Relation Age of Onset   Cancer Mother 60       Lung Cancer   Cancer Father 68       Lung Ca   Diabetes Son    Breast cancer Maternal Grandmother    Kidney cancer Neg Hx    Bladder Cancer Neg Hx    Prostate cancer Neg Hx     No Known  Allergies   REVIEW OF SYSTEMS (Negative unless checked)  Constitutional: [] Weight loss  [] Fever  []   Chills Cardiac: [] Chest pain   [] Chest pressure   [] Palpitations   [] Shortness of breath when laying flat   [] Shortness of breath with exertion. Vascular:  [x] Pain in legs with walking   [] Pain in legs at rest  [] History of DVT   [] Phlebitis   [] Swelling in legs   [] Varicose veins   [] Non-healing ulcers Pulmonary:   [] Uses home oxygen   [] Productive cough   [] Hemoptysis   [] Wheeze  [] COPD   [] Asthma Neurologic:  [] Dizziness   [] Seizures   [] History of stroke   [] History of TIA  [] Aphasia   [] Vissual changes   [] Weakness or numbness in arm   [] Weakness or numbness in leg Musculoskeletal:   [] Joint swelling   [] Joint pain   [] Low back pain Hematologic:  [] Easy bruising  [] Easy bleeding   [] Hypercoagulable state   [] Anemic Gastrointestinal:  [] Diarrhea   [] Vomiting  [] Gastroesophageal reflux/heartburn   [] Difficulty swallowing. Genitourinary:  [] Chronic kidney disease   [] Difficult urination  [] Frequent urination   [] Blood in urine Skin:  [] Rashes   [] Ulcers  Psychological:  [] History of anxiety   []  History of major depression.  Physical Examination  There were no vitals filed for this visit. There is no height or weight on file to calculate BMI. Gen: WD/WN, NAD Head: [place]/AT, No temporalis wasting.  Ear/Nose/Throat: Hearing grossly intact, nares w/o erythema or drainage Eyes: PER, EOMI, sclera nonicteric.  Neck: Supple, no masses.  No bruit or JVD.  Pulmonary:  Good air movement, no audible wheezing, no use of accessory muscles.  Cardiac: RRR, normal S1, S2, no Murmurs. Vascular:  mild trophic changes, no open wounds Vessel Right Left  Radial Palpable Palpable  PT Not Palpable Not Palpable  DP Not Palpable Not Palpable  Gastrointestinal: soft, non-distended. No guarding/no peritoneal signs.  Musculoskeletal: M/S 5/5 throughout.  No visible deformity.  Neurologic: CN 2-12 intact. Pain and  light touch intact in extremities.  Symmetrical.  Speech is fluent. Motor exam as listed above. Psychiatric: Judgment intact, Mood & affect appropriate for pt's clinical situation. Dermatologic: No rashes or ulcers noted.  No changes consistent with cellulitis.   CBC Lab Results  Component Value Date   WBC 9.6 08/21/2023   HGB 12.4 08/21/2023   HCT 37.8 08/21/2023   MCV 84.3 08/21/2023   PLT 315.0 08/21/2023    BMET    Component Value Date/Time   NA 138 02/08/2024 0942   NA 136 08/02/2021 1629   NA 135 11/02/2014 0609   K 4.3 02/08/2024 0942   K 4.2 11/02/2014 0609   CL 101 02/08/2024 0942   CL 107 11/02/2014 0609   CO2 30 02/08/2024 0942   CO2 23 11/02/2014 0609   GLUCOSE 86 02/08/2024 0942   GLUCOSE 108 (H) 11/02/2014 0609   BUN 33 (H) 02/08/2024 0942   BUN 25 08/02/2021 1629   BUN 20 11/02/2014 0609   CREATININE 0.76 02/08/2024 0942   CREATININE 1.01 11/09/2015 1549   CREATININE 1.01 11/09/2015 1549   CALCIUM  9.0 02/08/2024 0942   CALCIUM  7.3 (L) 11/02/2014 0609   GFRNONAA >60 07/24/2023 0505   GFRNONAA 50 (L) 11/02/2014 0609   GFRAA >60 01/19/2019 0357   GFRAA 58 (L) 11/02/2014 0609   CrCl cannot be calculated (Unknown ideal weight.).  COAG Lab Results  Component Value Date   INR 1.1 06/21/2023   INR 0.9 10/17/2014   INR 1.1 01/19/2014    Radiology No results found.   Assessment/Plan 1. Atherosclerosis of native artery of both lower  extremities with intermittent claudication (HCC) (Primary) Recommend:  The patient is status post successful revision of her bypass grafts.  The patient reports that the claudication symptoms and leg pain has improved.   The patient denies lifestyle limiting changes at this point in time.  No further invasive studies, angiography or surgery at this time. The patient should continue walking and begin a more formal exercise program.  The patient should continue antiplatelet therapy and aggressive treatment of the lipid  abnormalities  Continued surveillance is indicated as atherosclerosis is likely to progress with time.    Patient should undergo noninvasive studies as ordered. The patient will follow up with me to review the studies.  - VAS US  LOWER EXTREMITY ARTERIAL DUPLEX; Future - VAS US  ABI WITH/WO TBI; Future  2. Type II diabetes mellitus with peripheral circulatory disorder (HCC) Continue hypoglycemic medications as already ordered, these medications have been reviewed and there are no changes at this time.  Hgb A1C to be monitored as already arranged by primary service  3. Bilateral carotid artery stenosis Recommend:   Given the patient's asymptomatic subcritical stenosis no further invasive testing or surgery at this time.   Duplex ultrasound 12/25/2022 shows widely patent right carotid status post CEA and LICA 60-79% stenosis.   Continue antiplatelet therapy as prescribed Continue management of CAD, HTN and Hyperlipidemia Healthy heart diet,  encouraged exercise at least 4 times per week   Follow up in 12 months with duplex ultrasound and physical exam   4. Coronary artery disease of native artery of native heart with stable angina pectoris (HCC) Continue cardiac and antihypertensive medications as already ordered and reviewed, no changes at this time.  Continue statin as ordered and reviewed, no changes at this time  Nitrates PRN for chest pain  5. Primary hypertension Continue antihypertensive medications as already ordered, these medications have been reviewed and there are no changes at this time.    Cordella Shawl, MD  02/14/2024 4:28 PM

## 2024-02-15 ENCOUNTER — Ambulatory Visit (INDEPENDENT_AMBULATORY_CARE_PROVIDER_SITE_OTHER)

## 2024-02-15 ENCOUNTER — Ambulatory Visit: Attending: Cardiovascular Disease | Admitting: Cardiovascular Disease

## 2024-02-15 ENCOUNTER — Ambulatory Visit (INDEPENDENT_AMBULATORY_CARE_PROVIDER_SITE_OTHER): Admitting: Vascular Surgery

## 2024-02-15 ENCOUNTER — Encounter: Payer: Self-pay | Admitting: Cardiovascular Disease

## 2024-02-15 ENCOUNTER — Ambulatory Visit

## 2024-02-15 ENCOUNTER — Encounter (INDEPENDENT_AMBULATORY_CARE_PROVIDER_SITE_OTHER): Payer: Self-pay | Admitting: Vascular Surgery

## 2024-02-15 VITALS — BP 138/82 | HR 73 | Resp 16 | Wt 157.0 lb

## 2024-02-15 VITALS — BP 132/78 | HR 69 | Ht 67.0 in | Wt 157.8 lb

## 2024-02-15 DIAGNOSIS — E1151 Type 2 diabetes mellitus with diabetic peripheral angiopathy without gangrene: Secondary | ICD-10-CM

## 2024-02-15 DIAGNOSIS — Z951 Presence of aortocoronary bypass graft: Secondary | ICD-10-CM | POA: Diagnosis not present

## 2024-02-15 DIAGNOSIS — I70213 Atherosclerosis of native arteries of extremities with intermittent claudication, bilateral legs: Secondary | ICD-10-CM | POA: Diagnosis not present

## 2024-02-15 DIAGNOSIS — I10 Essential (primary) hypertension: Secondary | ICD-10-CM | POA: Diagnosis not present

## 2024-02-15 DIAGNOSIS — I6523 Occlusion and stenosis of bilateral carotid arteries: Secondary | ICD-10-CM

## 2024-02-15 DIAGNOSIS — I4891 Unspecified atrial fibrillation: Secondary | ICD-10-CM

## 2024-02-15 DIAGNOSIS — I5032 Chronic diastolic (congestive) heart failure: Secondary | ICD-10-CM | POA: Diagnosis not present

## 2024-02-15 DIAGNOSIS — I25118 Atherosclerotic heart disease of native coronary artery with other forms of angina pectoris: Secondary | ICD-10-CM

## 2024-02-15 DIAGNOSIS — I70229 Atherosclerosis of native arteries of extremities with rest pain, unspecified extremity: Secondary | ICD-10-CM | POA: Diagnosis not present

## 2024-02-15 DIAGNOSIS — I480 Paroxysmal atrial fibrillation: Secondary | ICD-10-CM

## 2024-02-15 LAB — VAS US ABI WITH/WO TBI
Left ABI: 0.88
Right ABI: 0.96

## 2024-02-15 NOTE — Patient Instructions (Addendum)
 Medication Instructions:  No changes  If you need a refill on your cardiac medications before your next appointment, please call your pharmacy.   Lab work: No new labs needed  Testing/Procedures:  Your physician has requested that you have an echocardiogram. Echocardiography is a painless test that uses sound waves to create images of your heart. It provides your doctor with information about the size and shape of your heart and how well your heart's chambers and valves are working.   You may receive an ultrasound enhancing agent through an IV if needed to better visualize your heart during the echo. This procedure takes approximately one hour.  There are no restrictions for this procedure.  This will take place at [address] [facility]) [address], [place] [address]  Please note: We ask at that you not bring children with you during ultrasound (echo/ vascular) testing. Due to room size and safety concerns, children are not allowed in the ultrasound rooms during exams. Our front office staff cannot provide observation of children in our lobby area while testing is being conducted. An adult accompanying a patient to their appointment will only be allowed in the ultrasound room at the discretion of the ultrasound technician under special circumstances. We apologize for any inconvenience.   Your physician has recommended that you wear a Zio monitor.   This monitor is a medical device that records the heart's electrical activity. Doctors most often use these monitors to diagnose arrhythmias. Arrhythmias are problems with the speed or rhythm of the heartbeat. The monitor is a small device applied to your chest. You can wear one while you do your normal daily activities. While wearing this monitor if you have any symptoms to push the button and record what you felt. Once you have worn this monitor for the period of time provider prescribed (Usually 14 days), you will return the monitor  device in the postage paid box. Once it is returned they will download the data collected and provide us  with a report which the provider will then review and we will call you with those results. Important tips:  Avoid showering during the first 24 hours of wearing the monitor. Avoid excessive sweating to help maximize wear time. Do not submerge the device, no hot tubs, and no swimming pools. Keep any lotions or oils away from the patch. After 24 hours you may shower with the patch on. Take brief showers with your back facing the shower head.  Do not remove patch once it has been placed because that will interrupt data and decrease adhesive wear time. Push the button when you have any symptoms and write down what you were feeling. Once you have completed wearing your monitor, remove and place into box which has postage paid and place in your outgoing mailbox.  If for some reason you have misplaced your box then call our office and we can provide another box and/or mail it off for you.   Follow-Up: At [facility], you and your health needs are our priority.  As part of our continuing mission to provide you with exceptional heart care, we have created designated Provider Care Teams.  These Care Teams include your primary Cardiologist (physician) and Advanced Practice Providers (APPs -  Physician Assistants and Nurse Practitioners) who all work together to provide you with the care you need, when you need it.  You will need a follow up appointment in 6 months  Providers on your designated Care Team:   Lonni  Vivienne, NP Bernardino Bring, PA-C Cadence Franchester, PA-C  COVID-19 Vaccine Information can be found at: [email or web address] For questions related to vaccine distribution or appointments, please email [email or web address] or call [phone].

## 2024-02-16 ENCOUNTER — Encounter (INDEPENDENT_AMBULATORY_CARE_PROVIDER_SITE_OTHER): Payer: Self-pay | Admitting: Vascular Surgery

## 2024-02-23 ENCOUNTER — Ambulatory Visit (INDEPENDENT_AMBULATORY_CARE_PROVIDER_SITE_OTHER): Admitting: Internal Medicine

## 2024-02-23 ENCOUNTER — Encounter: Payer: Self-pay | Admitting: Internal Medicine

## 2024-02-23 VITALS — BP 142/80 | HR 72 | Ht 67.0 in | Wt 161.6 lb

## 2024-02-23 DIAGNOSIS — Z85528 Personal history of other malignant neoplasm of kidney: Secondary | ICD-10-CM

## 2024-02-23 DIAGNOSIS — Z794 Long term (current) use of insulin: Secondary | ICD-10-CM

## 2024-02-23 DIAGNOSIS — E1169 Type 2 diabetes mellitus with other specified complication: Secondary | ICD-10-CM | POA: Diagnosis not present

## 2024-02-23 DIAGNOSIS — E1165 Type 2 diabetes mellitus with hyperglycemia: Secondary | ICD-10-CM

## 2024-02-23 DIAGNOSIS — E785 Hyperlipidemia, unspecified: Secondary | ICD-10-CM

## 2024-02-23 DIAGNOSIS — E1151 Type 2 diabetes mellitus with diabetic peripheral angiopathy without gangrene: Secondary | ICD-10-CM | POA: Diagnosis not present

## 2024-02-23 DIAGNOSIS — Z8551 Personal history of malignant neoplasm of bladder: Secondary | ICD-10-CM | POA: Insufficient documentation

## 2024-02-23 MED ORDER — GABAPENTIN 300 MG PO CAPS: 300.0000 mg | ORAL_CAPSULE | Freq: Two times a day (BID) | ORAL | 0 refills | Status: AC

## 2024-02-23 NOTE — Patient Instructions (Addendum)
 Continue 25 units of your nighttime insulin   Change the mealtime  insulin  to a minimum of 2 units and a maximum of 4 units  Remind me to review sugars in 2 weeks   Dr Dr Bjorn old office and set up a visit with one of the other urologist    Try adding generic allegra (fexofenadine)  for the itching ;  you may take up to 4 times daily if needed to control your itching

## 2024-02-23 NOTE — Assessment & Plan Note (Addendum)
 BS REVIEWED .   Advised to continue 25 units of Tresiba  daily , 25 mg Jardiance  and and  limit sliding scale to 4 units based on review of hypoglycemic events which have been rare but serious.  Continue  100 mg losartan  and 20 mg atorvastatin  .   Lab Results  Component Value Date   HGBA1C 8.0 (H) 02/08/2024   Lab Results  Component Value Date   CHOL 142 02/08/2024   HDL 67.80 02/08/2024   LDLCALC 59 02/08/2024   LDLDIRECT 57.0 02/08/2024   TRIG 72.0 02/08/2024   CHOLHDL 2 02/08/2024   Lab Results  Component Value Date   LABMICR See below: 08/12/2022   LABMICR See below: 11/06/2021   MICROALBUR 12.5 (H) 11/04/2023    CONTINUE CURRENT REGIMEN BUT REDUCE SLIDING SCALE TO MAXIMUM OF 6 UNITS  TO AVOID HYPOGLYCEMA.

## 2024-02-23 NOTE — Assessment & Plan Note (Signed)
 Invasive uroepithelial carcinoma of left kidney ,  Diagnosed and treated with nephroureterectomy April 2016.  No recurrence at follow up with Urology.  Annual follow up is due

## 2024-02-23 NOTE — Assessment & Plan Note (Signed)
 Last cystoscopy was February 2024.  She is overdue for 6 month follow up

## 2024-02-23 NOTE — Progress Notes (Signed)
 Subjective:  Patient ID: Laura Reed, female    DOB: 04/04/1961  Age: 63 y.o. MRN: [id number]  CC: The primary encounter diagnosis was Hyperlipidemia associated with type 2 diabetes mellitus (HCC). Diagnoses of Type 2 diabetes mellitus with hyperglycemia, with long-term current use of insulin  (HCC), Controlled type 2 DM with peripheral circulatory disorder (HCC), History of renal cell carcinoma, and History of bladder cancer were also pertinent to this visit.   HPI Laura Reed presents for  Chief Complaint  Patient presents with   Medical Management of Chronic Issues    3 month follow up diabetes   1) PAF: occurred during sepsis,  now seeing Gollan,  wearing ZIO montior   2) History of blader CA  she needs new urologist since  Dr.  Penne left   3) Type 2 DM:  I have downloaded and reviewed the data from patient's continuous blood glucose monitor  for  the period  of  August 6 to August 19.  Patient's  sugars have been  IN RANGE   65  % OF THE TIME,   ABOVE RANGE  33    % of the time.   .  Medications currently used for glycemic control are   sliding scale insulin  starting at  130 and based on carb estimate of meal  up to 10 units of novolog .  2 times daily and 25 units Tresiba .  Also uising jardiance .  Patient reports compliance with medication approximately  100  % of the tine.  Medication changes based on this review  are as follows:   4) has been tobacco free for 10 months .  Eating instead, weight gain of 14 lbs. Since May   5) chronic urticaria .  Seeing dermatology in the near future.  Using cream but tching is diffuse  Outpatient Medications Prior to Visit  Medication Sig Dispense Refill   acetaminophen  (TYLENOL ) 500 MG tablet Take 1,000 mg by mouth every 6 (six) hours as needed for mild pain or headache.      acidophilus (RISAQUAD) CAPS capsule Take 2 capsules by mouth 2 (two) times daily.     amoxicillin -clavulanate (AUGMENTIN ) 875-125 MG tablet Take 1 tablet by  mouth 2 (two) times daily. 28 tablet 5   apixaban  (ELIQUIS ) 2.5 MG TABS tablet Take 1 tablet (2.5 mg total) by mouth 2 (two) times daily. 60 tablet 11   aspirin  81 MG chewable tablet Chew 81 mg by mouth at bedtime.      atorvastatin  (LIPITOR) 20 MG tablet TAKE 1 TABLET(20 MG) BY MOUTH DAILY 90 tablet 1   Cholecalciferol  (VITAMIN D3) 2000 UNITS TABS Take 2,000 Units by mouth daily after supper.      co-enzyme Q-10 30 MG capsule Take 30 mg by mouth daily.     Continuous Glucose Sensor (FREESTYLE LIBRE 2 SENSOR) MISC Use to check sugar at least 4 times daily 2 each 2   empagliflozin  (JARDIANCE ) 25 MG TABS tablet TAKE 1 TABLET(25 MG) BY MOUTH DAILY BEFORE AND BREAKFAST 90 tablet 0   glucose blood test strip Use you check blood sugars up to four times daily. 100 each 12   insulin  degludec (TRESIBA  FLEXTOUCH) 100 UNIT/ML FlexTouch Pen Inject 15 Units into the skin daily. Reduced from 22 units daily.     insulin  degludec (TRESIBA  FLEXTOUCH) 100 UNIT/ML FlexTouch Pen Inject 25 Units into the skin daily. 15 mL 2   insulin  lispro (HUMALOG  KWIKPEN) 100 UNIT/ML KwikPen ADMINISTER 5-10 UNITS UNDER THE SKIN  THREE TIMES DAILY if you eat a meal.  5 units if blood sugar <200, 10 units if blood sugar >200. (Patient taking differently: 5 Units 3 (three) times daily. ADMINISTER 5-10 UNITS UNDER THE SKIN THREE TIMES DAILY if you eat a meal.  5 units if blood sugar <200, 10 units if blood sugar >200.) 15 mL 1   Insulin  Pen Needle (PEN NEEDLES) 32G X 4 MM MISC Use to take insulin  daily 100 each 3   mometasone  (ELOCON ) 0.1 % cream APPLY TO THE AFFECTED AREA TWICE DAILY FOR EAR CANAL AS NEEDED FOR ITCHING 15 g 1   Multiple Vitamin (MULTIVITAMIN) tablet Take 1 tablet by mouth daily.     polycarbophil (FIBERCON) 625 MG tablet Take 1 tablet (625 mg total) by mouth 2 (two) times daily. For stool bulking. 60 tablet 0   triamcinolone  ointment (KENALOG ) 0.5 % APPLY TOPICALLY TO THE AFFECTED AREA TWICE DAILY 75 g 2   gabapentin   (NEURONTIN ) 300 MG capsule Take 1 capsule (300 mg total) by mouth 2 (two) times daily. 180 capsule 0   losartan  (COZAAR ) 50 MG tablet Take 50 mg by mouth daily. (Patient not taking: Reported on 02/23/2024)     oxyCODONE -acetaminophen  (PERCOCET/ROXICET) 5-325 MG tablet Take 1 tablet by mouth daily as needed (with wound vac exchanges). (Patient not taking: Reported on 02/15/2024) 15 tablet 0   No facility-administered medications prior to visit.    Review of Systems;  Patient denies headache, fevers, malaise, unintentional weight loss, skin rash, eye pain, sinus congestion and sinus pain, sore throat, dysphagia,  hemoptysis , cough, dyspnea, wheezing, chest pain, palpitations, orthopnea, edema, abdominal pain, nausea, melena, diarrhea, constipation, flank pain, dysuria, hematuria, urinary  Frequency, nocturia, numbness, tingling, seizures,  Focal weakness, Loss of consciousness,  Tremor, insomnia, depression, anxiety, and suicidal ideation.      Objective:  BP (!) 142/80   Pulse 72   Ht 5' 7 (1.702 m)   Wt 161 lb 9.6 oz (73.3 kg)   SpO2 96%   BMI 25.31 kg/m   BP Readings from Last 3 Encounters:  02/23/24 (!) 142/80  02/15/24 138/82  02/15/24 132/78    Wt Readings from Last 3 Encounters:  02/23/24 161 lb 9.6 oz (73.3 kg)  02/15/24 157 lb (71.2 kg)  02/15/24 157 lb 12.8 oz (71.6 kg)    Physical Exam Vitals reviewed.  Constitutional:      General: She is not in acute distress.    Appearance: Normal appearance. She is normal weight. She is not ill-appearing, toxic-appearing or diaphoretic.  HENT:     Head: Normocephalic.  Eyes:     General: No scleral icterus.       Right eye: No discharge.        Left eye: No discharge.     Conjunctiva/sclera: Conjunctivae normal.  Cardiovascular:     Rate and Rhythm: Normal rate and regular rhythm.     Heart sounds: Normal heart sounds.  Pulmonary:     Effort: Pulmonary effort is normal. No respiratory distress.     Breath sounds: Normal  breath sounds.  Musculoskeletal:        General: Normal range of motion.  Skin:    General: Skin is warm and dry.  Neurological:     General: No focal deficit present.     Mental Status: She is alert and oriented to person, place, and time. Mental status is at baseline.  Psychiatric:        Mood and Affect: Mood normal.  Behavior: Behavior normal.        Thought Content: Thought content normal.        Judgment: Judgment normal.     Lab Results  Component Value Date   HGBA1C 8.0 (H) 02/08/2024   HGBA1C 8.6 (H) 11/04/2023   HGBA1C 6.9 (A) 08/06/2023    Lab Results  Component Value Date   CREATININE 0.76 02/08/2024   CREATININE 0.92 11/04/2023   CREATININE 0.76 08/21/2023    Lab Results  Component Value Date   WBC 9.6 08/21/2023   HGB 12.4 08/21/2023   HCT 37.8 08/21/2023   PLT 315.0 08/21/2023   GLUCOSE 86 02/08/2024   CHOL 142 02/08/2024   TRIG 72.0 02/08/2024   HDL 67.80 02/08/2024   LDLDIRECT 57.0 02/08/2024   LDLCALC 59 02/08/2024   ALT 22 02/08/2024   AST 22 02/08/2024   NA 138 02/08/2024   K 4.3 02/08/2024   CL 101 02/08/2024   CREATININE 0.76 02/08/2024   BUN 33 (H) 02/08/2024   CO2 30 02/08/2024   TSH 0.864 06/22/2023   INR 1.1 06/21/2023   HGBA1C 8.0 (H) 02/08/2024   MICROALBUR 12.5 (H) 11/04/2023    MM 3D DIAGNOSTIC MAMMOGRAM UNILATERAL LEFT BREAST Result Date: 12/11/2023 CLINICAL DATA:  Screening recall for possible LEFT breast asymmetry. Patient denies any known recent trauma but reports a recent extended hospitalization for a groin infection. EXAM: DIGITAL DIAGNOSTIC UNILATERAL LEFT MAMMOGRAM WITH TOMOSYNTHESIS AND CAD; ULTRASOUND LEFT BREAST LIMITED TECHNIQUE: Left digital diagnostic mammography and breast tomosynthesis was performed. The images were evaluated with computer-aided detection. ; Targeted ultrasound examination of the left breast was performed. COMPARISON:  Previous exam(s). ACR Breast Density Category b: There are scattered areas  of fibroglandular density. FINDINGS: Mammogram: Diagnostic views of the left breast demonstrate a persistent 3 mm asymmetry in the upper slightly inner left breast posterior depth (spot CC image 17/51). This corresponds with the screening mammogram finding. The favored correlate on the ML view is seen on image 27. This is not definitely stable compared to prior examinations, however could possibly be excluded from the field of view due to far posterior location. No additional findings are identified in the left breast. Ultrasound: On physical exam, no mass is palpated in the upper inner left breast. No overlying skin changes are noted. Targeted left breast ultrasound was performed by the sonographer and physician. At 11 o'clock 9 cm from nipple, there is a superficial oval circumscribed anechoic mass that measures 2 x 1 x 2 mm. There is no internal vascularity. There is surrounding echogenic fat. This is favored to correspond with the focal asymmetry seen on mammogram and may represent early/evolving benign fat necrosis. IMPRESSION: LEFT breast probably benign asymmetry in the upper inner quadrant, possibly representing benign fat necrosis. Recommend left breast diagnostic mammogram and ultrasound in 6 months. RECOMMENDATION: LEFT breast diagnostic mammogram and ultrasound in 6 months. I have discussed the findings and recommendations with the patient. If applicable, a reminder letter will be sent to the patient regarding the next appointment. BI-RADS CATEGORY  3: Probably benign. Electronically Signed   By: Dirk Arrant M.D.   On: 12/11/2023 12:03   US  LIMITED ULTRASOUND INCLUDING AXILLA LEFT BREAST  Result Date: 12/11/2023 CLINICAL DATA:  Screening recall for possible LEFT breast asymmetry. Patient denies any known recent trauma but reports a recent extended hospitalization for a groin infection. EXAM: DIGITAL DIAGNOSTIC UNILATERAL LEFT MAMMOGRAM WITH TOMOSYNTHESIS AND CAD; ULTRASOUND LEFT BREAST LIMITED  TECHNIQUE: Left digital diagnostic mammography and breast  tomosynthesis was performed. The images were evaluated with computer-aided detection. ; Targeted ultrasound examination of the left breast was performed. COMPARISON:  Previous exam(s). ACR Breast Density Category b: There are scattered areas of fibroglandular density. FINDINGS: Mammogram: Diagnostic views of the left breast demonstrate a persistent 3 mm asymmetry in the upper slightly inner left breast posterior depth (spot CC image 17/51). This corresponds with the screening mammogram finding. The favored correlate on the ML view is seen on image 27. This is not definitely stable compared to prior examinations, however could possibly be excluded from the field of view due to far posterior location. No additional findings are identified in the left breast. Ultrasound: On physical exam, no mass is palpated in the upper inner left breast. No overlying skin changes are noted. Targeted left breast ultrasound was performed by the sonographer and physician. At 11 o'clock 9 cm from nipple, there is a superficial oval circumscribed anechoic mass that measures 2 x 1 x 2 mm. There is no internal vascularity. There is surrounding echogenic fat. This is favored to correspond with the focal asymmetry seen on mammogram and may represent early/evolving benign fat necrosis. IMPRESSION: LEFT breast probably benign asymmetry in the upper inner quadrant, possibly representing benign fat necrosis. Recommend left breast diagnostic mammogram and ultrasound in 6 months. RECOMMENDATION: LEFT breast diagnostic mammogram and ultrasound in 6 months. I have discussed the findings and recommendations with the patient. If applicable, a reminder letter will be sent to the patient regarding the next appointment. BI-RADS CATEGORY  3: Probably benign. Electronically Signed   By: Dirk Arrant M.D.   On: 12/11/2023 12:03    Assessment & Plan:  .Hyperlipidemia associated with type 2  diabetes mellitus (HCC) -     Lipid panel; Future -     LDL cholesterol, direct; Future  Type 2 diabetes mellitus with hyperglycemia, with long-term current use of insulin  (HCC) -     Hemoglobin A1c; Future -     Comprehensive metabolic panel with GFR; Future  Controlled type 2 DM with peripheral circulatory disorder (HCC) Assessment & Plan: BS REVIEWED .   Advised to continue 25 units of Tresiba  daily , 25 mg Jardiance  and and  limit sliding scale to 4 units based on review of hypoglycemic events which have been rare but serious.  Continue  100 mg losartan  and 20 mg atorvastatin  .   Lab Results  Component Value Date   HGBA1C 8.0 (H) 02/08/2024   Lab Results  Component Value Date   CHOL 142 02/08/2024   HDL 67.80 02/08/2024   LDLCALC 59 02/08/2024   LDLDIRECT 57.0 02/08/2024   TRIG 72.0 02/08/2024   CHOLHDL 2 02/08/2024   Lab Results  Component Value Date   LABMICR See below: 08/12/2022   LABMICR See below: 11/06/2021   MICROALBUR 12.5 (H) 11/04/2023    CONTINUE CURRENT REGIMEN BUT REDUCE SLIDING SCALE TO MAXIMUM OF 6 UNITS  TO AVOID HYPOGLYCEMA.   History of renal cell carcinoma Assessment & Plan: Invasive uroepithelial carcinoma of left kidney ,  Diagnosed and treated with nephroureterectomy April 2016.  No recurrence at follow up with Urology.  Annual follow up is due    History of bladder cancer Assessment & Plan: Last cystoscopy was February 2024.  She is overdue for 6 month follow up   Other orders -     Gabapentin ; Take 1 capsule (300 mg total) by mouth 2 (two) times daily.  Dispense: 180 capsule; Refill: 0     Follow-up:  Return in about 4 weeks (around 03/22/2024) for follow up diabetes.   Verneita LITTIE Kettering, MD

## 2024-03-04 DIAGNOSIS — L309 Dermatitis, unspecified: Secondary | ICD-10-CM | POA: Diagnosis not present

## 2024-03-04 DIAGNOSIS — D1722 Benign lipomatous neoplasm of skin and subcutaneous tissue of left arm: Secondary | ICD-10-CM | POA: Diagnosis not present

## 2024-03-08 ENCOUNTER — Ambulatory Visit: Attending: Cardiovascular Disease

## 2024-03-08 DIAGNOSIS — I5032 Chronic diastolic (congestive) heart failure: Secondary | ICD-10-CM | POA: Diagnosis not present

## 2024-03-08 LAB — ECHOCARDIOGRAM COMPLETE
AR max vel: 1.07 cm2
AV Area VTI: 1.1 cm2
AV Area mean vel: 1.11 cm2
AV Mean grad: 8 mmHg
AV Peak grad: 14.7 mmHg
Ao pk vel: 1.92 m/s
Area-P 1/2: 3.26 cm2
MV VTI: 1.38 cm2
S' Lateral: 4.5 cm

## 2024-03-08 NOTE — Assessment & Plan Note (Signed)
 Hx of recurrent LGTa NMIBC (dx 2018) S/p induction BCG 2018, 48yr mBCG completed in 2020   Last recurrence: 2018, LGTa Last cysto: 10/2022 - NED  Last imaging: CT 2024-NED  I reviewed her oncologic history and clinical notes. Agree with prior plan. Plan for interval office cystoscopy. Additionally, plan for interval CTU. If NED w/ negative cytology, consider transitioning to annual surveillance.

## 2024-03-08 NOTE — Progress Notes (Unsigned)
   03/11/2024 12:36 PM   Laura Reed Apr 15, 1961 [id number]  Reason for visit: Follow up hx of left UTUC   HPI: Initial follow up with me today, followed for a hx of left UTUC and NMIBC Last cysto in Apr 2024 - NED (w/ erythema along Right lateral wall, negative cytology)  Prior HPI: Previously followed by Dr. Penne, last see in 08/2022   Hx of Left UTUC s/p left robotic nephroureterectomy and combined ventral hernia repair on 10/31/2014    -path = pT1Nx UTUC, -sm  02/2015 - cysto NED; 05/2015 cysto NED, 08/2015- cysto NED, 11/2015- NED, CT Urogram 11/12/15 negative, poor quality of delayed phase.; 5/2-17- cysto with mild erythema following recent UTI; 02/2016 NED; 08/2016 Lg Ta TCC recurrence,  1 cm, multifocal.  RTG neg.  08/2016- TURBT for bladder 1 cm recurrence near bladder neck and left UO, multifocal.  R RTG negative. Mitomycin .  Artie. 10/2016- Induction BCG x 6 08/2017- Maintenance BCG x 3 02/2019- Maintenance BCG x 3 03/2020- Cysto with erythema on posterior bladder wall, bx c/w follicular cystitis, R RTG negative  Physical Exam: There were no vitals taken for this visit.   Constitutional:  Alert and oriented, No acute distress.  GU: ***  Laboratory Data: N/A  Pertinent Imaging: N/A   Assessment & Plan:    History of bladder cancer Assessment & Plan: Hx of recurrent LGTa NMIBC (dx 2018) S/p induction BCG 2018, 57yr mBCG completed in 2020   Last recurrence: 2018, LGTa Last cysto: 10/2022 - NED   I reviewed her oncologic history and clinical notes. Agree with prior plan. Plan for interval office cystoscopy. If NED w/ negative cytology, consider transitioning to annual surveillance.         Penne Laura Skye, MD  [facility] [address] [place], [place] [address] [phone]

## 2024-03-10 DIAGNOSIS — I4891 Unspecified atrial fibrillation: Secondary | ICD-10-CM | POA: Diagnosis not present

## 2024-03-11 ENCOUNTER — Ambulatory Visit (INDEPENDENT_AMBULATORY_CARE_PROVIDER_SITE_OTHER): Admitting: Urology

## 2024-03-11 ENCOUNTER — Other Ambulatory Visit (INDEPENDENT_AMBULATORY_CARE_PROVIDER_SITE_OTHER): Payer: Self-pay

## 2024-03-11 VITALS — BP 158/72 | HR 72 | Ht 67.0 in | Wt 163.8 lb

## 2024-03-11 DIAGNOSIS — R310 Gross hematuria: Secondary | ICD-10-CM | POA: Diagnosis not present

## 2024-03-11 DIAGNOSIS — Z8550 Personal history of malignant neoplasm of unspecified urinary tract organ: Secondary | ICD-10-CM | POA: Diagnosis not present

## 2024-03-11 DIAGNOSIS — Z8551 Personal history of malignant neoplasm of bladder: Secondary | ICD-10-CM | POA: Diagnosis not present

## 2024-03-11 DIAGNOSIS — Z08 Encounter for follow-up examination after completed treatment for malignant neoplasm: Secondary | ICD-10-CM

## 2024-03-11 MED ORDER — AMOXICILLIN-POT CLAVULANATE 875-125 MG PO TABS: 1.0000 | ORAL_TABLET | Freq: Two times a day (BID) | ORAL | 5 refills | Status: DC

## 2024-03-11 NOTE — Patient Instructions (Signed)
 Scheduling number: [phone]

## 2024-03-11 NOTE — Assessment & Plan Note (Signed)
 Hx of Left UTUC s/p left robotic nephroureterectomy and combined ventral hernia repair on 10/31/2014    -path = pT1Nx UTUC, -sm  - Primarily monitoring bladder cancer surveillance at this point -Plan for interval CT urogram as above, assess solitary right kidney and left renal fossa

## 2024-03-12 ENCOUNTER — Ambulatory Visit: Payer: Self-pay | Admitting: Cardiovascular Disease

## 2024-03-17 ENCOUNTER — Ambulatory Visit
Admission: RE | Admit: 2024-03-17 | Discharge: 2024-03-17 | Disposition: A | Discharge status: Home / Self Care | Source: Ambulatory Visit | Attending: Urology | Admitting: Urology

## 2024-03-17 DIAGNOSIS — N1330 Unspecified hydronephrosis: Secondary | ICD-10-CM | POA: Diagnosis not present

## 2024-03-17 DIAGNOSIS — Z8551 Personal history of malignant neoplasm of bladder: Secondary | ICD-10-CM | POA: Insufficient documentation

## 2024-03-17 DIAGNOSIS — Z905 Acquired absence of kidney: Secondary | ICD-10-CM | POA: Diagnosis not present

## 2024-03-17 DIAGNOSIS — N134 Hydroureter: Secondary | ICD-10-CM | POA: Diagnosis not present

## 2024-03-17 MED ORDER — IOHEXOL 300 MG/ML  SOLN
125.0000 mL | Freq: Once | INTRAMUSCULAR | Status: AC | PRN
  Administered 2024-03-17: 125 mL via INTRAVENOUS

## 2024-03-17 MED ORDER — SODIUM CHLORIDE 0.9 % IV BOLUS: 250.0000 mL | Freq: Once | INTRAVENOUS | Status: DC

## 2024-03-22 NOTE — Progress Notes (Signed)
   04/01/2024 2:04 PM   Laura Reed 14-Jun-1961 [id number]  Cystoscopy Procedure Note:  Indication:  Hx of Left UTUC s/p left robotic nephroureterectomy and combined ventral hernia repair on 10/31/2014    -path = pT1Nx UTUC, -sm  Hx of recurrent LGTa NMIBC (dx 2018) S/p induction BCG 2018, 34yr mBCG completed in 2020   Last recurrence: 2018, LGTa Last cysto: 10/2022 - NED  Last imaging: CT 2024-NED  After informed consent and discussion of the procedure and its risks, Laura Reed was positioned and prepped in the standard fashion. Cystoscopy was performed with a flexible cystoscope. The external vaginal anatomy, urethra, bladder neck and bladder mucosa were visualized in a systematic fashion. The Right UO noted in orthotopic location, left UO surgically absent.  No obvious bladder lesions.  I did note a focal area of erythema along the left posterior bladder wall (appeared more telangiectasia-like).  Urine cytology collected for this reason.  Imaging: Recent CTU  reviewed - 03/2024 - mild Right hydroureteronephrosis to level of UVJ, multiple Right renal calcifications (appears to be primarily vascular calcifications, significant peripheral arterial disease and aortic atherosclerosis)  Findings: Generally normal cystoscopy Small erythematous patch along left posterior bladder wall, likely folliculitis similar to prior biopsy  Assessment and Plan: - Follow-up urine cytology-only if abnormal will we pursue a repeat biopsy - If normal, continue annual cystoscopic surveillance - If NED through 2028 (10 years since recurrence) -we can certainly discuss spacing out intervals versus discontinuation altogether  Penne Skye, MD 03/22/2024

## 2024-03-25 ENCOUNTER — Telehealth: Payer: Self-pay

## 2024-03-25 NOTE — Telephone Encounter (Signed)
 Copied from CRM [id number]. Topic: Clinical - Medical Advice >> Mar 25, 2024  1:12 PM Harlene ORN wrote: Reason for CRM: Patient would like to speak with the PCP about her Freestyle Libre 2 Plus. [facility] has sent a request for her insulin  that she has a week left on.

## 2024-03-28 DIAGNOSIS — I4891 Unspecified atrial fibrillation: Secondary | ICD-10-CM

## 2024-03-29 MED ORDER — INSULIN LISPRO (1 UNIT DIAL) 100 UNIT/ML (KWIKPEN): PEN_INJECTOR | SUBCUTANEOUS | 1 refills | Status: AC

## 2024-03-29 NOTE — Addendum Note (Signed)
 Addended by: HARRIETTE RAISIN on: 03/29/2024 08:58 AM   Modules accepted: Orders

## 2024-03-29 NOTE — Telephone Encounter (Signed)
 Laura Reed data has been printed out and placed in yellow results folder for review. Humalog  has been refilled.

## 2024-03-31 ENCOUNTER — Encounter: Payer: Self-pay | Admitting: Internal Medicine

## 2024-04-01 ENCOUNTER — Ambulatory Visit: Admitting: Urology

## 2024-04-01 ENCOUNTER — Ambulatory Visit: Admitting: Internal Medicine

## 2024-04-01 ENCOUNTER — Encounter: Payer: Self-pay | Admitting: Emergency Medicine

## 2024-04-01 ENCOUNTER — Encounter: Payer: Self-pay | Admitting: Urology

## 2024-04-01 VITALS — BP 163/70 | HR 73 | Ht 67.0 in | Wt 165.4 lb

## 2024-04-01 DIAGNOSIS — Z8551 Personal history of malignant neoplasm of bladder: Secondary | ICD-10-CM

## 2024-04-01 LAB — MICROSCOPIC EXAMINATION

## 2024-04-01 LAB — URINALYSIS, COMPLETE
Bilirubin, UA: NEGATIVE
Ketones, UA: NEGATIVE
Leukocytes,UA: NEGATIVE
Nitrite, UA: NEGATIVE
Protein,UA: NEGATIVE
Specific Gravity, UA: <1.005 — ABNORMAL LOW (ref 1.005–1.030)
Urobilinogen, Ur: 0.2 mg/dL (ref 0.2–1.0)
pH, UA: 6 (ref 5.0–7.5)

## 2024-04-08 LAB — CYTOLOGY PLUS MONITORING PROFILE: PAP & FEULGEN

## 2024-04-11 DIAGNOSIS — H35371 Puckering of macula, right eye: Secondary | ICD-10-CM | POA: Diagnosis not present

## 2024-04-11 DIAGNOSIS — Z961 Presence of intraocular lens: Secondary | ICD-10-CM | POA: Diagnosis not present

## 2024-04-11 DIAGNOSIS — E113553 Type 2 diabetes mellitus with stable proliferative diabetic retinopathy, bilateral: Secondary | ICD-10-CM | POA: Diagnosis not present

## 2024-04-14 ENCOUNTER — Encounter: Payer: Self-pay | Admitting: Internal Medicine

## 2024-04-14 ENCOUNTER — Ambulatory Visit (INDEPENDENT_AMBULATORY_CARE_PROVIDER_SITE_OTHER): Admitting: Internal Medicine

## 2024-04-14 VITALS — BP 142/66 | HR 76 | Ht 67.0 in | Wt 168.0 lb

## 2024-04-14 DIAGNOSIS — E119 Type 2 diabetes mellitus without complications: Secondary | ICD-10-CM

## 2024-04-14 DIAGNOSIS — Z794 Long term (current) use of insulin: Secondary | ICD-10-CM

## 2024-04-14 DIAGNOSIS — I129 Hypertensive chronic kidney disease with stage 1 through stage 4 chronic kidney disease, or unspecified chronic kidney disease: Secondary | ICD-10-CM

## 2024-04-14 DIAGNOSIS — I5032 Chronic diastolic (congestive) heart failure: Secondary | ICD-10-CM

## 2024-04-14 DIAGNOSIS — E1121 Type 2 diabetes mellitus with diabetic nephropathy: Secondary | ICD-10-CM

## 2024-04-14 DIAGNOSIS — E1165 Type 2 diabetes mellitus with hyperglycemia: Secondary | ICD-10-CM | POA: Diagnosis not present

## 2024-04-14 DIAGNOSIS — Z23 Encounter for immunization: Secondary | ICD-10-CM

## 2024-04-14 LAB — BASIC METABOLIC PANEL WITH GFR
BUN: 37 mg/dL — ABNORMAL HIGH (ref 6–23)
CO2: 29 meq/L (ref 19–32)
Calcium: 9.1 mg/dL (ref 8.4–10.5)
Chloride: 101 meq/L (ref 96–112)
Creatinine, Ser: 0.8 mg/dL (ref 0.40–1.20)
GFR: 77.23 mL/min (ref >60.00)
Glucose, Bld: 168 mg/dL — ABNORMAL HIGH (ref 70–99)
Potassium: 4.1 meq/L (ref 3.5–5.1)
Sodium: 138 meq/L (ref 135–145)

## 2024-04-14 MED ORDER — TRESIBA FLEXTOUCH 100 UNIT/ML ~~LOC~~ SOPN: 25.0000 [IU] | PEN_INJECTOR | Freq: Every day | SUBCUTANEOUS | 2 refills | Status: DC

## 2024-04-14 MED ORDER — ATORVASTATIN CALCIUM 20 MG PO TABS: ORAL_TABLET | ORAL | 1 refills | Status: AC

## 2024-04-14 MED ORDER — TELMISARTAN 20 MG PO TABS: 20.0000 mg | ORAL_TABLET | Freq: Every day | ORAL | 0 refills | Status: DC

## 2024-04-14 MED ORDER — PEN NEEDLES 32G X 4 MM MISC: 3 refills | Status: AC

## 2024-04-14 MED ORDER — GABAPENTIN 600 MG PO TABS
600.0000 mg | ORAL_TABLET | Freq: Three times a day (TID) | ORAL | 0 refills | Status: DC
Start: 2024-04-14 — End: 2024-05-12

## 2024-04-14 MED ORDER — EMPAGLIFLOZIN 25 MG PO TABS: ORAL_TABLET | ORAL | 1 refills | Status: AC

## 2024-04-14 NOTE — Assessment & Plan Note (Addendum)
 Improving control currently,  attributed to increased appetite and intake of junk food since she quit smoking. Her evening blood sugars  have been quite labile and she has  a history of severe hypoglycemia in the past which  makes her understandably apprehensive about taking larger doses of f mealtime insulin .  I will advise her to increase  Tresiba  to 28 units and  increase the pre dinner humalog   dose to 8 units sliding scale to 8 units for pre dinner

## 2024-04-14 NOTE — Patient Instructions (Addendum)
 I am starting you on  a blood pressure medication called telmisartan ,  take it once daily and return in 7-10 days to have a repeat blood test and to have your blood pressure measured    INCREASE YOUR GABAPENTIN  TO 600 MG   UP TO 3 TIMES DAILY USING THE PILLS YOU HAVE TO SEE IF HELPS YOUR NERVE PAIN    FOR YOUR DIABETES:  INCREASE TRESIBA   TO 28 UNITS DAILY   USE 8 UNITS OF NOVOLOG  AT DINNERTIME

## 2024-04-14 NOTE — Progress Notes (Signed)
 Subjective:  Patient ID: Laura Reed, female    DOB: 1960/08/01  Age: 63 y.o. MRN: [id number]  CC: The primary encounter diagnosis was Insulin -requiring or dependent type II diabetes mellitus (HCC). Diagnoses of Type 2 diabetes mellitus with hyperglycemia, with long-term current use of insulin  (HCC), Renal hypertension, Need for influenza vaccination, Diabetic nephropathy with proteinuria (HCC), and Chronic diastolic CHF (congestive heart failure) (HCC) were also pertinent to this visit.   HPI CARLISS PORCARO presents for  Chief Complaint  Patient presents with   Medical Management of Chronic Issues    1 month follow up on diabetes   Follow up on type 2 DM/  At last visit I  advised her to increase  Tresiba  to 25 units and  continue the current  humalog  sliding scale to 8 units for pre dinner CBG 150 or higher  10 units for CBG 200 or higher, and 14 units for CBG 240 or higher  and return in 2 weeks .    Still having left leg pain , anterior thigh ,  feels electrical   still has inner thigh swelliing  after wound vac removed in march   no redness or pain in that area. sees Teaching laboratory technician in November. Using gabapentin  300 mg bid,  not healing , but used to be on 800 mg  in the past    HTN:  has not been checking BP at home.  Office readings have been   Outpatient Medications Prior to Visit  Medication Sig Dispense Refill   acetaminophen  (TYLENOL ) 500 MG tablet Take 1,000 mg by mouth every 6 (six) hours as needed for mild pain or headache.      acidophilus (RISAQUAD) CAPS capsule Take 2 capsules by mouth 2 (two) times daily.     amoxicillin -clavulanate (AUGMENTIN ) 875-125 MG tablet Take 1 tablet by mouth 2 (two) times daily. 28 tablet 5   apixaban  (ELIQUIS ) 2.5 MG TABS tablet Take 1 tablet (2.5 mg total) by mouth 2 (two) times daily. 60 tablet 11   aspirin  81 MG chewable tablet Chew 81 mg by mouth at bedtime.      Cholecalciferol  (VITAMIN D3) 2000 UNITS TABS Take 2,000 Units by mouth daily  after supper.      clobetasol ointment (TEMOVATE) 0.05 % Apply topically.     co-enzyme Q-10 30 MG capsule Take 30 mg by mouth daily.     Continuous Glucose Sensor (FREESTYLE LIBRE 2 SENSOR) MISC Use to check sugar at least 4 times daily 2 each 2   gabapentin  (NEURONTIN ) 300 MG capsule Take 1 capsule (300 mg total) by mouth 2 (two) times daily. 180 capsule 0   glucose blood test strip Use you check blood sugars up to four times daily. 100 each 12   insulin  degludec (TRESIBA  FLEXTOUCH) 100 UNIT/ML FlexTouch Pen Inject 15 Units into the skin daily. Reduced from 22 units daily.     insulin  lispro (HUMALOG  KWIKPEN) 100 UNIT/ML KwikPen ADMINISTER 5-10 UNITS UNDER THE SKIN THREE TIMES DAILY if you eat a meal.  5 units if blood sugar <200, 10 units if blood sugar >200. 15 mL 1   mometasone  (ELOCON ) 0.1 % cream APPLY TO THE AFFECTED AREA TWICE DAILY FOR EAR CANAL AS NEEDED FOR ITCHING 15 g 1   Multiple Vitamin (MULTIVITAMIN) tablet Take 1 tablet by mouth daily.     polycarbophil (FIBERCON) 625 MG tablet Take 1 tablet (625 mg total) by mouth 2 (two) times daily. For stool bulking. 60 tablet 0  triamcinolone  ointment (KENALOG ) 0.5 % APPLY TOPICALLY TO THE AFFECTED AREA TWICE DAILY 75 g 2   atorvastatin  (LIPITOR) 20 MG tablet TAKE 1 TABLET(20 MG) BY MOUTH DAILY 90 tablet 1   empagliflozin  (JARDIANCE ) 25 MG TABS tablet TAKE 1 TABLET(25 MG) BY MOUTH DAILY BEFORE AND BREAKFAST 90 tablet 0   insulin  degludec (TRESIBA  FLEXTOUCH) 100 UNIT/ML FlexTouch Pen Inject 25 Units into the skin daily. 15 mL 2   Insulin  Pen Needle (PEN NEEDLES) 32G X 4 MM MISC Use to take insulin  daily 100 each 3   No facility-administered medications prior to visit.    Review of Systems;  Patient denies headache, fevers, malaise, unintentional weight loss, skin rash, eye pain, sinus congestion and sinus pain, sore throat, dysphagia,  hemoptysis , cough, dyspnea, wheezing, chest pain, palpitations, orthopnea, edema, abdominal pain,  nausea, melena, diarrhea, constipation, flank pain, dysuria, hematuria, urinary  Frequency, nocturia, numbness, tingling, seizures,  Focal weakness, Loss of consciousness,  Tremor, insomnia, depression, anxiety, and suicidal ideation.      Objective:  BP (!) 142/66   Pulse 76   Ht 5' 7 (1.702 m)   Wt 168 lb (76.2 kg)   SpO2 96%   BMI 26.31 kg/m   BP Readings from Last 3 Encounters:  04/14/24 (!) 142/66  04/01/24 (!) 163/70  03/11/24 (!) 158/72    Wt Readings from Last 3 Encounters:  04/14/24 168 lb (76.2 kg)  04/01/24 165 lb 6.4 oz (75 kg)  03/11/24 163 lb 12.8 oz (74.3 kg)    Physical Exam Vitals reviewed.  Constitutional:      General: She is not in acute distress.    Appearance: Normal appearance. She is normal weight. She is not ill-appearing, toxic-appearing or diaphoretic.  HENT:     Head: Normocephalic.  Eyes:     General: No scleral icterus.       Right eye: No discharge.        Left eye: No discharge.     Conjunctiva/sclera: Conjunctivae normal.  Cardiovascular:     Rate and Rhythm: Normal rate and regular rhythm.     Heart sounds: Normal heart sounds.  Pulmonary:     Effort: Pulmonary effort is normal. No respiratory distress.     Breath sounds: Normal breath sounds.  Musculoskeletal:        General: Normal range of motion.  Skin:    General: Skin is warm and dry.  Neurological:     General: No focal deficit present.     Mental Status: She is alert and oriented to person, place, and time. Mental status is at baseline.  Psychiatric:        Mood and Affect: Mood normal.        Behavior: Behavior normal.        Thought Content: Thought content normal.        Judgment: Judgment normal.     Lab Results  Component Value Date   HGBA1C 8.0 (H) 02/08/2024   HGBA1C 8.6 (H) 11/04/2023   HGBA1C 6.9 (A) 08/06/2023    Lab Results  Component Value Date   CREATININE 0.80 04/14/2024   CREATININE 0.76 02/08/2024   CREATININE 0.92 11/04/2023    Lab  Results  Component Value Date   WBC 9.6 08/21/2023   HGB 12.4 08/21/2023   HCT 37.8 08/21/2023   PLT 315.0 08/21/2023   GLUCOSE 168 (H) 04/14/2024   CHOL 142 02/08/2024   TRIG 72.0 02/08/2024   HDL 67.80 02/08/2024   LDLDIRECT 57.0  02/08/2024   LDLCALC 59 02/08/2024   ALT 22 02/08/2024   AST 22 02/08/2024   NA 138 04/14/2024   K 4.1 04/14/2024   CL 101 04/14/2024   CREATININE 0.80 04/14/2024   BUN 37 (H) 04/14/2024   CO2 29 04/14/2024   TSH 0.864 06/22/2023   INR 1.1 06/21/2023   HGBA1C 8.0 (H) 02/08/2024   MICROALBUR 12.5 (H) 11/04/2023    CT HEMATURIA WORKUP Result Date: 03/19/2024 CLINICAL DATA:  History of bladder cancer and urothelial cancer, status post left nephrectomy * Tracking Code: BO * EXAM: CT ABDOMEN AND PELVIS WITHOUT AND WITH CONTRAST TECHNIQUE: Multidetector CT imaging of the abdomen and pelvis was performed following the standard protocol before and following the bolus administration of intravenous contrast. RADIATION DOSE REDUCTION: This exam was performed according to the departmental dose-optimization program which includes automated exposure control, adjustment of the mA and/or kV according to patient size and/or use of iterative reconstruction technique. CONTRAST:  OMNIPAQUE  IOHEXOL  300 MG/ML  SOLN COMPARISON:  06/22/2023 FINDINGS: Lower chest: No acute abnormality.  Coronary artery calcifications. Hepatobiliary: No focal liver abnormality is seen. Status post cholecystectomy. No biliary dilatation. Pancreas: Severely atrophic, calcified, cystic pancreas. No acute inflammatory findings. Spleen: Normal in size without significant abnormality. Adrenals/Urinary Tract: Unchanged benign left adrenal adenoma (series 9, image 19). Status post left nephrectomy. No suspicious soft tissue or contrast enhancement in the nephrectomy bed. Numerous tiny nonobstructive right renal calculi or renal vascular calcifications. Mild right hydronephrosis and hydroureter to the  ureterovesicular junction without calculus or other visible obstruction (series 9, image 33). No urinary tract filling defect on delayed phase imaging. Bladder is unremarkable. Stomach/Bowel: Stomach is within normal limits. Appendix appears normal. No evidence of bowel wall thickening, distention, or inflammatory changes. Vascular/Lymphatic: Severe, pipe like aortic atherosclerosis and extensive vascular calcinosis. Bilateral common and external iliac artery stents. Femoral femoral bypass grafting. Bilateral femoropopliteal bypass graft, incompletely imaged. No enlarged abdominal or pelvic lymph nodes. Reproductive: No mass or other significant abnormality. Other: No abdominal wall hernia or abnormality. No ascites. Unchanged presacral cystic lesion, stable over numerous prior examinations and consistent with a benign tail gut duplication cysts requiring no specific further follow-up or characterization (series 9, image 61). Musculoskeletal: No acute or significant osseous findings. IMPRESSION: 1. Status post left nephrectomy. No suspicious soft tissue or contrast enhancement in the nephrectomy bed. 2. No evidence of lymphadenopathy or metastatic disease in the abdomen or pelvis. 3. Mild right hydronephrosis and hydroureter to the ureterovesicular junction without calculus or other visible obstruction. This may reflect sequelae of recently passed calculus but is otherwise of uncertain significance. 4. Numerous tiny nonobstructive right renal calculi or renal vascular calcifications. 5. Severely atrophic, calcified, cystic pancreas, consistent with chronic sequelae of pancreatitis. 6. Coronary artery disease. 7. Severe atherosclerosis. Aortic Atherosclerosis (ICD10-I70.0). Electronically Signed   By: Marolyn JONETTA Jaksch M.D.   On: 03/19/2024 14:20    Assessment & Plan:  .Insulin -requiring or dependent type II diabetes mellitus (HCC) Assessment & Plan: Improving control currently,  attributed to increased appetite  and intake of junk food since she quit smoking. Her evening blood sugars  have been quite labile and she has  a history of severe hypoglycemia in the past which  makes her understandably apprehensive about taking larger doses of f mealtime insulin .  I will advise her to increase  Tresiba  to 28 units and  increase the pre dinner humalog   dose to 8 units sliding scale to 8 units for pre dinner  Type 2 diabetes mellitus with hyperglycemia, with long-term current use of insulin  (HCC) -     Empagliflozin ; TAKE 1 TABLET(25 MG) BY MOUTH DAILY BEFORE AND BREAKFAST  Dispense: 90 tablet; Refill: 1 -     Tresiba  FlexTouch; Inject 25 Units into the skin daily.  Dispense: 15 mL; Refill: 2 -     Pen Needles; Use to take insulin  daily  Dispense: 100 each; Refill: 3  Renal hypertension -     Basic metabolic panel with GFR -     Basic metabolic panel with GFR; Future  Need for influenza vaccination -     Flu vaccine HIGH DOSE PF(Fluzone  Trivalent)  Diabetic nephropathy with proteinuria (HCC) Assessment & Plan: Previosuly managed with Lisinopril  20 mg daily  which has been held due to histor yof hypotension..  starting telmisartan for blood pressure control.  .  She is s/p nephrectomy for renal cell carcinoma.  renal function is normal     Lab Results  Component Value Date   MICROALBUR 12.5 (H) 11/04/2023   Lab Results  Component Value Date   CREATININE 0.80 04/14/2024      Chronic diastolic CHF (congestive heart failure) (HCC) Assessment & Plan: She has no signs of LE edema , but BP is elevated.  Starting telmisartan.    Other orders -     Atorvastatin  Calcium ; TAKE 1 TABLET(20 MG) BY MOUTH DAILY  Dispense: 90 tablet; Refill: 1 -     Gabapentin ; Take 1 tablet (600 mg total) by mouth 3 (three) times daily.  Dispense: 90 tablet; Refill: 0 -     Telmisartan; Take 1 tablet (20 mg total) by mouth daily.  Dispense: 30 tablet; Refill: 0    Follow-up: Return in about 4 weeks (around 05/12/2024) for  follow up diabetes.   Verneita LITTIE Kettering, MD

## 2024-04-16 ENCOUNTER — Ambulatory Visit: Payer: Self-pay | Admitting: Internal Medicine

## 2024-04-16 NOTE — Assessment & Plan Note (Signed)
 She has no signs of LE edema , but BP is elevated.  Starting telmisartan.

## 2024-04-16 NOTE — Assessment & Plan Note (Signed)
 Previosuly managed with Lisinopril  20 mg daily  which has been held due to histor yof hypotension..  starting telmisartan for blood pressure control.  .  She is s/p nephrectomy for renal cell carcinoma.  renal function is normal     Lab Results  Component Value Date   MICROALBUR 12.5 (H) 11/04/2023   Lab Results  Component Value Date   CREATININE 0.80 04/14/2024

## 2024-04-21 ENCOUNTER — Telehealth: Payer: Self-pay
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
## 2024-04-21 NOTE — Telephone Encounter (Signed)
 LMTCB

## 2024-04-22 ENCOUNTER — Ambulatory Visit

## 2024-04-25 NOTE — Telephone Encounter (Signed)
 LMTCB
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
## 2024-04-26 NOTE — Telephone Encounter (Signed)
 LMTCB. Please confirm with pt as to whether or not she is taking her Telmisartan 20 mg daily.

## 2024-04-26 NOTE — Telephone Encounter (Signed)
 Pt called back with BP readings...  10/15  153/58  61            158/84  66 10/17  114/69  69 10/18  151/82  66  9:15 am            96/47    75  2 pm felt lightheaded 10/19  143/77  63  9 am            113/64  73  9 pm 10/20  163/85  65  9 am 10/21  138/79  61  10 am

## 2024-04-27 NOTE — Telephone Encounter (Signed)
 LMTCB. Please confirm with pt as to whether or not she is taking her Telmisartan 20 mg daily.

## 2024-04-29 DIAGNOSIS — E1142 Type 2 diabetes mellitus with diabetic polyneuropathy: Secondary | ICD-10-CM | POA: Diagnosis not present

## 2024-05-06 ENCOUNTER — Encounter: Payer: Self-pay | Admitting: Pharmacist

## 2024-05-06 NOTE — Progress Notes (Signed)
 Pharmacy Quality Measure Review  This patient is appearing on a report for being at risk of failing the adherence measure for cholesterol (statin) and diabetes medications this calendar year.   Medication: Jardiance  25 mg, atorvastatin  20 mg Last fill date: 02/02/24, 02/03/24 for 90 day supply  Insurance report was not up to date. No action needed at this time.  Both medication refilled as of 04/14/24 x90 ds. Next refill due 2026.

## 2024-05-09 ENCOUNTER — Telehealth (INDEPENDENT_AMBULATORY_CARE_PROVIDER_SITE_OTHER): Payer: Self-pay | Admitting: Vascular Surgery

## 2024-05-09 ENCOUNTER — Ambulatory Visit (INDEPENDENT_AMBULATORY_CARE_PROVIDER_SITE_OTHER)

## 2024-05-09 ENCOUNTER — Encounter (INDEPENDENT_AMBULATORY_CARE_PROVIDER_SITE_OTHER): Payer: Self-pay | Admitting: Vascular Surgery

## 2024-05-09 ENCOUNTER — Ambulatory Visit (INDEPENDENT_AMBULATORY_CARE_PROVIDER_SITE_OTHER): Admitting: Vascular Surgery

## 2024-05-09 VITALS — BP 142/63 | HR 67 | Resp 18 | Ht 67.0 in | Wt 177.2 lb

## 2024-05-09 DIAGNOSIS — I70213 Atherosclerosis of native arteries of extremities with intermittent claudication, bilateral legs: Secondary | ICD-10-CM | POA: Diagnosis not present

## 2024-05-09 DIAGNOSIS — I6523 Occlusion and stenosis of bilateral carotid arteries: Secondary | ICD-10-CM | POA: Diagnosis not present

## 2024-05-09 DIAGNOSIS — E1151 Type 2 diabetes mellitus with diabetic peripheral angiopathy without gangrene: Secondary | ICD-10-CM | POA: Diagnosis not present

## 2024-05-09 DIAGNOSIS — I25118 Atherosclerotic heart disease of native coronary artery with other forms of angina pectoris: Secondary | ICD-10-CM | POA: Diagnosis not present

## 2024-05-09 DIAGNOSIS — I10 Essential (primary) hypertension: Secondary | ICD-10-CM

## 2024-05-09 NOTE — Progress Notes (Signed)
 MRN : [id number]  Laura Reed is a 63 y.o. ([birth year]) female who presents with chief complaint of check circulation.  History of Present Illness:   The patient returns to the office for followup regarding atherosclerotic changes of the lower extremities and review of the noninvasive studies.    There have been no interval changes in lower extremity symptoms. No interval shortening of the patient's claudication distance or development of rest pain symptoms. No new ulcers or wounds have occurred since the last visit.   Left groin has been stable it remains healed no drainage no pain   There have been no significant changes to the patient's overall health care.   The patient denies amaurosis fugax or recent TIA symptoms. There are no documented recent neurological changes noted. There is no history of DVT, PE or superficial thrombophlebitis. The patient denies recent episodes of angina or shortness of breath.    ABI Rt=0.96 and Lt=0.88  (previous ABI Rt=0.88 and Lt=0.90) Duplex ultrasound of the femoral-femoral bypass with bilateral femoropopliteal's all grafts are patent with uniform flow in the femorofemoral and right femoropopliteal bypasses.  On the left the femoral-popliteal bypass does show elevation in the velocities at the distal anastomosis.  No outpatient medications have been marked as taking for the 05/09/24 encounter (Appointment) with Jama, Cordella MATSU, MD.    Past Medical History:  Diagnosis Date   Abscess of groin, left 06/26/2023   Absence of kidney    left   Anxiety    Arthritis    Atherosclerosis of native arteries of extremities with intermittent claudication, bilateral legs    Bladder cancer (HCC)    Cataract    CHF (congestive heart failure) (HCC)    Complication of anesthesia    BP HAS  RUN LOW AFTER SURGERY-LUNGS FILLED UP WITH FLUID AFTER  LEG STENT SURGERY    Coronary artery  disease    Diabetes mellitus    Family history of adverse reaction to anesthesia    Sister - PONV   GERD (gastroesophageal reflux disease)    OCC TUMS   Heart murmur    Hemorrhoid    History of methicillin resistant staphylococcus aureus (MRSA) 2007   Hypertension    Neuromuscular disorder (HCC)    Neuropathy    PVD (peripheral vascular disease)    Thyroid  nodule    right   Unspecified osteoarthritis, unspecified site    Urothelial carcinoma of kidney (HCC) 10/31/2014   INVASIVE UROTHELIAL CARCINOMA, LOW GRADE. T1, Nx.   Vitamin D  deficiency, unspecified    Wears dentures    full upper and lower    Past Surgical History:  Procedure Laterality Date   AMPUTATION TOE     right (4th and 5th); left (great toe, 3rd)   AMPUTATION TOE Right 07/16/2018   Procedure: AMPUTATION TOE/MPJ right 2nd;  Surgeon: Neill Boas, DPM;  Location: [facility];  Service: Podiatry;  Laterality: Right;   APPLICATION OF WOUND VAC Left 06/26/2023   Procedure: WOUND VAC EXCHANGE LEFT GROIN;  Surgeon: Jama Cordella MATSU, MD;  Location: [facility];  Service: Vascular;  Laterality: Left;  APPLICATION OF WOUND VAC Left 06/29/2023   Procedure: WOUND VAC EXCHANGE LEFT GROIN;  Surgeon: Jama Cordella MATSU, MD;  Location: [facility];  Service: Vascular;  Laterality: Left;   APPLICATION OF WOUND VAC Left 07/02/2023   Procedure: WOUND VAC EXCHANGE LEFT GROIN;  Surgeon: Marea Selinda RAMAN, MD;  Location: [facility];  Service: Vascular;  Laterality: Left;   APPLICATION OF WOUND VAC Left 07/06/2023   Procedure: WOUND VAC EXCHANGE LEFT GROIN;  Surgeon: Jama Cordella MATSU, MD;  Location: [facility];  Service: Vascular;  Laterality: Left;   APPLICATION OF WOUND VAC Left 07/16/2023   Procedure: WOUND VAC EXCHANGE LEFT GROIN;  Surgeon: Jama Cordella MATSU, MD;  Location: [facility];  Service: Vascular;  Laterality: Left;   APPLICATION OF WOUND VAC Left 07/23/2023   Procedure: WOUND VAC EXCHANGE;  Surgeon: Jama Cordella MATSU, MD;  Location:  [facility];  Service: Vascular;  Laterality: Left;   APPLICATION OF WOUND VAC Left 07/13/2023   Procedure: WOUND VAC EXCHANGE LEFT GROIN;  Surgeon: Jama Cordella MATSU, MD;  Location: [facility];  Service: Vascular;  Laterality: Left;   APPLICATION OF WOUND VAC Left 07/09/2023   Procedure: WOUND VAC EXCHANGE OF LEFT GROIN;  Surgeon: Marea Selinda RAMAN, MD;  Location: [facility];  Service: Vascular;  Laterality: Left;   APPLICATION OF WOUND VAC Left 07/20/2023   Procedure: WOUND VAC EXCHANGE;  Surgeon: Jama Cordella MATSU, MD;  Location: [facility];  Service: Vascular;  Laterality: Left;   ARTERIAL BYPASS SURGRY  2009, 2013 x 2   right leg , done in [place]   CARDIAC CATHETERIZATION     CAROTID ENDARTERECTOMY Right 01/2014   Dr Jama   CATARACT EXTRACTION W/PHACO Right 12/14/2014   Procedure: CATARACT EXTRACTION PHACO AND INTRAOCULAR LENS PLACEMENT (IOC);  Surgeon: Newell Ovens, MD;  Location: [facility];  Service: Ophthalmology;  Laterality: Right;  US    00:38.6              AP        7.1                   CDE  2.76   CATARACT EXTRACTION W/PHACO Left 12/06/2019   Procedure: CATARACT EXTRACTION PHACO AND INTRAOCULAR LENS PLACEMENT (IOC) LEFT DIABETIC;  Surgeon: Jaye Fallow, MD;  Location: [facility];  Service: Ophthalmology;  Laterality: Left;  9.08 1:06.4   CESAREAN SECTION     CHOLECYSTECTOMY  03/03/2012   Porcelain gallbladder, gallstones,  [place]   COLONOSCOPY W/ BIOPSIES  04/28/2012   Hyperplastic rectal polyps.   COLONOSCOPY WITH PROPOFOL  N/A 04/02/2022   Procedure: COLONOSCOPY WITH PROPOFOL ;  Surgeon: Dessa Reyes ORN, MD;  Location: [facility];  Service: Endoscopy;  Laterality: N/A;   CORONARY ARTERY BYPASS GRAFT  2009   3 vessel   CYSTOSCOPY W/ RETROGRADES Right 09/01/2016   Procedure: CYSTOSCOPY WITH RETROGRADE PYELOGRAM;  Surgeon: Rosina Riis, MD;  Location: [facility];  Service: Urology;  Laterality: Right;   CYSTOSCOPY W/ RETROGRADES Bilateral 03/19/2020    Procedure: CYSTOSCOPY WITH RETROGRADE PYELOGRAM;  Surgeon: Riis Rosina, MD;  Location: [facility];  Service: Urology;  Laterality: Bilateral;   CYSTOSCOPY WITH BIOPSY N/A 03/19/2020   Procedure: CYSTOSCOPY WITH BIOPSY;  Surgeon: Riis Rosina, MD;  Location: [facility];  Service: Urology;  Laterality: N/A;   EYE SURGERY     FEMORAL-POPLITEAL BYPASS GRAFT Left 04/29/2023   Procedure: BYPASS GRAFT FEMORAL-POPLITEAL ARTERY;  Surgeon: Jama Cordella MATSU, MD;  Location: [facility];  Service: Vascular;  Laterality: Left;  HERNIA REPAIR  10/31/2014   ventral, retro-rectus atrium mesh   INCISION AND DRAINAGE ABSCESS Left 06/24/2023   Procedure: INCISION AND DRAINAGE WITH SATORIUS FLAP;  Surgeon: Jama Cordella MATSU, MD;  Location: [facility];  Service: Vascular;  Laterality: Left;   IRRIGATION AND DEBRIDEMENT FOOT Left 01/18/2019   Procedure: IRRIGATION AND DEBRIDEMENT FOOT;  Surgeon: Neill Boas, DPM;  Location: [facility];  Service: Podiatry;  Laterality: Left;   LOWER EXTREMITY ANGIOGRAPHY Left 12/10/2016   Procedure: Lower Extremity Angiography;  Surgeon: Jama Cordella MATSU, MD;  Location: [facility];  Service: Cardiovascular;  Laterality: Left;   LOWER EXTREMITY ANGIOGRAPHY Left 02/02/2018   Procedure: LOWER EXTREMITY ANGIOGRAPHY;  Surgeon: Jama Cordella MATSU, MD;  Location: [facility];  Service: Cardiovascular;  Laterality: Left;   LOWER EXTREMITY ANGIOGRAPHY Left 05/05/2018   Procedure: LOWER EXTREMITY ANGIOGRAPHY;  Surgeon: Jama Cordella MATSU, MD;  Location: [facility];  Service: Cardiovascular;  Laterality: Left;   LOWER EXTREMITY ANGIOGRAPHY Left 12/04/2020   Procedure: LOWER EXTREMITY ANGIOGRAPHY with Intervention;  Surgeon: Jama Cordella MATSU, MD;  Location: [facility];  Service: Cardiovascular;  Laterality: Left;   LOWER EXTREMITY ANGIOGRAPHY Left 04/24/2023   Procedure: Lower Extremity Angiography;  Surgeon: Jama Cordella MATSU, MD;  Location: [facility];  Service: Cardiovascular;  Laterality: Left;   LOWER EXTREMITY ANGIOGRAPHY Left 05/01/2023   Procedure: Lower Extremity Angiography;  Surgeon: Jama Cordella MATSU, MD;  Location: [facility];  Service: Cardiovascular;  Laterality: Left;   NEPHRECTOMY Left 10/31/2014   PERIPHERAL VASCULAR CATHETERIZATION Left 05/01/2015   Procedure: Lower Extremity Angiography;  Surgeon: Cordella MATSU Jama, MD;  Location: [facility];  Service: Cardiovascular;  Laterality: Left;   PERIPHERAL VASCULAR CATHETERIZATION  05/01/2015   Procedure: Lower Extremity Intervention;  Surgeon: Cordella MATSU Jama, MD;  Location: [facility];  Service: Cardiovascular;;   PERIPHERAL VASCULAR CATHETERIZATION Left 02/20/2015   Procedure: Pelvic Angiography;  Surgeon: Cordella MATSU Jama, MD;  Location: [facility];  Service: Cardiovascular;  Laterality: Left;   TRANSMETATARSAL AMPUTATION Left 05/05/2023   Procedure: TRANSMETATARSAL AMPUTATION LEFT;  Surgeon: Ashley Soulier, DPM;  Location: [facility];  Service: Orthopedics/Podiatry;  Laterality: Left;   TRANSURETHRAL RESECTION OF BLADDER TUMOR WITH MITOMYCIN -C N/A 09/01/2016   Procedure: TRANSURETHRAL RESECTION OF BLADDER TUMOR WITH MITOMYCIN -C;  Surgeon: Rosina Riis, MD;  Location: [facility];  Service: Urology;  Laterality: N/A;   TUBAL LIGATION  1992    Social History Social History   Tobacco Use   Smoking status: Former    Current packs/day: 0.00    Average packs/day: 2.0 packs/day for 35.0 years (70.0 ttl pk-yrs)    Types: Cigarettes    Start date: 03/29/1978    Quit date: 03/29/2013    Years since quitting: 11.1   Smokeless tobacco: Never  Vaping Use   Vaping status: Former  Substance Use Topics   Alcohol use: Not Currently    Alcohol/week: 0.0 standard drinks of alcohol    Comment: LAST DRINK 2009   Drug use: Not Currently    Types: Cocaine    Comment: last used in 2007    Family History Family History   Problem Relation Age of Onset   Cancer Mother        Lung Cancer   Cancer Father        Lung Ca   Diabetes Son    Breast cancer Maternal Grandmother    Kidney cancer Neg Hx  Bladder Cancer Neg Hx    Prostate cancer Neg Hx     No Known Allergies   REVIEW OF SYSTEMS (Negative unless checked)  Constitutional: [] Weight loss  [] Fever  [] Chills Cardiac: [] Chest pain   [] Chest pressure   [] Palpitations   [] Shortness of breath when laying flat   [] Shortness of breath with exertion. Vascular:  [x] Pain in legs with walking   [] Pain in legs at rest  [] History of DVT   [] Phlebitis   [] Swelling in legs   [] Varicose veins   [] Non-healing ulcers Pulmonary:   [] Uses home oxygen   [] Productive cough   [] Hemoptysis   [] Wheeze  [] COPD   [] Asthma Neurologic:  [] Dizziness   [] Seizures   [] History of stroke   [] History of TIA  [] Aphasia   [] Vissual changes   [] Weakness or numbness in arm   [] Weakness or numbness in leg Musculoskeletal:   [] Joint swelling   [] Joint pain   [] Low back pain Hematologic:  [] Easy bruising  [] Easy bleeding   [] Hypercoagulable state   [] Anemic Gastrointestinal:  [] Diarrhea   [] Vomiting  [] Gastroesophageal reflux/heartburn   [] Difficulty swallowing. Genitourinary:  [] Chronic kidney disease   [] Difficult urination  [] Frequent urination   [] Blood in urine Skin:  [] Rashes   [] Ulcers  Psychological:  [] History of anxiety   []  History of major depression.  Physical Examination  There were no vitals filed for this visit. There is no height or weight on file to calculate BMI. Gen: WD/WN, NAD Head: [place]/AT, No temporalis wasting.  Ear/Nose/Throat: Hearing grossly intact, nares w/o erythema or drainage Eyes: PER, EOMI, sclera nonicteric.  Neck: Supple, no masses.  No bruit or JVD.  Pulmonary:  Good air movement, no audible wheezing, no use of accessory muscles.  Cardiac: RRR, normal S1, S2, no Murmurs. Vascular:  mild trophic changes, no open wounds Vessel Right Left  Radial  Palpable Palpable  PT Not Palpable Not Palpable  DP Not Palpable Not Palpable  Gastrointestinal: soft, non-distended. No guarding/no peritoneal signs.  Musculoskeletal: M/S 5/5 throughout.  No visible deformity.  Neurologic: CN 2-12 intact. Pain and light touch intact in extremities.  Symmetrical.  Speech is fluent. Motor exam as listed above. Psychiatric: Judgment intact, Mood & affect appropriate for pt's clinical situation. Dermatologic: No rashes or ulcers noted.  No changes consistent with cellulitis.   CBC Lab Results  Component Value Date   WBC 9.6 08/21/2023   HGB 12.4 08/21/2023   HCT 37.8 08/21/2023   MCV 84.3 08/21/2023   PLT 315.0 08/21/2023    BMET    Component Value Date/Time   NA 138 04/14/2024 1220   NA 136 08/02/2021 1629   NA 135 11/02/2014 0609   K 4.1 04/14/2024 1220   K 4.2 11/02/2014 0609   CL 101 04/14/2024 1220   CL 107 11/02/2014 0609   CO2 29 04/14/2024 1220   CO2 23 11/02/2014 0609   GLUCOSE 168 (H) 04/14/2024 1220   GLUCOSE 108 (H) 11/02/2014 0609   BUN 37 (H) 04/14/2024 1220   BUN 25 08/02/2021 1629   BUN 20 11/02/2014 0609   CREATININE 0.80 04/14/2024 1220   CREATININE 1.01 11/09/2015 1549   CREATININE 1.01 11/09/2015 1549   CALCIUM  9.1 04/14/2024 1220   CALCIUM  7.3 (L) 11/02/2014 0609   GFRNONAA >60 07/24/2023 0505   GFRNONAA 50 (L) 11/02/2014 0609   GFRAA >60 01/19/2019 0357   GFRAA 58 (L) 11/02/2014 0609   CrCl cannot be calculated (Patient's most recent lab result is older than the maximum  21 days allowed.).  COAG Lab Results  Component Value Date   INR 1.1 06/21/2023   INR 0.9 10/17/2014   INR 1.1 01/19/2014    Radiology No results found.   Assessment/Plan 1. Atherosclerosis of native artery of both lower extremities with intermittent claudication (Primary)  Recommend:  The patient has evidence of atherosclerosis of the lower extremities with claudication.  The patient does not voice lifestyle limiting changes at this  point in time.  Noninvasive studies do not suggest clinically significant change.  In fact, her ABIs appear improved bilaterally.  However, given the elevated velocities at the distal anastomosis of the left femoropopliteal I will continue surveillance at 27-month intervals and will obtain a duplex ultrasound of the left leg only with the next visit.  No invasive studies, angiography or surgery at this time The patient should continue walking and begin a more formal exercise program.  The patient should continue antiplatelet therapy and aggressive treatment of the lipid abnormalities  No changes in the patient's medications at this time  Continued surveillance is indicated as atherosclerosis is likely to progress with time.    The patient will continue follow up with noninvasive studies as ordered.  - VAS US  LOWER EXTREMITY ARTERIAL DUPLEX; Future - VAS US  ABI WITH/WO TBI; Future  2. Bilateral carotid artery stenosis Recommend:   Given the patient's asymptomatic subcritical stenosis no further invasive testing or surgery at this time.   Duplex ultrasound 12/25/2022 shows widely patent right carotid status post CEA and LICA 60-79% stenosis.   Continue antiplatelet therapy as prescribed Continue management of CAD, HTN and Hyperlipidemia Healthy heart diet,  encouraged exercise at least 4 times per week   Follow up in 12 months with duplex ultrasound and physical exam   3. Coronary artery disease of native artery of native heart with stable angina pectoris Continue cardiac and antihypertensive medications as already ordered and reviewed, no changes at this time.  Continue statin as ordered and reviewed, no changes at this time  Nitrates PRN for chest pain  4. Type II diabetes mellitus with peripheral circulatory disorder (HCC) Continue hypoglycemic medications as already ordered, these medications have been reviewed and there are no changes at this time.  Hgb A1C to be monitored as  already arranged by primary service  5. Primary hypertension Continue antihypertensive medications as already ordered, these medications have been reviewed and there are no changes at this time.    Cordella Shawl, MD  05/09/2024 8:57 AM

## 2024-05-09 NOTE — Telephone Encounter (Signed)
 LVM for pt to CB to schedule fu appt  fu 3 months + ABI + Left leg Arterial see GS/FB

## 2024-05-11 ENCOUNTER — Other Ambulatory Visit: Payer: Self-pay | Admitting: Internal Medicine

## 2024-05-13 ENCOUNTER — Ambulatory Visit (INDEPENDENT_AMBULATORY_CARE_PROVIDER_SITE_OTHER): Admitting: Internal Medicine

## 2024-05-13 ENCOUNTER — Encounter: Payer: Self-pay | Admitting: Internal Medicine

## 2024-05-13 VITALS — BP 136/78 | HR 92 | Temp 97.7°F | Ht 67.0 in | Wt 174.0 lb

## 2024-05-13 DIAGNOSIS — Z8551 Personal history of malignant neoplasm of bladder: Secondary | ICD-10-CM | POA: Diagnosis not present

## 2024-05-13 DIAGNOSIS — Z7985 Long-term (current) use of injectable non-insulin antidiabetic drugs: Secondary | ICD-10-CM

## 2024-05-13 DIAGNOSIS — Z794 Long term (current) use of insulin: Secondary | ICD-10-CM | POA: Diagnosis not present

## 2024-05-13 DIAGNOSIS — E1121 Type 2 diabetes mellitus with diabetic nephropathy: Secondary | ICD-10-CM

## 2024-05-13 DIAGNOSIS — E1151 Type 2 diabetes mellitus with diabetic peripheral angiopathy without gangrene: Secondary | ICD-10-CM

## 2024-05-13 DIAGNOSIS — E119 Type 2 diabetes mellitus without complications: Secondary | ICD-10-CM

## 2024-05-13 LAB — VAS US ABI WITH/WO TBI
Left ABI: 1.04
Right ABI: 1.08

## 2024-05-13 MED ORDER — OZEMPIC (0.25 OR 0.5 MG/DOSE) 2 MG/3ML ~~LOC~~ SOPN
0.2500 mg | PEN_INJECTOR | SUBCUTANEOUS | 2 refills | Status: AC
Start: 2024-05-13 — End: ?

## 2024-05-13 MED ORDER — GABAPENTIN 600 MG PO TABS: 600.0000 mg | ORAL_TABLET | Freq: Three times a day (TID) | ORAL | 0 refills | Status: DC

## 2024-05-13 MED ORDER — TELMISARTAN 20 MG PO TABS: 20.0000 mg | ORAL_TABLET | Freq: Every day | ORAL | 1 refills | Status: AC

## 2024-05-13 NOTE — Assessment & Plan Note (Addendum)
 Tolerating  telmisartan for blood pressure control.  .  She is s/p nephrectomy for renal cell carcinoma.  renal function is normal     Lab Results  Component Value Date   MICROALBUR 12.5 (H) 11/04/2023   Lab Results  Component Value Date   CREATININE 0.80 04/14/2024

## 2024-05-13 NOTE — Patient Instructions (Addendum)
 Continue taking 20 mg of  telmisartan daily . This is for your kidney protection.  The foamy urine is due to the diabetes  effect on the kidneys.  The telmisartan and jardiance  are helping this.    We are restarting ozempic    Start with 0.25 mg for the first 1-2 doses.  You may increase your dose  to 0.5 mg on Week 3 if you have not lost any weight   stop the mealtime insulin  and continue the tresiba  at night  28 units   Return in 2 weeks to review your sugars  Continue jardiance 

## 2024-05-13 NOTE — Assessment & Plan Note (Addendum)
 Improving control currently,  but she continues to struggle with increased appetite and  hs gained 40 lbs since stopping Ozempic . Today we are resuming Ozzempic.  No changes to insulin  regimen .  Follow up one month   Lab Results  Component Value Date   HGBA1C 7.6 (H) 05/13/2024   Lab Results  Component Value Date   LABMICR See below: 04/01/2024   LABMICR See below: 08/12/2022   MICROALBUR 12.5 (H) 11/04/2023

## 2024-05-13 NOTE — Progress Notes (Signed)
 Subjective:  Patient ID: Laura Reed, female    DOB: July 13, 1960  Age: 63 y.o. MRN: [id number]  CC: The primary encounter diagnosis was Insulin -requiring or dependent type II diabetes mellitus (HCC). Diagnoses of Diabetic nephropathy with proteinuria (HCC), Type II diabetes mellitus with peripheral circulatory disorder (HCC), Diabetes mellitus treated with injections of non-insulin  medication (HCC), and History of bladder cancer were also pertinent to this visit.   HPI Laura Reed presents for  Chief Complaint  Patient presents with   Medical Management of Chronic Issues    Type 2 DM:  seen one month ago for uncontrolled Type 2 DM,  Tresiba  dose was increased from 25 to 28 units and the pre dinner humalog   dose  to  8 units  I have downloaded and reviewed the data from patient's continuous blood glucose monitor  for  the period  of   Oct 25 to Nov 7  .  Patient's  sugars have been  IN RANGE  73   % OF THE TIME,   ABOVE RANGE    %27 of the time.  Average blood glucose is   154  with a variability of   27% . Patient reports compliance with medication approximately  95   % of the time.     She continues to struggle  with an increased appetite and has gained considerable weight since stopping ozempic   Hypertension: patient checks blood pressure twice weekly at home.  Readings have been for the most part <130/80 at rest . Patient is following a reduced salt diet most days and is taking medications as prescribed   She has notice foamy urine  Outpatient Medications Prior to Visit  Medication Sig Dispense Refill   acetaminophen  (TYLENOL ) 500 MG tablet Take 1,000 mg by mouth every 6 (six) hours as needed for mild pain or headache.      acidophilus (RISAQUAD) CAPS capsule Take 2 capsules by mouth 2 (two) times daily.     amoxicillin -clavulanate (AUGMENTIN ) 875-125 MG tablet Take 1 tablet by mouth 2 (two) times daily. 28 tablet 5   apixaban  (ELIQUIS ) 2.5 MG TABS tablet Take 1 tablet (2.5 mg  total) by mouth 2 (two) times daily. 60 tablet 11   aspirin  81 MG chewable tablet Chew 81 mg by mouth at bedtime.      atorvastatin  (LIPITOR) 20 MG tablet TAKE 1 TABLET(20 MG) BY MOUTH DAILY 90 tablet 1   Cholecalciferol  (VITAMIN D3) 2000 UNITS TABS Take 2,000 Units by mouth daily after supper.      clobetasol ointment (TEMOVATE) 0.05 % Apply topically.     co-enzyme Q-10 30 MG capsule Take 30 mg by mouth daily.     Continuous Glucose Sensor (FREESTYLE LIBRE 2 SENSOR) MISC Use to check sugar at least 4 times daily 2 each 2   empagliflozin  (JARDIANCE ) 25 MG TABS tablet TAKE 1 TABLET(25 MG) BY MOUTH DAILY BEFORE AND BREAKFAST 90 tablet 1   gabapentin  (NEURONTIN ) 300 MG capsule Take 1 capsule (300 mg total) by mouth 2 (two) times daily. 180 capsule 0   glucose blood test strip Use you check blood sugars up to four times daily. 100 each 12   insulin  degludec (TRESIBA  FLEXTOUCH) 100 UNIT/ML FlexTouch Pen Inject 15 Units into the skin daily. Reduced from 22 units daily.     insulin  degludec (TRESIBA  FLEXTOUCH) 100 UNIT/ML FlexTouch Pen Inject 25 Units into the skin daily. 15 mL 2   insulin  lispro (HUMALOG  KWIKPEN) 100 UNIT/ML KwikPen ADMINISTER 5-10  UNITS UNDER THE SKIN THREE TIMES DAILY if you eat a meal.  5 units if blood sugar <200, 10 units if blood sugar >200. 15 mL 1   Insulin  Pen Needle (PEN NEEDLES) 32G X 4 MM MISC Use to take insulin  daily 100 each 3   mometasone  (ELOCON ) 0.1 % cream APPLY TO THE AFFECTED AREA TWICE DAILY FOR EAR CANAL AS NEEDED FOR ITCHING 15 g 1   Multiple Vitamin (MULTIVITAMIN) tablet Take 1 tablet by mouth daily.     polycarbophil (FIBERCON) 625 MG tablet Take 1 tablet (625 mg total) by mouth 2 (two) times daily. For stool bulking. 60 tablet 0   triamcinolone  ointment (KENALOG ) 0.5 % APPLY TOPICALLY TO THE AFFECTED AREA TWICE DAILY 75 g 2   gabapentin  (NEURONTIN ) 600 MG tablet Take 1 tablet (600 mg total) by mouth 3 (three) times daily. 90 tablet 0   telmisartan (MICARDIS)  20 MG tablet Take 1 tablet (20 mg total) by mouth daily. 30 tablet 0   No facility-administered medications prior to visit.    Review of Systems;  Patient denies headache, fevers, malaise, unintentional weight loss, skin rash, eye pain, sinus congestion and sinus pain, sore throat, dysphagia,  hemoptysis , cough, dyspnea, wheezing, chest pain, palpitations, orthopnea, edema, abdominal pain, nausea, melena, diarrhea, constipation, flank pain, dysuria, hematuria, urinary  Frequency, nocturia, numbness, tingling, seizures,  Focal weakness, Loss of consciousness,  Tremor, insomnia, depression, anxiety, and suicidal ideation.      Objective:  BP 136/78   Pulse 92   Temp 97.7 F (36.5 C) (Oral)   Ht 5' 7 (1.702 m)   Wt 174 lb (78.9 kg)   SpO2 98%   BMI 27.25 kg/m   BP Readings from Last 3 Encounters:  05/13/24 136/78  05/09/24 (!) 142/63  04/14/24 (!) 142/66    Wt Readings from Last 3 Encounters:  05/13/24 174 lb (78.9 kg)  05/09/24 177 lb 3.2 oz (80.4 kg)  04/14/24 168 lb (76.2 kg)    Physical Exam Vitals reviewed.  Constitutional:      General: She is not in acute distress.    Appearance: Normal appearance. She is normal weight. She is not ill-appearing, toxic-appearing or diaphoretic.  HENT:     Head: Normocephalic.  Eyes:     General: No scleral icterus.       Right eye: No discharge.        Left eye: No discharge.     Conjunctiva/sclera: Conjunctivae normal.  Cardiovascular:     Rate and Rhythm: Normal rate and regular rhythm.     Heart sounds: Normal heart sounds.  Pulmonary:     Effort: Pulmonary effort is normal. No respiratory distress.     Breath sounds: Normal breath sounds.  Musculoskeletal:        General: Normal range of motion.  Skin:    General: Skin is warm and dry.  Neurological:     General: No focal deficit present.     Mental Status: She is alert and oriented to person, place, and time. Mental status is at baseline.  Psychiatric:        Mood  and Affect: Mood normal.        Behavior: Behavior normal.        Thought Content: Thought content normal.        Judgment: Judgment normal.     Lab Results  Component Value Date   HGBA1C 7.6 (H) 05/13/2024   HGBA1C 8.0 (H) 02/08/2024   HGBA1C 8.6 (  H) 11/04/2023    Lab Results  Component Value Date   CREATININE 0.99 05/13/2024   CREATININE 0.80 04/14/2024   CREATININE 0.76 02/08/2024    Lab Results  Component Value Date   WBC 9.6 08/21/2023   HGB 12.4 08/21/2023   HCT 37.8 08/21/2023   PLT 315.0 08/21/2023   GLUCOSE 151 (H) 05/13/2024   CHOL 142 02/08/2024   TRIG 72.0 02/08/2024   HDL 67.80 02/08/2024   LDLDIRECT 57.0 02/08/2024   LDLCALC 59 02/08/2024   ALT 17 05/13/2024   AST 15 05/13/2024   NA 137 05/13/2024   K 4.8 05/13/2024   CL 99 05/13/2024   CREATININE 0.99 05/13/2024   BUN 29 (H) 05/13/2024   CO2 22 05/13/2024   TSH 0.864 06/22/2023   INR 1.1 06/21/2023   HGBA1C 7.6 (H) 05/13/2024   MICROALBUR 12.5 (H) 11/04/2023    CT HEMATURIA WORKUP Result Date: 03/19/2024 CLINICAL DATA:  History of bladder cancer and urothelial cancer, status post left nephrectomy * Tracking Code: BO * EXAM: CT ABDOMEN AND PELVIS WITHOUT AND WITH CONTRAST TECHNIQUE: Multidetector CT imaging of the abdomen and pelvis was performed following the standard protocol before and following the bolus administration of intravenous contrast. RADIATION DOSE REDUCTION: This exam was performed according to the departmental dose-optimization program which includes automated exposure control, adjustment of the mA and/or kV according to patient size and/or use of iterative reconstruction technique. CONTRAST:  OMNIPAQUE  IOHEXOL  300 MG/ML  SOLN COMPARISON:  06/22/2023 FINDINGS: Lower chest: No acute abnormality.  Coronary artery calcifications. Hepatobiliary: No focal liver abnormality is seen. Status post cholecystectomy. No biliary dilatation. Pancreas: Severely atrophic, calcified, cystic pancreas.  No acute inflammatory findings. Spleen: Normal in size without significant abnormality. Adrenals/Urinary Tract: Unchanged benign left adrenal adenoma (series 9, image 19). Status post left nephrectomy. No suspicious soft tissue or contrast enhancement in the nephrectomy bed. Numerous tiny nonobstructive right renal calculi or renal vascular calcifications. Mild right hydronephrosis and hydroureter to the ureterovesicular junction without calculus or other visible obstruction (series 9, image 33). No urinary tract filling defect on delayed phase imaging. Bladder is unremarkable. Stomach/Bowel: Stomach is within normal limits. Appendix appears normal. No evidence of bowel wall thickening, distention, or inflammatory changes. Vascular/Lymphatic: Severe, pipe like aortic atherosclerosis and extensive vascular calcinosis. Bilateral common and external iliac artery stents. Femoral femoral bypass grafting. Bilateral femoropopliteal bypass graft, incompletely imaged. No enlarged abdominal or pelvic lymph nodes. Reproductive: No mass or other significant abnormality. Other: No abdominal wall hernia or abnormality. No ascites. Unchanged presacral cystic lesion, stable over numerous prior examinations and consistent with a benign tail gut duplication cysts requiring no specific further follow-up or characterization (series 9, image 61). Musculoskeletal: No acute or significant osseous findings. IMPRESSION: 1. Status post left nephrectomy. No suspicious soft tissue or contrast enhancement in the nephrectomy bed. 2. No evidence of lymphadenopathy or metastatic disease in the abdomen or pelvis. 3. Mild right hydronephrosis and hydroureter to the ureterovesicular junction without calculus or other visible obstruction. This may reflect sequelae of recently passed calculus but is otherwise of uncertain significance. 4. Numerous tiny nonobstructive right renal calculi or renal vascular calcifications. 5. Severely atrophic, calcified,  cystic pancreas, consistent with chronic sequelae of pancreatitis. 6. Coronary artery disease. 7. Severe atherosclerosis. Aortic Atherosclerosis (ICD10-I70.0). Electronically Signed   By: Marolyn JONETTA Jaksch M.D.   On: 03/19/2024 14:20    Assessment & Plan:  .Insulin -requiring or dependent type II diabetes mellitus (HCC) Assessment & Plan: Improving control currently,  but  she continues to struggle with increased appetite and  hs gained 40 lbs since stopping Ozempic . Today we are resuming Ozzempic.  No changes to insulin  regimen .  Follow up one month   Lab Results  Component Value Date   HGBA1C 7.6 (H) 05/13/2024   Lab Results  Component Value Date   LABMICR See below: 04/01/2024   LABMICR See below: 08/12/2022   MICROALBUR 12.5 (H) 11/04/2023       Orders: -     Hemoglobin A1c -     Comprehensive metabolic panel with GFR -     Hemoglobin A1c; Future -     Comprehensive metabolic panel with GFR; Future  Diabetic nephropathy with proteinuria [facility]) Assessment & Plan: Tolerating  telmisartan for blood pressure control.  .  She is s/p nephrectomy for renal cell carcinoma.  renal function is normal     Lab Results  Component Value Date   MICROALBUR 12.5 (H) 11/04/2023   Lab Results  Component Value Date   CREATININE 0.80 04/14/2024     Orders: -     Microalbumin / creatinine urine ratio; Future  Type II diabetes mellitus with peripheral circulatory disorder (HCC) Assessment & Plan: Conplicated by neuropathy. 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 Her  acquired deformities of both feet require use of orthotics to prevent pressure ulcers on the remaining feet.    Diabetes mellitus treated with injections of non-insulin  medication (HCC) Assessment & Plan: Resuming ozempic  for unintenational weight gain and improved glycemic control    History of bladder cancer Assessment & Plan: Recent  cystoscopy  with washings was negative for malignant cells    Other orders -     Gabapentin ; Take 1 tablet (600 mg total) by mouth 3 (three) times daily.  Dispense: 90 tablet; Refill: 0 -     Telmisartan; Take 1 tablet (20 mg total) by mouth daily.  Dispense: 90 tablet; Refill: 1 -     Ozempic  (0.25 or 0.5 MG/DOSE); Inject 0.25 mg into the skin once a week.  Dispense: 3 mL; Refill: 2    In addition to time spent reviewing data from her CBG monitor , I  personally spent a total of 30 minutes in the care of the patient today including getting/reviewing separately obtained history, performing a medically appropriate exam/evaluation, counseling and educating, documenting clinical information in the EHR, independently interpreting results, and communicating results.    Follow-up: Return in about 2 weeks (around 05/27/2024) for follow up diabetes.   Verneita LITTIE Kettering, MD

## 2024-05-14 LAB — COMPREHENSIVE METABOLIC PANEL WITH GFR
ALT: 17 IU/L (ref 0–32)
AST: 15 IU/L (ref 0–40)
Albumin: 4.2 g/dL (ref 3.9–4.9)
Alkaline Phosphatase: 94 IU/L (ref 49–135)
BUN/Creatinine Ratio: 29 — ABNORMAL HIGH (ref 12–28)
BUN: 29 mg/dL — ABNORMAL HIGH (ref 8–27)
Bilirubin Total: 0.3 mg/dL (ref 0.0–1.2)
CO2: 22 mmol/L (ref 20–29)
Calcium: 9.3 mg/dL (ref 8.7–10.3)
Chloride: 99 mmol/L (ref 96–106)
Creatinine, Ser: 0.99 mg/dL (ref 0.57–1.00)
Globulin, Total: 2.7 g/dL (ref 1.5–4.5)
Glucose: 151 mg/dL — ABNORMAL HIGH (ref 70–99)
Potassium: 4.8 mmol/L (ref 3.5–5.2)
Sodium: 137 mmol/L (ref 134–144)
Total Protein: 6.9 g/dL (ref 6.0–8.5)
eGFR: 63 mL/min/1.73 (ref >59)

## 2024-05-14 LAB — HEMOGLOBIN A1C
Est. average glucose Bld gHb Est-mCnc: 171 mg/dL
Hgb A1c MFr Bld: 7.6 % — ABNORMAL HIGH (ref 4.8–5.6)

## 2024-05-15 ENCOUNTER — Ambulatory Visit: Payer: Self-pay | Admitting: Internal Medicine

## 2024-05-15 DIAGNOSIS — Z7985 Long-term (current) use of injectable non-insulin antidiabetic drugs: Secondary | ICD-10-CM | POA: Insufficient documentation

## 2024-05-15 NOTE — Assessment & Plan Note (Signed)
 Conplicated by neuropathy. 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 Her  acquired deformities of both feet require use of orthotics to prevent pressure ulcers on the remaining feet.

## 2024-05-15 NOTE — Assessment & Plan Note (Signed)
 Recent cystoscopy  with washings was negative for malignant cells

## 2024-05-15 NOTE — Assessment & Plan Note (Signed)
 Resuming ozempic  for unintenational weight gain and improved glycemic control

## 2024-05-16 NOTE — Telephone Encounter (Signed)
 Pt was seen in office on 05/13/2024 and asked then.

## 2024-05-24 ENCOUNTER — Encounter: Payer: Self-pay | Admitting: Internal Medicine

## 2024-05-30 ENCOUNTER — Ambulatory Visit: Admitting: Internal Medicine

## 2024-05-31 ENCOUNTER — Telehealth: Payer: Self-pay

## 2024-05-31 NOTE — Telephone Encounter (Signed)
 Copied from CRM #[id number]. Topic: Clinical - Prescription Issue >> May 31, 2024 11:01 AM Anairis L wrote: Reason for CRM: Sharise From [facility] calling to inform us  that Insulin  degludec (TRESIBA  FLEXTOUCH) 100 UNIT/ML FlexTouch Pen will no longer apart of formularies as 07/07/2024   Alternative-Toujeo  and Lantus 

## 2024-06-01 ENCOUNTER — Other Ambulatory Visit: Payer: Self-pay | Admitting: Internal Medicine

## 2024-06-01 DIAGNOSIS — E1165 Type 2 diabetes mellitus with hyperglycemia: Secondary | ICD-10-CM

## 2024-06-01 MED ORDER — TRESIBA FLEXTOUCH 100 UNIT/ML ~~LOC~~ SOPN: 25.0000 [IU] | PEN_INJECTOR | Freq: Every day | SUBCUTANEOUS | 0 refills | Status: AC

## 2024-06-09 ENCOUNTER — Other Ambulatory Visit (INDEPENDENT_AMBULATORY_CARE_PROVIDER_SITE_OTHER): Payer: Self-pay | Admitting: Vascular Surgery

## 2024-06-13 ENCOUNTER — Encounter

## 2024-06-13 ENCOUNTER — Other Ambulatory Visit

## 2024-06-16 ENCOUNTER — Other Ambulatory Visit (INDEPENDENT_AMBULATORY_CARE_PROVIDER_SITE_OTHER): Payer: Self-pay | Admitting: Vascular Surgery

## 2024-06-20 ENCOUNTER — Ambulatory Visit: Payer: 59

## 2024-06-20 VITALS — BP 134/75 | HR 69 | Ht 67.0 in | Wt 171.0 lb

## 2024-06-20 DIAGNOSIS — Z0000 Encounter for general adult medical examination without abnormal findings: Secondary | ICD-10-CM | POA: Diagnosis not present

## 2024-06-20 NOTE — Patient Instructions (Signed)
 Laura Reed,  Thank you for taking the time for your Medicare Wellness Visit. I appreciate your continued commitment to your health goals. Please review the care plan we discussed, and feel free to reach out if I can assist you further.  Please note that Annual Wellness Visits do not include a physical exam. Some assessments may be limited, especially if the visit was conducted virtually. If needed, we may recommend an in-person follow-up with your provider.  Ongoing Care Seeing your primary care provider every 3 to 6 months helps us  monitor your health and provide consistent, personalized care.  Remember to call and reschedule your lung cancer screening and get your second shingles vaccine.  Referrals If a referral was made during today's visit and you haven't received any updates within two weeks, please contact the referred provider directly to check on the status.  Recommended Screenings:  Health Maintenance  Topic Date Due   Screening for Lung Cancer  Never done   Zoster (Shingles) Vaccine (2 of 2) 06/03/2024   Pneumococcal Vaccine for age over 66 (3 of 3 - PCV20 or PCV21) 05/13/2025*   COVID-19 Vaccine (9 - Moderna risk 2025-26 season) 10/07/2024   Yearly kidney health urinalysis for diabetes  11/03/2024   Complete foot exam   11/03/2024   Hemoglobin A1C  11/10/2024   Eye exam for diabetics  01/10/2025   Yearly kidney function blood test for diabetes  05/13/2025   Medicare Annual Wellness Visit  06/20/2025   Breast Cancer Screening  11/26/2025   Pap with HPV screening  01/27/2028   Colon Cancer Screening  04/02/2032   DTaP/Tdap/Td vaccine (3 - Td or Tdap) 05/02/2034   Flu Shot  Completed   Osteoporosis screening with Bone Density Scan  Completed   Hepatitis C Screening  Completed   HIV Screening  Completed   Hepatitis B Vaccine  Aged Out   Meningitis B Vaccine  Aged Out  *Topic was postponed. The date shown is not the original due date.       06/20/2024    9:39 AM   Advanced Directives  Does Patient Have a Medical Advance Directive? Yes  Type of Estate Agent of [place];Living will  Does patient want to make changes to medical advance directive? No - Patient declined  Copy of Healthcare Power of Attorney in Chart? Yes - validated most recent copy scanned in chart (See row information)    Vision: Annual vision screenings are recommended for early detection of glaucoma, cataracts, and diabetic retinopathy. These exams can also reveal signs of chronic conditions such as diabetes and high blood pressure.  Dental: Annual dental screenings help detect early signs of oral cancer, gum disease, and other conditions linked to overall health, including heart disease and diabetes.  Please see the attached documents for additional preventive care recommendations.

## 2024-06-20 NOTE — Progress Notes (Signed)
 Chief Complaint  Patient presents with   Medicare Wellness     Subjective:   Laura Reed is a 63 y.o. female who presents for a Medicare Annual Wellness Visit.  Visit info / Clinical Intake: Medicare Wellness Visit Type:: Subsequent Annual Wellness Visit Persons participating in visit and providing information:: patient Medicare Wellness Visit Mode:: Telephone If telephone:: video declined Since this visit was completed virtually, some vitals may be partially provided or unavailable. Missing vitals are due to the limitations of the virtual format.: Documented vitals are patient reported If Telephone or Video please confirm:: I connected with patient using audio/video enable telemedicine. I verified patient identity with two identifiers, discussed telehealth limitations, and patient agreed to proceed. Patient Location:: Home Provider Location:: Office/home Interpreter Needed?: No Pre-visit prep was completed: yes AWV questionnaire completed by patient prior to visit?: no Living arrangements:: (!) lives alone Patient's Overall Health Status Rating: good Typical amount of pain: some Does pain affect daily life?: no Are you currently prescribed opioids?: no  Dietary Habits and Nutritional Risks How many meals a day?: 2 Eats fruit and vegetables daily?: yes Most meals are obtained by: preparing own meals In the last 2 weeks, have you had any of the following?: none Diabetic:: (!) yes Any non-healing wounds?: no How often do you check your BS?: continuous glucose monitor Would you like to be referred to a Nutritionist or for Diabetic Management? : no  Functional Status Activities of Daily Living (to include ambulation/medication): Independent Ambulation: Independent Medication Administration: Independent Home Management (perform basic housework or laundry): Independent Manage your own finances?: yes Primary transportation is: driving Concerns about vision?: no *vision  screening is required for WTM* Concerns about hearing?: no  Fall Screening Falls in the past year?: 1 Number of falls in past year: 0 Was there an injury with Fall?: 1 (did not require medical attention) Fall Risk Category Calculator: 2 Patient Fall Risk Level: Moderate Fall Risk  Fall Risk Patient at Risk for Falls Due to: History of fall(s); Impaired balance/gait (tripped) Fall risk Follow up: Falls evaluation completed; Falls prevention discussed  Home and Transportation Safety: All rugs have non-skid backing?: yes All stairs or steps have railings?: yes Grab bars in the bathtub or shower?: (!) no Have non-skid surface in bathtub or shower?: yes Good home lighting?: yes Regular seat belt use?: yes Hospital stays in the last year:: (!) yes How many hospital stays:: 2 Reason: blood clot and toe amputation, infection in groin  Cognitive Assessment Difficulty concentrating, remembering, or making decisions? : no Will 6CIT or Mini Cog be Completed: yes What year is it?: 0 points What month is it?: 0 points Give patient an address phrase to remember (5 components): [address] [place] About what time is it?: 0 points Count backwards from 20 to 1: 0 points Say the months of the year in reverse: 0 points Repeat the address phrase from earlier: 0 points 6 CIT Score: 0 points  Advance Directives (For Healthcare) Does Patient Have a Medical Advance Directive?: Yes Does patient want to make changes to medical advance directive?: No - Patient declined Type of Advance Directive: Healthcare Power of [place]; Living will Copy of Healthcare Power of Attorney in Chart?: Yes - validated most recent copy scanned in chart (See row information) Copy of Living Will in Chart?: Yes - validated most recent copy scanned in chart (See row information)  Reviewed/Updated  Reviewed/Updated: Reviewed All (Medical, Surgical, Family, Medications, Allergies, Care Teams, Patient  Goals)  Allergies (verified) Patient has no known allergies.   Current Medications (verified) Outpatient Encounter Medications as of 06/20/2024  Medication Sig   acetaminophen  (TYLENOL ) 500 MG tablet Take 1,000 mg by mouth every 6 (six) hours as needed for mild pain or headache.    acidophilus (RISAQUAD) CAPS capsule Take 2 capsules by mouth 2 (two) times daily.   amoxicillin -clavulanate (AUGMENTIN ) 875-125 MG tablet TAKE 1 TABLET BY MOUTH TWICE DAILY   aspirin  81 MG chewable tablet Chew 81 mg by mouth at bedtime.    atorvastatin  (LIPITOR) 20 MG tablet TAKE 1 TABLET(20 MG) BY MOUTH DAILY   Cholecalciferol  (VITAMIN D3) 2000 UNITS TABS Take 2,000 Units by mouth daily after supper.    clobetasol ointment (TEMOVATE) 0.05 % Apply topically.   co-enzyme Q-10 30 MG capsule Take 30 mg by mouth daily.   Continuous Glucose Sensor (FREESTYLE LIBRE 2 SENSOR) MISC Use to check sugar at least 4 times daily   ELIQUIS  2.5 MG TABS tablet TAKE 1 TABLET(2.5 MG) BY MOUTH TWICE DAILY   empagliflozin  (JARDIANCE ) 25 MG TABS tablet TAKE 1 TABLET(25 MG) BY MOUTH DAILY BEFORE AND BREAKFAST   gabapentin  (NEURONTIN ) 600 MG tablet Take 1 tablet (600 mg total) by mouth 3 (three) times daily.   glucose blood test strip Use you check blood sugars up to four times daily.   insulin  degludec (TRESIBA  FLEXTOUCH) 100 UNIT/ML FlexTouch Pen Inject 15 Units into the skin daily. Reduced from 22 units daily. (Patient taking differently: Inject 15 Units into the skin daily. Reduced from 28units daily.)   insulin  degludec (TRESIBA  FLEXTOUCH) 100 UNIT/ML FlexTouch Pen Inject 25 Units into the skin daily. (Patient taking differently: Inject 28 Units into the skin daily.)   Insulin  Pen Needle (PEN NEEDLES) 32G X 4 MM MISC Use to take insulin  daily   mometasone  (ELOCON ) 0.1 % cream APPLY TO THE AFFECTED AREA TWICE DAILY FOR EAR CANAL AS NEEDED FOR ITCHING   Multiple Vitamin (MULTIVITAMIN) tablet Take 1 tablet by mouth daily.    polycarbophil (FIBERCON) 625 MG tablet Take 1 tablet (625 mg total) by mouth 2 (two) times daily. For stool bulking.   Semaglutide ,0.25 or 0.5MG /DOS, (OZEMPIC , 0.25 OR 0.5 MG/DOSE,) 2 MG/3ML SOPN Inject 0.25 mg into the skin once a week. (Patient taking differently: Inject 0.5 mg into the skin once a week.)   telmisartan  (MICARDIS ) 20 MG tablet Take 1 tablet (20 mg total) by mouth daily.   triamcinolone  ointment (KENALOG ) 0.5 % APPLY TOPICALLY TO THE AFFECTED AREA TWICE DAILY   gabapentin  (NEURONTIN ) 300 MG capsule Take 1 capsule (300 mg total) by mouth 2 (two) times daily. (Patient not taking: Reported on 06/20/2024)   insulin  lispro (HUMALOG  KWIKPEN) 100 UNIT/ML KwikPen ADMINISTER 5-10 UNITS UNDER THE SKIN THREE TIMES DAILY if you eat a meal.  5 units if blood sugar <200, 10 units if blood sugar >200. (Patient not taking: Reported on 06/20/2024)   No facility-administered encounter medications on file as of 06/20/2024.    History: Past Medical History:  Diagnosis Date   Abscess of groin, left 06/26/2023   Absence of kidney    left   Anxiety    Arthritis    Atherosclerosis of native arteries of extremities with intermittent claudication, bilateral legs    Bladder cancer (HCC)    Cataract    CHF (congestive heart failure) (HCC)    Complication of anesthesia    BP HAS  RUN LOW AFTER SURGERY-LUNGS FILLED UP WITH FLUID AFTER  LEG STENT SURGERY  Coronary artery disease    Diabetes mellitus    Family history of adverse reaction to anesthesia    Sister - PONV   GERD (gastroesophageal reflux disease)    OCC TUMS   Heart murmur    Hemorrhoid    History of methicillin resistant staphylococcus aureus (MRSA) 2007   Hypertension    Neuromuscular disorder (HCC)    Neuropathy    PVD (peripheral vascular disease)    Thyroid  nodule    right   Unspecified osteoarthritis, unspecified site    Urothelial carcinoma of kidney (HCC) 10/31/2014   INVASIVE UROTHELIAL CARCINOMA, LOW GRADE. T1, Nx.    Vitamin D  deficiency, unspecified    Wears dentures    full upper and lower   Past Surgical History:  Procedure Laterality Date   AMPUTATION TOE     right (4th and 5th); left (great toe, 3rd)   AMPUTATION TOE Right 07/16/2018   Procedure: AMPUTATION TOE/MPJ right 2nd;  Surgeon: Neill Boas, DPM;  Location: [facility];  Service: Podiatry;  Laterality: Right;   APPLICATION OF WOUND VAC Left 06/26/2023   Procedure: WOUND VAC EXCHANGE LEFT GROIN;  Surgeon: Jama Cordella MATSU, MD;  Location: [facility];  Service: Vascular;  Laterality: Left;   APPLICATION OF WOUND VAC Left 06/29/2023   Procedure: WOUND VAC EXCHANGE LEFT GROIN;  Surgeon: Jama Cordella MATSU, MD;  Location: [facility];  Service: Vascular;  Laterality: Left;   APPLICATION OF WOUND VAC Left 07/02/2023   Procedure: WOUND VAC EXCHANGE LEFT GROIN;  Surgeon: Marea Selinda RAMAN, MD;  Location: [facility];  Service: Vascular;  Laterality: Left;   APPLICATION OF WOUND VAC Left 07/06/2023   Procedure: WOUND VAC EXCHANGE LEFT GROIN;  Surgeon: Jama Cordella MATSU, MD;  Location: [facility];  Service: Vascular;  Laterality: Left;   APPLICATION OF WOUND VAC Left 07/16/2023   Procedure: WOUND VAC EXCHANGE LEFT GROIN;  Surgeon: Jama Cordella MATSU, MD;  Location: [facility];  Service: Vascular;  Laterality: Left;   APPLICATION OF WOUND VAC Left 07/23/2023   Procedure: WOUND VAC EXCHANGE;  Surgeon: Jama Cordella MATSU, MD;  Location: [facility];  Service: Vascular;  Laterality: Left;   APPLICATION OF WOUND VAC Left 07/13/2023   Procedure: WOUND VAC EXCHANGE LEFT GROIN;  Surgeon: Jama Cordella MATSU, MD;  Location: [facility];  Service: Vascular;  Laterality: Left;   APPLICATION OF WOUND VAC Left 07/09/2023   Procedure: WOUND VAC EXCHANGE OF LEFT GROIN;  Surgeon: Marea Selinda RAMAN, MD;  Location: [facility];  Service: Vascular;  Laterality: Left;   APPLICATION OF WOUND VAC Left 07/20/2023   Procedure: WOUND VAC EXCHANGE;  Surgeon: Jama Cordella MATSU, MD;  Location: [facility];   Service: Vascular;  Laterality: Left;   ARTERIAL BYPASS SURGRY  2009, 2013 x 2   right leg , done in [place]   CARDIAC CATHETERIZATION     CAROTID ENDARTERECTOMY Right 01/2014   Dr Jama   CATARACT EXTRACTION W/PHACO Right 12/14/2014   Procedure: CATARACT EXTRACTION PHACO AND INTRAOCULAR LENS PLACEMENT (IOC);  Surgeon: Newell Ovens, MD;  Location: [facility];  Service: Ophthalmology;  Laterality: Right;  US    00:38.6              AP        7.1                   CDE  2.76   CATARACT EXTRACTION W/PHACO Left 12/06/2019   Procedure: CATARACT EXTRACTION PHACO AND INTRAOCULAR LENS PLACEMENT (IOC) LEFT DIABETIC;  Surgeon: Jaye Fallow, MD;  Location: [facility];  Service: Ophthalmology;  Laterality: Left;  9.08 1:06.4   CESAREAN SECTION     CHOLECYSTECTOMY  03/03/2012   Porcelain gallbladder, gallstones,  [place]   COLONOSCOPY W/ BIOPSIES  04/28/2012   Hyperplastic rectal polyps.   COLONOSCOPY WITH PROPOFOL  N/A 04/02/2022   Procedure: COLONOSCOPY WITH PROPOFOL ;  Surgeon: Dessa Reyes ORN, MD;  Location: [facility];  Service: Endoscopy;  Laterality: N/A;   CORONARY ARTERY BYPASS GRAFT  2009   3 vessel   CYSTOSCOPY W/ RETROGRADES Right 09/01/2016   Procedure: CYSTOSCOPY WITH RETROGRADE PYELOGRAM;  Surgeon: Rosina Riis, MD;  Location: [facility];  Service: Urology;  Laterality: Right;   CYSTOSCOPY W/ RETROGRADES Bilateral 03/19/2020   Procedure: CYSTOSCOPY WITH RETROGRADE PYELOGRAM;  Surgeon: Riis Rosina, MD;  Location: [facility];  Service: Urology;  Laterality: Bilateral;   CYSTOSCOPY WITH BIOPSY N/A 03/19/2020   Procedure: CYSTOSCOPY WITH BIOPSY;  Surgeon: Riis Rosina, MD;  Location: [facility];  Service: Urology;  Laterality: N/A;   EYE SURGERY     FEMORAL-POPLITEAL BYPASS GRAFT Left 04/29/2023   Procedure: BYPASS GRAFT FEMORAL-POPLITEAL ARTERY;  Surgeon: Jama Cordella MATSU, MD;  Location: [facility];  Service: Vascular;  Laterality: Left;   HERNIA REPAIR  10/31/2014    ventral, retro-rectus atrium mesh   INCISION AND DRAINAGE ABSCESS Left 06/24/2023   Procedure: INCISION AND DRAINAGE WITH SATORIUS FLAP;  Surgeon: Jama Cordella MATSU, MD;  Location: [facility];  Service: Vascular;  Laterality: Left;   IRRIGATION AND DEBRIDEMENT FOOT Left 01/18/2019   Procedure: IRRIGATION AND DEBRIDEMENT FOOT;  Surgeon: Neill Boas, DPM;  Location: [facility];  Service: Podiatry;  Laterality: Left;   LOWER EXTREMITY ANGIOGRAPHY Left 12/10/2016   Procedure: Lower Extremity Angiography;  Surgeon: Jama Cordella MATSU, MD;  Location: [facility];  Service: Cardiovascular;  Laterality: Left;   LOWER EXTREMITY ANGIOGRAPHY Left 02/02/2018   Procedure: LOWER EXTREMITY ANGIOGRAPHY;  Surgeon: Jama Cordella MATSU, MD;  Location: [facility];  Service: Cardiovascular;  Laterality: Left;   LOWER EXTREMITY ANGIOGRAPHY Left 05/05/2018   Procedure: LOWER EXTREMITY ANGIOGRAPHY;  Surgeon: Jama Cordella MATSU, MD;  Location: [facility];  Service: Cardiovascular;  Laterality: Left;   LOWER EXTREMITY ANGIOGRAPHY Left 12/04/2020   Procedure: LOWER EXTREMITY ANGIOGRAPHY with Intervention;  Surgeon: Jama Cordella MATSU, MD;  Location: [facility];  Service: Cardiovascular;  Laterality: Left;   LOWER EXTREMITY ANGIOGRAPHY Left 04/24/2023   Procedure: Lower Extremity Angiography;  Surgeon: Jama Cordella MATSU, MD;  Location: [facility];  Service: Cardiovascular;  Laterality: Left;   LOWER EXTREMITY ANGIOGRAPHY Left 05/01/2023   Procedure: Lower Extremity Angiography;  Surgeon: Jama Cordella MATSU, MD;  Location: [facility];  Service: Cardiovascular;  Laterality: Left;   NEPHRECTOMY Left 10/31/2014   PERIPHERAL VASCULAR CATHETERIZATION Left 05/01/2015   Procedure: Lower Extremity Angiography;  Surgeon: Cordella MATSU Jama, MD;  Location: [facility];  Service: Cardiovascular;  Laterality: Left;   PERIPHERAL VASCULAR CATHETERIZATION  05/01/2015    Procedure: Lower Extremity Intervention;  Surgeon: Cordella MATSU Jama, MD;  Location: [facility];  Service: Cardiovascular;;   PERIPHERAL VASCULAR CATHETERIZATION Left 02/20/2015   Procedure: Pelvic Angiography;  Surgeon: Cordella MATSU Jama, MD;  Location: [facility];  Service: Cardiovascular;  Laterality: Left;   TRANSMETATARSAL AMPUTATION Left 05/05/2023   Procedure: TRANSMETATARSAL AMPUTATION LEFT;  Surgeon: Ashley Soulier, DPM;  Location: [facility];  Service: Orthopedics/Podiatry;  Laterality: Left;   TRANSURETHRAL  RESECTION OF BLADDER TUMOR WITH MITOMYCIN -C N/A 09/01/2016   Procedure: TRANSURETHRAL RESECTION OF BLADDER TUMOR WITH MITOMYCIN -C;  Surgeon: Rosina Riis, MD;  Location: [facility];  Service: Urology;  Laterality: N/A;   TUBAL LIGATION  1992   Family History  Problem Relation Age of Onset   Cancer Mother        Lung Cancer   Cancer Father        Lung Ca   Diabetes Son    Breast cancer Maternal Grandmother    Kidney cancer Neg Hx    Bladder Cancer Neg Hx    Prostate cancer Neg Hx    Social History   Occupational History   Occupation: Disabled  Tobacco Use   Smoking status: Former    Current packs/day: 0.00    Average packs/day: 2.0 packs/day for 35.0 years (70.0 ttl pk-yrs)    Types: Cigarettes    Start date: 03/29/1978    Quit date: 03/29/2013    Years since quitting: 11.2   Smokeless tobacco: Never   Tobacco comments:    Quit 04/2023  Vaping Use   Vaping status: Former  Substance and Sexual Activity   Alcohol use: Not Currently    Alcohol/week: 0.0 standard drinks of alcohol    Comment: LAST DRINK 2009   Drug use: Not Currently    Types: Cocaine    Comment: last used in 2007   Sexual activity: Not Currently   Tobacco Counseling Counseling given: Not Answered Tobacco comments: Quit 04/2023  SDOH Screenings   Food Insecurity: No Food Insecurity (06/20/2024)  Housing: Low Risk (06/20/2024)  Transportation Needs: No Transportation  Needs (06/20/2024)  Utilities: Not At Risk (06/20/2024)  Alcohol Screen: Low Risk (06/20/2024)  Depression (PHQ2-9): Low Risk (06/20/2024)  Financial Resource Strain: Low Risk (06/20/2024)  Physical Activity: Sufficiently Active (06/20/2024)  Social Connections: Socially Isolated (06/20/2024)  Stress: No Stress Concern Present (06/20/2024)  Tobacco Use: Medium Risk (06/20/2024)  Health Literacy: Inadequate Health Literacy (06/17/2023)   See flowsheets for full screening details  Depression Screen PHQ 2 & 9 Depression Scale- Over the past 2 weeks, how often have you been bothered by any of the following problems? Little interest or pleasure in doing things: 0 Feeling down, depressed, or hopeless (PHQ Adolescent also includes...irritable): 0 PHQ-2 Total Score: 0 Trouble falling or staying asleep, or sleeping too much: 0 Feeling tired or having little energy: 0 Poor appetite or overeating (PHQ Adolescent also includes...weight loss): 1 Feeling bad about yourself - or that you are a failure or have let yourself or your family down: 0 Trouble concentrating on things, such as reading the newspaper or watching television (PHQ Adolescent also includes...like school work): 0 Moving or speaking so slowly that other people could have noticed. Or the opposite - being so fidgety or restless that you have been moving around a lot more than usual: 0 Thoughts that you would be better off dead, or of hurting yourself in some way: 0 PHQ-9 Total Score: 1 If you checked off any problems, how difficult have these problems made it for you to do your work, take care of things at home, or get along with other people?: Not difficult at all     Goals Addressed             This Visit's Progress    Patient Stated       Wants to keep sugar down and lose some weight  Objective:    Today's Vitals   06/20/24 0927  BP: 134/75  Pulse: 69  Weight: 171 lb (77.6 kg)  Height: 5' 7 (1.702 m)    Body mass index is 26.78 kg/m.  Hearing/Vision screen Hearing Screening - Comments:: No issues Vision Screening - Comments:: Glasses, [place] [facility], up to date Immunizations and Health Maintenance Health Maintenance  Topic Date Due   Lung Cancer Screening  Never done   Zoster Vaccines- Shingrix (2 of 2) 06/03/2024   Pneumococcal Vaccine: 50+ Years (3 of 3 - PCV20 or PCV21) 05/13/2025 (Originally 06/16/2019)   COVID-19 Vaccine (9 - Moderna risk 2025-26 season) 10/07/2024   Diabetic kidney evaluation - Urine ACR  11/03/2024   FOOT EXAM  11/03/2024   HEMOGLOBIN A1C  11/10/2024   OPHTHALMOLOGY EXAM  01/10/2025   Diabetic kidney evaluation - eGFR measurement  05/13/2025   Medicare Annual Wellness (AWV)  06/20/2025   Mammogram  11/26/2025   Cervical Cancer Screening (HPV/Pap Cotest)  01/27/2028   Colonoscopy  04/02/2032   DTaP/Tdap/Td (3 - Td or Tdap) 05/02/2034   Influenza Vaccine  Completed   Bone Density Scan  Completed   Hepatitis C Screening  Completed   HIV Screening  Completed   Hepatitis B Vaccines 19-59 Average Risk  Aged Out   Meningococcal B Vaccine  Aged Out        Assessment/Plan:  This is a routine wellness examination for [place].  Patient Care Team: Marylynn Verneita CROME, MD as PCP - General (Internal Medicine) Dessa Reyes ORN, MD (General Surgery) Marylynn Verneita CROME, MD (Internal Medicine) Jama, Cordella MATSU, MD (Vascular Surgery)  I have personally reviewed and noted the following in the patients chart:   Medical and social history Use of alcohol, tobacco or illicit drugs  Current medications and supplements including opioid prescriptions. Functional ability and status Nutritional status Physical activity Advanced directives List of other physicians Hospitalizations, surgeries, and ER visits in previous 12 months Vitals Screenings to include cognitive, depression, and falls Referrals and appointments  No orders of the defined types were placed in this  encounter.  In addition, I have reviewed and discussed with patient certain preventive protocols, quality metrics, and best practice recommendations. A written personalized care plan for preventive services as well as general preventive health recommendations were provided to patient.   Angeline Fredericks, LPN   [id number]/84/7974   Return in 1 year (on 06/20/2025).  After Visit Summary: (MyChart) Due to this being a telephonic visit, the after visit summary with patients personalized plan was offered to patient via MyChart   Nurse Notes: Patient stated that she plans on getting her 2nd shingles vaccine when it is due. Patient stated she had a lung cancer screening scheduled but had to cancel due to being in the hospital. Patient stated that she will call today and get the lung cancer screening rescheduled today. Patient has a repeat mammogram scheduled for 06/23/24.

## 2024-06-23 ENCOUNTER — Inpatient Hospital Stay: Admission: RE | Admit: 2024-06-23 | Discharge: 2024-06-23 | Attending: Internal Medicine | Admitting: Internal Medicine

## 2024-06-23 ENCOUNTER — Inpatient Hospital Stay: Admission: RE | Admit: 2024-06-23

## 2024-06-23 DIAGNOSIS — R928 Other abnormal and inconclusive findings on diagnostic imaging of breast: Secondary | ICD-10-CM | POA: Diagnosis not present

## 2024-06-23 DIAGNOSIS — N6320 Unspecified lump in the left breast, unspecified quadrant: Secondary | ICD-10-CM

## 2024-06-23 DIAGNOSIS — N6322 Unspecified lump in the left breast, upper inner quadrant: Secondary | ICD-10-CM | POA: Diagnosis present

## 2024-07-04 ENCOUNTER — Other Ambulatory Visit: Payer: Self-pay | Admitting: Internal Medicine

## 2024-07-11 NOTE — Progress Notes (Signed)
 Laura Reed                                          MRN: [id number]   07/11/2024   The [facility] Quality Team Specialist reviewed this patient medical record for the purposes of chart review for care gap closure. The following were reviewed: abstraction for care gap closure-glycemic status assessment.    VBCI Quality Team

## 2024-07-14 ENCOUNTER — Telehealth: Payer: Self-pay

## 2024-07-14 NOTE — Telephone Encounter (Signed)
 Copied from CRM #[id number]. 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 Office should be receiving letter from insurance.   Pt would like for office ETTER Raisin) to call insurance to verify everything should be same - pt doesn't understand why she is not qualified.   Pt states she needs her Sensor.   [id number] - insurance   [phone] - pt callback

## 2024-07-14 NOTE — Telephone Encounter (Signed)
 Spoke with pt to let her know that it was denied because they had not received her most recent office note yet, so I let her know that we faxed that this evening. Advised her to give them a call next week to follow up. Pt gave a verbal understanding.

## 2024-07-19 ENCOUNTER — Telehealth: Payer: Self-pay

## 2024-07-19 NOTE — Telephone Encounter (Signed)
 Copied from CRM [id number]. Topic: Clinical - Prescription Issue >> Jul 19, 2024 11:54 AM Laura Reed wrote: Reason for CRM: Patient called in stating [employer] Haven't gotten the fax, but would like to have another OV note sent due to being down to last sensor and would like to know if samples is available in office.

## 2024-07-19 NOTE — Telephone Encounter (Signed)
 Spoke with pt to let her know that she will need to follow up with [facility] because that is who we fax the office note to and then they run it through her insurance. Pt stated that she will give them a call either this evening or tomorrow. I let pt know that I have placed a sensor up front for her to pick up while we figure out what is going on with her insurance.

## 2024-07-20 NOTE — Telephone Encounter (Signed)
 Pt has pick up her sensor and also left a note for Harlene as well and it has been placed in back mail box.

## 2024-07-21 ENCOUNTER — Ambulatory Visit: Admitting: Internal Medicine

## 2024-07-26 MED ORDER — FREESTYLE LIBRE 2 PLUS SENSOR MISC: 0 refills | Status: DC

## 2024-07-26 NOTE — Addendum Note (Signed)
 Addended by: Mahamed Zalewski on: 07/26/2024 02:52 PM   Modules accepted: Orders

## 2024-07-26 NOTE — Telephone Encounter (Unsigned)
 Copied from CRM [id number]. Topic: Clinical - Prescription Issue >> Jul 26, 2024  9:06 AM Chasity T wrote: Reason for CRM: patient is calling requesting for dr marylynn to send over a prescription to pharmacy for freestyle libre plus sensor (2) and she will pay out of pocket for it while she is waiting on insurance approval.   [id number] [address] [place], [place] - [address] AT [facility] [address] [place] [address] Phone: [phone] Fax: [phone]

## 2024-07-26 NOTE — Telephone Encounter (Signed)
Sent to pharmacy and pt is aware

## 2024-08-09 ENCOUNTER — Other Ambulatory Visit (INDEPENDENT_AMBULATORY_CARE_PROVIDER_SITE_OTHER): Payer: Self-pay | Admitting: Vascular Surgery

## 2024-08-09 DIAGNOSIS — I70213 Atherosclerosis of native arteries of extremities with intermittent claudication, bilateral legs: Secondary | ICD-10-CM

## 2024-08-10 ENCOUNTER — Ambulatory Visit (INDEPENDENT_AMBULATORY_CARE_PROVIDER_SITE_OTHER): Admitting: Nurse Practitioner

## 2024-08-10 ENCOUNTER — Encounter (INDEPENDENT_AMBULATORY_CARE_PROVIDER_SITE_OTHER)

## 2024-08-11 ENCOUNTER — Ambulatory Visit: Admitting: Internal Medicine

## 2024-08-12 ENCOUNTER — Telehealth: Payer: Self-pay

## 2024-08-12 MED ORDER — GABAPENTIN 600 MG PO TABS: ORAL_TABLET | ORAL | 1 refills | Status: AC

## 2024-08-12 MED ORDER — FREESTYLE LIBRE 2 PLUS SENSOR MISC: 0 refills | Status: AC

## 2024-08-12 NOTE — Telephone Encounter (Signed)
 Copied from CRM #[id number]. Topic: General - Other >> Aug 12, 2024 10:43 AM Carlyon D wrote: Reason for CRM: Pt is calling in regards to a couple things she has questions about some of which are about prescriptions  and some stuff about other things so a little bit of everything pt would like miss jessica to call her back. [phone]

## 2024-08-12 NOTE — Addendum Note (Signed)
 Addended by: HARRIETTE RAISIN on: 08/12/2024 01:18 PM   Modules accepted: Orders

## 2024-08-12 NOTE — Telephone Encounter (Signed)
 Spoke with pt and she wanted to let us  know that she had to cancel her appt for yesterday due to the weather. She has rescheduled both her lab appt and follow up appt with Dr. Marylynn. She also wanted to see if we could refill her libre sensors and her gabapentin , both have been refilled.

## 2024-08-17 ENCOUNTER — Other Ambulatory Visit

## 2024-08-31 ENCOUNTER — Ambulatory Visit: Admitting: Internal Medicine

## 2024-09-09 ENCOUNTER — Ambulatory Visit: Admitting: Internal Medicine

## 2025-03-31 ENCOUNTER — Ambulatory Visit: Admitting: Urology

## 2025-06-26 ENCOUNTER — Ambulatory Visit
